# Patient Record
Sex: Female | Born: 1950 | Race: Asian | Hispanic: No | Marital: Married | Smoking: Former smoker
Health system: Southern US, Community
[De-identification: ages and names within clinical notes are randomized; demographics above are authoritative.]

## PROBLEM LIST (undated history)

## (undated) ENCOUNTER — Encounter

## (undated) ENCOUNTER — Telehealth

## (undated) ENCOUNTER — Ambulatory Visit: Payer: MEDICARE

## (undated) ENCOUNTER — Ambulatory Visit: Payer: Medicare (Managed Care)

## (undated) ENCOUNTER — Ambulatory Visit: Payer: MEDICARE | Attending: Nephrology | Primary: Nephrology

## (undated) ENCOUNTER — Ambulatory Visit

## (undated) ENCOUNTER — Other Ambulatory Visit

## (undated) ENCOUNTER — Encounter: Attending: Anesthesiology | Primary: Anesthesiology

## (undated) ENCOUNTER — Ambulatory Visit: Payer: MEDICARE | Attending: Registered" | Primary: Registered"

## (undated) ENCOUNTER — Encounter: Attending: Family | Primary: Family

## (undated) ENCOUNTER — Inpatient Hospital Stay

## (undated) ENCOUNTER — Ambulatory Visit: Payer: MEDICARE | Attending: Family | Primary: Family

## (undated) ENCOUNTER — Encounter: Attending: Internal Medicine | Primary: Internal Medicine

## (undated) ENCOUNTER — Ambulatory Visit: Payer: BLUE CROSS/BLUE SHIELD

## (undated) ENCOUNTER — Encounter: Attending: Surgery | Primary: Surgery

## (undated) ENCOUNTER — Ambulatory Visit: Attending: Nephrology | Primary: Nephrology

## (undated) ENCOUNTER — Ambulatory Visit: Attending: Surgery | Primary: Surgery

## (undated) ENCOUNTER — Encounter: Attending: Nephrology | Primary: Nephrology

## (undated) ENCOUNTER — Ambulatory Visit: Payer: MEDICARE | Attending: Surgery | Primary: Surgery

## (undated) ENCOUNTER — Encounter: Payer: MEDICARE | Attending: Internal Medicine | Primary: Internal Medicine

## (undated) DIAGNOSIS — E039 Hypothyroidism, unspecified: Secondary | ICD-10-CM

## (undated) DIAGNOSIS — I509 Heart failure, unspecified: Secondary | ICD-10-CM

## (undated) DIAGNOSIS — I2510 Atherosclerotic heart disease of native coronary artery without angina pectoris: Secondary | ICD-10-CM

## (undated) DIAGNOSIS — G629 Polyneuropathy, unspecified: Secondary | ICD-10-CM

## (undated) DIAGNOSIS — Z992 Dependence on renal dialysis: Secondary | ICD-10-CM

## (undated) DIAGNOSIS — Z9289 Personal history of other medical treatment: Secondary | ICD-10-CM

## (undated) DIAGNOSIS — I10 Essential (primary) hypertension: Secondary | ICD-10-CM

## (undated) DIAGNOSIS — B029 Zoster without complications: Secondary | ICD-10-CM

## (undated) DIAGNOSIS — Z9889 Other specified postprocedural states: Secondary | ICD-10-CM

## (undated) DIAGNOSIS — R251 Tremor, unspecified: Secondary | ICD-10-CM

## (undated) DIAGNOSIS — D649 Anemia, unspecified: Secondary | ICD-10-CM

## (undated) DIAGNOSIS — N189 Chronic kidney disease, unspecified: Secondary | ICD-10-CM

## (undated) DIAGNOSIS — E7800 Pure hypercholesterolemia, unspecified: Secondary | ICD-10-CM

## (undated) DIAGNOSIS — T82898A Other specified complication of vascular prosthetic devices, implants and grafts, initial encounter: Secondary | ICD-10-CM

## (undated) DIAGNOSIS — T8859XA Other complications of anesthesia, initial encounter: Secondary | ICD-10-CM

## (undated) DIAGNOSIS — R0600 Dyspnea, unspecified: Secondary | ICD-10-CM

## (undated) DIAGNOSIS — R112 Nausea with vomiting, unspecified: Secondary | ICD-10-CM

## (undated) DIAGNOSIS — E119 Type 2 diabetes mellitus without complications: Secondary | ICD-10-CM

## (undated) DIAGNOSIS — T4145XA Adverse effect of unspecified anesthetic, initial encounter: Secondary | ICD-10-CM

## (undated) DIAGNOSIS — R609 Edema, unspecified: Secondary | ICD-10-CM

## (undated) HISTORY — PX: ABDOMINAL HYSTERECTOMY: SHX81

## (undated) HISTORY — PX: TUBAL LIGATION: SHX77

## (undated) HISTORY — PX: CARDIAC CATHETERIZATION: SHX172

## (undated) HISTORY — PX: EYE SURGERY: SHX253

## (undated) HISTORY — PX: CORONARY ANGIOPLASTY: SHX604

## (undated) SURGERY — LEFT HEART CATH AND CORONARY ANGIOGRAPHY
Anesthesia: Moderate Sedation

---
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
## 2014-06-04 ENCOUNTER — Ambulatory Visit: Payer: Self-pay | Admitting: Internal Medicine

## 2014-08-06 DIAGNOSIS — E119 Type 2 diabetes mellitus without complications: Secondary | ICD-10-CM | POA: Diagnosis not present

## 2014-08-06 DIAGNOSIS — G63 Polyneuropathy in diseases classified elsewhere: Secondary | ICD-10-CM | POA: Diagnosis not present

## 2014-09-02 DIAGNOSIS — I10 Essential (primary) hypertension: Secondary | ICD-10-CM | POA: Diagnosis not present

## 2014-09-02 DIAGNOSIS — E782 Mixed hyperlipidemia: Secondary | ICD-10-CM | POA: Diagnosis not present

## 2014-09-02 DIAGNOSIS — E119 Type 2 diabetes mellitus without complications: Secondary | ICD-10-CM | POA: Diagnosis not present

## 2014-09-02 DIAGNOSIS — D6489 Other specified anemias: Secondary | ICD-10-CM | POA: Diagnosis not present

## 2014-09-09 DIAGNOSIS — I10 Essential (primary) hypertension: Secondary | ICD-10-CM | POA: Diagnosis not present

## 2014-09-09 DIAGNOSIS — E119 Type 2 diabetes mellitus without complications: Secondary | ICD-10-CM | POA: Diagnosis not present

## 2014-09-09 DIAGNOSIS — E782 Mixed hyperlipidemia: Secondary | ICD-10-CM | POA: Diagnosis not present

## 2014-10-02 DIAGNOSIS — D649 Anemia, unspecified: Secondary | ICD-10-CM | POA: Diagnosis not present

## 2014-10-29 ENCOUNTER — Ambulatory Visit
Admit: 2014-10-29 | Disposition: A | Discharge status: Home / Self Care | Payer: Self-pay | Attending: Gastroenterology | Admitting: Gastroenterology

## 2014-10-29 DIAGNOSIS — Z87891 Personal history of nicotine dependence: Secondary | ICD-10-CM | POA: Diagnosis not present

## 2014-10-29 DIAGNOSIS — D122 Benign neoplasm of ascending colon: Secondary | ICD-10-CM | POA: Diagnosis not present

## 2014-10-29 DIAGNOSIS — Z79899 Other long term (current) drug therapy: Secondary | ICD-10-CM | POA: Diagnosis not present

## 2014-10-29 DIAGNOSIS — Z90710 Acquired absence of both cervix and uterus: Secondary | ICD-10-CM | POA: Diagnosis not present

## 2014-10-29 DIAGNOSIS — D649 Anemia, unspecified: Secondary | ICD-10-CM | POA: Diagnosis not present

## 2014-10-29 DIAGNOSIS — E118 Type 2 diabetes mellitus with unspecified complications: Secondary | ICD-10-CM | POA: Diagnosis not present

## 2014-10-29 DIAGNOSIS — D125 Benign neoplasm of sigmoid colon: Secondary | ICD-10-CM | POA: Diagnosis not present

## 2014-10-29 DIAGNOSIS — D124 Benign neoplasm of descending colon: Secondary | ICD-10-CM | POA: Diagnosis not present

## 2014-10-29 DIAGNOSIS — K635 Polyp of colon: Secondary | ICD-10-CM | POA: Diagnosis not present

## 2014-10-29 DIAGNOSIS — Z880 Allergy status to penicillin: Secondary | ICD-10-CM | POA: Diagnosis not present

## 2014-11-16 LAB — SURGICAL PATHOLOGY

## 2014-11-24 DIAGNOSIS — E119 Type 2 diabetes mellitus without complications: Secondary | ICD-10-CM | POA: Diagnosis not present

## 2014-11-24 DIAGNOSIS — E782 Mixed hyperlipidemia: Secondary | ICD-10-CM | POA: Diagnosis not present

## 2014-11-24 DIAGNOSIS — I10 Essential (primary) hypertension: Secondary | ICD-10-CM | POA: Diagnosis not present

## 2014-12-01 DIAGNOSIS — E119 Type 2 diabetes mellitus without complications: Secondary | ICD-10-CM | POA: Diagnosis not present

## 2014-12-01 DIAGNOSIS — E782 Mixed hyperlipidemia: Secondary | ICD-10-CM | POA: Diagnosis not present

## 2014-12-01 DIAGNOSIS — I10 Essential (primary) hypertension: Secondary | ICD-10-CM | POA: Diagnosis not present

## 2015-03-31 DIAGNOSIS — I10 Essential (primary) hypertension: Secondary | ICD-10-CM | POA: Diagnosis not present

## 2015-03-31 DIAGNOSIS — E782 Mixed hyperlipidemia: Secondary | ICD-10-CM | POA: Diagnosis not present

## 2015-03-31 DIAGNOSIS — E119 Type 2 diabetes mellitus without complications: Secondary | ICD-10-CM | POA: Diagnosis not present

## 2015-04-05 DIAGNOSIS — I10 Essential (primary) hypertension: Secondary | ICD-10-CM | POA: Diagnosis not present

## 2015-04-05 DIAGNOSIS — Z0000 Encounter for general adult medical examination without abnormal findings: Secondary | ICD-10-CM | POA: Diagnosis not present

## 2015-04-05 DIAGNOSIS — N183 Chronic kidney disease, stage 3 (moderate): Secondary | ICD-10-CM | POA: Diagnosis not present

## 2015-04-05 DIAGNOSIS — E119 Type 2 diabetes mellitus without complications: Secondary | ICD-10-CM | POA: Diagnosis not present

## 2015-05-03 DIAGNOSIS — N184 Chronic kidney disease, stage 4 (severe): Secondary | ICD-10-CM | POA: Diagnosis not present

## 2015-05-03 DIAGNOSIS — I10 Essential (primary) hypertension: Secondary | ICD-10-CM | POA: Diagnosis not present

## 2015-05-03 DIAGNOSIS — E785 Hyperlipidemia, unspecified: Secondary | ICD-10-CM | POA: Diagnosis not present

## 2015-05-03 DIAGNOSIS — E1122 Type 2 diabetes mellitus with diabetic chronic kidney disease: Secondary | ICD-10-CM | POA: Diagnosis not present

## 2015-05-03 DIAGNOSIS — D631 Anemia in chronic kidney disease: Secondary | ICD-10-CM | POA: Diagnosis not present

## 2015-05-05 ENCOUNTER — Other Ambulatory Visit: Payer: Self-pay | Admitting: Nephrology

## 2015-05-05 DIAGNOSIS — N184 Chronic kidney disease, stage 4 (severe): Secondary | ICD-10-CM

## 2015-05-11 ENCOUNTER — Ambulatory Visit
Admission: RE | Admit: 2015-05-11 | Discharge: 2015-05-11 | Disposition: A | Discharge status: Home / Self Care | Payer: Commercial Managed Care - HMO | Source: Ambulatory Visit | Attending: Nephrology | Admitting: Nephrology

## 2015-05-11 DIAGNOSIS — N184 Chronic kidney disease, stage 4 (severe): Secondary | ICD-10-CM | POA: Diagnosis not present

## 2015-05-11 DIAGNOSIS — N189 Chronic kidney disease, unspecified: Secondary | ICD-10-CM | POA: Diagnosis not present

## 2015-05-17 DIAGNOSIS — D631 Anemia in chronic kidney disease: Secondary | ICD-10-CM | POA: Diagnosis not present

## 2015-05-17 DIAGNOSIS — R809 Proteinuria, unspecified: Secondary | ICD-10-CM | POA: Diagnosis not present

## 2015-05-17 DIAGNOSIS — I10 Essential (primary) hypertension: Secondary | ICD-10-CM | POA: Diagnosis not present

## 2015-05-17 DIAGNOSIS — E1122 Type 2 diabetes mellitus with diabetic chronic kidney disease: Secondary | ICD-10-CM | POA: Diagnosis not present

## 2015-05-17 DIAGNOSIS — N183 Chronic kidney disease, stage 3 (moderate): Secondary | ICD-10-CM | POA: Diagnosis not present

## 2015-05-24 ENCOUNTER — Other Ambulatory Visit: Payer: Self-pay | Admitting: Nephrology

## 2015-05-24 DIAGNOSIS — N184 Chronic kidney disease, stage 4 (severe): Secondary | ICD-10-CM

## 2015-05-25 ENCOUNTER — Observation Stay
Admission: AD | Admit: 2015-05-25 | Discharge: 2015-05-26 | Disposition: A | Discharge status: Home / Self Care | Payer: Commercial Managed Care - HMO | Source: Ambulatory Visit | Attending: Nephrology | Admitting: Nephrology

## 2015-05-25 DIAGNOSIS — E785 Hyperlipidemia, unspecified: Secondary | ICD-10-CM | POA: Insufficient documentation

## 2015-05-25 DIAGNOSIS — Z87891 Personal history of nicotine dependence: Secondary | ICD-10-CM | POA: Diagnosis not present

## 2015-05-25 DIAGNOSIS — Z880 Allergy status to penicillin: Secondary | ICD-10-CM | POA: Diagnosis not present

## 2015-05-25 DIAGNOSIS — I129 Hypertensive chronic kidney disease with stage 1 through stage 4 chronic kidney disease, or unspecified chronic kidney disease: Secondary | ICD-10-CM | POA: Insufficient documentation

## 2015-05-25 DIAGNOSIS — E1140 Type 2 diabetes mellitus with diabetic neuropathy, unspecified: Secondary | ICD-10-CM | POA: Insufficient documentation

## 2015-05-25 DIAGNOSIS — N179 Acute kidney failure, unspecified: Secondary | ICD-10-CM | POA: Diagnosis not present

## 2015-05-25 DIAGNOSIS — Z7982 Long term (current) use of aspirin: Secondary | ICD-10-CM | POA: Insufficient documentation

## 2015-05-25 DIAGNOSIS — Z794 Long term (current) use of insulin: Secondary | ICD-10-CM | POA: Insufficient documentation

## 2015-05-25 DIAGNOSIS — E1122 Type 2 diabetes mellitus with diabetic chronic kidney disease: Secondary | ICD-10-CM | POA: Diagnosis not present

## 2015-05-25 DIAGNOSIS — R319 Hematuria, unspecified: Secondary | ICD-10-CM | POA: Insufficient documentation

## 2015-05-25 DIAGNOSIS — Z888 Allergy status to other drugs, medicaments and biological substances status: Secondary | ICD-10-CM | POA: Diagnosis not present

## 2015-05-25 DIAGNOSIS — R809 Proteinuria, unspecified: Secondary | ICD-10-CM

## 2015-05-25 DIAGNOSIS — Z90710 Acquired absence of both cervix and uterus: Secondary | ICD-10-CM | POA: Diagnosis not present

## 2015-05-25 DIAGNOSIS — N184 Chronic kidney disease, stage 4 (severe): Secondary | ICD-10-CM | POA: Insufficient documentation

## 2015-05-25 DIAGNOSIS — E559 Vitamin D deficiency, unspecified: Secondary | ICD-10-CM | POA: Insufficient documentation

## 2015-05-25 DIAGNOSIS — I10 Essential (primary) hypertension: Secondary | ICD-10-CM | POA: Diagnosis not present

## 2015-05-25 DIAGNOSIS — N183 Chronic kidney disease, stage 3 (moderate): Secondary | ICD-10-CM | POA: Diagnosis not present

## 2015-05-25 DIAGNOSIS — Z79899 Other long term (current) drug therapy: Secondary | ICD-10-CM | POA: Diagnosis not present

## 2015-05-25 DIAGNOSIS — N189 Chronic kidney disease, unspecified: Secondary | ICD-10-CM | POA: Diagnosis not present

## 2015-05-25 DIAGNOSIS — D631 Anemia in chronic kidney disease: Secondary | ICD-10-CM | POA: Diagnosis not present

## 2015-05-25 DIAGNOSIS — Z23 Encounter for immunization: Secondary | ICD-10-CM | POA: Diagnosis not present

## 2015-05-25 HISTORY — DX: Type 2 diabetes mellitus without complications: E11.9

## 2015-05-25 HISTORY — DX: Chronic kidney disease, unspecified: N18.9

## 2015-05-25 LAB — COMPREHENSIVE METABOLIC PANEL
ALK PHOS: 77 U/L (ref 38–126)
ALT: 20 U/L (ref 14–54)
AST: 18 U/L (ref 15–41)
Albumin: 3 g/dL — ABNORMAL LOW (ref 3.5–5.0)
Anion gap: 6 (ref 5–15)
BILIRUBIN TOTAL: 0.3 mg/dL (ref 0.3–1.2)
BUN: 33 mg/dL — AB (ref 6–20)
CALCIUM: 9.5 mg/dL (ref 8.9–10.3)
CO2: 26 mmol/L (ref 22–32)
CREATININE: 2.47 mg/dL — AB (ref 0.44–1.00)
Chloride: 105 mmol/L (ref 101–111)
GFR calc Af Amer: 22 mL/min — ABNORMAL LOW (ref >60)
GFR calc non Af Amer: 19 mL/min — ABNORMAL LOW (ref >60)
Glucose, Bld: 281 mg/dL — ABNORMAL HIGH (ref 65–99)
Potassium: 4.6 mmol/L (ref 3.5–5.1)
Sodium: 137 mmol/L (ref 135–145)
Total Protein: 6.2 g/dL — ABNORMAL LOW (ref 6.5–8.1)

## 2015-05-25 LAB — URINALYSIS COMPLETE WITH MICROSCOPIC (ARMC ONLY)
Bilirubin Urine: NEGATIVE
Glucose, UA: >500 mg/dL — AB
Ketones, ur: NEGATIVE mg/dL
LEUKOCYTES UA: NEGATIVE
Nitrite: NEGATIVE
PROTEIN: 100 mg/dL — AB
SPECIFIC GRAVITY, URINE: 1.005 (ref 1.005–1.030)
pH: 6 (ref 5.0–8.0)

## 2015-05-25 LAB — PROTEIN / CREATININE RATIO, URINE
Creatinine, Urine: 22 mg/dL
Protein Creatinine Ratio: 8.59 mg/mg{Cre} — ABNORMAL HIGH (ref 0.00–0.15)
Total Protein, Urine: 189 mg/dL

## 2015-05-25 LAB — GLUCOSE, CAPILLARY
Glucose-Capillary: 233 mg/dL — ABNORMAL HIGH (ref 65–99)
Glucose-Capillary: 125 mg/dL — ABNORMAL HIGH (ref 65–99)
Glucose-Capillary: 252 mg/dL — ABNORMAL HIGH (ref 65–99)

## 2015-05-25 LAB — CBC WITH DIFFERENTIAL/PLATELET
BASOS PCT: 1 %
Basophils Absolute: 0 10*3/uL (ref 0–0.1)
EOS PCT: 2 %
Eosinophils Absolute: 0.1 10*3/uL (ref 0–0.7)
HCT: 30.1 % — ABNORMAL LOW (ref 35.0–47.0)
Hemoglobin: 10.3 g/dL — ABNORMAL LOW (ref 12.0–16.0)
Lymphocytes Relative: 24 %
Lymphs Abs: 1.5 10*3/uL (ref 1.0–3.6)
MCH: 29.6 pg (ref 26.0–34.0)
MCHC: 34.4 g/dL (ref 32.0–36.0)
MCV: 86.1 fL (ref 80.0–100.0)
MONO ABS: 0.3 10*3/uL (ref 0.2–0.9)
MONOS PCT: 5 %
NEUTROS ABS: 4.3 10*3/uL (ref 1.4–6.5)
Neutrophils Relative %: 68 %
Platelets: 203 10*3/uL (ref 150–440)
RBC: 3.49 MIL/uL — ABNORMAL LOW (ref 3.80–5.20)
RDW: 14 % (ref 11.5–14.5)
WBC: 6.3 10*3/uL (ref 3.6–11.0)

## 2015-05-25 LAB — PROTIME-INR
INR: 0.89
PROTHROMBIN TIME: 12.3 s (ref 11.4–15.0)

## 2015-05-25 LAB — TYPE AND SCREEN
ABO/RH(D): A POS
ANTIBODY SCREEN: NEGATIVE

## 2015-05-25 LAB — ABO/RH: ABO/RH(D): A POS

## 2015-05-25 MED ORDER — FENTANYL CITRATE (PF) 100 MCG/2ML IJ SOLN
INTRAMUSCULAR | Status: AC | PRN
  Administered 2015-05-25: 50 ug via INTRAVENOUS

## 2015-05-25 MED ORDER — HYDROCODONE-ACETAMINOPHEN 5-325 MG PO TABS: 1.0000 | ORAL_TABLET | ORAL | Status: DC | PRN

## 2015-05-25 MED ORDER — INSULIN ASPART 100 UNIT/ML ~~LOC~~ SOLN
0.0000 [IU] | Freq: Every day | SUBCUTANEOUS | Status: DC
  Administered 2015-05-25: 3 [IU] via SUBCUTANEOUS
  Filled 2015-05-25: qty 3

## 2015-05-25 MED ORDER — INFLUENZA VAC SPLIT QUAD 0.5 ML IM SUSY
0.5000 mL | PREFILLED_SYRINGE | INTRAMUSCULAR | Status: AC
  Administered 2015-05-26: 0.5 mL via INTRAMUSCULAR
  Filled 2015-05-25: qty 0.5

## 2015-05-25 MED ORDER — HYDRALAZINE HCL 20 MG/ML IJ SOLN
INTRAMUSCULAR | Status: AC
Start: 2015-05-25 — End: 2015-05-25
  Administered 2015-05-25: 17:00:00
  Filled 2015-05-25: qty 2

## 2015-05-25 MED ORDER — MIDAZOLAM HCL 5 MG/5ML IJ SOLN
INTRAMUSCULAR | Status: AC
  Administered 2015-05-25: 17:00:00
  Filled 2015-05-25: qty 5

## 2015-05-25 MED ORDER — MIDAZOLAM HCL 5 MG/5ML IJ SOLN
INTRAMUSCULAR | Status: AC | PRN
  Administered 2015-05-25: 1 mg via INTRAVENOUS

## 2015-05-25 MED ORDER — SODIUM CHLORIDE 0.9 % IV SOLN
INTRAVENOUS | Status: DC
Start: 2015-05-25 — End: 2015-05-26
  Administered 2015-05-25: 11:00:00 via INTRAVENOUS

## 2015-05-25 MED ORDER — HYDRALAZINE HCL 20 MG/ML IJ SOLN
10.0000 mg | INTRAMUSCULAR | Status: DC | PRN
  Administered 2015-05-25: 10 mg via INTRAVENOUS

## 2015-05-25 MED ORDER — INSULIN ASPART 100 UNIT/ML ~~LOC~~ SOLN
0.0000 [IU] | Freq: Three times a day (TID) | SUBCUTANEOUS | Status: DC
  Administered 2015-05-25: 3 [IU] via SUBCUTANEOUS
  Administered 2015-05-25: 1 [IU] via SUBCUTANEOUS
  Administered 2015-05-26: 2 [IU] via SUBCUTANEOUS
  Filled 2015-05-25: qty 1
  Filled 2015-05-25 (×2): qty 2

## 2015-05-25 MED ORDER — FENTANYL CITRATE (PF) 100 MCG/2ML IJ SOLN
INTRAMUSCULAR | Status: AC
  Administered 2015-05-25: 17:00:00
  Filled 2015-05-25: qty 4

## 2015-05-25 MED ORDER — HYDRALAZINE HCL 25 MG PO TABS: 25.0000 mg | ORAL_TABLET | Freq: Once | ORAL | Status: DC

## 2015-05-25 MED ORDER — HYDRALAZINE HCL 20 MG/ML IJ SOLN
10.0000 mg | Freq: Once | INTRAMUSCULAR | Status: AC
  Administered 2015-05-25: 10 mg via INTRAVENOUS

## 2015-05-25 NOTE — Progress Notes (Signed)
Nurse report called to nurse emily 2C, pt sleepy arousable gcs 15, left flank w/bandaide no swelling or bleeding, pt prone position denies pain, vss PIV left hand

## 2015-05-25 NOTE — Progress Notes (Signed)
Dr. Juleen China notified of diabetes and recent uncontrolled blood sugars. ACHS blood sugar checks and low sliding scale ordered. Will continue to monitor,  Almedia Balls, RN

## 2015-05-25 NOTE — Progress Notes (Addendum)
  Subjective:  Patient presents for U.S guided Kidney biopsy Going well No acute c/o    Objective:  Vital signs in last 24 hours:  Temp:  [98.4 F (36.9 C)] 98.4 F (36.9 C) (11/01 0936) Pulse Rate:  [77] 77 (11/01 1059) Resp:  [14] 14 (11/01 0936) BP: (148-180)/(74-83) 148/83 mmHg (11/01 1059) SpO2:  [100 %] 100 % (11/01 0936) Weight:  [66.543 kg (146 lb 11.2 oz)] 66.543 kg (146 lb 11.2 oz) (11/01 1105)  Weight change:  Filed Weights   05/25/15 1105  Weight: 66.543 kg (146 lb 11.2 oz)    Intake/Output:    Intake/Output Summary (Last 24 hours) at 05/25/15 1240 Last data filed at 05/25/15 1157  Gross per 24 hour  Intake      0 ml  Output    175 ml  Net   -175 ml     Physical Exam: General: NAD, laying in bed  HEENT Anicteric, moist oral mucus membranes  Neck supple  Pulm/lungs Normal effort, mild basilar rhonchi  CVS/Heart Regular, soft systolic murmur  Abdomen:  Soft, Nen tender  Extremities: + peripheral edema  Neurologic: Alert, oriented  Skin: No acute rashes  Access:        Basic Metabolic Panel:   Recent Labs Lab 05/25/15 1008  NA 137  K 4.6  CL 105  CO2 26  GLUCOSE 281*  BUN 33*  CREATININE 2.47*  CALCIUM 9.5     CBC:  Recent Labs Lab 05/25/15 1008  WBC 6.3  NEUTROABS 4.3  HGB 10.3*  HCT 30.1*  MCV 86.1  PLT 203      Microbiology:  No results found for this or any previous visit (from the past 720 hour(s)).  Coagulation Studies:  Recent Labs  05/25/15 1008  LABPROT 12.3  INR 0.89    Urinalysis:  Recent Labs  05/25/15 1155  COLORURINE STRAW*  LABSPEC 1.005  PHURINE 6.0  GLUCOSEU >500*  HGBUR 1+*  BILIRUBINUR NEGATIVE  KETONESUR NEGATIVE  PROTEINUR 100*  NITRITE NEGATIVE  LEUKOCYTESUR NEGATIVE      Imaging: No results found.   Medications:   . sodium chloride 10 mL/hr at 05/25/15 1100   . insulin aspart  0-5 Units Subcutaneous QHS  . insulin aspart  0-9 Units Subcutaneous TID WC    hydrALAZINE  Assessment/ Plan:  64 y.o. Asian ([place]) female with diabetes mellitus 2, diabetic neuropathy, anemia, hypertension, hyperlipidemia with acute renal failure on chronic kidney disease stage III with nephrotic range proteinuria and hematuria.   Plan: Kidney biopsy today (radiologist) Hydralazine x 1 to improve BP control     LOS:  Bijon Mineer 11/1/201612:40 PM

## 2015-05-25 NOTE — Procedures (Signed)
Interventional Radiology Procedure Note  Procedure:  Ultrasound guided core biopsy of left kidney  Complications:  None  Estimated Blood Loss: < 10 mL  16 G core biopsy x 3 of LP of left kidney.  No immediate complications.  Venetia Night. Kathlene Cote, M.D Pager:  [phone]

## 2015-05-25 NOTE — Sedation Documentation (Signed)
BP 187/88. Hydralazine 10 mg IV given.  Dr. Kathlene Cote notified.

## 2015-05-26 DIAGNOSIS — E1140 Type 2 diabetes mellitus with diabetic neuropathy, unspecified: Secondary | ICD-10-CM | POA: Diagnosis not present

## 2015-05-26 DIAGNOSIS — N184 Chronic kidney disease, stage 4 (severe): Secondary | ICD-10-CM | POA: Diagnosis not present

## 2015-05-26 DIAGNOSIS — E785 Hyperlipidemia, unspecified: Secondary | ICD-10-CM | POA: Diagnosis not present

## 2015-05-26 DIAGNOSIS — D631 Anemia in chronic kidney disease: Secondary | ICD-10-CM | POA: Diagnosis not present

## 2015-05-26 DIAGNOSIS — E1122 Type 2 diabetes mellitus with diabetic chronic kidney disease: Secondary | ICD-10-CM | POA: Diagnosis not present

## 2015-05-26 DIAGNOSIS — N179 Acute kidney failure, unspecified: Secondary | ICD-10-CM | POA: Diagnosis not present

## 2015-05-26 DIAGNOSIS — I129 Hypertensive chronic kidney disease with stage 1 through stage 4 chronic kidney disease, or unspecified chronic kidney disease: Secondary | ICD-10-CM | POA: Diagnosis not present

## 2015-05-26 DIAGNOSIS — Z87891 Personal history of nicotine dependence: Secondary | ICD-10-CM | POA: Diagnosis not present

## 2015-05-26 DIAGNOSIS — Z794 Long term (current) use of insulin: Secondary | ICD-10-CM | POA: Diagnosis not present

## 2015-05-26 DIAGNOSIS — R809 Proteinuria, unspecified: Secondary | ICD-10-CM | POA: Diagnosis not present

## 2015-05-26 LAB — HEMOGLOBIN: HEMOGLOBIN: 9.1 g/dL — AB (ref 12.0–16.0)

## 2015-05-26 LAB — GLUCOSE, CAPILLARY: Glucose-Capillary: 176 mg/dL — ABNORMAL HIGH (ref 65–99)

## 2015-05-26 MED ORDER — FUROSEMIDE 40 MG PO TABS: 40.0000 mg | ORAL_TABLET | Freq: Two times a day (BID) | ORAL | Status: DC

## 2015-05-26 MED ORDER — ASPIRIN EC 81 MG PO TBEC: 81.0000 mg | DELAYED_RELEASE_TABLET | Freq: Every day | ORAL | Status: AC

## 2015-05-26 NOTE — Discharge Instructions (Signed)
No Lifting over 10 lbs for 10 days RESUME aspirin after NOV 15 May take Tylenol for pain

## 2015-05-26 NOTE — Discharge Summary (Signed)
  Patient underwent Kidney Biopsy yesterday Doing well today No c/o No pain No blood in urine  VS: 136/49  71  82  16  Room air 95% Gen: NAD Heent: anicteric moist mucus membranes Cvs: regular, no rub pulm: normal effort, clear Abdo: aoft, non tender Extr: + edema Neuro: A & O  Labs reviewed  A/P 1. Proteinuria 2. ARF 3. CKD st 3 4. DM-2 with CKD 5. Peripheral edema  Plan: F/u outpatient for Biopsy results We had a long discussion about dietary restrictions  Patient's husband reports that she is not compliant with Diet She was on invokana 2 yrs ago. Stopped about a year ago D/c home

## 2015-06-07 LAB — SURGICAL PATHOLOGY

## 2015-06-08 ENCOUNTER — Encounter: Payer: Self-pay | Admitting: Nephrology

## 2015-06-15 DIAGNOSIS — N184 Chronic kidney disease, stage 4 (severe): Secondary | ICD-10-CM | POA: Diagnosis not present

## 2015-06-15 DIAGNOSIS — R809 Proteinuria, unspecified: Secondary | ICD-10-CM | POA: Diagnosis not present

## 2015-06-15 DIAGNOSIS — I10 Essential (primary) hypertension: Secondary | ICD-10-CM | POA: Diagnosis not present

## 2015-06-15 DIAGNOSIS — E1122 Type 2 diabetes mellitus with diabetic chronic kidney disease: Secondary | ICD-10-CM | POA: Diagnosis not present

## 2015-06-16 DIAGNOSIS — E113493 Type 2 diabetes mellitus with severe nonproliferative diabetic retinopathy without macular edema, bilateral: Secondary | ICD-10-CM | POA: Diagnosis not present

## 2015-06-28 DIAGNOSIS — E785 Hyperlipidemia, unspecified: Secondary | ICD-10-CM | POA: Diagnosis not present

## 2015-06-28 DIAGNOSIS — I10 Essential (primary) hypertension: Secondary | ICD-10-CM | POA: Diagnosis not present

## 2015-06-28 DIAGNOSIS — E1122 Type 2 diabetes mellitus with diabetic chronic kidney disease: Secondary | ICD-10-CM | POA: Diagnosis not present

## 2015-06-28 DIAGNOSIS — R809 Proteinuria, unspecified: Secondary | ICD-10-CM | POA: Diagnosis not present

## 2015-06-28 DIAGNOSIS — N184 Chronic kidney disease, stage 4 (severe): Secondary | ICD-10-CM | POA: Diagnosis not present

## 2015-06-28 DIAGNOSIS — D631 Anemia in chronic kidney disease: Secondary | ICD-10-CM | POA: Diagnosis not present

## 2015-07-02 DIAGNOSIS — N183 Chronic kidney disease, stage 3 (moderate): Secondary | ICD-10-CM | POA: Diagnosis not present

## 2015-07-02 DIAGNOSIS — I10 Essential (primary) hypertension: Secondary | ICD-10-CM | POA: Diagnosis not present

## 2015-07-02 DIAGNOSIS — Z0000 Encounter for general adult medical examination without abnormal findings: Secondary | ICD-10-CM | POA: Diagnosis not present

## 2015-07-02 DIAGNOSIS — Z794 Long term (current) use of insulin: Secondary | ICD-10-CM | POA: Diagnosis not present

## 2015-07-02 DIAGNOSIS — E119 Type 2 diabetes mellitus without complications: Secondary | ICD-10-CM | POA: Diagnosis not present

## 2015-07-09 DIAGNOSIS — Z23 Encounter for immunization: Secondary | ICD-10-CM | POA: Diagnosis not present

## 2015-07-09 DIAGNOSIS — E1165 Type 2 diabetes mellitus with hyperglycemia: Secondary | ICD-10-CM | POA: Diagnosis not present

## 2015-07-09 DIAGNOSIS — L298 Other pruritus: Secondary | ICD-10-CM | POA: Diagnosis not present

## 2015-07-09 DIAGNOSIS — N185 Chronic kidney disease, stage 5: Secondary | ICD-10-CM | POA: Insufficient documentation

## 2015-07-09 DIAGNOSIS — N184 Chronic kidney disease, stage 4 (severe): Secondary | ICD-10-CM | POA: Diagnosis not present

## 2015-07-09 DIAGNOSIS — E782 Mixed hyperlipidemia: Secondary | ICD-10-CM | POA: Diagnosis not present

## 2015-07-09 DIAGNOSIS — E118 Type 2 diabetes mellitus with unspecified complications: Secondary | ICD-10-CM | POA: Diagnosis not present

## 2015-07-09 DIAGNOSIS — Z794 Long term (current) use of insulin: Secondary | ICD-10-CM | POA: Diagnosis not present

## 2015-07-12 DIAGNOSIS — N184 Chronic kidney disease, stage 4 (severe): Secondary | ICD-10-CM | POA: Diagnosis not present

## 2015-07-12 DIAGNOSIS — R809 Proteinuria, unspecified: Secondary | ICD-10-CM | POA: Diagnosis not present

## 2015-07-12 DIAGNOSIS — I129 Hypertensive chronic kidney disease with stage 1 through stage 4 chronic kidney disease, or unspecified chronic kidney disease: Secondary | ICD-10-CM | POA: Diagnosis not present

## 2015-07-12 DIAGNOSIS — E1122 Type 2 diabetes mellitus with diabetic chronic kidney disease: Secondary | ICD-10-CM | POA: Diagnosis not present

## 2015-08-09 DIAGNOSIS — E113492 Type 2 diabetes mellitus with severe nonproliferative diabetic retinopathy without macular edema, left eye: Secondary | ICD-10-CM | POA: Diagnosis not present

## 2015-08-09 DIAGNOSIS — E113591 Type 2 diabetes mellitus with proliferative diabetic retinopathy without macular edema, right eye: Secondary | ICD-10-CM | POA: Diagnosis not present

## 2015-08-30 DIAGNOSIS — N184 Chronic kidney disease, stage 4 (severe): Secondary | ICD-10-CM | POA: Diagnosis not present

## 2015-08-30 DIAGNOSIS — D631 Anemia in chronic kidney disease: Secondary | ICD-10-CM | POA: Diagnosis not present

## 2015-08-30 DIAGNOSIS — E1122 Type 2 diabetes mellitus with diabetic chronic kidney disease: Secondary | ICD-10-CM | POA: Diagnosis not present

## 2015-08-30 DIAGNOSIS — R809 Proteinuria, unspecified: Secondary | ICD-10-CM | POA: Diagnosis not present

## 2015-08-30 DIAGNOSIS — I129 Hypertensive chronic kidney disease with stage 1 through stage 4 chronic kidney disease, or unspecified chronic kidney disease: Secondary | ICD-10-CM | POA: Diagnosis not present

## 2015-09-01 DIAGNOSIS — E113591 Type 2 diabetes mellitus with proliferative diabetic retinopathy without macular edema, right eye: Secondary | ICD-10-CM | POA: Diagnosis not present

## 2015-09-13 DIAGNOSIS — E663 Overweight: Secondary | ICD-10-CM | POA: Diagnosis not present

## 2015-09-13 DIAGNOSIS — E782 Mixed hyperlipidemia: Secondary | ICD-10-CM | POA: Diagnosis not present

## 2015-09-13 DIAGNOSIS — E113412 Type 2 diabetes mellitus with severe nonproliferative diabetic retinopathy with macular edema, left eye: Secondary | ICD-10-CM | POA: Diagnosis not present

## 2015-09-13 DIAGNOSIS — E1121 Type 2 diabetes mellitus with diabetic nephropathy: Secondary | ICD-10-CM | POA: Diagnosis not present

## 2015-09-13 DIAGNOSIS — E1142 Type 2 diabetes mellitus with diabetic polyneuropathy: Secondary | ICD-10-CM | POA: Insufficient documentation

## 2015-09-13 DIAGNOSIS — Z794 Long term (current) use of insulin: Secondary | ICD-10-CM

## 2015-09-13 DIAGNOSIS — E1165 Type 2 diabetes mellitus with hyperglycemia: Secondary | ICD-10-CM | POA: Diagnosis not present

## 2015-09-16 ENCOUNTER — Inpatient Hospital Stay: Payer: Commercial Managed Care - HMO | Admitting: Oncology

## 2015-09-23 DIAGNOSIS — E782 Mixed hyperlipidemia: Secondary | ICD-10-CM | POA: Diagnosis not present

## 2015-09-23 DIAGNOSIS — Z794 Long term (current) use of insulin: Secondary | ICD-10-CM | POA: Diagnosis not present

## 2015-09-23 DIAGNOSIS — E118 Type 2 diabetes mellitus with unspecified complications: Secondary | ICD-10-CM | POA: Diagnosis not present

## 2015-09-23 DIAGNOSIS — Z23 Encounter for immunization: Secondary | ICD-10-CM | POA: Diagnosis not present

## 2015-09-23 DIAGNOSIS — E1165 Type 2 diabetes mellitus with hyperglycemia: Secondary | ICD-10-CM | POA: Diagnosis not present

## 2015-09-23 DIAGNOSIS — L298 Other pruritus: Secondary | ICD-10-CM | POA: Diagnosis not present

## 2015-09-23 DIAGNOSIS — N185 Chronic kidney disease, stage 5: Secondary | ICD-10-CM | POA: Diagnosis not present

## 2015-09-30 DIAGNOSIS — Z794 Long term (current) use of insulin: Secondary | ICD-10-CM | POA: Diagnosis not present

## 2015-09-30 DIAGNOSIS — I10 Essential (primary) hypertension: Secondary | ICD-10-CM | POA: Diagnosis not present

## 2015-09-30 DIAGNOSIS — N185 Chronic kidney disease, stage 5: Secondary | ICD-10-CM | POA: Diagnosis not present

## 2015-09-30 DIAGNOSIS — E1122 Type 2 diabetes mellitus with diabetic chronic kidney disease: Secondary | ICD-10-CM | POA: Diagnosis not present

## 2015-09-30 DIAGNOSIS — G63 Polyneuropathy in diseases classified elsewhere: Secondary | ICD-10-CM | POA: Diagnosis not present

## 2015-10-04 ENCOUNTER — Inpatient Hospital Stay: Payer: Commercial Managed Care - HMO | Attending: Oncology | Admitting: Oncology

## 2015-10-04 ENCOUNTER — Inpatient Hospital Stay: Payer: Commercial Managed Care - HMO

## 2015-10-04 VITALS — BP 159/76 | HR 83 | Temp 98.1°F | Resp 16 | Wt 148.6 lb

## 2015-10-04 DIAGNOSIS — D649 Anemia, unspecified: Secondary | ICD-10-CM

## 2015-10-04 DIAGNOSIS — Z79899 Other long term (current) drug therapy: Secondary | ICD-10-CM

## 2015-10-04 DIAGNOSIS — I129 Hypertensive chronic kidney disease with stage 1 through stage 4 chronic kidney disease, or unspecified chronic kidney disease: Secondary | ICD-10-CM

## 2015-10-04 DIAGNOSIS — D631 Anemia in chronic kidney disease: Secondary | ICD-10-CM | POA: Diagnosis not present

## 2015-10-04 DIAGNOSIS — Z87891 Personal history of nicotine dependence: Secondary | ICD-10-CM | POA: Diagnosis not present

## 2015-10-04 DIAGNOSIS — E1122 Type 2 diabetes mellitus with diabetic chronic kidney disease: Secondary | ICD-10-CM

## 2015-10-04 DIAGNOSIS — Z794 Long term (current) use of insulin: Secondary | ICD-10-CM

## 2015-10-04 DIAGNOSIS — N189 Chronic kidney disease, unspecified: Secondary | ICD-10-CM | POA: Diagnosis not present

## 2015-10-04 DIAGNOSIS — Z7982 Long term (current) use of aspirin: Secondary | ICD-10-CM

## 2015-10-04 LAB — IRON AND TIBC
IRON: 51 ug/dL (ref 28–170)
Saturation Ratios: 17 % (ref 10.4–31.8)
TIBC: 304 ug/dL (ref 250–450)
UIBC: 253 ug/dL

## 2015-10-04 LAB — CBC WITH DIFFERENTIAL/PLATELET
BASOS PCT: 1 %
Basophils Absolute: 0 10*3/uL (ref 0–0.1)
EOS ABS: 0.1 10*3/uL (ref 0–0.7)
EOS PCT: 2 %
HCT: 27.7 % — ABNORMAL LOW (ref 35.0–47.0)
HEMOGLOBIN: 9.4 g/dL — AB (ref 12.0–16.0)
Lymphocytes Relative: 23 %
Lymphs Abs: 1.4 10*3/uL (ref 1.0–3.6)
MCH: 29.3 pg (ref 26.0–34.0)
MCHC: 34 g/dL (ref 32.0–36.0)
MCV: 86 fL (ref 80.0–100.0)
MONOS PCT: 6 %
Monocytes Absolute: 0.4 10*3/uL (ref 0.2–0.9)
NEUTROS PCT: 68 %
Neutro Abs: 4.1 10*3/uL (ref 1.4–6.5)
PLATELETS: 177 10*3/uL (ref 150–440)
RBC: 3.22 MIL/uL — ABNORMAL LOW (ref 3.80–5.20)
RDW: 13.9 % (ref 11.5–14.5)
WBC: 6.1 10*3/uL (ref 3.6–11.0)

## 2015-10-04 LAB — FERRITIN: FERRITIN: 61 ng/mL (ref 11–307)

## 2015-10-04 LAB — FOLATE: FOLATE: 38 ng/mL (ref >5.9)

## 2015-10-04 LAB — VITAMIN B12: Vitamin B-12: 1266 pg/mL — ABNORMAL HIGH (ref 180–914)

## 2015-10-04 LAB — DAT, POLYSPECIFIC AHG (ARMC ONLY): POLYSPECIFIC AHG TEST: NEGATIVE

## 2015-10-04 LAB — LACTATE DEHYDROGENASE: LDH: 204 U/L — ABNORMAL HIGH (ref 98–192)

## 2015-10-04 NOTE — Progress Notes (Signed)
Patient referred by Dr. Juleen China for anemia.

## 2015-10-05 LAB — PROTEIN ELECTROPHORESIS, SERUM
A/G RATIO SPE: 1 (ref 0.7–1.7)
ALPHA-2-GLOBULIN: 0.6 g/dL (ref 0.4–1.0)
Albumin ELP: 2.9 g/dL (ref 2.9–4.4)
Alpha-1-Globulin: 0.2 g/dL (ref 0.0–0.4)
BETA GLOBULIN: 1.1 g/dL (ref 0.7–1.3)
Gamma Globulin: 0.9 g/dL (ref 0.4–1.8)
Globulin, Total: 2.8 g/dL (ref 2.2–3.9)
Total Protein ELP: 5.7 g/dL — ABNORMAL LOW (ref 6.0–8.5)

## 2015-10-05 LAB — HAPTOGLOBIN: HAPTOGLOBIN: 73 mg/dL (ref 34–200)

## 2015-10-06 DIAGNOSIS — E113511 Type 2 diabetes mellitus with proliferative diabetic retinopathy with macular edema, right eye: Secondary | ICD-10-CM | POA: Diagnosis not present

## 2015-10-07 DIAGNOSIS — I10 Essential (primary) hypertension: Secondary | ICD-10-CM | POA: Diagnosis not present

## 2015-10-10 NOTE — Progress Notes (Signed)
[facility]  Telephone:([phone]) [phone] [phone]) [phone]  ID: Bethany Lee OB: 07/23/1951  MR#: [id number]  [id number]  Patient Care Team: Tracie Harrier, MD as PCP - General (Internal Medicine)  CHIEF COMPLAINT:  Chief Complaint  Patient presents with  . New Evaluation    anemia    INTERVAL HISTORY: Patient is a 65 year old female with a long-standing history of chronic renal insufficiency who has not yet started dialysis. Routine blood work revealed a progressive anemia. She currently feels well and is asymptomatic. She denies any weakness or fatigue.  She has no neurologic complaints. She denies any recent fevers or illnesses. She has a good appetite and denies weight loss. She has no chest pain or shortness of breath. She denies any nausea, vomiting, constipation, or diarrhea. She has no melena or hematochezia. She has no urinary complaints. Patient feels at her baseline and offers no specific complaints today.  REVIEW OF SYSTEMS:   Review of Systems  Constitutional: Negative for fever and malaise/fatigue.  Respiratory: Negative.  Negative for cough.   Cardiovascular: Negative.  Negative for chest pain.  Gastrointestinal: Negative.  Negative for blood in stool and melena.  Genitourinary: Negative.   Musculoskeletal: Negative.   Neurological: Negative.  Negative for weakness.    As per HPI. Otherwise, a complete review of systems is negatve.  PAST MEDICAL HISTORY: Past Medical History  Diagnosis Date  . Diabetes mellitus without complication ([place])   . Chronic kidney disease     PAST SURGICAL HISTORY: No past surgical history on file.  FAMILY HISTORY: Reviewed and unchanged. No reported history of malignancy or chronic disease.     ADVANCED DIRECTIVES:    HEALTH MAINTENANCE: Social History  Substance Use Topics  . Smoking status: Former Research scientist (life sciences)  . Smokeless tobacco: Not on file  . Alcohol Use: No     Colonoscopy:  PAP:  Bone  density:  Lipid panel:  Allergies  Allergen Reactions  . Penicillins     Current Outpatient Prescriptions  Medication Sig Dispense Refill  . amLODipine (NORVASC) 5 MG tablet Take 5 mg by mouth daily.    Marland Kitchen aspirin EC 81 MG tablet Take 1 tablet (81 mg total) by mouth daily. Start taking after NOV 15 30 tablet 0  . furosemide (LASIX) 40 MG tablet Take 1 tablet (40 mg total) by mouth 2 (two) times daily with breakfast and lunch. Take as needed for Leg swelling 30 tablet 2  . gabapentin (NEURONTIN) 300 MG capsule Take 300 mg by mouth 3 (three) times daily.    . insulin aspart (NOVOLOG FLEXPEN) 100 UNIT/ML FlexPen     . lovastatin (MEVACOR) 20 MG tablet Take 20 mg by mouth at bedtime.    . Multiple Vitamins-Minerals (MULTIVITAMIN WITH MINERALS) tablet Take 1 tablet by mouth daily.    Marland Kitchen omega-3 acid ethyl esters (LOVAZA) 1 G capsule Take 1,200 mg by mouth 2 (two) times daily.    . Vitamin D, Ergocalciferol, (DRISDOL) 50000 UNITS CAPS capsule Take 50,000 Units by mouth every 7 (seven) days.     No current facility-administered medications for this visit.    OBJECTIVE: Filed Vitals:   10/04/15 0938  BP: 159/76  Pulse: 83  Temp: 98.1 F (36.7 C)  Resp: 16     Body mass index is 29.02 kg/(m^2).    ECOG FS:0 - Asymptomatic  General: Well-developed, well-nourished, no acute distress. Eyes: Pink conjunctiva, anicteric sclera. HEENT: Normocephalic, moist mucous membranes, clear oropharnyx. Lungs: Clear to auscultation bilaterally. Heart:  Regular rate and rhythm. No rubs, murmurs, or gallops. Abdomen: Soft, nontender, nondistended. No organomegaly noted, normoactive bowel sounds. Musculoskeletal: No edema, cyanosis, or clubbing. Neuro: Alert, answering all questions appropriately. Cranial nerves grossly intact. Skin: No rashes or petechiae noted. Psych: Normal affect. Lymphatics: No cervical, calvicular, axillary or inguinal LAD.   LAB RESULTS:  Lab Results  Component Value Date    NA 137 05/25/2015   K 4.6 05/25/2015   CL 105 05/25/2015   CO2 26 05/25/2015   GLUCOSE 281* 05/25/2015   BUN 33* 05/25/2015   CREATININE 2.47* 05/25/2015   CALCIUM 9.5 05/25/2015   PROT 6.2* 05/25/2015   ALBUMIN 3.0* 05/25/2015   AST 18 05/25/2015   ALT 20 05/25/2015   ALKPHOS 77 05/25/2015   BILITOT 0.3 05/25/2015   GFRNONAA 19* 05/25/2015   GFRAA 22* 05/25/2015    Lab Results  Component Value Date   WBC 6.1 10/04/2015   NEUTROABS 4.1 10/04/2015   HGB 9.4* 10/04/2015   HCT 27.7* 10/04/2015   MCV 86.0 10/04/2015   PLT 177 10/04/2015   Lab Results  Component Value Date   IRON 51 10/04/2015   TIBC 304 10/04/2015   IRONPCTSAT 17 10/04/2015    Lab Results  Component Value Date   FERRITIN 61 10/04/2015     STUDIES: No results found.  ASSESSMENT: Anemia secondary to chronic renal insufficiency.  PLAN:    1. Anemia: Patient's hemoglobin is below 10.0, therefore she will benefit from 40,000 units Procrit. The remainder of her blood work including iron stores as either negative or within normal limits. Return to clinic in 1 week to initiate Procrit. Patient will then return to clinic every 4 weeks for laboratory work and consideration of additional Procrit if her hemoglobin remains below 10.0. She will then return to clinic in 4 months with repeat laboratory work and further evaluation. 2. Chronic renal insufficiency: Patient's creatinine appears to be at her baseline. She has been instructed that if she ever requires dialysis, she likely no longer will require follow-up in the [facility] since she can receive her Procrit at dialysis. 3. Hypertension: Blood pressure mildly elevated today. Continue current medications as prescribed.  Patient expressed understanding and was in agreement with this plan. She also understands that She can call clinic at any time with any questions, concerns, or complaints.    Lloyd Huger, MD   10/10/2015 8:14 AM

## 2015-10-11 ENCOUNTER — Inpatient Hospital Stay: Payer: Commercial Managed Care - HMO

## 2015-10-11 VITALS — BP 126/48 | HR 96 | Temp 97.8°F | Resp 18

## 2015-10-11 DIAGNOSIS — E1121 Type 2 diabetes mellitus with diabetic nephropathy: Secondary | ICD-10-CM | POA: Diagnosis not present

## 2015-10-11 DIAGNOSIS — E113412 Type 2 diabetes mellitus with severe nonproliferative diabetic retinopathy with macular edema, left eye: Secondary | ICD-10-CM | POA: Diagnosis not present

## 2015-10-11 DIAGNOSIS — Z7982 Long term (current) use of aspirin: Secondary | ICD-10-CM | POA: Diagnosis not present

## 2015-10-11 DIAGNOSIS — N189 Chronic kidney disease, unspecified: Secondary | ICD-10-CM | POA: Diagnosis not present

## 2015-10-11 DIAGNOSIS — Z87891 Personal history of nicotine dependence: Secondary | ICD-10-CM | POA: Diagnosis not present

## 2015-10-11 DIAGNOSIS — N184 Chronic kidney disease, stage 4 (severe): Secondary | ICD-10-CM | POA: Diagnosis not present

## 2015-10-11 DIAGNOSIS — E1142 Type 2 diabetes mellitus with diabetic polyneuropathy: Secondary | ICD-10-CM | POA: Diagnosis not present

## 2015-10-11 DIAGNOSIS — Z794 Long term (current) use of insulin: Secondary | ICD-10-CM | POA: Diagnosis not present

## 2015-10-11 DIAGNOSIS — D631 Anemia in chronic kidney disease: Secondary | ICD-10-CM | POA: Diagnosis not present

## 2015-10-11 DIAGNOSIS — R809 Proteinuria, unspecified: Secondary | ICD-10-CM | POA: Diagnosis not present

## 2015-10-11 DIAGNOSIS — I120 Hypertensive chronic kidney disease with stage 5 chronic kidney disease or end stage renal disease: Secondary | ICD-10-CM | POA: Diagnosis not present

## 2015-10-11 DIAGNOSIS — N185 Chronic kidney disease, stage 5: Secondary | ICD-10-CM | POA: Diagnosis not present

## 2015-10-11 DIAGNOSIS — Z79899 Other long term (current) drug therapy: Secondary | ICD-10-CM | POA: Diagnosis not present

## 2015-10-11 DIAGNOSIS — I129 Hypertensive chronic kidney disease with stage 1 through stage 4 chronic kidney disease, or unspecified chronic kidney disease: Secondary | ICD-10-CM | POA: Diagnosis not present

## 2015-10-11 DIAGNOSIS — E1122 Type 2 diabetes mellitus with diabetic chronic kidney disease: Secondary | ICD-10-CM | POA: Diagnosis not present

## 2015-10-11 DIAGNOSIS — I10 Essential (primary) hypertension: Secondary | ICD-10-CM | POA: Diagnosis not present

## 2015-10-11 MED ORDER — EPOETIN ALFA 40000 UNIT/ML IJ SOLN
40000.0000 [IU] | Freq: Once | INTRAMUSCULAR | Status: AC
  Administered 2015-10-11: 40000 [IU] via SUBCUTANEOUS
  Filled 2015-10-11: qty 1

## 2015-11-01 ENCOUNTER — Other Ambulatory Visit: Payer: Self-pay

## 2015-11-01 ENCOUNTER — Ambulatory Visit: Payer: Self-pay

## 2015-11-16 ENCOUNTER — Other Ambulatory Visit: Payer: Self-pay | Admitting: Vascular Surgery

## 2015-11-16 DIAGNOSIS — I10 Essential (primary) hypertension: Secondary | ICD-10-CM | POA: Diagnosis not present

## 2015-11-16 DIAGNOSIS — E119 Type 2 diabetes mellitus without complications: Secondary | ICD-10-CM | POA: Diagnosis not present

## 2015-11-16 DIAGNOSIS — N185 Chronic kidney disease, stage 5: Secondary | ICD-10-CM | POA: Diagnosis not present

## 2015-11-16 DIAGNOSIS — E785 Hyperlipidemia, unspecified: Secondary | ICD-10-CM | POA: Diagnosis not present

## 2015-11-18 ENCOUNTER — Encounter
Admission: RE | Admit: 2015-11-18 | Discharge: 2015-11-18 | Disposition: A | Discharge status: Home / Self Care | Payer: Commercial Managed Care - HMO | Source: Ambulatory Visit | Attending: Vascular Surgery | Admitting: Vascular Surgery

## 2015-11-18 DIAGNOSIS — Z01810 Encounter for preprocedural cardiovascular examination: Secondary | ICD-10-CM | POA: Diagnosis not present

## 2015-11-18 DIAGNOSIS — Z01812 Encounter for preprocedural laboratory examination: Secondary | ICD-10-CM | POA: Insufficient documentation

## 2015-11-18 HISTORY — DX: Essential (primary) hypertension: I10

## 2015-11-18 HISTORY — DX: Pure hypercholesterolemia, unspecified: E78.00

## 2015-11-18 HISTORY — DX: Anemia, unspecified: D64.9

## 2015-11-18 LAB — CBC WITH DIFFERENTIAL/PLATELET
Basophils Absolute: 0 10*3/uL (ref 0–0.1)
Basophils Relative: 1 %
EOS ABS: 0.3 10*3/uL (ref 0–0.7)
EOS PCT: 5 %
HCT: 25.6 % — ABNORMAL LOW (ref 35.0–47.0)
Hemoglobin: 8.4 g/dL — ABNORMAL LOW (ref 12.0–16.0)
LYMPHS ABS: 1.1 10*3/uL (ref 1.0–3.6)
Lymphocytes Relative: 15 %
MCH: 28 pg (ref 26.0–34.0)
MCHC: 32.8 g/dL (ref 32.0–36.0)
MCV: 85.3 fL (ref 80.0–100.0)
MONO ABS: 0.4 10*3/uL (ref 0.2–0.9)
MONOS PCT: 6 %
Neutro Abs: 5.1 10*3/uL (ref 1.4–6.5)
Neutrophils Relative %: 73 %
PLATELETS: 148 10*3/uL — AB (ref 150–440)
RBC: 3 MIL/uL — ABNORMAL LOW (ref 3.80–5.20)
RDW: 15 % — AB (ref 11.5–14.5)
WBC: 7 10*3/uL (ref 3.6–11.0)

## 2015-11-18 LAB — TYPE AND SCREEN
ABO/RH(D): A POS
Antibody Screen: NEGATIVE

## 2015-11-18 LAB — BASIC METABOLIC PANEL
Anion gap: 13 (ref 5–15)
BUN: 71 mg/dL — AB (ref 6–20)
CHLORIDE: 109 mmol/L (ref 101–111)
CO2: 21 mmol/L — ABNORMAL LOW (ref 22–32)
CREATININE: 7.76 mg/dL — AB (ref 0.44–1.00)
Calcium: 8.6 mg/dL — ABNORMAL LOW (ref 8.9–10.3)
GFR calc Af Amer: 6 mL/min — ABNORMAL LOW (ref >60)
GFR calc non Af Amer: 5 mL/min — ABNORMAL LOW (ref >60)
GLUCOSE: 83 mg/dL (ref 65–99)
Potassium: 5 mmol/L (ref 3.5–5.1)
SODIUM: 143 mmol/L (ref 135–145)

## 2015-11-18 LAB — SURGICAL PCR SCREEN
MRSA, PCR: NEGATIVE
Staphylococcus aureus: NEGATIVE

## 2015-11-18 LAB — PROTIME-INR
INR: 0.88
PROTHROMBIN TIME: 12.2 s (ref 11.4–15.0)

## 2015-11-18 LAB — APTT: aPTT: 34 seconds (ref 24–36)

## 2015-11-18 NOTE — Patient Instructions (Addendum)
Your procedure is scheduled on: Thursday 11/25/15 Report to Day Surgery. 2ND FLOOR MEDIAL MALL ENTRANCE To find out your arrival time please call [phone] between 1PM - 3PM on Wednesday 11/24/15.  Remember: Instructions that are not followed completely may result in serious medical risk, up to and including death, or upon the discretion of your surgeon and anesthesiologist your surgery may need to be rescheduled.    __X__ 1. Do not eat food or drink liquids after midnight. No gum chewing or hard candies.     __X__ 2. No Alcohol for 24 hours before or after surgery.   ____ 3. Bring all medications with you on the day of surgery if instructed.    __X__ 4. Notify your doctor if there is any change in your medical condition     (cold, fever, infections).     Do not wear jewelry, make-up, hairpins, clips or nail polish.  Do not wear lotions, powders, or perfumes.   Do not shave 48 hours prior to surgery. Men may shave face and neck.  Do not bring valuables to the hospital.    [facility] is not responsible for any belongings or valuables.               Contacts, dentures or bridgework may not be worn into surgery.  Leave your suitcase in the car. After surgery it may be brought to your room.  For patients admitted to the hospital, discharge time is determined by your                treatment team.   Patients discharged the day of surgery will not be allowed to drive home.   Please read over the following fact sheets that you were given:   MRSA Information and Surgical Site Infection Prevention   __X__ Take these medicines the morning of surgery with A SIP OF WATER:    1. AMLODIPINE  2. GABAPENTIN  3.   4.  5.  6.  ____ Fleet Enema (as directed)   __X__ Use CHG Soap as directed  ____ Use inhalers on the day of surgery  ____ Stop metformin 2 days prior to surgery    __X__ Take 1/2 of usual insulin dose the night before surgery and none on the morning of surgery.   (12 UNITS  AT BEDTIME)  ____ Stop Coumadin/Plavix/aspirin on   ____ Stop Anti-inflammatories on    ____ Stop supplements until after surgery.  STOP FISH OIL TODAY  ____ Bring C-Pap to the hospital.     Peritoneal Dialysis Dialysis is a procedure that replaces some of the work healthy kidneys do. It is done when you lose about 85-90% of your kidney function, and sometimes earlier if your symptoms may be improved by dialysis. During dialysis, wastes, salt, and extra water are removed from the blood and the level of certain chemicals in the blood (such as potassium) is maintained. Peritoneal dialysis is a type of dialysis in which the thin lining of the abdomen (peritoneum) and a fluid called dialysate are used to do these tasks.  Before beginning peritoneal dialysis, you will have surgery to place a thin, plastic tube (catheter) in your abdomen. The catheter will be small, soft, and easy to conceal. It will be used to transfer a fluid called dialysate to and from your abdomen during each session. The surgery is usually done at least 2 weeks before you begin dialysis. HOW DOES PERITONEAL DIALYSIS WORK? At the start of a session, your  abdomen is filled with dialysate. Dialysate contains a sugar that pulls wastes, salt, and extra water from the blood. These substances pass through the peritoneum and into the dialysate over the course of several hours. At the end of the session, the dialysate is drained from the body. The abdomen is then refilled with dialysate and the procedure is repeated. The process of draining and filling the abdomen with dialysate is called an exchange. The time the dialysate is in your body between exchanges is called a dwell. The dwell depends on the number of exchanges needed, the type of dialysate used, and the characteristics of the peritoneum. It usually varies from 1.5-3 hours. Peritoneal dialysis is done during the day or at night while you are sleeping. Peritoneal dialysis that is  done during the day is called continuous ambulatory peritoneal dialysis (CAPD). In CAPD, exchanges are performed up to 5 times a day. Each exchange takes about 30-40 minutes. You may go about your day normally between exchanges. Peritoneal dialysis that is done at night while you are sleeping is called continuous cycling peritoneal dialysis (CCPD).In CCPD, a machine called a cycler performs exchanges while you are sleeping. Sometimes a combination of CAPD and CCPD is needed. RISKS AND COMPLICATIONS Generally, peritoneal dialysis is a safe procedure. However, problems can occur and include:  Infection in the peritoneum. This is the most common problem of peritoneal dialysis.  Infection around the site of catheter insertion.  Weakened abdominal muscles. This may lead to a hernia, which can in turn have problems if left untreated. PREPARING FOR AN EXCHANGE 1. Close doors and windows and turn off any fans before performing an exchange. Make sure you are in a space without drafts or air currents. Doing these things reduces your risk of infection. 2. Wear a mask to cover your nose and mouth. Do this before you wash your hands and handle equipment. Keep the mask on during each exchange. 3. Wash your hands thoroughly before each exchange. Use a gel or foam. 4. Make sure your catheter and its cap are sterile. If you are using a cycler, make sure the device that attaches the dialysate bag and tubing to the catheter (adapter) is also sterile. 5. Make sure the area of your body around the catheter is sterile. 6. Check your blood pressure if directed by your health care provider. Your blood pressure reading may help determine what type of dialysate to use. 7. Examine the dialysate: 1. Make sure the bag is the right size and contains the correct mixture. Size information is on the label.  2. Gently squeeze the bag to make sure there are no leaks.  3. Look at the color of the dialysate. The dialysate should  be clear. You should be able to see any writing on the side of the bag clearly through the solution. Do not use a cloudy solution. 4. Check the expiration date. The expiration date is on the label. If it is past the expiration date, throw the bag away. 8. Warm the dialysate using a dry heating pad. Leave the dialysate in the bag with the cover on while doing this. Do not use a microwave or hot water to warm the dialysate. 9. If you are using a cycler, make sure it is set up and programmed correctly. HOW TO PERFORM AN EXCHANGE The following steps illustrate how exchanges are commonly performed. The actual way in which an exchange should be performed varies depending on the equipment used and other factors. Always perform  exchanges exactly as your health care provider has trained you to do. Remember to always wash your hands before touching any tubing. This is extremely important in preventing infection. CAPD 1. Hang your bag of dialysate above the level of your abdomen on the IV pole. 2. Place a drain bag below your abdomen. 3. Pull out the ring of y-shaped tubing that connects both bags. 4. Uncap the tube that is connected to your catheter (transfer set) and immediately attach it to the y-shaped tubing of the dialysate and drain bags. 5. Twist open the clamp on the transfer set. This will cause the fluid in your abdomen to drain through the tube that goes to the drain bag (drain line). It usually takes about 20 minutes for all of the fluid to drain. 6. Once the fluid has finished draining, twist lock the clamp of the transfer set. Then clamp the drain line. 7. Break the seal (frangible) on the tubing that is connected to the dialysate bag (fill line) and then unclamp the drain line. Air bubbles will flow into the drain line. Make sure your transfer set is still locked so that no air gets into the abdomen. 8. Count to 5 and then clamp the drain line. 9. Twist the transfer set clamp to open it and  allow the dialysate to flow into your abdomen. This should take about 10 minutes. 10. Once the fill is complete, twist the transfer set clamp to lock it and then clamp the tubing of the fill line. 11. Detach the tubing from the catheter and immediately cap the catheter transfer set. Secure the transfer set to your abdomen wall using tape as instructed. 12. Inspect the drainage. The fluid may look like urine, but it should be clear. 13. Weigh the drain bag and record the volume drained. 14. Check your blood pressure, temperature, and pulse if it is the first exchange of the day. 15. Allow the solution to stay in the abdomen for as long as directed by your health care provider. While the solution is in your abdomen, you may go about your day. If your body cannot go all night without an exchange, a cycler may be used to perform exchanges while you sleep. The cycler is similar to the one used during CCPD but is smaller. Using a cycler allows you to sleep without having to get up and do an exchange. CCPD  When you are ready for bed, put the dialysate bags onto the cycler. Put on exactly the number of bags that your health care provider said to use.   Cyclers have a small cartridge (cassette) with tubing that attaches to each dialysate bag and the tube that goes to the patient (patient line). Depending on the number of dialysate bags you are to use, all the tubes on the cassette may need to be connected to separate dialysate bags. If you use fewer bags than there are tubes on the cassette, the extra lines will need to be clamped.  Insert the cassette with the attached tubing into the front of the cycler.  Make sure that the drain line extends to the toilet. The tip of the drain line should not touch the water in the toilet bowl.  Pull the ring on the tubing from the dialysate bag on the heating pad of the cycler and attach it to the first tube of the cassette. Then break the frangible on the tubing of  this dialysate bag.  Repeat this process with all the bags  you need to use.  Start the cycler. This will prepare (prime) the cycler and tubing by filling it with dialysate and getting rid of all the air in the tubes.  Once all lines are primed, the cycler will instruct you to connect yourself.  Remove the pull ring from the patient line with one hand.  Uncap your transfer set and attach it to the patient line.  Twist open the clamp on the transfer set.  Press "GO" on the cycler and it will begin the draining and filling process. The cycler may do 3-5 exchanges overnight, depending on what your health care provider recommended.  In the morning, record all volumes and times shown on the cycler.  Press "GO" on the cycler, which will then instruct you to close all clamps.  Close your transfer set twist clamp and then clamp all the tubes that go to the cycler.  When indicated by the cycler, disconnect the patient line from the transfer set and immediately re-cap your transfer set.  Secure the transfer set to your abdomen wall using tape as instructed.  Check your blood pressure, temperature, and pulse. The solution that is in your abdomen in the morning will stay there during the day. If the body cannot go all day without an exchange, you may need to perform an exchange during the day. Follow the steps under CAPD if an extra exchange is needed. HOME CARE INSTRUCTIONS  Follow diet instructions as directed by your health care provider.  Avoid becoming constipated. Constipation prevents dialysate from draining effectively after a dwell. To prevent constipation, make sure you are eating fiber-rich foods and avoiding foods that cause constipation. Increasing your physical activity and going to the restroom when you feel you need to instead of holding it in can also prevent constipation. If your health care provider recommends drugs to prevent constipation such as laxatives, use them only as  directed.  Always keep the dialysate bags and other supplies in a cool, clean, and dry place.  Keep a strict schedule. Dialysis must be done every day. Do not skip a day or an exchange. Make sure to make time for each exchange.  Weigh yourself every day. Sudden weight gain may be a sign of a problem.  Take medicines only as directed by your health care provider. SEEK MEDICAL CARE IF:   You have a fever or chills.  You feel sick to your stomach (nauseous) or throw up (vomit).  You have diarrhea.  You have any problems with an exchange.   Your blood pressure increases.  You suddenly gain weight or feel short of breath.  The catheter seems loose or feels like it is coming out.   The fluid that has drained from your abdomen is pinkish or reddish. However, women having their menstrual period do not need to seek medical care if the fluid is only a little pink or red.  There are white strands in the dialysate that are large enough to get stuck in your tubing or catheter. SEEK IMMEDIATE MEDICAL CARE IF:   The area around the catheter swells or becomes red, tender, or painful.  There is pus coming from the catheter site.  The fluid that has drained from your abdomen is cloudy.  You feel abdominal pain or discomfort.   This information is not intended to replace advice given to you by your health care provider. Make sure you discuss any questions you have with your health care provider.   Document Released: 05/07/2009  Document Revised: 07/31/2014 Document Reviewed: 12/05/2012 Elsevier Interactive Patient Education [employer].

## 2015-11-18 NOTE — Pre-Procedure Instructions (Addendum)
NOTIFIED DR Amie Critchley OF PATIENT HEMOGLOBIN 8.4. NO NEW ORDERS. FAXED LAB RESULTS TO [facility].

## 2015-11-25 ENCOUNTER — Encounter
Admission: RE | Disposition: A | Discharge status: Home / Self Care | Payer: Self-pay | Source: Ambulatory Visit | Attending: Vascular Surgery

## 2015-11-25 ENCOUNTER — Ambulatory Visit: Payer: Commercial Managed Care - HMO | Admitting: Anesthesiology

## 2015-11-25 ENCOUNTER — Encounter: Payer: Self-pay | Admitting: *Deleted

## 2015-11-25 ENCOUNTER — Ambulatory Visit
Admission: RE | Admit: 2015-11-25 | Discharge: 2015-11-25 | Disposition: A | Discharge status: Home / Self Care | Payer: Commercial Managed Care - HMO | Source: Ambulatory Visit | Attending: Vascular Surgery | Admitting: Vascular Surgery

## 2015-11-25 DIAGNOSIS — D638 Anemia in other chronic diseases classified elsewhere: Secondary | ICD-10-CM | POA: Insufficient documentation

## 2015-11-25 DIAGNOSIS — D649 Anemia, unspecified: Secondary | ICD-10-CM | POA: Diagnosis not present

## 2015-11-25 DIAGNOSIS — Z4902 Encounter for fitting and adjustment of peritoneal dialysis catheter: Secondary | ICD-10-CM | POA: Insufficient documentation

## 2015-11-25 DIAGNOSIS — N186 End stage renal disease: Secondary | ICD-10-CM | POA: Diagnosis not present

## 2015-11-25 DIAGNOSIS — I10 Essential (primary) hypertension: Secondary | ICD-10-CM | POA: Diagnosis not present

## 2015-11-25 DIAGNOSIS — E1122 Type 2 diabetes mellitus with diabetic chronic kidney disease: Secondary | ICD-10-CM | POA: Diagnosis not present

## 2015-11-25 DIAGNOSIS — E785 Hyperlipidemia, unspecified: Secondary | ICD-10-CM | POA: Diagnosis not present

## 2015-11-25 DIAGNOSIS — Z90710 Acquired absence of both cervix and uterus: Secondary | ICD-10-CM | POA: Diagnosis not present

## 2015-11-25 DIAGNOSIS — Z79899 Other long term (current) drug therapy: Secondary | ICD-10-CM | POA: Insufficient documentation

## 2015-11-25 DIAGNOSIS — I120 Hypertensive chronic kidney disease with stage 5 chronic kidney disease or end stage renal disease: Secondary | ICD-10-CM | POA: Insufficient documentation

## 2015-11-25 DIAGNOSIS — N184 Chronic kidney disease, stage 4 (severe): Secondary | ICD-10-CM

## 2015-11-25 DIAGNOSIS — N185 Chronic kidney disease, stage 5: Secondary | ICD-10-CM | POA: Insufficient documentation

## 2015-11-25 DIAGNOSIS — Z794 Long term (current) use of insulin: Secondary | ICD-10-CM | POA: Insufficient documentation

## 2015-11-25 DIAGNOSIS — Z7982 Long term (current) use of aspirin: Secondary | ICD-10-CM | POA: Diagnosis not present

## 2015-11-25 DIAGNOSIS — E7800 Pure hypercholesterolemia, unspecified: Secondary | ICD-10-CM | POA: Insufficient documentation

## 2015-11-25 DIAGNOSIS — Z833 Family history of diabetes mellitus: Secondary | ICD-10-CM | POA: Insufficient documentation

## 2015-11-25 DIAGNOSIS — E119 Type 2 diabetes mellitus without complications: Secondary | ICD-10-CM | POA: Diagnosis not present

## 2015-11-25 HISTORY — PX: CAPD INSERTION: SHX5233

## 2015-11-25 LAB — POCT I-STAT 4, (NA,K, GLUC, HGB,HCT)
GLUCOSE: 70 mg/dL (ref 65–99)
Glucose, Bld: 69 mg/dL (ref 65–99)
HEMATOCRIT: 24 % — AB (ref 36.0–46.0)
HEMATOCRIT: 24 % — AB (ref 36.0–46.0)
HEMOGLOBIN: 8.2 g/dL — AB (ref 12.0–15.0)
HEMOGLOBIN: 8.2 g/dL — AB (ref 12.0–15.0)
POTASSIUM: 5 mmol/L (ref 3.5–5.1)
Potassium: 5 mmol/L (ref 3.5–5.1)
SODIUM: 141 mmol/L (ref 135–145)
Sodium: 142 mmol/L (ref 135–145)

## 2015-11-25 LAB — GLUCOSE, CAPILLARY
GLUCOSE-CAPILLARY: 101 mg/dL — AB (ref 65–99)
Glucose-Capillary: 73 mg/dL (ref 65–99)

## 2015-11-25 SURGERY — LAPAROSCOPIC INSERTION CONTINUOUS AMBULATORY PERITONEAL DIALYSIS  (CAPD) CATHETER
Anesthesia: General

## 2015-11-25 MED ORDER — LIDOCAINE HCL (CARDIAC) 20 MG/ML IV SOLN
INTRAVENOUS | Status: DC | PRN
  Administered 2015-11-25: 100 mg via INTRAVENOUS

## 2015-11-25 MED ORDER — PROPOFOL 10 MG/ML IV BOLUS
INTRAVENOUS | Status: DC | PRN
  Administered 2015-11-25: 150 mg via INTRAVENOUS

## 2015-11-25 MED ORDER — FAMOTIDINE 20 MG PO TABS
20.0000 mg | ORAL_TABLET | Freq: Once | ORAL | Status: AC
  Administered 2015-11-25: 20 mg via ORAL

## 2015-11-25 MED ORDER — HYDROCODONE-ACETAMINOPHEN 5-325 MG PO TABS: 1.0000 | ORAL_TABLET | Freq: Four times a day (QID) | ORAL | Status: DC | PRN

## 2015-11-25 MED ORDER — ONDANSETRON HCL 4 MG/2ML IJ SOLN: 4.0000 mg | Freq: Once | INTRAMUSCULAR | Status: DC | PRN

## 2015-11-25 MED ORDER — CLINDAMYCIN PHOSPHATE 300 MG/50ML IV SOLN: 300.0000 mg | Freq: Once | INTRAVENOUS | Status: DC

## 2015-11-25 MED ORDER — SUGAMMADEX SODIUM 200 MG/2ML IV SOLN
INTRAVENOUS | Status: DC | PRN
  Administered 2015-11-25: 140 mg via INTRAVENOUS

## 2015-11-25 MED ORDER — MIDAZOLAM HCL 2 MG/2ML IJ SOLN
INTRAMUSCULAR | Status: DC | PRN
Start: 2015-11-25 — End: 2015-11-25
  Administered 2015-11-25: 1 mg via INTRAVENOUS

## 2015-11-25 MED ORDER — FENTANYL CITRATE (PF) 100 MCG/2ML IJ SOLN: 25.0000 ug | INTRAMUSCULAR | Status: DC | PRN

## 2015-11-25 MED ORDER — SODIUM CHLORIDE 0.9 % IV SOLN
INTRAVENOUS | Status: DC
  Administered 2015-11-25: 14:00:00 via INTRAVENOUS

## 2015-11-25 MED ORDER — DEXTROSE-NACL 5-0.45 % IV SOLN
INTRAVENOUS | Status: DC
  Administered 2015-11-25: 15:00:00 via INTRAVENOUS

## 2015-11-25 MED ORDER — FENTANYL CITRATE (PF) 100 MCG/2ML IJ SOLN
INTRAMUSCULAR | Status: DC | PRN
  Administered 2015-11-25: 150 ug via INTRAVENOUS

## 2015-11-25 MED ORDER — ROCURONIUM BROMIDE 100 MG/10ML IV SOLN
INTRAVENOUS | Status: DC | PRN
Start: 2015-11-25 — End: 2015-11-25
  Administered 2015-11-25: 20 mg via INTRAVENOUS

## 2015-11-25 MED ORDER — FAMOTIDINE 20 MG PO TABS
ORAL_TABLET | ORAL | Status: AC
  Administered 2015-11-25: 20 mg via ORAL
  Filled 2015-11-25: qty 1

## 2015-11-25 MED ORDER — CLINDAMYCIN PHOSPHATE 300 MG/50ML IV SOLN
INTRAVENOUS | Status: AC
  Administered 2015-11-25: 300 mg via INTRAVENOUS
  Filled 2015-11-25: qty 50

## 2015-11-25 MED ORDER — ONDANSETRON HCL 4 MG/2ML IJ SOLN
INTRAMUSCULAR | Status: DC | PRN
  Administered 2015-11-25: 4 mg via INTRAVENOUS

## 2015-11-25 MED ORDER — EPHEDRINE SULFATE 50 MG/ML IJ SOLN
INTRAMUSCULAR | Status: DC | PRN
  Administered 2015-11-25: 10 mg via INTRAVENOUS

## 2015-11-25 SURGICAL SUPPLY — 30 items
ADAPTER BETA CAP QUINTON DIALY (ADAPTER) ×3 IMPLANT
ADAPTER CATH DIALYSIS 18.75 (CATHETERS) ×2 IMPLANT
ADAPTER CATH DIALYSIS 18.75CM (CATHETERS) ×1
CANISTER SUCT 1200ML W/VALVE (MISCELLANEOUS) ×3 IMPLANT
CATH DLYS SWAN NECK 62.5CM (CATHETERS) ×3 IMPLANT
CHLORAPREP W/TINT 26ML (MISCELLANEOUS) ×3 IMPLANT
ELECT CAUTERY BLADE 6.4 (BLADE) ×3 IMPLANT
ELECT REM PT RETURN 9FT ADLT (ELECTROSURGICAL) ×3
ELECTRODE REM PT RTRN 9FT ADLT (ELECTROSURGICAL) ×1 IMPLANT
GLOVE BIO SURGEON STRL SZ7 (GLOVE) ×6 IMPLANT
GLOVE INDICATOR 7.5 STRL GRN (GLOVE) ×3 IMPLANT
GOWN STRL REUS W/ TWL LRG LVL3 (GOWN DISPOSABLE) ×2 IMPLANT
GOWN STRL REUS W/ TWL XL LVL3 (GOWN DISPOSABLE) ×1 IMPLANT
GOWN STRL REUS W/TWL LRG LVL3 (GOWN DISPOSABLE) ×4
GOWN STRL REUS W/TWL XL LVL3 (GOWN DISPOSABLE) ×2
IV NS 500ML (IV SOLUTION) ×2
IV NS 500ML BAXH (IV SOLUTION) ×1 IMPLANT
KIT RM TURNOVER STRD PROC AR (KITS) ×3 IMPLANT
LABEL OR SOLS (LABEL) ×3 IMPLANT
LIQUID BAND (GAUZE/BANDAGES/DRESSINGS) ×3 IMPLANT
MINICAP W/POVIDONE IODINE SOL (MISCELLANEOUS) ×3 IMPLANT
PACK LAP CHOLECYSTECTOMY (MISCELLANEOUS) ×3 IMPLANT
PENCIL ELECTRO HAND CTR (MISCELLANEOUS) ×3 IMPLANT
SET CYSTO W/LG BORE CLAMP LF (SET/KITS/TRAYS/PACK) ×3 IMPLANT
SET TRANSFER 6 W/TWIST CLAMP 5 (SET/KITS/TRAYS/PACK) ×3 IMPLANT
SPONGE EXCIL AMD DRAIN 4X4 6P (MISCELLANEOUS) ×3 IMPLANT
SUT MNCRL AB 4-0 PS2 18 (SUTURE) ×3 IMPLANT
SUT VICRYL+ 3-0 36IN CT-1 (SUTURE) ×3 IMPLANT
TROCAR XCEL NON-BLD 11X100MML (ENDOMECHANICALS) ×3 IMPLANT
TUBING INSUFFLATOR HI FLOW (MISCELLANEOUS) ×3 IMPLANT

## 2015-11-25 NOTE — Discharge Instructions (Signed)
AMBULATORY SURGERY  DISCHARGE INSTRUCTIONS   1) The drugs that you were given will stay in your system until tomorrow so for the next 24 hours you should not:  A) Drive an automobile B) Make any legal decisions C) Drink any alcoholic beverage   2) You may resume regular meals tomorrow.  Today it is better to start with liquids and gradually work up to solid foods.  You may eat anything you prefer, but it is better to start with liquids, then soup and crackers, and gradually work up to solid foods.   3) Please notify your doctor immediately if you have any unusual bleeding, trouble breathing, redness and pain at the surgery site, drainage, fever, or pain not relieved by medication.    4) Additional Instructions: You may shower in 48 hours, no lifting for two weeks, report any drainage or bleeding at incisions.  Dialysis nurse will do first dressing change.        Please contact your physician with any problems or Same Day Surgery at [phone], Monday through Friday 6 am to 4 pm, or [place] at [facility] number at [phone].

## 2015-11-25 NOTE — Anesthesia Procedure Notes (Signed)
Procedure Name: Intubation Date/Time: 11/25/2015 4:26 PM Performed by: Rosaria Ferries, Tomy Khim Pre-anesthesia Checklist: Patient identified Patient Re-evaluated:Patient Re-evaluated prior to inductionOxygen Delivery Method: Circle system utilized Preoxygenation: Pre-oxygenation with 100% oxygen Intubation Type: IV induction Laryngoscope Size: Mac and 3 Grade View: Grade I Tube type: Oral Number of attempts: 1 Placement Confirmation: breath sounds checked- equal and bilateral,  ETT inserted through vocal cords under direct vision and positive ETCO2 Secured at: 21 cm Tube secured with: Tape Dental Injury: Teeth and Oropharynx as per pre-operative assessment

## 2015-11-25 NOTE — Transfer of Care (Signed)
Immediate Anesthesia Transfer of Care Note  Patient: Bethany Lee  Procedure(s) Performed: Procedure(s): LAPAROSCOPIC INSERTION CONTINUOUS AMBULATORY PERITONEAL DIALYSIS  (CAPD) CATHETER (N/A)  Patient Location: PACU  Anesthesia Type:General  Level of Consciousness: sedated  Airway & Oxygen Therapy: Patient Spontanous Breathing and Patient connected to face mask oxygen  Post-op Assessment: Report given to RN and Post -op Vital signs reviewed and stable  Post vital signs: Reviewed and stable  Last Vitals:  Filed Vitals:   11/25/15 1317 11/25/15 1718  BP: 171/63 127/86  Pulse: 89 98  Temp: 36.2 C 36.2 C  Resp: 16 12    Last Pain:  Filed Vitals:   11/25/15 1719  PainSc: 2          Complications: No apparent anesthesia complications

## 2015-11-25 NOTE — Anesthesia Preprocedure Evaluation (Addendum)
Anesthesia Evaluation  Patient identified by MRN, date of birth, ID band Patient awake    Reviewed: Allergy & Precautions, NPO status , Patient's Chart, lab work & pertinent test results, reviewed documented beta blocker date and time   Airway Mallampati: II  TM Distance: >3 FB     Dental  (+) Chipped   Pulmonary former smoker,           Cardiovascular hypertension, Pt. on medications      Neuro/Psych    GI/Hepatic   Endo/Other  diabetes  Renal/GU Renal disease     Musculoskeletal   Abdominal   Peds  Hematology  (+) anemia ,   Anesthesia Other Findings Almost ESRD. Anemic hb 8.4. Platelets 148.  Reproductive/Obstetrics                            Anesthesia Physical Anesthesia Plan  ASA: III  Anesthesia Plan: General   Post-op Pain Management:    Induction: Intravenous  Airway Management Planned: Oral ETT  Additional Equipment:   Intra-op Plan:   Post-operative Plan:   Informed Consent: I have reviewed the patients History and Physical, chart, labs and discussed the procedure including the risks, benefits and alternatives for the proposed anesthesia with the patient or authorized representative who has indicated his/her understanding and acceptance.     Plan Discussed with: CRNA  Anesthesia Plan Comments:         Anesthesia Quick Evaluation

## 2015-11-25 NOTE — Anesthesia Postprocedure Evaluation (Signed)
Anesthesia Post Note  Patient: Bethany Lee  Procedure(Lee) Performed: Procedure(Lee) (LRB): LAPAROSCOPIC INSERTION CONTINUOUS AMBULATORY PERITONEAL DIALYSIS  (CAPD) CATHETER (N/A)  Patient location during evaluation: PACU Anesthesia Type: General Level of consciousness: awake and alert Pain management: pain level controlled Vital Signs Assessment: post-procedure vital signs reviewed and stable Respiratory status: spontaneous breathing, nonlabored ventilation, respiratory function stable and patient connected to nasal cannula oxygen Cardiovascular status: blood pressure returned to baseline and stable Postop Assessment: no signs of nausea or vomiting Anesthetic complications: no    Last Vitals:  Filed Vitals:   11/25/15 1759 11/25/15 1836  BP: 134/67 122/67  Pulse: 93 90  Temp: 36.8 C 36.9 C  Resp: 14 16    Last Pain:  Filed Vitals:   11/25/15 1837  PainSc: 0-No pain                 Bethany Lee

## 2015-11-25 NOTE — H&P (Signed)
  [facility] History & Physical Update  The patient was interviewed and re-examined.  The patient's previous History and Physical has been reviewed and is unchanged.  There is no change in the plan of care. We plan to proceed with the scheduled procedure.  DEW,JASON, MD  11/25/2015, 3:19 PM

## 2015-11-25 NOTE — OR Nursing (Signed)
Called Dr Lucky Cowboy and clarified discharge instructions.

## 2015-11-25 NOTE — Op Note (Signed)
  OPERATIVE NOTE   PROCEDURE: 1. Laparoscopic peritoneal dialysis catheter placement.  PRE-OPERATIVE DIAGNOSIS: 1. 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 Anemia of chronic disease  POST-OPERATIVE DIAGNOSIS: Same  SURGEON: Leotis Pain, MD  ASSISTANT(S): None  ANESTHESIA: general  ESTIMATED BLOOD LOSS: Minimal   FINDING(S): 1. None  SPECIMEN(S): None  INDICATIONS:  Patient presents with severe CKD nearing dialysis dependence. The patient has decided to do peritoneal dialysis for his long-term dialysis. Risks and benefits of placement were discussed and he is agreeable to proceed.  Differences between peritoneal dialysis and hemodialysis were discussed.    DESCRIPTION: After obtaining full informed written consent, the patient was brought back to the operating room and placed supine upon the operating table. The patient received IV antibiotics prior to induction. After obtaining adequate anesthesia, the abdomen was prepped and draped in the standard fashion. A small transverse incision was created just to the left of the umbilicus and we dissected down to the fascia and placed a pursestring Vicryl suture. I then entered the peritoneum with an 3mm Optiview trocar placed in the right upper quadrant and insufflated the abdomen with carbon dioxide. I then entered the peritoneum just beside the umbilicus with a trocar and the peritoneal dialysis catheter under direct visualization. The coiled portion of the catheter was parked into the pelvis under direct laparoscopic guidance. The deep cuff was secured to the fascial pursestring suture. A small counterincision was made in the left abdomen and the catheter was brought out this site. The appropriate distal connectors were placed, and I then placed 500 cc of saline through the catheter into the pelvis. The abdomen was desufflated. Immediately, 400 cc of effluent returned through the catheter when the bag was placed to gravity. I took one more look with the camera  to ensure that the catheter was in the pelvis and it was. The 69mm trocar was then removed. I then closed the incisions with 3-0 Vicryl and 4-0 Monocryl and placed Dermabond as dressing. Dry dressing was placed around the catheter exit site. The patient was then awakened from anesthesia and taken to the recovery room in stable condition having tolerated the procedure well.  COMPLICATIONS: None  CONDITION: None  Bethany Lee  12/02/2014, 8:29 AM

## 2015-11-26 ENCOUNTER — Encounter: Payer: Self-pay | Admitting: Vascular Surgery

## 2015-11-26 DIAGNOSIS — E1121 Type 2 diabetes mellitus with diabetic nephropathy: Secondary | ICD-10-CM | POA: Diagnosis not present

## 2015-11-26 DIAGNOSIS — M7989 Other specified soft tissue disorders: Secondary | ICD-10-CM | POA: Diagnosis not present

## 2015-11-26 DIAGNOSIS — E1122 Type 2 diabetes mellitus with diabetic chronic kidney disease: Secondary | ICD-10-CM | POA: Diagnosis not present

## 2015-11-26 DIAGNOSIS — Z794 Long term (current) use of insulin: Secondary | ICD-10-CM | POA: Diagnosis not present

## 2015-11-26 DIAGNOSIS — E113412 Type 2 diabetes mellitus with severe nonproliferative diabetic retinopathy with macular edema, left eye: Secondary | ICD-10-CM | POA: Diagnosis not present

## 2015-11-26 DIAGNOSIS — N184 Chronic kidney disease, stage 4 (severe): Secondary | ICD-10-CM | POA: Diagnosis not present

## 2015-11-26 DIAGNOSIS — M79672 Pain in left foot: Secondary | ICD-10-CM | POA: Diagnosis not present

## 2015-11-29 ENCOUNTER — Inpatient Hospital Stay: Payer: Commercial Managed Care - HMO

## 2015-12-01 DIAGNOSIS — E113511 Type 2 diabetes mellitus with proliferative diabetic retinopathy with macular edema, right eye: Secondary | ICD-10-CM | POA: Diagnosis not present

## 2015-12-02 ENCOUNTER — Inpatient Hospital Stay: Payer: Commercial Managed Care - HMO | Attending: Oncology

## 2015-12-02 ENCOUNTER — Inpatient Hospital Stay: Payer: Commercial Managed Care - HMO

## 2015-12-02 VITALS — BP 136/63 | HR 94 | Resp 16

## 2015-12-02 DIAGNOSIS — N189 Chronic kidney disease, unspecified: Principal | ICD-10-CM

## 2015-12-02 DIAGNOSIS — Z794 Long term (current) use of insulin: Secondary | ICD-10-CM | POA: Insufficient documentation

## 2015-12-02 DIAGNOSIS — D631 Anemia in chronic kidney disease: Secondary | ICD-10-CM

## 2015-12-02 DIAGNOSIS — D649 Anemia, unspecified: Secondary | ICD-10-CM

## 2015-12-02 DIAGNOSIS — I129 Hypertensive chronic kidney disease with stage 1 through stage 4 chronic kidney disease, or unspecified chronic kidney disease: Secondary | ICD-10-CM | POA: Insufficient documentation

## 2015-12-02 LAB — CBC
HEMATOCRIT: 21.8 % — AB (ref 35.0–47.0)
HEMOGLOBIN: 7.8 g/dL — AB (ref 12.0–16.0)
MCH: 30.1 pg (ref 26.0–34.0)
MCHC: 35.6 g/dL (ref 32.0–36.0)
MCV: 84.5 fL (ref 80.0–100.0)
Platelets: 198 10*3/uL (ref 150–440)
RBC: 2.58 MIL/uL — AB (ref 3.80–5.20)
RDW: 15.2 % — ABNORMAL HIGH (ref 11.5–14.5)
WBC: 7.1 10*3/uL (ref 3.6–11.0)

## 2015-12-02 LAB — IRON AND TIBC
Iron: 31 ug/dL (ref 28–170)
SATURATION RATIOS: 12 % (ref 10.4–31.8)
TIBC: 254 ug/dL (ref 250–450)
UIBC: 223 ug/dL

## 2015-12-02 LAB — FERRITIN: FERRITIN: 78 ng/mL (ref 11–307)

## 2015-12-02 LAB — VITAMIN B12: Vitamin B-12: 1369 pg/mL — ABNORMAL HIGH (ref 180–914)

## 2015-12-02 MED ORDER — EPOETIN ALFA 40000 UNIT/ML IJ SOLN
40000.0000 [IU] | Freq: Once | INTRAMUSCULAR | Status: AC
  Administered 2015-12-02: 40000 [IU] via SUBCUTANEOUS
  Filled 2015-12-02: qty 1

## 2015-12-03 DIAGNOSIS — E113511 Type 2 diabetes mellitus with proliferative diabetic retinopathy with macular edema, right eye: Secondary | ICD-10-CM | POA: Diagnosis not present

## 2015-12-03 DIAGNOSIS — E113412 Type 2 diabetes mellitus with severe nonproliferative diabetic retinopathy with macular edema, left eye: Secondary | ICD-10-CM | POA: Diagnosis not present

## 2015-12-08 ENCOUNTER — Ambulatory Visit
Admission: RE | Admit: 2015-12-08 | Discharge: 2015-12-08 | Disposition: A | Discharge status: Home / Self Care | Payer: Commercial Managed Care - HMO | Source: Ambulatory Visit | Attending: Nephrology | Admitting: Nephrology

## 2015-12-08 ENCOUNTER — Other Ambulatory Visit: Payer: Self-pay | Admitting: Nephrology

## 2015-12-08 DIAGNOSIS — D631 Anemia in chronic kidney disease: Secondary | ICD-10-CM | POA: Diagnosis not present

## 2015-12-08 DIAGNOSIS — I129 Hypertensive chronic kidney disease with stage 1 through stage 4 chronic kidney disease, or unspecified chronic kidney disease: Secondary | ICD-10-CM | POA: Diagnosis not present

## 2015-12-08 DIAGNOSIS — Z111 Encounter for screening for respiratory tuberculosis: Secondary | ICD-10-CM

## 2015-12-08 DIAGNOSIS — I120 Hypertensive chronic kidney disease with stage 5 chronic kidney disease or end stage renal disease: Secondary | ICD-10-CM | POA: Diagnosis not present

## 2015-12-08 DIAGNOSIS — J069 Acute upper respiratory infection, unspecified: Secondary | ICD-10-CM | POA: Diagnosis not present

## 2015-12-08 DIAGNOSIS — N2581 Secondary hyperparathyroidism of renal origin: Secondary | ICD-10-CM | POA: Diagnosis not present

## 2015-12-08 DIAGNOSIS — N185 Chronic kidney disease, stage 5: Secondary | ICD-10-CM | POA: Diagnosis not present

## 2015-12-19 ENCOUNTER — Inpatient Hospital Stay
Admission: EM | Admit: 2015-12-19 | Discharge: 2015-12-21 | DRG: 091 | Disposition: A | Discharge status: Home / Self Care | Payer: Commercial Managed Care - HMO | Attending: Internal Medicine | Admitting: Internal Medicine

## 2015-12-19 ENCOUNTER — Emergency Department: Payer: Commercial Managed Care - HMO

## 2015-12-19 ENCOUNTER — Encounter: Payer: Self-pay | Admitting: Emergency Medicine

## 2015-12-19 DIAGNOSIS — R4701 Aphasia: Secondary | ICD-10-CM | POA: Diagnosis not present

## 2015-12-19 DIAGNOSIS — E1122 Type 2 diabetes mellitus with diabetic chronic kidney disease: Secondary | ICD-10-CM | POA: Diagnosis not present

## 2015-12-19 DIAGNOSIS — N186 End stage renal disease: Secondary | ICD-10-CM | POA: Diagnosis not present

## 2015-12-19 DIAGNOSIS — E1140 Type 2 diabetes mellitus with diabetic neuropathy, unspecified: Secondary | ICD-10-CM | POA: Diagnosis present

## 2015-12-19 DIAGNOSIS — E875 Hyperkalemia: Secondary | ICD-10-CM | POA: Diagnosis not present

## 2015-12-19 DIAGNOSIS — I120 Hypertensive chronic kidney disease with stage 5 chronic kidney disease or end stage renal disease: Secondary | ICD-10-CM | POA: Diagnosis not present

## 2015-12-19 DIAGNOSIS — N269 Renal sclerosis, unspecified: Secondary | ICD-10-CM | POA: Diagnosis present

## 2015-12-19 DIAGNOSIS — G253 Myoclonus: Principal | ICD-10-CM | POA: Diagnosis present

## 2015-12-19 DIAGNOSIS — N2581 Secondary hyperparathyroidism of renal origin: Secondary | ICD-10-CM | POA: Diagnosis present

## 2015-12-19 DIAGNOSIS — T426X5A Adverse effect of other antiepileptic and sedative-hypnotic drugs, initial encounter: Secondary | ICD-10-CM | POA: Diagnosis present

## 2015-12-19 DIAGNOSIS — I10 Essential (primary) hypertension: Secondary | ICD-10-CM | POA: Diagnosis not present

## 2015-12-19 DIAGNOSIS — E7800 Pure hypercholesterolemia, unspecified: Secondary | ICD-10-CM | POA: Diagnosis not present

## 2015-12-19 DIAGNOSIS — E785 Hyperlipidemia, unspecified: Secondary | ICD-10-CM | POA: Diagnosis present

## 2015-12-19 DIAGNOSIS — Z880 Allergy status to penicillin: Secondary | ICD-10-CM

## 2015-12-19 DIAGNOSIS — Z7982 Long term (current) use of aspirin: Secondary | ICD-10-CM | POA: Diagnosis not present

## 2015-12-19 DIAGNOSIS — Z794 Long term (current) use of insulin: Secondary | ICD-10-CM

## 2015-12-19 DIAGNOSIS — Z992 Dependence on renal dialysis: Secondary | ICD-10-CM | POA: Diagnosis not present

## 2015-12-19 DIAGNOSIS — Z79899 Other long term (current) drug therapy: Secondary | ICD-10-CM | POA: Diagnosis not present

## 2015-12-19 DIAGNOSIS — M7989 Other specified soft tissue disorders: Secondary | ICD-10-CM | POA: Diagnosis not present

## 2015-12-19 DIAGNOSIS — E876 Hypokalemia: Secondary | ICD-10-CM | POA: Diagnosis present

## 2015-12-19 DIAGNOSIS — R4182 Altered mental status, unspecified: Secondary | ICD-10-CM

## 2015-12-19 DIAGNOSIS — E119 Type 2 diabetes mellitus without complications: Secondary | ICD-10-CM

## 2015-12-19 DIAGNOSIS — Z87891 Personal history of nicotine dependence: Secondary | ICD-10-CM

## 2015-12-19 DIAGNOSIS — D638 Anemia in other chronic diseases classified elsewhere: Secondary | ICD-10-CM

## 2015-12-19 DIAGNOSIS — D631 Anemia in chronic kidney disease: Secondary | ICD-10-CM | POA: Diagnosis present

## 2015-12-19 LAB — COMPREHENSIVE METABOLIC PANEL
ALBUMIN: 3.2 g/dL — AB (ref 3.5–5.0)
ALK PHOS: 61 U/L (ref 38–126)
ALT: 13 U/L — AB (ref 14–54)
AST: 15 U/L (ref 15–41)
Anion gap: 12 (ref 5–15)
BILIRUBIN TOTAL: 0.5 mg/dL (ref 0.3–1.2)
BUN: 94 mg/dL — AB (ref 6–20)
CALCIUM: 7.6 mg/dL — AB (ref 8.9–10.3)
CO2: 20 mmol/L — ABNORMAL LOW (ref 22–32)
CREATININE: 8.66 mg/dL — AB (ref 0.44–1.00)
Chloride: 106 mmol/L (ref 101–111)
GFR calc Af Amer: 5 mL/min — ABNORMAL LOW (ref >60)
GFR calc non Af Amer: 4 mL/min — ABNORMAL LOW (ref >60)
GLUCOSE: 123 mg/dL — AB (ref 65–99)
Potassium: 6.1 mmol/L — ABNORMAL HIGH (ref 3.5–5.1)
SODIUM: 138 mmol/L (ref 135–145)
TOTAL PROTEIN: 6.5 g/dL (ref 6.5–8.1)

## 2015-12-19 LAB — DIFFERENTIAL
Basophils Absolute: 0.1 10*3/uL (ref 0–0.1)
Basophils Relative: 1 %
Eosinophils Absolute: 0.4 10*3/uL (ref 0–0.7)
Eosinophils Relative: 5 %
LYMPHS ABS: 1 10*3/uL (ref 1.0–3.6)
Monocytes Absolute: 0.4 10*3/uL (ref 0.2–0.9)
Monocytes Relative: 5 %
NEUTROS ABS: 6.2 10*3/uL (ref 1.4–6.5)

## 2015-12-19 LAB — PROTIME-INR
INR: 0.96
PROTHROMBIN TIME: 13 s (ref 11.4–15.0)

## 2015-12-19 LAB — URINALYSIS COMPLETE WITH MICROSCOPIC (ARMC ONLY)
BACTERIA UA: NONE SEEN
Bilirubin Urine: NEGATIVE
GLUCOSE, UA: 150 mg/dL — AB
Hgb urine dipstick: NEGATIVE
Ketones, ur: NEGATIVE mg/dL
Leukocytes, UA: NEGATIVE
Nitrite: NEGATIVE
Protein, ur: >500 mg/dL — AB
SPECIFIC GRAVITY, URINE: 1.009 (ref 1.005–1.030)
pH: 8 (ref 5.0–8.0)

## 2015-12-19 LAB — GLUCOSE, CAPILLARY
GLUCOSE-CAPILLARY: 125 mg/dL — AB (ref 65–99)
Glucose-Capillary: 171 mg/dL — ABNORMAL HIGH (ref 65–99)
Glucose-Capillary: 31 mg/dL — CL (ref 65–99)
Glucose-Capillary: 110 mg/dL — ABNORMAL HIGH (ref 65–99)
Glucose-Capillary: 148 mg/dL — ABNORMAL HIGH (ref 65–99)

## 2015-12-19 LAB — CBC
HCT: 26.1 % — ABNORMAL LOW (ref 35.0–47.0)
HEMOGLOBIN: 8.6 g/dL — AB (ref 12.0–16.0)
MCH: 28.1 pg (ref 26.0–34.0)
MCHC: 32.9 g/dL (ref 32.0–36.0)
MCV: 85.4 fL (ref 80.0–100.0)
PLATELETS: 210 10*3/uL (ref 150–440)
RBC: 3.06 MIL/uL — AB (ref 3.80–5.20)
RDW: 15.5 % — ABNORMAL HIGH (ref 11.5–14.5)
WBC: 8.1 10*3/uL (ref 3.6–11.0)

## 2015-12-19 LAB — BRAIN NATRIURETIC PEPTIDE: B Natriuretic Peptide: 376 pg/mL — ABNORMAL HIGH (ref 0.0–100.0)

## 2015-12-19 LAB — TROPONIN I: Troponin I: 0.03 ng/mL (ref <0.031)

## 2015-12-19 LAB — APTT: aPTT: 34 seconds (ref 24–36)

## 2015-12-19 MED ORDER — ASPIRIN EC 81 MG PO TBEC
81.0000 mg | DELAYED_RELEASE_TABLET | Freq: Every day | ORAL | Status: DC
  Administered 2015-12-19 – 2015-12-21 (×3): 81 mg via ORAL
  Filled 2015-12-19 (×3): qty 1

## 2015-12-19 MED ORDER — EPOETIN ALFA 20000 UNIT/ML IJ SOLN
20000.0000 [IU] | INTRAMUSCULAR | Status: DC
  Administered 2015-12-19: 20000 [IU] via SUBCUTANEOUS
  Filled 2015-12-19 (×2): qty 1

## 2015-12-19 MED ORDER — OMEGA-3-ACID ETHYL ESTERS 1 G PO CAPS
1200.0000 mg | ORAL_CAPSULE | Freq: Two times a day (BID) | ORAL | Status: DC
  Administered 2015-12-19 – 2015-12-21 (×5): 1000 mg via ORAL
  Filled 2015-12-19 (×5): qty 1

## 2015-12-19 MED ORDER — INSULIN ASPART 100 UNIT/ML ~~LOC~~ SOLN
0.0000 [IU] | Freq: Three times a day (TID) | SUBCUTANEOUS | Status: DC
  Administered 2015-12-20 – 2015-12-21 (×2): 2 [IU] via SUBCUTANEOUS
  Filled 2015-12-19 (×2): qty 2

## 2015-12-19 MED ORDER — INSULIN GLARGINE 100 UNIT/ML ~~LOC~~ SOLN
24.0000 [IU] | Freq: Every day | SUBCUTANEOUS | Status: DC
  Administered 2015-12-19 – 2015-12-20 (×2): 24 [IU] via SUBCUTANEOUS
  Filled 2015-12-19 (×3): qty 0.24

## 2015-12-19 MED ORDER — MULTI-VITAMIN/MINERALS PO TABS: 1.0000 | ORAL_TABLET | Freq: Every day | ORAL | Status: DC

## 2015-12-19 MED ORDER — INSULIN ASPART 100 UNIT/ML ~~LOC~~ SOLN: 3.0000 [IU] | Freq: Once | SUBCUTANEOUS | Status: DC

## 2015-12-19 MED ORDER — PRAVASTATIN SODIUM 20 MG PO TABS
20.0000 mg | ORAL_TABLET | Freq: Every day | ORAL | Status: DC
  Administered 2015-12-19 – 2015-12-20 (×2): 20 mg via ORAL
  Filled 2015-12-19 (×2): qty 1

## 2015-12-19 MED ORDER — DEXTROSE 50 % IV SOLN
25.0000 mL | Freq: Once | INTRAVENOUS | Status: AC
  Administered 2015-12-19: 25 mL via INTRAVENOUS
  Filled 2015-12-19: qty 50

## 2015-12-19 MED ORDER — LANTHANUM CARBONATE 500 MG PO CHEW
500.0000 mg | CHEWABLE_TABLET | Freq: Three times a day (TID) | ORAL | Status: DC
  Administered 2015-12-19 – 2015-12-21 (×6): 500 mg via ORAL
  Filled 2015-12-19 (×8): qty 1

## 2015-12-19 MED ORDER — AMLODIPINE BESYLATE 5 MG PO TABS
5.0000 mg | ORAL_TABLET | Freq: Every day | ORAL | Status: DC
  Administered 2015-12-19 – 2015-12-21 (×3): 5 mg via ORAL
  Filled 2015-12-19 (×3): qty 1

## 2015-12-19 MED ORDER — ACETAMINOPHEN 325 MG PO TABS: 650.0000 mg | ORAL_TABLET | Freq: Four times a day (QID) | ORAL | Status: DC | PRN

## 2015-12-19 MED ORDER — ONDANSETRON HCL 4 MG/2ML IJ SOLN: 4.0000 mg | Freq: Four times a day (QID) | INTRAMUSCULAR | Status: DC | PRN

## 2015-12-19 MED ORDER — LORAZEPAM 2 MG/ML IJ SOLN
1.0000 mg | Freq: Once | INTRAMUSCULAR | Status: AC
  Administered 2015-12-19: 1 mg via INTRAVENOUS
  Filled 2015-12-19: qty 1

## 2015-12-19 MED ORDER — HYDRALAZINE HCL 20 MG/ML IJ SOLN: 10.0000 mg | Freq: Four times a day (QID) | INTRAMUSCULAR | Status: DC | PRN

## 2015-12-19 MED ORDER — ADULT MULTIVITAMIN W/MINERALS CH
1.0000 | ORAL_TABLET | Freq: Every day | ORAL | Status: DC
  Administered 2015-12-19 – 2015-12-21 (×3): 1 via ORAL
  Filled 2015-12-19 (×3): qty 1

## 2015-12-19 MED ORDER — DELFLEX-LC/1.5% DEXTROSE 346 MOSM/L IP SOLN
INTRAPERITONEAL | Status: DC
  Administered 2015-12-19: 6 L via INTRAPERITONEAL
  Filled 2015-12-19 (×3): qty 3000

## 2015-12-19 MED ORDER — INSULIN ASPART 100 UNIT/ML ~~LOC~~ SOLN
SUBCUTANEOUS | Status: AC
  Administered 2015-12-19: 10 [IU] via INTRAVENOUS
  Filled 2015-12-19: qty 10

## 2015-12-19 MED ORDER — GENTAMICIN SULFATE 0.1 % EX CREA
1.0000 "application " | TOPICAL_CREAM | Freq: Every day | CUTANEOUS | Status: DC
  Administered 2015-12-20: 1 via TOPICAL
  Filled 2015-12-19: qty 15

## 2015-12-19 MED ORDER — HEPARIN SODIUM (PORCINE) 5000 UNIT/ML IJ SOLN
5000.0000 [IU] | Freq: Three times a day (TID) | INTRAMUSCULAR | Status: DC
  Administered 2015-12-19 – 2015-12-21 (×7): 5000 [IU] via SUBCUTANEOUS
  Filled 2015-12-19 (×7): qty 1

## 2015-12-19 MED ORDER — ONDANSETRON HCL 4 MG PO TABS: 4.0000 mg | ORAL_TABLET | Freq: Four times a day (QID) | ORAL | Status: DC | PRN

## 2015-12-19 MED ORDER — DEXTROSE 50 % IV SOLN
INTRAVENOUS | Status: AC
  Filled 2015-12-19: qty 50

## 2015-12-19 MED ORDER — TUBERCULIN PPD 5 UNIT/0.1ML ID SOLN
5.0000 [IU] | Freq: Once | INTRADERMAL | Status: AC
  Administered 2015-12-19: 5 [IU] via INTRADERMAL
  Filled 2015-12-19: qty 0.1

## 2015-12-19 MED ORDER — FUROSEMIDE 40 MG PO TABS
40.0000 mg | ORAL_TABLET | Freq: Two times a day (BID) | ORAL | Status: DC
  Administered 2015-12-20 – 2015-12-21 (×4): 40 mg via ORAL
  Filled 2015-12-19 (×4): qty 1

## 2015-12-19 MED ORDER — SODIUM BICARBONATE 8.4 % IV SOLN
50.0000 meq | Freq: Once | INTRAVENOUS | Status: AC
  Administered 2015-12-19: 50 meq via INTRAVENOUS
  Filled 2015-12-19: qty 50

## 2015-12-19 MED ORDER — ACETAMINOPHEN 650 MG RE SUPP: 650.0000 mg | Freq: Four times a day (QID) | RECTAL | Status: DC | PRN

## 2015-12-19 MED ORDER — INSULIN ASPART 100 UNIT/ML IV SOLN
10.0000 [IU] | Freq: Once | INTRAVENOUS | Status: AC
  Administered 2015-12-19: 10 [IU] via INTRAVENOUS
  Filled 2015-12-19: qty 0.1

## 2015-12-19 MED ORDER — DEXTROSE 50 % IV SOLN
1.0000 | Freq: Once | INTRAVENOUS | Status: AC
  Administered 2015-12-19: 50 mL via INTRAVENOUS

## 2015-12-19 MED ORDER — INSULIN GLARGINE 300 UNIT/ML ~~LOC~~ SOPN: 24.0000 [IU] | PEN_INJECTOR | Freq: Every day | SUBCUTANEOUS | Status: DC

## 2015-12-19 NOTE — ED Provider Notes (Signed)
[facility] Emergency Department Provider Note   ____________________________________________  Time seen: Approximately 8:07 AM  I have reviewed the triage vital signs and the nursing notes.   HISTORY  Chief Complaint Shaking and Aphasia    HPI Bethany Lee is a 65 y.o. female who reports that about 4:30 this morning she began shaking and having difficulty speaking. Patient's husband did not notice this until much later during the day. Patient reports she's never had this before. Patient is having some difficulty speaking and prolonged sentences and having episodes of jerking this. They're moderate in nature and do not seem to be brought on by anything seemed to decrease but not resolve upon distracting her. There are no other associated symptoms no headache nausea vomiting shortness of breath or anything else. Patient has a dialysis catheter in her abdomen which is been there for 3 weeks to getting ready to start dialysis.   Past Medical History  Diagnosis Date  . Diabetes mellitus without complication ([place])   . Chronic kidney disease   . Hypertension   . Hypercholesterolemia   . Anemia     Patient Active Problem List   Diagnosis Date Noted  . 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 Anemia in chronic renal disease 10/04/2015  . Proteinuria 05/25/2015    Past Surgical History  Procedure Laterality Date  . Abdominal hysterectomy    . Tubal ligation    . Eye surgery    . Capd insertion N/A 11/25/2015    Procedure: LAPAROSCOPIC INSERTION CONTINUOUS AMBULATORY PERITONEAL DIALYSIS  (CAPD) CATHETER;  Surgeon: Algernon Huxley, MD;  Location: [facility] ORS;  Service: Vascular;  Laterality: N/A;    No current outpatient prescriptions on file.  Allergies Penicillins  History reviewed. No pertinent family history.  Social History Social History  Substance Use Topics  . Smoking status: Former Research scientist (life sciences)  . Smokeless tobacco: None  . Alcohol Use: No    Review  of Systems Constitutional: No fever/chills Eyes: No visual changes. ENT: No sore throat. Cardiovascular: Denies chest pain. Respiratory: Denies shortness of breath. Gastrointestinal: No abdominal pain.  No nausea, no vomiting.  No diarrhea.  No constipation. Genitourinary: Negative for dysuria. Musculoskeletal: Negative for back pain. Skin: Negative for rash. Neurological: Negative for headaches, focal weakness or numbness.  10-point ROS otherwise negative.  ____________________________________________   PHYSICAL EXAM:  VITAL SIGNS: ED Triage Vitals  Enc Vitals Group     BP 12/19/15 0715 188/98 mmHg     Pulse Rate 12/19/15 0715 96     Resp 12/19/15 0715 20     Temp 12/19/15 0715 98.3 F (36.8 C)     Temp Source 12/19/15 0715 Oral     SpO2 12/19/15 0715 95 %     Weight 12/19/15 0715 148 lb (67.132 kg)     Height 12/19/15 0715 5' (1.524 m)     Head Cir --      Peak Flow --      Pain Score --      Pain Loc --      Pain Edu? --      Excl. in [place]? --     Constitutional: Alert and oriented. Well appearing and in no acute distress. Eyes: Conjunctivae are normal. PERRL. EOMI. Head: Atraumatic. Nose: No congestion/rhinnorhea. Mouth/Throat: Mucous membranes are moist.  Oropharynx non-erythematous. Neck: No stridor.   Cardiovascular: Normal rate, regular rhythm. Grossly normal heart sounds.  Good peripheral circulation. Respiratory: Normal respiratory effort.  No retractions. Lungs CTAB. Gastrointestinal:  Soft and nontender. No distention. No abdominal bruits. No CVA tenderness. Musculoskeletal: No lower extremity tenderness 1+ edema No joint effusions. Neurologic:  Patient is having spontaneous jerkiness of arms legs head etc. Patient's speech is halting but clear. Renal nerves II through XII are intact. Finger to nose is occasionally interrupted by the jerks but she is able to touch both my finger in her nose. Rapid alternating movements the same. Motor strength is 5 over 5  throughout sensation appears to be intact. Skin:  Skin is warm, dry and intact. No rash noted.   ____________________________________________   LABS (all labs ordered are listed, but only abnormal results are displayed)  Labs Reviewed  CBC - Abnormal; Notable for the following:    RBC 3.06 (*)    Hemoglobin 8.6 (*)    HCT 26.1 (*)    RDW 15.5 (*)    All other components within normal limits  COMPREHENSIVE METABOLIC PANEL - Abnormal; Notable for the following:    Potassium 6.1 (*)    CO2 20 (*)    Glucose, Bld 123 (*)    BUN 94 (*)    Creatinine, Ser 8.66 (*)    Calcium 7.6 (*)    Albumin 3.2 (*)    ALT 13 (*)    GFR calc non Af Amer 4 (*)    GFR calc Af Amer 5 (*)    All other components within normal limits  BRAIN NATRIURETIC PEPTIDE - Abnormal; Notable for the following:    B Natriuretic Peptide 376.0 (*)    All other components within normal limits  GLUCOSE, CAPILLARY - Abnormal; Notable for the following:    Glucose-Capillary 125 (*)    All other components within normal limits  GLUCOSE, CAPILLARY - Abnormal; Notable for the following:    Glucose-Capillary 31 (*)    All other components within normal limits  GLUCOSE, CAPILLARY - Abnormal; Notable for the following:    Glucose-Capillary 171 (*)    All other components within normal limits  PROTIME-INR  APTT  DIFFERENTIAL  TROPONIN I  TROPONIN I  URINALYSIS COMPLETEWITH MICROSCOPIC ([facility] ONLY)  CBG MONITORING, ED   ____________________________________________  EKG  EKG read and interpreted by me shows normal sinus rhythm rate of 96 normal axis no acute ST-T wave changes ____________________________________________  RADIOLOGY  cT the head by my memory was read as read by radiology as no acute disease ____________________________________________   PROCEDURES  Patient received 1 mg of Ativan IV. She went to sleep and slept for about 2 hours when she woke up she no longer had the jerking  episodes.  ____________________________________________   INITIAL IMPRESSION / ASSESSMENT AND PLAN / ED COURSE  Pertinent labs & imaging results that were available during my care of the patient were reviewed by me and considered in my medical decision making (see chart for details).  Because of the patient's previous history of jerking episodes her markedly elevated BUN and creatinine and potassium ____________________________________________   FINAL CLINICAL IMPRESSION(S) / ED DIAGNOSES  Final diagnoses:  Serum potassium elevated  Altered mental status, unspecified altered mental status type      NEW MEDICATIONS STARTED DURING THIS VISIT:  Current Discharge Medication List       Note:  This document was prepared using Dragon voice recognition software and may include unintentional dictation errors.    Nena Polio, MD 12/19/15 [id number]

## 2015-12-19 NOTE — Progress Notes (Signed)
[facility]  ROUNDING NOTE   Subjective:  Patient well known to [place] from the office. She is regularly followed by Dr. Juleen China. She had peritoneal dialysis catheter placed on 11/25/15. She also has history of known diabetic nephropathy. She presents now with myoclonic jerks, tremors, and lethargy. Most recent EGFR in the office was 5.    Objective:  Vital signs in last 24 hours:  Temp:  [98.1 F (36.7 C)-98.3 F (36.8 C)] 98.1 F (36.7 C) (05/28 1416) Pulse Rate:  [73-96] 78 (05/28 1610) Resp:  [13-30] 16 (05/28 1407) BP: (117-204)/(73-185) 169/86 mmHg (05/28 1610) SpO2:  [95 %-100 %] 96 % (05/28 1610) Weight:  [67.132 kg (148 lb)-72.258 kg (159 lb 4.8 oz)] 72.258 kg (159 lb 4.8 oz) (05/28 1416)  Weight change:  Filed Weights   12/19/15 0715 12/19/15 1416  Weight: 67.132 kg (148 lb) 72.258 kg (159 lb 4.8 oz)    Intake/Output:     Intake/Output this shift:  Total I/O In: -  Out: 500 [Urine:500]  Physical Exam: General: NAD, resting in bed  Head: Normocephalic, atraumatic. Moist oral mucosal membranes  Eyes: Anicteric  Neck: Supple, trachea midline  Lungs:  Clear to auscultation normal effort  Heart: Regular rate and rhythm, no obvious rub  Abdomen:  Soft, nontender, BS present  Extremities: no peripheral edema.  Neurologic: Nonfocal, moving all four extremities, tremors noted  Skin: No lesions  Access: PD catheter in place, no drainage    Basic Metabolic Panel:  Recent Labs Lab 12/19/15 0716  NA 138  K 6.1*  CL 106  CO2 20*  GLUCOSE 123*  BUN 94*  CREATININE 8.66*  CALCIUM 7.6*    Liver Function Tests:  Recent Labs Lab 12/19/15 0716  AST 15  ALT 13*  ALKPHOS 61  BILITOT 0.5  PROT 6.5  ALBUMIN 3.2*   No results for input(s): LIPASE, AMYLASE in the last 168 hours. No results for input(s): AMMONIA in the last 168 hours.  CBC:  Recent Labs Lab 12/19/15 0716  WBC 8.1  NEUTROABS 6.2  HGB 8.6*  HCT 26.1*  MCV 85.4  PLT 210     Cardiac Enzymes:  Recent Labs Lab 12/19/15 0716 12/19/15 1200  TROPONINI 0.03 <0.03    BNP: Invalid input(s): POCBNP  CBG:  Recent Labs Lab 12/19/15 0710 12/19/15 1242 12/19/15 1253  GLUCAP 125* 62* 171*    Microbiology: Results for orders placed or performed during the hospital encounter of 11/18/15  Surgical pcr screen     Status: None   Collection Time: 11/18/15 11:07 AM  Result Value Ref Range Status   MRSA, PCR NEGATIVE NEGATIVE Final   Staphylococcus aureus NEGATIVE NEGATIVE Final    Comment:        The Xpert SA Assay (FDA approved for NASAL specimens in patients over 52 years of age), is one component of a comprehensive surveillance program.  Test performance has been validated by [facility] for patients greater than or equal to 71 year old. It is not intended to diagnose infection nor to guide or monitor treatment.     Coagulation Studies:  Recent Labs  12/19/15 0716  LABPROT 13.0  INR 0.96    Urinalysis: No results for input(s): COLORURINE, LABSPEC, PHURINE, GLUCOSEU, HGBUR, BILIRUBINUR, KETONESUR, PROTEINUR, UROBILINOGEN, NITRITE, LEUKOCYTESUR in the last 72 hours.  Invalid input(s): APPERANCEUR    Imaging: Ct Head Wo Contrast  12/19/2015  CLINICAL DATA:  Altered mental status.  Progressive shaking. EXAM: CT HEAD WITHOUT CONTRAST TECHNIQUE: Contiguous  axial images were obtained from the base of the skull through the vertex without intravenous contrast. COMPARISON:  None. FINDINGS: Diffusely enlarged ventricles and subarachnoid spaces. Mid Patchy white matter low density in both cerebral hemispheres. No intracranial hemorrhage, mass lesion or CT evidence of acute infarction. Unremarkable bones right maxillary sinus retention cysts. Mild left maxillary sinus mucosal thickening. IMPRESSION: 1. No acute abnormality. 2. Minimal diffuse cerebral and cerebellar atrophy. 3. Minimal chronic small vessel white matter ischemic changes in both  cerebral hemispheres. 4. Mild chronic left maxillary sinusitis. Electronically Signed   By: Claudie Revering M.D.   On: 12/19/2015 08:52     Medications:     . amLODipine  5 mg Oral Daily  . aspirin EC  81 mg Oral Daily  . [START ON 12/20/2015] furosemide  40 mg Oral BID WC  . heparin  5,000 Units Subcutaneous Q8H  . insulin aspart  0-9 Units Subcutaneous TID WC  . insulin glargine  24 Units Subcutaneous QHS  . lanthanum  500 mg Oral TID WC  . multivitamin with minerals  1 tablet Oral Daily  . omega-3 acid ethyl esters  1,000 mg Oral BID  . pravastatin  20 mg Oral q1800   acetaminophen **OR** acetaminophen, hydrALAZINE, ondansetron **OR** ondansetron (ZOFRAN) IV  Assessment/ Plan:  65 y.o. female with diabetes mellitus type 2, diabetic neuropathy, anemia, hypertension, hyperlipidemia, diabetic nephropathy, ESRD due to DM,  PD catheter placed 11/25/15, anemia of CKD, SHPTH  1. 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 The patient had recent peritoneal dialysis catheter placed. She now has myoclonic jerks which is likely secondary to decreased renal clearance of gabapentin. She is clearly in need of initiation of peritoneal dialysis. We will plan for a low volume peritoneal dialysis given recent peritoneal dialysis catheter placement. She will need placement in an outpatient dialysis unit in [place] under the care of Dr. Juleen China.  Plan was discussed in depth with the patient and her husband.  2. Myoclonic jerks. Secondary to decreased renal clearance of gabapentin. Agree with discontinuation of gabapentin. Hopefully tremors and myoclonic jerks should stop once we have started the patient on peritoneal dialysis.  3. Anemia of chronic kidney disease. We will start the patient on Epogen 20,000 units weekly while in the hospital.  4. Secondary hyperparathyroidism. We will maintain the patient on lanthanum for now. She will need continued monitoring of her parathyroid hormone as an  outpatient.  5. Hypertension. Blood pressure currently 169/86. Once hyperkalemia has been treated we will likely start the patient on an ARB as well.  6. Hyperkalemia. Likely secondary to end-stage renal disease. Peritoneal dialysis should help to treat this as well.    LOS: 0 Shakti Fleer 5/28/20174:36 PM

## 2015-12-19 NOTE — ED Notes (Signed)
Notified by NT pt's CBG 31. Pt given D50 by float RN, CBG at 1253 171. Attending MD notified.

## 2015-12-19 NOTE — H&P (Addendum)
[facility] [place] NAME: Bethany Lee    MR#:  [id number]  DATE OF BIRTH:  [birth year]  DATE OF ADMISSION:  12/19/2015  PRIMARY CARE PHYSICIAN: Tracie Harrier, MD   REQUESTING/REFERRING PHYSICIAN: Dr Cinda Quest  CHIEF COMPLAINT:  Not feeling well generalized weakness and bilateral upper extremity jerky moments   HISTORY OF PRESENT ILLNESS:  Bethany Lee  is a 65 y.o. female with a known history of End-stage renal disease who recently had a peritoneal catheter placed in to initiate hemodialysis, diabetic glomerulosclerosis causing end-stage renal disease, hypertension, anemia of chronic disease comes to the emergency room accompanied by her husband.  Patient currently is sedated from receiving IV Ativan earlier she is unable to give any history of review of system  Husband reports patient woke up around 4:30 AM not feeling well started having weakness and then bilateral upper extremity jerky movements. No nausea vomiting diarrhea fever. Denies any slurred speech.  Patient was found to have potassium of 6.1. Her creatinine is 8.6.  CT head negative for stroke. She is being admitted for end-stage renal disease, hyperkalemia, myoclonic jerks  PAST MEDICAL HISTORY:   Past Medical History  Diagnosis Date  . Diabetes mellitus without complication ([place])   . Chronic kidney disease   . Hypertension   . Hypercholesterolemia   . Anemia     PAST SURGICAL HISTOIRY:   Past Surgical History  Procedure Laterality Date  . Abdominal hysterectomy    . Tubal ligation    . Eye surgery    . Capd insertion N/A 11/25/2015    Procedure: LAPAROSCOPIC INSERTION CONTINUOUS AMBULATORY PERITONEAL DIALYSIS  (CAPD) CATHETER;  Surgeon: Algernon Huxley, MD;  Location: [facility] ORS;  Service: Vascular;  Laterality: N/A;    SOCIAL HISTORY:   Social History  Substance Use Topics  . Smoking status: Former Research scientist (life sciences)  . Smokeless tobacco: Not on file  . Alcohol Use: No     FAMILY HISTORY:  No family history on file.  DRUG ALLERGIES:   Allergies  Allergen Reactions  . Penicillins     REVIEW OF SYSTEMS:  Review of Systems  Unable to perform ROS: mental acuity     MEDICATIONS AT HOME:   Prior to Admission medications   Medication Sig Start Date End Date Taking? Authorizing Provider  amLODipine (NORVASC) 5 MG tablet Take 5 mg by mouth daily.   Yes Historical Provider, MD  aspirin EC 81 MG tablet Take 1 tablet (81 mg total) by mouth daily. Start taking after NOV 15 05/26/15  Yes Harmeet Candiss Norse, MD  calcium acetate (PHOSLO) 667 MG capsule Take 1,334 mg by mouth 3 (three) times daily with meals.   Yes Historical Provider, MD  furosemide (LASIX) 40 MG tablet Take 1 tablet (40 mg total) by mouth 2 (two) times daily with breakfast and lunch. Take as needed for Leg swelling 05/26/15  Yes Harmeet Candiss Norse, MD  gabapentin (NEURONTIN) 300 MG capsule Take 300 mg by mouth 3 (three) times daily.   Yes Historical Provider, MD  insulin aspart (NOVOLOG FLEXPEN) 100 UNIT/ML FlexPen 4-8 Units 3 (three) times daily with meals.  09/13/15  Yes Historical Provider, MD  Insulin Glargine (TOUJEO SOLOSTAR) 300 UNIT/ML SOPN Inject 24 Units into the skin at bedtime.   Yes Historical Provider, MD  Lanthanum Carbonate (FOSRENOL PO) Take 1 tablet by mouth 3 (three) times daily with meals.   Yes Historical Provider, MD  lovastatin (MEVACOR) 20 MG tablet Take 20 mg by mouth  at bedtime.   Yes Historical Provider, MD  Multiple Vitamins-Minerals (MULTIVITAMIN WITH MINERALS) tablet Take 1 tablet by mouth daily.   Yes Historical Provider, MD  omega-3 acid ethyl esters (LOVAZA) 1 G capsule Take 1,200 mg by mouth 2 (two) times daily.   Yes Historical Provider, MD  sodium bicarbonate 650 MG tablet Take 1,300 mg by mouth 2 (two) times daily.   Yes Historical Provider, MD  VITAMIN D, ERGOCALCIFEROL, PO Take 1 tablet by mouth daily.   Yes Historical Provider, MD      VITAL SIGNS:  Blood pressure  148/80, pulse 74, temperature 98.3 F (36.8 C), temperature source Oral, resp. rate 17, height 5' (1.524 m), weight 67.132 kg (148 lb), SpO2 99 %.  PHYSICAL EXAMINATION:  GENERAL:  65 y.o.-year-old patient lying in the bed with no acute distress.  EYES: Pupils equal, round, reactive to light and accommodation. No scleral icterus. Extraocular muscles intact.  HEENT: Head atraumatic, normocephalic. Oropharynx and nasopharynx clear.  NECK:  Supple, no jugular venous distention. No thyroid enlargement, no tenderness.  LUNGS: Normal breath sounds bilaterally, no wheezing, rales,rhonchi or crepitation. No use of accessory muscles of respiration.  CARDIOVASCULAR: S1, S2 normal. No murmurs, rubs, or gallops.  ABDOMEN: Soft, nontender, nondistended. Bowel sounds present. No organomegaly or mass.  EXTREMITIES: No pedal edema, cyanosis, or clubbing.  NEUROLOGIC: unable to assess pt sedated with IV ativan PSYCHIATRIC: sedated at present SKIN: No obvious rash, lesion, or ulcer.   LABORATORY PANEL:   CBC  Recent Labs Lab 12/19/15 0716  WBC 8.1  HGB 8.6*  HCT 26.1*  PLT 210   ------------------------------------------------------------------------------------------------------------------  Chemistries   Recent Labs Lab 12/19/15 0716  NA 138  K 6.1*  CL 106  CO2 20*  GLUCOSE 123*  BUN 94*  CREATININE 8.66*  CALCIUM 7.6*  AST 15  ALT 13*  ALKPHOS 61  BILITOT 0.5   ------------------------------------------------------------------------------------------------------------------  Cardiac Enzymes  Recent Labs Lab 12/19/15 0716  TROPONINI 0.03   ------------------------------------------------------------------------------------------------------------------  RADIOLOGY:  Ct Head Wo Contrast  12/19/2015  CLINICAL DATA:  Altered mental status.  Progressive shaking. EXAM: CT HEAD WITHOUT CONTRAST TECHNIQUE: Contiguous axial images were obtained from the base of the skull through  the vertex without intravenous contrast. COMPARISON:  None. FINDINGS: Diffusely enlarged ventricles and subarachnoid spaces. Mid Patchy white matter low density in both cerebral hemispheres. No intracranial hemorrhage, mass lesion or CT evidence of acute infarction. Unremarkable bones right maxillary sinus retention cysts. Mild left maxillary sinus mucosal thickening. IMPRESSION: 1. No acute abnormality. 2. Minimal diffuse cerebral and cerebellar atrophy. 3. Minimal chronic small vessel white matter ischemic changes in both cerebral hemispheres. 4. Mild chronic left maxillary sinusitis. Electronically Signed   By: Claudie Revering M.D.   On: 12/19/2015 08:52    EKG:   s tachycardia, PVC. No peaked T waves IMPRESSION AND PLAN:    Bethany Lee  is a 65 y.o. female with a known history of End-stage renal disease who recently had a peritoneal catheter placed in to initiate hemodialysis, diabetic glomerulosclerosis causing end-stage renal disease, hypertension, anemia of chronic disease comes to the emergency room accompanied by her husband.  Patient currently is sedated from receiving IV Ativan earlier she is unable to give any history of review of system  Husband reports patient woke up around 4:30 AM not feeling well started having weakness and then bilateral upper extremity jerky movements  1. Acute hyperkalemia secondary to end-stage renal disease due to diabetic glomerulosclerosis (kidney biopsy results November 2016 ) -  IV dextrose with insulin and IV bicarbonate given in the emergency room  -Discussed with nephrology Dr. Holley Raring to see if patient could be initiated on dialysis. Await further recommendations  -Follow up metabolic panel   2. Myoclonic jerks bilateral upper extremity  -Suspected side effects from gabapentin with word renal clearance  -Discontinue gabapentin  -Patient received IV Ativan on arrival to the emergency room she is currently sedated   3. End-stage renal disease due to  diabetic glomerulosclerosis  -Patient has had peritoneal dialysis catheter placed on 11/25/2015   4. Hypertension, uncontrolled  Continue home meds, IV hydralazine as needed   5. Type 2 diabetes  -Sliding-scale insulin for now  -We'll resume insulin once patient is more awake and able to take oral diet   6. DVT prophylaxis  Subcutaneous heparin  All the records are reviewed and case discussed with ED provider. Management plans discussed with the  family and they are in agreement.  CODE STATUS: full  TOTAL critical TIME TAKING CARE OF THIS PATIENT: 80minutes.    Jeson Camacho M.D on 12/19/2015 at 11:58 AM  Between 7am to 6pm - Pager - [phone]  After 6pm go to [email or web address] - password [email or web address] [email or web address]  Office  [phone]  CC: Primary care physician; Tracie Harrier, MD

## 2015-12-19 NOTE — ED Notes (Signed)
MD Malinda at bedside. 

## 2015-12-19 NOTE — Progress Notes (Signed)
Pre-Dialysis assessment. 

## 2015-12-19 NOTE — ED Notes (Signed)
Patient presents to the ED with shaking, difficulty speaking, anxiety.  Patient is alert and able to questions at this time.  Patient reports waking up at 4am, shaking some but states shaking is getting worse.  Patient has history of diabetes and kidney failure.  Husband states patient has an abdominal dialysis catheter that has not been used so far.  Patient has 5% kidney function per husband.

## 2015-12-19 NOTE — ED Notes (Signed)
Husband extremely upset, berating staff. Husband states: "Is her nurse the numbest one in the staff, why did you drive her sugar so low and so high again. This is bullshit medicine". Husband requesting to speak to MD, MD notified.

## 2015-12-20 LAB — GLUCOSE, CAPILLARY
Glucose-Capillary: 66 mg/dL (ref 65–99)
Glucose-Capillary: 84 mg/dL (ref 65–99)
Glucose-Capillary: 88 mg/dL (ref 65–99)
Glucose-Capillary: 118 mg/dL — ABNORMAL HIGH (ref 65–99)
Glucose-Capillary: 161 mg/dL — ABNORMAL HIGH (ref 65–99)

## 2015-12-20 LAB — BASIC METABOLIC PANEL
Anion gap: 9 (ref 5–15)
BUN: 86 mg/dL — AB (ref 6–20)
CALCIUM: 7.6 mg/dL — AB (ref 8.9–10.3)
CO2: 24 mmol/L (ref 22–32)
CREATININE: 8.73 mg/dL — AB (ref 0.44–1.00)
Chloride: 108 mmol/L (ref 101–111)
GFR calc non Af Amer: 4 mL/min — ABNORMAL LOW (ref >60)
GFR, EST AFRICAN AMERICAN: 5 mL/min — AB (ref >60)
Glucose, Bld: 49 mg/dL — ABNORMAL LOW (ref 65–99)
Potassium: 4.6 mmol/L (ref 3.5–5.1)
SODIUM: 141 mmol/L (ref 135–145)

## 2015-12-20 NOTE — Progress Notes (Signed)
Connected to peritoneal cycler.

## 2015-12-20 NOTE — Progress Notes (Signed)
Pre-peritoneal dialysis treatment

## 2015-12-20 NOTE — Progress Notes (Signed)
PT Cancellation Note  Patient Details Name: Bethany Lee MRN: [id number] DOB: 1951-04-16   Cancelled Treatment:    Reason Eval/Treat Not Completed: Patient at procedure or test/unavailable (Getting HD when PT arrived).  Try again later as time allows.   Ramond Dial 12/20/2015, 1:52 PM   Mee Hives, PT MS Acute Rehab Dept. Number: [place] [facility] [place]

## 2015-12-20 NOTE — Care Management (Signed)
Patient presents from home.  Lives with her husband and independent in her adls.  No DME in the home.  She had a peritoneal dialysis catheter placed 5/4.  She presented with muscled tremors and jerking.  Creatinine and BUN elevated. Spoke with Dr Holley Raring.  Patient has received her first PD treatment 5/28.    CM notified dialysis coordinator of new PD.  Patient denies any problems at home that would prevent her and husband from  performing in home PD.  She also denies issues accessing medical care; is current with her pcp, no issues with transportation or obtaining meds.

## 2015-12-20 NOTE — Progress Notes (Signed)
[facility] [place] NAME: Bethany Lee    MR#:  [id number]  DATE OF BIRTH:  1950-08-02  SUBJECTIVE:  CHIEF COMPLAINT:   Chief Complaint  Patient presents with  . Shaking  . Aphasia  Patient is a 65 year old female with past medical history significant with a history of incisional disease who has the peritoneal catheter placed to be initiated on peritoneal dialysis about 2 months ago, diabetes, essential hypertension, anemia of chronic disease who presents to the hospital with complaints of shakiness twitching in the upper extremities and body. She was felt to have uremia with a likely a gabapentin intoxication, she was initiated on peritoneal dialysis, and feels better today. She is to have 3 peritoneal dialysis sessions before she is to be released to outpatient clinic  Review of Systems  Constitutional: Negative for fever, chills and weight loss.  HENT: Negative for congestion.   Eyes: Negative for blurred vision and double vision.  Respiratory: Negative for cough, sputum production, shortness of breath and wheezing.   Cardiovascular: Negative for chest pain, palpitations, orthopnea, leg swelling and PND.  Gastrointestinal: Negative for nausea, vomiting, abdominal pain, diarrhea, constipation and blood in stool.  Genitourinary: Negative for dysuria, urgency, frequency and hematuria.  Musculoskeletal: Negative for falls.  Neurological: Positive for tremors. Negative for dizziness, focal weakness and headaches.  Endo/Heme/Allergies: Does not bruise/bleed easily.  Psychiatric/Behavioral: Negative for depression. The patient does not have insomnia.     VITAL SIGNS: Blood pressure 130/72, pulse 83, temperature 97.8 F (36.6 C), temperature source Oral, resp. rate 19, height 5' (1.524 m), weight 69.31 kg (152 lb 12.8 oz), SpO2 97 %.  PHYSICAL EXAMINATION:   GENERAL:  65 y.o.-year-old patient lying in the bed with no acute distress. Mild  hand/finger tremor, I do not appreciate no myoclonus or any muscle twitching EYES: Pupils equal, round, reactive to light and accommodation. No scleral icterus. Extraocular muscles intact.  HEENT: Head atraumatic, normocephalic. Oropharynx and nasopharynx clear.  NECK:  Supple, no jugular venous distention. No thyroid enlargement, no tenderness.  LUNGS: Normal breath sounds bilaterally, no wheezing, rales,rhonchi or crepitation. No use of accessory muscles of respiration.  CARDIOVASCULAR: S1, S2 normal. No murmurs, rubs, or gallops.  ABDOMEN: Soft, nontender, nondistended. Bowel sounds present. No organomegaly or mass. Peritoneal dialysis is placed in her left mid to lower abdominal area EXTREMITIES: 1+ lower extremity and pedal edema, cyanosis, or clubbing.  NEUROLOGIC: Cranial nerves II through XII are intact. Muscle strength 5/5 in all extremities. Sensation intact. Gait not checked.  PSYCHIATRIC: The patient is alert and oriented x 3.  SKIN: No obvious rash, lesion, or ulcer.   ORDERS/RESULTS REVIEWED:   CBC  Recent Labs Lab 12/19/15 0716  WBC 8.1  HGB 8.6*  HCT 26.1*  PLT 210  MCV 85.4  MCH 28.1  MCHC 32.9  RDW 15.5*  LYMPHSABS 1.0  MONOABS 0.4  EOSABS 0.4  BASOSABS 0.1   ------------------------------------------------------------------------------------------------------------------  Chemistries   Recent Labs Lab 12/19/15 0716 12/20/15 0501  NA 138 141  K 6.1* 4.6  CL 106 108  CO2 20* 24  GLUCOSE 123* 49*  BUN 94* 86*  CREATININE 8.66* 8.73*  CALCIUM 7.6* 7.6*  AST 15  --   ALT 13*  --   ALKPHOS 61  --   BILITOT 0.5  --    ------------------------------------------------------------------------------------------------------------------ estimated creatinine clearance is 5.5 mL/min (by C-G formula based on Cr of 8.73). ------------------------------------------------------------------------------------------------------------------ No results for input(s):  TSH, T4TOTAL,  T3FREE, THYROIDAB in the last 72 hours.  Invalid input(s): FREET3  Cardiac Enzymes  Recent Labs Lab 12/19/15 0716 12/19/15 1200  TROPONINI 0.03 <0.03   ------------------------------------------------------------------------------------------------------------------ Invalid input(s): POCBNP ---------------------------------------------------------------------------------------------------------------  RADIOLOGY: Ct Head Wo Contrast  12/19/2015  CLINICAL DATA:  Altered mental status.  Progressive shaking. EXAM: CT HEAD WITHOUT CONTRAST TECHNIQUE: Contiguous axial images were obtained from the base of the skull through the vertex without intravenous contrast. COMPARISON:  None. FINDINGS: Diffusely enlarged ventricles and subarachnoid spaces. Mid Patchy white matter low density in both cerebral hemispheres. No intracranial hemorrhage, mass lesion or CT evidence of acute infarction. Unremarkable bones right maxillary sinus retention cysts. Mild left maxillary sinus mucosal thickening. IMPRESSION: 1. No acute abnormality. 2. Minimal diffuse cerebral and cerebellar atrophy. 3. Minimal chronic small vessel white matter ischemic changes in both cerebral hemispheres. 4. Mild chronic left maxillary sinusitis. Electronically Signed   By: Claudie Revering M.D.   On: 12/19/2015 08:52    EKG:  Orders placed or performed during the hospital encounter of 12/19/15  . EKG 12-Lead  . EKG 12-Lead  . ED EKG  . ED EKG  . EKG 12-Lead  . EKG 12-Lead    ASSESSMENT AND PLAN:  Active Problems:   Myoclonic jerking #1 end-stage renal disease, just initiated was peritoneal dialysis, to be continued for at least 3 sessions before patient is discharged to community, per nephrologist #2 myoclonus, likely due to gabapentin intoxication, now off gabapentin, myoclonus has subsided significantly , continue peritoneal dialysis, may be able to resume gabapentin at a low-dose upon discharge home #3 lower  extremity swelling, due to fluid overload, patient is receiving dialysis now, swelling has subsided, per patient's observation #4 hypokalemia, resolved was peritoneal dialysis #5. Diabetes mellitus type 2 was then very chronic kidney disease, blood glucose levels are ranging between 66-160, continue insulin Lantus, sliding scale, adjust it as needed #6 anemia chronic disease, status post Epogen injection  Management plans discussed with the patient, family and they are in agreement.   DRUG ALLERGIES:  Allergies  Allergen Reactions  . Penicillins     CODE STATUS:     Code Status Orders        Start     Ordered   12/19/15 1416  Full code   Continuous     12/19/15 1415    Code Status History    Date Active Date Inactive Code Status Order ID Comments User Context   This patient has a current code status but no historical code status.      TOTAL TIME TAKING CARE OF THIS PATIENT: 40 minutes.    Theodoro Grist M.D on 12/20/2015 at 3:15 PM  Between 7am to 6pm - Pager - [phone]  After 6pm go to [email or web address] - password [email or web address] [facility]  Office  [phone]  CC: Primary care physician; Tracie Harrier, MD

## 2015-12-20 NOTE — Progress Notes (Signed)
[facility]  ROUNDING NOTE   Subjective:  Patient tolerated first peritoneal dialysis treatment quite well. No significant ultrafiltration performed. Discussed care of the patient with care management.   Objective:  Vital signs in last 24 hours:  Temp:  [97.4 F (36.3 C)-98.3 F (36.8 C)] 97.8 F (36.6 C) (05/29 1315) Pulse Rate:  [78-86] 83 (05/29 1315) Resp:  [17-19] 19 (05/29 1315) BP: (126-173)/(69-86) 130/72 mmHg (05/29 1315) SpO2:  [89 %-97 %] 97 % (05/29 1315) Weight:  [69.31 kg (152 lb 12.8 oz)] 69.31 kg (152 lb 12.8 oz) (05/29 0600)  Weight change:  Filed Weights   12/19/15 0715 12/19/15 1416 12/20/15 0600  Weight: 67.132 kg (148 lb) 72.258 kg (159 lb 4.8 oz) 69.31 kg (152 lb 12.8 oz)    Intake/Output: I/O last 3 completed shifts: In: 60 [Other:60] Out: 1200 [Urine:1200]   Intake/Output this shift:  Total I/O In: 480 [P.O.:480] Out: 593 [Urine:500; Other:93]  Physical Exam: General: NAD, resting in bed  Head: Normocephalic, atraumatic. Moist oral mucosal membranes  Eyes: Anicteric  Neck: Supple, trachea midline  Lungs:  Clear to auscultation normal effort  Heart: Regular rate and rhythm, no obvious rub  Abdomen:  Soft, nontender, BS present  Extremities: no peripheral edema.  Neurologic: Nonfocal, moving all four extremities  Skin: No lesions  Access: PD catheter in place, no drainage    Basic Metabolic Panel:  Recent Labs Lab 12/19/15 0716 12/20/15 0501  NA 138 141  K 6.1* 4.6  CL 106 108  CO2 20* 24  GLUCOSE 123* 49*  BUN 94* 86*  CREATININE 8.66* 8.73*  CALCIUM 7.6* 7.6*    Liver Function Tests:  Recent Labs Lab 12/19/15 0716  AST 15  ALT 13*  ALKPHOS 61  BILITOT 0.5  PROT 6.5  ALBUMIN 3.2*   No results for input(s): LIPASE, AMYLASE in the last 168 hours. No results for input(s): AMMONIA in the last 168 hours.  CBC:  Recent Labs Lab 12/19/15 0716  WBC 8.1  NEUTROABS 6.2  HGB 8.6*  HCT 26.1*  MCV 85.4   PLT 210    Cardiac Enzymes:  Recent Labs Lab 12/19/15 0716 12/19/15 1200  TROPONINI 0.03 <0.03    BNP: Invalid input(s): POCBNP  CBG:  Recent Labs Lab 12/19/15 1642 12/19/15 2054 12/20/15 0745 12/20/15 0817 12/20/15 1130  GLUCAP 110* 148* 66 27 88    Microbiology: Results for orders placed or performed during the hospital encounter of 11/18/15  Surgical pcr screen     Status: None   Collection Time: 11/18/15 11:07 AM  Result Value Ref Range Status   MRSA, PCR NEGATIVE NEGATIVE Final   Staphylococcus aureus NEGATIVE NEGATIVE Final    Comment:        The Xpert SA Assay (FDA approved for NASAL specimens in patients over 21 years of age), is one component of a comprehensive surveillance program.  Test performance has been validated by [facility] for patients greater than or equal to 21 year old. It is not intended to diagnose infection nor to guide or monitor treatment.     Coagulation Studies:  Recent Labs  12/19/15 0716  LABPROT 13.0  INR 0.96    Urinalysis:  Recent Labs  12/19/15 1956  COLORURINE STRAW*  LABSPEC 1.009  PHURINE 8.0  GLUCOSEU 150*  HGBUR NEGATIVE  BILIRUBINUR NEGATIVE  KETONESUR NEGATIVE  PROTEINUR >500*  NITRITE NEGATIVE  LEUKOCYTESUR NEGATIVE      Imaging: Ct Head Wo Contrast  12/19/2015  CLINICAL DATA:  Altered mental status.  Progressive shaking. EXAM: CT HEAD WITHOUT CONTRAST TECHNIQUE: Contiguous axial images were obtained from the base of the skull through the vertex without intravenous contrast. COMPARISON:  None. FINDINGS: Diffusely enlarged ventricles and subarachnoid spaces. Mid Patchy white matter low density in both cerebral hemispheres. No intracranial hemorrhage, mass lesion or CT evidence of acute infarction. Unremarkable bones right maxillary sinus retention cysts. Mild left maxillary sinus mucosal thickening. IMPRESSION: 1. No acute abnormality. 2. Minimal diffuse cerebral and cerebellar atrophy. 3.  Minimal chronic small vessel white matter ischemic changes in both cerebral hemispheres. 4. Mild chronic left maxillary sinusitis. Electronically Signed   By: Claudie Revering M.D.   On: 12/19/2015 08:52     Medications:     . amLODipine  5 mg Oral Daily  . aspirin EC  81 mg Oral Daily  . dialysis solution 1.5% low-MG/low-CA   Intraperitoneal Q24H  . epoetin (EPOGEN/PROCRIT) injection  20,000 Units Subcutaneous Q14 Days  . furosemide  40 mg Oral BID WC  . gentamicin cream  1 application Topical Daily  . heparin  5,000 Units Subcutaneous Q8H  . insulin aspart  0-9 Units Subcutaneous TID WC  . insulin glargine  24 Units Subcutaneous QHS  . lanthanum  500 mg Oral TID WC  . multivitamin with minerals  1 tablet Oral Daily  . omega-3 acid ethyl esters  1,000 mg Oral BID  . pravastatin  20 mg Oral q1800  . tuberculin  5 Units Intradermal Once   acetaminophen **OR** acetaminophen, hydrALAZINE, ondansetron **OR** ondansetron (ZOFRAN) IV  Assessment/ Plan:  65 y.o. female with diabetes mellitus type 2, diabetic neuropathy, anemia, hypertension, hyperlipidemia, diabetic nephropathy, ESRD due to DM,  PD catheter placed 11/25/15, anemia of CKD, SHPTH  1. 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 The patient had recent peritoneal dialysis catheter placed. She Presented with myoclonic jerks which is likely secondary to decreased renal clearance of gabapentin. She was clearly in need of initiation of peritoneal dialysis. - Patient has tolerated initiation of peritoneal dialysis quite well. We will continue low volume peritoneal dialysis. She will need placement in our PD clinic in [place] under the care of Dr. Juleen China.  2. Myoclonic jerks. Secondary to decreased renal clearance of gabapentin. Agree with discontinuation of gabapentin. Hopefully tremors and myoclonic jerks should stop once we have started the patient on peritoneal dialysis.  3. Anemia of chronic kidney disease. Patient  started on Epogen. She will require this as an outpatient as well.  4. Secondary hyperparathyroidism. Continue the patient on lanthanum for now.  5. Hypertension. Blood pressure down to 1:30/72.  Continue amlodipine. Can consider ARB as an outpatient.  6. Hyperkalemia. Likely secondary to end-stage renal disease. Potassium down to 4.6. Continue to monitor serum potassium.   LOS: 1 Sanel Stemmer 5/29/20173:17 PM

## 2015-12-20 NOTE — Progress Notes (Signed)
POST HD 

## 2015-12-20 NOTE — Progress Notes (Signed)
Patient on PD, machine was alarming and screen stated that there was insufficient amount draining.  Called Eduard Clos (he was not on call but name was on board) he told me to place patient on left side and raise bed above machine in order to get fluid to drain.  This worked for about 30 minutes and then alarmed again.  Called nursing supervisor after I was unable to reach anybody in dialysis.  Called Tonya who was on call and she walked me through how to fix alarm.  Expressed my concern as I am unfamiliar with PD.  Patient was not in any distress

## 2015-12-21 DIAGNOSIS — E119 Type 2 diabetes mellitus without complications: Secondary | ICD-10-CM

## 2015-12-21 DIAGNOSIS — Z992 Dependence on renal dialysis: Secondary | ICD-10-CM

## 2015-12-21 DIAGNOSIS — E875 Hyperkalemia: Secondary | ICD-10-CM

## 2015-12-21 DIAGNOSIS — N186 End stage renal disease: Secondary | ICD-10-CM

## 2015-12-21 DIAGNOSIS — M7989 Other specified soft tissue disorders: Secondary | ICD-10-CM

## 2015-12-21 DIAGNOSIS — D638 Anemia in other chronic diseases classified elsewhere: Secondary | ICD-10-CM

## 2015-12-21 LAB — GLUCOSE, CAPILLARY
GLUCOSE-CAPILLARY: 51 mg/dL — AB (ref 65–99)
GLUCOSE-CAPILLARY: 162 mg/dL — AB (ref 65–99)
Glucose-Capillary: 91 mg/dL (ref 65–99)

## 2015-12-21 LAB — HEPATITIS B SURFACE ANTIBODY,QUALITATIVE: HEP B S AB: REACTIVE

## 2015-12-21 LAB — HEPATITIS B CORE ANTIBODY, IGM: Hep B C IgM: NEGATIVE

## 2015-12-21 LAB — HEPATITIS B SURFACE ANTIGEN: HEP B S AG: NEGATIVE

## 2015-12-21 MED ORDER — INSULIN GLARGINE 100 UNIT/ML ~~LOC~~ SOLN
20.0000 [IU] | Freq: Every day | SUBCUTANEOUS | Status: DC
  Filled 2015-12-21: qty 0.2

## 2015-12-21 MED ORDER — ONDANSETRON HCL 4 MG PO TABS: 4.0000 mg | ORAL_TABLET | Freq: Four times a day (QID) | ORAL | Status: DC | PRN

## 2015-12-21 MED ORDER — GENTAMICIN SULFATE 0.1 % EX CREA: 1.0000 "application " | TOPICAL_CREAM | Freq: Every day | CUTANEOUS | Status: DC

## 2015-12-21 MED ORDER — DELFLEX-LC/1.5% DEXTROSE 346 MOSM/L IP SOLN: 1000.0000 mL | INTRAPERITONEAL | Status: DC

## 2015-12-21 MED ORDER — INSULIN GLARGINE 300 UNIT/ML ~~LOC~~ SOPN: 20.0000 [IU] | PEN_INJECTOR | Freq: Every day | SUBCUTANEOUS | Status: DC

## 2015-12-21 NOTE — Progress Notes (Signed)
Patient discharged to home as ordered. Saline lock dc'd site clean dry, PD catheter remain dressing dry and intact. Discharge instructions given as ordered. No acute distress noted

## 2015-12-21 NOTE — Progress Notes (Signed)
TX started 

## 2015-12-21 NOTE — Progress Notes (Signed)
PD complete 

## 2015-12-21 NOTE — Care Management Important Message (Signed)
Important Message  Patient Details  Name: Bethany Lee MRN: [id number] Date of Birth: [birth year]   Medicare Important Message Given:  Yes    Juliann Pulse A [name] 12/21/2015, 12:52 PM

## 2015-12-21 NOTE — Progress Notes (Signed)
[facility]  ROUNDING NOTE   Subjective:   Undergoing emergent peritoneal dialysis. Husband at bedside. No more jerking motions.  Tolerating treatment well.   Objective:  Vital signs in last 24 hours:  Temp:  [98 F (36.7 C)-98.5 F (36.9 C)] 98 F (36.7 C) (05/30 1305) Pulse Rate:  [80-85] 85 (05/30 1305) Resp:  [16-18] 18 (05/30 1305) BP: (130-142)/(68-90) 134/70 mmHg (05/30 1305) SpO2:  [95 %-97 %] 97 % (05/30 1305) Weight:  [70.988 kg (156 lb 8 oz)] 70.988 kg (156 lb 8 oz) (05/30 0533)  Weight change: 3.856 kg (8 lb 8 oz) Filed Weights   12/19/15 1416 12/20/15 0600 12/21/15 0533  Weight: 72.258 kg (159 lb 4.8 oz) 69.31 kg (152 lb 12.8 oz) 70.988 kg (156 lb 8 oz)    Intake/Output: I/O last 3 completed shifts: In: 68 [P.O.:820; Other:60] Out: 2921 [Urine:2600; Other:321]   Intake/Output this shift:  Total I/O In: 240 [P.O.:240] Out: -   Physical Exam: General: NAD, resting in bed  Head: Normocephalic, atraumatic. Moist oral mucosal membranes  Eyes: Anicteric  Neck: Supple, trachea midline  Lungs:  Clear to auscultation normal effort  Heart: Regular rate and rhythm  Abdomen:  Soft, nontender, BS present  Extremities: no peripheral edema.  Neurologic: Nonfocal, moving all four extremities  Skin: No lesions  Access: PD catheter in place, no drainage    Basic Metabolic Panel:  Recent Labs Lab 12/19/15 0716 12/20/15 0501  NA 138 141  K 6.1* 4.6  CL 106 108  CO2 20* 24  GLUCOSE 123* 49*  BUN 94* 86*  CREATININE 8.66* 8.73*  CALCIUM 7.6* 7.6*    Liver Function Tests:  Recent Labs Lab 12/19/15 0716  AST 15  ALT 13*  ALKPHOS 61  BILITOT 0.5  PROT 6.5  ALBUMIN 3.2*   No results for input(s): LIPASE, AMYLASE in the last 168 hours. No results for input(s): AMMONIA in the last 168 hours.  CBC:  Recent Labs Lab 12/19/15 0716  WBC 8.1  NEUTROABS 6.2  HGB 8.6*  HCT 26.1*  MCV 85.4  PLT 210    Cardiac Enzymes:  Recent  Labs Lab 12/19/15 0716 12/19/15 1200  TROPONINI 0.03 <0.03    BNP: Invalid input(s): POCBNP  CBG:  Recent Labs Lab 12/20/15 1620 12/20/15 2029 12/21/15 0729 12/21/15 0808 12/21/15 1156  GLUCAP 118* 161* 77* 26 162*    Microbiology: Results for orders placed or performed during the hospital encounter of 11/18/15  Surgical pcr screen     Status: None   Collection Time: 11/18/15 11:07 AM  Result Value Ref Range Status   MRSA, PCR NEGATIVE NEGATIVE Final   Staphylococcus aureus NEGATIVE NEGATIVE Final    Comment:        The Xpert SA Assay (FDA approved for NASAL specimens in patients over 38 years of age), is one component of a comprehensive surveillance program.  Test performance has been validated by [facility] for patients greater than or equal to 41 year old. It is not intended to diagnose infection nor to guide or monitor treatment.     Coagulation Studies:  Recent Labs  12/19/15 0716  LABPROT 13.0  INR 0.96    Urinalysis:  Recent Labs  12/19/15 1956  COLORURINE STRAW*  LABSPEC 1.009  PHURINE 8.0  GLUCOSEU 150*  HGBUR NEGATIVE  BILIRUBINUR NEGATIVE  KETONESUR NEGATIVE  PROTEINUR >500*  NITRITE NEGATIVE  LEUKOCYTESUR NEGATIVE      Imaging: No results found.   Medications:     .  amLODipine  5 mg Oral Daily  . aspirin EC  81 mg Oral Daily  . dialysis solution 1.5% low-MG/low-CA   Intraperitoneal Q24H  . epoetin (EPOGEN/PROCRIT) injection  20,000 Units Subcutaneous Q14 Days  . furosemide  40 mg Oral BID WC  . gentamicin cream  1 application Topical Daily  . heparin  5,000 Units Subcutaneous Q8H  . insulin aspart  0-9 Units Subcutaneous TID WC  . insulin glargine  20 Units Subcutaneous QHS  . lanthanum  500 mg Oral TID WC  . multivitamin with minerals  1 tablet Oral Daily  . omega-3 acid ethyl esters  1,000 mg Oral BID  . pravastatin  20 mg Oral q1800  . tuberculin  5 Units Intradermal Once   acetaminophen **OR** acetaminophen,  hydrALAZINE, ondansetron **OR** ondansetron (ZOFRAN) IV  Assessment/ Plan:  65 y.o. female with diabetes mellitus type 2, diabetic neuropathy, anemia, hypertension, hyperlipidemia, diabetic nephropathy, ESRD due to DM,  PD catheter placed 11/25/15, anemia of CKD, SHPTH  1. 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 The patient had recent peritoneal dialysis catheter placed. She Presented with myoclonic jerks which is likely secondary to decreased renal clearance of gabapentin. She was clearly in need of initiation of peritoneal dialysis. - Patient has tolerated initiation of peritoneal dialysis quite well. We will continue low volume peritoneal dialysis. She will need placement in our PD clinic in [place] under the care of Dr. Juleen China.  2. Myoclonic jerks. Secondary to decreased renal clearance of gabapentin. Agree with discontinuation of gabapentin. Hopefully tremors and myoclonic jerks should stop once we have started the patient on peritoneal dialysis.  3. Anemia of chronic kidney disease.  - epo as outpatient.   4. Secondary hyperparathyroidism.  - Continue the patient on lanthanum. Unclear who started this.   5. Hypertension.  - amlodipine.    LOS: 2 [name] 5/30/20172:24 PM

## 2015-12-21 NOTE — Progress Notes (Signed)
Patient to be discharged to home after peritoneal dialysis. PD remains in progress at this time

## 2015-12-21 NOTE — Progress Notes (Signed)
Inpatient Diabetes Program Recommendations  AACE/ADA: New Consensus Statement on Inpatient Glycemic Control (2015)  Target Ranges:  Prepandial:   less than 140 mg/dL      Peak postprandial:   less than 180 mg/dL (1-2 hours)      Critically ill patients:  140 - 180 mg/dL  Results for Bethany Lee, Bethany Lee (MRN [id number]) as of 12/21/2015 11:04  Ref. Range 12/20/2015 07:45 12/20/2015 08:17 12/20/2015 11:30 12/20/2015 16:20 12/20/2015 20:29 12/21/2015 07:29 12/21/2015 08:08  Glucose-Capillary Latest Ref Range: 65-99 mg/dL 66 84 88 118 (H) 161 (H) 51 (L) 91  Results for Tellefsen, Bethany Lee (MRN [id number]) as of 12/21/2015 11:04  Ref. Range 12/20/2015 05:01  Glucose Latest Ref Range: 65-99 mg/dL 49 (L)   Review of Glycemic Control  Outpatient Diabetes medications: Toujeo 24 units QHS, Novolog 4-8 units TID with meals Current orders for Inpatient glycemic control: Lantus 24 units QHS, Novolog 0-9 units TID with meals  Inpatient Diabetes Program Recommendations: Insulin - Basal: Fasting glucose 49 mg/dl on 12/20/15 and 51 mg/dl today. Please consider decreasing Lantus to 20 units QHS.  Thanks, Barnie Alderman, RN, MSN, CDE Diabetes Coordinator Inpatient Diabetes Program [phone] (Team Pager from [place] to [place]) [phone] ([facility] office) [phone] [facility] office) [phone] [facility] office)

## 2015-12-21 NOTE — Discharge Summary (Signed)
[place] [facility] [place] NAME: Bethany Lee    MR#:  [id number]  DATE OF BIRTH:  March 01, 1951  DATE OF ADMISSION:  12/19/2015 ADMITTING PHYSICIAN: Fritzi Mandes, MD  DATE OF DISCHARGE: 12/21/2015  5:57 PM  PRIMARY CARE PHYSICIAN: Tracie Harrier, MD     ADMISSION DIAGNOSIS:  Serum potassium elevated [E87.5] Altered mental status, unspecified altered mental status type [R41.82]  DISCHARGE DIAGNOSIS:  Principal Problem:   Myoclonic jerking Active Problems:   Hyperkalemia   ESRD on dialysis [facility])   Peritoneal dialysis status ([place])   Leg swelling   Anemia of chronic disease   Diabetes mellitus ([place])   SECONDARY DIAGNOSIS:   Past Medical History  Diagnosis Date  . Diabetes mellitus without complication ([place])   . Chronic kidney disease   . Hypertension   . Hypercholesterolemia   . Anemia     .pro HOSPITAL COURSE:  Patient is a 65 year old female with past medical history significant with a history of end stage renal disease who has had a peritoneal catheter placed to be initiated on peritoneal dialysis about 1 month ago, diabetes, essential hypertension, anemia of chronic disease who presented to the hospital with complaints of shakiness,  twitching in the upper extremities and body. She was felt to have uremia with  gabapentin toxicity, she was initiated on peritoneal dialysis, and felt better . The patient was seen by nephrologist, underwent 3 peritoneal dialysis sessions and set up for teaching in the  outpatient clinic. She was felt to be stable to be discharged today. Discussion by problem: #1 end-stage renal disease, initiated with peritoneal dialysis, received 3 sessions , was set up with the outpatient dialysis center to receive teaching, appreciate nephrologist input, the patient is to follow up with [facility] as outpatient.  #2 myoclonus, gabapentin toxicity, now off gabapentin, myoclonus has resolved, follow up with  nephrologist to discuss possibility to restart gabapentin as outpatient #3 lower extremity swelling, due to fluid overload,  swelling resolved with PD #4 hypokalemia, resolved was peritoneal dialysis #5. Diabetes mellitus type 2 , blood glucose levels are ranging between 66-160, decrease insulin Lantus dose to 20 units daily, continue short acting inulin before meals #6 anemia chronic disease, status post Epogen injection   DISCHARGE CONDITIONS:   stable  CONSULTS OBTAINED:  Treatment Team:  Anthonette Legato, MD  DRUG ALLERGIES:   Allergies  Allergen Reactions  . Penicillins     DISCHARGE MEDICATIONS:   Discharge Medication List as of 12/21/2015  4:57 PM    START taking these medications   Details  gentamicin cream (GARAMYCIN) 0.1 % Apply 1 application topically daily., Starting 12/21/2015, Until Discontinued, Normal    ondansetron (ZOFRAN) 4 MG tablet Take 1 tablet (4 mg total) by mouth every 6 (six) hours as needed for nausea., Starting 12/21/2015, Until Discontinued, Normal    Peritoneal Dialysis Solutions (DIALYSIS SOLUTION 1.5% LOW-MG/LOW-CA) dianeal solution Inject 1,000 mLs into the peritoneum daily., Starting 12/21/2015, Until Discontinued, Normal      CONTINUE these medications which have CHANGED   Details  Insulin Glargine (TOUJEO SOLOSTAR) 300 UNIT/ML SOPN Inject 20 Units into the skin at bedtime., Starting 12/21/2015, Until Discontinued, Normal      CONTINUE these medications which have NOT CHANGED   Details  amLODipine (NORVASC) 5 MG tablet Take 5 mg by mouth daily., Until Discontinued, Historical Med    aspirin EC 81 MG tablet Take 1 tablet (81 mg total) by mouth daily. Start taking after NOV 15,  Starting 05/26/2015, Until Discontinued, Normal    calcium acetate (PHOSLO) 667 MG capsule Take 1,334 mg by mouth 3 (three) times daily with meals., Until Discontinued, Historical Med    furosemide (LASIX) 40 MG tablet Take 1 tablet (40 mg total) by mouth 2 (two) times  daily with breakfast and lunch. Take as needed for Leg swelling, Starting 05/26/2015, Until Discontinued, Normal    insulin aspart (NOVOLOG FLEXPEN) 100 UNIT/ML FlexPen 4-8 Units 3 (three) times daily with meals. , Starting 09/13/2015, Until Discontinued, Historical Med    Lanthanum Carbonate (FOSRENOL PO) Take 1 tablet by mouth 3 (three) times daily with meals., Until Discontinued, Historical Med    lovastatin (MEVACOR) 20 MG tablet Take 20 mg by mouth at bedtime., Until Discontinued, Historical Med    Multiple Vitamins-Minerals (MULTIVITAMIN WITH MINERALS) tablet Take 1 tablet by mouth daily., Until Discontinued, Historical Med    omega-3 acid ethyl esters (LOVAZA) 1 G capsule Take 1,200 mg by mouth 2 (two) times daily., Until Discontinued, Historical Med    sodium bicarbonate 650 MG tablet Take 1,300 mg by mouth 2 (two) times daily., Until Discontinued, Historical Med    VITAMIN D, ERGOCALCIFEROL, PO Take 1 tablet by mouth daily., Until Discontinued, Historical Med      STOP taking these medications     gabapentin (NEURONTIN) 300 MG capsule          DISCHARGE INSTRUCTIONS:    follow up with PCP, nephrology  If you experience worsening of your admission symptoms, develop shortness of breath, life threatening emergency, suicidal or homicidal thoughts you must seek medical attention immediately by calling 911 or calling your MD immediately  if symptoms less severe.  You Must read complete instructions/literature along with all the possible adverse reactions/side effects for all the Medicines you take and that have been prescribed to you. Take any new Medicines after you have completely understood and accept all the possible adverse reactions/side effects.   Please note  You were cared for by a hospitalist during your hospital stay. If you have any questions about your discharge medications or the care you received while you were in the hospital after you are discharged, you can call  the unit and asked to speak with the hospitalist on call if the hospitalist that took care of you is not available. Once you are discharged, your primary care physician will handle any further medical issues. Please note that NO REFILLS for any discharge medications will be authorized once you are discharged, as it is imperative that you return to your primary care physician (or establish a relationship with a primary care physician if you do not have one) for your aftercare needs so that they can reassess your need for medications and monitor your lab values.    Today   CHIEF COMPLAINT:   Chief Complaint  Patient presents with  . Shaking  . Aphasia    HISTORY OF PRESENT ILLNESS:  Bethany Lee  is a 65 y.o. female with a known history of end stage renal disease who has had a peritoneal catheter placed to be initiated on peritoneal dialysis about 1 month ago, diabetes, essential hypertension, anemia of chronic disease who presented to the hospital with complaints of shakiness,  twitching in the upper extremities and body. She was felt to have uremia with  gabapentin toxicity, she was initiated on peritoneal dialysis, and felt better . The patient was seen by nephrologist, underwent 3 peritoneal dialysis sessions and set up for teaching in the  outpatient clinic. She was felt to be stable to be discharged today. Discussion by problem: #1 end-stage renal disease, initiated with peritoneal dialysis, received 3 sessions , was set up with the outpatient dialysis center to receive teaching, appreciate nephrologist input, the patient is to follow up with DR [facility] as outpatient.  #2 myoclonus, gabapentin toxicity, now off gabapentin, myoclonus has resolved, follow up with nephrologist to discuss possibility to restart gabapentin as outpatient #3 lower extremity swelling, due to fluid overload,  swelling resolved with PD #4 hypokalemia, resolved was peritoneal dialysis #5. Diabetes mellitus type 2 , blood  glucose levels are ranging between 66-160, decrease insulin Lantus dose to 20 units daily, continue short acting inulin before meals #6 anemia chronic disease, status post Epogen injection    VITAL SIGNS:  Blood pressure 134/70, pulse 85, temperature 98 F (36.7 C), temperature source Oral, resp. rate 18, height 5' (1.524 m), weight 70.988 kg (156 lb 8 oz), SpO2 97 %.  I/O:   Intake/Output Summary (Last 24 hours) at 12/21/15 2030 Last data filed at 12/21/15 1722  Gross per 24 hour  Intake    240 ml  Output    228 ml  Net     12 ml    PHYSICAL EXAMINATION:  GENERAL:  65 y.o.-year-old patient lying in the bed with no acute distress.  EYES: Pupils equal, round, reactive to light and accommodation. No scleral icterus. Extraocular muscles intact.  HEENT: Head atraumatic, normocephalic. Oropharynx and nasopharynx clear.  NECK:  Supple, no jugular venous distention. No thyroid enlargement, no tenderness.  LUNGS: Normal breath sounds bilaterally, no wheezing, rales,rhonchi or crepitation. No use of accessory muscles of respiration.  CARDIOVASCULAR: S1, S2 normal. No murmurs, rubs, or gallops.  ABDOMEN: Soft, non-tender, non-distended. Bowel sounds present. No organomegaly or mass.  EXTREMITIES: No pedal edema, cyanosis, or clubbing.  NEUROLOGIC: Cranial nerves II through XII are intact. Muscle strength 5/5 in all extremities. Sensation intact. Gait not checked.  PSYCHIATRIC: The patient is alert and oriented x 3.  SKIN: No obvious rash, lesion, or ulcer.   DATA REVIEW:   CBC  Recent Labs Lab 12/19/15 0716  WBC 8.1  HGB 8.6*  HCT 26.1*  PLT 210    Chemistries   Recent Labs Lab 12/19/15 0716 12/20/15 0501  NA 138 141  K 6.1* 4.6  CL 106 108  CO2 20* 24  GLUCOSE 123* 49*  BUN 94* 86*  CREATININE 8.66* 8.73*  CALCIUM 7.6* 7.6*  AST 15  --   ALT 13*  --   ALKPHOS 61  --   BILITOT 0.5  --     Cardiac Enzymes  Recent Labs Lab 12/19/15 1200  TROPONINI <0.03     Microbiology Results  Results for orders placed or performed during the hospital encounter of 11/18/15  Surgical pcr screen     Status: None   Collection Time: 11/18/15 11:07 AM  Result Value Ref Range Status   MRSA, PCR NEGATIVE NEGATIVE Final   Staphylococcus aureus NEGATIVE NEGATIVE Final    Comment:        The Xpert SA Assay (FDA approved for NASAL specimens in patients over 81 years of age), is one component of a comprehensive surveillance program.  Test performance has been validated by [facility] for patients greater than or equal to 7 year old. It is not intended to diagnose infection nor to guide or monitor treatment.     RADIOLOGY:  No results found.  EKG:   Orders placed  or performed during the hospital encounter of 12/19/15  . EKG 12-Lead  . EKG 12-Lead  . ED EKG  . ED EKG  . EKG 12-Lead  . EKG 12-Lead      Management plans discussed with the patient, family and they are in agreement.  CODE STATUS:     Code Status Orders        Start     Ordered   12/19/15 1416  Full code   Continuous     12/19/15 1415    Code Status History    Date Active Date Inactive Code Status Order ID Comments User Context   This patient has a current code status but no historical code status.      TOTAL TIME TAKING CARE OF THIS PATIENT: 40 minutes.    Theodoro Grist M.D on 12/21/2015 at 8:30 PM  Between 7am to 6pm - Pager - [phone]  After 6pm go to [email or web address] - password [email or web address] [facility]  Office  [phone]  CC: Primary care physician; Tracie Harrier, MD

## 2015-12-21 NOTE — Care Management (Signed)
Patient for discharge home.  She and her husband verbalize understanding of appointment at [facility] tomorrow at [facility]

## 2015-12-21 NOTE — Evaluation (Signed)
Physical Therapy Evaluation Patient Details Name: Bethany Lee MRN: [id number] DOB: [birth year] Today's Date: 12/21/2015   History of Present Illness  65 yo F presented to ED on 5/28 for shaking, difficulty speaking and anxiety found to have myoclonic jerking of UEs, suspected cause from side effects of medication. PMH includes ESRD with recent peritoneal catheter placement for HD, diabetic glomerulosclerosis, anemia.  Clinical Impression  Pt is independent with all mobility. She ambulated 275 ft without AD. Pt navigated steps with one rail independently. She is at her baseline LOF and has no notable deficits. Pt has no needs for skilled PT services at this time. Current orders will be completed and new order will be required if a need arises in the future.     Follow Up Recommendations No PT follow up    Equipment Recommendations  None recommended by PT    Recommendations for Other Services       Precautions / Restrictions Precautions Precautions: None Restrictions Weight Bearing Restrictions: No      Mobility  Bed Mobility Overal bed mobility: Independent                Transfers Overall transfer level: Independent Equipment used: None             General transfer comment: steady with no LOB  Ambulation/Gait Ambulation/Gait assistance: Independent Ambulation Distance (Feet): 275 Feet Assistive device: None Gait Pattern/deviations: Narrow base of support Gait velocity: WFL Gait velocity interpretation: at or above normal speed for age/gender General Gait Details: Pt with good speed and stability. No difficulties.  Stairs Stairs: Yes Stairs assistance: Independent Stair Management: One rail Right Number of Stairs: 6 General stair comments: steady, no difficulties  Wheelchair Mobility    Modified Rankin (Stroke Patients Only)       Balance Overall balance assessment: No apparent balance deficits (not formally assessed)                                            Pertinent Vitals/Pain Pain Assessment: No/denies pain    Home Living Family/patient expects to be discharged to:: Private residence Living Arrangements: Spouse/significant other Available Help at Discharge: Family Type of Home: House Home Access: Stairs to enter Entrance Stairs-Rails: Can reach both Entrance Stairs-Number of Steps: 5 to 15 Home Layout: Laundry or work area in basement;One level Home Equipment: None      Prior Function Level of Independence: Independent         Comments: Pt I with ADLs and ambulation with no AD     Hand Dominance        Extremity/Trunk Assessment   Upper Extremity Assessment: Overall WFL for tasks assessed           Lower Extremity Assessment: Overall WFL for tasks assessed         Communication   Communication: No difficulties  Cognition Arousal/Alertness: Awake/alert Behavior During Therapy: WFL for tasks assessed/performed Overall Cognitive Status: Within Functional Limits for tasks assessed                      General Comments General comments (skin integrity, edema, etc.): peritoneal catherter for HD    Exercises        Assessment/Plan    PT Assessment Patent does not need any further PT services  PT Diagnosis     PT Problem List  PT Treatment Interventions     PT Goals (Current goals can be found in the Care Plan section) Acute Rehab PT Goals Patient Stated Goal: to go home today PT Goal Formulation: With patient/family Potential to Achieve Goals: Good    Frequency     Barriers to discharge        Co-evaluation               End of Session Equipment Utilized During Treatment: Gait belt Activity Tolerance: Patient tolerated treatment well Patient left: in chair;with call bell/phone within reach;with family/visitor present Nurse Communication: Mobility status         Time: [id number] PT Time Calculation (min) (ACUTE ONLY): 15  min   Charges:   PT Evaluation $PT Eval Low Complexity: 1 Procedure     PT G Codes:        Neoma Laming, PT, DPT  12/21/2015, 12:29 PM [phone]

## 2015-12-22 DIAGNOSIS — N186 End stage renal disease: Secondary | ICD-10-CM | POA: Diagnosis not present

## 2015-12-22 DIAGNOSIS — Z992 Dependence on renal dialysis: Secondary | ICD-10-CM | POA: Diagnosis not present

## 2015-12-23 DIAGNOSIS — E119 Type 2 diabetes mellitus without complications: Secondary | ICD-10-CM | POA: Diagnosis not present

## 2015-12-23 DIAGNOSIS — Z794 Long term (current) use of insulin: Secondary | ICD-10-CM | POA: Diagnosis not present

## 2015-12-23 DIAGNOSIS — Z992 Dependence on renal dialysis: Secondary | ICD-10-CM | POA: Diagnosis not present

## 2015-12-23 DIAGNOSIS — N186 End stage renal disease: Secondary | ICD-10-CM | POA: Diagnosis not present

## 2015-12-23 DIAGNOSIS — E785 Hyperlipidemia, unspecified: Secondary | ICD-10-CM | POA: Diagnosis not present

## 2015-12-23 DIAGNOSIS — Z1159 Encounter for screening for other viral diseases: Secondary | ICD-10-CM | POA: Diagnosis not present

## 2015-12-23 DIAGNOSIS — N2581 Secondary hyperparathyroidism of renal origin: Secondary | ICD-10-CM | POA: Diagnosis not present

## 2015-12-27 ENCOUNTER — Inpatient Hospital Stay: Payer: Commercial Managed Care - HMO | Attending: Oncology

## 2015-12-27 ENCOUNTER — Inpatient Hospital Stay: Payer: Commercial Managed Care - HMO

## 2015-12-27 ENCOUNTER — Other Ambulatory Visit: Payer: Self-pay

## 2015-12-27 ENCOUNTER — Ambulatory Visit: Payer: Self-pay

## 2015-12-27 VITALS — BP 182/97 | HR 84 | Temp 97.0°F | Resp 18

## 2015-12-27 DIAGNOSIS — Z794 Long term (current) use of insulin: Secondary | ICD-10-CM | POA: Diagnosis not present

## 2015-12-27 DIAGNOSIS — N186 End stage renal disease: Secondary | ICD-10-CM | POA: Diagnosis not present

## 2015-12-27 DIAGNOSIS — D631 Anemia in chronic kidney disease: Secondary | ICD-10-CM | POA: Insufficient documentation

## 2015-12-27 DIAGNOSIS — Z992 Dependence on renal dialysis: Secondary | ICD-10-CM | POA: Diagnosis not present

## 2015-12-27 DIAGNOSIS — I129 Hypertensive chronic kidney disease with stage 1 through stage 4 chronic kidney disease, or unspecified chronic kidney disease: Secondary | ICD-10-CM | POA: Diagnosis not present

## 2015-12-27 DIAGNOSIS — N189 Chronic kidney disease, unspecified: Secondary | ICD-10-CM | POA: Diagnosis not present

## 2015-12-27 DIAGNOSIS — E1122 Type 2 diabetes mellitus with diabetic chronic kidney disease: Secondary | ICD-10-CM | POA: Diagnosis not present

## 2015-12-27 LAB — HEMOGLOBIN: HEMOGLOBIN: 9.3 g/dL — AB (ref 12.0–16.0)

## 2015-12-27 MED ORDER — EPOETIN ALFA 40000 UNIT/ML IJ SOLN: 40000.0000 [IU] | Freq: Once | INTRAMUSCULAR | Status: DC

## 2015-12-28 ENCOUNTER — Ambulatory Visit
Admission: RE | Admit: 2015-12-28 | Discharge: 2015-12-28 | Disposition: A | Discharge status: Home / Self Care | Payer: Commercial Managed Care - HMO | Source: Ambulatory Visit | Attending: Vascular Surgery | Admitting: Vascular Surgery

## 2015-12-28 ENCOUNTER — Telehealth: Payer: Self-pay | Admitting: *Deleted

## 2015-12-28 ENCOUNTER — Other Ambulatory Visit: Payer: Self-pay | Admitting: Vascular Surgery

## 2015-12-28 DIAGNOSIS — Y841 Kidney dialysis as the cause of abnormal reaction of the patient, or of later complication, without mention of misadventure at the time of the procedure: Secondary | ICD-10-CM | POA: Diagnosis not present

## 2015-12-28 DIAGNOSIS — Z992 Dependence on renal dialysis: Secondary | ICD-10-CM | POA: Insufficient documentation

## 2015-12-28 DIAGNOSIS — E785 Hyperlipidemia, unspecified: Secondary | ICD-10-CM | POA: Diagnosis not present

## 2015-12-28 DIAGNOSIS — Z452 Encounter for adjustment and management of vascular access device: Secondary | ICD-10-CM | POA: Diagnosis not present

## 2015-12-28 DIAGNOSIS — Z794 Long term (current) use of insulin: Secondary | ICD-10-CM | POA: Diagnosis not present

## 2015-12-28 DIAGNOSIS — Z95828 Presence of other vascular implants and grafts: Secondary | ICD-10-CM | POA: Insufficient documentation

## 2015-12-28 DIAGNOSIS — I10 Essential (primary) hypertension: Secondary | ICD-10-CM | POA: Diagnosis not present

## 2015-12-28 DIAGNOSIS — M795 Residual foreign body in soft tissue: Secondary | ICD-10-CM

## 2015-12-28 DIAGNOSIS — N186 End stage renal disease: Secondary | ICD-10-CM | POA: Diagnosis not present

## 2015-12-28 DIAGNOSIS — E1121 Type 2 diabetes mellitus with diabetic nephropathy: Secondary | ICD-10-CM | POA: Diagnosis not present

## 2015-12-28 DIAGNOSIS — E1122 Type 2 diabetes mellitus with diabetic chronic kidney disease: Secondary | ICD-10-CM | POA: Diagnosis not present

## 2015-12-28 DIAGNOSIS — E119 Type 2 diabetes mellitus without complications: Secondary | ICD-10-CM | POA: Diagnosis not present

## 2015-12-28 DIAGNOSIS — G63 Polyneuropathy in diseases classified elsewhere: Secondary | ICD-10-CM | POA: Diagnosis not present

## 2015-12-28 DIAGNOSIS — N185 Chronic kidney disease, stage 5: Secondary | ICD-10-CM | POA: Diagnosis not present

## 2015-12-28 NOTE — Telephone Encounter (Signed)
States she is not on dialysis yet, they are in training and her catheter is not working she needs to have it repositioned. Informed that she does not need epo inj until next

## 2015-12-28 NOTE — Telephone Encounter (Signed)
Asking when she is to come back for inj she was unable to get yesterday due to HTN. Does she have to wait until 7/13 or will we recheck her and give it before then "she needs it"

## 2015-12-28 NOTE — Telephone Encounter (Signed)
Keep current appt as scheduled.  Also need to determine if she even needs to come here anymore now that she is on dialysis.

## 2015-12-30 ENCOUNTER — Ambulatory Visit: Payer: Self-pay

## 2015-12-30 ENCOUNTER — Other Ambulatory Visit: Payer: Self-pay

## 2015-12-31 ENCOUNTER — Other Ambulatory Visit: Payer: Self-pay | Admitting: Vascular Surgery

## 2016-01-03 ENCOUNTER — Ambulatory Visit
Admission: RE | Admit: 2016-01-03 | Discharge: 2016-01-03 | Disposition: A | Discharge status: Home / Self Care | Payer: Commercial Managed Care - HMO | Source: Ambulatory Visit | Attending: Vascular Surgery | Admitting: Vascular Surgery

## 2016-01-03 DIAGNOSIS — I120 Hypertensive chronic kidney disease with stage 5 chronic kidney disease or end stage renal disease: Secondary | ICD-10-CM | POA: Insufficient documentation

## 2016-01-03 DIAGNOSIS — I10 Essential (primary) hypertension: Secondary | ICD-10-CM | POA: Diagnosis not present

## 2016-01-03 DIAGNOSIS — Z992 Dependence on renal dialysis: Secondary | ICD-10-CM | POA: Insufficient documentation

## 2016-01-03 DIAGNOSIS — N186 End stage renal disease: Secondary | ICD-10-CM | POA: Insufficient documentation

## 2016-01-03 DIAGNOSIS — Z01812 Encounter for preprocedural laboratory examination: Secondary | ICD-10-CM | POA: Diagnosis not present

## 2016-01-03 DIAGNOSIS — E1122 Type 2 diabetes mellitus with diabetic chronic kidney disease: Secondary | ICD-10-CM | POA: Insufficient documentation

## 2016-01-03 DIAGNOSIS — E875 Hyperkalemia: Secondary | ICD-10-CM | POA: Diagnosis not present

## 2016-01-03 DIAGNOSIS — E782 Mixed hyperlipidemia: Secondary | ICD-10-CM | POA: Diagnosis not present

## 2016-01-03 DIAGNOSIS — D638 Anemia in other chronic diseases classified elsewhere: Secondary | ICD-10-CM | POA: Diagnosis not present

## 2016-01-03 DIAGNOSIS — Z794 Long term (current) use of insulin: Secondary | ICD-10-CM | POA: Insufficient documentation

## 2016-01-03 DIAGNOSIS — E7800 Pure hypercholesterolemia, unspecified: Secondary | ICD-10-CM | POA: Diagnosis not present

## 2016-01-03 DIAGNOSIS — E1121 Type 2 diabetes mellitus with diabetic nephropathy: Secondary | ICD-10-CM | POA: Diagnosis not present

## 2016-01-03 DIAGNOSIS — E039 Hypothyroidism, unspecified: Secondary | ICD-10-CM | POA: Diagnosis not present

## 2016-01-03 DIAGNOSIS — N185 Chronic kidney disease, stage 5: Secondary | ICD-10-CM | POA: Diagnosis not present

## 2016-01-03 LAB — POTASSIUM: Potassium: 5.2 mmol/L — ABNORMAL HIGH (ref 3.5–5.1)

## 2016-01-03 LAB — TYPE AND SCREEN
ABO/RH(D): A POS
ANTIBODY SCREEN: NEGATIVE

## 2016-01-03 LAB — SURGICAL PCR SCREEN
MRSA, PCR: NEGATIVE
STAPHYLOCOCCUS AUREUS: NEGATIVE

## 2016-01-03 NOTE — Patient Instructions (Signed)
Your procedure is scheduled on: Thursday 01/06/16 Report to Day Surgery. 2ND FLOOR MEDICAL MALL ENTRANCE To find out your arrival time please call [phone] between 1PM - 3PM on Wednesday 01/05/16.  Remember: Instructions that are not followed completely may result in serious medical risk, up to and including death, or upon the discretion of your surgeon and anesthesiologist your surgery may need to be rescheduled.    __X__ 1. Do not eat food or drink liquids after midnight. No gum chewing or hard candies.     __X__ 2. No Alcohol for 24 hours before or after surgery.   ____ 3. Bring all medications with you on the day of surgery if instructed.    __X__ 4. Notify your doctor if there is any change in your medical condition     (cold, fever, infections).     Do not wear jewelry, make-up, hairpins, clips or nail polish.  Do not wear lotions, powders, or perfumes.   Do not shave 48 hours prior to surgery. Men may shave face and neck.  Do not bring valuables to the hospital.    [facility] is not responsible for any belongings or valuables.               Contacts, dentures or bridgework may not be worn into surgery.  Leave your suitcase in the car. After surgery it may be brought to your room.  For patients admitted to the hospital, discharge time is determined by your                treatment team.   Patients discharged the day of surgery will not be allowed to drive home.   Please read over the following fact sheets that you were given:   MRSA Information and Surgical Site Infection Prevention   __X TAKE THESE MEDS WITH A SIP OF WATER THE MORNING OF SURGERY   1. AMLODIPINE  2. GABAPENTIN  3. LEVOTHYROXINE  4.  5.  6.  ____ Fleet Enema (as directed)   __X__ Use CHG Soap as directed USE SAGE WIPES  ____ Use inhalers on the day of surgery  ____ Stop metformin 2 days prior to surgery    __X__ Take 1/2 of usual insulin dose the night before surgery and none on the morning of  surgery.   ____ Stop Coumadin/Plavix/aspirin on   ____ Stop Anti-inflammatories on    ____ Stop supplements until after surgery.    ____ Bring C-Pap to the hospital.

## 2016-01-04 ENCOUNTER — Ambulatory Visit: Payer: Self-pay | Admitting: Oncology

## 2016-01-04 ENCOUNTER — Ambulatory Visit: Payer: Self-pay

## 2016-01-04 ENCOUNTER — Other Ambulatory Visit: Payer: Self-pay

## 2016-01-05 ENCOUNTER — Other Ambulatory Visit: Payer: Self-pay | Admitting: Nephrology

## 2016-01-05 ENCOUNTER — Ambulatory Visit
Admission: RE | Admit: 2016-01-05 | Discharge: 2016-01-05 | Disposition: A | Discharge status: Home / Self Care | Payer: Commercial Managed Care - HMO | Source: Ambulatory Visit | Attending: Nephrology | Admitting: Nephrology

## 2016-01-05 DIAGNOSIS — Z4902 Encounter for fitting and adjustment of peritoneal dialysis catheter: Secondary | ICD-10-CM

## 2016-01-05 DIAGNOSIS — Z452 Encounter for adjustment and management of vascular access device: Secondary | ICD-10-CM | POA: Diagnosis not present

## 2016-01-06 ENCOUNTER — Ambulatory Visit: Payer: Commercial Managed Care - HMO | Admitting: Anesthesiology

## 2016-01-06 ENCOUNTER — Encounter
Admission: RE | Disposition: A | Discharge status: Home / Self Care | Payer: Self-pay | Source: Ambulatory Visit | Attending: Vascular Surgery

## 2016-01-06 ENCOUNTER — Ambulatory Visit
Admission: RE | Admit: 2016-01-06 | Discharge: 2016-01-06 | Disposition: A | Discharge status: Home / Self Care | Payer: Commercial Managed Care - HMO | Source: Ambulatory Visit | Attending: Vascular Surgery | Admitting: Vascular Surgery

## 2016-01-06 DIAGNOSIS — I120 Hypertensive chronic kidney disease with stage 5 chronic kidney disease or end stage renal disease: Secondary | ICD-10-CM | POA: Diagnosis not present

## 2016-01-06 DIAGNOSIS — Z87891 Personal history of nicotine dependence: Secondary | ICD-10-CM | POA: Insufficient documentation

## 2016-01-06 DIAGNOSIS — Y838 Other surgical procedures as the cause of abnormal reaction of the patient, or of later complication, without mention of misadventure at the time of the procedure: Secondary | ICD-10-CM | POA: Diagnosis not present

## 2016-01-06 DIAGNOSIS — E119 Type 2 diabetes mellitus without complications: Secondary | ICD-10-CM | POA: Diagnosis not present

## 2016-01-06 DIAGNOSIS — Z992 Dependence on renal dialysis: Secondary | ICD-10-CM | POA: Insufficient documentation

## 2016-01-06 DIAGNOSIS — T8249XA Other complication of vascular dialysis catheter, initial encounter: Secondary | ICD-10-CM | POA: Diagnosis not present

## 2016-01-06 DIAGNOSIS — Z452 Encounter for adjustment and management of vascular access device: Secondary | ICD-10-CM | POA: Diagnosis not present

## 2016-01-06 DIAGNOSIS — N186 End stage renal disease: Secondary | ICD-10-CM | POA: Diagnosis not present

## 2016-01-06 DIAGNOSIS — E785 Hyperlipidemia, unspecified: Secondary | ICD-10-CM | POA: Diagnosis not present

## 2016-01-06 DIAGNOSIS — D649 Anemia, unspecified: Secondary | ICD-10-CM | POA: Diagnosis not present

## 2016-01-06 DIAGNOSIS — I10 Essential (primary) hypertension: Secondary | ICD-10-CM | POA: Diagnosis not present

## 2016-01-06 DIAGNOSIS — N185 Chronic kidney disease, stage 5: Secondary | ICD-10-CM | POA: Diagnosis not present

## 2016-01-06 DIAGNOSIS — E1122 Type 2 diabetes mellitus with diabetic chronic kidney disease: Secondary | ICD-10-CM | POA: Insufficient documentation

## 2016-01-06 DIAGNOSIS — Z794 Long term (current) use of insulin: Secondary | ICD-10-CM | POA: Insufficient documentation

## 2016-01-06 DIAGNOSIS — Y841 Kidney dialysis as the cause of abnormal reaction of the patient, or of later complication, without mention of misadventure at the time of the procedure: Secondary | ICD-10-CM | POA: Diagnosis not present

## 2016-01-06 DIAGNOSIS — T82858A Stenosis of vascular prosthetic devices, implants and grafts, initial encounter: Secondary | ICD-10-CM | POA: Diagnosis not present

## 2016-01-06 HISTORY — PX: CAPD INSERTION: SHX5233

## 2016-01-06 LAB — GLUCOSE, CAPILLARY
GLUCOSE-CAPILLARY: 123 mg/dL — AB (ref 65–99)
Glucose-Capillary: 119 mg/dL — ABNORMAL HIGH (ref 65–99)

## 2016-01-06 LAB — I-STAT 4 (NA, K, GLUC, HGB, HCT): Potassium: 4.8 mmol/L

## 2016-01-06 SURGERY — LAPAROSCOPIC INSERTION CONTINUOUS AMBULATORY PERITONEAL DIALYSIS  (CAPD) CATHETER
Anesthesia: General | Wound class: Clean Contaminated

## 2016-01-06 MED ORDER — ONDANSETRON HCL 4 MG/2ML IJ SOLN
4.0000 mg | Freq: Once | INTRAMUSCULAR | Status: AC
  Administered 2016-01-06: 4 mg via INTRAVENOUS

## 2016-01-06 MED ORDER — ONDANSETRON HCL 4 MG/2ML IJ SOLN
INTRAMUSCULAR | Status: AC
  Administered 2016-01-06: 4 mg via INTRAVENOUS
  Filled 2016-01-06: qty 2

## 2016-01-06 MED ORDER — ONDANSETRON HCL 4 MG/2ML IJ SOLN: 4.0000 mg | Freq: Once | INTRAMUSCULAR | Status: DC

## 2016-01-06 MED ORDER — SUGAMMADEX SODIUM 200 MG/2ML IV SOLN
INTRAVENOUS | Status: DC | PRN
  Administered 2016-01-06: 200 mg via INTRAVENOUS

## 2016-01-06 MED ORDER — SODIUM CHLORIDE 0.9 % IV SOLN
INTRAVENOUS | Status: DC
  Administered 2016-01-06: 14:00:00 via INTRAVENOUS

## 2016-01-06 MED ORDER — EPHEDRINE SULFATE 50 MG/ML IJ SOLN
INTRAMUSCULAR | Status: DC | PRN
  Administered 2016-01-06: 5 mg via INTRAVENOUS

## 2016-01-06 MED ORDER — FAMOTIDINE 20 MG PO TABS
ORAL_TABLET | ORAL | Status: AC
  Administered 2016-01-06: 20 mg via ORAL
  Filled 2016-01-06: qty 1

## 2016-01-06 MED ORDER — HYDROCODONE-ACETAMINOPHEN 5-325 MG PO TABS: 1.0000 | ORAL_TABLET | Freq: Four times a day (QID) | ORAL | Status: DC | PRN

## 2016-01-06 MED ORDER — CLINDAMYCIN PHOSPHATE 300 MG/50ML IV SOLN
INTRAVENOUS | Status: AC
  Administered 2016-01-06: 300 mg via INTRAVENOUS
  Filled 2016-01-06: qty 50

## 2016-01-06 MED ORDER — HYDROCODONE-ACETAMINOPHEN 5-325 MG PO TABS
ORAL_TABLET | ORAL | Status: AC
  Filled 2016-01-06: qty 1

## 2016-01-06 MED ORDER — ROCURONIUM BROMIDE 100 MG/10ML IV SOLN
INTRAVENOUS | Status: DC | PRN
  Administered 2016-01-06: 30 mg via INTRAVENOUS

## 2016-01-06 MED ORDER — FENTANYL CITRATE (PF) 100 MCG/2ML IJ SOLN
25.0000 ug | INTRAMUSCULAR | Status: DC | PRN
  Administered 2016-01-06 (×4): 25 ug via INTRAVENOUS

## 2016-01-06 MED ORDER — CLINDAMYCIN PHOSPHATE 300 MG/50ML IV SOLN
300.0000 mg | Freq: Once | INTRAVENOUS | Status: AC
  Administered 2016-01-06: 300 mg via INTRAVENOUS

## 2016-01-06 MED ORDER — FENTANYL CITRATE (PF) 100 MCG/2ML IJ SOLN
INTRAMUSCULAR | Status: AC
  Administered 2016-01-06: 25 ug via INTRAVENOUS
  Filled 2016-01-06: qty 2

## 2016-01-06 MED ORDER — DEXAMETHASONE SODIUM PHOSPHATE 4 MG/ML IJ SOLN
INTRAMUSCULAR | Status: DC | PRN
  Administered 2016-01-06: 5 mg via INTRAVENOUS

## 2016-01-06 MED ORDER — LIDOCAINE HCL (CARDIAC) 20 MG/ML IV SOLN
INTRAVENOUS | Status: DC | PRN
  Administered 2016-01-06: 100 mg via INTRAVENOUS

## 2016-01-06 MED ORDER — FENTANYL CITRATE (PF) 100 MCG/2ML IJ SOLN
INTRAMUSCULAR | Status: DC | PRN
Start: 2016-01-06 — End: 2016-01-06
  Administered 2016-01-06: 50 ug via INTRAVENOUS
  Administered 2016-01-06: 150 ug via INTRAVENOUS

## 2016-01-06 MED ORDER — PROPOFOL 10 MG/ML IV BOLUS
INTRAVENOUS | Status: DC | PRN
  Administered 2016-01-06: 150 mg via INTRAVENOUS

## 2016-01-06 MED ORDER — CHLORHEXIDINE GLUCONATE 4 % EX LIQD: 60.0000 mL | Freq: Once | CUTANEOUS | Status: DC

## 2016-01-06 MED ORDER — ONDANSETRON HCL 4 MG/2ML IJ SOLN
4.0000 mg | Freq: Once | INTRAMUSCULAR | Status: AC | PRN
  Administered 2016-01-06: 4 mg via INTRAVENOUS

## 2016-01-06 MED ORDER — ONDANSETRON HCL 4 MG/2ML IJ SOLN
INTRAMUSCULAR | Status: DC | PRN
  Administered 2016-01-06: 4 mg via INTRAVENOUS

## 2016-01-06 MED ORDER — FAMOTIDINE 20 MG PO TABS
20.0000 mg | ORAL_TABLET | Freq: Once | ORAL | Status: AC
  Administered 2016-01-06: 20 mg via ORAL

## 2016-01-06 MED ORDER — HYDROCODONE-ACETAMINOPHEN 5-325 MG PO TABS
1.0000 | ORAL_TABLET | Freq: Four times a day (QID) | ORAL | Status: DC | PRN
  Administered 2016-01-06: 1 via ORAL

## 2016-01-06 SURGICAL SUPPLY — 31 items
ADAPTER BETA CAP QUINTON DIALY (ADAPTER) ×3 IMPLANT
ADAPTER CATH DIALYSIS 18.75 (CATHETERS) IMPLANT
ADAPTER CATH DIALYSIS 18.75CM (CATHETERS)
CANISTER SUCT 1200ML W/VALVE (MISCELLANEOUS) ×3 IMPLANT
CATH DLYS SWAN NECK 62.5CM (CATHETERS) IMPLANT
CHLORAPREP W/TINT 26ML (MISCELLANEOUS) ×3 IMPLANT
ELECT CAUTERY BLADE 6.4 (BLADE) ×3 IMPLANT
ELECT REM PT RETURN 9FT ADLT (ELECTROSURGICAL) ×3
ELECTRODE REM PT RTRN 9FT ADLT (ELECTROSURGICAL) ×1 IMPLANT
GLOVE BIO SURGEON STRL SZ7 (GLOVE) ×6 IMPLANT
GLOVE INDICATOR 7.5 STRL GRN (GLOVE) ×3 IMPLANT
GOWN STRL REUS W/ TWL LRG LVL3 (GOWN DISPOSABLE) ×2 IMPLANT
GOWN STRL REUS W/ TWL XL LVL3 (GOWN DISPOSABLE) ×1 IMPLANT
GOWN STRL REUS W/TWL LRG LVL3 (GOWN DISPOSABLE) ×4
GOWN STRL REUS W/TWL XL LVL3 (GOWN DISPOSABLE) ×2
IV NS 500ML (IV SOLUTION) ×2
IV NS 500ML BAXH (IV SOLUTION) ×1 IMPLANT
KIT RM TURNOVER STRD PROC AR (KITS) ×3 IMPLANT
LABEL OR SOLS (LABEL) ×3 IMPLANT
LIQUID BAND (GAUZE/BANDAGES/DRESSINGS) ×3 IMPLANT
MINICAP W/POVIDONE IODINE SOL (MISCELLANEOUS) ×3 IMPLANT
PACK LAP CHOLECYSTECTOMY (MISCELLANEOUS) ×3 IMPLANT
PENCIL ELECTRO HAND CTR (MISCELLANEOUS) ×3 IMPLANT
SET CYSTO W/LG BORE CLAMP LF (SET/KITS/TRAYS/PACK) ×3 IMPLANT
SET TRANSFER 6 W/TWIST CLAMP 5 (SET/KITS/TRAYS/PACK) ×3 IMPLANT
SPONGE EXCIL AMD DRAIN 4X4 6P (MISCELLANEOUS) ×3 IMPLANT
SUT MNCRL AB 4-0 PS2 18 (SUTURE) ×3 IMPLANT
SUT VIC AB 2-0 UR6 27 (SUTURE) ×3 IMPLANT
SUT VICRYL+ 3-0 36IN CT-1 (SUTURE) ×3 IMPLANT
TROCAR XCEL NON-BLD 11X100MML (ENDOMECHANICALS) ×3 IMPLANT
TUBING INSUFFLATOR HI FLOW (MISCELLANEOUS) ×3 IMPLANT

## 2016-01-06 NOTE — Discharge Instructions (Signed)
AMBULATORY SURGERY  °DISCHARGE INSTRUCTIONS ° ° °1) The drugs that you were given will stay in your system until tomorrow so for the next 24 hours you should not: ° °A) Drive an automobile °B) Make any legal decisions °C) Drink any alcoholic beverage ° ° °2) You may resume regular meals tomorrow.  Today it is better to start with liquids and gradually work up to solid foods. ° °You may eat anything you prefer, but it is better to start with liquids, then soup and crackers, and gradually work up to solid foods. ° ° °3) Please notify your doctor immediately if you have any unusual bleeding, trouble breathing, redness and pain at the surgery site, drainage, fever, or pain not relieved by medication. °4)  ° °5) Your post-operative visit with Dr.                     °           °     is: Date:                        Time:   ° °Please call to schedule your post-operative visit. ° °6) Additional Instructions: °7)  °

## 2016-01-06 NOTE — Op Note (Signed)
  OPERATIVE NOTE   PROCEDURE: 1. Laparoscopic peritoneal dialysis catheter revision.  PRE-OPERATIVE DIAGNOSIS: 1. ESRD 2. Non-functional PD catheter  POST-OPERATIVE DIAGNOSIS: Same  SURGEON: Leotis Pain, MD  ASSISTANT(S): None  ANESTHESIA: general  ESTIMATED BLOOD LOSS: Minimal   FINDING(S): 1. catheter pulled into right mid abdomen by bowel loops, minimal adhesions  SPECIMEN(S): None  INDICATIONS:  Patient presents with ESRD and a non-functional PD catheter. The patient has decided to do peritoneal dialysis for his long-term dialysis, but her current catheter is not working. Risks and benefits of revision/placement were discussed and she is agreeable to proceed.  Differences between peritoneal dialysis and hemodialysis were discussed.    DESCRIPTION: After obtaining full informed written consent, the patient was brought back to the operating room and placed supine upon the operating table. The patient received IV antibiotics prior to induction. After obtaining adequate anesthesia, the abdomen was prepped and draped in the standard fashion.. I then entered the peritoneum with an 30mm Optiview trocar placed in the right upper quadrant and insufflated the abdomen with carbon dioxide. I then placed a 5 mm trocar in the left upper quadrant under direct visualization and evaluated the catheter. It appeared that the catheter pulled into right mid abdomen by bowel loops, and there were minimal adhesions.  I then used a grasper through the 5 mm trocar and pulled the catheter out of the bowel loops and gently placed this into the pelvis. There was no tension, and the catheter appeared to lay nicely in the pelvis. The air was removed from the abdomen. The appropriate distal connectors were placed, and I then placed 500 cc of saline through the catheter into the pelvis. The abdomen was desufflated. Immediately, 400 cc of effluent returned through the catheter when the bag was placed to gravity.  I took one more look with the camera to ensure that the catheter was in the pelvis and it was. The 70mm trocar was then removed. The 5 mm trocar was also removed. I then closed the incisions with 3-0 Vicryl and 4-0 Monocryl and placed Dermabond as dressing. Dry dressing was placed around the catheter exit site. The patient was then awakened from anesthesia and taken to the recovery room in stable condition having tolerated the procedure well.  COMPLICATIONS: None  CONDITION: None  DEW,JASON

## 2016-01-06 NOTE — Anesthesia Postprocedure Evaluation (Signed)
Anesthesia Post Note  Patient: Bethany Lee  Procedure(s) Performed: Procedure(s) (LRB): LAPAROSCOPIC INSERTION CONTINUOUS AMBULATORY PERITONEAL DIALYSIS  (CAPD) CATHETER REVISION  (N/A)  Patient location during evaluation: PACU Anesthesia Type: General Level of consciousness: awake and alert Pain management: pain level controlled Vital Signs Assessment: post-procedure vital signs reviewed and stable Respiratory status: spontaneous breathing, nonlabored ventilation, respiratory function stable and patient connected to nasal cannula oxygen Cardiovascular status: blood pressure returned to baseline and stable Postop Assessment: no signs of nausea or vomiting Anesthetic complications: no    Last Vitals:  Filed Vitals:   01/06/16 1745 01/06/16 1755  BP:  179/75  Pulse: 82 83  Temp:  36.8 C  Resp: 19 12    Last Pain:  Filed Vitals:   01/06/16 1759  PainSc: 4                  Precious Haws [place]pperfurth

## 2016-01-06 NOTE — Anesthesia Procedure Notes (Signed)
Procedure Name: Intubation Date/Time: 01/06/2016 4:22 PM Performed by: Rosaria Ferries, Zebulon Gantt Pre-anesthesia Checklist: Patient identified, Emergency Drugs available, Suction available and Patient being monitored Patient Re-evaluated:Patient Re-evaluated prior to inductionOxygen Delivery Method: Circle system utilized Preoxygenation: Pre-oxygenation with 100% oxygen Intubation Type: IV induction Laryngoscope Size: Mac and 3 Grade View: Grade I Tube type: Oral Number of attempts: 1 Placement Confirmation: ETT inserted through vocal cords under direct vision,  positive ETCO2 and breath sounds checked- equal and bilateral Secured at: 21 cm Tube secured with: Tape Dental Injury: Teeth and Oropharynx as per pre-operative assessment

## 2016-01-06 NOTE — H&P (Addendum)
  [facility] History & Physical Update  The patient was interviewed and re-examined.  The patient's previous History and Physical has been reviewed and is unchanged.  There is no change in the plan of care. We plan to proceed with the scheduled procedure. On exam, Heart was RRR and lungs were CTAB and equal  DEW,JASON, MD  01/06/2016, 3:26 PM

## 2016-01-06 NOTE — Anesthesia Preprocedure Evaluation (Addendum)
Anesthesia Evaluation  Patient identified by MRN, date of birth, ID band Patient awake    Reviewed: Allergy & Precautions, NPO status , Patient's Chart, lab work & pertinent test results, reviewed documented beta blocker date and time   Airway Mallampati: II  TM Distance: >3 FB     Dental  (+) Chipped   Pulmonary former smoker,    Pulmonary exam normal breath sounds clear to auscultation       Cardiovascular hypertension, Pt. on medications Normal cardiovascular exam     Neuro/Psych negative neurological ROS  negative psych ROS   GI/Hepatic negative GI ROS, Neg liver ROS,   Endo/Other  diabetes, Well Controlled, Type 2, Oral Hypoglycemic Agents, Insulin Dependent  Renal/GU ESRF and DialysisRenal disease     Musculoskeletal negative musculoskeletal ROS (+)   Abdominal Normal abdominal exam  (+)   Peds  Hematology  (+) anemia ,   Anesthesia Other Findings Almost ESRD. Anemic hb 8.4. Platelets 148.  Reproductive/Obstetrics                            Anesthesia Physical  Anesthesia Plan  ASA: IV  Anesthesia Plan: General ETT   Post-op Pain Management:    Induction: Intravenous  Airway Management Planned: Oral ETT  Additional Equipment:   Intra-op Plan:   Post-operative Plan: Extubation in OR  Informed Consent: I have reviewed the patients History and Physical, chart, labs and discussed the procedure including the risks, benefits and alternatives for the proposed anesthesia with the patient or authorized representative who has indicated his/her understanding and acceptance.   Dental advisory given  Plan Discussed with: CRNA and Surgeon  Anesthesia Plan Comments:       Anesthesia Quick Evaluation

## 2016-01-06 NOTE — Transfer of Care (Signed)
Immediate Anesthesia Transfer of Care Note  Patient: Bethany Lee  Procedure(s) Performed: Procedure(s): LAPAROSCOPIC INSERTION CONTINUOUS AMBULATORY PERITONEAL DIALYSIS  (CAPD) CATHETER REVISION  (N/A)  Patient Location: PACU  Anesthesia Type:General  Level of Consciousness: sedated  Airway & Oxygen Therapy: Patient Spontanous Breathing and Patient connected to nasal cannula oxygen  Post-op Assessment: Report given to RN and Post -op Vital signs reviewed and stable  Post vital signs: Reviewed and stable  Last Vitals:  Filed Vitals:   01/06/16 1347 01/06/16 1710  BP: 178/67 169/75  Pulse: 94 95  Temp: 36.2 C 36.9 C  Resp: 16 10    Last Pain: There were no vitals filed for this visit.       Complications: No apparent anesthesia complications

## 2016-01-07 ENCOUNTER — Encounter: Payer: Self-pay | Admitting: Vascular Surgery

## 2016-01-07 LAB — POCT I-STAT 4, (NA,K, GLUC, HGB,HCT)
Glucose, Bld: 126 mg/dL — ABNORMAL HIGH (ref 65–99)
HCT: 29 % — ABNORMAL LOW (ref 36.0–46.0)
HEMOGLOBIN: 9.9 g/dL — AB (ref 12.0–15.0)
POTASSIUM: 4.8 mmol/L (ref 3.5–5.1)
SODIUM: 138 mmol/L (ref 135–145)

## 2016-01-12 DIAGNOSIS — N186 End stage renal disease: Secondary | ICD-10-CM | POA: Diagnosis not present

## 2016-01-12 DIAGNOSIS — Z992 Dependence on renal dialysis: Secondary | ICD-10-CM | POA: Diagnosis not present

## 2016-01-13 DIAGNOSIS — Z992 Dependence on renal dialysis: Secondary | ICD-10-CM | POA: Diagnosis not present

## 2016-01-13 DIAGNOSIS — N186 End stage renal disease: Secondary | ICD-10-CM | POA: Diagnosis not present

## 2016-01-14 DIAGNOSIS — N186 End stage renal disease: Secondary | ICD-10-CM | POA: Diagnosis not present

## 2016-01-14 DIAGNOSIS — Z992 Dependence on renal dialysis: Secondary | ICD-10-CM | POA: Diagnosis not present

## 2016-01-17 DIAGNOSIS — N186 End stage renal disease: Secondary | ICD-10-CM | POA: Diagnosis not present

## 2016-01-17 DIAGNOSIS — Z992 Dependence on renal dialysis: Secondary | ICD-10-CM | POA: Diagnosis not present

## 2016-01-18 DIAGNOSIS — N186 End stage renal disease: Secondary | ICD-10-CM | POA: Diagnosis not present

## 2016-01-18 DIAGNOSIS — Z794 Long term (current) use of insulin: Secondary | ICD-10-CM | POA: Diagnosis not present

## 2016-01-18 DIAGNOSIS — E1122 Type 2 diabetes mellitus with diabetic chronic kidney disease: Secondary | ICD-10-CM | POA: Diagnosis not present

## 2016-01-18 DIAGNOSIS — E782 Mixed hyperlipidemia: Secondary | ICD-10-CM | POA: Diagnosis not present

## 2016-01-18 DIAGNOSIS — E039 Hypothyroidism, unspecified: Secondary | ICD-10-CM | POA: Diagnosis not present

## 2016-01-18 DIAGNOSIS — E113412 Type 2 diabetes mellitus with severe nonproliferative diabetic retinopathy with macular edema, left eye: Secondary | ICD-10-CM | POA: Diagnosis not present

## 2016-01-18 DIAGNOSIS — E875 Hyperkalemia: Secondary | ICD-10-CM | POA: Diagnosis not present

## 2016-01-18 DIAGNOSIS — Z992 Dependence on renal dialysis: Secondary | ICD-10-CM | POA: Diagnosis not present

## 2016-01-19 DIAGNOSIS — Z992 Dependence on renal dialysis: Secondary | ICD-10-CM | POA: Diagnosis not present

## 2016-01-19 DIAGNOSIS — Z4889 Encounter for other specified surgical aftercare: Secondary | ICD-10-CM | POA: Diagnosis not present

## 2016-01-19 DIAGNOSIS — N185 Chronic kidney disease, stage 5: Secondary | ICD-10-CM | POA: Diagnosis not present

## 2016-01-19 DIAGNOSIS — E785 Hyperlipidemia, unspecified: Secondary | ICD-10-CM | POA: Diagnosis not present

## 2016-01-19 DIAGNOSIS — N186 End stage renal disease: Secondary | ICD-10-CM | POA: Diagnosis not present

## 2016-01-19 DIAGNOSIS — E119 Type 2 diabetes mellitus without complications: Secondary | ICD-10-CM | POA: Diagnosis not present

## 2016-01-19 DIAGNOSIS — I10 Essential (primary) hypertension: Secondary | ICD-10-CM | POA: Diagnosis not present

## 2016-01-19 DIAGNOSIS — Y841 Kidney dialysis as the cause of abnormal reaction of the patient, or of later complication, without mention of misadventure at the time of the procedure: Secondary | ICD-10-CM | POA: Diagnosis not present

## 2016-01-19 DIAGNOSIS — T82858A Stenosis of vascular prosthetic devices, implants and grafts, initial encounter: Secondary | ICD-10-CM | POA: Diagnosis not present

## 2016-01-20 DIAGNOSIS — Z992 Dependence on renal dialysis: Secondary | ICD-10-CM | POA: Diagnosis not present

## 2016-01-20 DIAGNOSIS — N186 End stage renal disease: Secondary | ICD-10-CM | POA: Diagnosis not present

## 2016-01-21 DIAGNOSIS — Z992 Dependence on renal dialysis: Secondary | ICD-10-CM | POA: Diagnosis not present

## 2016-01-21 DIAGNOSIS — N186 End stage renal disease: Secondary | ICD-10-CM | POA: Diagnosis not present

## 2016-01-24 DIAGNOSIS — Z992 Dependence on renal dialysis: Secondary | ICD-10-CM | POA: Diagnosis not present

## 2016-01-24 DIAGNOSIS — N186 End stage renal disease: Secondary | ICD-10-CM | POA: Diagnosis not present

## 2016-01-26 DIAGNOSIS — N186 End stage renal disease: Secondary | ICD-10-CM | POA: Diagnosis not present

## 2016-01-26 DIAGNOSIS — Z992 Dependence on renal dialysis: Secondary | ICD-10-CM | POA: Diagnosis not present

## 2016-01-27 DIAGNOSIS — N186 End stage renal disease: Secondary | ICD-10-CM | POA: Diagnosis not present

## 2016-01-27 DIAGNOSIS — Z992 Dependence on renal dialysis: Secondary | ICD-10-CM | POA: Diagnosis not present

## 2016-01-28 DIAGNOSIS — E785 Hyperlipidemia, unspecified: Secondary | ICD-10-CM | POA: Diagnosis not present

## 2016-01-28 DIAGNOSIS — E119 Type 2 diabetes mellitus without complications: Secondary | ICD-10-CM | POA: Diagnosis not present

## 2016-01-28 DIAGNOSIS — Z1159 Encounter for screening for other viral diseases: Secondary | ICD-10-CM | POA: Diagnosis not present

## 2016-01-28 DIAGNOSIS — Z794 Long term (current) use of insulin: Secondary | ICD-10-CM | POA: Diagnosis not present

## 2016-01-28 DIAGNOSIS — N186 End stage renal disease: Secondary | ICD-10-CM | POA: Diagnosis not present

## 2016-01-28 DIAGNOSIS — Z992 Dependence on renal dialysis: Secondary | ICD-10-CM | POA: Diagnosis not present

## 2016-01-31 DIAGNOSIS — Z992 Dependence on renal dialysis: Secondary | ICD-10-CM | POA: Diagnosis not present

## 2016-01-31 DIAGNOSIS — N186 End stage renal disease: Secondary | ICD-10-CM | POA: Diagnosis not present

## 2016-02-01 ENCOUNTER — Ambulatory Visit: Payer: Self-pay

## 2016-02-01 ENCOUNTER — Ambulatory Visit: Payer: Self-pay | Admitting: Oncology

## 2016-02-01 ENCOUNTER — Other Ambulatory Visit: Payer: Self-pay

## 2016-02-01 DIAGNOSIS — Z992 Dependence on renal dialysis: Secondary | ICD-10-CM | POA: Diagnosis not present

## 2016-02-01 DIAGNOSIS — N186 End stage renal disease: Secondary | ICD-10-CM | POA: Diagnosis not present

## 2016-02-02 DIAGNOSIS — Z992 Dependence on renal dialysis: Secondary | ICD-10-CM | POA: Diagnosis not present

## 2016-02-02 DIAGNOSIS — N186 End stage renal disease: Secondary | ICD-10-CM | POA: Diagnosis not present

## 2016-02-03 ENCOUNTER — Other Ambulatory Visit: Payer: Self-pay

## 2016-02-03 ENCOUNTER — Ambulatory Visit: Payer: Self-pay

## 2016-02-03 ENCOUNTER — Ambulatory Visit: Payer: Self-pay | Admitting: Oncology

## 2016-02-05 DIAGNOSIS — Z992 Dependence on renal dialysis: Secondary | ICD-10-CM | POA: Diagnosis not present

## 2016-02-05 DIAGNOSIS — N186 End stage renal disease: Secondary | ICD-10-CM | POA: Diagnosis not present

## 2016-02-06 DIAGNOSIS — N186 End stage renal disease: Secondary | ICD-10-CM | POA: Diagnosis not present

## 2016-02-06 DIAGNOSIS — Z992 Dependence on renal dialysis: Secondary | ICD-10-CM | POA: Diagnosis not present

## 2016-02-07 DIAGNOSIS — Z992 Dependence on renal dialysis: Secondary | ICD-10-CM | POA: Diagnosis not present

## 2016-02-07 DIAGNOSIS — E113511 Type 2 diabetes mellitus with proliferative diabetic retinopathy with macular edema, right eye: Secondary | ICD-10-CM | POA: Diagnosis not present

## 2016-02-07 DIAGNOSIS — N186 End stage renal disease: Secondary | ICD-10-CM | POA: Diagnosis not present

## 2016-02-08 DIAGNOSIS — Z992 Dependence on renal dialysis: Secondary | ICD-10-CM | POA: Diagnosis not present

## 2016-02-08 DIAGNOSIS — N186 End stage renal disease: Secondary | ICD-10-CM | POA: Diagnosis not present

## 2016-02-09 DIAGNOSIS — N186 End stage renal disease: Secondary | ICD-10-CM | POA: Diagnosis not present

## 2016-02-09 DIAGNOSIS — Z992 Dependence on renal dialysis: Secondary | ICD-10-CM | POA: Diagnosis not present

## 2016-02-10 DIAGNOSIS — Z992 Dependence on renal dialysis: Secondary | ICD-10-CM | POA: Diagnosis not present

## 2016-02-10 DIAGNOSIS — N186 End stage renal disease: Secondary | ICD-10-CM | POA: Diagnosis not present

## 2016-02-11 DIAGNOSIS — N186 End stage renal disease: Secondary | ICD-10-CM | POA: Diagnosis not present

## 2016-02-11 DIAGNOSIS — Z992 Dependence on renal dialysis: Secondary | ICD-10-CM | POA: Diagnosis not present

## 2016-02-12 DIAGNOSIS — N186 End stage renal disease: Secondary | ICD-10-CM | POA: Diagnosis not present

## 2016-02-12 DIAGNOSIS — Z992 Dependence on renal dialysis: Secondary | ICD-10-CM | POA: Diagnosis not present

## 2016-02-13 DIAGNOSIS — Z992 Dependence on renal dialysis: Secondary | ICD-10-CM | POA: Diagnosis not present

## 2016-02-13 DIAGNOSIS — N186 End stage renal disease: Secondary | ICD-10-CM | POA: Diagnosis not present

## 2016-02-14 DIAGNOSIS — Z992 Dependence on renal dialysis: Secondary | ICD-10-CM | POA: Diagnosis not present

## 2016-02-14 DIAGNOSIS — N186 End stage renal disease: Secondary | ICD-10-CM | POA: Diagnosis not present

## 2016-02-15 DIAGNOSIS — N186 End stage renal disease: Secondary | ICD-10-CM | POA: Diagnosis not present

## 2016-02-15 DIAGNOSIS — Z992 Dependence on renal dialysis: Secondary | ICD-10-CM | POA: Diagnosis not present

## 2016-02-16 DIAGNOSIS — Z992 Dependence on renal dialysis: Secondary | ICD-10-CM | POA: Diagnosis not present

## 2016-02-16 DIAGNOSIS — N186 End stage renal disease: Secondary | ICD-10-CM | POA: Diagnosis not present

## 2016-02-17 DIAGNOSIS — N186 End stage renal disease: Secondary | ICD-10-CM | POA: Diagnosis not present

## 2016-02-17 DIAGNOSIS — Z992 Dependence on renal dialysis: Secondary | ICD-10-CM | POA: Diagnosis not present

## 2016-02-18 DIAGNOSIS — N186 End stage renal disease: Secondary | ICD-10-CM | POA: Diagnosis not present

## 2016-02-18 DIAGNOSIS — Z992 Dependence on renal dialysis: Secondary | ICD-10-CM | POA: Diagnosis not present

## 2016-02-19 DIAGNOSIS — Z992 Dependence on renal dialysis: Secondary | ICD-10-CM | POA: Diagnosis not present

## 2016-02-19 DIAGNOSIS — N186 End stage renal disease: Secondary | ICD-10-CM | POA: Diagnosis not present

## 2016-02-20 DIAGNOSIS — Z992 Dependence on renal dialysis: Secondary | ICD-10-CM | POA: Diagnosis not present

## 2016-02-20 DIAGNOSIS — N186 End stage renal disease: Secondary | ICD-10-CM | POA: Diagnosis not present

## 2016-02-21 DIAGNOSIS — N186 End stage renal disease: Secondary | ICD-10-CM | POA: Diagnosis not present

## 2016-02-21 DIAGNOSIS — Z992 Dependence on renal dialysis: Secondary | ICD-10-CM | POA: Diagnosis not present

## 2016-02-22 DIAGNOSIS — N186 End stage renal disease: Secondary | ICD-10-CM | POA: Diagnosis not present

## 2016-02-22 DIAGNOSIS — Z992 Dependence on renal dialysis: Secondary | ICD-10-CM | POA: Diagnosis not present

## 2016-02-23 DIAGNOSIS — Z992 Dependence on renal dialysis: Secondary | ICD-10-CM | POA: Diagnosis not present

## 2016-02-23 DIAGNOSIS — N186 End stage renal disease: Secondary | ICD-10-CM | POA: Diagnosis not present

## 2016-02-24 DIAGNOSIS — Z992 Dependence on renal dialysis: Secondary | ICD-10-CM | POA: Diagnosis not present

## 2016-02-24 DIAGNOSIS — N186 End stage renal disease: Secondary | ICD-10-CM | POA: Diagnosis not present

## 2016-02-25 DIAGNOSIS — Z992 Dependence on renal dialysis: Secondary | ICD-10-CM | POA: Diagnosis not present

## 2016-02-25 DIAGNOSIS — N186 End stage renal disease: Secondary | ICD-10-CM | POA: Diagnosis not present

## 2016-02-26 DIAGNOSIS — N186 End stage renal disease: Secondary | ICD-10-CM | POA: Diagnosis not present

## 2016-02-26 DIAGNOSIS — Z992 Dependence on renal dialysis: Secondary | ICD-10-CM | POA: Diagnosis not present

## 2016-02-27 DIAGNOSIS — Z992 Dependence on renal dialysis: Secondary | ICD-10-CM | POA: Diagnosis not present

## 2016-02-27 DIAGNOSIS — N186 End stage renal disease: Secondary | ICD-10-CM | POA: Diagnosis not present

## 2016-02-28 DIAGNOSIS — Z992 Dependence on renal dialysis: Secondary | ICD-10-CM | POA: Diagnosis not present

## 2016-02-28 DIAGNOSIS — N186 End stage renal disease: Secondary | ICD-10-CM | POA: Diagnosis not present

## 2016-02-29 DIAGNOSIS — Z992 Dependence on renal dialysis: Secondary | ICD-10-CM | POA: Diagnosis not present

## 2016-02-29 DIAGNOSIS — N186 End stage renal disease: Secondary | ICD-10-CM | POA: Diagnosis not present

## 2016-03-01 DIAGNOSIS — Z992 Dependence on renal dialysis: Secondary | ICD-10-CM | POA: Diagnosis not present

## 2016-03-01 DIAGNOSIS — N186 End stage renal disease: Secondary | ICD-10-CM | POA: Diagnosis not present

## 2016-03-02 DIAGNOSIS — N186 End stage renal disease: Secondary | ICD-10-CM | POA: Diagnosis not present

## 2016-03-02 DIAGNOSIS — Z992 Dependence on renal dialysis: Secondary | ICD-10-CM | POA: Diagnosis not present

## 2016-03-03 DIAGNOSIS — N186 End stage renal disease: Secondary | ICD-10-CM | POA: Diagnosis not present

## 2016-03-03 DIAGNOSIS — Z992 Dependence on renal dialysis: Secondary | ICD-10-CM | POA: Diagnosis not present

## 2016-03-04 DIAGNOSIS — N186 End stage renal disease: Secondary | ICD-10-CM | POA: Diagnosis not present

## 2016-03-04 DIAGNOSIS — Z992 Dependence on renal dialysis: Secondary | ICD-10-CM | POA: Diagnosis not present

## 2016-03-05 DIAGNOSIS — N186 End stage renal disease: Secondary | ICD-10-CM | POA: Diagnosis not present

## 2016-03-05 DIAGNOSIS — Z992 Dependence on renal dialysis: Secondary | ICD-10-CM | POA: Diagnosis not present

## 2016-03-06 DIAGNOSIS — Z992 Dependence on renal dialysis: Secondary | ICD-10-CM | POA: Diagnosis not present

## 2016-03-06 DIAGNOSIS — N186 End stage renal disease: Secondary | ICD-10-CM | POA: Diagnosis not present

## 2016-03-07 DIAGNOSIS — N186 End stage renal disease: Secondary | ICD-10-CM | POA: Diagnosis not present

## 2016-03-07 DIAGNOSIS — Z992 Dependence on renal dialysis: Secondary | ICD-10-CM | POA: Diagnosis not present

## 2016-03-08 DIAGNOSIS — N186 End stage renal disease: Secondary | ICD-10-CM | POA: Diagnosis not present

## 2016-03-08 DIAGNOSIS — Z992 Dependence on renal dialysis: Secondary | ICD-10-CM | POA: Diagnosis not present

## 2016-03-09 DIAGNOSIS — Z992 Dependence on renal dialysis: Secondary | ICD-10-CM | POA: Diagnosis not present

## 2016-03-09 DIAGNOSIS — N186 End stage renal disease: Secondary | ICD-10-CM | POA: Diagnosis not present

## 2016-03-10 DIAGNOSIS — N186 End stage renal disease: Secondary | ICD-10-CM | POA: Diagnosis not present

## 2016-03-10 DIAGNOSIS — Z992 Dependence on renal dialysis: Secondary | ICD-10-CM | POA: Diagnosis not present

## 2016-03-11 DIAGNOSIS — Z992 Dependence on renal dialysis: Secondary | ICD-10-CM | POA: Diagnosis not present

## 2016-03-11 DIAGNOSIS — N186 End stage renal disease: Secondary | ICD-10-CM | POA: Diagnosis not present

## 2016-03-12 DIAGNOSIS — Z992 Dependence on renal dialysis: Secondary | ICD-10-CM | POA: Diagnosis not present

## 2016-03-12 DIAGNOSIS — N186 End stage renal disease: Secondary | ICD-10-CM | POA: Diagnosis not present

## 2016-03-13 DIAGNOSIS — N186 End stage renal disease: Secondary | ICD-10-CM | POA: Diagnosis not present

## 2016-03-13 DIAGNOSIS — Z992 Dependence on renal dialysis: Secondary | ICD-10-CM | POA: Diagnosis not present

## 2016-03-14 DIAGNOSIS — N186 End stage renal disease: Secondary | ICD-10-CM | POA: Diagnosis not present

## 2016-03-14 DIAGNOSIS — Z992 Dependence on renal dialysis: Secondary | ICD-10-CM | POA: Diagnosis not present

## 2016-03-14 DIAGNOSIS — E113511 Type 2 diabetes mellitus with proliferative diabetic retinopathy with macular edema, right eye: Secondary | ICD-10-CM | POA: Diagnosis not present

## 2016-03-15 DIAGNOSIS — Z992 Dependence on renal dialysis: Secondary | ICD-10-CM | POA: Diagnosis not present

## 2016-03-15 DIAGNOSIS — N186 End stage renal disease: Secondary | ICD-10-CM | POA: Diagnosis not present

## 2016-03-16 DIAGNOSIS — N186 End stage renal disease: Secondary | ICD-10-CM | POA: Diagnosis not present

## 2016-03-16 DIAGNOSIS — Z992 Dependence on renal dialysis: Secondary | ICD-10-CM | POA: Diagnosis not present

## 2016-03-17 DIAGNOSIS — H2513 Age-related nuclear cataract, bilateral: Secondary | ICD-10-CM | POA: Diagnosis not present

## 2016-03-17 DIAGNOSIS — N186 End stage renal disease: Secondary | ICD-10-CM | POA: Diagnosis not present

## 2016-03-17 DIAGNOSIS — Z992 Dependence on renal dialysis: Secondary | ICD-10-CM | POA: Diagnosis not present

## 2016-03-18 DIAGNOSIS — N186 End stage renal disease: Secondary | ICD-10-CM | POA: Diagnosis not present

## 2016-03-18 DIAGNOSIS — Z992 Dependence on renal dialysis: Secondary | ICD-10-CM | POA: Diagnosis not present

## 2016-03-19 DIAGNOSIS — N186 End stage renal disease: Secondary | ICD-10-CM | POA: Diagnosis not present

## 2016-03-19 DIAGNOSIS — Z992 Dependence on renal dialysis: Secondary | ICD-10-CM | POA: Diagnosis not present

## 2016-03-20 DIAGNOSIS — N186 End stage renal disease: Secondary | ICD-10-CM | POA: Diagnosis not present

## 2016-03-20 DIAGNOSIS — Z992 Dependence on renal dialysis: Secondary | ICD-10-CM | POA: Diagnosis not present

## 2016-03-21 DIAGNOSIS — Z992 Dependence on renal dialysis: Secondary | ICD-10-CM | POA: Diagnosis not present

## 2016-03-21 DIAGNOSIS — N186 End stage renal disease: Secondary | ICD-10-CM | POA: Diagnosis not present

## 2016-03-22 DIAGNOSIS — Z992 Dependence on renal dialysis: Secondary | ICD-10-CM | POA: Diagnosis not present

## 2016-03-22 DIAGNOSIS — N186 End stage renal disease: Secondary | ICD-10-CM | POA: Diagnosis not present

## 2016-03-23 DIAGNOSIS — N186 End stage renal disease: Secondary | ICD-10-CM | POA: Diagnosis not present

## 2016-03-23 DIAGNOSIS — Z992 Dependence on renal dialysis: Secondary | ICD-10-CM | POA: Diagnosis not present

## 2016-03-24 DIAGNOSIS — Z992 Dependence on renal dialysis: Secondary | ICD-10-CM | POA: Diagnosis not present

## 2016-03-24 DIAGNOSIS — Z23 Encounter for immunization: Secondary | ICD-10-CM | POA: Diagnosis not present

## 2016-03-24 DIAGNOSIS — N186 End stage renal disease: Secondary | ICD-10-CM | POA: Diagnosis not present

## 2016-03-26 DIAGNOSIS — Z23 Encounter for immunization: Secondary | ICD-10-CM | POA: Diagnosis not present

## 2016-03-26 DIAGNOSIS — Z992 Dependence on renal dialysis: Secondary | ICD-10-CM | POA: Diagnosis not present

## 2016-03-26 DIAGNOSIS — N186 End stage renal disease: Secondary | ICD-10-CM | POA: Diagnosis not present

## 2016-03-27 DIAGNOSIS — Z23 Encounter for immunization: Secondary | ICD-10-CM | POA: Diagnosis not present

## 2016-03-27 DIAGNOSIS — Z992 Dependence on renal dialysis: Secondary | ICD-10-CM | POA: Diagnosis not present

## 2016-03-27 DIAGNOSIS — N186 End stage renal disease: Secondary | ICD-10-CM | POA: Diagnosis not present

## 2016-03-28 DIAGNOSIS — Z992 Dependence on renal dialysis: Secondary | ICD-10-CM | POA: Diagnosis not present

## 2016-03-28 DIAGNOSIS — Z23 Encounter for immunization: Secondary | ICD-10-CM | POA: Diagnosis not present

## 2016-03-28 DIAGNOSIS — N186 End stage renal disease: Secondary | ICD-10-CM | POA: Diagnosis not present

## 2016-03-29 DIAGNOSIS — Z23 Encounter for immunization: Secondary | ICD-10-CM | POA: Diagnosis not present

## 2016-03-29 DIAGNOSIS — Z992 Dependence on renal dialysis: Secondary | ICD-10-CM | POA: Diagnosis not present

## 2016-03-29 DIAGNOSIS — N186 End stage renal disease: Secondary | ICD-10-CM | POA: Diagnosis not present

## 2016-03-30 DIAGNOSIS — N186 End stage renal disease: Secondary | ICD-10-CM | POA: Diagnosis not present

## 2016-03-30 DIAGNOSIS — Z23 Encounter for immunization: Secondary | ICD-10-CM | POA: Diagnosis not present

## 2016-03-30 DIAGNOSIS — Z992 Dependence on renal dialysis: Secondary | ICD-10-CM | POA: Diagnosis not present

## 2016-03-31 DIAGNOSIS — N186 End stage renal disease: Secondary | ICD-10-CM | POA: Diagnosis not present

## 2016-03-31 DIAGNOSIS — Z23 Encounter for immunization: Secondary | ICD-10-CM | POA: Diagnosis not present

## 2016-03-31 DIAGNOSIS — Z992 Dependence on renal dialysis: Secondary | ICD-10-CM | POA: Diagnosis not present

## 2016-04-02 DIAGNOSIS — N186 End stage renal disease: Secondary | ICD-10-CM | POA: Diagnosis not present

## 2016-04-02 DIAGNOSIS — Z992 Dependence on renal dialysis: Secondary | ICD-10-CM | POA: Diagnosis not present

## 2016-04-02 DIAGNOSIS — Z23 Encounter for immunization: Secondary | ICD-10-CM | POA: Diagnosis not present

## 2016-04-03 DIAGNOSIS — Z992 Dependence on renal dialysis: Secondary | ICD-10-CM | POA: Diagnosis not present

## 2016-04-03 DIAGNOSIS — N186 End stage renal disease: Secondary | ICD-10-CM | POA: Diagnosis not present

## 2016-04-03 DIAGNOSIS — Z23 Encounter for immunization: Secondary | ICD-10-CM | POA: Diagnosis not present

## 2016-04-04 DIAGNOSIS — Z992 Dependence on renal dialysis: Secondary | ICD-10-CM | POA: Diagnosis not present

## 2016-04-04 DIAGNOSIS — Z23 Encounter for immunization: Secondary | ICD-10-CM | POA: Diagnosis not present

## 2016-04-04 DIAGNOSIS — N186 End stage renal disease: Secondary | ICD-10-CM | POA: Diagnosis not present

## 2016-04-05 DIAGNOSIS — N186 End stage renal disease: Secondary | ICD-10-CM | POA: Diagnosis not present

## 2016-04-05 DIAGNOSIS — Z992 Dependence on renal dialysis: Secondary | ICD-10-CM | POA: Diagnosis not present

## 2016-04-05 DIAGNOSIS — Z23 Encounter for immunization: Secondary | ICD-10-CM | POA: Diagnosis not present

## 2016-04-06 DIAGNOSIS — Z23 Encounter for immunization: Secondary | ICD-10-CM | POA: Diagnosis not present

## 2016-04-06 DIAGNOSIS — N186 End stage renal disease: Secondary | ICD-10-CM | POA: Diagnosis not present

## 2016-04-06 DIAGNOSIS — Z992 Dependence on renal dialysis: Secondary | ICD-10-CM | POA: Diagnosis not present

## 2016-04-07 DIAGNOSIS — N186 End stage renal disease: Secondary | ICD-10-CM | POA: Diagnosis not present

## 2016-04-07 DIAGNOSIS — Z992 Dependence on renal dialysis: Secondary | ICD-10-CM | POA: Diagnosis not present

## 2016-04-07 DIAGNOSIS — Z23 Encounter for immunization: Secondary | ICD-10-CM | POA: Diagnosis not present

## 2016-04-09 DIAGNOSIS — Z23 Encounter for immunization: Secondary | ICD-10-CM | POA: Diagnosis not present

## 2016-04-09 DIAGNOSIS — Z992 Dependence on renal dialysis: Secondary | ICD-10-CM | POA: Diagnosis not present

## 2016-04-09 DIAGNOSIS — N186 End stage renal disease: Secondary | ICD-10-CM | POA: Diagnosis not present

## 2016-04-10 DIAGNOSIS — N186 End stage renal disease: Secondary | ICD-10-CM | POA: Diagnosis not present

## 2016-04-10 DIAGNOSIS — Z23 Encounter for immunization: Secondary | ICD-10-CM | POA: Diagnosis not present

## 2016-04-10 DIAGNOSIS — Z992 Dependence on renal dialysis: Secondary | ICD-10-CM | POA: Diagnosis not present

## 2016-04-11 DIAGNOSIS — N186 End stage renal disease: Secondary | ICD-10-CM | POA: Diagnosis not present

## 2016-04-11 DIAGNOSIS — Z23 Encounter for immunization: Secondary | ICD-10-CM | POA: Diagnosis not present

## 2016-04-11 DIAGNOSIS — Z992 Dependence on renal dialysis: Secondary | ICD-10-CM | POA: Diagnosis not present

## 2016-04-12 DIAGNOSIS — N186 End stage renal disease: Secondary | ICD-10-CM | POA: Diagnosis not present

## 2016-04-12 DIAGNOSIS — E113511 Type 2 diabetes mellitus with proliferative diabetic retinopathy with macular edema, right eye: Secondary | ICD-10-CM | POA: Diagnosis not present

## 2016-04-12 DIAGNOSIS — Z992 Dependence on renal dialysis: Secondary | ICD-10-CM | POA: Diagnosis not present

## 2016-04-12 DIAGNOSIS — Z23 Encounter for immunization: Secondary | ICD-10-CM | POA: Diagnosis not present

## 2016-04-13 DIAGNOSIS — Z23 Encounter for immunization: Secondary | ICD-10-CM | POA: Diagnosis not present

## 2016-04-13 DIAGNOSIS — Z992 Dependence on renal dialysis: Secondary | ICD-10-CM | POA: Diagnosis not present

## 2016-04-13 DIAGNOSIS — N186 End stage renal disease: Secondary | ICD-10-CM | POA: Diagnosis not present

## 2016-04-14 DIAGNOSIS — Z23 Encounter for immunization: Secondary | ICD-10-CM | POA: Diagnosis not present

## 2016-04-14 DIAGNOSIS — Z992 Dependence on renal dialysis: Secondary | ICD-10-CM | POA: Diagnosis not present

## 2016-04-14 DIAGNOSIS — N186 End stage renal disease: Secondary | ICD-10-CM | POA: Diagnosis not present

## 2016-04-16 DIAGNOSIS — Z23 Encounter for immunization: Secondary | ICD-10-CM | POA: Diagnosis not present

## 2016-04-16 DIAGNOSIS — N186 End stage renal disease: Secondary | ICD-10-CM | POA: Diagnosis not present

## 2016-04-16 DIAGNOSIS — Z992 Dependence on renal dialysis: Secondary | ICD-10-CM | POA: Diagnosis not present

## 2016-04-17 DIAGNOSIS — Z23 Encounter for immunization: Secondary | ICD-10-CM | POA: Diagnosis not present

## 2016-04-17 DIAGNOSIS — N186 End stage renal disease: Secondary | ICD-10-CM | POA: Diagnosis not present

## 2016-04-17 DIAGNOSIS — Z992 Dependence on renal dialysis: Secondary | ICD-10-CM | POA: Diagnosis not present

## 2016-04-18 DIAGNOSIS — N186 End stage renal disease: Secondary | ICD-10-CM | POA: Diagnosis not present

## 2016-04-18 DIAGNOSIS — Z23 Encounter for immunization: Secondary | ICD-10-CM | POA: Diagnosis not present

## 2016-04-18 DIAGNOSIS — Z992 Dependence on renal dialysis: Secondary | ICD-10-CM | POA: Diagnosis not present

## 2016-04-19 DIAGNOSIS — N186 End stage renal disease: Secondary | ICD-10-CM | POA: Diagnosis not present

## 2016-04-19 DIAGNOSIS — Z23 Encounter for immunization: Secondary | ICD-10-CM | POA: Diagnosis not present

## 2016-04-19 DIAGNOSIS — Z992 Dependence on renal dialysis: Secondary | ICD-10-CM | POA: Diagnosis not present

## 2016-04-20 DIAGNOSIS — Z23 Encounter for immunization: Secondary | ICD-10-CM | POA: Diagnosis not present

## 2016-04-20 DIAGNOSIS — Z992 Dependence on renal dialysis: Secondary | ICD-10-CM | POA: Diagnosis not present

## 2016-04-20 DIAGNOSIS — N186 End stage renal disease: Secondary | ICD-10-CM | POA: Diagnosis not present

## 2016-04-21 DIAGNOSIS — N186 End stage renal disease: Secondary | ICD-10-CM | POA: Diagnosis not present

## 2016-04-21 DIAGNOSIS — Z992 Dependence on renal dialysis: Secondary | ICD-10-CM | POA: Diagnosis not present

## 2016-04-21 DIAGNOSIS — Z23 Encounter for immunization: Secondary | ICD-10-CM | POA: Diagnosis not present

## 2016-04-22 DIAGNOSIS — Z992 Dependence on renal dialysis: Secondary | ICD-10-CM | POA: Diagnosis not present

## 2016-04-22 DIAGNOSIS — N186 End stage renal disease: Secondary | ICD-10-CM | POA: Diagnosis not present

## 2016-04-23 DIAGNOSIS — N186 End stage renal disease: Secondary | ICD-10-CM | POA: Diagnosis not present

## 2016-04-23 DIAGNOSIS — Z992 Dependence on renal dialysis: Secondary | ICD-10-CM | POA: Diagnosis not present

## 2016-04-24 DIAGNOSIS — Z794 Long term (current) use of insulin: Secondary | ICD-10-CM | POA: Diagnosis not present

## 2016-04-24 DIAGNOSIS — Z992 Dependence on renal dialysis: Secondary | ICD-10-CM | POA: Diagnosis not present

## 2016-04-24 DIAGNOSIS — E119 Type 2 diabetes mellitus without complications: Secondary | ICD-10-CM | POA: Diagnosis not present

## 2016-04-24 DIAGNOSIS — E785 Hyperlipidemia, unspecified: Secondary | ICD-10-CM | POA: Diagnosis not present

## 2016-04-24 DIAGNOSIS — N186 End stage renal disease: Secondary | ICD-10-CM | POA: Diagnosis not present

## 2016-04-25 DIAGNOSIS — N186 End stage renal disease: Secondary | ICD-10-CM | POA: Diagnosis not present

## 2016-04-25 DIAGNOSIS — Z992 Dependence on renal dialysis: Secondary | ICD-10-CM | POA: Diagnosis not present

## 2016-04-26 DIAGNOSIS — Z992 Dependence on renal dialysis: Secondary | ICD-10-CM | POA: Diagnosis not present

## 2016-04-26 DIAGNOSIS — N186 End stage renal disease: Secondary | ICD-10-CM | POA: Diagnosis not present

## 2016-04-26 DIAGNOSIS — Z794 Long term (current) use of insulin: Secondary | ICD-10-CM | POA: Diagnosis not present

## 2016-04-26 DIAGNOSIS — E1122 Type 2 diabetes mellitus with diabetic chronic kidney disease: Secondary | ICD-10-CM | POA: Diagnosis not present

## 2016-04-27 DIAGNOSIS — N186 End stage renal disease: Secondary | ICD-10-CM | POA: Diagnosis not present

## 2016-04-27 DIAGNOSIS — Z992 Dependence on renal dialysis: Secondary | ICD-10-CM | POA: Diagnosis not present

## 2016-04-28 ENCOUNTER — Emergency Department
Admission: EM | Admit: 2016-04-28 | Discharge: 2016-04-28 | Disposition: A | Discharge status: Home / Self Care | Payer: Commercial Managed Care - HMO | Attending: Emergency Medicine | Admitting: Emergency Medicine

## 2016-04-28 ENCOUNTER — Encounter: Payer: Self-pay | Admitting: Emergency Medicine

## 2016-04-28 DIAGNOSIS — N186 End stage renal disease: Secondary | ICD-10-CM | POA: Diagnosis not present

## 2016-04-28 DIAGNOSIS — M5432 Sciatica, left side: Secondary | ICD-10-CM | POA: Insufficient documentation

## 2016-04-28 DIAGNOSIS — E1122 Type 2 diabetes mellitus with diabetic chronic kidney disease: Secondary | ICD-10-CM | POA: Insufficient documentation

## 2016-04-28 DIAGNOSIS — Z7982 Long term (current) use of aspirin: Secondary | ICD-10-CM | POA: Diagnosis not present

## 2016-04-28 DIAGNOSIS — Z992 Dependence on renal dialysis: Secondary | ICD-10-CM | POA: Insufficient documentation

## 2016-04-28 DIAGNOSIS — M545 Low back pain: Secondary | ICD-10-CM | POA: Diagnosis present

## 2016-04-28 DIAGNOSIS — E113511 Type 2 diabetes mellitus with proliferative diabetic retinopathy with macular edema, right eye: Secondary | ICD-10-CM | POA: Diagnosis not present

## 2016-04-28 DIAGNOSIS — M5442 Lumbago with sciatica, left side: Secondary | ICD-10-CM | POA: Diagnosis not present

## 2016-04-28 DIAGNOSIS — Z87891 Personal history of nicotine dependence: Secondary | ICD-10-CM | POA: Insufficient documentation

## 2016-04-28 DIAGNOSIS — Z794 Long term (current) use of insulin: Secondary | ICD-10-CM | POA: Diagnosis not present

## 2016-04-28 DIAGNOSIS — I120 Hypertensive chronic kidney disease with stage 5 chronic kidney disease or end stage renal disease: Secondary | ICD-10-CM | POA: Diagnosis not present

## 2016-04-28 MED ORDER — PREDNISONE 50 MG PO TABS: 50.0000 mg | ORAL_TABLET | Freq: Every day | ORAL | 0 refills | Status: DC

## 2016-04-28 NOTE — ED Provider Notes (Signed)
[facility] Emergency Department Provider Note  ____________________________________________  Time seen: Approximately 2:23 PM  I have reviewed the triage vital signs and the nursing notes.   HISTORY  Chief Complaint Back Pain    HPI Bethany Lee is a 65 y.o. female who presents to the emergency department complaining of left lower back pain that radiates into the left hip. Patient denies any trauma or injury. Patient is a dialysis patient and states that she sits for long hours at a time. She has never had back problems in the past. She has tried Tylenol for this complaint with no relief. Patient states that it is a burning sensation. She denies any numbness or tingling in her feet. No dysuria or polyuria. Patient has been seen by her nephrologist since onset of symptoms.   Past Medical History:  Diagnosis Date  . Anemia   . Chronic kidney disease   . Diabetes mellitus without complication ([place])   . Hypercholesterolemia   . Hypertension     Patient Active Problem List   Diagnosis Date Noted  . ESRD on dialysis ([place]) 12/21/2015  . Peritoneal dialysis status ([place]) 12/21/2015  . Leg swelling 12/21/2015  . Anemia of chronic disease 12/21/2015  . Diabetes mellitus ([place]) 12/21/2015  . Hyperkalemia 12/21/2015  . 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 Anemia in chronic renal disease 10/04/2015  . Proteinuria 05/25/2015    Past Surgical History:  Procedure Laterality Date  . ABDOMINAL HYSTERECTOMY    . CAPD INSERTION N/A 11/25/2015   Procedure: LAPAROSCOPIC INSERTION CONTINUOUS AMBULATORY PERITONEAL DIALYSIS  (CAPD) CATHETER;  Surgeon: Algernon Huxley, MD;  Location: [facility] ORS;  Service: Vascular;  Laterality: N/A;  . CAPD INSERTION N/A 01/06/2016   Procedure: LAPAROSCOPIC INSERTION CONTINUOUS AMBULATORY PERITONEAL DIALYSIS  (CAPD) CATHETER REVISION ;  Surgeon: Algernon Huxley, MD;  Location: [facility] ORS;  Service: Vascular;  Laterality: N/A;  . EYE SURGERY    . TUBAL  LIGATION      Prior to Admission medications   Medication Sig Start Date End Date Taking? Authorizing Provider  amLODipine (NORVASC) 5 MG tablet Take 5 mg by mouth daily.    Historical Provider, MD  aspirin EC 81 MG tablet Take 1 tablet (81 mg total) by mouth daily. Start taking after NOV 15 05/26/15   Harmeet Singh, MD  furosemide (LASIX) 40 MG tablet Take 1 tablet (40 mg total) by mouth 2 (two) times daily with breakfast and lunch. Take as needed for Leg swelling 05/26/15   Murlean Iba, MD  gabapentin (NEURONTIN) 300 MG capsule Take 300 mg by mouth 3 (three) times daily. Reported on 01/06/2016    Historical Provider, MD  gentamicin cream (GARAMYCIN) 0.1 % Apply 1 application topically daily. 12/21/15   Theodoro Grist, MD  HYDROcodone-acetaminophen (NORCO) 5-325 MG tablet Take 1 tablet by mouth every 6 (six) hours as needed for moderate pain. 01/06/16   Algernon Huxley, MD  insulin aspart (NOVOLOG FLEXPEN) 100 UNIT/ML FlexPen 4-8 Units 3 (three) times daily with meals.  09/13/15   Historical Provider, MD  Insulin Glargine (TOUJEO SOLOSTAR) 300 UNIT/ML SOPN Inject 20 Units into the skin at bedtime. Patient taking differently: Inject 24 Units into the skin at bedtime.  12/21/15   Theodoro Grist, MD  Lanthanum Carbonate (FOSRENOL PO) Take 1 tablet by mouth 3 (three) times daily with meals. Reported on 01/06/2016    Historical Provider, MD  levothyroxine (SYNTHROID, LEVOTHROID) 25 MCG tablet Take 25 mcg by mouth daily before breakfast.  Historical Provider, MD  lovastatin (MEVACOR) 20 MG tablet Take 20 mg by mouth at bedtime.    Historical Provider, MD  metolazone (ZAROXOLYN) 5 MG tablet Take 5 mg by mouth daily as needed.    Historical Provider, MD  Multiple Vitamins-Minerals (MULTIVITAMIN WITH MINERALS) tablet Take 1 tablet by mouth daily.    Historical Provider, MD  omega-3 acid ethyl esters (LOVAZA) 1 G capsule Take 1,200 mg by mouth 2 (two) times daily.    Historical Provider, MD  ondansetron (ZOFRAN) 4  MG tablet Take 1 tablet (4 mg total) by mouth every 6 (six) hours as needed for nausea. 12/21/15   Theodoro Grist, MD  Peritoneal Dialysis Solutions (DIALYSIS SOLUTION 1.5% LOW-MG/LOW-CA) dianeal solution Inject 1,000 mLs into the peritoneum daily. 12/21/15   Theodoro Grist, MD  predniSONE (DELTASONE) 50 MG tablet Take 1 tablet (50 mg total) by mouth daily with breakfast. 04/28/16   Charline Bills Cuthriell, PA-C  sodium bicarbonate 650 MG tablet Take 1,300 mg by mouth 2 (two) times daily.    Historical Provider, MD  VITAMIN D, ERGOCALCIFEROL, PO Take 1 tablet by mouth daily.    Historical Provider, MD    Allergies Penicillins  No family history on file.  Social History Social History  Substance Use Topics  . Smoking status: Former Research scientist (life sciences)  . Smokeless tobacco: Never Used  . Alcohol use No     Review of Systems  Constitutional: No fever/chills Cardiovascular: no chest pain. Respiratory: no cough. No SOB. Gastrointestinal: No abdominal pain.  No nausea, no vomiting.  No diarrhea.  No constipation. Genitourinary: Negative for dysuria. No hematuria Musculoskeletal: Positive for left lower back pain radiating into hip. Skin: Negative for rash, abrasions, lacerations, ecchymosis. Neurological: Negative for headaches, focal weakness or numbness. 10-point ROS otherwise negative.  ____________________________________________   PHYSICAL EXAM:  VITAL SIGNS: ED Triage Vitals  Enc Vitals Group     BP 04/28/16 1239 (!) 193/87     Pulse Rate 04/28/16 1239 88     Resp 04/28/16 1239 18     Temp 04/28/16 1239 98.7 F (37.1 C)     Temp Source 04/28/16 1239 Oral     SpO2 04/28/16 1239 97 %     Weight 04/28/16 1235 148 lb (67.1 kg)     Height 04/28/16 1235 5' (1.524 m)     Head Circumference --      Peak Flow --      Pain Score 04/28/16 1236 7     Pain Loc --      Pain Edu? --      Excl. in [place]? --      Constitutional: Alert and oriented. Well appearing and in no acute distress. Eyes:  Conjunctivae are normal. PERRL. EOMI. Head: Atraumatic. Cardiovascular: Normal rate, regular rhythm. Normal S1 and S2.  Good peripheral circulation. Respiratory: Normal respiratory effort without tachypnea or retractions. Lungs CTAB. Good air entry to the bases with no decreased or absent breath sounds. Gastrointestinal: Bowel sounds 4 quadrants. Soft and nontender to palpation. No guarding or rigidity. No palpable masses. No distention. No CVA tenderness. Musculoskeletal: Full range of motion to all extremities. No gross deformities appreciated.No deformities noted to spine upon inspection. Full range of motion to spine. Patient is only tender to palpation in the left paraspinal muscle group. Patient is tender to palpation over the left sciatic notch. Equal leg strength. Dorsalis pedis pulse intact bilateral lower extremities. Sensation intact and equal lower extremities. Neurologic:  Normal speech and language. No gross  focal neurologic deficits are appreciated.  Skin:  Skin is warm, dry and intact. No rash noted. Psychiatric: Mood and affect are normal. Speech and behavior are normal. Patient exhibits appropriate insight and judgement.   ____________________________________________   LABS (all labs ordered are listed, but only abnormal results are displayed)  Labs Reviewed - No data to display ____________________________________________  EKG   ____________________________________________  RADIOLOGY   No results found.  ____________________________________________    PROCEDURES  Procedure(s) performed:    Procedures    Medications - No data to display   ____________________________________________   INITIAL IMPRESSION / ASSESSMENT AND PLAN / ED COURSE  Pertinent labs & imaging results that were available during my care of the patient were reviewed by me and considered in my medical decision making (see chart for details).  Review of the [place] CSRS was performed in  accordance of the [place] prior to dispensing any controlled drugs.  Clinical Course    Patient's diagnosis is consistent with Sciatica. Patient spends long hours sitting. Pain is distributed along the sciatic nerve. No indication for UTI or other complications. Patient does have a nephrologist. Due to patient's history of renal failure, patient will be placed on steroids for anti-inflammatory control. Patient will follow up with primary care or urology as needed. Patient is given ED precautions to return to the ED for any worsening or new symptoms.     ____________________________________________  FINAL CLINICAL IMPRESSION(S) / ED DIAGNOSES  Final diagnoses:  Sciatica of left side      NEW MEDICATIONS STARTED DURING THIS VISIT:  Discharge Medication List as of 04/28/2016  2:28 PM    START taking these medications   Details  predniSONE (DELTASONE) 50 MG tablet Take 1 tablet (50 mg total) by mouth daily with breakfast., Starting Fri 04/28/2016, Print            This chart was dictated using voice recognition software/Dragon. Despite best efforts to proofread, errors can occur which can change the meaning. Any change was purely unintentional.    Darletta Moll, PA-C 04/28/16 [place], MD 04/28/16 [phone]

## 2016-04-28 NOTE — ED Triage Notes (Signed)
States she is having lower back pain which radiates into left leg  Ambulates well   Denies any injury

## 2016-04-30 DIAGNOSIS — N186 End stage renal disease: Secondary | ICD-10-CM | POA: Diagnosis not present

## 2016-04-30 DIAGNOSIS — Z992 Dependence on renal dialysis: Secondary | ICD-10-CM | POA: Diagnosis not present

## 2016-05-01 DIAGNOSIS — N186 End stage renal disease: Secondary | ICD-10-CM | POA: Diagnosis not present

## 2016-05-01 DIAGNOSIS — Z992 Dependence on renal dialysis: Secondary | ICD-10-CM | POA: Diagnosis not present

## 2016-05-02 DIAGNOSIS — N186 End stage renal disease: Secondary | ICD-10-CM | POA: Diagnosis not present

## 2016-05-02 DIAGNOSIS — Z992 Dependence on renal dialysis: Secondary | ICD-10-CM | POA: Diagnosis not present

## 2016-05-03 DIAGNOSIS — N186 End stage renal disease: Secondary | ICD-10-CM | POA: Diagnosis not present

## 2016-05-03 DIAGNOSIS — Z992 Dependence on renal dialysis: Secondary | ICD-10-CM | POA: Diagnosis not present

## 2016-05-04 ENCOUNTER — Emergency Department: Payer: Commercial Managed Care - HMO

## 2016-05-04 ENCOUNTER — Emergency Department
Admission: EM | Admit: 2016-05-04 | Discharge: 2016-05-04 | Disposition: A | Discharge status: Home / Self Care | Payer: Commercial Managed Care - HMO | Attending: Emergency Medicine | Admitting: Emergency Medicine

## 2016-05-04 ENCOUNTER — Encounter: Payer: Self-pay | Admitting: Emergency Medicine

## 2016-05-04 DIAGNOSIS — Z992 Dependence on renal dialysis: Secondary | ICD-10-CM | POA: Diagnosis not present

## 2016-05-04 DIAGNOSIS — B029 Zoster without complications: Secondary | ICD-10-CM | POA: Diagnosis not present

## 2016-05-04 DIAGNOSIS — E1122 Type 2 diabetes mellitus with diabetic chronic kidney disease: Secondary | ICD-10-CM | POA: Diagnosis not present

## 2016-05-04 DIAGNOSIS — I129 Hypertensive chronic kidney disease with stage 1 through stage 4 chronic kidney disease, or unspecified chronic kidney disease: Secondary | ICD-10-CM | POA: Diagnosis not present

## 2016-05-04 DIAGNOSIS — R188 Other ascites: Secondary | ICD-10-CM | POA: Diagnosis not present

## 2016-05-04 DIAGNOSIS — Z794 Long term (current) use of insulin: Secondary | ICD-10-CM | POA: Insufficient documentation

## 2016-05-04 DIAGNOSIS — N189 Chronic kidney disease, unspecified: Secondary | ICD-10-CM | POA: Diagnosis not present

## 2016-05-04 DIAGNOSIS — M545 Low back pain: Secondary | ICD-10-CM | POA: Diagnosis present

## 2016-05-04 DIAGNOSIS — R102 Pelvic and perineal pain: Secondary | ICD-10-CM | POA: Diagnosis not present

## 2016-05-04 DIAGNOSIS — Z87891 Personal history of nicotine dependence: Secondary | ICD-10-CM | POA: Insufficient documentation

## 2016-05-04 DIAGNOSIS — Z7982 Long term (current) use of aspirin: Secondary | ICD-10-CM | POA: Insufficient documentation

## 2016-05-04 DIAGNOSIS — M5442 Lumbago with sciatica, left side: Secondary | ICD-10-CM | POA: Diagnosis not present

## 2016-05-04 DIAGNOSIS — M5432 Sciatica, left side: Secondary | ICD-10-CM

## 2016-05-04 DIAGNOSIS — N186 End stage renal disease: Secondary | ICD-10-CM | POA: Diagnosis not present

## 2016-05-04 LAB — COMPREHENSIVE METABOLIC PANEL
ALT: 16 U/L (ref 14–54)
AST: 13 U/L — ABNORMAL LOW (ref 15–41)
Albumin: 2.8 g/dL — ABNORMAL LOW (ref 3.5–5.0)
Alkaline Phosphatase: 48 U/L (ref 38–126)
Anion gap: 11 (ref 5–15)
BUN: 108 mg/dL — ABNORMAL HIGH (ref 6–20)
CO2: 22 mmol/L (ref 22–32)
Calcium: 6.6 mg/dL — ABNORMAL LOW (ref 8.9–10.3)
Chloride: 106 mmol/L (ref 101–111)
Creatinine, Ser: 11.88 mg/dL — ABNORMAL HIGH (ref 0.44–1.00)
GFR calc Af Amer: 3 mL/min — ABNORMAL LOW (ref >60)
GFR calc non Af Amer: 3 mL/min — ABNORMAL LOW (ref >60)
Glucose, Bld: 76 mg/dL (ref 65–99)
Potassium: 4.8 mmol/L (ref 3.5–5.1)
Sodium: 139 mmol/L (ref 135–145)
Total Bilirubin: 0.5 mg/dL (ref 0.3–1.2)
Total Protein: 5.4 g/dL — ABNORMAL LOW (ref 6.5–8.1)

## 2016-05-04 LAB — CBC WITH DIFFERENTIAL/PLATELET
BASOS PCT: 0 %
Basophils Absolute: 0 10*3/uL (ref 0–0.1)
EOS PCT: 1 %
Eosinophils Absolute: 0.1 10*3/uL (ref 0–0.7)
HEMATOCRIT: 35 % (ref 35.0–47.0)
Hemoglobin: 11.7 g/dL — ABNORMAL LOW (ref 12.0–16.0)
LYMPHS PCT: 8 %
Lymphs Abs: 0.9 10*3/uL — ABNORMAL LOW (ref 1.0–3.6)
MCH: 30.1 pg (ref 26.0–34.0)
MCHC: 33.4 g/dL (ref 32.0–36.0)
MCV: 90.1 fL (ref 80.0–100.0)
MONO ABS: 0.6 10*3/uL (ref 0.2–0.9)
MONOS PCT: 6 %
NEUTROS ABS: 9.1 10*3/uL — AB (ref 1.4–6.5)
Neutrophils Relative %: 85 %
Platelets: 143 10*3/uL — ABNORMAL LOW (ref 150–440)
RBC: 3.89 MIL/uL (ref 3.80–5.20)
RDW: 18.3 % — AB (ref 11.5–14.5)
WBC: 10.7 10*3/uL (ref 3.6–11.0)

## 2016-05-04 LAB — URINALYSIS COMPLETE WITH MICROSCOPIC (ARMC ONLY)
Bacteria, UA: NONE SEEN
Bilirubin Urine: NEGATIVE
Glucose, UA: 150 mg/dL — AB
Ketones, ur: NEGATIVE mg/dL
Leukocytes, UA: NEGATIVE
Nitrite: NEGATIVE
Protein, ur: >500 mg/dL — AB
Specific Gravity, Urine: 1.011 (ref 1.005–1.030)
pH: 5 (ref 5.0–8.0)

## 2016-05-04 LAB — GLUCOSE, CAPILLARY
GLUCOSE-CAPILLARY: 64 mg/dL — AB (ref 65–99)
Glucose-Capillary: 57 mg/dL — ABNORMAL LOW (ref 65–99)
Glucose-Capillary: 107 mg/dL — ABNORMAL HIGH (ref 65–99)

## 2016-05-04 MED ORDER — IOPAMIDOL (ISOVUE-300) INJECTION 61%
15.0000 mL | INTRAVENOUS | Status: AC
  Administered 2016-05-04 (×2): 15 mL via ORAL
  Filled 2016-05-04 (×2): qty 15

## 2016-05-04 MED ORDER — ONDANSETRON HCL 4 MG/2ML IJ SOLN
4.0000 mg | Freq: Once | INTRAMUSCULAR | Status: AC
  Administered 2016-05-04: 4 mg via INTRAVENOUS
  Filled 2016-05-04: qty 2

## 2016-05-04 MED ORDER — DOCUSATE SODIUM 100 MG PO CAPS: 100.0000 mg | ORAL_CAPSULE | Freq: Every day | ORAL | 2 refills | Status: DC | PRN

## 2016-05-04 MED ORDER — HYDROMORPHONE HCL 1 MG/ML IJ SOLN
0.5000 mg | Freq: Once | INTRAMUSCULAR | Status: AC
  Administered 2016-05-04: 0.5 mg via INTRAVENOUS
  Filled 2016-05-04: qty 1

## 2016-05-04 MED ORDER — OXYCODONE-ACETAMINOPHEN 5-325 MG PO TABS
1.0000 | ORAL_TABLET | Freq: Once | ORAL | Status: AC
  Administered 2016-05-04: 1 via ORAL
  Filled 2016-05-04: qty 1

## 2016-05-04 MED ORDER — OXYCODONE-ACETAMINOPHEN 5-325 MG PO TABS: 1.0000 | ORAL_TABLET | Freq: Four times a day (QID) | ORAL | 0 refills | Status: DC | PRN

## 2016-05-04 NOTE — ED Provider Notes (Signed)
[facility] Emergency Department Provider Note  ____________________________________________   I have reviewed the triage vital signs and the nursing notes.   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 Patient has had pain going around from her back towards her groin. Mostly, she placed immediate pain in the inguinal region on the left which is sensitive even to light touch. She states that her dialysis fluid has been slow coming out but not otherwise abnormal. She denies abdominal pain or fever. He does make urine, she has no urinary issues or complaints. He is on peritoneal dialysis. It has been there for almost a week. On further questioning, patient states that she has started to have a small rash in her inguinal region.      Past Medical History:  Diagnosis Date  . Anemia   . Chronic kidney disease   . Diabetes mellitus without complication ([place])   . Hypercholesterolemia   . Hypertension     Patient Active Problem List   Diagnosis Date Noted  . ESRD on dialysis ([place]) 12/21/2015  . Peritoneal dialysis status ([place]) 12/21/2015  . Leg swelling 12/21/2015  . Anemia of chronic disease 12/21/2015  . Diabetes mellitus ([place]) 12/21/2015  . Hyperkalemia 12/21/2015  . 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 Anemia in chronic renal disease 10/04/2015  . Proteinuria 05/25/2015    Past Surgical History:  Procedure Laterality Date  . ABDOMINAL HYSTERECTOMY    . CAPD INSERTION N/A 11/25/2015   Procedure: LAPAROSCOPIC INSERTION CONTINUOUS AMBULATORY PERITONEAL DIALYSIS  (CAPD) CATHETER;  Surgeon: Algernon Huxley, MD;  Location: [facility] ORS;  Service: Vascular;  Laterality: N/A;  . CAPD INSERTION N/A 01/06/2016   Procedure: LAPAROSCOPIC INSERTION CONTINUOUS AMBULATORY PERITONEAL DIALYSIS  (CAPD) CATHETER REVISION ;  Surgeon: Algernon Huxley, MD;  Location: [facility] ORS;  Service: Vascular;  Laterality:  N/A;  . EYE SURGERY    . TUBAL LIGATION      Prior to Admission medications   Medication Sig Start Date End Date Taking? Authorizing Provider  amLODipine (NORVASC) 5 MG tablet Take 5 mg by mouth daily.    Historical Provider, MD  aspirin EC 81 MG tablet Take 1 tablet (81 mg total) by mouth daily. Start taking after NOV 15 05/26/15   Harmeet Singh, MD  furosemide (LASIX) 40 MG tablet Take 1 tablet (40 mg total) by mouth 2 (two) times daily with breakfast and lunch. Take as needed for Leg swelling 05/26/15   Murlean Iba, MD  gabapentin (NEURONTIN) 300 MG capsule Take 300 mg by mouth 3 (three) times daily. Reported on 01/06/2016    Historical Provider, MD  gentamicin cream (GARAMYCIN) 0.1 % Apply 1 application topically daily. 12/21/15   Theodoro Grist, MD  HYDROcodone-acetaminophen (NORCO) 5-325 MG tablet Take 1 tablet by mouth every 6 (six) hours as needed for moderate pain. 01/06/16   Algernon Huxley, MD  insulin aspart (NOVOLOG FLEXPEN) 100 UNIT/ML FlexPen 4-8 Units 3 (three) times daily with meals.  09/13/15   Historical Provider, MD  Insulin Glargine (TOUJEO SOLOSTAR) 300 UNIT/ML SOPN Inject 20 Units into the skin at bedtime. Patient taking differently: Inject 24 Units into the skin at bedtime.  12/21/15   Theodoro Grist, MD  Lanthanum Carbonate (FOSRENOL PO) Take 1 tablet by mouth 3 (three) times daily with meals. Reported on 01/06/2016    Historical Provider, MD  levothyroxine (SYNTHROID, LEVOTHROID) 25 MCG tablet Take 25 mcg  by mouth daily before breakfast.    Historical Provider, MD  lovastatin (MEVACOR) 20 MG tablet Take 20 mg by mouth at bedtime.    Historical Provider, MD  metolazone (ZAROXOLYN) 5 MG tablet Take 5 mg by mouth daily as needed.    Historical Provider, MD  Multiple Vitamins-Minerals (MULTIVITAMIN WITH MINERALS) tablet Take 1 tablet by mouth daily.    Historical Provider, MD  omega-3 acid ethyl esters (LOVAZA) 1 G capsule Take 1,200 mg by mouth 2 (two) times daily.    Historical  Provider, MD  ondansetron (ZOFRAN) 4 MG tablet Take 1 tablet (4 mg total) by mouth every 6 (six) hours as needed for nausea. 12/21/15   Theodoro Grist, MD  Peritoneal Dialysis Solutions (DIALYSIS SOLUTION 1.5% LOW-MG/LOW-CA) dianeal solution Inject 1,000 mLs into the peritoneum daily. 12/21/15   Theodoro Grist, MD  predniSONE (DELTASONE) 50 MG tablet Take 1 tablet (50 mg total) by mouth daily with breakfast. 04/28/16   Charline Bills Cuthriell, PA-C  sodium bicarbonate 650 MG tablet Take 1,300 mg by mouth 2 (two) times daily.    Historical Provider, MD  VITAMIN D, ERGOCALCIFEROL, PO Take 1 tablet by mouth daily.    Historical Provider, MD    Allergies Penicillins  No family history on file.  Social History Social History  Substance Use Topics  . Smoking status: Former Research scientist (life sciences)  . Smokeless tobacco: Never Used  . Alcohol use No    Review of Systems Constitutional: No fever/chills Eyes: No visual changes. ENT: No sore throat. No stiff neck no neck pain Cardiovascular: Denies chest pain. Respiratory: Denies shortness of breath. Gastrointestinal:   no vomiting.  No diarrhea.  No constipation. Genitourinary: Negative for dysuria. Musculoskeletal: Negative lower extremity swelling Skin: Positive for rash. Neurological: Negative for severe headaches, focal weakness or numbness. 10-point ROS otherwise negative.  ____________________________________________   PHYSICAL EXAM:  VITAL SIGNS: ED Triage Vitals  Enc Vitals Group     BP 05/04/16 1042 (!) 176/66     Pulse Rate 05/04/16 1042 81     Resp 05/04/16 1042 18     Temp 05/04/16 1042 97.6 F (36.4 C)     Temp Source 05/04/16 1042 Oral     SpO2 05/04/16 1042 98 %     Weight 05/04/16 1033 148 lb (67.1 kg)     Height 05/04/16 1033 5' (1.524 m)     Head Circumference --      Peak Flow --      Pain Score 05/04/16 1034 7     Pain Loc --      Pain Edu? --      Excl. in [place]? --     Constitutional: Alert and oriented. Well appearing and  in no acute distress. Eyes: Conjunctivae are normal. PERRL. EOMI. Head: Atraumatic. Nose: No congestion/rhinnorhea. Mouth/Throat: Mucous membranes are moist.  Oropharynx non-erythematous. Neck: No stridor.   Nontender with no meningismus Cardiovascular: Normal rate, regular rhythm. Grossly normal heart sounds.  Good peripheral circulation. Respiratory: Normal respiratory effort.  No retractions. Lungs CTAB. Abdominal: Soft and nontender. No distention. No guarding no rebound Back:  There is no focal tenderness or step off.  there is no midline tenderness there are no lesions noted. there is no CVA tenderness Musculoskeletal: No lower extremity tenderness, no upper extremity tenderness. No joint effusions, no DVT signs strong distal pulses no edema Neurologic:  Normal speech and language. No gross focal neurologic deficits are appreciated.  Skin:  Skin is warm, dry and intact. There is,  in the left inguinal region, female nurse chaperone present, obvious herpetic lesions consistent with zoster Psychiatric: Mood and affect are normal. Speech and behavior are normal.  ____________________________________________   LABS (all labs ordered are listed, but only abnormal results are displayed)  Labs Reviewed  COMPREHENSIVE METABOLIC PANEL - Abnormal; Notable for the following:       Result Value   BUN 108 (*)    Creatinine, Ser 11.88 (*)    Calcium 6.6 (*)    Total Protein 5.4 (*)    Albumin 2.8 (*)    AST 13 (*)    GFR calc non Af Amer 3 (*)    GFR calc Af Amer 3 (*)    All other components within normal limits  CBC WITH DIFFERENTIAL/PLATELET - Abnormal; Notable for the following:    Hemoglobin 11.7 (*)    RDW 18.3 (*)    Platelets 143 (*)    Neutro Abs 9.1 (*)    Lymphs Abs 0.9 (*)    All other components within normal limits  GLUCOSE, CAPILLARY - Abnormal; Notable for the following:    Glucose-Capillary 57 (*)    All other components within normal limits  GLUCOSE, CAPILLARY -  Abnormal; Notable for the following:    Glucose-Capillary 107 (*)    All other components within normal limits  URINALYSIS COMPLETEWITH MICROSCOPIC ([facility] ONLY) - Abnormal; Notable for the following:    Color, Urine STRAW (*)    APPearance CLEAR (*)    Glucose, UA 150 (*)    Hgb urine dipstick 1+ (*)    Protein, ur >500 (*)    Squamous Epithelial / LPF 0-5 (*)    All other components within normal limits  URINE CULTURE  CBG MONITORING, ED   ____________________________________________  EKG  I personally interpreted any EKGs ordered by me or triage  ____________________________________________  RADIOLOGY  I reviewed any imaging ordered by me or triage that were performed during my shift and, if possible, patient and/or family made aware of any abnormal findings. ____________________________________________   PROCEDURES  Procedure(s) performed: None  Procedures  Critical Care performed: None  ____________________________________________   INITIAL IMPRESSION / ASSESSMENT AND PLAN / ED COURSE  Pertinent labs & imaging results that were available during my care of the patient were reviewed by me and considered in my medical decision making (see chart for details).  Patient has what appears to be herpes zoster which is certainly consistent with her symptoms of pain even to light touch, radiating around in a dermatomal distribution from her back, the pain preceded the rash. The rash is been there for possibly a day or 2. Patient is now more than 72 hours into her symptoms, has already had a course of steroids, and is on peritoneal dialysis which would make dosing acyclovir hard. Given that she is more than 72 hours in, I think at this juncture pain control is the best option. There is no evidence of systemic illness.  Clinical Course   ____________________________________________   FINAL CLINICAL IMPRESSION(S) / ED DIAGNOSES  Final diagnoses:  Sciatica of left side   Pelvic pain in female      This chart was dictated using voice recognition software.  Despite best efforts to proofread,  errors can occur which can change meaning.      Schuyler Amor, MD 05/04/16 1524

## 2016-05-04 NOTE — ED Provider Notes (Signed)
[facility] Emergency Department Provider Note   ____________________________________________   None    (approximate)  I have reviewed the triage vital signs and the nursing notes.   HISTORY  Chief Complaint Back Pain    HPI Bethany Lee is a 65 y.o. female who presents for evaluation of continued low back pelvic and flank pain. Patient is to have a peritoneal dialysis patient who states her pain is progressively getting worse. She reports being seen here 6 days ago for the same with no relief and current medications. Patient states that the pains actually gotten worse. Husband reports that they've been having problems with her dialysis at the fluids been going into nothings been coming out. States pain is progressively getting worse as a 10 over 10 at this time. Patient unable to sleep secondary to pain. Medical history is significant For chronic kidney disease diabetes with insulin use hypertension   Past Medical History:  Diagnosis Date  . Anemia   . Chronic kidney disease   . Diabetes mellitus without complication ([place])   . Hypercholesterolemia   . Hypertension     Patient Active Problem List   Diagnosis Date Noted  . ESRD on dialysis ([place]) 12/21/2015  . Peritoneal dialysis status ([place]) 12/21/2015  . Leg swelling 12/21/2015  . Anemia of chronic disease 12/21/2015  . Diabetes mellitus ([place]) 12/21/2015  . Hyperkalemia 12/21/2015  . 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 Anemia in chronic renal disease 10/04/2015  . Proteinuria 05/25/2015    Past Surgical History:  Procedure Laterality Date  . ABDOMINAL HYSTERECTOMY    . CAPD INSERTION N/A 11/25/2015   Procedure: LAPAROSCOPIC INSERTION CONTINUOUS AMBULATORY PERITONEAL DIALYSIS  (CAPD) CATHETER;  Surgeon: Algernon Huxley, MD;  Location: [facility] ORS;  Service: Vascular;  Laterality: N/A;  . CAPD INSERTION N/A 01/06/2016   Procedure: LAPAROSCOPIC INSERTION CONTINUOUS AMBULATORY PERITONEAL DIALYSIS   (CAPD) CATHETER REVISION ;  Surgeon: Algernon Huxley, MD;  Location: [facility] ORS;  Service: Vascular;  Laterality: N/A;  . EYE SURGERY    . TUBAL LIGATION      Prior to Admission medications   Medication Sig Start Date End Date Taking? Authorizing Provider  amLODipine (NORVASC) 5 MG tablet Take 5 mg by mouth daily.    Historical Provider, MD  aspirin EC 81 MG tablet Take 1 tablet (81 mg total) by mouth daily. Start taking after NOV 15 05/26/15   Harmeet Singh, MD  furosemide (LASIX) 40 MG tablet Take 1 tablet (40 mg total) by mouth 2 (two) times daily with breakfast and lunch. Take as needed for Leg swelling 05/26/15   Murlean Iba, MD  gabapentin (NEURONTIN) 300 MG capsule Take 300 mg by mouth 3 (three) times daily. Reported on 01/06/2016    Historical Provider, MD  gentamicin cream (GARAMYCIN) 0.1 % Apply 1 application topically daily. 12/21/15   Theodoro Grist, MD  HYDROcodone-acetaminophen (NORCO) 5-325 MG tablet Take 1 tablet by mouth every 6 (six) hours as needed for moderate pain. 01/06/16   Algernon Huxley, MD  insulin aspart (NOVOLOG FLEXPEN) 100 UNIT/ML FlexPen 4-8 Units 3 (three) times daily with meals.  09/13/15   Historical Provider, MD  Insulin Glargine (TOUJEO SOLOSTAR) 300 UNIT/ML SOPN Inject 20 Units into the skin at bedtime. Patient taking differently: Inject 24 Units into the skin at bedtime.  12/21/15   Theodoro Grist, MD  Lanthanum Carbonate (FOSRENOL PO) Take 1 tablet by mouth 3 (three) times daily with meals. Reported on 01/06/2016  Historical Provider, MD  levothyroxine (SYNTHROID, LEVOTHROID) 25 MCG tablet Take 25 mcg by mouth daily before breakfast.    Historical Provider, MD  lovastatin (MEVACOR) 20 MG tablet Take 20 mg by mouth at bedtime.    Historical Provider, MD  metolazone (ZAROXOLYN) 5 MG tablet Take 5 mg by mouth daily as needed.    Historical Provider, MD  Multiple Vitamins-Minerals (MULTIVITAMIN WITH MINERALS) tablet Take 1 tablet by mouth daily.    Historical Provider, MD    omega-3 acid ethyl esters (LOVAZA) 1 G capsule Take 1,200 mg by mouth 2 (two) times daily.    Historical Provider, MD  ondansetron (ZOFRAN) 4 MG tablet Take 1 tablet (4 mg total) by mouth every 6 (six) hours as needed for nausea. 12/21/15   Theodoro Grist, MD  Peritoneal Dialysis Solutions (DIALYSIS SOLUTION 1.5% LOW-MG/LOW-CA) dianeal solution Inject 1,000 mLs into the peritoneum daily. 12/21/15   Theodoro Grist, MD  predniSONE (DELTASONE) 50 MG tablet Take 1 tablet (50 mg total) by mouth daily with breakfast. 04/28/16   Charline Bills Cuthriell, PA-C  sodium bicarbonate 650 MG tablet Take 1,300 mg by mouth 2 (two) times daily.    Historical Provider, MD  VITAMIN D, ERGOCALCIFEROL, PO Take 1 tablet by mouth daily.    Historical Provider, MD    Allergies Penicillins  No family history on file.  Social History Social History  Substance Use Topics  . Smoking status: Former Research scientist (life sciences)  . Smokeless tobacco: Never Used  . Alcohol use No    Review of Systems Constitutional: No fever/chills Eyes: No visual changes. ENT: No sore throat. Cardiovascular: Denies chest pain. Respiratory: Denies shortness of breath. Gastrointestinal: Positive abdominal pain.  No nausea, no vomiting.  No diarrhea.  No constipation. Genitourinary: Negative for dysuria. Musculoskeletal: Positive for low back pain. Skin: Negative for rash. Neurological: Negative for headaches, focal weakness or numbness.  10-point ROS otherwise negative.  ____________________________________________   PHYSICAL EXAM:  VITAL SIGNS: ED Triage Vitals  Enc Vitals Group     BP 05/04/16 1042 (!) 176/66     Pulse Rate 05/04/16 1042 81     Resp 05/04/16 1042 18     Temp 05/04/16 1042 97.6 F (36.4 C)     Temp Source 05/04/16 1042 Oral     SpO2 05/04/16 1042 98 %     Weight 05/04/16 1033 148 lb (67.1 kg)     Height 05/04/16 1033 5' (1.524 m)     Head Circumference --      Peak Flow --      Pain Score 05/04/16 1034 7     Pain Loc --       Pain Edu? --      Excl. in [place]? --     Constitutional: Alert and oriented. Well appearing and in no acute distress. Eyes: Conjunctivae are normal. PERRL. EOMI. Head: Atraumatic. Nose: No congestion/rhinnorhea. Mouth/Throat: Mucous membranes are moist.  Oropharynx non-erythematous. Neck: No stridor. Supple.  Cardiovascular: Normal rate, regular rhythm. Grossly normal heart sounds.  Good peripheral circulation. Respiratory: Normal respiratory effort.  No retractions. Lungs CTAB. Gastrointestinal: Full with tenderness.Marland Kitchen Positive distention. No abdominal bruits. No CVA tenderness. Musculoskeletal: Positive left CVAT with left hip and pelvic tenderness. Straight leg raise negative bilaterally. Distally neurovascularly intact. Neurologic:  Normal speech and language. No gross focal neurologic deficits are appreciated. Gait not tested. Skin:  Skin is warm, dry and intact. No rash noted. Psychiatric: Mood and affect are normal. Speech and behavior are normal.  ____________________________________________  LABS (all labs ordered are listed, but only abnormal results are displayed)  Labs Reviewed  COMPREHENSIVE METABOLIC PANEL - Abnormal; Notable for the following:       Result Value   BUN 108 (*)    Creatinine, Ser 11.88 (*)    Calcium 6.6 (*)    Total Protein 5.4 (*)    Albumin 2.8 (*)    AST 13 (*)    GFR calc non Af Amer 3 (*)    GFR calc Af Amer 3 (*)    All other components within normal limits  CBC WITH DIFFERENTIAL/PLATELET - Abnormal; Notable for the following:    Hemoglobin 11.7 (*)    RDW 18.3 (*)    Platelets 143 (*)    Neutro Abs 9.1 (*)    Lymphs Abs 0.9 (*)    All other components within normal limits  GLUCOSE, CAPILLARY - Abnormal; Notable for the following:    Glucose-Capillary 57 (*)    All other components within normal limits  GLUCOSE, CAPILLARY - Abnormal; Notable for the following:    Glucose-Capillary 107 (*)    All other components within normal  limits  CBG MONITORING, ED   ____________________________________________  EKG   ____________________________________________  RADIOLOGY  Report still pending at the time. ____________________________________________   PROCEDURES  Procedure(s) performed: None  Procedures  Critical Care performed: No  ____________________________________________   INITIAL IMPRESSION / ASSESSMENT AND PLAN / ED COURSE  Pertinent labs & imaging results that were available during my care of the patient were reviewed by me and considered in my medical decision making (see chart for details).  Transfer patient to remain ED for follow-up and monitoring. Report given to Dr. Audelia Acton for continuity of care.  Clinical Course     ____________________________________________   FINAL CLINICAL IMPRESSION(S) / ED DIAGNOSES  Final diagnoses:  Sciatica of left side  Pelvic pain in female      NEW MEDICATIONS STARTED DURING THIS VISIT:  New Prescriptions   No medications on file     Note:  This document was prepared using Dragon voice recognition software and may include unintentional dictation errors.   Arlyss Repress, PA-C 05/04/16 [place], MD 05/04/16 2031

## 2016-05-04 NOTE — ED Notes (Signed)
CT notified that pt has completed oral contrast.

## 2016-05-04 NOTE — ED Notes (Signed)
Pt in via triage with complaints of lower back pain radiating down into left side x 5-6 days.  Reports being seen here last week and provided medications which she does not recall the names of, has finished taking medication but with no relief.  Pt drowsy, denies taking anything for pain today, states she was unable to sleep much last night due to the pain.  Pt denies following up with anyone, pt husband states, "who are we supposed to follow up with, a primary care doctor for back pain, what are they going to do?"  When asked about who they were instructed to follow up with, pt husband replies, "you have a computer, don't you."  Husband seems very dissatisfied and disgruntled with our services.

## 2016-05-04 NOTE — ED Notes (Signed)
Pt provided 2 cups apple juice

## 2016-05-04 NOTE — ED Triage Notes (Signed)
Reports lower back pain rad down left leg.  Reports being dx with siatica a week ago and took prednisone for 3 days and it felt better so she stopped taking the prednisone, now pain has returned.

## 2016-05-05 DIAGNOSIS — Z992 Dependence on renal dialysis: Secondary | ICD-10-CM | POA: Diagnosis not present

## 2016-05-05 DIAGNOSIS — N186 End stage renal disease: Secondary | ICD-10-CM | POA: Diagnosis not present

## 2016-05-05 LAB — URINE CULTURE: Culture: <10000 — AB

## 2016-05-07 DIAGNOSIS — Z992 Dependence on renal dialysis: Secondary | ICD-10-CM | POA: Diagnosis not present

## 2016-05-07 DIAGNOSIS — N186 End stage renal disease: Secondary | ICD-10-CM | POA: Diagnosis not present

## 2016-05-08 DIAGNOSIS — E039 Hypothyroidism, unspecified: Secondary | ICD-10-CM | POA: Diagnosis not present

## 2016-05-08 DIAGNOSIS — N186 End stage renal disease: Secondary | ICD-10-CM | POA: Diagnosis not present

## 2016-05-08 DIAGNOSIS — M545 Low back pain: Secondary | ICD-10-CM | POA: Diagnosis not present

## 2016-05-08 DIAGNOSIS — Z992 Dependence on renal dialysis: Secondary | ICD-10-CM | POA: Diagnosis not present

## 2016-05-08 DIAGNOSIS — I10 Essential (primary) hypertension: Secondary | ICD-10-CM | POA: Diagnosis not present

## 2016-05-08 DIAGNOSIS — Z0000 Encounter for general adult medical examination without abnormal findings: Secondary | ICD-10-CM | POA: Diagnosis not present

## 2016-05-08 DIAGNOSIS — G8929 Other chronic pain: Secondary | ICD-10-CM | POA: Diagnosis not present

## 2016-05-08 DIAGNOSIS — E1122 Type 2 diabetes mellitus with diabetic chronic kidney disease: Secondary | ICD-10-CM | POA: Diagnosis not present

## 2016-05-08 DIAGNOSIS — B029 Zoster without complications: Secondary | ICD-10-CM | POA: Diagnosis not present

## 2016-05-09 DIAGNOSIS — Z992 Dependence on renal dialysis: Secondary | ICD-10-CM | POA: Diagnosis not present

## 2016-05-09 DIAGNOSIS — N186 End stage renal disease: Secondary | ICD-10-CM | POA: Diagnosis not present

## 2016-05-10 DIAGNOSIS — Z992 Dependence on renal dialysis: Secondary | ICD-10-CM | POA: Diagnosis not present

## 2016-05-10 DIAGNOSIS — N186 End stage renal disease: Secondary | ICD-10-CM | POA: Diagnosis not present

## 2016-05-11 DIAGNOSIS — Z992 Dependence on renal dialysis: Secondary | ICD-10-CM | POA: Diagnosis not present

## 2016-05-11 DIAGNOSIS — N186 End stage renal disease: Secondary | ICD-10-CM | POA: Diagnosis not present

## 2016-05-12 DIAGNOSIS — Z992 Dependence on renal dialysis: Secondary | ICD-10-CM | POA: Diagnosis not present

## 2016-05-12 DIAGNOSIS — N186 End stage renal disease: Secondary | ICD-10-CM | POA: Diagnosis not present

## 2016-05-14 DIAGNOSIS — N186 End stage renal disease: Secondary | ICD-10-CM | POA: Diagnosis not present

## 2016-05-14 DIAGNOSIS — Z992 Dependence on renal dialysis: Secondary | ICD-10-CM | POA: Diagnosis not present

## 2016-05-15 DIAGNOSIS — N186 End stage renal disease: Secondary | ICD-10-CM | POA: Diagnosis not present

## 2016-05-15 DIAGNOSIS — Z992 Dependence on renal dialysis: Secondary | ICD-10-CM | POA: Diagnosis not present

## 2016-05-16 DIAGNOSIS — Z992 Dependence on renal dialysis: Secondary | ICD-10-CM | POA: Diagnosis not present

## 2016-05-16 DIAGNOSIS — H25041 Posterior subcapsular polar age-related cataract, right eye: Secondary | ICD-10-CM | POA: Diagnosis not present

## 2016-05-16 DIAGNOSIS — N186 End stage renal disease: Secondary | ICD-10-CM | POA: Diagnosis not present

## 2016-05-17 DIAGNOSIS — N186 End stage renal disease: Secondary | ICD-10-CM | POA: Diagnosis not present

## 2016-05-17 DIAGNOSIS — Z992 Dependence on renal dialysis: Secondary | ICD-10-CM | POA: Diagnosis not present

## 2016-05-17 DIAGNOSIS — E113513 Type 2 diabetes mellitus with proliferative diabetic retinopathy with macular edema, bilateral: Secondary | ICD-10-CM | POA: Diagnosis not present

## 2016-05-18 ENCOUNTER — Encounter: Payer: Self-pay | Admitting: *Deleted

## 2016-05-18 DIAGNOSIS — Z992 Dependence on renal dialysis: Secondary | ICD-10-CM | POA: Diagnosis not present

## 2016-05-18 DIAGNOSIS — N186 End stage renal disease: Secondary | ICD-10-CM | POA: Diagnosis not present

## 2016-05-19 DIAGNOSIS — N186 End stage renal disease: Secondary | ICD-10-CM | POA: Diagnosis not present

## 2016-05-19 DIAGNOSIS — Z992 Dependence on renal dialysis: Secondary | ICD-10-CM | POA: Diagnosis not present

## 2016-05-21 DIAGNOSIS — Z992 Dependence on renal dialysis: Secondary | ICD-10-CM | POA: Diagnosis not present

## 2016-05-21 DIAGNOSIS — N186 End stage renal disease: Secondary | ICD-10-CM | POA: Diagnosis not present

## 2016-05-22 DIAGNOSIS — Z992 Dependence on renal dialysis: Secondary | ICD-10-CM | POA: Diagnosis not present

## 2016-05-22 DIAGNOSIS — N186 End stage renal disease: Secondary | ICD-10-CM | POA: Diagnosis not present

## 2016-05-23 DIAGNOSIS — Z992 Dependence on renal dialysis: Secondary | ICD-10-CM | POA: Diagnosis not present

## 2016-05-23 DIAGNOSIS — N186 End stage renal disease: Secondary | ICD-10-CM | POA: Diagnosis not present

## 2016-05-24 DIAGNOSIS — Z992 Dependence on renal dialysis: Secondary | ICD-10-CM | POA: Diagnosis not present

## 2016-05-24 DIAGNOSIS — N186 End stage renal disease: Secondary | ICD-10-CM | POA: Diagnosis not present

## 2016-05-25 ENCOUNTER — Ambulatory Visit
Admission: RE | Admit: 2016-05-25 | Discharge: 2016-05-25 | Disposition: A | Discharge status: Home / Self Care | Payer: Commercial Managed Care - HMO | Source: Ambulatory Visit | Attending: Ophthalmology | Admitting: Ophthalmology

## 2016-05-25 ENCOUNTER — Encounter: Payer: Self-pay | Admitting: *Deleted

## 2016-05-25 ENCOUNTER — Encounter
Admission: RE | Disposition: A | Discharge status: Home / Self Care | Payer: Self-pay | Source: Ambulatory Visit | Attending: Ophthalmology

## 2016-05-25 ENCOUNTER — Ambulatory Visit: Payer: Commercial Managed Care - HMO | Admitting: Certified Registered"

## 2016-05-25 DIAGNOSIS — J449 Chronic obstructive pulmonary disease, unspecified: Secondary | ICD-10-CM | POA: Insufficient documentation

## 2016-05-25 DIAGNOSIS — E039 Hypothyroidism, unspecified: Secondary | ICD-10-CM | POA: Insufficient documentation

## 2016-05-25 DIAGNOSIS — G4730 Sleep apnea, unspecified: Secondary | ICD-10-CM | POA: Insufficient documentation

## 2016-05-25 DIAGNOSIS — Z992 Dependence on renal dialysis: Secondary | ICD-10-CM | POA: Diagnosis not present

## 2016-05-25 DIAGNOSIS — D649 Anemia, unspecified: Secondary | ICD-10-CM | POA: Diagnosis not present

## 2016-05-25 DIAGNOSIS — E1136 Type 2 diabetes mellitus with diabetic cataract: Secondary | ICD-10-CM | POA: Insufficient documentation

## 2016-05-25 DIAGNOSIS — E669 Obesity, unspecified: Secondary | ICD-10-CM | POA: Diagnosis not present

## 2016-05-25 DIAGNOSIS — Z79899 Other long term (current) drug therapy: Secondary | ICD-10-CM | POA: Diagnosis not present

## 2016-05-25 DIAGNOSIS — Z794 Long term (current) use of insulin: Secondary | ICD-10-CM | POA: Diagnosis not present

## 2016-05-25 DIAGNOSIS — Z87891 Personal history of nicotine dependence: Secondary | ICD-10-CM | POA: Diagnosis not present

## 2016-05-25 DIAGNOSIS — N186 End stage renal disease: Secondary | ICD-10-CM | POA: Insufficient documentation

## 2016-05-25 DIAGNOSIS — Z6827 Body mass index (BMI) 27.0-27.9, adult: Secondary | ICD-10-CM | POA: Diagnosis not present

## 2016-05-25 DIAGNOSIS — I120 Hypertensive chronic kidney disease with stage 5 chronic kidney disease or end stage renal disease: Secondary | ICD-10-CM | POA: Diagnosis not present

## 2016-05-25 DIAGNOSIS — E7800 Pure hypercholesterolemia, unspecified: Secondary | ICD-10-CM | POA: Diagnosis not present

## 2016-05-25 DIAGNOSIS — H2511 Age-related nuclear cataract, right eye: Secondary | ICD-10-CM | POA: Diagnosis not present

## 2016-05-25 DIAGNOSIS — G629 Polyneuropathy, unspecified: Secondary | ICD-10-CM | POA: Insufficient documentation

## 2016-05-25 DIAGNOSIS — H25041 Posterior subcapsular polar age-related cataract, right eye: Secondary | ICD-10-CM | POA: Diagnosis not present

## 2016-05-25 HISTORY — DX: Edema, unspecified: R60.9

## 2016-05-25 HISTORY — DX: Adverse effect of unspecified anesthetic, initial encounter: T41.45XA

## 2016-05-25 HISTORY — DX: Nausea with vomiting, unspecified: R11.2

## 2016-05-25 HISTORY — DX: Hypothyroidism, unspecified: E03.9

## 2016-05-25 HISTORY — DX: Dyspnea, unspecified: R06.00

## 2016-05-25 HISTORY — DX: Other complications of anesthesia, initial encounter: T88.59XA

## 2016-05-25 HISTORY — DX: Polyneuropathy, unspecified: G62.9

## 2016-05-25 HISTORY — DX: Tremor, unspecified: R25.1

## 2016-05-25 HISTORY — DX: Other specified postprocedural states: Z98.890

## 2016-05-25 HISTORY — DX: Zoster without complications: B02.9

## 2016-05-25 HISTORY — PX: CATARACT EXTRACTION W/PHACO: SHX586

## 2016-05-25 LAB — GLUCOSE, CAPILLARY: Glucose-Capillary: 95 mg/dL (ref 65–99)

## 2016-05-25 LAB — POCT I-STAT 4, (NA,K, GLUC, HGB,HCT)
GLUCOSE: 104 mg/dL — AB (ref 65–99)
HEMATOCRIT: 31 % — AB (ref 36.0–46.0)
Hemoglobin: 10.5 g/dL — ABNORMAL LOW (ref 12.0–15.0)
Potassium: 5.2 mmol/L — ABNORMAL HIGH (ref 3.5–5.1)
Sodium: 140 mmol/L (ref 135–145)

## 2016-05-25 SURGERY — PHACOEMULSIFICATION, CATARACT, WITH IOL INSERTION
Anesthesia: Monitor Anesthesia Care | Site: Eye | Laterality: Right | Wound class: Clean

## 2016-05-25 MED ORDER — LIDOCAINE HCL (PF) 4 % IJ SOLN
INTRAOCULAR | Status: DC | PRN
  Administered 2016-05-25: 4 mL via OPHTHALMIC

## 2016-05-25 MED ORDER — SODIUM HYALURONATE 23 MG/ML IO SOLN
INTRAOCULAR | Status: AC
  Filled 2016-05-25: qty 0.6

## 2016-05-25 MED ORDER — ARMC OPHTHALMIC DILATING DROPS
OPHTHALMIC | Status: AC
  Filled 2016-05-25: qty 0.4

## 2016-05-25 MED ORDER — ARMC OPHTHALMIC DILATING DROPS
1.0000 "application " | OPHTHALMIC | Status: DC
  Administered 2016-05-25: 1 via OPHTHALMIC

## 2016-05-25 MED ORDER — SODIUM HYALURONATE 23 MG/ML IO SOLN
INTRAOCULAR | Status: DC | PRN
  Administered 2016-05-25: 0.6 mL via INTRAOCULAR

## 2016-05-25 MED ORDER — MOXIFLOXACIN HCL 0.5 % OP SOLN
OPHTHALMIC | Status: AC
  Filled 2016-05-25: qty 3

## 2016-05-25 MED ORDER — SODIUM HYALURONATE 10 MG/ML IO SOLN
INTRAOCULAR | Status: DC | PRN
  Administered 2016-05-25: 0.85 mL via INTRAOCULAR

## 2016-05-25 MED ORDER — POVIDONE-IODINE 5 % OP SOLN
OPHTHALMIC | Status: AC
  Filled 2016-05-25: qty 30

## 2016-05-25 MED ORDER — MIDAZOLAM HCL 2 MG/2ML IJ SOLN
INTRAMUSCULAR | Status: DC | PRN
  Administered 2016-05-25 (×2): 1 mg via INTRAVENOUS

## 2016-05-25 MED ORDER — MOXIFLOXACIN HCL 0.5 % OP SOLN: 1.0000 [drp] | OPHTHALMIC | Status: DC | PRN

## 2016-05-25 MED ORDER — EPINEPHRINE PF 1 MG/ML IJ SOLN
INTRAMUSCULAR | Status: AC
  Filled 2016-05-25: qty 2

## 2016-05-25 MED ORDER — SODIUM CHLORIDE 0.9 % IV SOLN
INTRAVENOUS | Status: DC
  Administered 2016-05-25: 06:00:00 via INTRAVENOUS

## 2016-05-25 MED ORDER — LABETALOL HCL 5 MG/ML IV SOLN
INTRAVENOUS | Status: DC | PRN
  Administered 2016-05-25: 10 mg via INTRAVENOUS

## 2016-05-25 MED ORDER — MOXIFLOXACIN HCL 0.5 % OP SOLN
OPHTHALMIC | Status: DC | PRN
  Administered 2016-05-25: 3 [drp] via OPHTHALMIC

## 2016-05-25 MED ORDER — EPINEPHRINE PF 1 MG/ML IJ SOLN
INTRAOCULAR | Status: DC | PRN
  Administered 2016-05-25: 200 mL via OPHTHALMIC

## 2016-05-25 MED ORDER — LIDOCAINE HCL (PF) 4 % IJ SOLN
INTRAMUSCULAR | Status: AC
  Filled 2016-05-25: qty 5

## 2016-05-25 MED ORDER — SODIUM HYALURONATE 10 MG/ML IO SOLN
INTRAOCULAR | Status: AC
  Filled 2016-05-25: qty 0.85

## 2016-05-25 SURGICAL SUPPLY — 22 items
CANNULA ANT/CHMB 27GA (MISCELLANEOUS) ×4 IMPLANT
CUP MEDICINE 2OZ PLAST GRAD ST (MISCELLANEOUS) ×2 IMPLANT
DISSECTOR HYDRO NUCLEUS 50X22 (MISCELLANEOUS) ×2 IMPLANT
GLOVE BIO SURGEON STRL SZ8 (GLOVE) ×2 IMPLANT
GLOVE BIOGEL M 6.5 STRL (GLOVE) ×2 IMPLANT
GLOVE SURG LX 7.5 STRW (GLOVE) ×1
GLOVE SURG LX STRL 7.5 STRW (GLOVE) ×1 IMPLANT
GOWN STRL REUS W/ TWL LRG LVL3 (GOWN DISPOSABLE) ×2 IMPLANT
GOWN STRL REUS W/TWL LRG LVL3 (GOWN DISPOSABLE) ×2
LENS IOL TECNIS ITEC 25.5 (Intraocular Lens) ×2 IMPLANT
PACK CATARACT (MISCELLANEOUS) ×2 IMPLANT
PACK CATARACT BRASINGTON LX (MISCELLANEOUS) ×2 IMPLANT
PACK EYE AFTER SURG (MISCELLANEOUS) ×2 IMPLANT
SOL BSS BAG (MISCELLANEOUS) ×2
SOL PREP PVP 2OZ (MISCELLANEOUS) ×2
SOLUTION BSS BAG (MISCELLANEOUS) ×1 IMPLANT
SOLUTION PREP PVP 2OZ (MISCELLANEOUS) ×1 IMPLANT
SYR 3ML LL SCALE MARK (SYRINGE) ×4 IMPLANT
SYR 5ML LL (SYRINGE) ×2 IMPLANT
SYR TB 1ML 27GX1/2 LL (SYRINGE) ×2 IMPLANT
WATER STERILE IRR 250ML POUR (IV SOLUTION) ×2 IMPLANT
WIPE NON LINTING 3.25X3.25 (MISCELLANEOUS) ×2 IMPLANT

## 2016-05-25 NOTE — Anesthesia Postprocedure Evaluation (Signed)
Anesthesia Post Note  Patient: Bethany Lee  Procedure(s) Performed: Procedure(s) (LRB): CATARACT EXTRACTION PHACO AND INTRAOCULAR LENS PLACEMENT (IOC) (Right)  Patient location during evaluation: Short Stay Anesthesia Type: MAC Level of consciousness: awake Pain management: pain level controlled Vital Signs Assessment: post-procedure vital signs reviewed and stable Respiratory status: spontaneous breathing Cardiovascular status: blood pressure returned to baseline Postop Assessment: no headache Anesthetic complications: no    Last Vitals:  Vitals:   05/25/16 0803 05/25/16 0806  BP: (!) 178/69 (!) 178/69  Pulse: 89 89  Resp: 16 14  Temp: 37.3 C 37.3 C    Last Pain:  Vitals:   05/25/16 0618  TempSrc: Oral                 Brantley Fling

## 2016-05-25 NOTE — H&P (Signed)
The History and Physical notes are on paper, have been signed, and are to be scanned. The patient remains stable and unchanged from the H&P.   Previous H&P reviewed, patient examined, and there are no changes.  Bethany Lee 05/25/2016 7:08 AM

## 2016-05-25 NOTE — Discharge Instructions (Signed)
Eye Surgery Discharge Instructions  Expect mild scratchy sensation or mild soreness. DO NOT RUB YOUR EYE!  The day of surgery:  Minimal physical activity, but bed rest is not required  No reading, computer work, or close hand work  No bending, lifting, or straining.  May watch TV  For 24 hours:  No driving, legal decisions, or alcoholic beverages  Safety precautions  Eat anything you prefer: It is better to start with liquids, then soup then solid foods.  _____ Eye patch should be worn until postoperative exam tomorrow.  ____ Solar shield eyeglasses should be worn for comfort in the sunlight/patch while sleeping  Resume all regular medications including aspirin or Coumadin if these were discontinued prior to surgery. You may shower, bathe, shave, or wash your hair. Tylenol may be taken for mild discomfort.  Call your doctor if you experience significant pain, nausea, or vomiting, fever > 101 or other signs of infection. [phone] or [phone] Specific instructions:  Follow-up Information    Benay Pillow, MD .   Specialty:  Ophthalmology Why:  November 3 at 10:00am Contact information: [address] [place] [address] [phone]

## 2016-05-25 NOTE — Op Note (Signed)
OPERATIVE NOTE  Bethany Lee [id number] 05/25/2016   PREOPERATIVE DIAGNOSIS:  Nuclear sclerotic cataract right eye.  H25.11   POSTOPERATIVE DIAGNOSIS:    Nuclear sclerotic cataract right eye.     PROCEDURE:  Phacoemusification with posterior chamber intraocular lens placement of the right eye   LENS:   Implant Name Type Inv. Item Serial No. Manufacturer Lot No. LRB No. Used  LENS IOL DIOP 25.5 - [id number] Intraocular Lens LENS IOL DIOP 25.5 [id number]   Right 1       PCB00 +25.5   ULTRASOUND TIME: 0 minutes 54 seconds.  CDE 7.21   SURGEON:  Benay Pillow, MD, MPH  ANESTHESIOLOGIST: Anesthesiologist: Emmie Niemann, MD CRNA: Rolla Plate, CRNA   ANESTHESIA:  Topical with tetracaine drops augmented with 1% preservative-free intracameral lidocaine.  ESTIMATED BLOOD LOSS: less than 1 mL.   COMPLICATIONS:  None.   DESCRIPTION OF PROCEDURE:  The patient was identified in the holding room and transported to the operating room and placed in the supine position under the operating microscope.  The right eye was identified as the operative eye and it was prepped and draped in the usual sterile ophthalmic fashion.   A 1.0 millimeter clear-corneal paracentesis was made at the 10:30 position. 0.5 ml of preservative-free 1% lidocaine with epinephrine was injected into the anterior chamber.  The anterior chamber was filled with Healon 5 viscoelastic.  A 2.4 millimeter keratome was used to make a near-clear corneal incision at the 8:00 position.  A curvilinear capsulorrhexis was made with a cystotome and capsulorrhexis forceps.  Balanced salt solution was used to hydrodissect and hydrodelineate the nucleus.   Phacoemulsification was then used in stop and chop fashion to remove the lens nucleus and epinucleus.  The remaining cortex was then removed using the irrigation and aspiration handpiece. Healon was then placed into the capsular bag to distend it for lens placement.  A lens was  then injected into the capsular bag.  The remaining viscoelastic was aspirated.   Wounds were hydrated with balanced salt solution.  The anterior chamber was inflated to a physiologic pressure with balanced salt solution.   Intracameral vigamox 0.1 mL undiluted was injected into the eye.  Good routine case.  No wound leaks were noted.  Topical Vigamox drops were applied to the eye.  The patient was taken to the recovery room in stable condition without complications of anesthesia or surgery  Benay Pillow 05/25/2016, 8:02 AM

## 2016-05-25 NOTE — Anesthesia Preprocedure Evaluation (Signed)
Anesthesia Evaluation  Patient identified by MRN, date of birth, ID band Patient awake    Reviewed: Allergy & Precautions, NPO status , Patient's Chart, lab work & pertinent test results  History of Anesthesia Complications (+) PONV and history of anesthetic complications  Airway Mallampati: II  TM Distance: >3 FB Neck ROM: Full    Dental no notable dental hx.    Pulmonary neg sleep apnea, neg COPD, former smoker,    breath sounds clear to auscultation- rhonchi (-) wheezing      Cardiovascular Exercise Tolerance: Good hypertension, Pt. on medications (-) CAD and (-) Past MI  Rhythm:Regular Rate:Normal - Systolic murmurs and - Diastolic murmurs    Neuro/Psych negative neurological ROS  negative psych ROS   GI/Hepatic negative GI ROS, Neg liver ROS,   Endo/Other  diabetes, Insulin DependentHypothyroidism   Renal/GU ESRF and DialysisRenal disease (peritoneal dialysis)     Musculoskeletal   Abdominal (+) - obese,   Peds  Hematology  (+) anemia ,   Anesthesia Other Findings Past Medical History: No date: Anemia No date: Chronic kidney disease     Comment: peritoneal dialysis No date: Complication of anesthesia No date: Diabetes mellitus without complication (HCC) No date: Dyspnea     Comment: doe No date: Edema No date: Hypercholesterolemia No date: Hypertension No date: Hypothyroidism No date: Neuropathy (HCC) No date: PONV (postoperative nausea and vomiting) No date: Shingles     Comment: 3 weeks ago No date: Tremors of nervous system     Comment: intermittent when taking gabapentin   Reproductive/Obstetrics                             Anesthesia Physical Anesthesia Plan  ASA: III  Anesthesia Plan: MAC   Post-op Pain Management:    Induction: Intravenous  Airway Management Planned: Natural Airway  Additional Equipment:   Intra-op Plan:   Post-operative Plan:    Informed Consent: I have reviewed the patients History and Physical, chart, labs and discussed the procedure including the risks, benefits and alternatives for the proposed anesthesia with the patient or authorized representative who has indicated his/her understanding and acceptance.     Plan Discussed with: CRNA and Anesthesiologist  Anesthesia Plan Comments:         Anesthesia Quick Evaluation

## 2016-05-25 NOTE — Transfer of Care (Signed)
Immediate Anesthesia Transfer of Care Note  Patient: Athenia Rys  Procedure(s) Performed: Procedure(s) with comments: CATARACT EXTRACTION PHACO AND INTRAOCULAR LENS PLACEMENT ([place]) (Right) - Lot # [id number] H [place]: 00:5.2 AP%: 11.3 CDE: 7.21  Patient Location: Short Stay  Anesthesia Type:MAC  Level of Consciousness: awake and alert   Airway & Oxygen Therapy: Patient Spontanous Breathing  Post-op Assessment: Report given to RN and Post -op Vital signs reviewed and stable  Post vital signs: Reviewed  Last Vitals:  Vitals:   05/25/16 0803 05/25/16 0806  BP: (!) 178/69 (!) 178/69  Pulse: 89 89  Resp: 16 14  Temp: 37.3 C 37.3 C    Last Pain:  Vitals:   05/25/16 0618  TempSrc: Oral         Complications: No apparent anesthesia complications

## 2016-05-26 DIAGNOSIS — Z992 Dependence on renal dialysis: Secondary | ICD-10-CM | POA: Diagnosis not present

## 2016-05-26 DIAGNOSIS — N186 End stage renal disease: Secondary | ICD-10-CM | POA: Diagnosis not present

## 2016-05-28 DIAGNOSIS — Z992 Dependence on renal dialysis: Secondary | ICD-10-CM | POA: Diagnosis not present

## 2016-05-28 DIAGNOSIS — N186 End stage renal disease: Secondary | ICD-10-CM | POA: Diagnosis not present

## 2016-05-29 DIAGNOSIS — N186 End stage renal disease: Secondary | ICD-10-CM | POA: Diagnosis not present

## 2016-05-29 DIAGNOSIS — Z992 Dependence on renal dialysis: Secondary | ICD-10-CM | POA: Diagnosis not present

## 2016-05-30 DIAGNOSIS — N186 End stage renal disease: Secondary | ICD-10-CM | POA: Diagnosis not present

## 2016-05-30 DIAGNOSIS — Z992 Dependence on renal dialysis: Secondary | ICD-10-CM | POA: Diagnosis not present

## 2016-05-31 DIAGNOSIS — N186 End stage renal disease: Secondary | ICD-10-CM | POA: Diagnosis not present

## 2016-05-31 DIAGNOSIS — Z992 Dependence on renal dialysis: Secondary | ICD-10-CM | POA: Diagnosis not present

## 2016-06-01 DIAGNOSIS — Z992 Dependence on renal dialysis: Secondary | ICD-10-CM | POA: Diagnosis not present

## 2016-06-01 DIAGNOSIS — N186 End stage renal disease: Secondary | ICD-10-CM | POA: Diagnosis not present

## 2016-06-02 DIAGNOSIS — N186 End stage renal disease: Secondary | ICD-10-CM | POA: Diagnosis not present

## 2016-06-02 DIAGNOSIS — Z992 Dependence on renal dialysis: Secondary | ICD-10-CM | POA: Diagnosis not present

## 2016-06-06 DIAGNOSIS — Z01818 Encounter for other preprocedural examination: Secondary | ICD-10-CM | POA: Diagnosis not present

## 2016-06-06 DIAGNOSIS — N186 End stage renal disease: Secondary | ICD-10-CM | POA: Diagnosis not present

## 2016-06-06 DIAGNOSIS — Z794 Long term (current) use of insulin: Secondary | ICD-10-CM | POA: Diagnosis not present

## 2016-06-06 DIAGNOSIS — E039 Hypothyroidism, unspecified: Secondary | ICD-10-CM | POA: Diagnosis not present

## 2016-06-06 DIAGNOSIS — E1121 Type 2 diabetes mellitus with diabetic nephropathy: Secondary | ICD-10-CM | POA: Diagnosis not present

## 2016-06-06 DIAGNOSIS — Z992 Dependence on renal dialysis: Secondary | ICD-10-CM | POA: Diagnosis not present

## 2016-06-06 DIAGNOSIS — E118 Type 2 diabetes mellitus with unspecified complications: Secondary | ICD-10-CM | POA: Diagnosis not present

## 2016-06-07 DIAGNOSIS — N186 End stage renal disease: Secondary | ICD-10-CM | POA: Diagnosis not present

## 2016-06-07 DIAGNOSIS — Z992 Dependence on renal dialysis: Secondary | ICD-10-CM | POA: Diagnosis not present

## 2016-06-08 DIAGNOSIS — Z992 Dependence on renal dialysis: Secondary | ICD-10-CM | POA: Diagnosis not present

## 2016-06-08 DIAGNOSIS — E039 Hypothyroidism, unspecified: Secondary | ICD-10-CM | POA: Insufficient documentation

## 2016-06-08 DIAGNOSIS — N186 End stage renal disease: Secondary | ICD-10-CM | POA: Diagnosis not present

## 2016-06-09 DIAGNOSIS — Z992 Dependence on renal dialysis: Secondary | ICD-10-CM | POA: Diagnosis not present

## 2016-06-09 DIAGNOSIS — N186 End stage renal disease: Secondary | ICD-10-CM | POA: Diagnosis not present

## 2016-06-11 DIAGNOSIS — Z992 Dependence on renal dialysis: Secondary | ICD-10-CM | POA: Diagnosis not present

## 2016-06-11 DIAGNOSIS — N186 End stage renal disease: Secondary | ICD-10-CM | POA: Diagnosis not present

## 2016-06-12 DIAGNOSIS — Z992 Dependence on renal dialysis: Secondary | ICD-10-CM | POA: Diagnosis not present

## 2016-06-12 DIAGNOSIS — N186 End stage renal disease: Secondary | ICD-10-CM | POA: Diagnosis not present

## 2016-06-13 DIAGNOSIS — N186 End stage renal disease: Secondary | ICD-10-CM | POA: Diagnosis not present

## 2016-06-13 DIAGNOSIS — Z992 Dependence on renal dialysis: Secondary | ICD-10-CM | POA: Diagnosis not present

## 2016-06-14 DIAGNOSIS — N186 End stage renal disease: Secondary | ICD-10-CM | POA: Diagnosis not present

## 2016-06-14 DIAGNOSIS — Z992 Dependence on renal dialysis: Secondary | ICD-10-CM | POA: Diagnosis not present

## 2016-06-15 DIAGNOSIS — Z992 Dependence on renal dialysis: Secondary | ICD-10-CM | POA: Diagnosis not present

## 2016-06-15 DIAGNOSIS — N186 End stage renal disease: Secondary | ICD-10-CM | POA: Diagnosis not present

## 2016-06-16 DIAGNOSIS — N186 End stage renal disease: Secondary | ICD-10-CM | POA: Diagnosis not present

## 2016-06-16 DIAGNOSIS — Z992 Dependence on renal dialysis: Secondary | ICD-10-CM | POA: Diagnosis not present

## 2016-06-18 DIAGNOSIS — Z992 Dependence on renal dialysis: Secondary | ICD-10-CM | POA: Diagnosis not present

## 2016-06-18 DIAGNOSIS — N186 End stage renal disease: Secondary | ICD-10-CM | POA: Diagnosis not present

## 2016-06-19 DIAGNOSIS — Z992 Dependence on renal dialysis: Secondary | ICD-10-CM | POA: Diagnosis not present

## 2016-06-19 DIAGNOSIS — N186 End stage renal disease: Secondary | ICD-10-CM | POA: Diagnosis not present

## 2016-06-20 DIAGNOSIS — Z992 Dependence on renal dialysis: Secondary | ICD-10-CM | POA: Diagnosis not present

## 2016-06-20 DIAGNOSIS — N186 End stage renal disease: Secondary | ICD-10-CM | POA: Diagnosis not present

## 2016-06-21 DIAGNOSIS — N186 End stage renal disease: Secondary | ICD-10-CM | POA: Diagnosis not present

## 2016-06-21 DIAGNOSIS — Z992 Dependence on renal dialysis: Secondary | ICD-10-CM | POA: Diagnosis not present

## 2016-06-21 DIAGNOSIS — E113512 Type 2 diabetes mellitus with proliferative diabetic retinopathy with macular edema, left eye: Secondary | ICD-10-CM | POA: Diagnosis not present

## 2016-06-21 DIAGNOSIS — E113511 Type 2 diabetes mellitus with proliferative diabetic retinopathy with macular edema, right eye: Secondary | ICD-10-CM | POA: Diagnosis not present

## 2016-06-22 DIAGNOSIS — N186 End stage renal disease: Secondary | ICD-10-CM | POA: Diagnosis not present

## 2016-06-22 DIAGNOSIS — Z992 Dependence on renal dialysis: Secondary | ICD-10-CM | POA: Diagnosis not present

## 2016-06-23 DIAGNOSIS — N186 End stage renal disease: Secondary | ICD-10-CM | POA: Diagnosis not present

## 2016-06-23 DIAGNOSIS — Z23 Encounter for immunization: Secondary | ICD-10-CM | POA: Diagnosis not present

## 2016-06-23 DIAGNOSIS — Z992 Dependence on renal dialysis: Secondary | ICD-10-CM | POA: Diagnosis not present

## 2016-06-25 DIAGNOSIS — Z992 Dependence on renal dialysis: Secondary | ICD-10-CM | POA: Diagnosis not present

## 2016-06-25 DIAGNOSIS — Z23 Encounter for immunization: Secondary | ICD-10-CM | POA: Diagnosis not present

## 2016-06-25 DIAGNOSIS — N186 End stage renal disease: Secondary | ICD-10-CM | POA: Diagnosis not present

## 2016-06-26 DIAGNOSIS — Z992 Dependence on renal dialysis: Secondary | ICD-10-CM | POA: Diagnosis not present

## 2016-06-26 DIAGNOSIS — Z23 Encounter for immunization: Secondary | ICD-10-CM | POA: Diagnosis not present

## 2016-06-26 DIAGNOSIS — N186 End stage renal disease: Secondary | ICD-10-CM | POA: Diagnosis not present

## 2016-06-27 DIAGNOSIS — Z23 Encounter for immunization: Secondary | ICD-10-CM | POA: Diagnosis not present

## 2016-06-27 DIAGNOSIS — Z992 Dependence on renal dialysis: Secondary | ICD-10-CM | POA: Diagnosis not present

## 2016-06-27 DIAGNOSIS — N186 End stage renal disease: Secondary | ICD-10-CM | POA: Diagnosis not present

## 2016-06-28 DIAGNOSIS — Z23 Encounter for immunization: Secondary | ICD-10-CM | POA: Diagnosis not present

## 2016-06-28 DIAGNOSIS — N186 End stage renal disease: Secondary | ICD-10-CM | POA: Diagnosis not present

## 2016-06-28 DIAGNOSIS — Z992 Dependence on renal dialysis: Secondary | ICD-10-CM | POA: Diagnosis not present

## 2016-06-29 DIAGNOSIS — Z23 Encounter for immunization: Secondary | ICD-10-CM | POA: Diagnosis not present

## 2016-06-29 DIAGNOSIS — Z992 Dependence on renal dialysis: Secondary | ICD-10-CM | POA: Diagnosis not present

## 2016-06-29 DIAGNOSIS — N186 End stage renal disease: Secondary | ICD-10-CM | POA: Diagnosis not present

## 2016-06-30 DIAGNOSIS — Z992 Dependence on renal dialysis: Secondary | ICD-10-CM | POA: Diagnosis not present

## 2016-06-30 DIAGNOSIS — N186 End stage renal disease: Secondary | ICD-10-CM | POA: Diagnosis not present

## 2016-06-30 DIAGNOSIS — Z23 Encounter for immunization: Secondary | ICD-10-CM | POA: Diagnosis not present

## 2016-07-02 DIAGNOSIS — Z23 Encounter for immunization: Secondary | ICD-10-CM | POA: Diagnosis not present

## 2016-07-02 DIAGNOSIS — N186 End stage renal disease: Secondary | ICD-10-CM | POA: Diagnosis not present

## 2016-07-02 DIAGNOSIS — Z992 Dependence on renal dialysis: Secondary | ICD-10-CM | POA: Diagnosis not present

## 2016-07-03 DIAGNOSIS — Z992 Dependence on renal dialysis: Secondary | ICD-10-CM | POA: Diagnosis not present

## 2016-07-03 DIAGNOSIS — Z23 Encounter for immunization: Secondary | ICD-10-CM | POA: Diagnosis not present

## 2016-07-03 DIAGNOSIS — N186 End stage renal disease: Secondary | ICD-10-CM | POA: Diagnosis not present

## 2016-07-04 DIAGNOSIS — N186 End stage renal disease: Secondary | ICD-10-CM | POA: Diagnosis not present

## 2016-07-04 DIAGNOSIS — Z794 Long term (current) use of insulin: Secondary | ICD-10-CM | POA: Diagnosis not present

## 2016-07-04 DIAGNOSIS — E1122 Type 2 diabetes mellitus with diabetic chronic kidney disease: Secondary | ICD-10-CM | POA: Diagnosis not present

## 2016-07-04 DIAGNOSIS — Z23 Encounter for immunization: Secondary | ICD-10-CM | POA: Diagnosis not present

## 2016-07-04 DIAGNOSIS — R05 Cough: Secondary | ICD-10-CM | POA: Diagnosis not present

## 2016-07-04 DIAGNOSIS — Z992 Dependence on renal dialysis: Secondary | ICD-10-CM | POA: Diagnosis not present

## 2016-07-04 DIAGNOSIS — E039 Hypothyroidism, unspecified: Secondary | ICD-10-CM | POA: Diagnosis not present

## 2016-07-04 DIAGNOSIS — J40 Bronchitis, not specified as acute or chronic: Secondary | ICD-10-CM | POA: Diagnosis not present

## 2016-07-05 DIAGNOSIS — Z992 Dependence on renal dialysis: Secondary | ICD-10-CM | POA: Diagnosis not present

## 2016-07-05 DIAGNOSIS — N186 End stage renal disease: Secondary | ICD-10-CM | POA: Diagnosis not present

## 2016-07-05 DIAGNOSIS — Z23 Encounter for immunization: Secondary | ICD-10-CM | POA: Diagnosis not present

## 2016-07-06 DIAGNOSIS — Z23 Encounter for immunization: Secondary | ICD-10-CM | POA: Diagnosis not present

## 2016-07-06 DIAGNOSIS — N186 End stage renal disease: Secondary | ICD-10-CM | POA: Diagnosis not present

## 2016-07-06 DIAGNOSIS — Z992 Dependence on renal dialysis: Secondary | ICD-10-CM | POA: Diagnosis not present

## 2016-07-07 DIAGNOSIS — N186 End stage renal disease: Secondary | ICD-10-CM | POA: Diagnosis not present

## 2016-07-07 DIAGNOSIS — Z992 Dependence on renal dialysis: Secondary | ICD-10-CM | POA: Diagnosis not present

## 2016-07-07 DIAGNOSIS — Z23 Encounter for immunization: Secondary | ICD-10-CM | POA: Diagnosis not present

## 2016-07-09 DIAGNOSIS — N186 End stage renal disease: Secondary | ICD-10-CM | POA: Diagnosis not present

## 2016-07-09 DIAGNOSIS — Z992 Dependence on renal dialysis: Secondary | ICD-10-CM | POA: Diagnosis not present

## 2016-07-09 DIAGNOSIS — Z23 Encounter for immunization: Secondary | ICD-10-CM | POA: Diagnosis not present

## 2016-07-10 DIAGNOSIS — Z23 Encounter for immunization: Secondary | ICD-10-CM | POA: Diagnosis not present

## 2016-07-10 DIAGNOSIS — N186 End stage renal disease: Secondary | ICD-10-CM | POA: Diagnosis not present

## 2016-07-10 DIAGNOSIS — Z992 Dependence on renal dialysis: Secondary | ICD-10-CM | POA: Diagnosis not present

## 2016-07-11 DIAGNOSIS — N186 End stage renal disease: Secondary | ICD-10-CM | POA: Diagnosis not present

## 2016-07-11 DIAGNOSIS — Z23 Encounter for immunization: Secondary | ICD-10-CM | POA: Diagnosis not present

## 2016-07-11 DIAGNOSIS — Z992 Dependence on renal dialysis: Secondary | ICD-10-CM | POA: Diagnosis not present

## 2016-07-12 DIAGNOSIS — Z992 Dependence on renal dialysis: Secondary | ICD-10-CM | POA: Diagnosis not present

## 2016-07-12 DIAGNOSIS — N186 End stage renal disease: Secondary | ICD-10-CM | POA: Diagnosis not present

## 2016-07-12 DIAGNOSIS — Z23 Encounter for immunization: Secondary | ICD-10-CM | POA: Diagnosis not present

## 2016-07-13 DIAGNOSIS — Z992 Dependence on renal dialysis: Secondary | ICD-10-CM | POA: Diagnosis not present

## 2016-07-13 DIAGNOSIS — N186 End stage renal disease: Secondary | ICD-10-CM | POA: Diagnosis not present

## 2016-07-13 DIAGNOSIS — Z23 Encounter for immunization: Secondary | ICD-10-CM | POA: Diagnosis not present

## 2016-07-14 DIAGNOSIS — Z23 Encounter for immunization: Secondary | ICD-10-CM | POA: Diagnosis not present

## 2016-07-14 DIAGNOSIS — N186 End stage renal disease: Secondary | ICD-10-CM | POA: Diagnosis not present

## 2016-07-14 DIAGNOSIS — Z992 Dependence on renal dialysis: Secondary | ICD-10-CM | POA: Diagnosis not present

## 2016-07-16 ENCOUNTER — Emergency Department: Payer: Commercial Managed Care - HMO

## 2016-07-16 ENCOUNTER — Inpatient Hospital Stay
Admission: EM | Admit: 2016-07-16 | Discharge: 2016-07-20 | DRG: 682 | Disposition: A | Discharge status: Home / Self Care | Payer: Commercial Managed Care - HMO | Attending: Internal Medicine | Admitting: Internal Medicine

## 2016-07-16 DIAGNOSIS — D631 Anemia in chronic kidney disease: Secondary | ICD-10-CM | POA: Diagnosis not present

## 2016-07-16 DIAGNOSIS — Z992 Dependence on renal dialysis: Secondary | ICD-10-CM | POA: Diagnosis not present

## 2016-07-16 DIAGNOSIS — I248 Other forms of acute ischemic heart disease: Secondary | ICD-10-CM | POA: Diagnosis present

## 2016-07-16 DIAGNOSIS — J9601 Acute respiratory failure with hypoxia: Secondary | ICD-10-CM | POA: Diagnosis present

## 2016-07-16 DIAGNOSIS — I5041 Acute combined systolic (congestive) and diastolic (congestive) heart failure: Secondary | ICD-10-CM | POA: Diagnosis not present

## 2016-07-16 DIAGNOSIS — R000 Tachycardia, unspecified: Secondary | ICD-10-CM | POA: Diagnosis present

## 2016-07-16 DIAGNOSIS — J9600 Acute respiratory failure, unspecified whether with hypoxia or hypercapnia: Secondary | ICD-10-CM

## 2016-07-16 DIAGNOSIS — J811 Chronic pulmonary edema: Secondary | ICD-10-CM | POA: Diagnosis not present

## 2016-07-16 DIAGNOSIS — N186 End stage renal disease: Secondary | ICD-10-CM

## 2016-07-16 DIAGNOSIS — Z7982 Long term (current) use of aspirin: Secondary | ICD-10-CM

## 2016-07-16 DIAGNOSIS — I10 Essential (primary) hypertension: Secondary | ICD-10-CM | POA: Diagnosis not present

## 2016-07-16 DIAGNOSIS — E1140 Type 2 diabetes mellitus with diabetic neuropathy, unspecified: Secondary | ICD-10-CM | POA: Diagnosis present

## 2016-07-16 DIAGNOSIS — R0602 Shortness of breath: Secondary | ICD-10-CM

## 2016-07-16 DIAGNOSIS — Z880 Allergy status to penicillin: Secondary | ICD-10-CM | POA: Diagnosis not present

## 2016-07-16 DIAGNOSIS — E782 Mixed hyperlipidemia: Secondary | ICD-10-CM | POA: Diagnosis not present

## 2016-07-16 DIAGNOSIS — R748 Abnormal levels of other serum enzymes: Secondary | ICD-10-CM | POA: Diagnosis present

## 2016-07-16 DIAGNOSIS — R7989 Other specified abnormal findings of blood chemistry: Secondary | ICD-10-CM | POA: Diagnosis not present

## 2016-07-16 DIAGNOSIS — N2581 Secondary hyperparathyroidism of renal origin: Secondary | ICD-10-CM | POA: Diagnosis not present

## 2016-07-16 DIAGNOSIS — R072 Precordial pain: Secondary | ICD-10-CM | POA: Diagnosis not present

## 2016-07-16 DIAGNOSIS — Z794 Long term (current) use of insulin: Secondary | ICD-10-CM | POA: Diagnosis not present

## 2016-07-16 DIAGNOSIS — E1122 Type 2 diabetes mellitus with diabetic chronic kidney disease: Secondary | ICD-10-CM | POA: Diagnosis present

## 2016-07-16 DIAGNOSIS — Z87891 Personal history of nicotine dependence: Secondary | ICD-10-CM | POA: Diagnosis not present

## 2016-07-16 DIAGNOSIS — I120 Hypertensive chronic kidney disease with stage 5 chronic kidney disease or end stage renal disease: Secondary | ICD-10-CM | POA: Diagnosis not present

## 2016-07-16 DIAGNOSIS — R0600 Dyspnea, unspecified: Secondary | ICD-10-CM | POA: Diagnosis not present

## 2016-07-16 DIAGNOSIS — Z79891 Long term (current) use of opiate analgesic: Secondary | ICD-10-CM | POA: Diagnosis not present

## 2016-07-16 DIAGNOSIS — J810 Acute pulmonary edema: Secondary | ICD-10-CM | POA: Diagnosis not present

## 2016-07-16 DIAGNOSIS — R0789 Other chest pain: Secondary | ICD-10-CM | POA: Diagnosis not present

## 2016-07-16 DIAGNOSIS — I200 Unstable angina: Secondary | ICD-10-CM

## 2016-07-16 DIAGNOSIS — Z23 Encounter for immunization: Secondary | ICD-10-CM | POA: Diagnosis not present

## 2016-07-16 DIAGNOSIS — E039 Hypothyroidism, unspecified: Secondary | ICD-10-CM | POA: Diagnosis present

## 2016-07-16 DIAGNOSIS — Z79899 Other long term (current) drug therapy: Secondary | ICD-10-CM | POA: Diagnosis not present

## 2016-07-16 DIAGNOSIS — I1311 Hypertensive heart and chronic kidney disease without heart failure, with stage 5 chronic kidney disease, or end stage renal disease: Principal | ICD-10-CM | POA: Diagnosis present

## 2016-07-16 DIAGNOSIS — I214 Non-ST elevation (NSTEMI) myocardial infarction: Secondary | ICD-10-CM | POA: Diagnosis not present

## 2016-07-16 DIAGNOSIS — R05 Cough: Secondary | ICD-10-CM | POA: Diagnosis not present

## 2016-07-16 DIAGNOSIS — I5031 Acute diastolic (congestive) heart failure: Secondary | ICD-10-CM | POA: Diagnosis not present

## 2016-07-16 LAB — CBC
HEMATOCRIT: 30 % — AB (ref 35.0–47.0)
Hemoglobin: 10.2 g/dL — ABNORMAL LOW (ref 12.0–16.0)
MCH: 30.7 pg (ref 26.0–34.0)
MCHC: 33.9 g/dL (ref 32.0–36.0)
MCV: 90.6 fL (ref 80.0–100.0)
Platelets: 205 10*3/uL (ref 150–440)
RBC: 3.31 MIL/uL — ABNORMAL LOW (ref 3.80–5.20)
RDW: 15.1 % — AB (ref 11.5–14.5)
WBC: 11.1 10*3/uL — ABNORMAL HIGH (ref 3.6–11.0)

## 2016-07-16 LAB — GLUCOSE, CAPILLARY
GLUCOSE-CAPILLARY: 134 mg/dL — AB (ref 65–99)
GLUCOSE-CAPILLARY: 261 mg/dL — AB (ref 65–99)
GLUCOSE-CAPILLARY: 337 mg/dL — AB (ref 65–99)
GLUCOSE-CAPILLARY: 298 mg/dL — AB (ref 65–99)

## 2016-07-16 LAB — BLOOD GAS, ARTERIAL
Acid-base deficit: 8.8 mmol/L — ABNORMAL HIGH (ref 0.0–2.0)
Bicarbonate: 17.4 mmol/L — ABNORMAL LOW (ref 20.0–28.0)
Delivery systems: POSITIVE
Expiratory PAP: 6
FIO2: 0.4
INSPIRATORY PAP: 12
O2 Saturation: 97.8 %
Patient temperature: 37
pCO2 arterial: 38 mmHg (ref 32.0–48.0)
pH, Arterial: 7.27 — ABNORMAL LOW (ref 7.350–7.450)
pO2, Arterial: 113 mmHg — ABNORMAL HIGH (ref 83.0–108.0)

## 2016-07-16 LAB — BASIC METABOLIC PANEL
Anion gap: 14 (ref 5–15)
BUN: 92 mg/dL — AB (ref 6–20)
CHLORIDE: 104 mmol/L (ref 101–111)
CO2: 22 mmol/L (ref 22–32)
CREATININE: 13.85 mg/dL — AB (ref 0.44–1.00)
Calcium: 7.3 mg/dL — ABNORMAL LOW (ref 8.9–10.3)
GFR calc Af Amer: 3 mL/min — ABNORMAL LOW (ref >60)
GFR calc non Af Amer: 2 mL/min — ABNORMAL LOW (ref >60)
Glucose, Bld: 120 mg/dL — ABNORMAL HIGH (ref 65–99)
Potassium: 5 mmol/L (ref 3.5–5.1)
Sodium: 140 mmol/L (ref 135–145)

## 2016-07-16 LAB — PROTIME-INR
INR: 0.95
PROTHROMBIN TIME: 12.7 s (ref 11.4–15.2)

## 2016-07-16 LAB — BRAIN NATRIURETIC PEPTIDE: B Natriuretic Peptide: 1586 pg/mL — ABNORMAL HIGH (ref 0.0–100.0)

## 2016-07-16 LAB — HEPARIN LEVEL (UNFRACTIONATED)

## 2016-07-16 LAB — TROPONIN I
TROPONIN I: 0.15 ng/mL — AB (ref <0.03)
TROPONIN I: 0.14 ng/mL — AB (ref <0.03)
Troponin I: 0.16 ng/mL (ref <0.03)

## 2016-07-16 LAB — MRSA PCR SCREENING: MRSA BY PCR: NEGATIVE

## 2016-07-16 LAB — APTT: APTT: 33 s (ref 24–36)

## 2016-07-16 MED ORDER — GABAPENTIN 300 MG PO CAPS
300.0000 mg | ORAL_CAPSULE | Freq: Three times a day (TID) | ORAL | Status: DC
  Administered 2016-07-16 (×2): 300 mg via ORAL
  Filled 2016-07-16 (×4): qty 1

## 2016-07-16 MED ORDER — PRAVASTATIN SODIUM 20 MG PO TABS
40.0000 mg | ORAL_TABLET | Freq: Every day | ORAL | Status: DC
  Administered 2016-07-16 – 2016-07-19 (×4): 40 mg via ORAL
  Filled 2016-07-16 (×2): qty 1
  Filled 2016-07-16 (×2): qty 2

## 2016-07-16 MED ORDER — LEVOTHYROXINE SODIUM 50 MCG PO TABS
50.0000 ug | ORAL_TABLET | Freq: Every day | ORAL | Status: DC
  Administered 2016-07-17 – 2016-07-20 (×4): 50 ug via ORAL
  Filled 2016-07-16 (×4): qty 1

## 2016-07-16 MED ORDER — ASPIRIN 81 MG PO CHEW
81.0000 mg | CHEWABLE_TABLET | Freq: Every day | ORAL | Status: DC
  Administered 2016-07-16 – 2016-07-20 (×5): 81 mg via ORAL
  Filled 2016-07-16 (×5): qty 1

## 2016-07-16 MED ORDER — RENA-VITE PO TABS
1.0000 | ORAL_TABLET | Freq: Every day | ORAL | Status: DC
  Administered 2016-07-16 – 2016-07-19 (×3): 1 via ORAL
  Filled 2016-07-16 (×4): qty 1

## 2016-07-16 MED ORDER — VITAMIN B-12 1000 MCG PO TABS
1000.0000 ug | ORAL_TABLET | Freq: Every day | ORAL | Status: DC
  Administered 2016-07-17 – 2016-07-20 (×4): 1000 ug via ORAL
  Filled 2016-07-16 (×4): qty 1

## 2016-07-16 MED ORDER — IPRATROPIUM-ALBUTEROL 0.5-2.5 (3) MG/3ML IN SOLN
RESPIRATORY_TRACT | Status: AC
  Administered 2016-07-16: 3 mL via RESPIRATORY_TRACT
  Filled 2016-07-16: qty 3

## 2016-07-16 MED ORDER — METHYLPREDNISOLONE SODIUM SUCC 125 MG IJ SOLR
INTRAMUSCULAR | Status: AC
  Administered 2016-07-16: 125 mg via INTRAVENOUS
  Filled 2016-07-16: qty 2

## 2016-07-16 MED ORDER — AMLODIPINE BESYLATE 5 MG PO TABS
5.0000 mg | ORAL_TABLET | Freq: Every day | ORAL | Status: DC
  Administered 2016-07-16 – 2016-07-18 (×3): 5 mg via ORAL
  Filled 2016-07-16 (×3): qty 1

## 2016-07-16 MED ORDER — HEPARIN (PORCINE) IN NACL 100-0.45 UNIT/ML-% IJ SOLN
900.0000 [IU]/h | INTRAMUSCULAR | Status: DC
  Administered 2016-07-16: 900 [IU]/h via INTRAVENOUS

## 2016-07-16 MED ORDER — INSULIN ASPART 100 UNIT/ML ~~LOC~~ SOLN
3.0000 [IU] | Freq: Three times a day (TID) | SUBCUTANEOUS | Status: DC
  Administered 2016-07-17 – 2016-07-20 (×7): 3 [IU] via SUBCUTANEOUS
  Filled 2016-07-16 (×7): qty 3

## 2016-07-16 MED ORDER — MAGNESIUM SULFATE 2 GM/50ML IV SOLN
2.0000 g | Freq: Once | INTRAVENOUS | Status: AC
  Administered 2016-07-16: 2 g via INTRAVENOUS
  Filled 2016-07-16: qty 50

## 2016-07-16 MED ORDER — IOPAMIDOL (ISOVUE-370) INJECTION 76%
75.0000 mL | Freq: Once | INTRAVENOUS | Status: AC | PRN
  Administered 2016-07-16: 75 mL via INTRAVENOUS

## 2016-07-16 MED ORDER — SODIUM CHLORIDE 0.9 % IV SOLN: 250.0000 mL | INTRAVENOUS | Status: DC | PRN

## 2016-07-16 MED ORDER — ONDANSETRON HCL 4 MG/2ML IJ SOLN: 4.0000 mg | Freq: Four times a day (QID) | INTRAMUSCULAR | Status: DC | PRN

## 2016-07-16 MED ORDER — NITROGLYCERIN 0.4 MG SL SUBL
0.4000 mg | SUBLINGUAL_TABLET | SUBLINGUAL | Status: DC | PRN
  Administered 2016-07-16: 0.4 mg via SUBLINGUAL

## 2016-07-16 MED ORDER — SODIUM CHLORIDE 0.9 % IV BOLUS (SEPSIS)
500.0000 mL | Freq: Once | INTRAVENOUS | Status: AC
  Administered 2016-07-16: 500 mL via INTRAVENOUS

## 2016-07-16 MED ORDER — IPRATROPIUM-ALBUTEROL 0.5-2.5 (3) MG/3ML IN SOLN
3.0000 mL | Freq: Once | RESPIRATORY_TRACT | Status: AC
  Administered 2016-07-16: 3 mL via RESPIRATORY_TRACT

## 2016-07-16 MED ORDER — DELFLEX-LC/2.5% DEXTROSE 394 MOSM/L IP SOLN
INTRAPERITONEAL | Status: DC
  Administered 2016-07-16 – 2016-07-19 (×3): via INTRAPERITONEAL
  Filled 2016-07-16 (×4): qty 3000

## 2016-07-16 MED ORDER — NITROGLYCERIN IN D5W 200-5 MCG/ML-% IV SOLN: 0.0000 ug/min | Freq: Once | INTRAVENOUS | Status: DC

## 2016-07-16 MED ORDER — HEPARIN (PORCINE) IN NACL 100-0.45 UNIT/ML-% IJ SOLN
700.0000 [IU]/h | INTRAMUSCULAR | Status: DC
  Administered 2016-07-16: 700 [IU]/h via INTRAVENOUS
  Filled 2016-07-16: qty 250

## 2016-07-16 MED ORDER — FUROSEMIDE 40 MG PO TABS
80.0000 mg | ORAL_TABLET | Freq: Two times a day (BID) | ORAL | Status: DC
  Administered 2016-07-16 – 2016-07-20 (×8): 80 mg via ORAL
  Filled 2016-07-16: qty 1
  Filled 2016-07-16 (×2): qty 2
  Filled 2016-07-16: qty 1
  Filled 2016-07-16 (×2): qty 2
  Filled 2016-07-16: qty 1
  Filled 2016-07-16: qty 2

## 2016-07-16 MED ORDER — CALCIUM ACETATE (PHOS BINDER) 667 MG PO CAPS
2001.0000 mg | ORAL_CAPSULE | Freq: Three times a day (TID) | ORAL | Status: DC
  Administered 2016-07-16 – 2016-07-20 (×10): 2001 mg via ORAL
  Filled 2016-07-16 (×12): qty 3

## 2016-07-16 MED ORDER — INSULIN ASPART 100 UNIT/ML ~~LOC~~ SOLN
0.0000 [IU] | Freq: Three times a day (TID) | SUBCUTANEOUS | Status: DC
  Administered 2016-07-17 – 2016-07-19 (×3): 3 [IU] via SUBCUTANEOUS
  Administered 2016-07-20: 5 [IU] via SUBCUTANEOUS
  Filled 2016-07-16: qty 3
  Filled 2016-07-16: qty 5
  Filled 2016-07-16 (×3): qty 3

## 2016-07-16 MED ORDER — ALBUTEROL SULFATE (2.5 MG/3ML) 0.083% IN NEBU
5.0000 mg | INHALATION_SOLUTION | Freq: Once | RESPIRATORY_TRACT | Status: AC
  Administered 2016-07-16: 5 mg via RESPIRATORY_TRACT
  Filled 2016-07-16: qty 6

## 2016-07-16 MED ORDER — NITROGLYCERIN IN D5W 200-5 MCG/ML-% IV SOLN
0.0000 ug/min | INTRAVENOUS | Status: DC
  Administered 2016-07-16: 5 ug/min via INTRAVENOUS
  Filled 2016-07-16: qty 250

## 2016-07-16 MED ORDER — METHYLPREDNISOLONE SODIUM SUCC 125 MG IJ SOLR
125.0000 mg | Freq: Once | INTRAMUSCULAR | Status: AC
  Administered 2016-07-16: 125 mg via INTRAVENOUS

## 2016-07-16 MED ORDER — NITROGLYCERIN 0.4 MG SL SUBL
SUBLINGUAL_TABLET | SUBLINGUAL | Status: AC
  Administered 2016-07-16: 0.4 mg via SUBLINGUAL
  Filled 2016-07-16: qty 3

## 2016-07-16 MED ORDER — FUROSEMIDE 10 MG/ML IJ SOLN
120.0000 mg | Freq: Once | INTRAVENOUS | Status: AC
  Administered 2016-07-16: 120 mg via INTRAVENOUS
  Filled 2016-07-16: qty 12

## 2016-07-16 MED ORDER — INSULIN GLARGINE 100 UNIT/ML ~~LOC~~ SOLN
10.0000 [IU] | Freq: Every day | SUBCUTANEOUS | Status: DC
  Administered 2016-07-16: 10 [IU] via SUBCUTANEOUS
  Filled 2016-07-16 (×2): qty 0.1

## 2016-07-16 MED ORDER — INSULIN ASPART 100 UNIT/ML ~~LOC~~ SOLN
0.0000 [IU] | Freq: Every day | SUBCUTANEOUS | Status: DC
  Administered 2016-07-16: 3 [IU] via SUBCUTANEOUS
  Filled 2016-07-16: qty 11
  Filled 2016-07-16: qty 3

## 2016-07-16 MED ORDER — GENTAMICIN SULFATE 0.1 % EX CREA
1.0000 "application " | TOPICAL_CREAM | Freq: Every day | CUTANEOUS | Status: DC
  Administered 2016-07-16 – 2016-07-18 (×2): 1 via TOPICAL
  Filled 2016-07-16: qty 15

## 2016-07-16 MED ORDER — METOLAZONE 5 MG PO TABS
5.0000 mg | ORAL_TABLET | Freq: Every day | ORAL | Status: DC
  Administered 2016-07-16 – 2016-07-20 (×5): 5 mg via ORAL
  Filled 2016-07-16 (×5): qty 1

## 2016-07-16 MED ORDER — OXYCODONE-ACETAMINOPHEN 5-325 MG PO TABS: 1.0000 | ORAL_TABLET | Freq: Four times a day (QID) | ORAL | Status: DC | PRN

## 2016-07-16 MED ORDER — DOCUSATE SODIUM 100 MG PO CAPS: 100.0000 mg | ORAL_CAPSULE | Freq: Every day | ORAL | Status: DC | PRN

## 2016-07-16 MED ORDER — INSULIN GLARGINE 300 UNIT/ML ~~LOC~~ SOPN: 10.0000 [IU] | PEN_INJECTOR | Freq: Every day | SUBCUTANEOUS | Status: DC

## 2016-07-16 MED ORDER — HEPARIN BOLUS VIA INFUSION
3500.0000 [IU] | Freq: Once | INTRAVENOUS | Status: AC
  Administered 2016-07-16: 3500 [IU] via INTRAVENOUS
  Filled 2016-07-16: qty 3500

## 2016-07-16 MED ORDER — SODIUM BICARBONATE 650 MG PO TABS
1300.0000 mg | ORAL_TABLET | Freq: Two times a day (BID) | ORAL | Status: DC
  Administered 2016-07-16 – 2016-07-18 (×3): 1300 mg via ORAL
  Filled 2016-07-16 (×4): qty 2

## 2016-07-16 MED ORDER — NITROGLYCERIN 0.4 MG SL SUBL
0.4000 mg | SUBLINGUAL_TABLET | SUBLINGUAL | Status: DC | PRN
  Administered 2016-07-16 (×2): 0.4 mg via SUBLINGUAL

## 2016-07-16 MED ORDER — METOPROLOL TARTRATE 5 MG/5ML IV SOLN: 2.5000 mg | INTRAVENOUS | Status: DC | PRN

## 2016-07-16 MED ORDER — ACETAMINOPHEN 325 MG PO TABS
650.0000 mg | ORAL_TABLET | ORAL | Status: DC | PRN
  Filled 2016-07-16: qty 2

## 2016-07-16 MED ORDER — INSULIN ASPART 100 UNIT/ML ~~LOC~~ SOLN
0.0000 [IU] | SUBCUTANEOUS | Status: DC
  Administered 2016-07-16: 11 [IU] via SUBCUTANEOUS

## 2016-07-16 MED ORDER — HEPARIN BOLUS VIA INFUSION
1800.0000 [IU] | Freq: Once | INTRAVENOUS | Status: AC
  Administered 2016-07-16: 1800 [IU] via INTRAVENOUS
  Filled 2016-07-16: qty 1800

## 2016-07-16 MED ORDER — DIALYVITE 3000 3 MG PO TABS: 1.0000 | ORAL_TABLET | Freq: Every day | ORAL | Status: DC

## 2016-07-16 NOTE — ED Notes (Signed)
Patient became increasingly tachypneic, diaphoretic with labored breathing, Dr. Joni Fears informed and at bedside.

## 2016-07-16 NOTE — ED Notes (Signed)
Patient back from CT.

## 2016-07-16 NOTE — ED Notes (Signed)
Dr. Joni Fears at bedside updating family

## 2016-07-16 NOTE — Consult Note (Signed)
[facility] Consultation Note  Patient ID: Bethany Lee, MRN: [id number], DOB/AGE: 24-Aug-1950 65 y.o. Admit date: 07/16/2016   Date of Consult: 07/16/2016 Primary Physician: Tracie Harrier, MD Primary Cardiologist: none  Chief Complaint:  Chief Complaint  Patient presents with  . Shortness of Breath  . Chest Pain   Reason for Consult: acute pulmonary edema and chest pressure  HPI: 65 y.o. female with known chronic kidney disease with end-stage disease status post peritoneal dialysis over the last 6 months with diabetes and complications essential hypertension and mixed hyperlipidemia previously on appropriate medication management for these issues. The patient has had good treatment of this and has not had any significant side effects or issues in recent months. The patient does have shortness of breath with physical activity relieved by rest over the last 6 months slowly increasing and frequency and her intensity. Last night she did miss some of her medications and did have no dialysis last night which she had episode of chest pressure and shortness of breath. This increased to which she felt uncomfortable and was seen in the emergency room. At that time the patient currently has not had any myocardial infarction with a normal troponin and EKG shows normal sinus rhythm. She did get BiPAP for her hypoxia and shortness of breath which is made all of her symptoms improved. The patient did have a chest x-ray and CAT scan consistent with pulmonary edema and no other changes. There is unknown etiology for this acute heart failure.  Past Medical History:  Diagnosis Date  . Anemia   . Chronic kidney disease    peritoneal dialysis  . Complication of anesthesia   . Diabetes mellitus without complication ([place])   . Dyspnea    doe  . Edema   . Hypercholesterolemia   . Hypertension   . Hypothyroidism   . Neuropathy ([place])   . PONV (postoperative nausea and vomiting)   .  Shingles    3 weeks ago  . Tremors of nervous system    intermittent when taking gabapentin      Surgical History:  Past Surgical History:  Procedure Laterality Date  . ABDOMINAL HYSTERECTOMY    . CAPD INSERTION N/A 11/25/2015   Procedure: LAPAROSCOPIC INSERTION CONTINUOUS AMBULATORY PERITONEAL DIALYSIS  (CAPD) CATHETER;  Surgeon: Algernon Huxley, MD;  Location: [facility] ORS;  Service: Vascular;  Laterality: N/A;  . CAPD INSERTION N/A 01/06/2016   Procedure: LAPAROSCOPIC INSERTION CONTINUOUS AMBULATORY PERITONEAL DIALYSIS  (CAPD) CATHETER REVISION ;  Surgeon: Algernon Huxley, MD;  Location: [facility] ORS;  Service: Vascular;  Laterality: N/A;  . CATARACT EXTRACTION W/PHACO Right 05/25/2016   Procedure: CATARACT EXTRACTION PHACO AND INTRAOCULAR LENS PLACEMENT ([place]);  Surgeon: Eulogio Bear, MD;  Location: [facility] ORS;  Service: Ophthalmology;  Laterality: Right;  Lot # [id number] H [place]: 00:5.2 AP%: 11.3 CDE: 7.21  . EYE SURGERY    . TUBAL LIGATION       Home Meds: Prior to Admission medications   Medication Sig Start Date End Date Taking? Authorizing Provider  amLODipine (NORVASC) 5 MG tablet Take 5 mg by mouth daily.   Yes Historical Provider, MD  aspirin EC 81 MG tablet Take 1 tablet (81 mg total) by mouth daily. Start taking after NOV 15 05/26/15  Yes Harmeet Candiss Norse, MD  calcium acetate (PHOSLO) 667 MG capsule Take 2,001 mg by mouth 3 (three) times daily with meals.   Yes Historical Provider, MD  docusate sodium (COLACE) 100 MG capsule Take 1 capsule (100  mg total) by mouth daily as needed. 05/04/16 05/04/17 Yes Schuyler Amor, MD  folic acid-vitamin b complex-vitamin c-selenium-zinc (DIALYVITE) 3 MG TABS tablet Take 1 tablet by mouth daily.   Yes Historical Provider, MD  furosemide (LASIX) 80 MG tablet Take 80 mg by mouth 2 (two) times daily.   Yes Historical Provider, MD  gabapentin (NEURONTIN) 300 MG capsule Take 300 mg by mouth 3 (three) times daily. Reported on 01/06/2016   Yes Historical Provider, MD   hydrALAZINE (APRESOLINE) 50 MG tablet Take 50 mg by mouth 2 (two) times daily.   Yes Historical Provider, MD  insulin aspart (NOVOLOG FLEXPEN) 100 UNIT/ML FlexPen 2-8 Units 3 (three) times daily with meals.  09/13/15  Yes Historical Provider, MD  Insulin Glargine (TOUJEO SOLOSTAR) 300 UNIT/ML SOPN Inject 20 Units into the skin at bedtime. Patient taking differently: Inject 24 Units into the skin at bedtime.  12/21/15  Yes Theodoro Grist, MD  levothyroxine (SYNTHROID, LEVOTHROID) 50 MCG tablet Take 50 mcg by mouth daily before breakfast.   Yes Historical Provider, MD  lovastatin (MEVACOR) 40 MG tablet Take 40 mg by mouth at bedtime.   Yes Historical Provider, MD  metolazone (ZAROXOLYN) 5 MG tablet Take 5 mg by mouth daily.    Yes Historical Provider, MD  oxyCODONE-acetaminophen (ROXICET) 5-325 MG tablet Take 1 tablet by mouth every 6 (six) hours as needed for severe pain. 05/04/16  Yes Schuyler Amor, MD  Peritoneal Dialysis Solutions (DIALYSIS SOLUTION 1.5% LOW-MG/LOW-CA) dianeal solution Inject 1,000 mLs into the peritoneum daily. 12/21/15  Yes Theodoro Grist, MD  sodium bicarbonate 650 MG tablet Take 1,300 mg by mouth 2 (two) times daily.   Yes Historical Provider, MD  vitamin B-12 (CYANOCOBALAMIN) 1000 MCG tablet Take 1,000 mcg by mouth daily.   Yes Historical Provider, MD  gentamicin cream (GARAMYCIN) 0.1 % Apply 1 application topically daily. Patient not taking: Reported on 07/16/2016 12/21/15   Theodoro Grist, MD    Inpatient Medications:  . amLODipine  5 mg Oral Daily  . aspirin  81 mg Oral Daily  . calcium acetate  2,001 mg Oral TID WC  . dialysis solution 2.5% low-MG/low-CA   Intraperitoneal Q24H  . folic acid-vitamin b complex-vitamin c-selenium-zinc  1 tablet Oral Daily  . furosemide  80 mg Oral BID  . gabapentin  300 mg Oral TID  . gentamicin cream  1 application Topical Daily  . insulin aspart  0-15 Units Subcutaneous Q4H  . Insulin Glargine  10 Units Subcutaneous QHS  . [START ON  07/17/2016] levothyroxine  50 mcg Oral QAC breakfast  . metolazone  5 mg Oral Daily  . pravastatin  40 mg Oral q1800  . sodium bicarbonate  1,300 mg Oral BID  . vitamin B-12  1,000 mcg Oral Daily   . sodium chloride    . furosemide    . heparin 700 Units/hr (07/16/16 1145)  . nitroGLYCERIN      Allergies:  Allergies  Allergen Reactions  . Penicillins     Social History   Social History  . Marital status: Married    Spouse name: N/A  . Number of children: N/A  . Years of education: N/A   Occupational History  . Not on file.   Social History Main Topics  . Smoking status: Former Research scientist (life sciences)  . Smokeless tobacco: Never Used  . Alcohol use No  . Drug use: No  . Sexual activity: Yes   Other Topics Concern  . Not on file   Social  History Narrative  . No narrative on file     No family history on file.   Review of Systems Positive forShortness of breath chest pressure Negative for: General:  chills, fever, night sweats or weight changes.  Cardiovascular: PND orthopnea syncope dizziness  Dermatological skin lesions rashes Respiratory: Cough congestion Urologic: Frequent urination urination at night and hematuria Abdominal: negative for nausea, vomiting, diarrhea, bright red blood per rectum, melena, or hematemesis Neurologic: negative for visual changes, and/or hearing changes  All other systems reviewed and are otherwise negative except as noted above.  Labs:  Recent Labs  07/16/16 0949  TROPONINI 0.16*   Lab Results  Component Value Date   WBC 11.1 (H) 07/16/2016   HGB 10.2 (L) 07/16/2016   HCT 30.0 (L) 07/16/2016   MCV 90.6 07/16/2016   PLT 205 07/16/2016    Recent Labs Lab 07/16/16 0949  NA 140  K 5.0  CL 104  CO2 22  BUN 92*  CREATININE 13.85*  CALCIUM 7.3*  GLUCOSE 120*   No results found for: CHOL, HDL, LDLCALC, TRIG No results found for: DDIMER  Radiology/Studies:  Ct Angio Chest Pe W And/or Wo Contrast  Result Date:  07/16/2016 CLINICAL DATA:  Chest pressure, shortness of breath, cough, onset of symptoms yesterday, nausea, became diaphoretic and tachypneic in ED, end-stage renal disease on dialysis, diabetes mellitus, hypertension EXAM: CT ANGIOGRAPHY CHEST WITH CONTRAST TECHNIQUE: Multidetector CT imaging of the chest was performed using the standard protocol during bolus administration of intravenous contrast. Multiplanar CT image reconstructions and MIPs were obtained to evaluate the vascular anatomy. CONTRAST:  75 cc Isovue 370 IV COMPARISON:  None FINDINGS: Cardiovascular: Atherosclerotic calcifications aorta, proximal great vessels and coronary arteries. Aorta normal caliber without aneurysm or dissection. No pericardial effusion. Pulmonary arteries well opacified and patent. No evidence of pulmonary embolism. Cardiac chambers appear mildly enlarged. Mediastinum/Nodes: 10 mm prevascular node image 22. Few additional normal sized mediastinal nodes. No additional thoracic adenopathy. Esophagus unremarkable. Base of cervical region normal appearance. Lungs/Pleura: BILATERAL pleural effusions and compressive atelectasis of the adjacent lungs. Infiltrates identified centrally in in all lobes question pulmonary edema though infection not excluded. No pneumothorax. Upper Abdomen: Unremarkable Musculoskeletal: Old LEFT rib fractures.  No acute osseous findings. Review of the MIP images confirms the above findings. IMPRESSION: No evidence of pulmonary embolism. Aortic atherosclerosis and coronary arterial calcification. BILATERAL pleural effusions and central infiltrates throughout all lobes, favoring pulmonary edema over infection. Electronically Signed   By: Lavonia Dana M.D.   On: 07/16/2016 11:23   Dg Chest Portable 1 View  Result Date: 07/16/2016 CLINICAL DATA:  Shortness of breath, chest pressure, cough and nausea beginning today. EXAM: PORTABLE CHEST 1 VIEW COMPARISON:  PA and lateral chest 12/08/2015. FINDINGS: There  is cardiomegaly and pulmonary edema. No pneumothorax or pleural effusion. No acute bony abnormality. IMPRESSION: Congestive heart failure. Electronically Signed   By: Inge Rise M.D.   On: 07/16/2016 10:34    EKG: Normal sinus rhythm  Weights: Filed Weights   07/16/16 0952  Weight: 63.5 kg (140 lb)     Physical Exam: Blood pressure (!) 164/69, pulse (!) 105, temperature 98 F (36.7 C), temperature source Oral, resp. rate (!) 27, height 5' (1.524 m), weight 63.5 kg (140 lb), SpO2 100 %. Body mass index is 27.34 kg/m. General: Well developed, well nourished, in no acute distress. Head eyes ears nose throat: Normocephalic, atraumatic, sclera non-icteric, no xanthomas, nares are without discharge. No apparent thyromegaly and/or mass  Lungs: Normal  respiratory effort.Few wheezes, basilar rales and decreased basilar breath sounds, no rhonchi.  Heart: RRR with normal S1 S2. no murmur gallop, no rub, PMI is normal size and placement, carotid upstroke normal without bruit, jugular venous pressure is normal Abdomen: Soft, non-tender,  distended with normoactive bowel sounds. No hepatomegaly. No rebound/guarding. No obvious abdominal masses. Abdominal aorta is normal size without bruit Extremities:Trace edema. no cyanosis, no clubbing, no ulcers  Peripheral : 2+ bilateral upper extremity pulses, 2+ bilateral femoral pulses, 2+ bilateral dorsal pedal pulse Neuro: Alert and oriented. No facial asymmetry. No focal deficit. Moves all extremities spontaneously. Musculoskeletal: Normal muscle tone without kyphosis Psych:  Responds to questions appropriately with a normal affect.    Assessment: 65 year old female with end-stage renal disease on dialysis with hypertension mixed hyperlipidemia diabetes with complication having chest pressure and pain without current evidence of myocardial infarction or EKG changes but pulmonary edema consistent with possible heart failure  Plan: 1. Intravenous  Lasix and/or further dialysis or combination of the 2 for pulmonary edema and congestive heart failure 2. BiPAP machine for forced oxygen for hypoxia and reduction of cardiovascular stress 3. Amlodipine for hypertension control as before 4. Heparin for further risk reduction in myocardial infarction and/or unstable angina 5. Aspirin 81 mg for further treatment of possible vascular disease 6. Echocardiogram for LV systolic dysfunction valvular heart disease contributing to above 7. Further diagnostic testing and treatment options after above  Signed, Corey Skains M.D. [facility] 07/16/2016, 1:36 PM

## 2016-07-16 NOTE — Progress Notes (Signed)
ANTICOAGULATION CONSULT NOTE - Initial Consult  Pharmacy Consult for Heparin Indication: chest pain/ACS  Allergies  Allergen Reactions  . Penicillins     Patient Measurements: Height: 5' (152.4 cm) Weight: 140 lb (63.5 kg) IBW/kg (Calculated) : 45.5 Heparin Dosing Weight: 58.9 kg  Vital Signs: Temp: 98 F (36.7 C) (12/24 0952) Temp Source: Oral (12/24 0952) BP: 191/87 (12/24 1041) Pulse Rate: 124 (12/24 1037)  Labs:  Recent Labs  07/16/16 0949  HGB 10.2*  HCT 30.0*  PLT 205  CREATININE 13.85*  TROPONINI 0.16*    Estimated Creatinine Clearance: 3.3 mL/min (by C-G formula based on SCr of 13.85 mg/dL (H)).   Medical History: Past Medical History:  Diagnosis Date  . Anemia   . Chronic kidney disease    peritoneal dialysis  . Complication of anesthesia   . Diabetes mellitus without complication ([place])   . Dyspnea    doe  . Edema   . Hypercholesterolemia   . Hypertension   . Hypothyroidism   . Neuropathy ([place])   . PONV (postoperative nausea and vomiting)   . Shingles    3 weeks ago  . Tremors of nervous system    intermittent when taking gabapentin     Assessment: 65 yo female here with SOB and chest pressure starting on heparin drip for possible ACS. No oral anticoagulants noted on PTA med list.   INR and aPTT in process Hgb 10.2, Plt 205   Goal of Therapy:  Heparin level 0.3-0.7 units/ml Monitor platelets by anticoagulation protocol: Yes   Plan:  Will bolus heparin 3500 units IV x1 then start heparin drip at 700 units/hr.  Heparin level in 8h CBC in AM  Farhad Burleson L 07/16/2016,11:12 AM

## 2016-07-16 NOTE — H&P (Signed)
PULMONARY / CRITICAL CARE MEDICINE   Name: Bethany Lee MRN: [id number] DOB: 1950/07/29    ADMISSION DATE:  07/16/2016  PT PROFILE:   62 F with ESRD due to DM and hypertension, on peritoneal dialysis since 01/2015 admitted with CP and respiratory distress due to pulmonary edema  HISTORY OF PRESENT ILLNESS:   As above. She was in [place] until evening prior to admission when she developed squeezing CP across her lower chest and diaphoresis. She also notes orthopnea and increasing dyspnea progressing to respiratory distress prompting evaluation to ED where she was noted to be in distress and BiPAP was initiated. She indicates that the chest pain is no longer present. She is comfortable on BiPAP. She has undergone CXR and CT chest both of which are c/w pulmonary edema. She denies fever, purulent sputum, hemoptysis, LE edema and calf tenderness.   She does still make some urine and is on furosemide and metolazone chronically.   MAJOR EVENTS/TEST RESULTS: 12/24 CT chest: no PE. bilat effusions and bilateral central alveolar infiltrates c/w pulmonary edema 12/24 Echocardiogram 12/24 Nephrology consultation for mgmt of dialysis 12/24 Cardiology consultation  INDWELLING DEVICES::   MICRO DATA:   ANTIMICROBIALS:     PAST MEDICAL HISTORY :  She  has a past medical history of Anemia; Chronic kidney disease; Complication of anesthesia; Diabetes mellitus without complication ([place]); Dyspnea; Edema; Hypercholesterolemia; Hypertension; Hypothyroidism; Neuropathy ([place]); PONV (postoperative nausea and vomiting); Shingles; and Tremors of nervous system.  PAST SURGICAL HISTORY: She  has a past surgical history that includes Abdominal hysterectomy; Tubal ligation; Eye surgery; CAPD insertion (N/A, 11/25/2015); CAPD insertion (N/A, 01/06/2016); and Cataract extraction w/PHACO (Right, 05/25/2016).  Allergies  Allergen Reactions  . Penicillins     No current facility-administered medications on  file prior to encounter.    Current Outpatient Prescriptions on File Prior to Encounter  Medication Sig  . amLODipine (NORVASC) 5 MG tablet Take 5 mg by mouth daily.  Marland Kitchen aspirin EC 81 MG tablet Take 1 tablet (81 mg total) by mouth daily. Start taking after NOV 15  . calcium acetate (PHOSLO) 667 MG capsule Take 2,001 mg by mouth 3 (three) times daily with meals.  . docusate sodium (COLACE) 100 MG capsule Take 1 capsule (100 mg total) by mouth daily as needed.  . folic acid-vitamin b complex-vitamin c-selenium-zinc (DIALYVITE) 3 MG TABS tablet Take 1 tablet by mouth daily.  . furosemide (LASIX) 80 MG tablet Take 80 mg by mouth 2 (two) times daily.  Marland Kitchen gabapentin (NEURONTIN) 300 MG capsule Take 300 mg by mouth 3 (three) times daily. Reported on 01/06/2016  . hydrALAZINE (APRESOLINE) 50 MG tablet Take 50 mg by mouth 2 (two) times daily.  . insulin aspart (NOVOLOG FLEXPEN) 100 UNIT/ML FlexPen 2-8 Units 3 (three) times daily with meals.   . Insulin Glargine (TOUJEO SOLOSTAR) 300 UNIT/ML SOPN Inject 20 Units into the skin at bedtime. (Patient taking differently: Inject 24 Units into the skin at bedtime. )  . levothyroxine (SYNTHROID, LEVOTHROID) 50 MCG tablet Take 50 mcg by mouth daily before breakfast.  . lovastatin (MEVACOR) 40 MG tablet Take 40 mg by mouth at bedtime.  . metolazone (ZAROXOLYN) 5 MG tablet Take 5 mg by mouth daily.   Marland Kitchen oxyCODONE-acetaminophen (ROXICET) 5-325 MG tablet Take 1 tablet by mouth every 6 (six) hours as needed for severe pain.  Marland Kitchen Peritoneal Dialysis Solutions (DIALYSIS SOLUTION 1.5% LOW-MG/LOW-CA) dianeal solution Inject 1,000 mLs into the peritoneum daily.  . sodium bicarbonate 650 MG tablet Take  1,300 mg by mouth 2 (two) times daily.  . vitamin B-12 (CYANOCOBALAMIN) 1000 MCG tablet Take 1,000 mcg by mouth daily.  Marland Kitchen gentamicin cream (GARAMYCIN) 0.1 % Apply 1 application topically daily. (Patient not taking: Reported on 07/16/2016)    FAMILY HISTORY:  Her has no family  status information on file.    SOCIAL HISTORY: She  reports that she has quit smoking. She has never used smokeless tobacco. She reports that she does not drink alcohol or use drugs.  REVIEW OF SYSTEMS:   No fever, chills, sinus symptoms, HA, neck pain or stiffness, cough or sputum, N/V/D, dysuria  SUBJECTIVE:    VITAL SIGNS: BP (!) 166/69   Pulse (!) 112   Temp 98 F (36.7 C) (Oral)   Resp (!) 23   Ht 5' (1.524 m)   Wt 140 lb (63.5 kg)   SpO2 100%   BMI 27.34 kg/m   HEMODYNAMICS:    VENTILATOR SETTINGS:    INTAKE / OUTPUT: No intake/output data recorded.  PHYSICAL EXAMINATION: General: WDWN, well supported on BiPAP, cognition intact Neuro: CNs intact, motor/sens intact, DTRs symmetric HEENT: NCAL, sclera white, oropharynx normal Cardiovascular: tachy, regular, no M Lungs: diffuse bilateral crackles, no wheezes Abdomen: Soft, NT, +BS Ext: minimal pretibial edema - symmetric Skin: no lesions noted  LABS:  BMET  Recent Labs Lab 07/16/16 0949  NA 140  K 5.0  CL 104  CO2 22  BUN 92*  CREATININE 13.85*  GLUCOSE 120*    Electrolytes  Recent Labs Lab 07/16/16 0949  CALCIUM 7.3*    CBC  Recent Labs Lab 07/16/16 0949  WBC 11.1*  HGB 10.2*  HCT 30.0*  PLT 205    Coag's  Recent Labs Lab 07/16/16 0952  APTT 33  INR 0.95    Sepsis Markers No results for input(s): LATICACIDVEN, PROCALCITON, O2SATVEN in the last 168 hours.  ABG  Recent Labs Lab 07/16/16 1035  PHART 7.27*  PCO2ART 38  PO2ART 113*    Liver Enzymes No results for input(s): AST, ALT, ALKPHOS, BILITOT, ALBUMIN in the last 168 hours.  Cardiac Enzymes  Recent Labs Lab 07/16/16 0949  TROPONINI 0.16*    Glucose  Recent Labs Lab 07/16/16 1001  GLUCAP 134*    CXR: edema pattern  EKG: sinus tach, PRWP, NSST changes   ASSESSMENT / PLAN:  PULMONARY A: Acute hypoxic respiratory failure Pulmonary edema P:   PRN BiPAP Supplemental O2 IV Lasix ordered  X 1 NTG gtt which should help preload reduction and pulmonary edema  CARDIOVASCULAR A:  Chest pain - description c/w angina Sinus tachycardia Mildly elevated trop I P:  Cardiology consultation requested Echocardiogram ordered Serial cardiac markers NTG gtt ASA ordered Low dose metoprolol PRN to keep HR < 115/min  RENAL A:   ESRD, chronic PD P:   Renal consultation requested Monitor BMET intermittently Monitor I/Os Correct electrolytes as indicated  GASTROINTESTINAL A:   No issues P:   SUP: N/I NPO until resp status improves  HEMATOLOGIC A:   Chronic anemia of renal disease P:  DVT px: full dose heparin Monitor CBC intermittently Transfuse per usual guidelines   INFECTIOUS A:   No issues P:   Monitor temp, WBC count Micro and abx as above  ENDOCRINE A:   DM 2 - chronic Lantus and SSI P:   Reduced dose Lantus while NPO SSI - moderate scale q 4 hrs  NEUROLOGIC A:   No issues P:   RASS goal: 0 PRN analgesics  FAMILY: Pt and family updated @ bedside in ED  CCM X 40 mins  Merton Border, MD PCCM service Mobile [phone] Pager [phone] 07/16/2016    07/16/2016, 12:46 PM

## 2016-07-16 NOTE — ED Provider Notes (Signed)
[facility] Emergency Department Provider Note  ____________________________________________  Time seen: Approximately 11:45 AM  I have reviewed the triage vital signs and the nursing notes.   HISTORY  Chief Complaint Shortness of Breath and Chest Pain Level 5 caveat:  Portions of the history and physical were unable to be obtained due to the patient's acute illness    HPI Bethany Lee is a 65 y.o. female brought to the ED due to shortness of breath and chest pain. All started yesterday but worsened today. She has a nonproductive cough. She has a history of hypertension diabetes and end-stage renal disease on peritoneal dialysis.     Past Medical History:  Diagnosis Date  . Anemia   . Chronic kidney disease    peritoneal dialysis  . Complication of anesthesia   . Diabetes mellitus without complication ([place])   . Dyspnea    doe  . Edema   . Hypercholesterolemia   . Hypertension   . Hypothyroidism   . Neuropathy ([place])   . PONV (postoperative nausea and vomiting)   . Shingles    3 weeks ago  . Tremors of nervous system    intermittent when taking gabapentin     Patient Active Problem List   Diagnosis Date Noted  . ESRD on dialysis ([place]) 12/21/2015  . Peritoneal dialysis status ([place]) 12/21/2015  . Leg swelling 12/21/2015  . Anemia of chronic disease 12/21/2015  . Diabetes mellitus ([place]) 12/21/2015  . Hyperkalemia 12/21/2015  . 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 Anemia in chronic renal disease 10/04/2015  . Proteinuria 05/25/2015     Past Surgical History:  Procedure Laterality Date  . ABDOMINAL HYSTERECTOMY    . CAPD INSERTION N/A 11/25/2015   Procedure: LAPAROSCOPIC INSERTION CONTINUOUS AMBULATORY PERITONEAL DIALYSIS  (CAPD) CATHETER;  Surgeon: Algernon Huxley, MD;  Location: [facility] ORS;  Service: Vascular;  Laterality: N/A;  . CAPD INSERTION N/A 01/06/2016   Procedure: LAPAROSCOPIC INSERTION CONTINUOUS AMBULATORY PERITONEAL DIALYSIS   (CAPD) CATHETER REVISION ;  Surgeon: Algernon Huxley, MD;  Location: [facility] ORS;  Service: Vascular;  Laterality: N/A;  . CATARACT EXTRACTION W/PHACO Right 05/25/2016   Procedure: CATARACT EXTRACTION PHACO AND INTRAOCULAR LENS PLACEMENT ([place]);  Surgeon: Eulogio Bear, MD;  Location: [facility] ORS;  Service: Ophthalmology;  Laterality: Right;  Lot # [id number] H [place]: 00:5.2 AP%: 11.3 CDE: 7.21  . EYE SURGERY    . TUBAL LIGATION       Prior to Admission medications   Medication Sig Start Date End Date Taking? Authorizing Provider  amLODipine (NORVASC) 5 MG tablet Take 5 mg by mouth daily.   Yes Historical Provider, MD  aspirin EC 81 MG tablet Take 1 tablet (81 mg total) by mouth daily. Start taking after NOV 15 05/26/15  Yes Harmeet Candiss Norse, MD  calcium acetate (PHOSLO) 667 MG capsule Take 2,001 mg by mouth 3 (three) times daily with meals.   Yes Historical Provider, MD  docusate sodium (COLACE) 100 MG capsule Take 1 capsule (100 mg total) by mouth daily as needed. 05/04/16 05/04/17 Yes Schuyler Amor, MD  folic acid-vitamin b complex-vitamin c-selenium-zinc (DIALYVITE) 3 MG TABS tablet Take 1 tablet by mouth daily.   Yes Historical Provider, MD  furosemide (LASIX) 80 MG tablet Take 80 mg by mouth 2 (two) times daily.   Yes Historical Provider, MD  gabapentin (NEURONTIN) 300 MG capsule Take 300 mg by mouth 3 (three) times daily. Reported on 01/06/2016   Yes Historical Provider, MD  hydrALAZINE (APRESOLINE)  50 MG tablet Take 50 mg by mouth 2 (two) times daily.   Yes Historical Provider, MD  insulin aspart (NOVOLOG FLEXPEN) 100 UNIT/ML FlexPen 2-8 Units 3 (three) times daily with meals.  09/13/15  Yes Historical Provider, MD  Insulin Glargine (TOUJEO SOLOSTAR) 300 UNIT/ML SOPN Inject 20 Units into the skin at bedtime. Patient taking differently: Inject 24 Units into the skin at bedtime.  12/21/15  Yes Theodoro Grist, MD  levothyroxine (SYNTHROID, LEVOTHROID) 50 MCG tablet Take 50 mcg by mouth daily before  breakfast.   Yes Historical Provider, MD  lovastatin (MEVACOR) 40 MG tablet Take 40 mg by mouth at bedtime.   Yes Historical Provider, MD  metolazone (ZAROXOLYN) 5 MG tablet Take 5 mg by mouth daily.    Yes Historical Provider, MD  oxyCODONE-acetaminophen (ROXICET) 5-325 MG tablet Take 1 tablet by mouth every 6 (six) hours as needed for severe pain. 05/04/16  Yes Schuyler Amor, MD  Peritoneal Dialysis Solutions (DIALYSIS SOLUTION 1.5% LOW-MG/LOW-CA) dianeal solution Inject 1,000 mLs into the peritoneum daily. 12/21/15  Yes Theodoro Grist, MD  sodium bicarbonate 650 MG tablet Take 1,300 mg by mouth 2 (two) times daily.   Yes Historical Provider, MD  vitamin B-12 (CYANOCOBALAMIN) 1000 MCG tablet Take 1,000 mcg by mouth daily.   Yes Historical Provider, MD  gentamicin cream (GARAMYCIN) 0.1 % Apply 1 application topically daily. Patient not taking: Reported on 07/16/2016 12/21/15   Theodoro Grist, MD     Allergies Penicillins   No family history on file.  Social History Social History  Substance Use Topics  . Smoking status: Former Research scientist (life sciences)  . Smokeless tobacco: Never Used  . Alcohol use No    Review of Systems  Constitutional:   No fever or chills.  ENT:   No sore throat. No rhinorrhea. Cardiovascular:   Positive chest pain. Respiratory:  Positive shortness of breath and cough. Gastrointestinal:   Negative for abdominal pain, vomiting and diarrhea.  Genitourinary:   Negative for dysuria or difficulty urinating. Musculoskeletal:   Negative for focal pain or swelling Neurological:   Negative for headaches 10-point ROS otherwise negative.  ____________________________________________   PHYSICAL EXAM:  VITAL SIGNS: ED Triage Vitals  Enc Vitals Group     BP 07/16/16 0952 (!) 201/94     Pulse Rate 07/16/16 0952 (!) 108     Resp 07/16/16 0952 20     Temp 07/16/16 0952 98 F (36.7 C)     Temp Source 07/16/16 0952 Oral     SpO2 07/16/16 0952 90 %     Weight 07/16/16 0952 140 lb  (63.5 kg)     Height 07/16/16 0952 5' (1.524 m)     Head Circumference --      Peak Flow --      Pain Score 07/16/16 1032 10     Pain Loc --      Pain Edu? --      Excl. in [place]? --     Vital signs reviewed, nursing assessments reviewed.   Constitutional:   Alert and oriented. Severe respiratory distress. Eyes:   No scleral icterus. No conjunctival pallor. PERRL. EOMI.  No nystagmus. ENT   Head:   Normocephalic and atraumatic.   Nose:   No congestion/rhinnorhea. No septal hematoma   Mouth/Throat:   MMM, no pharyngeal erythema. No peritonsillar mass.    Neck:   No stridor. No SubQ emphysema. No meningismus. Hematological/Lymphatic/Immunilogical:   No cervical lymphadenopathy. Cardiovascular:   Tachycardia heart rate 120. Symmetric bilateral  radial and DP pulses.  No murmurs.  Respiratory:   Tachypnea, retractions and belly breathing. Good air entry in all lung fields but very wheezy and prolonged expiratory phase. Oxygen saturation 84% on 4 L nasal cannula, increases to 91% on nonrebreather. Gastrointestinal:   Soft and nontender. Non distended. There is no CVA tenderness.  No rebound, rigidity, or guarding. Genitourinary:   deferred Musculoskeletal:   Nontender with normal range of motion in all extremities. No joint effusions.  No lower extremity tenderness.  No edema. Neurologic:   Normal speech and language.  CN 2-10 normal. Motor grossly intact. No gross focal neurologic deficits are appreciated.  Skin:    Skin is warm, dry and intact. No rash noted.  No petechiae, purpura, or bullae.  ____________________________________________    LABS (pertinent positives/negatives) (all labs ordered are listed, but only abnormal results are displayed) Labs Reviewed  CBC - Abnormal; Notable for the following:       Result Value   WBC 11.1 (*)    RBC 3.31 (*)    Hemoglobin 10.2 (*)    HCT 30.0 (*)    RDW 15.1 (*)    All other components within normal limits  TROPONIN I -  Abnormal; Notable for the following:    Troponin I 0.16 (*)    All other components within normal limits  BASIC METABOLIC PANEL - Abnormal; Notable for the following:    Glucose, Bld 120 (*)    BUN 92 (*)    Creatinine, Ser 13.85 (*)    Calcium 7.3 (*)    GFR calc non Af Amer 2 (*)    GFR calc Af Amer 3 (*)    All other components within normal limits  BRAIN NATRIURETIC PEPTIDE - Abnormal; Notable for the following:    B Natriuretic Peptide 1,586.0 (*)    All other components within normal limits  BLOOD GAS, ARTERIAL - Abnormal; Notable for the following:    pH, Arterial 7.27 (*)    pO2, Arterial 113 (*)    Bicarbonate 17.4 (*)    Acid-base deficit 8.8 (*)    All other components within normal limits  GLUCOSE, CAPILLARY - Abnormal; Notable for the following:    Glucose-Capillary 134 (*)    All other components within normal limits  APTT  PROTIME-INR  HEPARIN LEVEL (UNFRACTIONATED)   ____________________________________________   EKG  Interpreted by me Sinus tachycardia rate 108, normal axis intervals QRS ST segments and T waves.  ____________________________________________    RADIOLOGY  Chest x-ray consistent with pulmonary edema. CT angiogram negative for PE, consistent with pulmonary edema.  ____________________________________________   PROCEDURES Procedures CRITICAL CARE Performed by: Joni Fears, Roniya Tetro   Total critical care time: 35 minutes  Critical care time was exclusive of separately billable procedures and treating other patients.  Critical care was necessary to treat or prevent imminent or life-threatening deterioration.  Critical care was time spent personally by me on the following activities: development of treatment plan with patient and/or surrogate as well as nursing, discussions with consultants, evaluation of patient's response to treatment, examination of patient, obtaining history from patient or surrogate, ordering and performing  treatments and interventions, ordering and review of laboratory studies, ordering and review of radiographic studies, pulse oximetry and re-evaluation of patient's condition.  ____________________________________________   INITIAL IMPRESSION / ASSESSMENT AND PLAN / ED COURSE  Pertinent labs & imaging results that were available during my care of the patient were reviewed by me and considered in my medical decision making (  see chart for details).  Patient presents with respiratory distress. Solu-Medrol, DuoNeb and albuterol. Magnesium. Stat chest x-ray.     Clinical Course as of Jul 16 1144  Sun Jul 16, 2016  1046 Breathing very labored, only slightly improved with nebs/steroids. Bipap started. Trop elevated. Presentation concerning for nstemi vs central PE. Will obtain CTA chest.  Has ESRD, irreversible.   [PS]  1047 Pain 9/10 after 3 nitros SL. Start heparin and nitro drips.   [PS]    Clinical Course User Index [PS] Carrie Mew, MD    ----------------------------------------- 11:57 AM on 07/16/2016 -----------------------------------------  CT images reviewed, result obtained, negative for PE, again consistent with acute pulmonary edema. Chest pain at that point at 11:30 AM had resolved, so we will discontinue the nitro drip or. Start heparin for non-STEMI. Case discussed with the intensivist for admission. ____________________________________________   FINAL CLINICAL IMPRESSION(S) / ED DIAGNOSES  Final diagnoses:  Acute pulmonary edema (HCC)  NSTEMI (non-ST elevated myocardial infarction) (HCC)  Precordial pain  Acute respiratory failure with hypoxia [facility])      New Prescriptions   No medications on file     Portions of this note were generated with dragon dictation software. Dictation errors may occur despite best attempts at proofreading.    Carrie Mew, MD 07/16/16 [phone]

## 2016-07-16 NOTE — Progress Notes (Signed)
ANTICOAGULATION CONSULT NOTE - Initial Consult  Pharmacy Consult for Heparin Indication: chest pain/ACS  Allergies  Allergen Reactions  . Penicillins     Patient Measurements: Height: 5' (152.4 cm) Weight: 150 lb 5.7 oz (68.2 kg) IBW/kg (Calculated) : 45.5 Heparin Dosing Weight: 58.9 kg  Vital Signs: Temp: 98.4 F (36.9 C) (12/24 1940) Temp Source: Oral (12/24 1940) BP: 120/56 (12/24 2230) Pulse Rate: 82 (12/24 2230)  Labs:  Recent Labs  07/16/16 0949 07/16/16 0952 07/16/16 1415 07/16/16 1827 07/16/16 2130  HGB 10.2*  --   --   --   --   HCT 30.0*  --   --   --   --   PLT 205  --   --   --   --   APTT  --  33  --   --   --   LABPROT  --  12.7  --   --   --   INR  --  0.95  --   --   --   HEPARINUNFRC  --   --   --   --  <0.10*  CREATININE 13.85*  --   --   --   --   TROPONINI 0.16*  --  0.15* 0.14*  --     Estimated Creatinine Clearance: 3.4 mL/min (by C-G formula based on SCr of 13.85 mg/dL (H)).   Medical History: Past Medical History:  Diagnosis Date  . Anemia   . Chronic kidney disease    peritoneal dialysis  . Complication of anesthesia   . Diabetes mellitus without complication ([place])   . Dyspnea    doe  . Edema   . Hypercholesterolemia   . Hypertension   . Hypothyroidism   . Neuropathy ([place])   . PONV (postoperative nausea and vomiting)   . Shingles    3 weeks ago  . Tremors of nervous system    intermittent when taking gabapentin     Assessment: 65 yo female here with SOB and chest pressure starting on heparin drip for possible ACS. No oral anticoagulants noted on PTA med list.   INR and aPTT in process Hgb 10.2, Plt 205   Goal of Therapy:  Heparin level 0.3-0.7 units/ml Monitor platelets by anticoagulation protocol: Yes   Plan:  Will bolus heparin 3500 units IV x1 then start heparin drip at 700 units/hr.  Heparin level in 8h CBC in AM  12/24 2130 HL subtherapeutic. RN states drip is currently running and day shift didn't say  anything about stopping it. 1800 units IV x 1 bolus and increase rate to 900 units/hr. Will recheck in 8 hours.  Laural Benes, Pharm.D., BCPS Clinical Pharmacist 07/16/2016,10:59 PM

## 2016-07-16 NOTE — Progress Notes (Signed)
CCPD initiated without issue. Exit site care performed. Pt currently on bipap resting comfortably. Report given to Orson Gear RN.

## 2016-07-16 NOTE — ED Triage Notes (Signed)
Pt came to ED via EMS c/o sob, chest pressure starting yesterday. Pt reports cough, denies fever, felt nauseous. Denies history of respiratory problems. Satting 90% r/a. HR 109.

## 2016-07-16 NOTE — Progress Notes (Signed)
Pre CCPD

## 2016-07-16 NOTE — ED Notes (Signed)
Patient transported to CT by this RN and Angie, RT

## 2016-07-16 NOTE — Progress Notes (Signed)
[facility]  ROUNDING NOTE   Subjective:  Patient well known to [place] as we follow her as an outpatient. She presented now with significant shortness of breath and chest pain. She normally performs peritoneal dialysis 6 nights a week. Last night she did not perform peritoneal dialysis. She states that she had a potentially salty soup to eat. In addition she did not take her antihypertensives yesterday. Patient is in acute respiratory failure now and is requiring BiPAP. She is being admitted to the critical care unit. We plan for peritoneal dialysis now.   Objective:  Vital signs in last 24 hours:  Temp:  [98 F (36.7 C)] 98 F (36.7 C) (12/24 0952) Pulse Rate:  [104-124] 105 (12/24 1330) Resp:  [17-39] 27 (12/24 1330) BP: (122-201)/(64-130) 164/69 (12/24 1330) SpO2:  [81 %-100 %] 100 % (12/24 1330) Weight:  [63.5 kg (140 lb)] 63.5 kg (140 lb) (12/24 0952)  Weight change:  Filed Weights   07/16/16 0952  Weight: 63.5 kg (140 lb)    Intake/Output: No intake/output data recorded.   Intake/Output this shift:  No intake/output data recorded.  Physical Exam: General: Critically ill appearing, on bipap  Head: Normocephalic, atraumatic. Moist oral mucosal membranes  Eyes: Anicteric  Neck: Supple, trachea midline  Lungs:  Bilateral rales up to mid lung zones, on bipap  Heart: S1S2 no rubs  Abdomen:  Soft, nontender, bowel sounds present  Extremities: 1+ peripheral edema.  Neurologic: Nonfocal, moving all four extremities  Skin: No lesions  Access: PD catheter in place    Basic Metabolic Panel:  Recent Labs Lab 07/16/16 0949  NA 140  K 5.0  CL 104  CO2 22  GLUCOSE 120*  BUN 92*  CREATININE 13.85*  CALCIUM 7.3*    Liver Function Tests: No results for input(s): AST, ALT, ALKPHOS, BILITOT, PROT, ALBUMIN in the last 168 hours. No results for input(s): LIPASE, AMYLASE in the last 168 hours. No results for input(s): AMMONIA in the last 168  hours.  CBC:  Recent Labs Lab 07/16/16 0949  WBC 11.1*  HGB 10.2*  HCT 30.0*  MCV 90.6  PLT 205    Cardiac Enzymes:  Recent Labs Lab 07/16/16 0949  TROPONINI 0.16*    BNP: Invalid input(s): POCBNP  CBG:  Recent Labs Lab 07/16/16 1001  GLUCAP 134*    Microbiology: Results for orders placed or performed during the hospital encounter of 05/04/16  Urine culture     Status: Abnormal   Collection Time: 05/04/16  2:20 PM  Result Value Ref Range Status   Specimen Description URINE, RANDOM  Final   Special Requests NONE  Final   Culture (A)  Final    <10,000 COLONIES/mL INSIGNIFICANT GROWTH Performed at [facility]    Report Status 05/05/2016 FINAL  Final    Coagulation Studies:  Recent Labs  07/16/16 0952  LABPROT 12.7  INR 0.95    Urinalysis: No results for input(s): COLORURINE, LABSPEC, PHURINE, GLUCOSEU, HGBUR, BILIRUBINUR, KETONESUR, PROTEINUR, UROBILINOGEN, NITRITE, LEUKOCYTESUR in the last 72 hours.  Invalid input(s): APPERANCEUR    Imaging: Ct Angio Chest Pe W And/or Wo Contrast  Result Date: 07/16/2016 CLINICAL DATA:  Chest pressure, shortness of breath, cough, onset of symptoms yesterday, nausea, became diaphoretic and tachypneic in ED, end-stage renal disease on dialysis, diabetes mellitus, hypertension EXAM: CT ANGIOGRAPHY CHEST WITH CONTRAST TECHNIQUE: Multidetector CT imaging of the chest was performed using the standard protocol during bolus administration of intravenous contrast. Multiplanar CT image reconstructions and MIPs were  obtained to evaluate the vascular anatomy. CONTRAST:  75 cc Isovue 370 IV COMPARISON:  None FINDINGS: Cardiovascular: Atherosclerotic calcifications aorta, proximal great vessels and coronary arteries. Aorta normal caliber without aneurysm or dissection. No pericardial effusion. Pulmonary arteries well opacified and patent. No evidence of pulmonary embolism. Cardiac chambers appear mildly enlarged.  Mediastinum/Nodes: 10 mm prevascular node image 22. Few additional normal sized mediastinal nodes. No additional thoracic adenopathy. Esophagus unremarkable. Base of cervical region normal appearance. Lungs/Pleura: BILATERAL pleural effusions and compressive atelectasis of the adjacent lungs. Infiltrates identified centrally in in all lobes question pulmonary edema though infection not excluded. No pneumothorax. Upper Abdomen: Unremarkable Musculoskeletal: Old LEFT rib fractures.  No acute osseous findings. Review of the MIP images confirms the above findings. IMPRESSION: No evidence of pulmonary embolism. Aortic atherosclerosis and coronary arterial calcification. BILATERAL pleural effusions and central infiltrates throughout all lobes, favoring pulmonary edema over infection. Electronically Signed   By: Lavonia Dana M.D.   On: 07/16/2016 11:23   Dg Chest Portable 1 View  Result Date: 07/16/2016 CLINICAL DATA:  Shortness of breath, chest pressure, cough and nausea beginning today. EXAM: PORTABLE CHEST 1 VIEW COMPARISON:  PA and lateral chest 12/08/2015. FINDINGS: There is cardiomegaly and pulmonary edema. No pneumothorax or pleural effusion. No acute bony abnormality. IMPRESSION: Congestive heart failure. Electronically Signed   By: Inge Rise M.D.   On: 07/16/2016 10:34     Medications:   . sodium chloride    . furosemide    . heparin 700 Units/hr (07/16/16 1145)  . nitroGLYCERIN     . amLODipine  5 mg Oral Daily  . aspirin  81 mg Oral Daily  . calcium acetate  2,001 mg Oral TID WC  . dialysis solution 2.5% low-MG/low-CA   Intraperitoneal Q24H  . folic acid-vitamin b complex-vitamin c-selenium-zinc  1 tablet Oral Daily  . furosemide  80 mg Oral BID  . gabapentin  300 mg Oral TID  . gentamicin cream  1 application Topical Daily  . insulin aspart  0-15 Units Subcutaneous Q4H  . Insulin Glargine  10 Units Subcutaneous QHS  . [START ON 07/17/2016] levothyroxine  50 mcg Oral QAC breakfast   . metolazone  5 mg Oral Daily  . pravastatin  40 mg Oral q1800  . sodium bicarbonate  1,300 mg Oral BID  . vitamin B-12  1,000 mcg Oral Daily   sodium chloride, acetaminophen, docusate sodium, metoprolol, nitroGLYCERIN, ondansetron (ZOFRAN) IV, oxyCODONE-acetaminophen  Assessment/ Plan:  65 y.o. female with diabetes mellitus type 2, diabetic neuropathy, anemia, hypertension, hyperlipidemia, diabetic nephropathy, ESRD due to DM,  PD catheter placed 11/25/15, anemia of CKD, SHPTH, admission for respiratory failure 07/16/16  [facility]  1.  End-stage renal disease on peritoneal dialysis 6 nights a week. -Patient did not perform her peritoneal dialysis on the night prior to admission. In addition she did not take her antihypertensives. She now presents in volume overload. We plan to urgently start the patient on peritoneal dialysis now using 2.5% dextrose solution. If this is not effective in volume removal we may need to consider temporary hemodialysis. In addition the patient will likely need to perform peritoneal dialysis 7 nights a week.  2. Acute respiratory failure. Multifactorial with contributions from missed antihypertensives, ingestion of salty soup, and elevated blood pressure upon admission. Patient currently on BiPAP and appears to be tolerating well. Continue BiPAP for now. Peritoneal dialysis to be started as above.  3. Anemia of chronic kidney disease. Hemoglobin currently 10.2. She will continue with Epogen  as an outpatient.  4. Secondary hyperparathyroidism.  Continue PhosLo 3 tabs by mouth 3 times a day with meals. Periodically monitor phosphorus.   LOS: 0 Racquelle Hyser 12/24/20171:41 PM

## 2016-07-17 ENCOUNTER — Inpatient Hospital Stay
Admit: 2016-07-17 | Discharge: 2016-07-17 | Disposition: A | Discharge status: Home / Self Care | Payer: Commercial Managed Care - HMO | Attending: Pulmonary Disease | Admitting: Pulmonary Disease

## 2016-07-17 ENCOUNTER — Inpatient Hospital Stay: Payer: Commercial Managed Care - HMO

## 2016-07-17 DIAGNOSIS — Z794 Long term (current) use of insulin: Secondary | ICD-10-CM

## 2016-07-17 DIAGNOSIS — N186 End stage renal disease: Secondary | ICD-10-CM | POA: Diagnosis not present

## 2016-07-17 DIAGNOSIS — Z23 Encounter for immunization: Secondary | ICD-10-CM | POA: Diagnosis not present

## 2016-07-17 DIAGNOSIS — Z992 Dependence on renal dialysis: Secondary | ICD-10-CM | POA: Diagnosis not present

## 2016-07-17 DIAGNOSIS — I10 Essential (primary) hypertension: Secondary | ICD-10-CM

## 2016-07-17 LAB — BASIC METABOLIC PANEL
Anion gap: 11 (ref 5–15)
BUN: 88 mg/dL — AB (ref 6–20)
CALCIUM: 7.4 mg/dL — AB (ref 8.9–10.3)
CO2: 23 mmol/L (ref 22–32)
Chloride: 103 mmol/L (ref 101–111)
Creatinine, Ser: 13.34 mg/dL — ABNORMAL HIGH (ref 0.44–1.00)
GFR calc Af Amer: 3 mL/min — ABNORMAL LOW (ref >60)
GFR, EST NON AFRICAN AMERICAN: 2 mL/min — AB (ref >60)
GLUCOSE: 100 mg/dL — AB (ref 65–99)
Potassium: 5.1 mmol/L (ref 3.5–5.1)
Sodium: 137 mmol/L (ref 135–145)

## 2016-07-17 LAB — GLUCOSE, CAPILLARY
GLUCOSE-CAPILLARY: 101 mg/dL — AB (ref 65–99)
GLUCOSE-CAPILLARY: 177 mg/dL — AB (ref 65–99)
Glucose-Capillary: 166 mg/dL — ABNORMAL HIGH (ref 65–99)
Glucose-Capillary: 119 mg/dL — ABNORMAL HIGH (ref 65–99)
Glucose-Capillary: 142 mg/dL — ABNORMAL HIGH (ref 65–99)

## 2016-07-17 LAB — CBC
HCT: 21.6 % — ABNORMAL LOW (ref 35.0–47.0)
HEMOGLOBIN: 7.2 g/dL — AB (ref 12.0–16.0)
MCH: 30.2 pg (ref 26.0–34.0)
MCHC: 33.3 g/dL (ref 32.0–36.0)
MCV: 90.7 fL (ref 80.0–100.0)
PLATELETS: 183 10*3/uL (ref 150–440)
RBC: 2.39 MIL/uL — ABNORMAL LOW (ref 3.80–5.20)
RDW: 15.3 % — ABNORMAL HIGH (ref 11.5–14.5)
WBC: 16.5 10*3/uL — ABNORMAL HIGH (ref 3.6–11.0)

## 2016-07-17 LAB — BRAIN NATRIURETIC PEPTIDE: B Natriuretic Peptide: 1403 pg/mL — ABNORMAL HIGH (ref 0.0–100.0)

## 2016-07-17 LAB — HEPARIN LEVEL (UNFRACTIONATED): HEPARIN UNFRACTIONATED: 0.46 [IU]/mL (ref 0.30–0.70)

## 2016-07-17 LAB — TROPONIN I: Troponin I: 0.21 ng/mL (ref <0.03)

## 2016-07-17 MED ORDER — HYDRALAZINE HCL 20 MG/ML IJ SOLN
20.0000 mg | Freq: Once | INTRAMUSCULAR | Status: AC
  Administered 2016-07-17: 20 mg via INTRAVENOUS

## 2016-07-17 MED ORDER — IPRATROPIUM-ALBUTEROL 0.5-2.5 (3) MG/3ML IN SOLN
3.0000 mL | Freq: Four times a day (QID) | RESPIRATORY_TRACT | Status: DC | PRN
  Administered 2016-07-17: 3 mL via RESPIRATORY_TRACT
  Filled 2016-07-17: qty 3

## 2016-07-17 MED ORDER — INSULIN GLARGINE 100 UNIT/ML ~~LOC~~ SOLN
20.0000 [IU] | Freq: Every day | SUBCUTANEOUS | Status: DC
  Administered 2016-07-18: 20 [IU] via SUBCUTANEOUS
  Filled 2016-07-17 (×4): qty 0.2

## 2016-07-17 MED ORDER — HYDRALAZINE HCL 20 MG/ML IJ SOLN
INTRAMUSCULAR | Status: AC
  Administered 2016-07-17: 20 mg via INTRAVENOUS
  Filled 2016-07-17: qty 1

## 2016-07-17 MED ORDER — METOPROLOL TARTRATE 25 MG PO TABS
12.5000 mg | ORAL_TABLET | Freq: Two times a day (BID) | ORAL | Status: DC
  Administered 2016-07-17 – 2016-07-18 (×2): 12.5 mg via ORAL
  Filled 2016-07-17 (×3): qty 1

## 2016-07-17 MED ORDER — INSULIN GLARGINE 100 UNIT/ML ~~LOC~~ SOLN
10.0000 [IU] | Freq: Once | SUBCUTANEOUS | Status: AC
  Filled 2016-07-17: qty 0.1

## 2016-07-17 NOTE — Progress Notes (Signed)
Pre CCPD

## 2016-07-17 NOTE — Progress Notes (Signed)
Patient ID: Bethany Lee, female   DOB: [birth year], 65 y.o.   MRN: [id number]  07/17/16  21:00 Called by nursing regarding shortness of breath.  Patient was in stepdown on BiPAP last night but was downgraded to the floor today. This evening she has had worsening shortness of breath despite maintaining O2 sat in the low to mid 90s on 2-3 L O2 via nasal cannula. She also reports abdominal distention and has indicated to nursing staff that she feels that she is retaining fluid. We attempted to increase her O2 and utilize nebulizer therapy without significant relief of her shortness of breath.  Vital signs and O2 saturations remain stable however patient continues to report worsening shortness of breath.  VS: 150/87  P101 R 18 T 98.9 O2 93-94% on 2-3L  On my exam patient is awake, alert, tachypneic. Fine crackles diffusely. Abdomen distended, nontender No LE edema.   Assessment: Acute respiratory failure 2/2 pulmonary edema I have requested a stat chest x-ray, ABG.  Transfer to stepdown for BiPAP and critical care consultation.   07/17/16 2300 Addendum: CXR shows worsening pulmonary edema.  Patient now on BiPAP in ICU and feeling significantly improved.   Critical care consult appreciated! Will continue to monitor.

## 2016-07-17 NOTE — Progress Notes (Addendum)
CCPD initiated. Exit site care performed. Pre weight 68.8kg on bed scale. Pt on 2L 02. Sats 96-100%. No current complaints. Will disconnect first thing in am. Extension line added so that patient can use restroom. Report given to [employer]. 8 hour treatment.

## 2016-07-17 NOTE — Progress Notes (Signed)
Pt transferred from 2A with acute respiratory failure secondary to pulmonary edema and hypertension requiring Bipap.  Pt states breathing is slowly improving since being placed on BIpap.  She is mildly tachypneic on Bipap fine crackles throughout.  She is currently receiving peritoneal dialysis and stating the 2L CCPD fill volume is uncomfortable and she normally has a fill volume of 1700.  However, after speaking to Nephrologist Dr. Holley Raring he would like the peritoneal dialysis settings to remain the same, agrees with treating hypertension aggressively. Will place order for 20 mg iv hydralazine x1.    Marda Stalker, [place] Pager [phone] (please enter 7 digits) PCCM Consult Pager [phone] (please enter 7 digits)

## 2016-07-17 NOTE — Progress Notes (Signed)
RN spoke with Dr. Nehemiah Massed over the phone and asked about need for heparin and nitro drips.  MD gave order to discontinue both heparin and nitro drips.

## 2016-07-17 NOTE — Progress Notes (Signed)
Pt is still c/o SOB and abdomen distention greater than usual, pt is CCPD with a fill volume of 1700 at home, per family "her shortness of breath has to be coming from the extra fluid that is being given to her" pt fill in is 2000 tonight. Family requesting to see a doctor, MD Hugelmeyer has been paged.

## 2016-07-17 NOTE — Progress Notes (Signed)
Will transfer pt to CCU per MD's order, pt to be placed on bipap.

## 2016-07-17 NOTE — Progress Notes (Signed)
PULMONARY / CRITICAL CARE MEDICINE   Name: Bethany Lee MRN: [id number] DOB: 02-18-1951    ADMISSION DATE:  07/16/2016  PT PROFILE:   66 F with ESRD due to DM and hypertension, on peritoneal dialysis since 01/2015 admitted with CP and respiratory distress due to pulmonary edema    MAJOR EVENTS/TEST RESULTS: 12/24 CT chest: no PE. bilat effusions and bilateral central alveolar infiltrates c/w pulmonary edema 12/24 Nephrology consultation for mgmt of dialysis 12/24 Cardiology consultation 12/25 Echocardiogram:   SUBJECTIVE:  Comfortable on [place] O2. No further CP. No new complaints  VITAL SIGNS: BP 119/76   Pulse 93   Temp 97.9 F (36.6 C) (Oral)   Resp 17   Ht 5' (1.524 m)   Wt 150 lb 5.7 oz (68.2 kg)   SpO2 95%   BMI 29.36 kg/m   HEMODYNAMICS:    VENTILATOR SETTINGS: FiO2 (%):  [40 %] 40 %  INTAKE / OUTPUT: I/O last 3 completed shifts: In: 206.5 [I.V.:206.5] Out: -   PHYSICAL EXAMINATION: General: WDWN, NAD, cognition intact Neuro: CNs intact, motor/sens intact, DTRs symmetric HEENT: NCAT, sclera white, oropharynx normal Cardiovascular: Regular, no M Lungs: No wheezes, minimal basilar crackles Abdomen: Soft, NT, +BS Ext: minimal pretibial edema - symmetric Skin: no lesions noted  LABS:  BMET  Recent Labs Lab 07/16/16 0949 07/17/16 0742  NA 140 137  K 5.0 5.1  CL 104 103  CO2 22 23  BUN 92* 88*  CREATININE 13.85* 13.34*  GLUCOSE 120* 100*    Electrolytes  Recent Labs Lab 07/16/16 0949 07/17/16 0742  CALCIUM 7.3* 7.4*    CBC  Recent Labs Lab 07/16/16 0949  WBC 11.1*  HGB 10.2*  HCT 30.0*  PLT 205    Coag's  Recent Labs Lab 07/16/16 0952  APTT 33  INR 0.95    Sepsis Markers No results for input(s): LATICACIDVEN, PROCALCITON, O2SATVEN in the last 168 hours.  ABG  Recent Labs Lab 07/16/16 1035  PHART 7.27*  PCO2ART 38  PO2ART 113*    Liver Enzymes No results for input(s): AST, ALT, ALKPHOS, BILITOT,  ALBUMIN in the last 168 hours.  Cardiac Enzymes  Recent Labs Lab 07/16/16 1415 07/16/16 1827 07/16/16 2345  TROPONINI 0.15* 0.14* 0.21*    Glucose  Recent Labs Lab 07/16/16 1001 07/16/16 1408 07/16/16 1835 07/16/16 2115 07/17/16 0743  GLUCAP 134* 261* 337* 298* 101*    CXR: CM, improved edema pattern  EKG: sinus tach, ant-lateral NSST changes    ASSESSMENT  Acute hypoxic respiratory failure Pulmonary edema Chest pain - description c/w angina Sinus tachycardia Mildly elevated trop I Hypertension ESRD, chronic PD Chronic anemia of renal disease DM 2 - chronic Lantus and SSI - poorly controlled P:   Transfer to telemetry floor Continue supplemental O2 to maintain SpO2 > 92% Cont ASA Add metoprolol - low dose Continue amlodipine PD per Renal service Lantus 10 units X 1 now Resume home dose of Lantus beginning this evening  Discussed with Dr Bridgett Larsson. Hospitalists will assume primary duties after transfer and PCCM service will sign off as of 12/26 AM   Merton Border, MD PCCM service Mobile [phone] Pager [phone] 07/17/2016    07/17/2016, 9:30 AM

## 2016-07-17 NOTE — Progress Notes (Signed)
[facility] Encounter Note  Patient: Bethany Lee / Admit Date: 07/16/2016 / Date of Encounter: 07/17/2016, 7:28 AM   Subjective: Patient is significantly improved overnight after increased dialysis. Shortness of breath and less hypoxia. No current evidence of distress. Troponin elevation and 0.21 or consistent with demand ischemia. Preliminary echocardiogram suggesting normal LV systolic function with slightly decreased ejection fraction  Review of Systems: Positive for: Shortness of breath weakness Negative for: Vision change, hearing change, syncope, dizziness, nausea, vomiting,diarrhea, bloody stool, stomach pain, cough, congestion, diaphoresis, urinary frequency, urinary pain,skin lesions, skin rashes Others previously listed  Objective: Telemetry: Normal sinus rhythm Physical Exam: Blood pressure 135/75, pulse 93, temperature 98.2 F (36.8 C), temperature source Oral, resp. rate 13, height 5' (1.524 m), weight 68.2 kg (150 lb 5.7 oz), SpO2 96 %. Body mass index is 29.36 kg/m. General: Well developed, well nourished, in no acute distress. Head: Normocephalic, atraumatic, sclera non-icteric, no xanthomas, nares are without discharge. Neck: No apparent masses Lungs: Normal respirations with no wheezes, no rhonchi, no rales , few crackles   Heart: Regular rate and rhythm, normal S1 S2, no murmur, no rub, no gallop, PMI is normal size and placement, carotid upstroke normal without bruit, jugular venous pressure normal Abdomen: Soft, non-tender, non-distended with normoactive bowel sounds. No hepatosplenomegaly. Abdominal aorta is normal size without bruit Extremities trace edema, no clubbing, no cyanosis, no ulcers,  Peripheral: 2+ radial, 2+ femoral, 2+ dorsal pedal pulses Neuro: Alert and oriented. Moves all extremities spontaneously. Psych:  Responds to questions appropriately with a normal affect.   Intake/Output Summary (Last 24 hours) at 07/17/16  0728 Last data filed at 07/17/16 0600  Gross per 24 hour  Intake               88 ml  Output                0 ml  Net               88 ml    Inpatient Medications:  . amLODipine  5 mg Oral Daily  . aspirin  81 mg Oral Daily  . calcium acetate  2,001 mg Oral TID WC  . dialysis solution 2.5% low-MG/low-CA   Intraperitoneal Q24H  . furosemide  80 mg Oral BID  . gabapentin  300 mg Oral TID  . gentamicin cream  1 application Topical Daily  . insulin aspart  0-15 Units Subcutaneous TID WC  . insulin aspart  0-5 Units Subcutaneous QHS  . insulin aspart  3 Units Subcutaneous TID WC  . insulin glargine  10 Units Subcutaneous QHS  . levothyroxine  50 mcg Oral QAC breakfast  . metolazone  5 mg Oral Daily  . multivitamin  1 tablet Oral QHS  . pravastatin  40 mg Oral q1800  . sodium bicarbonate  1,300 mg Oral BID  . vitamin B-12  1,000 mcg Oral Daily   Infusions:  . heparin 900 Units/hr (07/17/16 0600)  . nitroGLYCERIN 20 mcg/min (07/16/16 1557)    Labs:  Recent Labs  07/16/16 0949  NA 140  K 5.0  CL 104  CO2 22  GLUCOSE 120*  BUN 92*  CREATININE 13.85*  CALCIUM 7.3*   No results for input(s): AST, ALT, ALKPHOS, BILITOT, PROT, ALBUMIN in the last 72 hours.  Recent Labs  07/16/16 0949  WBC 11.1*  HGB 10.2*  HCT 30.0*  MCV 90.6  PLT 205    Recent Labs  07/16/16 0949 07/16/16 1415  07/16/16 1827 07/16/16 2345  TROPONINI 0.16* 0.15* 0.14* 0.21*   Invalid input(s): POCBNP No results for input(s): HGBA1C in the last 72 hours.   Weights: Filed Weights   07/16/16 4827 07/16/16 1400 07/16/16 1422  Weight: 63.5 kg (140 lb) 68.2 kg (150 lb 5.7 oz) 68.2 kg (150 lb 5.7 oz)     Radiology/Studies:  Ct Angio Chest Pe W And/or Wo Contrast  Result Date: 07/16/2016 CLINICAL DATA:  Chest pressure, shortness of breath, cough, onset of symptoms yesterday, nausea, became diaphoretic and tachypneic in ED, end-stage renal disease on dialysis, diabetes mellitus, hypertension  EXAM: CT ANGIOGRAPHY CHEST WITH CONTRAST TECHNIQUE: Multidetector CT imaging of the chest was performed using the standard protocol during bolus administration of intravenous contrast. Multiplanar CT image reconstructions and MIPs were obtained to evaluate the vascular anatomy. CONTRAST:  75 cc Isovue 370 IV COMPARISON:  None FINDINGS: Cardiovascular: Atherosclerotic calcifications aorta, proximal great vessels and coronary arteries. Aorta normal caliber without aneurysm or dissection. No pericardial effusion. Pulmonary arteries well opacified and patent. No evidence of pulmonary embolism. Cardiac chambers appear mildly enlarged. Mediastinum/Nodes: 10 mm prevascular node image 22. Few additional normal sized mediastinal nodes. No additional thoracic adenopathy. Esophagus unremarkable. Base of cervical region normal appearance. Lungs/Pleura: BILATERAL pleural effusions and compressive atelectasis of the adjacent lungs. Infiltrates identified centrally in in all lobes question pulmonary edema though infection not excluded. No pneumothorax. Upper Abdomen: Unremarkable Musculoskeletal: Old LEFT rib fractures.  No acute osseous findings. Review of the MIP images confirms the above findings. IMPRESSION: No evidence of pulmonary embolism. Aortic atherosclerosis and coronary arterial calcification. BILATERAL pleural effusions and central infiltrates throughout all lobes, favoring pulmonary edema over infection. Electronically Signed   By: Lavonia Dana M.D.   On: 07/16/2016 11:23   Dg Chest Portable 1 View  Result Date: 07/16/2016 CLINICAL DATA:  Shortness of breath, chest pressure, cough and nausea beginning today. EXAM: PORTABLE CHEST 1 VIEW COMPARISON:  PA and lateral chest 12/08/2015. FINDINGS: There is cardiomegaly and pulmonary edema. No pneumothorax or pleural effusion. No acute bony abnormality. IMPRESSION: Congestive heart failure. Electronically Signed   By: Inge Rise M.D.   On: 07/16/2016 10:34      Assessment and Recommendation  65 y.o. female with acute systolic dysfunction congestive heart failure multifactorial in nature including hypertension and fluid overload due to end-stage renal disease with dialysis without evidence of myocardial infarction 1. Continue dialysis with significant improvements in overall condition 2. No further intervention of minimal elevation of troponin more consistent with demand ischemia rather than acute coronary syndrome 3. Continue amlodipine for hypertension control with consideration of low-dose beta blocker for further risk reduction of future diastolic dysfunction heart failure if able to tolerate 4. Begin ambulation following for any further significant symptoms and adjustments of medication management 5. No further cardiac diagnostics necessary at this time Signed, Serafina Royals M.D. FACC

## 2016-07-17 NOTE — Progress Notes (Signed)
Per Vallery Sa, RN, Dr. Holley Raring instructed her to  Leave PD overnight and come over in the morning to take pt off.

## 2016-07-17 NOTE — Progress Notes (Signed)
Bipap is on standby at this time with patients o2 saturation 98% on 3l [place]. Will continue to monitor.

## 2016-07-17 NOTE — Progress Notes (Signed)
Pt c/o SOB, increased her oxygen to 3L, patient oxygen level at 94%, MD Hugelmeyer made aware, an order for nebulizer treatment was obtained.

## 2016-07-17 NOTE — Progress Notes (Signed)
Report called to Manuela Schwartz, RN on 2A.  Patient has been A&Ox4.  NSR per cardiac monitor.  Denies chest pain.  Dialysis planned for this evening.  Husband at bedside.

## 2016-07-17 NOTE — Progress Notes (Signed)
Patient moved to room 238 by wheelchair with husband at side.  Alert with no distress noted.  This RN hooked patient up to 2A tele monitor and spoke with Tammy from [facility] and verified tele box.

## 2016-07-17 NOTE — Progress Notes (Signed)
Admitted for acute respiratory failure due to pulmonary edema yesterday. Off BIPAP. ESRD on HD.

## 2016-07-17 NOTE — Progress Notes (Signed)
[facility]  ROUNDING NOTE   Subjective:  Patient seen and evaluated at bedside. She tolerated peritoneal dialysis well. 1.1 L of ultrafiltration was performed. She is currently off of BiPAP.   Objective:  Vital signs in last 24 hours:  Temp:  [97.1 F (36.2 C)-98.4 F (36.9 C)] 97.9 F (36.6 C) (12/25 0819) Pulse Rate:  [77-124] 93 (12/25 0900) Resp:  [13-39] 17 (12/25 0900) BP: (113-201)/(56-130) 119/76 (12/25 0900) SpO2:  [81 %-100 %] 95 % (12/25 0900) FiO2 (%):  [40 %] 40 % (12/24 1209) Weight:  [63.5 kg (140 lb)-68.2 kg (150 lb 5.7 oz)] 68.2 kg (150 lb 5.7 oz) (12/24 1422)  Weight change:  Filed Weights   07/16/16 0952 07/16/16 1400 07/16/16 1422  Weight: 63.5 kg (140 lb) 68.2 kg (150 lb 5.7 oz) 68.2 kg (150 lb 5.7 oz)    Intake/Output: I/O last 3 completed shifts: In: 206.5 [I.V.:206.5] Out: -    Intake/Output this shift:  Total I/O In: 9 [I.V.:9] Out: -   Physical Exam: General: No acute distress   Head: Normocephalic, atraumatic. Moist oral mucosal membranes  Eyes: Anicteric  Neck: Supple, trachea midline  Lungs:  Basilar rales, normal effort   Heart: S1S2 no rubs  Abdomen:  Soft, nontender, bowel sounds present  Extremities: Trace peripheral edema.  Neurologic: Nonfocal, moving all four extremities  Skin: No lesions  Access: PD catheter in place    Basic Metabolic Panel:  Recent Labs Lab 07/16/16 0949 07/17/16 0742  NA 140 137  K 5.0 5.1  CL 104 103  CO2 22 23  GLUCOSE 120* 100*  BUN 92* 88*  CREATININE 13.85* 13.34*  CALCIUM 7.3* 7.4*    Liver Function Tests: No results for input(s): AST, ALT, ALKPHOS, BILITOT, PROT, ALBUMIN in the last 168 hours. No results for input(s): LIPASE, AMYLASE in the last 168 hours. No results for input(s): AMMONIA in the last 168 hours.  CBC:  Recent Labs Lab 07/16/16 0949  WBC 11.1*  HGB 10.2*  HCT 30.0*  MCV 90.6  PLT 205    Cardiac Enzymes:  Recent Labs Lab 07/16/16 0949  07/16/16 1415 07/16/16 1827 07/16/16 2345  TROPONINI 0.16* 0.15* 0.14* 0.21*    BNP: Invalid input(s): POCBNP  CBG:  Recent Labs Lab 07/16/16 1001 07/16/16 1408 07/16/16 1835 07/16/16 2115 07/17/16 0743  GLUCAP 134* 261* 337* 298* 101*    Microbiology: Results for orders placed or performed during the hospital encounter of 07/16/16  MRSA PCR Screening     Status: None   Collection Time: 07/16/16  2:09 PM  Result Value Ref Range Status   MRSA by PCR NEGATIVE NEGATIVE Final    Comment:        The GeneXpert MRSA Assay (FDA approved for NASAL specimens only), is one component of a comprehensive MRSA colonization surveillance program. It is not intended to diagnose MRSA infection nor to guide or monitor treatment for MRSA infections.     Coagulation Studies:  Recent Labs  07/16/16 0952  LABPROT 12.7  INR 0.95    Urinalysis: No results for input(s): COLORURINE, LABSPEC, PHURINE, GLUCOSEU, HGBUR, BILIRUBINUR, KETONESUR, PROTEINUR, UROBILINOGEN, NITRITE, LEUKOCYTESUR in the last 72 hours.  Invalid input(s): APPERANCEUR    Imaging: Ct Angio Chest Pe W And/or Wo Contrast  Result Date: 07/16/2016 CLINICAL DATA:  Chest pressure, shortness of breath, cough, onset of symptoms yesterday, nausea, became diaphoretic and tachypneic in ED, end-stage renal disease on dialysis, diabetes mellitus, hypertension EXAM: CT ANGIOGRAPHY CHEST WITH CONTRAST TECHNIQUE:  Multidetector CT imaging of the chest was performed using the standard protocol during bolus administration of intravenous contrast. Multiplanar CT image reconstructions and MIPs were obtained to evaluate the vascular anatomy. CONTRAST:  75 cc Isovue 370 IV COMPARISON:  None FINDINGS: Cardiovascular: Atherosclerotic calcifications aorta, proximal great vessels and coronary arteries. Aorta normal caliber without aneurysm or dissection. No pericardial effusion. Pulmonary arteries well opacified and patent. No evidence of  pulmonary embolism. Cardiac chambers appear mildly enlarged. Mediastinum/Nodes: 10 mm prevascular node image 22. Few additional normal sized mediastinal nodes. No additional thoracic adenopathy. Esophagus unremarkable. Base of cervical region normal appearance. Lungs/Pleura: BILATERAL pleural effusions and compressive atelectasis of the adjacent lungs. Infiltrates identified centrally in in all lobes question pulmonary edema though infection not excluded. No pneumothorax. Upper Abdomen: Unremarkable Musculoskeletal: Old LEFT rib fractures.  No acute osseous findings. Review of the MIP images confirms the above findings. IMPRESSION: No evidence of pulmonary embolism. Aortic atherosclerosis and coronary arterial calcification. BILATERAL pleural effusions and central infiltrates throughout all lobes, favoring pulmonary edema over infection. Electronically Signed   By: Lavonia Dana M.D.   On: 07/16/2016 11:23   Dg Chest Port 1 View  Result Date: 07/17/2016 CLINICAL DATA:  Followup of pulmonary edema. Diabetes. Hypertension. EXAM: PORTABLE CHEST 1 VIEW COMPARISON:  Plain films and CT of 1 day prior FINDINGS: Midline trachea. Moderate cardiomegaly. Atherosclerosis in the transverse aorta. Possible tiny residual left pleural effusion. No right pleural effusion or pneumothorax. Resolved interstitial edema with mild pulmonary venous congestion remaining. No lobar consolidation. Nonacute lateral left rib fractures. IMPRESSION: Cardiomegaly with improved interstitial edema. Mild pulmonary venous congestion remains. Aortic atherosclerosis. Electronically Signed   By: Abigail Miyamoto M.D.   On: 07/17/2016 07:52   Dg Chest Portable 1 View  Result Date: 07/16/2016 CLINICAL DATA:  Shortness of breath, chest pressure, cough and nausea beginning today. EXAM: PORTABLE CHEST 1 VIEW COMPARISON:  PA and lateral chest 12/08/2015. FINDINGS: There is cardiomegaly and pulmonary edema. No pneumothorax or pleural effusion. No acute bony  abnormality. IMPRESSION: Congestive heart failure. Electronically Signed   By: Inge Rise M.D.   On: 07/16/2016 10:34     Medications:    . amLODipine  5 mg Oral Daily  . aspirin  81 mg Oral Daily  . calcium acetate  2,001 mg Oral TID WC  . dialysis solution 2.5% low-MG/low-CA   Intraperitoneal Q24H  . furosemide  80 mg Oral BID  . gabapentin  300 mg Oral TID  . gentamicin cream  1 application Topical Daily  . insulin aspart  0-15 Units Subcutaneous TID WC  . insulin aspart  0-5 Units Subcutaneous QHS  . insulin aspart  3 Units Subcutaneous TID WC  . insulin glargine  10 Units Subcutaneous QHS  . levothyroxine  50 mcg Oral QAC breakfast  . metolazone  5 mg Oral Daily  . multivitamin  1 tablet Oral QHS  . pravastatin  40 mg Oral q1800  . sodium bicarbonate  1,300 mg Oral BID  . vitamin B-12  1,000 mcg Oral Daily   sodium chloride, acetaminophen, docusate sodium, metoprolol, nitroGLYCERIN, ondansetron (ZOFRAN) IV, oxyCODONE-acetaminophen  Assessment/ Plan:  65 y.o. female with diabetes mellitus type 2, diabetic neuropathy, anemia, hypertension, hyperlipidemia, diabetic nephropathy, ESRD due to DM,  PD catheter placed 11/25/15, anemia of CKD, SHPTH, admission for respiratory failure 07/16/16  [facility]/PD  1.  End-stage renal disease on peritoneal dialysis 6 nights a week. -Patient did not perform her peritoneal dialysis on the night prior to admission.  In addition she did not take her antihypertensives. She presented in volume overload. -  Patient did well overnight with peritoneal dialysis using 2.5% extra solution. We will proceed with this again today.  Consider performing peritoneal dialysis 7 nights a week.  2. Acute respiratory failure. Multifactorial with contributions from missed antihypertensives, ingestion of salty soup, and elevated blood pressure upon admission.  - Patient weaned off of BiPAP. Infiltrates on x-ray have improved but we will continue to make efforts to  perform ultrafiltration with peritoneal dialysis this evening.  3. Anemia of chronic kidney disease. Hemoglobin currently 10.2. She will continue with Epogen as an outpatient.  4. Secondary hyperparathyroidism.  Continue PhosLo at this time.  LOS: 1 Akili Corsetti 12/25/20179:16 AM

## 2016-07-18 ENCOUNTER — Inpatient Hospital Stay: Payer: Commercial Managed Care - HMO

## 2016-07-18 DIAGNOSIS — Z23 Encounter for immunization: Secondary | ICD-10-CM | POA: Diagnosis not present

## 2016-07-18 DIAGNOSIS — N186 End stage renal disease: Secondary | ICD-10-CM | POA: Diagnosis not present

## 2016-07-18 DIAGNOSIS — Z992 Dependence on renal dialysis: Secondary | ICD-10-CM | POA: Diagnosis not present

## 2016-07-18 LAB — BASIC METABOLIC PANEL
ANION GAP: 13 (ref 5–15)
BUN: 90 mg/dL — ABNORMAL HIGH (ref 6–20)
CALCIUM: 8.1 mg/dL — AB (ref 8.9–10.3)
CO2: 24 mmol/L (ref 22–32)
Chloride: 98 mmol/L — ABNORMAL LOW (ref 101–111)
Creatinine, Ser: 12.77 mg/dL — ABNORMAL HIGH (ref 0.44–1.00)
GFR, EST AFRICAN AMERICAN: 3 mL/min — AB (ref >60)
GFR, EST NON AFRICAN AMERICAN: 3 mL/min — AB (ref >60)
Glucose, Bld: 92 mg/dL (ref 65–99)
POTASSIUM: 4.7 mmol/L (ref 3.5–5.1)
SODIUM: 135 mmol/L (ref 135–145)

## 2016-07-18 LAB — ECHOCARDIOGRAM COMPLETE
Height: 60 in
Weight: 2405.66 oz

## 2016-07-18 LAB — GLUCOSE, CAPILLARY
GLUCOSE-CAPILLARY: 116 mg/dL — AB (ref 65–99)
GLUCOSE-CAPILLARY: 76 mg/dL (ref 65–99)
GLUCOSE-CAPILLARY: 163 mg/dL — AB (ref 65–99)
Glucose-Capillary: 154 mg/dL — ABNORMAL HIGH (ref 65–99)

## 2016-07-18 LAB — TROPONIN I: TROPONIN I: 0.21 ng/mL — AB (ref <0.03)

## 2016-07-18 MED ORDER — AMLODIPINE BESYLATE 10 MG PO TABS
10.0000 mg | ORAL_TABLET | Freq: Every day | ORAL | Status: DC
  Administered 2016-07-19 – 2016-07-20 (×2): 10 mg via ORAL
  Filled 2016-07-18 (×2): qty 1

## 2016-07-18 MED ORDER — HEPARIN SODIUM (PORCINE) 5000 UNIT/ML IJ SOLN
5000.0000 [IU] | Freq: Three times a day (TID) | INTRAMUSCULAR | Status: DC
  Administered 2016-07-18 – 2016-07-20 (×5): 5000 [IU] via SUBCUTANEOUS
  Filled 2016-07-18 (×5): qty 1

## 2016-07-18 MED ORDER — CARVEDILOL 6.25 MG PO TABS
6.2500 mg | ORAL_TABLET | Freq: Two times a day (BID) | ORAL | Status: DC
  Administered 2016-07-18 (×2): 6.25 mg via ORAL
  Filled 2016-07-18 (×2): qty 1

## 2016-07-18 MED ORDER — FUROSEMIDE 10 MG/ML IJ SOLN
40.0000 mg | Freq: Once | INTRAMUSCULAR | Status: AC
  Administered 2016-07-18: 40 mg via INTRAVENOUS
  Filled 2016-07-18: qty 4

## 2016-07-18 MED ORDER — METOPROLOL TARTRATE 25 MG PO TABS: 25.0000 mg | ORAL_TABLET | Freq: Two times a day (BID) | ORAL | Status: DC

## 2016-07-18 MED ORDER — CARVEDILOL 6.25 MG PO TABS: 12.5000 mg | ORAL_TABLET | Freq: Two times a day (BID) | ORAL | Status: DC

## 2016-07-18 MED ORDER — HYDRALAZINE HCL 20 MG/ML IJ SOLN: 10.0000 mg | Freq: Four times a day (QID) | INTRAMUSCULAR | Status: DC | PRN

## 2016-07-18 MED ORDER — GENTAMICIN SULFATE 0.1 % EX CREA: 1.0000 "application " | TOPICAL_CREAM | Freq: Every day | CUTANEOUS | Status: DC

## 2016-07-18 MED ORDER — METOPROLOL TARTRATE 50 MG PO TABS: 50.0000 mg | ORAL_TABLET | Freq: Two times a day (BID) | ORAL | Status: DC

## 2016-07-18 MED ORDER — HEPARIN 1000 UNIT/ML FOR PERITONEAL DIALYSIS
3000.0000 [IU] | INTRAMUSCULAR | Status: DC | PRN
  Filled 2016-07-18: qty 3

## 2016-07-18 NOTE — Progress Notes (Signed)
[facility]  ROUNDING NOTE   Subjective:   PD last night.  Transferred to ICU for respiratory distress. Placed on BiPAP  Increased dose of metoprolol as per cardiology   Objective:  Vital signs in last 24 hours:  Temp:  [97.5 F (36.4 C)-100 F (37.8 C)] 100 F (37.8 C) (12/26 0800) Pulse Rate:  [92-112] 99 (12/26 0917) Resp:  [18-29] 24 (12/26 0900) BP: (128-186)/(64-91) 166/79 (12/26 0917) SpO2:  [91 %-100 %] 98 % (12/26 0900) FiO2 (%):  [40 %] 40 % (12/25 2300) Weight:  [71.5 kg (157 lb 10.1 oz)] 71.5 kg (157 lb 10.1 oz) (12/26 0431)  Weight change: 7.996 kg (17 lb 10.1 oz) Filed Weights   07/16/16 1400 07/16/16 1422 07/18/16 0431  Weight: 68.2 kg (150 lb 5.7 oz) 68.2 kg (150 lb 5.7 oz) 71.5 kg (157 lb 10.1 oz)    Intake/Output: I/O last 3 completed shifts: In: 976 [P.O.:720; I.V.:177] Out: 1117 [Other:1117]   Intake/Output this shift:  Total I/O In: 90 [P.O.:90] Out: -   Physical Exam: General: No acute distress   Head: Normocephalic, atraumatic. Moist oral mucosal membranes  Eyes: Anicteric  Neck: Supple, trachea midline  Lungs:  Basilar rales, normal effort   Heart: S1S2 no rubs  Abdomen:  Soft, nontender, bowel sounds present  Extremities: No peripheral edema.  Neurologic: Nonfocal, moving all four extremities  Skin: No lesions  Access: PD catheter in place    Basic Metabolic Panel:  Recent Labs Lab 07/16/16 0949 07/17/16 0742  NA 140 137  K 5.0 5.1  CL 104 103  CO2 22 23  GLUCOSE 120* 100*  BUN 92* 88*  CREATININE 13.85* 13.34*  CALCIUM 7.3* 7.4*    Liver Function Tests: No results for input(s): AST, ALT, ALKPHOS, BILITOT, PROT, ALBUMIN in the last 168 hours. No results for input(s): LIPASE, AMYLASE in the last 168 hours. No results for input(s): AMMONIA in the last 168 hours.  CBC:  Recent Labs Lab 07/16/16 0949 07/17/16 0742  WBC 11.1* 16.5*  HGB 10.2* 7.2*  HCT 30.0* 21.6*  MCV 90.6 90.7  PLT 205 183     Cardiac Enzymes:  Recent Labs Lab 07/16/16 0949 07/16/16 1415 07/16/16 1827 07/16/16 2345  TROPONINI 0.16* 0.15* 0.14* 0.21*    BNP: Invalid input(s): POCBNP  CBG:  Recent Labs Lab 07/17/16 1118 07/17/16 1656 07/17/16 2059 07/17/16 2207 07/18/16 0811  GLUCAP 166* 119* 142* 177* 116*    Microbiology: Results for orders placed or performed during the hospital encounter of 07/16/16  MRSA PCR Screening     Status: None   Collection Time: 07/16/16  2:09 PM  Result Value Ref Range Status   MRSA by PCR NEGATIVE NEGATIVE Final    Comment:        The GeneXpert MRSA Assay (FDA approved for NASAL specimens only), is one component of a comprehensive MRSA colonization surveillance program. It is not intended to diagnose MRSA infection nor to guide or monitor treatment for MRSA infections.     Coagulation Studies:  Recent Labs  07/16/16 0952  LABPROT 12.7  INR 0.95    Urinalysis: No results for input(s): COLORURINE, LABSPEC, PHURINE, GLUCOSEU, HGBUR, BILIRUBINUR, KETONESUR, PROTEINUR, UROBILINOGEN, NITRITE, LEUKOCYTESUR in the last 72 hours.  Invalid input(s): APPERANCEUR    Imaging: Dg Chest 1 View  Result Date: 07/17/2016 CLINICAL DATA:  Shortness of breath. EXAM: CHEST 1 VIEW COMPARISON:  Radiographs earlier this day at 0610 hour, CT yesterday. FINDINGS: Worsening bilateral suprahilar and diffuse interstitial  opacities concerning for progressive edema. Trace pleural effusions. Cardiomegaly again seen. No pneumothorax or confluent airspace disease. There are old left rib fractures. IMPRESSION: Worsening pulmonary edema from prior exams. Electronically Signed   By: Jeb Levering M.D.   On: 07/17/2016 22:12   Ct Angio Chest Pe W And/or Wo Contrast  Result Date: 07/16/2016 CLINICAL DATA:  Chest pressure, shortness of breath, cough, onset of symptoms yesterday, nausea, became diaphoretic and tachypneic in ED, end-stage renal disease on dialysis, diabetes  mellitus, hypertension EXAM: CT ANGIOGRAPHY CHEST WITH CONTRAST TECHNIQUE: Multidetector CT imaging of the chest was performed using the standard protocol during bolus administration of intravenous contrast. Multiplanar CT image reconstructions and MIPs were obtained to evaluate the vascular anatomy. CONTRAST:  75 cc Isovue 370 IV COMPARISON:  None FINDINGS: Cardiovascular: Atherosclerotic calcifications aorta, proximal great vessels and coronary arteries. Aorta normal caliber without aneurysm or dissection. No pericardial effusion. Pulmonary arteries well opacified and patent. No evidence of pulmonary embolism. Cardiac chambers appear mildly enlarged. Mediastinum/Nodes: 10 mm prevascular node image 22. Few additional normal sized mediastinal nodes. No additional thoracic adenopathy. Esophagus unremarkable. Base of cervical region normal appearance. Lungs/Pleura: BILATERAL pleural effusions and compressive atelectasis of the adjacent lungs. Infiltrates identified centrally in in all lobes question pulmonary edema though infection not excluded. No pneumothorax. Upper Abdomen: Unremarkable Musculoskeletal: Old LEFT rib fractures.  No acute osseous findings. Review of the MIP images confirms the above findings. IMPRESSION: No evidence of pulmonary embolism. Aortic atherosclerosis and coronary arterial calcification. BILATERAL pleural effusions and central infiltrates throughout all lobes, favoring pulmonary edema over infection. Electronically Signed   By: Lavonia Dana M.D.   On: 07/16/2016 11:23   Dg Chest Port 1 View  Result Date: 07/18/2016 CLINICAL DATA:  Acute respiratory failure EXAM: PORTABLE CHEST 1 VIEW COMPARISON:  06/2023 17 chest CT, 07/17/2016 CXR FINDINGS: Portable AP semi upright view of the chest demonstrates cardiomegaly with aortic atherosclerosis. Interstitial edema with more confluent airspace opacities in the upper lobes are again seen without significant interval change. Slight blunting the  costophrenic angles consistent small pleural effusions. No acute osseous appearing abnormality. The visualized cervical spine demonstrates degenerative disc disease and uncovertebral joint osteoarthritis. IMPRESSION: Cardiomegaly with interstitial edema. More confluent airspace opacities in the upper lobes are again noted which cannot entirely exclude pneumonia. Small bilateral pleural effusions. Electronically Signed   By: Ashley Royalty M.D.   On: 07/18/2016 03:49   Dg Chest Port 1 View  Result Date: 07/17/2016 CLINICAL DATA:  Followup of pulmonary edema. Diabetes. Hypertension. EXAM: PORTABLE CHEST 1 VIEW COMPARISON:  Plain films and CT of 1 day prior FINDINGS: Midline trachea. Moderate cardiomegaly. Atherosclerosis in the transverse aorta. Possible tiny residual left pleural effusion. No right pleural effusion or pneumothorax. Resolved interstitial edema with mild pulmonary venous congestion remaining. No lobar consolidation. Nonacute lateral left rib fractures. IMPRESSION: Cardiomegaly with improved interstitial edema. Mild pulmonary venous congestion remains. Aortic atherosclerosis. Electronically Signed   By: Abigail Miyamoto M.D.   On: 07/17/2016 07:52   Dg Chest Portable 1 View  Result Date: 07/16/2016 CLINICAL DATA:  Shortness of breath, chest pressure, cough and nausea beginning today. EXAM: PORTABLE CHEST 1 VIEW COMPARISON:  PA and lateral chest 12/08/2015. FINDINGS: There is cardiomegaly and pulmonary edema. No pneumothorax or pleural effusion. No acute bony abnormality. IMPRESSION: Congestive heart failure. Electronically Signed   By: Inge Rise M.D.   On: 07/16/2016 10:34     Medications:    . [START ON 07/19/2016] amLODipine  10  mg Oral Daily  . aspirin  81 mg Oral Daily  . calcium acetate  2,001 mg Oral TID WC  . dialysis solution 2.5% low-MG/low-CA   Intraperitoneal Q24H  . furosemide  80 mg Oral BID  . gentamicin cream  1 application Topical Daily  . insulin aspart  0-15  Units Subcutaneous TID WC  . insulin aspart  0-5 Units Subcutaneous QHS  . insulin aspart  3 Units Subcutaneous TID WC  . insulin glargine  20 Units Subcutaneous QHS  . levothyroxine  50 mcg Oral QAC breakfast  . metolazone  5 mg Oral Daily  . metoprolol tartrate  50 mg Oral BID  . multivitamin  1 tablet Oral QHS  . pravastatin  40 mg Oral q1800  . sodium bicarbonate  1,300 mg Oral BID  . vitamin B-12  1,000 mcg Oral Daily   sodium chloride, acetaminophen, docusate sodium, hydrALAZINE, ipratropium-albuterol, metoprolol, nitroGLYCERIN, ondansetron (ZOFRAN) IV, oxyCODONE-acetaminophen  Assessment/ Plan:  65 y.o. female with diabetes mellitus type 2, diabetic neuropathy, anemia, hypertension, hyperlipidemia, diabetic nephropathy, ESRD due to DM,  PD catheter placed 11/25/15, anemia of CKD, SHPTH, admission for respiratory failure 07/16/16  [facility]/PD CCPD 9 hours 4 exchanges 17102mL  1.  End-stage renal disease on peritoneal dialysis 6 nights a week. - Change to 7 nights per week.  - Discontinue sodium bicarbonate - Increase to 1 4.25% and 1 2.5% bags.   2. Acute respiratory failure. Multifactorial with contributions from missed antihypertensives, high salt diet and inadequate dialysis.   3. Hypertension: increased dose of metoprolol - will change to carvedilol - furosemide, metolazone - amlodipine  3. Anemia of chronic kidney disease. Hemoglobin 7.2 - epo as outpatient  4. Secondary hyperparathyroidism.   - calcium acetate   LOS: 2 Tate Jerkins, [place] 12/26/20179:46 AM

## 2016-07-18 NOTE — Progress Notes (Signed)
Chaplain rounded the unit to provide a compassionate presence and spiritual support to the patient. Patient appeared to be sleeping.  Chaplain provided silent prayer. Minerva Fester [phone]

## 2016-07-18 NOTE — Progress Notes (Signed)
Bipap on standby. Pt is in no distress. 2L [place] in place. O2 sat 98%

## 2016-07-18 NOTE — Progress Notes (Signed)
Dialysis Rn assessment

## 2016-07-18 NOTE — Progress Notes (Addendum)
[facility] [facility] [place] NAME: Bethany Lee    MR#:  [id number]  DATE OF BIRTH:  1951-05-14  SUBJECTIVE:   Came in with sob and resp distress. Transferred to ICU for BIPAP REVIEW OF SYSTEMS:   Review of Systems  Constitutional: Negative for chills, fever and weight loss.  HENT: Negative for ear discharge, ear pain and nosebleeds.   Eyes: Negative for blurred vision, pain and discharge.  Respiratory: Positive for shortness of breath. Negative for sputum production, wheezing and stridor.   Cardiovascular: Positive for orthopnea. Negative for chest pain, palpitations and PND.  Gastrointestinal: Negative for abdominal pain, diarrhea, nausea and vomiting.  Genitourinary: Negative for frequency and urgency.  Musculoskeletal: Negative for back pain and joint pain.  Neurological: Positive for weakness. Negative for sensory change, speech change and focal weakness.  Psychiatric/Behavioral: Negative for depression and hallucinations. The patient is not nervous/anxious.    Tolerating Diet:yes Tolerating PT: ambulatory  DRUG ALLERGIES:   Allergies  Allergen Reactions  . Penicillins     VITALS:  Blood pressure 137/60, pulse 80, temperature 100 F (37.8 C), temperature source Oral, resp. rate (!) 23, height 5' (1.524 m), weight 71.5 kg (157 lb 10.1 oz), SpO2 94 %.  PHYSICAL EXAMINATION:   Physical Exam  GENERAL:  65 y.o.-year-old patient lying in the bed with no acute distress.  EYES: Pupils equal, round, reactive to light and accommodation. No scleral icterus. Extraocular muscles intact.  HEENT: Head atraumatic, normocephalic. Oropharynx and nasopharynx clear.  NECK:  Supple, no jugular venous distention. No thyroid enlargement, no tenderness.  LUNGS: disant breath sounds bilaterally, no wheezing, rales, rhonchi. No use of accessory muscles of respiration.  CARDIOVASCULAR: S1, S2 normal. No murmurs, rubs, or gallops.  ABDOMEN: Soft,  nontender, nondistended. Bowel sounds present. No organomegaly or mass. PD cath+ EXTREMITIES: No cyanosis, clubbing or edema b/l.    NEUROLOGIC: Cranial nerves II through XII are intact. No focal Motor or sensory deficits b/l.   PSYCHIATRIC:  patient is alert and oriented x 3.  SKIN: No obvious rash, lesion, or ulcer.   LABORATORY PANEL:  CBC  Recent Labs Lab 07/17/16 0742  WBC 16.5*  HGB 7.2*  HCT 21.6*  PLT 183    Chemistries   Recent Labs Lab 07/18/16 0847  NA 135  K 4.7  CL 98*  CO2 24  GLUCOSE 92  BUN 90*  CREATININE 12.77*  CALCIUM 8.1*   Cardiac Enzymes  Recent Labs Lab 07/18/16 0847  TROPONINI 0.21*   RADIOLOGY:  Dg Chest 1 View  Result Date: 07/17/2016 CLINICAL DATA:  Shortness of breath. EXAM: CHEST 1 VIEW COMPARISON:  Radiographs earlier this day at 0610 hour, CT yesterday. FINDINGS: Worsening bilateral suprahilar and diffuse interstitial opacities concerning for progressive edema. Trace pleural effusions. Cardiomegaly again seen. No pneumothorax or confluent airspace disease. There are old left rib fractures. IMPRESSION: Worsening pulmonary edema from prior exams. Electronically Signed   By: Jeb Levering M.D.   On: 07/17/2016 22:12   Dg Chest Port 1 View  Result Date: 07/18/2016 CLINICAL DATA:  Acute respiratory failure EXAM: PORTABLE CHEST 1 VIEW COMPARISON:  06/2023 17 chest CT, 07/17/2016 CXR FINDINGS: Portable AP semi upright view of the chest demonstrates cardiomegaly with aortic atherosclerosis. Interstitial edema with more confluent airspace opacities in the upper lobes are again seen without significant interval change. Slight blunting the costophrenic angles consistent small pleural effusions. No acute osseous appearing abnormality. The visualized cervical spine demonstrates degenerative disc  disease and uncovertebral joint osteoarthritis. IMPRESSION: Cardiomegaly with interstitial edema. More confluent airspace opacities in the upper lobes are  again noted which cannot entirely exclude pneumonia. Small bilateral pleural effusions. Electronically Signed   By: Ashley Royalty M.D.   On: 07/18/2016 03:49   Dg Chest Port 1 View  Result Date: 07/17/2016 CLINICAL DATA:  Followup of pulmonary edema. Diabetes. Hypertension. EXAM: PORTABLE CHEST 1 VIEW COMPARISON:  Plain films and CT of 1 day prior FINDINGS: Midline trachea. Moderate cardiomegaly. Atherosclerosis in the transverse aorta. Possible tiny residual left pleural effusion. No right pleural effusion or pneumothorax. Resolved interstitial edema with mild pulmonary venous congestion remaining. No lobar consolidation. Nonacute lateral left rib fractures. IMPRESSION: Cardiomegaly with improved interstitial edema. Mild pulmonary venous congestion remains. Aortic atherosclerosis. Electronically Signed   By: Abigail Miyamoto M.D.   On: 07/17/2016 07:52   ASSESSMENT AND PLAN:  8 F with ESRD due to DM and hypertension, on peritoneal dialysis since 01/2015 admitted with CP and respiratory distress due to pulmonary edema  1,Acute hypoxic respiratory failure due to Pulmonary edema -Patient was transferred back to the ICU placed on BiPAP. Currently she is on 3 L nasal S September 96%. -She does have bilateral crackles.continue her Lasix 80 mg twice a day -Patient say she feels a lot better.  2. Chest pain - description c/w angina   3. Sinus tachycardia improved  4. Mildly elevated trop likely demand ischemia in the setting of pulmonary edema no chest pain. No acute EKG changes  5. Hypertension Continue metoprolol, hydralazine, Lasix, amlodipine, Coreg  6. ESRD, chronic PD -Followed by nephrology  7. DM 2 - chronic Lantus and SSI - poorly controlled  Case discussed with Care Management/Social Worker. Management plans discussed with the patient, family and they are in agreement.  CODE STATUS: Full DVT Prophylaxis: *Heparin TOTAL TIME TAKING CARE OF THIS PATIENT: 25 minutes.  >50% time spent on  counselling and coordination of care  POSSIBLE D/C IN one to 2 DAYS, DEPENDING ON CLINICAL CONDITION.  Note: This dictation was prepared with Dragon dictation along with smaller phrase technology. Any transcriptional errors that result from this process are unintentional.  Kassim Guertin M.D on 07/18/2016 at 2:36 PM  Between 7am to 6pm - Pager - [phone]  After 6pm go to [email or web address] - password [email or web address] [facility]  Office  [phone]  CC: Primary care physician; Tracie Harrier, MD

## 2016-07-18 NOTE — Progress Notes (Addendum)
CCPD started without issue. Dressing changed. Discussed with patient and family, low drain alarms, and how to maneuver and change positions before resetting machine. CCPD using one 2.5% bag and one 4.25% bag per MD. Communicated this to primary RN. Will disconnect first thing in AM. Patient is fairly new to PD. Discussed PD process and troubleshooting with family.

## 2016-07-18 NOTE — Progress Notes (Signed)
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 bilat effusions and bilateral central alveolar infiltrates c/w pulmonary edema 12/24 Nephrology consultation for mgmt of dialysis 12/24 Cardiology consultation 12/25 Echocardiogram:  12/25: Pt transferred out of ICU to telemetry unit 12/25: Pt transferred back to ICU due to acute respiratory failure secondary to pulmonary edema, therefore PCCM contacted to assist with management   SUBJECTIVE:  Currently resting comfortably on Bipap major complaint is abdominal tightness due to peritoneal dialysis treatment  VITAL SIGNS: BP (!) 151/72   Pulse (!) 106   Temp 97.5 F (36.4 C) (Axillary)   Resp 19   Ht 5' (1.524 m)   Wt 157 lb 10.1 oz (71.5 kg)   SpO2 94%   BMI 30.78 kg/m   HEMODYNAMICS:    VENTILATOR SETTINGS: FiO2 (%):  [40 %] 40 %  INTAKE / OUTPUT: I/O last 3 completed shifts: In: 935.5 [P.O.:720; I.V.:215.5] Out: 1117 [Other:1117]  PHYSICAL EXAMINATION: General: WDWN, NAD, cognition intact Neuro: alert and oriented, follows commands, PERRLA HEENT: supple, no JVD Cardiovascular: SInus tach, s1s2, no M/R/G Lungs: fine crackles bilateral bases, even, mildly tachypneic on Bipap Abdomen: Soft, NT, +BS Ext: minimal pretibial edema - symmetric Skin: no lesions noted  LABS:  BMET  Recent Labs Lab 07/16/16 0949 07/17/16 0742  NA 140 137  K 5.0 5.1  CL 104 103  CO2 22 23  BUN 92* 88*  CREATININE 13.85* 13.34*  GLUCOSE 120* 100*    Electrolytes  Recent Labs Lab 07/16/16 0949 07/17/16 0742  CALCIUM 7.3* 7.4*    CBC  Recent Labs Lab 07/16/16 0949 07/17/16 0742  WBC 11.1* 16.5*  HGB 10.2* 7.2*  HCT 30.0* 21.6*   PLT 205 183    Coag's  Recent Labs Lab 07/16/16 0952  APTT 33  INR 0.95    Sepsis Markers No results for input(s): LATICACIDVEN, PROCALCITON, O2SATVEN in the last 168 hours.  ABG  Recent Labs Lab 07/16/16 1035  PHART 7.27*  PCO2ART 38  PO2ART 113*    Liver Enzymes No results for input(s): AST, ALT, ALKPHOS, BILITOT, ALBUMIN in the last 168 hours.  Cardiac Enzymes  Recent Labs Lab 07/16/16 1415 07/16/16 1827 07/16/16 2345  TROPONINI 0.15* 0.14* 0.21*    Glucose  Recent Labs Lab 07/16/16 2115 07/17/16 0743 07/17/16 1118 07/17/16 1656 07/17/16 2059 07/17/16 2207  GLUCAP 298* 101* 166* 119* 142* 177*    CXR: CM, improved edema pattern  EKG: sinus tach, ant-lateral NSST changes    ASSESSMENT  Acute hypoxic respiratory failure Pulmonary edema Chest pain - description c/w angina  Sinus tachycardia Mildly elevated trop likely demand ischemia  Hypertension ESRD, chronic PD Chronic anemia of renal disease DM 2 - chronic Lantus and SSI - poorly controlled P:   Prn Bipap Prn Bronchodilators  Continue supplemental O2 to maintain SpO2 > 92% Cont ASA Continue amlodipine, lasix, and metoprolol Prn iv hydralazine for systolic bp >633 or diastolic >354 PD per Renal service Continue SSI, scheduled novolog, and lantus Prn Percocet and Tylenol for pain management   Merton Border, MD PCCM service Mobile [phone] Pager [phone] 07/18/2016

## 2016-07-18 NOTE — Progress Notes (Signed)
Manual PD drain of approximately 1342ml. Pt abdomen soft post drain. Pt has no c/o. Kolluru, MD aware.

## 2016-07-18 NOTE — Progress Notes (Signed)
[facility] Encounter Note  Patient: Bethany Lee / Admit Date: 07/16/2016 / Date of Encounter: 07/18/2016, 9:30 AM   Subjective: Patient is significantly improved overnight after increased dialysis and episode of hypoxia and sob and distress most consistant with chf and malignant htn. Shortness of breath and less hypoxia. No current evidence of distress today. Troponin elevation and 0.21 or consistent with demand ischemia.  Echocardiogram suggesting combined systolic and diastolic  LV dysfunction with slightly decreased ejection fraction  And severe lvh Review of Systems: Positive for: Shortness of breath weakness Negative for: Vision change, hearing change, syncope, dizziness, nausea, vomiting,diarrhea, bloody stool, stomach pain, cough, congestion, diaphoresis, urinary frequency, urinary pain,skin lesions, skin rashes Others previously listed  Objective: Telemetry: Normal sinus rhythm Physical Exam: Blood pressure (!) 166/79, pulse 99, temperature 100 F (37.8 C), temperature source Oral, resp. rate (!) 24, height 5' (1.524 m), weight 71.5 kg (157 lb 10.1 oz), SpO2 98 %. Body mass index is 30.78 kg/m. General: Well developed, well nourished, in no acute distress. Head: Normocephalic, atraumatic, sclera non-icteric, no xanthomas, nares are without discharge. Neck: No apparent masses Lungs: Normal respirations with no wheezes, no rhonchi, no rales , few crackles   Heart: Regular rate and rhythm, normal S1 S2, no murmur, no rub, no gallop, PMI is normal size and placement, carotid upstroke normal without bruit, jugular venous pressure normal Abdomen: Soft, non-tender, non-distended with normoactive bowel sounds. No hepatosplenomegaly. Abdominal aorta is normal size without bruit Extremities trace edema, no clubbing, no cyanosis, no ulcers,  Peripheral: 2+ radial, 2+ femoral, 2+ dorsal pedal pulses Neuro: Alert and oriented. Moves all extremities  spontaneously. Psych:  Responds to questions appropriately with a normal affect.   Intake/Output Summary (Last 24 hours) at 07/18/16 0930 Last data filed at 07/18/16 0900  Gross per 24 hour  Intake              810 ml  Output                0 ml  Net              810 ml    Inpatient Medications:  . [START ON 07/19/2016] amLODipine  10 mg Oral Daily  . aspirin  81 mg Oral Daily  . calcium acetate  2,001 mg Oral TID WC  . dialysis solution 2.5% low-MG/low-CA   Intraperitoneal Q24H  . furosemide  80 mg Oral BID  . gentamicin cream  1 application Topical Daily  . insulin aspart  0-15 Units Subcutaneous TID WC  . insulin aspart  0-5 Units Subcutaneous QHS  . insulin aspart  3 Units Subcutaneous TID WC  . insulin glargine  20 Units Subcutaneous QHS  . levothyroxine  50 mcg Oral QAC breakfast  . metolazone  5 mg Oral Daily  . metoprolol tartrate  50 mg Oral BID  . multivitamin  1 tablet Oral QHS  . pravastatin  40 mg Oral q1800  . sodium bicarbonate  1,300 mg Oral BID  . vitamin B-12  1,000 mcg Oral Daily   Infusions:    Labs:  Recent Labs  07/16/16 0949 07/17/16 0742  NA 140 137  K 5.0 5.1  CL 104 103  CO2 22 23  GLUCOSE 120* 100*  BUN 92* 88*  CREATININE 13.85* 13.34*  CALCIUM 7.3* 7.4*   No results for input(s): AST, ALT, ALKPHOS, BILITOT, PROT, ALBUMIN in the last 72 hours.  Recent Labs  07/16/16 [phone] 07/17/16 [phone]  WBC 11.1* 16.5*  HGB 10.2* 7.2*  HCT 30.0* 21.6*  MCV 90.6 90.7  PLT 205 183    Recent Labs  07/16/16 0949 07/16/16 1415 07/16/16 1827 07/16/16 2345  TROPONINI 0.16* 0.15* 0.14* 0.21*   Invalid input(s): POCBNP No results for input(s): HGBA1C in the last 72 hours.   Weights: Filed Weights   07/16/16 1400 07/16/16 1422 07/18/16 0431  Weight: 68.2 kg (150 lb 5.7 oz) 68.2 kg (150 lb 5.7 oz) 71.5 kg (157 lb 10.1 oz)     Radiology/Studies:  Dg Chest 1 View  Result Date: 07/17/2016 CLINICAL DATA:  Shortness of breath. EXAM: CHEST  1 VIEW COMPARISON:  Radiographs earlier this day at 0610 hour, CT yesterday. FINDINGS: Worsening bilateral suprahilar and diffuse interstitial opacities concerning for progressive edema. Trace pleural effusions. Cardiomegaly again seen. No pneumothorax or confluent airspace disease. There are old left rib fractures. IMPRESSION: Worsening pulmonary edema from prior exams. Electronically Signed   By: Jeb Levering M.D.   On: 07/17/2016 22:12   Ct Angio Chest Pe W And/or Wo Contrast  Result Date: 07/16/2016 CLINICAL DATA:  Chest pressure, shortness of breath, cough, onset of symptoms yesterday, nausea, became diaphoretic and tachypneic in ED, end-stage renal disease on dialysis, diabetes mellitus, hypertension EXAM: CT ANGIOGRAPHY CHEST WITH CONTRAST TECHNIQUE: Multidetector CT imaging of the chest was performed using the standard protocol during bolus administration of intravenous contrast. Multiplanar CT image reconstructions and MIPs were obtained to evaluate the vascular anatomy. CONTRAST:  75 cc Isovue 370 IV COMPARISON:  None FINDINGS: Cardiovascular: Atherosclerotic calcifications aorta, proximal great vessels and coronary arteries. Aorta normal caliber without aneurysm or dissection. No pericardial effusion. Pulmonary arteries well opacified and patent. No evidence of pulmonary embolism. Cardiac chambers appear mildly enlarged. Mediastinum/Nodes: 10 mm prevascular node image 22. Few additional normal sized mediastinal nodes. No additional thoracic adenopathy. Esophagus unremarkable. Base of cervical region normal appearance. Lungs/Pleura: BILATERAL pleural effusions and compressive atelectasis of the adjacent lungs. Infiltrates identified centrally in in all lobes question pulmonary edema though infection not excluded. No pneumothorax. Upper Abdomen: Unremarkable Musculoskeletal: Old LEFT rib fractures.  No acute osseous findings. Review of the MIP images confirms the above findings. IMPRESSION: No  evidence of pulmonary embolism. Aortic atherosclerosis and coronary arterial calcification. BILATERAL pleural effusions and central infiltrates throughout all lobes, favoring pulmonary edema over infection. Electronically Signed   By: Lavonia Dana M.D.   On: 07/16/2016 11:23   Dg Chest Port 1 View  Result Date: 07/18/2016 CLINICAL DATA:  Acute respiratory failure EXAM: PORTABLE CHEST 1 VIEW COMPARISON:  06/2023 17 chest CT, 07/17/2016 CXR FINDINGS: Portable AP semi upright view of the chest demonstrates cardiomegaly with aortic atherosclerosis. Interstitial edema with more confluent airspace opacities in the upper lobes are again seen without significant interval change. Slight blunting the costophrenic angles consistent small pleural effusions. No acute osseous appearing abnormality. The visualized cervical spine demonstrates degenerative disc disease and uncovertebral joint osteoarthritis. IMPRESSION: Cardiomegaly with interstitial edema. More confluent airspace opacities in the upper lobes are again noted which cannot entirely exclude pneumonia. Small bilateral pleural effusions. Electronically Signed   By: Ashley Royalty M.D.   On: 07/18/2016 03:49   Dg Chest Port 1 View  Result Date: 07/17/2016 CLINICAL DATA:  Followup of pulmonary edema. Diabetes. Hypertension. EXAM: PORTABLE CHEST 1 VIEW COMPARISON:  Plain films and CT of 1 day prior FINDINGS: Midline trachea. Moderate cardiomegaly. Atherosclerosis in the transverse aorta. Possible tiny residual left pleural effusion. No right pleural effusion or pneumothorax.  Resolved interstitial edema with mild pulmonary venous congestion remaining. No lobar consolidation. Nonacute lateral left rib fractures. IMPRESSION: Cardiomegaly with improved interstitial edema. Mild pulmonary venous congestion remains. Aortic atherosclerosis. Electronically Signed   By: Abigail Miyamoto M.D.   On: 07/17/2016 07:52   Dg Chest Portable 1 View  Result Date: 07/16/2016 CLINICAL  DATA:  Shortness of breath, chest pressure, cough and nausea beginning today. EXAM: PORTABLE CHEST 1 VIEW COMPARISON:  PA and lateral chest 12/08/2015. FINDINGS: There is cardiomegaly and pulmonary edema. No pneumothorax or pleural effusion. No acute bony abnormality. IMPRESSION: Congestive heart failure. Electronically Signed   By: Inge Rise M.D.   On: 07/16/2016 10:34     Assessment and Recommendation  64 y.o. 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 Continue dialysis with significant improvements in overall condition 2. No further intervention of minimal elevation of troponin more consistent with demand ischemia rather than acute coronary syndrome 3. Continue amlodipine for hypertension control with consideration of low-dose beta blocker for further risk reduction of future diastolic dysfunction heart failure if able to tolerate 4. Begin ambulation following for any further significant symptoms and adjustments of medication management 5. No further cardiac diagnostics necessary at this time 6. Possible cardiac cath if further concerns of ischemia arise Signed, Serafina Royals M.D. FACC

## 2016-07-19 DIAGNOSIS — Z23 Encounter for immunization: Secondary | ICD-10-CM | POA: Diagnosis not present

## 2016-07-19 DIAGNOSIS — N186 End stage renal disease: Secondary | ICD-10-CM | POA: Diagnosis not present

## 2016-07-19 DIAGNOSIS — Z992 Dependence on renal dialysis: Secondary | ICD-10-CM | POA: Diagnosis not present

## 2016-07-19 LAB — GLUCOSE, CAPILLARY
GLUCOSE-CAPILLARY: 72 mg/dL (ref 65–99)
GLUCOSE-CAPILLARY: 171 mg/dL — AB (ref 65–99)
Glucose-Capillary: 164 mg/dL — ABNORMAL HIGH (ref 65–99)
Glucose-Capillary: 105 mg/dL — ABNORMAL HIGH (ref 65–99)

## 2016-07-19 MED ORDER — HEPARIN 1000 UNIT/ML FOR PERITONEAL DIALYSIS
500.0000 [IU] | INTRAMUSCULAR | Status: DC | PRN
  Filled 2016-07-19: qty 0.5

## 2016-07-19 MED ORDER — CARVEDILOL 12.5 MG PO TABS
12.5000 mg | ORAL_TABLET | Freq: Two times a day (BID) | ORAL | Status: DC
  Administered 2016-07-19 – 2016-07-20 (×3): 12.5 mg via ORAL
  Filled 2016-07-19 (×2): qty 1
  Filled 2016-07-19: qty 2

## 2016-07-19 MED ORDER — GENTAMICIN SULFATE 0.1 % EX CREA
1.0000 "application " | TOPICAL_CREAM | Freq: Every day | CUTANEOUS | Status: DC
  Administered 2016-07-19: 1 via TOPICAL
  Filled 2016-07-19: qty 15

## 2016-07-19 NOTE — Progress Notes (Signed)
[place] [facility] [place] NAME: Bethany Lee    MR#:  [id number]  DATE OF BIRTH:  1951-06-15  SUBJECTIVE:   Came in with sob and resp distress. Transferred to ICU for BIPAP Feels better. Wants to go home Did not have to use BIPAP last nite REVIEW OF SYSTEMS:   Review of Systems  Constitutional: Negative for chills, fever and weight loss.  HENT: Negative for ear discharge, ear pain and nosebleeds.   Eyes: Negative for blurred vision, pain and discharge.  Respiratory: Positive for shortness of breath. Negative for sputum production, wheezing and stridor.   Cardiovascular: Positive for orthopnea. Negative for chest pain, palpitations and PND.  Gastrointestinal: Negative for abdominal pain, diarrhea, nausea and vomiting.  Genitourinary: Negative for frequency and urgency.  Musculoskeletal: Negative for back pain and joint pain.  Neurological: Positive for weakness. Negative for sensory change, speech change and focal weakness.  Psychiatric/Behavioral: Negative for depression and hallucinations. The patient is not nervous/anxious.    Tolerating Diet:yes Tolerating PT: ambulatory  DRUG ALLERGIES:   Allergies  Allergen Reactions  . Penicillins     VITALS:  Blood pressure 139/70, pulse 76, temperature 99 F (37.2 C), temperature source Oral, resp. rate 16, height 5' (1.524 m), weight 67.4 kg (148 lb 9.4 oz), SpO2 99 %.  PHYSICAL EXAMINATION:   Physical Exam  GENERAL:  65 y.o.-year-old patient lying in the bed with no acute distress.  EYES: Pupils equal, round, reactive to light and accommodation. No scleral icterus. Extraocular muscles intact.  HEENT: Head atraumatic, normocephalic. Oropharynx and nasopharynx clear.  NECK:  Supple, no jugular venous distention. No thyroid enlargement, no tenderness.  LUNGS: disant breath sounds bilaterally, no wheezing, rales, rhonchi. No use of accessory muscles of respiration.  CARDIOVASCULAR: S1,  S2 normal. No murmurs, rubs, or gallops.  ABDOMEN: Soft, nontender, nondistended. Bowel sounds present. No organomegaly or mass. PD cath+ EXTREMITIES: No cyanosis, clubbing or edema b/l.    NEUROLOGIC: Cranial nerves II through XII are intact. No focal Motor or sensory deficits b/l.   PSYCHIATRIC:  patient is alert and oriented x 3.  SKIN: No obvious rash, lesion, or ulcer.   LABORATORY PANEL:  CBC  Recent Labs Lab 07/17/16 0742  WBC 16.5*  HGB 7.2*  HCT 21.6*  PLT 183    Chemistries   Recent Labs Lab 07/18/16 0847  NA 135  K 4.7  CL 98*  CO2 24  GLUCOSE 92  BUN 90*  CREATININE 12.77*  CALCIUM 8.1*   Cardiac Enzymes  Recent Labs Lab 07/18/16 0847  TROPONINI 0.21*   RADIOLOGY:  Dg Chest 1 View  Result Date: 07/17/2016 CLINICAL DATA:  Shortness of breath. EXAM: CHEST 1 VIEW COMPARISON:  Radiographs earlier this day at 0610 hour, CT yesterday. FINDINGS: Worsening bilateral suprahilar and diffuse interstitial opacities concerning for progressive edema. Trace pleural effusions. Cardiomegaly again seen. No pneumothorax or confluent airspace disease. There are old left rib fractures. IMPRESSION: Worsening pulmonary edema from prior exams. Electronically Signed   By: Jeb Levering M.D.   On: 07/17/2016 22:12   Dg Chest Port 1 View  Result Date: 07/18/2016 CLINICAL DATA:  Acute respiratory failure EXAM: PORTABLE CHEST 1 VIEW COMPARISON:  06/2023 17 chest CT, 07/17/2016 CXR FINDINGS: Portable AP semi upright view of the chest demonstrates cardiomegaly with aortic atherosclerosis. Interstitial edema with more confluent airspace opacities in the upper lobes are again seen without significant interval change. Slight blunting the costophrenic angles consistent small pleural  effusions. No acute osseous appearing abnormality. The visualized cervical spine demonstrates degenerative disc disease and uncovertebral joint osteoarthritis. IMPRESSION: Cardiomegaly with interstitial  edema. More confluent airspace opacities in the upper lobes are again noted which cannot entirely exclude pneumonia. Small bilateral pleural effusions. Electronically Signed   By: Ashley Royalty M.D.   On: 07/18/2016 03:49   ASSESSMENT AND PLAN:  65 F with ESRD due to DM and hypertension, on peritoneal dialysis since 01/2015 admitted with CP and respiratory distress due to pulmonary edema  1,Acute hypoxic respiratory failure due to Pulmonary edema -Patient was transferred back to the ICU placed on BiPAP. Currently she is on 2 L nasal cannula sats 96%. -continue her Lasix 80 mg twice a day -Patient say she feels a lot better. -transfer to Tele  2. Chest pain - description c/w angina  -no plans for cath cardiology   3. Sinus tachycardia improved  4. Mildly elevated trop likely demand ischemia in the setting of pulmonary edema no chest pain. - No acute EKG changes  5. Hypertension Continue metoprolol, hydralazine, Lasix, amlodipine, Coreg  6. ESRD, chronic PD -Followed by nephrology  7. DM 2 - chronic Lantus and SSI - poorly controlled  Ambulate on the floor D/c in am if continues to show improvement  Case discussed with Care Management/Social Worker. Management plans discussed with the patient, family and they are in agreement.  CODE STATUS: Full DVT Prophylaxis: *Heparin  TOTAL TIME TAKING CARE OF THIS PATIENT: 25 minutes.  >50% time spent on counselling and coordination of care  POSSIBLE D/C IN one DAY DEPENDING ON CLINICAL CONDITION.  Note: This dictation was prepared with Dragon dictation along with smaller phrase technology. Any transcriptional errors that result from this process are unintentional.  Tykia Mellone M.D on 07/19/2016 at 8:44 AM  Between 7am to 6pm - Pager - [phone]  After 6pm go to [email or web address] - password [email or web address]  Office  [phone]  CC: Primary care physician; Tracie Harrier, MD

## 2016-07-19 NOTE — Progress Notes (Signed)
Pt PD end. Weight was 67.3 kg. 1485 ml total UF removed. Report from [employer].

## 2016-07-19 NOTE — Progress Notes (Signed)
Pt is very comfortable on [place] O2 and has orders to transfer to telemetry floor. PCCM will sign off. Please call if we can be of further assistance  Merton Border, MD PCCM service Mobile [phone] Pager [phone] 07/19/2016

## 2016-07-19 NOTE — Progress Notes (Signed)
Report given to Janett Billow, RN on 2A. Patient being transferred to room 237. RN aware that patient still needs to ambulate per MD order and that patient is no 1210ml Fluid restriction. Care to be transferred.

## 2016-07-19 NOTE — Progress Notes (Signed)
Patient left the unit via bed and transported to 2A, CCMD called and made aware, patient placed on cardiac monitoring prior to transport to floor. Belongings sent with patient- Family and patient previously reminded of belongings/valuables policy. Patient declined to send cell phone or any Christmas gifts home at this time.

## 2016-07-19 NOTE — Progress Notes (Signed)
Post PD assessment 

## 2016-07-19 NOTE — Progress Notes (Signed)
[facility] Encounter Note  Patient: Bethany Lee / Admit Date: 07/16/2016 / Date of Encounter: 07/19/2016, 8:15 AM   Subjective: Patient is significantly improved overnight after increased dialysis and episode of hypoxia and sob and distress most consistant with chf and malignant htn. Shortness of breath and less hypoxia today with additional medication management. No current evidence of distress today. Troponin elevation and 0.21 or consistent with demand ischemia. And still no symptoms of the myocardial infarction   Echocardiogram suggesting combined systolic and diastolic  LV dysfunction with slightly decreased ejection fraction  And severe lvh Review of Systems: Positive for: Shortness of breath weakness but improved Negative for: Vision change, hearing change, syncope, dizziness, nausea, vomiting,diarrhea, bloody stool, stomach pain, cough, congestion, diaphoresis, urinary frequency, urinary pain,skin lesions, skin rashes Others previously listed  Objective: Telemetry: Normal sinus rhythm Physical Exam: Blood pressure 139/70, pulse 76, temperature 99 F (37.2 C), temperature source Oral, resp. rate 16, height 5' (1.524 m), weight 67.4 kg (148 lb 9.4 oz), SpO2 99 %. Body mass index is 29.02 kg/m. General: Well developed, well nourished, in no acute distress. Head: Normocephalic, atraumatic, sclera non-icteric, no xanthomas, nares are without discharge. Neck: No apparent masses Lungs: Normal respirations with no wheezes, no rhonchi, some rales , few crackles   Heart: Regular rate and rhythm, normal S1 S2, no murmur, no rub, no gallop, PMI is normal size and placement, carotid upstroke normal without bruit, jugular venous pressure normal Abdomen: Soft, non-tender, non-distended with normoactive bowel sounds. No hepatosplenomegaly. Abdominal aorta is normal size without bruit Extremities trace edema, no clubbing, no cyanosis, no ulcers,  Peripheral: 2+ radial,  2+ femoral, 2+ dorsal pedal pulses Neuro: Alert and oriented. Moves all extremities spontaneously. Psych:  Responds to questions appropriately with a normal affect.   Intake/Output Summary (Last 24 hours) at 07/19/16 0815 Last data filed at 07/19/16 0710  Gross per 24 hour  Intake              330 ml  Output             1485 ml  Net            -1155 ml    Inpatient Medications:  . amLODipine  10 mg Oral Daily  . aspirin  81 mg Oral Daily  . calcium acetate  2,001 mg Oral TID WC  . carvedilol  12.5 mg Oral BID WC  . dialysis solution 2.5% low-MG/low-CA   Intraperitoneal Q24H  . furosemide  80 mg Oral BID  . gentamicin cream  1 application Topical Daily  . heparin subcutaneous  5,000 Units Subcutaneous Q8H  . insulin aspart  0-15 Units Subcutaneous TID WC  . insulin aspart  0-5 Units Subcutaneous QHS  . insulin aspart  3 Units Subcutaneous TID WC  . insulin glargine  20 Units Subcutaneous QHS  . levothyroxine  50 mcg Oral QAC breakfast  . metolazone  5 mg Oral Daily  . multivitamin  1 tablet Oral QHS  . pravastatin  40 mg Oral q1800  . vitamin B-12  1,000 mcg Oral Daily   Infusions:    Labs:  Recent Labs  07/17/16 0742 07/18/16 0847  NA 137 135  K 5.1 4.7  CL 103 98*  CO2 23 24  GLUCOSE 100* 92  BUN 88* 90*  CREATININE 13.34* 12.77*  CALCIUM 7.4* 8.1*   No results for input(s): AST, ALT, ALKPHOS, BILITOT, PROT, ALBUMIN in the last 72 hours.  Recent Labs  07/16/16 0949 07/17/16 0742  WBC 11.1* 16.5*  HGB 10.2* 7.2*  HCT 30.0* 21.6*  MCV 90.6 90.7  PLT 205 183    Recent Labs  07/16/16 1415 07/16/16 1827 07/16/16 2345 07/18/16 0847  TROPONINI 0.15* 0.14* 0.21* 0.21*   Invalid input(s): POCBNP No results for input(s): HGBA1C in the last 72 hours.   Weights: Filed Weights   07/18/16 0431 07/18/16 1700 07/19/16 0227  Weight: 71.5 kg (157 lb 10.1 oz) 71.6 kg (157 lb 13.6 oz) 67.4 kg (148 lb 9.4 oz)     Radiology/Studies:  Dg Chest 1  View  Result Date: 07/17/2016 CLINICAL DATA:  Shortness of breath. EXAM: CHEST 1 VIEW COMPARISON:  Radiographs earlier this day at 0610 hour, CT yesterday. FINDINGS: Worsening bilateral suprahilar and diffuse interstitial opacities concerning for progressive edema. Trace pleural effusions. Cardiomegaly again seen. No pneumothorax or confluent airspace disease. There are old left rib fractures. IMPRESSION: Worsening pulmonary edema from prior exams. Electronically Signed   By: Jeb Levering M.D.   On: 07/17/2016 22:12   Ct Angio Chest Pe W And/or Wo Contrast  Result Date: 07/16/2016 CLINICAL DATA:  Chest pressure, shortness of breath, cough, onset of symptoms yesterday, nausea, became diaphoretic and tachypneic in ED, end-stage renal disease on dialysis, diabetes mellitus, hypertension EXAM: CT ANGIOGRAPHY CHEST WITH CONTRAST TECHNIQUE: Multidetector CT imaging of the chest was performed using the standard protocol during bolus administration of intravenous contrast. Multiplanar CT image reconstructions and MIPs were obtained to evaluate the vascular anatomy. CONTRAST:  75 cc Isovue 370 IV COMPARISON:  None FINDINGS: Cardiovascular: Atherosclerotic calcifications aorta, proximal great vessels and coronary arteries. Aorta normal caliber without aneurysm or dissection. No pericardial effusion. Pulmonary arteries well opacified and patent. No evidence of pulmonary embolism. Cardiac chambers appear mildly enlarged. Mediastinum/Nodes: 10 mm prevascular node image 22. Few additional normal sized mediastinal nodes. No additional thoracic adenopathy. Esophagus unremarkable. Base of cervical region normal appearance. Lungs/Pleura: BILATERAL pleural effusions and compressive atelectasis of the adjacent lungs. Infiltrates identified centrally in in all lobes question pulmonary edema though infection not excluded. No pneumothorax. Upper Abdomen: Unremarkable Musculoskeletal: Old LEFT rib fractures.  No acute osseous  findings. Review of the MIP images confirms the above findings. IMPRESSION: No evidence of pulmonary embolism. Aortic atherosclerosis and coronary arterial calcification. BILATERAL pleural effusions and central infiltrates throughout all lobes, favoring pulmonary edema over infection. Electronically Signed   By: Lavonia Dana M.D.   On: 07/16/2016 11:23   Dg Chest Port 1 View  Result Date: 07/18/2016 CLINICAL DATA:  Acute respiratory failure EXAM: PORTABLE CHEST 1 VIEW COMPARISON:  06/2023 17 chest CT, 07/17/2016 CXR FINDINGS: Portable AP semi upright view of the chest demonstrates cardiomegaly with aortic atherosclerosis. Interstitial edema with more confluent airspace opacities in the upper lobes are again seen without significant interval change. Slight blunting the costophrenic angles consistent small pleural effusions. No acute osseous appearing abnormality. The visualized cervical spine demonstrates degenerative disc disease and uncovertebral joint osteoarthritis. IMPRESSION: Cardiomegaly with interstitial edema. More confluent airspace opacities in the upper lobes are again noted which cannot entirely exclude pneumonia. Small bilateral pleural effusions. Electronically Signed   By: Ashley Royalty M.D.   On: 07/18/2016 03:49   Dg Chest Port 1 View  Result Date: 07/17/2016 CLINICAL DATA:  Followup of pulmonary edema. Diabetes. Hypertension. EXAM: PORTABLE CHEST 1 VIEW COMPARISON:  Plain films and CT of 1 day prior FINDINGS: Midline trachea. Moderate cardiomegaly. Atherosclerosis in the transverse aorta. Possible tiny residual left pleural effusion. No  right pleural effusion or pneumothorax. Resolved interstitial edema with mild pulmonary venous congestion remaining. No lobar consolidation. Nonacute lateral left rib fractures. IMPRESSION: Cardiomegaly with improved interstitial edema. Mild pulmonary venous congestion remains. Aortic atherosclerosis. Electronically Signed   By: Abigail Miyamoto M.D.   On:  07/17/2016 07:52   Dg Chest Portable 1 View  Result Date: 07/16/2016 CLINICAL DATA:  Shortness of breath, chest pressure, cough and nausea beginning today. EXAM: PORTABLE CHEST 1 VIEW COMPARISON:  PA and lateral chest 12/08/2015. FINDINGS: There is cardiomegaly and pulmonary edema. No pneumothorax or pleural effusion. No acute bony abnormality. IMPRESSION: Congestive heart failure. Electronically Signed   By: Inge Rise M.D.   On: 07/16/2016 10:34     Assessment and Recommendation  65 y.o. 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 Continue dialysis with significant improvements in overall condition 2. No further intervention of minimal elevation of troponin more consistent with demand ischemia rather than acute coronary syndrome 3. Continue amlodipine for hypertension control with Increased of beta blocker to higher dose for better heart rate and blood pressure control today 4. Begin ambulation following for any further significant symptoms and adjustments of medication management 5. No further cardiac diagnostics necessary at this time 6. Possible cardiac cath if further concerns of ischemia arise 7. Okay for transfer to telemetry and begin that ambulation Signed, Serafina Royals M.D. FACC

## 2016-07-19 NOTE — Progress Notes (Signed)
[facility]  ROUNDING NOTE   Subjective:   Laying in bed. States she is breathing better.   PD last night UF 1485   Objective:  Vital signs in last 24 hours:  Temp:  [98.6 F (37 C)-99.7 F (37.6 C)] 99 F (37.2 C) (12/27 0700) Pulse Rate:  [74-99] 76 (12/27 0400) Resp:  [14-26] 16 (12/27 0400) BP: (129-172)/(60-86) 139/70 (12/27 0400) SpO2:  [93 %-99 %] 99 % (12/27 0400) Weight:  [67.4 kg (148 lb 9.4 oz)-71.6 kg (157 lb 13.6 oz)] 67.4 kg (148 lb 9.4 oz) (12/27 0227)  Weight change: 0.1 kg (3.5 oz) Filed Weights   07/18/16 0431 07/18/16 1700 07/19/16 0227  Weight: 71.5 kg (157 lb 10.1 oz) 71.6 kg (157 lb 13.6 oz) 67.4 kg (148 lb 9.4 oz)    Intake/Output: I/O last 3 completed shifts: In: 330 [P.O.:330] Out: 0    Intake/Output this shift:  Total I/O In: -  Out: 1485 [Other:1485]  Physical Exam: General: No acute distress   Head: Normocephalic, atraumatic. Moist oral mucosal membranes  Eyes: Anicteric  Neck: Supple, trachea midline  Lungs:  clear, normal effort   Heart: S1S2 no rubs  Abdomen:  Soft, nontender, bowel sounds present  Extremities: No peripheral edema.  Neurologic: Nonfocal, moving all four extremities  Skin: No lesions  Access: PD catheter in place    Basic Metabolic Panel:  Recent Labs Lab 07/16/16 0949 07/17/16 0742 07/18/16 0847  NA 140 137 135  K 5.0 5.1 4.7  CL 104 103 98*  CO2 22 23 24   GLUCOSE 120* 100* 92  BUN 92* 88* 90*  CREATININE 13.85* 13.34* 12.77*  CALCIUM 7.3* 7.4* 8.1*    Liver Function Tests: No results for input(s): AST, ALT, ALKPHOS, BILITOT, PROT, ALBUMIN in the last 168 hours. No results for input(s): LIPASE, AMYLASE in the last 168 hours. No results for input(s): AMMONIA in the last 168 hours.  CBC:  Recent Labs Lab 07/16/16 0949 07/17/16 0742  WBC 11.1* 16.5*  HGB 10.2* 7.2*  HCT 30.0* 21.6*  MCV 90.6 90.7  PLT 205 183    Cardiac Enzymes:  Recent Labs Lab 07/16/16 0949  07/16/16 1415 07/16/16 1827 07/16/16 2345 07/18/16 0847  TROPONINI 0.16* 0.15* 0.14* 0.21* 0.21*    BNP: Invalid input(s): POCBNP  CBG:  Recent Labs Lab 07/18/16 0811 07/18/16 1204 07/18/16 1605 07/18/16 2158 07/19/16 0717  GLUCAP 116* 76 154* 163* 40    Microbiology: Results for orders placed or performed during the hospital encounter of 07/16/16  MRSA PCR Screening     Status: None   Collection Time: 07/16/16  2:09 PM  Result Value Ref Range Status   MRSA by PCR NEGATIVE NEGATIVE Final    Comment:        The GeneXpert MRSA Assay (FDA approved for NASAL specimens only), is one component of a comprehensive MRSA colonization surveillance program. It is not intended to diagnose MRSA infection nor to guide or monitor treatment for MRSA infections.     Coagulation Studies:  Recent Labs  07/16/16 0952  LABPROT 12.7  INR 0.95    Urinalysis: No results for input(s): COLORURINE, LABSPEC, PHURINE, GLUCOSEU, HGBUR, BILIRUBINUR, KETONESUR, PROTEINUR, UROBILINOGEN, NITRITE, LEUKOCYTESUR in the last 72 hours.  Invalid input(s): APPERANCEUR    Imaging: Dg Chest 1 View  Result Date: 07/17/2016 CLINICAL DATA:  Shortness of breath. EXAM: CHEST 1 VIEW COMPARISON:  Radiographs earlier this day at 0610 hour, CT yesterday. FINDINGS: Worsening bilateral suprahilar and diffuse interstitial opacities  concerning for progressive edema. Trace pleural effusions. Cardiomegaly again seen. No pneumothorax or confluent airspace disease. There are old left rib fractures. IMPRESSION: Worsening pulmonary edema from prior exams. Electronically Signed   By: Jeb Levering M.D.   On: 07/17/2016 22:12   Dg Chest Port 1 View  Result Date: 07/18/2016 CLINICAL DATA:  Acute respiratory failure EXAM: PORTABLE CHEST 1 VIEW COMPARISON:  06/2023 17 chest CT, 07/17/2016 CXR FINDINGS: Portable AP semi upright view of the chest demonstrates cardiomegaly with aortic atherosclerosis. Interstitial  edema with more confluent airspace opacities in the upper lobes are again seen without significant interval change. Slight blunting the costophrenic angles consistent small pleural effusions. No acute osseous appearing abnormality. The visualized cervical spine demonstrates degenerative disc disease and uncovertebral joint osteoarthritis. IMPRESSION: Cardiomegaly with interstitial edema. More confluent airspace opacities in the upper lobes are again noted which cannot entirely exclude pneumonia. Small bilateral pleural effusions. Electronically Signed   By: Ashley Royalty M.D.   On: 07/18/2016 03:49     Medications:    . amLODipine  10 mg Oral Daily  . aspirin  81 mg Oral Daily  . calcium acetate  2,001 mg Oral TID WC  . carvedilol  12.5 mg Oral BID WC  . dialysis solution 2.5% low-MG/low-CA   Intraperitoneal Q24H  . furosemide  80 mg Oral BID  . gentamicin cream  1 application Topical Daily  . heparin subcutaneous  5,000 Units Subcutaneous Q8H  . insulin aspart  0-15 Units Subcutaneous TID WC  . insulin aspart  0-5 Units Subcutaneous QHS  . insulin aspart  3 Units Subcutaneous TID WC  . insulin glargine  20 Units Subcutaneous QHS  . levothyroxine  50 mcg Oral QAC breakfast  . metolazone  5 mg Oral Daily  . multivitamin  1 tablet Oral QHS  . pravastatin  40 mg Oral q1800  . vitamin B-12  1,000 mcg Oral Daily   sodium chloride, acetaminophen, docusate sodium, heparin, hydrALAZINE, ipratropium-albuterol, metoprolol, nitroGLYCERIN, ondansetron (ZOFRAN) IV  Assessment/ Plan:  65 y.o. female with diabetes mellitus type 2, diabetic neuropathy, anemia, hypertension, hyperlipidemia, diabetic nephropathy, ESRD due to DM,  PD catheter placed 11/25/15, anemia of CKD, SHPTH, admission for respiratory failure 07/16/16  [facility]/PD CCPD 9 hours 4 exchanges 1739mL  1.  End-stage renal disease on peritoneal dialysis 6 nights a week. - Changed to 7 nights per week.  - Discontinued sodium bicarbonate - 4.25%  dextrose last night, will transition to 2.5% dextrose tonight   2. Acute respiratory failure. Multifactorial with contributions from missed antihypertensives, high salt diet and inadequate dialysis.  - discussed low salt diet  3. Hypertension:  - started on carvedilol - Continue amlodipine, furosemide, metolazone  3. Anemia of chronic kidney disease. Hemoglobin 7.2 - epo as outpatient - hold due to chest pain.   4. Secondary hyperparathyroidism.   - calcium acetate   LOS: [place], Jamion Carter 12/27/20178:52 AM

## 2016-07-20 DIAGNOSIS — Z992 Dependence on renal dialysis: Secondary | ICD-10-CM | POA: Diagnosis not present

## 2016-07-20 DIAGNOSIS — N186 End stage renal disease: Secondary | ICD-10-CM | POA: Diagnosis not present

## 2016-07-20 DIAGNOSIS — Z23 Encounter for immunization: Secondary | ICD-10-CM | POA: Diagnosis not present

## 2016-07-20 LAB — BODY FLUID CELL COUNT WITH DIFFERENTIAL
Eos, Fluid: 1 %
LYMPHS FL: 9 %
MONOCYTE-MACROPHAGE-SEROUS FLUID: 72 %
NEUTROPHIL FLUID: 18 %
WBC FLUID: 9 uL

## 2016-07-20 LAB — CBC
HEMATOCRIT: 24.6 % — AB (ref 35.0–47.0)
HEMOGLOBIN: 8.2 g/dL — AB (ref 12.0–16.0)
MCH: 30.3 pg (ref 26.0–34.0)
MCHC: 33.6 g/dL (ref 32.0–36.0)
MCV: 90.4 fL (ref 80.0–100.0)
Platelets: 224 10*3/uL (ref 150–440)
RBC: 2.72 MIL/uL — AB (ref 3.80–5.20)
RDW: 14.9 % — ABNORMAL HIGH (ref 11.5–14.5)
WBC: 9.9 10*3/uL (ref 3.6–11.0)

## 2016-07-20 LAB — GLUCOSE, CAPILLARY: Glucose-Capillary: 222 mg/dL — ABNORMAL HIGH (ref 65–99)

## 2016-07-20 LAB — PATHOLOGIST SMEAR REVIEW

## 2016-07-20 MED ORDER — CIPROFLOXACIN HCL 500 MG PO TABS
500.0000 mg | ORAL_TABLET | Freq: Once | ORAL | Status: AC
  Administered 2016-07-20: 500 mg via ORAL
  Filled 2016-07-20: qty 1

## 2016-07-20 MED ORDER — CARVEDILOL 12.5 MG PO TABS: 12.5000 mg | ORAL_TABLET | Freq: Two times a day (BID) | ORAL | 0 refills | Status: DC

## 2016-07-20 NOTE — Discharge Instructions (Signed)
Cont your PD as before.Marland KitchenMarland Kitchen

## 2016-07-20 NOTE — Progress Notes (Signed)
[facility] Encounter Note  Patient: Bethany Lee / Admit Date: 07/16/2016 / Date of Encounter: 07/20/2016, 8:22 AM   Subjective: Patient is significantly improved   after increased dialysis and episode of hypoxia and sob and distress most consistant with chf and malignant htn with fluid overload. Shortness of breath and less hypoxia today with additional medication management. No current evidence of distress today. Troponin elevation and 0.21 or consistent with demand ischemia. And still no symptoms of the myocardial infarction   Echocardiogram suggesting combined systolic and diastolic  LV dysfunction with slightly decreased ejection fraction  And severe lvh clinically stable today Review of Systems: Positive for: Shortness of breath weakness but improved Negative for: Vision change, hearing change, syncope, dizziness, nausea, vomiting,diarrhea, bloody stool, stomach pain, cough, congestion, diaphoresis, urinary frequency, urinary pain,skin lesions, skin rashes Others previously listed  Objective: Telemetry: Normal sinus rhythm Physical Exam: Blood pressure (!) 156/60, pulse 79, temperature 97.6 F (36.4 C), resp. rate 16, height 5' (1.524 m), weight 68.6 kg (151 lb 3.8 oz), SpO2 92 %. Body mass index is 29.54 kg/m. General: Well developed, well nourished, in no acute distress. Head: Normocephalic, atraumatic, sclera non-icteric, no xanthomas, nares are without discharge. Neck: No apparent masses Lungs: Normal respirations with no wheezes, no rhonchi, some rales , few crackles   Heart: Regular rate and rhythm, normal S1 S2, no murmur, no rub, no gallop, PMI is normal size and placement, carotid upstroke normal without bruit, jugular venous pressure normal Abdomen: Soft, non-tender, non-distended with normoactive bowel sounds. No hepatosplenomegaly. Abdominal aorta is normal size without bruit Extremities trace edema, no clubbing, no cyanosis, no ulcers,   Peripheral: 2+ radial, 2+ femoral, 2+ dorsal pedal pulses Neuro: Alert and oriented. Moves all extremities spontaneously. Psych:  Responds to questions appropriately with a normal affect.   Intake/Output Summary (Last 24 hours) at 07/20/16 3704 Last data filed at 07/20/16 0444  Gross per 24 hour  Intake              340 ml  Output              375 ml  Net              -35 ml    Inpatient Medications:  . amLODipine  10 mg Oral Daily  . aspirin  81 mg Oral Daily  . calcium acetate  2,001 mg Oral TID WC  . carvedilol  12.5 mg Oral BID WC  . dialysis solution 2.5% low-MG/low-CA   Intraperitoneal Q24H  . furosemide  80 mg Oral BID  . gentamicin cream  1 application Topical Daily  . gentamicin cream  1 application Topical Daily  . heparin subcutaneous  5,000 Units Subcutaneous Q8H  . insulin aspart  0-15 Units Subcutaneous TID WC  . insulin aspart  0-5 Units Subcutaneous QHS  . insulin aspart  3 Units Subcutaneous TID WC  . insulin glargine  20 Units Subcutaneous QHS  . levothyroxine  50 mcg Oral QAC breakfast  . metolazone  5 mg Oral Daily  . multivitamin  1 tablet Oral QHS  . pravastatin  40 mg Oral q1800  . vitamin B-12  1,000 mcg Oral Daily   Infusions:    Labs:  Recent Labs  07/18/16 0847  NA 135  K 4.7  CL 98*  CO2 24  GLUCOSE 92  BUN 90*  CREATININE 12.77*  CALCIUM 8.1*   No results for input(s): AST, ALT, ALKPHOS, BILITOT, PROT, ALBUMIN in  the last 72 hours.  Recent Labs  07/20/16 0703  WBC 9.9  HGB 8.2*  HCT 24.6*  MCV 90.4  PLT 224    Recent Labs  07/18/16 0847  TROPONINI 0.21*   Invalid input(s): POCBNP No results for input(s): HGBA1C in the last 72 hours.   Weights: Filed Weights   07/18/16 1700 07/19/16 0227 07/20/16 0441  Weight: 71.6 kg (157 lb 13.6 oz) 67.4 kg (148 lb 9.4 oz) 68.6 kg (151 lb 3.8 oz)     Radiology/Studies:  Dg Chest 1 View  Result Date: 07/17/2016 CLINICAL DATA:  Shortness of breath. EXAM: CHEST 1 VIEW  COMPARISON:  Radiographs earlier this day at 0610 hour, CT yesterday. FINDINGS: Worsening bilateral suprahilar and diffuse interstitial opacities concerning for progressive edema. Trace pleural effusions. Cardiomegaly again seen. No pneumothorax or confluent airspace disease. There are old left rib fractures. IMPRESSION: Worsening pulmonary edema from prior exams. Electronically Signed   By: Jeb Levering M.D.   On: 07/17/2016 22:12   Ct Angio Chest Pe W And/or Wo Contrast  Result Date: 07/16/2016 CLINICAL DATA:  Chest pressure, shortness of breath, cough, onset of symptoms yesterday, nausea, became diaphoretic and tachypneic in ED, end-stage renal disease on dialysis, diabetes mellitus, hypertension EXAM: CT ANGIOGRAPHY CHEST WITH CONTRAST TECHNIQUE: Multidetector CT imaging of the chest was performed using the standard protocol during bolus administration of intravenous contrast. Multiplanar CT image reconstructions and MIPs were obtained to evaluate the vascular anatomy. CONTRAST:  75 cc Isovue 370 IV COMPARISON:  None FINDINGS: Cardiovascular: Atherosclerotic calcifications aorta, proximal great vessels and coronary arteries. Aorta normal caliber without aneurysm or dissection. No pericardial effusion. Pulmonary arteries well opacified and patent. No evidence of pulmonary embolism. Cardiac chambers appear mildly enlarged. Mediastinum/Nodes: 10 mm prevascular node image 22. Few additional normal sized mediastinal nodes. No additional thoracic adenopathy. Esophagus unremarkable. Base of cervical region normal appearance. Lungs/Pleura: BILATERAL pleural effusions and compressive atelectasis of the adjacent lungs. Infiltrates identified centrally in in all lobes question pulmonary edema though infection not excluded. No pneumothorax. Upper Abdomen: Unremarkable Musculoskeletal: Old LEFT rib fractures.  No acute osseous findings. Review of the MIP images confirms the above findings. IMPRESSION: No evidence of  pulmonary embolism. Aortic atherosclerosis and coronary arterial calcification. BILATERAL pleural effusions and central infiltrates throughout all lobes, favoring pulmonary edema over infection. Electronically Signed   By: Lavonia Dana M.D.   On: 07/16/2016 11:23   Dg Chest Port 1 View  Result Date: 07/18/2016 CLINICAL DATA:  Acute respiratory failure EXAM: PORTABLE CHEST 1 VIEW COMPARISON:  06/2023 17 chest CT, 07/17/2016 CXR FINDINGS: Portable AP semi upright view of the chest demonstrates cardiomegaly with aortic atherosclerosis. Interstitial edema with more confluent airspace opacities in the upper lobes are again seen without significant interval change. Slight blunting the costophrenic angles consistent small pleural effusions. No acute osseous appearing abnormality. The visualized cervical spine demonstrates degenerative disc disease and uncovertebral joint osteoarthritis. IMPRESSION: Cardiomegaly with interstitial edema. More confluent airspace opacities in the upper lobes are again noted which cannot entirely exclude pneumonia. Small bilateral pleural effusions. Electronically Signed   By: Ashley Royalty M.D.   On: 07/18/2016 03:49   Dg Chest Port 1 View  Result Date: 07/17/2016 CLINICAL DATA:  Followup of pulmonary edema. Diabetes. Hypertension. EXAM: PORTABLE CHEST 1 VIEW COMPARISON:  Plain films and CT of 1 day prior FINDINGS: Midline trachea. Moderate cardiomegaly. Atherosclerosis in the transverse aorta. Possible tiny residual left pleural effusion. No right pleural effusion or pneumothorax. Resolved interstitial edema  with mild pulmonary venous congestion remaining. No lobar consolidation. Nonacute lateral left rib fractures. IMPRESSION: Cardiomegaly with improved interstitial edema. Mild pulmonary venous congestion remains. Aortic atherosclerosis. Electronically Signed   By: Abigail Miyamoto M.D.   On: 07/17/2016 07:52   Dg Chest Portable 1 View  Result Date: 07/16/2016 CLINICAL DATA:   Shortness of breath, chest pressure, cough and nausea beginning today. EXAM: PORTABLE CHEST 1 VIEW COMPARISON:  PA and lateral chest 12/08/2015. FINDINGS: There is cardiomegaly and pulmonary edema. No pneumothorax or pleural effusion. No acute bony abnormality. IMPRESSION: Congestive heart failure. Electronically Signed   By: Inge Rise M.D.   On: 07/16/2016 10:34     Assessment and Recommendation  65 y.o. 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 Continue dialysis with significant improvements in overall condition 2. No further intervention of minimal elevation of troponin more consistent with demand ischemia rather than acute coronary syndrome 3. Continue amlodipine for hypertension control with  beta blocker to higher dose for better heart rate and blood pressure control today 4. Begin ambulation following for any further significant symptoms and adjustments of medication management 5. No further cardiac diagnostics necessary at this time 6.  Okay for discharge to home from cardiac standpoint with follow-up next week with adjustments of medication management 7. Okay for discharge to home Signed, Serafina Royals M.D. FACC

## 2016-07-20 NOTE — Progress Notes (Signed)
Post PD assessment 

## 2016-07-20 NOTE — Progress Notes (Signed)
[facility]  ROUNDING NOTE   Subjective:   PD catheter contamination last night. PO Cipro given. PD cultures and cell count done.   Family very upset about this.   UF 1975   Objective:  Vital signs in last 24 hours:  Temp:  [97.6 F (36.4 C)-98.7 F (37.1 C)] 97.6 F (36.4 C) (12/28 0441) Pulse Rate:  [74-91] 79 (12/28 0441) Resp:  [14-18] 16 (12/28 0441) BP: (120-161)/(52-74) 156/60 (12/28 0441) SpO2:  [92 %-98 %] 92 % (12/28 0441) Weight:  [68.6 kg (151 lb 3.8 oz)] 68.6 kg (151 lb 3.8 oz) (12/28 0441)  Weight change: -3 kg (-6 lb 9.8 oz) Filed Weights   07/18/16 1700 07/19/16 0227 07/20/16 0441  Weight: 71.6 kg (157 lb 13.6 oz) 67.4 kg (148 lb 9.4 oz) 68.6 kg (151 lb 3.8 oz)    Intake/Output: I/O last 3 completed shifts: In: 340 [P.O.:340] Out: 1860 [Urine:375; ZOXWR:6045]   Intake/Output this shift:  Total I/O In: -  Out: 1970 [Other:1970]  Physical Exam: General: No acute distress   Head: Normocephalic, atraumatic. Moist oral mucosal membranes  Eyes: Anicteric  Neck: Supple, trachea midline  Lungs:  clear, normal effort   Heart: S1S2 no rubs  Abdomen:  Soft, nontender, bowel sounds present  Extremities: No peripheral edema.  Neurologic: Nonfocal, moving all four extremities  Skin: No lesions  Access: PD catheter in place    Basic Metabolic Panel:  Recent Labs Lab 07/16/16 0949 07/17/16 0742 07/18/16 0847  NA 140 137 135  K 5.0 5.1 4.7  CL 104 103 98*  CO2 22 23 24   GLUCOSE 120* 100* 92  BUN 92* 88* 90*  CREATININE 13.85* 13.34* 12.77*  CALCIUM 7.3* 7.4* 8.1*    Liver Function Tests: No results for input(s): AST, ALT, ALKPHOS, BILITOT, PROT, ALBUMIN in the last 168 hours. No results for input(s): LIPASE, AMYLASE in the last 168 hours. No results for input(s): AMMONIA in the last 168 hours.  CBC:  Recent Labs Lab 07/16/16 0949 07/17/16 0742 07/20/16 0703  WBC 11.1* 16.5* 9.9  HGB 10.2* 7.2* 8.2*  HCT 30.0* 21.6*  24.6*  MCV 90.6 90.7 90.4  PLT 205 183 224    Cardiac Enzymes:  Recent Labs Lab 07/16/16 0949 07/16/16 1415 07/16/16 1827 07/16/16 2345 07/18/16 0847  TROPONINI 0.16* 0.15* 0.14* 0.21* 0.21*    BNP: Invalid input(s): POCBNP  CBG:  Recent Labs Lab 07/19/16 0717 07/19/16 1134 07/19/16 1641 07/19/16 2053 07/20/16 0744  GLUCAP 72 171* 164* 105* 26*    Microbiology: Results for orders placed or performed during the hospital encounter of 07/16/16  MRSA PCR Screening     Status: None   Collection Time: 07/16/16  2:09 PM  Result Value Ref Range Status   MRSA by PCR NEGATIVE NEGATIVE Final    Comment:        The GeneXpert MRSA Assay (FDA approved for NASAL specimens only), is one component of a comprehensive MRSA colonization surveillance program. It is not intended to diagnose MRSA infection nor to guide or monitor treatment for MRSA infections.     Coagulation Studies: No results for input(s): LABPROT, INR in the last 72 hours.  Urinalysis: No results for input(s): COLORURINE, LABSPEC, PHURINE, GLUCOSEU, HGBUR, BILIRUBINUR, KETONESUR, PROTEINUR, UROBILINOGEN, NITRITE, LEUKOCYTESUR in the last 72 hours.  Invalid input(s): APPERANCEUR    Imaging: No results found.   Medications:    . amLODipine  10 mg Oral Daily  . aspirin  81 mg Oral Daily  .  calcium acetate  2,001 mg Oral TID WC  . carvedilol  12.5 mg Oral BID WC  . dialysis solution 2.5% low-MG/low-CA   Intraperitoneal Q24H  . furosemide  80 mg Oral BID  . gentamicin cream  1 application Topical Daily  . gentamicin cream  1 application Topical Daily  . heparin subcutaneous  5,000 Units Subcutaneous Q8H  . insulin aspart  0-15 Units Subcutaneous TID WC  . insulin aspart  0-5 Units Subcutaneous QHS  . insulin aspart  3 Units Subcutaneous TID WC  . insulin glargine  20 Units Subcutaneous QHS  . levothyroxine  50 mcg Oral QAC breakfast  . metolazone  5 mg Oral Daily  . multivitamin  1 tablet  Oral QHS  . pravastatin  40 mg Oral q1800  . vitamin B-12  1,000 mcg Oral Daily   sodium chloride, acetaminophen, docusate sodium, heparin, heparin, hydrALAZINE, ipratropium-albuterol, metoprolol, nitroGLYCERIN, ondansetron (ZOFRAN) IV  Assessment/ Plan:  65 y.o. female with diabetes mellitus type 2, diabetic neuropathy, anemia, hypertension, hyperlipidemia, diabetic nephropathy, ESRD due to DM,  PD catheter placed 11/25/15, anemia of CKD, SHPTH, admission for respiratory failure 07/16/16  [facility]/PD CCPD 9 hours 4 exchanges 17108mL  1.  End-stage renal disease on peritoneal dialysis 6 nights a week. - Changed to 7 nights per week.  - Discontinued sodium bicarbonate - 4.25% dextrose last night - Concern for contamination: cell count and culture done. Cipro given. Will given cipro for seven days PO  2. Acute respiratory failure. Multifactorial with contributions from missed antihypertensives, high salt diet and inadequate dialysis.  - discussed low salt diet  3. Hypertension:  - started on carvedilol - Continue amlodipine, furosemide, metolazone  3. Anemia of chronic kidney disease. Hemoglobin 8.2 - epo as outpatient  4. Secondary hyperparathyroidism.   - calcium acetate   LOS: [place], [place] 12/28/201710:00 AM

## 2016-07-20 NOTE — Progress Notes (Signed)
PD disconnected. Pt. And husband stated lines disconnected last night. There were no signs or notes of this. Final UF 1970 ml. Pt. A&O x4. PD sample collected for cell count and sent to lab.

## 2016-07-20 NOTE — Progress Notes (Signed)
A&O. UP with one assist. Peritoneal dialysis has been infusing all night. Husband at side. No s/s distress.

## 2016-07-20 NOTE — Discharge Summary (Signed)
[place] [facility] [place] NAME: Bethany Lee    MR#:  [id number]  DATE OF BIRTH:  February 28, 1951  DATE OF ADMISSION:  07/16/2016 ADMITTING PHYSICIAN: Wilhelmina Mcardle, MD  DATE OF DISCHARGE: 07/20/16  PRIMARY CARE PHYSICIAN: Tracie Harrier, MD    ADMISSION DIAGNOSIS:  Precordial pain [R07.2] Acute pulmonary edema (HCC) [J81.0] NSTEMI (non-ST elevated myocardial infarction) ([place]) [I21.4] Acute respiratory failure with hypoxia ([place]) [J96.01]  DISCHARGE DIAGNOSIS:  Acute hypoxic respiratory failure due to Pulmonary edema/volume overload ESRD on PD HTN  SECONDARY DIAGNOSIS:   Past Medical History:  Diagnosis Date  . Anemia   . Chronic kidney disease    peritoneal dialysis  . Complication of anesthesia   . Diabetes mellitus without complication ([place])   . Dyspnea    doe  . Edema   . Hypercholesterolemia   . Hypertension   . Hypothyroidism   . Neuropathy ([place])   . PONV (postoperative nausea and vomiting)   . Shingles    3 weeks ago  . Tremors of nervous system    intermittent when taking gabapentin    HOSPITAL COURSE:  54 F with ESRD due to DM and hypertension, on peritoneal dialysis since 01/2015 admitted with CP and respiratory distress due to pulmonary edema  1,Acute hypoxic respiratory failure due to Pulmonary edema -Patient was transferred back to the ICU placed on BiPAP. Currently she is on RA sats 96%. -continue her Lasix 80 mg twice a day -Patient say she feels a lot better.  2. Chest pain - description c/w angina -no plans for cath cardiology   3. Sinus tachycardia improved  4. Mildly elevated trop likely demand ischemia in the setting of pulmonary edema no chest pain. - No acute EKG changes  5. Hypertension Continue metoprolol, hydralazine, Lasix, amlodipine, Coreg  6. ESRD, chronic PD -Followed by nephrology  7. DM 2 - chronic Lantus and SSI - poorly controlled  Overall improved D/c to  home D/ w pt and husband CONSULTS OBTAINED:  Treatment Team:  Corey Skains, MD  DRUG ALLERGIES:   Allergies  Allergen Reactions  . Penicillins     DISCHARGE MEDICATIONS:   Current Discharge Medication List    START taking these medications   Details  carvedilol (COREG) 12.5 MG tablet Take 1 tablet (12.5 mg total) by mouth 2 (two) times daily with a meal. Qty: 60 tablet, Refills: 0      CONTINUE these medications which have NOT CHANGED   Details  amLODipine (NORVASC) 5 MG tablet Take 5 mg by mouth daily.    aspirin EC 81 MG tablet Take 1 tablet (81 mg total) by mouth daily. Start taking after NOV 15 Qty: 30 tablet, Refills: 0    calcium acetate (PHOSLO) 667 MG capsule Take 2,001 mg by mouth 3 (three) times daily with meals.    docusate sodium (COLACE) 100 MG capsule Take 1 capsule (100 mg total) by mouth daily as needed. Qty: 30 capsule, Refills: 2    folic acid-vitamin b complex-vitamin c-selenium-zinc (DIALYVITE) 3 MG TABS tablet Take 1 tablet by mouth daily.    furosemide (LASIX) 80 MG tablet Take 80 mg by mouth 2 (two) times daily.    hydrALAZINE (APRESOLINE) 50 MG tablet Take 50 mg by mouth 2 (two) times daily.    insulin aspart (NOVOLOG FLEXPEN) 100 UNIT/ML FlexPen 2-8 Units 3 (three) times daily with meals.     Insulin Glargine (TOUJEO SOLOSTAR) 300 UNIT/ML SOPN Inject 20 Units into  the skin at bedtime. Qty: 30 pen, Refills: 5    levothyroxine (SYNTHROID, LEVOTHROID) 50 MCG tablet Take 50 mcg by mouth daily before breakfast.    lovastatin (MEVACOR) 40 MG tablet Take 40 mg by mouth at bedtime.    metolazone (ZAROXOLYN) 5 MG tablet Take 5 mg by mouth daily.     oxyCODONE-acetaminophen (ROXICET) 5-325 MG tablet Take 1 tablet by mouth every 6 (six) hours as needed for severe pain. Qty: 20 tablet, Refills: 0    Peritoneal Dialysis Solutions (DIALYSIS SOLUTION 1.5% LOW-MG/LOW-CA) dianeal solution Inject 1,000 mLs into the peritoneum daily. Qty: 1000 mL,  Refills: 5    sodium bicarbonate 650 MG tablet Take 1,300 mg by mouth 2 (two) times daily.    vitamin B-12 (CYANOCOBALAMIN) 1000 MCG tablet Take 1,000 mcg by mouth daily.    gabapentin (NEURONTIN) 300 MG capsule Take 300 mg by mouth 3 (three) times daily. Reported on 01/06/2016    gentamicin cream (GARAMYCIN) 0.1 % Apply 1 application topically daily. Qty: 15 g, Refills: 0        If you experience worsening of your admission symptoms, develop shortness of breath, life threatening emergency, suicidal or homicidal thoughts you must seek medical attention immediately by calling 911 or calling your MD immediately  if symptoms less severe.  You Must read complete instructions/literature along with all the possible adverse reactions/side effects for all the Medicines you take and that have been prescribed to you. Take any new Medicines after you have completely understood and accept all the possible adverse reactions/side effects.   Please note  You were cared for by a hospitalist during your hospital stay. If you have any questions about your discharge medications or the care you received while you were in the hospital after you are discharged, you can call the unit and asked to speak with the hospitalist on call if the hospitalist that took care of you is not available. Once you are discharged, your primary care physician will handle any further medical issues. Please note that NO REFILLS for any discharge medications will be authorized once you are discharged, as it is imperative that you return to your primary care physician (or establish a relationship with a primary care physician if you do not have one) for your aftercare needs so that they can reassess your need for medications and monitor your lab values. Today   SUBJECTIVE   Doing well. No new complaints  VITAL SIGNS:  Blood pressure (!) 156/60, pulse 79, temperature 97.6 F (36.4 C), resp. rate 16, height 5' (1.524 m), weight 68.6 kg  (151 lb 3.8 oz), SpO2 92 %.  I/O:   Intake/Output Summary (Last 24 hours) at 07/20/16 0923 Last data filed at 07/20/16 0815  Gross per 24 hour  Intake              340 ml  Output             2345 ml  Net            -2005 ml    PHYSICAL EXAMINATION:  GENERAL:  65 y.o.-year-old patient lying in the bed with no acute distress.  EYES: Pupils equal, round, reactive to light and accommodation. No scleral icterus. Extraocular muscles intact.  HEENT: Head atraumatic, normocephalic. Oropharynx and nasopharynx clear.  NECK:  Supple, no jugular venous distention. No thyroid enlargement, no tenderness.  LUNGS: Normal breath sounds bilaterally, no wheezing, rales,rhonchi or crepitation. No use of accessory muscles of respiration.  CARDIOVASCULAR: S1,  S2 normal. No murmurs, rubs, or gallops.  ABDOMEN: Soft, non-tender, non-distended. Bowel sounds present. No organomegaly or mass. PD cath+ EXTREMITIES: No pedal edema, cyanosis, or clubbing.  NEUROLOGIC: Cranial nerves II through XII are intact. Muscle strength 5/5 in all extremities. Sensation intact. Gait not checked.  PSYCHIATRIC: The patient is alert and oriented x 3.  SKIN: No obvious rash, lesion, or ulcer.   DATA REVIEW:   CBC   Recent Labs Lab 07/20/16 0703  WBC 9.9  HGB 8.2*  HCT 24.6*  PLT 224    Chemistries   Recent Labs Lab 07/18/16 0847  NA 135  K 4.7  CL 98*  CO2 24  GLUCOSE 92  BUN 90*  CREATININE 12.77*  CALCIUM 8.1*    Microbiology Results   Recent Results (from the past 240 hour(s))  MRSA PCR Screening     Status: None   Collection Time: 07/16/16  2:09 PM  Result Value Ref Range Status   MRSA by PCR NEGATIVE NEGATIVE Final    Comment:        The GeneXpert MRSA Assay (FDA approved for NASAL specimens only), is one component of a comprehensive MRSA colonization surveillance program. It is not intended to diagnose MRSA infection nor to guide or monitor treatment for MRSA infections.      RADIOLOGY:  No results found.   Management plans discussed with the patient, family and they are in agreement.  CODE STATUS:     Code Status Orders        Start     Ordered   07/16/16 1209  Full code  Continuous     07/16/16 1215    Code Status History    Date Active Date Inactive Code Status Order ID Comments User Context   12/19/2015  2:16 PM 12/21/2015  8:57 PM Full Code [id number]  Fritzi Mandes, MD Inpatient      TOTAL TIME TAKING CARE OF THIS PATIENT: 40 minutes.    Damaria Stofko M.D on 07/20/2016 at 9:23 AM  Between 7am to 6pm - Pager - [phone] After 6pm go to [email or web address] - password [email or web address]  Office  [phone]  CC: Primary care physician; Tracie Harrier, MD

## 2016-07-20 NOTE — Care Management (Signed)
This patient is chronic peritoneal dialysis patient and it does not appear that [facility] notified.  Sent medical record information to Claudius Sis with [facility].  This CM did not get to speak with patient as she had already discharged from the unit by the time was informed of chronic dialysis

## 2016-07-21 DIAGNOSIS — Z23 Encounter for immunization: Secondary | ICD-10-CM | POA: Diagnosis not present

## 2016-07-21 DIAGNOSIS — N186 End stage renal disease: Secondary | ICD-10-CM | POA: Diagnosis not present

## 2016-07-21 DIAGNOSIS — Z992 Dependence on renal dialysis: Secondary | ICD-10-CM | POA: Diagnosis not present

## 2016-07-23 DIAGNOSIS — Z23 Encounter for immunization: Secondary | ICD-10-CM | POA: Diagnosis not present

## 2016-07-23 DIAGNOSIS — Z992 Dependence on renal dialysis: Secondary | ICD-10-CM | POA: Diagnosis not present

## 2016-07-23 DIAGNOSIS — N186 End stage renal disease: Secondary | ICD-10-CM | POA: Diagnosis not present

## 2016-07-23 LAB — BODY FLUID CULTURE
Culture: NO GROWTH
Gram Stain: NONE SEEN
Special Requests: NORMAL

## 2016-07-24 DIAGNOSIS — N186 End stage renal disease: Secondary | ICD-10-CM | POA: Diagnosis not present

## 2016-07-24 DIAGNOSIS — Z992 Dependence on renal dialysis: Secondary | ICD-10-CM | POA: Diagnosis not present

## 2016-07-25 DIAGNOSIS — Z992 Dependence on renal dialysis: Secondary | ICD-10-CM | POA: Diagnosis not present

## 2016-07-25 DIAGNOSIS — N186 End stage renal disease: Secondary | ICD-10-CM | POA: Diagnosis not present

## 2016-07-26 DIAGNOSIS — N186 End stage renal disease: Secondary | ICD-10-CM | POA: Diagnosis not present

## 2016-07-26 DIAGNOSIS — Z992 Dependence on renal dialysis: Secondary | ICD-10-CM | POA: Diagnosis not present

## 2016-07-27 DIAGNOSIS — Z992 Dependence on renal dialysis: Secondary | ICD-10-CM | POA: Diagnosis not present

## 2016-07-27 DIAGNOSIS — N186 End stage renal disease: Secondary | ICD-10-CM | POA: Diagnosis not present

## 2016-07-28 DIAGNOSIS — Z992 Dependence on renal dialysis: Secondary | ICD-10-CM | POA: Diagnosis not present

## 2016-07-28 DIAGNOSIS — N186 End stage renal disease: Secondary | ICD-10-CM | POA: Diagnosis not present

## 2016-07-30 DIAGNOSIS — Z992 Dependence on renal dialysis: Secondary | ICD-10-CM | POA: Diagnosis not present

## 2016-07-30 DIAGNOSIS — N186 End stage renal disease: Secondary | ICD-10-CM | POA: Diagnosis not present

## 2016-07-31 DIAGNOSIS — Z992 Dependence on renal dialysis: Secondary | ICD-10-CM | POA: Diagnosis not present

## 2016-07-31 DIAGNOSIS — N186 End stage renal disease: Secondary | ICD-10-CM | POA: Diagnosis not present

## 2016-08-01 DIAGNOSIS — E1122 Type 2 diabetes mellitus with diabetic chronic kidney disease: Secondary | ICD-10-CM | POA: Diagnosis not present

## 2016-08-01 DIAGNOSIS — I5022 Chronic systolic (congestive) heart failure: Secondary | ICD-10-CM | POA: Diagnosis not present

## 2016-08-01 DIAGNOSIS — E1121 Type 2 diabetes mellitus with diabetic nephropathy: Secondary | ICD-10-CM | POA: Diagnosis not present

## 2016-08-01 DIAGNOSIS — Z992 Dependence on renal dialysis: Secondary | ICD-10-CM | POA: Diagnosis not present

## 2016-08-01 DIAGNOSIS — I10 Essential (primary) hypertension: Secondary | ICD-10-CM | POA: Diagnosis not present

## 2016-08-01 DIAGNOSIS — Z09 Encounter for follow-up examination after completed treatment for conditions other than malignant neoplasm: Secondary | ICD-10-CM | POA: Diagnosis not present

## 2016-08-01 DIAGNOSIS — N186 End stage renal disease: Secondary | ICD-10-CM | POA: Diagnosis not present

## 2016-08-01 DIAGNOSIS — Z794 Long term (current) use of insulin: Secondary | ICD-10-CM | POA: Diagnosis not present

## 2016-08-01 DIAGNOSIS — N185 Chronic kidney disease, stage 5: Secondary | ICD-10-CM | POA: Diagnosis not present

## 2016-08-02 DIAGNOSIS — N186 End stage renal disease: Secondary | ICD-10-CM | POA: Diagnosis not present

## 2016-08-02 DIAGNOSIS — Z992 Dependence on renal dialysis: Secondary | ICD-10-CM | POA: Diagnosis not present

## 2016-08-02 DIAGNOSIS — E113412 Type 2 diabetes mellitus with severe nonproliferative diabetic retinopathy with macular edema, left eye: Secondary | ICD-10-CM | POA: Diagnosis not present

## 2016-08-02 DIAGNOSIS — E113511 Type 2 diabetes mellitus with proliferative diabetic retinopathy with macular edema, right eye: Secondary | ICD-10-CM | POA: Diagnosis not present

## 2016-08-03 DIAGNOSIS — Z794 Long term (current) use of insulin: Secondary | ICD-10-CM | POA: Diagnosis not present

## 2016-08-03 DIAGNOSIS — Z992 Dependence on renal dialysis: Secondary | ICD-10-CM | POA: Diagnosis not present

## 2016-08-03 DIAGNOSIS — N186 End stage renal disease: Secondary | ICD-10-CM | POA: Diagnosis not present

## 2016-08-03 DIAGNOSIS — E119 Type 2 diabetes mellitus without complications: Secondary | ICD-10-CM | POA: Diagnosis not present

## 2016-08-04 DIAGNOSIS — Z992 Dependence on renal dialysis: Secondary | ICD-10-CM | POA: Diagnosis not present

## 2016-08-04 DIAGNOSIS — N186 End stage renal disease: Secondary | ICD-10-CM | POA: Diagnosis not present

## 2016-08-06 DIAGNOSIS — Z992 Dependence on renal dialysis: Secondary | ICD-10-CM | POA: Diagnosis not present

## 2016-08-06 DIAGNOSIS — N186 End stage renal disease: Secondary | ICD-10-CM | POA: Diagnosis not present

## 2016-08-07 DIAGNOSIS — N186 End stage renal disease: Secondary | ICD-10-CM | POA: Diagnosis not present

## 2016-08-07 DIAGNOSIS — Z992 Dependence on renal dialysis: Secondary | ICD-10-CM | POA: Diagnosis not present

## 2016-08-08 DIAGNOSIS — N186 End stage renal disease: Secondary | ICD-10-CM | POA: Diagnosis not present

## 2016-08-08 DIAGNOSIS — Z992 Dependence on renal dialysis: Secondary | ICD-10-CM | POA: Diagnosis not present

## 2016-08-09 DIAGNOSIS — N186 End stage renal disease: Secondary | ICD-10-CM | POA: Diagnosis not present

## 2016-08-09 DIAGNOSIS — Z992 Dependence on renal dialysis: Secondary | ICD-10-CM | POA: Diagnosis not present

## 2016-08-10 DIAGNOSIS — Z992 Dependence on renal dialysis: Secondary | ICD-10-CM | POA: Diagnosis not present

## 2016-08-10 DIAGNOSIS — N186 End stage renal disease: Secondary | ICD-10-CM | POA: Diagnosis not present

## 2016-08-11 DIAGNOSIS — N186 End stage renal disease: Secondary | ICD-10-CM | POA: Diagnosis not present

## 2016-08-11 DIAGNOSIS — Z992 Dependence on renal dialysis: Secondary | ICD-10-CM | POA: Diagnosis not present

## 2016-08-13 DIAGNOSIS — N186 End stage renal disease: Secondary | ICD-10-CM | POA: Diagnosis not present

## 2016-08-13 DIAGNOSIS — Z992 Dependence on renal dialysis: Secondary | ICD-10-CM | POA: Diagnosis not present

## 2016-08-14 DIAGNOSIS — Z992 Dependence on renal dialysis: Secondary | ICD-10-CM | POA: Diagnosis not present

## 2016-08-14 DIAGNOSIS — N186 End stage renal disease: Secondary | ICD-10-CM | POA: Diagnosis not present

## 2016-08-15 DIAGNOSIS — N186 End stage renal disease: Secondary | ICD-10-CM | POA: Diagnosis not present

## 2016-08-15 DIAGNOSIS — Z992 Dependence on renal dialysis: Secondary | ICD-10-CM | POA: Diagnosis not present

## 2016-08-16 DIAGNOSIS — Z992 Dependence on renal dialysis: Secondary | ICD-10-CM | POA: Diagnosis not present

## 2016-08-16 DIAGNOSIS — N186 End stage renal disease: Secondary | ICD-10-CM | POA: Diagnosis not present

## 2016-08-17 DIAGNOSIS — N186 End stage renal disease: Secondary | ICD-10-CM | POA: Diagnosis not present

## 2016-08-17 DIAGNOSIS — E782 Mixed hyperlipidemia: Secondary | ICD-10-CM | POA: Diagnosis not present

## 2016-08-17 DIAGNOSIS — I10 Essential (primary) hypertension: Secondary | ICD-10-CM | POA: Diagnosis not present

## 2016-08-17 DIAGNOSIS — I5041 Acute combined systolic (congestive) and diastolic (congestive) heart failure: Secondary | ICD-10-CM | POA: Insufficient documentation

## 2016-08-17 DIAGNOSIS — E1142 Type 2 diabetes mellitus with diabetic polyneuropathy: Secondary | ICD-10-CM | POA: Diagnosis not present

## 2016-08-17 DIAGNOSIS — N185 Chronic kidney disease, stage 5: Secondary | ICD-10-CM | POA: Diagnosis not present

## 2016-08-17 DIAGNOSIS — Z992 Dependence on renal dialysis: Secondary | ICD-10-CM | POA: Diagnosis not present

## 2016-08-18 DIAGNOSIS — Z992 Dependence on renal dialysis: Secondary | ICD-10-CM | POA: Diagnosis not present

## 2016-08-18 DIAGNOSIS — N186 End stage renal disease: Secondary | ICD-10-CM | POA: Diagnosis not present

## 2016-08-20 DIAGNOSIS — N186 End stage renal disease: Secondary | ICD-10-CM | POA: Diagnosis not present

## 2016-08-20 DIAGNOSIS — Z992 Dependence on renal dialysis: Secondary | ICD-10-CM | POA: Diagnosis not present

## 2016-08-21 DIAGNOSIS — E1122 Type 2 diabetes mellitus with diabetic chronic kidney disease: Secondary | ICD-10-CM | POA: Diagnosis not present

## 2016-08-21 DIAGNOSIS — Z992 Dependence on renal dialysis: Secondary | ICD-10-CM | POA: Diagnosis not present

## 2016-08-21 DIAGNOSIS — B029 Zoster without complications: Secondary | ICD-10-CM | POA: Diagnosis not present

## 2016-08-21 DIAGNOSIS — I10 Essential (primary) hypertension: Secondary | ICD-10-CM | POA: Diagnosis not present

## 2016-08-21 DIAGNOSIS — N186 End stage renal disease: Secondary | ICD-10-CM | POA: Diagnosis not present

## 2016-08-21 DIAGNOSIS — G8929 Other chronic pain: Secondary | ICD-10-CM | POA: Diagnosis not present

## 2016-08-21 DIAGNOSIS — E782 Mixed hyperlipidemia: Secondary | ICD-10-CM | POA: Diagnosis not present

## 2016-08-21 DIAGNOSIS — M545 Low back pain: Secondary | ICD-10-CM | POA: Diagnosis not present

## 2016-08-21 DIAGNOSIS — E039 Hypothyroidism, unspecified: Secondary | ICD-10-CM | POA: Diagnosis not present

## 2016-08-21 DIAGNOSIS — Z794 Long term (current) use of insulin: Secondary | ICD-10-CM | POA: Diagnosis not present

## 2016-08-22 DIAGNOSIS — N186 End stage renal disease: Secondary | ICD-10-CM | POA: Diagnosis not present

## 2016-08-22 DIAGNOSIS — Z992 Dependence on renal dialysis: Secondary | ICD-10-CM | POA: Diagnosis not present

## 2016-08-23 DIAGNOSIS — Z992 Dependence on renal dialysis: Secondary | ICD-10-CM | POA: Diagnosis not present

## 2016-08-23 DIAGNOSIS — N186 End stage renal disease: Secondary | ICD-10-CM | POA: Diagnosis not present

## 2016-08-24 DIAGNOSIS — J029 Acute pharyngitis, unspecified: Secondary | ICD-10-CM | POA: Diagnosis not present

## 2016-08-24 DIAGNOSIS — N186 End stage renal disease: Secondary | ICD-10-CM | POA: Diagnosis not present

## 2016-08-24 DIAGNOSIS — N185 Chronic kidney disease, stage 5: Secondary | ICD-10-CM | POA: Diagnosis not present

## 2016-08-24 DIAGNOSIS — Z794 Long term (current) use of insulin: Secondary | ICD-10-CM | POA: Diagnosis not present

## 2016-08-24 DIAGNOSIS — E1165 Type 2 diabetes mellitus with hyperglycemia: Secondary | ICD-10-CM | POA: Diagnosis not present

## 2016-08-24 DIAGNOSIS — Z992 Dependence on renal dialysis: Secondary | ICD-10-CM | POA: Diagnosis not present

## 2016-08-25 DIAGNOSIS — N186 End stage renal disease: Secondary | ICD-10-CM | POA: Diagnosis not present

## 2016-08-25 DIAGNOSIS — I5041 Acute combined systolic (congestive) and diastolic (congestive) heart failure: Secondary | ICD-10-CM | POA: Diagnosis not present

## 2016-08-25 DIAGNOSIS — Z992 Dependence on renal dialysis: Secondary | ICD-10-CM | POA: Diagnosis not present

## 2016-08-27 DIAGNOSIS — N186 End stage renal disease: Secondary | ICD-10-CM | POA: Diagnosis not present

## 2016-08-27 DIAGNOSIS — Z992 Dependence on renal dialysis: Secondary | ICD-10-CM | POA: Diagnosis not present

## 2016-08-28 DIAGNOSIS — E113412 Type 2 diabetes mellitus with severe nonproliferative diabetic retinopathy with macular edema, left eye: Secondary | ICD-10-CM | POA: Diagnosis not present

## 2016-08-28 DIAGNOSIS — N186 End stage renal disease: Secondary | ICD-10-CM | POA: Diagnosis not present

## 2016-08-28 DIAGNOSIS — Z794 Long term (current) use of insulin: Secondary | ICD-10-CM | POA: Diagnosis not present

## 2016-08-28 DIAGNOSIS — Z992 Dependence on renal dialysis: Secondary | ICD-10-CM | POA: Diagnosis not present

## 2016-08-28 DIAGNOSIS — E1122 Type 2 diabetes mellitus with diabetic chronic kidney disease: Secondary | ICD-10-CM | POA: Diagnosis not present

## 2016-08-29 DIAGNOSIS — N186 End stage renal disease: Secondary | ICD-10-CM | POA: Diagnosis not present

## 2016-08-29 DIAGNOSIS — Z992 Dependence on renal dialysis: Secondary | ICD-10-CM | POA: Diagnosis not present

## 2016-08-30 DIAGNOSIS — I5022 Chronic systolic (congestive) heart failure: Secondary | ICD-10-CM | POA: Diagnosis not present

## 2016-08-30 DIAGNOSIS — G63 Polyneuropathy in diseases classified elsewhere: Secondary | ICD-10-CM | POA: Diagnosis not present

## 2016-08-30 DIAGNOSIS — N185 Chronic kidney disease, stage 5: Secondary | ICD-10-CM | POA: Diagnosis not present

## 2016-08-30 DIAGNOSIS — E1142 Type 2 diabetes mellitus with diabetic polyneuropathy: Secondary | ICD-10-CM | POA: Diagnosis not present

## 2016-08-30 DIAGNOSIS — Z0001 Encounter for general adult medical examination with abnormal findings: Secondary | ICD-10-CM | POA: Diagnosis not present

## 2016-08-30 DIAGNOSIS — I10 Essential (primary) hypertension: Secondary | ICD-10-CM | POA: Diagnosis not present

## 2016-08-30 DIAGNOSIS — D638 Anemia in other chronic diseases classified elsewhere: Secondary | ICD-10-CM | POA: Diagnosis not present

## 2016-08-30 DIAGNOSIS — I5041 Acute combined systolic (congestive) and diastolic (congestive) heart failure: Secondary | ICD-10-CM | POA: Diagnosis not present

## 2016-08-30 DIAGNOSIS — E1122 Type 2 diabetes mellitus with diabetic chronic kidney disease: Secondary | ICD-10-CM | POA: Diagnosis not present

## 2016-08-30 DIAGNOSIS — Z01810 Encounter for preprocedural cardiovascular examination: Secondary | ICD-10-CM | POA: Diagnosis not present

## 2016-08-30 DIAGNOSIS — E782 Mixed hyperlipidemia: Secondary | ICD-10-CM | POA: Diagnosis not present

## 2016-08-30 DIAGNOSIS — N186 End stage renal disease: Secondary | ICD-10-CM | POA: Diagnosis not present

## 2016-08-30 DIAGNOSIS — Z0000 Encounter for general adult medical examination without abnormal findings: Secondary | ICD-10-CM | POA: Diagnosis not present

## 2016-08-30 DIAGNOSIS — Z992 Dependence on renal dialysis: Secondary | ICD-10-CM | POA: Diagnosis not present

## 2016-08-31 DIAGNOSIS — Z992 Dependence on renal dialysis: Secondary | ICD-10-CM | POA: Diagnosis not present

## 2016-08-31 DIAGNOSIS — N186 End stage renal disease: Secondary | ICD-10-CM | POA: Diagnosis not present

## 2016-09-01 DIAGNOSIS — N186 End stage renal disease: Secondary | ICD-10-CM | POA: Diagnosis not present

## 2016-09-01 DIAGNOSIS — Z992 Dependence on renal dialysis: Secondary | ICD-10-CM | POA: Diagnosis not present

## 2016-09-03 DIAGNOSIS — N186 End stage renal disease: Secondary | ICD-10-CM | POA: Diagnosis not present

## 2016-09-03 DIAGNOSIS — Z992 Dependence on renal dialysis: Secondary | ICD-10-CM | POA: Diagnosis not present

## 2016-09-04 DIAGNOSIS — Z992 Dependence on renal dialysis: Secondary | ICD-10-CM | POA: Diagnosis not present

## 2016-09-04 DIAGNOSIS — N186 End stage renal disease: Secondary | ICD-10-CM | POA: Diagnosis not present

## 2016-09-05 DIAGNOSIS — N186 End stage renal disease: Secondary | ICD-10-CM | POA: Diagnosis not present

## 2016-09-05 DIAGNOSIS — Z992 Dependence on renal dialysis: Secondary | ICD-10-CM | POA: Diagnosis not present

## 2016-09-06 DIAGNOSIS — E113512 Type 2 diabetes mellitus with proliferative diabetic retinopathy with macular edema, left eye: Secondary | ICD-10-CM | POA: Diagnosis not present

## 2016-09-06 DIAGNOSIS — Z992 Dependence on renal dialysis: Secondary | ICD-10-CM | POA: Diagnosis not present

## 2016-09-06 DIAGNOSIS — N186 End stage renal disease: Secondary | ICD-10-CM | POA: Diagnosis not present

## 2016-09-06 DIAGNOSIS — E113511 Type 2 diabetes mellitus with proliferative diabetic retinopathy with macular edema, right eye: Secondary | ICD-10-CM | POA: Diagnosis not present

## 2016-09-07 ENCOUNTER — Encounter: Payer: Self-pay | Admitting: *Deleted

## 2016-09-07 ENCOUNTER — Encounter
Admission: RE | Disposition: A | Discharge status: Home / Self Care | Payer: Self-pay | Source: Ambulatory Visit | Attending: Cardiology

## 2016-09-07 ENCOUNTER — Observation Stay
Admission: RE | Admit: 2016-09-07 | Discharge: 2016-09-08 | Disposition: A | Discharge status: Home / Self Care | Payer: Medicare HMO | Source: Ambulatory Visit | Attending: Cardiology | Admitting: Cardiology

## 2016-09-07 DIAGNOSIS — Z833 Family history of diabetes mellitus: Secondary | ICD-10-CM | POA: Insufficient documentation

## 2016-09-07 DIAGNOSIS — Z820 Family history of epilepsy and other diseases of the nervous system: Secondary | ICD-10-CM | POA: Diagnosis not present

## 2016-09-07 DIAGNOSIS — E785 Hyperlipidemia, unspecified: Secondary | ICD-10-CM | POA: Insufficient documentation

## 2016-09-07 DIAGNOSIS — E11319 Type 2 diabetes mellitus with unspecified diabetic retinopathy without macular edema: Secondary | ICD-10-CM | POA: Insufficient documentation

## 2016-09-07 DIAGNOSIS — I25119 Atherosclerotic heart disease of native coronary artery with unspecified angina pectoris: Secondary | ICD-10-CM | POA: Diagnosis not present

## 2016-09-07 DIAGNOSIS — Z86010 Personal history of colonic polyps: Secondary | ICD-10-CM | POA: Insufficient documentation

## 2016-09-07 DIAGNOSIS — R079 Chest pain, unspecified: Secondary | ICD-10-CM | POA: Diagnosis present

## 2016-09-07 DIAGNOSIS — Z794 Long term (current) use of insulin: Secondary | ICD-10-CM | POA: Diagnosis not present

## 2016-09-07 DIAGNOSIS — N186 End stage renal disease: Secondary | ICD-10-CM | POA: Insufficient documentation

## 2016-09-07 DIAGNOSIS — Z7982 Long term (current) use of aspirin: Secondary | ICD-10-CM | POA: Insufficient documentation

## 2016-09-07 DIAGNOSIS — Z992 Dependence on renal dialysis: Secondary | ICD-10-CM | POA: Insufficient documentation

## 2016-09-07 DIAGNOSIS — Z87891 Personal history of nicotine dependence: Secondary | ICD-10-CM | POA: Insufficient documentation

## 2016-09-07 DIAGNOSIS — I2510 Atherosclerotic heart disease of native coronary artery without angina pectoris: Principal | ICD-10-CM | POA: Insufficient documentation

## 2016-09-07 DIAGNOSIS — Z880 Allergy status to penicillin: Secondary | ICD-10-CM | POA: Diagnosis not present

## 2016-09-07 DIAGNOSIS — E039 Hypothyroidism, unspecified: Secondary | ICD-10-CM | POA: Diagnosis not present

## 2016-09-07 DIAGNOSIS — Z888 Allergy status to other drugs, medicaments and biological substances status: Secondary | ICD-10-CM | POA: Diagnosis not present

## 2016-09-07 DIAGNOSIS — E1122 Type 2 diabetes mellitus with diabetic chronic kidney disease: Secondary | ICD-10-CM | POA: Insufficient documentation

## 2016-09-07 DIAGNOSIS — I5022 Chronic systolic (congestive) heart failure: Secondary | ICD-10-CM | POA: Insufficient documentation

## 2016-09-07 DIAGNOSIS — Z90710 Acquired absence of both cervix and uterus: Secondary | ICD-10-CM | POA: Insufficient documentation

## 2016-09-07 DIAGNOSIS — I132 Hypertensive heart and chronic kidney disease with heart failure and with stage 5 chronic kidney disease, or end stage renal disease: Secondary | ICD-10-CM | POA: Diagnosis not present

## 2016-09-07 DIAGNOSIS — Z79899 Other long term (current) drug therapy: Secondary | ICD-10-CM | POA: Diagnosis not present

## 2016-09-07 HISTORY — PX: CORONARY STENT INTERVENTION: CATH118234

## 2016-09-07 HISTORY — PX: LEFT HEART CATH AND CORONARY ANGIOGRAPHY: CATH118249

## 2016-09-07 LAB — POCT ACTIVATED CLOTTING TIME: Activated Clotting Time: 307 seconds

## 2016-09-07 LAB — POTASSIUM: POTASSIUM: 4.9 mmol/L (ref 3.5–5.1)

## 2016-09-07 LAB — GLUCOSE, CAPILLARY
GLUCOSE-CAPILLARY: 160 mg/dL — AB (ref 65–99)
GLUCOSE-CAPILLARY: 154 mg/dL — AB (ref 65–99)
GLUCOSE-CAPILLARY: 88 mg/dL (ref 65–99)
Glucose-Capillary: 57 mg/dL — ABNORMAL LOW (ref 65–99)

## 2016-09-07 SURGERY — LEFT HEART CATH AND CORONARY ANGIOGRAPHY
Anesthesia: Moderate Sedation

## 2016-09-07 MED ORDER — MIDAZOLAM HCL 2 MG/2ML IJ SOLN
INTRAMUSCULAR | Status: AC
  Filled 2016-09-07: qty 2

## 2016-09-07 MED ORDER — INSULIN ASPART 100 UNIT/ML ~~LOC~~ SOLN
4.0000 [IU] | Freq: Three times a day (TID) | SUBCUTANEOUS | Status: DC
  Administered 2016-09-07: 4 [IU] via SUBCUTANEOUS
  Filled 2016-09-07: qty 4

## 2016-09-07 MED ORDER — SODIUM CHLORIDE 0.9% FLUSH: 3.0000 mL | INTRAVENOUS | Status: DC | PRN

## 2016-09-07 MED ORDER — ASPIRIN 81 MG PO CHEW
CHEWABLE_TABLET | ORAL | Status: DC | PRN
  Administered 2016-09-07: 324 mg via ORAL

## 2016-09-07 MED ORDER — SODIUM BICARBONATE 650 MG PO TABS
1300.0000 mg | ORAL_TABLET | Freq: Two times a day (BID) | ORAL | Status: DC
  Administered 2016-09-07 – 2016-09-08 (×2): 1300 mg via ORAL
  Filled 2016-09-07 (×2): qty 2

## 2016-09-07 MED ORDER — SODIUM CHLORIDE 0.9 % IV SOLN
INTRAVENOUS | Status: DC | PRN
  Administered 2016-09-07: 0.25 mg/kg/h via INTRAVENOUS

## 2016-09-07 MED ORDER — NITROGLYCERIN 1 MG/10 ML FOR IR/CATH LAB
INTRA_ARTERIAL | Status: DC | PRN
  Administered 2016-09-07: 200 ug via INTRACORONARY

## 2016-09-07 MED ORDER — SODIUM CHLORIDE 0.9% FLUSH: 3.0000 mL | Freq: Two times a day (BID) | INTRAVENOUS | Status: DC

## 2016-09-07 MED ORDER — INSULIN GLARGINE 100 UNIT/ML ~~LOC~~ SOLN
24.0000 [IU] | Freq: Every day | SUBCUTANEOUS | Status: DC
  Filled 2016-09-07 (×2): qty 0.24

## 2016-09-07 MED ORDER — HYDRALAZINE HCL 20 MG/ML IJ SOLN: 5.0000 mg | INTRAMUSCULAR | Status: AC | PRN

## 2016-09-07 MED ORDER — SODIUM CHLORIDE 0.9 % IV SOLN
INTRAVENOUS | Status: DC
  Administered 2016-09-07: 08:00:00 via INTRAVENOUS

## 2016-09-07 MED ORDER — SODIUM CHLORIDE 0.9% FLUSH
3.0000 mL | Freq: Two times a day (BID) | INTRAVENOUS | Status: DC
  Administered 2016-09-07 – 2016-09-08 (×2): 3 mL via INTRAVENOUS

## 2016-09-07 MED ORDER — ACETAMINOPHEN 325 MG PO TABS: 650.0000 mg | ORAL_TABLET | ORAL | Status: DC | PRN

## 2016-09-07 MED ORDER — LISINOPRIL 10 MG PO TABS
10.0000 mg | ORAL_TABLET | Freq: Every day | ORAL | Status: DC
  Administered 2016-09-07 – 2016-09-08 (×2): 10 mg via ORAL
  Filled 2016-09-07 (×2): qty 1

## 2016-09-07 MED ORDER — CARVEDILOL 12.5 MG PO TABS
12.5000 mg | ORAL_TABLET | Freq: Two times a day (BID) | ORAL | Status: DC
  Administered 2016-09-07 – 2016-09-08 (×2): 12.5 mg via ORAL
  Filled 2016-09-07 (×2): qty 1

## 2016-09-07 MED ORDER — BIVALIRUDIN BOLUS VIA INFUSION - CUPID
INTRAVENOUS | Status: DC | PRN
  Administered 2016-09-07: 50.325 mg via INTRAVENOUS

## 2016-09-07 MED ORDER — FUROSEMIDE 40 MG PO TABS
80.0000 mg | ORAL_TABLET | Freq: Every day | ORAL | Status: DC
  Administered 2016-09-07 – 2016-09-08 (×2): 80 mg via ORAL
  Filled 2016-09-07 (×2): qty 2
  Filled 2016-09-07: qty 1

## 2016-09-07 MED ORDER — SEVELAMER CARBONATE 2.4 G PO PACK
2.4000 g | PACK | Freq: Three times a day (TID) | ORAL | Status: DC
  Administered 2016-09-07: 2.4 g via ORAL
  Filled 2016-09-07 (×2): qty 1

## 2016-09-07 MED ORDER — DEXTROSE 50 % IV SOLN
INTRAVENOUS | Status: AC
  Administered 2016-09-07: 25 mL
  Filled 2016-09-07: qty 50

## 2016-09-07 MED ORDER — HYDRALAZINE HCL 50 MG PO TABS
50.0000 mg | ORAL_TABLET | Freq: Two times a day (BID) | ORAL | Status: DC
  Administered 2016-09-07 – 2016-09-08 (×3): 50 mg via ORAL
  Filled 2016-09-07 (×3): qty 1

## 2016-09-07 MED ORDER — LEVOTHYROXINE SODIUM 75 MCG PO TABS
75.0000 ug | ORAL_TABLET | Freq: Every day | ORAL | Status: DC
  Administered 2016-09-08: 75 ug via ORAL
  Filled 2016-09-07: qty 1

## 2016-09-07 MED ORDER — FENTANYL CITRATE (PF) 100 MCG/2ML IJ SOLN
INTRAMUSCULAR | Status: AC
  Filled 2016-09-07: qty 2

## 2016-09-07 MED ORDER — BIVALIRUDIN 250 MG IV SOLR
INTRAVENOUS | Status: AC
  Filled 2016-09-07: qty 250

## 2016-09-07 MED ORDER — SODIUM CHLORIDE 0.9 % WEIGHT BASED INFUSION: 1.0000 mL/kg/h | INTRAVENOUS | Status: AC

## 2016-09-07 MED ORDER — SODIUM CHLORIDE 0.9 % IV SOLN: 250.0000 mL | INTRAVENOUS | Status: DC | PRN

## 2016-09-07 MED ORDER — ASPIRIN 81 MG PO CHEW
81.0000 mg | CHEWABLE_TABLET | Freq: Every day | ORAL | Status: DC
  Administered 2016-09-08: 81 mg via ORAL
  Filled 2016-09-07 (×2): qty 1

## 2016-09-07 MED ORDER — SODIUM CHLORIDE 0.9 % WEIGHT BASED INFUSION: 3.0000 mL/kg/h | INTRAVENOUS | Status: DC

## 2016-09-07 MED ORDER — MIDAZOLAM HCL 2 MG/2ML IJ SOLN
INTRAMUSCULAR | Status: DC | PRN
  Administered 2016-09-07: 1 mg via INTRAVENOUS

## 2016-09-07 MED ORDER — ASPIRIN 81 MG PO CHEW: 81.0000 mg | CHEWABLE_TABLET | ORAL | Status: DC

## 2016-09-07 MED ORDER — CLOPIDOGREL BISULFATE 75 MG PO TABS
75.0000 mg | ORAL_TABLET | Freq: Every day | ORAL | Status: DC
  Administered 2016-09-08: 75 mg via ORAL
  Filled 2016-09-07: qty 1

## 2016-09-07 MED ORDER — NITROGLYCERIN 5 MG/ML IV SOLN
INTRAVENOUS | Status: AC
  Filled 2016-09-07: qty 10

## 2016-09-07 MED ORDER — ONDANSETRON HCL 4 MG/2ML IJ SOLN: 4.0000 mg | Freq: Four times a day (QID) | INTRAMUSCULAR | Status: DC | PRN

## 2016-09-07 MED ORDER — IOPAMIDOL (ISOVUE-300) INJECTION 61%
INTRAVENOUS | Status: DC | PRN
  Administered 2016-09-07: 200 mL via INTRA_ARTERIAL

## 2016-09-07 MED ORDER — SODIUM CHLORIDE 0.9 % WEIGHT BASED INFUSION: 1.0000 mL/kg/h | INTRAVENOUS | Status: DC

## 2016-09-07 MED ORDER — LABETALOL HCL 5 MG/ML IV SOLN: 10.0000 mg | INTRAVENOUS | Status: AC | PRN

## 2016-09-07 MED ORDER — CLOPIDOGREL BISULFATE 75 MG PO TABS
ORAL_TABLET | ORAL | Status: DC | PRN
  Administered 2016-09-07: 600 mg via ORAL

## 2016-09-07 MED ORDER — ASPIRIN 81 MG PO CHEW
CHEWABLE_TABLET | ORAL | Status: AC
  Filled 2016-09-07: qty 4

## 2016-09-07 MED ORDER — FENTANYL CITRATE (PF) 100 MCG/2ML IJ SOLN
INTRAMUSCULAR | Status: DC | PRN
  Administered 2016-09-07: 25 ug via INTRAVENOUS

## 2016-09-07 MED ORDER — CLOPIDOGREL BISULFATE 300 MG PO TABS
ORAL_TABLET | ORAL | Status: AC
  Filled 2016-09-07: qty 2

## 2016-09-07 MED ORDER — SODIUM CHLORIDE 0.9% FLUSH
3.0000 mL | Freq: Once | INTRAVENOUS | Status: AC
Start: 2016-09-07 — End: 2016-09-07
  Administered 2016-09-07: 3 mL via INTRAVENOUS

## 2016-09-07 MED ORDER — AMLODIPINE BESYLATE 5 MG PO TABS
5.0000 mg | ORAL_TABLET | Freq: Every day | ORAL | Status: DC
  Administered 2016-09-07 – 2016-09-08 (×2): 5 mg via ORAL
  Filled 2016-09-07 (×2): qty 1

## 2016-09-07 SURGICAL SUPPLY — 14 items
CATH 5FR PIGTAIL DIAGNOSTIC (CATHETERS) ×4 IMPLANT
CATH INFINITI 5FR JL4 (CATHETERS) ×4 IMPLANT
CATH INFINITI JR4 5F (CATHETERS) ×4 IMPLANT
CATH VISTA GUIDE 6FR JR4 (CATHETERS) ×4 IMPLANT
DEVICE CLOSURE MYNXGRIP 6/7F (Vascular Products) ×4 IMPLANT
DEVICE INFLAT 30 PLUS (MISCELLANEOUS) ×4 IMPLANT
KIT MANI 3VAL PERCEP (MISCELLANEOUS) ×4 IMPLANT
NEEDLE PERC 18GX7CM (NEEDLE) ×4 IMPLANT
PACK CARDIAC CATH (CUSTOM PROCEDURE TRAY) ×4 IMPLANT
SHEATH AVANTI 5FR X 11CM (SHEATH) ×4 IMPLANT
SHEATH AVANTI 6FR X 11CM (SHEATH) ×4 IMPLANT
STENT XIENCE ALPINE RX 2.75X15 (Permanent Stent) ×4 IMPLANT
WIRE ASAHI PROWATER 180CM (WIRE) ×4 IMPLANT
WIRE EMERALD 3MM-J .035X150CM (WIRE) ×4 IMPLANT

## 2016-09-07 NOTE — Progress Notes (Signed)
Subjective:   Patient known to our practice from outpatient dialysis. She is followed by Dr. Juleen China. Patient was admitted for coronary angiography and received a stent in RCA. Stenosis improved from 75% to 0% At present, she feels well. No shortness of breath. No nausea or vomiting. She and her husband are asking if she is able to skip dialysis tonight.   Objective:  Vital signs in last 24 hours:  Temp:  [98.2 F (36.8 C)-98.7 F (37.1 C)] 98.7 F (37.1 C) (02/15 1445) Pulse Rate:  [82-92] 86 (02/15 1445) Resp:  [13-21] 18 (02/15 1445) BP: (136-160)/(58-76) 159/58 (02/15 1445) SpO2:  [90 %-99 %] 99 % (02/15 1445) Weight:  [67.1 kg (148 lb)] 67.1 kg (148 lb) (02/15 0727)  Weight change:  Filed Weights   09/07/16 0727  Weight: 67.1 kg (148 lb)    Intake/Output:    Intake/Output Summary (Last 24 hours) at 09/07/16 1605 Last data filed at 09/07/16 1512  Gross per 24 hour  Intake                0 ml  Output              200 ml  Net             -200 ml     Physical Exam: General: No acute distress, laying in the bed   HEENT Anicteric, moist oral mucous membranes   Neck Supple   Pulm/lungs Left basilar crackles, normal breathing effort on room air, otherwise clear   CVS/Heart No rub or gallop   Abdomen:  Soft, nontender   Extremities: No peripheral edema   Neurologic: Alert, oriented   Skin: No acute rashes   Access: PD catheter intact. No erythema or tenderness        Basic Metabolic Panel:  No results for input(s): NA, K, CL, CO2, GLUCOSE, BUN, CREATININE, CALCIUM, MG, PHOS in the last 168 hours.   CBC: No results for input(s): WBC, NEUTROABS, HGB, HCT, MCV, PLT in the last 168 hours.    Microbiology:  No results found for this or any previous visit (from the past 720 hour(s)).  Coagulation Studies: No results for input(s): LABPROT, INR in the last 72 hours.  Urinalysis: No results for input(s): COLORURINE, LABSPEC, PHURINE, GLUCOSEU, HGBUR,  BILIRUBINUR, KETONESUR, PROTEINUR, UROBILINOGEN, NITRITE, LEUKOCYTESUR in the last 72 hours.  Invalid input(s): APPERANCEUR    Imaging: No results found.   Medications:   . sodium chloride 50 mL/hr at 09/07/16 0754  . sodium chloride     . amLODipine  5 mg Oral Daily  . aspirin  81 mg Oral Daily  . carvedilol  12.5 mg Oral BID WC  . [START ON 09/08/2016] clopidogrel  75 mg Oral Q breakfast  . furosemide  80 mg Oral Daily  . hydrALAZINE  50 mg Oral BID  . insulin aspart  4 Units Subcutaneous TID WC  . insulin glargine  24 Units Subcutaneous Q2200  . [START ON 09/08/2016] levothyroxine  75 mcg Oral QAC breakfast  . lisinopril  10 mg Oral Daily  . sevelamer carbonate  2.4 g Oral TID WC  . sodium bicarbonate  1,300 mg Oral BID  . sodium chloride flush  3 mL Intravenous Q12H   sodium chloride, acetaminophen, hydrALAZINE, labetalol, ondansetron (ZOFRAN) IV, sodium chloride flush  Assessment/ Plan:  66 y.o. female with diabetes mellitus type 2, diabetic neuropathy, anemia, hypertension, hyperlipidemia, diabetic nephropathy, ESRD due to DM, PD catheter placed 11/25/15, anemia of CKD,  SHPTH, admission for respiratory failure 07/16/16  [facility]/PD CCPD 9 hours 4 exchanges Fill volume 1770mL  1.  End-stage renal disease on peritoneal dialysis 6 nights a week. Patient wants to skip dialysis tonight. We will check a stat potassium. If that is within normal limits, we will be able to skip her dialysis. She is not fluid overloaded. She just ate a few hours ago. Currently on room air. Potassium is high, patient has agreed to take peritoneal dialysis tonight  2. Hypertension Continue home meds    LOS: 0 [place] 2/15/20184:05 PM

## 2016-09-07 NOTE — Progress Notes (Signed)
Blood sugar checked,@1353  =57, thus given 1/2 amp d50, rechecked now = 160

## 2016-09-07 NOTE — Progress Notes (Signed)
Patient clinically stable post heart cath with stent placement to mid rca,no bleeding nor hematoma at right groin site, husband at bedside. sr per monitor. Report called to [place] on telemetry ([id number]), with plan reviewed. Pt denies complaints at this time.

## 2016-09-08 DIAGNOSIS — E785 Hyperlipidemia, unspecified: Secondary | ICD-10-CM | POA: Diagnosis not present

## 2016-09-08 DIAGNOSIS — I5022 Chronic systolic (congestive) heart failure: Secondary | ICD-10-CM | POA: Diagnosis not present

## 2016-09-08 DIAGNOSIS — N186 End stage renal disease: Secondary | ICD-10-CM | POA: Diagnosis not present

## 2016-09-08 DIAGNOSIS — I25119 Atherosclerotic heart disease of native coronary artery with unspecified angina pectoris: Secondary | ICD-10-CM | POA: Diagnosis not present

## 2016-09-08 DIAGNOSIS — E11319 Type 2 diabetes mellitus with unspecified diabetic retinopathy without macular edema: Secondary | ICD-10-CM | POA: Diagnosis not present

## 2016-09-08 DIAGNOSIS — I132 Hypertensive heart and chronic kidney disease with heart failure and with stage 5 chronic kidney disease, or end stage renal disease: Secondary | ICD-10-CM | POA: Diagnosis not present

## 2016-09-08 DIAGNOSIS — I2510 Atherosclerotic heart disease of native coronary artery without angina pectoris: Secondary | ICD-10-CM | POA: Diagnosis not present

## 2016-09-08 DIAGNOSIS — Z992 Dependence on renal dialysis: Secondary | ICD-10-CM | POA: Diagnosis not present

## 2016-09-08 DIAGNOSIS — E1122 Type 2 diabetes mellitus with diabetic chronic kidney disease: Secondary | ICD-10-CM | POA: Diagnosis not present

## 2016-09-08 DIAGNOSIS — E039 Hypothyroidism, unspecified: Secondary | ICD-10-CM | POA: Diagnosis not present

## 2016-09-08 LAB — CBC
HCT: 27.9 % — ABNORMAL LOW (ref 35.0–47.0)
Hemoglobin: 9.7 g/dL — ABNORMAL LOW (ref 12.0–16.0)
MCH: 32.2 pg (ref 26.0–34.0)
MCHC: 34.6 g/dL (ref 32.0–36.0)
MCV: 93 fL (ref 80.0–100.0)
PLATELETS: 131 10*3/uL — AB (ref 150–440)
RBC: 3.01 MIL/uL — AB (ref 3.80–5.20)
RDW: 16.6 % — AB (ref 11.5–14.5)
WBC: 10.2 10*3/uL (ref 3.6–11.0)

## 2016-09-08 LAB — GLUCOSE, CAPILLARY
GLUCOSE-CAPILLARY: 59 mg/dL — AB (ref 65–99)
GLUCOSE-CAPILLARY: 125 mg/dL — AB (ref 65–99)
Glucose-Capillary: 109 mg/dL — ABNORMAL HIGH (ref 65–99)
Glucose-Capillary: 114 mg/dL — ABNORMAL HIGH (ref 65–99)
Glucose-Capillary: 106 mg/dL — ABNORMAL HIGH (ref 65–99)

## 2016-09-08 LAB — BASIC METABOLIC PANEL
Anion gap: 17 — ABNORMAL HIGH (ref 5–15)
BUN: 97 mg/dL — AB (ref 6–20)
CALCIUM: 6.5 mg/dL — AB (ref 8.9–10.3)
CHLORIDE: 100 mmol/L — AB (ref 101–111)
CO2: 20 mmol/L — AB (ref 22–32)
CREATININE: 14.47 mg/dL — AB (ref 0.44–1.00)
GFR calc non Af Amer: 2 mL/min — ABNORMAL LOW (ref >60)
GFR, EST AFRICAN AMERICAN: 3 mL/min — AB (ref >60)
Glucose, Bld: 96 mg/dL (ref 65–99)
Potassium: 5.3 mmol/L — ABNORMAL HIGH (ref 3.5–5.1)
Sodium: 137 mmol/L (ref 135–145)

## 2016-09-08 MED ORDER — CLOPIDOGREL BISULFATE 75 MG PO TABS: 75.0000 mg | ORAL_TABLET | Freq: Every day | ORAL | 6 refills | Status: DC

## 2016-09-08 NOTE — Progress Notes (Signed)
IVs and tele removed from patient. Discharge instructions along with hard copy prescription given to patient. Patient and husband verbalized understanding. No distress at this time. Husband is at bedside and will be transporting patient home.

## 2016-09-08 NOTE — Care Management (Signed)
Elective cardiac cath with PCI.  Is not discharging on cost prohibitive anti platelet medication

## 2016-09-08 NOTE — Plan of Care (Signed)
Problem: Safety: Goal: Ability to remain free from injury will improve Outcome: Progressing Pt has remained free from falls/injury this shift. Encouraged to call if needing help getting up. Non skid socks in place  Problem: Pain Managment: Goal: General experience of comfort will improve Outcome: Completed/Met Date Met: 09/08/16 Pt with no complaints of pain this shift.  Problem: Cardiovascular: Goal: Vascular access site(s) Level 0-1 will be maintained Outcome: Completed/Met Date Met: 09/08/16 Vascular site continues to stay at a level 0. No signs of complication noted, mynx closure, gauze dressing clean dry & intact. Will continue to monitor.

## 2016-09-08 NOTE — Discharge Summary (Signed)
Physician Discharge Summary  Patient ID: Bethany Lee MRN: [id number] DOB/AGE: [birth year] 66 y.o.  Admit date: 09/07/2016 Discharge date: 09/08/2016  Primary Discharge Diagnosis Chest pain Secondary Discharge Diagnosis Coronary artery disease  Significant Diagnostic Studies: yes  Consults: None  Hospital Course: The patient underwent elective cardiac catheterization on 09/07/2016 stenosis in the mid to distal right coronary artery. The patient underwent successful percutaneous coronary intervention receiving a 2.75 x 15 mm Xience Alpine drug-eluting stent with an excellent angiographic result. The patient had an uncomplicated hospital course, was ambulating on the morning of 09/08/2016 without difficulty and was discharged home.   Discharge Exam: Blood pressure 125/69, pulse 80, temperature 98.4 F (36.9 C), temperature source Oral, resp. rate 16, height 5\' 6"  (1.676 m), weight 67.1 kg (148 lb), SpO2 96 %.   General appearance: alert Head: Normocephalic, without obvious abnormality, atraumatic Eyes: conjunctivae/corneas clear. PERRL, EOM's intact. Fundi benign. Ears: normal TM's and external ear canals both ears Nose: Nares normal. Septum midline. Mucosa normal. No drainage or sinus tenderness. Throat: lips, mucosa, and tongue normal; teeth and gums normal Neck: no adenopathy, no carotid bruit, no JVD, supple, symmetrical, trachea midline and thyroid not enlarged, symmetric, no tenderness/mass/nodules Back: symmetric, no curvature. ROM normal. No CVA tenderness. Resp: clear to auscultation bilaterally Chest wall: no tenderness Cardio: regular rate and rhythm, S1, S2 normal, no murmur, click, rub or gallop GI: soft, non-tender; bowel sounds normal; no masses,  no organomegaly Pelvic: deferred Extremities: extremities normal, atraumatic, no cyanosis or edema Pulses: 2+ and symmetric Skin: Skin color, texture, turgor normal. No rashes or lesions Lymph nodes: Cervical,  supraclavicular, and axillary nodes normal. Labs:   Lab Results  Component Value Date   WBC 10.2 09/08/2016   HGB 9.7 (L) 09/08/2016   HCT 27.9 (L) 09/08/2016   MCV 93.0 09/08/2016   PLT 131 (L) 09/08/2016    Recent Labs Lab 09/08/16 0511  NA 137  K 5.3*  CL 100*  CO2 20*  BUN 97*  CREATININE 14.47*  CALCIUM 6.5*  GLUCOSE 96      Radiology:  EKG: Normal sinus rhythm  FOLLOW UP PLANS AND APPOINTMENTS Discharge Instructions    AMB Referral to Cardiac Rehabilitation - Phase II    Complete by:  As directed    Diagnosis:  Coronary Stents     Allergies as of 09/08/2016      Reactions   Penicillins    Childhood loss of consciousness  Has patient had a PCN reaction causing immediate rash, facial/tongue/throat swelling, SOB or lightheadedness with hypotension: Yes Has patient had a PCN reaction causing severe rash involving mucus membranes or skin necrosis: No Has patient had a PCN reaction that required hospitalization Unknown Has patient had a PCN reaction occurring within the last 10 years: Unknown If all of the above answers are "NO", then may proceed with Cephalosporin use.   Valacyclovir Nausea Only      Medication List    STOP taking these medications   oxyCODONE-acetaminophen 5-325 MG tablet Commonly known as:  ROXICET     TAKE these medications   amLODipine 5 MG tablet Commonly known as:  NORVASC Take 5 mg by mouth daily.   aspirin EC 81 MG tablet Take 1 tablet (81 mg total) by mouth daily. Start taking after NOV 15   calcium carbonate 500 MG chewable tablet Commonly known as:  TUMS - dosed in mg elemental calcium Chew 2 tablets by mouth daily.   carvedilol 12.5 MG tablet Commonly  known as:  COREG Take 1 tablet (12.5 mg total) by mouth 2 (two) times daily with a meal.   clopidogrel 75 MG tablet Commonly known as:  PLAVIX Take 1 tablet (75 mg total) by mouth daily with breakfast.   DELSYM 30 MG/5ML liquid Generic drug:  dextromethorphan Take  60 mg by mouth as needed for cough.   docusate sodium 100 MG capsule Commonly known as:  COLACE Take 1 capsule (100 mg total) by mouth daily as needed. What changed:  reasons to take this   folic acid-vitamin b complex-vitamin c-selenium-zinc 3 MG Tabs tablet Take 1 tablet by mouth daily.   furosemide 80 MG tablet Commonly known as:  LASIX Take 80 mg by mouth 2 (two) times daily.   gentamicin cream 0.1 % Commonly known as:  GARAMYCIN Apply 1 application topically daily. What changed:  additional instructions   hydrALAZINE 50 MG tablet Commonly known as:  APRESOLINE Take 50 mg by mouth 2 (two) times daily.   Insulin Glargine 300 UNIT/ML Sopn Commonly known as:  TOUJEO SOLOSTAR Inject 20 Units into the skin at bedtime. What changed:  how much to take   levothyroxine 50 MCG tablet Commonly known as:  SYNTHROID, LEVOTHROID Take 50 mcg by mouth daily before breakfast.   lisinopril 10 MG tablet Commonly known as:  PRINIVIL,ZESTRIL Take 10 mg by mouth daily.   lovastatin 40 MG tablet Commonly known as:  MEVACOR Take 40 mg by mouth daily with supper.   NOVOLOG FLEXPEN 100 UNIT/ML FlexPen Generic drug:  insulin aspart Inject 4-8 Units into the skin 3 (three) times daily with meals. Sliding scale below 200 take 4 units, 200-250=6 units, >250= 8 units   RENVELA 2.4 g Pack Generic drug:  sevelamer carbonate Take 2.4 g by mouth 3 (three) times daily with meals.   vitamin B-12 1000 MCG tablet Commonly known as:  CYANOCOBALAMIN Take 1,000 mcg by mouth daily.      Follow-up Information    Isaias Cowman, MD Follow up in 1 week(s).   Specialty:  Cardiology Contact information: [facility] [place] [address] [phone]           BRING ALL MEDICATIONS WITH YOU TO FOLLOW UP APPOINTMENTS  Time spent with patient to include physician time: 25 minutes Signed:  Isaias Cowman MD, PhD, [facility] 09/08/2016, 7:50  AM

## 2016-09-08 NOTE — Care Management Obs Status (Signed)
[place] NOTIFICATION   Patient Details  Name: Bethany Lee MRN: [id number] Date of Birth: 1951/07/21   Medicare Observation Status Notification Given:  No  < 24 hrs   Katrina Stack, RN 09/08/2016, 9:00 AM

## 2016-09-09 DIAGNOSIS — Z992 Dependence on renal dialysis: Secondary | ICD-10-CM | POA: Diagnosis not present

## 2016-09-09 DIAGNOSIS — N186 End stage renal disease: Secondary | ICD-10-CM | POA: Diagnosis not present

## 2016-09-10 DIAGNOSIS — Z992 Dependence on renal dialysis: Secondary | ICD-10-CM | POA: Diagnosis not present

## 2016-09-10 DIAGNOSIS — N186 End stage renal disease: Secondary | ICD-10-CM | POA: Diagnosis not present

## 2016-09-11 DIAGNOSIS — Z992 Dependence on renal dialysis: Secondary | ICD-10-CM | POA: Diagnosis not present

## 2016-09-11 DIAGNOSIS — N186 End stage renal disease: Secondary | ICD-10-CM | POA: Diagnosis not present

## 2016-09-12 DIAGNOSIS — N186 End stage renal disease: Secondary | ICD-10-CM | POA: Diagnosis not present

## 2016-09-12 DIAGNOSIS — Z992 Dependence on renal dialysis: Secondary | ICD-10-CM | POA: Diagnosis not present

## 2016-09-13 DIAGNOSIS — N186 End stage renal disease: Secondary | ICD-10-CM | POA: Diagnosis not present

## 2016-09-13 DIAGNOSIS — Z992 Dependence on renal dialysis: Secondary | ICD-10-CM | POA: Diagnosis not present

## 2016-09-14 DIAGNOSIS — I10 Essential (primary) hypertension: Secondary | ICD-10-CM | POA: Diagnosis not present

## 2016-09-14 DIAGNOSIS — I2510 Atherosclerotic heart disease of native coronary artery without angina pectoris: Secondary | ICD-10-CM | POA: Diagnosis not present

## 2016-09-14 DIAGNOSIS — E1165 Type 2 diabetes mellitus with hyperglycemia: Secondary | ICD-10-CM | POA: Diagnosis not present

## 2016-09-14 DIAGNOSIS — Z955 Presence of coronary angioplasty implant and graft: Secondary | ICD-10-CM | POA: Insufficient documentation

## 2016-09-14 DIAGNOSIS — E782 Mixed hyperlipidemia: Secondary | ICD-10-CM | POA: Diagnosis not present

## 2016-09-14 DIAGNOSIS — Z992 Dependence on renal dialysis: Secondary | ICD-10-CM | POA: Diagnosis not present

## 2016-09-14 DIAGNOSIS — N186 End stage renal disease: Secondary | ICD-10-CM | POA: Diagnosis not present

## 2016-09-14 DIAGNOSIS — Z794 Long term (current) use of insulin: Secondary | ICD-10-CM | POA: Diagnosis not present

## 2016-09-15 DIAGNOSIS — N186 End stage renal disease: Secondary | ICD-10-CM | POA: Diagnosis not present

## 2016-09-15 DIAGNOSIS — Z992 Dependence on renal dialysis: Secondary | ICD-10-CM | POA: Diagnosis not present

## 2016-09-16 DIAGNOSIS — N186 End stage renal disease: Secondary | ICD-10-CM | POA: Diagnosis not present

## 2016-09-16 DIAGNOSIS — Z992 Dependence on renal dialysis: Secondary | ICD-10-CM | POA: Diagnosis not present

## 2016-09-17 DIAGNOSIS — Z992 Dependence on renal dialysis: Secondary | ICD-10-CM | POA: Diagnosis not present

## 2016-09-17 DIAGNOSIS — N186 End stage renal disease: Secondary | ICD-10-CM | POA: Diagnosis not present

## 2016-09-18 DIAGNOSIS — Z992 Dependence on renal dialysis: Secondary | ICD-10-CM | POA: Diagnosis not present

## 2016-09-18 DIAGNOSIS — N186 End stage renal disease: Secondary | ICD-10-CM | POA: Diagnosis not present

## 2016-09-19 DIAGNOSIS — N186 End stage renal disease: Secondary | ICD-10-CM | POA: Diagnosis not present

## 2016-09-19 DIAGNOSIS — Z992 Dependence on renal dialysis: Secondary | ICD-10-CM | POA: Diagnosis not present

## 2016-09-20 ENCOUNTER — Ambulatory Visit
Admission: RE | Admit: 2016-09-20 | Discharge: 2016-09-20 | Disposition: A | Discharge status: Home / Self Care | Payer: Medicare HMO | Source: Ambulatory Visit | Attending: Nephrology | Admitting: Nephrology

## 2016-09-20 ENCOUNTER — Other Ambulatory Visit: Payer: Self-pay | Admitting: Nephrology

## 2016-09-20 DIAGNOSIS — Z992 Dependence on renal dialysis: Secondary | ICD-10-CM | POA: Diagnosis not present

## 2016-09-20 DIAGNOSIS — R52 Pain, unspecified: Secondary | ICD-10-CM

## 2016-09-20 DIAGNOSIS — Z452 Encounter for adjustment and management of vascular access device: Secondary | ICD-10-CM | POA: Insufficient documentation

## 2016-09-20 DIAGNOSIS — N186 End stage renal disease: Secondary | ICD-10-CM | POA: Diagnosis not present

## 2016-09-21 ENCOUNTER — Other Ambulatory Visit (INDEPENDENT_AMBULATORY_CARE_PROVIDER_SITE_OTHER): Payer: Self-pay

## 2016-09-21 ENCOUNTER — Inpatient Hospital Stay
Admission: EM | Admit: 2016-09-21 | Discharge: 2016-09-23 | DRG: 640 | Disposition: A | Discharge status: Home / Self Care | Payer: Medicare HMO | Attending: Internal Medicine | Admitting: Internal Medicine

## 2016-09-21 ENCOUNTER — Ambulatory Visit
Admission: RE | Admit: 2016-09-21 | Discharge: 2016-09-21 | Disposition: A | Discharge status: Home / Self Care | Payer: Medicare HMO | Source: Ambulatory Visit | Attending: Vascular Surgery | Admitting: Vascular Surgery

## 2016-09-21 ENCOUNTER — Encounter
Admission: RE | Disposition: A | Discharge status: Home / Self Care | Payer: Self-pay | Source: Ambulatory Visit | Attending: Vascular Surgery

## 2016-09-21 ENCOUNTER — Emergency Department: Payer: Medicare HMO

## 2016-09-21 DIAGNOSIS — E7800 Pure hypercholesterolemia, unspecified: Secondary | ICD-10-CM | POA: Diagnosis present

## 2016-09-21 DIAGNOSIS — I5033 Acute on chronic diastolic (congestive) heart failure: Secondary | ICD-10-CM | POA: Diagnosis not present

## 2016-09-21 DIAGNOSIS — I132 Hypertensive heart and chronic kidney disease with heart failure and with stage 5 chronic kidney disease, or end stage renal disease: Secondary | ICD-10-CM | POA: Diagnosis present

## 2016-09-21 DIAGNOSIS — E1121 Type 2 diabetes mellitus with diabetic nephropathy: Secondary | ICD-10-CM | POA: Diagnosis present

## 2016-09-21 DIAGNOSIS — J810 Acute pulmonary edema: Secondary | ICD-10-CM

## 2016-09-21 DIAGNOSIS — E8770 Fluid overload, unspecified: Principal | ICD-10-CM | POA: Diagnosis present

## 2016-09-21 DIAGNOSIS — Z87891 Personal history of nicotine dependence: Secondary | ICD-10-CM

## 2016-09-21 DIAGNOSIS — R0603 Acute respiratory distress: Secondary | ICD-10-CM

## 2016-09-21 DIAGNOSIS — E875 Hyperkalemia: Secondary | ICD-10-CM | POA: Diagnosis not present

## 2016-09-21 DIAGNOSIS — Z90710 Acquired absence of both cervix and uterus: Secondary | ICD-10-CM

## 2016-09-21 DIAGNOSIS — N186 End stage renal disease: Secondary | ICD-10-CM

## 2016-09-21 DIAGNOSIS — Z794 Long term (current) use of insulin: Secondary | ICD-10-CM

## 2016-09-21 DIAGNOSIS — J9600 Acute respiratory failure, unspecified whether with hypoxia or hypercapnia: Secondary | ICD-10-CM | POA: Diagnosis not present

## 2016-09-21 DIAGNOSIS — Z452 Encounter for adjustment and management of vascular access device: Secondary | ICD-10-CM | POA: Diagnosis not present

## 2016-09-21 DIAGNOSIS — Z880 Allergy status to penicillin: Secondary | ICD-10-CM

## 2016-09-21 DIAGNOSIS — I5031 Acute diastolic (congestive) heart failure: Secondary | ICD-10-CM | POA: Diagnosis not present

## 2016-09-21 DIAGNOSIS — E1122 Type 2 diabetes mellitus with diabetic chronic kidney disease: Secondary | ICD-10-CM | POA: Diagnosis present

## 2016-09-21 DIAGNOSIS — Z955 Presence of coronary angioplasty implant and graft: Secondary | ICD-10-CM | POA: Diagnosis not present

## 2016-09-21 DIAGNOSIS — Z992 Dependence on renal dialysis: Secondary | ICD-10-CM

## 2016-09-21 DIAGNOSIS — Z9851 Tubal ligation status: Secondary | ICD-10-CM | POA: Diagnosis not present

## 2016-09-21 DIAGNOSIS — D638 Anemia in other chronic diseases classified elsewhere: Secondary | ICD-10-CM | POA: Diagnosis present

## 2016-09-21 DIAGNOSIS — E8809 Other disorders of plasma-protein metabolism, not elsewhere classified: Secondary | ICD-10-CM | POA: Diagnosis present

## 2016-09-21 DIAGNOSIS — T85898A Other specified complication of other internal prosthetic devices, implants and grafts, initial encounter: Secondary | ICD-10-CM | POA: Diagnosis present

## 2016-09-21 DIAGNOSIS — D631 Anemia in chronic kidney disease: Secondary | ICD-10-CM | POA: Diagnosis not present

## 2016-09-21 DIAGNOSIS — E039 Hypothyroidism, unspecified: Secondary | ICD-10-CM | POA: Diagnosis present

## 2016-09-21 DIAGNOSIS — Z79899 Other long term (current) drug therapy: Secondary | ICD-10-CM

## 2016-09-21 DIAGNOSIS — E1140 Type 2 diabetes mellitus with diabetic neuropathy, unspecified: Secondary | ICD-10-CM | POA: Diagnosis not present

## 2016-09-21 DIAGNOSIS — J811 Chronic pulmonary edema: Secondary | ICD-10-CM | POA: Diagnosis not present

## 2016-09-21 DIAGNOSIS — I120 Hypertensive chronic kidney disease with stage 5 chronic kidney disease or end stage renal disease: Secondary | ICD-10-CM | POA: Diagnosis not present

## 2016-09-21 DIAGNOSIS — Z881 Allergy status to other antibiotic agents status: Secondary | ICD-10-CM

## 2016-09-21 DIAGNOSIS — E872 Acidosis: Secondary | ICD-10-CM | POA: Diagnosis present

## 2016-09-21 DIAGNOSIS — Z7982 Long term (current) use of aspirin: Secondary | ICD-10-CM | POA: Diagnosis not present

## 2016-09-21 DIAGNOSIS — R0602 Shortness of breath: Secondary | ICD-10-CM | POA: Diagnosis not present

## 2016-09-21 DIAGNOSIS — E785 Hyperlipidemia, unspecified: Secondary | ICD-10-CM | POA: Diagnosis present

## 2016-09-21 DIAGNOSIS — N2581 Secondary hyperparathyroidism of renal origin: Secondary | ICD-10-CM | POA: Diagnosis not present

## 2016-09-21 DIAGNOSIS — Z7902 Long term (current) use of antithrombotics/antiplatelets: Secondary | ICD-10-CM | POA: Diagnosis not present

## 2016-09-21 HISTORY — PX: DIALYSIS/PERMA CATHETER INSERTION: CATH118288

## 2016-09-21 LAB — CBC WITH DIFFERENTIAL/PLATELET
BASOS ABS: 0.1 10*3/uL (ref 0–0.1)
BASOS PCT: 1 %
Eosinophils Absolute: 0.1 10*3/uL (ref 0–0.7)
Eosinophils Relative: 1 %
HEMATOCRIT: 31.2 % — AB (ref 35.0–47.0)
HEMOGLOBIN: 10.3 g/dL — AB (ref 12.0–16.0)
LYMPHS PCT: 8 %
Lymphs Abs: 1 10*3/uL (ref 1.0–3.6)
MCH: 30.4 pg (ref 26.0–34.0)
MCHC: 32.9 g/dL (ref 32.0–36.0)
MCV: 92.5 fL (ref 80.0–100.0)
MONO ABS: 0.6 10*3/uL (ref 0.2–0.9)
Monocytes Relative: 5 %
NEUTROS ABS: 9.8 10*3/uL — AB (ref 1.4–6.5)
NEUTROS PCT: 85 %
Platelets: 237 10*3/uL (ref 150–440)
RBC: 3.38 MIL/uL — ABNORMAL LOW (ref 3.80–5.20)
RDW: 16.6 % — ABNORMAL HIGH (ref 11.5–14.5)
WBC: 11.6 10*3/uL — ABNORMAL HIGH (ref 3.6–11.0)

## 2016-09-21 LAB — COMPREHENSIVE METABOLIC PANEL
ALBUMIN: 2.9 g/dL — AB (ref 3.5–5.0)
ALK PHOS: 54 U/L (ref 38–126)
ALT: 17 U/L (ref 14–54)
AST: 16 U/L (ref 15–41)
Anion gap: 13 (ref 5–15)
BILIRUBIN TOTAL: 0.6 mg/dL (ref 0.3–1.2)
BUN: 89 mg/dL — AB (ref 6–20)
CALCIUM: 7 mg/dL — AB (ref 8.9–10.3)
CO2: 22 mmol/L (ref 22–32)
Chloride: 101 mmol/L (ref 101–111)
Creatinine, Ser: 13.4 mg/dL — ABNORMAL HIGH (ref 0.44–1.00)
GFR calc Af Amer: 3 mL/min — ABNORMAL LOW (ref >60)
GFR, EST NON AFRICAN AMERICAN: 2 mL/min — AB (ref >60)
Glucose, Bld: 158 mg/dL — ABNORMAL HIGH (ref 65–99)
Potassium: 5.9 mmol/L — ABNORMAL HIGH (ref 3.5–5.1)
Sodium: 136 mmol/L (ref 135–145)
TOTAL PROTEIN: 6.2 g/dL — AB (ref 6.5–8.1)

## 2016-09-21 LAB — GLUCOSE, CAPILLARY
Glucose-Capillary: 74 mg/dL (ref 65–99)
Glucose-Capillary: 194 mg/dL — ABNORMAL HIGH (ref 65–99)

## 2016-09-21 LAB — BLOOD GAS, VENOUS
ACID-BASE DEFICIT: 7.8 mmol/L — AB (ref 0.0–2.0)
BICARBONATE: 20.6 mmol/L (ref 20.0–28.0)
Delivery systems: POSITIVE
FIO2: 1
O2 SAT: 91.5 %
PCO2 VEN: 54 mmHg (ref 44.0–60.0)
PH VEN: 7.19 — AB (ref 7.250–7.430)
PO2 VEN: 77 mmHg — AB (ref 32.0–45.0)
Patient temperature: 37

## 2016-09-21 LAB — POTASSIUM (ARMC VASCULAR LAB ONLY): POTASSIUM (ARMC VASCULAR LAB): 5.5 — AB (ref 3.5–5.1)

## 2016-09-21 LAB — BRAIN NATRIURETIC PEPTIDE: B Natriuretic Peptide: 1794 pg/mL — ABNORMAL HIGH (ref 0.0–100.0)

## 2016-09-21 LAB — TROPONIN I: TROPONIN I: 0.03 ng/mL — AB (ref <0.03)

## 2016-09-21 SURGERY — DIALYSIS/PERMA CATHETER INSERTION
Anesthesia: Moderate Sedation

## 2016-09-21 MED ORDER — FUROSEMIDE 40 MG PO TABS: 80.0000 mg | ORAL_TABLET | Freq: Two times a day (BID) | ORAL | Status: DC

## 2016-09-21 MED ORDER — FENTANYL CITRATE (PF) 100 MCG/2ML IJ SOLN
INTRAMUSCULAR | Status: AC
Start: 2016-09-21 — End: 2016-09-21
  Filled 2016-09-21: qty 2

## 2016-09-21 MED ORDER — HEPARIN SODIUM (PORCINE) 10000 UNIT/ML IJ SOLN
INTRAMUSCULAR | Status: AC
  Filled 2016-09-21: qty 1

## 2016-09-21 MED ORDER — ASPIRIN EC 81 MG PO TBEC
81.0000 mg | DELAYED_RELEASE_TABLET | Freq: Every day | ORAL | Status: DC
Start: 2016-09-22 — End: 2016-09-23
  Administered 2016-09-22 – 2016-09-23 (×2): 81 mg via ORAL
  Filled 2016-09-21 (×2): qty 1

## 2016-09-21 MED ORDER — CLOPIDOGREL BISULFATE 75 MG PO TABS
75.0000 mg | ORAL_TABLET | Freq: Every day | ORAL | Status: DC
  Administered 2016-09-22 – 2016-09-23 (×2): 75 mg via ORAL
  Filled 2016-09-21 (×2): qty 1

## 2016-09-21 MED ORDER — DEXTROSE 50 % IV SOLN
INTRAVENOUS | Status: AC
  Filled 2016-09-21: qty 50

## 2016-09-21 MED ORDER — CALCIUM ACETATE (PHOS BINDER) 667 MG PO CAPS
667.0000 mg | ORAL_CAPSULE | Freq: Three times a day (TID) | ORAL | Status: DC
  Administered 2016-09-22: 667 mg via ORAL
  Filled 2016-09-21 (×2): qty 1

## 2016-09-21 MED ORDER — LORAZEPAM 2 MG/ML IJ SOLN
INTRAMUSCULAR | Status: AC
  Administered 2016-09-21: 1 mg via INTRAVENOUS
  Filled 2016-09-21: qty 1

## 2016-09-21 MED ORDER — HEPARIN SODIUM (PORCINE) 5000 UNIT/ML IJ SOLN
INTRAMUSCULAR | Status: AC
  Filled 2016-09-21: qty 1

## 2016-09-21 MED ORDER — SODIUM CHLORIDE 0.9 % IV SOLN
1.0000 g | Freq: Once | INTRAVENOUS | Status: AC
  Administered 2016-09-21: 1 g via INTRAVENOUS
  Filled 2016-09-21: qty 10

## 2016-09-21 MED ORDER — HYDRALAZINE HCL 50 MG PO TABS
ORAL_TABLET | ORAL | Status: AC
  Filled 2016-09-21: qty 1

## 2016-09-21 MED ORDER — FUROSEMIDE 10 MG/ML IJ SOLN
INTRAMUSCULAR | Status: AC
  Filled 2016-09-21: qty 8

## 2016-09-21 MED ORDER — ASPIRIN 300 MG RE SUPP
300.0000 mg | RECTAL | Status: AC
  Administered 2016-09-21: 300 mg via RECTAL

## 2016-09-21 MED ORDER — SEVELAMER CARBONATE 2.4 G PO PACK
2.4000 g | PACK | Freq: Three times a day (TID) | ORAL | Status: DC
  Administered 2016-09-22 – 2016-09-23 (×3): 2.4 g via ORAL
  Filled 2016-09-21 (×5): qty 1

## 2016-09-21 MED ORDER — LISINOPRIL 5 MG PO TABS
5.0000 mg | ORAL_TABLET | Freq: Every day | ORAL | Status: DC
  Administered 2016-09-22 – 2016-09-23 (×2): 5 mg via ORAL
  Filled 2016-09-21 (×2): qty 1

## 2016-09-21 MED ORDER — ACETAMINOPHEN 325 MG PO TABS: 650.0000 mg | ORAL_TABLET | ORAL | Status: DC | PRN

## 2016-09-21 MED ORDER — MIDAZOLAM HCL 2 MG/2ML IJ SOLN
INTRAMUSCULAR | Status: DC | PRN
  Administered 2016-09-21: 2 mg via INTRAVENOUS
  Administered 2016-09-21: 1 mg via INTRAVENOUS

## 2016-09-21 MED ORDER — DEXTROSE 50 % IV SOLN
25.0000 g | Freq: Once | INTRAVENOUS | Status: AC
  Administered 2016-09-21: 25 g via INTRAVENOUS
  Filled 2016-09-21 (×2): qty 50

## 2016-09-21 MED ORDER — ASPIRIN 300 MG RE SUPP
RECTAL | Status: AC
  Filled 2016-09-21: qty 1

## 2016-09-21 MED ORDER — SODIUM CHLORIDE 0.9% FLUSH: 3.0000 mL | Freq: Two times a day (BID) | INTRAVENOUS | Status: DC

## 2016-09-21 MED ORDER — HEPARIN SODIUM (PORCINE) 5000 UNIT/ML IJ SOLN
5000.0000 [IU] | Freq: Three times a day (TID) | INTRAMUSCULAR | Status: DC
  Administered 2016-09-21 – 2016-09-23 (×6): 5000 [IU] via SUBCUTANEOUS
  Filled 2016-09-21 (×5): qty 1

## 2016-09-21 MED ORDER — MIDAZOLAM HCL 5 MG/5ML IJ SOLN
INTRAMUSCULAR | Status: AC
  Filled 2016-09-21: qty 5

## 2016-09-21 MED ORDER — PRAVASTATIN SODIUM 20 MG PO TABS: 20.0000 mg | ORAL_TABLET | Freq: Every day | ORAL | Status: DC

## 2016-09-21 MED ORDER — LORAZEPAM 2 MG/ML IJ SOLN
1.0000 mg | Freq: Once | INTRAMUSCULAR | Status: AC
  Administered 2016-09-21: 1 mg via INTRAVENOUS

## 2016-09-21 MED ORDER — CARVEDILOL 12.5 MG PO TABS
12.5000 mg | ORAL_TABLET | Freq: Two times a day (BID) | ORAL | Status: DC
  Administered 2016-09-22 – 2016-09-23 (×2): 12.5 mg via ORAL
  Filled 2016-09-21: qty 2
  Filled 2016-09-21: qty 1

## 2016-09-21 MED ORDER — CLINDAMYCIN PHOSPHATE 300 MG/50ML IV SOLN: 300.0000 mg | Freq: Once | INTRAVENOUS | Status: DC

## 2016-09-21 MED ORDER — DEXTROSE 50 % IV SOLN
50.0000 mL | Freq: Once | INTRAVENOUS | Status: AC
  Administered 2016-09-21: 50 mL via INTRAVENOUS

## 2016-09-21 MED ORDER — AMLODIPINE BESYLATE 5 MG PO TABS
5.0000 mg | ORAL_TABLET | Freq: Every day | ORAL | Status: DC
  Administered 2016-09-22 – 2016-09-23 (×2): 5 mg via ORAL
  Filled 2016-09-21 (×2): qty 1

## 2016-09-21 MED ORDER — INSULIN ASPART 100 UNIT/ML ~~LOC~~ SOLN
10.0000 [IU] | Freq: Once | SUBCUTANEOUS | Status: AC
  Administered 2016-09-21: 10 [IU] via INTRAVENOUS
  Filled 2016-09-21: qty 10

## 2016-09-21 MED ORDER — VITAMIN B-12 1000 MCG PO TABS
1000.0000 ug | ORAL_TABLET | Freq: Every day | ORAL | Status: DC
  Administered 2016-09-22 – 2016-09-23 (×2): 1000 ug via ORAL
  Filled 2016-09-21 (×2): qty 1

## 2016-09-21 MED ORDER — HYDRALAZINE HCL 50 MG PO TABS
50.0000 mg | ORAL_TABLET | Freq: Two times a day (BID) | ORAL | Status: DC
  Administered 2016-09-22 (×2): 50 mg via ORAL
  Filled 2016-09-21 (×3): qty 1

## 2016-09-21 MED ORDER — SODIUM CHLORIDE 0.9% FLUSH: 3.0000 mL | INTRAVENOUS | Status: DC | PRN

## 2016-09-21 MED ORDER — SODIUM CHLORIDE 0.9 % IV SOLN: 250.0000 mL | INTRAVENOUS | Status: DC | PRN

## 2016-09-21 MED ORDER — CLINDAMYCIN PHOSPHATE 300 MG/50ML IV SOLN
INTRAVENOUS | Status: AC
  Administered 2016-09-21: 12:00:00
  Filled 2016-09-21: qty 50

## 2016-09-21 MED ORDER — HEPARIN (PORCINE) IN NACL 2-0.9 UNIT/ML-% IJ SOLN
INTRAMUSCULAR | Status: AC
  Filled 2016-09-21: qty 500

## 2016-09-21 MED ORDER — LIDOCAINE-EPINEPHRINE (PF) 2 %-1:200000 IJ SOLN
INTRAMUSCULAR | Status: AC
  Filled 2016-09-21: qty 20

## 2016-09-21 MED ORDER — SODIUM BICARBONATE 8.4 % IV SOLN
INTRAVENOUS | Status: AC
  Filled 2016-09-21: qty 50

## 2016-09-21 MED ORDER — ASPIRIN 81 MG PO CHEW
CHEWABLE_TABLET | ORAL | Status: AC
  Filled 2016-09-21: qty 4

## 2016-09-21 MED ORDER — FUROSEMIDE 10 MG/ML IJ SOLN
80.0000 mg | Freq: Once | INTRAMUSCULAR | Status: AC
  Administered 2016-09-21: 80 mg via INTRAVENOUS

## 2016-09-21 MED ORDER — ONDANSETRON HCL 4 MG/2ML IJ SOLN
4.0000 mg | Freq: Four times a day (QID) | INTRAMUSCULAR | Status: DC | PRN
Start: 2016-09-21 — End: 2016-09-23

## 2016-09-21 MED ORDER — SODIUM BICARBONATE 650 MG PO TABS: 1300.0000 mg | ORAL_TABLET | Freq: Two times a day (BID) | ORAL | Status: DC

## 2016-09-21 MED ORDER — FENTANYL CITRATE (PF) 100 MCG/2ML IJ SOLN
INTRAMUSCULAR | Status: DC | PRN
  Administered 2016-09-21 (×2): 50 ug via INTRAVENOUS

## 2016-09-21 MED ORDER — SODIUM CHLORIDE 0.9 % IV SOLN: 1.0000 g | Freq: Once | INTRAVENOUS | Status: DC

## 2016-09-21 MED ORDER — SODIUM BICARBONATE 8.4 % IV SOLN
50.0000 meq | Freq: Once | INTRAVENOUS | Status: AC
  Administered 2016-09-21: 50 meq via INTRAVENOUS

## 2016-09-21 MED ORDER — ASPIRIN 81 MG PO CHEW: 324.0000 mg | CHEWABLE_TABLET | ORAL | Status: AC

## 2016-09-21 SURGICAL SUPPLY — 8 items
BIOPATCH WHT 1IN DISK W/4.0 H (GAUZE/BANDAGES/DRESSINGS) ×3 IMPLANT
CATH PALINDROME RT-P 15FX23CM (CATHETERS) ×3 IMPLANT
DERMABOND ADVANCED (GAUZE/BANDAGES/DRESSINGS) ×2
DERMABOND ADVANCED .7 DNX12 (GAUZE/BANDAGES/DRESSINGS) ×1 IMPLANT
PACK ANGIOGRAPHY (CUSTOM PROCEDURE TRAY) ×3 IMPLANT
SUT MNCRL AB 4-0 PS2 18 (SUTURE) ×3 IMPLANT
SUT PROLENE 0 CT 1 30 (SUTURE) ×3 IMPLANT
TOWEL OR 17X26 4PK STRL BLUE (TOWEL DISPOSABLE) ×3 IMPLANT

## 2016-09-21 NOTE — ED Notes (Signed)
Resumed care from [place] rn.  Pt on bipap.  Pt moving about on stretcher.   Pt waiting on admission.

## 2016-09-21 NOTE — H&P (Signed)
[facility] History & Physical Update  The patient was interviewed and re-examined.  The patient's previous History and Physical has been reviewed and is unchanged except her PD catheter is not functional.  This will need revised but no OR time available today.  Will need a permcath for now, and a revision of PD cath next week.  There is no change in the plan of care. We plan to proceed with the scheduled procedure.  Leotis Pain, MD  09/21/2016, 11:32 AM

## 2016-09-21 NOTE — H&P (Signed)
PULMONARY / CRITICAL CARE MEDICINE   Name: Bethany Lee MRN: [id number] DOB: 01-20-1951    ADMISSION DATE:  09/21/2016 CONSULTATION DATE:  09/21/16  REFERRING MD:  Dr. Jimmye Norman  CHIEF COMPLAINT: Shortness of Breath  HISTORY OF PRESENT ILLNESS:   Bethany Lee is a 66 yo female with PMH significant for Anemia, Chronic kidney Disease, Diabetes Mellitus, Hypercholesterolemia, Hypertension and Hypothyroidism. Patient has a history of peritoneal dialysis but apparently her peritoneal catheter was non-functional since Monday(2/26). Therefore she did not get peritoneal dialysis.  Patient reportedly had a right sided chest catheter placed today.  Patient presents to ED with severe shortness of breath due to fluid build up.  Therefore was placed on BiPAP and admitted to the ICU for emergent dialysis.  PAST MEDICAL HISTORY :  She  has a past medical history of Anemia; Chronic kidney disease; Complication of anesthesia; Diabetes mellitus without complication ([place]); Dyspnea; Edema; Hypercholesterolemia; Hypertension; Hypothyroidism; Neuropathy ([place]); PONV (postoperative nausea and vomiting); Shingles; and Tremors of nervous system.  PAST SURGICAL HISTORY: She  has a past surgical history that includes Abdominal hysterectomy; Tubal ligation; Eye surgery; CAPD insertion (N/A, 11/25/2015); CAPD insertion (N/A, 01/06/2016); Cataract extraction w/PHACO (Right, 05/25/2016); Left Heart Cath and Coronary Angiography (Left, 09/07/2016); and Coronary Stent Intervention (N/A, 09/07/2016).  Allergies  Allergen Reactions  . Penicillins     Childhood loss of consciousness  Has patient had a PCN reaction causing immediate rash, facial/tongue/throat swelling, SOB or lightheadedness with hypotension: Yes Has patient had a PCN reaction causing severe rash involving mucus membranes or skin necrosis: No Has patient had a PCN reaction that required hospitalization Unknown Has patient had a PCN reaction occurring within  the last 10 years: Unknown If all of the above answers are "NO", then may proceed with Cephalosporin use.   . Valacyclovir Nausea Only    Current Facility-Administered Medications on File Prior to Encounter  Medication  . clindamycin (CLEOCIN) IVPB 300 mg   Current Outpatient Prescriptions on File Prior to Encounter  Medication Sig  . amLODipine (NORVASC) 5 MG tablet Take 5 mg by mouth daily.  Marland Kitchen aspirin EC 81 MG tablet Take 1 tablet (81 mg total) by mouth daily. Start taking after NOV 15  . carvedilol (COREG) 12.5 MG tablet Take 1 tablet (12.5 mg total) by mouth 2 (two) times daily with a meal.  . clopidogrel (PLAVIX) 75 MG tablet Take 1 tablet (75 mg total) by mouth daily with breakfast.  . furosemide (LASIX) 80 MG tablet Take 80 mg by mouth 2 (two) times daily.  Marland Kitchen gentamicin cream (GARAMYCIN) 0.1 % Apply 1 application topically daily. (Patient taking differently: Apply 1 application topically daily. Catheter dressing changes)  . hydrALAZINE (APRESOLINE) 50 MG tablet Take 50 mg by mouth 2 (two) times daily.  . insulin aspart (NOVOLOG FLEXPEN) 100 UNIT/ML FlexPen Inject 4-8 Units into the skin 3 (three) times daily with meals. Sliding scale below 200 take 4 units, 200-250=6 units, >250= 8 units  . Insulin Glargine (TOUJEO SOLOSTAR) 300 UNIT/ML SOPN Inject 20 Units into the skin at bedtime. (Patient taking differently: Inject 24 Units into the skin at bedtime. )  . Levothyroxine Sodium 75 MCG CAPS Take 75 mcg by mouth daily before breakfast.   . lisinopril (PRINIVIL,ZESTRIL) 10 MG tablet Take 5 mg by mouth daily.   Marland Kitchen lovastatin (MEVACOR) 40 MG tablet Take 40 mg by mouth daily with supper.   . sevelamer carbonate (RENVELA) 2.4 g PACK Take 2.4 g by mouth 3 (  three) times daily with meals.  . vitamin B-12 (CYANOCOBALAMIN) 1000 MCG tablet Take 1,000 mcg by mouth daily.    FAMILY HISTORY:  Her has no family status information on file.    SOCIAL HISTORY: She  reports that she has quit  smoking. She has never used smokeless tobacco. She reports that she does not drink alcohol or use drugs.  REVIEW OF SYSTEMS:   Unable to obtain as the patient is in moderate distress due to fluid build up   SUBJECTIVE:  Unable to obtain as the patient is short of breath. VITAL SIGNS: BP (!) 170/139   Pulse (!) 105   Temp 98.6 F (37 C) (Oral)   Resp (!) 37   Wt 70 kg (154 lb 5.2 oz)   SpO2 100%   BMI 24.91 kg/m   HEMODYNAMICS:        INTAKE / OUTPUT: No intake/output data recorded.  PHYSICAL EXAMINATION: General: patient found on BiPAP, in moderate distress Neuro:  Alert and oriented, follows command HEENT:  Atraumatic , normocephalic, , moist mucus membrane,no stridor  Cardiovascular: S1S2, Regular, No M/R/G noted Lungs:  Tachypnea, diminished air entry bilaterally,rales noted Abdomen:  Distended, obese, round, firm, peritoneal dialysis catheter found Musculoskeletal:  +2 pitting edema noted Skin:  Warm, dry and intact  LABS:  BMET  Recent Labs Lab 09/21/16 2244  NA 136  K 5.9*  CL 101  CO2 22  BUN 89*  CREATININE 13.40*  GLUCOSE 158*    Electrolytes  Recent Labs Lab 09/21/16 2244  CALCIUM 7.0*    CBC  Recent Labs Lab 09/21/16 2244  WBC 11.6*  HGB 10.3*  HCT 31.2*  PLT 237    Coag's No results for input(s): APTT, INR in the last 168 hours.  Sepsis Markers No results for input(s): LATICACIDVEN, PROCALCITON, O2SATVEN in the last 168 hours.  ABG No results for input(s): PHART, PCO2ART, PO2ART in the last 168 hours.  Liver Enzymes  Recent Labs Lab 09/21/16 2244  AST 16  ALT 17  ALKPHOS 54  BILITOT 0.6  ALBUMIN 2.9*    Cardiac Enzymes  Recent Labs Lab 09/21/16 2244  TROPONINI 0.03*    Glucose  Recent Labs Lab 09/21/16 1259 09/21/16 2253  GLUCAP 74 194*    Imaging Dg Chest Portable 1 View  Result Date: 09/21/2016 CLINICAL DATA:  Dialysis catheter placement.  Dyspnea. EXAM: PORTABLE CHEST 1 VIEW COMPARISON:   None. FINDINGS: New dual-lumen right IJ dialysis catheter is noted with tip projecting over the proximal right atrium. The interstitial pulmonary edema persists. Trace pleural effusions bilaterally. No pneumothorax. Stable cardiomegaly with aortic atherosclerosis. No acute nor suspicious osseous abnormalities. IMPRESSION: Cardiomegaly with stable interstitial pulmonary edema. New right-sided IJ dialysis catheter tip in the proximal right atrium. No pneumothorax. Electronically Signed   By: Ashley Royalty M.D.   On: 09/21/2016 22:51     STUDIES:  07/17/16 ECHO>>Left ventricle: The cavity size was normal. There was severe  concentric hypertrophy. Systolic function was mildly reduced. The  estimated ejection fraction was in the range of 45% to 50%.  CULTURES: None  ANTIBIOTICS: None  SIGNIFICANT EVENTS: 3/1 Patient admitted to the ICU on BiPAP due to fluid overload requiring emergent dialysis.  LINES/TUBES: 3/1 Right  IJHD cath>>   ASSESSMENT / PLAN:  PULMONARY A: Acute respiratory Failure due to pulmonary edema secondary to ESRD Metabolic Acidosis P:   Continue BiPAP, wean as tolerated PRN bronchodilator Received NaHCO3 x1, Calcium gluconate X1 Continue Lasix  CARDIOVASCULAR A:  Congestive Heart Failure Hypertension Hypercholeterolemia P:  Continuous Telemetry Continue amlodipine/lisinopril/coreg/hydralazine Continue Pravastatin  RENAL A:  Acute On Chronic Kidney disease.  ESRD Hyperkalemia Hypoalbuminemia P:   Nephrology consulted, getting emergent dialysis Received d-50 x1 and 10 units of insulin X1  Rest per nephro recommendation Follow BMET Avoid Nephrotoxic drugs  GASTROINTESTINAL A:   No active issues P:   Will keep npo for now as the patient is in respiratory distress   HEMATOLOGIC A:   Anemia of the chronic disease. P:  Heparin for DVT prophylaxis Transfuse per usual guidelines  INFECTIOUS A:   No active issues P:   Monitor fever  curve Follow CBC  ENDOCRINE A:   Diabetes Melitus  Hypothyroidism P:   Blood glucose checks with SSI coverage Continue Synthroid  NEUROLOGIC A:   No active issues P:   Provide supportive care   FAMILY  - Updates: Husband at the bedside . Plan of care was discussed and all questions answered     Bethany Lee,AG-ACNP Pulmonary and [place]   09/21/2016, 11:51 PM  Pt seen and examined, agree with NP findings, assessment and plan as amended above. The patient was admitted with volume overload, with pulm edema after malfunctioning of PD catheter. Was placed on bipap overnight, currently she is awake and alert, vitals are stable, the patient has some scattered mild crackles, but doing well off bipap.  D/w nephro, plan to place patient on HD until PD catheter issues can be fixed.  Continue diuresis.  Bethany Lee, M.D.   Critical Care Attestation.  I have personally obtained a history, examined the patient, evaluated laboratory and imaging results, formulated the assessment and plan and placed orders. The Patient requires high complexity decision making for assessment and support, frequent evaluation and titration of therapies, application of advanced monitoring technologies and extensive interpretation of multiple databases. The patient has critical illness that could lead imminently to failure of 1 or more organ systems and requires the highest level of physician preparedness to intervene.  Critical Care Time devoted to patient care services described in this note is 30 minutes and is exclusive of time spent in procedures.

## 2016-09-21 NOTE — ED Triage Notes (Addendum)
Pt had dialysis catheter/perma cath placed to right chest today and has become shob since then sats 83% in triage.

## 2016-09-21 NOTE — ED Provider Notes (Addendum)
[facility] Emergency Department Provider Note        Time seen: ----------------------------------------- 10:46 PM on 09/21/2016 -----------------------------------------  L5 caveat: Review of systems and history is limited by dyspnea  I have reviewed the triage vital signs and the nursing notes.   HISTORY  Chief Complaint Shortness of Breath    HPI Bethany Lee is a 66 y.o. female who presents to ER for severe shortness of breath. Patient reportedly had a right-sided chest catheter placed today and has had progressive worsening shortness of breath. Evidently she has a history of peritoneal dialysis but the peritoneal dialysis catheter had moved so she she needed hemodialysis. She had a catheter placed today for dialysis but has not had any in several days. She complains of significant shortness of breath.   Past Medical History:  Diagnosis Date  . Anemia   . Chronic kidney disease    peritoneal dialysis  . Complication of anesthesia   . Diabetes mellitus without complication ([place])   . Dyspnea    doe  . Edema   . Hypercholesterolemia   . Hypertension   . Hypothyroidism   . Neuropathy ([place])   . PONV (postoperative nausea and vomiting)   . Shingles    3 weeks ago  . Tremors of nervous system    intermittent when taking gabapentin    Patient Active Problem List   Diagnosis Date Noted  . CAD (coronary artery disease) 09/07/2016  . Acute pulmonary edema ([place]) 07/16/2016  . ESRD on dialysis ([place]) 12/21/2015  . Peritoneal dialysis status ([place]) 12/21/2015  . Leg swelling 12/21/2015  . Anemia of chronic disease 12/21/2015  . Diabetes mellitus ([place]) 12/21/2015  . Hyperkalemia 12/21/2015  . 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 Anemia in chronic renal disease 10/04/2015  . Proteinuria 05/25/2015    Past Surgical History:  Procedure Laterality Date  . ABDOMINAL HYSTERECTOMY    . CAPD INSERTION N/A 11/25/2015   Procedure: LAPAROSCOPIC INSERTION  CONTINUOUS AMBULATORY PERITONEAL DIALYSIS  (CAPD) CATHETER;  Surgeon: Algernon Huxley, MD;  Location: [facility] ORS;  Service: Vascular;  Laterality: N/A;  . CAPD INSERTION N/A 01/06/2016   Procedure: LAPAROSCOPIC INSERTION CONTINUOUS AMBULATORY PERITONEAL DIALYSIS  (CAPD) CATHETER REVISION ;  Surgeon: Algernon Huxley, MD;  Location: [facility] ORS;  Service: Vascular;  Laterality: N/A;  . CATARACT EXTRACTION W/PHACO Right 05/25/2016   Procedure: CATARACT EXTRACTION PHACO AND INTRAOCULAR LENS PLACEMENT ([place]);  Surgeon: Eulogio Bear, MD;  Location: [facility] ORS;  Service: Ophthalmology;  Laterality: Right;  Lot # [id number] H [place]: 00:5.2 AP%: 11.3 CDE: 7.21  . CORONARY STENT INTERVENTION N/A 09/07/2016   Procedure: Coronary Stent Intervention;  Surgeon: Isaias Cowman, MD;  Location: [place] [facility];  Service: Cardiovascular;  Laterality: N/A;  . EYE SURGERY    . LEFT HEART CATH AND CORONARY ANGIOGRAPHY Left 09/07/2016   Procedure: Left Heart Cath and Coronary Angiography;  Surgeon: Isaias Cowman, MD;  Location: [place] [facility];  Service: Cardiovascular;  Laterality: Left;  . TUBAL LIGATION      Allergies Penicillins and Valacyclovir  Social History Social History  Substance Use Topics  . Smoking status: Former Research scientist (life sciences)  . Smokeless tobacco: Never Used  . Alcohol use No    Review of Systems Constitutional: Negative for fever. Cardiovascular: Negative for chest pain. Respiratory:Positive for shortness of breath   ____________________________________________   PHYSICAL EXAM:  VITAL SIGNS: ED Triage Vitals  Enc Vitals Group     BP 09/21/16 2232 (!) 162/104  Pulse Rate 09/21/16 2232 95     Resp 09/21/16 2232 18     Temp 09/21/16 2232 98.6 F (37 C)     Temp Source 09/21/16 2232 Oral     SpO2 09/21/16 2232 (!) 83 %     Weight 09/21/16 2232 154 lb 5.2 oz (70 kg)     Height --      Head Circumference --      Peak Flow --      Pain Score 09/21/16 2233 0     Pain Loc --       Pain Edu? --      Excl. in [place]? --     Constitutional: Alert and oriented. Moderate distress Eyes: Conjunctivae are normal. Normal extraocular movements. ENT   Head: Normocephalic and atraumatic.   Nose: No congestion/rhinnorhea.   Mouth/Throat: Mucous membranes are moist.   Neck: No stridor. Cardiovascular: Normal rate, regular rhythm. No murmurs, rubs, or gallops. Respiratory: Tachypnea with diminished breath sounds and rales bilaterally Gastrointestinal: Soft and nontender. Normal bowel sounds Musculoskeletal: Nontender with normal range of motion in all extremities. Pitting edema is noted Neurologic:  Normal speech and language. No gross focal neurologic deficits are appreciated.  Skin:  Skin is warm, dry and intact. No rash noted. Psychiatric: Mood and affect are normal. Speech and behavior are normal.  ____________________________________________  EKG: Interpreted by me. Sinus rhythm with a rate of 97 bpm, normal PR interval, normal QRS, long QT  ____________________________________________  ED COURSE:  Pertinent labs & imaging results that were available during my care of the patient were reviewed by me and considered in my medical decision making (see chart for details). Patient presents to ER with dyspnea likely volume overload. We will assess with labs, consult nephrology and anticipate hemodialysis.   Procedures ____________________________________________   LABS (pertinent positives/negatives)  Labs Reviewed  CBC WITH DIFFERENTIAL/PLATELET - Abnormal; Notable for the following:       Result Value   WBC 11.6 (*)    RBC 3.38 (*)    Hemoglobin 10.3 (*)    HCT 31.2 (*)    RDW 16.6 (*)    Neutro Abs 9.8 (*)    All other components within normal limits  COMPREHENSIVE METABOLIC PANEL - Abnormal; Notable for the following:    Potassium 5.9 (*)    Glucose, Bld 158 (*)    BUN 89 (*)    Creatinine, Ser 13.40 (*)    Calcium 7.0 (*)    Total Protein 6.2 (*)     Albumin 2.9 (*)    GFR calc non Af Amer 2 (*)    GFR calc Af Amer 3 (*)    All other components within normal limits  BLOOD GAS, VENOUS - Abnormal; Notable for the following:    pH, Ven 7.19 (*)    pO2, Ven 77.0 (*)    Acid-base deficit 7.8 (*)    All other components within normal limits  GLUCOSE, CAPILLARY - Abnormal; Notable for the following:    Glucose-Capillary 194 (*)    All other components within normal limits  TROPONIN I  BRAIN NATRIURETIC PEPTIDE   CRITICAL CARE Performed by: Earleen Newport   Total critical care time: 30 minutes  Critical care time was exclusive of separately billable procedures and treating other patients.  Critical care was necessary to treat or prevent imminent or life-threatening deterioration.  Critical care was time spent personally by me on the following activities: development of treatment plan with patient and/or  surrogate as well as nursing, discussions with consultants, evaluation of patient's response to treatment, examination of patient, obtaining history from patient or surrogate, ordering and performing treatments and interventions, ordering and review of laboratory studies, ordering and review of radiographic studies, pulse oximetry and re-evaluation of patient's condition.  RADIOLOGY Images were viewed by me  Chest x-ray Reveals edema and volume overload IMPRESSION: Cardiomegaly with stable interstitial pulmonary edema. New right-sided IJ dialysis catheter tip in the proximal right atrium. No pneumothorax. ____________________________________________  FINAL ASSESSMENT AND PLAN  Pulmonary edema, end-stage renal disease, hyperkalemia, hypocalcemia, acidemia  Plan: Patient with labs and imaging as dictated above. Patient presents to ER in distress from not having had dialysis. She is volume overloaded and will need emergent hemodialysis. She's been placed on BiPAP for respiratory distress.   Earleen Newport,  MD   Note: This note was generated in part or whole with voice recognition software. Voice recognition is usually quite accurate but there are transcription errors that can and very often do occur. I apologize for any typographical errors that were not detected and corrected.     Earleen Newport, MD 09/21/16 [place], MD 09/21/16 [id number]    Earleen Newport, MD 09/21/16 [phone]

## 2016-09-21 NOTE — ED Notes (Signed)
Patient brought back from triage in wheelchair. Upon moving to bed, patient in tripod position with accessory muscle use noted. Patient O2 saturation 80% on room air. Placed on 15L non-rebreather mask and Dr. Jimmye Norman notified. Respiratory therapist called for bipap

## 2016-09-21 NOTE — Op Note (Signed)
OPERATIVE NOTE    PRE-OPERATIVE DIAGNOSIS: 1. ESRD 2. Non-functional PD catheter requiring revision in the OR at a later date  POST-OPERATIVE DIAGNOSIS: same as above  PROCEDURE: 1. Ultrasound guidance for vascular access to the right internal jugular vein 2. Fluoroscopic guidance for placement of catheter 3. Placement of a 19 cm tip to cuff tunneled hemodialysis catheter via the right internal jugular vein  SURGEON: Leotis Pain, MD  ANESTHESIA:  Local with Moderate conscious sedation for approximately 20 minutes using 3 mg of Versed and 100 mcg of Fentanyl  ESTIMATED BLOOD LOSS: 15 cc  FLUORO TIME: less than one minute  CONTRAST: none  FINDING(S): 1.  Patent right internal jugular vein  SPECIMEN(S):  None  INDICATIONS:   Bethany Lee is a 66 y.o.female who presents with ESRD and a peritoneal dialysis catheter that is not working and will require surgical revision.  The patient needs long term dialysis access for their ESRD, and a Permcath is necessary.  Risks and benefits are discussed and informed consent is obtained.    DESCRIPTION: After obtaining full informed written consent, the patient was brought back to the vascular suited. The patient's right neck and chest were sterilely prepped and draped in a sterile surgical field was created. Moderate conscious sedation was administered during a face to face encounter with the patient throughout the procedure with my supervision of the RN administering medicines and monitoring the patient's vital signs, pulse oximetry, telemetry and mental status throughout from the start of the procedure until the patient was taken to the recovery room.  The right internal jugular vein was visualized with ultrasound and found to be patent. It was then accessed under direct ultrasound guidance and a permanent image was recorded. A wire was placed. After skin nick and dilatation, the peel-away sheath was placed over the wire. I then turned my  attention to an area under the clavicle. Approximately 1-2 fingerbreadths below the clavicle a small counterincision was created and tunneled from the subclavicular incision to the access site. Using fluoroscopic guidance, a 19 centimeter tip to cuff tunneled hemodialysis catheter was selected, and tunneled from the subclavicular incision to the access site. It was then placed through the peel-away sheath and the peel-away sheath was removed. Using fluoroscopic guidance the catheter tips were parked in the right atrium. The appropriate distal connectors were placed. It withdrew blood well and flushed easily with heparinized saline and a concentrated heparin solution was then placed. It was secured to the chest wall with 2 Prolene sutures. The access incision was closed single 4-0 Monocryl. A 4-0 Monocryl pursestring suture was placed around the exit site. Sterile dressings were placed. The patient tolerated the procedure well and was taken to the recovery room in stable condition.  COMPLICATIONS: None  CONDITION: Stable  Leotis Pain  05/01/2016, 12:56 PM   This note was created with Dragon Medical transcription system. Any errors in dictation are purely unintentional.

## 2016-09-22 ENCOUNTER — Encounter: Payer: Self-pay | Admitting: Vascular Surgery

## 2016-09-22 DIAGNOSIS — Z992 Dependence on renal dialysis: Secondary | ICD-10-CM | POA: Diagnosis not present

## 2016-09-22 DIAGNOSIS — R0603 Acute respiratory distress: Secondary | ICD-10-CM

## 2016-09-22 DIAGNOSIS — N186 End stage renal disease: Secondary | ICD-10-CM | POA: Diagnosis not present

## 2016-09-22 LAB — BASIC METABOLIC PANEL
Anion gap: 12 (ref 5–15)
BUN: 93 mg/dL — ABNORMAL HIGH (ref 6–20)
CHLORIDE: 101 mmol/L (ref 101–111)
CO2: 24 mmol/L (ref 22–32)
Calcium: 6.8 mg/dL — ABNORMAL LOW (ref 8.9–10.3)
Creatinine, Ser: 14.17 mg/dL — ABNORMAL HIGH (ref 0.44–1.00)
GFR calc non Af Amer: 2 mL/min — ABNORMAL LOW (ref >60)
GFR, EST AFRICAN AMERICAN: 3 mL/min — AB (ref >60)
Glucose, Bld: 137 mg/dL — ABNORMAL HIGH (ref 65–99)
POTASSIUM: 5.2 mmol/L — AB (ref 3.5–5.1)
SODIUM: 137 mmol/L (ref 135–145)

## 2016-09-22 LAB — CBC
HEMATOCRIT: 27.4 % — AB (ref 35.0–47.0)
Hemoglobin: 9.2 g/dL — ABNORMAL LOW (ref 12.0–16.0)
MCH: 30.9 pg (ref 26.0–34.0)
MCHC: 33.7 g/dL (ref 32.0–36.0)
MCV: 91.8 fL (ref 80.0–100.0)
Platelets: 184 10*3/uL (ref 150–440)
RBC: 2.98 MIL/uL — AB (ref 3.80–5.20)
RDW: 16.1 % — ABNORMAL HIGH (ref 11.5–14.5)
WBC: 15.3 10*3/uL — ABNORMAL HIGH (ref 3.6–11.0)

## 2016-09-22 LAB — GLUCOSE, CAPILLARY
GLUCOSE-CAPILLARY: 171 mg/dL — AB (ref 65–99)
Glucose-Capillary: 131 mg/dL — ABNORMAL HIGH (ref 65–99)
Glucose-Capillary: 159 mg/dL — ABNORMAL HIGH (ref 65–99)
Glucose-Capillary: 196 mg/dL — ABNORMAL HIGH (ref 65–99)
Glucose-Capillary: 83 mg/dL (ref 65–99)
Glucose-Capillary: 88 mg/dL (ref 65–99)

## 2016-09-22 LAB — PHOSPHORUS: Phosphorus: 6.2 mg/dL — ABNORMAL HIGH (ref 2.5–4.6)

## 2016-09-22 LAB — MRSA PCR SCREENING: MRSA BY PCR: NEGATIVE

## 2016-09-22 LAB — MAGNESIUM: Magnesium: 1.8 mg/dL (ref 1.7–2.4)

## 2016-09-22 MED ORDER — IPRATROPIUM-ALBUTEROL 0.5-2.5 (3) MG/3ML IN SOLN: 3.0000 mL | RESPIRATORY_TRACT | Status: DC | PRN

## 2016-09-22 MED ORDER — INSULIN ASPART 100 UNIT/ML ~~LOC~~ SOLN
2.0000 [IU] | SUBCUTANEOUS | Status: DC
  Administered 2016-09-22: 4 [IU] via SUBCUTANEOUS
  Filled 2016-09-22: qty 4

## 2016-09-22 MED ORDER — LEVOTHYROXINE SODIUM 75 MCG PO TABS
75.0000 ug | ORAL_TABLET | Freq: Every day | ORAL | Status: DC
  Administered 2016-09-22 – 2016-09-23 (×2): 75 ug via ORAL
  Filled 2016-09-22 (×2): qty 1

## 2016-09-22 MED ORDER — EPOETIN ALFA 10000 UNIT/ML IJ SOLN
10000.0000 [IU] | Freq: Once | INTRAMUSCULAR | Status: AC
  Administered 2016-09-22: 10000 [IU] via INTRAVENOUS

## 2016-09-22 MED ORDER — FUROSEMIDE 80 MG PO TABS
80.0000 mg | ORAL_TABLET | Freq: Every day | ORAL | Status: DC
  Administered 2016-09-22 – 2016-09-23 (×2): 80 mg via ORAL
  Filled 2016-09-22: qty 2
  Filled 2016-09-22: qty 1

## 2016-09-22 NOTE — Progress Notes (Signed)
Post Hemodialysis:  Treatment ended 10 minutes early due to patient complaints of right leg cramping,after rinseback cramping improved about 32minutes later.2.7net fluid removed.CVC capped/clamped and lumens wrapped with gauze.

## 2016-09-22 NOTE — Care Management Note (Addendum)
Case Management Note  Patient Details  Name: Bethany Lee MRN: [id number] Date of Birth: [birth year]  Subjective/Objective:                  Met with patient and her husband to discuss discharge planning needs as patient was recently discharged from [facility]. She is from home where she was doing peritoneal dialysis and access was lost per patient. She states she plans for hemodiaylsis for 1-2 weeks at discharge with [address] [facility], [facility], [facility]. She has never used home health services per husband and concerned with cost. She states she has a rolling walker and cane available but typically she is independent at home and able to drive. Her PCP is Dr. Ginette Pitman with [facility]. She uses Product[facility]process development scientist on [place] for medications and denies difficulty obtaining medications. Her husband mentioned that he is seeking Medicaid to assist with $1,000/month dialysis costs. O2 is acute.   Action/Plan: Elvera Bicker dialysis liaison with [facility] updated. [facility] referral made after talking with husband/patient. Home health list delivered to patient for review.  Expected Discharge Date:                  Expected Discharge Plan:     In-House Referral:     Discharge planning Services  CM Consult [facility] referral)  Post Acute Care Choice:  Home Health, Durable Medical Equipment Choice offered to:  Patient, Spouse  DME Arranged:    DME Agency:     HH Arranged:    HH Agency:     Status of Service:  In process, will continue to follow  If discussed at Long Length of Stay Meetings, dates discussed:    Additional Comments:  Marshell Garfinkel, RN 09/22/2016, 11:11 AM

## 2016-09-22 NOTE — Progress Notes (Signed)
Post hd tx 

## 2016-09-22 NOTE — Care Management (Signed)
RNCM has asked Jancie with [facility] to review for [facility] appropriateness. Peritoneal dialysis patient currently doing back up hemo treatment. RNCM to follow.

## 2016-09-22 NOTE — H&P (Signed)
Report given to [facility] on 2. Pt and spouse at bedside aware of the transfer, vs wnl as charted.

## 2016-09-22 NOTE — Progress Notes (Signed)
Hemodialysis treatment started without complications.Peritoneal dialysis patient currently doing back up hemo treatment.

## 2016-09-22 NOTE — Progress Notes (Signed)
SNF, Home Health and Non-Emergent EMS Transport Benefits:  Number called: [phone] Rep: Aniceto Boss Reference Number: [id number]  [place] HMO plan active as of 07/24/16 with no deductible.  Out of pocket max is $5900, of which $1732.29 met so far.  In-network SNF: $0 copay for days 1-20 and a $167 copay per day for days 21 -100.  Limited to 100 days each benefit period.  Josem Kaufmann is required: [phone].    In-network Home Health: covered at 100% of the allowable amount for services.  Visit limit determined by medical necessity. Josem Kaufmann may be required depending on the service code. Check auth status at [phone].   Non-emergent EMS transport: $265 copay per date of service.  Josem Kaufmann is not required for service codes [phone] or [phone].

## 2016-09-22 NOTE — Progress Notes (Signed)
PRE DIALYSIS ASSESSMENT 

## 2016-09-22 NOTE — Progress Notes (Signed)
Hemodialysis ended 10 minutes early due to patient complaint of cramping..Dr.Kolluru aware

## 2016-09-22 NOTE — Progress Notes (Signed)
HD COMPLETED  

## 2016-09-22 NOTE — Progress Notes (Signed)
POST DIALYSIS ASSESSMENT 

## 2016-09-22 NOTE — Progress Notes (Signed)
Hemodialysis: Awaiting patient transfer to ICU from emergency room for hemodialysis to start.

## 2016-09-22 NOTE — Progress Notes (Signed)
[facility]  ROUNDING NOTE   Subjective:   Bethany Lee admitted to [facility] on 09/21/2016 for End stage renal disease [facility]) [N18.6] Acute respiratory distress [R06.03] Acute pulmonary edema ([place]) [J81.0]  Patient was seen in the office by me, Dr. Juleen China on 2/28 where she was having difficulty with peritoneal dialysis. She has not had full treatment since Monday. Since then she has been having slow PD drainage. An abdominal x-ray was done showing migration of the PD catheter.  Vascular surgery, Dr. Lucky Cowboy, was then contacted where it was decided due to patient's volume status and inadequate peritoneal dialysis for patient to get back up hemodialysis for a week or two. Next week plan on revision of PD catheter.   Patient did peritoneal dialysis Wednesday with minimal success. She received RIJ permcath by Dr. Lucky Cowboy yesterday.   She was then admitted later in the evening with volume overload and pulmonary edema.   Patient required emergent hemodialysis last night. UF of 2.768 litres 2 K bath  Objective:  Vital signs in last 24 hours:  Temp:  [96.6 F (35.9 C)-98.9 F (37.2 C)] 98.6 F (37 C) (03/02 0800) Pulse Rate:  [80-105] 90 (03/02 0700) Resp:  [14-37] 18 (03/02 0700) BP: (93-180)/(60-156) 170/85 (03/02 0802) SpO2:  [83 %-100 %] 98 % (03/02 0700) FiO2 (%):  [100 %] 100 % (03/02 0002) Weight:  [60.8 kg (134 lb 0.6 oz)-70.9 kg (156 lb 4.9 oz)] 60.8 kg (134 lb 0.6 oz) (03/02 0500)  Weight change:  Filed Weights   09/22/16 0002 09/22/16 0100 09/22/16 0500  Weight: 70.9 kg (156 lb 4.9 oz) 70.9 kg (156 lb 4.9 oz) 60.8 kg (134 lb 0.6 oz)    Intake/Output: I/O last 3 completed shifts: In: -  Out: 2768 [Other:2768]   Intake/Output this shift:  No intake/output data recorded.  Physical Exam: General: NAD, laying in bed  Head: Normocephalic, atraumatic. Moist oral mucosal membranes  Eyes: Anicteric, PERRL  Neck: Supple, trachea midline  Lungs:  Bilateral diminished  at bases, 6 Litres [place]  Heart: Regular rate and rhythm  Abdomen:  Soft, nontender,   Extremities:  trace peripheral edema.  Neurologic: Nonfocal, moving all four extremities  Skin: No lesions  Access: PD catheter, Right IJ permcath 3/1    Basic Metabolic Panel:  Recent Labs Lab 09/21/16 2244 09/22/16 0116  NA 136 137  K 5.9* 5.2*  CL 101 101  CO2 22 24  GLUCOSE 158* 137*  BUN 89* 93*  CREATININE 13.40* 14.17*  CALCIUM 7.0* 6.8*  MG  --  1.8  PHOS  --  6.2*    Liver Function Tests:  Recent Labs Lab 09/21/16 2244  AST 16  ALT 17  ALKPHOS 54  BILITOT 0.6  PROT 6.2*  ALBUMIN 2.9*   No results for input(s): LIPASE, AMYLASE in the last 168 hours. No results for input(s): AMMONIA in the last 168 hours.  CBC:  Recent Labs Lab 09/21/16 2244 09/22/16 0116  WBC 11.6* 15.3*  NEUTROABS 9.8*  --   HGB 10.3* 9.2*  HCT 31.2* 27.4*  MCV 92.5 91.8  PLT 237 184    Cardiac Enzymes:  Recent Labs Lab 09/21/16 2244  TROPONINI 0.03*    BNP: Invalid input(s): POCBNP  CBG:  Recent Labs Lab 09/21/16 1259 09/21/16 2253 09/22/16 0042 09/22/16 0331 09/22/16 0734  GLUCAP 74 194* 171* 88 47    Microbiology: Results for orders placed or performed during the hospital encounter of 09/21/16  MRSA PCR Screening  Status: None   Collection Time: 09/22/16 12:40 AM  Result Value Ref Range Status   MRSA by PCR NEGATIVE NEGATIVE Final    Comment:        The GeneXpert MRSA Assay (FDA approved for NASAL specimens only), is one component of a comprehensive MRSA colonization surveillance program. It is not intended to diagnose MRSA infection nor to guide or monitor treatment for MRSA infections.     Coagulation Studies: No results for input(s): LABPROT, INR in the last 72 hours.  Urinalysis: No results for input(s): COLORURINE, LABSPEC, PHURINE, GLUCOSEU, HGBUR, BILIRUBINUR, KETONESUR, PROTEINUR, UROBILINOGEN, NITRITE, LEUKOCYTESUR in the last 72  hours.  Invalid input(s): APPERANCEUR    Imaging: Dg Abd 1 View  Result Date: 09/20/2016 CLINICAL DATA:  Peritoneal dialysis catheter, poorly functioning. EXAM: ABDOMEN - 1 VIEW COMPARISON:  01/06/2016.  CT 05/04/2016. FINDINGS: Peritoneal dialysis catheter enters from the left an extends to the left upper quadrant, showing a coiled configuration. No evidence of kink. Left upper quadrant a somewhat unusual positioned 4 tests. Bowel gas pattern is normal. No significant bone finding. Previously, catheter has been in the right abdomen/pelvis. IMPRESSION: Dialysis catheter tip is in the left upper quadrant, in a coiled configuration. No evidence of kink. Left upper quadrant not the most common location for catheter position. Previously this has been in the right abdomen/ pelvis in this patient. Electronically Signed   By: Nelson Chimes M.D.   On: 09/20/2016 13:14   Dg Chest Portable 1 View  Result Date: 09/21/2016 CLINICAL DATA:  Dialysis catheter placement.  Dyspnea. EXAM: PORTABLE CHEST 1 VIEW COMPARISON:  None. FINDINGS: New dual-lumen right IJ dialysis catheter is noted with tip projecting over the proximal right atrium. The interstitial pulmonary edema persists. Trace pleural effusions bilaterally. No pneumothorax. Stable cardiomegaly with aortic atherosclerosis. No acute nor suspicious osseous abnormalities. IMPRESSION: Cardiomegaly with stable interstitial pulmonary edema. New right-sided IJ dialysis catheter tip in the proximal right atrium. No pneumothorax. Electronically Signed   By: Ashley Royalty M.D.   On: 09/21/2016 22:51     Medications:    . amLODipine  5 mg Oral Daily  . aspirin      . aspirin EC  81 mg Oral Daily  . calcium acetate  667 mg Oral TID WC  . carvedilol  12.5 mg Oral BID WC  . clopidogrel  75 mg Oral Q breakfast  . furosemide  80 mg Oral Daily  . heparin  5,000 Units Subcutaneous Q8H  . hydrALAZINE      . hydrALAZINE  50 mg Oral BID  . insulin aspart  2-6 Units  Subcutaneous Q4H  . levothyroxine  75 mcg Oral QAC breakfast  . lisinopril  5 mg Oral Daily  . pravastatin  20 mg Oral q1800  . sevelamer carbonate  2.4 g Oral TID WC  . vitamin B-12  1,000 mcg Oral Daily   acetaminophen, ipratropium-albuterol, ondansetron (ZOFRAN) IV  Assessment/ Plan:  Bethany Lee is a 66 year old Asian ([place]) female with diabetes mellitus type 2, diabetic neuropathy, diabetic nephropathy (biopsy proven), anemia, hypertension, hyperlipidemia   Peritoneal dialysis [place]. Dr. Juleen China Prescription: 6 nights a week. CAPD 10 hours 5 exchanges 1740mL fills 2.5% dextrose   1. ESRD with hyperkalemia: kt/v barely at goal from last week 1.73. Patient getting inadequate peritoneal dialysis due to catheter migration/displacement.  Emergent hemodialysis last night. UF of 2.7 litres through permcath. 2 K bath - Plan on dialysis again today.   2. Hypertension: not at  goal. Volume driven. Not well controlled lately.  - home regimen of furosemide, metolazone, amlodipine, hydralazine, and carvedilol  3. Diabetes Mellitus type II: Insulin dependent.: at goal, Hemoglobin A1c 5.9%   4. AOKD: with iron deficiency: hemoglobin 9.2. Missed outpatient epo due to current illness. - epo with hemodialysis treatment TTS  5. Secondary Hyperparathyroidism with hyperphosphatemia: phos 6.2 - Currently ordered sevelamer and calcium acetate   LOS: [place], [place] 3/2/201810:07 AM

## 2016-09-22 NOTE — ED Notes (Signed)
meds given.  Pt waiting on icu admission bed for dialysis tonight.  Sinus on monitor at 100.  Pt on bipap.   Pt unable to take po meds at this time.  Family at bedside.

## 2016-09-22 NOTE — ED Notes (Signed)
Report called to misty rn ccu nurse.

## 2016-09-22 NOTE — Consult Note (Signed)
   [facility] CM Inpatient Consult   09/22/2016  Bethany Lee 04-05-1951 [id number]     Received referral from inpatient RNCM, Levada Dy, for [place] [facility] program services. Spoke with patient's nurse prior to calling into room. Called into patient's room and Bethany Lee asked Probation officer to call back to speak with her husband. Spoke with inpatient RNCM who indicates Bethany Lee is best to speak with about [place] Management services.   Telephone call made to Bethany Lee, patient's husband, to discuss and offer [facility] Care Management program. Bethany Lee states they have good follow up with the team at [facility]. States they meet with the team once to twice a month. Denies needing any additional services thru [place] [facility] services. Telephone call made to [place], inpatient [facility], to make [place] [facility] aware that [place] Management services are declined.     Marthenia Rolling, MSN-Ed, RN,BSN [facility] Liaison [phone]

## 2016-09-22 NOTE — Progress Notes (Signed)
HD STARTED  

## 2016-09-22 NOTE — Progress Notes (Signed)
ADMISSION DATE:  09/21/2016 CONSULTATION DATE:  09/21/16  REFERRING MD:  Dr. Jimmye Norman  CHIEF COMPLAINT: Shortness of Breath  HISTORY OF PRESENT ILLNESS:   Bethany Lee is a 66 yo female with PMH significant for Anemia, Chronic kidney Disease, Diabetes Mellitus, Hypercholesterolemia, Hypertension and Hypothyroidism. Patient has a history of peritoneal dialysis but apparently her peritoneal catheter was non-functional since Monday(2/26). Therefore she did not get peritoneal dialysis.  Patient reportedly had a right sided chest catheter placed today.  Patient presents to ED with severe shortness of breath due to pulmonary edema.  Therefore was placed on BiPAP and admitted to the ICU for emergent dialysis.  PAST MEDICAL HISTORY :  She  has a past medical history of Anemia; Chronic kidney disease; Complication of anesthesia; Diabetes mellitus without complication ([place]); Dyspnea; Edema; Hypercholesterolemia; Hypertension; Hypothyroidism; Neuropathy ([place]); PONV (postoperative nausea and vomiting); Shingles; and Tremors of nervous system.  REVIEW OF SYSTEMS:  Positives in BOLD  Gen: Denies fever, chills, weight change, fatigue, night sweats HEENT: Denies blurred vision, double vision, hearing loss, tinnitus, sinus congestion, rhinorrhea, sore throat, neck stiffness, dysphagia PULM: shortness of breath, cough, sputum production, hemoptysis, wheezing CV: chest pain, edema, orthopnea, paroxysmal nocturnal dyspnea, palpitations GI: abdominal pain, nausea, vomiting, chronic diarrhea, hematochezia, melena, constipation, change in bowel habits GU: Denies dysuria, hematuria, polyuria, oliguria, urethral discharge Endocrine: Denies hot or cold intolerance, polyuria, polyphagia or appetite change Derm: Denies rash, dry skin, scaling or peeling skin change Heme: Denies easy bruising, bleeding, bleeding gums Neuro: Denies headache, numbness, weakness, slurred speech, loss of memory or  consciousness  SUBJECTIVE:  Pt currently on 3L O2 via nasal canula states her breathing has improved.  VITAL SIGNS: BP (!) 170/139   Pulse (!) 105   Temp 98.6 F (37 C) (Oral)   Resp (!) 37   Wt 70 kg (154 lb 5.2 oz)   SpO2 100%   BMI 24.91 kg/m   HEMODYNAMICS:  INTAKE / OUTPUT: No intake/output data recorded.  PHYSICAL EXAMINATION: General: Well developed, well nourished female  Neuro:  Alert and oriented, follows command HEENT:  Atraumatic , normocephalic, moist mucus membrane, supple, no JVD Cardiovascular: S1S2, Regular, No M/R/G  Lungs: Scattered faint crackles, even, non labored  Abdomen: Mildly distended, taut, left quadrant peritoneal dialysis catheter present dressing dry and intact Musculoskeletal: 1+ generalized edema  Skin:  Warm, dry and intact  LABS: Reviewed   Imaging Dg Chest Portable 1 View Result Date: 09/21/2016 CLINICAL DATA:  Dialysis catheter placement.  Dyspnea. EXAM: PORTABLE CHEST 1 VIEW COMPARISON:  None. FINDINGS: New dual-lumen right IJ dialysis catheter is noted with tip projecting over the proximal right atrium. The interstitial pulmonary edema persists. Trace pleural effusions bilaterally. No pneumothorax. Stable cardiomegaly with aortic atherosclerosis. No acute nor suspicious osseous abnormalities. IMPRESSION: Cardiomegaly with stable interstitial pulmonary edema. New right-sided IJ dialysis catheter tip in the proximal right atrium. No pneumothorax. Electronically Signed By: Ashley Royalty M.D.   On: 09/21/2016 22:51   STUDIES:  Echo 12/25>>Left ventricle: The cavity size was normal. There was severeconcentric hypertrophy. Systolic function was mildly reduced. Theestimated ejection fraction was in the range of 45% to 50%.  CULTURES: Blood x2 03/2>>  ANTIBIOTICS: None  SIGNIFICANT EVENTS: 3/1-Patient admitted to the ICU on BiPAP due to acute respiratory failure secondary to pulmonary edema due to inadequate peritoneal dialysis  secondary to malfunctioning of PD Catheter requiring emergent hemodialysis   LINES/TUBES: 3/1 Right  IJHD  Permcath>>  ASSESSMENT / PLAN:  PULMONARY A: Acute  respiratory failure secondary to pulmonary edema  Metabolic Acidosis P:   Supplemental O2 to maintain O2 sats >92% PRN bronchodilator therapy Continue lasix  CARDIOVASCULAR A:  Chronic Diastolic Congestive Heart Failure Hypertension Hypercholeterolemia P:  Continuous Telemetry Continue amlodipine/lisinopril/coreg/hydralazine Continue Pravastatin  RENAL A:  Acute On Chronic Kidney disease  ESRD Hyperkalemia-improving  Hypoalbuminemia P:   Nephrology consulted, hemodialysis per recommendations  Follow BMET Avoid Nephrotoxic drugs  GASTROINTESTINAL A:   No active issues P:   Continue current diet   HEMATOLOGIC A:   Anemia of chronic disease P:  Heparin for VTE prophylaxis Manage s/sx of bleeding Transfuse for hgb <7  INFECTIOUS A:   No active issues P:   Trend WBC and monitor fever curve   ENDOCRINE A:   Diabetes Melitus  Hypothyroidism P:   Blood glucose checks with SSI coverage Continue Synthroid  NEUROLOGIC A:   No active issues P:   Provide supportive care   FAMILY  - Updates: No family at bedside to update 09/22/16 -If pt remains stable later today will transfer pt out of ICU and transfer service to hospitalist.  Marda Stalker, [place] Pager [phone] (please enter 7 digits) PCCM Consult Pager [phone] (please enter 7 digits)

## 2016-09-23 DIAGNOSIS — Z992 Dependence on renal dialysis: Secondary | ICD-10-CM | POA: Diagnosis not present

## 2016-09-23 DIAGNOSIS — N186 End stage renal disease: Secondary | ICD-10-CM | POA: Diagnosis not present

## 2016-09-23 LAB — RENAL FUNCTION PANEL
Albumin: 2.6 g/dL — ABNORMAL LOW (ref 3.5–5.0)
Anion gap: 9 (ref 5–15)
BUN: 30 mg/dL — ABNORMAL HIGH (ref 6–20)
CALCIUM: 7.3 mg/dL — AB (ref 8.9–10.3)
CO2: 31 mmol/L (ref 22–32)
Chloride: 96 mmol/L — ABNORMAL LOW (ref 101–111)
Creatinine, Ser: 5.52 mg/dL — ABNORMAL HIGH (ref 0.44–1.00)
GFR calc non Af Amer: 7 mL/min — ABNORMAL LOW (ref >60)
GFR, EST AFRICAN AMERICAN: 8 mL/min — AB (ref >60)
Glucose, Bld: 151 mg/dL — ABNORMAL HIGH (ref 65–99)
PHOSPHORUS: 3.2 mg/dL (ref 2.5–4.6)
Potassium: 4.2 mmol/L (ref 3.5–5.1)
SODIUM: 136 mmol/L (ref 135–145)

## 2016-09-23 LAB — GLUCOSE, CAPILLARY
GLUCOSE-CAPILLARY: 99 mg/dL (ref 65–99)
Glucose-Capillary: 95 mg/dL (ref 65–99)
Glucose-Capillary: 97 mg/dL (ref 65–99)

## 2016-09-23 LAB — CBC
HCT: 25.8 % — ABNORMAL LOW (ref 35.0–47.0)
HEMOGLOBIN: 8.7 g/dL — AB (ref 12.0–16.0)
MCH: 31 pg (ref 26.0–34.0)
MCHC: 33.8 g/dL (ref 32.0–36.0)
MCV: 91.7 fL (ref 80.0–100.0)
Platelets: 154 10*3/uL (ref 150–440)
RBC: 2.82 MIL/uL — AB (ref 3.80–5.20)
RDW: 16.4 % — ABNORMAL HIGH (ref 11.5–14.5)
WBC: 6.8 10*3/uL (ref 3.6–11.0)

## 2016-09-23 LAB — HEPATITIS B SURFACE ANTIGEN: HEP B S AG: NEGATIVE

## 2016-09-23 LAB — HEPATITIS B SURFACE ANTIBODY, QUANTITATIVE: HEPATITIS B-POST: 651.7 m[IU]/mL

## 2016-09-23 NOTE — Care Management Note (Signed)
Case Management Note  Patient Details  Name: Bethany Lee MRN: [id number] Date of Birth: February 16, 1951  Subjective/Objective:        02 sats in mid 7's while ambulating around the unit, no new home oxygen needed.             Action/Plan:   Expected Discharge Date:  09/23/16               Expected Discharge Plan:     In-House Referral:     Discharge planning Services  CM Consult [facility] referral)  Post Acute Care Choice:  Home Health, Durable Medical Equipment Choice offered to:  Patient, Spouse  DME Arranged:    DME Agency:     HH Arranged:    [place] Agency:     Status of Service:  In process, will continue to follow  If discussed at Long Length of Stay Meetings, dates discussed:    Additional Comments:  Bethany Lee A, RN 09/23/2016, 3:56 PM

## 2016-09-23 NOTE — Progress Notes (Signed)
Post hd assessment 

## 2016-09-23 NOTE — Progress Notes (Signed)
Pre hd info 

## 2016-09-23 NOTE — Progress Notes (Signed)
Discharged patient home with husband in stable condition, reviewed discharge instructions including how to access my chart.

## 2016-09-23 NOTE — Progress Notes (Signed)
[place] at [facility]                                                                                                                                                                                  Patient Demographics   Bethany Lee, is a 66 y.o. female, DOB - 1950-08-30, DEY:[id number]  Admit date - 09/21/2016   Admitting Physician Laverle Hobby, MD  Outpatient Primary MD for the patient is Tracie Harrier, MD   LOS - 2  Subjective: Patient admitted with acute respiratory failure due to her peritonial dialysis catheter not working Philbert Riser to the call from the nephrologist stating that patient wanted to go home. She still on 3 L of oxygen She initially refused her hemodialysis here in the hospital but now agreeable She states that she feels fine   Review of Systems:   CONSTITUTIONAL: No documented fever. No fatigue, weakness. No weight gain, no weight loss.  EYES: No blurry or double vision.  ENT: No tinnitus. No postnasal drip. No redness of the oropharynx.  RESPIRATORY: No cough, no wheeze, no hemoptysis. No dyspnea.  CARDIOVASCULAR: No chest pain. No orthopnea. No palpitations. No syncope.  GASTROINTESTINAL: No nausea, no vomiting or diarrhea. No abdominal pain. No melena or hematochezia.  GENITOURINARY: No dysuria or hematuria.  ENDOCRINE: No polyuria or nocturia. No heat or cold intolerance.  HEMATOLOGY: No anemia. No bruising. No bleeding.  INTEGUMENTARY: No rashes. No lesions.  MUSCULOSKELETAL: No arthritis. No swelling. No gout.  NEUROLOGIC: No numbness, tingling, or ataxia. No seizure-type activity.  PSYCHIATRIC: No anxiety. No insomnia. No ADD.    Vitals:   Vitals:   09/23/16 0748 09/23/16 1010 09/23/16 1020 09/23/16 1050  BP: (!) 166/54 (!) 135/59 (!) 155/73 127/60  Pulse: 86 78 79 84  Resp: 16 14 18 19   Temp: 98.4 F (36.9 C) 98.7 F (37.1 C)    TempSrc: Oral Oral    SpO2: 100% 100% 100% 100%  Weight:  150 lb 5.7 oz (68.2  kg)    Height:        Wt Readings from Last 3 Encounters:  09/23/16 150 lb 5.7 oz (68.2 kg)  09/21/16 148 lb (67.1 kg)  09/07/16 148 lb (67.1 kg)     Intake/Output Summary (Last 24 hours) at 09/23/16 1118 Last data filed at 09/23/16 0944  Gross per 24 hour  Intake              720 ml  Output             2705 ml  Net            -1985 ml    Physical Exam:  GENERAL: Pleasant-appearing in no apparent distress.  HEAD, EYES, EARS, NOSE AND THROAT: Atraumatic, normocephalic. Extraocular muscles are intact. Pupils equal and reactive to light. Sclerae anicteric. No conjunctival injection. No oro-pharyngeal erythema.  NECK: Supple. There is no jugular venous distention. No bruits, no lymphadenopathy, no thyromegaly.  HEART: Regular rate and rhythm,. No murmurs, no rubs, no clicks.  LUNGS: Bilateral crackles at the bases no accesory muscle usage  ABDOMEN: Soft, flat, nontender, nondistended. Has good bowel sounds. No hepatosplenomegaly appreciated.  EXTREMITIES: No evidence of any cyanosis, clubbing, or peripheral edema.  +2 pedal and radial pulses bilaterally.  NEUROLOGIC: The patient is alert, awake, and oriented x3 with no focal motor or sensory deficits appreciated bilaterally.  SKIN: Moist and warm with no rashes appreciated.  Psych: Not anxious, depressed LN: No inguinal LN enlargement    Antibiotics   Anti-infectives    None      Medications   Scheduled Meds: . amLODipine  5 mg Oral Daily  . aspirin EC  81 mg Oral Daily  . calcium acetate  667 mg Oral TID WC  . carvedilol  12.5 mg Oral BID WC  . clopidogrel  75 mg Oral Q breakfast  . furosemide  80 mg Oral Daily  . heparin  5,000 Units Subcutaneous Q8H  . hydrALAZINE  50 mg Oral BID  . levothyroxine  75 mcg Oral QAC breakfast  . lisinopril  5 mg Oral Daily  . pravastatin  20 mg Oral q1800  . sevelamer carbonate  2.4 g Oral TID WC  . vitamin B-12  1,000 mcg Oral Daily   Continuous Infusions: PRN  Meds:.acetaminophen, ipratropium-albuterol, ondansetron (ZOFRAN) IV   Data Review:   Micro Results Recent Results (from the past 240 hour(s))  MRSA PCR Screening     Status: None   Collection Time: 09/22/16 12:40 AM  Result Value Ref Range Status   MRSA by PCR NEGATIVE NEGATIVE Final    Comment:        The GeneXpert MRSA Assay (FDA approved for NASAL specimens only), is one component of a comprehensive MRSA colonization surveillance program. It is not intended to diagnose MRSA infection nor to guide or monitor treatment for MRSA infections.   CULTURE, BLOOD (ROUTINE X 2) w Reflex to ID Panel     Status: None (Preliminary result)   Collection Time: 09/22/16  8:20 AM  Result Value Ref Range Status   Specimen Description BLOOD RIGHT ASSIST CONTROL  Final   Special Requests BOTTLES DRAWN AEROBIC AND ANAEROBIC BCAV  Final   Culture NO GROWTH < 24 HOURS  Final   Report Status PENDING  Incomplete  CULTURE, BLOOD (ROUTINE X 2) w Reflex to ID Panel     Status: None (Preliminary result)   Collection Time: 09/22/16  8:35 AM  Result Value Ref Range Status   Specimen Description BLOOD LEFT ASSIST CONTROL  Final   Special Requests BOTTLES DRAWN AEROBIC AND ANAEROBIC BCAV  Final   Culture NO GROWTH < 24 HOURS  Final   Report Status PENDING  Incomplete    Radiology Reports Dg Abd 1 View  Result Date: 09/20/2016 CLINICAL DATA:  Peritoneal dialysis catheter, poorly functioning. EXAM: ABDOMEN - 1 VIEW COMPARISON:  01/06/2016.  CT 05/04/2016. FINDINGS: Peritoneal dialysis catheter enters from the left an extends to the left upper quadrant, showing a coiled configuration. No evidence of kink. Left upper quadrant a somewhat unusual positioned 4 tests. Bowel gas pattern is normal. No significant bone finding. Previously, catheter has been  in the right abdomen/pelvis. IMPRESSION: Dialysis catheter tip is in the left upper quadrant, in a coiled configuration. No evidence of kink. Left upper quadrant  not the most common location for catheter position. Previously this has been in the right abdomen/ pelvis in this patient. Electronically Signed   By: Nelson Chimes M.D.   On: 09/20/2016 13:14   Dg Chest Portable 1 View  Result Date: 09/21/2016 CLINICAL DATA:  Dialysis catheter placement.  Dyspnea. EXAM: PORTABLE CHEST 1 VIEW COMPARISON:  None. FINDINGS: New dual-lumen right IJ dialysis catheter is noted with tip projecting over the proximal right atrium. The interstitial pulmonary edema persists. Trace pleural effusions bilaterally. No pneumothorax. Stable cardiomegaly with aortic atherosclerosis. No acute nor suspicious osseous abnormalities. IMPRESSION: Cardiomegaly with stable interstitial pulmonary edema. New right-sided IJ dialysis catheter tip in the proximal right atrium. No pneumothorax. Electronically Signed   By: Ashley Royalty M.D.   On: 09/21/2016 22:51     CBC  Recent Labs Lab 09/21/16 2244 09/22/16 0116 09/23/16 1020  WBC 11.6* 15.3* 6.8  HGB 10.3* 9.2* 8.7*  HCT 31.2* 27.4* 25.8*  PLT 237 184 154  MCV 92.5 91.8 91.7  MCH 30.4 30.9 31.0  MCHC 32.9 33.7 33.8  RDW 16.6* 16.1* 16.4*  LYMPHSABS 1.0  --   --   MONOABS 0.6  --   --   EOSABS 0.1  --   --   BASOSABS 0.1  --   --     Chemistries   Recent Labs Lab 09/21/16 2244 09/22/16 0116  NA 136 137  K 5.9* 5.2*  CL 101 101  CO2 22 24  GLUCOSE 158* 137*  BUN 89* 93*  CREATININE 13.40* 14.17*  CALCIUM 7.0* 6.8*  MG  --  1.8  AST 16  --   ALT 17  --   ALKPHOS 54  --   BILITOT 0.6  --    ------------------------------------------------------------------------------------------------------------------ estimated creatinine clearance is 3.3 mL/min (by C-G formula based on SCr of 14.17 mg/dL (H)). ------------------------------------------------------------------------------------------------------------------ No results for input(s): HGBA1C in the last 72  hours. ------------------------------------------------------------------------------------------------------------------ No results for input(s): CHOL, HDL, LDLCALC, TRIG, CHOLHDL, LDLDIRECT in the last 72 hours. ------------------------------------------------------------------------------------------------------------------ No results for input(s): TSH, T4TOTAL, T3FREE, THYROIDAB in the last 72 hours.  Invalid input(s): FREET3 ------------------------------------------------------------------------------------------------------------------ No results for input(s): VITAMINB12, FOLATE, FERRITIN, TIBC, IRON, RETICCTPCT in the last 72 hours.  Coagulation profile No results for input(s): INR, PROTIME in the last 168 hours.  No results for input(s): DDIMER in the last 72 hours.  Cardiac Enzymes  Recent Labs Lab 09/21/16 2244  TROPONINI 0.03*   ------------------------------------------------------------------------------------------------------------------ Invalid input(s): POCBNP    Assessment & Plan   1. Acute diastolic CHF Patient's peritoneal dialysis not functioning properly Patient will have hemodialysis today We will reassess his oxygen urination is normal chem be discharged home later today    2. Hypertension: Blood pressures improved - Continue home regimen of furosemide, metolazone, amlodipine, hydralazine, and carvedilol  3. Diabetes Mellitus type II:   Under good control continue current regimen   4. AOKD: with iron deficiency:  Epogen therapy per nephrology   5. Secondary Hyperparathyroidism with hyperphosphatemia: phos 6.2 -Continue current therapy  6. hypothyrodism unspecified continue therapy with Synthroid  7 misc: heprin for dvt proph     Code Status Orders        Start     Ordered   09/21/16 2335  Full code  Continuous     09/21/16 2335    Code Status History  Date Active Date Inactive Code Status Order ID Comments User Context    09/07/2016 12:41 PM 09/08/2016  2:24 PM Full Code [id number]  Isaias Cowman, MD Inpatient   07/16/2016 12:15 PM 07/20/2016  2:45 PM Full Code [id number]  Wilhelmina Mcardle, MD ED   12/19/2015  2:16 PM 12/21/2015  8:57 PM Full Code [id number]  Fritzi Mandes, MD Inpatient            Consults  nephrlogy   DVT Prophylaxis  heparin  Lab Results  Component Value Date   PLT 154 09/23/2016     Time Spent in minutes   24min  Greater than 50% of time spent in care coordination and counseling patient regarding the condition and plan of care.   Dustin Flock M.D on 09/23/2016 at 11:18 AM  Between 7am to 6pm - Pager - [phone]  After 6pm go to [email or web address] - password [email or web address] [facility]   Office  [phone]

## 2016-09-23 NOTE — Discharge Instructions (Signed)
[place] at [place]:  Renal and diabetic diet  DISCHARGE CONDITION:  stable  ACTIVITY:  As tolerted  OXYGEN:  Home Oxygen:no   Oxygen Delivery: no  DISCHARGE LOCATION:  home   ADDITIONAL DISCHARGE INSTRUCTION:   If you experience worsening of your admission symptoms, develop shortness of breath, life threatening emergency, suicidal or homicidal thoughts you must seek medical attention immediately by calling 911 or calling your MD immediately  if symptoms less severe.  You Must read complete instructions/literature along with all the possible adverse reactions/side effects for all the Medicines you take and that have been prescribed to you. Take any new Medicines after you have completely understood and accpet all the possible adverse reactions/side effects.   Please note  You were cared for by a hospitalist during your hospital stay. If you have any questions about your discharge medications or the care you received while you were in the hospital after you are discharged, you can call the unit and asked to speak with the hospitalist on call if the hospitalist that took care of you is not available. Once you are discharged, your primary care physician will handle any further medical issues. Please note that NO REFILLS for any discharge medications will be authorized once you are discharged, as it is imperative that you return to your primary care physician (or establish a relationship with a primary care physician if you do not have one) for your aftercare needs so that they can reassess your need for medications and monitor your lab values.

## 2016-09-23 NOTE — Progress Notes (Signed)
Pre hd assessment  

## 2016-09-23 NOTE — Progress Notes (Signed)
Dr Ashby Dawes notified of no sliding scale insulin order active, stated would read over chart and put in orders if necessary.

## 2016-09-23 NOTE — Progress Notes (Signed)
  End of hd 

## 2016-09-23 NOTE — Plan of Care (Signed)
Problem: Health Behavior/Discharge Planning: Goal: Ability to manage health-related needs will improve Outcome: Progressing Educated patient on the parameters that her O2 saturation needed to be in

## 2016-09-23 NOTE — Progress Notes (Signed)
Post hd vitals 

## 2016-09-23 NOTE — Progress Notes (Signed)
Hd start 

## 2016-09-23 NOTE — Progress Notes (Signed)
Patient ambulated once around the unit her oxygen saturation post ambulation was 94%

## 2016-09-23 NOTE — Progress Notes (Signed)
[facility]  ROUNDING NOTE   Subjective:   Seen and examined on hemodialysis. Third hemodialysis treatment. Tolerating well. UF goal of 1.2 litres. Total UF since admission of 2.3 litres  Objective:  Vital signs in last 24 hours:  Temp:  [98.4 F (36.9 C)-99.9 F (37.7 C)] 98.7 F (37.1 C) (03/03 1010) Pulse Rate:  [74-93] 77 (03/03 1245) Resp:  [14-33] 19 (03/03 1245) BP: (71-166)/(47-92) 89/50 (03/03 1245) SpO2:  [94 %-100 %] 100 % (03/03 1245) FiO2 (%):  [87 %] 87 % (03/03 1000) Weight:  [67.4 kg (148 lb 9.4 oz)-70.4 kg (155 lb 3.3 oz)] 68.2 kg (150 lb 5.7 oz) (03/03 1010)  Weight change: 0.4 kg (14.1 oz) Filed Weights   09/22/16 1600 09/22/16 1937 09/23/16 1010  Weight: 70.4 kg (155 lb 3.3 oz) 67.4 kg (148 lb 9.4 oz) 68.2 kg (150 lb 5.7 oz)    Intake/Output: I/O last 3 completed shifts: In: 480 [P.O.:480] Out: 3299 [Urine:120; MEQAS:3419]   Intake/Output this shift:  Total I/O In: 480 [P.O.:480] Out: -   Physical Exam: General: NAD, laying in bed  Head: Normocephalic, atraumatic. Moist oral mucosal membranes  Eyes: Anicteric, PERRL  Neck: Supple, trachea midline  Lungs:  Bilateral diminished at bases, 3 Litres [place]  Heart: Regular rate and rhythm  Abdomen:  Soft, nontender,   Extremities:  trace peripheral edema.  Neurologic: Nonfocal, moving all four extremities  Skin: No lesions  Access: PD catheter, Right IJ permcath 3/1    Basic Metabolic Panel:  Recent Labs Lab 09/21/16 2244 09/22/16 0116 09/23/16 1020  NA 136 137 136  K 5.9* 5.2* 4.2  CL 101 101 96*  CO2 22 24 31   GLUCOSE 158* 137* 151*  BUN 89* 93* 30*  CREATININE 13.40* 14.17* 5.52*  CALCIUM 7.0* 6.8* 7.3*  MG  --  1.8  --   PHOS  --  6.2* 3.2    Liver Function Tests:  Recent Labs Lab 09/21/16 2244 09/23/16 1020  AST 16  --   ALT 17  --   ALKPHOS 54  --   BILITOT 0.6  --   PROT 6.2*  --   ALBUMIN 2.9* 2.6*   No results for input(s): LIPASE, AMYLASE in the last  168 hours. No results for input(s): AMMONIA in the last 168 hours.  CBC:  Recent Labs Lab 09/21/16 2244 09/22/16 0116 09/23/16 1020  WBC 11.6* 15.3* 6.8  NEUTROABS 9.8*  --   --   HGB 10.3* 9.2* 8.7*  HCT 31.2* 27.4* 25.8*  MCV 92.5 91.8 91.7  PLT 237 184 154    Cardiac Enzymes:  Recent Labs Lab 09/21/16 2244  TROPONINI 0.03*    BNP: Invalid input(s): POCBNP  CBG:  Recent Labs Lab 09/22/16 1140 09/22/16 2100 09/22/16 2333 09/23/16 0359 09/23/16 0745  GLUCAP 159* 131* 196* 95 97    Microbiology: Results for orders placed or performed during the hospital encounter of 09/21/16  MRSA PCR Screening     Status: None   Collection Time: 09/22/16 12:40 AM  Result Value Ref Range Status   MRSA by PCR NEGATIVE NEGATIVE Final    Comment:        The GeneXpert MRSA Assay (FDA approved for NASAL specimens only), is one component of a comprehensive MRSA colonization surveillance program. It is not intended to diagnose MRSA infection nor to guide or monitor treatment for MRSA infections.   CULTURE, BLOOD (ROUTINE X 2) w Reflex to ID Panel     Status:  None (Preliminary result)   Collection Time: 09/22/16  8:20 AM  Result Value Ref Range Status   Specimen Description BLOOD RIGHT ASSIST CONTROL  Final   Special Requests BOTTLES DRAWN AEROBIC AND ANAEROBIC BCAV  Final   Culture NO GROWTH < 24 HOURS  Final   Report Status PENDING  Incomplete  CULTURE, BLOOD (ROUTINE X 2) w Reflex to ID Panel     Status: None (Preliminary result)   Collection Time: 09/22/16  8:35 AM  Result Value Ref Range Status   Specimen Description BLOOD LEFT ASSIST CONTROL  Final   Special Requests BOTTLES DRAWN AEROBIC AND ANAEROBIC BCAV  Final   Culture NO GROWTH < 24 HOURS  Final   Report Status PENDING  Incomplete    Coagulation Studies: No results for input(s): LABPROT, INR in the last 72 hours.  Urinalysis: No results for input(s): COLORURINE, LABSPEC, PHURINE, GLUCOSEU, HGBUR,  BILIRUBINUR, KETONESUR, PROTEINUR, UROBILINOGEN, NITRITE, LEUKOCYTESUR in the last 72 hours.  Invalid input(s): APPERANCEUR    Imaging: Dg Chest Portable 1 View  Result Date: 09/21/2016 CLINICAL DATA:  Dialysis catheter placement.  Dyspnea. EXAM: PORTABLE CHEST 1 VIEW COMPARISON:  None. FINDINGS: New dual-lumen right IJ dialysis catheter is noted with tip projecting over the proximal right atrium. The interstitial pulmonary edema persists. Trace pleural effusions bilaterally. No pneumothorax. Stable cardiomegaly with aortic atherosclerosis. No acute nor suspicious osseous abnormalities. IMPRESSION: Cardiomegaly with stable interstitial pulmonary edema. New right-sided IJ dialysis catheter tip in the proximal right atrium. No pneumothorax. Electronically Signed   By: Ashley Royalty M.D.   On: 09/21/2016 22:51     Medications:    . amLODipine  5 mg Oral Daily  . aspirin EC  81 mg Oral Daily  . calcium acetate  667 mg Oral TID WC  . carvedilol  12.5 mg Oral BID WC  . clopidogrel  75 mg Oral Q breakfast  . furosemide  80 mg Oral Daily  . heparin  5,000 Units Subcutaneous Q8H  . hydrALAZINE  50 mg Oral BID  . levothyroxine  75 mcg Oral QAC breakfast  . lisinopril  5 mg Oral Daily  . pravastatin  20 mg Oral q1800  . sevelamer carbonate  2.4 g Oral TID WC  . vitamin B-12  1,000 mcg Oral Daily   acetaminophen, ipratropium-albuterol, ondansetron (ZOFRAN) IV  Assessment/ Plan:  Bethany Lee is a 66 year old Asian ([place]) female with diabetes mellitus type 2, diabetic neuropathy, diabetic nephropathy (biopsy proven), anemia, hypertension, hyperlipidemia   Peritoneal dialysis [place]. Dr. Juleen China Prescription: 6 nights a week. CAPD 10 hours 5 exchanges 1730mL fills 2.5% dextrose   1. ESRD with hyperkalemia: PD kt/v barely at goal from 1.73. Patient getting inadequate peritoneal dialysis due to catheter migration/displacement.  Emergent hemodialysis on admission.  Has completed  three hemodialysis treatments. Outpatient plan of back up hemodialysis for two weeks at [place]. TTS through permcath Dr. Lucky Cowboy to do PD catheter revision later next week  2. Hypertension: better controlled. Volume driven.  - home regimen of furosemide, metolazone, amlodipine, hydralazine, and carvedilol  3. Diabetes Mellitus type II: Insulin dependent.: at goal, Hemoglobin A1c 5.9%   4. AOKD: with iron deficiency: hemoglobin 8.7. Missed outpatient epo due to current illness. - epo with hemodialysis treatment TTS  5. Secondary Hyperparathyroidism with hyperphosphatemia: phos 3.2 - Currently ordered sevelamer and calcium acetate   LOS: 2 Britten Seyfried 3/3/20181:09 PM

## 2016-09-24 DIAGNOSIS — N186 End stage renal disease: Secondary | ICD-10-CM | POA: Diagnosis not present

## 2016-09-24 DIAGNOSIS — Z992 Dependence on renal dialysis: Secondary | ICD-10-CM | POA: Diagnosis not present

## 2016-09-24 NOTE — Discharge Summary (Signed)
Bethany Lee at [facility], 66 y.o., DOB 1950-08-28, MRN [id number]. Admission date: 09/21/2016 Discharge Date 09/24/2016 Primary MD Tracie Harrier, MD Admitting Physician No admitting provider for patient encounter.  Admission Diagnosis  End stage renal disease ([place]) [N18.6] Acute respiratory distress [R06.03] Acute pulmonary edema (HCC) [J81.0]  Discharge Diagnosis   Active Problems:   Acute hypoxic respiratory failure (HCC)   Acute on chronic diastolic CHF   End-stage renal disease   Essential hypertension   Diabetes type 2 A anemia of chronic disease Secondary hyperparathyroidism Hypothyroidism        Hospital Course  A shin is a 66 year old female with history of end-stage renal disease who is on pericardial dialysis. However her toenail dialysis is not functional. Patient had a temporary catheter placed. Patient presented to the ED with severe shortness of breath. She had to be placed on BiPAP and was admitted by the intensivist. Patient underwent dialysis and subsequently she was transferred to the floor in our service. Patient again had dialysis. She has been seen by nephrology they have set him up for outpatient dialysis for the next 2 weeks until her peritonial catheter can be fixed. Patient's breathing is much better.          Consults  nephrology  Significant Tests:  See full reports for all details     Dg Abd 1 View  Result Date: 09/20/2016 CLINICAL DATA:  Peritoneal dialysis catheter, poorly functioning. EXAM: ABDOMEN - 1 VIEW COMPARISON:  01/06/2016.  CT 05/04/2016. FINDINGS: Peritoneal dialysis catheter enters from the left an extends to the left upper quadrant, showing a coiled configuration. No evidence of kink. Left upper quadrant a somewhat unusual positioned 4 tests. Bowel gas pattern is normal. No significant bone finding. Previously, catheter has been in the right abdomen/pelvis. IMPRESSION: Dialysis  catheter tip is in the left upper quadrant, in a coiled configuration. No evidence of kink. Left upper quadrant not the most common location for catheter position. Previously this has been in the right abdomen/ pelvis in this patient. Electronically Signed   By: Nelson Chimes M.D.   On: 09/20/2016 13:14   Dg Chest Portable 1 View  Result Date: 09/21/2016 CLINICAL DATA:  Dialysis catheter placement.  Dyspnea. EXAM: PORTABLE CHEST 1 VIEW COMPARISON:  None. FINDINGS: New dual-lumen right IJ dialysis catheter is noted with tip projecting over the proximal right atrium. The interstitial pulmonary edema persists. Trace pleural effusions bilaterally. No pneumothorax. Stable cardiomegaly with aortic atherosclerosis. No acute nor suspicious osseous abnormalities. IMPRESSION: Cardiomegaly with stable interstitial pulmonary edema. New right-sided IJ dialysis catheter tip in the proximal right atrium. No pneumothorax. Electronically Signed   By: Ashley Royalty M.D.   On: 09/21/2016 22:51       Today   Subjective:   Bethany Lee  feels well wants to go home  Objective:   Blood pressure (!) 116/40, pulse 96, temperature 98.7 F (37.1 C), resp. rate 20, height 5' (1.524 m), weight 148 lb 5.9 oz (67.3 kg), SpO2 94 %.  .  Intake/Output Summary (Last 24 hours) at 09/24/16 1238 Last data filed at 09/23/16 1320  Gross per 24 hour  Intake                0 ml  Output             1180 ml  Net            -1180 ml    Exam VITAL SIGNS:  Blood pressure (!) 116/40, pulse 96, temperature 98.7 F (37.1 C), resp. rate 20, height 5' (1.524 m), weight 148 lb 5.9 oz (67.3 kg), SpO2 94 %.  GENERAL:  66 y.o.-year-old patient lying in the bed with no acute distress.  EYES: Pupils equal, round, reactive to light and accommodation. No scleral icterus. Extraocular muscles intact.  HEENT: Head atraumatic, normocephalic. Oropharynx and nasopharynx clear.  NECK:  Supple, no jugular venous distention. No thyroid enlargement, no  tenderness.  LUNGS: Normal breath sounds bilaterally, no wheezing, rales,rhonchi or crepitation. No use of accessory muscles of respiration.  CARDIOVASCULAR: S1, S2 normal. No murmurs, rubs, or gallops.  ABDOMEN: Soft, nontender, nondistended. Bowel sounds present. No organomegaly or mass.  EXTREMITIES: No pedal edema, cyanosis, or clubbing.  NEUROLOGIC: Cranial nerves II through XII are intact. Muscle strength 5/5 in all extremities. Sensation intact. Gait not checked.  PSYCHIATRIC: The patient is alert and oriented x 3.  SKIN: No obvious rash, lesion, or ulcer.   Data Review     CBC w Diff: Lab Results  Component Value Date   WBC 6.8 09/23/2016   HGB 8.7 (L) 09/23/2016   HCT 25.8 (L) 09/23/2016   PLT 154 09/23/2016   LYMPHOPCT 8 09/21/2016   MONOPCT 5 09/21/2016   EOSPCT 1 09/21/2016   BASOPCT 1 09/21/2016   CMP: Lab Results  Component Value Date   NA 136 09/23/2016   K 4.2 09/23/2016   K 4.8 01/06/2016   CL 96 (L) 09/23/2016   CO2 31 09/23/2016   BUN 30 (H) 09/23/2016   CREATININE 5.52 (H) 09/23/2016   PROT 6.2 (L) 09/21/2016   ALBUMIN 2.6 (L) 09/23/2016   BILITOT 0.6 09/21/2016   ALKPHOS 54 09/21/2016   AST 16 09/21/2016   ALT 17 09/21/2016  .  Micro Results Recent Results (from the past 240 hour(s))  MRSA PCR Screening     Status: None   Collection Time: 09/22/16 12:40 AM  Result Value Ref Range Status   MRSA by PCR NEGATIVE NEGATIVE Final    Comment:        The GeneXpert MRSA Assay (FDA approved for NASAL specimens only), is one component of a comprehensive MRSA colonization surveillance program. It is not intended to diagnose MRSA infection nor to guide or monitor treatment for MRSA infections.   CULTURE, BLOOD (ROUTINE X 2) w Reflex to ID Panel     Status: None (Preliminary result)   Collection Time: 09/22/16  8:20 AM  Result Value Ref Range Status   Specimen Description BLOOD RIGHT ASSIST CONTROL  Final   Special Requests BOTTLES DRAWN AEROBIC  AND ANAEROBIC BCAV  Final   Culture NO GROWTH 2 DAYS  Final   Report Status PENDING  Incomplete  CULTURE, BLOOD (ROUTINE X 2) w Reflex to ID Panel     Status: None (Preliminary result)   Collection Time: 09/22/16  8:35 AM  Result Value Ref Range Status   Specimen Description BLOOD LEFT ASSIST CONTROL  Final   Special Requests BOTTLES DRAWN AEROBIC AND ANAEROBIC BCAV  Final   Culture NO GROWTH 2 DAYS  Final   Report Status PENDING  Incomplete     Code Status History    Date Active Date Inactive Code Status Order ID Comments User Context   09/21/2016 11:35 PM 09/23/2016  8:10 PM Full Code [id number]  Holley Raring, NP ED   09/07/2016 12:41 PM 09/08/2016  2:24 PM Full Code [id number]  Isaias Cowman, MD Inpatient   07/16/2016 12:15  PM 07/20/2016  2:45 PM Full Code [id number]  Wilhelmina Mcardle, MD ED   12/19/2015  2:16 PM 12/21/2015  8:57 PM Full Code [id number]  Fritzi Mandes, MD Inpatient          Follow-up Information    HANDE,VISHWANATH, MD Follow up in 5 day(s).   Specialty:  Internal Medicine Contact information: [address] [place] [address] [phone]           Discharge Medications   Allergies as of 09/23/2016      Reactions   Penicillins    Childhood loss of consciousness  Has patient had a PCN reaction causing immediate rash, facial/tongue/throat swelling, SOB or lightheadedness with hypotension: Yes Has patient had a PCN reaction causing severe rash involving mucus membranes or skin necrosis: No Has patient had a PCN reaction that required hospitalization Unknown Has patient had a PCN reaction occurring within the last 10 years: Unknown If all of the above answers are "NO", then may proceed with Cephalosporin use.   Valacyclovir Nausea Only      Medication List    TAKE these medications   amLODipine 5 MG tablet Commonly known as:  NORVASC Take 5 mg by mouth daily.   aspirin EC 81 MG tablet Take 1 tablet (81 mg total) by  mouth daily. Start taking after NOV 15   calcium acetate 667 MG capsule Commonly known as:  PHOSLO Take 667 mg by mouth 3 (three) times daily with meals.   carvedilol 12.5 MG tablet Commonly known as:  COREG Take 1 tablet (12.5 mg total) by mouth 2 (two) times daily with a meal.   clopidogrel 75 MG tablet Commonly known as:  PLAVIX Take 1 tablet (75 mg total) by mouth daily with breakfast.   furosemide 80 MG tablet Commonly known as:  LASIX Take 80 mg by mouth 2 (two) times daily.   gentamicin cream 0.1 % Commonly known as:  GARAMYCIN Apply 1 application topically daily. What changed:  additional instructions   hydrALAZINE 50 MG tablet Commonly known as:  APRESOLINE Take 50 mg by mouth 2 (two) times daily.   Insulin Glargine 300 UNIT/ML Sopn Commonly known as:  TOUJEO SOLOSTAR Inject 20 Units into the skin at bedtime. What changed:  how much to take   Levothyroxine Sodium 75 MCG Caps Take 75 mcg by mouth daily before breakfast.   lisinopril 10 MG tablet Commonly known as:  PRINIVIL,ZESTRIL Take 5 mg by mouth daily.   lovastatin 40 MG tablet Commonly known as:  MEVACOR Take 40 mg by mouth daily with supper.   NOVOLOG FLEXPEN 100 UNIT/ML FlexPen Generic drug:  insulin aspart Inject 4-8 Units into the skin 3 (three) times daily with meals. Sliding scale below 200 take 4 units, 200-250=6 units, >250= 8 units   RENVELA 2.4 g Pack Generic drug:  sevelamer carbonate Take 2.4 g by mouth 3 (three) times daily with meals.   sodium bicarbonate 650 MG tablet Take 1,300 mg by mouth 2 (two) times daily.   vitamin B-12 1000 MCG tablet Commonly known as:  CYANOCOBALAMIN Take 1,000 mcg by mouth daily.          Total Time in preparing paper work, data evaluation and todays exam - 35 minutes  Dustin Flock M.D on 09/24/2016 at 12:38 PM  [facility]   Office  [phone]

## 2016-09-25 ENCOUNTER — Telehealth (INDEPENDENT_AMBULATORY_CARE_PROVIDER_SITE_OTHER): Payer: Self-pay

## 2016-09-25 DIAGNOSIS — I340 Nonrheumatic mitral (valve) insufficiency: Secondary | ICD-10-CM | POA: Diagnosis not present

## 2016-09-25 DIAGNOSIS — B789 Strongyloidiasis, unspecified: Secondary | ICD-10-CM | POA: Diagnosis not present

## 2016-09-25 DIAGNOSIS — Z4902 Encounter for fitting and adjustment of peritoneal dialysis catheter: Secondary | ICD-10-CM | POA: Diagnosis not present

## 2016-09-25 DIAGNOSIS — E119 Type 2 diabetes mellitus without complications: Secondary | ICD-10-CM | POA: Diagnosis not present

## 2016-09-25 DIAGNOSIS — Z1231 Encounter for screening mammogram for malignant neoplasm of breast: Secondary | ICD-10-CM | POA: Diagnosis not present

## 2016-09-25 DIAGNOSIS — Z01810 Encounter for preprocedural cardiovascular examination: Secondary | ICD-10-CM | POA: Diagnosis not present

## 2016-09-25 DIAGNOSIS — Z880 Allergy status to penicillin: Secondary | ICD-10-CM | POA: Diagnosis not present

## 2016-09-25 DIAGNOSIS — I517 Cardiomegaly: Secondary | ICD-10-CM | POA: Diagnosis not present

## 2016-09-25 DIAGNOSIS — Z992 Dependence on renal dialysis: Secondary | ICD-10-CM | POA: Diagnosis not present

## 2016-09-25 DIAGNOSIS — Z01818 Encounter for other preprocedural examination: Secondary | ICD-10-CM | POA: Diagnosis not present

## 2016-09-25 DIAGNOSIS — I058 Other rheumatic mitral valve diseases: Secondary | ICD-10-CM | POA: Diagnosis not present

## 2016-09-25 DIAGNOSIS — N2889 Other specified disorders of kidney and ureter: Secondary | ICD-10-CM | POA: Diagnosis not present

## 2016-09-25 DIAGNOSIS — I5031 Acute diastolic (congestive) heart failure: Secondary | ICD-10-CM | POA: Diagnosis not present

## 2016-09-25 DIAGNOSIS — I700 Atherosclerosis of aorta: Secondary | ICD-10-CM | POA: Diagnosis not present

## 2016-09-25 DIAGNOSIS — N186 End stage renal disease: Secondary | ICD-10-CM | POA: Diagnosis not present

## 2016-09-25 DIAGNOSIS — I272 Pulmonary hypertension, unspecified: Secondary | ICD-10-CM | POA: Diagnosis not present

## 2016-09-25 DIAGNOSIS — E039 Hypothyroidism, unspecified: Secondary | ICD-10-CM | POA: Diagnosis not present

## 2016-09-25 DIAGNOSIS — F17220 Nicotine dependence, chewing tobacco, uncomplicated: Secondary | ICD-10-CM | POA: Diagnosis not present

## 2016-09-25 DIAGNOSIS — Z833 Family history of diabetes mellitus: Secondary | ICD-10-CM | POA: Diagnosis not present

## 2016-09-25 DIAGNOSIS — Z7682 Awaiting organ transplant status: Secondary | ICD-10-CM | POA: Diagnosis not present

## 2016-09-25 NOTE — Telephone Encounter (Signed)
Attempted to contact patient to schedule surgery, I left a message for patient to return my call.

## 2016-09-26 DIAGNOSIS — E119 Type 2 diabetes mellitus without complications: Secondary | ICD-10-CM | POA: Diagnosis not present

## 2016-09-26 DIAGNOSIS — Z992 Dependence on renal dialysis: Secondary | ICD-10-CM | POA: Diagnosis not present

## 2016-09-26 DIAGNOSIS — N186 End stage renal disease: Secondary | ICD-10-CM | POA: Diagnosis not present

## 2016-09-26 DIAGNOSIS — E785 Hyperlipidemia, unspecified: Secondary | ICD-10-CM | POA: Diagnosis not present

## 2016-09-26 DIAGNOSIS — Z794 Long term (current) use of insulin: Secondary | ICD-10-CM | POA: Diagnosis not present

## 2016-09-27 LAB — CULTURE, BLOOD (ROUTINE X 2)
Culture: NO GROWTH
Culture: NO GROWTH

## 2016-09-28 ENCOUNTER — Other Ambulatory Visit (INDEPENDENT_AMBULATORY_CARE_PROVIDER_SITE_OTHER): Payer: Self-pay | Admitting: Vascular Surgery

## 2016-09-28 ENCOUNTER — Encounter (INDEPENDENT_AMBULATORY_CARE_PROVIDER_SITE_OTHER): Payer: Self-pay

## 2016-09-28 DIAGNOSIS — Z992 Dependence on renal dialysis: Secondary | ICD-10-CM | POA: Diagnosis not present

## 2016-09-28 DIAGNOSIS — N186 End stage renal disease: Secondary | ICD-10-CM | POA: Diagnosis not present

## 2016-09-28 NOTE — Telephone Encounter (Signed)
Patient scheduled for surgery and has been contacted.

## 2016-09-30 DIAGNOSIS — N186 End stage renal disease: Secondary | ICD-10-CM | POA: Diagnosis not present

## 2016-09-30 DIAGNOSIS — Z992 Dependence on renal dialysis: Secondary | ICD-10-CM | POA: Diagnosis not present

## 2016-10-02 ENCOUNTER — Inpatient Hospital Stay: Admission: RE | Admit: 2016-10-02 | Payer: Self-pay | Source: Ambulatory Visit

## 2016-10-03 DIAGNOSIS — N186 End stage renal disease: Secondary | ICD-10-CM | POA: Diagnosis not present

## 2016-10-03 DIAGNOSIS — Z992 Dependence on renal dialysis: Secondary | ICD-10-CM | POA: Diagnosis not present

## 2016-10-04 DIAGNOSIS — E113511 Type 2 diabetes mellitus with proliferative diabetic retinopathy with macular edema, right eye: Secondary | ICD-10-CM | POA: Diagnosis not present

## 2016-10-04 DIAGNOSIS — E113412 Type 2 diabetes mellitus with severe nonproliferative diabetic retinopathy with macular edema, left eye: Secondary | ICD-10-CM | POA: Diagnosis not present

## 2016-10-05 ENCOUNTER — Inpatient Hospital Stay: Admission: RE | Admit: 2016-10-05 | Payer: Self-pay | Source: Ambulatory Visit

## 2016-10-05 DIAGNOSIS — Z992 Dependence on renal dialysis: Secondary | ICD-10-CM | POA: Diagnosis not present

## 2016-10-05 DIAGNOSIS — N186 End stage renal disease: Secondary | ICD-10-CM | POA: Diagnosis not present

## 2016-10-07 DIAGNOSIS — N186 End stage renal disease: Secondary | ICD-10-CM | POA: Diagnosis not present

## 2016-10-07 DIAGNOSIS — Z992 Dependence on renal dialysis: Secondary | ICD-10-CM | POA: Diagnosis not present

## 2016-10-09 ENCOUNTER — Encounter
Admission: RE | Admit: 2016-10-09 | Discharge: 2016-10-09 | Disposition: A | Discharge status: Home / Self Care | Payer: Medicare HMO | Source: Ambulatory Visit | Attending: Vascular Surgery | Admitting: Vascular Surgery

## 2016-10-09 DIAGNOSIS — Z79899 Other long term (current) drug therapy: Secondary | ICD-10-CM | POA: Diagnosis not present

## 2016-10-09 DIAGNOSIS — N186 End stage renal disease: Secondary | ICD-10-CM | POA: Diagnosis not present

## 2016-10-09 DIAGNOSIS — D631 Anemia in chronic kidney disease: Secondary | ICD-10-CM | POA: Diagnosis not present

## 2016-10-09 DIAGNOSIS — E1140 Type 2 diabetes mellitus with diabetic neuropathy, unspecified: Secondary | ICD-10-CM | POA: Diagnosis not present

## 2016-10-09 DIAGNOSIS — E039 Hypothyroidism, unspecified: Secondary | ICD-10-CM | POA: Diagnosis not present

## 2016-10-09 DIAGNOSIS — Z794 Long term (current) use of insulin: Secondary | ICD-10-CM | POA: Diagnosis not present

## 2016-10-09 DIAGNOSIS — X58XXXA Exposure to other specified factors, initial encounter: Secondary | ICD-10-CM | POA: Diagnosis not present

## 2016-10-09 DIAGNOSIS — Z955 Presence of coronary angioplasty implant and graft: Secondary | ICD-10-CM | POA: Diagnosis not present

## 2016-10-09 DIAGNOSIS — E1122 Type 2 diabetes mellitus with diabetic chronic kidney disease: Secondary | ICD-10-CM | POA: Diagnosis not present

## 2016-10-09 DIAGNOSIS — Z880 Allergy status to penicillin: Secondary | ICD-10-CM | POA: Diagnosis not present

## 2016-10-09 DIAGNOSIS — T85621A Displacement of intraperitoneal dialysis catheter, initial encounter: Secondary | ICD-10-CM | POA: Diagnosis not present

## 2016-10-09 DIAGNOSIS — Z90710 Acquired absence of both cervix and uterus: Secondary | ICD-10-CM | POA: Diagnosis not present

## 2016-10-09 DIAGNOSIS — T85611A Breakdown (mechanical) of intraperitoneal dialysis catheter, initial encounter: Secondary | ICD-10-CM | POA: Diagnosis present

## 2016-10-09 DIAGNOSIS — E7800 Pure hypercholesterolemia, unspecified: Secondary | ICD-10-CM | POA: Diagnosis not present

## 2016-10-09 DIAGNOSIS — Z9841 Cataract extraction status, right eye: Secondary | ICD-10-CM | POA: Diagnosis not present

## 2016-10-09 DIAGNOSIS — Z7982 Long term (current) use of aspirin: Secondary | ICD-10-CM | POA: Diagnosis not present

## 2016-10-09 DIAGNOSIS — Z87891 Personal history of nicotine dependence: Secondary | ICD-10-CM | POA: Diagnosis not present

## 2016-10-09 DIAGNOSIS — Z8619 Personal history of other infectious and parasitic diseases: Secondary | ICD-10-CM | POA: Diagnosis not present

## 2016-10-09 DIAGNOSIS — I120 Hypertensive chronic kidney disease with stage 5 chronic kidney disease or end stage renal disease: Secondary | ICD-10-CM | POA: Diagnosis not present

## 2016-10-09 DIAGNOSIS — Z992 Dependence on renal dialysis: Secondary | ICD-10-CM | POA: Diagnosis not present

## 2016-10-09 DIAGNOSIS — R251 Tremor, unspecified: Secondary | ICD-10-CM | POA: Diagnosis not present

## 2016-10-09 DIAGNOSIS — Z888 Allergy status to other drugs, medicaments and biological substances status: Secondary | ICD-10-CM | POA: Diagnosis not present

## 2016-10-09 HISTORY — DX: Atherosclerotic heart disease of native coronary artery without angina pectoris: I25.10

## 2016-10-09 HISTORY — DX: Heart failure, unspecified: I50.9

## 2016-10-09 LAB — CBC WITH DIFFERENTIAL/PLATELET
BASOS PCT: 1 %
Basophils Absolute: 0 10*3/uL (ref 0–0.1)
Eosinophils Absolute: 0.2 10*3/uL (ref 0–0.7)
Eosinophils Relative: 3 %
HEMATOCRIT: 30.9 % — AB (ref 35.0–47.0)
HEMOGLOBIN: 10.5 g/dL — AB (ref 12.0–16.0)
LYMPHS PCT: 13 %
Lymphs Abs: 0.8 10*3/uL — ABNORMAL LOW (ref 1.0–3.6)
MCH: 31.4 pg (ref 26.0–34.0)
MCHC: 33.8 g/dL (ref 32.0–36.0)
MCV: 92.8 fL (ref 80.0–100.0)
MONOS PCT: 8 %
Monocytes Absolute: 0.5 10*3/uL (ref 0.2–0.9)
NEUTROS ABS: 4.5 10*3/uL (ref 1.4–6.5)
NEUTROS PCT: 75 %
Platelets: 156 10*3/uL (ref 150–440)
RBC: 3.34 MIL/uL — ABNORMAL LOW (ref 3.80–5.20)
RDW: 17.7 % — ABNORMAL HIGH (ref 11.5–14.5)
WBC: 6.1 10*3/uL (ref 3.6–11.0)

## 2016-10-09 LAB — BASIC METABOLIC PANEL
Anion gap: 7 (ref 5–15)
BUN: 31 mg/dL — ABNORMAL HIGH (ref 6–20)
CALCIUM: 7.7 mg/dL — AB (ref 8.9–10.3)
CHLORIDE: 100 mmol/L — AB (ref 101–111)
CO2: 29 mmol/L (ref 22–32)
Creatinine, Ser: 6.81 mg/dL — ABNORMAL HIGH (ref 0.44–1.00)
GFR calc non Af Amer: 6 mL/min — ABNORMAL LOW (ref >60)
GFR, EST AFRICAN AMERICAN: 6 mL/min — AB (ref >60)
GLUCOSE: 87 mg/dL (ref 65–99)
Potassium: 3.8 mmol/L (ref 3.5–5.1)
Sodium: 136 mmol/L (ref 135–145)

## 2016-10-09 LAB — PROTIME-INR
INR: 0.92
Prothrombin Time: 12.3 seconds (ref 11.4–15.2)

## 2016-10-09 LAB — APTT: aPTT: 33 seconds (ref 24–36)

## 2016-10-09 LAB — SURGICAL PCR SCREEN
MRSA, PCR: NEGATIVE
STAPHYLOCOCCUS AUREUS: NEGATIVE

## 2016-10-09 MED ORDER — CHLORHEXIDINE GLUCONATE CLOTH 2 % EX PADS: 6.0000 | MEDICATED_PAD | Freq: Once | CUTANEOUS | Status: DC

## 2016-10-09 NOTE — Patient Instructions (Addendum)
Your procedure is scheduled on: 10/11/17 Wed Report to Same Day Surgery 2nd floor medical mall [facility] Entrance-take elevator on left to 2nd floor.  Check in with surgery information desk.) To find out your arrival time please call [phone] between 1PM - 3PM on 10/10/16 Tues  Remember: Instructions that are not followed completely may result in serious medical risk, up to and including death, or upon the discretion of your surgeon and anesthesiologist your surgery may need to be rescheduled.    _x___ 1. Do not eat food or drink liquids after midnight. No gum chewing or                              hard candies.     __x__ 2. No Alcohol for 24 hours before or after surgery.   __x__3. No Smoking for 24 prior to surgery.   ____  4. Bring all medications with you on the day of surgery if instructed.    __x__ 5. Notify your doctor if there is any change in your medical condition     (cold, fever, infections).     Do not wear jewelry, make-up, hairpins, clips or nail polish.  Do not wear lotions, powders, or perfumes. You may wear deodorant.  Do not shave 48 hours prior to surgery. Men may shave face and neck.  Do not bring valuables to the hospital.    [facility] is not responsible for any belongings or valuables.               Contacts, dentures or bridgework may not be worn into surgery.  Leave your suitcase in the car. After surgery it may be brought to your room.  For patients admitted to the hospital, discharge time is determined by your                       treatment team.   Patients discharged the day of surgery will not be allowed to drive home.  You will need someone to drive you home and stay with you the night of your procedure.    Please read over the following fact sheets that you were given:   [facility] Preparing for Surgery and or MRSA Information   _x___ Take anti-hypertensive (unless it includes a diuretic), cardiac, seizure, asthma,     anti-reflux and  psychiatric medicines. These include:  1. carvedilol (COREG  2.hydrALAZINE (APRESOLINE  3.levothyroxine (SYNTHROID, LEVOTHROID) 75   4.lisinopril (PRINIVIL,ZESTRIL  5.  6.  ____Fleets enema or Magnesium Citrate as directed.   _x___ Use CHG Soap or sage wipes as directed on instruction sheet   ____ Use inhalers on the day of surgery and bring to hospital day of surgery  ____ Stop Metformin and Janumet 2 days prior to surgery.    __x__ Take 1/2 of usual insulin dose the night before surgery and none on the morning     surgery.   _x___ Follow recommendations from Cardiologist, Pulmonologist or PCP regarding          stopping Aspirin, Coumadin, Pllavix ,Eliquis, Effient, or Pradaxa, and Pletal.  X____Stop Anti-inflammatories such as Advil, Aleve, Ibuprofen, Motrin, Naproxen, Naprosyn, Goodies powders or aspirin products. OK to take Tylenol and                          Celebrex.   _x___ Stop supplements until after surgery.  But may continue Vitamin D, Vitamin B,       and multivitamin.   ____ Bring C-Pap to the hospital.

## 2016-10-10 DIAGNOSIS — Z992 Dependence on renal dialysis: Secondary | ICD-10-CM | POA: Diagnosis not present

## 2016-10-10 DIAGNOSIS — N186 End stage renal disease: Secondary | ICD-10-CM | POA: Diagnosis not present

## 2016-10-10 LAB — TYPE AND SCREEN
ABO/RH(D): A POS
Antibody Screen: NEGATIVE

## 2016-10-10 MED ORDER — CLINDAMYCIN PHOSPHATE 300 MG/50ML IV SOLN
300.0000 mg | Freq: Once | INTRAVENOUS | Status: AC
  Administered 2016-10-11: 300 mg via INTRAVENOUS

## 2016-10-11 ENCOUNTER — Ambulatory Visit
Admission: RE | Admit: 2016-10-11 | Discharge: 2016-10-11 | Disposition: A | Discharge status: Home / Self Care | Payer: Medicare HMO | Source: Ambulatory Visit | Attending: Vascular Surgery | Admitting: Vascular Surgery

## 2016-10-11 ENCOUNTER — Encounter
Admission: RE | Disposition: A | Discharge status: Home / Self Care | Payer: Self-pay | Source: Ambulatory Visit | Attending: Vascular Surgery

## 2016-10-11 ENCOUNTER — Ambulatory Visit: Payer: Medicare HMO | Admitting: Certified Registered"

## 2016-10-11 DIAGNOSIS — D631 Anemia in chronic kidney disease: Secondary | ICD-10-CM | POA: Insufficient documentation

## 2016-10-11 DIAGNOSIS — Z794 Long term (current) use of insulin: Secondary | ICD-10-CM | POA: Insufficient documentation

## 2016-10-11 DIAGNOSIS — E7800 Pure hypercholesterolemia, unspecified: Secondary | ICD-10-CM | POA: Insufficient documentation

## 2016-10-11 DIAGNOSIS — E1140 Type 2 diabetes mellitus with diabetic neuropathy, unspecified: Secondary | ICD-10-CM | POA: Insufficient documentation

## 2016-10-11 DIAGNOSIS — R251 Tremor, unspecified: Secondary | ICD-10-CM | POA: Insufficient documentation

## 2016-10-11 DIAGNOSIS — I509 Heart failure, unspecified: Secondary | ICD-10-CM | POA: Diagnosis not present

## 2016-10-11 DIAGNOSIS — Z7982 Long term (current) use of aspirin: Secondary | ICD-10-CM | POA: Insufficient documentation

## 2016-10-11 DIAGNOSIS — Z880 Allergy status to penicillin: Secondary | ICD-10-CM | POA: Insufficient documentation

## 2016-10-11 DIAGNOSIS — N186 End stage renal disease: Secondary | ICD-10-CM | POA: Diagnosis not present

## 2016-10-11 DIAGNOSIS — Z79899 Other long term (current) drug therapy: Secondary | ICD-10-CM | POA: Insufficient documentation

## 2016-10-11 DIAGNOSIS — T85621A Displacement of intraperitoneal dialysis catheter, initial encounter: Secondary | ICD-10-CM | POA: Insufficient documentation

## 2016-10-11 DIAGNOSIS — E039 Hypothyroidism, unspecified: Secondary | ICD-10-CM | POA: Insufficient documentation

## 2016-10-11 DIAGNOSIS — X58XXXA Exposure to other specified factors, initial encounter: Secondary | ICD-10-CM | POA: Insufficient documentation

## 2016-10-11 DIAGNOSIS — Z8619 Personal history of other infectious and parasitic diseases: Secondary | ICD-10-CM | POA: Insufficient documentation

## 2016-10-11 DIAGNOSIS — Z992 Dependence on renal dialysis: Secondary | ICD-10-CM | POA: Diagnosis not present

## 2016-10-11 DIAGNOSIS — E1122 Type 2 diabetes mellitus with diabetic chronic kidney disease: Secondary | ICD-10-CM | POA: Diagnosis not present

## 2016-10-11 DIAGNOSIS — Z87891 Personal history of nicotine dependence: Secondary | ICD-10-CM | POA: Insufficient documentation

## 2016-10-11 DIAGNOSIS — I2510 Atherosclerotic heart disease of native coronary artery without angina pectoris: Secondary | ICD-10-CM | POA: Diagnosis not present

## 2016-10-11 DIAGNOSIS — Z90710 Acquired absence of both cervix and uterus: Secondary | ICD-10-CM | POA: Insufficient documentation

## 2016-10-11 DIAGNOSIS — I120 Hypertensive chronic kidney disease with stage 5 chronic kidney disease or end stage renal disease: Secondary | ICD-10-CM | POA: Diagnosis not present

## 2016-10-11 DIAGNOSIS — Z888 Allergy status to other drugs, medicaments and biological substances status: Secondary | ICD-10-CM | POA: Insufficient documentation

## 2016-10-11 DIAGNOSIS — Z955 Presence of coronary angioplasty implant and graft: Secondary | ICD-10-CM | POA: Insufficient documentation

## 2016-10-11 DIAGNOSIS — Z9841 Cataract extraction status, right eye: Secondary | ICD-10-CM | POA: Insufficient documentation

## 2016-10-11 HISTORY — PX: CAPD INSERTION: SHX5233

## 2016-10-11 LAB — POCT I-STAT 4, (NA,K, GLUC, HGB,HCT)
Glucose, Bld: 90 mg/dL (ref 65–99)
HCT: 30 % — ABNORMAL LOW (ref 36.0–46.0)
Hemoglobin: 10.2 g/dL — ABNORMAL LOW (ref 12.0–15.0)
Potassium: 4 mmol/L (ref 3.5–5.1)
Sodium: 138 mmol/L (ref 135–145)

## 2016-10-11 LAB — GLUCOSE, CAPILLARY
Glucose-Capillary: 92 mg/dL (ref 65–99)
Glucose-Capillary: 74 mg/dL (ref 65–99)
Glucose-Capillary: 70 mg/dL (ref 65–99)
Glucose-Capillary: 74 mg/dL (ref 65–99)
Glucose-Capillary: 158 mg/dL — ABNORMAL HIGH (ref 65–99)

## 2016-10-11 SURGERY — LAPAROSCOPIC INSERTION CONTINUOUS AMBULATORY PERITONEAL DIALYSIS  (CAPD) CATHETER
Anesthesia: General | Wound class: Clean

## 2016-10-11 MED ORDER — NEOSTIGMINE METHYLSULFATE 10 MG/10ML IV SOLN
INTRAVENOUS | Status: DC | PRN
  Administered 2016-10-11: 4 mg via INTRAVENOUS

## 2016-10-11 MED ORDER — FAMOTIDINE 20 MG PO TABS
20.0000 mg | ORAL_TABLET | Freq: Once | ORAL | Status: AC
  Administered 2016-10-11: 20 mg via ORAL

## 2016-10-11 MED ORDER — PHENYLEPHRINE HCL 10 MG/ML IJ SOLN
INTRAMUSCULAR | Status: DC | PRN
  Administered 2016-10-11 (×3): 100 ug via INTRAVENOUS

## 2016-10-11 MED ORDER — DEXAMETHASONE SODIUM PHOSPHATE 10 MG/ML IJ SOLN
INTRAMUSCULAR | Status: AC
  Filled 2016-10-11: qty 1

## 2016-10-11 MED ORDER — SODIUM CHLORIDE 0.9 % IV SOLN: INTRAVENOUS | Status: DC | PRN

## 2016-10-11 MED ORDER — FENTANYL CITRATE (PF) 100 MCG/2ML IJ SOLN
INTRAMUSCULAR | Status: AC
  Filled 2016-10-11: qty 2

## 2016-10-11 MED ORDER — DEXAMETHASONE SODIUM PHOSPHATE 10 MG/ML IJ SOLN
INTRAMUSCULAR | Status: DC | PRN
  Administered 2016-10-11: 5 mg via INTRAVENOUS

## 2016-10-11 MED ORDER — PROPOFOL 10 MG/ML IV BOLUS
INTRAVENOUS | Status: AC
  Filled 2016-10-11: qty 20

## 2016-10-11 MED ORDER — SUCCINYLCHOLINE CHLORIDE 20 MG/ML IJ SOLN
INTRAMUSCULAR | Status: DC | PRN
  Administered 2016-10-11: 100 mg via INTRAVENOUS

## 2016-10-11 MED ORDER — LIDOCAINE HCL (PF) 1 % IJ SOLN
INTRAMUSCULAR | Status: AC
  Filled 2016-10-11: qty 2

## 2016-10-11 MED ORDER — HYDROCODONE-ACETAMINOPHEN 5-325 MG PO TABS: 1.0000 | ORAL_TABLET | Freq: Four times a day (QID) | ORAL | 0 refills | Status: DC | PRN

## 2016-10-11 MED ORDER — ONDANSETRON HCL 4 MG/2ML IJ SOLN
INTRAMUSCULAR | Status: AC
  Filled 2016-10-11: qty 2

## 2016-10-11 MED ORDER — ONDANSETRON HCL 4 MG/2ML IJ SOLN
INTRAMUSCULAR | Status: DC | PRN
  Administered 2016-10-11: 4 mg via INTRAVENOUS

## 2016-10-11 MED ORDER — FENTANYL CITRATE (PF) 100 MCG/2ML IJ SOLN
INTRAMUSCULAR | Status: AC
  Administered 2016-10-11: 25 ug via INTRAVENOUS
  Filled 2016-10-11: qty 2

## 2016-10-11 MED ORDER — ROCURONIUM BROMIDE 50 MG/5ML IV SOLN
INTRAVENOUS | Status: AC
  Filled 2016-10-11: qty 1

## 2016-10-11 MED ORDER — FAMOTIDINE 20 MG PO TABS
ORAL_TABLET | ORAL | Status: AC
  Administered 2016-10-11: 20 mg via ORAL
  Filled 2016-10-11: qty 1

## 2016-10-11 MED ORDER — FENTANYL CITRATE (PF) 100 MCG/2ML IJ SOLN
25.0000 ug | INTRAMUSCULAR | Status: DC | PRN
  Administered 2016-10-11: 25 ug via INTRAVENOUS

## 2016-10-11 MED ORDER — FENTANYL CITRATE (PF) 100 MCG/2ML IJ SOLN
INTRAMUSCULAR | Status: DC | PRN
  Administered 2016-10-11: 50 ug via INTRAVENOUS

## 2016-10-11 MED ORDER — SODIUM CHLORIDE 0.9 % IV SOLN
INTRAVENOUS | Status: DC
  Administered 2016-10-11: 10:00:00 via INTRAVENOUS

## 2016-10-11 MED ORDER — MIDAZOLAM HCL 2 MG/2ML IJ SOLN
INTRAMUSCULAR | Status: DC | PRN
  Administered 2016-10-11: 2 mg via INTRAVENOUS

## 2016-10-11 MED ORDER — CLINDAMYCIN PHOSPHATE 300 MG/50ML IV SOLN
INTRAVENOUS | Status: AC
  Administered 2016-10-11: 300 mg via INTRAVENOUS
  Filled 2016-10-11: qty 50

## 2016-10-11 MED ORDER — ONDANSETRON HCL 4 MG/2ML IJ SOLN: 4.0000 mg | Freq: Once | INTRAMUSCULAR | Status: DC | PRN

## 2016-10-11 MED ORDER — ROCURONIUM BROMIDE 100 MG/10ML IV SOLN
INTRAVENOUS | Status: DC | PRN
  Administered 2016-10-11: 30 mg via INTRAVENOUS

## 2016-10-11 MED ORDER — PROPOFOL 10 MG/ML IV BOLUS
INTRAVENOUS | Status: DC | PRN
  Administered 2016-10-11: 150 mg via INTRAVENOUS

## 2016-10-11 MED ORDER — MIDAZOLAM HCL 2 MG/2ML IJ SOLN
INTRAMUSCULAR | Status: AC
  Filled 2016-10-11: qty 2

## 2016-10-11 MED ORDER — GLYCOPYRROLATE 0.2 MG/ML IJ SOLN
INTRAMUSCULAR | Status: DC | PRN
  Administered 2016-10-11: .8 mg via INTRAVENOUS

## 2016-10-11 MED ORDER — LIDOCAINE HCL (PF) 2 % IJ SOLN
INTRAMUSCULAR | Status: AC
  Filled 2016-10-11: qty 2

## 2016-10-11 MED ORDER — LIDOCAINE HCL (CARDIAC) 20 MG/ML IV SOLN
INTRAVENOUS | Status: DC | PRN
  Administered 2016-10-11: 50 mg via INTRAVENOUS

## 2016-10-11 SURGICAL SUPPLY — 32 items
ADAPTER BETA CAP QUINTON DIALY (ADAPTER) ×3 IMPLANT
ADAPTER CATH DIALYSIS 18.75 (CATHETERS) IMPLANT
ADAPTER CATH DIALYSIS 18.75CM (CATHETERS)
CANISTER SUCT 1200ML W/VALVE (MISCELLANEOUS) ×3 IMPLANT
CATH DLYS SWAN NECK 62.5CM (CATHETERS) IMPLANT
CHLORAPREP W/TINT 26ML (MISCELLANEOUS) ×3 IMPLANT
DERMABOND ADVANCED (GAUZE/BANDAGES/DRESSINGS) ×2
DERMABOND ADVANCED .7 DNX12 (GAUZE/BANDAGES/DRESSINGS) ×1 IMPLANT
ELECT CAUTERY BLADE 6.4 (BLADE) ×3 IMPLANT
ELECT REM PT RETURN 9FT ADLT (ELECTROSURGICAL) ×3
ELECTRODE REM PT RTRN 9FT ADLT (ELECTROSURGICAL) ×1 IMPLANT
GLOVE BIO SURGEON STRL SZ7 (GLOVE) ×6 IMPLANT
GLOVE INDICATOR 7.5 STRL GRN (GLOVE) ×3 IMPLANT
GOWN STRL REUS W/ TWL LRG LVL3 (GOWN DISPOSABLE) ×2 IMPLANT
GOWN STRL REUS W/ TWL XL LVL3 (GOWN DISPOSABLE) ×1 IMPLANT
GOWN STRL REUS W/TWL LRG LVL3 (GOWN DISPOSABLE) ×4
GOWN STRL REUS W/TWL XL LVL3 (GOWN DISPOSABLE) ×2
IV NS 500ML (IV SOLUTION) ×2
IV NS 500ML BAXH (IV SOLUTION) ×1 IMPLANT
KIT RM TURNOVER STRD PROC AR (KITS) ×3 IMPLANT
LABEL OR SOLS (LABEL) ×3 IMPLANT
MINICAP W/POVIDONE IODINE SOL (MISCELLANEOUS) IMPLANT
PACK LAP CHOLECYSTECTOMY (MISCELLANEOUS) ×3 IMPLANT
PENCIL ELECTRO HAND CTR (MISCELLANEOUS) ×3 IMPLANT
SET CYSTO W/LG BORE CLAMP LF (SET/KITS/TRAYS/PACK) ×3 IMPLANT
SET TRANSFER 6 W/TWIST CLAMP 5 (SET/KITS/TRAYS/PACK) IMPLANT
SPONGE VERSALON 4X4 4PLY (MISCELLANEOUS) ×3 IMPLANT
SUT MNCRL AB 4-0 PS2 18 (SUTURE) ×3 IMPLANT
SUT VIC AB 2-0 UR6 27 (SUTURE) ×3 IMPLANT
SUT VICRYL+ 3-0 36IN CT-1 (SUTURE) ×3 IMPLANT
TROCAR XCEL NON-BLD 11X100MML (ENDOMECHANICALS) ×3 IMPLANT
TUBING INSUFFLATOR HI FLOW (MISCELLANEOUS) ×3 IMPLANT

## 2016-10-11 NOTE — Anesthesia Procedure Notes (Signed)
Procedure Name: Intubation Performed by: Rolla Plate Pre-anesthesia Checklist: Patient identified, Patient being monitored, Timeout performed, Emergency Drugs available and Suction available Patient Re-evaluated:Patient Re-evaluated prior to inductionOxygen Delivery Method: Circle system utilized Preoxygenation: Pre-oxygenation with 100% oxygen Intubation Type: IV induction and Rapid sequence Laryngoscope Size: Miller and 2 Grade View: Grade I Tube type: Oral Tube size: 7.0 mm Number of attempts: 1 Airway Equipment and Method: Stylet Placement Confirmation: ETT inserted through vocal cords under direct vision,  positive ETCO2 and breath sounds checked- equal and bilateral Secured at: 20 cm Tube secured with: Tape Dental Injury: Teeth and Oropharynx as per pre-operative assessment

## 2016-10-11 NOTE — H&P (Signed)
[facility] History & Physical Update  The patient was interviewed and re-examined.  The patient's previous History and Physical has been reviewed and is unchanged.  There is no change in the plan of care. We plan to proceed with the scheduled procedure with PD catheter revision.  Leotis Pain, MD  10/11/2016, 11:16 AM

## 2016-10-11 NOTE — Transfer of Care (Signed)
Immediate Anesthesia Transfer of Care Note  Patient: Bethany Lee  Procedure(s) Performed: Procedure(s): LAPAROSCOPIC INSERTION CONTINUOUS AMBULATORY PERITONEAL DIALYSIS  (CAPD) CATHETER ( REVISION ) (N/A)  Patient Location: PACU  Anesthesia Type:General  Level of Consciousness: sedated  Airway & Oxygen Therapy: Patient Spontanous Breathing and Patient connected to face mask oxygen  Post-op Assessment: Report given to RN and Post -op Vital signs reviewed and stable  Post vital signs: Reviewed and stable  Last Vitals:  Vitals:   10/11/16 0930 10/11/16 1239  BP: 138/71 113/66  Pulse: 74 71  Resp: 16 19  Temp: 36.3 C     Last Pain:  Vitals:   10/11/16 0930  TempSrc: Tympanic         Complications: No apparent anesthesia complications

## 2016-10-11 NOTE — Anesthesia Postprocedure Evaluation (Signed)
Anesthesia Post Note  Patient: Bethany Lee  Procedure(s) Performed: Procedure(s) (LRB): LAPAROSCOPIC INSERTION CONTINUOUS AMBULATORY PERITONEAL DIALYSIS  (CAPD) CATHETER ( REVISION ) (N/A)  Patient location during evaluation: PACU Anesthesia Type: General Level of consciousness: awake Pain management: pain level controlled Vital Signs Assessment: post-procedure vital signs reviewed and stable Respiratory status: spontaneous breathing Cardiovascular status: stable Anesthetic complications: no     Last Vitals:  Vitals:   10/11/16 1254 10/11/16 1310  BP: 126/69 138/71  Pulse: 72 69  Resp: 16 12  Temp:      Last Pain:  Vitals:   10/11/16 1310  TempSrc:   PainSc: 0-No pain                 VAN STAVEREN,Essam Lowdermilk

## 2016-10-11 NOTE — Anesthesia Post-op Follow-up Note (Cosign Needed)
Anesthesia QCDR form completed.        

## 2016-10-11 NOTE — Op Note (Addendum)
  OPERATIVE NOTE   PROCEDURE: 1. Laparoscopic peritoneal dialysis catheter revision.  PRE-OPERATIVE DIAGNOSIS: 1. ESRD 2. Nonfunctional PD catheter  POST-OPERATIVE DIAGNOSIS: Same  SURGEON: Leotis Pain, MD  ASSISTANT(S): None  ANESTHESIA: general  ESTIMATED BLOOD LOSS: Minimal   FINDING(S): 1. catheter pulled into left upper abdomen by the omentum  SPECIMEN(S): None  INDICATIONS:  Patient presents with ESRD and a nonfunctional peritoneal dialysis catheter. The patient has decided to do peritoneal dialysis for their long-term dialysis and with this catheter not working, revision is indicated. Risks and benefits of placement were discussed and she is agreeable to proceed.  Differences between peritoneal dialysis and hemodialysis were discussed.    DESCRIPTION: After obtaining full informed written consent, the patient was brought back to the operating room and placed supine upon the operating table. The patient received IV antibiotics prior to induction. After obtaining adequate anesthesia, the abdomen was prepped and draped in the standard fashion. I then entered the peritoneum with an 18mm Optiview trocar placed in the right upper quadrant and insufflated the abdomen with carbon dioxide. I then entered the peritoneum in the left upper quadrant with a 5 mm trocar with a trocar under direct visualization. The catheter was found to be in the left upper quadrant encased in the omentum which had likely pulled the catheter out of the pelvis. I then used a blunt grasper to get the catheter which freed easily from the omentum. It was then parked down into the pelvis without any tension and excellent location under direct visualization. I then placed 500 cc of saline through the catheter into the pelvis. The abdomen was desufflated. Immediately, 400 cc of effluent returned through the catheter when the bag was placed to gravity. I took one more look with the camera to ensure that the catheter  was in the pelvis and it was. The 84mm trocar was then removed. The 5 mm trocar was removed. I then closed the incisions with 2-0 Vicryl and 4-0 Monocryl and placed Dermabond as dressing. Dry dressing was placed around the catheter exit site. The patient was then awakened from anesthesia and taken to the recovery room in stable condition having tolerated the procedure well.  COMPLICATIONS: None  CONDITION: None  Leotis Pain, MD 10/11/2016 12:27 PM   This note was created with Dragon Medical transcription system. Any errors in dictation are purely unintentional.

## 2016-10-11 NOTE — Discharge Instructions (Signed)

## 2016-10-11 NOTE — Anesthesia Preprocedure Evaluation (Signed)
Anesthesia Evaluation  Patient identified by MRN, date of birth, ID band Patient awake    Reviewed: Allergy & Precautions, NPO status , Patient's Chart, lab work & pertinent test results  History of Anesthesia Complications (+) PONV  Airway Mallampati: III  TM Distance: <3 FB Neck ROM: Full    Dental  (+) Upper Dentures, Lower Dentures   Pulmonary former smoker,     + decreased breath sounds      Cardiovascular hypertension, Pt. on home beta blockers + CAD and +CHF   Rhythm:Regular Rate:Normal     Neuro/Psych negative neurological ROS     GI/Hepatic negative GI ROS, Neg liver ROS,   Endo/Other  diabetes, Type 1, Insulin DependentHypothyroidism   Renal/GU DialysisRenal disease     Musculoskeletal   Abdominal (+) + obese,   Peds  Hematology   Anesthesia Other Findings   Reproductive/Obstetrics                             Anesthesia Physical Anesthesia Plan  ASA: III  Anesthesia Plan: General   Post-op Pain Management:    Induction: Intravenous  Airway Management Planned: Oral ETT  Additional Equipment:   Intra-op Plan:   Post-operative Plan: Extubation in OR  Informed Consent: I have reviewed the patients History and Physical, chart, labs and discussed the procedure including the risks, benefits and alternatives for the proposed anesthesia with the patient or authorized representative who has indicated his/her understanding and acceptance.     Plan Discussed with: CRNA  Anesthesia Plan Comments:         Anesthesia Quick Evaluation

## 2016-10-12 ENCOUNTER — Encounter: Payer: Self-pay | Admitting: Vascular Surgery

## 2016-10-12 DIAGNOSIS — Z992 Dependence on renal dialysis: Secondary | ICD-10-CM | POA: Diagnosis not present

## 2016-10-12 DIAGNOSIS — N186 End stage renal disease: Secondary | ICD-10-CM | POA: Diagnosis not present

## 2016-10-14 DIAGNOSIS — Z992 Dependence on renal dialysis: Secondary | ICD-10-CM | POA: Diagnosis not present

## 2016-10-14 DIAGNOSIS — N186 End stage renal disease: Secondary | ICD-10-CM | POA: Diagnosis not present

## 2016-10-17 DIAGNOSIS — Z992 Dependence on renal dialysis: Secondary | ICD-10-CM | POA: Diagnosis not present

## 2016-10-17 DIAGNOSIS — N186 End stage renal disease: Secondary | ICD-10-CM | POA: Diagnosis not present

## 2016-10-19 DIAGNOSIS — N186 End stage renal disease: Secondary | ICD-10-CM | POA: Diagnosis not present

## 2016-10-19 DIAGNOSIS — Z992 Dependence on renal dialysis: Secondary | ICD-10-CM | POA: Diagnosis not present

## 2016-10-21 DIAGNOSIS — Z992 Dependence on renal dialysis: Secondary | ICD-10-CM | POA: Diagnosis not present

## 2016-10-21 DIAGNOSIS — N186 End stage renal disease: Secondary | ICD-10-CM | POA: Diagnosis not present

## 2016-10-24 DIAGNOSIS — N186 End stage renal disease: Secondary | ICD-10-CM | POA: Diagnosis not present

## 2016-10-24 DIAGNOSIS — Z992 Dependence on renal dialysis: Secondary | ICD-10-CM | POA: Diagnosis not present

## 2016-10-25 DIAGNOSIS — Z992 Dependence on renal dialysis: Secondary | ICD-10-CM | POA: Diagnosis not present

## 2016-10-25 DIAGNOSIS — N186 End stage renal disease: Secondary | ICD-10-CM | POA: Diagnosis not present

## 2016-10-26 DIAGNOSIS — Z992 Dependence on renal dialysis: Secondary | ICD-10-CM | POA: Diagnosis not present

## 2016-10-26 DIAGNOSIS — N186 End stage renal disease: Secondary | ICD-10-CM | POA: Diagnosis not present

## 2016-10-27 ENCOUNTER — Encounter (INDEPENDENT_AMBULATORY_CARE_PROVIDER_SITE_OTHER): Payer: Self-pay

## 2016-10-27 DIAGNOSIS — N186 End stage renal disease: Secondary | ICD-10-CM | POA: Diagnosis not present

## 2016-10-27 DIAGNOSIS — Z992 Dependence on renal dialysis: Secondary | ICD-10-CM | POA: Diagnosis not present

## 2016-10-28 DIAGNOSIS — N186 End stage renal disease: Secondary | ICD-10-CM | POA: Diagnosis not present

## 2016-10-28 DIAGNOSIS — Z992 Dependence on renal dialysis: Secondary | ICD-10-CM | POA: Diagnosis not present

## 2016-10-29 DIAGNOSIS — Z992 Dependence on renal dialysis: Secondary | ICD-10-CM | POA: Diagnosis not present

## 2016-10-29 DIAGNOSIS — N186 End stage renal disease: Secondary | ICD-10-CM | POA: Diagnosis not present

## 2016-10-30 DIAGNOSIS — Z992 Dependence on renal dialysis: Secondary | ICD-10-CM | POA: Diagnosis not present

## 2016-10-30 DIAGNOSIS — N186 End stage renal disease: Secondary | ICD-10-CM | POA: Diagnosis not present

## 2016-10-31 DIAGNOSIS — Z992 Dependence on renal dialysis: Secondary | ICD-10-CM | POA: Diagnosis not present

## 2016-10-31 DIAGNOSIS — Z01818 Encounter for other preprocedural examination: Secondary | ICD-10-CM | POA: Diagnosis not present

## 2016-10-31 DIAGNOSIS — R188 Other ascites: Secondary | ICD-10-CM | POA: Diagnosis not present

## 2016-10-31 DIAGNOSIS — Z794 Long term (current) use of insulin: Secondary | ICD-10-CM | POA: Diagnosis not present

## 2016-10-31 DIAGNOSIS — E1122 Type 2 diabetes mellitus with diabetic chronic kidney disease: Secondary | ICD-10-CM | POA: Diagnosis not present

## 2016-10-31 DIAGNOSIS — N186 End stage renal disease: Secondary | ICD-10-CM | POA: Diagnosis not present

## 2016-10-31 DIAGNOSIS — R93429 Abnormal radiologic findings on diagnostic imaging of unspecified kidney: Secondary | ICD-10-CM | POA: Diagnosis not present

## 2016-11-01 ENCOUNTER — Encounter: Admission: RE | Payer: Self-pay | Source: Ambulatory Visit

## 2016-11-01 ENCOUNTER — Ambulatory Visit: Admission: RE | Admit: 2016-11-01 | Payer: Medicare HMO | Source: Ambulatory Visit | Admitting: Vascular Surgery

## 2016-11-01 DIAGNOSIS — Z992 Dependence on renal dialysis: Secondary | ICD-10-CM | POA: Diagnosis not present

## 2016-11-01 DIAGNOSIS — N186 End stage renal disease: Secondary | ICD-10-CM | POA: Diagnosis not present

## 2016-11-01 SURGERY — DIALYSIS/PERMA CATHETER REMOVAL
Anesthesia: Moderate Sedation

## 2016-11-02 ENCOUNTER — Encounter (INDEPENDENT_AMBULATORY_CARE_PROVIDER_SITE_OTHER): Payer: Self-pay | Admitting: Vascular Surgery

## 2016-11-02 ENCOUNTER — Other Ambulatory Visit (INDEPENDENT_AMBULATORY_CARE_PROVIDER_SITE_OTHER): Payer: Self-pay | Admitting: Vascular Surgery

## 2016-11-02 ENCOUNTER — Ambulatory Visit (INDEPENDENT_AMBULATORY_CARE_PROVIDER_SITE_OTHER): Payer: Medicare HMO | Admitting: Vascular Surgery

## 2016-11-02 ENCOUNTER — Other Ambulatory Visit (INDEPENDENT_AMBULATORY_CARE_PROVIDER_SITE_OTHER): Payer: Medicare HMO

## 2016-11-02 VITALS — BP 184/84 | HR 84 | Resp 16 | Ht 60.5 in | Wt 145.0 lb

## 2016-11-02 DIAGNOSIS — N186 End stage renal disease: Secondary | ICD-10-CM | POA: Diagnosis not present

## 2016-11-02 DIAGNOSIS — Z01810 Encounter for preprocedural cardiovascular examination: Secondary | ICD-10-CM

## 2016-11-02 DIAGNOSIS — E118 Type 2 diabetes mellitus with unspecified complications: Secondary | ICD-10-CM

## 2016-11-02 DIAGNOSIS — E119 Type 2 diabetes mellitus without complications: Secondary | ICD-10-CM | POA: Diagnosis not present

## 2016-11-02 DIAGNOSIS — Z794 Long term (current) use of insulin: Secondary | ICD-10-CM | POA: Diagnosis not present

## 2016-11-02 DIAGNOSIS — Z992 Dependence on renal dialysis: Secondary | ICD-10-CM

## 2016-11-03 ENCOUNTER — Encounter (INDEPENDENT_AMBULATORY_CARE_PROVIDER_SITE_OTHER): Payer: Medicare HMO | Admitting: Vascular Surgery

## 2016-11-03 DIAGNOSIS — N186 End stage renal disease: Secondary | ICD-10-CM | POA: Diagnosis not present

## 2016-11-03 DIAGNOSIS — Z992 Dependence on renal dialysis: Secondary | ICD-10-CM | POA: Diagnosis not present

## 2016-11-04 DIAGNOSIS — Z992 Dependence on renal dialysis: Secondary | ICD-10-CM | POA: Diagnosis not present

## 2016-11-04 DIAGNOSIS — N186 End stage renal disease: Secondary | ICD-10-CM | POA: Diagnosis not present

## 2016-11-05 DIAGNOSIS — N186 End stage renal disease: Secondary | ICD-10-CM | POA: Diagnosis not present

## 2016-11-05 DIAGNOSIS — Z992 Dependence on renal dialysis: Secondary | ICD-10-CM | POA: Diagnosis not present

## 2016-11-06 DIAGNOSIS — N186 End stage renal disease: Secondary | ICD-10-CM | POA: Diagnosis not present

## 2016-11-06 DIAGNOSIS — Z992 Dependence on renal dialysis: Secondary | ICD-10-CM | POA: Diagnosis not present

## 2016-11-07 ENCOUNTER — Telehealth (INDEPENDENT_AMBULATORY_CARE_PROVIDER_SITE_OTHER): Payer: Self-pay

## 2016-11-07 DIAGNOSIS — Z992 Dependence on renal dialysis: Secondary | ICD-10-CM | POA: Diagnosis not present

## 2016-11-07 DIAGNOSIS — N186 End stage renal disease: Secondary | ICD-10-CM | POA: Diagnosis not present

## 2016-11-07 NOTE — Telephone Encounter (Signed)
Attempted to contact patient to schedule surgery, I left a message for her to return my call to be scheduled.

## 2016-11-08 ENCOUNTER — Encounter (INDEPENDENT_AMBULATORY_CARE_PROVIDER_SITE_OTHER): Payer: Medicare HMO | Admitting: Vascular Surgery

## 2016-11-08 DIAGNOSIS — N186 End stage renal disease: Secondary | ICD-10-CM | POA: Diagnosis not present

## 2016-11-08 DIAGNOSIS — Z992 Dependence on renal dialysis: Secondary | ICD-10-CM | POA: Diagnosis not present

## 2016-11-09 DIAGNOSIS — N186 End stage renal disease: Secondary | ICD-10-CM | POA: Diagnosis not present

## 2016-11-09 DIAGNOSIS — Z992 Dependence on renal dialysis: Secondary | ICD-10-CM | POA: Diagnosis not present

## 2016-11-10 DIAGNOSIS — Z992 Dependence on renal dialysis: Secondary | ICD-10-CM | POA: Diagnosis not present

## 2016-11-10 DIAGNOSIS — N186 End stage renal disease: Secondary | ICD-10-CM | POA: Diagnosis not present

## 2016-11-11 DIAGNOSIS — Z992 Dependence on renal dialysis: Secondary | ICD-10-CM | POA: Diagnosis not present

## 2016-11-11 DIAGNOSIS — N186 End stage renal disease: Secondary | ICD-10-CM | POA: Diagnosis not present

## 2016-11-12 DIAGNOSIS — Z992 Dependence on renal dialysis: Secondary | ICD-10-CM | POA: Diagnosis not present

## 2016-11-12 DIAGNOSIS — N186 End stage renal disease: Secondary | ICD-10-CM | POA: Diagnosis not present

## 2016-11-13 DIAGNOSIS — Z992 Dependence on renal dialysis: Secondary | ICD-10-CM | POA: Diagnosis not present

## 2016-11-13 DIAGNOSIS — N186 End stage renal disease: Secondary | ICD-10-CM | POA: Diagnosis not present

## 2016-11-13 NOTE — Progress Notes (Signed)
Subjective:    Patient ID: Bethany Lee, female    DOB: [birth year], 66 y.o.   MRN: [id number] Chief Complaint  Patient presents with  . Routine Post Op    Follow up   Patient presents to discuss placing an access for hemodialysis. She is currently on PD. She has had some complication with the PD catheter requiring revisions. She currently has a right permcath in place. Her PD catheter is functioning however it does give her some issues. The machine will alert at night when she is sleeping and she will have change position for it to start working again. She would like a access placed as a backup so when her PD is not functioning she does not have to have a permcath placed in her chest. The patient underwent bilateral vein mapping which was amenable for an exploration and possible creation of a left brachiocephalic AV fistula. She is right handed.    Review of Systems  Constitutional: Negative.   HENT: Negative.   Eyes: Negative.   Respiratory: Negative.   Cardiovascular: Negative.   Gastrointestinal: Negative.   Endocrine: Negative.   Genitourinary:       ESRD  Musculoskeletal: Negative.   Skin: Negative.   Allergic/Immunologic: Negative.   Neurological: Negative.   Hematological: Negative.   Psychiatric/Behavioral: Negative.       Objective:   Physical Exam  Constitutional: She is oriented to person, place, and time. She appears well-developed and well-nourished. No distress.  HENT:  Head: Normocephalic and atraumatic.  Right Ear: External ear normal.  Left Ear: External ear normal.  Eyes: Conjunctivae are normal. Pupils are equal, round, and reactive to light.  Neck: Normal range of motion.  Cardiovascular: Normal rate, regular rhythm, normal heart sounds and intact distal pulses.   Pulses:      Radial pulses are 2+ on the right side, and 2+ on the left side.  Pulmonary/Chest: Effort normal.  Right Permcath: Intact. No infection noted.   Abdominal: Soft.  PD  Catheter: Intact. No infection noted.   Musculoskeletal: Normal range of motion. She exhibits no edema.  Neurological: She is alert and oriented to person, place, and time.  Skin: Skin is warm and dry. She is not diaphoretic.  Psychiatric: She has a normal mood and affect. Her behavior is normal. Judgment and thought content normal.  Vitals reviewed.  BP (!) 184/84 (BP Location: Right Arm)   Pulse 84   Resp 16   Ht 5' 0.5" (1.537 m)   Wt 145 lb (65.8 kg)   BMI 27.85 kg/m   Past Medical History:  Diagnosis Date  . Anemia   . CHF (congestive heart failure) ([place])   . Chronic kidney disease    peritoneal dialysis  . Complication of anesthesia   . Coronary artery disease   . Diabetes mellitus without complication ([place])   . Dyspnea    doe  . Edema   . Hypercholesterolemia   . Hypertension   . Hypothyroidism   . Neuropathy   . PONV (postoperative nausea and vomiting)   . Shingles    3 weeks ago  . Tremors of nervous system    intermittent when taking gabapentin   Social History   Social History  . Marital status: Married    Spouse name: N/A  . Number of children: N/A  . Years of education: N/A   Occupational History  . Not on file.   Social History Main Topics  . Smoking status: Former Smoker  Quit date: 10/09/2008  . Smokeless tobacco: Never Used  . Alcohol use No  . Drug use: No  . Sexual activity: Yes   Other Topics Concern  . Not on file   Social History Narrative  . No narrative on file   Past Surgical History:  Procedure Laterality Date  . ABDOMINAL HYSTERECTOMY    . CAPD INSERTION N/A 11/25/2015   Procedure: LAPAROSCOPIC INSERTION CONTINUOUS AMBULATORY PERITONEAL DIALYSIS  (CAPD) CATHETER;  Surgeon: Algernon Huxley, MD;  Location: [facility];  Service: Vascular;  Laterality: N/A;  . CAPD INSERTION N/A 01/06/2016   Procedure: LAPAROSCOPIC INSERTION CONTINUOUS AMBULATORY PERITONEAL DIALYSIS  (CAPD) CATHETER REVISION ;  Surgeon: Algernon Huxley, MD;  Location:  [facility];  Service: Vascular;  Laterality: N/A;  . CAPD INSERTION N/A 10/11/2016   Procedure: LAPAROSCOPIC INSERTION CONTINUOUS AMBULATORY PERITONEAL DIALYSIS  (CAPD) CATHETER ( REVISION );  Surgeon: Algernon Huxley, MD;  Location: [facility];  Service: Vascular;  Laterality: N/A;  . CATARACT EXTRACTION W/PHACO Right 05/25/2016   Procedure: CATARACT EXTRACTION PHACO AND INTRAOCULAR LENS PLACEMENT ([place]);  Surgeon: Eulogio Bear, MD;  Location: [facility];  Service: Ophthalmology;  Laterality: Right;  Lot # [id number] H [place]: 00:5.2 AP%: 11.3 CDE: 7.21  . CORONARY STENT INTERVENTION N/A 09/07/2016   Procedure: Coronary Stent Intervention;  Surgeon: Isaias Cowman, MD;  Location: [place] [facility];  Service: Cardiovascular;  Laterality: N/A;  . DIALYSIS/PERMA CATHETER INSERTION N/A 09/21/2016   Procedure: Dialysis/Perma Catheter Insertion;  Surgeon: Algernon Huxley, MD;  Location: [place] [facility];  Service: Cardiovascular;  Laterality: N/A;  . EYE SURGERY    . LEFT HEART CATH AND CORONARY ANGIOGRAPHY Left 09/07/2016   Procedure: Left Heart Cath and Coronary Angiography;  Surgeon: Isaias Cowman, MD;  Location: [place] [facility];  Service: Cardiovascular;  Laterality: Left;  . TUBAL LIGATION     No family history on file.  Allergies  Allergen Reactions  . Penicillins     Childhood loss of consciousness  Has patient had a PCN reaction causing immediate rash, facial/tongue/throat swelling, SOB or lightheadedness with hypotension: Yes Has patient had a PCN reaction causing severe rash involving mucus membranes or skin necrosis: No Has patient had a PCN reaction that required hospitalization Unknown Has patient had a PCN reaction occurring within the last 10 years: Unknown If all of the above answers are "NO", then may proceed with Cephalosporin use.   . Valacyclovir Nausea Only      Assessment & Plan:  Patient presents to discuss placing an access for hemodialysis. She is currently on PD.  She has had some complication with the PD catheter requiring revisions. She currently has a right permcath in place. Her PD catheter is functioning however it does give her some issues. The machine will alert at night when she is sleeping and she will have change position for it to start working again. She would like a access placed as a backup so when her PD is not functioning she does not have to have a permcath placed in her chest. The patient underwent bilateral vein mapping which was amenable for an exploration and possible creation of a left brachiocephalic AV fistula. She is right handed.   1. Type 2 diabetes mellitus with complication, unspecified whether long term insulin use (HCC) - Stable Encouraged good control as its slows the progression of atherosclerotic disease  2. ESRD on dialysis ([place]) - Stable Patient primary access is PD. This may not last in the long  run as the patient continues to have issues requiring revision. Permcath is not a permanent option. Vein mapping with possible creation on left brachiocephalic AV fistula creation. Will plan to explore and hopefully create fistula. If not, patient will have to undergo graft creation when she decides to stop using PD for dialysis as we do not place grafts proplectically.  The patient and her husband understand this.   Current Outpatient Prescriptions on File Prior to Visit  Medication Sig Dispense Refill  . amLODipine (NORVASC) 5 MG tablet Take 5 mg by mouth daily.    Marland Kitchen aspirin EC 81 MG tablet Take 1 tablet (81 mg total) by mouth daily. Start taking after NOV 15 (Patient taking differently: Take 81 mg by mouth daily. ) 30 tablet 0  . calcium carbonate (TUMS - DOSED IN MG ELEMENTAL CALCIUM) 500 MG chewable tablet Chew 2 tablets by mouth daily.    . carvedilol (COREG) 12.5 MG tablet Take 1 tablet (12.5 mg total) by mouth 2 (two) times daily with a meal. 60 tablet 0  . clopidogrel (PLAVIX) 75 MG tablet Take 1 tablet (75 mg total) by  mouth daily with breakfast. 30 tablet 6  . dextromethorphan (DELSYM) 30 MG/5ML liquid Take 60 mg by mouth at bedtime.    . furosemide (LASIX) 80 MG tablet Take 80 mg by mouth 2 (two) times daily.    Marland Kitchen gentamicin cream (GARAMYCIN) 0.1 % Apply 1 application topically daily. (Patient taking differently: Apply 1 application topically daily. Catheter dressing changes) 15 g 0  . hydrALAZINE (APRESOLINE) 50 MG tablet Take 50 mg by mouth 2 (two) times daily.    Marland Kitchen HYDROcodone-acetaminophen (NORCO) 5-325 MG tablet Take 1 tablet by mouth every 6 (six) hours as needed for moderate pain. 30 tablet 0  . insulin aspart (NOVOLOG FLEXPEN) 100 UNIT/ML FlexPen Inject 4-8 Units into the skin 3 (three) times daily with meals. Sliding scale below 200 take 4 units, 200-250=6 units, >250= 8 units    . Insulin Glargine (TOUJEO SOLOSTAR) 300 UNIT/ML SOPN Inject 20 Units into the skin at bedtime. (Patient taking differently: Inject 24 Units into the skin at bedtime. ) 30 pen 5  . levothyroxine (SYNTHROID, LEVOTHROID) 75 MCG tablet Take 75 mcg by mouth daily before breakfast.    . lisinopril (PRINIVIL,ZESTRIL) 10 MG tablet Take 5 mg by mouth daily.     Marland Kitchen lovastatin (MEVACOR) 40 MG tablet Take 40 mg by mouth daily with supper.     . sevelamer carbonate (RENVELA) 2.4 g PACK Take 2.4 g by mouth 3 (three) times daily with meals.    . vitamin B-12 (CYANOCOBALAMIN) 1000 MCG tablet Take 1,000 mcg by mouth daily.     No current facility-administered medications on file prior to visit.    There are no Patient Instructions on file for this visit. No Follow-up on file.  Emmalena Canny A Jaeden [place], PA-C

## 2016-11-14 DIAGNOSIS — Z992 Dependence on renal dialysis: Secondary | ICD-10-CM | POA: Diagnosis not present

## 2016-11-14 DIAGNOSIS — N186 End stage renal disease: Secondary | ICD-10-CM | POA: Diagnosis not present

## 2016-11-15 ENCOUNTER — Telehealth (INDEPENDENT_AMBULATORY_CARE_PROVIDER_SITE_OTHER): Payer: Self-pay

## 2016-11-15 DIAGNOSIS — Z992 Dependence on renal dialysis: Secondary | ICD-10-CM | POA: Diagnosis not present

## 2016-11-15 DIAGNOSIS — N186 End stage renal disease: Secondary | ICD-10-CM | POA: Diagnosis not present

## 2016-11-15 NOTE — Telephone Encounter (Signed)
Patient returned my call to get her scheduled for surgery, but she is confused and has questions regarding what is to be done. I attempted to explain that she would have her peritoneal cath revised and possibly a left  AV fistula insertion. She kept saying that her permcath was to be removed, I offered to bring her in to speak with the doctor regarding her questions and confusion and she kept saying no. She stated she would call her nephrologist and call back.

## 2016-11-16 DIAGNOSIS — N186 End stage renal disease: Secondary | ICD-10-CM | POA: Diagnosis not present

## 2016-11-16 DIAGNOSIS — Z992 Dependence on renal dialysis: Secondary | ICD-10-CM | POA: Diagnosis not present

## 2016-11-17 DIAGNOSIS — Z992 Dependence on renal dialysis: Secondary | ICD-10-CM | POA: Diagnosis not present

## 2016-11-17 DIAGNOSIS — N186 End stage renal disease: Secondary | ICD-10-CM | POA: Diagnosis not present

## 2016-11-18 DIAGNOSIS — N186 End stage renal disease: Secondary | ICD-10-CM | POA: Diagnosis not present

## 2016-11-18 DIAGNOSIS — Z992 Dependence on renal dialysis: Secondary | ICD-10-CM | POA: Diagnosis not present

## 2016-11-19 DIAGNOSIS — N186 End stage renal disease: Secondary | ICD-10-CM | POA: Diagnosis not present

## 2016-11-19 DIAGNOSIS — Z992 Dependence on renal dialysis: Secondary | ICD-10-CM | POA: Diagnosis not present

## 2016-11-20 ENCOUNTER — Telehealth (INDEPENDENT_AMBULATORY_CARE_PROVIDER_SITE_OTHER): Payer: Self-pay

## 2016-11-20 DIAGNOSIS — N186 End stage renal disease: Secondary | ICD-10-CM | POA: Diagnosis not present

## 2016-11-20 DIAGNOSIS — Z992 Dependence on renal dialysis: Secondary | ICD-10-CM | POA: Diagnosis not present

## 2016-11-20 NOTE — Telephone Encounter (Signed)
Patient called again wanting to know what was scheduled for her. I explained again that the surgery was for a PD cath revision and possible AV fistula placement. Patient was adamant about only having her permcath removed and that no one was telling her what was going on, again I offered to bring her in again to speak with Dr. Lucky Cowboy or Maudie Mercury the P.A. regarding her surgery. Earlier I spoke with Daphine from the dialysis center and was told  That Judson Roch had spoken with the patient regarding her surgery and she was ready to have it. I let her know to have the patient call me to be scheduled, the patient stated she was calling Dr. Juleen China to find out what was going on.

## 2016-11-21 DIAGNOSIS — N186 End stage renal disease: Secondary | ICD-10-CM | POA: Diagnosis not present

## 2016-11-21 DIAGNOSIS — Z992 Dependence on renal dialysis: Secondary | ICD-10-CM | POA: Diagnosis not present

## 2016-11-22 ENCOUNTER — Encounter (INDEPENDENT_AMBULATORY_CARE_PROVIDER_SITE_OTHER): Payer: Self-pay

## 2016-11-22 DIAGNOSIS — Z992 Dependence on renal dialysis: Secondary | ICD-10-CM | POA: Diagnosis not present

## 2016-11-22 DIAGNOSIS — N186 End stage renal disease: Secondary | ICD-10-CM | POA: Diagnosis not present

## 2016-11-23 DIAGNOSIS — N186 End stage renal disease: Secondary | ICD-10-CM | POA: Diagnosis not present

## 2016-11-23 DIAGNOSIS — Z992 Dependence on renal dialysis: Secondary | ICD-10-CM | POA: Diagnosis not present

## 2016-11-24 ENCOUNTER — Other Ambulatory Visit (INDEPENDENT_AMBULATORY_CARE_PROVIDER_SITE_OTHER): Payer: Self-pay | Admitting: Vascular Surgery

## 2016-11-24 DIAGNOSIS — Z992 Dependence on renal dialysis: Secondary | ICD-10-CM | POA: Diagnosis not present

## 2016-11-24 DIAGNOSIS — N186 End stage renal disease: Secondary | ICD-10-CM | POA: Diagnosis not present

## 2016-11-25 DIAGNOSIS — Z992 Dependence on renal dialysis: Secondary | ICD-10-CM | POA: Diagnosis not present

## 2016-11-25 DIAGNOSIS — N186 End stage renal disease: Secondary | ICD-10-CM | POA: Diagnosis not present

## 2016-11-26 DIAGNOSIS — Z992 Dependence on renal dialysis: Secondary | ICD-10-CM | POA: Diagnosis not present

## 2016-11-26 DIAGNOSIS — N186 End stage renal disease: Secondary | ICD-10-CM | POA: Diagnosis not present

## 2016-11-27 ENCOUNTER — Encounter
Admission: RE | Admit: 2016-11-27 | Discharge: 2016-11-27 | Disposition: A | Discharge status: Home / Self Care | Payer: Medicare HMO | Source: Ambulatory Visit | Attending: Vascular Surgery | Admitting: Vascular Surgery

## 2016-11-27 DIAGNOSIS — Z794 Long term (current) use of insulin: Secondary | ICD-10-CM | POA: Diagnosis not present

## 2016-11-27 DIAGNOSIS — Z9841 Cataract extraction status, right eye: Secondary | ICD-10-CM | POA: Diagnosis not present

## 2016-11-27 DIAGNOSIS — Z90710 Acquired absence of both cervix and uterus: Secondary | ICD-10-CM | POA: Diagnosis not present

## 2016-11-27 DIAGNOSIS — N186 End stage renal disease: Secondary | ICD-10-CM | POA: Diagnosis not present

## 2016-11-27 DIAGNOSIS — E1122 Type 2 diabetes mellitus with diabetic chronic kidney disease: Secondary | ICD-10-CM | POA: Diagnosis not present

## 2016-11-27 DIAGNOSIS — I509 Heart failure, unspecified: Secondary | ICD-10-CM | POA: Diagnosis not present

## 2016-11-27 DIAGNOSIS — I132 Hypertensive heart and chronic kidney disease with heart failure and with stage 5 chronic kidney disease, or end stage renal disease: Secondary | ICD-10-CM | POA: Diagnosis not present

## 2016-11-27 DIAGNOSIS — X58XXXA Exposure to other specified factors, initial encounter: Secondary | ICD-10-CM | POA: Diagnosis not present

## 2016-11-27 DIAGNOSIS — E039 Hypothyroidism, unspecified: Secondary | ICD-10-CM | POA: Diagnosis not present

## 2016-11-27 DIAGNOSIS — Z8619 Personal history of other infectious and parasitic diseases: Secondary | ICD-10-CM | POA: Diagnosis not present

## 2016-11-27 DIAGNOSIS — I5022 Chronic systolic (congestive) heart failure: Secondary | ICD-10-CM | POA: Diagnosis not present

## 2016-11-27 DIAGNOSIS — G63 Polyneuropathy in diseases classified elsewhere: Secondary | ICD-10-CM | POA: Diagnosis not present

## 2016-11-27 DIAGNOSIS — R251 Tremor, unspecified: Secondary | ICD-10-CM | POA: Diagnosis not present

## 2016-11-27 DIAGNOSIS — I2510 Atherosclerotic heart disease of native coronary artery without angina pectoris: Secondary | ICD-10-CM | POA: Diagnosis not present

## 2016-11-27 DIAGNOSIS — D649 Anemia, unspecified: Secondary | ICD-10-CM | POA: Diagnosis not present

## 2016-11-27 DIAGNOSIS — Z888 Allergy status to other drugs, medicaments and biological substances status: Secondary | ICD-10-CM | POA: Diagnosis not present

## 2016-11-27 DIAGNOSIS — T829XXA Unspecified complication of cardiac and vascular prosthetic device, implant and graft, initial encounter: Secondary | ICD-10-CM | POA: Diagnosis not present

## 2016-11-27 DIAGNOSIS — D638 Anemia in other chronic diseases classified elsewhere: Secondary | ICD-10-CM | POA: Diagnosis not present

## 2016-11-27 DIAGNOSIS — Z79899 Other long term (current) drug therapy: Secondary | ICD-10-CM | POA: Diagnosis not present

## 2016-11-27 DIAGNOSIS — E1140 Type 2 diabetes mellitus with diabetic neuropathy, unspecified: Secondary | ICD-10-CM | POA: Diagnosis not present

## 2016-11-27 DIAGNOSIS — Z880 Allergy status to penicillin: Secondary | ICD-10-CM | POA: Diagnosis not present

## 2016-11-27 DIAGNOSIS — I10 Essential (primary) hypertension: Secondary | ICD-10-CM | POA: Diagnosis not present

## 2016-11-27 DIAGNOSIS — Z87891 Personal history of nicotine dependence: Secondary | ICD-10-CM | POA: Diagnosis not present

## 2016-11-27 DIAGNOSIS — Z0000 Encounter for general adult medical examination without abnormal findings: Secondary | ICD-10-CM | POA: Diagnosis not present

## 2016-11-27 DIAGNOSIS — Z992 Dependence on renal dialysis: Secondary | ICD-10-CM | POA: Diagnosis not present

## 2016-11-27 DIAGNOSIS — E782 Mixed hyperlipidemia: Secondary | ICD-10-CM | POA: Diagnosis not present

## 2016-11-27 DIAGNOSIS — Z01818 Encounter for other preprocedural examination: Secondary | ICD-10-CM | POA: Diagnosis not present

## 2016-11-27 DIAGNOSIS — E7800 Pure hypercholesterolemia, unspecified: Secondary | ICD-10-CM | POA: Diagnosis not present

## 2016-11-27 HISTORY — DX: Dependence on renal dialysis: Z99.2

## 2016-11-27 LAB — BASIC METABOLIC PANEL
Anion gap: 14 (ref 5–15)
BUN: 69 mg/dL — ABNORMAL HIGH (ref 6–20)
CALCIUM: 9.4 mg/dL (ref 8.9–10.3)
CHLORIDE: 100 mmol/L — AB (ref 101–111)
CO2: 25 mmol/L (ref 22–32)
Creatinine, Ser: 13.5 mg/dL — ABNORMAL HIGH (ref 0.44–1.00)
GFR calc non Af Amer: 2 mL/min — ABNORMAL LOW (ref >60)
GFR, EST AFRICAN AMERICAN: 3 mL/min — AB (ref >60)
GLUCOSE: 107 mg/dL — AB (ref 65–99)
POTASSIUM: 4.5 mmol/L (ref 3.5–5.1)
Sodium: 139 mmol/L (ref 135–145)

## 2016-11-27 LAB — TYPE AND SCREEN
ABO/RH(D): A POS
Antibody Screen: NEGATIVE

## 2016-11-27 LAB — CBC WITH DIFFERENTIAL/PLATELET
BASOS PCT: 1 %
Basophils Absolute: 0.1 10*3/uL (ref 0–0.1)
Eosinophils Absolute: 0.3 10*3/uL (ref 0–0.7)
Eosinophils Relative: 3 %
HEMATOCRIT: 36.2 % (ref 35.0–47.0)
HEMOGLOBIN: 12.1 g/dL (ref 12.0–16.0)
LYMPHS ABS: 1.5 10*3/uL (ref 1.0–3.6)
LYMPHS PCT: 19 %
MCH: 30.2 pg (ref 26.0–34.0)
MCHC: 33.4 g/dL (ref 32.0–36.0)
MCV: 90.4 fL (ref 80.0–100.0)
MONO ABS: 0.5 10*3/uL (ref 0.2–0.9)
Monocytes Relative: 6 %
NEUTROS ABS: 5.8 10*3/uL (ref 1.4–6.5)
NEUTROS PCT: 71 %
Platelets: 123 10*3/uL — ABNORMAL LOW (ref 150–440)
RBC: 4.01 MIL/uL (ref 3.80–5.20)
RDW: 15.2 % — AB (ref 11.5–14.5)
WBC: 8.1 10*3/uL (ref 3.6–11.0)

## 2016-11-27 LAB — SURGICAL PCR SCREEN
MRSA, PCR: NEGATIVE
Staphylococcus aureus: NEGATIVE

## 2016-11-27 LAB — PROTIME-INR
INR: 0.97
Prothrombin Time: 12.9 seconds (ref 11.4–15.2)

## 2016-11-27 LAB — APTT: aPTT: 32 seconds (ref 24–36)

## 2016-11-27 NOTE — Patient Instructions (Signed)
Your procedure is scheduled on: Nov 30, 2016 (Thursday )Report to Same Day Surgery 2nd floor medical mall [facility] Entrance-take elevator on left to 2nd floor.  Check in with surgery information desk.) To find out your arrival time please call [phone] between 1PM - 3PM on Nov 29, 2016 (Wednesday)  Remember: Instructions that are not followed completely may result in serious medical risk, up to and including death, or upon the discretion of your surgeon and anesthesiologist your surgery may need to be rescheduled.    _x___ 1. Do not eat food or drink liquids after midnight. No gum chewing or  hard candies                            __x__ 2. No Alcohol for 24 hours before or after surgery.   __x__3. No Smoking for 24 prior to surgery.   ____  4. Bring all medications with you on the day of surgery if instructed.    __x__ 5. Notify your doctor if there is any change in your medical condition     (cold, fever, infections).     Do not wear jewelry, make-up, hairpins, clips or nail polish.  Do not wear lotions, powders, or perfumes.   Do not shave 48 hours prior to surgery. Men may shave face and neck.  Do not bring valuables to the hospital.    [facility] is not responsible for any belongings or valuables.               Contacts, dentures or bridgework may not be worn into surgery.  Leave your suitcase in the car. After surgery it may be brought to your room.  For patients admitted to the hospital, discharge time is determined by your treatment team                        Patients discharged the day of surgery will not be allowed to drive home.  You will need someone to drive you home and stay with you the night of your procedure.    Please read over the following fact sheets that you were given:   [facility] Preparing for Surgery and or MRSA Information   _x___ Take anti-hypertensive (unless it includes a diuretic), cardiac, seizure, asthma,     anti-reflux and psychiatric  medicines with a sip of water. These include:  1.  Amlodipine  2. Carvedilol   3. HydrALAZINE  4. Lisinopril   ____Fleets enema or Magnesium Citrate as directed.   _x___ Use CHG Soap or sage wipes as directed on instruction sheet   ____ Use inhalers on the day of surgery and bring to hospital day of surgery  ____ Stop Metformin and Janumet 2 days prior to surgery.    ____ Take 1/2 of usual insulin dose the night before surgery and none on the morning surgery (TAKE ONE-HALF OF TOUJEO THE NIGHT BEFORE SURGERY AND NO INSULIN THE MORNING OF SURGERY)       _x___ Follow recommendations from Cardiologist, Pulmonologist or PCP regarding          stopping Aspirin, Coumadin, Pllavix ,Eliquis, Effient, or Pradaxa, and Pletal.  X____Stop Anti-inflammatories such as Advil, Aleve, Ibuprofen, Motrin, Naproxen, Naprosyn, Goodies powders or aspirin products. OK to take Tylenol    _x___ Stop supplements until after surgery.  But may continue Vitamin D, Vitamin B, multivitamin.        ____  Bring C-Pap to the hospital.

## 2016-11-27 NOTE — Pre-Procedure Instructions (Signed)
EKG READ BY DR Rosey Bath AND OK

## 2016-11-28 DIAGNOSIS — N186 End stage renal disease: Secondary | ICD-10-CM | POA: Diagnosis not present

## 2016-11-28 DIAGNOSIS — Z992 Dependence on renal dialysis: Secondary | ICD-10-CM | POA: Diagnosis not present

## 2016-11-28 NOTE — Pre-Procedure Instructions (Signed)
Met B sent to Dr. Lucky Cowboy and Anesthesia for review.

## 2016-11-29 DIAGNOSIS — N186 End stage renal disease: Secondary | ICD-10-CM | POA: Diagnosis not present

## 2016-11-29 DIAGNOSIS — Z992 Dependence on renal dialysis: Secondary | ICD-10-CM | POA: Diagnosis not present

## 2016-11-29 DIAGNOSIS — E113511 Type 2 diabetes mellitus with proliferative diabetic retinopathy with macular edema, right eye: Secondary | ICD-10-CM | POA: Diagnosis not present

## 2016-11-29 DIAGNOSIS — E113412 Type 2 diabetes mellitus with severe nonproliferative diabetic retinopathy with macular edema, left eye: Secondary | ICD-10-CM | POA: Diagnosis not present

## 2016-11-30 ENCOUNTER — Encounter
Admission: RE | Disposition: A | Discharge status: Home / Self Care | Payer: Self-pay | Source: Ambulatory Visit | Attending: Vascular Surgery

## 2016-11-30 ENCOUNTER — Ambulatory Visit
Admission: RE | Admit: 2016-11-30 | Discharge: 2016-11-30 | Disposition: A | Discharge status: Home / Self Care | Payer: Medicare HMO | Source: Ambulatory Visit | Attending: Vascular Surgery | Admitting: Vascular Surgery

## 2016-11-30 ENCOUNTER — Encounter: Payer: Self-pay | Admitting: *Deleted

## 2016-11-30 ENCOUNTER — Ambulatory Visit: Payer: Medicare HMO | Admitting: Anesthesiology

## 2016-11-30 DIAGNOSIS — Z992 Dependence on renal dialysis: Secondary | ICD-10-CM | POA: Insufficient documentation

## 2016-11-30 DIAGNOSIS — T829XXA Unspecified complication of cardiac and vascular prosthetic device, implant and graft, initial encounter: Secondary | ICD-10-CM | POA: Diagnosis not present

## 2016-11-30 DIAGNOSIS — E1122 Type 2 diabetes mellitus with diabetic chronic kidney disease: Secondary | ICD-10-CM | POA: Diagnosis not present

## 2016-11-30 DIAGNOSIS — Z8619 Personal history of other infectious and parasitic diseases: Secondary | ICD-10-CM | POA: Insufficient documentation

## 2016-11-30 DIAGNOSIS — I132 Hypertensive heart and chronic kidney disease with heart failure and with stage 5 chronic kidney disease, or end stage renal disease: Secondary | ICD-10-CM | POA: Insufficient documentation

## 2016-11-30 DIAGNOSIS — Z794 Long term (current) use of insulin: Secondary | ICD-10-CM | POA: Insufficient documentation

## 2016-11-30 DIAGNOSIS — X58XXXA Exposure to other specified factors, initial encounter: Secondary | ICD-10-CM | POA: Insufficient documentation

## 2016-11-30 DIAGNOSIS — Z90710 Acquired absence of both cervix and uterus: Secondary | ICD-10-CM | POA: Insufficient documentation

## 2016-11-30 DIAGNOSIS — Z87891 Personal history of nicotine dependence: Secondary | ICD-10-CM | POA: Insufficient documentation

## 2016-11-30 DIAGNOSIS — D649 Anemia, unspecified: Secondary | ICD-10-CM | POA: Diagnosis not present

## 2016-11-30 DIAGNOSIS — E7800 Pure hypercholesterolemia, unspecified: Secondary | ICD-10-CM | POA: Insufficient documentation

## 2016-11-30 DIAGNOSIS — I509 Heart failure, unspecified: Secondary | ICD-10-CM | POA: Insufficient documentation

## 2016-11-30 DIAGNOSIS — N186 End stage renal disease: Secondary | ICD-10-CM | POA: Diagnosis not present

## 2016-11-30 DIAGNOSIS — Z880 Allergy status to penicillin: Secondary | ICD-10-CM | POA: Insufficient documentation

## 2016-11-30 DIAGNOSIS — R251 Tremor, unspecified: Secondary | ICD-10-CM | POA: Insufficient documentation

## 2016-11-30 DIAGNOSIS — I2510 Atherosclerotic heart disease of native coronary artery without angina pectoris: Secondary | ICD-10-CM | POA: Insufficient documentation

## 2016-11-30 DIAGNOSIS — E039 Hypothyroidism, unspecified: Secondary | ICD-10-CM | POA: Insufficient documentation

## 2016-11-30 DIAGNOSIS — Z79899 Other long term (current) drug therapy: Secondary | ICD-10-CM | POA: Insufficient documentation

## 2016-11-30 DIAGNOSIS — Z888 Allergy status to other drugs, medicaments and biological substances status: Secondary | ICD-10-CM | POA: Insufficient documentation

## 2016-11-30 DIAGNOSIS — E1140 Type 2 diabetes mellitus with diabetic neuropathy, unspecified: Secondary | ICD-10-CM | POA: Insufficient documentation

## 2016-11-30 DIAGNOSIS — Z9841 Cataract extraction status, right eye: Secondary | ICD-10-CM | POA: Insufficient documentation

## 2016-11-30 HISTORY — PX: DIALYSIS/PERMA CATHETER REMOVAL: CATH118289

## 2016-11-30 HISTORY — PX: AV FISTULA PLACEMENT: SHX1204

## 2016-11-30 LAB — GLUCOSE, CAPILLARY
Glucose-Capillary: 65 mg/dL (ref 65–99)
Glucose-Capillary: 84 mg/dL (ref 65–99)
Glucose-Capillary: 62 mg/dL — ABNORMAL LOW (ref 65–99)
Glucose-Capillary: 111 mg/dL — ABNORMAL HIGH (ref 65–99)

## 2016-11-30 LAB — POCT I-STAT 4, (NA,K, GLUC, HGB,HCT)
Glucose, Bld: 65 mg/dL (ref 65–99)
HCT: 31 % — ABNORMAL LOW (ref 36.0–46.0)
Hemoglobin: 10.5 g/dL — ABNORMAL LOW (ref 12.0–15.0)
Potassium: 5.1 mmol/L (ref 3.5–5.1)
Sodium: 141 mmol/L (ref 135–145)

## 2016-11-30 SURGERY — ARTERIOVENOUS (AV) FISTULA CREATION
Anesthesia: General | Laterality: Right

## 2016-11-30 MED ORDER — DEXTROSE 50 % IV SOLN
INTRAVENOUS | Status: AC
  Administered 2016-11-30: 6.25 g via INTRAVENOUS
  Filled 2016-11-30: qty 50

## 2016-11-30 MED ORDER — LIDOCAINE HCL (PF) 2 % IJ SOLN
INTRAMUSCULAR | Status: AC
  Filled 2016-11-30: qty 2

## 2016-11-30 MED ORDER — FENTANYL CITRATE (PF) 100 MCG/2ML IJ SOLN
INTRAMUSCULAR | Status: AC
  Filled 2016-11-30: qty 2

## 2016-11-30 MED ORDER — NEOSTIGMINE METHYLSULFATE 10 MG/10ML IV SOLN
INTRAVENOUS | Status: DC | PRN
  Administered 2016-11-30: 2.5 mg via INTRAVENOUS

## 2016-11-30 MED ORDER — CHLORHEXIDINE GLUCONATE CLOTH 2 % EX PADS: 6.0000 | MEDICATED_PAD | Freq: Once | CUTANEOUS | Status: DC

## 2016-11-30 MED ORDER — NEOSTIGMINE METHYLSULFATE 10 MG/10ML IV SOLN
INTRAVENOUS | Status: AC
  Filled 2016-11-30: qty 1

## 2016-11-30 MED ORDER — PROPOFOL 10 MG/ML IV BOLUS
INTRAVENOUS | Status: DC | PRN
  Administered 2016-11-30: 60 mg via INTRAVENOUS

## 2016-11-30 MED ORDER — SUCCINYLCHOLINE CHLORIDE 20 MG/ML IJ SOLN
INTRAMUSCULAR | Status: DC | PRN
  Administered 2016-11-30: 100 mg via INTRAVENOUS

## 2016-11-30 MED ORDER — FAMOTIDINE 20 MG PO TABS
ORAL_TABLET | ORAL | Status: AC
  Filled 2016-11-30: qty 1

## 2016-11-30 MED ORDER — DEXAMETHASONE SODIUM PHOSPHATE 10 MG/ML IJ SOLN
INTRAMUSCULAR | Status: AC
Start: 2016-11-30 — End: 2016-11-30
  Filled 2016-11-30: qty 1

## 2016-11-30 MED ORDER — ROCURONIUM BROMIDE 100 MG/10ML IV SOLN
INTRAVENOUS | Status: DC | PRN
  Administered 2016-11-30: 10 mg via INTRAVENOUS
  Administered 2016-11-30: 5 mg via INTRAVENOUS

## 2016-11-30 MED ORDER — EPHEDRINE SULFATE 50 MG/ML IJ SOLN
INTRAMUSCULAR | Status: AC
  Filled 2016-11-30: qty 1

## 2016-11-30 MED ORDER — FENTANYL CITRATE (PF) 100 MCG/2ML IJ SOLN: 25.0000 ug | INTRAMUSCULAR | Status: DC | PRN

## 2016-11-30 MED ORDER — VANCOMYCIN HCL IN DEXTROSE 1-5 GM/200ML-% IV SOLN
INTRAVENOUS | Status: AC
  Administered 2016-11-30: 1000 mg via INTRAVENOUS
  Filled 2016-11-30: qty 200

## 2016-11-30 MED ORDER — ONDANSETRON HCL 4 MG/2ML IJ SOLN: 4.0000 mg | Freq: Once | INTRAMUSCULAR | Status: DC | PRN

## 2016-11-30 MED ORDER — EVICEL 2 ML EX KIT
PACK | CUTANEOUS | Status: DC | PRN
  Administered 2016-11-30: 2 mL

## 2016-11-30 MED ORDER — VANCOMYCIN HCL IN DEXTROSE 1-5 GM/200ML-% IV SOLN
1000.0000 mg | INTRAVENOUS | Status: AC
  Administered 2016-11-30: 1000 mg via INTRAVENOUS

## 2016-11-30 MED ORDER — PHENYLEPHRINE HCL 10 MG/ML IJ SOLN
INTRAMUSCULAR | Status: DC | PRN
  Administered 2016-11-30 (×2): 100 ug via INTRAVENOUS

## 2016-11-30 MED ORDER — HEPARIN SODIUM (PORCINE) 1000 UNIT/ML IJ SOLN
INTRAMUSCULAR | Status: DC | PRN
  Administered 2016-11-30: 3000 [IU] via INTRAVENOUS

## 2016-11-30 MED ORDER — DEXAMETHASONE SODIUM PHOSPHATE 10 MG/ML IJ SOLN
INTRAMUSCULAR | Status: DC | PRN
  Administered 2016-11-30: 5 mg via INTRAVENOUS

## 2016-11-30 MED ORDER — PROPOFOL 10 MG/ML IV BOLUS
INTRAVENOUS | Status: AC
  Filled 2016-11-30: qty 20

## 2016-11-30 MED ORDER — DEXTROSE 50 % IV SOLN
25.0000 mL | Freq: Once | INTRAVENOUS | Status: AC
  Administered 2016-11-30: 25 mL via INTRAVENOUS

## 2016-11-30 MED ORDER — MIDAZOLAM HCL 2 MG/2ML IJ SOLN
INTRAMUSCULAR | Status: AC
  Filled 2016-11-30: qty 2

## 2016-11-30 MED ORDER — GLYCOPYRROLATE 0.2 MG/ML IJ SOLN
INTRAMUSCULAR | Status: AC
Start: 2016-11-30 — End: 2016-11-30
  Filled 2016-11-30: qty 3

## 2016-11-30 MED ORDER — DEXTROSE 50 % IV SOLN
6.2500 g | Freq: Once | INTRAVENOUS | Status: AC
  Administered 2016-11-30: 6.25 g via INTRAVENOUS

## 2016-11-30 MED ORDER — HEPARIN SODIUM (PORCINE) 5000 UNIT/ML IJ SOLN
INTRAMUSCULAR | Status: AC
  Filled 2016-11-30: qty 1

## 2016-11-30 MED ORDER — EPHEDRINE SULFATE 50 MG/ML IJ SOLN
INTRAMUSCULAR | Status: DC | PRN
  Administered 2016-11-30 (×2): 10 mg via INTRAVENOUS

## 2016-11-30 MED ORDER — GLYCOPYRROLATE 0.2 MG/ML IJ SOLN
INTRAMUSCULAR | Status: DC | PRN
  Administered 2016-11-30: .6 mg via INTRAVENOUS

## 2016-11-30 MED ORDER — SODIUM CHLORIDE 0.9 % IV SOLN
INTRAVENOUS | Status: DC
  Administered 2016-11-30: 14:00:00 via INTRAVENOUS

## 2016-11-30 MED ORDER — LIDOCAINE HCL (CARDIAC) 20 MG/ML IV SOLN
INTRAVENOUS | Status: DC | PRN
  Administered 2016-11-30: 60 mg via INTRAVENOUS

## 2016-11-30 MED ORDER — FAMOTIDINE 20 MG PO TABS
20.0000 mg | ORAL_TABLET | Freq: Once | ORAL | Status: AC
  Administered 2016-11-30: 20 mg via ORAL

## 2016-11-30 MED ORDER — EVICEL 2 ML EX KIT
PACK | CUTANEOUS | Status: AC
  Filled 2016-11-30: qty 1

## 2016-11-30 MED ORDER — HEPARIN SODIUM (PORCINE) 1000 UNIT/ML IJ SOLN
INTRAMUSCULAR | Status: AC
  Filled 2016-11-30: qty 1

## 2016-11-30 MED ORDER — HYDROCODONE-ACETAMINOPHEN 5-325 MG PO TABS: 1.0000 | ORAL_TABLET | Freq: Four times a day (QID) | ORAL | 0 refills | Status: DC | PRN

## 2016-11-30 MED ORDER — ONDANSETRON HCL 4 MG/2ML IJ SOLN
INTRAMUSCULAR | Status: AC
  Filled 2016-11-30: qty 2

## 2016-11-30 MED ORDER — FAMOTIDINE 20 MG PO TABS
ORAL_TABLET | ORAL | Status: AC
  Administered 2016-11-30: 20 mg via ORAL
  Filled 2016-11-30: qty 1

## 2016-11-30 MED ORDER — MIDAZOLAM HCL 5 MG/5ML IJ SOLN
INTRAMUSCULAR | Status: DC | PRN
  Administered 2016-11-30: 2 mg via INTRAVENOUS

## 2016-11-30 MED ORDER — ONDANSETRON HCL 4 MG/2ML IJ SOLN
INTRAMUSCULAR | Status: DC | PRN
  Administered 2016-11-30: 4 mg via INTRAVENOUS

## 2016-11-30 MED ORDER — FENTANYL CITRATE (PF) 100 MCG/2ML IJ SOLN
INTRAMUSCULAR | Status: DC | PRN
Start: 2016-11-30 — End: 2016-11-30
  Administered 2016-11-30: 100 ug via INTRAVENOUS

## 2016-11-30 SURGICAL SUPPLY — 64 items
ADAPTER BETA CAP QUINTON DIALY (ADAPTER) IMPLANT
ADAPTER CATH DIALYSIS 18.75 (CATHETERS) IMPLANT
ADAPTER CATH DIALYSIS 18.75CM (CATHETERS)
BAG DECANTER FOR FLEXI CONT (MISCELLANEOUS) ×5 IMPLANT
BLADE SURG SZ11 CARB STEEL (BLADE) ×5 IMPLANT
BOOT SUTURE AID YELLOW STND (SUTURE) ×5 IMPLANT
BRUSH SCRUB 4% CHG (MISCELLANEOUS) ×5 IMPLANT
CANISTER SUCT 1200ML W/VALVE (MISCELLANEOUS) ×5 IMPLANT
CATH DLYS SWAN NECK 62.5CM (CATHETERS) IMPLANT
CHLORAPREP W/TINT 26ML (MISCELLANEOUS) ×5 IMPLANT
CLIP SPRNG 6MM S-JAW DBL (CLIP) ×5
DERMABOND ADVANCED (GAUZE/BANDAGES/DRESSINGS) ×2
DERMABOND ADVANCED .7 DNX12 (GAUZE/BANDAGES/DRESSINGS) ×3 IMPLANT
ELECT CAUTERY BLADE 6.4 (BLADE) ×5 IMPLANT
ELECT REM PT RETURN 9FT ADLT (ELECTROSURGICAL) ×5
ELECTRODE REM PT RTRN 9FT ADLT (ELECTROSURGICAL) ×3 IMPLANT
GEL ULTRASOUND 20GR AQUASONIC (MISCELLANEOUS) IMPLANT
GLOVE BIO SURGEON STRL SZ7 (GLOVE) ×10 IMPLANT
GLOVE INDICATOR 7.5 STRL GRN (GLOVE) ×5 IMPLANT
GOWN STRL REUS W/ TWL LRG LVL3 (GOWN DISPOSABLE) ×6 IMPLANT
GOWN STRL REUS W/ TWL XL LVL3 (GOWN DISPOSABLE) ×3 IMPLANT
GOWN STRL REUS W/TWL LRG LVL3 (GOWN DISPOSABLE) ×4
GOWN STRL REUS W/TWL XL LVL3 (GOWN DISPOSABLE) ×2
HEMOSTAT SURGICEL 2X3 (HEMOSTASIS) ×5 IMPLANT
IV NS 500ML (IV SOLUTION)
IV NS 500ML BAXH (IV SOLUTION) IMPLANT
KIT RM TURNOVER STRD PROC AR (KITS) ×5 IMPLANT
LABEL OR SOLS (LABEL) IMPLANT
LOOP RED MAXI  1X406MM (MISCELLANEOUS) ×2
LOOP VESSEL MAXI 1X406 RED (MISCELLANEOUS) ×3 IMPLANT
LOOP VESSEL MINI 0.8X406 BLUE (MISCELLANEOUS) ×3 IMPLANT
LOOPS BLUE MINI 0.8X406MM (MISCELLANEOUS) ×2
MINICAP W/POVIDONE IODINE SOL (MISCELLANEOUS) ×5 IMPLANT
NEEDLE FILTER BLUNT 18X 1/2SAF (NEEDLE) ×2
NEEDLE FILTER BLUNT 18X1 1/2 (NEEDLE) ×3 IMPLANT
NEEDLE HYPO 30X.5 LL (NEEDLE) IMPLANT
NS IRRIG 500ML POUR BTL (IV SOLUTION) ×5 IMPLANT
PACK EXTREMITY ARMC (MISCELLANEOUS) ×5 IMPLANT
PACK LAP CHOLECYSTECTOMY (MISCELLANEOUS) ×5 IMPLANT
PAD PREP 24X41 OB/GYN DISP (PERSONAL CARE ITEMS) ×5 IMPLANT
PENCIL ELECTRO HAND CTR (MISCELLANEOUS) ×5 IMPLANT
SET CYSTO W/LG BORE CLAMP LF (SET/KITS/TRAYS/PACK) IMPLANT
SET TRANSFER 6 W/TWIST CLAMP 5 (SET/KITS/TRAYS/PACK) IMPLANT
SOLUTION CELL SAVER (CLIP) ×3 IMPLANT
SPONGE VERSALON 4X4 4PLY (MISCELLANEOUS) ×5 IMPLANT
STOCKINETTE STRL 4IN 9604848 (GAUZE/BANDAGES/DRESSINGS) ×5 IMPLANT
SUT MNCRL AB 4-0 PS2 18 (SUTURE) ×5 IMPLANT
SUT PROLENE 6 0 BV (SUTURE) ×20 IMPLANT
SUT SILK 2 0 (SUTURE) ×2
SUT SILK 2-0 18XBRD TIE 12 (SUTURE) ×3 IMPLANT
SUT SILK 3 0 (SUTURE) ×2
SUT SILK 3-0 18XBRD TIE 12 (SUTURE) ×3 IMPLANT
SUT SILK 4 0 (SUTURE) ×2
SUT SILK 4-0 18XBRD TIE 12 (SUTURE) ×3 IMPLANT
SUT VIC AB 2-0 UR6 27 (SUTURE) ×5 IMPLANT
SUT VIC AB 3-0 SH 27 (SUTURE) ×4
SUT VIC AB 3-0 SH 27X BRD (SUTURE) ×6 IMPLANT
SUT VICRYL+ 3-0 36IN CT-1 (SUTURE) ×5 IMPLANT
SYR 20CC LL (SYRINGE) ×5 IMPLANT
SYR 3ML LL SCALE MARK (SYRINGE) ×5 IMPLANT
SYR TB 1ML 27GX1/2 LL (SYRINGE) IMPLANT
TOWEL OR 17X26 4PK STRL BLUE (TOWEL DISPOSABLE) IMPLANT
TROCAR XCEL NON-BLD 11X100MML (ENDOMECHANICALS) IMPLANT
TUBING INSUFFLATOR HI FLOW (MISCELLANEOUS) ×5 IMPLANT

## 2016-11-30 NOTE — Discharge Instructions (Signed)

## 2016-11-30 NOTE — H&P (Signed)
[facility] History & Physical Update  The patient was interviewed and re-examined.  The patient's previous History and Physical has been reviewed and is unchanged. The patient reports that her PD catheter is now working adequately and she has not used her PermCath in about a month. She desires to have this removed. We are not going to revise her PD catheter as it is working better. We are going to plan to place a left arm AV fistula for hemodialysis as she has had multiple issues with peritoneal dialysis. We can go ahead and remove her PermCath as well as that is what she desires. She understands this might result and a new PermCath needing to be placed for her fistula is functional and she is understanding of this as is her husband. We plan to proceed with the scheduled procedure.  Leotis Pain, MD  11/30/2016, 2:54 PM

## 2016-11-30 NOTE — Anesthesia Preprocedure Evaluation (Signed)
Anesthesia Evaluation  Patient identified by MRN, date of birth, ID band Patient awake    Reviewed: Allergy & Precautions, NPO status , Patient's Chart, lab work & pertinent test results  History of Anesthesia Complications (+) PONV and history of anesthetic complications  Airway Mallampati: III  TM Distance: <3 FB Neck ROM: Full    Dental  (+) Upper Dentures, Lower Dentures   Pulmonary shortness of breath and with exertion, former smoker,     + decreased breath sounds      Cardiovascular hypertension, Pt. on home beta blockers + CAD and +CHF   Rhythm:Regular Rate:Normal     Neuro/Psych negative neurological ROS  negative psych ROS   GI/Hepatic negative GI ROS, Neg liver ROS,   Endo/Other  diabetes, Type 1, Insulin DependentHypothyroidism   Renal/GU DialysisRenal disease     Musculoskeletal   Abdominal (+) + obese,   Peds  Hematology  (+) anemia ,   Anesthesia Other Findings   Reproductive/Obstetrics                             Anesthesia Physical  Anesthesia Plan  ASA: III  Anesthesia Plan: General   Post-op Pain Management:    Induction: Intravenous  Airway Management Planned: Oral ETT  Additional Equipment:   Intra-op Plan:   Post-operative Plan: Extubation in OR  Informed Consent: I have reviewed the patients History and Physical, chart, labs and discussed the procedure including the risks, benefits and alternatives for the proposed anesthesia with the patient or authorized representative who has indicated his/her understanding and acceptance.     Plan Discussed with: CRNA  Anesthesia Plan Comments:         Anesthesia Quick Evaluation

## 2016-11-30 NOTE — Anesthesia Postprocedure Evaluation (Signed)
Anesthesia Post Note  Patient: Bethany Lee  Procedure(s) Performed: Procedure(s) (LRB): ARTERIOVENOUS (AV) FISTULA CREATION ( EXPLORE FOR CREATION BRACHIOCEPHALIC) (Left) DIALYSIS/PERMA CATHETER REMOVAL (Right)  Patient location during evaluation: PACU Anesthesia Type: General Level of consciousness: awake and alert and oriented Pain management: pain level controlled Vital Signs Assessment: post-procedure vital signs reviewed and stable Respiratory status: spontaneous breathing Cardiovascular status: blood pressure returned to baseline Anesthetic complications: no     Last Vitals:  Vitals:   11/30/16 1743 11/30/16 1758  BP: 135/65 (!) 152/99  Pulse: 79 84  Resp: 17 18  Temp: 37.2 C     Last Pain:  Vitals:   11/30/16 1758  TempSrc:   PainSc: 0-No pain                 Alyvia Derk

## 2016-11-30 NOTE — Op Note (Signed)
[facility]   OPERATIVE NOTE   PROCEDURE: Left brachiocephalic arteriovenous fistula placement  PRE-OPERATIVE DIAGNOSIS: 1.  ESRD      2. Problems with PD  POST-OPERATIVE DIAGNOSIS: 1. ESRD     2. Problems with PD  SURGEON: Leotis Pain, MD  ASSISTANT(S): Hezzie Bump, PA-C  ANESTHESIA: general  ESTIMATED BLOOD LOSS: 5 cc  FINDING(S): Adequate cephalic vein for fistula creation  SPECIMEN(S):  none  INDICATIONS:   Bethany Lee is a 66 y.o. female who presents with renal failure in need of pemanent dialysis acces for hemodialysis as she has had recurrent problems with her PD catheter.  The patient is scheduled for left arm AVF placement.  The patient is aware the risks include but are not limited to: bleeding, infection, steal syndrome, nerve damage, ischemic monomelic neuropathy, failure to mature, and need for additional procedures.  The patient is aware of the risks of the procedure and elects to proceed forward.  DESCRIPTION: After full informed written consent was obtained from the patient, the patient was brought back to the operating room and placed supine upon the operating table.  Prior to induction, the patient received IV antibiotics.   After obtaining adequate anesthesia, the patient was then prepped and draped in the standard fashion for a left arm access procedure.  I made a curvilinear incision at the level of the antecubital fossa and dissected through the subcutaneous tissue and fascia to gain exposure of the brachial artery.  This was noted to be patent and adequate in size for fistula creation.  This was dissected out proximally and distally and prepared for control with vessel loops .  I then dissected out the cephalic vein.  This was noted to be patent and adequate in size for fistula creation.  I then gave the patient 3000 units of intravenous heparin.  The vein was marked for orientation and the distal segment of the vein was ligated  with a  2-0 silk, and the vein was transected.  I then instilled the heparinized saline into the vein and clamped it.  At this point, I reset my exposure of the brachial artery and pulled up control on the vessel loops.  I made an arteriotomy with a #11 blade, and then I extended the arteriotomy with a Potts scissor.  I injected heparinized saline proximal and distal to this arteriotomy.  The vein was then sewn to the artery in an end-to-side configuration with a running stitch of 6-0 Prolene.  Prior to completing this anastomosis, I allowed the vein and artery to backbleed.  There was no evidence of clot from any vessels.  I completed the anastomosis in the usual fashion and then released all vessel loops and clamps.  There was a palpable  thrill in the venous outflow, and there was a palpable pulse in the artery distal to the anastomosis.  At this point, I irrigated out the surgical wound.  Surgicel was placed. There was no further active bleeding.  The subcutaneous tissue was reapproximated with a running stitch of 3-0 Vicryl.  The skin was then closed with a 4-0 Monocryl suture.  The skin was then cleaned, dried, and reinforced with Dermabond.  The patient tolerated this procedure well and was taken to the recovery room in stable condition  COMPLICATIONS: None  CONDITION: Stable   Leotis Pain    11/30/2016, 5:38 PM  This note was created with Dragon Medical transcription system. Any errors in dictation are purely unintentional.

## 2016-11-30 NOTE — Anesthesia Procedure Notes (Signed)
Procedure Name: Intubation Date/Time: 11/30/2016 4:17 PM Performed by: Dionne Bucy Pre-anesthesia Checklist: Patient identified, Patient being monitored, Timeout performed, Emergency Drugs available and Suction available Patient Re-evaluated:Patient Re-evaluated prior to inductionOxygen Delivery Method: Circle system utilized Preoxygenation: Pre-oxygenation with 100% oxygen Intubation Type: IV induction Ventilation: Mask ventilation without difficulty Laryngoscope Size: Mac and 3 Grade View: Grade I Tube type: Oral Tube size: 7.0 mm Number of attempts: 1 Airway Equipment and Method: Stylet Placement Confirmation: ETT inserted through vocal cords under direct vision,  positive ETCO2 and breath sounds checked- equal and bilateral Secured at: 21 cm Tube secured with: Tape Dental Injury: Teeth and Oropharynx as per pre-operative assessment

## 2016-11-30 NOTE — Op Note (Signed)
Operative Note     Preoperative diagnosis:   1. ESRD with functional permanent access  Postoperative diagnosis:  1. ESRD with functional permanent access  Procedure:  Removal of right jugular Permcath  Surgeon:  Leotis Pain, MD  Anesthesia:  Local  EBL:  Minimal  Indication for the Procedure:  The patient has a functional permanent dialysis access and no longer needs their permcath.  This can be removed.  Risks and benefits are discussed and informed consent is obtained.  Description of the Procedure:  The patient's right neck, chest and existing catheter were sterilely prepped and draped. The area around the catheter was anesthetized copiously with 1% lidocaine. The catheter was dissected out with curved hemostats until the cuff was freed from the surrounding fibrous sheath. The fiber sheath was transected, and the catheter was then removed in its entirety using gentle traction. Pressure was held and sterile dressings were placed. The patient tolerated the procedure well and was taken to the recovery room in stable condition.     Leotis Pain  11/30/2016, 5:39 PM This note was created with Dragon Medical transcription system. Any errors in dictation are purely unintentional.

## 2016-11-30 NOTE — Anesthesia Post-op Follow-up Note (Cosign Needed)
Anesthesia QCDR form completed.        

## 2016-11-30 NOTE — Transfer of Care (Signed)
Immediate Anesthesia Transfer of Care Note  Patient: Bethany Lee  Procedure(s) Performed: Procedure(s): ARTERIOVENOUS (AV) FISTULA CREATION ( EXPLORE FOR CREATION BRACHIOCEPHALIC) (Left) DIALYSIS/PERMA CATHETER REMOVAL (Right)  Patient Location: PACU  Anesthesia Type:General  Level of Consciousness: sedated  Airway & Oxygen Therapy: Patient Spontanous Breathing and Patient connected to face mask oxygen  Post-op Assessment: Report given to RN and Post -op Vital signs reviewed and stable  Post vital signs: Reviewed and stable  Last Vitals:  Vitals:   11/30/16 1335  BP: (!) 153/67  Pulse: 73  Resp: 18  Temp: 36.7 C    Last Pain:  Vitals:   11/30/16 1335  TempSrc: Oral  PainSc: 2          Complications: No apparent anesthesia complications

## 2016-12-01 ENCOUNTER — Encounter: Payer: Self-pay | Admitting: Vascular Surgery

## 2016-12-01 DIAGNOSIS — Z992 Dependence on renal dialysis: Secondary | ICD-10-CM | POA: Diagnosis not present

## 2016-12-01 DIAGNOSIS — N186 End stage renal disease: Secondary | ICD-10-CM | POA: Diagnosis not present

## 2016-12-02 DIAGNOSIS — Z992 Dependence on renal dialysis: Secondary | ICD-10-CM | POA: Diagnosis not present

## 2016-12-02 DIAGNOSIS — N186 End stage renal disease: Secondary | ICD-10-CM | POA: Diagnosis not present

## 2016-12-03 DIAGNOSIS — Z992 Dependence on renal dialysis: Secondary | ICD-10-CM | POA: Diagnosis not present

## 2016-12-03 DIAGNOSIS — N186 End stage renal disease: Secondary | ICD-10-CM | POA: Diagnosis not present

## 2016-12-04 DIAGNOSIS — N185 Chronic kidney disease, stage 5: Secondary | ICD-10-CM | POA: Diagnosis not present

## 2016-12-04 DIAGNOSIS — Z955 Presence of coronary angioplasty implant and graft: Secondary | ICD-10-CM | POA: Diagnosis not present

## 2016-12-04 DIAGNOSIS — G63 Polyneuropathy in diseases classified elsewhere: Secondary | ICD-10-CM | POA: Diagnosis not present

## 2016-12-04 DIAGNOSIS — D638 Anemia in other chronic diseases classified elsewhere: Secondary | ICD-10-CM | POA: Diagnosis not present

## 2016-12-04 DIAGNOSIS — E113412 Type 2 diabetes mellitus with severe nonproliferative diabetic retinopathy with macular edema, left eye: Secondary | ICD-10-CM | POA: Diagnosis not present

## 2016-12-04 DIAGNOSIS — I2510 Atherosclerotic heart disease of native coronary artery without angina pectoris: Secondary | ICD-10-CM | POA: Diagnosis not present

## 2016-12-04 DIAGNOSIS — N186 End stage renal disease: Secondary | ICD-10-CM | POA: Diagnosis not present

## 2016-12-04 DIAGNOSIS — E1121 Type 2 diabetes mellitus with diabetic nephropathy: Secondary | ICD-10-CM | POA: Diagnosis not present

## 2016-12-04 DIAGNOSIS — E782 Mixed hyperlipidemia: Secondary | ICD-10-CM | POA: Diagnosis not present

## 2016-12-04 DIAGNOSIS — E1122 Type 2 diabetes mellitus with diabetic chronic kidney disease: Secondary | ICD-10-CM | POA: Diagnosis not present

## 2016-12-04 DIAGNOSIS — I10 Essential (primary) hypertension: Secondary | ICD-10-CM | POA: Diagnosis not present

## 2016-12-04 DIAGNOSIS — Z794 Long term (current) use of insulin: Secondary | ICD-10-CM | POA: Diagnosis not present

## 2016-12-04 DIAGNOSIS — I5041 Acute combined systolic (congestive) and diastolic (congestive) heart failure: Secondary | ICD-10-CM | POA: Diagnosis not present

## 2016-12-04 DIAGNOSIS — Z992 Dependence on renal dialysis: Secondary | ICD-10-CM | POA: Diagnosis not present

## 2016-12-04 DIAGNOSIS — E1142 Type 2 diabetes mellitus with diabetic polyneuropathy: Secondary | ICD-10-CM | POA: Diagnosis not present

## 2016-12-05 DIAGNOSIS — Z992 Dependence on renal dialysis: Secondary | ICD-10-CM | POA: Diagnosis not present

## 2016-12-05 DIAGNOSIS — N186 End stage renal disease: Secondary | ICD-10-CM | POA: Diagnosis not present

## 2016-12-06 DIAGNOSIS — Z992 Dependence on renal dialysis: Secondary | ICD-10-CM | POA: Diagnosis not present

## 2016-12-06 DIAGNOSIS — N186 End stage renal disease: Secondary | ICD-10-CM | POA: Diagnosis not present

## 2016-12-07 DIAGNOSIS — N186 End stage renal disease: Secondary | ICD-10-CM | POA: Diagnosis not present

## 2016-12-07 DIAGNOSIS — Z992 Dependence on renal dialysis: Secondary | ICD-10-CM | POA: Diagnosis not present

## 2016-12-08 DIAGNOSIS — Z992 Dependence on renal dialysis: Secondary | ICD-10-CM | POA: Diagnosis not present

## 2016-12-08 DIAGNOSIS — N186 End stage renal disease: Secondary | ICD-10-CM | POA: Diagnosis not present

## 2016-12-09 DIAGNOSIS — N186 End stage renal disease: Secondary | ICD-10-CM | POA: Diagnosis not present

## 2016-12-09 DIAGNOSIS — Z992 Dependence on renal dialysis: Secondary | ICD-10-CM | POA: Diagnosis not present

## 2016-12-10 DIAGNOSIS — N186 End stage renal disease: Secondary | ICD-10-CM | POA: Diagnosis not present

## 2016-12-10 DIAGNOSIS — Z992 Dependence on renal dialysis: Secondary | ICD-10-CM | POA: Diagnosis not present

## 2016-12-11 DIAGNOSIS — N186 End stage renal disease: Secondary | ICD-10-CM | POA: Diagnosis not present

## 2016-12-11 DIAGNOSIS — Z992 Dependence on renal dialysis: Secondary | ICD-10-CM | POA: Diagnosis not present

## 2016-12-12 DIAGNOSIS — Z992 Dependence on renal dialysis: Secondary | ICD-10-CM | POA: Diagnosis not present

## 2016-12-12 DIAGNOSIS — N186 End stage renal disease: Secondary | ICD-10-CM | POA: Diagnosis not present

## 2016-12-13 DIAGNOSIS — N186 End stage renal disease: Secondary | ICD-10-CM | POA: Diagnosis not present

## 2016-12-13 DIAGNOSIS — Z992 Dependence on renal dialysis: Secondary | ICD-10-CM | POA: Diagnosis not present

## 2016-12-14 DIAGNOSIS — Z992 Dependence on renal dialysis: Secondary | ICD-10-CM | POA: Diagnosis not present

## 2016-12-14 DIAGNOSIS — N186 End stage renal disease: Secondary | ICD-10-CM | POA: Diagnosis not present

## 2016-12-15 DIAGNOSIS — N186 End stage renal disease: Secondary | ICD-10-CM | POA: Diagnosis not present

## 2016-12-15 DIAGNOSIS — Z992 Dependence on renal dialysis: Secondary | ICD-10-CM | POA: Diagnosis not present

## 2016-12-16 DIAGNOSIS — N186 End stage renal disease: Secondary | ICD-10-CM | POA: Diagnosis not present

## 2016-12-16 DIAGNOSIS — Z992 Dependence on renal dialysis: Secondary | ICD-10-CM | POA: Diagnosis not present

## 2016-12-17 DIAGNOSIS — N186 End stage renal disease: Secondary | ICD-10-CM | POA: Diagnosis not present

## 2016-12-17 DIAGNOSIS — Z992 Dependence on renal dialysis: Secondary | ICD-10-CM | POA: Diagnosis not present

## 2016-12-18 DIAGNOSIS — Z992 Dependence on renal dialysis: Secondary | ICD-10-CM | POA: Diagnosis not present

## 2016-12-18 DIAGNOSIS — N186 End stage renal disease: Secondary | ICD-10-CM | POA: Diagnosis not present

## 2016-12-19 DIAGNOSIS — N186 End stage renal disease: Secondary | ICD-10-CM | POA: Diagnosis not present

## 2016-12-19 DIAGNOSIS — Z992 Dependence on renal dialysis: Secondary | ICD-10-CM | POA: Diagnosis not present

## 2016-12-20 ENCOUNTER — Ambulatory Visit (INDEPENDENT_AMBULATORY_CARE_PROVIDER_SITE_OTHER): Payer: Medicare HMO | Admitting: Vascular Surgery

## 2016-12-20 ENCOUNTER — Encounter (INDEPENDENT_AMBULATORY_CARE_PROVIDER_SITE_OTHER): Payer: Medicare HMO

## 2016-12-20 DIAGNOSIS — Z992 Dependence on renal dialysis: Secondary | ICD-10-CM | POA: Diagnosis not present

## 2016-12-20 DIAGNOSIS — N186 End stage renal disease: Secondary | ICD-10-CM | POA: Diagnosis not present

## 2016-12-21 DIAGNOSIS — N186 End stage renal disease: Secondary | ICD-10-CM | POA: Diagnosis not present

## 2016-12-21 DIAGNOSIS — Z992 Dependence on renal dialysis: Secondary | ICD-10-CM | POA: Diagnosis not present

## 2016-12-22 DIAGNOSIS — N186 End stage renal disease: Secondary | ICD-10-CM | POA: Diagnosis not present

## 2016-12-22 DIAGNOSIS — T50995D Adverse effect of other drugs, medicaments and biological substances, subsequent encounter: Secondary | ICD-10-CM | POA: Diagnosis not present

## 2016-12-22 DIAGNOSIS — Z880 Allergy status to penicillin: Secondary | ICD-10-CM | POA: Diagnosis not present

## 2016-12-22 DIAGNOSIS — R55 Syncope and collapse: Secondary | ICD-10-CM | POA: Diagnosis not present

## 2016-12-22 DIAGNOSIS — Z992 Dependence on renal dialysis: Secondary | ICD-10-CM | POA: Diagnosis not present

## 2016-12-23 DIAGNOSIS — Z992 Dependence on renal dialysis: Secondary | ICD-10-CM | POA: Diagnosis not present

## 2016-12-23 DIAGNOSIS — N186 End stage renal disease: Secondary | ICD-10-CM | POA: Diagnosis not present

## 2016-12-24 DIAGNOSIS — N186 End stage renal disease: Secondary | ICD-10-CM | POA: Diagnosis not present

## 2016-12-24 DIAGNOSIS — Z992 Dependence on renal dialysis: Secondary | ICD-10-CM | POA: Diagnosis not present

## 2016-12-25 DIAGNOSIS — Z992 Dependence on renal dialysis: Secondary | ICD-10-CM | POA: Diagnosis not present

## 2016-12-25 DIAGNOSIS — N186 End stage renal disease: Secondary | ICD-10-CM | POA: Diagnosis not present

## 2016-12-26 DIAGNOSIS — N186 End stage renal disease: Secondary | ICD-10-CM | POA: Diagnosis not present

## 2016-12-26 DIAGNOSIS — Z992 Dependence on renal dialysis: Secondary | ICD-10-CM | POA: Diagnosis not present

## 2016-12-27 DIAGNOSIS — Z992 Dependence on renal dialysis: Secondary | ICD-10-CM | POA: Diagnosis not present

## 2016-12-27 DIAGNOSIS — N186 End stage renal disease: Secondary | ICD-10-CM | POA: Diagnosis not present

## 2016-12-28 DIAGNOSIS — N186 End stage renal disease: Secondary | ICD-10-CM | POA: Diagnosis not present

## 2016-12-28 DIAGNOSIS — Z992 Dependence on renal dialysis: Secondary | ICD-10-CM | POA: Diagnosis not present

## 2016-12-29 DIAGNOSIS — Z992 Dependence on renal dialysis: Secondary | ICD-10-CM | POA: Diagnosis not present

## 2016-12-29 DIAGNOSIS — N186 End stage renal disease: Secondary | ICD-10-CM | POA: Diagnosis not present

## 2016-12-30 DIAGNOSIS — Z992 Dependence on renal dialysis: Secondary | ICD-10-CM | POA: Diagnosis not present

## 2016-12-30 DIAGNOSIS — N186 End stage renal disease: Secondary | ICD-10-CM | POA: Diagnosis not present

## 2016-12-31 DIAGNOSIS — N186 End stage renal disease: Secondary | ICD-10-CM | POA: Diagnosis not present

## 2016-12-31 DIAGNOSIS — Z992 Dependence on renal dialysis: Secondary | ICD-10-CM | POA: Diagnosis not present

## 2017-01-01 ENCOUNTER — Ambulatory Visit (INDEPENDENT_AMBULATORY_CARE_PROVIDER_SITE_OTHER): Payer: Medicare HMO | Admitting: Vascular Surgery

## 2017-01-01 ENCOUNTER — Encounter (INDEPENDENT_AMBULATORY_CARE_PROVIDER_SITE_OTHER): Payer: Self-pay | Admitting: Vascular Surgery

## 2017-01-01 VITALS — BP 165/66 | HR 78 | Resp 16 | Wt 147.0 lb

## 2017-01-01 DIAGNOSIS — Z992 Dependence on renal dialysis: Secondary | ICD-10-CM | POA: Diagnosis not present

## 2017-01-01 DIAGNOSIS — N186 End stage renal disease: Secondary | ICD-10-CM | POA: Diagnosis not present

## 2017-01-01 DIAGNOSIS — E118 Type 2 diabetes mellitus with unspecified complications: Secondary | ICD-10-CM

## 2017-01-01 NOTE — Progress Notes (Signed)
Subjective:    Patient ID: Bethany Lee, female    DOB: 1950-08-07, 66 y.o.   MRN: [id number] Chief Complaint  Patient presents with  . Wound Check   Patient presents for her first postoperative visit. The patient is status post creation of a left brachiocephalic AV fistula and removal of a right IJ PermCath on 11/30/2016. Patient's postoperative course has been unremarkable. The patient presents today with a chief complaint of pain and numbness to her left fingers. Patient is also upset over the size of the incision located on her left upper extremity. She states that since surgery, her hand especially her fingers feel numb, tender and cold. She denies any ulceration to her fingers. Patient denies any issues with her peritoneal dialysis catheter. Patient denies any fever nausea vomiting.    Review of Systems  Constitutional: Negative.   HENT: Negative.   Eyes: Negative.   Respiratory: Negative.   Cardiovascular:       Left upper extremity hand numbness and pain  Gastrointestinal: Negative.   Endocrine: Negative.   Genitourinary: Negative.   Musculoskeletal: Negative.   Skin: Negative.   Allergic/Immunologic: Negative.   Neurological: Negative.   Hematological: Negative.   Psychiatric/Behavioral: Negative.       Objective:   Physical Exam  Constitutional: She is oriented to person, place, and time. She appears well-developed and well-nourished. No distress.  HENT:  Head: Normocephalic and atraumatic.  Eyes: Conjunctivae are normal. Pupils are equal, round, and reactive to light.  Neck: Normal range of motion.  Cardiovascular: Normal rate, regular rhythm, normal heart sounds and intact distal pulses.   Pulses:      Radial pulses are 2+ on the right side.  Hard to palpate left radial pulse. Good bruit and thrill to left upper extremity access.  Pulmonary/Chest: Effort normal.  Right IJ PermCath site healing well  Musculoskeletal: Normal range of motion. She exhibits  no edema.  Neurological: She is alert and oriented to person, place, and time.  Skin: Skin is warm and dry. She is not diaphoretic.     Left upper extremity incision healing well.  Psychiatric: She has a normal mood and affect. Her behavior is normal. Judgment and thought content normal.  Vitals reviewed.   BP (!) 165/66   Pulse 78   Resp 16   Wt 147 lb (66.7 kg)   BMI 28.71 kg/m   Past Medical History:  Diagnosis Date  . Anemia   . CHF (congestive heart failure) ([place])   . Chronic kidney disease    peritoneal dialysis  . Complication of anesthesia   . Coronary artery disease   . Diabetes mellitus without complication ([place])   . Dialysis patient [facility])    Peritoneal dialysis patient  . Dyspnea    doe  . Edema   . Hypercholesterolemia   . Hypertension   . Hypothyroidism   . Neuropathy   . PONV (postoperative nausea and vomiting)   . Shingles    October-November 2017  . Tremors of nervous system    intermittent when taking gabapentin    Social History   Social History  . Marital status: Married    Spouse name: N/A  . Number of children: N/A  . Years of education: N/A   Occupational History  . Not on file.   Social History Main Topics  . Smoking status: Former Smoker    Quit date: 10/09/2008  . Smokeless tobacco: Never Used  . Alcohol use No  . Drug  use: No  . Sexual activity: Yes   Other Topics Concern  . Not on file   Social History Narrative  . No narrative on file    Past Surgical History:  Procedure Laterality Date  . ABDOMINAL HYSTERECTOMY    . AV FISTULA PLACEMENT Left 11/30/2016   Procedure: ARTERIOVENOUS (AV) FISTULA CREATION ( EXPLORE FOR CREATION BRACHIOCEPHALIC);  Surgeon: Algernon Huxley, MD;  Location: [facility] ORS;  Service: Vascular;  Laterality: Left;  . CAPD INSERTION N/A 11/25/2015   Procedure: LAPAROSCOPIC INSERTION CONTINUOUS AMBULATORY PERITONEAL DIALYSIS  (CAPD) CATHETER;  Surgeon: Algernon Huxley, MD;  Location: [facility] ORS;  Service:  Vascular;  Laterality: N/A;  . CAPD INSERTION N/A 01/06/2016   Procedure: LAPAROSCOPIC INSERTION CONTINUOUS AMBULATORY PERITONEAL DIALYSIS  (CAPD) CATHETER REVISION ;  Surgeon: Algernon Huxley, MD;  Location: [facility] ORS;  Service: Vascular;  Laterality: N/A;  . CAPD INSERTION N/A 10/11/2016   Procedure: LAPAROSCOPIC INSERTION CONTINUOUS AMBULATORY PERITONEAL DIALYSIS  (CAPD) CATHETER ( REVISION );  Surgeon: Algernon Huxley, MD;  Location: [facility] ORS;  Service: Vascular;  Laterality: N/A;  . CARDIAC CATHETERIZATION    . CATARACT EXTRACTION W/PHACO Right 05/25/2016   Procedure: CATARACT EXTRACTION PHACO AND INTRAOCULAR LENS PLACEMENT (IOC);  Surgeon: Eulogio Bear, MD;  Location: [facility] ORS;  Service: Ophthalmology;  Laterality: Right;  Lot # [id number] H [place]: 00:5.2 AP%: 11.3 CDE: 7.21  . CORONARY ANGIOPLASTY    . CORONARY STENT INTERVENTION N/A 09/07/2016   Procedure: Coronary Stent Intervention;  Surgeon: Isaias Cowman, MD;  Location: [place] [facility];  Service: Cardiovascular;  Laterality: N/A;  . DIALYSIS/PERMA CATHETER INSERTION N/A 09/21/2016   Procedure: Dialysis/Perma Catheter Insertion;  Surgeon: Algernon Huxley, MD;  Location: [place] [facility];  Service: Cardiovascular;  Laterality: N/A;  . DIALYSIS/PERMA CATHETER REMOVAL Right 11/30/2016   Procedure: DIALYSIS/PERMA CATHETER REMOVAL;  Surgeon: Algernon Huxley, MD;  Location: [facility];  Service: Vascular;  Laterality: Right;  . EYE SURGERY    . LEFT HEART CATH AND CORONARY ANGIOGRAPHY Left 09/07/2016   Procedure: Left Heart Cath and Coronary Angiography;  Surgeon: Isaias Cowman, MD;  Location: [place] [facility];  Service: Cardiovascular;  Laterality: Left;  . TUBAL LIGATION      No family history on file.  Allergies  Allergen Reactions  . Penicillins     Childhood loss of consciousness  Has patient had a PCN reaction causing immediate rash, facial/tongue/throat swelling, SOB or lightheadedness with hypotension: Yes Has patient had a  PCN reaction causing severe rash involving mucus membranes or skin necrosis: No Has patient had a PCN reaction that required hospitalization Unknown Has patient had a PCN reaction occurring within the last 10 years: Unknown If all of the above answers are "NO", then may proceed with Cephalosporin use.   . Valacyclovir Nausea Only       Assessment & Plan:  Patient presents for her first postoperative visit. The patient is status post creation of a left brachiocephalic AV fistula and removal of a right IJ PermCath on 11/30/2016. Patient's postoperative course has been unremarkable. The patient presents today with a chief complaint of pain and numbness to her left fingers. Patient is also upset over the size of the incision located on her left upper extremity. She states that since surgery, her hand especially her fingers feel numb, tender and cold. She denies any ulceration to her fingers. Patient denies any issues with her peritoneal dialysis catheter. Patient denies any fever nausea vomiting.   1. Type  2 diabetes mellitus with complication, unspecified whether long term insulin use (HCC) - Stable Encouraged good control as its slows the progression of atherosclerotic disease  2. ESRD on dialysis [facility]) - stable Patient with nonfunctioning peritoneal dialysis catheter in place. Patient was newly created left upper extremity brachiocephalic AV fistula on 74/25/9563. Symptoms and physical exam are worrisome for steal. We'll order a stat steal study to assess arterial blood flow to the left hand. Right IJ PermCath site healing well.  - VAS [place] STEAL EXAM; Future  3. Peritoneal dialysis status ([place]) - stable Patient with nonfunctioning peritoneal dialysis catheter in place.   Current Outpatient Prescriptions on File Prior to Visit  Medication Sig Dispense Refill  . amLODipine (NORVASC) 5 MG tablet Take 5 mg by mouth daily.    Marland Kitchen aspirin EC 81 MG tablet Take 1 tablet (81 mg total) by mouth  daily. Start taking after NOV 15 (Patient taking differently: Take 81 mg by mouth daily. ) 30 tablet 0  . calcium carbonate (TUMS - DOSED IN MG ELEMENTAL CALCIUM) 500 MG chewable tablet Chew 2 tablets by mouth daily.    . carvedilol (COREG) 12.5 MG tablet Take 1 tablet (12.5 mg total) by mouth 2 (two) times daily with a meal. 60 tablet 0  . clopidogrel (PLAVIX) 75 MG tablet Take 1 tablet (75 mg total) by mouth daily with breakfast. 30 tablet 6  . dextromethorphan (DELSYM) 30 MG/5ML liquid Take 60 mg by mouth at bedtime.    Marland Kitchen Epoetin Alfa (EPOGEN IJ) Inject as directed every 30 (thirty) days.    . furosemide (LASIX) 80 MG tablet Take 80 mg by mouth 2 (two) times daily.    Marland Kitchen gentamicin cream (GARAMYCIN) 0.1 % Apply 1 application topically daily. (Patient taking differently: Apply 1 application topically daily. Catheter dressing changes) 15 g 0  . hydrALAZINE (APRESOLINE) 50 MG tablet Take 50 mg by mouth 2 (two) times daily.    Marland Kitchen HYDROcodone-acetaminophen (NORCO) 5-325 MG tablet Take 1 tablet by mouth every 6 (six) hours as needed for moderate pain. 30 tablet 0  . HYDROcodone-acetaminophen (NORCO) 5-325 MG tablet Take 1 tablet by mouth every 6 (six) hours as needed for moderate pain. 30 tablet 0  . insulin aspart (NOVOLOG FLEXPEN) 100 UNIT/ML FlexPen Inject 4-8 Units into the skin 3 (three) times daily with meals. Sliding scale below 200 take 4 units, 200-250=6 units, >250= 8 units    . Insulin Glargine (TOUJEO SOLOSTAR) 300 UNIT/ML SOPN Inject 20 Units into the skin at bedtime. (Patient taking differently: Inject 24 Units into the skin at bedtime. ) 30 pen 5  . levothyroxine (SYNTHROID, LEVOTHROID) 75 MCG tablet Take 75 mcg by mouth daily before breakfast.    . lisinopril (PRINIVIL,ZESTRIL) 10 MG tablet Take 10 mg by mouth daily.     Marland Kitchen lovastatin (MEVACOR) 40 MG tablet Take 40 mg by mouth daily with supper.     . metolazone (ZAROXOLYN) 5 MG tablet Take 5 mg by mouth daily.    . Multiple  Vitamins-Minerals (DIALYVITE 800/ULTRA D) TABS Take 1 tablet by mouth daily.    . sevelamer carbonate (RENVELA) 2.4 g PACK Take 2.4 g by mouth 3 (three) times daily with meals.    . vitamin B-12 (CYANOCOBALAMIN) 1000 MCG tablet Take 1,000 mcg by mouth daily.     No current facility-administered medications on file prior to visit.     There are no Patient Instructions on file for this visit. No Follow-up on file.   Kelden Lavallee  A Libra Gatz, PA-C

## 2017-01-02 DIAGNOSIS — Z992 Dependence on renal dialysis: Secondary | ICD-10-CM | POA: Diagnosis not present

## 2017-01-02 DIAGNOSIS — N186 End stage renal disease: Secondary | ICD-10-CM | POA: Diagnosis not present

## 2017-01-03 DIAGNOSIS — N186 End stage renal disease: Secondary | ICD-10-CM | POA: Diagnosis not present

## 2017-01-03 DIAGNOSIS — Z992 Dependence on renal dialysis: Secondary | ICD-10-CM | POA: Diagnosis not present

## 2017-01-04 ENCOUNTER — Other Ambulatory Visit (INDEPENDENT_AMBULATORY_CARE_PROVIDER_SITE_OTHER): Payer: Self-pay | Admitting: Vascular Surgery

## 2017-01-04 DIAGNOSIS — M79602 Pain in left arm: Secondary | ICD-10-CM

## 2017-01-04 DIAGNOSIS — N186 End stage renal disease: Secondary | ICD-10-CM | POA: Diagnosis not present

## 2017-01-04 DIAGNOSIS — Z992 Dependence on renal dialysis: Principal | ICD-10-CM

## 2017-01-05 ENCOUNTER — Ambulatory Visit (INDEPENDENT_AMBULATORY_CARE_PROVIDER_SITE_OTHER): Payer: Medicare HMO

## 2017-01-05 ENCOUNTER — Encounter (INDEPENDENT_AMBULATORY_CARE_PROVIDER_SITE_OTHER): Payer: Self-pay | Admitting: Vascular Surgery

## 2017-01-05 ENCOUNTER — Ambulatory Visit (INDEPENDENT_AMBULATORY_CARE_PROVIDER_SITE_OTHER): Payer: Medicare HMO | Admitting: Vascular Surgery

## 2017-01-05 VITALS — BP 141/58 | HR 72 | Resp 15 | Ht 60.0 in | Wt 140.0 lb

## 2017-01-05 DIAGNOSIS — M79602 Pain in left arm: Secondary | ICD-10-CM

## 2017-01-05 DIAGNOSIS — Z992 Dependence on renal dialysis: Secondary | ICD-10-CM

## 2017-01-05 DIAGNOSIS — N186 End stage renal disease: Secondary | ICD-10-CM

## 2017-01-05 DIAGNOSIS — E118 Type 2 diabetes mellitus with unspecified complications: Secondary | ICD-10-CM

## 2017-01-05 NOTE — Progress Notes (Signed)
Subjective:    Patient ID: Bethany Lee, female    DOB: 01-Jan-1951, 66 y.o.   MRN: [id number] Chief Complaint  Patient presents with  . Re-evaluation    ASAP steal ultrasound   Patient presents to review vascular studies. She was last seen on 01/01/2017 for her first postoperative visit. The patient is status post creation of a left brachiocephalic AV fistula and removal of a right IJ PermCath on 11/30/2016. Her postoperative course has been on eventful with the exception of what sounds like left upper extremity steal syndrome. Patient with continuous numbness to the left hand. She experiences numbness and pain especially when she raises her left upper extremity. Patient under went a left upper extremity AV hemodialysis access duplex to rule out steal. This study was notable for no internal vessel narrowing noted throughout the outflow vein. Significantly increased velocity at the distal upper arm outflow vein due to a change in vessel diameter. Left radial artery velocities indicate fistula steal. Patient denies any tissue loss left hand. Patient's PD catheter is functioning appropriately. At this time, the patient would like to switch care to Dr. Delana Meyer.    Review of Systems  Constitutional: Negative.   HENT: Negative.   Eyes: Negative.   Respiratory: Negative.   Cardiovascular:       Left upper extremity pain  Gastrointestinal: Negative.   Endocrine: Negative.   Genitourinary: Negative.   Musculoskeletal: Negative.   Skin: Negative.   Allergic/Immunologic: Negative.   Neurological: Negative.   Hematological: Negative.   Psychiatric/Behavioral: Negative.       Objective:   Physical Exam  Constitutional: She is oriented to person, place, and time. She appears well-developed and well-nourished. No distress.  HENT:  Head: Normocephalic and atraumatic.  Eyes: Conjunctivae are normal. Pupils are equal, round, and reactive to light.  Neck: Normal range of motion.    Cardiovascular: Normal rate, regular rhythm, normal heart sounds and intact distal pulses.   Pulses:      Radial pulses are 2+ on the right side, and 2+ on the left side.  Left upper extremity access: Good bruit and thrill.  Pulmonary/Chest: Effort normal.  Abdominal:  PD catheter intact no infection noted.  Musculoskeletal: Normal range of motion. She exhibits edema.  Neurological: She is alert and oriented to person, place, and time.  Skin: She is not diaphoretic.  Incision: Healing well. PD cath site: Healing well.  Vitals reviewed.  BP (!) 141/58 (BP Location: Right Arm)   Pulse 72   Resp 15   Ht 5' (1.524 m)   Wt 140 lb (63.5 kg)   BMI 27.34 kg/m   Past Medical History:  Diagnosis Date  . Anemia   . CHF (congestive heart failure) ([place])   . Chronic kidney disease    peritoneal dialysis  . Complication of anesthesia   . Coronary artery disease   . Diabetes mellitus without complication ([place])   . Dialysis patient [facility])    Peritoneal dialysis patient  . Dyspnea    doe  . Edema   . Hypercholesterolemia   . Hypertension   . Hypothyroidism   . Neuropathy   . PONV (postoperative nausea and vomiting)   . Shingles    October-November 2017  . Tremors of nervous system    intermittent when taking gabapentin    Social History   Social History  . Marital status: Married    Spouse name: N/A  . Number of children: N/A  . Years of education:  N/A   Occupational History  . Not on file.   Social History Main Topics  . Smoking status: Former Smoker    Quit date: 10/09/2008  . Smokeless tobacco: Never Used  . Alcohol use No  . Drug use: No  . Sexual activity: Yes   Other Topics Concern  . Not on file   Social History Narrative  . No narrative on file    Past Surgical History:  Procedure Laterality Date  . ABDOMINAL HYSTERECTOMY    . AV FISTULA PLACEMENT Left 11/30/2016   Procedure: ARTERIOVENOUS (AV) FISTULA CREATION ( EXPLORE FOR CREATION BRACHIOCEPHALIC);   Surgeon: Algernon Huxley, MD;  Location: [facility] ORS;  Service: Vascular;  Laterality: Left;  . CAPD INSERTION N/A 11/25/2015   Procedure: LAPAROSCOPIC INSERTION CONTINUOUS AMBULATORY PERITONEAL DIALYSIS  (CAPD) CATHETER;  Surgeon: Algernon Huxley, MD;  Location: [facility] ORS;  Service: Vascular;  Laterality: N/A;  . CAPD INSERTION N/A 01/06/2016   Procedure: LAPAROSCOPIC INSERTION CONTINUOUS AMBULATORY PERITONEAL DIALYSIS  (CAPD) CATHETER REVISION ;  Surgeon: Algernon Huxley, MD;  Location: [facility] ORS;  Service: Vascular;  Laterality: N/A;  . CAPD INSERTION N/A 10/11/2016   Procedure: LAPAROSCOPIC INSERTION CONTINUOUS AMBULATORY PERITONEAL DIALYSIS  (CAPD) CATHETER ( REVISION );  Surgeon: Algernon Huxley, MD;  Location: [facility] ORS;  Service: Vascular;  Laterality: N/A;  . CARDIAC CATHETERIZATION    . CATARACT EXTRACTION W/PHACO Right 05/25/2016   Procedure: CATARACT EXTRACTION PHACO AND INTRAOCULAR LENS PLACEMENT (IOC);  Surgeon: Eulogio Bear, MD;  Location: [facility] ORS;  Service: Ophthalmology;  Laterality: Right;  Lot # [id number] H [place]: 00:5.2 AP%: 11.3 CDE: 7.21  . CORONARY ANGIOPLASTY    . CORONARY STENT INTERVENTION N/A 09/07/2016   Procedure: Coronary Stent Intervention;  Surgeon: Isaias Cowman, MD;  Location: [place] [facility];  Service: Cardiovascular;  Laterality: N/A;  . DIALYSIS/PERMA CATHETER INSERTION N/A 09/21/2016   Procedure: Dialysis/Perma Catheter Insertion;  Surgeon: Algernon Huxley, MD;  Location: [place] [facility];  Service: Cardiovascular;  Laterality: N/A;  . DIALYSIS/PERMA CATHETER REMOVAL Right 11/30/2016   Procedure: DIALYSIS/PERMA CATHETER REMOVAL;  Surgeon: Algernon Huxley, MD;  Location: [facility];  Service: Vascular;  Laterality: Right;  . EYE SURGERY    . LEFT HEART CATH AND CORONARY ANGIOGRAPHY Left 09/07/2016   Procedure: Left Heart Cath and Coronary Angiography;  Surgeon: Isaias Cowman, MD;  Location: [place] [facility];  Service: Cardiovascular;  Laterality: Left;  . TUBAL LIGATION       No family history on file.  Allergies  Allergen Reactions  . Penicillins     Childhood loss of consciousness  Has patient had a PCN reaction causing immediate rash, facial/tongue/throat swelling, SOB or lightheadedness with hypotension: Yes Has patient had a PCN reaction causing severe rash involving mucus membranes or skin necrosis: No Has patient had a PCN reaction that required hospitalization Unknown Has patient had a PCN reaction occurring within the last 10 years: Unknown If all of the above answers are "NO", then may proceed with Cephalosporin use.   . Valacyclovir Nausea Only       Assessment & Plan:  Patient presents to review vascular studies. She was last seen on 01/01/2017 for her first postoperative visit. The patient is status post creation of a left brachiocephalic AV fistula and removal of a right IJ PermCath on 11/30/2016. Her postoperative course has been on eventful with the exception of what sounds like left upper extremity steal syndrome. Patient with continuous numbness to the left hand.  She experiences numbness and pain especially when she raises her left upper extremity. Patient under went a left upper extremity AV hemodialysis access duplex to rule out steal. This study was notable for no internal vessel narrowing noted throughout the outflow vein. Significantly increased velocity at the distal upper arm outflow vein due to a change in vessel diameter. Left radial artery velocities indicate fistula steal. Patient denies any tissue loss left hand. Patient's PD catheter is functioning appropriately. At this time, the patient would like to switch care to Dr. Delana Meyer.   1. Type 2 diabetes mellitus with complication, unspecified whether long term insulin use (HCC) - Stable Encouraged good control as its slows the progression of atherosclerotic disease  2. ESRD on dialysis ([place]) - stable Patient's postoperative course with symptoms possible for left upper extremity  steal status post brachiocephalic AV fistula creation. Patient underwent HDA which was notable for steal. Recommend a left upper extremity fistulogram and attempt to revascularize the fistula. Procedure, risks and benefits explained to the patient. All questions answered Patient would like to proceed At this time, the patient would like to switch care to Dr. Delana Meyer  Current Outpatient Prescriptions on File Prior to Visit  Medication Sig Dispense Refill  . amLODipine (NORVASC) 5 MG tablet Take 5 mg by mouth daily.    Marland Kitchen aspirin EC 81 MG tablet Take 1 tablet (81 mg total) by mouth daily. Start taking after NOV 15 (Patient taking differently: Take 81 mg by mouth daily. ) 30 tablet 0  . calcium carbonate (TUMS - DOSED IN MG ELEMENTAL CALCIUM) 500 MG chewable tablet Chew 2 tablets by mouth daily.    . carvedilol (COREG) 12.5 MG tablet Take 1 tablet (12.5 mg total) by mouth 2 (two) times daily with a meal. 60 tablet 0  . clopidogrel (PLAVIX) 75 MG tablet Take 1 tablet (75 mg total) by mouth daily with breakfast. 30 tablet 6  . dextromethorphan (DELSYM) 30 MG/5ML liquid Take 60 mg by mouth at bedtime.    Marland Kitchen Epoetin Alfa (EPOGEN IJ) Inject as directed every 30 (thirty) days.     . furosemide (LASIX) 80 MG tablet Take 80 mg by mouth 2 (two) times daily.    Marland Kitchen gentamicin cream (GARAMYCIN) 0.1 % Apply 1 application topically daily. (Patient taking differently: Apply 1 application topically daily. Catheter dressing changes) 15 g 0  . hydrALAZINE (APRESOLINE) 50 MG tablet Take 50 mg by mouth 2 (two) times daily.    . insulin aspart (NOVOLOG FLEXPEN) 100 UNIT/ML FlexPen Inject 4-8 Units into the skin 3 (three) times daily with meals. Sliding scale below 200 take 4 units, 200-250=6 units, >250= 8 units    . Insulin Glargine (TOUJEO SOLOSTAR) 300 UNIT/ML SOPN Inject 20 Units into the skin at bedtime. (Patient taking differently: Inject 24 Units into the skin at bedtime. ) 30 pen 5  . levothyroxine (SYNTHROID,  LEVOTHROID) 75 MCG tablet Take 75 mcg by mouth daily before breakfast.    . lisinopril (PRINIVIL,ZESTRIL) 10 MG tablet Take 10 mg by mouth daily.     Marland Kitchen lovastatin (MEVACOR) 40 MG tablet Take 40 mg by mouth daily with supper.     . metolazone (ZAROXOLYN) 5 MG tablet Take 5 mg by mouth daily.    . Multiple Vitamins-Minerals (DIALYVITE 800/ULTRA D) TABS Take 1 tablet by mouth daily.    . sevelamer carbonate (RENVELA) 2.4 g PACK Take 2.4 g by mouth 3 (three) times daily with meals.    . vitamin B-12 (CYANOCOBALAMIN) 1000  MCG tablet Take 1,000 mcg by mouth daily.    Marland Kitchen HYDROcodone-acetaminophen (NORCO) 5-325 MG tablet Take 1 tablet by mouth every 6 (six) hours as needed for moderate pain. (Patient not taking: Reported on 01/05/2017) 30 tablet 0  . HYDROcodone-acetaminophen (NORCO) 5-325 MG tablet Take 1 tablet by mouth every 6 (six) hours as needed for moderate pain. (Patient not taking: Reported on 01/05/2017) 30 tablet 0   No current facility-administered medications on file prior to visit.    There are no Patient Instructions on file for this visit. No Follow-up on file.  Akiera Allbaugh A Mcguire Gasparyan, PA-C

## 2017-01-06 DIAGNOSIS — Z992 Dependence on renal dialysis: Secondary | ICD-10-CM | POA: Diagnosis not present

## 2017-01-06 DIAGNOSIS — N186 End stage renal disease: Secondary | ICD-10-CM | POA: Diagnosis not present

## 2017-01-07 DIAGNOSIS — N186 End stage renal disease: Secondary | ICD-10-CM | POA: Diagnosis not present

## 2017-01-07 DIAGNOSIS — Z992 Dependence on renal dialysis: Secondary | ICD-10-CM | POA: Diagnosis not present

## 2017-01-08 ENCOUNTER — Telehealth (INDEPENDENT_AMBULATORY_CARE_PROVIDER_SITE_OTHER): Payer: Self-pay

## 2017-01-08 DIAGNOSIS — Z992 Dependence on renal dialysis: Secondary | ICD-10-CM | POA: Diagnosis not present

## 2017-01-08 DIAGNOSIS — N186 End stage renal disease: Secondary | ICD-10-CM | POA: Diagnosis not present

## 2017-01-08 NOTE — Telephone Encounter (Signed)
Attempted to contact the patient to schedule her for a fistulagram and had to leave a message for her to call back to be scheduled.

## 2017-01-09 DIAGNOSIS — Z992 Dependence on renal dialysis: Secondary | ICD-10-CM | POA: Diagnosis not present

## 2017-01-09 DIAGNOSIS — N186 End stage renal disease: Secondary | ICD-10-CM | POA: Diagnosis not present

## 2017-01-10 DIAGNOSIS — N186 End stage renal disease: Secondary | ICD-10-CM | POA: Diagnosis not present

## 2017-01-10 DIAGNOSIS — Z992 Dependence on renal dialysis: Secondary | ICD-10-CM | POA: Diagnosis not present

## 2017-01-11 DIAGNOSIS — N186 End stage renal disease: Secondary | ICD-10-CM | POA: Diagnosis not present

## 2017-01-11 DIAGNOSIS — Z992 Dependence on renal dialysis: Secondary | ICD-10-CM | POA: Diagnosis not present

## 2017-01-12 DIAGNOSIS — N186 End stage renal disease: Secondary | ICD-10-CM | POA: Diagnosis not present

## 2017-01-12 DIAGNOSIS — Z992 Dependence on renal dialysis: Secondary | ICD-10-CM | POA: Diagnosis not present

## 2017-01-13 DIAGNOSIS — Z992 Dependence on renal dialysis: Secondary | ICD-10-CM | POA: Diagnosis not present

## 2017-01-13 DIAGNOSIS — N186 End stage renal disease: Secondary | ICD-10-CM | POA: Diagnosis not present

## 2017-01-14 DIAGNOSIS — N186 End stage renal disease: Secondary | ICD-10-CM | POA: Diagnosis not present

## 2017-01-14 DIAGNOSIS — Z992 Dependence on renal dialysis: Secondary | ICD-10-CM | POA: Diagnosis not present

## 2017-01-15 ENCOUNTER — Other Ambulatory Visit (INDEPENDENT_AMBULATORY_CARE_PROVIDER_SITE_OTHER): Payer: Self-pay | Admitting: Vascular Surgery

## 2017-01-15 DIAGNOSIS — Z992 Dependence on renal dialysis: Secondary | ICD-10-CM | POA: Diagnosis not present

## 2017-01-15 DIAGNOSIS — N186 End stage renal disease: Secondary | ICD-10-CM | POA: Diagnosis not present

## 2017-01-15 MED ORDER — CLINDAMYCIN PHOSPHATE 300 MG/50ML IV SOLN
300.0000 mg | Freq: Once | INTRAVENOUS | Status: AC
  Administered 2017-01-16: 300 mg via INTRAVENOUS

## 2017-01-16 ENCOUNTER — Encounter: Payer: Self-pay | Admitting: Vascular Surgery

## 2017-01-16 ENCOUNTER — Encounter (INDEPENDENT_AMBULATORY_CARE_PROVIDER_SITE_OTHER): Payer: Self-pay

## 2017-01-16 ENCOUNTER — Encounter
Admission: RE | Disposition: A | Discharge status: Home / Self Care | Payer: Self-pay | Source: Ambulatory Visit | Attending: Vascular Surgery

## 2017-01-16 ENCOUNTER — Other Ambulatory Visit (INDEPENDENT_AMBULATORY_CARE_PROVIDER_SITE_OTHER): Payer: Self-pay | Admitting: Vascular Surgery

## 2017-01-16 ENCOUNTER — Ambulatory Visit
Admission: RE | Admit: 2017-01-16 | Discharge: 2017-01-16 | Disposition: A | Discharge status: Home / Self Care | Payer: Medicare HMO | Source: Ambulatory Visit | Attending: Vascular Surgery | Admitting: Vascular Surgery

## 2017-01-16 DIAGNOSIS — Y832 Surgical operation with anastomosis, bypass or graft as the cause of abnormal reaction of the patient, or of later complication, without mention of misadventure at the time of the procedure: Secondary | ICD-10-CM | POA: Insufficient documentation

## 2017-01-16 DIAGNOSIS — D631 Anemia in chronic kidney disease: Secondary | ICD-10-CM | POA: Insufficient documentation

## 2017-01-16 DIAGNOSIS — Z87891 Personal history of nicotine dependence: Secondary | ICD-10-CM | POA: Insufficient documentation

## 2017-01-16 DIAGNOSIS — I2510 Atherosclerotic heart disease of native coronary artery without angina pectoris: Secondary | ICD-10-CM | POA: Diagnosis not present

## 2017-01-16 DIAGNOSIS — Z955 Presence of coronary angioplasty implant and graft: Secondary | ICD-10-CM | POA: Insufficient documentation

## 2017-01-16 DIAGNOSIS — I132 Hypertensive heart and chronic kidney disease with heart failure and with stage 5 chronic kidney disease, or end stage renal disease: Secondary | ICD-10-CM | POA: Diagnosis not present

## 2017-01-16 DIAGNOSIS — Z888 Allergy status to other drugs, medicaments and biological substances status: Secondary | ICD-10-CM | POA: Diagnosis not present

## 2017-01-16 DIAGNOSIS — N186 End stage renal disease: Secondary | ICD-10-CM | POA: Insufficient documentation

## 2017-01-16 DIAGNOSIS — Z90710 Acquired absence of both cervix and uterus: Secondary | ICD-10-CM | POA: Diagnosis not present

## 2017-01-16 DIAGNOSIS — Z880 Allergy status to penicillin: Secondary | ICD-10-CM | POA: Insufficient documentation

## 2017-01-16 DIAGNOSIS — T82868A Thrombosis of vascular prosthetic devices, implants and grafts, initial encounter: Secondary | ICD-10-CM | POA: Diagnosis not present

## 2017-01-16 DIAGNOSIS — Z7982 Long term (current) use of aspirin: Secondary | ICD-10-CM | POA: Insufficient documentation

## 2017-01-16 DIAGNOSIS — E7800 Pure hypercholesterolemia, unspecified: Secondary | ICD-10-CM | POA: Insufficient documentation

## 2017-01-16 DIAGNOSIS — Z992 Dependence on renal dialysis: Secondary | ICD-10-CM | POA: Diagnosis not present

## 2017-01-16 DIAGNOSIS — E1122 Type 2 diabetes mellitus with diabetic chronic kidney disease: Secondary | ICD-10-CM | POA: Diagnosis not present

## 2017-01-16 DIAGNOSIS — Z794 Long term (current) use of insulin: Secondary | ICD-10-CM | POA: Diagnosis not present

## 2017-01-16 DIAGNOSIS — Z9889 Other specified postprocedural states: Secondary | ICD-10-CM | POA: Diagnosis not present

## 2017-01-16 DIAGNOSIS — T82858A Stenosis of vascular prosthetic devices, implants and grafts, initial encounter: Secondary | ICD-10-CM | POA: Diagnosis not present

## 2017-01-16 DIAGNOSIS — Z9841 Cataract extraction status, right eye: Secondary | ICD-10-CM | POA: Diagnosis not present

## 2017-01-16 DIAGNOSIS — Z7902 Long term (current) use of antithrombotics/antiplatelets: Secondary | ICD-10-CM | POA: Insufficient documentation

## 2017-01-16 DIAGNOSIS — J9602 Acute respiratory failure with hypercapnia: Secondary | ICD-10-CM

## 2017-01-16 DIAGNOSIS — E039 Hypothyroidism, unspecified: Secondary | ICD-10-CM | POA: Insufficient documentation

## 2017-01-16 DIAGNOSIS — J9601 Acute respiratory failure with hypoxia: Secondary | ICD-10-CM

## 2017-01-16 DIAGNOSIS — E1140 Type 2 diabetes mellitus with diabetic neuropathy, unspecified: Secondary | ICD-10-CM | POA: Insufficient documentation

## 2017-01-16 DIAGNOSIS — I509 Heart failure, unspecified: Secondary | ICD-10-CM | POA: Insufficient documentation

## 2017-01-16 HISTORY — PX: A/V FISTULAGRAM: CATH118298

## 2017-01-16 HISTORY — PX: A/V SHUNT INTERVENTION: CATH118220

## 2017-01-16 LAB — GLUCOSE, CAPILLARY: Glucose-Capillary: 100 mg/dL — ABNORMAL HIGH (ref 65–99)

## 2017-01-16 LAB — POTASSIUM (ARMC VASCULAR LAB ONLY): Potassium (ARMC vascular lab): 4.2 (ref 3.5–5.1)

## 2017-01-16 SURGERY — A/V FISTULAGRAM
Anesthesia: Moderate Sedation | Site: Arm Upper

## 2017-01-16 MED ORDER — MIDAZOLAM HCL 5 MG/5ML IJ SOLN
INTRAMUSCULAR | Status: AC
  Filled 2017-01-16: qty 5

## 2017-01-16 MED ORDER — CLINDAMYCIN PHOSPHATE 300 MG/50ML IV SOLN
INTRAVENOUS | Status: AC
Start: 2017-01-16 — End: 2017-01-16
  Administered 2017-01-16: 300 mg via INTRAVENOUS
  Filled 2017-01-16: qty 50

## 2017-01-16 MED ORDER — HEPARIN (PORCINE) IN NACL 2-0.9 UNIT/ML-% IJ SOLN
INTRAMUSCULAR | Status: AC
  Filled 2017-01-16: qty 1000

## 2017-01-16 MED ORDER — HYDROMORPHONE HCL 1 MG/ML IJ SOLN: 1.0000 mg | Freq: Once | INTRAMUSCULAR | Status: DC | PRN

## 2017-01-16 MED ORDER — LIDOCAINE HCL (PF) 1 % IJ SOLN
INTRAMUSCULAR | Status: AC
  Filled 2017-01-16: qty 30

## 2017-01-16 MED ORDER — IOPAMIDOL (ISOVUE-300) INJECTION 61%
INTRAVENOUS | Status: DC | PRN
  Administered 2017-01-16: 70 mL via INTRA_ARTERIAL

## 2017-01-16 MED ORDER — ONDANSETRON HCL 4 MG/2ML IJ SOLN: 4.0000 mg | Freq: Four times a day (QID) | INTRAMUSCULAR | Status: DC | PRN

## 2017-01-16 MED ORDER — FAMOTIDINE 20 MG PO TABS: 40.0000 mg | ORAL_TABLET | ORAL | Status: DC | PRN

## 2017-01-16 MED ORDER — FENTANYL CITRATE (PF) 100 MCG/2ML IJ SOLN
INTRAMUSCULAR | Status: AC
  Filled 2017-01-16: qty 2

## 2017-01-16 MED ORDER — METHYLPREDNISOLONE SODIUM SUCC 125 MG IJ SOLR: 125.0000 mg | INTRAMUSCULAR | Status: DC | PRN

## 2017-01-16 MED ORDER — SODIUM CHLORIDE 0.9 % IV SOLN
INTRAVENOUS | Status: DC
  Administered 2017-01-16: 09:00:00 via INTRAVENOUS

## 2017-01-16 MED ORDER — HEPARIN SODIUM (PORCINE) 1000 UNIT/ML IJ SOLN
INTRAMUSCULAR | Status: AC
  Filled 2017-01-16: qty 1

## 2017-01-16 MED ORDER — MIDAZOLAM HCL 2 MG/2ML IJ SOLN
INTRAMUSCULAR | Status: DC | PRN
  Administered 2017-01-16: 2 mg via INTRAVENOUS

## 2017-01-16 MED ORDER — FENTANYL CITRATE (PF) 100 MCG/2ML IJ SOLN
INTRAMUSCULAR | Status: DC | PRN
  Administered 2017-01-16: 50 ug via INTRAVENOUS

## 2017-01-16 SURGICAL SUPPLY — 9 items
CATH BEACON 5.038 65CM KMP-01 (CATHETERS) ×3 IMPLANT
DEVICE TORQUE (MISCELLANEOUS) ×3 IMPLANT
DRAPE BRACHIAL (DRAPES) ×3 IMPLANT
GUIDEWIRE ZIPWIRE .035X180CM (WIRE) ×3 IMPLANT
NEEDLE ENTRY 21GA 7CM ECHOTIP (NEEDLE) ×3 IMPLANT
PACK ANGIOGRAPHY (CUSTOM PROCEDURE TRAY) ×3 IMPLANT
SET INTRO CAPELLA COAXIAL (SET/KITS/TRAYS/PACK) ×3 IMPLANT
SHEATH BRITE TIP 6FRX5.5 (SHEATH) ×3 IMPLANT
SUT MNCRL AB 4-0 PS2 18 (SUTURE) ×3 IMPLANT

## 2017-01-16 NOTE — H&P (Signed)
[facility] History & Physical Update  The patient was interviewed and re-examined.  The patient's previous History and Physical has been reviewed and is unchanged.  There is no change in the plan of care. We plan to proceed with the scheduled procedure.  Hortencia Pilar, MD  01/16/2017, 8:15 AM

## 2017-01-16 NOTE — Op Note (Signed)
OPERATIVE NOTE   PROCEDURE: 1. Placement of catheter into aortic arch from access in AV fistula 2. Contrast injection left arm arterial with distal runoff to the hand from the aortic arch 3. Contrast injection AV fistula and central venous anatomy  PRE-OPERATIVE DIAGNOSIS: Complication of dialysis access with left hand pain and steel as well as stricture within the AV fistula                                                       End Stage Renal Disease  POST-OPERATIVE DIAGNOSIS: same as above   SURGEON: Katha Cabal, M.D.  ANESTHESIA: Conscious sedation was administered under my direct supervision by the interventional radiology RN.  IV Versed plus fentanyl were utilized. Continuous ECG, pulse oximetry and blood pressure was monitored throughout the entire procedure.  Conscious sedation was for a total of 38 minutes.  ESTIMATED BLOOD LOSS: minimal  FINDING(S): 1. Tandem stenoses within the AV fistula. 80% stenosis at the origin of the left subclavian. 70% stenosis just proximal to the AV anastomosis. Diffuse small vessel disease to the hand with dominant runoff via the radial artery  SPECIMEN(S):  None  CONTRAST: 70 cc  FLUOROSCOPY TIME: 7.9 minutes  INDICATIONS: Bethany Lee is a 66 y.o. female who  presents with malfunctioning left brachiocephalic AV access.  The patient is scheduled for angiography with possible intervention of the AV access to prevent loss of the permanent access.  The patient is aware the risks include but are not limited to: bleeding, infection, thrombosis of the cannulated access, and possible anaphylactic reaction to the contrast.  The patient acknowledges if the access can not be salvaged a tunneled catheter will be needed and will be placed during this procedure.  The patient is aware of the risks of the procedure and elects to proceed with the angiogram and intervention.  DESCRIPTION: After full informed written consent was obtained, the patient  was brought back to the Special Procedure suite and placed supine position.  Appropriate cardiopulmonary monitors were placed.  The left arm was prepped and draped in the standard fashion.  Appropriate timeout is called.   The left brachiocephalic AV access was cannulated with a micropuncture needle under ultrasound guidence in a retrograde direction.  Ultrasound was used to evaluate the brachiocephalic AV access.  It was echolucent and compressible indicating it is patent .  An ultrasound image was acquired for the permanent record.  A micropuncture needle was used to access the brachiocephalic AV access under direct ultrasound guidance.  The microwire was then advanced under fluoroscopic guidance without difficulty followed by the micro-sheath.  The J-wire was then advanced and a 6 Fr sheath inserted.    Using a combination of a stiff ZIP wire and a KMP catheter the wire and catheter were negotiated from the AV fistula all the way into the aortic arch. Hand injection of contrast was then used to create imaging from the aortic arch all the way to the fingers through the KMP catheter.  Hand injections were also used to completed the image of the access from the arterial anastomosis through the entire access.  The central venous structures were also imaged by hand injections.  A 4-0 Monocryl purse-string suture was sewn around the sheath.  The sheath was removed and light pressure was applied.  A sterile bandage was applied to the puncture site.   Interpretation:  Based on the images, no intervention will be performed today as the patient has not been properly informed for arterial intervention however, the patient will require and plasty and stenting of the origin of the subclavian artery as there is an 80% stenosis at this location as well as angioplasty of the distal brachial artery near the arterial anastomosis for the AV access as this represents a 70% stenosis and then treatment of the tandem  stenoses within the AV fistula itself.  There is a moderate stenosis of the proximal subclavian vein as it courses underneath the clavicle the innominate vein and superior vena cava. Widely patent. With respect to the arterial system the radial artery is dominant there is diffuse disease throughout the course of the ulnar artery and the palmar arch appears to be discontinuous at the level of the fourth and fifth fingers. The radial artery does appear to fill all 5 digits.   COMPLICATIONS: None  CONDITION: Bethany Lee, M.D [facility] Office: [phone]  01/16/2017 10:22 AM

## 2017-01-16 NOTE — Discharge Instructions (Signed)

## 2017-01-16 NOTE — Progress Notes (Signed)
Patient clinically stable post fistulogram,husband at bedside. Denies complaints, Dr Delana Meyer out to speak with patient and husband at length with questions answered,vitals stable.discharge instructions given with questions answered,

## 2017-01-17 DIAGNOSIS — N186 End stage renal disease: Secondary | ICD-10-CM | POA: Diagnosis not present

## 2017-01-17 DIAGNOSIS — Z992 Dependence on renal dialysis: Secondary | ICD-10-CM | POA: Diagnosis not present

## 2017-01-18 DIAGNOSIS — Z992 Dependence on renal dialysis: Secondary | ICD-10-CM | POA: Diagnosis not present

## 2017-01-18 DIAGNOSIS — N186 End stage renal disease: Secondary | ICD-10-CM | POA: Diagnosis not present

## 2017-01-18 MED ORDER — CLINDAMYCIN PHOSPHATE 300 MG/50ML IV SOLN
300.0000 mg | Freq: Once | INTRAVENOUS | Status: AC
  Administered 2017-01-19: 300 mg via INTRAVENOUS

## 2017-01-19 ENCOUNTER — Ambulatory Visit
Admission: RE | Admit: 2017-01-19 | Discharge: 2017-01-19 | Disposition: A | Discharge status: Home / Self Care | Payer: Medicare HMO | Source: Ambulatory Visit | Attending: Vascular Surgery | Admitting: Vascular Surgery

## 2017-01-19 ENCOUNTER — Encounter
Admission: RE | Disposition: A | Discharge status: Home / Self Care | Payer: Self-pay | Source: Ambulatory Visit | Attending: Vascular Surgery

## 2017-01-19 DIAGNOSIS — Z955 Presence of coronary angioplasty implant and graft: Secondary | ICD-10-CM | POA: Insufficient documentation

## 2017-01-19 DIAGNOSIS — N186 End stage renal disease: Secondary | ICD-10-CM | POA: Insufficient documentation

## 2017-01-19 DIAGNOSIS — E039 Hypothyroidism, unspecified: Secondary | ICD-10-CM | POA: Diagnosis not present

## 2017-01-19 DIAGNOSIS — D631 Anemia in chronic kidney disease: Secondary | ICD-10-CM | POA: Insufficient documentation

## 2017-01-19 DIAGNOSIS — Z87891 Personal history of nicotine dependence: Secondary | ICD-10-CM | POA: Insufficient documentation

## 2017-01-19 DIAGNOSIS — I509 Heart failure, unspecified: Secondary | ICD-10-CM | POA: Diagnosis not present

## 2017-01-19 DIAGNOSIS — I771 Stricture of artery: Secondary | ICD-10-CM | POA: Diagnosis not present

## 2017-01-19 DIAGNOSIS — Z90710 Acquired absence of both cervix and uterus: Secondary | ICD-10-CM | POA: Diagnosis not present

## 2017-01-19 DIAGNOSIS — Z8619 Personal history of other infectious and parasitic diseases: Secondary | ICD-10-CM | POA: Insufficient documentation

## 2017-01-19 DIAGNOSIS — Z880 Allergy status to penicillin: Secondary | ICD-10-CM | POA: Insufficient documentation

## 2017-01-19 DIAGNOSIS — E1140 Type 2 diabetes mellitus with diabetic neuropathy, unspecified: Secondary | ICD-10-CM | POA: Diagnosis not present

## 2017-01-19 DIAGNOSIS — T82858A Stenosis of vascular prosthetic devices, implants and grafts, initial encounter: Secondary | ICD-10-CM | POA: Diagnosis not present

## 2017-01-19 DIAGNOSIS — I132 Hypertensive heart and chronic kidney disease with heart failure and with stage 5 chronic kidney disease, or end stage renal disease: Secondary | ICD-10-CM | POA: Diagnosis not present

## 2017-01-19 DIAGNOSIS — Z794 Long term (current) use of insulin: Secondary | ICD-10-CM | POA: Diagnosis not present

## 2017-01-19 DIAGNOSIS — R251 Tremor, unspecified: Secondary | ICD-10-CM | POA: Insufficient documentation

## 2017-01-19 DIAGNOSIS — Y832 Surgical operation with anastomosis, bypass or graft as the cause of abnormal reaction of the patient, or of later complication, without mention of misadventure at the time of the procedure: Secondary | ICD-10-CM | POA: Insufficient documentation

## 2017-01-19 DIAGNOSIS — Z9841 Cataract extraction status, right eye: Secondary | ICD-10-CM | POA: Diagnosis not present

## 2017-01-19 DIAGNOSIS — Z9889 Other specified postprocedural states: Secondary | ICD-10-CM | POA: Insufficient documentation

## 2017-01-19 DIAGNOSIS — Z7982 Long term (current) use of aspirin: Secondary | ICD-10-CM | POA: Insufficient documentation

## 2017-01-19 DIAGNOSIS — G458 Other transient cerebral ischemic attacks and related syndromes: Secondary | ICD-10-CM | POA: Diagnosis not present

## 2017-01-19 DIAGNOSIS — Z7902 Long term (current) use of antithrombotics/antiplatelets: Secondary | ICD-10-CM | POA: Insufficient documentation

## 2017-01-19 DIAGNOSIS — E7800 Pure hypercholesterolemia, unspecified: Secondary | ICD-10-CM | POA: Diagnosis not present

## 2017-01-19 DIAGNOSIS — Z992 Dependence on renal dialysis: Secondary | ICD-10-CM | POA: Insufficient documentation

## 2017-01-19 DIAGNOSIS — Z881 Allergy status to other antibiotic agents status: Secondary | ICD-10-CM | POA: Insufficient documentation

## 2017-01-19 DIAGNOSIS — I708 Atherosclerosis of other arteries: Secondary | ICD-10-CM | POA: Diagnosis not present

## 2017-01-19 DIAGNOSIS — R0609 Other forms of dyspnea: Secondary | ICD-10-CM | POA: Insufficient documentation

## 2017-01-19 DIAGNOSIS — E1122 Type 2 diabetes mellitus with diabetic chronic kidney disease: Secondary | ICD-10-CM | POA: Diagnosis not present

## 2017-01-19 HISTORY — PX: UPPER EXTREMITY ANGIOGRAPHY: CATH118270

## 2017-01-19 LAB — GLUCOSE, CAPILLARY: GLUCOSE-CAPILLARY: 94 mg/dL (ref 65–99)

## 2017-01-19 LAB — POTASSIUM (ARMC VASCULAR LAB ONLY): Potassium (ARMC vascular lab): 4.7 (ref 3.5–5.1)

## 2017-01-19 SURGERY — UPPER EXTREMITY ANGIOGRAPHY
Anesthesia: Moderate Sedation | Laterality: Left

## 2017-01-19 MED ORDER — LABETALOL HCL 5 MG/ML IV SOLN: 10.0000 mg | INTRAVENOUS | Status: DC | PRN

## 2017-01-19 MED ORDER — HEPARIN SODIUM (PORCINE) 1000 UNIT/ML IJ SOLN
INTRAMUSCULAR | Status: DC | PRN
  Administered 2017-01-19: 5000 [IU] via INTRAVENOUS

## 2017-01-19 MED ORDER — SODIUM CHLORIDE 0.9 % IV SOLN
INTRAVENOUS | Status: DC
  Administered 2017-01-19: 10:00:00 via INTRAVENOUS

## 2017-01-19 MED ORDER — MIDAZOLAM HCL 2 MG/2ML IJ SOLN
INTRAMUSCULAR | Status: DC | PRN
  Administered 2017-01-19 (×3): 1 mg via INTRAVENOUS

## 2017-01-19 MED ORDER — METOPROLOL TARTRATE 5 MG/5ML IV SOLN: 2.0000 mg | INTRAVENOUS | Status: DC | PRN

## 2017-01-19 MED ORDER — HYDROMORPHONE HCL 1 MG/ML IJ SOLN: 0.5000 mg | INTRAMUSCULAR | Status: DC | PRN

## 2017-01-19 MED ORDER — METHYLPREDNISOLONE SODIUM SUCC 125 MG IJ SOLR: 125.0000 mg | INTRAMUSCULAR | Status: DC | PRN

## 2017-01-19 MED ORDER — ACETAMINOPHEN 325 MG PO TABS: 325.0000 mg | ORAL_TABLET | ORAL | Status: DC | PRN

## 2017-01-19 MED ORDER — IOPAMIDOL (ISOVUE-300) INJECTION 61%
INTRAVENOUS | Status: DC | PRN
  Administered 2017-01-19: 55 mL via INTRA_ARTERIAL

## 2017-01-19 MED ORDER — HYDROMORPHONE HCL 1 MG/ML IJ SOLN: 1.0000 mg | Freq: Once | INTRAMUSCULAR | Status: DC | PRN

## 2017-01-19 MED ORDER — OXYCODONE HCL 5 MG PO TABS: 5.0000 mg | ORAL_TABLET | ORAL | Status: DC | PRN

## 2017-01-19 MED ORDER — HEPARIN SODIUM (PORCINE) 1000 UNIT/ML IJ SOLN
INTRAMUSCULAR | Status: AC
  Filled 2017-01-19: qty 1

## 2017-01-19 MED ORDER — ONDANSETRON HCL 4 MG/2ML IJ SOLN: 4.0000 mg | Freq: Four times a day (QID) | INTRAMUSCULAR | Status: DC | PRN

## 2017-01-19 MED ORDER — FENTANYL CITRATE (PF) 100 MCG/2ML IJ SOLN
INTRAMUSCULAR | Status: DC | PRN
  Administered 2017-01-19 (×3): 50 ug via INTRAVENOUS

## 2017-01-19 MED ORDER — CLINDAMYCIN PHOSPHATE 300 MG/50ML IV SOLN
INTRAVENOUS | Status: AC
  Administered 2017-01-19: 300 mg via INTRAVENOUS
  Filled 2017-01-19: qty 50

## 2017-01-19 MED ORDER — ALUM & MAG HYDROXIDE-SIMETH 200-200-20 MG/5ML PO SUSP: 15.0000 mL | ORAL | Status: DC | PRN

## 2017-01-19 MED ORDER — FENTANYL CITRATE (PF) 100 MCG/2ML IJ SOLN
INTRAMUSCULAR | Status: AC
  Filled 2017-01-19: qty 2

## 2017-01-19 MED ORDER — LIDOCAINE-EPINEPHRINE (PF) 2 %-1:200000 IJ SOLN
INTRAMUSCULAR | Status: AC
  Filled 2017-01-19: qty 20

## 2017-01-19 MED ORDER — FAMOTIDINE 20 MG PO TABS: 40.0000 mg | ORAL_TABLET | ORAL | Status: DC | PRN

## 2017-01-19 MED ORDER — ACETAMINOPHEN 325 MG RE SUPP
325.0000 mg | RECTAL | Status: DC | PRN
  Filled 2017-01-19: qty 2

## 2017-01-19 MED ORDER — HEPARIN (PORCINE) IN NACL 2-0.9 UNIT/ML-% IJ SOLN
INTRAMUSCULAR | Status: AC
  Filled 2017-01-19: qty 1000

## 2017-01-19 MED ORDER — MIDAZOLAM HCL 5 MG/5ML IJ SOLN
INTRAMUSCULAR | Status: AC
  Filled 2017-01-19: qty 5

## 2017-01-19 MED ORDER — LIDOCAINE HCL (PF) 1 % IJ SOLN
INTRAMUSCULAR | Status: AC
  Filled 2017-01-19: qty 30

## 2017-01-19 MED ORDER — PHENOL 1.4 % MT LIQD
1.0000 | OROMUCOSAL | Status: DC | PRN
  Filled 2017-01-19: qty 177

## 2017-01-19 MED ORDER — HYDRALAZINE HCL 20 MG/ML IJ SOLN: 5.0000 mg | INTRAMUSCULAR | Status: DC | PRN

## 2017-01-19 MED ORDER — SODIUM CHLORIDE 0.9 % IV SOLN: 500.0000 mL | Freq: Once | INTRAVENOUS | Status: DC | PRN

## 2017-01-19 SURGICAL SUPPLY — 25 items
BALLN ARMADA 9X20X135 (BALLOONS) ×3
BALLN LUTONIX DCB 5X40X130 (BALLOONS) ×3
BALLN LUTONIX DCB 7X60X130 (BALLOONS) ×3
BALLOON ARMADA 9X20X135 (BALLOONS) ×1 IMPLANT
BALLOON LUTONIX DCB 5X40X130 (BALLOONS) ×1 IMPLANT
BALLOON LUTONIX DCB 7X60X130 (BALLOONS) ×1 IMPLANT
CATH ANGIO 5F 100CM .035 PIG (CATHETERS) ×6 IMPLANT
CATH BEACON 5 .038 100 VERT TP (CATHETERS) ×3 IMPLANT
COVER PROBE U/S 5X48 (MISCELLANEOUS) ×3 IMPLANT
DEVICE PRESTO INFLATION (MISCELLANEOUS) ×3 IMPLANT
DEVICE STARCLOSE SE CLOSURE (Vascular Products) ×3 IMPLANT
DEVICE TORQUE (MISCELLANEOUS) ×3 IMPLANT
GLIDEWIRE ANGLED SS 035X260CM (WIRE) ×3 IMPLANT
NEEDLE ENTRY 21GA 7CM ECHOTIP (NEEDLE) ×3 IMPLANT
PACK ANGIOGRAPHY (CUSTOM PROCEDURE TRAY) ×3 IMPLANT
SHEATH BRITE TIP 5FRX11 (SHEATH) ×3 IMPLANT
SHEATH BRITE TIP 7FRX11 (SHEATH) ×3 IMPLANT
SHEATH DEST 7F 90CM MULTI- PUR (SHEATH) ×3 IMPLANT
SHEATH RAABE 7FR (SHEATH) ×3 IMPLANT
SHIELD RADPAD DADD DRAPE 4X9 (MISCELLANEOUS) ×3 IMPLANT
STENT LIFESTREAM 7X26X80 (Permanent Stent) ×3 IMPLANT
STENT LIFESTREAM 8X16X80 (Permanent Stent) ×3 IMPLANT
TUBING CONTRAST HIGH PRESS 72 (TUBING) ×3 IMPLANT
WIRE HI TORQ VERSACORE 300 (WIRE) ×3 IMPLANT
WIRE J 3MM .035X145CM (WIRE) ×3 IMPLANT

## 2017-01-19 NOTE — H&P (Signed)
[facility] History & Physical Update  The patient was interviewed and re-examined.  The patient's previous History and Physical has been reviewed and is unchanged.  There is no change in the plan of care. We plan to proceed with the scheduled procedure.  Hortencia Pilar, MD  01/19/2017, 9:38 AM

## 2017-01-19 NOTE — Op Note (Signed)
[facility] Percutaneous Study/Intervention Procedural Note   Date: 06/13/2016  Surgeon(s): Hortencia Pilar, MD  Assistants: none  Pre-operative Diagnosis: 1.  Subclavian Steal Syndrome 2. Left Subclavian stenosis; Left brachial artery stenosis 3.  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  End stage renal disease   Post-operative diagnosis: Same  Procedure(s) Performed: 1. Ultrasound guidance for vascular access right femoral artery  2. Catheter placement into left brachial artery from right femoral approach 3. Arch Aortogram and selective angiogram of the left arm third order catheter placement  4.  Percutaneous transluminal angioplasty of the left brachial artery with a 5 mm diameter x 40 mm length Lutonix angioplasty balloon 5. Stent to the left subclavian with 7 mm x 26 mm balloon expandable stent and an 8 mm x,16 mm Lifestream stents post dilation using an 9 mm balloon 6. Percutaneous transluminal angioplasty of the left brachiocephalic fistula, peripheral section, 27 mm with a Lutonix drug-eluting balloon              7.  StarClose closure device right femoral artery   Anesthesia: Conscious sedation was administered under my direct supervision by the interventional radiology RN.  IV Versed plus fentanyl were utilized. Continuous ECG, pulse oximetry and blood pressure was monitored throughout the entire procedure.  Versed and fentanyl were administered intravenously.  Conscious sedation was administered for a total of 57 minutes.   Contrast: 55  Fluoro time: 11.4   Indications: Patient is a 66 y.o. woman who has symptoms consistent with subclavian steal syndrome secondary to her dialysis access. The patient has a duplex ultrasound showing reversal of flow in the left vertebral artery consistent with subclavian steal. She has a large discrepancy in her blood  pressures between the right arm and the left arm with the left arm obviously being significantly lower. She also notes she has severe 10/10 hand pain. The patient is brought in for angiography for further evaluation and potential treatment. Risks and benefits are discussed and informed consent is obtained  Procedure: The patient was identified and appropriate procedural time out was performed. The patient was then placed supine on the table and prepped and draped in the usual sterile fashion. Ultrasound was used to evaluate the right common femoral artery. It was patent . A digital ultrasound image was acquired. A micropuncture needle was used to access the right common femoral artery under direct ultrasound guidance and a permanent image was performed. A microwire was then advanced easily under fluoroscopic guidance and a micro-sheath inserted. A 0.035 J wire was advanced without resistance and a 5Fr sheath was placed. Pigtail catheter was placed into the ascending aorta and an LAO projection of the arch was performed. This demonstrated a high-grade stenosis at the origin of the left subclavian. The innominate and left carotid were widely patent arch was otherwise normal.   The patient was given 5000 units of IV heparin.We upsized to a 7 Fr sheath Rabi Sheath.  A KMP catheter was used to selectively cannulate the subclavian and the catheter was advanced initially to the mid subclavian where hand injection contrast was used to demonstrate the distal subclavian and axillary arteries and verify intraluminal placement.  Magnified imaging at the origin of the subclavian through the sheath demonstrated a greater than 90% stenosis of the origin of the left subclavian.   Based on her symptoms and these findings, I elected to treat the left subclavian to improve the patient's clinical course and eliminate her steal syndrome. I  crossed the lesion without difficulty with a Glidewire and the KMP catheter and  exchanged the Glidewire for a versa core. Magnified imaging of the lesion were then obtained by hand injection through the sheath. I used a 7 mm diameter x 26 mm length Lifestream balloon expandable stent followed by an 8 mm x 16 m Lifestream balloon expandable stent. I inflated the balloon to 10 atm. I then advanced an 9 x 20 [place] balloon into the stent and inflated this to 8 atm for approximately 30 seconds to flare the ostial margin.    On completion angiogram following this, less than 10 % residual stenosis was identified.  I then exchanged the Rabi sheath for a 90 cm destination sheath and advanced the sheath wire combination into the mid brachial artery where magnified imaging of the distal brachial artery was performed demonstrating the stenosis just prior to the fistula in the brachial artery. The wires was then negotiated across the stenosis and position with the tip in the radial artery. A 5 mm x 40 mm Lutonix drug-eluting balloon was then advanced across the lesion inflated to 12 atm for 2 minutes. Follow-up imaging demonstrated less than 5% residual stenosis.  The Kumpe catheter was then advanced over the wire and the wire catheter negotiated into the fistula itself and then across the 2 stenotic lesions, a 70% stenosis and a 90% stenosis, previously identified. A 7 mm x 60 mm Lutonix drug-eluting balloon was then inflated to 12 atm for 2 minutes. Follow-up imaging demonstrated less than 20% residual stenosis in both of these lesions within the fistula.  Given that we have artery struggled with steal syndrome I do not believe that further increased balloon angioplasty is warranted at this time.   At this point, I elected to terminate the procedure. The diagnostic catheter was removed. StarClose closure device was deployed in usual fashion with excellent hemostatic result. The patient was taken to the recovery room in stable condition having tolerated the procedure  well.     Findings:Aortic arch demonstrates a type I arch. No evidence of ulcerations or other abnormalities of the arch. Great vessels are widely patent in their origins and visualized segments with the exception of the left subclavian which demonstrates a greater than 90% stenosis at its origin. The mid and distal subclavian and axillary brachial arteries and the trifurcation are all widely patent on the left.  Following angioplasty and stent placement there is now less than 10% residual stenosis. There is now forward flow noted in the left vertebral.  Within the brachial artery there is a 80% stenosis just proximal to the anastomosis. This is treated with a 5 mm balloon angioplasty using the Lutonix drug-eluting balloon and yielded a less than 5% residual stenosis.  Within the fistula proper there are 2 lesions previously identified a 70% stenosis and a 90% stenosis. These are treated with a single balloon inflation using a 7 mm x 60 mm Lutonix drug-eluting balloon with excellent result and less than 20% residual stenosis.  Disposition: Patient is status post successful intervention left subclavian. Patient was taken to the recovery room in stable condition having tolerated the procedure well.  Complications: None  Hortencia Pilar 06/13/2016 10:45 AM  This note was created with Dragon Medical transcription system. Any errors in dictation are purely unintentional.

## 2017-01-20 DIAGNOSIS — Z992 Dependence on renal dialysis: Secondary | ICD-10-CM | POA: Diagnosis not present

## 2017-01-20 DIAGNOSIS — N186 End stage renal disease: Secondary | ICD-10-CM | POA: Diagnosis not present

## 2017-01-21 DIAGNOSIS — Z992 Dependence on renal dialysis: Secondary | ICD-10-CM | POA: Diagnosis not present

## 2017-01-21 DIAGNOSIS — N186 End stage renal disease: Secondary | ICD-10-CM | POA: Diagnosis not present

## 2017-01-21 DIAGNOSIS — Z9289 Personal history of other medical treatment: Secondary | ICD-10-CM

## 2017-01-21 HISTORY — DX: Personal history of other medical treatment: Z92.89

## 2017-01-22 ENCOUNTER — Encounter: Payer: Self-pay | Admitting: Vascular Surgery

## 2017-01-22 DIAGNOSIS — I2510 Atherosclerotic heart disease of native coronary artery without angina pectoris: Secondary | ICD-10-CM | POA: Diagnosis not present

## 2017-01-22 DIAGNOSIS — E11649 Type 2 diabetes mellitus with hypoglycemia without coma: Secondary | ICD-10-CM | POA: Diagnosis not present

## 2017-01-22 DIAGNOSIS — E162 Hypoglycemia, unspecified: Secondary | ICD-10-CM | POA: Diagnosis not present

## 2017-01-22 DIAGNOSIS — Z7982 Long term (current) use of aspirin: Secondary | ICD-10-CM | POA: Diagnosis not present

## 2017-01-22 DIAGNOSIS — I509 Heart failure, unspecified: Secondary | ICD-10-CM | POA: Diagnosis not present

## 2017-01-22 DIAGNOSIS — E785 Hyperlipidemia, unspecified: Secondary | ICD-10-CM | POA: Diagnosis not present

## 2017-01-22 DIAGNOSIS — N186 End stage renal disease: Secondary | ICD-10-CM | POA: Diagnosis not present

## 2017-01-22 DIAGNOSIS — G4489 Other headache syndrome: Secondary | ICD-10-CM | POA: Diagnosis not present

## 2017-01-22 DIAGNOSIS — Z992 Dependence on renal dialysis: Secondary | ICD-10-CM | POA: Diagnosis not present

## 2017-01-22 DIAGNOSIS — I110 Hypertensive heart disease with heart failure: Secondary | ICD-10-CM | POA: Diagnosis not present

## 2017-01-22 DIAGNOSIS — S0990XA Unspecified injury of head, initial encounter: Secondary | ICD-10-CM | POA: Diagnosis not present

## 2017-01-22 DIAGNOSIS — R404 Transient alteration of awareness: Secondary | ICD-10-CM | POA: Diagnosis not present

## 2017-01-22 DIAGNOSIS — R55 Syncope and collapse: Secondary | ICD-10-CM | POA: Diagnosis not present

## 2017-01-22 DIAGNOSIS — Z794 Long term (current) use of insulin: Secondary | ICD-10-CM | POA: Diagnosis not present

## 2017-01-23 DIAGNOSIS — N186 End stage renal disease: Secondary | ICD-10-CM | POA: Diagnosis not present

## 2017-01-23 DIAGNOSIS — Z992 Dependence on renal dialysis: Secondary | ICD-10-CM | POA: Diagnosis not present

## 2017-01-24 DIAGNOSIS — Z992 Dependence on renal dialysis: Secondary | ICD-10-CM | POA: Diagnosis not present

## 2017-01-24 DIAGNOSIS — N186 End stage renal disease: Secondary | ICD-10-CM | POA: Diagnosis not present

## 2017-01-25 ENCOUNTER — Telehealth (INDEPENDENT_AMBULATORY_CARE_PROVIDER_SITE_OTHER): Payer: Self-pay

## 2017-01-25 DIAGNOSIS — N186 End stage renal disease: Secondary | ICD-10-CM | POA: Diagnosis not present

## 2017-01-25 DIAGNOSIS — Z992 Dependence on renal dialysis: Secondary | ICD-10-CM | POA: Diagnosis not present

## 2017-01-25 NOTE — Telephone Encounter (Signed)
Patient's husband called stating the patients arm and hand are the same as before her angio which was on 01/19/17, he also states she is having headaches that have gotten progressively worse and that these were coming form the angio she had. He stated  That [place] she went to the ED at [facility] and was told nothing was wrong with her head. Per Dr. Delana Meyer the the patient should be seen in the ED for evaluation of her headaches because an angio would not cause headaches and her arm we will schedule her for a steal syndrome study in our office.

## 2017-01-26 DIAGNOSIS — Z992 Dependence on renal dialysis: Secondary | ICD-10-CM | POA: Diagnosis not present

## 2017-01-26 DIAGNOSIS — N186 End stage renal disease: Secondary | ICD-10-CM | POA: Diagnosis not present

## 2017-01-27 DIAGNOSIS — N186 End stage renal disease: Secondary | ICD-10-CM | POA: Diagnosis not present

## 2017-01-27 DIAGNOSIS — Z992 Dependence on renal dialysis: Secondary | ICD-10-CM | POA: Diagnosis not present

## 2017-01-28 DIAGNOSIS — N186 End stage renal disease: Secondary | ICD-10-CM | POA: Diagnosis not present

## 2017-01-28 DIAGNOSIS — Z992 Dependence on renal dialysis: Secondary | ICD-10-CM | POA: Diagnosis not present

## 2017-01-29 ENCOUNTER — Encounter: Payer: Self-pay | Admitting: Emergency Medicine

## 2017-01-29 ENCOUNTER — Inpatient Hospital Stay
Admission: EM | Admit: 2017-01-29 | Discharge: 2017-01-31 | DRG: 377 | Disposition: A | Discharge status: Home / Self Care | Payer: Medicare HMO | Attending: Internal Medicine | Admitting: Internal Medicine

## 2017-01-29 ENCOUNTER — Other Ambulatory Visit
Admission: RE | Admit: 2017-01-29 | Discharge: 2017-01-29 | Disposition: A | Discharge status: Home / Self Care | Payer: Medicare HMO | Source: Other Acute Inpatient Hospital | Attending: Internal Medicine | Admitting: Internal Medicine

## 2017-01-29 DIAGNOSIS — D500 Iron deficiency anemia secondary to blood loss (chronic): Secondary | ICD-10-CM | POA: Diagnosis present

## 2017-01-29 DIAGNOSIS — Z79899 Other long term (current) drug therapy: Secondary | ICD-10-CM

## 2017-01-29 DIAGNOSIS — E113511 Type 2 diabetes mellitus with proliferative diabetic retinopathy with macular edema, right eye: Secondary | ICD-10-CM | POA: Diagnosis not present

## 2017-01-29 DIAGNOSIS — I509 Heart failure, unspecified: Secondary | ICD-10-CM | POA: Diagnosis present

## 2017-01-29 DIAGNOSIS — R110 Nausea: Secondary | ICD-10-CM | POA: Diagnosis not present

## 2017-01-29 DIAGNOSIS — Z9841 Cataract extraction status, right eye: Secondary | ICD-10-CM

## 2017-01-29 DIAGNOSIS — D649 Anemia, unspecified: Secondary | ICD-10-CM

## 2017-01-29 DIAGNOSIS — Z7982 Long term (current) use of aspirin: Secondary | ICD-10-CM

## 2017-01-29 DIAGNOSIS — N186 End stage renal disease: Secondary | ICD-10-CM | POA: Diagnosis not present

## 2017-01-29 DIAGNOSIS — I2510 Atherosclerotic heart disease of native coronary artery without angina pectoris: Secondary | ICD-10-CM | POA: Diagnosis present

## 2017-01-29 DIAGNOSIS — Z880 Allergy status to penicillin: Secondary | ICD-10-CM | POA: Diagnosis not present

## 2017-01-29 DIAGNOSIS — I10 Essential (primary) hypertension: Secondary | ICD-10-CM | POA: Diagnosis not present

## 2017-01-29 DIAGNOSIS — D638 Anemia in other chronic diseases classified elsewhere: Secondary | ICD-10-CM | POA: Diagnosis not present

## 2017-01-29 DIAGNOSIS — E785 Hyperlipidemia, unspecified: Secondary | ICD-10-CM | POA: Diagnosis not present

## 2017-01-29 DIAGNOSIS — E039 Hypothyroidism, unspecified: Secondary | ICD-10-CM | POA: Diagnosis not present

## 2017-01-29 DIAGNOSIS — R42 Dizziness and giddiness: Secondary | ICD-10-CM | POA: Insufficient documentation

## 2017-01-29 DIAGNOSIS — Z961 Presence of intraocular lens: Secondary | ICD-10-CM | POA: Diagnosis present

## 2017-01-29 DIAGNOSIS — Z87891 Personal history of nicotine dependence: Secondary | ICD-10-CM

## 2017-01-29 DIAGNOSIS — D631 Anemia in chronic kidney disease: Secondary | ICD-10-CM | POA: Diagnosis present

## 2017-01-29 DIAGNOSIS — K922 Gastrointestinal hemorrhage, unspecified: Secondary | ICD-10-CM | POA: Diagnosis not present

## 2017-01-29 DIAGNOSIS — Z7902 Long term (current) use of antithrombotics/antiplatelets: Secondary | ICD-10-CM | POA: Diagnosis not present

## 2017-01-29 DIAGNOSIS — Z9861 Coronary angioplasty status: Secondary | ICD-10-CM

## 2017-01-29 DIAGNOSIS — H9190 Unspecified hearing loss, unspecified ear: Secondary | ICD-10-CM | POA: Diagnosis present

## 2017-01-29 DIAGNOSIS — I132 Hypertensive heart and chronic kidney disease with heart failure and with stage 5 chronic kidney disease, or end stage renal disease: Secondary | ICD-10-CM | POA: Diagnosis present

## 2017-01-29 DIAGNOSIS — Z881 Allergy status to other antibiotic agents status: Secondary | ICD-10-CM | POA: Diagnosis not present

## 2017-01-29 DIAGNOSIS — D509 Iron deficiency anemia, unspecified: Secondary | ICD-10-CM | POA: Diagnosis not present

## 2017-01-29 DIAGNOSIS — Z992 Dependence on renal dialysis: Secondary | ICD-10-CM | POA: Diagnosis not present

## 2017-01-29 DIAGNOSIS — N2581 Secondary hyperparathyroidism of renal origin: Secondary | ICD-10-CM | POA: Diagnosis present

## 2017-01-29 DIAGNOSIS — Z90710 Acquired absence of both cervix and uterus: Secondary | ICD-10-CM | POA: Diagnosis not present

## 2017-01-29 DIAGNOSIS — R531 Weakness: Secondary | ICD-10-CM | POA: Diagnosis not present

## 2017-01-29 DIAGNOSIS — Z794 Long term (current) use of insulin: Secondary | ICD-10-CM

## 2017-01-29 DIAGNOSIS — I771 Stricture of artery: Secondary | ICD-10-CM | POA: Insufficient documentation

## 2017-01-29 DIAGNOSIS — E1122 Type 2 diabetes mellitus with diabetic chronic kidney disease: Secondary | ICD-10-CM | POA: Diagnosis not present

## 2017-01-29 LAB — CBC
HCT: 18.5 % — ABNORMAL LOW (ref 35.0–47.0)
HCT: 18.2 % — ABNORMAL LOW (ref 35.0–47.0)
Hemoglobin: 6.3 g/dL — ABNORMAL LOW (ref 12.0–16.0)
Hemoglobin: 6.2 g/dL — ABNORMAL LOW (ref 12.0–16.0)
MCH: 31.6 pg (ref 26.0–34.0)
MCH: 32.1 pg (ref 26.0–34.0)
MCHC: 33.9 g/dL (ref 32.0–36.0)
MCHC: 34.2 g/dL (ref 32.0–36.0)
MCV: 93.4 fL (ref 80.0–100.0)
MCV: 93.7 fL (ref 80.0–100.0)
PLATELETS: 185 10*3/uL (ref 150–440)
PLATELETS: 168 10*3/uL (ref 150–440)
RBC: 1.98 MIL/uL — ABNORMAL LOW (ref 3.80–5.20)
RBC: 1.94 MIL/uL — AB (ref 3.80–5.20)
RDW: 18.5 % — ABNORMAL HIGH (ref 11.5–14.5)
RDW: 17.4 % — AB (ref 11.5–14.5)
WBC: 6.9 10*3/uL (ref 3.6–11.0)
WBC: 6.6 10*3/uL (ref 3.6–11.0)

## 2017-01-29 LAB — COMPREHENSIVE METABOLIC PANEL
ALBUMIN: 2.8 g/dL — AB (ref 3.5–5.0)
ALK PHOS: 43 U/L (ref 38–126)
ALT: 14 U/L (ref 14–54)
AST: 14 U/L — ABNORMAL LOW (ref 15–41)
Anion gap: 17 — ABNORMAL HIGH (ref 5–15)
BILIRUBIN TOTAL: 0.7 mg/dL (ref 0.3–1.2)
BUN: 93 mg/dL — ABNORMAL HIGH (ref 6–20)
CALCIUM: 8.2 mg/dL — AB (ref 8.9–10.3)
CO2: 23 mmol/L (ref 22–32)
CREATININE: 14.64 mg/dL — AB (ref 0.44–1.00)
Chloride: 97 mmol/L — ABNORMAL LOW (ref 101–111)
GFR calc non Af Amer: 2 mL/min — ABNORMAL LOW (ref >60)
GFR, EST AFRICAN AMERICAN: 3 mL/min — AB (ref >60)
GLUCOSE: 136 mg/dL — AB (ref 65–99)
Potassium: 4.8 mmol/L (ref 3.5–5.1)
SODIUM: 137 mmol/L (ref 135–145)
TOTAL PROTEIN: 5.8 g/dL — AB (ref 6.5–8.1)

## 2017-01-29 LAB — RETICULOCYTES
RBC.: 1.9 MIL/uL — ABNORMAL LOW (ref 3.80–5.20)
Retic Count, Absolute: 47.5 10*3/uL (ref 19.0–183.0)
Retic Ct Pct: 2.5 % (ref 0.4–3.1)

## 2017-01-29 LAB — TROPONIN I: Troponin I: <0.03 ng/mL (ref <0.03)

## 2017-01-29 LAB — PREPARE RBC (CROSSMATCH)

## 2017-01-29 LAB — GLUCOSE, CAPILLARY: GLUCOSE-CAPILLARY: 107 mg/dL — AB (ref 65–99)

## 2017-01-29 MED ORDER — SODIUM CHLORIDE 0.9 % IV SOLN: 10.0000 mL/h | Freq: Once | INTRAVENOUS | Status: AC

## 2017-01-29 MED ORDER — SODIUM BICARBONATE 650 MG PO TABS
1300.0000 mg | ORAL_TABLET | Freq: Two times a day (BID) | ORAL | Status: DC
  Administered 2017-01-29 – 2017-01-31 (×3): 1300 mg via ORAL
  Filled 2017-01-29 (×4): qty 2

## 2017-01-29 MED ORDER — DARBEPOETIN ALFA 40 MCG/0.4ML IJ SOSY
40.0000 ug | PREFILLED_SYRINGE | Freq: Once | INTRAMUSCULAR | Status: AC
  Administered 2017-01-29: 40 ug via SUBCUTANEOUS
  Filled 2017-01-29: qty 0.4

## 2017-01-29 MED ORDER — RENA-VITE PO TABS
1.0000 | ORAL_TABLET | Freq: Every day | ORAL | Status: DC
Start: 2017-01-30 — End: 2017-01-31
  Administered 2017-01-30 – 2017-01-31 (×2): 1 via ORAL
  Filled 2017-01-29 (×2): qty 1

## 2017-01-29 MED ORDER — OXYCODONE HCL 5 MG PO TABS: 5.0000 mg | ORAL_TABLET | ORAL | Status: DC | PRN

## 2017-01-29 MED ORDER — SODIUM CHLORIDE 0.9 % IV SOLN
INTRAVENOUS | Status: DC
  Administered 2017-01-30: 06:00:00 via INTRAVENOUS

## 2017-01-29 MED ORDER — AMLODIPINE BESYLATE 5 MG PO TABS
5.0000 mg | ORAL_TABLET | Freq: Every day | ORAL | Status: DC
  Administered 2017-01-30 – 2017-01-31 (×2): 5 mg via ORAL
  Filled 2017-01-29 (×2): qty 1

## 2017-01-29 MED ORDER — PANTOPRAZOLE SODIUM 40 MG IV SOLR
40.0000 mg | Freq: Two times a day (BID) | INTRAVENOUS | Status: DC
  Administered 2017-01-29 – 2017-01-31 (×4): 40 mg via INTRAVENOUS
  Filled 2017-01-29 (×4): qty 40

## 2017-01-29 MED ORDER — CLOPIDOGREL BISULFATE 75 MG PO TABS
75.0000 mg | ORAL_TABLET | Freq: Every day | ORAL | Status: DC
  Administered 2017-01-30: 75 mg via ORAL
  Filled 2017-01-29: qty 1

## 2017-01-29 MED ORDER — METOLAZONE 2.5 MG PO TABS
2.5000 mg | ORAL_TABLET | Freq: Every day | ORAL | Status: DC
  Administered 2017-01-30 – 2017-01-31 (×2): 2.5 mg via ORAL
  Filled 2017-01-29 (×2): qty 1

## 2017-01-29 MED ORDER — ACETAMINOPHEN 325 MG PO TABS: 650.0000 mg | ORAL_TABLET | Freq: Four times a day (QID) | ORAL | Status: DC | PRN

## 2017-01-29 MED ORDER — MAGNESIUM CITRATE PO SOLN
1.0000 | Freq: Once | ORAL | Status: DC | PRN
  Filled 2017-01-29: qty 296

## 2017-01-29 MED ORDER — ONDANSETRON HCL 4 MG PO TABS
4.0000 mg | ORAL_TABLET | Freq: Four times a day (QID) | ORAL | Status: DC | PRN
  Administered 2017-01-31: 4 mg via ORAL
  Filled 2017-01-29: qty 1

## 2017-01-29 MED ORDER — SODIUM CHLORIDE 0.9 % IV SOLN
80.0000 mg | Freq: Once | INTRAVENOUS | Status: AC
  Administered 2017-01-30: 06:00:00 80 mg via INTRAVENOUS
  Filled 2017-01-29 (×2): qty 80

## 2017-01-29 MED ORDER — GENTAMICIN SULFATE 0.1 % EX CREA
1.0000 "application " | TOPICAL_CREAM | Freq: Every day | CUTANEOUS | Status: DC
  Filled 2017-01-29: qty 15

## 2017-01-29 MED ORDER — ONDANSETRON HCL 4 MG/2ML IJ SOLN: 4.0000 mg | Freq: Four times a day (QID) | INTRAMUSCULAR | Status: DC | PRN

## 2017-01-29 MED ORDER — LEVOTHYROXINE SODIUM 50 MCG PO TABS
75.0000 ug | ORAL_TABLET | Freq: Every day | ORAL | Status: DC
  Administered 2017-01-30 – 2017-01-31 (×2): 75 ug via ORAL
  Filled 2017-01-29 (×2): qty 2

## 2017-01-29 MED ORDER — ACETAMINOPHEN 650 MG RE SUPP: 650.0000 mg | Freq: Four times a day (QID) | RECTAL | Status: DC | PRN

## 2017-01-29 MED ORDER — SENNOSIDES-DOCUSATE SODIUM 8.6-50 MG PO TABS: 1.0000 | ORAL_TABLET | Freq: Every evening | ORAL | Status: DC | PRN

## 2017-01-29 MED ORDER — FUROSEMIDE 40 MG PO TABS
80.0000 mg | ORAL_TABLET | Freq: Two times a day (BID) | ORAL | Status: DC
  Administered 2017-01-30 – 2017-01-31 (×2): 80 mg via ORAL
  Filled 2017-01-29 (×2): qty 2

## 2017-01-29 MED ORDER — SODIUM CHLORIDE 0.9 % IV SOLN: 80.0000 mg | Freq: Once | INTRAVENOUS | Status: DC

## 2017-01-29 MED ORDER — BISACODYL 5 MG PO TBEC: 5.0000 mg | DELAYED_RELEASE_TABLET | Freq: Every day | ORAL | Status: DC | PRN

## 2017-01-29 MED ORDER — IPRATROPIUM BROMIDE 0.02 % IN SOLN: 0.5000 mg | Freq: Four times a day (QID) | RESPIRATORY_TRACT | Status: DC | PRN

## 2017-01-29 MED ORDER — ALBUTEROL SULFATE (2.5 MG/3ML) 0.083% IN NEBU: 2.5000 mg | INHALATION_SOLUTION | Freq: Four times a day (QID) | RESPIRATORY_TRACT | Status: DC | PRN

## 2017-01-29 MED ORDER — CARVEDILOL 25 MG PO TABS
25.0000 mg | ORAL_TABLET | Freq: Two times a day (BID) | ORAL | Status: DC
  Administered 2017-01-30 – 2017-01-31 (×2): 25 mg via ORAL
  Filled 2017-01-29 (×2): qty 1

## 2017-01-29 MED ORDER — CALCIUM ACETATE (PHOS BINDER) 667 MG PO CAPS
2001.0000 mg | ORAL_CAPSULE | Freq: Three times a day (TID) | ORAL | Status: DC
  Administered 2017-01-30 – 2017-01-31 (×2): 2001 mg via ORAL
  Filled 2017-01-29 (×3): qty 3

## 2017-01-29 MED ORDER — INSULIN GLARGINE 100 UNIT/ML ~~LOC~~ SOLN
12.0000 [IU] | Freq: Every day | SUBCUTANEOUS | Status: DC
  Filled 2017-01-29 (×3): qty 0.12

## 2017-01-29 MED ORDER — LISINOPRIL 10 MG PO TABS
5.0000 mg | ORAL_TABLET | Freq: Every day | ORAL | Status: DC
  Administered 2017-01-30: 5 mg via ORAL
  Filled 2017-01-29 (×2): qty 1

## 2017-01-29 MED ORDER — VITAMIN B-12 1000 MCG PO TABS
1000.0000 ug | ORAL_TABLET | Freq: Every day | ORAL | Status: DC
  Administered 2017-01-30 – 2017-01-31 (×2): 1000 ug via ORAL
  Filled 2017-01-29 (×2): qty 1

## 2017-01-29 MED ORDER — ASPIRIN EC 81 MG PO TBEC
81.0000 mg | DELAYED_RELEASE_TABLET | Freq: Every day | ORAL | Status: DC
  Administered 2017-01-30: 81 mg via ORAL
  Filled 2017-01-29: qty 1

## 2017-01-29 MED ORDER — HYDRALAZINE HCL 50 MG PO TABS
50.0000 mg | ORAL_TABLET | Freq: Two times a day (BID) | ORAL | Status: DC
  Administered 2017-01-31: 50 mg via ORAL
  Filled 2017-01-29: qty 1

## 2017-01-29 NOTE — ED Provider Notes (Signed)
[facility] Emergency Department Provider Note  ____________________________________________   First MD Initiated Contact with Patient 01/29/17 1909     (approximate)  I have reviewed the triage vital signs and the nursing notes.   HISTORY  Chief Complaint Anemia   HPI Bethany Lee is a 66 y.o. female who is sent to the emergency department by her primary care physician for symptomatic anemia. She has a complex past medical history including chronic anemia, chronic kidney disease for which she receives his peritoneal dialysis daily, coronary artery disease, and diabetes mellitus. She has also recently had subclavian steal syndrome when she had a fistula placed in her left upper extremity for emergency dialysis access. She reports progressive fatigue for the past week or so. She normally receives erythropoietin twice a month or dialysis Center but she missed her last doses she was on vacation in [place]. While at [facility] she was noted to have a headache which she normally does not have and she went to a local emergency department where she reportedly had a negative head CT and a hemoglobin that was 8. She denies change in her stools. Her symptoms have been gradual progressive. Nothing seems to make him better. They're worse with exertion.   Past Medical History:  Diagnosis Date  . Anemia   . CHF (congestive heart failure) ([place])   . Chronic kidney disease    peritoneal dialysis  . Complication of anesthesia   . Coronary artery disease   . Diabetes mellitus without complication ([place])   . Dialysis patient [facility])    Peritoneal dialysis patient  . Dyspnea    doe  . Edema   . Hypercholesterolemia   . Hypertension   . Hypothyroidism   . Neuropathy   . PONV (postoperative nausea and vomiting)   . Shingles    October-November 2017  . Tremors of nervous system    intermittent when taking gabapentin    Patient Active Problem List   Diagnosis  Date Noted  . Acute respiratory distress   . Acute respiratory failure ([place]) 09/21/2016  . CAD (coronary artery disease) 09/07/2016  . Acute pulmonary edema ([place]) 07/16/2016  . ESRD on dialysis ([place]) 12/21/2015  . Peritoneal dialysis status ([place]) 12/21/2015  . Leg swelling 12/21/2015  . Anemia of chronic disease 12/21/2015  . Diabetes mellitus ([place]) 12/21/2015  . Hyperkalemia 12/21/2015  . 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 Anemia in chronic renal disease 10/04/2015  . Proteinuria 05/25/2015    Past Surgical History:  Procedure Laterality Date  . A/V SHUNT INTERVENTION N/A 01/16/2017   Procedure: A/V Shunt Intervention;  Surgeon: Katha Cabal, MD;  Location: [place] [facility];  Service: Cardiovascular;  Laterality: N/A;  . A/V SHUNTOGRAM Left 01/16/2017   Procedure: A/V Fistulagram;  Surgeon: Katha Cabal, MD;  Location: [place] [facility];  Service: Cardiovascular;  Laterality: Left;  . ABDOMINAL HYSTERECTOMY    . AV FISTULA PLACEMENT Left 11/30/2016   Procedure: ARTERIOVENOUS (AV) FISTULA CREATION ( EXPLORE FOR CREATION BRACHIOCEPHALIC);  Surgeon: Algernon Huxley, MD;  Location: [facility];  Service: Vascular;  Laterality: Left;  . CAPD INSERTION N/A 11/25/2015   Procedure: LAPAROSCOPIC INSERTION CONTINUOUS AMBULATORY PERITONEAL DIALYSIS  (CAPD) CATHETER;  Surgeon: Algernon Huxley, MD;  Location: [facility];  Service: Vascular;  Laterality: N/A;  . CAPD INSERTION N/A 01/06/2016   Procedure: LAPAROSCOPIC INSERTION CONTINUOUS AMBULATORY PERITONEAL DIALYSIS  (CAPD) CATHETER REVISION ;  Surgeon: Algernon Huxley, MD;  Location: [facility];  Service: Vascular;  Laterality: N/A;  . CAPD INSERTION N/A 10/11/2016   Procedure: LAPAROSCOPIC INSERTION CONTINUOUS AMBULATORY PERITONEAL DIALYSIS  (CAPD) CATHETER ( REVISION );  Surgeon: Algernon Huxley, MD;  Location: [facility];  Service: Vascular;  Laterality: N/A;  . CARDIAC CATHETERIZATION    . CATARACT EXTRACTION W/PHACO Right 05/25/2016   Procedure: CATARACT  EXTRACTION PHACO AND INTRAOCULAR LENS PLACEMENT (IOC);  Surgeon: Eulogio Bear, MD;  Location: [facility] OR[facility];  Service: Ophthalmology;  Laterality: Right;  Lot # [id number] H [place]: 00:5.2 AP%: 11.3 CDE: 7.21  . CORONARY ANGIOPLASTY    . CORONARY STENT INTERVENTION N/A 09/07/2016   Procedure: Coronary Stent Intervention;  Surgeon: Isaias Cowman, MD;  Location: [place] [facility];  Service: Cardiovascular;  Laterality: N/A;  . DIALYSIS/PERMA CATHETER INSERTION N/A 09/21/2016   Procedure: Dialysis/Perma Catheter Insertion;  Surgeon: Algernon Huxley, MD;  Location: [place] [facility];  Service: Cardiovascular;  Laterality: N/A;  . DIALYSIS/PERMA CATHETER REMOVAL Right 11/30/2016   Procedure: DIALYSIS/PERMA CATHETER REMOVAL;  Surgeon: Algernon Huxley, MD;  Location: [facility];  Service: Vascular;  Laterality: Right;  . EYE SURGERY    . LEFT HEART CATH AND CORONARY ANGIOGRAPHY Left 09/07/2016   Procedure: Left Heart Cath and Coronary Angiography;  Surgeon: Isaias Cowman, MD;  Location: [place] [facility];  Service: Cardiovascular;  Laterality: Left;  . TUBAL LIGATION    . UPPER EXTREMITY ANGIOGRAPHY Left 01/19/2017   Procedure: Upper Extremity Angiography;  Surgeon: Katha Cabal, MD;  Location: [place] [facility];  Service: Cardiovascular;  Laterality: Left;    Prior to Admission medications   Medication Sig Start Date End Date Taking? Authorizing Provider  amLODipine (NORVASC) 5 MG tablet Take 5 mg by mouth daily.    [provider]  aspirin EC 81 MG tablet Take 1 tablet (81 mg total) by mouth daily. Start taking after NOV 15 Patient taking differently: Take 81 mg by mouth daily.  05/26/15   Murlean Iba, MD  calcium carbonate (TUMS - DOSED IN MG ELEMENTAL CALCIUM) 500 MG chewable tablet Chew 2 tablets by mouth daily.    [provider]  carvedilol (COREG) 12.5 MG tablet Take 1 tablet (12.5 mg total) by mouth 2 (two) times daily with a meal. 07/20/16   Fritzi Mandes, MD    clopidogrel (PLAVIX) 75 MG tablet Take 1 tablet (75 mg total) by mouth daily with breakfast. 09/08/16   Paraschos, Sheppard Coil, MD  Epoetin Alfa (EPOGEN IJ) Inject 1 Syringe as directed every 14 (fourteen) days.     [provider]  furosemide (LASIX) 80 MG tablet Take 80 mg by mouth 2 (two) times daily.    [provider]  gentamicin cream (GARAMYCIN) 0.1 % Apply 1 application topically daily. Patient taking differently: Apply 1 application topically daily. Catheter dressing changes 12/21/15   Theodoro Grist, MD  hydrALAZINE (APRESOLINE) 50 MG tablet Take 50 mg by mouth 2 (two) times daily.    [provider]  HYDROcodone-acetaminophen (NORCO) 5-325 MG tablet Take 1 tablet by mouth every 6 (six) hours as needed for moderate pain. Patient not taking: Reported on 01/05/2017 11/30/16   Algernon Huxley, MD  insulin aspart (NOVOLOG FLEXPEN) 100 UNIT/ML FlexPen Inject 4-8 Units into the skin 3 (three) times daily with meals. Sliding scale below 200 take 4 units, 200-250=6 units, >250= 8 units 09/13/15   [provider]  Insulin Glargine (TOUJEO SOLOSTAR) 300 UNIT/ML SOPN Inject 20 Units into the skin at bedtime. Patient taking differently: Inject  24 Units into the skin at bedtime.  12/21/15   Theodoro Grist, MD  levothyroxine (SYNTHROID, LEVOTHROID) 75 MCG tablet Take 75 mcg by mouth daily before breakfast.    [provider]  lovastatin (MEVACOR) 40 MG tablet Take 40 mg by mouth daily with supper.     [provider]  metolazone (ZAROXOLYN) 5 MG tablet Take 5 mg by mouth daily.    [provider]  Multiple Vitamins-Minerals (DIALYVITE 800/ULTRA D) TABS Take 1 tablet by mouth daily.    [provider]  Phenyleph-Doxylamine-DM-APAP (NYQUIL SEVERE COLD/FLU) 5-6.25-10-325 MG/15ML LIQD Take 30 mLs by mouth at bedtime as needed (for cough).    [provider]  sevelamer carbonate (RENVELA) 800 MG tablet Take 2,400 mg by mouth 3 (three)  times daily with meals.    [provider]  vitamin B-12 (CYANOCOBALAMIN) 1000 MCG tablet Take 1,000 mcg by mouth daily.    [provider]    Allergies Penicillins and Valacyclovir  No family history on file.  Social History Social History  Substance Use Topics  . Smoking status: Former Smoker    Quit date: 10/09/2008  . Smokeless tobacco: Never Used  . Alcohol use No    Review of Systems Constitutional: No fever/chills Eyes: No visual changes. ENT: No sore throat. Cardiovascular: Denies chest pain. Respiratory: Denies shortness of breath. Gastrointestinal: No abdominal pain.  No nausea, no vomiting.  No diarrhea.  No constipation. Genitourinary: Negative for dysuria. Musculoskeletal: Negative for back pain. Skin: Negative for rash. Neurological:Positive for headache   ____________________________________________   PHYSICAL EXAM:  VITAL SIGNS: ED Triage Vitals  Enc Vitals Group     BP 01/29/17 1735 (!) 125/47     Pulse Rate 01/29/17 1735 82     Resp 01/29/17 1735 16     Temp 01/29/17 1735 98.3 F (36.8 C)     Temp Source 01/29/17 1735 Oral     SpO2 01/29/17 1735 100 %     Weight 01/29/17 1733 140 lb (63.5 kg)     Height 01/29/17 1733 5' (1.524 m)     Head Circumference --      Peak Flow --      Pain Score --      Pain Loc --      Pain Edu? --      Excl. in [place]? --     Constitutional: Alert and oriented 4 cell O skin appears tired Eyes: PERRL EOMI. significant conjunctival pallor Head: Atraumatic. Nose: No congestion/rhinnorhea. Mouth/Throat: No trismus Neck: No stridor.   Cardiovascular: Normal rate, regular rhythm. Grossly normal heart sounds.  Good peripheral circulation. Respiratory: Normal respiratory effort.  No retractions. Lungs CTAB and moving good air Gastrointestinal: Soft nontender peritoneal dialysis catheter in place with no signs of infection. Rectal exam performed with female nurse chaperone Jenna: Guaiac positive  control positive brown stool Musculoskeletal: No lower extremity edema   Neurologic:  Normal speech and language. No gross focal neurologic deficits are appreciated. Skin:  Skin is warm, dry and intact. No rash noted. Psychiatric: Mood and affect are normal. Speech and behavior are normal.    ____________________________________________   DIFFERENTIAL includes but not limited to  Anemia of chronic disease, GI bleed, hemolytic anemia ____________________________________________   LABS (all labs ordered are listed, but only abnormal results are displayed)  Labs Reviewed  COMPREHENSIVE METABOLIC PANEL - Abnormal; Notable for the following:       Result Value   Chloride 97 (*)    Glucose,  Bld 136 (*)    BUN 93 (*)    Creatinine, Ser 14.64 (*)    Calcium 8.2 (*)    Total Protein 5.8 (*)    Albumin 2.8 (*)    AST 14 (*)    GFR calc non Af Amer 2 (*)    GFR calc Af Amer 3 (*)    Anion gap 17 (*)    All other components within normal limits  CBC - Abnormal; Notable for the following:    RBC 1.98 (*)    Hemoglobin 6.3 (*)    HCT 18.5 (*)    RDW 18.5 (*)    All other components within normal limits  VITAMIN B12  FOLATE  IRON AND TIBC  FERRITIN  RETICULOCYTES  POC OCCULT BLOOD, ED  TYPE AND SCREEN  PREPARE RBC (CROSSMATCH)    Hemoglobin of 6.3 down from last of 10 in our hospital about a month ago. Normal MCV the elevated RDW concerning for iron deficiency __________________________________________  EKG   ____________________________________________  RADIOLOGY   ____________________________________________   PROCEDURES  Procedure(s) performed: no  Procedures  Critical Care performed: yes  CRITICAL CARE Performed by: Darel Hong   Total critical care time: 35 minutes  Critical care time was exclusive of separately billable procedures and treating other patients.  Critical care was necessary to treat or prevent imminent or life-threatening  deterioration.  Critical care was time spent personally by me on the following activities: development of treatment plan with patient and/or surrogate as well as nursing, discussions with consultants, evaluation of patient's response to treatment, examination of patient, obtaining history from patient or surrogate, ordering and performing treatments and interventions, ordering and review of laboratory studies, ordering and review of radiographic studies, pulse oximetry and re-evaluation of patient's condition.   Observation: no ____________________________________________   INITIAL IMPRESSION / ASSESSMENT AND PLAN / ED COURSE  Pertinent labs & imaging results that were available during my care of the patient were reviewed by me and considered in my medical decision making (see chart for details).  The patient arrives with significant orthostasis and fatigue along with a hemoglobin of 6.3. There were 2 things going on and she certainly does have anemia of chronic disease but she does now have a new GI bleed. As this is an acute drop believe she warrants inpatient admission for emergent blood transfusion.     ----------------------------------------- 7:37 PM on 01/29/2017 -----------------------------------------  The patient is guaiac positive control positive brown stool. Her hemoglobin today is 6.3 down from 8 several days ago in [place] and down from 10 month ago at our hospital. At this point I believe she requires inpatient admission for emergent blood transfusion as well as PPI and possible GI consult. ____________________________________________   FINAL CLINICAL IMPRESSION(S) / ED DIAGNOSES  Final diagnoses:  Symptomatic anemia  Gastrointestinal hemorrhage, unspecified gastrointestinal hemorrhage type      NEW MEDICATIONS STARTED DURING THIS VISIT:  New Prescriptions   No medications on file     Note:  This document was prepared using Dragon voice recognition  software and may include unintentional dictation errors.     Darel Hong, MD 01/29/17 1946

## 2017-01-29 NOTE — ED Notes (Signed)
Apolonio Schneiders RN made 2 unsuccessful attempts at IV insertion

## 2017-01-29 NOTE — ED Notes (Addendum)
Pt reports having a stent placed in left arm.  Pt reports headache beginning last Saturday and ending Wednesday/Thursday.  Pt c/o intermittent dizziness, blurred vision, and weakness.    Pt reports she went to primary care today and received call back to go to ED due to low hemoglobin.

## 2017-01-29 NOTE — ED Notes (Signed)
Patient reports her hemoglobin was 8.4 on Wednesday in hospital in [place]

## 2017-01-29 NOTE — ED Triage Notes (Signed)
Seen by PCP today, had blood work done and patient was called back due to blood HGB being 6.4.  Sent to ED for transfusion.

## 2017-01-29 NOTE — ED Notes (Signed)
Admitting MD requesting wait to transport to floor until she sees patient.

## 2017-01-29 NOTE — ED Notes (Signed)
Apolonio Schneiders RN to bedside to attempt IV insertion

## 2017-01-29 NOTE — ED Notes (Signed)
MD Rifenbark informed of failed attempts at IV insertion

## 2017-01-29 NOTE — ED Notes (Signed)
RN and MD at bedside for hemoccult

## 2017-01-29 NOTE — H&P (Addendum)
History and Physical   [facility] - [place] @ [facility] Admission History and Physical [employer], D.O.    Patient Name: Bethany Lee MR#: [id number] Date of Birth: [birth year] Date of Admission: 01/29/2017  Referring MD/NP/PA: Dr. Mable Paris Primary Care Physician: Tracie Harrier, MD  Chief Complaint:  Chief Complaint  Patient presents with  . Anemia    HPI: Bethany Lee is a 66 y.o. female with a known history of CHF, CAD, DM, ESRD on PD, HTN, HLD, hypothyroid presents to the emergency department for evaluation of anemia.  Patient was referred to the ED for evaluation of anemia discovered on labs. Patient complains of progressively worsening fatigue and shortness of breath over the past week to 2 weeks. While on vacation she described right-sided headache for which she went to an emergency department there and head CT was negative and hemoglobin was 8 at that time.  Patient denies fevers/chills, weakness, dizziness, chest pain,, N/V/C/D, abdominal pain, dysuria/frequency, changes in mental status. She denies melena, hematochezia and hematemesis. 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 Otherwise there has been no change in status. Patient has been taking medication as prescribed and there has been no recent change in medication or diet.  No recent antibiotics.  There has been no recent illness, hospitalizations, travel or sick contacts.    EMS/ED Course: Patient received NS, Aranesp, Protonix IV bolus, transfuse 2 units pRBCs.  Medical admission was requested for further management of symptomatic anemia secondary to bleed  Review of Systems:  CONSTITUTIONAL: Positive generalized fatigue as per history of present illness. No fever/chills, weakness, weight gain/loss, headache. EYES: No blurry or double vision. ENT: No tinnitus, postnasal drip, redness or soreness  of the oropharynx. RESPIRATORY: Positive shortness of breath and dyspnea on exertion No cough, wheeze.  No hemoptysis.  CARDIOVASCULAR: No chest pain, palpitations, syncope, orthopnea. No lower extremity edema.  GASTROINTESTINAL: No nausea, vomiting, abdominal pain, diarrhea, constipation.  No hematemesis, melena or hematochezia. GENITOURINARY: No dysuria, frequency, hematuria. ENDOCRINE: No polyuria or nocturia. No heat or cold intolerance. HEMATOLOGY: No anemia, bruising, bleeding. INTEGUMENTARY: No rashes, ulcers, lesions. MUSCULOSKELETAL: No arthritis, gout, dyspnea. NEUROLOGIC: No numbness, tingling, ataxia, seizure-type activity, weakness. PSYCHIATRIC: No anxiety, depression, insomnia.   Past Medical History:  Diagnosis Date  . Anemia   . CHF (congestive heart failure) ([place])   . Chronic kidney disease    peritoneal dialysis  . Complication of anesthesia   . Coronary artery disease   . Diabetes mellitus without complication ([place])   . Dialysis patient [facility])    Peritoneal dialysis patient  . Dyspnea    doe  . Edema   . Hypercholesterolemia   . Hypertension   . Hypothyroidism   . Neuropathy   . PONV (postoperative nausea and vomiting)   . Shingles    October-November 2017  . Tremors of nervous system    intermittent when taking gabapentin    Past Surgical History:  Procedure Laterality Date  . A/V SHUNT INTERVENTION N/A 01/16/2017   Procedure: A/V Shunt Intervention;  Surgeon: Katha Cabal, MD;  Location: [place] [facility];  Service: Cardiovascular;  Laterality: N/A;  . A/V SHUNTOGRAM Left 01/16/2017   Procedure: A/V Fistulagram;  Surgeon: Katha Cabal, MD;  Location: [place] [facility];  Service: Cardiovascular;  Laterality: Left;  . ABDOMINAL HYSTERECTOMY    . AV FISTULA PLACEMENT Left 11/30/2016  Procedure: ARTERIOVENOUS (AV) FISTULA CREATION ( EXPLORE FOR CREATION BRACHIOCEPHALIC);  Surgeon: Algernon Huxley, MD;  Location: [facility] ORS;  Service: Vascular;   Laterality: Left;  . CAPD INSERTION N/A 11/25/2015   Procedure: LAPAROSCOPIC INSERTION CONTINUOUS AMBULATORY PERITONEAL DIALYSIS  (CAPD) CATHETER;  Surgeon: Algernon Huxley, MD;  Location: [facility] ORS;  Service: Vascular;  Laterality: N/A;  . CAPD INSERTION N/A 01/06/2016   Procedure: LAPAROSCOPIC INSERTION CONTINUOUS AMBULATORY PERITONEAL DIALYSIS  (CAPD) CATHETER REVISION ;  Surgeon: Algernon Huxley, MD;  Location: [facility] OR[facility];  Service: Vascular;  Laterality: N/A;  . CAPD INSERTION N/A 10/11/2016   Procedure: LAPAROSCOPIC INSERTION CONTINUOUS AMBULATORY PERITONEAL DIALYSIS  (CAPD) CATHETER ( REVISION );  Surgeon: Algernon Huxley, MD;  Location: [facility] OR[facility];  Service: Vascular;  Laterality: N/A;  . CARDIAC CATHETERIZATION    . CATARACT EXTRACTION W/PHACO Right 05/25/2016   Procedure: CATARACT EXTRACTION PHACO AND INTRAOCULAR LENS PLACEMENT (IOC);  Surgeon: Eulogio Bear, MD;  Location: [facility] OR[facility];  Service: Ophthalmology;  Laterality: Right;  Lot # [id number] H [place]: 00:5.2 AP%: 11.3 CDE: 7.21  . CORONARY ANGIOPLASTY    . CORONARY STENT INTERVENTION N/A 09/07/2016   Procedure: Coronary Stent Intervention;  Surgeon: Isaias Cowman, MD;  Location: [place] [facility];  Service: Cardiovascular;  Laterality: N/A;  . DIALYSIS/PERMA CATHETER INSERTION N/A 09/21/2016   Procedure: Dialysis/Perma Catheter Insertion;  Surgeon: Algernon Huxley, MD;  Location: [place] [facility];  Service: Cardiovascular;  Laterality: N/A;  . DIALYSIS/PERMA CATHETER REMOVAL Right 11/30/2016   Procedure: DIALYSIS/PERMA CATHETER REMOVAL;  Surgeon: Algernon Huxley, MD;  Location: [facility];  Service: Vascular;  Laterality: Right;  . EYE SURGERY    . LEFT HEART CATH AND CORONARY ANGIOGRAPHY Left 09/07/2016   Procedure: Left Heart Cath and Coronary Angiography;  Surgeon: Isaias Cowman, MD;  Location: [place] [facility];  Service: Cardiovascular;  Laterality: Left;  . TUBAL LIGATION    . UPPER EXTREMITY ANGIOGRAPHY Left 01/19/2017   Procedure: Upper  Extremity Angiography;  Surgeon: Katha Cabal, MD;  Location: [place] [facility];  Service: Cardiovascular;  Laterality: Left;     reports that she quit smoking about 8 years ago. She has never used smokeless tobacco. She reports that she does not drink alcohol or use drugs.  Allergies  Allergen Reactions  . Penicillins     Childhood loss of consciousness  Has patient had a PCN reaction causing immediate rash, facial/tongue/throat swelling, SOB or lightheadedness with hypotension: Yes Has patient had a PCN reaction causing severe rash involving mucus membranes or skin necrosis: No Has patient had a PCN reaction that required hospitalization Unknown Has patient had a PCN reaction occurring within the last 10 years: Unknown If all of the above answers are "NO", then may proceed with Cephalosporin use.   . Valacyclovir Nausea Only    No family history on file.  Prior to Admission medications   Medication Sig Start Date End Date Taking? Authorizing Provider  amLODipine (NORVASC) 5 MG tablet Take 5 mg by mouth daily.   Yes [provider]  aspirin EC 81 MG tablet Take 1 tablet (81 mg total) by mouth daily. Start taking after NOV 15 Patient taking differently: Take 81 mg by mouth daily.  05/26/15  Yes Murlean Iba, MD  calcium acetate (PHOSLO) 667 MG capsule Take 2,001 mg by mouth 3 (three) times daily with meals.   Yes [provider]  carvedilol (COREG) 12.5 MG tablet Take 1 tablet (12.5 mg total) by mouth 2 (  two) times daily with a meal. Patient taking differently: Take 25 mg by mouth 2 (two) times daily with a meal.  07/20/16  Yes Fritzi Mandes, MD  clopidogrel (PLAVIX) 75 MG tablet Take 1 tablet (75 mg total) by mouth daily with breakfast. 09/08/16  Yes Paraschos, Alexander, MD  furosemide (LASIX) 80 MG tablet Take 80 mg by mouth 2 (two) times daily.   Yes [provider]  hydrALAZINE (APRESOLINE) 50 MG tablet Take 50 mg by mouth 2 (two) times daily.    Yes [provider]  insulin aspart (NOVOLOG FLEXPEN) 100 UNIT/ML FlexPen Inject 4-8 Units into the skin 3 (three) times daily with meals. Sliding scale below 200 take 4 units, 200-250=6 units, >250= 8 units 09/13/15  Yes [provider]  Insulin Glargine (TOUJEO SOLOSTAR) 300 UNIT/ML SOPN Inject 20 Units into the skin at bedtime. Patient taking differently: Inject 24 Units into the skin at bedtime.  12/21/15  Yes Theodoro Grist, MD  levothyroxine (SYNTHROID, LEVOTHROID) 75 MCG tablet Take 75 mcg by mouth daily before breakfast.   Yes [provider]  lisinopril (PRINIVIL,ZESTRIL) 10 MG tablet Take 5 mg by mouth daily.   Yes [provider]  metolazone (ZAROXOLYN) 5 MG tablet Take 2.5 mg by mouth daily.    Yes [provider]  Multiple Vitamins-Minerals (DIALYVITE 800/ULTRA D) TABS Take 1 tablet by mouth daily.   Yes [provider]  sodium bicarbonate 650 MG tablet Take 1,300 mg by mouth 2 (two) times daily.   Yes [provider]  vitamin B-12 (CYANOCOBALAMIN) 1000 MCG tablet Take 1,000 mcg by mouth daily.   Yes [provider]  gentamicin cream (GARAMYCIN) 0.1 % Apply 1 application topically daily. Patient taking differently: Apply 1 application topically daily. Catheter dressing changes 12/21/15   Theodoro Grist, MD  HYDROcodone-acetaminophen (NORCO) 5-325 MG tablet Take 1 tablet by mouth every 6 (six) hours as needed for moderate pain. Patient not taking: Reported on 01/05/2017 11/30/16   Algernon Huxley, MD    Physical Exam: Vitals:   01/29/17 1733 01/29/17 1735  BP:  (!) 125/47  Pulse:  82  Resp:  16  Temp:  98.3 F (36.8 C)  TempSrc:  Oral  SpO2:  100%  Weight: 63.5 kg (140 lb)   Height: 5' (1.524 m)     GENERAL: 66 y.o.-year-old female patient, well-developed, well-nourished lying in the bed in no acute distress.  Pleasant and cooperative.   HEENT: Head atraumatic, normocephalic. Pupils equal, round, reactive to  light and accommodation. No scleral icterus. Extraocular muscles intact.  Mucus membranes moist. NECK: Supple, full range of motion.  CHEST: Normal breath sounds bilaterally. No wheezing, rales, rhonchi or crackles. No use of accessory muscles of respiration.   CARDIOVASCULAR: S1, S2 normal. No murmurs, rubs, or gallops. Cap refill <2 seconds. Pulses intact distally.  ABDOMEN: Soft, nondistended, nontender. No rebound, guarding, rigidity. Normoactive bowel sounds present in all four quadrants.   EXTREMITIES: No pedal edema, cyanosis, or clubbing. No calf tenderness or Homan's sign.  NEUROLOGIC: The patient is alert and oriented x 3. Cranial nerves II through XII are grossly intact with no focal sensorimotor deficit. Muscle strength 5/5 in all extremities. Sensation intact. Gait not checked. PSYCHIATRIC:  Normal affect, mood, thought content. SKIN: Warm, dry, and intact without obvious rash, lesion, or ulcer.   *Guaiac positive brown stool per ED physician rectal exam documentation. Labs on Admission:  CBC:  Recent Labs Lab 01/29/17 1738  WBC 6.9  HGB 6.3*  HCT 18.5*  MCV 93.4  PLT 440   Basic Metabolic Panel:  Recent Labs Lab 01/29/17 1738  NA 137  K 4.8  CL 97*  CO2 23  GLUCOSE 136*  BUN 93*  CREATININE 14.64*  CALCIUM 8.2*   GFR: Estimated Creatinine Clearance: 3.1 mL/min (A) (by C-G formula based on SCr of 14.64 mg/dL (H)). Liver Function Tests:  Recent Labs Lab 01/29/17 1738  AST 14*  ALT 14  ALKPHOS 43  BILITOT 0.7  PROT 5.8*  ALBUMIN 2.8*   No results for input(s): LIPASE, AMYLASE in the last 168 hours. No results for input(s): AMMONIA in the last 168 hours. Coagulation Profile: No results for input(s): INR, PROTIME in the last 168 hours. Cardiac Enzymes:  Recent Labs Lab 01/29/17 1431  TROPONINI <0.03   BNP (last 3 results) No results for input(s): PROBNP in the last 8760 hours. HbA1C: No results for input(s): HGBA1C in the last 72  hours. CBG: No results for input(s): GLUCAP in the last 168 hours. Lipid Profile: No results for input(s): CHOL, HDL, LDLCALC, TRIG, CHOLHDL, LDLDIRECT in the last 72 hours. Thyroid Function Tests: No results for input(s): TSH, T4TOTAL, FREET4, T3FREE, THYROIDAB in the last 72 hours. Anemia Panel: No results for input(s): VITAMINB12, FOLATE, FERRITIN, TIBC, IRON, RETICCTPCT in the last 72 hours. Urine analysis:    Component Value Date/Time   COLORURINE STRAW (A) 05/04/2016 1420   APPEARANCEUR CLEAR (A) 05/04/2016 1420   LABSPEC 1.011 05/04/2016 1420   PHURINE 5.0 05/04/2016 1420   GLUCOSEU 150 (A) 05/04/2016 1420   HGBUR 1+ (A) 05/04/2016 1420   BILIRUBINUR NEGATIVE 05/04/2016 1420   KETONESUR NEGATIVE 05/04/2016 1420   PROTEINUR >500 (A) 05/04/2016 1420   NITRITE NEGATIVE 05/04/2016 1420   LEUKOCYTESUR NEGATIVE 05/04/2016 1420   Sepsis Labs: @LABRCNTIP (procalcitonin:4,lacticidven:4) )No results found for this or any previous visit (from the past 240 hour(s)).   Radiological Exams on Admission: No results found.  EKG: Pending  Assessment/Plan  This is a 66 y.o. female with a history of CHF, CAD, DM, ESRD on PD, HTN, HLD, hypothyroid now being admitted with:  #. GI Bleed - Admit inpatient - IV Protonix 40mg  BID - Serial CBCs, transfuse as needed. Check ferritin, B12, folate - Nothing by mouth - IV fluid hydration - Hold anticoagulants - GI consultation has been requested  #. HTN - Continue Lisinopril, Coreg, Lasix, hydralazine, Norvasc  #. ESRD on PD - Continue Phoslo, NaHCO3, Zaroxolyn, Lasix - Nephrology for comanagement  #. History of Hypothyroid - Continue Synthroid  #. History of CAD - Continue Plavix  #. History of diabetes - Accuchecks q4h with RISS coverage - Half dose on Lantus  Admission status: Inpatient IV Fluids: NS Diet/Nutrition: NPO Consults called: GI, Nephrology DVT Px: SCDs and early ambulation. Code Status: Full Code   Disposition Plan: To home in 1-2 days  All the records are reviewed and case discussed with ED provider. Management plans discussed with the patient and/or family who express understanding and agree with plan of care.  Camaryn Lumbert D.O. on 01/29/2017 at 8:29 PM Between 7am to 6pm - Pager - [phone] After 6pm go to [email or web address] - Proofreader [facility] Office [phone] CC: Primary care physician; Tracie Harrier, MD   01/29/2017, 8:29 PM

## 2017-01-29 NOTE — ED Notes (Signed)
This RN made two unsuccessful attempts at IV insertion. RN Apolonio Schneiders requested attempt insertion

## 2017-01-30 DIAGNOSIS — D649 Anemia, unspecified: Secondary | ICD-10-CM

## 2017-01-30 LAB — BASIC METABOLIC PANEL
Anion gap: 16 — ABNORMAL HIGH (ref 5–15)
BUN: 99 mg/dL — AB (ref 6–20)
CHLORIDE: 100 mmol/L — AB (ref 101–111)
CO2: 20 mmol/L — AB (ref 22–32)
CREATININE: 15.48 mg/dL — AB (ref 0.44–1.00)
Calcium: 7.6 mg/dL — ABNORMAL LOW (ref 8.9–10.3)
GFR calc Af Amer: 2 mL/min — ABNORMAL LOW (ref >60)
GFR calc non Af Amer: 2 mL/min — ABNORMAL LOW (ref >60)
Glucose, Bld: 72 mg/dL (ref 65–99)
Potassium: 5.1 mmol/L (ref 3.5–5.1)
Sodium: 136 mmol/L (ref 135–145)

## 2017-01-30 LAB — IRON AND TIBC
Iron: 88 ug/dL (ref 28–170)
SATURATION RATIOS: 36 % — AB (ref 10.4–31.8)
TIBC: 245 ug/dL — AB (ref 250–450)
UIBC: 157 ug/dL

## 2017-01-30 LAB — CBC
HEMATOCRIT: 25.9 % — AB (ref 35.0–47.0)
HEMATOCRIT: 27.2 % — AB (ref 35.0–47.0)
Hemoglobin: 9.1 g/dL — ABNORMAL LOW (ref 12.0–16.0)
Hemoglobin: 9.5 g/dL — ABNORMAL LOW (ref 12.0–16.0)
MCH: 31.1 pg (ref 26.0–34.0)
MCH: 30.6 pg (ref 26.0–34.0)
MCHC: 35.1 g/dL (ref 32.0–36.0)
MCHC: 34.8 g/dL (ref 32.0–36.0)
MCV: 88.8 fL (ref 80.0–100.0)
MCV: 88 fL (ref 80.0–100.0)
PLATELETS: 161 10*3/uL (ref 150–440)
Platelets: 159 10*3/uL (ref 150–440)
RBC: 2.92 MIL/uL — AB (ref 3.80–5.20)
RBC: 3.09 MIL/uL — ABNORMAL LOW (ref 3.80–5.20)
RDW: 17.4 % — ABNORMAL HIGH (ref 11.5–14.5)
RDW: 17.9 % — AB (ref 11.5–14.5)
WBC: 7.7 10*3/uL (ref 3.6–11.0)
WBC: 8.2 10*3/uL (ref 3.6–11.0)

## 2017-01-30 LAB — GLUCOSE, CAPILLARY
GLUCOSE-CAPILLARY: 122 mg/dL — AB (ref 65–99)
GLUCOSE-CAPILLARY: 114 mg/dL — AB (ref 65–99)
Glucose-Capillary: 79 mg/dL (ref 65–99)
Glucose-Capillary: 80 mg/dL (ref 65–99)
Glucose-Capillary: 82 mg/dL (ref 65–99)
Glucose-Capillary: 93 mg/dL (ref 65–99)

## 2017-01-30 LAB — FOLATE: Folate: 52.2 ng/mL (ref >5.9)

## 2017-01-30 LAB — VITAMIN B12: Vitamin B-12: 1743 pg/mL — ABNORMAL HIGH (ref 180–914)

## 2017-01-30 LAB — FERRITIN: Ferritin: 636 ng/mL — ABNORMAL HIGH (ref 11–307)

## 2017-01-30 LAB — HEMOGLOBIN: Hemoglobin: 9.1 g/dL — ABNORMAL LOW (ref 12.0–16.0)

## 2017-01-30 MED ORDER — EPOETIN ALFA 10000 UNIT/ML IJ SOLN: 20000.0000 [IU] | Freq: Once | INTRAMUSCULAR | Status: DC

## 2017-01-30 MED ORDER — GENTAMICIN SULFATE 0.1 % EX OINT
TOPICAL_OINTMENT | Freq: Every day | CUTANEOUS | Status: DC
  Filled 2017-01-30: qty 15

## 2017-01-30 MED ORDER — GENTAMICIN SULFATE 0.1 % EX CREA
1.0000 "application " | TOPICAL_CREAM | Freq: Every day | CUTANEOUS | Status: DC
  Filled 2017-01-30: qty 15

## 2017-01-30 MED ORDER — INSULIN ASPART 100 UNIT/ML ~~LOC~~ SOLN
0.0000 [IU] | SUBCUTANEOUS | Status: DC
  Filled 2017-01-30: qty 1

## 2017-01-30 MED ORDER — HEPARIN 1000 UNIT/ML FOR PERITONEAL DIALYSIS
500.0000 [IU] | INTRAMUSCULAR | Status: DC | PRN
  Filled 2017-01-30: qty 0.5

## 2017-01-30 NOTE — Progress Notes (Signed)
PD start. Pt a&ox3, no c/o, no complications with PD start. Dressing changed and exit site care performed. Pre PD weight 65 kg.

## 2017-01-30 NOTE — Progress Notes (Signed)
[place] at [place] NAME: Bethany Lee    MR#:  [id number]  DATE OF BIRTH:  08-16-1950  SUBJECTIVE:  CHIEF COMPLAINT:   Chief Complaint  Patient presents with  . Anemia  Feels very tired, hard of hearing REVIEW OF SYSTEMS:  Review of Systems  Constitutional: Positive for malaise/fatigue. Negative for chills, fever and weight loss.  HENT: Negative for nosebleeds and sore throat.   Eyes: Negative for blurred vision.  Respiratory: Negative for cough, shortness of breath and wheezing.   Cardiovascular: Negative for chest pain, orthopnea, leg swelling and PND.  Gastrointestinal: Negative for abdominal pain, constipation, diarrhea, heartburn, nausea and vomiting.  Genitourinary: Negative for dysuria and urgency.  Musculoskeletal: Negative for back pain.  Skin: Negative for rash.  Neurological: Positive for weakness. Negative for dizziness, speech change, focal weakness and headaches.  Endo/Heme/Allergies: Does not bruise/bleed easily.  Psychiatric/Behavioral: Negative for depression.   DRUG ALLERGIES:   Allergies  Allergen Reactions  . Penicillins     Childhood loss of consciousness  Has patient had a PCN reaction causing immediate rash, facial/tongue/throat swelling, SOB or lightheadedness with hypotension: Yes Has patient had a PCN reaction causing severe rash involving mucus membranes or skin necrosis: No Has patient had a PCN reaction that required hospitalization Unknown Has patient had a PCN reaction occurring within the last 10 years: Unknown If all of the above answers are "NO", then may proceed with Cephalosporin use.   . Valacyclovir Nausea Only   VITALS:  Blood pressure (!) 125/41, pulse 72, temperature 98.1 F (36.7 C), temperature source Oral, resp. rate 18, height 5' (1.524 m), weight 66.5 kg (146 lb 11.2 oz), SpO2 99 %. PHYSICAL EXAMINATION:  Physical Exam  Constitutional: She is oriented to person, place, and time  and well-developed, well-nourished, and in no distress.  HENT:  Head: Normocephalic and atraumatic.  Eyes: Conjunctivae and EOM are normal. Pupils are equal, round, and reactive to light.  Neck: Normal range of motion. Neck supple. No tracheal deviation present. No thyromegaly present.  Cardiovascular: Normal rate, regular rhythm and normal heart sounds.   Pulmonary/Chest: Effort normal and breath sounds normal. No respiratory distress. She has no wheezes. She exhibits no tenderness.  Abdominal: Soft. Bowel sounds are normal. She exhibits no distension. There is no tenderness.  Musculoskeletal: Normal range of motion.  Neurological: She is alert and oriented to person, place, and time. No cranial nerve deficit.  Skin: Skin is warm and dry. No rash noted. No pallor.  Psychiatric: Mood and affect normal.   LABORATORY PANEL:  Female CBC  Recent Labs Lab 01/30/17 1038  WBC 8.2  HGB 9.5*  HCT 27.2*  PLT 159   ------------------------------------------------------------------------------------------------------------------ Chemistries   Recent Labs Lab 01/29/17 1738 01/30/17 0633  NA 137 136  K 4.8 5.1  CL 97* 100*  CO2 23 20*  GLUCOSE 136* 72  BUN 93* 99*  CREATININE 14.64* 15.48*  CALCIUM 8.2* 7.6*  AST 14*  --   ALT 14  --   ALKPHOS 43  --   BILITOT 0.7  --    RADIOLOGY:  No results found. ASSESSMENT AND PLAN:  This is a 66 y.o. female with a history of CHF, CAD, DM, ESRD on PD, HTN, HLD, hypothyroid admitted with:  #. GI Bleed - IV Protonix 40mg  BID - Transfuse as needed.  - CLD - IV fluid hydration - Hold anticoagulants - GI consultation pending  #. HTN - Continue Lisinopril, Coreg, Lasix,  hydralazine, Norvasc  #. ESRD on PD - Continue Phoslo, NaHCO3, Zaroxolyn, Lasix - Nephrology for dialysis need  #. History of Hypothyroid - Continue Synthroid  #. History of CAD - Continue Plavix  #. History of diabetes - Accuchecks q4h with RISS  coverage - Lantus 12 units subcutaneous daily   All the records are reviewed and case discussed with Care Management/Social Worker. Management plans discussed with the patient, family and they are in agreement.  CODE STATUS: Full Code  TOTAL TIME TAKING CARE OF THIS PATIENT: 35 minutes.   More than 50% of the time was spent in counseling/coordination of care: YES  POSSIBLE D/C IN 1-2 DAYS, DEPENDING ON CLINICAL CONDITION.  And GI evaluation   Max Sane M.D on 01/30/2017 at 2:57 PM  Between 7am to 6pm - Pager - [phone]  After 6pm go to [email or web address] - Proofreader  [facility]  Office  [phone]  CC: Primary care physician; Tracie Harrier, MD  Note: This dictation was prepared with Dragon dictation along with smaller phrase technology. Any transcriptional errors that result from this process are unintentional.

## 2017-01-30 NOTE — Progress Notes (Signed)
PD start 

## 2017-01-30 NOTE — Evaluation (Signed)
Physical Therapy Evaluation Patient Details Name: Bethany Lee MRN: [id number] DOB: 04/10/1951 Today's Date: 01/30/2017   History of Present Illness  66 y/o admitted on 7/9 for a GI bleed and symptomatic anemia. Pt seen for fistula placement on 11/30/16. Following this, she developed subclavian steal syndrome, which required an angioplasty and stent placement in her LUE on 01/19/17. Pt went to ED on 01/25/17 in [place] with c/c of headache. CT scan was negative and her HGB was found to be 8. Due to increased HA and fatigue, pt came to [facility] ED on 7/9 where her HGB was found to be 6.4. An emergent blood transfusion was performed, and a nephrology consult is still pending. PMH includes chronic anemia, CKD, peritoneal dialysis daily, CAD, DM, CHF, HTN, tremor, and neuropathy.    Clinical Impression  Pt is a pleasant 66 year old admitted for GI bleed and symptomatic anemia. Prior to admission, pt drove, ambulated without an AD and was entirely independent in all ADL's. Pt performs bed mobility and transfers with modified independence due to increased time required compared to baseline. 5x sit to stand performed in 8s. Pt ambulates with supervision and RW due to unsteadiness while ambulating without AD (pt also tended to reach for furniture or PT without RW present). Pt ambulated 155ft with RW this session. Pt demonstrates deficits in weakness and gait unsteadiness. Would benefit from skilled PT to address above deficits and promote optimal return to PLOF. Pt is motivated to participate in therapy. PT recommends dc home with home health PT.     Follow Up Recommendations Home health PT    Equipment Recommendations  None recommended by PT    Recommendations for Other Services       Precautions / Restrictions Precautions Precautions: Fall Restrictions Weight Bearing Restrictions: No      Mobility  Bed Mobility Overal bed mobility: Modified Independent             General bed  mobility comments: Mod I for supine to/from sit due to increased time required compared to baseline. No dizziness noted at EOB.   Transfers Overall transfer level: Modified independent Equipment used: Rolling walker (2 wheeled)             General transfer comment: 5x sit to/from stand performed in 8s with RW and BUE use. Pt steady on feet and no dizziness noted.  Ambulation/Gait Ambulation/Gait assistance: Supervision Ambulation Distance (Feet): 150 Feet Assistive device: Rolling walker (2 wheeled) Gait Pattern/deviations: Step-through pattern;Trunk flexed;Wide base of support Gait velocity: 75ft/s   General Gait Details: Ambulated around nurse's station with supervision for safety. Pt slightly unsteady with RW, but able to make corrections/turns with min verbal cueing.   Stairs            Wheelchair Mobility    Modified Rankin (Stroke Patients Only)       Balance Overall balance assessment: No apparent balance deficits (not formally assessed) (no history of falls)                                           Pertinent Vitals/Pain Pain Assessment: No/denies pain    Home Living Family/patient expects to be discharged to:: Private residence Living Arrangements: Spouse/significant other Available Help at Discharge: Family Type of Home: House Home Access: Other (comment) (Pt unable to live on 1st floor)     Home Layout: Multi-level;Other (Comment) (  Lives on 2nd floor - must negotiate 13-14 steps) Home Equipment: Gilford Rile - 2 wheels;Cane - single point      Prior Function Level of Independence: Independent         Comments: Drove and ambulated without AD.     Hand Dominance        Extremity/Trunk Assessment   Upper Extremity Assessment Upper Extremity Assessment: Generalized weakness (grossly 4/5 BUE's (not formally assessed with MMT))    Lower Extremity Assessment Lower Extremity Assessment: Generalized weakness (4-/5 B for knee  flex/ext, 4/5 B for DF/PF)       Communication   Communication: No difficulties  Cognition Arousal/Alertness: Awake/alert Behavior During Therapy: WFL for tasks assessed/performed Overall Cognitive Status: Within Functional Limits for tasks assessed                                        General Comments      Exercises Other Exercises Other Exercises: Ambulated 15ft without AD - pt demonstrated increased lateral trunk sway side-to-side, and pt mildly unsteady on feet. Pt tended to reach for furniture or PT hand when RW not present. Pt safer to ambulate with RW.  Other Exercises: Supine ther-ex x10 B included SLR and heel slide. Performed with supervision.   Assessment/Plan    PT Assessment Patient needs continued PT services  PT Problem List Decreased strength;Decreased balance       PT Treatment Interventions Gait training;Stair training;Therapeutic activities;Therapeutic exercise;Balance training;Patient/family education    PT Goals (Current goals can be found in the Care Plan section)  Acute Rehab PT Goals Patient Stated Goal: to get stronger PT Goal Formulation: With patient Time For Goal Achievement: 02/13/17 Potential to Achieve Goals: Good    Frequency Min 2X/week   Barriers to discharge        Co-evaluation               AM-PAC PT "6 Clicks" Daily Activity  Outcome Measure Difficulty turning over in bed (including adjusting bedclothes, sheets and blankets)?: None Difficulty moving from lying on back to sitting on the side of the bed? : None Difficulty sitting down on and standing up from a chair with arms (e.g., wheelchair, bedside commode, etc,.)?: None Help needed moving to and from a bed to chair (including a wheelchair)?: None Help needed walking in hospital room?: A Little Help needed climbing 3-5 steps with a railing? : A Little 6 Click Score: 22    End of Session Equipment Utilized During Treatment: Gait belt Activity  Tolerance: Patient tolerated treatment well Patient left: in bed;with bed alarm set;with call bell/phone within reach;with family/visitor present Nurse Communication: Mobility status PT Visit Diagnosis: Unsteadiness on feet (R26.81);Muscle weakness (generalized) (M62.81)    Time: 9169-4503 PT Time Calculation (min) (ACUTE ONLY): 22 min   Charges:         PT G Codes:   PT G-Codes **NOT FOR INPATIENT CLASS** Functional Assessment Tool Used: AM-PAC 6 Clicks Basic Mobility;Other (comment) (gait speed and 5x sit to stand)    Donaciano Eva, PT, SPT  [facility] 01/30/2017, 9:42 AM

## 2017-01-30 NOTE — Consult Note (Signed)
[name] MD, MRCP(U.K) [address]  [place]  [place], [place] [address]  Main: [phone]  Fax: [phone]  Consultation  Referring Provider:     Dr Manuella Ghazi  Primary Care Physician:  Tracie Harrier, MD Primary Gastroenterologist:  None        Reason for Consultation:     GI bleed/anemia   Date of Admission:  01/29/2017 Date of Consultation:  01/30/2017         HPI:   Bethany Lee is a 66 y.o. female with history of CAD, ESRD, HTN was seen at the ER as she was incidently found to be anemic on her labs. She was recently seen at the ER for a headache and when labs were done was found to be anemic . Hb in 09/2016 was 10.2 grams with mcv 92. On admission Hb 6.3 with mcv 93 with normal iron studies, b12 and total bilirubin.  She has had some recent history of shortness of breath and fatigue. She is on asprin and plavix.   She denies any hematemesis, melena, blood in stool, nasal bleeds or blood inurine.Feels comfortable. Denies any abdominal pain.   Past Medical History:  Diagnosis Date  . Anemia   . CHF (congestive heart failure) ([place])   . Chronic kidney disease    peritoneal dialysis  . Complication of anesthesia   . Coronary artery disease   . Diabetes mellitus without complication ([place])   . Dialysis patient [facility])    Peritoneal dialysis patient  . Dyspnea    doe  . Edema   . Hypercholesterolemia   . Hypertension   . Hypothyroidism   . Neuropathy   . PONV (postoperative nausea and vomiting)   . Shingles    October-November 2017  . Tremors of nervous system    intermittent when taking gabapentin    Past Surgical History:  Procedure Laterality Date  . A/V SHUNT INTERVENTION N/A 01/16/2017   Procedure: A/V Shunt Intervention;  Surgeon: Katha Cabal, MD;  Location: [place] [facility];  Service: Cardiovascular;  Laterality: N/A;  . A/V SHUNTOGRAM Left 01/16/2017   Procedure: A/V Fistulagram;  Surgeon: Katha Cabal, MD;  Location: [place]  [facility];  Service: Cardiovascular;  Laterality: Left;  . ABDOMINAL HYSTERECTOMY    . AV FISTULA PLACEMENT Left 11/30/2016   Procedure: ARTERIOVENOUS (AV) FISTULA CREATION ( EXPLORE FOR CREATION BRACHIOCEPHALIC);  Surgeon: Algernon Huxley, MD;  Location: [facility];  Service: Vascular;  Laterality: Left;  . CAPD INSERTION N/A 11/25/2015   Procedure: LAPAROSCOPIC INSERTION CONTINUOUS AMBULATORY PERITONEAL DIALYSIS  (CAPD) CATHETER;  Surgeon: Algernon Huxley, MD;  Location: [facility] OR[facility];  Service: Vascular;  Laterality: N/A;  . CAPD INSERTION N/A 01/06/2016   Procedure: LAPAROSCOPIC INSERTION CONTINUOUS AMBULATORY PERITONEAL DIALYSIS  (CAPD) CATHETER REVISION ;  Surgeon: Algernon Huxley, MD;  Location: [facility];  Service: Vascular;  Laterality: N/A;  . CAPD INSERTION N/A 10/11/2016   Procedure: LAPAROSCOPIC INSERTION CONTINUOUS AMBULATORY PERITONEAL DIALYSIS  (CAPD) CATHETER ( REVISION );  Surgeon: Algernon Huxley, MD;  Location: [facility];  Service: Vascular;  Laterality: N/A;  . CARDIAC CATHETERIZATION    . CATARACT EXTRACTION W/PHACO Right 05/25/2016   Procedure: CATARACT EXTRACTION PHACO AND INTRAOCULAR LENS PLACEMENT (IOC);  Surgeon: Eulogio Bear, MD;  Location: [facility];  Service: Ophthalmology;  Laterality: Right;  Lot # [id number] H [place]: 00:5.2 AP%: 11.3 CDE: 7.21  . CORONARY ANGIOPLASTY    . CORONARY STENT INTERVENTION N/A 09/07/2016  Procedure: Coronary Stent Intervention;  Surgeon: Isaias Cowman, MD;  Location: [place] [facility];  Service: Cardiovascular;  Laterality: N/A;  . DIALYSIS/PERMA CATHETER INSERTION N/A 09/21/2016   Procedure: Dialysis/Perma Catheter Insertion;  Surgeon: Algernon Huxley, MD;  Location: [place] [facility];  Service: Cardiovascular;  Laterality: N/A;  . DIALYSIS/PERMA CATHETER REMOVAL Right 11/30/2016   Procedure: DIALYSIS/PERMA CATHETER REMOVAL;  Surgeon: Algernon Huxley, MD;  Location: [facility];  Service: Vascular;  Laterality: Right;  . EYE SURGERY    . LEFT HEART CATH AND CORONARY  ANGIOGRAPHY Left 09/07/2016   Procedure: Left Heart Cath and Coronary Angiography;  Surgeon: Isaias Cowman, MD;  Location: [place] [facility];  Service: Cardiovascular;  Laterality: Left;  . TUBAL LIGATION    . UPPER EXTREMITY ANGIOGRAPHY Left 01/19/2017   Procedure: Upper Extremity Angiography;  Surgeon: Katha Cabal, MD;  Location: [place] [facility];  Service: Cardiovascular;  Laterality: Left;    Prior to Admission medications   Medication Sig Start Date End Date Taking? Authorizing Provider  amLODipine (NORVASC) 5 MG tablet Take 5 mg by mouth daily.   Yes [provider]  aspirin EC 81 MG tablet Take 1 tablet (81 mg total) by mouth daily. Start taking after NOV 15 Patient taking differently: Take 81 mg by mouth daily.  05/26/15  Yes Murlean Iba, MD  calcium acetate (PHOSLO) 667 MG capsule Take 2,001 mg by mouth 3 (three) times daily with meals.   Yes [provider]  carvedilol (COREG) 12.5 MG tablet Take 1 tablet (12.5 mg total) by mouth 2 (two) times daily with a meal. Patient taking differently: Take 25 mg by mouth 2 (two) times daily with a meal.  07/20/16  Yes Fritzi Mandes, MD  clopidogrel (PLAVIX) 75 MG tablet Take 1 tablet (75 mg total) by mouth daily with breakfast. 09/08/16  Yes Paraschos, Alexander, MD  furosemide (LASIX) 80 MG tablet Take 80 mg by mouth 2 (two) times daily.   Yes [provider]  hydrALAZINE (APRESOLINE) 50 MG tablet Take 50 mg by mouth 2 (two) times daily.   Yes [provider]  insulin aspart (NOVOLOG FLEXPEN) 100 UNIT/ML FlexPen Inject 4-8 Units into the skin 3 (three) times daily with meals. Sliding scale below 200 take 4 units, 200-250=6 units, >250= 8 units 09/13/15  Yes [provider]  Insulin Glargine (TOUJEO SOLOSTAR) 300 UNIT/ML SOPN Inject 20 Units into the skin at bedtime. Patient taking differently: Inject 24 Units into the skin at bedtime.  12/21/15  Yes Theodoro Grist, MD  levothyroxine  (SYNTHROID, LEVOTHROID) 75 MCG tablet Take 75 mcg by mouth daily before breakfast.   Yes [provider]  lisinopril (PRINIVIL,ZESTRIL) 10 MG tablet Take 5 mg by mouth daily.   Yes [provider]  metolazone (ZAROXOLYN) 5 MG tablet Take 2.5 mg by mouth daily.    Yes [provider]  Multiple Vitamins-Minerals (DIALYVITE 800/ULTRA D) TABS Take 1 tablet by mouth daily.   Yes [provider]  sodium bicarbonate 650 MG tablet Take 1,300 mg by mouth 2 (two) times daily.   Yes [provider]  vitamin B-12 (CYANOCOBALAMIN) 1000 MCG tablet Take 1,000 mcg by mouth daily.   Yes [provider]  gentamicin cream (GARAMYCIN) 0.1 % Apply 1 application topically daily. Patient taking differently: Apply 1 application topically daily. Catheter dressing changes 12/21/15   Theodoro Grist, MD  HYDROcodone-acetaminophen (NORCO) 5-325 MG tablet Take 1 tablet by mouth every 6 (six) hours as needed  for moderate pain. Patient not taking: Reported on 01/05/2017 11/30/16   Algernon Huxley, MD    No family history on file.   Social History  Substance Use Topics  . Smoking status: Former Smoker    Quit date: 10/09/2008  . Smokeless tobacco: Never Used  . Alcohol use No    Allergies as of 01/29/2017 - Review Complete 01/29/2017  Allergen Reaction Noted  . Penicillins  05/25/2015  . Valacyclovir Nausea Only 05/10/2016    Review of Systems:    All systems reviewed and negative except where noted in HPI.   Physical Exam:  Vital signs in last 24 hours: Temp:  [98.2 F (36.8 C)-98.7 F (37.1 C)] 98.6 F (37 C) (07/10 0531) Pulse Rate:  [77-88] 77 (07/10 0531) Resp:  [16-22] 22 (07/10 0531) BP: (120-139)/(40-50) 139/50 (07/10 0531) SpO2:  [96 %-100 %] 96 % (07/10 0531) Weight:  [140 lb (63.5 kg)-147 lb 3.2 oz (66.8 kg)] 147 lb 3.2 oz (66.8 kg) (07/10 0537) Last BM Date: 01/27/17 General:   Pleasant, cooperative in NAD Head:  Normocephalic and  atraumatic. Eyes:   No icterus.   Conjunctiva pink. PERRLA. Ears:  Normal auditory acuity. Neck:  Supple; no masses or thyroidomegaly Lungs: Respirations even and unlabored. Lungs clear to auscultation bilaterally.   No wheezes, crackles, or rhonchi.  Heart:  Regular rate and rhythm;  Without murmur, clicks, rubs or gallops Abdomen:  Soft, nondistended, nontender. Normal bowel sounds. No appreciable masses or hepatomegaly.  No rebound or guarding. Dialysis catheter in LLQ of abdomen  Rectal:  Not performed. Msk:  Symmetrical without gross deformities.   Extremities:  Without edema, cyanosis or clubbing. Neurologic:  Alert and oriented x3;  grossly normal neurologically. Cervical Nodes:  No significant cervical adenopathy. Psych:  Alert and cooperative. Normal affect.  LAB RESULTS:  Recent Labs  01/29/17 1738 01/29/17 2230 01/30/17 0633  WBC 6.9 6.6 7.7  HGB 6.3* 6.2* 9.1*  HCT 18.5* 18.2* 25.9*  PLT 185 168 161   BMET  Recent Labs  01/29/17 1738 01/30/17 0633  NA 137 136  K 4.8 5.1  CL 97* 100*  CO2 23 20*  GLUCOSE 136* 72  BUN 93* 99*  CREATININE 14.64* 15.48*  CALCIUM 8.2* 7.6*   LFT  Recent Labs  01/29/17 1738  PROT 5.8*  ALBUMIN 2.8*  AST 14*  ALT 14  ALKPHOS 43  BILITOT 0.7   PT/INR No results for input(s): LABPROT, INR in the last 72 hours.  STUDIES: No results found.    Impression / Plan:   Euleta Belson is a 66 y.o. y/o female with chronic normocytic anemia admitted when incidenatlly found to have a drop in Hb from 10 grams in 09/2016 to 6 grams this admission . No overt bleeding *. She is on Asprin, plavix and ESRD on dialysis. Normal iron studies.   Plan  1. PPI 2. Transfuse and monitor for an active bleed 3. Since she is on Plavix she cannot have an endoscopy atleast for 4-5 days off it especially since she has ESRD and would have qualitative platelet dysfunction. The earliest she can undergo endoscopy would be Saturday. Choices are if  she has no obvious overt bleeding , can have EGD+colonoscopy as an out patient but if dropping can be done on Saturday.   4 Suggest check Hb later today and if stable can eat and drink.   I have discussed alternative options, risks & benefits,  which include, but are not limited  to, bleeding, infection, perforation,respiratory complication & drug reaction.  The patient agrees with this plan & written consent will be obtained.     Thank you for involving me in the care of this patient.      LOS: 1 day   [name], MD  01/30/2017, 10:08 AM

## 2017-01-30 NOTE — Progress Notes (Signed)
[facility], [place] 01/30/17  Subjective:   Patient known to our practice from outpatient dialysis. When she was on vacation in [place], [place] she felt pain behind right ear and dizziness. The symptoms resolved after she took Tylenol.   She went to see her primary care.  The hemoglobin level done at [facility] office showed severe anemia.  It was confirmed in the emergency room where hemoglobin was 6.2.  She was given 2 units of blood transfusion.  Today, she feels well.  Sipping on her coffee when seen.  No nausea or vomiting reported.  No frank blood in the stool.  Objective:  Vital signs in last 24 hours:  Temp:  [98.1 F (36.7 C)-98.7 F (37.1 C)] 98.1 F (36.7 C) (07/10 1206) Pulse Rate:  [72-88] 72 (07/10 1206) Resp:  [16-22] 18 (07/10 1206) BP: (120-139)/(40-50) 125/41 (07/10 1206) SpO2:  [96 %-100 %] 99 % (07/10 1206) Weight:  [63.5 kg (140 lb)-66.8 kg (147 lb 3.2 oz)] 66.5 kg (146 lb 11.2 oz) (07/10 1133)  Weight change:  Filed Weights   01/29/17 1733 01/30/17 0537 01/30/17 1133  Weight: 63.5 kg (140 lb) 66.8 kg (147 lb 3.2 oz) 66.5 kg (146 lb 11.2 oz)    Intake/Output:    Intake/Output Summary (Last 24 hours) at 01/30/17 1502 Last data filed at 01/30/17 1412  Gross per 24 hour  Intake             1280 ml  Output                0 ml  Net             1280 ml     Physical Exam: General: No acute distress, sitting in the bed  HEENT Anicteric, moist oral mucous membranes  Neck supple  Pulm/lungs Clear to auscultation  CVS/Heart irregular rhythm, no rub or gallop  Abdomen:  Soft, nontender  Extremities: No edema  Neurologic: Alert, oriented, and able to answer questions  Skin: No acute rashes  Access: PD catheter, left upper extremity developing AV fistula       Basic Metabolic Panel:   Recent Labs Lab 01/29/17 1738 01/30/17 0633  NA 137 136  K 4.8 5.1  CL 97* 100*  CO2 23 20*  GLUCOSE 136* 72  BUN 93* 99*  CREATININE 14.64*  15.48*  CALCIUM 8.2* 7.6*     CBC:  Recent Labs Lab 01/29/17 1738 01/29/17 2230 01/30/17 0633 01/30/17 1038  WBC 6.9 6.6 7.7 8.2  HGB 6.3* 6.2* 9.1* 9.5*  HCT 18.5* 18.2* 25.9* 27.2*  MCV 93.4 93.7 88.8 88.0  PLT 185 168 161 159     Lab Results  Component Value Date   HEPBSAG Negative 09/22/2016   HEPBSAB Reactive 12/19/2015   HEPBIGM Negative 12/19/2015      Microbiology:  No results found for this or any previous visit (from the past 240 hour(s)).  Coagulation Studies: No results for input(s): LABPROT, INR in the last 72 hours.  Urinalysis: No results for input(s): COLORURINE, LABSPEC, PHURINE, GLUCOSEU, HGBUR, BILIRUBINUR, KETONESUR, PROTEINUR, UROBILINOGEN, NITRITE, LEUKOCYTESUR in the last 72 hours.  Invalid input(s): APPERANCEUR    Imaging: No results found.   Medications:    . amLODipine  5 mg Oral Daily  . aspirin EC  81 mg Oral Daily  . calcium acetate  2,001 mg Oral TID WC  . carvedilol  25 mg Oral BID WC  . clopidogrel  75 mg Oral Q breakfast  .  furosemide  80 mg Oral BID  . gentamicin ointment   Topical Daily  . hydrALAZINE  50 mg Oral BID  . insulin aspart  0-9 Units Subcutaneous Q4H  . insulin glargine  12 Units Subcutaneous QHS  . levothyroxine  75 mcg Oral QAC breakfast  . lisinopril  5 mg Oral Daily  . metolazone  2.5 mg Oral Daily  . multivitamin  1 tablet Oral Daily  . pantoprazole (PROTONIX) IV  40 mg Intravenous Q12H  . sodium bicarbonate  1,300 mg Oral BID  . vitamin B-12  1,000 mcg Oral Daily   acetaminophen **OR** acetaminophen, albuterol, bisacodyl, heparin, ipratropium, magnesium citrate, ondansetron **OR** ondansetron (ZOFRAN) IV, oxyCODONE, senna-docusate  Assessment/ Plan:  66 y.o. female with end-stage renal disease, peritoneal dialysis, congestive heart failure, coronary disease, diabetes, hypertension, hyperlipidemia, hypothyroidism. PMD: Dr Ginette Pitman Dialysis: [address]: Dr Kolluru/ CCPD 5 x 1700 cc ; tidal  85%  1.  ESRD We will continue her CCPD.  Orders prepared  2. Severe anemia- Anemia of CKD and blood loss Guaiac positive.  GI evaluation in progress.  Patient received 2 units of blood transfusion Will order EPO for the patient SQ  3. Secondary hyperparathyroidism Continue home dose of binders- Calcium acetate    LOS: 1 Jaquita Bessire 7/10/20183:02 PM  [facility] [place], [place]

## 2017-01-30 NOTE — Care Management (Signed)
PD patient.  Elvera Bicker dialysis Liaison notified of admission.  Patient followed by [address].

## 2017-01-31 ENCOUNTER — Other Ambulatory Visit (INDEPENDENT_AMBULATORY_CARE_PROVIDER_SITE_OTHER): Payer: Self-pay | Admitting: Vascular Surgery

## 2017-01-31 DIAGNOSIS — N185 Chronic kidney disease, stage 5: Secondary | ICD-10-CM

## 2017-01-31 LAB — BPAM RBC
BLOOD PRODUCT EXPIRATION DATE: 201807202359
BLOOD PRODUCT EXPIRATION DATE: 201807202359
ISSUE DATE / TIME: 201807100234
ISSUE DATE / TIME: 201807092332
Unit Type and Rh: 6200
Unit Type and Rh: 6200

## 2017-01-31 LAB — HEPATITIS B SURFACE ANTIGEN: HEP B S AG: NEGATIVE

## 2017-01-31 LAB — BASIC METABOLIC PANEL
ANION GAP: 16 — AB (ref 5–15)
BUN: 93 mg/dL — ABNORMAL HIGH (ref 6–20)
CHLORIDE: 97 mmol/L — AB (ref 101–111)
CO2: 20 mmol/L — AB (ref 22–32)
Calcium: 7.7 mg/dL — ABNORMAL LOW (ref 8.9–10.3)
Creatinine, Ser: 14.85 mg/dL — ABNORMAL HIGH (ref 0.44–1.00)
GFR calc non Af Amer: 2 mL/min — ABNORMAL LOW (ref >60)
GFR, EST AFRICAN AMERICAN: 3 mL/min — AB (ref >60)
Glucose, Bld: 89 mg/dL (ref 65–99)
Potassium: 4.5 mmol/L (ref 3.5–5.1)
Sodium: 133 mmol/L — ABNORMAL LOW (ref 135–145)

## 2017-01-31 LAB — CBC
HEMATOCRIT: 24.9 % — AB (ref 35.0–47.0)
HEMOGLOBIN: 8.8 g/dL — AB (ref 12.0–16.0)
MCH: 31.3 pg (ref 26.0–34.0)
MCHC: 35.3 g/dL (ref 32.0–36.0)
MCV: 88.7 fL (ref 80.0–100.0)
Platelets: 152 10*3/uL (ref 150–440)
RBC: 2.81 MIL/uL — ABNORMAL LOW (ref 3.80–5.20)
RDW: 17.7 % — AB (ref 11.5–14.5)
WBC: 7 10*3/uL (ref 3.6–11.0)

## 2017-01-31 LAB — TYPE AND SCREEN
ABO/RH(D): A POS
ANTIBODY SCREEN: NEGATIVE
UNIT DIVISION: 0
Unit division: 0

## 2017-01-31 LAB — GLUCOSE, CAPILLARY
GLUCOSE-CAPILLARY: 86 mg/dL (ref 65–99)
GLUCOSE-CAPILLARY: 74 mg/dL (ref 65–99)

## 2017-01-31 LAB — HEPATITIS B SURFACE ANTIBODY,QUALITATIVE: Hep B S Ab: REACTIVE

## 2017-01-31 MED ORDER — PANTOPRAZOLE SODIUM 40 MG PO TBEC: 40.0000 mg | DELAYED_RELEASE_TABLET | Freq: Two times a day (BID) | ORAL | 0 refills | Status: AC

## 2017-01-31 NOTE — Progress Notes (Signed)
PD COMPLETED

## 2017-01-31 NOTE — Discharge Instructions (Signed)
Anemia, Nonspecific Anemia is a condition in which the concentration of red blood cells or hemoglobin in the blood is below normal. Hemoglobin is a substance in red blood cells that carries oxygen to the tissues of the body. Anemia results in not enough oxygen reaching these tissues. What are the causes? Common causes of anemia include:  Excessive bleeding. Bleeding may be internal or external. This includes excessive bleeding from periods (in women) or from the intestine.  Poor nutrition.  Chronic kidney, thyroid, and liver disease.  Bone marrow disorders that decrease red blood cell production.  Cancer and treatments for cancer.  HIV, AIDS, and their treatments.  Spleen problems that increase red blood cell destruction.  Blood disorders.  Excess destruction of red blood cells due to infection, medicines, and autoimmune disorders. What are the signs or symptoms?  Minor weakness.  Dizziness.  Headache.  Palpitations.  Shortness of breath, especially with exercise.  Paleness.  Cold sensitivity.  Indigestion.  Nausea.  Difficulty sleeping.  Difficulty concentrating. Symptoms may occur suddenly or they may develop slowly. How is this diagnosed? Additional blood tests are often needed. These help your health care provider determine the best treatment. Your health care provider will check your stool for blood and look for other causes of blood loss. How is this treated? Treatment varies depending on the cause of the anemia. Treatment can include:  Supplements of iron, vitamin B12, or folic acid.  Hormone medicines.  A blood transfusion. This may be needed if blood loss is severe.  Hospitalization. This may be needed if there is significant continual blood loss.  Dietary changes.  Spleen removal. Follow these instructions at home: Keep all follow-up appointments. It often takes many weeks to correct anemia, and having your health care provider check on your  condition and your response to treatment is very important. Get help right away if:  You develop extreme weakness, shortness of breath, or chest pain.  You become dizzy or have trouble concentrating.  You develop heavy vaginal bleeding.  You develop a rash.  You have bloody or black, tarry stools.  You faint.  You vomit up blood.  You vomit repeatedly.  You have abdominal pain.  You have a fever or persistent symptoms for more than 2-3 days.  You have a fever and your symptoms suddenly get worse.  You are dehydrated. This information is not intended to replace advice given to you by your health care provider. Make sure you discuss any questions you have with your health care provider. Document Released: 08/17/2004 Document Revised: 12/22/2015 Document Reviewed: 01/03/2013 Elsevier Interactive Patient Education  2017 [employer]vier Inc.  

## 2017-01-31 NOTE — Progress Notes (Signed)
Discharge instructions reviewed with the patient and her husband.  Patient being sent out via wheelchair to car

## 2017-01-31 NOTE — Care Management Important Message (Signed)
Important Message  Patient Details  Name: Kathaleen Dudziak MRN: [id number] Date of Birth: 1950/09/05   Medicare Important Message Given:  N/A - LOS <3 / Initial given by admissions    Katrina Stack, RN 01/31/2017, 9:45 AM

## 2017-01-31 NOTE — Care Management (Signed)
No discharge needs identified by members of the care team. Notified Bethany Lee with [employer] of discharge for the established peritoneal dialysis patient

## 2017-01-31 NOTE — Progress Notes (Signed)
[facility], [place] 01/31/17  Subjective:   Patient known to our practice from outpatient dialysis. When she was on vacation in [place], [place] she felt pain behind right ear and dizziness. The symptoms resolved after she took Tylenol.   She went to see her primary care.  The hemoglobin level done at [facility] office showed severe anemia.  It was confirmed in the emergency room where hemoglobin was 6.2.  She was given 2 units of blood transfusion.  Today, she feels well.  No nausea or vomiting reported.  No frank blood in the stool. Tolerated feeding well overnight. Hemoglobin slightly low at 8.8  Objective:  Vital signs in last 24 hours:  Temp:  [98.2 F (36.8 C)] 98.2 F (36.8 C) (07/11 0435) Pulse Rate:  [72-79] 75 (07/11 0915) Resp:  [17] 17 (07/11 0435) BP: (121-158)/(36-62) 156/51 (07/11 0915) SpO2:  [97 %-99 %] 97 % (07/11 0435) Weight:  [67.3 kg (148 lb 6.4 oz)] 67.3 kg (148 lb 6.4 oz) (07/11 0500)  Weight change: 3.039 kg (6 lb 11.2 oz) Filed Weights   01/30/17 0537 01/30/17 1133 01/31/17 0500  Weight: 66.8 kg (147 lb 3.2 oz) 66.5 kg (146 lb 11.2 oz) 67.3 kg (148 lb 6.4 oz)    Intake/Output:    Intake/Output Summary (Last 24 hours) at 01/31/17 1739 Last data filed at 01/31/17 0936  Gross per 24 hour  Intake              420 ml  Output             1009 ml  Net             -589 ml     Physical Exam: General: No acute distress, sitting in the bed  HEENT Anicteric, moist oral mucous membranes  Neck supple  Pulm/lungs Clear to auscultation  CVS/Heart irregular rhythm, no rub or gallop  Abdomen:  Soft, nontender  Extremities: No edema  Neurologic: Alert, oriented, and able to answer questions  Skin: No acute rashes  Access: PD catheter, left upper extremity developing AV fistula       Basic Metabolic Panel:   Recent Labs Lab 01/29/17 1738 01/30/17 0633 01/31/17 0408  NA 137 136 133*  K 4.8 5.1 4.5  CL 97* 100* 97*  CO2 23 20*  20*  GLUCOSE 136* 72 89  BUN 93* 99* 93*  CREATININE 14.64* 15.48* 14.85*  CALCIUM 8.2* 7.6* 7.7*     CBC:  Recent Labs Lab 01/29/17 1738 01/29/17 2230 01/30/17 0633 01/30/17 1038 01/30/17 1814 01/31/17 0408  WBC 6.9 6.6 7.7 8.2  --  7.0  HGB 6.3* 6.2* 9.1* 9.5* 9.1* 8.8*  HCT 18.5* 18.2* 25.9* 27.2*  --  24.9*  MCV 93.4 93.7 88.8 88.0  --  88.7  PLT 185 168 161 159  --  152      Lab Results  Component Value Date   HEPBSAG Negative 01/30/2017   HEPBSAB Reactive 01/30/2017   HEPBIGM Negative 12/19/2015      Microbiology:  No results found for this or any previous visit (from the past 240 hour(s)).  Coagulation Studies: No results for input(s): LABPROT, INR in the last 72 hours.  Urinalysis: No results for input(s): COLORURINE, LABSPEC, PHURINE, GLUCOSEU, HGBUR, BILIRUBINUR, KETONESUR, PROTEINUR, UROBILINOGEN, NITRITE, LEUKOCYTESUR in the last 72 hours.  Invalid input(s): APPERANCEUR    Imaging: No results found.   Medications:       Assessment/ Plan:  66 y.o. female with end-stage  renal disease, peritoneal dialysis, congestive heart failure, coronary disease, diabetes, hypertension, hyperlipidemia, hypothyroidism. PMD: Dr Ginette Pitman Dialysis: [address]: Dr Kolluru/ CCPD 5 x 1700 cc ; tidal 85%  1.  ESRD We will continue her CCPD as outpatient as patient is expected to be discharged today  2. Severe anemia- Anemia of CKD and blood loss Guiac positive.  GI evaluation in progress.  Patient received 2 units of blood transfusion Script given for stat hgb check on Friday  3. Secondary hyperparathyroidism Continue home dose of binders- Calcium acetate    LOS: 2 [facility] 7/11/20185:39 PM  [facility] [place], [place]

## 2017-02-01 ENCOUNTER — Ambulatory Visit (INDEPENDENT_AMBULATORY_CARE_PROVIDER_SITE_OTHER): Payer: Medicare HMO

## 2017-02-01 ENCOUNTER — Encounter (INDEPENDENT_AMBULATORY_CARE_PROVIDER_SITE_OTHER): Payer: Self-pay

## 2017-02-01 ENCOUNTER — Encounter (INDEPENDENT_AMBULATORY_CARE_PROVIDER_SITE_OTHER): Payer: Self-pay | Admitting: Vascular Surgery

## 2017-02-01 ENCOUNTER — Ambulatory Visit (INDEPENDENT_AMBULATORY_CARE_PROVIDER_SITE_OTHER): Payer: Medicare HMO | Admitting: Vascular Surgery

## 2017-02-01 ENCOUNTER — Telehealth (INDEPENDENT_AMBULATORY_CARE_PROVIDER_SITE_OTHER): Payer: Self-pay

## 2017-02-01 VITALS — BP 146/66 | HR 81 | Resp 16 | Wt 148.0 lb

## 2017-02-01 DIAGNOSIS — N186 End stage renal disease: Secondary | ICD-10-CM

## 2017-02-01 DIAGNOSIS — Z992 Dependence on renal dialysis: Secondary | ICD-10-CM | POA: Diagnosis not present

## 2017-02-01 DIAGNOSIS — I25119 Atherosclerotic heart disease of native coronary artery with unspecified angina pectoris: Secondary | ICD-10-CM

## 2017-02-01 DIAGNOSIS — T82898S Other specified complication of vascular prosthetic devices, implants and grafts, sequela: Secondary | ICD-10-CM

## 2017-02-01 DIAGNOSIS — E118 Type 2 diabetes mellitus with unspecified complications: Secondary | ICD-10-CM

## 2017-02-01 DIAGNOSIS — I10 Essential (primary) hypertension: Secondary | ICD-10-CM

## 2017-02-01 DIAGNOSIS — N185 Chronic kidney disease, stage 5: Secondary | ICD-10-CM

## 2017-02-01 NOTE — Telephone Encounter (Signed)
I have attempted to contact the patient to let her know about her upcoming surgery and pre-op. Her pre-op is 02/06/17 @12 :30 pm and her surgery is 02/07/17. I am unable to leave a message. I did call twice.

## 2017-02-01 NOTE — Discharge Summary (Signed)
[place] [facility] [place] NAME: Bethany Lee    MR#:  [id number]  DATE OF BIRTH:  1951-04-05  DATE OF ADMISSION:  01/29/2017   ADMITTING PHYSICIAN: Harvie Bridge, DO  DATE OF DISCHARGE: 01/31/2017  1:07 PM  PRIMARY CARE PHYSICIAN: Tracie Harrier, MD   ADMISSION DIAGNOSIS:  Symptomatic anemia [D64.9] Gastrointestinal hemorrhage, unspecified gastrointestinal hemorrhage type [K92.2] DISCHARGE DIAGNOSIS:  Active Problems:   GI bleeding  SECONDARY DIAGNOSIS:   Past Medical History:  Diagnosis Date  . Anemia   . CHF (congestive heart failure) ([place])   . Chronic kidney disease    peritoneal dialysis  . Complication of anesthesia   . Coronary artery disease   . Diabetes mellitus without complication ([place])   . Dialysis patient [facility])    Peritoneal dialysis patient  . Dyspnea    doe  . Edema   . Hypercholesterolemia   . Hypertension   . Hypothyroidism   . Neuropathy   . PONV (postoperative nausea and vomiting)   . Shingles    October-November 2017  . Tremors of nervous system    intermittent when taking gabapentin   HOSPITAL COURSE:  This is a 66 y.o.femalewith a history of CHF, CAD, DM, ESRD on PD, HTN, HLD, hypothyroidadmitted with:  #. GI Bleed - PPI BID, No further bleed while in the hospital - Since she is on Plavix she cannot have an endoscopy atleast for 4-5 days off it especially since she has ESRD and would have qualitative platelet dysfunction. As she has no obvious overt bleeding , GI recommended EGD+colonoscopy as an out patient. - tolerated diet  DISCHARGE CONDITIONS:  stable CONSULTS OBTAINED:  Treatment Team:  [name], MD Murlean Iba, MD DRUG ALLERGIES:   Allergies  Allergen Reactions  . Penicillins     Childhood loss of consciousness  Has patient had a PCN reaction causing immediate rash, facial/tongue/throat swelling, SOB or lightheadedness with hypotension: Yes Has patient had a PCN  reaction causing severe rash involving mucus membranes or skin necrosis: No Has patient had a PCN reaction that required hospitalization Unknown Has patient had a PCN reaction occurring within the last 10 years: Unknown If all of the above answers are "NO", then may proceed with Cephalosporin use.   . Valacyclovir Nausea Only   DISCHARGE MEDICATIONS:   Allergies as of 01/31/2017      Reactions   Penicillins    Childhood loss of consciousness  Has patient had a PCN reaction causing immediate rash, facial/tongue/throat swelling, SOB or lightheadedness with hypotension: Yes Has patient had a PCN reaction causing severe rash involving mucus membranes or skin necrosis: No Has patient had a PCN reaction that required hospitalization Unknown Has patient had a PCN reaction occurring within the last 10 years: Unknown If all of the above answers are "NO", then may proceed with Cephalosporin use.   Valacyclovir Nausea Only      Medication List    STOP taking these medications   lisinopril 10 MG tablet Commonly known as:  PRINIVIL,ZESTRIL     TAKE these medications   amLODipine 5 MG tablet Commonly known as:  NORVASC Take 5 mg by mouth daily.   aspirin EC 81 MG tablet Take 1 tablet (81 mg total) by mouth daily. Start taking after NOV 15 What changed:  additional instructions   calcium acetate 667 MG capsule Commonly known as:  PHOSLO Take 2,001 mg by mouth 3 (three) times daily with meals.   carvedilol 12.5  MG tablet Commonly known as:  COREG Take 1 tablet (12.5 mg total) by mouth 2 (two) times daily with a meal. What changed:  how much to take   clopidogrel 75 MG tablet Commonly known as:  PLAVIX Take 1 tablet (75 mg total) by mouth daily with breakfast.   DIALYVITE 800/ULTRA D Tabs Take 1 tablet by mouth daily.   furosemide 80 MG tablet Commonly known as:  LASIX Take 80 mg by mouth 2 (two) times daily.   gentamicin cream 0.1 % Commonly known as:  GARAMYCIN Apply 1  application topically daily. What changed:  additional instructions   hydrALAZINE 50 MG tablet Commonly known as:  APRESOLINE Take 50 mg by mouth 2 (two) times daily.   HYDROcodone-acetaminophen 5-325 MG tablet Commonly known as:  NORCO Take 1 tablet by mouth every 6 (six) hours as needed for moderate pain.   Insulin Glargine 300 UNIT/ML Sopn Commonly known as:  TOUJEO SOLOSTAR Inject 20 Units into the skin at bedtime. What changed:  how much to take   levothyroxine 75 MCG tablet Commonly known as:  SYNTHROID, LEVOTHROID Take 75 mcg by mouth daily before breakfast.   metolazone 5 MG tablet Commonly known as:  ZAROXOLYN Take 2.5 mg by mouth daily.   NOVOLOG FLEXPEN 100 UNIT/ML FlexPen Generic drug:  insulin aspart Inject 4-8 Units into the skin 3 (three) times daily with meals. Sliding scale below 200 take 4 units, 200-250=6 units, >250= 8 units   pantoprazole 40 MG tablet Commonly known as:  PROTONIX Take 1 tablet (40 mg total) by mouth 2 (two) times daily.   sodium bicarbonate 650 MG tablet Take 1,300 mg by mouth 2 (two) times daily.   vitamin B-12 1000 MCG tablet Commonly known as:  CYANOCOBALAMIN Take 1,000 mcg by mouth daily.        DISCHARGE INSTRUCTIONS:   DIET:  Regular diet DISCHARGE CONDITION:  Good ACTIVITY:  Activity as tolerated OXYGEN:  Home Oxygen: No.  Oxygen Delivery: room air DISCHARGE LOCATION:  home   If you experience worsening of your admission symptoms, develop shortness of breath, life threatening emergency, suicidal or homicidal thoughts you must seek medical attention immediately by calling 911 or calling your MD immediately  if symptoms less severe.  You Must read complete instructions/literature along with all the possible adverse reactions/side effects for all the Medicines you take and that have been prescribed to you. Take any new Medicines after you have completely understood and accpet all the possible adverse reactions/side  effects.   Please note  You were cared for by a hospitalist during your hospital stay. If you have any questions about your discharge medications or the care you received while you were in the hospital after you are discharged, you can call the unit and asked to speak with the hospitalist on call if the hospitalist that took care of you is not available. Once you are discharged, your primary care physician will handle any further medical issues. Please note that NO REFILLS for any discharge medications will be authorized once you are discharged, as it is imperative that you return to your primary care physician (or establish a relationship with a primary care physician if you do not have one) for your aftercare needs so that they can reassess your need for medications and monitor your lab values.    On the day of Discharge:  VITAL SIGNS:  Blood pressure (!) 156/51, pulse 75, temperature 98.2 F (36.8 C), temperature source Oral, resp. rate 17,  height 5' (1.524 m), weight 67.3 kg (148 lb 6.4 oz), SpO2 97 %. PHYSICAL EXAMINATION:  GENERAL:  66 y.o.-year-old patient lying in the bed with no acute distress.  EYES: Pupils equal, round, reactive to light and accommodation. No scleral icterus. Extraocular muscles intact.  HEENT: Head atraumatic, normocephalic. Oropharynx and nasopharynx clear.  NECK:  Supple, no jugular venous distention. No thyroid enlargement, no tenderness.  LUNGS: Normal breath sounds bilaterally, no wheezing, rales,rhonchi or crepitation. No use of accessory muscles of respiration.  CARDIOVASCULAR: S1, S2 normal. No murmurs, rubs, or gallops.  ABDOMEN: Soft, non-tender, non-distended. Bowel sounds present. No organomegaly or mass.  EXTREMITIES: No pedal edema, cyanosis, or clubbing.  NEUROLOGIC: Cranial nerves II through XII are intact. Muscle strength 5/5 in all extremities. Sensation intact. Gait not checked.  PSYCHIATRIC: The patient is alert and oriented x 3.  SKIN: No  obvious rash, lesion, or ulcer.  DATA REVIEW:   CBC  Recent Labs Lab 01/31/17 0408  WBC 7.0  HGB 8.8*  HCT 24.9*  PLT 152    Chemistries   Recent Labs Lab 01/29/17 1738  01/31/17 0408  NA 137  < > 133*  K 4.8  < > 4.5  CL 97*  < > 97*  CO2 23  < > 20*  GLUCOSE 136*  < > 89  BUN 93*  < > 93*  CREATININE 14.64*  < > 14.85*  CALCIUM 8.2*  < > 7.7*  AST 14*  --   --   ALT 14  --   --   ALKPHOS 43  --   --   BILITOT 0.7  --   --   < > = values in this interval not displayed.  Follow-up Information    Tracie Harrier, MD. Go on 02/08/2017.   Specialty:  Internal Medicine Why:  @2 :45pm Contact information: [place] [address] [phone]        [name], MD. Go on 03/05/2017.   Specialty:  Surgery Why:  @3 :30pm WITH DR. VANGA Contact information: [place] [address] [phone]           Management plans discussed with the patient, family and they are in agreement.  CODE STATUS: Prior   TOTAL TIME TAKING CARE OF THIS PATIENT: 45 minutes.    Max Sane M.D on 02/01/2017 at 10:49 PM  Between 7am to 6pm - Pager - [phone]  After 6pm go to [email or web address] - Proofreader  [facility]  Office  [phone]  CC: Primary care physician; Tracie Harrier, MD   Note: This dictation was prepared with Dragon dictation along with smaller phrase technology. Any transcriptional errors that result from this process are unintentional.

## 2017-02-02 ENCOUNTER — Other Ambulatory Visit
Admission: RE | Admit: 2017-02-02 | Discharge: 2017-02-02 | Disposition: A | Discharge status: Home / Self Care | Payer: Medicare HMO | Source: Ambulatory Visit | Attending: Nephrology | Admitting: Nephrology

## 2017-02-02 DIAGNOSIS — T82898A Other specified complication of vascular prosthetic devices, implants and grafts, initial encounter: Secondary | ICD-10-CM | POA: Insufficient documentation

## 2017-02-02 DIAGNOSIS — I10 Essential (primary) hypertension: Secondary | ICD-10-CM | POA: Insufficient documentation

## 2017-02-02 DIAGNOSIS — E118 Type 2 diabetes mellitus with unspecified complications: Secondary | ICD-10-CM | POA: Diagnosis not present

## 2017-02-02 DIAGNOSIS — N186 End stage renal disease: Secondary | ICD-10-CM | POA: Diagnosis not present

## 2017-02-02 DIAGNOSIS — Z992 Dependence on renal dialysis: Secondary | ICD-10-CM | POA: Diagnosis not present

## 2017-02-02 LAB — HEMOGLOBIN: Hemoglobin: 8.9 g/dL — ABNORMAL LOW (ref 12.0–16.0)

## 2017-02-02 NOTE — Progress Notes (Signed)
MRN : [id number]  Bethany Lee is a 66 y.o. (08-14-1950) female who presents with chief complaint of  Chief Complaint  Patient presents with  . Routine Post Op  .  History of Present Illness: The patient returns to the office for followup status post intervention of the left arm arteria and the dialysis access 01/19/2017.  Procedure(s) Performed: 1. Ultrasound guidance for vascular access right femoral artery  2. Catheter placement into left brachial artery from right femoral approach 3. Arch Aortogram and selective angiogram of the left arm third order catheter placement             4.  Percutaneous transluminal angioplasty of the left brachial artery with a 5 mm diameter x 40 mm length Lutonix angioplasty balloon 5. Stent to the left subclavian with 7 mm x 26 mm balloon expandable stent and an 8 mm x,16 mm Lifestream stents post dilation using an 9 mm balloon 6. Percutaneous transluminal angioplasty of the left brachiocephalic fistula, peripheral section, 27 mm with a Lutonix drug-eluting balloon              7.  StarClose closure device right femoral artery   Following the intervention the hand pain was unchanged per the patient.   The patient denies an increase in arm swelling. At the present time the patient denies hand pain.  The patient denies amaurosis fugax or recent TIA symptoms. There are no recent neurological changes noted. The patient denies claudication symptoms or rest pain symptoms. The patient denies history of DVT, PE or superficial thrombophlebitis. The patient denies recent episodes of angina or shortness of breath.   Duplex ultrasound of the AV access shows a patent access with uniform velocities.  No focal hemodynamically significant stenosis. Of note Volume Flow has increased from 773 to 1708 cc/min.   Current Meds  Medication Sig  . amLODipine (NORVASC) 5 MG tablet Take 5 mg by mouth  daily.  Marland Kitchen aspirin EC 81 MG tablet Take 1 tablet (81 mg total) by mouth daily. Start taking after NOV 15 (Patient taking differently: Take 81 mg by mouth daily. )  . calcium acetate (PHOSLO) 667 MG capsule Take 2,001 mg by mouth 3 (three) times daily with meals.  . carvedilol (COREG) 12.5 MG tablet Take 1 tablet (12.5 mg total) by mouth 2 (two) times daily with a meal. (Patient taking differently: Take 25 mg by mouth 2 (two) times daily with a meal. )  . clopidogrel (PLAVIX) 75 MG tablet Take 1 tablet (75 mg total) by mouth daily with breakfast.  . furosemide (LASIX) 80 MG tablet Take 80 mg by mouth 2 (two) times daily.  Marland Kitchen gentamicin cream (GARAMYCIN) 0.1 % Apply 1 application topically daily. (Patient taking differently: Apply 1 application topically daily. Catheter dressing changes)  . hydrALAZINE (APRESOLINE) 50 MG tablet Take 50 mg by mouth 2 (two) times daily.  . insulin aspart (NOVOLOG FLEXPEN) 100 UNIT/ML FlexPen Inject 4-8 Units into the skin 3 (three) times daily with meals. Sliding scale below 200 take 4 units, 200-250=6 units, >250= 8 units  . Insulin Glargine (TOUJEO SOLOSTAR) 300 UNIT/ML SOPN Inject 20 Units into the skin at bedtime. (Patient taking differently: Inject 24 Units into the skin at bedtime. )  . levothyroxine (SYNTHROID, LEVOTHROID) 75 MCG tablet Take 75 mcg by mouth daily before breakfast.  . metolazone (ZAROXOLYN) 5 MG tablet Take 2.5 mg by mouth daily.   . Multiple Vitamins-Minerals (DIALYVITE 800/ULTRA D) TABS Take 1 tablet by  mouth daily.  . pantoprazole (PROTONIX) 40 MG tablet Take 1 tablet (40 mg total) by mouth 2 (two) times daily.  . sodium bicarbonate 650 MG tablet Take 1,300 mg by mouth 2 (two) times daily.  . vitamin B-12 (CYANOCOBALAMIN) 1000 MCG tablet Take 1,000 mcg by mouth daily.    Past Medical History:  Diagnosis Date  . Anemia   . CHF (congestive heart failure) ([place])   . Chronic kidney disease    peritoneal dialysis  . Complication of anesthesia     . Coronary artery disease   . Diabetes mellitus without complication ([place])   . Dialysis patient [facility])    Peritoneal dialysis patient  . Dyspnea    doe  . Edema   . Hypercholesterolemia   . Hypertension   . Hypothyroidism   . Neuropathy   . PONV (postoperative nausea and vomiting)   . Shingles    October-November 2017  . Tremors of nervous system    intermittent when taking gabapentin    Past Surgical History:  Procedure Laterality Date  . A/V SHUNT INTERVENTION N/A 01/16/2017   Procedure: A/V Shunt Intervention;  Surgeon: Katha Cabal, MD;  Location: [place] [facility];  Service: Cardiovascular;  Laterality: N/A;  . A/V SHUNTOGRAM Left 01/16/2017   Procedure: A/V Fistulagram;  Surgeon: Katha Cabal, MD;  Location: [place] [facility];  Service: Cardiovascular;  Laterality: Left;  . ABDOMINAL HYSTERECTOMY    . AV FISTULA PLACEMENT Left 11/30/2016   Procedure: ARTERIOVENOUS (AV) FISTULA CREATION ( EXPLORE FOR CREATION BRACHIOCEPHALIC);  Surgeon: Algernon Huxley, MD;  Location: [facility];  Service: Vascular;  Laterality: Left;  . CAPD INSERTION N/A 11/25/2015   Procedure: LAPAROSCOPIC INSERTION CONTINUOUS AMBULATORY PERITONEAL DIALYSIS  (CAPD) CATHETER;  Surgeon: Algernon Huxley, MD;  Location: [facility];  Service: Vascular;  Laterality: N/A;  . CAPD INSERTION N/A 01/06/2016   Procedure: LAPAROSCOPIC INSERTION CONTINUOUS AMBULATORY PERITONEAL DIALYSIS  (CAPD) CATHETER REVISION ;  Surgeon: Algernon Huxley, MD;  Location: [facility];  Service: Vascular;  Laterality: N/A;  . CAPD INSERTION N/A 10/11/2016   Procedure: LAPAROSCOPIC INSERTION CONTINUOUS AMBULATORY PERITONEAL DIALYSIS  (CAPD) CATHETER ( REVISION );  Surgeon: Algernon Huxley, MD;  Location: [facility];  Service: Vascular;  Laterality: N/A;  . CARDIAC CATHETERIZATION    . CATARACT EXTRACTION W/PHACO Right 05/25/2016   Procedure: CATARACT EXTRACTION PHACO AND INTRAOCULAR LENS PLACEMENT (IOC);  Surgeon: Eulogio Bear, MD;  Location:  [facility]S;  Service: Ophthalmology;  Laterality: Right;  Lot # [id number] H [place]: 00:5.2 AP%: 11.3 CDE: 7.21  . CORONARY ANGIOPLASTY    . CORONARY STENT INTERVENTION N/A 09/07/2016   Procedure: Coronary Stent Intervention;  Surgeon: Isaias Cowman, MD;  Location: [place] [facility];  Service: Cardiovascular;  Laterality: N/A;  . DIALYSIS/PERMA CATHETER INSERTION N/A 09/21/2016   Procedure: Dialysis/Perma Catheter Insertion;  Surgeon: Algernon Huxley, MD;  Location: [place] [facility];  Service: Cardiovascular;  Laterality: N/A;  . DIALYSIS/PERMA CATHETER REMOVAL Right 11/30/2016   Procedure: DIALYSIS/PERMA CATHETER REMOVAL;  Surgeon: Algernon Huxley, MD;  Location: [facility];  Service: Vascular;  Laterality: Right;  . EYE SURGERY    . LEFT HEART CATH AND CORONARY ANGIOGRAPHY Left 09/07/2016   Procedure: Left Heart Cath and Coronary Angiography;  Surgeon: Isaias Cowman, MD;  Location: [place] [facility];  Service: Cardiovascular;  Laterality: Left;  . TUBAL LIGATION    . UPPER EXTREMITY ANGIOGRAPHY Left 01/19/2017   Procedure: Upper Extremity Angiography;  Surgeon: Delana Meyer,  Dolores Lory, MD;  Location: [place] [facility];  Service: Cardiovascular;  Laterality: Left;    Social History Social History  Substance Use Topics  . Smoking status: Former Smoker    Quit date: 10/09/2008  . Smokeless tobacco: Never Used  . Alcohol use No    Family History No family history on file.  Allergies  Allergen Reactions  . Penicillins     Childhood loss of consciousness  Has patient had a PCN reaction causing immediate rash, facial/tongue/throat swelling, SOB or lightheadedness with hypotension: Yes Has patient had a PCN reaction causing severe rash involving mucus membranes or skin necrosis: No Has patient had a PCN reaction that required hospitalization Unknown Has patient had a PCN reaction occurring within the last 10 years: Unknown If all of the above answers are "NO", then may proceed with  Cephalosporin use.   . Valacyclovir Nausea Only     REVIEW OF SYSTEMS (Negative unless checked)  Constitutional: [] Weight loss  [] Fever  [] Chills Cardiac: [] Chest pain   [] Chest pressure   [] Palpitations   [] Shortness of breath when laying flat   [] Shortness of breath with exertion. Vascular:  [] Pain in legs with walking   [] Pain in legs at rest  [] History of DVT   [] Phlebitis   [] Swelling in legs   [] Varicose veins   [] Non-healing ulcers Pulmonary:   [] Uses home oxygen   [] Productive cough   [] Hemoptysis   [] Wheeze  [] COPD   [] Asthma Neurologic:  [] Dizziness   [] Seizures   [] History of stroke   [] History of TIA  [] Aphasia   [] Vissual changes   [] Weakness or numbness in arm   [] Weakness or numbness in leg Musculoskeletal:   [] Joint swelling   [] Joint pain   [] Low back pain Hematologic:  [] Easy bruising  [] Easy bleeding   [] Hypercoagulable state   [] Anemic Gastrointestinal:  [] Diarrhea   [] Vomiting  [] Gastroesophageal reflux/heartburn   [] Difficulty swallowing. Genitourinary:  [x] Chronic kidney disease   [] Difficult urination  [] Frequent urination   [] Blood in urine Skin:  [] Rashes   [] Ulcers  Psychological:  [] History of anxiety   []  History of major depression.  Physical Examination  Vitals:   02/01/17 1159  BP: (!) 146/66  Pulse: 81  Resp: 16  Weight: 67.1 kg (148 lb)   Body mass index is 28.9 kg/m. Gen: WD/WN, NAD Head: [place]/AT, No temporalis wasting.  Ear/Nose/Throat: Hearing grossly intact, nares w/o erythema or drainage Eyes: PER, EOMI, sclera nonicteric.  Neck: Supple, no large masses.   Pulmonary:  Good air movement, no audible wheezing bilaterally, no use of accessory muscles.  Cardiac: RRR, no JVD Vascular: Left hand is cool to touch with delayed capillary refill Vessel Right Left  Radial Palpable Not Palpable  Ulnar Palpable Not Palpable  Brachial Palpable Palpable  Gastrointestinal: Non-distended. No guarding/no peritoneal signs.  Musculoskeletal: M/S 5/5  throughout.  No deformity or atrophy.  Neurologic: CN 2-12 intact. Symmetrical.  Speech is fluent. Motor exam as listed above. Psychiatric: Judgment intact, Mood & affect appropriate for pt's clinical situation. Dermatologic: No rashes or ulcers noted.  No changes consistent with cellulitis. Lymph : No lichenification or skin changes of chronic lymphedema.  CBC Lab Results  Component Value Date   WBC 7.0 01/31/2017   HGB 8.9 (L) 02/02/2017   HCT 24.9 (L) 01/31/2017   MCV 88.7 01/31/2017   PLT 152 01/31/2017    BMET    Component Value Date/Time   NA 133 (L) 01/31/2017 0408   K 4.5 01/31/2017 0408  K 4.8 01/06/2016 1416   CL 97 (L) 01/31/2017 0408   CO2 20 (L) 01/31/2017 0408   GLUCOSE 89 01/31/2017 0408   BUN 93 (H) 01/31/2017 0408   CREATININE 14.85 (H) 01/31/2017 0408   CALCIUM 7.7 (L) 01/31/2017 0408   GFRNONAA 2 (L) 01/31/2017 0408   GFRAA 3 (L) 01/31/2017 0408   Estimated Creatinine Clearance: 3.1 mL/min (A) (by C-G formula based on SCr of 14.85 mg/dL (H)).  COAG Lab Results  Component Value Date   INR 0.97 11/27/2016   INR 0.92 10/09/2016   INR 0.95 07/16/2016    Radiology No results found.   Assessment/Plan 1. Steal syndrome as complication of dialysis access, sequela Recommend:  At this time the patient does not have appropriate extremity access for dialysisAnd she is continuing to have significant pain consistent with steal syndrome. Supporting this is the fact that after her intervention with significant improvement in inflow arterially her volume flow within the fistula has increased from approximately [phone] cc/m  Patient should have a banding of her left brachiocephalic fistula.  The risks, benefits and alternative therapies were reviewed in detail with the patient.  All questions were answered.  The patient agrees to proceed with surgery. Of note the possibility of loss of the fistula secondary to bandaging was also discussed. But this was brought  up as one of the potential options for treatment at this time as well in other words 1 option is to just ligate the fistula and resolve the steal syndrome immediately. She has given the strong consideration but wishes to attempt a banding.   2. ESRD on dialysis [facility]) Continue peritoneal dialysis without difficulty  3. Type 2 diabetes mellitus with complication, unspecified whether long term insulin use (HCC) Continue hypoglycemic medications as already ordered, these medications have been reviewed and there are no changes at this time.  Hgb A1C to be monitored as already arranged by primary service   4. Coronary artery disease involving native coronary artery of native heart with angina pectoris ([place]) Continue cardiac and antihypertensive medications as already ordered and reviewed, no changes at this time.  Continue statin as ordered and reviewed, no changes at this time  Nitrates PRN for chest pain   5. Essential hypertension Continue antihypertensive medications as already ordered, these medications have been reviewed and there are no changes at this time.     Hortencia Pilar, MD  02/02/2017 5:00 PM

## 2017-02-03 DIAGNOSIS — N186 End stage renal disease: Secondary | ICD-10-CM | POA: Diagnosis not present

## 2017-02-03 DIAGNOSIS — Z992 Dependence on renal dialysis: Secondary | ICD-10-CM | POA: Diagnosis not present

## 2017-02-04 DIAGNOSIS — Z992 Dependence on renal dialysis: Secondary | ICD-10-CM | POA: Diagnosis not present

## 2017-02-04 DIAGNOSIS — N186 End stage renal disease: Secondary | ICD-10-CM | POA: Diagnosis not present

## 2017-02-05 ENCOUNTER — Other Ambulatory Visit: Payer: Self-pay

## 2017-02-05 DIAGNOSIS — N186 End stage renal disease: Secondary | ICD-10-CM | POA: Diagnosis not present

## 2017-02-05 DIAGNOSIS — Z992 Dependence on renal dialysis: Secondary | ICD-10-CM | POA: Diagnosis not present

## 2017-02-05 NOTE — Patient Outreach (Signed)
[place] [name]) Care Management  02/05/2017  Josslyn Ciolek 05-Feb-1951 [id number]   RED ON EMMI ALERT Day #  1 Date: 02/02/17 Red Alert Reason: Know who to call about changes in condition? No    Outreach attempt # 1 Successful  Spoke with spouse/DPR [employer].  Explained reason for the call.  He is able to verify HIPAA.  He states they really were not sure of who to call especially on weekends. Discussed with spouse calling primary care physician even on weekends as they do have physicians on call who can guide them as where to seek help.  Also discussed with him in emergent situations such as bleeding as his wife had going to the emergency room would be appropriate.  He verbalized understanding. Explained to him [facility] services and offered services but he declined at this time.  He is agreeable to receive letter and brochure about [facility] for future reference.     Plan: RN CM will send letter, brochure, know before you go information. RN CM will notify care management assistant of status.    Jone Baseman, RN, MSN [place] [phone]

## 2017-02-06 ENCOUNTER — Encounter
Admission: RE | Admit: 2017-02-06 | Discharge: 2017-02-06 | Disposition: A | Discharge status: Home / Self Care | Payer: Medicare HMO | Source: Ambulatory Visit | Attending: Vascular Surgery | Admitting: Vascular Surgery

## 2017-02-06 ENCOUNTER — Other Ambulatory Visit (INDEPENDENT_AMBULATORY_CARE_PROVIDER_SITE_OTHER): Payer: Self-pay

## 2017-02-06 DIAGNOSIS — I10 Essential (primary) hypertension: Secondary | ICD-10-CM | POA: Diagnosis not present

## 2017-02-06 DIAGNOSIS — N186 End stage renal disease: Secondary | ICD-10-CM | POA: Diagnosis present

## 2017-02-06 DIAGNOSIS — K640 First degree hemorrhoids: Secondary | ICD-10-CM | POA: Diagnosis present

## 2017-02-06 DIAGNOSIS — E119 Type 2 diabetes mellitus without complications: Secondary | ICD-10-CM | POA: Diagnosis not present

## 2017-02-06 DIAGNOSIS — G63 Polyneuropathy in diseases classified elsewhere: Secondary | ICD-10-CM | POA: Diagnosis not present

## 2017-02-06 DIAGNOSIS — Z7902 Long term (current) use of antithrombotics/antiplatelets: Secondary | ICD-10-CM | POA: Diagnosis not present

## 2017-02-06 DIAGNOSIS — I5022 Chronic systolic (congestive) heart failure: Secondary | ICD-10-CM | POA: Diagnosis present

## 2017-02-06 DIAGNOSIS — K921 Melena: Secondary | ICD-10-CM | POA: Diagnosis present

## 2017-02-06 DIAGNOSIS — I120 Hypertensive chronic kidney disease with stage 5 chronic kidney disease or end stage renal disease: Secondary | ICD-10-CM | POA: Diagnosis not present

## 2017-02-06 DIAGNOSIS — K2980 Duodenitis without bleeding: Secondary | ICD-10-CM | POA: Diagnosis not present

## 2017-02-06 DIAGNOSIS — Z79899 Other long term (current) drug therapy: Secondary | ICD-10-CM | POA: Diagnosis not present

## 2017-02-06 DIAGNOSIS — Z87891 Personal history of nicotine dependence: Secondary | ICD-10-CM | POA: Diagnosis not present

## 2017-02-06 DIAGNOSIS — Z794 Long term (current) use of insulin: Secondary | ICD-10-CM | POA: Diagnosis not present

## 2017-02-06 DIAGNOSIS — Z880 Allergy status to penicillin: Secondary | ICD-10-CM | POA: Diagnosis not present

## 2017-02-06 DIAGNOSIS — Z9841 Cataract extraction status, right eye: Secondary | ICD-10-CM | POA: Diagnosis not present

## 2017-02-06 DIAGNOSIS — I2510 Atherosclerotic heart disease of native coronary artery without angina pectoris: Secondary | ICD-10-CM | POA: Diagnosis present

## 2017-02-06 DIAGNOSIS — E1122 Type 2 diabetes mellitus with diabetic chronic kidney disease: Secondary | ICD-10-CM | POA: Diagnosis present

## 2017-02-06 DIAGNOSIS — E7800 Pure hypercholesterolemia, unspecified: Secondary | ICD-10-CM | POA: Diagnosis present

## 2017-02-06 DIAGNOSIS — Z955 Presence of coronary angioplasty implant and graft: Secondary | ICD-10-CM | POA: Diagnosis not present

## 2017-02-06 DIAGNOSIS — K649 Unspecified hemorrhoids: Secondary | ICD-10-CM | POA: Diagnosis not present

## 2017-02-06 DIAGNOSIS — I132 Hypertensive heart and chronic kidney disease with heart failure and with stage 5 chronic kidney disease, or end stage renal disease: Secondary | ICD-10-CM | POA: Diagnosis present

## 2017-02-06 DIAGNOSIS — Z961 Presence of intraocular lens: Secondary | ICD-10-CM | POA: Diagnosis present

## 2017-02-06 DIAGNOSIS — R7989 Other specified abnormal findings of blood chemistry: Secondary | ICD-10-CM | POA: Diagnosis not present

## 2017-02-06 DIAGNOSIS — E1140 Type 2 diabetes mellitus with diabetic neuropathy, unspecified: Secondary | ICD-10-CM | POA: Diagnosis present

## 2017-02-06 DIAGNOSIS — N2581 Secondary hyperparathyroidism of renal origin: Secondary | ICD-10-CM | POA: Diagnosis present

## 2017-02-06 DIAGNOSIS — D509 Iron deficiency anemia, unspecified: Secondary | ICD-10-CM | POA: Diagnosis not present

## 2017-02-06 DIAGNOSIS — Z992 Dependence on renal dialysis: Secondary | ICD-10-CM | POA: Diagnosis not present

## 2017-02-06 DIAGNOSIS — D649 Anemia, unspecified: Secondary | ICD-10-CM | POA: Diagnosis not present

## 2017-02-06 DIAGNOSIS — K922 Gastrointestinal hemorrhage, unspecified: Secondary | ICD-10-CM | POA: Diagnosis not present

## 2017-02-06 DIAGNOSIS — D62 Acute posthemorrhagic anemia: Secondary | ICD-10-CM | POA: Diagnosis not present

## 2017-02-06 DIAGNOSIS — G458 Other transient cerebral ischemic attacks and related syndromes: Secondary | ICD-10-CM | POA: Diagnosis not present

## 2017-02-06 DIAGNOSIS — Z7982 Long term (current) use of aspirin: Secondary | ICD-10-CM | POA: Diagnosis not present

## 2017-02-06 DIAGNOSIS — I771 Stricture of artery: Secondary | ICD-10-CM | POA: Diagnosis not present

## 2017-02-06 DIAGNOSIS — K635 Polyp of colon: Secondary | ICD-10-CM | POA: Diagnosis not present

## 2017-02-06 DIAGNOSIS — D631 Anemia in chronic kidney disease: Secondary | ICD-10-CM | POA: Diagnosis present

## 2017-02-06 DIAGNOSIS — R05 Cough: Secondary | ICD-10-CM | POA: Diagnosis not present

## 2017-02-06 DIAGNOSIS — I509 Heart failure, unspecified: Secondary | ICD-10-CM | POA: Diagnosis not present

## 2017-02-06 DIAGNOSIS — R740 Nonspecific elevation of levels of transaminase and lactic acid dehydrogenase [LDH]: Secondary | ICD-10-CM | POA: Diagnosis not present

## 2017-02-06 DIAGNOSIS — K621 Rectal polyp: Secondary | ICD-10-CM | POA: Diagnosis not present

## 2017-02-06 DIAGNOSIS — Z09 Encounter for follow-up examination after completed treatment for conditions other than malignant neoplasm: Secondary | ICD-10-CM | POA: Diagnosis not present

## 2017-02-06 DIAGNOSIS — G629 Polyneuropathy, unspecified: Secondary | ICD-10-CM | POA: Diagnosis not present

## 2017-02-06 DIAGNOSIS — D500 Iron deficiency anemia secondary to blood loss (chronic): Secondary | ICD-10-CM | POA: Diagnosis present

## 2017-02-06 DIAGNOSIS — E538 Deficiency of other specified B group vitamins: Secondary | ICD-10-CM | POA: Diagnosis not present

## 2017-02-06 DIAGNOSIS — E039 Hypothyroidism, unspecified: Secondary | ICD-10-CM | POA: Diagnosis present

## 2017-02-06 LAB — CBC
HCT: 23.9 % — ABNORMAL LOW (ref 35.0–47.0)
HEMOGLOBIN: 8 g/dL — AB (ref 12.0–16.0)
MCH: 30.4 pg (ref 26.0–34.0)
MCHC: 33.2 g/dL (ref 32.0–36.0)
MCV: 91.5 fL (ref 80.0–100.0)
Platelets: 163 10*3/uL (ref 150–440)
RBC: 2.61 MIL/uL — ABNORMAL LOW (ref 3.80–5.20)
RDW: 18.2 % — AB (ref 11.5–14.5)
WBC: 7.6 10*3/uL (ref 3.6–11.0)

## 2017-02-06 LAB — TYPE AND SCREEN
ABO/RH(D): A POS
ANTIBODY SCREEN: NEGATIVE
EXTEND SAMPLE REASON: TRANSFUSED

## 2017-02-06 LAB — COMPREHENSIVE METABOLIC PANEL
ALBUMIN: 2.7 g/dL — AB (ref 3.5–5.0)
ALK PHOS: 49 U/L (ref 38–126)
ALT: 16 U/L (ref 14–54)
ANION GAP: 13 (ref 5–15)
AST: 14 U/L — ABNORMAL LOW (ref 15–41)
BUN: 78 mg/dL — ABNORMAL HIGH (ref 6–20)
CO2: 25 mmol/L (ref 22–32)
Calcium: 6.9 mg/dL — ABNORMAL LOW (ref 8.9–10.3)
Chloride: 102 mmol/L (ref 101–111)
Creatinine, Ser: 14.08 mg/dL — ABNORMAL HIGH (ref 0.44–1.00)
GFR calc Af Amer: 3 mL/min — ABNORMAL LOW (ref >60)
GFR calc non Af Amer: 2 mL/min — ABNORMAL LOW (ref >60)
GLUCOSE: 162 mg/dL — AB (ref 65–99)
POTASSIUM: 5.3 mmol/L — AB (ref 3.5–5.1)
SODIUM: 140 mmol/L (ref 135–145)
Total Bilirubin: 0.8 mg/dL (ref 0.3–1.2)
Total Protein: 5.7 g/dL — ABNORMAL LOW (ref 6.5–8.1)

## 2017-02-06 LAB — APTT: aPTT: 33 seconds (ref 24–36)

## 2017-02-06 LAB — PROTIME-INR
INR: 1.03
Prothrombin Time: 13.5 seconds (ref 11.4–15.2)

## 2017-02-06 LAB — SURGICAL PCR SCREEN
MRSA, PCR: NEGATIVE
Staphylococcus aureus: NEGATIVE

## 2017-02-06 NOTE — Patient Instructions (Signed)
Your procedure is scheduled on: 02/07/17 arrive at 12:30 Report to [facility]. 2ND FLOOR MEDICAL MALL ENTRANCE.   Remember: Instructions that are not followed completely may result in serious medical risk, up to and including death, or upon the discretion of your surgeon and anesthesiologist your surgery may need to be rescheduled.    __X__ 1. Do not eat food or drink liquids after midnight. No gum chewing or hard candies.     __X__ 2. No Alcohol for 24 hours before or after surgery.   ____ 3. Bring all medications with you on the day of surgery if instructed.    __X__ 4. Notify your doctor if there is any change in your medical condition     (cold, fever, infections).             __x___5. No smoking within 24 hours of your surgery.     Do not wear jewelry, make-up, hairpins, clips or nail polish.  Do not wear lotions, powders, or perfumes.   Do not shave 48 hours prior to surgery. Men may shave face and neck.  Do not bring valuables to the hospital.    [facility] is not responsible for any belongings or valuables.               Contacts, dentures or bridgework may not be worn into surgery.  Leave your suitcase in the car. After surgery it may be brought to your room.  For patients admitted to the hospital, discharge time is determined by your                treatment team.   Patients discharged the day of surgery will not be allowed to drive home.   Please read over the following fact sheets that you were given:   MRSA Information   __x__ Take these medicines the morning of surgery with A SIP OF WATER:    1. amlodipine  2. carvedilol  3. hydralazine  4. levothyroxine  5. pantoprazole  6.  ____ Fleet Enema (as directed)   __x__ Use CHG Soap as directed  ____ Use inhalers on the day of surgery  ____ Stop metformin 2 days prior to surgery    __x__ Take 1/2 of usual insulin dose the night before surgery and none on the morning of surgery.   ____ Stop  Coumadin/Plavix/aspirin on   __X__ Stop Anti-inflammatories such as Advil, Aleve, Ibuprofen, Motrin, Naproxen, Naprosyn, Goodies,powder, or aspirin products.  OK to take Tylenol.   ____ Stop supplements until after surgery.    ____ Bring C-Pap to the hospital.

## 2017-02-07 ENCOUNTER — Ambulatory Visit: Payer: Medicare HMO | Admitting: Anesthesiology

## 2017-02-07 ENCOUNTER — Ambulatory Visit
Admission: RE | Admit: 2017-02-07 | Discharge: 2017-02-07 | Disposition: A | Discharge status: Home / Self Care | Payer: Medicare HMO | Source: Ambulatory Visit | Attending: Vascular Surgery | Admitting: Vascular Surgery

## 2017-02-07 ENCOUNTER — Encounter
Admission: RE | Disposition: A | Discharge status: Home / Self Care | Payer: Self-pay | Source: Ambulatory Visit | Attending: Vascular Surgery

## 2017-02-07 ENCOUNTER — Encounter: Payer: Self-pay | Admitting: *Deleted

## 2017-02-07 DIAGNOSIS — E1122 Type 2 diabetes mellitus with diabetic chronic kidney disease: Secondary | ICD-10-CM | POA: Insufficient documentation

## 2017-02-07 DIAGNOSIS — G458 Other transient cerebral ischemic attacks and related syndromes: Secondary | ICD-10-CM | POA: Diagnosis not present

## 2017-02-07 DIAGNOSIS — I509 Heart failure, unspecified: Secondary | ICD-10-CM

## 2017-02-07 DIAGNOSIS — E039 Hypothyroidism, unspecified: Secondary | ICD-10-CM | POA: Insufficient documentation

## 2017-02-07 DIAGNOSIS — I25119 Atherosclerotic heart disease of native coronary artery with unspecified angina pectoris: Secondary | ICD-10-CM | POA: Insufficient documentation

## 2017-02-07 DIAGNOSIS — Z7982 Long term (current) use of aspirin: Secondary | ICD-10-CM

## 2017-02-07 DIAGNOSIS — Z992 Dependence on renal dialysis: Secondary | ICD-10-CM

## 2017-02-07 DIAGNOSIS — Z79899 Other long term (current) drug therapy: Secondary | ICD-10-CM

## 2017-02-07 DIAGNOSIS — E1140 Type 2 diabetes mellitus with diabetic neuropathy, unspecified: Secondary | ICD-10-CM | POA: Insufficient documentation

## 2017-02-07 DIAGNOSIS — Z7902 Long term (current) use of antithrombotics/antiplatelets: Secondary | ICD-10-CM

## 2017-02-07 DIAGNOSIS — I132 Hypertensive heart and chronic kidney disease with heart failure and with stage 5 chronic kidney disease, or end stage renal disease: Secondary | ICD-10-CM

## 2017-02-07 DIAGNOSIS — Y832 Surgical operation with anastomosis, bypass or graft as the cause of abnormal reaction of the patient, or of later complication, without mention of misadventure at the time of the procedure: Secondary | ICD-10-CM | POA: Insufficient documentation

## 2017-02-07 DIAGNOSIS — Z955 Presence of coronary angioplasty implant and graft: Secondary | ICD-10-CM | POA: Insufficient documentation

## 2017-02-07 DIAGNOSIS — Z87891 Personal history of nicotine dependence: Secondary | ICD-10-CM | POA: Insufficient documentation

## 2017-02-07 DIAGNOSIS — T82898A Other specified complication of vascular prosthetic devices, implants and grafts, initial encounter: Secondary | ICD-10-CM

## 2017-02-07 DIAGNOSIS — N186 End stage renal disease: Secondary | ICD-10-CM

## 2017-02-07 DIAGNOSIS — Z794 Long term (current) use of insulin: Secondary | ICD-10-CM

## 2017-02-07 DIAGNOSIS — E119 Type 2 diabetes mellitus without complications: Secondary | ICD-10-CM | POA: Diagnosis not present

## 2017-02-07 HISTORY — DX: Personal history of other medical treatment: Z92.89

## 2017-02-07 HISTORY — PX: LIGATION OF ARTERIOVENOUS  FISTULA: SHX5948

## 2017-02-07 LAB — POCT I-STAT 4, (NA,K, GLUC, HGB,HCT)
Glucose, Bld: 90 mg/dL (ref 65–99)
HCT: 20 % — ABNORMAL LOW (ref 36.0–46.0)
Hemoglobin: 6.8 g/dL — CL (ref 12.0–15.0)
Potassium: 5.5 mmol/L — ABNORMAL HIGH (ref 3.5–5.1)
SODIUM: 138 mmol/L (ref 135–145)

## 2017-02-07 LAB — GLUCOSE, CAPILLARY: Glucose-Capillary: 89 mg/dL (ref 65–99)

## 2017-02-07 SURGERY — LIGATION OF ARTERIOVENOUS  FISTULA
Anesthesia: General | Laterality: Left | Wound class: Clean

## 2017-02-07 MED ORDER — LIDOCAINE HCL (CARDIAC) 20 MG/ML IV SOLN
INTRAVENOUS | Status: DC | PRN
  Administered 2017-02-07: 50 mg via INTRAVENOUS

## 2017-02-07 MED ORDER — BUPIVACAINE HCL (PF) 0.5 % IJ SOLN
INTRAMUSCULAR | Status: DC | PRN
  Administered 2017-02-07: 4 mL

## 2017-02-07 MED ORDER — HEPARIN SODIUM (PORCINE) 5000 UNIT/ML IJ SOLN
INTRAMUSCULAR | Status: AC
  Filled 2017-02-07: qty 1

## 2017-02-07 MED ORDER — CLINDAMYCIN PHOSPHATE 300 MG/50ML IV SOLN
INTRAVENOUS | Status: AC
  Filled 2017-02-07: qty 50

## 2017-02-07 MED ORDER — MIDAZOLAM HCL 2 MG/2ML IJ SOLN
INTRAMUSCULAR | Status: DC | PRN
  Administered 2017-02-07: 1 mg via INTRAVENOUS

## 2017-02-07 MED ORDER — ONDANSETRON HCL 4 MG/2ML IJ SOLN: 4.0000 mg | Freq: Once | INTRAMUSCULAR | Status: DC | PRN

## 2017-02-07 MED ORDER — ONDANSETRON HCL 4 MG/2ML IJ SOLN
INTRAMUSCULAR | Status: AC
Start: 2017-02-07 — End: ?
  Filled 2017-02-07: qty 2

## 2017-02-07 MED ORDER — PROPOFOL 500 MG/50ML IV EMUL
INTRAVENOUS | Status: DC | PRN
  Administered 2017-02-07: 55 ug/kg/min via INTRAVENOUS

## 2017-02-07 MED ORDER — PROPOFOL 10 MG/ML IV BOLUS
INTRAVENOUS | Status: AC
  Filled 2017-02-07: qty 20

## 2017-02-07 MED ORDER — BUPIVACAINE HCL (PF) 0.5 % IJ SOLN
INTRAMUSCULAR | Status: AC
  Filled 2017-02-07: qty 30

## 2017-02-07 MED ORDER — SODIUM CHLORIDE 0.9 % IV SOLN
INTRAVENOUS | Status: DC
  Administered 2017-02-07: 13:00:00 via INTRAVENOUS

## 2017-02-07 MED ORDER — EPHEDRINE SULFATE 50 MG/ML IJ SOLN
INTRAMUSCULAR | Status: DC | PRN
  Administered 2017-02-07: 5 mg via INTRAVENOUS

## 2017-02-07 MED ORDER — FENTANYL CITRATE (PF) 100 MCG/2ML IJ SOLN
INTRAMUSCULAR | Status: DC | PRN
Start: 2017-02-07 — End: 2017-02-07
  Administered 2017-02-07: 50 ug via INTRAVENOUS

## 2017-02-07 MED ORDER — CLINDAMYCIN PHOSPHATE 300 MG/50ML IV SOLN
300.0000 mg | Freq: Once | INTRAVENOUS | Status: AC
  Administered 2017-02-07: 300 mg via INTRAVENOUS

## 2017-02-07 MED ORDER — FENTANYL CITRATE (PF) 100 MCG/2ML IJ SOLN
INTRAMUSCULAR | Status: AC
  Filled 2017-02-07: qty 2

## 2017-02-07 MED ORDER — ONDANSETRON HCL 4 MG/2ML IJ SOLN
INTRAMUSCULAR | Status: DC | PRN
  Administered 2017-02-07: 4 mg via INTRAVENOUS

## 2017-02-07 MED ORDER — SODIUM CHLORIDE 0.9 % IV SOLN
INTRAVENOUS | Status: DC | PRN
  Administered 2017-02-07: 50 ug/min via INTRAVENOUS

## 2017-02-07 MED ORDER — PHENYLEPHRINE HCL 10 MG/ML IJ SOLN
INTRAMUSCULAR | Status: DC | PRN
  Administered 2017-02-07 (×2): 100 ug via INTRAVENOUS

## 2017-02-07 MED ORDER — PROPOFOL 500 MG/50ML IV EMUL
INTRAVENOUS | Status: AC
  Filled 2017-02-07: qty 50

## 2017-02-07 MED ORDER — MIDAZOLAM HCL 2 MG/2ML IJ SOLN
INTRAMUSCULAR | Status: AC
  Filled 2017-02-07: qty 2

## 2017-02-07 MED ORDER — TRAMADOL HCL 50 MG PO TABS: 50.0000 mg | ORAL_TABLET | Freq: Three times a day (TID) | ORAL | 0 refills | Status: DC | PRN

## 2017-02-07 MED ORDER — FENTANYL CITRATE (PF) 100 MCG/2ML IJ SOLN: 25.0000 ug | INTRAMUSCULAR | Status: DC | PRN

## 2017-02-07 MED ORDER — LIDOCAINE HCL (PF) 2 % IJ SOLN
INTRAMUSCULAR | Status: AC
  Filled 2017-02-07: qty 2

## 2017-02-07 SURGICAL SUPPLY — 46 items
BAG DECANTER FOR FLEXI CONT (MISCELLANEOUS) ×3 IMPLANT
BLADE SURG SZ11 CARB STEEL (BLADE) ×3 IMPLANT
BOOT SUTURE AID YELLOW STND (SUTURE) IMPLANT
CANISTER SUCT 1200ML W/VALVE (MISCELLANEOUS) ×3 IMPLANT
CHLORAPREP W/TINT 26ML (MISCELLANEOUS) ×3 IMPLANT
CLIP TI MEDIUM 6 (CLIP) IMPLANT
CLIP TI WIDE RED SMALL 6 (CLIP) ×6 IMPLANT
DERMABOND ADVANCED (GAUZE/BANDAGES/DRESSINGS) ×2
DERMABOND ADVANCED .7 DNX12 (GAUZE/BANDAGES/DRESSINGS) ×1 IMPLANT
DRESSING SURGICEL FIBRLLR 1X2 (HEMOSTASIS) IMPLANT
DRSG SURGICEL FIBRILLAR 1X2 (HEMOSTASIS)
ELECT CAUTERY BLADE 6.4 (BLADE) ×3 IMPLANT
ELECT REM PT RETURN 9FT ADLT (ELECTROSURGICAL) ×3
ELECTRODE REM PT RTRN 9FT ADLT (ELECTROSURGICAL) ×1 IMPLANT
GLOVE SURG SYN 8.0 (GLOVE) ×15 IMPLANT
GOWN STRL REUS W/ TWL LRG LVL3 (GOWN DISPOSABLE) ×2 IMPLANT
GOWN STRL REUS W/ TWL XL LVL3 (GOWN DISPOSABLE) ×1 IMPLANT
GOWN STRL REUS W/TWL LRG LVL3 (GOWN DISPOSABLE) ×4
GOWN STRL REUS W/TWL XL LVL3 (GOWN DISPOSABLE) ×2
IV NS 500ML (IV SOLUTION) ×2
IV NS 500ML BAXH (IV SOLUTION) ×1 IMPLANT
KIT RM TURNOVER STRD PROC AR (KITS) ×3 IMPLANT
LABEL OR SOLS (LABEL) IMPLANT
LOOP RED MAXI  1X406MM (MISCELLANEOUS) ×2
LOOP VESSEL MAXI 1X406 RED (MISCELLANEOUS) ×1 IMPLANT
LOOP VESSEL MINI 0.8X406 BLUE (MISCELLANEOUS) ×1 IMPLANT
LOOPS BLUE MINI 0.8X406MM (MISCELLANEOUS) ×2
NEEDLE FILTER BLUNT 18X 1/2SAF (NEEDLE)
NEEDLE FILTER BLUNT 18X1 1/2 (NEEDLE) IMPLANT
PACK EXTREMITY ARMC (MISCELLANEOUS) ×3 IMPLANT
PAD PREP 24X41 OB/GYN DISP (PERSONAL CARE ITEMS) ×3 IMPLANT
STOCKINETTE STRL 4IN 9604848 (GAUZE/BANDAGES/DRESSINGS) ×3 IMPLANT
SUT ETHIBOND 0 36 GRN (SUTURE) IMPLANT
SUT MNCRL+ 5-0 UNDYED PC-3 (SUTURE) ×1 IMPLANT
SUT MONOCRYL 5-0 (SUTURE) ×2
SUT PROLENE 6 0 BV (SUTURE) ×3 IMPLANT
SUT SILK 2 0 (SUTURE) ×2
SUT SILK 2-0 18XBRD TIE 12 (SUTURE) ×1 IMPLANT
SUT SILK 3 0 (SUTURE) ×2
SUT SILK 3-0 18XBRD TIE 12 (SUTURE) ×1 IMPLANT
SUT SILK 4 0 (SUTURE) ×2
SUT SILK 4-0 18XBRD TIE 12 (SUTURE) ×1 IMPLANT
SUT VIC AB 3-0 SH 27 (SUTURE) ×2
SUT VIC AB 3-0 SH 27X BRD (SUTURE) ×1 IMPLANT
SYR 20CC LL (SYRINGE) ×3 IMPLANT
SYR 3ML LL SCALE MARK (SYRINGE) IMPLANT

## 2017-02-07 NOTE — Progress Notes (Signed)
MD and anes aware of k 5.5

## 2017-02-07 NOTE — Transfer of Care (Signed)
Immediate Anesthesia Transfer of Care Note  Patient: Bethany Lee  Procedure(s) Performed: Procedure(s): LIGATION OF ARTERIOVENOUS  FISTULA ( BANDING BRACHIAL CEPHALIC ) (Left)  Patient Location: PACU  Anesthesia Type:MAC  Level of Consciousness: sedated and responds to stimulation  Airway & Oxygen Therapy: Patient Spontanous Breathing and Patient connected to face mask oxygen  Post-op Assessment: Report given to RN and Post -op Vital signs reviewed and stable  Post vital signs: Reviewed and stable  Last Vitals:  Vitals:   02/07/17 1243 02/07/17 1445  BP: (!) 144/53 (!) 130/44  Pulse: 79 68  Resp: 18 15  Temp: (!) 36.1 C     Last Pain:  Vitals:   02/07/17 1243  TempSrc: Tympanic  PainSc: 6          Complications: No apparent anesthesia complications

## 2017-02-07 NOTE — H&P (Signed)
[facility] History & Physical Update  The patient was interviewed and re-examined.  The patient's previous History and Physical has been reviewed and is unchanged.  There is no change in the plan of care. We plan to proceed with the scheduled procedure.  Hortencia Pilar, MD  02/07/2017, 1:24 PM

## 2017-02-07 NOTE — Anesthesia Procedure Notes (Signed)
Procedure Name: MAC Performed by: Lance Muss Pre-anesthesia Checklist: Patient identified, Emergency Drugs available, Suction available, Patient being monitored and Timeout performed Oxygen Delivery Method: Simple face mask Preoxygenation: Pre-oxygenation with 100% oxygen

## 2017-02-07 NOTE — Anesthesia Post-op Follow-up Note (Cosign Needed)
Anesthesia QCDR form completed.        

## 2017-02-07 NOTE — Pre-Procedure Instructions (Signed)
Met B sent to Dr. Schnier and Anesthesia for review. 

## 2017-02-07 NOTE — Anesthesia Preprocedure Evaluation (Signed)
Anesthesia Evaluation  Patient identified by MRN, date of birth, ID band Patient awake    Reviewed: Allergy & Precautions, H&P , NPO status , Patient's Chart, lab work & pertinent test results, reviewed documented beta blocker date and time   History of Anesthesia Complications (+) PONV and history of anesthetic complications  Airway Mallampati: II  TM Distance: >3 FB Neck ROM: full    Dental  (+) Teeth Intact   Pulmonary neg pulmonary ROS, shortness of breath and with exertion, former smoker,    Pulmonary exam normal        Cardiovascular Exercise Tolerance: Good hypertension, + CAD and +CHF  negative cardio ROS Normal cardiovascular exam Rhythm:regular Rate:Normal     Neuro/Psych negative neurological ROS  negative psych ROS   GI/Hepatic negative GI ROS, Neg liver ROS,   Endo/Other  negative endocrine ROSdiabetesHypothyroidism   Renal/GU ESRF and DialysisRenal diseasenegative Renal ROS Bladder dysfunction   negative genitourinary   Musculoskeletal   Abdominal   Peds  Hematology negative hematology ROS (+) anemia ,   Anesthesia Other Findings Past Medical History: No date: Anemia No date: CHF (congestive heart failure) (HCC) No date: Chronic kidney disease     Comment:  peritoneal dialysis No date: Complication of anesthesia No date: Coronary artery disease No date: Diabetes mellitus without complication ([place]) No date: Dialysis patient [facility])     Comment:  Peritoneal dialysis patient No date: Dyspnea     Comment:  doe No date: Edema 01/2017: History of recent blood transfusion No date: Hypercholesterolemia No date: Hypertension No date: Hypothyroidism No date: Neuropathy No date: PONV (postoperative nausea and vomiting) No date: Shingles     Comment:  October-November 2017 No date: Tremors of nervous system     Comment:  intermittent when taking gabapentin Past Surgical History: 01/16/2017: A/V  SHUNT INTERVENTION; N/A     Comment:  Procedure: A/V Shunt Intervention;  Surgeon: Katha Cabal, MD;  Location: [place] [facility];  Service:              Cardiovascular;  Laterality: N/A; 01/16/2017: A/V SHUNTOGRAM; Left     Comment:  Procedure: A/V Fistulagram;  Surgeon: Katha Cabal, MD;  Location: [place] [facility];  Service:               Cardiovascular;  Laterality: Left; No date: ABDOMINAL HYSTERECTOMY 11/30/2016: AV FISTULA PLACEMENT; Left     Comment:  Procedure: ARTERIOVENOUS (AV) FISTULA CREATION ( EXPLORE              FOR CREATION BRACHIOCEPHALIC);  Surgeon: Algernon Huxley,               MD;  Location: [facility];  Service: Vascular;  Laterality:              Left; 11/25/2015: CAPD INSERTION; N/A     Comment:  Procedure: LAPAROSCOPIC INSERTION CONTINUOUS AMBULATORY               PERITONEAL DIALYSIS  (CAPD) CATHETER;  Surgeon: Algernon Huxley, MD;  Location: [facility];  Service: Vascular;                Laterality: N/A; 01/06/2016: CAPD INSERTION; N/A     Comment:  Procedure: LAPAROSCOPIC  INSERTION CONTINUOUS AMBULATORY               PERITONEAL DIALYSIS  (CAPD) CATHETER REVISION ;  Surgeon:              Algernon Huxley, MD;  Location: [facility];  Service: Vascular;              Laterality: N/A; 10/11/2016: CAPD INSERTION; N/A     Comment:  Procedure: LAPAROSCOPIC INSERTION CONTINUOUS AMBULATORY               PERITONEAL DIALYSIS  (CAPD) CATHETER ( REVISION );                Surgeon: Algernon Huxley, MD;  Location: [facility];  Service:               Vascular;  Laterality: N/A; No date: CARDIAC CATHETERIZATION 05/25/2016: CATARACT EXTRACTION W/PHACO; Right     Comment:  Procedure: CATARACT EXTRACTION PHACO AND INTRAOCULAR               LENS PLACEMENT ([place]);  Surgeon: Eulogio Bear, MD;                Location: [facility];  Service: Ophthalmology;  Laterality:              Right;  Lot # [id number] H [place]: 00:5.2 AP%: 11.3 CDE: 7.21 No date:  CORONARY ANGIOPLASTY 09/07/2016: CORONARY STENT INTERVENTION; N/A     Comment:  Procedure: Coronary Stent Intervention;  Surgeon:               Isaias Cowman, MD;  Location: [place] [facility];              Service: Cardiovascular;  Laterality: N/A; 09/21/2016: DIALYSIS/PERMA CATHETER INSERTION; N/A     Comment:  Procedure: Dialysis/Perma Catheter Insertion;  Surgeon:               Algernon Huxley, MD;  Location: [place] [facility];                Service: Cardiovascular;  Laterality: N/A; 11/30/2016: DIALYSIS/PERMA CATHETER REMOVAL; Right     Comment:  Procedure: DIALYSIS/PERMA CATHETER REMOVAL;  Surgeon:               Algernon Huxley, MD;  Location: [facility];  Service:               Vascular;  Laterality: Right; No date: EYE SURGERY 09/07/2016: LEFT HEART CATH AND CORONARY ANGIOGRAPHY; Left     Comment:  Procedure: Left Heart Cath and Coronary Angiography;                Surgeon: Isaias Cowman, MD;  Location: [place] [facility];  Service: Cardiovascular;  Laterality:               Left; No date: TUBAL LIGATION 01/19/2017: UPPER EXTREMITY ANGIOGRAPHY; Left     Comment:  Procedure: Upper Extremity Angiography;  Surgeon:               Katha Cabal, MD;  Location: [place] [facility];               Service: Cardiovascular;  Laterality: Left;   Reproductive/Obstetrics negative OB ROS  Anesthesia Physical Anesthesia Plan  ASA: IV and emergent  Anesthesia Plan: General LMA   Post-op Pain Management:    Induction:   PONV Risk Score and Plan: 4 or greater and Ondansetron, Dexamethasone, Propofol and Midazolam  Airway Management Planned:   Additional Equipment:   Intra-op Plan:   Post-operative Plan:   Informed Consent: I have reviewed the patients History and Physical, chart, labs and discussed the procedure including the risks, benefits and alternatives for the proposed anesthesia with the patient  or authorized representative who has indicated his/her understanding and acceptance.   Dental Advisory Given  Plan Discussed with: CRNA  Anesthesia Plan Comments:         Anesthesia Quick Evaluation

## 2017-02-07 NOTE — Discharge Instructions (Signed)

## 2017-02-07 NOTE — Op Note (Signed)
    OPERATIVE NOTE   PROCEDURE: Revision of left arm brachial cephalic fistula with banding of the AV access for elimination of steal syndrome  PRE-OPERATIVE DIAGNOSIS: Steal syndrome secondary to AV dialysis access; end stage renal disease on hemodialysis; painful extremity  POST-OPERATIVE DIAGNOSIS: Same  SURGEON: Hortencia Pilar  ANESTHESIA: MAC  ESTIMATED BLOOD LOSS: 15 cc  FINDING(S): After banding there is a 2+ radial pulse at the left wrist  SPECIMEN(S):  none  INDICATIONS:   Bethany Lee is a 65 y.o. female who presents with painful left hand secondary to the dialysis access.  Physical examination as well as noninvasive studies support steal syndrome and therefore banding of the access is appropriate area  DESCRIPTION: After full informed written consent was obtained from the patient, the patient was brought back to the operating room and placed supine upon the operating table.  Prior to induction, the patient received IV antibiotics.   After obtaining adequate anesthesia, the patient was then prepped and draped in the standard fashion for a  Revision of the left arm access.  A linear incision was made over the left brachial cephalic fistula and the access was exposed. The dissection was carried out circumferentially. Medium surgical clips were then used in a stepwise fashion to narrow the lumen. The clips were stacked one upon the other over distance of approximately 10 mm. As each clip was placed the radial artery pulse was palpated and once a 1-2+ pulse was appreciated the AV access was assessed and it was found to have a continuous thrill. This indicates the graft is still functioning wall of the hand should have adequate perfusion.   The wound was then irrigated with sterile saline area did Hemostasis was then obtained with Bovie cautery. Fibrillar was then applied to the area. The wound was then closed in layers using 3-0 Vicryl followed by 4-0 Monocryl subcuticular  and then Dermabond.     The patient tolerated this procedure well.   COMPLICATIONS: None  CONDITION: Bethany Lee [facility]  Office: [phone]   02/07/2017, 2:44 PM

## 2017-02-08 ENCOUNTER — Encounter: Payer: Self-pay | Admitting: Vascular Surgery

## 2017-02-08 DIAGNOSIS — Z992 Dependence on renal dialysis: Secondary | ICD-10-CM | POA: Diagnosis not present

## 2017-02-08 DIAGNOSIS — N186 End stage renal disease: Secondary | ICD-10-CM | POA: Diagnosis not present

## 2017-02-09 ENCOUNTER — Other Ambulatory Visit
Admission: RE | Admit: 2017-02-09 | Discharge: 2017-02-09 | Disposition: A | Discharge status: Home / Self Care | Payer: Medicare HMO | Source: Ambulatory Visit | Attending: Nephrology | Admitting: Nephrology

## 2017-02-09 DIAGNOSIS — N186 End stage renal disease: Secondary | ICD-10-CM | POA: Diagnosis not present

## 2017-02-09 DIAGNOSIS — D631 Anemia in chronic kidney disease: Secondary | ICD-10-CM | POA: Insufficient documentation

## 2017-02-09 DIAGNOSIS — Z992 Dependence on renal dialysis: Secondary | ICD-10-CM | POA: Diagnosis not present

## 2017-02-09 LAB — HEMOGLOBIN: Hemoglobin: 5.8 g/dL — ABNORMAL LOW (ref 12.0–16.0)

## 2017-02-10 ENCOUNTER — Encounter: Payer: Self-pay | Admitting: Emergency Medicine

## 2017-02-10 ENCOUNTER — Inpatient Hospital Stay
Admission: EM | Admit: 2017-02-10 | Discharge: 2017-02-16 | DRG: 377 | Disposition: A | Discharge status: Home / Self Care | Payer: Medicare HMO | Attending: Internal Medicine | Admitting: Internal Medicine

## 2017-02-10 DIAGNOSIS — E7800 Pure hypercholesterolemia, unspecified: Secondary | ICD-10-CM | POA: Diagnosis present

## 2017-02-10 DIAGNOSIS — G629 Polyneuropathy, unspecified: Secondary | ICD-10-CM | POA: Diagnosis not present

## 2017-02-10 DIAGNOSIS — D631 Anemia in chronic kidney disease: Secondary | ICD-10-CM | POA: Diagnosis present

## 2017-02-10 DIAGNOSIS — Z79899 Other long term (current) drug therapy: Secondary | ICD-10-CM

## 2017-02-10 DIAGNOSIS — K640 First degree hemorrhoids: Secondary | ICD-10-CM | POA: Diagnosis present

## 2017-02-10 DIAGNOSIS — I5022 Chronic systolic (congestive) heart failure: Secondary | ICD-10-CM | POA: Diagnosis present

## 2017-02-10 DIAGNOSIS — R740 Nonspecific elevation of levels of transaminase and lactic acid dehydrogenase [LDH]: Secondary | ICD-10-CM | POA: Diagnosis not present

## 2017-02-10 DIAGNOSIS — K922 Gastrointestinal hemorrhage, unspecified: Secondary | ICD-10-CM | POA: Diagnosis not present

## 2017-02-10 DIAGNOSIS — R059 Cough, unspecified: Secondary | ICD-10-CM

## 2017-02-10 DIAGNOSIS — R05 Cough: Secondary | ICD-10-CM

## 2017-02-10 DIAGNOSIS — I2510 Atherosclerotic heart disease of native coronary artery without angina pectoris: Secondary | ICD-10-CM | POA: Diagnosis present

## 2017-02-10 DIAGNOSIS — N186 End stage renal disease: Secondary | ICD-10-CM | POA: Diagnosis present

## 2017-02-10 DIAGNOSIS — Z961 Presence of intraocular lens: Secondary | ICD-10-CM | POA: Diagnosis present

## 2017-02-10 DIAGNOSIS — K2980 Duodenitis without bleeding: Secondary | ICD-10-CM | POA: Diagnosis not present

## 2017-02-10 DIAGNOSIS — K621 Rectal polyp: Secondary | ICD-10-CM | POA: Diagnosis not present

## 2017-02-10 DIAGNOSIS — Z87891 Personal history of nicotine dependence: Secondary | ICD-10-CM

## 2017-02-10 DIAGNOSIS — Z880 Allergy status to penicillin: Secondary | ICD-10-CM | POA: Diagnosis not present

## 2017-02-10 DIAGNOSIS — Z9841 Cataract extraction status, right eye: Secondary | ICD-10-CM

## 2017-02-10 DIAGNOSIS — D500 Iron deficiency anemia secondary to blood loss (chronic): Secondary | ICD-10-CM | POA: Diagnosis present

## 2017-02-10 DIAGNOSIS — D509 Iron deficiency anemia, unspecified: Secondary | ICD-10-CM | POA: Diagnosis not present

## 2017-02-10 DIAGNOSIS — D62 Acute posthemorrhagic anemia: Secondary | ICD-10-CM | POA: Diagnosis present

## 2017-02-10 DIAGNOSIS — Z7902 Long term (current) use of antithrombotics/antiplatelets: Secondary | ICD-10-CM

## 2017-02-10 DIAGNOSIS — R7989 Other specified abnormal findings of blood chemistry: Secondary | ICD-10-CM | POA: Diagnosis not present

## 2017-02-10 DIAGNOSIS — K921 Melena: Principal | ICD-10-CM | POA: Diagnosis present

## 2017-02-10 DIAGNOSIS — I132 Hypertensive heart and chronic kidney disease with heart failure and with stage 5 chronic kidney disease, or end stage renal disease: Secondary | ICD-10-CM | POA: Diagnosis present

## 2017-02-10 DIAGNOSIS — N2581 Secondary hyperparathyroidism of renal origin: Secondary | ICD-10-CM | POA: Diagnosis present

## 2017-02-10 DIAGNOSIS — E1122 Type 2 diabetes mellitus with diabetic chronic kidney disease: Secondary | ICD-10-CM | POA: Diagnosis present

## 2017-02-10 DIAGNOSIS — Z992 Dependence on renal dialysis: Secondary | ICD-10-CM | POA: Diagnosis not present

## 2017-02-10 DIAGNOSIS — Z7982 Long term (current) use of aspirin: Secondary | ICD-10-CM

## 2017-02-10 DIAGNOSIS — E039 Hypothyroidism, unspecified: Secondary | ICD-10-CM | POA: Diagnosis present

## 2017-02-10 DIAGNOSIS — Z86010 Personal history of colonic polyps: Secondary | ICD-10-CM

## 2017-02-10 DIAGNOSIS — Z955 Presence of coronary angioplasty implant and graft: Secondary | ICD-10-CM | POA: Diagnosis not present

## 2017-02-10 DIAGNOSIS — E1140 Type 2 diabetes mellitus with diabetic neuropathy, unspecified: Secondary | ICD-10-CM | POA: Diagnosis present

## 2017-02-10 DIAGNOSIS — Z794 Long term (current) use of insulin: Secondary | ICD-10-CM

## 2017-02-10 DIAGNOSIS — I509 Heart failure, unspecified: Secondary | ICD-10-CM | POA: Diagnosis not present

## 2017-02-10 LAB — COMPREHENSIVE METABOLIC PANEL
ALK PHOS: 32 U/L — AB (ref 38–126)
ALT: 11 U/L — AB (ref 14–54)
AST: 12 U/L — ABNORMAL LOW (ref 15–41)
Albumin: 2.5 g/dL — ABNORMAL LOW (ref 3.5–5.0)
Anion gap: 15 (ref 5–15)
BILIRUBIN TOTAL: 0.7 mg/dL (ref 0.3–1.2)
BUN: 115 mg/dL — ABNORMAL HIGH (ref 6–20)
CALCIUM: 7.3 mg/dL — AB (ref 8.9–10.3)
CO2: 24 mmol/L (ref 22–32)
CREATININE: 14.01 mg/dL — AB (ref 0.44–1.00)
Chloride: 96 mmol/L — ABNORMAL LOW (ref 101–111)
GFR, EST AFRICAN AMERICAN: 3 mL/min — AB (ref >60)
GFR, EST NON AFRICAN AMERICAN: 2 mL/min — AB (ref >60)
Glucose, Bld: 185 mg/dL — ABNORMAL HIGH (ref 65–99)
Potassium: 5.4 mmol/L — ABNORMAL HIGH (ref 3.5–5.1)
Sodium: 135 mmol/L (ref 135–145)
TOTAL PROTEIN: 5 g/dL — AB (ref 6.5–8.1)

## 2017-02-10 LAB — RETICULOCYTES
RBC.: 1.48 MIL/uL — AB (ref 3.80–5.20)
RETIC COUNT ABSOLUTE: 79.9 10*3/uL (ref 19.0–183.0)
Retic Ct Pct: 5.4 % — ABNORMAL HIGH (ref 0.4–3.1)

## 2017-02-10 LAB — IRON AND TIBC
Iron: 133 ug/dL (ref 28–170)
Saturation Ratios: 56 % — ABNORMAL HIGH (ref 10.4–31.8)
TIBC: 238 ug/dL — ABNORMAL LOW (ref 250–450)
UIBC: 105 ug/dL

## 2017-02-10 LAB — MRSA PCR SCREENING: MRSA BY PCR: NEGATIVE

## 2017-02-10 LAB — CBC
HCT: 13.4 % — CL (ref 35.0–47.0)
Hemoglobin: 4.6 g/dL — CL (ref 12.0–16.0)
MCH: 31.8 pg (ref 26.0–34.0)
MCHC: 34.3 g/dL (ref 32.0–36.0)
MCV: 92.8 fL (ref 80.0–100.0)
PLATELETS: 129 10*3/uL — AB (ref 150–440)
RBC: 1.45 MIL/uL — AB (ref 3.80–5.20)
RDW: 18.3 % — ABNORMAL HIGH (ref 11.5–14.5)
WBC: 7.6 10*3/uL (ref 3.6–11.0)

## 2017-02-10 LAB — GLUCOSE, CAPILLARY
GLUCOSE-CAPILLARY: 129 mg/dL — AB (ref 65–99)
GLUCOSE-CAPILLARY: 145 mg/dL — AB (ref 65–99)

## 2017-02-10 LAB — FERRITIN: FERRITIN: 401 ng/mL — AB (ref 11–307)

## 2017-02-10 LAB — PROTIME-INR
INR: 1.07
Prothrombin Time: 13.9 seconds (ref 11.4–15.2)

## 2017-02-10 LAB — VITAMIN B12: Vitamin B-12: >7500 pg/mL — ABNORMAL HIGH (ref 180–914)

## 2017-02-10 LAB — FOLATE: Folate: 31 ng/mL (ref >5.9)

## 2017-02-10 LAB — PREPARE RBC (CROSSMATCH)

## 2017-02-10 MED ORDER — SODIUM BICARBONATE 650 MG PO TABS
1300.0000 mg | ORAL_TABLET | Freq: Two times a day (BID) | ORAL | Status: DC
  Administered 2017-02-10 – 2017-02-11 (×2): 1300 mg via ORAL
  Filled 2017-02-10 (×4): qty 2

## 2017-02-10 MED ORDER — INSULIN ASPART 100 UNIT/ML ~~LOC~~ SOLN
2.0000 [IU] | SUBCUTANEOUS | Status: DC
  Administered 2017-02-10 – 2017-02-11 (×4): 2 [IU] via SUBCUTANEOUS
  Filled 2017-02-10 (×4): qty 1

## 2017-02-10 MED ORDER — PANTOPRAZOLE SODIUM 40 MG IV SOLR
40.0000 mg | Freq: Two times a day (BID) | INTRAVENOUS | Status: DC
  Administered 2017-02-10 – 2017-02-15 (×10): 40 mg via INTRAVENOUS
  Filled 2017-02-10 (×10): qty 40

## 2017-02-10 MED ORDER — ACETAMINOPHEN 650 MG RE SUPP: 650.0000 mg | Freq: Four times a day (QID) | RECTAL | Status: DC | PRN

## 2017-02-10 MED ORDER — AMLODIPINE BESYLATE 5 MG PO TABS
5.0000 mg | ORAL_TABLET | Freq: Every day | ORAL | Status: DC
  Administered 2017-02-11 – 2017-02-14 (×4): 5 mg via ORAL
  Filled 2017-02-10 (×5): qty 1

## 2017-02-10 MED ORDER — CARVEDILOL 25 MG PO TABS
25.0000 mg | ORAL_TABLET | Freq: Two times a day (BID) | ORAL | Status: DC
  Administered 2017-02-11 – 2017-02-16 (×9): 25 mg via ORAL
  Filled 2017-02-10 (×9): qty 1

## 2017-02-10 MED ORDER — LEVOTHYROXINE SODIUM 50 MCG PO TABS
75.0000 ug | ORAL_TABLET | Freq: Every day | ORAL | Status: DC
  Administered 2017-02-11 – 2017-02-16 (×5): 75 ug via ORAL
  Filled 2017-02-10 (×5): qty 2

## 2017-02-10 MED ORDER — VITAMIN B-12 1000 MCG PO TABS
1000.0000 ug | ORAL_TABLET | Freq: Every day | ORAL | Status: DC
  Administered 2017-02-11 – 2017-02-14 (×4): 1000 ug via ORAL
  Filled 2017-02-10 (×4): qty 1

## 2017-02-10 MED ORDER — HEPARIN 1000 UNIT/ML FOR PERITONEAL DIALYSIS
500.0000 [IU] | INTRAMUSCULAR | Status: DC | PRN
  Filled 2017-02-10: qty 0.5

## 2017-02-10 MED ORDER — SODIUM CHLORIDE 0.9 % IV SOLN
INTRAVENOUS | Status: DC
  Administered 2017-02-10: 21:00:00 via INTRAVENOUS

## 2017-02-10 MED ORDER — CALCIUM ACETATE (PHOS BINDER) 667 MG PO CAPS
2001.0000 mg | ORAL_CAPSULE | Freq: Three times a day (TID) | ORAL | Status: DC
  Administered 2017-02-13 – 2017-02-16 (×3): 2001 mg via ORAL
  Filled 2017-02-10 (×7): qty 3

## 2017-02-10 MED ORDER — INSULIN ASPART 100 UNIT/ML ~~LOC~~ SOLN: 0.0000 [IU] | Freq: Three times a day (TID) | SUBCUTANEOUS | Status: DC

## 2017-02-10 MED ORDER — INSULIN GLARGINE 100 UNIT/ML ~~LOC~~ SOLN
20.0000 [IU] | Freq: Every day | SUBCUTANEOUS | Status: DC
  Filled 2017-02-10: qty 0.2

## 2017-02-10 MED ORDER — ADULT MULTIVITAMIN W/MINERALS CH
1.0000 | ORAL_TABLET | Freq: Every day | ORAL | Status: DC
  Administered 2017-02-10 – 2017-02-14 (×5): 1 via ORAL
  Filled 2017-02-10 (×5): qty 1

## 2017-02-10 MED ORDER — FUROSEMIDE 40 MG PO TABS
80.0000 mg | ORAL_TABLET | Freq: Two times a day (BID) | ORAL | Status: DC
  Administered 2017-02-11 – 2017-02-16 (×9): 80 mg via ORAL
  Filled 2017-02-10 (×3): qty 2
  Filled 2017-02-10: qty 4
  Filled 2017-02-10 (×2): qty 2
  Filled 2017-02-10: qty 4
  Filled 2017-02-10 (×2): qty 2

## 2017-02-10 MED ORDER — INSULIN ASPART 100 UNIT/ML ~~LOC~~ SOLN: 0.0000 [IU] | Freq: Every day | SUBCUTANEOUS | Status: DC

## 2017-02-10 MED ORDER — ORAL CARE MOUTH RINSE
15.0000 mL | Freq: Two times a day (BID) | OROMUCOSAL | Status: DC
  Administered 2017-02-10 – 2017-02-14 (×3): 15 mL via OROMUCOSAL

## 2017-02-10 MED ORDER — SODIUM CHLORIDE 0.9 % IV SOLN
10.0000 mL/h | Freq: Once | INTRAVENOUS | Status: AC
  Administered 2017-02-10: 10 mL/h via INTRAVENOUS

## 2017-02-10 MED ORDER — HYDROCODONE-ACETAMINOPHEN 5-325 MG PO TABS
1.0000 | ORAL_TABLET | ORAL | Status: DC | PRN
  Administered 2017-02-13: 2 via ORAL
  Administered 2017-02-13: 1 via ORAL
  Filled 2017-02-10 (×2): qty 2

## 2017-02-10 MED ORDER — GENTAMICIN SULFATE 0.1 % EX CREA
1.0000 "application " | TOPICAL_CREAM | Freq: Every day | CUTANEOUS | Status: DC
  Administered 2017-02-11 – 2017-02-13 (×2): 1 via TOPICAL
  Filled 2017-02-10: qty 15

## 2017-02-10 MED ORDER — ACETAMINOPHEN 325 MG PO TABS
650.0000 mg | ORAL_TABLET | Freq: Four times a day (QID) | ORAL | Status: DC | PRN
  Filled 2017-02-10: qty 2

## 2017-02-10 MED ORDER — HYDRALAZINE HCL 50 MG PO TABS
50.0000 mg | ORAL_TABLET | Freq: Two times a day (BID) | ORAL | Status: DC
  Administered 2017-02-10 – 2017-02-15 (×10): 50 mg via ORAL
  Filled 2017-02-10 (×10): qty 1

## 2017-02-10 MED ORDER — DELFLEX-LC/1.5% DEXTROSE 344 MOSM/L IP SOLN
INTRAPERITONEAL | Status: DC
  Administered 2017-02-10: 8 L via INTRAPERITONEAL
  Administered 2017-02-11: 10 L via INTRAPERITONEAL
  Administered 2017-02-12 – 2017-02-14 (×4): via INTRAPERITONEAL
  Filled 2017-02-10 (×5): qty 3000

## 2017-02-10 MED ORDER — SENNOSIDES-DOCUSATE SODIUM 8.6-50 MG PO TABS: 1.0000 | ORAL_TABLET | Freq: Every evening | ORAL | Status: DC | PRN

## 2017-02-10 NOTE — H&P (Signed)
[facility] NAME: Bethany Lee    MR#:  [id number]  DATE OF BIRTH:  Nov 30, 1950  DATE OF ADMISSION:  02/10/2017  PRIMARY CARE PHYSICIAN: Tracie Harrier, MD   REQUESTING/REFERRING PHYSICIAN: dr Jacqualine Code  CHIEF COMPLAINT:  Dark colored stools  HISTORY OF PRESENT ILLNESS:  Bethany Lee is a 66 y.o. female with a known history of ESRD on PD and recent discharge with GIB (unable to do EGD/colonscopy due to patient being on plavix), Recent revision of left arm brachiocephalic fistula banding of the AV access for elimination is still syndrome who presents with dark colored stools and feeling lightheaded and short of breath. Her hemoglobin has dropped to 4.6. It was 8.8 at her discharge on 7/11. She is taking aspirin and Plavix per patient denies abdominal pain. Patient denies coffee-ground emesis.  PAST MEDICAL HISTORY:   Past Medical History:  Diagnosis Date  . Anemia   . CHF (congestive heart failure) ([place])   . Chronic kidney disease    peritoneal dialysis  . Complication of anesthesia   . Coronary artery disease   . Diabetes mellitus without complication ([place])   . Dialysis patient [facility])    Peritoneal dialysis patient  . Dyspnea    doe  . Edema   . History of recent blood transfusion 01/2017  . Hypercholesterolemia   . Hypertension   . Hypothyroidism   . Neuropathy   . PONV (postoperative nausea and vomiting)   . Shingles    October-November 2017  . Tremors of nervous system    intermittent when taking gabapentin    PAST SURGICAL HISTORY:   Past Surgical History:  Procedure Laterality Date  . A/V SHUNT INTERVENTION N/A 01/16/2017   Procedure: A/V Shunt Intervention;  Surgeon: Katha Cabal, MD;  Location: [facility];  Service: Cardiovascular;  Laterality: N/A;  . A/V SHUNTOGRAM Left 01/16/2017   Procedure: A/V Fistulagram;  Surgeon: Katha Cabal, MD;  Location: [facility];  Service:  Cardiovascular;  Laterality: Left;  . ABDOMINAL HYSTERECTOMY    . AV FISTULA PLACEMENT Left 11/30/2016   Procedure: ARTERIOVENOUS (AV) FISTULA CREATION ( EXPLORE FOR CREATION BRACHIOCEPHALIC);  Surgeon: Algernon Huxley, MD;  Location: [facility];  Service: Vascular;  Laterality: Left;  . CAPD INSERTION N/A 11/25/2015   Procedure: LAPAROSCOPIC INSERTION CONTINUOUS AMBULATORY PERITONEAL DIALYSIS  (CAPD) CATHETER;  Surgeon: Algernon Huxley, MD;  Location: [facility];  Service: Vascular;  Laterality: N/A;  . CAPD INSERTION N/A 01/06/2016   Procedure: LAPAROSCOPIC INSERTION CONTINUOUS AMBULATORY PERITONEAL DIALYSIS  (CAPD) CATHETER REVISION ;  Surgeon: Algernon Huxley, MD;  Location: [facility];  Service: Vascular;  Laterality: N/A;  . CAPD INSERTION N/A 10/11/2016   Procedure: LAPAROSCOPIC INSERTION CONTINUOUS AMBULATORY PERITONEAL DIALYSIS  (CAPD) CATHETER ( REVISION );  Surgeon: Algernon Huxley, MD;  Location: [facility];  Service: Vascular;  Laterality: N/A;  . CARDIAC CATHETERIZATION    . CATARACT EXTRACTION W/PHACO Right 05/25/2016   Procedure: CATARACT EXTRACTION PHACO AND INTRAOCULAR LENS PLACEMENT (IOC);  Surgeon: Eulogio Bear, MD;  Location: [facility];  Service: Ophthalmology;  Laterality: Right;  Lot # [id number] H [place]: 00:5.2 AP%: 11.3 CDE: 7.21  . CORONARY ANGIOPLASTY    . CORONARY STENT INTERVENTION N/A 09/07/2016   Procedure: Coronary Stent Intervention;  Surgeon: Isaias Cowman, MD;  Location: [facility];  Service: Cardiovascular;  Laterality: N/A;  . DIALYSIS/PERMA CATHETER INSERTION N/A 09/21/2016   Procedure: Dialysis/Perma Catheter Insertion;  Surgeon: Algernon Huxley, MD;  Location: [facility];  Service: Cardiovascular;  Laterality: N/A;  . DIALYSIS/PERMA CATHETER REMOVAL Right 11/30/2016   Procedure: DIALYSIS/PERMA CATHETER REMOVAL;  Surgeon: Algernon Huxley, MD;  Location: [facility];  Service: Vascular;  Laterality: Right;  . EYE SURGERY    . LEFT HEART CATH AND CORONARY ANGIOGRAPHY Left  09/07/2016   Procedure: Left Heart Cath and Coronary Angiography;  Surgeon: Isaias Cowman, MD;  Location: [facility];  Service: Cardiovascular;  Laterality: Left;  . LIGATION OF ARTERIOVENOUS  FISTULA Left 02/07/2017   Procedure: LIGATION OF ARTERIOVENOUS  FISTULA ( BANDING BRACHIAL CEPHALIC );  Surgeon: Katha Cabal, MD;  Location: [facility];  Service: Vascular;  Laterality: Left;  . TUBAL LIGATION    . UPPER EXTREMITY ANGIOGRAPHY Left 01/19/2017   Procedure: Upper Extremity Angiography;  Surgeon: Katha Cabal, MD;  Location: [facility];  Service: Cardiovascular;  Laterality: Left;    SOCIAL HISTORY:   Social History  Substance Use Topics  . Smoking status: Former Smoker    Quit date: 10/09/2008  . Smokeless tobacco: Never Used  . Alcohol use No    FAMILY HISTORY:  Diabetes and dementia  DRUG ALLERGIES:   Allergies  Allergen Reactions  . Penicillins     Childhood loss of consciousness  Has patient had a PCN reaction causing immediate rash, facial/tongue/throat swelling, SOB or lightheadedness with hypotension: Yes Has patient had a PCN reaction causing severe rash involving mucus membranes or skin necrosis: No Has patient had a PCN reaction that required hospitalization Unknown Has patient had a PCN reaction occurring within the last 10 years: Unknown If all of the above answers are "NO", then may proceed with Cephalosporin use.   . Valacyclovir Nausea Only    REVIEW OF SYSTEMS:   Review of Systems  Constitutional: Negative.  Negative for chills, fever and malaise/fatigue.  HENT: Negative.  Negative for ear discharge, ear pain, hearing loss, nosebleeds and sore throat.   Eyes: Negative.  Negative for blurred vision and pain.  Respiratory: Positive for shortness of breath. Negative for cough, hemoptysis and wheezing.   Cardiovascular: Negative.  Negative for chest pain, palpitations and leg swelling.  Gastrointestinal: Positive for melena.  Negative for abdominal pain, blood in stool, diarrhea, nausea and vomiting.  Genitourinary: Negative.  Negative for dysuria.  Musculoskeletal: Negative.  Negative for back pain.  Skin: Negative.   Neurological: Negative for dizziness, tremors, speech change, focal weakness, seizures and headaches.  Endo/Heme/Allergies: Negative.  Does not bruise/bleed easily.  Psychiatric/Behavioral: Negative.  Negative for depression, hallucinations and suicidal ideas.    MEDICATIONS AT HOME:   Prior to Admission medications   Medication Sig Start Date End Date Taking? Authorizing Provider  amLODipine (NORVASC) 5 MG tablet Take 5 mg by mouth daily.    [provider]  aspirin EC 81 MG tablet Take 1 tablet (81 mg total) by mouth daily. Start taking after NOV 15 Patient taking differently: Take 81 mg by mouth daily.  05/26/15   Murlean Iba, MD  calcium acetate (PHOSLO) 667 MG capsule Take 2,001 mg by mouth 3 (three) times daily with meals.    [provider]  carvedilol (COREG) 12.5 MG tablet Take 1 tablet (12.5 mg total) by mouth 2 (two) times daily with a meal. Patient taking differently: Take 25 mg by mouth 2 (two) times daily with a meal.  07/20/16   Fritzi Mandes, MD  clopidogrel (PLAVIX) 75 MG tablet Take 1  tablet (75 mg total) by mouth daily with breakfast. 09/08/16   Paraschos, Sheppard Coil, MD  furosemide (LASIX) 80 MG tablet Take 80 mg by mouth 2 (two) times daily.    [provider]  gentamicin cream (GARAMYCIN) 0.1 % Apply 1 application topically daily. Patient taking differently: Apply 1 application topically daily. Catheter dressing changes 12/21/15   Theodoro Grist, MD  hydrALAZINE (APRESOLINE) 50 MG tablet Take 50 mg by mouth 2 (two) times daily.    [provider]  insulin aspart (NOVOLOG FLEXPEN) 100 UNIT/ML FlexPen Inject 4-8 Units into the skin 3 (three) times daily with meals. Sliding scale below 200 take 4 units, 200-250=6 units, >250= 8 units 09/13/15    [provider]  Insulin Glargine (TOUJEO SOLOSTAR) 300 UNIT/ML SOPN Inject 20 Units into the skin at bedtime. Patient taking differently: Inject 24 Units into the skin at bedtime.  12/21/15   Theodoro Grist, MD  levothyroxine (SYNTHROID, LEVOTHROID) 75 MCG tablet Take 75 mcg by mouth daily before breakfast.    [provider]  metolazone (ZAROXOLYN) 5 MG tablet Take 2.5 mg by mouth daily.     [provider]  Multiple Vitamins-Minerals (DIALYVITE 800/ULTRA D) TABS Take 1 tablet by mouth daily.    [provider]  pantoprazole (PROTONIX) 40 MG tablet Take 1 tablet (40 mg total) by mouth 2 (two) times daily. 01/31/17   Max Sane, MD  Pyridoxine HCl (VITAMIN B6 PO) Take by mouth.    [provider]  sodium bicarbonate 650 MG tablet Take 1,300 mg by mouth 2 (two) times daily.    [provider]  traMADol (ULTRAM) 50 MG tablet Take 1 tablet (50 mg total) by mouth every 8 (eight) hours as needed for moderate pain or severe pain. 02/07/17   Schnier, Dolores Lory, MD  vitamin B-12 (CYANOCOBALAMIN) 1000 MCG tablet Take 1,000 mcg by mouth daily.    [provider]      VITAL SIGNS:  Blood pressure (!) 132/44, pulse 84, temperature 99.3 F (37.4 C), temperature source Oral, resp. rate 20, height 5' (1.524 m), weight 67.1 kg (148 lb), SpO2 100 %.  PHYSICAL EXAMINATION:   Physical Exam  Constitutional: She is oriented to person, place, and time and well-developed, well-nourished, and in no distress. No distress.  HENT:  Head: Normocephalic.  Eyes: No scleral icterus.  Neck: Normal range of motion. Neck supple. No JVD present. No tracheal deviation present.  Cardiovascular: Normal rate, regular rhythm and normal heart sounds.  Exam reveals no gallop and no friction rub.   No murmur heard. Pulmonary/Chest: Effort normal and breath sounds normal. No respiratory distress. She has no wheezes. She has no rales. She exhibits no tenderness.   Abdominal: Soft. Bowel sounds are normal. She exhibits no distension and no mass. There is no tenderness. There is no rebound and no guarding.  Musculoskeletal: Normal range of motion. She exhibits no edema.  Neurological: She is alert and oriented to person, place, and time.  Skin: Skin is warm. No rash noted. No erythema.  Left arm with scar from recent revision of AV graft  Psychiatric: Affect and judgment normal.      LABORATORY PANEL:   CBC  Recent Labs Lab 02/10/17 1613  WBC 7.6  HGB 4.6*  HCT 13.4*  PLT 129*   ------------------------------------------------------------------------------------------------------------------  Chemistries   Recent Labs Lab 02/10/17 1613  NA 135  K 5.4*  CL 96*  CO2 24  GLUCOSE 185*  BUN 115*  CREATININE 14.01*  CALCIUM 7.3*  AST 12*  ALT 11*  ALKPHOS 32*  BILITOT 0.7   ------------------------------------------------------------------------------------------------------------------  Cardiac Enzymes No results for input(s): TROPONINI in the last 168 hours. ------------------------------------------------------------------------------------------------------------------  RADIOLOGY:  No results found.  EKG:   pending  IMRESSION AND PLAN:   66 year old female with history of end-stage renal disease on peritoneal dialysis who was recently admitted to the hospital at the beginning of July for GI bleed and did not have endoscopy due to being on Plavix who presents with black dark-colored stools.  1. Upper GI bleed: Stop aspirin and Plavix. Start Protonix 40 IV twice a day GI consult requested. She will need EGD and colonoscopy during this hospital stay.  2. Acute on chronic anemia: Patient is consented for blood transfusion. She will have 2 units PRBCs. Follow hemoglobin afterward Continue PPI Order anemia panel  3. ESRD on peritoneal dialysis: Nephrology consult requested and discussed case with Dr. Holley Raring who  will arrange PD tonight  4. Diabetes: Patient is nothing by mouth and therefore will hold outpatient medications Start sliding scale  5. Essential hypertension: Continue Norvasc and Coreg and hydralazine  6. Hypothyroid: Continue Synthroid     All the records are reviewed and case discussed with ED provider. Management plans discussed with the patient and she is in agreement  CODE STATUS: full  TOTAL TIME TAKING CARE OF THIS PATIENT: 45 minutes.    Hedwig Mcfall M.D on 02/10/2017 at 5:22 PM  Between 7am to 6pm - Pager - [phone]  After 6pm go to [email or web address] - password [email or web address]  Office  [phone]  CC: Primary care physician; Tracie Harrier, MD

## 2017-02-10 NOTE — ED Notes (Signed)
Patient is alert, calm, and cooperative at this time. Plan of care discussed with patient. Husband is in the cafeteria.

## 2017-02-10 NOTE — Consult Note (Signed)
Name: Bethany Lee MRN: [id number] DOB: 1951-02-10    ADMISSION DATE:  02/10/2017  CONSULTATION DATE: 02/10/17  REFERRING MD : Dr. Benjie Karvonen  CHIEF COMPLAINT:  Dark Colored Stool  BRIEF PATIENT DESCRIPTION: 66 year old with recurrent GIB,  SIGNIFICANT EVENTS  7/21 Patient admitted with GIB.  Hgb of 4.6.   STUDIES:  07/17/16 ECHO>>The cavity size was normal. There was severe  concentric hypertrophy. Systolic function was mildly reduced. The  estimated ejection fraction was in the range of 45% to 50%.   HISTORY OF PRESENT ILLNESS:  Bethany Lee is a 66 year old female with known history of CHF,CAD,ESRD on PD, HTN ,HLD and hypothyriodism. Patient was admitted from 7/9- 7/11 for GIB and was unable to get EGD/colonoscopy as the patient was on plavix.  Upon discharge her HgB  Was 8.8mg /dl. She had revision of her left arm brachiocephalic fistula  On 6/26 and had her lab work done.  She was called and told that her Hgb was low and needs to be evaluated therefore the patient came to the ED.   Patient states that she had one episode of dark maroon colored stool on 7/21 am.  Patient was emergently transfused two units of blood .  She was sent to the stepdown unit for closer monitoring.  PAST MEDICAL HISTORY :   has a past medical history of Anemia; CHF (congestive heart failure) ([place]); Chronic kidney disease; Complication of anesthesia; Coronary artery disease; Diabetes mellitus without complication ([place]); Dialysis patient [facility]); Dyspnea; Edema; History of recent blood transfusion (01/2017); Hypercholesterolemia; Hypertension; Hypothyroidism; Neuropathy; PONV (postoperative nausea and vomiting); Shingles; and Tremors of nervous system.  has a past surgical history that includes Abdominal hysterectomy; Tubal ligation; Eye surgery; CAPD insertion (N/A, 11/25/2015); CAPD insertion (N/A, 01/06/2016); Cataract extraction w/PHACO (Right, 05/25/2016); Left Heart Cath and Coronary Angiography (Left, 09/07/2016);  Coronary Stent Intervention (N/A, 09/07/2016); Dialysis/Perma Catheter Insertion (N/A, 09/21/2016); CAPD insertion (N/A, 10/11/2016); Cardiac catheterization; Coronary angioplasty; AV fistula placement (Left, 11/30/2016); Dialysis/Perma Catheter Removal (Right, 11/30/2016); A/V Fistulagram (Left, 01/16/2017); A/V Shunt Intervention (N/A, 01/16/2017); Upper Extremity Angiography (Left, 01/19/2017); and Ligation of arteriovenous  fistula (Left, 02/07/2017). Prior to Admission medications   Medication Sig Start Date End Date Taking? Authorizing Provider  albuterol (PROVENTIL HFA;VENTOLIN HFA) 108 (90 Base) MCG/ACT inhaler Inhale 2 puffs into the lungs every 6 (six) hours as needed for wheezing or shortness of breath.   Yes [provider]  amLODipine (NORVASC) 5 MG tablet Take 5 mg by mouth daily.   Yes [provider]  aspirin EC 81 MG tablet Take 1 tablet (81 mg total) by mouth daily. Start taking after NOV 15 Patient taking differently: Take 81 mg by mouth daily.  05/26/15  Yes Murlean Iba, MD  carvedilol (COREG) 12.5 MG tablet Take 1 tablet (12.5 mg total) by mouth 2 (two) times daily with a meal. Patient taking differently: Take 25 mg by mouth 2 (two) times daily with a meal.  07/20/16  Yes Fritzi Mandes, MD  clopidogrel (PLAVIX) 75 MG tablet Take 1 tablet (75 mg total) by mouth daily with breakfast. 09/08/16  Yes Paraschos, Alexander, MD  furosemide (LASIX) 80 MG tablet Take 80 mg by mouth 2 (two) times daily.   Yes [provider]  gentamicin cream (GARAMYCIN) 0.1 % Apply 1 application topically daily. Patient taking differently: Apply 1 application topically daily. Catheter dressing changes 12/21/15  Yes Theodoro Grist, MD  hydrALAZINE (APRESOLINE) 50 MG tablet Take 50 mg by mouth 2 (two)  times daily.   Yes [provider]  Insulin Glargine (TOUJEO SOLOSTAR) 300 UNIT/ML SOPN Inject 20 Units into the skin at bedtime. Patient taking differently: Inject 24 Units into the skin  at bedtime.  12/21/15  Yes Theodoro Grist, MD  lovastatin (MEVACOR) 40 MG tablet Take 40 mg by mouth at bedtime.   Yes [provider]  metolazone (ZAROXOLYN) 5 MG tablet Take 5 mg by mouth daily.    Yes [provider]  Multiple Vitamins-Minerals (DIALYVITE 800/ULTRA D) TABS Take 1 tablet by mouth daily.   Yes [provider]  pantoprazole (PROTONIX) 40 MG tablet Take 1 tablet (40 mg total) by mouth 2 (two) times daily. 01/31/17  Yes Max Sane, MD  Pyridoxine HCl (VITAMIN B6 PO) Take by mouth.   Yes [provider]  traMADol (ULTRAM) 50 MG tablet Take 1 tablet (50 mg total) by mouth every 8 (eight) hours as needed for moderate pain or severe pain. 02/07/17  Yes Schnier, Dolores Lory, MD  vitamin B-12 (CYANOCOBALAMIN) 1000 MCG tablet Take 1,000 mcg by mouth daily.   Yes [provider]  calcium acetate (PHOSLO) 667 MG capsule Take 2,001 mg by mouth 3 (three) times daily with meals.    [provider]  insulin aspart (NOVOLOG FLEXPEN) 100 UNIT/ML FlexPen Inject 4-8 Units into the skin 3 (three) times daily with meals. Sliding scale below 200 take 4 units, 200-250=6 units, >250= 8 units 09/13/15   [provider]  levothyroxine (SYNTHROID, LEVOTHROID) 75 MCG tablet Take 75 mcg by mouth daily before breakfast.    [provider]   Allergies  Allergen Reactions  . Penicillins     Childhood loss of consciousness  Has patient had a PCN reaction causing immediate rash, facial/tongue/throat swelling, SOB or lightheadedness with hypotension: Yes Has patient had a PCN reaction causing severe rash involving mucus membranes or skin necrosis: No Has patient had a PCN reaction that required hospitalization Unknown Has patient had a PCN reaction occurring within the last 10 years: Unknown If all of the above answers are "NO", then may proceed with Cephalosporin use.   . Valacyclovir Nausea Only    FAMILY HISTORY:  family history is not on  file. SOCIAL HISTORY:  reports that she quit smoking about 8 years ago. She has never used smokeless tobacco. She reports that she does not drink alcohol or use drugs.  REVIEW OF SYSTEMS:   Constitutional: Negative for fever, chills, weight loss, malaise/fatigue and diaphoresis.  HENT: Negative for hearing loss, ear pain, nosebleeds, congestion, sore throat, neck pain, tinnitus and ear discharge.   Eyes: Negative for blurred vision, double vision, photophobia, pain, discharge and redness.  Respiratory: Negative for cough, hemoptysis, sputum production, shortness of breath, wheezing and stridor.   Cardiovascular: Negative for chest pain, palpitations, orthopnea, claudication, leg swelling and PND.  Gastrointestinal: Negative for heartburn, nausea, vomiting, abdominal pain, diarrhea, constipation, blood in stool and melena.  Genitourinary: Negative for dysuria, urgency, frequency, hematuria and flank pain.  Musculoskeletal: Negative for myalgias, back pain, joint pain and falls.  Skin: Negative for itching and rash.  Neurological: Negative for dizziness, tingling, tremors, sensory change, speech change, focal weakness, seizures, loss of consciousness, weakness and headaches.  Endo/Heme/Allergies: Negative for environmental allergies and polydipsia. Does not bruise/bleed easily.  SUBJECTIVE: Patient states that "she was short of breath and had a bowel movement earlier this morning with blood ."  VITAL SIGNS: Temp:  [99.2 F (37.3 C)-99.3 F (37.4 C)] 99.2 F (37.3 C) (  07/21 1753) Pulse Rate:  [82-88] 88 (07/21 1945) Resp:  [18-25] 18 (07/21 1945) BP: (114-143)/(43-82) 114/82 (07/21 1945) SpO2:  [100 %] 100 % (07/21 1945) Weight:  [148 lb (67.1 kg)] 148 lb (67.1 kg) (07/21 1614)  PHYSICAL EXAMINATION: General:  66 year old female, in no acute distress Neuro: Awake, Alert and oriented HEENT:  AT,[place],No JVD Cardiovascular:  S1S2,Regular, no m/r/g Lungs:Clear bilaterally, no  wheezes,crackles,rhonchi  Abdomen: soft,distended,NT Musculoskeletal:  No edema, cyanosis  Skin: left arm fistula,+Bruit, warm,dry and intact   Recent Labs Lab 02/06/17 1452 02/07/17 1315 02/10/17 1613  NA 140 138 135  K 5.3* 5.5* 5.4*  CL 102  --  96*  CO2 25  --  24  BUN 78*  --  115*  CREATININE 14.08*  --  14.01*  GLUCOSE 162* 90 185*    Recent Labs Lab 02/06/17 1452 02/07/17 1315 02/09/17 1302 02/10/17 1613  HGB 8.0* 6.8* 5.8* 4.6*  HCT 23.9* 20.0*  --  13.4*  WBC 7.6  --   --  7.6  PLT 163  --   --  129*   No results found.  ASSESSMENT / PLAN:  Upper GIB Symptomatic Anemia ESRD on PD Hx of Hypothyroidism Hx of Hypertension Hx Of DM  Plan Keep NPO for now GI consulted Transfused 2 units of blood Trend H/H Continue Amlodipine /coreg/Hydralzine Trend H/H Will transfuse if Hgb<7 Continue Protonix  Nephrology consulted for PD Blood glucose checks with SSI coverage Continue Synthroid    Cozy Veale,AG-ACNP Pulmonary and [place]   02/10/2017, 8:12 PM

## 2017-02-10 NOTE — ED Provider Notes (Signed)
[facility] Emergency Department Provider Note   ____________________________________________   First MD Initiated Contact with Patient 02/10/17 1638     (approximate)  I have reviewed the triage vital signs and the nursing notes.   HISTORY  Chief Complaint GI Bleeding   HPI Bethany Lee is a 66 y.o. female here for evaluation of bleeding and low blood count  Patient reports low blood count admission for possible bleeding a few days ago. Saw her doctor yesterday for recheck, there notified today of a lower blood count.  Patient reports she's had one loose black stool  She has continued to have fatigue. Denies pain. No fevers or chills. No nausea or vomiting.  Continues to use Plavix  Had a recent GI bleed, was admitted from the hospital. She sees her primary nephrologist is a on peritoneal dialysis    Past Medical History:  Diagnosis Date  . Anemia   . CHF (congestive heart failure) ([place])   . Chronic kidney disease    peritoneal dialysis  . Complication of anesthesia   . Coronary artery disease   . Diabetes mellitus without complication ([place])   . Dialysis patient [facility])    Peritoneal dialysis patient  . Dyspnea    doe  . Edema   . History of recent blood transfusion 01/2017  . Hypercholesterolemia   . Hypertension   . Hypothyroidism   . Neuropathy   . PONV (postoperative nausea and vomiting)   . Shingles    October-November 2017  . Tremors of nervous system    intermittent when taking gabapentin    Patient Active Problem List   Diagnosis Date Noted  . GIB (gastrointestinal bleeding) 02/10/2017  . Acute blood loss anemia   . 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 Essential hypertension 02/02/2017  . GI bleeding 01/29/2017  . Acute respiratory distress   . Acute respiratory failure ([place]) 09/21/2016  . CAD (coronary artery disease) 09/07/2016  . Acute pulmonary edema ([place]) 07/16/2016  . ESRD on  dialysis ([place]) 12/21/2015  . Peritoneal dialysis status ([place]) 12/21/2015  . Leg swelling 12/21/2015  . Anemia of chronic disease 12/21/2015  . Diabetes mellitus ([place]) 12/21/2015  . Hyperkalemia 12/21/2015  . 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 Anemia in chronic renal disease 10/04/2015  . Proteinuria 05/25/2015    Past Surgical History:  Procedure Laterality Date  . A/V SHUNT INTERVENTION N/A 01/16/2017   Procedure: A/V Shunt Intervention;  Surgeon: Katha Cabal, MD;  Location: [place] [facility];  Service: Cardiovascular;  Laterality: N/A;  . A/V SHUNTOGRAM Left 01/16/2017   Procedure: A/V Fistulagram;  Surgeon: Katha Cabal, MD;  Location: [place] [facility];  Service: Cardiovascular;  Laterality: Left;  . ABDOMINAL HYSTERECTOMY    . AV FISTULA PLACEMENT Left 11/30/2016   Procedure: ARTERIOVENOUS (AV) FISTULA CREATION ( EXPLORE FOR CREATION BRACHIOCEPHALIC);  Surgeon: Algernon Huxley, MD;  Location: [facility] ORS;  Service: Vascular;  Laterality: Left;  . CAPD INSERTION N/A 11/25/2015   Procedure: LAPAROSCOPIC INSERTION CONTINUOUS AMBULATORY PERITONEAL DIALYSIS  (CAPD) CATHETER;  Surgeon: Algernon Huxley, MD;  Location: [facility] ORS;  Service: Vascular;  Laterality: N/A;  . CAPD INSERTION N/A 01/06/2016   Procedure: LAPAROSCOPIC INSERTION CONTINUOUS AMBULATORY PERITONEAL DIALYSIS  (CAPD) CATHETER REVISION ;  Surgeon: Algernon Huxley, MD;  Location: [facility] OR[facility];  Service: Vascular;  Laterality: N/A;  . CAPD INSERTION N/A 10/11/2016   Procedure: LAPAROSCOPIC INSERTION CONTINUOUS AMBULATORY PERITONEAL DIALYSIS  (CAPD) CATHETER ( REVISION );  Surgeon: Algernon Huxley, MD;  Location: [facility] OR[facility];  Service: Vascular;  Laterality: N/A;  . CARDIAC CATHETERIZATION    . CATARACT EXTRACTION W/PHACO Right 05/25/2016   Procedure: CATARACT EXTRACTION PHACO AND INTRAOCULAR LENS PLACEMENT (IOC);  Surgeon: Eulogio Bear, MD;  Location: [facility] OR[facility];  Service: Ophthalmology;  Laterality: Right;  Lot # [id number] H [place]: 00:5.2 AP%:  11.3 CDE: 7.21  . CORONARY ANGIOPLASTY    . CORONARY STENT INTERVENTION N/A 09/07/2016   Procedure: Coronary Stent Intervention;  Surgeon: Isaias Cowman, MD;  Location: [place] [facility];  Service: Cardiovascular;  Laterality: N/A;  . DIALYSIS/PERMA CATHETER INSERTION N/A 09/21/2016   Procedure: Dialysis/Perma Catheter Insertion;  Surgeon: Algernon Huxley, MD;  Location: [place] [facility];  Service: Cardiovascular;  Laterality: N/A;  . DIALYSIS/PERMA CATHETER REMOVAL Right 11/30/2016   Procedure: DIALYSIS/PERMA CATHETER REMOVAL;  Surgeon: Algernon Huxley, MD;  Location: [facility];  Service: Vascular;  Laterality: Right;  . EYE SURGERY    . LEFT HEART CATH AND CORONARY ANGIOGRAPHY Left 09/07/2016   Procedure: Left Heart Cath and Coronary Angiography;  Surgeon: Isaias Cowman, MD;  Location: [place] [facility];  Service: Cardiovascular;  Laterality: Left;  . LIGATION OF ARTERIOVENOUS  FISTULA Left 02/07/2017   Procedure: LIGATION OF ARTERIOVENOUS  FISTULA ( BANDING BRACHIAL CEPHALIC );  Surgeon: Katha Cabal, MD;  Location: [facility];  Service: Vascular;  Laterality: Left;  . TUBAL LIGATION    . UPPER EXTREMITY ANGIOGRAPHY Left 01/19/2017   Procedure: Upper Extremity Angiography;  Surgeon: Katha Cabal, MD;  Location: [place] [facility];  Service: Cardiovascular;  Laterality: Left;    Prior to Admission medications   Medication Sig Start Date End Date Taking? Authorizing Provider  albuterol (PROVENTIL HFA;VENTOLIN HFA) 108 (90 Base) MCG/ACT inhaler Inhale 2 puffs into the lungs every 6 (six) hours as needed for wheezing or shortness of breath.   Yes [provider]  amLODipine (NORVASC) 5 MG tablet Take 5 mg by mouth daily.   Yes [provider]  aspirin EC 81 MG tablet Take 1 tablet (81 mg total) by mouth daily. Start taking after NOV 15 Patient taking differently: Take 81 mg by mouth daily.  05/26/15  Yes Murlean Iba, MD  carvedilol (COREG) 12.5 MG tablet  Take 1 tablet (12.5 mg total) by mouth 2 (two) times daily with a meal. Patient taking differently: Take 25 mg by mouth 2 (two) times daily with a meal.  07/20/16  Yes Fritzi Mandes, MD  clopidogrel (PLAVIX) 75 MG tablet Take 1 tablet (75 mg total) by mouth daily with breakfast. 09/08/16  Yes Paraschos, Alexander, MD  furosemide (LASIX) 80 MG tablet Take 80 mg by mouth 2 (two) times daily.   Yes [provider]  gentamicin cream (GARAMYCIN) 0.1 % Apply 1 application topically daily. Patient taking differently: Apply 1 application topically daily. Catheter dressing changes 12/21/15  Yes Theodoro Grist, MD  hydrALAZINE (APRESOLINE) 50 MG tablet Take 50 mg by mouth 2 (two) times daily.   Yes [provider]  Insulin Glargine (TOUJEO SOLOSTAR) 300 UNIT/ML SOPN Inject 20 Units into the skin at bedtime. Patient taking differently: Inject 24 Units into the skin at bedtime.  12/21/15  Yes Theodoro Grist, MD  lovastatin (MEVACOR) 40 MG tablet Take 40 mg by mouth at bedtime.   Yes [provider]  metolazone (ZAROXOLYN) 5 MG tablet Take 5 mg by mouth daily.    Yes [provider]  Multiple Vitamins-Minerals (DIALYVITE 800/ULTRA D)  TABS Take 1 tablet by mouth daily.   Yes [provider]  pantoprazole (PROTONIX) 40 MG tablet Take 1 tablet (40 mg total) by mouth 2 (two) times daily. 01/31/17  Yes Max Sane, MD  Pyridoxine HCl (VITAMIN B6 PO) Take by mouth.   Yes [provider]  traMADol (ULTRAM) 50 MG tablet Take 1 tablet (50 mg total) by mouth every 8 (eight) hours as needed for moderate pain or severe pain. 02/07/17  Yes Schnier, Dolores Lory, MD  vitamin B-12 (CYANOCOBALAMIN) 1000 MCG tablet Take 1,000 mcg by mouth daily.   Yes [provider]  calcium acetate (PHOSLO) 667 MG capsule Take 2,001 mg by mouth 3 (three) times daily with meals.    [provider]  insulin aspart (NOVOLOG FLEXPEN) 100 UNIT/ML FlexPen Inject 4-8 Units into the skin 3  (three) times daily with meals. Sliding scale below 200 take 4 units, 200-250=6 units, >250= 8 units 09/13/15   [provider]  levothyroxine (SYNTHROID, LEVOTHROID) 75 MCG tablet Take 75 mcg by mouth daily before breakfast.    [provider]    Allergies Penicillins and Valacyclovir  History reviewed. No pertinent family history.  Social History Social History  Substance Use Topics  . Smoking status: Former Smoker    Quit date: 10/09/2008  . Smokeless tobacco: Never Used  . Alcohol use No    Review of Systems Constitutional: No fever/chills Eyes: No visual changes. ENT: No sore throat. Cardiovascular: Denies chest pain. Respiratory: Denies shortness of breath. Gastrointestinal: No abdominal pain.  No nausea, no vomiting.  No diarrhea.  No constipation. Genitourinary: Negative for dysuria. Musculoskeletal: Negative for back pain. Some swelling left hand after surgery over her fistula. This is been present since the time surgery Skin: Negative for rash. Neurological: Negative for headaches, focal weakness or numbness.    ____________________________________________   PHYSICAL EXAM:  VITAL SIGNS: ED Triage Vitals [02/10/17 1614]  Enc Vitals Group     BP (!) 128/46     Pulse Rate 82     Resp 20     Temp 99.3 F (37.4 C)     Temp Source Oral     SpO2 100 %     Weight 148 lb (67.1 kg)     Height 5' (1.524 m)     Head Circumference      Peak Flow      Pain Score 0     Pain Loc      Pain Edu?      Excl. in [place]?     Constitutional: Alert and oriented. Well appearing and in no acute distress. Eyes: Conjunctivae are Pale Head: Atraumatic. Nose: No congestion/rhinnorhea. Mouth/Throat: Mucous membranes are moist. Neck: No stridor.   Cardiovascular: Normal rate, regular rhythm. Grossly normal heart sounds.  Good peripheral circulation. Respiratory: Normal respiratory effort.  No retractions. Lungs CTAB. Gastrointestinal: Soft and nontender. No  distention. Rectal exam demonstrates dark school with slight mixed blood in it. Very heme positive Musculoskeletal: No lower extremity tenderness nor edema. Left upper extremity scar just below the antecubital space. Clean dry and intact. His fingers and the hand well. Normal radial pulse. Neurologic:  Normal speech and language. No gross focal neurologic deficits are appreciated.  Skin:  Skin is warm, dry and intact. No rash noted. Psychiatric: Mood and affect are normal. Speech and behavior are normal.  ____________________________________________   LABS (all labs ordered are listed, but only abnormal results are displayed)  Labs Reviewed  COMPREHENSIVE METABOLIC PANEL -  Abnormal; Notable for the following:       Result Value   Potassium 5.4 (*)    Chloride 96 (*)    Glucose, Bld 185 (*)    BUN 115 (*)    Creatinine, Ser 14.01 (*)    Calcium 7.3 (*)    Total Protein 5.0 (*)    Albumin 2.5 (*)    AST 12 (*)    ALT 11 (*)    Alkaline Phosphatase 32 (*)    GFR calc non Af Amer 2 (*)    GFR calc Af Amer 3 (*)    All other components within normal limits  CBC - Abnormal; Notable for the following:    RBC 1.45 (*)    Hemoglobin 4.6 (*)    HCT 13.4 (*)    RDW 18.3 (*)    Platelets 129 (*)    All other components within normal limits  IRON AND TIBC - Abnormal; Notable for the following:    TIBC 238 (*)    Saturation Ratios 56 (*)    All other components within normal limits  FERRITIN - Abnormal; Notable for the following:    Ferritin 401 (*)    All other components within normal limits  RETICULOCYTES - Abnormal; Notable for the following:    Retic Ct Pct 5.4 (*)    RBC. 1.48 (*)    All other components within normal limits  GLUCOSE, CAPILLARY - Abnormal; Notable for the following:    Glucose-Capillary 129 (*)    All other components within normal limits  MRSA PCR SCREENING  PROTIME-INR  FOLATE  VITAMIN J81  BASIC METABOLIC PANEL  CBC  HEMOGLOBIN A1C  POC OCCULT  BLOOD, ED  TYPE AND SCREEN  PREPARE RBC (CROSSMATCH)   ____________________________________________  EKG   ____________________________________________  RADIOLOGY  No results found.  ____________________________________________   PROCEDURES  Procedure(s) performed: None  Procedures  Critical Care performed: Yes, see critical care note(s)  CRITICAL CARE Performed by: Delman Kitten   Total critical care time: 40 minutes  Critical care time was exclusive of separately billable procedures and treating other patients.  Critical care was necessary to treat or prevent imminent or life-threatening deterioration.  Critical care was time spent personally by me on the following activities: development of treatment plan with patient and/or surrogate as well as nursing, discussions with consultants, evaluation of patient's response to treatment, examination of patient, obtaining history from patient or surrogate, ordering and performing treatments and interventions, ordering and review of laboratory studies, ordering and review of radiographic studies, pulse oximetry and re-evaluation of patient's condition.  ____________________________________________   INITIAL IMPRESSION / ASSESSMENT AND PLAN / ED COURSE  Pertinent labs & imaging results that were available during my care of the patient were reviewed by me and considered in my medical decision making (see chart for details).  Patient presents for concerns of low blood count, fatigue. Concerning for GI bleeding. Hemoglobin critically low. Discussed with gastroenterology, Dr. Modena Nunnery who recommends that since the patient is continuing Plavix to hold that, does not recommend tag red cell study, rather recommends admission, blood transfusion and GI will see patient in the morning  Discussed case with Dr. Holley Raring, he recommends against platelet transfusion at this time. He is aware and will write for the patient's appropriate dialysis  orders.  Patient and her husband agreeable with plan for admission      ____________________________________________   FINAL CLINICAL IMPRESSION(S) / ED DIAGNOSES  Final diagnoses:  Gastrointestinal hemorrhage, unspecified  gastrointestinal hemorrhage type  Acute blood loss anemia      NEW MEDICATIONS STARTED DURING THIS VISIT:  Current Discharge Medication List       Note:  This document was prepared using Dragon voice recognition software and may include unintentional dictation errors.     Delman Kitten, MD 02/10/17 2135

## 2017-02-10 NOTE — ED Notes (Signed)
Dr. Jacqualine Code at bedside to perform rectal exam and hemoccult.

## 2017-02-10 NOTE — ED Notes (Signed)
Dr. Jacqualine Code aware of hematocrit of 13.4

## 2017-02-10 NOTE — ED Notes (Signed)
Patient signed blood consent

## 2017-02-10 NOTE — ED Notes (Signed)
Attempted to call report. RN not available.

## 2017-02-10 NOTE — ED Notes (Signed)
Attempted to call report, RN unavailable.

## 2017-02-10 NOTE — ED Notes (Addendum)
Rate of blood infusion increased to 147m/hr

## 2017-02-10 NOTE — Progress Notes (Signed)
PD STARTED

## 2017-02-10 NOTE — ED Notes (Signed)
Patient placed on 2L O2 for dyspnea.

## 2017-02-10 NOTE — ED Triage Notes (Signed)
Pt presents to ED c/o feeling lightheaded and black tarry stools. Pt recently seen by vascular surgeon for fistula repair, hgb 7/17 8.0, decreased on 7/18 to 6.8. Had hemoglobin rechecked at outpatient lab yesterday (result 5.8). Pt's spouse extremity agitated, verbally aggressive with staff. States that he is frustrated that every time pt has a surgery she needs blood transfusions. Pt takes plavix d/t cardiac stents placed in February 2018.

## 2017-02-10 NOTE — ED Notes (Signed)
Dr. Jacqualine Code aware of critical value results Hemoglobin 4.6.

## 2017-02-11 ENCOUNTER — Encounter: Payer: Self-pay | Admitting: Gastroenterology

## 2017-02-11 ENCOUNTER — Inpatient Hospital Stay: Payer: Medicare HMO

## 2017-02-11 DIAGNOSIS — K922 Gastrointestinal hemorrhage, unspecified: Secondary | ICD-10-CM

## 2017-02-11 DIAGNOSIS — K921 Melena: Principal | ICD-10-CM

## 2017-02-11 DIAGNOSIS — D62 Acute posthemorrhagic anemia: Secondary | ICD-10-CM

## 2017-02-11 LAB — BASIC METABOLIC PANEL
Anion gap: 14 (ref 5–15)
BUN: 119 mg/dL — ABNORMAL HIGH (ref 6–20)
CHLORIDE: 101 mmol/L (ref 101–111)
CO2: 22 mmol/L (ref 22–32)
CREATININE: 13.5 mg/dL — AB (ref 0.44–1.00)
Calcium: 7.2 mg/dL — ABNORMAL LOW (ref 8.9–10.3)
GFR, EST AFRICAN AMERICAN: 3 mL/min — AB (ref >60)
GFR, EST NON AFRICAN AMERICAN: 2 mL/min — AB (ref >60)
Glucose, Bld: 121 mg/dL — ABNORMAL HIGH (ref 65–99)
POTASSIUM: 4.7 mmol/L (ref 3.5–5.1)
SODIUM: 137 mmol/L (ref 135–145)

## 2017-02-11 LAB — CBC
HCT: 28.6 % — ABNORMAL LOW (ref 35.0–47.0)
Hemoglobin: 10 g/dL — ABNORMAL LOW (ref 12.0–16.0)
MCH: 29.7 pg (ref 26.0–34.0)
MCHC: 35.1 g/dL (ref 32.0–36.0)
MCV: 84.6 fL (ref 80.0–100.0)
PLATELETS: 110 10*3/uL — AB (ref 150–440)
RBC: 3.38 MIL/uL — AB (ref 3.80–5.20)
RDW: 17.3 % — ABNORMAL HIGH (ref 11.5–14.5)
WBC: 9.1 10*3/uL (ref 3.6–11.0)

## 2017-02-11 LAB — HEMOGLOBIN AND HEMATOCRIT, BLOOD
HCT: 19.1 % — ABNORMAL LOW (ref 35.0–47.0)
HEMOGLOBIN: 6.6 g/dL — AB (ref 12.0–16.0)

## 2017-02-11 LAB — GLUCOSE, CAPILLARY
GLUCOSE-CAPILLARY: 133 mg/dL — AB (ref 65–99)
GLUCOSE-CAPILLARY: 132 mg/dL — AB (ref 65–99)
GLUCOSE-CAPILLARY: 99 mg/dL (ref 65–99)
Glucose-Capillary: 101 mg/dL — ABNORMAL HIGH (ref 65–99)
Glucose-Capillary: 124 mg/dL — ABNORMAL HIGH (ref 65–99)

## 2017-02-11 MED ORDER — SODIUM CHLORIDE 0.9 % IV SOLN
Freq: Once | INTRAVENOUS | Status: AC
  Administered 2017-02-11: 03:00:00 via INTRAVENOUS

## 2017-02-11 NOTE — Anesthesia Postprocedure Evaluation (Signed)
Anesthesia Post Note  Patient: Bethany Lee  Procedure(s) Performed: Procedure(s) (LRB): LIGATION OF ARTERIOVENOUS  FISTULA ( BANDING BRACHIAL CEPHALIC ) (Left)  Patient location during evaluation: PACU Anesthesia Type: General Level of consciousness: awake and alert Pain management: pain level controlled Vital Signs Assessment: post-procedure vital signs reviewed and stable Respiratory status: spontaneous breathing, nonlabored ventilation, respiratory function stable and patient connected to nasal cannula oxygen Cardiovascular status: blood pressure returned to baseline and stable Postop Assessment: no signs of nausea or vomiting Anesthetic complications: no     Last Vitals:  Vitals:   02/07/17 1531 02/07/17 1533  BP: (!) 119/44 (!) 117/48  Pulse: 71   Resp: 16 16  Temp:  36.6 C    Last Pain:  Vitals:   02/08/17 1048  TempSrc:   PainSc: 0-No pain                 Molli Barrows

## 2017-02-11 NOTE — Progress Notes (Signed)
Bethany Lee NAME: Bethany Lee    MR#:  [id number]  DATE OF BIRTH:  1950-10-19  SUBJECTIVE:    REVIEW OF SYSTEMS:   ROS Tolerating Diet: Tolerating PT:   DRUG ALLERGIES:   Allergies  Allergen Reactions  . Penicillins     Childhood loss of consciousness  Has patient had a PCN reaction causing immediate rash, facial/tongue/throat swelling, SOB or lightheadedness with hypotension: Yes Has patient had a PCN reaction causing severe rash involving mucus membranes or skin necrosis: No Has patient had a PCN reaction that required hospitalization Unknown Has patient had a PCN reaction occurring within the last 10 years: Unknown If all of the above answers are "NO", then may proceed with Cephalosporin use.   . Valacyclovir Nausea Only    VITALS:  Blood pressure (!) 125/47, pulse 77, temperature 98.7 F (37.1 C), temperature source Oral, resp. rate 15, height 5' (1.524 m), weight 71.6 kg (157 lb 13.6 oz), SpO2 100 %.  PHYSICAL EXAMINATION:   Physical Exam  GENERAL:  66 y.o.-year-old patient lying in the bed with no acute distress.  EYES: Pupils equal, round, reactive to light and accommodation. No scleral icterus. Extraocular muscles intact.  HEENT: Head atraumatic, normocephalic. Oropharynx and nasopharynx clear.  NECK:  Supple, no jugular venous distention. No thyroid enlargement, no tenderness.  LUNGS: Normal breath sounds bilaterally, no wheezing, rales, rhonchi. No use of accessory muscles of respiration.  CARDIOVASCULAR: S1, S2 normal. No murmurs, rubs, or gallops.  ABDOMEN: Soft, nontender, nondistended. Bowel sounds present. No organomegaly or mass.  EXTREMITIES: No cyanosis, clubbing or edema b/l.    NEUROLOGIC: Cranial nerves II through XII are intact. No focal Motor or sensory deficits b/l.   PSYCHIATRIC:  patient is alert and oriented x 3.  SKIN: No obvious rash, lesion, or ulcer.   LABORATORY PANEL:   CBC  Recent Labs Lab 02/11/17 0929  WBC 9.1  HGB 10.0*  HCT 28.6*  PLT 110*    Chemistries   Recent Labs Lab 02/10/17 1613 02/11/17 0929  NA 135 137  K 5.4* 4.7  CL 96* 101  CO2 24 22  GLUCOSE 185* 121*  BUN 115* 119*  CREATININE 14.01* 13.50*  CALCIUM 7.3* 7.2*  AST 12*  --   ALT 11*  --   ALKPHOS 32*  --   BILITOT 0.7  --    Cardiac Enzymes No results for input(s): TROPONINI in the last 168 hours. RADIOLOGY:  Dg Chest Port 1 View  Result Date: 02/11/2017 CLINICAL DATA:  Cough for 6 months, history CHF, coronary artery disease post stenting, diabetes mellitus, hypertension, former smoker EXAM: PORTABLE CHEST 1 VIEW COMPARISON:  Portable exam 1206 hours compared 03/23/2017 FINDINGS: Enlargement of cardiac silhouette. Atherosclerotic calcification aorta with stent at a proximal great vessel new since previous study. Mediastinal contours and pulmonary vascularity normal. Improved pulmonary edema since previous exam. Minimal peribronchial thickening. No definite infiltrate, pleural effusion or pneumothorax. Bones demineralized. IMPRESSION: Enlargement of cardiac silhouette with improved pulmonary edema since prior study. No acute abnormalities. Aortic Atherosclerosis (ICD10-I70.0). Electronically Signed   By: Lavonia Dana M.D.   On: 02/11/2017 12:33   ASSESSMENT AND PLAN:    66 year old female with history of end-stage renal disease on peritoneal dialysis who was recently admitted to the hospital at the beginning of July for GI bleed and did not have endoscopy due to being on Plavix who presents with black dark-colored stools.  1. Upper GI bleed: -  Stop aspirin and Plavix. -Continue Protonix 40 IV twice a day -GI consult requested. She will need EGD and colonoscopy during this hospital stay.  2. Acute on chronic anemia/acute blood loss anemia Patient came in with hemoglobin of 5.8--- 4.6----4 unit blood transfusion----10 Continue PPI  3. ESRD on peritoneal  dialysis: Nephrology consult requested and discussed with Dr. Holley Raring - Patient's peritoneal dialysis has been resumed  4. Diabetes: Patient is nothing by mouth and therefore will hold outpatient medications Start sliding scale  5. Essential hypertension: Continue Norvasc and Coreg and hydralazine  6. Hypothyroid: Continue Synthroid  7. Chronic cough -Chest x-ray ordered shows cardiomegaly no pneumonia.   Discussed with husband at bedside Case discussed with Care Management/Social Worker. Management plans discussed with the patient, family and they are in agreement.  CODE STATUS: Full DVT Prophylaxis: SCD TOTAL TIME TAKING CARE OF THIS PATIENT: 25 minutes.  >50% time spent on counselling and coordination of care  POSSIBLE D/C IN 2-3 DAYS, DEPENDING ON CLINICAL CONDITION.  Note: This dictation was prepared with Dragon dictation along with smaller phrase technology. Any transcriptional errors that result from this process are unintentional.  Gianlucas Evenson M.D on 02/11/2017 at 1:25 PM  Between 7am to 6pm - Pager - [phone]  After 6pm go to [email or web address] - password [email or web address]  Office  [phone]  CC: Primary care physician; Tracie Harrier, MD

## 2017-02-11 NOTE — Progress Notes (Signed)
Report  Given to nurse Leigh on [place]. Prepare for transfer via chair. Room 216

## 2017-02-11 NOTE — Progress Notes (Signed)
[place], [place] 02/11/17  Subjective:  Patient well known to [place]. Dialysis center called [place] yesterday afternoon informing us that her hemoglobin was 5.8. Thereafter patient was advised to come to the emergency department. Upon presentation here hemoglobin was even lower at 4.6. She has now received 4 units of blood and hemoglobin is up to 10.0. Patient was also placed on peritoneal dialysis overnight. Patient endorses having melena.  Objective:  Vital signs in last 24 hours:  Temp:  [97.9 F (36.6 C)-99.3 F (37.4 C)] 98.3 F (36.8 C) (07/22 0820) Pulse Rate:  [73-88] 80 (07/22 1000) Resp:  [15-25] 16 (07/22 1000) BP: (114-159)/(43-82) 143/56 (07/22 1000) SpO2:  [99 %-100 %] 100 % (07/22 1000) Weight:  [66.9 kg (147 lb 7.8 oz)-71.6 kg (157 lb 13.6 oz)] 71.6 kg (157 lb 13.6 oz) (07/22 0500)  Weight change:  Filed Weights   02/10/17 1614 02/10/17 2015 02/11/17 0500  Weight: 67.1 kg (148 lb) 66.9 kg (147 lb 7.8 oz) 71.6 kg (157 lb 13.6 oz)    Intake/Output:    Intake/Output Summary (Last 24 hours) at 02/11/17 1038 Last data filed at 02/11/17 0820  Gross per 24 hour  Intake             2045 ml  Output                0 ml  Net             2045 ml     Physical Exam: General: No acute distress, Laying in bed   HEENT Anicteric, moist oral mucous membranes  Neck supple  Pulm/lungs Clear to auscultation, Normal effort   CVS/Heart irregular rhythm, no rub or gallop  Abdomen:  Soft, nontender  Extremities: No edema  Neurologic: Alert, oriented, and able to answer questions  Skin: No acute rashes  Access: PD catheter, left upper extremity developing AV fistula       Basic Metabolic Panel:   Recent Labs Lab 02/06/17 1452 02/07/17 1315 02/10/17 1613 02/11/17 0929  NA 140 138 135 137  K 5.3* 5.5* 5.4* 4.7  CL 102  --  96* 101  CO2 25  --  24 22  GLUCOSE 162* 90 185* 121*  BUN 78*  --  115* 119*  CREATININE 14.08*  --  14.01*  13.50*  CALCIUM 6.9*  --  7.3* 7.2*     CBC:  Recent Labs Lab 02/06/17 1452 02/07/17 1315 02/09/17 1302 02/10/17 1613 02/11/17 0014 02/11/17 0929  WBC 7.6  --   --  7.6  --  9.1  HGB 8.0* 6.8* 5.8* 4.6* 6.6* 10.0*  HCT 23.9* 20.0*  --  13.4* 19.1* 28.6*  MCV 91.5  --   --  92.8  --  84.6  PLT 163  --   --  129*  --  110*      Lab Results  Component Value Date   HEPBSAG Negative 01/30/2017   HEPBSAB Reactive 01/30/2017   HEPBIGM Negative 12/19/2015      Microbiology:  Recent Results (from the past 240 hour(s))  Surgical pcr screen     Status: None   Collection Time: 02/06/17  2:52 PM  Result Value Ref Range Status   MRSA, PCR NEGATIVE NEGATIVE Final   Staphylococcus aureus NEGATIVE NEGATIVE Final    Comment:        The Xpert SA Assay (FDA approved for NASAL specimens in patients over 18 years of age), is one component of a  comprehensive surveillance program.  Test performance has been validated by [facility] for patients greater than or equal to 52 year old. It is not intended to diagnose infection nor to guide or monitor treatment.   MRSA PCR Screening     Status: None   Collection Time: 02/10/17  8:20 PM  Result Value Ref Range Status   MRSA by PCR NEGATIVE NEGATIVE Final    Comment:        The GeneXpert MRSA Assay (FDA approved for NASAL specimens only), is one component of a comprehensive MRSA colonization surveillance program. It is not intended to diagnose MRSA infection nor to guide or monitor treatment for MRSA infections.     Coagulation Studies:  Recent Labs  02/10/17 1613  LABPROT 13.9  INR 1.07    Urinalysis: No results for input(s): COLORURINE, LABSPEC, PHURINE, GLUCOSEU, HGBUR, BILIRUBINUR, KETONESUR, PROTEINUR, UROBILINOGEN, NITRITE, LEUKOCYTESUR in the last 72 hours.  Invalid input(s): APPERANCEUR    Imaging: No results found.   Medications:       Assessment/ Plan:  66 y.o. female with end-stage renal  disease on peritoneal dialysis, congestive heart failure, coronary disease, diabetes, hypertension, hyperlipidemia, hypothyroidism. PMD: Dr Ginette Pitman Dialysis: [address]: Dr Kolluru/ CCPD 5 x 1700 cc ; tidal 85%  1.  ESRD on PD. Patient was resumed on peritoneal dialysis last night. She will be taken off of the machine later this a.m. Continue current PD prescription using 1.5% dextrose solution.  2. Severe anemia- Anemia of CKD and blood loss Presenting hemoglobin was 4.6. Patient status post 4 units of PRBC transfusion. Awaiting further inform from gastroenterology.  3. Secondary hyperparathyroidism Continue to periodically monitor phosphorus.  4. Hypertension. Continue current doses of amlodipine, carvedilol, and hydralazine.    LOS: 1 Destini Cambre 7/22/201810:38 [address] [place], [place]

## 2017-02-11 NOTE — Consult Note (Addendum)
Reason for Consult:Melena, GI blood loss anemia Referring Physician: Dr. Ludwig Lean Bethany Lee is an 66 y.o. female.  HPI: Seen in consultation at the request of Dr. Posey Pronto for GI blood loss anemia. The history is obtained through the patient, her husband at the bedside, and review of her electronic medical record. She has CHF, CAD, ESRD on peritoneal dialysis, hypertension, hyperlipidemia, and hypothyroidism.  She was admitted 01/29/17-01/31/17 for a GI bleed on Plavix. Dr. Vicente Males recommended endoscopy after a 4-5 day Plavix washout.  Her hemoglobin was 8.8 on discharge. She had an outpatient appointment with gastroenterology next month. Both episodes of symptomatic anemia have occurred after procedures surrounding her AV fistula.   She has had intermittent dark stools since then. However, yesterday she became dizzy and lightheaded. Her hemoglobin was 5.8 and nadired at 4.6. She received 4 units of PRCs and her hemoglobin is now 10.   She normally has a bowel movement every day to every other day. No other associated symptoms. No identified exacerbating or relieving features.  No dysphagia, odynophagia, nausea, abdominal pain, hematemesis, heartburn, hematochezia, or BRBPR. GI ROS is otherwise negative. She uses aa daily ASA but denies the use of any NSAIDs.  She had a colonoscopy with Dr. Candace Cruise 10/29/14 showed a 2cm sigmoid tubulovillous adenoma and a descending colon tubular adenoma. Surveillance colonoscopy recommended in 3 years.   A CT abd/pelvis 05/04/16 for low back pain showed ascites, a small hiatal hernia, and a redundant sigmoid.   Past Medical History:  Diagnosis Date  . Anemia   . CHF (congestive heart failure) ([place])   . Chronic kidney disease    peritoneal dialysis  . Complication of anesthesia   . Coronary artery disease   . Diabetes mellitus without complication ([place])   . Dialysis patient [facility])    Peritoneal dialysis patient  . Dyspnea    doe  . Edema   . History of recent blood  transfusion 01/2017  . Hypercholesterolemia   . Hypertension   . Hypothyroidism   . Neuropathy   . PONV (postoperative nausea and vomiting)   . Shingles    October-November 2017  . Tremors of nervous system    intermittent when taking gabapentin    Past Surgical History:  Procedure Laterality Date  . A/V SHUNT INTERVENTION N/A 01/16/2017   Procedure: A/V Shunt Intervention;  Surgeon: Katha Cabal, MD;  Location: [place] [facility];  Service: Cardiovascular;  Laterality: N/A;  . A/V SHUNTOGRAM Left 01/16/2017   Procedure: A/V Fistulagram;  Surgeon: Katha Cabal, MD;  Location: [place] [facility];  Service: Cardiovascular;  Laterality: Left;  . ABDOMINAL HYSTERECTOMY    . AV FISTULA PLACEMENT Left 11/30/2016   Procedure: ARTERIOVENOUS (AV) FISTULA CREATION ( EXPLORE FOR CREATION BRACHIOCEPHALIC);  Surgeon: Algernon Huxley, MD;  Location: [facility];  Service: Vascular;  Laterality: Left;  . CAPD INSERTION N/A 11/25/2015   Procedure: LAPAROSCOPIC INSERTION CONTINUOUS AMBULATORY PERITONEAL DIALYSIS  (CAPD) CATHETER;  Surgeon: Algernon Huxley, MD;  Location: [facility];  Service: Vascular;  Laterality: N/A;  . CAPD INSERTION N/A 01/06/2016   Procedure: LAPAROSCOPIC INSERTION CONTINUOUS AMBULATORY PERITONEAL DIALYSIS  (CAPD) CATHETER REVISION ;  Surgeon: Algernon Huxley, MD;  Location: [facility];  Service: Vascular;  Laterality: N/A;  . CAPD INSERTION N/A 10/11/2016   Procedure: LAPAROSCOPIC INSERTION CONTINUOUS AMBULATORY PERITONEAL DIALYSIS  (CAPD) CATHETER ( REVISION );  Surgeon: Algernon Huxley, MD;  Location: [facility];  Service: Vascular;  Laterality: N/A;  .  CARDIAC CATHETERIZATION    . CATARACT EXTRACTION W/PHACO Right 05/25/2016   Procedure: CATARACT EXTRACTION PHACO AND INTRAOCULAR LENS PLACEMENT (IOC);  Surgeon: Eulogio Bear, MD;  Location: [facility];  Service: Ophthalmology;  Laterality: Right;  Lot # [id number] H [place]: 00:5.2 AP%: 11.3 CDE: 7.21  . CORONARY ANGIOPLASTY    . CORONARY STENT  INTERVENTION N/A 09/07/2016   Procedure: Coronary Stent Intervention;  Surgeon: Isaias Cowman, MD;  Location: [place] [facility];  Service: Cardiovascular;  Laterality: N/A;  . DIALYSIS/PERMA CATHETER INSERTION N/A 09/21/2016   Procedure: Dialysis/Perma Catheter Insertion;  Surgeon: Algernon Huxley, MD;  Location: [place] [facility];  Service: Cardiovascular;  Laterality: N/A;  . DIALYSIS/PERMA CATHETER REMOVAL Right 11/30/2016   Procedure: DIALYSIS/PERMA CATHETER REMOVAL;  Surgeon: Algernon Huxley, MD;  Location: [facility];  Service: Vascular;  Laterality: Right;  . EYE SURGERY    . LEFT HEART CATH AND CORONARY ANGIOGRAPHY Left 09/07/2016   Procedure: Left Heart Cath and Coronary Angiography;  Surgeon: Isaias Cowman, MD;  Location: [place] [facility];  Service: Cardiovascular;  Laterality: Left;  . LIGATION OF ARTERIOVENOUS  FISTULA Left 02/07/2017   Procedure: LIGATION OF ARTERIOVENOUS  FISTULA ( BANDING BRACHIAL CEPHALIC );  Surgeon: Katha Cabal, MD;  Location: [facility];  Service: Vascular;  Laterality: Left;  . TUBAL LIGATION    . UPPER EXTREMITY ANGIOGRAPHY Left 01/19/2017   Procedure: Upper Extremity Angiography;  Surgeon: Katha Cabal, MD;  Location: [place] [facility];  Service: Cardiovascular;  Laterality: Left;    History reviewed. No pertinent family history.  Social History:  reports that she quit smoking about 8 years ago. She has never used smokeless tobacco. She reports that she does not drink alcohol or use drugs.  Allergies:  Allergies  Allergen Reactions  . Penicillins     Childhood loss of consciousness  Has patient had a PCN reaction causing immediate rash, facial/tongue/throat swelling, SOB or lightheadedness with hypotension: Yes Has patient had a PCN reaction causing severe rash involving mucus membranes or skin necrosis: No Has patient had a PCN reaction that required hospitalization Unknown Has patient had a PCN reaction occurring within the last  10 years: Unknown If all of the above answers are "NO", then may proceed with Cephalosporin use.   . Valacyclovir Nausea Only    Medications:  I have reviewed the patient's current medications. Prior to Admission:  Prescriptions Prior to Admission  Medication Sig Dispense Refill Last Dose  . albuterol (PROVENTIL HFA;VENTOLIN HFA) 108 (90 Base) MCG/ACT inhaler Inhale 2 puffs into the lungs every 6 (six) hours as needed for wheezing or shortness of breath.   PRN at PRN  . amLODipine (NORVASC) 5 MG tablet Take 5 mg by mouth daily.   02/10/2017 at Unknown time  . aspirin EC 81 MG tablet Take 1 tablet (81 mg total) by mouth daily. Start taking after NOV 15 (Patient taking differently: Take 81 mg by mouth daily. ) 30 tablet 0 02/10/2017 at Unknown time  . carvedilol (COREG) 12.5 MG tablet Take 1 tablet (12.5 mg total) by mouth 2 (two) times daily with a meal. (Patient taking differently: Take 25 mg by mouth 2 (two) times daily with a meal. ) 60 tablet 0 02/10/2017 at Unknown time  . clopidogrel (PLAVIX) 75 MG tablet Take 1 tablet (75 mg total) by mouth daily with breakfast. 30 tablet 6 02/10/2017 at Unknown time  . furosemide (LASIX) 80 MG tablet Take 80 mg by mouth 2 (two) times  daily.   02/10/2017 at Unknown time  . gentamicin cream (GARAMYCIN) 0.1 % Apply 1 application topically daily. (Patient taking differently: Apply 1 application topically daily. Catheter dressing changes) 15 g 0 prn at prn  . hydrALAZINE (APRESOLINE) 50 MG tablet Take 50 mg by mouth 2 (two) times daily.   02/10/2017 at Unknown time  . Insulin Glargine (TOUJEO SOLOSTAR) 300 UNIT/ML SOPN Inject 20 Units into the skin at bedtime. (Patient taking differently: Inject 24 Units into the skin at bedtime. ) 30 pen 5 02/09/2017 at 2000  . lovastatin (MEVACOR) 40 MG tablet Take 40 mg by mouth at bedtime.   02/09/2017 at 2000  . metolazone (ZAROXOLYN) 5 MG tablet Take 5 mg by mouth daily.    PRN at PRN  . Multiple Vitamins-Minerals (DIALYVITE  800/ULTRA D) TABS Take 1 tablet by mouth daily.   unknown at unknown  . pantoprazole (PROTONIX) 40 MG tablet Take 1 tablet (40 mg total) by mouth 2 (two) times daily. 60 tablet 0 02/10/2017 at 0800  . Pyridoxine HCl (VITAMIN B6 PO) Take by mouth.   unknown at unknown  . traMADol (ULTRAM) 50 MG tablet Take 1 tablet (50 mg total) by mouth every 8 (eight) hours as needed for moderate pain or severe pain. 40 tablet 0 PRN at PRN  . vitamin B-12 (CYANOCOBALAMIN) 1000 MCG tablet Take 1,000 mcg by mouth daily.   unknown at unknown  . calcium acetate (PHOSLO) 667 MG capsule Take 2,001 mg by mouth 3 (three) times daily with meals.   Not Taking at Unknown time  . insulin aspart (NOVOLOG FLEXPEN) 100 UNIT/ML FlexPen Inject 4-8 Units into the skin 3 (three) times daily with meals. Sliding scale below 200 take 4 units, 200-250=6 units, >250= 8 units   Not Taking at Unknown time  . levothyroxine (SYNTHROID, LEVOTHROID) 75 MCG tablet Take 75 mcg by mouth daily before breakfast.   Not Taking at Unknown time   Scheduled: . amLODipine  5 mg Oral Daily  . calcium acetate  2,001 mg Oral TID WC  . carvedilol  25 mg Oral BID WC  . furosemide  80 mg Oral BID  . gentamicin cream  1 application Topical Daily  . hydrALAZINE  50 mg Oral BID  . insulin aspart  2-6 Units Subcutaneous Q4H  . levothyroxine  75 mcg Oral QAC breakfast  . mouth rinse  15 mL Mouth Rinse BID  . multivitamin with minerals  1 tablet Oral Daily  . pantoprazole (PROTONIX) IV  40 mg Intravenous Q12H  . vitamin B-12  1,000 mcg Oral Daily   Continuous: . dialysis solution 1.5% low-MG/low-CA     RKY:[id number] **OR** acetaminophen, heparin, HYDROcodone-acetaminophen, senna-docusate  Results for orders placed or performed during the hospital encounter of 02/10/17 (from the past 48 hour(s))  Comprehensive metabolic panel     Status: Abnormal   Collection Time: 02/10/17  4:13 PM  Result Value Ref Range   Sodium 135 135 - 145 mmol/L   Potassium  5.4 (H) 3.5 - 5.1 mmol/L   Chloride 96 (L) 101 - 111 mmol/L   CO2 24 22 - 32 mmol/L   Glucose, Bld 185 (H) 65 - 99 mg/dL   BUN 115 (H) 6 - 20 mg/dL    Comment: RESULT CONFIRMED BY MANUAL DILUTION. TCH.   Creatinine, Ser 14.01 (H) 0.44 - 1.00 mg/dL   Calcium 7.3 (L) 8.9 - 10.3 mg/dL   Total Protein 5.0 (L) 6.5 - 8.1 g/dL   Albumin 2.5 (L)  3.5 - 5.0 g/dL   AST 12 (L) 15 - 41 U/L   ALT 11 (L) 14 - 54 U/L   Alkaline Phosphatase 32 (L) 38 - 126 U/L   Total Bilirubin 0.7 0.3 - 1.2 mg/dL   GFR calc non Af Amer 2 (L) >60 mL/min   GFR calc Af Amer 3 (L) >60 mL/min    Comment: (NOTE) The eGFR has been calculated using the CKD EPI equation. This calculation has not been validated in all clinical situations. eGFR's persistently <60 mL/min signify possible Chronic Kidney Disease.    Anion gap 15 5 - 15  CBC     Status: Abnormal   Collection Time: 02/10/17  4:13 PM  Result Value Ref Range   WBC 7.6 3.6 - 11.0 K/uL   RBC 1.45 (L) 3.80 - 5.20 MIL/uL   Hemoglobin 4.6 (LL) 12.0 - 16.0 g/dL    Comment: CRITICAL RESULT CALLED TO, READ BACK BY AND VERIFIED WITH: SONJA WEAVER ON 02/10/17 AT 1652 [place]    HCT 13.4 (LL) 35.0 - 47.0 %    Comment: CRITICAL RESULT CALLED TO, READ BACK BY AND VERIFIED WITH: SONJA WEAVER ON 02/10/17 AT [address] [place]    MCV 92.8 80.0 - 100.0 fL   MCH 31.8 26.0 - 34.0 pg   MCHC 34.3 32.0 - 36.0 g/dL   RDW 18.3 (H) 11.5 - 14.5 %   Platelets 129 (L) 150 - 440 K/uL  Type and screen [facility]     Status: None (Preliminary result)   Collection Time: 02/10/17  4:13 PM  Result Value Ref Range   ABO/RH(D) A POS    Antibody Screen NEG    Sample Expiration 02/13/2017    Unit Number [id number]    Blood Component Type RBC, LR IRR    Unit division 00    Status of Unit ISSUED,FINAL    Transfusion Status OK TO TRANSFUSE    Crossmatch Result Compatible    Unit Number [id number]    Blood Component Type RBC, LR IRR    Unit division 00    Status of Unit  ISSUED,FINAL    Transfusion Status OK TO TRANSFUSE    Crossmatch Result Compatible    Unit Number [id number]    Blood Component Type RED CELLS,LR    Unit division 00    Status of Unit ISSUED    Transfusion Status OK TO TRANSFUSE    Crossmatch Result Compatible    Unit Number [id number]    Blood Component Type RED CELLS,LR    Unit division 00    Status of Unit ISSUED    Transfusion Status OK TO TRANSFUSE    Crossmatch Result Compatible    Unit Number [id number]    Blood Component Type RED CELLS,LR    Unit division 00    Status of Unit ALLOCATED    Transfusion Status OK TO TRANSFUSE    Crossmatch Result Compatible    Unit Number [id number]    Blood Component Type RED CELLS,LR    Unit division 00    Status of Unit REL FROM [facility]    Transfusion Status OK TO TRANSFUSE    Crossmatch Result Compatible    Unit Number [id number]    Blood Component Type RED CELLS,LR    Unit division 00    Status of Unit REL FROM [facility]    Transfusion Status OK TO TRANSFUSE    Crossmatch Result Compatible    Unit Number [id number]    Blood Component Type RBC,  LR IRR    Unit division 00    Status of Unit ALLOCATED    Transfusion Status OK TO TRANSFUSE    Crossmatch Result Compatible   Protime-INR     Status: None   Collection Time: 02/10/17  4:13 PM  Result Value Ref Range   Prothrombin Time 13.9 11.4 - 15.2 seconds   INR 1.07   Prepare RBC     Status: None   Collection Time: 02/10/17  5:02 PM  Result Value Ref Range   Order Confirmation ORDER PROCESSED BY BLOOD BANK   Folate     Status: None   Collection Time: 02/10/17  5:18 PM  Result Value Ref Range   Folate 31.0 >5.9 ng/mL  Iron and TIBC     Status: Abnormal   Collection Time: 02/10/17  5:18 PM  Result Value Ref Range   Iron 133 28 - 170 ug/dL   TIBC 238 (L) 250 - 450 ug/dL   Saturation Ratios 56 (H) 10.4 - 31.8 %   UIBC 105 ug/dL  Ferritin     Status: Abnormal   Collection Time: 02/10/17  5:18 PM  Result Value  Ref Range   Ferritin 401 (H) 11 - 307 ng/mL  Reticulocytes     Status: Abnormal   Collection Time: 02/10/17  5:18 PM  Result Value Ref Range   Retic Ct Pct 5.4 (H) 0.4 - 3.1 %   RBC. 1.48 (L) 3.80 - 5.20 MIL/uL   Retic Count, Absolute 79.9 19.0 - 183.0 K/uL  Vitamin B12     Status: Abnormal   Collection Time: 02/10/17  6:35 PM  Result Value Ref Range   Vitamin B-12 >7,500 (H) 180 - 914 pg/mL    Comment: (NOTE) This assay is not validated for testing neonatal or myeloproliferative syndrome specimens for Vitamin B12 levels. Performed at [facility], [place] [address], [place], [place] [address]   Glucose, capillary     Status: Abnormal   Collection Time: 02/10/17  8:16 PM  Result Value Ref Range   Glucose-Capillary 129 (H) 65 - 99 mg/dL  MRSA PCR Screening     Status: None   Collection Time: 02/10/17  8:20 PM  Result Value Ref Range   MRSA by PCR NEGATIVE NEGATIVE    Comment:        The GeneXpert MRSA Assay (FDA approved for NASAL specimens only), is one component of a comprehensive MRSA colonization surveillance program. It is not intended to diagnose MRSA infection nor to guide or monitor treatment for MRSA infections.   Glucose, capillary     Status: Abnormal   Collection Time: 02/10/17 11:27 PM  Result Value Ref Range   Glucose-Capillary 145 (H) 65 - 99 mg/dL  Hemoglobin and hematocrit, blood     Status: Abnormal   Collection Time: 02/11/17 12:14 AM  Result Value Ref Range   Hemoglobin 6.6 (L) 12.0 - 16.0 g/dL   HCT 19.1 (L) 35.0 - 47.0 %  Prepare RBC     Status: None   Collection Time: 02/11/17  2:30 AM  Result Value Ref Range   Order Confirmation ORDER PROCESSED BY BLOOD BANK   Glucose, capillary     Status: Abnormal   Collection Time: 02/11/17  3:57 AM  Result Value Ref Range   Glucose-Capillary 133 (H) 65 - 99 mg/dL  Glucose, capillary     Status: Abnormal   Collection Time: 02/11/17  7:53 AM  Result Value Ref Range   Glucose-Capillary 132 (H) 65 -  99 mg/dL  Basic metabolic panel     Status: Abnormal   Collection Time: 02/11/17  9:29 AM  Result Value Ref Range   Sodium 137 135 - 145 mmol/L   Potassium 4.7 3.5 - 5.1 mmol/L   Chloride 101 101 - 111 mmol/L   CO2 22 22 - 32 mmol/L   Glucose, Bld 121 (H) 65 - 99 mg/dL   BUN 119 (H) 6 - 20 mg/dL    Comment: RESULT CONFIRMED BY MANUAL DILUTION [facility]   Creatinine, Ser 13.50 (H) 0.44 - 1.00 mg/dL   Calcium 7.2 (L) 8.9 - 10.3 mg/dL   GFR calc non Af Amer 2 (L) >60 mL/min   GFR calc Af Amer 3 (L) >60 mL/min    Comment: (NOTE) The eGFR has been calculated using the CKD EPI equation. This calculation has not been validated in all clinical situations. eGFR's persistently <60 mL/min signify possible Chronic Kidney Disease.    Anion gap 14 5 - 15  CBC     Status: Abnormal   Collection Time: 02/11/17  9:29 AM  Result Value Ref Range   WBC 9.1 3.6 - 11.0 K/uL   RBC 3.38 (L) 3.80 - 5.20 MIL/uL   Hemoglobin 10.0 (L) 12.0 - 16.0 g/dL   HCT 28.6 (L) 35.0 - 47.0 %   MCV 84.6 80.0 - 100.0 fL   MCH 29.7 26.0 - 34.0 pg   MCHC 35.1 32.0 - 36.0 g/dL   RDW 17.3 (H) 11.5 - 14.5 %   Platelets 110 (L) 150 - 440 K/uL  Glucose, capillary     Status: Abnormal   Collection Time: 02/11/17 11:38 AM  Result Value Ref Range   Glucose-Capillary 101 (H) 65 - 99 mg/dL  Glucose, capillary     Status: Abnormal   Collection Time: 02/11/17  4:28 PM  Result Value Ref Range   Glucose-Capillary 124 (H) 65 - 99 mg/dL    Dg Chest Port 1 View  Result Date: 02/11/2017 CLINICAL DATA:  Cough for 6 months, history CHF, coronary artery disease post stenting, diabetes mellitus, hypertension, former smoker EXAM: PORTABLE CHEST 1 VIEW COMPARISON:  Portable exam 1206 hours compared 03/23/2017 FINDINGS: Enlargement of cardiac silhouette. Atherosclerotic calcification aorta with stent at a proximal great vessel new since previous study. Mediastinal contours and pulmonary vascularity normal. Improved pulmonary edema since previous  exam. Minimal peribronchial thickening. No definite infiltrate, pleural effusion or pneumothorax. Bones demineralized. IMPRESSION: Enlargement of cardiac silhouette with improved pulmonary edema since prior study. No acute abnormalities. Aortic Atherosclerosis (ICD10-I70.0). Electronically Signed   By: Lavonia Dana M.D.   On: 02/11/2017 12:33    Review of Systems  Constitutional: Positive for malaise/fatigue. Negative for chills and fever.  HENT: Negative for hearing loss and tinnitus.   Eyes: Negative for blurred vision and double vision.  Respiratory: Positive for shortness of breath.   Cardiovascular: Negative for chest pain and palpitations.  Gastrointestinal: Positive for melena. Negative for abdominal pain, blood in stool, constipation, diarrhea, heartburn, nausea and vomiting.  Genitourinary: Negative for dysuria and hematuria.  Musculoskeletal: Negative for myalgias and neck pain.  Skin: Negative for itching and rash.  Neurological: Negative for dizziness and headaches.  Psychiatric/Behavioral: Negative for depression. The patient is not nervous/anxious.    Blood pressure (!) 130/50, pulse 77, temperature 98.2 F (36.8 C), temperature source Oral, resp. rate 14, height 5' (1.524 m), weight 157 lb 13.6 oz (71.6 kg), SpO2 98 %. Physical Exam  Nursing note and vitals reviewed. Constitutional: She is  oriented to person, place, and time. She appears well-developed and well-nourished.  HENT:  Head: Normocephalic and atraumatic.  Eyes: Conjunctivae are normal. No scleral icterus.  Neck: Neck supple. No thyromegaly present.  Cardiovascular: Normal rate and regular rhythm.   Respiratory: Effort normal and breath sounds normal.  GI: Soft. Bowel sounds are normal. She exhibits no distension. There is no tenderness. There is no rebound and no guarding.  Musculoskeletal: Normal range of motion. She exhibits no edema.  Neurological: She is alert and oriented to person, place, and time.  Skin:  Skin is dry. No rash noted.  Psychiatric: She has a normal mood and affect. Thought content normal.    Assessment/Plan: GI blood loss anemia presenting with melena Plavix and ASA BUN higher than baseline despite creatinine at baseline ESRD on PD CAD on Plavix History of colon polyps    - 2 cm sigmoid TVA and descending TA on colonoscopy 10/29/14 with Dr. Candace Cruise    - surveillance recommended in 3 years  Etiology of GI blood loss anemia presenting with melena likely due to an upper source. Will plan EGD +/- colonoscopy after a 5 day Plavix washout, placing the procedure on 02/15/17 (Last dose 02/11/15). The patient and her husband are interested in having a concurrent colonoscopy while she is off of Plavix.  Plan serial hgb/hct with transfusion as necessary, empiric PPI therapy, and clear liquid diet.  We discussed the risks, benefits, and alternatives to endoscopic evaluation at this time. In particular, we discussed the risks that include, but are not limited to, reaction to medication, cardiopulmonary compromise, bleeding requiring blood transfusion, aspiration resulting in pneumonia, perforation, and even death. She acknowledges these risks and asks that we proceed.    Thank you for allowing me to participate in Mrs. Riden's care. Please call with any questions or concerns.   [place] 02/11/2017, 5:12 PM

## 2017-02-11 NOTE — Progress Notes (Signed)
PD COMPLETED

## 2017-02-11 NOTE — Progress Notes (Signed)
A&O. VSS. On room air.  Denies pain. Denies abdominal cramps. Had one small black hard stool on BSC this afternoon. No evidence of bleeding. Total 4 units PRBC's since admitted. Last Hgb 10. On clear liquid diet after seen by GI. GI plans for EGD/ colonoscopy after plavix held 5days. PD dialysis off now and will restart this evening. PD cath site with occlusive dressing. Left arm developing fistula with good bruit.  Fingers and palm left hand with unchanged numbness/tingling. 1+radials. Up in chair with assist.  Tires easily.

## 2017-02-11 NOTE — Progress Notes (Signed)
PD STARTED

## 2017-02-12 ENCOUNTER — Encounter (INDEPENDENT_AMBULATORY_CARE_PROVIDER_SITE_OTHER): Payer: Medicare HMO | Admitting: Vascular Surgery

## 2017-02-12 LAB — GLUCOSE, CAPILLARY
GLUCOSE-CAPILLARY: 135 mg/dL — AB (ref 65–99)
Glucose-Capillary: 140 mg/dL — ABNORMAL HIGH (ref 65–99)
Glucose-Capillary: 183 mg/dL — ABNORMAL HIGH (ref 65–99)
Glucose-Capillary: 135 mg/dL — ABNORMAL HIGH (ref 65–99)

## 2017-02-12 LAB — CBC
HCT: 23.4 % — ABNORMAL LOW (ref 35.0–47.0)
HEMOGLOBIN: 8.1 g/dL — AB (ref 12.0–16.0)
MCH: 29.7 pg (ref 26.0–34.0)
MCHC: 34.6 g/dL (ref 32.0–36.0)
MCV: 85.8 fL (ref 80.0–100.0)
Platelets: 102 10*3/uL — ABNORMAL LOW (ref 150–440)
RBC: 2.72 MIL/uL — AB (ref 3.80–5.20)
RDW: 17.3 % — ABNORMAL HIGH (ref 11.5–14.5)
WBC: 8.7 10*3/uL (ref 3.6–11.0)

## 2017-02-12 MED ORDER — INSULIN ASPART 100 UNIT/ML ~~LOC~~ SOLN
0.0000 [IU] | Freq: Three times a day (TID) | SUBCUTANEOUS | Status: DC
Start: 2017-02-12 — End: 2017-02-16
  Administered 2017-02-12: 2 [IU] via SUBCUTANEOUS
  Administered 2017-02-12: 1 [IU] via SUBCUTANEOUS
  Administered 2017-02-13: 3 [IU] via SUBCUTANEOUS
  Administered 2017-02-14 – 2017-02-16 (×2): 1 [IU] via SUBCUTANEOUS
  Filled 2017-02-12 (×5): qty 1

## 2017-02-12 NOTE — Consult Note (Signed)
Bethany Lee  CARDIOLOGY CONSULT NOTE  Patient ID: Bethany Lee MRN: [id number] DOB/AGE: [birth year] 66 y.o.  Admit date: 02/10/2017 Referring Physician Dr. Posey Pronto Primary Physician Dr. Ginette Pitman Primary Cardiologist Dr. Saralyn Pilar Reason for Consultation gi bleed/stent  HPI: Pt is a 66 yo female with history of chf, cad and esrd on peritoneal dialysis, hypertension, hyperlipidemia and hypothyroidism who was admitted with recurrent gi bleed. She was admitted 7/9-11 for gi bleed while on plavix which was taken due to pci with des in the distal rca in 2/18. Plavix was stopped. She had recurrent melanotic stools with hgb at 5.8. She received 4 units of prcs with improvement. In her hgb. She denies chest pain.  EKg shows nsr with no ischemia.   Review of Systems  HENT: Negative.   Eyes: Negative.   Respiratory: Negative.   Cardiovascular: Negative.   Gastrointestinal: Positive for blood in stool, diarrhea and melena.  Genitourinary: Negative.   Musculoskeletal: Negative.   Skin: Negative.   Neurological: Positive for weakness.  Endo/Heme/Allergies: Negative.   Psychiatric/Behavioral: Negative.     Past Medical History:  Diagnosis Date  . Anemia   . CHF (congestive heart failure) ([place])   . Chronic kidney disease    peritoneal dialysis  . Complication of anesthesia   . Coronary artery disease   . Diabetes mellitus without complication ([place])   . Dialysis patient [facility])    Peritoneal dialysis patient  . Dyspnea    doe  . Edema   . History of recent blood transfusion 01/2017  . Hypercholesterolemia   . Hypertension   . Hypothyroidism   . Neuropathy   . PONV (postoperative nausea and vomiting)   . Shingles    October-November 2017  . Tremors of nervous system    intermittent when taking gabapentin    History reviewed. No pertinent family history.  Social History   Social History  . Marital status: Married    Spouse name:  N/A  . Number of children: N/A  . Years of education: N/A   Occupational History  . Not on file.   Social History Main Topics  . Smoking status: Former Smoker    Quit date: 10/09/2008  . Smokeless tobacco: Never Used  . Alcohol use No  . Drug use: No  . Sexual activity: Yes   Other Topics Concern  . Not on file   Social History Narrative  . No narrative on file    Past Surgical History:  Procedure Laterality Date  . A/V SHUNT INTERVENTION N/A 01/16/2017   Procedure: A/V Shunt Intervention;  Surgeon: Katha Cabal, MD;  Location: [place] [facility];  Service: Cardiovascular;  Laterality: N/A;  . A/V SHUNTOGRAM Left 01/16/2017   Procedure: A/V Fistulagram;  Surgeon: Katha Cabal, MD;  Location: [place] [facility];  Service: Cardiovascular;  Laterality: Left;  . ABDOMINAL HYSTERECTOMY    . AV FISTULA PLACEMENT Left 11/30/2016   Procedure: ARTERIOVENOUS (AV) FISTULA CREATION ( EXPLORE FOR CREATION BRACHIOCEPHALIC);  Surgeon: Algernon Huxley, MD;  Location: [facility] ORS;  Service: Vascular;  Laterality: Left;  . CAPD INSERTION N/A 11/25/2015   Procedure: LAPAROSCOPIC INSERTION CONTINUOUS AMBULATORY PERITONEAL DIALYSIS  (CAPD) CATHETER;  Surgeon: Algernon Huxley, MD;  Location: [facility] ORS;  Service: Vascular;  Laterality: N/A;  . CAPD INSERTION N/A 01/06/2016   Procedure: LAPAROSCOPIC INSERTION CONTINUOUS AMBULATORY PERITONEAL DIALYSIS  (CAPD) CATHETER REVISION ;  Surgeon: Algernon Huxley, MD;  Location: [facility]  ORS;  Service: Vascular;  Laterality: N/A;  . CAPD INSERTION N/A 10/11/2016   Procedure: LAPAROSCOPIC INSERTION CONTINUOUS AMBULATORY PERITONEAL DIALYSIS  (CAPD) CATHETER ( REVISION );  Surgeon: Algernon Huxley, MD;  Location: [facility] ORS;  Service: Vascular;  Laterality: N/A;  . CARDIAC CATHETERIZATION    . CATARACT EXTRACTION W/PHACO Right 05/25/2016   Procedure: CATARACT EXTRACTION PHACO AND INTRAOCULAR LENS PLACEMENT (IOC);  Surgeon: Eulogio Bear, MD;  Location: [facility] ORS;  Service:  Ophthalmology;  Laterality: Right;  Lot # [id number] H [place]: 00:5.2 AP%: 11.3 CDE: 7.21  . CORONARY ANGIOPLASTY    . CORONARY STENT INTERVENTION N/A 09/07/2016   Procedure: Coronary Stent Intervention;  Surgeon: Isaias Cowman, MD;  Location: [place] [facility];  Service: Cardiovascular;  Laterality: N/A;  . DIALYSIS/PERMA CATHETER INSERTION N/A 09/21/2016   Procedure: Dialysis/Perma Catheter Insertion;  Surgeon: Algernon Huxley, MD;  Location: [place] [facility];  Service: Cardiovascular;  Laterality: N/A;  . DIALYSIS/PERMA CATHETER REMOVAL Right 11/30/2016   Procedure: DIALYSIS/PERMA CATHETER REMOVAL;  Surgeon: Algernon Huxley, MD;  Location: [facility] ORS;  Service: Vascular;  Laterality: Right;  . EYE SURGERY    . LEFT HEART CATH AND CORONARY ANGIOGRAPHY Left 09/07/2016   Procedure: Left Heart Cath and Coronary Angiography;  Surgeon: Isaias Cowman, MD;  Location: [place] [facility];  Service: Cardiovascular;  Laterality: Left;  . LIGATION OF ARTERIOVENOUS  FISTULA Left 02/07/2017   Procedure: LIGATION OF ARTERIOVENOUS  FISTULA ( BANDING BRACHIAL CEPHALIC );  Surgeon: Katha Cabal, MD;  Location: [facility] ORS;  Service: Vascular;  Laterality: Left;  . TUBAL LIGATION    . UPPER EXTREMITY ANGIOGRAPHY Left 01/19/2017   Procedure: Upper Extremity Angiography;  Surgeon: Katha Cabal, MD;  Location: [place] [facility];  Service: Cardiovascular;  Laterality: Left;     Prescriptions Prior to Admission  Medication Sig Dispense Refill Last Dose  . albuterol (PROVENTIL HFA;VENTOLIN HFA) 108 (90 Base) MCG/ACT inhaler Inhale 2 puffs into the lungs every 6 (six) hours as needed for wheezing or shortness of breath.   PRN at PRN  . amLODipine (NORVASC) 5 MG tablet Take 5 mg by mouth daily.   02/10/2017 at Unknown time  . aspirin EC 81 MG tablet Take 1 tablet (81 mg total) by mouth daily. Start taking after NOV 15 (Patient taking differently: Take 81 mg by mouth daily. ) 30 tablet 0 02/10/2017 at Unknown  time  . carvedilol (COREG) 12.5 MG tablet Take 1 tablet (12.5 mg total) by mouth 2 (two) times daily with a meal. (Patient taking differently: Take 25 mg by mouth 2 (two) times daily with a meal. ) 60 tablet 0 02/10/2017 at Unknown time  . clopidogrel (PLAVIX) 75 MG tablet Take 1 tablet (75 mg total) by mouth daily with breakfast. 30 tablet 6 02/10/2017 at Unknown time  . furosemide (LASIX) 80 MG tablet Take 80 mg by mouth 2 (two) times daily.   02/10/2017 at Unknown time  . gentamicin cream (GARAMYCIN) 0.1 % Apply 1 application topically daily. (Patient taking differently: Apply 1 application topically daily. Catheter dressing changes) 15 g 0 prn at prn  . hydrALAZINE (APRESOLINE) 50 MG tablet Take 50 mg by mouth 2 (two) times daily.   02/10/2017 at Unknown time  . Insulin Glargine (TOUJEO SOLOSTAR) 300 UNIT/ML SOPN Inject 20 Units into the skin at bedtime. (Patient taking differently: Inject 24 Units into the skin at bedtime. ) 30 pen 5 02/09/2017 at 2000  . lovastatin (MEVACOR) 40 MG tablet  Take 40 mg by mouth at bedtime.   02/09/2017 at 2000  . metolazone (ZAROXOLYN) 5 MG tablet Take 5 mg by mouth daily.    PRN at PRN  . Multiple Vitamins-Minerals (DIALYVITE 800/ULTRA D) TABS Take 1 tablet by mouth daily.   unknown at unknown  . pantoprazole (PROTONIX) 40 MG tablet Take 1 tablet (40 mg total) by mouth 2 (two) times daily. 60 tablet 0 02/10/2017 at 0800  . Pyridoxine HCl (VITAMIN B6 PO) Take by mouth.   unknown at unknown  . traMADol (ULTRAM) 50 MG tablet Take 1 tablet (50 mg total) by mouth every 8 (eight) hours as needed for moderate pain or severe pain. 40 tablet 0 PRN at PRN  . vitamin B-12 (CYANOCOBALAMIN) 1000 MCG tablet Take 1,000 mcg by mouth daily.   unknown at unknown  . calcium acetate (PHOSLO) 667 MG capsule Take 2,001 mg by mouth 3 (three) times daily with meals.   Not Taking at Unknown time  . insulin aspart (NOVOLOG FLEXPEN) 100 UNIT/ML FlexPen Inject 4-8 Units into the skin 3 (three) times  daily with meals. Sliding scale below 200 take 4 units, 200-250=6 units, >250= 8 units   Not Taking at Unknown time  . levothyroxine (SYNTHROID, LEVOTHROID) 75 MCG tablet Take 75 mcg by mouth daily before breakfast.   Not Taking at Unknown time    Physical Exam: Blood pressure (!) 117/35, pulse 73, temperature 98.7 F (37.1 C), temperature source Oral, resp. rate 18, height 5' (1.524 m), weight 72 kg (158 lb 11.2 oz), SpO2 99 %.   Wt Readings from Last 1 Encounters:  02/12/17 72 kg (158 lb 11.2 oz)     General appearance: alert and cooperative Head: Normocephalic, without obvious abnormality, atraumatic Resp: clear to auscultation bilaterally Cardio: regular rate and rhythm GI: abnormal findings:  mild tenderness in the epigastrium Extremities: extremities normal, atraumatic, no cyanosis or edema Neurologic: Grossly normal  Labs:   Lab Results  Component Value Date   WBC 8.7 02/12/2017   HGB 8.1 (L) 02/12/2017   HCT 23.4 (L) 02/12/2017   MCV 85.8 02/12/2017   PLT 102 (L) 02/12/2017    Recent Labs Lab 02/10/17 1613 02/11/17 0929  NA 135 137  K 5.4* 4.7  CL 96* 101  CO2 24 22  BUN 115* 119*  CREATININE 14.01* 13.50*  CALCIUM 7.3* 7.2*  PROT 5.0*  --   BILITOT 0.7  --   ALKPHOS 32*  --   ALT 11*  --   AST 12*  --   GLUCOSE 185* 121*   Lab Results  Component Value Date   TROPONINI <0.03 01/29/2017      Radiology: Cardiomegaly with improved pulmonary edema EKG: nsr with no ischemia  ASSESSMENT AND PLAN:  66 yo female with history of cad s/p pci of distal rca with a des approximately 6 months ago. Admitted with recurrent gi bleed. Will need to remain off of plavix for now. If ok from gi standpoint, consider low dose asa. Continue with carvedilol . No further cardiac workup indicated.  Signed: Teodoro Spray MD, [facility] 02/12/2017, 3:02 PM

## 2017-02-12 NOTE — Care Management (Signed)
PD patient.  Elvera Bicker dialysis liaison notified.

## 2017-02-12 NOTE — Progress Notes (Signed)
PRE PD ASSESSMENT

## 2017-02-12 NOTE — Progress Notes (Signed)
[place], [place] 02/12/17  Subjective:   Peritoneal dialysis last night. Tolerated treatment well. UF of 407.   Husband at bedside.   Objective:  Vital signs in last 24 hours:  Temp:  [98.2 F (36.8 C)-98.4 F (36.9 C)] 98.2 F (36.8 C) (07/23 0554) Pulse Rate:  [73-82] 77 (07/23 0928) Resp:  [14-18] 18 (07/23 0554) BP: (113-154)/(41-50) 151/44 (07/23 0928) SpO2:  [95 %-100 %] 100 % (07/23 0554) Weight:  [72 kg (158 lb 11.2 oz)] 72 kg (158 lb 11.2 oz) (07/23 0554)  Weight change: 4.853 kg (10 lb 11.2 oz) Filed Weights   02/10/17 2015 02/11/17 0500 02/12/17 0554  Weight: 66.9 kg (147 lb 7.8 oz) 71.6 kg (157 lb 13.6 oz) 72 kg (158 lb 11.2 oz)    Intake/Output:    Intake/Output Summary (Last 24 hours) at 02/12/17 1416 Last data filed at 02/12/17 1300  Gross per 24 hour  Intake              660 ml  Output              376 ml  Net              284 ml     Physical Exam: General: No acute distress, Laying in bed   HEENT Anicteric, moist oral mucous membranes  Neck supple  Pulm/lungs Clear to auscultation, Normal effort   CVS/Heart regular  Abdomen:  Soft, nontender  Extremities: No edema  Neurologic: Alert, oriented, and able to answer questions  Skin: No acute rashes  Access: PD catheter, left upper extremity developing AV fistula       Basic Metabolic Panel:   Recent Labs Lab 02/06/17 1452 02/07/17 1315 02/10/17 1613 02/11/17 0929  NA 140 138 135 137  K 5.3* 5.5* 5.4* 4.7  CL 102  --  96* 101  CO2 25  --  24 22  GLUCOSE 162* 90 185* 121*  BUN 78*  --  115* 119*  CREATININE 14.08*  --  14.01* 13.50*  CALCIUM 6.9*  --  7.3* 7.2*     CBC:  Recent Labs Lab 02/06/17 1452 02/07/17 1315 02/09/17 1302 02/10/17 1613 02/11/17 0014 02/11/17 0929 02/12/17 1210  WBC 7.6  --   --  7.6  --  9.1 8.7  HGB 8.0* 6.8* 5.8* 4.6* 6.6* 10.0* 8.1*  HCT 23.9* 20.0*  --  13.4* 19.1* 28.6* 23.4*  MCV 91.5  --   --  92.8  --  84.6  85.8  PLT 163  --   --  129*  --  110* 102*      Lab Results  Component Value Date   HEPBSAG Negative 01/30/2017   HEPBSAB Reactive 01/30/2017   HEPBIGM Negative 12/19/2015      Microbiology:  Recent Results (from the past 240 hour(s))  Surgical pcr screen     Status: None   Collection Time: 02/06/17  2:52 PM  Result Value Ref Range Status   MRSA, PCR NEGATIVE NEGATIVE Final   Staphylococcus aureus NEGATIVE NEGATIVE Final    Comment:        The Xpert SA Assay (FDA approved for NASAL specimens in patients over 86 years of age), is one component of a comprehensive surveillance program.  Test performance has been validated by [facility] for patients greater than or equal to 52 year old. It is not intended to diagnose infection nor to guide or monitor treatment.   MRSA PCR Screening  Status: None   Collection Time: 02/10/17  8:20 PM  Result Value Ref Range Status   MRSA by PCR NEGATIVE NEGATIVE Final    Comment:        The GeneXpert MRSA Assay (FDA approved for NASAL specimens only), is one component of a comprehensive MRSA colonization surveillance program. It is not intended to diagnose MRSA infection nor to guide or monitor treatment for MRSA infections.     Coagulation Studies:  Recent Labs  02/10/17 1613  LABPROT 13.9  INR 1.07    Urinalysis: No results for input(s): COLORURINE, LABSPEC, PHURINE, GLUCOSEU, HGBUR, BILIRUBINUR, KETONESUR, PROTEINUR, UROBILINOGEN, NITRITE, LEUKOCYTESUR in the last 72 hours.  Invalid input(s): APPERANCEUR    Imaging: Dg Chest Port 1 View  Result Date: 02/11/2017 CLINICAL DATA:  Cough for 6 months, history CHF, coronary artery disease post stenting, diabetes mellitus, hypertension, former smoker EXAM: PORTABLE CHEST 1 VIEW COMPARISON:  Portable exam 1206 hours compared 03/23/2017 FINDINGS: Enlargement of cardiac silhouette. Atherosclerotic calcification aorta with stent at a proximal great vessel new since  previous study. Mediastinal contours and pulmonary vascularity normal. Improved pulmonary edema since previous exam. Minimal peribronchial thickening. No definite infiltrate, pleural effusion or pneumothorax. Bones demineralized. IMPRESSION: Enlargement of cardiac silhouette with improved pulmonary edema since prior study. No acute abnormalities. Aortic Atherosclerosis (ICD10-I70.0). Electronically Signed   By: Lavonia Dana M.D.   On: 02/11/2017 12:33     Medications:       Assessment/ Plan:  66 y.o. [place] female with end-stage renal disease on peritoneal dialysis, congestive heart failure, coronary disease, diabetes, hypertension, hyperlipidemia, hypothyroidism.  Dialysis: [address]: [facility] CCPD 5 x 2.25Litres ; tidal 85%  1.  ESRD on PD. inadequate dialysis as outpatient. Tolerated treatment last night.  - Increase fill volumes to 2.19mL.  - Continue 1.5% dextrose solution.  2. Anemia of CKD and blood loss: status post 4 units of PRBC transfusion.  - Appreciate GI input - holding clopidogrel. Monitor platelet count.   3. Secondary hyperparathyroidism with hypocalcemia. Phosphorus at goal - calcium acetate  4. Hypertension. Continue current doses of amlodipine, carvedilol, and hydralazine.    LOS: 2 Lavonia Dana 7/23/20182:16 PM  [address] [place], [place]

## 2017-02-12 NOTE — Progress Notes (Signed)
[facility] [place] NAME: Bethany Lee    MR#:  [id number]  DATE OF BIRTH:  [birth year]  SUBJECTIVE:   A bit frustrated since procedures will not happen till thurs or Friday. Black colored diarrheal stools REVIEW OF SYSTEMS:   Review of Systems  Constitutional: Negative for chills, fever and weight loss.  HENT: Negative for ear discharge, ear pain and nosebleeds.   Eyes: Negative for blurred vision, pain and discharge.  Respiratory: Negative for sputum production, shortness of breath, wheezing and stridor.   Cardiovascular: Negative for chest pain, palpitations, orthopnea and PND.  Gastrointestinal: Positive for diarrhea and melena. Negative for abdominal pain, nausea and vomiting.  Genitourinary: Negative for frequency and urgency.  Musculoskeletal: Negative for back pain and joint pain.  Neurological: Positive for weakness. Negative for sensory change, speech change and focal weakness.  Psychiatric/Behavioral: Negative for depression and hallucinations. The patient is not nervous/anxious.    Tolerating Diet:clears Tolerating PT: pending  DRUG ALLERGIES:   Allergies  Allergen Reactions  . Penicillins     Childhood loss of consciousness  Has patient had a PCN reaction causing immediate rash, facial/tongue/throat swelling, SOB or lightheadedness with hypotension: Yes Has patient had a PCN reaction causing severe rash involving mucus membranes or skin necrosis: No Has patient had a PCN reaction that required hospitalization Unknown Has patient had a PCN reaction occurring within the last 10 years: Unknown If all of the above answers are "NO", then may proceed with Cephalosporin use.   . Valacyclovir Nausea Only    VITALS:  Blood pressure (!) 117/35, pulse 73, temperature 98.7 F (37.1 C), temperature source Oral, resp. rate 18, height 5' (1.524 m), weight 72 kg (158 lb 11.2 oz), SpO2 99 %.  PHYSICAL EXAMINATION:   Physical  Exam  GENERAL:  66 y.o.-year-old patient lying in the bed with no acute distress. Pallor+ EYES: Pupils equal, round, reactive to light and accommodation. No scleral icterus. Extraocular muscles intact.  HEENT: Head atraumatic, normocephalic. Oropharynx and nasopharynx clear.  NECK:  Supple, no jugular venous distention. No thyroid enlargement, no tenderness.  LUNGS: Normal breath sounds bilaterally, no wheezing, rales, rhonchi. No use of accessory muscles of respiration.  CARDIOVASCULAR: S1, S2 normal. No murmurs, rubs, or gallops.  ABDOMEN: Soft, nontender, nondistended. Bowel sounds present. No organomegaly or mass. PD cath+ EXTREMITIES: No cyanosis, clubbing or edema b/l.    NEUROLOGIC: Cranial nerves II through XII are intact. No focal Motor or sensory deficits b/l.   PSYCHIATRIC:  patient is alert and oriented x 3.  SKIN: No obvious rash, lesion, or ulcer.   LABORATORY PANEL:  CBC  Recent Labs Lab 02/12/17 1210  WBC 8.7  HGB 8.1*  HCT 23.4*  PLT 102*    Chemistries   Recent Labs Lab 02/10/17 1613 02/11/17 0929  NA 135 137  K 5.4* 4.7  CL 96* 101  CO2 24 22  GLUCOSE 185* 121*  BUN 115* 119*  CREATININE 14.01* 13.50*  CALCIUM 7.3* 7.2*  AST 12*  --   ALT 11*  --   ALKPHOS 32*  --   BILITOT 0.7  --    Cardiac Enzymes No results for input(s): TROPONINI in the last 168 hours. RADIOLOGY:  Dg Chest Port 1 View  Result Date: 02/11/2017 CLINICAL DATA:  Cough for 6 months, history CHF, coronary artery disease post stenting, diabetes mellitus, hypertension, former smoker EXAM: PORTABLE CHEST 1 VIEW COMPARISON:  Portable exam 1206 hours compared 03/23/2017 FINDINGS:  Enlargement of cardiac silhouette. Atherosclerotic calcification aorta with stent at a proximal great vessel new since previous study. Mediastinal contours and pulmonary vascularity normal. Improved pulmonary edema since previous exam. Minimal peribronchial thickening. No definite infiltrate, pleural effusion or  pneumothorax. Bones demineralized. IMPRESSION: Enlargement of cardiac silhouette with improved pulmonary edema since prior study. No acute abnormalities. Aortic Atherosclerosis (ICD10-I70.0). Electronically Signed   By: Lavonia Dana M.D.   On: 02/11/2017 12:33   ASSESSMENT AND PLAN:    66 year old female with history of end-stage renal disease on peritoneal dialysis who was recently admitted to the hospital at the beginning of July for GI bleed and did not have endoscopy due to being on Plavix who presents with black dark-colored stools.  1. Upper GI bleed: - Stop aspirin and Plavix. -Continue Protonix 40 IV twice a day -GI consult requested. She will need EGD and colonoscopy during this hospital stay.since plavix was stopped 02/10/17---procedure will be scheduled 5 days after that  2. Acute on chronic anemia/acute blood loss anemia Patient came in with hemoglobin of 5.8--- 4.6----4 unit blood transfusion----10---8.1 Continue PPI  3. ESRD on peritoneal dialysis: Nephrology consult requested and discussed with Dr. Holley Raring - Patient's peritoneal dialysis has been resumed  4. Diabetes: Patient is nothing by mouth and therefore will hold outpatient medications Start sliding scale  5. Essential hypertension: Continue Norvasc and Coreg and hydralazine  6. Hypothyroid: Continue Synthroid  7. CAD with recent stent in Feb 2018 in distal RCA -Spoke with Dr Fath----OK to stop plavix given significant GI bleed from now onwards   Discussed with husband at bedside Case discussed with Care Management/Social Worker. Management plans discussed with the patient, family and they are in agreement.  CODE STATUS: Full DVT Prophylaxis: SCD TOTAL TIME TAKING CARE OF THIS PATIENT: 25 minutes.  >50% time spent on counselling and coordination of care  POSSIBLE D/C IN 2-3 DAYS, DEPENDING ON CLINICAL CONDITION.  Note: This dictation was prepared with Dragon dictation along with smaller phrase  technology. Any transcriptional errors that result from this process are unintentional.  Ermal Haberer M.D on 02/12/2017 at 5:18 PM  Between 7am to 6pm - Pager - [phone]  After 6pm go to [email or web address] - password [email or web address]  Office  [phone]  CC: Primary care physician; Tracie Harrier, MD

## 2017-02-12 NOTE — Progress Notes (Signed)
PD COMPLETE

## 2017-02-13 LAB — GLUCOSE, CAPILLARY
GLUCOSE-CAPILLARY: 90 mg/dL (ref 65–99)
GLUCOSE-CAPILLARY: 169 mg/dL — AB (ref 65–99)
Glucose-Capillary: 117 mg/dL — ABNORMAL HIGH (ref 65–99)
Glucose-Capillary: 214 mg/dL — ABNORMAL HIGH (ref 65–99)

## 2017-02-13 LAB — CBC
HCT: 21.5 % — ABNORMAL LOW (ref 35.0–47.0)
Hemoglobin: 7.5 g/dL — ABNORMAL LOW (ref 12.0–16.0)
MCH: 30.2 pg (ref 26.0–34.0)
MCHC: 34.9 g/dL (ref 32.0–36.0)
MCV: 86.5 fL (ref 80.0–100.0)
PLATELETS: 118 10*3/uL — AB (ref 150–440)
RBC: 2.48 MIL/uL — AB (ref 3.80–5.20)
RDW: 16.9 % — ABNORMAL HIGH (ref 11.5–14.5)
WBC: 7.2 10*3/uL (ref 3.6–11.0)

## 2017-02-13 LAB — HEMOGLOBIN A1C: Mean Plasma Glucose: <74 mg/dL

## 2017-02-13 LAB — HEMOGLOBIN AND HEMATOCRIT, BLOOD
HCT: 25.5 % — ABNORMAL LOW (ref 35.0–47.0)
HEMOGLOBIN: 8.7 g/dL — AB (ref 12.0–16.0)

## 2017-02-13 LAB — PREPARE RBC (CROSSMATCH)

## 2017-02-13 LAB — TROPONIN I: Troponin I: <0.03 ng/mL (ref <0.03)

## 2017-02-13 MED ORDER — ONDANSETRON HCL 4 MG/2ML IJ SOLN: 4.0000 mg | Freq: Four times a day (QID) | INTRAMUSCULAR | Status: DC | PRN

## 2017-02-13 MED ORDER — ONDANSETRON HCL 4 MG/2ML IJ SOLN
INTRAMUSCULAR | Status: AC
  Administered 2017-02-13: 13:00:00
  Filled 2017-02-13: qty 2

## 2017-02-13 MED ORDER — SODIUM CHLORIDE 0.9 % IV SOLN
Freq: Once | INTRAVENOUS | Status: AC
  Administered 2017-02-13: 12:00:00 via INTRAVENOUS

## 2017-02-13 NOTE — Progress Notes (Signed)
POST PD ASSESSMENT

## 2017-02-13 NOTE — Progress Notes (Signed)
Bethany Lee NAME: Bethany Lee    MR#:  [id number]  DATE OF BIRTH:  October 31, 1950  SUBJECTIVE:   A bit frustrated since procedures will not happen till thurs  Black colored diarrheal stools REVIEW OF SYSTEMS:   Review of Systems  Constitutional: Negative for chills, fever and weight loss.  HENT: Negative for ear discharge, ear pain and nosebleeds.   Eyes: Negative for blurred vision, pain and discharge.  Respiratory: Negative for sputum production, shortness of breath, wheezing and stridor.   Cardiovascular: Negative for chest pain, palpitations, orthopnea and PND.  Gastrointestinal: Positive for diarrhea and melena. Negative for abdominal pain, nausea and vomiting.  Genitourinary: Negative for frequency and urgency.  Musculoskeletal: Negative for back pain and joint pain.  Neurological: Positive for weakness. Negative for sensory change, speech change and focal weakness.  Psychiatric/Behavioral: Negative for depression and hallucinations. The patient is not nervous/anxious.    Tolerating Diet:clears Tolerating PT: pending  DRUG ALLERGIES:   Allergies  Allergen Reactions  . Penicillins     Childhood loss of consciousness  Has patient had a PCN reaction causing immediate rash, facial/tongue/throat swelling, SOB or lightheadedness with hypotension: Yes Has patient had a PCN reaction causing severe rash involving mucus membranes or skin necrosis: No Has patient had a PCN reaction that required hospitalization Unknown Has patient had a PCN reaction occurring within the last 10 years: Unknown If all of the above answers are "NO", then may proceed with Cephalosporin use.   . Valacyclovir Nausea Only    VITALS:  Blood pressure (!) 108/56, pulse 74, temperature 98 F (36.7 C), temperature source Oral, resp. rate 16, height 5' (1.524 m), weight 68.2 kg (150 lb 5.7 oz), SpO2 100 %.  PHYSICAL EXAMINATION:   Physical  Exam  GENERAL:  66 y.o.-year-old patient lying in the bed with no acute distress. Pallor+ EYES: Pupils equal, round, reactive to light and accommodation. No scleral icterus. Extraocular muscles intact.  HEENT: Head atraumatic, normocephalic. Oropharynx and nasopharynx clear.  NECK:  Supple, no jugular venous distention. No thyroid enlargement, no tenderness.  LUNGS: Normal breath sounds bilaterally, no wheezing, rales, rhonchi. No use of accessory muscles of respiration.  CARDIOVASCULAR: S1, S2 normal. No murmurs, rubs, or gallops.  ABDOMEN: Soft, nontender, nondistended. Bowel sounds present. No organomegaly or mass. PD cath+ EXTREMITIES: No cyanosis, clubbing or edema b/l.    NEUROLOGIC: Cranial nerves II through XII are intact. No focal Motor or sensory deficits b/l.   PSYCHIATRIC:  patient is alert and oriented x 3.  SKIN: No obvious rash, lesion, or ulcer.   LABORATORY PANEL:  CBC  Recent Labs Lab 02/13/17 0446  WBC 7.2  HGB 7.5*  HCT 21.5*  PLT 118*    Chemistries   Recent Labs Lab 02/10/17 1613 02/11/17 0929  NA 135 137  K 5.4* 4.7  CL 96* 101  CO2 24 22  GLUCOSE 185* 121*  BUN 115* 119*  CREATININE 14.01* 13.50*  CALCIUM 7.3* 7.2*  AST 12*  --   ALT 11*  --   ALKPHOS 32*  --   BILITOT 0.7  --    Cardiac Enzymes No results for input(s): TROPONINI in the last 168 hours. RADIOLOGY:  No results found. ASSESSMENT AND PLAN:    66 year old female with history of end-stage renal disease on peritoneal dialysis who was recently admitted to the hospital at the beginning of July for GI bleed and did not have endoscopy due to being  on Plavix who presents with black dark-colored stools.  1. Upper GI bleed: - Stop aspirin and Plavix. -Continue Protonix 40 IV twice a day -GI consult requested. She will need EGD and colonoscopy during this hospital stay.since plavix was stopped 02/10/17---procedure will be scheduled 5 days after that---on thursday  2. Acute on  chronic anemia/acute blood loss anemia Patient came in with hemoglobin of 5.8--- 4.6---- 4 unit blood transfusion----10---8.1--7.5---1 unit BT Continue PPI  3. ESRD on peritoneal dialysis: Nephrology consult requested and discussed with Dr. Holley Raring - Patient's peritoneal dialysis has been resumed  4. Diabetes: Start sliding scale  5. Essential hypertension: Continue Norvasc and Coreg and hydralazine  6. Hypothyroid: Continue Synthroid  7. CAD with recent stent in Feb 2018 in distal RCA -Spoke with Dr Fath----OK to stop plavix given significant GI bleed from now onwards   Discussed with husband at bedside Case discussed with Care Management/Social Worker. Management plans discussed with the patient, family and they are in agreement.  CODE STATUS: Full DVT Prophylaxis: SCD TOTAL TIME TAKING CARE OF THIS PATIENT: 25 minutes.  >50% time spent on counselling and coordination of care  POSSIBLE D/C IN 2-3 DAYS, DEPENDING ON CLINICAL CONDITION.  Note: This dictation was prepared with Dragon dictation along with smaller phrase technology. Any transcriptional errors that result from this process are unintentional.  Caston Coopersmith M.D on 02/13/2017 at 3:19 PM  Between 7am to 6pm - Pager - [phone]  After 6pm go to [email or web address] - password [email or web address]  Office  [phone]  CC: Primary care physician; Bethany Harrier, MD

## 2017-02-13 NOTE — Progress Notes (Signed)
Chaplain rounding unit visited with pt. Pt's husband at bedside. Husband appeared concerned about pt's health and talked about pt's health struggles with [facility]; and also mentioned the proposed treatment plan for pt. CH encourage pt and her husband and offered prayer and spiritual support for pt and husband. [place] to follow up with pt as needed.   02/13/17 1400  Clinical Encounter Type  Visited With Patient and family together  Visit Type Initial;Spiritual support;Other (Comment)  Referral From [facility];Other (Comment)

## 2017-02-13 NOTE — Progress Notes (Addendum)
Pt with c/o pain in left arm and chest pressure. MD at bedside. Pt on telemetry with no changes noted, pt in NSR. Will obtain cardiac markers with post transfusion H&H draw as pt is difficult stick. Pain in arm seems to be r/t the dialysis access site, will medicate for pain and continue to assess.

## 2017-02-13 NOTE — Progress Notes (Signed)
[place], [place] 02/13/17  Subjective:   Peritoneal dialysis last night. Tolerated treatment well. UF of 103  PRBC 1 unit ordered.   Objective:  Vital signs in last 24 hours:  Temp:  [97.8 F (36.6 C)-98.7 F (37.1 C)] 97.9 F (36.6 C) (07/24 0412) Pulse Rate:  [68-74] 74 (07/24 0412) Resp:  [16-18] 18 (07/24 0412) BP: (108-134)/(35-68) 134/68 (07/24 0745) SpO2:  [99 %-100 %] 100 % (07/24 0412) Weight:  [68.2 kg (150 lb 5.7 oz)-72 kg (158 lb 11.7 oz)] 68.2 kg (150 lb 5.7 oz) (07/24 0915)  Weight change: 0.014 kg (0.5 oz) Filed Weights   02/12/17 0554 02/13/17 0505 02/13/17 0915  Weight: 72 kg (158 lb 11.2 oz) 72 kg (158 lb 11.7 oz) 68.2 kg (150 lb 5.7 oz)    Intake/Output:    Intake/Output Summary (Last 24 hours) at 02/13/17 1057 Last data filed at 02/13/17 1041  Gross per 24 hour  Intake              660 ml  Output              103 ml  Net              557 ml     Physical Exam: General: No acute distress, Laying in bed   HEENT Anicteric, moist oral mucous membranes  Neck supple  Pulm/lungs Clear to auscultation, Normal effort   CVS/Heart regular  Abdomen:  Soft, nontender  Extremities: No edema  Neurologic: Alert, oriented, and able to answer questions  Skin: No acute rashes  Access: PD catheter, left upper extremity developing AV fistula       Basic Metabolic Panel:   Recent Labs Lab 02/06/17 1452 02/07/17 1315 02/10/17 1613 02/11/17 0929  NA 140 138 135 137  K 5.3* 5.5* 5.4* 4.7  CL 102  --  96* 101  CO2 25  --  24 22  GLUCOSE 162* 90 185* 121*  BUN 78*  --  115* 119*  CREATININE 14.08*  --  14.01* 13.50*  CALCIUM 6.9*  --  7.3* 7.2*     CBC:  Recent Labs Lab 02/06/17 1452  02/10/17 1613 02/11/17 0014 02/11/17 0929 02/12/17 1210 02/13/17 0446  WBC 7.6  --  7.6  --  9.1 8.7 7.2  HGB 8.0*  < > 4.6* 6.6* 10.0* 8.1* 7.5*  HCT 23.9*  < > 13.4* 19.1* 28.6* 23.4* 21.5*  MCV 91.5  --  92.8  --  84.6 85.8  86.5  PLT 163  --  129*  --  110* 102* 118*  < > = values in this interval not displayed.    Lab Results  Component Value Date   HEPBSAG Negative 01/30/2017   HEPBSAB Reactive 01/30/2017   HEPBIGM Negative 12/19/2015      Microbiology:  Recent Results (from the past 240 hour(s))  Surgical pcr screen     Status: None   Collection Time: 02/06/17  2:52 PM  Result Value Ref Range Status   MRSA, PCR NEGATIVE NEGATIVE Final   Staphylococcus aureus NEGATIVE NEGATIVE Final    Comment:        The Xpert SA Assay (FDA approved for NASAL specimens in patients over 54 years of age), is one component of a comprehensive surveillance program.  Test performance has been validated by [facility] for patients greater than or equal to 35 year old. It is not intended to diagnose infection nor to guide or monitor treatment.  MRSA PCR Screening     Status: None   Collection Time: 02/10/17  8:20 PM  Result Value Ref Range Status   MRSA by PCR NEGATIVE NEGATIVE Final    Comment:        The GeneXpert MRSA Assay (FDA approved for NASAL specimens only), is one component of a comprehensive MRSA colonization surveillance program. It is not intended to diagnose MRSA infection nor to guide or monitor treatment for MRSA infections.     Coagulation Studies:  Recent Labs  02/10/17 1613  LABPROT 13.9  INR 1.07    Urinalysis: No results for input(s): COLORURINE, LABSPEC, PHURINE, GLUCOSEU, HGBUR, BILIRUBINUR, KETONESUR, PROTEINUR, UROBILINOGEN, NITRITE, LEUKOCYTESUR in the last 72 hours.  Invalid input(s): APPERANCEUR    Imaging: Dg Chest Port 1 View  Result Date: 02/11/2017 CLINICAL DATA:  Cough for 6 months, history CHF, coronary artery disease post stenting, diabetes mellitus, hypertension, former smoker EXAM: PORTABLE CHEST 1 VIEW COMPARISON:  Portable exam 1206 hours compared 03/23/2017 FINDINGS: Enlargement of cardiac silhouette. Atherosclerotic calcification aorta with stent  at a proximal great vessel new since previous study. Mediastinal contours and pulmonary vascularity normal. Improved pulmonary edema since previous exam. Minimal peribronchial thickening. No definite infiltrate, pleural effusion or pneumothorax. Bones demineralized. IMPRESSION: Enlargement of cardiac silhouette with improved pulmonary edema since prior study. No acute abnormalities. Aortic Atherosclerosis (ICD10-I70.0). Electronically Signed   By: Lavonia Dana M.D.   On: 02/11/2017 12:33     Medications:       Assessment/ Plan:  66 y.o. [place] female with end-stage renal disease on peritoneal dialysis, congestive heart failure, coronary disease, diabetes, hypertension, hyperlipidemia, hypothyroidism.  Dialysis: [facility] [address]: [facility] CCPD 5 x 2.25Litres ; tidal 85%  1.  ESRD on PD. inadequate dialysis as outpatient. Tolerated treatment last night.  - Increased fill volumes to 2.47mL.  - Continue 1.5% dextrose solution.  2. Anemia of CKD and blood loss: status post 4 units of PRBC transfusion. PRBC transfusion ordered for today - Appreciate GI input: endoscopy for Thursday  - holding clopidogrel. Monitor platelet count.   3. Secondary hyperparathyroidism with hypocalcemia. Phosphorus at goal - calcium acetate  4. Hypertension.  - Continue amlodipine, carvedilol, and hydralazine.    LOS: 3 Lavonia Dana 7/24/201810:57 AM  [address] [place], [place]

## 2017-02-13 NOTE — Progress Notes (Signed)
Informed On call MD, Pyredy about decrease in lab values, Hmb 7.5.

## 2017-02-13 NOTE — Progress Notes (Signed)
Bethany Bellows MD, MRCP(U.K) [address]  [place]  [place], [place] [address]  Main: [phone]    Bethany Lee is being followed for GI bleed  Subjective: Still having black stool but decreasing in qty , no abdominal pain    Objective: Vital signs in last 24 hours: Vitals:   02/12/17 1449 02/12/17 2009 02/13/17 0412 02/13/17 0505  BP: (!) 117/35 (!) 108/37 (!) 133/39   Pulse: 73 68 74   Resp:  16 18   Temp: 98.7 F (37.1 C) 97.8 F (36.6 C) 97.9 F (36.6 C)   TempSrc: Oral Oral Oral   SpO2: 99% 100% 100%   Weight:    158 lb 11.7 oz (72 kg)  Height:       Weight change: 0.5 oz (0.014 kg)  Intake/Output Summary (Last 24 hours) at 02/13/17 0741 Last data filed at 02/13/17 0411  Gross per 24 hour  Intake              300 ml  Output              407 ml  Net             -107 ml     Exam: Heart:: Regular rate and rhythm, S1S2 present or without murmur or extra heart sounds Lungs: normal, clear to auscultation and clear to auscultation and percussion Abdomen: soft, nontender, normal bowel sounds   Lab Results: @LABTEST2 @ Micro Results: Recent Results (from the past 240 hour(s))  Surgical pcr screen     Status: None   Collection Time: 02/06/17  2:52 PM  Result Value Ref Range Status   MRSA, PCR NEGATIVE NEGATIVE Final   Staphylococcus aureus NEGATIVE NEGATIVE Final    Comment:        The Xpert SA Assay (FDA approved for NASAL specimens in patients over 7 years of age), is one component of a comprehensive surveillance program.  Test performance has been validated by [facility] for patients greater than or equal to 41 year old. It is not intended to diagnose infection nor to guide or monitor treatment.   MRSA PCR Screening     Status: None   Collection Time: 02/10/17  8:20 PM  Result Value Ref Range Status   MRSA by PCR NEGATIVE NEGATIVE Final    Comment:        The GeneXpert MRSA Assay (FDA approved for NASAL specimens only), is one  component of a comprehensive MRSA colonization surveillance program. It is not intended to diagnose MRSA infection nor to guide or monitor treatment for MRSA infections.    Studies/Results: Dg Chest Port 1 View  Result Date: 02/11/2017 CLINICAL DATA:  Cough for 6 months, history CHF, coronary artery disease post stenting, diabetes mellitus, hypertension, former smoker EXAM: PORTABLE CHEST 1 VIEW COMPARISON:  Portable exam 1206 hours compared 03/23/2017 FINDINGS: Enlargement of cardiac silhouette. Atherosclerotic calcification aorta with stent at a proximal great vessel new since previous study. Mediastinal contours and pulmonary vascularity normal. Improved pulmonary edema since previous exam. Minimal peribronchial thickening. No definite infiltrate, pleural effusion or pneumothorax. Bones demineralized. IMPRESSION: Enlargement of cardiac silhouette with improved pulmonary edema since prior study. No acute abnormalities. Aortic Atherosclerosis (ICD10-I70.0). Electronically Signed   By: Lavonia Dana M.D.   On: 02/11/2017 12:33   Medications: I have reviewed the patient's current medications. Scheduled Meds: . amLODipine  5 mg Oral Daily  . calcium acetate  2,001 mg Oral TID WC  . carvedilol  25 mg Oral BID WC  . furosemide  80 mg Oral BID  . gentamicin cream  1 application Topical Daily  . hydrALAZINE  50 mg Oral BID  . insulin aspart  0-9 Units Subcutaneous TID WC  . levothyroxine  75 mcg Oral QAC breakfast  . mouth rinse  15 mL Mouth Rinse BID  . multivitamin with minerals  1 tablet Oral Daily  . pantoprazole (PROTONIX) IV  40 mg Intravenous Q12H  . vitamin B-12  1,000 mcg Oral Daily   Continuous Infusions: . dialysis solution 1.5% low-MG/low-CA     PRN Meds:.acetaminophen **OR** acetaminophen, heparin, HYDROcodone-acetaminophen, senna-docusate   Assessment: Active Problems:   GIB (gastrointestinal bleeding)   Acute blood loss anemia   Bethany Lee 66 y.o. female who  was asprin and plavix readmitted with black colored stools.   Plan: 1. EGD+colonoscopy on Thursday off plavix  2. Monitor CBC and transfuse as needed    LOS: 3 days   Bethany Lee 02/13/2017, 7:41 AM

## 2017-02-13 NOTE — Progress Notes (Signed)
Pt alert, no c/o, PD start with no complications.

## 2017-02-13 NOTE — Progress Notes (Signed)
Pt PD complete. No c/o, alert. 130 ml removed. Post PD weight 68.2 kg

## 2017-02-13 NOTE — Care Management Important Message (Signed)
Important Message  Patient Details  Name: Bethany Lee MRN: [id number] Date of Birth: 11-20-1950   Medicare Important Message Given:  Yes    Carles Collet, RN 02/13/2017, 3:06 PM

## 2017-02-13 NOTE — Progress Notes (Signed)
PD COMPLETE

## 2017-02-13 NOTE — Progress Notes (Signed)
PRE PD INFO

## 2017-02-14 LAB — TYPE AND SCREEN
ABO/RH(D): A POS
ANTIBODY SCREEN: NEGATIVE
UNIT DIVISION: 0
UNIT DIVISION: 0
UNIT DIVISION: 0
Unit division: 0
Unit division: 0
Unit division: 0
Unit division: 0
Unit division: 0

## 2017-02-14 LAB — CBC
HCT: 23.3 % — ABNORMAL LOW (ref 35.0–47.0)
Hemoglobin: 7.9 g/dL — ABNORMAL LOW (ref 12.0–16.0)
MCH: 28.6 pg (ref 26.0–34.0)
MCHC: 33.7 g/dL (ref 32.0–36.0)
MCV: 84.7 fL (ref 80.0–100.0)
PLATELETS: 111 10*3/uL — AB (ref 150–440)
RBC: 2.75 MIL/uL — AB (ref 3.80–5.20)
RDW: 17.7 % — AB (ref 11.5–14.5)
WBC: 6.1 10*3/uL (ref 3.6–11.0)

## 2017-02-14 LAB — BPAM RBC
BLOOD PRODUCT EXPIRATION DATE: 201807252359
BLOOD PRODUCT EXPIRATION DATE: 201807292359
BLOOD PRODUCT EXPIRATION DATE: 201807312359
BLOOD PRODUCT EXPIRATION DATE: 201808042359
BLOOD PRODUCT EXPIRATION DATE: 201808072359
BLOOD PRODUCT EXPIRATION DATE: 201808072359
Blood Product Expiration Date: 201808022359
Blood Product Expiration Date: 201808072359
ISSUE DATE / TIME: 201807212033
ISSUE DATE / TIME: 201807220546
ISSUE DATE / TIME: 201807232251
ISSUE DATE / TIME: 201807241239
ISSUE DATE / TIME: 201807211733
ISSUE DATE / TIME: 201807220302
ISSUE DATE / TIME: 201807221402
UNIT TYPE AND RH: 600
UNIT TYPE AND RH: 6200
UNIT TYPE AND RH: 9500
Unit Type and Rh: 600
Unit Type and Rh: 6200
Unit Type and Rh: 6200
Unit Type and Rh: 6200
Unit Type and Rh: 9500

## 2017-02-14 LAB — GLUCOSE, CAPILLARY
GLUCOSE-CAPILLARY: 111 mg/dL — AB (ref 65–99)
GLUCOSE-CAPILLARY: 122 mg/dL — AB (ref 65–99)
GLUCOSE-CAPILLARY: 127 mg/dL — AB (ref 65–99)
Glucose-Capillary: 118 mg/dL — ABNORMAL HIGH (ref 65–99)

## 2017-02-14 LAB — TROPONIN I

## 2017-02-14 LAB — PREPARE RBC (CROSSMATCH)

## 2017-02-14 MED ORDER — SODIUM CHLORIDE 0.9 % IV SOLN
INTRAVENOUS | Status: DC
  Administered 2017-02-15: 10 mL via INTRAVENOUS

## 2017-02-14 MED ORDER — SODIUM CHLORIDE 0.9 % IV SOLN
Freq: Once | INTRAVENOUS | Status: AC
  Administered 2017-02-14: 17:00:00 via INTRAVENOUS

## 2017-02-14 MED ORDER — EPOETIN ALFA 10000 UNIT/ML IJ SOLN
20000.0000 [IU] | Freq: Once | INTRAMUSCULAR | Status: AC
  Administered 2017-02-14: 20000 [IU] via SUBCUTANEOUS

## 2017-02-14 MED ORDER — PEG 3350-KCL-NA BICARB-NACL 420 G PO SOLR
4000.0000 mL | Freq: Once | ORAL | Status: AC
  Administered 2017-02-14: 4000 mL via ORAL
  Filled 2017-02-14: qty 4000

## 2017-02-14 MED ORDER — EPOETIN ALFA 20000 UNIT/ML IJ SOLN
20000.0000 [IU] | Freq: Once | INTRAMUSCULAR | Status: DC
  Filled 2017-02-14: qty 1

## 2017-02-14 NOTE — Progress Notes (Signed)
PD START w/ no complications. Pt a&ox3, stable.

## 2017-02-14 NOTE — Progress Notes (Signed)
[place], [place] 02/14/17  Subjective:   Peritoneal dialysis last night. Tolerated treatment well. UF of 646  Objective:  Vital signs in last 24 hours:  Temp:  [98 F (36.7 C)-98.4 F (36.9 C)] 98.2 F (36.8 C) (07/25 1212) Pulse Rate:  [68-74] 68 (07/25 1212) Resp:  [16-18] 18 (07/25 0453) BP: (108-126)/(41-68) 121/42 (07/25 1212) SpO2:  [97 %-100 %] 97 % (07/25 1212) Weight:  [70.8 kg (156 lb 1.4 oz)] 70.8 kg (156 lb 1.4 oz) (07/25 0500)  Weight change: -3.8 kg (-8 lb 6 oz) Filed Weights   02/13/17 0505 02/13/17 0915 02/14/17 0500  Weight: 72 kg (158 lb 11.7 oz) 68.2 kg (150 lb 5.7 oz) 70.8 kg (156 lb 1.4 oz)    Intake/Output:    Intake/Output Summary (Last 24 hours) at 02/14/17 1257 Last data filed at 02/14/17 0900  Gross per 24 hour  Intake           1197.7 ml  Output              646 ml  Net            551.7 ml     Physical Exam: General: No acute distress, Laying in bed   HEENT Anicteric, moist oral mucous membranes  Neck supple  Pulm/lungs Clear to auscultation, Normal effort   CVS/Heart regular  Abdomen:  Soft, nontender  Extremities: No edema  Neurologic: Alert, oriented, and able to answer questions  Skin: No acute rashes  Access: PD catheter, left upper extremity developing AV fistula       Basic Metabolic Panel:   Recent Labs Lab 02/07/17 1315 02/10/17 1613 02/11/17 0929  NA 138 135 137  K 5.5* 5.4* 4.7  CL  --  96* 101  CO2  --  24 22  GLUCOSE 90 185* 121*  BUN  --  115* 119*  CREATININE  --  14.01* 13.50*  CALCIUM  --  7.3* 7.2*     CBC:  Recent Labs Lab 02/10/17 1613  02/11/17 0929 02/12/17 1210 02/13/17 0446 02/13/17 1657 02/14/17 1018  WBC 7.6  --  9.1 8.7 7.2  --  6.1  HGB 4.6*  < > 10.0* 8.1* 7.5* 8.7* 7.9*  HCT 13.4*  < > 28.6* 23.4* 21.5* 25.5* 23.3*  MCV 92.8  --  84.6 85.8 86.5  --  84.7  PLT 129*  --  110* 102* 118*  --  111*  < > = values in this interval not displayed.     Lab Results  Component Value Date   HEPBSAG Negative 01/30/2017   HEPBSAB Reactive 01/30/2017   HEPBIGM Negative 12/19/2015      Microbiology:  Recent Results (from the past 240 hour(s))  Surgical pcr screen     Status: None   Collection Time: 02/06/17  2:52 PM  Result Value Ref Range Status   MRSA, PCR NEGATIVE NEGATIVE Final   Staphylococcus aureus NEGATIVE NEGATIVE Final    Comment:        The Xpert SA Assay (FDA approved for NASAL specimens in patients over 10 years of age), is one component of a comprehensive surveillance program.  Test performance has been validated by [facility] for patients greater than or equal to 55 year old. It is not intended to diagnose infection nor to guide or monitor treatment.   MRSA PCR Screening     Status: None   Collection Time: 02/10/17  8:20 PM  Result Value Ref Range Status  MRSA by PCR NEGATIVE NEGATIVE Final    Comment:        The GeneXpert MRSA Assay (FDA approved for NASAL specimens only), is one component of a comprehensive MRSA colonization surveillance program. It is not intended to diagnose MRSA infection nor to guide or monitor treatment for MRSA infections.     Coagulation Studies: No results for input(s): LABPROT, INR in the last 72 hours.  Urinalysis: No results for input(s): COLORURINE, LABSPEC, PHURINE, GLUCOSEU, HGBUR, BILIRUBINUR, KETONESUR, PROTEINUR, UROBILINOGEN, NITRITE, LEUKOCYTESUR in the last 72 hours.  Invalid input(s): APPERANCEUR    Imaging: No results found.   Medications:       Assessment/ Plan:  66 y.o. [place] female with end-stage renal disease on peritoneal dialysis, congestive heart failure, coronary disease, diabetes, hypertension, hyperlipidemia, hypothyroidism.  Dialysis: [facility] [address]: [facility] CCPD 5 x 2.25Litres ; tidal 85%  1.  ESRD on PD. inadequate dialysis as outpatient. Tolerated treatment last night.  - Increased fill volumes to 2.4mL.  - Continue 1.5%  dextrose solution.  2. Anemia of CKD and blood loss: status post 5 units of PRBC transfusion this admission - Appreciate GI input: endoscopy for Thursday  - holding clopidogrel. Monitor platelet count.   3. Secondary hyperparathyroidism with hypocalcemia. Phosphorus at goal - calcium acetate  4. Hypertension.  - Continue amlodipine, carvedilol, and hydralazine.    LOS: 4 Lavonia Dana 7/25/201812:57 PM  [address] [place], [place]

## 2017-02-14 NOTE — Progress Notes (Signed)
Post PD assessment 

## 2017-02-14 NOTE — Progress Notes (Signed)
PD START

## 2017-02-14 NOTE — Progress Notes (Signed)
PD COMPLETE

## 2017-02-14 NOTE — Progress Notes (Signed)
[name] MD, MRCP(U.K) [address]  [place]  [place], [place] [address]  Main: [phone]    [name] is being followed for GI bleed   Subjective: Still has black stools but smaller in qty , no abdominal pain    Objective: Vital signs in last 24 hours: Vitals:   02/13/17 2154 02/13/17 2156 02/14/17 0453 02/14/17 0500  BP: (!) 126/44 (!) 126/44 (!) 114/41   Pulse:  71 72   Resp:   18   Temp:   98.2 F (36.8 C)   TempSrc:   Oral   SpO2:  99% 98%   Weight:    156 lb 1.4 oz (70.8 kg)  Height:       Weight change: -8 lb 6 oz (-3.8 kg)  Intake/Output Summary (Last 24 hours) at 02/14/17 1020 Last data filed at 02/14/17 0845  Gross per 24 hour  Intake           1565.7 ml  Output              646 ml  Net            919.7 ml     Exam: Heart:: Regular rate and rhythm, S1S2 present or without murmur or extra heart sounds Lungs: normal, clear to auscultation and clear to auscultation and percussion Abdomen: soft, nontender, normal bowel sounds   Lab Results: CBC Latest Ref Rng & Units 02/13/2017 02/13/2017 02/12/2017  WBC 3.6 - 11.0 K/uL - 7.2 8.7  Hemoglobin 12.0 - 16.0 g/dL 8.7(L) 7.5(L) 8.1(L)  Hematocrit 35.0 - 47.0 % 25.5(L) 21.5(L) 23.4(L)  Platelets 150 - 440 K/uL - 118(L) 102(L)    Micro Results: Recent Results (from the past 240 hour(s))  Surgical pcr screen     Status: None   Collection Time: 02/06/17  2:52 PM  Result Value Ref Range Status   MRSA, PCR NEGATIVE NEGATIVE Final   Staphylococcus aureus NEGATIVE NEGATIVE Final    Comment:        The Xpert SA Assay (FDA approved for NASAL specimens in patients over 70 years of age), is one component of a comprehensive surveillance program.  Test performance has been validated by [facility] for patients greater than or equal to 45 year old. It is not intended to diagnose infection nor to guide or monitor treatment.   MRSA PCR Screening     Status: None   Collection Time: 02/10/17   8:20 PM  Result Value Ref Range Status   MRSA by PCR NEGATIVE NEGATIVE Final    Comment:        The GeneXpert MRSA Assay (FDA approved for NASAL specimens only), is one component of a comprehensive MRSA colonization surveillance program. It is not intended to diagnose MRSA infection nor to guide or monitor treatment for MRSA infections.    Studies/Results: No results found. Medications: I have reviewed the patient's current medications. Scheduled Meds: . amLODipine  5 mg Oral Daily  . calcium acetate  2,001 mg Oral TID WC  . carvedilol  25 mg Oral BID WC  . furosemide  80 mg Oral BID  . gentamicin cream  1 application Topical Daily  . hydrALAZINE  50 mg Oral BID  . insulin aspart  0-9 Units Subcutaneous TID WC  . levothyroxine  75 mcg Oral QAC breakfast  . mouth rinse  15 mL Mouth Rinse BID  . multivitamin with minerals  1 tablet Oral Daily  . pantoprazole (PROTONIX) IV  40 mg Intravenous Q12H  . vitamin B-12  1,000 mcg Oral Daily   Continuous Infusions: . dialysis solution 1.5% low-MG/low-CA     PRN Meds:.acetaminophen **OR** acetaminophen, heparin, HYDROcodone-acetaminophen, ondansetron (ZOFRAN) IV, senna-docusate   Assessment: Active Problems:   GIB (gastrointestinal bleeding)   Acute blood loss anemia    Bethany Lee 66 y.o. female who was asprin and plavix readmitted with black colored stools.   Plan: 1. EGD+colonoscopy tomorrow off plavix  2. Monitor CBC and transfuse as needed   I have discussed alternative options, risks & benefits,  which include, but are not limited to, bleeding, infection, perforation,respiratory complication & drug reaction.  The patient agrees with this plan & written consent will be obtained.     LOS: 4 days   [name] 02/14/2017, 10:20 AM

## 2017-02-14 NOTE — Progress Notes (Signed)
PD complete with no complications. Pt alert and oriented. 646 ml uf removed. 68.5 kg

## 2017-02-14 NOTE — Progress Notes (Signed)
[place] [facility] [place] NAME: Bethany Lee    MR#:  [id number]  DATE OF BIRTH:  1951/03/03  SUBJECTIVE:   Black colored diarrheal stools REVIEW OF SYSTEMS:   Review of Systems  Constitutional: Negative for chills, fever and weight loss.  HENT: Negative for ear discharge, ear pain and nosebleeds.   Eyes: Negative for blurred vision, pain and discharge.  Respiratory: Negative for sputum production, shortness of breath, wheezing and stridor.   Cardiovascular: Negative for chest pain, palpitations, orthopnea and PND.  Gastrointestinal: Positive for diarrhea and melena. Negative for abdominal pain, nausea and vomiting.  Genitourinary: Negative for frequency and urgency.  Musculoskeletal: Negative for back pain and joint pain.  Neurological: Positive for weakness. Negative for sensory change, speech change and focal weakness.  Psychiatric/Behavioral: Negative for depression and hallucinations. The patient is not nervous/anxious.    Tolerating Diet:clears Tolerating PT: pending  DRUG ALLERGIES:   Allergies  Allergen Reactions  . Penicillins     Childhood loss of consciousness  Has patient had a PCN reaction causing immediate rash, facial/tongue/throat swelling, SOB or lightheadedness with hypotension: Yes Has patient had a PCN reaction causing severe rash involving mucus membranes or skin necrosis: No Has patient had a PCN reaction that required hospitalization Unknown Has patient had a PCN reaction occurring within the last 10 years: Unknown If all of the above answers are "NO", then may proceed with Cephalosporin use.   . Valacyclovir Nausea Only    VITALS:  Blood pressure (!) 126/43, pulse 68, temperature 98 F (36.7 C), temperature source Oral, resp. rate 12, height 5' (1.524 m), weight 70.8 kg (156 lb 1.4 oz), SpO2 99 %.  PHYSICAL EXAMINATION:   Physical Exam  GENERAL:  66 y.o.-year-old patient lying in the bed with no acute  distress. Pallor+ EYES: Pupils equal, round, reactive to light and accommodation. No scleral icterus. Extraocular muscles intact.  HEENT: Head atraumatic, normocephalic. Oropharynx and nasopharynx clear.  NECK:  Supple, no jugular venous distention. No thyroid enlargement, no tenderness.  LUNGS: Normal breath sounds bilaterally, no wheezing, rales, rhonchi. No use of accessory muscles of respiration.  CARDIOVASCULAR: S1, S2 normal. No murmurs, rubs, or gallops.  ABDOMEN: Soft, nontender, nondistended. Bowel sounds present. No organomegaly or mass. PD cath+ EXTREMITIES: No cyanosis, clubbing or edema b/l.    NEUROLOGIC: Cranial nerves II through XII are intact. No focal Motor or sensory deficits b/l.   PSYCHIATRIC:  patient is alert and oriented x 3.  SKIN: No obvious rash, lesion, or ulcer.   LABORATORY PANEL:  CBC  Recent Labs Lab 02/14/17 1018  WBC 6.1  HGB 7.9*  HCT 23.3*  PLT 111*    Chemistries   Recent Labs Lab 02/10/17 1613 02/11/17 0929  NA 135 137  K 5.4* 4.7  CL 96* 101  CO2 24 22  GLUCOSE 185* 121*  BUN 115* 119*  CREATININE 14.01* 13.50*  CALCIUM 7.3* 7.2*  AST 12*  --   ALT 11*  --   ALKPHOS 32*  --   BILITOT 0.7  --    Cardiac Enzymes  Recent Labs Lab 02/14/17 0440  TROPONINI <0.03   RADIOLOGY:  No results found. ASSESSMENT AND PLAN:    66 year old female with history of end-stage renal disease on peritoneal dialysis who was recently admitted to the hospital at the beginning of July for GI bleed and did not have endoscopy due to being on Plavix who presents with black dark-colored stools.  1.  Upper GI bleed: - Stop aspirin and Plavix. -Continue Protonix 40 IV twice a day -GI consult requested. She will need EGD and colonoscopy during this hospital stay.since plavix was stopped 02/10/17---procedure will be scheduled 5 days after that---on thursday  2. Acute on chronic anemia/acute blood loss anemia Patient came in with hemoglobin of 5.8---  4.6---- 4 unit blood transfusion----10---8.1--7.5---1 unit BT--8.7--7.9--1 unit BT Continue PPI  3. ESRD on peritoneal dialysis: Nephrology consult requested and discussed with Dr. Holley Raring - Patient's peritoneal dialysis has been resumed  4. Diabetes: Start sliding scale  5. Essential hypertension: Continue Norvasc and Coreg and hydralazine  6. Hypothyroid: Continue Synthroid  7. CAD with recent stent in Feb 2018 in distal RCA -Spoke with Dr Fath----OK to stop plavix given significant GI bleed from now onwards   Discussed with husband at bedside Case discussed with Care Management/Social Worker. Management plans discussed with the patient, family and they are in agreement.  CODE STATUS: Full DVT Prophylaxis: SCD TOTAL TIME TAKING CARE OF THIS PATIENT: 25 minutes.  >50% time spent on counselling and coordination of care  POSSIBLE D/C IN 1-2 DAYS, DEPENDING ON CLINICAL CONDITION.  Note: This dictation was prepared with Dragon dictation along with smaller phrase technology. Any transcriptional errors that result from this process are unintentional.  Jocelin Schuelke M.D on 02/14/2017 at 5:45 PM  Between 7am to 6pm - Pager - [phone]  After 6pm go to [email or web address] - password [facility]  Office  [phone]  CC: Primary care physician; Tracie Harrier, MD

## 2017-02-15 ENCOUNTER — Inpatient Hospital Stay: Payer: Medicare HMO | Admitting: Anesthesiology

## 2017-02-15 ENCOUNTER — Inpatient Hospital Stay
Admission: EM | Disposition: A | Discharge status: Home / Self Care | Payer: Self-pay | Source: Home / Self Care | Attending: Internal Medicine

## 2017-02-15 ENCOUNTER — Encounter: Payer: Self-pay | Admitting: *Deleted

## 2017-02-15 DIAGNOSIS — Z79899 Other long term (current) drug therapy: Secondary | ICD-10-CM

## 2017-02-15 DIAGNOSIS — D509 Iron deficiency anemia, unspecified: Secondary | ICD-10-CM

## 2017-02-15 DIAGNOSIS — I132 Hypertensive heart and chronic kidney disease with heart failure and with stage 5 chronic kidney disease, or end stage renal disease: Secondary | ICD-10-CM

## 2017-02-15 DIAGNOSIS — N186 End stage renal disease: Secondary | ICD-10-CM

## 2017-02-15 DIAGNOSIS — E1122 Type 2 diabetes mellitus with diabetic chronic kidney disease: Secondary | ICD-10-CM

## 2017-02-15 DIAGNOSIS — K2980 Duodenitis without bleeding: Secondary | ICD-10-CM

## 2017-02-15 DIAGNOSIS — E039 Hypothyroidism, unspecified: Secondary | ICD-10-CM

## 2017-02-15 DIAGNOSIS — Z87891 Personal history of nicotine dependence: Secondary | ICD-10-CM

## 2017-02-15 DIAGNOSIS — K640 First degree hemorrhoids: Secondary | ICD-10-CM

## 2017-02-15 DIAGNOSIS — I2510 Atherosclerotic heart disease of native coronary artery without angina pectoris: Secondary | ICD-10-CM

## 2017-02-15 DIAGNOSIS — R740 Nonspecific elevation of levels of transaminase and lactic acid dehydrogenase [LDH]: Secondary | ICD-10-CM

## 2017-02-15 DIAGNOSIS — G629 Polyneuropathy, unspecified: Secondary | ICD-10-CM

## 2017-02-15 DIAGNOSIS — I509 Heart failure, unspecified: Secondary | ICD-10-CM

## 2017-02-15 DIAGNOSIS — K621 Rectal polyp: Secondary | ICD-10-CM

## 2017-02-15 DIAGNOSIS — R7989 Other specified abnormal findings of blood chemistry: Secondary | ICD-10-CM

## 2017-02-15 DIAGNOSIS — Z992 Dependence on renal dialysis: Secondary | ICD-10-CM

## 2017-02-15 HISTORY — PX: COLONOSCOPY WITH PROPOFOL: SHX5780

## 2017-02-15 HISTORY — PX: ESOPHAGOGASTRODUODENOSCOPY (EGD) WITH PROPOFOL: SHX5813

## 2017-02-15 LAB — POCT I-STAT 4, (NA,K, GLUC, HGB,HCT)
Glucose, Bld: 82 mg/dL (ref 65–99)
HCT: 23 % — ABNORMAL LOW (ref 36.0–46.0)
HEMOGLOBIN: 7.8 g/dL — AB (ref 12.0–15.0)
Potassium: 4.8 mmol/L (ref 3.5–5.1)
Sodium: 131 mmol/L — ABNORMAL LOW (ref 135–145)

## 2017-02-15 LAB — LACTATE DEHYDROGENASE: LDH: 212 U/L — AB (ref 98–192)

## 2017-02-15 LAB — TYPE AND SCREEN
ABO/RH(D): A POS
Antibody Screen: NEGATIVE
Unit division: 0

## 2017-02-15 LAB — GLUCOSE, CAPILLARY
GLUCOSE-CAPILLARY: 86 mg/dL (ref 65–99)
GLUCOSE-CAPILLARY: 123 mg/dL — AB (ref 65–99)
GLUCOSE-CAPILLARY: 111 mg/dL — AB (ref 65–99)
Glucose-Capillary: 92 mg/dL (ref 65–99)
Glucose-Capillary: 145 mg/dL — ABNORMAL HIGH (ref 65–99)
Glucose-Capillary: 181 mg/dL — ABNORMAL HIGH (ref 65–99)

## 2017-02-15 LAB — BPAM RBC
Blood Product Expiration Date: 201808042359
ISSUE DATE / TIME: 201807251616
Unit Type and Rh: 600

## 2017-02-15 LAB — RETICULOCYTES
RBC.: 3.3 MIL/uL — AB (ref 3.80–5.20)
RETIC COUNT ABSOLUTE: 174.9 10*3/uL (ref 19.0–183.0)
RETIC CT PCT: 5.3 % — AB (ref 0.4–3.1)

## 2017-02-15 LAB — HEMOGLOBIN: HEMOGLOBIN: 9.3 g/dL — AB (ref 12.0–16.0)

## 2017-02-15 SURGERY — ESOPHAGOGASTRODUODENOSCOPY (EGD) WITH PROPOFOL
Anesthesia: General

## 2017-02-15 MED ORDER — PROPOFOL 500 MG/50ML IV EMUL
INTRAVENOUS | Status: DC | PRN
  Administered 2017-02-15 (×2): 100 ug/kg/min via INTRAVENOUS

## 2017-02-15 MED ORDER — DEXTROSE-NACL 5-0.45 % IV SOLN
INTRAVENOUS | Status: DC
  Administered 2017-02-15: 09:00:00 via INTRAVENOUS

## 2017-02-15 MED ORDER — GLYCOPYRROLATE 0.2 MG/ML IJ SOLN
INTRAMUSCULAR | Status: DC | PRN
  Administered 2017-02-15: 0.1 mg via INTRAVENOUS

## 2017-02-15 MED ORDER — IPRATROPIUM-ALBUTEROL 0.5-2.5 (3) MG/3ML IN SOLN
RESPIRATORY_TRACT | Status: AC
  Filled 2017-02-15: qty 3

## 2017-02-15 MED ORDER — PROPOFOL 500 MG/50ML IV EMUL
INTRAVENOUS | Status: AC
  Filled 2017-02-15: qty 50

## 2017-02-15 MED ORDER — GLYCOPYRROLATE 0.2 MG/ML IJ SOLN
INTRAMUSCULAR | Status: AC
  Filled 2017-02-15: qty 1

## 2017-02-15 MED ORDER — BUTAMBEN-TETRACAINE-BENZOCAINE 2-2-14 % EX AERO
INHALATION_SPRAY | CUTANEOUS | Status: DC | PRN
  Administered 2017-02-15: 2 via TOPICAL

## 2017-02-15 MED ORDER — MIDAZOLAM HCL 2 MG/2ML IJ SOLN
INTRAMUSCULAR | Status: AC
  Filled 2017-02-15: qty 2

## 2017-02-15 MED ORDER — FENTANYL CITRATE (PF) 100 MCG/2ML IJ SOLN
INTRAMUSCULAR | Status: AC
  Filled 2017-02-15: qty 2

## 2017-02-15 MED ORDER — LIDOCAINE HCL (CARDIAC) 20 MG/ML IV SOLN
INTRAVENOUS | Status: DC | PRN
  Administered 2017-02-15: 30 mg via INTRAVENOUS

## 2017-02-15 MED ORDER — FENTANYL CITRATE (PF) 100 MCG/2ML IJ SOLN
INTRAMUSCULAR | Status: DC | PRN
  Administered 2017-02-15: 50 ug via INTRAVENOUS

## 2017-02-15 MED ORDER — MIDAZOLAM HCL 2 MG/2ML IJ SOLN
INTRAMUSCULAR | Status: DC | PRN
  Administered 2017-02-15: 2 mg via INTRAVENOUS

## 2017-02-15 MED ORDER — BUTAMBEN-TETRACAINE-BENZOCAINE 2-2-14 % EX AERO
INHALATION_SPRAY | CUTANEOUS | Status: AC
  Filled 2017-02-15: qty 20

## 2017-02-15 MED ORDER — IPRATROPIUM-ALBUTEROL 0.5-2.5 (3) MG/3ML IN SOLN
3.0000 mL | Freq: Once | RESPIRATORY_TRACT | Status: AC
  Administered 2017-02-15: 10:00:00 via RESPIRATORY_TRACT

## 2017-02-15 MED ORDER — LIDOCAINE HCL (PF) 2 % IJ SOLN
INTRAMUSCULAR | Status: AC
  Filled 2017-02-15: qty 2

## 2017-02-15 NOTE — Op Note (Signed)
[facility] Patient Name: Bethany Lee Procedure Date: 02/15/2017 11:02 AM MRN: [id number] Account #: [id number] Date of Birth: 1951-03-04 Admit Type: Inpatient Age: 66 Room: [facility] ENDO ROOM 4 Gender: Female Note Status: Finalized Procedure:            Upper GI endoscopy Indications:          Acute post hemorrhagic anemia Providers:            [name] MD, MD Referring MD:         Tracie Harrier, MD (Referring MD) Medicines:            Monitored Anesthesia Care Complications:        No immediate complications. Procedure:            Pre-Anesthesia Assessment:                       - Prior to the procedure, a History and Physical was                        performed, and patient medications, allergies and                        sensitivities were reviewed. The patient's tolerance of                        previous anesthesia was reviewed.                       - The risks and benefits of the procedure and the                        sedation options and risks were discussed with the                        patient. All questions were answered and informed                        consent was obtained.                       - ASA Grade Assessment: III - A patient with severe                        systemic disease.                       After obtaining informed consent, the endoscope was                        passed under direct vision. Throughout the procedure,                        the patient's blood pressure, pulse, and oxygen                        saturations were monitored continuously. The Endoscope                        was introduced through the mouth, and advanced to the  third part of duodenum. The upper GI endoscopy was                        accomplished with ease. The patient tolerated the                        procedure poorly due to the patient's respiratory                        instability  (hypoxia). Findings:      The esophagus was normal.      The stomach was normal.      Patchy moderately erythematous mucosa without active bleeding and with       no stigmata of bleeding was found in the duodenal bulb. Impression:           - Normal esophagus.                       - Normal stomach.                       - Erythematous duodenopathy.                       - No specimens collected. Recommendation:       - Perform a colonoscopy today. Procedure Code(s):    --- Professional ---                       [phone], Esophagogastroduodenoscopy, flexible, transoral;                        diagnostic, including collection of specimen(s) by                        brushing or washing, when performed (separate procedure) Diagnosis Code(s):    --- Professional ---                       K31.89, Other diseases of stomach and duodenum                       D62, Acute posthemorrhagic anemia CPT copyright 2016 American Medical Association. All rights reserved. The codes documented in this report are preliminary and upon coder review may  be revised to meet current compliance requirements. [name], MD [name] MD, MD 02/15/2017 11:21:48 AM This report has been signed electronically. Number of Addenda: 0 Note Initiated On: 02/15/2017 11:02 AM      [facility]

## 2017-02-15 NOTE — Anesthesia Preprocedure Evaluation (Signed)
Anesthesia Evaluation  Patient identified by MRN, date of birth, ID band Patient awake    Reviewed: Allergy & Precautions, H&P , NPO status , Patient's Chart, lab work & pertinent test results  History of Anesthesia Complications (+) PONV and history of anesthetic complications  Airway Mallampati: III  TM Distance: <3 FB Neck ROM: limited    Dental  (+) Poor Dentition, Missing, Upper Dentures, Lower Dentures   Pulmonary shortness of breath and with exertion, former smoker,           Cardiovascular hypertension, (-) angina+ CAD, + Cardiac Stents and +CHF       Neuro/Psych negative neurological ROS  negative psych ROS   GI/Hepatic negative GI ROS, Neg liver ROS,   Endo/Other  diabetesHypothyroidism   Renal/GU Renal diseasenegative Renal ROS  negative genitourinary   Musculoskeletal   Abdominal   Peds  Hematology negative hematology ROS (+)   Anesthesia Other Findings Past Medical History: No date: Anemia No date: CHF (congestive heart failure) (HCC) No date: Chronic kidney disease     Comment:  peritoneal dialysis No date: Complication of anesthesia No date: Coronary artery disease No date: Diabetes mellitus without complication ([place]) No date: Dialysis patient [facility])     Comment:  Peritoneal dialysis patient No date: Dyspnea     Comment:  doe No date: Edema 01/2017: History of recent blood transfusion No date: Hypercholesterolemia No date: Hypertension No date: Hypothyroidism No date: Neuropathy No date: PONV (postoperative nausea and vomiting) No date: Shingles     Comment:  October-November 2017 No date: Tremors of nervous system     Comment:  intermittent when taking gabapentin  Past Surgical History: 01/16/2017: A/V SHUNT INTERVENTION; N/A     Comment:  Procedure: A/V Shunt Intervention;  Surgeon: Katha Cabal, MD;  Location: [place] [facility];  Service:  Cardiovascular;  Laterality: N/A; 01/16/2017: A/V SHUNTOGRAM; Left     Comment:  Procedure: A/V Fistulagram;  Surgeon: Katha Cabal, MD;  Location: [place] [facility];  Service:               Cardiovascular;  Laterality: Left; No date: ABDOMINAL HYSTERECTOMY 11/30/2016: AV FISTULA PLACEMENT; Left     Comment:  Procedure: ARTERIOVENOUS (AV) FISTULA CREATION ( EXPLORE              FOR CREATION BRACHIOCEPHALIC);  Surgeon: Algernon Huxley,               MD;  Location: [facility];  Service: Vascular;  Laterality:              Left; 11/25/2015: CAPD INSERTION; N/A     Comment:  Procedure: LAPAROSCOPIC INSERTION CONTINUOUS AMBULATORY               PERITONEAL DIALYSIS  (CAPD) CATHETER;  Surgeon: Algernon Huxley, MD;  Location: [facility];  Service: Vascular;                Laterality: N/A; 01/06/2016: CAPD INSERTION; N/A     Comment:  Procedure: LAPAROSCOPIC INSERTION CONTINUOUS AMBULATORY               PERITONEAL DIALYSIS  (CAPD) CATHETER REVISION ;  Surgeon:  Algernon Huxley, MD;  Location: [facility];  Service: Vascular;              Laterality: N/A; 10/11/2016: CAPD INSERTION; N/A     Comment:  Procedure: LAPAROSCOPIC INSERTION CONTINUOUS AMBULATORY               PERITONEAL DIALYSIS  (CAPD) CATHETER ( REVISION );                Surgeon: Algernon Huxley, MD;  Location: [facility];  Service:               Vascular;  Laterality: N/A; No date: CARDIAC CATHETERIZATION 05/25/2016: CATARACT EXTRACTION W/PHACO; Right     Comment:  Procedure: CATARACT EXTRACTION PHACO AND INTRAOCULAR               LENS PLACEMENT ([place]);  Surgeon: Eulogio Bear, MD;                Location: [facility];  Service: Ophthalmology;  Laterality:              Right;  Lot # [id number] H [place]: 00:5.2 AP%: 11.3 CDE: 7.21 No date: CORONARY ANGIOPLASTY 09/07/2016: CORONARY STENT INTERVENTION; N/A     Comment:  Procedure: Coronary Stent Intervention;  Surgeon:               Isaias Cowman, MD;  Location:  [place] [facility];              Service: Cardiovascular;  Laterality: N/A; 09/21/2016: DIALYSIS/PERMA CATHETER INSERTION; N/A     Comment:  Procedure: Dialysis/Perma Catheter Insertion;  Surgeon:               Algernon Huxley, MD;  Location: [place] [facility];                Service: Cardiovascular;  Laterality: N/A; 11/30/2016: DIALYSIS/PERMA CATHETER REMOVAL; Right     Comment:  Procedure: DIALYSIS/PERMA CATHETER REMOVAL;  Surgeon:               Algernon Huxley, MD;  Location: [facility];  Service:               Vascular;  Laterality: Right; No date: EYE SURGERY 09/07/2016: LEFT HEART CATH AND CORONARY ANGIOGRAPHY; Left     Comment:  Procedure: Left Heart Cath and Coronary Angiography;                Surgeon: Isaias Cowman, MD;  Location: [place] [facility];  Service: Cardiovascular;  Laterality:               Left; 02/07/2017: LIGATION OF ARTERIOVENOUS  FISTULA; Left     Comment:  Procedure: LIGATION OF ARTERIOVENOUS  FISTULA ( BANDING               BRACHIAL CEPHALIC );  Surgeon: Katha Cabal, MD;                Location: [facility];  Service: Vascular;  Laterality:               Left; No date: TUBAL LIGATION 01/19/2017: UPPER EXTREMITY ANGIOGRAPHY; Left     Comment:  Procedure: Upper Extremity Angiography;  Surgeon:               Katha Cabal, MD;  Location: [place] [facility];  Service: Cardiovascular;  Laterality: Left;  BMI    Body Mass Index:  30.54 kg/m      Reproductive/Obstetrics negative OB ROS                             Anesthesia Physical Anesthesia Plan  ASA: III  Anesthesia Plan: General   Post-op Pain Management:    Induction: Intravenous  PONV Risk Score and Plan:   Airway Management Planned: Natural Airway and Nasal Cannula  Additional Equipment:   Intra-op Plan:   Post-operative Plan:   Informed Consent: I have reviewed the patients History and Physical, chart, labs and discussed  the procedure including the risks, benefits and alternatives for the proposed anesthesia with the patient or authorized representative who has indicated his/her understanding and acceptance.   Dental Advisory Given  Plan Discussed with: Anesthesiologist, CRNA and Surgeon  Anesthesia Plan Comments: (Patient has cardiac clearance for this procedure.   Patient informed that they are higher risk for complications from anesthesia during this procedure due to their medical history.  Patient voiced understanding.  Patient consented for risks of anesthesia including but not limited to:  - adverse reactions to medications - risk of intubation if required - damage to teeth, lips or other oral mucosa - sore throat or hoarseness - Damage to heart, brain, lungs or loss of life  Patient voiced understanding.)        Anesthesia Quick Evaluation

## 2017-02-15 NOTE — Progress Notes (Signed)
[place] [facility] [place] NAME: Bethany Lee    MR#:  [id number]  DATE OF BIRTH:  Dec 07, 1950  SUBJECTIVE:   Patient underwent a colonoscopy and endoscopy earlier her hemoglobin again this morning is 7.8   REVIEW OF SYSTEMS:   Review of Systems  Constitutional: Negative for chills, fever and weight loss.  HENT: Negative for ear discharge, ear pain and nosebleeds.   Eyes: Negative for blurred vision, pain and discharge.  Respiratory: Negative for sputum production, shortness of breath, wheezing and stridor.   Cardiovascular: Negative for chest pain, palpitations, orthopnea and PND.  Gastrointestinal: Positive for diarrhea and melena. Negative for abdominal pain, nausea and vomiting.  Genitourinary: Negative for frequency and urgency.  Musculoskeletal: Negative for back pain and joint pain.  Neurological: Positive for weakness. Negative for sensory change, speech change and focal weakness.  Psychiatric/Behavioral: Negative for depression and hallucinations. The patient is not nervous/anxious.    Tolerating Diet:clears Tolerating PT: pending  DRUG ALLERGIES:   Allergies  Allergen Reactions  . Penicillins     Childhood loss of consciousness  Has patient had a PCN reaction causing immediate rash, facial/tongue/throat swelling, SOB or lightheadedness with hypotension: Yes Has patient had a PCN reaction causing severe rash involving mucus membranes or skin necrosis: No Has patient had a PCN reaction that required hospitalization Unknown Has patient had a PCN reaction occurring within the last 10 years: Unknown If all of the above answers are "NO", then may proceed with Cephalosporin use.   . Valacyclovir Nausea Only    VITALS:  Blood pressure (!) 142/45, pulse 80, temperature 98.3 F (36.8 C), temperature source Oral, resp. rate 12, height 5' (1.524 m), weight 156 lb (70.8 kg), SpO2 100 %.  PHYSICAL EXAMINATION:   Physical  Exam  GENERAL:  66 y.o.-year-old patient lying in the bed with no acute distress. Pallor+ EYES: Pupils equal, round, reactive to light and accommodation. No scleral icterus. Extraocular muscles intact.  HEENT: Head atraumatic, normocephalic. Oropharynx and nasopharynx clear.  NECK:  Supple, no jugular venous distention. No thyroid enlargement, no tenderness.  LUNGS: Normal breath sounds bilaterally, no wheezing, rales, rhonchi. No use of accessory muscles of respiration.  CARDIOVASCULAR: S1, S2 normal. No murmurs, rubs, or gallops.  ABDOMEN: Soft, nontender, nondistended. Bowel sounds present. No organomegaly or mass. PD cath+ EXTREMITIES: No cyanosis, clubbing or edema b/l.    NEUROLOGIC: Cranial nerves II through XII are intact. No focal Motor or sensory deficits b/l.   PSYCHIATRIC:  patient is alert and oriented x 3.  SKIN: No obvious rash, lesion, or ulcer.   LABORATORY PANEL:  CBC  Recent Labs Lab 02/14/17 1018  02/15/17 0942  WBC 6.1  --   --   HGB 7.9*  < > 7.8*  HCT 23.3*  --  23.0*  PLT 111*  --   --   < > = values in this interval not displayed.  Chemistries   Recent Labs Lab 02/10/17 1613 02/11/17 0929 02/15/17 0942  NA 135 137 131*  K 5.4* 4.7 4.8  CL 96* 101  --   CO2 24 22  --   GLUCOSE 185* 121* 82  BUN 115* 119*  --   CREATININE 14.01* 13.50*  --   CALCIUM 7.3* 7.2*  --   AST 12*  --   --   ALT 11*  --   --   ALKPHOS 32*  --   --   BILITOT 0.7  --   --  Cardiac Enzymes  Recent Labs Lab 02/14/17 0440  TROPONINI <0.03   RADIOLOGY:  No results found. ASSESSMENT AND PLAN:    66 year old female with history of end-stage renal disease on peritoneal dialysis who was recently admitted to the hospital at the beginning of July for GI bleed and did not have endoscopy due to being on Plavix who presents with black dark-colored stools.  1. Upper GI bleed: -Continue Protonix 40 IV twice a day -Colonoscopy and endoscopy shows no evidence of  bleeding GI recommended outpatient capsule endoscopy  2. Acute on chronic anemia/acute blood loss anemia Patient came in with hemoglobin of 5.8-status post 6 units of blood transfusion and still anemic Continue PPI I will do a haptoglobin globulin retake count and LDH I will have hematology see the patient Discussed with GI regarding capsule endoscopy they said that she has a lot of stool in her colon  3. ESRD on peritoneal dialysis: Neurology follow - Patient's peritoneal dialysis has been resumed  4. Diabetes: Start sliding scale  5. Essential hypertension: Continue Norvasc and Coreg and hydralazine  6. Hypothyroid: Continue Synthroid  7. CAD with recent stent in Feb 2018 in distal RCA -Spoke with Dr Fath----OK to stop plavix given significant GI bleed from now onwards   Discussed with husband at bedside Case discussed with Care Management/Social Worker. Management plans discussed with the patient, family and they are in agreement.  CODE STATUS: Full DVT Prophylaxis: SCD TOTAL TIME TAKING CARE OF THIS PATIENT: 25 minutes.  >50% time spent on counselling and coordination of care  POSSIBLE D/C IN 1-2 DAYS, DEPENDING ON CLINICAL CONDITION.  Note: This dictation was prepared with Dragon dictation along with smaller phrase technology. Any transcriptional errors that result from this process are unintentional.  Dustin Flock M.D on 02/15/2017 at 1:46 PM  Between 7am to 6pm - Pager - [phone]  After 6pm go to [email or web address] - password [facility]  Office  [phone]  CC: Primary care physician; Tracie Harrier, MD

## 2017-02-15 NOTE — Op Note (Signed)
[facility] Patient Name: Bethany Lee Procedure Date: 02/15/2017 10:59 AM MRN: [id number] Account #: [id number] Date of Birth: July 06, 1951 Admit Type: Inpatient Age: 66 Room: [facility] ENDO ROOM 4 Gender: Female Note Status: Finalized Procedure:            Colonoscopy Indications:          Hematochezia Providers:            [name] MD, MD Medicines:            Monitored Anesthesia Care Complications:        No immediate complications. Procedure:            Pre-Anesthesia Assessment:                       - Prior to the procedure, a History and Physical was                        performed, and patient medications, allergies and                        sensitivities were reviewed. The patient's tolerance of                        previous anesthesia was reviewed.                       - The risks and benefits of the procedure and the                        sedation options and risks were discussed with the                        patient. All questions were answered and informed                        consent was obtained.                       - ASA Grade Assessment: III - A patient with severe                        systemic disease.                       After obtaining informed consent, the colonoscope was                        passed under direct vision. Throughout the procedure,                        the patient's blood pressure, pulse, and oxygen                        saturations were monitored continuously. The Olympus                        CF-H180AL colonoscope ( S#: [id number] ) was introduced                        through the anus and advanced to the the cecum,  identified by the appendiceal orifice, IC valve and                        transillumination. The colonoscopy was performed with                        ease. The patient tolerated the procedure well. The                        quality of the bowel preparation  was poor. Findings:      The perianal and digital rectal examinations were normal.      A moderate amount of semi-liquid stool was found in the entire colon,       interfering with visualization.      Non-bleeding internal hemorrhoids were found during retroflexion. The       hemorrhoids were small and Grade I (internal hemorrhoids that do not       prolapse).      A 5 mm polyp was found in the rectum. The polyp was sessile. The polyp       was removed with a cold snare. Resection and retrieval were complete.      Brown stool seen through out the colon . No blood seen , tiny AVM's may       have been missed but she has no active bleeding Impression:           - Preparation of the colon was poor.                       - Stool in the entire examined colon.                       - Non-bleeding internal hemorrhoids.                       - One 5 mm polyp in the rectum, removed with a cold                        snare. Resected and retrieved. Recommendation:       - Return patient to hospital ward for ongoing care.                       - Advance diet as tolerated.                       - Continue present medications.                       - No blood seen on EGD or in colon , presently not                        actively bleeding .                       Would need outpatiet capsule study of the small bowel                        to r/o any lesions.                       F/u in GI clinic as outpatient Procedure Code(s):    ---  Professional ---                       [phone], Colonoscopy, flexible; with removal of tumor(s),                        polyp(s), or other lesion(s) by snare technique Diagnosis Code(s):    --- Professional ---                       K64.0, First degree hemorrhoids                       K62.1, Rectal polyp                       K92.1, Melena (includes Hematochezia) CPT copyright 2016 American Medical Association. All rights reserved. The codes documented in this report  are preliminary and upon coder review may  be revised to meet current compliance requirements. [name], MD [name] MD, MD 02/15/2017 11:45:35 AM This report has been signed electronically. Number of Addenda: 0 Note Initiated On: 02/15/2017 10:59 AM Scope Withdrawal Time: 0 hours 8 minutes 7 seconds  Total Procedure Duration: 0 hours 12 minutes 31 seconds       [facility]

## 2017-02-15 NOTE — Transfer of Care (Signed)
Immediate Anesthesia Transfer of Care Note  Patient: Bethany Lee  Procedure(s) Performed: Procedure(s): ESOPHAGOGASTRODUODENOSCOPY (EGD) WITH PROPOFOL (N/A) COLONOSCOPY WITH PROPOFOL (N/A)  Patient Location: PACU  Anesthesia Type:General  Level of Consciousness: awake and sedated  Airway & Oxygen Therapy: Patient Spontanous Breathing and Patient connected to nasal cannula oxygen  Post-op Assessment: Report given to RN and Post -op Vital signs reviewed and stable  Post vital signs: Reviewed and stable  Last Vitals:  Vitals:   02/15/17 0452 02/15/17 0918  BP: (!) 132/54 (!) 161/51  Pulse: 78 74  Resp: 19 16  Temp: 36.7 C 36.8 C    Last Pain:  Vitals:   02/15/17 0918  TempSrc: Tympanic  PainSc:          Complications: No apparent anesthesia complications

## 2017-02-15 NOTE — H&P (Signed)
[name] MD [address] [place], [place] [address] Phone: [phone] Fax : [phone]  Primary Care Physician:  Tracie Harrier, MD Primary Gastroenterologist:  Dr. [name]   Pre-Procedure History & Physical: HPI:  Bethany Lee is a 66 y.o. female is here for an endoscopy and colonoscopy.   Past Medical History:  Diagnosis Date  . Anemia   . CHF (congestive heart failure) ([place])   . Chronic kidney disease    peritoneal dialysis  . Complication of anesthesia   . Coronary artery disease   . Diabetes mellitus without complication ([place])   . Dialysis patient [facility])    Peritoneal dialysis patient  . Dyspnea    doe  . Edema   . History of recent blood transfusion 01/2017  . Hypercholesterolemia   . Hypertension   . Hypothyroidism   . Neuropathy   . PONV (postoperative nausea and vomiting)   . Shingles    October-November 2017  . Tremors of nervous system    intermittent when taking gabapentin    Past Surgical History:  Procedure Laterality Date  . A/V SHUNT INTERVENTION N/A 01/16/2017   Procedure: A/V Shunt Intervention;  Surgeon: Katha Cabal, MD;  Location: [place] [facility];  Service: Cardiovascular;  Laterality: N/A;  . A/V SHUNTOGRAM Left 01/16/2017   Procedure: A/V Fistulagram;  Surgeon: Katha Cabal, MD;  Location: [place] [facility];  Service: Cardiovascular;  Laterality: Left;  . ABDOMINAL HYSTERECTOMY    . AV FISTULA PLACEMENT Left 11/30/2016   Procedure: ARTERIOVENOUS (AV) FISTULA CREATION ( EXPLORE FOR CREATION BRACHIOCEPHALIC);  Surgeon: Algernon Huxley, MD;  Location: [facility] OR[facility];  Service: Vascular;  Laterality: Left;  . CAPD INSERTION N/A 11/25/2015   Procedure: LAPAROSCOPIC INSERTION CONTINUOUS AMBULATORY PERITONEAL DIALYSIS  (CAPD) CATHETER;  Surgeon: Algernon Huxley, MD;  Location: [facility] OR[facility];  Service: Vascular;  Laterality: N/A;  . CAPD INSERTION N/A 01/06/2016   Procedure: LAPAROSCOPIC INSERTION CONTINUOUS AMBULATORY  PERITONEAL DIALYSIS  (CAPD) CATHETER REVISION ;  Surgeon: Algernon Huxley, MD;  Location: [facility] OR[facility];  Service: Vascular;  Laterality: N/A;  . CAPD INSERTION N/A 10/11/2016   Procedure: LAPAROSCOPIC INSERTION CONTINUOUS AMBULATORY PERITONEAL DIALYSIS  (CAPD) CATHETER ( REVISION );  Surgeon: Algernon Huxley, MD;  Location: [facility] OR[facility];  Service: Vascular;  Laterality: N/A;  . CARDIAC CATHETERIZATION    . CATARACT EXTRACTION W/PHACO Right 05/25/2016   Procedure: CATARACT EXTRACTION PHACO AND INTRAOCULAR LENS PLACEMENT (IOC);  Surgeon: Eulogio Bear, MD;  Location: [facility] OR[facility];  Service: Ophthalmology;  Laterality: Right;  Lot # [id number] H [place]: 00:5.2 AP%: 11.3 CDE: 7.21  . CORONARY ANGIOPLASTY    . CORONARY STENT INTERVENTION N/A 09/07/2016   Procedure: Coronary Stent Intervention;  Surgeon: Isaias Cowman, MD;  Location: [place] [facility];  Service: Cardiovascular;  Laterality: N/A;  . DIALYSIS/PERMA CATHETER INSERTION N/A 09/21/2016   Procedure: Dialysis/Perma Catheter Insertion;  Surgeon: Algernon Huxley, MD;  Location: [place] [facility];  Service: Cardiovascular;  Laterality: N/A;  . DIALYSIS/PERMA CATHETER REMOVAL Right 11/30/2016   Procedure: DIALYSIS/PERMA CATHETER REMOVAL;  Surgeon: Algernon Huxley, MD;  Location: [facility];  Service: Vascular;  Laterality: Right;  . EYE SURGERY    . LEFT HEART CATH AND CORONARY ANGIOGRAPHY Left 09/07/2016   Procedure: Left Heart Cath and Coronary Angiography;  Surgeon: Isaias Cowman, MD;  Location: [place] [facility];  Service: Cardiovascular;  Laterality: Left;  . LIGATION OF ARTERIOVENOUS  FISTULA Left 02/07/2017   Procedure: LIGATION OF  ARTERIOVENOUS  FISTULA ( BANDING BRACHIAL CEPHALIC );  Surgeon: Katha Cabal, MD;  Location: [facility];  Service: Vascular;  Laterality: Left;  . TUBAL LIGATION    . UPPER EXTREMITY ANGIOGRAPHY Left 01/19/2017   Procedure: Upper Extremity Angiography;  Surgeon: Katha Cabal, MD;  Location: [place] [facility];   Service: Cardiovascular;  Laterality: Left;    Prior to Admission medications   Medication Sig Start Date End Date Taking? Authorizing Provider  albuterol (PROVENTIL HFA;VENTOLIN HFA) 108 (90 Base) MCG/ACT inhaler Inhale 2 puffs into the lungs every 6 (six) hours as needed for wheezing or shortness of breath.   Yes [provider]  amLODipine (NORVASC) 5 MG tablet Take 5 mg by mouth daily.   Yes [provider]  aspirin EC 81 MG tablet Take 1 tablet (81 mg total) by mouth daily. Start taking after NOV 15 Patient taking differently: Take 81 mg by mouth daily.  05/26/15  Yes Murlean Iba, MD  carvedilol (COREG) 12.5 MG tablet Take 1 tablet (12.5 mg total) by mouth 2 (two) times daily with a meal. Patient taking differently: Take 25 mg by mouth 2 (two) times daily with a meal.  07/20/16  Yes Fritzi Mandes, MD  clopidogrel (PLAVIX) 75 MG tablet Take 1 tablet (75 mg total) by mouth daily with breakfast. 09/08/16  Yes Paraschos, Alexander, MD  furosemide (LASIX) 80 MG tablet Take 80 mg by mouth 2 (two) times daily.   Yes [provider]  gentamicin cream (GARAMYCIN) 0.1 % Apply 1 application topically daily. Patient taking differently: Apply 1 application topically daily. Catheter dressing changes 12/21/15  Yes Theodoro Grist, MD  hydrALAZINE (APRESOLINE) 50 MG tablet Take 50 mg by mouth 2 (two) times daily.   Yes [provider]  Insulin Glargine (TOUJEO SOLOSTAR) 300 UNIT/ML SOPN Inject 20 Units into the skin at bedtime. Patient taking differently: Inject 24 Units into the skin at bedtime.  12/21/15  Yes Theodoro Grist, MD  lovastatin (MEVACOR) 40 MG tablet Take 40 mg by mouth at bedtime.   Yes [provider]  metolazone (ZAROXOLYN) 5 MG tablet Take 5 mg by mouth daily.    Yes [provider]  Multiple Vitamins-Minerals (DIALYVITE 800/ULTRA D) TABS Take 1 tablet by mouth daily.   Yes [provider]  pantoprazole (PROTONIX) 40 MG tablet  Take 1 tablet (40 mg total) by mouth 2 (two) times daily. 01/31/17  Yes Max Sane, MD  Pyridoxine HCl (VITAMIN B6 PO) Take by mouth.   Yes [provider]  traMADol (ULTRAM) 50 MG tablet Take 1 tablet (50 mg total) by mouth every 8 (eight) hours as needed for moderate pain or severe pain. 02/07/17  Yes Schnier, Dolores Lory, MD  vitamin B-12 (CYANOCOBALAMIN) 1000 MCG tablet Take 1,000 mcg by mouth daily.   Yes [provider]  calcium acetate (PHOSLO) 667 MG capsule Take 2,001 mg by mouth 3 (three) times daily with meals.    [provider]  insulin aspart (NOVOLOG FLEXPEN) 100 UNIT/ML FlexPen Inject 4-8 Units into the skin 3 (three) times daily with meals. Sliding scale below 200 take 4 units, 200-250=6 units, >250= 8 units 09/13/15   [provider]  levothyroxine (SYNTHROID, LEVOTHROID) 75 MCG tablet Take 75 mcg by mouth daily before breakfast.    [provider]    Allergies as of 02/10/2017 - Review Complete 02/10/2017  Allergen Reaction Noted  . Penicillins  05/25/2015  . Valacyclovir Nausea Only 05/10/2016    History  reviewed. No pertinent family history.  Social History   Social History  . Marital status: Married    Spouse name: N/A  . Number of children: N/A  . Years of education: N/A   Occupational History  . Not on file.   Social History Main Topics  . Smoking status: Former Smoker    Quit date: 10/09/2008  . Smokeless tobacco: Never Used  . Alcohol use No  . Drug use: No  . Sexual activity: Yes   Other Topics Concern  . Not on file   Social History Narrative  . No narrative on file    Review of Systems: See HPI, otherwise negative ROS  Physical Exam: BP (!) 161/51   Pulse 74   Temp 98.2 F (36.8 C) (Tympanic)   Resp 16   Ht 5' (1.524 m)   Wt 156 lb (70.8 kg)   SpO2 97%   BMI 30.47 kg/m  General:   Alert,  pleasant and cooperative in NAD Head:  Normocephalic and atraumatic. Neck:  Supple; no masses or  thyromegaly. Lungs:  Clear throughout to auscultation.    Heart:  Regular rate and rhythm. Abdomen:  Soft, nontender and nondistended. Normal bowel sounds, without guarding, and without rebound.   Neurologic:  Alert and  oriented x4;  grossly normal neurologically.  Impression/Plan: Bethany Lee is here for an endoscopy and colonoscopy to be performed for gi bleeding   Risks, benefits, limitations, and alternatives regarding  endoscopy and colonoscopy have been reviewed with the patient.  Questions have been answered.  All parties agreeable.   [name], MD  02/15/2017, 11:04 AM

## 2017-02-15 NOTE — Progress Notes (Signed)
PD COMPLETE

## 2017-02-15 NOTE — Plan of Care (Signed)
Problem: Safety: Goal: Ability to remain free from injury will improve Outcome: Not Progressing Pt refuses bed alarm.

## 2017-02-15 NOTE — Discharge Summary (Signed)
[place] [facility] [place], 66 y.o., DOB 1950-12-21, MRN [id number]. Admission date: 02/10/2017 Discharge Date 02/15/2017 Primary MD Tracie Harrier, MD Admitting Physician Bettey Costa, MD  Admission Diagnosis  Acute blood loss anemia [D62] Gastrointestinal hemorrhage, unspecified gastrointestinal hemorrhage type [K92.2] GIB (gastrointestinal bleeding) [K92.2]  Discharge Diagnosis   Active Problems:  Acute GI bleed felt to be upper however endoscopy is negative Acute on chronic anemia requiring transfusion End-stage renal disease on peritoneal dialysis History of coronary artery disease with stent within the past few months Diabetes type 2 Essential hypertension Hypothyroidism       Hospital Course patient is a 66 year old female with known end-stage renal disease on parat[place] with dialysis who was recently admitted with GI bleed and was discharged home and presented back with dark color stools. Due to patient being on Plavix and her Plavix was discontinued. She underwent endoscopy and colonoscopy. No source of GI bleed was noted on endoscopy. Colonoscopy showed internal hemorrhoids but the prep was not great. GI recommends that she have outpatient capsule endoscopy. Patient is very anxious to go home currently stable hemoglobin. She needs her antiplatelet medications due to stent in February 2018 she unfortunately we cannot discontinue these. I will have her follow-up with her primary care provider in 5 days so she can have her CBC checked.            Consults  cardiology, gi  Significant Tests:  See full reports for all details    Dg Chest Port 1 View  Result Date: 02/11/2017 CLINICAL DATA:  Cough for 6 months, history CHF, coronary artery disease post stenting, diabetes mellitus, hypertension, former smoker EXAM: PORTABLE CHEST 1 VIEW COMPARISON:  Portable exam 1206 hours compared 03/23/2017 FINDINGS: Enlargement of cardiac  silhouette. Atherosclerotic calcification aorta with stent at a proximal great vessel new since previous study. Mediastinal contours and pulmonary vascularity normal. Improved pulmonary edema since previous exam. Minimal peribronchial thickening. No definite infiltrate, pleural effusion or pneumothorax. Bones demineralized. IMPRESSION: Enlargement of cardiac silhouette with improved pulmonary edema since prior study. No acute abnormalities. Aortic Atherosclerosis (ICD10-I70.0). Electronically Signed   By: Lavonia Dana M.D.   On: 02/11/2017 12:33       Today   Subjective:   Bethany Lee   Patient very anxious wants to go home    Blood pressure (!) 142/45, pulse 80, temperature 98.3 F (36.8 C), temperature source Oral, resp. rate 12, height 5' (1.524 m), weight 156 lb (70.8 kg), SpO2 100 %.  .  Intake/Output Summary (Last 24 hours) at 02/15/17 1332 Last data filed at 02/15/17 1142  Gross per 24 hour  Intake              506 ml  Output              646 ml  Net             -140 ml    Exam VITAL SIGNS: Blood pressure (!) 142/45, pulse 80, temperature 98.3 F (36.8 C), temperature source Oral, resp. rate 12, height 5' (1.524 m), weight 156 lb (70.8 kg), SpO2 100 %.  GENERAL:  66 y.o.-year-old patient lying in the bed with no acute distress.  EYES: Pupils equal, round, reactive to light and accommodation. No scleral icterus. Extraocular muscles intact.  HEENT: Head atraumatic, normocephalic. Oropharynx and nasopharynx clear.  NECK:  Supple, no jugular venous distention. No thyroid enlargement, no tenderness.  LUNGS: Normal breath sounds bilaterally, no  wheezing, rales,rhonchi or crepitation. No use of accessory muscles of respiration.  CARDIOVASCULAR: S1, S2 normal. No murmurs, rubs, or gallops.  ABDOMEN: Soft, nontender, nondistended. Bowel sounds present. No organomegaly or mass.  EXTREMITIES: No pedal edema, cyanosis, or clubbing.  NEUROLOGIC: Cranial nerves II through XII are intact.  Muscle strength 5/5 in all extremities. Sensation intact. Gait not checked.  PSYCHIATRIC: The patient is alert and oriented x 3.  SKIN: No obvious rash, lesion, or ulcer.   Data Review     CBC w Diff: Lab Results  Component Value Date   WBC 6.1 02/14/2017   HGB 7.8 (L) 02/15/2017   HCT 23.0 (L) 02/15/2017   PLT 111 (L) 02/14/2017   LYMPHOPCT 19 11/27/2016   MONOPCT 6 11/27/2016   EOSPCT 3 11/27/2016   BASOPCT 1 11/27/2016   CMP: Lab Results  Component Value Date   NA 131 (L) 02/15/2017   K 4.8 02/15/2017   K 4.8 01/06/2016   CL 101 02/11/2017   CO2 22 02/11/2017   BUN 119 (H) 02/11/2017   CREATININE 13.50 (H) 02/11/2017   PROT 5.0 (L) 02/10/2017   ALBUMIN 2.5 (L) 02/10/2017   BILITOT 0.7 02/10/2017   ALKPHOS 32 (L) 02/10/2017   AST 12 (L) 02/10/2017   ALT 11 (L) 02/10/2017  .  Micro Results Recent Results (from the past 240 hour(s))  Surgical pcr screen     Status: None   Collection Time: 02/06/17  2:52 PM  Result Value Ref Range Status   MRSA, PCR NEGATIVE NEGATIVE Final   Staphylococcus aureus NEGATIVE NEGATIVE Final    Comment:        The Xpert SA Assay (FDA approved for NASAL specimens in patients over 68 years of age), is one component of a comprehensive surveillance program.  Test performance has been validated by [facility] for patients greater than or equal to 68 year old. It is not intended to diagnose infection nor to guide or monitor treatment.   MRSA PCR Screening     Status: None   Collection Time: 02/10/17  8:20 PM  Result Value Ref Range Status   MRSA by PCR NEGATIVE NEGATIVE Final    Comment:        The GeneXpert MRSA Assay (FDA approved for NASAL specimens only), is one component of a comprehensive MRSA colonization surveillance program. It is not intended to diagnose MRSA infection nor to guide or monitor treatment for MRSA infections.         Code Status Orders        Start     Ordered   02/10/17 2014  Full code   Continuous     02/10/17 2014    Code Status History    Date Active Date Inactive Code Status Order ID Comments User Context   01/29/2017  9:49 PM 01/31/2017  4:12 PM Full Code [id number]  [place], Ubaldo Glassing, DO Inpatient   01/19/2017 11:40 AM 01/19/2017  6:43 PM Full Code [id number]  Schnier, Dolores Lory, MD Inpatient   09/21/2016 11:35 PM 09/23/2016  8:10 PM Full Code [id number]  Holley Raring, NP ED   09/07/2016 12:41 PM 09/08/2016  2:24 PM Full Code [id number]  Isaias Cowman, MD Inpatient   07/16/2016 12:15 PM 07/20/2016  2:45 PM Full Code [id number]  Wilhelmina Mcardle, MD ED   12/19/2015  2:16 PM 12/21/2015  8:57 PM Full Code [id number]  Fritzi Mandes, MD Inpatient          Follow-up Information  Tracie Harrier, MD Follow up in 5 day(s).   Specialty:  Internal Medicine Why:  check cbc Contact information: [place] [address] [phone]        [name], MD Follow up in 1 week(s).   Specialty:  Surgery Why:  capsule endoscopy Contact information: [place] [address] [phone]           Discharge Medications   Allergies as of 02/15/2017      Reactions   Penicillins    Childhood loss of consciousness  Has patient had a PCN reaction causing immediate rash, facial/tongue/throat swelling, SOB or lightheadedness with hypotension: Yes Has patient had a PCN reaction causing severe rash involving mucus membranes or skin necrosis: No Has patient had a PCN reaction that required hospitalization Unknown Has patient had a PCN reaction occurring within the last 10 years: Unknown If all of the above answers are "NO", then may proceed with Cephalosporin use.   Valacyclovir Nausea Only      Medication List    TAKE these medications   albuterol 108 (90 Base) MCG/ACT inhaler Commonly known as:  PROVENTIL HFA;VENTOLIN HFA Inhale 2 puffs into the lungs every 6 (six) hours as needed for wheezing or  shortness of breath.   amLODipine 5 MG tablet Commonly known as:  NORVASC Take 5 mg by mouth daily.   aspirin EC 81 MG tablet Take 1 tablet (81 mg total) by mouth daily. Start taking after NOV 15 What changed:  additional instructions   calcium acetate 667 MG capsule Commonly known as:  PHOSLO Take 2,001 mg by mouth 3 (three) times daily with meals.   carvedilol 12.5 MG tablet Commonly known as:  COREG Take 1 tablet (12.5 mg total) by mouth 2 (two) times daily with a meal. What changed:  how much to take   clopidogrel 75 MG tablet Commonly known as:  PLAVIX Take 1 tablet (75 mg total) by mouth daily with breakfast.   DIALYVITE 800/ULTRA D Tabs Take 1 tablet by mouth daily.   furosemide 80 MG tablet Commonly known as:  LASIX Take 80 mg by mouth 2 (two) times daily.   gentamicin cream 0.1 % Commonly known as:  GARAMYCIN Apply 1 application topically daily. What changed:  additional instructions   hydrALAZINE 50 MG tablet Commonly known as:  APRESOLINE Take 50 mg by mouth 2 (two) times daily.   Insulin Glargine 300 UNIT/ML Sopn Commonly known as:  TOUJEO SOLOSTAR Inject 20 Units into the skin at bedtime. What changed:  how much to take   levothyroxine 75 MCG tablet Commonly known as:  SYNTHROID, LEVOTHROID Take 75 mcg by mouth daily before breakfast.   lovastatin 40 MG tablet Commonly known as:  MEVACOR Take 40 mg by mouth at bedtime.   metolazone 5 MG tablet Commonly known as:  ZAROXOLYN Take 5 mg by mouth daily.   NOVOLOG FLEXPEN 100 UNIT/ML FlexPen Generic drug:  insulin aspart Inject 4-8 Units into the skin 3 (three) times daily with meals. Sliding scale below 200 take 4 units, 200-250=6 units, >250= 8 units   pantoprazole 40 MG tablet Commonly known as:  PROTONIX Take 1 tablet (40 mg total) by mouth 2 (two) times daily.   traMADol 50 MG tablet Commonly known as:  ULTRAM Take 1 tablet (50 mg total) by mouth every 8 (eight) hours as needed for  moderate pain or severe pain.   vitamin B-12 1000 MCG tablet Commonly  known as:  CYANOCOBALAMIN Take 1,000 mcg by mouth daily.   VITAMIN B6 PO Take by mouth.          Total Time in preparing paper work, data evaluation and todays exam - 35 minutes  Dustin Flock M.D on 02/15/2017 at 1:32 PM  [facility]   Office  [phone]

## 2017-02-15 NOTE — OR Nursing (Signed)
Venipuncture done in right hand for potassium to be done in SDS fast pass.

## 2017-02-15 NOTE — Progress Notes (Signed)
PD start, pt a&ox3, no c/o, refused dressing change.

## 2017-02-15 NOTE — Progress Notes (Signed)
[place], [place] 02/15/17  Subjective:   Peritoneal dialysis last night. Tolerated treatment well. UF of   No more melanotic stools  Objective:  Vital signs in last 24 hours:  Temp:  [97.5 F (36.4 C)-98.2 F (36.8 C)] 98.2 F (36.8 C) (07/26 0918) Pulse Rate:  [68-78] 74 (07/26 0918) Resp:  [12-19] 16 (07/26 0918) BP: (121-161)/(42-75) 161/51 (07/26 0918) SpO2:  [97 %-100 %] 97 % (07/26 0918) Weight:  [70.8 kg (156 lb)-70.9 kg (156 lb 6.4 oz)] 70.8 kg (156 lb) (07/26 0918)  Weight change: 2.743 kg (6 lb 0.7 oz) Filed Weights   02/14/17 0500 02/15/17 0452 02/15/17 0918  Weight: 70.8 kg (156 lb 1.4 oz) 70.9 kg (156 lb 6.4 oz) 70.8 kg (156 lb)    Intake/Output:    Intake/Output Summary (Last 24 hours) at 02/15/17 1021 Last data filed at 02/14/17 1850  Gross per 24 hour  Intake             1056 ml  Output                0 ml  Net             1056 ml     Physical Exam: General: No acute distress, Laying in bed   HEENT Anicteric, moist oral mucous membranes  Neck supple  Pulm/lungs Clear to auscultation, Normal effort   CVS/Heart regular  Abdomen:  Soft, nontender  Extremities: No edema  Neurologic: Alert, oriented, and able to answer questions  Skin: No acute rashes  Access: PD catheter, left upper extremity developing AV fistula       Basic Metabolic Panel:   Recent Labs Lab 02/10/17 1613 02/11/17 0929  NA 135 137  K 5.4* 4.7  CL 96* 101  CO2 24 22  GLUCOSE 185* 121*  BUN 115* 119*  CREATININE 14.01* 13.50*  CALCIUM 7.3* 7.2*     CBC:  Recent Labs Lab 02/10/17 1613  02/11/17 0929 02/12/17 1210 02/13/17 0446 02/13/17 1657 02/14/17 1018 02/15/17 0357  WBC 7.6  --  9.1 8.7 7.2  --  6.1  --   HGB 4.6*  < > 10.0* 8.1* 7.5* 8.7* 7.9* 9.3*  HCT 13.4*  < > 28.6* 23.4* 21.5* 25.5* 23.3*  --   MCV 92.8  --  84.6 85.8 86.5  --  84.7  --   PLT 129*  --  110* 102* 118*  --  111*  --   < > = values in this interval  not displayed.    Lab Results  Component Value Date   HEPBSAG Negative 01/30/2017   HEPBSAB Reactive 01/30/2017   HEPBIGM Negative 12/19/2015      Microbiology:  Recent Results (from the past 240 hour(s))  Surgical pcr screen     Status: None   Collection Time: 02/06/17  2:52 PM  Result Value Ref Range Status   MRSA, PCR NEGATIVE NEGATIVE Final   Staphylococcus aureus NEGATIVE NEGATIVE Final    Comment:        The Xpert SA Assay (FDA approved for NASAL specimens in patients over 20 years of age), is one component of a comprehensive surveillance program.  Test performance has been validated by [facility] for patients greater than or equal to 108 year old. It is not intended to diagnose infection nor to guide or monitor treatment.   MRSA PCR Screening     Status: None   Collection Time: 02/10/17  8:20 PM  Result  Value Ref Range Status   MRSA by PCR NEGATIVE NEGATIVE Final    Comment:        The GeneXpert MRSA Assay (FDA approved for NASAL specimens only), is one component of a comprehensive MRSA colonization surveillance program. It is not intended to diagnose MRSA infection nor to guide or monitor treatment for MRSA infections.     Coagulation Studies: No results for input(s): LABPROT, INR in the last 72 hours.  Urinalysis: No results for input(s): COLORURINE, LABSPEC, PHURINE, GLUCOSEU, HGBUR, BILIRUBINUR, KETONESUR, PROTEINUR, UROBILINOGEN, NITRITE, LEUKOCYTESUR in the last 72 hours.  Invalid input(s): APPERANCEUR    Imaging: No results found.   Medications:       Assessment/ Plan:  66 y.o. [place] female with end-stage renal disease on peritoneal dialysis, congestive heart failure, coronary disease, diabetes, hypertension, hyperlipidemia, hypothyroidism.  Dialysis: [facility] [address]: [facility] CCPD 5 x 2.25Litres ; tidal 85%  1.  ESRD on PD. inadequate dialysis as outpatient. Tolerated treatment last night.  - Increased fill volumes to 2.31mL.   - Continue 1.5% dextrose solution.  2. Anemia of CKD and blood loss: status post 5 units of PRBC transfusion this admission - Appreciate GI input: endoscopy for today - holding clopidogrel. Monitor platelet count.   3. Secondary hyperparathyroidism with hypocalcemia. Phosphorus at goal - calcium acetate  4. Hypertension.  - Continue amlodipine, carvedilol, and hydralazine.    LOS: 5 Lavonia Dana 7/26/201810:21 [place], [place]

## 2017-02-15 NOTE — Anesthesia Post-op Follow-up Note (Cosign Needed)
Anesthesia QCDR form completed.        

## 2017-02-15 NOTE — Consult Note (Signed)
[place]  [phone]) [phone] [phone]) [phone]  ID: [place] Lee OB: 1951-05-14  MR#: [id number]  AOZ#:[id number]  Patient Care Team: Tracie Harrier, MD as PCP - General (Internal Medicine)  CHIEF COMPLAINT: Iron deficiency anemia, possibly from a GI source. End-stage renal disease.  INTERVAL HISTORY: Patient is a 66 year old female who was last evaluated in clinic in March 2017. She was recently admitted to hospital and found to have profound anemia. She is required multiple units of packed red blood cells. Colonoscopy and EGD did not reveal definitive source. She continues with peritoneal dialysis, but has had an AV fistula placed. She feels improved since admission, but not back to her baseline. She has no neurologic complaints. She has increased weakness and fatigue. She denies any recent fevers or illnesses. She has a fair appetite, but denies weight loss. She has no chest pain or shortness of breath. She denies any nausea, vomiting, constipation, or diarrhea. Any melena or hematochezia. She has no urinary complaints. Patient offers no further specific complaints today.  REVIEW OF SYSTEMS:   Review of Systems  Constitutional: Positive for malaise/fatigue. Negative for fever and weight loss.  Respiratory: Negative.  Negative for cough and shortness of breath.   Cardiovascular: Negative.  Negative for chest pain.  Gastrointestinal: Positive for blood in stool. Negative for abdominal pain, constipation, diarrhea, melena, nausea and vomiting.  Genitourinary: Negative.   Musculoskeletal: Negative.   Skin: Negative.  Negative for rash.  Neurological: Positive for weakness.  Psychiatric/Behavioral: The patient is nervous/anxious.     As per HPI. Otherwise, a complete review of systems is negative.  PAST MEDICAL HISTORY: Past Medical History:  Diagnosis Date  . Anemia   . CHF (congestive heart failure) ([place])   . Chronic kidney disease    peritoneal  dialysis  . Complication of anesthesia   . Coronary artery disease   . Diabetes mellitus without complication ([place])   . Dialysis patient [facility])    Peritoneal dialysis patient  . Dyspnea    doe  . Edema   . History of recent blood transfusion 01/2017  . Hypercholesterolemia   . Hypertension   . Hypothyroidism   . Neuropathy   . PONV (postoperative nausea and vomiting)   . Shingles    October-November 2017  . Tremors of nervous system    intermittent when taking gabapentin    PAST SURGICAL HISTORY: Past Surgical History:  Procedure Laterality Date  . A/V SHUNT INTERVENTION N/A 01/16/2017   Procedure: A/V Shunt Intervention;  Surgeon: Katha Cabal, MD;  Location: [place] [facility];  Service: Cardiovascular;  Laterality: N/A;  . A/V SHUNTOGRAM Left 01/16/2017   Procedure: A/V Fistulagram;  Surgeon: Katha Cabal, MD;  Location: [place] [facility];  Service: Cardiovascular;  Laterality: Left;  . ABDOMINAL HYSTERECTOMY    . AV FISTULA PLACEMENT Left 11/30/2016   Procedure: ARTERIOVENOUS (AV) FISTULA CREATION ( EXPLORE FOR CREATION BRACHIOCEPHALIC);  Surgeon: Algernon Huxley, MD;  Location: [facility];  Service: Vascular;  Laterality: Left;  . CAPD INSERTION N/A 11/25/2015   Procedure: LAPAROSCOPIC INSERTION CONTINUOUS AMBULATORY PERITONEAL DIALYSIS  (CAPD) CATHETER;  Surgeon: Algernon Huxley, MD;  Location: [facility];  Service: Vascular;  Laterality: N/A;  . CAPD INSERTION N/A 01/06/2016   Procedure: LAPAROSCOPIC INSERTION CONTINUOUS AMBULATORY PERITONEAL DIALYSIS  (CAPD) CATHETER REVISION ;  Surgeon: Algernon Huxley, MD;  Location: [facility];  Service: Vascular;  Laterality: N/A;  . CAPD INSERTION N/A 10/11/2016   Procedure: LAPAROSCOPIC INSERTION  CONTINUOUS AMBULATORY PERITONEAL DIALYSIS  (CAPD) CATHETER ( REVISION );  Surgeon: Algernon Huxley, MD;  Location: [facility];  Service: Vascular;  Laterality: N/A;  . CARDIAC CATHETERIZATION    . CATARACT EXTRACTION W/PHACO Right 05/25/2016    Procedure: CATARACT EXTRACTION PHACO AND INTRAOCULAR LENS PLACEMENT (IOC);  Surgeon: Eulogio Bear, MD;  Location: [facility];  Service: Ophthalmology;  Laterality: Right;  Lot # [id number] H [place]: 00:5.2 AP%: 11.3 CDE: 7.21  . CORONARY ANGIOPLASTY    . CORONARY STENT INTERVENTION N/A 09/07/2016   Procedure: Coronary Stent Intervention;  Surgeon: Isaias Cowman, MD;  Location: [place] [facility];  Service: Cardiovascular;  Laterality: N/A;  . DIALYSIS/PERMA CATHETER INSERTION N/A 09/21/2016   Procedure: Dialysis/Perma Catheter Insertion;  Surgeon: Algernon Huxley, MD;  Location: [place] [facility];  Service: Cardiovascular;  Laterality: N/A;  . DIALYSIS/PERMA CATHETER REMOVAL Right 11/30/2016   Procedure: DIALYSIS/PERMA CATHETER REMOVAL;  Surgeon: Algernon Huxley, MD;  Location: [facility];  Service: Vascular;  Laterality: Right;  . EYE SURGERY    . LEFT HEART CATH AND CORONARY ANGIOGRAPHY Left 09/07/2016   Procedure: Left Heart Cath and Coronary Angiography;  Surgeon: Isaias Cowman, MD;  Location: [place] [facility];  Service: Cardiovascular;  Laterality: Left;  . LIGATION OF ARTERIOVENOUS  FISTULA Left 02/07/2017   Procedure: LIGATION OF ARTERIOVENOUS  FISTULA ( BANDING BRACHIAL CEPHALIC );  Surgeon: Katha Cabal, MD;  Location: [facility];  Service: Vascular;  Laterality: Left;  . TUBAL LIGATION    . UPPER EXTREMITY ANGIOGRAPHY Left 01/19/2017   Procedure: Upper Extremity Angiography;  Surgeon: Katha Cabal, MD;  Location: [place] [facility];  Service: Cardiovascular;  Laterality: Left;    FAMILY HISTORY: History reviewed. No pertinent family history.  ADVANCED DIRECTIVES (Y/N):  @ADVDIR @  HEALTH MAINTENANCE: Social History  Substance Use Topics  . Smoking status: Former Smoker    Quit date: 10/09/2008  . Smokeless tobacco: Never Used  . Alcohol use No     Colonoscopy:  PAP:  Bone density:  Lipid panel:  Allergies  Allergen Reactions  . Penicillins     Childhood  loss of consciousness  Has patient had a PCN reaction causing immediate rash, facial/tongue/throat swelling, SOB or lightheadedness with hypotension: Yes Has patient had a PCN reaction causing severe rash involving mucus membranes or skin necrosis: No Has patient had a PCN reaction that required hospitalization Unknown Has patient had a PCN reaction occurring within the last 10 years: Unknown If all of the above answers are "NO", then may proceed with Cephalosporin use.   . Valacyclovir Nausea Only    Current Facility-Administered Medications  Medication Dose Route Frequency Provider Last Rate Last Dose  . 0.9 %  sodium chloride infusion   Intravenous Continuous [name], MD 10 mL/hr at 02/15/17 0000 10 mL at 02/15/17 0000  . acetaminophen (TYLENOL) tablet 650 mg  650 mg Oral Q6H PRN Bettey Costa, MD       Or  . acetaminophen (TYLENOL) suppository 650 mg  650 mg Rectal Q6H PRN Mody, Sital, MD      . amLODipine (NORVASC) tablet 5 mg  5 mg Oral Daily Mody, Sital, MD   5 mg at 02/14/17 1103  . butamben-tetracaine-benzocaine (CETACAINE) 08-25-12 % spray           . calcium acetate (PHOSLO) capsule 2,001 mg  2,001 mg Oral TID WC Bettey Costa, MD   2,001 mg at 02/15/17 1907  . carvedilol (COREG) tablet 25 mg  25 mg Oral BID WC Bettey Costa, MD   25 mg at 02/15/17 1858  . dialysis solution 1.5% low-MG/low-CA dianeal solution   Intraperitoneal Q24H Lateef, Munsoor, MD      . furosemide (LASIX) tablet 80 mg  80 mg Oral BID Bettey Costa, MD   80 mg at 02/15/17 1902  . gentamicin cream (GARAMYCIN) 0.1 % 1 application  1 application Topical Daily Lateef, Munsoor, MD   1 application at 23/55/73 0756  . heparin 1000 unit/ml injection 500 Units  500 Units Intraperitoneal PRN Lateef, Munsoor, MD      . hydrALAZINE (APRESOLINE) tablet 50 mg  50 mg Oral BID Bettey Costa, MD   50 mg at 02/15/17 2137  . HYDROcodone-acetaminophen (NORCO/VICODIN) 5-325 MG per tablet 1-2 tablet  1-2 tablet Oral Q4H PRN Bettey Costa, MD    2 tablet at 02/13/17 1253  . insulin aspart (novoLOG) injection 0-9 Units  0-9 Units Subcutaneous TID WC Fritzi Mandes, MD   1 Units at 02/14/17 1226  . levothyroxine (SYNTHROID, LEVOTHROID) tablet 75 mcg  75 mcg Oral QAC breakfast Bettey Costa, MD   75 mcg at 02/14/17 0828  . MEDLINE mouth rinse  15 mL Mouth Rinse BID Varughese, Bincy S, NP   15 mL at 02/14/17 2341  . multivitamin with minerals tablet 1 tablet  1 tablet Oral Daily Bettey Costa, MD   1 tablet at 02/14/17 1103  . ondansetron (ZOFRAN) injection 4 mg  4 mg Intravenous Q6H PRN Fritzi Mandes, MD      . pantoprazole (PROTONIX) injection 40 mg  40 mg Intravenous Q12H Bettey Costa, MD   40 mg at 02/15/17 2137  . senna-docusate (Senokot-S) tablet 1 tablet  1 tablet Oral QHS PRN Bettey Costa, MD      . vitamin B-12 (CYANOCOBALAMIN) tablet 1,000 mcg  1,000 mcg Oral Daily Mody, Sital, MD   1,000 mcg at 02/14/17 1103    OBJECTIVE: Vitals:   02/15/17 1251 02/15/17 2100  BP: (!) 142/45 (!) 144/54  Pulse: 80 76  Resp: 12 18  Temp: 98.3 F (36.8 C) 98.2 F (36.8 C)     Body mass index is 30.47 kg/m.    ECOG FS:1 - Symptomatic but completely ambulatory  General: Well-developed, well-nourished, no acute distress. Eyes: Pink conjunctiva, anicteric sclera. HEENT: Normocephalic, moist mucous membranes, clear oropharnyx. Lungs: Clear to auscultation bilaterally. Heart: Regular rate and rhythm. No rubs, murmurs, or gallops. Abdomen: Soft, nontender, nondistended. No organomegaly noted, normoactive bowel sounds. Musculoskeletal: No edema, cyanosis, or clubbing. Neuro: Alert, answering all questions appropriately. Cranial nerves grossly intact. Skin: No rashes or petechiae noted. AV fistula noted in left arm. Psych: Normal affect. Lymphatics: No cervical, calvicular, axillary or inguinal LAD.   LAB RESULTS:  Lab Results  Component Value Date   NA 131 (L) 02/15/2017   K 4.8 02/15/2017   CL 101 02/11/2017   CO2 22 02/11/2017   GLUCOSE 82  02/15/2017   BUN 119 (H) 02/11/2017   CREATININE 13.50 (H) 02/11/2017   CALCIUM 7.2 (L) 02/11/2017   PROT 5.0 (L) 02/10/2017   ALBUMIN 2.5 (L) 02/10/2017   AST 12 (L) 02/10/2017   ALT 11 (L) 02/10/2017   ALKPHOS 32 (L) 02/10/2017   BILITOT 0.7 02/10/2017   GFRNONAA 2 (L) 02/11/2017   GFRAA 3 (L) 02/11/2017    Lab Results  Component Value Date   WBC 6.1 02/14/2017   NEUTROABS 5.8 11/27/2016   HGB 7.8 (L) 02/15/2017   HCT 23.0 (L) 02/15/2017  MCV 84.7 02/14/2017   PLT 111 (L) 02/14/2017     STUDIES: Dg Chest Port 1 View  Result Date: 02/11/2017 CLINICAL DATA:  Cough for 6 months, history CHF, coronary artery disease post stenting, diabetes mellitus, hypertension, former smoker EXAM: PORTABLE CHEST 1 VIEW COMPARISON:  Portable exam 1206 hours compared 03/23/2017 FINDINGS: Enlargement of cardiac silhouette. Atherosclerotic calcification aorta with stent at a proximal great vessel new since previous study. Mediastinal contours and pulmonary vascularity normal. Improved pulmonary edema since previous exam. Minimal peribronchial thickening. No definite infiltrate, pleural effusion or pneumothorax. Bones demineralized. IMPRESSION: Enlargement of cardiac silhouette with improved pulmonary edema since prior study. No acute abnormalities. Aortic Atherosclerosis (ICD10-I70.0). Electronically Signed   By: Lavonia Dana M.D.   On: 02/11/2017 12:33    ASSESSMENT:  Iron deficiency anemia, possibly from a GI source. End-stage renal disease.  PLAN:    1. Anemia: Likely multifactorial given patient's end stage renal disease and bleed from possibly from a GI source. Colonoscopy and EGD were unrevealing and there is a plan to do a capsule endoscopy as an outpatient. Her most recent hemoglobin has improved to 9.3 with multiple units of packed red blood cells. Her reticulocyte count is appropriately elevated. LDH is mildly elevated, and haptoglobin is pending to assess for underlying hemolysis. Her  ferritin is elevated which is likely an acute phase reactant. Iron stores are essentially within normal limits. Continue Procrit as prescribed. Please insure patient has follow-up in the [place] in 3-4 weeks to ensure her hemoglobin does not begin to trend back down. 2. GI bleed: Colonoscopy and EGD unrevealing. Capsule endoscopy as an outpatient as above. 3. End-stage renal disease: Continue dialysis.  Appreciate consult, call with questions.   Lloyd Huger, MD   02/15/2017 9:49 PM

## 2017-02-15 NOTE — Progress Notes (Signed)
PD complete. Pt alert, c/o of CBG of 86 and requested to speak with Kolluru MD. Juleen China MD aware. PD UF 678ml.

## 2017-02-15 NOTE — Anesthesia Procedure Notes (Signed)
Performed by: COOK-MARTIN, Meleena Munroe Pre-anesthesia Checklist: Patient identified, Emergency Drugs available, Suction available, Patient being monitored and Timeout performed Patient Re-evaluated:Patient Re-evaluated prior to induction Oxygen Delivery Method: Nasal cannula Preoxygenation: Pre-oxygenation with 100% oxygen Induction Type: IV induction Airway Equipment and Method: Bite block Placement Confirmation: positive ETCO2 and CO2 detector       

## 2017-02-15 NOTE — Progress Notes (Signed)
PD START

## 2017-02-16 ENCOUNTER — Encounter: Payer: Self-pay | Admitting: Gastroenterology

## 2017-02-16 LAB — CBC
HEMATOCRIT: 27.7 % — AB (ref 35.0–47.0)
Hemoglobin: 9.5 g/dL — ABNORMAL LOW (ref 12.0–16.0)
MCH: 29.2 pg (ref 26.0–34.0)
MCHC: 34.1 g/dL (ref 32.0–36.0)
MCV: 85.5 fL (ref 80.0–100.0)
Platelets: 131 10*3/uL — ABNORMAL LOW (ref 150–440)
RBC: 3.24 MIL/uL — ABNORMAL LOW (ref 3.80–5.20)
RDW: 16.9 % — AB (ref 11.5–14.5)
WBC: 6.1 10*3/uL (ref 3.6–11.0)

## 2017-02-16 LAB — BASIC METABOLIC PANEL
Anion gap: 15 (ref 5–15)
BUN: 86 mg/dL — ABNORMAL HIGH (ref 6–20)
CALCIUM: 7.3 mg/dL — AB (ref 8.9–10.3)
CO2: 25 mmol/L (ref 22–32)
CREATININE: 14.22 mg/dL — AB (ref 0.44–1.00)
Chloride: 94 mmol/L — ABNORMAL LOW (ref 101–111)
GFR calc Af Amer: 3 mL/min — ABNORMAL LOW (ref >60)
GFR calc non Af Amer: 2 mL/min — ABNORMAL LOW (ref >60)
GLUCOSE: 124 mg/dL — AB (ref 65–99)
Potassium: 4.5 mmol/L (ref 3.5–5.1)
Sodium: 134 mmol/L — ABNORMAL LOW (ref 135–145)

## 2017-02-16 LAB — SURGICAL PATHOLOGY

## 2017-02-16 LAB — GLUCOSE, CAPILLARY: Glucose-Capillary: 122 mg/dL — ABNORMAL HIGH (ref 65–99)

## 2017-02-16 LAB — HAPTOGLOBIN: Haptoglobin: 78 mg/dL (ref 34–200)

## 2017-02-16 NOTE — Progress Notes (Signed)
[place], [place] 02/16/17  Subjective:   Peritoneal dialysis last night. Tolerated treatment well.   Endoscopy yesterday. Nosource of bleeding found.   Objective:  Vital signs in last 24 hours:  Temp:  [98.2 F (36.8 C)-98.3 F (36.8 C)] 98.3 F (36.8 C) (07/27 1025) Pulse Rate:  [73-80] 74 (07/27 0859) Resp:  [12-20] 20 (07/27 0614) BP: (140-154)/(45-54) 140/50 (07/27 0859) SpO2:  [100 %] 100 % (07/27 0614) Weight:  [69.1 kg (152 lb 6.4 oz)] 69.1 kg (152 lb 6.4 oz) (07/27 0500)  Weight change: -0.181 kg (-6.4 oz) Filed Weights   02/15/17 0452 02/15/17 0918 02/16/17 0500  Weight: 70.9 kg (156 lb 6.4 oz) 70.8 kg (156 lb) 69.1 kg (152 lb 6.4 oz)    Intake/Output:    Intake/Output Summary (Last 24 hours) at 02/16/17 1250 Last data filed at 02/16/17 0913  Gross per 24 hour  Intake              223 ml  Output              628 ml  Net             -405 ml     Physical Exam: General: No acute distress, Laying in bed   HEENT Anicteric, moist oral mucous membranes  Neck supple  Pulm/lungs Clear to auscultation, Normal effort   CVS/Heart regular  Abdomen:  Soft, nontender  Extremities: No edema  Neurologic: Alert, oriented, and able to answer questions  Skin: No acute rashes  Access: PD catheter, left upper extremity developing AV fistula       Basic Metabolic Panel:   Recent Labs Lab 02/10/17 1613 02/11/17 0929 02/15/17 0942 02/16/17 0440  NA 135 137 131* 134*  K 5.4* 4.7 4.8 4.5  CL 96* 101  --  94*  CO2 24 22  --  25  GLUCOSE 185* 121* 82 124*  BUN 115* 119*  --  86*  CREATININE 14.01* 13.50*  --  14.22*  CALCIUM 7.3* 7.2*  --  7.3*     CBC:  Recent Labs Lab 02/11/17 0929 02/12/17 1210 02/13/17 0446 02/13/17 1657 02/14/17 1018 02/15/17 0357 02/15/17 0942 02/16/17 0440  WBC 9.1 8.7 7.2  --  6.1  --   --  6.1  HGB 10.0* 8.1* 7.5* 8.7* 7.9* 9.3* 7.8* 9.5*  HCT 28.6* 23.4* 21.5* 25.5* 23.3*  --  23.0* 27.7*   MCV 84.6 85.8 86.5  --  84.7  --   --  85.5  PLT 110* 102* 118*  --  111*  --   --  131*      Lab Results  Component Value Date   HEPBSAG Negative 01/30/2017   HEPBSAB Reactive 01/30/2017   HEPBIGM Negative 12/19/2015      Microbiology:  Recent Results (from the past 240 hour(s))  Surgical pcr screen     Status: None   Collection Time: 02/06/17  2:52 PM  Result Value Ref Range Status   MRSA, PCR NEGATIVE NEGATIVE Final   Staphylococcus aureus NEGATIVE NEGATIVE Final    Comment:        The Xpert SA Assay (FDA approved for NASAL specimens in patients over 66 years of age), is one component of a comprehensive surveillance program.  Test performance has been validated by [facility] for patients greater than or equal to 73 year old. It is not intended to diagnose infection nor to guide or monitor treatment.   MRSA PCR Screening  Status: None   Collection Time: 02/10/17  8:20 PM  Result Value Ref Range Status   MRSA by PCR NEGATIVE NEGATIVE Final    Comment:        The GeneXpert MRSA Assay (FDA approved for NASAL specimens only), is one component of a comprehensive MRSA colonization surveillance program. It is not intended to diagnose MRSA infection nor to guide or monitor treatment for MRSA infections.     Coagulation Studies: No results for input(s): LABPROT, INR in the last 72 hours.  Urinalysis: No results for input(s): COLORURINE, LABSPEC, PHURINE, GLUCOSEU, HGBUR, BILIRUBINUR, KETONESUR, PROTEINUR, UROBILINOGEN, NITRITE, LEUKOCYTESUR in the last 72 hours.  Invalid input(s): APPERANCEUR    Imaging: No results found.   Medications:       Assessment/ Plan:  66 y.o. [place] female with end-stage renal disease on peritoneal dialysis, congestive heart failure, coronary disease, diabetes, hypertension, hyperlipidemia, hypothyroidism.  Dialysis: [facility] [address]: [facility] CCPD 5 x 2.25Litres ; tidal 85%  1.  ESRD on PD. inadequate dialysis as  outpatient. Tolerated treatment last night.  - Increased fill volumes to 2.13mL.  - Continue 1.5% dextrose solution.  2. Anemia of CKD and blood loss: status post 5 units of PRBC transfusion this admission - Appreciate GI input  - holding clopidogrel. Monitor platelet count.   3. Secondary hyperparathyroidism with hypocalcemia. Phosphorus at goal - calcium acetate  4. Hypertension.  - Continue amlodipine, carvedilol, and hydralazine.    LOS: 6 Lavonia Dana 7/27/201812:50 PM  [address] [place], [place]

## 2017-02-16 NOTE — Anesthesia Postprocedure Evaluation (Signed)
Anesthesia Post Note  Patient: Bethany Lee  Procedure(s) Performed: Procedure(s) (LRB): ESOPHAGOGASTRODUODENOSCOPY (EGD) WITH PROPOFOL (N/A) COLONOSCOPY WITH PROPOFOL (N/A)  Patient location during evaluation: PACU Anesthesia Type: General Level of consciousness: awake and alert and oriented Pain management: pain level controlled Vital Signs Assessment: post-procedure vital signs reviewed and stable Respiratory status: spontaneous breathing Cardiovascular status: blood pressure returned to baseline Anesthetic complications: no     Last Vitals:  Vitals:   02/16/17 0614 02/16/17 0859  BP: (!) 154/49 (!) 140/50  Pulse: 73 74  Resp: 20   Temp: 36.8 C     Last Pain:  Vitals:   02/16/17 0614  TempSrc: Oral  PainSc:                  Philippe Gang

## 2017-02-16 NOTE — Care Management Important Message (Signed)
Important Message  Patient Details  Name: Bethany Lee MRN: [id number] Date of Birth: [birth year]   Medicare Important Message Given:  Yes    Beverly Sessions, RN 02/16/2017, 10:36 AM

## 2017-02-16 NOTE — Progress Notes (Signed)
IV and tele were removed. Discharge instructions and follow-up appointments were provided to the pt and husband at bedside. The pt was taken downstairs via wheelchair by volunteer services.

## 2017-02-16 NOTE — Discharge Instructions (Addendum)
[place] [facility] [place]:  Renal diet  DISCHARGE CONDITION:  Stable  ACTIVITY:  Activity as tolerated  OXYGEN:  Home Oxygen: No.   Oxygen Delivery: room air  DISCHARGE LOCATION:  home    Check CBC on Monday at Dr. Ginette Pitman Office  ADDITIONAL DISCHARGE INSTRUCTION: hold plavix until seen by Dr.Parachos   If you experience worsening of your admission symptoms, develop shortness of breath, life threatening emergency, suicidal or homicidal thoughts you must seek medical attention immediately by calling 911 or calling your MD immediately  if symptoms less severe.  You Must read complete instructions/literature along with all the possible adverse reactions/side effects for all the Medicines you take and that have been prescribed to you. Take any new Medicines after you have completely understood and accpet all the possible adverse reactions/side effects.   Please note  You were cared for by a hospitalist during your hospital stay. If you have any questions about your discharge medications or the care you received while you were in the hospital after you are discharged, you can call the unit and asked to speak with the hospitalist on call if the hospitalist that took care of you is not available. Once you are discharged, your primary care physician will handle any further medical issues. Please note that NO REFILLS for any discharge medications will be authorized once you are discharged, as it is imperative that you return to your primary care physician (or establish a relationship with a primary care physician if you do not have one) for your aftercare needs so that they can reassess your need for medications and monitor your lab values.

## 2017-02-16 NOTE — Progress Notes (Signed)
PD COMPLETED

## 2017-02-17 DIAGNOSIS — Z992 Dependence on renal dialysis: Secondary | ICD-10-CM | POA: Diagnosis not present

## 2017-02-17 DIAGNOSIS — N186 End stage renal disease: Secondary | ICD-10-CM | POA: Diagnosis not present

## 2017-02-18 DIAGNOSIS — N186 End stage renal disease: Secondary | ICD-10-CM | POA: Diagnosis not present

## 2017-02-18 DIAGNOSIS — Z992 Dependence on renal dialysis: Secondary | ICD-10-CM | POA: Diagnosis not present

## 2017-02-18 NOTE — Progress Notes (Signed)
Patient ID: Bethany Lee, female   DOB: [birth year], 66 y.o.   MRN: [id number]  No chief complaint on file.   HPI  Radhika Dershem Charnay Nazario is a 66 y.o. female.    She is s/p revision of left arm brachial cephalic fistula with banding of the AV access for elimination of steal syndrome 02/07/2017. She notes that since this operation her hands stopped hurting her left fingers are moving well and she has normal motor function. She still has some numbness of the tips of the third fourth and fifth fingers  She was DC'd from the hospital on 02/15/2017 s/p treatment for a GI bleed with a presenting Hgb of 4.6. Since discharge she has been feeling fairly well.   Past Medical History:  Diagnosis Date  . Anemia   . CHF (congestive heart failure) ([place])   . Chronic kidney disease    peritoneal dialysis  . Complication of anesthesia   . Coronary artery disease   . Diabetes mellitus without complication ([place])   . Dialysis patient [facility])    Peritoneal dialysis patient  . Dyspnea    doe  . Edema   . History of recent blood transfusion 01/2017  . Hypercholesterolemia   . Hypertension   . Hypothyroidism   . Neuropathy   . PONV (postoperative nausea and vomiting)   . Shingles    October-November 2017  . Tremors of nervous system    intermittent when taking gabapentin    Past Surgical History:  Procedure Laterality Date  . A/V SHUNT INTERVENTION N/A 01/16/2017   Procedure: A/V Shunt Intervention;  Surgeon: Katha Cabal, MD;  Location: [place] CV LAB;  Service: Cardiovascular;  Laterality: N/A;  . A/V SHUNTOGRAM Left 01/16/2017   Procedure: A/V Fistulagram;  Surgeon: Katha Cabal, MD;  Location: [place] [facility];  Service: Cardiovascular;  Laterality: Left;  . ABDOMINAL HYSTERECTOMY    . AV FISTULA PLACEMENT Left 11/30/2016   Procedure: ARTERIOVENOUS (AV) FISTULA CREATION ( EXPLORE FOR CREATION BRACHIOCEPHALIC);  Surgeon: Algernon Huxley, MD;  Location: [facility] ORS;  Service:  Vascular;  Laterality: Left;  . CAPD INSERTION N/A 11/25/2015   Procedure: LAPAROSCOPIC INSERTION CONTINUOUS AMBULATORY PERITONEAL DIALYSIS  (CAPD) CATHETER;  Surgeon: Algernon Huxley, MD;  Location: [facility] ORS;  Service: Vascular;  Laterality: N/A;  . CAPD INSERTION N/A 01/06/2016   Procedure: LAPAROSCOPIC INSERTION CONTINUOUS AMBULATORY PERITONEAL DIALYSIS  (CAPD) CATHETER REVISION ;  Surgeon: Algernon Huxley, MD;  Location: [facility] ORS;  Service: Vascular;  Laterality: N/A;  . CAPD INSERTION N/A 10/11/2016   Procedure: LAPAROSCOPIC INSERTION CONTINUOUS AMBULATORY PERITONEAL DIALYSIS  (CAPD) CATHETER ( REVISION );  Surgeon: Algernon Huxley, MD;  Location: [facility] ORS;  Service: Vascular;  Laterality: N/A;  . CARDIAC CATHETERIZATION    . CATARACT EXTRACTION W/PHACO Right 05/25/2016   Procedure: CATARACT EXTRACTION PHACO AND INTRAOCULAR LENS PLACEMENT (IOC);  Surgeon: Eulogio Bear, MD;  Location: [facility] ORS;  Service: Ophthalmology;  Laterality: Right;  Lot # [id number] H [place]: 00:5.2 AP%: 11.3 CDE: 7.21  . COLONOSCOPY WITH PROPOFOL N/A 02/15/2017   Procedure: COLONOSCOPY WITH PROPOFOL;  Surgeon: [name], MD;  Location: [facility] ENDOSCOPY;  Service: Gastroenterology;  Laterality: N/A;  . CORONARY ANGIOPLASTY    . CORONARY STENT INTERVENTION N/A 09/07/2016   Procedure: Coronary Stent Intervention;  Surgeon: Isaias Cowman, MD;  Location: [place] [facility] LAB;  Service: Cardiovascular;  Laterality: N/A;  . DIALYSIS/PERMA CATHETER INSERTION N/A 09/21/2016   Procedure: Dialysis/Perma Catheter Insertion;  Surgeon: Algernon Huxley, MD;  Location: [place] CV LAB;  Service: Cardiovascular;  Laterality: N/A;  . DIALYSIS/PERMA CATHETER REMOVAL Right 11/30/2016   Procedure: DIALYSIS/PERMA CATHETER REMOVAL;  Surgeon: Algernon Huxley, MD;  Location: [facility] ORS;  Service: Vascular;  Laterality: Right;  . ESOPHAGOGASTRODUODENOSCOPY (EGD) WITH PROPOFOL N/A 02/15/2017   Procedure: ESOPHAGOGASTRODUODENOSCOPY (EGD) WITH PROPOFOL;  Surgeon:  [name], MD;  Location: [name] ENDOSCOPY;  Service: Gastroenterology;  Laterality: N/A;  . EYE SURGERY    . LEFT HEART CATH AND CORONARY ANGIOGRAPHY Left 09/07/2016   Procedure: Left Heart Cath and Coronary Angiography;  Surgeon: Isaias Cowman, MD;  Location: [place] CV LAB;  Service: Cardiovascular;  Laterality: Left;  . LIGATION OF ARTERIOVENOUS  FISTULA Left 02/07/2017   Procedure: LIGATION OF ARTERIOVENOUS  FISTULA ( BANDING BRACHIAL CEPHALIC );  Surgeon: Katha Cabal, MD;  Location: [facility] ORS;  Service: Vascular;  Laterality: Left;  . TUBAL LIGATION    . UPPER EXTREMITY ANGIOGRAPHY Left 01/19/2017   Procedure: Upper Extremity Angiography;  Surgeon: Katha Cabal, MD;  Location: [place] [facility] LAB;  Service: Cardiovascular;  Laterality: Left;      Allergies  Allergen Reactions  . Penicillins     Childhood loss of consciousness  Has patient had a PCN reaction causing immediate rash, facial/tongue/throat swelling, SOB or lightheadedness with hypotension: Yes Has patient had a PCN reaction causing severe rash involving mucus membranes or skin necrosis: No Has patient had a PCN reaction that required hospitalization Unknown Has patient had a PCN reaction occurring within the last 10 years: Unknown If all of the above answers are "NO", then may proceed with Cephalosporin use.   . Valacyclovir Nausea Only    Current Outpatient Prescriptions  Medication Sig Dispense Refill  . albuterol (PROVENTIL HFA;VENTOLIN HFA) 108 (90 Base) MCG/ACT inhaler Inhale 2 puffs into the lungs every 6 (six) hours as needed for wheezing or shortness of breath.    Marland Kitchen amLODipine (NORVASC) 5 MG tablet Take 5 mg by mouth daily.    Marland Kitchen aspirin EC 81 MG tablet Take 1 tablet (81 mg total) by mouth daily. Start taking after NOV 15 (Patient taking differently: Take 81 mg by mouth daily. ) 30 tablet 0  . calcium acetate (PHOSLO) 667 MG capsule Take 2,001 mg by mouth 3 (three) times daily with meals.     . carvedilol (COREG) 12.5 MG tablet Take 1 tablet (12.5 mg total) by mouth 2 (two) times daily with a meal. (Patient taking differently: Take 25 mg by mouth 2 (two) times daily with a meal. ) 60 tablet 0  . furosemide (LASIX) 80 MG tablet Take 80 mg by mouth 2 (two) times daily.    Marland Kitchen gentamicin cream (GARAMYCIN) 0.1 % Apply 1 application topically daily. (Patient taking differently: Apply 1 application topically daily. Catheter dressing changes) 15 g 0  . hydrALAZINE (APRESOLINE) 50 MG tablet Take 50 mg by mouth 2 (two) times daily.    . insulin aspart (NOVOLOG FLEXPEN) 100 UNIT/ML FlexPen Inject 4-8 Units into the skin 3 (three) times daily with meals. Sliding scale below 200 take 4 units, 200-250=6 units, >250= 8 units    . Insulin Glargine (TOUJEO SOLOSTAR) 300 UNIT/ML SOPN Inject 20 Units into the skin at bedtime. (Patient taking differently: Inject 24 Units into the skin at bedtime. ) 30 pen 5  . levothyroxine (SYNTHROID, LEVOTHROID) 75 MCG tablet Take 75 mcg by mouth daily before breakfast.    . lovastatin (MEVACOR) 40 MG tablet Take  40 mg by mouth at bedtime.    . metolazone (ZAROXOLYN) 5 MG tablet Take 5 mg by mouth daily.     . Multiple Vitamins-Minerals (DIALYVITE 800/ULTRA D) TABS Take 1 tablet by mouth daily.    . pantoprazole (PROTONIX) 40 MG tablet Take 1 tablet (40 mg total) by mouth 2 (two) times daily. 60 tablet 0  . Pyridoxine HCl (VITAMIN B6 PO) Take by mouth.    . traMADol (ULTRAM) 50 MG tablet Take 1 tablet (50 mg total) by mouth every 8 (eight) hours as needed for moderate pain or severe pain. 40 tablet 0  . vitamin B-12 (CYANOCOBALAMIN) 1000 MCG tablet Take 1,000 mcg by mouth daily.     No current facility-administered medications for this visit.         Physical Exam There were no vitals taken for this visit. Gen:  WD/WN, NAD Skin: incision C/D/I  2+ left radial pulse. 5 out of 5 hand strength left     Assessment/Plan:  1. Steal syndrome as complication of  dialysis access, sequela Now resolved status post banding  2. ESRD on dialysis [facility]) Continue peritoneal dialysis without interruption  3. Peritoneal dialysis status ([place]) Fistula be maintained as a backup access  4. Coronary artery disease involving native coronary artery of native heart with angina pectoris ([place])   5. Essential hypertension       Hortencia Pilar 02/18/2017, 4:55 PM   This note was created with Dragon medical transcription system.  Any errors from dictation are unintentional.

## 2017-02-19 ENCOUNTER — Encounter (INDEPENDENT_AMBULATORY_CARE_PROVIDER_SITE_OTHER): Payer: Self-pay | Admitting: Vascular Surgery

## 2017-02-19 ENCOUNTER — Ambulatory Visit (INDEPENDENT_AMBULATORY_CARE_PROVIDER_SITE_OTHER): Payer: Medicare HMO | Admitting: Vascular Surgery

## 2017-02-19 VITALS — BP 140/59 | HR 81 | Resp 14 | Ht 60.0 in | Wt 145.0 lb

## 2017-02-19 DIAGNOSIS — I10 Essential (primary) hypertension: Secondary | ICD-10-CM

## 2017-02-19 DIAGNOSIS — T82898S Other specified complication of vascular prosthetic devices, implants and grafts, sequela: Secondary | ICD-10-CM

## 2017-02-19 DIAGNOSIS — N186 End stage renal disease: Secondary | ICD-10-CM

## 2017-02-19 DIAGNOSIS — Z992 Dependence on renal dialysis: Secondary | ICD-10-CM | POA: Diagnosis not present

## 2017-02-19 DIAGNOSIS — I25119 Atherosclerotic heart disease of native coronary artery with unspecified angina pectoris: Secondary | ICD-10-CM

## 2017-02-20 DIAGNOSIS — N186 End stage renal disease: Secondary | ICD-10-CM | POA: Diagnosis not present

## 2017-02-20 DIAGNOSIS — Z992 Dependence on renal dialysis: Secondary | ICD-10-CM | POA: Diagnosis not present

## 2017-02-21 ENCOUNTER — Telehealth: Payer: Self-pay

## 2017-02-21 ENCOUNTER — Other Ambulatory Visit: Payer: Self-pay

## 2017-02-21 DIAGNOSIS — E1121 Type 2 diabetes mellitus with diabetic nephropathy: Secondary | ICD-10-CM | POA: Diagnosis not present

## 2017-02-21 DIAGNOSIS — K922 Gastrointestinal hemorrhage, unspecified: Secondary | ICD-10-CM

## 2017-02-21 DIAGNOSIS — E782 Mixed hyperlipidemia: Secondary | ICD-10-CM | POA: Insufficient documentation

## 2017-02-21 DIAGNOSIS — Z794 Long term (current) use of insulin: Secondary | ICD-10-CM | POA: Diagnosis not present

## 2017-02-21 DIAGNOSIS — N186 End stage renal disease: Secondary | ICD-10-CM | POA: Diagnosis not present

## 2017-02-21 DIAGNOSIS — D638 Anemia in other chronic diseases classified elsewhere: Secondary | ICD-10-CM | POA: Diagnosis not present

## 2017-02-21 DIAGNOSIS — I5041 Acute combined systolic (congestive) and diastolic (congestive) heart failure: Secondary | ICD-10-CM | POA: Diagnosis not present

## 2017-02-21 DIAGNOSIS — N185 Chronic kidney disease, stage 5: Secondary | ICD-10-CM | POA: Diagnosis not present

## 2017-02-21 DIAGNOSIS — G629 Polyneuropathy, unspecified: Secondary | ICD-10-CM | POA: Insufficient documentation

## 2017-02-21 DIAGNOSIS — Z992 Dependence on renal dialysis: Secondary | ICD-10-CM | POA: Diagnosis not present

## 2017-02-21 NOTE — Telephone Encounter (Signed)
Advised patient of results per Dr. Vicente Males.   Husband was upset because he thought the capsule study should immediately follow her hospital visit.   Patient was scheduled for OV with Dr. Marius Ditch for 8/13 based on the referral received.   Scheduled capsule study for 8/2. Gave spouse all directions including anti-gas drops, mag citrate, and Gatorade.  Patient to arrive @ 730am @ [place].

## 2017-02-21 NOTE — Telephone Encounter (Signed)
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 The prep was inadequate for screening purposes as it weas poor. She should discuss colon cancer screening with her PCP Dr Ginette Pitman in the future and if warranted can be referred back for a repeat colonoscopy. She is presently too sick to return for one

## 2017-02-22 ENCOUNTER — Encounter: Admission: RE | Payer: Self-pay | Source: Ambulatory Visit

## 2017-02-22 ENCOUNTER — Ambulatory Visit: Admission: RE | Admit: 2017-02-22 | Payer: Medicare HMO | Source: Ambulatory Visit | Admitting: Gastroenterology

## 2017-02-22 DIAGNOSIS — Z992 Dependence on renal dialysis: Secondary | ICD-10-CM | POA: Diagnosis not present

## 2017-02-22 DIAGNOSIS — N186 End stage renal disease: Secondary | ICD-10-CM | POA: Diagnosis not present

## 2017-02-22 SURGERY — IMAGING PROCEDURE, GI TRACT, INTRALUMINAL, VIA CAPSULE

## 2017-02-23 ENCOUNTER — Other Ambulatory Visit: Payer: Self-pay

## 2017-02-23 DIAGNOSIS — Z955 Presence of coronary angioplasty implant and graft: Secondary | ICD-10-CM | POA: Diagnosis not present

## 2017-02-23 DIAGNOSIS — T82898D Other specified complication of vascular prosthetic devices, implants and grafts, subsequent encounter: Secondary | ICD-10-CM | POA: Diagnosis not present

## 2017-02-23 DIAGNOSIS — K922 Gastrointestinal hemorrhage, unspecified: Secondary | ICD-10-CM | POA: Diagnosis not present

## 2017-02-23 DIAGNOSIS — N185 Chronic kidney disease, stage 5: Secondary | ICD-10-CM | POA: Diagnosis not present

## 2017-02-23 DIAGNOSIS — N186 End stage renal disease: Secondary | ICD-10-CM | POA: Diagnosis not present

## 2017-02-23 DIAGNOSIS — I771 Stricture of artery: Secondary | ICD-10-CM | POA: Diagnosis not present

## 2017-02-23 DIAGNOSIS — I5041 Acute combined systolic (congestive) and diastolic (congestive) heart failure: Secondary | ICD-10-CM | POA: Diagnosis not present

## 2017-02-23 DIAGNOSIS — E1142 Type 2 diabetes mellitus with diabetic polyneuropathy: Secondary | ICD-10-CM | POA: Diagnosis not present

## 2017-02-23 DIAGNOSIS — I2510 Atherosclerotic heart disease of native coronary artery without angina pectoris: Secondary | ICD-10-CM | POA: Diagnosis not present

## 2017-02-23 DIAGNOSIS — Z992 Dependence on renal dialysis: Secondary | ICD-10-CM | POA: Diagnosis not present

## 2017-02-23 DIAGNOSIS — I10 Essential (primary) hypertension: Secondary | ICD-10-CM | POA: Diagnosis not present

## 2017-02-24 DIAGNOSIS — Z992 Dependence on renal dialysis: Secondary | ICD-10-CM | POA: Diagnosis not present

## 2017-02-24 DIAGNOSIS — N186 End stage renal disease: Secondary | ICD-10-CM | POA: Diagnosis not present

## 2017-02-25 DIAGNOSIS — Z992 Dependence on renal dialysis: Secondary | ICD-10-CM | POA: Diagnosis not present

## 2017-02-25 DIAGNOSIS — N186 End stage renal disease: Secondary | ICD-10-CM | POA: Diagnosis not present

## 2017-02-26 ENCOUNTER — Ambulatory Visit
Admission: RE | Admit: 2017-02-26 | Discharge: 2017-02-26 | Disposition: A | Discharge status: Home / Self Care | Payer: Medicare HMO | Source: Ambulatory Visit | Attending: Gastroenterology | Admitting: Gastroenterology

## 2017-02-26 ENCOUNTER — Encounter
Admission: RE | Disposition: A | Discharge status: Home / Self Care | Payer: Self-pay | Source: Ambulatory Visit | Attending: Gastroenterology

## 2017-02-26 ENCOUNTER — Other Ambulatory Visit: Payer: Self-pay

## 2017-02-26 DIAGNOSIS — K922 Gastrointestinal hemorrhage, unspecified: Secondary | ICD-10-CM

## 2017-02-26 DIAGNOSIS — Z992 Dependence on renal dialysis: Secondary | ICD-10-CM | POA: Diagnosis not present

## 2017-02-26 DIAGNOSIS — N186 End stage renal disease: Secondary | ICD-10-CM | POA: Diagnosis not present

## 2017-02-26 HISTORY — PX: GIVENS CAPSULE STUDY: SHX5432

## 2017-02-26 SURGERY — IMAGING PROCEDURE, GI TRACT, INTRALUMINAL, VIA CAPSULE

## 2017-02-27 ENCOUNTER — Encounter: Payer: Self-pay | Admitting: Gastroenterology

## 2017-02-27 DIAGNOSIS — Z992 Dependence on renal dialysis: Secondary | ICD-10-CM | POA: Diagnosis not present

## 2017-02-27 DIAGNOSIS — N186 End stage renal disease: Secondary | ICD-10-CM | POA: Diagnosis not present

## 2017-02-28 DIAGNOSIS — N186 End stage renal disease: Secondary | ICD-10-CM | POA: Diagnosis not present

## 2017-02-28 DIAGNOSIS — Z992 Dependence on renal dialysis: Secondary | ICD-10-CM | POA: Diagnosis not present

## 2017-03-01 DIAGNOSIS — Z992 Dependence on renal dialysis: Secondary | ICD-10-CM | POA: Diagnosis not present

## 2017-03-01 DIAGNOSIS — N186 End stage renal disease: Secondary | ICD-10-CM | POA: Diagnosis not present

## 2017-03-02 DIAGNOSIS — Z992 Dependence on renal dialysis: Secondary | ICD-10-CM | POA: Diagnosis not present

## 2017-03-02 DIAGNOSIS — N186 End stage renal disease: Secondary | ICD-10-CM | POA: Diagnosis not present

## 2017-03-03 DIAGNOSIS — N186 End stage renal disease: Secondary | ICD-10-CM | POA: Diagnosis not present

## 2017-03-03 DIAGNOSIS — Z992 Dependence on renal dialysis: Secondary | ICD-10-CM | POA: Diagnosis not present

## 2017-03-04 DIAGNOSIS — Z992 Dependence on renal dialysis: Secondary | ICD-10-CM | POA: Diagnosis not present

## 2017-03-04 DIAGNOSIS — N186 End stage renal disease: Secondary | ICD-10-CM | POA: Diagnosis not present

## 2017-03-05 ENCOUNTER — Ambulatory Visit: Payer: Medicare HMO | Admitting: Gastroenterology

## 2017-03-05 DIAGNOSIS — N186 End stage renal disease: Secondary | ICD-10-CM | POA: Diagnosis not present

## 2017-03-05 DIAGNOSIS — Z992 Dependence on renal dialysis: Secondary | ICD-10-CM | POA: Diagnosis not present

## 2017-03-06 DIAGNOSIS — N186 End stage renal disease: Secondary | ICD-10-CM | POA: Diagnosis not present

## 2017-03-06 DIAGNOSIS — Z992 Dependence on renal dialysis: Secondary | ICD-10-CM | POA: Diagnosis not present

## 2017-03-07 DIAGNOSIS — Z992 Dependence on renal dialysis: Secondary | ICD-10-CM | POA: Diagnosis not present

## 2017-03-07 DIAGNOSIS — N186 End stage renal disease: Secondary | ICD-10-CM | POA: Diagnosis not present

## 2017-03-08 ENCOUNTER — Telehealth: Payer: Self-pay | Admitting: Gastroenterology

## 2017-03-08 DIAGNOSIS — Z992 Dependence on renal dialysis: Secondary | ICD-10-CM | POA: Diagnosis not present

## 2017-03-08 DIAGNOSIS — N186 End stage renal disease: Secondary | ICD-10-CM | POA: Diagnosis not present

## 2017-03-08 NOTE — Telephone Encounter (Signed)
Patient LVM wanting procedure results.

## 2017-03-09 DIAGNOSIS — N186 End stage renal disease: Secondary | ICD-10-CM | POA: Diagnosis not present

## 2017-03-09 DIAGNOSIS — Z992 Dependence on renal dialysis: Secondary | ICD-10-CM | POA: Diagnosis not present

## 2017-03-09 NOTE — Telephone Encounter (Signed)
Patient left a voice message for results. Please call [phone]  [phone]

## 2017-03-10 DIAGNOSIS — N186 End stage renal disease: Secondary | ICD-10-CM | POA: Diagnosis not present

## 2017-03-10 DIAGNOSIS — Z992 Dependence on renal dialysis: Secondary | ICD-10-CM | POA: Diagnosis not present

## 2017-03-11 DIAGNOSIS — Z992 Dependence on renal dialysis: Secondary | ICD-10-CM | POA: Diagnosis not present

## 2017-03-11 DIAGNOSIS — N186 End stage renal disease: Secondary | ICD-10-CM | POA: Diagnosis not present

## 2017-03-12 ENCOUNTER — Telehealth: Payer: Self-pay

## 2017-03-12 DIAGNOSIS — Z992 Dependence on renal dialysis: Secondary | ICD-10-CM | POA: Diagnosis not present

## 2017-03-12 DIAGNOSIS — N186 End stage renal disease: Secondary | ICD-10-CM | POA: Diagnosis not present

## 2017-03-12 NOTE — Telephone Encounter (Signed)
-----   Message from Lucilla Lame, MD sent at 03/09/2017 10:59 AM EDT ----- The capsule study was already read by Dr. Vicente Males. No active bleeding and a polyp. He wrote that he wants her to come in and talk to him about the poylp. ----- Message ----- From: Leontine Locket, CMA Sent: 03/09/2017   8:27 AM To: Lucilla Lame, MD  Dr. Allen Norris,   Can you read this Endoscopy for me please?  Patient is calling for results.  Thanks. Pavle Wiler C.

## 2017-03-12 NOTE — Telephone Encounter (Signed)
Advised results per Dr. Vicente Males and Dr. Allen Norris.  No active source of bleeding being shown on capsule study.

## 2017-03-13 DIAGNOSIS — N186 End stage renal disease: Secondary | ICD-10-CM | POA: Diagnosis not present

## 2017-03-13 DIAGNOSIS — Z992 Dependence on renal dialysis: Secondary | ICD-10-CM | POA: Diagnosis not present

## 2017-03-13 NOTE — Progress Notes (Signed)
[facility]  [phone] [phone]  ID: Bethany Lee OB: 04-Nov-1950  MR#: [id number]  RXV#:[id number]  Patient Care Team: Tracie Harrier, MD as PCP - General (Internal Medicine)  CHIEF COMPLAINT: Anemia secondary to chronic renal insufficiency.  INTERVAL HISTORY: Patient returns to clinic for hospital follow-up after being admitted with severe anemia. She required multiple units of packed red blood cells. Colonoscopy, EGD, capsule endoscopy did not reveal an etiology. Patient reports she did have extensive bleeding after a fistula revision. She currently is improved and nearly back to her baseline. Her hemoglobin continues to trend up.  She has no neurologic complaints. She denies any recent fevers or illnesses. She has a good appetite and denies weight loss. She has no chest pain or shortness of breath. She denies any nausea, vomiting, constipation, or diarrhea. She has no melena or hematochezia. She has no urinary complaints. Patient  offers no specific complaints today.  REVIEW OF SYSTEMS:   Review of Systems  Constitutional: Negative for fever, malaise/fatigue and weight loss.  Respiratory: Negative.  Negative for cough and shortness of breath.   Cardiovascular: Negative.  Negative for chest pain and leg swelling.  Gastrointestinal: Negative.  Negative for abdominal pain, blood in stool and melena.  Genitourinary: Negative.   Musculoskeletal: Negative.   Skin: Negative.  Negative for rash.  Neurological: Negative.  Negative for sensory change and weakness.  Psychiatric/Behavioral: Negative.  The patient is not nervous/anxious.     As per HPI. Otherwise, a complete review of systems is negative.  PAST MEDICAL HISTORY: Past Medical History:  Diagnosis Date  . Anemia   . CHF (congestive heart failure) ([place])   . Chronic kidney disease    peritoneal dialysis  . Complication of anesthesia   . Coronary artery disease   . Diabetes  mellitus without complication ([place])   . Dialysis patient [facility])    Peritoneal dialysis patient  . Dyspnea    doe  . Edema   . History of recent blood transfusion 01/2017  . Hypercholesterolemia   . Hypertension   . Hypothyroidism   . Neuropathy   . PONV (postoperative nausea and vomiting)   . Shingles    October-November 2017  . Tremors of nervous system    intermittent when taking gabapentin    PAST SURGICAL HISTORY: Past Surgical History:  Procedure Laterality Date  . A/V FISTULAGRAM Left 01/16/2017   Procedure: A/V Fistulagram;  Surgeon: Katha Cabal, MD;  Location: [place] CV LAB;  Service: Cardiovascular;  Laterality: Left;  . A/V SHUNT INTERVENTION N/A 01/16/2017   Procedure: A/V Shunt Intervention;  Surgeon: Katha Cabal, MD;  Location: [place] CV LAB;  Service: Cardiovascular;  Laterality: N/A;  . ABDOMINAL HYSTERECTOMY    . AV FISTULA PLACEMENT Left 11/30/2016   Procedure: ARTERIOVENOUS (AV) FISTULA CREATION ( EXPLORE FOR CREATION BRACHIOCEPHALIC);  Surgeon: Algernon Huxley, MD;  Location: [facility] ORS;  Service: Vascular;  Laterality: Left;  . CAPD INSERTION N/A 11/25/2015   Procedure: LAPAROSCOPIC INSERTION CONTINUOUS AMBULATORY PERITONEAL DIALYSIS  (CAPD) CATHETER;  Surgeon: Algernon Huxley, MD;  Location: [facility] ORS;  Service: Vascular;  Laterality: N/A;  . CAPD INSERTION N/A 01/06/2016   Procedure: LAPAROSCOPIC INSERTION CONTINUOUS AMBULATORY PERITONEAL DIALYSIS  (CAPD) CATHETER REVISION ;  Surgeon: Algernon Huxley, MD;  Location: [facility] ORS;  Service: Vascular;  Laterality: N/A;  . CAPD INSERTION N/A 10/11/2016   Procedure: LAPAROSCOPIC INSERTION CONTINUOUS AMBULATORY PERITONEAL DIALYSIS  (CAPD) CATHETER ( REVISION );  Surgeon:  Algernon Huxley, MD;  Location: [facility] ORS;  Service: Vascular;  Laterality: N/A;  . CARDIAC CATHETERIZATION    . CATARACT EXTRACTION W/PHACO Right 05/25/2016   Procedure: CATARACT EXTRACTION PHACO AND INTRAOCULAR LENS PLACEMENT (IOC);  Surgeon:  Eulogio Bear, MD;  Location: [facility] ORS;  Service: Ophthalmology;  Laterality: Right;  Lot # [id number] H [place]: 00:5.2 AP%: 11.3 CDE: 7.21  . COLONOSCOPY WITH PROPOFOL N/A 02/15/2017   Procedure: COLONOSCOPY WITH PROPOFOL;  Surgeon: [name], MD;  Location: [facility] ENDOSCOPY;  Service: Gastroenterology;  Laterality: N/A;  . CORONARY ANGIOPLASTY    . CORONARY STENT INTERVENTION N/A 09/07/2016   Procedure: Coronary Stent Intervention;  Surgeon: Isaias Cowman, MD;  Location: [place] [facility] LAB;  Service: Cardiovascular;  Laterality: N/A;  . DIALYSIS/PERMA CATHETER INSERTION N/A 09/21/2016   Procedure: Dialysis/Perma Catheter Insertion;  Surgeon: Algernon Huxley, MD;  Location: [place] CV LAB;  Service: Cardiovascular;  Laterality: N/A;  . DIALYSIS/PERMA CATHETER REMOVAL Right 11/30/2016   Procedure: DIALYSIS/PERMA CATHETER REMOVAL;  Surgeon: Algernon Huxley, MD;  Location: [facility] ORS;  Service: Vascular;  Laterality: Right;  . ESOPHAGOGASTRODUODENOSCOPY (EGD) WITH PROPOFOL N/A 02/15/2017   Procedure: ESOPHAGOGASTRODUODENOSCOPY (EGD) WITH PROPOFOL;  Surgeon: [name], MD;  Location: [facility] ENDOSCOPY;  Service: Gastroenterology;  Laterality: N/A;  . EYE SURGERY    . GIVENS CAPSULE STUDY N/A 02/26/2017   Procedure: GIVENS CAPSULE STUDY;  Surgeon: [name], MD;  Location: [facility] ENDOSCOPY;  Service: Gastroenterology;  Laterality: N/A;  . LEFT HEART CATH AND CORONARY ANGIOGRAPHY Left 09/07/2016   Procedure: Left Heart Cath and Coronary Angiography;  Surgeon: Isaias Cowman, MD;  Location: [place] CV LAB;  Service: Cardiovascular;  Laterality: Left;  . LIGATION OF ARTERIOVENOUS  FISTULA Left 02/07/2017   Procedure: LIGATION OF ARTERIOVENOUS  FISTULA ( BANDING BRACHIAL CEPHALIC );  Surgeon: Katha Cabal, MD;  Location: [facility] ORS;  Service: Vascular;  Laterality: Left;  . TUBAL LIGATION    . UPPER EXTREMITY ANGIOGRAPHY Left 01/19/2017   Procedure: Upper Extremity Angiography;  Surgeon: Katha Cabal, MD;  Location: [place] CV LAB;  Service: Cardiovascular;  Laterality: Left;    FAMILY HISTORY: Reviewed and unchanged. No reported history of malignancy or chronic disease.     ADVANCED DIRECTIVES:    HEALTH MAINTENANCE: Social History  Substance Use Topics  . Smoking status: Former Smoker    Quit date: 10/09/2008  . Smokeless tobacco: Never Used  . Alcohol use No     Colonoscopy:  PAP:  Bone density:  Lipid panel:  Allergies  Allergen Reactions  . Penicillins     Childhood loss of consciousness  Has patient had a PCN reaction causing immediate rash, facial/tongue/throat swelling, SOB or lightheadedness with hypotension: Yes Has patient had a PCN reaction causing severe rash involving mucus membranes or skin necrosis: No Has patient had a PCN reaction that required hospitalization Unknown Has patient had a PCN reaction occurring within the last 10 years: Unknown If all of the above answers are "NO", then may proceed with Cephalosporin use.   . Valacyclovir Nausea Only    Current Outpatient Prescriptions  Medication Sig Dispense Refill  . albuterol (PROVENTIL HFA;VENTOLIN HFA) 108 (90 Base) MCG/ACT inhaler Inhale 2 puffs into the lungs every 6 (six) hours as needed for wheezing or shortness of breath.    Marland Kitchen amLODipine (NORVASC) 5 MG tablet Take 5 mg by mouth daily.    Marland Kitchen aspirin EC 81 MG tablet Take 1 tablet (81 mg total) by mouth daily.  Start taking after NOV 15 (Patient taking differently: Take 81 mg by mouth daily. ) 30 tablet 0  . calcium acetate (PHOSLO) 667 MG capsule Take 2,001 mg by mouth 3 (three) times daily with meals.    . carvedilol (COREG) 12.5 MG tablet Take 1 tablet (12.5 mg total) by mouth 2 (two) times daily with a meal. (Patient taking differently: Take 25 mg by mouth 2 (two) times daily with a meal. ) 60 tablet 0  . furosemide (LASIX) 80 MG tablet Take 80 mg by mouth 2 (two) times daily.    Marland Kitchen gentamicin cream (GARAMYCIN) 0.1 % Apply 1  application topically daily. (Patient taking differently: Apply 1 application topically daily. Catheter dressing changes) 15 g 0  . hydrALAZINE (APRESOLINE) 50 MG tablet Take 50 mg by mouth 2 (two) times daily.    . insulin aspart (NOVOLOG FLEXPEN) 100 UNIT/ML FlexPen Inject 4-8 Units into the skin 3 (three) times daily with meals. Sliding scale below 200 take 4 units, 200-250=6 units, >250= 8 units    . Insulin Glargine (TOUJEO SOLOSTAR) 300 UNIT/ML SOPN Inject 20 Units into the skin at bedtime. (Patient taking differently: Inject 24 Units into the skin at bedtime. ) 30 pen 5  . levothyroxine (SYNTHROID, LEVOTHROID) 75 MCG tablet Take 75 mcg by mouth daily before breakfast.    . lovastatin (MEVACOR) 40 MG tablet Take 40 mg by mouth at bedtime.    . metolazone (ZAROXOLYN) 5 MG tablet Take 5 mg by mouth daily.     . Multiple Vitamins-Minerals (DIALYVITE 800/ULTRA D) TABS Take 1 tablet by mouth daily.    . Pyridoxine HCl (VITAMIN B6 PO) Take by mouth.    . traMADol (ULTRAM) 50 MG tablet Take 1 tablet (50 mg total) by mouth every 8 (eight) hours as needed for moderate pain or severe pain. 40 tablet 0  . vitamin B-12 (CYANOCOBALAMIN) 1000 MCG tablet Take 1,000 mcg by mouth daily.    . pantoprazole (PROTONIX) 40 MG tablet Take 1 tablet (40 mg total) by mouth 2 (two) times daily. (Patient not taking: Reported on 03/14/2017) 60 tablet 0   No current facility-administered medications for this visit.     OBJECTIVE: Vitals:   03/14/17 1550  BP: 128/61  Pulse: 74  Resp: 20  Temp: (!) 97.4 F (36.3 C)     Body mass index is 29.26 kg/m.    ECOG FS:0 - Asymptomatic  General: Well-developed, well-nourished, no acute distress. Eyes: Pink conjunctiva, anicteric sclera. Lungs: Clear to auscultation bilaterally. Heart: Regular rate and rhythm. No rubs, murmurs, or gallops. Abdomen: Soft, nontender, nondistended. No organomegaly noted, normoactive bowel sounds. Musculoskeletal: No edema, cyanosis, or  clubbing. Neuro: Alert, answering all questions appropriately. Cranial nerves grossly intact. Skin: No rashes or petechiae noted. Psych: Normal affect.   LAB RESULTS:  Lab Results  Component Value Date   NA 134 (L) 02/16/2017   K 4.5 02/16/2017   CL 94 (L) 02/16/2017   CO2 25 02/16/2017   GLUCOSE 124 (H) 02/16/2017   BUN 86 (H) 02/16/2017   CREATININE 14.22 (H) 02/16/2017   CALCIUM 7.3 (L) 02/16/2017   PROT 5.0 (L) 02/10/2017   ALBUMIN 2.5 (L) 02/10/2017   AST 12 (L) 02/10/2017   ALT 11 (L) 02/10/2017   ALKPHOS 32 (L) 02/10/2017   BILITOT 0.7 02/10/2017   GFRNONAA 2 (L) 02/16/2017   GFRAA 3 (L) 02/16/2017    Lab Results  Component Value Date   WBC 7.9 03/14/2017   NEUTROABS 5.9 03/14/2017  HGB 10.5 (L) 03/14/2017   HCT 30.9 (L) 03/14/2017   MCV 90.3 03/14/2017   PLT 149 (L) 03/14/2017   Lab Results  Component Value Date   IRON 79 03/14/2017   TIBC 177 (L) 03/14/2017   IRONPCTSAT 45 (H) 03/14/2017    Lab Results  Component Value Date   FERRITIN 539 (H) 03/14/2017     STUDIES: No results found.  ASSESSMENT: Anemia secondary to chronic renal insufficiency.  PLAN:    1. Anemia: Patient's hemoglobin has trended up and is now greater than 10.0. Unclear etiology of her significant anemia while in the hospital, but possibly secondary to extensive blood cell loss during a fistula repair. Colonoscopy, EGD, and capsule endoscopy did not reveal definitive source. No intervention is needed at this time. She continues to receive Procrit with her dialysis. No follow-up has been scheduled. Please refer patient back if there are any questions or concerns. 2. Chronic renal failure: Continue dialysis as instructed.  3. Hypertension: Patient's blood pressure is within normal limits today. Continue current medications as prescribed.  Patient expressed understanding and was in agreement with this plan. She also understands that She can call clinic at any time with any  questions, concerns, or complaints.    Lloyd Huger, MD   03/16/2017 3:39 PM

## 2017-03-14 ENCOUNTER — Inpatient Hospital Stay (HOSPITAL_BASED_OUTPATIENT_CLINIC_OR_DEPARTMENT_OTHER): Payer: Medicare HMO | Admitting: Oncology

## 2017-03-14 ENCOUNTER — Inpatient Hospital Stay: Payer: Medicare HMO | Attending: Oncology

## 2017-03-14 VITALS — BP 128/61 | HR 74 | Temp 97.4°F | Resp 20 | Wt 149.8 lb

## 2017-03-14 DIAGNOSIS — I130 Hypertensive heart and chronic kidney disease with heart failure and stage 1 through stage 4 chronic kidney disease, or unspecified chronic kidney disease: Secondary | ICD-10-CM | POA: Diagnosis not present

## 2017-03-14 DIAGNOSIS — N189 Chronic kidney disease, unspecified: Secondary | ICD-10-CM | POA: Diagnosis not present

## 2017-03-14 DIAGNOSIS — Z880 Allergy status to penicillin: Secondary | ICD-10-CM

## 2017-03-14 DIAGNOSIS — E1122 Type 2 diabetes mellitus with diabetic chronic kidney disease: Secondary | ICD-10-CM | POA: Insufficient documentation

## 2017-03-14 DIAGNOSIS — Z7982 Long term (current) use of aspirin: Secondary | ICD-10-CM

## 2017-03-14 DIAGNOSIS — Z79899 Other long term (current) drug therapy: Secondary | ICD-10-CM | POA: Diagnosis not present

## 2017-03-14 DIAGNOSIS — E039 Hypothyroidism, unspecified: Secondary | ICD-10-CM | POA: Insufficient documentation

## 2017-03-14 DIAGNOSIS — Z992 Dependence on renal dialysis: Secondary | ICD-10-CM | POA: Diagnosis not present

## 2017-03-14 DIAGNOSIS — Z87891 Personal history of nicotine dependence: Secondary | ICD-10-CM

## 2017-03-14 DIAGNOSIS — D631 Anemia in chronic kidney disease: Secondary | ICD-10-CM | POA: Diagnosis not present

## 2017-03-14 DIAGNOSIS — N186 End stage renal disease: Secondary | ICD-10-CM | POA: Diagnosis not present

## 2017-03-14 DIAGNOSIS — E7800 Pure hypercholesterolemia, unspecified: Secondary | ICD-10-CM | POA: Insufficient documentation

## 2017-03-14 DIAGNOSIS — I2510 Atherosclerotic heart disease of native coronary artery without angina pectoris: Secondary | ICD-10-CM | POA: Insufficient documentation

## 2017-03-14 DIAGNOSIS — Z794 Long term (current) use of insulin: Secondary | ICD-10-CM | POA: Diagnosis not present

## 2017-03-14 DIAGNOSIS — Z7984 Long term (current) use of oral hypoglycemic drugs: Secondary | ICD-10-CM

## 2017-03-14 LAB — IRON AND TIBC
IRON: 79 ug/dL (ref 28–170)
Saturation Ratios: 45 % — ABNORMAL HIGH (ref 10.4–31.8)
TIBC: 177 ug/dL — AB (ref 250–450)
UIBC: 98 ug/dL

## 2017-03-14 LAB — CBC WITH DIFFERENTIAL/PLATELET
BASOS PCT: 1 %
Basophils Absolute: 0.1 10*3/uL (ref 0–0.1)
EOS ABS: 0.3 10*3/uL (ref 0–0.7)
EOS PCT: 3 %
HCT: 30.9 % — ABNORMAL LOW (ref 35.0–47.0)
Hemoglobin: 10.5 g/dL — ABNORMAL LOW (ref 12.0–16.0)
LYMPHS ABS: 1.1 10*3/uL (ref 1.0–3.6)
Lymphocytes Relative: 15 %
MCH: 30.6 pg (ref 26.0–34.0)
MCHC: 33.9 g/dL (ref 32.0–36.0)
MCV: 90.3 fL (ref 80.0–100.0)
MONOS PCT: 6 %
Monocytes Absolute: 0.5 10*3/uL (ref 0.2–0.9)
NEUTROS PCT: 75 %
Neutro Abs: 5.9 10*3/uL (ref 1.4–6.5)
PLATELETS: 149 10*3/uL — AB (ref 150–440)
RBC: 3.42 MIL/uL — ABNORMAL LOW (ref 3.80–5.20)
RDW: 17.4 % — AB (ref 11.5–14.5)
WBC: 7.9 10*3/uL (ref 3.6–11.0)

## 2017-03-14 LAB — FERRITIN: FERRITIN: 539 ng/mL — AB (ref 11–307)

## 2017-03-14 NOTE — Progress Notes (Signed)
Patient reports cough today, denies other concerns.

## 2017-03-15 DIAGNOSIS — E113412 Type 2 diabetes mellitus with severe nonproliferative diabetic retinopathy with macular edema, left eye: Secondary | ICD-10-CM | POA: Diagnosis not present

## 2017-03-15 DIAGNOSIS — N186 End stage renal disease: Secondary | ICD-10-CM | POA: Diagnosis not present

## 2017-03-15 DIAGNOSIS — Z992 Dependence on renal dialysis: Secondary | ICD-10-CM | POA: Diagnosis not present

## 2017-03-16 DIAGNOSIS — Z992 Dependence on renal dialysis: Secondary | ICD-10-CM | POA: Diagnosis not present

## 2017-03-16 DIAGNOSIS — N186 End stage renal disease: Secondary | ICD-10-CM | POA: Diagnosis not present

## 2017-03-17 DIAGNOSIS — N186 End stage renal disease: Secondary | ICD-10-CM | POA: Diagnosis not present

## 2017-03-17 DIAGNOSIS — Z992 Dependence on renal dialysis: Secondary | ICD-10-CM | POA: Diagnosis not present

## 2017-03-18 DIAGNOSIS — Z992 Dependence on renal dialysis: Secondary | ICD-10-CM | POA: Diagnosis not present

## 2017-03-18 DIAGNOSIS — N186 End stage renal disease: Secondary | ICD-10-CM | POA: Diagnosis not present

## 2017-03-19 DIAGNOSIS — N186 End stage renal disease: Secondary | ICD-10-CM | POA: Diagnosis not present

## 2017-03-19 DIAGNOSIS — Z992 Dependence on renal dialysis: Secondary | ICD-10-CM | POA: Diagnosis not present

## 2017-03-20 DIAGNOSIS — Z992 Dependence on renal dialysis: Secondary | ICD-10-CM | POA: Diagnosis not present

## 2017-03-20 DIAGNOSIS — N186 End stage renal disease: Secondary | ICD-10-CM | POA: Diagnosis not present

## 2017-03-21 DIAGNOSIS — Z992 Dependence on renal dialysis: Secondary | ICD-10-CM | POA: Diagnosis not present

## 2017-03-21 DIAGNOSIS — N186 End stage renal disease: Secondary | ICD-10-CM | POA: Diagnosis not present

## 2017-03-22 DIAGNOSIS — N186 End stage renal disease: Secondary | ICD-10-CM | POA: Diagnosis not present

## 2017-03-22 DIAGNOSIS — Z992 Dependence on renal dialysis: Secondary | ICD-10-CM | POA: Diagnosis not present

## 2017-03-23 DIAGNOSIS — N186 End stage renal disease: Secondary | ICD-10-CM | POA: Diagnosis not present

## 2017-03-23 DIAGNOSIS — Z992 Dependence on renal dialysis: Secondary | ICD-10-CM | POA: Diagnosis not present

## 2017-03-24 DIAGNOSIS — N186 End stage renal disease: Secondary | ICD-10-CM | POA: Diagnosis not present

## 2017-03-24 DIAGNOSIS — Z992 Dependence on renal dialysis: Secondary | ICD-10-CM | POA: Diagnosis not present

## 2017-03-25 DIAGNOSIS — N186 End stage renal disease: Secondary | ICD-10-CM | POA: Diagnosis not present

## 2017-03-25 DIAGNOSIS — Z992 Dependence on renal dialysis: Secondary | ICD-10-CM | POA: Diagnosis not present

## 2017-03-26 DIAGNOSIS — N186 End stage renal disease: Secondary | ICD-10-CM | POA: Diagnosis not present

## 2017-03-26 DIAGNOSIS — Z992 Dependence on renal dialysis: Secondary | ICD-10-CM | POA: Diagnosis not present

## 2017-03-27 DIAGNOSIS — Z992 Dependence on renal dialysis: Secondary | ICD-10-CM | POA: Diagnosis not present

## 2017-03-27 DIAGNOSIS — N186 End stage renal disease: Secondary | ICD-10-CM | POA: Diagnosis not present

## 2017-03-28 DIAGNOSIS — Z992 Dependence on renal dialysis: Secondary | ICD-10-CM | POA: Diagnosis not present

## 2017-03-28 DIAGNOSIS — N186 End stage renal disease: Secondary | ICD-10-CM | POA: Diagnosis not present

## 2017-03-29 DIAGNOSIS — Z992 Dependence on renal dialysis: Secondary | ICD-10-CM | POA: Diagnosis not present

## 2017-03-29 DIAGNOSIS — N186 End stage renal disease: Secondary | ICD-10-CM | POA: Diagnosis not present

## 2017-03-30 DIAGNOSIS — E119 Type 2 diabetes mellitus without complications: Secondary | ICD-10-CM | POA: Diagnosis not present

## 2017-03-30 DIAGNOSIS — Z992 Dependence on renal dialysis: Secondary | ICD-10-CM | POA: Diagnosis not present

## 2017-03-30 DIAGNOSIS — N186 End stage renal disease: Secondary | ICD-10-CM | POA: Diagnosis not present

## 2017-03-30 DIAGNOSIS — Z794 Long term (current) use of insulin: Secondary | ICD-10-CM | POA: Diagnosis not present

## 2017-03-30 DIAGNOSIS — E785 Hyperlipidemia, unspecified: Secondary | ICD-10-CM | POA: Diagnosis not present

## 2017-03-31 DIAGNOSIS — Z992 Dependence on renal dialysis: Secondary | ICD-10-CM | POA: Diagnosis not present

## 2017-03-31 DIAGNOSIS — N186 End stage renal disease: Secondary | ICD-10-CM | POA: Diagnosis not present

## 2017-04-01 DIAGNOSIS — N186 End stage renal disease: Secondary | ICD-10-CM | POA: Diagnosis not present

## 2017-04-01 DIAGNOSIS — Z992 Dependence on renal dialysis: Secondary | ICD-10-CM | POA: Diagnosis not present

## 2017-04-02 DIAGNOSIS — R05 Cough: Secondary | ICD-10-CM | POA: Diagnosis not present

## 2017-04-02 DIAGNOSIS — Z992 Dependence on renal dialysis: Secondary | ICD-10-CM | POA: Diagnosis not present

## 2017-04-02 DIAGNOSIS — E1142 Type 2 diabetes mellitus with diabetic polyneuropathy: Secondary | ICD-10-CM | POA: Diagnosis not present

## 2017-04-02 DIAGNOSIS — E1122 Type 2 diabetes mellitus with diabetic chronic kidney disease: Secondary | ICD-10-CM | POA: Diagnosis not present

## 2017-04-02 DIAGNOSIS — E039 Hypothyroidism, unspecified: Secondary | ICD-10-CM | POA: Diagnosis not present

## 2017-04-02 DIAGNOSIS — Z794 Long term (current) use of insulin: Secondary | ICD-10-CM | POA: Diagnosis not present

## 2017-04-02 DIAGNOSIS — D638 Anemia in other chronic diseases classified elsewhere: Secondary | ICD-10-CM | POA: Diagnosis not present

## 2017-04-02 DIAGNOSIS — N186 End stage renal disease: Secondary | ICD-10-CM | POA: Diagnosis not present

## 2017-04-02 DIAGNOSIS — I10 Essential (primary) hypertension: Secondary | ICD-10-CM | POA: Diagnosis not present

## 2017-04-02 DIAGNOSIS — E11649 Type 2 diabetes mellitus with hypoglycemia without coma: Secondary | ICD-10-CM | POA: Diagnosis not present

## 2017-04-02 DIAGNOSIS — E113412 Type 2 diabetes mellitus with severe nonproliferative diabetic retinopathy with macular edema, left eye: Secondary | ICD-10-CM | POA: Diagnosis not present

## 2017-04-03 ENCOUNTER — Encounter: Payer: Self-pay | Admitting: Gastroenterology

## 2017-04-03 ENCOUNTER — Ambulatory Visit (INDEPENDENT_AMBULATORY_CARE_PROVIDER_SITE_OTHER): Payer: Medicare HMO | Admitting: Gastroenterology

## 2017-04-03 DIAGNOSIS — N186 End stage renal disease: Secondary | ICD-10-CM | POA: Diagnosis not present

## 2017-04-03 DIAGNOSIS — D649 Anemia, unspecified: Secondary | ICD-10-CM | POA: Diagnosis not present

## 2017-04-03 DIAGNOSIS — Z992 Dependence on renal dialysis: Secondary | ICD-10-CM | POA: Diagnosis not present

## 2017-04-03 NOTE — Progress Notes (Signed)
[name] MD, MRCP(U.K) [address]  [place]  [place], [place] [address]  Main: [phone]  Fax: [phone]   Primary Care Physician: Tracie Harrier, MD  Primary Gastroenterologist:  Dr. [name]   No chief complaint on file.   HPI: Bethany Lee is a 66 y.o. female     Summary of history : She is here to follow up for anemia . She was initially seen on 02/11/17 by Dr Tarri Glenn when she presented with a GI bleed. She was on plavix and asprin at that time. She had black tarry stools. I performed an EGD+colonoscopy on 02/15/17 and found duodenitis , poor prep of the colon , internal hemorrhoids and a tiny polyp was excised which was an adenoma . The prep was inadequate for screening purposes. A subsequent capsule study showed a tiny polyp which I feel are lymph vessels.   Interval history   7//2018- 04/03/2017   Hb is coming up gradually .   CBC Latest Ref Rng & Units 03/14/2017 02/16/2017 02/15/2017  WBC 3.6 - 11.0 K/uL 7.9 6.1 -  Hemoglobin 12.0 - 16.0 g/dL 10.5(L) 9.5(L) 7.8(L)  Hematocrit 35.0 - 47.0 % 30.9(L) 27.7(L) 23.0(L)  Platelets 150 - 440 K/uL 149(L) 131(L) -   Denies any overt blood loss.    Current Outpatient Prescriptions  Medication Sig Dispense Refill  . benzonatate (TESSALON) 200 MG capsule Take by mouth.    . iron polysaccharides (NIFEREX) 150 MG capsule Take by mouth.    Marland Kitchen albuterol (PROVENTIL HFA;VENTOLIN HFA) 108 (90 Base) MCG/ACT inhaler Inhale 2 puffs into the lungs every 6 (six) hours as needed for wheezing or shortness of breath.    Marland Kitchen amLODipine (NORVASC) 5 MG tablet Take 5 mg by mouth daily.    Marland Kitchen aspirin EC 81 MG tablet Take 1 tablet (81 mg total) by mouth daily. Start taking after NOV 15 (Patient taking differently: Take 81 mg by mouth daily. ) 30 tablet 0  . calcium acetate (PHOSLO) 667 MG capsule Take 2,001 mg by mouth 3 (three) times daily with meals.    . carvedilol (COREG) 12.5 MG tablet Take 1 tablet (12.5 mg total) by  mouth 2 (two) times daily with a meal. (Patient taking differently: Take 25 mg by mouth 2 (two) times daily with a meal. ) 60 tablet 0  . clopidogrel (PLAVIX) 75 MG tablet Take by mouth.    . dextromethorphan (DELSYM) 30 MG/5ML liquid Take by mouth.    . furosemide (LASIX) 80 MG tablet Take 80 mg by mouth 2 (two) times daily.    Marland Kitchen gentamicin cream (GARAMYCIN) 0.1 % Apply 1 application topically daily. (Patient taking differently: Apply 1 application topically daily. Catheter dressing changes) 15 g 0  . hydrALAZINE (APRESOLINE) 50 MG tablet Take 50 mg by mouth 2 (two) times daily.    . insulin aspart (NOVOLOG FLEXPEN) 100 UNIT/ML FlexPen Inject 4-8 Units into the skin 3 (three) times daily with meals. Sliding scale below 200 take 4 units, 200-250=6 units, >250= 8 units    . insulin glargine (LANTUS) 100 UNIT/ML injection Inject into the skin.    . Insulin Glargine (TOUJEO SOLOSTAR) 300 UNIT/ML SOPN Inject 20 Units into the skin at bedtime. (Patient taking differently: Inject 24 Units into the skin at bedtime. ) 30 pen 5  . levothyroxine (SYNTHROID, LEVOTHROID) 75 MCG tablet Take 75 mcg by mouth daily before breakfast.    . lisinopril (PRINIVIL,ZESTRIL) 10 MG tablet Take by mouth.    Marland Kitchen  lovastatin (MEVACOR) 40 MG tablet Take 40 mg by mouth at bedtime.    . metolazone (ZAROXOLYN) 5 MG tablet Take 5 mg by mouth daily.     . Multiple Vitamins-Minerals (DIALYVITE 800/ULTRA D) TABS Take 1 tablet by mouth daily.    . pantoprazole (PROTONIX) 40 MG tablet Take 1 tablet (40 mg total) by mouth 2 (two) times daily. (Patient not taking: Reported on 03/14/2017) 60 tablet 0  . Pyridoxine HCl (VITAMIN B6 PO) Take by mouth.    . sevelamer carbonate (RENVELA) 800 MG tablet Take by mouth.    . traMADol (ULTRAM) 50 MG tablet Take 1 tablet (50 mg total) by mouth every 8 (eight) hours as needed for moderate pain or severe pain. 40 tablet 0  . vitamin B-12 (CYANOCOBALAMIN) 1000 MCG tablet Take 1,000 mcg by mouth daily.      No current facility-administered medications for this visit.     Allergies as of 04/03/2017 - Review Complete 03/14/2017  Allergen Reaction Noted  . Penicillins  05/25/2015  . Valacyclovir Nausea Only 05/10/2016    ROS:  General: Negative for anorexia, weight loss, fever, chills, fatigue, weakness. ENT: Negative for hoarseness, difficulty swallowing , nasal congestion. CV: Negative for chest pain, angina, palpitations, dyspnea on exertion, peripheral edema.  Respiratory: Negative for dyspnea at rest, dyspnea on exertion, cough, sputum, wheezing.  GI: See history of present illness. GU:  Negative for dysuria, hematuria, urinary incontinence, urinary frequency, nocturnal urination.  Endo: Negative for unusual weight change.    Physical Examination:   There were no vitals taken for this visit.  General: Well-nourished, well-developed in no acute distress.  Eyes: No icterus. Conjunctivae pink. Mouth: Oropharyngeal mucosa moist and pink , no lesions erythema or exudate. Lungs: Clear to auscultation bilaterally. Non-labored. Heart: Regular rate and rhythm, no murmurs rubs or gallops.  Abdomen: Bowel sounds are normal, nontender, nondistended, no hepatosplenomegaly or masses, no abdominal bruits or hernia , no rebound or guarding.   Extremities: No lower extremity edema. No clubbing or deformities. Neuro: Alert and oriented x 3.  Grossly intact. Skin: Warm and dry, no jaundice.   Psych: Alert and cooperative, normal mood and affect.   Imaging Studies: No results found.  Assessment and Plan:   Bethany Lee is a 66 y.o. y/o female here to follow up for anemia. EGD+colonoscopy and capsule study were negative. Hb has been stable. Follows with hematology. Small tiny polypoid lesion on capsule study likely lymph vessels and needs no further intervention Her recent colonoscopy was inadequate from prep point of view . I did discuss repeat prep and colonosocpy . She will think about  it and let me know.    Dr [name]  MD,MRCP [facility]) Follow up PRN

## 2017-04-04 DIAGNOSIS — Z992 Dependence on renal dialysis: Secondary | ICD-10-CM | POA: Diagnosis not present

## 2017-04-04 DIAGNOSIS — N186 End stage renal disease: Secondary | ICD-10-CM | POA: Diagnosis not present

## 2017-04-05 DIAGNOSIS — N186 End stage renal disease: Secondary | ICD-10-CM | POA: Diagnosis not present

## 2017-04-05 DIAGNOSIS — Z992 Dependence on renal dialysis: Secondary | ICD-10-CM | POA: Diagnosis not present

## 2017-04-06 DIAGNOSIS — Z992 Dependence on renal dialysis: Secondary | ICD-10-CM | POA: Diagnosis not present

## 2017-04-06 DIAGNOSIS — N186 End stage renal disease: Secondary | ICD-10-CM | POA: Diagnosis not present

## 2017-04-07 DIAGNOSIS — N186 End stage renal disease: Secondary | ICD-10-CM | POA: Diagnosis not present

## 2017-04-07 DIAGNOSIS — Z992 Dependence on renal dialysis: Secondary | ICD-10-CM | POA: Diagnosis not present

## 2017-04-08 DIAGNOSIS — N186 End stage renal disease: Secondary | ICD-10-CM | POA: Diagnosis not present

## 2017-04-08 DIAGNOSIS — Z992 Dependence on renal dialysis: Secondary | ICD-10-CM | POA: Diagnosis not present

## 2017-04-09 DIAGNOSIS — N186 End stage renal disease: Secondary | ICD-10-CM | POA: Diagnosis not present

## 2017-04-09 DIAGNOSIS — Z992 Dependence on renal dialysis: Secondary | ICD-10-CM | POA: Diagnosis not present

## 2017-04-10 DIAGNOSIS — N186 End stage renal disease: Secondary | ICD-10-CM | POA: Diagnosis not present

## 2017-04-10 DIAGNOSIS — Z992 Dependence on renal dialysis: Secondary | ICD-10-CM | POA: Diagnosis not present

## 2017-04-11 DIAGNOSIS — N186 End stage renal disease: Secondary | ICD-10-CM | POA: Diagnosis not present

## 2017-04-11 DIAGNOSIS — Z992 Dependence on renal dialysis: Secondary | ICD-10-CM | POA: Diagnosis not present

## 2017-04-12 DIAGNOSIS — Z992 Dependence on renal dialysis: Secondary | ICD-10-CM | POA: Diagnosis not present

## 2017-04-12 DIAGNOSIS — N186 End stage renal disease: Secondary | ICD-10-CM | POA: Diagnosis not present

## 2017-04-13 DIAGNOSIS — N186 End stage renal disease: Secondary | ICD-10-CM | POA: Diagnosis not present

## 2017-04-13 DIAGNOSIS — Z992 Dependence on renal dialysis: Secondary | ICD-10-CM | POA: Diagnosis not present

## 2017-04-14 DIAGNOSIS — N186 End stage renal disease: Secondary | ICD-10-CM | POA: Diagnosis not present

## 2017-04-14 DIAGNOSIS — Z992 Dependence on renal dialysis: Secondary | ICD-10-CM | POA: Diagnosis not present

## 2017-04-15 DIAGNOSIS — Z992 Dependence on renal dialysis: Secondary | ICD-10-CM | POA: Diagnosis not present

## 2017-04-15 DIAGNOSIS — N186 End stage renal disease: Secondary | ICD-10-CM | POA: Diagnosis not present

## 2017-04-16 DIAGNOSIS — N186 End stage renal disease: Secondary | ICD-10-CM | POA: Diagnosis not present

## 2017-04-16 DIAGNOSIS — Z992 Dependence on renal dialysis: Secondary | ICD-10-CM | POA: Diagnosis not present

## 2017-04-17 DIAGNOSIS — N186 End stage renal disease: Secondary | ICD-10-CM | POA: Diagnosis not present

## 2017-04-17 DIAGNOSIS — Z992 Dependence on renal dialysis: Secondary | ICD-10-CM | POA: Diagnosis not present

## 2017-04-18 DIAGNOSIS — N186 End stage renal disease: Secondary | ICD-10-CM | POA: Diagnosis not present

## 2017-04-18 DIAGNOSIS — Z992 Dependence on renal dialysis: Secondary | ICD-10-CM | POA: Diagnosis not present

## 2017-04-19 DIAGNOSIS — N186 End stage renal disease: Secondary | ICD-10-CM | POA: Diagnosis not present

## 2017-04-19 DIAGNOSIS — Z992 Dependence on renal dialysis: Secondary | ICD-10-CM | POA: Diagnosis not present

## 2017-04-20 DIAGNOSIS — Z992 Dependence on renal dialysis: Secondary | ICD-10-CM | POA: Diagnosis not present

## 2017-04-20 DIAGNOSIS — N186 End stage renal disease: Secondary | ICD-10-CM | POA: Diagnosis not present

## 2017-04-21 DIAGNOSIS — N186 End stage renal disease: Secondary | ICD-10-CM | POA: Diagnosis not present

## 2017-04-21 DIAGNOSIS — Z992 Dependence on renal dialysis: Secondary | ICD-10-CM | POA: Diagnosis not present

## 2017-04-22 DIAGNOSIS — Z992 Dependence on renal dialysis: Secondary | ICD-10-CM | POA: Diagnosis not present

## 2017-04-22 DIAGNOSIS — N186 End stage renal disease: Secondary | ICD-10-CM | POA: Diagnosis not present

## 2017-04-23 DIAGNOSIS — Z992 Dependence on renal dialysis: Secondary | ICD-10-CM | POA: Diagnosis not present

## 2017-04-23 DIAGNOSIS — N186 End stage renal disease: Secondary | ICD-10-CM | POA: Diagnosis not present

## 2017-04-23 DIAGNOSIS — Z23 Encounter for immunization: Secondary | ICD-10-CM | POA: Diagnosis not present

## 2017-04-24 DIAGNOSIS — Z23 Encounter for immunization: Secondary | ICD-10-CM | POA: Diagnosis not present

## 2017-04-24 DIAGNOSIS — Z992 Dependence on renal dialysis: Secondary | ICD-10-CM | POA: Diagnosis not present

## 2017-04-24 DIAGNOSIS — N186 End stage renal disease: Secondary | ICD-10-CM | POA: Diagnosis not present

## 2017-04-25 DIAGNOSIS — Z992 Dependence on renal dialysis: Secondary | ICD-10-CM | POA: Diagnosis not present

## 2017-04-25 DIAGNOSIS — Z23 Encounter for immunization: Secondary | ICD-10-CM | POA: Diagnosis not present

## 2017-04-25 DIAGNOSIS — N186 End stage renal disease: Secondary | ICD-10-CM | POA: Diagnosis not present

## 2017-04-26 DIAGNOSIS — Z23 Encounter for immunization: Secondary | ICD-10-CM | POA: Diagnosis not present

## 2017-04-26 DIAGNOSIS — Z992 Dependence on renal dialysis: Secondary | ICD-10-CM | POA: Diagnosis not present

## 2017-04-26 DIAGNOSIS — N186 End stage renal disease: Secondary | ICD-10-CM | POA: Diagnosis not present

## 2017-04-27 DIAGNOSIS — Z794 Long term (current) use of insulin: Secondary | ICD-10-CM | POA: Diagnosis not present

## 2017-04-27 DIAGNOSIS — E119 Type 2 diabetes mellitus without complications: Secondary | ICD-10-CM | POA: Diagnosis not present

## 2017-04-27 DIAGNOSIS — Z23 Encounter for immunization: Secondary | ICD-10-CM | POA: Diagnosis not present

## 2017-04-27 DIAGNOSIS — Z1159 Encounter for screening for other viral diseases: Secondary | ICD-10-CM | POA: Diagnosis not present

## 2017-04-27 DIAGNOSIS — E785 Hyperlipidemia, unspecified: Secondary | ICD-10-CM | POA: Diagnosis not present

## 2017-04-27 DIAGNOSIS — N186 End stage renal disease: Secondary | ICD-10-CM | POA: Diagnosis not present

## 2017-04-27 DIAGNOSIS — Z992 Dependence on renal dialysis: Secondary | ICD-10-CM | POA: Diagnosis not present

## 2017-04-28 DIAGNOSIS — Z23 Encounter for immunization: Secondary | ICD-10-CM | POA: Diagnosis not present

## 2017-04-28 DIAGNOSIS — N186 End stage renal disease: Secondary | ICD-10-CM | POA: Diagnosis not present

## 2017-04-28 DIAGNOSIS — Z992 Dependence on renal dialysis: Secondary | ICD-10-CM | POA: Diagnosis not present

## 2017-04-29 DIAGNOSIS — Z992 Dependence on renal dialysis: Secondary | ICD-10-CM | POA: Diagnosis not present

## 2017-04-29 DIAGNOSIS — Z23 Encounter for immunization: Secondary | ICD-10-CM | POA: Diagnosis not present

## 2017-04-29 DIAGNOSIS — N186 End stage renal disease: Secondary | ICD-10-CM | POA: Diagnosis not present

## 2017-04-30 DIAGNOSIS — N186 End stage renal disease: Secondary | ICD-10-CM | POA: Diagnosis not present

## 2017-04-30 DIAGNOSIS — Z23 Encounter for immunization: Secondary | ICD-10-CM | POA: Diagnosis not present

## 2017-04-30 DIAGNOSIS — Z992 Dependence on renal dialysis: Secondary | ICD-10-CM | POA: Diagnosis not present

## 2017-05-01 DIAGNOSIS — Z23 Encounter for immunization: Secondary | ICD-10-CM | POA: Diagnosis not present

## 2017-05-01 DIAGNOSIS — Z992 Dependence on renal dialysis: Secondary | ICD-10-CM | POA: Diagnosis not present

## 2017-05-01 DIAGNOSIS — N186 End stage renal disease: Secondary | ICD-10-CM | POA: Diagnosis not present

## 2017-05-02 DIAGNOSIS — N186 End stage renal disease: Secondary | ICD-10-CM | POA: Diagnosis not present

## 2017-05-02 DIAGNOSIS — Z23 Encounter for immunization: Secondary | ICD-10-CM | POA: Diagnosis not present

## 2017-05-02 DIAGNOSIS — Z992 Dependence on renal dialysis: Secondary | ICD-10-CM | POA: Diagnosis not present

## 2017-05-03 DIAGNOSIS — N186 End stage renal disease: Secondary | ICD-10-CM | POA: Diagnosis not present

## 2017-05-03 DIAGNOSIS — Z23 Encounter for immunization: Secondary | ICD-10-CM | POA: Diagnosis not present

## 2017-05-03 DIAGNOSIS — Z992 Dependence on renal dialysis: Secondary | ICD-10-CM | POA: Diagnosis not present

## 2017-05-04 DIAGNOSIS — Z992 Dependence on renal dialysis: Secondary | ICD-10-CM | POA: Diagnosis not present

## 2017-05-04 DIAGNOSIS — Z23 Encounter for immunization: Secondary | ICD-10-CM | POA: Diagnosis not present

## 2017-05-04 DIAGNOSIS — N186 End stage renal disease: Secondary | ICD-10-CM | POA: Diagnosis not present

## 2017-05-05 DIAGNOSIS — N186 End stage renal disease: Secondary | ICD-10-CM | POA: Diagnosis not present

## 2017-05-05 DIAGNOSIS — Z992 Dependence on renal dialysis: Secondary | ICD-10-CM | POA: Diagnosis not present

## 2017-05-05 DIAGNOSIS — Z23 Encounter for immunization: Secondary | ICD-10-CM | POA: Diagnosis not present

## 2017-05-06 DIAGNOSIS — Z992 Dependence on renal dialysis: Secondary | ICD-10-CM | POA: Diagnosis not present

## 2017-05-06 DIAGNOSIS — Z23 Encounter for immunization: Secondary | ICD-10-CM | POA: Diagnosis not present

## 2017-05-06 DIAGNOSIS — N186 End stage renal disease: Secondary | ICD-10-CM | POA: Diagnosis not present

## 2017-05-07 DIAGNOSIS — Z23 Encounter for immunization: Secondary | ICD-10-CM | POA: Diagnosis not present

## 2017-05-07 DIAGNOSIS — Z992 Dependence on renal dialysis: Secondary | ICD-10-CM | POA: Diagnosis not present

## 2017-05-07 DIAGNOSIS — N186 End stage renal disease: Secondary | ICD-10-CM | POA: Diagnosis not present

## 2017-05-08 DIAGNOSIS — Z23 Encounter for immunization: Secondary | ICD-10-CM | POA: Diagnosis not present

## 2017-05-08 DIAGNOSIS — N186 End stage renal disease: Secondary | ICD-10-CM | POA: Diagnosis not present

## 2017-05-08 DIAGNOSIS — Z992 Dependence on renal dialysis: Secondary | ICD-10-CM | POA: Diagnosis not present

## 2017-05-09 DIAGNOSIS — Z992 Dependence on renal dialysis: Secondary | ICD-10-CM | POA: Diagnosis not present

## 2017-05-09 DIAGNOSIS — Z23 Encounter for immunization: Secondary | ICD-10-CM | POA: Diagnosis not present

## 2017-05-09 DIAGNOSIS — N186 End stage renal disease: Secondary | ICD-10-CM | POA: Diagnosis not present

## 2017-05-10 DIAGNOSIS — Z23 Encounter for immunization: Secondary | ICD-10-CM | POA: Diagnosis not present

## 2017-05-10 DIAGNOSIS — N186 End stage renal disease: Secondary | ICD-10-CM | POA: Diagnosis not present

## 2017-05-10 DIAGNOSIS — Z992 Dependence on renal dialysis: Secondary | ICD-10-CM | POA: Diagnosis not present

## 2017-05-11 DIAGNOSIS — Z992 Dependence on renal dialysis: Secondary | ICD-10-CM | POA: Diagnosis not present

## 2017-05-11 DIAGNOSIS — N186 End stage renal disease: Secondary | ICD-10-CM | POA: Diagnosis not present

## 2017-05-11 DIAGNOSIS — Z23 Encounter for immunization: Secondary | ICD-10-CM | POA: Diagnosis not present

## 2017-05-12 DIAGNOSIS — Z992 Dependence on renal dialysis: Secondary | ICD-10-CM | POA: Diagnosis not present

## 2017-05-12 DIAGNOSIS — N186 End stage renal disease: Secondary | ICD-10-CM | POA: Diagnosis not present

## 2017-05-12 DIAGNOSIS — Z23 Encounter for immunization: Secondary | ICD-10-CM | POA: Diagnosis not present

## 2017-05-13 DIAGNOSIS — Z992 Dependence on renal dialysis: Secondary | ICD-10-CM | POA: Diagnosis not present

## 2017-05-13 DIAGNOSIS — N186 End stage renal disease: Secondary | ICD-10-CM | POA: Diagnosis not present

## 2017-05-13 DIAGNOSIS — Z23 Encounter for immunization: Secondary | ICD-10-CM | POA: Diagnosis not present

## 2017-05-14 DIAGNOSIS — Z992 Dependence on renal dialysis: Secondary | ICD-10-CM | POA: Diagnosis not present

## 2017-05-14 DIAGNOSIS — N186 End stage renal disease: Secondary | ICD-10-CM | POA: Diagnosis not present

## 2017-05-14 DIAGNOSIS — Z23 Encounter for immunization: Secondary | ICD-10-CM | POA: Diagnosis not present

## 2017-05-15 DIAGNOSIS — Z992 Dependence on renal dialysis: Secondary | ICD-10-CM | POA: Diagnosis not present

## 2017-05-15 DIAGNOSIS — Z23 Encounter for immunization: Secondary | ICD-10-CM | POA: Diagnosis not present

## 2017-05-15 DIAGNOSIS — N186 End stage renal disease: Secondary | ICD-10-CM | POA: Diagnosis not present

## 2017-05-16 DIAGNOSIS — Z992 Dependence on renal dialysis: Secondary | ICD-10-CM | POA: Diagnosis not present

## 2017-05-16 DIAGNOSIS — Z23 Encounter for immunization: Secondary | ICD-10-CM | POA: Diagnosis not present

## 2017-05-16 DIAGNOSIS — N186 End stage renal disease: Secondary | ICD-10-CM | POA: Diagnosis not present

## 2017-05-17 DIAGNOSIS — Z992 Dependence on renal dialysis: Secondary | ICD-10-CM | POA: Diagnosis not present

## 2017-05-17 DIAGNOSIS — Z23 Encounter for immunization: Secondary | ICD-10-CM | POA: Diagnosis not present

## 2017-05-17 DIAGNOSIS — N186 End stage renal disease: Secondary | ICD-10-CM | POA: Diagnosis not present

## 2017-05-18 DIAGNOSIS — Z23 Encounter for immunization: Secondary | ICD-10-CM | POA: Diagnosis not present

## 2017-05-18 DIAGNOSIS — N186 End stage renal disease: Secondary | ICD-10-CM | POA: Diagnosis not present

## 2017-05-18 DIAGNOSIS — Z992 Dependence on renal dialysis: Secondary | ICD-10-CM | POA: Diagnosis not present

## 2017-05-19 DIAGNOSIS — Z23 Encounter for immunization: Secondary | ICD-10-CM | POA: Diagnosis not present

## 2017-05-19 DIAGNOSIS — Z992 Dependence on renal dialysis: Secondary | ICD-10-CM | POA: Diagnosis not present

## 2017-05-19 DIAGNOSIS — N186 End stage renal disease: Secondary | ICD-10-CM | POA: Diagnosis not present

## 2017-05-20 DIAGNOSIS — N186 End stage renal disease: Secondary | ICD-10-CM | POA: Diagnosis not present

## 2017-05-20 DIAGNOSIS — Z23 Encounter for immunization: Secondary | ICD-10-CM | POA: Diagnosis not present

## 2017-05-20 DIAGNOSIS — Z992 Dependence on renal dialysis: Secondary | ICD-10-CM | POA: Diagnosis not present

## 2017-05-21 DIAGNOSIS — N186 End stage renal disease: Secondary | ICD-10-CM | POA: Diagnosis not present

## 2017-05-21 DIAGNOSIS — Z992 Dependence on renal dialysis: Secondary | ICD-10-CM | POA: Diagnosis not present

## 2017-05-21 DIAGNOSIS — Z23 Encounter for immunization: Secondary | ICD-10-CM | POA: Diagnosis not present

## 2017-05-22 DIAGNOSIS — Z23 Encounter for immunization: Secondary | ICD-10-CM | POA: Diagnosis not present

## 2017-05-22 DIAGNOSIS — Z992 Dependence on renal dialysis: Secondary | ICD-10-CM | POA: Diagnosis not present

## 2017-05-22 DIAGNOSIS — N186 End stage renal disease: Secondary | ICD-10-CM | POA: Diagnosis not present

## 2017-05-23 DIAGNOSIS — N186 End stage renal disease: Secondary | ICD-10-CM | POA: Diagnosis not present

## 2017-05-23 DIAGNOSIS — Z992 Dependence on renal dialysis: Secondary | ICD-10-CM | POA: Diagnosis not present

## 2017-05-23 DIAGNOSIS — Z23 Encounter for immunization: Secondary | ICD-10-CM | POA: Diagnosis not present

## 2017-05-24 DIAGNOSIS — R531 Weakness: Secondary | ICD-10-CM | POA: Diagnosis not present

## 2017-05-24 DIAGNOSIS — Z992 Dependence on renal dialysis: Secondary | ICD-10-CM | POA: Diagnosis not present

## 2017-05-24 DIAGNOSIS — N186 End stage renal disease: Secondary | ICD-10-CM | POA: Diagnosis not present

## 2017-05-24 DIAGNOSIS — Z79899 Other long term (current) drug therapy: Secondary | ICD-10-CM | POA: Diagnosis not present

## 2017-05-24 DIAGNOSIS — I517 Cardiomegaly: Secondary | ICD-10-CM | POA: Diagnosis not present

## 2017-05-24 DIAGNOSIS — R918 Other nonspecific abnormal finding of lung field: Secondary | ICD-10-CM | POA: Diagnosis not present

## 2017-05-24 DIAGNOSIS — Z5181 Encounter for therapeutic drug level monitoring: Secondary | ICD-10-CM | POA: Diagnosis not present

## 2017-05-24 DIAGNOSIS — R0602 Shortness of breath: Secondary | ICD-10-CM | POA: Diagnosis not present

## 2017-05-24 DIAGNOSIS — R9431 Abnormal electrocardiogram [ECG] [EKG]: Secondary | ICD-10-CM | POA: Diagnosis not present

## 2017-05-25 DIAGNOSIS — N186 End stage renal disease: Secondary | ICD-10-CM | POA: Diagnosis not present

## 2017-05-25 DIAGNOSIS — Z992 Dependence on renal dialysis: Secondary | ICD-10-CM | POA: Diagnosis not present

## 2017-05-26 DIAGNOSIS — N186 End stage renal disease: Secondary | ICD-10-CM | POA: Diagnosis not present

## 2017-05-26 DIAGNOSIS — Z992 Dependence on renal dialysis: Secondary | ICD-10-CM | POA: Diagnosis not present

## 2017-05-27 DIAGNOSIS — Z992 Dependence on renal dialysis: Secondary | ICD-10-CM | POA: Diagnosis not present

## 2017-05-27 DIAGNOSIS — N186 End stage renal disease: Secondary | ICD-10-CM | POA: Diagnosis not present

## 2017-05-28 DIAGNOSIS — Z992 Dependence on renal dialysis: Secondary | ICD-10-CM | POA: Diagnosis not present

## 2017-05-28 DIAGNOSIS — N186 End stage renal disease: Secondary | ICD-10-CM | POA: Diagnosis not present

## 2017-05-29 DIAGNOSIS — N185 Chronic kidney disease, stage 5: Secondary | ICD-10-CM | POA: Diagnosis not present

## 2017-05-29 DIAGNOSIS — E1122 Type 2 diabetes mellitus with diabetic chronic kidney disease: Secondary | ICD-10-CM | POA: Diagnosis not present

## 2017-05-29 DIAGNOSIS — N186 End stage renal disease: Secondary | ICD-10-CM | POA: Diagnosis not present

## 2017-05-29 DIAGNOSIS — I5041 Acute combined systolic (congestive) and diastolic (congestive) heart failure: Secondary | ICD-10-CM | POA: Diagnosis not present

## 2017-05-29 DIAGNOSIS — K922 Gastrointestinal hemorrhage, unspecified: Secondary | ICD-10-CM | POA: Diagnosis not present

## 2017-05-29 DIAGNOSIS — I10 Essential (primary) hypertension: Secondary | ICD-10-CM | POA: Diagnosis not present

## 2017-05-29 DIAGNOSIS — I2510 Atherosclerotic heart disease of native coronary artery without angina pectoris: Secondary | ICD-10-CM | POA: Diagnosis not present

## 2017-05-29 DIAGNOSIS — T82898D Other specified complication of vascular prosthetic devices, implants and grafts, subsequent encounter: Secondary | ICD-10-CM | POA: Diagnosis not present

## 2017-05-29 DIAGNOSIS — Z992 Dependence on renal dialysis: Secondary | ICD-10-CM | POA: Diagnosis not present

## 2017-05-29 DIAGNOSIS — I771 Stricture of artery: Secondary | ICD-10-CM | POA: Diagnosis not present

## 2017-05-30 DIAGNOSIS — Z992 Dependence on renal dialysis: Secondary | ICD-10-CM | POA: Diagnosis not present

## 2017-05-30 DIAGNOSIS — N186 End stage renal disease: Secondary | ICD-10-CM | POA: Diagnosis not present

## 2017-05-31 DIAGNOSIS — Z992 Dependence on renal dialysis: Secondary | ICD-10-CM | POA: Diagnosis not present

## 2017-05-31 DIAGNOSIS — N186 End stage renal disease: Secondary | ICD-10-CM | POA: Diagnosis not present

## 2017-06-01 DIAGNOSIS — N186 End stage renal disease: Secondary | ICD-10-CM | POA: Diagnosis not present

## 2017-06-01 DIAGNOSIS — Z992 Dependence on renal dialysis: Secondary | ICD-10-CM | POA: Diagnosis not present

## 2017-06-02 DIAGNOSIS — N186 End stage renal disease: Secondary | ICD-10-CM | POA: Diagnosis not present

## 2017-06-02 DIAGNOSIS — Z992 Dependence on renal dialysis: Secondary | ICD-10-CM | POA: Diagnosis not present

## 2017-06-03 DIAGNOSIS — Z992 Dependence on renal dialysis: Secondary | ICD-10-CM | POA: Diagnosis not present

## 2017-06-03 DIAGNOSIS — N186 End stage renal disease: Secondary | ICD-10-CM | POA: Diagnosis not present

## 2017-06-04 ENCOUNTER — Encounter (INDEPENDENT_AMBULATORY_CARE_PROVIDER_SITE_OTHER): Payer: Self-pay | Admitting: Vascular Surgery

## 2017-06-04 ENCOUNTER — Ambulatory Visit (INDEPENDENT_AMBULATORY_CARE_PROVIDER_SITE_OTHER): Payer: Medicare HMO | Admitting: Vascular Surgery

## 2017-06-04 VITALS — BP 126/57 | HR 88 | Resp 16 | Ht 60.0 in | Wt 150.0 lb

## 2017-06-04 DIAGNOSIS — I25119 Atherosclerotic heart disease of native coronary artery with unspecified angina pectoris: Secondary | ICD-10-CM

## 2017-06-04 DIAGNOSIS — E1121 Type 2 diabetes mellitus with diabetic nephropathy: Secondary | ICD-10-CM

## 2017-06-04 DIAGNOSIS — N186 End stage renal disease: Secondary | ICD-10-CM | POA: Diagnosis not present

## 2017-06-04 DIAGNOSIS — E782 Mixed hyperlipidemia: Secondary | ICD-10-CM

## 2017-06-04 DIAGNOSIS — Z794 Long term (current) use of insulin: Secondary | ICD-10-CM

## 2017-06-04 DIAGNOSIS — I10 Essential (primary) hypertension: Secondary | ICD-10-CM | POA: Diagnosis not present

## 2017-06-04 DIAGNOSIS — Z992 Dependence on renal dialysis: Secondary | ICD-10-CM

## 2017-06-04 NOTE — Progress Notes (Signed)
MRN : [id number]  Bethany Lee is a 66 y.o. (Mar 18, 1951) female who presents with chief complaint of  Chief Complaint  Patient presents with  . Follow-up    3-77mo follow up  .  History of Present Illness: The patient returns to the office for followup of their dialysis access. She is currently using her PD catheter with good result.The function of the access has been stable. The patient denies increased bleeding time or increased recirculation. Patient denies difficulty with cannulation. The patient denies hand pain or other symptoms consistent with steal phenomena.  No significant arm swelling.  The patient denies redness or swelling at the access site. The patient denies fever or chills at home or while on dialysis.  The patient denies amaurosis fugax or recent TIA symptoms. There are no recent neurological changes noted. The patient denies claudication symptoms or rest pain symptoms. The patient denies history of DVT, PE or superficial thrombophlebitis. The patient denies recent episodes of angina or shortness of breath.        Current Meds  Medication Sig  . albuterol (PROVENTIL HFA;VENTOLIN HFA) 108 (90 Base) MCG/ACT inhaler Inhale 2 puffs into the lungs every 6 (six) hours as needed for wheezing or shortness of breath.  Marland Kitchen amLODipine (NORVASC) 5 MG tablet Take 5 mg by mouth daily.  Marland Kitchen aspirin EC 81 MG tablet Take 1 tablet (81 mg total) by mouth daily. Start taking after NOV 15 (Patient taking differently: Take 81 mg by mouth daily. )  . carvedilol (COREG) 12.5 MG tablet Take 1 tablet (12.5 mg total) by mouth 2 (two) times daily with a meal. (Patient taking differently: Take 25 mg by mouth 2 (two) times daily with a meal. )  . furosemide (LASIX) 80 MG tablet Take 80 mg by mouth 2 (two) times daily.  Marland Kitchen gentamicin cream (GARAMYCIN) 0.1 % Apply 1 application topically daily. (Patient taking differently: Apply 1 application topically daily. Catheter dressing changes)  .  hydrALAZINE (APRESOLINE) 50 MG tablet Take 50 mg by mouth 2 (two) times daily.  . insulin aspart (NOVOLOG FLEXPEN) 100 UNIT/ML FlexPen Inject 4-8 Units into the skin 3 (three) times daily with meals. Sliding scale below 200 take 4 units, 200-250=6 units, >250= 8 units  . Insulin Glargine (TOUJEO SOLOSTAR) 300 UNIT/ML SOPN Inject 20 Units into the skin at bedtime. (Patient taking differently: Inject 24 Units into the skin at bedtime. )  . levothyroxine (SYNTHROID, LEVOTHROID) 75 MCG tablet Take 75 mcg by mouth daily before breakfast.  . lovastatin (MEVACOR) 40 MG tablet Take 40 mg by mouth at bedtime.  . metolazone (ZAROXOLYN) 5 MG tablet Take 5 mg by mouth daily.   . Multiple Vitamins-Minerals (DIALYVITE 800/ULTRA D) TABS Take 1 tablet by mouth daily.  . pantoprazole (PROTONIX) 40 MG tablet Take 1 tablet (40 mg total) by mouth 2 (two) times daily.  . Pyridoxine HCl (VITAMIN B6 PO) Take by mouth.  . sevelamer carbonate (RENVELA) 800 MG tablet Take by mouth.  . traMADol (ULTRAM) 50 MG tablet Take 1 tablet (50 mg total) by mouth every 8 (eight) hours as needed for moderate pain or severe pain.  . vitamin B-12 (CYANOCOBALAMIN) 1000 MCG tablet Take 1,000 mcg by mouth daily.    Past Medical History:  Diagnosis Date  . Anemia   . CHF (congestive heart failure) ([place])   . Chronic kidney disease    peritoneal dialysis  . Complication of anesthesia   . Coronary artery disease   .  Diabetes mellitus without complication ([place])   . Dialysis patient [facility])    Peritoneal dialysis patient  . Dyspnea    doe  . Edema   . History of recent blood transfusion 01/2017  . Hypercholesterolemia   . Hypertension   . Hypothyroidism   . Neuropathy   . PONV (postoperative nausea and vomiting)   . Shingles    October-November 2017  . Tremors of nervous system    intermittent when taking gabapentin    Past Surgical History:  Procedure Laterality Date  . ABDOMINAL HYSTERECTOMY    . CARDIAC CATHETERIZATION      . CORONARY ANGIOPLASTY    . EYE SURGERY    . TUBAL LIGATION      Social History Social History   Tobacco Use  . Smoking status: Former Smoker    Last attempt to quit: 10/09/2008    Years since quitting: 8.6  . Smokeless tobacco: Never Used  Substance Use Topics  . Alcohol use: No  . Drug use: No    Family History History reviewed. No pertinent family history.  Allergies  Allergen Reactions  . Penicillins     Childhood loss of consciousness  Has patient had a PCN reaction causing immediate rash, facial/tongue/throat swelling, SOB or lightheadedness with hypotension: Yes Has patient had a PCN reaction causing severe rash involving mucus membranes or skin necrosis: No Has patient had a PCN reaction that required hospitalization Unknown Has patient had a PCN reaction occurring within the last 10 years: Unknown If all of the above answers are "NO", then may proceed with Cephalosporin use.   . Valacyclovir Nausea Only     REVIEW OF SYSTEMS (Negative unless checked)  Constitutional: [] Weight loss  [] Fever  [] Chills Cardiac: [] Chest pain   [] Chest pressure   [] Palpitations   [] Shortness of breath when laying flat   [] Shortness of breath with exertion. Vascular:  [] Pain in legs with walking   [] Pain in legs at rest  [] History of DVT   [] Phlebitis   [] Swelling in legs   [] Varicose veins   [] Non-healing ulcers Pulmonary:   [] Uses home oxygen   [] Productive cough   [] Hemoptysis   [] Wheeze  [] COPD   [] Asthma Neurologic:  [] Dizziness   [] Seizures   [] History of stroke   [] History of TIA  [] Aphasia   [] Vissual changes   [] Weakness or numbness in arm   [] Weakness or numbness in leg Musculoskeletal:   [] Joint swelling   [] Joint pain   [] Low back pain Hematologic:  [] Easy bruising  [] Easy bleeding   [] Hypercoagulable state   [] Anemic Gastrointestinal:  [] Diarrhea   [] Vomiting  [] Gastroesophageal reflux/heartburn   [] Difficulty swallowing. Genitourinary:  [x] Chronic kidney disease    [] Difficult urination  [] Frequent urination   [] Blood in urine Skin:  [] Rashes   [] Ulcers  Psychological:  [] History of anxiety   []  History of major depression.  Physical Examination  Vitals:   06/04/17 1126  BP: (!) 126/57  Pulse: 88  Resp: 16  Weight: 150 lb (68 kg)  Height: 5' (1.524 m)   Body mass index is 29.29 kg/m. Gen: WD/WN, NAD Head: [place]/AT, No temporalis wasting.  Ear/Nose/Throat: Hearing grossly intact, nares w/o erythema or drainage Eyes: PER, EOMI, sclera nonicteric.  Neck: Supple, no large masses.   Pulmonary:  Good air movement, no audible wheezing bilaterally, no use of accessory muscles.  Cardiac: RRR, no JVD Vascular: left arm brachial cephalic fistula with good thrill good bruit Vessel Right Left  Radial Palpable Palpable  Ulnar Palpable  Palpable  Brachial Palpable Palpable  Carotid Palpable Palpable  Gastrointestinal: Non-distended. No guarding/no peritoneal signs. PD catheter CD&I Musculoskeletal: M/S 5/5 throughout.  No deformity or atrophy.  Neurologic: CN 2-12 intact. Symmetrical.  Speech is fluent. Motor exam as listed above. Psychiatric: Judgment intact, Mood & affect appropriate for pt's clinical situation. Dermatologic: No rashes or ulcers noted.  No changes consistent with cellulitis. Lymph : No lichenification or skin changes of chronic lymphedema.  CBC Lab Results  Component Value Date   WBC 7.9 03/14/2017   HGB 10.5 (L) 03/14/2017   HCT 30.9 (L) 03/14/2017   MCV 90.3 03/14/2017   PLT 149 (L) 03/14/2017    BMET    Component Value Date/Time   NA 134 (L) 02/16/2017 0440   K 4.5 02/16/2017 0440   K 4.8 01/06/2016 1416   CL 94 (L) 02/16/2017 0440   CO2 25 02/16/2017 0440   GLUCOSE 124 (H) 02/16/2017 0440   BUN 86 (H) 02/16/2017 0440   CREATININE 14.22 (H) 02/16/2017 0440   CALCIUM 7.3 (L) 02/16/2017 0440   GFRNONAA 2 (L) 02/16/2017 0440   GFRAA 3 (L) 02/16/2017 0440   CrCl cannot be calculated (Patient's most recent lab result  is older than the maximum 21 days allowed.).  COAG Lab Results  Component Value Date   INR 1.07 02/10/2017   INR 1.03 02/06/2017   INR 0.97 11/27/2016    Radiology No results found.  Assessment/Plan 1. ESRD on dialysis [facility]) Recommend:  The patient is doing well and currently has adequate dialysis access. The patient's dialysis center is not reporting any access issues.  The patient should have a duplex ultrasound of the dialysis access in 6 months.  The patient will follow-up with me in the office after each ultrasound    2. Mixed hyperlipidemia Continue statin as ordered and reviewed, no changes at this time   3. Type 2 diabetes mellitus with diabetic nephropathy, with long-term current use of insulin (HCC) Continue hypoglycemic medications as already ordered, these medications have been reviewed and there are no changes at this time.  Hgb A1C to be monitored as already arranged by primary service   4. Coronary artery disease involving native coronary artery of native heart with angina pectoris ([place]) Continue cardiac and antihypertensive medications as already ordered and reviewed, no changes at this time.  Continue statin as ordered and reviewed, no changes at this time  Nitrates PRN for chest pain   5. Essential hypertension Continue antihypertensive medications as already ordered, these medications have been reviewed and there are no changes at this time.   Hortencia Pilar, MD  06/04/2017 8:43 PM

## 2017-06-05 ENCOUNTER — Ambulatory Visit (INDEPENDENT_AMBULATORY_CARE_PROVIDER_SITE_OTHER): Payer: Medicare HMO

## 2017-06-05 ENCOUNTER — Other Ambulatory Visit (INDEPENDENT_AMBULATORY_CARE_PROVIDER_SITE_OTHER): Payer: Self-pay | Admitting: Vascular Surgery

## 2017-06-05 DIAGNOSIS — N186 End stage renal disease: Secondary | ICD-10-CM | POA: Diagnosis not present

## 2017-06-05 DIAGNOSIS — I739 Peripheral vascular disease, unspecified: Secondary | ICD-10-CM

## 2017-06-05 DIAGNOSIS — Z992 Dependence on renal dialysis: Secondary | ICD-10-CM | POA: Diagnosis not present

## 2017-06-06 DIAGNOSIS — N186 End stage renal disease: Secondary | ICD-10-CM | POA: Diagnosis not present

## 2017-06-06 DIAGNOSIS — Z992 Dependence on renal dialysis: Secondary | ICD-10-CM | POA: Diagnosis not present

## 2017-06-07 DIAGNOSIS — Z992 Dependence on renal dialysis: Secondary | ICD-10-CM | POA: Diagnosis not present

## 2017-06-07 DIAGNOSIS — N186 End stage renal disease: Secondary | ICD-10-CM | POA: Diagnosis not present

## 2017-06-08 DIAGNOSIS — N186 End stage renal disease: Secondary | ICD-10-CM | POA: Diagnosis not present

## 2017-06-08 DIAGNOSIS — Z992 Dependence on renal dialysis: Secondary | ICD-10-CM | POA: Diagnosis not present

## 2017-06-09 DIAGNOSIS — Z992 Dependence on renal dialysis: Secondary | ICD-10-CM | POA: Diagnosis not present

## 2017-06-09 DIAGNOSIS — N186 End stage renal disease: Secondary | ICD-10-CM | POA: Diagnosis not present

## 2017-06-10 DIAGNOSIS — N186 End stage renal disease: Secondary | ICD-10-CM | POA: Diagnosis not present

## 2017-06-10 DIAGNOSIS — Z992 Dependence on renal dialysis: Secondary | ICD-10-CM | POA: Diagnosis not present

## 2017-06-10 IMAGING — CT CT ABD-PELV W/O CM
2 of 4 series · 16 of 46 positions shown, 18 images · non-contrast
Comparison: None.

CLINICAL DATA: Low back pain, pelvic pain, history of peritoneal
dialysis

EXAM:
CT ABDOMEN AND PELVIS WITHOUT CONTRAST
TECHNIQUE: Multidetector CT imaging of the abdomen and pelvis was performed
following the standard protocol without IV contrast.

[Series 2: axial st · axial · 0.79mm/px · z∈[-502,-87]mm · 13 of 91 slices shown, 15 images]
[im 4/91  soft-tissue]
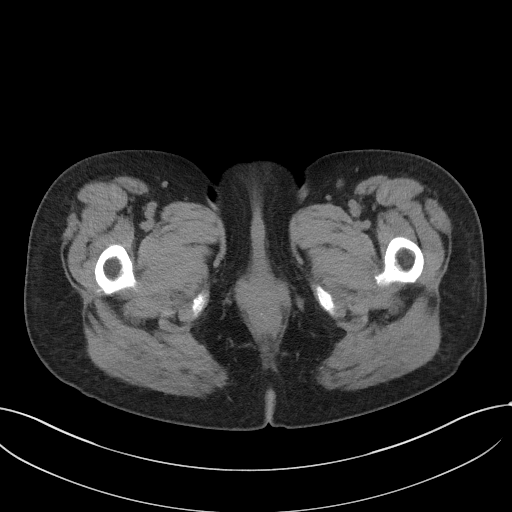
[im 4/91  bone]
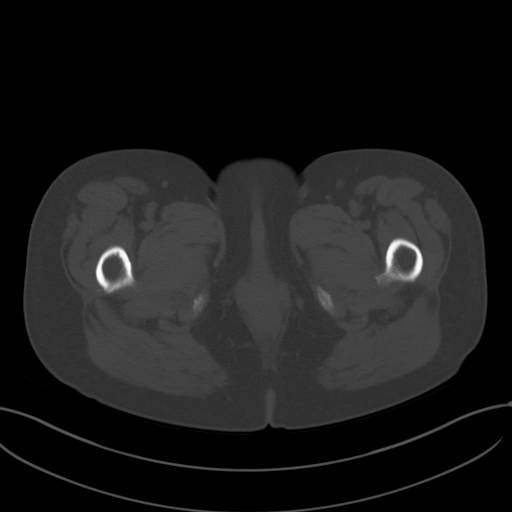
[im 12/91  soft-tissue]
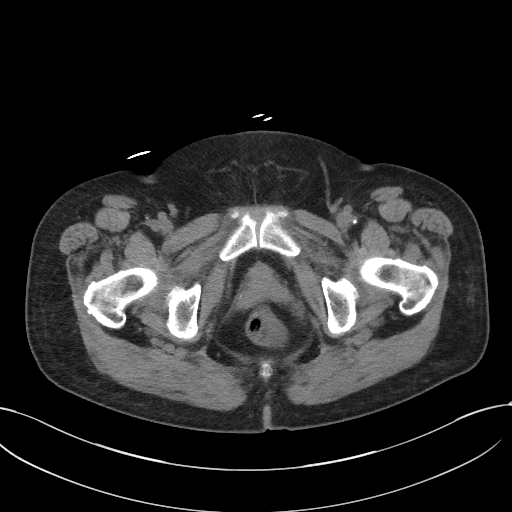
[im 20/91  soft-tissue]
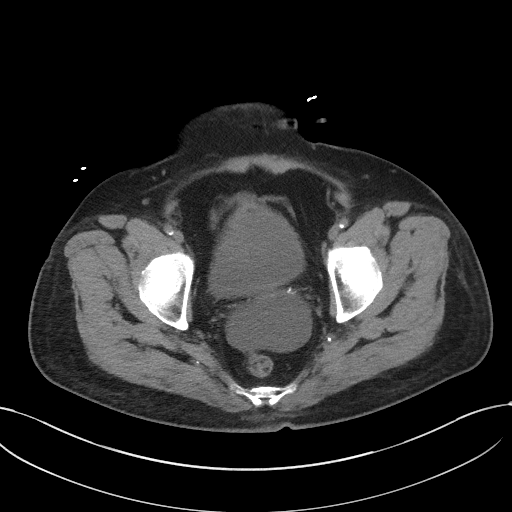
[im 24/91  soft-tissue]
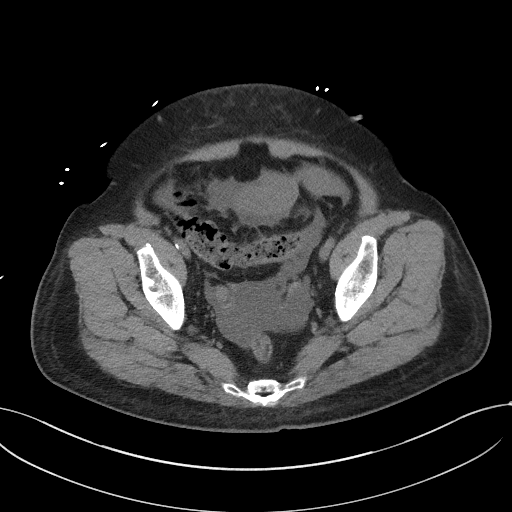
[im 32/91  soft-tissue]
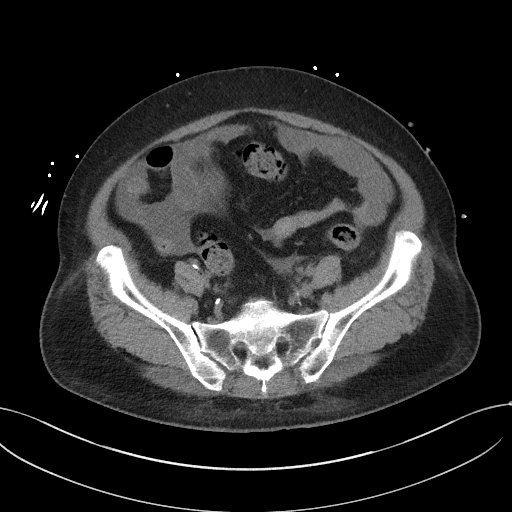
[im 40/91  soft-tissue]
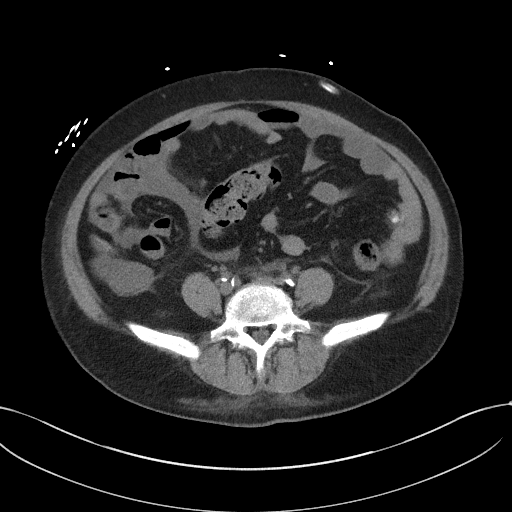
[im 47/91  soft-tissue]
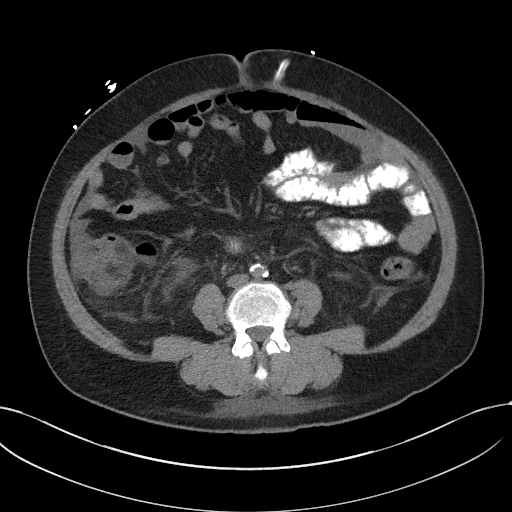
[im 51/91  soft-tissue]
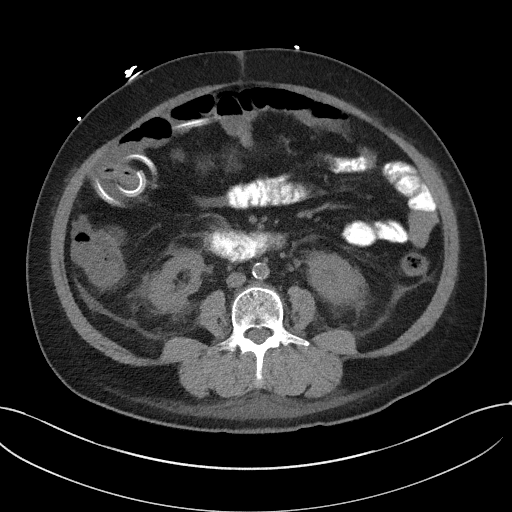
[im 59/91  soft-tissue]
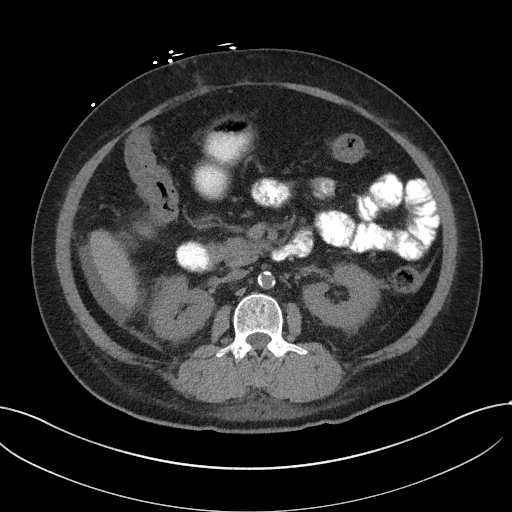
[im 59/91  bone]
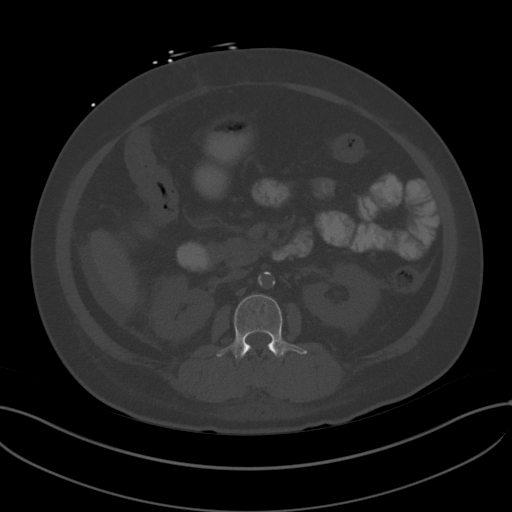
[im 67/91  soft-tissue]
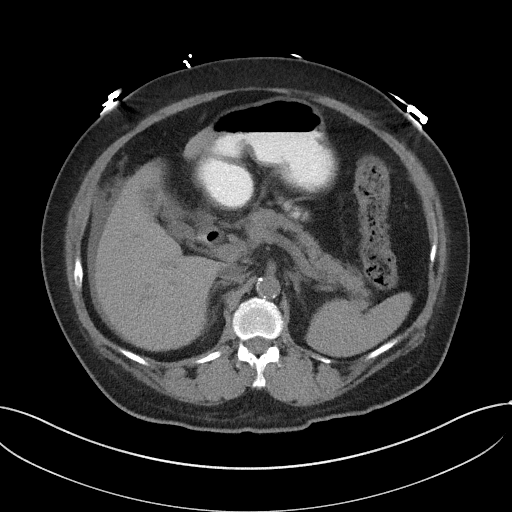
[im 71/91  soft-tissue]
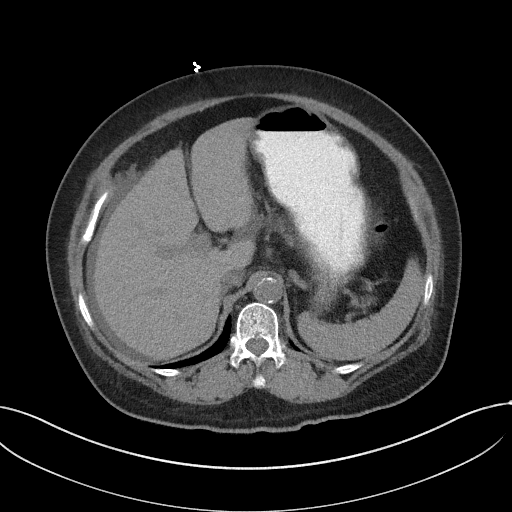
[im 79/91  soft-tissue]
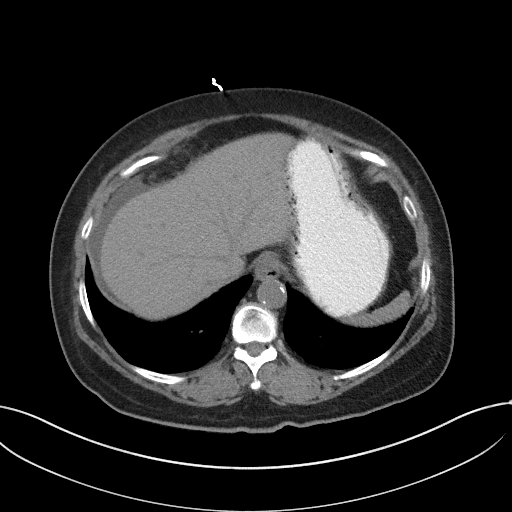
[im 87/91  soft-tissue]
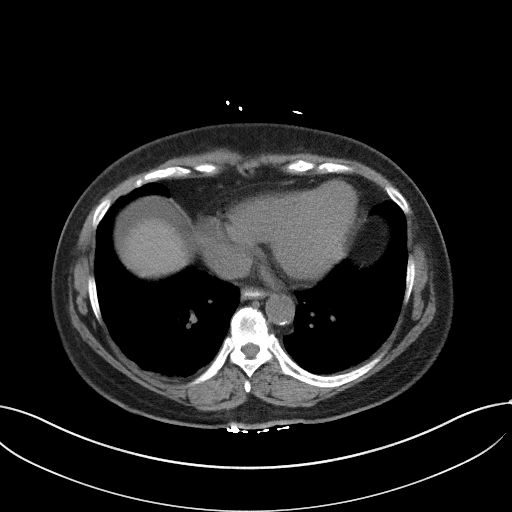

[Series 5: coronal st · coronal · 0.77mm/px · 3 of 98 slices shown]
[im 33/98  soft-tissue]
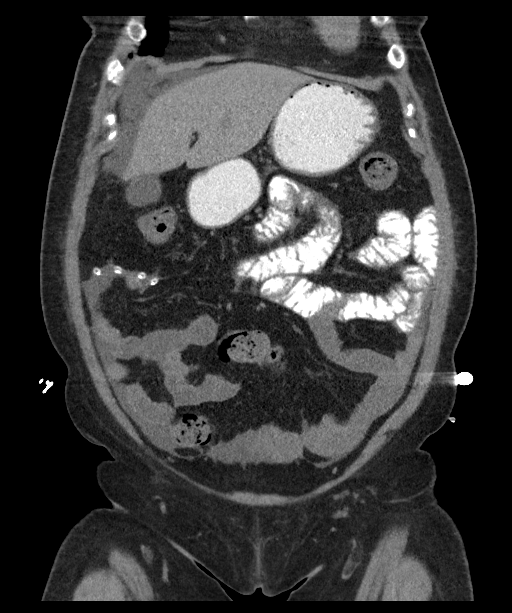
[im 44/98  soft-tissue]
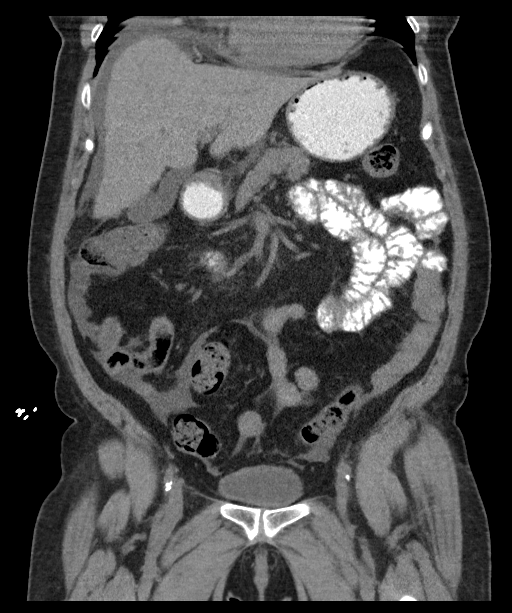
[im 54/98  soft-tissue]
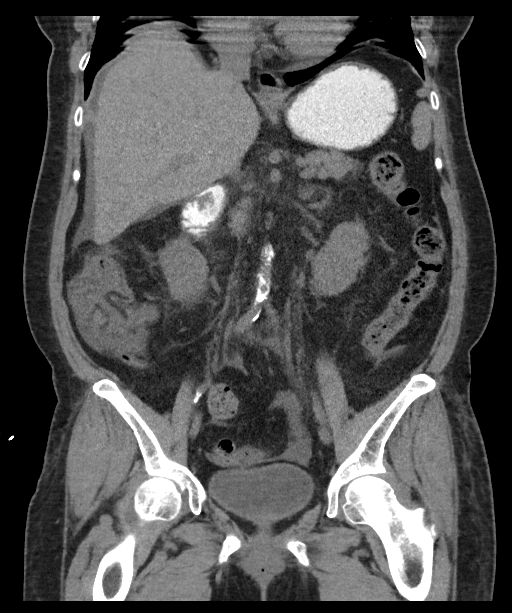

[16 of 46 positions shown; findings below may reference images not displayed]

FINDINGS: Lower chest: The lung bases are unremarkable.  Small hiatal hernia.

Hepatobiliary: Unenhanced liver shows no biliary ductal dilatation.
Small perihepatic ascites.

Pancreas: Unenhanced pancreas is unremarkable.

Spleen: Unenhanced spleen is unremarkable.

Adrenals/Urinary Tract: Unenhanced kidneys shows cortical thinning.
Bilateral nonspecific perinephric fluid. No nephrolithiasis. No
hydronephrosis or hydroureter. No calcified ureteral calculi are
noted.

Stomach/Bowel: Oral contrast material was given to the patient.
Contrast material noted within stomach and proximal small bowels. No
gastric outlet obstruction. No small bowel obstruction. No thickened
or dilated small bowel loops. Some colonic stool noted within
descending colon. Some colonic stool noted within the no sigmoid
colon. No distal colitis or diverticulitis. No pericecal
inflammation. Normal appendix partially visualized in axial image
54.

Vascular/Lymphatic: Atherosclerotic calcifications of abdominal
aorta and iliac arteries. No retroperitoneal or mesenteric
adenopathy.

Reproductive: The patient is status post hysterectomy.

Other: Small ascites is noted in right paracolic gutter. There is
small to moderate pelvic ascites.

The urinary bladder is unremarkable. Bilateral distal ureter is
unremarkable. A peritoneal dialysis catheter is noted in left
periumbilical region with tip in right abdomen.

Musculoskeletal: No destructive bony lesions are noted. Sagittal
images of the spine shows mild degenerative changes lower thoracic
spine.
IMPRESSION: 1. Small hiatal hernia. There is small perihepatic ascites.
Bilateral nonspecific perinephric stranding. No nephrolithiasis.
2. Small ascites noted in right paracolic gutter. Small to moderate
pelvic ascites. No pericecal inflammation. Normal appendix.
Peritoneal dialysis catheter in place.
3. Status post hysterectomy.
4. No small bowel or colonic obstruction. Some colonic stool noted
in distal colon. Redundant sigmoid colon. No distal colitis or
diverticulitis.

## 2017-06-11 ENCOUNTER — Ambulatory Visit
Admission: RE | Admit: 2017-06-11 | Discharge: 2017-06-11 | Attending: Allergy & Immunology | Admitting: Allergy & Immunology

## 2017-06-11 DIAGNOSIS — Z992 Dependence on renal dialysis: Secondary | ICD-10-CM | POA: Diagnosis not present

## 2017-06-11 DIAGNOSIS — R55 Syncope and collapse: Secondary | ICD-10-CM | POA: Diagnosis not present

## 2017-06-11 DIAGNOSIS — N186 End stage renal disease: Secondary | ICD-10-CM | POA: Diagnosis not present

## 2017-06-11 DIAGNOSIS — T50995D Adverse effect of other drugs, medicaments and biological substances, subsequent encounter: Secondary | ICD-10-CM | POA: Diagnosis not present

## 2017-06-11 DIAGNOSIS — Z880 Allergy status to penicillin: Secondary | ICD-10-CM | POA: Diagnosis not present

## 2017-06-12 DIAGNOSIS — Z992 Dependence on renal dialysis: Secondary | ICD-10-CM | POA: Diagnosis not present

## 2017-06-12 DIAGNOSIS — N186 End stage renal disease: Secondary | ICD-10-CM | POA: Diagnosis not present

## 2017-06-13 DIAGNOSIS — N186 End stage renal disease: Secondary | ICD-10-CM | POA: Diagnosis not present

## 2017-06-13 DIAGNOSIS — Z992 Dependence on renal dialysis: Secondary | ICD-10-CM | POA: Diagnosis not present

## 2017-06-14 DIAGNOSIS — N186 End stage renal disease: Secondary | ICD-10-CM | POA: Diagnosis not present

## 2017-06-14 DIAGNOSIS — Z992 Dependence on renal dialysis: Secondary | ICD-10-CM | POA: Diagnosis not present

## 2017-06-15 DIAGNOSIS — N186 End stage renal disease: Secondary | ICD-10-CM | POA: Diagnosis not present

## 2017-06-15 DIAGNOSIS — Z992 Dependence on renal dialysis: Secondary | ICD-10-CM | POA: Diagnosis not present

## 2017-06-16 DIAGNOSIS — Z992 Dependence on renal dialysis: Secondary | ICD-10-CM | POA: Diagnosis not present

## 2017-06-16 DIAGNOSIS — N186 End stage renal disease: Secondary | ICD-10-CM | POA: Diagnosis not present

## 2017-06-17 DIAGNOSIS — N186 End stage renal disease: Secondary | ICD-10-CM | POA: Diagnosis not present

## 2017-06-17 DIAGNOSIS — Z992 Dependence on renal dialysis: Secondary | ICD-10-CM | POA: Diagnosis not present

## 2017-06-18 DIAGNOSIS — N186 End stage renal disease: Secondary | ICD-10-CM | POA: Diagnosis not present

## 2017-06-18 DIAGNOSIS — Z992 Dependence on renal dialysis: Secondary | ICD-10-CM | POA: Diagnosis not present

## 2017-06-19 DIAGNOSIS — N186 End stage renal disease: Secondary | ICD-10-CM | POA: Diagnosis not present

## 2017-06-19 DIAGNOSIS — Z992 Dependence on renal dialysis: Secondary | ICD-10-CM | POA: Diagnosis not present

## 2017-06-20 DIAGNOSIS — Z992 Dependence on renal dialysis: Secondary | ICD-10-CM | POA: Diagnosis not present

## 2017-06-20 DIAGNOSIS — N186 End stage renal disease: Secondary | ICD-10-CM | POA: Diagnosis not present

## 2017-06-21 DIAGNOSIS — Z992 Dependence on renal dialysis: Secondary | ICD-10-CM | POA: Diagnosis not present

## 2017-06-21 DIAGNOSIS — N186 End stage renal disease: Secondary | ICD-10-CM | POA: Diagnosis not present

## 2017-06-22 DIAGNOSIS — Z992 Dependence on renal dialysis: Secondary | ICD-10-CM | POA: Diagnosis not present

## 2017-06-22 DIAGNOSIS — N186 End stage renal disease: Secondary | ICD-10-CM | POA: Diagnosis not present

## 2017-06-23 DIAGNOSIS — N186 End stage renal disease: Secondary | ICD-10-CM | POA: Diagnosis not present

## 2017-06-23 DIAGNOSIS — Z992 Dependence on renal dialysis: Secondary | ICD-10-CM | POA: Diagnosis not present

## 2017-06-24 DIAGNOSIS — N186 End stage renal disease: Secondary | ICD-10-CM | POA: Diagnosis not present

## 2017-06-24 DIAGNOSIS — Z992 Dependence on renal dialysis: Secondary | ICD-10-CM | POA: Diagnosis not present

## 2017-06-25 DIAGNOSIS — N186 End stage renal disease: Secondary | ICD-10-CM | POA: Diagnosis not present

## 2017-06-25 DIAGNOSIS — Z992 Dependence on renal dialysis: Secondary | ICD-10-CM | POA: Diagnosis not present

## 2017-06-26 DIAGNOSIS — N186 End stage renal disease: Secondary | ICD-10-CM | POA: Diagnosis not present

## 2017-06-26 DIAGNOSIS — Z992 Dependence on renal dialysis: Secondary | ICD-10-CM | POA: Diagnosis not present

## 2017-06-27 DIAGNOSIS — Z992 Dependence on renal dialysis: Secondary | ICD-10-CM | POA: Diagnosis not present

## 2017-06-27 DIAGNOSIS — E113412 Type 2 diabetes mellitus with severe nonproliferative diabetic retinopathy with macular edema, left eye: Secondary | ICD-10-CM | POA: Diagnosis not present

## 2017-06-27 DIAGNOSIS — N186 End stage renal disease: Secondary | ICD-10-CM | POA: Diagnosis not present

## 2017-06-28 DIAGNOSIS — Z992 Dependence on renal dialysis: Secondary | ICD-10-CM | POA: Diagnosis not present

## 2017-06-28 DIAGNOSIS — N186 End stage renal disease: Secondary | ICD-10-CM | POA: Diagnosis not present

## 2017-06-29 ENCOUNTER — Ambulatory Visit
Admission: RE | Admit: 2017-06-29 | Discharge: 2017-06-29 | Disposition: A | Discharge status: Home / Self Care | Payer: MEDICARE | Attending: Internal Medicine | Admitting: Internal Medicine

## 2017-06-29 ENCOUNTER — Other Ambulatory Visit: Payer: Self-pay | Admitting: Nurse Practitioner

## 2017-06-29 DIAGNOSIS — Z01818 Encounter for other preprocedural examination: Secondary | ICD-10-CM | POA: Diagnosis not present

## 2017-06-29 DIAGNOSIS — N186 End stage renal disease: Secondary | ICD-10-CM | POA: Diagnosis not present

## 2017-06-29 DIAGNOSIS — Z23 Encounter for immunization: Secondary | ICD-10-CM | POA: Diagnosis not present

## 2017-06-29 DIAGNOSIS — Z7682 Awaiting organ transplant status: Secondary | ICD-10-CM | POA: Diagnosis not present

## 2017-06-29 DIAGNOSIS — Z992 Dependence on renal dialysis: Secondary | ICD-10-CM | POA: Diagnosis not present

## 2017-06-30 DIAGNOSIS — N186 End stage renal disease: Secondary | ICD-10-CM | POA: Diagnosis not present

## 2017-06-30 DIAGNOSIS — Z992 Dependence on renal dialysis: Secondary | ICD-10-CM | POA: Diagnosis not present

## 2017-07-01 DIAGNOSIS — N186 End stage renal disease: Secondary | ICD-10-CM | POA: Diagnosis not present

## 2017-07-01 DIAGNOSIS — Z992 Dependence on renal dialysis: Secondary | ICD-10-CM | POA: Diagnosis not present

## 2017-07-02 DIAGNOSIS — N186 End stage renal disease: Secondary | ICD-10-CM | POA: Diagnosis not present

## 2017-07-02 DIAGNOSIS — Z992 Dependence on renal dialysis: Secondary | ICD-10-CM | POA: Diagnosis not present

## 2017-07-03 DIAGNOSIS — Z992 Dependence on renal dialysis: Secondary | ICD-10-CM | POA: Diagnosis not present

## 2017-07-03 DIAGNOSIS — N186 End stage renal disease: Secondary | ICD-10-CM | POA: Diagnosis not present

## 2017-07-04 DIAGNOSIS — Z992 Dependence on renal dialysis: Secondary | ICD-10-CM | POA: Diagnosis not present

## 2017-07-04 DIAGNOSIS — N186 End stage renal disease: Secondary | ICD-10-CM | POA: Diagnosis not present

## 2017-07-05 DIAGNOSIS — N186 End stage renal disease: Secondary | ICD-10-CM | POA: Diagnosis not present

## 2017-07-05 DIAGNOSIS — Z992 Dependence on renal dialysis: Secondary | ICD-10-CM | POA: Diagnosis not present

## 2017-07-06 DIAGNOSIS — Z992 Dependence on renal dialysis: Secondary | ICD-10-CM | POA: Diagnosis not present

## 2017-07-06 DIAGNOSIS — N186 End stage renal disease: Secondary | ICD-10-CM | POA: Diagnosis not present

## 2017-07-07 DIAGNOSIS — N186 End stage renal disease: Secondary | ICD-10-CM | POA: Diagnosis not present

## 2017-07-07 DIAGNOSIS — Z992 Dependence on renal dialysis: Secondary | ICD-10-CM | POA: Diagnosis not present

## 2017-07-08 DIAGNOSIS — N186 End stage renal disease: Secondary | ICD-10-CM | POA: Diagnosis not present

## 2017-07-08 DIAGNOSIS — Z992 Dependence on renal dialysis: Secondary | ICD-10-CM | POA: Diagnosis not present

## 2017-07-09 DIAGNOSIS — N186 End stage renal disease: Secondary | ICD-10-CM | POA: Diagnosis not present

## 2017-07-09 DIAGNOSIS — Z992 Dependence on renal dialysis: Secondary | ICD-10-CM | POA: Diagnosis not present

## 2017-07-10 DIAGNOSIS — Z794 Long term (current) use of insulin: Secondary | ICD-10-CM | POA: Diagnosis not present

## 2017-07-10 DIAGNOSIS — L309 Dermatitis, unspecified: Secondary | ICD-10-CM | POA: Diagnosis not present

## 2017-07-10 DIAGNOSIS — E1122 Type 2 diabetes mellitus with diabetic chronic kidney disease: Secondary | ICD-10-CM | POA: Diagnosis not present

## 2017-07-10 DIAGNOSIS — Z9114 Patient's other noncompliance with medication regimen: Secondary | ICD-10-CM | POA: Diagnosis not present

## 2017-07-10 DIAGNOSIS — N186 End stage renal disease: Secondary | ICD-10-CM | POA: Diagnosis not present

## 2017-07-10 DIAGNOSIS — Z992 Dependence on renal dialysis: Secondary | ICD-10-CM | POA: Diagnosis not present

## 2017-07-10 DIAGNOSIS — Z01818 Encounter for other preprocedural examination: Secondary | ICD-10-CM

## 2017-07-10 DIAGNOSIS — Z7682 Awaiting organ transplant status: Principal | ICD-10-CM

## 2017-07-11 DIAGNOSIS — Z992 Dependence on renal dialysis: Secondary | ICD-10-CM | POA: Diagnosis not present

## 2017-07-11 DIAGNOSIS — N186 End stage renal disease: Secondary | ICD-10-CM | POA: Diagnosis not present

## 2017-07-12 DIAGNOSIS — N186 End stage renal disease: Secondary | ICD-10-CM | POA: Diagnosis not present

## 2017-07-12 DIAGNOSIS — Z992 Dependence on renal dialysis: Secondary | ICD-10-CM | POA: Diagnosis not present

## 2017-07-13 DIAGNOSIS — Z992 Dependence on renal dialysis: Secondary | ICD-10-CM | POA: Diagnosis not present

## 2017-07-13 DIAGNOSIS — N186 End stage renal disease: Secondary | ICD-10-CM | POA: Diagnosis not present

## 2017-07-14 DIAGNOSIS — Z992 Dependence on renal dialysis: Secondary | ICD-10-CM | POA: Diagnosis not present

## 2017-07-14 DIAGNOSIS — N186 End stage renal disease: Secondary | ICD-10-CM | POA: Diagnosis not present

## 2017-07-15 DIAGNOSIS — N186 End stage renal disease: Secondary | ICD-10-CM | POA: Diagnosis not present

## 2017-07-15 DIAGNOSIS — Z992 Dependence on renal dialysis: Secondary | ICD-10-CM | POA: Diagnosis not present

## 2017-07-16 DIAGNOSIS — N186 End stage renal disease: Secondary | ICD-10-CM | POA: Diagnosis not present

## 2017-07-16 DIAGNOSIS — Z992 Dependence on renal dialysis: Secondary | ICD-10-CM | POA: Diagnosis not present

## 2017-07-17 DIAGNOSIS — Z992 Dependence on renal dialysis: Secondary | ICD-10-CM | POA: Diagnosis not present

## 2017-07-17 DIAGNOSIS — N186 End stage renal disease: Secondary | ICD-10-CM | POA: Diagnosis not present

## 2017-07-18 DIAGNOSIS — Z992 Dependence on renal dialysis: Secondary | ICD-10-CM | POA: Diagnosis not present

## 2017-07-18 DIAGNOSIS — N186 End stage renal disease: Secondary | ICD-10-CM | POA: Diagnosis not present

## 2017-07-19 DIAGNOSIS — Z992 Dependence on renal dialysis: Secondary | ICD-10-CM | POA: Diagnosis not present

## 2017-07-19 DIAGNOSIS — N186 End stage renal disease: Secondary | ICD-10-CM | POA: Diagnosis not present

## 2017-07-20 DIAGNOSIS — N186 End stage renal disease: Secondary | ICD-10-CM | POA: Diagnosis not present

## 2017-07-20 DIAGNOSIS — Z992 Dependence on renal dialysis: Secondary | ICD-10-CM | POA: Diagnosis not present

## 2017-07-21 DIAGNOSIS — Z992 Dependence on renal dialysis: Secondary | ICD-10-CM | POA: Diagnosis not present

## 2017-07-21 DIAGNOSIS — N186 End stage renal disease: Secondary | ICD-10-CM | POA: Diagnosis not present

## 2017-07-22 DIAGNOSIS — Z992 Dependence on renal dialysis: Secondary | ICD-10-CM | POA: Diagnosis not present

## 2017-07-22 DIAGNOSIS — N186 End stage renal disease: Secondary | ICD-10-CM | POA: Diagnosis not present

## 2017-07-23 DIAGNOSIS — Z992 Dependence on renal dialysis: Secondary | ICD-10-CM | POA: Diagnosis not present

## 2017-07-23 DIAGNOSIS — N186 End stage renal disease: Secondary | ICD-10-CM | POA: Diagnosis not present

## 2017-07-24 DIAGNOSIS — N186 End stage renal disease: Secondary | ICD-10-CM | POA: Diagnosis not present

## 2017-07-24 DIAGNOSIS — Z992 Dependence on renal dialysis: Secondary | ICD-10-CM | POA: Diagnosis not present

## 2017-07-25 DIAGNOSIS — Z992 Dependence on renal dialysis: Secondary | ICD-10-CM | POA: Diagnosis not present

## 2017-07-25 DIAGNOSIS — N186 End stage renal disease: Secondary | ICD-10-CM | POA: Diagnosis not present

## 2017-07-26 DIAGNOSIS — N186 End stage renal disease: Secondary | ICD-10-CM | POA: Diagnosis not present

## 2017-07-26 DIAGNOSIS — Z992 Dependence on renal dialysis: Secondary | ICD-10-CM | POA: Diagnosis not present

## 2017-07-27 DIAGNOSIS — Z992 Dependence on renal dialysis: Secondary | ICD-10-CM | POA: Diagnosis not present

## 2017-07-27 DIAGNOSIS — N186 End stage renal disease: Secondary | ICD-10-CM | POA: Diagnosis not present

## 2017-07-28 DIAGNOSIS — Z992 Dependence on renal dialysis: Secondary | ICD-10-CM | POA: Diagnosis not present

## 2017-07-28 DIAGNOSIS — N186 End stage renal disease: Secondary | ICD-10-CM | POA: Diagnosis not present

## 2017-07-29 DIAGNOSIS — Z992 Dependence on renal dialysis: Secondary | ICD-10-CM | POA: Diagnosis not present

## 2017-07-29 DIAGNOSIS — N186 End stage renal disease: Secondary | ICD-10-CM | POA: Diagnosis not present

## 2017-07-30 DIAGNOSIS — D638 Anemia in other chronic diseases classified elsewhere: Secondary | ICD-10-CM | POA: Diagnosis not present

## 2017-07-30 DIAGNOSIS — I10 Essential (primary) hypertension: Secondary | ICD-10-CM | POA: Diagnosis not present

## 2017-07-30 DIAGNOSIS — E1122 Type 2 diabetes mellitus with diabetic chronic kidney disease: Secondary | ICD-10-CM | POA: Diagnosis not present

## 2017-07-30 DIAGNOSIS — E119 Type 2 diabetes mellitus without complications: Secondary | ICD-10-CM | POA: Diagnosis not present

## 2017-07-30 DIAGNOSIS — N186 End stage renal disease: Secondary | ICD-10-CM | POA: Diagnosis not present

## 2017-07-30 DIAGNOSIS — Z992 Dependence on renal dialysis: Secondary | ICD-10-CM | POA: Diagnosis not present

## 2017-07-30 DIAGNOSIS — Z794 Long term (current) use of insulin: Secondary | ICD-10-CM | POA: Diagnosis not present

## 2017-07-30 DIAGNOSIS — E039 Hypothyroidism, unspecified: Secondary | ICD-10-CM | POA: Diagnosis not present

## 2017-07-31 ENCOUNTER — Encounter: Admit: 2017-07-31 | Discharge: 2017-07-31 | Payer: MEDICARE | Attending: Internal Medicine | Primary: Internal Medicine

## 2017-07-31 DIAGNOSIS — Z01818 Encounter for other preprocedural examination: Secondary | ICD-10-CM | POA: Diagnosis not present

## 2017-07-31 DIAGNOSIS — Z992 Dependence on renal dialysis: Secondary | ICD-10-CM | POA: Diagnosis not present

## 2017-07-31 DIAGNOSIS — N186 End stage renal disease: Secondary | ICD-10-CM | POA: Diagnosis not present

## 2017-07-31 DIAGNOSIS — Z7682 Awaiting organ transplant status: Secondary | ICD-10-CM | POA: Diagnosis not present

## 2017-08-01 DIAGNOSIS — I10 Essential (primary) hypertension: Secondary | ICD-10-CM | POA: Diagnosis not present

## 2017-08-01 DIAGNOSIS — E782 Mixed hyperlipidemia: Secondary | ICD-10-CM | POA: Diagnosis not present

## 2017-08-01 DIAGNOSIS — I2510 Atherosclerotic heart disease of native coronary artery without angina pectoris: Secondary | ICD-10-CM | POA: Diagnosis not present

## 2017-08-01 DIAGNOSIS — Z992 Dependence on renal dialysis: Secondary | ICD-10-CM | POA: Diagnosis not present

## 2017-08-01 DIAGNOSIS — D649 Anemia, unspecified: Secondary | ICD-10-CM | POA: Diagnosis not present

## 2017-08-01 DIAGNOSIS — E1121 Type 2 diabetes mellitus with diabetic nephropathy: Secondary | ICD-10-CM | POA: Diagnosis not present

## 2017-08-01 DIAGNOSIS — N185 Chronic kidney disease, stage 5: Secondary | ICD-10-CM | POA: Diagnosis not present

## 2017-08-01 DIAGNOSIS — I5040 Unspecified combined systolic (congestive) and diastolic (congestive) heart failure: Secondary | ICD-10-CM | POA: Diagnosis not present

## 2017-08-01 DIAGNOSIS — Z794 Long term (current) use of insulin: Secondary | ICD-10-CM | POA: Diagnosis not present

## 2017-08-01 DIAGNOSIS — E113412 Type 2 diabetes mellitus with severe nonproliferative diabetic retinopathy with macular edema, left eye: Secondary | ICD-10-CM | POA: Diagnosis not present

## 2017-08-01 DIAGNOSIS — N186 End stage renal disease: Secondary | ICD-10-CM | POA: Diagnosis not present

## 2017-08-02 DIAGNOSIS — Z992 Dependence on renal dialysis: Secondary | ICD-10-CM | POA: Diagnosis not present

## 2017-08-02 DIAGNOSIS — N186 End stage renal disease: Secondary | ICD-10-CM | POA: Diagnosis not present

## 2017-08-03 DIAGNOSIS — N186 End stage renal disease: Secondary | ICD-10-CM | POA: Diagnosis not present

## 2017-08-03 DIAGNOSIS — Z992 Dependence on renal dialysis: Secondary | ICD-10-CM | POA: Diagnosis not present

## 2017-08-04 DIAGNOSIS — N186 End stage renal disease: Secondary | ICD-10-CM | POA: Diagnosis not present

## 2017-08-04 DIAGNOSIS — Z992 Dependence on renal dialysis: Secondary | ICD-10-CM | POA: Diagnosis not present

## 2017-08-05 DIAGNOSIS — Z992 Dependence on renal dialysis: Secondary | ICD-10-CM | POA: Diagnosis not present

## 2017-08-05 DIAGNOSIS — N186 End stage renal disease: Secondary | ICD-10-CM | POA: Diagnosis not present

## 2017-08-06 DIAGNOSIS — Z992 Dependence on renal dialysis: Secondary | ICD-10-CM | POA: Diagnosis not present

## 2017-08-06 DIAGNOSIS — N186 End stage renal disease: Secondary | ICD-10-CM | POA: Diagnosis not present

## 2017-08-07 DIAGNOSIS — Z79899 Other long term (current) drug therapy: Secondary | ICD-10-CM | POA: Diagnosis not present

## 2017-08-07 DIAGNOSIS — Z992 Dependence on renal dialysis: Secondary | ICD-10-CM | POA: Diagnosis not present

## 2017-08-07 DIAGNOSIS — N186 End stage renal disease: Secondary | ICD-10-CM | POA: Diagnosis not present

## 2017-08-07 DIAGNOSIS — E785 Hyperlipidemia, unspecified: Secondary | ICD-10-CM | POA: Diagnosis not present

## 2017-08-07 DIAGNOSIS — Z7682 Awaiting organ transplant status: Principal | ICD-10-CM

## 2017-08-07 DIAGNOSIS — Z01818 Encounter for other preprocedural examination: Secondary | ICD-10-CM

## 2017-08-08 DIAGNOSIS — N186 End stage renal disease: Secondary | ICD-10-CM | POA: Diagnosis not present

## 2017-08-08 DIAGNOSIS — Z992 Dependence on renal dialysis: Secondary | ICD-10-CM | POA: Diagnosis not present

## 2017-08-09 DIAGNOSIS — Z992 Dependence on renal dialysis: Secondary | ICD-10-CM | POA: Diagnosis not present

## 2017-08-09 DIAGNOSIS — N186 End stage renal disease: Secondary | ICD-10-CM | POA: Diagnosis not present

## 2017-08-10 DIAGNOSIS — N186 End stage renal disease: Secondary | ICD-10-CM | POA: Diagnosis not present

## 2017-08-10 DIAGNOSIS — Z992 Dependence on renal dialysis: Secondary | ICD-10-CM | POA: Diagnosis not present

## 2017-08-11 DIAGNOSIS — Z992 Dependence on renal dialysis: Secondary | ICD-10-CM | POA: Diagnosis not present

## 2017-08-11 DIAGNOSIS — N186 End stage renal disease: Secondary | ICD-10-CM | POA: Diagnosis not present

## 2017-08-12 DIAGNOSIS — N186 End stage renal disease: Secondary | ICD-10-CM | POA: Diagnosis not present

## 2017-08-12 DIAGNOSIS — Z992 Dependence on renal dialysis: Secondary | ICD-10-CM | POA: Diagnosis not present

## 2017-08-13 DIAGNOSIS — Z992 Dependence on renal dialysis: Secondary | ICD-10-CM | POA: Diagnosis not present

## 2017-08-13 DIAGNOSIS — M8588 Other specified disorders of bone density and structure, other site: Secondary | ICD-10-CM | POA: Diagnosis not present

## 2017-08-13 DIAGNOSIS — N186 End stage renal disease: Secondary | ICD-10-CM | POA: Diagnosis not present

## 2017-08-13 DIAGNOSIS — Z780 Asymptomatic menopausal state: Secondary | ICD-10-CM | POA: Diagnosis not present

## 2017-08-14 DIAGNOSIS — Z992 Dependence on renal dialysis: Secondary | ICD-10-CM | POA: Diagnosis not present

## 2017-08-14 DIAGNOSIS — N186 End stage renal disease: Secondary | ICD-10-CM | POA: Diagnosis not present

## 2017-08-15 DIAGNOSIS — Z992 Dependence on renal dialysis: Secondary | ICD-10-CM | POA: Diagnosis not present

## 2017-08-15 DIAGNOSIS — N186 End stage renal disease: Secondary | ICD-10-CM | POA: Diagnosis not present

## 2017-08-16 DIAGNOSIS — Z992 Dependence on renal dialysis: Secondary | ICD-10-CM | POA: Diagnosis not present

## 2017-08-16 DIAGNOSIS — N186 End stage renal disease: Secondary | ICD-10-CM | POA: Diagnosis not present

## 2017-08-17 DIAGNOSIS — N186 End stage renal disease: Secondary | ICD-10-CM | POA: Diagnosis not present

## 2017-08-17 DIAGNOSIS — Z992 Dependence on renal dialysis: Secondary | ICD-10-CM | POA: Diagnosis not present

## 2017-08-18 DIAGNOSIS — Z992 Dependence on renal dialysis: Secondary | ICD-10-CM | POA: Diagnosis not present

## 2017-08-18 DIAGNOSIS — N186 End stage renal disease: Secondary | ICD-10-CM | POA: Diagnosis not present

## 2017-08-19 DIAGNOSIS — Z992 Dependence on renal dialysis: Secondary | ICD-10-CM | POA: Diagnosis not present

## 2017-08-19 DIAGNOSIS — N186 End stage renal disease: Secondary | ICD-10-CM | POA: Diagnosis not present

## 2017-08-20 DIAGNOSIS — Z992 Dependence on renal dialysis: Secondary | ICD-10-CM | POA: Diagnosis not present

## 2017-08-20 DIAGNOSIS — N186 End stage renal disease: Secondary | ICD-10-CM | POA: Diagnosis not present

## 2017-08-21 DIAGNOSIS — Z992 Dependence on renal dialysis: Secondary | ICD-10-CM | POA: Diagnosis not present

## 2017-08-21 DIAGNOSIS — N186 End stage renal disease: Secondary | ICD-10-CM | POA: Diagnosis not present

## 2017-08-22 DIAGNOSIS — N186 End stage renal disease: Secondary | ICD-10-CM | POA: Diagnosis not present

## 2017-08-22 DIAGNOSIS — Z992 Dependence on renal dialysis: Secondary | ICD-10-CM | POA: Diagnosis not present

## 2017-08-23 DIAGNOSIS — N186 End stage renal disease: Secondary | ICD-10-CM | POA: Diagnosis not present

## 2017-08-23 DIAGNOSIS — Z992 Dependence on renal dialysis: Secondary | ICD-10-CM | POA: Diagnosis not present

## 2017-08-24 DIAGNOSIS — Z992 Dependence on renal dialysis: Secondary | ICD-10-CM | POA: Diagnosis not present

## 2017-08-24 DIAGNOSIS — N186 End stage renal disease: Secondary | ICD-10-CM | POA: Diagnosis not present

## 2017-08-25 DIAGNOSIS — Z992 Dependence on renal dialysis: Secondary | ICD-10-CM | POA: Diagnosis not present

## 2017-08-25 DIAGNOSIS — N186 End stage renal disease: Secondary | ICD-10-CM | POA: Diagnosis not present

## 2017-08-26 DIAGNOSIS — N186 End stage renal disease: Secondary | ICD-10-CM | POA: Diagnosis not present

## 2017-08-26 DIAGNOSIS — Z992 Dependence on renal dialysis: Secondary | ICD-10-CM | POA: Diagnosis not present

## 2017-08-27 DIAGNOSIS — N186 End stage renal disease: Secondary | ICD-10-CM | POA: Diagnosis not present

## 2017-08-27 DIAGNOSIS — Z992 Dependence on renal dialysis: Secondary | ICD-10-CM | POA: Diagnosis not present

## 2017-08-28 DIAGNOSIS — Z992 Dependence on renal dialysis: Secondary | ICD-10-CM | POA: Diagnosis not present

## 2017-08-28 DIAGNOSIS — N186 End stage renal disease: Secondary | ICD-10-CM | POA: Diagnosis not present

## 2017-08-29 DIAGNOSIS — Z992 Dependence on renal dialysis: Secondary | ICD-10-CM | POA: Diagnosis not present

## 2017-08-29 DIAGNOSIS — N186 End stage renal disease: Secondary | ICD-10-CM | POA: Diagnosis not present

## 2017-08-30 DIAGNOSIS — N186 End stage renal disease: Secondary | ICD-10-CM | POA: Diagnosis not present

## 2017-08-30 DIAGNOSIS — Z992 Dependence on renal dialysis: Secondary | ICD-10-CM | POA: Diagnosis not present

## 2017-08-31 DIAGNOSIS — Z992 Dependence on renal dialysis: Secondary | ICD-10-CM | POA: Diagnosis not present

## 2017-08-31 DIAGNOSIS — N186 End stage renal disease: Secondary | ICD-10-CM | POA: Diagnosis not present

## 2017-09-01 DIAGNOSIS — N186 End stage renal disease: Secondary | ICD-10-CM | POA: Diagnosis not present

## 2017-09-01 DIAGNOSIS — Z992 Dependence on renal dialysis: Secondary | ICD-10-CM | POA: Diagnosis not present

## 2017-09-02 DIAGNOSIS — Z992 Dependence on renal dialysis: Secondary | ICD-10-CM | POA: Diagnosis not present

## 2017-09-02 DIAGNOSIS — N186 End stage renal disease: Secondary | ICD-10-CM | POA: Diagnosis not present

## 2017-09-03 DIAGNOSIS — N186 End stage renal disease: Secondary | ICD-10-CM | POA: Diagnosis not present

## 2017-09-03 DIAGNOSIS — Z992 Dependence on renal dialysis: Secondary | ICD-10-CM | POA: Diagnosis not present

## 2017-09-04 DIAGNOSIS — N186 End stage renal disease: Secondary | ICD-10-CM | POA: Diagnosis not present

## 2017-09-04 DIAGNOSIS — Z992 Dependence on renal dialysis: Secondary | ICD-10-CM | POA: Diagnosis not present

## 2017-09-05 DIAGNOSIS — Z992 Dependence on renal dialysis: Secondary | ICD-10-CM | POA: Diagnosis not present

## 2017-09-05 DIAGNOSIS — N186 End stage renal disease: Secondary | ICD-10-CM | POA: Diagnosis not present

## 2017-09-06 DIAGNOSIS — N186 End stage renal disease: Secondary | ICD-10-CM | POA: Diagnosis not present

## 2017-09-06 DIAGNOSIS — Z992 Dependence on renal dialysis: Secondary | ICD-10-CM | POA: Diagnosis not present

## 2017-09-07 DIAGNOSIS — N186 End stage renal disease: Secondary | ICD-10-CM | POA: Diagnosis not present

## 2017-09-07 DIAGNOSIS — Z992 Dependence on renal dialysis: Secondary | ICD-10-CM | POA: Diagnosis not present

## 2017-09-08 DIAGNOSIS — N186 End stage renal disease: Secondary | ICD-10-CM | POA: Diagnosis not present

## 2017-09-08 DIAGNOSIS — Z992 Dependence on renal dialysis: Secondary | ICD-10-CM | POA: Diagnosis not present

## 2017-09-09 DIAGNOSIS — Z992 Dependence on renal dialysis: Secondary | ICD-10-CM | POA: Diagnosis not present

## 2017-09-09 DIAGNOSIS — N186 End stage renal disease: Secondary | ICD-10-CM | POA: Diagnosis not present

## 2017-09-10 DIAGNOSIS — N186 End stage renal disease: Secondary | ICD-10-CM | POA: Diagnosis not present

## 2017-09-10 DIAGNOSIS — Z992 Dependence on renal dialysis: Secondary | ICD-10-CM | POA: Diagnosis not present

## 2017-09-11 DIAGNOSIS — Z992 Dependence on renal dialysis: Secondary | ICD-10-CM | POA: Diagnosis not present

## 2017-09-11 DIAGNOSIS — N186 End stage renal disease: Secondary | ICD-10-CM | POA: Diagnosis not present

## 2017-09-12 DIAGNOSIS — B351 Tinea unguium: Secondary | ICD-10-CM | POA: Diagnosis not present

## 2017-09-12 DIAGNOSIS — E113412 Type 2 diabetes mellitus with severe nonproliferative diabetic retinopathy with macular edema, left eye: Secondary | ICD-10-CM | POA: Diagnosis not present

## 2017-09-12 DIAGNOSIS — N186 End stage renal disease: Secondary | ICD-10-CM | POA: Diagnosis not present

## 2017-09-12 DIAGNOSIS — L851 Acquired keratosis [keratoderma] palmaris et plantaris: Secondary | ICD-10-CM | POA: Diagnosis not present

## 2017-09-12 DIAGNOSIS — Z794 Long term (current) use of insulin: Secondary | ICD-10-CM | POA: Diagnosis not present

## 2017-09-12 DIAGNOSIS — H2512 Age-related nuclear cataract, left eye: Secondary | ICD-10-CM | POA: Diagnosis not present

## 2017-09-12 DIAGNOSIS — E1140 Type 2 diabetes mellitus with diabetic neuropathy, unspecified: Secondary | ICD-10-CM | POA: Diagnosis not present

## 2017-09-12 DIAGNOSIS — Z992 Dependence on renal dialysis: Secondary | ICD-10-CM | POA: Diagnosis not present

## 2017-09-12 DIAGNOSIS — Z7682 Awaiting organ transplant status: Principal | ICD-10-CM

## 2017-09-12 DIAGNOSIS — Z01818 Encounter for other preprocedural examination: Secondary | ICD-10-CM

## 2017-09-13 DIAGNOSIS — Z992 Dependence on renal dialysis: Secondary | ICD-10-CM | POA: Diagnosis not present

## 2017-09-13 DIAGNOSIS — N186 End stage renal disease: Secondary | ICD-10-CM | POA: Diagnosis not present

## 2017-09-14 DIAGNOSIS — N186 End stage renal disease: Secondary | ICD-10-CM | POA: Diagnosis not present

## 2017-09-14 DIAGNOSIS — Z992 Dependence on renal dialysis: Secondary | ICD-10-CM | POA: Diagnosis not present

## 2017-09-15 DIAGNOSIS — N186 End stage renal disease: Secondary | ICD-10-CM | POA: Diagnosis not present

## 2017-09-15 DIAGNOSIS — Z992 Dependence on renal dialysis: Secondary | ICD-10-CM | POA: Diagnosis not present

## 2017-09-16 DIAGNOSIS — N186 End stage renal disease: Secondary | ICD-10-CM | POA: Diagnosis not present

## 2017-09-16 DIAGNOSIS — Z992 Dependence on renal dialysis: Secondary | ICD-10-CM | POA: Diagnosis not present

## 2017-09-17 DIAGNOSIS — N186 End stage renal disease: Secondary | ICD-10-CM | POA: Diagnosis not present

## 2017-09-17 DIAGNOSIS — Z992 Dependence on renal dialysis: Secondary | ICD-10-CM | POA: Diagnosis not present

## 2017-09-18 DIAGNOSIS — N186 End stage renal disease: Secondary | ICD-10-CM | POA: Diagnosis not present

## 2017-09-18 DIAGNOSIS — Z992 Dependence on renal dialysis: Secondary | ICD-10-CM | POA: Diagnosis not present

## 2017-09-19 DIAGNOSIS — N186 End stage renal disease: Secondary | ICD-10-CM | POA: Diagnosis not present

## 2017-09-19 DIAGNOSIS — Z992 Dependence on renal dialysis: Secondary | ICD-10-CM | POA: Diagnosis not present

## 2017-09-20 DIAGNOSIS — Z992 Dependence on renal dialysis: Secondary | ICD-10-CM | POA: Diagnosis not present

## 2017-09-20 DIAGNOSIS — N186 End stage renal disease: Secondary | ICD-10-CM | POA: Diagnosis not present

## 2017-09-21 DIAGNOSIS — N186 End stage renal disease: Secondary | ICD-10-CM | POA: Diagnosis not present

## 2017-09-21 DIAGNOSIS — Z992 Dependence on renal dialysis: Secondary | ICD-10-CM | POA: Diagnosis not present

## 2017-09-22 DIAGNOSIS — N186 End stage renal disease: Secondary | ICD-10-CM | POA: Diagnosis not present

## 2017-09-22 DIAGNOSIS — Z992 Dependence on renal dialysis: Secondary | ICD-10-CM | POA: Diagnosis not present

## 2017-09-23 DIAGNOSIS — N186 End stage renal disease: Secondary | ICD-10-CM | POA: Diagnosis not present

## 2017-09-23 DIAGNOSIS — Z992 Dependence on renal dialysis: Secondary | ICD-10-CM | POA: Diagnosis not present

## 2017-09-24 DIAGNOSIS — N186 End stage renal disease: Secondary | ICD-10-CM | POA: Diagnosis not present

## 2017-09-24 DIAGNOSIS — Z992 Dependence on renal dialysis: Secondary | ICD-10-CM | POA: Diagnosis not present

## 2017-09-25 DIAGNOSIS — N186 End stage renal disease: Secondary | ICD-10-CM | POA: Diagnosis not present

## 2017-09-25 DIAGNOSIS — Z992 Dependence on renal dialysis: Secondary | ICD-10-CM | POA: Diagnosis not present

## 2017-09-26 DIAGNOSIS — Z992 Dependence on renal dialysis: Secondary | ICD-10-CM | POA: Diagnosis not present

## 2017-09-26 DIAGNOSIS — N186 End stage renal disease: Secondary | ICD-10-CM | POA: Diagnosis not present

## 2017-09-27 DIAGNOSIS — Z992 Dependence on renal dialysis: Secondary | ICD-10-CM | POA: Diagnosis not present

## 2017-09-27 DIAGNOSIS — N186 End stage renal disease: Secondary | ICD-10-CM | POA: Diagnosis not present

## 2017-09-28 DIAGNOSIS — T82898D Other specified complication of vascular prosthetic devices, implants and grafts, subsequent encounter: Secondary | ICD-10-CM | POA: Diagnosis not present

## 2017-09-28 DIAGNOSIS — K922 Gastrointestinal hemorrhage, unspecified: Secondary | ICD-10-CM | POA: Diagnosis not present

## 2017-09-28 DIAGNOSIS — N186 End stage renal disease: Secondary | ICD-10-CM | POA: Diagnosis not present

## 2017-09-28 DIAGNOSIS — I5041 Acute combined systolic (congestive) and diastolic (congestive) heart failure: Secondary | ICD-10-CM | POA: Diagnosis not present

## 2017-09-28 DIAGNOSIS — E1142 Type 2 diabetes mellitus with diabetic polyneuropathy: Secondary | ICD-10-CM | POA: Diagnosis not present

## 2017-09-28 DIAGNOSIS — I2510 Atherosclerotic heart disease of native coronary artery without angina pectoris: Secondary | ICD-10-CM | POA: Diagnosis not present

## 2017-09-28 DIAGNOSIS — Z955 Presence of coronary angioplasty implant and graft: Secondary | ICD-10-CM | POA: Diagnosis not present

## 2017-09-28 DIAGNOSIS — Z992 Dependence on renal dialysis: Secondary | ICD-10-CM | POA: Diagnosis not present

## 2017-09-28 DIAGNOSIS — I771 Stricture of artery: Secondary | ICD-10-CM | POA: Diagnosis not present

## 2017-09-28 DIAGNOSIS — N185 Chronic kidney disease, stage 5: Secondary | ICD-10-CM | POA: Diagnosis not present

## 2017-09-29 DIAGNOSIS — N186 End stage renal disease: Secondary | ICD-10-CM | POA: Diagnosis not present

## 2017-09-29 DIAGNOSIS — Z992 Dependence on renal dialysis: Secondary | ICD-10-CM | POA: Diagnosis not present

## 2017-09-30 DIAGNOSIS — N186 End stage renal disease: Secondary | ICD-10-CM | POA: Diagnosis not present

## 2017-09-30 DIAGNOSIS — Z992 Dependence on renal dialysis: Secondary | ICD-10-CM | POA: Diagnosis not present

## 2017-10-01 DIAGNOSIS — Z992 Dependence on renal dialysis: Secondary | ICD-10-CM | POA: Diagnosis not present

## 2017-10-01 DIAGNOSIS — N186 End stage renal disease: Secondary | ICD-10-CM | POA: Diagnosis not present

## 2017-10-02 DIAGNOSIS — Z992 Dependence on renal dialysis: Secondary | ICD-10-CM | POA: Diagnosis not present

## 2017-10-02 DIAGNOSIS — N186 End stage renal disease: Secondary | ICD-10-CM | POA: Diagnosis not present

## 2017-10-03 DIAGNOSIS — Z992 Dependence on renal dialysis: Secondary | ICD-10-CM | POA: Diagnosis not present

## 2017-10-03 DIAGNOSIS — N186 End stage renal disease: Secondary | ICD-10-CM | POA: Diagnosis not present

## 2017-10-04 DIAGNOSIS — N186 End stage renal disease: Secondary | ICD-10-CM | POA: Diagnosis not present

## 2017-10-04 DIAGNOSIS — Z992 Dependence on renal dialysis: Secondary | ICD-10-CM | POA: Diagnosis not present

## 2017-10-05 DIAGNOSIS — N186 End stage renal disease: Secondary | ICD-10-CM | POA: Diagnosis not present

## 2017-10-05 DIAGNOSIS — Z992 Dependence on renal dialysis: Secondary | ICD-10-CM | POA: Diagnosis not present

## 2017-10-06 DIAGNOSIS — N186 End stage renal disease: Secondary | ICD-10-CM | POA: Diagnosis not present

## 2017-10-06 DIAGNOSIS — Z992 Dependence on renal dialysis: Secondary | ICD-10-CM | POA: Diagnosis not present

## 2017-10-07 DIAGNOSIS — N186 End stage renal disease: Secondary | ICD-10-CM | POA: Diagnosis not present

## 2017-10-07 DIAGNOSIS — Z992 Dependence on renal dialysis: Secondary | ICD-10-CM | POA: Diagnosis not present

## 2017-10-08 DIAGNOSIS — N186 End stage renal disease: Secondary | ICD-10-CM | POA: Diagnosis not present

## 2017-10-08 DIAGNOSIS — Z992 Dependence on renal dialysis: Secondary | ICD-10-CM | POA: Diagnosis not present

## 2017-10-09 DIAGNOSIS — N186 End stage renal disease: Secondary | ICD-10-CM | POA: Diagnosis not present

## 2017-10-09 DIAGNOSIS — Z992 Dependence on renal dialysis: Secondary | ICD-10-CM | POA: Diagnosis not present

## 2017-10-10 DIAGNOSIS — N186 End stage renal disease: Secondary | ICD-10-CM | POA: Diagnosis not present

## 2017-10-10 DIAGNOSIS — Z992 Dependence on renal dialysis: Secondary | ICD-10-CM | POA: Diagnosis not present

## 2017-10-11 DIAGNOSIS — N186 End stage renal disease: Secondary | ICD-10-CM | POA: Diagnosis not present

## 2017-10-11 DIAGNOSIS — Z992 Dependence on renal dialysis: Secondary | ICD-10-CM | POA: Diagnosis not present

## 2017-10-12 DIAGNOSIS — Z992 Dependence on renal dialysis: Secondary | ICD-10-CM | POA: Diagnosis not present

## 2017-10-12 DIAGNOSIS — N186 End stage renal disease: Secondary | ICD-10-CM | POA: Diagnosis not present

## 2017-10-13 DIAGNOSIS — Z992 Dependence on renal dialysis: Secondary | ICD-10-CM | POA: Diagnosis not present

## 2017-10-13 DIAGNOSIS — N186 End stage renal disease: Secondary | ICD-10-CM | POA: Diagnosis not present

## 2017-10-14 DIAGNOSIS — Z992 Dependence on renal dialysis: Secondary | ICD-10-CM | POA: Diagnosis not present

## 2017-10-14 DIAGNOSIS — N186 End stage renal disease: Secondary | ICD-10-CM | POA: Diagnosis not present

## 2017-10-15 DIAGNOSIS — Z992 Dependence on renal dialysis: Secondary | ICD-10-CM | POA: Diagnosis not present

## 2017-10-15 DIAGNOSIS — N186 End stage renal disease: Secondary | ICD-10-CM | POA: Diagnosis not present

## 2017-10-16 DIAGNOSIS — N186 End stage renal disease: Secondary | ICD-10-CM | POA: Diagnosis not present

## 2017-10-16 DIAGNOSIS — Z992 Dependence on renal dialysis: Secondary | ICD-10-CM | POA: Diagnosis not present

## 2017-10-17 DIAGNOSIS — N186 End stage renal disease: Secondary | ICD-10-CM | POA: Diagnosis not present

## 2017-10-17 DIAGNOSIS — Z992 Dependence on renal dialysis: Secondary | ICD-10-CM | POA: Diagnosis not present

## 2017-10-18 DIAGNOSIS — Z992 Dependence on renal dialysis: Secondary | ICD-10-CM | POA: Diagnosis not present

## 2017-10-18 DIAGNOSIS — N186 End stage renal disease: Secondary | ICD-10-CM | POA: Diagnosis not present

## 2017-10-19 DIAGNOSIS — Z992 Dependence on renal dialysis: Secondary | ICD-10-CM | POA: Diagnosis not present

## 2017-10-19 DIAGNOSIS — N186 End stage renal disease: Secondary | ICD-10-CM | POA: Diagnosis not present

## 2017-10-20 DIAGNOSIS — Z992 Dependence on renal dialysis: Secondary | ICD-10-CM | POA: Diagnosis not present

## 2017-10-20 DIAGNOSIS — N186 End stage renal disease: Secondary | ICD-10-CM | POA: Diagnosis not present

## 2017-10-21 DIAGNOSIS — N186 End stage renal disease: Secondary | ICD-10-CM | POA: Diagnosis not present

## 2017-10-21 DIAGNOSIS — Z992 Dependence on renal dialysis: Secondary | ICD-10-CM | POA: Diagnosis not present

## 2017-10-22 DIAGNOSIS — Z992 Dependence on renal dialysis: Secondary | ICD-10-CM | POA: Diagnosis not present

## 2017-10-22 DIAGNOSIS — N186 End stage renal disease: Secondary | ICD-10-CM | POA: Diagnosis not present

## 2017-10-23 DIAGNOSIS — N186 End stage renal disease: Secondary | ICD-10-CM | POA: Diagnosis not present

## 2017-10-23 DIAGNOSIS — Z992 Dependence on renal dialysis: Secondary | ICD-10-CM | POA: Diagnosis not present

## 2017-10-24 DIAGNOSIS — Z992 Dependence on renal dialysis: Secondary | ICD-10-CM | POA: Diagnosis not present

## 2017-10-24 DIAGNOSIS — N186 End stage renal disease: Secondary | ICD-10-CM | POA: Diagnosis not present

## 2017-10-25 DIAGNOSIS — N186 End stage renal disease: Secondary | ICD-10-CM | POA: Diagnosis not present

## 2017-10-25 DIAGNOSIS — Z992 Dependence on renal dialysis: Secondary | ICD-10-CM | POA: Diagnosis not present

## 2017-10-26 ENCOUNTER — Encounter: Admit: 2017-10-26 | Discharge: 2017-10-26 | Payer: MEDICARE | Attending: Internal Medicine | Primary: Internal Medicine

## 2017-10-26 DIAGNOSIS — Z794 Long term (current) use of insulin: Secondary | ICD-10-CM | POA: Diagnosis not present

## 2017-10-26 DIAGNOSIS — N186 End stage renal disease: Secondary | ICD-10-CM | POA: Diagnosis not present

## 2017-10-26 DIAGNOSIS — Z992 Dependence on renal dialysis: Secondary | ICD-10-CM | POA: Diagnosis not present

## 2017-10-26 DIAGNOSIS — E785 Hyperlipidemia, unspecified: Secondary | ICD-10-CM | POA: Diagnosis not present

## 2017-10-26 DIAGNOSIS — E119 Type 2 diabetes mellitus without complications: Secondary | ICD-10-CM | POA: Diagnosis not present

## 2017-10-26 DIAGNOSIS — Z7682 Awaiting organ transplant status: Secondary | ICD-10-CM | POA: Diagnosis not present

## 2017-10-26 DIAGNOSIS — Z01818 Encounter for other preprocedural examination: Secondary | ICD-10-CM | POA: Diagnosis not present

## 2017-10-27 DIAGNOSIS — Z992 Dependence on renal dialysis: Secondary | ICD-10-CM | POA: Diagnosis not present

## 2017-10-27 DIAGNOSIS — N186 End stage renal disease: Secondary | ICD-10-CM | POA: Diagnosis not present

## 2017-10-28 DIAGNOSIS — N186 End stage renal disease: Secondary | ICD-10-CM | POA: Diagnosis not present

## 2017-10-28 DIAGNOSIS — Z992 Dependence on renal dialysis: Secondary | ICD-10-CM | POA: Diagnosis not present

## 2017-10-29 DIAGNOSIS — Z992 Dependence on renal dialysis: Secondary | ICD-10-CM | POA: Diagnosis not present

## 2017-10-29 DIAGNOSIS — N186 End stage renal disease: Secondary | ICD-10-CM | POA: Diagnosis not present

## 2017-10-30 DIAGNOSIS — N186 End stage renal disease: Secondary | ICD-10-CM | POA: Diagnosis not present

## 2017-10-30 DIAGNOSIS — Z992 Dependence on renal dialysis: Secondary | ICD-10-CM | POA: Diagnosis not present

## 2017-10-31 DIAGNOSIS — E113412 Type 2 diabetes mellitus with severe nonproliferative diabetic retinopathy with macular edema, left eye: Secondary | ICD-10-CM | POA: Diagnosis not present

## 2017-10-31 DIAGNOSIS — Z992 Dependence on renal dialysis: Secondary | ICD-10-CM | POA: Diagnosis not present

## 2017-10-31 DIAGNOSIS — N186 End stage renal disease: Secondary | ICD-10-CM | POA: Diagnosis not present

## 2017-11-01 DIAGNOSIS — N186 End stage renal disease: Secondary | ICD-10-CM | POA: Diagnosis not present

## 2017-11-01 DIAGNOSIS — Z992 Dependence on renal dialysis: Secondary | ICD-10-CM | POA: Diagnosis not present

## 2017-11-02 DIAGNOSIS — Z992 Dependence on renal dialysis: Secondary | ICD-10-CM | POA: Diagnosis not present

## 2017-11-02 DIAGNOSIS — N186 End stage renal disease: Secondary | ICD-10-CM | POA: Diagnosis not present

## 2017-11-02 DIAGNOSIS — Z7682 Awaiting organ transplant status: Principal | ICD-10-CM

## 2017-11-02 DIAGNOSIS — Z01818 Encounter for other preprocedural examination: Secondary | ICD-10-CM

## 2017-11-03 DIAGNOSIS — N186 End stage renal disease: Secondary | ICD-10-CM | POA: Diagnosis not present

## 2017-11-03 DIAGNOSIS — Z992 Dependence on renal dialysis: Secondary | ICD-10-CM | POA: Diagnosis not present

## 2017-11-04 DIAGNOSIS — Z992 Dependence on renal dialysis: Secondary | ICD-10-CM | POA: Diagnosis not present

## 2017-11-04 DIAGNOSIS — N186 End stage renal disease: Secondary | ICD-10-CM | POA: Diagnosis not present

## 2017-11-05 DIAGNOSIS — N186 End stage renal disease: Secondary | ICD-10-CM | POA: Diagnosis not present

## 2017-11-05 DIAGNOSIS — Z992 Dependence on renal dialysis: Secondary | ICD-10-CM | POA: Diagnosis not present

## 2017-11-06 DIAGNOSIS — Z992 Dependence on renal dialysis: Secondary | ICD-10-CM | POA: Diagnosis not present

## 2017-11-06 DIAGNOSIS — N186 End stage renal disease: Secondary | ICD-10-CM | POA: Diagnosis not present

## 2017-11-07 DIAGNOSIS — N186 End stage renal disease: Secondary | ICD-10-CM | POA: Diagnosis not present

## 2017-11-07 DIAGNOSIS — Z992 Dependence on renal dialysis: Secondary | ICD-10-CM | POA: Diagnosis not present

## 2017-11-08 DIAGNOSIS — L853 Xerosis cutis: Secondary | ICD-10-CM | POA: Diagnosis not present

## 2017-11-08 DIAGNOSIS — Z992 Dependence on renal dialysis: Secondary | ICD-10-CM | POA: Diagnosis not present

## 2017-11-08 DIAGNOSIS — Z794 Long term (current) use of insulin: Secondary | ICD-10-CM | POA: Diagnosis not present

## 2017-11-08 DIAGNOSIS — N186 End stage renal disease: Secondary | ICD-10-CM | POA: Diagnosis not present

## 2017-11-08 DIAGNOSIS — E1122 Type 2 diabetes mellitus with diabetic chronic kidney disease: Secondary | ICD-10-CM | POA: Diagnosis not present

## 2017-11-08 DIAGNOSIS — E113511 Type 2 diabetes mellitus with proliferative diabetic retinopathy with macular edema, right eye: Secondary | ICD-10-CM | POA: Diagnosis not present

## 2017-11-08 DIAGNOSIS — J069 Acute upper respiratory infection, unspecified: Secondary | ICD-10-CM | POA: Diagnosis not present

## 2017-11-08 DIAGNOSIS — E113412 Type 2 diabetes mellitus with severe nonproliferative diabetic retinopathy with macular edema, left eye: Secondary | ICD-10-CM | POA: Diagnosis not present

## 2017-11-08 DIAGNOSIS — E1142 Type 2 diabetes mellitus with diabetic polyneuropathy: Secondary | ICD-10-CM | POA: Diagnosis not present

## 2017-11-08 DIAGNOSIS — Z9114 Patient's other noncompliance with medication regimen: Secondary | ICD-10-CM | POA: Diagnosis not present

## 2017-11-09 DIAGNOSIS — Z992 Dependence on renal dialysis: Secondary | ICD-10-CM | POA: Diagnosis not present

## 2017-11-09 DIAGNOSIS — N186 End stage renal disease: Secondary | ICD-10-CM | POA: Diagnosis not present

## 2017-11-10 DIAGNOSIS — Z992 Dependence on renal dialysis: Secondary | ICD-10-CM | POA: Diagnosis not present

## 2017-11-10 DIAGNOSIS — N186 End stage renal disease: Secondary | ICD-10-CM | POA: Diagnosis not present

## 2017-11-11 DIAGNOSIS — Z992 Dependence on renal dialysis: Secondary | ICD-10-CM | POA: Diagnosis not present

## 2017-11-11 DIAGNOSIS — N186 End stage renal disease: Secondary | ICD-10-CM | POA: Diagnosis not present

## 2017-11-12 DIAGNOSIS — N186 End stage renal disease: Secondary | ICD-10-CM | POA: Diagnosis not present

## 2017-11-12 DIAGNOSIS — Z992 Dependence on renal dialysis: Secondary | ICD-10-CM | POA: Diagnosis not present

## 2017-11-13 DIAGNOSIS — N186 End stage renal disease: Secondary | ICD-10-CM | POA: Diagnosis not present

## 2017-11-13 DIAGNOSIS — Z992 Dependence on renal dialysis: Secondary | ICD-10-CM | POA: Diagnosis not present

## 2017-11-14 DIAGNOSIS — Z992 Dependence on renal dialysis: Secondary | ICD-10-CM | POA: Diagnosis not present

## 2017-11-14 DIAGNOSIS — N186 End stage renal disease: Secondary | ICD-10-CM | POA: Diagnosis not present

## 2017-11-15 DIAGNOSIS — N186 End stage renal disease: Secondary | ICD-10-CM | POA: Diagnosis not present

## 2017-11-15 DIAGNOSIS — Z992 Dependence on renal dialysis: Secondary | ICD-10-CM | POA: Diagnosis not present

## 2017-11-16 DIAGNOSIS — N186 End stage renal disease: Secondary | ICD-10-CM | POA: Diagnosis not present

## 2017-11-16 DIAGNOSIS — Z992 Dependence on renal dialysis: Secondary | ICD-10-CM | POA: Diagnosis not present

## 2017-11-17 DIAGNOSIS — N186 End stage renal disease: Secondary | ICD-10-CM | POA: Diagnosis not present

## 2017-11-17 DIAGNOSIS — Z992 Dependence on renal dialysis: Secondary | ICD-10-CM | POA: Diagnosis not present

## 2017-11-18 DIAGNOSIS — N186 End stage renal disease: Secondary | ICD-10-CM | POA: Diagnosis not present

## 2017-11-18 DIAGNOSIS — Z992 Dependence on renal dialysis: Secondary | ICD-10-CM | POA: Diagnosis not present

## 2017-11-19 DIAGNOSIS — N186 End stage renal disease: Secondary | ICD-10-CM | POA: Diagnosis not present

## 2017-11-19 DIAGNOSIS — Z992 Dependence on renal dialysis: Secondary | ICD-10-CM | POA: Diagnosis not present

## 2017-11-20 DIAGNOSIS — N186 End stage renal disease: Secondary | ICD-10-CM | POA: Diagnosis not present

## 2017-11-20 DIAGNOSIS — Z992 Dependence on renal dialysis: Secondary | ICD-10-CM | POA: Diagnosis not present

## 2017-11-21 DIAGNOSIS — N186 End stage renal disease: Secondary | ICD-10-CM | POA: Diagnosis not present

## 2017-11-21 DIAGNOSIS — Z992 Dependence on renal dialysis: Secondary | ICD-10-CM | POA: Diagnosis not present

## 2017-11-22 DIAGNOSIS — N186 End stage renal disease: Secondary | ICD-10-CM | POA: Diagnosis not present

## 2017-11-22 DIAGNOSIS — Z992 Dependence on renal dialysis: Secondary | ICD-10-CM | POA: Diagnosis not present

## 2017-11-23 DIAGNOSIS — N186 End stage renal disease: Secondary | ICD-10-CM | POA: Diagnosis not present

## 2017-11-23 DIAGNOSIS — Z992 Dependence on renal dialysis: Secondary | ICD-10-CM | POA: Diagnosis not present

## 2017-11-24 DIAGNOSIS — Z992 Dependence on renal dialysis: Secondary | ICD-10-CM | POA: Diagnosis not present

## 2017-11-24 DIAGNOSIS — N186 End stage renal disease: Secondary | ICD-10-CM | POA: Diagnosis not present

## 2017-11-25 DIAGNOSIS — Z992 Dependence on renal dialysis: Secondary | ICD-10-CM | POA: Diagnosis not present

## 2017-11-25 DIAGNOSIS — N186 End stage renal disease: Secondary | ICD-10-CM | POA: Diagnosis not present

## 2017-11-26 ENCOUNTER — Encounter: Admit: 2017-11-26 | Discharge: 2017-11-26 | Attending: Internal Medicine | Primary: Internal Medicine

## 2017-11-26 DIAGNOSIS — N186 End stage renal disease: Secondary | ICD-10-CM | POA: Diagnosis not present

## 2017-11-26 DIAGNOSIS — Z992 Dependence on renal dialysis: Secondary | ICD-10-CM | POA: Diagnosis not present

## 2017-11-27 DIAGNOSIS — Z992 Dependence on renal dialysis: Secondary | ICD-10-CM | POA: Diagnosis not present

## 2017-11-27 DIAGNOSIS — N186 End stage renal disease: Secondary | ICD-10-CM | POA: Diagnosis not present

## 2017-11-28 ENCOUNTER — Encounter: Payer: Self-pay | Admitting: *Deleted

## 2017-11-28 DIAGNOSIS — Z992 Dependence on renal dialysis: Secondary | ICD-10-CM | POA: Diagnosis not present

## 2017-11-28 DIAGNOSIS — N186 End stage renal disease: Secondary | ICD-10-CM | POA: Diagnosis not present

## 2017-11-28 DIAGNOSIS — H2512 Age-related nuclear cataract, left eye: Secondary | ICD-10-CM | POA: Diagnosis not present

## 2017-11-29 ENCOUNTER — Other Ambulatory Visit (INDEPENDENT_AMBULATORY_CARE_PROVIDER_SITE_OTHER): Payer: Self-pay | Admitting: Vascular Surgery

## 2017-11-29 DIAGNOSIS — N186 End stage renal disease: Secondary | ICD-10-CM | POA: Diagnosis not present

## 2017-11-29 DIAGNOSIS — Z992 Dependence on renal dialysis: Principal | ICD-10-CM

## 2017-11-30 DIAGNOSIS — Z992 Dependence on renal dialysis: Secondary | ICD-10-CM | POA: Diagnosis not present

## 2017-11-30 DIAGNOSIS — N186 End stage renal disease: Secondary | ICD-10-CM | POA: Diagnosis not present

## 2017-12-01 DIAGNOSIS — N186 End stage renal disease: Secondary | ICD-10-CM | POA: Diagnosis not present

## 2017-12-01 DIAGNOSIS — Z992 Dependence on renal dialysis: Secondary | ICD-10-CM | POA: Diagnosis not present

## 2017-12-02 DIAGNOSIS — N186 End stage renal disease: Secondary | ICD-10-CM | POA: Diagnosis not present

## 2017-12-02 DIAGNOSIS — Z992 Dependence on renal dialysis: Secondary | ICD-10-CM | POA: Diagnosis not present

## 2017-12-03 ENCOUNTER — Ambulatory Visit (INDEPENDENT_AMBULATORY_CARE_PROVIDER_SITE_OTHER): Payer: Medicare HMO | Admitting: Vascular Surgery

## 2017-12-03 ENCOUNTER — Ambulatory Visit (INDEPENDENT_AMBULATORY_CARE_PROVIDER_SITE_OTHER): Payer: Medicare HMO

## 2017-12-03 ENCOUNTER — Encounter (INDEPENDENT_AMBULATORY_CARE_PROVIDER_SITE_OTHER): Payer: Self-pay | Admitting: Vascular Surgery

## 2017-12-03 VITALS — BP 139/60 | HR 76 | Resp 16 | Ht 60.0 in | Wt 161.8 lb

## 2017-12-03 DIAGNOSIS — Z992 Dependence on renal dialysis: Secondary | ICD-10-CM | POA: Diagnosis not present

## 2017-12-03 DIAGNOSIS — T829XXA Unspecified complication of cardiac and vascular prosthetic device, implant and graft, initial encounter: Secondary | ICD-10-CM | POA: Insufficient documentation

## 2017-12-03 DIAGNOSIS — N186 End stage renal disease: Secondary | ICD-10-CM

## 2017-12-03 DIAGNOSIS — I771 Stricture of artery: Secondary | ICD-10-CM | POA: Diagnosis not present

## 2017-12-03 DIAGNOSIS — T829XXS Unspecified complication of cardiac and vascular prosthetic device, implant and graft, sequela: Secondary | ICD-10-CM

## 2017-12-03 DIAGNOSIS — Z01818 Encounter for other preprocedural examination: Secondary | ICD-10-CM

## 2017-12-03 DIAGNOSIS — Z7682 Awaiting organ transplant status: Principal | ICD-10-CM

## 2017-12-03 NOTE — Progress Notes (Signed)
MRN : [id number]  Bethany Lee is a 67 y.o. (Jan 06, 1951) female who presents with chief complaint of  Chief Complaint  Patient presents with  . Follow-up    37month HDA  .  History of Present Illness:    The patient returns to the office for followup of their dialysis access. The function of the access has been stable.   She is s/p revision of left arm brachial cephalic fistulawith banding of the AV access for elimination of steal syndrome 02/07/2017. She notes that since this operation her hands stopped hurting her left fingers are moving well and she has normal motor function. She still has some numbness of the forearm very minor per the patient.  The numbness of the third fourth and fifth fingers has resolved.  The patient denies hand pain or other symptoms consistent with steal phenomena.  No significant arm swelling.  She is tolerating PD quite well  The patient denies redness or swelling at the access site. The patient denies fever or chills at home or while on dialysis.  The patient is also here for follow up of her PAD. There have been no interval changes in lower extremity symptoms. No interval shortening of the patient's claudication distance or development of rest pain symptoms. No new ulcers or wounds have occurred since the last visit.  There have been no significant changes to the patient's overall health care.  The patient denies amaurosis fugax or recent TIA symptoms. There are no recent neurological changes noted. The patient denies history of DVT, PE or superficial thrombophlebitis. The patient denies recent episodes of angina or shortness of breath.     Current Meds  Medication Sig  . albuterol (PROVENTIL HFA;VENTOLIN HFA) 108 (90 Base) MCG/ACT inhaler Inhale 2 puffs into the lungs every 6 (six) hours as needed for wheezing or shortness of breath.  Marland Kitchen amLODipine (NORVASC) 5 MG tablet Take 5 mg by mouth daily.  Marland Kitchen aspirin EC 81 MG tablet Take 1 tablet (81  mg total) by mouth daily. Start taking after NOV 15 (Patient taking differently: Take 81 mg by mouth daily. )  . carvedilol (COREG) 12.5 MG tablet Take 1 tablet (12.5 mg total) by mouth 2 (two) times daily with a meal. (Patient taking differently: Take 25 mg by mouth 2 (two) times daily with a meal. )  . furosemide (LASIX) 80 MG tablet Take 80 mg by mouth 2 (two) times daily.  . hydrALAZINE (APRESOLINE) 50 MG tablet Take 50 mg by mouth 2 (two) times daily.  . insulin aspart (NOVOLOG FLEXPEN) 100 UNIT/ML FlexPen Inject 4-8 Units into the skin 3 (three) times daily with meals. Sliding scale below 200 take 4 units, 200-250=6 units, >250= 8 units  . Insulin Glargine (TOUJEO SOLOSTAR) 300 UNIT/ML SOPN Inject 20 Units into the skin at bedtime. (Patient taking differently: Inject 24 Units into the skin at bedtime. )  . levothyroxine (SYNTHROID, LEVOTHROID) 75 MCG tablet Take 75 mcg by mouth daily before breakfast.  . lovastatin (MEVACOR) 40 MG tablet Take 40 mg by mouth at bedtime.  . Multiple Vitamins-Minerals (DIALYVITE 800/ULTRA D) TABS Take 1 tablet by mouth daily.  . pantoprazole (PROTONIX) 40 MG tablet Take 1 tablet (40 mg total) by mouth 2 (two) times daily.  . Vitamin D, Ergocalciferol, (DRISDOL) 50000 units CAPS capsule     Past Medical History:  Diagnosis Date  . Anemia   . CHF (congestive heart failure) ([place])   . Chronic kidney disease  peritoneal dialysis  . Complication of anesthesia   . Coronary artery disease   . Diabetes mellitus without complication ([place])   . Dialysis patient [facility])    Peritoneal dialysis patient  . Dyspnea    doe  . Edema   . History of recent blood transfusion 01/2017  . Hypercholesterolemia   . Hypertension   . Hypothyroidism   . Neuropathy   . PONV (postoperative nausea and vomiting)   . Shingles    October-November 2017  . Steal syndrome as complication of dialysis access ([place])   . Tremors of nervous system    intermittent when taking gabapentin      Past Surgical History:  Procedure Laterality Date  . A/V FISTULAGRAM Left 01/16/2017   Procedure: A/V Fistulagram;  Surgeon: Katha Cabal, MD;  Location: [place] [facility];  Service: Cardiovascular;  Laterality: Left;  . A/V SHUNT INTERVENTION N/A 01/16/2017   Procedure: A/V Shunt Intervention;  Surgeon: Katha Cabal, MD;  Location: [place] [facility];  Service: Cardiovascular;  Laterality: N/A;  . ABDOMINAL HYSTERECTOMY    . AV FISTULA PLACEMENT Left 11/30/2016   Procedure: ARTERIOVENOUS (AV) FISTULA CREATION ( EXPLORE FOR CREATION BRACHIOCEPHALIC);  Surgeon: Algernon Huxley, MD;  Location: [facility] ORS;  Service: Vascular;  Laterality: Left;  . CAPD INSERTION N/A 11/25/2015   Procedure: LAPAROSCOPIC INSERTION CONTINUOUS AMBULATORY PERITONEAL DIALYSIS  (CAPD) CATHETER;  Surgeon: Algernon Huxley, MD;  Location: [facility] ORS;  Service: Vascular;  Laterality: N/A;  . CAPD INSERTION N/A 01/06/2016   Procedure: LAPAROSCOPIC INSERTION CONTINUOUS AMBULATORY PERITONEAL DIALYSIS  (CAPD) CATHETER REVISION ;  Surgeon: Algernon Huxley, MD;  Location: [facility];  Service: Vascular;  Laterality: N/A;  . CAPD INSERTION N/A 10/11/2016   Procedure: LAPAROSCOPIC INSERTION CONTINUOUS AMBULATORY PERITONEAL DIALYSIS  (CAPD) CATHETER ( REVISION );  Surgeon: Algernon Huxley, MD;  Location: [facility]S;  Service: Vascular;  Laterality: N/A;  . CARDIAC CATHETERIZATION    . CATARACT EXTRACTION W/PHACO Right 05/25/2016   Procedure: CATARACT EXTRACTION PHACO AND INTRAOCULAR LENS PLACEMENT (IOC);  Surgeon: Eulogio Bear, MD;  Location: [facility] ORS;  Service: Ophthalmology;  Laterality: Right;  Lot # [id number] H [place]: 00:5.2 AP%: 11.3 CDE: 7.21  . COLONOSCOPY WITH PROPOFOL N/A 02/15/2017   Procedure: COLONOSCOPY WITH PROPOFOL;  Surgeon: [name], MD;  Location: [facility] ENDOSCOPY;  Service: Gastroenterology;  Laterality: N/A;  . CORONARY ANGIOPLASTY     STENT  . CORONARY STENT INTERVENTION N/A 09/07/2016   Procedure: Coronary Stent  Intervention;  Surgeon: Isaias Cowman, MD;  Location: [place] [facility];  Service: Cardiovascular;  Laterality: N/A;  . DIALYSIS/PERMA CATHETER INSERTION N/A 09/21/2016   Procedure: Dialysis/Perma Catheter Insertion;  Surgeon: Algernon Huxley, MD;  Location: [place] [facility];  Service: Cardiovascular;  Laterality: N/A;  . DIALYSIS/PERMA CATHETER REMOVAL Right 11/30/2016   Procedure: DIALYSIS/PERMA CATHETER REMOVAL;  Surgeon: Algernon Huxley, MD;  Location: [facility]S;  Service: Vascular;  Laterality: Right;  . ESOPHAGOGASTRODUODENOSCOPY (EGD) WITH PROPOFOL N/A 02/15/2017   Procedure: ESOPHAGOGASTRODUODENOSCOPY (EGD) WITH PROPOFOL;  Surgeon: [name], MD;  Location: [facility] ENDOSCOPY;  Service: Gastroenterology;  Laterality: N/A;  . EYE SURGERY    . GIVENS CAPSULE STUDY N/A 02/26/2017   Procedure: GIVENS CAPSULE STUDY;  Surgeon: [name], MD;  Location: [facility] ENDOSCOPY;  Service: Gastroenterology;  Laterality: N/A;  . LEFT HEART CATH AND CORONARY ANGIOGRAPHY Left 09/07/2016   Procedure: Left Heart Cath and Coronary Angiography;  Surgeon: Isaias Cowman, MD;  Location: [place] [facility];  Service: Cardiovascular;  Laterality: Left;  . LIGATION OF ARTERIOVENOUS  FISTULA Left 02/07/2017   Procedure: LIGATION OF ARTERIOVENOUS  FISTULA ( BANDING BRACHIAL CEPHALIC );  Surgeon: Katha Cabal, MD;  Location: [facility] ORS;  Service: Vascular;  Laterality: Left;  . TUBAL LIGATION    . UPPER EXTREMITY ANGIOGRAPHY Left 01/19/2017   Procedure: Upper Extremity Angiography;  Surgeon: Katha Cabal, MD;  Location: [place] [facility];  Service: Cardiovascular;  Laterality: Left;    Social History Social History   Tobacco Use  . Smoking status: Former Smoker    Last attempt to quit: 10/09/2008    Years since quitting: 9.1  . Smokeless tobacco: Never Used  Substance Use Topics  . Alcohol use: No  . Drug use: No    Family History No family history on file.  Allergies  Allergen Reactions    . Penicillins     Childhood loss of consciousness  Has patient had a PCN reaction causing immediate rash, facial/tongue/throat swelling, SOB or lightheadedness with hypotension: Yes Has patient had a PCN reaction causing severe rash involving mucus membranes or skin necrosis: No Has patient had a PCN reaction that required hospitalization Unknown Has patient had a PCN reaction occurring within the last 10 years: Unknown If all of the above answers are "NO", then may proceed with Cephalosporin use.   . Valacyclovir Nausea Only     REVIEW OF SYSTEMS (Negative unless checked)  Constitutional: [] Weight loss  [] Fever  [] Chills Cardiac: [] Chest pain   [] Chest pressure   [] Palpitations   [] Shortness of breath when laying flat   [] Shortness of breath with exertion. Vascular:  [x] Pain in legs with walking   [] Pain in legs at rest  [] History of DVT   [] Phlebitis   [] Swelling in legs   [] Varicose veins   [] Non-healing ulcers Pulmonary:   [] Uses home oxygen   [] Productive cough   [] Hemoptysis   [] Wheeze  [] COPD   [] Asthma Neurologic:  [] Dizziness   [] Seizures   [] History of stroke   [] History of TIA  [] Aphasia   [] Vissual changes   [] Weakness or numbness in arm   [] Weakness or numbness in leg Musculoskeletal:   [] Joint swelling   [] Joint pain   [] Low back pain Hematologic:  [] Easy bruising  [] Easy bleeding   [] Hypercoagulable state   [] Anemic Gastrointestinal:  [] Diarrhea   [] Vomiting  [] Gastroesophageal reflux/heartburn   [] Difficulty swallowing. Genitourinary:  [x] Chronic kidney disease   [] Difficult urination  [] Frequent urination   [] Blood in urine Skin:  [] Rashes   [] Ulcers  Psychological:  [] History of anxiety   []  History of major depression.  Physical Examination  Vitals:   12/03/17 1109  BP: 139/60  Pulse: 76  Resp: 16  Weight: 161 lb 12.8 oz (73.4 kg)  Height: 5' (1.524 m)   Body mass index is 31.6 kg/m. Gen: WD/WN, NAD Head: [place]/AT, No temporalis wasting.  Ear/Nose/Throat:  Hearing grossly intact, nares w/o erythema or drainage Eyes: PER, EOMI, sclera nonicteric.  Neck: Supple, no large masses.   Pulmonary:  Good air movement, no audible wheezing bilaterally, no use of accessory muscles.  Cardiac: RRR, no JVD Vascular:  Left AV fistula good thrill good bruit Vessel Right Left  Radial Palpable Palpable  Brachial Palpable Palpable  PT Not Palpable Not Palpable  DP Not Palpable Not Palpable  Gastrointestinal: Non-distended. No guarding/no peritoneal signs.  Musculoskeletal: M/S 5/5 throughout.  No deformity or atrophy.  Neurologic: CN 2-12 intact. Symmetrical.  Speech is fluent. Motor exam as  listed above. Psychiatric: Judgment intact, Mood & affect appropriate for pt's clinical situation. Dermatologic: No rashes or ulcers noted.  No changes consistent with cellulitis. Lymph : No lichenification or skin changes of chronic lymphedema.  CBC Lab Results  Component Value Date   WBC 7.9 03/14/2017   HGB 10.5 (L) 03/14/2017   HCT 30.9 (L) 03/14/2017   MCV 90.3 03/14/2017   PLT 149 (L) 03/14/2017    BMET    Component Value Date/Time   NA 134 (L) 02/16/2017 0440   K 4.5 02/16/2017 0440   K 4.8 01/06/2016 1416   CL 94 (L) 02/16/2017 0440   CO2 25 02/16/2017 0440   GLUCOSE 124 (H) 02/16/2017 0440   BUN 86 (H) 02/16/2017 0440   CREATININE 14.22 (H) 02/16/2017 0440   CALCIUM 7.3 (L) 02/16/2017 0440   GFRNONAA 2 (L) 02/16/2017 0440   GFRAA 3 (L) 02/16/2017 0440   CrCl cannot be calculated (Patient's most recent lab result is older than the maximum 21 days allowed.).  COAG Lab Results  Component Value Date   INR 1.07 02/10/2017   INR 1.03 02/06/2017   INR 0.97 11/27/2016    Radiology No results found.    Assessment/Plan 1. ESRD on dialysis [facility]) Recommend:  The patient is doing well and currently has adequate dialysis access. The patient's dialysis center is not reporting any access issues. Flow pattern is stable when compared to the prior  ultrasound, there is a stricture noted near the arterial anastomosis.  I believe this correlates with the banding.  I discussed with the patient and her husband the volume flow is now approximately 700 whereas prior to the banding it was 1900.  It may be that she will need a fistulogram at the time that she would converting to hemodialysis.  I do not recommend this at this time as she is not using her fistula and she did have severe steal syndrome with neuropathy originally.  Again this thought process was discussed in detail with the patient.  The patient should have a duplex ultrasound of the dialysis access in 12 months.  The patient will follow-up with me in the office after each ultrasound    A total of 30 minutes was spent with this patient and greater than 50% was spent in counseling and coordination of care with the patient.  Discussion included the treatment options for vascular disease including indications for surgery and intervention.  Also discussed is the appropriate timing of treatment.  In addition medical therapy was discussed.  - VAS US DUPLEX DIALYSIS ACCESS (AVF,AVG)  2. Complication from renal dialysis device, sequela See #1  3. Subclavian arterial stenosis (HCC)  Recommend:  The patient has evidence of atherosclerosis of the upper and lower extremities with claudication of the legs.  At this time she denies any arm symptoms.  The patient does not voice lifestyle limiting changes at this point in time.  No invasive studies, angiography or surgery at this time The patient should continue walking and begin a more formal exercise program.  The patient should continue antiplatelet therapy and aggressive treatment of the lipid abnormalities  No changes in the patient's medications at this time     Hortencia Pilar, MD  12/03/2017 1:04 PM

## 2017-12-04 DIAGNOSIS — I5040 Unspecified combined systolic (congestive) and diastolic (congestive) heart failure: Secondary | ICD-10-CM | POA: Diagnosis not present

## 2017-12-04 DIAGNOSIS — E782 Mixed hyperlipidemia: Secondary | ICD-10-CM | POA: Diagnosis not present

## 2017-12-04 DIAGNOSIS — I10 Essential (primary) hypertension: Secondary | ICD-10-CM | POA: Diagnosis not present

## 2017-12-04 DIAGNOSIS — Z794 Long term (current) use of insulin: Secondary | ICD-10-CM | POA: Diagnosis not present

## 2017-12-04 DIAGNOSIS — D649 Anemia, unspecified: Secondary | ICD-10-CM | POA: Diagnosis not present

## 2017-12-04 DIAGNOSIS — N186 End stage renal disease: Secondary | ICD-10-CM | POA: Diagnosis not present

## 2017-12-04 DIAGNOSIS — Z992 Dependence on renal dialysis: Secondary | ICD-10-CM | POA: Diagnosis not present

## 2017-12-04 DIAGNOSIS — E1121 Type 2 diabetes mellitus with diabetic nephropathy: Secondary | ICD-10-CM | POA: Diagnosis not present

## 2017-12-04 DIAGNOSIS — I2510 Atherosclerotic heart disease of native coronary artery without angina pectoris: Secondary | ICD-10-CM | POA: Diagnosis not present

## 2017-12-04 DIAGNOSIS — N185 Chronic kidney disease, stage 5: Secondary | ICD-10-CM | POA: Diagnosis not present

## 2017-12-05 DIAGNOSIS — Z794 Long term (current) use of insulin: Secondary | ICD-10-CM | POA: Diagnosis not present

## 2017-12-05 DIAGNOSIS — E1121 Type 2 diabetes mellitus with diabetic nephropathy: Secondary | ICD-10-CM | POA: Diagnosis not present

## 2017-12-05 DIAGNOSIS — E782 Mixed hyperlipidemia: Secondary | ICD-10-CM | POA: Diagnosis not present

## 2017-12-05 DIAGNOSIS — N185 Chronic kidney disease, stage 5: Secondary | ICD-10-CM | POA: Diagnosis not present

## 2017-12-05 DIAGNOSIS — R8290 Unspecified abnormal findings in urine: Secondary | ICD-10-CM | POA: Diagnosis not present

## 2017-12-05 DIAGNOSIS — I2510 Atherosclerotic heart disease of native coronary artery without angina pectoris: Secondary | ICD-10-CM | POA: Diagnosis not present

## 2017-12-05 DIAGNOSIS — Z992 Dependence on renal dialysis: Secondary | ICD-10-CM | POA: Diagnosis not present

## 2017-12-05 DIAGNOSIS — I5040 Unspecified combined systolic (congestive) and diastolic (congestive) heart failure: Secondary | ICD-10-CM | POA: Diagnosis not present

## 2017-12-05 DIAGNOSIS — N186 End stage renal disease: Secondary | ICD-10-CM | POA: Diagnosis not present

## 2017-12-05 DIAGNOSIS — I10 Essential (primary) hypertension: Secondary | ICD-10-CM | POA: Diagnosis not present

## 2017-12-05 DIAGNOSIS — D649 Anemia, unspecified: Secondary | ICD-10-CM | POA: Diagnosis not present

## 2017-12-06 ENCOUNTER — Ambulatory Visit
Admission: RE | Admit: 2017-12-06 | Discharge: 2017-12-06 | Disposition: A | Discharge status: Home / Self Care | Payer: Medicare HMO | Source: Ambulatory Visit | Attending: Ophthalmology | Admitting: Ophthalmology

## 2017-12-06 ENCOUNTER — Encounter
Admission: RE | Disposition: A | Discharge status: Home / Self Care | Payer: Self-pay | Source: Ambulatory Visit | Attending: Ophthalmology

## 2017-12-06 ENCOUNTER — Ambulatory Visit: Payer: Medicare HMO | Admitting: Registered Nurse

## 2017-12-06 ENCOUNTER — Encounter: Payer: Self-pay | Admitting: *Deleted

## 2017-12-06 ENCOUNTER — Other Ambulatory Visit: Payer: Self-pay

## 2017-12-06 DIAGNOSIS — H2512 Age-related nuclear cataract, left eye: Secondary | ICD-10-CM | POA: Diagnosis not present

## 2017-12-06 DIAGNOSIS — Z87891 Personal history of nicotine dependence: Secondary | ICD-10-CM | POA: Insufficient documentation

## 2017-12-06 DIAGNOSIS — I132 Hypertensive heart and chronic kidney disease with heart failure and with stage 5 chronic kidney disease, or end stage renal disease: Secondary | ICD-10-CM | POA: Diagnosis not present

## 2017-12-06 DIAGNOSIS — I110 Hypertensive heart disease with heart failure: Secondary | ICD-10-CM | POA: Diagnosis not present

## 2017-12-06 DIAGNOSIS — E119 Type 2 diabetes mellitus without complications: Secondary | ICD-10-CM | POA: Diagnosis not present

## 2017-12-06 DIAGNOSIS — D631 Anemia in chronic kidney disease: Secondary | ICD-10-CM | POA: Insufficient documentation

## 2017-12-06 DIAGNOSIS — Z8619 Personal history of other infectious and parasitic diseases: Secondary | ICD-10-CM | POA: Diagnosis not present

## 2017-12-06 DIAGNOSIS — N186 End stage renal disease: Secondary | ICD-10-CM | POA: Insufficient documentation

## 2017-12-06 DIAGNOSIS — I509 Heart failure, unspecified: Secondary | ICD-10-CM | POA: Insufficient documentation

## 2017-12-06 DIAGNOSIS — Z90710 Acquired absence of both cervix and uterus: Secondary | ICD-10-CM | POA: Diagnosis not present

## 2017-12-06 DIAGNOSIS — Z9849 Cataract extraction status, unspecified eye: Secondary | ICD-10-CM | POA: Insufficient documentation

## 2017-12-06 DIAGNOSIS — E1136 Type 2 diabetes mellitus with diabetic cataract: Secondary | ICD-10-CM | POA: Insufficient documentation

## 2017-12-06 DIAGNOSIS — Z880 Allergy status to penicillin: Secondary | ICD-10-CM | POA: Insufficient documentation

## 2017-12-06 DIAGNOSIS — E7800 Pure hypercholesterolemia, unspecified: Secondary | ICD-10-CM | POA: Diagnosis not present

## 2017-12-06 DIAGNOSIS — Z992 Dependence on renal dialysis: Secondary | ICD-10-CM | POA: Insufficient documentation

## 2017-12-06 DIAGNOSIS — I2510 Atherosclerotic heart disease of native coronary artery without angina pectoris: Secondary | ICD-10-CM | POA: Insufficient documentation

## 2017-12-06 DIAGNOSIS — Z955 Presence of coronary angioplasty implant and graft: Secondary | ICD-10-CM | POA: Insufficient documentation

## 2017-12-06 DIAGNOSIS — E1140 Type 2 diabetes mellitus with diabetic neuropathy, unspecified: Secondary | ICD-10-CM | POA: Diagnosis not present

## 2017-12-06 DIAGNOSIS — E1122 Type 2 diabetes mellitus with diabetic chronic kidney disease: Secondary | ICD-10-CM | POA: Insufficient documentation

## 2017-12-06 DIAGNOSIS — E039 Hypothyroidism, unspecified: Secondary | ICD-10-CM | POA: Insufficient documentation

## 2017-12-06 DIAGNOSIS — R251 Tremor, unspecified: Secondary | ICD-10-CM | POA: Diagnosis not present

## 2017-12-06 HISTORY — PX: CATARACT EXTRACTION W/PHACO: SHX586

## 2017-12-06 HISTORY — DX: Other specified complication of vascular prosthetic devices, implants and grafts, initial encounter: T82.898A

## 2017-12-06 LAB — GLUCOSE, CAPILLARY: Glucose-Capillary: 110 mg/dL — ABNORMAL HIGH (ref 65–99)

## 2017-12-06 LAB — POCT I-STAT 4, (NA,K, GLUC, HGB,HCT)
Glucose, Bld: 112 mg/dL — ABNORMAL HIGH (ref 65–99)
HCT: 32 % — ABNORMAL LOW (ref 36.0–46.0)
HEMOGLOBIN: 10.9 g/dL — AB (ref 12.0–15.0)
Potassium: 5.1 mmol/L (ref 3.5–5.1)
Sodium: 142 mmol/L (ref 135–145)

## 2017-12-06 SURGERY — PHACOEMULSIFICATION, CATARACT, WITH IOL INSERTION
Anesthesia: Monitor Anesthesia Care | Site: Eye | Laterality: Left | Wound class: Clean

## 2017-12-06 MED ORDER — SODIUM HYALURONATE 10 MG/ML IO SOLN
INTRAOCULAR | Status: DC | PRN
  Administered 2017-12-06: 0.55 mL via INTRAOCULAR

## 2017-12-06 MED ORDER — LIDOCAINE HCL (PF) 4 % IJ SOLN
INTRAMUSCULAR | Status: AC
  Filled 2017-12-06: qty 5

## 2017-12-06 MED ORDER — MIDAZOLAM HCL 2 MG/2ML IJ SOLN
INTRAMUSCULAR | Status: DC | PRN
  Administered 2017-12-06: 1 mg via INTRAVENOUS

## 2017-12-06 MED ORDER — SODIUM HYALURONATE 23 MG/ML IO SOLN
INTRAOCULAR | Status: DC | PRN
  Administered 2017-12-06: 0.6 mL via INTRAOCULAR

## 2017-12-06 MED ORDER — MIDAZOLAM HCL 2 MG/2ML IJ SOLN
INTRAMUSCULAR | Status: AC
  Filled 2017-12-06: qty 2

## 2017-12-06 MED ORDER — MOXIFLOXACIN HCL 0.5 % OP SOLN: 1.0000 [drp] | OPHTHALMIC | Status: DC | PRN

## 2017-12-06 MED ORDER — POVIDONE-IODINE 5 % OP SOLN
OPHTHALMIC | Status: DC | PRN
  Administered 2017-12-06: 1 via OPHTHALMIC

## 2017-12-06 MED ORDER — ARMC OPHTHALMIC DILATING DROPS
OPHTHALMIC | Status: AC
  Filled 2017-12-06: qty 0.4

## 2017-12-06 MED ORDER — ARMC OPHTHALMIC DILATING DROPS
1.0000 "application " | OPHTHALMIC | Status: AC
  Administered 2017-12-06 (×3): 1 via OPHTHALMIC

## 2017-12-06 MED ORDER — FENTANYL CITRATE (PF) 100 MCG/2ML IJ SOLN
INTRAMUSCULAR | Status: DC | PRN
  Administered 2017-12-06: 50 ug via INTRAVENOUS

## 2017-12-06 MED ORDER — FLUMAZENIL 0.5 MG/5ML IV SOLN
INTRAVENOUS | Status: AC
  Filled 2017-12-06: qty 5

## 2017-12-06 MED ORDER — FENTANYL CITRATE (PF) 100 MCG/2ML IJ SOLN
INTRAMUSCULAR | Status: AC
  Filled 2017-12-06: qty 2

## 2017-12-06 MED ORDER — EPINEPHRINE PF 1 MG/ML IJ SOLN
INTRAMUSCULAR | Status: AC
  Filled 2017-12-06: qty 2

## 2017-12-06 MED ORDER — SODIUM CHLORIDE 0.9 % IV SOLN
INTRAVENOUS | Status: DC
  Administered 2017-12-06: 11:00:00 via INTRAVENOUS

## 2017-12-06 MED ORDER — BSS IO SOLN
INTRAOCULAR | Status: DC | PRN
  Administered 2017-12-06: 11:00:00 via OPHTHALMIC

## 2017-12-06 MED ORDER — MOXIFLOXACIN HCL 0.5 % OP SOLN
OPHTHALMIC | Status: AC
  Filled 2017-12-06: qty 3

## 2017-12-06 MED ORDER — FLUMAZENIL 0.5 MG/5ML IV SOLN
INTRAVENOUS | Status: DC | PRN
  Administered 2017-12-06: 0.3 mg via INTRAVENOUS

## 2017-12-06 MED ORDER — SODIUM HYALURONATE 23 MG/ML IO SOLN
INTRAOCULAR | Status: AC
  Filled 2017-12-06: qty 0.6

## 2017-12-06 MED ORDER — POVIDONE-IODINE 5 % OP SOLN
OPHTHALMIC | Status: AC
  Filled 2017-12-06: qty 30

## 2017-12-06 MED ORDER — LIDOCAINE HCL (PF) 4 % IJ SOLN
INTRAOCULAR | Status: DC | PRN
  Administered 2017-12-06: 4 mL via OPHTHALMIC

## 2017-12-06 MED ORDER — ONDANSETRON HCL 4 MG/2ML IJ SOLN
INTRAMUSCULAR | Status: DC | PRN
  Administered 2017-12-06: 4 mg via INTRAVENOUS

## 2017-12-06 SURGICAL SUPPLY — 16 items
DISSECTOR HYDRO NUCLEUS 50X22 (MISCELLANEOUS) ×3 IMPLANT
GLOVE BIO SURGEON STRL SZ8 (GLOVE) ×3 IMPLANT
GLOVE BIOGEL M 6.5 STRL (GLOVE) ×3 IMPLANT
GLOVE SURG LX 7.5 STRW (GLOVE) ×2
GLOVE SURG LX STRL 7.5 STRW (GLOVE) ×1 IMPLANT
GOWN STRL REUS W/ TWL LRG LVL3 (GOWN DISPOSABLE) ×2 IMPLANT
GOWN STRL REUS W/TWL LRG LVL3 (GOWN DISPOSABLE) ×4
LABEL CATARACT MEDS ST (LABEL) ×3 IMPLANT
LENS IOL TECNIS ITEC 26.0 (Intraocular Lens) ×3 IMPLANT
PACK CATARACT (MISCELLANEOUS) ×3 IMPLANT
PACK CATARACT KING (MISCELLANEOUS) ×3 IMPLANT
PACK EYE AFTER SURG (MISCELLANEOUS) ×3 IMPLANT
SOL BSS BAG (MISCELLANEOUS) ×3
SOLUTION BSS BAG (MISCELLANEOUS) ×1 IMPLANT
WATER STERILE IRR 250ML POUR (IV SOLUTION) ×3 IMPLANT
WIPE NON LINTING 3.25X3.25 (MISCELLANEOUS) ×3 IMPLANT

## 2017-12-06 NOTE — Anesthesia Preprocedure Evaluation (Signed)
Anesthesia Evaluation  Patient identified by MRN, date of birth, ID band Patient awake    Reviewed: Allergy & Precautions, H&P , NPO status , Patient's Chart, lab work & pertinent test results  History of Anesthesia Complications (+) PONV and history of anesthetic complications  Airway Mallampati: III  TM Distance: <3 FB Neck ROM: limited    Dental  (+) Poor Dentition, Missing, Upper Dentures, Lower Dentures   Pulmonary shortness of breath and with exertion, former smoker,           Cardiovascular hypertension, (-) angina+ CAD, + Cardiac Stents and +CHF       Neuro/Psych negative neurological ROS  negative psych ROS   GI/Hepatic negative GI ROS, Neg liver ROS,   Endo/Other  diabetesHypothyroidism   Renal/GU Renal disease  negative genitourinary   Musculoskeletal   Abdominal   Peds  Hematology negative hematology ROS (+)   Anesthesia Other Findings Past Medical History: No date: Anemia No date: CHF (congestive heart failure) (HCC) No date: Chronic kidney disease     Comment:  peritoneal dialysis No date: Complication of anesthesia No date: Coronary artery disease No date: Diabetes mellitus without complication ([place]) No date: Dialysis patient [facility])     Comment:  Peritoneal dialysis patient No date: Dyspnea     Comment:  doe No date: Edema 01/2017: History of recent blood transfusion No date: Hypercholesterolemia No date: Hypertension No date: Hypothyroidism No date: Neuropathy No date: PONV (postoperative nausea and vomiting) No date: Shingles     Comment:  October-November 2017 No date: Tremors of nervous system     Comment:  intermittent when taking gabapentin  Past Surgical History: 01/16/2017: A/V SHUNT INTERVENTION; N/A     Comment:  Procedure: A/V Shunt Intervention;  Surgeon: Katha Cabal, MD;  Location: [place] [facility];  Service:              Cardiovascular;   Laterality: N/A; 01/16/2017: A/V SHUNTOGRAM; Left     Comment:  Procedure: A/V Fistulagram;  Surgeon: Katha Cabal, MD;  Location: [place] [facility];  Service:               Cardiovascular;  Laterality: Left; No date: ABDOMINAL HYSTERECTOMY 11/30/2016: AV FISTULA PLACEMENT; Left     Comment:  Procedure: ARTERIOVENOUS (AV) FISTULA CREATION ( EXPLORE              FOR CREATION BRACHIOCEPHALIC);  Surgeon: Algernon Huxley,               MD;  Location: [facility] ORS;  Service: Vascular;  Laterality:              Left; 11/25/2015: CAPD INSERTION; N/A     Comment:  Procedure: LAPAROSCOPIC INSERTION CONTINUOUS AMBULATORY               PERITONEAL DIALYSIS  (CAPD) CATHETER;  Surgeon: Algernon Huxley, MD;  Location: [facility] ORS;  Service: Vascular;                Laterality: N/A; 01/06/2016: CAPD INSERTION; N/A     Comment:  Procedure: [place]  (CAPD) CATHETER REVISION ;  Surgeon:              Algernon Huxley, MD;  Location: [facility] ORS;  Service: Vascular;              Laterality: N/A; 10/11/2016: CAPD INSERTION; N/A     Comment:  Procedure: LAPAROSCOPIC INSERTION CONTINUOUS AMBULATORY               PERITONEAL DIALYSIS  (CAPD) CATHETER ( REVISION );                Surgeon: Algernon Huxley, MD;  Location: [facility] ORS;  Service:               Vascular;  Laterality: N/A; No date: CARDIAC CATHETERIZATION 05/25/2016: CATARACT EXTRACTION W/PHACO; Right     Comment:  Procedure: CATARACT EXTRACTION PHACO AND INTRAOCULAR               LENS PLACEMENT ([place]);  Surgeon: Eulogio Bear, MD;                Location: [facility] ORS;  Service: Ophthalmology;  Laterality:              Right;  Lot # [id number] H [place]: 00:5.2 AP%: 11.3 CDE: 7.21 No date: CORONARY ANGIOPLASTY 09/07/2016: CORONARY STENT INTERVENTION; N/A     Comment:  Procedure: Coronary Stent Intervention;  Surgeon:               Isaias Cowman, MD;  Location: [place] [facility]  [facility];              Service: Cardiovascular;  Laterality: N/A; 09/21/2016: DIALYSIS/PERMA CATHETER INSERTION; N/A     Comment:  Procedure: Dialysis/Perma Catheter Insertion;  Surgeon:               Algernon Huxley, MD;  Location: [place] [facility];                Service: Cardiovascular;  Laterality: N/A; 11/30/2016: DIALYSIS/PERMA CATHETER REMOVAL; Right     Comment:  Procedure: DIALYSIS/PERMA CATHETER REMOVAL;  Surgeon:               Algernon Huxley, MD;  Location: [facility] ORS;  Service:               Vascular;  Laterality: Right; No date: EYE SURGERY 09/07/2016: LEFT HEART CATH AND CORONARY ANGIOGRAPHY; Left     Comment:  Procedure: Left Heart Cath and Coronary Angiography;                Surgeon: Isaias Cowman, MD;  Location: [place] [facility];  Service: Cardiovascular;  Laterality:               Left; 02/07/2017: LIGATION OF ARTERIOVENOUS  FISTULA; Left     Comment:  Procedure: LIGATION OF ARTERIOVENOUS  FISTULA ( BANDING               BRACHIAL CEPHALIC );  Surgeon: Katha Cabal, MD;                Location: [facility] ORS;  Service: Vascular;  Laterality:               Left; No date: TUBAL LIGATION 01/19/2017: UPPER EXTREMITY ANGIOGRAPHY; Left     Comment:  Procedure: Upper Extremity Angiography;  Surgeon:               Katha Cabal, MD;  Location: [place] [facility];               Service: Cardiovascular;  Laterality: Left;  BMI    Body Mass Index:  30.54 kg/m      Reproductive/Obstetrics negative OB ROS                             Anesthesia Physical  Anesthesia Plan  ASA: III  Anesthesia Plan: MAC   Post-op Pain Management:    Induction:   PONV Risk Score and Plan:   Airway Management Planned: Natural Airway and Nasal Cannula  Additional Equipment:   Intra-op Plan:   Post-operative Plan:   Informed Consent: I have reviewed the patients History and Physical, chart, labs and discussed the procedure including the  risks, benefits and alternatives for the proposed anesthesia with the patient or authorized representative who has indicated his/her understanding and acceptance.   Dental Advisory Given  Plan Discussed with: Anesthesiologist, CRNA and Surgeon  Anesthesia Plan Comments: (Patient has cardiac clearance for this procedure.   Patient informed that they are higher risk for complications from anesthesia during this procedure due to their medical history.  Patient voiced understanding.  Patient consented for risks of anesthesia including but not limited to:  - adverse reactions to medications - risk of intubation if required - damage to teeth, lips or other oral mucosa - sore throat or hoarseness - Damage to heart, brain, lungs or loss of life  Patient voiced understanding.)        Anesthesia Quick Evaluation

## 2017-12-06 NOTE — Discharge Instructions (Signed)
Eye Surgery Discharge Instructions  Expect mild scratchy sensation or mild soreness. DO NOT RUB YOUR EYE!  The day of surgery:  Minimal physical activity, but bed rest is not required  No reading, computer work, or close hand work  No bending, lifting, or straining.  May watch TV  For 24 hours:  No driving, legal decisions, or alcoholic beverages  Safety precautions  Eat anything you prefer: It is better to start with liquids, then soup then solid foods.  _____ Eye patch should be worn until postoperative exam tomorrow.  ____ Solar shield eyeglasses should be worn for comfort in the sunlight/patch while sleeping  Resume all regular medications including aspirin or Coumadin if these were discontinued prior to surgery. You may shower, bathe, shave, or wash your hair. Tylenol may be taken for mild discomfort.  Call your doctor if you experience significant pain, nausea, or vomiting, fever > 101 or other signs of infection. [phone] or [phone] Specific instructions:  Follow-up Information    Eulogio Bear, MD Follow up.   Specialty:  Ophthalmology Why:  May 17 at 10:10am Contact information: [place] [address] [phone]

## 2017-12-06 NOTE — Anesthesia Procedure Notes (Signed)
Performed by: Magdalena Skilton, CRNA Pre-anesthesia Checklist: Patient identified, Emergency Drugs available, Suction available and Patient being monitored Patient Re-evaluated:Patient Re-evaluated prior to induction Oxygen Delivery Method: Nasal cannula Induction Type: IV induction Dental Injury: Teeth and Oropharynx as per pre-operative assessment  Comments: Nasal cannula with etCO2 monitoring       

## 2017-12-06 NOTE — H&P (Signed)
The History and Physical notes are on paper, have been signed, and are to be scanned.   I have examined the patient and there are no changes to the H&P.   Benay Pillow 12/06/2017 11:12 AM

## 2017-12-06 NOTE — Op Note (Signed)
OPERATIVE NOTE  Bethany Lee [id number] 12/06/2017   PREOPERATIVE DIAGNOSIS:  Nuclear sclerotic cataract left eye.  H25.12   POSTOPERATIVE DIAGNOSIS:    Nuclear sclerotic cataract left eye.     PROCEDURE:  Phacoemusification with posterior chamber intraocular lens placement of the left eye   LENS:   Implant Name Type Inv. Item Serial No. Manufacturer Lot No. LRB No. Used  LENS IOL DIOP 26.0 - [id number] Intraocular Lens LENS IOL DIOP 26.0 [id number] AMO  Left 1       PCB00 +26.0   ULTRASOUND TIME: 0 minutes 23.4 seconds.  CDE 1.83   SURGEON:  Benay Pillow, MD, MPH   ANESTHESIA:  Topical with tetracaine drops augmented with 1% preservative-free intracameral lidocaine.  ESTIMATED BLOOD LOSS: <1 mL   COMPLICATIONS:  None.   DESCRIPTION OF PROCEDURE:  The patient was identified in the holding room and transported to the operating room and placed in the supine position under the operating microscope.  The left eye was identified as the operative eye and it was prepped and draped in the usual sterile ophthalmic fashion.   A 1.0 millimeter clear-corneal paracentesis was made at the 5:00 position. 0.5 ml of preservative-free 1% lidocaine with epinephrine was injected into the anterior chamber.  The anterior chamber was filled with Healon 5 viscoelastic.  A 2.4 millimeter keratome was used to make a near-clear corneal incision at the 2:00 position.  A curvilinear capsulorrhexis was made with a cystotome and capsulorrhexis forceps.  Balanced salt solution was used to hydrodissect and hydrodelineate the nucleus.   Phacoemulsification was then used in stop and chop fashion to remove the lens nucleus and epinucleus.  The remaining cortex was then removed using the irrigation and aspiration handpiece. Healon was then placed into the capsular bag to distend it for lens placement.  Some epinucleus had lodged itself under the iris anterior to the capsule subincisionally, which was removed  with additional manipulation and use of the I/A and additional OVD.    A lens was then injected into the capsular bag.  The remaining viscoelastic was aspirated.   Wounds were hydrated with balanced salt solution.  The anterior chamber was inflated to a physiologic pressure with balanced salt solution.  Intracameral vigamox 0.1 mL undiltued was injected into the eye and a drop placed onto the ocular surface.  No wound leaks were noted.  The patient was taken to the recovery room in stable condition without complications of anesthesia or surgery  Benay Pillow 12/06/2017, 11:55 AM

## 2017-12-06 NOTE — Anesthesia Post-op Follow-up Note (Signed)
Anesthesia QCDR form completed.        

## 2017-12-06 NOTE — Anesthesia Postprocedure Evaluation (Signed)
Anesthesia Post Note  Patient: Bethany Lee  Procedure(s) Performed: CATARACT EXTRACTION PHACO AND INTRAOCULAR LENS PLACEMENT ([place]) (Left Eye)  Patient location during evaluation: PACU Anesthesia Type: MAC Level of consciousness: awake and alert Pain management: pain level controlled Vital Signs Assessment: post-procedure vital signs reviewed and stable Respiratory status: spontaneous breathing, nonlabored ventilation, respiratory function stable and patient connected to nasal cannula oxygen Cardiovascular status: stable and blood pressure returned to baseline Postop Assessment: no apparent nausea or vomiting Anesthetic complications: no     Last Vitals:  Vitals:   12/06/17 1153 12/06/17 1155  BP: (!) 131/49 (!) 140/48  Pulse:    Resp:  (!) 76  Temp:  37.1 C  SpO2:  98%    Last Pain:  Vitals:   12/06/17 1155  TempSrc: Oral  PainSc:                  Martha Clan

## 2017-12-06 NOTE — Transfer of Care (Signed)
Immediate Anesthesia Transfer of Care Note  Patient: Bethany Lee  Procedure(s) Performed: Procedure(s) with comments: CATARACT EXTRACTION PHACO AND INTRAOCULAR LENS PLACEMENT ([facility]) (Left) - [place] 00.23.4 AP% 7.9 CDE 1.83 Fluid Pack Lot # [id number]  Patient Location: PHASE II  Anesthesia Type:MAC  Level of Consciousness: Awake, Alert, Oriented  Airway & Oxygen Therapy: Patient Spontanous Breathing and Patient on room air   Post-op Assessment: Report given to RN and Post -op Vital signs reviewed and stable  Post vital signs: Reviewed and stable  Last Vitals:  Vitals:   12/06/17 1001 12/06/17 1155  BP: 127/64 (!) 140/48  Pulse: 75   Resp: 18 (!) 76  Temp: 36.8 C 37.1 C  SpO2: 63% 01%    Complications: No apparent anesthesia complications

## 2017-12-07 DIAGNOSIS — N186 End stage renal disease: Secondary | ICD-10-CM | POA: Diagnosis not present

## 2017-12-07 DIAGNOSIS — Z992 Dependence on renal dialysis: Secondary | ICD-10-CM | POA: Diagnosis not present

## 2017-12-08 DIAGNOSIS — Z992 Dependence on renal dialysis: Secondary | ICD-10-CM | POA: Diagnosis not present

## 2017-12-08 DIAGNOSIS — N186 End stage renal disease: Secondary | ICD-10-CM | POA: Diagnosis not present

## 2017-12-09 DIAGNOSIS — N186 End stage renal disease: Secondary | ICD-10-CM | POA: Diagnosis not present

## 2017-12-09 DIAGNOSIS — Z992 Dependence on renal dialysis: Secondary | ICD-10-CM | POA: Diagnosis not present

## 2017-12-10 DIAGNOSIS — N186 End stage renal disease: Secondary | ICD-10-CM | POA: Diagnosis not present

## 2017-12-10 DIAGNOSIS — Z992 Dependence on renal dialysis: Secondary | ICD-10-CM | POA: Diagnosis not present

## 2017-12-11 DIAGNOSIS — Z992 Dependence on renal dialysis: Secondary | ICD-10-CM | POA: Diagnosis not present

## 2017-12-11 DIAGNOSIS — N186 End stage renal disease: Secondary | ICD-10-CM | POA: Diagnosis not present

## 2017-12-12 DIAGNOSIS — Z992 Dependence on renal dialysis: Secondary | ICD-10-CM | POA: Diagnosis not present

## 2017-12-12 DIAGNOSIS — N186 End stage renal disease: Secondary | ICD-10-CM | POA: Diagnosis not present

## 2017-12-13 ENCOUNTER — Other Ambulatory Visit: Payer: Self-pay | Admitting: Internal Medicine

## 2017-12-13 DIAGNOSIS — E1121 Type 2 diabetes mellitus with diabetic nephropathy: Secondary | ICD-10-CM | POA: Diagnosis not present

## 2017-12-13 DIAGNOSIS — Z794 Long term (current) use of insulin: Secondary | ICD-10-CM | POA: Diagnosis not present

## 2017-12-13 DIAGNOSIS — Z0000 Encounter for general adult medical examination without abnormal findings: Secondary | ICD-10-CM | POA: Diagnosis not present

## 2017-12-13 DIAGNOSIS — G63 Polyneuropathy in diseases classified elsewhere: Secondary | ICD-10-CM | POA: Diagnosis not present

## 2017-12-13 DIAGNOSIS — N186 End stage renal disease: Secondary | ICD-10-CM | POA: Diagnosis not present

## 2017-12-13 DIAGNOSIS — B351 Tinea unguium: Secondary | ICD-10-CM | POA: Diagnosis not present

## 2017-12-13 DIAGNOSIS — Z1231 Encounter for screening mammogram for malignant neoplasm of breast: Secondary | ICD-10-CM

## 2017-12-13 DIAGNOSIS — L851 Acquired keratosis [keratoderma] palmaris et plantaris: Secondary | ICD-10-CM | POA: Diagnosis not present

## 2017-12-13 DIAGNOSIS — Z992 Dependence on renal dialysis: Secondary | ICD-10-CM | POA: Diagnosis not present

## 2017-12-13 DIAGNOSIS — I2510 Atherosclerotic heart disease of native coronary artery without angina pectoris: Secondary | ICD-10-CM | POA: Diagnosis not present

## 2017-12-13 DIAGNOSIS — I10 Essential (primary) hypertension: Secondary | ICD-10-CM | POA: Diagnosis not present

## 2017-12-13 DIAGNOSIS — E1140 Type 2 diabetes mellitus with diabetic neuropathy, unspecified: Secondary | ICD-10-CM | POA: Diagnosis not present

## 2017-12-14 DIAGNOSIS — Z992 Dependence on renal dialysis: Secondary | ICD-10-CM | POA: Diagnosis not present

## 2017-12-14 DIAGNOSIS — N186 End stage renal disease: Secondary | ICD-10-CM | POA: Diagnosis not present

## 2017-12-15 DIAGNOSIS — N186 End stage renal disease: Secondary | ICD-10-CM | POA: Diagnosis not present

## 2017-12-15 DIAGNOSIS — Z992 Dependence on renal dialysis: Secondary | ICD-10-CM | POA: Diagnosis not present

## 2017-12-16 DIAGNOSIS — N186 End stage renal disease: Secondary | ICD-10-CM | POA: Diagnosis not present

## 2017-12-16 DIAGNOSIS — Z992 Dependence on renal dialysis: Secondary | ICD-10-CM | POA: Diagnosis not present

## 2017-12-17 DIAGNOSIS — Z992 Dependence on renal dialysis: Secondary | ICD-10-CM | POA: Diagnosis not present

## 2017-12-17 DIAGNOSIS — N186 End stage renal disease: Secondary | ICD-10-CM | POA: Diagnosis not present

## 2017-12-18 DIAGNOSIS — N186 End stage renal disease: Secondary | ICD-10-CM | POA: Diagnosis not present

## 2017-12-18 DIAGNOSIS — Z992 Dependence on renal dialysis: Secondary | ICD-10-CM | POA: Diagnosis not present

## 2017-12-19 DIAGNOSIS — E113412 Type 2 diabetes mellitus with severe nonproliferative diabetic retinopathy with macular edema, left eye: Secondary | ICD-10-CM | POA: Diagnosis not present

## 2017-12-19 DIAGNOSIS — Z992 Dependence on renal dialysis: Secondary | ICD-10-CM | POA: Diagnosis not present

## 2017-12-19 DIAGNOSIS — N186 End stage renal disease: Secondary | ICD-10-CM | POA: Diagnosis not present

## 2017-12-20 DIAGNOSIS — N186 End stage renal disease: Secondary | ICD-10-CM | POA: Diagnosis not present

## 2017-12-20 DIAGNOSIS — Z992 Dependence on renal dialysis: Secondary | ICD-10-CM | POA: Diagnosis not present

## 2017-12-21 DIAGNOSIS — N186 End stage renal disease: Secondary | ICD-10-CM | POA: Diagnosis not present

## 2017-12-21 DIAGNOSIS — Z992 Dependence on renal dialysis: Secondary | ICD-10-CM | POA: Diagnosis not present

## 2017-12-22 DIAGNOSIS — N186 End stage renal disease: Secondary | ICD-10-CM | POA: Diagnosis not present

## 2017-12-22 DIAGNOSIS — Z992 Dependence on renal dialysis: Secondary | ICD-10-CM | POA: Diagnosis not present

## 2017-12-23 DIAGNOSIS — Z992 Dependence on renal dialysis: Secondary | ICD-10-CM | POA: Diagnosis not present

## 2017-12-23 DIAGNOSIS — N186 End stage renal disease: Secondary | ICD-10-CM | POA: Diagnosis not present

## 2017-12-24 DIAGNOSIS — N186 End stage renal disease: Secondary | ICD-10-CM | POA: Diagnosis not present

## 2017-12-24 DIAGNOSIS — Z992 Dependence on renal dialysis: Secondary | ICD-10-CM | POA: Diagnosis not present

## 2017-12-25 DIAGNOSIS — Z992 Dependence on renal dialysis: Secondary | ICD-10-CM | POA: Diagnosis not present

## 2017-12-25 DIAGNOSIS — N186 End stage renal disease: Secondary | ICD-10-CM | POA: Diagnosis not present

## 2017-12-26 DIAGNOSIS — Z992 Dependence on renal dialysis: Secondary | ICD-10-CM | POA: Diagnosis not present

## 2017-12-26 DIAGNOSIS — N186 End stage renal disease: Secondary | ICD-10-CM | POA: Diagnosis not present

## 2017-12-27 DIAGNOSIS — N186 End stage renal disease: Secondary | ICD-10-CM | POA: Diagnosis not present

## 2017-12-27 DIAGNOSIS — Z992 Dependence on renal dialysis: Secondary | ICD-10-CM | POA: Diagnosis not present

## 2017-12-28 ENCOUNTER — Encounter: Admit: 2017-12-28 | Discharge: 2017-12-28 | Attending: Internal Medicine | Primary: Internal Medicine

## 2017-12-28 DIAGNOSIS — N186 End stage renal disease: Secondary | ICD-10-CM | POA: Diagnosis not present

## 2017-12-28 DIAGNOSIS — Z992 Dependence on renal dialysis: Secondary | ICD-10-CM | POA: Diagnosis not present

## 2017-12-29 DIAGNOSIS — Z992 Dependence on renal dialysis: Secondary | ICD-10-CM | POA: Diagnosis not present

## 2017-12-29 DIAGNOSIS — N186 End stage renal disease: Secondary | ICD-10-CM | POA: Diagnosis not present

## 2017-12-30 DIAGNOSIS — N186 End stage renal disease: Secondary | ICD-10-CM | POA: Diagnosis not present

## 2017-12-30 DIAGNOSIS — Z992 Dependence on renal dialysis: Secondary | ICD-10-CM | POA: Diagnosis not present

## 2017-12-31 DIAGNOSIS — Z992 Dependence on renal dialysis: Secondary | ICD-10-CM | POA: Diagnosis not present

## 2017-12-31 DIAGNOSIS — N186 End stage renal disease: Secondary | ICD-10-CM | POA: Diagnosis not present

## 2018-01-01 DIAGNOSIS — N186 End stage renal disease: Secondary | ICD-10-CM | POA: Diagnosis not present

## 2018-01-01 DIAGNOSIS — Z992 Dependence on renal dialysis: Secondary | ICD-10-CM | POA: Diagnosis not present

## 2018-01-02 DIAGNOSIS — N186 End stage renal disease: Secondary | ICD-10-CM | POA: Diagnosis not present

## 2018-01-02 DIAGNOSIS — Z992 Dependence on renal dialysis: Secondary | ICD-10-CM | POA: Diagnosis not present

## 2018-01-03 DIAGNOSIS — N186 End stage renal disease: Secondary | ICD-10-CM | POA: Diagnosis not present

## 2018-01-03 DIAGNOSIS — Z992 Dependence on renal dialysis: Secondary | ICD-10-CM | POA: Diagnosis not present

## 2018-01-04 DIAGNOSIS — N186 End stage renal disease: Secondary | ICD-10-CM | POA: Diagnosis not present

## 2018-01-04 DIAGNOSIS — Z992 Dependence on renal dialysis: Secondary | ICD-10-CM | POA: Diagnosis not present

## 2018-01-04 DIAGNOSIS — Z7682 Awaiting organ transplant status: Principal | ICD-10-CM

## 2018-01-04 DIAGNOSIS — Z01818 Encounter for other preprocedural examination: Secondary | ICD-10-CM

## 2018-01-05 DIAGNOSIS — Z992 Dependence on renal dialysis: Secondary | ICD-10-CM | POA: Diagnosis not present

## 2018-01-05 DIAGNOSIS — N186 End stage renal disease: Secondary | ICD-10-CM | POA: Diagnosis not present

## 2018-01-06 DIAGNOSIS — N186 End stage renal disease: Secondary | ICD-10-CM | POA: Diagnosis not present

## 2018-01-06 DIAGNOSIS — Z992 Dependence on renal dialysis: Secondary | ICD-10-CM | POA: Diagnosis not present

## 2018-01-07 DIAGNOSIS — N186 End stage renal disease: Secondary | ICD-10-CM | POA: Diagnosis not present

## 2018-01-07 DIAGNOSIS — Z992 Dependence on renal dialysis: Secondary | ICD-10-CM | POA: Diagnosis not present

## 2018-01-08 DIAGNOSIS — N186 End stage renal disease: Secondary | ICD-10-CM | POA: Diagnosis not present

## 2018-01-08 DIAGNOSIS — Z992 Dependence on renal dialysis: Secondary | ICD-10-CM | POA: Diagnosis not present

## 2018-01-09 DIAGNOSIS — Z992 Dependence on renal dialysis: Secondary | ICD-10-CM | POA: Diagnosis not present

## 2018-01-09 DIAGNOSIS — N186 End stage renal disease: Secondary | ICD-10-CM | POA: Diagnosis not present

## 2018-01-10 DIAGNOSIS — N186 End stage renal disease: Secondary | ICD-10-CM | POA: Diagnosis not present

## 2018-01-10 DIAGNOSIS — Z992 Dependence on renal dialysis: Secondary | ICD-10-CM | POA: Diagnosis not present

## 2018-01-11 DIAGNOSIS — Z992 Dependence on renal dialysis: Secondary | ICD-10-CM | POA: Diagnosis not present

## 2018-01-11 DIAGNOSIS — N186 End stage renal disease: Secondary | ICD-10-CM | POA: Diagnosis not present

## 2018-01-12 DIAGNOSIS — Z992 Dependence on renal dialysis: Secondary | ICD-10-CM | POA: Diagnosis not present

## 2018-01-12 DIAGNOSIS — N186 End stage renal disease: Secondary | ICD-10-CM | POA: Diagnosis not present

## 2018-01-13 DIAGNOSIS — N186 End stage renal disease: Secondary | ICD-10-CM | POA: Diagnosis not present

## 2018-01-13 DIAGNOSIS — Z992 Dependence on renal dialysis: Secondary | ICD-10-CM | POA: Diagnosis not present

## 2018-01-14 DIAGNOSIS — N186 End stage renal disease: Secondary | ICD-10-CM | POA: Diagnosis not present

## 2018-01-14 DIAGNOSIS — Z992 Dependence on renal dialysis: Secondary | ICD-10-CM | POA: Diagnosis not present

## 2018-01-15 DIAGNOSIS — N186 End stage renal disease: Secondary | ICD-10-CM | POA: Diagnosis not present

## 2018-01-15 DIAGNOSIS — Z992 Dependence on renal dialysis: Secondary | ICD-10-CM | POA: Diagnosis not present

## 2018-01-16 DIAGNOSIS — Z992 Dependence on renal dialysis: Secondary | ICD-10-CM | POA: Diagnosis not present

## 2018-01-16 DIAGNOSIS — N186 End stage renal disease: Secondary | ICD-10-CM | POA: Diagnosis not present

## 2018-01-17 DIAGNOSIS — N186 End stage renal disease: Secondary | ICD-10-CM | POA: Diagnosis not present

## 2018-01-17 DIAGNOSIS — Z992 Dependence on renal dialysis: Secondary | ICD-10-CM | POA: Diagnosis not present

## 2018-01-18 DIAGNOSIS — N186 End stage renal disease: Secondary | ICD-10-CM | POA: Diagnosis not present

## 2018-01-18 DIAGNOSIS — Z992 Dependence on renal dialysis: Secondary | ICD-10-CM | POA: Diagnosis not present

## 2018-01-19 DIAGNOSIS — N186 End stage renal disease: Secondary | ICD-10-CM | POA: Diagnosis not present

## 2018-01-19 DIAGNOSIS — Z992 Dependence on renal dialysis: Secondary | ICD-10-CM | POA: Diagnosis not present

## 2018-01-20 DIAGNOSIS — Z992 Dependence on renal dialysis: Secondary | ICD-10-CM | POA: Diagnosis not present

## 2018-01-20 DIAGNOSIS — N186 End stage renal disease: Secondary | ICD-10-CM | POA: Diagnosis not present

## 2018-01-21 DIAGNOSIS — N186 End stage renal disease: Secondary | ICD-10-CM | POA: Diagnosis not present

## 2018-01-21 DIAGNOSIS — Z992 Dependence on renal dialysis: Secondary | ICD-10-CM | POA: Diagnosis not present

## 2018-01-22 DIAGNOSIS — Z992 Dependence on renal dialysis: Secondary | ICD-10-CM | POA: Diagnosis not present

## 2018-01-22 DIAGNOSIS — N186 End stage renal disease: Secondary | ICD-10-CM | POA: Diagnosis not present

## 2018-01-23 ENCOUNTER — Ambulatory Visit
Admission: RE | Admit: 2018-01-23 | Discharge: 2018-01-23 | Disposition: A | Discharge status: Home / Self Care | Payer: Medicare HMO | Source: Ambulatory Visit | Attending: Internal Medicine | Admitting: Internal Medicine

## 2018-01-23 DIAGNOSIS — Z992 Dependence on renal dialysis: Secondary | ICD-10-CM | POA: Diagnosis not present

## 2018-01-23 DIAGNOSIS — Z1231 Encounter for screening mammogram for malignant neoplasm of breast: Secondary | ICD-10-CM | POA: Insufficient documentation

## 2018-01-23 DIAGNOSIS — N186 End stage renal disease: Secondary | ICD-10-CM | POA: Diagnosis not present

## 2018-01-24 DIAGNOSIS — N186 End stage renal disease: Secondary | ICD-10-CM | POA: Diagnosis not present

## 2018-01-24 DIAGNOSIS — Z992 Dependence on renal dialysis: Secondary | ICD-10-CM | POA: Diagnosis not present

## 2018-01-25 ENCOUNTER — Encounter: Admit: 2018-01-25 | Discharge: 2018-01-25 | Payer: MEDICARE | Attending: Internal Medicine | Primary: Internal Medicine

## 2018-01-25 DIAGNOSIS — Z7682 Awaiting organ transplant status: Secondary | ICD-10-CM | POA: Diagnosis not present

## 2018-01-25 DIAGNOSIS — Z992 Dependence on renal dialysis: Secondary | ICD-10-CM | POA: Diagnosis not present

## 2018-01-25 DIAGNOSIS — N186 End stage renal disease: Secondary | ICD-10-CM | POA: Diagnosis not present

## 2018-01-25 DIAGNOSIS — Z794 Long term (current) use of insulin: Secondary | ICD-10-CM | POA: Diagnosis not present

## 2018-01-25 DIAGNOSIS — Z01818 Encounter for other preprocedural examination: Secondary | ICD-10-CM | POA: Diagnosis not present

## 2018-01-25 DIAGNOSIS — E785 Hyperlipidemia, unspecified: Secondary | ICD-10-CM | POA: Diagnosis not present

## 2018-01-25 DIAGNOSIS — E119 Type 2 diabetes mellitus without complications: Secondary | ICD-10-CM | POA: Diagnosis not present

## 2018-01-26 DIAGNOSIS — N186 End stage renal disease: Secondary | ICD-10-CM | POA: Diagnosis not present

## 2018-01-26 DIAGNOSIS — Z992 Dependence on renal dialysis: Secondary | ICD-10-CM | POA: Diagnosis not present

## 2018-01-27 DIAGNOSIS — Z992 Dependence on renal dialysis: Secondary | ICD-10-CM | POA: Diagnosis not present

## 2018-01-27 DIAGNOSIS — N186 End stage renal disease: Secondary | ICD-10-CM | POA: Diagnosis not present

## 2018-01-28 DIAGNOSIS — Z992 Dependence on renal dialysis: Secondary | ICD-10-CM | POA: Diagnosis not present

## 2018-01-28 DIAGNOSIS — N186 End stage renal disease: Secondary | ICD-10-CM | POA: Diagnosis not present

## 2018-01-29 DIAGNOSIS — Z992 Dependence on renal dialysis: Secondary | ICD-10-CM | POA: Diagnosis not present

## 2018-01-29 DIAGNOSIS — N186 End stage renal disease: Secondary | ICD-10-CM | POA: Diagnosis not present

## 2018-01-30 DIAGNOSIS — N186 End stage renal disease: Secondary | ICD-10-CM | POA: Diagnosis not present

## 2018-01-30 DIAGNOSIS — Z992 Dependence on renal dialysis: Secondary | ICD-10-CM | POA: Diagnosis not present

## 2018-01-31 DIAGNOSIS — N186 End stage renal disease: Secondary | ICD-10-CM | POA: Diagnosis not present

## 2018-01-31 DIAGNOSIS — Z992 Dependence on renal dialysis: Secondary | ICD-10-CM | POA: Diagnosis not present

## 2018-02-01 DIAGNOSIS — Z992 Dependence on renal dialysis: Secondary | ICD-10-CM | POA: Diagnosis not present

## 2018-02-01 DIAGNOSIS — N186 End stage renal disease: Secondary | ICD-10-CM | POA: Diagnosis not present

## 2018-02-01 DIAGNOSIS — Z7682 Awaiting organ transplant status: Principal | ICD-10-CM

## 2018-02-01 DIAGNOSIS — Z01818 Encounter for other preprocedural examination: Secondary | ICD-10-CM

## 2018-02-02 DIAGNOSIS — Z992 Dependence on renal dialysis: Secondary | ICD-10-CM | POA: Diagnosis not present

## 2018-02-02 DIAGNOSIS — N186 End stage renal disease: Secondary | ICD-10-CM | POA: Diagnosis not present

## 2018-02-03 DIAGNOSIS — Z992 Dependence on renal dialysis: Secondary | ICD-10-CM | POA: Diagnosis not present

## 2018-02-03 DIAGNOSIS — N186 End stage renal disease: Secondary | ICD-10-CM | POA: Diagnosis not present

## 2018-02-04 DIAGNOSIS — N186 End stage renal disease: Secondary | ICD-10-CM | POA: Diagnosis not present

## 2018-02-04 DIAGNOSIS — Z992 Dependence on renal dialysis: Secondary | ICD-10-CM | POA: Diagnosis not present

## 2018-02-05 DIAGNOSIS — Z992 Dependence on renal dialysis: Secondary | ICD-10-CM | POA: Diagnosis not present

## 2018-02-05 DIAGNOSIS — N186 End stage renal disease: Secondary | ICD-10-CM | POA: Diagnosis not present

## 2018-02-06 DIAGNOSIS — Z992 Dependence on renal dialysis: Secondary | ICD-10-CM | POA: Diagnosis not present

## 2018-02-06 DIAGNOSIS — N186 End stage renal disease: Secondary | ICD-10-CM | POA: Diagnosis not present

## 2018-02-07 DIAGNOSIS — Z992 Dependence on renal dialysis: Secondary | ICD-10-CM | POA: Diagnosis not present

## 2018-02-07 DIAGNOSIS — N186 End stage renal disease: Secondary | ICD-10-CM | POA: Diagnosis not present

## 2018-02-08 DIAGNOSIS — N186 End stage renal disease: Secondary | ICD-10-CM | POA: Diagnosis not present

## 2018-02-08 DIAGNOSIS — Z992 Dependence on renal dialysis: Secondary | ICD-10-CM | POA: Diagnosis not present

## 2018-02-09 DIAGNOSIS — Z992 Dependence on renal dialysis: Secondary | ICD-10-CM | POA: Diagnosis not present

## 2018-02-09 DIAGNOSIS — N186 End stage renal disease: Secondary | ICD-10-CM | POA: Diagnosis not present

## 2018-02-10 DIAGNOSIS — N186 End stage renal disease: Secondary | ICD-10-CM | POA: Diagnosis not present

## 2018-02-10 DIAGNOSIS — Z992 Dependence on renal dialysis: Secondary | ICD-10-CM | POA: Diagnosis not present

## 2018-02-11 DIAGNOSIS — E1122 Type 2 diabetes mellitus with diabetic chronic kidney disease: Secondary | ICD-10-CM | POA: Diagnosis not present

## 2018-02-11 DIAGNOSIS — E113412 Type 2 diabetes mellitus with severe nonproliferative diabetic retinopathy with macular edema, left eye: Secondary | ICD-10-CM | POA: Diagnosis not present

## 2018-02-11 DIAGNOSIS — E1159 Type 2 diabetes mellitus with other circulatory complications: Secondary | ICD-10-CM | POA: Diagnosis not present

## 2018-02-11 DIAGNOSIS — E1142 Type 2 diabetes mellitus with diabetic polyneuropathy: Secondary | ICD-10-CM | POA: Diagnosis not present

## 2018-02-11 DIAGNOSIS — E113511 Type 2 diabetes mellitus with proliferative diabetic retinopathy with macular edema, right eye: Secondary | ICD-10-CM | POA: Diagnosis not present

## 2018-02-11 DIAGNOSIS — N186 End stage renal disease: Secondary | ICD-10-CM | POA: Diagnosis not present

## 2018-02-11 DIAGNOSIS — Z992 Dependence on renal dialysis: Secondary | ICD-10-CM | POA: Diagnosis not present

## 2018-02-11 DIAGNOSIS — Z794 Long term (current) use of insulin: Secondary | ICD-10-CM | POA: Diagnosis not present

## 2018-02-12 DIAGNOSIS — N186 End stage renal disease: Secondary | ICD-10-CM | POA: Diagnosis not present

## 2018-02-12 DIAGNOSIS — Z992 Dependence on renal dialysis: Secondary | ICD-10-CM | POA: Diagnosis not present

## 2018-02-13 DIAGNOSIS — N186 End stage renal disease: Secondary | ICD-10-CM | POA: Diagnosis not present

## 2018-02-13 DIAGNOSIS — Z992 Dependence on renal dialysis: Secondary | ICD-10-CM | POA: Diagnosis not present

## 2018-02-14 DIAGNOSIS — Z992 Dependence on renal dialysis: Secondary | ICD-10-CM | POA: Diagnosis not present

## 2018-02-14 DIAGNOSIS — N186 End stage renal disease: Secondary | ICD-10-CM | POA: Diagnosis not present

## 2018-02-15 DIAGNOSIS — Z992 Dependence on renal dialysis: Secondary | ICD-10-CM | POA: Diagnosis not present

## 2018-02-15 DIAGNOSIS — N186 End stage renal disease: Secondary | ICD-10-CM | POA: Diagnosis not present

## 2018-02-16 DIAGNOSIS — Z992 Dependence on renal dialysis: Secondary | ICD-10-CM | POA: Diagnosis not present

## 2018-02-16 DIAGNOSIS — N186 End stage renal disease: Secondary | ICD-10-CM | POA: Diagnosis not present

## 2018-02-17 DIAGNOSIS — N186 End stage renal disease: Secondary | ICD-10-CM | POA: Diagnosis not present

## 2018-02-17 DIAGNOSIS — Z992 Dependence on renal dialysis: Secondary | ICD-10-CM | POA: Diagnosis not present

## 2018-02-18 DIAGNOSIS — N186 End stage renal disease: Secondary | ICD-10-CM | POA: Diagnosis not present

## 2018-02-18 DIAGNOSIS — Z992 Dependence on renal dialysis: Secondary | ICD-10-CM | POA: Diagnosis not present

## 2018-02-19 DIAGNOSIS — N186 End stage renal disease: Secondary | ICD-10-CM | POA: Diagnosis not present

## 2018-02-19 DIAGNOSIS — Z992 Dependence on renal dialysis: Secondary | ICD-10-CM | POA: Diagnosis not present

## 2018-02-20 DIAGNOSIS — Z992 Dependence on renal dialysis: Secondary | ICD-10-CM | POA: Diagnosis not present

## 2018-02-20 DIAGNOSIS — N186 End stage renal disease: Secondary | ICD-10-CM | POA: Diagnosis not present

## 2018-02-21 DIAGNOSIS — Z992 Dependence on renal dialysis: Secondary | ICD-10-CM | POA: Diagnosis not present

## 2018-02-21 DIAGNOSIS — N186 End stage renal disease: Secondary | ICD-10-CM | POA: Diagnosis not present

## 2018-02-22 DIAGNOSIS — Z992 Dependence on renal dialysis: Secondary | ICD-10-CM | POA: Diagnosis not present

## 2018-02-22 DIAGNOSIS — N186 End stage renal disease: Secondary | ICD-10-CM | POA: Diagnosis not present

## 2018-02-23 DIAGNOSIS — Z992 Dependence on renal dialysis: Secondary | ICD-10-CM | POA: Diagnosis not present

## 2018-02-23 DIAGNOSIS — N186 End stage renal disease: Secondary | ICD-10-CM | POA: Diagnosis not present

## 2018-02-24 DIAGNOSIS — N186 End stage renal disease: Secondary | ICD-10-CM | POA: Diagnosis not present

## 2018-02-24 DIAGNOSIS — Z992 Dependence on renal dialysis: Secondary | ICD-10-CM | POA: Diagnosis not present

## 2018-02-25 DIAGNOSIS — Z992 Dependence on renal dialysis: Secondary | ICD-10-CM | POA: Diagnosis not present

## 2018-02-25 DIAGNOSIS — N186 End stage renal disease: Secondary | ICD-10-CM | POA: Diagnosis not present

## 2018-02-26 DIAGNOSIS — N186 End stage renal disease: Secondary | ICD-10-CM | POA: Diagnosis not present

## 2018-02-26 DIAGNOSIS — Z992 Dependence on renal dialysis: Secondary | ICD-10-CM | POA: Diagnosis not present

## 2018-02-27 DIAGNOSIS — N186 End stage renal disease: Secondary | ICD-10-CM | POA: Diagnosis not present

## 2018-02-27 DIAGNOSIS — T85611A Breakdown (mechanical) of intraperitoneal dialysis catheter, initial encounter: Secondary | ICD-10-CM | POA: Diagnosis not present

## 2018-02-27 DIAGNOSIS — Z992 Dependence on renal dialysis: Secondary | ICD-10-CM | POA: Diagnosis not present

## 2018-02-28 ENCOUNTER — Encounter: Admit: 2018-02-28 | Discharge: 2018-02-28 | Payer: MEDICARE | Attending: Internal Medicine | Primary: Internal Medicine

## 2018-02-28 DIAGNOSIS — Z992 Dependence on renal dialysis: Secondary | ICD-10-CM | POA: Diagnosis not present

## 2018-02-28 DIAGNOSIS — E113412 Type 2 diabetes mellitus with severe nonproliferative diabetic retinopathy with macular edema, left eye: Secondary | ICD-10-CM | POA: Diagnosis not present

## 2018-02-28 DIAGNOSIS — N186 End stage renal disease: Secondary | ICD-10-CM | POA: Diagnosis not present

## 2018-03-01 DIAGNOSIS — Z992 Dependence on renal dialysis: Secondary | ICD-10-CM | POA: Diagnosis not present

## 2018-03-01 DIAGNOSIS — N186 End stage renal disease: Secondary | ICD-10-CM | POA: Diagnosis not present

## 2018-03-02 DIAGNOSIS — N186 End stage renal disease: Secondary | ICD-10-CM | POA: Diagnosis not present

## 2018-03-02 DIAGNOSIS — Z992 Dependence on renal dialysis: Secondary | ICD-10-CM | POA: Diagnosis not present

## 2018-03-03 DIAGNOSIS — Z992 Dependence on renal dialysis: Secondary | ICD-10-CM | POA: Diagnosis not present

## 2018-03-03 DIAGNOSIS — N186 End stage renal disease: Secondary | ICD-10-CM | POA: Diagnosis not present

## 2018-03-04 DIAGNOSIS — N186 End stage renal disease: Secondary | ICD-10-CM | POA: Diagnosis not present

## 2018-03-04 DIAGNOSIS — Z992 Dependence on renal dialysis: Secondary | ICD-10-CM | POA: Diagnosis not present

## 2018-03-05 DIAGNOSIS — Z992 Dependence on renal dialysis: Secondary | ICD-10-CM | POA: Diagnosis not present

## 2018-03-05 DIAGNOSIS — N186 End stage renal disease: Secondary | ICD-10-CM | POA: Diagnosis not present

## 2018-03-06 DIAGNOSIS — Z992 Dependence on renal dialysis: Secondary | ICD-10-CM | POA: Diagnosis not present

## 2018-03-06 DIAGNOSIS — N186 End stage renal disease: Secondary | ICD-10-CM | POA: Diagnosis not present

## 2018-03-07 DIAGNOSIS — N186 End stage renal disease: Secondary | ICD-10-CM | POA: Diagnosis not present

## 2018-03-07 DIAGNOSIS — Z992 Dependence on renal dialysis: Secondary | ICD-10-CM | POA: Diagnosis not present

## 2018-03-07 DIAGNOSIS — Z7682 Awaiting organ transplant status: Principal | ICD-10-CM

## 2018-03-07 DIAGNOSIS — Z01818 Encounter for other preprocedural examination: Secondary | ICD-10-CM

## 2018-03-08 DIAGNOSIS — E1121 Type 2 diabetes mellitus with diabetic nephropathy: Secondary | ICD-10-CM | POA: Diagnosis not present

## 2018-03-08 DIAGNOSIS — I2510 Atherosclerotic heart disease of native coronary artery without angina pectoris: Secondary | ICD-10-CM | POA: Diagnosis not present

## 2018-03-08 DIAGNOSIS — Z0000 Encounter for general adult medical examination without abnormal findings: Secondary | ICD-10-CM | POA: Diagnosis not present

## 2018-03-08 DIAGNOSIS — N186 End stage renal disease: Secondary | ICD-10-CM | POA: Diagnosis not present

## 2018-03-08 DIAGNOSIS — I10 Essential (primary) hypertension: Secondary | ICD-10-CM | POA: Diagnosis not present

## 2018-03-08 DIAGNOSIS — G63 Polyneuropathy in diseases classified elsewhere: Secondary | ICD-10-CM | POA: Diagnosis not present

## 2018-03-08 DIAGNOSIS — Z992 Dependence on renal dialysis: Secondary | ICD-10-CM | POA: Diagnosis not present

## 2018-03-08 DIAGNOSIS — Z794 Long term (current) use of insulin: Secondary | ICD-10-CM | POA: Diagnosis not present

## 2018-03-09 DIAGNOSIS — N186 End stage renal disease: Secondary | ICD-10-CM | POA: Diagnosis not present

## 2018-03-09 DIAGNOSIS — Z992 Dependence on renal dialysis: Secondary | ICD-10-CM | POA: Diagnosis not present

## 2018-03-10 DIAGNOSIS — N186 End stage renal disease: Secondary | ICD-10-CM | POA: Diagnosis not present

## 2018-03-10 DIAGNOSIS — Z992 Dependence on renal dialysis: Secondary | ICD-10-CM | POA: Diagnosis not present

## 2018-03-11 DIAGNOSIS — R8290 Unspecified abnormal findings in urine: Secondary | ICD-10-CM | POA: Diagnosis not present

## 2018-03-11 DIAGNOSIS — I10 Essential (primary) hypertension: Secondary | ICD-10-CM | POA: Diagnosis not present

## 2018-03-11 DIAGNOSIS — I2510 Atherosclerotic heart disease of native coronary artery without angina pectoris: Secondary | ICD-10-CM | POA: Diagnosis not present

## 2018-03-11 DIAGNOSIS — E1121 Type 2 diabetes mellitus with diabetic nephropathy: Secondary | ICD-10-CM | POA: Diagnosis not present

## 2018-03-11 DIAGNOSIS — G63 Polyneuropathy in diseases classified elsewhere: Secondary | ICD-10-CM | POA: Diagnosis not present

## 2018-03-11 DIAGNOSIS — Z992 Dependence on renal dialysis: Secondary | ICD-10-CM | POA: Diagnosis not present

## 2018-03-11 DIAGNOSIS — Z794 Long term (current) use of insulin: Secondary | ICD-10-CM | POA: Diagnosis not present

## 2018-03-11 DIAGNOSIS — Z0000 Encounter for general adult medical examination without abnormal findings: Secondary | ICD-10-CM | POA: Diagnosis not present

## 2018-03-11 DIAGNOSIS — N186 End stage renal disease: Secondary | ICD-10-CM | POA: Diagnosis not present

## 2018-03-12 ENCOUNTER — Telehealth (INDEPENDENT_AMBULATORY_CARE_PROVIDER_SITE_OTHER): Payer: Self-pay | Admitting: Vascular Surgery

## 2018-03-12 DIAGNOSIS — Z992 Dependence on renal dialysis: Secondary | ICD-10-CM | POA: Diagnosis not present

## 2018-03-12 DIAGNOSIS — N186 End stage renal disease: Secondary | ICD-10-CM | POA: Diagnosis not present

## 2018-03-12 NOTE — Telephone Encounter (Signed)
Returned the patient's call and left a message for a return call.

## 2018-03-13 DIAGNOSIS — N186 End stage renal disease: Secondary | ICD-10-CM | POA: Diagnosis not present

## 2018-03-13 DIAGNOSIS — E113412 Type 2 diabetes mellitus with severe nonproliferative diabetic retinopathy with macular edema, left eye: Secondary | ICD-10-CM | POA: Diagnosis not present

## 2018-03-13 DIAGNOSIS — Z992 Dependence on renal dialysis: Secondary | ICD-10-CM | POA: Diagnosis not present

## 2018-03-14 DIAGNOSIS — Z992 Dependence on renal dialysis: Secondary | ICD-10-CM | POA: Diagnosis not present

## 2018-03-14 DIAGNOSIS — N186 End stage renal disease: Secondary | ICD-10-CM | POA: Diagnosis not present

## 2018-03-15 DIAGNOSIS — E039 Hypothyroidism, unspecified: Secondary | ICD-10-CM | POA: Diagnosis not present

## 2018-03-15 DIAGNOSIS — E782 Mixed hyperlipidemia: Secondary | ICD-10-CM | POA: Diagnosis not present

## 2018-03-15 DIAGNOSIS — I10 Essential (primary) hypertension: Secondary | ICD-10-CM | POA: Diagnosis not present

## 2018-03-15 DIAGNOSIS — Z955 Presence of coronary angioplasty implant and graft: Secondary | ICD-10-CM | POA: Diagnosis not present

## 2018-03-15 DIAGNOSIS — Z992 Dependence on renal dialysis: Secondary | ICD-10-CM | POA: Diagnosis not present

## 2018-03-15 DIAGNOSIS — N186 End stage renal disease: Secondary | ICD-10-CM | POA: Diagnosis not present

## 2018-03-15 DIAGNOSIS — E1122 Type 2 diabetes mellitus with diabetic chronic kidney disease: Secondary | ICD-10-CM | POA: Diagnosis not present

## 2018-03-15 DIAGNOSIS — D649 Anemia, unspecified: Secondary | ICD-10-CM | POA: Diagnosis not present

## 2018-03-15 DIAGNOSIS — Z794 Long term (current) use of insulin: Secondary | ICD-10-CM | POA: Diagnosis not present

## 2018-03-16 DIAGNOSIS — N186 End stage renal disease: Secondary | ICD-10-CM | POA: Diagnosis not present

## 2018-03-16 DIAGNOSIS — Z992 Dependence on renal dialysis: Secondary | ICD-10-CM | POA: Diagnosis not present

## 2018-03-17 DIAGNOSIS — N186 End stage renal disease: Secondary | ICD-10-CM | POA: Diagnosis not present

## 2018-03-17 DIAGNOSIS — Z992 Dependence on renal dialysis: Secondary | ICD-10-CM | POA: Diagnosis not present

## 2018-03-18 DIAGNOSIS — Z992 Dependence on renal dialysis: Secondary | ICD-10-CM | POA: Diagnosis not present

## 2018-03-18 DIAGNOSIS — N186 End stage renal disease: Secondary | ICD-10-CM | POA: Diagnosis not present

## 2018-03-19 DIAGNOSIS — Z992 Dependence on renal dialysis: Secondary | ICD-10-CM | POA: Diagnosis not present

## 2018-03-19 DIAGNOSIS — N186 End stage renal disease: Secondary | ICD-10-CM | POA: Diagnosis not present

## 2018-03-20 DIAGNOSIS — Z992 Dependence on renal dialysis: Secondary | ICD-10-CM | POA: Diagnosis not present

## 2018-03-20 DIAGNOSIS — N186 End stage renal disease: Secondary | ICD-10-CM | POA: Diagnosis not present

## 2018-03-21 DIAGNOSIS — Z992 Dependence on renal dialysis: Secondary | ICD-10-CM | POA: Diagnosis not present

## 2018-03-21 DIAGNOSIS — N186 End stage renal disease: Secondary | ICD-10-CM | POA: Diagnosis not present

## 2018-03-22 DIAGNOSIS — N186 End stage renal disease: Secondary | ICD-10-CM | POA: Diagnosis not present

## 2018-03-22 DIAGNOSIS — Z992 Dependence on renal dialysis: Secondary | ICD-10-CM | POA: Diagnosis not present

## 2018-03-23 DIAGNOSIS — Z992 Dependence on renal dialysis: Secondary | ICD-10-CM | POA: Diagnosis not present

## 2018-03-23 DIAGNOSIS — N186 End stage renal disease: Secondary | ICD-10-CM | POA: Diagnosis not present

## 2018-03-24 DIAGNOSIS — N186 End stage renal disease: Secondary | ICD-10-CM | POA: Diagnosis not present

## 2018-03-24 DIAGNOSIS — Z992 Dependence on renal dialysis: Secondary | ICD-10-CM | POA: Diagnosis not present

## 2018-03-25 DIAGNOSIS — Z992 Dependence on renal dialysis: Secondary | ICD-10-CM | POA: Diagnosis not present

## 2018-03-25 DIAGNOSIS — N186 End stage renal disease: Secondary | ICD-10-CM | POA: Diagnosis not present

## 2018-03-26 DIAGNOSIS — Z992 Dependence on renal dialysis: Secondary | ICD-10-CM | POA: Diagnosis not present

## 2018-03-26 DIAGNOSIS — N186 End stage renal disease: Secondary | ICD-10-CM | POA: Diagnosis not present

## 2018-03-27 DIAGNOSIS — N186 End stage renal disease: Secondary | ICD-10-CM | POA: Diagnosis not present

## 2018-03-27 DIAGNOSIS — Z992 Dependence on renal dialysis: Secondary | ICD-10-CM | POA: Diagnosis not present

## 2018-03-28 DIAGNOSIS — N186 End stage renal disease: Secondary | ICD-10-CM | POA: Diagnosis not present

## 2018-03-28 DIAGNOSIS — Z992 Dependence on renal dialysis: Secondary | ICD-10-CM | POA: Diagnosis not present

## 2018-03-29 DIAGNOSIS — N186 End stage renal disease: Secondary | ICD-10-CM | POA: Diagnosis not present

## 2018-03-29 DIAGNOSIS — Z992 Dependence on renal dialysis: Secondary | ICD-10-CM | POA: Diagnosis not present

## 2018-03-30 DIAGNOSIS — N186 End stage renal disease: Secondary | ICD-10-CM | POA: Diagnosis not present

## 2018-03-30 DIAGNOSIS — Z992 Dependence on renal dialysis: Secondary | ICD-10-CM | POA: Diagnosis not present

## 2018-03-31 DIAGNOSIS — N186 End stage renal disease: Secondary | ICD-10-CM | POA: Diagnosis not present

## 2018-03-31 DIAGNOSIS — Z992 Dependence on renal dialysis: Secondary | ICD-10-CM | POA: Diagnosis not present

## 2018-04-01 DIAGNOSIS — N186 End stage renal disease: Secondary | ICD-10-CM | POA: Diagnosis not present

## 2018-04-01 DIAGNOSIS — Z992 Dependence on renal dialysis: Secondary | ICD-10-CM | POA: Diagnosis not present

## 2018-04-02 DIAGNOSIS — Z794 Long term (current) use of insulin: Secondary | ICD-10-CM | POA: Diagnosis not present

## 2018-04-02 DIAGNOSIS — I509 Heart failure, unspecified: Secondary | ICD-10-CM | POA: Diagnosis not present

## 2018-04-02 DIAGNOSIS — Z4902 Encounter for fitting and adjustment of peritoneal dialysis catheter: Secondary | ICD-10-CM | POA: Diagnosis not present

## 2018-04-02 DIAGNOSIS — K219 Gastro-esophageal reflux disease without esophagitis: Secondary | ICD-10-CM | POA: Diagnosis not present

## 2018-04-02 DIAGNOSIS — Z992 Dependence on renal dialysis: Secondary | ICD-10-CM | POA: Diagnosis not present

## 2018-04-02 DIAGNOSIS — K660 Peritoneal adhesions (postprocedural) (postinfection): Secondary | ICD-10-CM | POA: Diagnosis not present

## 2018-04-02 DIAGNOSIS — T85611A Breakdown (mechanical) of intraperitoneal dialysis catheter, initial encounter: Secondary | ICD-10-CM | POA: Diagnosis not present

## 2018-04-02 DIAGNOSIS — E1122 Type 2 diabetes mellitus with diabetic chronic kidney disease: Secondary | ICD-10-CM | POA: Diagnosis not present

## 2018-04-02 DIAGNOSIS — I132 Hypertensive heart and chronic kidney disease with heart failure and with stage 5 chronic kidney disease, or end stage renal disease: Secondary | ICD-10-CM | POA: Diagnosis not present

## 2018-04-02 DIAGNOSIS — N185 Chronic kidney disease, stage 5: Secondary | ICD-10-CM | POA: Diagnosis not present

## 2018-04-02 DIAGNOSIS — N186 End stage renal disease: Secondary | ICD-10-CM | POA: Diagnosis not present

## 2018-04-03 DIAGNOSIS — Z992 Dependence on renal dialysis: Secondary | ICD-10-CM | POA: Diagnosis not present

## 2018-04-03 DIAGNOSIS — N186 End stage renal disease: Secondary | ICD-10-CM | POA: Diagnosis not present

## 2018-04-04 ENCOUNTER — Encounter: Admit: 2018-04-04 | Discharge: 2018-04-04 | Payer: MEDICARE | Attending: Internal Medicine | Primary: Internal Medicine

## 2018-04-04 ENCOUNTER — Inpatient Hospital Stay
Admission: EM | Admit: 2018-04-04 | Discharge: 2018-04-06 | DRG: 919 | Disposition: A | Discharge status: Other Acute Inpatient Hospital | Payer: Medicare HMO | Attending: Internal Medicine | Admitting: Internal Medicine

## 2018-04-04 ENCOUNTER — Other Ambulatory Visit: Payer: Self-pay

## 2018-04-04 DIAGNOSIS — Z880 Allergy status to penicillin: Secondary | ICD-10-CM

## 2018-04-04 DIAGNOSIS — E1140 Type 2 diabetes mellitus with diabetic neuropathy, unspecified: Secondary | ICD-10-CM | POA: Diagnosis present

## 2018-04-04 DIAGNOSIS — Z7982 Long term (current) use of aspirin: Secondary | ICD-10-CM | POA: Diagnosis not present

## 2018-04-04 DIAGNOSIS — Z955 Presence of coronary angioplasty implant and graft: Secondary | ICD-10-CM

## 2018-04-04 DIAGNOSIS — E7800 Pure hypercholesterolemia, unspecified: Secondary | ICD-10-CM | POA: Diagnosis present

## 2018-04-04 DIAGNOSIS — T85611A Breakdown (mechanical) of intraperitoneal dialysis catheter, initial encounter: Secondary | ICD-10-CM | POA: Diagnosis present

## 2018-04-04 DIAGNOSIS — Z538 Procedure and treatment not carried out for other reasons: Secondary | ICD-10-CM | POA: Diagnosis not present

## 2018-04-04 DIAGNOSIS — I10 Essential (primary) hypertension: Secondary | ICD-10-CM | POA: Diagnosis not present

## 2018-04-04 DIAGNOSIS — Z888 Allergy status to other drugs, medicaments and biological substances status: Secondary | ICD-10-CM

## 2018-04-04 DIAGNOSIS — T82868A Thrombosis of vascular prosthetic devices, implants and grafts, initial encounter: Secondary | ICD-10-CM | POA: Diagnosis not present

## 2018-04-04 DIAGNOSIS — E039 Hypothyroidism, unspecified: Secondary | ICD-10-CM | POA: Diagnosis present

## 2018-04-04 DIAGNOSIS — D631 Anemia in chronic kidney disease: Secondary | ICD-10-CM | POA: Diagnosis present

## 2018-04-04 DIAGNOSIS — I2510 Atherosclerotic heart disease of native coronary artery without angina pectoris: Secondary | ICD-10-CM | POA: Diagnosis present

## 2018-04-04 DIAGNOSIS — Y812 Prosthetic and other implants, materials and accessory general- and plastic-surgery devices associated with adverse incidents: Secondary | ICD-10-CM | POA: Diagnosis present

## 2018-04-04 DIAGNOSIS — T85898A Other specified complication of other internal prosthetic devices, implants and grafts, initial encounter: Secondary | ICD-10-CM | POA: Diagnosis not present

## 2018-04-04 DIAGNOSIS — E1122 Type 2 diabetes mellitus with diabetic chronic kidney disease: Secondary | ICD-10-CM | POA: Diagnosis present

## 2018-04-04 DIAGNOSIS — Z992 Dependence on renal dialysis: Secondary | ICD-10-CM

## 2018-04-04 DIAGNOSIS — I132 Hypertensive heart and chronic kidney disease with heart failure and with stage 5 chronic kidney disease, or end stage renal disease: Secondary | ICD-10-CM | POA: Diagnosis present

## 2018-04-04 DIAGNOSIS — I509 Heart failure, unspecified: Secondary | ICD-10-CM | POA: Diagnosis present

## 2018-04-04 DIAGNOSIS — E119 Type 2 diabetes mellitus without complications: Secondary | ICD-10-CM | POA: Diagnosis not present

## 2018-04-04 DIAGNOSIS — N186 End stage renal disease: Secondary | ICD-10-CM | POA: Diagnosis present

## 2018-04-04 DIAGNOSIS — Z794 Long term (current) use of insulin: Secondary | ICD-10-CM

## 2018-04-04 DIAGNOSIS — Z79899 Other long term (current) drug therapy: Secondary | ICD-10-CM | POA: Diagnosis not present

## 2018-04-04 DIAGNOSIS — N2581 Secondary hyperparathyroidism of renal origin: Secondary | ICD-10-CM | POA: Diagnosis present

## 2018-04-04 DIAGNOSIS — Z4682 Encounter for fitting and adjustment of non-vascular catheter: Secondary | ICD-10-CM | POA: Diagnosis not present

## 2018-04-04 LAB — CBC WITH DIFFERENTIAL/PLATELET
BASOS ABS: 0 10*3/uL (ref 0–0.1)
BASOS PCT: 0 %
Eosinophils Absolute: 0.1 10*3/uL (ref 0–0.7)
Eosinophils Relative: 1 %
HEMATOCRIT: 28.9 % — AB (ref 35.0–47.0)
HEMOGLOBIN: 9.9 g/dL — AB (ref 12.0–16.0)
Lymphocytes Relative: 11 %
Lymphs Abs: 0.9 10*3/uL — ABNORMAL LOW (ref 1.0–3.6)
MCH: 33.6 pg (ref 26.0–34.0)
MCHC: 34.2 g/dL (ref 32.0–36.0)
MCV: 98.3 fL (ref 80.0–100.0)
MONOS PCT: 5 %
Monocytes Absolute: 0.4 10*3/uL (ref 0.2–0.9)
NEUTROS ABS: 6.7 10*3/uL — AB (ref 1.4–6.5)
NEUTROS PCT: 83 %
Platelets: 183 10*3/uL (ref 150–440)
RBC: 2.94 MIL/uL — AB (ref 3.80–5.20)
RDW: 16.1 % — ABNORMAL HIGH (ref 11.5–14.5)
WBC: 8 10*3/uL (ref 3.6–11.0)

## 2018-04-04 LAB — COMPREHENSIVE METABOLIC PANEL
ALK PHOS: 64 U/L (ref 38–126)
ALT: <5 U/L (ref 0–44)
ANION GAP: 18 — AB (ref 5–15)
AST: 16 U/L (ref 15–41)
Albumin: 3.2 g/dL — ABNORMAL LOW (ref 3.5–5.0)
BUN: 85 mg/dL — ABNORMAL HIGH (ref 8–23)
CALCIUM: 7 mg/dL — AB (ref 8.9–10.3)
CO2: 19 mmol/L — AB (ref 22–32)
Chloride: 95 mmol/L — ABNORMAL LOW (ref 98–111)
Creatinine, Ser: 16.85 mg/dL — ABNORMAL HIGH (ref 0.44–1.00)
GFR, EST AFRICAN AMERICAN: 2 mL/min — AB (ref >60)
GFR, EST NON AFRICAN AMERICAN: 2 mL/min — AB (ref >60)
Glucose, Bld: 163 mg/dL — ABNORMAL HIGH (ref 70–99)
Potassium: 4.5 mmol/L (ref 3.5–5.1)
SODIUM: 132 mmol/L — AB (ref 135–145)
Total Bilirubin: 0.6 mg/dL (ref 0.3–1.2)
Total Protein: 5.9 g/dL — ABNORMAL LOW (ref 6.5–8.1)

## 2018-04-04 LAB — GLUCOSE, CAPILLARY
GLUCOSE-CAPILLARY: 107 mg/dL — AB (ref 70–99)
Glucose-Capillary: 132 mg/dL — ABNORMAL HIGH (ref 70–99)

## 2018-04-04 MED ORDER — HYDRALAZINE HCL 50 MG PO TABS
50.0000 mg | ORAL_TABLET | Freq: Two times a day (BID) | ORAL | Status: DC
  Administered 2018-04-04 – 2018-04-05 (×2): 50 mg via ORAL
  Filled 2018-04-04 (×4): qty 1

## 2018-04-04 MED ORDER — CALCIUM CARBONATE ANTACID 500 MG PO CHEW
3000.0000 mg | CHEWABLE_TABLET | Freq: Three times a day (TID) | ORAL | Status: DC
  Filled 2018-04-04: qty 6

## 2018-04-04 MED ORDER — ONDANSETRON HCL 4 MG/2ML IJ SOLN: 4.0000 mg | Freq: Four times a day (QID) | INTRAMUSCULAR | Status: DC | PRN

## 2018-04-04 MED ORDER — LEVOTHYROXINE SODIUM 112 MCG PO TABS
112.0000 ug | ORAL_TABLET | Freq: Every day | ORAL | Status: DC
  Administered 2018-04-05 – 2018-04-06 (×2): 112 ug via ORAL
  Filled 2018-04-04 (×2): qty 1

## 2018-04-04 MED ORDER — POLYSACCHARIDE IRON COMPLEX 150 MG PO CAPS
150.0000 mg | ORAL_CAPSULE | Freq: Every day | ORAL | Status: DC
  Administered 2018-04-05: 150 mg via ORAL
  Filled 2018-04-04 (×2): qty 1

## 2018-04-04 MED ORDER — CALCIUM ACETATE (PHOS BINDER) 667 MG PO CAPS
2001.0000 mg | ORAL_CAPSULE | Freq: Three times a day (TID) | ORAL | Status: DC
  Administered 2018-04-04 – 2018-04-06 (×4): 2001 mg via ORAL
  Filled 2018-04-04 (×6): qty 3

## 2018-04-04 MED ORDER — ACETAMINOPHEN 325 MG PO TABS
650.0000 mg | ORAL_TABLET | Freq: Four times a day (QID) | ORAL | Status: DC | PRN
  Administered 2018-04-05: 650 mg via ORAL
  Filled 2018-04-04: qty 2

## 2018-04-04 MED ORDER — ALTEPLASE 2 MG IJ SOLR
4.0000 mg | Freq: Once | INTRAMUSCULAR | Status: AC
  Administered 2018-04-04: 4 mg
  Filled 2018-04-04: qty 4

## 2018-04-04 MED ORDER — RENA-VITE PO TABS
1.0000 | ORAL_TABLET | Freq: Every day | ORAL | Status: DC
  Administered 2018-04-05: 1 via ORAL
  Filled 2018-04-04 (×2): qty 1

## 2018-04-04 MED ORDER — SODIUM CHLORIDE 0.9 % IV SOLN: 250.0000 mL | INTRAVENOUS | Status: DC | PRN

## 2018-04-04 MED ORDER — SEVELAMER CARBONATE 800 MG PO TABS
1600.0000 mg | ORAL_TABLET | Freq: Three times a day (TID) | ORAL | Status: DC
  Administered 2018-04-04 – 2018-04-06 (×4): 1600 mg via ORAL
  Filled 2018-04-04 (×6): qty 2

## 2018-04-04 MED ORDER — ACETAMINOPHEN 650 MG RE SUPP: 650.0000 mg | Freq: Four times a day (QID) | RECTAL | Status: DC | PRN

## 2018-04-04 MED ORDER — ASPIRIN EC 81 MG PO TBEC
81.0000 mg | DELAYED_RELEASE_TABLET | Freq: Every day | ORAL | Status: DC
  Administered 2018-04-05: 81 mg via ORAL
  Filled 2018-04-04 (×2): qty 1

## 2018-04-04 MED ORDER — AMLODIPINE BESYLATE 5 MG PO TABS
5.0000 mg | ORAL_TABLET | Freq: Every day | ORAL | Status: DC
  Filled 2018-04-04 (×2): qty 1

## 2018-04-04 MED ORDER — SODIUM CHLORIDE 0.9% FLUSH
3.0000 mL | Freq: Two times a day (BID) | INTRAVENOUS | Status: DC
  Administered 2018-04-04 – 2018-04-06 (×4): 3 mL via INTRAVENOUS

## 2018-04-04 MED ORDER — UREA 20 % EX CREA: 1.0000 "application " | TOPICAL_CREAM | Freq: Every day | CUTANEOUS | Status: DC | PRN

## 2018-04-04 MED ORDER — OXYCODONE HCL 5 MG PO TABS: 5.0000 mg | ORAL_TABLET | ORAL | Status: DC | PRN

## 2018-04-04 MED ORDER — TRAMADOL HCL 50 MG PO TABS: 50.0000 mg | ORAL_TABLET | Freq: Three times a day (TID) | ORAL | Status: DC | PRN

## 2018-04-04 MED ORDER — ONDANSETRON HCL 4 MG PO TABS: 4.0000 mg | ORAL_TABLET | Freq: Four times a day (QID) | ORAL | Status: DC | PRN

## 2018-04-04 MED ORDER — PANTOPRAZOLE SODIUM 40 MG PO TBEC
40.0000 mg | DELAYED_RELEASE_TABLET | Freq: Two times a day (BID) | ORAL | Status: DC
  Administered 2018-04-04 – 2018-04-05 (×3): 40 mg via ORAL
  Filled 2018-04-04 (×4): qty 1

## 2018-04-04 MED ORDER — CARVEDILOL 25 MG PO TABS
25.0000 mg | ORAL_TABLET | Freq: Two times a day (BID) | ORAL | Status: DC
  Administered 2018-04-04 – 2018-04-06 (×3): 25 mg via ORAL
  Filled 2018-04-04 (×4): qty 1

## 2018-04-04 MED ORDER — SODIUM CHLORIDE 0.9% FLUSH: 3.0000 mL | INTRAVENOUS | Status: DC | PRN

## 2018-04-04 MED ORDER — INSULIN GLARGINE 100 UNIT/ML ~~LOC~~ SOLN
24.0000 [IU] | Freq: Every day | SUBCUTANEOUS | Status: DC
  Administered 2018-04-04 – 2018-04-05 (×2): 24 [IU] via SUBCUTANEOUS
  Filled 2018-04-04 (×4): qty 0.24

## 2018-04-04 MED ORDER — INSULIN ASPART 100 UNIT/ML ~~LOC~~ SOLN
0.0000 [IU] | Freq: Three times a day (TID) | SUBCUTANEOUS | Status: DC
  Administered 2018-04-04 – 2018-04-05 (×2): 1 [IU] via SUBCUTANEOUS
  Filled 2018-04-04 (×3): qty 1

## 2018-04-04 MED ORDER — AMMONIUM LACTATE 12 % EX LOTN
TOPICAL_LOTION | Freq: Every day | CUTANEOUS | Status: DC | PRN
  Filled 2018-04-04: qty 400

## 2018-04-04 MED ORDER — PRAVASTATIN SODIUM 20 MG PO TABS
20.0000 mg | ORAL_TABLET | Freq: Every day | ORAL | Status: DC
  Administered 2018-04-04 – 2018-04-05 (×2): 20 mg via ORAL
  Filled 2018-04-04 (×3): qty 1

## 2018-04-04 MED ORDER — HEPARIN SODIUM (PORCINE) 5000 UNIT/ML IJ SOLN
5000.0000 [IU] | Freq: Three times a day (TID) | INTRAMUSCULAR | Status: DC
  Administered 2018-04-04 – 2018-04-05 (×3): 5000 [IU] via SUBCUTANEOUS
  Filled 2018-04-04 (×3): qty 1

## 2018-04-04 NOTE — ED Notes (Signed)
Pt to Dialysis at this time 

## 2018-04-04 NOTE — ED Notes (Signed)
Per Dialysis pt can come now for flush of dialysis cath.

## 2018-04-04 NOTE — ED Notes (Signed)
Dialysis will have patient sent to 222 after their procedure.

## 2018-04-04 NOTE — ED Notes (Signed)
Cory Roughen, RN given report and aware that pt will be coming upstairs from dialysis once procedure complete.

## 2018-04-04 NOTE — H&P (Signed)
[facility] [place] NAME: Bethany Lee    MR#:  [id number]  DATE OF BIRTH:  [birth year]  DATE OF ADMISSION:  04/04/2018  PRIMARY CARE PHYSICIAN: Tracie Harrier, MD   REQUESTING/REFERRING PHYSICIAN: Burlene Arnt MD  CHIEF COMPLAINT:   Chief Complaint  Patient presents with  . Vascular Access Problem  . Abdominal Pain    HISTORY OF PRESENT ILLNESS: Bethany Lee  is a 67 y.o. female with a known history of end-stage renal disease who is on peritoneal dialysis, coronary artery disease, diabetes type 2, hypothyroidism who is presenting to the hospital with abdominal discomfort and swelling.  Patient had a vascular procedure done on Tuesday for dysfunction with her peritoneal dialysis catheter patient did peritoneal dialysis yesterday without any difficulty today she is having difficulty with the catheter.  Patient denies any fevers chills no vomiting or diarrhea     PAST MEDICAL HISTORY:   Past Medical History:  Diagnosis Date  . Anemia   . CHF (congestive heart failure) ([place])   . Chronic kidney disease    peritoneal dialysis  . Complication of anesthesia   . Coronary artery disease   . Diabetes mellitus without complication ([place])   . Dialysis patient [facility])    Peritoneal dialysis patient  . Dyspnea    doe  . Edema   . History of recent blood transfusion 01/2017  . Hypercholesterolemia   . Hypertension   . Hypothyroidism   . Neuropathy   . PONV (postoperative nausea and vomiting)   . Shingles    October-November 2017  . Steal syndrome as complication of dialysis access ([place])   . Tremors of nervous system    intermittent when taking gabapentin    PAST SURGICAL HISTORY:  Past Surgical History:  Procedure Laterality Date  . A/V FISTULAGRAM Left 01/16/2017   Procedure: A/V Fistulagram;  Surgeon: Katha Cabal, MD;  Location: [place] [facility];  Service: Cardiovascular;  Laterality: Left;  . A/V SHUNT INTERVENTION N/A 01/16/2017    Procedure: A/V Shunt Intervention;  Surgeon: Katha Cabal, MD;  Location: [place] [facility];  Service: Cardiovascular;  Laterality: N/A;  . ABDOMINAL HYSTERECTOMY    . AV FISTULA PLACEMENT Left 11/30/2016   Procedure: ARTERIOVENOUS (AV) FISTULA CREATION ( EXPLORE FOR CREATION BRACHIOCEPHALIC);  Surgeon: Algernon Huxley, MD;  Location: [facility];  Service: Vascular;  Laterality: Left;  . CAPD INSERTION N/A 11/25/2015   Procedure: LAPAROSCOPIC INSERTION CONTINUOUS AMBULATORY PERITONEAL DIALYSIS  (CAPD) CATHETER;  Surgeon: Algernon Huxley, MD;  Location: [facility];  Service: Vascular;  Laterality: N/A;  . CAPD INSERTION N/A 01/06/2016   Procedure: LAPAROSCOPIC INSERTION CONTINUOUS AMBULATORY PERITONEAL DIALYSIS  (CAPD) CATHETER REVISION ;  Surgeon: Algernon Huxley, MD;  Location: [facility];  Service: Vascular;  Laterality: N/A;  . CAPD INSERTION N/A 10/11/2016   Procedure: LAPAROSCOPIC INSERTION CONTINUOUS AMBULATORY PERITONEAL DIALYSIS  (CAPD) CATHETER ( REVISION );  Surgeon: Algernon Huxley, MD;  Location: [facility];  Service: Vascular;  Laterality: N/A;  . CARDIAC CATHETERIZATION    . CATARACT EXTRACTION W/PHACO Right 05/25/2016   Procedure: CATARACT EXTRACTION PHACO AND INTRAOCULAR LENS PLACEMENT ([facility]);  Surgeon: Eulogio Bear, MD;  Location: [facility];  Service: Ophthalmology;  Laterality: Right;  Lot # [id number] H [place]: 00:5.2 AP%: 11.3 CDE: 7.21  . CATARACT EXTRACTION W/PHACO Left 12/06/2017   Procedure: CATARACT EXTRACTION PHACO AND INTRAOCULAR LENS PLACEMENT ([facility]);  Surgeon: Eulogio Bear, MD;  Location: [facility];  Service: Ophthalmology;  Laterality: Left;  [place] 00.23.4 AP% 7.9 CDE 1.83 Fluid Pack Lot # [id number]  . COLONOSCOPY WITH PROPOFOL N/A 02/15/2017   Procedure: COLONOSCOPY WITH PROPOFOL;  Surgeon: [name], MD;  Location: [facility] ENDOSCOPY;  Service: Gastroenterology;  Laterality: N/A;  . CORONARY ANGIOPLASTY     STENT  . CORONARY STENT INTERVENTION N/A 09/07/2016   Procedure: Coronary Stent  Intervention;  Surgeon: Isaias Cowman, MD;  Location: [place] [facility];  Service: Cardiovascular;  Laterality: N/A;  . DIALYSIS/PERMA CATHETER INSERTION N/A 09/21/2016   Procedure: Dialysis/Perma Catheter Insertion;  Surgeon: Algernon Huxley, MD;  Location: [place] [facility];  Service: Cardiovascular;  Laterality: N/A;  . DIALYSIS/PERMA CATHETER REMOVAL Right 11/30/2016   Procedure: DIALYSIS/PERMA CATHETER REMOVAL;  Surgeon: Algernon Huxley, MD;  Location: [facility];  Service: Vascular;  Laterality: Right;  . ESOPHAGOGASTRODUODENOSCOPY (EGD) WITH PROPOFOL N/A 02/15/2017   Procedure: ESOPHAGOGASTRODUODENOSCOPY (EGD) WITH PROPOFOL;  Surgeon: [name], MD;  Location: [facility] ENDOSCOPY;  Service: Gastroenterology;  Laterality: N/A;  . EYE SURGERY    . GIVENS CAPSULE STUDY N/A 02/26/2017   Procedure: GIVENS CAPSULE STUDY;  Surgeon: [name], MD;  Location: [facility] ENDOSCOPY;  Service: Gastroenterology;  Laterality: N/A;  . LEFT HEART CATH AND CORONARY ANGIOGRAPHY Left 09/07/2016   Procedure: Left Heart Cath and Coronary Angiography;  Surgeon: Isaias Cowman, MD;  Location: [place] [facility];  Service: Cardiovascular;  Laterality: Left;  . LIGATION OF ARTERIOVENOUS  FISTULA Left 02/07/2017   Procedure: LIGATION OF ARTERIOVENOUS  FISTULA ( BANDING BRACHIAL CEPHALIC );  Surgeon: Katha Cabal, MD;  Location: [facility] OR[facility];  Service: Vascular;  Laterality: Left;  . TUBAL LIGATION    . UPPER EXTREMITY ANGIOGRAPHY Left 01/19/2017   Procedure: Upper Extremity Angiography;  Surgeon: Katha Cabal, MD;  Location: [place] [facility];  Service: Cardiovascular;  Laterality: Left;    SOCIAL HISTORY:  Social History   Tobacco Use  . Smoking status: Former Smoker    Last attempt to quit: 10/09/2008    Years since quitting: 9.4  . Smokeless tobacco: Never Used  Substance Use Topics  . Alcohol use: No    FAMILY HISTORY: History reviewed. No pertinent family history.  DRUG ALLERGIES:  Allergies   Allergen Reactions  . Penicillins Other (See Comments)    Childhood loss of consciousness - Pt is not allergic Has patient had a PCN reaction causing immediate rash, facial/tongue/throat swelling, SOB or lightheadedness with hypotension: Yes Has patient had a PCN reaction causing severe rash involving mucus membranes or skin necrosis: No Has patient had a PCN reaction that required hospitalization Unknown Has patient had a PCN reaction occurring within the last 10 years: Unknown If all of the above answers are "NO", then may proceed with Cephalosporin use  . Valacyclovir Nausea Only    REVIEW OF SYSTEMS:   CONSTITUTIONAL: No fever, fatigue or weakness.  EYES: No blurred or double vision.  EARS, NOSE, AND THROAT: No tinnitus or ear pain.  RESPIRATORY: No cough, shortness of breath, wheezing or hemoptysis.  CARDIOVASCULAR: No chest pain, orthopnea, edema.  GASTROINTESTINAL: No nausea, vomiting, diarrhea or abdominal pain.  Abdominal distention GENITOURINARY: No dysuria, hematuria.  ENDOCRINE: No polyuria, nocturia,  HEMATOLOGY: No anemia, easy bruising or bleeding SKIN: No rash or lesion. MUSCULOSKELETAL: No joint pain or arthritis.   NEUROLOGIC: No tingling, numbness, weakness.  PSYCHIATRY: No anxiety or depression.   MEDICATIONS AT HOME:  Prior to Admission medications   Medication Sig Start Date End Date Taking? Authorizing Provider  amLODipine (NORVASC) 5 MG tablet Take 5 mg by mouth daily.   Yes [provider]  aspirin EC 81 MG tablet Take 1 tablet (81 mg total) by mouth daily. Start taking after NOV 15 Patient taking differently: Take 81 mg by mouth daily.  05/26/15  Yes Murlean Iba, MD  calcium acetate (PHOSLO) 667 MG capsule Take 2,001 mg by mouth 3 (three) times daily with meals.   Yes [provider]  calcium carbonate (TUMS EX) 750 MG chewable tablet Chew 4 tablets by mouth 3 (three) times daily with meals.   Yes [provider]  carvedilol  (COREG) 12.5 MG tablet Take 1 tablet (12.5 mg total) by mouth 2 (two) times daily with a meal. Patient taking differently: Take 25 mg by mouth 2 (two) times daily with a meal.  07/20/16  Yes Fritzi Mandes, MD  hydrALAZINE (APRESOLINE) 50 MG tablet Take 50 mg by mouth 2 (two) times daily.   Yes [provider]  insulin aspart (NOVOLOG FLEXPEN) 100 UNIT/ML FlexPen Inject 4-8 Units into the skin 3 (three) times daily with meals. Sliding scale below 200 take 4 units, 200-250=6 units, >250= 8 units 09/13/15  Yes [provider]  Insulin Glargine (TOUJEO SOLOSTAR) 300 UNIT/ML SOPN Inject 20 Units into the skin at bedtime. Patient taking differently: Inject 24 Units into the skin at bedtime.  12/21/15  Yes Theodoro Grist, MD  iron polysaccharides (NIFEREX) 150 MG capsule Take 150 mg by mouth daily.   Yes [provider]  levothyroxine (SYNTHROID, LEVOTHROID) 112 MCG tablet Take 112 mcg by mouth daily before breakfast.    Yes [provider]  lovastatin (MEVACOR) 40 MG tablet Take 40 mg by mouth at bedtime.   Yes [provider]  Multiple Vitamins-Minerals (DIALYVITE 800/ULTRA D) TABS Take 1 tablet by mouth daily.   Yes [provider]  pantoprazole (PROTONIX) 40 MG tablet Take 1 tablet (40 mg total) by mouth 2 (two) times daily. 01/31/17  Yes Max Sane, MD  sevelamer carbonate (RENVELA) 800 MG tablet Take 1,600 mg by mouth 3 (three) times daily with meals.   Yes [provider]  urea (CARMOL) 20 % cream Apply 1 application topically daily as needed (for dry skin).   Yes [provider]  gentamicin cream (GARAMYCIN) 0.1 % Apply 1 application topically daily. Patient not taking: Reported on 12/03/2017 12/21/15   Theodoro Grist, MD  traMADol (ULTRAM) 50 MG tablet Take 1 tablet (50 mg total) by mouth every 8 (eight) hours as needed for moderate pain or severe pain. Patient not taking: Reported on 12/03/2017 02/07/17   Schnier, Dolores Lory, MD       PHYSICAL EXAMINATION:   VITAL SIGNS: Blood pressure (!) 154/63, pulse 72, temperature 98 F (36.7 C), temperature source Oral, resp. rate 18, height 5' (1.524 m), weight 76 kg, SpO2 96 %.  GENERAL:  67 y.o.-year-old patient lying in the bed with no acute distress.  EYES: Pupils equal, round, reactive to light and accommodation. No scleral icterus. Extraocular muscles intact.  HEENT: Head atraumatic, normocephalic. Oropharynx and nasopharynx clear.  NECK:  Supple, no jugular venous distention. No thyroid enlargement, no tenderness.  LUNGS: Normal breath sounds bilaterally, no wheezing, rales,rhonchi or crepitation. No use of accessory muscles of respiration.  CARDIOVASCULAR: S1, S2 normal. No murmurs, rubs, or gallops.  ABDOMEN: Soft, nontender, mild distention. Bowel sounds present. No organomegaly or mass.  EXTREMITIES: No pedal edema, cyanosis, or clubbing.  NEUROLOGIC: Cranial nerves II through XII are intact.  Muscle strength 5/5 in all extremities. Sensation intact. Gait not checked.  PSYCHIATRIC: The patient is alert and oriented x 3.  SKIN: No obvious rash, lesion, or ulcer.   LABORATORY PANEL:   CBC Recent Labs  Lab 04/04/18 1341  WBC 8.0  HGB 9.9*  HCT 28.9*  PLT 183  MCV 98.3  MCH 33.6  MCHC 34.2  RDW 16.1*  LYMPHSABS 0.9*  MONOABS 0.4  EOSABS 0.1  BASOSABS 0.0   ------------------------------------------------------------------------------------------------------------------  Chemistries  Recent Labs  Lab 04/04/18 1341  NA 132*  K 4.5  CL 95*  CO2 19*  GLUCOSE 163*  BUN 85*  CREATININE 16.85*  CALCIUM 7.0*  AST 16  ALT <5  ALKPHOS 64  BILITOT 0.6   ------------------------------------------------------------------------------------------------------------------ estimated creatinine clearance is 2.9 mL/min (A) (by C-G formula based on SCr of 16.85 mg/dL  (H)). ------------------------------------------------------------------------------------------------------------------ No results for input(s): TSH, T4TOTAL, T3FREE, THYROIDAB in the last 72 hours.  Invalid input(s): FREET3   Coagulation profile No results for input(s): INR, PROTIME in the last 168 hours. ------------------------------------------------------------------------------------------------------------------- No results for input(s): DDIMER in the last 72 hours. -------------------------------------------------------------------------------------------------------------------  Cardiac Enzymes No results for input(s): CKMB, TROPONINI, MYOGLOBIN in the last 168 hours.  Invalid input(s): CK ------------------------------------------------------------------------------------------------------------------ Invalid input(s): POCBNP  ---------------------------------------------------------------------------------------------------------------  Urinalysis    Component Value Date/Time   COLORURINE STRAW (A) 05/04/2016 1420   APPEARANCEUR CLEAR (A) 05/04/2016 1420   LABSPEC 1.011 05/04/2016 1420   PHURINE 5.0 05/04/2016 1420   GLUCOSEU 150 (A) 05/04/2016 1420   HGBUR 1+ (A) 05/04/2016 1420   BILIRUBINUR NEGATIVE 05/04/2016 1420   KETONESUR NEGATIVE 05/04/2016 1420   PROTEINUR >500 (A) 05/04/2016 1420   NITRITE NEGATIVE 05/04/2016 1420   LEUKOCYTESUR NEGATIVE 05/04/2016 1420     RADIOLOGY: No results found.  EKG: Orders placed or performed during the hospital encounter of 11/27/16  . EKG test  . EKG test    IMPRESSION AND PLAN: Patient is a 67 year old end-stage renal disease patient on peritoneal dialysis  1.  Peritoneal dialysis dysfunction Nephrology and vascular surgery have been consulted  2.  Essential hypertension continue Norvasc and hydralazine  3.  Diabetes type 2 Place on sliding scale continue Lantus  4.  Hypothyroidism continue Synthroid  5.   Hyperlipidemia continue Mevacor  6.  Miscellaneous heparin for DVT prophylaxis   All the records are reviewed and case discussed with ED provider. Management plans discussed with the patient, family and they are in agreement.  CODE STATUS: Code Status History    Date Active Date Inactive Code Status Order ID Comments User Context   02/10/2017 2014 02/16/2017 1459 Full Code [id number]  Bettey Costa, MD Inpatient   01/29/2017 2149 01/31/2017 1612 Full Code [id number]  Harvie Bridge, DO Inpatient   01/19/2017 1140 01/19/2017 1843 Full Code [id number]  Katha Cabal, MD Inpatient   09/21/2016 2335 09/23/2016 2010 Full Code [id number]  Holley Raring, NP ED   09/07/2016 1241 09/08/2016 1424 Full Code [id number]  Isaias Cowman, MD Inpatient   07/16/2016 1215 07/20/2016 1445 Full Code [id number]  Wilhelmina Mcardle, MD ED   12/19/2015 1416 12/21/2015 2057 Full Code [id number]  Fritzi Mandes, MD Inpatient       TOTAL TIME TAKING CARE OF THIS PATIENT: 44minutes.    Dustin Flock M.D on 04/04/2018 at 4:20 PM  Between 7am to 6pm - Pager - [phone]  After 6pm go to [email or web address] - password [email or web address]  [facility] Office  [phone]  CC: Primary care physician; Tracie Harrier, MD

## 2018-04-04 NOTE — ED Triage Notes (Signed)
Pt states she receives peritoneal dialysis. States she thinks there is fluid in abd, asking if Dr. Holley Raring is working. State dialysis center couldn't get fluid out, states catheter isn't working. On Tuesday states she had surgery to re-psotion catheter. Tried to do dialysis last night per husband but it didn't work. Pt states she feels like she's pregnant. Husband states 14,000 ml still in stomach.

## 2018-04-04 NOTE — Plan of Care (Addendum)
Pt's PD catheter was able to flush without complication. Unable to drian fluid, pt c/o pain in pelvic region when drain was attempted. Pt stated that she has not had a bowel movement since Monday. Pt stated that she went to her dialysis center where they attempted a heparinized fluid exchange. The PD RN's attempt was unsuccessful and she was told to come to the ED . MD aware, MD checking in with surgeon now, I am awaiting further instruction/orders before sending pt back to ED.

## 2018-04-04 NOTE — Progress Notes (Signed)
[facility]  ROUNDING NOTE   Subjective:  Patient well-known to Bethany Lee. She just had peritoneal dialysis catheter revision on April 02, 2018 at [facility] However she is now having trouble with PD catheter drainage. Fluid is able to readily flushing but does not drain very well at all. We instilled 60 cc of normal saline easily into the peritoneal cavity. However despite the patient sitting up we encountered significant difficulties with drainage. Case was discussed with Dr. Raul Del who revised the catheter.   We now plan to instill Activase into the catheter. Also it has been 3 days since she has had a bowel movement.   Objective:  Vital signs in last 24 hours:  Temp:  [98 F (36.7 C)] 98 F (36.7 C) (09/12 1230) Pulse Rate:  [66-72] 72 (09/12 1504) Resp:  [18] 18 (09/12 1504) BP: (141-154)/(61-63) 154/63 (09/12 1504) SpO2:  [96 %-99 %] 96 % (09/12 1504) Weight:  [76 kg] 76 kg (09/12 1230)  Weight change:  Filed Weights   04/04/18 1230  Weight: 76 kg    Intake/Output: No intake/output data recorded.   Intake/Output this shift:  No intake/output data recorded.  Physical Exam: General: No acute distress  Head: Normocephalic, atraumatic. Moist oral mucosal membranes  Eyes: Anicteric  Neck: Supple, trachea midline  Lungs:  Clear to auscultation, normal effort  Heart: S1S2 no rubs  Abdomen:  Soft, distended, nontender  Extremities: 1+ peripheral edema.  Neurologic: Awake, alert, following commands  Skin: No lesions  Access: Peritoneal dialysis catheter in place    Basic Metabolic Panel: Recent Labs  Lab 04/04/18 1341  NA 132*  K 4.5  CL 95*  CO2 19*  GLUCOSE 163*  BUN 85*  CREATININE 16.85*  CALCIUM 7.0*    Liver Function Tests: Recent Labs  Lab 04/04/18 1341  AST 16  ALT <5  ALKPHOS 64  BILITOT 0.6  PROT 5.9*  ALBUMIN 3.2*   No results for input(s): LIPASE, AMYLASE in the last 168 hours. No results for input(s):  AMMONIA in the last 168 hours.  CBC: Recent Labs  Lab 04/04/18 1341  WBC 8.0  NEUTROABS 6.7*  HGB 9.9*  HCT 28.9*  MCV 98.3  PLT 183    Cardiac Enzymes: No results for input(s): CKTOTAL, CKMB, CKMBINDEX, TROPONINI in the last 168 hours.  BNP: Invalid input(s): POCBNP  CBG: No results for input(s): GLUCAP in the last 168 hours.  Microbiology: Results for orders placed or performed during the hospital encounter of 02/10/17  MRSA PCR Screening     Status: None   Collection Time: 02/10/17  8:20 PM  Result Value Ref Range Status   MRSA by PCR NEGATIVE NEGATIVE Final    Comment:        The GeneXpert MRSA Assay (FDA approved for NASAL specimens only), is one component of a comprehensive MRSA colonization surveillance program. It is not intended to diagnose MRSA infection nor to guide or monitor treatment for MRSA infections.     Coagulation Studies: No results for input(s): LABPROT, INR in the last 72 hours.  Urinalysis: No results for input(s): COLORURINE, LABSPEC, PHURINE, GLUCOSEU, HGBUR, BILIRUBINUR, KETONESUR, PROTEINUR, UROBILINOGEN, NITRITE, LEUKOCYTESUR in the last 72 hours.  Invalid input(s): APPERANCEUR    Imaging: No results found.   Medications:    . insulin aspart  0-9 Units Subcutaneous TID WC     Assessment/ Plan:  67 y.o. female 67 y.o. [place] female with end-stage renal disease on peritoneal dialysis, congestive heart failure, coronary  disease, diabetes, hypertension, hyperlipidemia, hypothyroidism, history of GI bleed  Dialysis: [facility] [address]: [facility] CCPD 5 x 2.25Litres ; tidal 85%  1.  ESRD on PD/PD catheter dysfunction.  Patient with PD catheter dysfunction.  Peritoneal dialysis catheter was just revised on April 02, 2018.  PD fluid instilling easily but significant difficulties noted with drainage despite patient in an upright position.  At this point in time we will instill 4 mg of Activase into the peritoneal dialysis  catheter.  Hopefully this will take care of any potential fibrin issue.  In addition we will plan to administer MiraLAX to the patient as she has not had a bowel movement in several days.  Hopefully these actions will reestablish patency to the peritoneal dialysis catheter.  If not we will likely need to transition the patient back to hemodialysis temporarily and refer the patient back to the surgeon for additional input.  2. Anemia of CKD:  Hgb 9.9 at the moment, hold off on epogen for now.   3. Secondary hyperparathyroidism with hypocalcemia. Continue to periodically check phosphorus.   4.  Hypertension:  Medication reconcilation still to be completed.    LOS: 0 Bethany Lee 9/12/20194:09 PM

## 2018-04-04 NOTE — Plan of Care (Signed)
Spoke with RN, Coralie Common, pt is to be brought down to the dialysis unit to flush PD access per MD orders.

## 2018-04-04 NOTE — Progress Notes (Signed)
Report given to [name], RN, RN aware that pt is on her way to the floor.

## 2018-04-04 NOTE — Plan of Care (Signed)
Pt received 91ml of TPA in her PD catheter per MD orders. TPA is to dwell for a minimum of 8 hours. Access will be reassessed first thing in the morning. Pt will be transferred directly to her room from dialysis unit now.

## 2018-04-05 ENCOUNTER — Observation Stay: Payer: Medicare HMO

## 2018-04-05 DIAGNOSIS — Z4682 Encounter for fitting and adjustment of non-vascular catheter: Secondary | ICD-10-CM | POA: Diagnosis not present

## 2018-04-05 DIAGNOSIS — T82868A Thrombosis of vascular prosthetic devices, implants and grafts, initial encounter: Secondary | ICD-10-CM

## 2018-04-05 DIAGNOSIS — I2510 Atherosclerotic heart disease of native coronary artery without angina pectoris: Secondary | ICD-10-CM | POA: Diagnosis not present

## 2018-04-05 DIAGNOSIS — E039 Hypothyroidism, unspecified: Secondary | ICD-10-CM | POA: Diagnosis not present

## 2018-04-05 DIAGNOSIS — E119 Type 2 diabetes mellitus without complications: Secondary | ICD-10-CM | POA: Diagnosis not present

## 2018-04-05 DIAGNOSIS — Z992 Dependence on renal dialysis: Secondary | ICD-10-CM | POA: Diagnosis not present

## 2018-04-05 DIAGNOSIS — N186 End stage renal disease: Secondary | ICD-10-CM | POA: Diagnosis not present

## 2018-04-05 DIAGNOSIS — I10 Essential (primary) hypertension: Secondary | ICD-10-CM | POA: Diagnosis not present

## 2018-04-05 DIAGNOSIS — N2581 Secondary hyperparathyroidism of renal origin: Secondary | ICD-10-CM | POA: Diagnosis not present

## 2018-04-05 DIAGNOSIS — D631 Anemia in chronic kidney disease: Secondary | ICD-10-CM | POA: Diagnosis not present

## 2018-04-05 LAB — COMPREHENSIVE METABOLIC PANEL
ALBUMIN: 3 g/dL — AB (ref 3.5–5.0)
ALT: <5 U/L (ref 0–44)
ANION GAP: 17 — AB (ref 5–15)
AST: 12 U/L — ABNORMAL LOW (ref 15–41)
Alkaline Phosphatase: 63 U/L (ref 38–126)
BUN: 94 mg/dL — ABNORMAL HIGH (ref 8–23)
CALCIUM: 7 mg/dL — AB (ref 8.9–10.3)
CHLORIDE: 96 mmol/L — AB (ref 98–111)
CO2: 19 mmol/L — AB (ref 22–32)
Creatinine, Ser: 17.74 mg/dL — ABNORMAL HIGH (ref 0.44–1.00)
GFR calc non Af Amer: 2 mL/min — ABNORMAL LOW (ref >60)
GFR, EST AFRICAN AMERICAN: 2 mL/min — AB (ref >60)
GLUCOSE: 89 mg/dL (ref 70–99)
POTASSIUM: 4.7 mmol/L (ref 3.5–5.1)
SODIUM: 132 mmol/L — AB (ref 135–145)
Total Bilirubin: 0.6 mg/dL (ref 0.3–1.2)
Total Protein: 5.3 g/dL — ABNORMAL LOW (ref 6.5–8.1)

## 2018-04-05 LAB — MRSA PCR SCREENING: MRSA by PCR: NEGATIVE

## 2018-04-05 LAB — GLUCOSE, CAPILLARY
GLUCOSE-CAPILLARY: 82 mg/dL (ref 70–99)
GLUCOSE-CAPILLARY: 92 mg/dL (ref 70–99)
GLUCOSE-CAPILLARY: 84 mg/dL (ref 70–99)
Glucose-Capillary: 128 mg/dL — ABNORMAL HIGH (ref 70–99)

## 2018-04-05 LAB — PHOSPHORUS: Phosphorus: 9 mg/dL — ABNORMAL HIGH (ref 2.5–4.6)

## 2018-04-05 MED ORDER — PENTAFLUOROPROP-TETRAFLUOROETH EX AERO
1.0000 "application " | INHALATION_SPRAY | CUTANEOUS | Status: DC | PRN
  Filled 2018-04-05: qty 30

## 2018-04-05 MED ORDER — ALTEPLASE 2 MG IJ SOLR: 2.0000 mg | Freq: Once | INTRAMUSCULAR | Status: DC | PRN

## 2018-04-05 MED ORDER — SODIUM CHLORIDE 0.9 % IV SOLN: 100.0000 mL | INTRAVENOUS | Status: DC | PRN

## 2018-04-05 MED ORDER — CHLORHEXIDINE GLUCONATE CLOTH 2 % EX PADS: 6.0000 | MEDICATED_PAD | Freq: Every day | CUTANEOUS | Status: DC

## 2018-04-05 MED ORDER — LIDOCAINE-PRILOCAINE 2.5-2.5 % EX CREA
1.0000 "application " | TOPICAL_CREAM | CUTANEOUS | Status: DC | PRN
  Filled 2018-04-05: qty 5

## 2018-04-05 MED ORDER — POLYETHYLENE GLYCOL 3350 17 G PO PACK
17.0000 g | PACK | Freq: Two times a day (BID) | ORAL | Status: DC
  Administered 2018-04-05: 17 g via ORAL
  Filled 2018-04-05 (×3): qty 1

## 2018-04-05 MED ORDER — HEPARIN SODIUM (PORCINE) 1000 UNIT/ML DIALYSIS
1000.0000 [IU] | INTRAMUSCULAR | Status: DC | PRN
  Filled 2018-04-05: qty 1

## 2018-04-05 MED ORDER — LIDOCAINE HCL (PF) 1 % IJ SOLN
5.0000 mL | INTRAMUSCULAR | Status: DC | PRN
  Filled 2018-04-05: qty 5

## 2018-04-05 NOTE — Progress Notes (Signed)
[facility]  ROUNDING NOTE   Subjective:  Activase was dwelled within the catheter overnight. Dialysis nurse has begun the drain process but it is still sluggish. Patient has been hooked up to a drain bag and we will see over the next hour how well the patient drains.   Objective:  Vital signs in last 24 hours:  Temp:  [98 F (36.7 C)-98.6 F (37 C)] 98.6 F (37 C) (09/13 0442) Pulse Rate:  [65-72] 66 (09/13 0442) Resp:  [18] 18 (09/13 0442) BP: (139-154)/(50-90) 139/50 (09/13 0442) SpO2:  [96 %-100 %] 96 % (09/13 0442) Weight:  [76 kg] 76 kg (09/12 1230)  Weight change:  Filed Weights   04/04/18 1230  Weight: 76 kg    Intake/Output: I/O last 3 completed shifts: In: 40 [P.O.:40] Out: -    Intake/Output this shift:  No intake/output data recorded.  Physical Exam: General: No acute distress  Head: Normocephalic, atraumatic. Moist oral mucosal membranes  Eyes: Anicteric  Neck: Supple, trachea midline  Lungs:  Clear to auscultation, normal effort  Heart: S1S2 no rubs  Abdomen:  Soft, distended, nontender  Extremities: 1+ peripheral edema.  Neurologic: Awake, alert, following commands  Skin: No lesions  Access: Peritoneal dialysis catheter in place    Basic Metabolic Panel: Recent Labs  Lab 04/04/18 1341 04/05/18 0354  NA 132* 132*  K 4.5 4.7  CL 95* 96*  CO2 19* 19*  GLUCOSE 163* 89  BUN 85* 94*  CREATININE 16.85* 17.74*  CALCIUM 7.0* 7.0*    Liver Function Tests: Recent Labs  Lab 04/04/18 1341 04/05/18 0354  AST 16 12*  ALT <5 <5  ALKPHOS 64 63  BILITOT 0.6 0.6  PROT 5.9* 5.3*  ALBUMIN 3.2* 3.0*   No results for input(s): LIPASE, AMYLASE in the last 168 hours. No results for input(s): AMMONIA in the last 168 hours.  CBC: Recent Labs  Lab 04/04/18 1341  WBC 8.0  NEUTROABS 6.7*  HGB 9.9*  HCT 28.9*  MCV 98.3  PLT 183    Cardiac Enzymes: No results for input(s): CKTOTAL, CKMB, CKMBINDEX, TROPONINI in the last 168  hours.  BNP: Invalid input(s): POCBNP  CBG: Recent Labs  Lab 04/04/18 1821 04/04/18 2143 04/05/18 0748  GLUCAP 132* 107* 82    Microbiology: Results for orders placed or performed during the hospital encounter of 02/10/17  MRSA PCR Screening     Status: None   Collection Time: 02/10/17  8:20 PM  Result Value Ref Range Status   MRSA by PCR NEGATIVE NEGATIVE Final    Comment:        The GeneXpert MRSA Assay (FDA approved for NASAL specimens only), is one component of a comprehensive MRSA colonization surveillance program. It is not intended to diagnose MRSA infection nor to guide or monitor treatment for MRSA infections.     Coagulation Studies: No results for input(s): LABPROT, INR in the last 72 hours.  Urinalysis: No results for input(s): COLORURINE, LABSPEC, PHURINE, GLUCOSEU, HGBUR, BILIRUBINUR, KETONESUR, PROTEINUR, UROBILINOGEN, NITRITE, LEUKOCYTESUR in the last 72 hours.  Invalid input(s): APPERANCEUR    Imaging: No results found.   Medications:   . sodium chloride     . amLODipine  5 mg Oral Daily  . aspirin EC  81 mg Oral Daily  . calcium acetate  2,001 mg Oral TID WC  . calcium carbonate  3,000 mg Oral TID WC  . carvedilol  25 mg Oral BID WC  . heparin  5,000 Units Subcutaneous Q8H  .  hydrALAZINE  50 mg Oral BID  . insulin aspart  0-9 Units Subcutaneous TID WC  . insulin glargine  24 Units Subcutaneous QHS  . iron polysaccharides  150 mg Oral Daily  . levothyroxine  112 mcg Oral QAC breakfast  . multivitamin  1 tablet Oral Daily  . pantoprazole  40 mg Oral BID  . polyethylene glycol  17 g Oral BID  . pravastatin  20 mg Oral q1800  . sevelamer carbonate  1,600 mg Oral TID WC  . sodium chloride flush  3 mL Intravenous Q12H     Assessment/ Plan:  67 y.o. female 66 y.o. [place] female with end-stage renal disease on peritoneal dialysis, congestive heart failure, coronary disease, diabetes, hypertension, hyperlipidemia, hypothyroidism, history  of GI bleed  Dialysis: [facility] [address]: [facility] CCPD 5 x 2.25Litres ; tidal 85%  1.  ESRD on PD/PD catheter dysfunction.  Patient with PD catheter dysfunction.  Peritoneal dialysis catheter was just revised on April 02, 2018.  PD fluid instilling easily but significant difficulties noted with drainage despite patient in an upright position.   -4 mg of Activase was instilled within the catheter overnight.  Initial drain continues to be a bit sluggish however we have set up the patient on a drainage bag.  We will see how well she drains over the next hour.  Thereafter we may consider placing her temporarily on the cycler to see how well the drains work.  If we continue to have significant issues however she may need additional revision of the peritoneal dialysis catheter early next week at [facility].  2. Anemia of CKD: She receives Epogen as an outpatient.  3. Secondary hyperparathyroidism with hypocalcemia. Continue to periodically check phosphorus.   4.  Hypertension: Continue amlodipine, carvedilol, and hydralazine.   LOS: 0 Bethany Lee 9/13/20199:38 AM

## 2018-04-05 NOTE — Progress Notes (Signed)
Attempted to cannulate left AVF with 17 gauge needles.  An almost immediate extravasation of the venous aspect was visualize and needle was removed to prevent damage to the fistula.   Another attempt to drain her peritoneum was made but again could not get adequate drain flow.  Will send patient back to her room and notify Dr Holley Raring.

## 2018-04-05 NOTE — Care Management Obs Status (Signed)
MEDICARE OBSERVATION STATUS NOTIFICATION   Patient Details  Name: Bethany Lee MRN: [id number] Date of Birth: 15-Jan-1951   Medicare Observation Status Notification Given:  No Attempted review Medicare Observation Letter with husband.  Husband became very agitated and cursing at this [facility] when attempted to review this form.   Husband states you've got this all wrong, she will be changed from observation.  He then asked for clarification on observation status. RNCM attempted to once again explain and he again became very agitated and yelling.  At that time husband walked away and returned to patient's room.      Beverly Sessions, RN 04/05/2018, 3:51 PM

## 2018-04-05 NOTE — Progress Notes (Signed)
Manual exchange attempted with  Approx. 40cc pink effluent produced.  I have performed a transfer set change just to make sure this was not our problem.  I have informed Dr. Holley Raring that the catheter is still not flowing properly. It seems that fluid will go in but is not draining.

## 2018-04-05 NOTE — Consult Note (Signed)
[facility] Vascular Consult Note  MRN : [id number]  Bethany Lee is a 67 y.o. (Oct 11, 1950) female who presents with chief complaint of  Chief Complaint  Patient presents with  . Vascular Access Problem  . Abdominal Pain  .  History of Present Illness:   I am asked to evaluate the patient by Dr. Posey Pronto.  The patient is a 67 year old woman who is typically maintained on peritoneal dialysis.  She presented to [facility] yesterday because the catheter was functioning poorly.  Catheter function deteriorated abruptly.  She denies pain with dialysis.  There is no fever and chills with dialysis.  They have performed a TPA infusion but function remains inadequate.  Current Meds  Medication Sig  . amLODipine (NORVASC) 5 MG tablet Take 5 mg by mouth daily.  Marland Kitchen aspirin EC 81 MG tablet Take 1 tablet (81 mg total) by mouth daily. Start taking after NOV 15 (Patient taking differently: Take 81 mg by mouth daily. )  . calcium acetate (PHOSLO) 667 MG capsule Take 2,001 mg by mouth 3 (three) times daily with meals.  . calcium carbonate (TUMS EX) 750 MG chewable tablet Chew 4 tablets by mouth 3 (three) times daily with meals.  . carvedilol (COREG) 12.5 MG tablet Take 1 tablet (12.5 mg total) by mouth 2 (two) times daily with a meal. (Patient taking differently: Take 25 mg by mouth 2 (two) times daily with a meal. )  . hydrALAZINE (APRESOLINE) 50 MG tablet Take 50 mg by mouth 2 (two) times daily.  . insulin aspart (NOVOLOG FLEXPEN) 100 UNIT/ML FlexPen Inject 4-8 Units into the skin 3 (three) times daily with meals. Sliding scale below 200 take 4 units, 200-250=6 units, >250= 8 units  . Insulin Glargine (TOUJEO SOLOSTAR) 300 UNIT/ML SOPN Inject 20 Units into the skin at bedtime. (Patient taking differently: Inject 24 Units into the skin at bedtime. )  . iron polysaccharides (NIFEREX) 150 MG capsule Take 150 mg by mouth daily.  Marland Kitchen levothyroxine (SYNTHROID,  LEVOTHROID) 112 MCG tablet Take 112 mcg by mouth daily before breakfast.   . lovastatin (MEVACOR) 40 MG tablet Take 40 mg by mouth at bedtime.  . Multiple Vitamins-Minerals (DIALYVITE 800/ULTRA D) TABS Take 1 tablet by mouth daily.  . pantoprazole (PROTONIX) 40 MG tablet Take 1 tablet (40 mg total) by mouth 2 (two) times daily.  . sevelamer carbonate (RENVELA) 800 MG tablet Take 1,600 mg by mouth 3 (three) times daily with meals.  . urea (CARMOL) 20 % cream Apply 1 application topically daily as needed (for dry skin).    Past Medical History:  Diagnosis Date  . Anemia   . CHF (congestive heart failure) ([place])   . Chronic kidney disease    peritoneal dialysis  . Complication of anesthesia   . Coronary artery disease   . Diabetes mellitus without complication ([place])   . Dialysis patient [facility])    Peritoneal dialysis patient  . Dyspnea    doe  . Edema   . History of recent blood transfusion 01/2017  . Hypercholesterolemia   . Hypertension   . Hypothyroidism   . Neuropathy   . PONV (postoperative nausea and vomiting)   . Shingles    October-November 2017  . Steal syndrome as complication of dialysis access ([place])   . Tremors of nervous system    intermittent when taking gabapentin    Past Surgical History:  Procedure Laterality Date  . A/V FISTULAGRAM Left 01/16/2017  Procedure: A/V Fistulagram;  Surgeon: Katha Cabal, MD;  Location: [place] CV LAB;  Service: Cardiovascular;  Laterality: Left;  . A/V SHUNT INTERVENTION N/A 01/16/2017   Procedure: A/V Shunt Intervention;  Surgeon: Katha Cabal, MD;  Location: [place] CV LAB;  Service: Cardiovascular;  Laterality: N/A;  . ABDOMINAL HYSTERECTOMY    . AV FISTULA PLACEMENT Left 11/30/2016   Procedure: ARTERIOVENOUS (AV) FISTULA CREATION ( EXPLORE FOR CREATION BRACHIOCEPHALIC);  Surgeon: Algernon Huxley, MD;  Location: [facility] ORS;  Service: Vascular;  Laterality: Left;  . CAPD INSERTION N/A 11/25/2015   Procedure:  LAPAROSCOPIC INSERTION CONTINUOUS AMBULATORY PERITONEAL DIALYSIS  (CAPD) CATHETER;  Surgeon: Algernon Huxley, MD;  Location: [facility] ORS;  Service: Vascular;  Laterality: N/A;  . CAPD INSERTION N/A 01/06/2016   Procedure: LAPAROSCOPIC INSERTION CONTINUOUS AMBULATORY PERITONEAL DIALYSIS  (CAPD) CATHETER REVISION ;  Surgeon: Algernon Huxley, MD;  Location: [facility] ORS;  Service: Vascular;  Laterality: N/A;  . CAPD INSERTION N/A 10/11/2016   Procedure: LAPAROSCOPIC INSERTION CONTINUOUS AMBULATORY PERITONEAL DIALYSIS  (CAPD) CATHETER ( REVISION );  Surgeon: Algernon Huxley, MD;  Location: [facility] ORS;  Service: Vascular;  Laterality: N/A;  . CARDIAC CATHETERIZATION    . CATARACT EXTRACTION W/PHACO Right 05/25/2016   Procedure: CATARACT EXTRACTION PHACO AND INTRAOCULAR LENS PLACEMENT (IOC);  Surgeon: Eulogio Bear, MD;  Location: [facility] ORS;  Service: Ophthalmology;  Laterality: Right;  Lot # [id number] H [place]: 00:5.2 AP%: 11.3 CDE: 7.21  . CATARACT EXTRACTION W/PHACO Left 12/06/2017   Procedure: CATARACT EXTRACTION PHACO AND INTRAOCULAR LENS PLACEMENT (IOC);  Surgeon: Eulogio Bear, MD;  Location: [facility] ORS;  Service: Ophthalmology;  Laterality: Left;  [place] 00.23.4 AP% 7.9 CDE 1.83 Fluid Pack Lot # [id number]  . COLONOSCOPY WITH PROPOFOL N/A 02/15/2017   Procedure: COLONOSCOPY WITH PROPOFOL;  Surgeon: [name], MD;  Location: [facility] ENDOSCOPY;  Service: Gastroenterology;  Laterality: N/A;  . CORONARY ANGIOPLASTY     STENT  . CORONARY STENT INTERVENTION N/A 09/07/2016   Procedure: Coronary Stent Intervention;  Surgeon: Isaias Cowman, MD;  Location: [place] CV LAB;  Service: Cardiovascular;  Laterality: N/A;  . DIALYSIS/PERMA CATHETER INSERTION N/A 09/21/2016   Procedure: Dialysis/Perma Catheter Insertion;  Surgeon: Algernon Huxley, MD;  Location: [place] CV LAB;  Service: Cardiovascular;  Laterality: N/A;  . DIALYSIS/PERMA CATHETER REMOVAL Right 11/30/2016   Procedure: DIALYSIS/PERMA CATHETER REMOVAL;  Surgeon:  Algernon Huxley, MD;  Location: [facility] ORS;  Service: Vascular;  Laterality: Right;  . ESOPHAGOGASTRODUODENOSCOPY (EGD) WITH PROPOFOL N/A 02/15/2017   Procedure: ESOPHAGOGASTRODUODENOSCOPY (EGD) WITH PROPOFOL;  Surgeon: [name], MD;  Location: [facility] ENDOSCOPY;  Service: Gastroenterology;  Laterality: N/A;  . EYE SURGERY    . GIVENS CAPSULE STUDY N/A 02/26/2017   Procedure: GIVENS CAPSULE STUDY;  Surgeon: [name], MD;  Location: [facility] ENDOSCOPY;  Service: Gastroenterology;  Laterality: N/A;  . LEFT HEART CATH AND CORONARY ANGIOGRAPHY Left 09/07/2016   Procedure: Left Heart Cath and Coronary Angiography;  Surgeon: Isaias Cowman, MD;  Location: [place] CV LAB;  Service: Cardiovascular;  Laterality: Left;  . LIGATION OF ARTERIOVENOUS  FISTULA Left 02/07/2017   Procedure: LIGATION OF ARTERIOVENOUS  FISTULA ( BANDING BRACHIAL CEPHALIC );  Surgeon: Katha Cabal, MD;  Location: [facility] ORS;  Service: Vascular;  Laterality: Left;  . TUBAL LIGATION    . UPPER EXTREMITY ANGIOGRAPHY Left 01/19/2017   Procedure: Upper Extremity Angiography;  Surgeon: Katha Cabal, MD;  Location: [place] CV LAB;  Service: Cardiovascular;  Laterality: Left;  Social History Social History   Tobacco Use  . Smoking status: Former Smoker    Last attempt to quit: 10/09/2008    Years since quitting: 9.4  . Smokeless tobacco: Never Used  Substance Use Topics  . Alcohol use: No  . Drug use: No    Family History History reviewed. No pertinent family history. No family history of bleeding/clotting disorders, porphyria or autoimmune disease   Allergies  Allergen Reactions  . Penicillins Other (See Comments)    Childhood loss of consciousness - Pt is not allergic Has patient had a PCN reaction causing immediate rash, facial/tongue/throat swelling, SOB or lightheadedness with hypotension: Yes Has patient had a PCN reaction causing severe rash involving mucus membranes or skin necrosis: No Has patient  had a PCN reaction that required hospitalization Unknown Has patient had a PCN reaction occurring within the last 10 years: Unknown If all of the above answers are "NO", then may proceed with Cephalosporin use  . Valacyclovir Nausea Only     REVIEW OF SYSTEMS (Negative unless checked)  Constitutional: [] Weight loss  [] Fever  [] Chills Cardiac: [] Chest pain   [] Chest pressure   [] Palpitations   [] Shortness of breath when laying flat   [] Shortness of breath at rest   [] Shortness of breath with exertion. Vascular:  [] Pain in legs with walking   [] Pain in legs at rest   [] Pain in legs when laying flat   [] Claudication   [] Pain in feet when walking  [] Pain in feet at rest  [] Pain in feet when laying flat   [] History of DVT   [] Phlebitis   [] Swelling in legs   [] Varicose veins   [] Non-healing ulcers Pulmonary:   [] Uses home oxygen   [] Productive cough   [] Hemoptysis   [] Wheeze  [] COPD   [] Asthma Neurologic:  [] Dizziness  [] Blackouts   [] Seizures   [] History of stroke   [] History of TIA  [] Aphasia   [] Temporary blindness   [] Dysphagia   [] Weakness or numbness in arms   [] Weakness or numbness in legs Musculoskeletal:  [] Arthritis   [] Joint swelling   [] Joint pain   [] Low back pain Hematologic:  [] Easy bruising  [] Easy bleeding   [] Hypercoagulable state   [] Anemic  [] Hepatitis Gastrointestinal:  [] Blood in stool   [] Vomiting blood  [] Gastroesophageal reflux/heartburn   [] Difficulty swallowing. Genitourinary:  [x] Chronic kidney disease   [] Difficult urination  [] Frequent urination  [] Burning with urination   [] Blood in urine Skin:  [] Rashes   [] Ulcers   [] Wounds Psychological:  [] History of anxiety   []  History of major depression.  Physical Examination  Vitals:   04/05/18 0442 04/05/18 1143 04/05/18 1700 04/05/18 1701  BP: (!) 139/50 (!) 157/63 (!) 150/55 (!) 150/55  Pulse: 66 66 65 65  Resp: 18 14    Temp: 98.6 F (37 C) 98.2 F (36.8 C)    TempSrc: Oral Oral    SpO2: 96% 98%    Weight:       Height:       Body mass index is 32.72 kg/m. Gen:  WD/WN, NAD Head: /AT, No temporalis wasting. Prominent temp pulse not noted. Ear/Nose/Throat: Hearing grossly intact, nares w/o erythema or drainage, oropharynx w/o Erythema/Exudate Eyes: PERRLA, EOMI.  Neck: Supple, no nuchal rigidity.  No bruit or JVD.  Pulmonary:  Good air movement, clear to auscultation bilaterally.  Cardiac: RRR, normal S1, S2, no Murmurs, rubs or gallops. Vascular: PD catheter clean dry and intact; left arm brachiocephalic fistula with a good thrill moderate pulsatility Vessel Right  Left  Radial Palpable Palpable   Gastrointestinal: soft, non-tender/non-distended. No guarding/reflex. No masses, surgical incisions, or scars. Musculoskeletal: M/S 5/5 throughout.  Extremities without ischemic changes.  No deformity or atrophy. No edema. Neurologic: CN 2-12 intact. Pain and light touch intact in extremities.  Symmetrical.  Speech is fluent. Motor exam as listed above. Psychiatric: Judgment intact, Mood & affect appropriate for pt's clinical situation. Dermatologic: No rashes or ulcers noted.  No cellulitis or open wounds. Lymph : No Cervical, Axillary, or Inguinal lymphadenopathy.   CBC Lab Results  Component Value Date   WBC 8.0 04/04/2018   HGB 9.9 (L) 04/04/2018   HCT 28.9 (L) 04/04/2018   MCV 98.3 04/04/2018   PLT 183 04/04/2018    BMET    Component Value Date/Time   NA 132 (L) 04/05/2018 0354   K 4.7 04/05/2018 0354   K 4.8 01/06/2016 1416   CL 96 (L) 04/05/2018 0354   CO2 19 (L) 04/05/2018 0354   GLUCOSE 89 04/05/2018 0354   BUN 94 (H) 04/05/2018 0354   CREATININE 17.74 (H) 04/05/2018 0354   CALCIUM 7.0 (L) 04/05/2018 0354   GFRNONAA 2 (L) 04/05/2018 0354   GFRAA 2 (L) 04/05/2018 0354   Estimated Creatinine Clearance: 2.8 mL/min (A) (by C-G formula based on SCr of 17.74 mg/dL (H)).  COAG Lab Results  Component Value Date   INR 1.07 02/10/2017   INR 1.03 02/06/2017   INR 0.97  11/27/2016    Radiology Dg Abd 1 View  Result Date: 04/05/2018 CLINICAL DATA:  Peritoneal dialysis catheter not working properly. EXAM: ABDOMEN - 1 VIEW COMPARISON:  09/20/2016 FINDINGS: Peritoneal dialysis catheter enters the left lower abdomen and is looped in the right iliac fossa region. I do not see a kink. Bowel gas pattern is unremarkable. No significant bone finding. IMPRESSION: No evidence of catheter kink. Peritoneal dialysis catheter looped in the right iliac fossa region. Electronically Signed   By: Nelson Chimes M.D.   On: 04/05/2018 16:35     Assessment/Plan 1.  Complication dialysis device with thrombosis AV access:  Patient's PD dialysis access is not functioning.  Patient does have a left arm AV fistula.  They will attempt to use this while arrangements can be made for revision of the PD catheter. 2.  End-stage renal disease requiring hemodialysis:  Patient will continue dialysis therapy without further interruption if a successful thrombectomy is not achieved then catheter will be placed. Dialysis has already been arranged since the patient missed their previous session 3.  Hypertension:  Patient will continue medical management; nephrology is following no changes in oral medications. 4. Diabetes mellitus:  Glucose will be monitored and oral medications been held this morning once the patient has undergone the patient's procedure po intake will be reinitiated and again Accu-Cheks will be used to assess the blood glucose level and treat as needed. The patient will be restarted on the patient's usual hypoglycemic regime 5.  Coronary artery disease:  EKG will be monitored. Nitrates will be used if needed. The patient's oral cardiac medications will be continued.     Hortencia Pilar, MD  04/05/2018 5:59 PM

## 2018-04-05 NOTE — Progress Notes (Signed)
Infection Prevention  Received call from:  Hubert Azure, IP Specialist who was contacted by unit Unit:  2C Regarding:  MERS Flag displaying in chart IP Recommendation:  Reviewed patient admission history and travel screening information.  Patient does not meet any criteria for the alert.  Have requested nurse to contact IT to remove flag.  Patient does not require isolation based on this alert at this time.

## 2018-04-05 NOTE — ED Provider Notes (Signed)
[facility] Emergency Department Provider Note  ____________________________________________   I have reviewed the triage vital signs and the nursing notes. Where available I have reviewed prior notes and, if possible and indicated, outside hospital notes.    HISTORY  Chief Complaint Vascular Access Problem and Abdominal Pain    HPI Bethany Lee is a 67 y.o. female who has peritoneal dialysis, she has been having it for years.  She states that she has been gradually having more more difficulty extricating the filtrate.  She states 2 days ago for this reason she went to an outside provider who was to do a revamping of her port.  This was performed, that night she did not do dialysis, the night before she came in however she did.  She was not able to get all of the fluid off.  She believes is between 1400 - 2 L of ultrafiltrate still present.  She has a mild shortness of breath just from the distention of her abdomen.  It is uncomfortable to have it in there however she is had no fever no chills no nausea no vomiting diarrhea or other complaints.  She does feels that she has been "pregnant for 20 weeks".    Past Medical History:  Diagnosis Date  . Anemia   . CHF (congestive heart failure) ([place])   . Chronic kidney disease    peritoneal dialysis  . Complication of anesthesia   . Coronary artery disease   . Diabetes mellitus without complication ([place])   . Dialysis patient [facility])    Peritoneal dialysis patient  . Dyspnea    doe  . Edema   . History of recent blood transfusion 01/2017  . Hypercholesterolemia   . Hypertension   . Hypothyroidism   . Neuropathy   . PONV (postoperative nausea and vomiting)   . Shingles    October-November 2017  . Steal syndrome as complication of dialysis access ([place])   . Tremors of nervous system    intermittent when taking gabapentin    Patient Active Problem List   Diagnosis Date Noted  . Peritoneal dialysis  catheter dysfunction ([place]) 04/04/2018  . Complication from renal dialysis device 12/03/2017  . Elevated triglycerides with high cholesterol 02/21/2017  . Peripheral neuropathy 02/21/2017  . GIB (gastrointestinal bleeding) 02/10/2017  . Acute blood loss anemia   . Essential hypertension 02/02/2017  . GI bleeding 01/29/2017  . Subclavian arterial stenosis ([place]) 01/29/2017  . Acute respiratory distress   . Acute respiratory failure ([place]) 09/21/2016  . S/P drug eluting coronary stent placement 09/14/2016  . CAD (coronary artery disease) 09/07/2016  . Acute combined systolic and diastolic congestive heart failure ([place]) 08/17/2016  . Acute pulmonary edema ([place]) 07/16/2016  . Hypothyroidism (acquired) 06/08/2016  . ESRD on dialysis ([place]) 12/21/2015  . Peritoneal dialysis status ([place]) 12/21/2015  . Leg swelling 12/21/2015  . Anemia of chronic disease 12/21/2015  . Diabetes mellitus ([place]) 12/21/2015  . Hyperkalemia 12/21/2015  . 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 Anemia in chronic renal disease 10/04/2015  . DM type 2 with diabetic peripheral neuropathy ([place]) 09/13/2015  . Mixed hyperlipidemia 09/13/2015  . Type 2 diabetes mellitus with diabetic nephropathy, with long-term current use of insulin ([place]) 09/13/2015  . CKD (chronic kidney disease) stage 5, GFR less than 15 ml/min ([facility]) 07/09/2015  . Proteinuria 05/25/2015    Past Surgical History:  Procedure Laterality Date  . A/V FISTULAGRAM Left 01/16/2017   Procedure: A/V Fistulagram;  Surgeon: Katha Cabal,  MD;  Location: [place] [facility];  Service: Cardiovascular;  Laterality: Left;  . A/V SHUNT INTERVENTION N/A 01/16/2017   Procedure: A/V Shunt Intervention;  Surgeon: Katha Cabal, MD;  Location: [place] [facility];  Service: Cardiovascular;  Laterality: N/A;  . ABDOMINAL HYSTERECTOMY    . AV FISTULA PLACEMENT Left 11/30/2016   Procedure: ARTERIOVENOUS (AV) FISTULA CREATION ( EXPLORE FOR CREATION BRACHIOCEPHALIC);   Surgeon: Algernon Huxley, MD;  Location: [facility] ORS;  Service: Vascular;  Laterality: Left;  . CAPD INSERTION N/A 11/25/2015   Procedure: LAPAROSCOPIC INSERTION CONTINUOUS AMBULATORY PERITONEAL DIALYSIS  (CAPD) CATHETER;  Surgeon: Algernon Huxley, MD;  Location: [facility] ORS;  Service: Vascular;  Laterality: N/A;  . CAPD INSERTION N/A 01/06/2016   Procedure: LAPAROSCOPIC INSERTION CONTINUOUS AMBULATORY PERITONEAL DIALYSIS  (CAPD) CATHETER REVISION ;  Surgeon: Algernon Huxley, MD;  Location: [facility] ORS;  Service: Vascular;  Laterality: N/A;  . CAPD INSERTION N/A 10/11/2016   Procedure: LAPAROSCOPIC INSERTION CONTINUOUS AMBULATORY PERITONEAL DIALYSIS  (CAPD) CATHETER ( REVISION );  Surgeon: Algernon Huxley, MD;  Location: [facility] ORS;  Service: Vascular;  Laterality: N/A;  . CARDIAC CATHETERIZATION    . CATARACT EXTRACTION W/PHACO Right 05/25/2016   Procedure: CATARACT EXTRACTION PHACO AND INTRAOCULAR LENS PLACEMENT (IOC);  Surgeon: Eulogio Bear, MD;  Location: [facility] ORS;  Service: Ophthalmology;  Laterality: Right;  Lot # [id number] H [place]: 00:5.2 AP%: 11.3 CDE: 7.21  . CATARACT EXTRACTION W/PHACO Left 12/06/2017   Procedure: CATARACT EXTRACTION PHACO AND INTRAOCULAR LENS PLACEMENT (IOC);  Surgeon: Eulogio Bear, MD;  Location: [facility] ORS;  Service: Ophthalmology;  Laterality: Left;  [place] 00.23.4 AP% 7.9 CDE 1.83 Fluid Pack Lot # [id number]  . COLONOSCOPY WITH PROPOFOL N/A 02/15/2017   Procedure: COLONOSCOPY WITH PROPOFOL;  Surgeon: [name], MD;  Location[facility] ENDOSCOPY;  Service: Gastroenterology;  Laterality: N/A;  . CORONARY ANGIOPLASTY     STENT  . CORONARY STENT INTERVENTION N/A 09/07/2016   Procedure: Coronary Stent Intervention;  Surgeon: Isaias Cowman, MD;  Location: [place] [facility];  Service: Cardiovascular;  Laterality: N/A;  . DIALYSIS/PERMA CATHETER INSERTION N/A 09/21/2016   Procedure: Dialysis/Perma Catheter Insertion;  Surgeon: Algernon Huxley, MD;  Location: [place] [facility];  Service:  Cardiovascular;  Laterality: N/A;  . DIALYSIS/PERMA CATHETER REMOVAL Right 11/30/2016   Procedure: DIALYSIS/PERMA CATHETER REMOVAL;  Surgeon: Algernon Huxley, MD;  Location: [facility] ORS;  Service: Vascular;  Laterality: Right;  . ESOPHAGOGASTRODUODENOSCOPY (EGD) WITH PROPOFOL N/A 02/15/2017   Procedure: ESOPHAGOGASTRODUODENOSCOPY (EGD) WITH PROPOFOL;  Surgeon: [name], MD;  Location[facility] ENDOSCOPY;  Service: Gastroenterology;  Laterality: N/A;  . EYE SURGERY    . GIVENS CAPSULE STUDY N/A 02/26/2017   Procedure: GIVENS CAPSULE STUDY;  Surgeon: [name], MD;  Location: [facility] ENDOSCOPY;  Service: Gastroenterology;  Laterality: N/A;  . LEFT HEART CATH AND CORONARY ANGIOGRAPHY Left 09/07/2016   Procedure: Left Heart Cath and Coronary Angiography;  Surgeon: Isaias Cowman, MD;  Location: [place] [facility];  Service: Cardiovascular;  Laterality: Left;  . LIGATION OF ARTERIOVENOUS  FISTULA Left 02/07/2017   Procedure: LIGATION OF ARTERIOVENOUS  FISTULA ( BANDING BRACHIAL CEPHALIC );  Surgeon: Katha Cabal, MD;  Location: [facility] ORS;  Service: Vascular;  Laterality: Left;  . TUBAL LIGATION    . UPPER EXTREMITY ANGIOGRAPHY Left 01/19/2017   Procedure: Upper Extremity Angiography;  Surgeon: Katha Cabal, MD;  Location: [place] [facility];  Service: Cardiovascular;  Laterality: Left;    Prior to Admission medications  Medication Sig Start Date End Date Taking? Authorizing Provider  amLODipine (NORVASC) 5 MG tablet Take 5 mg by mouth daily.   Yes [provider]  aspirin EC 81 MG tablet Take 1 tablet (81 mg total) by mouth daily. Start taking after NOV 15 Patient taking differently: Take 81 mg by mouth daily.  05/26/15  Yes Murlean Iba, MD  calcium acetate (PHOSLO) 667 MG capsule Take 2,001 mg by mouth 3 (three) times daily with meals.   Yes [provider]  calcium carbonate (TUMS EX) 750 MG chewable tablet Chew 4 tablets by mouth 3 (three) times daily with meals.   Yes  [provider]  carvedilol (COREG) 12.5 MG tablet Take 1 tablet (12.5 mg total) by mouth 2 (two) times daily with a meal. Patient taking differently: Take 25 mg by mouth 2 (two) times daily with a meal.  07/20/16  Yes Fritzi Mandes, MD  hydrALAZINE (APRESOLINE) 50 MG tablet Take 50 mg by mouth 2 (two) times daily.   Yes [provider]  insulin aspart (NOVOLOG FLEXPEN) 100 UNIT/ML FlexPen Inject 4-8 Units into the skin 3 (three) times daily with meals. Sliding scale below 200 take 4 units, 200-250=6 units, >250= 8 units 09/13/15  Yes [provider]  Insulin Glargine (TOUJEO SOLOSTAR) 300 UNIT/ML SOPN Inject 20 Units into the skin at bedtime. Patient taking differently: Inject 24 Units into the skin at bedtime.  12/21/15  Yes Theodoro Grist, MD  iron polysaccharides (NIFEREX) 150 MG capsule Take 150 mg by mouth daily.   Yes [provider]  levothyroxine (SYNTHROID, LEVOTHROID) 112 MCG tablet Take 112 mcg by mouth daily before breakfast.    Yes [provider]  lovastatin (MEVACOR) 40 MG tablet Take 40 mg by mouth at bedtime.   Yes [provider]  Multiple Vitamins-Minerals (DIALYVITE 800/ULTRA D) TABS Take 1 tablet by mouth daily.   Yes [provider]  pantoprazole (PROTONIX) 40 MG tablet Take 1 tablet (40 mg total) by mouth 2 (two) times daily. 01/31/17  Yes Max Sane, MD  sevelamer carbonate (RENVELA) 800 MG tablet Take 1,600 mg by mouth 3 (three) times daily with meals.   Yes [provider]  urea (CARMOL) 20 % cream Apply 1 application topically daily as needed (for dry skin).   Yes [provider]  gentamicin cream (GARAMYCIN) 0.1 % Apply 1 application topically daily. Patient not taking: Reported on 12/03/2017 12/21/15   Theodoro Grist, MD  traMADol (ULTRAM) 50 MG tablet Take 1 tablet (50 mg total) by mouth every 8 (eight) hours as needed for moderate pain or severe pain. Patient not taking: Reported on 12/03/2017  02/07/17   Schnier, Dolores Lory, MD    Allergies Penicillins and Valacyclovir  History reviewed. No pertinent family history.  Social History Social History   Tobacco Use  . Smoking status: Former Smoker    Last attempt to quit: 10/09/2008    Years since quitting: 9.4  . Smokeless tobacco: Never Used  Substance Use Topics  . Alcohol use: No  . Drug use: No    Review of Systems Constitutional: No fever/chills Eyes: No visual changes. ENT: No sore throat. No stiff neck no neck pain Cardiovascular: Denies chest pain. Respiratory: See HPI regarding shortness of breath. Gastrointestinal:   no vomiting.  No diarrhea.  No constipation. Genitourinary: Negative for dysuria. Musculoskeletal: Negative lower extremity swelling Skin: Negative for rash. Neurological: Negative for severe headaches, focal weakness or numbness.   ____________________________________________   PHYSICAL EXAM:  VITAL SIGNS: ED Triage Vitals [04/04/18 1230]  Enc Vitals Group     BP (!) 141/61     Pulse Rate 66     Resp 18     Temp 98 F (36.7 C)     Temp Source Oral     SpO2 99 %     Weight 167 lb 8.8 oz (76 kg)     Height 5' (1.524 m)     Head Circumference      Peak Flow      Pain Score 5     Pain Loc      Pain Edu?      Excl. in [place]?     Constitutional: Alert and oriented. Well appearing and in no acute distress. Eyes: Conjunctivae are normal Head: Atraumatic HEENT: No congestion/rhinnorhea. Mucous membranes are moist.  Oropharynx non-erythematous Neck:   Nontender with no meningismus, no masses, no stridor Cardiovascular: Normal rate, regular rhythm. Grossly normal heart sounds.  Good peripheral circulation. Respiratory: Normal respiratory effort.  No retractions. Lungs CTAB. Abdominal: Soft and nontender.  Port is in place, there is significant abdominal distention consistent with retained diastylate Back:  There is no focal tenderness or step off.  there is no midline tenderness there  are no lesions noted. there is no CVA tenderness  Musculoskeletal: No lower extremity tenderness, no upper extremity tenderness. No joint effusions, no DVT signs strong distal pulses no edema Neurologic:  Normal speech and language. No gross focal neurologic deficits are appreciated.  Skin:  Skin is warm, dry and intact. No rash noted. Psychiatric: Mood and affect are normal. Speech and behavior are normal.  ____________________________________________   LABS (all labs ordered are listed, but only abnormal results are displayed)  Labs Reviewed  COMPREHENSIVE METABOLIC PANEL - Abnormal; Notable for the following components:      Result Value   Sodium 132 (*)    Chloride 95 (*)    CO2 19 (*)    Glucose, Bld 163 (*)    BUN 85 (*)    Creatinine, Ser 16.85 (*)    Calcium 7.0 (*)    Total Protein 5.9 (*)    Albumin 3.2 (*)    GFR calc non Af Amer 2 (*)    GFR calc Af Amer 2 (*)    Anion gap 18 (*)    All other components within normal limits  CBC WITH DIFFERENTIAL/PLATELET - Abnormal; Notable for the following components:   RBC 2.94 (*)    Hemoglobin 9.9 (*)    HCT 28.9 (*)    RDW 16.1 (*)    Neutro Abs 6.7 (*)    Lymphs Abs 0.9 (*)    All other components within normal limits  COMPREHENSIVE METABOLIC PANEL - Abnormal; Notable for the following components:   Sodium 132 (*)    Chloride 96 (*)    CO2 19 (*)    BUN 94 (*)    Creatinine, Ser 17.74 (*)    Calcium 7.0 (*)    Total Protein 5.3 (*)    Albumin 3.0 (*)    AST 12 (*)    GFR calc non Af Amer 2 (*)    GFR calc Af Amer 2 (*)    Anion gap 17 (*)    All other components within normal limits  GLUCOSE, CAPILLARY - Abnormal; Notable for the following components:   Glucose-Capillary 132 (*)    All other components within normal limits  GLUCOSE, CAPILLARY - Abnormal; Notable for the following  components:   Glucose-Capillary 107 (*)    All other components within normal limits  GLUCOSE, CAPILLARY  GLUCOSE, CAPILLARY   URINALYSIS, COMPLETE (UACMP) WITH MICROSCOPIC    Pertinent labs  results that were available during my care of the patient were reviewed by me and considered in my medical decision making (see chart for details). ____________________________________________  EKG  I personally interpreted any EKGs ordered by me or triage  ____________________________________________  RADIOLOGY  Pertinent labs & imaging results that were available during my care of the patient were reviewed by me and considered in my medical decision making (see chart for details). If possible, patient and/or family made aware of any abnormal findings.  No results found. ____________________________________________    PROCEDURES  Procedure(s) performed: None  Procedures  Critical Care performed: None  ____________________________________________   INITIAL IMPRESSION / ASSESSMENT AND PLAN / ED COURSE  Pertinent labs & imaging results that were available during my care of the patient were reviewed by me and considered in my medical decision making (see chart for details).  Patient in no acute distress, has however very uncomfortable sensation from having all that fluid in her abdomen.  No evidence that is infected white count etc. are normal.  I discussed with Dr. Holley Raring, of nephrology.  Very much appreciate the consult.  He advised admission for further evaluation.    ____________________________________________   FINAL CLINICAL IMPRESSION(S) / ED DIAGNOSES  Final diagnoses:  Peritoneal dialysis catheter dysfunction, initial encounter [facility])      This chart was dictated using voice recognition software.  Despite best efforts to proofread,  errors can occur which can change meaning.     Schuyler Amor, MD 04/05/18 1515

## 2018-04-05 NOTE — Progress Notes (Addendum)
[facility] [place] NAME: Bethany Lee    MR#:  [id number]  DATE OF BIRTH:  24-Sep-1950  SUBJECTIVE: Patient admitted for  Peritoneal dialysis catheter dysfunction.,  Patient had AV fistula in left arm and tried to use it last night for dialysis did not work.  Husband is very angry and saying that patient did not have dialysis for 6 days and something not to be done and we are not doing anything.  Call Dr. Holley Raring, call Dr. Lorenso Courier vascular surgeon on call to have temporary dialysis catheter for her dialysis.  Patient has distended abdomen, anasarca but no shortness of breath.  CHIEF COMPLAINT:   Chief Complaint  Patient presents with  . Vascular Access Problem  . Abdominal Pain   c/o of fullness in the abdomen and stretching of her abdomen. Last dialysis was 6 days ago, patient is having issues with peritoneal dialysis catheter and had a revision operation at [facility] on September 10th, continued to have peritoneal dialysis, peritoneal dialysis catheter flushed today, he was noted about 4 mm milligrams of Activase yesterday patient continues to have sluggish drainage.  Catheter problems, admitted for the same.  Patient husband insisted to use AV fistula last night for dialysis and refused for temporary dialysis catheter.  AV fistula did not work and now husband is angry that her dialysis was 6 days ago.  Patient told me that she did not want Dr. Lucky Cowboy or  Dr. Delana Meyer to touch her AV fistula or put in a temporary catheter.  Now has been requesting to talk to somebody at [facility].  I relayed information to Dr. Holley Raring who can de-escalate the situation and possibly have emergency temporary catheter to have her dialysis. REVIEW OF SYSTEMS:    Review of Systems  Constitutional: Negative for chills and fever.  HENT: Negative for hearing loss.   Eyes: Negative for blurred vision, double vision and photophobia.  Respiratory: Negative for cough, hemoptysis  and shortness of breath.   Cardiovascular: Negative for palpitations, orthopnea and leg swelling.  Gastrointestinal: Positive for abdominal pain. Negative for diarrhea and vomiting.  Genitourinary: Negative for dysuria and urgency.  Musculoskeletal: Negative for myalgias and neck pain.  Skin: Negative for rash.  Neurological: Negative for dizziness, focal weakness, seizures, weakness and headaches.  Psychiatric/Behavioral: Negative for memory loss. The patient does not have insomnia.     Nutrition:  Tolerating Diet: Tolerating PT:      DRUG ALLERGIES:   Allergies  Allergen Reactions  . Penicillins Other (See Comments)    Childhood loss of consciousness - Pt is not allergic Has patient had a PCN reaction causing immediate rash, facial/tongue/throat swelling, SOB or lightheadedness with hypotension: Yes Has patient had a PCN reaction causing severe rash involving mucus membranes or skin necrosis: No Has patient had a PCN reaction that required hospitalization Unknown Has patient had a PCN reaction occurring within the last 10 years: Unknown If all of the above answers are "NO", then may proceed with Cephalosporin use  . Valacyclovir Nausea Only    VITALS:  Blood pressure (!) 157/63, pulse 66, temperature 98.2 F (36.8 C), temperature source Oral, resp. rate 14, height 5' (1.524 m), weight 76 kg, SpO2 98 %.  PHYSICAL EXAMINATION:   Physical Exam  GENERAL:  67 y.o.-year-old patient lying in the bed with no acute distress.  EYES: Pupils equal, round, reactive to light and accommodation. No scleral icterus. Extraocular muscles intact.  HEENT: Head atraumatic, normocephalic.  Oropharynx and nasopharynx clear.  NECK:  Supple, no jugular venous distention. No thyroid enlargement, no tenderness.  LUNGS: Normal breath sounds bilaterally, no wheezing, rales,rhonchi or crepitation. No use of accessory muscles of respiration.  CARDIOVASCULAR: S1, S2 normal. No murmurs, rubs, or gallops.   ABDOMEN: Abdominal bloating, dressings are present in multiple areas of abdomen due to recent PD catheter revision procedure at [facility]..  EXTREMITIES: No pedal edema, cyanosis, or clubbing.  NEUROLOGIC: Cranial nerves II through XII are intact. Muscle strength 5/5 in all extremities. Sensation intact. Gait not checked.  PSYCHIATRIC: The patient is alert and oriented x 3.  SKIN: No obvious rash, lesion, or ulcer.    LABORATORY PANEL:   CBC Recent Labs  Lab 04/04/18 1341  WBC 8.0  HGB 9.9*  HCT 28.9*  PLT 183   ------------------------------------------------------------------------------------------------------------------  Chemistries  Recent Labs  Lab 04/05/18 0354  NA 132*  K 4.7  CL 96*  CO2 19*  GLUCOSE 89  BUN 94*  CREATININE 17.74*  CALCIUM 7.0*  AST 12*  ALT <5  ALKPHOS 63  BILITOT 0.6   ------------------------------------------------------------------------------------------------------------------  Cardiac Enzymes No results for input(s): TROPONINI in the last 168 hours. ------------------------------------------------------------------------------------------------------------------  RADIOLOGY:  No results found.   ASSESSMENT AND PLAN:   Active Problems:   Peritoneal dialysis catheter dysfunction ([place])  #1. peritoneal dialysis catheter dysfunction: Still not functional, spoke with Dr. Anthonette Legato, recommends getting temporary dialysis catheter, but husband insisted to use AV fistula as per registered nurse, last night for dialysis, AV fistula did  not work.  2.. History of hypertension: Stable 3.  Hypothyroidism: #4. diabetes mellitus type 2: Patient is on sliding scale insulin with coverage, [place].  Continue sliding scale insulin with coverage at this time.  50% time spent in counseling, coordination of care.    All the records are reviewed and case discussed with Care Management/Social Workerr. Management plans discussed with the  patient, family and they are in agreement.  CODE STATUS: full  TOTAL TIME TAKING CARE OF THIS PATIENT: 40 minutes.   POSSIBLE D/C IN 1-2 DAYS, DEPENDING ON CLINICAL CONDITION.   Epifanio Lesches M.D on 04/05/2018 at 1:13 PM  Between 7am to 6pm - Pager - [phone]  After 6pm go to [email or web address] - password [email or web address] [facility]  Office  [phone]  CC: Primary care physician; Tracie Harrier, MD

## 2018-04-06 ENCOUNTER — Observation Stay: Payer: Medicare HMO

## 2018-04-06 ENCOUNTER — Encounter (HOSPITAL_COMMUNITY): Payer: Self-pay | Admitting: Internal Medicine

## 2018-04-06 ENCOUNTER — Inpatient Hospital Stay (HOSPITAL_COMMUNITY)
Admission: AD | Admit: 2018-04-06 | Discharge: 2018-04-10 | DRG: 919 | Disposition: A | Discharge status: Home / Self Care | Payer: Medicare HMO | Source: Other Acute Inpatient Hospital | Attending: Internal Medicine | Admitting: Internal Medicine

## 2018-04-06 ENCOUNTER — Other Ambulatory Visit: Payer: Self-pay

## 2018-04-06 DIAGNOSIS — I5032 Chronic diastolic (congestive) heart failure: Secondary | ICD-10-CM | POA: Diagnosis present

## 2018-04-06 DIAGNOSIS — R188 Other ascites: Secondary | ICD-10-CM | POA: Diagnosis present

## 2018-04-06 DIAGNOSIS — E872 Acidosis, unspecified: Secondary | ICD-10-CM | POA: Diagnosis present

## 2018-04-06 DIAGNOSIS — E1122 Type 2 diabetes mellitus with diabetic chronic kidney disease: Secondary | ICD-10-CM | POA: Diagnosis present

## 2018-04-06 DIAGNOSIS — I10 Essential (primary) hypertension: Secondary | ICD-10-CM | POA: Diagnosis not present

## 2018-04-06 DIAGNOSIS — E8770 Fluid overload, unspecified: Secondary | ICD-10-CM | POA: Diagnosis not present

## 2018-04-06 DIAGNOSIS — Y812 Prosthetic and other implants, materials and accessory general- and plastic-surgery devices associated with adverse incidents: Secondary | ICD-10-CM | POA: Diagnosis present

## 2018-04-06 DIAGNOSIS — R140 Abdominal distension (gaseous): Secondary | ICD-10-CM | POA: Diagnosis present

## 2018-04-06 DIAGNOSIS — E440 Moderate protein-calorie malnutrition: Secondary | ICD-10-CM | POA: Diagnosis present

## 2018-04-06 DIAGNOSIS — Z7982 Long term (current) use of aspirin: Secondary | ICD-10-CM

## 2018-04-06 DIAGNOSIS — Z419 Encounter for procedure for purposes other than remedying health state, unspecified: Secondary | ICD-10-CM

## 2018-04-06 DIAGNOSIS — Z794 Long term (current) use of insulin: Secondary | ICD-10-CM | POA: Diagnosis not present

## 2018-04-06 DIAGNOSIS — I120 Hypertensive chronic kidney disease with stage 5 chronic kidney disease or end stage renal disease: Secondary | ICD-10-CM | POA: Diagnosis not present

## 2018-04-06 DIAGNOSIS — E039 Hypothyroidism, unspecified: Secondary | ICD-10-CM | POA: Diagnosis present

## 2018-04-06 DIAGNOSIS — Z87891 Personal history of nicotine dependence: Secondary | ICD-10-CM | POA: Diagnosis not present

## 2018-04-06 DIAGNOSIS — E1142 Type 2 diabetes mellitus with diabetic polyneuropathy: Secondary | ICD-10-CM | POA: Diagnosis present

## 2018-04-06 DIAGNOSIS — Z955 Presence of coronary angioplasty implant and graft: Secondary | ICD-10-CM | POA: Diagnosis not present

## 2018-04-06 DIAGNOSIS — K219 Gastro-esophageal reflux disease without esophagitis: Secondary | ICD-10-CM | POA: Diagnosis present

## 2018-04-06 DIAGNOSIS — I132 Hypertensive heart and chronic kidney disease with heart failure and with stage 5 chronic kidney disease, or end stage renal disease: Secondary | ICD-10-CM | POA: Diagnosis present

## 2018-04-06 DIAGNOSIS — E7800 Pure hypercholesterolemia, unspecified: Secondary | ICD-10-CM | POA: Diagnosis present

## 2018-04-06 DIAGNOSIS — I25119 Atherosclerotic heart disease of native coronary artery with unspecified angina pectoris: Secondary | ICD-10-CM | POA: Diagnosis not present

## 2018-04-06 DIAGNOSIS — N185 Chronic kidney disease, stage 5: Secondary | ICD-10-CM | POA: Diagnosis not present

## 2018-04-06 DIAGNOSIS — Z79899 Other long term (current) drug therapy: Secondary | ICD-10-CM

## 2018-04-06 DIAGNOSIS — E1140 Type 2 diabetes mellitus with diabetic neuropathy, unspecified: Secondary | ICD-10-CM | POA: Diagnosis present

## 2018-04-06 DIAGNOSIS — Z888 Allergy status to other drugs, medicaments and biological substances status: Secondary | ICD-10-CM | POA: Diagnosis not present

## 2018-04-06 DIAGNOSIS — R0603 Acute respiratory distress: Secondary | ICD-10-CM | POA: Diagnosis not present

## 2018-04-06 DIAGNOSIS — E871 Hypo-osmolality and hyponatremia: Secondary | ICD-10-CM | POA: Diagnosis present

## 2018-04-06 DIAGNOSIS — T85611D Breakdown (mechanical) of intraperitoneal dialysis catheter, subsequent encounter: Secondary | ICD-10-CM

## 2018-04-06 DIAGNOSIS — T82898A Other specified complication of vascular prosthetic devices, implants and grafts, initial encounter: Secondary | ICD-10-CM | POA: Diagnosis present

## 2018-04-06 DIAGNOSIS — Z538 Procedure and treatment not carried out for other reasons: Secondary | ICD-10-CM | POA: Diagnosis not present

## 2018-04-06 DIAGNOSIS — E119 Type 2 diabetes mellitus without complications: Secondary | ICD-10-CM | POA: Diagnosis not present

## 2018-04-06 DIAGNOSIS — D631 Anemia in chronic kidney disease: Secondary | ICD-10-CM | POA: Diagnosis present

## 2018-04-06 DIAGNOSIS — Z6830 Body mass index (BMI) 30.0-30.9, adult: Secondary | ICD-10-CM | POA: Diagnosis not present

## 2018-04-06 DIAGNOSIS — N189 Chronic kidney disease, unspecified: Secondary | ICD-10-CM

## 2018-04-06 DIAGNOSIS — T85611A Breakdown (mechanical) of intraperitoneal dialysis catheter, initial encounter: Principal | ICD-10-CM | POA: Diagnosis present

## 2018-04-06 DIAGNOSIS — Z992 Dependence on renal dialysis: Secondary | ICD-10-CM | POA: Diagnosis not present

## 2018-04-06 DIAGNOSIS — Z880 Allergy status to penicillin: Secondary | ICD-10-CM | POA: Diagnosis not present

## 2018-04-06 DIAGNOSIS — R0600 Dyspnea, unspecified: Secondary | ICD-10-CM | POA: Diagnosis present

## 2018-04-06 DIAGNOSIS — N2581 Secondary hyperparathyroidism of renal origin: Secondary | ICD-10-CM | POA: Diagnosis present

## 2018-04-06 DIAGNOSIS — I509 Heart failure, unspecified: Secondary | ICD-10-CM | POA: Diagnosis not present

## 2018-04-06 DIAGNOSIS — N186 End stage renal disease: Secondary | ICD-10-CM | POA: Diagnosis present

## 2018-04-06 DIAGNOSIS — I2510 Atherosclerotic heart disease of native coronary artery without angina pectoris: Secondary | ICD-10-CM | POA: Diagnosis present

## 2018-04-06 DIAGNOSIS — R109 Unspecified abdominal pain: Secondary | ICD-10-CM

## 2018-04-06 DIAGNOSIS — Z452 Encounter for adjustment and management of vascular access device: Secondary | ICD-10-CM | POA: Diagnosis not present

## 2018-04-06 DIAGNOSIS — M7989 Other specified soft tissue disorders: Secondary | ICD-10-CM | POA: Diagnosis not present

## 2018-04-06 DIAGNOSIS — E875 Hyperkalemia: Secondary | ICD-10-CM | POA: Diagnosis present

## 2018-04-06 LAB — URINALYSIS, COMPLETE (UACMP) WITH MICROSCOPIC
BACTERIA UA: NONE SEEN
Bilirubin Urine: NEGATIVE
Glucose, UA: 50 mg/dL — AB
Ketones, ur: NEGATIVE mg/dL
Nitrite: NEGATIVE
PROTEIN: 100 mg/dL — AB
RBC / HPF: >50 RBC/hpf — ABNORMAL HIGH (ref 0–5)
SPECIFIC GRAVITY, URINE: 1.01 (ref 1.005–1.030)
pH: 5 (ref 5.0–8.0)

## 2018-04-06 LAB — GLUCOSE, CAPILLARY
Glucose-Capillary: 100 mg/dL — ABNORMAL HIGH (ref 70–99)
Glucose-Capillary: 106 mg/dL — ABNORMAL HIGH (ref 70–99)
Glucose-Capillary: 125 mg/dL — ABNORMAL HIGH (ref 70–99)
Glucose-Capillary: 125 mg/dL — ABNORMAL HIGH (ref 70–99)
Glucose-Capillary: 126 mg/dL — ABNORMAL HIGH (ref 70–99)
Glucose-Capillary: 64 mg/dL — ABNORMAL LOW (ref 70–99)

## 2018-04-06 LAB — CREATININE, SERUM
CREATININE: 20.34 mg/dL — AB (ref 0.44–1.00)
GFR calc Af Amer: 2 mL/min — ABNORMAL LOW (ref >60)
GFR calc non Af Amer: 1 mL/min — ABNORMAL LOW (ref >60)

## 2018-04-06 MED ORDER — HYDRALAZINE HCL 50 MG PO TABS
50.0000 mg | ORAL_TABLET | Freq: Two times a day (BID) | ORAL | Status: DC
  Administered 2018-04-07 – 2018-04-10 (×6): 50 mg via ORAL
  Filled 2018-04-06 (×6): qty 1

## 2018-04-06 MED ORDER — AMLODIPINE BESYLATE 5 MG PO TABS
5.0000 mg | ORAL_TABLET | Freq: Every day | ORAL | Status: DC
  Administered 2018-04-08 – 2018-04-10 (×3): 5 mg via ORAL
  Filled 2018-04-06 (×3): qty 1

## 2018-04-06 MED ORDER — PRAVASTATIN SODIUM 40 MG PO TABS
40.0000 mg | ORAL_TABLET | Freq: Every day | ORAL | Status: DC
  Administered 2018-04-07 – 2018-04-09 (×3): 40 mg via ORAL
  Filled 2018-04-06 (×3): qty 1

## 2018-04-06 MED ORDER — LIDOCAINE HCL (PF) 1 % IJ SOLN
10.0000 mL | INTRAMUSCULAR | Status: AC
  Administered 2018-04-06: 10 mL
  Filled 2018-04-06: qty 10

## 2018-04-06 MED ORDER — SODIUM CHLORIDE 0.9% FLUSH
3.0000 mL | Freq: Two times a day (BID) | INTRAVENOUS | Status: DC
  Administered 2018-04-06 – 2018-04-10 (×7): 3 mL via INTRAVENOUS

## 2018-04-06 MED ORDER — LEVOTHYROXINE SODIUM 112 MCG PO TABS
112.0000 ug | ORAL_TABLET | Freq: Every day | ORAL | Status: DC
  Administered 2018-04-09 – 2018-04-10 (×2): 112 ug via ORAL
  Filled 2018-04-06 (×2): qty 1

## 2018-04-06 MED ORDER — INSULIN ASPART 100 UNIT/ML ~~LOC~~ SOLN: 0.0000 [IU] | Freq: Every day | SUBCUTANEOUS | Status: DC

## 2018-04-06 MED ORDER — INSULIN ASPART 100 UNIT/ML ~~LOC~~ SOLN: 0.0000 [IU] | Freq: Three times a day (TID) | SUBCUTANEOUS | Status: DC

## 2018-04-06 MED ORDER — INSULIN ASPART 100 UNIT/ML ~~LOC~~ SOLN
0.0000 [IU] | SUBCUTANEOUS | Status: DC
  Administered 2018-04-06 – 2018-04-08 (×3): 1 [IU] via SUBCUTANEOUS
  Administered 2018-04-09 (×2): 2 [IU] via SUBCUTANEOUS
  Administered 2018-04-09: 1 [IU] via SUBCUTANEOUS
  Administered 2018-04-09: 2 [IU] via SUBCUTANEOUS
  Administered 2018-04-10: 1 [IU] via SUBCUTANEOUS

## 2018-04-06 MED ORDER — ASPIRIN EC 81 MG PO TBEC
81.0000 mg | DELAYED_RELEASE_TABLET | Freq: Every day | ORAL | Status: DC
  Administered 2018-04-08 – 2018-04-10 (×3): 81 mg via ORAL
  Filled 2018-04-06 (×3): qty 1

## 2018-04-06 MED ORDER — HEPARIN SODIUM (PORCINE) 5000 UNIT/ML IJ SOLN
5000.0000 [IU] | Freq: Three times a day (TID) | INTRAMUSCULAR | Status: DC
  Administered 2018-04-06 – 2018-04-10 (×8): 5000 [IU] via SUBCUTANEOUS
  Filled 2018-04-06 (×8): qty 1

## 2018-04-06 MED ORDER — POLYSACCHARIDE IRON COMPLEX 150 MG PO CAPS
150.0000 mg | ORAL_CAPSULE | Freq: Every day | ORAL | Status: DC
  Administered 2018-04-08 – 2018-04-10 (×3): 150 mg via ORAL
  Filled 2018-04-06 (×3): qty 1

## 2018-04-06 MED ORDER — FUROSEMIDE 10 MG/ML IJ SOLN
100.0000 mg | Freq: Once | INTRAVENOUS | Status: AC
  Administered 2018-04-06: 100 mg via INTRAVENOUS
  Filled 2018-04-06: qty 10

## 2018-04-06 MED ORDER — RENA-VITE PO TABS
1.0000 | ORAL_TABLET | Freq: Every day | ORAL | Status: DC
  Administered 2018-04-07 – 2018-04-09 (×4): 1 via ORAL
  Filled 2018-04-06 (×4): qty 1

## 2018-04-06 MED ORDER — ACETAMINOPHEN 325 MG PO TABS
650.0000 mg | ORAL_TABLET | Freq: Four times a day (QID) | ORAL | Status: DC | PRN
  Administered 2018-04-06 – 2018-04-09 (×6): 650 mg via ORAL
  Filled 2018-04-06 (×6): qty 2

## 2018-04-06 MED ORDER — ACETAMINOPHEN 650 MG RE SUPP: 650.0000 mg | Freq: Four times a day (QID) | RECTAL | Status: DC | PRN

## 2018-04-06 MED ORDER — PANTOPRAZOLE SODIUM 40 MG PO TBEC
40.0000 mg | DELAYED_RELEASE_TABLET | Freq: Two times a day (BID) | ORAL | Status: DC
  Administered 2018-04-07 – 2018-04-10 (×7): 40 mg via ORAL
  Filled 2018-04-06 (×7): qty 1

## 2018-04-06 MED ORDER — SODIUM CHLORIDE 0.9 % IV SOLN
250.0000 mL | INTRAVENOUS | Status: DC | PRN
  Administered 2018-04-06: 250 mL via INTRAVENOUS
  Administered 2018-04-07: 08:00:00 via INTRAVENOUS

## 2018-04-06 MED ORDER — SEVELAMER CARBONATE 800 MG PO TABS
1600.0000 mg | ORAL_TABLET | Freq: Three times a day (TID) | ORAL | Status: DC
  Administered 2018-04-07 – 2018-04-10 (×5): 1600 mg via ORAL
  Filled 2018-04-06 (×5): qty 2

## 2018-04-06 MED ORDER — UREA 10 % EX LOTN: 1.0000 "application " | TOPICAL_LOTION | Freq: Every day | CUTANEOUS | Status: DC | PRN

## 2018-04-06 MED ORDER — CARVEDILOL 25 MG PO TABS
25.0000 mg | ORAL_TABLET | Freq: Two times a day (BID) | ORAL | Status: DC
  Administered 2018-04-07 – 2018-04-10 (×4): 25 mg via ORAL
  Filled 2018-04-06 (×4): qty 1

## 2018-04-06 MED ORDER — SODIUM CHLORIDE 0.9% FLUSH: 3.0000 mL | INTRAVENOUS | Status: DC | PRN

## 2018-04-06 MED ORDER — CALCIUM ACETATE (PHOS BINDER) 667 MG PO CAPS
2001.0000 mg | ORAL_CAPSULE | Freq: Three times a day (TID) | ORAL | Status: DC
  Administered 2018-04-07 – 2018-04-10 (×5): 2001 mg via ORAL
  Filled 2018-04-06 (×5): qty 3

## 2018-04-06 NOTE — H&P (Signed)
TRH H&P   Patient Demographics:    Bethany Lee, is a 67 y.o. female  MRN: [id number]   DOB - [birth year]  Admit Date - 04/06/2018  Outpatient Primary MD for the patient is Tracie Harrier, MD  Referring MD/NP/PA: Epifanio Lesches  Outpatient Specialists:    Patient coming from: [facility]  No chief complaint on file.  Peritoneal dialysis catheter dysfunction   HPI:    Bethany Lee  is a 67 y.o. female, w hypertension, hyperlipidemia, Dm2, CHF (chronic diastolic), ESRD on peritoneal dialysis w hx of steal syndrome as complication of dialysis access apparently presents from [facility].  Her PD catheter has not been working and she was admitted to [facility].  Her last dialysis was Monday.  They attempted TPA in her PD catheter which was not successful.  Attempted access of L AVF not successful.  Vascular surgery consulted at [facility] and not able to obtain access and so sent to [facility] for evaluation.   Pt notes swelling of the left arm, otherwise breathing ok.   I have contacted vascular as well as nephrology to come to evaluate the patient and arrange for access as well as dialysis.  Appreciate their input.     Review of systems:    In addition to the HPI above, No Fever-chills, No Headache, No changes with Vision or hearing, No problems swallowing food or Liquids, No Chest pain, No Cough or Shortness of Breath, No Abdominal pain, No Nausea or Vommitting, Bowel movements are regular, No Blood in stool or Urine, No dysuria, No new skin rashes or bruises, No new joints pains-aches,  No new weakness, tingling, numbness in any extremity, No recent weight gain or loss, No polyuria, polydypsia or polyphagia, No significant Mental Stressors.  A full 10 point Review of Systems was done, except as stated  above, all other Review of Systems were negative.   With Past History of the following :    Past Medical History:  Diagnosis Date  . Anemia   . CHF (congestive heart failure) ([place])   . Chronic kidney disease    peritoneal dialysis  . Complication of anesthesia   . Coronary artery disease   . Diabetes mellitus without complication ([place])   . Dialysis patient [facility])    Peritoneal dialysis patient  . Dyspnea    doe  . Edema   . History of recent blood transfusion 01/2017  . Hypercholesterolemia   . Hypertension   . Hypothyroidism   . Neuropathy   . PONV (postoperative nausea and vomiting)   . Shingles    October-November 2017  . Steal syndrome as complication of dialysis access ([place])   . Tremors of nervous system    intermittent when taking gabapentin      Past Surgical History:  Procedure Laterality Date  . A/V FISTULAGRAM Left 01/16/2017  Procedure: A/V Fistulagram;  Surgeon: Katha Cabal, MD;  Location: [place] CV LAB;  Service: Cardiovascular;  Laterality: Left;  . A/V SHUNT INTERVENTION N/A 01/16/2017   Procedure: A/V Shunt Intervention;  Surgeon: Katha Cabal, MD;  Location: [place] CV LAB;  Service: Cardiovascular;  Laterality: N/A;  . ABDOMINAL HYSTERECTOMY    . AV FISTULA PLACEMENT Left 11/30/2016   Procedure: ARTERIOVENOUS (AV) FISTULA CREATION ( EXPLORE FOR CREATION BRACHIOCEPHALIC);  Surgeon: Algernon Huxley, MD;  Location: [facility] ORS;  Service: Vascular;  Laterality: Left;  . CAPD INSERTION N/A 11/25/2015   Procedure: LAPAROSCOPIC INSERTION CONTINUOUS AMBULATORY PERITONEAL DIALYSIS  (CAPD) CATHETER;  Surgeon: Algernon Huxley, MD;  Location: [facility] ORS;  Service: Vascular;  Laterality: N/A;  . CAPD INSERTION N/A 01/06/2016   Procedure: LAPAROSCOPIC INSERTION CONTINUOUS AMBULATORY PERITONEAL DIALYSIS  (CAPD) CATHETER REVISION ;  Surgeon: Algernon Huxley, MD;  Location: [facility] ORS;  Service: Vascular;  Laterality: N/A;  . CAPD INSERTION N/A 10/11/2016   Procedure:  LAPAROSCOPIC INSERTION CONTINUOUS AMBULATORY PERITONEAL DIALYSIS  (CAPD) CATHETER ( REVISION );  Surgeon: Algernon Huxley, MD;  Location: [facility] ORS;  Service: Vascular;  Laterality: N/A;  . CARDIAC CATHETERIZATION    . CATARACT EXTRACTION W/PHACO Right 05/25/2016   Procedure: CATARACT EXTRACTION PHACO AND INTRAOCULAR LENS PLACEMENT (IOC);  Surgeon: Eulogio Bear, MD;  Location: [facility] ORS;  Service: Ophthalmology;  Laterality: Right;  Lot # [id number] H [place]: 00:5.2 AP%: 11.3 CDE: 7.21  . CATARACT EXTRACTION W/PHACO Left 12/06/2017   Procedure: CATARACT EXTRACTION PHACO AND INTRAOCULAR LENS PLACEMENT (IOC);  Surgeon: Eulogio Bear, MD;  Location: [facility] ORS;  Service: Ophthalmology;  Laterality: Left;  [place] 00.23.4 AP% 7.9 CDE 1.83 Fluid Pack Lot # [id number]  . COLONOSCOPY WITH PROPOFOL N/A 02/15/2017   Procedure: COLONOSCOPY WITH PROPOFOL;  Surgeon: [name], MD;  Location[facility]COPY;  Service: Gastroenterology;  Laterality: N/A;  . CORONARY ANGIOPLASTY     STENT  . CORONARY STENT INTERVENTION N/A 09/07/2016   Procedure: Coronary Stent Intervention;  Surgeon: Isaias Cowman, MD;  Location: [place] CV LAB;  Service: Cardiovascular;  Laterality: N/A;  . DIALYSIS/PERMA CATHETER INSERTION N/A 09/21/2016   Procedure: Dialysis/Perma Catheter Insertion;  Surgeon: Algernon Huxley, MD;  Location: [place] CV LAB;  Service: Cardiovascular;  Laterality: N/A;  . DIALYSIS/PERMA CATHETER REMOVAL Right 11/30/2016   Procedure: DIALYSIS/PERMA CATHETER REMOVAL;  Surgeon: Algernon Huxley, MD;  Location: [facility] ORS;  Service: Vascular;  Laterality: Right;  . ESOPHAGOGASTRODUODENOSCOPY (EGD) WITH PROPOFOL N/A 02/15/2017   Procedure: ESOPHAGOGASTRODUODENOSCOPY (EGD) WITH PROPOFOL;  Surgeon: [name], MD;  Location: [facility] ENDOSCOPY;  Service: Gastroenterology;  Laterality: N/A;  . EYE SURGERY    . GIVENS CAPSULE STUDY N/A 02/26/2017   Procedure: GIVENS CAPSULE STUDY;  Surgeon: [name], MD;  Location: [facility]  ENDOSCOPY;  Service: Gastroenterology;  Laterality: N/A;  . LEFT HEART CATH AND CORONARY ANGIOGRAPHY Left 09/07/2016   Procedure: Left Heart Cath and Coronary Angiography;  Surgeon: Isaias Cowman, MD;  Location: [place] CV LAB;  Service: Cardiovascular;  Laterality: Left;  . LIGATION OF ARTERIOVENOUS  FISTULA Left 02/07/2017   Procedure: LIGATION OF ARTERIOVENOUS  FISTULA ( BANDING BRACHIAL CEPHALIC );  Surgeon: Katha Cabal, MD;  Location: [facility] ORS;  Service: Vascular;  Laterality: Left;  . TUBAL LIGATION    . UPPER EXTREMITY ANGIOGRAPHY Left 01/19/2017   Procedure: Upper Extremity Angiography;  Surgeon: Katha Cabal, MD;  Location: [place] CV LAB;  Service: Cardiovascular;  Laterality: Left;  Social History:     Social History   Tobacco Use  . Smoking status: Former Smoker    Last attempt to quit: 10/09/2008    Years since quitting: 9.4  . Smokeless tobacco: Never Used  Substance Use Topics  . Alcohol use: No     Lives - at home  Mobility - walks by self   Family History :     Family History  Problem Relation Age of Onset  . Diabetes Mother       Home Medications:   Prior to Admission medications   Medication Sig Start Date End Date Taking? Authorizing Provider  amLODipine (NORVASC) 5 MG tablet Take 5 mg by mouth daily.    [provider]  aspirin EC 81 MG tablet Take 1 tablet (81 mg total) by mouth daily. Start taking after NOV 15 Patient taking differently: Take 81 mg by mouth daily.  05/26/15   Murlean Iba, MD  calcium acetate (PHOSLO) 667 MG capsule Take 2,001 mg by mouth 3 (three) times daily with meals.    [provider]  carvedilol (COREG) 12.5 MG tablet Take 1 tablet (12.5 mg total) by mouth 2 (two) times daily with a meal. Patient taking differently: Take 25 mg by mouth 2 (two) times daily with a meal.  07/20/16   Fritzi Mandes, MD  gentamicin cream (GARAMYCIN) 0.1 % Apply 1 application topically  daily. Patient not taking: Reported on 12/03/2017 12/21/15   Theodoro Grist, MD  hydrALAZINE (APRESOLINE) 50 MG tablet Take 50 mg by mouth 2 (two) times daily.    [provider]  insulin aspart (NOVOLOG FLEXPEN) 100 UNIT/ML FlexPen Inject 4-8 Units into the skin 3 (three) times daily with meals. Sliding scale below 200 take 4 units, 200-250=6 units, >250= 8 units 09/13/15   [provider]  Insulin Glargine (TOUJEO SOLOSTAR) 300 UNIT/ML SOPN Inject 20 Units into the skin at bedtime. Patient taking differently: Inject 24 Units into the skin at bedtime.  12/21/15   Theodoro Grist, MD  iron polysaccharides (NIFEREX) 150 MG capsule Take 150 mg by mouth daily.    [provider]  levothyroxine (SYNTHROID, LEVOTHROID) 112 MCG tablet Take 112 mcg by mouth daily before breakfast.     [provider]  lovastatin (MEVACOR) 40 MG tablet Take 40 mg by mouth at bedtime.    [provider]  Multiple Vitamins-Minerals (DIALYVITE 800/ULTRA D) TABS Take 1 tablet by mouth daily.    [provider]  pantoprazole (PROTONIX) 40 MG tablet Take 1 tablet (40 mg total) by mouth 2 (two) times daily. 01/31/17   Max Sane, MD  sevelamer carbonate (RENVELA) 800 MG tablet Take 1,600 mg by mouth 3 (three) times daily with meals.    [provider]  traMADol (ULTRAM) 50 MG tablet Take 1 tablet (50 mg total) by mouth every 8 (eight) hours as needed for moderate pain or severe pain. Patient not taking: Reported on 12/03/2017 02/07/17   Schnier, Dolores Lory, MD  urea (CARMOL) 20 % cream Apply 1 application topically daily as needed (for dry skin).    [provider]     Allergies:     Allergies  Allergen Reactions  . Penicillins Other (See Comments)    Childhood loss of consciousness - Pt is not allergic Has patient had a PCN reaction causing immediate rash, facial/tongue/throat swelling, SOB or lightheadedness with hypotension: Yes Has patient had a PCN  reaction causing severe rash involving mucus membranes or skin necrosis: No Has  patient had a PCN reaction that required hospitalization Unknown Has patient had a PCN reaction occurring within the last 10 years: Unknown If all of the above answers are "NO", then may proceed with Cephalosporin use  . Valacyclovir Nausea Only     Physical Exam:   Vitals  Height 5' (1.524 m).   1. General  lying in bed in NAD,  2. Normal affect and insight, Not Suicidal or Homicidal, Awake Alert, Oriented X 3.  3. No F.N deficits, ALL C.Nerves Intact, Strength 5/5 all 4 extremities, Sensation intact all 4 extremities, Plantars down going.  4. Ears and Eyes appear Normal, Conjunctivae clear, PERRLA. Moist Oral Mucosa.  5. Supple Neck, No JVD, No cervical lymphadenopathy appriciated, No Carotid Bruits.  6. Symmetrical Chest wall movement, Good air movement bilaterally, CTAB.  7. RRR, s1, s2, no m/g/r  8. Positive Bowel Sounds, Abdomen Soft, No tenderness, No organomegaly appriciated,No rebound -guarding or rigidity.  9.  No Cyanosis, Normal Skin Turgor, No Skin Rash or Bruise.  10. Good muscle tone,  joints appear normal , no effusions, Normal ROM.  11. No Palpable Lymph Nodes in Neck or Axillae  L AVF, + thrill   Data Review:    CBC Recent Labs  Lab 04/04/18 1341  WBC 8.0  HGB 9.9*  HCT 28.9*  PLT 183  MCV 98.3  MCH 33.6  MCHC 34.2  RDW 16.1*  LYMPHSABS 0.9*  MONOABS 0.4  EOSABS 0.1  BASOSABS 0.0   ------------------------------------------------------------------------------------------------------------------  Chemistries  Recent Labs  Lab 04/04/18 1341 04/05/18 0354 04/06/18 1949  NA 132* 132*  --   K 4.5 4.7  --   CL 95* 96*  --   CO2 19* 19*  --   GLUCOSE 163* 89  --   BUN 85* 94*  --   CREATININE 16.85* 17.74* 20.34*  CALCIUM 7.0* 7.0*  --   AST 16 12*  --   ALT <5 <5  --   ALKPHOS 64 63  --   BILITOT 0.6 0.6  --     ------------------------------------------------------------------------------------------------------------------ estimated creatinine clearance is 2.4 mL/min (A) (by C-G formula based on SCr of 20.34 mg/dL (H)). ------------------------------------------------------------------------------------------------------------------ No results for input(s): TSH, T4TOTAL, T3FREE, THYROIDAB in the last 72 hours.  Invalid input(s): FREET3  Coagulation profile No results for input(s): INR, PROTIME in the last 168 hours. ------------------------------------------------------------------------------------------------------------------- No results for input(s): DDIMER in the last 72 hours. -------------------------------------------------------------------------------------------------------------------  Cardiac Enzymes No results for input(s): CKMB, TROPONINI, MYOGLOBIN in the last 168 hours.  Invalid input(s): CK ------------------------------------------------------------------------------------------------------------------    Component Value Date/Time   BNP 1,794.0 (H) 09/21/2016 2244     ---------------------------------------------------------------------------------------------------------------  Urinalysis    Component Value Date/Time   COLORURINE YELLOW (A) 04/06/2018 0700   APPEARANCEUR TURBID (A) 04/06/2018 0700   LABSPEC 1.010 04/06/2018 0700   PHURINE 5.0 04/06/2018 0700   GLUCOSEU 50 (A) 04/06/2018 0700   HGBUR MODERATE (A) 04/06/2018 0700   BILIRUBINUR NEGATIVE 04/06/2018 0700   KETONESUR NEGATIVE 04/06/2018 0700   PROTEINUR 100 (A) 04/06/2018 0700   NITRITE NEGATIVE 04/06/2018 0700   LEUKOCYTESUR LARGE (A) 04/06/2018 0700    ----------------------------------------------------------------------------------------------------------------   Imaging Results:    Dg Abd 1 View  Result Date: 04/06/2018 CLINICAL DATA:  Peritoneal dialysis catheter dysfunction.  Abdominal distension. EXAM: ABDOMEN - 1 VIEW COMPARISON:  Supine view of the abdomen dated 04/05/2018. Plain film of the abdomen dated 09/20/2016. FINDINGS: The dialysis catheter is below the lower limits of today's exam. Visualized bowel gas pattern  is nonobstructive. No evidence of free intraperitoneal air seen. No radiopaque calculi. No acute or suspicious osseous finding. IMPRESSION: 1. No acute findings.  Nonobstructive bowel gas pattern. 2. Peritoneal dialysis catheter appreciated on yesterday's exam is located below the lower limits of today's exam. Electronically Signed   By: Franki Cabot M.D.   On: 04/06/2018 11:22   Dg Abd 1 View  Result Date: 04/05/2018 CLINICAL DATA:  Peritoneal dialysis catheter not working properly. EXAM: ABDOMEN - 1 VIEW COMPARISON:  09/20/2016 FINDINGS: Peritoneal dialysis catheter enters the left lower abdomen and is looped in the right iliac fossa region. I do not see a kink. Bowel gas pattern is unremarkable. No significant bone finding. IMPRESSION: No evidence of catheter kink. Peritoneal dialysis catheter looped in the right iliac fossa region. Electronically Signed   By: Nelson Chimes M.D.   On: 04/05/2018 16:35       Assessment & Plan:    Active Problems:   CAD (coronary artery disease)   DM type 2 with diabetic peripheral neuropathy (HCC)   Hypothyroidism (acquired)   Type 2 diabetes mellitus with diabetic nephropathy, with long-term current use of insulin (HCC)   Peritoneal dialysis catheter dysfunction ([place])      ESRD on Peritoneal dialysis PD dialysis catheter dysfunction NPO after MN Vascular to evaluate the patient and arrange for tunnelled catheter placement in chest in AM Nephrology consulted and will arrange for dialysis  ESRD Cont Phoslo Cont Renvela  Anemia Cont Niferex  Hypertension, CAD Cont Carvedilol 12.5mg  po bid Cont Hydralazine 50mg  po tid Cont Norvasc 5mg  po qday Cont Aspirin 81mg  po qday  Hyperlipidemia Cont  Lovastatin=> Pravastatin  Dm2 HOLD Lantus since NPO after MN fsbs q4h, ISS  Hypothyroidism Cont Levothyroxine 112 micrograms po qday  Gerd Cont PPI   DVT Prophylaxis Heparin  - SCDs  AM Labs Ordered, also please review Full Orders  Family Communication: Admission, patients condition and plan of care including tests being ordered have been discussed with the patient  who indicate understanding and agree with the plan and Code Status.  Code Status  FULL CODE  Likely DC to  home  Condition GUARDED    Consults called: nephrology, spoke with Carolin Sicks, and vascular, spoke with Dr Donnetta Hutching, appreciate input  Admission status: observation,  Pt will require hospitalization for monitoring of ESRD while awaiting dialysis catheter and dialysis. Pt will likely need <2 nites stay, however depending upon vascular and dialysis need might require inpatient stay  Time spent in minutes : 70   Jani Gravel M.D on 04/06/2018 at 9:45 PM  Between 7am to 7pm - Pager - [phone]  . After 7pm go to [email or web address] - password [facility]  [facility] - Office  [phone]

## 2018-04-06 NOTE — Progress Notes (Signed)
Pt was d/c to [facility] and handoff given to [facility]. Transferred due to pt's request. Vitals were stable and she was AO*4. Pt's husband given information of pt room and nurse and she is released from Dr. Governor Specking care to Dr. Bonner Puna.

## 2018-04-06 NOTE — Progress Notes (Signed)
[facility]  ROUNDING NOTE   Subjective:  Patient seen at bedside. Patient and her husband rather upset today. We summarized with the patient and her husband what has been done thus far to perform dialysis. Temporary dialysis catheter placement was attempted but unsuccessful. Patient and her family have requested transfer to [facility].   Objective:  Vital signs in last 24 hours:  Temp:  [97.9 F (36.6 C)-98.5 F (36.9 C)] 98.1 F (36.7 C) (09/14 1356) Pulse Rate:  [65-72] 72 (09/14 1356) Resp:  [16-24] 16 (09/14 1356) BP: (133-150)/(55-79) 150/55 (09/14 1356) SpO2:  [94 %-100 %] 94 % (09/14 1356)  Weight change:  Filed Weights   04/04/18 1230  Weight: 76 kg    Intake/Output: I/O last 3 completed shifts: In: 3 [I.V.:3] Out: -    Intake/Output this shift:  Total I/O In: 240 [P.O.:240] Out: 60 [Urine:60]  Physical Exam: General: No acute distress  Head: Normocephalic, atraumatic. Moist oral mucosal membranes  Eyes: Anicteric  Neck: Supple, trachea midline  Lungs:  Clear to auscultation, normal effort  Heart: S1S2 no rubs  Abdomen:  Soft, distended, nontender  Extremities: 1+ peripheral edema.  Neurologic: Awake, alert, following commands  Skin: No lesions  Access: Peritoneal dialysis catheter in place    Basic Metabolic Panel: Recent Labs  Lab 04/04/18 1341 04/05/18 0354 04/05/18 2037  NA 132* 132*  --   K 4.5 4.7  --   CL 95* 96*  --   CO2 19* 19*  --   GLUCOSE 163* 89  --   BUN 85* 94*  --   CREATININE 16.85* 17.74*  --   CALCIUM 7.0* 7.0*  --   PHOS  --   --  9.0*    Liver Function Tests: Recent Labs  Lab 04/04/18 1341 04/05/18 0354  AST 16 12*  ALT <5 <5  ALKPHOS 64 63  BILITOT 0.6 0.6  PROT 5.9* 5.3*  ALBUMIN 3.2* 3.0*   No results for input(s): LIPASE, AMYLASE in the last 168 hours. No results for input(s): AMMONIA in the last 168 hours.  CBC: Recent Labs  Lab 04/04/18 1341  WBC 8.0  NEUTROABS 6.7*   HGB 9.9*  HCT 28.9*  MCV 98.3  PLT 183    Cardiac Enzymes: No results for input(s): CKTOTAL, CKMB, CKMBINDEX, TROPONINI in the last 168 hours.  BNP: Invalid input(s): POCBNP  CBG: Recent Labs  Lab 04/05/18 2139 04/06/18 0033 04/06/18 0738 04/06/18 0803 04/06/18 1203  GLUCAP 84 126* 84* 100* 125*    Microbiology: Results for orders placed or performed during the hospital encounter of 04/04/18  MRSA PCR Screening     Status: None   Collection Time: 04/05/18  8:37 PM  Result Value Ref Range Status   MRSA by PCR NEGATIVE NEGATIVE Final    Comment:        The GeneXpert MRSA Assay (FDA approved for NASAL specimens only), is one component of a comprehensive MRSA colonization surveillance program. It is not intended to diagnose MRSA infection nor to guide or monitor treatment for MRSA infections. Performed at [facility], [address] [place],  [address]     Coagulation Studies: No results for input(s): LABPROT, INR in the last 72 hours.  Urinalysis: Recent Labs    04/06/18 0700  COLORURINE YELLOW*  LABSPEC 1.010  PHURINE 5.0  GLUCOSEU 50*  HGBUR MODERATE*  BILIRUBINUR NEGATIVE  KETONESUR NEGATIVE  PROTEINUR 100*  NITRITE NEGATIVE  LEUKOCYTESUR LARGE*  Imaging: Dg Abd 1 View  Result Date: 04/06/2018 CLINICAL DATA:  Peritoneal dialysis catheter dysfunction. Abdominal distension. EXAM: ABDOMEN - 1 VIEW COMPARISON:  Supine view of the abdomen dated 04/05/2018. Plain film of the abdomen dated 09/20/2016. FINDINGS: The dialysis catheter is below the lower limits of today's exam. Visualized bowel gas pattern is nonobstructive. No evidence of free intraperitoneal air seen. No radiopaque calculi. No acute or suspicious osseous finding. IMPRESSION: 1. No acute findings.  Nonobstructive bowel gas pattern. 2. Peritoneal dialysis catheter appreciated on yesterday's exam is located below the lower limits of today's exam. Electronically Signed    By: Franki Cabot M.D.   On: 04/06/2018 11:22   Dg Abd 1 View  Result Date: 04/05/2018 CLINICAL DATA:  Peritoneal dialysis catheter not working properly. EXAM: ABDOMEN - 1 VIEW COMPARISON:  09/20/2016 FINDINGS: Peritoneal dialysis catheter enters the left lower abdomen and is looped in the right iliac fossa region. I do not see a kink. Bowel gas pattern is unremarkable. No significant bone finding. IMPRESSION: No evidence of catheter kink. Peritoneal dialysis catheter looped in the right iliac fossa region. Electronically Signed   By: Nelson Chimes M.D.   On: 04/05/2018 16:35     Medications:   . sodium chloride    . sodium chloride    . sodium chloride     . amLODipine  5 mg Oral Daily  . aspirin EC  81 mg Oral Daily  . calcium acetate  2,001 mg Oral TID WC  . calcium carbonate  3,000 mg Oral TID WC  . carvedilol  25 mg Oral BID WC  . Chlorhexidine Gluconate Cloth  6 each Topical Q0600  . heparin  5,000 Units Subcutaneous Q8H  . hydrALAZINE  50 mg Oral BID  . insulin aspart  0-9 Units Subcutaneous TID WC  . insulin glargine  24 Units Subcutaneous QHS  . iron polysaccharides  150 mg Oral Daily  . levothyroxine  112 mcg Oral QAC breakfast  . multivitamin  1 tablet Oral Daily  . pantoprazole  40 mg Oral BID  . polyethylene glycol  17 g Oral BID  . pravastatin  20 mg Oral q1800  . sevelamer carbonate  1,600 mg Oral TID WC  . sodium chloride flush  3 mL Intravenous Q12H     Assessment/ Plan:  67 y.o. female [place] female with end-stage renal disease on peritoneal dialysis, congestive heart failure, coronary disease, diabetes, hypertension, hyperlipidemia, hypothyroidism, history of GI bleed  Dialysis: [facility] 5 x 2.25Litres ; tidal 85%  1.  ESRD on PD/PD catheter dysfunction.  Patient with PD catheter dysfunction.  Peritoneal dialysis catheter was just revised on April 02, 2018.  PD fluid instilling easily but significant difficulties noted with drainage  despite patient in an upright position.  4mg  of Activase was instilled within the catheter for greater than 8 hours but despite this PD catheter drainage is still poor.  The catheter tip appears to be in the right iliac fossa.  Thereafter we attempted hemodialysis using her left upper extremity AV fistula which was thought to be quite pulsatile.  Immediately after cannulation there was extravasation of blood.  Therefore we then recommended that she undergo temporary dialysis catheter placement in the femoral vein (pt request).  This was attempted by vascular surgery earlier this a.m. however resistance was encountered with the guidewire.  Vascular surgery then recommended temporary IJ dialysis catheter placement however patient and her husband declined and have requested transfer to [facility].  Therefore arrangements have been made for this.  We would recommend placement of IJ dialysis catheter followed by dialysis later today.  Dr. Raul Del of [facility] can then be consulted as an outpt to further revise catheter as needed.   2. Anemia of CKD: She receives Epogen as an outpatient.  3. Secondary hyperparathyroidism with hypocalcemia.  Recommend periodic monitoring of bone mineral metabolism parameters at [facility].  4.  Hypertension: Blood pressure currently 150/55.  Continue amlodipine, carvedilol, and hydralazine.   LOS: 0 Klara Stjames 9/14/20193:03 PM

## 2018-04-06 NOTE — Progress Notes (Signed)
[facility] [place] NAME: Bethany Lee    MR#:  [id number]  DATE OF BIRTH:  04/25/1951  SUBJECTIVE: Patient admitted for  Peritoneal dialysis catheter dysfunction.,  Patient had AV fistula in left arm and tried to use it last night for dialysis did not work.  Husband is very angry and saying that patient did not have dialysis for 6 days and something not to be done and we are not doing anything.  Call Dr. Holley Raring, call Dr. Lorenso Courier vascular surgeon on call to have temporary dialysis catheter for her dialysis.  Patient has distended abdomen, anasarca but no shortness of breath.  CHIEF COMPLAINT:   Chief Complaint  Patient presents with  . Vascular Access Problem  . Abdominal Pain   c/o of fullness in the abdomen and stretching of her abdomen. Last dialysis was 6 days ago, patient is having issues with peritoneal dialysis catheter and had a revision operation at [facility] on September 10th, continued to have peritoneal dialysis, peritoneal dialysis catheter flushed today, he was noted about 4 mm milligrams of Activase yesterday patient continues to have sluggish drainage.  Catheter problems, admitted for the same.  Patient husband insisted to use AV fistula last night for dialysis and refused for temporary dialysis catheter.  AV fistula did not work and now husband is angry that her dialysis was 6 days ago.  Patient told me that she did not want Dr. Lucky Cowboy or  Dr. Delana Meyer to touch her AV fistula or put in a temporary catheter.  Now has been requesting to talk to somebody at [facility].  I relayed information to Dr. Holley Raring who can de-escalate the situation and possibly have emergency temporary catheter to have her dialysis. REVIEW OF SYSTEMS:    Review of Systems  Constitutional: Negative for chills and fever.  HENT: Negative for hearing loss.   Eyes: Negative for blurred vision, double vision and photophobia.  Respiratory: Negative for cough, hemoptysis  and shortness of breath.   Cardiovascular: Negative for palpitations, orthopnea and leg swelling.  Gastrointestinal: Positive for abdominal pain. Negative for diarrhea and vomiting.  Genitourinary: Negative for dysuria and urgency.  Musculoskeletal: Negative for myalgias and neck pain.  Skin: Negative for rash.  Neurological: Negative for dizziness, focal weakness, seizures, weakness and headaches.  Psychiatric/Behavioral: Negative for memory loss. The patient does not have insomnia.     Nutrition:  Tolerating Diet: Tolerating PT:      DRUG ALLERGIES:   Allergies  Allergen Reactions  . Penicillins Other (See Comments)    Childhood loss of consciousness - Pt is not allergic Has patient had a PCN reaction causing immediate rash, facial/tongue/throat swelling, SOB or lightheadedness with hypotension: Yes Has patient had a PCN reaction causing severe rash involving mucus membranes or skin necrosis: No Has patient had a PCN reaction that required hospitalization Unknown Has patient had a PCN reaction occurring within the last 10 years: Unknown If all of the above answers are "NO", then may proceed with Cephalosporin use  . Valacyclovir Nausea Only    VITALS:  Blood pressure 133/79, pulse 67, temperature 98.1 F (36.7 C), temperature source Oral, resp. rate 18, height 5' (1.524 m), weight 76 kg, SpO2 100 %.  PHYSICAL EXAMINATION:   Physical Exam  GENERAL:  67 y.o.-year-old patient lying in the bed with no acute distress.  EYES: Pupils equal, round, reactive to light and accommodation. No scleral icterus. Extraocular muscles intact.  HEENT: Head atraumatic, normocephalic. Oropharynx  and nasopharynx clear.  NECK:  Supple, no jugular venous distention. No thyroid enlargement, no tenderness.  LUNGS: Normal breath sounds bilaterally, no wheezing, rales,rhonchi or crepitation. No use of accessory muscles of respiration.  CARDIOVASCULAR: S1, S2 normal. No murmurs, rubs, or gallops.   ABDOMEN: Abdominal bloating, dressings are present in multiple areas of abdomen due to recent PD catheter revision procedure at [facility]  EXTREMITIES: No pedal edema, cyanosis, or clubbing.  NEUROLOGIC: Cranial nerves II through XII are intact. Muscle strength 5/5 in all extremities. Sensation intact. Gait not checked.  PSYCHIATRIC: The patient is alert and oriented x 3.  SKIN: No obvious rash, lesion, or ulcer.    LABORATORY PANEL:   CBC Recent Labs  Lab 04/04/18 1341  WBC 8.0  HGB 9.9*  HCT 28.9*  PLT 183   ------------------------------------------------------------------------------------------------------------------  Chemistries  Recent Labs  Lab 04/05/18 0354  NA 132*  K 4.7  CL 96*  CO2 19*  GLUCOSE 89  BUN 94*  CREATININE 17.74*  CALCIUM 7.0*  AST 12*  ALT <5  ALKPHOS 63  BILITOT 0.6   ------------------------------------------------------------------------------------------------------------------  Cardiac Enzymes No results for input(s): TROPONINI in the last 168 hours. ------------------------------------------------------------------------------------------------------------------  RADIOLOGY:  Dg Abd 1 View  Result Date: 04/06/2018 CLINICAL DATA:  Peritoneal dialysis catheter dysfunction. Abdominal distension. EXAM: ABDOMEN - 1 VIEW COMPARISON:  Supine view of the abdomen dated 04/05/2018. Plain film of the abdomen dated 09/20/2016. FINDINGS: The dialysis catheter is below the lower limits of today's exam. Visualized bowel gas pattern is nonobstructive. No evidence of free intraperitoneal air seen. No radiopaque calculi. No acute or suspicious osseous finding. IMPRESSION: 1. No acute findings.  Nonobstructive bowel gas pattern. 2. Peritoneal dialysis catheter appreciated on yesterday's exam is located below the lower limits of today's exam. Electronically Signed   By: Franki Cabot M.D.   On: 04/06/2018 11:22   Dg Abd 1 View  Result Date:  04/05/2018 CLINICAL DATA:  Peritoneal dialysis catheter not working properly. EXAM: ABDOMEN - 1 VIEW COMPARISON:  09/20/2016 FINDINGS: Peritoneal dialysis catheter enters the left lower abdomen and is looped in the right iliac fossa region. I do not see a kink. Bowel gas pattern is unremarkable. No significant bone finding. IMPRESSION: No evidence of catheter kink. Peritoneal dialysis catheter looped in the right iliac fossa region. Electronically Signed   By: Nelson Chimes M.D.   On: 04/05/2018 16:35     ASSESSMENT AND PLAN:   Active Problems:   Peritoneal dialysis catheter dysfunction ([place])  #1. peritoneal dialysis catheter dysfunction: Still not functional, spoke with Dr. Anthonette Legato, recommends getting temporary dialysis catheter, but husband insisted to use AV fistula as per registered nurse, last night for dialysis, AV fistula did  not work.  2.. History of hypertension: Stable 3.  Hypothyroidism: #4. diabetes mellitus type 2: Patient is on sliding scale insulin with coverage, [place].  Continue sliding scale insulin with coverage at this time.  50% time spent in counseling, coordination of care.    All the records are reviewed and case discussed with Care Management/Social Workerr. Management plans discussed with the patient, family and they are in agreement.  CODE STATUS: full  TOTAL TIME TAKING CARE OF THIS PATIENT: 40 minutes.   POSSIBLE D/C IN 1-2 DAYS, DEPENDING ON CLINICAL CONDITION.   Epifanio Lesches M.D on 04/06/2018 at 12:05 PM  Between 7am to 6pm - Pager - [phone]  After 6pm go to [email or web address] - [facility]  Office  [phone]  CC: Primary care physician; Tracie Harrier, MD

## 2018-04-06 NOTE — Procedures (Signed)
Central Venous Catheter Insertion Procedure Note Tiffancy Moger [id number] [birth year]  Procedure: Insertion of Temporary Hemodialysis Catheter Indications: Hemodialysis  Procedure Details Consent: Risks of procedure as well as the alternatives and risks of each were explained to the (patient/caregiver).  Consent for procedure obtained. Time Out: Verified patient identification, verified procedure, site/side was marked, verified correct patient position, special equipment/implants available, medications/allergies/relevent history reviewed, required imaging and test results available.  Performed  Maximum sterile technique was used including antiseptics, cap, gloves, gown, hand hygiene, mask and sheet. Skin prep: Chlorhexidine; local anesthetic administered . Several attempts bilateral femoral veins- able to access veins with marginal flow- unable to thread wire beyond ~10 cm. Procedure aborted.   Evaluation Blood flow  Complications: No apparent complications Patient did tolerate procedure well.   Sharan Mcenaney A 04/06/2018, 1:38 PM

## 2018-04-06 NOTE — Progress Notes (Signed)
Called [facility] now,waiting to get call back from  hospitalist

## 2018-04-06 NOTE — Progress Notes (Signed)
Patient arrived from [place] via [facility] [facility] orders yet,[facility] notified and Primary.Skin assessed with Anisha R.N-No skin issue except for multiple bruised on upper extremities and to surgical puncture site on her left and right inguinal region which are dressed with 2 x 2 dry gauze and tegaderm.

## 2018-04-06 NOTE — Progress Notes (Signed)
Extensive discussion with patient and husband for 1 hour regarding placement of temporary HD catheter. They wanted to be transferred to [facility] for further care and initially refused care here. After discussion they elected to stay here for Temporary HD catheter. They refused IJ access/placement. After numerous attempts- bilateral femoral approach- there was inability to thread the wire. The procedure was aborted. I had further discussion with the husband and patient regarding placement in the IJ- again refusal. He became angry about her overall care. They request transfer to [facility]. Discussed with Dr. Holley Raring and Dr. Vianne Bulls who will arrange transfer.

## 2018-04-06 NOTE — Progress Notes (Addendum)
husband was angry, wanted  to transfer the patient to [facility], Dr. Holley Raring, charge nurse spoke with the patient, her husband, after discussion,now agreeable to have the catheter placement by Dr. Lorenso Courier  For temp  Dialysis cath  placement and see how she does over the weekend.  Husband, patient are agreeable to stay here to get temporary dialysis catheter, hemodialysis today. Spent more than 50 minutes today in talking to Dr. Holley Raring, Dr. Lorenso Courier, talking to patient, talking to nurse, coordinating care.

## 2018-04-06 NOTE — Discharge Summary (Addendum)
Bethany Lee, is a 67 y.o. female  DOB 04-29-1951  MRN [id number].  Admission date:  04/04/2018  Admitting Physician  Dustin Flock, MD  Discharge Date:  04/06/2018   Primary MD  Tracie Harrier, MD  Recommendations for primary care physician for things to follow:   Transfer to [facility] hospital for dialysis access problem   Admission Diagnosis  Peritoneal dialysis catheter dysfunction, initial encounter [facility]) [H41.740C]   Discharge Diagnosis  Peritoneal dialysis catheter dysfunction, initial encounter [facility]) [X44.818H]    Active Problems:   Peritoneal dialysis catheter dysfunction [facility])      Past Medical History:  Diagnosis Date  . Anemia   . CHF (congestive heart failure) ([place])   . Chronic kidney disease    peritoneal dialysis  . Complication of anesthesia   . Coronary artery disease   . Diabetes mellitus without complication ([place])   . Dialysis patient [facility])    Peritoneal dialysis patient  . Dyspnea    doe  . Edema   . History of recent blood transfusion 01/2017  . Hypercholesterolemia   . Hypertension   . Hypothyroidism   . Neuropathy   . PONV (postoperative nausea and vomiting)   . Shingles    October-November 2017  . Steal syndrome as complication of dialysis access ([place])   . Tremors of nervous system    intermittent when taking gabapentin    Past Surgical History:  Procedure Laterality Date  . A/V FISTULAGRAM Left 01/16/2017   Procedure: A/V Fistulagram;  Surgeon: Katha Cabal, MD;  Location: [place] CV LAB;  Service: Cardiovascular;  Laterality: Left;  . A/V SHUNT INTERVENTION N/A 01/16/2017   Procedure: A/V Shunt Intervention;  Surgeon: Katha Cabal, MD;  Location: [place] CV LAB;  Service: Cardiovascular;  Laterality: N/A;  . ABDOMINAL HYSTERECTOMY     . AV FISTULA PLACEMENT Left 11/30/2016   Procedure: ARTERIOVENOUS (AV) FISTULA CREATION ( EXPLORE FOR CREATION BRACHIOCEPHALIC);  Surgeon: Algernon Huxley, MD;  Location: [facility] ORS;  Service: Vascular;  Laterality: Left;  . CAPD INSERTION N/A 11/25/2015   Procedure: LAPAROSCOPIC INSERTION CONTINUOUS AMBULATORY PERITONEAL DIALYSIS  (CAPD) CATHETER;  Surgeon: Algernon Huxley, MD;  Location: [facility] ORS;  Service: Vascular;  Laterality: N/A;  . CAPD INSERTION N/A 01/06/2016   Procedure: LAPAROSCOPIC INSERTION CONTINUOUS AMBULATORY PERITONEAL DIALYSIS  (CAPD) CATHETER REVISION ;  Surgeon: Algernon Huxley, MD;  Location: [facility] ORS;  Service: Vascular;  Laterality: N/A;  . CAPD INSERTION N/A 10/11/2016   Procedure: LAPAROSCOPIC INSERTION CONTINUOUS AMBULATORY PERITONEAL DIALYSIS  (CAPD) CATHETER ( REVISION );  Surgeon: Algernon Huxley, MD;  Location: [facility] ORS;  Service: Vascular;  Laterality: N/A;  . CARDIAC CATHETERIZATION    . CATARACT EXTRACTION W/PHACO Right 05/25/2016   Procedure: CATARACT EXTRACTION PHACO AND INTRAOCULAR LENS PLACEMENT (IOC);  Surgeon: Eulogio Bear, MD;  Location: [facility] ORS;  Service: Ophthalmology;  Laterality: Right;  Lot # [id number] H [place]: 00:5.2 AP%: 11.3 CDE: 7.21  . CATARACT EXTRACTION W/PHACO Left 12/06/2017   Procedure: CATARACT EXTRACTION PHACO AND INTRAOCULAR LENS PLACEMENT (IOC);  Surgeon: Eulogio Bear, MD;  Location: [facility] ORS;  Service: Ophthalmology;  Laterality: Left;  [place] 00.23.4 AP% 7.9 CDE 1.83 Fluid Pack Lot # [id number]  . COLONOSCOPY WITH PROPOFOL N/A 02/15/2017   Procedure: COLONOSCOPY WITH PROPOFOL;  Surgeon: [name], MD;  Location: [facility]COPY;  Service: Gastroenterology;  Laterality: N/A;  . CORONARY ANGIOPLASTY     STENT  . CORONARY STENT INTERVENTION N/A 09/07/2016  Procedure: Coronary Stent Intervention;  Surgeon: Isaias Cowman, MD;  Location: [place] CV LAB;  Service: Cardiovascular;  Laterality: N/A;  . DIALYSIS/PERMA CATHETER INSERTION N/A 09/21/2016    Procedure: Dialysis/Perma Catheter Insertion;  Surgeon: Algernon Huxley, MD;  Location: [place] CV LAB;  Service: Cardiovascular;  Laterality: N/A;  . DIALYSIS/PERMA CATHETER REMOVAL Right 11/30/2016   Procedure: DIALYSIS/PERMA CATHETER REMOVAL;  Surgeon: Algernon Huxley, MD;  Location: [facility] ORS;  Service: Vascular;  Laterality: Right;  . ESOPHAGOGASTRODUODENOSCOPY (EGD) WITH PROPOFOL N/A 02/15/2017   Procedure: ESOPHAGOGASTRODUODENOSCOPY (EGD) WITH PROPOFOL;  Surgeon: [name], MD;  Location: [facility] ENDOSCOPY;  Service: Gastroenterology;  Laterality: N/A;  . EYE SURGERY    . GIVENS CAPSULE STUDY N/A 02/26/2017   Procedure: GIVENS CAPSULE STUDY;  Surgeon: [name], MD;  Location: [facility] ENDOSCOPY;  Service: Gastroenterology;  Laterality: N/A;  . LEFT HEART CATH AND CORONARY ANGIOGRAPHY Left 09/07/2016   Procedure: Left Heart Cath and Coronary Angiography;  Surgeon: Isaias Cowman, MD;  Location: [place] CV LAB;  Service: Cardiovascular;  Laterality: Left;  . LIGATION OF ARTERIOVENOUS  FISTULA Left 02/07/2017   Procedure: LIGATION OF ARTERIOVENOUS  FISTULA ( BANDING BRACHIAL CEPHALIC );  Surgeon: Katha Cabal, MD;  Location: [facility] ORS;  Service: Vascular;  Laterality: Left;  . TUBAL LIGATION    . UPPER EXTREMITY ANGIOGRAPHY Left 01/19/2017   Procedure: Upper Extremity Angiography;  Surgeon: Katha Cabal, MD;  Location: [place] CV LAB;  Service: Cardiovascular;  Laterality: Left;       History of present illness and  Hospital Course:     Kindly see H&P for history of present illness and admission details, please review complete Labs, Consult reports and Test reports for all details in brief  HPI  from the history and physical done on the day of admission .  67 year old female patient with history of diabetes mellitus type 2, essential hypertension, CAD, drug-eluting stent, ESRD on peritoneal dialysis admitted because of peritoneal dialysis catheter malfunction.     Hospital Course  #1 peritoneal dialysis catheter malfunction: Patient had been on peritoneal dialysis but had.  PD catheter malfunction which which was recently repaired at [facility] comes in because of malfunctioning again, admitted to [facility], seen by nephrology Dr. Anthonette Legato, patient had 4 mg of activase and started within the catheter to see if catheter will work, patient redeployed instilling easily but sluggish.  Patient continues to have significant issues despite normal saline flush, Activase/and observing drainage.  Patient had AV fistula in the left arm antecubital fossa, tried to use left arm AV fistula yesterday night, even with the AV fistula had immediate extravasation of the venous aspect and needle was removed to prevent damage to AV fistula.  Today we consulted vascular surgery on-call Dr. Lorenso Courier who tried to put a temporary HD catheter, and numerous attempts patient monitor pulmonary arteries but unable to thread wire, bilateral groins, Dr. Lorenso Courier with vascular surgery recommended IJ placement .  Has been requested transfer to [facility].  Patient has been very angry about overall care on this patient.  Spoke with Dr. Ernestina Columbia on call regarding the transfer to [facility], he accepted the transfer and patient will go there in the bed is available.   2.  Essential hypertension: Controlled 3.  Diabetes mellitus type 2: Patient on Toujeo at home along with NovoLog.  She received Levemir in the hospital. 4.  Hypothyroidism: Continue Synthyroid.,  dose has been adjusted by PCP recently.  5.  History of ESRD, patient is on continuous peritoneal dialysis at home but last dialysis was 6 days ago, has been having issues with PD catheter.  Discharge Condition: Stable   Follow UP      Discharge Instructions  and  Discharge Medications    Allergies as of 04/06/2018      Reactions   Penicillins Other (See Comments)   Childhood loss of  consciousness - Pt is not allergic Has patient had a PCN reaction causing immediate rash, facial/tongue/throat swelling, SOB or lightheadedness with hypotension: Yes Has patient had a PCN reaction causing severe rash involving mucus membranes or skin necrosis: No Has patient had a PCN reaction that required hospitalization Unknown Has patient had a PCN reaction occurring within the last 10 years: Unknown If all of the above answers are "NO", then may proceed with Cephalosporin use   Valacyclovir Nausea Only      Medication List    STOP taking these medications   calcium carbonate 750 MG chewable tablet Commonly known as:  TUMS EX     TAKE these medications   amLODipine 5 MG tablet Commonly known as:  NORVASC Take 5 mg by mouth daily.   aspirin EC 81 MG tablet Take 1 tablet (81 mg total) by mouth daily. Start taking after NOV 15 What changed:  additional instructions   calcium acetate 667 MG capsule Commonly known as:  PHOSLO Take 2,001 mg by mouth 3 (three) times daily with meals.   carvedilol 12.5 MG tablet Commonly known as:  COREG Take 1 tablet (12.5 mg total) by mouth 2 (two) times daily with a meal. What changed:  how much to take   DIALYVITE 800/ULTRA D Tabs Take 1 tablet by mouth daily.   gentamicin cream 0.1 % Commonly known as:  GARAMYCIN Apply 1 application topically daily.   hydrALAZINE 50 MG tablet Commonly known as:  APRESOLINE Take 50 mg by mouth 2 (two) times daily.   Insulin Glargine 300 UNIT/ML Sopn Inject 20 Units into the skin at bedtime. What changed:  how much to take   iron polysaccharides 150 MG capsule Commonly known as:  NIFEREX Take 150 mg by mouth daily.   levothyroxine 112 MCG tablet Commonly known as:  SYNTHROID, LEVOTHROID Take 112 mcg by mouth daily before breakfast.   lovastatin 40 MG tablet Commonly known as:  MEVACOR Take 40 mg by mouth at bedtime.   NOVOLOG FLEXPEN 100 UNIT/ML FlexPen Generic drug:  insulin  aspart Inject 4-8 Units into the skin 3 (three) times daily with meals. Sliding scale below 200 take 4 units, 200-250=6 units, >250= 8 units   pantoprazole 40 MG tablet Commonly known as:  PROTONIX Take 1 tablet (40 mg total) by mouth 2 (two) times daily.   RENVELA 800 MG tablet Generic drug:  sevelamer carbonate Take 1,600 mg by mouth 3 (three) times daily with meals.   traMADol 50 MG tablet Commonly known as:  ULTRAM Take 1 tablet (50 mg total) by mouth every 8 (eight) hours as needed for moderate pain or severe pain.   urea 20 % cream Commonly known as:  CARMOL Apply 1 application topically daily as needed (for dry skin).         Diet and Activity recommendation: See Discharge Instructions above   Consults obtained -nephrology, vascular surgery   Major procedures and Radiology Reports - PLEASE review detailed and final reports for all details, in brief -      Dg Abd 1 View  Result Date: 04/06/2018 CLINICAL DATA:  Peritoneal dialysis catheter dysfunction. Abdominal distension. EXAM: ABDOMEN - 1 VIEW COMPARISON:  Supine view of the abdomen dated 04/05/2018. Plain film of the abdomen dated 09/20/2016. FINDINGS: The dialysis catheter is below the lower limits of today's exam. Visualized bowel gas pattern is nonobstructive. No evidence of free intraperitoneal air seen. No radiopaque calculi. No acute or suspicious osseous finding. IMPRESSION: 1. No acute findings.  Nonobstructive bowel gas pattern. 2. Peritoneal dialysis catheter appreciated on yesterday's exam is located below the lower limits of today's exam. Electronically Signed   By: Franki Cabot M.D.   On: 04/06/2018 11:22   Dg Abd 1 View  Result Date: 04/05/2018 CLINICAL DATA:  Peritoneal dialysis catheter not working properly. EXAM: ABDOMEN - 1 VIEW COMPARISON:  09/20/2016 FINDINGS: Peritoneal dialysis catheter enters the left lower abdomen and is looped in the right iliac fossa region. I do not see a kink. Bowel gas  pattern is unremarkable. No significant bone finding. IMPRESSION: No evidence of catheter kink. Peritoneal dialysis catheter looped in the right iliac fossa region. Electronically Signed   By: Nelson Chimes M.D.   On: 04/05/2018 16:35    Micro Results     Recent Results (from the past 240 hour(s))  MRSA PCR Screening     Status: None   Collection Time: 04/05/18  8:37 PM  Result Value Ref Range Status   MRSA by PCR NEGATIVE NEGATIVE Final    Comment:        The GeneXpert MRSA Assay (FDA approved for NASAL specimens only), is one component of a comprehensive MRSA colonization surveillance program. It is not intended to diagnose MRSA infection nor to guide or monitor treatment for MRSA infections. Performed at [facility], [address] [place], [place] [address]        Today   Subjective:   Farhiya Rosten has abdominal swelling, multiple abdominal dressing present Objective:   Blood pressure (!) 150/55, pulse 72, temperature 98.1 F (36.7 C), temperature source Oral, resp. rate 16, height 5' (1.524 m), weight 76 kg, SpO2 94 %.   Intake/Output Summary (Last 24 hours) at 04/06/2018 1414 Last data filed at 04/06/2018 1030 Gross per 24 hour  Intake 240 ml  Output 60 ml  Net 180 ml    Exam Awake Alert, Oriented x 3, No new F.N deficits, Normal affect, [place].AT,PERRAL Supple Neck,No JVD, No cervical lymphadenopathy appriciated.  Symmetrical in chest wall movement, Good air movement bilaterally, CTAB RRR,No Gallops,Rubs or new Murmurs, No Parasternal Heave +ve B.Sounds, abdomen distended, multiple abdominal dressings present, she feels like abdominal stretcher because of not having dialysis and also PD catheter flushes   no Cyanosis, has 1+ pitting pedal edema bilaterally., No new Rash or bruise  Data Review   CBC w Diff:  Lab Results  Component Value Date   WBC 8.0 04/04/2018   HGB 9.9 (L) 04/04/2018   HCT 28.9 (L) 04/04/2018   PLT 183 04/04/2018    LYMPHOPCT 11 04/04/2018   MONOPCT 5 04/04/2018   EOSPCT 1 04/04/2018   BASOPCT 0 04/04/2018    CMP:  Lab Results  Component Value Date   NA 132 (L) 04/05/2018   K 4.7 04/05/2018   K 4.8 01/06/2016   CL 96 (L) 04/05/2018   CO2 19 (L) 04/05/2018   BUN 94 (H) 04/05/2018   CREATININE 17.74 (H) 04/05/2018   PROT 5.3 (L) 04/05/2018   ALBUMIN 3.0 (L) 04/05/2018   BILITOT 0.6 04/05/2018   ALKPHOS  63 04/05/2018   AST 12 (L) 04/05/2018   ALT <5 04/05/2018  .   Total Time in preparing paper work, data evaluation and todays exam - 35 minutes  Epifanio Lesches M.D on 04/06/2018 at 2:14 PM    Note: This dictation was prepared with Dragon dictation along with smaller phrase technology. Any transcriptional errors that result from this process are unintentional.

## 2018-04-07 ENCOUNTER — Observation Stay (HOSPITAL_COMMUNITY): Payer: Medicare HMO

## 2018-04-07 ENCOUNTER — Encounter (HOSPITAL_COMMUNITY): Payer: Self-pay | Admitting: Anesthesiology

## 2018-04-07 ENCOUNTER — Encounter (HOSPITAL_COMMUNITY): Payer: Medicare HMO

## 2018-04-07 ENCOUNTER — Observation Stay (HOSPITAL_COMMUNITY): Payer: Medicare HMO | Admitting: Anesthesiology

## 2018-04-07 ENCOUNTER — Encounter (HOSPITAL_COMMUNITY)
Admission: AD | Disposition: A | Discharge status: Home / Self Care | Payer: Self-pay | Source: Other Acute Inpatient Hospital | Attending: Internal Medicine

## 2018-04-07 DIAGNOSIS — Z7982 Long term (current) use of aspirin: Secondary | ICD-10-CM | POA: Diagnosis not present

## 2018-04-07 DIAGNOSIS — T85611D Breakdown (mechanical) of intraperitoneal dialysis catheter, subsequent encounter: Secondary | ICD-10-CM | POA: Diagnosis not present

## 2018-04-07 DIAGNOSIS — Z794 Long term (current) use of insulin: Secondary | ICD-10-CM | POA: Diagnosis not present

## 2018-04-07 DIAGNOSIS — E1142 Type 2 diabetes mellitus with diabetic polyneuropathy: Secondary | ICD-10-CM | POA: Diagnosis present

## 2018-04-07 DIAGNOSIS — E872 Acidosis, unspecified: Secondary | ICD-10-CM | POA: Diagnosis present

## 2018-04-07 DIAGNOSIS — R188 Other ascites: Secondary | ICD-10-CM | POA: Diagnosis present

## 2018-04-07 DIAGNOSIS — R0600 Dyspnea, unspecified: Secondary | ICD-10-CM | POA: Diagnosis present

## 2018-04-07 DIAGNOSIS — I132 Hypertensive heart and chronic kidney disease with heart failure and with stage 5 chronic kidney disease, or end stage renal disease: Secondary | ICD-10-CM | POA: Diagnosis present

## 2018-04-07 DIAGNOSIS — K219 Gastro-esophageal reflux disease without esophagitis: Secondary | ICD-10-CM | POA: Diagnosis present

## 2018-04-07 DIAGNOSIS — I2510 Atherosclerotic heart disease of native coronary artery without angina pectoris: Secondary | ICD-10-CM | POA: Diagnosis present

## 2018-04-07 DIAGNOSIS — I5032 Chronic diastolic (congestive) heart failure: Secondary | ICD-10-CM | POA: Diagnosis present

## 2018-04-07 DIAGNOSIS — Z452 Encounter for adjustment and management of vascular access device: Secondary | ICD-10-CM | POA: Diagnosis not present

## 2018-04-07 DIAGNOSIS — R140 Abdominal distension (gaseous): Secondary | ICD-10-CM | POA: Diagnosis not present

## 2018-04-07 DIAGNOSIS — E871 Hypo-osmolality and hyponatremia: Secondary | ICD-10-CM | POA: Diagnosis present

## 2018-04-07 DIAGNOSIS — E875 Hyperkalemia: Secondary | ICD-10-CM | POA: Diagnosis present

## 2018-04-07 DIAGNOSIS — T82898A Other specified complication of vascular prosthetic devices, implants and grafts, initial encounter: Secondary | ICD-10-CM | POA: Diagnosis present

## 2018-04-07 DIAGNOSIS — M7989 Other specified soft tissue disorders: Secondary | ICD-10-CM | POA: Diagnosis not present

## 2018-04-07 DIAGNOSIS — Z87891 Personal history of nicotine dependence: Secondary | ICD-10-CM | POA: Diagnosis not present

## 2018-04-07 DIAGNOSIS — Z880 Allergy status to penicillin: Secondary | ICD-10-CM | POA: Diagnosis not present

## 2018-04-07 DIAGNOSIS — E440 Moderate protein-calorie malnutrition: Secondary | ICD-10-CM | POA: Diagnosis present

## 2018-04-07 DIAGNOSIS — E039 Hypothyroidism, unspecified: Secondary | ICD-10-CM | POA: Diagnosis present

## 2018-04-07 DIAGNOSIS — D631 Anemia in chronic kidney disease: Secondary | ICD-10-CM | POA: Diagnosis present

## 2018-04-07 DIAGNOSIS — N2581 Secondary hyperparathyroidism of renal origin: Secondary | ICD-10-CM | POA: Diagnosis present

## 2018-04-07 DIAGNOSIS — N189 Chronic kidney disease, unspecified: Secondary | ICD-10-CM | POA: Diagnosis not present

## 2018-04-07 DIAGNOSIS — E7800 Pure hypercholesterolemia, unspecified: Secondary | ICD-10-CM | POA: Diagnosis present

## 2018-04-07 DIAGNOSIS — Z79899 Other long term (current) drug therapy: Secondary | ICD-10-CM | POA: Diagnosis not present

## 2018-04-07 DIAGNOSIS — N185 Chronic kidney disease, stage 5: Secondary | ICD-10-CM

## 2018-04-07 DIAGNOSIS — E1122 Type 2 diabetes mellitus with diabetic chronic kidney disease: Secondary | ICD-10-CM | POA: Diagnosis present

## 2018-04-07 DIAGNOSIS — Z6830 Body mass index (BMI) 30.0-30.9, adult: Secondary | ICD-10-CM | POA: Diagnosis not present

## 2018-04-07 DIAGNOSIS — Y812 Prosthetic and other implants, materials and accessory general- and plastic-surgery devices associated with adverse incidents: Secondary | ICD-10-CM | POA: Diagnosis present

## 2018-04-07 DIAGNOSIS — N186 End stage renal disease: Secondary | ICD-10-CM | POA: Diagnosis present

## 2018-04-07 DIAGNOSIS — T85611A Breakdown (mechanical) of intraperitoneal dialysis catheter, initial encounter: Secondary | ICD-10-CM | POA: Diagnosis present

## 2018-04-07 DIAGNOSIS — Z992 Dependence on renal dialysis: Secondary | ICD-10-CM | POA: Diagnosis not present

## 2018-04-07 HISTORY — PX: INSERTION OF DIALYSIS CATHETER: SHX1324

## 2018-04-07 LAB — GLUCOSE, CAPILLARY
Glucose-Capillary: 106 mg/dL — ABNORMAL HIGH (ref 70–99)
Glucose-Capillary: 121 mg/dL — ABNORMAL HIGH (ref 70–99)
Glucose-Capillary: 124 mg/dL — ABNORMAL HIGH (ref 70–99)
Glucose-Capillary: 67 mg/dL — ABNORMAL LOW (ref 70–99)
Glucose-Capillary: 75 mg/dL (ref 70–99)
Glucose-Capillary: 92 mg/dL (ref 70–99)
Glucose-Capillary: 93 mg/dL (ref 70–99)
Glucose-Capillary: 93 mg/dL (ref 70–99)

## 2018-04-07 LAB — IRON AND TIBC
Iron: 32 ug/dL (ref 28–170)
Saturation Ratios: 16 % (ref 10.4–31.8)
TIBC: 196 ug/dL — AB (ref 250–450)
UIBC: 164 ug/dL

## 2018-04-07 LAB — COMPREHENSIVE METABOLIC PANEL
ALK PHOS: 69 U/L (ref 38–126)
ANION GAP: 23 — AB (ref 5–15)
AST: 20 U/L (ref 15–41)
Albumin: 2.8 g/dL — ABNORMAL LOW (ref 3.5–5.0)
BILIRUBIN TOTAL: 0.7 mg/dL (ref 0.3–1.2)
BUN: 114 mg/dL — AB (ref 8–23)
CALCIUM: 6.5 mg/dL — AB (ref 8.9–10.3)
CO2: 13 mmol/L — ABNORMAL LOW (ref 22–32)
CREATININE: 20.85 mg/dL — AB (ref 0.44–1.00)
Chloride: 90 mmol/L — ABNORMAL LOW (ref 98–111)
GFR calc Af Amer: 2 mL/min — ABNORMAL LOW (ref >60)
GFR calc non Af Amer: 1 mL/min — ABNORMAL LOW (ref >60)
GLUCOSE: 85 mg/dL (ref 70–99)
Potassium: 6.2 mmol/L — ABNORMAL HIGH (ref 3.5–5.1)
Sodium: 126 mmol/L — ABNORMAL LOW (ref 135–145)
TOTAL PROTEIN: 5.1 g/dL — AB (ref 6.5–8.1)

## 2018-04-07 LAB — CBC
HCT: 23 % — ABNORMAL LOW (ref 36.0–46.0)
Hemoglobin: 7.2 g/dL — ABNORMAL LOW (ref 12.0–15.0)
MCH: 33.3 pg (ref 26.0–34.0)
MCHC: 31.3 g/dL (ref 30.0–36.0)
MCV: 106.5 fL — ABNORMAL HIGH (ref 78.0–100.0)
PLATELETS: 106 10*3/uL — AB (ref 150–400)
RBC: 2.16 MIL/uL — AB (ref 3.87–5.11)
RDW: 16.4 % — ABNORMAL HIGH (ref 11.5–15.5)
WBC: 9.9 10*3/uL (ref 4.0–10.5)

## 2018-04-07 LAB — PROTIME-INR
INR: 1.15
PROTHROMBIN TIME: 14.6 s (ref 11.4–15.2)

## 2018-04-07 LAB — FERRITIN: Ferritin: 632 ng/mL — ABNORMAL HIGH (ref 11–307)

## 2018-04-07 LAB — HEPATITIS B SURFACE ANTIGEN: Hepatitis B Surface Ag: NEGATIVE

## 2018-04-07 LAB — HIV ANTIBODY (ROUTINE TESTING W REFLEX): HIV Screen 4th Generation wRfx: NONREACTIVE

## 2018-04-07 LAB — PHOSPHORUS: Phosphorus: 10.5 mg/dL — ABNORMAL HIGH (ref 2.5–4.6)

## 2018-04-07 LAB — PARATHYROID HORMONE, INTACT (NO CA): PTH: 82 pg/mL — AB (ref 15–65)

## 2018-04-07 SURGERY — INSERTION OF DIALYSIS CATHETER
Anesthesia: General

## 2018-04-07 MED ORDER — MIDAZOLAM HCL 5 MG/5ML IJ SOLN
INTRAMUSCULAR | Status: DC | PRN
  Administered 2018-04-07 (×3): 0.5 mg via INTRAVENOUS

## 2018-04-07 MED ORDER — DEXTROSE 50 % IV SOLN
INTRAVENOUS | Status: AC
  Filled 2018-04-07: qty 50

## 2018-04-07 MED ORDER — CHLORHEXIDINE GLUCONATE CLOTH 2 % EX PADS
6.0000 | MEDICATED_PAD | Freq: Every day | CUTANEOUS | Status: DC
  Administered 2018-04-09 – 2018-04-10 (×2): 6 via TOPICAL

## 2018-04-07 MED ORDER — LIDOCAINE-EPINEPHRINE 0.5 %-1:200000 IJ SOLN
INTRAMUSCULAR | Status: AC
  Filled 2018-04-07: qty 1

## 2018-04-07 MED ORDER — DARBEPOETIN ALFA 60 MCG/0.3ML IJ SOSY
60.0000 ug | PREFILLED_SYRINGE | Freq: Once | INTRAMUSCULAR | Status: AC
  Administered 2018-04-07: 60 ug via INTRAVENOUS

## 2018-04-07 MED ORDER — FENTANYL CITRATE (PF) 250 MCG/5ML IJ SOLN
INTRAMUSCULAR | Status: DC | PRN
  Administered 2018-04-07: 50 ug via INTRAVENOUS

## 2018-04-07 MED ORDER — FENTANYL CITRATE (PF) 250 MCG/5ML IJ SOLN
INTRAMUSCULAR | Status: AC
  Filled 2018-04-07: qty 5

## 2018-04-07 MED ORDER — MIDAZOLAM HCL 2 MG/2ML IJ SOLN
INTRAMUSCULAR | Status: AC
  Filled 2018-04-07: qty 2

## 2018-04-07 MED ORDER — DARBEPOETIN ALFA 60 MCG/0.3ML IJ SOSY
PREFILLED_SYRINGE | INTRAMUSCULAR | Status: AC
  Filled 2018-04-07: qty 0.3

## 2018-04-07 MED ORDER — FENTANYL CITRATE (PF) 100 MCG/2ML IJ SOLN: 25.0000 ug | INTRAMUSCULAR | Status: DC | PRN

## 2018-04-07 MED ORDER — SODIUM CHLORIDE 0.9 % IV SOLN
INTRAVENOUS | Status: DC | PRN
  Administered 2018-04-07: 500 mL

## 2018-04-07 MED ORDER — HEPARIN SODIUM (PORCINE) 1000 UNIT/ML IJ SOLN
INTRAMUSCULAR | Status: AC
  Filled 2018-04-07: qty 1

## 2018-04-07 MED ORDER — 0.9 % SODIUM CHLORIDE (POUR BTL) OPTIME
TOPICAL | Status: DC | PRN
  Administered 2018-04-07: 1000 mL

## 2018-04-07 MED ORDER — CEFAZOLIN SODIUM-DEXTROSE 2-3 GM-%(50ML) IV SOLR
INTRAVENOUS | Status: DC | PRN
  Administered 2018-04-07: 2 g via INTRAVENOUS

## 2018-04-07 MED ORDER — PROPOFOL 10 MG/ML IV BOLUS
INTRAVENOUS | Status: AC
  Filled 2018-04-07: qty 40

## 2018-04-07 MED ORDER — HYDROMORPHONE HCL 1 MG/ML IJ SOLN
INTRAMUSCULAR | Status: AC
  Filled 2018-04-07: qty 1

## 2018-04-07 MED ORDER — HEPARIN SODIUM (PORCINE) 1000 UNIT/ML IJ SOLN
INTRAMUSCULAR | Status: DC | PRN
  Administered 2018-04-07: 3400 [IU] via INTRAVENOUS

## 2018-04-07 MED ORDER — CHLORHEXIDINE GLUCONATE CLOTH 2 % EX PADS
6.0000 | MEDICATED_PAD | Freq: Every day | CUTANEOUS | Status: DC
  Administered 2018-04-08 – 2018-04-10 (×2): 6 via TOPICAL

## 2018-04-07 MED ORDER — LIDOCAINE-EPINEPHRINE 0.5 %-1:200000 IJ SOLN
INTRAMUSCULAR | Status: DC | PRN
  Administered 2018-04-07: 50 mL

## 2018-04-07 MED ORDER — PATIROMER SORBITEX CALCIUM 8.4 G PO PACK
16.8000 g | PACK | Freq: Every day | ORAL | Status: AC
  Filled 2018-04-07: qty 2

## 2018-04-07 SURGICAL SUPPLY — 42 items
BAG DECANTER FOR FLEXI CONT (MISCELLANEOUS) ×3 IMPLANT
BIOPATCH RED 1 DISK 7.0 (GAUZE/BANDAGES/DRESSINGS) ×2 IMPLANT
BIOPATCH RED 1IN DISK 7.0MM (GAUZE/BANDAGES/DRESSINGS) ×1
CATH PALINDROME RT-P 15FX19CM (CATHETERS) IMPLANT
CATH PALINDROME RT-P 15FX23CM (CATHETERS) ×2 IMPLANT
CATH PALINDROME RT-P 15FX28CM (CATHETERS) IMPLANT
CATH PALINDROME RT-P 15FX55CM (CATHETERS) IMPLANT
COVER PROBE W GEL 5X96 (DRAPES) ×3 IMPLANT
COVER SURGICAL LIGHT HANDLE (MISCELLANEOUS) ×3 IMPLANT
DECANTER SPIKE VIAL GLASS SM (MISCELLANEOUS) ×3 IMPLANT
DERMABOND ADVANCED (GAUZE/BANDAGES/DRESSINGS) ×2
DERMABOND ADVANCED .7 DNX12 (GAUZE/BANDAGES/DRESSINGS) ×1 IMPLANT
DRAPE C-ARM 42X72 X-RAY (DRAPES) ×3 IMPLANT
DRAPE CHEST BREAST 15X10 FENES (DRAPES) ×3 IMPLANT
GAUZE 4X4 16PLY RFD (DISPOSABLE) ×3 IMPLANT
GLOVE BIOGEL M 6.5 STRL (GLOVE) ×2 IMPLANT
GLOVE BIOGEL PI IND STRL 7.0 (GLOVE) IMPLANT
GLOVE BIOGEL PI INDICATOR 7.0 (GLOVE) ×6
GLOVE SS BIOGEL STRL SZ 7.5 (GLOVE) ×1 IMPLANT
GLOVE SUPERSENSE BIOGEL SZ 7.5 (GLOVE) ×2
GOWN STRL REUS W/ TWL LRG LVL3 (GOWN DISPOSABLE) ×2 IMPLANT
GOWN STRL REUS W/TWL LRG LVL3 (GOWN DISPOSABLE) ×4
KIT BASIN OR (CUSTOM PROCEDURE TRAY) ×3 IMPLANT
KIT TURNOVER KIT B (KITS) ×3 IMPLANT
NDL 18GX1X1/2 (RX/OR ONLY) (NEEDLE) ×1 IMPLANT
NDL HYPO 25GX1X1/2 BEV (NEEDLE) ×1 IMPLANT
NEEDLE 18GX1X1/2 (RX/OR ONLY) (NEEDLE) ×3 IMPLANT
NEEDLE 22X1 1/2 (OR ONLY) (NEEDLE) IMPLANT
NEEDLE HYPO 25GX1X1/2 BEV (NEEDLE) ×3 IMPLANT
NS IRRIG 1000ML POUR BTL (IV SOLUTION) ×3 IMPLANT
PACK SURGICAL SETUP 50X90 (CUSTOM PROCEDURE TRAY) ×3 IMPLANT
PAD ARMBOARD 7.5X6 YLW CONV (MISCELLANEOUS) ×6 IMPLANT
SOAP 2 % CHG 4 OZ (WOUND CARE) ×3 IMPLANT
SUT ETHILON 3 0 PS 1 (SUTURE) ×3 IMPLANT
SUT VICRYL 4-0 PS2 18IN ABS (SUTURE) ×3 IMPLANT
SYR 10ML LL (SYRINGE) ×3 IMPLANT
SYR 20CC LL (SYRINGE) ×3 IMPLANT
SYR 5ML LL (SYRINGE) ×6 IMPLANT
SYR CONTROL 10ML LL (SYRINGE) ×3 IMPLANT
TOWEL GREEN STERILE (TOWEL DISPOSABLE) ×6 IMPLANT
TOWEL GREEN STERILE FF (TOWEL DISPOSABLE) ×3 IMPLANT
WATER STERILE IRR 1000ML POUR (IV SOLUTION) ×3 IMPLANT

## 2018-04-07 NOTE — Progress Notes (Signed)
Spoke with "Breck" in pharmacy HT:[id number] of [place]. Still awaiting response from [place], [place]. Per pharmacist, will hold med and reorder if PA/MD still wants med given.

## 2018-04-07 NOTE — CV Procedure (Signed)
    OPERATIVE REPORT  DATE OF SURGERY: 04/07/2018  PATIENT: Bethany Lee, 67 y.o. female MRN: [id number]  DOB: [birth year]  PRE-OPERATIVE DIAGNOSIS: End-stage renal disease  POST-OPERATIVE DIAGNOSIS:  Same  PROCEDURE: Right IJ tunneled hemodialysis catheter with SonoSite visualization  SURGEON:  Curt Jews, M.D.  PHYSICIAN ASSISTANT: Nurse  ANESTHESIA: Local with sedation  EBL: per anesthesia record  No intake/output data recorded.  BLOOD ADMINISTERED: none  DRAINS: none  SPECIMEN: none  COUNTS CORRECT:  YES  PATIENT DISPOSITION:  PACU - hemodynamically stable  PROCEDURE DETAILS: Patient was taken to the operating placed supine position where the area of the right and left neck which with a sinus site ultrasound which revealed widely patent internal jugular veins bilaterally.  The right and left neck and chest prepped and draped you sterile fashion.  Using SonoSite visualization and an 18-gauge needle under local anesthesia the right internal jugular vein was easily accessed and a guidewire was passed centrally.  Fluoroscopy was used to confirm that this was indeed appropriate placement.  A dilator and peel-away sheath was passed over the guidewire and the dilator and guidewire removed.  A 23 cm tunnel catheter was positioned through the peel-away sheath which was removed.  The catheter tips were positioned to the level of the distal right atrium.  The catheter was brought through a subcutaneous tunnel and a separate stab incision and the 2 lm ports were attached.  Both lumens flushed and aspirated easily and were locked with 1000/cc heparin.  The catheter was secured to the skin with a 3-0 nylon stitch and the entry site was closed with a 4-0 subcuticular Vicryl stitch.  Sterile dressing was applied and the patient was transferred to the recovery room in stable condition with chest x-ray pending   Rosetta Posner, M.D., [facility] 04/07/2018 9:08 AM

## 2018-04-07 NOTE — Anesthesia Postprocedure Evaluation (Signed)
Anesthesia Post Note  Patient: Bethany Lee  Procedure(s) Performed: INSERTION OF DIALYSIS CATHETER (N/A )     Patient location during evaluation: PACU Anesthesia Type: General Level of consciousness: awake and alert Pain management: pain level controlled Vital Signs Assessment: post-procedure vital signs reviewed and stable Respiratory status: spontaneous breathing, nonlabored ventilation, respiratory function stable and patient connected to nasal cannula oxygen Cardiovascular status: stable and blood pressure returned to baseline Postop Assessment: no apparent nausea or vomiting Anesthetic complications: no    Last Vitals:  Vitals:   04/07/18 0955 04/07/18 1020  BP: (!) 145/57 138/60  Pulse: 66 68  Resp: 20 20  Temp: (!) 36.1 C (!) 36.2 C  SpO2: 99% 98%    Last Pain:  Vitals:   04/07/18 1020  TempSrc: Oral  PainSc:                  Tiajuana Amass

## 2018-04-07 NOTE — Anesthesia Preprocedure Evaluation (Addendum)
Anesthesia Evaluation  Patient identified by MRN, date of birth, ID band Patient awake    Reviewed: Allergy & Precautions, NPO status , Patient's Chart, lab work & pertinent test results  History of Anesthesia Complications (+) PONV  Airway Mallampati: II  TM Distance: >3 FB Neck ROM: Full    Dental  (+) Dental Advisory Given   Pulmonary former smoker,    breath sounds clear to auscultation       Cardiovascular hypertension, Pt. on medications and Pt. on home beta blockers + CAD, + Cardiac Stents, + Peripheral Vascular Disease and +CHF (EF 45-50%)   Rhythm:Regular Rate:Normal     Neuro/Psych  Neuromuscular disease    GI/Hepatic negative GI ROS, Neg liver ROS,   Endo/Other  diabetes, Insulin DependentHypothyroidism   Renal/GU ESRF and DialysisRenal disease     Musculoskeletal   Abdominal   Peds  Hematology  (+) anemia ,   Anesthesia Other Findings   Reproductive/Obstetrics                             Anesthesia Physical Anesthesia Plan  ASA: IV  Anesthesia Plan: MAC   Post-op Pain Management:    Induction: Intravenous  PONV Risk Score and Plan: Ondansetron, Treatment may vary due to age or medical condition and Propofol infusion  Airway Management Planned: Mask and Natural Airway  Additional Equipment:   Intra-op Plan:   Post-operative Plan: Extubation in OR  Informed Consent: I have reviewed the patients History and Physical, chart, labs and discussed the procedure including the risks, benefits and alternatives for the proposed anesthesia with the patient or authorized representative who has indicated his/her understanding and acceptance.   Dental advisory given  Plan Discussed with: CRNA, Anesthesiologist and Surgeon  Anesthesia Plan Comments:       Anesthesia Quick Evaluation

## 2018-04-07 NOTE — Progress Notes (Addendum)
[facility] PROGRESS NOTE  Bryson Palen CBJ:[id number] DOB: 11-01-1950 DOA: 04/06/2018 PCP: Tracie Harrier, MD  Assessment/Plan:  1. PD dialysis catheter dysfunction. IJ catheter insert per vascular surgery today. Per chart has complicated access history I.e. PD dialysis catheter which poorly functions and left upper arm fistula that has required several revisions due to severe steal syndrome. Chart indicates IJ could be used temporarily if her PD catheter works. She is to follow up with her access surgeon at [place]  2. ESRD PD. See #1. No dialysis for 7 days. Clearly volume overloaded. Abdomen quite distended. Left upper arm small amount oozing.  Dialysis orders written. Sodium 126, potassium 6.7 creatinine 20.8. Continue daily weight. Will likely need serial dialysis Appreciate nephrology assistance.  3. DM. Poor control. cbg's low end of normal.  Home meds include Toujeo with novelog. Holding Toujeo for now.  Using SSI for optimal control. A1c pending  4. Anemia. Likely of chronic disease. Hg 7.2. Down from 9.9. Likely related to recent procedure. Continue home meds  5. CAD. No chest pain. No events on tele. Continue home BB and asa  6. Hypertension. Somewhat elevated related to above. Continue home meds  7. Hypothyroidism continue home meds  #8. Abdominal distention/ metabolic acidosis/dyspnea. Likely related to volume overload in absence of dialysis past several days. Abdomen quite distended and tight, chest xray with vascular congestion, hyponatremia, anion gap23, serum CO2 13. Oxygen saturation level greater 90% on room air. Will need serial dialysis. Monitor oxygen saturation level and provide supplemental oxygen as needed. Monitor electrolytes. Request IR evaluation for paracentesis. Npo past midnight   Code Status: full Family Communication:  Disposition Plan:    Consultants:  Dr early vascular surgery  Dr Noel Journey nephrology  Procedures:  Insertion of  IJ dialysis catheter  Dialysis 9/15  Antibiotics:  none  HPI/Subjective: 67 year old hx DM, hypertension, chf, cad, esrd on PD for 2 years transferred [facility] from [place] per family request after PD catheter malfunction. Evidently problem with AV fistula cannulation and insertion of femoral dialysis catheter. Patient was advised to have IJ placed to continue dialysis. Family requested transfer to [facility]  Objective: Vitals:   04/07/18 0955 04/07/18 1020  BP: (!) 145/57 138/60  Pulse: 66 68  Resp: 20 20  Temp: (!) 97 F (36.1 C) (!) 97.2 F (36.2 C)  SpO2: 99% 98%    Intake/Output Summary (Last 24 hours) at 04/07/2018 1358 Last data filed at 04/07/2018 0915 Gross per 24 hour  Intake 290 ml  Output 426 ml  Net -136 ml   There were no vitals filed for this visit.  Exam:   General:  Somewhat lethargic, sitting in chair ill appearing but in no acute distrss  Cardiovascular: RRR HS distant no MGR trace LE edema, face puffy  Respiratory: mild increased work of breathing with conversation, respirations somewhat shallow, decreased BS no wheeze  Abdomen: quite distended and tight. Very sluggish BS mild diffuse tenderness no guarding or rebounding  Musculoskeletal: joint without swelling/erythems. Mild oozing left upper arm. Right IF site clean and dry.    Data Reviewed: Basic Metabolic Panel: Recent Labs  Lab 04/04/18 1341 04/05/18 0354 04/05/18 2037 04/06/18 1949 04/07/18 0728 04/07/18 1134  NA 132* 132*  --   --  QUESTIONABLE RESULTS, RECOMMEND RECOLLECT TO VERIFY 126*  K 4.5 4.7  --   --  QUESTIONABLE RESULTS, RECOMMEND RECOLLECT TO VERIFY 6.2*  CL 95* 96*  --   --  QUESTIONABLE RESULTS, RECOMMEND RECOLLECT  TO VERIFY 90*  CO2 19* 19*  --   --  QUESTIONABLE RESULTS, RECOMMEND RECOLLECT TO VERIFY 13*  GLUCOSE 163* 89  --   --  QUESTIONABLE RESULTS, RECOMMEND RECOLLECT TO VERIFY 85  BUN 85* 94*  --   --  QUESTIONABLE RESULTS, RECOMMEND RECOLLECT TO VERIFY 114*   CREATININE 16.85* 17.74*  --  20.34* QUESTIONABLE RESULTS, RECOMMEND RECOLLECT TO VERIFY 20.85*  CALCIUM 7.0* 7.0*  --   --  QUESTIONABLE RESULTS, RECOMMEND RECOLLECT TO VERIFY 6.5*  PHOS  --   --  9.0*  --   --  10.5*   Liver Function Tests: Recent Labs  Lab 04/04/18 1341 04/05/18 0354 04/07/18 0728 04/07/18 1134  AST 16 12* QUESTIONABLE RESULTS, RECOMMEND RECOLLECT TO VERIFY 20  ALT <5 <5 QUESTIONABLE RESULTS, RECOMMEND RECOLLECT TO VERIFY <5  ALKPHOS 64 63 QUESTIONABLE RESULTS, RECOMMEND RECOLLECT TO VERIFY 69  BILITOT 0.6 0.6 QUESTIONABLE RESULTS, RECOMMEND RECOLLECT TO VERIFY 0.7  PROT 5.9* 5.3* QUESTIONABLE RESULTS, RECOMMEND RECOLLECT TO VERIFY 5.1*  ALBUMIN 3.2* 3.0* QUESTIONABLE RESULTS, RECOMMEND RECOLLECT TO VERIFY 2.8*   No results for input(s): LIPASE, AMYLASE in the last 168 hours. No results for input(s): AMMONIA in the last 168 hours. CBC: Recent Labs  Lab 04/04/18 1341 04/07/18 0728  WBC 8.0 9.9  NEUTROABS 6.7*  --   HGB 9.9* 7.2*  HCT 28.9* 23.0*  MCV 98.3 106.5*  PLT 183 106*   Cardiac Enzymes: No results for input(s): CKTOTAL, CKMB, CKMBINDEX, TROPONINI in the last 168 hours. BNP (last 3 results) No results for input(s): BNP in the last 8760 hours.  ProBNP (last 3 results) No results for input(s): PROBNP in the last 8760 hours.  CBG: Recent Labs  Lab 04/07/18 0015 04/07/18 0443 04/07/18 0723 04/07/18 0922 04/07/18 1156  GLUCAP 92 75 67* 121* 93    Recent Results (from the past 240 hour(s))  MRSA PCR Screening     Status: None   Collection Time: 04/05/18  8:37 PM  Result Value Ref Range Status   MRSA by PCR NEGATIVE NEGATIVE Final    Comment:        The GeneXpert MRSA Assay (FDA approved for NASAL specimens only), is one component of a comprehensive MRSA colonization surveillance program. It is not intended to diagnose MRSA infection nor to guide or monitor treatment for MRSA infections. Performed at [facility], [address] [place], [place] [address]      Studies: Dg Chest 1 View  Result Date: 04/07/2018 CLINICAL DATA:  Encounter for central line placement EXAM: CHEST  1 VIEW COMPARISON:  02/11/2017 FINDINGS: Dialysis catheter on the right with tip at the lower right atrium. Cardiomegaly. Lower lung volumes with vascular congestion and crowding. Left subclavian stenting. IMPRESSION: 1. Dialysis catheter with tip at the right atrium. 2. Low volume chest with interstitial crowding and vascular congestion. Electronically Signed   By: Monte Fantasia M.D.   On: 04/07/2018 09:29   Dg Abd 1 View  Result Date: 04/06/2018 CLINICAL DATA:  Peritoneal dialysis catheter dysfunction. Abdominal distension. EXAM: ABDOMEN - 1 VIEW COMPARISON:  Supine view of the abdomen dated 04/05/2018. Plain film of the abdomen dated 09/20/2016. FINDINGS: The dialysis catheter is below the lower limits of today's exam. Visualized bowel gas pattern is nonobstructive. No evidence of free intraperitoneal air seen. No radiopaque calculi. No acute or suspicious osseous finding. IMPRESSION: 1. No acute findings.  Nonobstructive bowel gas pattern. 2. Peritoneal dialysis catheter appreciated on yesterday's exam is located below  the lower limits of today's exam. Electronically Signed   By: Franki Cabot M.D.   On: 04/06/2018 11:22   Dg Abd 1 View  Result Date: 04/05/2018 CLINICAL DATA:  Peritoneal dialysis catheter not working properly. EXAM: ABDOMEN - 1 VIEW COMPARISON:  09/20/2016 FINDINGS: Peritoneal dialysis catheter enters the left lower abdomen and is looped in the right iliac fossa region. I do not see a kink. Bowel gas pattern is unremarkable. No significant bone finding. IMPRESSION: No evidence of catheter kink. Peritoneal dialysis catheter looped in the right iliac fossa region. Electronically Signed   By: Nelson Chimes M.D.   On: 04/05/2018 16:35   Dg Fluoro Guide Cv Line-no Report  Result Date: 04/07/2018 Fluoroscopy was utilized by  the requesting physician.  No radiographic interpretation.    Scheduled Meds: . amLODipine  5 mg Oral Daily  . aspirin EC  81 mg Oral Daily  . calcium acetate  2,001 mg Oral TID WC  . carvedilol  25 mg Oral BID WC  . Chlorhexidine Gluconate Cloth  6 each Topical Q0600  . Chlorhexidine Gluconate Cloth  6 each Topical Q0600  . darbepoetin (ARANESP) injection - DIALYSIS  60 mcg Intravenous Once in dialysis  . dextrose      . heparin  5,000 Units Subcutaneous Q8H  . hydrALAZINE  50 mg Oral BID  . insulin aspart  0-9 Units Subcutaneous Q4H  . iron polysaccharides  150 mg Oral Daily  . levothyroxine  112 mcg Oral QAC breakfast  . multivitamin  1 tablet Oral QHS  . pantoprazole  40 mg Oral BID  . patiromer  16.8 g Oral Daily  . pravastatin  40 mg Oral q1800  . sevelamer carbonate  1,600 mg Oral TID WC  . sodium chloride flush  3 mL Intravenous Q12H   Continuous Infusions: . sodium chloride 250 mL (04/06/18 2121)    Principal Problem:   Peritoneal dialysis catheter dysfunction (HCC) Active Problems:   CKD (chronic kidney disease) stage 5, GFR less than 15 ml/min (HCC)   Abdominal distention   Anemia in chronic renal disease   Hyperkalemia   CAD (coronary artery disease)   DM type 2 with diabetic peripheral neuropathy (HCC)   Hypothyroidism (acquired)   Hyponatremia   Dyspnea   Acidosis   Steal syndrome as complication of dialysis access [facility])    Time spent: 65 minutes    [facility]  If 7PM-7AM, please contact night-coverage at [email or web address], password [facility] 04/07/2018, 1:58 PM  LOS: 1 day

## 2018-04-07 NOTE — Consult Note (Signed)
[facility] Renal Consultation Note    Indication for Consultation:  Management of ESRD/hemodialysis; anemia, hypertension/volume and secondary hyperparathyroidism  [id number], Vishwanath, MD  HPI: Bethany Lee is a 67 y.o. female with ESRD on peritoneal dialysis for about 1 year.  Past medical history significant for CAD, DMT2, CHF, HTN, HLD, hypothyroidism, Hx GIB and Hx steal syndrome requiring banding of AVF.   Bethany Lee has been admitted due to dialysis access dysfunction.  Patient originally presented to [facility].  She has a poorly functioning PD catheter and underwent a revision on 04/02/18.  Following surgery PD fluid was easily instilled but attempts at draining fluid were unsuccessful, even with the use of activase.  Hemodialysis was attempted using LU AVF, but cannulation was unsuccessful.  Vascular surgery attempted to place a femoral tunneled dialysis catheter but were unsuccessful due to an "inability to thread the wire."  Patient husband became frustrated and requested transfer to [facility] for new access.  This morning Dr. Donnetta Hutching placed a R IJ TDC.  Pertinent findings include CXR showing interstitial edema, Hgb 7.2, and CMP collected on admission was questioned for accuracy so stat renal function panel is pending.   Patient seen and examined at bedside with husband.  Last PD was 6 days ago prior to revision surgery.  Reports orthopnea, edema, abdominal fullness/discomfort, and weakness.  Denies SOB, chest pain, n/v/d, and change in appetite.    Past Medical History:  Diagnosis Date  . Anemia   . CHF (congestive heart failure) ([place])   . Chronic kidney disease    peritoneal dialysis  . Complication of anesthesia   . Coronary artery disease   . Diabetes mellitus without complication ([place])   . Dialysis patient [facility])    Peritoneal dialysis patient  . Dyspnea    doe  . Edema   . History of recent blood transfusion 01/2017  . Hypercholesterolemia   . Hypertension   .  Hypothyroidism   . Neuropathy   . PONV (postoperative nausea and vomiting)   . Shingles    October-November 2017  . Steal syndrome as complication of dialysis access ([place])   . Tremors of nervous system    intermittent when taking gabapentin   Past Surgical History:  Procedure Laterality Date  . A/V FISTULAGRAM Left 01/16/2017   Procedure: A/V Fistulagram;  Surgeon: Katha Cabal, MD;  Location: [place] [facility];  Service: Cardiovascular;  Laterality: Left;  . A/V SHUNT INTERVENTION N/A 01/16/2017   Procedure: A/V Shunt Intervention;  Surgeon: Katha Cabal, MD;  Location: [place] [facility];  Service: Cardiovascular;  Laterality: N/A;  . ABDOMINAL HYSTERECTOMY    . AV FISTULA PLACEMENT Left 11/30/2016   Procedure: ARTERIOVENOUS (AV) FISTULA CREATION ( EXPLORE FOR CREATION BRACHIOCEPHALIC);  Surgeon: Algernon Huxley, MD;  Location: [facility] ORS;  Service: Vascular;  Laterality: Left;  . CAPD INSERTION N/A 11/25/2015   Procedure: LAPAROSCOPIC INSERTION CONTINUOUS AMBULATORY PERITONEAL DIALYSIS  (CAPD) CATHETER;  Surgeon: Algernon Huxley, MD;  Location: [facility] ORS;  Service: Vascular;  Laterality: N/A;  . CAPD INSERTION N/A 01/06/2016   Procedure: LAPAROSCOPIC INSERTION CONTINUOUS AMBULATORY PERITONEAL DIALYSIS  (CAPD) CATHETER REVISION ;  Surgeon: Algernon Huxley, MD;  Location: [facility] ORS;  Service: Vascular;  Laterality: N/A;  . CAPD INSERTION N/A 10/11/2016   Procedure: LAPAROSCOPIC INSERTION CONTINUOUS AMBULATORY PERITONEAL DIALYSIS  (CAPD) CATHETER ( REVISION );  Surgeon: Algernon Huxley, MD;  Location: [facility] ORS;  Service: Vascular;  Laterality: N/A;  . CARDIAC CATHETERIZATION    .  CATARACT EXTRACTION W/PHACO Right 05/25/2016   Procedure: CATARACT EXTRACTION PHACO AND INTRAOCULAR LENS PLACEMENT (IOC);  Surgeon: Eulogio Bear, MD;  Location: [facility] ORS;  Service: Ophthalmology;  Laterality: Right;  Lot # [id number] H [place]: 00:5.2 AP%: 11.3 CDE: 7.21  . CATARACT EXTRACTION W/PHACO Left 12/06/2017    Procedure: CATARACT EXTRACTION PHACO AND INTRAOCULAR LENS PLACEMENT (IOC);  Surgeon: Eulogio Bear, MD;  Location: [facility] ORS;  Service: Ophthalmology;  Laterality: Left;  [place] 00.23.4 AP% 7.9 CDE 1.83 Fluid Pack Lot # [id number]  . COLONOSCOPY WITH PROPOFOL N/A 02/15/2017   Procedure: COLONOSCOPY WITH PROPOFOL;  Surgeon: [name], MD;  Location: [facility] ENDOSCOPY;  Service: Gastroenterology;  Laterality: N/A;  . CORONARY ANGIOPLASTY     STENT  . CORONARY STENT INTERVENTION N/A 09/07/2016   Procedure: Coronary Stent Intervention;  Surgeon: Isaias Cowman, MD;  Location: [place] [facility];  Service: Cardiovascular;  Laterality: N/A;  . DIALYSIS/PERMA CATHETER INSERTION N/A 09/21/2016   Procedure: Dialysis/Perma Catheter Insertion;  Surgeon: Algernon Huxley, MD;  Location: [place] [facility];  Service: Cardiovascular;  Laterality: N/A;  . DIALYSIS/PERMA CATHETER REMOVAL Right 11/30/2016   Procedure: DIALYSIS/PERMA CATHETER REMOVAL;  Surgeon: Algernon Huxley, MD;  Location: [facility] ORS;  Service: Vascular;  Laterality: Right;  . ESOPHAGOGASTRODUODENOSCOPY (EGD) WITH PROPOFOL N/A 02/15/2017   Procedure: ESOPHAGOGASTRODUODENOSCOPY (EGD) WITH PROPOFOL;  Surgeon: [name], MD;  Location: [facility] ENDOSCOPY;  Service: Gastroenterology;  Laterality: N/A;  . EYE SURGERY    . GIVENS CAPSULE STUDY N/A 02/26/2017   Procedure: GIVENS CAPSULE STUDY;  Surgeon: [name], MD;  Location: [facility] ENDOSCOPY;  Service: Gastroenterology;  Laterality: N/A;  . LEFT HEART CATH AND CORONARY ANGIOGRAPHY Left 09/07/2016   Procedure: Left Heart Cath and Coronary Angiography;  Surgeon: Isaias Cowman, MD;  Location: [place] [facility];  Service: Cardiovascular;  Laterality: Left;  . LIGATION OF ARTERIOVENOUS  FISTULA Left 02/07/2017   Procedure: LIGATION OF ARTERIOVENOUS  FISTULA ( BANDING BRACHIAL CEPHALIC );  Surgeon: Katha Cabal, MD;  Location: [facility] ORS;  Service: Vascular;  Laterality: Left;  . TUBAL LIGATION    .  UPPER EXTREMITY ANGIOGRAPHY Left 01/19/2017   Procedure: Upper Extremity Angiography;  Surgeon: Katha Cabal, MD;  Location: [place] [facility];  Service: Cardiovascular;  Laterality: Left;   Family History  Problem Relation Age of Onset  . Diabetes Mother    Social History:  reports that she quit smoking about 9 years ago. She has never used smokeless tobacco. She reports that she does not drink alcohol or use drugs. Allergies  Allergen Reactions  . Valacyclovir Nausea Only   Prior to Admission medications   Medication Sig Start Date End Date Taking? Authorizing Provider  amLODipine (NORVASC) 5 MG tablet Take 5 mg by mouth daily.   Yes [provider]  aspirin EC 81 MG tablet Take 1 tablet (81 mg total) by mouth daily. Start taking after NOV 15 Patient taking differently: Take 81 mg by mouth daily.  05/26/15  Yes Murlean Iba, MD  calcium acetate (PHOSLO) 667 MG capsule Take 2,001 mg by mouth 3 (three) times daily with meals.   Yes [provider]  carvedilol (COREG) 12.5 MG tablet Take 1 tablet (12.5 mg total) by mouth 2 (two) times daily with a meal. Patient taking differently: Take 25 mg by mouth 2 (two) times daily with a meal.  07/20/16  Yes Fritzi Mandes, MD  hydrALAZINE (APRESOLINE) 50 MG tablet Take 50 mg by mouth 2 (two) times daily.  Yes [provider]  HYDROcodone-acetaminophen (NORCO/VICODIN) 5-325 MG tablet Take 1 tablet by mouth every 4 (four) hours as needed for moderate pain (for 5 days).   Yes [provider]  insulin aspart (NOVOLOG FLEXPEN) 100 UNIT/ML FlexPen Inject 4-8 Units into the skin 3 (three) times daily with meals. Sliding scale  Below 200 take 4 units 200-250=6 units  >250= 8 units 09/13/15  Yes [provider]  Insulin Glargine (TOUJEO SOLOSTAR) 300 UNIT/ML SOPN Inject 20 Units into the skin at bedtime. Patient taking differently: Inject 24 Units into the skin at bedtime.  12/21/15  Yes Theodoro Grist, MD   iron polysaccharides (NIFEREX) 150 MG capsule Take 150 mg by mouth daily.   Yes [provider]  levothyroxine (SYNTHROID, LEVOTHROID) 112 MCG tablet Take 112 mcg by mouth daily before breakfast.    Yes [provider]  lovastatin (MEVACOR) 40 MG tablet Take 40 mg by mouth at bedtime.   Yes [provider]  Multiple Vitamins-Minerals (DIALYVITE 800/ULTRA D) TABS Take 1 tablet by mouth daily.   Yes [provider]  pantoprazole (PROTONIX) 40 MG tablet Take 1 tablet (40 mg total) by mouth 2 (two) times daily. Patient taking differently: Take 40 mg by mouth daily.  01/31/17  Yes Max Sane, MD  sevelamer carbonate (RENVELA) 800 MG tablet Take 1,600 mg by mouth 3 (three) times daily with meals.   Yes [provider]  urea (CARMOL) 20 % cream Apply 1 application topically daily as needed (for dry skin).   Yes [provider]  gentamicin cream (GARAMYCIN) 0.1 % Apply 1 application topically daily. Patient not taking: Reported on 12/03/2017 12/21/15   Theodoro Grist, MD   Current Facility-Administered Medications  Medication Dose Route Frequency Provider Last Rate Last Dose  . 0.9 %  sodium chloride infusion  250 mL Intravenous PRN Rosetta Posner, MD 10 mL/hr at 04/06/18 2121    . acetaminophen (TYLENOL) tablet 650 mg  650 mg Oral Q6H PRN Rosetta Posner, MD   650 mg at 04/06/18 2118   Or  . acetaminophen (TYLENOL) suppository 650 mg  650 mg Rectal Q6H PRN Early, Arvilla Meres, MD      . amLODipine (NORVASC) tablet 5 mg  5 mg Oral Daily Early, Arvilla Meres, MD      . aspirin EC tablet 81 mg  81 mg Oral Daily Early, Arvilla Meres, MD      . calcium acetate (PHOSLO) capsule 2,001 mg  2,001 mg Oral TID WC Early, Arvilla Meres, MD      . carvedilol (COREG) tablet 25 mg  25 mg Oral BID WC Early, Arvilla Meres, MD      . Chlorhexidine Gluconate Cloth 2 % PADS 6 each  6 each Topical Q0600 Morning Halberg, Ria Comment, [place]      . Chlorhexidine Gluconate Cloth 2 % PADS 6 each  6 each Topical Q0600  Mahlani Berninger, [place], [place]      . Darbepoetin Alfa (ARANESP) injection 60 mcg  60 mcg Intravenous Once in dialysis Gabrielly Mccrystal, PA      . dextrose 50 % solution           . heparin injection 5,000 Units  5,000 Units Subcutaneous Q8H Early, Arvilla Meres, MD   5,000 Units at 04/07/18 [phone]  . hydrALAZINE (APRESOLINE) tablet 50 mg  50 mg Oral BID Rosetta Posner, MD   50 mg at 04/07/18 0111  . insulin aspart (novoLOG) injection 0-9 Units  0-9 Units Subcutaneous Q4H  Rosetta Posner, MD   1 Units at 04/06/18 2126  . iron polysaccharides (NIFEREX) capsule 150 mg  150 mg Oral Daily Early, Arvilla Meres, MD      . levothyroxine (SYNTHROID, LEVOTHROID) tablet 112 mcg  112 mcg Oral QAC breakfast Early, Arvilla Meres, MD      . multivitamin (RENA-VIT) tablet 1 tablet  1 tablet Oral QHS Rosetta Posner, MD   1 tablet at 04/07/18 0110  . pantoprazole (PROTONIX) EC tablet 40 mg  40 mg Oral BID Rosetta Posner, MD   40 mg at 04/07/18 0110  . pravastatin (PRAVACHOL) tablet 40 mg  40 mg Oral q1800 Early, Arvilla Meres, MD      . sevelamer carbonate (RENVELA) tablet 1,600 mg  1,600 mg Oral TID WC Early, Arvilla Meres, MD      . sodium chloride flush (NS) 0.9 % injection 3 mL  3 mL Intravenous Q12H Rosetta Posner, MD   3 mL at 04/06/18 2128  . sodium chloride flush (NS) 0.9 % injection 3 mL  3 mL Intravenous PRN Early, Arvilla Meres, MD      . urea 10 % lotion 1 application  1 application Topical Daily PRN Early, Arvilla Meres, MD       Labs: Basic Metabolic Panel: Recent Labs  Lab 04/04/18 1341 04/05/18 0354 04/05/18 2037 04/06/18 1949 04/07/18 0728  NA 132* 132*  --   --  QUESTIONABLE RESULTS, RECOMMEND RECOLLECT TO VERIFY  K 4.5 4.7  --   --  QUESTIONABLE RESULTS, RECOMMEND RECOLLECT TO VERIFY  CL 95* 96*  --   --  QUESTIONABLE RESULTS, RECOMMEND RECOLLECT TO VERIFY  CO2 19* 19*  --   --  QUESTIONABLE RESULTS, RECOMMEND RECOLLECT TO VERIFY  GLUCOSE 163* 89  --   --  QUESTIONABLE RESULTS, RECOMMEND RECOLLECT TO VERIFY  BUN 85* 94*  --   --  QUESTIONABLE  RESULTS, RECOMMEND RECOLLECT TO VERIFY  CREATININE 16.85* 17.74*  --  20.34* QUESTIONABLE RESULTS, RECOMMEND RECOLLECT TO VERIFY  CALCIUM 7.0* 7.0*  --   --  QUESTIONABLE RESULTS, RECOMMEND RECOLLECT TO VERIFY  PHOS  --   --  9.0*  --   --    Liver Function Tests: Recent Labs  Lab 04/04/18 1341 04/05/18 0354 04/07/18 0728  AST 16 12* QUESTIONABLE RESULTS, RECOMMEND RECOLLECT TO VERIFY  ALT <5 <5 QUESTIONABLE RESULTS, RECOMMEND RECOLLECT TO VERIFY  ALKPHOS 64 63 QUESTIONABLE RESULTS, RECOMMEND RECOLLECT TO VERIFY  BILITOT 0.6 0.6 QUESTIONABLE RESULTS, RECOMMEND RECOLLECT TO VERIFY  PROT 5.9* 5.3* QUESTIONABLE RESULTS, RECOMMEND RECOLLECT TO VERIFY  ALBUMIN 3.2* 3.0* QUESTIONABLE RESULTS, RECOMMEND RECOLLECT TO VERIFY   CBC: Recent Labs  Lab 04/04/18 1341 04/07/18 0728  WBC 8.0 9.9  NEUTROABS 6.7*  --   HGB 9.9* 7.2*  HCT 28.9* 23.0*  MCV 98.3 106.5*  PLT 183 106*   CBG: Recent Labs  Lab 04/07/18 0015 04/07/18 0443 04/07/18 0723 04/07/18 0922 04/07/18 1156  GLUCAP 92 75 67* 121* 93   Studies/Results: Dg Chest 1 View  Result Date: 04/07/2018 CLINICAL DATA:  Encounter for central line placement EXAM: CHEST  1 VIEW COMPARISON:  02/11/2017 FINDINGS: Dialysis catheter on the right with tip at the lower right atrium. Cardiomegaly. Lower lung volumes with vascular congestion and crowding. Left subclavian stenting. IMPRESSION: 1. Dialysis catheter with tip at the right atrium. 2. Low volume chest with interstitial crowding and vascular congestion. Electronically Signed   By: Monte Fantasia M.D.   On: 04/07/2018 09:29  Dg Abd 1 View  Result Date: 04/06/2018 CLINICAL DATA:  Peritoneal dialysis catheter dysfunction. Abdominal distension. EXAM: ABDOMEN - 1 VIEW COMPARISON:  Supine view of the abdomen dated 04/05/2018. Plain film of the abdomen dated 09/20/2016. FINDINGS: The dialysis catheter is below the lower limits of today's exam. Visualized bowel gas pattern is nonobstructive.  No evidence of free intraperitoneal air seen. No radiopaque calculi. No acute or suspicious osseous finding. IMPRESSION: 1. No acute findings.  Nonobstructive bowel gas pattern. 2. Peritoneal dialysis catheter appreciated on yesterday's exam is located below the lower limits of today's exam. Electronically Signed   By: Franki Cabot M.D.   On: 04/06/2018 11:22   Dg Abd 1 View  Result Date: 04/05/2018 CLINICAL DATA:  Peritoneal dialysis catheter not working properly. EXAM: ABDOMEN - 1 VIEW COMPARISON:  09/20/2016 FINDINGS: Peritoneal dialysis catheter enters the left lower abdomen and is looped in the right iliac fossa region. I do not see a kink. Bowel gas pattern is unremarkable. No significant bone finding. IMPRESSION: No evidence of catheter kink. Peritoneal dialysis catheter looped in the right iliac fossa region. Electronically Signed   By: Nelson Chimes M.D.   On: 04/05/2018 16:35   Dg Fluoro Guide Cv Line-no Report  Result Date: 04/07/2018 Fluoroscopy was utilized by the requesting physician.  No radiographic interpretation.    ROS: All others negative except those listed in HPI.  Physical Exam: Vitals:   04/07/18 0925 04/07/18 0940 04/07/18 0955 04/07/18 1020  BP: (!) 152/57 (!) 141/56 (!) 145/57 138/60  Pulse: 68 67 66 68  Resp: 16 17 20 20   Temp:   (!) 97 F (36.1 C) (!) 97.2 F (36.2 C)  TempSrc:   Oral Oral  SpO2: 98% 98% 99% 98%  Height:         General: WDWN, NAD, pleasant female Head: NCAT, sclera not icteric, MMM Neck: Supple. No lymphadenopathy Lungs: Breath sounds diminished. +bibasilar crackles. No wheeze or rhonchi. Breathing is unlabored. Heart: RRR. No murmur, rubs or gallops.  Abdomen: soft, +distended, +tenderness, +BS, PD cath dressed in LLQ, various dressings from surgery earlier this week. Lower extremities:1-2+ edema b/l, no ischemic changes, or open wounds  Upper extremities: 1+pitting edema on L w/blister, trace edema on RUE Neuro: AAOx3. Moves all  extremities spontaneously. Psych:  Responds to questions appropriately with a normal affect. Dialysis Access: R IJ TDC, LU AVF +b/t, PD cath in LLQ  Dialysis Orders:  [facility] 5 x 2.25L, tidal 85%  Assessment/Plan: 1.  PD catheter dysfunction - PD catheter with poor drainage post revision on 9/10. Per her primary nephrologist, can be addressed as OP by her surgeon Dr. Raul Del.  2. Hemodialysis access dysfunction - unable to cannulate AVF, previous revisions by primary surgeons, including banding due to severe steal syndrome.  Right IJ TDC placed this AM by Dr. Donnetta Hutching.  Husband requesting 2nd opinion of LU AVF by vascular, will request consult tomorrow.    3.  ESRD -  Temporarily transitioning to HD due to PD cath dysfunction.  Labs elevated due to lack of HD over the last week. Last SCr 20.34, BUN 94, K 4.7, from 9/13, repeat stat labs today pending. Orders written for 2hr HD today and 2.5hrs tomorrow with low flows. 4.  Hypertension/volume  - BP elevated, expect improvement post HD.  CXR shows interstitial edema and volume overloaded on exam, plan for 2-3L net fluid removal today with additional 1-2L tomorrow.  5.  Anemia of CKD - Hgb 7.2.  Gets EPO weekly as OP.  Will give 69mcg aranesp w/HD today.  Follow trends. If drops <7 will transfuse.  6.  Secondary Hyperparathyroidism -  Last correct Ca 7.3, will use 3.5Ca bath with HD today.  Phos elevated. Continue binders.  7.  Nutrition - Alb 3.0. Renal diet with fluid restrictions. Renavite. Prostat.  8. DMT2 9. CAD 10. CHF 11. Hypothyroidism  Jen Mow, PA-C [facility] Pager: [phone] 04/07/2018, 12:11 PM

## 2018-04-07 NOTE — Progress Notes (Signed)
Blount,PA texted re: administration of V[place]. Med was ordered @ 1330 for a potassium of 6.2 (drawn @ 1134). Patient went to hemodialysis prior to med being given. Potassium corrected on hemodialysis according to "Dane". Medication bumped to after dialysis.Awaiting response.

## 2018-04-07 NOTE — Transfer of Care (Signed)
Immediate Anesthesia Transfer of Care Note  Patient: Bethany Lee  Procedure(s) Performed: INSERTION OF DIALYSIS CATHETER (N/A )  Patient Location: PACU  Anesthesia Type:MAC  Level of Consciousness: awake  Airway & Oxygen Therapy: Patient Spontanous Breathing  Post-op Assessment: Report given to RN and Post -op Vital signs reviewed and stable  Post vital signs: Reviewed and stable  Last Vitals:  Vitals Value Taken Time  BP 149/55 04/07/2018  9:10 AM  Temp    Pulse 69 04/07/2018  9:14 AM  Resp 14 04/07/2018  9:14 AM  SpO2 97 % 04/07/2018  9:14 AM  Vitals shown include unvalidated device data.  Last Pain:  Vitals:   04/06/18 2218  PainSc: 0-No pain         Complications: No apparent anesthesia complications

## 2018-04-07 NOTE — Procedures (Signed)
Patient was seen on dialysis and the procedure was supervised.  BFR 250  Via TDC BP is  151/80.   Patient appears to be tolerating treatment well.  Bethany Lee Tanna Furry 04/07/2018

## 2018-04-07 NOTE — H&P (Signed)
Patient name: Bethany Lee MRN: [id number] DOB: [birth year] Sex: female    HPI: Bethany Lee is a 67 y.o. female with a complicated access history.  She has peritoneal dialysis catheter which is poorly functioning.  She was admitted to [facility] and had attempted use of her left upper arm fistula.  This is been revised on multiple occasions by the surgeons at [facility].  This was unsuccessful.  She had attempted femoral catheter placed yesterday and this was unsuccessful.  She was transferred to [facility] for other access.  Current Facility-Administered Medications  Medication Dose Route Frequency Provider Last Rate Last Dose  . [MAR Hold] 0.9 %  sodium chloride infusion  250 mL Intravenous PRN Jani Gravel, MD 10 mL/hr at 04/06/18 2121 250 mL at 04/06/18 2121  . 0.9 % irrigation (POUR BTL)    PRN Rosetta Posner, MD   1,000 mL at 04/07/18 0723  . [MAR Hold] acetaminophen (TYLENOL) tablet 650 mg  650 mg Oral Q6H PRN Jani Gravel, MD   650 mg at 04/06/18 2118   Or  . [MAR Hold] acetaminophen (TYLENOL) suppository 650 mg  650 mg Rectal Q6H PRN Jani Gravel, MD      . Doug Sou Hold] amLODipine (NORVASC) tablet 5 mg  5 mg Oral Daily Jani Gravel, MD      . Doug Sou Hold] aspirin EC tablet 81 mg  81 mg Oral Daily Jani Gravel, MD      . Doug Sou Hold] calcium acetate (PHOSLO) capsule 2,001 mg  2,001 mg Oral TID WC Jani Gravel, MD      . Doug Sou Hold] carvedilol (COREG) tablet 25 mg  25 mg Oral BID WC Jani Gravel, MD      . dextrose 50 % solution           . heparin 6,000 Units in sodium chloride 0.9 % 500 mL irrigation    PRN Early, Arvilla Meres, MD   500 mL at 04/07/18 0723  . [MAR Hold] heparin injection 5,000 Units  5,000 Units Subcutaneous Q8H Jani Gravel, MD   5,000 Units at 04/07/18 [phone]  . [MAR Hold] hydrALAZINE (APRESOLINE) tablet 50 mg  50 mg Oral BID Jani Gravel, MD   50 mg at 04/07/18 0111  . [MAR Hold] insulin aspart (novoLOG) injection 0-9  Units  0-9 Units Subcutaneous Q4H Jani Gravel, MD   1 Units at 04/06/18 2126  . [MAR Hold] iron polysaccharides (NIFEREX) capsule 150 mg  150 mg Oral Daily Jani Gravel, MD      . Doug Sou Hold] levothyroxine (SYNTHROID, LEVOTHROID) tablet 112 mcg  112 mcg Oral QAC breakfast Jani Gravel, MD      . lidocaine-EPINEPHrine 0.5 %-1:200000 (with pres) injection    PRN Rosetta Posner, MD   50 mL at 04/07/18 0829  . [MAR Hold] multivitamin (RENA-VIT) tablet 1 tablet  1 tablet Oral QHS Jani Gravel, MD   1 tablet at 04/07/18 0110  . [MAR Hold] pantoprazole (PROTONIX) EC tablet 40 mg  40 mg Oral BID Jani Gravel, MD   40 mg at 04/07/18 0110  . [MAR Hold] pravastatin (PRAVACHOL) tablet 40 mg  40 mg Oral q1800 Jani Gravel, MD      . Doug Sou Hold] sevelamer carbonate (RENVELA) tablet 1,600 mg  1,600 mg Oral TID WC Jani Gravel, MD      . Doug Sou Hold] sodium chloride flush (NS) 0.9 % injection 3 mL  3 mL Intravenous Q12H Jani Gravel, MD   3 mL at 04/06/18 2128  . [MAR Hold] sodium chloride flush (NS) 0.9 % injection 3 mL  3 mL Intravenous PRN Jani Gravel, MD      . Doug Sou Hold] urea 10 % lotion 1 application  1 application Topical Daily PRN Jani Gravel, MD         PHYSICAL EXAM: Vitals:   04/06/18 2111  Height: 5' (1.524 m)    GENERAL: The patient is a well-nourished female, in no acute distress. The vital signs are documented above. Easily palpable left upper arm fistula but it does appear to run in a deep course.  MEDICAL ISSUES: Had a long discussion with the patient and her husband.  Have recommended tunneled IJ catheter.  Explained that this could be used very temporarily if her PD catheter works or could be used for more prolonged period of time.  She will continue to follow-up with her access surgeons at [facility] regarding her poorly functioning left upper arm AV fistula   Rosetta Posner, MD [name] Tel [phone] Pager [phone]

## 2018-04-08 ENCOUNTER — Encounter (HOSPITAL_COMMUNITY): Payer: Self-pay | Admitting: Vascular Surgery

## 2018-04-08 ENCOUNTER — Inpatient Hospital Stay (HOSPITAL_COMMUNITY): Payer: Medicare HMO

## 2018-04-08 DIAGNOSIS — M7989 Other specified soft tissue disorders: Secondary | ICD-10-CM

## 2018-04-08 DIAGNOSIS — Z452 Encounter for adjustment and management of vascular access device: Secondary | ICD-10-CM

## 2018-04-08 LAB — COMPREHENSIVE METABOLIC PANEL
AST: 14 U/L — AB (ref 15–41)
Albumin: 2.5 g/dL — ABNORMAL LOW (ref 3.5–5.0)
Alkaline Phosphatase: 64 U/L (ref 38–126)
Anion gap: 17 — ABNORMAL HIGH (ref 5–15)
BUN: 69 mg/dL — AB (ref 8–23)
CHLORIDE: 96 mmol/L — AB (ref 98–111)
CO2: 21 mmol/L — ABNORMAL LOW (ref 22–32)
CREATININE: 16.06 mg/dL — AB (ref 0.44–1.00)
Calcium: 7.9 mg/dL — ABNORMAL LOW (ref 8.9–10.3)
GFR calc Af Amer: 2 mL/min — ABNORMAL LOW (ref >60)
GFR calc non Af Amer: 2 mL/min — ABNORMAL LOW (ref >60)
Glucose, Bld: 87 mg/dL (ref 70–99)
POTASSIUM: 4.6 mmol/L (ref 3.5–5.1)
SODIUM: 134 mmol/L — AB (ref 135–145)
Total Bilirubin: 0.7 mg/dL (ref 0.3–1.2)
Total Protein: 5.2 g/dL — ABNORMAL LOW (ref 6.5–8.1)

## 2018-04-08 LAB — CBC
HEMATOCRIT: 24.1 % — AB (ref 36.0–46.0)
Hemoglobin: 7.6 g/dL — ABNORMAL LOW (ref 12.0–15.0)
MCH: 32.6 pg (ref 26.0–34.0)
MCHC: 31.5 g/dL (ref 30.0–36.0)
MCV: 103.4 fL — AB (ref 78.0–100.0)
PLATELETS: 111 10*3/uL — AB (ref 150–400)
RBC: 2.33 MIL/uL — ABNORMAL LOW (ref 3.87–5.11)
RDW: 16.5 % — ABNORMAL HIGH (ref 11.5–15.5)
WBC: 5.9 10*3/uL (ref 4.0–10.5)

## 2018-04-08 LAB — HEMOGLOBIN A1C
HEMOGLOBIN A1C: 6 % — AB (ref 4.8–5.6)
MEAN PLASMA GLUCOSE: 126 mg/dL

## 2018-04-08 LAB — GLUCOSE, CAPILLARY
GLUCOSE-CAPILLARY: 92 mg/dL (ref 70–99)
GLUCOSE-CAPILLARY: 131 mg/dL — AB (ref 70–99)
GLUCOSE-CAPILLARY: 135 mg/dL — AB (ref 70–99)
Glucose-Capillary: 83 mg/dL (ref 70–99)

## 2018-04-08 LAB — POCT I-STAT 4, (NA,K, GLUC, HGB,HCT)
GLUCOSE: 165 mg/dL — AB (ref 70–99)
HCT: 23 % — ABNORMAL LOW (ref 36.0–46.0)
HEMOGLOBIN: 7.8 g/dL — AB (ref 12.0–15.0)
POTASSIUM: 5.4 mmol/L — AB (ref 3.5–5.1)
Sodium: 125 mmol/L — ABNORMAL LOW (ref 135–145)

## 2018-04-08 MED ORDER — HEPARIN SODIUM (PORCINE) 1000 UNIT/ML DIALYSIS: 20.0000 [IU]/kg | INTRAMUSCULAR | Status: DC | PRN

## 2018-04-08 MED ORDER — LIDOCAINE HCL 1 % IJ SOLN
INTRAMUSCULAR | Status: AC
  Filled 2018-04-08: qty 20

## 2018-04-08 NOTE — Progress Notes (Signed)
Patient with ESRD on peritoneal dialysis for about 2 years who was admitted to [facility] for peritoneal catheter malfunction - right IJ tunneled HD was placed on 9/15 by CV surgery and patient successfully received hemodialysis yesterday here at [facility].   Request to IR today for US guided diagnostic and therapeutic paracentesis due to concern for abdominal distention. No recent abdominal imaging studies were obtained prior to today. Limited abdominal US performed in IR today showing no fluid which is amenable to drainage today. Discussed with patient risks of procedure today given no fluid visualized with [place], patient states understanding and all questions were answered. Patient was transferred back to floor.  Please call IR with questions or concerns.

## 2018-04-08 NOTE — Progress Notes (Signed)
*  Preliminary Results* Left upper extremity venous duplex completed. Left upper extremity is negative for deep and superficial vein thrombosis.  04/08/2018 10:09 AM  Maudry Mayhew, MHA, RVT, RDCS, RDMS

## 2018-04-08 NOTE — Progress Notes (Signed)
PROGRESS NOTE    Bethany Lee  JAS:[id number] DOB: July 14, 1951 DOA: 04/06/2018 PCP: Tracie Harrier, MD    Brief Narrative:  67 year old hx DM, hypertension, chf, cad, esrd on PD for 2 years [facility] [place] per family request after PD catheter malfunction. Evidently problem with AV fistula cannulation and insertion of femoral dialysis catheter. Patient was advised to have IJ placed to continue dialysis. 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 PD dialysis catheter dysfunction.  -Temp HD cath placed, pt to continue on HD per Nephrology -discussed with SW. Planning to arrange outpatient HD chair  2. ESRD PD.  -Noted to be volume overloaded -Nephrology following. Had been on PD, now on HD. Chair pending -Will need to f/u with Dr. Raul Del as outpt for access  3. DM.  -Glucose controlled -Continued on SSI coverage -Toujeo on hold  4. Anemia of chronic disease -. Follow CBC  5. CAD.  -denies chest pain. -Continue on beta blocker and ASA  6. Hypertension.  -Stable at present -Cont current regimen  7. Hypothyroidism  -Will continue on current regimen  8.. Abdominal distention/ metabolic acidosis/dyspnea.  -initial concerns for possible volume overload/ascites -Reviewed abd [place], minimal fluid present, not enough to tap -Would continue with HD as tolerated  DVT prophylaxis: Heparin subQ Code Status: Full Family Communication: Pt in room, family at bedside Disposition Plan: Uncertain at  this time  Consultants:   Nephrology  Procedures:     Antimicrobials: Anti-infectives (From admission, onward)   None       Subjective: Without complaints at this time  Objective: Vitals:   04/08/18 1307 04/08/18 1325 04/08/18 1400 04/08/18 1430  BP: 139/60 (!) 134/55 (!) 140/53 (!) 114/52  Pulse: 72 73 72 73  Resp: 16     Temp: 98.9 F (37.2 C)     TempSrc: Oral     SpO2: 95%     Weight: 76.5 kg     Height:        Intake/Output Summary (Last 24 hours) at 04/08/2018 1503 Last data filed at 04/08/2018 0600 Gross per 24 hour  Intake 120 ml  Output 2000 ml  Net -1880 ml   Filed Weights   04/07/18 1440 04/07/18 1656 04/08/18 1307  Weight: 77.2 kg 75.2 kg 76.5 kg    Examination:  General exam: Appears calm and comfortable  Respiratory system: Clear to auscultation. Respiratory effort normal. Cardiovascular system: S1 & S2 heard, RRR Gastrointestinal system:soft and nontender. No organomegaly or masses felt. Normal bowel sounds heard. Central nervous system: Alert and oriented. No focal neurological deficits. Extremities: Symmetric 5 x 5 power. Skin: No rashes, lesions Psychiatry: Judgement and insight appear normal. Mood & affect appropriate.   Data Reviewed: I have personally reviewed following labs and imaging studies  CBC: Recent Labs  Lab 04/04/18 1341 04/07/18 0728 04/07/18 0806 04/08/18 0546  WBC 8.0 9.9  --  5.9  NEUTROABS 6.7*  --   --   --   HGB 9.9* 7.2*  7.8* 7.6*  HCT 28.9* 23.0* 23.0* 24.1*  MCV 98.3 106.5*  --  103.4*  PLT 183 106*  --  710*   Basic Metabolic Panel: Recent Labs  Lab 04/04/18 1341 04/05/18 0354 04/05/18 2037 04/06/18 1949 04/07/18 0728 04/07/18 0806 04/07/18 1134 04/08/18 0546  NA 132* 132*  --   --  QUESTIONABLE RESULTS, RECOMMEND RECOLLECT TO VERIFY 125* 126* 134*  K 4.5 4.7  --   --  QUESTIONABLE RESULTS, RECOMMEND RECOLLECT TO VERIFY 5.4* 6.2* 4.6  CL 95* 96*  --   --  QUESTIONABLE RESULTS, RECOMMEND  RECOLLECT TO VERIFY  --  90* 96*  CO2 19* 19*  --   --  QUESTIONABLE RESULTS, RECOMMEND RECOLLECT TO VERIFY  --  13* 21*  GLUCOSE 163* 89  --   --  QUESTIONABLE RESULTS, RECOMMEND RECOLLECT TO VERIFY 165* 85 87  BUN 85* 94*  --   --  QUESTIONABLE RESULTS, RECOMMEND RECOLLECT TO VERIFY  --  114* 69*  CREATININE 16.85* 17.74*  --  20.34* QUESTIONABLE RESULTS, RECOMMEND RECOLLECT TO VERIFY  --  20.85* 16.06*  CALCIUM 7.0* 7.0*  --   --  QUESTIONABLE RESULTS, RECOMMEND RECOLLECT TO VERIFY  --  6.5* 7.9*  PHOS  --   --  9.0*  --   --   --  10.5*  --    GFR: Estimated Creatinine Clearance: 3.1 mL/min (A) (by C-G formula based on SCr of 16.06 mg/dL (H)). Liver Function Tests: Recent Labs  Lab 04/04/18 1341 04/05/18 0354 04/07/18 0728 04/07/18 1134 04/08/18 0546  AST 16 12* QUESTIONABLE RESULTS, RECOMMEND RECOLLECT TO VERIFY 20 14*  ALT <5 <5 QUESTIONABLE RESULTS, RECOMMEND RECOLLECT TO VERIFY <5 <5  ALKPHOS 64 63 QUESTIONABLE RESULTS, RECOMMEND RECOLLECT TO VERIFY 69 64  BILITOT 0.6 0.6 QUESTIONABLE RESULTS, RECOMMEND RECOLLECT TO VERIFY 0.7 0.7  PROT 5.9* 5.3* QUESTIONABLE RESULTS, RECOMMEND RECOLLECT TO VERIFY 5.1* 5.2*  ALBUMIN 3.2* 3.0* QUESTIONABLE RESULTS, RECOMMEND RECOLLECT TO VERIFY 2.8* 2.5*   No results for input(s): LIPASE, AMYLASE in the last 168 hours. No results for input(s): AMMONIA in the last 168 hours. Coagulation Profile: Recent Labs  Lab 04/07/18 0728  INR 1.15   Cardiac Enzymes: No results for input(s): CKTOTAL, CKMB, CKMBINDEX, TROPONINI in the last 168 hours. BNP (last 3 results) No results for input(s): PROBNP in the last 8760 hours. HbA1C: Recent Labs    04/07/18 1134  HGBA1C 6.0*   CBG: Recent Labs  Lab 04/07/18 1740 04/07/18 2016 04/07/18 2351 04/08/18 0459 04/08/18 0727  GLUCAP 93 106* 124* 92 83   Lipid Profile: No results for input(s): CHOL, HDL, LDLCALC, TRIG, CHOLHDL, LDLDIRECT in the last 72 hours. Thyroid Function Tests: No results  for input(s): TSH, T4TOTAL, FREET4, T3FREE, THYROIDAB in the last 72 hours. Anemia Panel: Recent Labs    04/07/18 1134  FERRITIN 632*  TIBC 196*  IRON 32   Sepsis Labs: No results for input(s): PROCALCITON, LATICACIDVEN in the last 168 hours.  Recent Results (from the past 240 hour(s))  MRSA PCR Screening     Status: None   Collection Time: 04/05/18  8:37 PM  Result Value Ref Range Status   MRSA by PCR NEGATIVE NEGATIVE Final    Comment:        The GeneXpert MRSA Assay (FDA approved for NASAL specimens only), is one component of a comprehensive MRSA colonization surveillance program. It is not intended to diagnose MRSA infection nor to guide or monitor treatment for MRSA infections. Performed at  [facility], [address], [place], [place] [address]      Radiology Studies: Dg Chest 1 View  Result Date: 04/07/2018 CLINICAL DATA:  Encounter for central line placement EXAM: CHEST  1 VIEW COMPARISON:  02/11/2017 FINDINGS: Dialysis catheter on the right with tip at the lower right atrium. Cardiomegaly. Lower lung volumes with vascular congestion and crowding. Left subclavian stenting. IMPRESSION: 1. Dialysis catheter with tip at the right atrium. 2. Low volume chest with interstitial crowding and vascular congestion. Electronically Signed   By: Monte Fantasia M.D.   On: 04/07/2018 09:29   Dg Fluoro Guide Cv Line-no Report  Result Date: 04/07/2018 Fluoroscopy was utilized by the requesting physician.  No radiographic interpretation.   Ir Abdomen US Limited  Result Date: 04/08/2018 CLINICAL DATA:  History of peritoneal dialysis with abdominal distention. Patient currently has a hemodialysis catheter. Evaluate for ascites and diagnostic paracentesis. EXAM: LIMITED ABDOMEN ULTRASOUND FOR ASCITES TECHNIQUE: Limited ultrasound survey for ascites was performed in all four abdominal quadrants. COMPARISON:  Abdominal CT 05/04/2016 FINDINGS: There is no significant ascites  identified in the abdomen. IMPRESSION: No significant ascites.  Paracentesis not performed. Electronically Signed   By: Markus Daft M.D.   On: 04/08/2018 11:17    Scheduled Meds: . amLODipine  5 mg Oral Daily  . aspirin EC  81 mg Oral Daily  . calcium acetate  2,001 mg Oral TID WC  . carvedilol  25 mg Oral BID WC  . Chlorhexidine Gluconate Cloth  6 each Topical Q0600  . Chlorhexidine Gluconate Cloth  6 each Topical Q0600  . heparin  5,000 Units Subcutaneous Q8H  . hydrALAZINE  50 mg Oral BID  . insulin aspart  0-9 Units Subcutaneous Q4H  . iron polysaccharides  150 mg Oral Daily  . levothyroxine  112 mcg Oral QAC breakfast  . multivitamin  1 tablet Oral QHS  . pantoprazole  40 mg Oral BID  . pravastatin  40 mg Oral q1800  . sevelamer carbonate  1,600 mg Oral TID WC  . sodium chloride flush  3 mL Intravenous Q12H   Continuous Infusions: . sodium chloride 250 mL (04/06/18 2121)     LOS: 1 day   Marylu Lund, MD [facility] Pager On Amion  If 7PM-7AM, please contact night-coverage 04/08/2018, 3:03 PM

## 2018-04-08 NOTE — Progress Notes (Signed)
[facility] Progress Note    Subjective:   No new complaints   Objective:   BP (!) 138/49 (BP Location: Right Arm)   Pulse 73   Temp 98.1 F (36.7 C)   Resp 18   Ht 5' (1.524 m)   Wt 75.2 kg   SpO2 99%   BMI 32.38 kg/m   Intake/Output: I/O last 3 completed shifts: In: 63 [P.O.:420; I.V.:110] Out: 2426 [Urine:400; Other:2000; Stool:1; Blood:25]   Intake/Output this shift:  No intake/output data recorded. Weight change:   Physical Exam: Gen: WD WN Asian female in NAD CVS: no rub Resp: cta Abd: distended, +BS, soft, NT Ext: 1+ edema, LUE AVF +T/B  Labs: BMET Recent Labs  Lab 04/04/18 1341 04/05/18 0354 04/05/18 2037 04/06/18 1949 04/07/18 0728 04/07/18 0806 04/07/18 1134 04/08/18 0546  NA 132* 132*  --   --  QUESTIONABLE RESULTS, RECOMMEND RECOLLECT TO VERIFY 125* 126* 134*  K 4.5 4.7  --   --  QUESTIONABLE RESULTS, RECOMMEND RECOLLECT TO VERIFY 5.4* 6.2* 4.6  CL 95* 96*  --   --  QUESTIONABLE RESULTS, RECOMMEND RECOLLECT TO VERIFY  --  90* 96*  CO2 19* 19*  --   --  QUESTIONABLE RESULTS, RECOMMEND RECOLLECT TO VERIFY  --  13* 21*  GLUCOSE 163* 89  --   --  QUESTIONABLE RESULTS, RECOMMEND RECOLLECT TO VERIFY 165* 85 87  BUN 85* 94*  --   --  QUESTIONABLE RESULTS, RECOMMEND RECOLLECT TO VERIFY  --  114* 69*  CREATININE 16.85* 17.74*  --  20.34* QUESTIONABLE RESULTS, RECOMMEND RECOLLECT TO VERIFY  --  20.85* 16.06*  ALBUMIN 3.2* 3.0*  --   --  QUESTIONABLE RESULTS, RECOMMEND RECOLLECT TO VERIFY  --  2.8* 2.5*  CALCIUM 7.0* 7.0*  --   --  QUESTIONABLE RESULTS, RECOMMEND RECOLLECT TO VERIFY  --  6.5* 7.9*  PHOS  --   --  9.0*  --   --   --  10.5*  --    CBC Recent Labs  Lab 04/04/18 1341 04/07/18 0728 04/07/18 0806 04/08/18 0546  WBC 8.0 9.9  --  5.9  NEUTROABS 6.7*  --   --   --   HGB 9.9* 7.2* 7.8* 7.6*  HCT 28.9* 23.0* 23.0* 24.1*  MCV 98.3 106.5*  --  103.4*  PLT 183 106*  --  111*    @IMGRELPRIORS @ Medications:    .  amLODipine  5 mg Oral Daily  . aspirin EC  81 mg Oral Daily  . calcium acetate  2,001 mg Oral TID WC  . carvedilol  25 mg Oral BID WC  . Chlorhexidine Gluconate Cloth  6 each Topical Q0600  . Chlorhexidine Gluconate Cloth  6 each Topical Q0600  . heparin  5,000 Units Subcutaneous Q8H  . hydrALAZINE  50 mg Oral BID  . insulin aspart  0-9 Units Subcutaneous Q4H  . iron polysaccharides  150 mg Oral Daily  . levothyroxine  112 mcg Oral QAC breakfast  . multivitamin  1 tablet Oral QHS  . pantoprazole  40 mg Oral BID  . pravastatin  40 mg Oral q1800  . sevelamer carbonate  1,600 mg Oral TID WC  . sodium chloride flush  3 mL Intravenous Q12H   CCPD patient of Dr. Yvetta Coder with malfunctioning PD catheter (prescription was CCPD 5 exchanges 2.25 liter fills, tidal 85%)  Assessment/ Plan:   1. Vascular access/Dialysis access- s/p RIJ tdc placed by [facility], however PD catheter does not function  and will need to f/u with Dr. Raul Del as an outpatient. 2. ESRD will plan for HD again today and will need to arrange for outpatient HD with [facility] in [place] prior to discharge 3. Anemia: Hgb dropping.  Transfuse prn and will cont with ESA and follow 4. CKD-MBD: phos poorly controlled.  Cont with binders and renal diet 5. Nutrition: protein malnutrition- possalby related to uremia, however she may not be PD candidate if albumin does not improve 6. Hypertension/volume: still overloaded and will continue with UF on HD today.  May need another session tomorrow. 7. Disposition- need to arrange for outpatient HD with [facility] in [place].   Donetta Potts, MD [place] Pager [phone] 04/08/2018, 11:26 AM

## 2018-04-09 DIAGNOSIS — E872 Acidosis: Secondary | ICD-10-CM

## 2018-04-09 LAB — GLUCOSE, CAPILLARY
GLUCOSE-CAPILLARY: 156 mg/dL — AB (ref 70–99)
GLUCOSE-CAPILLARY: 184 mg/dL — AB (ref 70–99)
GLUCOSE-CAPILLARY: 152 mg/dL — AB (ref 70–99)
Glucose-Capillary: 118 mg/dL — ABNORMAL HIGH (ref 70–99)
Glucose-Capillary: 139 mg/dL — ABNORMAL HIGH (ref 70–99)

## 2018-04-09 LAB — RENAL FUNCTION PANEL
ANION GAP: 18 — AB (ref 5–15)
Albumin: 2.7 g/dL — ABNORMAL LOW (ref 3.5–5.0)
BUN: 38 mg/dL — ABNORMAL HIGH (ref 8–23)
CALCIUM: 7 mg/dL — AB (ref 8.9–10.3)
CO2: 23 mmol/L (ref 22–32)
Chloride: 95 mmol/L — ABNORMAL LOW (ref 98–111)
Creatinine, Ser: 10.44 mg/dL — ABNORMAL HIGH (ref 0.44–1.00)
GFR calc non Af Amer: 3 mL/min — ABNORMAL LOW (ref >60)
GFR, EST AFRICAN AMERICAN: 4 mL/min — AB (ref >60)
Glucose, Bld: 106 mg/dL — ABNORMAL HIGH (ref 70–99)
PHOSPHORUS: 5.3 mg/dL — AB (ref 2.5–4.6)
Potassium: 3.9 mmol/L (ref 3.5–5.1)
Sodium: 136 mmol/L (ref 135–145)

## 2018-04-09 LAB — HEPATITIS B SURFACE ANTIGEN: HEP B S AG: NEGATIVE

## 2018-04-09 LAB — CBC
HCT: 25.1 % — ABNORMAL LOW (ref 36.0–46.0)
Hemoglobin: 8 g/dL — ABNORMAL LOW (ref 12.0–15.0)
MCH: 33.2 pg (ref 26.0–34.0)
MCHC: 31.9 g/dL (ref 30.0–36.0)
MCV: 104.1 fL — ABNORMAL HIGH (ref 78.0–100.0)
PLATELETS: 117 10*3/uL — AB (ref 150–400)
RBC: 2.41 MIL/uL — AB (ref 3.87–5.11)
RDW: 16.8 % — ABNORMAL HIGH (ref 11.5–15.5)
WBC: 6.3 10*3/uL (ref 4.0–10.5)

## 2018-04-09 LAB — HEPATITIS B CORE ANTIBODY, TOTAL: HEP B C TOTAL AB: POSITIVE — AB

## 2018-04-09 NOTE — Progress Notes (Signed)
Dr. Juleen China has notified me that Bethany Lee has a spt at [facility] on Thursday at 9:30 am She is stable for discharge from a renal standpoint and will f/u with Dr. Raul Del as an outpatient to address her poorly functioning PD catheter.

## 2018-04-09 NOTE — Clinical Social Work Note (Signed)
CSW faxed [facility] Placement Request form and clinicals to [facility]. CSW visited with patient and her husband to gather information for the paperwork and advised them that they will be informed when everything is in place.  Abid Bolla Givens, MSW, LCSW Licensed Clinical Social Worker [place] [phone]

## 2018-04-09 NOTE — Progress Notes (Signed)
PROGRESS NOTE    Bethany Lee  NAT:[id number] DOB: [birth year] DOA: 04/06/2018 PCP: Tracie Harrier, MD    Brief Narrative:  67 year old hx DM, hypertension, chf, cad, esrd on PD for 2 years [facility] [place] per family request after PD catheter malfunction. Evidently problem with AV fistula cannulation and insertion of femoral dialysis catheter. Patient was advised to have IJ placed to continue dialysis. [facility] requested [facility]  Assessment & Plan:   Principal Problem:   Peritoneal dialysis catheter dysfunction ([place]) Active Problems:   Anemia in chronic renal disease   Hyperkalemia   CAD (coronary artery disease)   CKD (chronic kidney disease) stage 5, GFR less than 15 ml/min (HCC)   DM type 2 with diabetic peripheral neuropathy (HCC)   Hypothyroidism (acquired)   Steal syndrome as complication of dialysis access (HCC)   Abdominal distention   Hyponatremia   Dyspnea   Acidosis  1. PD dialysis catheter dysfunction.  -Temp HD cath placed, pt to continue on HD per Nephrology -SW working on arranging outpatient HD chair  2. ESRD PD.  -Nephrology following. Had been on PD, now on HD. Chair pending -Will need to f/u with Dr. Raul Del as outpt for access  3. DM.  -Glucose controlled -Continued on SSI coverage -Toujeo on hold  4. Anemia of chronic disease -. Follow CBC  5. CAD.  -denies chest pain. -Continue on beta blocker and ASA  6. Hypertension.  -Stable at present -Cont current regimen  7. Hypothyroidism  -Will continue on current regimen  8.. Abdominal distention/ metabolic acidosis/dyspnea.  -initial concerns for possible volume overload/ascites -Reviewed abd [place], minimal fluid present, not enough to tap -Would continue with HD as tolerated  DVT prophylaxis: Heparin subQ Code Status: Full Family Communication: Pt in room, family at bedside Disposition Plan: Uncertain at this time  Consultants:    Nephrology  Procedures:     Antimicrobials: Anti-infectives (From admission, onward)   None      Subjective: No complaints at present  Objective: Vitals:   04/09/18 1030 04/09/18 1100 04/09/18 1102 04/09/18 1255  BP: (!) 107/47 (!) 88/50 (!) 120/50 (!) 147/46  Pulse: 68 70 74 88  Resp:   18 18  Temp:   98.2 F (36.8 C) 98.5 F (36.9 C)  TempSrc:   Oral Oral  SpO2:   97% 98%  Weight:   71.6 kg   Height:        Intake/Output Summary (Last 24 hours) at 04/09/2018 1713 Last data filed at 04/09/2018 1300 Gross per 24 hour  Intake 300 ml  Output 2200 ml  Net -1900 ml   Filed Weights   04/08/18 1558 04/09/18 0722 04/09/18 1102  Weight: 73.4 kg 73.8 kg 71.6 kg    Examination: General exam: Conversant, in no acute distress Respiratory system: normal chest rise, clear, no audible wheezing  Data Reviewed: I have personally reviewed following labs and imaging studies  CBC: Recent Labs  Lab 04/04/18 1341 04/07/18 0728 04/07/18 0806 04/08/18 0546 04/09/18 0745  WBC 8.0 9.9  --  5.9 6.3  NEUTROABS 6.7*  --   --   --   --   HGB 9.9* 7.2* 7.8* 7.6* 8.0*  HCT 28.9* 23.0* 23.0* 24.1* 25.1*  MCV 98.3 106.5*  --  103.4* 104.1*  PLT 183 106*  --  111* 427*   Basic Metabolic Panel: Recent Labs  Lab 04/05/18 0354 04/05/18 2037 04/06/18 1949 04/07/18 0728 04/07/18 0806 04/07/18 1134 04/08/18  4010 04/09/18 0745  NA 132*  --   --  QUESTIONABLE RESULTS, RECOMMEND RECOLLECT TO VERIFY 125* 126* 134* 136  K 4.7  --   --  QUESTIONABLE RESULTS, RECOMMEND RECOLLECT TO VERIFY 5.4* 6.2* 4.6 3.9  CL 96*  --   --  QUESTIONABLE RESULTS, RECOMMEND RECOLLECT TO VERIFY  --  90* 96* 95*  CO2 19*  --   --  QUESTIONABLE RESULTS, RECOMMEND RECOLLECT TO VERIFY  --  13* 21* 23  GLUCOSE 89  --   --  QUESTIONABLE RESULTS, RECOMMEND RECOLLECT TO VERIFY 165* 85 87 106*  BUN 94*  --   --  QUESTIONABLE RESULTS, RECOMMEND RECOLLECT TO VERIFY  --  114* 69* 38*  CREATININE 17.74*  --  20.34*  QUESTIONABLE RESULTS, RECOMMEND RECOLLECT TO VERIFY  --  20.85* 16.06* 10.44*  CALCIUM 7.0*  --   --  QUESTIONABLE RESULTS, RECOMMEND RECOLLECT TO VERIFY  --  6.5* 7.9* 7.0*  PHOS  --  9.0*  --   --   --  10.5*  --  5.3*   GFR: Estimated Creatinine Clearance: 4.6 mL/min (A) (by C-G formula based on SCr of 10.44 mg/dL (H)). Liver Function Tests: Recent Labs  Lab 04/04/18 1341 04/05/18 0354 04/07/18 0728 04/07/18 1134 04/08/18 0546 04/09/18 0745  AST 16 12* QUESTIONABLE RESULTS, RECOMMEND RECOLLECT TO VERIFY 20 14*  --   ALT <5 <5 QUESTIONABLE RESULTS, RECOMMEND RECOLLECT TO VERIFY <5 <5  --   ALKPHOS 64 63 QUESTIONABLE RESULTS, RECOMMEND RECOLLECT TO VERIFY 69 64  --   BILITOT 0.6 0.6 QUESTIONABLE RESULTS, RECOMMEND RECOLLECT TO VERIFY 0.7 0.7  --   PROT 5.9* 5.3* QUESTIONABLE RESULTS, RECOMMEND RECOLLECT TO VERIFY 5.1* 5.2*  --   ALBUMIN 3.2* 3.0* QUESTIONABLE RESULTS, RECOMMEND RECOLLECT TO VERIFY 2.8* 2.5* 2.7*   No results for input(s): LIPASE, AMYLASE in the last 168 hours. No results for input(s): AMMONIA in the last 168 hours. Coagulation Profile: Recent Labs  Lab 04/07/18 0728  INR 1.15   Cardiac Enzymes: No results for input(s): CKTOTAL, CKMB, CKMBINDEX, TROPONINI in the last 168 hours. BNP (last 3 results) No results for input(s): PROBNP in the last 8760 hours. HbA1C: Recent Labs    04/07/18 1134  HGBA1C 6.0*   CBG: Recent Labs  Lab 04/08/18 2111 04/09/18 0010 04/09/18 0435 04/09/18 1254 04/09/18 1650  GLUCAP 135* 139* 118* 156* 184*   Lipid Profile: No results for input(s): CHOL, HDL, LDLCALC, TRIG, CHOLHDL, LDLDIRECT in the last 72 hours. Thyroid Function Tests: No results for input(s): TSH, T4TOTAL, FREET4, T3FREE, THYROIDAB in the last 72 hours. Anemia Panel: Recent Labs    04/07/18 1134  FERRITIN 632*  TIBC 196*  IRON 32   Sepsis Labs: No results for input(s): PROCALCITON, LATICACIDVEN in the last 168 hours.  Recent Results (from the past  240 hour(s))  MRSA PCR Screening     Status: None   Collection Time: 04/05/18  8:37 PM  Result Value Ref Range Status   MRSA by PCR NEGATIVE NEGATIVE Final    Comment:        The GeneXpert MRSA Assay (FDA approved for NASAL specimens only), is one component of a comprehensive MRSA colonization surveillance program. It is not intended to diagnose MRSA infection nor to guide or monitor treatment for MRSA infections. Performed at [facility], [address] [place], [place] [address]      Radiology Studies: Ir Abdomen US Limited  Result Date: 04/08/2018 CLINICAL DATA:  History of  peritoneal dialysis with abdominal distention. Patient currently has a hemodialysis catheter. Evaluate for ascites and diagnostic paracentesis. EXAM: LIMITED ABDOMEN ULTRASOUND FOR ASCITES TECHNIQUE: Limited ultrasound survey for ascites was performed in all four abdominal quadrants. COMPARISON:  Abdominal CT 05/04/2016 FINDINGS: There is no significant ascites identified in the abdomen. IMPRESSION: No significant ascites.  Paracentesis not performed. Electronically Signed   By: Markus Daft M.D.   On: 04/08/2018 11:17    Scheduled Meds: . amLODipine  5 mg Oral Daily  . aspirin EC  81 mg Oral Daily  . calcium acetate  2,001 mg Oral TID WC  . carvedilol  25 mg Oral BID WC  . Chlorhexidine Gluconate Cloth  6 each Topical Q0600  . Chlorhexidine Gluconate Cloth  6 each Topical Q0600  . heparin  5,000 Units Subcutaneous Q8H  . hydrALAZINE  50 mg Oral BID  . insulin aspart  0-9 Units Subcutaneous Q4H  . iron polysaccharides  150 mg Oral Daily  . levothyroxine  112 mcg Oral QAC breakfast  . multivitamin  1 tablet Oral QHS  . pantoprazole  40 mg Oral BID  . pravastatin  40 mg Oral q1800  . sevelamer carbonate  1,600 mg Oral TID WC  . sodium chloride flush  3 mL Intravenous Q12H   Continuous Infusions: . sodium chloride 250 mL (04/06/18 2121)     LOS: 2 days   Marylu Lund, MD [facility] Pager On Amion  If 7PM-7AM, please contact night-coverage 04/09/2018, 5:13 PM

## 2018-04-09 NOTE — Procedures (Signed)
I was present at this dialysis session. I have reviewed the session itself and made appropriate changes.  She feels well and is without new complaints.   Vital signs in last 24 hours:  Temp:  [98.3 F (36.8 C)-99.5 F (37.5 C)] 98.4 F (36.9 C) (09/17 0722) Pulse Rate:  [67-79] 70 (09/17 0740) Resp:  [16-20] 18 (09/17 0722) BP: (93-166)/(45-71) 166/71 (09/17 0740) SpO2:  [90 %-100 %] 93 % (09/17 0435) Weight:  [73.4 kg-76.5 kg] 73.8 kg (09/17 0722) Weight change: -0.7 kg Filed Weights   04/08/18 1307 04/08/18 1558 04/09/18 0722  Weight: 76.5 kg 73.4 kg 73.8 kg    Recent Labs  Lab 04/09/18 0745  NA 136  K 3.9  CL 95*  CO2 23  GLUCOSE 106*  BUN 38*  CREATININE 10.44*  CALCIUM 7.0*  PHOS 5.3*    Recent Labs  Lab 04/04/18 1341 04/07/18 0728 04/07/18 0806 04/08/18 0546 04/09/18 0745  WBC 8.0 9.9  --  5.9 6.3  NEUTROABS 6.7*  --   --   --   --   HGB 9.9* 7.2* 7.8* 7.6* 8.0*  HCT 28.9* 23.0* 23.0* 24.1* 25.1*  MCV 98.3 106.5*  --  103.4* 104.1*  PLT 183 106*  --  111* 117*    Scheduled Meds: . amLODipine  5 mg Oral Daily  . aspirin EC  81 mg Oral Daily  . calcium acetate  2,001 mg Oral TID WC  . carvedilol  25 mg Oral BID WC  . Chlorhexidine Gluconate Cloth  6 each Topical Q0600  . Chlorhexidine Gluconate Cloth  6 each Topical Q0600  . heparin  5,000 Units Subcutaneous Q8H  . hydrALAZINE  50 mg Oral BID  . insulin aspart  0-9 Units Subcutaneous Q4H  . iron polysaccharides  150 mg Oral Daily  . levothyroxine  112 mcg Oral QAC breakfast  . multivitamin  1 tablet Oral QHS  . pantoprazole  40 mg Oral BID  . pravastatin  40 mg Oral q1800  . sevelamer carbonate  1,600 mg Oral TID WC  . sodium chloride flush  3 mL Intravenous Q12H   Continuous Infusions: . sodium chloride 250 mL (04/06/18 2121)   PRN Meds:.sodium chloride, acetaminophen **OR** acetaminophen, sodium chloride flush, urea    CCPD patient of Dr. Yvetta Coder with malfunctioning PD catheter (prescription  was CCPD 5 exchanges 2.25 liter fills, tidal 85%)  Assessment/ Plan:   1. Vascular access/Dialysis access- s/p RIJ tdc placed by [facility], however PD catheter does not function and will need to f/u with Dr. Raul Del as an outpatient. 2. ESRD will plan for HD again today and will need to arrange for outpatient HD with [facility] in [place] prior to discharge 3. Anemia: Hgb dropping.  Transfuse prn and will cont with ESA and follow 4. CKD-MBD: phos poorly controlled.  Cont with binders and renal diet 5. Nutrition: protein malnutrition- possalby related to uremia, however she may not be PD candidate if albumin does not improve 6. Hypertension/volume: still overloaded and will continue with UF on HD today.  May need another session tomorrow. 7. Disposition- need to arrange for outpatient HD with [facility] in [place].  I contacted Dr. Annamary Carolin office and awaiting arrangements for outpatient HD at [facility] (where she was before moving to home therapies). Hopeful discharge later today.    Donetta Potts,  MD 04/09/2018, 8:43 AM

## 2018-04-10 DIAGNOSIS — D631 Anemia in chronic kidney disease: Secondary | ICD-10-CM

## 2018-04-10 DIAGNOSIS — N189 Chronic kidney disease, unspecified: Secondary | ICD-10-CM

## 2018-04-10 DIAGNOSIS — R0603 Acute respiratory distress: Secondary | ICD-10-CM

## 2018-04-10 LAB — GLUCOSE, CAPILLARY
GLUCOSE-CAPILLARY: 98 mg/dL (ref 70–99)
Glucose-Capillary: 124 mg/dL — ABNORMAL HIGH (ref 70–99)
Glucose-Capillary: 93 mg/dL (ref 70–99)

## 2018-04-10 NOTE — Discharge Summary (Signed)
Physician Discharge Summary  Bethany Lee RDE:[id number] DOB: [birth year] DOA: 04/06/2018  PCP: Tracie Harrier, MD  Admit date: 04/06/2018 Discharge date: 04/10/2018  Admitted From: home Disposition: home Recommendations for Outpatient Follow-up:  1. Follow up with PCP in 1-2 weeks 2. Please obtain BMP/CBC in one week 3. Follow up with dr Raul Del 4. Follow up with dr early  Home Health:none Equipment/Devicesnone Discharge Condition:stable CODE STATUS full Diet recommendation: cardiac renal  Brief/Interim Summary:67 year old hx DM, hypertension, chf, cad, esrd on PD for 2 years transferred [facility] [place] per family request after PD catheter malfunction. Evidently problem with AV fistula cannulation and insertion of femoral dialysis catheter. Patient was advised to have IJ placed to continue dialysis. 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 PD dialysis catheter dysfunction.  -Temp HD cath placed, pt to continue on HD per Nephrology - outpatient HD chair arranged at [facility]  2. ESRD PD.  - SEEN BY Nephrology Had been on PD, now on HD.  - f/u with Dr. Raul Del as outpt for access  3. DM.  -Glucose controlled -ContinuE glargine and novolog  4. Anemia of chronic disease -. stable 5. CAD. . -Continue on beta blocker and ASA  6. Hypertension. -restarted coreg.norvasc stopped.  7. Hypothyroidism  -Will continue synthroid  8.. Abdominal distention/ metabolic acidosis/dyspnea.  -initial concerns for  possible volume overload/ascites -Reviewed abd [place], minimal fluid present continue HD  Discharge Instructions  Discharge Instructions    Call MD for:  difficulty breathing, headache or visual disturbances   Complete by:  As directed    Call MD for:  persistant dizziness or light-headedness   Complete by:  As directed    Call MD for:  persistant nausea and vomiting   Complete by:  As directed    Call MD for:  redness, tenderness, or signs of infection (pain, swelling, redness, odor or green/yellow discharge around incision site)   Complete by:  As directed    Call MD for:  severe uncontrolled pain   Complete by:  As directed    Diet - low sodium heart healthy   Complete by:  As directed    Increase activity slowly   Complete by:  As directed      Allergies as of 04/10/2018      Reactions   Valacyclovir Nausea Only      Medication List    STOP taking these medications   amLODipine 5 MG tablet Commonly known as:  NORVASC   gentamicin cream 0.1 % Commonly known as:  GARAMYCIN     TAKE these medications   aspirin EC 81 MG tablet Take 1 tablet (81 mg total) by mouth daily. Start taking after NOV 15 What changed:  additional instructions   calcium acetate 667 MG capsule Commonly known as:  PHOSLO Take 2,001 mg by mouth 3 (three) times daily with meals.   carvedilol 12.5 MG tablet Commonly known as:  COREG Take 1 tablet (12.5 mg total) by mouth 2 (two) times daily  with a meal. What changed:  how much to take   DIALYVITE 800/ULTRA D Tabs Take 1 tablet by mouth daily.   hydrALAZINE 50 MG tablet Commonly known as:  APRESOLINE Take 50 mg by mouth 2 (two) times daily.   HYDROcodone-acetaminophen 5-325 MG tablet Commonly known as:  NORCO/VICODIN Take 1 tablet by mouth every 4 (four) hours as needed for moderate pain (for 5 days).   Insulin Glargine 300 UNIT/ML Sopn Inject 20 Units into the skin at bedtime. What changed:  how much to take   iron polysaccharides 150  MG capsule Commonly known as:  NIFEREX Take 150 mg by mouth daily.   levothyroxine 112 MCG tablet Commonly known as:  SYNTHROID, LEVOTHROID Take 112 mcg by mouth daily before breakfast.   lovastatin 40 MG tablet Commonly known as:  MEVACOR Take 40 mg by mouth at bedtime.   NOVOLOG FLEXPEN 100 UNIT/ML FlexPen Generic drug:  insulin aspart Inject 4-8 Units into the skin 3 (three) times daily with meals. Sliding scale  Below 200 take 4 units 200-250=6 units  >250= 8 units   pantoprazole 40 MG tablet Commonly known as:  PROTONIX Take 1 tablet (40 mg total) by mouth 2 (two) times daily. What changed:  when to take this   RENVELA 800 MG tablet Generic drug:  sevelamer carbonate Take 1,600 mg by mouth 3 (three) times daily with meals.   urea 20 % cream Commonly known as:  CARMOL Apply 1 application topically daily as needed (for dry skin).      Follow-up Information    Tracie Harrier, MD Follow up.   Specialty:  Internal Medicine Contact information: [address] [place] [address] [phone]        Demetrius Revel, MD Follow up.   Specialty:  Surgery Contact information: [address] [place] [address] [phone]        Rosetta Posner, MD Follow up.   Specialties:  Vascular Surgery, Cardiology Contact information: [address] [place] [address] [phone]          Allergies  Allergen Reactions  . Valacyclovir Nausea Only    Consultations:  Nephrology vascular   Procedures/Studies: Dg Chest 1 View  Result Date: 04/07/2018 CLINICAL DATA:  Encounter for central line placement EXAM: CHEST  1 VIEW COMPARISON:  02/11/2017 FINDINGS: Dialysis catheter on the right with tip at the lower right atrium. Cardiomegaly. Lower lung volumes with vascular congestion and crowding. Left subclavian stenting. IMPRESSION: 1. Dialysis catheter with tip at the right atrium. 2. Low volume chest with interstitial  crowding and vascular congestion. Electronically Signed   By: Monte Fantasia M.D.   On: 04/07/2018 09:29   Dg Abd 1 View  Result Date: 04/06/2018 CLINICAL DATA:  Peritoneal dialysis catheter dysfunction. Abdominal distension. EXAM: ABDOMEN - 1 VIEW COMPARISON:  Supine view of the abdomen dated 04/05/2018. Plain film of the abdomen dated 09/20/2016. FINDINGS: The dialysis catheter is below the lower limits of today's exam. Visualized bowel gas pattern is nonobstructive. No evidence of free intraperitoneal air seen. No radiopaque calculi. No acute or suspicious osseous finding. IMPRESSION: 1. No acute findings.  Nonobstructive bowel gas pattern. 2. Peritoneal dialysis catheter appreciated on yesterday's exam is located below the lower limits of today's exam. Electronically Signed   By: Franki Cabot M.D.   On: 04/06/2018 11:22   Dg Abd 1 View  Result Date: 04/05/2018 CLINICAL DATA:  Peritoneal dialysis catheter not working properly. EXAM: ABDOMEN -  1 VIEW COMPARISON:  09/20/2016 FINDINGS: Peritoneal dialysis catheter enters the left lower abdomen and is looped in the right iliac fossa region. I do not see a kink. Bowel gas pattern is unremarkable. No significant bone finding. IMPRESSION: No evidence of catheter kink. Peritoneal dialysis catheter looped in the right iliac fossa region. Electronically Signed   By: Nelson Chimes M.D.   On: 04/05/2018 16:35   Dg Fluoro Guide Cv Line-no Report  Result Date: 04/07/2018 Fluoroscopy was utilized by the requesting physician.  No radiographic interpretation.   Ir Abdomen US Limited  Result Date: 04/08/2018 CLINICAL DATA:  History of peritoneal dialysis with abdominal distention. Patient currently has a hemodialysis catheter. Evaluate for ascites and diagnostic paracentesis. EXAM: LIMITED ABDOMEN ULTRASOUND FOR ASCITES TECHNIQUE: Limited ultrasound survey for ascites was performed in all four abdominal quadrants. COMPARISON:  Abdominal CT 05/04/2016 FINDINGS:  There is no significant ascites identified in the abdomen. IMPRESSION: No significant ascites.  Paracentesis not performed. Electronically Signed   By: Markus Daft M.D.   On: 04/08/2018 11:17    (Echo, Carotid, EGD, Colonoscopy, ERCP)    Subjective:   Discharge Exam: Vitals:   04/10/18 0435 04/10/18 0748  BP: (!) 141/59 (!) 136/102  Pulse: 69 66  Resp: 18 20  Temp: 98.9 F (37.2 C) 98.5 F (36.9 C)  SpO2: 97% 100%   Vitals:   04/09/18 1102 04/09/18 1255 04/10/18 0435 04/10/18 0748  BP: (!) 120/50 (!) 147/46 (!) 141/59 (!) 136/102  Pulse: 74 88 69 66  Resp: 18 18 18 20   Temp: 98.2 F (36.8 C) 98.5 F (36.9 C) 98.9 F (37.2 C) 98.5 F (36.9 C)  TempSrc: Oral Oral  Oral  SpO2: 97% 98% 97% 100%  Weight: 71.6 kg     Height:        General: Pt is alert, awake, not in acute distress Cardiovascular: RRR, S1/S2 +, no rubs, no gallops Respiratory: CTA bilaterally, no wheezing, no rhonchi Abdominal: Soft, NT, ND, bowel sounds + Extremities: no edema, no cyanosis    The results of significant diagnostics from this hospitalization (including imaging, microbiology, ancillary and laboratory) are listed below for reference.     Microbiology: Recent Results (from the past 240 hour(s))  MRSA PCR Screening     Status: None   Collection Time: 04/05/18  8:37 PM  Result Value Ref Range Status   MRSA by PCR NEGATIVE NEGATIVE Final    Comment:        The GeneXpert MRSA Assay (FDA approved for NASAL specimens only), is one component of a comprehensive MRSA colonization surveillance program. It is not intended to diagnose MRSA infection nor to guide or monitor treatment for MRSA infections. Performed at [facility], [address] [place], [place] [address]      Labs: BNP (last 3 results) No results for input(s): BNP in the last 8760 hours. Basic Metabolic Panel: Recent Labs  Lab 04/05/18 0354 04/05/18 2037 04/06/18 1949 04/07/18 0728 04/07/18 0806  04/07/18 1134 04/08/18 0546 04/09/18 0745  NA 132*  --   --  QUESTIONABLE RESULTS, RECOMMEND RECOLLECT TO VERIFY 125* 126* 134* 136  K 4.7  --   --  QUESTIONABLE RESULTS, RECOMMEND RECOLLECT TO VERIFY 5.4* 6.2* 4.6 3.9  CL 96*  --   --  QUESTIONABLE RESULTS, RECOMMEND RECOLLECT TO VERIFY  --  90* 96* 95*  CO2 19*  --   --  QUESTIONABLE RESULTS, RECOMMEND RECOLLECT TO VERIFY  --  13* 21* 23  GLUCOSE  89  --   --  QUESTIONABLE RESULTS, RECOMMEND RECOLLECT TO VERIFY 165* 85 87 106*  BUN 94*  --   --  QUESTIONABLE RESULTS, RECOMMEND RECOLLECT TO VERIFY  --  114* 69* 38*  CREATININE 17.74*  --  20.34* QUESTIONABLE RESULTS, RECOMMEND RECOLLECT TO VERIFY  --  20.85* 16.06* 10.44*  CALCIUM 7.0*  --   --  QUESTIONABLE RESULTS, RECOMMEND RECOLLECT TO VERIFY  --  6.5* 7.9* 7.0*  PHOS  --  9.0*  --   --   --  10.5*  --  5.3*   Liver Function Tests: Recent Labs  Lab 04/04/18 1341 04/05/18 0354 04/07/18 0728 04/07/18 1134 04/08/18 0546 04/09/18 0745  AST 16 12* QUESTIONABLE RESULTS, RECOMMEND RECOLLECT TO VERIFY 20 14*  --   ALT <5 <5 QUESTIONABLE RESULTS, RECOMMEND RECOLLECT TO VERIFY <5 <5  --   ALKPHOS 64 63 QUESTIONABLE RESULTS, RECOMMEND RECOLLECT TO VERIFY 69 64  --   BILITOT 0.6 0.6 QUESTIONABLE RESULTS, RECOMMEND RECOLLECT TO VERIFY 0.7 0.7  --   PROT 5.9* 5.3* QUESTIONABLE RESULTS, RECOMMEND RECOLLECT TO VERIFY 5.1* 5.2*  --   ALBUMIN 3.2* 3.0* QUESTIONABLE RESULTS, RECOMMEND RECOLLECT TO VERIFY 2.8* 2.5* 2.7*   No results for input(s): LIPASE, AMYLASE in the last 168 hours. No results for input(s): AMMONIA in the last 168 hours. CBC: Recent Labs  Lab 04/04/18 1341 04/07/18 0728 04/07/18 0806 04/08/18 0546 04/09/18 0745  WBC 8.0 9.9  --  5.9 6.3  NEUTROABS 6.7*  --   --   --   --   HGB 9.9* 7.2* 7.8* 7.6* 8.0*  HCT 28.9* 23.0* 23.0* 24.1* 25.1*  MCV 98.3 106.5*  --  103.4* 104.1*  PLT 183 106*  --  111* 117*   Cardiac Enzymes: No results for input(s): CKTOTAL, CKMB,  CKMBINDEX, TROPONINI in the last 168 hours. BNP: Invalid input(s): POCBNP CBG: Recent Labs  Lab 04/09/18 1650 04/09/18 2027 04/10/18 0012 04/10/18 0434 04/10/18 0746  GLUCAP 184* 152* 98 124* 93   D-Dimer No results for input(s): DDIMER in the last 72 hours. Hgb A1c Recent Labs    04/07/18 1134  HGBA1C 6.0*   Lipid Profile No results for input(s): CHOL, HDL, LDLCALC, TRIG, CHOLHDL, LDLDIRECT in the last 72 hours. Thyroid function studies No results for input(s): TSH, T4TOTAL, T3FREE, THYROIDAB in the last 72 hours.  Invalid input(s): FREET3 Anemia work up Recent Labs    04/07/18 1134  FERRITIN 632*  TIBC 196*  IRON 32   Urinalysis    Component Value Date/Time   COLORURINE YELLOW (A) 04/06/2018 0700   APPEARANCEUR TURBID (A) 04/06/2018 0700   LABSPEC 1.010 04/06/2018 0700   PHURINE 5.0 04/06/2018 0700   GLUCOSEU 50 (A) 04/06/2018 0700   HGBUR MODERATE (A) 04/06/2018 0700   BILIRUBINUR NEGATIVE 04/06/2018 0700   KETONESUR NEGATIVE 04/06/2018 0700   PROTEINUR 100 (A) 04/06/2018 0700   NITRITE NEGATIVE 04/06/2018 0700   LEUKOCYTESUR LARGE (A) 04/06/2018 0700   Sepsis Labs Invalid input(s): PROCALCITONIN,  WBC,  LACTICIDVEN Microbiology Recent Results (from the past 240 hour(s))  MRSA PCR Screening     Status: None   Collection Time: 04/05/18  8:37 PM  Result Value Ref Range Status   MRSA by PCR NEGATIVE NEGATIVE Final    Comment:        The GeneXpert MRSA Assay (FDA approved for NASAL specimens only), is one component of a comprehensive MRSA colonization surveillance program. It is not intended to diagnose MRSA infection nor  to guide or monitor treatment for MRSA infections. Performed at [facility], [address], [place],  [address]      Time coordinating discharge: 35 minutes  SIGNED:   Georgette Shell, MD  [facility] 04/10/2018, 8:59 AM Pager   If 7PM-7AM, please contact  night-coverage [email or web address] Password [email or web address]

## 2018-04-10 NOTE — Progress Notes (Signed)
S: Feels much better O:BP (!) 136/102 (BP Location: Right Arm)   Pulse 66   Temp 98.5 F (36.9 C) (Oral)   Resp 20   Ht 5' (1.524 m)   Wt 71.6 kg   SpO2 100%   BMI 30.83 kg/m   Intake/Output Summary (Last 24 hours) at 04/10/2018 1154 Last data filed at 04/10/2018 0930 Gross per 24 hour  Intake 460 ml  Output 0 ml  Net 460 ml   Intake/Output: I/O last 3 completed shifts: In: 300 [P.O.:300] Out: 2200 [Other:2200]  Intake/Output this shift:  Total I/O In: 220 [P.O.:220] Out: -  Weight change: -2.7 kg Gen: NAD CVS: no rub Resp: cta Abd: distended, +BS, soft, NT Ext: no edema, LUE AVF +T/B with ecchymosis around antecubital area.  Recent Labs  Lab 04/04/18 1341 04/05/18 0354 04/05/18 2037 04/06/18 1949 04/07/18 0806 04/07/18 1134 04/08/18 0546 04/09/18 0745  NA 132* 132*  --   --  125* 126* 134* 136  K 4.5 4.7  --   --  5.4* 6.2* 4.6 3.9  CL 95* 96*  --   --   --  90* 96* 95*  CO2 19* 19*  --   --   --  13* 21* 23  GLUCOSE 163* 89  --   --  165* 85 87 106*  BUN 85* 94*  --   --   --  114* 69* 38*  CREATININE 16.85* 17.74*  --  20.34*  --  20.85* 16.06* 10.44*  ALBUMIN 3.2* 3.0*  --   --   --  2.8* 2.5* 2.7*  CALCIUM 7.0* 7.0*  --   --   --  6.5* 7.9* 7.0*  PHOS  --   --  9.0*  --   --  10.5*  --  5.3*  AST 16 12*  --   --   --  20 14*  --   ALT <5 <5  --   --   --  <5 <5  --    Liver Function Tests: Recent Labs  Lab 04/07/18 1134 04/08/18 0546 04/09/18 0745  AST 20 14*  --   ALT <5 <5  --   ALKPHOS 69 64  --   BILITOT 0.7 0.7  --   PROT 5.1* 5.2*  --   ALBUMIN 2.8* 2.5* 2.7*   No results for input(s): LIPASE, AMYLASE in the last 168 hours. No results for input(s): AMMONIA in the last 168 hours. CBC: Recent Labs  Lab 04/04/18 1341 04/07/18 0728 04/07/18 0806 04/08/18 0546 04/09/18 0745  WBC 8.0 9.9  --  5.9 6.3  NEUTROABS 6.7*  --   --   --   --   HGB 9.9* 7.2* 7.8* 7.6* 8.0*  HCT 28.9* 23.0* 23.0* 24.1* 25.1*  MCV 98.3 106.5*  --  103.4*  104.1*  PLT 183 106*  --  111* 117*   Cardiac Enzymes: No results for input(s): CKTOTAL, CKMB, CKMBINDEX, TROPONINI in the last 168 hours. CBG: Recent Labs  Lab 04/09/18 1650 04/09/18 2027 04/10/18 0012 04/10/18 0434 04/10/18 0746  GLUCAP 184* 152* 98 124* 93    Iron Studies: No results for input(s): IRON, TIBC, TRANSFERRIN, FERRITIN in the last 72 hours. Studies/Results: No results found. Marland Kitchen amLODipine  5 mg Oral Daily  . aspirin EC  81 mg Oral Daily  . calcium acetate  2,001 mg Oral TID WC  . carvedilol  25 mg Oral BID WC  . Chlorhexidine Gluconate Cloth  6 each  Topical Q0600  . Chlorhexidine Gluconate Cloth  6 each Topical Q0600  . heparin  5,000 Units Subcutaneous Q8H  . hydrALAZINE  50 mg Oral BID  . insulin aspart  0-9 Units Subcutaneous Q4H  . iron polysaccharides  150 mg Oral Daily  . levothyroxine  112 mcg Oral QAC breakfast  . multivitamin  1 tablet Oral QHS  . pantoprazole  40 mg Oral BID  . pravastatin  40 mg Oral q1800  . sevelamer carbonate  1,600 mg Oral TID WC  . sodium chloride flush  3 mL Intravenous Q12H    BMET    Component Value Date/Time   NA 136 04/09/2018 0745   K 3.9 04/09/2018 0745   K 4.8 01/06/2016 1416   CL 95 (L) 04/09/2018 0745   CO2 23 04/09/2018 0745   GLUCOSE 106 (H) 04/09/2018 0745   BUN 38 (H) 04/09/2018 0745   CREATININE 10.44 (H) 04/09/2018 0745   CALCIUM 7.0 (L) 04/09/2018 0745   GFRNONAA 3 (L) 04/09/2018 0745   GFRAA 4 (L) 04/09/2018 0745   CBC    Component Value Date/Time   WBC 6.3 04/09/2018 0745   RBC 2.41 (L) 04/09/2018 0745   HGB 8.0 (L) 04/09/2018 0745   HCT 25.1 (L) 04/09/2018 0745   PLT 117 (L) 04/09/2018 0745   MCV 104.1 (H) 04/09/2018 0745   MCH 33.2 04/09/2018 0745   MCHC 31.9 04/09/2018 0745   RDW 16.8 (H) 04/09/2018 0745   LYMPHSABS 0.9 (L) 04/04/2018 1341   MONOABS 0.4 04/04/2018 1341   EOSABS 0.1 04/04/2018 1341   BASOSABS 0.0 04/04/2018 1341    [facility] patient of Dr. Juleen China with malfunctioning  PD catheter (prescription was CCPD 5 exchanges 2.25 liter fills, tidal 85%)  Assessment/ Plan:  1. Vascular access/Dialysis access- s/p RIJ tdc placed by [facility], however PD catheter does not function and will need to f/u with Dr. Raul Del as an outpatient. 2. ESRD-Dr. Juleen China has notified me that Mrs. Nau has a spot at [facility] on Thursday at 9:30 am.  She is stable for discharge from a renal standpoint and will f/u with Dr. Raul Del as an outpatient to address her poorly functioning PD catheter. 3. Anemia:Hgb dropping. Transfuse prn and will cont with ESA and follow 4. CKD-MBD:phos poorly controlled. Cont with binders and renal diet 5. Nutrition:protein malnutrition- possalby related to uremia, however she may not be PD candidate if albumin does not improve 6. Hypertension/volume:still overloaded and will continue with UF on HD today. May need another session tomorrow. 7. Vascular access- poorly functioning LUE AVF- to follow up with Dr. Donnetta Hutching as an outpatient for intervention. 8. Disposition- stable for discharge as above.     Donetta Potts, MD [employer] [phone]

## 2018-04-10 NOTE — Progress Notes (Signed)
Patient educated about her new tunnel catheter, like do not open the transparent dressing by herself and no direct shower overhead as long as that hd catheter is with her,she could do direct shower below breast level only and sponge bath from head to breast level.Advised patient and her husband to have blood pressure machine.Always check her blood pressure on the morning of her dialysis day,if her bp is less than less 100 SBP,don't take her blood pressure.Advised patient to check her blood pressure after HD ,if less than 100 sbp ,wait her blood pressure to creep up before taking her bp medicine after HD.Advised patient and her husband to take all her one time a day medicine after hemodialysis on her hemodialysis day.Reminded patient and her spouse to keep on their schedule dialysis and to their medical appointment.Emphasized also the 1.2 Liters fluid restriction.All questions were answered satisfactorily at the time of discharged.Discharged home with her spouse on stable condition.

## 2018-04-11 DIAGNOSIS — Z992 Dependence on renal dialysis: Secondary | ICD-10-CM | POA: Diagnosis not present

## 2018-04-11 DIAGNOSIS — N186 End stage renal disease: Secondary | ICD-10-CM | POA: Diagnosis not present

## 2018-04-12 DIAGNOSIS — Z01818 Encounter for other preprocedural examination: Secondary | ICD-10-CM

## 2018-04-12 DIAGNOSIS — N186 End stage renal disease: Secondary | ICD-10-CM

## 2018-04-12 DIAGNOSIS — Z7682 Awaiting organ transplant status: Principal | ICD-10-CM

## 2018-04-13 DIAGNOSIS — Z992 Dependence on renal dialysis: Secondary | ICD-10-CM | POA: Diagnosis not present

## 2018-04-13 DIAGNOSIS — N186 End stage renal disease: Secondary | ICD-10-CM | POA: Diagnosis not present

## 2018-04-15 DIAGNOSIS — Z992 Dependence on renal dialysis: Secondary | ICD-10-CM | POA: Diagnosis not present

## 2018-04-15 DIAGNOSIS — T85611A Breakdown (mechanical) of intraperitoneal dialysis catheter, initial encounter: Secondary | ICD-10-CM | POA: Diagnosis not present

## 2018-04-15 DIAGNOSIS — N186 End stage renal disease: Secondary | ICD-10-CM | POA: Diagnosis not present

## 2018-04-15 DIAGNOSIS — Z4902 Encounter for fitting and adjustment of peritoneal dialysis catheter: Secondary | ICD-10-CM | POA: Diagnosis not present

## 2018-04-16 DIAGNOSIS — Z992 Dependence on renal dialysis: Secondary | ICD-10-CM | POA: Diagnosis not present

## 2018-04-16 DIAGNOSIS — N186 End stage renal disease: Secondary | ICD-10-CM | POA: Diagnosis not present

## 2018-04-16 DIAGNOSIS — E113412 Type 2 diabetes mellitus with severe nonproliferative diabetic retinopathy with macular edema, left eye: Secondary | ICD-10-CM | POA: Diagnosis not present

## 2018-04-17 DIAGNOSIS — N186 End stage renal disease: Secondary | ICD-10-CM | POA: Diagnosis not present

## 2018-04-17 DIAGNOSIS — Z992 Dependence on renal dialysis: Secondary | ICD-10-CM | POA: Diagnosis not present

## 2018-04-18 ENCOUNTER — Encounter (INDEPENDENT_AMBULATORY_CARE_PROVIDER_SITE_OTHER): Payer: Self-pay

## 2018-04-18 DIAGNOSIS — T85611A Breakdown (mechanical) of intraperitoneal dialysis catheter, initial encounter: Secondary | ICD-10-CM | POA: Diagnosis not present

## 2018-04-18 DIAGNOSIS — Z794 Long term (current) use of insulin: Secondary | ICD-10-CM | POA: Diagnosis not present

## 2018-04-18 DIAGNOSIS — Z992 Dependence on renal dialysis: Secondary | ICD-10-CM | POA: Diagnosis not present

## 2018-04-18 DIAGNOSIS — Z4902 Encounter for fitting and adjustment of peritoneal dialysis catheter: Secondary | ICD-10-CM | POA: Diagnosis not present

## 2018-04-18 DIAGNOSIS — E1122 Type 2 diabetes mellitus with diabetic chronic kidney disease: Secondary | ICD-10-CM | POA: Diagnosis not present

## 2018-04-18 DIAGNOSIS — N186 End stage renal disease: Secondary | ICD-10-CM | POA: Diagnosis not present

## 2018-04-18 DIAGNOSIS — N185 Chronic kidney disease, stage 5: Secondary | ICD-10-CM | POA: Diagnosis not present

## 2018-04-18 DIAGNOSIS — T85691A Other mechanical complication of intraperitoneal dialysis catheter, initial encounter: Secondary | ICD-10-CM | POA: Diagnosis not present

## 2018-04-22 DIAGNOSIS — N186 End stage renal disease: Secondary | ICD-10-CM | POA: Diagnosis not present

## 2018-04-22 DIAGNOSIS — Z992 Dependence on renal dialysis: Secondary | ICD-10-CM | POA: Diagnosis not present

## 2018-04-23 DIAGNOSIS — T85898A Other specified complication of other internal prosthetic devices, implants and grafts, initial encounter: Secondary | ICD-10-CM | POA: Diagnosis not present

## 2018-04-23 DIAGNOSIS — Z992 Dependence on renal dialysis: Secondary | ICD-10-CM | POA: Diagnosis not present

## 2018-04-23 DIAGNOSIS — R509 Fever, unspecified: Secondary | ICD-10-CM | POA: Diagnosis not present

## 2018-04-23 DIAGNOSIS — R6883 Chills (without fever): Secondary | ICD-10-CM | POA: Diagnosis not present

## 2018-04-23 DIAGNOSIS — N186 End stage renal disease: Secondary | ICD-10-CM | POA: Diagnosis not present

## 2018-04-23 DIAGNOSIS — R7881 Bacteremia: Secondary | ICD-10-CM | POA: Diagnosis not present

## 2018-04-25 DIAGNOSIS — T85898A Other specified complication of other internal prosthetic devices, implants and grafts, initial encounter: Secondary | ICD-10-CM | POA: Diagnosis not present

## 2018-04-25 DIAGNOSIS — R509 Fever, unspecified: Secondary | ICD-10-CM | POA: Diagnosis not present

## 2018-04-25 DIAGNOSIS — N186 End stage renal disease: Secondary | ICD-10-CM | POA: Diagnosis not present

## 2018-04-25 DIAGNOSIS — R7881 Bacteremia: Secondary | ICD-10-CM | POA: Diagnosis not present

## 2018-04-25 DIAGNOSIS — Z992 Dependence on renal dialysis: Secondary | ICD-10-CM | POA: Diagnosis not present

## 2018-04-25 DIAGNOSIS — R6883 Chills (without fever): Secondary | ICD-10-CM | POA: Diagnosis not present

## 2018-04-27 DIAGNOSIS — Z992 Dependence on renal dialysis: Secondary | ICD-10-CM | POA: Diagnosis not present

## 2018-04-27 DIAGNOSIS — R7881 Bacteremia: Secondary | ICD-10-CM | POA: Diagnosis not present

## 2018-04-27 DIAGNOSIS — T85898A Other specified complication of other internal prosthetic devices, implants and grafts, initial encounter: Secondary | ICD-10-CM | POA: Diagnosis not present

## 2018-04-27 DIAGNOSIS — R509 Fever, unspecified: Secondary | ICD-10-CM | POA: Diagnosis not present

## 2018-04-27 DIAGNOSIS — N186 End stage renal disease: Secondary | ICD-10-CM | POA: Diagnosis not present

## 2018-04-27 DIAGNOSIS — R6883 Chills (without fever): Secondary | ICD-10-CM | POA: Diagnosis not present

## 2018-04-29 ENCOUNTER — Ambulatory Visit: Admit: 2018-04-29 | Discharge: 2018-05-07 | Disposition: A | Discharge status: Home / Self Care | Payer: MEDICARE

## 2018-04-29 ENCOUNTER — Encounter
Admit: 2018-04-29 | Discharge: 2018-05-07 | Disposition: A | Discharge status: Home / Self Care | Payer: MEDICARE | Attending: Anesthesiology

## 2018-04-29 DIAGNOSIS — Z794 Long term (current) use of insulin: Secondary | ICD-10-CM | POA: Diagnosis not present

## 2018-04-29 DIAGNOSIS — E039 Hypothyroidism, unspecified: Secondary | ICD-10-CM | POA: Diagnosis not present

## 2018-04-29 DIAGNOSIS — N200 Calculus of kidney: Secondary | ICD-10-CM | POA: Diagnosis not present

## 2018-04-29 DIAGNOSIS — I120 Hypertensive chronic kidney disease with stage 5 chronic kidney disease or end stage renal disease: Secondary | ICD-10-CM | POA: Diagnosis not present

## 2018-04-29 DIAGNOSIS — Z4902 Encounter for fitting and adjustment of peritoneal dialysis catheter: Secondary | ICD-10-CM | POA: Diagnosis not present

## 2018-04-29 DIAGNOSIS — I2510 Atherosclerotic heart disease of native coronary artery without angina pectoris: Secondary | ICD-10-CM | POA: Diagnosis not present

## 2018-04-29 DIAGNOSIS — K658 Other peritonitis: Secondary | ICD-10-CM | POA: Diagnosis not present

## 2018-04-29 DIAGNOSIS — T85691A Other mechanical complication of intraperitoneal dialysis catheter, initial encounter: Secondary | ICD-10-CM | POA: Diagnosis not present

## 2018-04-29 DIAGNOSIS — T8571XA Infection and inflammatory reaction due to peritoneal dialysis catheter, initial encounter: Secondary | ICD-10-CM | POA: Diagnosis not present

## 2018-04-29 DIAGNOSIS — Z992 Dependence on renal dialysis: Secondary | ICD-10-CM | POA: Diagnosis not present

## 2018-04-29 DIAGNOSIS — K659 Peritonitis, unspecified: Secondary | ICD-10-CM | POA: Diagnosis not present

## 2018-04-29 DIAGNOSIS — B9689 Other specified bacterial agents as the cause of diseases classified elsewhere: Secondary | ICD-10-CM | POA: Diagnosis not present

## 2018-04-29 DIAGNOSIS — A419 Sepsis, unspecified organism: Secondary | ICD-10-CM | POA: Diagnosis not present

## 2018-04-29 DIAGNOSIS — I517 Cardiomegaly: Secondary | ICD-10-CM | POA: Diagnosis not present

## 2018-04-29 DIAGNOSIS — D631 Anemia in chronic kidney disease: Secondary | ICD-10-CM | POA: Diagnosis not present

## 2018-04-29 DIAGNOSIS — E1122 Type 2 diabetes mellitus with diabetic chronic kidney disease: Secondary | ICD-10-CM | POA: Diagnosis not present

## 2018-04-29 DIAGNOSIS — N186 End stage renal disease: Secondary | ICD-10-CM | POA: Diagnosis not present

## 2018-04-29 DIAGNOSIS — R112 Nausea with vomiting, unspecified: Principal | ICD-10-CM

## 2018-04-30 ENCOUNTER — Other Ambulatory Visit (INDEPENDENT_AMBULATORY_CARE_PROVIDER_SITE_OTHER): Payer: Self-pay | Admitting: Nurse Practitioner

## 2018-04-30 MED ORDER — POLYETHYLENE GLYCOL 3350 17 G PO PACK
17.00 | PACK | ORAL | Status: DC
Start: ? — End: 2018-04-30

## 2018-04-30 MED ORDER — HEPARIN SODIUM (PORCINE) 5000 UNIT/ML IJ SOLN
5000.00 | INTRAMUSCULAR | Status: DC
Start: 2018-05-03 — End: 2018-04-30

## 2018-04-30 MED ORDER — AMLODIPINE BESYLATE 5 MG PO TABS
5.00 | ORAL_TABLET | ORAL | Status: DC
Start: 2018-05-04 — End: 2018-04-30

## 2018-04-30 MED ORDER — INSULIN GLARGINE 100 UNIT/ML ~~LOC~~ SOLN
24.00 | SUBCUTANEOUS | Status: DC
Start: 2018-05-03 — End: 2018-04-30

## 2018-04-30 MED ORDER — ACETAMINOPHEN 325 MG PO TABS
650.00 | ORAL_TABLET | ORAL | Status: DC
Start: ? — End: 2018-04-30

## 2018-04-30 MED ORDER — SEVELAMER CARBONATE 800 MG PO TABS
1600.00 | ORAL_TABLET | ORAL | Status: DC
Start: 2018-04-30 — End: 2018-04-30

## 2018-04-30 MED ORDER — GENERIC EXTERNAL MEDICATION
Status: DC
Start: ? — End: 2018-04-30

## 2018-04-30 MED ORDER — CARVEDILOL 25 MG PO TABS
25.00 | ORAL_TABLET | ORAL | Status: DC
Start: 2018-05-03 — End: 2018-04-30

## 2018-04-30 MED ORDER — LEVOTHYROXINE SODIUM 50 MCG PO TABS
50.00 | ORAL_TABLET | ORAL | Status: DC
Start: 2018-05-01 — End: 2018-04-30

## 2018-04-30 MED ORDER — INSULIN LISPRO 100 UNIT/ML ~~LOC~~ SOLN
0.00 | SUBCUTANEOUS | Status: DC
Start: 2018-05-03 — End: 2018-04-30

## 2018-04-30 MED ORDER — DEXTROSE 10 % IV SOLN
12.50 | INTRAVENOUS | Status: DC
Start: ? — End: 2018-04-30

## 2018-04-30 MED ORDER — ASPIRIN 81 MG PO CHEW
81.00 | CHEWABLE_TABLET | ORAL | Status: DC
Start: 2018-05-04 — End: 2018-04-30

## 2018-04-30 MED ORDER — HYDRALAZINE HCL 50 MG PO TABS
50.00 | ORAL_TABLET | ORAL | Status: DC
Start: 2018-05-03 — End: 2018-04-30

## 2018-04-30 MED ORDER — PRAVASTATIN SODIUM 20 MG PO TABS
20.00 | ORAL_TABLET | ORAL | Status: DC
Start: 2018-05-03 — End: 2018-04-30

## 2018-04-30 MED ORDER — CEFEPIME HCL 2 GM/100ML IV SOLN
2.00 | INTRAVENOUS | Status: DC
Start: 2018-05-01 — End: 2018-04-30

## 2018-04-30 MED ORDER — CALCIUM ACETATE (PHOS BINDER) 667 MG PO CAPS
2001.00 | ORAL_CAPSULE | ORAL | Status: DC
Start: 2018-04-30 — End: 2018-04-30

## 2018-05-01 DIAGNOSIS — R112 Nausea with vomiting, unspecified: Principal | ICD-10-CM

## 2018-05-03 MED ORDER — GENERIC EXTERNAL MEDICATION
1.00 | Status: DC
Start: 2018-05-03 — End: 2018-05-03

## 2018-05-03 MED ORDER — SODIUM BICARBONATE 650 MG PO TABS
650.00 | ORAL_TABLET | ORAL | Status: DC
Start: 2018-05-03 — End: 2018-05-03

## 2018-05-03 MED ORDER — INFLUENZA VAC SPLIT QUAD 0.5 ML IM SUSY
0.50 | PREFILLED_SYRINGE | INTRAMUSCULAR | Status: DC
Start: ? — End: 2018-05-03

## 2018-05-03 MED ORDER — PSYLLIUM 51.7 % PO PACK
1.00 | PACK | ORAL | Status: DC
Start: 2018-05-04 — End: 2018-05-03

## 2018-05-03 MED ORDER — TRAMADOL HCL 50 MG PO TABS
50.00 | ORAL_TABLET | ORAL | Status: DC
Start: ? — End: 2018-05-03

## 2018-05-03 MED ORDER — LEVOTHYROXINE SODIUM 112 MCG PO TABS
112.00 | ORAL_TABLET | ORAL | Status: DC
Start: 2018-05-04 — End: 2018-05-03

## 2018-05-03 MED ORDER — HEPARIN SODIUM (PORCINE) 1000 UNIT/ML IJ SOLN
2000.00 | INTRAMUSCULAR | Status: DC
Start: ? — End: 2018-05-03

## 2018-05-03 MED ORDER — GENERIC EXTERNAL MEDICATION
1.70 | Status: DC
Start: ? — End: 2018-05-03

## 2018-05-03 MED ORDER — EPOETIN ALFA-EPBX 10000 UNIT/ML IJ SOLN
10000.00 | INTRAMUSCULAR | Status: DC
Start: ? — End: 2018-05-03

## 2018-05-03 MED ORDER — SEVELAMER CARBONATE 800 MG PO TABS
800.00 | ORAL_TABLET | ORAL | Status: DC
Start: 2018-05-03 — End: 2018-05-03

## 2018-05-03 MED ORDER — LISINOPRIL 10 MG PO TABS
10.00 | ORAL_TABLET | ORAL | Status: DC
Start: 2018-05-03 — End: 2018-05-03

## 2018-05-03 MED ORDER — HEPARIN SODIUM (PORCINE) 1000 UNIT/ML IJ SOLN
1000.00 | INTRAMUSCULAR | Status: DC
Start: ? — End: 2018-05-03

## 2018-05-04 DIAGNOSIS — R112 Nausea with vomiting, unspecified: Principal | ICD-10-CM

## 2018-05-05 DIAGNOSIS — R112 Nausea with vomiting, unspecified: Principal | ICD-10-CM

## 2018-05-06 DIAGNOSIS — R112 Nausea with vomiting, unspecified: Principal | ICD-10-CM

## 2018-05-07 ENCOUNTER — Ambulatory Visit: Admit: 2018-05-07 | Payer: Medicare HMO | Admitting: Vascular Surgery

## 2018-05-07 DIAGNOSIS — R112 Nausea with vomiting, unspecified: Principal | ICD-10-CM

## 2018-05-07 SURGERY — A/V FISTULAGRAM
Anesthesia: Moderate Sedation | Laterality: Left

## 2018-05-07 MED ORDER — TRAMADOL 50 MG TABLET
ORAL_TABLET | Freq: Four times a day (QID) | ORAL | 0 refills | 0 days | Status: CP | PRN
Start: 2018-05-07 — End: 2018-05-12

## 2018-05-07 MED ORDER — CEFTAZIDIME 2 GRAM INTRAVENOUS SOLUTION
0 refills | 0 days
Start: 2018-05-07 — End: 2018-05-21

## 2018-05-07 MED ORDER — SEVELAMER CARBONATE 800 MG TABLET
ORAL_TABLET | Freq: Three times a day (TID) | ORAL | 11 refills | 0.00000 days | Status: CP
Start: 2018-05-07 — End: 2019-05-07

## 2018-05-09 DIAGNOSIS — Z992 Dependence on renal dialysis: Secondary | ICD-10-CM | POA: Diagnosis not present

## 2018-05-09 DIAGNOSIS — R6883 Chills (without fever): Secondary | ICD-10-CM | POA: Diagnosis not present

## 2018-05-09 DIAGNOSIS — N186 End stage renal disease: Secondary | ICD-10-CM | POA: Diagnosis not present

## 2018-05-09 DIAGNOSIS — R7881 Bacteremia: Secondary | ICD-10-CM | POA: Diagnosis not present

## 2018-05-09 DIAGNOSIS — T85898A Other specified complication of other internal prosthetic devices, implants and grafts, initial encounter: Secondary | ICD-10-CM | POA: Diagnosis not present

## 2018-05-09 DIAGNOSIS — R509 Fever, unspecified: Secondary | ICD-10-CM | POA: Diagnosis not present

## 2018-05-10 DIAGNOSIS — R509 Fever, unspecified: Secondary | ICD-10-CM | POA: Diagnosis not present

## 2018-05-10 DIAGNOSIS — T85898A Other specified complication of other internal prosthetic devices, implants and grafts, initial encounter: Secondary | ICD-10-CM | POA: Diagnosis not present

## 2018-05-10 DIAGNOSIS — R7881 Bacteremia: Secondary | ICD-10-CM | POA: Diagnosis not present

## 2018-05-10 DIAGNOSIS — N186 End stage renal disease: Secondary | ICD-10-CM | POA: Diagnosis not present

## 2018-05-10 DIAGNOSIS — R6883 Chills (without fever): Secondary | ICD-10-CM | POA: Diagnosis not present

## 2018-05-10 DIAGNOSIS — Z992 Dependence on renal dialysis: Secondary | ICD-10-CM | POA: Diagnosis not present

## 2018-05-13 DIAGNOSIS — R509 Fever, unspecified: Secondary | ICD-10-CM | POA: Diagnosis not present

## 2018-05-13 DIAGNOSIS — Z992 Dependence on renal dialysis: Secondary | ICD-10-CM | POA: Diagnosis not present

## 2018-05-13 DIAGNOSIS — R6883 Chills (without fever): Secondary | ICD-10-CM | POA: Diagnosis not present

## 2018-05-13 DIAGNOSIS — R7881 Bacteremia: Secondary | ICD-10-CM | POA: Diagnosis not present

## 2018-05-13 DIAGNOSIS — N186 End stage renal disease: Secondary | ICD-10-CM | POA: Diagnosis not present

## 2018-05-13 DIAGNOSIS — T85898A Other specified complication of other internal prosthetic devices, implants and grafts, initial encounter: Secondary | ICD-10-CM | POA: Diagnosis not present

## 2018-05-15 ENCOUNTER — Encounter: Admit: 2018-05-15 | Discharge: 2018-05-15 | Payer: MEDICARE | Attending: Internal Medicine | Primary: Internal Medicine

## 2018-05-15 DIAGNOSIS — R6883 Chills (without fever): Secondary | ICD-10-CM | POA: Diagnosis not present

## 2018-05-15 DIAGNOSIS — R509 Fever, unspecified: Secondary | ICD-10-CM | POA: Diagnosis not present

## 2018-05-15 DIAGNOSIS — Z7682 Awaiting organ transplant status: Secondary | ICD-10-CM | POA: Diagnosis not present

## 2018-05-15 DIAGNOSIS — R7881 Bacteremia: Secondary | ICD-10-CM | POA: Diagnosis not present

## 2018-05-15 DIAGNOSIS — T85898A Other specified complication of other internal prosthetic devices, implants and grafts, initial encounter: Secondary | ICD-10-CM | POA: Diagnosis not present

## 2018-05-15 DIAGNOSIS — Z992 Dependence on renal dialysis: Secondary | ICD-10-CM | POA: Diagnosis not present

## 2018-05-15 DIAGNOSIS — N186 End stage renal disease: Secondary | ICD-10-CM | POA: Diagnosis not present

## 2018-05-15 DIAGNOSIS — Z01818 Encounter for other preprocedural examination: Secondary | ICD-10-CM | POA: Diagnosis not present

## 2018-05-16 ENCOUNTER — Ambulatory Visit: Admit: 2018-05-16 | Discharge: 2018-05-16 | Payer: MEDICARE

## 2018-05-16 ENCOUNTER — Ambulatory Visit: Admit: 2018-05-16 | Discharge: 2018-05-16 | Payer: MEDICARE | Attending: Surgery | Primary: Surgery

## 2018-05-16 DIAGNOSIS — E039 Hypothyroidism, unspecified: Secondary | ICD-10-CM | POA: Diagnosis not present

## 2018-05-16 DIAGNOSIS — E1122 Type 2 diabetes mellitus with diabetic chronic kidney disease: Secondary | ICD-10-CM | POA: Diagnosis not present

## 2018-05-16 DIAGNOSIS — Z4889 Encounter for other specified surgical aftercare: Secondary | ICD-10-CM | POA: Diagnosis not present

## 2018-05-16 DIAGNOSIS — I120 Hypertensive chronic kidney disease with stage 5 chronic kidney disease or end stage renal disease: Secondary | ICD-10-CM | POA: Diagnosis not present

## 2018-05-16 DIAGNOSIS — N186 End stage renal disease: Secondary | ICD-10-CM | POA: Diagnosis not present

## 2018-05-16 DIAGNOSIS — N184 Chronic kidney disease, stage 4 (severe): Secondary | ICD-10-CM

## 2018-05-17 DIAGNOSIS — Z992 Dependence on renal dialysis: Secondary | ICD-10-CM | POA: Diagnosis not present

## 2018-05-17 DIAGNOSIS — R7881 Bacteremia: Secondary | ICD-10-CM | POA: Diagnosis not present

## 2018-05-17 DIAGNOSIS — R6883 Chills (without fever): Secondary | ICD-10-CM | POA: Diagnosis not present

## 2018-05-17 DIAGNOSIS — T85898A Other specified complication of other internal prosthetic devices, implants and grafts, initial encounter: Secondary | ICD-10-CM | POA: Diagnosis not present

## 2018-05-17 DIAGNOSIS — R509 Fever, unspecified: Secondary | ICD-10-CM | POA: Diagnosis not present

## 2018-05-17 DIAGNOSIS — N186 End stage renal disease: Secondary | ICD-10-CM | POA: Diagnosis not present

## 2018-05-20 DIAGNOSIS — N186 End stage renal disease: Secondary | ICD-10-CM | POA: Diagnosis not present

## 2018-05-20 DIAGNOSIS — Z992 Dependence on renal dialysis: Secondary | ICD-10-CM | POA: Diagnosis not present

## 2018-05-20 DIAGNOSIS — R7881 Bacteremia: Secondary | ICD-10-CM | POA: Diagnosis not present

## 2018-05-20 DIAGNOSIS — R6883 Chills (without fever): Secondary | ICD-10-CM | POA: Diagnosis not present

## 2018-05-20 DIAGNOSIS — R509 Fever, unspecified: Secondary | ICD-10-CM | POA: Diagnosis not present

## 2018-05-20 DIAGNOSIS — T85898A Other specified complication of other internal prosthetic devices, implants and grafts, initial encounter: Secondary | ICD-10-CM | POA: Diagnosis not present

## 2018-05-21 DIAGNOSIS — Z01818 Encounter for other preprocedural examination: Secondary | ICD-10-CM

## 2018-05-21 DIAGNOSIS — N186 End stage renal disease: Secondary | ICD-10-CM

## 2018-05-21 DIAGNOSIS — Z7682 Awaiting organ transplant status: Principal | ICD-10-CM

## 2018-05-22 DIAGNOSIS — E113412 Type 2 diabetes mellitus with severe nonproliferative diabetic retinopathy with macular edema, left eye: Secondary | ICD-10-CM | POA: Diagnosis not present

## 2018-05-23 DIAGNOSIS — T85898A Other specified complication of other internal prosthetic devices, implants and grafts, initial encounter: Secondary | ICD-10-CM | POA: Diagnosis not present

## 2018-05-23 DIAGNOSIS — Z992 Dependence on renal dialysis: Secondary | ICD-10-CM | POA: Diagnosis not present

## 2018-05-23 DIAGNOSIS — R509 Fever, unspecified: Secondary | ICD-10-CM | POA: Diagnosis not present

## 2018-05-23 DIAGNOSIS — N186 End stage renal disease: Secondary | ICD-10-CM | POA: Diagnosis not present

## 2018-05-23 DIAGNOSIS — R6883 Chills (without fever): Secondary | ICD-10-CM | POA: Diagnosis not present

## 2018-05-23 DIAGNOSIS — R7881 Bacteremia: Secondary | ICD-10-CM | POA: Diagnosis not present

## 2018-05-24 DIAGNOSIS — N186 End stage renal disease: Secondary | ICD-10-CM | POA: Diagnosis not present

## 2018-05-24 DIAGNOSIS — Z992 Dependence on renal dialysis: Secondary | ICD-10-CM | POA: Diagnosis not present

## 2018-05-24 DIAGNOSIS — Z23 Encounter for immunization: Secondary | ICD-10-CM | POA: Diagnosis not present

## 2018-05-27 DIAGNOSIS — N186 End stage renal disease: Secondary | ICD-10-CM | POA: Diagnosis not present

## 2018-05-27 DIAGNOSIS — Z992 Dependence on renal dialysis: Secondary | ICD-10-CM | POA: Diagnosis not present

## 2018-05-29 ENCOUNTER — Encounter: Admit: 2018-05-29 | Discharge: 2018-05-29 | Payer: MEDICARE | Attending: Internal Medicine | Primary: Internal Medicine

## 2018-05-29 DIAGNOSIS — Z992 Dependence on renal dialysis: Secondary | ICD-10-CM | POA: Diagnosis not present

## 2018-05-29 DIAGNOSIS — Z23 Encounter for immunization: Secondary | ICD-10-CM | POA: Diagnosis not present

## 2018-05-29 DIAGNOSIS — N186 End stage renal disease: Secondary | ICD-10-CM | POA: Diagnosis not present

## 2018-05-30 DIAGNOSIS — I871 Compression of vein: Secondary | ICD-10-CM | POA: Diagnosis not present

## 2018-05-30 DIAGNOSIS — T82858A Stenosis of vascular prosthetic devices, implants and grafts, initial encounter: Secondary | ICD-10-CM | POA: Diagnosis not present

## 2018-05-31 DIAGNOSIS — Z992 Dependence on renal dialysis: Secondary | ICD-10-CM | POA: Diagnosis not present

## 2018-05-31 DIAGNOSIS — Z23 Encounter for immunization: Secondary | ICD-10-CM | POA: Diagnosis not present

## 2018-05-31 DIAGNOSIS — N186 End stage renal disease: Secondary | ICD-10-CM | POA: Diagnosis not present

## 2018-06-03 DIAGNOSIS — N186 End stage renal disease: Secondary | ICD-10-CM | POA: Diagnosis not present

## 2018-06-03 DIAGNOSIS — Z992 Dependence on renal dialysis: Secondary | ICD-10-CM | POA: Diagnosis not present

## 2018-06-03 DIAGNOSIS — Z23 Encounter for immunization: Secondary | ICD-10-CM | POA: Diagnosis not present

## 2018-06-04 DIAGNOSIS — Z7682 Awaiting organ transplant status: Principal | ICD-10-CM

## 2018-06-04 DIAGNOSIS — Z01818 Encounter for other preprocedural examination: Secondary | ICD-10-CM

## 2018-06-04 DIAGNOSIS — N186 End stage renal disease: Secondary | ICD-10-CM

## 2018-06-06 DIAGNOSIS — Z23 Encounter for immunization: Secondary | ICD-10-CM | POA: Diagnosis not present

## 2018-06-06 DIAGNOSIS — N186 End stage renal disease: Secondary | ICD-10-CM | POA: Diagnosis not present

## 2018-06-06 DIAGNOSIS — Z992 Dependence on renal dialysis: Secondary | ICD-10-CM | POA: Diagnosis not present

## 2018-06-07 DIAGNOSIS — Z992 Dependence on renal dialysis: Secondary | ICD-10-CM | POA: Diagnosis not present

## 2018-06-07 DIAGNOSIS — Z23 Encounter for immunization: Secondary | ICD-10-CM | POA: Diagnosis not present

## 2018-06-07 DIAGNOSIS — N186 End stage renal disease: Secondary | ICD-10-CM | POA: Diagnosis not present

## 2018-06-10 DIAGNOSIS — N186 End stage renal disease: Secondary | ICD-10-CM | POA: Diagnosis not present

## 2018-06-10 DIAGNOSIS — Z23 Encounter for immunization: Secondary | ICD-10-CM | POA: Diagnosis not present

## 2018-06-10 DIAGNOSIS — Z992 Dependence on renal dialysis: Secondary | ICD-10-CM | POA: Diagnosis not present

## 2018-06-12 DIAGNOSIS — E113412 Type 2 diabetes mellitus with severe nonproliferative diabetic retinopathy with macular edema, left eye: Secondary | ICD-10-CM | POA: Diagnosis not present

## 2018-06-12 DIAGNOSIS — E1159 Type 2 diabetes mellitus with other circulatory complications: Secondary | ICD-10-CM | POA: Diagnosis not present

## 2018-06-12 DIAGNOSIS — Z992 Dependence on renal dialysis: Secondary | ICD-10-CM | POA: Diagnosis not present

## 2018-06-12 DIAGNOSIS — Z794 Long term (current) use of insulin: Secondary | ICD-10-CM | POA: Diagnosis not present

## 2018-06-12 DIAGNOSIS — E1122 Type 2 diabetes mellitus with diabetic chronic kidney disease: Secondary | ICD-10-CM | POA: Diagnosis not present

## 2018-06-12 DIAGNOSIS — I10 Essential (primary) hypertension: Secondary | ICD-10-CM | POA: Diagnosis not present

## 2018-06-12 DIAGNOSIS — E1142 Type 2 diabetes mellitus with diabetic polyneuropathy: Secondary | ICD-10-CM | POA: Diagnosis not present

## 2018-06-12 DIAGNOSIS — N186 End stage renal disease: Secondary | ICD-10-CM | POA: Diagnosis not present

## 2018-06-12 DIAGNOSIS — E113511 Type 2 diabetes mellitus with proliferative diabetic retinopathy with macular edema, right eye: Secondary | ICD-10-CM | POA: Diagnosis not present

## 2018-06-13 DIAGNOSIS — N186 End stage renal disease: Secondary | ICD-10-CM | POA: Diagnosis not present

## 2018-06-13 DIAGNOSIS — Z992 Dependence on renal dialysis: Secondary | ICD-10-CM | POA: Diagnosis not present

## 2018-06-13 DIAGNOSIS — Z23 Encounter for immunization: Secondary | ICD-10-CM | POA: Diagnosis not present

## 2018-06-14 DIAGNOSIS — Z23 Encounter for immunization: Secondary | ICD-10-CM | POA: Diagnosis not present

## 2018-06-14 DIAGNOSIS — N186 End stage renal disease: Secondary | ICD-10-CM | POA: Diagnosis not present

## 2018-06-14 DIAGNOSIS — Z992 Dependence on renal dialysis: Secondary | ICD-10-CM | POA: Diagnosis not present

## 2018-06-17 DIAGNOSIS — N186 End stage renal disease: Secondary | ICD-10-CM | POA: Diagnosis not present

## 2018-06-17 DIAGNOSIS — Z23 Encounter for immunization: Secondary | ICD-10-CM | POA: Diagnosis not present

## 2018-06-17 DIAGNOSIS — Z992 Dependence on renal dialysis: Secondary | ICD-10-CM | POA: Diagnosis not present

## 2018-06-18 DIAGNOSIS — Z452 Encounter for adjustment and management of vascular access device: Secondary | ICD-10-CM | POA: Diagnosis not present

## 2018-06-19 DIAGNOSIS — Z23 Encounter for immunization: Secondary | ICD-10-CM | POA: Diagnosis not present

## 2018-06-19 DIAGNOSIS — Z992 Dependence on renal dialysis: Secondary | ICD-10-CM | POA: Diagnosis not present

## 2018-06-19 DIAGNOSIS — N186 End stage renal disease: Secondary | ICD-10-CM | POA: Diagnosis not present

## 2018-06-21 DIAGNOSIS — N186 End stage renal disease: Secondary | ICD-10-CM | POA: Diagnosis not present

## 2018-06-21 DIAGNOSIS — Z23 Encounter for immunization: Secondary | ICD-10-CM | POA: Diagnosis not present

## 2018-06-21 DIAGNOSIS — Z992 Dependence on renal dialysis: Secondary | ICD-10-CM | POA: Diagnosis not present

## 2018-06-22 DIAGNOSIS — N186 End stage renal disease: Secondary | ICD-10-CM | POA: Diagnosis not present

## 2018-06-22 DIAGNOSIS — Z992 Dependence on renal dialysis: Secondary | ICD-10-CM | POA: Diagnosis not present

## 2018-06-24 DIAGNOSIS — Z992 Dependence on renal dialysis: Secondary | ICD-10-CM | POA: Diagnosis not present

## 2018-06-24 DIAGNOSIS — N186 End stage renal disease: Secondary | ICD-10-CM | POA: Diagnosis not present

## 2018-06-26 ENCOUNTER — Encounter: Admit: 2018-06-26 | Discharge: 2018-06-26 | Payer: MEDICARE | Attending: Internal Medicine | Primary: Internal Medicine

## 2018-06-26 DIAGNOSIS — Z992 Dependence on renal dialysis: Secondary | ICD-10-CM | POA: Diagnosis not present

## 2018-06-26 DIAGNOSIS — N186 End stage renal disease: Secondary | ICD-10-CM | POA: Diagnosis not present

## 2018-06-28 DIAGNOSIS — Z992 Dependence on renal dialysis: Secondary | ICD-10-CM | POA: Diagnosis not present

## 2018-06-28 DIAGNOSIS — N186 End stage renal disease: Secondary | ICD-10-CM | POA: Diagnosis not present

## 2018-07-01 DIAGNOSIS — Z992 Dependence on renal dialysis: Secondary | ICD-10-CM | POA: Diagnosis not present

## 2018-07-01 DIAGNOSIS — E119 Type 2 diabetes mellitus without complications: Secondary | ICD-10-CM | POA: Diagnosis not present

## 2018-07-01 DIAGNOSIS — N186 End stage renal disease: Secondary | ICD-10-CM | POA: Diagnosis not present

## 2018-07-03 DIAGNOSIS — N186 End stage renal disease: Secondary | ICD-10-CM | POA: Diagnosis not present

## 2018-07-03 DIAGNOSIS — Z992 Dependence on renal dialysis: Secondary | ICD-10-CM | POA: Diagnosis not present

## 2018-07-03 DIAGNOSIS — Z01818 Encounter for other preprocedural examination: Secondary | ICD-10-CM

## 2018-07-03 DIAGNOSIS — Z7682 Awaiting organ transplant status: Principal | ICD-10-CM

## 2018-07-05 DIAGNOSIS — Z992 Dependence on renal dialysis: Secondary | ICD-10-CM | POA: Diagnosis not present

## 2018-07-05 DIAGNOSIS — N186 End stage renal disease: Secondary | ICD-10-CM | POA: Diagnosis not present

## 2018-07-08 DIAGNOSIS — Z992 Dependence on renal dialysis: Secondary | ICD-10-CM | POA: Diagnosis not present

## 2018-07-08 DIAGNOSIS — N186 End stage renal disease: Secondary | ICD-10-CM | POA: Diagnosis not present

## 2018-07-10 DIAGNOSIS — Z992 Dependence on renal dialysis: Secondary | ICD-10-CM | POA: Diagnosis not present

## 2018-07-10 DIAGNOSIS — N186 End stage renal disease: Secondary | ICD-10-CM | POA: Diagnosis not present

## 2018-07-12 DIAGNOSIS — N186 End stage renal disease: Secondary | ICD-10-CM | POA: Diagnosis not present

## 2018-07-12 DIAGNOSIS — Z992 Dependence on renal dialysis: Secondary | ICD-10-CM | POA: Diagnosis not present

## 2018-07-15 DIAGNOSIS — N186 End stage renal disease: Secondary | ICD-10-CM | POA: Diagnosis not present

## 2018-07-15 DIAGNOSIS — Z992 Dependence on renal dialysis: Secondary | ICD-10-CM | POA: Diagnosis not present

## 2018-07-18 DIAGNOSIS — Z992 Dependence on renal dialysis: Secondary | ICD-10-CM | POA: Diagnosis not present

## 2018-07-18 DIAGNOSIS — N186 End stage renal disease: Secondary | ICD-10-CM | POA: Diagnosis not present

## 2018-07-19 DIAGNOSIS — N186 End stage renal disease: Secondary | ICD-10-CM | POA: Diagnosis not present

## 2018-07-19 DIAGNOSIS — Z992 Dependence on renal dialysis: Secondary | ICD-10-CM | POA: Diagnosis not present

## 2018-07-22 DIAGNOSIS — N186 End stage renal disease: Secondary | ICD-10-CM | POA: Diagnosis not present

## 2018-07-22 DIAGNOSIS — Z992 Dependence on renal dialysis: Secondary | ICD-10-CM | POA: Diagnosis not present

## 2018-07-23 DIAGNOSIS — N186 End stage renal disease: Secondary | ICD-10-CM | POA: Diagnosis not present

## 2018-07-23 DIAGNOSIS — Z992 Dependence on renal dialysis: Secondary | ICD-10-CM | POA: Diagnosis not present

## 2018-07-24 DIAGNOSIS — Z992 Dependence on renal dialysis: Secondary | ICD-10-CM | POA: Diagnosis not present

## 2018-07-24 DIAGNOSIS — N186 End stage renal disease: Secondary | ICD-10-CM | POA: Diagnosis not present

## 2018-07-24 DIAGNOSIS — Z23 Encounter for immunization: Secondary | ICD-10-CM | POA: Diagnosis not present

## 2018-07-26 DIAGNOSIS — Z23 Encounter for immunization: Secondary | ICD-10-CM | POA: Diagnosis not present

## 2018-07-26 DIAGNOSIS — N186 End stage renal disease: Secondary | ICD-10-CM | POA: Diagnosis not present

## 2018-07-26 DIAGNOSIS — Z992 Dependence on renal dialysis: Secondary | ICD-10-CM | POA: Diagnosis not present

## 2018-07-29 DIAGNOSIS — N186 End stage renal disease: Secondary | ICD-10-CM | POA: Diagnosis not present

## 2018-07-29 DIAGNOSIS — Z992 Dependence on renal dialysis: Secondary | ICD-10-CM | POA: Diagnosis not present

## 2018-07-29 DIAGNOSIS — Z23 Encounter for immunization: Secondary | ICD-10-CM | POA: Diagnosis not present

## 2018-07-31 DIAGNOSIS — N186 End stage renal disease: Secondary | ICD-10-CM | POA: Diagnosis not present

## 2018-07-31 DIAGNOSIS — Z23 Encounter for immunization: Secondary | ICD-10-CM | POA: Diagnosis not present

## 2018-07-31 DIAGNOSIS — Z992 Dependence on renal dialysis: Secondary | ICD-10-CM | POA: Diagnosis not present

## 2018-08-02 DIAGNOSIS — Z992 Dependence on renal dialysis: Secondary | ICD-10-CM | POA: Diagnosis not present

## 2018-08-02 DIAGNOSIS — Z23 Encounter for immunization: Secondary | ICD-10-CM | POA: Diagnosis not present

## 2018-08-02 DIAGNOSIS — N186 End stage renal disease: Secondary | ICD-10-CM | POA: Diagnosis not present

## 2018-08-05 ENCOUNTER — Encounter: Admit: 2018-08-05 | Discharge: 2018-08-05 | Payer: MEDICARE | Attending: Internal Medicine | Primary: Internal Medicine

## 2018-08-05 DIAGNOSIS — N186 End stage renal disease: Secondary | ICD-10-CM | POA: Diagnosis not present

## 2018-08-05 DIAGNOSIS — Z7682 Awaiting organ transplant status: Secondary | ICD-10-CM | POA: Diagnosis not present

## 2018-08-05 DIAGNOSIS — E039 Hypothyroidism, unspecified: Secondary | ICD-10-CM | POA: Diagnosis not present

## 2018-08-05 DIAGNOSIS — Z01818 Encounter for other preprocedural examination: Secondary | ICD-10-CM | POA: Diagnosis not present

## 2018-08-05 DIAGNOSIS — Z992 Dependence on renal dialysis: Secondary | ICD-10-CM | POA: Diagnosis not present

## 2018-08-05 DIAGNOSIS — Z23 Encounter for immunization: Secondary | ICD-10-CM | POA: Diagnosis not present

## 2018-08-07 DIAGNOSIS — Z23 Encounter for immunization: Secondary | ICD-10-CM | POA: Diagnosis not present

## 2018-08-07 DIAGNOSIS — Z992 Dependence on renal dialysis: Secondary | ICD-10-CM | POA: Diagnosis not present

## 2018-08-07 DIAGNOSIS — N186 End stage renal disease: Secondary | ICD-10-CM | POA: Diagnosis not present

## 2018-08-09 DIAGNOSIS — Z992 Dependence on renal dialysis: Secondary | ICD-10-CM | POA: Diagnosis not present

## 2018-08-09 DIAGNOSIS — Z23 Encounter for immunization: Secondary | ICD-10-CM | POA: Diagnosis not present

## 2018-08-09 DIAGNOSIS — N186 End stage renal disease: Secondary | ICD-10-CM | POA: Diagnosis not present

## 2018-08-09 DIAGNOSIS — Z01818 Encounter for other preprocedural examination: Secondary | ICD-10-CM

## 2018-08-09 DIAGNOSIS — Z7682 Awaiting organ transplant status: Principal | ICD-10-CM

## 2018-08-12 DIAGNOSIS — N186 End stage renal disease: Secondary | ICD-10-CM | POA: Diagnosis not present

## 2018-08-12 DIAGNOSIS — Z992 Dependence on renal dialysis: Secondary | ICD-10-CM | POA: Diagnosis not present

## 2018-08-12 DIAGNOSIS — Z23 Encounter for immunization: Secondary | ICD-10-CM | POA: Diagnosis not present

## 2018-08-14 DIAGNOSIS — Z992 Dependence on renal dialysis: Secondary | ICD-10-CM | POA: Diagnosis not present

## 2018-08-14 DIAGNOSIS — Z23 Encounter for immunization: Secondary | ICD-10-CM | POA: Diagnosis not present

## 2018-08-14 DIAGNOSIS — N186 End stage renal disease: Secondary | ICD-10-CM | POA: Diagnosis not present

## 2018-08-15 DIAGNOSIS — E113413 Type 2 diabetes mellitus with severe nonproliferative diabetic retinopathy with macular edema, bilateral: Secondary | ICD-10-CM | POA: Diagnosis not present

## 2018-08-16 DIAGNOSIS — Z992 Dependence on renal dialysis: Secondary | ICD-10-CM | POA: Diagnosis not present

## 2018-08-16 DIAGNOSIS — N186 End stage renal disease: Secondary | ICD-10-CM | POA: Diagnosis not present

## 2018-08-16 DIAGNOSIS — Z23 Encounter for immunization: Secondary | ICD-10-CM | POA: Diagnosis not present

## 2018-08-19 DIAGNOSIS — Z992 Dependence on renal dialysis: Secondary | ICD-10-CM | POA: Diagnosis not present

## 2018-08-19 DIAGNOSIS — N186 End stage renal disease: Secondary | ICD-10-CM | POA: Diagnosis not present

## 2018-08-19 DIAGNOSIS — Z23 Encounter for immunization: Secondary | ICD-10-CM | POA: Diagnosis not present

## 2018-08-20 DIAGNOSIS — E1122 Type 2 diabetes mellitus with diabetic chronic kidney disease: Secondary | ICD-10-CM | POA: Diagnosis not present

## 2018-08-20 DIAGNOSIS — Z992 Dependence on renal dialysis: Secondary | ICD-10-CM | POA: Diagnosis not present

## 2018-08-20 DIAGNOSIS — E782 Mixed hyperlipidemia: Secondary | ICD-10-CM | POA: Diagnosis not present

## 2018-08-20 DIAGNOSIS — I10 Essential (primary) hypertension: Secondary | ICD-10-CM | POA: Diagnosis not present

## 2018-08-20 DIAGNOSIS — N186 End stage renal disease: Secondary | ICD-10-CM | POA: Diagnosis not present

## 2018-08-20 DIAGNOSIS — D649 Anemia, unspecified: Secondary | ICD-10-CM | POA: Diagnosis not present

## 2018-08-20 DIAGNOSIS — Z794 Long term (current) use of insulin: Secondary | ICD-10-CM | POA: Diagnosis not present

## 2018-08-20 DIAGNOSIS — E039 Hypothyroidism, unspecified: Secondary | ICD-10-CM | POA: Diagnosis not present

## 2018-08-20 DIAGNOSIS — R8290 Unspecified abnormal findings in urine: Secondary | ICD-10-CM | POA: Diagnosis not present

## 2018-08-20 DIAGNOSIS — Z955 Presence of coronary angioplasty implant and graft: Secondary | ICD-10-CM | POA: Diagnosis not present

## 2018-08-21 DIAGNOSIS — N186 End stage renal disease: Secondary | ICD-10-CM | POA: Diagnosis not present

## 2018-08-21 DIAGNOSIS — Z992 Dependence on renal dialysis: Secondary | ICD-10-CM | POA: Diagnosis not present

## 2018-08-21 DIAGNOSIS — Z23 Encounter for immunization: Secondary | ICD-10-CM | POA: Diagnosis not present

## 2018-08-22 DIAGNOSIS — T82858A Stenosis of vascular prosthetic devices, implants and grafts, initial encounter: Secondary | ICD-10-CM | POA: Diagnosis not present

## 2018-08-22 DIAGNOSIS — I872 Venous insufficiency (chronic) (peripheral): Secondary | ICD-10-CM | POA: Diagnosis not present

## 2018-08-23 DIAGNOSIS — N186 End stage renal disease: Secondary | ICD-10-CM | POA: Diagnosis not present

## 2018-08-23 DIAGNOSIS — Z992 Dependence on renal dialysis: Secondary | ICD-10-CM | POA: Diagnosis not present

## 2018-08-23 DIAGNOSIS — Z23 Encounter for immunization: Secondary | ICD-10-CM | POA: Diagnosis not present

## 2018-08-26 ENCOUNTER — Encounter: Admit: 2018-08-26 | Discharge: 2018-08-26 | Payer: MEDICARE | Attending: Internal Medicine | Primary: Internal Medicine

## 2018-08-26 DIAGNOSIS — Z992 Dependence on renal dialysis: Secondary | ICD-10-CM | POA: Diagnosis not present

## 2018-08-26 DIAGNOSIS — N186 End stage renal disease: Secondary | ICD-10-CM | POA: Diagnosis not present

## 2018-08-26 DIAGNOSIS — E119 Type 2 diabetes mellitus without complications: Secondary | ICD-10-CM | POA: Diagnosis not present

## 2018-08-28 DIAGNOSIS — Z992 Dependence on renal dialysis: Secondary | ICD-10-CM | POA: Diagnosis not present

## 2018-08-28 DIAGNOSIS — N186 End stage renal disease: Secondary | ICD-10-CM | POA: Diagnosis not present

## 2018-08-29 ENCOUNTER — Ambulatory Visit: Admit: 2018-08-29 | Discharge: 2018-08-30 | Payer: MEDICARE | Attending: Surgery | Primary: Surgery

## 2018-08-29 DIAGNOSIS — N186 End stage renal disease: Secondary | ICD-10-CM | POA: Diagnosis not present

## 2018-08-30 DIAGNOSIS — Z992 Dependence on renal dialysis: Secondary | ICD-10-CM | POA: Diagnosis not present

## 2018-08-30 DIAGNOSIS — N186 End stage renal disease: Secondary | ICD-10-CM | POA: Diagnosis not present

## 2018-08-30 DIAGNOSIS — Z7682 Awaiting organ transplant status: Principal | ICD-10-CM

## 2018-08-30 DIAGNOSIS — Z01818 Encounter for other preprocedural examination: Secondary | ICD-10-CM

## 2018-09-02 DIAGNOSIS — Z992 Dependence on renal dialysis: Secondary | ICD-10-CM | POA: Diagnosis not present

## 2018-09-02 DIAGNOSIS — N186 End stage renal disease: Secondary | ICD-10-CM | POA: Diagnosis not present

## 2018-09-03 DIAGNOSIS — Z992 Dependence on renal dialysis: Secondary | ICD-10-CM | POA: Diagnosis not present

## 2018-09-03 DIAGNOSIS — N186 End stage renal disease: Secondary | ICD-10-CM | POA: Diagnosis not present

## 2018-09-03 DIAGNOSIS — E1122 Type 2 diabetes mellitus with diabetic chronic kidney disease: Secondary | ICD-10-CM | POA: Diagnosis not present

## 2018-09-03 DIAGNOSIS — N390 Urinary tract infection, site not specified: Secondary | ICD-10-CM | POA: Diagnosis not present

## 2018-09-03 DIAGNOSIS — E039 Hypothyroidism, unspecified: Secondary | ICD-10-CM | POA: Diagnosis not present

## 2018-09-03 DIAGNOSIS — Z0000 Encounter for general adult medical examination without abnormal findings: Secondary | ICD-10-CM | POA: Diagnosis not present

## 2018-09-03 DIAGNOSIS — I5041 Acute combined systolic (congestive) and diastolic (congestive) heart failure: Secondary | ICD-10-CM | POA: Diagnosis not present

## 2018-09-03 DIAGNOSIS — I10 Essential (primary) hypertension: Secondary | ICD-10-CM | POA: Diagnosis not present

## 2018-09-03 DIAGNOSIS — M25531 Pain in right wrist: Secondary | ICD-10-CM | POA: Diagnosis not present

## 2018-09-04 DIAGNOSIS — N186 End stage renal disease: Secondary | ICD-10-CM | POA: Diagnosis not present

## 2018-09-04 DIAGNOSIS — Z992 Dependence on renal dialysis: Secondary | ICD-10-CM | POA: Diagnosis not present

## 2018-09-05 DIAGNOSIS — Z992 Dependence on renal dialysis: Secondary | ICD-10-CM | POA: Diagnosis not present

## 2018-09-05 DIAGNOSIS — N186 End stage renal disease: Secondary | ICD-10-CM | POA: Diagnosis not present

## 2018-09-09 DIAGNOSIS — Z992 Dependence on renal dialysis: Secondary | ICD-10-CM | POA: Diagnosis not present

## 2018-09-09 DIAGNOSIS — N186 End stage renal disease: Secondary | ICD-10-CM | POA: Diagnosis not present

## 2018-09-11 DIAGNOSIS — Z992 Dependence on renal dialysis: Secondary | ICD-10-CM | POA: Diagnosis not present

## 2018-09-11 DIAGNOSIS — N186 End stage renal disease: Secondary | ICD-10-CM | POA: Diagnosis not present

## 2018-09-12 DIAGNOSIS — T82858A Stenosis of vascular prosthetic devices, implants and grafts, initial encounter: Secondary | ICD-10-CM | POA: Diagnosis not present

## 2018-09-12 DIAGNOSIS — I872 Venous insufficiency (chronic) (peripheral): Secondary | ICD-10-CM | POA: Diagnosis not present

## 2018-09-13 DIAGNOSIS — N186 End stage renal disease: Secondary | ICD-10-CM | POA: Diagnosis not present

## 2018-09-13 DIAGNOSIS — Z992 Dependence on renal dialysis: Secondary | ICD-10-CM | POA: Diagnosis not present

## 2018-09-16 DIAGNOSIS — Z992 Dependence on renal dialysis: Secondary | ICD-10-CM | POA: Diagnosis not present

## 2018-09-16 DIAGNOSIS — N186 End stage renal disease: Secondary | ICD-10-CM | POA: Diagnosis not present

## 2018-09-18 DIAGNOSIS — N186 End stage renal disease: Secondary | ICD-10-CM | POA: Diagnosis not present

## 2018-09-18 DIAGNOSIS — Z992 Dependence on renal dialysis: Secondary | ICD-10-CM | POA: Diagnosis not present

## 2018-09-19 DIAGNOSIS — E113413 Type 2 diabetes mellitus with severe nonproliferative diabetic retinopathy with macular edema, bilateral: Secondary | ICD-10-CM | POA: Diagnosis not present

## 2018-09-20 DIAGNOSIS — N186 End stage renal disease: Secondary | ICD-10-CM | POA: Diagnosis not present

## 2018-09-20 DIAGNOSIS — Z992 Dependence on renal dialysis: Secondary | ICD-10-CM | POA: Diagnosis not present

## 2018-09-21 DIAGNOSIS — Z992 Dependence on renal dialysis: Secondary | ICD-10-CM | POA: Diagnosis not present

## 2018-09-21 DIAGNOSIS — N186 End stage renal disease: Secondary | ICD-10-CM | POA: Diagnosis not present

## 2018-09-23 DIAGNOSIS — Z992 Dependence on renal dialysis: Secondary | ICD-10-CM | POA: Diagnosis not present

## 2018-09-23 DIAGNOSIS — N186 End stage renal disease: Secondary | ICD-10-CM | POA: Diagnosis not present

## 2018-09-25 ENCOUNTER — Encounter: Admit: 2018-09-25 | Discharge: 2018-09-25 | Payer: MEDICARE | Attending: Internal Medicine | Primary: Internal Medicine

## 2018-09-25 DIAGNOSIS — Z992 Dependence on renal dialysis: Secondary | ICD-10-CM | POA: Diagnosis not present

## 2018-09-25 DIAGNOSIS — N186 End stage renal disease: Secondary | ICD-10-CM | POA: Diagnosis not present

## 2018-09-26 DIAGNOSIS — E039 Hypothyroidism, unspecified: Principal | ICD-10-CM

## 2018-09-26 DIAGNOSIS — Z880 Allergy status to penicillin: Principal | ICD-10-CM

## 2018-09-26 DIAGNOSIS — Z794 Long term (current) use of insulin: Principal | ICD-10-CM

## 2018-09-26 DIAGNOSIS — E1122 Type 2 diabetes mellitus with diabetic chronic kidney disease: Principal | ICD-10-CM

## 2018-09-26 DIAGNOSIS — I120 Hypertensive chronic kidney disease with stage 5 chronic kidney disease or end stage renal disease: Principal | ICD-10-CM

## 2018-09-26 DIAGNOSIS — Z888 Allergy status to other drugs, medicaments and biological substances status: Principal | ICD-10-CM

## 2018-09-26 DIAGNOSIS — Z87891 Personal history of nicotine dependence: Principal | ICD-10-CM

## 2018-09-26 DIAGNOSIS — E785 Hyperlipidemia, unspecified: Principal | ICD-10-CM

## 2018-09-26 DIAGNOSIS — Z992 Dependence on renal dialysis: Principal | ICD-10-CM

## 2018-09-26 DIAGNOSIS — Z7982 Long term (current) use of aspirin: Principal | ICD-10-CM

## 2018-09-26 DIAGNOSIS — N186 End stage renal disease: Principal | ICD-10-CM

## 2018-09-26 DIAGNOSIS — I2510 Atherosclerotic heart disease of native coronary artery without angina pectoris: Principal | ICD-10-CM

## 2018-09-26 DIAGNOSIS — Z79899 Other long term (current) drug therapy: Principal | ICD-10-CM

## 2018-09-27 ENCOUNTER — Encounter
Admit: 2018-09-27 | Discharge: 2018-09-27 | Payer: MEDICARE | Attending: Student in an Organized Health Care Education/Training Program | Primary: Student in an Organized Health Care Education/Training Program

## 2018-09-27 ENCOUNTER — Ambulatory Visit: Admit: 2018-09-27 | Discharge: 2018-09-27 | Payer: MEDICARE

## 2018-09-27 DIAGNOSIS — Z4902 Encounter for fitting and adjustment of peritoneal dialysis catheter: Secondary | ICD-10-CM | POA: Diagnosis not present

## 2018-09-27 DIAGNOSIS — N186 End stage renal disease: Secondary | ICD-10-CM | POA: Diagnosis not present

## 2018-09-27 DIAGNOSIS — I120 Hypertensive chronic kidney disease with stage 5 chronic kidney disease or end stage renal disease: Secondary | ICD-10-CM | POA: Diagnosis not present

## 2018-09-27 DIAGNOSIS — Z880 Allergy status to penicillin: Secondary | ICD-10-CM | POA: Diagnosis not present

## 2018-09-27 DIAGNOSIS — Z7982 Long term (current) use of aspirin: Secondary | ICD-10-CM | POA: Diagnosis not present

## 2018-09-27 DIAGNOSIS — E1122 Type 2 diabetes mellitus with diabetic chronic kidney disease: Secondary | ICD-10-CM | POA: Diagnosis not present

## 2018-09-27 DIAGNOSIS — Z79899 Other long term (current) drug therapy: Secondary | ICD-10-CM | POA: Diagnosis not present

## 2018-09-27 DIAGNOSIS — Z888 Allergy status to other drugs, medicaments and biological substances status: Secondary | ICD-10-CM | POA: Diagnosis not present

## 2018-09-27 DIAGNOSIS — K660 Peritoneal adhesions (postprocedural) (postinfection): Secondary | ICD-10-CM | POA: Diagnosis not present

## 2018-09-27 DIAGNOSIS — E039 Hypothyroidism, unspecified: Secondary | ICD-10-CM | POA: Diagnosis not present

## 2018-09-27 DIAGNOSIS — Z992 Dependence on renal dialysis: Secondary | ICD-10-CM | POA: Diagnosis not present

## 2018-09-27 DIAGNOSIS — Z87891 Personal history of nicotine dependence: Principal | ICD-10-CM

## 2018-09-27 DIAGNOSIS — E785 Hyperlipidemia, unspecified: Principal | ICD-10-CM

## 2018-09-27 DIAGNOSIS — Z794 Long term (current) use of insulin: Principal | ICD-10-CM

## 2018-09-27 DIAGNOSIS — I10 Essential (primary) hypertension: Principal | ICD-10-CM

## 2018-09-27 DIAGNOSIS — I2510 Atherosclerotic heart disease of native coronary artery without angina pectoris: Principal | ICD-10-CM

## 2018-09-27 DIAGNOSIS — D649 Anemia, unspecified: Principal | ICD-10-CM

## 2018-09-27 DIAGNOSIS — E119 Type 2 diabetes mellitus without complications: Principal | ICD-10-CM

## 2018-09-27 MED ORDER — TRAMADOL 50 MG TABLET
Freq: Four times a day (QID) | ORAL | 0 refills | 0.00000 days | Status: CP | PRN
Start: 2018-09-27 — End: ?

## 2018-09-27 MED ORDER — OXYCODONE 5 MG TABLET
ORAL_TABLET | ORAL | 0 refills | 0.00000 days | Status: CP | PRN
Start: 2018-09-27 — End: 2018-09-27

## 2018-09-28 DIAGNOSIS — N186 End stage renal disease: Secondary | ICD-10-CM | POA: Diagnosis not present

## 2018-09-28 DIAGNOSIS — Z992 Dependence on renal dialysis: Secondary | ICD-10-CM | POA: Diagnosis not present

## 2018-09-30 DIAGNOSIS — N186 End stage renal disease: Secondary | ICD-10-CM | POA: Diagnosis not present

## 2018-09-30 DIAGNOSIS — Z992 Dependence on renal dialysis: Secondary | ICD-10-CM | POA: Diagnosis not present

## 2018-10-01 DIAGNOSIS — Z7682 Awaiting organ transplant status: Principal | ICD-10-CM

## 2018-10-01 DIAGNOSIS — N186 End stage renal disease: Principal | ICD-10-CM

## 2018-10-01 DIAGNOSIS — Z01818 Encounter for other preprocedural examination: Principal | ICD-10-CM

## 2018-10-02 DIAGNOSIS — N186 End stage renal disease: Secondary | ICD-10-CM | POA: Diagnosis not present

## 2018-10-02 DIAGNOSIS — Z992 Dependence on renal dialysis: Secondary | ICD-10-CM | POA: Diagnosis not present

## 2018-10-02 DIAGNOSIS — E119 Type 2 diabetes mellitus without complications: Principal | ICD-10-CM

## 2018-10-02 DIAGNOSIS — E039 Hypothyroidism, unspecified: Principal | ICD-10-CM

## 2018-10-02 DIAGNOSIS — D649 Anemia, unspecified: Principal | ICD-10-CM

## 2018-10-02 DIAGNOSIS — I10 Essential (primary) hypertension: Principal | ICD-10-CM

## 2018-10-04 DIAGNOSIS — Z992 Dependence on renal dialysis: Secondary | ICD-10-CM | POA: Diagnosis not present

## 2018-10-04 DIAGNOSIS — N186 End stage renal disease: Secondary | ICD-10-CM | POA: Diagnosis not present

## 2018-10-07 DIAGNOSIS — Z992 Dependence on renal dialysis: Secondary | ICD-10-CM | POA: Diagnosis not present

## 2018-10-07 DIAGNOSIS — N186 End stage renal disease: Secondary | ICD-10-CM | POA: Diagnosis not present

## 2018-10-09 DIAGNOSIS — N186 End stage renal disease: Secondary | ICD-10-CM | POA: Diagnosis not present

## 2018-10-09 DIAGNOSIS — Z992 Dependence on renal dialysis: Secondary | ICD-10-CM | POA: Diagnosis not present

## 2018-10-10 DIAGNOSIS — Z992 Dependence on renal dialysis: Secondary | ICD-10-CM | POA: Diagnosis not present

## 2018-10-10 DIAGNOSIS — Z794 Long term (current) use of insulin: Secondary | ICD-10-CM | POA: Diagnosis not present

## 2018-10-10 DIAGNOSIS — N186 End stage renal disease: Secondary | ICD-10-CM | POA: Diagnosis not present

## 2018-10-10 DIAGNOSIS — I10 Essential (primary) hypertension: Secondary | ICD-10-CM | POA: Diagnosis not present

## 2018-10-10 DIAGNOSIS — E113511 Type 2 diabetes mellitus with proliferative diabetic retinopathy with macular edema, right eye: Secondary | ICD-10-CM | POA: Diagnosis not present

## 2018-10-10 DIAGNOSIS — E1142 Type 2 diabetes mellitus with diabetic polyneuropathy: Secondary | ICD-10-CM | POA: Diagnosis not present

## 2018-10-10 DIAGNOSIS — E1159 Type 2 diabetes mellitus with other circulatory complications: Secondary | ICD-10-CM | POA: Diagnosis not present

## 2018-10-10 DIAGNOSIS — E1122 Type 2 diabetes mellitus with diabetic chronic kidney disease: Secondary | ICD-10-CM | POA: Diagnosis not present

## 2018-10-10 DIAGNOSIS — E113412 Type 2 diabetes mellitus with severe nonproliferative diabetic retinopathy with macular edema, left eye: Secondary | ICD-10-CM | POA: Diagnosis not present

## 2018-10-11 DIAGNOSIS — Z992 Dependence on renal dialysis: Secondary | ICD-10-CM | POA: Diagnosis not present

## 2018-10-11 DIAGNOSIS — N186 End stage renal disease: Secondary | ICD-10-CM | POA: Diagnosis not present

## 2018-10-14 DIAGNOSIS — Z992 Dependence on renal dialysis: Secondary | ICD-10-CM | POA: Diagnosis not present

## 2018-10-14 DIAGNOSIS — N186 End stage renal disease: Secondary | ICD-10-CM | POA: Diagnosis not present

## 2018-10-15 DIAGNOSIS — N186 End stage renal disease: Secondary | ICD-10-CM | POA: Diagnosis not present

## 2018-10-15 DIAGNOSIS — Z992 Dependence on renal dialysis: Secondary | ICD-10-CM | POA: Diagnosis not present

## 2018-10-16 DIAGNOSIS — N186 End stage renal disease: Secondary | ICD-10-CM | POA: Diagnosis not present

## 2018-10-16 DIAGNOSIS — Z992 Dependence on renal dialysis: Secondary | ICD-10-CM | POA: Diagnosis not present

## 2018-10-17 DIAGNOSIS — Z992 Dependence on renal dialysis: Secondary | ICD-10-CM | POA: Diagnosis not present

## 2018-10-17 DIAGNOSIS — N186 End stage renal disease: Secondary | ICD-10-CM | POA: Diagnosis not present

## 2018-10-18 DIAGNOSIS — Z992 Dependence on renal dialysis: Secondary | ICD-10-CM | POA: Diagnosis not present

## 2018-10-18 DIAGNOSIS — N186 End stage renal disease: Secondary | ICD-10-CM | POA: Diagnosis not present

## 2018-10-22 DIAGNOSIS — N186 End stage renal disease: Secondary | ICD-10-CM | POA: Diagnosis not present

## 2018-10-22 DIAGNOSIS — Z992 Dependence on renal dialysis: Secondary | ICD-10-CM | POA: Diagnosis not present

## 2018-10-24 DIAGNOSIS — N186 End stage renal disease: Secondary | ICD-10-CM | POA: Diagnosis not present

## 2018-10-24 DIAGNOSIS — Z992 Dependence on renal dialysis: Secondary | ICD-10-CM | POA: Diagnosis not present

## 2018-10-25 DIAGNOSIS — Z992 Dependence on renal dialysis: Secondary | ICD-10-CM | POA: Diagnosis not present

## 2018-10-25 DIAGNOSIS — N186 End stage renal disease: Secondary | ICD-10-CM | POA: Diagnosis not present

## 2018-10-26 DIAGNOSIS — Z992 Dependence on renal dialysis: Secondary | ICD-10-CM | POA: Diagnosis not present

## 2018-10-26 DIAGNOSIS — N186 End stage renal disease: Secondary | ICD-10-CM | POA: Diagnosis not present

## 2018-10-27 DIAGNOSIS — Z992 Dependence on renal dialysis: Secondary | ICD-10-CM | POA: Diagnosis not present

## 2018-10-27 DIAGNOSIS — N186 End stage renal disease: Secondary | ICD-10-CM | POA: Diagnosis not present

## 2018-10-28 DIAGNOSIS — Z992 Dependence on renal dialysis: Secondary | ICD-10-CM | POA: Diagnosis not present

## 2018-10-28 DIAGNOSIS — N186 End stage renal disease: Secondary | ICD-10-CM | POA: Diagnosis not present

## 2018-10-29 ENCOUNTER — Encounter: Admit: 2018-10-29 | Discharge: 2018-10-29 | Payer: MEDICARE | Attending: Internal Medicine | Primary: Internal Medicine

## 2018-10-29 DIAGNOSIS — Z7682 Awaiting organ transplant status: Secondary | ICD-10-CM | POA: Diagnosis not present

## 2018-10-29 DIAGNOSIS — E119 Type 2 diabetes mellitus without complications: Secondary | ICD-10-CM | POA: Diagnosis not present

## 2018-10-29 DIAGNOSIS — N186 End stage renal disease: Secondary | ICD-10-CM | POA: Diagnosis not present

## 2018-10-29 DIAGNOSIS — Z01818 Encounter for other preprocedural examination: Secondary | ICD-10-CM | POA: Diagnosis not present

## 2018-10-29 DIAGNOSIS — Z794 Long term (current) use of insulin: Secondary | ICD-10-CM | POA: Diagnosis not present

## 2018-10-29 DIAGNOSIS — E785 Hyperlipidemia, unspecified: Secondary | ICD-10-CM | POA: Diagnosis not present

## 2018-10-29 DIAGNOSIS — Z992 Dependence on renal dialysis: Secondary | ICD-10-CM | POA: Diagnosis not present

## 2018-10-30 DIAGNOSIS — I5041 Acute combined systolic (congestive) and diastolic (congestive) heart failure: Secondary | ICD-10-CM | POA: Diagnosis not present

## 2018-10-30 DIAGNOSIS — Z992 Dependence on renal dialysis: Secondary | ICD-10-CM | POA: Diagnosis not present

## 2018-10-30 DIAGNOSIS — E1159 Type 2 diabetes mellitus with other circulatory complications: Secondary | ICD-10-CM | POA: Diagnosis not present

## 2018-10-30 DIAGNOSIS — R531 Weakness: Secondary | ICD-10-CM | POA: Diagnosis not present

## 2018-10-30 DIAGNOSIS — I10 Essential (primary) hypertension: Secondary | ICD-10-CM | POA: Diagnosis not present

## 2018-10-30 DIAGNOSIS — N186 End stage renal disease: Secondary | ICD-10-CM | POA: Diagnosis not present

## 2018-10-31 ENCOUNTER — Encounter: Payer: Self-pay | Admitting: *Deleted

## 2018-10-31 ENCOUNTER — Other Ambulatory Visit: Payer: Self-pay

## 2018-10-31 ENCOUNTER — Emergency Department
Admission: EM | Admit: 2018-10-31 | Discharge: 2018-11-01 | Disposition: A | Discharge status: Home / Self Care | Payer: Medicare HMO | Attending: Emergency Medicine | Admitting: Emergency Medicine

## 2018-10-31 DIAGNOSIS — D649 Anemia, unspecified: Secondary | ICD-10-CM | POA: Diagnosis not present

## 2018-10-31 DIAGNOSIS — Z794 Long term (current) use of insulin: Secondary | ICD-10-CM | POA: Diagnosis not present

## 2018-10-31 DIAGNOSIS — E039 Hypothyroidism, unspecified: Secondary | ICD-10-CM | POA: Diagnosis not present

## 2018-10-31 DIAGNOSIS — N185 Chronic kidney disease, stage 5: Secondary | ICD-10-CM | POA: Diagnosis not present

## 2018-10-31 DIAGNOSIS — Z7982 Long term (current) use of aspirin: Secondary | ICD-10-CM | POA: Insufficient documentation

## 2018-10-31 DIAGNOSIS — Z87891 Personal history of nicotine dependence: Secondary | ICD-10-CM | POA: Diagnosis not present

## 2018-10-31 DIAGNOSIS — E1122 Type 2 diabetes mellitus with diabetic chronic kidney disease: Secondary | ICD-10-CM | POA: Diagnosis not present

## 2018-10-31 DIAGNOSIS — I132 Hypertensive heart and chronic kidney disease with heart failure and with stage 5 chronic kidney disease, or end stage renal disease: Secondary | ICD-10-CM | POA: Insufficient documentation

## 2018-10-31 DIAGNOSIS — I10 Essential (primary) hypertension: Secondary | ICD-10-CM | POA: Diagnosis not present

## 2018-10-31 DIAGNOSIS — E1140 Type 2 diabetes mellitus with diabetic neuropathy, unspecified: Secondary | ICD-10-CM | POA: Insufficient documentation

## 2018-10-31 DIAGNOSIS — I5042 Chronic combined systolic (congestive) and diastolic (congestive) heart failure: Secondary | ICD-10-CM | POA: Diagnosis not present

## 2018-10-31 DIAGNOSIS — E1159 Type 2 diabetes mellitus with other circulatory complications: Secondary | ICD-10-CM | POA: Diagnosis not present

## 2018-10-31 DIAGNOSIS — R531 Weakness: Secondary | ICD-10-CM | POA: Diagnosis not present

## 2018-10-31 DIAGNOSIS — N186 End stage renal disease: Secondary | ICD-10-CM | POA: Diagnosis not present

## 2018-10-31 DIAGNOSIS — Z20828 Contact with and (suspected) exposure to other viral communicable diseases: Secondary | ICD-10-CM | POA: Diagnosis not present

## 2018-10-31 DIAGNOSIS — Z79899 Other long term (current) drug therapy: Secondary | ICD-10-CM | POA: Insufficient documentation

## 2018-10-31 DIAGNOSIS — Z992 Dependence on renal dialysis: Secondary | ICD-10-CM | POA: Diagnosis not present

## 2018-10-31 LAB — CBC WITH DIFFERENTIAL/PLATELET
Abs Immature Granulocytes: 0.06 10*3/uL (ref 0.00–0.07)
Basophils Absolute: 0 10*3/uL (ref 0.0–0.1)
Basophils Relative: 0 %
Eosinophils Absolute: 0.2 10*3/uL (ref 0.0–0.5)
Eosinophils Relative: 2 %
HCT: 17.4 % — ABNORMAL LOW (ref 36.0–46.0)
Hemoglobin: 5.5 g/dL — ABNORMAL LOW (ref 12.0–15.0)
Immature Granulocytes: 1 %
Lymphocytes Relative: 20 %
Lymphs Abs: 1.6 10*3/uL (ref 0.7–4.0)
MCH: 31.3 pg (ref 26.0–34.0)
MCHC: 31.6 g/dL (ref 30.0–36.0)
MCV: 98.9 fL (ref 80.0–100.0)
Monocytes Absolute: 0.5 10*3/uL (ref 0.1–1.0)
Monocytes Relative: 6 %
Neutro Abs: 5.9 10*3/uL (ref 1.7–7.7)
Neutrophils Relative %: 71 %
Platelets: 125 10*3/uL — ABNORMAL LOW (ref 150–400)
RBC: 1.76 MIL/uL — ABNORMAL LOW (ref 3.87–5.11)
RDW: 13.8 % (ref 11.5–15.5)
WBC: 8.2 10*3/uL (ref 4.0–10.5)
nRBC: 0 % (ref 0.0–0.2)

## 2018-10-31 LAB — BASIC METABOLIC PANEL
Anion gap: 15 (ref 5–15)
BUN: 103 mg/dL — ABNORMAL HIGH (ref 8–23)
CO2: 26 mmol/L (ref 22–32)
Calcium: 8.3 mg/dL — ABNORMAL LOW (ref 8.9–10.3)
Chloride: 99 mmol/L (ref 98–111)
Creatinine, Ser: 15.1 mg/dL — ABNORMAL HIGH (ref 0.44–1.00)
GFR calc Af Amer: 2 mL/min — ABNORMAL LOW (ref >60)
GFR calc non Af Amer: 2 mL/min — ABNORMAL LOW (ref >60)
Glucose, Bld: 179 mg/dL — ABNORMAL HIGH (ref 70–99)
Potassium: 5 mmol/L (ref 3.5–5.1)
Sodium: 140 mmol/L (ref 135–145)

## 2018-10-31 LAB — PREPARE RBC (CROSSMATCH)

## 2018-10-31 LAB — GLUCOSE, CAPILLARY
Glucose-Capillary: 101 mg/dL — ABNORMAL HIGH (ref 70–99)
Glucose-Capillary: 99 mg/dL (ref 70–99)

## 2018-10-31 MED ORDER — SODIUM CHLORIDE 0.9 % IV SOLN: 10.0000 mL/h | Freq: Once | INTRAVENOUS | Status: DC

## 2018-10-31 NOTE — ED Provider Notes (Signed)
[facility] Emergency Department Provider Note ____________________________________________   First MD Initiated Contact with Patient 10/31/18 1656     (approximate)  I have reviewed the triage vital signs and the nursing notes.   HISTORY  Chief Complaint Abnormal Lab    HPI Bethany Lee is a 68 y.o. female with PMH as noted below who presents with worsening anemia, gradual onset, with hemoglobin measured at 5.5 on outpatient labs today.  The patient reports associated generalized fatigue and weakness.  She denies shortness of breath or chest pain.  She denies any acute bleeding.  She states she was told by her doctor to come to the hospital for transfusion.  Past Medical History:  Diagnosis Date   Anemia    CHF (congestive heart failure) (HCC)    Chronic kidney disease    peritoneal dialysis   Complication of anesthesia    Coronary artery disease    Diabetes mellitus without complication ([place])    Dialysis patient [facility])    Peritoneal dialysis patient   Dyspnea    doe   Edema    History of recent blood transfusion 01/2017   Hypercholesterolemia    Hypertension    Hypothyroidism    Neuropathy    PONV (postoperative nausea and vomiting)    Shingles    October-November 2017   Steal syndrome as complication of dialysis access [facility])    Tremors of nervous system    intermittent when taking gabapentin    Patient Active Problem List   Diagnosis Date Noted   Abdominal distention 04/07/2018   Hyponatremia 04/07/2018   Dyspnea 04/07/2018   Acidosis 04/07/2018   Steal syndrome as complication of dialysis access [facility])    PD catheter dysfunction ([place]) 04/06/2018   Peritoneal dialysis catheter dysfunction ([place]) 91/50/5697   Complication from renal dialysis device 12/03/2017   Elevated triglycerides with high cholesterol 02/21/2017   Peripheral neuropathy 02/21/2017   GIB (gastrointestinal bleeding) 02/10/2017    Acute blood loss anemia    Essential hypertension 02/02/2017   GI bleeding 01/29/2017   Subclavian arterial stenosis (HCC) 01/29/2017   Acute respiratory distress    Acute respiratory failure ([place]) 09/21/2016   S/P drug eluting coronary stent placement 09/14/2016   CAD (coronary artery disease) 09/07/2016   Acute combined systolic and diastolic congestive heart failure ([place]) 08/17/2016   Acute pulmonary edema ([place]) 07/16/2016   Hypothyroidism (acquired) 06/08/2016   ESRD on dialysis ([place]) 12/21/2015   Peritoneal dialysis status ([place]) 12/21/2015   Leg swelling 12/21/2015   Anemia of chronic disease 12/21/2015   Diabetes mellitus ([place]) 12/21/2015   Hyperkalemia 12/21/2015   Myoclonic jerking 12/19/2015   Anemia in chronic renal disease 10/04/2015   DM type 2 with diabetic peripheral neuropathy ([place]) 09/13/2015   Mixed hyperlipidemia 09/13/2015   Type 2 diabetes mellitus with diabetic nephropathy, with long-term current use of insulin ([place]) 09/13/2015   CKD (chronic kidney disease) stage 5, GFR less than 15 ml/min ([place]) 07/09/2015   Proteinuria 05/25/2015    Past Surgical History:  Procedure Laterality Date   A/V FISTULAGRAM Left 01/16/2017   Procedure: A/V Fistulagram;  Surgeon: Katha Cabal, MD;  Location: [place] [facility];  Service: Cardiovascular;  Laterality: Left;   A/V SHUNT INTERVENTION N/A 01/16/2017   Procedure: A/V Shunt Intervention;  Surgeon: Katha Cabal, MD;  Location: [place] [facility];  Service: Cardiovascular;  Laterality: N/A;   ABDOMINAL HYSTERECTOMY     AV FISTULA PLACEMENT Left 11/30/2016  Procedure: ARTERIOVENOUS (AV) FISTULA CREATION ( EXPLORE FOR CREATION BRACHIOCEPHALIC);  Surgeon: Algernon Huxley, MD;  Location: [facility] ORS;  Service: Vascular;  Laterality: Left;   CAPD INSERTION N/A 11/25/2015   Procedure: LAPAROSCOPIC INSERTION CONTINUOUS AMBULATORY PERITONEAL DIALYSIS  (CAPD) CATHETER;  Surgeon: Algernon Huxley, MD;   Location: [facility] ORS;  Service: Vascular;  Laterality: N/A;   CAPD INSERTION N/A 01/06/2016   Procedure: LAPAROSCOPIC INSERTION CONTINUOUS AMBULATORY PERITONEAL DIALYSIS  (CAPD) CATHETER REVISION ;  Surgeon: Algernon Huxley, MD;  Location: [facility] ORS;  Service: Vascular;  Laterality: N/A;   CAPD INSERTION N/A 10/11/2016   Procedure: LAPAROSCOPIC INSERTION CONTINUOUS AMBULATORY PERITONEAL DIALYSIS  (CAPD) CATHETER ( REVISION );  Surgeon: Algernon Huxley, MD;  Location: [facility] ORS;  Service: Vascular;  Laterality: N/A;   CARDIAC CATHETERIZATION     CATARACT EXTRACTION W/PHACO Right 05/25/2016   Procedure: CATARACT EXTRACTION PHACO AND INTRAOCULAR LENS PLACEMENT ([place]);  Surgeon: Eulogio Bear, MD;  Location: [facility] ORS;  Service: Ophthalmology;  Laterality: Right;  Lot # [id number] H [place]: 00:5.2 AP%: 11.3 CDE: 7.21   CATARACT EXTRACTION W/PHACO Left 12/06/2017   Procedure: CATARACT EXTRACTION PHACO AND INTRAOCULAR LENS PLACEMENT (IOC);  Surgeon: Eulogio Bear, MD;  Location: [facility] ORS;  Service: Ophthalmology;  Laterality: Left;  [place] 00.23.4 AP% 7.9 CDE 1.83 Fluid Pack Lot # [id number]   COLONOSCOPY WITH PROPOFOL N/A 02/15/2017   Procedure: COLONOSCOPY WITH PROPOFOL;  Surgeon: [name], MD;  Location[facility] ENDOSCOPY;  Service: Gastroenterology;  Laterality: N/A;   CORONARY ANGIOPLASTY     STENT   CORONARY STENT INTERVENTION N/A 09/07/2016   Procedure: Coronary Stent Intervention;  Surgeon: Isaias Cowman, MD;  Location: [place] [facility];  Service: Cardiovascular;  Laterality: N/A;   DIALYSIS/PERMA CATHETER INSERTION N/A 09/21/2016   Procedure: Dialysis/Perma Catheter Insertion;  Surgeon: Algernon Huxley, MD;  Location: [place] [facility];  Service: Cardiovascular;  Laterality: N/A;   DIALYSIS/PERMA CATHETER REMOVAL Right 11/30/2016   Procedure: DIALYSIS/PERMA CATHETER REMOVAL;  Surgeon: Algernon Huxley, MD;  Location: [facility] ORS;  Service: Vascular;  Laterality: Right;   ESOPHAGOGASTRODUODENOSCOPY  (EGD) WITH PROPOFOL N/A 02/15/2017   Procedure: ESOPHAGOGASTRODUODENOSCOPY (EGD) WITH PROPOFOL;  Surgeon: [name], MD;  Location: [facility] END[facility]COPY;  Service: Gastroenterology;  Laterality: N/A;   EYE SURGERY     GIVENS CAPSULE STUDY N/A 02/26/2017   Procedure: GIVENS CAPSULE STUDY;  Surgeon: [name], MD;  Location: [facility] ENDOSCOPY;  Service: Gastroenterology;  Laterality: N/A;   INSERTION OF DIALYSIS CATHETER N/A 04/07/2018   Procedure: INSERTION OF DIALYSIS CATHETER;  Surgeon: Rosetta Posner, MD;  [facility];  Service: Vascular;  Laterality: N/A;   LEFT HEART CATH AND CORONARY ANGIOGRAPHY Left 09/07/2016   Procedure: Left Heart Cath and Coronary Angiography;  Surgeon: Isaias Cowman, MD;  Location: [place] [facility];  Service: Cardiovascular;  Laterality: Left;   LIGATION OF ARTERIOVENOUS  FISTULA Left 02/07/2017   Procedure: LIGATION OF ARTERIOVENOUS  FISTULA ( BANDING BRACHIAL CEPHALIC );  Surgeon: Katha Cabal, MD;  Location: [facility] ORS;  Service: Vascular;  Laterality: Left;   TUBAL LIGATION     UPPER EXTREMITY ANGIOGRAPHY Left 01/19/2017   Procedure: Upper Extremity Angiography;  Surgeon: Katha Cabal, MD;  Location: [place] [facility];  Service: Cardiovascular;  Laterality: Left;    Prior to Admission medications   Medication Sig Start Date End Date Taking? Authorizing Provider  aspirin EC 81 MG tablet Take 1 tablet (81 mg total) by mouth daily. Start taking after  NOV 15 Patient taking differently: Take 81 mg by mouth daily.  05/26/15   Murlean Iba, MD  calcium acetate (PHOSLO) 667 MG capsule Take 2,001 mg by mouth 3 (three) times daily with meals.    [provider]  carvedilol (COREG) 12.5 MG tablet Take 1 tablet (12.5 mg total) by mouth 2 (two) times daily with a meal. Patient taking differently: Take 25 mg by mouth 2 (two) times daily with a meal.  07/20/16   Fritzi Mandes, MD  hydrALAZINE (APRESOLINE) 50 MG tablet Take 50 mg by mouth 2 (two)  times daily.    [provider]  HYDROcodone-acetaminophen (NORCO/VICODIN) 5-325 MG tablet Take 1 tablet by mouth every 4 (four) hours as needed for moderate pain (for 5 days).    [provider]  insulin aspart (NOVOLOG FLEXPEN) 100 UNIT/ML FlexPen Inject 4-8 Units into the skin 3 (three) times daily with meals. Sliding scale  Below 200 take 4 units 200-250=6 units  >250= 8 units 09/13/15   [provider]  Insulin Glargine (TOUJEO SOLOSTAR) 300 UNIT/ML SOPN Inject 20 Units into the skin at bedtime. Patient taking differently: Inject 24 Units into the skin at bedtime.  12/21/15   Theodoro Grist, MD  iron polysaccharides (NIFEREX) 150 MG capsule Take 150 mg by mouth daily.    [provider]  levothyroxine (SYNTHROID, LEVOTHROID) 112 MCG tablet Take 112 mcg by mouth daily before breakfast.     [provider]  lovastatin (MEVACOR) 40 MG tablet Take 40 mg by mouth at bedtime.    [provider]  Multiple Vitamins-Minerals (DIALYVITE 800/ULTRA D) TABS Take 1 tablet by mouth daily.    [provider]  pantoprazole (PROTONIX) 40 MG tablet Take 1 tablet (40 mg total) by mouth 2 (two) times daily. Patient taking differently: Take 40 mg by mouth daily.  01/31/17   Max Sane, MD  sevelamer carbonate (RENVELA) 800 MG tablet Take 1,600 mg by mouth 3 (three) times daily with meals.    [provider]  urea (CARMOL) 20 % cream Apply 1 application topically daily as needed (for dry skin).    [provider]    Allergies Valacyclovir  Family History  Problem Relation Age of Onset   Diabetes Mother     Social History Social History   Tobacco Use   Smoking status: Former Smoker    Last attempt to quit: 10/09/2008    Years since quitting: 10.0   Smokeless tobacco: Never Used  Substance Use Topics   Alcohol use: No   Drug use: No    Review of Systems  Constitutional: No fever.  Positive for fatigue. Eyes: No  visual changes. ENT: No sore throat. Cardiovascular: Denies chest pain. Respiratory: Denies shortness of breath. Gastrointestinal: No vomiting or diarrhea.  No blood in the stool or dark tarry stools. Genitourinary: Negative for dysuria or hematuria.  Musculoskeletal: Negative for back pain. Skin: Negative for rash. Neurological: Negative for headache.   ____________________________________________   PHYSICAL EXAM:  VITAL SIGNS: ED Triage Vitals  Enc Vitals Group     BP 10/31/18 1638 (!) 120/43     Pulse Rate 10/31/18 1638 66     Resp 10/31/18 1638 20     Temp 10/31/18 1638 98.3 F (36.8 C)     Temp Source 10/31/18 1638 Oral     SpO2 10/31/18 1638 99 %     Weight 10/31/18 1640 141 lb 1.5 oz (64 kg)     Height 10/31/18 1640 5' (  1.524 m)     Head Circumference --      Peak Flow --      Pain Score 10/31/18 1640 0     Pain Loc --      Pain Edu? --      Excl. in [place]? --     Constitutional: Alert and oriented.  Relatively well appearing and in no acute distress. Eyes: Conjunctivae are pale.  Head: Atraumatic. Nose: No congestion/rhinnorhea. Mouth/Throat: Mucous membranes are moist.   Neck: Normal range of motion.  Cardiovascular: Normal rate, regular rhythm. Good peripheral circulation. Respiratory: Normal respiratory effort.  No retractions. Gastrointestinal: No distention.  Musculoskeletal: Extremities warm and well perfused.  Neurologic:  Normal speech and language. No gross focal neurologic deficits are appreciated.  Skin:  Skin is warm and dry. No rash noted. Psychiatric: Mood and affect are normal. Speech and behavior are normal.  ____________________________________________   LABS (all labs ordered are listed, but only abnormal results are displayed)  Labs Reviewed  BASIC METABOLIC PANEL - Abnormal; Notable for the following components:      Result Value   Glucose, Bld 179 (*)    BUN 103 (*)    Creatinine, Ser 15.10 (*)    Calcium 8.3 (*)    GFR calc non  Af Amer 2 (*)    GFR calc Af Amer 2 (*)    All other components within normal limits  CBC WITH DIFFERENTIAL/PLATELET - Abnormal; Notable for the following components:   RBC 1.76 (*)    Hemoglobin 5.5 (*)    HCT 17.4 (*)    Platelets 125 (*)    All other components within normal limits  GLUCOSE, CAPILLARY - Abnormal; Notable for the following components:   Glucose-Capillary 101 (*)    All other components within normal limits  CBC WITH DIFFERENTIAL/PLATELET  TYPE AND SCREEN  PREPARE RBC (CROSSMATCH)   ____________________________________________  EKG   ____________________________________________  RADIOLOGY    ____________________________________________   PROCEDURES  Procedure(s) performed: No  Procedures  Critical Care performed: No ____________________________________________   INITIAL IMPRESSION / ASSESSMENT AND PLAN / ED COURSE  Pertinent labs & imaging results that were available during my care of the patient were reviewed by me and considered in my medical decision making (see chart for details).  68 year old female with a history of ESRD and other PMH as noted above presents with worsening anemia with hemoglobin measured to 5.5 on outpatient labs today.  I reviewed the past medical records in [place].  Most recent prior labs from January show hemoglobin of 12 although the patient does have a history of anemia with hemoglobin as low as 6 two years ago.  On exam, the patient is relatively well-appearing.  Her vital signs are normal.  The remainder of the exam is unremarkable.  We will repeat labs to confirm the low hemoglobin.  Patient appears to have a normocytic anemia, likely related to ESRD.  Will plan for transfusion of 2 units of RBCs.  The patient expresses a strong preference to go home if at all possible.  Given the patient's normal vital signs and especially in the current context of the COVID-19 pandemic I would like to avoid any unnecessary admission so  we will plan to transfuse the patient in the ED, recheck the hemoglobin and observe her.  If it responds appropriately anticipate possible discharge home.  ----------------------------------------- 11:01 PM on 10/31/2018 -----------------------------------------  The patient has received a transfusion of 2 units of PRBCs.  Her vital signs  remained stable and she is feeling comfortable.  She wants to go home.  We will monitor her for 1 hour and then check a repeat CBC.  If it has improved appropriately, we will discharge patient home per her wishes.  I am signing the patient out to the oncoming physician Dr. Beather Arbour.  ______  Bethany Lee was evaluated in Emergency Department on 10/31/2018 for the symptoms described in the history of present illness. She was evaluated in the context of the global COVID-19 pandemic, which necessitated consideration that the patient might be at risk for infection with the SARS-CoV-2 virus that causes COVID-19. Institutional protocols and algorithms that pertain to the evaluation of patients at risk for COVID-19 are in a state of rapid change based on information released by regulatory bodies including the CDC and federal and state organizations. These policies and algorithms were followed during the patient's care in the ED.  ____________________________________________   FINAL CLINICAL IMPRESSION(S) / ED DIAGNOSES  Final diagnoses:  Symptomatic anemia      NEW MEDICATIONS STARTED DURING THIS VISIT:  New Prescriptions   No medications on file     Note:  This document was prepared using Dragon voice recognition software and may include unintentional dictation errors.    Arta Silence, MD 10/31/18 2302

## 2018-10-31 NOTE — Discharge Instructions (Addendum)
You received 2 units of blood in the emergency department.  This has improved your hemoglobin/hematocrit from 5.5/17.4 to 8.2/24.4. Call Dr. Ginette Pitman and/or Dr. Juleen China tomorrow to discuss follow-up.   Return to the ER for new or worsening weakness, fatigue, or any shortness of breath, chest pain, or other new or worsening symptoms that concern you.

## 2018-10-31 NOTE — ED Notes (Signed)
Blood Consent signed and on chart.

## 2018-10-31 NOTE — ED Triage Notes (Signed)
Pt sent to ER for abnormal hgb.  Pt feeling weak for 2 days.  States sx worse today.  Pt denies any pain.  Pt alert  Speech clear.

## 2018-10-31 NOTE — ED Notes (Signed)
Pt reports her primary doctor drew her blood this morning and then contacted her to tell her that her hgb was 5.5. They advised her to go to the ER.

## 2018-11-01 ENCOUNTER — Ambulatory Visit
Admission: RE | Admit: 2018-11-01 | Discharge: 2018-11-01 | Disposition: A | Discharge status: Home / Self Care | Payer: Medicare HMO | Source: Ambulatory Visit | Attending: Nephrology | Admitting: Nephrology

## 2018-11-01 DIAGNOSIS — N186 End stage renal disease: Secondary | ICD-10-CM | POA: Diagnosis not present

## 2018-11-01 DIAGNOSIS — Z992 Dependence on renal dialysis: Secondary | ICD-10-CM | POA: Diagnosis not present

## 2018-11-01 LAB — CBC WITH DIFFERENTIAL/PLATELET
Abs Immature Granulocytes: 0.08 10*3/uL — ABNORMAL HIGH (ref 0.00–0.07)
Basophils Absolute: 0 10*3/uL (ref 0.0–0.1)
Basophils Relative: 0 %
Eosinophils Absolute: 0.2 10*3/uL (ref 0.0–0.5)
Eosinophils Relative: 2 %
HCT: 24.4 % — ABNORMAL LOW (ref 36.0–46.0)
Hemoglobin: 8.2 g/dL — ABNORMAL LOW (ref 12.0–15.0)
Immature Granulocytes: 1 %
Lymphocytes Relative: 18 %
Lymphs Abs: 1.7 10*3/uL (ref 0.7–4.0)
MCH: 31.4 pg (ref 26.0–34.0)
MCHC: 33.6 g/dL (ref 30.0–36.0)
MCV: 93.5 fL (ref 80.0–100.0)
Monocytes Absolute: 0.6 10*3/uL (ref 0.1–1.0)
Monocytes Relative: 6 %
Neutro Abs: 7.1 10*3/uL (ref 1.7–7.7)
Neutrophils Relative %: 73 %
Platelets: 118 10*3/uL — ABNORMAL LOW (ref 150–400)
RBC: 2.61 MIL/uL — ABNORMAL LOW (ref 3.87–5.11)
RDW: 14.2 % (ref 11.5–15.5)
WBC: 9.7 10*3/uL (ref 4.0–10.5)
nRBC: 0.2 % (ref 0.0–0.2)

## 2018-11-01 LAB — BPAM RBC
Blood Product Expiration Date: 202004152359
Blood Product Expiration Date: 202004152359
ISSUE DATE / TIME: 202004091944
ISSUE DATE / TIME: 202004092124
Unit Type and Rh: 600
Unit Type and Rh: 600

## 2018-11-01 LAB — TYPE AND SCREEN
ABO/RH(D): A POS
Antibody Screen: NEGATIVE
Unit division: 0
Unit division: 0

## 2018-11-01 NOTE — ED Provider Notes (Signed)
-----------------------------------------   12:42 AM on 11/01/2018 -----------------------------------------  Repeat H/H has improved to 8.2/24.4.  Patient still desires to go home.  Remains hemodynamically stable without tachycardia or hypotension.  She will follow-up closely with her PCP.  Strict return precautions given.  Patient verbalizes understanding agrees with plan of care.   Paulette Blanch, MD 11/01/18 [phone]

## 2018-11-02 DIAGNOSIS — N186 End stage renal disease: Secondary | ICD-10-CM | POA: Diagnosis not present

## 2018-11-02 DIAGNOSIS — Z992 Dependence on renal dialysis: Secondary | ICD-10-CM | POA: Diagnosis not present

## 2018-11-03 DIAGNOSIS — N186 End stage renal disease: Secondary | ICD-10-CM | POA: Diagnosis not present

## 2018-11-03 DIAGNOSIS — Z992 Dependence on renal dialysis: Secondary | ICD-10-CM | POA: Diagnosis not present

## 2018-11-04 DIAGNOSIS — Z992 Dependence on renal dialysis: Secondary | ICD-10-CM | POA: Diagnosis not present

## 2018-11-04 DIAGNOSIS — N186 End stage renal disease: Secondary | ICD-10-CM | POA: Diagnosis not present

## 2018-11-04 DIAGNOSIS — Z01818 Encounter for other preprocedural examination: Secondary | ICD-10-CM

## 2018-11-04 DIAGNOSIS — Z7682 Awaiting organ transplant status: Principal | ICD-10-CM

## 2018-11-05 DIAGNOSIS — Z992 Dependence on renal dialysis: Secondary | ICD-10-CM | POA: Diagnosis not present

## 2018-11-05 DIAGNOSIS — N186 End stage renal disease: Secondary | ICD-10-CM | POA: Diagnosis not present

## 2018-11-06 DIAGNOSIS — N186 End stage renal disease: Secondary | ICD-10-CM | POA: Diagnosis not present

## 2018-11-06 DIAGNOSIS — Z992 Dependence on renal dialysis: Secondary | ICD-10-CM | POA: Diagnosis not present

## 2018-11-07 DIAGNOSIS — Z992 Dependence on renal dialysis: Secondary | ICD-10-CM | POA: Diagnosis not present

## 2018-11-07 DIAGNOSIS — N186 End stage renal disease: Secondary | ICD-10-CM | POA: Diagnosis not present

## 2018-11-08 DIAGNOSIS — N186 End stage renal disease: Secondary | ICD-10-CM | POA: Diagnosis not present

## 2018-11-08 DIAGNOSIS — Z992 Dependence on renal dialysis: Secondary | ICD-10-CM | POA: Diagnosis not present

## 2018-11-09 DIAGNOSIS — N186 End stage renal disease: Secondary | ICD-10-CM | POA: Diagnosis not present

## 2018-11-09 DIAGNOSIS — Z992 Dependence on renal dialysis: Secondary | ICD-10-CM | POA: Diagnosis not present

## 2018-11-10 DIAGNOSIS — N186 End stage renal disease: Secondary | ICD-10-CM | POA: Diagnosis not present

## 2018-11-10 DIAGNOSIS — Z992 Dependence on renal dialysis: Secondary | ICD-10-CM | POA: Diagnosis not present

## 2018-11-11 DIAGNOSIS — Z992 Dependence on renal dialysis: Secondary | ICD-10-CM | POA: Diagnosis not present

## 2018-11-11 DIAGNOSIS — N186 End stage renal disease: Secondary | ICD-10-CM | POA: Diagnosis not present

## 2018-11-12 DIAGNOSIS — Z992 Dependence on renal dialysis: Secondary | ICD-10-CM | POA: Diagnosis not present

## 2018-11-12 DIAGNOSIS — N186 End stage renal disease: Secondary | ICD-10-CM | POA: Diagnosis not present

## 2018-11-13 DIAGNOSIS — N186 End stage renal disease: Secondary | ICD-10-CM | POA: Diagnosis not present

## 2018-11-13 DIAGNOSIS — Z992 Dependence on renal dialysis: Secondary | ICD-10-CM | POA: Diagnosis not present

## 2018-11-14 DIAGNOSIS — Z992 Dependence on renal dialysis: Secondary | ICD-10-CM | POA: Diagnosis not present

## 2018-11-14 DIAGNOSIS — N186 End stage renal disease: Secondary | ICD-10-CM | POA: Diagnosis not present

## 2018-11-15 DIAGNOSIS — Z992 Dependence on renal dialysis: Secondary | ICD-10-CM | POA: Diagnosis not present

## 2018-11-15 DIAGNOSIS — N186 End stage renal disease: Secondary | ICD-10-CM | POA: Diagnosis not present

## 2018-11-16 DIAGNOSIS — N186 End stage renal disease: Secondary | ICD-10-CM | POA: Diagnosis not present

## 2018-11-16 DIAGNOSIS — Z992 Dependence on renal dialysis: Secondary | ICD-10-CM | POA: Diagnosis not present

## 2018-11-17 DIAGNOSIS — N186 End stage renal disease: Secondary | ICD-10-CM | POA: Diagnosis not present

## 2018-11-17 DIAGNOSIS — Z992 Dependence on renal dialysis: Secondary | ICD-10-CM | POA: Diagnosis not present

## 2018-11-18 DIAGNOSIS — N186 End stage renal disease: Secondary | ICD-10-CM | POA: Diagnosis not present

## 2018-11-18 DIAGNOSIS — Z992 Dependence on renal dialysis: Secondary | ICD-10-CM | POA: Diagnosis not present

## 2018-11-19 DIAGNOSIS — Z992 Dependence on renal dialysis: Secondary | ICD-10-CM | POA: Diagnosis not present

## 2018-11-19 DIAGNOSIS — N186 End stage renal disease: Secondary | ICD-10-CM | POA: Diagnosis not present

## 2018-11-20 DIAGNOSIS — N186 End stage renal disease: Secondary | ICD-10-CM | POA: Diagnosis not present

## 2018-11-20 DIAGNOSIS — Z992 Dependence on renal dialysis: Secondary | ICD-10-CM | POA: Diagnosis not present

## 2018-11-21 DIAGNOSIS — N186 End stage renal disease: Secondary | ICD-10-CM | POA: Diagnosis not present

## 2018-11-21 DIAGNOSIS — Z992 Dependence on renal dialysis: Secondary | ICD-10-CM | POA: Diagnosis not present

## 2018-11-22 DIAGNOSIS — N186 End stage renal disease: Secondary | ICD-10-CM | POA: Diagnosis not present

## 2018-11-22 DIAGNOSIS — Z992 Dependence on renal dialysis: Secondary | ICD-10-CM | POA: Diagnosis not present

## 2018-11-23 DIAGNOSIS — Z992 Dependence on renal dialysis: Secondary | ICD-10-CM | POA: Diagnosis not present

## 2018-11-23 DIAGNOSIS — N186 End stage renal disease: Secondary | ICD-10-CM | POA: Diagnosis not present

## 2018-11-24 DIAGNOSIS — N186 End stage renal disease: Secondary | ICD-10-CM | POA: Diagnosis not present

## 2018-11-24 DIAGNOSIS — Z992 Dependence on renal dialysis: Secondary | ICD-10-CM | POA: Diagnosis not present

## 2018-11-25 DIAGNOSIS — Z992 Dependence on renal dialysis: Secondary | ICD-10-CM | POA: Diagnosis not present

## 2018-11-25 DIAGNOSIS — N186 End stage renal disease: Secondary | ICD-10-CM | POA: Diagnosis not present

## 2018-11-26 ENCOUNTER — Encounter: Admit: 2018-11-26 | Discharge: 2018-11-26 | Payer: MEDICARE | Attending: Internal Medicine | Primary: Internal Medicine

## 2018-11-26 DIAGNOSIS — Z992 Dependence on renal dialysis: Secondary | ICD-10-CM | POA: Diagnosis not present

## 2018-11-26 DIAGNOSIS — N186 End stage renal disease: Secondary | ICD-10-CM | POA: Diagnosis not present

## 2018-11-27 DIAGNOSIS — N186 End stage renal disease: Secondary | ICD-10-CM | POA: Diagnosis not present

## 2018-11-27 DIAGNOSIS — Z992 Dependence on renal dialysis: Secondary | ICD-10-CM | POA: Diagnosis not present

## 2018-11-28 DIAGNOSIS — N186 End stage renal disease: Secondary | ICD-10-CM | POA: Diagnosis not present

## 2018-11-28 DIAGNOSIS — Z992 Dependence on renal dialysis: Secondary | ICD-10-CM | POA: Diagnosis not present

## 2018-11-29 DIAGNOSIS — Z992 Dependence on renal dialysis: Secondary | ICD-10-CM | POA: Diagnosis not present

## 2018-11-29 DIAGNOSIS — N186 End stage renal disease: Secondary | ICD-10-CM | POA: Diagnosis not present

## 2018-11-30 DIAGNOSIS — N186 End stage renal disease: Secondary | ICD-10-CM | POA: Diagnosis not present

## 2018-11-30 DIAGNOSIS — Z992 Dependence on renal dialysis: Secondary | ICD-10-CM | POA: Diagnosis not present

## 2018-12-01 DIAGNOSIS — Z992 Dependence on renal dialysis: Secondary | ICD-10-CM | POA: Diagnosis not present

## 2018-12-01 DIAGNOSIS — N186 End stage renal disease: Secondary | ICD-10-CM | POA: Diagnosis not present

## 2018-12-02 DIAGNOSIS — N186 End stage renal disease: Secondary | ICD-10-CM | POA: Diagnosis not present

## 2018-12-02 DIAGNOSIS — Z992 Dependence on renal dialysis: Secondary | ICD-10-CM | POA: Diagnosis not present

## 2018-12-03 DIAGNOSIS — Z992 Dependence on renal dialysis: Secondary | ICD-10-CM | POA: Diagnosis not present

## 2018-12-03 DIAGNOSIS — N186 End stage renal disease: Secondary | ICD-10-CM | POA: Diagnosis not present

## 2018-12-04 DIAGNOSIS — Z992 Dependence on renal dialysis: Secondary | ICD-10-CM | POA: Diagnosis not present

## 2018-12-04 DIAGNOSIS — N186 End stage renal disease: Secondary | ICD-10-CM | POA: Diagnosis not present

## 2018-12-04 DIAGNOSIS — Z01818 Encounter for other preprocedural examination: Secondary | ICD-10-CM

## 2018-12-04 DIAGNOSIS — Z7682 Awaiting organ transplant status: Principal | ICD-10-CM

## 2018-12-05 DIAGNOSIS — N186 End stage renal disease: Secondary | ICD-10-CM | POA: Diagnosis not present

## 2018-12-05 DIAGNOSIS — Z992 Dependence on renal dialysis: Secondary | ICD-10-CM | POA: Diagnosis not present

## 2018-12-06 DIAGNOSIS — Z992 Dependence on renal dialysis: Secondary | ICD-10-CM | POA: Diagnosis not present

## 2018-12-06 DIAGNOSIS — N186 End stage renal disease: Secondary | ICD-10-CM | POA: Diagnosis not present

## 2018-12-07 DIAGNOSIS — N186 End stage renal disease: Secondary | ICD-10-CM | POA: Diagnosis not present

## 2018-12-07 DIAGNOSIS — Z992 Dependence on renal dialysis: Secondary | ICD-10-CM | POA: Diagnosis not present

## 2018-12-08 DIAGNOSIS — Z992 Dependence on renal dialysis: Secondary | ICD-10-CM | POA: Diagnosis not present

## 2018-12-08 DIAGNOSIS — N186 End stage renal disease: Secondary | ICD-10-CM | POA: Diagnosis not present

## 2018-12-09 ENCOUNTER — Encounter (INDEPENDENT_AMBULATORY_CARE_PROVIDER_SITE_OTHER): Payer: Medicare HMO

## 2018-12-09 ENCOUNTER — Other Ambulatory Visit (INDEPENDENT_AMBULATORY_CARE_PROVIDER_SITE_OTHER): Payer: Self-pay | Admitting: Vascular Surgery

## 2018-12-09 ENCOUNTER — Ambulatory Visit (INDEPENDENT_AMBULATORY_CARE_PROVIDER_SITE_OTHER): Payer: Medicare HMO | Admitting: Vascular Surgery

## 2018-12-09 DIAGNOSIS — N186 End stage renal disease: Secondary | ICD-10-CM | POA: Diagnosis not present

## 2018-12-09 DIAGNOSIS — Z992 Dependence on renal dialysis: Secondary | ICD-10-CM | POA: Diagnosis not present

## 2018-12-10 DIAGNOSIS — Z992 Dependence on renal dialysis: Secondary | ICD-10-CM | POA: Diagnosis not present

## 2018-12-10 DIAGNOSIS — N186 End stage renal disease: Secondary | ICD-10-CM | POA: Diagnosis not present

## 2018-12-11 DIAGNOSIS — N186 End stage renal disease: Secondary | ICD-10-CM | POA: Diagnosis not present

## 2018-12-11 DIAGNOSIS — Z992 Dependence on renal dialysis: Secondary | ICD-10-CM | POA: Diagnosis not present

## 2018-12-12 DIAGNOSIS — Z992 Dependence on renal dialysis: Secondary | ICD-10-CM | POA: Diagnosis not present

## 2018-12-12 DIAGNOSIS — N186 End stage renal disease: Secondary | ICD-10-CM | POA: Diagnosis not present

## 2018-12-13 DIAGNOSIS — N186 End stage renal disease: Secondary | ICD-10-CM | POA: Diagnosis not present

## 2018-12-13 DIAGNOSIS — Z992 Dependence on renal dialysis: Secondary | ICD-10-CM | POA: Diagnosis not present

## 2018-12-14 DIAGNOSIS — Z992 Dependence on renal dialysis: Secondary | ICD-10-CM | POA: Diagnosis not present

## 2018-12-14 DIAGNOSIS — N186 End stage renal disease: Secondary | ICD-10-CM | POA: Diagnosis not present

## 2018-12-15 DIAGNOSIS — N186 End stage renal disease: Secondary | ICD-10-CM | POA: Diagnosis not present

## 2018-12-15 DIAGNOSIS — Z992 Dependence on renal dialysis: Secondary | ICD-10-CM | POA: Diagnosis not present

## 2018-12-16 DIAGNOSIS — N186 End stage renal disease: Secondary | ICD-10-CM | POA: Diagnosis not present

## 2018-12-16 DIAGNOSIS — Z992 Dependence on renal dialysis: Secondary | ICD-10-CM | POA: Diagnosis not present

## 2018-12-17 DIAGNOSIS — N186 End stage renal disease: Secondary | ICD-10-CM | POA: Diagnosis not present

## 2018-12-17 DIAGNOSIS — Z992 Dependence on renal dialysis: Secondary | ICD-10-CM | POA: Diagnosis not present

## 2018-12-18 DIAGNOSIS — N186 End stage renal disease: Secondary | ICD-10-CM | POA: Diagnosis not present

## 2018-12-18 DIAGNOSIS — Z992 Dependence on renal dialysis: Secondary | ICD-10-CM | POA: Diagnosis not present

## 2018-12-19 DIAGNOSIS — Z992 Dependence on renal dialysis: Secondary | ICD-10-CM | POA: Diagnosis not present

## 2018-12-19 DIAGNOSIS — N186 End stage renal disease: Secondary | ICD-10-CM | POA: Diagnosis not present

## 2018-12-20 DIAGNOSIS — Z992 Dependence on renal dialysis: Secondary | ICD-10-CM | POA: Diagnosis not present

## 2018-12-20 DIAGNOSIS — N186 End stage renal disease: Secondary | ICD-10-CM | POA: Diagnosis not present

## 2018-12-21 DIAGNOSIS — N186 End stage renal disease: Secondary | ICD-10-CM | POA: Diagnosis not present

## 2018-12-21 DIAGNOSIS — Z992 Dependence on renal dialysis: Secondary | ICD-10-CM | POA: Diagnosis not present

## 2018-12-22 DIAGNOSIS — Z992 Dependence on renal dialysis: Secondary | ICD-10-CM | POA: Diagnosis not present

## 2018-12-22 DIAGNOSIS — N186 End stage renal disease: Secondary | ICD-10-CM | POA: Diagnosis not present

## 2018-12-23 DIAGNOSIS — N186 End stage renal disease: Secondary | ICD-10-CM | POA: Diagnosis not present

## 2018-12-23 DIAGNOSIS — Z992 Dependence on renal dialysis: Secondary | ICD-10-CM | POA: Diagnosis not present

## 2018-12-24 DIAGNOSIS — N186 End stage renal disease: Secondary | ICD-10-CM | POA: Diagnosis not present

## 2018-12-24 DIAGNOSIS — Z992 Dependence on renal dialysis: Secondary | ICD-10-CM | POA: Diagnosis not present

## 2018-12-25 DIAGNOSIS — Z992 Dependence on renal dialysis: Secondary | ICD-10-CM | POA: Diagnosis not present

## 2018-12-25 DIAGNOSIS — N186 End stage renal disease: Secondary | ICD-10-CM | POA: Diagnosis not present

## 2018-12-26 DIAGNOSIS — N186 End stage renal disease: Secondary | ICD-10-CM | POA: Diagnosis not present

## 2018-12-26 DIAGNOSIS — Z992 Dependence on renal dialysis: Secondary | ICD-10-CM | POA: Diagnosis not present

## 2018-12-27 DIAGNOSIS — N186 End stage renal disease: Secondary | ICD-10-CM | POA: Diagnosis not present

## 2018-12-27 DIAGNOSIS — Z992 Dependence on renal dialysis: Secondary | ICD-10-CM | POA: Diagnosis not present

## 2018-12-28 DIAGNOSIS — Z992 Dependence on renal dialysis: Secondary | ICD-10-CM | POA: Diagnosis not present

## 2018-12-28 DIAGNOSIS — N186 End stage renal disease: Secondary | ICD-10-CM | POA: Diagnosis not present

## 2018-12-29 DIAGNOSIS — Z992 Dependence on renal dialysis: Secondary | ICD-10-CM | POA: Diagnosis not present

## 2018-12-29 DIAGNOSIS — N186 End stage renal disease: Secondary | ICD-10-CM | POA: Diagnosis not present

## 2018-12-30 DIAGNOSIS — Z992 Dependence on renal dialysis: Secondary | ICD-10-CM | POA: Diagnosis not present

## 2018-12-30 DIAGNOSIS — N186 End stage renal disease: Secondary | ICD-10-CM | POA: Diagnosis not present

## 2018-12-31 DIAGNOSIS — Z992 Dependence on renal dialysis: Secondary | ICD-10-CM | POA: Diagnosis not present

## 2018-12-31 DIAGNOSIS — N186 End stage renal disease: Secondary | ICD-10-CM | POA: Diagnosis not present

## 2019-01-01 DIAGNOSIS — N186 End stage renal disease: Secondary | ICD-10-CM | POA: Diagnosis not present

## 2019-01-01 DIAGNOSIS — Z992 Dependence on renal dialysis: Secondary | ICD-10-CM | POA: Diagnosis not present

## 2019-01-02 DIAGNOSIS — Z992 Dependence on renal dialysis: Secondary | ICD-10-CM | POA: Diagnosis not present

## 2019-01-02 DIAGNOSIS — N186 End stage renal disease: Secondary | ICD-10-CM | POA: Diagnosis not present

## 2019-01-03 DIAGNOSIS — Z992 Dependence on renal dialysis: Secondary | ICD-10-CM | POA: Diagnosis not present

## 2019-01-03 DIAGNOSIS — N186 End stage renal disease: Secondary | ICD-10-CM | POA: Diagnosis not present

## 2019-01-04 DIAGNOSIS — N186 End stage renal disease: Secondary | ICD-10-CM | POA: Diagnosis not present

## 2019-01-04 DIAGNOSIS — Z992 Dependence on renal dialysis: Secondary | ICD-10-CM | POA: Diagnosis not present

## 2019-01-05 DIAGNOSIS — N186 End stage renal disease: Secondary | ICD-10-CM | POA: Diagnosis not present

## 2019-01-05 DIAGNOSIS — Z992 Dependence on renal dialysis: Secondary | ICD-10-CM | POA: Diagnosis not present

## 2019-01-06 DIAGNOSIS — Z992 Dependence on renal dialysis: Secondary | ICD-10-CM | POA: Diagnosis not present

## 2019-01-06 DIAGNOSIS — N186 End stage renal disease: Secondary | ICD-10-CM | POA: Diagnosis not present

## 2019-01-07 DIAGNOSIS — N186 End stage renal disease: Secondary | ICD-10-CM | POA: Diagnosis not present

## 2019-01-07 DIAGNOSIS — Z992 Dependence on renal dialysis: Secondary | ICD-10-CM | POA: Diagnosis not present

## 2019-01-08 DIAGNOSIS — Z992 Dependence on renal dialysis: Secondary | ICD-10-CM | POA: Diagnosis not present

## 2019-01-08 DIAGNOSIS — N186 End stage renal disease: Secondary | ICD-10-CM | POA: Diagnosis not present

## 2019-01-08 DIAGNOSIS — E113413 Type 2 diabetes mellitus with severe nonproliferative diabetic retinopathy with macular edema, bilateral: Secondary | ICD-10-CM | POA: Diagnosis not present

## 2019-01-09 DIAGNOSIS — N390 Urinary tract infection, site not specified: Secondary | ICD-10-CM | POA: Diagnosis not present

## 2019-01-09 DIAGNOSIS — M25531 Pain in right wrist: Secondary | ICD-10-CM | POA: Diagnosis not present

## 2019-01-09 DIAGNOSIS — E039 Hypothyroidism, unspecified: Secondary | ICD-10-CM | POA: Diagnosis not present

## 2019-01-09 DIAGNOSIS — Z794 Long term (current) use of insulin: Secondary | ICD-10-CM | POA: Diagnosis not present

## 2019-01-09 DIAGNOSIS — N186 End stage renal disease: Secondary | ICD-10-CM | POA: Diagnosis not present

## 2019-01-09 DIAGNOSIS — Z992 Dependence on renal dialysis: Secondary | ICD-10-CM | POA: Diagnosis not present

## 2019-01-09 DIAGNOSIS — E1122 Type 2 diabetes mellitus with diabetic chronic kidney disease: Secondary | ICD-10-CM | POA: Diagnosis not present

## 2019-01-09 DIAGNOSIS — I10 Essential (primary) hypertension: Secondary | ICD-10-CM | POA: Diagnosis not present

## 2019-01-10 DIAGNOSIS — E1122 Type 2 diabetes mellitus with diabetic chronic kidney disease: Secondary | ICD-10-CM | POA: Diagnosis not present

## 2019-01-10 DIAGNOSIS — N186 End stage renal disease: Secondary | ICD-10-CM | POA: Diagnosis not present

## 2019-01-10 DIAGNOSIS — R899 Unspecified abnormal finding in specimens from other organs, systems and tissues: Secondary | ICD-10-CM | POA: Diagnosis not present

## 2019-01-10 DIAGNOSIS — E039 Hypothyroidism, unspecified: Secondary | ICD-10-CM | POA: Diagnosis not present

## 2019-01-10 DIAGNOSIS — R531 Weakness: Secondary | ICD-10-CM | POA: Diagnosis not present

## 2019-01-10 DIAGNOSIS — N390 Urinary tract infection, site not specified: Secondary | ICD-10-CM | POA: Diagnosis not present

## 2019-01-10 DIAGNOSIS — I10 Essential (primary) hypertension: Secondary | ICD-10-CM | POA: Diagnosis not present

## 2019-01-10 DIAGNOSIS — Z992 Dependence on renal dialysis: Secondary | ICD-10-CM | POA: Diagnosis not present

## 2019-01-10 DIAGNOSIS — M25531 Pain in right wrist: Secondary | ICD-10-CM | POA: Diagnosis not present

## 2019-01-11 DIAGNOSIS — N186 End stage renal disease: Secondary | ICD-10-CM | POA: Diagnosis not present

## 2019-01-11 DIAGNOSIS — Z992 Dependence on renal dialysis: Secondary | ICD-10-CM | POA: Diagnosis not present

## 2019-01-12 DIAGNOSIS — N186 End stage renal disease: Secondary | ICD-10-CM | POA: Diagnosis not present

## 2019-01-12 DIAGNOSIS — Z992 Dependence on renal dialysis: Secondary | ICD-10-CM | POA: Diagnosis not present

## 2019-01-13 DIAGNOSIS — N186 End stage renal disease: Secondary | ICD-10-CM | POA: Diagnosis not present

## 2019-01-13 DIAGNOSIS — Z992 Dependence on renal dialysis: Secondary | ICD-10-CM | POA: Diagnosis not present

## 2019-01-14 DIAGNOSIS — E1159 Type 2 diabetes mellitus with other circulatory complications: Secondary | ICD-10-CM | POA: Diagnosis not present

## 2019-01-14 DIAGNOSIS — E113511 Type 2 diabetes mellitus with proliferative diabetic retinopathy with macular edema, right eye: Secondary | ICD-10-CM | POA: Diagnosis not present

## 2019-01-14 DIAGNOSIS — Z794 Long term (current) use of insulin: Secondary | ICD-10-CM | POA: Diagnosis not present

## 2019-01-14 DIAGNOSIS — E113412 Type 2 diabetes mellitus with severe nonproliferative diabetic retinopathy with macular edema, left eye: Secondary | ICD-10-CM | POA: Diagnosis not present

## 2019-01-14 DIAGNOSIS — E1122 Type 2 diabetes mellitus with diabetic chronic kidney disease: Secondary | ICD-10-CM | POA: Diagnosis not present

## 2019-01-14 DIAGNOSIS — I10 Essential (primary) hypertension: Secondary | ICD-10-CM | POA: Diagnosis not present

## 2019-01-14 DIAGNOSIS — Z992 Dependence on renal dialysis: Secondary | ICD-10-CM | POA: Diagnosis not present

## 2019-01-14 DIAGNOSIS — E1142 Type 2 diabetes mellitus with diabetic polyneuropathy: Secondary | ICD-10-CM | POA: Diagnosis not present

## 2019-01-14 DIAGNOSIS — N186 End stage renal disease: Secondary | ICD-10-CM | POA: Diagnosis not present

## 2019-01-15 DIAGNOSIS — Z992 Dependence on renal dialysis: Secondary | ICD-10-CM | POA: Diagnosis not present

## 2019-01-15 DIAGNOSIS — N186 End stage renal disease: Secondary | ICD-10-CM | POA: Diagnosis not present

## 2019-01-16 DIAGNOSIS — E039 Hypothyroidism, unspecified: Secondary | ICD-10-CM | POA: Diagnosis not present

## 2019-01-16 DIAGNOSIS — N186 End stage renal disease: Secondary | ICD-10-CM | POA: Diagnosis not present

## 2019-01-16 DIAGNOSIS — I10 Essential (primary) hypertension: Secondary | ICD-10-CM | POA: Diagnosis not present

## 2019-01-16 DIAGNOSIS — E1122 Type 2 diabetes mellitus with diabetic chronic kidney disease: Secondary | ICD-10-CM | POA: Diagnosis not present

## 2019-01-16 DIAGNOSIS — Z955 Presence of coronary angioplasty implant and graft: Secondary | ICD-10-CM | POA: Diagnosis not present

## 2019-01-16 DIAGNOSIS — D638 Anemia in other chronic diseases classified elsewhere: Secondary | ICD-10-CM | POA: Diagnosis not present

## 2019-01-16 DIAGNOSIS — Z0000 Encounter for general adult medical examination without abnormal findings: Secondary | ICD-10-CM | POA: Diagnosis not present

## 2019-01-16 DIAGNOSIS — Z992 Dependence on renal dialysis: Secondary | ICD-10-CM | POA: Diagnosis not present

## 2019-01-16 DIAGNOSIS — I2510 Atherosclerotic heart disease of native coronary artery without angina pectoris: Secondary | ICD-10-CM | POA: Diagnosis not present

## 2019-01-17 DIAGNOSIS — N186 End stage renal disease: Secondary | ICD-10-CM | POA: Diagnosis not present

## 2019-01-17 DIAGNOSIS — Z992 Dependence on renal dialysis: Secondary | ICD-10-CM | POA: Diagnosis not present

## 2019-01-18 DIAGNOSIS — N186 End stage renal disease: Secondary | ICD-10-CM | POA: Diagnosis not present

## 2019-01-18 DIAGNOSIS — Z992 Dependence on renal dialysis: Secondary | ICD-10-CM | POA: Diagnosis not present

## 2019-01-19 DIAGNOSIS — N186 End stage renal disease: Secondary | ICD-10-CM | POA: Diagnosis not present

## 2019-01-19 DIAGNOSIS — Z992 Dependence on renal dialysis: Secondary | ICD-10-CM | POA: Diagnosis not present

## 2019-01-20 DIAGNOSIS — Z992 Dependence on renal dialysis: Secondary | ICD-10-CM | POA: Diagnosis not present

## 2019-01-20 DIAGNOSIS — N186 End stage renal disease: Secondary | ICD-10-CM | POA: Diagnosis not present

## 2019-01-21 DIAGNOSIS — N186 End stage renal disease: Secondary | ICD-10-CM | POA: Diagnosis not present

## 2019-01-21 DIAGNOSIS — Z992 Dependence on renal dialysis: Secondary | ICD-10-CM | POA: Diagnosis not present

## 2019-01-22 DIAGNOSIS — N186 End stage renal disease: Secondary | ICD-10-CM | POA: Diagnosis not present

## 2019-01-22 DIAGNOSIS — Z992 Dependence on renal dialysis: Secondary | ICD-10-CM | POA: Diagnosis not present

## 2019-01-23 DIAGNOSIS — Z992 Dependence on renal dialysis: Secondary | ICD-10-CM | POA: Diagnosis not present

## 2019-01-23 DIAGNOSIS — N186 End stage renal disease: Secondary | ICD-10-CM | POA: Diagnosis not present

## 2019-01-24 DIAGNOSIS — N186 End stage renal disease: Secondary | ICD-10-CM | POA: Diagnosis not present

## 2019-01-24 DIAGNOSIS — Z992 Dependence on renal dialysis: Secondary | ICD-10-CM | POA: Diagnosis not present

## 2019-01-25 DIAGNOSIS — N186 End stage renal disease: Secondary | ICD-10-CM | POA: Diagnosis not present

## 2019-01-25 DIAGNOSIS — Z992 Dependence on renal dialysis: Secondary | ICD-10-CM | POA: Diagnosis not present

## 2019-01-26 DIAGNOSIS — N186 End stage renal disease: Secondary | ICD-10-CM | POA: Diagnosis not present

## 2019-01-26 DIAGNOSIS — Z992 Dependence on renal dialysis: Secondary | ICD-10-CM | POA: Diagnosis not present

## 2019-01-27 DIAGNOSIS — N186 End stage renal disease: Secondary | ICD-10-CM | POA: Diagnosis not present

## 2019-01-27 DIAGNOSIS — Z992 Dependence on renal dialysis: Secondary | ICD-10-CM | POA: Diagnosis not present

## 2019-01-28 DIAGNOSIS — N186 End stage renal disease: Secondary | ICD-10-CM | POA: Diagnosis not present

## 2019-01-28 DIAGNOSIS — Z992 Dependence on renal dialysis: Secondary | ICD-10-CM | POA: Diagnosis not present

## 2019-01-29 DIAGNOSIS — Z992 Dependence on renal dialysis: Secondary | ICD-10-CM | POA: Diagnosis not present

## 2019-01-29 DIAGNOSIS — N186 End stage renal disease: Secondary | ICD-10-CM | POA: Diagnosis not present

## 2019-01-30 ENCOUNTER — Encounter: Admit: 2019-01-30 | Discharge: 2019-01-30 | Payer: MEDICARE | Attending: Internal Medicine | Primary: Internal Medicine

## 2019-01-30 DIAGNOSIS — N186 End stage renal disease: Secondary | ICD-10-CM | POA: Diagnosis not present

## 2019-01-30 DIAGNOSIS — E119 Type 2 diabetes mellitus without complications: Secondary | ICD-10-CM | POA: Diagnosis not present

## 2019-01-30 DIAGNOSIS — Z794 Long term (current) use of insulin: Secondary | ICD-10-CM | POA: Diagnosis not present

## 2019-01-30 DIAGNOSIS — E785 Hyperlipidemia, unspecified: Secondary | ICD-10-CM | POA: Diagnosis not present

## 2019-01-30 DIAGNOSIS — Z992 Dependence on renal dialysis: Secondary | ICD-10-CM | POA: Diagnosis not present

## 2019-01-31 DIAGNOSIS — Z992 Dependence on renal dialysis: Secondary | ICD-10-CM | POA: Diagnosis not present

## 2019-01-31 DIAGNOSIS — N186 End stage renal disease: Secondary | ICD-10-CM | POA: Diagnosis not present

## 2019-02-01 DIAGNOSIS — Z992 Dependence on renal dialysis: Secondary | ICD-10-CM | POA: Diagnosis not present

## 2019-02-01 DIAGNOSIS — N186 End stage renal disease: Secondary | ICD-10-CM | POA: Diagnosis not present

## 2019-02-02 DIAGNOSIS — N186 End stage renal disease: Secondary | ICD-10-CM | POA: Diagnosis not present

## 2019-02-02 DIAGNOSIS — Z992 Dependence on renal dialysis: Secondary | ICD-10-CM | POA: Diagnosis not present

## 2019-02-03 DIAGNOSIS — N186 End stage renal disease: Secondary | ICD-10-CM | POA: Diagnosis not present

## 2019-02-03 DIAGNOSIS — Z992 Dependence on renal dialysis: Secondary | ICD-10-CM | POA: Diagnosis not present

## 2019-02-04 DIAGNOSIS — N186 End stage renal disease: Secondary | ICD-10-CM | POA: Diagnosis not present

## 2019-02-04 DIAGNOSIS — Z992 Dependence on renal dialysis: Secondary | ICD-10-CM | POA: Diagnosis not present

## 2019-02-05 DIAGNOSIS — N186 End stage renal disease: Secondary | ICD-10-CM | POA: Diagnosis not present

## 2019-02-05 DIAGNOSIS — Z992 Dependence on renal dialysis: Secondary | ICD-10-CM | POA: Diagnosis not present

## 2019-02-06 DIAGNOSIS — N186 End stage renal disease: Secondary | ICD-10-CM | POA: Diagnosis not present

## 2019-02-06 DIAGNOSIS — Z992 Dependence on renal dialysis: Secondary | ICD-10-CM | POA: Diagnosis not present

## 2019-02-07 DIAGNOSIS — Z992 Dependence on renal dialysis: Secondary | ICD-10-CM | POA: Diagnosis not present

## 2019-02-07 DIAGNOSIS — N186 End stage renal disease: Secondary | ICD-10-CM | POA: Diagnosis not present

## 2019-02-08 DIAGNOSIS — Z992 Dependence on renal dialysis: Secondary | ICD-10-CM | POA: Diagnosis not present

## 2019-02-08 DIAGNOSIS — N186 End stage renal disease: Secondary | ICD-10-CM | POA: Diagnosis not present

## 2019-02-09 DIAGNOSIS — N186 End stage renal disease: Secondary | ICD-10-CM | POA: Diagnosis not present

## 2019-02-09 DIAGNOSIS — Z992 Dependence on renal dialysis: Secondary | ICD-10-CM | POA: Diagnosis not present

## 2019-02-10 DIAGNOSIS — N186 End stage renal disease: Secondary | ICD-10-CM | POA: Diagnosis not present

## 2019-02-10 DIAGNOSIS — Z992 Dependence on renal dialysis: Secondary | ICD-10-CM | POA: Diagnosis not present

## 2019-02-10 DIAGNOSIS — Z01818 Encounter for other preprocedural examination: Secondary | ICD-10-CM

## 2019-02-10 DIAGNOSIS — Z7682 Awaiting organ transplant status: Principal | ICD-10-CM

## 2019-02-11 DIAGNOSIS — Z992 Dependence on renal dialysis: Secondary | ICD-10-CM | POA: Diagnosis not present

## 2019-02-11 DIAGNOSIS — N186 End stage renal disease: Secondary | ICD-10-CM | POA: Diagnosis not present

## 2019-02-12 DIAGNOSIS — Z992 Dependence on renal dialysis: Secondary | ICD-10-CM | POA: Diagnosis not present

## 2019-02-12 DIAGNOSIS — N186 End stage renal disease: Secondary | ICD-10-CM | POA: Diagnosis not present

## 2019-02-13 DIAGNOSIS — N186 End stage renal disease: Secondary | ICD-10-CM | POA: Diagnosis not present

## 2019-02-13 DIAGNOSIS — Z992 Dependence on renal dialysis: Secondary | ICD-10-CM | POA: Diagnosis not present

## 2019-02-14 DIAGNOSIS — N186 End stage renal disease: Secondary | ICD-10-CM | POA: Diagnosis not present

## 2019-02-14 DIAGNOSIS — Z992 Dependence on renal dialysis: Secondary | ICD-10-CM | POA: Diagnosis not present

## 2019-02-15 DIAGNOSIS — N186 End stage renal disease: Secondary | ICD-10-CM | POA: Diagnosis not present

## 2019-02-15 DIAGNOSIS — Z992 Dependence on renal dialysis: Secondary | ICD-10-CM | POA: Diagnosis not present

## 2019-02-16 DIAGNOSIS — Z992 Dependence on renal dialysis: Secondary | ICD-10-CM | POA: Diagnosis not present

## 2019-02-16 DIAGNOSIS — N186 End stage renal disease: Secondary | ICD-10-CM | POA: Diagnosis not present

## 2019-02-17 DIAGNOSIS — Z992 Dependence on renal dialysis: Secondary | ICD-10-CM | POA: Diagnosis not present

## 2019-02-17 DIAGNOSIS — N186 End stage renal disease: Secondary | ICD-10-CM | POA: Diagnosis not present

## 2019-02-18 DIAGNOSIS — Z992 Dependence on renal dialysis: Secondary | ICD-10-CM | POA: Diagnosis not present

## 2019-02-18 DIAGNOSIS — N186 End stage renal disease: Secondary | ICD-10-CM | POA: Diagnosis not present

## 2019-02-19 DIAGNOSIS — N186 End stage renal disease: Secondary | ICD-10-CM | POA: Diagnosis not present

## 2019-02-19 DIAGNOSIS — Z992 Dependence on renal dialysis: Secondary | ICD-10-CM | POA: Diagnosis not present

## 2019-02-20 DIAGNOSIS — N186 End stage renal disease: Secondary | ICD-10-CM | POA: Diagnosis not present

## 2019-02-20 DIAGNOSIS — Z992 Dependence on renal dialysis: Secondary | ICD-10-CM | POA: Diagnosis not present

## 2019-02-21 DIAGNOSIS — Z992 Dependence on renal dialysis: Secondary | ICD-10-CM | POA: Diagnosis not present

## 2019-02-21 DIAGNOSIS — N186 End stage renal disease: Secondary | ICD-10-CM | POA: Diagnosis not present

## 2019-02-22 DIAGNOSIS — Z992 Dependence on renal dialysis: Secondary | ICD-10-CM | POA: Diagnosis not present

## 2019-02-22 DIAGNOSIS — N186 End stage renal disease: Secondary | ICD-10-CM | POA: Diagnosis not present

## 2019-02-23 DIAGNOSIS — N186 End stage renal disease: Secondary | ICD-10-CM | POA: Diagnosis not present

## 2019-02-23 DIAGNOSIS — Z992 Dependence on renal dialysis: Secondary | ICD-10-CM | POA: Diagnosis not present

## 2019-02-24 ENCOUNTER — Other Ambulatory Visit: Payer: Self-pay | Admitting: Internal Medicine

## 2019-02-24 DIAGNOSIS — Z992 Dependence on renal dialysis: Secondary | ICD-10-CM | POA: Diagnosis not present

## 2019-02-24 DIAGNOSIS — N186 End stage renal disease: Secondary | ICD-10-CM | POA: Diagnosis not present

## 2019-02-24 DIAGNOSIS — Z1231 Encounter for screening mammogram for malignant neoplasm of breast: Secondary | ICD-10-CM

## 2019-02-25 ENCOUNTER — Other Ambulatory Visit: Payer: Self-pay

## 2019-02-25 ENCOUNTER — Ambulatory Visit
Admission: RE | Admit: 2019-02-25 | Discharge: 2019-02-25 | Disposition: A | Discharge status: Home / Self Care | Payer: Medicare HMO | Source: Ambulatory Visit | Attending: Internal Medicine | Admitting: Internal Medicine

## 2019-02-25 DIAGNOSIS — N186 End stage renal disease: Secondary | ICD-10-CM | POA: Diagnosis not present

## 2019-02-25 DIAGNOSIS — Z992 Dependence on renal dialysis: Secondary | ICD-10-CM | POA: Diagnosis not present

## 2019-02-25 DIAGNOSIS — Z1231 Encounter for screening mammogram for malignant neoplasm of breast: Secondary | ICD-10-CM

## 2019-02-26 DIAGNOSIS — N186 End stage renal disease: Secondary | ICD-10-CM | POA: Diagnosis not present

## 2019-02-26 DIAGNOSIS — Z992 Dependence on renal dialysis: Secondary | ICD-10-CM | POA: Diagnosis not present

## 2019-02-27 DIAGNOSIS — Z992 Dependence on renal dialysis: Secondary | ICD-10-CM | POA: Diagnosis not present

## 2019-02-27 DIAGNOSIS — N186 End stage renal disease: Secondary | ICD-10-CM | POA: Diagnosis not present

## 2019-02-28 DIAGNOSIS — Z992 Dependence on renal dialysis: Secondary | ICD-10-CM | POA: Diagnosis not present

## 2019-02-28 DIAGNOSIS — N186 End stage renal disease: Secondary | ICD-10-CM | POA: Diagnosis not present

## 2019-03-01 DIAGNOSIS — N186 End stage renal disease: Secondary | ICD-10-CM | POA: Diagnosis not present

## 2019-03-01 DIAGNOSIS — Z992 Dependence on renal dialysis: Secondary | ICD-10-CM | POA: Diagnosis not present

## 2019-03-02 DIAGNOSIS — Z992 Dependence on renal dialysis: Secondary | ICD-10-CM | POA: Diagnosis not present

## 2019-03-02 DIAGNOSIS — N186 End stage renal disease: Secondary | ICD-10-CM | POA: Diagnosis not present

## 2019-03-03 DIAGNOSIS — N186 End stage renal disease: Secondary | ICD-10-CM | POA: Diagnosis not present

## 2019-03-03 DIAGNOSIS — Z992 Dependence on renal dialysis: Secondary | ICD-10-CM | POA: Diagnosis not present

## 2019-03-04 ENCOUNTER — Encounter: Admit: 2019-03-04 | Discharge: 2019-03-04 | Payer: MEDICARE | Attending: Internal Medicine | Primary: Internal Medicine

## 2019-03-04 DIAGNOSIS — Z992 Dependence on renal dialysis: Secondary | ICD-10-CM | POA: Diagnosis not present

## 2019-03-04 DIAGNOSIS — N186 End stage renal disease: Secondary | ICD-10-CM | POA: Diagnosis not present

## 2019-03-05 ENCOUNTER — Other Ambulatory Visit: Payer: Self-pay | Admitting: Nephrology

## 2019-03-05 DIAGNOSIS — T85611A Breakdown (mechanical) of intraperitoneal dialysis catheter, initial encounter: Secondary | ICD-10-CM

## 2019-03-05 DIAGNOSIS — Z992 Dependence on renal dialysis: Secondary | ICD-10-CM | POA: Diagnosis not present

## 2019-03-05 DIAGNOSIS — N186 End stage renal disease: Secondary | ICD-10-CM | POA: Diagnosis not present

## 2019-03-05 DIAGNOSIS — E8770 Fluid overload, unspecified: Secondary | ICD-10-CM | POA: Diagnosis not present

## 2019-03-06 ENCOUNTER — Ambulatory Visit: Admit: 2019-03-06 | Discharge: 2019-03-07 | Disposition: A | Discharge status: Home / Self Care | Payer: MEDICARE

## 2019-03-06 ENCOUNTER — Emergency Department: Admit: 2019-03-06 | Discharge: 2019-03-07 | Disposition: A | Discharge status: Home / Self Care | Payer: MEDICARE

## 2019-03-06 ENCOUNTER — Other Ambulatory Visit: Payer: Self-pay

## 2019-03-06 ENCOUNTER — Ambulatory Visit
Admission: RE | Admit: 2019-03-06 | Discharge: 2019-03-06 | Disposition: A | Discharge status: Home / Self Care | Payer: Medicare HMO | Source: Ambulatory Visit | Attending: Nephrology | Admitting: Nephrology

## 2019-03-06 ENCOUNTER — Ambulatory Visit
Admission: RE | Admit: 2019-03-06 | Discharge: 2019-03-06 | Disposition: A | Discharge status: Home / Self Care | Payer: Medicare HMO | Attending: Nephrology | Admitting: Nephrology

## 2019-03-06 DIAGNOSIS — I2510 Atherosclerotic heart disease of native coronary artery without angina pectoris: Secondary | ICD-10-CM | POA: Diagnosis not present

## 2019-03-06 DIAGNOSIS — T85611A Breakdown (mechanical) of intraperitoneal dialysis catheter, initial encounter: Secondary | ICD-10-CM

## 2019-03-06 DIAGNOSIS — N186 End stage renal disease: Secondary | ICD-10-CM | POA: Diagnosis not present

## 2019-03-06 DIAGNOSIS — E039 Hypothyroidism, unspecified: Secondary | ICD-10-CM | POA: Diagnosis not present

## 2019-03-06 DIAGNOSIS — T85691A Other mechanical complication of intraperitoneal dialysis catheter, initial encounter: Secondary | ICD-10-CM | POA: Diagnosis not present

## 2019-03-06 DIAGNOSIS — K5730 Diverticulosis of large intestine without perforation or abscess without bleeding: Secondary | ICD-10-CM | POA: Diagnosis not present

## 2019-03-06 DIAGNOSIS — Z992 Dependence on renal dialysis: Secondary | ICD-10-CM | POA: Diagnosis not present

## 2019-03-06 DIAGNOSIS — T8241XA Breakdown (mechanical) of vascular dialysis catheter, initial encounter: Secondary | ICD-10-CM | POA: Diagnosis not present

## 2019-03-06 DIAGNOSIS — I120 Hypertensive chronic kidney disease with stage 5 chronic kidney disease or end stage renal disease: Secondary | ICD-10-CM | POA: Diagnosis not present

## 2019-03-06 DIAGNOSIS — E1122 Type 2 diabetes mellitus with diabetic chronic kidney disease: Secondary | ICD-10-CM | POA: Diagnosis not present

## 2019-03-06 DIAGNOSIS — R109 Unspecified abdominal pain: Secondary | ICD-10-CM | POA: Diagnosis not present

## 2019-03-06 DIAGNOSIS — Z87891 Personal history of nicotine dependence: Secondary | ICD-10-CM | POA: Diagnosis not present

## 2019-03-06 DIAGNOSIS — Z7982 Long term (current) use of aspirin: Secondary | ICD-10-CM | POA: Diagnosis not present

## 2019-03-07 DIAGNOSIS — K5730 Diverticulosis of large intestine without perforation or abscess without bleeding: Secondary | ICD-10-CM | POA: Diagnosis not present

## 2019-03-07 DIAGNOSIS — T8241XA Breakdown (mechanical) of vascular dialysis catheter, initial encounter: Principal | ICD-10-CM

## 2019-03-08 DIAGNOSIS — N186 End stage renal disease: Secondary | ICD-10-CM | POA: Diagnosis not present

## 2019-03-08 DIAGNOSIS — Z992 Dependence on renal dialysis: Secondary | ICD-10-CM | POA: Diagnosis not present

## 2019-03-11 DIAGNOSIS — Z992 Dependence on renal dialysis: Secondary | ICD-10-CM | POA: Diagnosis not present

## 2019-03-11 DIAGNOSIS — E119 Type 2 diabetes mellitus without complications: Secondary | ICD-10-CM | POA: Diagnosis not present

## 2019-03-11 DIAGNOSIS — N186 End stage renal disease: Secondary | ICD-10-CM | POA: Diagnosis not present

## 2019-03-11 DIAGNOSIS — Z794 Long term (current) use of insulin: Secondary | ICD-10-CM | POA: Diagnosis not present

## 2019-03-12 DIAGNOSIS — T85691A Other mechanical complication of intraperitoneal dialysis catheter, initial encounter: Secondary | ICD-10-CM | POA: Diagnosis not present

## 2019-03-13 DIAGNOSIS — N186 End stage renal disease: Secondary | ICD-10-CM | POA: Diagnosis not present

## 2019-03-13 DIAGNOSIS — Z992 Dependence on renal dialysis: Secondary | ICD-10-CM | POA: Diagnosis not present

## 2019-03-13 DIAGNOSIS — Z7682 Awaiting organ transplant status: Principal | ICD-10-CM

## 2019-03-13 DIAGNOSIS — Z01818 Encounter for other preprocedural examination: Secondary | ICD-10-CM

## 2019-03-15 DIAGNOSIS — E8770 Fluid overload, unspecified: Secondary | ICD-10-CM | POA: Diagnosis not present

## 2019-03-15 DIAGNOSIS — N186 End stage renal disease: Secondary | ICD-10-CM | POA: Diagnosis not present

## 2019-03-15 DIAGNOSIS — Z992 Dependence on renal dialysis: Secondary | ICD-10-CM | POA: Diagnosis not present

## 2019-03-16 DIAGNOSIS — Z992 Dependence on renal dialysis: Secondary | ICD-10-CM | POA: Diagnosis not present

## 2019-03-16 DIAGNOSIS — N186 End stage renal disease: Secondary | ICD-10-CM | POA: Diagnosis not present

## 2019-03-17 DIAGNOSIS — N186 End stage renal disease: Secondary | ICD-10-CM | POA: Diagnosis not present

## 2019-03-17 DIAGNOSIS — Z992 Dependence on renal dialysis: Secondary | ICD-10-CM | POA: Diagnosis not present

## 2019-03-18 DIAGNOSIS — Z992 Dependence on renal dialysis: Secondary | ICD-10-CM | POA: Diagnosis not present

## 2019-03-18 DIAGNOSIS — N186 End stage renal disease: Secondary | ICD-10-CM | POA: Diagnosis not present

## 2019-03-18 DIAGNOSIS — T85898A Other specified complication of other internal prosthetic devices, implants and grafts, initial encounter: Secondary | ICD-10-CM | POA: Diagnosis not present

## 2019-03-18 DIAGNOSIS — E8770 Fluid overload, unspecified: Secondary | ICD-10-CM | POA: Diagnosis not present

## 2019-03-20 ENCOUNTER — Other Ambulatory Visit
Admission: RE | Admit: 2019-03-20 | Discharge: 2019-03-20 | Disposition: A | Discharge status: Home / Self Care | Payer: Medicare HMO | Source: Ambulatory Visit | Attending: Nephrology | Admitting: Nephrology

## 2019-03-20 DIAGNOSIS — Z992 Dependence on renal dialysis: Secondary | ICD-10-CM | POA: Diagnosis not present

## 2019-03-20 DIAGNOSIS — Z8271 Family history of polycystic kidney: Secondary | ICD-10-CM | POA: Diagnosis not present

## 2019-03-20 DIAGNOSIS — N186 End stage renal disease: Secondary | ICD-10-CM | POA: Diagnosis not present

## 2019-03-20 LAB — POTASSIUM: Potassium: 6.2 mmol/L — ABNORMAL HIGH (ref 3.5–5.1)

## 2019-03-22 DIAGNOSIS — Z992 Dependence on renal dialysis: Secondary | ICD-10-CM | POA: Diagnosis not present

## 2019-03-22 DIAGNOSIS — N186 End stage renal disease: Secondary | ICD-10-CM | POA: Diagnosis not present

## 2019-03-25 DIAGNOSIS — Z992 Dependence on renal dialysis: Secondary | ICD-10-CM | POA: Diagnosis not present

## 2019-03-25 DIAGNOSIS — N186 End stage renal disease: Secondary | ICD-10-CM | POA: Diagnosis not present

## 2019-03-25 DIAGNOSIS — Z23 Encounter for immunization: Secondary | ICD-10-CM | POA: Diagnosis not present

## 2019-03-27 DIAGNOSIS — Z992 Dependence on renal dialysis: Secondary | ICD-10-CM | POA: Diagnosis not present

## 2019-03-27 DIAGNOSIS — Z23 Encounter for immunization: Secondary | ICD-10-CM | POA: Diagnosis not present

## 2019-03-27 DIAGNOSIS — N186 End stage renal disease: Secondary | ICD-10-CM | POA: Diagnosis not present

## 2019-03-29 DIAGNOSIS — Z992 Dependence on renal dialysis: Secondary | ICD-10-CM | POA: Diagnosis not present

## 2019-03-29 DIAGNOSIS — N186 End stage renal disease: Secondary | ICD-10-CM | POA: Diagnosis not present

## 2019-03-29 DIAGNOSIS — Z23 Encounter for immunization: Secondary | ICD-10-CM | POA: Diagnosis not present

## 2019-04-01 DIAGNOSIS — Z23 Encounter for immunization: Secondary | ICD-10-CM | POA: Diagnosis not present

## 2019-04-01 DIAGNOSIS — N186 End stage renal disease: Secondary | ICD-10-CM | POA: Diagnosis not present

## 2019-04-01 DIAGNOSIS — Z992 Dependence on renal dialysis: Secondary | ICD-10-CM | POA: Diagnosis not present

## 2019-04-03 DIAGNOSIS — Z23 Encounter for immunization: Secondary | ICD-10-CM | POA: Diagnosis not present

## 2019-04-03 DIAGNOSIS — N186 End stage renal disease: Secondary | ICD-10-CM | POA: Diagnosis not present

## 2019-04-03 DIAGNOSIS — Z992 Dependence on renal dialysis: Secondary | ICD-10-CM | POA: Diagnosis not present

## 2019-04-04 ENCOUNTER — Telehealth: Admit: 2019-04-04 | Discharge: 2019-04-05 | Payer: MEDICARE

## 2019-04-04 DIAGNOSIS — Z01818 Encounter for other preprocedural examination: Secondary | ICD-10-CM | POA: Diagnosis not present

## 2019-04-04 DIAGNOSIS — E119 Type 2 diabetes mellitus without complications: Secondary | ICD-10-CM | POA: Diagnosis not present

## 2019-04-04 DIAGNOSIS — E785 Hyperlipidemia, unspecified: Secondary | ICD-10-CM | POA: Diagnosis not present

## 2019-04-04 DIAGNOSIS — I10 Essential (primary) hypertension: Secondary | ICD-10-CM | POA: Diagnosis not present

## 2019-04-04 DIAGNOSIS — N189 Chronic kidney disease, unspecified: Secondary | ICD-10-CM | POA: Diagnosis not present

## 2019-04-05 ENCOUNTER — Ambulatory Visit: Admit: 2019-04-05 | Discharge: 2019-04-06 | Payer: MEDICARE

## 2019-04-05 DIAGNOSIS — Z992 Dependence on renal dialysis: Secondary | ICD-10-CM | POA: Diagnosis not present

## 2019-04-05 DIAGNOSIS — Z1159 Encounter for screening for other viral diseases: Secondary | ICD-10-CM | POA: Diagnosis not present

## 2019-04-05 DIAGNOSIS — Z20828 Contact with and (suspected) exposure to other viral communicable diseases: Secondary | ICD-10-CM | POA: Diagnosis not present

## 2019-04-05 DIAGNOSIS — Z23 Encounter for immunization: Secondary | ICD-10-CM | POA: Diagnosis not present

## 2019-04-05 DIAGNOSIS — N186 End stage renal disease: Secondary | ICD-10-CM | POA: Diagnosis not present

## 2019-04-05 DIAGNOSIS — Z01812 Encounter for preprocedural laboratory examination: Secondary | ICD-10-CM | POA: Diagnosis not present

## 2019-04-07 DIAGNOSIS — Z992 Dependence on renal dialysis: Secondary | ICD-10-CM | POA: Diagnosis not present

## 2019-04-07 DIAGNOSIS — N186 End stage renal disease: Secondary | ICD-10-CM | POA: Diagnosis not present

## 2019-04-07 DIAGNOSIS — Z23 Encounter for immunization: Secondary | ICD-10-CM | POA: Diagnosis not present

## 2019-04-07 DIAGNOSIS — Z7982 Long term (current) use of aspirin: Secondary | ICD-10-CM

## 2019-04-07 DIAGNOSIS — Z880 Allergy status to penicillin: Secondary | ICD-10-CM

## 2019-04-07 DIAGNOSIS — Z79899 Other long term (current) drug therapy: Secondary | ICD-10-CM

## 2019-04-07 DIAGNOSIS — Z87891 Personal history of nicotine dependence: Secondary | ICD-10-CM

## 2019-04-07 DIAGNOSIS — E039 Hypothyroidism, unspecified: Secondary | ICD-10-CM

## 2019-04-07 DIAGNOSIS — D631 Anemia in chronic kidney disease: Secondary | ICD-10-CM

## 2019-04-07 DIAGNOSIS — I120 Hypertensive chronic kidney disease with stage 5 chronic kidney disease or end stage renal disease: Secondary | ICD-10-CM

## 2019-04-07 DIAGNOSIS — E1122 Type 2 diabetes mellitus with diabetic chronic kidney disease: Secondary | ICD-10-CM

## 2019-04-07 DIAGNOSIS — Z888 Allergy status to other drugs, medicaments and biological substances status: Secondary | ICD-10-CM

## 2019-04-07 DIAGNOSIS — Z794 Long term (current) use of insulin: Secondary | ICD-10-CM

## 2019-04-08 ENCOUNTER — Ambulatory Visit: Admit: 2019-04-08 | Discharge: 2019-04-08 | Payer: MEDICARE

## 2019-04-08 ENCOUNTER — Encounter
Admit: 2019-04-08 | Discharge: 2019-04-08 | Payer: MEDICARE | Attending: Student in an Organized Health Care Education/Training Program | Primary: Student in an Organized Health Care Education/Training Program

## 2019-04-08 DIAGNOSIS — I120 Hypertensive chronic kidney disease with stage 5 chronic kidney disease or end stage renal disease: Secondary | ICD-10-CM | POA: Diagnosis not present

## 2019-04-08 DIAGNOSIS — E039 Hypothyroidism, unspecified: Secondary | ICD-10-CM | POA: Diagnosis not present

## 2019-04-08 DIAGNOSIS — D631 Anemia in chronic kidney disease: Secondary | ICD-10-CM | POA: Diagnosis not present

## 2019-04-08 DIAGNOSIS — Z992 Dependence on renal dialysis: Secondary | ICD-10-CM | POA: Diagnosis not present

## 2019-04-08 DIAGNOSIS — N186 End stage renal disease: Secondary | ICD-10-CM | POA: Diagnosis not present

## 2019-04-08 DIAGNOSIS — Z7982 Long term (current) use of aspirin: Secondary | ICD-10-CM | POA: Diagnosis not present

## 2019-04-08 DIAGNOSIS — E1122 Type 2 diabetes mellitus with diabetic chronic kidney disease: Secondary | ICD-10-CM | POA: Diagnosis not present

## 2019-04-08 DIAGNOSIS — T85611A Breakdown (mechanical) of intraperitoneal dialysis catheter, initial encounter: Secondary | ICD-10-CM | POA: Diagnosis not present

## 2019-04-08 DIAGNOSIS — Z87891 Personal history of nicotine dependence: Secondary | ICD-10-CM | POA: Diagnosis not present

## 2019-04-08 DIAGNOSIS — Z880 Allergy status to penicillin: Secondary | ICD-10-CM | POA: Diagnosis not present

## 2019-04-08 DIAGNOSIS — Z888 Allergy status to other drugs, medicaments and biological substances status: Secondary | ICD-10-CM | POA: Diagnosis not present

## 2019-04-08 DIAGNOSIS — Z794 Long term (current) use of insulin: Secondary | ICD-10-CM

## 2019-04-08 DIAGNOSIS — Z79899 Other long term (current) drug therapy: Secondary | ICD-10-CM

## 2019-04-08 MED ORDER — TRAMADOL 50 MG TABLET
ORAL_TABLET | Freq: Four times a day (QID) | ORAL | 0 refills | 3.00000 days | Status: CP | PRN
Start: 2019-04-08 — End: ?

## 2019-04-08 MED ORDER — OXYCODONE 5 MG TABLET
ORAL_TABLET | ORAL | 0 refills | 2.00000 days | Status: CP | PRN
Start: 2019-04-08 — End: 2019-04-08

## 2019-04-10 ENCOUNTER — Emergency Department: Payer: Medicare HMO

## 2019-04-10 ENCOUNTER — Other Ambulatory Visit: Payer: Self-pay

## 2019-04-10 ENCOUNTER — Emergency Department
Admission: EM | Admit: 2019-04-10 | Discharge: 2019-04-10 | Disposition: A | Discharge status: Home / Self Care | Payer: Medicare HMO | Attending: Student | Admitting: Student

## 2019-04-10 ENCOUNTER — Encounter: Payer: Self-pay | Admitting: Emergency Medicine

## 2019-04-10 DIAGNOSIS — I2510 Atherosclerotic heart disease of native coronary artery without angina pectoris: Secondary | ICD-10-CM | POA: Diagnosis not present

## 2019-04-10 DIAGNOSIS — Z87891 Personal history of nicotine dependence: Secondary | ICD-10-CM | POA: Insufficient documentation

## 2019-04-10 DIAGNOSIS — N186 End stage renal disease: Secondary | ICD-10-CM | POA: Diagnosis not present

## 2019-04-10 DIAGNOSIS — R404 Transient alteration of awareness: Secondary | ICD-10-CM | POA: Diagnosis not present

## 2019-04-10 DIAGNOSIS — R000 Tachycardia, unspecified: Secondary | ICD-10-CM | POA: Diagnosis not present

## 2019-04-10 DIAGNOSIS — Z79899 Other long term (current) drug therapy: Secondary | ICD-10-CM | POA: Insufficient documentation

## 2019-04-10 DIAGNOSIS — J9811 Atelectasis: Secondary | ICD-10-CM | POA: Diagnosis not present

## 2019-04-10 DIAGNOSIS — I10 Essential (primary) hypertension: Secondary | ICD-10-CM | POA: Diagnosis not present

## 2019-04-10 DIAGNOSIS — E039 Hypothyroidism, unspecified: Secondary | ICD-10-CM | POA: Diagnosis not present

## 2019-04-10 DIAGNOSIS — E1122 Type 2 diabetes mellitus with diabetic chronic kidney disease: Secondary | ICD-10-CM | POA: Insufficient documentation

## 2019-04-10 DIAGNOSIS — Z7982 Long term (current) use of aspirin: Secondary | ICD-10-CM | POA: Diagnosis not present

## 2019-04-10 DIAGNOSIS — Z794 Long term (current) use of insulin: Secondary | ICD-10-CM | POA: Insufficient documentation

## 2019-04-10 DIAGNOSIS — I132 Hypertensive heart and chronic kidney disease with heart failure and with stage 5 chronic kidney disease, or end stage renal disease: Secondary | ICD-10-CM | POA: Diagnosis not present

## 2019-04-10 DIAGNOSIS — R410 Disorientation, unspecified: Secondary | ICD-10-CM | POA: Diagnosis not present

## 2019-04-10 DIAGNOSIS — I5042 Chronic combined systolic (congestive) and diastolic (congestive) heart failure: Secondary | ICD-10-CM | POA: Diagnosis not present

## 2019-04-10 DIAGNOSIS — Z992 Dependence on renal dialysis: Secondary | ICD-10-CM | POA: Insufficient documentation

## 2019-04-10 DIAGNOSIS — R4182 Altered mental status, unspecified: Secondary | ICD-10-CM | POA: Diagnosis not present

## 2019-04-10 DIAGNOSIS — Z23 Encounter for immunization: Secondary | ICD-10-CM | POA: Diagnosis not present

## 2019-04-10 LAB — COMPREHENSIVE METABOLIC PANEL
ALT: 15 U/L (ref 0–44)
AST: 21 U/L (ref 15–41)
Albumin: 3.9 g/dL (ref 3.5–5.0)
Alkaline Phosphatase: 73 U/L (ref 38–126)
Anion gap: 12 (ref 5–15)
BUN: 16 mg/dL (ref 8–23)
CO2: 27 mmol/L (ref 22–32)
Calcium: 8.2 mg/dL — ABNORMAL LOW (ref 8.9–10.3)
Chloride: 95 mmol/L — ABNORMAL LOW (ref 98–111)
Creatinine, Ser: 4.78 mg/dL — ABNORMAL HIGH (ref 0.44–1.00)
GFR calc Af Amer: 10 mL/min — ABNORMAL LOW (ref >60)
GFR calc non Af Amer: 9 mL/min — ABNORMAL LOW (ref >60)
Glucose, Bld: 92 mg/dL (ref 70–99)
Potassium: 3.6 mmol/L (ref 3.5–5.1)
Sodium: 134 mmol/L — ABNORMAL LOW (ref 135–145)
Total Bilirubin: 0.9 mg/dL (ref 0.3–1.2)
Total Protein: 7.3 g/dL (ref 6.5–8.1)

## 2019-04-10 LAB — CBC WITH DIFFERENTIAL/PLATELET
Abs Immature Granulocytes: 0.05 10*3/uL (ref 0.00–0.07)
Basophils Absolute: 0 10*3/uL (ref 0.0–0.1)
Basophils Relative: 0 %
Eosinophils Absolute: 0.1 10*3/uL (ref 0.0–0.5)
Eosinophils Relative: 1 %
HCT: 33.6 % — ABNORMAL LOW (ref 36.0–46.0)
Hemoglobin: 11 g/dL — ABNORMAL LOW (ref 12.0–15.0)
Immature Granulocytes: 0 %
Lymphocytes Relative: 5 %
Lymphs Abs: 0.6 10*3/uL — ABNORMAL LOW (ref 0.7–4.0)
MCH: 30.6 pg (ref 26.0–34.0)
MCHC: 32.7 g/dL (ref 30.0–36.0)
MCV: 93.6 fL (ref 80.0–100.0)
Monocytes Absolute: 0.7 10*3/uL (ref 0.1–1.0)
Monocytes Relative: 6 %
Neutro Abs: 10 10*3/uL — ABNORMAL HIGH (ref 1.7–7.7)
Neutrophils Relative %: 88 %
Platelets: 156 10*3/uL (ref 150–400)
RBC: 3.59 MIL/uL — ABNORMAL LOW (ref 3.87–5.11)
RDW: 15.5 % (ref 11.5–15.5)
WBC: 11.5 10*3/uL — ABNORMAL HIGH (ref 4.0–10.5)
nRBC: 0 % (ref 0.0–0.2)

## 2019-04-10 LAB — SALICYLATE LEVEL: Salicylate Lvl: <7 mg/dL (ref 2.8–30.0)

## 2019-04-10 LAB — ETHANOL: Alcohol, Ethyl (B): <10 mg/dL (ref <10)

## 2019-04-10 LAB — ACETAMINOPHEN LEVEL: Acetaminophen (Tylenol), Serum: <10 ug/mL — ABNORMAL LOW (ref 10–30)

## 2019-04-10 MED ORDER — HYDRALAZINE HCL 50 MG PO TABS
50.0000 mg | ORAL_TABLET | Freq: Once | ORAL | Status: AC
  Administered 2019-04-10: 18:00:00 50 mg via ORAL
  Filled 2019-04-10: qty 1

## 2019-04-10 MED ORDER — CARVEDILOL 25 MG PO TABS
25.0000 mg | ORAL_TABLET | Freq: Once | ORAL | Status: AC
  Administered 2019-04-10: 25 mg via ORAL
  Filled 2019-04-10: qty 1

## 2019-04-10 MED ORDER — LABETALOL HCL 5 MG/ML IV SOLN
10.0000 mg | Freq: Once | INTRAVENOUS | Status: AC
  Administered 2019-04-10: 17:00:00 10 mg via INTRAVENOUS
  Filled 2019-04-10: qty 4

## 2019-04-10 MED ORDER — HYDRALAZINE HCL 20 MG/ML IJ SOLN: 10.0000 mg | Freq: Once | INTRAMUSCULAR | Status: DC

## 2019-04-10 NOTE — ED Triage Notes (Signed)
PT to ER via EMS from Dialysis.  Pt completed a full treatment then became lethargic and hypertensive and tachycardic.  For EMS pt has been agitated, but cooperative.  Pt arrives able to answer questions, but with much coaching.

## 2019-04-10 NOTE — Discharge Instructions (Addendum)
Thank you for letting us take care of you in the emergency department today.  ° °Please continue to take any regular, prescribed medications.  ° °Please follow up with: °- Your primary care doctor to review your ER visit and follow up on your symptoms.  ° °Please return to the ER for any new or worsening symptoms.  ° °

## 2019-04-10 NOTE — ED Provider Notes (Signed)
[facility] Emergency Department Provider Note  ____________________________________________   First MD Initiated Contact with Patient 04/10/19 1601     (approximate)  I have reviewed the triage vital signs and the nursing notes.  History  Chief Complaint Altered Mental Status    HPI Bethany Lee is a 68 y.o. female with a history of heart failure, ESRD (dialyzes T/T/S) via LUE AVF, recent PD cath removal and replacement at [facility] on 04/08/19, who presents from dialysis for altered mental status and confusion.  Per report, the patient had completed her entire session without issues or VS abnormalities, when she became acutely hypertensive (SBP 210s), tachycardic, and confused.  On arrival to the emergency department, she is oriented to self, but states her age incorrectly. She refuses to answer the remainder of orientation questions.          Past Medical Hx Past Medical History:  Diagnosis Date  . Anemia   . CHF (congestive heart failure) ([place])   . Chronic kidney disease    peritoneal dialysis  . Complication of anesthesia   . Coronary artery disease   . Diabetes mellitus without complication ([place])   . Dialysis patient [facility])    Peritoneal dialysis patient  . Dyspnea    doe  . Edema   . History of recent blood transfusion 01/2017  . Hypercholesterolemia   . Hypertension   . Hypothyroidism   . Neuropathy   . PONV (postoperative nausea and vomiting)   . Shingles    October-November 2017  . Steal syndrome as complication of dialysis access ([place])   . Tremors of nervous system    intermittent when taking gabapentin    Problem List Patient Active Problem List   Diagnosis Date Noted  . Abdominal distention 04/07/2018  . Hyponatremia 04/07/2018  . Dyspnea 04/07/2018  . Acidosis 04/07/2018  . Steal syndrome as complication of dialysis access ([place])   . PD catheter dysfunction ([place]) 04/06/2018  . Peritoneal dialysis catheter dysfunction  ([place]) 04/04/2018  . Complication from renal dialysis device 12/03/2017  . Elevated triglycerides with high cholesterol 02/21/2017  . Peripheral neuropathy 02/21/2017  . GIB (gastrointestinal bleeding) 02/10/2017  . Acute blood loss anemia   . Essential hypertension 02/02/2017  . GI bleeding 01/29/2017  . Subclavian arterial stenosis ([place]) 01/29/2017  . Acute respiratory distress   . Acute respiratory failure ([place]) 09/21/2016  . S/P drug eluting coronary stent placement 09/14/2016  . CAD (coronary artery disease) 09/07/2016  . Acute combined systolic and diastolic congestive heart failure ([place]) 08/17/2016  . Acute pulmonary edema ([place]) 07/16/2016  . Hypothyroidism (acquired) 06/08/2016  . ESRD on dialysis ([place]) 12/21/2015  . Peritoneal dialysis status ([place]) 12/21/2015  . Leg swelling 12/21/2015  . Anemia of chronic disease 12/21/2015  . Diabetes mellitus ([place]) 12/21/2015  . Hyperkalemia 12/21/2015  . 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 Anemia in chronic renal disease 10/04/2015  . DM type 2 with diabetic peripheral neuropathy ([place]) 09/13/2015  . Mixed hyperlipidemia 09/13/2015  . Type 2 diabetes mellitus with diabetic nephropathy, with long-term current use of insulin ([place]) 09/13/2015  . CKD (chronic kidney disease) stage 5, GFR less than 15 ml/min (HCC) 07/09/2015  . Proteinuria 05/25/2015    Past Surgical Hx Past Surgical History:  Procedure Laterality Date  . A/V FISTULAGRAM Left 01/16/2017   Procedure: A/V Fistulagram;  Surgeon: Katha Cabal, MD;  Location: [place] CV LAB;  Service: Cardiovascular;  Laterality: Left;  . A/V SHUNT INTERVENTION N/A 01/16/2017  Procedure: A/V Shunt Intervention;  Surgeon: Katha Cabal, MD;  Location: [place] [facility];  Service: Cardiovascular;  Laterality: N/A;  . ABDOMINAL HYSTERECTOMY    . AV FISTULA PLACEMENT Left 11/30/2016   Procedure: ARTERIOVENOUS (AV) FISTULA CREATION ( EXPLORE FOR CREATION BRACHIOCEPHALIC);  Surgeon:  Algernon Huxley, MD;  Location: [facility] ORS;  Service: Vascular;  Laterality: Left;  . CAPD INSERTION N/A 11/25/2015   Procedure: LAPAROSCOPIC INSERTION CONTINUOUS AMBULATORY PERITONEAL DIALYSIS  (CAPD) CATHETER;  Surgeon: Algernon Huxley, MD;  Location: [facility] ORS;  Service: Vascular;  Laterality: N/A;  . CAPD INSERTION N/A 01/06/2016   Procedure: LAPAROSCOPIC INSERTION CONTINUOUS AMBULATORY PERITONEAL DIALYSIS  (CAPD) CATHETER REVISION ;  Surgeon: Algernon Huxley, MD;  Location: [facility] ORS;  Service: Vascular;  Laterality: N/A;  . CAPD INSERTION N/A 10/11/2016   Procedure: LAPAROSCOPIC INSERTION CONTINUOUS AMBULATORY PERITONEAL DIALYSIS  (CAPD) CATHETER ( REVISION );  Surgeon: Algernon Huxley, MD;  Location: [facility] ORS;  Service: Vascular;  Laterality: N/A;  . CARDIAC CATHETERIZATION    . CATARACT EXTRACTION W/PHACO Right 05/25/2016   Procedure: CATARACT EXTRACTION PHACO AND INTRAOCULAR LENS PLACEMENT (IOC);  Surgeon: Eulogio Bear, MD;  Location: [facility] ORS;  Service: Ophthalmology;  Laterality: Right;  Lot # [id number] H [place]: 00:5.2 AP%: 11.3 CDE: 7.21  . CATARACT EXTRACTION W/PHACO Left 12/06/2017   Procedure: CATARACT EXTRACTION PHACO AND INTRAOCULAR LENS PLACEMENT (IOC);  Surgeon: Eulogio Bear, MD;  Location: [facility] ORS;  Service: Ophthalmology;  Laterality: Left;  [place] 00.23.4 AP% 7.9 CDE 1.83 Fluid Pack Lot # [id number]  . COLONOSCOPY WITH PROPOFOL N/A 02/15/2017   Procedure: COLONOSCOPY WITH PROPOFOL;  Surgeon: [name], MD;  Location: [facility] ENDOSCOPY;  Service: Gastroenterology;  Laterality: N/A;  . CORONARY ANGIOPLASTY     STENT  . CORONARY STENT INTERVENTION N/A 09/07/2016   Procedure: Coronary Stent Intervention;  Surgeon: Isaias Cowman, MD;  Location: [place] [facility];  Service: Cardiovascular;  Laterality: N/A;  . DIALYSIS/PERMA CATHETER INSERTION N/A 09/21/2016   Procedure: Dialysis/Perma Catheter Insertion;  Surgeon: Algernon Huxley, MD;  Location: [place] [facility];  Service: Cardiovascular;   Laterality: N/A;  . DIALYSIS/PERMA CATHETER REMOVAL Right 11/30/2016   Procedure: DIALYSIS/PERMA CATHETER REMOVAL;  Surgeon: Algernon Huxley, MD;  Location: [facility] ORS;  Service: Vascular;  Laterality: Right;  . ESOPHAGOGASTRODUODENOSCOPY (EGD) WITH PROPOFOL N/A 02/15/2017   Procedure: ESOPHAGOGASTRODUODENOSCOPY (EGD) WITH PROPOFOL;  Surgeon: [name], MD;  Location: [facility] ENDOSCOPY;  Service: Gastroenterology;  Laterality: N/A;  . EYE SURGERY    . GIVENS CAPSULE STUDY N/A 02/26/2017   Procedure: GIVENS CAPSULE STUDY;  Surgeon: [name], MD;  Location: [facility] ENDOSCOPY;  Service: Gastroenterology;  Laterality: N/A;  . INSERTION OF DIALYSIS CATHETER N/A 04/07/2018   Procedure: INSERTION OF DIALYSIS CATHETER;  Surgeon: Rosetta Posner, MD;  Location: [facility] OR;  Service: Vascular;  Laterality: N/A;  . LEFT HEART CATH AND CORONARY ANGIOGRAPHY Left 09/07/2016   Procedure: Left Heart Cath and Coronary Angiography;  Surgeon: Isaias Cowman, MD;  Location: [place] [facility];  Service: Cardiovascular;  Laterality: Left;  . LIGATION OF ARTERIOVENOUS  FISTULA Left 02/07/2017   Procedure: LIGATION OF ARTERIOVENOUS  FISTULA ( BANDING BRACHIAL CEPHALIC );  Surgeon: Katha Cabal, MD;  Location: [facility] ORS;  Service: Vascular;  Laterality: Left;  . TUBAL LIGATION    . UPPER EXTREMITY ANGIOGRAPHY Left 01/19/2017   Procedure: Upper Extremity Angiography;  Surgeon: Katha Cabal, MD;  Location: [place] [facility] LAB;  Service: Cardiovascular;  Laterality:  Left;    Medications Prior to Admission medications   Medication Sig Start Date End Date Taking? Authorizing Provider  aspirin EC 81 MG tablet Take 1 tablet (81 mg total) by mouth daily. Start taking after NOV 15 Patient taking differently: Take 81 mg by mouth daily.  05/26/15   Murlean Iba, MD  calcium acetate (PHOSLO) 667 MG capsule Take 2,001 mg by mouth 3 (three) times daily with meals.    [provider]  carvedilol (COREG) 12.5 MG tablet  Take 1 tablet (12.5 mg total) by mouth 2 (two) times daily with a meal. Patient taking differently: Take 25 mg by mouth 2 (two) times daily with a meal.  07/20/16   Fritzi Mandes, MD  hydrALAZINE (APRESOLINE) 50 MG tablet Take 50 mg by mouth 2 (two) times daily.    [provider]  HYDROcodone-acetaminophen (NORCO/VICODIN) 5-325 MG tablet Take 1 tablet by mouth every 4 (four) hours as needed for moderate pain (for 5 days).    [provider]  insulin aspart (NOVOLOG FLEXPEN) 100 UNIT/ML FlexPen Inject 4-8 Units into the skin 3 (three) times daily with meals. Sliding scale  Below 200 take 4 units 200-250=6 units  >250= 8 units 09/13/15   [provider]  Insulin Glargine (TOUJEO SOLOSTAR) 300 UNIT/ML SOPN Inject 20 Units into the skin at bedtime. Patient taking differently: Inject 24 Units into the skin at bedtime.  12/21/15   Theodoro Grist, MD  iron polysaccharides (NIFEREX) 150 MG capsule Take 150 mg by mouth daily.    [provider]  levothyroxine (SYNTHROID, LEVOTHROID) 112 MCG tablet Take 112 mcg by mouth daily before breakfast.     [provider]  lovastatin (MEVACOR) 40 MG tablet Take 40 mg by mouth at bedtime.    [provider]  Multiple Vitamins-Minerals (DIALYVITE 800/ULTRA D) TABS Take 1 tablet by mouth daily.    [provider]  pantoprazole (PROTONIX) 40 MG tablet Take 1 tablet (40 mg total) by mouth 2 (two) times daily. Patient taking differently: Take 40 mg by mouth daily.  01/31/17   Max Sane, MD  sevelamer carbonate (RENVELA) 800 MG tablet Take 1,600 mg by mouth 3 (three) times daily with meals.    [provider]  urea (CARMOL) 20 % cream Apply 1 application topically daily as needed (for dry skin).    [provider]    Allergies Valacyclovir  Family Hx Family History  Problem Relation Age of Onset  . Diabetes Mother     Social Hx Social History   Tobacco Use  . Smoking status: Former  Smoker    Quit date: 10/09/2008    Years since quitting: 10.5  . Smokeless tobacco: Never Used  Substance Use Topics  . Alcohol use: No  . Drug use: No     Review of Systems Unable to obtain due to altered mental status.   Physical Exam  Vital Signs: ED Triage Vitals  Enc Vitals Group     BP 04/10/19 1606 126/74     Pulse Rate 04/10/19 1606 97     Resp 04/10/19 1606 19     Temp --      Temp src --      SpO2 04/10/19 1606 94 %     Weight 04/10/19 1607 141 lb 1.5 oz (64 kg)     Height 04/10/19 1607 5\' 5"  (1.651 m)     Head Circumference --      Peak Flow --  Pain Score --      Pain Loc --      Pain Edu? --      Excl. in [place]? --     Constitutional: [place].  Oriented to self.  Answers her age incorrectly, then refuses to answer remainder of orientation questions.  Eyes: Conjunctivae clear. Sclera anicteric. Head: Normocephalic. Atraumatic. Nose: No congestion. No rhinorrhea. Mouth/Throat: Mucous membranes are moist.  Neck: No stridor.   Cardiovascular: Normal rate, regular rhythm. LUE AVF with palpable thrill.  Respiratory: Normal respiratory effort.  Lungs CTAB. Gastrointestinal: Soft and non-tender. PD catheter in place, surrounding skin is well appearing. Recent surgical incision sites are C/D/I and without erythema or drainage.  Musculoskeletal: No lower extremity edema. Neurologic:  Normal speech and language. No gross focal neurologic deficits are appreciated. Moves all extremities equally. Skin: Skin is warm, dry and intact. Recent surgical incision sites are C/D/I and without erythema or drainage.  Psychiatric: Somewhat irritable.   EKG  Personally reviewed.   Rate: 96 Rhythm: sinus Axis: normal Intervals: WNL No acute ischemic changes No STEMI    Radiology  CT head: IMPRESSION: Normal exam.  XR chest: IMPRESSION: 1. Slight atelectasis at the right lung base laterally. 2. Aortic atherosclerosis.    Procedures  Procedure(s) performed  (including critical care):  Procedures   Initial Impression / Assessment and Plan / ED Course  68 y.o. female history of ESRD, on HD who presents to the ED from dialysis for confusion, hypertension.   Ddx: hypertensive encephalopathy/emergency, PRES, CVA, hypoglycemia, electrolyte abnormality, amongst others. No evidence of surgical site infection or complications from her PD catheter placement.   Plan: we will obtain labs, imaging, will give dose of IV labetalol and reassess blood pressure and mental status.  Lab work is without actionable derangements. Imaging negative. After IV labetalol modest improvement in blood pressure from 371I to 967E systolic.  On reassessment of the patient, she is alert and oriented x 4.  She then tells me she refused to answer any of her initial orientation questions on arrival because "they were stupid". Husband at bedside states she is acting at her baseline.  She states she did not take any of her morning medications at dialysis, and therefore has not had any of her antihypertensives.  We will give a dose of her oral medications here, PO challenge, and if she remains at baseline we will plan for discharge per her and husband's request.  BP continues to improve, 170s/50s. Remains AOx4 and at her baseline.  As such, will plan for discharge as requested.  Advised close PCP follow-up and given return precautions.  Patient and husband comfortable to plan and discharge.   Final Clinical Impression(s) / ED Diagnosis  Final diagnoses:  Confusion       Note:  This document was prepared using Dragon voice recognition software and may include unintentional dictation errors.   Lilia Pro., MD 04/11/19 [phone]

## 2019-04-12 DIAGNOSIS — Z992 Dependence on renal dialysis: Secondary | ICD-10-CM | POA: Diagnosis not present

## 2019-04-12 DIAGNOSIS — Z23 Encounter for immunization: Secondary | ICD-10-CM | POA: Diagnosis not present

## 2019-04-12 DIAGNOSIS — N186 End stage renal disease: Secondary | ICD-10-CM | POA: Diagnosis not present

## 2019-04-15 DIAGNOSIS — Z23 Encounter for immunization: Secondary | ICD-10-CM | POA: Diagnosis not present

## 2019-04-15 DIAGNOSIS — Z992 Dependence on renal dialysis: Secondary | ICD-10-CM | POA: Diagnosis not present

## 2019-04-15 DIAGNOSIS — N186 End stage renal disease: Secondary | ICD-10-CM | POA: Diagnosis not present

## 2019-04-17 DIAGNOSIS — Z992 Dependence on renal dialysis: Secondary | ICD-10-CM | POA: Diagnosis not present

## 2019-04-17 DIAGNOSIS — N186 End stage renal disease: Secondary | ICD-10-CM | POA: Diagnosis not present

## 2019-04-17 DIAGNOSIS — Z23 Encounter for immunization: Secondary | ICD-10-CM | POA: Diagnosis not present

## 2019-04-19 DIAGNOSIS — N186 End stage renal disease: Secondary | ICD-10-CM | POA: Diagnosis not present

## 2019-04-19 DIAGNOSIS — Z992 Dependence on renal dialysis: Secondary | ICD-10-CM | POA: Diagnosis not present

## 2019-04-19 DIAGNOSIS — Z23 Encounter for immunization: Secondary | ICD-10-CM | POA: Diagnosis not present

## 2019-04-22 DIAGNOSIS — Z23 Encounter for immunization: Secondary | ICD-10-CM | POA: Diagnosis not present

## 2019-04-22 DIAGNOSIS — Z992 Dependence on renal dialysis: Secondary | ICD-10-CM | POA: Diagnosis not present

## 2019-04-22 DIAGNOSIS — N186 End stage renal disease: Secondary | ICD-10-CM | POA: Diagnosis not present

## 2019-04-23 DIAGNOSIS — Z992 Dependence on renal dialysis: Secondary | ICD-10-CM | POA: Diagnosis not present

## 2019-04-23 DIAGNOSIS — I871 Compression of vein: Secondary | ICD-10-CM | POA: Diagnosis not present

## 2019-04-23 DIAGNOSIS — T82858A Stenosis of vascular prosthetic devices, implants and grafts, initial encounter: Secondary | ICD-10-CM | POA: Diagnosis not present

## 2019-04-23 DIAGNOSIS — N186 End stage renal disease: Secondary | ICD-10-CM | POA: Diagnosis not present

## 2019-04-24 DIAGNOSIS — Z992 Dependence on renal dialysis: Secondary | ICD-10-CM | POA: Diagnosis not present

## 2019-04-24 DIAGNOSIS — N186 End stage renal disease: Secondary | ICD-10-CM | POA: Diagnosis not present

## 2019-04-26 DIAGNOSIS — Z992 Dependence on renal dialysis: Secondary | ICD-10-CM | POA: Diagnosis not present

## 2019-04-26 DIAGNOSIS — N186 End stage renal disease: Secondary | ICD-10-CM | POA: Diagnosis not present

## 2019-04-28 DIAGNOSIS — N186 End stage renal disease: Secondary | ICD-10-CM | POA: Diagnosis not present

## 2019-04-28 DIAGNOSIS — Z992 Dependence on renal dialysis: Secondary | ICD-10-CM | POA: Diagnosis not present

## 2019-04-30 DIAGNOSIS — Z992 Dependence on renal dialysis: Secondary | ICD-10-CM | POA: Diagnosis not present

## 2019-04-30 DIAGNOSIS — N186 End stage renal disease: Secondary | ICD-10-CM | POA: Diagnosis not present

## 2019-05-02 DIAGNOSIS — N186 End stage renal disease: Secondary | ICD-10-CM | POA: Diagnosis not present

## 2019-05-02 DIAGNOSIS — Z992 Dependence on renal dialysis: Secondary | ICD-10-CM | POA: Diagnosis not present

## 2019-05-05 DIAGNOSIS — N186 End stage renal disease: Secondary | ICD-10-CM | POA: Diagnosis not present

## 2019-05-05 DIAGNOSIS — Z992 Dependence on renal dialysis: Secondary | ICD-10-CM | POA: Diagnosis not present

## 2019-05-09 DIAGNOSIS — Z992 Dependence on renal dialysis: Secondary | ICD-10-CM | POA: Diagnosis not present

## 2019-05-09 DIAGNOSIS — R05 Cough: Secondary | ICD-10-CM | POA: Diagnosis not present

## 2019-05-09 DIAGNOSIS — N186 End stage renal disease: Secondary | ICD-10-CM | POA: Diagnosis not present

## 2019-05-10 DIAGNOSIS — N186 End stage renal disease: Secondary | ICD-10-CM | POA: Diagnosis not present

## 2019-05-10 DIAGNOSIS — Z992 Dependence on renal dialysis: Secondary | ICD-10-CM | POA: Diagnosis not present

## 2019-05-12 IMAGING — DX DG ABDOMEN 1V
2 series · 2 of 2 positions shown · non-contrast
Comparison: 09/20/2016

CLINICAL DATA: Peritoneal dialysis catheter not working properly.

EXAM:
ABDOMEN - 1 VIEW

[abdomen supine (1 of 2)]
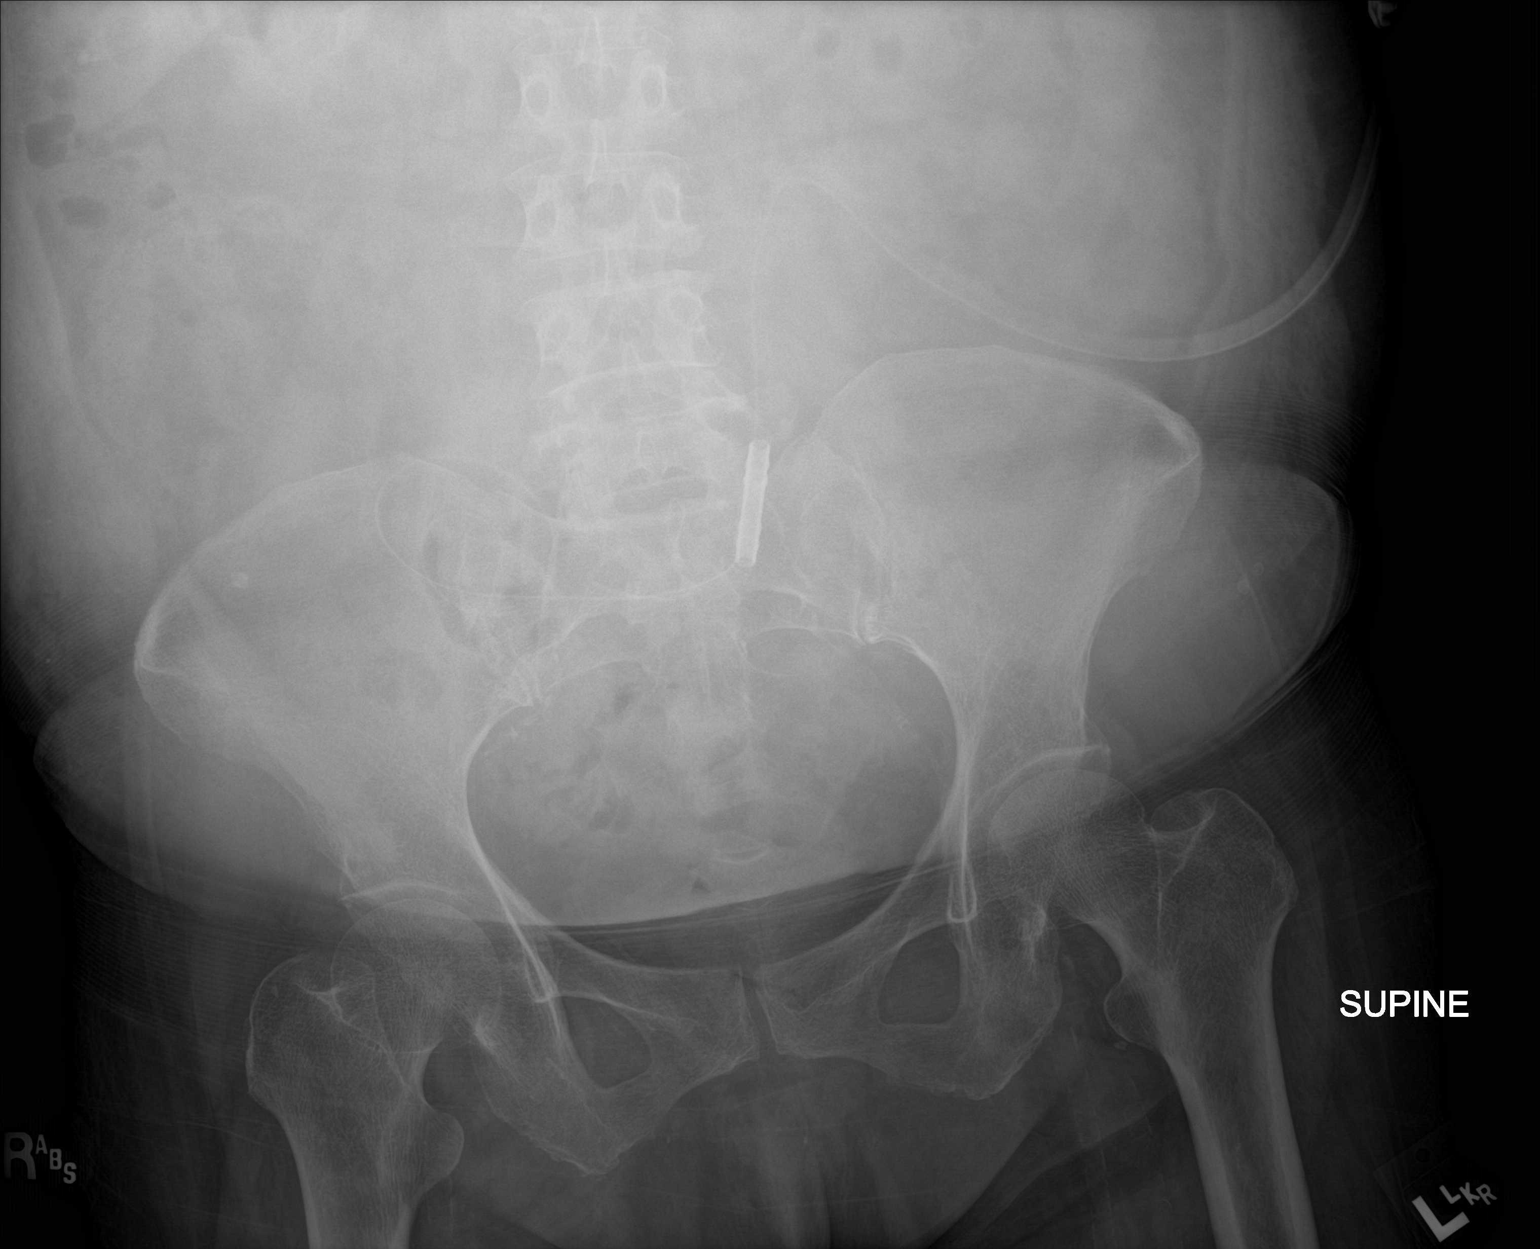

[abdomen supine (2 of 2)]
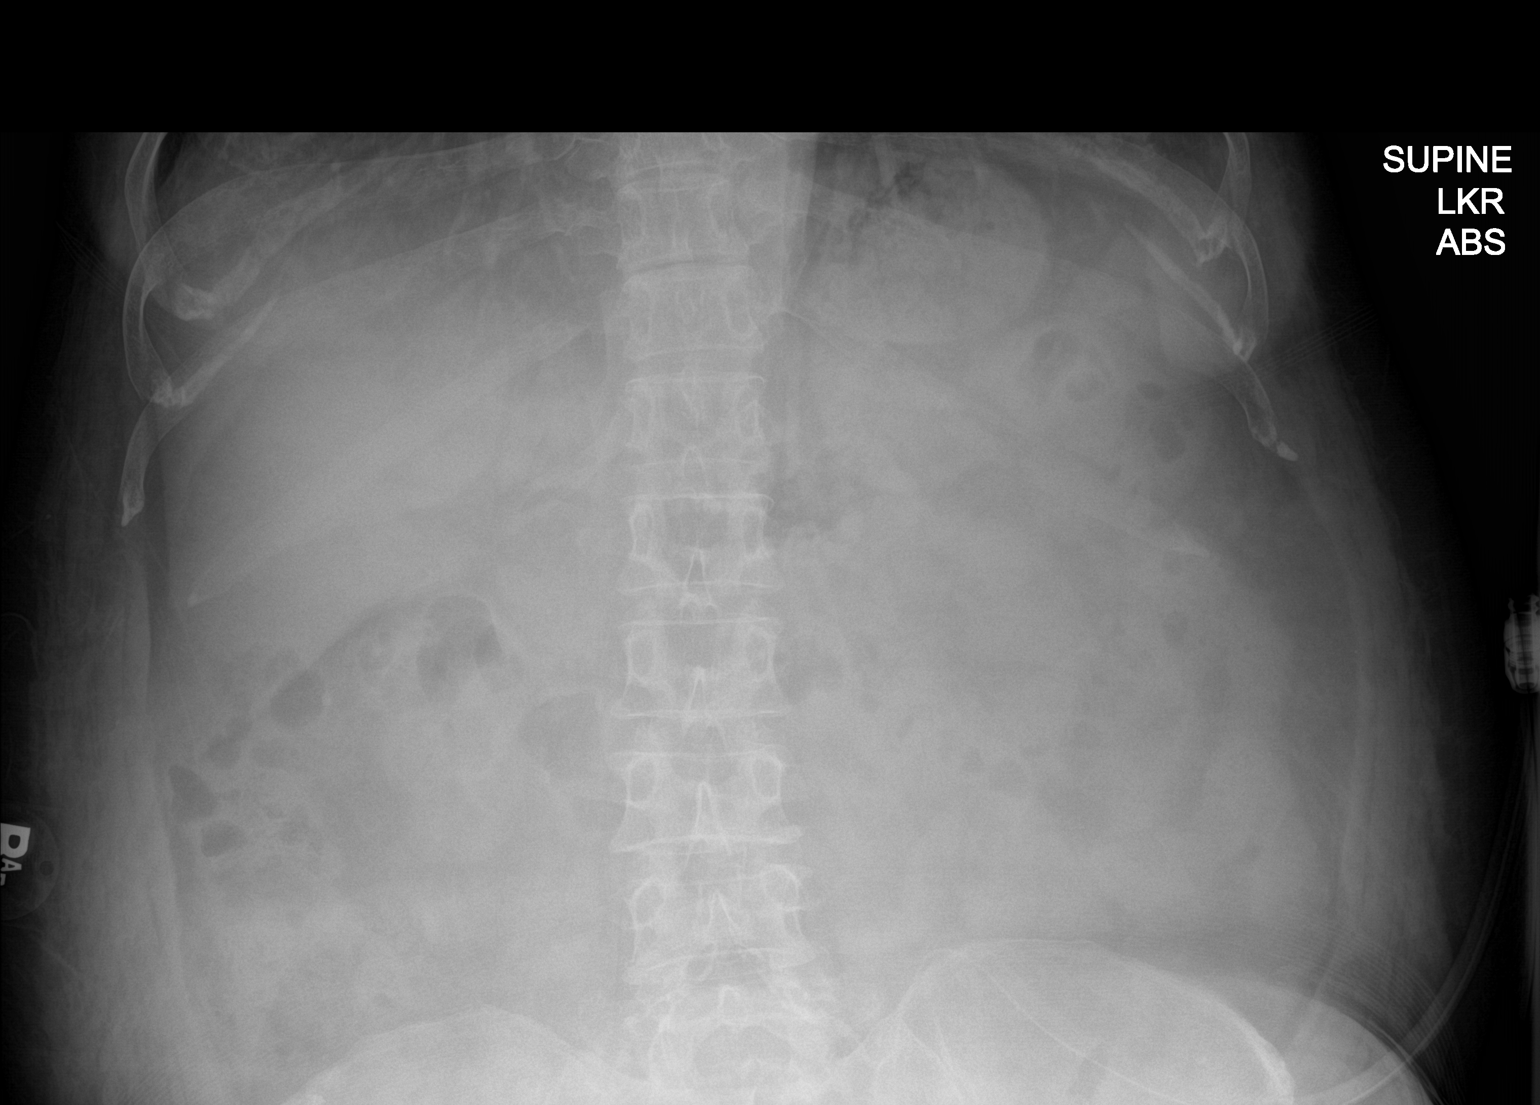

[2 of 2 positions shown; findings below may reference images not displayed]

FINDINGS: Peritoneal dialysis catheter enters the left lower abdomen and is
looped in the right iliac fossa region. I do not see a kink. Bowel
gas pattern is unremarkable. No significant bone finding.
IMPRESSION: No evidence of catheter kink. Peritoneal dialysis catheter looped in
the right iliac fossa region.

## 2019-05-13 ENCOUNTER — Ambulatory Visit
Admission: RE | Admit: 2019-05-13 | Discharge: 2019-05-13 | Disposition: A | Discharge status: Home / Self Care | Payer: Medicare HMO | Source: Ambulatory Visit | Attending: Student | Admitting: Student

## 2019-05-13 ENCOUNTER — Ambulatory Visit
Admission: RE | Admit: 2019-05-13 | Discharge: 2019-05-13 | Disposition: A | Discharge status: Home / Self Care | Payer: Medicare HMO | Attending: Student | Admitting: Student

## 2019-05-13 ENCOUNTER — Other Ambulatory Visit: Payer: Self-pay | Admitting: Student

## 2019-05-13 ENCOUNTER — Other Ambulatory Visit: Payer: Self-pay

## 2019-05-13 DIAGNOSIS — E1122 Type 2 diabetes mellitus with diabetic chronic kidney disease: Secondary | ICD-10-CM | POA: Diagnosis not present

## 2019-05-13 DIAGNOSIS — I10 Essential (primary) hypertension: Secondary | ICD-10-CM | POA: Diagnosis not present

## 2019-05-13 DIAGNOSIS — Z8774 Personal history of (corrected) congenital malformations of heart and circulatory system: Secondary | ICD-10-CM | POA: Diagnosis not present

## 2019-05-13 DIAGNOSIS — Z992 Dependence on renal dialysis: Secondary | ICD-10-CM | POA: Diagnosis not present

## 2019-05-13 DIAGNOSIS — R8290 Unspecified abnormal findings in urine: Secondary | ICD-10-CM | POA: Diagnosis not present

## 2019-05-13 DIAGNOSIS — Z452 Encounter for adjustment and management of vascular access device: Secondary | ICD-10-CM | POA: Diagnosis not present

## 2019-05-13 DIAGNOSIS — Z4682 Encounter for fitting and adjustment of non-vascular catheter: Secondary | ICD-10-CM | POA: Diagnosis not present

## 2019-05-13 DIAGNOSIS — N186 End stage renal disease: Secondary | ICD-10-CM | POA: Diagnosis not present

## 2019-05-13 DIAGNOSIS — Z955 Presence of coronary angioplasty implant and graft: Secondary | ICD-10-CM | POA: Diagnosis not present

## 2019-05-13 DIAGNOSIS — D638 Anemia in other chronic diseases classified elsewhere: Secondary | ICD-10-CM | POA: Diagnosis not present

## 2019-05-13 DIAGNOSIS — Z0000 Encounter for general adult medical examination without abnormal findings: Secondary | ICD-10-CM | POA: Diagnosis not present

## 2019-05-13 DIAGNOSIS — I2510 Atherosclerotic heart disease of native coronary artery without angina pectoris: Secondary | ICD-10-CM | POA: Diagnosis not present

## 2019-05-13 IMAGING — CR DG ABDOMEN 1V
1 series · 1 of 1 positions shown · non-contrast
Comparison: Supine view of the abdomen dated 04/05/2018. Plain film
of the abdomen dated 09/20/2016.

CLINICAL DATA: Peritoneal dialysis catheter dysfunction. Abdominal
distension.

EXAM:
ABDOMEN - 1 VIEW

[dg abd 1 view]
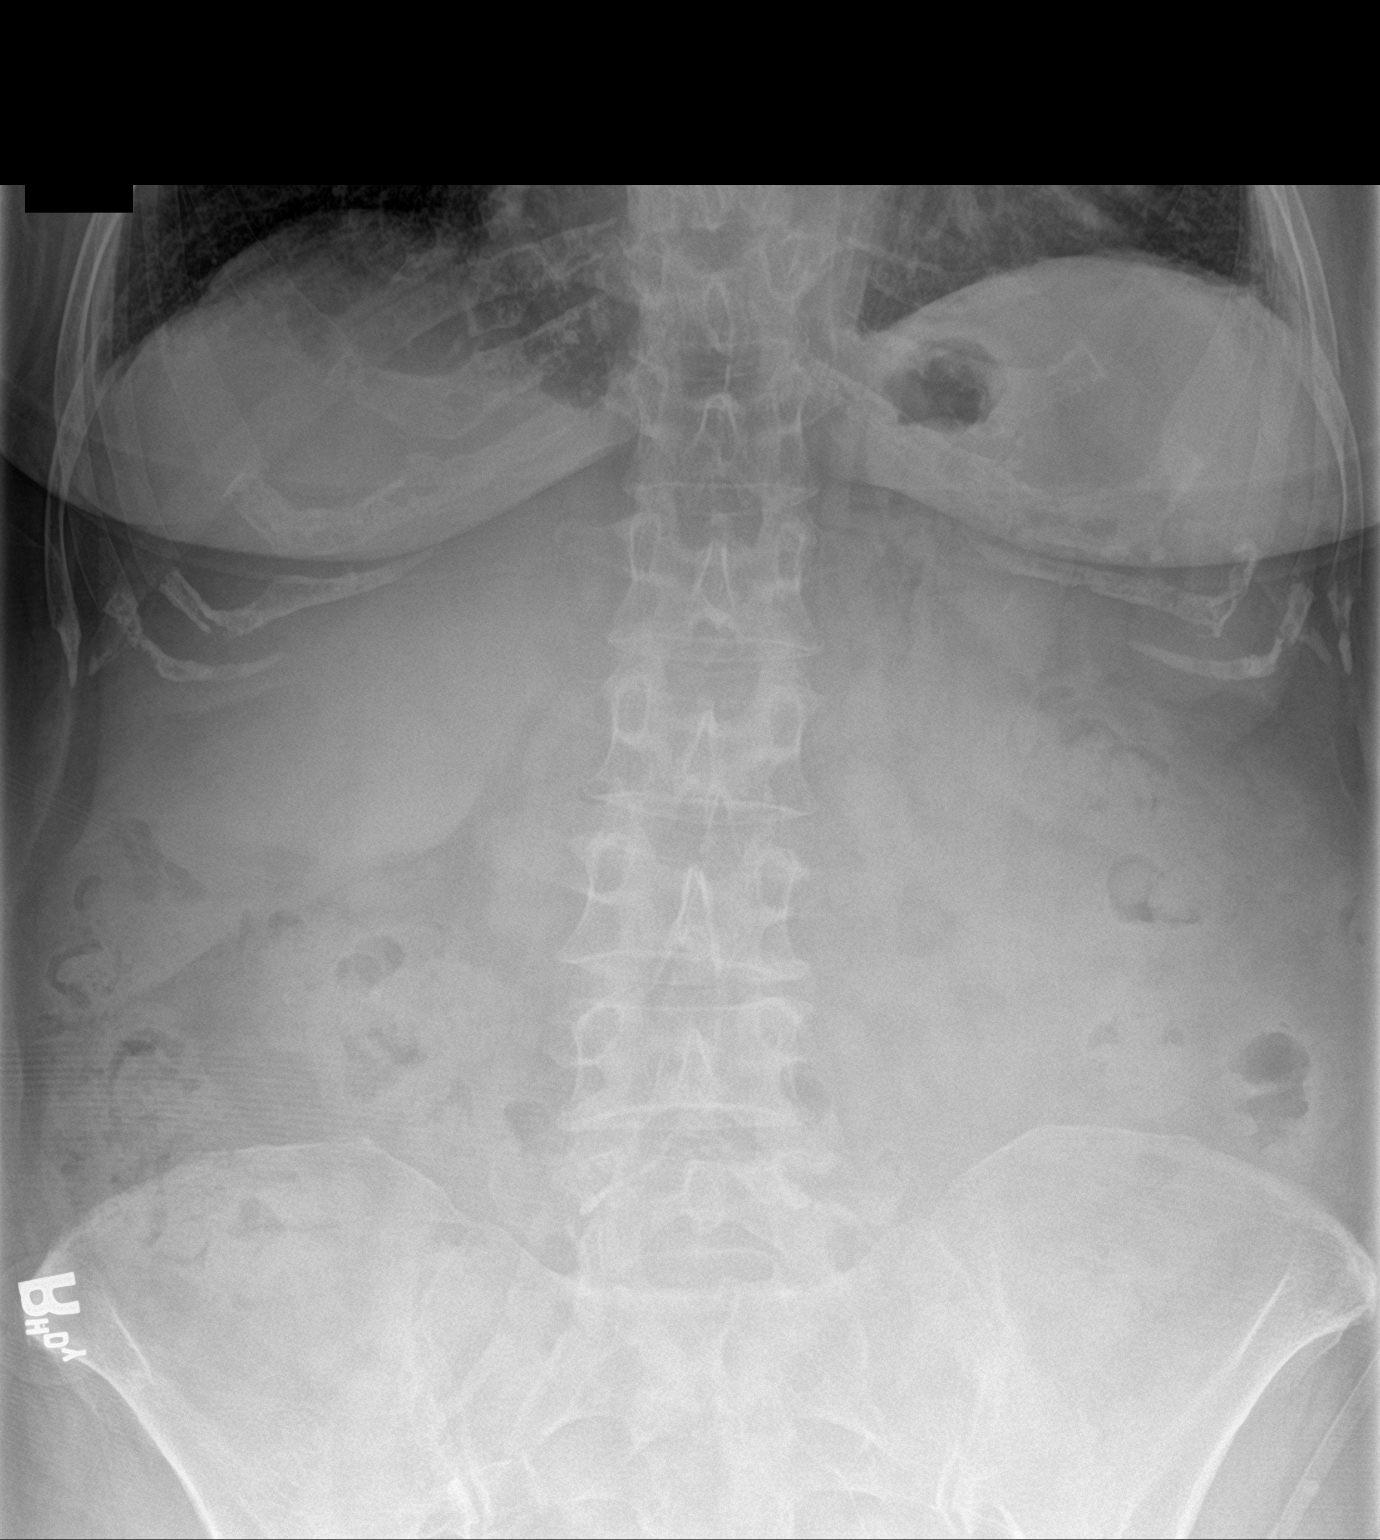

[1 of 1 positions shown; findings below may reference images not displayed]

FINDINGS: The dialysis catheter is below the lower limits of today's exam.

Visualized bowel gas pattern is nonobstructive. No evidence of free
intraperitoneal air seen. No radiopaque calculi. No acute or
suspicious osseous finding.
IMPRESSION: 1. No acute findings.  Nonobstructive bowel gas pattern.
2. Peritoneal dialysis catheter appreciated on yesterday's exam is
located below the lower limits of today's exam.

## 2019-05-14 DIAGNOSIS — Z992 Dependence on renal dialysis: Secondary | ICD-10-CM | POA: Diagnosis not present

## 2019-05-14 DIAGNOSIS — N186 End stage renal disease: Secondary | ICD-10-CM | POA: Diagnosis not present

## 2019-05-16 DIAGNOSIS — Z992 Dependence on renal dialysis: Secondary | ICD-10-CM | POA: Diagnosis not present

## 2019-05-16 DIAGNOSIS — N186 End stage renal disease: Secondary | ICD-10-CM | POA: Diagnosis not present

## 2019-05-19 DIAGNOSIS — Z992 Dependence on renal dialysis: Secondary | ICD-10-CM | POA: Diagnosis not present

## 2019-05-19 DIAGNOSIS — N186 End stage renal disease: Secondary | ICD-10-CM | POA: Diagnosis not present

## 2019-05-20 DIAGNOSIS — I1311 Hypertensive heart and chronic kidney disease without heart failure, with stage 5 chronic kidney disease, or end stage renal disease: Secondary | ICD-10-CM | POA: Diagnosis not present

## 2019-05-20 DIAGNOSIS — E1122 Type 2 diabetes mellitus with diabetic chronic kidney disease: Secondary | ICD-10-CM | POA: Diagnosis not present

## 2019-05-20 DIAGNOSIS — I2510 Atherosclerotic heart disease of native coronary artery without angina pectoris: Secondary | ICD-10-CM | POA: Diagnosis not present

## 2019-05-20 DIAGNOSIS — Z794 Long term (current) use of insulin: Secondary | ICD-10-CM | POA: Diagnosis not present

## 2019-05-20 DIAGNOSIS — N186 End stage renal disease: Secondary | ICD-10-CM | POA: Diagnosis not present

## 2019-05-20 DIAGNOSIS — N390 Urinary tract infection, site not specified: Secondary | ICD-10-CM | POA: Diagnosis not present

## 2019-05-20 DIAGNOSIS — Z992 Dependence on renal dialysis: Secondary | ICD-10-CM | POA: Diagnosis not present

## 2019-05-20 DIAGNOSIS — E1159 Type 2 diabetes mellitus with other circulatory complications: Secondary | ICD-10-CM | POA: Diagnosis not present

## 2019-05-20 DIAGNOSIS — I5022 Chronic systolic (congestive) heart failure: Secondary | ICD-10-CM | POA: Diagnosis not present

## 2019-05-21 DIAGNOSIS — Z992 Dependence on renal dialysis: Secondary | ICD-10-CM | POA: Diagnosis not present

## 2019-05-21 DIAGNOSIS — N186 End stage renal disease: Secondary | ICD-10-CM | POA: Diagnosis not present

## 2019-05-23 DIAGNOSIS — E119 Type 2 diabetes mellitus without complications: Secondary | ICD-10-CM | POA: Diagnosis not present

## 2019-05-23 DIAGNOSIS — N186 End stage renal disease: Secondary | ICD-10-CM | POA: Diagnosis not present

## 2019-05-23 DIAGNOSIS — Z992 Dependence on renal dialysis: Secondary | ICD-10-CM | POA: Diagnosis not present

## 2019-05-23 DIAGNOSIS — Z794 Long term (current) use of insulin: Secondary | ICD-10-CM | POA: Diagnosis not present

## 2019-05-24 DIAGNOSIS — Z992 Dependence on renal dialysis: Secondary | ICD-10-CM | POA: Diagnosis not present

## 2019-05-24 DIAGNOSIS — N186 End stage renal disease: Secondary | ICD-10-CM | POA: Diagnosis not present

## 2019-05-25 DIAGNOSIS — N186 End stage renal disease: Secondary | ICD-10-CM | POA: Diagnosis not present

## 2019-05-25 DIAGNOSIS — Z992 Dependence on renal dialysis: Secondary | ICD-10-CM | POA: Diagnosis not present

## 2019-05-26 DIAGNOSIS — N186 End stage renal disease: Secondary | ICD-10-CM | POA: Diagnosis not present

## 2019-05-26 DIAGNOSIS — Z992 Dependence on renal dialysis: Secondary | ICD-10-CM | POA: Diagnosis not present

## 2019-05-28 DIAGNOSIS — N186 End stage renal disease: Secondary | ICD-10-CM | POA: Diagnosis not present

## 2019-05-28 DIAGNOSIS — Z992 Dependence on renal dialysis: Secondary | ICD-10-CM | POA: Diagnosis not present

## 2019-05-30 DIAGNOSIS — N186 End stage renal disease: Secondary | ICD-10-CM | POA: Diagnosis not present

## 2019-05-30 DIAGNOSIS — Z992 Dependence on renal dialysis: Secondary | ICD-10-CM | POA: Diagnosis not present

## 2019-06-02 DIAGNOSIS — Z992 Dependence on renal dialysis: Secondary | ICD-10-CM | POA: Diagnosis not present

## 2019-06-02 DIAGNOSIS — N186 End stage renal disease: Secondary | ICD-10-CM | POA: Diagnosis not present

## 2019-06-04 DIAGNOSIS — N186 End stage renal disease: Secondary | ICD-10-CM | POA: Diagnosis not present

## 2019-06-04 DIAGNOSIS — Z992 Dependence on renal dialysis: Secondary | ICD-10-CM | POA: Diagnosis not present

## 2019-06-06 DIAGNOSIS — N186 End stage renal disease: Secondary | ICD-10-CM | POA: Diagnosis not present

## 2019-06-06 DIAGNOSIS — Z992 Dependence on renal dialysis: Secondary | ICD-10-CM | POA: Diagnosis not present

## 2019-06-09 DIAGNOSIS — Z992 Dependence on renal dialysis: Secondary | ICD-10-CM | POA: Diagnosis not present

## 2019-06-09 DIAGNOSIS — N186 End stage renal disease: Secondary | ICD-10-CM | POA: Diagnosis not present

## 2019-06-11 DIAGNOSIS — N186 End stage renal disease: Secondary | ICD-10-CM | POA: Diagnosis not present

## 2019-06-11 DIAGNOSIS — Z992 Dependence on renal dialysis: Secondary | ICD-10-CM | POA: Diagnosis not present

## 2019-06-13 DIAGNOSIS — Z992 Dependence on renal dialysis: Secondary | ICD-10-CM | POA: Diagnosis not present

## 2019-06-13 DIAGNOSIS — N186 End stage renal disease: Secondary | ICD-10-CM | POA: Diagnosis not present

## 2019-06-15 DIAGNOSIS — Z992 Dependence on renal dialysis: Secondary | ICD-10-CM | POA: Diagnosis not present

## 2019-06-15 DIAGNOSIS — N186 End stage renal disease: Secondary | ICD-10-CM | POA: Diagnosis not present

## 2019-06-17 DIAGNOSIS — Z992 Dependence on renal dialysis: Secondary | ICD-10-CM | POA: Diagnosis not present

## 2019-06-17 DIAGNOSIS — N186 End stage renal disease: Secondary | ICD-10-CM | POA: Diagnosis not present

## 2019-06-20 DIAGNOSIS — Z992 Dependence on renal dialysis: Secondary | ICD-10-CM | POA: Diagnosis not present

## 2019-06-20 DIAGNOSIS — N186 End stage renal disease: Secondary | ICD-10-CM | POA: Diagnosis not present

## 2019-06-23 DIAGNOSIS — Z992 Dependence on renal dialysis: Secondary | ICD-10-CM | POA: Diagnosis not present

## 2019-06-23 DIAGNOSIS — N186 End stage renal disease: Secondary | ICD-10-CM | POA: Diagnosis not present

## 2019-06-24 DIAGNOSIS — N186 End stage renal disease: Secondary | ICD-10-CM | POA: Diagnosis not present

## 2019-06-24 DIAGNOSIS — Z992 Dependence on renal dialysis: Secondary | ICD-10-CM | POA: Diagnosis not present

## 2019-06-25 DIAGNOSIS — Z992 Dependence on renal dialysis: Secondary | ICD-10-CM | POA: Diagnosis not present

## 2019-06-25 DIAGNOSIS — N186 End stage renal disease: Secondary | ICD-10-CM | POA: Diagnosis not present

## 2019-06-27 DIAGNOSIS — Z992 Dependence on renal dialysis: Secondary | ICD-10-CM | POA: Diagnosis not present

## 2019-06-27 DIAGNOSIS — N186 End stage renal disease: Secondary | ICD-10-CM | POA: Diagnosis not present

## 2019-06-30 DIAGNOSIS — N186 End stage renal disease: Secondary | ICD-10-CM | POA: Diagnosis not present

## 2019-06-30 DIAGNOSIS — Z992 Dependence on renal dialysis: Secondary | ICD-10-CM | POA: Diagnosis not present

## 2019-07-02 DIAGNOSIS — N186 End stage renal disease: Secondary | ICD-10-CM | POA: Diagnosis not present

## 2019-07-02 DIAGNOSIS — Z992 Dependence on renal dialysis: Secondary | ICD-10-CM | POA: Diagnosis not present

## 2019-07-04 DIAGNOSIS — N186 End stage renal disease: Secondary | ICD-10-CM | POA: Diagnosis not present

## 2019-07-04 DIAGNOSIS — Z992 Dependence on renal dialysis: Secondary | ICD-10-CM | POA: Diagnosis not present

## 2019-07-07 DIAGNOSIS — Z992 Dependence on renal dialysis: Secondary | ICD-10-CM | POA: Diagnosis not present

## 2019-07-07 DIAGNOSIS — N186 End stage renal disease: Secondary | ICD-10-CM | POA: Diagnosis not present

## 2019-07-09 DIAGNOSIS — Z992 Dependence on renal dialysis: Secondary | ICD-10-CM | POA: Diagnosis not present

## 2019-07-09 DIAGNOSIS — N186 End stage renal disease: Secondary | ICD-10-CM | POA: Diagnosis not present

## 2019-07-11 DIAGNOSIS — Z794 Long term (current) use of insulin: Secondary | ICD-10-CM | POA: Diagnosis not present

## 2019-07-11 DIAGNOSIS — N186 End stage renal disease: Secondary | ICD-10-CM | POA: Diagnosis not present

## 2019-07-11 DIAGNOSIS — E1122 Type 2 diabetes mellitus with diabetic chronic kidney disease: Secondary | ICD-10-CM | POA: Diagnosis not present

## 2019-07-11 DIAGNOSIS — Z992 Dependence on renal dialysis: Secondary | ICD-10-CM | POA: Diagnosis not present

## 2019-07-11 DIAGNOSIS — E113511 Type 2 diabetes mellitus with proliferative diabetic retinopathy with macular edema, right eye: Secondary | ICD-10-CM | POA: Diagnosis not present

## 2019-07-11 DIAGNOSIS — E113412 Type 2 diabetes mellitus with severe nonproliferative diabetic retinopathy with macular edema, left eye: Secondary | ICD-10-CM | POA: Diagnosis not present

## 2019-07-12 DIAGNOSIS — Z992 Dependence on renal dialysis: Secondary | ICD-10-CM | POA: Diagnosis not present

## 2019-07-12 DIAGNOSIS — N186 End stage renal disease: Secondary | ICD-10-CM | POA: Diagnosis not present

## 2019-07-14 DIAGNOSIS — Z992 Dependence on renal dialysis: Secondary | ICD-10-CM | POA: Diagnosis not present

## 2019-07-14 DIAGNOSIS — N186 End stage renal disease: Secondary | ICD-10-CM | POA: Diagnosis not present

## 2019-07-16 DIAGNOSIS — N186 End stage renal disease: Secondary | ICD-10-CM | POA: Diagnosis not present

## 2019-07-16 DIAGNOSIS — Z992 Dependence on renal dialysis: Secondary | ICD-10-CM | POA: Diagnosis not present

## 2019-07-19 DIAGNOSIS — N186 End stage renal disease: Secondary | ICD-10-CM | POA: Diagnosis not present

## 2019-07-19 DIAGNOSIS — Z992 Dependence on renal dialysis: Secondary | ICD-10-CM | POA: Diagnosis not present

## 2019-07-21 DIAGNOSIS — Z992 Dependence on renal dialysis: Secondary | ICD-10-CM | POA: Diagnosis not present

## 2019-07-21 DIAGNOSIS — N186 End stage renal disease: Secondary | ICD-10-CM | POA: Diagnosis not present

## 2019-07-23 DIAGNOSIS — Z992 Dependence on renal dialysis: Secondary | ICD-10-CM | POA: Diagnosis not present

## 2019-07-23 DIAGNOSIS — N186 End stage renal disease: Secondary | ICD-10-CM | POA: Diagnosis not present

## 2019-07-24 DIAGNOSIS — Z992 Dependence on renal dialysis: Secondary | ICD-10-CM | POA: Diagnosis not present

## 2019-07-24 DIAGNOSIS — N186 End stage renal disease: Secondary | ICD-10-CM | POA: Diagnosis not present

## 2019-07-25 DIAGNOSIS — N186 End stage renal disease: Secondary | ICD-10-CM | POA: Diagnosis not present

## 2019-07-25 DIAGNOSIS — Z992 Dependence on renal dialysis: Secondary | ICD-10-CM | POA: Diagnosis not present

## 2019-07-28 DIAGNOSIS — Z992 Dependence on renal dialysis: Secondary | ICD-10-CM | POA: Diagnosis not present

## 2019-07-28 DIAGNOSIS — N186 End stage renal disease: Secondary | ICD-10-CM | POA: Diagnosis not present

## 2019-07-30 DIAGNOSIS — Z992 Dependence on renal dialysis: Secondary | ICD-10-CM | POA: Diagnosis not present

## 2019-07-30 DIAGNOSIS — N186 End stage renal disease: Secondary | ICD-10-CM | POA: Diagnosis not present

## 2019-08-01 DIAGNOSIS — Z992 Dependence on renal dialysis: Secondary | ICD-10-CM | POA: Diagnosis not present

## 2019-08-01 DIAGNOSIS — N186 End stage renal disease: Secondary | ICD-10-CM | POA: Diagnosis not present

## 2019-08-04 DIAGNOSIS — N186 End stage renal disease: Secondary | ICD-10-CM | POA: Diagnosis not present

## 2019-08-04 DIAGNOSIS — Z992 Dependence on renal dialysis: Secondary | ICD-10-CM | POA: Diagnosis not present

## 2019-08-06 DIAGNOSIS — Z992 Dependence on renal dialysis: Secondary | ICD-10-CM | POA: Diagnosis not present

## 2019-08-06 DIAGNOSIS — N186 End stage renal disease: Secondary | ICD-10-CM | POA: Diagnosis not present

## 2019-08-08 DIAGNOSIS — N186 End stage renal disease: Secondary | ICD-10-CM | POA: Diagnosis not present

## 2019-08-08 DIAGNOSIS — Z992 Dependence on renal dialysis: Secondary | ICD-10-CM | POA: Diagnosis not present

## 2019-08-11 DIAGNOSIS — N186 End stage renal disease: Secondary | ICD-10-CM | POA: Diagnosis not present

## 2019-08-11 DIAGNOSIS — Z992 Dependence on renal dialysis: Secondary | ICD-10-CM | POA: Diagnosis not present

## 2019-08-13 DIAGNOSIS — Z992 Dependence on renal dialysis: Secondary | ICD-10-CM | POA: Diagnosis not present

## 2019-08-13 DIAGNOSIS — N186 End stage renal disease: Secondary | ICD-10-CM | POA: Diagnosis not present

## 2019-08-14 DIAGNOSIS — E113413 Type 2 diabetes mellitus with severe nonproliferative diabetic retinopathy with macular edema, bilateral: Secondary | ICD-10-CM | POA: Diagnosis not present

## 2019-08-15 DIAGNOSIS — N186 End stage renal disease: Secondary | ICD-10-CM | POA: Diagnosis not present

## 2019-08-15 DIAGNOSIS — Z992 Dependence on renal dialysis: Secondary | ICD-10-CM | POA: Diagnosis not present

## 2019-08-18 DIAGNOSIS — N186 End stage renal disease: Secondary | ICD-10-CM | POA: Diagnosis not present

## 2019-08-18 DIAGNOSIS — E119 Type 2 diabetes mellitus without complications: Secondary | ICD-10-CM | POA: Diagnosis not present

## 2019-08-18 DIAGNOSIS — Z992 Dependence on renal dialysis: Secondary | ICD-10-CM | POA: Diagnosis not present

## 2019-08-20 DIAGNOSIS — Z992 Dependence on renal dialysis: Secondary | ICD-10-CM | POA: Diagnosis not present

## 2019-08-20 DIAGNOSIS — N186 End stage renal disease: Secondary | ICD-10-CM | POA: Diagnosis not present

## 2019-08-22 DIAGNOSIS — N186 End stage renal disease: Secondary | ICD-10-CM | POA: Diagnosis not present

## 2019-08-22 DIAGNOSIS — Z992 Dependence on renal dialysis: Secondary | ICD-10-CM | POA: Diagnosis not present

## 2019-08-23 ENCOUNTER — Ambulatory Visit: Admit: 2019-08-23 | Discharge: 2019-08-24 | Payer: MEDICARE

## 2019-08-23 DIAGNOSIS — Z01812 Encounter for preprocedural laboratory examination: Secondary | ICD-10-CM | POA: Diagnosis not present

## 2019-08-23 DIAGNOSIS — Z20822 Contact with and (suspected) exposure to covid-19: Secondary | ICD-10-CM | POA: Diagnosis not present

## 2019-08-24 DIAGNOSIS — Z992 Dependence on renal dialysis: Secondary | ICD-10-CM | POA: Diagnosis not present

## 2019-08-24 DIAGNOSIS — N186 End stage renal disease: Secondary | ICD-10-CM | POA: Diagnosis not present

## 2019-08-25 DIAGNOSIS — N186 End stage renal disease: Secondary | ICD-10-CM | POA: Diagnosis not present

## 2019-08-25 DIAGNOSIS — Z992 Dependence on renal dialysis: Secondary | ICD-10-CM | POA: Diagnosis not present

## 2019-08-26 ENCOUNTER — Encounter: Admit: 2019-08-26 | Discharge: 2019-08-26 | Payer: MEDICARE | Attending: Anesthesiology | Primary: Anesthesiology

## 2019-08-26 ENCOUNTER — Ambulatory Visit: Admit: 2019-08-26 | Discharge: 2019-08-26 | Payer: MEDICARE

## 2019-08-26 DIAGNOSIS — K659 Peritonitis, unspecified: Secondary | ICD-10-CM | POA: Diagnosis not present

## 2019-08-26 DIAGNOSIS — I110 Hypertensive heart disease with heart failure: Secondary | ICD-10-CM | POA: Diagnosis not present

## 2019-08-26 DIAGNOSIS — Z794 Long term (current) use of insulin: Secondary | ICD-10-CM | POA: Diagnosis not present

## 2019-08-26 DIAGNOSIS — Z4902 Encounter for fitting and adjustment of peritoneal dialysis catheter: Secondary | ICD-10-CM | POA: Diagnosis not present

## 2019-08-26 DIAGNOSIS — I2510 Atherosclerotic heart disease of native coronary artery without angina pectoris: Secondary | ICD-10-CM | POA: Diagnosis not present

## 2019-08-26 DIAGNOSIS — I120 Hypertensive chronic kidney disease with stage 5 chronic kidney disease or end stage renal disease: Secondary | ICD-10-CM | POA: Diagnosis not present

## 2019-08-26 DIAGNOSIS — T85611A Breakdown (mechanical) of intraperitoneal dialysis catheter, initial encounter: Secondary | ICD-10-CM | POA: Diagnosis not present

## 2019-08-26 DIAGNOSIS — N186 End stage renal disease: Secondary | ICD-10-CM | POA: Diagnosis not present

## 2019-08-26 DIAGNOSIS — Z955 Presence of coronary angioplasty implant and graft: Secondary | ICD-10-CM | POA: Diagnosis not present

## 2019-08-26 DIAGNOSIS — E1122 Type 2 diabetes mellitus with diabetic chronic kidney disease: Secondary | ICD-10-CM | POA: Diagnosis not present

## 2019-08-26 DIAGNOSIS — Z466 Encounter for fitting and adjustment of urinary device: Secondary | ICD-10-CM | POA: Diagnosis not present

## 2019-08-26 MED ORDER — OXYCODONE 5 MG TABLET
ORAL_TABLET | Freq: Four times a day (QID) | ORAL | 0 refills | 2.00000 days | Status: CP | PRN
Start: 2019-08-26 — End: ?

## 2019-08-27 DIAGNOSIS — N186 End stage renal disease: Secondary | ICD-10-CM | POA: Diagnosis not present

## 2019-08-27 DIAGNOSIS — Z992 Dependence on renal dialysis: Secondary | ICD-10-CM | POA: Diagnosis not present

## 2019-08-29 DIAGNOSIS — Z992 Dependence on renal dialysis: Secondary | ICD-10-CM | POA: Diagnosis not present

## 2019-08-29 DIAGNOSIS — N186 End stage renal disease: Secondary | ICD-10-CM | POA: Diagnosis not present

## 2019-09-01 DIAGNOSIS — N186 End stage renal disease: Secondary | ICD-10-CM | POA: Diagnosis not present

## 2019-09-01 DIAGNOSIS — Z992 Dependence on renal dialysis: Secondary | ICD-10-CM | POA: Diagnosis not present

## 2019-09-03 DIAGNOSIS — N186 End stage renal disease: Secondary | ICD-10-CM | POA: Diagnosis not present

## 2019-09-03 DIAGNOSIS — Z992 Dependence on renal dialysis: Secondary | ICD-10-CM | POA: Diagnosis not present

## 2019-09-05 DIAGNOSIS — N186 End stage renal disease: Secondary | ICD-10-CM | POA: Diagnosis not present

## 2019-09-05 DIAGNOSIS — Z992 Dependence on renal dialysis: Secondary | ICD-10-CM | POA: Diagnosis not present

## 2019-09-08 DIAGNOSIS — N186 End stage renal disease: Secondary | ICD-10-CM | POA: Diagnosis not present

## 2019-09-08 DIAGNOSIS — Z992 Dependence on renal dialysis: Secondary | ICD-10-CM | POA: Diagnosis not present

## 2019-09-10 DIAGNOSIS — Z992 Dependence on renal dialysis: Secondary | ICD-10-CM | POA: Diagnosis not present

## 2019-09-10 DIAGNOSIS — N186 End stage renal disease: Secondary | ICD-10-CM | POA: Diagnosis not present

## 2019-09-12 DIAGNOSIS — N186 End stage renal disease: Secondary | ICD-10-CM | POA: Diagnosis not present

## 2019-09-12 DIAGNOSIS — Z992 Dependence on renal dialysis: Secondary | ICD-10-CM | POA: Diagnosis not present

## 2019-09-15 DIAGNOSIS — Z992 Dependence on renal dialysis: Secondary | ICD-10-CM | POA: Diagnosis not present

## 2019-09-15 DIAGNOSIS — N186 End stage renal disease: Secondary | ICD-10-CM | POA: Diagnosis not present

## 2019-09-17 DIAGNOSIS — N186 End stage renal disease: Secondary | ICD-10-CM | POA: Diagnosis not present

## 2019-09-17 DIAGNOSIS — Z992 Dependence on renal dialysis: Secondary | ICD-10-CM | POA: Diagnosis not present

## 2019-09-18 DIAGNOSIS — R8290 Unspecified abnormal findings in urine: Secondary | ICD-10-CM | POA: Diagnosis not present

## 2019-09-18 DIAGNOSIS — I10 Essential (primary) hypertension: Secondary | ICD-10-CM | POA: Diagnosis not present

## 2019-09-18 DIAGNOSIS — I5022 Chronic systolic (congestive) heart failure: Secondary | ICD-10-CM | POA: Diagnosis not present

## 2019-09-18 DIAGNOSIS — G63 Polyneuropathy in diseases classified elsewhere: Secondary | ICD-10-CM | POA: Diagnosis not present

## 2019-09-18 DIAGNOSIS — E1122 Type 2 diabetes mellitus with diabetic chronic kidney disease: Secondary | ICD-10-CM | POA: Diagnosis not present

## 2019-09-18 DIAGNOSIS — E1159 Type 2 diabetes mellitus with other circulatory complications: Secondary | ICD-10-CM | POA: Diagnosis not present

## 2019-09-18 DIAGNOSIS — T82858A Stenosis of vascular prosthetic devices, implants and grafts, initial encounter: Secondary | ICD-10-CM | POA: Diagnosis not present

## 2019-09-18 DIAGNOSIS — I871 Compression of vein: Secondary | ICD-10-CM | POA: Diagnosis not present

## 2019-09-18 DIAGNOSIS — N390 Urinary tract infection, site not specified: Secondary | ICD-10-CM | POA: Diagnosis not present

## 2019-09-18 DIAGNOSIS — E039 Hypothyroidism, unspecified: Secondary | ICD-10-CM | POA: Diagnosis not present

## 2019-09-18 DIAGNOSIS — Z955 Presence of coronary angioplasty implant and graft: Secondary | ICD-10-CM | POA: Diagnosis not present

## 2019-09-18 DIAGNOSIS — I2510 Atherosclerotic heart disease of native coronary artery without angina pectoris: Secondary | ICD-10-CM | POA: Diagnosis not present

## 2019-09-19 DIAGNOSIS — N186 End stage renal disease: Secondary | ICD-10-CM | POA: Diagnosis not present

## 2019-09-19 DIAGNOSIS — Z992 Dependence on renal dialysis: Secondary | ICD-10-CM | POA: Diagnosis not present

## 2019-09-21 DIAGNOSIS — Z992 Dependence on renal dialysis: Secondary | ICD-10-CM | POA: Diagnosis not present

## 2019-09-21 DIAGNOSIS — N186 End stage renal disease: Secondary | ICD-10-CM | POA: Diagnosis not present

## 2019-09-22 DIAGNOSIS — Z992 Dependence on renal dialysis: Secondary | ICD-10-CM | POA: Diagnosis not present

## 2019-09-22 DIAGNOSIS — N186 End stage renal disease: Secondary | ICD-10-CM | POA: Diagnosis not present

## 2019-09-23 DIAGNOSIS — E1122 Type 2 diabetes mellitus with diabetic chronic kidney disease: Secondary | ICD-10-CM | POA: Diagnosis not present

## 2019-09-23 DIAGNOSIS — H9193 Unspecified hearing loss, bilateral: Secondary | ICD-10-CM | POA: Diagnosis not present

## 2019-09-23 DIAGNOSIS — Z794 Long term (current) use of insulin: Secondary | ICD-10-CM | POA: Diagnosis not present

## 2019-09-23 DIAGNOSIS — E1142 Type 2 diabetes mellitus with diabetic polyneuropathy: Secondary | ICD-10-CM | POA: Diagnosis not present

## 2019-09-23 DIAGNOSIS — I5022 Chronic systolic (congestive) heart failure: Secondary | ICD-10-CM | POA: Diagnosis not present

## 2019-09-23 DIAGNOSIS — I110 Hypertensive heart disease with heart failure: Secondary | ICD-10-CM | POA: Diagnosis not present

## 2019-09-23 DIAGNOSIS — H6123 Impacted cerumen, bilateral: Secondary | ICD-10-CM | POA: Diagnosis not present

## 2019-09-23 DIAGNOSIS — Z992 Dependence on renal dialysis: Secondary | ICD-10-CM | POA: Diagnosis not present

## 2019-09-23 DIAGNOSIS — Z0000 Encounter for general adult medical examination without abnormal findings: Secondary | ICD-10-CM | POA: Diagnosis not present

## 2019-09-23 DIAGNOSIS — N186 End stage renal disease: Secondary | ICD-10-CM | POA: Diagnosis not present

## 2019-09-24 DIAGNOSIS — N186 End stage renal disease: Secondary | ICD-10-CM | POA: Diagnosis not present

## 2019-09-24 DIAGNOSIS — Z992 Dependence on renal dialysis: Secondary | ICD-10-CM | POA: Diagnosis not present

## 2019-09-26 DIAGNOSIS — Z992 Dependence on renal dialysis: Secondary | ICD-10-CM | POA: Diagnosis not present

## 2019-09-26 DIAGNOSIS — N186 End stage renal disease: Secondary | ICD-10-CM | POA: Diagnosis not present

## 2019-09-29 DIAGNOSIS — N186 End stage renal disease: Secondary | ICD-10-CM | POA: Diagnosis not present

## 2019-09-29 DIAGNOSIS — Z992 Dependence on renal dialysis: Secondary | ICD-10-CM | POA: Diagnosis not present

## 2019-09-30 DIAGNOSIS — H903 Sensorineural hearing loss, bilateral: Secondary | ICD-10-CM | POA: Diagnosis not present

## 2019-10-01 DIAGNOSIS — N186 End stage renal disease: Secondary | ICD-10-CM | POA: Diagnosis not present

## 2019-10-01 DIAGNOSIS — Z992 Dependence on renal dialysis: Secondary | ICD-10-CM | POA: Diagnosis not present

## 2019-10-03 DIAGNOSIS — N186 End stage renal disease: Secondary | ICD-10-CM | POA: Diagnosis not present

## 2019-10-03 DIAGNOSIS — Z992 Dependence on renal dialysis: Secondary | ICD-10-CM | POA: Diagnosis not present

## 2019-10-06 DIAGNOSIS — Z992 Dependence on renal dialysis: Secondary | ICD-10-CM | POA: Diagnosis not present

## 2019-10-06 DIAGNOSIS — N186 End stage renal disease: Secondary | ICD-10-CM | POA: Diagnosis not present

## 2019-10-08 DIAGNOSIS — N186 End stage renal disease: Secondary | ICD-10-CM | POA: Diagnosis not present

## 2019-10-08 DIAGNOSIS — Z992 Dependence on renal dialysis: Secondary | ICD-10-CM | POA: Diagnosis not present

## 2019-10-10 DIAGNOSIS — Z992 Dependence on renal dialysis: Secondary | ICD-10-CM | POA: Diagnosis not present

## 2019-10-10 DIAGNOSIS — N186 End stage renal disease: Secondary | ICD-10-CM | POA: Diagnosis not present

## 2019-10-13 DIAGNOSIS — Z992 Dependence on renal dialysis: Secondary | ICD-10-CM | POA: Diagnosis not present

## 2019-10-13 DIAGNOSIS — N186 End stage renal disease: Secondary | ICD-10-CM | POA: Diagnosis not present

## 2019-10-15 DIAGNOSIS — N186 End stage renal disease: Secondary | ICD-10-CM | POA: Diagnosis not present

## 2019-10-15 DIAGNOSIS — Z992 Dependence on renal dialysis: Secondary | ICD-10-CM | POA: Diagnosis not present

## 2019-10-17 DIAGNOSIS — Z992 Dependence on renal dialysis: Secondary | ICD-10-CM | POA: Diagnosis not present

## 2019-10-17 DIAGNOSIS — N186 End stage renal disease: Secondary | ICD-10-CM | POA: Diagnosis not present

## 2019-10-20 DIAGNOSIS — Z992 Dependence on renal dialysis: Secondary | ICD-10-CM | POA: Diagnosis not present

## 2019-10-20 DIAGNOSIS — N186 End stage renal disease: Secondary | ICD-10-CM | POA: Diagnosis not present

## 2019-10-22 DIAGNOSIS — Z992 Dependence on renal dialysis: Secondary | ICD-10-CM | POA: Diagnosis not present

## 2019-10-22 DIAGNOSIS — N186 End stage renal disease: Secondary | ICD-10-CM | POA: Diagnosis not present

## 2019-10-23 DIAGNOSIS — T82898A Other specified complication of vascular prosthetic devices, implants and grafts, initial encounter: Secondary | ICD-10-CM | POA: Diagnosis not present

## 2019-10-23 DIAGNOSIS — Z992 Dependence on renal dialysis: Secondary | ICD-10-CM | POA: Diagnosis not present

## 2019-10-23 DIAGNOSIS — N186 End stage renal disease: Secondary | ICD-10-CM | POA: Diagnosis not present

## 2019-10-24 DIAGNOSIS — Z992 Dependence on renal dialysis: Secondary | ICD-10-CM | POA: Diagnosis not present

## 2019-10-24 DIAGNOSIS — N186 End stage renal disease: Secondary | ICD-10-CM | POA: Diagnosis not present

## 2019-10-27 DIAGNOSIS — N186 End stage renal disease: Secondary | ICD-10-CM | POA: Diagnosis not present

## 2019-10-27 DIAGNOSIS — Z992 Dependence on renal dialysis: Secondary | ICD-10-CM | POA: Diagnosis not present

## 2019-10-29 DIAGNOSIS — N186 End stage renal disease: Secondary | ICD-10-CM | POA: Diagnosis not present

## 2019-10-29 DIAGNOSIS — Z992 Dependence on renal dialysis: Secondary | ICD-10-CM | POA: Diagnosis not present

## 2019-10-31 DIAGNOSIS — N186 End stage renal disease: Secondary | ICD-10-CM | POA: Diagnosis not present

## 2019-10-31 DIAGNOSIS — Z992 Dependence on renal dialysis: Secondary | ICD-10-CM | POA: Diagnosis not present

## 2019-11-03 DIAGNOSIS — N186 End stage renal disease: Secondary | ICD-10-CM | POA: Diagnosis not present

## 2019-11-03 DIAGNOSIS — Z992 Dependence on renal dialysis: Secondary | ICD-10-CM | POA: Diagnosis not present

## 2019-11-05 DIAGNOSIS — N186 End stage renal disease: Secondary | ICD-10-CM | POA: Diagnosis not present

## 2019-11-05 DIAGNOSIS — Z992 Dependence on renal dialysis: Secondary | ICD-10-CM | POA: Diagnosis not present

## 2019-11-06 DIAGNOSIS — N186 End stage renal disease: Secondary | ICD-10-CM | POA: Diagnosis not present

## 2019-11-06 DIAGNOSIS — Z992 Dependence on renal dialysis: Secondary | ICD-10-CM | POA: Diagnosis not present

## 2019-11-07 DIAGNOSIS — N186 End stage renal disease: Secondary | ICD-10-CM | POA: Diagnosis not present

## 2019-11-07 DIAGNOSIS — Z992 Dependence on renal dialysis: Secondary | ICD-10-CM | POA: Diagnosis not present

## 2019-11-10 DIAGNOSIS — Z992 Dependence on renal dialysis: Secondary | ICD-10-CM | POA: Diagnosis not present

## 2019-11-10 DIAGNOSIS — N186 End stage renal disease: Secondary | ICD-10-CM | POA: Diagnosis not present

## 2019-11-11 DIAGNOSIS — E113511 Type 2 diabetes mellitus with proliferative diabetic retinopathy with macular edema, right eye: Secondary | ICD-10-CM | POA: Diagnosis not present

## 2019-11-11 DIAGNOSIS — I10 Essential (primary) hypertension: Secondary | ICD-10-CM | POA: Diagnosis not present

## 2019-11-11 DIAGNOSIS — E1142 Type 2 diabetes mellitus with diabetic polyneuropathy: Secondary | ICD-10-CM | POA: Diagnosis not present

## 2019-11-11 DIAGNOSIS — N186 End stage renal disease: Secondary | ICD-10-CM | POA: Diagnosis not present

## 2019-11-11 DIAGNOSIS — E1122 Type 2 diabetes mellitus with diabetic chronic kidney disease: Secondary | ICD-10-CM | POA: Diagnosis not present

## 2019-11-11 DIAGNOSIS — E113412 Type 2 diabetes mellitus with severe nonproliferative diabetic retinopathy with macular edema, left eye: Secondary | ICD-10-CM | POA: Diagnosis not present

## 2019-11-11 DIAGNOSIS — E1159 Type 2 diabetes mellitus with other circulatory complications: Secondary | ICD-10-CM | POA: Diagnosis not present

## 2019-11-12 DIAGNOSIS — Z992 Dependence on renal dialysis: Secondary | ICD-10-CM | POA: Diagnosis not present

## 2019-11-12 DIAGNOSIS — N186 End stage renal disease: Secondary | ICD-10-CM | POA: Diagnosis not present

## 2019-11-14 DIAGNOSIS — Z992 Dependence on renal dialysis: Secondary | ICD-10-CM | POA: Diagnosis not present

## 2019-11-14 DIAGNOSIS — N186 End stage renal disease: Secondary | ICD-10-CM | POA: Diagnosis not present

## 2019-11-17 DIAGNOSIS — N186 End stage renal disease: Secondary | ICD-10-CM | POA: Diagnosis not present

## 2019-11-17 DIAGNOSIS — E119 Type 2 diabetes mellitus without complications: Secondary | ICD-10-CM | POA: Diagnosis not present

## 2019-11-17 DIAGNOSIS — Z992 Dependence on renal dialysis: Secondary | ICD-10-CM | POA: Diagnosis not present

## 2019-11-19 DIAGNOSIS — N186 End stage renal disease: Secondary | ICD-10-CM | POA: Diagnosis not present

## 2019-11-19 DIAGNOSIS — Z992 Dependence on renal dialysis: Secondary | ICD-10-CM | POA: Diagnosis not present

## 2019-11-21 ENCOUNTER — Other Ambulatory Visit
Admission: RE | Admit: 2019-11-21 | Discharge: 2019-11-21 | Disposition: A | Discharge status: Home / Self Care | Payer: Medicare HMO | Source: Ambulatory Visit | Attending: Student | Admitting: Student

## 2019-11-21 DIAGNOSIS — D631 Anemia in chronic kidney disease: Secondary | ICD-10-CM | POA: Insufficient documentation

## 2019-11-21 DIAGNOSIS — N186 End stage renal disease: Secondary | ICD-10-CM | POA: Diagnosis not present

## 2019-11-21 DIAGNOSIS — Z992 Dependence on renal dialysis: Secondary | ICD-10-CM | POA: Diagnosis not present

## 2019-11-21 LAB — HEMOGLOBIN: Hemoglobin: 7.7 g/dL — ABNORMAL LOW (ref 12.0–15.0)

## 2019-11-22 DIAGNOSIS — N186 End stage renal disease: Secondary | ICD-10-CM | POA: Diagnosis not present

## 2019-11-22 DIAGNOSIS — Z992 Dependence on renal dialysis: Secondary | ICD-10-CM | POA: Diagnosis not present

## 2019-11-24 DIAGNOSIS — N186 End stage renal disease: Secondary | ICD-10-CM | POA: Diagnosis not present

## 2019-11-24 DIAGNOSIS — Z992 Dependence on renal dialysis: Secondary | ICD-10-CM | POA: Diagnosis not present

## 2019-11-26 DIAGNOSIS — N186 End stage renal disease: Secondary | ICD-10-CM | POA: Diagnosis not present

## 2019-11-26 DIAGNOSIS — Z992 Dependence on renal dialysis: Secondary | ICD-10-CM | POA: Diagnosis not present

## 2019-11-28 DIAGNOSIS — Z992 Dependence on renal dialysis: Secondary | ICD-10-CM | POA: Diagnosis not present

## 2019-11-28 DIAGNOSIS — N186 End stage renal disease: Secondary | ICD-10-CM | POA: Diagnosis not present

## 2019-12-01 DIAGNOSIS — N186 End stage renal disease: Secondary | ICD-10-CM | POA: Diagnosis not present

## 2019-12-01 DIAGNOSIS — Z992 Dependence on renal dialysis: Secondary | ICD-10-CM | POA: Diagnosis not present

## 2019-12-03 DIAGNOSIS — N186 End stage renal disease: Secondary | ICD-10-CM | POA: Diagnosis not present

## 2019-12-03 DIAGNOSIS — Z992 Dependence on renal dialysis: Secondary | ICD-10-CM | POA: Diagnosis not present

## 2019-12-04 DIAGNOSIS — T82858A Stenosis of vascular prosthetic devices, implants and grafts, initial encounter: Secondary | ICD-10-CM | POA: Diagnosis not present

## 2019-12-04 DIAGNOSIS — I871 Compression of vein: Secondary | ICD-10-CM | POA: Diagnosis not present

## 2019-12-05 DIAGNOSIS — N186 End stage renal disease: Secondary | ICD-10-CM | POA: Diagnosis not present

## 2019-12-05 DIAGNOSIS — Z992 Dependence on renal dialysis: Secondary | ICD-10-CM | POA: Diagnosis not present

## 2019-12-08 DIAGNOSIS — Z992 Dependence on renal dialysis: Secondary | ICD-10-CM | POA: Diagnosis not present

## 2019-12-08 DIAGNOSIS — N186 End stage renal disease: Secondary | ICD-10-CM | POA: Diagnosis not present

## 2019-12-10 DIAGNOSIS — N186 End stage renal disease: Secondary | ICD-10-CM | POA: Diagnosis not present

## 2019-12-10 DIAGNOSIS — Z992 Dependence on renal dialysis: Secondary | ICD-10-CM | POA: Diagnosis not present

## 2019-12-12 DIAGNOSIS — N186 End stage renal disease: Secondary | ICD-10-CM | POA: Diagnosis not present

## 2019-12-12 DIAGNOSIS — Z992 Dependence on renal dialysis: Secondary | ICD-10-CM | POA: Diagnosis not present

## 2019-12-15 DIAGNOSIS — Z992 Dependence on renal dialysis: Secondary | ICD-10-CM | POA: Diagnosis not present

## 2019-12-15 DIAGNOSIS — N186 End stage renal disease: Secondary | ICD-10-CM | POA: Diagnosis not present

## 2019-12-17 DIAGNOSIS — Z992 Dependence on renal dialysis: Secondary | ICD-10-CM | POA: Diagnosis not present

## 2019-12-17 DIAGNOSIS — Z79899 Other long term (current) drug therapy: Secondary | ICD-10-CM | POA: Diagnosis not present

## 2019-12-17 DIAGNOSIS — N186 End stage renal disease: Secondary | ICD-10-CM | POA: Diagnosis not present

## 2019-12-19 DIAGNOSIS — N186 End stage renal disease: Secondary | ICD-10-CM | POA: Diagnosis not present

## 2019-12-19 DIAGNOSIS — Z992 Dependence on renal dialysis: Secondary | ICD-10-CM | POA: Diagnosis not present

## 2019-12-22 DIAGNOSIS — Z992 Dependence on renal dialysis: Secondary | ICD-10-CM | POA: Diagnosis not present

## 2019-12-22 DIAGNOSIS — N186 End stage renal disease: Secondary | ICD-10-CM | POA: Diagnosis not present

## 2019-12-23 DIAGNOSIS — N186 End stage renal disease: Secondary | ICD-10-CM | POA: Diagnosis not present

## 2019-12-23 DIAGNOSIS — Z992 Dependence on renal dialysis: Secondary | ICD-10-CM | POA: Diagnosis not present

## 2019-12-24 DIAGNOSIS — Z992 Dependence on renal dialysis: Secondary | ICD-10-CM | POA: Diagnosis not present

## 2019-12-24 DIAGNOSIS — N186 End stage renal disease: Secondary | ICD-10-CM | POA: Diagnosis not present

## 2019-12-26 DIAGNOSIS — Z992 Dependence on renal dialysis: Secondary | ICD-10-CM | POA: Diagnosis not present

## 2019-12-26 DIAGNOSIS — N186 End stage renal disease: Secondary | ICD-10-CM | POA: Diagnosis not present

## 2019-12-29 DIAGNOSIS — Z992 Dependence on renal dialysis: Secondary | ICD-10-CM | POA: Diagnosis not present

## 2019-12-29 DIAGNOSIS — N186 End stage renal disease: Secondary | ICD-10-CM | POA: Diagnosis not present

## 2019-12-30 DIAGNOSIS — H903 Sensorineural hearing loss, bilateral: Secondary | ICD-10-CM | POA: Diagnosis not present

## 2019-12-31 ENCOUNTER — Encounter: Admit: 2019-12-31 | Discharge: 2019-12-31 | Payer: MEDICARE | Attending: Internal Medicine | Primary: Internal Medicine

## 2019-12-31 DIAGNOSIS — N186 End stage renal disease: Secondary | ICD-10-CM | POA: Diagnosis not present

## 2019-12-31 DIAGNOSIS — Z992 Dependence on renal dialysis: Secondary | ICD-10-CM | POA: Diagnosis not present

## 2020-01-02 DIAGNOSIS — N186 End stage renal disease: Secondary | ICD-10-CM | POA: Diagnosis not present

## 2020-01-02 DIAGNOSIS — Z992 Dependence on renal dialysis: Secondary | ICD-10-CM | POA: Diagnosis not present

## 2020-01-02 DIAGNOSIS — Z7682 Awaiting organ transplant status: Principal | ICD-10-CM

## 2020-01-02 DIAGNOSIS — Z01818 Encounter for other preprocedural examination: Principal | ICD-10-CM

## 2020-01-05 DIAGNOSIS — Z992 Dependence on renal dialysis: Secondary | ICD-10-CM | POA: Diagnosis not present

## 2020-01-05 DIAGNOSIS — N186 End stage renal disease: Secondary | ICD-10-CM | POA: Diagnosis not present

## 2020-01-07 DIAGNOSIS — Z992 Dependence on renal dialysis: Secondary | ICD-10-CM | POA: Diagnosis not present

## 2020-01-07 DIAGNOSIS — N186 End stage renal disease: Secondary | ICD-10-CM | POA: Diagnosis not present

## 2020-01-08 DIAGNOSIS — E113413 Type 2 diabetes mellitus with severe nonproliferative diabetic retinopathy with macular edema, bilateral: Secondary | ICD-10-CM | POA: Diagnosis not present

## 2020-01-09 DIAGNOSIS — N186 End stage renal disease: Secondary | ICD-10-CM | POA: Diagnosis not present

## 2020-01-09 DIAGNOSIS — Z992 Dependence on renal dialysis: Secondary | ICD-10-CM | POA: Diagnosis not present

## 2020-01-12 DIAGNOSIS — Z992 Dependence on renal dialysis: Secondary | ICD-10-CM | POA: Diagnosis not present

## 2020-01-12 DIAGNOSIS — N186 End stage renal disease: Secondary | ICD-10-CM | POA: Diagnosis not present

## 2020-01-14 DIAGNOSIS — Z992 Dependence on renal dialysis: Secondary | ICD-10-CM | POA: Diagnosis not present

## 2020-01-14 DIAGNOSIS — N186 End stage renal disease: Secondary | ICD-10-CM | POA: Diagnosis not present

## 2020-01-16 DIAGNOSIS — N186 End stage renal disease: Secondary | ICD-10-CM | POA: Diagnosis not present

## 2020-01-16 DIAGNOSIS — Z992 Dependence on renal dialysis: Secondary | ICD-10-CM | POA: Diagnosis not present

## 2020-01-19 DIAGNOSIS — Z992 Dependence on renal dialysis: Secondary | ICD-10-CM | POA: Diagnosis not present

## 2020-01-19 DIAGNOSIS — N186 End stage renal disease: Secondary | ICD-10-CM | POA: Diagnosis not present

## 2020-01-21 DIAGNOSIS — N186 End stage renal disease: Secondary | ICD-10-CM | POA: Diagnosis not present

## 2020-01-21 DIAGNOSIS — Z992 Dependence on renal dialysis: Secondary | ICD-10-CM | POA: Diagnosis not present

## 2020-01-22 DIAGNOSIS — Z992 Dependence on renal dialysis: Secondary | ICD-10-CM | POA: Diagnosis not present

## 2020-01-22 DIAGNOSIS — N186 End stage renal disease: Secondary | ICD-10-CM | POA: Diagnosis not present

## 2020-01-23 DIAGNOSIS — Z992 Dependence on renal dialysis: Secondary | ICD-10-CM | POA: Diagnosis not present

## 2020-01-23 DIAGNOSIS — N186 End stage renal disease: Secondary | ICD-10-CM | POA: Diagnosis not present

## 2020-01-26 ENCOUNTER — Encounter: Admit: 2020-01-26 | Discharge: 2020-01-26 | Payer: MEDICARE | Attending: Internal Medicine | Primary: Internal Medicine

## 2020-01-26 DIAGNOSIS — Z992 Dependence on renal dialysis: Secondary | ICD-10-CM | POA: Diagnosis not present

## 2020-01-26 DIAGNOSIS — N186 End stage renal disease: Secondary | ICD-10-CM | POA: Diagnosis not present

## 2020-01-27 DIAGNOSIS — N186 End stage renal disease: Secondary | ICD-10-CM | POA: Diagnosis not present

## 2020-01-27 DIAGNOSIS — E1122 Type 2 diabetes mellitus with diabetic chronic kidney disease: Secondary | ICD-10-CM | POA: Diagnosis not present

## 2020-01-27 DIAGNOSIS — I10 Essential (primary) hypertension: Secondary | ICD-10-CM | POA: Diagnosis not present

## 2020-01-27 DIAGNOSIS — Z992 Dependence on renal dialysis: Secondary | ICD-10-CM | POA: Diagnosis not present

## 2020-01-27 DIAGNOSIS — I2510 Atherosclerotic heart disease of native coronary artery without angina pectoris: Secondary | ICD-10-CM | POA: Diagnosis not present

## 2020-01-27 DIAGNOSIS — R8290 Unspecified abnormal findings in urine: Secondary | ICD-10-CM | POA: Diagnosis not present

## 2020-01-27 DIAGNOSIS — Z794 Long term (current) use of insulin: Secondary | ICD-10-CM | POA: Diagnosis not present

## 2020-01-27 DIAGNOSIS — H9193 Unspecified hearing loss, bilateral: Secondary | ICD-10-CM | POA: Diagnosis not present

## 2020-01-27 DIAGNOSIS — E1142 Type 2 diabetes mellitus with diabetic polyneuropathy: Secondary | ICD-10-CM | POA: Diagnosis not present

## 2020-01-27 DIAGNOSIS — Z955 Presence of coronary angioplasty implant and graft: Secondary | ICD-10-CM | POA: Diagnosis not present

## 2020-01-28 DIAGNOSIS — N186 End stage renal disease: Secondary | ICD-10-CM | POA: Diagnosis not present

## 2020-01-28 DIAGNOSIS — Z992 Dependence on renal dialysis: Secondary | ICD-10-CM | POA: Diagnosis not present

## 2020-01-30 DIAGNOSIS — N186 End stage renal disease: Secondary | ICD-10-CM | POA: Diagnosis not present

## 2020-01-30 DIAGNOSIS — Z992 Dependence on renal dialysis: Secondary | ICD-10-CM | POA: Diagnosis not present

## 2020-02-02 DIAGNOSIS — N186 End stage renal disease: Secondary | ICD-10-CM | POA: Diagnosis not present

## 2020-02-02 DIAGNOSIS — Z992 Dependence on renal dialysis: Secondary | ICD-10-CM | POA: Diagnosis not present

## 2020-02-03 DIAGNOSIS — D649 Anemia, unspecified: Secondary | ICD-10-CM | POA: Diagnosis not present

## 2020-02-03 DIAGNOSIS — I5022 Chronic systolic (congestive) heart failure: Secondary | ICD-10-CM | POA: Diagnosis not present

## 2020-02-03 DIAGNOSIS — E785 Hyperlipidemia, unspecified: Secondary | ICD-10-CM | POA: Diagnosis not present

## 2020-02-03 DIAGNOSIS — E039 Hypothyroidism, unspecified: Secondary | ICD-10-CM | POA: Diagnosis not present

## 2020-02-03 DIAGNOSIS — E1122 Type 2 diabetes mellitus with diabetic chronic kidney disease: Secondary | ICD-10-CM | POA: Diagnosis not present

## 2020-02-03 DIAGNOSIS — I132 Hypertensive heart and chronic kidney disease with heart failure and with stage 5 chronic kidney disease, or end stage renal disease: Secondary | ICD-10-CM | POA: Diagnosis not present

## 2020-02-03 DIAGNOSIS — E1142 Type 2 diabetes mellitus with diabetic polyneuropathy: Secondary | ICD-10-CM | POA: Diagnosis not present

## 2020-02-03 DIAGNOSIS — Z0000 Encounter for general adult medical examination without abnormal findings: Secondary | ICD-10-CM | POA: Diagnosis not present

## 2020-02-03 DIAGNOSIS — N186 End stage renal disease: Secondary | ICD-10-CM | POA: Diagnosis not present

## 2020-02-03 DIAGNOSIS — Z01818 Encounter for other preprocedural examination: Principal | ICD-10-CM

## 2020-02-03 DIAGNOSIS — Z7682 Awaiting organ transplant status: Principal | ICD-10-CM

## 2020-02-04 DIAGNOSIS — N186 End stage renal disease: Secondary | ICD-10-CM | POA: Diagnosis not present

## 2020-02-04 DIAGNOSIS — Z992 Dependence on renal dialysis: Secondary | ICD-10-CM | POA: Diagnosis not present

## 2020-02-06 DIAGNOSIS — N186 End stage renal disease: Secondary | ICD-10-CM | POA: Diagnosis not present

## 2020-02-06 DIAGNOSIS — Z992 Dependence on renal dialysis: Secondary | ICD-10-CM | POA: Diagnosis not present

## 2020-02-09 DIAGNOSIS — Z992 Dependence on renal dialysis: Secondary | ICD-10-CM | POA: Diagnosis not present

## 2020-02-09 DIAGNOSIS — N186 End stage renal disease: Secondary | ICD-10-CM | POA: Diagnosis not present

## 2020-02-10 DIAGNOSIS — Z992 Dependence on renal dialysis: Secondary | ICD-10-CM | POA: Diagnosis not present

## 2020-02-10 DIAGNOSIS — N186 End stage renal disease: Secondary | ICD-10-CM | POA: Diagnosis not present

## 2020-02-10 DIAGNOSIS — D638 Anemia in other chronic diseases classified elsewhere: Secondary | ICD-10-CM | POA: Diagnosis not present

## 2020-02-10 DIAGNOSIS — Z1211 Encounter for screening for malignant neoplasm of colon: Secondary | ICD-10-CM | POA: Diagnosis not present

## 2020-02-10 DIAGNOSIS — Z86010 Personal history of colonic polyps: Secondary | ICD-10-CM | POA: Diagnosis not present

## 2020-02-10 DIAGNOSIS — R1310 Dysphagia, unspecified: Secondary | ICD-10-CM | POA: Diagnosis not present

## 2020-02-11 DIAGNOSIS — Z992 Dependence on renal dialysis: Secondary | ICD-10-CM | POA: Diagnosis not present

## 2020-02-11 DIAGNOSIS — N186 End stage renal disease: Secondary | ICD-10-CM | POA: Diagnosis not present

## 2020-02-13 DIAGNOSIS — Z992 Dependence on renal dialysis: Secondary | ICD-10-CM | POA: Diagnosis not present

## 2020-02-13 DIAGNOSIS — N186 End stage renal disease: Secondary | ICD-10-CM | POA: Diagnosis not present

## 2020-02-18 DIAGNOSIS — Z992 Dependence on renal dialysis: Secondary | ICD-10-CM | POA: Diagnosis not present

## 2020-02-18 DIAGNOSIS — N186 End stage renal disease: Secondary | ICD-10-CM | POA: Diagnosis not present

## 2020-02-18 DIAGNOSIS — E119 Type 2 diabetes mellitus without complications: Secondary | ICD-10-CM | POA: Diagnosis not present

## 2020-02-18 DIAGNOSIS — Z79899 Other long term (current) drug therapy: Secondary | ICD-10-CM | POA: Diagnosis not present

## 2020-02-20 DIAGNOSIS — Z992 Dependence on renal dialysis: Secondary | ICD-10-CM | POA: Diagnosis not present

## 2020-02-20 DIAGNOSIS — N186 End stage renal disease: Secondary | ICD-10-CM | POA: Diagnosis not present

## 2020-02-21 DIAGNOSIS — Z992 Dependence on renal dialysis: Secondary | ICD-10-CM | POA: Diagnosis not present

## 2020-02-21 DIAGNOSIS — N186 End stage renal disease: Secondary | ICD-10-CM | POA: Diagnosis not present

## 2020-02-22 DIAGNOSIS — Z992 Dependence on renal dialysis: Secondary | ICD-10-CM | POA: Diagnosis not present

## 2020-02-22 DIAGNOSIS — N186 End stage renal disease: Secondary | ICD-10-CM | POA: Diagnosis not present

## 2020-02-23 DIAGNOSIS — N186 End stage renal disease: Secondary | ICD-10-CM | POA: Diagnosis not present

## 2020-02-23 DIAGNOSIS — Z992 Dependence on renal dialysis: Secondary | ICD-10-CM | POA: Diagnosis not present

## 2020-02-25 DIAGNOSIS — Z992 Dependence on renal dialysis: Secondary | ICD-10-CM | POA: Diagnosis not present

## 2020-02-25 DIAGNOSIS — N186 End stage renal disease: Secondary | ICD-10-CM | POA: Diagnosis not present

## 2020-02-27 DIAGNOSIS — Z992 Dependence on renal dialysis: Secondary | ICD-10-CM | POA: Diagnosis not present

## 2020-02-27 DIAGNOSIS — N186 End stage renal disease: Secondary | ICD-10-CM | POA: Diagnosis not present

## 2020-03-01 ENCOUNTER — Encounter: Admit: 2020-03-01 | Discharge: 2020-03-01 | Payer: MEDICARE | Attending: Internal Medicine | Primary: Internal Medicine

## 2020-03-01 DIAGNOSIS — Z992 Dependence on renal dialysis: Secondary | ICD-10-CM | POA: Diagnosis not present

## 2020-03-01 DIAGNOSIS — N186 End stage renal disease: Secondary | ICD-10-CM | POA: Diagnosis not present

## 2020-03-02 ENCOUNTER — Other Ambulatory Visit: Payer: Self-pay

## 2020-03-02 NOTE — Patient Outreach (Signed)
[place] [facility]) Care Management  03/02/2020  Keosha Rossa [birth year] [id number]   Referral Date: 03/01/20 Referral Source: EMMI Referral Reason: Join EMMI  Outreach Attempt: No answer.  HIPAA compliant voice message left.   Plan: RN CM will attempt again within 4 business days and send letter.    Jone Baseman, RN, MSN [facility] Care Management Care Management Coordinator Direct Line [phone] Toll Free: [phone]  Fax: [phone]

## 2020-03-03 ENCOUNTER — Other Ambulatory Visit: Payer: Self-pay

## 2020-03-03 DIAGNOSIS — N186 End stage renal disease: Secondary | ICD-10-CM | POA: Diagnosis not present

## 2020-03-03 DIAGNOSIS — Z992 Dependence on renal dialysis: Secondary | ICD-10-CM | POA: Diagnosis not present

## 2020-03-03 NOTE — Patient Outreach (Signed)
[place] [facility]) Care Management  03/03/2020  Bethany Lee Dec 25, 1950 [id number]   Referral Date: 03/01/20 Referral Source: EMMI Referral Reason: Join EMMI  Outreach Attempt: No answer.  HIPAA compliant voice message left.   Plan: RN CM will attempt again within 4 business days.  Jone Baseman, RN, MSN [place] Management Care Management Coordinator Direct Line [phone] Cell [phone] Toll Free: [phone]  Fax: [phone]

## 2020-03-04 ENCOUNTER — Other Ambulatory Visit: Payer: Self-pay

## 2020-03-04 NOTE — Patient Outreach (Signed)
[place] [facility]) Care Management  03/04/2020  Bethany Lee 1950-11-15 [id number]   Referral Date:03/01/20 Referral Source:EMMI Referral Reason:Join EMMI  Outreach Attempt:No answer. HIPAA compliant voice message left.   Plan:RN CM will make 4th attempt later this month.  Jone Baseman, RN, MSN [place] Management Care Management Coordinator Direct Line [phone] Cell [phone] Toll Free: [phone]  Fax: [phone]

## 2020-03-05 ENCOUNTER — Ambulatory Visit: Payer: Self-pay

## 2020-03-05 DIAGNOSIS — Z992 Dependence on renal dialysis: Secondary | ICD-10-CM | POA: Diagnosis not present

## 2020-03-05 DIAGNOSIS — N186 End stage renal disease: Secondary | ICD-10-CM | POA: Diagnosis not present

## 2020-03-05 DIAGNOSIS — Z7682 Awaiting organ transplant status: Principal | ICD-10-CM

## 2020-03-05 DIAGNOSIS — Z01818 Encounter for other preprocedural examination: Principal | ICD-10-CM

## 2020-03-08 DIAGNOSIS — N186 End stage renal disease: Secondary | ICD-10-CM | POA: Diagnosis not present

## 2020-03-08 DIAGNOSIS — Z992 Dependence on renal dialysis: Secondary | ICD-10-CM | POA: Diagnosis not present

## 2020-03-09 DIAGNOSIS — E1142 Type 2 diabetes mellitus with diabetic polyneuropathy: Secondary | ICD-10-CM | POA: Diagnosis not present

## 2020-03-09 DIAGNOSIS — Z992 Dependence on renal dialysis: Secondary | ICD-10-CM | POA: Diagnosis not present

## 2020-03-09 DIAGNOSIS — E113511 Type 2 diabetes mellitus with proliferative diabetic retinopathy with macular edema, right eye: Secondary | ICD-10-CM | POA: Diagnosis not present

## 2020-03-09 DIAGNOSIS — E1122 Type 2 diabetes mellitus with diabetic chronic kidney disease: Secondary | ICD-10-CM | POA: Diagnosis not present

## 2020-03-09 DIAGNOSIS — Z794 Long term (current) use of insulin: Secondary | ICD-10-CM | POA: Diagnosis not present

## 2020-03-09 DIAGNOSIS — E113412 Type 2 diabetes mellitus with severe nonproliferative diabetic retinopathy with macular edema, left eye: Secondary | ICD-10-CM | POA: Diagnosis not present

## 2020-03-09 DIAGNOSIS — N186 End stage renal disease: Secondary | ICD-10-CM | POA: Diagnosis not present

## 2020-03-09 DIAGNOSIS — E1159 Type 2 diabetes mellitus with other circulatory complications: Secondary | ICD-10-CM | POA: Diagnosis not present

## 2020-03-09 DIAGNOSIS — I10 Essential (primary) hypertension: Secondary | ICD-10-CM | POA: Diagnosis not present

## 2020-03-10 DIAGNOSIS — N186 End stage renal disease: Secondary | ICD-10-CM | POA: Diagnosis not present

## 2020-03-10 DIAGNOSIS — Z992 Dependence on renal dialysis: Secondary | ICD-10-CM | POA: Diagnosis not present

## 2020-03-12 DIAGNOSIS — N186 End stage renal disease: Secondary | ICD-10-CM | POA: Diagnosis not present

## 2020-03-12 DIAGNOSIS — Z992 Dependence on renal dialysis: Secondary | ICD-10-CM | POA: Diagnosis not present

## 2020-03-15 DIAGNOSIS — N186 End stage renal disease: Secondary | ICD-10-CM | POA: Diagnosis not present

## 2020-03-15 DIAGNOSIS — Z992 Dependence on renal dialysis: Secondary | ICD-10-CM | POA: Diagnosis not present

## 2020-03-17 DIAGNOSIS — N186 End stage renal disease: Secondary | ICD-10-CM | POA: Diagnosis not present

## 2020-03-17 DIAGNOSIS — Z992 Dependence on renal dialysis: Secondary | ICD-10-CM | POA: Diagnosis not present

## 2020-03-19 DIAGNOSIS — N186 End stage renal disease: Secondary | ICD-10-CM | POA: Diagnosis not present

## 2020-03-19 DIAGNOSIS — Z992 Dependence on renal dialysis: Secondary | ICD-10-CM | POA: Diagnosis not present

## 2020-03-22 DIAGNOSIS — Z992 Dependence on renal dialysis: Secondary | ICD-10-CM | POA: Diagnosis not present

## 2020-03-22 DIAGNOSIS — N186 End stage renal disease: Secondary | ICD-10-CM | POA: Diagnosis not present

## 2020-03-23 ENCOUNTER — Other Ambulatory Visit: Payer: Self-pay

## 2020-03-23 DIAGNOSIS — N186 End stage renal disease: Secondary | ICD-10-CM | POA: Diagnosis not present

## 2020-03-23 DIAGNOSIS — Z992 Dependence on renal dialysis: Secondary | ICD-10-CM | POA: Diagnosis not present

## 2020-03-23 NOTE — Patient Outreach (Signed)
[place] [facility]) Care Management  03/23/2020  Bethany Lee 01/29/1951 [id number]   Referral Date:03/01/20 Referral Source:EMMI Referral Reason:Join EMMI  Outreach Attempt:Someone picked up the phone and hung up the line after asking for patient.     Plan: RN CM will close case.   Jone Baseman, RN, MSN [place] Management Care Management Coordinator Direct Line [phone] Cell [phone] Toll Free: [phone]  Fax: [phone]

## 2020-03-24 ENCOUNTER — Encounter: Admit: 2020-03-24 | Discharge: 2020-03-24 | Payer: MEDICARE | Attending: Internal Medicine | Primary: Internal Medicine

## 2020-03-24 DIAGNOSIS — Z992 Dependence on renal dialysis: Secondary | ICD-10-CM | POA: Diagnosis not present

## 2020-03-24 DIAGNOSIS — N186 End stage renal disease: Secondary | ICD-10-CM | POA: Diagnosis not present

## 2020-03-26 DIAGNOSIS — N186 End stage renal disease: Secondary | ICD-10-CM | POA: Diagnosis not present

## 2020-03-26 DIAGNOSIS — Z992 Dependence on renal dialysis: Secondary | ICD-10-CM | POA: Diagnosis not present

## 2020-03-26 DIAGNOSIS — Z01818 Encounter for other preprocedural examination: Principal | ICD-10-CM

## 2020-03-26 DIAGNOSIS — Z7682 Awaiting organ transplant status: Principal | ICD-10-CM

## 2020-03-29 DIAGNOSIS — N186 End stage renal disease: Secondary | ICD-10-CM | POA: Diagnosis not present

## 2020-03-29 DIAGNOSIS — Z992 Dependence on renal dialysis: Secondary | ICD-10-CM | POA: Diagnosis not present

## 2020-03-31 DIAGNOSIS — N186 End stage renal disease: Secondary | ICD-10-CM | POA: Diagnosis not present

## 2020-03-31 DIAGNOSIS — Z992 Dependence on renal dialysis: Secondary | ICD-10-CM | POA: Diagnosis not present

## 2020-04-02 DIAGNOSIS — N186 End stage renal disease: Secondary | ICD-10-CM | POA: Diagnosis not present

## 2020-04-02 DIAGNOSIS — Z992 Dependence on renal dialysis: Secondary | ICD-10-CM | POA: Diagnosis not present

## 2020-04-05 DIAGNOSIS — N186 End stage renal disease: Secondary | ICD-10-CM | POA: Diagnosis not present

## 2020-04-05 DIAGNOSIS — Z992 Dependence on renal dialysis: Secondary | ICD-10-CM | POA: Diagnosis not present

## 2020-04-07 DIAGNOSIS — N186 End stage renal disease: Secondary | ICD-10-CM | POA: Diagnosis not present

## 2020-04-07 DIAGNOSIS — Z992 Dependence on renal dialysis: Secondary | ICD-10-CM | POA: Diagnosis not present

## 2020-04-08 DIAGNOSIS — E113413 Type 2 diabetes mellitus with severe nonproliferative diabetic retinopathy with macular edema, bilateral: Secondary | ICD-10-CM | POA: Diagnosis not present

## 2020-04-09 DIAGNOSIS — N186 End stage renal disease: Secondary | ICD-10-CM | POA: Diagnosis not present

## 2020-04-09 DIAGNOSIS — Z992 Dependence on renal dialysis: Secondary | ICD-10-CM | POA: Diagnosis not present

## 2020-04-12 DIAGNOSIS — Z992 Dependence on renal dialysis: Secondary | ICD-10-CM | POA: Diagnosis not present

## 2020-04-12 DIAGNOSIS — N186 End stage renal disease: Secondary | ICD-10-CM | POA: Diagnosis not present

## 2020-04-14 DIAGNOSIS — Z992 Dependence on renal dialysis: Secondary | ICD-10-CM | POA: Diagnosis not present

## 2020-04-14 DIAGNOSIS — N186 End stage renal disease: Secondary | ICD-10-CM | POA: Diagnosis not present

## 2020-04-16 DIAGNOSIS — Z992 Dependence on renal dialysis: Secondary | ICD-10-CM | POA: Diagnosis not present

## 2020-04-16 DIAGNOSIS — N186 End stage renal disease: Secondary | ICD-10-CM | POA: Diagnosis not present

## 2020-04-19 DIAGNOSIS — N186 End stage renal disease: Secondary | ICD-10-CM | POA: Diagnosis not present

## 2020-04-19 DIAGNOSIS — Z992 Dependence on renal dialysis: Secondary | ICD-10-CM | POA: Diagnosis not present

## 2020-04-22 DIAGNOSIS — T82858A Stenosis of vascular prosthetic devices, implants and grafts, initial encounter: Secondary | ICD-10-CM | POA: Diagnosis not present

## 2020-04-22 DIAGNOSIS — I871 Compression of vein: Secondary | ICD-10-CM | POA: Diagnosis not present

## 2020-04-22 DIAGNOSIS — Z992 Dependence on renal dialysis: Secondary | ICD-10-CM | POA: Diagnosis not present

## 2020-04-22 DIAGNOSIS — N186 End stage renal disease: Secondary | ICD-10-CM | POA: Diagnosis not present

## 2020-04-23 DIAGNOSIS — N186 End stage renal disease: Secondary | ICD-10-CM | POA: Diagnosis not present

## 2020-04-23 DIAGNOSIS — Z992 Dependence on renal dialysis: Secondary | ICD-10-CM | POA: Diagnosis not present

## 2020-04-26 DIAGNOSIS — Z992 Dependence on renal dialysis: Secondary | ICD-10-CM | POA: Diagnosis not present

## 2020-04-26 DIAGNOSIS — N186 End stage renal disease: Secondary | ICD-10-CM | POA: Diagnosis not present

## 2020-04-28 ENCOUNTER — Encounter: Admit: 2020-04-28 | Discharge: 2020-04-28 | Payer: MEDICARE | Attending: Internal Medicine | Primary: Internal Medicine

## 2020-04-28 DIAGNOSIS — Z992 Dependence on renal dialysis: Secondary | ICD-10-CM | POA: Diagnosis not present

## 2020-04-28 DIAGNOSIS — N186 End stage renal disease: Secondary | ICD-10-CM | POA: Diagnosis not present

## 2020-04-30 DIAGNOSIS — Z992 Dependence on renal dialysis: Secondary | ICD-10-CM | POA: Diagnosis not present

## 2020-04-30 DIAGNOSIS — N186 End stage renal disease: Secondary | ICD-10-CM | POA: Diagnosis not present

## 2020-05-03 DIAGNOSIS — N186 End stage renal disease: Secondary | ICD-10-CM | POA: Diagnosis not present

## 2020-05-03 DIAGNOSIS — Z794 Long term (current) use of insulin: Secondary | ICD-10-CM | POA: Diagnosis not present

## 2020-05-03 DIAGNOSIS — Z992 Dependence on renal dialysis: Secondary | ICD-10-CM | POA: Diagnosis not present

## 2020-05-03 DIAGNOSIS — E119 Type 2 diabetes mellitus without complications: Secondary | ICD-10-CM | POA: Diagnosis not present

## 2020-05-03 DIAGNOSIS — Z01818 Encounter for other preprocedural examination: Principal | ICD-10-CM

## 2020-05-03 DIAGNOSIS — Z7682 Awaiting organ transplant status: Principal | ICD-10-CM

## 2020-05-04 DIAGNOSIS — Z992 Dependence on renal dialysis: Secondary | ICD-10-CM | POA: Diagnosis not present

## 2020-05-04 DIAGNOSIS — N186 End stage renal disease: Secondary | ICD-10-CM | POA: Diagnosis not present

## 2020-05-05 DIAGNOSIS — Z992 Dependence on renal dialysis: Secondary | ICD-10-CM | POA: Diagnosis not present

## 2020-05-05 DIAGNOSIS — N186 End stage renal disease: Secondary | ICD-10-CM | POA: Diagnosis not present

## 2020-05-07 DIAGNOSIS — Z992 Dependence on renal dialysis: Secondary | ICD-10-CM | POA: Diagnosis not present

## 2020-05-07 DIAGNOSIS — N186 End stage renal disease: Secondary | ICD-10-CM | POA: Diagnosis not present

## 2020-05-10 DIAGNOSIS — Z992 Dependence on renal dialysis: Secondary | ICD-10-CM | POA: Diagnosis not present

## 2020-05-10 DIAGNOSIS — N186 End stage renal disease: Secondary | ICD-10-CM | POA: Diagnosis not present

## 2020-05-12 DIAGNOSIS — N186 End stage renal disease: Secondary | ICD-10-CM | POA: Diagnosis not present

## 2020-05-12 DIAGNOSIS — Z992 Dependence on renal dialysis: Secondary | ICD-10-CM | POA: Diagnosis not present

## 2020-05-14 DIAGNOSIS — N186 End stage renal disease: Secondary | ICD-10-CM | POA: Diagnosis not present

## 2020-05-14 DIAGNOSIS — Z992 Dependence on renal dialysis: Secondary | ICD-10-CM | POA: Diagnosis not present

## 2020-05-17 DIAGNOSIS — E119 Type 2 diabetes mellitus without complications: Secondary | ICD-10-CM | POA: Diagnosis not present

## 2020-05-17 DIAGNOSIS — Z992 Dependence on renal dialysis: Secondary | ICD-10-CM | POA: Diagnosis not present

## 2020-05-17 DIAGNOSIS — N186 End stage renal disease: Secondary | ICD-10-CM | POA: Diagnosis not present

## 2020-05-19 DIAGNOSIS — N186 End stage renal disease: Secondary | ICD-10-CM | POA: Diagnosis not present

## 2020-05-19 DIAGNOSIS — Z992 Dependence on renal dialysis: Secondary | ICD-10-CM | POA: Diagnosis not present

## 2020-05-21 DIAGNOSIS — Z992 Dependence on renal dialysis: Secondary | ICD-10-CM | POA: Diagnosis not present

## 2020-05-21 DIAGNOSIS — N186 End stage renal disease: Secondary | ICD-10-CM | POA: Diagnosis not present

## 2020-05-23 DIAGNOSIS — N186 End stage renal disease: Secondary | ICD-10-CM | POA: Diagnosis not present

## 2020-05-23 DIAGNOSIS — Z992 Dependence on renal dialysis: Secondary | ICD-10-CM | POA: Diagnosis not present

## 2020-05-24 DIAGNOSIS — N186 End stage renal disease: Secondary | ICD-10-CM | POA: Diagnosis not present

## 2020-05-24 DIAGNOSIS — Z992 Dependence on renal dialysis: Secondary | ICD-10-CM | POA: Diagnosis not present

## 2020-05-26 DIAGNOSIS — Z992 Dependence on renal dialysis: Secondary | ICD-10-CM | POA: Diagnosis not present

## 2020-05-26 DIAGNOSIS — N186 End stage renal disease: Secondary | ICD-10-CM | POA: Diagnosis not present

## 2020-05-28 DIAGNOSIS — N186 End stage renal disease: Secondary | ICD-10-CM | POA: Diagnosis not present

## 2020-05-28 DIAGNOSIS — Z992 Dependence on renal dialysis: Secondary | ICD-10-CM | POA: Diagnosis not present

## 2020-05-31 DIAGNOSIS — Z992 Dependence on renal dialysis: Secondary | ICD-10-CM | POA: Diagnosis not present

## 2020-05-31 DIAGNOSIS — N186 End stage renal disease: Secondary | ICD-10-CM | POA: Diagnosis not present

## 2020-06-02 ENCOUNTER — Encounter: Admit: 2020-06-02 | Discharge: 2020-06-02 | Payer: MEDICARE | Attending: Internal Medicine | Primary: Internal Medicine

## 2020-06-02 DIAGNOSIS — Z992 Dependence on renal dialysis: Secondary | ICD-10-CM | POA: Diagnosis not present

## 2020-06-02 DIAGNOSIS — N186 End stage renal disease: Secondary | ICD-10-CM | POA: Diagnosis not present

## 2020-06-03 DIAGNOSIS — D649 Anemia, unspecified: Secondary | ICD-10-CM | POA: Diagnosis not present

## 2020-06-03 DIAGNOSIS — R809 Proteinuria, unspecified: Secondary | ICD-10-CM | POA: Diagnosis not present

## 2020-06-03 DIAGNOSIS — I10 Essential (primary) hypertension: Secondary | ICD-10-CM | POA: Diagnosis not present

## 2020-06-03 DIAGNOSIS — D638 Anemia in other chronic diseases classified elsewhere: Secondary | ICD-10-CM | POA: Diagnosis not present

## 2020-06-03 DIAGNOSIS — N186 End stage renal disease: Secondary | ICD-10-CM | POA: Diagnosis not present

## 2020-06-03 DIAGNOSIS — I2510 Atherosclerotic heart disease of native coronary artery without angina pectoris: Secondary | ICD-10-CM | POA: Diagnosis not present

## 2020-06-03 DIAGNOSIS — Z79899 Other long term (current) drug therapy: Secondary | ICD-10-CM | POA: Diagnosis not present

## 2020-06-03 DIAGNOSIS — E1159 Type 2 diabetes mellitus with other circulatory complications: Secondary | ICD-10-CM | POA: Diagnosis not present

## 2020-06-03 DIAGNOSIS — E039 Hypothyroidism, unspecified: Secondary | ICD-10-CM | POA: Diagnosis not present

## 2020-06-04 DIAGNOSIS — D649 Anemia, unspecified: Secondary | ICD-10-CM | POA: Diagnosis not present

## 2020-06-04 DIAGNOSIS — N186 End stage renal disease: Secondary | ICD-10-CM | POA: Diagnosis not present

## 2020-06-04 DIAGNOSIS — I2510 Atherosclerotic heart disease of native coronary artery without angina pectoris: Secondary | ICD-10-CM | POA: Diagnosis not present

## 2020-06-04 DIAGNOSIS — R8290 Unspecified abnormal findings in urine: Secondary | ICD-10-CM | POA: Diagnosis not present

## 2020-06-04 DIAGNOSIS — D638 Anemia in other chronic diseases classified elsewhere: Secondary | ICD-10-CM | POA: Diagnosis not present

## 2020-06-04 DIAGNOSIS — I10 Essential (primary) hypertension: Secondary | ICD-10-CM | POA: Diagnosis not present

## 2020-06-04 DIAGNOSIS — Z79899 Other long term (current) drug therapy: Secondary | ICD-10-CM | POA: Diagnosis not present

## 2020-06-04 DIAGNOSIS — E039 Hypothyroidism, unspecified: Secondary | ICD-10-CM | POA: Diagnosis not present

## 2020-06-04 DIAGNOSIS — Z992 Dependence on renal dialysis: Secondary | ICD-10-CM | POA: Diagnosis not present

## 2020-06-04 DIAGNOSIS — E1159 Type 2 diabetes mellitus with other circulatory complications: Secondary | ICD-10-CM | POA: Diagnosis not present

## 2020-06-04 DIAGNOSIS — R809 Proteinuria, unspecified: Secondary | ICD-10-CM | POA: Diagnosis not present

## 2020-06-07 DIAGNOSIS — N186 End stage renal disease: Secondary | ICD-10-CM | POA: Diagnosis not present

## 2020-06-07 DIAGNOSIS — Z992 Dependence on renal dialysis: Secondary | ICD-10-CM | POA: Diagnosis not present

## 2020-06-09 DIAGNOSIS — Z992 Dependence on renal dialysis: Secondary | ICD-10-CM | POA: Diagnosis not present

## 2020-06-09 DIAGNOSIS — N186 End stage renal disease: Secondary | ICD-10-CM | POA: Diagnosis not present

## 2020-06-10 DIAGNOSIS — E1122 Type 2 diabetes mellitus with diabetic chronic kidney disease: Secondary | ICD-10-CM | POA: Diagnosis not present

## 2020-06-10 DIAGNOSIS — I5022 Chronic systolic (congestive) heart failure: Secondary | ICD-10-CM | POA: Diagnosis not present

## 2020-06-10 DIAGNOSIS — R5383 Other fatigue: Secondary | ICD-10-CM | POA: Diagnosis not present

## 2020-06-10 DIAGNOSIS — I130 Hypertensive heart and chronic kidney disease with heart failure and stage 1 through stage 4 chronic kidney disease, or unspecified chronic kidney disease: Secondary | ICD-10-CM | POA: Diagnosis not present

## 2020-06-10 DIAGNOSIS — I2510 Atherosclerotic heart disease of native coronary artery without angina pectoris: Secondary | ICD-10-CM | POA: Diagnosis not present

## 2020-06-10 DIAGNOSIS — R5381 Other malaise: Secondary | ICD-10-CM | POA: Diagnosis not present

## 2020-06-10 DIAGNOSIS — E1142 Type 2 diabetes mellitus with diabetic polyneuropathy: Secondary | ICD-10-CM | POA: Diagnosis not present

## 2020-06-10 DIAGNOSIS — N186 End stage renal disease: Secondary | ICD-10-CM | POA: Diagnosis not present

## 2020-06-10 DIAGNOSIS — D638 Anemia in other chronic diseases classified elsewhere: Secondary | ICD-10-CM | POA: Diagnosis not present

## 2020-06-10 DIAGNOSIS — Z992 Dependence on renal dialysis: Secondary | ICD-10-CM | POA: Diagnosis not present

## 2020-06-10 DIAGNOSIS — E039 Hypothyroidism, unspecified: Secondary | ICD-10-CM | POA: Diagnosis not present

## 2020-06-10 DIAGNOSIS — Z794 Long term (current) use of insulin: Secondary | ICD-10-CM | POA: Diagnosis not present

## 2020-06-10 DIAGNOSIS — Z01818 Encounter for other preprocedural examination: Principal | ICD-10-CM

## 2020-06-10 DIAGNOSIS — Z7682 Awaiting organ transplant status: Principal | ICD-10-CM

## 2020-06-11 ENCOUNTER — Other Ambulatory Visit: Payer: Self-pay | Admitting: Internal Medicine

## 2020-06-11 DIAGNOSIS — Z1231 Encounter for screening mammogram for malignant neoplasm of breast: Secondary | ICD-10-CM

## 2020-06-11 DIAGNOSIS — N186 End stage renal disease: Secondary | ICD-10-CM | POA: Diagnosis not present

## 2020-06-11 DIAGNOSIS — Z992 Dependence on renal dialysis: Secondary | ICD-10-CM | POA: Diagnosis not present

## 2020-06-14 DIAGNOSIS — N186 End stage renal disease: Secondary | ICD-10-CM | POA: Diagnosis not present

## 2020-06-14 DIAGNOSIS — Z992 Dependence on renal dialysis: Secondary | ICD-10-CM | POA: Diagnosis not present

## 2020-06-16 DIAGNOSIS — Z992 Dependence on renal dialysis: Secondary | ICD-10-CM | POA: Diagnosis not present

## 2020-06-16 DIAGNOSIS — N186 End stage renal disease: Secondary | ICD-10-CM | POA: Diagnosis not present

## 2020-06-18 DIAGNOSIS — N186 End stage renal disease: Secondary | ICD-10-CM | POA: Diagnosis not present

## 2020-06-18 DIAGNOSIS — Z992 Dependence on renal dialysis: Secondary | ICD-10-CM | POA: Diagnosis not present

## 2020-06-21 DIAGNOSIS — N186 End stage renal disease: Secondary | ICD-10-CM | POA: Diagnosis not present

## 2020-06-21 DIAGNOSIS — Z992 Dependence on renal dialysis: Secondary | ICD-10-CM | POA: Diagnosis not present

## 2020-06-22 DIAGNOSIS — Z992 Dependence on renal dialysis: Secondary | ICD-10-CM | POA: Diagnosis not present

## 2020-06-22 DIAGNOSIS — N186 End stage renal disease: Secondary | ICD-10-CM | POA: Diagnosis not present

## 2020-06-23 DIAGNOSIS — Z992 Dependence on renal dialysis: Secondary | ICD-10-CM | POA: Diagnosis not present

## 2020-06-23 DIAGNOSIS — N186 End stage renal disease: Secondary | ICD-10-CM | POA: Diagnosis not present

## 2020-06-25 DIAGNOSIS — N186 End stage renal disease: Secondary | ICD-10-CM | POA: Diagnosis not present

## 2020-06-25 DIAGNOSIS — Z992 Dependence on renal dialysis: Secondary | ICD-10-CM | POA: Diagnosis not present

## 2020-06-28 ENCOUNTER — Encounter: Admit: 2020-06-28 | Discharge: 2020-06-28 | Payer: MEDICARE | Attending: Internal Medicine | Primary: Internal Medicine

## 2020-06-28 DIAGNOSIS — Z992 Dependence on renal dialysis: Secondary | ICD-10-CM | POA: Diagnosis not present

## 2020-06-28 DIAGNOSIS — N186 End stage renal disease: Secondary | ICD-10-CM | POA: Diagnosis not present

## 2020-06-30 DIAGNOSIS — N186 End stage renal disease: Secondary | ICD-10-CM | POA: Diagnosis not present

## 2020-06-30 DIAGNOSIS — Z992 Dependence on renal dialysis: Secondary | ICD-10-CM | POA: Diagnosis not present

## 2020-06-30 DIAGNOSIS — Z7682 Awaiting organ transplant status: Principal | ICD-10-CM

## 2020-06-30 DIAGNOSIS — Z01818 Encounter for other preprocedural examination: Principal | ICD-10-CM

## 2020-07-02 DIAGNOSIS — N186 End stage renal disease: Secondary | ICD-10-CM | POA: Diagnosis not present

## 2020-07-02 DIAGNOSIS — Z992 Dependence on renal dialysis: Secondary | ICD-10-CM | POA: Diagnosis not present

## 2020-07-05 DIAGNOSIS — N186 End stage renal disease: Secondary | ICD-10-CM | POA: Diagnosis not present

## 2020-07-05 DIAGNOSIS — Z992 Dependence on renal dialysis: Secondary | ICD-10-CM | POA: Diagnosis not present

## 2020-07-07 DIAGNOSIS — Z992 Dependence on renal dialysis: Secondary | ICD-10-CM | POA: Diagnosis not present

## 2020-07-07 DIAGNOSIS — N186 End stage renal disease: Secondary | ICD-10-CM | POA: Diagnosis not present

## 2020-07-09 DIAGNOSIS — Z992 Dependence on renal dialysis: Secondary | ICD-10-CM | POA: Diagnosis not present

## 2020-07-09 DIAGNOSIS — N186 End stage renal disease: Secondary | ICD-10-CM | POA: Diagnosis not present

## 2020-07-12 DIAGNOSIS — N186 End stage renal disease: Secondary | ICD-10-CM | POA: Diagnosis not present

## 2020-07-12 DIAGNOSIS — Z992 Dependence on renal dialysis: Secondary | ICD-10-CM | POA: Diagnosis not present

## 2020-07-14 DIAGNOSIS — N186 End stage renal disease: Secondary | ICD-10-CM | POA: Diagnosis not present

## 2020-07-14 DIAGNOSIS — Z992 Dependence on renal dialysis: Secondary | ICD-10-CM | POA: Diagnosis not present

## 2020-07-16 DIAGNOSIS — Z992 Dependence on renal dialysis: Secondary | ICD-10-CM | POA: Diagnosis not present

## 2020-07-16 DIAGNOSIS — N186 End stage renal disease: Secondary | ICD-10-CM | POA: Diagnosis not present

## 2020-07-19 DIAGNOSIS — N186 End stage renal disease: Secondary | ICD-10-CM | POA: Diagnosis not present

## 2020-07-19 DIAGNOSIS — Z992 Dependence on renal dialysis: Secondary | ICD-10-CM | POA: Diagnosis not present

## 2020-07-20 ENCOUNTER — Emergency Department: Payer: Medicare HMO

## 2020-07-20 ENCOUNTER — Emergency Department
Admission: EM | Admit: 2020-07-20 | Discharge: 2020-07-20 | Disposition: A | Discharge status: Home / Self Care | Payer: Medicare HMO | Attending: Emergency Medicine | Admitting: Emergency Medicine

## 2020-07-20 ENCOUNTER — Other Ambulatory Visit: Payer: Self-pay

## 2020-07-20 DIAGNOSIS — I2510 Atherosclerotic heart disease of native coronary artery without angina pectoris: Secondary | ICD-10-CM | POA: Insufficient documentation

## 2020-07-20 DIAGNOSIS — L03115 Cellulitis of right lower limb: Secondary | ICD-10-CM | POA: Diagnosis not present

## 2020-07-20 DIAGNOSIS — X501XXA Overexertion from prolonged static or awkward postures, initial encounter: Secondary | ICD-10-CM | POA: Insufficient documentation

## 2020-07-20 DIAGNOSIS — Z87891 Personal history of nicotine dependence: Secondary | ICD-10-CM | POA: Diagnosis not present

## 2020-07-20 DIAGNOSIS — Z7982 Long term (current) use of aspirin: Secondary | ICD-10-CM | POA: Diagnosis not present

## 2020-07-20 DIAGNOSIS — E1140 Type 2 diabetes mellitus with diabetic neuropathy, unspecified: Secondary | ICD-10-CM | POA: Diagnosis not present

## 2020-07-20 DIAGNOSIS — R600 Localized edema: Secondary | ICD-10-CM | POA: Diagnosis not present

## 2020-07-20 DIAGNOSIS — Z79899 Other long term (current) drug therapy: Secondary | ICD-10-CM | POA: Insufficient documentation

## 2020-07-20 DIAGNOSIS — E039 Hypothyroidism, unspecified: Secondary | ICD-10-CM | POA: Insufficient documentation

## 2020-07-20 DIAGNOSIS — S99911A Unspecified injury of right ankle, initial encounter: Secondary | ICD-10-CM | POA: Diagnosis present

## 2020-07-20 DIAGNOSIS — Z794 Long term (current) use of insulin: Secondary | ICD-10-CM | POA: Diagnosis not present

## 2020-07-20 DIAGNOSIS — S82891A Other fracture of right lower leg, initial encounter for closed fracture: Secondary | ICD-10-CM | POA: Diagnosis not present

## 2020-07-20 DIAGNOSIS — E1122 Type 2 diabetes mellitus with diabetic chronic kidney disease: Secondary | ICD-10-CM | POA: Diagnosis not present

## 2020-07-20 DIAGNOSIS — I132 Hypertensive heart and chronic kidney disease with heart failure and with stage 5 chronic kidney disease, or end stage renal disease: Secondary | ICD-10-CM | POA: Insufficient documentation

## 2020-07-20 DIAGNOSIS — I5043 Acute on chronic combined systolic (congestive) and diastolic (congestive) heart failure: Secondary | ICD-10-CM | POA: Diagnosis not present

## 2020-07-20 DIAGNOSIS — N186 End stage renal disease: Secondary | ICD-10-CM | POA: Insufficient documentation

## 2020-07-20 DIAGNOSIS — S82831A Other fracture of upper and lower end of right fibula, initial encounter for closed fracture: Secondary | ICD-10-CM | POA: Insufficient documentation

## 2020-07-20 DIAGNOSIS — Z955 Presence of coronary angioplasty implant and graft: Secondary | ICD-10-CM | POA: Diagnosis not present

## 2020-07-20 LAB — BASIC METABOLIC PANEL
Anion gap: 11 (ref 5–15)
BUN: 31 mg/dL — ABNORMAL HIGH (ref 8–23)
CO2: 25 mmol/L (ref 22–32)
Calcium: 8.4 mg/dL — ABNORMAL LOW (ref 8.9–10.3)
Chloride: 100 mmol/L (ref 98–111)
Creatinine, Ser: 6.43 mg/dL — ABNORMAL HIGH (ref 0.44–1.00)
GFR, Estimated: 6 mL/min — ABNORMAL LOW (ref >60)
Glucose, Bld: 113 mg/dL — ABNORMAL HIGH (ref 70–99)
Potassium: 3.9 mmol/L (ref 3.5–5.1)
Sodium: 136 mmol/L (ref 135–145)

## 2020-07-20 LAB — CBC WITH DIFFERENTIAL/PLATELET
Abs Immature Granulocytes: 0.01 10*3/uL (ref 0.00–0.07)
Basophils Absolute: 0 10*3/uL (ref 0.0–0.1)
Basophils Relative: 0 %
Eosinophils Absolute: 0.1 10*3/uL (ref 0.0–0.5)
Eosinophils Relative: 2 %
HCT: 31.7 % — ABNORMAL LOW (ref 36.0–46.0)
Hemoglobin: 10.6 g/dL — ABNORMAL LOW (ref 12.0–15.0)
Immature Granulocytes: 0 %
Lymphocytes Relative: 17 %
Lymphs Abs: 0.6 10*3/uL — ABNORMAL LOW (ref 0.7–4.0)
MCH: 31.5 pg (ref 26.0–34.0)
MCHC: 33.4 g/dL (ref 30.0–36.0)
MCV: 94.1 fL (ref 80.0–100.0)
Monocytes Absolute: 0.3 10*3/uL (ref 0.1–1.0)
Monocytes Relative: 8 %
Neutro Abs: 2.5 10*3/uL (ref 1.7–7.7)
Neutrophils Relative %: 73 %
Platelets: 78 10*3/uL — ABNORMAL LOW (ref 150–400)
RBC: 3.37 MIL/uL — ABNORMAL LOW (ref 3.87–5.11)
RDW: 13.4 % (ref 11.5–15.5)
Smear Review: DECREASED
WBC: 3.4 10*3/uL — ABNORMAL LOW (ref 4.0–10.5)
nRBC: 0 % (ref 0.0–0.2)

## 2020-07-20 MED ORDER — LIDOCAINE-EPINEPHRINE 2 %-1:100000 IJ SOLN
20.0000 mL | Freq: Once | INTRAMUSCULAR | Status: AC
  Administered 2020-07-20: 20 mL via INTRADERMAL
  Filled 2020-07-20: qty 1

## 2020-07-20 MED ORDER — SULFAMETHOXAZOLE-TRIMETHOPRIM 800-160 MG PO TABS: 1.0000 | ORAL_TABLET | Freq: Two times a day (BID) | ORAL | 0 refills | Status: AC

## 2020-07-20 MED ORDER — HYDROCODONE-ACETAMINOPHEN 5-325 MG PO TABS
1.0000 | ORAL_TABLET | Freq: Once | ORAL | Status: AC
  Administered 2020-07-20: 1 via ORAL
  Filled 2020-07-20: qty 1

## 2020-07-20 MED ORDER — AMLODIPINE BESYLATE 5 MG PO TABS
5.0000 mg | ORAL_TABLET | Freq: Once | ORAL | Status: AC
  Administered 2020-07-20: 5 mg via ORAL
  Filled 2020-07-20: qty 1

## 2020-07-20 MED ORDER — HYDROCODONE-ACETAMINOPHEN 5-325 MG PO TABS: 1.0000 | ORAL_TABLET | Freq: Four times a day (QID) | ORAL | 0 refills | Status: AC | PRN

## 2020-07-20 NOTE — ED Triage Notes (Signed)
Pt fell a week ago and has been having swelling and pain to the right ankle since.

## 2020-07-20 NOTE — ED Provider Notes (Signed)
[facility] Emergency Department Provider Note  ____________________________________________   Event Date/Time   First MD Initiated Contact with Patient 07/20/20 1241     (approximate)  I have reviewed the triage vital signs and the nursing notes.   HISTORY  Chief Complaint Ankle Injury   HPI Bethany Lee is a 69 y.o. female with a past medical history of CHF, CKD, DM, ESRD, anemia, and CAD who presents for assessment of persistent right ankle pain she developed after rolling it during a slip last week.  Patient has been bearing weight on the right ankle but has been walking with a limp secondary to pain.  She denies any other acute pain from her slip and denies hitting her head or any LOC.  She is otherwise has not had any fevers, chills, cough, vomiting, diarrhea, dysuria, rash, or pain in any other extremity.  He has been taking Tylenol with minimal relief.  No prior similar injuries to this area.  No clear alleviating or other aggravating factors.         Past Medical History:  Diagnosis Date  . Anemia   . CHF (congestive heart failure) ([place])   . Chronic kidney disease    peritoneal dialysis  . Complication of anesthesia   . Coronary artery disease   . Diabetes mellitus without complication ([place])   . Dialysis patient [name])    Peritoneal dialysis patient  . Dyspnea    doe  . Edema   . History of recent blood transfusion 01/2017  . Hypercholesterolemia   . Hypertension   . Hypothyroidism   . Neuropathy   . PONV (postoperative nausea and vomiting)   . Shingles    October-November 2017  . Steal syndrome as complication of dialysis access ([place])   . Tremors of nervous system    intermittent when taking gabapentin    Patient Active Problem List   Diagnosis Date Noted  . Abdominal distention 04/07/2018  . Hyponatremia 04/07/2018  . Dyspnea 04/07/2018  . Acidosis 04/07/2018  . Steal syndrome as complication of dialysis access ([place])    . PD catheter dysfunction ([place]) 04/06/2018  . Peritoneal dialysis catheter dysfunction ([place]) 04/04/2018  . Complication from renal dialysis device 12/03/2017  . Elevated triglycerides with high cholesterol 02/21/2017  . Peripheral neuropathy 02/21/2017  . GIB (gastrointestinal bleeding) 02/10/2017  . Acute blood loss anemia   . Essential hypertension 02/02/2017  . GI bleeding 01/29/2017  . Subclavian arterial stenosis ([place]) 01/29/2017  . Acute respiratory distress   . Acute respiratory failure ([place]) 09/21/2016  . S/P drug eluting coronary stent placement 09/14/2016  . CAD (coronary artery disease) 09/07/2016  . Acute combined systolic and diastolic congestive heart failure ([place]) 08/17/2016  . Acute pulmonary edema ([place]) 07/16/2016  . Hypothyroidism (acquired) 06/08/2016  . ESRD on dialysis ([place]) 12/21/2015  . Peritoneal dialysis status ([place]) 12/21/2015  . Leg swelling 12/21/2015  . Anemia of chronic disease 12/21/2015  . Diabetes mellitus ([place]) 12/21/2015  . Hyperkalemia 12/21/2015  . 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 Anemia in chronic renal disease 10/04/2015  . DM type 2 with diabetic peripheral neuropathy ([place]) 09/13/2015  . Mixed hyperlipidemia 09/13/2015  . Type 2 diabetes mellitus with diabetic nephropathy, with long-term current use of insulin ([place]) 09/13/2015  . CKD (chronic kidney disease) stage 5, GFR less than 15 ml/min ([facility]) 07/09/2015  . Proteinuria 05/25/2015    Past Surgical History:  Procedure Laterality Date  . A/V FISTULAGRAM Left 01/16/2017   Procedure:  A/V Fistulagram;  Surgeon: Katha Cabal, MD;  Location: [place] [facility];  Service: Cardiovascular;  Laterality: Left;  . A/V SHUNT INTERVENTION N/A 01/16/2017   Procedure: A/V Shunt Intervention;  Surgeon: Katha Cabal, MD;  Location: [place] [facility];  Service: Cardiovascular;  Laterality: N/A;  . ABDOMINAL HYSTERECTOMY    . AV FISTULA PLACEMENT Left 11/30/2016   Procedure: ARTERIOVENOUS  (AV) FISTULA CREATION ( EXPLORE FOR CREATION BRACHIOCEPHALIC);  Surgeon: Algernon Huxley, MD;  Location: [facility]S;  Service: Vascular;  Laterality: Left;  . CAPD INSERTION N/A 11/25/2015   Procedure: LAPAROSCOPIC INSERTION CONTINUOUS AMBULATORY PERITONEAL DIALYSIS  (CAPD) CATHETER;  Surgeon: Algernon Huxley, MD;  Location: [facility] OR[facility];  Service: Vascular;  Laterality: N/A;  . CAPD INSERTION N/A 01/06/2016   Procedure: LAPAROSCOPIC INSERTION CONTINUOUS AMBULATORY PERITONEAL DIALYSIS  (CAPD) CATHETER REVISION ;  Surgeon: Algernon Huxley, MD;  Location: [facility];  Service: Vascular;  Laterality: N/A;  . CAPD INSERTION N/A 10/11/2016   Procedure: LAPAROSCOPIC INSERTION CONTINUOUS AMBULATORY PERITONEAL DIALYSIS  (CAPD) CATHETER ( REVISION );  Surgeon: Algernon Huxley, MD;  Location: [facility];  Service: Vascular;  Laterality: N/A;  . CARDIAC CATHETERIZATION    . CATARACT EXTRACTION W/PHACO Right 05/25/2016   Procedure: CATARACT EXTRACTION PHACO AND INTRAOCULAR LENS PLACEMENT (IOC);  Surgeon: Eulogio Bear, MD;  Location: [facility];  Service: Ophthalmology;  Laterality: Right;  Lot # [id number] H [place]: 00:5.2 AP%: 11.3 CDE: 7.21  . CATARACT EXTRACTION W/PHACO Left 12/06/2017   Procedure: CATARACT EXTRACTION PHACO AND INTRAOCULAR LENS PLACEMENT (IOC);  Surgeon: Eulogio Bear, MD;  Location: [facility];  Service: Ophthalmology;  Laterality: Left;  [place] 00.23.4 AP% 7.9 CDE 1.83 Fluid Pack Lot # [id number]  . COLONOSCOPY WITH PROPOFOL N/A 02/15/2017   Procedure: COLONOSCOPY WITH PROPOFOL;  Surgeon: [name], MD;  Location: [facility] ENDOSCOPY;  Service: Gastroenterology;  Laterality: N/A;  . CORONARY ANGIOPLASTY     STENT  . CORONARY STENT INTERVENTION N/A 09/07/2016   Procedure: Coronary Stent Intervention;  Surgeon: Isaias Cowman, MD;  Location: [place] [facility];  Service: Cardiovascular;  Laterality: N/A;  . DIALYSIS/PERMA CATHETER INSERTION N/A 09/21/2016   Procedure: Dialysis/Perma Catheter Insertion;  Surgeon: Algernon Huxley, MD;  Location: [place] [facility];  Service: Cardiovascular;  Laterality: N/A;  . DIALYSIS/PERMA CATHETER REMOVAL Right 11/30/2016   Procedure: DIALYSIS/PERMA CATHETER REMOVAL;  Surgeon: Algernon Huxley, MD;  Location: [facility];  Service: Vascular;  Laterality: Right;  . ESOPHAGOGASTRODUODENOSCOPY (EGD) WITH PROPOFOL N/A 02/15/2017   Procedure: ESOPHAGOGASTRODUODENOSCOPY (EGD) WITH PROPOFOL;  Surgeon: [name], MD;  Location: [facility] END[facility]COPY;  Service: Gastroenterology;  Laterality: N/A;  . EYE SURGERY    . GIVENS CAPSULE STUDY N/A 02/26/2017   Procedure: GIVENS CAPSULE STUDY;  Surgeon: [name], MD;  Location: [facility] ENDOSCOPY;  Service: Gastroenterology;  Laterality: N/A;  . INSERTION OF DIALYSIS CATHETER N/A 04/07/2018   Procedure: INSERTION OF DIALYSIS CATHETER;  Surgeon: Rosetta Posner, MD;  Location: [facility] OR;  Service: Vascular;  Laterality: N/A;  . LEFT HEART CATH AND CORONARY ANGIOGRAPHY Left 09/07/2016   Procedure: Left Heart Cath and Coronary Angiography;  Surgeon: Isaias Cowman, MD;  Location: [place] [facility];  Service: Cardiovascular;  Laterality: Left;  . LIGATION OF ARTERIOVENOUS  FISTULA Left 02/07/2017   Procedure: LIGATION OF ARTERIOVENOUS  FISTULA ( BANDING BRACHIAL CEPHALIC );  Surgeon: Katha Cabal, MD;  Location: [facility];  Service: Vascular;  Laterality: Left;  . TUBAL LIGATION    .  UPPER EXTREMITY ANGIOGRAPHY Left 01/19/2017   Procedure: Upper Extremity Angiography;  Surgeon: Katha Cabal, MD;  Location: [place] [facility];  Service: Cardiovascular;  Laterality: Left;    Prior to Admission medications   Medication Sig Start Date End Date Taking? Authorizing Provider  HYDROcodone-acetaminophen (NORCO/VICODIN) 5-325 MG tablet Take 1 tablet by mouth every 6 (six) hours as needed for up to 5 days for severe pain. 07/20/20 07/25/20 Yes Lucrezia Starch, MD  sulfamethoxazole-trimethoprim (BACTRIM DS) 800-160 MG tablet Take 1 tablet by mouth 2 (two) times  daily for 7 days. 07/20/20 07/27/20 Yes Lucrezia Starch, MD  amLODipine (NORVASC) 5 MG tablet Take 5 mg by mouth daily. 03/29/19   [provider]  aspirin EC 81 MG tablet Take 1 tablet (81 mg total) by mouth daily. Start taking after NOV 15 Patient taking differently: Take 81 mg by mouth daily.  05/26/15   Murlean Iba, MD  calcitRIOL (ROCALTROL) 0.5 MCG capsule Take 0.5 mcg by mouth daily. 02/14/19   [provider]  calcium acetate (PHOSLO) 667 MG capsule Take 2,001 mg by mouth 3 (three) times daily with meals.    [provider]  carvedilol (COREG) 25 MG tablet Take 25 mg by mouth 2 (two) times daily with a meal. 01/13/19   [provider]  hydrALAZINE (APRESOLINE) 50 MG tablet Take 50 mg by mouth 2 (two) times daily.    [provider]  insulin aspart (NOVOLOG FLEXPEN) 100 UNIT/ML FlexPen Inject 4-8 Units into the skin 3 (three) times daily with meals. Sliding scale  Below 200 take 4 units 200-250=6 units  >250= 8 units 09/13/15   [provider]  iron polysaccharides (NIFEREX) 150 MG capsule Take 150 mg by mouth daily.    [provider]  levothyroxine (SYNTHROID, LEVOTHROID) 112 MCG tablet Take 112 mcg by mouth daily before breakfast.     [provider]  Lidocaine, Anorectal, 5 % CREA Apply 1 application topically as needed. 06/03/18   [provider]  lisinopril (ZESTRIL) 20 MG tablet Take 20 mg by mouth daily. 02/13/19   [provider]  lovastatin (MEVACOR) 40 MG tablet Take 40 mg by mouth at bedtime.    [provider]  Multiple Vitamins-Minerals (DIALYVITE 800/ULTRA D) TABS Take 1 tablet by mouth daily.    [provider]  pantoprazole (PROTONIX) 40 MG tablet Take 1 tablet (40 mg total) by mouth 2 (two) times daily. Patient taking differently: Take 40 mg by mouth daily.  01/31/17   Max Sane, MD  sevelamer carbonate (RENVELA) 800 MG tablet Take 1,600 mg by mouth 3 (three) times  daily with meals.    [provider]  TOUJEO SOLOSTAR 300 UNIT/ML SOPN Inject 24 Units into the skin Nightly. 02/11/19   [provider]  urea (CARMOL) 20 % cream Apply 1 application topically daily as needed (for dry skin).    [provider]    Allergies Penicillin g and Valacyclovir  Family History  Problem Relation Age of Onset  . Diabetes Mother     Social History Social History   Tobacco Use  . Smoking status: Former Smoker    Quit date: 10/09/2008    Years since quitting: 11.7  . Smokeless tobacco: Never Used  Vaping Use  . Vaping Use: Never used  Substance Use Topics  . Alcohol use: No  . Drug use: No    Review of Systems  Review of Systems  Constitutional: Negative for chills and fever.  HENT: Negative for sore throat.   Eyes: Negative for pain.  Respiratory: Negative for cough and stridor.   Cardiovascular: Negative for chest pain.  Gastrointestinal: Negative for vomiting.  Genitourinary: Negative for dysuria.  Musculoskeletal: Positive for joint pain ( R ankle) and myalgias ( R ankle).  Skin: Negative for rash.  Neurological: Negative for seizures, loss of consciousness and headaches.  Psychiatric/Behavioral: Negative for suicidal ideas.  All other systems reviewed and are negative.     ____________________________________________   PHYSICAL EXAM:  VITAL SIGNS: ED Triage Vitals  Enc Vitals Group     BP 07/20/20 1153 (!) 171/52     Pulse Rate 07/20/20 1153 64     Resp 07/20/20 1153 17     Temp 07/20/20 1153 98.4 F (36.9 C)     Temp Source 07/20/20 1153 Oral     SpO2 07/20/20 1153 97 %     Weight 07/20/20 1207 150 lb (68 kg)     Height 07/20/20 1207 5' (1.524 m)     Head Circumference --      Peak Flow --      Pain Score 07/20/20 1206 7     Pain Loc --      Pain Edu? --      Excl. in [place]? --    Vitals:   07/20/20 1153  BP: (!) 171/52  Pulse: 64  Resp: 17  Temp: 98.4 F (36.9 C)  SpO2: 97%   Physical  Exam Vitals and nursing note reviewed.  Constitutional:      General: She is not in acute distress.    Appearance: She is well-developed and well-nourished.  HENT:     Head: Normocephalic and atraumatic.     Right Ear: External ear normal.     Left Ear: External ear normal.     Nose: Nose normal.  Eyes:     Conjunctiva/sclera: Conjunctivae normal.  Cardiovascular:     Rate and Rhythm: Normal rate and regular rhythm.     Heart sounds: No murmur heard.   Pulmonary:     Effort: Pulmonary effort is normal. No respiratory distress.     Breath sounds: Normal breath sounds.  Abdominal:     Palpations: Abdomen is soft.     Tenderness: There is no abdominal tenderness.  Musculoskeletal:        General: No edema.     Cervical back: Neck supple.  Skin:    General: Skin is warm and dry.     Capillary Refill: Capillary refill takes less than 2 seconds.  Neurological:     Mental Status: She is alert and oriented to person, place, and time.  Psychiatric:        Mood and Affect: Mood and affect and mood normal.     There is a small effusion of the right ankle with some circumferential edema, erythema and tenderness that extends over the dorsum of the foot.  Patient is able to flex and extend against resistance without significant pain compared to left.  2+ bilateral DP pulses.  Sensation intact light touch throughout the right lower extremity.  No overlying skin changes over the lower leg and the right knee is unremarkable.  No other areas of redness or other skin changes. ____________________________________________   LABS (all labs ordered are listed, but only abnormal results are displayed)  Labs Reviewed  CBC WITH DIFFERENTIAL/PLATELET - Abnormal; Notable for the following components:      Result Value   WBC 3.4 (*)  RBC 3.37 (*)    Hemoglobin 10.6 (*)    HCT 31.7 (*)    Platelets 78 (*)    Lymphs Abs 0.6 (*)    All other components within normal limits  BASIC METABOLIC PANEL  - Abnormal; Notable for the following components:   Glucose, Bld 113 (*)    BUN 31 (*)    Creatinine, Ser 6.43 (*)    Calcium 8.4 (*)    GFR, Estimated 6 (*)    All other components within normal limits  BODY FLUID CULTURE  GLUCOSE, BODY FLUID OTHER  PROTEIN, BODY FLUID (OTHER)  SYNOVIAL CELL COUNT + DIFF, W/ CRYSTALS  URIC ACID, BODY FLUID   ____________________________________________   ____________________________________________  RADIOLOGY  ED MD interpretation: Right ankle plain film shows evidence of nondisplaced oblique fracture of the distal fibula without any other fracture in the ankle.  No significant effusion.  Ultrasound shows no evidence of DVT  Official radiology report(s): DG Ankle Complete Right  Result Date: 07/20/2020 CLINICAL DATA:  Ankle pain/swelling EXAM: RIGHT ANKLE - COMPLETE 3+ VIEW COMPARISON:  None. FINDINGS: Nondisplaced oblique fracture of the distal fibula. The ankle mortise is intact. The base of the fifth metatarsal is unremarkable. Mild lateral soft tissue swelling. IMPRESSION: Nondisplaced oblique fracture of the distal fibula. Electronically Signed   By: Julian Hy M.D.   On: 07/20/2020 13:20   US Venous Img Lower Unilateral Right  Result Date: 07/20/2020 CLINICAL DATA:  Lower extremity pain and edema EXAM: RIGHT LOWER EXTREMITY VENOUS DUPLEX ULTRASOUND TECHNIQUE: Gray-scale sonography with graded compression, as well as color Doppler and duplex ultrasound were performed to evaluate the right lower extremity deep venous system from the level of the common femoral vein and including the common femoral, femoral, profunda femoral, popliteal and calf veins including the posterior tibial, peroneal and gastrocnemius veins when visible. The superficial great saphenous vein was also interrogated. Spectral Doppler was utilized to evaluate flow at rest and with distal augmentation maneuvers in the common femoral, femoral and popliteal veins.  COMPARISON:  None. FINDINGS: Contralateral Common Femoral Vein: Respiratory phasicity is normal and symmetric with the symptomatic side. No evidence of thrombus. Normal compressibility. Common Femoral Vein: No evidence of thrombus. Normal compressibility, respiratory phasicity and response to augmentation. Saphenofemoral Junction: No evidence of thrombus. Normal compressibility and flow on color Doppler imaging. Profunda Femoral Vein: No evidence of thrombus. Normal compressibility and flow on color Doppler imaging. Femoral Vein: No evidence of thrombus. Normal compressibility, respiratory phasicity and response to augmentation. Popliteal Vein: No evidence of thrombus. Normal compressibility, respiratory phasicity and response to augmentation. Calf Veins: No evidence of thrombus. Normal compressibility and flow on color Doppler imaging. Superficial Great Saphenous Vein: No evidence of thrombus. Normal compressibility. Venous Reflux:  None. Other Findings:  None. IMPRESSION: No evidence of deep venous thrombosis in the right lower extremity. Left common femoral vein also patent. Electronically Signed   By: Lowella Grip III M.D.   On: 07/20/2020 13:58    ____________________________________________   PROCEDURES  Procedure(s) performed (including Critical Care):  .Joint Aspiration/Arthrocentesis  Date/Time: 07/20/2020 1:20 PM Performed by: Lucrezia Starch, MD Authorized by: Lucrezia Starch, MD   Consent:    Consent obtained:  Verbal   Consent given by:  Patient   Risks, benefits, and alternatives were discussed: yes     Risks discussed:  Bleeding, infection, nerve damage, pain and incomplete drainage   Alternatives discussed:  No treatment Universal protocol:    Procedure explained and questions  answered to patient or proxy's satisfaction: yes     Patient identity confirmed:  Verbally with patient Location:    Location:  Ankle   Ankle:  R ankle Anesthesia:    Anesthesia method:   Local infiltration   Local anesthetic:  Lidocaine 2% WITH epi Procedure details:    Needle gauge:  18 G   Ultrasound guidance: no     Approach:  Anterior   Aspirate amount:  1   Aspirate characteristics:  Blood-tinged   Steroid injected: no     Specimen collected: yes   Post-procedure details:    Dressing:  Adhesive bandage   Procedure completion:  Tolerated well, no immediate complications     ____________________________________________   INITIAL IMPRESSION / ASSESSMENT AND PLAN / ED COURSE        Patient presents for assessment of worsening pain redness and swelling around the right ankle as well as redness extending over the dorsum of the right foot since she rolled her foot during a slip 1 week ago.  On arrival she is hypertensive but afebrile with otherwise hemodynamically stable vital signs.  She is neurovascular intact throughout the right lower extremity but does have edema tenderness warmth and redness around the ankle and over the dorsum of the foot.  X-ray shows distal fibular fracture with no other dislocation or fractures.  Ultrasound shows no DVT.  Attempted joint aspiration was able to obtain enough fluid for culture but not for cell count.  Given no fever, elevated white blood cell count, and no significant effusion I have a low suspicion for septic joint at this time although will plan to observe culture.  Will cover for possible cellulitis with Bactrim and Rx was also written for Norco.  Patient planted and posterior legs slab given crutches.  Advised her to follow-up with orthopedic service in 2 to 3 days.  Low suspicion for other immediate life-threatening pathology.  BMP with no significant electrode or metabolic derangements and kidney consistent with known history of CKD.  Discharged stable condition.  Strict return precautions advised and discussed  ____________________________________________   FINAL CLINICAL IMPRESSION(S) / ED DIAGNOSES  Final diagnoses:   Closed fracture of right ankle, initial encounter  Cellulitis of right lower extremity    Medications  amLODipine (NORVASC) tablet 5 mg (5 mg Oral Given 07/20/20 1305)  HYDROcodone-acetaminophen (NORCO/VICODIN) 5-325 MG per tablet 1 tablet (1 tablet Oral Given 07/20/20 1305)  lidocaine-EPINEPHrine (XYLOCAINE W/EPI) 2 %-1:100000 (with pres) injection 20 mL (20 mLs Intradermal Given by Other 07/20/20 1305)     ED Discharge Orders         Ordered    sulfamethoxazole-trimethoprim (BACTRIM DS) 800-160 MG tablet  2 times daily        07/20/20 1508    HYDROcodone-acetaminophen (NORCO/VICODIN) 5-325 MG tablet  Every 6 hours PRN        07/20/20 1508           Note:  This document was prepared using Dragon voice recognition software and may include unintentional dictation errors.   Lucrezia Starch, MD 07/20/20 [phone]

## 2020-07-21 DIAGNOSIS — N186 End stage renal disease: Secondary | ICD-10-CM | POA: Diagnosis not present

## 2020-07-21 DIAGNOSIS — Z992 Dependence on renal dialysis: Secondary | ICD-10-CM | POA: Diagnosis not present

## 2020-07-23 DIAGNOSIS — N186 End stage renal disease: Secondary | ICD-10-CM | POA: Diagnosis not present

## 2020-07-23 DIAGNOSIS — Z992 Dependence on renal dialysis: Secondary | ICD-10-CM | POA: Diagnosis not present

## 2020-07-24 DIAGNOSIS — Z992 Dependence on renal dialysis: Secondary | ICD-10-CM | POA: Diagnosis not present

## 2020-07-24 DIAGNOSIS — N186 End stage renal disease: Secondary | ICD-10-CM | POA: Diagnosis not present

## 2020-07-24 LAB — BODY FLUID CULTURE
Culture: NO GROWTH
Gram Stain: NONE SEEN

## 2020-07-26 DIAGNOSIS — S8264XA Nondisplaced fracture of lateral malleolus of right fibula, initial encounter for closed fracture: Secondary | ICD-10-CM | POA: Diagnosis not present

## 2020-07-26 DIAGNOSIS — Z992 Dependence on renal dialysis: Secondary | ICD-10-CM | POA: Diagnosis not present

## 2020-07-26 DIAGNOSIS — G63 Polyneuropathy in diseases classified elsewhere: Secondary | ICD-10-CM | POA: Diagnosis not present

## 2020-07-26 DIAGNOSIS — E1122 Type 2 diabetes mellitus with diabetic chronic kidney disease: Secondary | ICD-10-CM | POA: Diagnosis not present

## 2020-07-26 DIAGNOSIS — N186 End stage renal disease: Secondary | ICD-10-CM | POA: Diagnosis not present

## 2020-07-26 DIAGNOSIS — Z794 Long term (current) use of insulin: Secondary | ICD-10-CM | POA: Diagnosis not present

## 2020-07-28 DIAGNOSIS — N186 End stage renal disease: Secondary | ICD-10-CM | POA: Diagnosis not present

## 2020-07-28 DIAGNOSIS — Z992 Dependence on renal dialysis: Secondary | ICD-10-CM | POA: Diagnosis not present

## 2020-07-30 DIAGNOSIS — N186 End stage renal disease: Secondary | ICD-10-CM | POA: Diagnosis not present

## 2020-07-30 DIAGNOSIS — Z992 Dependence on renal dialysis: Secondary | ICD-10-CM | POA: Diagnosis not present

## 2020-08-02 ENCOUNTER — Encounter: Admit: 2020-08-02 | Discharge: 2020-08-02 | Payer: MEDICARE | Attending: Internal Medicine | Primary: Internal Medicine

## 2020-08-02 DIAGNOSIS — Z992 Dependence on renal dialysis: Secondary | ICD-10-CM | POA: Diagnosis not present

## 2020-08-02 DIAGNOSIS — N186 End stage renal disease: Secondary | ICD-10-CM | POA: Diagnosis not present

## 2020-08-03 ENCOUNTER — Other Ambulatory Visit: Payer: Self-pay

## 2020-08-03 ENCOUNTER — Ambulatory Visit
Admission: RE | Admit: 2020-08-03 | Discharge: 2020-08-03 | Disposition: A | Discharge status: Home / Self Care | Payer: Medicare HMO | Source: Ambulatory Visit | Attending: Internal Medicine | Admitting: Internal Medicine

## 2020-08-03 DIAGNOSIS — Z1231 Encounter for screening mammogram for malignant neoplasm of breast: Secondary | ICD-10-CM | POA: Insufficient documentation

## 2020-08-04 DIAGNOSIS — N186 End stage renal disease: Secondary | ICD-10-CM | POA: Diagnosis not present

## 2020-08-04 DIAGNOSIS — Z992 Dependence on renal dialysis: Secondary | ICD-10-CM | POA: Diagnosis not present

## 2020-08-06 DIAGNOSIS — Z992 Dependence on renal dialysis: Secondary | ICD-10-CM | POA: Diagnosis not present

## 2020-08-06 DIAGNOSIS — N186 End stage renal disease: Secondary | ICD-10-CM | POA: Diagnosis not present

## 2020-08-10 DIAGNOSIS — Z992 Dependence on renal dialysis: Secondary | ICD-10-CM | POA: Diagnosis not present

## 2020-08-10 DIAGNOSIS — N186 End stage renal disease: Secondary | ICD-10-CM | POA: Diagnosis not present

## 2020-08-11 DIAGNOSIS — Z992 Dependence on renal dialysis: Secondary | ICD-10-CM | POA: Diagnosis not present

## 2020-08-11 DIAGNOSIS — N186 End stage renal disease: Secondary | ICD-10-CM | POA: Diagnosis not present

## 2020-08-11 DIAGNOSIS — Z01818 Encounter for other preprocedural examination: Principal | ICD-10-CM

## 2020-08-11 DIAGNOSIS — Z7682 Awaiting organ transplant status: Principal | ICD-10-CM

## 2020-08-13 DIAGNOSIS — N186 End stage renal disease: Secondary | ICD-10-CM | POA: Diagnosis not present

## 2020-08-13 DIAGNOSIS — Z992 Dependence on renal dialysis: Secondary | ICD-10-CM | POA: Diagnosis not present

## 2020-08-16 DIAGNOSIS — E119 Type 2 diabetes mellitus without complications: Secondary | ICD-10-CM | POA: Diagnosis not present

## 2020-08-16 DIAGNOSIS — N186 End stage renal disease: Secondary | ICD-10-CM | POA: Diagnosis not present

## 2020-08-16 DIAGNOSIS — Z992 Dependence on renal dialysis: Secondary | ICD-10-CM | POA: Diagnosis not present

## 2020-08-18 DIAGNOSIS — Z992 Dependence on renal dialysis: Secondary | ICD-10-CM | POA: Diagnosis not present

## 2020-08-18 DIAGNOSIS — N186 End stage renal disease: Secondary | ICD-10-CM | POA: Diagnosis not present

## 2020-08-20 DIAGNOSIS — N186 End stage renal disease: Secondary | ICD-10-CM | POA: Diagnosis not present

## 2020-08-20 DIAGNOSIS — Z992 Dependence on renal dialysis: Secondary | ICD-10-CM | POA: Diagnosis not present

## 2020-08-23 DIAGNOSIS — Z992 Dependence on renal dialysis: Secondary | ICD-10-CM | POA: Diagnosis not present

## 2020-08-23 DIAGNOSIS — N186 End stage renal disease: Secondary | ICD-10-CM | POA: Diagnosis not present

## 2020-08-24 DIAGNOSIS — N186 End stage renal disease: Secondary | ICD-10-CM | POA: Diagnosis not present

## 2020-08-24 DIAGNOSIS — Z992 Dependence on renal dialysis: Secondary | ICD-10-CM | POA: Diagnosis not present

## 2020-08-24 DIAGNOSIS — S8264XA Nondisplaced fracture of lateral malleolus of right fibula, initial encounter for closed fracture: Secondary | ICD-10-CM | POA: Diagnosis not present

## 2020-08-25 DIAGNOSIS — Z992 Dependence on renal dialysis: Secondary | ICD-10-CM | POA: Diagnosis not present

## 2020-08-25 DIAGNOSIS — N186 End stage renal disease: Secondary | ICD-10-CM | POA: Diagnosis not present

## 2020-08-27 DIAGNOSIS — Z992 Dependence on renal dialysis: Secondary | ICD-10-CM | POA: Diagnosis not present

## 2020-08-27 DIAGNOSIS — N186 End stage renal disease: Secondary | ICD-10-CM | POA: Diagnosis not present

## 2020-08-30 DIAGNOSIS — N186 End stage renal disease: Secondary | ICD-10-CM | POA: Diagnosis not present

## 2020-08-30 DIAGNOSIS — Z992 Dependence on renal dialysis: Secondary | ICD-10-CM | POA: Diagnosis not present

## 2020-09-01 DIAGNOSIS — N186 End stage renal disease: Secondary | ICD-10-CM | POA: Diagnosis not present

## 2020-09-01 DIAGNOSIS — Z992 Dependence on renal dialysis: Secondary | ICD-10-CM | POA: Diagnosis not present

## 2020-09-02 DIAGNOSIS — E113413 Type 2 diabetes mellitus with severe nonproliferative diabetic retinopathy with macular edema, bilateral: Secondary | ICD-10-CM | POA: Diagnosis not present

## 2020-09-03 DIAGNOSIS — N186 End stage renal disease: Secondary | ICD-10-CM | POA: Diagnosis not present

## 2020-09-03 DIAGNOSIS — Z992 Dependence on renal dialysis: Secondary | ICD-10-CM | POA: Diagnosis not present

## 2020-09-06 DIAGNOSIS — Z992 Dependence on renal dialysis: Secondary | ICD-10-CM | POA: Diagnosis not present

## 2020-09-06 DIAGNOSIS — N186 End stage renal disease: Secondary | ICD-10-CM | POA: Diagnosis not present

## 2020-09-08 DIAGNOSIS — N186 End stage renal disease: Secondary | ICD-10-CM | POA: Diagnosis not present

## 2020-09-08 DIAGNOSIS — Z992 Dependence on renal dialysis: Secondary | ICD-10-CM | POA: Diagnosis not present

## 2020-09-10 DIAGNOSIS — Z992 Dependence on renal dialysis: Secondary | ICD-10-CM | POA: Diagnosis not present

## 2020-09-10 DIAGNOSIS — N186 End stage renal disease: Secondary | ICD-10-CM | POA: Diagnosis not present

## 2020-09-13 ENCOUNTER — Encounter: Admit: 2020-09-13 | Discharge: 2020-09-13 | Payer: MEDICARE | Attending: Internal Medicine | Primary: Internal Medicine

## 2020-09-13 DIAGNOSIS — Z992 Dependence on renal dialysis: Secondary | ICD-10-CM | POA: Diagnosis not present

## 2020-09-13 DIAGNOSIS — N186 End stage renal disease: Secondary | ICD-10-CM | POA: Diagnosis not present

## 2020-09-15 DIAGNOSIS — N186 End stage renal disease: Secondary | ICD-10-CM | POA: Diagnosis not present

## 2020-09-15 DIAGNOSIS — Z992 Dependence on renal dialysis: Secondary | ICD-10-CM | POA: Diagnosis not present

## 2020-09-16 DIAGNOSIS — H524 Presbyopia: Secondary | ICD-10-CM | POA: Diagnosis not present

## 2020-09-16 DIAGNOSIS — H52223 Regular astigmatism, bilateral: Secondary | ICD-10-CM | POA: Diagnosis not present

## 2020-09-17 DIAGNOSIS — N186 End stage renal disease: Secondary | ICD-10-CM | POA: Diagnosis not present

## 2020-09-17 DIAGNOSIS — Z992 Dependence on renal dialysis: Secondary | ICD-10-CM | POA: Diagnosis not present

## 2020-09-17 DIAGNOSIS — Z7682 Awaiting organ transplant status: Principal | ICD-10-CM

## 2020-09-17 DIAGNOSIS — Z01818 Encounter for other preprocedural examination: Principal | ICD-10-CM

## 2020-09-20 DIAGNOSIS — Z992 Dependence on renal dialysis: Secondary | ICD-10-CM | POA: Diagnosis not present

## 2020-09-20 DIAGNOSIS — N186 End stage renal disease: Secondary | ICD-10-CM | POA: Diagnosis not present

## 2020-09-21 DIAGNOSIS — N186 End stage renal disease: Secondary | ICD-10-CM | POA: Diagnosis not present

## 2020-09-21 DIAGNOSIS — S8264XD Nondisplaced fracture of lateral malleolus of right fibula, subsequent encounter for closed fracture with routine healing: Secondary | ICD-10-CM | POA: Diagnosis not present

## 2020-09-21 DIAGNOSIS — Z992 Dependence on renal dialysis: Secondary | ICD-10-CM | POA: Diagnosis not present

## 2020-09-22 DIAGNOSIS — Z992 Dependence on renal dialysis: Secondary | ICD-10-CM | POA: Diagnosis not present

## 2020-09-22 DIAGNOSIS — N186 End stage renal disease: Secondary | ICD-10-CM | POA: Diagnosis not present

## 2020-09-24 DIAGNOSIS — N186 End stage renal disease: Secondary | ICD-10-CM | POA: Diagnosis not present

## 2020-09-24 DIAGNOSIS — Z992 Dependence on renal dialysis: Secondary | ICD-10-CM | POA: Diagnosis not present

## 2020-09-27 ENCOUNTER — Encounter: Admit: 2020-09-27 | Discharge: 2020-09-27 | Payer: MEDICARE | Attending: Internal Medicine | Primary: Internal Medicine

## 2020-09-27 DIAGNOSIS — Z992 Dependence on renal dialysis: Secondary | ICD-10-CM | POA: Diagnosis not present

## 2020-09-27 DIAGNOSIS — N186 End stage renal disease: Secondary | ICD-10-CM | POA: Diagnosis not present

## 2020-09-29 DIAGNOSIS — Z992 Dependence on renal dialysis: Secondary | ICD-10-CM | POA: Diagnosis not present

## 2020-09-29 DIAGNOSIS — N186 End stage renal disease: Secondary | ICD-10-CM | POA: Diagnosis not present

## 2020-09-30 DIAGNOSIS — E1122 Type 2 diabetes mellitus with diabetic chronic kidney disease: Secondary | ICD-10-CM | POA: Diagnosis not present

## 2020-09-30 DIAGNOSIS — I2510 Atherosclerotic heart disease of native coronary artery without angina pectoris: Secondary | ICD-10-CM | POA: Diagnosis not present

## 2020-09-30 DIAGNOSIS — R5381 Other malaise: Secondary | ICD-10-CM | POA: Diagnosis not present

## 2020-09-30 DIAGNOSIS — Z794 Long term (current) use of insulin: Secondary | ICD-10-CM | POA: Diagnosis not present

## 2020-09-30 DIAGNOSIS — E039 Hypothyroidism, unspecified: Secondary | ICD-10-CM | POA: Diagnosis not present

## 2020-09-30 DIAGNOSIS — Z1231 Encounter for screening mammogram for malignant neoplasm of breast: Secondary | ICD-10-CM | POA: Diagnosis not present

## 2020-09-30 DIAGNOSIS — D638 Anemia in other chronic diseases classified elsewhere: Secondary | ICD-10-CM | POA: Diagnosis not present

## 2020-09-30 DIAGNOSIS — R1310 Dysphagia, unspecified: Secondary | ICD-10-CM | POA: Diagnosis not present

## 2020-09-30 DIAGNOSIS — R809 Proteinuria, unspecified: Secondary | ICD-10-CM | POA: Diagnosis not present

## 2020-09-30 DIAGNOSIS — N185 Chronic kidney disease, stage 5: Secondary | ICD-10-CM | POA: Diagnosis not present

## 2020-09-30 DIAGNOSIS — R140 Abdominal distension (gaseous): Secondary | ICD-10-CM | POA: Diagnosis not present

## 2020-10-01 DIAGNOSIS — E039 Hypothyroidism, unspecified: Secondary | ICD-10-CM | POA: Diagnosis not present

## 2020-10-01 DIAGNOSIS — N186 End stage renal disease: Secondary | ICD-10-CM | POA: Diagnosis not present

## 2020-10-01 DIAGNOSIS — R140 Abdominal distension (gaseous): Secondary | ICD-10-CM | POA: Diagnosis not present

## 2020-10-01 DIAGNOSIS — E1122 Type 2 diabetes mellitus with diabetic chronic kidney disease: Secondary | ICD-10-CM | POA: Diagnosis not present

## 2020-10-01 DIAGNOSIS — Z1231 Encounter for screening mammogram for malignant neoplasm of breast: Secondary | ICD-10-CM | POA: Diagnosis not present

## 2020-10-01 DIAGNOSIS — I2510 Atherosclerotic heart disease of native coronary artery without angina pectoris: Secondary | ICD-10-CM | POA: Diagnosis not present

## 2020-10-01 DIAGNOSIS — R5381 Other malaise: Secondary | ICD-10-CM | POA: Diagnosis not present

## 2020-10-01 DIAGNOSIS — Z992 Dependence on renal dialysis: Secondary | ICD-10-CM | POA: Diagnosis not present

## 2020-10-01 DIAGNOSIS — R809 Proteinuria, unspecified: Secondary | ICD-10-CM | POA: Diagnosis not present

## 2020-10-01 DIAGNOSIS — D638 Anemia in other chronic diseases classified elsewhere: Secondary | ICD-10-CM | POA: Diagnosis not present

## 2020-10-01 DIAGNOSIS — N185 Chronic kidney disease, stage 5: Secondary | ICD-10-CM | POA: Diagnosis not present

## 2020-10-01 DIAGNOSIS — Z7682 Awaiting organ transplant status: Principal | ICD-10-CM

## 2020-10-01 DIAGNOSIS — Z01818 Encounter for other preprocedural examination: Principal | ICD-10-CM

## 2020-10-03 DIAGNOSIS — N186 End stage renal disease: Principal | ICD-10-CM

## 2020-10-03 DIAGNOSIS — Z01810 Encounter for preprocedural cardiovascular examination: Principal | ICD-10-CM

## 2020-10-03 DIAGNOSIS — Z7289 Other problems related to lifestyle: Principal | ICD-10-CM

## 2020-10-03 DIAGNOSIS — E1122 Type 2 diabetes mellitus with diabetic chronic kidney disease: Principal | ICD-10-CM

## 2020-10-03 DIAGNOSIS — Z01818 Encounter for other preprocedural examination: Principal | ICD-10-CM

## 2020-10-04 DIAGNOSIS — N186 End stage renal disease: Secondary | ICD-10-CM | POA: Diagnosis not present

## 2020-10-04 DIAGNOSIS — Z992 Dependence on renal dialysis: Secondary | ICD-10-CM | POA: Diagnosis not present

## 2020-10-06 DIAGNOSIS — N186 End stage renal disease: Secondary | ICD-10-CM | POA: Diagnosis not present

## 2020-10-06 DIAGNOSIS — Z992 Dependence on renal dialysis: Secondary | ICD-10-CM | POA: Diagnosis not present

## 2020-10-07 ENCOUNTER — Other Ambulatory Visit: Payer: Self-pay

## 2020-10-07 ENCOUNTER — Other Ambulatory Visit: Payer: Self-pay | Admitting: Internal Medicine

## 2020-10-07 ENCOUNTER — Ambulatory Visit
Admission: RE | Admit: 2020-10-07 | Discharge: 2020-10-07 | Disposition: A | Discharge status: Home / Self Care | Payer: Medicare HMO | Source: Ambulatory Visit | Attending: Internal Medicine | Admitting: Internal Medicine

## 2020-10-07 DIAGNOSIS — Z0000 Encounter for general adult medical examination without abnormal findings: Secondary | ICD-10-CM | POA: Diagnosis not present

## 2020-10-07 DIAGNOSIS — M7989 Other specified soft tissue disorders: Secondary | ICD-10-CM | POA: Diagnosis not present

## 2020-10-07 DIAGNOSIS — I2510 Atherosclerotic heart disease of native coronary artery without angina pectoris: Secondary | ICD-10-CM | POA: Diagnosis not present

## 2020-10-07 DIAGNOSIS — Z8781 Personal history of (healed) traumatic fracture: Secondary | ICD-10-CM | POA: Diagnosis not present

## 2020-10-07 DIAGNOSIS — N186 End stage renal disease: Secondary | ICD-10-CM | POA: Diagnosis not present

## 2020-10-07 DIAGNOSIS — E1142 Type 2 diabetes mellitus with diabetic polyneuropathy: Secondary | ICD-10-CM | POA: Diagnosis not present

## 2020-10-07 DIAGNOSIS — R600 Localized edema: Secondary | ICD-10-CM | POA: Diagnosis not present

## 2020-10-07 DIAGNOSIS — D649 Anemia, unspecified: Secondary | ICD-10-CM | POA: Diagnosis not present

## 2020-10-07 DIAGNOSIS — Z992 Dependence on renal dialysis: Secondary | ICD-10-CM | POA: Diagnosis not present

## 2020-10-07 DIAGNOSIS — I132 Hypertensive heart and chronic kidney disease with heart failure and with stage 5 chronic kidney disease, or end stage renal disease: Secondary | ICD-10-CM | POA: Diagnosis not present

## 2020-10-07 DIAGNOSIS — I5022 Chronic systolic (congestive) heart failure: Secondary | ICD-10-CM | POA: Diagnosis not present

## 2020-10-07 DIAGNOSIS — Z794 Long term (current) use of insulin: Secondary | ICD-10-CM | POA: Diagnosis not present

## 2020-10-07 DIAGNOSIS — E1122 Type 2 diabetes mellitus with diabetic chronic kidney disease: Secondary | ICD-10-CM | POA: Diagnosis not present

## 2020-10-08 DIAGNOSIS — Z992 Dependence on renal dialysis: Secondary | ICD-10-CM | POA: Diagnosis not present

## 2020-10-08 DIAGNOSIS — N186 End stage renal disease: Secondary | ICD-10-CM | POA: Diagnosis not present

## 2020-10-11 DIAGNOSIS — Z992 Dependence on renal dialysis: Secondary | ICD-10-CM | POA: Diagnosis not present

## 2020-10-11 DIAGNOSIS — N186 End stage renal disease: Secondary | ICD-10-CM | POA: Diagnosis not present

## 2020-10-13 DIAGNOSIS — N186 End stage renal disease: Secondary | ICD-10-CM | POA: Diagnosis not present

## 2020-10-13 DIAGNOSIS — Z992 Dependence on renal dialysis: Secondary | ICD-10-CM | POA: Diagnosis not present

## 2020-10-15 DIAGNOSIS — N186 End stage renal disease: Secondary | ICD-10-CM | POA: Diagnosis not present

## 2020-10-15 DIAGNOSIS — Z992 Dependence on renal dialysis: Secondary | ICD-10-CM | POA: Diagnosis not present

## 2020-10-18 DIAGNOSIS — Z992 Dependence on renal dialysis: Secondary | ICD-10-CM | POA: Diagnosis not present

## 2020-10-18 DIAGNOSIS — N186 End stage renal disease: Secondary | ICD-10-CM | POA: Diagnosis not present

## 2020-10-20 DIAGNOSIS — Z992 Dependence on renal dialysis: Secondary | ICD-10-CM | POA: Diagnosis not present

## 2020-10-20 DIAGNOSIS — N186 End stage renal disease: Secondary | ICD-10-CM | POA: Diagnosis not present

## 2020-10-21 DIAGNOSIS — Z992 Dependence on renal dialysis: Secondary | ICD-10-CM | POA: Diagnosis not present

## 2020-10-21 DIAGNOSIS — N186 End stage renal disease: Secondary | ICD-10-CM | POA: Diagnosis not present

## 2020-10-22 DIAGNOSIS — N186 End stage renal disease: Secondary | ICD-10-CM | POA: Diagnosis not present

## 2020-10-22 DIAGNOSIS — Z992 Dependence on renal dialysis: Secondary | ICD-10-CM | POA: Diagnosis not present

## 2020-10-25 ENCOUNTER — Encounter: Admit: 2020-10-25 | Discharge: 2020-10-25 | Payer: MEDICARE | Attending: Internal Medicine | Primary: Internal Medicine

## 2020-10-25 DIAGNOSIS — Z992 Dependence on renal dialysis: Secondary | ICD-10-CM | POA: Diagnosis not present

## 2020-10-25 DIAGNOSIS — N186 End stage renal disease: Secondary | ICD-10-CM | POA: Diagnosis not present

## 2020-10-27 DIAGNOSIS — Z992 Dependence on renal dialysis: Secondary | ICD-10-CM | POA: Diagnosis not present

## 2020-10-27 DIAGNOSIS — N186 End stage renal disease: Secondary | ICD-10-CM | POA: Diagnosis not present

## 2020-10-29 DIAGNOSIS — Z992 Dependence on renal dialysis: Secondary | ICD-10-CM | POA: Diagnosis not present

## 2020-10-29 DIAGNOSIS — N186 End stage renal disease: Secondary | ICD-10-CM | POA: Diagnosis not present

## 2020-10-29 DIAGNOSIS — Z7682 Awaiting organ transplant status: Principal | ICD-10-CM

## 2020-10-29 DIAGNOSIS — Z01818 Encounter for other preprocedural examination: Principal | ICD-10-CM

## 2020-10-31 DIAGNOSIS — N186 End stage renal disease: Secondary | ICD-10-CM | POA: Diagnosis not present

## 2020-10-31 DIAGNOSIS — Z992 Dependence on renal dialysis: Secondary | ICD-10-CM | POA: Diagnosis not present

## 2020-11-01 DIAGNOSIS — N186 End stage renal disease: Secondary | ICD-10-CM | POA: Diagnosis not present

## 2020-11-01 DIAGNOSIS — Z992 Dependence on renal dialysis: Secondary | ICD-10-CM | POA: Diagnosis not present

## 2020-11-03 DIAGNOSIS — Z992 Dependence on renal dialysis: Secondary | ICD-10-CM | POA: Diagnosis not present

## 2020-11-03 DIAGNOSIS — N186 End stage renal disease: Secondary | ICD-10-CM | POA: Diagnosis not present

## 2020-11-05 DIAGNOSIS — Z992 Dependence on renal dialysis: Secondary | ICD-10-CM | POA: Diagnosis not present

## 2020-11-05 DIAGNOSIS — N186 End stage renal disease: Secondary | ICD-10-CM | POA: Diagnosis not present

## 2020-11-08 DIAGNOSIS — N186 End stage renal disease: Secondary | ICD-10-CM | POA: Diagnosis not present

## 2020-11-08 DIAGNOSIS — Z992 Dependence on renal dialysis: Secondary | ICD-10-CM | POA: Diagnosis not present

## 2020-11-10 DIAGNOSIS — Z992 Dependence on renal dialysis: Secondary | ICD-10-CM | POA: Diagnosis not present

## 2020-11-10 DIAGNOSIS — N186 End stage renal disease: Secondary | ICD-10-CM | POA: Diagnosis not present

## 2020-11-11 DIAGNOSIS — S8264XD Nondisplaced fracture of lateral malleolus of right fibula, subsequent encounter for closed fracture with routine healing: Secondary | ICD-10-CM | POA: Diagnosis not present

## 2020-11-12 DIAGNOSIS — Z992 Dependence on renal dialysis: Secondary | ICD-10-CM | POA: Diagnosis not present

## 2020-11-12 DIAGNOSIS — N186 End stage renal disease: Secondary | ICD-10-CM | POA: Diagnosis not present

## 2020-11-15 DIAGNOSIS — N186 End stage renal disease: Secondary | ICD-10-CM | POA: Diagnosis not present

## 2020-11-15 DIAGNOSIS — E119 Type 2 diabetes mellitus without complications: Secondary | ICD-10-CM | POA: Diagnosis not present

## 2020-11-15 DIAGNOSIS — Z992 Dependence on renal dialysis: Secondary | ICD-10-CM | POA: Diagnosis not present

## 2020-11-17 DIAGNOSIS — Z992 Dependence on renal dialysis: Secondary | ICD-10-CM | POA: Diagnosis not present

## 2020-11-17 DIAGNOSIS — N186 End stage renal disease: Secondary | ICD-10-CM | POA: Diagnosis not present

## 2020-11-19 DIAGNOSIS — Z992 Dependence on renal dialysis: Secondary | ICD-10-CM | POA: Diagnosis not present

## 2020-11-19 DIAGNOSIS — N186 End stage renal disease: Secondary | ICD-10-CM | POA: Diagnosis not present

## 2020-11-20 DIAGNOSIS — N186 End stage renal disease: Secondary | ICD-10-CM | POA: Diagnosis not present

## 2020-11-20 DIAGNOSIS — Z992 Dependence on renal dialysis: Secondary | ICD-10-CM | POA: Diagnosis not present

## 2020-11-21 DIAGNOSIS — N186 End stage renal disease: Secondary | ICD-10-CM | POA: Diagnosis not present

## 2020-11-21 DIAGNOSIS — Z992 Dependence on renal dialysis: Secondary | ICD-10-CM | POA: Diagnosis not present

## 2020-11-22 ENCOUNTER — Encounter: Admit: 2020-11-22 | Discharge: 2020-11-22 | Payer: MEDICARE | Attending: Internal Medicine | Primary: Internal Medicine

## 2020-11-22 DIAGNOSIS — Z992 Dependence on renal dialysis: Secondary | ICD-10-CM | POA: Diagnosis not present

## 2020-11-22 DIAGNOSIS — N186 End stage renal disease: Secondary | ICD-10-CM | POA: Diagnosis not present

## 2020-11-23 DIAGNOSIS — E1122 Type 2 diabetes mellitus with diabetic chronic kidney disease: Secondary | ICD-10-CM | POA: Diagnosis not present

## 2020-11-23 DIAGNOSIS — Z794 Long term (current) use of insulin: Secondary | ICD-10-CM | POA: Diagnosis not present

## 2020-11-23 DIAGNOSIS — N186 End stage renal disease: Secondary | ICD-10-CM | POA: Diagnosis not present

## 2020-11-23 DIAGNOSIS — E1142 Type 2 diabetes mellitus with diabetic polyneuropathy: Secondary | ICD-10-CM | POA: Diagnosis not present

## 2020-11-23 DIAGNOSIS — Z992 Dependence on renal dialysis: Secondary | ICD-10-CM | POA: Diagnosis not present

## 2020-11-23 DIAGNOSIS — E113412 Type 2 diabetes mellitus with severe nonproliferative diabetic retinopathy with macular edema, left eye: Secondary | ICD-10-CM | POA: Diagnosis not present

## 2020-11-23 DIAGNOSIS — E1159 Type 2 diabetes mellitus with other circulatory complications: Secondary | ICD-10-CM | POA: Diagnosis not present

## 2020-11-24 DIAGNOSIS — N186 End stage renal disease: Secondary | ICD-10-CM | POA: Diagnosis not present

## 2020-11-24 DIAGNOSIS — Z992 Dependence on renal dialysis: Secondary | ICD-10-CM | POA: Diagnosis not present

## 2020-11-26 DIAGNOSIS — Z992 Dependence on renal dialysis: Secondary | ICD-10-CM | POA: Diagnosis not present

## 2020-11-26 DIAGNOSIS — N186 End stage renal disease: Secondary | ICD-10-CM | POA: Diagnosis not present

## 2020-11-26 DIAGNOSIS — Z01818 Encounter for other preprocedural examination: Principal | ICD-10-CM

## 2020-11-26 DIAGNOSIS — Z7682 Awaiting organ transplant status: Principal | ICD-10-CM

## 2020-11-29 DIAGNOSIS — N186 End stage renal disease: Secondary | ICD-10-CM | POA: Diagnosis not present

## 2020-11-29 DIAGNOSIS — Z992 Dependence on renal dialysis: Secondary | ICD-10-CM | POA: Diagnosis not present

## 2020-12-01 DIAGNOSIS — Z992 Dependence on renal dialysis: Secondary | ICD-10-CM | POA: Diagnosis not present

## 2020-12-01 DIAGNOSIS — N186 End stage renal disease: Secondary | ICD-10-CM | POA: Diagnosis not present

## 2020-12-03 DIAGNOSIS — N186 End stage renal disease: Secondary | ICD-10-CM | POA: Diagnosis not present

## 2020-12-03 DIAGNOSIS — Z992 Dependence on renal dialysis: Secondary | ICD-10-CM | POA: Diagnosis not present

## 2020-12-06 ENCOUNTER — Emergency Department: Payer: Medicare HMO

## 2020-12-06 ENCOUNTER — Other Ambulatory Visit: Payer: Self-pay

## 2020-12-06 ENCOUNTER — Observation Stay
Admission: EM | Admit: 2020-12-06 | Discharge: 2020-12-07 | Disposition: A | Discharge status: Home / Self Care | Payer: Medicare HMO | Attending: Internal Medicine | Admitting: Internal Medicine

## 2020-12-06 DIAGNOSIS — E1142 Type 2 diabetes mellitus with diabetic polyneuropathy: Secondary | ICD-10-CM | POA: Diagnosis not present

## 2020-12-06 DIAGNOSIS — Z992 Dependence on renal dialysis: Secondary | ICD-10-CM | POA: Diagnosis not present

## 2020-12-06 DIAGNOSIS — J810 Acute pulmonary edema: Secondary | ICD-10-CM | POA: Diagnosis not present

## 2020-12-06 DIAGNOSIS — R1013 Epigastric pain: Secondary | ICD-10-CM | POA: Insufficient documentation

## 2020-12-06 DIAGNOSIS — R1011 Right upper quadrant pain: Secondary | ICD-10-CM | POA: Diagnosis not present

## 2020-12-06 DIAGNOSIS — Z794 Long term (current) use of insulin: Secondary | ICD-10-CM | POA: Diagnosis not present

## 2020-12-06 DIAGNOSIS — I132 Hypertensive heart and chronic kidney disease with heart failure and with stage 5 chronic kidney disease, or end stage renal disease: Secondary | ICD-10-CM | POA: Insufficient documentation

## 2020-12-06 DIAGNOSIS — N186 End stage renal disease: Secondary | ICD-10-CM | POA: Diagnosis not present

## 2020-12-06 DIAGNOSIS — E119 Type 2 diabetes mellitus without complications: Secondary | ICD-10-CM

## 2020-12-06 DIAGNOSIS — I2510 Atherosclerotic heart disease of native coronary artery without angina pectoris: Secondary | ICD-10-CM | POA: Diagnosis not present

## 2020-12-06 DIAGNOSIS — N2581 Secondary hyperparathyroidism of renal origin: Secondary | ICD-10-CM | POA: Diagnosis not present

## 2020-12-06 DIAGNOSIS — R519 Headache, unspecified: Secondary | ICD-10-CM | POA: Insufficient documentation

## 2020-12-06 DIAGNOSIS — I509 Heart failure, unspecified: Secondary | ICD-10-CM | POA: Diagnosis not present

## 2020-12-06 DIAGNOSIS — R0602 Shortness of breath: Secondary | ICD-10-CM | POA: Diagnosis not present

## 2020-12-06 DIAGNOSIS — E039 Hypothyroidism, unspecified: Secondary | ICD-10-CM | POA: Insufficient documentation

## 2020-12-06 DIAGNOSIS — I10 Essential (primary) hypertension: Secondary | ICD-10-CM | POA: Diagnosis not present

## 2020-12-06 DIAGNOSIS — E875 Hyperkalemia: Principal | ICD-10-CM | POA: Insufficient documentation

## 2020-12-06 DIAGNOSIS — Z87891 Personal history of nicotine dependence: Secondary | ICD-10-CM | POA: Insufficient documentation

## 2020-12-06 DIAGNOSIS — Z79899 Other long term (current) drug therapy: Secondary | ICD-10-CM | POA: Insufficient documentation

## 2020-12-06 DIAGNOSIS — Z9861 Coronary angioplasty status: Secondary | ICD-10-CM | POA: Diagnosis not present

## 2020-12-06 DIAGNOSIS — E1122 Type 2 diabetes mellitus with diabetic chronic kidney disease: Secondary | ICD-10-CM | POA: Insufficient documentation

## 2020-12-06 DIAGNOSIS — Z7982 Long term (current) use of aspirin: Secondary | ICD-10-CM | POA: Diagnosis not present

## 2020-12-06 DIAGNOSIS — I160 Hypertensive urgency: Secondary | ICD-10-CM | POA: Diagnosis present

## 2020-12-06 DIAGNOSIS — N210 Calculus in bladder: Secondary | ICD-10-CM | POA: Diagnosis not present

## 2020-12-06 DIAGNOSIS — I120 Hypertensive chronic kidney disease with stage 5 chronic kidney disease or end stage renal disease: Secondary | ICD-10-CM | POA: Diagnosis not present

## 2020-12-06 DIAGNOSIS — I517 Cardiomegaly: Secondary | ICD-10-CM | POA: Diagnosis not present

## 2020-12-06 DIAGNOSIS — N200 Calculus of kidney: Secondary | ICD-10-CM | POA: Diagnosis not present

## 2020-12-06 DIAGNOSIS — Z20822 Contact with and (suspected) exposure to covid-19: Secondary | ICD-10-CM | POA: Diagnosis not present

## 2020-12-06 DIAGNOSIS — R110 Nausea: Secondary | ICD-10-CM | POA: Diagnosis not present

## 2020-12-06 LAB — COMPREHENSIVE METABOLIC PANEL
ALT: 16 U/L (ref 0–44)
AST: 18 U/L (ref 15–41)
Albumin: 3.4 g/dL — ABNORMAL LOW (ref 3.5–5.0)
Alkaline Phosphatase: 58 U/L (ref 38–126)
Anion gap: 12 (ref 5–15)
BUN: 47 mg/dL — ABNORMAL HIGH (ref 8–23)
CO2: 27 mmol/L (ref 22–32)
Calcium: 10.8 mg/dL — ABNORMAL HIGH (ref 8.9–10.3)
Chloride: 97 mmol/L — ABNORMAL LOW (ref 98–111)
Creatinine, Ser: 9.55 mg/dL — ABNORMAL HIGH (ref 0.44–1.00)
GFR, Estimated: 4 mL/min — ABNORMAL LOW (ref >60)
Glucose, Bld: 132 mg/dL — ABNORMAL HIGH (ref 70–99)
Potassium: 6.5 mmol/L (ref 3.5–5.1)
Sodium: 136 mmol/L (ref 135–145)
Total Bilirubin: 0.7 mg/dL (ref 0.3–1.2)
Total Protein: 6.1 g/dL — ABNORMAL LOW (ref 6.5–8.1)

## 2020-12-06 LAB — HEMOGLOBIN A1C
Hgb A1c MFr Bld: 5.7 % — ABNORMAL HIGH (ref 4.8–5.6)
Mean Plasma Glucose: 116.89 mg/dL

## 2020-12-06 LAB — GLUCOSE, CAPILLARY
Glucose-Capillary: 87 mg/dL (ref 70–99)
Glucose-Capillary: 132 mg/dL — ABNORMAL HIGH (ref 70–99)
Glucose-Capillary: 120 mg/dL — ABNORMAL HIGH (ref 70–99)

## 2020-12-06 LAB — TROPONIN I (HIGH SENSITIVITY)
Troponin I (High Sensitivity): 28 ng/L — ABNORMAL HIGH (ref <18)
Troponin I (High Sensitivity): 27 ng/L — ABNORMAL HIGH (ref <18)
Troponin I (High Sensitivity): 25 ng/L — ABNORMAL HIGH (ref <18)
Troponin I (High Sensitivity): 25 ng/L — ABNORMAL HIGH (ref <18)

## 2020-12-06 LAB — CBC WITH DIFFERENTIAL/PLATELET
Abs Immature Granulocytes: 0.02 10*3/uL (ref 0.00–0.07)
Basophils Absolute: 0 10*3/uL (ref 0.0–0.1)
Basophils Relative: 0 %
Eosinophils Absolute: 0.1 10*3/uL (ref 0.0–0.5)
Eosinophils Relative: 2 %
HCT: 40.5 % (ref 36.0–46.0)
Hemoglobin: 13.9 g/dL (ref 12.0–15.0)
Immature Granulocytes: 0 %
Lymphocytes Relative: 17 %
Lymphs Abs: 1 10*3/uL (ref 0.7–4.0)
MCH: 32.4 pg (ref 26.0–34.0)
MCHC: 34.3 g/dL (ref 30.0–36.0)
MCV: 94.4 fL (ref 80.0–100.0)
Monocytes Absolute: 0.3 10*3/uL (ref 0.1–1.0)
Monocytes Relative: 5 %
Neutro Abs: 4.6 10*3/uL (ref 1.7–7.7)
Neutrophils Relative %: 76 %
Platelets: 97 10*3/uL — ABNORMAL LOW (ref 150–400)
RBC: 4.29 MIL/uL (ref 3.87–5.11)
RDW: 13.4 % (ref 11.5–15.5)
WBC: 6.1 10*3/uL (ref 4.0–10.5)
nRBC: 0 % (ref 0.0–0.2)

## 2020-12-06 LAB — LIPASE, BLOOD: Lipase: 39 U/L (ref 11–51)

## 2020-12-06 LAB — SEDIMENTATION RATE: Sed Rate: 6 mm/hr (ref 0–22)

## 2020-12-06 LAB — POC SARS CORONAVIRUS 2 AG -  ED: SARSCOV2ONAVIRUS 2 AG: NEGATIVE

## 2020-12-06 LAB — RESP PANEL BY RT-PCR (FLU A&B, COVID) ARPGX2
Influenza A by PCR: NEGATIVE
Influenza B by PCR: NEGATIVE
SARS Coronavirus 2 by RT PCR: NEGATIVE

## 2020-12-06 MED ORDER — ASPIRIN EC 81 MG PO TBEC
81.0000 mg | DELAYED_RELEASE_TABLET | Freq: Every day | ORAL | Status: DC
  Administered 2020-12-07: 81 mg via ORAL
  Filled 2020-12-06: qty 1

## 2020-12-06 MED ORDER — CALCIUM GLUCONATE 10 % IV SOLN
1.0000 g | Freq: Once | INTRAVENOUS | Status: AC
  Administered 2020-12-06: 1 g via INTRAVENOUS

## 2020-12-06 MED ORDER — CALCIUM ACETATE (PHOS BINDER) 667 MG PO CAPS
2001.0000 mg | ORAL_CAPSULE | Freq: Three times a day (TID) | ORAL | Status: DC
  Administered 2020-12-07: 2001 mg via ORAL
  Filled 2020-12-06 (×4): qty 3

## 2020-12-06 MED ORDER — ACETAMINOPHEN 650 MG RE SUPP: 650.0000 mg | Freq: Four times a day (QID) | RECTAL | Status: DC | PRN

## 2020-12-06 MED ORDER — IRBESARTAN 75 MG PO TABS
75.0000 mg | ORAL_TABLET | Freq: Every day | ORAL | Status: DC
  Filled 2020-12-06: qty 1

## 2020-12-06 MED ORDER — SODIUM CHLORIDE 0.9 % IV SOLN: 100.0000 mL | INTRAVENOUS | Status: DC | PRN

## 2020-12-06 MED ORDER — LIDOCAINE-PRILOCAINE 2.5-2.5 % EX CREA
1.0000 "application " | TOPICAL_CREAM | CUTANEOUS | Status: DC | PRN
  Filled 2020-12-06: qty 5

## 2020-12-06 MED ORDER — PANTOPRAZOLE SODIUM 40 MG PO TBEC
40.0000 mg | DELAYED_RELEASE_TABLET | Freq: Every day | ORAL | Status: DC
  Administered 2020-12-07: 40 mg via ORAL
  Filled 2020-12-06: qty 1

## 2020-12-06 MED ORDER — INSULIN ASPART 100 UNIT/ML IJ SOLN: 0.0000 [IU] | Freq: Three times a day (TID) | INTRAMUSCULAR | Status: DC

## 2020-12-06 MED ORDER — PRAVASTATIN SODIUM 40 MG PO TABS
80.0000 mg | ORAL_TABLET | Freq: Every day | ORAL | Status: DC
  Administered 2020-12-06: 80 mg via ORAL
  Filled 2020-12-06 (×2): qty 2

## 2020-12-06 MED ORDER — DIALYVITE 800/ULTRA D PO TABS: 1.0000 | ORAL_TABLET | Freq: Every day | ORAL | Status: DC

## 2020-12-06 MED ORDER — HYDRALAZINE HCL 50 MG PO TABS
50.0000 mg | ORAL_TABLET | Freq: Two times a day (BID) | ORAL | Status: DC
  Administered 2020-12-06: 50 mg via ORAL
  Filled 2020-12-06: qty 1

## 2020-12-06 MED ORDER — LIDOCAINE (ANORECTAL) 5 % EX CREA: 1.0000 "application " | TOPICAL_CREAM | CUTANEOUS | Status: DC | PRN

## 2020-12-06 MED ORDER — OXYCODONE HCL 5 MG PO TABS
5.0000 mg | ORAL_TABLET | Freq: Four times a day (QID) | ORAL | Status: DC | PRN
Start: 2020-12-06 — End: 2020-12-07
  Filled 2020-12-06: qty 1

## 2020-12-06 MED ORDER — FUROSEMIDE 40 MG PO TABS
80.0000 mg | ORAL_TABLET | Freq: Every day | ORAL | Status: DC
  Administered 2020-12-07: 80 mg via ORAL
  Filled 2020-12-06: qty 2

## 2020-12-06 MED ORDER — LABETALOL HCL 5 MG/ML IV SOLN
20.0000 mg | Freq: Once | INTRAVENOUS | Status: AC
  Administered 2020-12-06: 20 mg via INTRAVENOUS
  Filled 2020-12-06: qty 4

## 2020-12-06 MED ORDER — HEPARIN SODIUM (PORCINE) 1000 UNIT/ML DIALYSIS
1000.0000 [IU] | INTRAMUSCULAR | Status: DC | PRN
  Filled 2020-12-06: qty 1

## 2020-12-06 MED ORDER — CHLORHEXIDINE GLUCONATE 4 % EX LIQD: Freq: Every day | CUTANEOUS | Status: DC

## 2020-12-06 MED ORDER — LIDOCAINE HCL (PF) 1 % IJ SOLN
5.0000 mL | INTRAMUSCULAR | Status: DC | PRN
  Filled 2020-12-06: qty 5

## 2020-12-06 MED ORDER — SODIUM CHLORIDE 0.9% FLUSH
3.0000 mL | Freq: Two times a day (BID) | INTRAVENOUS | Status: DC
  Administered 2020-12-06 – 2020-12-07 (×3): 3 mL via INTRAVENOUS

## 2020-12-06 MED ORDER — AMLODIPINE BESYLATE 5 MG PO TABS: 5.0000 mg | ORAL_TABLET | Freq: Every day | ORAL | Status: DC

## 2020-12-06 MED ORDER — CARVEDILOL 25 MG PO TABS
25.0000 mg | ORAL_TABLET | Freq: Two times a day (BID) | ORAL | Status: DC
  Administered 2020-12-06: 25 mg via ORAL
  Filled 2020-12-06: qty 1

## 2020-12-06 MED ORDER — HYDRALAZINE HCL 20 MG/ML IJ SOLN
10.0000 mg | Freq: Four times a day (QID) | INTRAMUSCULAR | Status: DC | PRN
  Administered 2020-12-06: 10 mg via INTRAVENOUS
  Filled 2020-12-06: qty 1

## 2020-12-06 MED ORDER — UREA 20 % EX CREA: 1.0000 "application " | TOPICAL_CREAM | Freq: Every day | CUTANEOUS | Status: DC | PRN

## 2020-12-06 MED ORDER — INSULIN ASPART 100 UNIT/ML IV SOLN
10.0000 [IU] | Freq: Once | INTRAVENOUS | Status: AC
  Administered 2020-12-06: 10 [IU] via INTRAVENOUS
  Filled 2020-12-06: qty 0.1

## 2020-12-06 MED ORDER — SODIUM BICARBONATE 8.4 % IV SOLN
50.0000 meq | Freq: Once | INTRAVENOUS | Status: AC
  Administered 2020-12-06: 50 meq via INTRAVENOUS

## 2020-12-06 MED ORDER — CALCITRIOL 0.25 MCG PO CAPS
0.5000 ug | ORAL_CAPSULE | Freq: Every day | ORAL | Status: DC
  Administered 2020-12-07: 0.5 ug via ORAL
  Filled 2020-12-06: qty 2

## 2020-12-06 MED ORDER — DEXTROSE 50 % IV SOLN
1.0000 | Freq: Once | INTRAVENOUS | Status: AC
  Administered 2020-12-06: 50 mL via INTRAVENOUS

## 2020-12-06 MED ORDER — PENTAFLUOROPROP-TETRAFLUOROETH EX AERO
1.0000 "application " | INHALATION_SPRAY | CUTANEOUS | Status: DC | PRN
  Filled 2020-12-06: qty 30

## 2020-12-06 MED ORDER — SEVELAMER CARBONATE 800 MG PO TABS
1600.0000 mg | ORAL_TABLET | Freq: Three times a day (TID) | ORAL | Status: DC
  Administered 2020-12-06 – 2020-12-07 (×2): 1600 mg via ORAL
  Filled 2020-12-06 (×4): qty 2

## 2020-12-06 MED ORDER — ALTEPLASE 2 MG IJ SOLR
2.0000 mg | Freq: Once | INTRAMUSCULAR | Status: DC | PRN
  Filled 2020-12-06: qty 2

## 2020-12-06 MED ORDER — SODIUM CHLORIDE 0.9 % IV SOLN: 250.0000 mL | INTRAVENOUS | Status: DC | PRN

## 2020-12-06 MED ORDER — SODIUM CHLORIDE 0.9% FLUSH: 3.0000 mL | INTRAVENOUS | Status: DC | PRN

## 2020-12-06 MED ORDER — POLYSACCHARIDE IRON COMPLEX 150 MG PO CAPS
150.0000 mg | ORAL_CAPSULE | Freq: Every day | ORAL | Status: DC
  Administered 2020-12-07: 150 mg via ORAL
  Filled 2020-12-06: qty 1

## 2020-12-06 MED ORDER — ACETAMINOPHEN 325 MG PO TABS
650.0000 mg | ORAL_TABLET | Freq: Four times a day (QID) | ORAL | Status: DC | PRN
  Administered 2020-12-07: 650 mg via ORAL
  Filled 2020-12-06: qty 2

## 2020-12-06 MED ORDER — ONDANSETRON HCL 4 MG/2ML IJ SOLN: 4.0000 mg | Freq: Four times a day (QID) | INTRAMUSCULAR | Status: DC | PRN

## 2020-12-06 MED ORDER — LEVOTHYROXINE SODIUM 112 MCG PO TABS
112.0000 ug | ORAL_TABLET | Freq: Every day | ORAL | Status: DC
  Administered 2020-12-07: 112 ug via ORAL
  Filled 2020-12-06: qty 1

## 2020-12-06 MED ORDER — ONDANSETRON HCL 4 MG PO TABS
4.0000 mg | ORAL_TABLET | Freq: Four times a day (QID) | ORAL | Status: DC | PRN
Start: 2020-12-06 — End: 2020-12-07

## 2020-12-06 MED ORDER — HEPARIN SODIUM (PORCINE) 5000 UNIT/ML IJ SOLN
5000.0000 [IU] | Freq: Three times a day (TID) | INTRAMUSCULAR | Status: DC
  Administered 2020-12-06 – 2020-12-07 (×2): 5000 [IU] via SUBCUTANEOUS
  Filled 2020-12-06 (×2): qty 1

## 2020-12-06 NOTE — Progress Notes (Addendum)
[facility]  ROUNDING NOTE   Subjective:   Bethany Lee is a 70 y.o. female with medical conditions including diabetes, CHF, hypertension and ESRD on HD. She presents to the ED with shortness of breath, headache and elevated blood pressure.   She states she has not felt well all weekend, but had nausea this morning. She was unable to take BP medications. She denies pain or discomfort. She says she was able to eat small meals over weekend but that decreased over the weekend also. She was unable to eat this morning. Denies shortness of breath. States she has become weaker over the weekend. States she noticed the headache this morning.   Objective:  Vital signs in last 24 hours:  Temp:  [98.5 F (36.9 C)] 98.5 F (36.9 C) (05/16 0823) Pulse Rate:  [65-70] 66 (05/16 1215) Resp:  [14-22] 20 (05/16 1030) BP: (179-246)/(67-89) 179/67 (05/16 1215) SpO2:  [94 %-97 %] 94 % (05/16 1117) Weight:  [60.3 kg] 60.3 kg (05/16 0821)  Weight change:  Filed Weights   12/06/20 0821  Weight: 60.3 kg    Intake/Output: No intake/output data recorded.   Intake/Output this shift:  No intake/output data recorded.  Physical Exam: General: NAD, laying in bed  Head: Normocephalic, atraumatic. Moist oral mucosal membranes  Eyes: Anicteric  Lungs:  B/l creackles, normal breathing effort  Heart: Regular rate and rhythm  Abdomen:  Soft, nontender   Extremities:  no peripheral edema.  Neurologic: Alert, moving all four extremities  Skin: No lesions  Access: LUE AVF    Basic Metabolic Panel: Recent Labs  Lab 12/06/20 0943  NA 136  K 6.5*  CL 97*  CO2 27  GLUCOSE 132*  BUN 47*  CREATININE 9.55*  CALCIUM 10.8*    Liver Function Tests: Recent Labs  Lab 12/06/20 0943  AST 18  ALT 16  ALKPHOS 58  BILITOT 0.7  PROT 6.1*  ALBUMIN 3.4*   Recent Labs  Lab 12/06/20 0943  LIPASE 39   No results for input(s): AMMONIA in the last 168 hours.  CBC: Recent Labs  Lab  12/06/20 0943  WBC 6.1  NEUTROABS 4.6  HGB 13.9  HCT 40.5  MCV 94.4  PLT 97*    Cardiac Enzymes: No results for input(s): CKTOTAL, CKMB, CKMBINDEX, TROPONINI in the last 168 hours.  BNP: Invalid input(s): POCBNP  CBG: No results for input(s): GLUCAP in the last 168 hours.  Microbiology: Results for orders placed or performed during the hospital encounter of 12/06/20  Resp Panel by RT-PCR (Flu A&B, Covid) Nasopharyngeal Swab     Status: None   Collection Time: 12/06/20 10:36 AM   Specimen: Nasopharyngeal Swab; Nasopharyngeal(NP) swabs in vial transport medium  Result Value Ref Range Status   SARS Coronavirus 2 by RT PCR NEGATIVE NEGATIVE Final    Comment: (NOTE) SARS-CoV-2 target nucleic acids are NOT DETECTED.  The SARS-CoV-2 RNA is generally detectable in upper respiratory specimens during the acute phase of infection. The lowest concentration of SARS-CoV-2 viral copies this assay can detect is 138 copies/mL. A negative result does not preclude SARS-Cov-2 infection and should not be used as the sole basis for treatment or other patient management decisions. A negative result may occur with  improper specimen collection/handling, submission of specimen other than nasopharyngeal swab, presence of viral mutation(s) within the areas targeted by this assay, and inadequate number of viral copies(<138 copies/mL). A negative result must be combined with clinical observations, patient history, and epidemiological information. The  expected result is Negative.  Fact Sheet for Patients:  EntrepreneurPulse.com.au  Fact Sheet for Healthcare Providers:  IncredibleEmployment.be  This test is no t yet approved or cleared by the [place] FDA and  has been authorized for detection and/or diagnosis of SARS-CoV-2 by FDA under an Emergency Use Authorization (EUA). This EUA will remain  in effect (meaning this test can be used) for the duration of  the COVID-19 declaration under Section 564(b)(1) of the Act, 21 U.S.C.section 360bbb-3(b)(1), unless the authorization is terminated  or revoked sooner.       Influenza A by PCR NEGATIVE NEGATIVE Final   Influenza B by PCR NEGATIVE NEGATIVE Final    Comment: (NOTE) The Xpert Xpress SARS-CoV-2/FLU/RSV plus assay is intended as an aid in the diagnosis of influenza from Nasopharyngeal swab specimens and should not be used as a sole basis for treatment. Nasal washings and aspirates are unacceptable for Xpert Xpress SARS-CoV-2/FLU/RSV testing.  Fact Sheet for Patients: EntrepreneurPulse.com.au  Fact Sheet for Healthcare Providers: IncredibleEmployment.be  This test is not yet approved or cleared by the [place] FDA and has been authorized for detection and/or diagnosis of SARS-CoV-2 by FDA under an Emergency Use Authorization (EUA). This EUA will remain in effect (meaning this test can be used) for the duration of the COVID-19 declaration under Section 564(b)(1) of the Act, 21 U.S.C. section 360bbb-3(b)(1), unless the authorization is terminated or revoked.  Performed at [facility], [address] [place], [place] [address]     Coagulation Studies: No results for input(s): LABPROT, INR in the last 72 hours.  Urinalysis: No results for input(s): COLORURINE, LABSPEC, PHURINE, GLUCOSEU, HGBUR, BILIRUBINUR, KETONESUR, PROTEINUR, UROBILINOGEN, NITRITE, LEUKOCYTESUR in the last 72 hours.  Invalid input(s): APPERANCEUR    Imaging: CT ABDOMEN PELVIS WO CONTRAST  Result Date: 12/06/2020 CLINICAL DATA:  Nausea, shortness of breath EXAM: CT ABDOMEN AND PELVIS WITHOUT CONTRAST TECHNIQUE: Multidetector CT imaging of the abdomen and pelvis was performed following the standard protocol without IV contrast. COMPARISON:  05/04/2016 FINDINGS: Lower chest: Cardiomegaly. Densely calcified visualized right coronary artery and aorta. No acute  abnormality. Hepatobiliary: No focal hepatic abnormality. Gallbladder unremarkable. Pancreas: No focal abnormality or ductal dilatation. Spleen: No focal abnormality.  Normal size. Adrenals/Urinary Tract: 3 mm stone layering in the urinary bladder. Bilateral nonobstructing renal stones. No hydronephrosis. Bilateral perinephric stranding is similar to prior study. Adrenal glands unremarkable. Stomach/Bowel: Normal appendix. Stomach, large and small bowel grossly unremarkable. Vascular/Lymphatic: Heavily calcified aorta and iliac vessels. No evidence of aneurysm or adenopathy. Reproductive: Prior hysterectomy.  No adnexal masses. Other: Trace free fluid in the pelvis.  No free air. Musculoskeletal: No acute bony abnormality. IMPRESSION: 3 mm bladder stone.  No ureteral stones or hydronephrosis. Bilateral nephrolithiasis, nonobstructing. Cardiomegaly, coronary artery disease. Aortic atherosclerosis. Trace free fluid in the pelvis. Electronically Signed   By: Rolm Baptise M.D.   On: 12/06/2020 09:42   DG Chest 2 View  Result Date: 12/06/2020 CLINICAL DATA:  Shortness of breath EXAM: CHEST - 2 VIEW COMPARISON:  04/10/2019 FINDINGS: Cardiomegaly, vascular congestion. Diffuse interstitial prominence is similar to prior study could reflect chronic lung disease or interstitial edema. No confluent opacities or effusions. Aortic atherosclerosis. No acute bony abnormality. IMPRESSION: Cardiomegaly, vascular congestion. Interstitial prominence could reflect chronic lung disease or interstitial edema. Electronically Signed   By: Rolm Baptise M.D.   On: 12/06/2020 09:20   CT Head Wo Contrast  Result Date: 12/06/2020 CLINICAL DATA:  Headache EXAM: CT HEAD WITHOUT CONTRAST TECHNIQUE: Contiguous  axial images were obtained from the base of the skull through the vertex without intravenous contrast. COMPARISON:  04/10/2019 FINDINGS: Brain: No acute intracranial abnormality. Specifically, no hemorrhage, hydrocephalus, mass  lesion, acute infarction, or significant intracranial injury. Vascular: No hyperdense vessel or unexpected calcification. Skull: No acute calvarial abnormality. Sinuses/Orbits: No acute findings Other: None IMPRESSION: No acute intracranial abnormality. Electronically Signed   By: Rolm Baptise M.D.   On: 12/06/2020 09:36   US ABDOMEN LIMITED RUQ (LIVER/GB)  Result Date: 12/06/2020 CLINICAL DATA:  70 year old female with right upper quadrant pain EXAM: ULTRASOUND ABDOMEN LIMITED RIGHT UPPER QUADRANT COMPARISON:  CT abdomen pelvis from 12/06/2020 FINDINGS: Gallbladder: No gallstones, pericholecystic fluid, or gallbladder wall thickening visualized. No sonographic Murphy sign noted by sonographer. Common bile duct: Diameter: 3.5 mm Liver: No focal lesion identified. Within normal limits in parenchymal echogenicity and echotexture. Portal vein is patent on color Doppler imaging with normal direction of blood flow towards the liver. Other: No perihepatic ascites. IMPRESSION: No acute sonographic abnormality in the right upper quadrant. Electronically Signed   By: Ruthann Cancer MD   On: 12/06/2020 10:01     Medications:   . sodium chloride    . sodium chloride    . sodium chloride     . amLODipine  5 mg Oral Daily  . [START ON 12/07/2020] aspirin EC  81 mg Oral Daily  . [START ON 12/07/2020] calcitRIOL  0.5 mcg Oral Daily  . calcium acetate  2,001 mg Oral TID WC  . calcium gluconate  1 g Intravenous Once  . carvedilol  25 mg Oral BID WC  . chlorhexidine   Topical Q0600  . insulin aspart  10 Units Intravenous Once   And  . dextrose  1 ampule Intravenous Once  . furosemide  80 mg Oral Daily  . heparin  5,000 Units Subcutaneous Q8H  . hydrALAZINE  50 mg Oral BID  . insulin aspart  0-9 Units Subcutaneous TID WC  . [START ON 12/07/2020] iron polysaccharides  150 mg Oral Daily  . [START ON 12/07/2020] levothyroxine  112 mcg Oral QAC breakfast  . pantoprazole  40 mg Oral Daily  . pravastatin  80 mg  Oral q1800  . sevelamer carbonate  1,600 mg Oral TID WC  . sodium bicarbonate  50 mEq Intravenous Once  . sodium chloride flush  3 mL Intravenous Q12H   sodium chloride, sodium chloride, sodium chloride, acetaminophen **OR** acetaminophen, alteplase, heparin, lidocaine (PF), lidocaine-prilocaine, ondansetron **OR** ondansetron (ZOFRAN) IV, oxyCODONE, pentafluoroprop-tetrafluoroeth, sodium chloride flush  Assessment/ Plan:  Bethany Lee is a 70 y.o.  female with medical conditions including diabetes, CHF, hypertension and ESRD on HD. She presents to the ED with shortness of breath, headache and elevated blood pressure.   Aldrich Raven/ MWF/ LUE AVF  1. Hypertension with chronic kidney disease and Acute pulm edema:  Not controlled. Home regimen consists of Carvedilol, Hydralazine, Irbesartan and Amlodipine Received IV Labetalol in ED Increases made to Hydralazine and Irbesartan Remains elevated, but medication changes in place with morning rounds. Will monitor for improvements  2. End stage renal disease with hyperkalemia  Received dialysis yesterday Elevated BP but stable during dialysis Potassium 4.4 this morning Will continue dialysis treatments at outpatient clinic at scheduled times  3  Anemia of chronic kidney disease  Lab Results  Component Value Date   HGB 13.9 12/06/2020  Niferex outpatient Hgb above goal  4. Secondary Hyperparathyroidism:    Lab Results  Component Value  Date   PTH 82 (H) 04/05/2018   CALCIUM 10.8 (H) 12/06/2020   PHOS 5.3 (H) 04/09/2018  Calcitriol and calcium acetate as outpatient Phosphorus, calcium and PTH above target goal Will continue binders  5 Diabetes mellitus type II with chronic kidney disease insulin dependent. Home regimen includes Novolog  Most recent hemoglobin A1c is 5.0 on 09/30/20.      LOS: 0 Kareem Aul 5/16/20221:04 PM

## 2020-12-06 NOTE — ED Notes (Signed)
Per nephrology NP, plan to  do dialysis on patient soon, hold hyperkalemia protocol for now. Dr. Joni Fears updated on plan of care as well.

## 2020-12-06 NOTE — Progress Notes (Signed)
Patient to the floor from dialysis. Patient is confused and agitated upon arrival. Per report patient has been A/O x 3. Patient's husband at the bedside endorses that this is not the patient's normal behavior. MD was made aware and came to the bedside to assess the patient. Patient blood glucose is 83. Husband is at the bedside feeding patient food that he brought from home, patient was also given juice. Will continue to monitor.

## 2020-12-06 NOTE — ED Provider Notes (Signed)
[facility] Emergency Department Provider Note  ____________________________________________  Time seen: Approximately 11:30 AM  I have reviewed the triage vital signs and the nursing notes.   HISTORY  Chief Complaint Nausea    HPI Bethany Lee is a 70 y.o. female with a history of hypertension diabetes CHF ESRD on hemodialysis who comes to the ED complaining of nausea shortness of breath malaise and generalized headache that is been going on for the last 2 days.  Did not go to dialysis today due to not feeling well.  Try to take her blood pressure at home and her automated cuff was unable to give a result due to error.  Last dialysis was 3 days ago on Friday.  No syncope or palpitations.  Symptoms are constant, moderate intensity, no aggravating or alleviating factors.       Past Medical History:  Diagnosis Date  . Anemia   . CHF (congestive heart failure) ([place])   . Chronic kidney disease    peritoneal dialysis  . Complication of anesthesia   . Coronary artery disease   . Diabetes mellitus without complication ([place])   . Dialysis patient [facility])    Peritoneal dialysis patient  . Dyspnea    doe  . Edema   . History of recent blood transfusion 01/2017  . Hypercholesterolemia   . Hypertension   . Hypothyroidism   . Neuropathy   . PONV (postoperative nausea and vomiting)   . Shingles    October-November 2017  . Steal syndrome as complication of dialysis access ([place])   . Tremors of nervous system    intermittent when taking gabapentin     Patient Active Problem List   Diagnosis Date Noted  . Hypertensive urgency   . Abdominal distention 04/07/2018  . Hyponatremia 04/07/2018  . Dyspnea 04/07/2018  . Acidosis 04/07/2018  . Steal syndrome as complication of dialysis access ([place])   . PD catheter dysfunction ([place]) 04/06/2018  . Peritoneal dialysis catheter dysfunction ([place]) 04/04/2018  . Complication from renal dialysis device 12/03/2017   . Elevated triglycerides with high cholesterol 02/21/2017  . Peripheral neuropathy 02/21/2017  . GIB (gastrointestinal bleeding) 02/10/2017  . Acute blood loss anemia   . Essential hypertension 02/02/2017  . GI bleeding 01/29/2017  . Subclavian arterial stenosis ([place]) 01/29/2017  . Acute respiratory distress   . Acute respiratory failure ([place]) 09/21/2016  . S/P drug eluting coronary stent placement 09/14/2016  . CAD (coronary artery disease) 09/07/2016  . Acute combined systolic and diastolic congestive heart failure ([place]) 08/17/2016  . Acute pulmonary edema ([place]) 07/16/2016  . Hypothyroidism 06/08/2016  . ESRD on dialysis ([place]) 12/21/2015  . Peritoneal dialysis status ([place]) 12/21/2015  . Leg swelling 12/21/2015  . Anemia of chronic disease 12/21/2015  . Diabetes mellitus ([place]) 12/21/2015  . Hyperkalemia 12/21/2015  . 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 Anemia in chronic renal disease 10/04/2015  . DM type 2 with diabetic peripheral neuropathy ([place]) 09/13/2015  . Mixed hyperlipidemia 09/13/2015  . Type 2 diabetes mellitus with diabetic nephropathy, with long-term current use of insulin ([place]) 09/13/2015  . CKD (chronic kidney disease) stage 5, GFR less than 15 ml/min (HCC) 07/09/2015  . Proteinuria 05/25/2015     Past Surgical History:  Procedure Laterality Date  . A/V FISTULAGRAM Left 01/16/2017   Procedure: A/V Fistulagram;  Surgeon: Katha Cabal, MD;  Location: [place] [facility];  Service: Cardiovascular;  Laterality: Left;  . A/V SHUNT INTERVENTION N/A 01/16/2017   Procedure: A/V Shunt  Intervention;  Surgeon: Katha Cabal, MD;  Location: [place] [facility];  Service: Cardiovascular;  Laterality: N/A;  . ABDOMINAL HYSTERECTOMY    . AV FISTULA PLACEMENT Left 11/30/2016   Procedure: ARTERIOVENOUS (AV) FISTULA CREATION ( EXPLORE FOR CREATION BRACHIOCEPHALIC);  Surgeon: Algernon Huxley, MD;  Location: [facility] ORS;  Service: Vascular;  Laterality: Left;  . CAPD INSERTION N/A  11/25/2015   Procedure: LAPAROSCOPIC INSERTION CONTINUOUS AMBULATORY PERITONEAL DIALYSIS  (CAPD) CATHETER;  Surgeon: Algernon Huxley, MD;  Location: [facility] ORS;  Service: Vascular;  Laterality: N/A;  . CAPD INSERTION N/A 01/06/2016   Procedure: LAPAROSCOPIC INSERTION CONTINUOUS AMBULATORY PERITONEAL DIALYSIS  (CAPD) CATHETER REVISION ;  Surgeon: Algernon Huxley, MD;  Location: [facility] ORS;  Service: Vascular;  Laterality: N/A;  . CAPD INSERTION N/A 10/11/2016   Procedure: LAPAROSCOPIC INSERTION CONTINUOUS AMBULATORY PERITONEAL DIALYSIS  (CAPD) CATHETER ( REVISION );  Surgeon: Algernon Huxley, MD;  Location: [facility] ORS;  Service: Vascular;  Laterality: N/A;  . CARDIAC CATHETERIZATION    . CATARACT EXTRACTION W/PHACO Right 05/25/2016   Procedure: CATARACT EXTRACTION PHACO AND INTRAOCULAR LENS PLACEMENT (IOC);  Surgeon: Eulogio Bear, MD;  Location: [facility] ORS;  Service: Ophthalmology;  Laterality: Right;  Lot # [id number] H [place]: 00:5.2 AP%: 11.3 CDE: 7.21  . CATARACT EXTRACTION W/PHACO Left 12/06/2017   Procedure: CATARACT EXTRACTION PHACO AND INTRAOCULAR LENS PLACEMENT (IOC);  Surgeon: Eulogio Bear, MD;  Location: [facility] ORS;  Service: Ophthalmology;  Laterality: Left;  [place] 00.23.4 AP% 7.9 CDE 1.83 Fluid Pack Lot # [id number]  . COLONOSCOPY WITH PROPOFOL N/A 02/15/2017   Procedure: COLONOSCOPY WITH PROPOFOL;  Surgeon: [name], MD;  Location: [facility] ENDOSCOPY;  Service: Gastroenterology;  Laterality: N/A;  . CORONARY ANGIOPLASTY     STENT  . CORONARY STENT INTERVENTION N/A 09/07/2016   Procedure: Coronary Stent Intervention;  Surgeon: Isaias Cowman, MD;  Location: [place] [facility];  Service: Cardiovascular;  Laterality: N/A;  . DIALYSIS/PERMA CATHETER INSERTION N/A 09/21/2016   Procedure: Dialysis/Perma Catheter Insertion;  Surgeon: Algernon Huxley, MD;  Location: [place] [facility];  Service: Cardiovascular;  Laterality: N/A;  . DIALYSIS/PERMA CATHETER REMOVAL Right 11/30/2016   Procedure: DIALYSIS/PERMA CATHETER  REMOVAL;  Surgeon: Algernon Huxley, MD;  Location: [facility] ORS;  Service: Vascular;  Laterality: Right;  . ESOPHAGOGASTRODUODENOSCOPY (EGD) WITH PROPOFOL N/A 02/15/2017   Procedure: ESOPHAGOGASTRODUODENOSCOPY (EGD) WITH PROPOFOL;  Surgeon: [name], MD;  Location: [facility] ENDOSCOPY;  Service: Gastroenterology;  Laterality: N/A;  . EYE SURGERY    . GIVENS CAPSULE STUDY N/A 02/26/2017   Procedure: GIVENS CAPSULE STUDY;  Surgeon: [name], MD;  Location: [facility] ENDOSCOPY;  Service: Gastroenterology;  Laterality: N/A;  . INSERTION OF DIALYSIS CATHETER N/A 04/07/2018   Procedure: INSERTION OF DIALYSIS CATHETER;  Surgeon: Rosetta Posner, MD;  Location: [facility];  Service: Vascular;  Laterality: N/A;  . LEFT HEART CATH AND CORONARY ANGIOGRAPHY Left 09/07/2016   Procedure: Left Heart Cath and Coronary Angiography;  Surgeon: Isaias Cowman, MD;  Location: [place] [facility];  Service: Cardiovascular;  Laterality: Left;  . LIGATION OF ARTERIOVENOUS  FISTULA Left 02/07/2017   Procedure: LIGATION OF ARTERIOVENOUS  FISTULA ( BANDING BRACHIAL CEPHALIC );  Surgeon: Katha Cabal, MD;  Location: [facility] ORS;  Service: Vascular;  Laterality: Left;  . TUBAL LIGATION    . UPPER EXTREMITY ANGIOGRAPHY Left 01/19/2017   Procedure: Upper Extremity Angiography;  Surgeon: Katha Cabal, MD;  Location: [place] [facility];  Service: Cardiovascular;  Laterality: Left;  Prior to Admission medications   Medication Sig Start Date End Date Taking? Authorizing Provider  amLODipine (NORVASC) 5 MG tablet Take 5 mg by mouth daily. 03/29/19  Yes [provider]  aspirin EC 81 MG tablet Take 1 tablet (81 mg total) by mouth daily. Start taking after NOV 15 Patient taking differently: Take 81 mg by mouth daily. 05/26/15  Yes Murlean Iba, MD  calcitRIOL (ROCALTROL) 0.5 MCG capsule Take 0.5 mcg by mouth daily. 02/14/19  Yes [provider]  calcium acetate (PHOSLO) 667 MG capsule Take 2,001 mg by mouth 3 (three)  times daily with meals.   Yes [provider]  carvedilol (COREG) 25 MG tablet Take 25 mg by mouth 2 (two) times daily with a meal. 01/13/19  Yes [provider]  furosemide (LASIX) 80 MG tablet Take 80 mg by mouth daily.   Yes [provider]  hydrALAZINE (APRESOLINE) 50 MG tablet Take 50 mg by mouth 2 (two) times daily.   Yes [provider]  insulin aspart (NOVOLOG) 100 UNIT/ML FlexPen Inject 4-8 Units into the skin 3 (three) times daily with meals. Sliding scale  Below 200 take 4 units 200-250=6 units  >250= 8 units 09/13/15  Yes [provider]  irbesartan (AVAPRO) 75 MG tablet Take 75 mg by mouth daily. 09/27/20  Yes [provider]  iron polysaccharides (NIFEREX) 150 MG capsule Take 150 mg by mouth daily.   Yes [provider]  levothyroxine (SYNTHROID, LEVOTHROID) 112 MCG tablet Take 112 mcg by mouth daily before breakfast.    Yes [provider]  lovastatin (MEVACOR) 40 MG tablet Take 40 mg by mouth at bedtime.   Yes [provider]  Multiple Vitamins-Minerals (DIALYVITE 800/ULTRA D) TABS Take 1 tablet by mouth daily.   Yes [provider]  oxyCODONE (OXY IR/ROXICODONE) 5 MG immediate release tablet Take 5 mg by mouth every 6 (six) hours as needed. 08/26/19  Yes [provider]  pantoprazole (PROTONIX) 40 MG tablet Take 1 tablet (40 mg total) by mouth 2 (two) times daily. Patient taking differently: Take 40 mg by mouth daily. 01/31/17  Yes Max Sane, MD  sevelamer carbonate (RENVELA) 800 MG tablet Take 1,600 mg by mouth 3 (three) times daily with meals.   Yes [provider]  TOUJEO SOLOSTAR 300 UNIT/ML SOPN Inject 24 Units into the skin Nightly. 02/11/19  Yes [provider]  Lidocaine, Anorectal, 5 % CREA Apply 1 application topically as needed. 06/03/18   [provider]  lisinopril (ZESTRIL) 20 MG tablet Take 20 mg by mouth daily. Patient not taking: No sig reported  02/13/19   [provider]  urea (CARMOL) 20 % cream Apply 1 application topically daily as needed (for dry skin).    [provider]     Allergies Penicillin g and Valacyclovir   Family History  Problem Relation Age of Onset  . Diabetes Mother   . Breast cancer Neg Hx     Social History Social History   Tobacco Use  . Smoking status: Former Smoker    Quit date: 10/09/2008    Years since quitting: 12.1  . Smokeless tobacco: Never Used  Vaping Use  . Vaping Use: Never used  Substance Use Topics  . Alcohol use: No  . Drug use: No    Review of Systems  Constitutional:   No fever or chills.  ENT:   No sore throat. No rhinorrhea. Cardiovascular:   No chest pain or syncope. Respiratory:  No dyspnea or cough. Gastrointestinal:   Negative for abdominal pain, vomiting and diarrhea.  Musculoskeletal:   Negative for focal pain or swelling All other systems reviewed and are negative except as documented above in ROS and HPI.  ____________________________________________   PHYSICAL EXAM:  VITAL SIGNS: ED Triage Vitals  Enc Vitals Group     BP 12/06/20 0823 (!) 241/78     Pulse Rate 12/06/20 0823 69     Resp 12/06/20 0823 17     Temp 12/06/20 0823 98.5 F (36.9 C)     Temp Source 12/06/20 0823 Oral     SpO2 12/06/20 0823 97 %     Weight 12/06/20 0821 133 lb (60.3 kg)     Height 12/06/20 0821 5' (1.524 m)     Head Circumference --      Peak Flow --      Pain Score 12/06/20 0820 0     Pain Loc --      Pain Edu? --      Excl. in [place]? --     Vital signs reviewed, nursing assessments reviewed.   Constitutional:   Alert and oriented. Non-toxic appearance. Eyes:   Conjunctivae are normal. EOMI. PERRL. ENT      Head:   Normocephalic and atraumatic.      Nose:   Wearing a mask.      Mouth/Throat:   Wearing a mask.      Neck:   No meningismus. Full ROM. Hematological/Lymphatic/Immunilogical:   No cervical lymphadenopathy. Cardiovascular:   RRR.  Symmetric bilateral radial and DP pulses.  No murmurs. Cap refill less than 2 seconds. Respiratory:   Normal respiratory effort without tachypnea/retractions. Breath sounds are clear and equal bilaterally. No wheezes/rales/rhonchi. Gastrointestinal:   Soft with generalized tenderness worse in the right upper quadrant. Non distended. There is no CVA tenderness.  No rebound, rigidity, or guarding. Genitourinary:   deferred Musculoskeletal:   Normal range of motion in all extremities. No joint effusions.  No lower extremity tenderness.  No edema. Neurologic:   Normal speech and language.  Motor grossly intact. No acute focal neurologic deficits are appreciated.  Skin:    Skin is warm, dry and intact. No rash noted.  No petechiae, purpura, or bullae.  ____________________________________________    LABS (pertinent positives/negatives) (all labs ordered are listed, but only abnormal results are displayed) Labs Reviewed  COMPREHENSIVE METABOLIC PANEL - Abnormal; Notable for the following components:      Result Value   Potassium 6.5 (*)    Chloride 97 (*)    Glucose, Bld 132 (*)    BUN 47 (*)    Creatinine, Ser 9.55 (*)    Calcium 10.8 (*)    Total Protein 6.1 (*)    Albumin 3.4 (*)    GFR, Estimated 4 (*)    All other components within normal limits  CBC WITH DIFFERENTIAL/PLATELET - Abnormal; Notable for the following components:   Platelets 97 (*)    All other components within normal limits  TROPONIN I (HIGH SENSITIVITY) - Abnormal; Notable for the following components:   Troponin I (High Sensitivity) 28 (*)    All other components within normal limits  RESP PANEL BY RT-PCR (FLU A&B, COVID) ARPGX2  LIPASE, BLOOD  SEDIMENTATION RATE  HEMOGLOBIN A1C  POC SARS CORONAVIRUS 2 AG -  ED  TROPONIN I (HIGH SENSITIVITY)  TROPONIN I (HIGH SENSITIVITY)   ____________________________________________   EKG  Interpreted by me Sinus rhythm rate of 69, normal axis and  intervals.  Poor R  wave progression.  Normal ST segments and T waves.  ____________________________________________    RADIOLOGY  CT ABDOMEN PELVIS WO CONTRAST  Result Date: 12/06/2020 CLINICAL DATA:  Nausea, shortness of breath EXAM: CT ABDOMEN AND PELVIS WITHOUT CONTRAST TECHNIQUE: Multidetector CT imaging of the abdomen and pelvis was performed following the standard protocol without IV contrast. COMPARISON:  05/04/2016 FINDINGS: Lower chest: Cardiomegaly. Densely calcified visualized right coronary artery and aorta. No acute abnormality. Hepatobiliary: No focal hepatic abnormality. Gallbladder unremarkable. Pancreas: No focal abnormality or ductal dilatation. Spleen: No focal abnormality.  Normal size. Adrenals/Urinary Tract: 3 mm stone layering in the urinary bladder. Bilateral nonobstructing renal stones. No hydronephrosis. Bilateral perinephric stranding is similar to prior study. Adrenal glands unremarkable. Stomach/Bowel: Normal appendix. Stomach, large and small bowel grossly unremarkable. Vascular/Lymphatic: Heavily calcified aorta and iliac vessels. No evidence of aneurysm or adenopathy. Reproductive: Prior hysterectomy.  No adnexal masses. Other: Trace free fluid in the pelvis.  No free air. Musculoskeletal: No acute bony abnormality. IMPRESSION: 3 mm bladder stone.  No ureteral stones or hydronephrosis. Bilateral nephrolithiasis, nonobstructing. Cardiomegaly, coronary artery disease. Aortic atherosclerosis. Trace free fluid in the pelvis. Electronically Signed   By: Rolm Baptise M.D.   On: 12/06/2020 09:42   DG Chest 2 View  Result Date: 12/06/2020 CLINICAL DATA:  Shortness of breath EXAM: CHEST - 2 VIEW COMPARISON:  04/10/2019 FINDINGS: Cardiomegaly, vascular congestion. Diffuse interstitial prominence is similar to prior study could reflect chronic lung disease or interstitial edema. No confluent opacities or effusions. Aortic atherosclerosis. No acute bony abnormality. IMPRESSION: Cardiomegaly, vascular  congestion. Interstitial prominence could reflect chronic lung disease or interstitial edema. Electronically Signed   By: Rolm Baptise M.D.   On: 12/06/2020 09:20   CT Head Wo Contrast  Result Date: 12/06/2020 CLINICAL DATA:  Headache EXAM: CT HEAD WITHOUT CONTRAST TECHNIQUE: Contiguous axial images were obtained from the base of the skull through the vertex without intravenous contrast. COMPARISON:  04/10/2019 FINDINGS: Brain: No acute intracranial abnormality. Specifically, no hemorrhage, hydrocephalus, mass lesion, acute infarction, or significant intracranial injury. Vascular: No hyperdense vessel or unexpected calcification. Skull: No acute calvarial abnormality. Sinuses/Orbits: No acute findings Other: None IMPRESSION: No acute intracranial abnormality. Electronically Signed   By: Rolm Baptise M.D.   On: 12/06/2020 09:36   US ABDOMEN LIMITED RUQ (LIVER/GB)  Result Date: 12/06/2020 CLINICAL DATA:  70 year old female with right upper quadrant pain EXAM: ULTRASOUND ABDOMEN LIMITED RIGHT UPPER QUADRANT COMPARISON:  CT abdomen pelvis from 12/06/2020 FINDINGS: Gallbladder: No gallstones, pericholecystic fluid, or gallbladder wall thickening visualized. No sonographic Murphy sign noted by sonographer. Common bile duct: Diameter: 3.5 mm Liver: No focal lesion identified. Within normal limits in parenchymal echogenicity and echotexture. Portal vein is patent on color Doppler imaging with normal direction of blood flow towards the liver. Other: No perihepatic ascites. IMPRESSION: No acute sonographic abnormality in the right upper quadrant. Electronically Signed   By: Ruthann Cancer MD   On: 12/06/2020 10:01    ____________________________________________   PROCEDURES .Critical Care Performed by: Carrie Mew, MD Authorized by: Carrie Mew, MD   Critical care provider statement:    Critical care time (minutes):  35   Critical care time was exclusive of:  Separately billable procedures and  treating other patients   Critical care was necessary to treat or prevent imminent or life-threatening deterioration of the following conditions:  Renal failure, metabolic crisis and circulatory failure   Critical care was time spent personally by me on the following  activities:  Development of treatment plan with patient or surrogate, discussions with consultants, evaluation of patient's response to treatment, examination of patient, obtaining history from patient or surrogate, ordering and performing treatments and interventions, ordering and review of laboratory studies, ordering and review of radiographic studies, pulse oximetry, re-evaluation of patient's condition and review of old charts    ____________________________________________  DIFFERENTIAL DIAGNOSIS   Intracranial hemorrhage, bowel obstruction, ileus, pancreatitis, cholecystitis, diverticulitis, viral illness, pneumonia, pulmonary edema  CLINICAL IMPRESSION / ASSESSMENT AND PLAN / ED COURSE  Medications ordered in the ED: Medications  calcium gluconate inj 10% (1 g) URGENT USE ONLY! (has no administration in time range)  insulin aspart (novoLOG) injection 10 Units (has no administration in time range)    And  dextrose 50 % solution 50 mL (has no administration in time range)  sodium bicarbonate injection 50 mEq (has no administration in time range)  chlorhexidine (HIBICLENS) 4 % liquid (has no administration in time range)  pentafluoroprop-tetrafluoroeth (GEBAUERS) aerosol 1 application (has no administration in time range)  lidocaine (PF) (XYLOCAINE) 1 % injection 5 mL (has no administration in time range)  lidocaine-prilocaine (EMLA) cream 1 application (has no administration in time range)  0.9 %  sodium chloride infusion (has no administration in time range)  0.9 %  sodium chloride infusion (has no administration in time range)  heparin injection 1,000 Units (has no administration in time range)  alteplase (CATHFLO  ACTIVASE) injection 2 mg (has no administration in time range)  aspirin EC tablet 81 mg (has no administration in time range)  oxyCODONE (Oxy IR/ROXICODONE) immediate release tablet 5 mg (has no administration in time range)  amLODipine (NORVASC) tablet 5 mg (has no administration in time range)  carvedilol (COREG) tablet 25 mg (has no administration in time range)  furosemide (LASIX) tablet 80 mg (has no administration in time range)  hydrALAZINE (APRESOLINE) tablet 50 mg (has no administration in time range)  pravastatin (PRAVACHOL) tablet 80 mg (has no administration in time range)  calcitRIOL (ROCALTROL) capsule 0.5 mcg (has no administration in time range)  levothyroxine (SYNTHROID) tablet 112 mcg (has no administration in time range)  calcium acetate (PHOSLO) capsule 2,001 mg (has no administration in time range)  pantoprazole (PROTONIX) EC tablet 40 mg (has no administration in time range)  sevelamer carbonate (RENVELA) tablet 1,600 mg (has no administration in time range)  iron polysaccharides (NIFEREX) capsule 150 mg (has no administration in time range)  heparin injection 5,000 Units (has no administration in time range)  sodium chloride flush (NS) 0.9 % injection 3 mL (has no administration in time range)  sodium chloride flush (NS) 0.9 % injection 3 mL (has no administration in time range)  0.9 %  sodium chloride infusion (has no administration in time range)  acetaminophen (TYLENOL) tablet 650 mg (has no administration in time range)    Or  acetaminophen (TYLENOL) suppository 650 mg (has no administration in time range)  ondansetron (ZOFRAN) tablet 4 mg (has no administration in time range)    Or  ondansetron (ZOFRAN) injection 4 mg (has no administration in time range)  insulin aspart (novoLOG) injection 0-9 Units (has no administration in time range)  labetalol (NORMODYNE) injection 20 mg (20 mg Intravenous Given 12/06/20 0945)    Pertinent labs & imaging results that were  available during my care of the patient were reviewed by me and considered in my medical decision making (see chart for details).  Aurie Harroun Hurley Cisco was evaluated in Emergency Department on  12/06/2020 for the symptoms described in the history of present illness. She was evaluated in the context of the global COVID-19 pandemic, which necessitated consideration that the patient might be at risk for infection with the SARS-CoV-2 virus that causes COVID-19. Institutional protocols and algorithms that pertain to the evaluation of patients at risk for COVID-19 are in a state of rapid change based on information released by regulatory bodies including the CDC and federal and state organizations. These policies and algorithms were followed during the patient's care in the ED.   Patient presents with shortness of breath, constitutional symptoms, severely elevated blood pressure.  Labs reveal potassium of 6.5 in the setting of end-stage renal disease.  Troponin only slightly elevated which is due to the ESRD and not ACS.  COVID-negative, hemoglobin stable.  CT head negative for ICH.  CT abdomen pelvis unremarkable, ultrasound right upper quadrant negative for cholecystitis.  Labetalol 20 mg IV given for severe symptomatic hypertension.  Discussed with nephrology who will arrange dialysis.  Discussed with hospitalist for further management.      ____________________________________________   FINAL CLINICAL IMPRESSION(S) / ED DIAGNOSES    Final diagnoses:  Hyperkalemia  ESRD on hemodialysis ([place])  Malignant hypertension  Type 2 diabetes mellitus with diabetic polyneuropathy, with long-term current use of insulin [facility])     ED Discharge Orders    None      Portions of this note were generated with dragon dictation software. Dictation errors may occur despite best attempts at proofreading.   Carrie Mew, MD 12/06/20 1226

## 2020-12-06 NOTE — ED Notes (Signed)
Gave report to dialysis RN. 

## 2020-12-06 NOTE — ED Triage Notes (Signed)
Pt c/o "not feeling well" today with nausea SOB, states she tried to take her b/p at home and it would not read. States she is dialysis MWF, last was Friday.

## 2020-12-06 NOTE — ED Notes (Signed)
Patient transported to CT 

## 2020-12-06 NOTE — H&P (Signed)
History and Physical    Bethany Lee CZY:[id number] DOB: [birth year] DOA: 12/06/2020  PCP: Tracie Harrier, MD   Patient coming from: Home  I have personally briefly reviewed patient's old medical records in [place]  Chief Complaint: Shortness of breath  HPI: Bethany Lee is a 70 y.o. female with medical history significant for diabetes mellitus with complications of end-stage renal disease on hemodialysis (M/W/F), coronary artery disease, hypertension, hypothyroidism and dyslipidemia who presents to the ER for evaluation of shortness of breath and nausea. Patient states that she just does not feel well and was unable to get a blood pressure reading at home. She complains of nausea and pain in the epigastric area but denies having any diarrhea, no vomiting obstipation, no fever, no chills.  She also complains of numbness in her lower extremity. She denies having any chest pain, no headache, no blurred vision, no dizziness, no lightheadedness, no palpitations, no diaphoresis, no headache. Her last dialysis was on Friday and she denies having any dietary indiscretion. Labs show sodium 136 potassium 6.5, chloride 97, bicarb 27, glucose 132, BUN 47, creatinine 1.5, calcium 10.8, alkaline phosphatase 58, albumin 3.4, lipase 39, AST 18, ALT 16, total protein 6.1, troponin 28, white count 6.1, hemoglobin 13.9, hematocrit 40.5, MCV 94.4, RDW 13.4, platelet 97 Respiratory viral panel is negative Chest x-ray reviewed by me shows cardiomegaly, vascular congestion. Interstitial prominence could reflect chronic lung disease or interstitial edema. Right upper quadrant ultrasound does not show any acute findings CT scan of abdomen pelvis without contrast shows 3 mm bladder stone.  No ureteral stones or hydronephrosis. Bilateral nephrolithiasis, nonobstructing. Cardiomegaly, coronary artery disease. Aortic atherosclerosis. Trace free fluid in the pelvis. CT scan of the head without  contrast does not show any acute findings. Twelve-lead EKG reviewed by me shows sinus rhythm with minimal ST depression in the lateral leads.    ED Course: Patient is a 70 year old female who presents to the ER for evaluation of shortness of breath and nausea.  She has a history of end-stage renal disease and her dialysis days are Monday/Wednesday/Friday. She denies having any dietary indiscretion and has not missed any scheduled dialysis treatments.  Labs reveal elevated potassium of 6.5 and chest x-ray shows vascular congestion.  Twelve-lead EKG does not show peaked T waves. She will be referred to observation status for evaluation.     Review of Systems: As per HPI otherwise all other systems reviewed and negative.    Past Medical History:  Diagnosis Date  . Anemia   . CHF (congestive heart failure) ([place])   . Chronic kidney disease    peritoneal dialysis  . Complication of anesthesia   . Coronary artery disease   . Diabetes mellitus without complication ([place])   . Dialysis patient [facility])    Peritoneal dialysis patient  . Dyspnea    doe  . Edema   . History of recent blood transfusion 01/2017  . Hypercholesterolemia   . Hypertension   . Hypothyroidism   . Neuropathy   . PONV (postoperative nausea and vomiting)   . Shingles    October-November 2017  . Steal syndrome as complication of dialysis access ([place])   . Tremors of nervous system    intermittent when taking gabapentin    Past Surgical History:  Procedure Laterality Date  . A/V FISTULAGRAM Left 01/16/2017   Procedure: A/V Fistulagram;  Surgeon: Katha Cabal, MD;  Location: [place] [facility];  Service: Cardiovascular;  Laterality: Left;  .  A/V SHUNT INTERVENTION N/A 01/16/2017   Procedure: A/V Shunt Intervention;  Surgeon: Katha Cabal, MD;  Location: [place] [facility];  Service: Cardiovascular;  Laterality: N/A;  . ABDOMINAL HYSTERECTOMY    . AV FISTULA PLACEMENT Left 11/30/2016   Procedure:  ARTERIOVENOUS (AV) FISTULA CREATION ( EXPLORE FOR CREATION BRACHIOCEPHALIC);  Surgeon: Algernon Huxley, MD;  Location: [facility];  Service: Vascular;  Laterality: Left;  . CAPD INSERTION N/A 11/25/2015   Procedure: LAPAROSCOPIC INSERTION CONTINUOUS AMBULATORY PERITONEAL DIALYSIS  (CAPD) CATHETER;  Surgeon: Algernon Huxley, MD;  Location: [facility];  Service: Vascular;  Laterality: N/A;  . CAPD INSERTION N/A 01/06/2016   Procedure: LAPAROSCOPIC INSERTION CONTINUOUS AMBULATORY PERITONEAL DIALYSIS  (CAPD) CATHETER REVISION ;  Surgeon: Algernon Huxley, MD;  Location: [facility];  Service: Vascular;  Laterality: N/A;  . CAPD INSERTION N/A 10/11/2016   Procedure: LAPAROSCOPIC INSERTION CONTINUOUS AMBULATORY PERITONEAL DIALYSIS  (CAPD) CATHETER ( REVISION );  Surgeon: Algernon Huxley, MD;  Location: [facility];  Service: Vascular;  Laterality: N/A;  . CARDIAC CATHETERIZATION    . CATARACT EXTRACTION W/PHACO Right 05/25/2016   Procedure: CATARACT EXTRACTION PHACO AND INTRAOCULAR LENS PLACEMENT (IOC);  Surgeon: Eulogio Bear, MD;  Location: [facility];  Service: Ophthalmology;  Laterality: Right;  Lot # [id number] H [place]: 00:5.2 AP%: 11.3 CDE: 7.21  . CATARACT EXTRACTION W/PHACO Left 12/06/2017   Procedure: CATARACT EXTRACTION PHACO AND INTRAOCULAR LENS PLACEMENT (IOC);  Surgeon: Eulogio Bear, MD;  Location: [facility];  Service: Ophthalmology;  Laterality: Left;  [place] 00.23.4 AP% 7.9 CDE 1.83 Fluid Pack Lot # [id number]  . COLONOSCOPY WITH PROPOFOL N/A 02/15/2017   Procedure: COLONOSCOPY WITH PROPOFOL;  Surgeon: [name], MD;  Location: [facility] END[facility]COPY;  Service: Gastroenterology;  Laterality: N/A;  . CORONARY ANGIOPLASTY     STENT  . CORONARY STENT INTERVENTION N/A 09/07/2016   Procedure: Coronary Stent Intervention;  Surgeon: Isaias Cowman, MD;  Location: [place] [facility];  Service: Cardiovascular;  Laterality: N/A;  . DIALYSIS/PERMA CATHETER INSERTION N/A 09/21/2016   Procedure: Dialysis/Perma Catheter Insertion;   Surgeon: Algernon Huxley, MD;  Location: [place] [facility];  Service: Cardiovascular;  Laterality: N/A;  . DIALYSIS/PERMA CATHETER REMOVAL Right 11/30/2016   Procedure: DIALYSIS/PERMA CATHETER REMOVAL;  Surgeon: Algernon Huxley, MD;  Location: [facility];  Service: Vascular;  Laterality: Right;  . ESOPHAGOGASTRODUODENOSCOPY (EGD) WITH PROPOFOL N/A 02/15/2017   Procedure: ESOPHAGOGASTRODUODENOSCOPY (EGD) WITH PROPOFOL;  Surgeon: [name], MD;  Location: [facility] ENDOSCOPY;  Service: Gastroenterology;  Laterality: N/A;  . EYE SURGERY    . GIVENS CAPSULE STUDY N/A 02/26/2017   Procedure: GIVENS CAPSULE STUDY;  Surgeon: [name], MD;  Location: [facility] ENDOSCOPY;  Service: Gastroenterology;  Laterality: N/A;  . INSERTION OF DIALYSIS CATHETER N/A 04/07/2018   Procedure: INSERTION OF DIALYSIS CATHETER;  Surgeon: Rosetta Posner, MD;  Location: [facility];  Service: Vascular;  Laterality: N/A;  . LEFT HEART CATH AND CORONARY ANGIOGRAPHY Left 09/07/2016   Procedure: Left Heart Cath and Coronary Angiography;  Surgeon: Isaias Cowman, MD;  Location: [place] [facility];  Service: Cardiovascular;  Laterality: Left;  . LIGATION OF ARTERIOVENOUS  FISTULA Left 02/07/2017   Procedure: LIGATION OF ARTERIOVENOUS  FISTULA ( BANDING BRACHIAL CEPHALIC );  Surgeon: Katha Cabal, MD;  Location: [facility];  Service: Vascular;  Laterality: Left;  . TUBAL LIGATION    . UPPER EXTREMITY ANGIOGRAPHY Left 01/19/2017   Procedure: Upper Extremity Angiography;  Surgeon: Katha Cabal, MD;  Location: [place]  [facility];  Service: Cardiovascular;  Laterality: Left;     reports that she quit smoking about 12 years ago. She has never used smokeless tobacco. She reports that she does not drink alcohol and does not use drugs.  Allergies  Allergen Reactions  . Penicillin G   . Valacyclovir Nausea Only    Family History  Problem Relation Age of Onset  . Diabetes Mother   . Breast cancer Neg Hx       Prior to Admission  medications   Medication Sig Start Date End Date Taking? Authorizing Provider  amLODipine (NORVASC) 5 MG tablet Take 5 mg by mouth daily. 03/29/19  Yes [provider]  aspirin EC 81 MG tablet Take 1 tablet (81 mg total) by mouth daily. Start taking after NOV 15 Patient taking differently: Take 81 mg by mouth daily. 05/26/15  Yes Murlean Iba, MD  calcitRIOL (ROCALTROL) 0.5 MCG capsule Take 0.5 mcg by mouth daily. 02/14/19  Yes [provider]  calcium acetate (PHOSLO) 667 MG capsule Take 2,001 mg by mouth 3 (three) times daily with meals.   Yes [provider]  carvedilol (COREG) 25 MG tablet Take 25 mg by mouth 2 (two) times daily with a meal. 01/13/19  Yes [provider]  furosemide (LASIX) 80 MG tablet Take 80 mg by mouth daily.   Yes [provider]  hydrALAZINE (APRESOLINE) 50 MG tablet Take 50 mg by mouth 2 (two) times daily.   Yes [provider]  insulin aspart (NOVOLOG) 100 UNIT/ML FlexPen Inject 4-8 Units into the skin 3 (three) times daily with meals. Sliding scale  Below 200 take 4 units 200-250=6 units  >250= 8 units 09/13/15  Yes [provider]  irbesartan (AVAPRO) 75 MG tablet Take 75 mg by mouth daily. 09/27/20  Yes [provider]  iron polysaccharides (NIFEREX) 150 MG capsule Take 150 mg by mouth daily.   Yes [provider]  levothyroxine (SYNTHROID, LEVOTHROID) 112 MCG tablet Take 112 mcg by mouth daily before breakfast.    Yes [provider]  lovastatin (MEVACOR) 40 MG tablet Take 40 mg by mouth at bedtime.   Yes [provider]  Multiple Vitamins-Minerals (DIALYVITE 800/ULTRA D) TABS Take 1 tablet by mouth daily.   Yes [provider]  oxyCODONE (OXY IR/ROXICODONE) 5 MG immediate release tablet Take 5 mg by mouth every 6 (six) hours as needed. 08/26/19  Yes [provider]  pantoprazole (PROTONIX) 40 MG tablet Take 1 tablet (40 mg total) by mouth 2 (two) times  daily. Patient taking differently: Take 40 mg by mouth daily. 01/31/17  Yes Max Sane, MD  sevelamer carbonate (RENVELA) 800 MG tablet Take 1,600 mg by mouth 3 (three) times daily with meals.   Yes [provider]  TOUJEO SOLOSTAR 300 UNIT/ML SOPN Inject 24 Units into the skin Nightly. 02/11/19  Yes [provider]  Lidocaine, Anorectal, 5 % CREA Apply 1 application topically as needed. 06/03/18   [provider]  lisinopril (ZESTRIL) 20 MG tablet Take 20 mg by mouth daily. Patient not taking: No sig reported 02/13/19   [provider]  urea (CARMOL) 20 % cream Apply 1 application topically daily as needed (for dry skin).    [provider]    Physical Exam: Vitals:   12/06/20 0933 12/06/20 1000 12/06/20 1030 12/06/20 1117  BP: (!) 246/76 (!) 243/72 (!) 219/69 (!) 189/89  Pulse: 70 65 65 67  Resp: 14 (!) 22 20  Temp:      TempSrc:      SpO2: 96% 94% 95% 94%  Weight:      Height:         Vitals:   12/06/20 0933 12/06/20 1000 12/06/20 1030 12/06/20 1117  BP: (!) 246/76 (!) 243/72 (!) 219/69 (!) 189/89  Pulse: 70 65 65 67  Resp: 14 (!) 22 20   Temp:      TempSrc:      SpO2: 96% 94% 95% 94%  Weight:      Height:          Constitutional: Alert and oriented x 3 . Not in any apparent distress HEENT:      Head: Normocephalic and atraumatic.         Eyes: PERLA, EOMI, Conjunctivae are normal. Sclera is non-icteric.       Mouth/Throat: Mucous membranes are moist.       Neck: Supple with no signs of meningismus. Cardiovascular: Regular rate and rhythm. No murmurs, gallops, or rubs. 2+ symmetrical distal pulses are present . No JVD. No LE edema Respiratory: Respiratory effort normal. crackles at the bases bilaterally.  No wheezes or rhonchi.  Gastrointestinal: Soft, epigastric tenderness, and non distended with positive bowel sounds.  Genitourinary: No CVA tenderness. Musculoskeletal: Nontender with normal range of motion in all  extremities. No cyanosis, or erythema of extremities. Neurologic:  Face is symmetric. Moving all extremities. No gross focal neurologic deficits  Skin: Skin is warm, dry.  No rash or ulcers Psychiatric: Mood and affect are normal    Labs on Admission: I have personally reviewed following labs and imaging studies  CBC: Recent Labs  Lab 12/06/20 0943  WBC 6.1  NEUTROABS 4.6  HGB 13.9  HCT 40.5  MCV 94.4  PLT 97*   Basic Metabolic Panel: Recent Labs  Lab 12/06/20 0943  NA 136  K 6.5*  CL 97*  CO2 27  GLUCOSE 132*  BUN 47*  CREATININE 9.55*  CALCIUM 10.8*   GFR: Estimated Creatinine Clearance: 4.4 mL/min (A) (by C-G formula based on SCr of 9.55 mg/dL (H)). Liver Function Tests: Recent Labs  Lab 12/06/20 0943  AST 18  ALT 16  ALKPHOS 58  BILITOT 0.7  PROT 6.1*  ALBUMIN 3.4*   Recent Labs  Lab 12/06/20 0943  LIPASE 39   No results for input(s): AMMONIA in the last 168 hours. Coagulation Profile: No results for input(s): INR, PROTIME in the last 168 hours. Cardiac Enzymes: No results for input(s): CKTOTAL, CKMB, CKMBINDEX, TROPONINI in the last 168 hours. BNP (last 3 results) No results for input(s): PROBNP in the last 8760 hours. HbA1C: No results for input(s): HGBA1C in the last 72 hours. CBG: No results for input(s): GLUCAP in the last 168 hours. Lipid Profile: No results for input(s): CHOL, HDL, LDLCALC, TRIG, CHOLHDL, LDLDIRECT in the last 72 hours. Thyroid Function Tests: No results for input(s): TSH, T4TOTAL, FREET4, T3FREE, THYROIDAB in the last 72 hours. Anemia Panel: No results for input(s): VITAMINB12, FOLATE, FERRITIN, TIBC, IRON, RETICCTPCT in the last 72 hours. Urine analysis:    Component Value Date/Time   COLORURINE YELLOW (A) 04/06/2018 0700   APPEARANCEUR TURBID (A) 04/06/2018 0700   LABSPEC 1.010 04/06/2018 0700   PHURINE 5.0 04/06/2018 0700   GLUCOSEU 50 (A) 04/06/2018 0700   HGBUR MODERATE (A) 04/06/2018 0700   BILIRUBINUR  NEGATIVE 04/06/2018 0700   KETONESUR NEGATIVE 04/06/2018 0700   PROTEINUR 100 (A) 04/06/2018 0700   NITRITE NEGATIVE 04/06/2018 0700  LEUKOCYTESUR LARGE (A) 04/06/2018 0700    Radiological Exams on Admission: CT ABDOMEN PELVIS WO CONTRAST  Result Date: 12/06/2020 CLINICAL DATA:  Nausea, shortness of breath EXAM: CT ABDOMEN AND PELVIS WITHOUT CONTRAST TECHNIQUE: Multidetector CT imaging of the abdomen and pelvis was performed following the standard protocol without IV contrast. COMPARISON:  05/04/2016 FINDINGS: Lower chest: Cardiomegaly. Densely calcified visualized right coronary artery and aorta. No acute abnormality. Hepatobiliary: No focal hepatic abnormality. Gallbladder unremarkable. Pancreas: No focal abnormality or ductal dilatation. Spleen: No focal abnormality.  Normal size. Adrenals/Urinary Tract: 3 mm stone layering in the urinary bladder. Bilateral nonobstructing renal stones. No hydronephrosis. Bilateral perinephric stranding is similar to prior study. Adrenal glands unremarkable. Stomach/Bowel: Normal appendix. Stomach, large and small bowel grossly unremarkable. Vascular/Lymphatic: Heavily calcified aorta and iliac vessels. No evidence of aneurysm or adenopathy. Reproductive: Prior hysterectomy.  No adnexal masses. Other: Trace free fluid in the pelvis.  No free air. Musculoskeletal: No acute bony abnormality. IMPRESSION: 3 mm bladder stone.  No ureteral stones or hydronephrosis. Bilateral nephrolithiasis, nonobstructing. Cardiomegaly, coronary artery disease. Aortic atherosclerosis. Trace free fluid in the pelvis. Electronically Signed   By: Rolm Baptise M.D.   On: 12/06/2020 09:42   DG Chest 2 View  Result Date: 12/06/2020 CLINICAL DATA:  Shortness of breath EXAM: CHEST - 2 VIEW COMPARISON:  04/10/2019 FINDINGS: Cardiomegaly, vascular congestion. Diffuse interstitial prominence is similar to prior study could reflect chronic lung disease or interstitial edema. No confluent opacities  or effusions. Aortic atherosclerosis. No acute bony abnormality. IMPRESSION: Cardiomegaly, vascular congestion. Interstitial prominence could reflect chronic lung disease or interstitial edema. Electronically Signed   By: Rolm Baptise M.D.   On: 12/06/2020 09:20   CT Head Wo Contrast  Result Date: 12/06/2020 CLINICAL DATA:  Headache EXAM: CT HEAD WITHOUT CONTRAST TECHNIQUE: Contiguous axial images were obtained from the base of the skull through the vertex without intravenous contrast. COMPARISON:  04/10/2019 FINDINGS: Brain: No acute intracranial abnormality. Specifically, no hemorrhage, hydrocephalus, mass lesion, acute infarction, or significant intracranial injury. Vascular: No hyperdense vessel or unexpected calcification. Skull: No acute calvarial abnormality. Sinuses/Orbits: No acute findings Other: None IMPRESSION: No acute intracranial abnormality. Electronically Signed   By: Rolm Baptise M.D.   On: 12/06/2020 09:36   US ABDOMEN LIMITED RUQ (LIVER/GB)  Result Date: 12/06/2020 CLINICAL DATA:  70 year old female with right upper quadrant pain EXAM: ULTRASOUND ABDOMEN LIMITED RIGHT UPPER QUADRANT COMPARISON:  CT abdomen pelvis from 12/06/2020 FINDINGS: Gallbladder: No gallstones, pericholecystic fluid, or gallbladder wall thickening visualized. No sonographic Murphy sign noted by sonographer. Common bile duct: Diameter: 3.5 mm Liver: No focal lesion identified. Within normal limits in parenchymal echogenicity and echotexture. Portal vein is patent on color Doppler imaging with normal direction of blood flow towards the liver. Other: No perihepatic ascites. IMPRESSION: No acute sonographic abnormality in the right upper quadrant. Electronically Signed   By: Ruthann Cancer MD   On: 12/06/2020 10:01     Assessment/Plan Principal Problem:   Hyperkalemia Active Problems:   ESRD on dialysis ([place])   Diabetes mellitus ([place])   CAD (coronary artery disease)   Hypothyroidism   Hypertensive urgency      Hyperkalemia In a patient with a history of end-stage renal disease She has no EKG changes Patient received sodium bicarbonate, calcium chloride, insulin and dextrose Expect improvement in potassium levels following renal replacement therapy    Diabetes mellitus with complications of end-stage renal disease Patient's dialysis days are M/W/F We will consult nephrology for renal replacement therapy  Maintain consistent carbohydrate diet Glycemic control with sliding scale insulin.    History of coronary artery disease Continue aspirin, statins and beta-blockers      Hypertensive urgency Patient is on multiple antihypertensive medications which will be resumed and include amlodipine, carvedilol and hydralazine Avapro is on hold due to hyperkalemia.    History of chronic combined systolic and diastolic dysfunction CHF Continue furosemide and Carvedilol Avapro on hold due to hyperkalemia    Hypothyroidism Continue Synthroid  DVT prophylaxis: Heparin Code Status: full code Family Communication: Greater than 50% of time was spent discussing patient's condition and plan of care with her and her husband at the bedside. All questions and concerns have been addressed.  They verbalized understanding and agree with the plan. Disposition Plan: Back to previous home environment Consults called: Nephrology Status: Observation    Reality Dejonge MD [facility]     12/06/2020, 12:04 PM

## 2020-12-07 DIAGNOSIS — D631 Anemia in chronic kidney disease: Secondary | ICD-10-CM | POA: Diagnosis not present

## 2020-12-07 DIAGNOSIS — J810 Acute pulmonary edema: Secondary | ICD-10-CM | POA: Diagnosis not present

## 2020-12-07 DIAGNOSIS — E875 Hyperkalemia: Secondary | ICD-10-CM | POA: Diagnosis not present

## 2020-12-07 DIAGNOSIS — I10 Essential (primary) hypertension: Secondary | ICD-10-CM | POA: Diagnosis not present

## 2020-12-07 LAB — CBC
HCT: 35.1 % — ABNORMAL LOW (ref 36.0–46.0)
Hemoglobin: 11.9 g/dL — ABNORMAL LOW (ref 12.0–15.0)
MCH: 31.8 pg (ref 26.0–34.0)
MCHC: 33.9 g/dL (ref 30.0–36.0)
MCV: 93.9 fL (ref 80.0–100.0)
Platelets: 87 10*3/uL — ABNORMAL LOW (ref 150–400)
RBC: 3.74 MIL/uL — ABNORMAL LOW (ref 3.87–5.11)
RDW: 13.1 % (ref 11.5–15.5)
WBC: 6.2 10*3/uL (ref 4.0–10.5)
nRBC: 0 % (ref 0.0–0.2)

## 2020-12-07 LAB — BASIC METABOLIC PANEL
Anion gap: 9 (ref 5–15)
BUN: 23 mg/dL (ref 8–23)
CO2: 29 mmol/L (ref 22–32)
Calcium: 8.9 mg/dL (ref 8.9–10.3)
Chloride: 97 mmol/L — ABNORMAL LOW (ref 98–111)
Creatinine, Ser: 5.87 mg/dL — ABNORMAL HIGH (ref 0.44–1.00)
GFR, Estimated: 7 mL/min — ABNORMAL LOW (ref >60)
Glucose, Bld: 89 mg/dL (ref 70–99)
Potassium: 4.4 mmol/L (ref 3.5–5.1)
Sodium: 135 mmol/L (ref 135–145)

## 2020-12-07 LAB — GLUCOSE, CAPILLARY
Glucose-Capillary: 102 mg/dL — ABNORMAL HIGH (ref 70–99)
Glucose-Capillary: 110 mg/dL — ABNORMAL HIGH (ref 70–99)

## 2020-12-07 MED ORDER — CARVEDILOL 25 MG PO TABS
25.0000 mg | ORAL_TABLET | Freq: Two times a day (BID) | ORAL | Status: DC
  Administered 2020-12-07 (×2): 25 mg via ORAL
  Filled 2020-12-07 (×2): qty 1

## 2020-12-07 MED ORDER — HYDRALAZINE HCL 100 MG PO TABS: 100.0000 mg | ORAL_TABLET | Freq: Two times a day (BID) | ORAL | 1 refills | Status: AC

## 2020-12-07 MED ORDER — IRBESARTAN 150 MG PO TABS: 150.0000 mg | ORAL_TABLET | Freq: Every day | ORAL | 1 refills | Status: AC

## 2020-12-07 MED ORDER — IRBESARTAN 150 MG PO TABS
75.0000 mg | ORAL_TABLET | ORAL | Status: AC
  Administered 2020-12-07: 75 mg via ORAL
  Filled 2020-12-07: qty 1

## 2020-12-07 MED ORDER — AMLODIPINE BESYLATE 10 MG PO TABS
10.0000 mg | ORAL_TABLET | Freq: Every day | ORAL | Status: DC
  Administered 2020-12-07: 10 mg via ORAL
  Filled 2020-12-07 (×2): qty 1

## 2020-12-07 MED ORDER — LABETALOL HCL 5 MG/ML IV SOLN
10.0000 mg | Freq: Once | INTRAVENOUS | Status: AC
  Administered 2020-12-07: 10 mg via INTRAVENOUS
  Filled 2020-12-07: qty 4

## 2020-12-07 MED ORDER — AMLODIPINE BESYLATE 10 MG PO TABS: 10.0000 mg | ORAL_TABLET | Freq: Every day | ORAL | 1 refills | Status: AC

## 2020-12-07 MED ORDER — IRBESARTAN 150 MG PO TABS: 150.0000 mg | ORAL_TABLET | Freq: Every day | ORAL | Status: DC

## 2020-12-07 MED ORDER — HYDRALAZINE HCL 50 MG PO TABS
100.0000 mg | ORAL_TABLET | Freq: Two times a day (BID) | ORAL | Status: DC
  Administered 2020-12-07 (×2): 100 mg via ORAL
  Filled 2020-12-07 (×2): qty 2

## 2020-12-07 MED ORDER — IRBESARTAN 150 MG PO TABS
75.0000 mg | ORAL_TABLET | Freq: Every day | ORAL | Status: DC
  Administered 2020-12-07: 75 mg via ORAL
  Filled 2020-12-07: qty 1

## 2020-12-07 NOTE — Discharge Summary (Signed)
Physician Discharge Summary  Bethany Lee XVQ:[id number] DOB: 01/09/1951 DOA: 12/06/2020  PCP: Tracie Harrier, MD  Admit date: 12/06/2020 Discharge date: 12/07/2020  Admitted From: Home Disposition: Home  Recommendations for Outpatient Follow-up:  1. Follow up with PCP in 1-2 weeks 2. Please obtain BMP/CBC in one week 3. Please follow up on the following pending results: None  Home Health: No Equipment/Devices: None Discharge Condition: Stable CODE STATUS: Full Diet recommendation: Heart Healthy   Brief/Interim Summary: Bethany Lee is a 70 y.o. female with medical history significant for diabetes mellitus with complications of end-stage renal disease on hemodialysis (M/W/F), coronary artery disease, hypertension, hypothyroidism and dyslipidemia who presents to the ER for evaluation of shortness of breath and nausea.Her last dialysis was on Friday and she denies having any dietary indiscretion. Found to have hyperkalemia with potassium of 6.5 requiring management with sodium bicarbonate, calcium chloride, insulin and dextrose along with dialysis.  Potassium normalized before discharge. Patient appears at her baseline and was discharged home to continue her scheduled dialysis.  Patient was also found to have markedly elevated blood pressure.  On multiple medications at home.  Her home dose of amlodipine, hydralazine and irbesartan was increased and she will follow-up with her primary care provider and nephrologist closely for better control of hypertension.  Patient will continue with rest of her home medications and follow-up with her providers.  Discharge Diagnoses:  Principal Problem:   Hyperkalemia Active Problems:   ESRD on dialysis ([place])   Diabetes mellitus ([place])   CAD (coronary artery disease)   Hypothyroidism   Hypertensive urgency   Discharge Instructions  Discharge Instructions    Diet - low sodium heart healthy   Complete by: As directed     Discharge instructions   Complete by: As directed    It was pleasure taking care of you. We increased the doses of your blood pressure medications due to persistently elevated blood pressure, you will take amlodipine 10 mg instead of 5, irbesartan 150 mg instead of 75 and hydralazine 100 mg twice daily instead of 50.  You can take 2 pills each to finish your current prescription and then start taking your new prescription. Check your blood pressure regularly and keep a log. Follow-up closely with your nephrologist and primary care provider for further management and better control of your blood pressure.   Increase activity slowly   Complete by: As directed    No wound care   Complete by: As directed      Allergies as of 12/07/2020      Reactions   Penicillin G    Valacyclovir Nausea Only      Medication List    STOP taking these medications   lisinopril 20 MG tablet Commonly known as: ZESTRIL     TAKE these medications   amLODipine 10 MG tablet Commonly known as: NORVASC Take 1 tablet (10 mg total) by mouth daily. Start taking on: Dec 08, 2020 What changed:   medication strength  how much to take   aspirin EC 81 MG tablet Take 1 tablet (81 mg total) by mouth daily. Start taking after NOV 15 What changed: additional instructions   calcitRIOL 0.5 MCG capsule Commonly known as: ROCALTROL Take 0.5 mcg by mouth daily.   calcium acetate 667 MG capsule Commonly known as: PHOSLO Take 2,001 mg by mouth 3 (three) times daily with meals.   carvedilol 25 MG tablet Commonly known as: COREG Take 25 mg by mouth 2 (two) times  daily with a meal.   Dialyvite 800/Ultra D Tabs Take 1 tablet by mouth daily.   furosemide 80 MG tablet Commonly known as: LASIX Take 80 mg by mouth daily.   hydrALAZINE 100 MG tablet Commonly known as: APRESOLINE Take 1 tablet (100 mg total) by mouth 2 (two) times daily. What changed:   medication strength  how much to take   insulin aspart  100 UNIT/ML FlexPen Commonly known as: NOVOLOG Inject 4-8 Units into the skin 3 (three) times daily with meals. Sliding scale  Below 200 take 4 units 200-250=6 units  >250= 8 units   irbesartan 150 MG tablet Commonly known as: AVAPRO Take 1 tablet (150 mg total) by mouth daily. Start taking on: Dec 08, 2020 What changed:   medication strength  how much to take   iron polysaccharides 150 MG capsule Commonly known as: NIFEREX Take 150 mg by mouth daily.   levothyroxine 112 MCG tablet Commonly known as: SYNTHROID Take 112 mcg by mouth daily before breakfast.   Lidocaine (Anorectal) 5 % Crea Apply 1 application topically as needed.   lovastatin 40 MG tablet Commonly known as: MEVACOR Take 40 mg by mouth at bedtime.   oxyCODONE 5 MG immediate release tablet Commonly known as: Oxy IR/ROXICODONE Take 5 mg by mouth every 6 (six) hours as needed.   pantoprazole 40 MG tablet Commonly known as: Protonix Take 1 tablet (40 mg total) by mouth 2 (two) times daily. What changed: when to take this   sevelamer carbonate 800 MG tablet Commonly known as: RENVELA Take 1,600 mg by mouth 3 (three) times daily with meals.   Toujeo SoloStar 300 UNIT/ML Solostar Pen Generic drug: insulin glargine (1 Unit Dial) Inject 24 Units into the skin Nightly.   urea 20 % cream Commonly known as: CARMOL Apply 1 application topically daily as needed (for dry skin).       Follow-up Information    Tracie Harrier, MD. Schedule an appointment as soon as possible for a visit.   Specialty: Internal Medicine Contact information: [place] [address] [phone]              Allergies  Allergen Reactions  . Penicillin G   . Valacyclovir Nausea Only    Consultations:  Nephrology  Procedures/Studies: CT ABDOMEN PELVIS WO CONTRAST  Result Date: 12/06/2020 CLINICAL DATA:  Nausea, shortness of breath EXAM: CT ABDOMEN AND PELVIS WITHOUT  CONTRAST TECHNIQUE: Multidetector CT imaging of the abdomen and pelvis was performed following the standard protocol without IV contrast. COMPARISON:  05/04/2016 FINDINGS: Lower chest: Cardiomegaly. Densely calcified visualized right coronary artery and aorta. No acute abnormality. Hepatobiliary: No focal hepatic abnormality. Gallbladder unremarkable. Pancreas: No focal abnormality or ductal dilatation. Spleen: No focal abnormality.  Normal size. Adrenals/Urinary Tract: 3 mm stone layering in the urinary bladder. Bilateral nonobstructing renal stones. No hydronephrosis. Bilateral perinephric stranding is similar to prior study. Adrenal glands unremarkable. Stomach/Bowel: Normal appendix. Stomach, large and small bowel grossly unremarkable. Vascular/Lymphatic: Heavily calcified aorta and iliac vessels. No evidence of aneurysm or adenopathy. Reproductive: Prior hysterectomy.  No adnexal masses. Other: Trace free fluid in the pelvis.  No free air. Musculoskeletal: No acute bony abnormality. IMPRESSION: 3 mm bladder stone.  No ureteral stones or hydronephrosis. Bilateral nephrolithiasis, nonobstructing. Cardiomegaly, coronary artery disease. Aortic atherosclerosis. Trace free fluid in the pelvis. Electronically Signed   By: Rolm Baptise M.D.   On: 12/06/2020 09:42   DG Chest 2 View  Result Date:  12/06/2020 CLINICAL DATA:  Shortness of breath EXAM: CHEST - 2 VIEW COMPARISON:  04/10/2019 FINDINGS: Cardiomegaly, vascular congestion. Diffuse interstitial prominence is similar to prior study could reflect chronic lung disease or interstitial edema. No confluent opacities or effusions. Aortic atherosclerosis. No acute bony abnormality. IMPRESSION: Cardiomegaly, vascular congestion. Interstitial prominence could reflect chronic lung disease or interstitial edema. Electronically Signed   By: Rolm Baptise M.D.   On: 12/06/2020 09:20   CT Head Wo Contrast  Result Date: 12/06/2020 CLINICAL DATA:  Headache EXAM: CT HEAD  WITHOUT CONTRAST TECHNIQUE: Contiguous axial images were obtained from the base of the skull through the vertex without intravenous contrast. COMPARISON:  04/10/2019 FINDINGS: Brain: No acute intracranial abnormality. Specifically, no hemorrhage, hydrocephalus, mass lesion, acute infarction, or significant intracranial injury. Vascular: No hyperdense vessel or unexpected calcification. Skull: No acute calvarial abnormality. Sinuses/Orbits: No acute findings Other: None IMPRESSION: No acute intracranial abnormality. Electronically Signed   By: Rolm Baptise M.D.   On: 12/06/2020 09:36   US ABDOMEN LIMITED RUQ (LIVER/GB)  Result Date: 12/06/2020 CLINICAL DATA:  70 year old female with right upper quadrant pain EXAM: ULTRASOUND ABDOMEN LIMITED RIGHT UPPER QUADRANT COMPARISON:  CT abdomen pelvis from 12/06/2020 FINDINGS: Gallbladder: No gallstones, pericholecystic fluid, or gallbladder wall thickening visualized. No sonographic Murphy sign noted by sonographer. Common bile duct: Diameter: 3.5 mm Liver: No focal lesion identified. Within normal limits in parenchymal echogenicity and echotexture. Portal vein is patent on color Doppler imaging with normal direction of blood flow towards the liver. Other: No perihepatic ascites. IMPRESSION: No acute sonographic abnormality in the right upper quadrant. Electronically Signed   By: Ruthann Cancer MD   On: 12/06/2020 10:01     Subjective: Patient was seen and examined today.  No new complaints.  Husband at bedside.  Wants to go home.  Discharge Exam: Vitals:   12/07/20 0737 12/07/20 0739  BP: (!) 190/53 (!) 194/54  Pulse: 64 63  Resp: 16 16  Temp: 97.8 F (36.6 C)   SpO2: 98% 98%   Vitals:   12/07/20 0344 12/07/20 0500 12/07/20 0737 12/07/20 0739  BP: (!) 180/70 (!) 192/60 (!) 190/53 (!) 194/54  Pulse:  65 64 63  Resp:  18 16 16   Temp:  97.8 F (36.6 C) 97.8 F (36.6 C)   TempSrc:  Oral    SpO2:  97% 98% 98%  Weight:      Height:        General:  Pt is alert, awake, not in acute distress Cardiovascular: RRR, S1/S2 +, no rubs, no gallops Respiratory: CTA bilaterally, no wheezing, no rhonchi Abdominal: Soft, NT, ND, bowel sounds + Extremities: no edema, no cyanosis   The results of significant diagnostics from this hospitalization (including imaging, microbiology, ancillary and laboratory) are listed below for reference.    Microbiology: Recent Results (from the past 240 hour(s))  Resp Panel by RT-PCR (Flu A&B, Covid) Nasopharyngeal Swab     Status: None   Collection Time: 12/06/20 10:36 AM   Specimen: Nasopharyngeal Swab; Nasopharyngeal(NP) swabs in vial transport medium  Result Value Ref Range Status   SARS Coronavirus 2 by RT PCR NEGATIVE NEGATIVE Final    Comment: (NOTE) SARS-CoV-2 target nucleic acids are NOT DETECTED.  The SARS-CoV-2 RNA is generally detectable in upper respiratory specimens during the acute phase of infection. The lowest concentration of SARS-CoV-2 viral copies this assay can detect is 138 copies/mL. A negative result does not preclude SARS-Cov-2 infection and should not be used as the sole basis  for treatment or other patient management decisions. A negative result may occur with  improper specimen collection/handling, submission of specimen other than nasopharyngeal swab, presence of viral mutation(s) within the areas targeted by this assay, and inadequate number of viral copies(<138 copies/mL). A negative result must be combined with clinical observations, patient history, and epidemiological information. The expected result is Negative.  Fact Sheet for Patients:  EntrepreneurPulse.com.au  Fact Sheet for Healthcare Providers:  IncredibleEmployment.be  This test is no t yet approved or cleared by the [place] FDA and  has been authorized for detection and/or diagnosis of SARS-CoV-2 by FDA under an Emergency Use Authorization (EUA). This EUA will remain   in effect (meaning this test can be used) for the duration of the COVID-19 declaration under Section 564(b)(1) of the Act, 21 U.S.C.section 360bbb-3(b)(1), unless the authorization is terminated  or revoked sooner.       Influenza A by PCR NEGATIVE NEGATIVE Final   Influenza B by PCR NEGATIVE NEGATIVE Final    Comment: (NOTE) The Xpert Xpress SARS-CoV-2/FLU/RSV plus assay is intended as an aid in the diagnosis of influenza from Nasopharyngeal swab specimens and should not be used as a sole basis for treatment. Nasal washings and aspirates are unacceptable for Xpert Xpress SARS-CoV-2/FLU/RSV testing.  Fact Sheet for Patients: EntrepreneurPulse.com.au  Fact Sheet for Healthcare Providers: IncredibleEmployment.be  This test is not yet approved or cleared by the [place] FDA and has been authorized for detection and/or diagnosis of SARS-CoV-2 by FDA under an Emergency Use Authorization (EUA). This EUA will remain in effect (meaning this test can be used) for the duration of the COVID-19 declaration under Section 564(b)(1) of the Act, 21 U.S.C. section 360bbb-3(b)(1), unless the authorization is terminated or revoked.  Performed at [facility], [address] [place], [place] [address]      Labs: BNP (last 3 results) No results for input(s): BNP in the last 8760 hours. Basic Metabolic Panel: Recent Labs  Lab 12/06/20 0943 12/07/20 0412  NA 136 135  K 6.5* 4.4  CL 97* 97*  CO2 27 29  GLUCOSE 132* 89  BUN 47* 23  CREATININE 9.55* 5.87*  CALCIUM 10.8* 8.9   Liver Function Tests: Recent Labs  Lab 12/06/20 0943  AST 18  ALT 16  ALKPHOS 58  BILITOT 0.7  PROT 6.1*  ALBUMIN 3.4*   Recent Labs  Lab 12/06/20 0943  LIPASE 39   No results for input(s): AMMONIA in the last 168 hours. CBC: Recent Labs  Lab 12/06/20 0943 12/07/20 0412  WBC 6.1 6.2  NEUTROABS 4.6  --   HGB 13.9 11.9*  HCT 40.5 35.1*  MCV  94.4 93.9  PLT 97* 87*   Cardiac Enzymes: No results for input(s): CKTOTAL, CKMB, CKMBINDEX, TROPONINI in the last 168 hours. BNP: Invalid input(s): POCBNP CBG: Recent Labs  Lab 12/06/20 1738 12/06/20 1851 12/06/20 2012 12/07/20 0736  GLUCAP 87 132* 120* 102*   D-Dimer No results for input(s): DDIMER in the last 72 hours. Hgb A1c Recent Labs    12/06/20 0943  HGBA1C 5.7*   Lipid Profile No results for input(s): CHOL, HDL, LDLCALC, TRIG, CHOLHDL, LDLDIRECT in the last 72 hours. Thyroid function studies No results for input(s): TSH, T4TOTAL, T3FREE, THYROIDAB in the last 72 hours.  Invalid input(s): FREET3 Anemia work up No results for input(s): VITAMINB12, FOLATE, FERRITIN, TIBC, IRON, RETICCTPCT in the last 72 hours. Urinalysis    Component Value Date/Time   COLORURINE YELLOW (A) 04/06/2018 0700  APPEARANCEUR TURBID (A) 04/06/2018 0700   LABSPEC 1.010 04/06/2018 0700   PHURINE 5.0 04/06/2018 0700   GLUCOSEU 50 (A) 04/06/2018 0700   HGBUR MODERATE (A) 04/06/2018 0700   BILIRUBINUR NEGATIVE 04/06/2018 0700   KETONESUR NEGATIVE 04/06/2018 0700   PROTEINUR 100 (A) 04/06/2018 0700   NITRITE NEGATIVE 04/06/2018 0700   LEUKOCYTESUR LARGE (A) 04/06/2018 0700   Sepsis Labs Invalid input(s): PROCALCITONIN,  WBC,  LACTICIDVEN Microbiology Recent Results (from the past 240 hour(s))  Resp Panel by RT-PCR (Flu A&B, Covid) Nasopharyngeal Swab     Status: None   Collection Time: 12/06/20 10:36 AM   Specimen: Nasopharyngeal Swab; Nasopharyngeal(NP) swabs in vial transport medium  Result Value Ref Range Status   SARS Coronavirus 2 by RT PCR NEGATIVE NEGATIVE Final    Comment: (NOTE) SARS-CoV-2 target nucleic acids are NOT DETECTED.  The SARS-CoV-2 RNA is generally detectable in upper respiratory specimens during the acute phase of infection. The lowest concentration of SARS-CoV-2 viral copies this assay can detect is 138 copies/mL. A negative result does not preclude  SARS-Cov-2 infection and should not be used as the sole basis for treatment or other patient management decisions. A negative result may occur with  improper specimen collection/handling, submission of specimen other than nasopharyngeal swab, presence of viral mutation(s) within the areas targeted by this assay, and inadequate number of viral copies(<138 copies/mL). A negative result must be combined with clinical observations, patient history, and epidemiological information. The expected result is Negative.  Fact Sheet for Patients:  EntrepreneurPulse.com.au  Fact Sheet for Healthcare Providers:  IncredibleEmployment.be  This test is no t yet approved or cleared by the [place] FDA and  has been authorized for detection and/or diagnosis of SARS-CoV-2 by FDA under an Emergency Use Authorization (EUA). This EUA will remain  in effect (meaning this test can be used) for the duration of the COVID-19 declaration under Section 564(b)(1) of the Act, 21 U.S.C.section 360bbb-3(b)(1), unless the authorization is terminated  or revoked sooner.       Influenza A by PCR NEGATIVE NEGATIVE Final   Influenza B by PCR NEGATIVE NEGATIVE Final    Comment: (NOTE) The Xpert Xpress SARS-CoV-2/FLU/RSV plus assay is intended as an aid in the diagnosis of influenza from Nasopharyngeal swab specimens and should not be used as a sole basis for treatment. Nasal washings and aspirates are unacceptable for Xpert Xpress SARS-CoV-2/FLU/RSV testing.  Fact Sheet for Patients: EntrepreneurPulse.com.au  Fact Sheet for Healthcare Providers: IncredibleEmployment.be  This test is not yet approved or cleared by the [place] FDA and has been authorized for detection and/or diagnosis of SARS-CoV-2 by FDA under an Emergency Use Authorization (EUA). This EUA will remain in effect (meaning this test can be used) for the duration of  the COVID-19 declaration under Section 564(b)(1) of the Act, 21 U.S.C. section 360bbb-3(b)(1), unless the authorization is terminated or revoked.  Performed at [facility], [address] [place], [place] [address]     Time coordinating discharge: Over 30 minutes  SIGNED:  Lorella Nimrod, MD  [facility] 12/07/2020, 10:40 AM  If 7PM-7AM, please contact night-coverage [email or web address]  This record has been created using Systems analyst. Errors have been sought and corrected,but may not always be located. Such creation errors do not reflect on the standard of care.

## 2020-12-07 NOTE — Progress Notes (Signed)
Patient and husband expressed concerns related to night shift care and nursing staff. Escalated concerns to charge RN Linard Millers, and informed patient and husband I will notify Director upon her arrival. Explained medications given overnight and this morning. Explained manual BP were taken because of high SBP to ensure accurate readings.   Husband became increasingly agitated, stating night nurse was a "dumb b-hole." Charge RN Stephanie's presence requested, who asked patient and husband to express their concerns. Patient and husband expressed concerns regarding night shift staff and care, dialysis nursing care, transportation concerns, and overall dissatisfaction with stay at [facility]. AD Franki Cabot and director Evette Law notified.

## 2020-12-07 NOTE — Progress Notes (Signed)
Late entry Notified by RN that patient was agitated and confused after dialysis. Patient seen and examined at bedside and was admitted only to person not to place or time.  She was awake, alert and oriented to person place and time of admission.  Able to move all extremities. Patient has not had any oral intake since the morning of her admission and received insulin and dextrose for hyperkalemia. Blood sugar check at bedside was 83.  Advised RN to give some orange juice, feed patient and recheck blood sugar. Repeat blood sugar was 132 at 6:52 PM and per RN, patient was back to her baseline mental status.

## 2020-12-08 DIAGNOSIS — Z992 Dependence on renal dialysis: Secondary | ICD-10-CM | POA: Diagnosis not present

## 2020-12-08 DIAGNOSIS — N186 End stage renal disease: Secondary | ICD-10-CM | POA: Diagnosis not present

## 2020-12-08 LAB — HEPATITIS B SURFACE ANTIBODY, QUANTITATIVE: Hep B S AB Quant (Post): 430.5 m[IU]/mL (ref >9.9)

## 2020-12-10 DIAGNOSIS — Z992 Dependence on renal dialysis: Secondary | ICD-10-CM | POA: Diagnosis not present

## 2020-12-10 DIAGNOSIS — N186 End stage renal disease: Secondary | ICD-10-CM | POA: Diagnosis not present

## 2020-12-13 DIAGNOSIS — N186 End stage renal disease: Secondary | ICD-10-CM | POA: Diagnosis not present

## 2020-12-13 DIAGNOSIS — Z992 Dependence on renal dialysis: Secondary | ICD-10-CM | POA: Diagnosis not present

## 2020-12-14 DIAGNOSIS — I5022 Chronic systolic (congestive) heart failure: Secondary | ICD-10-CM | POA: Diagnosis not present

## 2020-12-14 DIAGNOSIS — N186 End stage renal disease: Secondary | ICD-10-CM | POA: Diagnosis not present

## 2020-12-14 DIAGNOSIS — Z992 Dependence on renal dialysis: Secondary | ICD-10-CM | POA: Diagnosis not present

## 2020-12-14 DIAGNOSIS — Z09 Encounter for follow-up examination after completed treatment for conditions other than malignant neoplasm: Secondary | ICD-10-CM | POA: Diagnosis not present

## 2020-12-14 DIAGNOSIS — E1142 Type 2 diabetes mellitus with diabetic polyneuropathy: Secondary | ICD-10-CM | POA: Diagnosis not present

## 2020-12-14 DIAGNOSIS — I132 Hypertensive heart and chronic kidney disease with heart failure and with stage 5 chronic kidney disease, or end stage renal disease: Secondary | ICD-10-CM | POA: Diagnosis not present

## 2020-12-14 DIAGNOSIS — I2510 Atherosclerotic heart disease of native coronary artery without angina pectoris: Secondary | ICD-10-CM | POA: Diagnosis not present

## 2020-12-14 DIAGNOSIS — Z794 Long term (current) use of insulin: Secondary | ICD-10-CM | POA: Diagnosis not present

## 2020-12-14 DIAGNOSIS — E1122 Type 2 diabetes mellitus with diabetic chronic kidney disease: Secondary | ICD-10-CM | POA: Diagnosis not present

## 2020-12-15 DIAGNOSIS — N186 End stage renal disease: Secondary | ICD-10-CM | POA: Diagnosis not present

## 2020-12-15 DIAGNOSIS — Z992 Dependence on renal dialysis: Secondary | ICD-10-CM | POA: Diagnosis not present

## 2020-12-17 DIAGNOSIS — N186 End stage renal disease: Secondary | ICD-10-CM | POA: Diagnosis not present

## 2020-12-17 DIAGNOSIS — Z992 Dependence on renal dialysis: Secondary | ICD-10-CM | POA: Diagnosis not present

## 2020-12-20 DIAGNOSIS — Z992 Dependence on renal dialysis: Secondary | ICD-10-CM | POA: Diagnosis not present

## 2020-12-20 DIAGNOSIS — N186 End stage renal disease: Secondary | ICD-10-CM | POA: Diagnosis not present

## 2020-12-21 DIAGNOSIS — Z992 Dependence on renal dialysis: Secondary | ICD-10-CM | POA: Diagnosis not present

## 2020-12-21 DIAGNOSIS — N186 End stage renal disease: Secondary | ICD-10-CM | POA: Diagnosis not present

## 2020-12-22 DIAGNOSIS — N186 End stage renal disease: Secondary | ICD-10-CM | POA: Diagnosis not present

## 2020-12-22 DIAGNOSIS — Z992 Dependence on renal dialysis: Secondary | ICD-10-CM | POA: Diagnosis not present

## 2020-12-24 DIAGNOSIS — N186 End stage renal disease: Secondary | ICD-10-CM | POA: Diagnosis not present

## 2020-12-24 DIAGNOSIS — Z992 Dependence on renal dialysis: Secondary | ICD-10-CM | POA: Diagnosis not present

## 2020-12-27 DIAGNOSIS — Z992 Dependence on renal dialysis: Secondary | ICD-10-CM | POA: Diagnosis not present

## 2020-12-27 DIAGNOSIS — N186 End stage renal disease: Secondary | ICD-10-CM | POA: Diagnosis not present

## 2020-12-29 DIAGNOSIS — N186 End stage renal disease: Secondary | ICD-10-CM | POA: Diagnosis not present

## 2020-12-29 DIAGNOSIS — Z992 Dependence on renal dialysis: Secondary | ICD-10-CM | POA: Diagnosis not present

## 2020-12-31 DIAGNOSIS — Z992 Dependence on renal dialysis: Secondary | ICD-10-CM | POA: Diagnosis not present

## 2020-12-31 DIAGNOSIS — N186 End stage renal disease: Secondary | ICD-10-CM | POA: Diagnosis not present

## 2021-01-03 DIAGNOSIS — N186 End stage renal disease: Secondary | ICD-10-CM | POA: Diagnosis not present

## 2021-01-03 DIAGNOSIS — Z992 Dependence on renal dialysis: Secondary | ICD-10-CM | POA: Diagnosis not present

## 2021-01-04 ENCOUNTER — Ambulatory Visit: Admit: 2021-01-04 | Payer: MEDICARE | Attending: Internal Medicine | Primary: Internal Medicine

## 2021-01-04 DIAGNOSIS — N186 End stage renal disease: Secondary | ICD-10-CM | POA: Diagnosis not present

## 2021-01-04 DIAGNOSIS — I871 Compression of vein: Secondary | ICD-10-CM | POA: Diagnosis not present

## 2021-01-05 DIAGNOSIS — N186 End stage renal disease: Secondary | ICD-10-CM | POA: Diagnosis not present

## 2021-01-05 DIAGNOSIS — Z992 Dependence on renal dialysis: Secondary | ICD-10-CM | POA: Diagnosis not present

## 2021-01-07 DIAGNOSIS — N186 End stage renal disease: Secondary | ICD-10-CM | POA: Diagnosis not present

## 2021-01-07 DIAGNOSIS — Z992 Dependence on renal dialysis: Secondary | ICD-10-CM | POA: Diagnosis not present

## 2021-01-10 DIAGNOSIS — Z992 Dependence on renal dialysis: Secondary | ICD-10-CM | POA: Diagnosis not present

## 2021-01-10 DIAGNOSIS — N186 End stage renal disease: Secondary | ICD-10-CM | POA: Diagnosis not present

## 2021-01-12 DIAGNOSIS — Z992 Dependence on renal dialysis: Secondary | ICD-10-CM | POA: Diagnosis not present

## 2021-01-12 DIAGNOSIS — N186 End stage renal disease: Secondary | ICD-10-CM | POA: Diagnosis not present

## 2021-01-14 DIAGNOSIS — Z992 Dependence on renal dialysis: Secondary | ICD-10-CM | POA: Diagnosis not present

## 2021-01-14 DIAGNOSIS — N186 End stage renal disease: Secondary | ICD-10-CM | POA: Diagnosis not present

## 2021-01-17 DIAGNOSIS — N186 End stage renal disease: Secondary | ICD-10-CM | POA: Diagnosis not present

## 2021-01-17 DIAGNOSIS — Z992 Dependence on renal dialysis: Secondary | ICD-10-CM | POA: Diagnosis not present

## 2021-01-19 DIAGNOSIS — Z992 Dependence on renal dialysis: Secondary | ICD-10-CM | POA: Diagnosis not present

## 2021-01-19 DIAGNOSIS — N186 End stage renal disease: Secondary | ICD-10-CM | POA: Diagnosis not present

## 2021-01-20 DIAGNOSIS — Z992 Dependence on renal dialysis: Secondary | ICD-10-CM | POA: Diagnosis not present

## 2021-01-20 DIAGNOSIS — N186 End stage renal disease: Secondary | ICD-10-CM | POA: Diagnosis not present

## 2021-01-21 DIAGNOSIS — Z992 Dependence on renal dialysis: Secondary | ICD-10-CM | POA: Diagnosis not present

## 2021-01-21 DIAGNOSIS — N186 End stage renal disease: Secondary | ICD-10-CM | POA: Diagnosis not present

## 2021-01-24 DIAGNOSIS — Z992 Dependence on renal dialysis: Secondary | ICD-10-CM | POA: Diagnosis not present

## 2021-01-24 DIAGNOSIS — N186 End stage renal disease: Secondary | ICD-10-CM | POA: Diagnosis not present

## 2021-01-26 DIAGNOSIS — Z992 Dependence on renal dialysis: Secondary | ICD-10-CM | POA: Diagnosis not present

## 2021-01-26 DIAGNOSIS — N186 End stage renal disease: Secondary | ICD-10-CM | POA: Diagnosis not present

## 2021-01-28 DIAGNOSIS — Z992 Dependence on renal dialysis: Secondary | ICD-10-CM | POA: Diagnosis not present

## 2021-01-28 DIAGNOSIS — N186 End stage renal disease: Secondary | ICD-10-CM | POA: Diagnosis not present

## 2021-01-29 DIAGNOSIS — N186 End stage renal disease: Secondary | ICD-10-CM | POA: Diagnosis not present

## 2021-01-29 DIAGNOSIS — Z992 Dependence on renal dialysis: Secondary | ICD-10-CM | POA: Diagnosis not present

## 2021-01-31 DIAGNOSIS — N186 End stage renal disease: Secondary | ICD-10-CM | POA: Diagnosis not present

## 2021-01-31 DIAGNOSIS — Z992 Dependence on renal dialysis: Secondary | ICD-10-CM | POA: Diagnosis not present

## 2021-02-02 DIAGNOSIS — Z992 Dependence on renal dialysis: Secondary | ICD-10-CM | POA: Diagnosis not present

## 2021-02-02 DIAGNOSIS — N186 End stage renal disease: Secondary | ICD-10-CM | POA: Diagnosis not present

## 2021-02-04 DIAGNOSIS — N186 End stage renal disease: Secondary | ICD-10-CM | POA: Diagnosis not present

## 2021-02-04 DIAGNOSIS — Z992 Dependence on renal dialysis: Secondary | ICD-10-CM | POA: Diagnosis not present

## 2021-02-07 DIAGNOSIS — Z992 Dependence on renal dialysis: Secondary | ICD-10-CM | POA: Diagnosis not present

## 2021-02-07 DIAGNOSIS — N186 End stage renal disease: Secondary | ICD-10-CM | POA: Diagnosis not present

## 2021-02-09 ENCOUNTER — Encounter: Admit: 2021-02-09 | Discharge: 2021-02-09 | Payer: MEDICARE | Attending: Internal Medicine | Primary: Internal Medicine

## 2021-02-09 DIAGNOSIS — N186 End stage renal disease: Secondary | ICD-10-CM | POA: Diagnosis not present

## 2021-02-09 DIAGNOSIS — Z992 Dependence on renal dialysis: Secondary | ICD-10-CM | POA: Diagnosis not present

## 2021-02-11 DIAGNOSIS — N186 End stage renal disease: Secondary | ICD-10-CM | POA: Diagnosis not present

## 2021-02-11 DIAGNOSIS — Z992 Dependence on renal dialysis: Secondary | ICD-10-CM | POA: Diagnosis not present

## 2021-02-14 DIAGNOSIS — Z992 Dependence on renal dialysis: Secondary | ICD-10-CM | POA: Diagnosis not present

## 2021-02-14 DIAGNOSIS — N186 End stage renal disease: Secondary | ICD-10-CM | POA: Diagnosis not present

## 2021-02-14 DIAGNOSIS — Z01818 Encounter for other preprocedural examination: Principal | ICD-10-CM

## 2021-02-14 DIAGNOSIS — Z7682 Awaiting organ transplant status: Principal | ICD-10-CM

## 2021-02-16 DIAGNOSIS — I2510 Atherosclerotic heart disease of native coronary artery without angina pectoris: Secondary | ICD-10-CM | POA: Diagnosis not present

## 2021-02-16 DIAGNOSIS — I700 Atherosclerosis of aorta: Secondary | ICD-10-CM | POA: Diagnosis not present

## 2021-02-16 DIAGNOSIS — M7989 Other specified soft tissue disorders: Secondary | ICD-10-CM | POA: Diagnosis not present

## 2021-02-16 DIAGNOSIS — E1121 Type 2 diabetes mellitus with diabetic nephropathy: Secondary | ICD-10-CM | POA: Diagnosis not present

## 2021-02-16 DIAGNOSIS — Z79899 Other long term (current) drug therapy: Secondary | ICD-10-CM | POA: Diagnosis not present

## 2021-02-16 DIAGNOSIS — D649 Anemia, unspecified: Secondary | ICD-10-CM | POA: Diagnosis not present

## 2021-02-16 DIAGNOSIS — I10 Essential (primary) hypertension: Secondary | ICD-10-CM | POA: Diagnosis not present

## 2021-02-16 DIAGNOSIS — N186 End stage renal disease: Secondary | ICD-10-CM | POA: Diagnosis not present

## 2021-02-16 DIAGNOSIS — Z8781 Personal history of (healed) traumatic fracture: Secondary | ICD-10-CM | POA: Diagnosis not present

## 2021-02-16 DIAGNOSIS — E119 Type 2 diabetes mellitus without complications: Secondary | ICD-10-CM | POA: Diagnosis not present

## 2021-02-16 DIAGNOSIS — Z955 Presence of coronary angioplasty implant and graft: Secondary | ICD-10-CM | POA: Diagnosis not present

## 2021-02-16 DIAGNOSIS — Z992 Dependence on renal dialysis: Secondary | ICD-10-CM | POA: Diagnosis not present

## 2021-02-17 DIAGNOSIS — E1142 Type 2 diabetes mellitus with diabetic polyneuropathy: Secondary | ICD-10-CM | POA: Diagnosis not present

## 2021-02-17 DIAGNOSIS — Z0000 Encounter for general adult medical examination without abnormal findings: Secondary | ICD-10-CM | POA: Diagnosis not present

## 2021-02-17 DIAGNOSIS — E039 Hypothyroidism, unspecified: Secondary | ICD-10-CM | POA: Diagnosis not present

## 2021-02-17 DIAGNOSIS — I132 Hypertensive heart and chronic kidney disease with heart failure and with stage 5 chronic kidney disease, or end stage renal disease: Secondary | ICD-10-CM | POA: Diagnosis not present

## 2021-02-17 DIAGNOSIS — E1122 Type 2 diabetes mellitus with diabetic chronic kidney disease: Secondary | ICD-10-CM | POA: Diagnosis not present

## 2021-02-17 DIAGNOSIS — Z955 Presence of coronary angioplasty implant and graft: Secondary | ICD-10-CM | POA: Diagnosis not present

## 2021-02-17 DIAGNOSIS — N186 End stage renal disease: Secondary | ICD-10-CM | POA: Diagnosis not present

## 2021-02-17 DIAGNOSIS — Z79899 Other long term (current) drug therapy: Secondary | ICD-10-CM | POA: Diagnosis not present

## 2021-02-17 DIAGNOSIS — I2510 Atherosclerotic heart disease of native coronary artery without angina pectoris: Secondary | ICD-10-CM | POA: Diagnosis not present

## 2021-02-18 DIAGNOSIS — Z992 Dependence on renal dialysis: Secondary | ICD-10-CM | POA: Diagnosis not present

## 2021-02-18 DIAGNOSIS — N186 End stage renal disease: Secondary | ICD-10-CM | POA: Diagnosis not present

## 2021-02-20 DIAGNOSIS — N186 End stage renal disease: Secondary | ICD-10-CM | POA: Diagnosis not present

## 2021-02-20 DIAGNOSIS — Z992 Dependence on renal dialysis: Secondary | ICD-10-CM | POA: Diagnosis not present

## 2021-02-21 DIAGNOSIS — N186 End stage renal disease: Secondary | ICD-10-CM | POA: Diagnosis not present

## 2021-02-21 DIAGNOSIS — Z992 Dependence on renal dialysis: Secondary | ICD-10-CM | POA: Diagnosis not present

## 2021-02-22 ENCOUNTER — Institutional Professional Consult (permissible substitution): Admit: 2021-02-22 | Discharge: 2021-02-23 | Payer: MEDICARE

## 2021-02-23 DIAGNOSIS — Z992 Dependence on renal dialysis: Secondary | ICD-10-CM | POA: Diagnosis not present

## 2021-02-23 DIAGNOSIS — N186 End stage renal disease: Secondary | ICD-10-CM | POA: Diagnosis not present

## 2021-02-25 DIAGNOSIS — N186 End stage renal disease: Secondary | ICD-10-CM | POA: Diagnosis not present

## 2021-02-25 DIAGNOSIS — Z992 Dependence on renal dialysis: Secondary | ICD-10-CM | POA: Diagnosis not present

## 2021-02-28 DIAGNOSIS — N186 End stage renal disease: Secondary | ICD-10-CM | POA: Diagnosis not present

## 2021-02-28 DIAGNOSIS — Z992 Dependence on renal dialysis: Secondary | ICD-10-CM | POA: Diagnosis not present

## 2021-03-02 DIAGNOSIS — N186 End stage renal disease: Secondary | ICD-10-CM | POA: Diagnosis not present

## 2021-03-02 DIAGNOSIS — Z992 Dependence on renal dialysis: Secondary | ICD-10-CM | POA: Diagnosis not present

## 2021-03-04 DIAGNOSIS — Z992 Dependence on renal dialysis: Secondary | ICD-10-CM | POA: Diagnosis not present

## 2021-03-04 DIAGNOSIS — N186 End stage renal disease: Secondary | ICD-10-CM | POA: Diagnosis not present

## 2021-03-06 DIAGNOSIS — Z992 Dependence on renal dialysis: Secondary | ICD-10-CM | POA: Diagnosis not present

## 2021-03-06 DIAGNOSIS — N186 End stage renal disease: Secondary | ICD-10-CM | POA: Diagnosis not present

## 2021-03-07 DIAGNOSIS — Z992 Dependence on renal dialysis: Secondary | ICD-10-CM | POA: Diagnosis not present

## 2021-03-07 DIAGNOSIS — N186 End stage renal disease: Secondary | ICD-10-CM | POA: Diagnosis not present

## 2021-03-09 DIAGNOSIS — N186 End stage renal disease: Secondary | ICD-10-CM | POA: Diagnosis not present

## 2021-03-09 DIAGNOSIS — Z992 Dependence on renal dialysis: Secondary | ICD-10-CM | POA: Diagnosis not present

## 2021-03-10 ENCOUNTER — Ambulatory Visit: Admit: 2021-03-10 | Payer: MEDICARE

## 2021-03-10 ENCOUNTER — Ambulatory Visit: Admit: 2021-03-10 | Discharge: 2021-03-11 | Payer: MEDICARE

## 2021-03-10 ENCOUNTER — Encounter: Admit: 2021-03-10 | Discharge: 2021-03-11 | Payer: MEDICARE | Attending: Surgery | Primary: Surgery

## 2021-03-10 ENCOUNTER — Ambulatory Visit: Admit: 2021-03-10 | Discharge: 2021-03-12 | Payer: MEDICARE

## 2021-03-11 DIAGNOSIS — Z992 Dependence on renal dialysis: Secondary | ICD-10-CM | POA: Diagnosis not present

## 2021-03-11 DIAGNOSIS — N186 End stage renal disease: Secondary | ICD-10-CM | POA: Diagnosis not present

## 2021-03-14 DIAGNOSIS — N186 End stage renal disease: Secondary | ICD-10-CM | POA: Diagnosis not present

## 2021-03-14 DIAGNOSIS — Z992 Dependence on renal dialysis: Secondary | ICD-10-CM | POA: Diagnosis not present

## 2021-03-16 DIAGNOSIS — Z992 Dependence on renal dialysis: Secondary | ICD-10-CM | POA: Diagnosis not present

## 2021-03-16 DIAGNOSIS — N186 End stage renal disease: Secondary | ICD-10-CM | POA: Diagnosis not present

## 2021-03-18 DIAGNOSIS — N186 End stage renal disease: Secondary | ICD-10-CM | POA: Diagnosis not present

## 2021-03-18 DIAGNOSIS — Z992 Dependence on renal dialysis: Secondary | ICD-10-CM | POA: Diagnosis not present

## 2021-03-21 DIAGNOSIS — N186 End stage renal disease: Secondary | ICD-10-CM | POA: Diagnosis not present

## 2021-03-21 DIAGNOSIS — Z992 Dependence on renal dialysis: Secondary | ICD-10-CM | POA: Diagnosis not present

## 2021-03-22 ENCOUNTER — Ambulatory Visit: Admit: 2021-03-22 | Discharge: 2021-03-23 | Payer: MEDICARE

## 2021-03-22 DIAGNOSIS — E1122 Type 2 diabetes mellitus with diabetic chronic kidney disease: Principal | ICD-10-CM

## 2021-03-22 DIAGNOSIS — Z01810 Encounter for preprocedural cardiovascular examination: Principal | ICD-10-CM

## 2021-03-22 DIAGNOSIS — Z7289 Other problems related to lifestyle: Principal | ICD-10-CM

## 2021-03-22 DIAGNOSIS — N186 End stage renal disease: Principal | ICD-10-CM

## 2021-03-22 DIAGNOSIS — Z01818 Encounter for other preprocedural examination: Principal | ICD-10-CM

## 2021-03-23 DIAGNOSIS — N186 End stage renal disease: Secondary | ICD-10-CM | POA: Diagnosis not present

## 2021-03-23 DIAGNOSIS — Z992 Dependence on renal dialysis: Secondary | ICD-10-CM | POA: Diagnosis not present

## 2021-03-24 DIAGNOSIS — E113413 Type 2 diabetes mellitus with severe nonproliferative diabetic retinopathy with macular edema, bilateral: Secondary | ICD-10-CM | POA: Diagnosis not present

## 2021-03-24 DIAGNOSIS — Z992 Dependence on renal dialysis: Secondary | ICD-10-CM | POA: Diagnosis not present

## 2021-03-24 DIAGNOSIS — N186 End stage renal disease: Secondary | ICD-10-CM | POA: Diagnosis not present

## 2021-03-25 DIAGNOSIS — Z992 Dependence on renal dialysis: Secondary | ICD-10-CM | POA: Diagnosis not present

## 2021-03-25 DIAGNOSIS — N186 End stage renal disease: Secondary | ICD-10-CM | POA: Diagnosis not present

## 2021-03-28 DIAGNOSIS — N186 End stage renal disease: Secondary | ICD-10-CM | POA: Diagnosis not present

## 2021-03-28 DIAGNOSIS — Z992 Dependence on renal dialysis: Secondary | ICD-10-CM | POA: Diagnosis not present

## 2021-03-30 DIAGNOSIS — N186 End stage renal disease: Secondary | ICD-10-CM | POA: Diagnosis not present

## 2021-03-30 DIAGNOSIS — Z992 Dependence on renal dialysis: Secondary | ICD-10-CM | POA: Diagnosis not present

## 2021-03-31 ENCOUNTER — Ambulatory Visit: Admit: 2021-03-31 | Discharge: 2021-04-01 | Payer: MEDICARE | Attending: Surgery | Primary: Surgery

## 2021-03-31 ENCOUNTER — Ambulatory Visit: Admit: 2021-03-31 | Discharge: 2021-04-01 | Payer: MEDICARE

## 2021-03-31 DIAGNOSIS — Z01818 Encounter for other preprocedural examination: Principal | ICD-10-CM

## 2021-03-31 DIAGNOSIS — I5022 Chronic systolic (congestive) heart failure: Principal | ICD-10-CM

## 2021-04-01 DIAGNOSIS — Z992 Dependence on renal dialysis: Secondary | ICD-10-CM | POA: Diagnosis not present

## 2021-04-01 DIAGNOSIS — N186 End stage renal disease: Secondary | ICD-10-CM | POA: Diagnosis not present

## 2021-04-04 DIAGNOSIS — Z992 Dependence on renal dialysis: Secondary | ICD-10-CM | POA: Diagnosis not present

## 2021-04-04 DIAGNOSIS — N186 End stage renal disease: Secondary | ICD-10-CM | POA: Diagnosis not present

## 2021-04-05 DIAGNOSIS — N186 End stage renal disease: Secondary | ICD-10-CM | POA: Diagnosis not present

## 2021-04-05 DIAGNOSIS — Z992 Dependence on renal dialysis: Secondary | ICD-10-CM | POA: Diagnosis not present

## 2021-04-06 DIAGNOSIS — Z992 Dependence on renal dialysis: Secondary | ICD-10-CM | POA: Diagnosis not present

## 2021-04-06 DIAGNOSIS — N186 End stage renal disease: Secondary | ICD-10-CM | POA: Diagnosis not present

## 2021-04-08 DIAGNOSIS — N186 End stage renal disease: Secondary | ICD-10-CM | POA: Diagnosis not present

## 2021-04-08 DIAGNOSIS — Z992 Dependence on renal dialysis: Secondary | ICD-10-CM | POA: Diagnosis not present

## 2021-04-11 DIAGNOSIS — Z992 Dependence on renal dialysis: Secondary | ICD-10-CM | POA: Diagnosis not present

## 2021-04-11 DIAGNOSIS — N186 End stage renal disease: Secondary | ICD-10-CM | POA: Diagnosis not present

## 2021-04-13 DIAGNOSIS — N186 End stage renal disease: Secondary | ICD-10-CM | POA: Diagnosis not present

## 2021-04-13 DIAGNOSIS — Z992 Dependence on renal dialysis: Secondary | ICD-10-CM | POA: Diagnosis not present

## 2021-04-15 DIAGNOSIS — Z992 Dependence on renal dialysis: Secondary | ICD-10-CM | POA: Diagnosis not present

## 2021-04-15 DIAGNOSIS — N186 End stage renal disease: Secondary | ICD-10-CM | POA: Diagnosis not present

## 2021-04-18 DIAGNOSIS — N186 End stage renal disease: Secondary | ICD-10-CM | POA: Diagnosis not present

## 2021-04-18 DIAGNOSIS — Z992 Dependence on renal dialysis: Secondary | ICD-10-CM | POA: Diagnosis not present

## 2021-04-20 DIAGNOSIS — Z992 Dependence on renal dialysis: Secondary | ICD-10-CM | POA: Diagnosis not present

## 2021-04-20 DIAGNOSIS — N186 End stage renal disease: Secondary | ICD-10-CM | POA: Diagnosis not present

## 2021-04-22 DIAGNOSIS — Z992 Dependence on renal dialysis: Secondary | ICD-10-CM | POA: Diagnosis not present

## 2021-04-22 DIAGNOSIS — N186 End stage renal disease: Secondary | ICD-10-CM | POA: Diagnosis not present

## 2021-04-23 DIAGNOSIS — Z992 Dependence on renal dialysis: Secondary | ICD-10-CM | POA: Diagnosis not present

## 2021-04-23 DIAGNOSIS — N186 End stage renal disease: Secondary | ICD-10-CM | POA: Diagnosis not present

## 2021-04-25 DIAGNOSIS — N186 End stage renal disease: Secondary | ICD-10-CM | POA: Diagnosis not present

## 2021-04-25 DIAGNOSIS — Z992 Dependence on renal dialysis: Secondary | ICD-10-CM | POA: Diagnosis not present

## 2021-04-27 DIAGNOSIS — N186 End stage renal disease: Secondary | ICD-10-CM | POA: Diagnosis not present

## 2021-04-27 DIAGNOSIS — Z992 Dependence on renal dialysis: Secondary | ICD-10-CM | POA: Diagnosis not present

## 2021-04-29 DIAGNOSIS — N186 End stage renal disease: Secondary | ICD-10-CM | POA: Diagnosis not present

## 2021-04-29 DIAGNOSIS — Z992 Dependence on renal dialysis: Secondary | ICD-10-CM | POA: Diagnosis not present

## 2021-05-02 DIAGNOSIS — N186 End stage renal disease: Secondary | ICD-10-CM | POA: Diagnosis not present

## 2021-05-02 DIAGNOSIS — Z992 Dependence on renal dialysis: Secondary | ICD-10-CM | POA: Diagnosis not present

## 2021-05-04 DIAGNOSIS — Z992 Dependence on renal dialysis: Secondary | ICD-10-CM | POA: Diagnosis not present

## 2021-05-04 DIAGNOSIS — N186 End stage renal disease: Secondary | ICD-10-CM | POA: Diagnosis not present

## 2021-05-05 DIAGNOSIS — Z992 Dependence on renal dialysis: Secondary | ICD-10-CM | POA: Diagnosis not present

## 2021-05-05 DIAGNOSIS — N186 End stage renal disease: Secondary | ICD-10-CM | POA: Diagnosis not present

## 2021-05-06 DIAGNOSIS — Z992 Dependence on renal dialysis: Secondary | ICD-10-CM | POA: Diagnosis not present

## 2021-05-06 DIAGNOSIS — N186 End stage renal disease: Secondary | ICD-10-CM | POA: Diagnosis not present

## 2021-05-09 DIAGNOSIS — Z992 Dependence on renal dialysis: Secondary | ICD-10-CM | POA: Diagnosis not present

## 2021-05-09 DIAGNOSIS — N186 End stage renal disease: Secondary | ICD-10-CM | POA: Diagnosis not present

## 2021-05-11 DIAGNOSIS — Z992 Dependence on renal dialysis: Secondary | ICD-10-CM | POA: Diagnosis not present

## 2021-05-11 DIAGNOSIS — N186 End stage renal disease: Secondary | ICD-10-CM | POA: Diagnosis not present

## 2021-05-13 DIAGNOSIS — N186 End stage renal disease: Secondary | ICD-10-CM | POA: Diagnosis not present

## 2021-05-13 DIAGNOSIS — Z992 Dependence on renal dialysis: Secondary | ICD-10-CM | POA: Diagnosis not present

## 2021-05-16 DIAGNOSIS — N186 End stage renal disease: Secondary | ICD-10-CM | POA: Diagnosis not present

## 2021-05-16 DIAGNOSIS — Z992 Dependence on renal dialysis: Secondary | ICD-10-CM | POA: Diagnosis not present

## 2021-05-16 DIAGNOSIS — E119 Type 2 diabetes mellitus without complications: Secondary | ICD-10-CM | POA: Diagnosis not present

## 2021-05-18 DIAGNOSIS — N186 End stage renal disease: Secondary | ICD-10-CM | POA: Diagnosis not present

## 2021-05-18 DIAGNOSIS — Z992 Dependence on renal dialysis: Secondary | ICD-10-CM | POA: Diagnosis not present

## 2021-05-20 DIAGNOSIS — Z992 Dependence on renal dialysis: Secondary | ICD-10-CM | POA: Diagnosis not present

## 2021-05-20 DIAGNOSIS — N186 End stage renal disease: Secondary | ICD-10-CM | POA: Diagnosis not present

## 2021-05-23 DIAGNOSIS — N186 End stage renal disease: Secondary | ICD-10-CM | POA: Diagnosis not present

## 2021-05-23 DIAGNOSIS — Z992 Dependence on renal dialysis: Secondary | ICD-10-CM | POA: Diagnosis not present

## 2021-05-24 DIAGNOSIS — Z992 Dependence on renal dialysis: Secondary | ICD-10-CM | POA: Diagnosis not present

## 2021-05-24 DIAGNOSIS — N186 End stage renal disease: Secondary | ICD-10-CM | POA: Diagnosis not present

## 2021-05-25 DIAGNOSIS — N186 End stage renal disease: Secondary | ICD-10-CM | POA: Diagnosis not present

## 2021-05-25 DIAGNOSIS — Z992 Dependence on renal dialysis: Secondary | ICD-10-CM | POA: Diagnosis not present

## 2021-05-27 DIAGNOSIS — N186 End stage renal disease: Secondary | ICD-10-CM | POA: Diagnosis not present

## 2021-05-27 DIAGNOSIS — Z992 Dependence on renal dialysis: Secondary | ICD-10-CM | POA: Diagnosis not present

## 2021-05-28 ENCOUNTER — Other Ambulatory Visit: Payer: Self-pay | Admitting: Nurse Practitioner

## 2021-05-30 DIAGNOSIS — Z992 Dependence on renal dialysis: Secondary | ICD-10-CM | POA: Diagnosis not present

## 2021-05-30 DIAGNOSIS — N186 End stage renal disease: Secondary | ICD-10-CM | POA: Diagnosis not present

## 2021-06-01 DIAGNOSIS — N186 End stage renal disease: Secondary | ICD-10-CM | POA: Diagnosis not present

## 2021-06-01 DIAGNOSIS — Z992 Dependence on renal dialysis: Secondary | ICD-10-CM | POA: Diagnosis not present

## 2021-06-03 DIAGNOSIS — Z992 Dependence on renal dialysis: Secondary | ICD-10-CM | POA: Diagnosis not present

## 2021-06-03 DIAGNOSIS — N186 End stage renal disease: Secondary | ICD-10-CM | POA: Diagnosis not present

## 2021-06-04 DIAGNOSIS — N186 End stage renal disease: Secondary | ICD-10-CM | POA: Diagnosis not present

## 2021-06-04 DIAGNOSIS — Z992 Dependence on renal dialysis: Secondary | ICD-10-CM | POA: Diagnosis not present

## 2021-06-06 DIAGNOSIS — Z992 Dependence on renal dialysis: Secondary | ICD-10-CM | POA: Diagnosis not present

## 2021-06-06 DIAGNOSIS — N186 End stage renal disease: Secondary | ICD-10-CM | POA: Diagnosis not present

## 2021-06-08 DIAGNOSIS — Z992 Dependence on renal dialysis: Secondary | ICD-10-CM | POA: Diagnosis not present

## 2021-06-08 DIAGNOSIS — N186 End stage renal disease: Secondary | ICD-10-CM | POA: Diagnosis not present

## 2021-06-10 DIAGNOSIS — N186 End stage renal disease: Secondary | ICD-10-CM | POA: Diagnosis not present

## 2021-06-10 DIAGNOSIS — Z992 Dependence on renal dialysis: Secondary | ICD-10-CM | POA: Diagnosis not present

## 2021-06-13 DIAGNOSIS — N186 End stage renal disease: Secondary | ICD-10-CM | POA: Diagnosis not present

## 2021-06-13 DIAGNOSIS — Z992 Dependence on renal dialysis: Secondary | ICD-10-CM | POA: Diagnosis not present

## 2021-06-15 DIAGNOSIS — N186 End stage renal disease: Secondary | ICD-10-CM | POA: Diagnosis not present

## 2021-06-15 DIAGNOSIS — Z992 Dependence on renal dialysis: Secondary | ICD-10-CM | POA: Diagnosis not present

## 2021-06-17 DIAGNOSIS — N186 End stage renal disease: Secondary | ICD-10-CM | POA: Diagnosis not present

## 2021-06-17 DIAGNOSIS — Z992 Dependence on renal dialysis: Secondary | ICD-10-CM | POA: Diagnosis not present

## 2021-06-20 DIAGNOSIS — Z992 Dependence on renal dialysis: Secondary | ICD-10-CM | POA: Diagnosis not present

## 2021-06-20 DIAGNOSIS — N186 End stage renal disease: Secondary | ICD-10-CM | POA: Diagnosis not present

## 2021-06-21 DIAGNOSIS — R7309 Other abnormal glucose: Secondary | ICD-10-CM | POA: Diagnosis not present

## 2021-06-21 DIAGNOSIS — Z79899 Other long term (current) drug therapy: Secondary | ICD-10-CM | POA: Diagnosis not present

## 2021-06-21 DIAGNOSIS — E039 Hypothyroidism, unspecified: Secondary | ICD-10-CM | POA: Diagnosis not present

## 2021-06-21 DIAGNOSIS — I2510 Atherosclerotic heart disease of native coronary artery without angina pectoris: Secondary | ICD-10-CM | POA: Diagnosis not present

## 2021-06-21 DIAGNOSIS — I10 Essential (primary) hypertension: Secondary | ICD-10-CM | POA: Diagnosis not present

## 2021-06-21 DIAGNOSIS — Z0000 Encounter for general adult medical examination without abnormal findings: Secondary | ICD-10-CM | POA: Diagnosis not present

## 2021-06-21 DIAGNOSIS — Z955 Presence of coronary angioplasty implant and graft: Secondary | ICD-10-CM | POA: Diagnosis not present

## 2021-06-21 DIAGNOSIS — G63 Polyneuropathy in diseases classified elsewhere: Secondary | ICD-10-CM | POA: Diagnosis not present

## 2021-06-21 DIAGNOSIS — R8290 Unspecified abnormal findings in urine: Secondary | ICD-10-CM | POA: Diagnosis not present

## 2021-06-22 DIAGNOSIS — Z992 Dependence on renal dialysis: Secondary | ICD-10-CM | POA: Diagnosis not present

## 2021-06-22 DIAGNOSIS — N186 End stage renal disease: Secondary | ICD-10-CM | POA: Diagnosis not present

## 2021-06-23 DIAGNOSIS — N186 End stage renal disease: Secondary | ICD-10-CM | POA: Diagnosis not present

## 2021-06-23 DIAGNOSIS — Z992 Dependence on renal dialysis: Secondary | ICD-10-CM | POA: Diagnosis not present

## 2021-06-24 DIAGNOSIS — N186 End stage renal disease: Secondary | ICD-10-CM | POA: Diagnosis not present

## 2021-06-24 DIAGNOSIS — Z992 Dependence on renal dialysis: Secondary | ICD-10-CM | POA: Diagnosis not present

## 2021-06-27 DIAGNOSIS — N186 End stage renal disease: Secondary | ICD-10-CM | POA: Diagnosis not present

## 2021-06-27 DIAGNOSIS — Z992 Dependence on renal dialysis: Secondary | ICD-10-CM | POA: Diagnosis not present

## 2021-06-28 DIAGNOSIS — E1122 Type 2 diabetes mellitus with diabetic chronic kidney disease: Secondary | ICD-10-CM | POA: Diagnosis not present

## 2021-06-28 DIAGNOSIS — I132 Hypertensive heart and chronic kidney disease with heart failure and with stage 5 chronic kidney disease, or end stage renal disease: Secondary | ICD-10-CM | POA: Diagnosis not present

## 2021-06-28 DIAGNOSIS — I2510 Atherosclerotic heart disease of native coronary artery without angina pectoris: Secondary | ICD-10-CM | POA: Diagnosis not present

## 2021-06-28 DIAGNOSIS — E782 Mixed hyperlipidemia: Secondary | ICD-10-CM | POA: Diagnosis not present

## 2021-06-28 DIAGNOSIS — I770 Arteriovenous fistula, acquired: Secondary | ICD-10-CM | POA: Diagnosis not present

## 2021-06-28 DIAGNOSIS — Z992 Dependence on renal dialysis: Secondary | ICD-10-CM | POA: Diagnosis not present

## 2021-06-28 DIAGNOSIS — N186 End stage renal disease: Secondary | ICD-10-CM | POA: Diagnosis not present

## 2021-06-28 DIAGNOSIS — Z794 Long term (current) use of insulin: Secondary | ICD-10-CM | POA: Diagnosis not present

## 2021-06-28 DIAGNOSIS — E1142 Type 2 diabetes mellitus with diabetic polyneuropathy: Secondary | ICD-10-CM | POA: Diagnosis not present

## 2021-06-29 DIAGNOSIS — Z992 Dependence on renal dialysis: Secondary | ICD-10-CM | POA: Diagnosis not present

## 2021-06-29 DIAGNOSIS — N186 End stage renal disease: Secondary | ICD-10-CM | POA: Diagnosis not present

## 2021-07-01 DIAGNOSIS — Z992 Dependence on renal dialysis: Secondary | ICD-10-CM | POA: Diagnosis not present

## 2021-07-01 DIAGNOSIS — N186 End stage renal disease: Secondary | ICD-10-CM | POA: Diagnosis not present

## 2021-07-04 DIAGNOSIS — Z992 Dependence on renal dialysis: Secondary | ICD-10-CM | POA: Diagnosis not present

## 2021-07-04 DIAGNOSIS — N186 End stage renal disease: Secondary | ICD-10-CM | POA: Diagnosis not present

## 2021-07-06 DIAGNOSIS — Z992 Dependence on renal dialysis: Secondary | ICD-10-CM | POA: Diagnosis not present

## 2021-07-06 DIAGNOSIS — N186 End stage renal disease: Secondary | ICD-10-CM | POA: Diagnosis not present

## 2021-07-08 DIAGNOSIS — N186 End stage renal disease: Secondary | ICD-10-CM | POA: Diagnosis not present

## 2021-07-08 DIAGNOSIS — Z992 Dependence on renal dialysis: Secondary | ICD-10-CM | POA: Diagnosis not present

## 2021-07-08 DIAGNOSIS — Z01818 Encounter for other preprocedural examination: Principal | ICD-10-CM

## 2021-07-08 DIAGNOSIS — Z01810 Encounter for preprocedural cardiovascular examination: Principal | ICD-10-CM

## 2021-07-08 DIAGNOSIS — Z7682 Awaiting organ transplant status: Principal | ICD-10-CM

## 2021-07-09 ENCOUNTER — Ambulatory Visit: Admit: 2021-07-09 | Discharge: 2021-07-09 | Payer: MEDICARE

## 2021-07-11 DIAGNOSIS — N186 End stage renal disease: Secondary | ICD-10-CM | POA: Diagnosis not present

## 2021-07-11 DIAGNOSIS — Z992 Dependence on renal dialysis: Secondary | ICD-10-CM | POA: Diagnosis not present

## 2021-07-13 DIAGNOSIS — Z992 Dependence on renal dialysis: Secondary | ICD-10-CM | POA: Diagnosis not present

## 2021-07-13 DIAGNOSIS — N186 End stage renal disease: Secondary | ICD-10-CM | POA: Diagnosis not present

## 2021-07-15 DIAGNOSIS — N186 End stage renal disease: Secondary | ICD-10-CM | POA: Diagnosis not present

## 2021-07-15 DIAGNOSIS — Z992 Dependence on renal dialysis: Secondary | ICD-10-CM | POA: Diagnosis not present

## 2021-07-18 DIAGNOSIS — N186 End stage renal disease: Secondary | ICD-10-CM | POA: Diagnosis not present

## 2021-07-18 DIAGNOSIS — Z992 Dependence on renal dialysis: Secondary | ICD-10-CM | POA: Diagnosis not present

## 2021-07-20 DIAGNOSIS — Z992 Dependence on renal dialysis: Secondary | ICD-10-CM | POA: Diagnosis not present

## 2021-07-20 DIAGNOSIS — N186 End stage renal disease: Secondary | ICD-10-CM | POA: Diagnosis not present

## 2021-07-21 ENCOUNTER — Other Ambulatory Visit
Admission: RE | Admit: 2021-07-21 | Discharge: 2021-07-21 | Disposition: A | Discharge status: Home / Self Care | Payer: Medicare HMO | Source: Ambulatory Visit | Attending: Student | Admitting: Student

## 2021-07-21 DIAGNOSIS — R051 Acute cough: Secondary | ICD-10-CM | POA: Insufficient documentation

## 2021-07-21 DIAGNOSIS — I517 Cardiomegaly: Secondary | ICD-10-CM | POA: Diagnosis not present

## 2021-07-21 DIAGNOSIS — R059 Cough, unspecified: Secondary | ICD-10-CM | POA: Diagnosis not present

## 2021-07-21 DIAGNOSIS — Z03818 Encounter for observation for suspected exposure to other biological agents ruled out: Secondary | ICD-10-CM | POA: Diagnosis not present

## 2021-07-21 DIAGNOSIS — J40 Bronchitis, not specified as acute or chronic: Secondary | ICD-10-CM | POA: Diagnosis not present

## 2021-07-21 DIAGNOSIS — J019 Acute sinusitis, unspecified: Secondary | ICD-10-CM | POA: Diagnosis not present

## 2021-07-21 DIAGNOSIS — N186 End stage renal disease: Secondary | ICD-10-CM | POA: Diagnosis not present

## 2021-07-21 DIAGNOSIS — I5022 Chronic systolic (congestive) heart failure: Secondary | ICD-10-CM | POA: Insufficient documentation

## 2021-07-21 DIAGNOSIS — U071 COVID-19: Secondary | ICD-10-CM | POA: Diagnosis not present

## 2021-07-21 LAB — BRAIN NATRIURETIC PEPTIDE: B Natriuretic Peptide: 1139.3 pg/mL — ABNORMAL HIGH (ref 0.0–100.0)

## 2021-07-22 DIAGNOSIS — Z992 Dependence on renal dialysis: Secondary | ICD-10-CM | POA: Diagnosis not present

## 2021-07-22 DIAGNOSIS — N186 End stage renal disease: Secondary | ICD-10-CM | POA: Diagnosis not present

## 2021-07-23 DIAGNOSIS — Z992 Dependence on renal dialysis: Secondary | ICD-10-CM | POA: Diagnosis not present

## 2021-07-23 DIAGNOSIS — N186 End stage renal disease: Secondary | ICD-10-CM | POA: Diagnosis not present

## 2021-07-24 DIAGNOSIS — N186 End stage renal disease: Secondary | ICD-10-CM | POA: Diagnosis not present

## 2021-07-24 DIAGNOSIS — Z992 Dependence on renal dialysis: Secondary | ICD-10-CM | POA: Diagnosis not present

## 2021-07-25 DIAGNOSIS — N186 End stage renal disease: Secondary | ICD-10-CM | POA: Diagnosis not present

## 2021-07-25 DIAGNOSIS — Z992 Dependence on renal dialysis: Secondary | ICD-10-CM | POA: Diagnosis not present

## 2021-07-27 DIAGNOSIS — Z992 Dependence on renal dialysis: Secondary | ICD-10-CM | POA: Diagnosis not present

## 2021-07-27 DIAGNOSIS — N186 End stage renal disease: Secondary | ICD-10-CM | POA: Diagnosis not present

## 2021-07-29 DIAGNOSIS — N186 End stage renal disease: Secondary | ICD-10-CM | POA: Diagnosis not present

## 2021-07-29 DIAGNOSIS — Z992 Dependence on renal dialysis: Secondary | ICD-10-CM | POA: Diagnosis not present

## 2021-08-01 DIAGNOSIS — Z992 Dependence on renal dialysis: Secondary | ICD-10-CM | POA: Diagnosis not present

## 2021-08-01 DIAGNOSIS — N186 End stage renal disease: Secondary | ICD-10-CM | POA: Diagnosis not present

## 2021-08-03 DIAGNOSIS — Z992 Dependence on renal dialysis: Secondary | ICD-10-CM | POA: Diagnosis not present

## 2021-08-03 DIAGNOSIS — N186 End stage renal disease: Secondary | ICD-10-CM | POA: Diagnosis not present

## 2021-08-05 DIAGNOSIS — N186 End stage renal disease: Secondary | ICD-10-CM | POA: Diagnosis not present

## 2021-08-05 DIAGNOSIS — Z992 Dependence on renal dialysis: Secondary | ICD-10-CM | POA: Diagnosis not present

## 2021-08-08 DIAGNOSIS — Z992 Dependence on renal dialysis: Secondary | ICD-10-CM | POA: Diagnosis not present

## 2021-08-08 DIAGNOSIS — N186 End stage renal disease: Secondary | ICD-10-CM | POA: Diagnosis not present

## 2021-08-10 DIAGNOSIS — Z992 Dependence on renal dialysis: Secondary | ICD-10-CM | POA: Diagnosis not present

## 2021-08-10 DIAGNOSIS — N186 End stage renal disease: Secondary | ICD-10-CM | POA: Diagnosis not present

## 2021-08-12 DIAGNOSIS — N186 End stage renal disease: Secondary | ICD-10-CM | POA: Diagnosis not present

## 2021-08-12 DIAGNOSIS — Z992 Dependence on renal dialysis: Secondary | ICD-10-CM | POA: Diagnosis not present

## 2021-08-15 DIAGNOSIS — N186 End stage renal disease: Secondary | ICD-10-CM | POA: Diagnosis not present

## 2021-08-15 DIAGNOSIS — Z992 Dependence on renal dialysis: Secondary | ICD-10-CM | POA: Diagnosis not present

## 2021-08-15 DIAGNOSIS — E119 Type 2 diabetes mellitus without complications: Secondary | ICD-10-CM | POA: Diagnosis not present

## 2021-08-17 DIAGNOSIS — N186 End stage renal disease: Secondary | ICD-10-CM | POA: Diagnosis not present

## 2021-08-17 DIAGNOSIS — Z992 Dependence on renal dialysis: Secondary | ICD-10-CM | POA: Diagnosis not present

## 2021-08-19 DIAGNOSIS — Z992 Dependence on renal dialysis: Secondary | ICD-10-CM | POA: Diagnosis not present

## 2021-08-19 DIAGNOSIS — N186 End stage renal disease: Secondary | ICD-10-CM | POA: Diagnosis not present

## 2021-08-22 DIAGNOSIS — N186 End stage renal disease: Secondary | ICD-10-CM | POA: Diagnosis not present

## 2021-08-22 DIAGNOSIS — Z992 Dependence on renal dialysis: Secondary | ICD-10-CM | POA: Diagnosis not present

## 2021-08-23 DIAGNOSIS — N186 End stage renal disease: Secondary | ICD-10-CM | POA: Diagnosis not present

## 2021-08-23 DIAGNOSIS — Z992 Dependence on renal dialysis: Secondary | ICD-10-CM | POA: Diagnosis not present

## 2021-08-24 DIAGNOSIS — N186 End stage renal disease: Secondary | ICD-10-CM | POA: Diagnosis not present

## 2021-08-24 DIAGNOSIS — Z992 Dependence on renal dialysis: Secondary | ICD-10-CM | POA: Diagnosis not present

## 2021-08-26 DIAGNOSIS — Z992 Dependence on renal dialysis: Secondary | ICD-10-CM | POA: Diagnosis not present

## 2021-08-26 DIAGNOSIS — N186 End stage renal disease: Secondary | ICD-10-CM | POA: Diagnosis not present

## 2021-08-29 DIAGNOSIS — N186 End stage renal disease: Secondary | ICD-10-CM | POA: Diagnosis not present

## 2021-08-29 DIAGNOSIS — Z992 Dependence on renal dialysis: Secondary | ICD-10-CM | POA: Diagnosis not present

## 2021-08-31 DIAGNOSIS — N186 End stage renal disease: Secondary | ICD-10-CM | POA: Diagnosis not present

## 2021-08-31 DIAGNOSIS — Z992 Dependence on renal dialysis: Secondary | ICD-10-CM | POA: Diagnosis not present

## 2021-09-02 DIAGNOSIS — Z992 Dependence on renal dialysis: Secondary | ICD-10-CM | POA: Diagnosis not present

## 2021-09-02 DIAGNOSIS — N186 End stage renal disease: Secondary | ICD-10-CM | POA: Diagnosis not present

## 2021-09-05 DIAGNOSIS — Z992 Dependence on renal dialysis: Secondary | ICD-10-CM | POA: Diagnosis not present

## 2021-09-05 DIAGNOSIS — N186 End stage renal disease: Secondary | ICD-10-CM | POA: Diagnosis not present

## 2021-09-07 DIAGNOSIS — Z992 Dependence on renal dialysis: Secondary | ICD-10-CM | POA: Diagnosis not present

## 2021-09-07 DIAGNOSIS — N186 End stage renal disease: Secondary | ICD-10-CM | POA: Diagnosis not present

## 2021-09-09 DIAGNOSIS — N186 End stage renal disease: Secondary | ICD-10-CM | POA: Diagnosis not present

## 2021-09-09 DIAGNOSIS — Z992 Dependence on renal dialysis: Secondary | ICD-10-CM | POA: Diagnosis not present

## 2021-09-12 DIAGNOSIS — Z992 Dependence on renal dialysis: Secondary | ICD-10-CM | POA: Diagnosis not present

## 2021-09-12 DIAGNOSIS — N186 End stage renal disease: Secondary | ICD-10-CM | POA: Diagnosis not present

## 2021-09-14 DIAGNOSIS — Z992 Dependence on renal dialysis: Secondary | ICD-10-CM | POA: Diagnosis not present

## 2021-09-14 DIAGNOSIS — N186 End stage renal disease: Secondary | ICD-10-CM | POA: Diagnosis not present

## 2021-09-16 DIAGNOSIS — Z992 Dependence on renal dialysis: Secondary | ICD-10-CM | POA: Diagnosis not present

## 2021-09-16 DIAGNOSIS — N186 End stage renal disease: Secondary | ICD-10-CM | POA: Diagnosis not present

## 2021-09-19 DIAGNOSIS — Z992 Dependence on renal dialysis: Secondary | ICD-10-CM | POA: Diagnosis not present

## 2021-09-19 DIAGNOSIS — N186 End stage renal disease: Secondary | ICD-10-CM | POA: Diagnosis not present

## 2021-09-20 DIAGNOSIS — N186 End stage renal disease: Secondary | ICD-10-CM | POA: Diagnosis not present

## 2021-09-20 DIAGNOSIS — Z992 Dependence on renal dialysis: Secondary | ICD-10-CM | POA: Diagnosis not present

## 2021-09-21 DIAGNOSIS — J40 Bronchitis, not specified as acute or chronic: Secondary | ICD-10-CM | POA: Diagnosis not present

## 2021-09-21 DIAGNOSIS — N186 End stage renal disease: Secondary | ICD-10-CM | POA: Diagnosis not present

## 2021-09-21 DIAGNOSIS — Z992 Dependence on renal dialysis: Secondary | ICD-10-CM | POA: Diagnosis not present

## 2021-09-21 DIAGNOSIS — R051 Acute cough: Secondary | ICD-10-CM | POA: Diagnosis not present

## 2021-09-22 DIAGNOSIS — H524 Presbyopia: Secondary | ICD-10-CM | POA: Diagnosis not present

## 2021-09-22 DIAGNOSIS — E113413 Type 2 diabetes mellitus with severe nonproliferative diabetic retinopathy with macular edema, bilateral: Secondary | ICD-10-CM | POA: Diagnosis not present

## 2021-09-23 DIAGNOSIS — Z992 Dependence on renal dialysis: Secondary | ICD-10-CM | POA: Diagnosis not present

## 2021-09-23 DIAGNOSIS — N186 End stage renal disease: Secondary | ICD-10-CM | POA: Diagnosis not present

## 2021-09-26 DIAGNOSIS — Z992 Dependence on renal dialysis: Secondary | ICD-10-CM | POA: Diagnosis not present

## 2021-09-26 DIAGNOSIS — N186 End stage renal disease: Secondary | ICD-10-CM | POA: Diagnosis not present

## 2021-09-28 DIAGNOSIS — Z992 Dependence on renal dialysis: Secondary | ICD-10-CM | POA: Diagnosis not present

## 2021-09-28 DIAGNOSIS — N186 End stage renal disease: Secondary | ICD-10-CM | POA: Diagnosis not present

## 2021-09-30 DIAGNOSIS — N186 End stage renal disease: Secondary | ICD-10-CM | POA: Diagnosis not present

## 2021-09-30 DIAGNOSIS — Z992 Dependence on renal dialysis: Secondary | ICD-10-CM | POA: Diagnosis not present

## 2021-10-03 DIAGNOSIS — N186 End stage renal disease: Secondary | ICD-10-CM | POA: Diagnosis not present

## 2021-10-03 DIAGNOSIS — Z992 Dependence on renal dialysis: Secondary | ICD-10-CM | POA: Diagnosis not present

## 2021-10-05 DIAGNOSIS — N186 End stage renal disease: Secondary | ICD-10-CM | POA: Diagnosis not present

## 2021-10-05 DIAGNOSIS — Z992 Dependence on renal dialysis: Secondary | ICD-10-CM | POA: Diagnosis not present

## 2021-10-07 DIAGNOSIS — N186 End stage renal disease: Secondary | ICD-10-CM | POA: Diagnosis not present

## 2021-10-07 DIAGNOSIS — Z992 Dependence on renal dialysis: Secondary | ICD-10-CM | POA: Diagnosis not present

## 2021-10-10 DIAGNOSIS — N186 End stage renal disease: Secondary | ICD-10-CM | POA: Diagnosis not present

## 2021-10-10 DIAGNOSIS — Z992 Dependence on renal dialysis: Secondary | ICD-10-CM | POA: Diagnosis not present

## 2021-10-12 DIAGNOSIS — Z992 Dependence on renal dialysis: Secondary | ICD-10-CM | POA: Diagnosis not present

## 2021-10-12 DIAGNOSIS — N186 End stage renal disease: Secondary | ICD-10-CM | POA: Diagnosis not present

## 2021-10-14 DIAGNOSIS — N186 End stage renal disease: Secondary | ICD-10-CM | POA: Diagnosis not present

## 2021-10-14 DIAGNOSIS — Z992 Dependence on renal dialysis: Secondary | ICD-10-CM | POA: Diagnosis not present

## 2021-10-17 DIAGNOSIS — N186 End stage renal disease: Secondary | ICD-10-CM | POA: Diagnosis not present

## 2021-10-17 DIAGNOSIS — Z992 Dependence on renal dialysis: Secondary | ICD-10-CM | POA: Diagnosis not present

## 2021-10-19 DIAGNOSIS — Z992 Dependence on renal dialysis: Secondary | ICD-10-CM | POA: Diagnosis not present

## 2021-10-19 DIAGNOSIS — N186 End stage renal disease: Secondary | ICD-10-CM | POA: Diagnosis not present

## 2021-10-20 DIAGNOSIS — Z794 Long term (current) use of insulin: Secondary | ICD-10-CM | POA: Diagnosis not present

## 2021-10-20 DIAGNOSIS — I700 Atherosclerosis of aorta: Secondary | ICD-10-CM | POA: Diagnosis not present

## 2021-10-20 DIAGNOSIS — E1122 Type 2 diabetes mellitus with diabetic chronic kidney disease: Secondary | ICD-10-CM | POA: Diagnosis not present

## 2021-10-20 DIAGNOSIS — Z955 Presence of coronary angioplasty implant and graft: Secondary | ICD-10-CM | POA: Diagnosis not present

## 2021-10-20 DIAGNOSIS — N186 End stage renal disease: Secondary | ICD-10-CM | POA: Diagnosis not present

## 2021-10-20 DIAGNOSIS — I2510 Atherosclerotic heart disease of native coronary artery without angina pectoris: Secondary | ICD-10-CM | POA: Diagnosis not present

## 2021-10-20 DIAGNOSIS — Z1321 Encounter for screening for nutritional disorder: Secondary | ICD-10-CM | POA: Diagnosis not present

## 2021-10-20 DIAGNOSIS — D649 Anemia, unspecified: Secondary | ICD-10-CM | POA: Diagnosis not present

## 2021-10-20 DIAGNOSIS — I10 Essential (primary) hypertension: Secondary | ICD-10-CM | POA: Diagnosis not present

## 2021-10-21 DIAGNOSIS — I2510 Atherosclerotic heart disease of native coronary artery without angina pectoris: Secondary | ICD-10-CM | POA: Diagnosis not present

## 2021-10-21 DIAGNOSIS — Z992 Dependence on renal dialysis: Secondary | ICD-10-CM | POA: Diagnosis not present

## 2021-10-21 DIAGNOSIS — E1122 Type 2 diabetes mellitus with diabetic chronic kidney disease: Secondary | ICD-10-CM | POA: Diagnosis not present

## 2021-10-21 DIAGNOSIS — Z955 Presence of coronary angioplasty implant and graft: Secondary | ICD-10-CM | POA: Diagnosis not present

## 2021-10-21 DIAGNOSIS — I700 Atherosclerosis of aorta: Secondary | ICD-10-CM | POA: Diagnosis not present

## 2021-10-21 DIAGNOSIS — E782 Mixed hyperlipidemia: Secondary | ICD-10-CM | POA: Diagnosis not present

## 2021-10-21 DIAGNOSIS — N186 End stage renal disease: Secondary | ICD-10-CM | POA: Diagnosis not present

## 2021-10-21 DIAGNOSIS — I10 Essential (primary) hypertension: Secondary | ICD-10-CM | POA: Diagnosis not present

## 2021-10-21 DIAGNOSIS — Z794 Long term (current) use of insulin: Secondary | ICD-10-CM | POA: Diagnosis not present

## 2021-10-24 DIAGNOSIS — N186 End stage renal disease: Secondary | ICD-10-CM | POA: Diagnosis not present

## 2021-10-24 DIAGNOSIS — Z992 Dependence on renal dialysis: Secondary | ICD-10-CM | POA: Diagnosis not present

## 2021-10-26 DIAGNOSIS — N186 End stage renal disease: Secondary | ICD-10-CM | POA: Diagnosis not present

## 2021-10-26 DIAGNOSIS — Z992 Dependence on renal dialysis: Secondary | ICD-10-CM | POA: Diagnosis not present

## 2021-10-27 ENCOUNTER — Other Ambulatory Visit: Payer: Self-pay | Admitting: Internal Medicine

## 2021-10-27 DIAGNOSIS — I2510 Atherosclerotic heart disease of native coronary artery without angina pectoris: Secondary | ICD-10-CM | POA: Diagnosis not present

## 2021-10-27 DIAGNOSIS — E039 Hypothyroidism, unspecified: Secondary | ICD-10-CM | POA: Diagnosis not present

## 2021-10-27 DIAGNOSIS — I5022 Chronic systolic (congestive) heart failure: Secondary | ICD-10-CM | POA: Diagnosis not present

## 2021-10-27 DIAGNOSIS — Z1231 Encounter for screening mammogram for malignant neoplasm of breast: Secondary | ICD-10-CM | POA: Diagnosis not present

## 2021-10-27 DIAGNOSIS — H9193 Unspecified hearing loss, bilateral: Secondary | ICD-10-CM | POA: Diagnosis not present

## 2021-10-27 DIAGNOSIS — Z955 Presence of coronary angioplasty implant and graft: Secondary | ICD-10-CM | POA: Diagnosis not present

## 2021-10-27 DIAGNOSIS — E1142 Type 2 diabetes mellitus with diabetic polyneuropathy: Secondary | ICD-10-CM | POA: Diagnosis not present

## 2021-10-27 DIAGNOSIS — I132 Hypertensive heart and chronic kidney disease with heart failure and with stage 5 chronic kidney disease, or end stage renal disease: Secondary | ICD-10-CM | POA: Diagnosis not present

## 2021-10-27 DIAGNOSIS — Z0000 Encounter for general adult medical examination without abnormal findings: Secondary | ICD-10-CM | POA: Diagnosis not present

## 2021-10-27 DIAGNOSIS — Z1389 Encounter for screening for other disorder: Secondary | ICD-10-CM | POA: Diagnosis not present

## 2021-10-27 DIAGNOSIS — E1122 Type 2 diabetes mellitus with diabetic chronic kidney disease: Secondary | ICD-10-CM | POA: Diagnosis not present

## 2021-10-28 DIAGNOSIS — Z992 Dependence on renal dialysis: Secondary | ICD-10-CM | POA: Diagnosis not present

## 2021-10-28 DIAGNOSIS — N186 End stage renal disease: Secondary | ICD-10-CM | POA: Diagnosis not present

## 2021-10-31 DIAGNOSIS — N186 End stage renal disease: Secondary | ICD-10-CM | POA: Diagnosis not present

## 2021-10-31 DIAGNOSIS — Z992 Dependence on renal dialysis: Secondary | ICD-10-CM | POA: Diagnosis not present

## 2021-11-02 DIAGNOSIS — N186 End stage renal disease: Secondary | ICD-10-CM | POA: Diagnosis not present

## 2021-11-02 DIAGNOSIS — Z992 Dependence on renal dialysis: Secondary | ICD-10-CM | POA: Diagnosis not present

## 2021-11-04 DIAGNOSIS — N186 End stage renal disease: Secondary | ICD-10-CM | POA: Diagnosis not present

## 2021-11-04 DIAGNOSIS — Z992 Dependence on renal dialysis: Secondary | ICD-10-CM | POA: Diagnosis not present

## 2021-11-07 DIAGNOSIS — N186 End stage renal disease: Secondary | ICD-10-CM | POA: Diagnosis not present

## 2021-11-07 DIAGNOSIS — Z992 Dependence on renal dialysis: Secondary | ICD-10-CM | POA: Diagnosis not present

## 2021-11-09 DIAGNOSIS — Z992 Dependence on renal dialysis: Secondary | ICD-10-CM | POA: Diagnosis not present

## 2021-11-09 DIAGNOSIS — N186 End stage renal disease: Secondary | ICD-10-CM | POA: Diagnosis not present

## 2021-11-11 ENCOUNTER — Other Ambulatory Visit: Payer: Self-pay | Admitting: Nurse Practitioner

## 2021-11-11 DIAGNOSIS — Z992 Dependence on renal dialysis: Secondary | ICD-10-CM | POA: Diagnosis not present

## 2021-11-11 DIAGNOSIS — N186 End stage renal disease: Secondary | ICD-10-CM | POA: Diagnosis not present

## 2021-11-14 DIAGNOSIS — E1122 Type 2 diabetes mellitus with diabetic chronic kidney disease: Secondary | ICD-10-CM | POA: Diagnosis not present

## 2021-11-14 DIAGNOSIS — Z992 Dependence on renal dialysis: Secondary | ICD-10-CM | POA: Diagnosis not present

## 2021-11-14 DIAGNOSIS — N186 End stage renal disease: Secondary | ICD-10-CM | POA: Diagnosis not present

## 2021-11-16 DIAGNOSIS — Z992 Dependence on renal dialysis: Secondary | ICD-10-CM | POA: Diagnosis not present

## 2021-11-16 DIAGNOSIS — N186 End stage renal disease: Secondary | ICD-10-CM | POA: Diagnosis not present

## 2021-11-17 ENCOUNTER — Ambulatory Visit
Admit: 2021-11-17 | Discharge: 2021-11-17 | Disposition: A | Discharge status: Home / Self Care | Payer: MEDICARE | Attending: Emergency Medicine

## 2021-11-17 ENCOUNTER — Ambulatory Visit: Admit: 2021-11-17 | Discharge: 2021-11-17 | Disposition: A | Discharge status: Home / Self Care | Payer: MEDICARE

## 2021-11-17 ENCOUNTER — Institutional Professional Consult (permissible substitution): Admit: 2021-11-17 | Discharge: 2021-11-18 | Payer: MEDICARE

## 2021-11-17 DIAGNOSIS — N186 End stage renal disease: Secondary | ICD-10-CM | POA: Diagnosis not present

## 2021-11-17 DIAGNOSIS — I739 Peripheral vascular disease, unspecified: Secondary | ICD-10-CM | POA: Diagnosis not present

## 2021-11-17 DIAGNOSIS — Z880 Allergy status to penicillin: Secondary | ICD-10-CM | POA: Diagnosis not present

## 2021-11-17 DIAGNOSIS — Z992 Dependence on renal dialysis: Secondary | ICD-10-CM | POA: Diagnosis not present

## 2021-11-17 DIAGNOSIS — Z7682 Awaiting organ transplant status: Secondary | ICD-10-CM | POA: Diagnosis not present

## 2021-11-17 DIAGNOSIS — E1122 Type 2 diabetes mellitus with diabetic chronic kidney disease: Secondary | ICD-10-CM | POA: Diagnosis not present

## 2021-11-17 DIAGNOSIS — Z7982 Long term (current) use of aspirin: Secondary | ICD-10-CM | POA: Diagnosis not present

## 2021-11-17 DIAGNOSIS — I120 Hypertensive chronic kidney disease with stage 5 chronic kidney disease or end stage renal disease: Secondary | ICD-10-CM | POA: Diagnosis not present

## 2021-11-17 DIAGNOSIS — E039 Hypothyroidism, unspecified: Secondary | ICD-10-CM | POA: Diagnosis not present

## 2021-11-17 DIAGNOSIS — Z79899 Other long term (current) drug therapy: Secondary | ICD-10-CM | POA: Diagnosis not present

## 2021-11-17 DIAGNOSIS — I2510 Atherosclerotic heart disease of native coronary artery without angina pectoris: Secondary | ICD-10-CM | POA: Diagnosis not present

## 2021-11-17 DIAGNOSIS — Z87891 Personal history of nicotine dependence: Secondary | ICD-10-CM | POA: Diagnosis not present

## 2021-11-17 DIAGNOSIS — I10 Essential (primary) hypertension: Secondary | ICD-10-CM | POA: Diagnosis not present

## 2021-11-17 DIAGNOSIS — Z01810 Encounter for preprocedural cardiovascular examination: Secondary | ICD-10-CM | POA: Diagnosis not present

## 2021-11-17 DIAGNOSIS — Z01818 Encounter for other preprocedural examination: Principal | ICD-10-CM

## 2021-11-18 DIAGNOSIS — N186 End stage renal disease: Secondary | ICD-10-CM | POA: Diagnosis not present

## 2021-11-18 DIAGNOSIS — D631 Anemia in chronic kidney disease: Secondary | ICD-10-CM | POA: Diagnosis not present

## 2021-11-18 DIAGNOSIS — Z992 Dependence on renal dialysis: Secondary | ICD-10-CM | POA: Diagnosis not present

## 2021-11-20 DIAGNOSIS — Z992 Dependence on renal dialysis: Secondary | ICD-10-CM | POA: Diagnosis not present

## 2021-11-20 DIAGNOSIS — N186 End stage renal disease: Secondary | ICD-10-CM | POA: Diagnosis not present

## 2021-11-21 DIAGNOSIS — Z992 Dependence on renal dialysis: Secondary | ICD-10-CM | POA: Diagnosis not present

## 2021-11-21 DIAGNOSIS — N186 End stage renal disease: Secondary | ICD-10-CM | POA: Diagnosis not present

## 2021-11-22 DIAGNOSIS — H903 Sensorineural hearing loss, bilateral: Secondary | ICD-10-CM | POA: Diagnosis not present

## 2021-11-23 DIAGNOSIS — Z992 Dependence on renal dialysis: Secondary | ICD-10-CM | POA: Diagnosis not present

## 2021-11-23 DIAGNOSIS — N186 End stage renal disease: Secondary | ICD-10-CM | POA: Diagnosis not present

## 2021-11-25 DIAGNOSIS — Z992 Dependence on renal dialysis: Secondary | ICD-10-CM | POA: Diagnosis not present

## 2021-11-25 DIAGNOSIS — N186 End stage renal disease: Secondary | ICD-10-CM | POA: Diagnosis not present

## 2021-11-28 DIAGNOSIS — N186 End stage renal disease: Secondary | ICD-10-CM | POA: Diagnosis not present

## 2021-11-28 DIAGNOSIS — Z992 Dependence on renal dialysis: Secondary | ICD-10-CM | POA: Diagnosis not present

## 2021-11-30 DIAGNOSIS — Z992 Dependence on renal dialysis: Secondary | ICD-10-CM | POA: Diagnosis not present

## 2021-11-30 DIAGNOSIS — N186 End stage renal disease: Secondary | ICD-10-CM | POA: Diagnosis not present

## 2021-12-01 ENCOUNTER — Ambulatory Visit
Admission: RE | Admit: 2021-12-01 | Discharge: 2021-12-01 | Disposition: A | Discharge status: Home / Self Care | Payer: Medicare HMO | Source: Ambulatory Visit | Attending: Internal Medicine | Admitting: Internal Medicine

## 2021-12-01 DIAGNOSIS — Z1231 Encounter for screening mammogram for malignant neoplasm of breast: Secondary | ICD-10-CM | POA: Insufficient documentation

## 2021-12-02 DIAGNOSIS — N186 End stage renal disease: Secondary | ICD-10-CM | POA: Diagnosis not present

## 2021-12-02 DIAGNOSIS — Z992 Dependence on renal dialysis: Secondary | ICD-10-CM | POA: Diagnosis not present

## 2021-12-05 DIAGNOSIS — N186 End stage renal disease: Secondary | ICD-10-CM | POA: Diagnosis not present

## 2021-12-05 DIAGNOSIS — Z992 Dependence on renal dialysis: Secondary | ICD-10-CM | POA: Diagnosis not present

## 2021-12-07 DIAGNOSIS — N186 End stage renal disease: Secondary | ICD-10-CM | POA: Diagnosis not present

## 2021-12-07 DIAGNOSIS — Z992 Dependence on renal dialysis: Secondary | ICD-10-CM | POA: Diagnosis not present

## 2021-12-09 DIAGNOSIS — Z992 Dependence on renal dialysis: Secondary | ICD-10-CM | POA: Diagnosis not present

## 2021-12-09 DIAGNOSIS — N186 End stage renal disease: Secondary | ICD-10-CM | POA: Diagnosis not present

## 2021-12-12 DIAGNOSIS — N186 End stage renal disease: Secondary | ICD-10-CM | POA: Diagnosis not present

## 2021-12-12 DIAGNOSIS — Z992 Dependence on renal dialysis: Secondary | ICD-10-CM | POA: Diagnosis not present

## 2021-12-14 DIAGNOSIS — Z992 Dependence on renal dialysis: Secondary | ICD-10-CM | POA: Diagnosis not present

## 2021-12-14 DIAGNOSIS — N186 End stage renal disease: Secondary | ICD-10-CM | POA: Diagnosis not present

## 2021-12-16 DIAGNOSIS — N186 End stage renal disease: Secondary | ICD-10-CM | POA: Diagnosis not present

## 2021-12-16 DIAGNOSIS — Z992 Dependence on renal dialysis: Secondary | ICD-10-CM | POA: Diagnosis not present

## 2021-12-19 DIAGNOSIS — Z992 Dependence on renal dialysis: Secondary | ICD-10-CM | POA: Diagnosis not present

## 2021-12-19 DIAGNOSIS — N186 End stage renal disease: Secondary | ICD-10-CM | POA: Diagnosis not present

## 2021-12-21 DIAGNOSIS — N186 End stage renal disease: Secondary | ICD-10-CM | POA: Diagnosis not present

## 2021-12-21 DIAGNOSIS — Z992 Dependence on renal dialysis: Secondary | ICD-10-CM | POA: Diagnosis not present

## 2021-12-23 DIAGNOSIS — N186 End stage renal disease: Secondary | ICD-10-CM | POA: Diagnosis not present

## 2021-12-23 DIAGNOSIS — Z992 Dependence on renal dialysis: Secondary | ICD-10-CM | POA: Diagnosis not present

## 2021-12-26 DIAGNOSIS — Z992 Dependence on renal dialysis: Secondary | ICD-10-CM | POA: Diagnosis not present

## 2021-12-26 DIAGNOSIS — N186 End stage renal disease: Secondary | ICD-10-CM | POA: Diagnosis not present

## 2021-12-28 DIAGNOSIS — N186 End stage renal disease: Secondary | ICD-10-CM | POA: Diagnosis not present

## 2021-12-28 DIAGNOSIS — Z992 Dependence on renal dialysis: Secondary | ICD-10-CM | POA: Diagnosis not present

## 2021-12-30 DIAGNOSIS — N186 End stage renal disease: Secondary | ICD-10-CM | POA: Diagnosis not present

## 2021-12-30 DIAGNOSIS — Z992 Dependence on renal dialysis: Secondary | ICD-10-CM | POA: Diagnosis not present

## 2022-01-02 DIAGNOSIS — Z992 Dependence on renal dialysis: Secondary | ICD-10-CM | POA: Diagnosis not present

## 2022-01-02 DIAGNOSIS — N186 End stage renal disease: Secondary | ICD-10-CM | POA: Diagnosis not present

## 2022-01-04 DIAGNOSIS — N186 End stage renal disease: Secondary | ICD-10-CM | POA: Diagnosis not present

## 2022-01-04 DIAGNOSIS — Z992 Dependence on renal dialysis: Secondary | ICD-10-CM | POA: Diagnosis not present

## 2022-01-06 DIAGNOSIS — N186 End stage renal disease: Secondary | ICD-10-CM | POA: Diagnosis not present

## 2022-01-06 DIAGNOSIS — Z992 Dependence on renal dialysis: Secondary | ICD-10-CM | POA: Diagnosis not present

## 2022-01-09 DIAGNOSIS — Z992 Dependence on renal dialysis: Secondary | ICD-10-CM | POA: Diagnosis not present

## 2022-01-09 DIAGNOSIS — N186 End stage renal disease: Secondary | ICD-10-CM | POA: Diagnosis not present

## 2022-01-11 DIAGNOSIS — Z992 Dependence on renal dialysis: Secondary | ICD-10-CM | POA: Diagnosis not present

## 2022-01-11 DIAGNOSIS — N186 End stage renal disease: Secondary | ICD-10-CM | POA: Diagnosis not present

## 2022-01-13 DIAGNOSIS — Z992 Dependence on renal dialysis: Secondary | ICD-10-CM | POA: Diagnosis not present

## 2022-01-13 DIAGNOSIS — N186 End stage renal disease: Secondary | ICD-10-CM | POA: Diagnosis not present

## 2022-01-16 DIAGNOSIS — Z992 Dependence on renal dialysis: Secondary | ICD-10-CM | POA: Diagnosis not present

## 2022-01-16 DIAGNOSIS — N186 End stage renal disease: Secondary | ICD-10-CM | POA: Diagnosis not present

## 2022-01-18 DIAGNOSIS — N186 End stage renal disease: Secondary | ICD-10-CM | POA: Diagnosis not present

## 2022-01-18 DIAGNOSIS — Z992 Dependence on renal dialysis: Secondary | ICD-10-CM | POA: Diagnosis not present

## 2022-01-20 DIAGNOSIS — Z992 Dependence on renal dialysis: Secondary | ICD-10-CM | POA: Diagnosis not present

## 2022-01-20 DIAGNOSIS — N186 End stage renal disease: Secondary | ICD-10-CM | POA: Diagnosis not present

## 2022-01-23 DIAGNOSIS — Z992 Dependence on renal dialysis: Secondary | ICD-10-CM | POA: Diagnosis not present

## 2022-01-23 DIAGNOSIS — N186 End stage renal disease: Secondary | ICD-10-CM | POA: Diagnosis not present

## 2022-01-23 DIAGNOSIS — N2581 Secondary hyperparathyroidism of renal origin: Secondary | ICD-10-CM | POA: Diagnosis not present

## 2022-01-25 DIAGNOSIS — N186 End stage renal disease: Secondary | ICD-10-CM | POA: Diagnosis not present

## 2022-01-25 DIAGNOSIS — N2581 Secondary hyperparathyroidism of renal origin: Secondary | ICD-10-CM | POA: Diagnosis not present

## 2022-01-25 DIAGNOSIS — Z992 Dependence on renal dialysis: Secondary | ICD-10-CM | POA: Diagnosis not present

## 2022-01-27 DIAGNOSIS — E1122 Type 2 diabetes mellitus with diabetic chronic kidney disease: Secondary | ICD-10-CM | POA: Diagnosis not present

## 2022-01-27 DIAGNOSIS — Z992 Dependence on renal dialysis: Secondary | ICD-10-CM | POA: Diagnosis not present

## 2022-01-27 DIAGNOSIS — N2581 Secondary hyperparathyroidism of renal origin: Secondary | ICD-10-CM | POA: Diagnosis not present

## 2022-01-27 DIAGNOSIS — N186 End stage renal disease: Secondary | ICD-10-CM | POA: Diagnosis not present

## 2022-01-30 DIAGNOSIS — Z992 Dependence on renal dialysis: Secondary | ICD-10-CM | POA: Diagnosis not present

## 2022-01-30 DIAGNOSIS — N2581 Secondary hyperparathyroidism of renal origin: Secondary | ICD-10-CM | POA: Diagnosis not present

## 2022-01-30 DIAGNOSIS — N186 End stage renal disease: Secondary | ICD-10-CM | POA: Diagnosis not present

## 2022-02-01 DIAGNOSIS — N2581 Secondary hyperparathyroidism of renal origin: Secondary | ICD-10-CM | POA: Diagnosis not present

## 2022-02-01 DIAGNOSIS — N186 End stage renal disease: Secondary | ICD-10-CM | POA: Diagnosis not present

## 2022-02-01 DIAGNOSIS — Z992 Dependence on renal dialysis: Secondary | ICD-10-CM | POA: Diagnosis not present

## 2022-02-03 DIAGNOSIS — N2581 Secondary hyperparathyroidism of renal origin: Secondary | ICD-10-CM | POA: Diagnosis not present

## 2022-02-03 DIAGNOSIS — N186 End stage renal disease: Secondary | ICD-10-CM | POA: Diagnosis not present

## 2022-02-03 DIAGNOSIS — Z992 Dependence on renal dialysis: Secondary | ICD-10-CM | POA: Diagnosis not present

## 2022-02-06 DIAGNOSIS — N2581 Secondary hyperparathyroidism of renal origin: Secondary | ICD-10-CM | POA: Diagnosis not present

## 2022-02-06 DIAGNOSIS — N186 End stage renal disease: Secondary | ICD-10-CM | POA: Diagnosis not present

## 2022-02-06 DIAGNOSIS — Z992 Dependence on renal dialysis: Secondary | ICD-10-CM | POA: Diagnosis not present

## 2022-02-08 DIAGNOSIS — Z992 Dependence on renal dialysis: Secondary | ICD-10-CM | POA: Diagnosis not present

## 2022-02-08 DIAGNOSIS — N186 End stage renal disease: Secondary | ICD-10-CM | POA: Diagnosis not present

## 2022-02-08 DIAGNOSIS — N2581 Secondary hyperparathyroidism of renal origin: Secondary | ICD-10-CM | POA: Diagnosis not present

## 2022-02-10 DIAGNOSIS — Z992 Dependence on renal dialysis: Secondary | ICD-10-CM | POA: Diagnosis not present

## 2022-02-10 DIAGNOSIS — N2581 Secondary hyperparathyroidism of renal origin: Secondary | ICD-10-CM | POA: Diagnosis not present

## 2022-02-10 DIAGNOSIS — N186 End stage renal disease: Secondary | ICD-10-CM | POA: Diagnosis not present

## 2022-02-10 DIAGNOSIS — Z01818 Encounter for other preprocedural examination: Principal | ICD-10-CM

## 2022-02-10 DIAGNOSIS — Z7682 Awaiting organ transplant status: Principal | ICD-10-CM

## 2022-02-10 DIAGNOSIS — Z01810 Encounter for preprocedural cardiovascular examination: Principal | ICD-10-CM

## 2022-02-10 DIAGNOSIS — Z114 Encounter for screening for human immunodeficiency virus [HIV]: Principal | ICD-10-CM

## 2022-02-10 DIAGNOSIS — Z1159 Encounter for screening for other viral diseases: Principal | ICD-10-CM

## 2022-02-13 DIAGNOSIS — N186 End stage renal disease: Secondary | ICD-10-CM | POA: Diagnosis not present

## 2022-02-13 DIAGNOSIS — N2581 Secondary hyperparathyroidism of renal origin: Secondary | ICD-10-CM | POA: Diagnosis not present

## 2022-02-13 DIAGNOSIS — Z992 Dependence on renal dialysis: Secondary | ICD-10-CM | POA: Diagnosis not present

## 2022-02-13 DIAGNOSIS — Z01818 Encounter for other preprocedural examination: Principal | ICD-10-CM

## 2022-02-13 DIAGNOSIS — R944 Abnormal results of kidney function studies: Principal | ICD-10-CM

## 2022-02-13 DIAGNOSIS — Z7682 Awaiting organ transplant status: Principal | ICD-10-CM

## 2022-02-15 DIAGNOSIS — N2581 Secondary hyperparathyroidism of renal origin: Secondary | ICD-10-CM | POA: Diagnosis not present

## 2022-02-15 DIAGNOSIS — N186 End stage renal disease: Secondary | ICD-10-CM | POA: Diagnosis not present

## 2022-02-15 DIAGNOSIS — Z992 Dependence on renal dialysis: Secondary | ICD-10-CM | POA: Diagnosis not present

## 2022-02-17 DIAGNOSIS — Z992 Dependence on renal dialysis: Secondary | ICD-10-CM | POA: Diagnosis not present

## 2022-02-17 DIAGNOSIS — N2581 Secondary hyperparathyroidism of renal origin: Secondary | ICD-10-CM | POA: Diagnosis not present

## 2022-02-17 DIAGNOSIS — N186 End stage renal disease: Secondary | ICD-10-CM | POA: Diagnosis not present

## 2022-02-20 DIAGNOSIS — N2581 Secondary hyperparathyroidism of renal origin: Secondary | ICD-10-CM | POA: Diagnosis not present

## 2022-02-20 DIAGNOSIS — Z992 Dependence on renal dialysis: Secondary | ICD-10-CM | POA: Diagnosis not present

## 2022-02-20 DIAGNOSIS — N186 End stage renal disease: Secondary | ICD-10-CM | POA: Diagnosis not present

## 2022-02-21 ENCOUNTER — Other Ambulatory Visit: Payer: Self-pay

## 2022-02-21 NOTE — Patient Outreach (Signed)
[facility]) Care Management  02/21/2022  Bethany Lee September 07, 1950 [id number]   Telephone Screen    Outreach call to patient to introduce [facility] services and assess care needs as part of benefit of PCP office and insurance plan.RN CM left HIPAA compliant voicemail message along with contact info.      Plan: RN CM will make outreach attempt to patient within 4 business days. RN CM will send unsuccessful outreach letter to patient.   Enzo Montgomery, RN,BSN,CCM [place] Management Telephonic Care Management Coordinator Direct Phone: [phone] Toll Free: [phone] Fax: [phone]

## 2022-02-22 DIAGNOSIS — N186 End stage renal disease: Secondary | ICD-10-CM | POA: Diagnosis not present

## 2022-02-22 DIAGNOSIS — N2581 Secondary hyperparathyroidism of renal origin: Secondary | ICD-10-CM | POA: Diagnosis not present

## 2022-02-22 DIAGNOSIS — Z992 Dependence on renal dialysis: Secondary | ICD-10-CM | POA: Diagnosis not present

## 2022-02-23 ENCOUNTER — Other Ambulatory Visit: Payer: Self-pay

## 2022-02-23 NOTE — Patient Outreach (Signed)
[facility]) Care Management  02/23/2022  Bethany Lee 06-08-1951 [id number]   Telephone Screen       Unsuccessful outreach call to patient to introduce [facility] services and assess care needs as part of benefit of PCP office and insurance plan.      Plan: RN CM will make outreach attempt to patient within 4 business days.   Enzo Montgomery, RN,BSN,CCM [place] Management Telephonic Care Management Coordinator Direct Phone: [phone] Toll Free: [phone] Fax: [phone]

## 2022-02-24 DIAGNOSIS — Z992 Dependence on renal dialysis: Secondary | ICD-10-CM | POA: Diagnosis not present

## 2022-02-24 DIAGNOSIS — N2581 Secondary hyperparathyroidism of renal origin: Secondary | ICD-10-CM | POA: Diagnosis not present

## 2022-02-24 DIAGNOSIS — N186 End stage renal disease: Secondary | ICD-10-CM | POA: Diagnosis not present

## 2022-02-27 DIAGNOSIS — N2581 Secondary hyperparathyroidism of renal origin: Secondary | ICD-10-CM | POA: Diagnosis not present

## 2022-02-27 DIAGNOSIS — Z992 Dependence on renal dialysis: Secondary | ICD-10-CM | POA: Diagnosis not present

## 2022-02-27 DIAGNOSIS — N186 End stage renal disease: Secondary | ICD-10-CM | POA: Diagnosis not present

## 2022-02-28 ENCOUNTER — Other Ambulatory Visit: Payer: Self-pay

## 2022-02-28 NOTE — Patient Outreach (Signed)
[facility]) Care Management  02/28/2022  Bethany Lee Jul 20, 1951 [id number]   Telephone Screen       Unsuccessful outreach call to patient to introduce [facility] services and assess care needs as part of benefit of PCP office and insurance plan. Spoke with patient who requested call be completed with spouse due to language barrier.   Main healthcare issue/concern today: Spouse shares that patient is in the process of getting kidney transplant. She is on the list and awaiting a donor.    Health Maintenance/Care Gaps Addressed & Education Provided:   -Last AWV: 10/27/2021   Conditions: Per chart review, patient has PMH that includes but not limited to HTN, ESRD(HD-M,W,F) and anemia.   Medications Reviewed Today     Reviewed by Hayden Pedro, RN (Registered Nurse) on 02/28/22 at [place] List Status: <None>   Medication Order Taking? Sig Documenting Provider Last Dose Status Informant  amLODipine (NORVASC) 10 MG tablet [id number]  Take 1 tablet (10 mg total) by mouth daily. Lorella Nimrod, MD  Active   aspirin EC 81 MG tablet [id number] No Take 1 tablet (81 mg total) by mouth daily. Start taking after NOV 15  Patient taking differently: Take 81 mg by mouth daily.   Murlean Iba, MD 12/05/2020 Unknown time Active Spouse/Significant Other  calcitRIOL (ROCALTROL) 0.5 MCG capsule [id number] No Take 0.5 mcg by mouth daily. [provider] 12/05/2020 Unknown time Active Spouse/Significant Other  calcium acetate (PHOSLO) 667 MG capsule [id number] No Take 2,001 mg by mouth 3 (three) times daily with meals. [provider] 12/05/2020 Unknown time Active Spouse/Significant Other  carvedilol (COREG) 25 MG tablet [id number] No Take 25 mg by mouth 2 (two) times daily with a meal. [provider] 12/05/2020 Unknown time Active Spouse/Significant Other  furosemide (LASIX) 80 MG tablet [id number] No Take 80 mg by mouth daily. [provider]  12/05/2020 Unknown time Active Spouse/Significant Other  hydrALAZINE (APRESOLINE) 100 MG tablet [id number]  Take 1 tablet (100 mg total) by mouth 2 (two) times daily. Lorella Nimrod, MD  Active   insulin aspart (NOVOLOG) 100 UNIT/ML FlexPen [id number] No Inject 4-8 Units into the skin 3 (three) times daily with meals. Sliding scale  Below 200 take 4 units 200-250=6 units  >250= 8 units [provider] 12/05/2020 Unknown time Active Spouse/Significant Other           Med Note Modena Nunnery, SAMANTHA T   Tue Dec 04, 2017  8:55 AM)    irbesartan (AVAPRO) 150 MG tablet [id number]  Take 1 tablet (150 mg total) by mouth daily. Lorella Nimrod, MD  Active   iron polysaccharides (NIFEREX) 150 MG capsule [id number] No Take 150 mg by mouth daily. [provider] 12/06/2020 Unknown time Active Spouse/Significant Other  levothyroxine (SYNTHROID, LEVOTHROID) 112 MCG tablet [id number] No Take 112 mcg by mouth daily before breakfast.  [provider] 12/06/2020 Unknown time Active Spouse/Significant Other           Med Note Modena Nunnery, SAMANTHA T   Tue Dec 04, 2017  8:55 AM)    Lidocaine, Anorectal, 5 % CREA [id number]  Apply 1 application topically as needed. [provider]  Active Spouse/Significant Other  lovastatin (MEVACOR) 40 MG tablet [id number] No Take 40 mg by mouth at bedtime. [provider] 12/05/2020 Unknown time Active Spouse/Significant Other  Multiple Vitamins-Minerals (DIALYVITE 800/ULTRA D) TABS [id number] No Take 1 tablet by mouth daily. [provider] 12/05/2020 Unknown time Active Spouse/Significant Other  oxyCODONE (OXY  IR/ROXICODONE) 5 MG immediate release tablet [id number] No Take 5 mg by mouth every 6 (six) hours as needed. [provider] 12/05/2020 Unknown time Active Spouse/Significant Other  pantoprazole (PROTONIX) 40 MG tablet [id number] No Take 1 tablet (40 mg total) by mouth 2 (two) times daily.  Patient taking differently: Take 40 mg by mouth  daily.   Max Sane, MD 12/05/2020 Unknown time Active Spouse/Significant Other  sevelamer carbonate (RENVELA) 800 MG tablet [id number] No Take 1,600 mg by mouth 3 (three) times daily with meals. [provider] 12/05/2020 Unknown time Active Spouse/Significant Other  TOUJEO SOLOSTAR 300 UNIT/ML SOPN [id number] No Inject 24 Units into the skin Nightly. [provider] 12/05/2020 Unknown time Active Spouse/Significant Other  urea (CARMOL) 20 % cream [id number] No Apply 1 application topically daily as needed (for dry skin). [provider] 04/09/2019 Unknown time Active Spouse/Significant Other  Med List Note Arby Barrette, CPhT 04/10/19 1617): [place]               12/06/2020    8:22 AM 12/06/2020    5:06 PM 12/06/2020    8:00 PM 12/07/2020   11:00 AM 02/28/2022   10:27 AM  Fall Risk  Falls in the past year?     0  Was there an injury with Fall?     0  Fall Risk Category Calculator     0  Fall Risk Category     Low  Patient Fall Risk Level Low fall risk High fall risk High fall risk High fall risk Low fall risk  Patient at Risk for Falls Due to     Medication side effect  Fall risk Follow up     Falls evaluation completed;Education provided    SDOH Screenings   Alcohol Screen: Not on file  Depression (PHQ2-9): Low Risk  (02/28/2022)   Depression (PHQ2-9)    PHQ-2 Score: 0  Financial Resource Strain: Low Risk  (04/06/2018)   Overall Financial Resource Strain (CARDIA)    Difficulty of Paying Living Expenses: Not hard at all  Food Insecurity: No Food Insecurity (02/28/2022)   Hunger Vital Sign    Worried About Running Out of Food in the Last Year: Never true    Ran Out of Food in the Last Year: Never true  Housing: Not on file  Physical Activity: Not on file  Social Connections: Unknown (04/06/2018)   Social Connection and Isolation Panel [NHANES]    Frequency of Communication with Friends and Family: Not on file    Frequency of Social Gatherings  with Friends and Family: Not on file    Attends Religious Services: Not on file    Active Member of Clubs or Organizations: Not on file    Attends Archivist Meetings: Not on file    Marital Status: Married  Stress: Not on file  Tobacco Use: Medium Risk (02/28/2022)   Patient History    Smoking Tobacco Use: Former    Smokeless Tobacco Use: Never    Passive Exposure: Not on file  Transportation Needs: No Transportation Needs (02/28/2022)   PRAPARE - Transportation    Lack of Transportation (Medical): No    Lack of Transportation (Non-Medical): No       02/28/2022   10:23 AM  Depression screen PHQ 2/9  Decreased Interest 0  Down, Depressed, Hopeless 0  PHQ - 2 Score 0      Care Plan : RN Care Manager POC  Updates made by Hayden Pedro,  RN since 02/28/2022 12:00 AM     Problem: Chronic Disease Mgmt of Chronic Condition-ESRD   Priority: High     Long-Range Goal: Development of POC for Mgmt of Chronic Condition-ESRD   Start Date: 02/28/2022  Expected End Date: 03/01/2023  Priority: High  Note:     Current Barriers:  Chronic Disease Management support and education needs related to ESRD   RNCM Clinical Goal(s):  Patient will verbalize understanding of plan for management of ESRD as evidenced by mgmt of chronic conditions continue to work with RN Care Manager to address care management and care coordination needs related to  ESRD as evidenced by adherence to CM Team Scheduled appointments through collaboration with RN Care manager, provider, and care team.   Interventions: 1:1 collaboration with primary care provider regarding development and update of comprehensive plan of care as evidenced by provider attestation and co-signature Inter-disciplinary care team collaboration (see longitudinal plan of care) Evaluation of current treatment plan related to  self management and patient's adherence to plan as established by provider    Chronic Kidney Disease  Interventions:  (Status:  New goal.) Long Term Goal Assessed the Guardian   understanding of chronic kidney disease    Evaluation of current treatment plan related to chronic kidney disease self management and patient's adherence to plan as established by provider      Discussed plans with patient for ongoing care management follow up and provided patient with direct contact information for care management team    Discussed kidney transplant process Last practice recorded BP readings:  BP Readings from Last 3 Encounters:  12/07/20 (!) 183/60  07/20/20 (!) 171/52  04/10/19 (!) 171/56  Most recent eGFR/CrCl: No results found for: "EGFR"  No components found for: "CRCL"  Patient Goals/Self-Care Activities: Take all medications as prescribed Attend all scheduled provider appointments Call provider office for new concerns or questions   Follow Up Plan:  Telephone follow up appointment with care management team member scheduled for:  within the month of Oct The patient has been provided with contact information for the care management team and has been advised to call with any health related questions or concerns.        Consent: [facility] services reviewed and discussed with caregiver. Verbal consent for services given.    Plan: RN CM discussed with caregiver next outreach within the month of Oct. Caregiver agrees to care plan and follow up. RN CM will send barriers letter and route encounter to PCP. RN CM will send welcome letter to patient.  Enzo Montgomery, RN,BSN,CCM [place] Management Telephonic Care Management Coordinator Direct Phone: [phone] Toll Free: [phone] Fax: [phone]

## 2022-03-01 DIAGNOSIS — N186 End stage renal disease: Secondary | ICD-10-CM | POA: Diagnosis not present

## 2022-03-01 DIAGNOSIS — Z992 Dependence on renal dialysis: Secondary | ICD-10-CM | POA: Diagnosis not present

## 2022-03-01 DIAGNOSIS — N2581 Secondary hyperparathyroidism of renal origin: Secondary | ICD-10-CM | POA: Diagnosis not present

## 2022-03-03 DIAGNOSIS — Z992 Dependence on renal dialysis: Secondary | ICD-10-CM | POA: Diagnosis not present

## 2022-03-03 DIAGNOSIS — N186 End stage renal disease: Secondary | ICD-10-CM | POA: Diagnosis not present

## 2022-03-03 DIAGNOSIS — N2581 Secondary hyperparathyroidism of renal origin: Secondary | ICD-10-CM | POA: Diagnosis not present

## 2022-03-06 DIAGNOSIS — Z794 Long term (current) use of insulin: Secondary | ICD-10-CM | POA: Diagnosis not present

## 2022-03-06 DIAGNOSIS — H9193 Unspecified hearing loss, bilateral: Secondary | ICD-10-CM | POA: Diagnosis not present

## 2022-03-06 DIAGNOSIS — Z955 Presence of coronary angioplasty implant and graft: Secondary | ICD-10-CM | POA: Diagnosis not present

## 2022-03-06 DIAGNOSIS — I2510 Atherosclerotic heart disease of native coronary artery without angina pectoris: Secondary | ICD-10-CM | POA: Diagnosis not present

## 2022-03-06 DIAGNOSIS — N2581 Secondary hyperparathyroidism of renal origin: Secondary | ICD-10-CM | POA: Diagnosis not present

## 2022-03-06 DIAGNOSIS — I10 Essential (primary) hypertension: Secondary | ICD-10-CM | POA: Diagnosis not present

## 2022-03-06 DIAGNOSIS — E039 Hypothyroidism, unspecified: Secondary | ICD-10-CM | POA: Diagnosis not present

## 2022-03-06 DIAGNOSIS — N186 End stage renal disease: Secondary | ICD-10-CM | POA: Diagnosis not present

## 2022-03-06 DIAGNOSIS — D696 Thrombocytopenia, unspecified: Secondary | ICD-10-CM | POA: Diagnosis not present

## 2022-03-06 DIAGNOSIS — Z992 Dependence on renal dialysis: Secondary | ICD-10-CM | POA: Diagnosis not present

## 2022-03-06 DIAGNOSIS — E1122 Type 2 diabetes mellitus with diabetic chronic kidney disease: Secondary | ICD-10-CM | POA: Diagnosis not present

## 2022-03-07 DIAGNOSIS — D631 Anemia in chronic kidney disease: Secondary | ICD-10-CM | POA: Diagnosis not present

## 2022-03-07 DIAGNOSIS — Z955 Presence of coronary angioplasty implant and graft: Secondary | ICD-10-CM | POA: Diagnosis not present

## 2022-03-07 DIAGNOSIS — Z0000 Encounter for general adult medical examination without abnormal findings: Secondary | ICD-10-CM | POA: Diagnosis not present

## 2022-03-07 DIAGNOSIS — I2510 Atherosclerotic heart disease of native coronary artery without angina pectoris: Secondary | ICD-10-CM | POA: Diagnosis not present

## 2022-03-07 DIAGNOSIS — E1142 Type 2 diabetes mellitus with diabetic polyneuropathy: Secondary | ICD-10-CM | POA: Diagnosis not present

## 2022-03-07 DIAGNOSIS — E875 Hyperkalemia: Secondary | ICD-10-CM | POA: Diagnosis not present

## 2022-03-07 DIAGNOSIS — E113591 Type 2 diabetes mellitus with proliferative diabetic retinopathy without macular edema, right eye: Secondary | ICD-10-CM | POA: Diagnosis not present

## 2022-03-07 DIAGNOSIS — N898 Other specified noninflammatory disorders of vagina: Secondary | ICD-10-CM | POA: Diagnosis not present

## 2022-03-07 DIAGNOSIS — E1122 Type 2 diabetes mellitus with diabetic chronic kidney disease: Secondary | ICD-10-CM | POA: Diagnosis not present

## 2022-03-07 DIAGNOSIS — E039 Hypothyroidism, unspecified: Secondary | ICD-10-CM | POA: Diagnosis not present

## 2022-03-08 DIAGNOSIS — N186 End stage renal disease: Secondary | ICD-10-CM | POA: Diagnosis not present

## 2022-03-08 DIAGNOSIS — N2581 Secondary hyperparathyroidism of renal origin: Secondary | ICD-10-CM | POA: Diagnosis not present

## 2022-03-08 DIAGNOSIS — Z992 Dependence on renal dialysis: Secondary | ICD-10-CM | POA: Diagnosis not present

## 2022-03-10 DIAGNOSIS — N2581 Secondary hyperparathyroidism of renal origin: Secondary | ICD-10-CM | POA: Diagnosis not present

## 2022-03-10 DIAGNOSIS — N186 End stage renal disease: Secondary | ICD-10-CM | POA: Diagnosis not present

## 2022-03-10 DIAGNOSIS — Z992 Dependence on renal dialysis: Secondary | ICD-10-CM | POA: Diagnosis not present

## 2022-03-13 DIAGNOSIS — N186 End stage renal disease: Secondary | ICD-10-CM | POA: Diagnosis not present

## 2022-03-13 DIAGNOSIS — N2581 Secondary hyperparathyroidism of renal origin: Secondary | ICD-10-CM | POA: Diagnosis not present

## 2022-03-13 DIAGNOSIS — Z992 Dependence on renal dialysis: Secondary | ICD-10-CM | POA: Diagnosis not present

## 2022-03-15 DIAGNOSIS — N186 End stage renal disease: Secondary | ICD-10-CM | POA: Diagnosis not present

## 2022-03-15 DIAGNOSIS — N2581 Secondary hyperparathyroidism of renal origin: Secondary | ICD-10-CM | POA: Diagnosis not present

## 2022-03-15 DIAGNOSIS — Z992 Dependence on renal dialysis: Secondary | ICD-10-CM | POA: Diagnosis not present

## 2022-03-17 DIAGNOSIS — Z992 Dependence on renal dialysis: Secondary | ICD-10-CM | POA: Diagnosis not present

## 2022-03-17 DIAGNOSIS — N186 End stage renal disease: Secondary | ICD-10-CM | POA: Diagnosis not present

## 2022-03-17 DIAGNOSIS — N2581 Secondary hyperparathyroidism of renal origin: Secondary | ICD-10-CM | POA: Diagnosis not present

## 2022-03-20 DIAGNOSIS — Z992 Dependence on renal dialysis: Secondary | ICD-10-CM | POA: Diagnosis not present

## 2022-03-20 DIAGNOSIS — N186 End stage renal disease: Secondary | ICD-10-CM | POA: Diagnosis not present

## 2022-03-20 DIAGNOSIS — N2581 Secondary hyperparathyroidism of renal origin: Secondary | ICD-10-CM | POA: Diagnosis not present

## 2022-03-22 DIAGNOSIS — N186 End stage renal disease: Secondary | ICD-10-CM | POA: Diagnosis not present

## 2022-03-22 DIAGNOSIS — Z992 Dependence on renal dialysis: Secondary | ICD-10-CM | POA: Diagnosis not present

## 2022-03-22 DIAGNOSIS — N2581 Secondary hyperparathyroidism of renal origin: Secondary | ICD-10-CM | POA: Diagnosis not present

## 2022-03-23 ENCOUNTER — Other Ambulatory Visit: Payer: Self-pay

## 2022-03-23 DIAGNOSIS — E875 Hyperkalemia: Secondary | ICD-10-CM | POA: Diagnosis not present

## 2022-03-23 DIAGNOSIS — Z0100 Encounter for examination of eyes and vision without abnormal findings: Secondary | ICD-10-CM | POA: Diagnosis not present

## 2022-03-23 DIAGNOSIS — Z992 Dependence on renal dialysis: Secondary | ICD-10-CM | POA: Diagnosis not present

## 2022-03-23 DIAGNOSIS — N186 End stage renal disease: Secondary | ICD-10-CM | POA: Diagnosis not present

## 2022-03-23 DIAGNOSIS — E113413 Type 2 diabetes mellitus with severe nonproliferative diabetic retinopathy with macular edema, bilateral: Secondary | ICD-10-CM | POA: Diagnosis not present

## 2022-03-23 NOTE — Patient Outreach (Signed)
[facility]) Care Management  03/23/2022  Bethany Lee March 19, 1951 [id number]   Case Transfer  Pt will be followed for care coordination by [place] Management. Assigned RN CM will outreach to patient.    Enzo Montgomery, RN,BSN,CCM [place] Management Telephonic Care Management Coordinator Direct Phone: [phone] Toll Free: [phone] Fax: [phone]

## 2022-03-24 DIAGNOSIS — N186 End stage renal disease: Secondary | ICD-10-CM | POA: Diagnosis not present

## 2022-03-24 DIAGNOSIS — Z992 Dependence on renal dialysis: Secondary | ICD-10-CM | POA: Diagnosis not present

## 2022-03-24 DIAGNOSIS — N2581 Secondary hyperparathyroidism of renal origin: Secondary | ICD-10-CM | POA: Diagnosis not present

## 2022-03-27 DIAGNOSIS — N186 End stage renal disease: Secondary | ICD-10-CM | POA: Diagnosis not present

## 2022-03-27 DIAGNOSIS — Z992 Dependence on renal dialysis: Secondary | ICD-10-CM | POA: Diagnosis not present

## 2022-03-27 DIAGNOSIS — N2581 Secondary hyperparathyroidism of renal origin: Secondary | ICD-10-CM | POA: Diagnosis not present

## 2022-03-29 DIAGNOSIS — N2581 Secondary hyperparathyroidism of renal origin: Secondary | ICD-10-CM | POA: Diagnosis not present

## 2022-03-29 DIAGNOSIS — N186 End stage renal disease: Secondary | ICD-10-CM | POA: Diagnosis not present

## 2022-03-29 DIAGNOSIS — Z992 Dependence on renal dialysis: Secondary | ICD-10-CM | POA: Diagnosis not present

## 2022-03-31 DIAGNOSIS — N2581 Secondary hyperparathyroidism of renal origin: Secondary | ICD-10-CM | POA: Diagnosis not present

## 2022-03-31 DIAGNOSIS — N186 End stage renal disease: Secondary | ICD-10-CM | POA: Diagnosis not present

## 2022-03-31 DIAGNOSIS — Z992 Dependence on renal dialysis: Secondary | ICD-10-CM | POA: Diagnosis not present

## 2022-04-03 DIAGNOSIS — N186 End stage renal disease: Secondary | ICD-10-CM | POA: Diagnosis not present

## 2022-04-03 DIAGNOSIS — Z992 Dependence on renal dialysis: Secondary | ICD-10-CM | POA: Diagnosis not present

## 2022-04-03 DIAGNOSIS — N2581 Secondary hyperparathyroidism of renal origin: Secondary | ICD-10-CM | POA: Diagnosis not present

## 2022-04-05 DIAGNOSIS — N186 End stage renal disease: Secondary | ICD-10-CM | POA: Diagnosis not present

## 2022-04-05 DIAGNOSIS — Z992 Dependence on renal dialysis: Secondary | ICD-10-CM | POA: Diagnosis not present

## 2022-04-05 DIAGNOSIS — N2581 Secondary hyperparathyroidism of renal origin: Secondary | ICD-10-CM | POA: Diagnosis not present

## 2022-04-07 DIAGNOSIS — N186 End stage renal disease: Secondary | ICD-10-CM | POA: Diagnosis not present

## 2022-04-07 DIAGNOSIS — Z992 Dependence on renal dialysis: Secondary | ICD-10-CM | POA: Diagnosis not present

## 2022-04-07 DIAGNOSIS — N2581 Secondary hyperparathyroidism of renal origin: Secondary | ICD-10-CM | POA: Diagnosis not present

## 2022-04-10 DIAGNOSIS — N2581 Secondary hyperparathyroidism of renal origin: Secondary | ICD-10-CM | POA: Diagnosis not present

## 2022-04-10 DIAGNOSIS — N186 End stage renal disease: Secondary | ICD-10-CM | POA: Diagnosis not present

## 2022-04-10 DIAGNOSIS — Z992 Dependence on renal dialysis: Secondary | ICD-10-CM | POA: Diagnosis not present

## 2022-04-12 DIAGNOSIS — N2581 Secondary hyperparathyroidism of renal origin: Secondary | ICD-10-CM | POA: Diagnosis not present

## 2022-04-12 DIAGNOSIS — N186 End stage renal disease: Secondary | ICD-10-CM | POA: Diagnosis not present

## 2022-04-12 DIAGNOSIS — Z992 Dependence on renal dialysis: Secondary | ICD-10-CM | POA: Diagnosis not present

## 2022-04-14 DIAGNOSIS — N186 End stage renal disease: Secondary | ICD-10-CM | POA: Diagnosis not present

## 2022-04-14 DIAGNOSIS — N2581 Secondary hyperparathyroidism of renal origin: Secondary | ICD-10-CM | POA: Diagnosis not present

## 2022-04-14 DIAGNOSIS — Z992 Dependence on renal dialysis: Secondary | ICD-10-CM | POA: Diagnosis not present

## 2022-04-17 DIAGNOSIS — Z992 Dependence on renal dialysis: Secondary | ICD-10-CM | POA: Diagnosis not present

## 2022-04-17 DIAGNOSIS — N2581 Secondary hyperparathyroidism of renal origin: Secondary | ICD-10-CM | POA: Diagnosis not present

## 2022-04-17 DIAGNOSIS — N186 End stage renal disease: Secondary | ICD-10-CM | POA: Diagnosis not present

## 2022-04-19 DIAGNOSIS — N186 End stage renal disease: Secondary | ICD-10-CM | POA: Diagnosis not present

## 2022-04-19 DIAGNOSIS — Z992 Dependence on renal dialysis: Secondary | ICD-10-CM | POA: Diagnosis not present

## 2022-04-19 DIAGNOSIS — N2581 Secondary hyperparathyroidism of renal origin: Secondary | ICD-10-CM | POA: Diagnosis not present

## 2022-04-21 DIAGNOSIS — N186 End stage renal disease: Secondary | ICD-10-CM | POA: Diagnosis not present

## 2022-04-21 DIAGNOSIS — Z992 Dependence on renal dialysis: Secondary | ICD-10-CM | POA: Diagnosis not present

## 2022-04-21 DIAGNOSIS — N2581 Secondary hyperparathyroidism of renal origin: Secondary | ICD-10-CM | POA: Diagnosis not present

## 2022-04-22 DIAGNOSIS — N186 End stage renal disease: Secondary | ICD-10-CM | POA: Diagnosis not present

## 2022-04-22 DIAGNOSIS — Z992 Dependence on renal dialysis: Secondary | ICD-10-CM | POA: Diagnosis not present

## 2022-04-24 DIAGNOSIS — N2581 Secondary hyperparathyroidism of renal origin: Secondary | ICD-10-CM | POA: Diagnosis not present

## 2022-04-24 DIAGNOSIS — Z992 Dependence on renal dialysis: Secondary | ICD-10-CM | POA: Diagnosis not present

## 2022-04-24 DIAGNOSIS — N186 End stage renal disease: Secondary | ICD-10-CM | POA: Diagnosis not present

## 2022-04-26 DIAGNOSIS — N2581 Secondary hyperparathyroidism of renal origin: Secondary | ICD-10-CM | POA: Diagnosis not present

## 2022-04-26 DIAGNOSIS — N186 End stage renal disease: Secondary | ICD-10-CM | POA: Diagnosis not present

## 2022-04-26 DIAGNOSIS — Z992 Dependence on renal dialysis: Secondary | ICD-10-CM | POA: Diagnosis not present

## 2022-04-28 DIAGNOSIS — N186 End stage renal disease: Secondary | ICD-10-CM | POA: Diagnosis not present

## 2022-04-28 DIAGNOSIS — Z992 Dependence on renal dialysis: Secondary | ICD-10-CM | POA: Diagnosis not present

## 2022-04-28 DIAGNOSIS — N2581 Secondary hyperparathyroidism of renal origin: Secondary | ICD-10-CM | POA: Diagnosis not present

## 2022-05-01 DIAGNOSIS — E119 Type 2 diabetes mellitus without complications: Secondary | ICD-10-CM | POA: Diagnosis not present

## 2022-05-01 DIAGNOSIS — N186 End stage renal disease: Secondary | ICD-10-CM | POA: Diagnosis not present

## 2022-05-01 DIAGNOSIS — N2581 Secondary hyperparathyroidism of renal origin: Secondary | ICD-10-CM | POA: Diagnosis not present

## 2022-05-01 DIAGNOSIS — Z794 Long term (current) use of insulin: Secondary | ICD-10-CM | POA: Diagnosis not present

## 2022-05-01 DIAGNOSIS — Z992 Dependence on renal dialysis: Secondary | ICD-10-CM | POA: Diagnosis not present

## 2022-05-03 DIAGNOSIS — N2581 Secondary hyperparathyroidism of renal origin: Secondary | ICD-10-CM | POA: Diagnosis not present

## 2022-05-03 DIAGNOSIS — Z992 Dependence on renal dialysis: Secondary | ICD-10-CM | POA: Diagnosis not present

## 2022-05-03 DIAGNOSIS — N186 End stage renal disease: Secondary | ICD-10-CM | POA: Diagnosis not present

## 2022-05-05 DIAGNOSIS — Z992 Dependence on renal dialysis: Secondary | ICD-10-CM | POA: Diagnosis not present

## 2022-05-05 DIAGNOSIS — N2581 Secondary hyperparathyroidism of renal origin: Secondary | ICD-10-CM | POA: Diagnosis not present

## 2022-05-05 DIAGNOSIS — N186 End stage renal disease: Secondary | ICD-10-CM | POA: Diagnosis not present

## 2022-05-08 DIAGNOSIS — N2581 Secondary hyperparathyroidism of renal origin: Secondary | ICD-10-CM | POA: Diagnosis not present

## 2022-05-08 DIAGNOSIS — Z992 Dependence on renal dialysis: Secondary | ICD-10-CM | POA: Diagnosis not present

## 2022-05-08 DIAGNOSIS — N186 End stage renal disease: Secondary | ICD-10-CM | POA: Diagnosis not present

## 2022-05-10 DIAGNOSIS — Z992 Dependence on renal dialysis: Secondary | ICD-10-CM | POA: Diagnosis not present

## 2022-05-10 DIAGNOSIS — N186 End stage renal disease: Secondary | ICD-10-CM | POA: Diagnosis not present

## 2022-05-10 DIAGNOSIS — N2581 Secondary hyperparathyroidism of renal origin: Secondary | ICD-10-CM | POA: Diagnosis not present

## 2022-05-12 DIAGNOSIS — N2581 Secondary hyperparathyroidism of renal origin: Secondary | ICD-10-CM | POA: Diagnosis not present

## 2022-05-12 DIAGNOSIS — Z992 Dependence on renal dialysis: Secondary | ICD-10-CM | POA: Diagnosis not present

## 2022-05-12 DIAGNOSIS — N186 End stage renal disease: Secondary | ICD-10-CM | POA: Diagnosis not present

## 2022-05-15 DIAGNOSIS — Z992 Dependence on renal dialysis: Secondary | ICD-10-CM | POA: Diagnosis not present

## 2022-05-15 DIAGNOSIS — N2581 Secondary hyperparathyroidism of renal origin: Secondary | ICD-10-CM | POA: Diagnosis not present

## 2022-05-15 DIAGNOSIS — N186 End stage renal disease: Secondary | ICD-10-CM | POA: Diagnosis not present

## 2022-05-17 DIAGNOSIS — N186 End stage renal disease: Secondary | ICD-10-CM | POA: Diagnosis not present

## 2022-05-17 DIAGNOSIS — Z992 Dependence on renal dialysis: Secondary | ICD-10-CM | POA: Diagnosis not present

## 2022-05-17 DIAGNOSIS — N2581 Secondary hyperparathyroidism of renal origin: Secondary | ICD-10-CM | POA: Diagnosis not present

## 2022-05-19 DIAGNOSIS — N2581 Secondary hyperparathyroidism of renal origin: Secondary | ICD-10-CM | POA: Diagnosis not present

## 2022-05-19 DIAGNOSIS — Z992 Dependence on renal dialysis: Secondary | ICD-10-CM | POA: Diagnosis not present

## 2022-05-19 DIAGNOSIS — N186 End stage renal disease: Secondary | ICD-10-CM | POA: Diagnosis not present

## 2022-05-22 DIAGNOSIS — N186 End stage renal disease: Secondary | ICD-10-CM | POA: Diagnosis not present

## 2022-05-22 DIAGNOSIS — Z992 Dependence on renal dialysis: Secondary | ICD-10-CM | POA: Diagnosis not present

## 2022-05-22 DIAGNOSIS — N2581 Secondary hyperparathyroidism of renal origin: Secondary | ICD-10-CM | POA: Diagnosis not present

## 2022-05-23 DIAGNOSIS — Z992 Dependence on renal dialysis: Secondary | ICD-10-CM | POA: Diagnosis not present

## 2022-05-23 DIAGNOSIS — N186 End stage renal disease: Secondary | ICD-10-CM | POA: Diagnosis not present

## 2022-05-24 DIAGNOSIS — Z992 Dependence on renal dialysis: Secondary | ICD-10-CM | POA: Diagnosis not present

## 2022-05-24 DIAGNOSIS — N186 End stage renal disease: Secondary | ICD-10-CM | POA: Diagnosis not present

## 2022-05-26 DIAGNOSIS — Z992 Dependence on renal dialysis: Secondary | ICD-10-CM | POA: Diagnosis not present

## 2022-05-26 DIAGNOSIS — N186 End stage renal disease: Secondary | ICD-10-CM | POA: Diagnosis not present

## 2022-05-29 DIAGNOSIS — Z992 Dependence on renal dialysis: Secondary | ICD-10-CM | POA: Diagnosis not present

## 2022-05-29 DIAGNOSIS — N186 End stage renal disease: Secondary | ICD-10-CM | POA: Diagnosis not present

## 2022-05-31 DIAGNOSIS — Z992 Dependence on renal dialysis: Secondary | ICD-10-CM | POA: Diagnosis not present

## 2022-05-31 DIAGNOSIS — N186 End stage renal disease: Secondary | ICD-10-CM | POA: Diagnosis not present

## 2022-06-03 DIAGNOSIS — Z992 Dependence on renal dialysis: Secondary | ICD-10-CM | POA: Diagnosis not present

## 2022-06-03 DIAGNOSIS — N186 End stage renal disease: Secondary | ICD-10-CM | POA: Diagnosis not present

## 2022-06-05 DIAGNOSIS — Z992 Dependence on renal dialysis: Secondary | ICD-10-CM | POA: Diagnosis not present

## 2022-06-05 DIAGNOSIS — N186 End stage renal disease: Secondary | ICD-10-CM | POA: Diagnosis not present

## 2022-06-06 ENCOUNTER — Encounter: Admit: 2022-06-06 | Discharge: 2022-06-06 | Payer: MEDICARE | Attending: Surgery | Primary: Surgery

## 2022-06-06 ENCOUNTER — Ambulatory Visit: Admit: 2022-06-06 | Discharge: 2022-06-07 | Payer: MEDICARE | Attending: Family | Primary: Family

## 2022-06-06 ENCOUNTER — Ambulatory Visit: Admit: 2022-06-06 | Discharge: 2022-06-06 | Payer: MEDICARE

## 2022-06-06 ENCOUNTER — Ambulatory Visit: Admit: 2022-06-06 | Payer: MEDICARE

## 2022-06-06 ENCOUNTER — Ambulatory Visit: Admit: 2022-06-06 | Discharge: 2022-06-08 | Payer: MEDICARE

## 2022-06-07 DIAGNOSIS — N186 End stage renal disease: Secondary | ICD-10-CM | POA: Diagnosis not present

## 2022-06-07 DIAGNOSIS — Z992 Dependence on renal dialysis: Secondary | ICD-10-CM | POA: Diagnosis not present

## 2022-06-09 DIAGNOSIS — N186 End stage renal disease: Secondary | ICD-10-CM | POA: Diagnosis not present

## 2022-06-09 DIAGNOSIS — Z992 Dependence on renal dialysis: Secondary | ICD-10-CM | POA: Diagnosis not present

## 2022-06-12 DIAGNOSIS — N186 End stage renal disease: Secondary | ICD-10-CM | POA: Diagnosis not present

## 2022-06-12 DIAGNOSIS — Z992 Dependence on renal dialysis: Secondary | ICD-10-CM | POA: Diagnosis not present

## 2022-06-14 DIAGNOSIS — N186 End stage renal disease: Secondary | ICD-10-CM | POA: Diagnosis not present

## 2022-06-14 DIAGNOSIS — Z992 Dependence on renal dialysis: Secondary | ICD-10-CM | POA: Diagnosis not present

## 2022-06-16 DIAGNOSIS — Z992 Dependence on renal dialysis: Secondary | ICD-10-CM | POA: Diagnosis not present

## 2022-06-16 DIAGNOSIS — N186 End stage renal disease: Secondary | ICD-10-CM | POA: Diagnosis not present

## 2022-06-19 DIAGNOSIS — Z992 Dependence on renal dialysis: Secondary | ICD-10-CM | POA: Diagnosis not present

## 2022-06-19 DIAGNOSIS — N186 End stage renal disease: Secondary | ICD-10-CM | POA: Diagnosis not present

## 2022-06-21 ENCOUNTER — Encounter: Admit: 2022-06-21 | Discharge: 2022-06-21 | Payer: MEDICARE | Attending: Surgery | Primary: Surgery

## 2022-06-21 DIAGNOSIS — N186 End stage renal disease: Secondary | ICD-10-CM | POA: Diagnosis not present

## 2022-06-21 DIAGNOSIS — Z992 Dependence on renal dialysis: Secondary | ICD-10-CM | POA: Diagnosis not present

## 2022-06-21 DIAGNOSIS — Z01818 Encounter for other preprocedural examination: Principal | ICD-10-CM

## 2022-06-21 DIAGNOSIS — Z7682 Awaiting organ transplant status: Principal | ICD-10-CM

## 2022-06-22 ENCOUNTER — Ambulatory Visit: Payer: Self-pay | Admitting: *Deleted

## 2022-06-22 ENCOUNTER — Encounter: Payer: Self-pay | Admitting: *Deleted

## 2022-06-22 DIAGNOSIS — Z992 Dependence on renal dialysis: Secondary | ICD-10-CM | POA: Diagnosis not present

## 2022-06-22 DIAGNOSIS — N186 End stage renal disease: Secondary | ICD-10-CM | POA: Diagnosis not present

## 2022-06-22 NOTE — Patient Outreach (Signed)
  Care Coordination   06/22/2022 Name: Shariya Gaster MRN: [id number] DOB: [birth year]   Care Coordination Outreach Attempts:  An unsuccessful telephone outreach was attempted for a scheduled appointment today.  Follow Up Plan:  Additional outreach attempts will be made to offer the patient care coordination information and services.   Encounter Outcome:  No Answer   Care Coordination Interventions:  No, not indicated    Valente David, RN, MSN, [facility] Care Management Care Management Coordinator [phone]

## 2022-06-23 DIAGNOSIS — N186 End stage renal disease: Secondary | ICD-10-CM | POA: Diagnosis not present

## 2022-06-23 DIAGNOSIS — Z992 Dependence on renal dialysis: Secondary | ICD-10-CM | POA: Diagnosis not present

## 2022-06-23 DIAGNOSIS — N2581 Secondary hyperparathyroidism of renal origin: Secondary | ICD-10-CM | POA: Diagnosis not present

## 2022-06-26 ENCOUNTER — Telehealth: Payer: Self-pay | Admitting: *Deleted

## 2022-06-26 DIAGNOSIS — N2581 Secondary hyperparathyroidism of renal origin: Secondary | ICD-10-CM | POA: Diagnosis not present

## 2022-06-26 DIAGNOSIS — Z992 Dependence on renal dialysis: Secondary | ICD-10-CM | POA: Diagnosis not present

## 2022-06-26 DIAGNOSIS — N186 End stage renal disease: Secondary | ICD-10-CM | POA: Diagnosis not present

## 2022-06-26 NOTE — Progress Notes (Signed)
  Care Coordination Note  06/26/2022 Name: Dejia Ebron MRN: [id number] DOB: [birth year]  Armando Gang Brionne Mertz is a 71 y.o. year old female who is a primary care patient of Tracie Harrier, MD and is actively engaged with the care management team. I reached out to [facility] by phone today to assist with re-scheduling a follow up visit with the RN Case Manager  Follow up plan: Unsuccessful telephone outreach attempt made. A HIPAA compliant phone message was left for the patient providing contact information and requesting a return call.   Julian Hy, [place] Direct Dial: [phone]

## 2022-06-27 ENCOUNTER — Ambulatory Visit: Admit: 2022-06-27 | Discharge: 2022-06-28 | Payer: MEDICARE

## 2022-06-27 DIAGNOSIS — N186 End stage renal disease: Principal | ICD-10-CM

## 2022-06-27 DIAGNOSIS — Z7682 Awaiting organ transplant status: Principal | ICD-10-CM

## 2022-06-27 DIAGNOSIS — Z01818 Encounter for other preprocedural examination: Principal | ICD-10-CM

## 2022-06-27 DIAGNOSIS — Z992 Dependence on renal dialysis: Principal | ICD-10-CM

## 2022-06-27 DIAGNOSIS — Z01810 Encounter for preprocedural cardiovascular examination: Principal | ICD-10-CM

## 2022-06-28 DIAGNOSIS — Z992 Dependence on renal dialysis: Secondary | ICD-10-CM | POA: Diagnosis not present

## 2022-06-28 DIAGNOSIS — N2581 Secondary hyperparathyroidism of renal origin: Secondary | ICD-10-CM | POA: Diagnosis not present

## 2022-06-28 DIAGNOSIS — N186 End stage renal disease: Secondary | ICD-10-CM | POA: Diagnosis not present

## 2022-06-30 DIAGNOSIS — N186 End stage renal disease: Secondary | ICD-10-CM | POA: Diagnosis not present

## 2022-06-30 DIAGNOSIS — Z992 Dependence on renal dialysis: Secondary | ICD-10-CM | POA: Diagnosis not present

## 2022-06-30 DIAGNOSIS — N2581 Secondary hyperparathyroidism of renal origin: Secondary | ICD-10-CM | POA: Diagnosis not present

## 2022-07-03 DIAGNOSIS — Z992 Dependence on renal dialysis: Secondary | ICD-10-CM | POA: Diagnosis not present

## 2022-07-03 DIAGNOSIS — N2581 Secondary hyperparathyroidism of renal origin: Secondary | ICD-10-CM | POA: Diagnosis not present

## 2022-07-03 DIAGNOSIS — N186 End stage renal disease: Secondary | ICD-10-CM | POA: Diagnosis not present

## 2022-07-05 DIAGNOSIS — N2581 Secondary hyperparathyroidism of renal origin: Secondary | ICD-10-CM | POA: Diagnosis not present

## 2022-07-05 DIAGNOSIS — Z992 Dependence on renal dialysis: Secondary | ICD-10-CM | POA: Diagnosis not present

## 2022-07-05 DIAGNOSIS — N186 End stage renal disease: Secondary | ICD-10-CM | POA: Diagnosis not present

## 2022-07-07 DIAGNOSIS — Z992 Dependence on renal dialysis: Secondary | ICD-10-CM | POA: Diagnosis not present

## 2022-07-07 DIAGNOSIS — N2581 Secondary hyperparathyroidism of renal origin: Secondary | ICD-10-CM | POA: Diagnosis not present

## 2022-07-07 DIAGNOSIS — N186 End stage renal disease: Secondary | ICD-10-CM | POA: Diagnosis not present

## 2022-07-10 DIAGNOSIS — N2581 Secondary hyperparathyroidism of renal origin: Secondary | ICD-10-CM | POA: Diagnosis not present

## 2022-07-10 DIAGNOSIS — Z992 Dependence on renal dialysis: Secondary | ICD-10-CM | POA: Diagnosis not present

## 2022-07-10 DIAGNOSIS — N186 End stage renal disease: Secondary | ICD-10-CM | POA: Diagnosis not present

## 2022-07-12 DIAGNOSIS — N2581 Secondary hyperparathyroidism of renal origin: Secondary | ICD-10-CM | POA: Diagnosis not present

## 2022-07-12 DIAGNOSIS — N186 End stage renal disease: Secondary | ICD-10-CM | POA: Diagnosis not present

## 2022-07-12 DIAGNOSIS — Z992 Dependence on renal dialysis: Secondary | ICD-10-CM | POA: Diagnosis not present

## 2022-07-14 DIAGNOSIS — Z992 Dependence on renal dialysis: Secondary | ICD-10-CM | POA: Diagnosis not present

## 2022-07-14 DIAGNOSIS — N2581 Secondary hyperparathyroidism of renal origin: Secondary | ICD-10-CM | POA: Diagnosis not present

## 2022-07-14 DIAGNOSIS — N186 End stage renal disease: Secondary | ICD-10-CM | POA: Diagnosis not present

## 2022-07-16 DIAGNOSIS — N186 End stage renal disease: Secondary | ICD-10-CM | POA: Diagnosis not present

## 2022-07-16 DIAGNOSIS — N2581 Secondary hyperparathyroidism of renal origin: Secondary | ICD-10-CM | POA: Diagnosis not present

## 2022-07-16 DIAGNOSIS — Z992 Dependence on renal dialysis: Secondary | ICD-10-CM | POA: Diagnosis not present

## 2022-07-19 DIAGNOSIS — N2581 Secondary hyperparathyroidism of renal origin: Secondary | ICD-10-CM | POA: Diagnosis not present

## 2022-07-19 DIAGNOSIS — N186 End stage renal disease: Secondary | ICD-10-CM | POA: Diagnosis not present

## 2022-07-19 DIAGNOSIS — Z992 Dependence on renal dialysis: Secondary | ICD-10-CM | POA: Diagnosis not present

## 2022-07-20 NOTE — Progress Notes (Signed)
  Care Coordination Note  07/20/2022 Name: Bethany Lee MRN: [id number] DOB: 1951-01-09  Armando Gang [name] is a 71 y.o. year old female who is a primary care patient of Tracie Harrier, MD and is actively engaged with the care management team. I reached out to [facility] by phone today to assist with re-scheduling a follow up visit with the RN Case Manager  Follow up plan: Patient declines further follow up and engagement by the care management team. Appropriate care team members and provider have been notified via electronic communication.   Julian Hy, [place] Direct Dial: [phone]

## 2022-07-21 DIAGNOSIS — N2581 Secondary hyperparathyroidism of renal origin: Secondary | ICD-10-CM | POA: Diagnosis not present

## 2022-07-21 DIAGNOSIS — N186 End stage renal disease: Secondary | ICD-10-CM | POA: Diagnosis not present

## 2022-07-21 DIAGNOSIS — Z992 Dependence on renal dialysis: Secondary | ICD-10-CM | POA: Diagnosis not present

## 2022-07-23 ENCOUNTER — Inpatient Hospital Stay
Admission: EM | Admit: 2022-07-23 | Discharge: 2022-07-24 | DRG: 291 | Disposition: A | Discharge status: Home / Self Care | Payer: Medicare HMO | Attending: Pulmonary Disease | Admitting: Pulmonary Disease

## 2022-07-23 ENCOUNTER — Emergency Department: Payer: Medicare HMO

## 2022-07-23 ENCOUNTER — Other Ambulatory Visit: Payer: Self-pay

## 2022-07-23 DIAGNOSIS — N186 End stage renal disease: Secondary | ICD-10-CM | POA: Diagnosis not present

## 2022-07-23 DIAGNOSIS — Z992 Dependence on renal dialysis: Secondary | ICD-10-CM | POA: Diagnosis not present

## 2022-07-23 DIAGNOSIS — E039 Hypothyroidism, unspecified: Secondary | ICD-10-CM | POA: Diagnosis present

## 2022-07-23 DIAGNOSIS — E7800 Pure hypercholesterolemia, unspecified: Secondary | ICD-10-CM | POA: Diagnosis present

## 2022-07-23 DIAGNOSIS — N2581 Secondary hyperparathyroidism of renal origin: Secondary | ICD-10-CM | POA: Diagnosis not present

## 2022-07-23 DIAGNOSIS — Z955 Presence of coronary angioplasty implant and graft: Secondary | ICD-10-CM

## 2022-07-23 DIAGNOSIS — Z79899 Other long term (current) drug therapy: Secondary | ICD-10-CM

## 2022-07-23 DIAGNOSIS — I25110 Atherosclerotic heart disease of native coronary artery with unstable angina pectoris: Secondary | ICD-10-CM | POA: Diagnosis not present

## 2022-07-23 DIAGNOSIS — Z833 Family history of diabetes mellitus: Secondary | ICD-10-CM | POA: Diagnosis not present

## 2022-07-23 DIAGNOSIS — I132 Hypertensive heart and chronic kidney disease with heart failure and with stage 5 chronic kidney disease, or end stage renal disease: Principal | ICD-10-CM | POA: Diagnosis present

## 2022-07-23 DIAGNOSIS — R0789 Other chest pain: Secondary | ICD-10-CM | POA: Diagnosis not present

## 2022-07-23 DIAGNOSIS — G9341 Metabolic encephalopathy: Secondary | ICD-10-CM | POA: Diagnosis present

## 2022-07-23 DIAGNOSIS — I10 Essential (primary) hypertension: Secondary | ICD-10-CM | POA: Diagnosis not present

## 2022-07-23 DIAGNOSIS — R0902 Hypoxemia: Secondary | ICD-10-CM | POA: Diagnosis not present

## 2022-07-23 DIAGNOSIS — I120 Hypertensive chronic kidney disease with stage 5 chronic kidney disease or end stage renal disease: Secondary | ICD-10-CM | POA: Diagnosis not present

## 2022-07-23 DIAGNOSIS — Z1152 Encounter for screening for COVID-19: Secondary | ICD-10-CM | POA: Diagnosis not present

## 2022-07-23 DIAGNOSIS — Z90710 Acquired absence of both cervix and uterus: Secondary | ICD-10-CM

## 2022-07-23 DIAGNOSIS — Z87891 Personal history of nicotine dependence: Secondary | ICD-10-CM | POA: Diagnosis not present

## 2022-07-23 DIAGNOSIS — E1122 Type 2 diabetes mellitus with diabetic chronic kidney disease: Secondary | ICD-10-CM | POA: Diagnosis present

## 2022-07-23 DIAGNOSIS — I5023 Acute on chronic systolic (congestive) heart failure: Secondary | ICD-10-CM | POA: Diagnosis present

## 2022-07-23 DIAGNOSIS — I2510 Atherosclerotic heart disease of native coronary artery without angina pectoris: Secondary | ICD-10-CM | POA: Diagnosis present

## 2022-07-23 DIAGNOSIS — J9601 Acute respiratory failure with hypoxia: Secondary | ICD-10-CM | POA: Diagnosis present

## 2022-07-23 DIAGNOSIS — I5031 Acute diastolic (congestive) heart failure: Secondary | ICD-10-CM | POA: Diagnosis not present

## 2022-07-23 DIAGNOSIS — Z7982 Long term (current) use of aspirin: Secondary | ICD-10-CM | POA: Diagnosis not present

## 2022-07-23 DIAGNOSIS — J9600 Acute respiratory failure, unspecified whether with hypoxia or hypercapnia: Secondary | ICD-10-CM | POA: Diagnosis not present

## 2022-07-23 DIAGNOSIS — J810 Acute pulmonary edema: Secondary | ICD-10-CM | POA: Diagnosis not present

## 2022-07-23 DIAGNOSIS — D631 Anemia in chronic kidney disease: Secondary | ICD-10-CM | POA: Diagnosis not present

## 2022-07-23 DIAGNOSIS — R0603 Acute respiratory distress: Secondary | ICD-10-CM | POA: Diagnosis not present

## 2022-07-23 DIAGNOSIS — R079 Chest pain, unspecified: Secondary | ICD-10-CM | POA: Diagnosis not present

## 2022-07-23 DIAGNOSIS — R0689 Other abnormalities of breathing: Secondary | ICD-10-CM | POA: Diagnosis not present

## 2022-07-23 LAB — COMPREHENSIVE METABOLIC PANEL
ALT: 18 U/L (ref 0–44)
AST: 22 U/L (ref 15–41)
Albumin: 4.2 g/dL (ref 3.5–5.0)
Alkaline Phosphatase: 66 U/L (ref 38–126)
Anion gap: 13 (ref 5–15)
BUN: 55 mg/dL — ABNORMAL HIGH (ref 8–23)
CO2: 25 mmol/L (ref 22–32)
Calcium: 9 mg/dL (ref 8.9–10.3)
Chloride: 102 mmol/L (ref 98–111)
Creatinine, Ser: 7.44 mg/dL — ABNORMAL HIGH (ref 0.44–1.00)
GFR, Estimated: 5 mL/min — ABNORMAL LOW (ref >60)
Glucose, Bld: 192 mg/dL — ABNORMAL HIGH (ref 70–99)
Potassium: 4.7 mmol/L (ref 3.5–5.1)
Sodium: 140 mmol/L (ref 135–145)
Total Bilirubin: 0.7 mg/dL (ref 0.3–1.2)
Total Protein: 7.9 g/dL (ref 6.5–8.1)

## 2022-07-23 LAB — CREATININE, SERUM
Creatinine, Ser: 8.66 mg/dL — ABNORMAL HIGH (ref 0.44–1.00)
GFR, Estimated: 4 mL/min — ABNORMAL LOW (ref >60)

## 2022-07-23 LAB — CBC WITH DIFFERENTIAL/PLATELET
Abs Immature Granulocytes: 0.03 10*3/uL (ref 0.00–0.07)
Basophils Absolute: 0 10*3/uL (ref 0.0–0.1)
Basophils Relative: 0 %
Eosinophils Absolute: 0.1 10*3/uL (ref 0.0–0.5)
Eosinophils Relative: 1 %
HCT: 36.2 % (ref 36.0–46.0)
Hemoglobin: 12.3 g/dL (ref 12.0–15.0)
Immature Granulocytes: 0 %
Lymphocytes Relative: 14 %
Lymphs Abs: 1.2 10*3/uL (ref 0.7–4.0)
MCH: 32.4 pg (ref 26.0–34.0)
MCHC: 34 g/dL (ref 30.0–36.0)
MCV: 95.3 fL (ref 80.0–100.0)
Monocytes Absolute: 0.4 10*3/uL (ref 0.1–1.0)
Monocytes Relative: 4 %
Neutro Abs: 6.6 10*3/uL (ref 1.7–7.7)
Neutrophils Relative %: 81 %
Platelets: 101 10*3/uL — ABNORMAL LOW (ref 150–400)
RBC: 3.8 MIL/uL — ABNORMAL LOW (ref 3.87–5.11)
RDW: 14.4 % (ref 11.5–15.5)
WBC: 8.3 10*3/uL (ref 4.0–10.5)
nRBC: 0 % (ref 0.0–0.2)

## 2022-07-23 LAB — CBG MONITORING, ED
Glucose-Capillary: 158 mg/dL — ABNORMAL HIGH (ref 70–99)
Glucose-Capillary: 138 mg/dL — ABNORMAL HIGH (ref 70–99)
Glucose-Capillary: 133 mg/dL — ABNORMAL HIGH (ref 70–99)
Glucose-Capillary: 186 mg/dL — ABNORMAL HIGH (ref 70–99)

## 2022-07-23 LAB — CBC
HCT: 25.1 % — ABNORMAL LOW (ref 36.0–46.0)
Hemoglobin: 8.6 g/dL — ABNORMAL LOW (ref 12.0–15.0)
MCH: 32.5 pg (ref 26.0–34.0)
MCHC: 34.3 g/dL (ref 30.0–36.0)
MCV: 94.7 fL (ref 80.0–100.0)
Platelets: 90 10*3/uL — ABNORMAL LOW (ref 150–400)
RBC: 2.65 MIL/uL — ABNORMAL LOW (ref 3.87–5.11)
RDW: 14.4 % (ref 11.5–15.5)
WBC: 6.5 10*3/uL (ref 4.0–10.5)
nRBC: 0 % (ref 0.0–0.2)

## 2022-07-23 LAB — BRAIN NATRIURETIC PEPTIDE: B Natriuretic Peptide: 2217.1 pg/mL — ABNORMAL HIGH (ref 0.0–100.0)

## 2022-07-23 LAB — BLOOD GAS, ARTERIAL
Acid-Base Excess: 0.9 mmol/L (ref 0.0–2.0)
Bicarbonate: 23.8 mmol/L (ref 20.0–28.0)
Delivery systems: POSITIVE
O2 Saturation: 98.9 %
Patient temperature: 37
pCO2 arterial: 32 mmHg (ref 32–48)
pH, Arterial: 7.48 — ABNORMAL HIGH (ref 7.35–7.45)
pO2, Arterial: 181 mmHg — ABNORMAL HIGH (ref 83–108)

## 2022-07-23 LAB — RESP PANEL BY RT-PCR (RSV, FLU A&B, COVID)  RVPGX2
Influenza A by PCR: NEGATIVE
Influenza B by PCR: NEGATIVE
Resp Syncytial Virus by PCR: NEGATIVE
SARS Coronavirus 2 by RT PCR: NEGATIVE

## 2022-07-23 LAB — TROPONIN I (HIGH SENSITIVITY)
Troponin I (High Sensitivity): 24 ng/L — ABNORMAL HIGH (ref <18)
Troponin I (High Sensitivity): 38 ng/L — ABNORMAL HIGH (ref <18)

## 2022-07-23 LAB — HEPATITIS B SURFACE ANTIGEN: Hepatitis B Surface Ag: NONREACTIVE

## 2022-07-23 LAB — MAGNESIUM: Magnesium: 2.3 mg/dL (ref 1.7–2.4)

## 2022-07-23 MED ORDER — ASPIRIN 81 MG PO CHEW
324.0000 mg | CHEWABLE_TABLET | ORAL | Status: AC
  Filled 2022-07-23: qty 4

## 2022-07-23 MED ORDER — HEPARIN SODIUM (PORCINE) 5000 UNIT/ML IJ SOLN
5000.0000 [IU] | Freq: Three times a day (TID) | INTRAMUSCULAR | Status: DC
  Administered 2022-07-23: 5000 [IU] via SUBCUTANEOUS
  Filled 2022-07-23: qty 1

## 2022-07-23 MED ORDER — CLONIDINE HCL 0.1 MG PO TABS
0.1000 mg | ORAL_TABLET | Freq: Three times a day (TID) | ORAL | Status: DC
  Administered 2022-07-23 – 2022-07-24 (×3): 0.1 mg via ORAL
  Filled 2022-07-23 (×3): qty 1

## 2022-07-23 MED ORDER — HEPARIN SODIUM (PORCINE) 1000 UNIT/ML DIALYSIS: 1000.0000 [IU] | INTRAMUSCULAR | Status: DC | PRN

## 2022-07-23 MED ORDER — ASPIRIN 300 MG RE SUPP: 300.0000 mg | RECTAL | Status: AC

## 2022-07-23 MED ORDER — PENTAFLUOROPROP-TETRAFLUOROETH EX AERO: 1.0000 | INHALATION_SPRAY | CUTANEOUS | Status: DC | PRN

## 2022-07-23 MED ORDER — CARVEDILOL 6.25 MG PO TABS
12.5000 mg | ORAL_TABLET | Freq: Two times a day (BID) | ORAL | Status: DC
  Administered 2022-07-23 – 2022-07-24 (×2): 12.5 mg via ORAL
  Filled 2022-07-23 (×2): qty 2

## 2022-07-23 MED ORDER — HEPARIN SODIUM (PORCINE) 5000 UNIT/ML IJ SOLN
5000.0000 [IU] | Freq: Three times a day (TID) | INTRAMUSCULAR | Status: DC
  Administered 2022-07-23: 5000 [IU] via SUBCUTANEOUS
  Filled 2022-07-23 (×2): qty 1

## 2022-07-23 MED ORDER — ANTICOAGULANT SODIUM CITRATE 4% (200MG/5ML) IV SOLN: 5.0000 mL | Status: DC | PRN

## 2022-07-23 MED ORDER — NITROGLYCERIN IN D5W 200-5 MCG/ML-% IV SOLN
0.0000 ug/min | INTRAVENOUS | Status: DC
  Administered 2022-07-23: 50 ug/min via INTRAVENOUS
  Administered 2022-07-23: 175 ug/min via INTRAVENOUS
  Filled 2022-07-23 (×2): qty 250

## 2022-07-23 MED ORDER — INSULIN ASPART 100 UNIT/ML IJ SOLN
0.0000 [IU] | INTRAMUSCULAR | Status: DC
  Administered 2022-07-23: 2 [IU] via SUBCUTANEOUS
  Administered 2022-07-23: 1 [IU] via SUBCUTANEOUS
  Administered 2022-07-23: 2 [IU] via SUBCUTANEOUS
  Filled 2022-07-23 (×2): qty 1

## 2022-07-23 MED ORDER — LIDOCAINE-PRILOCAINE 2.5-2.5 % EX CREA: 1.0000 | TOPICAL_CREAM | CUTANEOUS | Status: DC | PRN

## 2022-07-23 MED ORDER — POLYETHYLENE GLYCOL 3350 17 G PO PACK: 17.0000 g | PACK | Freq: Every day | ORAL | Status: DC | PRN

## 2022-07-23 MED ORDER — LIDOCAINE HCL (PF) 1 % IJ SOLN: 5.0000 mL | INTRAMUSCULAR | Status: DC | PRN

## 2022-07-23 MED ORDER — ALTEPLASE 2 MG IJ SOLR: 2.0000 mg | Freq: Once | INTRAMUSCULAR | Status: DC | PRN

## 2022-07-23 MED ORDER — DOCUSATE SODIUM 100 MG PO CAPS: 100.0000 mg | ORAL_CAPSULE | Freq: Two times a day (BID) | ORAL | Status: DC | PRN

## 2022-07-23 MED ORDER — ACETAMINOPHEN 500 MG PO TABS
1000.0000 mg | ORAL_TABLET | Freq: Once | ORAL | Status: AC
  Administered 2022-07-23: 1000 mg via ORAL
  Filled 2022-07-23: qty 2

## 2022-07-23 MED ORDER — HYDRALAZINE HCL 20 MG/ML IJ SOLN
10.0000 mg | Freq: Four times a day (QID) | INTRAMUSCULAR | Status: DC | PRN
  Administered 2022-07-23 – 2022-07-24 (×2): 10 mg via INTRAVENOUS
  Filled 2022-07-23 (×2): qty 1

## 2022-07-23 MED ORDER — AMLODIPINE BESYLATE 5 MG PO TABS
10.0000 mg | ORAL_TABLET | Freq: Every day | ORAL | Status: DC
  Administered 2022-07-23 – 2022-07-24 (×2): 10 mg via ORAL
  Filled 2022-07-23 (×2): qty 2

## 2022-07-23 MED ORDER — CHLORHEXIDINE GLUCONATE CLOTH 2 % EX PADS
6.0000 | MEDICATED_PAD | Freq: Every day | CUTANEOUS | Status: DC
  Administered 2022-07-24: 6 via TOPICAL
  Filled 2022-07-23 (×2): qty 6

## 2022-07-23 NOTE — ED Notes (Signed)
Nitro gtt weened per MD Lanney Gins, pt c/o headache at this time. Po Tylenol given per MD. Pt on way to HD. On 3L [place]. Husband home to wait for placement after HD agreeable to plan.

## 2022-07-23 NOTE — ED Notes (Signed)
Talked with pharmacy, states ok to give po b/p meds together and start to ween Nitro gtt over 20 min. Goal is to be off of med before HD. MD Aleskerov agreeable with plan.

## 2022-07-23 NOTE — ED Notes (Signed)
1100 Pt's b/p remains elevated, MD Aleskreov to bedside, more meds ordered. Per HD unable to go on Nitro gtt. Plans to ween once sbp 180 in place. HD notified. Per MD Aleskerov when weening nitro gtt ok to to adjust 25 mcg/min every 15-20 min.

## 2022-07-23 NOTE — ED Notes (Signed)
Called Radiology for Xray status x 2

## 2022-07-23 NOTE — Progress Notes (Signed)
Unable to bring patient to [facility] until Nitro drip off. Primary RN currently weaning patient. Will bring for HD treatment when able. Sherlyn Hay, NP present and aware.

## 2022-07-23 NOTE — Progress Notes (Signed)
Machine 8 failed self test, replaced machine with machine 7 and placed a service order.

## 2022-07-23 NOTE — Consult Note (Signed)
[facility] Cardiology  CARDIOLOGY CONSULT NOTE  Patient ID: Bethany Lee MRN: [id number] DOB/AGE: 1951/01/02 71 y.o.  Admit date: 07/23/2022 Referring Physician Lanney Gins Primary Physician [facility] Primary Cardiologist  Reason for Consultation congestive heart failure  HPI: 71 year old female referred for evaluation of congestive heart failure.  Patient has known end-stage renal disease on chronic hemodialysis.  She presents with chief complaint of shortness of breath.  She was noted to be hypoxic with oxygen saturation of 85% on room air.  Upon arrival, blood pressure was elevated and the patient was started on nitroglycerin infusion.  Chest x-ray revealed evidence for pulmonary edema.  ECG revealed sinus rhythm without ischemic ST-T wave changes.  Patient denies chest pain.  She has known coronary disease, status post DES mid RCA 09/05/2016.  High-sensitivity troponin borderline elevated 24, 38.  2D echocardiogram at [facility] 06/06/2022 revealed normal left ventricular function, with LVEF greater than 55%, with mild aortic stenosis.  Review of systems complete and found to be negative unless listed above     Past Medical History:  Diagnosis Date   Anemia    CHF (congestive heart failure) (HCC)    Chronic kidney disease    peritoneal dialysis   Complication of anesthesia    Coronary artery disease    Diabetes mellitus without complication [facility])    Dialysis patient [facility])    Peritoneal dialysis patient   Dyspnea    doe   Edema    History of recent blood transfusion 01/2017   Hypercholesterolemia    Hypertension    Hypothyroidism    Neuropathy    PONV (postoperative nausea and vomiting)    Shingles    October-November 2017   Steal syndrome as complication of dialysis access [facility])    Tremors of nervous system    intermittent when taking gabapentin    Past Surgical History:  Procedure Laterality Date   A/V FISTULAGRAM Left 01/16/2017   Procedure: A/V Fistulagram;  Surgeon: Katha Cabal, MD;  Location: [place] CV LAB;  Service: Cardiovascular;  Laterality: Left;   A/V SHUNT INTERVENTION N/A 01/16/2017   Procedure: A/V Shunt Intervention;  Surgeon: Katha Cabal, MD;  Location: [place] CV LAB;  Service: Cardiovascular;  Laterality: N/A;   ABDOMINAL HYSTERECTOMY     AV FISTULA PLACEMENT Left 11/30/2016   Procedure: ARTERIOVENOUS (AV) FISTULA CREATION ( EXPLORE FOR CREATION BRACHIOCEPHALIC);  Surgeon: Algernon Huxley, MD;  Location: [facility] ORS;  Service: Vascular;  Laterality: Left;   CAPD INSERTION N/A 11/25/2015   Procedure: LAPAROSCOPIC INSERTION CONTINUOUS AMBULATORY PERITONEAL DIALYSIS  (CAPD) CATHETER;  Surgeon: Algernon Huxley, MD;  Location: [facility] ORS;  Service: Vascular;  Laterality: N/A;   CAPD INSERTION N/A 01/06/2016   Procedure: LAPAROSCOPIC INSERTION CONTINUOUS AMBULATORY PERITONEAL DIALYSIS  (CAPD) CATHETER REVISION ;  Surgeon: Algernon Huxley, MD;  Location: [facility] ORS;  Service: Vascular;  Laterality: N/A;   CAPD INSERTION N/A 10/11/2016   Procedure: LAPAROSCOPIC INSERTION CONTINUOUS AMBULATORY PERITONEAL DIALYSIS  (CAPD) CATHETER ( REVISION );  Surgeon: Algernon Huxley, MD;  Location: [facility] ORS;  Service: Vascular;  Laterality: N/A;   CARDIAC CATHETERIZATION     CATARACT EXTRACTION W/PHACO Right 05/25/2016   Procedure: CATARACT EXTRACTION PHACO AND INTRAOCULAR LENS PLACEMENT ([place]);  Surgeon: Eulogio Bear, MD;  Location: [facility] ORS;  Service: Ophthalmology;  Laterality: Right;  Lot # [id number] H [place]: 00:5.2 AP%: 11.3 CDE: 7.21   CATARACT EXTRACTION W/PHACO Left 12/06/2017   Procedure: CATARACT EXTRACTION PHACO AND INTRAOCULAR LENS PLACEMENT (I[facility]);  Surgeon: Eulogio Bear, MD;  Location: [facility] ORS;  Service: Ophthalmology;  Laterality: Left;  [place] 00.23.4 AP% 7.9 CDE 1.83 Fluid Pack Lot # [id number]   COLONOSCOPY WITH PROPOFOL N/A 02/15/2017   Procedure: COLONOSCOPY WITH PROPOFOL;  Surgeon: [name], MD;  Location: [facility] ENDOSCOPY;  Service: Gastroenterology;   Laterality: N/A;   CORONARY ANGIOPLASTY     STENT   CORONARY STENT INTERVENTION N/A 09/07/2016   Procedure: Coronary Stent Intervention;  Surgeon: Isaias Cowman, MD;  Location: [place] CV LAB;  Service: Cardiovascular;  Laterality: N/A;   DIALYSIS/PERMA CATHETER INSERTION N/A 09/21/2016   Procedure: Dialysis/Perma Catheter Insertion;  Surgeon: Algernon Huxley, MD;  Location: [place] CV LAB;  Service: Cardiovascular;  Laterality: N/A;   DIALYSIS/PERMA CATHETER REMOVAL Right 11/30/2016   Procedure: DIALYSIS/PERMA CATHETER REMOVAL;  Surgeon: Algernon Huxley, MD;  Location: [facility] ORS;  Service: Vascular;  Laterality: Right;   ESOPHAGOGASTRODUODENOSCOPY (EGD) WITH PROPOFOL N/A 02/15/2017   Procedure: ESOPHAGOGASTRODUODENOSCOPY (EGD) WITH PROPOFOL;  Surgeon: [name], MD;  Location: [facility] ENDOSCOPY;  Service: Gastroenterology;  Laterality: N/A;   EYE SURGERY     GIVENS CAPSULE STUDY N/A 02/26/2017   Procedure: GIVENS CAPSULE STUDY;  Surgeon: [name], MD;  Location: [facility] ENDOSCOPY;  Service: Gastroenterology;  Laterality: N/A;   INSERTION OF DIALYSIS CATHETER N/A 04/07/2018   Procedure: INSERTION OF DIALYSIS CATHETER;  Surgeon: Rosetta Posner, MD;  Location: [facility] OR;  Service: Vascular;  Laterality: N/A;   LEFT HEART CATH AND CORONARY ANGIOGRAPHY Left 09/07/2016   Procedure: Left Heart Cath and Coronary Angiography;  Surgeon: Isaias Cowman, MD;  Location: [place] CV LAB;  Service: Cardiovascular;  Laterality: Left;   LIGATION OF ARTERIOVENOUS  FISTULA Left 02/07/2017   Procedure: LIGATION OF ARTERIOVENOUS  FISTULA ( BANDING BRACHIAL CEPHALIC );  Surgeon: Katha Cabal, MD;  Location: [facility] ORS;  Service: Vascular;  Laterality: Left;   TUBAL LIGATION     UPPER EXTREMITY ANGIOGRAPHY Left 01/19/2017   Procedure: Upper Extremity Angiography;  Surgeon: Katha Cabal, MD;  Location: [place] CV LAB;  Service: Cardiovascular;  Laterality: Left;    (Not in a hospital  admission)  Social History   Socioeconomic History   Marital status: Married    Spouse name: Not on file   Number of children: Not on file   Years of education: Not on file   Highest education level: Not on file  Occupational History   Not on file  Tobacco Use   Smoking status: Former    Types: Cigarettes    Quit date: 10/09/2008    Years since quitting: 13.7   Smokeless tobacco: Never  Vaping Use   Vaping Use: Never used  Substance and Sexual Activity   Alcohol use: No   Drug use: No   Sexual activity: Yes  Other Topics Concern   Not on file  Social History Narrative   Not on file   Social Determinants of Health   Financial Resource Strain: Low Risk  (04/06/2018)   Overall Financial Resource Strain (CARDIA)    Difficulty of Paying Living Expenses: Not hard at all  Food Insecurity: No Food Insecurity (02/28/2022)   Hunger Vital Sign    Worried About Running Out of Food in the Last Year: Never true    [place] in the Last Year: Never true  Transportation Needs: No Transportation Needs (02/28/2022)   PRAPARE - Hydrologist (Medical): No    Lack of Transportation (  Non-Medical): No  Physical Activity: Not on file  Stress: Not on file  Social Connections: Unknown (04/06/2018)   Social Connection and Isolation Panel [NHANES]    Frequency of Communication with Friends and Family: Not on file    Frequency of Social Gatherings with Friends and Family: Not on file    Attends Religious Services: Not on file    Active Member of Clubs or Organizations: Not on file    Attends Archivist Meetings: Not on file    Marital Status: Married  Intimate Partner Violence: Not At Risk (04/06/2018)   Humiliation, Afraid, Rape, and Kick questionnaire    Fear of Current or Ex-Partner: No    Emotionally Abused: No    Physically Abused: No    Sexually Abused: No    Family History  Problem Relation Age of Onset   Diabetes Mother    Breast cancer Neg  Hx       Review of systems complete and found to be negative unless listed above      PHYSICAL EXAM  General: Well developed, well nourished, in no acute distress HEENT:  Normocephalic and atramatic Neck:  No JVD.  Lungs: Clear bilaterally to auscultation and percussion. Heart: HRRR . Normal S1 and S2 without gallops or murmurs.  Abdomen: Bowel sounds are positive, abdomen soft and non-tender  Msk:  Back normal, normal gait. Normal strength and tone for age. Extremities: No clubbing, cyanosis or edema.   Neuro: Alert and oriented X 3. Psych:  Good affect, responds appropriately  Labs:   Lab Results  Component Value Date   WBC 8.3 07/23/2022   HGB 12.3 07/23/2022   HCT 36.2 07/23/2022   MCV 95.3 07/23/2022   PLT 101 (L) 07/23/2022    Recent Labs  Lab 07/23/22 0446  NA 140  K 4.7  CL 102  CO2 25  BUN 55*  CREATININE 7.44*  CALCIUM 9.0  PROT 7.9  BILITOT 0.7  ALKPHOS 66  ALT 18  AST 22  GLUCOSE 192*   Lab Results  Component Value Date   TROPONINI <0.03 02/14/2017   No results found for: "CHOL" No results found for: "HDL" No results found for: "LDLCALC" No results found for: "TRIG" No results found for: "CHOLHDL" No results found for: "LDLDIRECT"    Radiology: DG Chest Portable 1 View  Result Date: 07/23/2022 CLINICAL DATA:  71 year old female with suspected volume overload. End stage renal disease and respiratory distress. EXAM: PORTABLE CHEST 1 VIEW COMPARISON:  Chest radiographs 12/06/2020 and earlier. FINDINGS: Portable AP semi upright view at 0539 hours. Stable lung volumes and mediastinal contours. Calcified aortic atherosclerosis. Borderline to mild cardiomegaly. Visualized tracheal air column is within normal limits. Chronic but increased interstitial markings diffusely, and trace new fluid in the right minor fissure. No definite layering pleural effusions. No pneumothorax or consolidation. Chronic left subclavian vascular stent. Paucity of bowel gas  in the visible abdomen. No acute osseous abnormality identified. IMPRESSION: 1. Acute on chronic pulmonary interstitial opacity and new fluid in the right minor fissure compatible with pulmonary interstitial edema. 2. Aortic Atherosclerosis (ICD10-I70.0). Electronically Signed   By: Genevie Ann M.D.   On: 07/23/2022 05:59    EKG: Sinus rhythm at 92 bpm  ASSESSMENT AND PLAN:   1.  Pulmonary edema, in patient with known end-stage renal disease on chronic hemodialysis, with recent 2D echocardiogram 06/06/2022 revealed normal left ventricular function, with mild aortic stenosis, awaiting hemodialysis today 2.  Coronary artery disease, status post DES mid  RCA 09/05/2016, without chest pain, with borderline elevated high-sensitivity troponin 24, 38, likely chronic elevation in the setting of end-stage renal disease 3.  Elevated blood pressure, on amlodipine, carvedilol, clonidine, hydralazine, and furosemide  Recommendations  1.  Agree with current therapy 2.  Proceed with hemodialysis 3.  Resume carvedilol 25 mg twice daily 4.  Resume amlodipine 10 mg daily 5.  No further cardiac diagnostics at this time  Signed: Isaias Cowman MD,PhD, [facility] 07/23/2022, 9:22 AM

## 2022-07-23 NOTE — Progress Notes (Signed)
Post hd rn assessment 

## 2022-07-23 NOTE — Consult Note (Signed)
[place] for Electrolyte Monitoring and Replacement   Recent Labs: Potassium (mmol/L)  Date Value  07/23/2022 4.7  01/06/2016 4.8   Magnesium (mg/dL)  Date Value  07/23/2022 2.3   Calcium (mg/dL)  Date Value  07/23/2022 9.0   Albumin (g/dL)  Date Value  07/23/2022 4.2   Phosphorus (mg/dL)  Date Value  04/09/2018 5.3 (H)   Sodium (mmol/L)  Date Value  07/23/2022 140   Assessment: Patient is a 71 y/o F with medical history including ESRD on HD MWF, HTN who is admitted with hypertensive pulmonary edema. Pharmacy consulted to assist with electrolyte monitoring and replacement as indicated.  Goal of Therapy:  Electrolytes within normal limits  Plan:  --No electrolyte replacement indicated at this time --Follow-up electrolytes as ordered by primary team  Bethany Lee 07/23/2022 9:04 AM

## 2022-07-23 NOTE — Progress Notes (Signed)
BFR decreased to 300 d/t multiple ap alarms. Pt alert, vss, no c/o.

## 2022-07-23 NOTE — H&P (Signed)
CRITICAL CARE     Name: Bethany Lee MRN: [id number] DOB: 1951-05-16     LOS: 0   SUBJECTIVE FINDINGS & SIGNIFICANT EVENTS    HPI:  This is a 71 yo F with hx of CKD/ESRD on HD MWF and essential HTN.  She came in due to shortness of breath.  She states she was unable to get her HD done due to holiday and at same time shares with me she has not missed any sessions so there may still be component of metabolic encephalopathy during interview.. She was noted to have hypoxemia on arrival in ED at 85%. She has well functioning left arm fistula.  She was noted to have accelerated HTN and placed on nitroglycerin drip.  She had chest imaging with findings of cardiomegaly, blunting of costo and cardiophrenic angles, cephalization , kerley b lines and interstitial edema without consolidation, pneumothorax/hemothorax or broken ribs. Her BNP is markedly elevated higher then all other previous measurements in past 6 years >2200. In 2017 she had TTE with EF 45% and she had 75% stenosis of distal RCA and had DES done in 2018 with [facility] cardio.   Lines/tubes :   Microbiology/Sepsis markers: Results for orders placed or performed during the hospital encounter of 12/06/20  Resp Panel by RT-PCR (Flu A&B, Covid) Nasopharyngeal Swab     Status: None   Collection Time: 12/06/20 10:36 AM   Specimen: Nasopharyngeal Swab; Nasopharyngeal(NP) swabs in vial transport medium  Result Value Ref Range Status   SARS Coronavirus 2 by RT PCR NEGATIVE NEGATIVE Final    Comment: (NOTE) SARS-CoV-2 target nucleic acids are NOT DETECTED.  The SARS-CoV-2 RNA is generally detectable in upper respiratory specimens during the acute phase of infection. The lowest concentration of SARS-CoV-2 viral copies this assay can detect is 138 copies/mL. A negative  result does not preclude SARS-Cov-2 infection and should not be used as the sole basis for treatment or other patient management decisions. A negative result may occur with  improper specimen collection/handling, submission of specimen other than nasopharyngeal swab, presence of viral mutation(s) within the areas targeted by this assay, and inadequate number of viral copies(<138 copies/mL). A negative result must be combined with clinical observations, patient history, and epidemiological information. The expected result is Negative.  Fact Sheet for Patients:  EntrepreneurPulse.com.au  Fact Sheet for Healthcare Providers:  IncredibleEmployment.be  This test is no t yet approved or cleared by the [place] FDA and  has been authorized for detection and/or diagnosis of SARS-CoV-2 by FDA under an Emergency Use Authorization (EUA). This EUA will remain  in effect (meaning this test can be used) for the duration of the COVID-19 declaration under Section 564(b)(1) of the Act, 21 U.S.C.section 360bbb-3(b)(1), unless the authorization is terminated  or revoked sooner.       Influenza A by PCR NEGATIVE NEGATIVE Final   Influenza B by PCR NEGATIVE NEGATIVE Final    Comment: (NOTE) The Xpert Xpress SARS-CoV-2/FLU/RSV plus assay is intended as an aid in the diagnosis of influenza from Nasopharyngeal swab specimens and should not be used as a sole basis for treatment. Nasal washings and aspirates are unacceptable for Xpert Xpress SARS-CoV-2/FLU/RSV testing.  Fact Sheet for Patients: EntrepreneurPulse.com.au  Fact Sheet for Healthcare Providers: IncredibleEmployment.be  This test is not yet approved or cleared by the [place] FDA and has been authorized for detection and/or diagnosis of SARS-CoV-2 by FDA under an Emergency Use Authorization (EUA). This EUA will remain in effect (meaning  this test can be used)  for the duration of the COVID-19 declaration under Section 564(b)(1) of the Act, 21 U.S.C. section 360bbb-3(b)(1), unless the authorization is terminated or revoked.  Performed at [facility], [address], [place], [place] [address]     Anti-infectives:  Anti-infectives (From admission, onward)    None        PAST MEDICAL HISTORY   Past Medical History:  Diagnosis Date   Anemia    CHF (congestive heart failure) (HCC)    Chronic kidney disease    peritoneal dialysis   Complication of anesthesia    Coronary artery disease    Diabetes mellitus without complication ([place])    Dialysis patient [facility])    Peritoneal dialysis patient   Dyspnea    doe   Edema    History of recent blood transfusion 01/2017   Hypercholesterolemia    Hypertension    Hypothyroidism    Neuropathy    PONV (postoperative nausea and vomiting)    Shingles    October-November 2017   Steal syndrome as complication of dialysis access [facility])    Tremors of nervous system    intermittent when taking gabapentin     SURGICAL HISTORY   Past Surgical History:  Procedure Laterality Date   A/V FISTULAGRAM Left 01/16/2017   Procedure: A/V Fistulagram;  Surgeon: Katha Cabal, MD;  Location: [place] [facility];  Service: Cardiovascular;  Laterality: Left;   A/V SHUNT INTERVENTION N/A 01/16/2017   Procedure: A/V Shunt Intervention;  Surgeon: Katha Cabal, MD;  Location: [place] [facility];  Service: Cardiovascular;  Laterality: N/A;   ABDOMINAL HYSTERECTOMY     AV FISTULA PLACEMENT Left 11/30/2016   Procedure: ARTERIOVENOUS (AV) FISTULA CREATION ( EXPLORE FOR CREATION BRACHIOCEPHALIC);  Surgeon: Algernon Huxley, MD;  Location: [facility] ORS;  Service: Vascular;  Laterality: Left;   CAPD INSERTION N/A 11/25/2015   Procedure: LAPAROSCOPIC INSERTION CONTINUOUS AMBULATORY PERITONEAL DIALYSIS  (CAPD) CATHETER;  Surgeon: Algernon Huxley, MD;  Location: [facility] ORS;  Service: Vascular;  Laterality: N/A;    CAPD INSERTION N/A 01/06/2016   Procedure: LAPAROSCOPIC INSERTION CONTINUOUS AMBULATORY PERITONEAL DIALYSIS  (CAPD) CATHETER REVISION ;  Surgeon: Algernon Huxley, MD;  Location: [facility] ORS;  Service: Vascular;  Laterality: N/A;   CAPD INSERTION N/A 10/11/2016   Procedure: LAPAROSCOPIC INSERTION CONTINUOUS AMBULATORY PERITONEAL DIALYSIS  (CAPD) CATHETER ( REVISION );  Surgeon: Algernon Huxley, MD;  Location: [facility] ORS;  Service: Vascular;  Laterality: N/A;   CARDIAC CATHETERIZATION     CATARACT EXTRACTION W/PHACO Right 05/25/2016   Procedure: CATARACT EXTRACTION PHACO AND INTRAOCULAR LENS PLACEMENT ([place]);  Surgeon: Eulogio Bear, MD;  Location: [facility] ORS;  Service: Ophthalmology;  Laterality: Right;  Lot # [id number] H [place]: 00:5.2 AP%: 11.3 CDE: 7.21   CATARACT EXTRACTION W/PHACO Left 12/06/2017   Procedure: CATARACT EXTRACTION PHACO AND INTRAOCULAR LENS PLACEMENT ([facility]);  Surgeon: Eulogio Bear, MD;  Location: [facility] OR[facility];  Service: Ophthalmology;  Laterality: Left;  [place] 00.23.4 AP% 7.9 CDE 1.83 Fluid Pack Lot # [id number]   COLONOSCOPY WITH PROPOFOL N/A 02/15/2017   Procedure: COLONOSCOPY WITH PROPOFOL;  Surgeon: [name], MD;  Location: [facility] ENDOSCOPY;  Service: Gastroenterology;  Laterality: N/A;   CORONARY ANGIOPLASTY     STENT   CORONARY STENT INTERVENTION N/A 09/07/2016   Procedure: Coronary Stent Intervention;  Surgeon: Isaias Cowman, MD;  Location: [place] [facility];  Service: Cardiovascular;  Laterality: N/A;   DIALYSIS/PERMA CATHETER INSERTION N/A 09/21/2016   Procedure:  Dialysis/Perma Catheter Insertion;  Surgeon: Algernon Huxley, MD;  Location: [place] [facility];  Service: Cardiovascular;  Laterality: N/A;   DIALYSIS/PERMA CATHETER REMOVAL Right 11/30/2016   Procedure: DIALYSIS/PERMA CATHETER REMOVAL;  Surgeon: Algernon Huxley, MD;  Location: [facility] OR[facility];  Service: Vascular;  Laterality: Right;   ESOPHAGOGASTRODUODENOSCOPY (EGD) WITH PROPOFOL N/A 02/15/2017   Procedure: ESOPHAGOGASTRODUODENOSCOPY  (EGD) WITH PROPOFOL;  Surgeon: [name], MD;  Location: [facility] ENDOSCOPY;  Service: Gastroenterology;  Laterality: N/A;   EYE SURGERY     GIVENS CAPSULE STUDY N/A 02/26/2017   Procedure: GIVENS CAPSULE STUDY;  Surgeon: [name], MD;  Location: [facility] ENDOSCOPY;  Service: Gastroenterology;  Laterality: N/A;   INSERTION OF DIALYSIS CATHETER N/A 04/07/2018   Procedure: INSERTION OF DIALYSIS CATHETER;  Surgeon: Rosetta Posner, MD;  Location: [facility] OR;  Service: Vascular;  Laterality: N/A;   LEFT HEART CATH AND CORONARY ANGIOGRAPHY Left 09/07/2016   Procedure: Left Heart Cath and Coronary Angiography;  Surgeon: Isaias Cowman, MD;  Location: [place] [facility];  Service: Cardiovascular;  Laterality: Left;   LIGATION OF ARTERIOVENOUS  FISTULA Left 02/07/2017   Procedure: LIGATION OF ARTERIOVENOUS  FISTULA ( BANDING BRACHIAL CEPHALIC );  Surgeon: Katha Cabal, MD;  Location: [facility] OR[facility];  Service: Vascular;  Laterality: Left;   TUBAL LIGATION     UPPER EXTREMITY ANGIOGRAPHY Left 01/19/2017   Procedure: Upper Extremity Angiography;  Surgeon: Katha Cabal, MD;  Location: [place] [facility];  Service: Cardiovascular;  Laterality: Left;     FAMILY HISTORY   Family History  Problem Relation Age of Onset   Diabetes Mother    Breast cancer Neg Hx      SOCIAL HISTORY   Social History   Tobacco Use   Smoking status: Former    Types: Cigarettes    Quit date: 10/09/2008    Years since quitting: 13.7   Smokeless tobacco: Never  Vaping Use   Vaping Use: Never used  Substance Use Topics   Alcohol use: No   Drug use: No     MEDICATIONS   Current Medication:  Current Facility-Administered Medications:    docusate sodium (COLACE) capsule 100 mg, 100 mg, Oral, BID PRN, Lang Snow, NP   heparin injection 5,000 Units, 5,000 Units, Subcutaneous, Q8H, [place], Bing Neighbors, NP, 5,000 Units at 07/23/22 0650   insulin aspart (novoLOG) injection 0-9 Units, 0-9 Units,  Subcutaneous, Q4H, Ouma, Bing Neighbors, NP, 2 Units at 07/23/22 0745   nitroGLYCERIN 50 mg in dextrose 5 % 250 mL (0.2 mg/mL) infusion, 0-200 mcg/min, Intravenous, Continuous, Ouma, Bing Neighbors, NP, Last Rate: 60 mL/hr at 07/23/22 0650, 200 mcg/min at 07/23/22 0650   polyethylene glycol (MIRALAX / GLYCOLAX) packet 17 g, 17 g, Oral, Daily PRN, Lang Snow, NP  Current Outpatient Medications:    amLODipine (NORVASC) 10 MG tablet, Take 1 tablet (10 mg total) by mouth daily., Disp: 30 tablet, Rfl: 1   aspirin EC 81 MG tablet, Take 1 tablet (81 mg total) by mouth daily. Start taking after NOV 15 (Patient taking differently: Take 81 mg by mouth daily.), Disp: 30 tablet, Rfl: 0   calcitRIOL (ROCALTROL) 0.5 MCG capsule, Take 0.5 mcg by mouth daily., Disp: , Rfl:    calcium acetate (PHOSLO) 667 MG capsule, Take 2,001 mg by mouth 3 (three) times daily with meals., Disp: , Rfl:    carvedilol (COREG) 25 MG tablet, Take 25 mg by mouth 2 (two) times daily with a meal., Disp: , Rfl:  furosemide (LASIX) 80 MG tablet, Take 80 mg by mouth daily., Disp: , Rfl:    hydrALAZINE (APRESOLINE) 100 MG tablet, Take 1 tablet (100 mg total) by mouth 2 (two) times daily., Disp: 60 tablet, Rfl: 1   insulin aspart (NOVOLOG) 100 UNIT/ML FlexPen, Inject 4-8 Units into the skin 3 (three) times daily with meals. Sliding scale  Below 200 take 4 units 200-250=6 units  >250= 8 units, Disp: , Rfl:    irbesartan (AVAPRO) 150 MG tablet, Take 1 tablet (150 mg total) by mouth daily., Disp: 30 tablet, Rfl: 1   iron polysaccharides (NIFEREX) 150 MG capsule, Take 150 mg by mouth daily., Disp: , Rfl:    levothyroxine (SYNTHROID, LEVOTHROID) 112 MCG tablet, Take 112 mcg by mouth daily before breakfast. , Disp: , Rfl:    Lidocaine, Anorectal, 5 % CREA, Apply 1 application topically as needed., Disp: , Rfl:    lovastatin (MEVACOR) 40 MG tablet, Take 40 mg by mouth at bedtime., Disp: , Rfl:    Multiple Vitamins-Minerals  (DIALYVITE 800/ULTRA D) TABS, Take 1 tablet by mouth daily., Disp: , Rfl:    oxyCODONE (OXY IR/ROXICODONE) 5 MG immediate release tablet, Take 5 mg by mouth every 6 (six) hours as needed., Disp: , Rfl:    pantoprazole (PROTONIX) 40 MG tablet, Take 1 tablet (40 mg total) by mouth 2 (two) times daily. (Patient taking differently: Take 40 mg by mouth daily.), Disp: 60 tablet, Rfl: 0   sevelamer carbonate (RENVELA) 800 MG tablet, Take 1,600 mg by mouth 3 (three) times daily with meals., Disp: , Rfl:    TOUJEO SOLOSTAR 300 UNIT/ML SOPN, Inject 24 Units into the skin Nightly., Disp: , Rfl:    urea (CARMOL) 20 % cream, Apply 1 application topically daily as needed (for dry skin)., Disp: , Rfl:     ALLERGIES   Oxycodone, Penicillin g, and Valacyclovir    REVIEW OF SYSTEMS    10 point ROS done and is negative except for dyspnea/sob  PHYSICAL EXAMINATION   Vital Signs: Temp:  [98 F (36.7 C)] 98 F (36.7 C) (12/31 0441) Pulse Rate:  [72-95] 74 (12/31 0720) Resp:  [16-31] 20 (12/31 0720) BP: (188-245)/(68-110) 188/71 (12/31 0720) SpO2:  [97 %-100 %] 100 % (12/31 0720) FiO2 (%):  [45 %] 45 % (12/31 0500)  GENERAL:Age appropriate mildly encephalopathic , NAD HEAD: Normocephalic, atraumatic.  EYES: Pupils equal, round, reactive to light.  No scleral icterus.  MOUTH: Moist mucosal membrane. NECK: Supple. No thyromegaly. No nodules. No JVD.  PULMONARY: rhonchi and crackles bilaterally CARDIOVASCULAR: S1 and S2. Regular rate and rhythm. No murmurs, rubs, or gallops.  GASTROINTESTINAL: Soft, nontender, non-distended. No masses. Positive bowel sounds. No hepatosplenomegaly.  MUSCULOSKELETAL: No swelling, clubbing, or edema.  NEUROLOGIC: Mild distress due to acute illness SKIN:intact,warm,dry   PERTINENT DATA     Infusions:  nitroGLYCERIN 200 mcg/min (07/23/22 0650)   Scheduled Medications:  heparin  5,000 Units Subcutaneous Q8H   insulin aspart  0-9 Units Subcutaneous Q4H   PRN  Medications: docusate sodium, polyethylene glycol Hemodynamic parameters:   Intake/Output: No intake/output data recorded.  Ventilator  Settings: FiO2 (%):  [45 %] 45 %    LAB RESULTS:  Basic Metabolic Panel: Recent Labs  Lab 07/23/22 0446  NA 140  K 4.7  CL 102  CO2 25  GLUCOSE 192*  BUN 55*  CREATININE 7.44*  CALCIUM 9.0  MG 2.3   Liver Function Tests: Recent Labs  Lab 07/23/22 0446  AST 22  ALT  18  ALKPHOS 66  BILITOT 0.7  PROT 7.9  ALBUMIN 4.2   No results for input(s): "LIPASE", "AMYLASE" in the last 168 hours. No results for input(s): "AMMONIA" in the last 168 hours. CBC: Recent Labs  Lab 07/23/22 0446  WBC 8.3  NEUTROABS 6.6  HGB 12.3  HCT 36.2  MCV 95.3  PLT 101*   Cardiac Enzymes: No results for input(s): "CKTOTAL", "CKMB", "CKMBINDEX", "TROPONINI" in the last 168 hours. BNP: Invalid input(s): "POCBNP" CBG: Recent Labs  Lab 07/23/22 0731  GLUCAP 158*       IMAGING RESULTS:  Imaging: DG Chest Portable 1 View  Result Date: 07/23/2022 CLINICAL DATA:  71 year old female with suspected volume overload. End stage renal disease and respiratory distress. EXAM: PORTABLE CHEST 1 VIEW COMPARISON:  Chest radiographs 12/06/2020 and earlier. FINDINGS: Portable AP semi upright view at 0539 hours. Stable lung volumes and mediastinal contours. Calcified aortic atherosclerosis. Borderline to mild cardiomegaly. Visualized tracheal air column is within normal limits. Chronic but increased interstitial markings diffusely, and trace new fluid in the right minor fissure. No definite layering pleural effusions. No pneumothorax or consolidation. Chronic left subclavian vascular stent. Paucity of bowel gas in the visible abdomen. No acute osseous abnormality identified. IMPRESSION: 1. Acute on chronic pulmonary interstitial opacity and new fluid in the right minor fissure compatible with pulmonary interstitial edema. 2. Aortic Atherosclerosis (ICD10-I70.0).  Electronically Signed   By: Genevie Ann M.D.   On: 07/23/2022 05:59   '@PROBHOSP'$ @ DG Chest Portable 1 View  Result Date: 07/23/2022 CLINICAL DATA:  71 year old female with suspected volume overload. End stage renal disease and respiratory distress. EXAM: PORTABLE CHEST 1 VIEW COMPARISON:  Chest radiographs 12/06/2020 and earlier. FINDINGS: Portable AP semi upright view at 0539 hours. Stable lung volumes and mediastinal contours. Calcified aortic atherosclerosis. Borderline to mild cardiomegaly. Visualized tracheal air column is within normal limits. Chronic but increased interstitial markings diffusely, and trace new fluid in the right minor fissure. No definite layering pleural effusions. No pneumothorax or consolidation. Chronic left subclavian vascular stent. Paucity of bowel gas in the visible abdomen. No acute osseous abnormality identified. IMPRESSION: 1. Acute on chronic pulmonary interstitial opacity and new fluid in the right minor fissure compatible with pulmonary interstitial edema. 2. Aortic Atherosclerosis (ICD10-I70.0). Electronically Signed   By: Genevie Ann M.D.   On: 07/23/2022 05:59     ASSESSMENT AND PLAN    -Multidisciplinary rounds held today  Acute Hypoxic Respiratory Failure -currently being tested for viral acute LRTI -  -there are findings clinically and on labs to suggest acute onset of decompensated CHF including severe pulmonary edema, LE edema, severe hypoxemia, severe BNP elevation.  She did have TTE in 2017 with EF 45%, will ask for additional evaluation with [facility] cardio.  Repeat TTE -if influenza or COVID is + start antiviral  -patient is on dialysis and needs treatment will consult nephrology  Acute decompensated systolic CHF with EF <14% -oxygen as needed -Lasix as tolerated -follow up cardiac biomarkers as indicated ICU monitoring  Renal Failure-ESRD on HD -follow chem 7 -follow UO -HD today if able - nephrology consult - appreciate input -Left upper extermity  fistula  Metabolic encephalopathy - re-asess after treatment of accelerated HTN and renal failure with dialysis   Accelerated HTN   S/p Nitroglycerin drip    - restart po clonidine   - perform HD   - no pain/trauma/tenderness clinically during examination  GI/Nutrition GI PROPHYLAXIS as indicated DIET-->TF's as tolerated Constipation protocol as indicated  ENDO - ICU hypoglycemic\Hyperglycemia protocol -check FSBS per protocol   ELECTROLYTES -follow labs as needed -replace as needed -pharmacy consultation   DVT/GI PRX ordered -SCDs  TRANSFUSIONS AS NEEDED MONITOR FSBS ASSESS the need for LABS as needed   Critical care provider statement:   Total critical care time: 33 minutes   Performed by: Lanney Gins MD   Critical care time was exclusive of separately billable procedures and treating other patients.   Critical care was necessary to treat or prevent imminent or life-threatening deterioration.   Critical care was time spent personally by me on the following activities: development of treatment plan with patient and/or surrogate as well as nursing, discussions with consultants, evaluation of patient's response to treatment, examination of patient, obtaining history from patient or surrogate, ordering and performing treatments and interventions, ordering and review of laboratory studies, ordering and review of radiographic studies, pulse oximetry and re-evaluation of patient's condition.     Ottie Glazier, M.D.  Division of [place]

## 2022-07-23 NOTE — Progress Notes (Signed)
Pt uses 16g needles for HD. BFR 300 due to high vp.

## 2022-07-23 NOTE — ED Provider Notes (Signed)
[facility] Provider Note    Event Date/Time   First MD Initiated Contact with Patient 07/23/22 [phone]     (approximate)   History   Chest Pain (From Home: Dialysis MWF, states she has not missed any appt. Today c/o SHOB and chest pain. Dyspena and hypoxic at 85% on RA. EMS Gave Nitro x 2 )   HPI  Bethany Lee Bethany Lee is a 71 y.o. female who presents to the ED for evaluation of Chest Pain (From Home: Dialysis MWF, states she has not missed any appt. Today c/o SHOB and chest pain. Dyspena and hypoxic at 85% on RA. EMS Gave Nitro x 2 )   I reviewed the [facility] pretransplant evaluation for ESRD.  Currently on hemodialysis via LUE fistula, MWF.  DM.  Patient presents from home via EMS for the evaluation of chest pain and shortness of breath.  History limited by her respiratory status and possible language barrier.  Later history obtained by the husband at the bedside when he arrives indicates that patient was fine last night prior to bed.  No recent illnesses.  No missed dialysis sessions.  He woke up to find her sitting upright in bed and rapidly worsening complaining of chest pain shortness of breath.  Physical Exam   Triage Vital Signs: ED Triage Vitals  Enc Vitals Group     BP      Pulse      Resp      Temp      Temp src      SpO2      Weight      Height      Head Circumference      Peak Flow      Pain Score      Pain Loc      Pain Edu?      Excl. in [place]?     Most recent vital signs: Vitals:   07/23/22 0545 07/23/22 0600  BP: (!) 224/76 (!) 220/78  Pulse: 78 77  Resp: (!) 25 19  Temp:    SpO2: 100% 100%    General: Awake.  Sitting upright in bed, tripoding and tachypneic to about 30.  Belly breathing, retractions subcostal of present CV:  Good peripheral perfusion.  Quite hypertensive. Resp:  Tachycardia with coarse sounds throughout Abd:  No distention.  Soft and benign MSK:  No deformity noted.  Peripheral edema is noted Neuro:  No focal  deficits appreciated. Other:     ED Results / Procedures / Treatments   Labs (all labs ordered are listed, but only abnormal results are displayed) Labs Reviewed  COMPREHENSIVE METABOLIC PANEL - Abnormal; Notable for the following components:      Result Value   Glucose, Bld 192 (*)    BUN 55 (*)    Creatinine, Ser 7.44 (*)    GFR, Estimated 5 (*)    All other components within normal limits  CBC WITH DIFFERENTIAL/PLATELET - Abnormal; Notable for the following components:   RBC 3.80 (*)    Platelets 101 (*)    All other components within normal limits  BLOOD GAS, ARTERIAL - Abnormal; Notable for the following components:   pH, Arterial 7.48 (*)    pO2, Arterial 181 (*)    All other components within normal limits  TROPONIN I (HIGH SENSITIVITY) - Abnormal; Notable for the following components:   Troponin I (High Sensitivity) 24 (*)    All other components within normal limits  RESP  PANEL BY RT-PCR (RSV, FLU A&B, COVID)  [id number]  MAGNESIUM  HEMOGLOBIN A1C  BRAIN NATRIURETIC PEPTIDE    EKG Sinus rhythm with a rate of 96 bpm.  Normal axis and intervals.  Nonspecific ST changes without STEMI.  Some tremulous baseline without clear ischemic features.  RADIOLOGY 1 view CXR interpreted by me with pulmonary vascular congestion  Official radiology report(s): DG Chest Portable 1 View  Result Date: 07/23/2022 CLINICAL DATA:  71 year old female with suspected volume overload. End stage renal disease and respiratory distress. EXAM: PORTABLE CHEST 1 VIEW COMPARISON:  Chest radiographs 12/06/2020 and earlier. FINDINGS: Portable AP semi upright view at 0539 hours. Stable lung volumes and mediastinal contours. Calcified aortic atherosclerosis. Borderline to mild cardiomegaly. Visualized tracheal air column is within normal limits. Chronic but increased interstitial markings diffusely, and trace new fluid in the right minor fissure. No definite layering pleural effusions. No pneumothorax or  consolidation. Chronic left subclavian vascular stent. Paucity of bowel gas in the visible abdomen. No acute osseous abnormality identified. IMPRESSION: 1. Acute on chronic pulmonary interstitial opacity and new fluid in the right minor fissure compatible with pulmonary interstitial edema. 2. Aortic Atherosclerosis (ICD10-I70.0). Electronically Signed   By: Genevie Ann M.D.   On: 07/23/2022 05:59    PROCEDURES and INTERVENTIONS:  .1-3 Lead EKG Interpretation  Performed by: Vladimir Crofts, MD Authorized by: Vladimir Crofts, MD     Interpretation: normal     ECG rate:  70   ECG rate assessment: normal     Rhythm: sinus rhythm     Ectopy: none     Conduction: normal   .Critical Care  Performed by: Vladimir Crofts, MD Authorized by: Vladimir Crofts, MD   Critical care provider statement:    Critical care time (minutes):  30   Critical care time was exclusive of:  Separately billable procedures and treating other patients   Critical care was necessary to treat or prevent imminent or life-threatening deterioration of the following conditions:  Respiratory failure, cardiac failure and circulatory failure   Critical care was time spent personally by me on the following activities:  Development of treatment plan with patient or surrogate, discussions with consultants, evaluation of patient's response to treatment, examination of patient, ordering and review of laboratory studies, ordering and review of radiographic studies, ordering and performing treatments and interventions, pulse oximetry, re-evaluation of patient's condition and review of old charts   Medications  nitroGLYCERIN 50 mg in dextrose 5 % 250 mL (0.2 mg/mL) infusion (100 mcg/min Intravenous Rate/Dose Change 07/23/22 0600)  docusate sodium (COLACE) capsule 100 mg (has no administration in time range)  polyethylene glycol (MIRALAX / GLYCOLAX) packet 17 g (has no administration in time range)  insulin aspart (novoLOG) injection 0-9 Units (has no  administration in time range)  heparin injection 5,000 Units (has no administration in time range)     IMPRESSION / MDM / ASSESSMENT AND PLAN / ED COURSE  I reviewed the triage vital signs and the nursing notes.  Differential diagnosis includes, but is not limited to, pneumothorax, acute pulmonary edema, viral syndrome, ACS, pneumonia, pleural effusion  {Patient presents with symptoms of an acute illness or injury that is potentially life-threatening.  71 year old dialysis patient presents with rapidly decompensated respiratory status, likely acute pulmonary edema requiring BiPAP and nitroglycerin drip.  She looks unwell on arrival, tripoding, very hypertensive and with rhonchorous breath sounds throughout.  Clinically flash pulmonary edema.  CXR corroborates this.  EKG is nonischemic.  Troponin is slightly elevated.  ABG  with normal CO2.  CBC and CMP are chronic without concerning acute features.  Rapidly improving clinical picture with BiPAP and nitroglycerin drip.  Consulted with ICU for admission considering the titration of nitroglycerin drip and history of hospitalist service not taking his patients.  Clinical Course as of 07/23/22 5051  Sun Jul 23, 2022  0524 Reassessed.  Looks clinically better.  Respiratory rate still about 30 and she is quite hypertensive with systolics in the 833P.  I asked the nurse to pull nitroglycerin drip.  Her husband is now at the bedside and provides some supplemental history that is included in the HPI [DS]  0544 I consult with ICU NP, Karle Barr, agrees to see for admission [DS]    Clinical Course User Index [DS] Vladimir Crofts, MD     FINAL CLINICAL IMPRESSION(S) / ED DIAGNOSES   Final diagnoses:  Acute pulmonary edema ([place])  ESRD (end stage renal disease) on dialysis [facility])     Rx / DC Orders   ED Discharge Orders     None        Note:  This document was prepared using Dragon voice recognition software and may include unintentional  dictation errors.   Vladimir Crofts, MD 07/23/22 [phone]

## 2022-07-23 NOTE — Progress Notes (Signed)
Pt HD ended 15 minutes early d/t multiple ap and vp alarms and high pressures. Pt does not want new system primed and tx resumed. Alert, no c/o, stable, report to ED RN.  Start: 1535 End: 1835 2468m fluid removed 50.7L BVP 64.1Kg Post hd standing weight No HD meds ordered

## 2022-07-23 NOTE — ED Notes (Signed)
Pt removed from bi pap to speak with MD. Sherald Hess well. 2L [place] placed for comfort. RT aware.

## 2022-07-23 NOTE — Progress Notes (Signed)
Pt removed from BiPAP by RN

## 2022-07-23 NOTE — Progress Notes (Signed)
[facility]  ROUNDING NOTE   Subjective:   Bethany Lee is a 71 year old female with past medical conditions including hypertension, and end-stage renal disease on hemodialysis.  Patient presents to the emergency department with complaints of shortness of breath and has been admitted for Flash pulmonary edema ([place]) [J81.0] Acute hypoxemic respiratory failure ([place]) [J96.01]  Patient is known to our practice and receives outpatient dialysis treatments at [facility] on a MWF schedule, supervised by Dr. Candiss Norse.  Last treatment was received on Friday, full treatment with no complications.  Patient is seen sitting up on stretcher with husband at bedside.  They states she progressively became short of breath yesterday evening.  They do report eating some spiral ham for dinner.  Denies any sick contacts.  Denies nausea or vomiting.  No known fever or chills.  Denies any missed recent treatments.  Labs on ED arrival significant for glucose 192, BUN 55, creatinine 7.44 with GFR 5, BNP greater than 2200, and troponin 24.  Respiratory panel negative for influenza, COVID-19, or RSV.  Chest x-ray shows pulmonary interstitial opacities with edema.  We have been consulted to evaluate dialysis needs during this admission.   Objective:  Vital signs in last 24 hours:  Temp:  [97.8 F (36.6 C)-98 F (36.7 C)] 97.8 F (36.6 C) (12/31 1021) Pulse Rate:  [72-95] 77 (12/31 1140) Resp:  [16-31] 22 (12/31 1140) BP: (188-245)/(64-110) 191/65 (12/31 1158) SpO2:  [85 %-100 %] 97 % (12/31 1140) FiO2 (%):  [45 %] 45 % (12/31 0500)  Weight change:  There were no vitals filed for this visit.  Intake/Output: No intake/output data recorded.   Intake/Output this shift:  No intake/output data recorded.  Physical Exam: General: NAD  Head: Normocephalic, atraumatic. Moist oral mucosal membranes  Eyes: Anicteric  Lungs:  Crackles, mild tachypnea, [place] O2  Heart: Regular rate and rhythm   Abdomen:  Soft, nontender, nondistended  Extremities: Trace peripheral edema.  Neurologic: Nonfocal, moving all four extremities  Skin: No lesions  Access: Left aVF    Basic Metabolic Panel: Recent Labs  Lab 07/23/22 0446  NA 140  K 4.7  CL 102  CO2 25  GLUCOSE 192*  BUN 55*  CREATININE 7.44*  CALCIUM 9.0  MG 2.3    Liver Function Tests: Recent Labs  Lab 07/23/22 0446  AST 22  ALT 18  ALKPHOS 66  BILITOT 0.7  PROT 7.9  ALBUMIN 4.2   No results for input(s): "LIPASE", "AMYLASE" in the last 168 hours. No results for input(s): "AMMONIA" in the last 168 hours.  CBC: Recent Labs  Lab 07/23/22 0446  WBC 8.3  NEUTROABS 6.6  HGB 12.3  HCT 36.2  MCV 95.3  PLT 101*    Cardiac Enzymes: No results for input(s): "CKTOTAL", "CKMB", "CKMBINDEX", "TROPONINI" in the last 168 hours.  BNP: Invalid input(s): "POCBNP"  CBG: Recent Labs  Lab 07/23/22 0731 07/23/22 0926 07/23/22 1155  GLUCAP 158* 138* 133*    Microbiology: Results for orders placed or performed during the hospital encounter of 07/23/22  Resp panel by RT-PCR (RSV, Flu A&B, Covid) Anterior Nasal Swab     Status: None   Collection Time: 07/23/22  7:37 AM   Specimen: Anterior Nasal Swab  Result Value Ref Range Status   SARS Coronavirus 2 by RT PCR NEGATIVE NEGATIVE Final    Comment: (NOTE) SARS-CoV-2 target nucleic acids are NOT DETECTED.  The SARS-CoV-2 RNA is generally detectable in upper respiratory specimens during the acute  phase of infection. The lowest concentration of SARS-CoV-2 viral copies this assay can detect is 138 copies/mL. A negative result does not preclude SARS-Cov-2 infection and should not be used as the sole basis for treatment or other patient management decisions. A negative result may occur with  improper specimen collection/handling, submission of specimen other than nasopharyngeal swab, presence of viral mutation(s) within the areas targeted by this assay, and  inadequate number of viral copies(<138 copies/mL). A negative result must be combined with clinical observations, patient history, and epidemiological information. The expected result is Negative.  Fact Sheet for Patients:  EntrepreneurPulse.com.au  Fact Sheet for Healthcare Providers:  IncredibleEmployment.be  This test is no t yet approved or cleared by the [place] FDA and  has been authorized for detection and/or diagnosis of SARS-CoV-2 by FDA under an Emergency Use Authorization (EUA). This EUA will remain  in effect (meaning this test can be used) for the duration of the COVID-19 declaration under Section 564(b)(1) of the Act, 21 U.S.C.section 360bbb-3(b)(1), unless the authorization is terminated  or revoked sooner.       Influenza A by PCR NEGATIVE NEGATIVE Final   Influenza B by PCR NEGATIVE NEGATIVE Final    Comment: (NOTE) The Xpert Xpress SARS-CoV-2/FLU/RSV plus assay is intended as an aid in the diagnosis of influenza from Nasopharyngeal swab specimens and should not be used as a sole basis for treatment. Nasal washings and aspirates are unacceptable for Xpert Xpress SARS-CoV-2/FLU/RSV testing.  Fact Sheet for Patients: EntrepreneurPulse.com.au  Fact Sheet for Healthcare Providers: IncredibleEmployment.be  This test is not yet approved or cleared by the [place] FDA and has been authorized for detection and/or diagnosis of SARS-CoV-2 by FDA under an Emergency Use Authorization (EUA). This EUA will remain in effect (meaning this test can be used) for the duration of the COVID-19 declaration under Section 564(b)(1) of the Act, 21 U.S.C. section 360bbb-3(b)(1), unless the authorization is terminated or revoked.     Resp Syncytial Virus by PCR NEGATIVE NEGATIVE Final    Comment: (NOTE) Fact Sheet for Patients: EntrepreneurPulse.com.au  Fact Sheet for Healthcare  Providers: IncredibleEmployment.be  This test is not yet approved or cleared by the [place] FDA and has been authorized for detection and/or diagnosis of SARS-CoV-2 by FDA under an Emergency Use Authorization (EUA). This EUA will remain in effect (meaning this test can be used) for the duration of the COVID-19 declaration under Section 564(b)(1) of the Act, 21 U.S.C. section 360bbb-3(b)(1), unless the authorization is terminated or revoked.  Performed at [facility], [address] [place], [place] [address]     Coagulation Studies: No results for input(s): "LABPROT", "INR" in the last 72 hours.  Urinalysis: No results for input(s): "COLORURINE", "LABSPEC", "PHURINE", "GLUCOSEU", "HGBUR", "BILIRUBINUR", "KETONESUR", "PROTEINUR", "UROBILINOGEN", "NITRITE", "LEUKOCYTESUR" in the last 72 hours.  Invalid input(s): "APPERANCEUR"    Imaging: DG Chest Portable 1 View  Result Date: 07/23/2022 CLINICAL DATA:  71 year old female with suspected volume overload. End stage renal disease and respiratory distress. EXAM: PORTABLE CHEST 1 VIEW COMPARISON:  Chest radiographs 12/06/2020 and earlier. FINDINGS: Portable AP semi upright view at 0539 hours. Stable lung volumes and mediastinal contours. Calcified aortic atherosclerosis. Borderline to mild cardiomegaly. Visualized tracheal air column is within normal limits. Chronic but increased interstitial markings diffusely, and trace new fluid in the right minor fissure. No definite layering pleural effusions. No pneumothorax or consolidation. Chronic left subclavian vascular stent. Paucity of bowel gas in the visible abdomen. No acute osseous abnormality identified.  IMPRESSION: 1. Acute on chronic pulmonary interstitial opacity and new fluid in the right minor fissure compatible with pulmonary interstitial edema. 2. Aortic Atherosclerosis (ICD10-I70.0). Electronically Signed   By: Genevie Ann M.D.   On: 07/23/2022 05:59      Medications:    anticoagulant sodium citrate     nitroGLYCERIN 200 mcg/min (07/23/22 1118)    amLODipine  10 mg Oral Daily   aspirin  324 mg Oral NOW   Or   aspirin  300 mg Rectal NOW   carvedilol  12.5 mg Oral BID WC   Chlorhexidine Gluconate Cloth  6 each Topical Q0600   cloNIDine  0.1 mg Oral TID   heparin  5,000 Units Subcutaneous Q8H   insulin aspart  0-9 Units Subcutaneous Q4H   alteplase, anticoagulant sodium citrate, docusate sodium, heparin, hydrALAZINE, lidocaine (PF), lidocaine-prilocaine, pentafluoroprop-tetrafluoroeth, polyethylene glycol  Assessment/ Plan:  Ms. Alaja Goldinger is a 71 y.o.  female with past medical conditions including hypertension, and end-stage renal disease on hemodialysis.  Patient presents to the emergency department with complaints of shortness of breath and has been admitted for Flash pulmonary edema ([place]) [J81.0] Acute hypoxemic respiratory failure ([place]) [J96.01]  [facility]/MWF/left aVF  Acute respiratory failure with end-stage renal disease on hemodialysis.  Last treatment completed on Friday.  Patient reports eating high sodium meal.  Required BiPAP on ED arrival.  Now weaned to nasal cannula.  Will perform urgent dialysis today, UF goal 3 L as tolerated.  Will assess patient tomorrow and determine if additional treatment is needed.  2. Anemia of chronic kidney disease Lab Results  Component Value Date   HGB 12.3 07/23/2022    Hemoglobin above desired target.  Will continue to monitor.  No need for ESA's at this time.  3. Secondary Hyperparathyroidism: with outpatient labs: PTH 306, phosphorus 8.4, calcium 6.9 on 07/03/22.   Lab Results  Component Value Date   PTH 82 (H) 04/05/2018   CALCIUM 9.0 07/23/2022   PHOS 5.3 (H) 04/09/2018    Calcium within desired range.  Will continue to monitor bone minerals during this admission.  Calcitriol and calcium  acetate ordered outpatient.  4.  Hypertension with chronic  kidney disease.  Home regimen includes amlodipine, carvedilol, furosemide, hydralazine, and irbesartan.  Initiated on nitroglycerin drip on ED arrival.  Currently receiving amlodipine, carvedilol, and clonidine.  Weaning nitro drip.   LOS: 0 Jocob Dambach 12/31/202312:20 PM

## 2022-07-23 NOTE — Progress Notes (Signed)
Pre hd rn assessment 

## 2022-07-24 DIAGNOSIS — J9600 Acute respiratory failure, unspecified whether with hypoxia or hypercapnia: Secondary | ICD-10-CM | POA: Diagnosis not present

## 2022-07-24 DIAGNOSIS — N2581 Secondary hyperparathyroidism of renal origin: Secondary | ICD-10-CM | POA: Diagnosis not present

## 2022-07-24 DIAGNOSIS — D631 Anemia in chronic kidney disease: Secondary | ICD-10-CM | POA: Diagnosis not present

## 2022-07-24 LAB — RESPIRATORY PANEL BY PCR

## 2022-07-24 LAB — CBG MONITORING, ED
Glucose-Capillary: 104 mg/dL — ABNORMAL HIGH (ref 70–99)
Glucose-Capillary: 110 mg/dL — ABNORMAL HIGH (ref 70–99)
Glucose-Capillary: 86 mg/dL (ref 70–99)

## 2022-07-24 MED ORDER — IRBESARTAN 150 MG PO TABS
150.0000 mg | ORAL_TABLET | Freq: Every day | ORAL | Status: DC
  Filled 2022-07-24: qty 1

## 2022-07-24 MED ORDER — HYDRALAZINE HCL 50 MG PO TABS
100.0000 mg | ORAL_TABLET | Freq: Two times a day (BID) | ORAL | Status: DC
  Administered 2022-07-24: 100 mg via ORAL
  Filled 2022-07-24: qty 2

## 2022-07-24 NOTE — Progress Notes (Signed)
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  She came in due to shortness of breath.  She states she was unable to get her HD done due to holiday and at same time shares with me she has not missed any sessions so there may still be component of metabolic encephalopathy during interview.. She was noted to have hypoxemia on arrival in ED at 85%. She has well functioning left arm fistula.  She was noted to have accelerated HTN and placed on nitroglycerin drip.  She had chest imaging with findings of cardiomegaly, blunting of costo and cardiophrenic angles, cephalization , kerley b lines and interstitial edema without consolidation, pneumothorax/hemothorax or broken ribs. Her BNP is markedly elevated higher then all other previous measurements in past 6 years >2200. In 2017 she had TTE with EF 45% and she had 75% stenosis of distal RCA and had DES done in 2018 with [facility] cardio.    07/24/22- patient seen and evaluated at bedside.  She is doing well clinically.  She was seen by nephrology had renal replacement therapy with markedly improved oxygenation.  She had cardiology evaluation with reassuring findings amenable to outpatient follow up.  Today bloodwork is improved and patient appears lucid interactive and clinically stable.  We are optimizing medically for home discharge and outpatient follow up.  Patient requests home discharge.   Lines/tubes :   Microbiology/Sepsis markers: Results for orders placed or performed during the hospital encounter of 07/23/22  Resp panel by RT-PCR (RSV, Flu A&B, Covid) Anterior Nasal Swab     Status: None   Collection Time: 07/23/22   7:37 AM   Specimen: Anterior Nasal Swab  Result Value Ref Range Status   SARS Coronavirus 2 by RT PCR NEGATIVE NEGATIVE Final    Comment: (NOTE) SARS-CoV-2 target nucleic acids are NOT DETECTED.  The SARS-CoV-2 RNA is generally detectable in upper respiratory specimens during the acute phase of infection. The lowest concentration of SARS-CoV-2 viral copies this assay can detect is 138 copies/mL. A negative result does not preclude SARS-Cov-2 infection and should not be used as the sole basis for treatment or other patient management decisions. A negative result may occur with  improper specimen collection/handling, submission of specimen other than nasopharyngeal swab, presence of viral mutation(s) within the areas targeted by this assay, and inadequate number of viral copies(<138 copies/mL). A negative result must be combined with clinical observations, patient history, and epidemiological information. The expected result is Negative.  Fact Sheet for Patients:  EntrepreneurPulse.com.au  Fact Sheet for Healthcare Providers:  IncredibleEmployment.be  This test is no t yet approved or cleared by the [place] FDA and  has been authorized for detection and/or diagnosis of SARS-CoV-2 by FDA under an Emergency Use Authorization (EUA). This EUA will remain  in effect (meaning this test can be used) for the duration of the COVID-19 declaration under Section 564(b)(1) of the Act, 21 U.S.C.section 360bbb-3(b)(1), unless the authorization is terminated  or revoked sooner.       Influenza A by PCR NEGATIVE NEGATIVE Final   Influenza B by PCR NEGATIVE NEGATIVE Final    Comment: (NOTE) The Xpert Xpress SARS-CoV-2/FLU/RSV plus assay is intended as an aid in the diagnosis of influenza from Nasopharyngeal swab specimens and should not  be used as a sole basis for treatment. Nasal washings and aspirates are unacceptable for Xpert Xpress  SARS-CoV-2/FLU/RSV testing.  Fact Sheet for Patients: EntrepreneurPulse.com.au  Fact Sheet for Healthcare Providers: IncredibleEmployment.be  This test is not yet approved or cleared by the [place] FDA and has been authorized for detection and/or diagnosis of SARS-CoV-2 by FDA under an Emergency Use Authorization (EUA). This EUA will remain in effect (meaning this test can be used) for the duration of the COVID-19 declaration under Section 564(b)(1) of the Act, 21 U.S.C. section 360bbb-3(b)(1), unless the authorization is terminated or revoked.     Resp Syncytial Virus by PCR NEGATIVE NEGATIVE Final    Comment: (NOTE) Fact Sheet for Patients: EntrepreneurPulse.com.au  Fact Sheet for Healthcare Providers: IncredibleEmployment.be  This test is not yet approved or cleared by the [place] FDA and has been authorized for detection and/or diagnosis of SARS-CoV-2 by FDA under an Emergency Use Authorization (EUA). This EUA will remain in effect (meaning this test can be used) for the duration of the COVID-19 declaration under Section 564(b)(1) of the Act, 21 U.S.C. section 360bbb-3(b)(1), unless the authorization is terminated or revoked.  Performed at [facility], [address] [place], [place] [address]   Respiratory (~20 pathogens) panel by PCR     Status: None   Collection Time: 07/23/22 12:22 PM   Specimen: Nasopharyngeal Swab; Respiratory  Result Value Ref Range Status   Adenovirus NOT DETECTED NOT DETECTED Final   Coronavirus 229E NOT DETECTED NOT DETECTED Final    Comment: (NOTE) The Coronavirus on the Respiratory Panel, DOES NOT test for the novel  Coronavirus (2019 nCoV)    Coronavirus HKU1 NOT DETECTED NOT DETECTED Final   Coronavirus NL63 NOT DETECTED NOT DETECTED Final   Coronavirus OC43 NOT DETECTED NOT DETECTED Final   Metapneumovirus NOT DETECTED NOT DETECTED Final    Rhinovirus / Enterovirus NOT DETECTED NOT DETECTED Final   Influenza A NOT DETECTED NOT DETECTED Final   Influenza B NOT DETECTED NOT DETECTED Final   Parainfluenza Virus 1 NOT DETECTED NOT DETECTED Final   Parainfluenza Virus 2 NOT DETECTED NOT DETECTED Final   Parainfluenza Virus 3 NOT DETECTED NOT DETECTED Final   Parainfluenza Virus 4 NOT DETECTED NOT DETECTED Final   Respiratory Syncytial Virus NOT DETECTED NOT DETECTED Final   Bordetella pertussis NOT DETECTED NOT DETECTED Final   Bordetella Parapertussis NOT DETECTED NOT DETECTED Final   Chlamydophila pneumoniae NOT DETECTED NOT DETECTED Final   Mycoplasma pneumoniae NOT DETECTED NOT DETECTED Final    Comment: Performed at [facility], [place]. [address], [place], [place] [address]    Anti-infectives:  Anti-infectives (From admission, onward)    None        PAST MEDICAL HISTORY   Past Medical History:  Diagnosis Date   Anemia    CHF (congestive heart failure) (HCC)    Chronic kidney disease    peritoneal dialysis   Complication of anesthesia    Coronary artery disease    Diabetes mellitus without complication ([place])    Dialysis patient [facility])    Peritoneal dialysis patient   Dyspnea    doe   Edema    History of recent blood transfusion 01/2017   Hypercholesterolemia    Hypertension    Hypothyroidism    Neuropathy    PONV (postoperative nausea and vomiting)    Shingles    October-November 2017   Steal syndrome as complication of dialysis access [facility])    Tremors of  nervous system    intermittent when taking gabapentin     SURGICAL HISTORY   Past Surgical History:  Procedure Laterality Date   A/V FISTULAGRAM Left 01/16/2017   Procedure: A/V Fistulagram;  Surgeon: Katha Cabal, MD;  Location: [place] CV LAB;  Service: Cardiovascular;  Laterality: Left;   A/V SHUNT INTERVENTION N/A 01/16/2017   Procedure: A/V Shunt Intervention;  Surgeon: Katha Cabal, MD;  Location: [place] CV  LAB;  Service: Cardiovascular;  Laterality: N/A;   ABDOMINAL HYSTERECTOMY     AV FISTULA PLACEMENT Left 11/30/2016   Procedure: ARTERIOVENOUS (AV) FISTULA CREATION ( EXPLORE FOR CREATION BRACHIOCEPHALIC);  Surgeon: Algernon Huxley, MD;  Location: [facility] ORS;  Service: Vascular;  Laterality: Left;   CAPD INSERTION N/A 11/25/2015   Procedure: LAPAROSCOPIC INSERTION CONTINUOUS AMBULATORY PERITONEAL DIALYSIS  (CAPD) CATHETER;  Surgeon: Algernon Huxley, MD;  Location: [facility] ORS;  Service: Vascular;  Laterality: N/A;   CAPD INSERTION N/A 01/06/2016   Procedure: LAPAROSCOPIC INSERTION CONTINUOUS AMBULATORY PERITONEAL DIALYSIS  (CAPD) CATHETER REVISION ;  Surgeon: Algernon Huxley, MD;  Location: [facility] ORS;  Service: Vascular;  Laterality: N/A;   CAPD INSERTION N/A 10/11/2016   Procedure: LAPAROSCOPIC INSERTION CONTINUOUS AMBULATORY PERITONEAL DIALYSIS  (CAPD) CATHETER ( REVISION );  Surgeon: Algernon Huxley, MD;  Location: [facility] ORS;  Service: Vascular;  Laterality: N/A;   CARDIAC CATHETERIZATION     CATARACT EXTRACTION W/PHACO Right 05/25/2016   Procedure: CATARACT EXTRACTION PHACO AND INTRAOCULAR LENS PLACEMENT ([place]);  Surgeon: Eulogio Bear, MD;  Location: [facility] ORS;  Service: Ophthalmology;  Laterality: Right;  Lot # [id number] H [place]: 00:5.2 AP%: 11.3 CDE: 7.21   CATARACT EXTRACTION W/PHACO Left 12/06/2017   Procedure: CATARACT EXTRACTION PHACO AND INTRAOCULAR LENS PLACEMENT ([facility]);  Surgeon: Eulogio Bear, MD;  Location: [facility] ORS;  Service: Ophthalmology;  Laterality: Left;  [place] 00.23.4 AP% 7.9 CDE 1.83 Fluid Pack Lot # [id number]   COLONOSCOPY WITH PROPOFOL N/A 02/15/2017   Procedure: COLONOSCOPY WITH PROPOFOL;  Surgeon: [name], MD;  Location: [facility] ENDOSCOPY;  Service: Gastroenterology;  Laterality: N/A;   CORONARY ANGIOPLASTY     STENT   CORONARY STENT INTERVENTION N/A 09/07/2016   Procedure: Coronary Stent Intervention;  Surgeon: Isaias Cowman, MD;  Location: [place] [facility] LAB;  Service: Cardiovascular;   Laterality: N/A;   DIALYSIS/PERMA CATHETER INSERTION N/A 09/21/2016   Procedure: Dialysis/Perma Catheter Insertion;  Surgeon: Algernon Huxley, MD;  Location: [place] [facility] LAB;  Service: Cardiovascular;  Laterality: N/A;   DIALYSIS/PERMA CATHETER REMOVAL Right 11/30/2016   Procedure: DIALYSIS/PERMA CATHETER REMOVAL;  Surgeon: Algernon Huxley, MD;  Location: [facility] ORS;  Service: Vascular;  Laterality: Right;   ESOPHAGOGASTRODUODENOSCOPY (EGD) WITH PROPOFOL N/A 02/15/2017   Procedure: ESOPHAGOGASTRODUODENOSCOPY (EGD) WITH PROPOFOL;  Surgeon: [name], MD;  Location: [facility] ENDOSCOPY;  Service: Gastroenterology;  Laterality: N/A;   EYE SURGERY     GIVENS CAPSULE STUDY N/A 02/26/2017   Procedure: GIVENS CAPSULE STUDY;  Surgeon: [name], MD;  Location: [facility] ENDOSCOPY;  Service: Gastroenterology;  Laterality: N/A;   INSERTION OF DIALYSIS CATHETER N/A 04/07/2018   Procedure: INSERTION OF DIALYSIS CATHETER;  Surgeon: Rosetta Posner, MD;  Location: [facility] OR;  Service: Vascular;  Laterality: N/A;   LEFT HEART CATH AND CORONARY ANGIOGRAPHY Left 09/07/2016   Procedure: Left Heart Cath and Coronary Angiography;  Surgeon: Isaias Cowman, MD;  Location: [place] [facility];  Service: Cardiovascular;  Laterality: Left;   LIGATION OF ARTERIOVENOUS  FISTULA Left 02/07/2017   Procedure:  LIGATION OF ARTERIOVENOUS  FISTULA ( BANDING BRACHIAL CEPHALIC );  Surgeon: Katha Cabal, MD;  Location: [facility] ORS;  Service: Vascular;  Laterality: Left;   TUBAL LIGATION     UPPER EXTREMITY ANGIOGRAPHY Left 01/19/2017   Procedure: Upper Extremity Angiography;  Surgeon: Katha Cabal, MD;  Location: [place] [facility] LAB;  Service: Cardiovascular;  Laterality: Left;     FAMILY HISTORY   Family History  Problem Relation Age of Onset   Diabetes Mother    Breast cancer Neg Hx      SOCIAL HISTORY   Social History   Tobacco Use   Smoking status: Former    Types: Cigarettes    Quit date: 10/09/2008    Years since  quitting: 13.7   Smokeless tobacco: Never  Vaping Use   Vaping Use: Never used  Substance Use Topics   Alcohol use: No   Drug use: No     MEDICATIONS   Current Medication:  Current Facility-Administered Medications:    alteplase (CATHFLO ACTIVASE) injection 2 mg, 2 mg, Intracatheter, Once PRN, Colon Flattery, NP   amLODipine (NORVASC) tablet 10 mg, 10 mg, Oral, Daily, Paraschos, Alexander, MD, 10 mg at 07/23/22 1008   anticoagulant sodium citrate solution 5 mL, 5 mL, Intracatheter, PRN, Colon Flattery, NP   carvedilol (COREG) tablet 12.5 mg, 12.5 mg, Oral, BID WC, Paraschos, Alexander, MD, 12.5 mg at 07/23/22 2106   Chlorhexidine Gluconate Cloth 2 % PADS 6 each, 6 each, Topical, Q0600, Colon Flattery, NP   cloNIDine (CATAPRES) tablet 0.1 mg, 0.1 mg, Oral, TID, Lanney Gins, Jarius Dieudonne, MD, 0.1 mg at 07/23/22 2151   docusate sodium (COLACE) capsule 100 mg, 100 mg, Oral, BID PRN, Ottie Glazier, MD   heparin injection 1,000 Units, 1,000 Units, Intracatheter, PRN, Colon Flattery, NP   heparin injection 5,000 Units, 5,000 Units, Subcutaneous, Q8H, Marleni Gallardo, MD, 5,000 Units at 07/23/22 2107   hydrALAZINE (APRESOLINE) injection 10 mg, 10 mg, Intravenous, Q6H PRN, Medlin, Erika L, RN, 10 mg at 07/24/22 0243   insulin aspart (novoLOG) injection 0-9 Units, 0-9 Units, Subcutaneous, Q4H, [place], Bing Neighbors, NP, 2 Units at 07/23/22 2107   lidocaine (PF) (XYLOCAINE) 1 % injection 5 mL, 5 mL, Intradermal, PRN, Breeze, Shantelle, NP   lidocaine-prilocaine (EMLA) cream 1 Application, 1 Application, Topical, PRN, Breeze, Shantelle, NP   nitroGLYCERIN 50 mg in dextrose 5 % 250 mL (0.2 mg/mL) infusion, 0-200 mcg/min, Intravenous, Continuous, [place]uma, Bing Neighbors, NP, Stopped at 07/23/22 1447   pentafluoroprop-tetrafluoroeth (GEBAUERS) aerosol 1 Application, 1 Application, Topical, PRN, Breeze, Shantelle, NP   polyethylene glycol (MIRALAX / GLYCOLAX) packet 17 g, 17 g, Oral, Daily PRN,  Ottie Glazier, MD  Current Outpatient Medications:    amLODipine (NORVASC) 10 MG tablet, Take 1 tablet (10 mg total) by mouth daily., Disp: 30 tablet, Rfl: 1   carvedilol (COREG) 12.5 MG tablet, Take 12.5 mg by mouth 2 (two) times daily with a meal., Disp: , Rfl:    hydrALAZINE (APRESOLINE) 100 MG tablet, Take 1 tablet (100 mg total) by mouth 2 (two) times daily., Disp: 60 tablet, Rfl: 1   levothyroxine (SYNTHROID, LEVOTHROID) 112 MCG tablet, Take 112 mcg by mouth daily before breakfast. , Disp: , Rfl:    lovastatin (MEVACOR) 40 MG tablet, Take 40 mg by mouth at bedtime., Disp: , Rfl:    aspirin EC 81 MG tablet, Take 1 tablet (81 mg total) by mouth daily. Start taking after NOV 15 (Patient taking differently: Take 81 mg by mouth daily.), Disp: 30  tablet, Rfl: 0   calcitRIOL (ROCALTROL) 0.5 MCG capsule, Take 0.5 mcg by mouth daily., Disp: , Rfl:    calcium acetate (PHOSLO) 667 MG capsule, Take 2,001 mg by mouth 3 (three) times daily with meals., Disp: , Rfl:    furosemide (LASIX) 80 MG tablet, Take 80 mg by mouth daily., Disp: , Rfl:    insulin aspart (NOVOLOG) 100 UNIT/ML FlexPen, Inject 4-8 Units into the skin 3 (three) times daily with meals. Sliding scale  Below 200 take 4 units 200-250=6 units  >250= 8 units, Disp: , Rfl:    irbesartan (AVAPRO) 150 MG tablet, Take 1 tablet (150 mg total) by mouth daily., Disp: 30 tablet, Rfl: 1   iron polysaccharides (NIFEREX) 150 MG capsule, Take 150 mg by mouth daily., Disp: , Rfl:    Lidocaine, Anorectal, 5 % CREA, Apply 1 application topically as needed., Disp: , Rfl:    Multiple Vitamins-Minerals (DIALYVITE 800/ULTRA D) TABS, Take 1 tablet by mouth daily., Disp: , Rfl:    pantoprazole (PROTONIX) 40 MG tablet, Take 1 tablet (40 mg total) by mouth 2 (two) times daily. (Patient taking differently: Take 40 mg by mouth daily.), Disp: 60 tablet, Rfl: 0   sevelamer carbonate (RENVELA) 800 MG tablet, Take 1,600 mg by mouth 3 (three) times daily with meals.,  Disp: , Rfl:    TOUJEO SOLOSTAR 300 UNIT/ML SOPN, Inject 24 Units into the skin Nightly., Disp: , Rfl:    urea (CARMOL) 20 % cream, Apply 1 application topically daily as needed (for dry skin)., Disp: , Rfl:     ALLERGIES   Oxycodone, Penicillin g, and Valacyclovir    REVIEW OF SYSTEMS    10 point ROS done and is negative except for dyspnea/sob  PHYSICAL EXAMINATION   Vital Signs: Temp:  [97.8 F (36.6 C)-98.4 F (36.9 C)] 97.9 F (36.6 C) (01/01 0600) Pulse Rate:  [62-79] 69 (01/01 0600) Resp:  [15-30] 15 (01/01 0600) BP: (138-202)/(46-108) 149/108 (01/01 0600) SpO2:  [85 %-100 %] 100 % (01/01 0600) Weight:  [64.1 kg-66.5 kg] 64.1 kg (12/31 1846)  GENERAL:Age appropriate mildly encephalopathic , NAD HEAD: Normocephalic, atraumatic.  EYES: Pupils equal, round, reactive to light.  No scleral icterus.  MOUTH: Moist mucosal membrane. NECK: Supple. No thyromegaly. No nodules. No JVD.  PULMONARY: rhonchi and crackles bilaterally CARDIOVASCULAR: S1 and S2. Regular rate and rhythm. No murmurs, rubs, or gallops.  GASTROINTESTINAL: Soft, nontender, non-distended. No masses. Positive bowel sounds. No hepatosplenomegaly.  MUSCULOSKELETAL: No swelling, clubbing, or edema.  NEUROLOGIC: Mild distress due to acute illness SKIN:intact,warm,dry   PERTINENT DATA     Infusions:  anticoagulant sodium citrate     nitroGLYCERIN Stopped (07/23/22 1447)   Scheduled Medications:  amLODipine  10 mg Oral Daily   carvedilol  12.5 mg Oral BID WC   Chlorhexidine Gluconate Cloth  6 each Topical Q0600   cloNIDine  0.1 mg Oral TID   heparin  5,000 Units Subcutaneous Q8H   insulin aspart  0-9 Units Subcutaneous Q4H   PRN Medications: alteplase, anticoagulant sodium citrate, docusate sodium, heparin, hydrALAZINE, lidocaine (PF), lidocaine-prilocaine, pentafluoroprop-tetrafluoroeth, polyethylene glycol Hemodynamic parameters:   Intake/Output: 12/31 0701 - 01/01 0700 In: 120  [P.O.:120] Out: 2400   Ventilator  Settings:      LAB RESULTS:  Basic Metabolic Panel: Recent Labs  Lab 07/23/22 0446 07/23/22 1222  NA 140  --   K 4.7  --   CL 102  --   CO2 25  --   GLUCOSE 192*  --  BUN 55*  --   CREATININE 7.44* 8.66*  CALCIUM 9.0  --   MG 2.3  --     Liver Function Tests: Recent Labs  Lab 07/23/22 0446  AST 22  ALT 18  ALKPHOS 66  BILITOT 0.7  PROT 7.9  ALBUMIN 4.2    No results for input(s): "LIPASE", "AMYLASE" in the last 168 hours. No results for input(s): "AMMONIA" in the last 168 hours. CBC: Recent Labs  Lab 07/23/22 0446 07/23/22 1222  WBC 8.3 6.5  NEUTROABS 6.6  --   HGB 12.3 8.6*  HCT 36.2 25.1*  MCV 95.3 94.7  PLT 101* 90*    Cardiac Enzymes: No results for input(s): "CKTOTAL", "CKMB", "CKMBINDEX", "TROPONINI" in the last 168 hours. BNP: Invalid input(s): "POCBNP" CBG: Recent Labs  Lab 07/23/22 0926 07/23/22 1155 07/23/22 2037 07/24/22 0007 07/24/22 0600  GLUCAP 138* 133* 186* 86 104*        IMAGING RESULTS:  Imaging: DG Chest Portable 1 View  Result Date: 07/23/2022 CLINICAL DATA:  72 year old female with suspected volume overload. End stage renal disease and respiratory distress. EXAM: PORTABLE CHEST 1 VIEW COMPARISON:  Chest radiographs 12/06/2020 and earlier. FINDINGS: Portable AP semi upright view at 0539 hours. Stable lung volumes and mediastinal contours. Calcified aortic atherosclerosis. Borderline to mild cardiomegaly. Visualized tracheal air column is within normal limits. Chronic but increased interstitial markings diffusely, and trace new fluid in the right minor fissure. No definite layering pleural effusions. No pneumothorax or consolidation. Chronic left subclavian vascular stent. Paucity of bowel gas in the visible abdomen. No acute osseous abnormality identified. IMPRESSION: 1. Acute on chronic pulmonary interstitial opacity and new fluid in the right minor fissure compatible with pulmonary  interstitial edema. 2. Aortic Atherosclerosis (ICD10-I70.0). Electronically Signed   By: Genevie Ann M.D.   On: 07/23/2022 05:59   '@PROBHOSP'$ @ No results found.   ASSESSMENT AND PLAN    -Multidisciplinary rounds held today  Acute Hypoxic Respiratory Failure -due to accelerated HTN with development of pulmonary edema  - Repeat TTE on outpatient , most recent Nov 23 with fairly normal function -negative influenza , rsv, covid -patient is on dialysis and s/p HD with excellent recovery  Chronic systolic CHF -TTE 9702 with EF 45% -Lasix as tolerated -follow up cardiac biomarkers as indicated ICU monitoring  Renal Failure-ESRD on HD -follow chem 7 -follow UO S/p-HD- nephrology consult - appreciate input -Left upper extermity fistula  Metabolic encephalopathy- resolved  - re-asess after treatment of accelerated HTN and renal failure with dialysis   Accelerated HTN- resolved    S/p Nitroglycerin drip    - restart po clonidine   - perform HD   - no pain/trauma/tenderness clinically during examination  GI/Nutrition GI PROPHYLAXIS as indicated DIET-->TF's as tolerated Constipation protocol as indicated  ENDO - ICU hypoglycemic\Hyperglycemia protocol -check FSBS per protocol   ELECTROLYTES -follow labs as needed -replace as needed -pharmacy consultation   DVT/GI PRX ordered -SCDs  TRANSFUSIONS AS NEEDED MONITOR FSBS ASSESS the need for LABS as needed    Ottie Glazier, M.D.  Division of [place]

## 2022-07-24 NOTE — Progress Notes (Signed)
[facility]  ROUNDING NOTE   Subjective:   Bethany Lee is a 72 year old female with past medical conditions including hypertension, and end-stage renal disease on hemodialysis.  Patient presents to the emergency department with complaints of shortness of breath and has been admitted for Acute pulmonary edema ([place]) [J81.0] Flash pulmonary edema ([place]) [J81.0] ESRD (end stage renal disease) on dialysis ([place]) [N18.6, Z99.2] Acute hypoxemic respiratory failure ([place]) [J96.01]  Patient is known to our practice and receives outpatient dialysis treatments at [name] on a MWF schedule, supervised by Dr. Candiss Norse.    Patient seen sitting at side of bed No family at bedside Alert and oriented No lower extremity edema, room air  Objective:  Vital signs in last 24 hours:  Temp:  [97.8 F (36.6 C)-98.6 F (37 C)] 98.6 F (37 C) (01/01 0939) Pulse Rate:  [62-79] 72 (01/01 0939) Resp:  [15-30] 18 (01/01 0939) BP: (138-202)/(46-108) 138/92 (01/01 0939) SpO2:  [85 %-100 %] 100 % (01/01 0939) Weight:  [64.1 kg-66.5 kg] 64.1 kg (12/31 1846)  Weight change:  Filed Weights   07/23/22 1457 07/23/22 1846  Weight: 66.5 kg 64.1 kg    Intake/Output: I/O last 3 completed shifts: In: 120 [P.O.:120] Out: 2400 [Other:2400]   Intake/Output this shift:  No intake/output data recorded.  Physical Exam: General: NAD  Head: Normocephalic, atraumatic. Moist oral mucosal membranes  Eyes: Anicteric  Lungs:  Diminished in bases, normal effort, room air  Heart: Regular rate and rhythm  Abdomen:  Soft, nontender, nondistended  Extremities: No peripheral edema.  Neurologic: Alert and oriented, moving all four extremities  Skin: No lesions  Access: Left aVF    Basic Metabolic Panel: Recent Labs  Lab 07/23/22 0446 07/23/22 1222  NA 140  --   K 4.7  --   CL 102  --   CO2 25  --   GLUCOSE 192*  --   BUN 55*  --   CREATININE 7.44* 8.66*  CALCIUM 9.0  --   MG 2.3  --       Liver Function Tests: Recent Labs  Lab 07/23/22 0446  AST 22  ALT 18  ALKPHOS 66  BILITOT 0.7  PROT 7.9  ALBUMIN 4.2    No results for input(s): "LIPASE", "AMYLASE" in the last 168 hours. No results for input(s): "AMMONIA" in the last 168 hours.  CBC: Recent Labs  Lab 07/23/22 0446 07/23/22 1222  WBC 8.3 6.5  NEUTROABS 6.6  --   HGB 12.3 8.6*  HCT 36.2 25.1*  MCV 95.3 94.7  PLT 101* 90*     Cardiac Enzymes: No results for input(s): "CKTOTAL", "CKMB", "CKMBINDEX", "TROPONINI" in the last 168 hours.  BNP: Invalid input(s): "POCBNP"  CBG: Recent Labs  Lab 07/23/22 1155 07/23/22 2037 07/24/22 0007 07/24/22 0600 07/24/22 0901  GLUCAP 133* 186* 86 104* 110*     Microbiology: Results for orders placed or performed during the hospital encounter of 07/23/22  Resp panel by RT-PCR (RSV, Flu A&B, Covid) Anterior Nasal Swab     Status: None   Collection Time: 07/23/22  7:37 AM   Specimen: Anterior Nasal Swab  Result Value Ref Range Status   SARS Coronavirus 2 by RT PCR NEGATIVE NEGATIVE Final    Comment: (NOTE) SARS-CoV-2 target nucleic acids are NOT DETECTED.  The SARS-CoV-2 RNA is generally detectable in upper respiratory specimens during the acute phase of infection. The lowest concentration of SARS-CoV-2 viral copies this assay can detect is 138 copies/mL.  A negative result does not preclude SARS-Cov-2 infection and should not be used as the sole basis for treatment or other patient management decisions. A negative result may occur with  improper specimen collection/handling, submission of specimen other than nasopharyngeal swab, presence of viral mutation(s) within the areas targeted by this assay, and inadequate number of viral copies(<138 copies/mL). A negative result must be combined with clinical observations, patient history, and epidemiological information. The expected result is Negative.  Fact Sheet for Patients:   EntrepreneurPulse.com.au  Fact Sheet for Healthcare Providers:  IncredibleEmployment.be  This test is no t yet approved or cleared by the [place] FDA and  has been authorized for detection and/or diagnosis of SARS-CoV-2 by FDA under an Emergency Use Authorization (EUA). This EUA will remain  in effect (meaning this test can be used) for the duration of the COVID-19 declaration under Section 564(b)(1) of the Act, 21 U.S.C.section 360bbb-3(b)(1), unless the authorization is terminated  or revoked sooner.       Influenza A by PCR NEGATIVE NEGATIVE Final   Influenza B by PCR NEGATIVE NEGATIVE Final    Comment: (NOTE) The Xpert Xpress SARS-CoV-2/FLU/RSV plus assay is intended as an aid in the diagnosis of influenza from Nasopharyngeal swab specimens and should not be used as a sole basis for treatment. Nasal washings and aspirates are unacceptable for Xpert Xpress SARS-CoV-2/FLU/RSV testing.  Fact Sheet for Patients: EntrepreneurPulse.com.au  Fact Sheet for Healthcare Providers: IncredibleEmployment.be  This test is not yet approved or cleared by the [place] FDA and has been authorized for detection and/or diagnosis of SARS-CoV-2 by FDA under an Emergency Use Authorization (EUA). This EUA will remain in effect (meaning this test can be used) for the duration of the COVID-19 declaration under Section 564(b)(1) of the Act, 21 U.S.C. section 360bbb-3(b)(1), unless the authorization is terminated or revoked.     Resp Syncytial Virus by PCR NEGATIVE NEGATIVE Final    Comment: (NOTE) Fact Sheet for Patients: EntrepreneurPulse.com.au  Fact Sheet for Healthcare Providers: IncredibleEmployment.be  This test is not yet approved or cleared by the [place] FDA and has been authorized for detection and/or diagnosis of SARS-CoV-2 by FDA under an Emergency Use  Authorization (EUA). This EUA will remain in effect (meaning this test can be used) for the duration of the COVID-19 declaration under Section 564(b)(1) of the Act, 21 U.S.C. section 360bbb-3(b)(1), unless the authorization is terminated or revoked.  Performed at [facility], [address] [place], [place] [address]   Respiratory (~20 pathogens) panel by PCR     Status: None   Collection Time: 07/23/22 12:22 PM   Specimen: Nasopharyngeal Swab; Respiratory  Result Value Ref Range Status   Adenovirus NOT DETECTED NOT DETECTED Final   Coronavirus 229E NOT DETECTED NOT DETECTED Final    Comment: (NOTE) The Coronavirus on the Respiratory Panel, DOES NOT test for the novel  Coronavirus (2019 nCoV)    Coronavirus HKU1 NOT DETECTED NOT DETECTED Final   Coronavirus NL63 NOT DETECTED NOT DETECTED Final   Coronavirus OC43 NOT DETECTED NOT DETECTED Final   Metapneumovirus NOT DETECTED NOT DETECTED Final   Rhinovirus / Enterovirus NOT DETECTED NOT DETECTED Final   Influenza A NOT DETECTED NOT DETECTED Final   Influenza B NOT DETECTED NOT DETECTED Final   Parainfluenza Virus 1 NOT DETECTED NOT DETECTED Final   Parainfluenza Virus 2 NOT DETECTED NOT DETECTED Final   Parainfluenza Virus 3 NOT DETECTED NOT DETECTED Final   Parainfluenza Virus 4 NOT DETECTED NOT  DETECTED Final   Respiratory Syncytial Virus NOT DETECTED NOT DETECTED Final   Bordetella pertussis NOT DETECTED NOT DETECTED Final   Bordetella Parapertussis NOT DETECTED NOT DETECTED Final   Chlamydophila pneumoniae NOT DETECTED NOT DETECTED Final   Mycoplasma pneumoniae NOT DETECTED NOT DETECTED Final    Comment: Performed at [facility], [place] [address], [place], [place] [address]    Coagulation Studies: No results for input(s): "LABPROT", "INR" in the last 72 hours.  Urinalysis: No results for input(s): "COLORURINE", "LABSPEC", "PHURINE", "GLUCOSEU", "HGBUR", "BILIRUBINUR", "KETONESUR", "PROTEINUR",  "UROBILINOGEN", "NITRITE", "LEUKOCYTESUR" in the last 72 hours.  Invalid input(s): "APPERANCEUR"    Imaging: DG Chest Portable 1 View  Result Date: 07/23/2022 CLINICAL DATA:  72 year old female with suspected volume overload. End stage renal disease and respiratory distress. EXAM: PORTABLE CHEST 1 VIEW COMPARISON:  Chest radiographs 12/06/2020 and earlier. FINDINGS: Portable AP semi upright view at 0539 hours. Stable lung volumes and mediastinal contours. Calcified aortic atherosclerosis. Borderline to mild cardiomegaly. Visualized tracheal air column is within normal limits. Chronic but increased interstitial markings diffusely, and trace new fluid in the right minor fissure. No definite layering pleural effusions. No pneumothorax or consolidation. Chronic left subclavian vascular stent. Paucity of bowel gas in the visible abdomen. No acute osseous abnormality identified. IMPRESSION: 1. Acute on chronic pulmonary interstitial opacity and new fluid in the right minor fissure compatible with pulmonary interstitial edema. 2. Aortic Atherosclerosis (ICD10-I70.0). Electronically Signed   By: Genevie Ann M.D.   On: 07/23/2022 05:59     Medications:    anticoagulant sodium citrate     nitroGLYCERIN Stopped (07/23/22 1447)    amLODipine  10 mg Oral Daily   carvedilol  12.5 mg Oral BID WC   Chlorhexidine Gluconate Cloth  6 each Topical Q0600   cloNIDine  0.1 mg Oral TID   heparin  5,000 Units Subcutaneous Q8H   hydrALAZINE  100 mg Oral BID   insulin aspart  0-9 Units Subcutaneous Q4H   irbesartan  150 mg Oral Daily   alteplase, anticoagulant sodium citrate, docusate sodium, heparin, hydrALAZINE, lidocaine (PF), lidocaine-prilocaine, pentafluoroprop-tetrafluoroeth, polyethylene glycol  Assessment/ Plan:  Ms. Suzetta Timko is a 72 y.o.  female with past medical conditions including hypertension, and end-stage renal disease on hemodialysis.  Patient presents to the emergency department with  complaints of shortness of breath and has been admitted for Acute pulmonary edema ([place]) [J81.0] Flash pulmonary edema ([place]) [J81.0] ESRD (end stage renal disease) on dialysis ([place]) [N18.6, Z99.2] Acute hypoxemic respiratory failure ([place]) [J96.01]  [facility]/MWF/left aVF  Acute respiratory failure with end-stage renal disease on hemodialysis.  Received dialysis yesterday, UF 2.4 L achieved.  Able to wean successfully to room air.  Next dialysis treatment scheduled for Wednesday.  Patient encouraged to closely monitor sodium and fluid intake.  Patient cleared to discharge from renal stance and continue dialysis treatments at outpatient clinic.  2. Anemia of chronic kidney disease Lab Results  Component Value Date   HGB 8.6 (L) 07/23/2022    Hemoglobin below acceptable range.  Will continue to monitor for now..  3. Secondary Hyperparathyroidism: with outpatient labs: PTH 306, phosphorus 8.4, calcium 6.9 on 07/03/22.   Lab Results  Component Value Date   PTH 82 (H) 04/05/2018   CALCIUM 9.0 07/23/2022   PHOS 5.3 (H) 04/09/2018    Will continue to monitor bone minerals during this admission.  Continue calcitriol and calcium  acetate outpatient.  4.  Hypertension with chronic kidney  disease.  Home regimen includes amlodipine, carvedilol, furosemide, hydralazine, and irbesartan.  Initiated on nitroglycerin drip on ED arrival.  Currently receiving amlodipine, carvedilol, clonidine, hydralazine, and irbesartan.  Blood pressure 138/92, acceptable for this patient.   LOS: 1 Auna Mikkelsen 1/1/20249:49 AM

## 2022-07-24 NOTE — Progress Notes (Signed)
[facility]  SUBJECTIVE: Patient sitting on side of the bed, reports doing well, denies chest pain or shortness of breath   Vitals:   07/23/22 2042 07/24/22 0100 07/24/22 0243 07/24/22 0600  BP: (!) 178/75 (!) 149/46 (!) 178/55 (!) 149/108  Pulse: 76 62  69  Resp:  16  15  Temp:    97.9 F (36.6 C)  TempSrc:    Oral  SpO2: 92% 99%  100%  Weight:         Intake/Output Summary (Last 24 hours) at 07/24/2022 0930 Last data filed at 07/23/2022 1835 Gross per 24 hour  Intake 120 ml  Output 2400 ml  Net -2280 ml      PHYSICAL EXAM  General: Well developed, well nourished, in no acute distress HEENT:  Normocephalic and atramatic Neck:  No JVD.  Lungs: Clear bilaterally to auscultation and percussion. Heart: HRRR . Normal S1 and S2 without gallops or murmurs.  Abdomen: Bowel sounds are positive, abdomen soft and non-tender  Msk:  Back normal, normal gait. Normal strength and tone for age. Extremities: No clubbing, cyanosis or edema.   Neuro: Alert and oriented X 3. Psych:  Good affect, responds appropriately   LABS: Basic Metabolic Panel: Recent Labs    07/23/22 0446 07/23/22 1222  NA 140  --   K 4.7  --   CL 102  --   CO2 25  --   GLUCOSE 192*  --   BUN 55*  --   CREATININE 7.44* 8.66*  CALCIUM 9.0  --   MG 2.3  --    Liver Function Tests: Recent Labs    07/23/22 0446  AST 22  ALT 18  ALKPHOS 66  BILITOT 0.7  PROT 7.9  ALBUMIN 4.2   No results for input(s): "LIPASE", "AMYLASE" in the last 72 hours. CBC: Recent Labs    07/23/22 0446 07/23/22 1222  WBC 8.3 6.5  NEUTROABS 6.6  --   HGB 12.3 8.6*  HCT 36.2 25.1*  MCV 95.3 94.7  PLT 101* 90*   Cardiac Enzymes: No results for input(s): "CKTOTAL", "CKMB", "CKMBINDEX", "TROPONINI" in the last 72 hours. BNP: Invalid input(s): "POCBNP" D-Dimer: No results for input(s): "DDIMER" in the last 72 hours. Hemoglobin A1C: No results for input(s): "HGBA1C" in the last 72 hours. Fasting Lipid Panel: No  results for input(s): "CHOL", "HDL", "LDLCALC", "TRIG", "CHOLHDL", "LDLDIRECT" in the last 72 hours. Thyroid Function Tests: No results for input(s): "TSH", "T4TOTAL", "T3FREE", "THYROIDAB" in the last 72 hours.  Invalid input(s): "FREET3" Anemia Panel: No results for input(s): "VITAMINB12", "FOLATE", "FERRITIN", "TIBC", "IRON", "RETICCTPCT" in the last 72 hours.  DG Chest Portable 1 View  Result Date: 07/23/2022 CLINICAL DATA:  72 year old female with suspected volume overload. End stage renal disease and respiratory distress. EXAM: PORTABLE CHEST 1 VIEW COMPARISON:  Chest radiographs 12/06/2020 and earlier. FINDINGS: Portable AP semi upright view at 0539 hours. Stable lung volumes and mediastinal contours. Calcified aortic atherosclerosis. Borderline to mild cardiomegaly. Visualized tracheal air column is within normal limits. Chronic but increased interstitial markings diffusely, and trace new fluid in the right minor fissure. No definite layering pleural effusions. No pneumothorax or consolidation. Chronic left subclavian vascular stent. Paucity of bowel gas in the visible abdomen. No acute osseous abnormality identified. IMPRESSION: 1. Acute on chronic pulmonary interstitial opacity and new fluid in the right minor fissure compatible with pulmonary interstitial edema. 2. Aortic Atherosclerosis (ICD10-I70.0). Electronically Signed   By: Genevie Ann M.D.   On: 07/23/2022 05:59  Echo   TELEMETRY: Sinus rhythm:  ASSESSMENT AND PLAN:  Principal Problem:   Flash pulmonary edema (HCC) Active Problems:   Acute hypoxemic respiratory failure (HCC)    1.  Pulmonary edema, in patient with known end-stage renal disease on chronic hemodialysis, with recent 2D echocardiogram 06/06/2022 revealed normal left ventricular function, with mild aortic stenosis, clinically improved after hemodialysis 07/23/2022 2.  Coronary artery disease, status post DES mid RCA 09/05/2016, without chest pain, with  borderline elevated high-sensitivity troponin 24, 38, likely chronic elevation in the setting of end-stage renal disease 3.  Elevated blood pressure, on amlodipine, carvedilol, clonidine, hydralazine, and furosemide, much improved after dialysis   Recommendations   1.  Agree with current therapy 2.  Continue carvedilol 12.5 mg twice daily, uptitrate as needed 2.  Continue amlodipine 10 mg daily 3.  Continue clonidine 0.1 mg 3 times daily 4.  Continue hydralazine 100 mg twice daily 5.  Continue irbesartan 150 mg daily 6.  Defer further cardiac diagnostics at this time 7.  May follow-up with me in 1 to 2 weeks   Isaias Cowman, MD, PhD, [facility] 07/24/2022 9:30 AM

## 2022-07-24 NOTE — Discharge Summary (Signed)
Physician Discharge Summary  Patient ID: Bethany Lee MRN: [id number] DOB/AGE: Nov 30, 1950 72 y.o.  Admit date: 07/23/2022 Discharge date: 07/24/2022    Discharge Diagnoses:                                       Acute hypoxic respiratory failure secondary to acute on chronic                                       systolic CHF exacerbation                                       Accelerated HTN                                       ESRD on HD~MWF                                       Coronary artery disease                                       Hypercholesterolemia                                       Hypothyroidism                                      Anemia of CKD                                       Type II diabetes mellitus                                                                        DISCHARGE PLAN BY DIAGNOSIS    HTN  CAD Hypercholesterolemia  Acute on chronic diastolic CHF  Plan: Follow-up with outpatient Cardiologist and PCP in 1-2 weeks  Continue amlodipine, carvedilol, furosemide, lovastatin, hydralazine, and irbesartan  Follow low sodium diet  Return to the ER if having chest pain and/or shortness of breath   ESRD on HD~MWF Plan: Continue hemodialysis as scheduled MWF Continue outpatient sevelamer carbonate and calcium acetate   Hypothyroidism Plan: Continue outpatient levothyroxine   Anemia of chronic kidney disease Continue outpatient iron polysaccharides   Type II diabetes mellitus  Continue outpatient toujeo solostar  DISCHARGE SUMMARY   Bethany Lee is a 72 y.o. y/o female with a PMH of f CKD/ESRD on HD MWF and essential HTN. She presented to [facility] ER on 07/23/22 with shortness of breath. She stated she was unable to get her HD done due to holiday although she was confused upon presentation.  Pts last hemodialysis session was 07/21/22.  It was reported the pt did eat some spiral ham for dinner on 07/22/22.  She was noted to have hypoxemia on arrival in the ED O2 sats were 85% on room air. She was also noted to have accelerated HTN and placed on nitroglycerin drip.  She had chest imaging with findings of acute on chronic pulmonary interstitial opacity and new fluid in the right minor fissure secondary to pulmonary interstitial edema.  She required continuous Bipap initially.  Her BNP was markedly elevated higher then all other previous measurements in past 6 years >2200. In 2017 she had TTE with EF 45% and she had 75% stenosis of distal RCA and had DES done in 2018 with [facility] cardio. Nephrology consulted 07/23/22 and pt underwent hemodialysis session with 2.4L of fluid removed.  Nitroglycerin gtt discontinued on 07/23/22.  Cardiology consulted and recommended resuming outpatient cardiac medications, no further cardiac diagnostics recommended.  Cardiology recommended follow-up in the outpatient setting.  On 07/24/2021 respiratory status significantly improved pt at baseline and requesting to go home.   SIGNIFICANT DIAGNOSTIC STUDIES: None   SIGNIFICANT EVENTS None   MICRO DATA  Respiratory (~20 pathogens) panel by PCR 07/23/22>>negative  Respiratory panel by RT-PCR 07/23/22>>negative   ANTIBIOTICS None   CONSULTS Nephrology  Cardiology   Discharge Exam: General: Well-developed/well-nourished female, resting in bed, in NAD. Neuro: A&O x 3, non-focal.  HEENT: [place]/AT. PERRLA Cardiovascular: NSR,RRR, no M/R/G.  Lungs: Respirations even and unlabored.  CTA bilaterally, No W/R/R.  Abdomen: BS x 4, soft, NT/ND.  Musculoskeletal: No gross deformities, no edema.  Skin: Left upper extremity fistula +bruit/thrill.  No rashes or lesions present    Vitals:   07/24/22 0100 07/24/22 0243 07/24/22 0600 07/24/22 0939  BP: (!) 149/46 (!) 178/55 (!) 149/108 (!) 138/92  Pulse: 62  69 72  Resp: _0 Temp:   97.9 F (36.6 C) 98.6 F (37 C)  TempSrc:   Oral Oral  SpO2: 99%  100% 100%  Weight:          Discharge Labs  BMET Recent Labs  Lab 07/23/22 0446 07/23/22 1222  NA 140  --   K 4.7  --   CL 102  --   CO2 25  --   GLUCOSE 192*  --   BUN 55*  --   CREATININE 7.44* 8.66*  CALCIUM 9.0  --   MG 2.3  --     CBC Recent Labs  Lab 07/23/22 0446 07/23/22 1222  HGB 12.3 8.6*  HCT 36.2 25.1*  WBC 8.3 6.5  PLT 101* 90*    Anti-Coagulation No results for input(s): "INR" in the last 168 hours.        Allergies as of 07/24/2022       Reactions   Oxycodone Nausea And Vomiting   Per Pt cannot tolerate oxycodone   Penicillin G    Valacyclovir Nausea Only        Medication List     TAKE these medications    amLODipine 10 MG tablet Commonly known as: NORVASC Take 1 tablet (10 mg total) by mouth daily.   aspirin  EC 81 MG tablet Take 1 tablet (81 mg total) by mouth daily. Start taking after NOV 15 What changed: additional instructions   calcitRIOL 0.5 MCG capsule Commonly known as: ROCALTROL Take 0.5 mcg by mouth daily.   calcium acetate 667 MG capsule Commonly known as: PHOSLO Take 2,001 mg by mouth 3 (three) times daily with meals.   carvedilol 12.5 MG tablet Commonly known as: COREG Take 12.5 mg by mouth 2 (two) times daily with a meal.   Dialyvite 800/Ultra D Tabs Take 1 tablet by mouth daily.   furosemide 80 MG tablet Commonly known as: LASIX Take 80 mg by mouth daily.   hydrALAZINE 100 MG tablet Commonly known as: APRESOLINE Take 1 tablet (100 mg total) by mouth 2 (two) times daily.   insulin aspart 100 UNIT/ML FlexPen Commonly known as: NOVOLOG Inject 4-8 Units into the skin 3 (three) times daily with meals. Sliding scale  Below 200 take 4 units 200-250=6 units  >250= 8 units   irbesartan 150 MG tablet Commonly known as: AVAPRO Take 1 tablet (150 mg total) by mouth daily.   iron polysaccharides 150 MG capsule Commonly known as: NIFEREX Take 150 mg by mouth daily.   levothyroxine 112 MCG tablet Commonly known as:  SYNTHROID Take 112 mcg by mouth daily before breakfast.   Lidocaine (Anorectal) 5 % Crea Apply 1 application topically as needed.   lovastatin 40 MG tablet Commonly known as: MEVACOR Take 40 mg by mouth at bedtime.   pantoprazole 40 MG tablet Commonly known as: Protonix Take 1 tablet (40 mg total) by mouth 2 (two) times daily. What changed: when to take this   sevelamer carbonate 800 MG tablet Commonly known as: RENVELA Take 1,600 mg by mouth 3 (three) times daily with meals.   Toujeo SoloStar 300 UNIT/ML Solostar Pen Generic drug: insulin glargine (1 Unit Dial) Inject 24 Units into the skin Nightly.   urea 20 % cream Commonly known as: CARMOL Apply 1 application topically daily as needed (for dry skin).         Disposition: Home   Discharged Condition: Bethany Lee has met maximum benefit of inpatient care and is medically stable and cleared for discharge.  Patient is pending follow up as above.     Donell Beers, [place] Pager [phone] (please enter 7 digits) PCCM Consult Pager [phone] (please enter 7 digits)

## 2022-07-25 LAB — HEMOGLOBIN A1C
Hgb A1c MFr Bld: 5.9 % — ABNORMAL HIGH (ref 4.8–5.6)
Mean Plasma Glucose: 123 mg/dL

## 2022-07-25 LAB — HEPATITIS B E ANTIBODY: Hep B E Ab: POSITIVE — AB

## 2022-07-25 LAB — HEPATITIS B SURFACE ANTIBODY, QUANTITATIVE: Hep B S AB Quant (Post): 224.4 m[IU]/mL (ref >9.9)

## 2022-07-26 DIAGNOSIS — Z992 Dependence on renal dialysis: Secondary | ICD-10-CM | POA: Diagnosis not present

## 2022-07-26 DIAGNOSIS — N2581 Secondary hyperparathyroidism of renal origin: Secondary | ICD-10-CM | POA: Diagnosis not present

## 2022-07-26 DIAGNOSIS — N186 End stage renal disease: Secondary | ICD-10-CM | POA: Diagnosis not present

## 2022-07-27 DIAGNOSIS — I5022 Chronic systolic (congestive) heart failure: Secondary | ICD-10-CM | POA: Diagnosis not present

## 2022-07-27 DIAGNOSIS — Z09 Encounter for follow-up examination after completed treatment for conditions other than malignant neoplasm: Secondary | ICD-10-CM | POA: Diagnosis not present

## 2022-07-27 DIAGNOSIS — I132 Hypertensive heart and chronic kidney disease with heart failure and with stage 5 chronic kidney disease, or end stage renal disease: Secondary | ICD-10-CM | POA: Diagnosis not present

## 2022-07-27 DIAGNOSIS — N186 End stage renal disease: Secondary | ICD-10-CM | POA: Diagnosis not present

## 2022-07-28 DIAGNOSIS — N186 End stage renal disease: Secondary | ICD-10-CM | POA: Diagnosis not present

## 2022-07-28 DIAGNOSIS — Z992 Dependence on renal dialysis: Secondary | ICD-10-CM | POA: Diagnosis not present

## 2022-07-28 DIAGNOSIS — N2581 Secondary hyperparathyroidism of renal origin: Secondary | ICD-10-CM | POA: Diagnosis not present

## 2022-07-31 DIAGNOSIS — N2581 Secondary hyperparathyroidism of renal origin: Secondary | ICD-10-CM | POA: Diagnosis not present

## 2022-07-31 DIAGNOSIS — Z992 Dependence on renal dialysis: Secondary | ICD-10-CM | POA: Diagnosis not present

## 2022-07-31 DIAGNOSIS — N186 End stage renal disease: Secondary | ICD-10-CM | POA: Diagnosis not present

## 2022-07-31 DIAGNOSIS — Z794 Long term (current) use of insulin: Secondary | ICD-10-CM | POA: Diagnosis not present

## 2022-07-31 DIAGNOSIS — E119 Type 2 diabetes mellitus without complications: Secondary | ICD-10-CM | POA: Diagnosis not present

## 2022-08-02 DIAGNOSIS — N2581 Secondary hyperparathyroidism of renal origin: Secondary | ICD-10-CM | POA: Diagnosis not present

## 2022-08-02 DIAGNOSIS — Z992 Dependence on renal dialysis: Secondary | ICD-10-CM | POA: Diagnosis not present

## 2022-08-02 DIAGNOSIS — N186 End stage renal disease: Secondary | ICD-10-CM | POA: Diagnosis not present

## 2022-08-04 DIAGNOSIS — Z992 Dependence on renal dialysis: Secondary | ICD-10-CM | POA: Diagnosis not present

## 2022-08-04 DIAGNOSIS — N2581 Secondary hyperparathyroidism of renal origin: Secondary | ICD-10-CM | POA: Diagnosis not present

## 2022-08-04 DIAGNOSIS — N186 End stage renal disease: Secondary | ICD-10-CM | POA: Diagnosis not present

## 2022-08-05 ENCOUNTER — Encounter: Admit: 2022-08-05 | Discharge: 2022-08-05 | Payer: MEDICARE | Attending: Surgery | Primary: Surgery

## 2022-08-06 ENCOUNTER — Ambulatory Visit: Admit: 2022-08-06 | Payer: MEDICARE

## 2022-08-06 ENCOUNTER — Encounter: Admit: 2022-08-06 | Payer: MEDICARE | Attending: Student in an Organized Health Care Education/Training Program

## 2022-08-06 ENCOUNTER — Ambulatory Visit
Admit: 2022-08-06 | Discharge: 2022-08-20 | Disposition: A | Discharge status: Home Health | Payer: MEDICARE | Admitting: Surgery

## 2022-08-06 ENCOUNTER — Encounter: Admit: 2022-08-06 | Payer: MEDICARE | Attending: Surgery

## 2022-08-06 ENCOUNTER — Encounter
Admit: 2022-08-06 | Discharge: 2022-08-20 | Disposition: A | Discharge status: Home Health | Payer: MEDICARE | Attending: Surgery | Admitting: Surgery

## 2022-08-06 ENCOUNTER — Ambulatory Visit: Admit: 2022-08-06 | Discharge: 2022-08-20 | Payer: MEDICARE

## 2022-08-06 DIAGNOSIS — R410 Disorientation, unspecified: Secondary | ICD-10-CM | POA: Diagnosis not present

## 2022-08-06 DIAGNOSIS — E1165 Type 2 diabetes mellitus with hyperglycemia: Secondary | ICD-10-CM | POA: Diagnosis not present

## 2022-08-06 DIAGNOSIS — T8619 Other complication of kidney transplant: Secondary | ICD-10-CM | POA: Diagnosis not present

## 2022-08-06 DIAGNOSIS — J811 Chronic pulmonary edema: Secondary | ICD-10-CM | POA: Diagnosis not present

## 2022-08-06 DIAGNOSIS — T380X5A Adverse effect of glucocorticoids and synthetic analogues, initial encounter: Secondary | ICD-10-CM | POA: Diagnosis not present

## 2022-08-06 DIAGNOSIS — R4182 Altered mental status, unspecified: Secondary | ICD-10-CM | POA: Diagnosis not present

## 2022-08-06 DIAGNOSIS — F05 Delirium due to known physiological condition: Secondary | ICD-10-CM | POA: Diagnosis not present

## 2022-08-06 DIAGNOSIS — R0902 Hypoxemia: Secondary | ICD-10-CM | POA: Diagnosis not present

## 2022-08-06 DIAGNOSIS — D631 Anemia in chronic kidney disease: Secondary | ICD-10-CM | POA: Diagnosis not present

## 2022-08-06 DIAGNOSIS — Z8679 Personal history of other diseases of the circulatory system: Secondary | ICD-10-CM | POA: Diagnosis not present

## 2022-08-06 DIAGNOSIS — Z79899 Other long term (current) drug therapy: Secondary | ICD-10-CM | POA: Diagnosis not present

## 2022-08-06 DIAGNOSIS — E039 Hypothyroidism, unspecified: Secondary | ICD-10-CM | POA: Diagnosis not present

## 2022-08-06 DIAGNOSIS — I120 Hypertensive chronic kidney disease with stage 5 chronic kidney disease or end stage renal disease: Secondary | ICD-10-CM | POA: Diagnosis not present

## 2022-08-06 DIAGNOSIS — Z9181 History of falling: Secondary | ICD-10-CM | POA: Diagnosis not present

## 2022-08-06 DIAGNOSIS — Z9289 Personal history of other medical treatment: Secondary | ICD-10-CM | POA: Diagnosis not present

## 2022-08-06 DIAGNOSIS — R7989 Other specified abnormal findings of blood chemistry: Secondary | ICD-10-CM | POA: Diagnosis not present

## 2022-08-06 DIAGNOSIS — E1122 Type 2 diabetes mellitus with diabetic chronic kidney disease: Secondary | ICD-10-CM | POA: Diagnosis not present

## 2022-08-06 DIAGNOSIS — Z794 Long term (current) use of insulin: Secondary | ICD-10-CM | POA: Diagnosis not present

## 2022-08-06 DIAGNOSIS — R609 Edema, unspecified: Secondary | ICD-10-CM | POA: Diagnosis not present

## 2022-08-06 DIAGNOSIS — R110 Nausea: Secondary | ICD-10-CM | POA: Diagnosis not present

## 2022-08-06 DIAGNOSIS — N2581 Secondary hyperparathyroidism of renal origin: Secondary | ICD-10-CM | POA: Diagnosis not present

## 2022-08-06 DIAGNOSIS — I10 Essential (primary) hypertension: Secondary | ICD-10-CM | POA: Diagnosis not present

## 2022-08-06 DIAGNOSIS — R768 Other specified abnormal immunological findings in serum: Secondary | ICD-10-CM | POA: Diagnosis not present

## 2022-08-06 DIAGNOSIS — D62 Acute posthemorrhagic anemia: Secondary | ICD-10-CM | POA: Diagnosis not present

## 2022-08-06 DIAGNOSIS — I2510 Atherosclerotic heart disease of native coronary artery without angina pectoris: Secondary | ICD-10-CM | POA: Diagnosis not present

## 2022-08-06 DIAGNOSIS — Z01818 Encounter for other preprocedural examination: Secondary | ICD-10-CM | POA: Diagnosis not present

## 2022-08-06 DIAGNOSIS — E785 Hyperlipidemia, unspecified: Secondary | ICD-10-CM | POA: Diagnosis not present

## 2022-08-06 DIAGNOSIS — R400 Somnolence: Secondary | ICD-10-CM | POA: Diagnosis not present

## 2022-08-06 DIAGNOSIS — Z4682 Encounter for fitting and adjustment of non-vascular catheter: Secondary | ICD-10-CM | POA: Diagnosis not present

## 2022-08-06 DIAGNOSIS — R451 Restlessness and agitation: Secondary | ICD-10-CM | POA: Diagnosis not present

## 2022-08-06 DIAGNOSIS — G479 Sleep disorder, unspecified: Secondary | ICD-10-CM | POA: Diagnosis not present

## 2022-08-06 DIAGNOSIS — Z9151 Personal history of suicidal behavior: Secondary | ICD-10-CM | POA: Diagnosis not present

## 2022-08-06 DIAGNOSIS — R2689 Other abnormalities of gait and mobility: Secondary | ICD-10-CM | POA: Diagnosis not present

## 2022-08-06 DIAGNOSIS — E871 Hypo-osmolality and hyponatremia: Secondary | ICD-10-CM | POA: Diagnosis not present

## 2022-08-06 DIAGNOSIS — N186 End stage renal disease: Secondary | ICD-10-CM | POA: Diagnosis not present

## 2022-08-06 DIAGNOSIS — E222 Syndrome of inappropriate secretion of antidiuretic hormone: Secondary | ICD-10-CM | POA: Diagnosis not present

## 2022-08-06 DIAGNOSIS — Z942 Lung transplant status: Secondary | ICD-10-CM | POA: Diagnosis not present

## 2022-08-06 DIAGNOSIS — D84821 Immunodeficiency due to drugs: Secondary | ICD-10-CM | POA: Diagnosis not present

## 2022-08-06 DIAGNOSIS — Z940 Kidney transplant status: Secondary | ICD-10-CM | POA: Diagnosis not present

## 2022-08-06 DIAGNOSIS — E669 Obesity, unspecified: Secondary | ICD-10-CM | POA: Diagnosis not present

## 2022-08-06 DIAGNOSIS — Z79621 Long term (current) use of calcineurin inhibitor: Secondary | ICD-10-CM | POA: Diagnosis not present

## 2022-08-06 DIAGNOSIS — R52 Pain, unspecified: Secondary | ICD-10-CM | POA: Diagnosis not present

## 2022-08-06 DIAGNOSIS — R011 Cardiac murmur, unspecified: Secondary | ICD-10-CM | POA: Diagnosis not present

## 2022-08-06 DIAGNOSIS — Z992 Dependence on renal dialysis: Secondary | ICD-10-CM | POA: Diagnosis not present

## 2022-08-06 DIAGNOSIS — J90 Pleural effusion, not elsewhere classified: Secondary | ICD-10-CM | POA: Diagnosis not present

## 2022-08-06 DIAGNOSIS — B3749 Other urogenital candidiasis: Secondary | ICD-10-CM | POA: Diagnosis not present

## 2022-08-06 DIAGNOSIS — D649 Anemia, unspecified: Secondary | ICD-10-CM | POA: Diagnosis not present

## 2022-08-06 DIAGNOSIS — Z5181 Encounter for therapeutic drug level monitoring: Secondary | ICD-10-CM | POA: Diagnosis not present

## 2022-08-06 DIAGNOSIS — R918 Other nonspecific abnormal finding of lung field: Secondary | ICD-10-CM | POA: Diagnosis not present

## 2022-08-06 DIAGNOSIS — Z789 Other specified health status: Secondary | ICD-10-CM | POA: Diagnosis not present

## 2022-08-06 DIAGNOSIS — I082 Rheumatic disorders of both aortic and tricuspid valves: Secondary | ICD-10-CM | POA: Diagnosis not present

## 2022-08-06 DIAGNOSIS — E119 Type 2 diabetes mellitus without complications: Secondary | ICD-10-CM | POA: Diagnosis not present

## 2022-08-07 DIAGNOSIS — D84821 Immunodeficiency due to drugs: Secondary | ICD-10-CM | POA: Diagnosis not present

## 2022-08-07 DIAGNOSIS — I10 Essential (primary) hypertension: Secondary | ICD-10-CM | POA: Diagnosis not present

## 2022-08-07 DIAGNOSIS — Z79621 Long term (current) use of calcineurin inhibitor: Secondary | ICD-10-CM | POA: Diagnosis not present

## 2022-08-07 DIAGNOSIS — Z940 Kidney transplant status: Secondary | ICD-10-CM | POA: Diagnosis not present

## 2022-08-08 DIAGNOSIS — I10 Essential (primary) hypertension: Secondary | ICD-10-CM | POA: Diagnosis not present

## 2022-08-08 DIAGNOSIS — R400 Somnolence: Secondary | ICD-10-CM | POA: Diagnosis not present

## 2022-08-08 DIAGNOSIS — R410 Disorientation, unspecified: Secondary | ICD-10-CM | POA: Diagnosis not present

## 2022-08-08 DIAGNOSIS — Z9151 Personal history of suicidal behavior: Secondary | ICD-10-CM | POA: Diagnosis not present

## 2022-08-08 DIAGNOSIS — D84821 Immunodeficiency due to drugs: Secondary | ICD-10-CM | POA: Diagnosis not present

## 2022-08-08 DIAGNOSIS — R4182 Altered mental status, unspecified: Secondary | ICD-10-CM | POA: Diagnosis not present

## 2022-08-08 DIAGNOSIS — N186 End stage renal disease: Secondary | ICD-10-CM | POA: Diagnosis not present

## 2022-08-08 DIAGNOSIS — Z992 Dependence on renal dialysis: Secondary | ICD-10-CM | POA: Diagnosis not present

## 2022-08-08 DIAGNOSIS — E669 Obesity, unspecified: Secondary | ICD-10-CM | POA: Diagnosis not present

## 2022-08-08 DIAGNOSIS — N2581 Secondary hyperparathyroidism of renal origin: Secondary | ICD-10-CM | POA: Diagnosis not present

## 2022-08-08 DIAGNOSIS — E1165 Type 2 diabetes mellitus with hyperglycemia: Secondary | ICD-10-CM | POA: Diagnosis not present

## 2022-08-08 DIAGNOSIS — Z794 Long term (current) use of insulin: Secondary | ICD-10-CM | POA: Diagnosis not present

## 2022-08-08 DIAGNOSIS — J811 Chronic pulmonary edema: Secondary | ICD-10-CM | POA: Diagnosis not present

## 2022-08-08 DIAGNOSIS — R451 Restlessness and agitation: Secondary | ICD-10-CM | POA: Diagnosis not present

## 2022-08-08 DIAGNOSIS — Z79621 Long term (current) use of calcineurin inhibitor: Secondary | ICD-10-CM | POA: Diagnosis not present

## 2022-08-08 DIAGNOSIS — J90 Pleural effusion, not elsewhere classified: Secondary | ICD-10-CM | POA: Diagnosis not present

## 2022-08-08 DIAGNOSIS — B3749 Other urogenital candidiasis: Secondary | ICD-10-CM | POA: Diagnosis not present

## 2022-08-08 DIAGNOSIS — T380X5A Adverse effect of glucocorticoids and synthetic analogues, initial encounter: Secondary | ICD-10-CM | POA: Diagnosis not present

## 2022-08-08 DIAGNOSIS — R0902 Hypoxemia: Secondary | ICD-10-CM | POA: Diagnosis not present

## 2022-08-08 DIAGNOSIS — R110 Nausea: Secondary | ICD-10-CM | POA: Diagnosis not present

## 2022-08-08 DIAGNOSIS — Z940 Kidney transplant status: Secondary | ICD-10-CM | POA: Diagnosis not present

## 2022-08-08 DIAGNOSIS — G479 Sleep disorder, unspecified: Secondary | ICD-10-CM | POA: Diagnosis not present

## 2022-08-08 MED ORDER — VALGANCICLOVIR 450 MG TABLET
ORAL_TABLET | Freq: Every day | ORAL | 2 refills | 30 days | Status: CP
Start: 2022-08-08 — End: 2022-11-06

## 2022-08-08 MED ORDER — MYCOPHENOLATE MOFETIL 250 MG CAPSULE
ORAL_CAPSULE | Freq: Two times a day (BID) | ORAL | 11 refills | 30 days | Status: CP
Start: 2022-08-08 — End: 2023-08-08

## 2022-08-08 MED ORDER — TACROLIMUS 1 MG CAPSULE, IMMEDIATE-RELEASE
ORAL_CAPSULE | Freq: Two times a day (BID) | ORAL | 11 refills | 30 days | Status: CP
Start: 2022-08-08 — End: 2023-08-08

## 2022-08-09 DIAGNOSIS — T380X5A Adverse effect of glucocorticoids and synthetic analogues, initial encounter: Secondary | ICD-10-CM | POA: Diagnosis not present

## 2022-08-09 DIAGNOSIS — E1165 Type 2 diabetes mellitus with hyperglycemia: Secondary | ICD-10-CM | POA: Diagnosis not present

## 2022-08-09 DIAGNOSIS — R4182 Altered mental status, unspecified: Secondary | ICD-10-CM | POA: Diagnosis not present

## 2022-08-09 DIAGNOSIS — Z794 Long term (current) use of insulin: Secondary | ICD-10-CM | POA: Diagnosis not present

## 2022-08-09 DIAGNOSIS — J811 Chronic pulmonary edema: Secondary | ICD-10-CM | POA: Diagnosis not present

## 2022-08-09 DIAGNOSIS — R7989 Other specified abnormal findings of blood chemistry: Secondary | ICD-10-CM | POA: Diagnosis not present

## 2022-08-09 DIAGNOSIS — E669 Obesity, unspecified: Secondary | ICD-10-CM | POA: Diagnosis not present

## 2022-08-09 DIAGNOSIS — R0902 Hypoxemia: Secondary | ICD-10-CM | POA: Diagnosis not present

## 2022-08-09 DIAGNOSIS — Z79621 Long term (current) use of calcineurin inhibitor: Secondary | ICD-10-CM | POA: Diagnosis not present

## 2022-08-09 DIAGNOSIS — D84821 Immunodeficiency due to drugs: Secondary | ICD-10-CM | POA: Diagnosis not present

## 2022-08-09 DIAGNOSIS — B3749 Other urogenital candidiasis: Secondary | ICD-10-CM | POA: Diagnosis not present

## 2022-08-09 DIAGNOSIS — G479 Sleep disorder, unspecified: Secondary | ICD-10-CM | POA: Diagnosis not present

## 2022-08-09 DIAGNOSIS — Z940 Kidney transplant status: Secondary | ICD-10-CM | POA: Diagnosis not present

## 2022-08-09 DIAGNOSIS — R410 Disorientation, unspecified: Secondary | ICD-10-CM | POA: Diagnosis not present

## 2022-08-10 DIAGNOSIS — R768 Other specified abnormal immunological findings in serum: Secondary | ICD-10-CM | POA: Diagnosis not present

## 2022-08-10 DIAGNOSIS — T380X5A Adverse effect of glucocorticoids and synthetic analogues, initial encounter: Secondary | ICD-10-CM | POA: Diagnosis not present

## 2022-08-10 DIAGNOSIS — R4182 Altered mental status, unspecified: Secondary | ICD-10-CM | POA: Diagnosis not present

## 2022-08-10 DIAGNOSIS — R410 Disorientation, unspecified: Secondary | ICD-10-CM | POA: Diagnosis not present

## 2022-08-10 DIAGNOSIS — R451 Restlessness and agitation: Secondary | ICD-10-CM | POA: Diagnosis not present

## 2022-08-10 DIAGNOSIS — E1165 Type 2 diabetes mellitus with hyperglycemia: Secondary | ICD-10-CM | POA: Diagnosis not present

## 2022-08-10 DIAGNOSIS — Z79621 Long term (current) use of calcineurin inhibitor: Secondary | ICD-10-CM | POA: Diagnosis not present

## 2022-08-10 DIAGNOSIS — Z940 Kidney transplant status: Secondary | ICD-10-CM | POA: Diagnosis not present

## 2022-08-10 DIAGNOSIS — Z794 Long term (current) use of insulin: Secondary | ICD-10-CM | POA: Diagnosis not present

## 2022-08-10 DIAGNOSIS — G479 Sleep disorder, unspecified: Secondary | ICD-10-CM | POA: Diagnosis not present

## 2022-08-10 DIAGNOSIS — D84821 Immunodeficiency due to drugs: Secondary | ICD-10-CM | POA: Diagnosis not present

## 2022-08-10 DIAGNOSIS — B3749 Other urogenital candidiasis: Secondary | ICD-10-CM | POA: Diagnosis not present

## 2022-08-10 DIAGNOSIS — E669 Obesity, unspecified: Secondary | ICD-10-CM | POA: Diagnosis not present

## 2022-08-11 DIAGNOSIS — R410 Disorientation, unspecified: Secondary | ICD-10-CM | POA: Diagnosis not present

## 2022-08-11 DIAGNOSIS — E1165 Type 2 diabetes mellitus with hyperglycemia: Secondary | ICD-10-CM | POA: Diagnosis not present

## 2022-08-11 DIAGNOSIS — I2510 Atherosclerotic heart disease of native coronary artery without angina pectoris: Secondary | ICD-10-CM | POA: Diagnosis not present

## 2022-08-11 DIAGNOSIS — E785 Hyperlipidemia, unspecified: Secondary | ICD-10-CM | POA: Diagnosis not present

## 2022-08-11 DIAGNOSIS — D62 Acute posthemorrhagic anemia: Secondary | ICD-10-CM | POA: Diagnosis not present

## 2022-08-11 DIAGNOSIS — Z794 Long term (current) use of insulin: Secondary | ICD-10-CM | POA: Diagnosis not present

## 2022-08-11 DIAGNOSIS — N186 End stage renal disease: Secondary | ICD-10-CM | POA: Diagnosis not present

## 2022-08-11 DIAGNOSIS — E1122 Type 2 diabetes mellitus with diabetic chronic kidney disease: Secondary | ICD-10-CM | POA: Diagnosis not present

## 2022-08-11 DIAGNOSIS — F05 Delirium due to known physiological condition: Secondary | ICD-10-CM | POA: Diagnosis not present

## 2022-08-11 DIAGNOSIS — Z940 Kidney transplant status: Secondary | ICD-10-CM | POA: Diagnosis not present

## 2022-08-11 DIAGNOSIS — E039 Hypothyroidism, unspecified: Secondary | ICD-10-CM | POA: Diagnosis not present

## 2022-08-11 DIAGNOSIS — T380X5A Adverse effect of glucocorticoids and synthetic analogues, initial encounter: Secondary | ICD-10-CM | POA: Diagnosis not present

## 2022-08-11 DIAGNOSIS — E669 Obesity, unspecified: Secondary | ICD-10-CM | POA: Diagnosis not present

## 2022-08-11 DIAGNOSIS — I120 Hypertensive chronic kidney disease with stage 5 chronic kidney disease or end stage renal disease: Secondary | ICD-10-CM | POA: Diagnosis not present

## 2022-08-12 DIAGNOSIS — Z940 Kidney transplant status: Secondary | ICD-10-CM | POA: Diagnosis not present

## 2022-08-13 DIAGNOSIS — Z940 Kidney transplant status: Secondary | ICD-10-CM | POA: Diagnosis not present

## 2022-08-14 DIAGNOSIS — R768 Other specified abnormal immunological findings in serum: Secondary | ICD-10-CM | POA: Diagnosis not present

## 2022-08-14 DIAGNOSIS — B3749 Other urogenital candidiasis: Secondary | ICD-10-CM | POA: Diagnosis not present

## 2022-08-14 DIAGNOSIS — Z940 Kidney transplant status: Secondary | ICD-10-CM | POA: Diagnosis not present

## 2022-08-14 MED ORDER — LETERMOVIR 480 MG TABLET
ORAL_TABLET | ORAL | 0 refills | 0 days
Start: 2022-08-14 — End: 2022-08-14

## 2022-08-17 DIAGNOSIS — R52 Pain, unspecified: Secondary | ICD-10-CM | POA: Diagnosis not present

## 2022-08-17 DIAGNOSIS — Z9289 Personal history of other medical treatment: Secondary | ICD-10-CM | POA: Diagnosis not present

## 2022-08-17 DIAGNOSIS — N186 End stage renal disease: Secondary | ICD-10-CM | POA: Diagnosis not present

## 2022-08-17 DIAGNOSIS — Z8679 Personal history of other diseases of the circulatory system: Secondary | ICD-10-CM | POA: Diagnosis not present

## 2022-08-17 DIAGNOSIS — Z789 Other specified health status: Secondary | ICD-10-CM | POA: Diagnosis not present

## 2022-08-17 DIAGNOSIS — Z9181 History of falling: Secondary | ICD-10-CM | POA: Diagnosis not present

## 2022-08-17 DIAGNOSIS — R2689 Other abnormalities of gait and mobility: Secondary | ICD-10-CM | POA: Diagnosis not present

## 2022-08-17 DIAGNOSIS — Z940 Kidney transplant status: Secondary | ICD-10-CM | POA: Diagnosis not present

## 2022-08-17 DIAGNOSIS — F05 Delirium due to known physiological condition: Secondary | ICD-10-CM | POA: Diagnosis not present

## 2022-08-18 DIAGNOSIS — N185 Chronic kidney disease, stage 5: Principal | ICD-10-CM

## 2022-08-18 DIAGNOSIS — D631 Anemia in chronic kidney disease: Principal | ICD-10-CM

## 2022-08-18 DIAGNOSIS — Z940 Kidney transplant status: Principal | ICD-10-CM

## 2022-08-18 MED ORDER — FLUCONAZOLE 100 MG TABLET
ORAL_TABLET | Freq: Every day | ORAL | 0 refills | 8 days | Status: CP
Start: 2022-08-18 — End: 2022-08-26
  Filled 2022-08-20: qty 8, 8d supply, fill #0

## 2022-08-18 MED ORDER — INSULIN ASPART (U-100) 100 UNIT/ML (3 ML) SUBCUTANEOUS PEN
12 refills | 0 days | Status: CP
Start: 2022-08-18 — End: ?
  Filled 2022-08-20: qty 15, 83d supply, fill #0

## 2022-08-18 MED ORDER — SENNOSIDES 8.6 MG TABLET
ORAL_TABLET | Freq: Every day | ORAL | 11 refills | 30 days | Status: CP | PRN
Start: 2022-08-18 — End: ?
  Filled 2022-08-20: qty 30, 30d supply, fill #0

## 2022-08-18 MED ORDER — ASPIRIN 81 MG TABLET,DELAYED RELEASE
ORAL_TABLET | Freq: Every day | ORAL | 11 refills | 30 days | Status: CP
Start: 2022-08-18 — End: ?
  Filled 2022-08-20: qty 60, 10d supply, fill #0

## 2022-08-18 MED ORDER — DOCUSATE SODIUM 100 MG CAPSULE
ORAL_CAPSULE | Freq: Two times a day (BID) | ORAL | 11 refills | 30 days | Status: CP | PRN
Start: 2022-08-18 — End: ?
  Filled 2022-08-20: qty 60, 30d supply, fill #0

## 2022-08-18 MED ORDER — PANTOPRAZOLE 40 MG TABLET,DELAYED RELEASE
ORAL_TABLET | Freq: Every day | ORAL | 11 refills | 30 days | Status: CP
Start: 2022-08-18 — End: ?
  Filled 2022-08-20: qty 30, 30d supply, fill #0

## 2022-08-18 MED ORDER — LANCETS
0 refills | 0 days | Status: CP
Start: 2022-08-18 — End: ?
  Filled 2022-08-18: qty 100, 25d supply, fill #0

## 2022-08-18 MED ORDER — SULFAMETHOXAZOLE 400 MG-TRIMETHOPRIM 80 MG TABLET
ORAL_TABLET | ORAL | 5 refills | 28 days | Status: CP
Start: 2022-08-18 — End: ?
  Filled 2022-08-20: qty 12, 28d supply, fill #0

## 2022-08-18 MED ORDER — CALCITRIOL 0.5 MCG CAPSULE
ORAL_CAPSULE | Freq: Every day | ORAL | 11 refills | 30 days | Status: CP
Start: 2022-08-18 — End: 2023-08-18

## 2022-08-18 MED ORDER — PREDNISONE 5 MG TABLET
ORAL_TABLET | Freq: Every day | ORAL | 11 refills | 30 days | Status: CP
Start: 2022-08-18 — End: ?
  Filled 2022-08-20: qty 30, 30d supply, fill #0

## 2022-08-18 MED ORDER — POLYETHYLENE GLYCOL 3350 17 GRAM/DOSE ORAL POWDER
Freq: Every day | ORAL | 11 refills | 30 days | Status: CP | PRN
Start: 2022-08-18 — End: 2022-09-17
  Filled 2022-08-20: qty 238, 14d supply, fill #0

## 2022-08-18 MED ORDER — CALCIUM CARBONATE 200 MG CALCIUM (500 MG) CHEWABLE TABLET
ORAL_TABLET | Freq: Three times a day (TID) | ORAL | 11 refills | 30 days | Status: CP
Start: 2022-08-18 — End: ?
  Filled 2022-08-20: qty 150, 25d supply, fill #0

## 2022-08-18 MED ORDER — ATORVASTATIN 10 MG TABLET
ORAL_TABLET | Freq: Every day | ORAL | 11 refills | 30 days | Status: CP
Start: 2022-08-18 — End: ?
  Filled 2022-08-20: qty 30, 30d supply, fill #0

## 2022-08-18 MED ORDER — MYCOPHENOLATE MOFETIL 250 MG CAPSULE
ORAL_CAPSULE | Freq: Three times a day (TID) | ORAL | 11 refills | 30 days | Status: CP
Start: 2022-08-18 — End: 2023-08-18
  Filled 2022-08-20: qty 180, 30d supply, fill #0

## 2022-08-18 MED ORDER — BLOOD GLUCOSE TEST STRIPS
ORAL_STRIP | 11 refills | 0 days | Status: CP
Start: 2022-08-18 — End: ?
  Filled 2022-08-18: qty 100, 25d supply, fill #0

## 2022-08-18 MED ORDER — BLOOD-GLUCOSE METER KIT WRAPPER
0 refills | 0 days | Status: CP
Start: 2022-08-18 — End: 2023-08-18
  Filled 2022-08-18: qty 1, 1d supply, fill #0

## 2022-08-18 MED ORDER — ACETAMINOPHEN 500 MG TABLET
ORAL_TABLET | Freq: Three times a day (TID) | ORAL | 11 refills | 10 days | Status: CP | PRN
Start: 2022-08-18 — End: ?

## 2022-08-18 MED ORDER — TACROLIMUS 1 MG CAPSULE, IMMEDIATE-RELEASE
ORAL_CAPSULE | Freq: Two times a day (BID) | ORAL | 11 refills | 30 days | Status: CP
Start: 2022-08-18 — End: 2023-08-18
  Filled 2022-08-20: qty 240, 30d supply, fill #0

## 2022-08-18 MED ORDER — HYDRALAZINE 100 MG TABLET
ORAL_TABLET | Freq: Three times a day (TID) | ORAL | 11 refills | 30 days | Status: CP
Start: 2022-08-18 — End: ?

## 2022-08-18 MED ORDER — MG-PLUS-PROTEIN 133 MG TABLET
ORAL_TABLET | Freq: Two times a day (BID) | ORAL | 11 refills | 30 days | Status: CP
Start: 2022-08-18 — End: ?
  Filled 2022-08-20: qty 100, 50d supply, fill #0

## 2022-08-18 MED ORDER — CARVEDILOL 25 MG TABLET
ORAL_TABLET | Freq: Two times a day (BID) | ORAL | 11 refills | 30 days | Status: CP
Start: 2022-08-18 — End: 2023-08-18
  Filled 2022-08-20: qty 60, 30d supply, fill #0

## 2022-08-18 MED ORDER — LEVOTHYROXINE 112 MCG TABLET
ORAL_TABLET | Freq: Every day | ORAL | 11 refills | 30 days | Status: CP
Start: 2022-08-18 — End: ?

## 2022-08-18 MED ORDER — CLONIDINE HCL 0.1 MG TABLET
ORAL_TABLET | Freq: Three times a day (TID) | ORAL | 11 refills | 30 days | Status: CP
Start: 2022-08-18 — End: ?
  Filled 2022-08-20: qty 90, 30d supply, fill #0

## 2022-08-18 MED ORDER — PEN NEEDLE, DIABETIC 32 GAUGE X 5/32" (4 MM)
11 refills | 0 days | Status: CP
Start: 2022-08-18 — End: ?
  Filled 2022-08-20: qty 100, 25d supply, fill #0

## 2022-08-18 MED ORDER — AMLODIPINE 10 MG TABLET
ORAL_TABLET | Freq: Every day | ORAL | 11 refills | 30 days | Status: CP
Start: 2022-08-18 — End: ?

## 2022-08-20 DIAGNOSIS — Z79621 Long term (current) use of calcineurin inhibitor: Secondary | ICD-10-CM | POA: Diagnosis not present

## 2022-08-20 DIAGNOSIS — D84821 Immunodeficiency due to drugs: Secondary | ICD-10-CM | POA: Diagnosis not present

## 2022-08-20 DIAGNOSIS — E871 Hypo-osmolality and hyponatremia: Secondary | ICD-10-CM | POA: Diagnosis not present

## 2022-08-20 DIAGNOSIS — D649 Anemia, unspecified: Secondary | ICD-10-CM | POA: Diagnosis not present

## 2022-08-20 DIAGNOSIS — Z940 Kidney transplant status: Secondary | ICD-10-CM | POA: Diagnosis not present

## 2022-08-20 MED FILL — ASPIRIN 81 MG TABLET,DELAYED RELEASE: ORAL | 30 days supply | Qty: 30 | Fill #0

## 2022-08-21 ENCOUNTER — Ambulatory Visit: Admit: 2022-08-21 | Payer: MEDICARE

## 2022-08-21 ENCOUNTER — Ambulatory Visit: Admit: 2022-08-21 | Discharge: 2022-08-28 | Disposition: A | Discharge status: Home Health | Payer: MEDICARE

## 2022-08-21 ENCOUNTER — Ambulatory Visit: Admit: 2022-08-21 | Discharge: 2022-08-28 | Payer: MEDICARE

## 2022-08-21 DIAGNOSIS — Z992 Dependence on renal dialysis: Secondary | ICD-10-CM | POA: Diagnosis not present

## 2022-08-21 DIAGNOSIS — E871 Hypo-osmolality and hyponatremia: Secondary | ICD-10-CM | POA: Diagnosis not present

## 2022-08-21 DIAGNOSIS — T8619 Other complication of kidney transplant: Secondary | ICD-10-CM | POA: Diagnosis not present

## 2022-08-21 DIAGNOSIS — R7989 Other specified abnormal findings of blood chemistry: Secondary | ICD-10-CM | POA: Diagnosis not present

## 2022-08-21 DIAGNOSIS — I2510 Atherosclerotic heart disease of native coronary artery without angina pectoris: Secondary | ICD-10-CM | POA: Diagnosis not present

## 2022-08-21 DIAGNOSIS — N3001 Acute cystitis with hematuria: Secondary | ICD-10-CM | POA: Diagnosis not present

## 2022-08-21 DIAGNOSIS — E785 Hyperlipidemia, unspecified: Secondary | ICD-10-CM | POA: Diagnosis not present

## 2022-08-21 DIAGNOSIS — R509 Fever, unspecified: Secondary | ICD-10-CM | POA: Diagnosis not present

## 2022-08-21 DIAGNOSIS — T8613 Kidney transplant infection: Secondary | ICD-10-CM | POA: Diagnosis not present

## 2022-08-21 DIAGNOSIS — E1122 Type 2 diabetes mellitus with diabetic chronic kidney disease: Secondary | ICD-10-CM | POA: Diagnosis not present

## 2022-08-21 DIAGNOSIS — E1165 Type 2 diabetes mellitus with hyperglycemia: Secondary | ICD-10-CM | POA: Diagnosis not present

## 2022-08-21 DIAGNOSIS — E11649 Type 2 diabetes mellitus with hypoglycemia without coma: Secondary | ICD-10-CM | POA: Diagnosis not present

## 2022-08-21 DIAGNOSIS — Z20822 Contact with and (suspected) exposure to covid-19: Secondary | ICD-10-CM | POA: Diagnosis not present

## 2022-08-21 DIAGNOSIS — B9689 Other specified bacterial agents as the cause of diseases classified elsewhere: Secondary | ICD-10-CM | POA: Diagnosis not present

## 2022-08-21 DIAGNOSIS — E039 Hypothyroidism, unspecified: Secondary | ICD-10-CM | POA: Diagnosis not present

## 2022-08-21 DIAGNOSIS — Z79621 Long term (current) use of calcineurin inhibitor: Secondary | ICD-10-CM | POA: Diagnosis not present

## 2022-08-21 DIAGNOSIS — J811 Chronic pulmonary edema: Secondary | ICD-10-CM | POA: Diagnosis not present

## 2022-08-21 DIAGNOSIS — N390 Urinary tract infection, site not specified: Secondary | ICD-10-CM | POA: Diagnosis not present

## 2022-08-21 DIAGNOSIS — Z940 Kidney transplant status: Secondary | ICD-10-CM | POA: Diagnosis not present

## 2022-08-21 DIAGNOSIS — R600 Localized edema: Secondary | ICD-10-CM | POA: Diagnosis not present

## 2022-08-21 DIAGNOSIS — R7881 Bacteremia: Secondary | ICD-10-CM | POA: Diagnosis not present

## 2022-08-21 DIAGNOSIS — T380X5A Adverse effect of glucocorticoids and synthetic analogues, initial encounter: Secondary | ICD-10-CM | POA: Diagnosis not present

## 2022-08-21 DIAGNOSIS — F05 Delirium due to known physiological condition: Secondary | ICD-10-CM | POA: Diagnosis not present

## 2022-08-21 DIAGNOSIS — I120 Hypertensive chronic kidney disease with stage 5 chronic kidney disease or end stage renal disease: Secondary | ICD-10-CM | POA: Diagnosis not present

## 2022-08-21 DIAGNOSIS — R1110 Vomiting, unspecified: Secondary | ICD-10-CM | POA: Diagnosis not present

## 2022-08-21 DIAGNOSIS — B9620 Unspecified Escherichia coli [E. coli] as the cause of diseases classified elsewhere: Secondary | ICD-10-CM | POA: Diagnosis not present

## 2022-08-21 DIAGNOSIS — D84821 Immunodeficiency due to drugs: Secondary | ICD-10-CM | POA: Diagnosis not present

## 2022-08-21 DIAGNOSIS — N186 End stage renal disease: Secondary | ICD-10-CM | POA: Diagnosis not present

## 2022-08-21 DIAGNOSIS — I10 Essential (primary) hypertension: Secondary | ICD-10-CM | POA: Diagnosis not present

## 2022-08-21 DIAGNOSIS — Z794 Long term (current) use of insulin: Secondary | ICD-10-CM | POA: Diagnosis not present

## 2022-08-21 MED ORDER — TORSEMIDE 20 MG TABLET
ORAL_TABLET | Freq: Every day | ORAL | 0 refills | 30 days | Status: CP
Start: 2022-08-21 — End: 2022-09-20
  Filled 2022-08-20: qty 120, 30d supply, fill #0

## 2022-08-21 MED ORDER — VALGANCICLOVIR 450 MG TABLET
ORAL_TABLET | ORAL | 2 refills | 28 days | Status: CP
Start: 2022-08-21 — End: 2022-11-19
  Filled 2022-08-20: qty 8, 28d supply, fill #0

## 2022-08-22 DIAGNOSIS — Z794 Long term (current) use of insulin: Secondary | ICD-10-CM | POA: Diagnosis not present

## 2022-08-22 DIAGNOSIS — R7881 Bacteremia: Secondary | ICD-10-CM | POA: Diagnosis not present

## 2022-08-22 DIAGNOSIS — Z79621 Long term (current) use of calcineurin inhibitor: Secondary | ICD-10-CM | POA: Diagnosis not present

## 2022-08-22 DIAGNOSIS — R509 Fever, unspecified: Secondary | ICD-10-CM | POA: Diagnosis not present

## 2022-08-22 DIAGNOSIS — T380X5A Adverse effect of glucocorticoids and synthetic analogues, initial encounter: Secondary | ICD-10-CM | POA: Diagnosis not present

## 2022-08-22 DIAGNOSIS — T8619 Other complication of kidney transplant: Secondary | ICD-10-CM | POA: Diagnosis not present

## 2022-08-22 DIAGNOSIS — Z940 Kidney transplant status: Secondary | ICD-10-CM | POA: Diagnosis not present

## 2022-08-22 DIAGNOSIS — E1165 Type 2 diabetes mellitus with hyperglycemia: Secondary | ICD-10-CM | POA: Diagnosis not present

## 2022-08-22 DIAGNOSIS — D84821 Immunodeficiency due to drugs: Secondary | ICD-10-CM | POA: Diagnosis not present

## 2022-08-22 DIAGNOSIS — R7989 Other specified abnormal findings of blood chemistry: Secondary | ICD-10-CM | POA: Diagnosis not present

## 2022-08-22 DIAGNOSIS — B9689 Other specified bacterial agents as the cause of diseases classified elsewhere: Secondary | ICD-10-CM | POA: Diagnosis not present

## 2022-08-23 DIAGNOSIS — T380X5A Adverse effect of glucocorticoids and synthetic analogues, initial encounter: Secondary | ICD-10-CM | POA: Diagnosis not present

## 2022-08-23 DIAGNOSIS — Z794 Long term (current) use of insulin: Secondary | ICD-10-CM | POA: Diagnosis not present

## 2022-08-23 DIAGNOSIS — N186 End stage renal disease: Secondary | ICD-10-CM | POA: Diagnosis not present

## 2022-08-23 DIAGNOSIS — B9689 Other specified bacterial agents as the cause of diseases classified elsewhere: Secondary | ICD-10-CM | POA: Diagnosis not present

## 2022-08-23 DIAGNOSIS — Z940 Kidney transplant status: Secondary | ICD-10-CM | POA: Diagnosis not present

## 2022-08-23 DIAGNOSIS — R7881 Bacteremia: Secondary | ICD-10-CM | POA: Diagnosis not present

## 2022-08-23 DIAGNOSIS — D84821 Immunodeficiency due to drugs: Secondary | ICD-10-CM | POA: Diagnosis not present

## 2022-08-23 DIAGNOSIS — Z79621 Long term (current) use of calcineurin inhibitor: Secondary | ICD-10-CM | POA: Diagnosis not present

## 2022-08-23 DIAGNOSIS — I10 Essential (primary) hypertension: Secondary | ICD-10-CM | POA: Diagnosis not present

## 2022-08-23 DIAGNOSIS — T8619 Other complication of kidney transplant: Secondary | ICD-10-CM | POA: Diagnosis not present

## 2022-08-23 DIAGNOSIS — E1165 Type 2 diabetes mellitus with hyperglycemia: Secondary | ICD-10-CM | POA: Diagnosis not present

## 2022-08-23 DIAGNOSIS — Z992 Dependence on renal dialysis: Secondary | ICD-10-CM | POA: Diagnosis not present

## 2022-08-24 DIAGNOSIS — E1165 Type 2 diabetes mellitus with hyperglycemia: Secondary | ICD-10-CM | POA: Diagnosis not present

## 2022-08-24 DIAGNOSIS — Z79621 Long term (current) use of calcineurin inhibitor: Secondary | ICD-10-CM | POA: Diagnosis not present

## 2022-08-24 DIAGNOSIS — I10 Essential (primary) hypertension: Secondary | ICD-10-CM | POA: Diagnosis not present

## 2022-08-24 DIAGNOSIS — J811 Chronic pulmonary edema: Secondary | ICD-10-CM | POA: Diagnosis not present

## 2022-08-24 DIAGNOSIS — B9689 Other specified bacterial agents as the cause of diseases classified elsewhere: Secondary | ICD-10-CM | POA: Diagnosis not present

## 2022-08-24 DIAGNOSIS — Z940 Kidney transplant status: Secondary | ICD-10-CM | POA: Diagnosis not present

## 2022-08-24 DIAGNOSIS — T380X5A Adverse effect of glucocorticoids and synthetic analogues, initial encounter: Secondary | ICD-10-CM | POA: Diagnosis not present

## 2022-08-24 DIAGNOSIS — N390 Urinary tract infection, site not specified: Secondary | ICD-10-CM | POA: Diagnosis not present

## 2022-08-24 DIAGNOSIS — D84821 Immunodeficiency due to drugs: Secondary | ICD-10-CM | POA: Diagnosis not present

## 2022-08-24 DIAGNOSIS — T8619 Other complication of kidney transplant: Secondary | ICD-10-CM | POA: Diagnosis not present

## 2022-08-24 DIAGNOSIS — R7881 Bacteremia: Secondary | ICD-10-CM | POA: Diagnosis not present

## 2022-08-24 DIAGNOSIS — Z794 Long term (current) use of insulin: Secondary | ICD-10-CM | POA: Diagnosis not present

## 2022-08-24 DIAGNOSIS — R7989 Other specified abnormal findings of blood chemistry: Secondary | ICD-10-CM | POA: Diagnosis not present

## 2022-08-25 DIAGNOSIS — N186 End stage renal disease: Secondary | ICD-10-CM | POA: Diagnosis not present

## 2022-08-25 DIAGNOSIS — R7881 Bacteremia: Secondary | ICD-10-CM | POA: Diagnosis not present

## 2022-08-25 DIAGNOSIS — I10 Essential (primary) hypertension: Secondary | ICD-10-CM | POA: Diagnosis not present

## 2022-08-25 DIAGNOSIS — D84821 Immunodeficiency due to drugs: Secondary | ICD-10-CM | POA: Diagnosis not present

## 2022-08-25 DIAGNOSIS — E039 Hypothyroidism, unspecified: Secondary | ICD-10-CM | POA: Diagnosis not present

## 2022-08-25 DIAGNOSIS — E785 Hyperlipidemia, unspecified: Secondary | ICD-10-CM | POA: Diagnosis not present

## 2022-08-25 DIAGNOSIS — I120 Hypertensive chronic kidney disease with stage 5 chronic kidney disease or end stage renal disease: Secondary | ICD-10-CM | POA: Diagnosis not present

## 2022-08-25 DIAGNOSIS — E1165 Type 2 diabetes mellitus with hyperglycemia: Secondary | ICD-10-CM | POA: Diagnosis not present

## 2022-08-25 DIAGNOSIS — Z940 Kidney transplant status: Secondary | ICD-10-CM | POA: Diagnosis not present

## 2022-08-25 DIAGNOSIS — Z794 Long term (current) use of insulin: Secondary | ICD-10-CM | POA: Diagnosis not present

## 2022-08-25 DIAGNOSIS — J811 Chronic pulmonary edema: Secondary | ICD-10-CM | POA: Diagnosis not present

## 2022-08-25 DIAGNOSIS — E1122 Type 2 diabetes mellitus with diabetic chronic kidney disease: Secondary | ICD-10-CM | POA: Diagnosis not present

## 2022-08-25 DIAGNOSIS — Z992 Dependence on renal dialysis: Secondary | ICD-10-CM | POA: Diagnosis not present

## 2022-08-25 DIAGNOSIS — T380X5A Adverse effect of glucocorticoids and synthetic analogues, initial encounter: Secondary | ICD-10-CM | POA: Diagnosis not present

## 2022-08-25 DIAGNOSIS — N390 Urinary tract infection, site not specified: Secondary | ICD-10-CM | POA: Diagnosis not present

## 2022-08-25 DIAGNOSIS — F05 Delirium due to known physiological condition: Secondary | ICD-10-CM | POA: Diagnosis not present

## 2022-08-25 DIAGNOSIS — I2510 Atherosclerotic heart disease of native coronary artery without angina pectoris: Secondary | ICD-10-CM | POA: Diagnosis not present

## 2022-08-25 DIAGNOSIS — Z79621 Long term (current) use of calcineurin inhibitor: Secondary | ICD-10-CM | POA: Diagnosis not present

## 2022-08-25 DIAGNOSIS — Z79899 Other long term (current) drug therapy: Principal | ICD-10-CM

## 2022-08-26 DIAGNOSIS — T380X5A Adverse effect of glucocorticoids and synthetic analogues, initial encounter: Secondary | ICD-10-CM | POA: Diagnosis not present

## 2022-08-26 DIAGNOSIS — E1165 Type 2 diabetes mellitus with hyperglycemia: Secondary | ICD-10-CM | POA: Diagnosis not present

## 2022-08-26 DIAGNOSIS — Z940 Kidney transplant status: Secondary | ICD-10-CM | POA: Diagnosis not present

## 2022-08-26 DIAGNOSIS — E11649 Type 2 diabetes mellitus with hypoglycemia without coma: Secondary | ICD-10-CM | POA: Diagnosis not present

## 2022-08-26 DIAGNOSIS — I10 Essential (primary) hypertension: Secondary | ICD-10-CM | POA: Diagnosis not present

## 2022-08-26 DIAGNOSIS — N390 Urinary tract infection, site not specified: Secondary | ICD-10-CM | POA: Diagnosis not present

## 2022-08-26 DIAGNOSIS — R7881 Bacteremia: Secondary | ICD-10-CM | POA: Diagnosis not present

## 2022-08-27 DIAGNOSIS — E039 Hypothyroidism, unspecified: Secondary | ICD-10-CM | POA: Diagnosis not present

## 2022-08-27 DIAGNOSIS — I10 Essential (primary) hypertension: Secondary | ICD-10-CM | POA: Diagnosis not present

## 2022-08-27 DIAGNOSIS — R600 Localized edema: Secondary | ICD-10-CM | POA: Diagnosis not present

## 2022-08-27 DIAGNOSIS — N390 Urinary tract infection, site not specified: Secondary | ICD-10-CM | POA: Diagnosis not present

## 2022-08-27 DIAGNOSIS — R7881 Bacteremia: Secondary | ICD-10-CM | POA: Diagnosis not present

## 2022-08-28 DIAGNOSIS — Z794 Long term (current) use of insulin: Secondary | ICD-10-CM | POA: Diagnosis not present

## 2022-08-28 DIAGNOSIS — Z940 Kidney transplant status: Secondary | ICD-10-CM | POA: Diagnosis not present

## 2022-08-28 DIAGNOSIS — R7881 Bacteremia: Secondary | ICD-10-CM | POA: Diagnosis not present

## 2022-08-28 DIAGNOSIS — N390 Urinary tract infection, site not specified: Secondary | ICD-10-CM | POA: Diagnosis not present

## 2022-08-28 DIAGNOSIS — B9620 Unspecified Escherichia coli [E. coli] as the cause of diseases classified elsewhere: Secondary | ICD-10-CM | POA: Diagnosis not present

## 2022-08-28 DIAGNOSIS — T380X5A Adverse effect of glucocorticoids and synthetic analogues, initial encounter: Secondary | ICD-10-CM | POA: Diagnosis not present

## 2022-08-28 DIAGNOSIS — E1165 Type 2 diabetes mellitus with hyperglycemia: Secondary | ICD-10-CM | POA: Diagnosis not present

## 2022-08-28 MED ORDER — MYCOPHENOLATE MOFETIL 250 MG CAPSULE
ORAL_CAPSULE | Freq: Two times a day (BID) | ORAL | 11 refills | 30 days | Status: CP
Start: 2022-08-28 — End: 2023-08-28

## 2022-08-28 MED ORDER — DEXCOM G7 SENSOR DEVICE
11 refills | 0 days | Status: CP
Start: 2022-08-28 — End: ?

## 2022-08-28 MED ORDER — INSULIN ASPART (U-100) 100 UNIT/ML (3 ML) SUBCUTANEOUS PEN
SUBCUTANEOUS | 12 refills | 36 days | Status: CP
Start: 2022-08-28 — End: ?

## 2022-08-28 MED ORDER — TACROLIMUS 1 MG CAPSULE, IMMEDIATE-RELEASE
ORAL_CAPSULE | Freq: Two times a day (BID) | ORAL | 11 refills | 30 days | Status: CP
Start: 2022-08-28 — End: 2023-08-28

## 2022-08-29 DIAGNOSIS — Z940 Kidney transplant status: Principal | ICD-10-CM

## 2022-08-29 MED ORDER — CHLORTHALIDONE 25 MG TABLET
ORAL_TABLET | Freq: Every day | ORAL | 11 refills | 30 days | Status: CP
Start: 2022-08-29 — End: 2023-08-29
  Filled 2022-08-28: qty 30, 30d supply, fill #0

## 2022-08-29 MED ORDER — LEVOFLOXACIN 500 MG TABLET
ORAL_TABLET | ORAL | 0 refills | 20 days | Status: CP
Start: 2022-08-29 — End: 2022-09-17
  Filled 2022-08-28: qty 10, 20d supply, fill #0

## 2022-08-30 ENCOUNTER — Institutional Professional Consult (permissible substitution): Admit: 2022-08-30 | Discharge: 2022-08-31 | Payer: MEDICARE

## 2022-08-30 ENCOUNTER — Ambulatory Visit: Admit: 2022-08-30 | Discharge: 2022-08-31 | Payer: MEDICARE | Attending: Family | Primary: Family

## 2022-08-30 DIAGNOSIS — D631 Anemia in chronic kidney disease: Secondary | ICD-10-CM | POA: Diagnosis not present

## 2022-08-30 DIAGNOSIS — Z992 Dependence on renal dialysis: Secondary | ICD-10-CM | POA: Diagnosis not present

## 2022-08-30 DIAGNOSIS — N185 Chronic kidney disease, stage 5: Secondary | ICD-10-CM | POA: Diagnosis not present

## 2022-08-30 DIAGNOSIS — E871 Hypo-osmolality and hyponatremia: Secondary | ICD-10-CM | POA: Diagnosis not present

## 2022-08-30 DIAGNOSIS — Z940 Kidney transplant status: Principal | ICD-10-CM

## 2022-08-30 DIAGNOSIS — Z4803 Encounter for change or removal of drains: Principal | ICD-10-CM

## 2022-08-30 DIAGNOSIS — Z09 Encounter for follow-up examination after completed treatment for conditions other than malignant neoplasm: Principal | ICD-10-CM

## 2022-08-30 MED ORDER — NYSTATIN 100,000 UNIT/ML ORAL SUSPENSION
Freq: Four times a day (QID) | ORAL | 0 refills | 14 days | Status: CP
Start: 2022-08-30 — End: 2022-09-13
  Filled 2022-08-30: qty 280, 14d supply, fill #0

## 2022-08-30 MED ORDER — INSULIN GLARGINE (U-100) 100 UNIT/ML (3 ML) SUBCUTANEOUS PEN
Freq: Every evening | SUBCUTANEOUS | 3 refills | 150.00000 days | Status: CP
Start: 2022-08-30 — End: 2023-08-30
  Filled 2022-08-30: qty 15, 140d supply, fill #0

## 2022-08-30 MED ORDER — INSULIN ASPART (U-100) 100 UNIT/ML (3 ML) SUBCUTANEOUS PEN
SUBCUTANEOUS | 11 refills | 31 days | Status: CP
Start: 2022-08-30 — End: 2023-08-30

## 2022-08-30 MED ORDER — VALGANCICLOVIR 450 MG TABLET
ORAL_TABLET | ORAL | 0 refills | 90 days | Status: CP
Start: 2022-08-30 — End: 2022-11-28

## 2022-08-30 NOTE — Unmapped (Signed)
[facility] CLINIC PHARMACY NOTE  Julie [name]  [id number]    Medication changes today:   1. Start Lantus 5 units daily (daughter will give at midday)  2. Decrease Novolog to 3 units TID AC + SSI  3. Aranesp 100 mcg given in clinic  4. Increase Valcyte to 450 mg every other day  5. Start Nystatin 5 mL QID    Education/Adherence tools provided today:  - Provided updated medication list  - Provided additional education on immunosuppression and transplant related medications including reviewing indications of medications, dosing and side effects  - Discussed adherence reminder tools such as cell phone alarms    Follow up items:  1. Goal of understanding indications and dosing of immunosuppression medications  2. BP  3. BG, insulin adjustments  4. Valcyte renal dose adjustment  5. Trush  6. Levofloxacin dose    Next visit with pharmacy in 1-2 weeks  ____________________________________________________________________    Julie [name] is a 72 y.o. 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     Immunologic Risk: cPRA 0,, HLA MM 5/6,, and first transplant    Induction Agent : basiliximab    Donor Factors: KDPI 56 and DCD    Other PMH significant for Diabetes and Hypothyroidism    Post op course complicated by infection    Rejection History: NTD  Infection History: E Coli bacteremia 07/2022  ___________________________________________________________________    First visit with transplant pharmacy    Interval History:   Discharge 08/20/22 from index admission  Readmitted 08/21/22 for E Coli bacteremia    Seen by pharmacy today for: medication management, blood glucose management and education, and pill box fill and adherence education    CC:  Patient has no complaints today     General: No issues  Neuro:  None per patient. Daughters helping with medications and cares.  CV: No issues  Resp: No issues  GI: No issues  GU: No issues  Derm: No issues  Psych: No issues.    Fluid Status:   Edema no, SOB no      Vitals:    08/30/22 1152   BP: 167/60   Pulse: 63   Temp: 36.2 ??C (97.2 ??F)     ___________________________________________________________________    Allergies   Allergen Reactions    Penicillins Other (See Comments)     patient denies PCN allergy and states she had formal allergy testing that ruled out PCN allergy. 09/27/18    Prior allergy comment states hypotension and rash with PCN, patient does not recall this    Oxycodone Nausea And Vomiting     Per Pt cannot tolerate oxycodone    Valacyclovir Nausea Only       Medications reviewed in EPIC medication station and updated today by the clinical pharmacist practitioner.    Current Outpatient Medications   Medication Instructions    acetaminophen (TYLENOL) 500-1,000 mg, Oral, Every 8 hours PRN    amlodipine (NORVASC) 10 mg, Oral, Daily (standard)    aspirin (ECOTRIN) 81 mg, Oral, Daily (standard)    atorvastatin (LIPITOR) 10 mg, Oral, Daily (standard)    blood sugar diagnostic (ACCU-CHEK AVIVA PLUS TEST STRP) Strp USE 1 STRIP TO CHECK GLUCOSE THREE TIMES DAILY    blood sugar diagnostic (GLUCOSE BLOOD) Strp Use to check blood sugar as directed with insulin 3 times a day & for symptoms of high or low blood sugar.    blood sugar diagnostic Strp USE  1 STRIP TO CHECK GLUCOSE THREE TIMES DAILY    blood-glucose meter kit Use as directed. ACCU-CHEK AVIVA    blood-glucose meter kit Use as instructed    blood-glucose sensor (DEXCOM G7 SENSOR) Devi Use 1 sensor every 10 days.    calcitriol (ROCALTROL) 0.5 mcg, Oral, Daily (standard)    calcium carbonate (TUMS) 200 mg calcium (500 mg) chewable tablet 400 mg of elem calcium, Oral, 3 times a day (standard)    carvedilol (COREG) 25 mg, Oral, 2 times a day (standard)    chlorthalidone (HYGROTON) 25 mg, Oral, Daily (standard)    cloNIDine HCL (CATAPRES) 0.1 mg, Oral, 3 times a day (standard)    docusate sodium (COLACE) 100 mg, Oral, 2 times a day PRN    hydrALAZINE (APRESOLINE) 100 mg, Oral, Every 8 hours    insulin aspart (NOVOLOG FLEXPEN U-100 INSULIN) 100 unit/mL (3 mL) injection pen Inject 6 Units under the skin Three (3) times a day before meals AND 0 to 8 Units additional insulin based on blood sugar level Three (3) times a day before meals. If BG 151-200 = 2 additional units; If BG 201-250 = 4 additional units. If BG 251-300 = 6 additional units; If BG 301-350 = 8 units and notify coordinator if BG continues to be elevated..    lancets Misc Use to check blood sugar as directed with insulin 3 times a day & for symptoms of high or low blood sugar.    LANTUS SOLOSTAR U-100 INSULIN 5-10 Units, Subcutaneous, Nightly    levoFLOXacin (LEVAQUIN) 500 mg, Oral, Every other day    levothyroxine (SYNTHROID) 112 mcg, Oral, Daily (standard)    magnesium oxide-Mg AA chelate (MAGNESIUM, AMINO ACID CHELATE,) 133 mg 1 tablet, Oral, 2 times a day, Hold until directed to take by nurse coordinator    mycophenolate (CELLCEPT) 500 mg, Oral, 2 times a day (standard)    nystatin (MYCOSTATIN) 500,000 Units, Oral, 4 times a day    pantoprazole (PROTONIX) 40 mg, Oral, Daily (standard)    pen needle, diabetic 32 gauge x 5/32 (4 mm) Ndle Use with insulin up to 4 times/day as needed.    pen needle, diabetic 32 gauge x 5/32 Ndle Use with insulin 2 times daily    polyethylene glycol (GLYCOLAX) 17 gram/dose powder Take 17 g (1 capful in 4-8 oz of liquid)  by mouth daily as needed.    predniSONE (DELTASONE) 5 mg, Oral, Daily    SENNA 8.6 mg tablet 1 tablet, Oral, Daily PRN    sulfamethoxazole-trimethoprim (BACTRIM) 400-80 mg per tablet 80 mg of trimethoprim, Oral, Every Mon-Wed-Fri    tacrolimus (PROGRAF) 3 mg, Oral, 2 times a day    valGANciclovir (VALCYTE) 450 mg, Oral, Every other day       GRAFT FUNCTION: improving  Lab Results   Component Value Date    Creatinine 2.04 (H) 08/28/2022    Creatinine 2.36 (H) 08/27/2022    Creatinine 2.72 (H) 08/26/2022    Creatinine 2.94 (H) 08/25/2022     Baseline Scr: TBD  Scr nadir: TBD  Estimated Creatinine Clearance: 20.4 mL/min (A) (based on SCr of 2.04 mg/dL (H)).    Proteinuria/UPC: Yes: improving.  Lab Results   Component Value Date    Protein/Creatinine Ratio, Urine 6.635 08/11/2022       DSA: ntd  No results found for: DSAPT, DSACM, DSA1C    Zero hour biopsy: n/a  Biopsies to date: NTD      CURRENT IMMUNOSUPPRESSION:    Tacrolimus (Prograf)  3 mg BID  Tacrolimus Goal: 8 - 10   Mycophenolate mofetil (Cellcept) 500 mg BID (lowered with infection)  Prednisone 5 mg daily    IMMUNOSUPPRESSION DRUG LEVELS:  Lab Results   Component Value Date    Tacrolimus, Trough 7.5 08/17/2022    Tacrolimus, Trough 6.4 08/16/2022    Tacrolimus, Trough 6.2 08/15/2022    Tacrolimus, Timed 4.6 08/28/2022    Tacrolimus, Timed 8.1 08/27/2022    Tacrolimus, Timed 10.4 08/26/2022     No level today.    Patient is tolerating immunosuppression well    WBC/ANC:  wnl  Lab Results   Component Value Date    WBC 3.1 (L) 08/28/2022     Plan: Will maintain current immunosuppression. Continue to monitor.      OI Prophylaxis:   CMV Status: D+/ R+, moderate risk .   CMV prophylaxis: valganciclovir 450 mg two days per week x 3 months per protocol.  No results found for: CMVCP    PCP Prophylaxis: bactrim SS 1 tab MWF x 6 months.    Thrush: completed in hospital. Patient as oral thrush.    Patient is  on renal dosing    Plan:  Increase Valcyte to 450 mg every other day. Start Nystatin 5 mL QID  . Continue to monitor.    E Coli bacteremia  Levofloxacin, EOT 2/26    CV Prophylaxis: asa 81 mg   The 10-year ASCVD risk score (Arnett DK, et al., 2019) is: 44.7%  No results found for: LIPID    Statin therapy: Indicated; currently on atorvastatin 10 mg daily  Plan:  No change . Continue to monitor       BP: Goal < 140/90. Clinic vitals reported above  Home BP ranges: 160 - 180/40 - 50  Current meds include:   Amlodipine 10 mg daily  Carvedilol 25 mg BID  Chlorthalidone 25 mg daily  Clonidine 0.1 mg TID  Hydralazine 100 mg TID  Plan: out of goal, but trending down. DBP low. No change. Continue to monitor    Anemia of CKD:  H/H:   Lab Results   Component Value Date    HGB 7.7 (L) 08/28/2022     Lab Results   Component Value Date    HCT 22.7 (L) 08/28/2022     Iron panel:  Lab Results   Component Value Date    IRON 138 08/08/2022    TIBC 189 (L) 08/08/2022    FERRITIN 1,017.0 (H) 08/08/2022     Lab Results   Component Value Date    Iron Saturation (%) 73 (H) 08/08/2022     Prior ESA use: Epo given 1/25    Plan: out of goal. Aranesp 100 mcg given in clinic. Continue to monitor.     DM:   Lab Results   Component Value Date    A1C 5.6 08/06/2022   . Goal A1c < 7  History of Dm? Yes:    Established with endocrinologist/PCP for BG managment?     Currently on:   Novolog 6 units TID AC + SSI    Home BS log:   Breakfast Lunch  Dinner  HS   Date AC PC AC PC AC PC     190  212  220      160           Diet:Snacks  Exercise:minimal  Hypoglycemia: no  Plan: Start Lantus 5 units - midday when daughter can give. Decrease Novolog to 3 units TID  AC + SSI.      Electrolytes: wnl  Lab Results   Component Value Date    Potassium 4.6 08/28/2022    Potassium, Bld 3.8 08/21/2022    Sodium 137 08/28/2022    Sodium Whole Blood 132 (L) 08/21/2022    Magnesium 2.0 08/28/2022    CO2 22.0 08/28/2022    Phosphorus 2.4 08/28/2022     Meds currently on: None  Plan: No change.  Continue to monitor     GI/BM: pt reports 1 BM  Meds currently on: docusate PRN, Miralax PRN  Plan: No change.  Continue to monitor    Pain: pt reports mild pain  Meds currently on: APAP PRN  Plan: No change.  Continue to monitor    Bone health:   Vitamin D Level: none available. Goal > 30.   No results found for: VITDTOT, VITDTOTAL    Lab Results   Component Value Date    Calcium 9.5 08/28/2022    Calcium 9.4 08/27/2022     Last DEXA results:  none available  Current meds include: Tums 1000 mg TID. Calcitriol 0.5 mcg (unable to get from pharmacy)  Plan: Vitamin D level  needs to be drawn with next lab schedule,continue supplementation. Continue to monitor.     Women's/Men's Health:  Julie [name] is a 72 y.o. Female perimenopausal. Patient reports no men's/women's health issues  Plan: Continue to monitor    Immunizations:  Influenza [Annual]: Received 04/2022    PCV13: Received 2020  PPSV23: Received 2016. 2023  PCV20:     Shingrix Zoster [2 doses, 2 - 6 months apart]: Never received    COVID-19 [3 primary doses, 2 boosters]: Received      Pharmacy preference:  [facility]  Mail order - [facility]?  Medication Refills:    Medication Access:      Adherence: Patient has poor understanding of medications; was not able to independently identify names/doses of immunosuppressants and OI meds.  Patient  does not fill their own pill box on a regular basis at home. Daughter fills.  Patient brought medication card:yes  Pill box:was correct  Plan: Daughters will help with AM and will Facetime for PM meds; provided extensive adherence counseling/intervention    Patient was reviewed with  Phillips Grout, NP  who was agreement with the stated plan:     During this visit, the following was completed:   BG log data assessment  BP log data assessment  Labs ordered and evaluated  complex treatment plan >1 DS   I spent a total of 30 minutes face to face with the patient delivering clinical care and providing education/counseling.    All questions/concerns were addressed to the patient's satisfaction.  __________________________________________  Olivia Mackie, CPP, PharmD  Solid Organ Transplant Clinical Pharmacist Practitioner

## 2022-08-31 DIAGNOSIS — I2510 Atherosclerotic heart disease of native coronary artery without angina pectoris: Secondary | ICD-10-CM | POA: Diagnosis not present

## 2022-08-31 DIAGNOSIS — I700 Atherosclerosis of aorta: Secondary | ICD-10-CM | POA: Diagnosis not present

## 2022-08-31 DIAGNOSIS — N189 Chronic kidney disease, unspecified: Secondary | ICD-10-CM | POA: Diagnosis not present

## 2022-08-31 DIAGNOSIS — Z940 Kidney transplant status: Secondary | ICD-10-CM | POA: Diagnosis not present

## 2022-08-31 DIAGNOSIS — I1310 Hypertensive heart and chronic kidney disease without heart failure, with stage 1 through stage 4 chronic kidney disease, or unspecified chronic kidney disease: Secondary | ICD-10-CM | POA: Diagnosis not present

## 2022-08-31 DIAGNOSIS — E1122 Type 2 diabetes mellitus with diabetic chronic kidney disease: Secondary | ICD-10-CM | POA: Diagnosis not present

## 2022-08-31 DIAGNOSIS — Z4822 Encounter for aftercare following kidney transplant: Secondary | ICD-10-CM | POA: Diagnosis not present

## 2022-08-31 DIAGNOSIS — E039 Hypothyroidism, unspecified: Secondary | ICD-10-CM | POA: Diagnosis not present

## 2022-08-31 DIAGNOSIS — D631 Anemia in chronic kidney disease: Secondary | ICD-10-CM | POA: Diagnosis not present

## 2022-08-31 DIAGNOSIS — I351 Nonrheumatic aortic (valve) insufficiency: Secondary | ICD-10-CM | POA: Diagnosis not present

## 2022-09-01 DIAGNOSIS — D631 Anemia in chronic kidney disease: Secondary | ICD-10-CM | POA: Diagnosis not present

## 2022-09-01 DIAGNOSIS — N189 Chronic kidney disease, unspecified: Secondary | ICD-10-CM | POA: Diagnosis not present

## 2022-09-01 DIAGNOSIS — I1310 Hypertensive heart and chronic kidney disease without heart failure, with stage 1 through stage 4 chronic kidney disease, or unspecified chronic kidney disease: Secondary | ICD-10-CM | POA: Diagnosis not present

## 2022-09-01 DIAGNOSIS — E039 Hypothyroidism, unspecified: Secondary | ICD-10-CM | POA: Diagnosis not present

## 2022-09-01 DIAGNOSIS — I351 Nonrheumatic aortic (valve) insufficiency: Secondary | ICD-10-CM | POA: Diagnosis not present

## 2022-09-01 DIAGNOSIS — I700 Atherosclerosis of aorta: Secondary | ICD-10-CM | POA: Diagnosis not present

## 2022-09-01 DIAGNOSIS — E1122 Type 2 diabetes mellitus with diabetic chronic kidney disease: Secondary | ICD-10-CM | POA: Diagnosis not present

## 2022-09-01 DIAGNOSIS — Z940 Kidney transplant status: Secondary | ICD-10-CM | POA: Diagnosis not present

## 2022-09-01 DIAGNOSIS — Z4822 Encounter for aftercare following kidney transplant: Secondary | ICD-10-CM | POA: Diagnosis not present

## 2022-09-01 DIAGNOSIS — I2510 Atherosclerotic heart disease of native coronary artery without angina pectoris: Secondary | ICD-10-CM | POA: Diagnosis not present

## 2022-09-01 LAB — CBC W/ DIFFERENTIAL
BANDED NEUTROPHILS ABSOLUTE COUNT: 0.1 10*3/uL (ref 0.0–0.1)
BASOPHILS ABSOLUTE COUNT: 0 10*3/uL (ref 0.0–0.2)
BASOPHILS RELATIVE PERCENT: 1 %
EOSINOPHILS ABSOLUTE COUNT: 0 10*3/uL (ref 0.0–0.4)
EOSINOPHILS RELATIVE PERCENT: 0 %
HEMATOCRIT: 28.5 % — ABNORMAL LOW (ref 34.0–46.6)
HEMOGLOBIN: 9.3 g/dL — ABNORMAL LOW (ref 11.1–15.9)
IMMATURE GRANULOCYTES: 2 %
LYMPHOCYTES ABSOLUTE COUNT: 1 10*3/uL (ref 0.7–3.1)
LYMPHOCYTES RELATIVE PERCENT: 18 %
MEAN CORPUSCULAR HEMOGLOBIN CONC: 32.6 g/dL (ref 31.5–35.7)
MEAN CORPUSCULAR HEMOGLOBIN: 30.2 pg (ref 26.6–33.0)
MEAN CORPUSCULAR VOLUME: 93 fL (ref 79–97)
MONOCYTES ABSOLUTE COUNT: 0.5 10*3/uL (ref 0.1–0.9)
MONOCYTES RELATIVE PERCENT: 9 %
NEUTROPHILS ABSOLUTE COUNT: 3.8 10*3/uL (ref 1.4–7.0)
NEUTROPHILS RELATIVE PERCENT: 70 %
PLATELET COUNT: 191 10*3/uL (ref 150–450)
RED BLOOD CELL COUNT: 3.08 x10E6/uL — ABNORMAL LOW (ref 3.77–5.28)
RED CELL DISTRIBUTION WIDTH: 16.5 % — ABNORMAL HIGH (ref 11.7–15.4)
WHITE BLOOD CELL COUNT: 5.4 10*3/uL (ref 3.4–10.8)

## 2022-09-01 LAB — RENAL FUNCTION PANEL
ALBUMIN: 3.9 g/dL (ref 3.8–4.8)
BLOOD UREA NITROGEN: 25 mg/dL (ref 8–27)
BUN / CREAT RATIO: 11 — ABNORMAL LOW (ref 12–28)
CALCIUM: 9.6 mg/dL (ref 8.7–10.3)
CHLORIDE: 103 mmol/L (ref 96–106)
CO2: 22 mmol/L (ref 20–29)
CREATININE: 2.2 mg/dL — ABNORMAL HIGH (ref 0.57–1.00)
EGFR: 23 mL/min/{1.73_m2} — ABNORMAL LOW
GLUCOSE: 113 mg/dL — ABNORMAL HIGH (ref 70–99)
PHOSPHORUS, SERUM: 3 mg/dL (ref 3.0–4.3)
POTASSIUM: 4.5 mmol/L (ref 3.5–5.2)
SODIUM: 140 mmol/L (ref 134–144)

## 2022-09-01 LAB — MAGNESIUM: MAGNESIUM: 1.5 mg/dL — ABNORMAL LOW (ref 1.6–2.3)

## 2022-09-01 MED ORDER — MYCOPHENOLATE MOFETIL 250 MG CAPSULE
ORAL_CAPSULE | Freq: Two times a day (BID) | ORAL | 3 refills | 90 days | Status: CP
Start: 2022-09-01 — End: 2023-09-01
  Filled 2022-09-17: qty 120, 30d supply, fill #0

## 2022-09-01 MED ORDER — POLYETHYLENE GLYCOL 3350 17 GRAM/DOSE ORAL POWDER
Freq: Every day | ORAL | 11 refills | 30 days | Status: CP | PRN
Start: 2022-09-01 — End: ?

## 2022-09-01 MED ORDER — HYDRALAZINE 100 MG TABLET
ORAL_TABLET | Freq: Three times a day (TID) | ORAL | 3 refills | 90 days | Status: CP
Start: 2022-09-01 — End: 2023-09-01
  Filled 2022-09-17: qty 270, 90d supply, fill #0

## 2022-09-01 MED ORDER — PANTOPRAZOLE 40 MG TABLET,DELAYED RELEASE
ORAL_TABLET | Freq: Every day | ORAL | 3 refills | 90 days | Status: CP
Start: 2022-09-01 — End: 2023-09-01
  Filled 2022-09-17: qty 90, 90d supply, fill #0

## 2022-09-01 MED ORDER — INSULIN GLARGINE (U-100) 100 UNIT/ML (3 ML) SUBCUTANEOUS PEN
Freq: Every evening | SUBCUTANEOUS | 3 refills | 150 days | Status: CP
Start: 2022-09-01 — End: ?

## 2022-09-01 MED ORDER — CARVEDILOL 25 MG TABLET
ORAL_TABLET | Freq: Two times a day (BID) | ORAL | 11 refills | 30 days | Status: CP
Start: 2022-09-01 — End: 2023-09-01
  Filled 2022-09-17: qty 180, 90d supply, fill #0

## 2022-09-01 MED ORDER — LEVOTHYROXINE 112 MCG TABLET
ORAL_TABLET | Freq: Every day | ORAL | 3 refills | 90 days | Status: CP
Start: 2022-09-01 — End: 2023-09-01
  Filled 2022-09-17: qty 90, 90d supply, fill #0

## 2022-09-01 MED ORDER — CALCIUM CARBONATE 200 MG CALCIUM (500 MG) CHEWABLE TABLET
ORAL_TABLET | Freq: Three times a day (TID) | ORAL | 3 refills | 90 days | Status: CP
Start: 2022-09-01 — End: 2023-09-01
  Filled 2022-09-17: qty 100, 25d supply, fill #0

## 2022-09-01 MED ORDER — CLONIDINE HCL 0.1 MG TABLET
ORAL_TABLET | Freq: Three times a day (TID) | ORAL | 11 refills | 30 days | Status: CP
Start: 2022-09-01 — End: ?

## 2022-09-01 MED ORDER — ASPIRIN 81 MG TABLET,DELAYED RELEASE
ORAL_TABLET | Freq: Every day | ORAL | 3 refills | 90 days | Status: CP
Start: 2022-09-01 — End: ?

## 2022-09-01 MED ORDER — PREDNISONE 5 MG TABLET
ORAL_TABLET | Freq: Every day | ORAL | 3 refills | 90 days | Status: CP
Start: 2022-09-01 — End: ?
  Filled 2022-09-17: qty 30, 30d supply, fill #0

## 2022-09-01 MED ORDER — ATORVASTATIN 10 MG TABLET
ORAL_TABLET | Freq: Every day | ORAL | 3 refills | 90 days | Status: CP
Start: 2022-09-01 — End: ?
  Filled 2022-09-17: qty 90, 90d supply, fill #0

## 2022-09-01 MED ORDER — INSULIN ASPART (U-100) 100 UNIT/ML (3 ML) SUBCUTANEOUS PEN
SUBCUTANEOUS | 7 refills | 46 days | Status: CP
Start: 2022-09-01 — End: 2023-09-01

## 2022-09-01 MED ORDER — DEXCOM G7 SENSOR DEVICE
11 refills | 0 days | Status: CP
Start: 2022-09-01 — End: ?

## 2022-09-01 MED ORDER — VALGANCICLOVIR 450 MG TABLET
ORAL_TABLET | ORAL | 0 refills | 90 days | Status: CP
Start: 2022-09-01 — End: 2022-11-30

## 2022-09-01 MED ORDER — SULFAMETHOXAZOLE 400 MG-TRIMETHOPRIM 80 MG TABLET
ORAL_TABLET | ORAL | 1 refills | 84 days | Status: CP
Start: 2022-09-01 — End: 2023-02-28
  Filled 2022-09-17: qty 36, 84d supply, fill #0

## 2022-09-01 MED ORDER — ACETAMINOPHEN 500 MG TABLET
ORAL_TABLET | Freq: Three times a day (TID) | ORAL | 11 refills | 10 days | Status: CP | PRN
Start: 2022-09-01 — End: ?

## 2022-09-01 MED ORDER — TACROLIMUS 1 MG CAPSULE, IMMEDIATE-RELEASE
ORAL_CAPSULE | Freq: Two times a day (BID) | ORAL | 3 refills | 90 days | Status: CP
Start: 2022-09-01 — End: 2023-09-01

## 2022-09-01 MED ORDER — DOCUSATE SODIUM 100 MG CAPSULE
ORAL_CAPSULE | Freq: Two times a day (BID) | ORAL | 11 refills | 30 days | Status: CP | PRN
Start: 2022-09-01 — End: ?
  Filled 2022-09-17: qty 180, 90d supply, fill #0

## 2022-09-01 MED ORDER — CALCITRIOL 0.5 MCG CAPSULE
ORAL_CAPSULE | Freq: Every day | ORAL | 3 refills | 90 days | Status: CP
Start: 2022-09-01 — End: 2023-09-01

## 2022-09-01 MED ORDER — CHLORTHALIDONE 25 MG TABLET
ORAL_TABLET | Freq: Every day | ORAL | 11 refills | 30 days | Status: CP
Start: 2022-09-01 — End: 2023-09-01

## 2022-09-01 MED ORDER — MG-PLUS-PROTEIN 133 MG TABLET
ORAL_TABLET | Freq: Two times a day (BID) | ORAL | 11 refills | 30 days | Status: CP
Start: 2022-09-01 — End: ?
  Filled 2022-09-17: qty 180, 90d supply, fill #0

## 2022-09-01 MED ORDER — AMLODIPINE 10 MG TABLET
ORAL_TABLET | Freq: Every day | ORAL | 3 refills | 90 days | Status: CP
Start: 2022-09-01 — End: 2023-09-01
  Filled 2022-09-17: qty 90, 90d supply, fill #0

## 2022-09-01 MED ORDER — SENNOSIDES 8.6 MG TABLET
ORAL_TABLET | Freq: Every day | ORAL | 11 refills | 30 days | Status: CP | PRN
Start: 2022-09-01 — End: ?
  Filled 2022-09-17: qty 90, 90d supply, fill #0

## 2022-09-01 NOTE — Unmapped (Signed)
[facility] Specialty Medication Onboarding    Specialty Medication: Valganciclovir 450mg  tablet  Prior Authorization: Not Required   Financial Assistance: No - copay card or gant not available   Final Copay/Day Supply: $135 / 90 days    Insurance Restrictions: None     Notes to Pharmacist: Mycophenolate and Tac RFTS 02/20    APAP - $2.99  Amlodipine RFTS 02/19  ASA - $2.99  Atorvastatin RFTS 02/20  Calcitriol - $0  Tums - $5.30  Carvedilol RFTS 02/18  Chlorthalidone RFTS 02/28  Clonidine RFTS 02/18  Dexcom G7 Sensor - $68.80  Docusate - $3.44  Hydralazine RFTS 03/12  Lantus RFTS 06/03  Levothyroxine RFTS 04/06  Mag Ox - $15.98  Pantoprazole RFTS 02/20  Glycolax - $22.50  Prednisone RFTS 02/20  Senna - $2.99  Bactrim RFTS 02/19    The triage team has completed the benefits investigation and has determined that the patient is able to fill this medication at [facility]. Please contact the patient to complete the onboarding or follow up with the prescribing physician as needed.

## 2022-09-02 LAB — RENAL FUNCTION PANEL
ALBUMIN: 3.8 g/dL (ref 3.8–4.8)
BLOOD UREA NITROGEN: 33 mg/dL — ABNORMAL HIGH (ref 8–27)
BUN / CREAT RATIO: 19 (ref 12–28)
CALCIUM: 9.6 mg/dL (ref 8.7–10.3)
CHLORIDE: 102 mmol/L (ref 96–106)
CO2: 21 mmol/L (ref 20–29)
CREATININE: 1.78 mg/dL — ABNORMAL HIGH (ref 0.57–1.00)
EGFR: 30 mL/min/{1.73_m2} — ABNORMAL LOW
GLUCOSE: 155 mg/dL — ABNORMAL HIGH (ref 70–99)
PHOSPHORUS, SERUM: 3.2 mg/dL (ref 3.0–4.3)
POTASSIUM: 4.2 mmol/L (ref 3.5–5.2)
SODIUM: 139 mmol/L (ref 134–144)

## 2022-09-02 LAB — MAGNESIUM: MAGNESIUM: 1.3 mg/dL — ABNORMAL LOW (ref 1.6–2.3)

## 2022-09-02 LAB — TACROLIMUS LEVEL: TACROLIMUS BLOOD: 10 ng/mL (ref 2.0–20.0)

## 2022-09-03 LAB — TACROLIMUS LEVEL: TACROLIMUS BLOOD: 11 ng/mL (ref 2.0–20.0)

## 2022-09-04 DIAGNOSIS — Z940 Kidney transplant status: Secondary | ICD-10-CM | POA: Diagnosis not present

## 2022-09-04 LAB — RENAL FUNCTION PANEL
ALBUMIN: 4 g/dL (ref 3.8–4.8)
BLOOD UREA NITROGEN: 39 mg/dL — ABNORMAL HIGH (ref 8–27)
BUN / CREAT RATIO: 19 (ref 12–28)
CALCIUM: 9.5 mg/dL (ref 8.7–10.3)
CHLORIDE: 103 mmol/L (ref 96–106)
CO2: 20 mmol/L (ref 20–29)
CREATININE: 2.04 mg/dL — ABNORMAL HIGH (ref 0.57–1.00)
EGFR: 25 mL/min/{1.73_m2} — ABNORMAL LOW
GLUCOSE: 149 mg/dL — ABNORMAL HIGH (ref 70–99)
PHOSPHORUS, SERUM: 3.2 mg/dL (ref 3.0–4.3)
POTASSIUM: 4.4 mmol/L (ref 3.5–5.2)
SODIUM: 140 mmol/L (ref 134–144)

## 2022-09-04 LAB — MAGNESIUM: MAGNESIUM: 1.6 mg/dL (ref 1.6–2.3)

## 2022-09-04 NOTE — Unmapped (Addendum)
2/14: patient is at clinic now, would like to fill meds onsite at hospital tomorrow that patient will need this week. Patient's daughter has phone number for central outpatient pharmacy and will call them later today with what patient needs. Clinic aware. [facility] call set up for next week, patient's daughter aware to call us with any needs sooner -ef      The following medications are onboarded in this note:  Tacrolimus $31.28 not part b per [facility] fill  Mycophenolate  $12.94 not part b per [facility] fill  Valganciclovir $135 / 90 days ($45/30ds) per triage test claim, dose increase since last fill may need soon      Per triage test claims on 2/9:  Mycophenolate and Tac RFTS 02/20   APAP - $2.99  Amlodipine RFTS 02/19  ASA - $2.99  Atorvastatin RFTS 02/20  Calcitriol - $0  Tums - $5.30  Carvedilol RFTS 02/18  Chlorthalidone RFTS 02/28  Clonidine RFTS 02/18  Dexcom G7 Sensor - $68.80  Docusate - $3.44  Hydralazine RFTS 03/12  Lantus RFTS 06/03  Levothyroxine RFTS 04/06  Mag Ox - $15.98  Pantoprazole RFTS 02/20  Glycolax - $22.50  Prednisone RFTS 02/20  Senna - $2.99  Bactrim RFTS 02/19    [facility]   Patient Onboarding/Medication Counseling    Julie [name] is a 72 y.o. female with kidney transplant who I am counseling today on continuation of therapy.  I am speaking to the patient's family member, daughter .    Was a Nurse, learning disability used for this call? No    Verified patient's date of birth / HIPAA.    Specialty medication(s) to be sent: n/a      Non-specialty medications/supplies to be sent: n/a      Medications not needed at this time: n/a         The patient declined counseling on medication administration, missed dose instructions, goals of therapy, side effects and monitoring parameters, warnings and precautions, drug/food interactions, and storage, handling precautions, and disposal because they have taken the medication previously. The information in the declined sections below are for informational purposes only and was not discussed with patient.   Prograf (tacrolimus)    Medication & Administration     Dosage: take 3 capsules (3mg  total) by mouth twice daily - notice dose change since discharge, rts 2/20     Administration:   May take with or without food  Take 12 hours apart    Adherence/Missed dose instructions:  Take a missed dose as soon as you think about it.  If it is close to the time for your next dose, skip the missed dose and go back to your normal time.  Do not take 2 doses at the same time or extra doses.    Goals of Therapy     To prevent organ rejection    Side Effects & Monitoring Parameters     Common side effects  Dizziness  Fatigue  Headache  Stuffy nose or sore throat  Nausea, vomiting, stomach pain, diarrhea, constipation  Heartburn  Back or joint pain  Increased risk of infection    The following side effects should be reported to the provider:  Allergic reaction  Kidney issues (change in quantity or urine passed, blood in urine, or weight gain)  High blood pressure (dizziness, change in eyesight, headache)  Electrolyte issues (change in mood, confusion, muscle pain, or weakness)  Abnormal breathing  Shakiness  Unexplained bleeding or bruising (gums bleeding, blood  in urine, nosebleeds, any abnormal bleeding)  Signs of infection (fever, cough, wounds that will not heal)  Skin changes (sores, paleness, new or changed bumps or moles)    Monitoring Parameters  Renal function  Liver function  Glucose levels  Blood pressure  Tacrolimus trough levels  Cardiac monitoring (for QT prolongation)      Contraindications, Warnings, & Precautions     Black Box Warning: Infections - immunosuppressant agents increase the risk of infection that may lead to hospitalization or death  Black Box Warning: Malignancy - immunosuppressant agents may be associated with the development of malignancies that may lead to hospitalization or death  Limit or avoid sun and ultraviolet light exposure, use appropriate sun protection  Myocardial hypertrophy -avoid use in patients with congenital long QT syndrome  Diabetes mellitus - the risk for new-onset diabetes and insulin-dependent post-transplant diabetes mellitus is increased with tacrolimus use after transplantation  GI perforation  Hyperkalemia  Hypertension  Nephrotoxicity  Neurotoxicity  This is a narrow therapeutic index drug. Do not switch manufacturers without first talking to the provider.    Drug/Food Interactions     Medication list reviewed in Epic. The patient was instructed to inform the care team before taking any new medications or supplements.  No interactions noted that clinic is not already monitoring .   Avoid alcohol  Avoid grapefruit or grapefruit juice  Avoid live vaccines    Storage, Handling Precautions, & Disposal     Store at room temperature  Keep away from children and pets  The patient declined counseling on medication administration, missed dose instructions, goals of therapy, side effects and monitoring parameters, warnings and precautions, drug/food interactions, and storage, handling precautions, and disposal because they have taken the medication previously. The information in the declined sections below are for informational purposes only and was not discussed with patient.   Cellcept (mycopheonlate mofetil)    Medication & Administration     Dosage: Take 2 capsules (500mg  total) by mouth twice daily - note dose change since discharge, rts 2/20    Administration:   Take by mouth with or without food.   Taking with food can minimize GI side effects.   Swallow capsules whole, do not crush or chew.  Oral suspension should be shaken well prior to administration.  Do not mix with other medications and discard any unused portion 60 days after constitution.      Adherence/Missed dose instructions:  Take a missed dose as soon as you remember it . If it is close to the time of your next dose, skip the missed dose and resume your normal schedule.Never take 2 doses to try and catch up from a missed dose.    Goals of Therapy     Prevent organ rejection    Side Effects & Monitoring Parameters     Feeling tired or weak  Shakiness  Trouble sleeping  Diarrhea, abdominal pain, nausea, vomiting, constipation or decreased appetite  Decreases in blood counts   Back or joint pain  Hypertension or hypotension  High blood sugar  Headache  Skin rash    The following side effects should be reported to the provider:  Reduced immune function - report signs of infection such as fever; chills; body aches; very bad sore throat; ear or sinus pain; cough; more sputum or change in color of sputum; pain with passing urine; wound that will not heal, etc.  Also at a slightly higher risk of some malignancies (mainly skin and blood cancers)  due to this reduced immune function.  Allergic reaction (rash, hives, swelling, shortness of breath)  High blood sugar (confusion, feeling sleepy, more thirst, more hungry, passing urine more often, flushing, fast breathing, or breath that smells like fruit)  Electrolyte issues (mood changes, confusion, muscle pain or weakness, a heartbeat that does not feel normal, seizures, not hungry, or very bad upset stomach or throwing up)  High or low blood pressure (bad headache or dizziness, passing out, or change in eyesight)  Kidney issues (unable to pass urine, change in how much urine is passed, blood in the urine, or a big weight gain)  Skin (oozing, heat, swelling, redness, or pain), UTI and other infections   Chest pain or pressure  Abnormal heartbeat  Unexplained bleeding or bruising  Abnormal burning, numbness, or tingling  Muscle cramps,  Yellowing of skin or eyes    Monitoring parameters  Pregnancy   CBC   Renal and hepatic function    Contraindications, Warnings, & Precautions     *This is a REMS drug and an FDA-approved patient medication guide will be printed with each dispensation  Black Box Warning: Infections   Black Box Warning: Lymphoproliferative disorders - risk of development of lymphoma and skin malignancy is increased  Black Box Warning: Use during pregnancy is associated with increased risks of first trimester pregnancy loss and congenital malformations.   Black Box Warning: Females of reproductive potential should use contraception during treatment and for 6 weeks after therapy is discontinued  Is patient using an effective method of contraception? Yes  If yes, method of contraception:  menopause and not sexually active  CNS depression  New or reactivated viral infections  Neutropenia  Female patients and/or their female partners should use effective contraception during treatment of the female patient and for at least 3 months after last dose.  Breastfeeding is not recommended during therapy and for 6 weeks after last dose    Drug/Food Interactions     Medication list reviewed in Epic. The patient was instructed to inform the care team before taking any new medications or supplements.  No interactions noted that clinic is not already monitoring .   Separate doses of antacids and this medication  Check with your doctor before getting any vaccinations    Storage, Handling Precautions, & Disposal     Store at room temperature in a dry place  This medication is considered hazardous. Wash hands after handling and store out of reach or others, including children and pets.  The patient declined counseling on medication administration, missed dose instructions, goals of therapy, side effects and monitoring parameters, warnings and precautions, drug/food interactions, and storage, handling precautions, and disposal because they have taken the medication previously. The information in the declined sections below are for informational purposes only and was not discussed with patient.   Valcyte (valganciclovir)    Medication & Administration     Dosage:   Take 1 tablet (450mg  total) by mouth every other day - note dose change since discharge    Administration:   Take with food  Swallow the pills whole, do not break, crush, or chew    Adherence/Missed dose instructions:  Take a missed dose as soon as you think about it with food  If it is close to your next dose, skip the missed dose and go back to your normal time.  Do not take 2 doses at the same time or extra doses.  Report any missed doses to coordinator  Goals of Therapy     To prevent or treat CMV infection in setting of solid organ transplant    Side Effects & Monitoring Parameters   Common side effects  Headache  Diarrhea or constipation  Appetite or sleep disturbances  Back, muscle, joint, or belly pain  Weight loss  Dizziness  Muscle spasm  Upset stomach or vomiting    The following side effects should be reported to the provider:  Allergic reaction  (rash, hives, swelling, blistered or peeling skin, shortness of breath)  Infection (fever, chills, sore throat, ear/sinus pain, cough, sputum change, urinary pain, mouth sores, non-healing wounds)  Bleeding (cough ground vomit, blood in urine, black/red/tarry stools, unexplained bruising or bleeding)  Electrolyte problems (mood changes, confusion, weakness, abnormal heartbeat, seizures)  Kidney problems (urine changes, weight gain)  Yellowing skin or eyes  Swelling in arms, legs, stomach  Severe dizziness or passing out  Eye issues (eyesight changes, pain, or irritation)  Night sweats    Monitoring parameters  Have eye exam as directed by doctor  CMV counts  CBC  Renal function  Pregnancy test prior to initiation    Contraindications, Warnings, & Precautions   BBW: severe leukopenia, neutropenia, anemia, thrombocytopenia, pancytopenia, and bone marrow failure, including aplastic anemia have been reported  BBW: may cause temporary or permanent inhibition of spermatogenesis and suppression of fertilty; has the potential to cause birth defects and cancers in humans  Female patients should have pregnancy test prior to initiation and use birth control for at least 30 days after discontinuation  Female patients should use a barrier contraceptive while on therapy and for 90 days after discontinuation  Acute renal failure  Not indicated for use in liver transplant recipients  Breastfeeding is not recommended    Drug/Food Interactions   Medication list reviewed in Epic. The patient was instructed to inform the care team before taking any new medications or supplements.  No interactions noted that clinic is not already monitoring .   Check with your doctor before getting any vaccinations (live or inactivated)    Storage, Handling Precautions, & Disposal   Store at room temperature  Keep away from children and pets      Current Medications (including OTC/herbals), Comorbidities and Allergies     Current Outpatient Medications   Medication Sig Dispense Refill    acetaminophen (TYLENOL) 500 MG tablet Take 1-2 tablets (500-1,000 mg total) by mouth every eight (8) hours as needed for pain or fever. 60 tablet 11    amlodipine (NORVASC) 10 MG tablet Take 1 tablet (10 mg total) by mouth daily. 90 tablet 3    aspirin (ECOTRIN) 81 MG tablet Take 1 tablet (81 mg total) by mouth daily. 90 tablet 3    atorvastatin (LIPITOR) 10 MG tablet Take 1 tablet (10 mg total) by mouth daily. 90 tablet 3    blood sugar diagnostic (ACCU-CHEK AVIVA PLUS TEST STRP) Strp USE 1 STRIP TO CHECK GLUCOSE THREE TIMES DAILY      blood sugar diagnostic (GLUCOSE BLOOD) Strp Use to check blood sugar as directed with insulin 3 times a day & for symptoms of high or low blood sugar. 100 strip 11    blood sugar diagnostic Strp USE 1 STRIP TO CHECK GLUCOSE THREE TIMES DAILY      blood-glucose meter kit Use as directed. ACCU-CHEK AVIVA      blood-glucose meter kit Use as instructed 1 each 0    blood-glucose sensor (DEXCOM G7 SENSOR) Devi Use 1 sensor every  10 days. 3 each 11    calcitriol (ROCALTROL) 0.5 MCG capsule Take 1 capsule (0.5 mcg total) by mouth daily. 90 capsule 3    calcium carbonate (TUMS) 200 mg calcium (500 mg) chewable tablet Chew 2 tablets (400 mg of elem calcium total) Three (3) times a day. 540 tablet 3    carvedilol (COREG) 25 MG tablet Take 1 tablet (25 mg total) by mouth two (2) times a day. 60 tablet 11    chlorthalidone (HYGROTON) 25 MG tablet Take 1 tablet (25 mg total) by mouth daily. 30 tablet 11    cloNIDine HCL (CATAPRES) 0.1 MG tablet Take 1 tablet (0.1 mg total) by mouth Three (3) times a day. 90 tablet 11    docusate sodium (COLACE) 100 MG capsule Take 1 capsule (100 mg total) by mouth two (2) times a day as needed for constipation. 60 capsule 11    hydrALAZINE (APRESOLINE) 100 MG tablet Take 1 tablet (100 mg total) by mouth every eight (8) hours. 270 tablet 3    insulin aspart (NOVOLOG FLEXPEN U-100 INSULIN) 100 unit/mL (3 mL) injection pen Inject 0.03 mL (3 Units total) under the skin Three (3) times a day before meals AND 0-0.08 mL (0-8 Units total) Three (3) times a day before meals. Max 33 units per day. 15 mL 7    insulin glargine (BASAGLAR, LANTUS) 100 unit/mL (3 mL) injection pen Inject 0.05-0.1 mL (5-10 Units total) under the skin nightly. 15 mL 3    lancets Misc Use to check blood sugar as directed with insulin 3 times a day & for symptoms of high or low blood sugar. 100 each 0    levoFLOXacin (LEVAQUIN) 500 MG tablet Take 1 tablet (500 mg total) by mouth every other day for 10 doses. 10 tablet 0    levothyroxine (SYNTHROID) 112 MCG tablet Take 1 tablet (112 mcg total) by mouth daily. 90 tablet 3    magnesium oxide-Mg AA chelate (MAGNESIUM, AMINO ACID CHELATE,) 133 mg Take 1 tablet by mouth two (2) times a day. Hold until directed to take by nurse coordinator 60 tablet 11    mycophenolate (CELLCEPT) 250 mg capsule Take 2 capsules (500 mg total) by mouth two (2) times a day. 360 capsule 3    nystatin (MYCOSTATIN) 100,000 unit/mL suspension Take 5 mL (500,000 Units total) by mouth four (4) times a day for 14 days. 280 mL 0    pantoprazole (PROTONIX) 40 MG tablet Take 1 tablet (40 mg total) by mouth daily. 90 tablet 3    pen needle, diabetic 32 gauge x 5/32 (4 mm) Ndle Use with insulin up to 4 times/day as needed. 100 each 11    pen needle, diabetic 32 gauge x 5/32 Ndle Use with insulin 2 times daily      polyethylene glycol (GLYCOLAX) 17 gram/dose powder Take 17 g by mouth daily as needed. 510 g 11    predniSONE (DELTASONE) 5 MG tablet Take 1 tablet (5 mg total) by mouth in the morning. 90 tablet 3    SENNA 8.6 mg tablet Take 1 tablet by mouth daily as needed for constipation. 30 tablet 11    sulfamethoxazole-trimethoprim (BACTRIM) 400-80 mg per tablet Take 1 tablet (80 mg of trimethoprim total) by mouth Every Monday, Wednesday, and Friday. 36 tablet 1    tacrolimus (PROGRAF) 1 MG capsule Take 3 capsules (3 mg total) by mouth two (2) times a day. 540 capsule 3    valGANciclovir (VALCYTE) 450 mg tablet Take  1 tablet (450 mg total) by mouth every other day. 45 tablet 0     No current facility-administered medications for this visit.       Allergies   Allergen Reactions    Penicillins Other (See Comments)     patient denies PCN allergy and states she had formal allergy testing that ruled out PCN allergy. 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 coli UTI       Reviewed and up to date in Epic.    Appropriateness of Therapy     Acute infections noted within Epic:  No active infections  Patient reported infection: None    Is medication and dose appropriate based on diagnosis and infection status? Yes    Prescription has been clinically reviewed: Yes      Baseline Quality of Life Assessment      How many days over the past month did your transplant  keep you from your normal activities? For example, brushing your teeth or getting up in the morning. 0    Financial Information     Medication Assistance provided: none available for valganciclovir, rts on tac and mycophenolate but likely will need referrals entered in future    Anticipated copay of $  Tacrolimus $31.28 not part b per [facility] fill  Mycophenolate  $12.94 not part b per [facility] fill  Valganciclovir $135 / 90 days per triage test claim, dose increase since last fill   reviewed with patient. Verified delivery address.    Delivery Information     Scheduled delivery date: n/a- call set up for 1 week out based on supplies on hand    Expected start date: patient is currently taking all medications already    Medication will be delivered via  n/a  to the  n/a  address in Epic [place].  This shipment will not require a signature.      Explained the services we provide at [facility] and that each month we would call to set up refills.  Stressed importance of returning phone calls so that we could ensure they receive their medications in time each month.  Informed patient that we should be setting up refills 7-10 days prior to when they will run out of medication.  A pharmacist will reach out to perform a clinical assessment periodically.  Informed patient that a welcome packet, containing information about our pharmacy and other support services, a Notice of Privacy Practices, and a drug information handout will be sent.      The patient or caregiver noted above participated in the development of this care plan and knows that they can request review of or adjustments to the care plan at any time.      Patient or caregiver verbalized understanding of the above information as well as how to contact the pharmacy at [phone]  option 4 with any questions/concerns.  The pharmacy is open Monday through Friday 8:30am-4:30pm.  A pharmacist is available 24/7 via pager to answer any clinical questions they may have.    Patient Specific Needs     Does the patient have any physical, cognitive, or cultural barriers? No    Does the patient have adequate living arrangements? (i.e. the ability to store and take their medication appropriately) Yes    Did you identify any home environmental safety or security hazards? No    Patient prefers to have medications discussed with  Family Member     Is the patient or caregiver able to read and understand education materials at a high school level or above? Yes    Patient's primary language is   [place], daughter Lenox Ponds      Is the patient high risk? Yes, patient is taking a REMS drug. Medication is dispensed in compliance with REMS program    SOCIAL DETERMINANTS OF HEALTH     At the [facility], we have learned that life circumstances - like trouble affording food, housing, utilities, or transportation can affect the health of many of our patients.   That is why we wanted to ask: are you currently experiencing any life circumstances that are negatively impacting your health and/or quality of life? Patient declined to answer    Social Determinants of Health     Financial Resource Strain: Low Risk  (04/06/2018)    Received from [facility]    Overall Financial Resource Strain (CARDIA)     Difficulty of Paying Living Expenses: Not hard at all   Internet Connectivity: Not on file   Food Insecurity: No Food Insecurity (02/28/2022)    Received from [facility]    Hunger Vital Sign     Worried About Running Out of Food in the Last Year: Never true     Ran Out of Food in the Last Year: Never true   Tobacco Use: Medium Risk (08/30/2022)    Patient History     Smoking Tobacco Use: Former     Smokeless Tobacco Use: Former     Passive Exposure: Not on file Housing/Utilities: Not on file   Alcohol Use: Not on file   Transportation Needs: No Transportation Needs (02/28/2022)    Received from [facility] - [facility]     Lack of Transportation (Medical): No     Lack of Transportation (Non-Medical): No   Substance Use: Not on file   Health Literacy: Not on file   Physical Activity: Not on file   Interpersonal Safety: Not on file   Stress: Not on file   Intimate Partner Violence: Not At Risk (04/06/2018)    Received from [facility]    Humiliation, Afraid, Rape, and Kick questionnaire     Fear of Current or Ex-Partner: No     Emotionally Abused: No     Physically Abused: No     Sexually Abused: No   Depression: Not at risk (03/07/2022)    Received from [facility]    PHQ-2     Total Score =: 0   Social Connections: Unknown (04/06/2018)    Received from [facility]    Social Connection and Isolation Panel [NHANES]     Frequency of Communication with Friends and Family: Not on file     Frequency of Social Gatherings with Friends and Family: Not on file     Attends Religious Services: Not on file  Active Member of Clubs or Organizations: Not on file     Attends Banker Meetings: Not on file     Marital Status: Married       Would you be willing to receive help with any of the needs that you have identified today? Not applicable       Thad Ranger, PharmD  [facility] Specialty Pharmacist

## 2022-09-04 NOTE — Unmapped (Signed)
Reviewed recent labs with Dr. Elvera [name], pt advised to start magnesium 1 tablet bid and to decrease tacrolimus to 3 mg in the morning and 2 mg in the evening.  Spoke to pts daughter Julie [name] to make medication changes.  Julie [name] reports BP 150-160/50-60s and BG am 150 and meal s 200s.  She started lantus 5 units daily.  Reviewed appts for this week.

## 2022-09-04 NOTE — Unmapped (Signed)
Received notification from inpatient team that patient will fill at [facility] after discharge.  Patient has been enrolled in specialty calls at [facility].  Onboarding/welcome call set up pending discharge.

## 2022-09-05 ENCOUNTER — Institutional Professional Consult (permissible substitution): Admit: 2022-09-05 | Discharge: 2022-09-06 | Payer: MEDICARE

## 2022-09-05 DIAGNOSIS — D631 Anemia in chronic kidney disease: Secondary | ICD-10-CM | POA: Diagnosis not present

## 2022-09-05 DIAGNOSIS — I2510 Atherosclerotic heart disease of native coronary artery without angina pectoris: Secondary | ICD-10-CM | POA: Diagnosis not present

## 2022-09-05 DIAGNOSIS — Z4822 Encounter for aftercare following kidney transplant: Secondary | ICD-10-CM | POA: Diagnosis not present

## 2022-09-05 DIAGNOSIS — E039 Hypothyroidism, unspecified: Secondary | ICD-10-CM | POA: Diagnosis not present

## 2022-09-05 DIAGNOSIS — N189 Chronic kidney disease, unspecified: Secondary | ICD-10-CM | POA: Diagnosis not present

## 2022-09-05 DIAGNOSIS — I351 Nonrheumatic aortic (valve) insufficiency: Secondary | ICD-10-CM | POA: Diagnosis not present

## 2022-09-05 DIAGNOSIS — I1310 Hypertensive heart and chronic kidney disease without heart failure, with stage 1 through stage 4 chronic kidney disease, or unspecified chronic kidney disease: Secondary | ICD-10-CM | POA: Diagnosis not present

## 2022-09-05 DIAGNOSIS — I700 Atherosclerosis of aorta: Secondary | ICD-10-CM | POA: Diagnosis not present

## 2022-09-05 DIAGNOSIS — E1122 Type 2 diabetes mellitus with diabetic chronic kidney disease: Secondary | ICD-10-CM | POA: Diagnosis not present

## 2022-09-05 DIAGNOSIS — Z79899 Other long term (current) drug therapy: Principal | ICD-10-CM

## 2022-09-05 DIAGNOSIS — Z940 Kidney transplant status: Principal | ICD-10-CM

## 2022-09-05 DIAGNOSIS — R75 Inconclusive laboratory evidence of human immunodeficiency virus [HIV]: Principal | ICD-10-CM

## 2022-09-05 LAB — CBC W/ DIFFERENTIAL
BANDED NEUTROPHILS ABSOLUTE COUNT: 0 10*3/uL (ref 0.0–0.1)
BASOPHILS ABSOLUTE COUNT: 0 10*3/uL (ref 0.0–0.2)
BASOPHILS RELATIVE PERCENT: 0 %
EOSINOPHILS ABSOLUTE COUNT: 0 10*3/uL (ref 0.0–0.4)
EOSINOPHILS RELATIVE PERCENT: 0 %
HEMATOCRIT: 30.3 % — ABNORMAL LOW (ref 34.0–46.6)
HEMOGLOBIN: 10.1 g/dL — ABNORMAL LOW (ref 11.1–15.9)
IMMATURE GRANULOCYTES: 1 %
LYMPHOCYTES ABSOLUTE COUNT: 0.8 10*3/uL (ref 0.7–3.1)
LYMPHOCYTES RELATIVE PERCENT: 18 %
MEAN CORPUSCULAR HEMOGLOBIN CONC: 33.3 g/dL (ref 31.5–35.7)
MEAN CORPUSCULAR HEMOGLOBIN: 31.1 pg (ref 26.6–33.0)
MEAN CORPUSCULAR VOLUME: 93 fL (ref 79–97)
MONOCYTES ABSOLUTE COUNT: 0.4 10*3/uL (ref 0.1–0.9)
MONOCYTES RELATIVE PERCENT: 9 %
NEUTROPHILS ABSOLUTE COUNT: 3.4 10*3/uL (ref 1.4–7.0)
NEUTROPHILS RELATIVE PERCENT: 72 %
PLATELET COUNT: 189 10*3/uL (ref 150–450)
RED BLOOD CELL COUNT: 3.25 x10E6/uL — ABNORMAL LOW (ref 3.77–5.28)
RED CELL DISTRIBUTION WIDTH: 16.3 % — ABNORMAL HIGH (ref 11.7–15.4)
WHITE BLOOD CELL COUNT: 4.8 10*3/uL (ref 3.4–10.8)

## 2022-09-05 LAB — TACROLIMUS LEVEL: TACROLIMUS BLOOD: 12.7 ng/mL (ref 2.0–20.0)
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
## 2022-09-05 NOTE — Unmapped (Signed)
Reviewed recent BG levels with Knox Saliva PharmD, pt advised to increase lantus to 7 units daily.  She took her dose today, will start tomorrow.  Reviewed with pts daughter Lorene Dy.  Pt will get labs at [facility] tomorrow am prior to appt.  Pt reports fatigue and worsening memory since transplant.  Will discuss further tomorrow.

## 2022-09-06 ENCOUNTER — Ambulatory Visit: Admit: 2022-09-06 | Discharge: 2022-09-06 | Payer: MEDICARE

## 2022-09-06 ENCOUNTER — Institutional Professional Consult (permissible substitution): Admit: 2022-09-06 | Discharge: 2022-09-06 | Payer: MEDICARE

## 2022-09-06 ENCOUNTER — Encounter: Admit: 2022-09-06 | Discharge: 2022-09-06 | Payer: MEDICARE

## 2022-09-06 DIAGNOSIS — D84821 Immunodeficiency due to drugs: Secondary | ICD-10-CM | POA: Diagnosis not present

## 2022-09-06 DIAGNOSIS — N186 End stage renal disease: Secondary | ICD-10-CM | POA: Diagnosis not present

## 2022-09-06 DIAGNOSIS — D849 Immunodeficiency, unspecified: Secondary | ICD-10-CM | POA: Diagnosis not present

## 2022-09-06 DIAGNOSIS — E039 Hypothyroidism, unspecified: Secondary | ICD-10-CM | POA: Diagnosis not present

## 2022-09-06 DIAGNOSIS — I151 Hypertension secondary to other renal disorders: Secondary | ICD-10-CM | POA: Diagnosis not present

## 2022-09-06 DIAGNOSIS — Z794 Long term (current) use of insulin: Secondary | ICD-10-CM | POA: Diagnosis not present

## 2022-09-06 DIAGNOSIS — Z79899 Other long term (current) drug therapy: Secondary | ICD-10-CM | POA: Diagnosis not present

## 2022-09-06 DIAGNOSIS — Z4822 Encounter for aftercare following kidney transplant: Secondary | ICD-10-CM | POA: Diagnosis not present

## 2022-09-06 DIAGNOSIS — R75 Inconclusive laboratory evidence of human immunodeficiency virus [HIV]: Secondary | ICD-10-CM | POA: Diagnosis not present

## 2022-09-06 DIAGNOSIS — Z940 Kidney transplant status: Secondary | ICD-10-CM | POA: Diagnosis not present

## 2022-09-06 DIAGNOSIS — E1122 Type 2 diabetes mellitus with diabetic chronic kidney disease: Secondary | ICD-10-CM | POA: Diagnosis not present

## 2022-09-06 DIAGNOSIS — I120 Hypertensive chronic kidney disease with stage 5 chronic kidney disease or end stage renal disease: Secondary | ICD-10-CM | POA: Diagnosis not present

## 2022-09-06 LAB — CBC W/ AUTO DIFF
BASOPHILS ABSOLUTE COUNT: 0 10*9/L (ref 0.0–0.1)
BASOPHILS RELATIVE PERCENT: 0.6 %
EOSINOPHILS ABSOLUTE COUNT: 0 10*9/L (ref 0.0–0.5)
EOSINOPHILS RELATIVE PERCENT: 0.5 %
HEMATOCRIT: 29.6 % — ABNORMAL LOW (ref 34.0–44.0)
HEMOGLOBIN: 10 g/dL — ABNORMAL LOW (ref 11.3–14.9)
LYMPHOCYTES ABSOLUTE COUNT: 0.8 10*9/L — ABNORMAL LOW (ref 1.1–3.6)
LYMPHOCYTES RELATIVE PERCENT: 19.4 %
MEAN CORPUSCULAR HEMOGLOBIN CONC: 33.8 g/dL (ref 32.0–36.0)
MEAN CORPUSCULAR HEMOGLOBIN: 31.9 pg (ref 25.9–32.4)
MEAN CORPUSCULAR VOLUME: 94.4 fL (ref 77.6–95.7)
MEAN PLATELET VOLUME: 7.2 fL (ref 6.8–10.7)
MONOCYTES ABSOLUTE COUNT: 0.4 10*9/L (ref 0.3–0.8)
MONOCYTES RELATIVE PERCENT: 9.2 %
NEUTROPHILS ABSOLUTE COUNT: 2.9 10*9/L (ref 1.8–7.8)
NEUTROPHILS RELATIVE PERCENT: 70.3 %
PLATELET COUNT: 150 10*9/L (ref 150–450)
RED BLOOD CELL COUNT: 3.14 10*12/L — ABNORMAL LOW (ref 3.95–5.13)
RED CELL DISTRIBUTION WIDTH: 19.7 % — ABNORMAL HIGH (ref 12.2–15.2)
WBC ADJUSTED: 4.1 10*9/L (ref 3.6–11.2)

## 2022-09-06 LAB — URINALYSIS WITH MICROSCOPY
BILIRUBIN UA: NEGATIVE
BLOOD UA: NEGATIVE
GLUCOSE UA: NEGATIVE
HYALINE CASTS: 1 /LPF (ref 0–1)
KETONES UA: NEGATIVE
NITRITE UA: NEGATIVE
PH UA: 5.5 (ref 5.0–9.0)
PROTEIN UA: NEGATIVE
RBC UA: 20 /HPF — ABNORMAL HIGH (ref <4)
SPECIFIC GRAVITY UA: 1.015 (ref 1.005–1.030)
SQUAMOUS EPITHELIAL: 5 /HPF (ref 0–5)
UROBILINOGEN UA: 0.2
WBC UA: 6 /HPF — ABNORMAL HIGH (ref 0–5)

## 2022-09-06 LAB — BK VIRUS QUANTITATIVE PCR, BLOOD: BK BLOOD RESULT: NOT DETECTED

## 2022-09-06 LAB — RENAL FUNCTION PANEL
ALBUMIN: 3.4 g/dL (ref 3.4–5.0)
ANION GAP: 8 mmol/L (ref 5–14)
BLOOD UREA NITROGEN: 42 mg/dL — ABNORMAL HIGH (ref 9–23)
BUN / CREAT RATIO: 18
CALCIUM: 10 mg/dL (ref 8.7–10.4)
CHLORIDE: 104 mmol/L (ref 98–107)
CO2: 26.1 mmol/L (ref 20.0–31.0)
CREATININE: 2.39 mg/dL — ABNORMAL HIGH
EGFR CKD-EPI (2021) FEMALE: 21 mL/min/{1.73_m2} — ABNORMAL LOW (ref >=60)
GLUCOSE RANDOM: 149 mg/dL (ref 70–179)
PHOSPHORUS: 3.5 mg/dL (ref 2.4–5.1)
POTASSIUM: 4.5 mmol/L (ref 3.4–4.8)
SODIUM: 138 mmol/L (ref 135–145)

## 2022-09-06 LAB — CMV DNA, QUANTITATIVE, PCR: CMV VIRAL LD: NOT DETECTED

## 2022-09-06 LAB — HIV RNA, QUANTITATIVE, PCR: HIV RNA QNT RSLT: NOT DETECTED

## 2022-09-06 LAB — PROTEIN / CREATININE RATIO, URINE
CREATININE, URINE: 63.9 mg/dL
PROTEIN URINE: 20.5 mg/dL
PROTEIN/CREAT RATIO, URINE: 0.321

## 2022-09-06 LAB — MAGNESIUM: MAGNESIUM: 1.7 mg/dL (ref 1.6–2.6)

## 2022-09-06 MED ORDER — VALGANCICLOVIR 450 MG TABLET
ORAL_TABLET | ORAL | 0 refills | 90 days | Status: CP
Start: 2022-09-06 — End: 2022-12-05
  Filled 2022-09-07: qty 45, 90d supply, fill #0

## 2022-09-06 MED ORDER — DEXCOM G7 SENSOR DEVICE
11 refills | 0 days | Status: CP
Start: 2022-09-06 — End: ?
  Filled 2022-09-07: qty 3, 30d supply, fill #0

## 2022-09-06 MED ORDER — CLONIDINE 0.3 MG/24 HR WEEKLY TRANSDERMAL PATCH
MEDICATED_PATCH | TRANSDERMAL | 0 refills | 28 days | Status: CP
Start: 2022-09-06 — End: 2022-10-06

## 2022-09-06 MED ORDER — TACROLIMUS 1 MG CAPSULE, IMMEDIATE-RELEASE
ORAL_CAPSULE | Freq: Two times a day (BID) | ORAL | 11 refills | 30 days | Status: CP
Start: 2022-09-06 — End: 2023-09-06
  Filled 2022-09-17: qty 120, 30d supply, fill #0

## 2022-09-06 MED ORDER — CALCITRIOL 0.5 MCG CAPSULE
ORAL_CAPSULE | Freq: Every day | ORAL | 3 refills | 90 days | Status: CP
Start: 2022-09-06 — End: 2022-09-06

## 2022-09-06 NOTE — Unmapped (Signed)
Fever: No  Chills: No    Pain: no    Painful urination:No    Nausea: No  Vomiting: No  Diarrhea: No    Chest Pain: No    Tremors: No  Headaches: No  Numbness/Tingling: No    Edema: No  SOB: No  Intake: 40-60 oz  Output: 500 ml daily    BP readings:  160-180/50-60s  Dizziness/Lightheadedness:  No    Other issues/concerns: decreased appetite and memory since transplant
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
## 2022-09-06 NOTE — Unmapped (Unsigned)
[facility] CLINIC PHARMACY NOTE  Eveleigh Espeland  [id number]    Medication changes today:   1. Start Lantus 5 units daily (daughter will give at midday)  2. Decrease Novolog to 3 units TID AC + SSI  3. Aranesp 100 mcg given in clinic  4. Increase Valcyte to 450 mg every other day  5. Start Nystatin 5 mL QID    Education/Adherence tools provided today:  - Provided updated medication list  - Provided additional education on immunosuppression and transplant related medications including reviewing indications of medications, dosing and side effects  - Discussed adherence reminder tools such as cell phone alarms    Follow up items:  1. Goal of understanding indications and dosing of immunosuppression medications  2. BP  3. BG, insulin adjustments  4. Valcyte renal dose adjustment  5. Trush  6. Levofloxacin dose    Next visit with pharmacy in 1-2 weeks  ____________________________________________________________________    Lynnda Child is a 72 y.o. 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     Immunologic Risk: cPRA 0,, HLA MM 5/6,, and first transplant    Induction Agent : basiliximab    Donor Factors: KDPI 56 and DCD    Other PMH significant for Diabetes and Hypothyroidism    Post op course complicated by infection    Rejection History: NTD  Infection History: E Coli bacteremia 07/2022  ___________________________________________________________________    Last seen by pharmacy 7 days ago    Interval History:   Lantus increased to 7 units 2/14    Seen by pharmacy today for: medication management, blood glucose management and education, and pill box fill and adherence education    CC:  Patient has no complaints today     General: No issues  Neuro:  None per patient. Daughters helping with medications and cares.  CV: No issues  Resp: No issues  GI: No issues  GU: No issues  Derm: No issues  Psych: No issues.    Fluid Status:   Edema no, SOB no      There were no vitals filed for this visit.    ___________________________________________________________________    Allergies   Allergen Reactions    Penicillins Other (See Comments)     patient denies PCN allergy and states she had formal allergy testing that ruled out PCN allergy. 09/27/18    Prior allergy comment states hypotension and rash with PCN, patient does not recall this    Oxycodone Nausea And Vomiting     Per Pt cannot tolerate oxycodone    Valacyclovir Nausea Only       Medications reviewed in EPIC medication station and updated today by the clinical pharmacist practitioner.    Current Outpatient Medications   Medication Instructions    acetaminophen (TYLENOL) 500-1,000 mg, Oral, Every 8 hours PRN    amlodipine (NORVASC) 10 mg, Oral, Daily (standard)    aspirin (ECOTRIN) 81 mg, Oral, Daily (standard)    atorvastatin (LIPITOR) 10 mg, Oral, Daily (standard)    blood sugar diagnostic (ACCU-CHEK AVIVA PLUS TEST STRP) Strp USE 1 STRIP TO CHECK GLUCOSE THREE TIMES DAILY    blood sugar diagnostic (GLUCOSE BLOOD) Strp Use to check blood sugar as directed with insulin 3 times a day & for symptoms of high or low blood sugar.    blood sugar diagnostic Strp USE 1 STRIP TO CHECK GLUCOSE THREE TIMES DAILY    blood-glucose meter kit Use as directed. ACCU-CHEK  AVIVA    blood-glucose meter kit Use as instructed    blood-glucose sensor (DEXCOM G7 SENSOR) Devi Use 1 sensor every 10 days.    calcitriol (ROCALTROL) 0.5 mcg, Oral, Daily (standard)    calcium carbonate (TUMS) 200 mg calcium (500 mg) chewable tablet 400 mg of elem calcium, Oral, 3 times a day (standard)    carvedilol (COREG) 25 mg, Oral, 2 times a day (standard)    chlorthalidone (HYGROTON) 25 mg, Oral, Daily (standard)    cloNIDine HCL (CATAPRES) 0.1 mg, Oral, 3 times a day (standard)    docusate sodium (COLACE) 100 mg, Oral, 2 times a day PRN    hydrALAZINE (APRESOLINE) 100 mg, Oral, Every 8 hours    insulin aspart (NOVOLOG FLEXPEN U-100 INSULIN) 100 unit/mL (3 mL) injection pen Inject 0.03 mL (3 Units total) under the skin Three (3) times a day before meals AND 0-0.08 mL (0-8 Units total) Three (3) times a day before meals. Max 33 units per day.    insulin glargine (BASAGLAR, LANTUS) 5-10 Units, Subcutaneous, Nightly    lancets Misc Use to check blood sugar as directed with insulin 3 times a day & for symptoms of high or low blood sugar.    levoFLOXacin (LEVAQUIN) 500 mg, Oral, Every other day    levothyroxine (SYNTHROID) 112 mcg, Oral, Daily (standard)    magnesium oxide-Mg AA chelate (MAGNESIUM, AMINO ACID CHELATE,) 133 mg 1 tablet, Oral, 2 times a day, Hold until directed to take by nurse coordinator    mycophenolate (CELLCEPT) 500 mg, Oral, 2 times a day (standard)    nystatin (MYCOSTATIN) 500,000 Units, Oral, 4 times a day    pantoprazole (PROTONIX) 40 mg, Oral, Daily (standard)    pen needle, diabetic 32 gauge x 5/32 (4 mm) Ndle Use with insulin up to 4 times/day as needed.    pen needle, diabetic 32 gauge x 5/32 Ndle Use with insulin 2 times daily    polyethylene glycol (GLYCOLAX) 17 g, Oral, Daily PRN    predniSONE (DELTASONE) 5 mg, Oral, Daily    SENNA 8.6 mg tablet 1 tablet, Oral, Daily PRN    sulfamethoxazole-trimethoprim (BACTRIM) 400-80 mg per tablet 80 mg of trimethoprim, Oral, Every Mon-Wed-Fri    tacrolimus (PROGRAF) 3 mg, Oral, 2 times a day    valGANciclovir (VALCYTE) 450 mg, Oral, Every other day       GRAFT FUNCTION: improving  Lab Results   Component Value Date    Creatinine 2.39 (H) 09/06/2022    Creatinine 2.04 (H) 09/04/2022    Creatinine 1.78 (H) 09/01/2022    Creatinine 2.20 (H) 08/31/2022    Creatinine 2.04 (H) 08/28/2022    Creatinine 2.36 (H) 08/27/2022    Creatinine 2.72 (H) 08/26/2022     Baseline Scr: TBD  Scr nadir: TBD  Estimated Creatinine Clearance: 17.4 mL/min (A) (based on SCr of 2.39 mg/dL (H)).    Proteinuria/UPC: Yes: improving.  Lab Results   Component Value Date    Protein/Creatinine Ratio, Urine 0.321 09/06/2022    Protein/Creatinine Ratio, Urine 6.635 08/11/2022       DSA: ntd  No results found for: DSAPT, DSACM, DSA1C    Zero hour biopsy: n/a  Biopsies to date: NTD      CURRENT IMMUNOSUPPRESSION:    Tacrolimus (Prograf) 3 mg BID  Tacrolimus Goal: 8 - 10   Mycophenolate mofetil (Cellcept) 500 mg BID (lowered with infection)  Prednisone 5 mg daily    IMMUNOSUPPRESSION DRUG LEVELS:  Lab Results   Component  Value Date    Tacrolimus, Trough 7.5 08/17/2022    Tacrolimus, Trough 6.4 08/16/2022    Tacrolimus, Trough 6.2 08/15/2022    Tacrolimus Lvl 12.7 09/04/2022    Tacrolimus Lvl 11.0 09/01/2022    Tacrolimus Lvl 10.0 08/31/2022     No level today.    Patient is tolerating immunosuppression well    WBC/ANC:  wnl  Lab Results   Component Value Date    WBC 4.1 09/06/2022    WBC 4.8 09/04/2022     Plan: Will maintain current immunosuppression. Continue to monitor.      OI Prophylaxis:   CMV Status: D+/ R+, moderate risk .   CMV prophylaxis: valganciclovir 450 mg two days per week x 3 months per protocol.  No results found for: CMVCP    PCP Prophylaxis: bactrim SS 1 tab MWF x 6 months.    Thrush: completed in hospital. Patient as oral thrush.    Patient is  on renal dosing    Plan:  Increase Valcyte to 450 mg every other day. Start Nystatin 5 mL QID  . Continue to monitor.    E Coli bacteremia  Levofloxacin, EOT 2/26    CV Prophylaxis: asa 81 mg   The 10-year ASCVD risk score (Arnett DK, et al., 2019) is: 44.7%  No results found for: LIPID    Statin therapy: Indicated; currently on atorvastatin 10 mg daily  Plan:  No change . Continue to monitor       BP: Goal < 140/90. Clinic vitals reported above  Home BP ranges: 160 - 180/40 - 50  Current meds include:   Amlodipine 10 mg daily  Carvedilol 25 mg BID  Chlorthalidone 25 mg daily  Clonidine 0.1 mg TID  Hydralazine 100 mg TID  Plan: out of goal, but trending down. DBP low. No change. Continue to monitor    Anemia of CKD:  H/H:   Lab Results   Component Value Date    HGB 10.0 (L) 09/06/2022     Lab HGB 10.0 (L) 09/06/2022     Lab Results   Component Value Date    HCT 29.6 (L) 09/06/2022     Iron panel:  Lab Results   Component Value Date    IRON 138 08/08/2022    TIBC 189 (L) 08/08/2022    FERRITIN 1,017.0 (H) 08/08/2022     Lab Results   Component Value Date    Iron Saturation (%) 73 (H) 08/08/2022     Prior ESA use: Epo given 1/25    Plan: out of goal. Continue to monitor.     DM:   Lab Results   Component Value Date    A1C 5.6 08/06/2022   . Goal A1c < 7  History of Dm? Yes:    Established with endocrinologist/PCP for BG managment?     Currently on:   Novolog 3 units TID AC + SSI  Lantus 7 units at bedtime (increased today)    Home BS log:  FBG 160s  Pre-Prandial: 180 - 200    Diet:Snacks  Exercise:minimal  Hypoglycemia: no  Plan: No change.        Electrolytes: wnl  Lab Results   Component Value Date    Potassium 4.5 09/06/2022    Potassium 4.4 09/04/2022    Potassium, Bld 3.8 08/21/2022    Sodium 138 09/06/2022    Sodium 140 09/04/2022    Sodium Whole Blood 132 (L) 08/21/2022    Magnesium 1.7 09/06/2022  Magnesium 1.6 09/04/2022    CO2 26.1 09/06/2022    CO2 20 09/04/2022    Phosphorus 3.5 09/06/2022     Meds currently on: None  Plan: No change.  Continue to monitor     GI/BM: pt reports 1 BM  Meds currently on: docusate PRN, Miralax PRN  Plan: No change.  Continue to monitor    Pain: pt reports no pain  Meds currently on: APAP PRN  Plan: No change.  Continue to monitor    Bone health:   Vitamin D Level: none available. Goal > 30.   No results found for: VITDTOT, VITDTOTAL    Lab Results   Component Value Date    Calcium 10.0 09/06/2022    Calcium 9.5 09/04/2022    Calcium 9.6 09/01/2022     Last DEXA results:  none available  Current meds include: Tums 1000 mg TID.   Plan: Vitamin D level  needs to be drawn with next lab schedule,continue supplementation. Continue to monitor.     Women's/Men's Health:  Wandalene Suffern is a 72 y.o. Female perimenopausal. Patient reports no men's/women's health Continue to monitor.     Women's/Men's Health:  Makinley Mccalman is a 72 y.o. Female perimenopausal. Patient reports no men's/women's health issues  Plan: Continue to monitor    Immunizations:  Influenza [Annual]: Received 04/2022    PCV13: Received 2020  PPSV23: Received 2016. 2023  PCV20:     Shingrix Zoster [2 doses, 2 - 6 months apart]: Never received    COVID-19 [3 primary doses, 2 boosters]: Received      Pharmacy preference:  [facility]  Mail order - [facility]?  Medication Refills:    Medication Access:      Adherence: Patient has poor understanding of medications; was not able to independently identify names/doses of immunosuppressants and OI meds.  Patient  does not fill their own pill box on a regular basis at home. Daughter fills.  Patient brought medication card:yes  Pill box:was correct  Plan: Daughters will help with AM and will Facetime for PM meds; provided extensive adherence counseling/intervention    Patient was reviewed with  Phillips Grout, NP  who was agreement with the stated plan:     During this visit, the following was completed:   BG log data assessment  BP log data assessment  Labs ordered and evaluated  complex treatment plan >1 DS   I spent a total of 30 minutes face to face with the patient delivering clinical care and providing education/counseling.    All questions/concerns were addressed to the patient's satisfaction.  __________________________________________  Olivia Mackie, CPP, PharmD  Solid Organ Transplant Clinical Pharmacist Practitioner

## 2022-09-07 ENCOUNTER — Ambulatory Visit: Admit: 2022-09-07 | Discharge: 2022-09-08 | Payer: MEDICARE

## 2022-09-07 DIAGNOSIS — Z940 Kidney transplant status: Secondary | ICD-10-CM | POA: Diagnosis not present

## 2022-09-07 DIAGNOSIS — T8389XA Other specified complication of genitourinary prosthetic devices, implants and grafts, initial encounter: Secondary | ICD-10-CM | POA: Diagnosis not present

## 2022-09-07 DIAGNOSIS — N186 End stage renal disease: Secondary | ICD-10-CM | POA: Diagnosis not present

## 2022-09-07 DIAGNOSIS — T8619 Other complication of kidney transplant: Secondary | ICD-10-CM | POA: Diagnosis not present

## 2022-09-07 LAB — TACROLIMUS LEVEL: TACROLIMUS BLOOD: 11.9 ng/mL

## 2022-09-07 LAB — TRANSPLANT HEPATITIS C RNA, QUANTITATIVE, PCR: HCV RNA: NOT DETECTED

## 2022-09-07 MED ADMIN — sodium chloride irrigation (NS) 0.9 % irrigation solution 500 mL: 500 mL | @ 16:00:00 | Stop: 2022-09-07

## 2022-09-07 MED ADMIN — lidocaine 2% gel (XYLOCAINE) jelly urojet 20 mL: 20 mL | URETHRAL | @ 16:00:00 | Stop: 2022-09-07

## 2022-09-07 NOTE — Unmapped (Signed)
Pt is present for a Cystoscopy Left Ureteral stent removal procedure   POCT UA was not obtained   Patient was prepped using a sterile technique.  Patient tolerated the procedure well.  AVS was given and post care was explained to patient.    The following instruments was used for this procedure.    AMBU single use   Lot # [id number]  Exp:05/19/2025      Check-Flo  [id number]  Exp10/26/2026      Single Use Grasper    Lot#[id number]

## 2022-09-07 NOTE — Unmapped (Signed)
Procedures  71yF s/p renal transplant    Cystoscopy, ureteral stent removal  Timeout was performed immediately prior to the procedure.    The patient was prepped and draped in the usual sterile fashion.  Flexible cystoscopy was performed.  The indwelling transplant ureteral stent was visualized, grasped, and removed intact.  The patient tolerated the procedure well and was not given one dose of antibiotic prophylaxis afterwards.    Plan: return prn

## 2022-09-07 NOTE — Unmapped (Signed)
[facility]:  Taking Care of Yourself After Cystoscopy w/ stent removal Procedures    *Drink plenty of water for a day or two following your procedure.  Try to have about 8 ounces (one cup) at a time, and do this 6 times or more per day.  (If you have fluid restrictions, please ask the nurse or doctor for advice).    *AVOID alcoholic, carbonated and caffeinated drinks for a day or two, as they may cause uncomfortable symptoms.    *For the first 8 hours after the procedure, your urine may be pink or red in color.  Small clots or a few drops of blood can be a normal side effect of the instruments.  Large amounts of bleeding or difficulty urinating are not normal.  Call your doctor if this happens.    *You may experience some mild discomfort of a burning sensation with urination after having this procedure.  If it does not improve, or if other symptoms appear (fever, chills, or difficulty emptying), call your doctor.    *You may return to normal daily activities such as work, school, driving, exercising and housework.    *If your doctor gave you a prescription, take it as ordered.    *If you need a return appointment, the secretary will make it for you when you check out. To contact the Urology Clinic during business hours, call [phone].    [facility] Operator can be reached at [phone] if you need to get in contact with your doctor.  After the hours, the operator can page the doctor on call for urgent concerns:    Urology Patients should ask for the Urology resident ???on call???.    You can get more immediate assistance at the Emergency Room or Urgent Care if necessary.

## 2022-09-08 DIAGNOSIS — Z940 Kidney transplant status: Secondary | ICD-10-CM | POA: Diagnosis not present

## 2022-09-08 LAB — RENAL FUNCTION PANEL
ALBUMIN: 3.8 g/dL (ref 3.8–4.8)
BLOOD UREA NITROGEN: 48 mg/dL — ABNORMAL HIGH (ref 8–27)
BUN / CREAT RATIO: 21 (ref 12–28)
CALCIUM: 10 mg/dL (ref 8.7–10.3)
CHLORIDE: 100 mmol/L (ref 96–106)
CO2: 20 mmol/L (ref 20–29)
CREATININE: 2.33 mg/dL — ABNORMAL HIGH (ref 0.57–1.00)
EGFR: 22 mL/min/{1.73_m2} — ABNORMAL LOW
GLUCOSE: 223 mg/dL — ABNORMAL HIGH (ref 70–99)
PHOSPHORUS, SERUM: 3.8 mg/dL (ref 3.0–4.3)
POTASSIUM: 4.7 mmol/L (ref 3.5–5.2)
SODIUM: 137 mmol/L (ref 134–144)

## 2022-09-08 LAB — CBC W/ DIFFERENTIAL
BANDED NEUTROPHILS ABSOLUTE COUNT: 0 10*3/uL (ref 0.0–0.1)
BASOPHILS ABSOLUTE COUNT: 0 10*3/uL (ref 0.0–0.2)
BASOPHILS RELATIVE PERCENT: 0 %
EOSINOPHILS ABSOLUTE COUNT: 0 10*3/uL (ref 0.0–0.4)
EOSINOPHILS RELATIVE PERCENT: 1 %
HEMATOCRIT: 29.8 % — ABNORMAL LOW (ref 34.0–46.6)
HEMOGLOBIN: 10 g/dL — ABNORMAL LOW (ref 11.1–15.9)
IMMATURE GRANULOCYTES: 1 %
LYMPHOCYTES ABSOLUTE COUNT: 0.9 10*3/uL (ref 0.7–3.1)
LYMPHOCYTES RELATIVE PERCENT: 22 %
MEAN CORPUSCULAR HEMOGLOBIN CONC: 33.6 g/dL (ref 31.5–35.7)
MEAN CORPUSCULAR HEMOGLOBIN: 31.7 pg (ref 26.6–33.0)
MEAN CORPUSCULAR VOLUME: 95 fL (ref 79–97)
MONOCYTES ABSOLUTE COUNT: 0.2 10*3/uL (ref 0.1–0.9)
MONOCYTES RELATIVE PERCENT: 6 %
NEUTROPHILS ABSOLUTE COUNT: 3 10*3/uL (ref 1.4–7.0)
NEUTROPHILS RELATIVE PERCENT: 70 %
PLATELET COUNT: 130 10*3/uL — ABNORMAL LOW (ref 150–450)
RED BLOOD CELL COUNT: 3.15 x10E6/uL — ABNORMAL LOW (ref 3.77–5.28)
RED CELL DISTRIBUTION WIDTH: 17.2 % — ABNORMAL HIGH (ref 11.7–15.4)
WHITE BLOOD CELL COUNT: 4.2 10*3/uL (ref 3.4–10.8)

## 2022-09-08 LAB — MAGNESIUM: MAGNESIUM: 1.6 mg/dL (ref 1.6–2.3)

## 2022-09-08 NOTE — Unmapped (Signed)
Called, sent text and MyChart message to confirm 3.7 @ 1:00pm follow-up with Dr. Elvera Maria

## 2022-09-10 LAB — TACROLIMUS LEVEL: TACROLIMUS BLOOD: 11.3 ng/mL (ref 2.0–20.0)

## 2022-09-11 DIAGNOSIS — Z940 Kidney transplant status: Secondary | ICD-10-CM | POA: Diagnosis not present

## 2022-09-11 LAB — CBC W/ DIFFERENTIAL
BANDED NEUTROPHILS ABSOLUTE COUNT: 0 10*3/uL (ref 0.0–0.1)
BASOPHILS ABSOLUTE COUNT: 0 10*3/uL (ref 0.0–0.2)
BASOPHILS RELATIVE PERCENT: 1 %
EOSINOPHILS ABSOLUTE COUNT: 0 10*3/uL (ref 0.0–0.4)
EOSINOPHILS RELATIVE PERCENT: 0 %
HEMATOCRIT: 33.5 % — ABNORMAL LOW (ref 34.0–46.6)
HEMOGLOBIN: 11 g/dL — ABNORMAL LOW (ref 11.1–15.9)
IMMATURE GRANULOCYTES: 1 %
LYMPHOCYTES ABSOLUTE COUNT: 1.1 10*3/uL (ref 0.7–3.1)
LYMPHOCYTES RELATIVE PERCENT: 22 %
MEAN CORPUSCULAR HEMOGLOBIN CONC: 32.8 g/dL (ref 31.5–35.7)
MEAN CORPUSCULAR HEMOGLOBIN: 31.2 pg (ref 26.6–33.0)
MEAN CORPUSCULAR VOLUME: 95 fL (ref 79–97)
MONOCYTES ABSOLUTE COUNT: 0.3 10*3/uL (ref 0.1–0.9)
MONOCYTES RELATIVE PERCENT: 5 %
NEUTROPHILS ABSOLUTE COUNT: 3.7 10*3/uL (ref 1.4–7.0)
NEUTROPHILS RELATIVE PERCENT: 71 %
PLATELET COUNT: 134 10*3/uL — ABNORMAL LOW (ref 150–450)
RED BLOOD CELL COUNT: 3.53 x10E6/uL — ABNORMAL LOW (ref 3.77–5.28)
RED CELL DISTRIBUTION WIDTH: 16.8 % — ABNORMAL HIGH (ref 11.7–15.4)
WHITE BLOOD CELL COUNT: 5.2 10*3/uL (ref 3.4–10.8)

## 2022-09-11 LAB — RENAL FUNCTION PANEL
ALBUMIN: 4.1 g/dL (ref 3.8–4.8)
BLOOD UREA NITROGEN: 47 mg/dL — ABNORMAL HIGH (ref 8–27)
BUN / CREAT RATIO: 18 (ref 12–28)
CALCIUM: 9.8 mg/dL (ref 8.7–10.3)
CHLORIDE: 103 mmol/L (ref 96–106)
CO2: 21 mmol/L (ref 20–29)
CREATININE: 2.55 mg/dL — ABNORMAL HIGH (ref 0.57–1.00)
EGFR: 19 mL/min/{1.73_m2} — ABNORMAL LOW
GLUCOSE: 140 mg/dL — ABNORMAL HIGH (ref 70–99)
PHOSPHORUS, SERUM: 3.1 mg/dL (ref 3.0–4.3)
POTASSIUM: 4.8 mmol/L (ref 3.5–5.2)
SODIUM: 140 mmol/L (ref 134–144)

## 2022-09-11 LAB — MAGNESIUM: MAGNESIUM: 1.9 mg/dL (ref 1.6–2.3)

## 2022-09-12 DIAGNOSIS — Z940 Kidney transplant status: Principal | ICD-10-CM

## 2022-09-12 LAB — HLA DS POST TRANSPLANT
ANTI-DONOR DRW #1 MFI: 29 MFI
ANTI-DONOR DRW #2 MFI: 29 MFI
ANTI-DONOR HLA-A #1 MFI: 7 MFI
ANTI-DONOR HLA-A #2 MFI: 5 MFI
ANTI-DONOR HLA-B #1 MFI: 15 MFI
ANTI-DONOR HLA-B #2 MFI: 0 MFI
ANTI-DONOR HLA-C #1 MFI: 0 MFI
ANTI-DONOR HLA-DP #2 MFI: 0 MFI
ANTI-DONOR HLA-DQB #1 MFI: 0 MFI
ANTI-DONOR HLA-DQB #2 MFI: 0 MFI
ANTI-DONOR HLA-DR #1 MFI: 9 MFI
ANTI-DONOR HLA-DR #2 MFI: 9 MFI

## 2022-09-12 LAB — FSAB CLASS 1 ANTIBODY SPECIFICITY: HLA CLASS 1 ANTIBODY RESULT: NEGATIVE

## 2022-09-12 LAB — FSAB CLASS 2 ANTIBODY SPECIFICITY: HLA CL2 AB RESULT: NEGATIVE

## 2022-09-12 NOTE — Unmapped (Addendum)
[facility] Clinical Assessment & Refill Coordination Note    Julie [name], [place]: 1950-10-11  Phone: [phone] (home)     All above HIPAA information was verified with patient's family member, daughter.     Was a Nurse, learning disability used for this call? No    Specialty Medication(s):   Transplant: mycophenolate mofetil 250mg , tacrolimus 1mg , valgancyclovir 450mg , and Prednisone 5mg      [facility] Clinical Intervention    Type of intervention: Narrow therapeutic index drug    Medication involved: tacrolimus    Problem identified: patient last filled at outside pharmacy, ssc only access to accord mfg, clinic ok with switch    Intervention performed: patient's daughter aware of and ok with switch, knows to contact clinic on date of switch as bloodwork may be needed    Follow-up needed: clinic aware, ssc will keep pt on consistent mfg going forward    Approximate time spent: 5-10 minutes    Clinical evidence used to support intervention: FDA Orange Book    Result of the intervention: Prevention of an adverse drug event    Thad Ranger, PharmD   [facility] Specialty Pharmacist    [facility] Clinical Intervention    Type of intervention: Narrow therapeutic index drug    Medication involved: levothyroxine    Problem identified: patient last filled at outside pharmacy, unsure of mfg, clinic ok with potential switch    Intervention performed: patient's daughter aware if mfg ever changes to let clinic know on date of switch as bloodwork may be needed    Follow-up needed: ssc not filling yet however will keep pt on consistent mfg going forward    Approximate time spent: 5-10 minutes    Clinical evidence used to support intervention: FDA Orange Book    Result of the intervention: Prevention of an adverse drug event    Thad Ranger, PharmD   [facility] Specialty Pharmacist        Current Outpatient Medications   Medication Sig Dispense Refill    acetaminophen (TYLENOL) 500 MG tablet Take 1-2 tablets (500-1,000 mg total) by mouth every eight (8) hours as needed for pain or fever. 60 tablet 11    amlodipine (NORVASC) 10 MG tablet Take 1 tablet (10 mg total) by mouth daily. 90 tablet 3    aspirin (ECOTRIN) 81 MG tablet Take 1 tablet (81 mg total) by mouth daily. 90 tablet 3    atorvastatin (LIPITOR) 10 MG tablet Take 1 tablet (10 mg total) by mouth daily. 90 tablet 3    blood sugar diagnostic (ACCU-CHEK AVIVA PLUS TEST STRP) Strp USE 1 STRIP TO CHECK GLUCOSE THREE TIMES DAILY      blood sugar diagnostic (GLUCOSE BLOOD) Strp Use to check blood sugar as directed with insulin 3 times a day & for symptoms of high or low blood sugar. 100 strip 11    blood sugar diagnostic Strp USE 1 STRIP TO CHECK GLUCOSE THREE TIMES DAILY      blood-glucose meter kit Use as directed. ACCU-CHEK AVIVA      blood-glucose meter kit Use as instructed 1 each 0    blood-glucose sensor (DEXCOM G7 SENSOR) Devi Use 1 sensor every 10 days. 3 each 11    calcium carbonate (TUMS) 200 mg calcium (500 mg) chewable tablet Chew 2 tablets (400 mg of elem calcium total) Three (3) times a day. 540 tablet  3    carvedilol (COREG) 25 MG tablet Take 1 tablet (25 mg total) by mouth two (2) times a day. 60 tablet 11    chlorthalidone (HYGROTON) 25 MG tablet Take 1 tablet (25 mg total) by mouth daily. 30 tablet 11    cloNIDine (CATAPRES-TTS) 0.3 mg/24 hr Place 1 patch on the skin once a week. 4 patch 0    [START ON 10/04/2022] cloNIDine (CATAPRES-TTS) 0.3 mg/24 hr Place 1 patch on the skin once a week. 4 patch 11    docusate sodium (COLACE) 100 MG capsule Take 1 capsule (100 mg total) by mouth two (2) times a day as needed for constipation. 60 capsule 11    hydrALAZINE (APRESOLINE) 100 MG tablet Take 1 tablet (100 mg total) by mouth every eight (8) hours. 270 tablet 3    insulin aspart (NOVOLOG FLEXPEN U-100 INSULIN) 100 unit/mL (3 mL) injection pen Inject 0.03 mL (3 Units total) under the skin Three (3) times a day before meals AND 0-0.08 mL (0-8 Units total) Three (3) times a day before meals. Max 33 units per day. 15 mL 7    insulin glargine (BASAGLAR, LANTUS) 100 unit/mL (3 mL) injection pen Inject 0.05-0.1 mL (5-10 Units total) under the skin nightly. 15 mL 3    lancets Misc Use to check blood sugar as directed with insulin 3 times a day & for symptoms of high or low blood sugar. 100 each 0    levoFLOXacin (LEVAQUIN) 500 MG tablet Take 1 tablet (500 mg total) by mouth every other day for 10 doses. 10 tablet 0    levothyroxine (SYNTHROID) 112 MCG tablet Take 1 tablet (112 mcg total) by mouth daily. 90 tablet 3    magnesium oxide-Mg AA chelate (MAGNESIUM, AMINO ACID CHELATE,) 133 mg Take 1 tablet by mouth two (2) times a day. Hold until directed to take by nurse coordinator 60 tablet 11    mycophenolate (CELLCEPT) 250 mg capsule Take 2 capsules (500 mg total) by mouth two (2) times a day. 360 capsule 3    nystatin (MYCOSTATIN) 100,000 unit/mL suspension Take 5 mL (500,000 Units total) by mouth four (4) times a day for 14 days. 280 mL 0    pantoprazole (PROTONIX) 40 MG tablet Take 1 tablet (40 mg total) by mouth daily. 90 tablet 3    pen needle, diabetic 32 gauge x 5/32 (4 mm) Ndle Use with insulin up to 4 times/day as needed. 100 each 11    pen needle, diabetic 32 gauge x 5/32 Ndle Use with insulin 2 times daily      polyethylene glycol (GLYCOLAX) 17 gram/dose powder Take 17 g by mouth daily as needed. 510 g 11    predniSONE (DELTASONE) 5 MG tablet Take 1 tablet (5 mg total) by mouth in the morning. 90 tablet 3    SENNA 8.6 mg tablet Take 1 tablet by mouth daily as needed for constipation. 30 tablet 11    sulfamethoxazole-trimethoprim (BACTRIM) 400-80 mg per tablet Take 1 tablet (80 mg of trimethoprim total) by mouth Every Monday, Wednesday, and Friday. 36 tablet 1    tacrolimus (PROGRAF) 1 MG capsule Take 2 capsules (2 mg total) by mouth two (2) times a day. 120 capsule 11    valGANciclovir (VALCYTE) 450 mg tablet Take 1 tablet (450 mg total) by mouth every other day. 45 tablet 0     No current facility-administered medications for this visit.        Changes to medications: Jamilet reports  no changes at this time.    Allergies   Allergen Reactions    Penicillins Other (See Comments)     patient denies PCN allergy and states she had formal allergy testing that ruled out PCN allergy. 09/27/18    Prior allergy comment states hypotension and rash with PCN, patient does not recall this    Oxycodone Nausea And Vomiting     Per Pt cannot tolerate oxycodone    Valacyclovir Nausea Only       Changes to allergies: No    SPECIALTY MEDICATION ADHERENCE     Tacrolimus 1mg   : 7 days of medicine on hand   Mycophenolate 250mg   : 6 days of medicine on hand   Valganciclovir 450mg  : 85 days of medicine on hand   Prednisone 5mg   : 7 days of medicine on hand       Medication Adherence    Patient reported X missed doses in the last month: 0  Specialty Medication: valganciclovir 450mg   Patient is on additional specialty medications: Yes  Additional Specialty Medications: Tacrolimus 1mg   Patient Reported Additional Medication X Missed Doses in the Last Month: 0  Patient is on more than two specialty medications: Yes  Specialty Medication: mycophenolate 250mg   Patient Reported Additional Medication X Missed Doses in the Last Month: 0  Specialty Medication: prednisone 5mg   Patient Reported Additional Medication X Missed Doses in the Last Month: 0          Specialty medication(s) dose(s) confirmed: Regimen is correct and unchanged.     Are there any concerns with adherence? No    Adherence counseling provided? Not needed    CLINICAL MANAGEMENT AND INTERVENTION      Clinical Benefit Assessment:    Do you feel the medicine is effective or helping your condition? Yes    Clinical Benefit counseling provided? Not needed    Adverse Effects Assessment:    Are you experiencing any side effects? No    Are you experiencing difficulty administering your medicine? No    Quality of Life Assessment:    Quality of Life    Rheumatology  Oncology  Dermatology  Cystic Fibrosis          How many days over the past month did your transplant  keep you from your normal activities? For example, brushing your teeth or getting up in the morning. 0    Have you discussed this with your provider? Not needed    Acute Infection Status:    Acute infections noted within Epic:  No active infections  Patient reported infection: None    Therapy Appropriateness:    Is therapy appropriate and patient progressing towards therapeutic goals? Yes, therapy is appropriate and should be continued    DISEASE/MEDICATION-SPECIFIC INFORMATION      N/A    Solid Organ Transplant: Not Applicable    PATIENT SPECIFIC NEEDS     Does the patient have any physical, cognitive, or cultural barriers? No    Is the patient high risk? Yes, patient is taking a REMS drug. Medication is dispensed in compliance with REMS program    Did the patient require a clinical intervention? No    Does the patient require physician intervention or other additional services (i.e., nutrition, smoking cessation, social work)? No    SOCIAL DETERMINANTS OF HEALTH     At the [facility], we have learned that life circumstances - like trouble affording food, housing, utilities, or transportation can affect the health of many  of our patients.   That is why we wanted to ask: are you currently experiencing any life circumstances that are negatively impacting your health and/or quality of life? No    Social Determinants of Health     Financial Resource Strain: Low Risk  (04/06/2018)    Received from [facility]    Overall Financial Resource Strain (CARDIA)     Difficulty of Paying Living Expenses: Not hard at all   Internet Connectivity: Not on file   Food Insecurity: No Food Insecurity (02/28/2022)    Received from [facility]    Hunger Vital Sign Worried About Running Out of Food in the Last Year: Never true     Ran Out of Food in the Last Year: Never true   Tobacco Use: Medium Risk (09/07/2022)    Patient History     Smoking Tobacco Use: Former     Smokeless Tobacco Use: Former     Passive Exposure: Not on file   Housing/Utilities: Not on file   Alcohol Use: Not on file   Transportation Needs: No Transportation Needs (02/28/2022)    Received from [facility]     Lack of Transportation (Medical): No     Lack of Transportation (Non-Medical): No   Substance Use: Not on file   Health Literacy: Not on file   Physical Activity: Not on file   Interpersonal Safety: Not on file   Stress: Not on file   Intimate Partner Violence: Not At Risk (04/06/2018)    Received from [facility]    Humiliation, Afraid, Rape, and Kick questionnaire     Fear of Current or Ex-Partner: No     Emotionally Abused: No     Physically Abused: No     Sexually Abused: No   Depression: Not at risk (03/07/2022)    Received from [facility]    PHQ-2     Total Score =: 0   Social Connections: Unknown (04/06/2018)    Received from [facility]    Social Connection and Isolation Panel [NHANES]     Frequency of Communication with Friends and Family: Not on file     Frequency of Social Gatherings with Friends and Family: Not on file     Attends Religious Services: Not on file     Active Member of Clubs or Organizations: Not on file     Attends Banker Meetings: Not on file     Marital Status: Married       Would you be willing to receive help with any of the needs that you have identified today? Not applicable       SHIPPING     Specialty Medication(s) to be Shipped:   Transplant: mycophenolate mofetil 250mg , tacrolimus 1mg , and Prednisone 5mg     Other medication(s) to be shipped:  coreg, bactrim, norvasc, lipitor  ALL other medications declined today due to either supply on hand, getting at [facility], or getting over the counter. Aware to contact us if 1 week or less of ANY medication to avoid delays     Changes to insurance: No    Patient was informed of new phone menu: Yes    Delivery Scheduled: Yes, Expected medication delivery date: 09/15/2022.     Medication will be delivered via Next Day Courier to the confirmed prescription address in [facility].    The patient will receive a drug information handout for each medication shipped and additional FDA Medication Guides  as required.  Verified that patient has previously received a Conservation officer, historic buildings and a Surveyor, mining.    The patient or caregiver noted above participated in the development of this care plan and knows that they can request review of or adjustments to the care plan at any time.      All of the patient's questions and concerns have been addressed.    Thad Ranger, PharmD   [facility] Specialty Pharmacist

## 2022-09-12 NOTE — Unmapped (Addendum)
This medication has already been onboarded and first clinical complted (see epic notes from 2/12 and 2/20).  No dose change since onboarding/clinical completed.  It is set up to fill already, rx in existing work order-ef      [facility] Onboarding    Specialty Medication: Mycophenolate 250mg  capsule  Prior Authorization: Not Required   Financial Assistance: No - copay  <$25  Final Copay/Day Supply: $28.86  / 90 days    Insurance Restrictions: None     Notes to Pharmacist: N/A    The triage team has completed the benefits investigation and has determined that the patient is able to fill this medication at [facility]. Please contact the patient to complete the onboarding or follow up with the prescribing physician as needed.

## 2022-09-14 DIAGNOSIS — I351 Nonrheumatic aortic (valve) insufficiency: Secondary | ICD-10-CM | POA: Diagnosis not present

## 2022-09-14 DIAGNOSIS — I700 Atherosclerosis of aorta: Secondary | ICD-10-CM | POA: Diagnosis not present

## 2022-09-14 DIAGNOSIS — I1310 Hypertensive heart and chronic kidney disease without heart failure, with stage 1 through stage 4 chronic kidney disease, or unspecified chronic kidney disease: Secondary | ICD-10-CM | POA: Diagnosis not present

## 2022-09-14 DIAGNOSIS — E1122 Type 2 diabetes mellitus with diabetic chronic kidney disease: Secondary | ICD-10-CM | POA: Diagnosis not present

## 2022-09-14 DIAGNOSIS — Z4822 Encounter for aftercare following kidney transplant: Secondary | ICD-10-CM | POA: Diagnosis not present

## 2022-09-14 DIAGNOSIS — E039 Hypothyroidism, unspecified: Secondary | ICD-10-CM | POA: Diagnosis not present

## 2022-09-14 DIAGNOSIS — D631 Anemia in chronic kidney disease: Secondary | ICD-10-CM | POA: Diagnosis not present

## 2022-09-14 DIAGNOSIS — Z940 Kidney transplant status: Secondary | ICD-10-CM | POA: Diagnosis not present

## 2022-09-14 DIAGNOSIS — N189 Chronic kidney disease, unspecified: Secondary | ICD-10-CM | POA: Diagnosis not present

## 2022-09-14 DIAGNOSIS — I2510 Atherosclerotic heart disease of native coronary artery without angina pectoris: Secondary | ICD-10-CM | POA: Diagnosis not present

## 2022-09-14 LAB — RENAL FUNCTION PANEL
ALBUMIN: 3.9 g/dL (ref 3.8–4.8)
BLOOD UREA NITROGEN: 68 mg/dL — ABNORMAL HIGH (ref 8–27)
BUN / CREAT RATIO: 29 — ABNORMAL HIGH (ref 12–28)
CALCIUM: 8.9 mg/dL (ref 8.7–10.3)
CHLORIDE: 103 mmol/L (ref 96–106)
CO2: 19 mmol/L — ABNORMAL LOW (ref 20–29)
CREATININE: 2.34 mg/dL — ABNORMAL HIGH (ref 0.57–1.00)
EGFR: 21 mL/min/{1.73_m2} — ABNORMAL LOW
GLUCOSE: 254 mg/dL — ABNORMAL HIGH (ref 70–99)
PHOSPHORUS, SERUM: 3 mg/dL (ref 3.0–4.3)
POTASSIUM: 5.1 mmol/L (ref 3.5–5.2)
SODIUM: 137 mmol/L (ref 134–144)

## 2022-09-14 LAB — CBC W/ DIFFERENTIAL
BANDED NEUTROPHILS ABSOLUTE COUNT: 0 10*3/uL (ref 0.0–0.1)
BASOPHILS ABSOLUTE COUNT: 0 10*3/uL (ref 0.0–0.2)
BASOPHILS RELATIVE PERCENT: 0 %
EOSINOPHILS ABSOLUTE COUNT: 0 10*3/uL (ref 0.0–0.4)
EOSINOPHILS RELATIVE PERCENT: 0 %
HEMATOCRIT: 30.9 % — ABNORMAL LOW (ref 34.0–46.6)
HEMOGLOBIN: 10.3 g/dL — ABNORMAL LOW (ref 11.1–15.9)
IMMATURE GRANULOCYTES: 1 %
LYMPHOCYTES ABSOLUTE COUNT: 0.9 10*3/uL (ref 0.7–3.1)
LYMPHOCYTES RELATIVE PERCENT: 20 %
MEAN CORPUSCULAR HEMOGLOBIN CONC: 33.3 g/dL (ref 31.5–35.7)
MEAN CORPUSCULAR HEMOGLOBIN: 31.6 pg (ref 26.6–33.0)
MEAN CORPUSCULAR VOLUME: 95 fL (ref 79–97)
MONOCYTES ABSOLUTE COUNT: 0.2 10*3/uL (ref 0.1–0.9)
MONOCYTES RELATIVE PERCENT: 5 %
NEUTROPHILS ABSOLUTE COUNT: 3.4 10*3/uL (ref 1.4–7.0)
NEUTROPHILS RELATIVE PERCENT: 74 %
PLATELET COUNT: 103 10*3/uL — ABNORMAL LOW (ref 150–450)
RED BLOOD CELL COUNT: 3.26 x10E6/uL — ABNORMAL LOW (ref 3.77–5.28)
RED CELL DISTRIBUTION WIDTH: 16.5 % — ABNORMAL HIGH (ref 11.7–15.4)
WHITE BLOOD CELL COUNT: 4.7 10*3/uL (ref 3.4–10.8)

## 2022-09-14 LAB — TACROLIMUS LEVEL: TACROLIMUS BLOOD: 9.6 ng/mL (ref 2.0–20.0)

## 2022-09-14 LAB — MAGNESIUM: MAGNESIUM: 2 mg/dL (ref 1.6–2.3)

## 2022-09-14 LAB — HEPATITIS B DNA, QUANTITATIVE, PCR: HBV DNA QUANT: NOT DETECTED

## 2022-09-14 NOTE — Unmapped (Addendum)
Julie [name] 's entire shipment will be delayed as a result of insurance processor down.     I have reached out to the patient  at [phone] and left a voicemail message.  We will reschedule the medication for the delivery date that the patient agreed upon.  We have confirmed the delivery date as 02/26 or 02/27, via same day courier.

## 2022-09-15 NOTE — Unmapped (Signed)
Julie [name]'s husband called to add BD Nano pen needles to order

## 2022-09-18 DIAGNOSIS — Z940 Kidney transplant status: Secondary | ICD-10-CM | POA: Diagnosis not present

## 2022-09-18 DIAGNOSIS — I1310 Hypertensive heart and chronic kidney disease without heart failure, with stage 1 through stage 4 chronic kidney disease, or unspecified chronic kidney disease: Secondary | ICD-10-CM | POA: Diagnosis not present

## 2022-09-18 DIAGNOSIS — I351 Nonrheumatic aortic (valve) insufficiency: Secondary | ICD-10-CM | POA: Diagnosis not present

## 2022-09-18 DIAGNOSIS — E1122 Type 2 diabetes mellitus with diabetic chronic kidney disease: Secondary | ICD-10-CM | POA: Diagnosis not present

## 2022-09-18 DIAGNOSIS — E039 Hypothyroidism, unspecified: Secondary | ICD-10-CM | POA: Diagnosis not present

## 2022-09-18 DIAGNOSIS — I2510 Atherosclerotic heart disease of native coronary artery without angina pectoris: Secondary | ICD-10-CM | POA: Diagnosis not present

## 2022-09-18 DIAGNOSIS — N189 Chronic kidney disease, unspecified: Secondary | ICD-10-CM | POA: Diagnosis not present

## 2022-09-18 DIAGNOSIS — I700 Atherosclerosis of aorta: Secondary | ICD-10-CM | POA: Diagnosis not present

## 2022-09-18 DIAGNOSIS — Z4822 Encounter for aftercare following kidney transplant: Secondary | ICD-10-CM | POA: Diagnosis not present

## 2022-09-18 DIAGNOSIS — D631 Anemia in chronic kidney disease: Secondary | ICD-10-CM | POA: Diagnosis not present

## 2022-09-18 LAB — RENAL FUNCTION PANEL
ALBUMIN: 4.2 g/dL (ref 3.8–4.8)
BLOOD UREA NITROGEN: 55 mg/dL — ABNORMAL HIGH (ref 8–27)
BUN / CREAT RATIO: 27 (ref 12–28)
CALCIUM: 8.8 mg/dL (ref 8.7–10.3)
CHLORIDE: 106 mmol/L (ref 96–106)
CO2: 16 mmol/L — ABNORMAL LOW (ref 20–29)
CREATININE: 2.01 mg/dL — ABNORMAL HIGH (ref 0.57–1.00)
EGFR: 26 mL/min/{1.73_m2} — ABNORMAL LOW
GLUCOSE: 155 mg/dL — ABNORMAL HIGH (ref 70–99)
PHOSPHORUS, SERUM: 2.5 mg/dL — ABNORMAL LOW (ref 3.0–4.3)
POTASSIUM: 4.6 mmol/L (ref 3.5–5.2)
SODIUM: 140 mmol/L (ref 134–144)

## 2022-09-18 LAB — MAGNESIUM: MAGNESIUM: 2.3 mg/dL (ref 1.6–2.3)

## 2022-09-19 ENCOUNTER — Institutional Professional Consult (permissible substitution): Admit: 2022-09-19 | Discharge: 2022-09-20 | Payer: MEDICARE

## 2022-09-19 DIAGNOSIS — N189 Chronic kidney disease, unspecified: Secondary | ICD-10-CM | POA: Diagnosis not present

## 2022-09-19 DIAGNOSIS — Z4822 Encounter for aftercare following kidney transplant: Secondary | ICD-10-CM | POA: Diagnosis not present

## 2022-09-19 DIAGNOSIS — D631 Anemia in chronic kidney disease: Secondary | ICD-10-CM | POA: Diagnosis not present

## 2022-09-19 DIAGNOSIS — E1122 Type 2 diabetes mellitus with diabetic chronic kidney disease: Secondary | ICD-10-CM | POA: Diagnosis not present

## 2022-09-19 DIAGNOSIS — I351 Nonrheumatic aortic (valve) insufficiency: Secondary | ICD-10-CM | POA: Diagnosis not present

## 2022-09-19 DIAGNOSIS — I700 Atherosclerosis of aorta: Secondary | ICD-10-CM | POA: Diagnosis not present

## 2022-09-19 DIAGNOSIS — E039 Hypothyroidism, unspecified: Secondary | ICD-10-CM | POA: Diagnosis not present

## 2022-09-19 DIAGNOSIS — I2510 Atherosclerotic heart disease of native coronary artery without angina pectoris: Secondary | ICD-10-CM | POA: Diagnosis not present

## 2022-09-19 DIAGNOSIS — I1310 Hypertensive heart and chronic kidney disease without heart failure, with stage 1 through stage 4 chronic kidney disease, or unspecified chronic kidney disease: Secondary | ICD-10-CM | POA: Diagnosis not present

## 2022-09-19 DIAGNOSIS — Z940 Kidney transplant status: Principal | ICD-10-CM

## 2022-09-19 LAB — CBC W/ DIFFERENTIAL
BANDED NEUTROPHILS ABSOLUTE COUNT: 0 10*3/uL (ref 0.0–0.1)
BASOPHILS ABSOLUTE COUNT: 0 10*3/uL (ref 0.0–0.2)
BASOPHILS RELATIVE PERCENT: 0 %
EOSINOPHILS ABSOLUTE COUNT: 0 10*3/uL (ref 0.0–0.4)
EOSINOPHILS RELATIVE PERCENT: 0 %
HEMATOCRIT: 33.8 % — ABNORMAL LOW (ref 34.0–46.6)
HEMOGLOBIN: 11.2 g/dL (ref 11.1–15.9)
IMMATURE GRANULOCYTES: 0 %
LYMPHOCYTES ABSOLUTE COUNT: 1 10*3/uL (ref 0.7–3.1)
LYMPHOCYTES RELATIVE PERCENT: 21 %
MEAN CORPUSCULAR HEMOGLOBIN CONC: 33.1 g/dL (ref 31.5–35.7)
MEAN CORPUSCULAR HEMOGLOBIN: 32 pg (ref 26.6–33.0)
MEAN CORPUSCULAR VOLUME: 97 fL (ref 79–97)
MONOCYTES ABSOLUTE COUNT: 0.3 10*3/uL (ref 0.1–0.9)
MONOCYTES RELATIVE PERCENT: 5 %
NEUTROPHILS ABSOLUTE COUNT: 3.6 10*3/uL (ref 1.4–7.0)
NEUTROPHILS RELATIVE PERCENT: 74 %
PLATELET COUNT: 96 10*3/uL — CL (ref 150–450)
RED BLOOD CELL COUNT: 3.5 x10E6/uL — ABNORMAL LOW (ref 3.77–5.28)
RED CELL DISTRIBUTION WIDTH: 16.7 % — ABNORMAL HIGH (ref 11.7–15.4)
WHITE BLOOD CELL COUNT: 4.9 10*3/uL (ref 3.4–10.8)

## 2022-09-19 LAB — TACROLIMUS LEVEL: TACROLIMUS BLOOD: 8.7 ng/mL (ref 2.0–20.0)
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
## 2022-09-21 DIAGNOSIS — Z940 Kidney transplant status: Secondary | ICD-10-CM | POA: Diagnosis not present

## 2022-09-21 LAB — RENAL FUNCTION PANEL
ALBUMIN: 4.1 g/dL (ref 3.8–4.8)
BLOOD UREA NITROGEN: 63 mg/dL — ABNORMAL HIGH (ref 8–27)
BUN / CREAT RATIO: 32 — ABNORMAL HIGH (ref 12–28)
CALCIUM: 8.1 mg/dL — ABNORMAL LOW (ref 8.7–10.3)
CHLORIDE: 108 mmol/L — ABNORMAL HIGH (ref 96–106)
CO2: 18 mmol/L — ABNORMAL LOW (ref 20–29)
CREATININE: 2 mg/dL — ABNORMAL HIGH (ref 0.57–1.00)
EGFR: 26 mL/min/{1.73_m2} — ABNORMAL LOW
GLUCOSE: 214 mg/dL — ABNORMAL HIGH (ref 70–99)
PHOSPHORUS, SERUM: 2.3 mg/dL — ABNORMAL LOW (ref 3.0–4.3)
POTASSIUM: 5.1 mmol/L (ref 3.5–5.2)
SODIUM: 140 mmol/L (ref 134–144)

## 2022-09-21 LAB — MAGNESIUM: MAGNESIUM: 2.2 mg/dL (ref 1.6–2.3)

## 2022-09-21 LAB — TACROLIMUS LEVEL: TACROLIMUS BLOOD: 11.2 ng/mL (ref 2.0–20.0)

## 2022-09-21 MED ORDER — TACROLIMUS 1 MG CAPSULE, IMMEDIATE-RELEASE
ORAL_CAPSULE | ORAL | 3 refills | 90 days | Status: CP
Start: 2022-09-21 — End: 2023-09-21

## 2022-09-21 NOTE — Unmapped (Signed)
error 

## 2022-09-21 NOTE — Unmapped (Signed)
Reviewed recent labs and tacrolimus level 11.2 with Knox Saliva PharmD, pt advised to decrease tacrolimus dose to 2 mg in the morning and 1 mg in the evening.  Detailed VM left for pts daughter Morrie Sheldon and asked to return call to confirm receipt of dose change.

## 2022-09-22 DIAGNOSIS — Z79899 Other long term (current) drug therapy: Principal | ICD-10-CM

## 2022-09-22 DIAGNOSIS — I151 Hypertension secondary to other renal disorders: Principal | ICD-10-CM

## 2022-09-22 DIAGNOSIS — Z940 Kidney transplant status: Principal | ICD-10-CM

## 2022-09-22 LAB — CBC W/ DIFFERENTIAL
BANDED NEUTROPHILS ABSOLUTE COUNT: 0 10*3/uL (ref 0.0–0.1)
BASOPHILS ABSOLUTE COUNT: 0 10*3/uL (ref 0.0–0.2)
BASOPHILS RELATIVE PERCENT: 0 %
EOSINOPHILS ABSOLUTE COUNT: 0 10*3/uL (ref 0.0–0.4)
EOSINOPHILS RELATIVE PERCENT: 1 %
HEMATOCRIT: 32.8 % — ABNORMAL LOW (ref 34.0–46.6)
HEMOGLOBIN: 10.8 g/dL — ABNORMAL LOW (ref 11.1–15.9)
IMMATURE GRANULOCYTES: 0 %
LYMPHOCYTES ABSOLUTE COUNT: 1.2 10*3/uL (ref 0.7–3.1)
LYMPHOCYTES RELATIVE PERCENT: 20 %
MEAN CORPUSCULAR HEMOGLOBIN CONC: 32.9 g/dL (ref 31.5–35.7)
MEAN CORPUSCULAR HEMOGLOBIN: 31.9 pg (ref 26.6–33.0)
MEAN CORPUSCULAR VOLUME: 97 fL (ref 79–97)
MONOCYTES ABSOLUTE COUNT: 0.3 10*3/uL (ref 0.1–0.9)
MONOCYTES RELATIVE PERCENT: 4 %
NEUTROPHILS ABSOLUTE COUNT: 4.5 10*3/uL (ref 1.4–7.0)
NEUTROPHILS RELATIVE PERCENT: 75 %
PLATELET COUNT: 89 10*3/uL — CL (ref 150–450)
RED BLOOD CELL COUNT: 3.39 x10E6/uL — ABNORMAL LOW (ref 3.77–5.28)
RED CELL DISTRIBUTION WIDTH: 16.3 % — ABNORMAL HIGH (ref 11.7–15.4)
WHITE BLOOD CELL COUNT: 6.1 10*3/uL (ref 3.4–10.8)

## 2022-09-22 MED ORDER — PANTOPRAZOLE 40 MG TABLET,DELAYED RELEASE
ORAL_TABLET | Freq: Every day | ORAL | 3 refills | 90 days | Status: CP
Start: 2022-09-22 — End: 2023-09-22

## 2022-09-22 MED ORDER — CHLORTHALIDONE 25 MG TABLET
ORAL_TABLET | Freq: Every day | ORAL | 3 refills | 90 days | Status: CP
Start: 2022-09-22 — End: 2023-09-22

## 2022-09-22 MED ORDER — SULFAMETHOXAZOLE 400 MG-TRIMETHOPRIM 80 MG TABLET
ORAL_TABLET | ORAL | 1 refills | 84 days | Status: CP
Start: 2022-09-22 — End: 2023-03-21

## 2022-09-22 MED ORDER — INSULIN ASPART (U-100) 100 UNIT/ML (3 ML) SUBCUTANEOUS PEN
SUBCUTANEOUS | 3 refills | 90 days | Status: CP
Start: 2022-09-22 — End: 2023-09-22

## 2022-09-22 MED ORDER — ATORVASTATIN 10 MG TABLET
ORAL_TABLET | Freq: Every day | ORAL | 3 refills | 90 days | Status: CP
Start: 2022-09-22 — End: 2023-09-22

## 2022-09-22 MED ORDER — CARVEDILOL 25 MG TABLET
ORAL_TABLET | Freq: Two times a day (BID) | ORAL | 3 refills | 90 days | Status: CP
Start: 2022-09-22 — End: 2023-09-22

## 2022-09-22 MED ORDER — LEVOTHYROXINE 112 MCG TABLET
ORAL_TABLET | Freq: Every day | ORAL | 3 refills | 90 days | Status: CP
Start: 2022-09-22 — End: 2023-09-22

## 2022-09-22 MED ORDER — INSULIN GLARGINE (U-100) 100 UNIT/ML (3 ML) SUBCUTANEOUS PEN
Freq: Every evening | SUBCUTANEOUS | 3 refills | 90 days | Status: CP
Start: 2022-09-22 — End: 2023-09-22

## 2022-09-22 MED ORDER — ASPIRIN 81 MG TABLET,DELAYED RELEASE
ORAL_TABLET | Freq: Every day | ORAL | 3 refills | 90 days | Status: CP
Start: 2022-09-22 — End: 2023-09-22

## 2022-09-22 MED ORDER — AMLODIPINE 10 MG TABLET
ORAL_TABLET | Freq: Every day | ORAL | 3 refills | 90 days | Status: CP
Start: 2022-09-22 — End: 2023-09-22

## 2022-09-22 MED ORDER — PEN NEEDLE, DIABETIC 32 GAUGE X 5/32" (4 MM)
11 refills | 0 days | Status: CP
Start: 2022-09-22 — End: ?

## 2022-09-22 MED ORDER — DEXCOM G7 SENSOR DEVICE
11 refills | 30 days | Status: CP
Start: 2022-09-22 — End: ?

## 2022-09-22 MED ORDER — HYDRALAZINE 100 MG TABLET
ORAL_TABLET | Freq: Three times a day (TID) | ORAL | 3 refills | 90 days | Status: CP
Start: 2022-09-22 — End: 2023-09-22

## 2022-09-22 NOTE — Unmapped (Signed)
Called to check in with patient.  BPs running 160-170/40-58. She does not like the clonidine patch, prefers the pill. BG fasting running 170-200s, she takes 7 units of lantus at noon but often forgets to take meal time insulin and then BG 200-400s. No low BG reported.  Reviewed with Dr. Elvera Maria and Knox Saliva PharmD, pt advised to increase lantus to 10 units daily.    Pts daughter decided that they will keep the patch until she runs out in 3 weeks.

## 2022-09-23 LAB — TACROLIMUS LEVEL: TACROLIMUS BLOOD: 10.6 ng/mL (ref 2.0–20.0)

## 2022-09-23 NOTE — Unmapped (Addendum)
[facility] Pharmacist has reviewed a new prescription for tacrolimus that indicates a dose decrease.  Patient was counseled on this dosage change by coordinator CC- see epic note from 2/29 and 3/1.  Next refill call date adjusted if necessary.      Clinical Assessment Needed For: Dose Change  Medication: Tacrolimus 1mg  capsule  Last Fill Date/Day Supply: 09/17/2022 / 30 days  Copay $17.61 for 90ds    Was previous dose already scheduled to fill: No    Notes to Pharmacist: N/A

## 2022-09-25 DIAGNOSIS — I1310 Hypertensive heart and chronic kidney disease without heart failure, with stage 1 through stage 4 chronic kidney disease, or unspecified chronic kidney disease: Secondary | ICD-10-CM | POA: Diagnosis not present

## 2022-09-25 DIAGNOSIS — D631 Anemia in chronic kidney disease: Secondary | ICD-10-CM | POA: Diagnosis not present

## 2022-09-25 DIAGNOSIS — Z940 Kidney transplant status: Secondary | ICD-10-CM | POA: Diagnosis not present

## 2022-09-25 DIAGNOSIS — E039 Hypothyroidism, unspecified: Secondary | ICD-10-CM | POA: Diagnosis not present

## 2022-09-25 DIAGNOSIS — I351 Nonrheumatic aortic (valve) insufficiency: Secondary | ICD-10-CM | POA: Diagnosis not present

## 2022-09-25 DIAGNOSIS — N189 Chronic kidney disease, unspecified: Secondary | ICD-10-CM | POA: Diagnosis not present

## 2022-09-25 DIAGNOSIS — Z4822 Encounter for aftercare following kidney transplant: Secondary | ICD-10-CM | POA: Diagnosis not present

## 2022-09-25 DIAGNOSIS — I700 Atherosclerosis of aorta: Secondary | ICD-10-CM | POA: Diagnosis not present

## 2022-09-25 DIAGNOSIS — E1122 Type 2 diabetes mellitus with diabetic chronic kidney disease: Secondary | ICD-10-CM | POA: Diagnosis not present

## 2022-09-25 DIAGNOSIS — I2510 Atherosclerotic heart disease of native coronary artery without angina pectoris: Secondary | ICD-10-CM | POA: Diagnosis not present

## 2022-09-26 DIAGNOSIS — Z940 Kidney transplant status: Principal | ICD-10-CM

## 2022-09-26 LAB — RENAL FUNCTION PANEL
ALBUMIN: 4.2 g/dL (ref 3.8–4.8)
BLOOD UREA NITROGEN: 67 mg/dL — ABNORMAL HIGH (ref 8–27)
BUN / CREAT RATIO: 43 — ABNORMAL HIGH (ref 12–28)
CALCIUM: 9 mg/dL (ref 8.7–10.3)
CHLORIDE: 112 mmol/L — ABNORMAL HIGH (ref 96–106)
CO2: 15 mmol/L — ABNORMAL LOW (ref 20–29)
CREATININE: 1.56 mg/dL — ABNORMAL HIGH (ref 0.57–1.00)
EGFR: 35 mL/min/{1.73_m2} — ABNORMAL LOW
GLUCOSE: 168 mg/dL — ABNORMAL HIGH (ref 70–99)
PHOSPHORUS, SERUM: 2.4 mg/dL — ABNORMAL LOW (ref 3.0–4.3)
POTASSIUM: 4.8 mmol/L (ref 3.5–5.2)
SODIUM: 142 mmol/L (ref 134–144)

## 2022-09-26 LAB — CBC W/ DIFFERENTIAL
BANDED NEUTROPHILS ABSOLUTE COUNT: 0 10*3/uL (ref 0.0–0.1)
BASOPHILS ABSOLUTE COUNT: 0 10*3/uL (ref 0.0–0.2)
BASOPHILS RELATIVE PERCENT: 0 %
EOSINOPHILS ABSOLUTE COUNT: 0 10*3/uL (ref 0.0–0.4)
EOSINOPHILS RELATIVE PERCENT: 0 %
HEMATOCRIT: 29.6 % — ABNORMAL LOW (ref 34.0–46.6)
HEMOGLOBIN: 9.8 g/dL — ABNORMAL LOW (ref 11.1–15.9)
IMMATURE GRANULOCYTES: 0 %
LYMPHOCYTES ABSOLUTE COUNT: 1 10*3/uL (ref 0.7–3.1)
LYMPHOCYTES RELATIVE PERCENT: 14 %
MEAN CORPUSCULAR HEMOGLOBIN CONC: 33.1 g/dL (ref 31.5–35.7)
MEAN CORPUSCULAR HEMOGLOBIN: 31.8 pg (ref 26.6–33.0)
MEAN CORPUSCULAR VOLUME: 96 fL (ref 79–97)
MONOCYTES ABSOLUTE COUNT: 0.3 10*3/uL (ref 0.1–0.9)
MONOCYTES RELATIVE PERCENT: 4 %
NEUTROPHILS ABSOLUTE COUNT: 5.7 10*3/uL (ref 1.4–7.0)
NEUTROPHILS RELATIVE PERCENT: 82 %
PLATELET COUNT: 99 10*3/uL — CL (ref 150–450)
RED BLOOD CELL COUNT: 3.08 x10E6/uL — ABNORMAL LOW (ref 3.77–5.28)
RED CELL DISTRIBUTION WIDTH: 15.9 % — ABNORMAL HIGH (ref 11.7–15.4)
WHITE BLOOD CELL COUNT: 7.1 10*3/uL (ref 3.4–10.8)

## 2022-09-26 LAB — MAGNESIUM: MAGNESIUM: 1.9 mg/dL (ref 1.6–2.3)

## 2022-09-27 DIAGNOSIS — E1122 Type 2 diabetes mellitus with diabetic chronic kidney disease: Secondary | ICD-10-CM | POA: Diagnosis not present

## 2022-09-27 DIAGNOSIS — I1310 Hypertensive heart and chronic kidney disease without heart failure, with stage 1 through stage 4 chronic kidney disease, or unspecified chronic kidney disease: Secondary | ICD-10-CM | POA: Diagnosis not present

## 2022-09-27 DIAGNOSIS — E039 Hypothyroidism, unspecified: Secondary | ICD-10-CM | POA: Diagnosis not present

## 2022-09-27 DIAGNOSIS — N189 Chronic kidney disease, unspecified: Secondary | ICD-10-CM | POA: Diagnosis not present

## 2022-09-27 DIAGNOSIS — I700 Atherosclerosis of aorta: Secondary | ICD-10-CM | POA: Diagnosis not present

## 2022-09-27 DIAGNOSIS — D631 Anemia in chronic kidney disease: Secondary | ICD-10-CM | POA: Diagnosis not present

## 2022-09-27 DIAGNOSIS — Z4822 Encounter for aftercare following kidney transplant: Secondary | ICD-10-CM | POA: Diagnosis not present

## 2022-09-27 DIAGNOSIS — I351 Nonrheumatic aortic (valve) insufficiency: Secondary | ICD-10-CM | POA: Diagnosis not present

## 2022-09-27 DIAGNOSIS — I2510 Atherosclerotic heart disease of native coronary artery without angina pectoris: Secondary | ICD-10-CM | POA: Diagnosis not present

## 2022-09-27 LAB — DECEASED DONOR CL I&II, LOW RES
DONOR LOW RES DRW #1: 51
DONOR LOW RES DRW #2: 51
DONOR LOW RES HLA A #1: 3
DONOR LOW RES HLA A #2: 11
DONOR LOW RES HLA B #1: 18
DONOR LOW RES HLA B #2: 37
DONOR LOW RES HLA BW #1: 6
DONOR LOW RES HLA BW #2: 4
DONOR LOW RES HLA C #1: 6
DONOR LOW RES HLA C #2: 7
DONOR LOW RES HLA DQ #1: 6
DONOR LOW RES HLA DQ #2: 6
DONOR LOW RES HLA DR #1: 15
DONOR LOW RES HLA DR #2: 15

## 2022-09-27 LAB — TACROLIMUS LEVEL: TACROLIMUS BLOOD: 7.1 ng/mL (ref 2.0–20.0)

## 2022-09-28 ENCOUNTER — Ambulatory Visit: Admit: 2022-09-28 | Discharge: 2022-09-28 | Payer: MEDICARE

## 2022-09-28 DIAGNOSIS — M899 Disorder of bone, unspecified: Secondary | ICD-10-CM | POA: Diagnosis not present

## 2022-09-28 DIAGNOSIS — E1122 Type 2 diabetes mellitus with diabetic chronic kidney disease: Secondary | ICD-10-CM | POA: Diagnosis not present

## 2022-09-28 DIAGNOSIS — I151 Hypertension secondary to other renal disorders: Secondary | ICD-10-CM | POA: Diagnosis not present

## 2022-09-28 DIAGNOSIS — N189 Chronic kidney disease, unspecified: Secondary | ICD-10-CM | POA: Diagnosis not present

## 2022-09-28 DIAGNOSIS — Z794 Long term (current) use of insulin: Secondary | ICD-10-CM | POA: Diagnosis not present

## 2022-09-28 DIAGNOSIS — N186 End stage renal disease: Secondary | ICD-10-CM | POA: Diagnosis not present

## 2022-09-28 DIAGNOSIS — Z79899 Other long term (current) drug therapy: Secondary | ICD-10-CM | POA: Diagnosis not present

## 2022-09-28 DIAGNOSIS — Z940 Kidney transplant status: Secondary | ICD-10-CM | POA: Diagnosis not present

## 2022-09-28 LAB — RENAL FUNCTION PANEL
ALBUMIN: 3.5 g/dL (ref 3.4–5.0)
ANION GAP: 7 mmol/L (ref 5–14)
BLOOD UREA NITROGEN: 63 mg/dL — ABNORMAL HIGH (ref 9–23)
BUN / CREAT RATIO: 38
CALCIUM: 8.8 mg/dL (ref 8.7–10.4)
CHLORIDE: 112 mmol/L — ABNORMAL HIGH (ref 98–107)
CO2: 23 mmol/L (ref 20.0–31.0)
CREATININE: 1.68 mg/dL — ABNORMAL HIGH
EGFR CKD-EPI (2021) FEMALE: 32 mL/min/{1.73_m2} — ABNORMAL LOW (ref >=60)
GLUCOSE RANDOM: 130 mg/dL — ABNORMAL HIGH (ref 70–99)
PHOSPHORUS: 3 mg/dL (ref 2.4–5.1)
POTASSIUM: 4.7 mmol/L (ref 3.4–4.8)
SODIUM: 142 mmol/L (ref 135–145)

## 2022-09-28 LAB — URINALYSIS WITH MICROSCOPY
BILIRUBIN UA: NEGATIVE
BLOOD UA: NEGATIVE
GLUCOSE UA: NEGATIVE
KETONES UA: NEGATIVE
LEUKOCYTE ESTERASE UA: NEGATIVE
NITRITE UA: NEGATIVE
PH UA: 5.5 (ref 5.0–9.0)
PROTEIN UA: NEGATIVE
RBC UA: 2 /HPF (ref <4)
SPECIFIC GRAVITY UA: 1.02 (ref 1.005–1.030)
SQUAMOUS EPITHELIAL: 20 /HPF — ABNORMAL HIGH (ref 0–5)
TRANSITIONAL EPITHELIAL: 1 /HPF (ref 0–2)
UROBILINOGEN UA: 0.2
WBC UA: 2 /HPF (ref 0–5)

## 2022-09-28 LAB — CBC W/ AUTO DIFF
BASOPHILS ABSOLUTE COUNT: 0 10*9/L (ref 0.0–0.1)
BASOPHILS RELATIVE PERCENT: 0.4 %
EOSINOPHILS ABSOLUTE COUNT: 0 10*9/L (ref 0.0–0.5)
EOSINOPHILS RELATIVE PERCENT: 0.6 %
HEMATOCRIT: 25.6 % — ABNORMAL LOW (ref 34.0–44.0)
HEMOGLOBIN: 8.9 g/dL — ABNORMAL LOW (ref 11.3–14.9)
LYMPHOCYTES ABSOLUTE COUNT: 0.9 10*9/L — ABNORMAL LOW (ref 1.1–3.6)
LYMPHOCYTES RELATIVE PERCENT: 19 %
MEAN CORPUSCULAR HEMOGLOBIN CONC: 34.6 g/dL (ref 32.0–36.0)
MEAN CORPUSCULAR HEMOGLOBIN: 33.1 pg — ABNORMAL HIGH (ref 25.9–32.4)
MEAN CORPUSCULAR VOLUME: 95.8 fL — ABNORMAL HIGH (ref 77.6–95.7)
MEAN PLATELET VOLUME: 7.8 fL (ref 6.8–10.7)
MONOCYTES ABSOLUTE COUNT: 0.2 10*9/L — ABNORMAL LOW (ref 0.3–0.8)
MONOCYTES RELATIVE PERCENT: 4.3 %
NEUTROPHILS ABSOLUTE COUNT: 3.6 10*9/L (ref 1.8–7.8)
NEUTROPHILS RELATIVE PERCENT: 75.7 %
NUCLEATED RED BLOOD CELLS: 0 /100{WBCs} (ref <=4)
PLATELET COUNT: 74 10*9/L — ABNORMAL LOW (ref 150–450)
RED BLOOD CELL COUNT: 2.68 10*12/L — ABNORMAL LOW (ref 3.95–5.13)
RED CELL DISTRIBUTION WIDTH: 17.1 % — ABNORMAL HIGH (ref 12.2–15.2)
WBC ADJUSTED: 4.7 10*9/L (ref 3.6–11.2)

## 2022-09-28 LAB — TACROLIMUS LEVEL, TROUGH: TACROLIMUS, TROUGH: 5.4 ng/mL (ref 5.0–15.0)

## 2022-09-28 LAB — PROTEIN / CREATININE RATIO, URINE
CREATININE, URINE: 87.3 mg/dL
PROTEIN URINE: 10.6 mg/dL
PROTEIN/CREAT RATIO, URINE: 0.121

## 2022-09-28 LAB — BK VIRUS QUANTITATIVE PCR, BLOOD: BK BLOOD RESULT: NOT DETECTED

## 2022-09-28 LAB — MAGNESIUM: MAGNESIUM: 1.8 mg/dL (ref 1.6–2.6)

## 2022-09-28 MED ORDER — SPIRONOLACTONE 25 MG TABLET
ORAL_TABLET | Freq: Every day | ORAL | 3 refills | 90 days | Status: CP
Start: 2022-09-28 — End: 2023-09-28

## 2022-09-28 NOTE — Unmapped (Addendum)
Adding spironolactone 25 mg  Stop calcium (Tums)  Increase Lantus to 12 units    Eat more phosphorous  Continue to be careful with high potassium foods

## 2022-09-28 NOTE — Unmapped (Signed)
Met w/ patient in [facility] today. Reviewed meds/symptoms. Any new medications?                 Fever/cold/flu symptoms denies  BP: 160/46 today/ Home BP reported 160-170/50s  BG: am 180-220, daytime 270-280 - will refer to endocrinology   Headache/Dizziness/Lightheaded: denies  Hand tremors: denies  Numbness/tingling: denies  Fevers/chills/sweats: denies  CP/SOB/palpatations: denies  Nausea/vomiting/heartburn: denies  Diarrhea/constipation: reports loose stool some days - can change colace/senna to PRN  UTI symptoms (burn/pain/itch/frequency/urgency/odor/color/foam): denies  No visible or palpable edema     Appetite good; reports adequate hydration. 30-50 oz fluid per day.     Pt reports being well rested and getting adequate exercise.     Continues to follow Covid/health safety precautions by taking care to mask, perform frequent hand hygeine and minimal public activity. Offered support and guidance for this process given their immune-suppressed state. We discussed reduced covid vaccine coverage for transplant patients and importance of continuing to mask and practice safe distancing. Commented that booster vaccines will likely be advised as an ongoing process.     Pain: denies       Last tacrolimus taken 2100; held for this morning's labs. 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 Elvera Maria and in person follow up in 6-8 weeks

## 2022-09-28 NOTE — Unmapped (Signed)
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 Without significant proteinuria.   DSAs: not present as of 09/06/22  Post-surgical issues:  - aspirin for 1 year post-op if tolerated (history pre-txp thrombocytopenia)  - ureteral stent removed 2/15    # Immunosuppression  Tacrolimus (Prograf) 2 mg AM, 1 mg PM, last adjustment 3/1, goal trough 7-9 ng/mL  Mycophenolate (Cellcept/Myfortic) 500 mg BID  Prednisone 5 mg daily    # Acute issues today  none    # BP management   Goal 130/80, as tolerated. Currently signficiantly higher at home. Her tac is slightly over goal, likely contributing.   - amlodipine 10 mg  - carvedilol 25 mg BID  - clonidine 0.1 mg TID  - hydralazine 100 mg TID  - chorthalidone 25 mg daily  - add spironolactone 25 mg daily    # Infectious disease  CMV D+/R+, EBV D+/R+  CMV ppx:  Valcyte x3 mo  PJP ppx: Bactrim x6 mo  Possible thrush: nystatin  - swish and swallow    # Anemia screening  Improving    # Cardiovascular: primary prevention  The 10-year ASCVD risk score (Arnett DK, et al., 2019) is: 41.6%  - atorvastatain    # CKD-BMD  Ca, phos normal. Check Vit D as protocol    # Electrolytes  Reviewed, acceptable    # DM2  - Lantus 10 units takes a lunch - increase to 12 units  - Novolog 3 units TID AC + SSI - increase to 4 units    # Comorbidities      # Immunizations  Immunization History   Administered Date(s) Administered    COVID-19 VAC,BIVALENT(92YR UP),PFIZER 09/04/2019, 09/25/2019    COVID-19 VAC,MRNA,TRIS(12Y UP)(PFIZER)(GRAY CAP) 09/04/2019, 09/25/2019, 11/27/2020 COVID-19 VACC,MRNA,(PFIZER)(PF) 09/04/2019, 09/25/2019, 04/29/2020    Covid-19 Vacc, Unspecified 09/04/2019, 09/04/2019, 09/25/2019, 09/25/2019    HEPATITIS B VACCINE ADULT,IM(ENERGIX B, RECOMBIVAX) 04/27/2017    Hepatitis B Vaccine, Unspecified Formulation 04/27/2017    INFLUENZA INJ MDCK PF, QUAD,(FLUCELVAX)(34MO AND UP EGG FREE) 06/29/2017    INFLUENZA QUAD HIGH DOSE 17YRS+(FLUZONE) 04/05/2016, 04/27/2017, 05/31/2018, 04/10/2019    Influenza Virus Vaccine, unspecified formulation 04/24/2015, 05/26/2015, 04/05/2016, 04/05/2016, 04/27/2017, 04/27/2017, 05/31/2018, 05/31/2018, 04/10/2019, 04/10/2019, 05/09/2019, 04/29/2020, 05/09/2021, 05/09/2021, 04/24/2022, 04/26/2022    PNEUMOCOCCAL POLYSACCHARIDE 23-VALENT 07/09/2015    PPD Test 12/19/2015, 10/24/2016, 11/26/2017, 03/08/2019, 01/26/2020, 12/22/2020, 01/04/2022    Pneumococcal Conjugate 13-Valent 07/31/2018, 08/02/2018    Pneumococcal vaccine, Unspecified Formulation 06/28/2016, 08/26/2021    Pneumococcal, Unspecified Formulation 08/26/2021    TdaP 09/25/2016       # Cancer screening  PAP smear: does not do  Mammogram: negative 11/2021  Colonoscopy: may be due  Skin: recommend yearly dermatology evaluation    # Follow up:  Labs 2x/week  Visits 4 weeks      Kidney Transplant History:   Date of Transplant: 08/06/2022 (Kidney)  Type of Transplant: DCD  KDPI: 56%  Cold ischemic time: 877 minutes (14 hr 37 min)  Warm ischemic time: 32 minutes  cPRA: 0%  HLA match:   Blood type: Donor A1, Recipient A POS  ID: CMV D+/R+, EBV D+/R+  Native Kidney Disease: Diabetes              Native kidney biopsy: Diabetic nephropathy (done elsewhere)              Pre-transplant dialysis course: PD started July 2017, transitioned to HD October 2019 due to catheter dysfunction/inadequate dialysis  Prior Transplants: None  Induction: Basiliximab  Early steroid withdrawal: No    Biopsies:   None    History of Presenting Illness:     Since the last visit:  - much better energy, enjoying doing some organizing in her home  - home BP typically 160-170, aymptomatic  - BG: AM 1874-227, daytime 270-280 range    Concerns about nonadherence: Yes, patient sundowns, her daughters can't be in the home in the evening so they watch her take her meds via FaceTime call    Social: Lives in [place] with her husband (APS case of concerns for safety). Two caregivers are daughters, one lives nearby. Patient speaks [place] and [place].    Review of Systems:   A 12-system review was negative except as documented in the HPI.    Physical Exam:     BP 160/46 (BP Site: R Arm, BP Position: Sitting, BP Cuff Size: Medium)  - Pulse 66  - Temp 36.4 ??C (97.6 ??F) (Temporal)  - Ht 152.4 cm (5')  - Wt 59.7 kg (131 lb 9.6 oz)  - BMI 25.70 kg/m??   Constitutional:  Well-appearing in NAD  Eyes:  anicteric sclerae  ENT:  MMM  CV:  RRR, no m/r/g, no JVD, extremities WWP with no edema  Resp:  Good air movement, CTAB  GI:  Abdomen soft, NTND, +bs  MSK:  Grossly normal, exam is limited  Skin:  Normal turgor, no rash  Neuro:  Grossly normal, exam is limited  Psych:  Normal affect      Allergies:   Allergies   Allergen Reactions    Penicillins Other (See Comments)     patient denies PCN allergy and states she had formal allergy testing that ruled out PCN allergy. 09/27/18    Prior allergy comment states hypotension and rash with PCN, patient does not recall this    Oxycodone Nausea And Vomiting     Per Pt cannot tolerate oxycodone    Valacyclovir Nausea Only        Current Medications:   Current Outpatient Medications   Medication Sig Dispense Refill    acetaminophen (TYLENOL) 500 MG tablet Take 1-2 tablets (500-1,000 mg total) by mouth every eight (8) hours as needed for pain or fever. 60 tablet 11    amlodipine (NORVASC) 10 MG tablet Take 1 tablet (10 mg total) by mouth daily. 90 tablet 3    aspirin (ECOTRIN) 81 MG tablet Take 1 tablet (81 mg total) by mouth daily. 90 tablet 3    atorvastatin (LIPITOR) 10 MG tablet Take 1 tablet (10 mg total) by mouth daily. 90 tablet 3    blood sugar diagnostic (ACCU-CHEK AVIVA PLUS TEST STRP) Strp USE 1 STRIP TO CHECK GLUCOSE THREE TIMES DAILY      blood sugar diagnostic (GLUCOSE BLOOD) Strp Use to check blood sugar as directed with insulin 3 times a day & for symptoms of high or low blood sugar. 100 strip 11    blood sugar diagnostic Strp USE 1 STRIP TO CHECK GLUCOSE THREE TIMES DAILY      blood-glucose meter kit Use as directed. ACCU-CHEK AVIVA  blood-glucose meter kit Use as instructed 1 each 0    blood-glucose sensor (DEXCOM G7 SENSOR) Devi Use 1 sensor every 10 days. 3 each 11    calcium carbonate (TUMS) 200 mg calcium (500 mg) chewable tablet Chew 2 tablets (400 mg of elem calcium total) Three (3) times a day. 540 tablet 3    carvedilol (COREG) 25 MG tablet Take 1 tablet (25 mg total) by mouth two (2) times a day. 180 tablet 3    chlorthalidone (HYGROTON) 25 MG tablet Take 1 tablet (25 mg total) by mouth daily. 90 tablet 3    cloNIDine (CATAPRES-TTS) 0.3 mg/24 hr Place 1 patch on the skin once a week. 4 patch 0    [START ON 10/04/2022] cloNIDine (CATAPRES-TTS) 0.3 mg/24 hr Place 1 patch on the skin once a week. 4 patch 11    docusate sodium (COLACE) 100 MG capsule Take 1 capsule (100 mg total) by mouth two (2) times a day as needed for constipation. 60 capsule 11    hydrALAZINE (APRESOLINE) 100 MG tablet Take 1 tablet (100 mg total) by mouth every eight (8) hours. 270 tablet 3    insulin aspart (NOVOLOG FLEXPEN U-100 INSULIN) 100 unit/mL (3 mL) injection pen Inject 0.03 mL (3 Units total) under the skin Three (3) times a day before meals AND 0-0.08 mL (0-8 Units total) Three (3) times a day before meals. Max 33 units per day. 29.7 mL 3    insulin glargine (BASAGLAR, LANTUS) 100 unit/mL (3 mL) injection pen Inject 0.05-0.1 mL (5-10 Units total) under the skin nightly. 9 mL 3    lancets Misc Use to check blood sugar as directed with insulin 3 times a day & for symptoms of high or low blood sugar. 100 each 0    levothyroxine (SYNTHROID) 112 MCG tablet Take 1 tablet (112 mcg total) by mouth daily. 90 tablet 3    magnesium oxide-Mg AA chelate (MAGNESIUM, AMINO ACID CHELATE,) 133 mg Take 1 tablet by mouth two (2) times a day. Hold until directed to take by nurse coordinator 60 tablet 11    mycophenolate (CELLCEPT) 250 mg capsule Take 2 capsules (500 mg total) by mouth two (2) times a day. 360 capsule 3    pantoprazole (PROTONIX) 40 MG tablet Take 1 tablet (40 mg total) by mouth daily. 90 tablet 3    pen needle, diabetic 32 gauge x 5/32 (4 mm) Ndle Use with insulin up to 4 times/day as needed. 100 each 11    pen needle, diabetic 32 gauge x 5/32 Ndle Use with insulin 2 times daily      polyethylene glycol (GLYCOLAX) 17 gram/dose powder Take 17 g by mouth daily as needed. 510 g 11    predniSONE (DELTASONE) 5 MG tablet Take 1 tablet (5 mg total) by mouth in the morning. 90 tablet 3    SENNA 8.6 mg tablet Take 1 tablet by mouth daily as needed for constipation. 30 tablet 11    sulfamethoxazole-trimethoprim (BACTRIM) 400-80 mg per tablet Take 1 tablet (80 mg of trimethoprim total) by mouth Every Monday, Wednesday, and Friday. 36 tablet 1    tacrolimus (PROGRAF) 1 MG capsule Take 2 capsules (2 mg total) by mouth daily AND 1 capsule (1 mg total) nightly. 270 capsule 3    valGANciclovir (VALCYTE) 450 mg tablet Take 1 tablet (450 mg total) by mouth every other day. 45 tablet 0     No current facility-administered medications for this visit.  Past Medical History:   Past Medical History:   Diagnosis Date    Anemia     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)     since July 2017    Hypertension     Hypothyroidism (acquired)     Kidney transplant status, cadaveric 08/08/2022    Type 2 diabetes mellitus (CMS-HCC)         Laboratory studies:   Reviewed recent results.        Electronically signed by:   Leafy Half, MD  [facility]

## 2022-09-29 NOTE — Unmapped (Signed)
Reviewed recent labs and tacrolimus level 5.4 with [place] Saliva, pt advised to increase tacrolimus to 2 mg bid.  Detailed VM left for pts daughter Lorene Dy and asked to call [facility] with questions or concerns.

## 2022-10-02 DIAGNOSIS — Z940 Kidney transplant status: Principal | ICD-10-CM

## 2022-10-03 ENCOUNTER — Institutional Professional Consult (permissible substitution): Admit: 2022-10-03 | Discharge: 2022-10-04 | Payer: MEDICARE

## 2022-10-03 DIAGNOSIS — Z940 Kidney transplant status: Secondary | ICD-10-CM | POA: Diagnosis not present

## 2022-10-03 NOTE — Unmapped (Signed)
Clinical Social Worker Progress Note    Name:Amyjo Italee Veles  Date of Birth:22-Apr-1951  ZOX:[id number]    RE: NO SHOW      Noted that patient had appointment scheduled today at 10:30am.  As of the writing of this note, patient is a no show/no call.    At this time, this CSW will be unable to see patient due to inability to reach pt.  Called all numbers listed in EPIC; no answer.  Left VM on pt's cell #.    Patient will need to be rescheduled at this time.        Lowella Petties, LCSW, CCTSW  Transplant Case Manager  [facility]

## 2022-10-04 DIAGNOSIS — J811 Chronic pulmonary edema: Secondary | ICD-10-CM | POA: Diagnosis not present

## 2022-10-04 DIAGNOSIS — T80818A Extravasation of other vesicant agent, initial encounter: Secondary | ICD-10-CM | POA: Diagnosis not present

## 2022-10-04 DIAGNOSIS — N183 Chronic kidney disease, stage 3 unspecified: Secondary | ICD-10-CM | POA: Diagnosis not present

## 2022-10-04 DIAGNOSIS — N200 Calculus of kidney: Secondary | ICD-10-CM | POA: Diagnosis not present

## 2022-10-04 DIAGNOSIS — I10 Essential (primary) hypertension: Secondary | ICD-10-CM | POA: Diagnosis not present

## 2022-10-04 DIAGNOSIS — N189 Chronic kidney disease, unspecified: Secondary | ICD-10-CM | POA: Diagnosis not present

## 2022-10-04 DIAGNOSIS — K769 Liver disease, unspecified: Secondary | ICD-10-CM | POA: Diagnosis not present

## 2022-10-04 DIAGNOSIS — B3781 Candidal esophagitis: Secondary | ICD-10-CM | POA: Diagnosis not present

## 2022-10-04 DIAGNOSIS — K3189 Other diseases of stomach and duodenum: Secondary | ICD-10-CM | POA: Diagnosis not present

## 2022-10-04 DIAGNOSIS — R5081 Fever presenting with conditions classified elsewhere: Secondary | ICD-10-CM | POA: Diagnosis not present

## 2022-10-04 DIAGNOSIS — J810 Acute pulmonary edema: Secondary | ICD-10-CM | POA: Diagnosis not present

## 2022-10-04 DIAGNOSIS — D631 Anemia in chronic kidney disease: Secondary | ICD-10-CM | POA: Diagnosis not present

## 2022-10-04 DIAGNOSIS — N179 Acute kidney failure, unspecified: Secondary | ICD-10-CM | POA: Diagnosis not present

## 2022-10-04 DIAGNOSIS — R140 Abdominal distension (gaseous): Secondary | ICD-10-CM | POA: Diagnosis not present

## 2022-10-04 DIAGNOSIS — R0600 Dyspnea, unspecified: Secondary | ICD-10-CM | POA: Diagnosis not present

## 2022-10-04 DIAGNOSIS — B9620 Unspecified Escherichia coli [E. coli] as the cause of diseases classified elsewhere: Secondary | ICD-10-CM | POA: Diagnosis not present

## 2022-10-04 DIAGNOSIS — K56609 Unspecified intestinal obstruction, unspecified as to partial versus complete obstruction: Secondary | ICD-10-CM | POA: Diagnosis not present

## 2022-10-04 DIAGNOSIS — T189XXA Foreign body of alimentary tract, part unspecified, initial encounter: Secondary | ICD-10-CM | POA: Diagnosis not present

## 2022-10-04 DIAGNOSIS — R197 Diarrhea, unspecified: Secondary | ICD-10-CM | POA: Diagnosis not present

## 2022-10-04 DIAGNOSIS — R109 Unspecified abdominal pain: Secondary | ICD-10-CM | POA: Diagnosis not present

## 2022-10-04 DIAGNOSIS — R7881 Bacteremia: Secondary | ICD-10-CM | POA: Diagnosis not present

## 2022-10-04 DIAGNOSIS — N10 Acute tubulo-interstitial nephritis: Secondary | ICD-10-CM | POA: Diagnosis not present

## 2022-10-04 DIAGNOSIS — N390 Urinary tract infection, site not specified: Secondary | ICD-10-CM | POA: Diagnosis not present

## 2022-10-04 DIAGNOSIS — B9629 Other Escherichia coli [E. coli] as the cause of diseases classified elsewhere: Secondary | ICD-10-CM | POA: Diagnosis not present

## 2022-10-04 DIAGNOSIS — Z008 Encounter for other general examination: Secondary | ICD-10-CM | POA: Diagnosis not present

## 2022-10-04 DIAGNOSIS — R0602 Shortness of breath: Secondary | ICD-10-CM | POA: Diagnosis not present

## 2022-10-04 DIAGNOSIS — A4151 Sepsis due to Escherichia coli [E. coli]: Secondary | ICD-10-CM | POA: Diagnosis not present

## 2022-10-04 DIAGNOSIS — N059 Unspecified nephritic syndrome with unspecified morphologic changes: Secondary | ICD-10-CM | POA: Diagnosis not present

## 2022-10-04 DIAGNOSIS — T8613 Kidney transplant infection: Secondary | ICD-10-CM | POA: Diagnosis not present

## 2022-10-04 DIAGNOSIS — E875 Hyperkalemia: Secondary | ICD-10-CM | POA: Diagnosis not present

## 2022-10-04 DIAGNOSIS — Z4682 Encounter for fitting and adjustment of non-vascular catheter: Secondary | ICD-10-CM | POA: Diagnosis not present

## 2022-10-04 DIAGNOSIS — R188 Other ascites: Secondary | ICD-10-CM | POA: Diagnosis not present

## 2022-10-04 DIAGNOSIS — D84821 Immunodeficiency due to drugs: Secondary | ICD-10-CM | POA: Diagnosis not present

## 2022-10-04 DIAGNOSIS — F05 Delirium due to known physiological condition: Secondary | ICD-10-CM | POA: Diagnosis not present

## 2022-10-04 DIAGNOSIS — K562 Volvulus: Secondary | ICD-10-CM | POA: Diagnosis not present

## 2022-10-04 DIAGNOSIS — R918 Other nonspecific abnormal finding of lung field: Secondary | ICD-10-CM | POA: Diagnosis not present

## 2022-10-04 DIAGNOSIS — Z20822 Contact with and (suspected) exposure to covid-19: Secondary | ICD-10-CM | POA: Diagnosis not present

## 2022-10-04 DIAGNOSIS — Z452 Encounter for adjustment and management of vascular access device: Secondary | ICD-10-CM | POA: Diagnosis not present

## 2022-10-04 DIAGNOSIS — Z940 Kidney transplant status: Secondary | ICD-10-CM | POA: Diagnosis not present

## 2022-10-04 DIAGNOSIS — R509 Fever, unspecified: Secondary | ICD-10-CM | POA: Diagnosis not present

## 2022-10-04 DIAGNOSIS — R059 Cough, unspecified: Secondary | ICD-10-CM | POA: Diagnosis not present

## 2022-10-04 DIAGNOSIS — R112 Nausea with vomiting, unspecified: Secondary | ICD-10-CM | POA: Diagnosis not present

## 2022-10-04 DIAGNOSIS — Z79621 Long term (current) use of calcineurin inhibitor: Secondary | ICD-10-CM | POA: Diagnosis not present

## 2022-10-04 DIAGNOSIS — D696 Thrombocytopenia, unspecified: Secondary | ICD-10-CM | POA: Diagnosis not present

## 2022-10-04 DIAGNOSIS — B370 Candidal stomatitis: Secondary | ICD-10-CM | POA: Diagnosis not present

## 2022-10-04 DIAGNOSIS — B9689 Other specified bacterial agents as the cause of diseases classified elsewhere: Secondary | ICD-10-CM | POA: Diagnosis not present

## 2022-10-04 DIAGNOSIS — A419 Sepsis, unspecified organism: Secondary | ICD-10-CM | POA: Diagnosis not present

## 2022-10-04 DIAGNOSIS — D849 Immunodeficiency, unspecified: Secondary | ICD-10-CM | POA: Diagnosis not present

## 2022-10-04 LAB — RENAL FUNCTION PANEL
ALBUMIN: 4 g/dL (ref 3.8–4.8)
BLOOD UREA NITROGEN: 48 mg/dL — ABNORMAL HIGH (ref 8–27)
BUN / CREAT RATIO: 33 — ABNORMAL HIGH (ref 12–28)
CALCIUM: 8.5 mg/dL — ABNORMAL LOW (ref 8.7–10.3)
CHLORIDE: 110 mmol/L — ABNORMAL HIGH (ref 96–106)
CO2: 16 mmol/L — ABNORMAL LOW (ref 20–29)
CREATININE: 1.44 mg/dL — ABNORMAL HIGH (ref 0.57–1.00)
EGFR: 38 mL/min/{1.73_m2} — ABNORMAL LOW
GLUCOSE: 231 mg/dL — ABNORMAL HIGH (ref 70–99)
PHOSPHORUS, SERUM: 2.8 mg/dL — ABNORMAL LOW (ref 3.0–4.3)
POTASSIUM: 4.8 mmol/L (ref 3.5–5.2)
SODIUM: 138 mmol/L (ref 134–144)

## 2022-10-04 LAB — VITAMIN D 25 HYDROXY: VITAMIN D, TOTAL (25OH): 34.9 ng/mL (ref 20.0–80.0)

## 2022-10-04 LAB — MAGNESIUM: MAGNESIUM: 1.6 mg/dL (ref 1.6–2.3)

## 2022-10-04 MED ORDER — CLONIDINE 0.3 MG/24 HR WEEKLY TRANSDERMAL PATCH
MEDICATED_PATCH | TRANSDERMAL | 11 refills | 28.00000 days | Status: CP
Start: 2022-10-04 — End: 2023-10-04

## 2022-10-05 ENCOUNTER — Ambulatory Visit: Admit: 2022-10-05 | Payer: MEDICARE

## 2022-10-05 ENCOUNTER — Encounter: Admit: 2022-10-05 | Payer: MEDICARE | Attending: Internal Medicine

## 2022-10-05 ENCOUNTER — Ambulatory Visit
Admit: 2022-10-05 | Discharge: 2022-10-25 | Disposition: A | Discharge status: Home Health | Payer: MEDICARE | Admitting: Nephrology

## 2022-10-05 ENCOUNTER — Encounter: Admit: 2022-10-05 | Payer: MEDICARE

## 2022-10-05 DIAGNOSIS — Z940 Kidney transplant status: Principal | ICD-10-CM

## 2022-10-05 LAB — URINALYSIS WITH MICROSCOPY
BILIRUBIN UA: NEGATIVE
BLOOD UA: NEGATIVE
GLUCOSE UA: NEGATIVE
KETONES UA: NEGATIVE
NITRITE UA: NEGATIVE
PH UA: 5 (ref 5.0–9.0)
PROTEIN UA: NEGATIVE
RBC UA: 1 /HPF (ref <=4)
SPECIFIC GRAVITY UA: 1.009 (ref 1.003–1.030)
SQUAMOUS EPITHELIAL: <1 /HPF (ref 0–5)
UROBILINOGEN UA: <2
WBC UA: 26 /HPF — ABNORMAL HIGH (ref 0–5)

## 2022-10-05 LAB — CBC W/ DIFFERENTIAL
BANDED NEUTROPHILS ABSOLUTE COUNT: 0.1 10*3/uL (ref 0.0–0.1)
BASOPHILS ABSOLUTE COUNT: 0 10*3/uL (ref 0.0–0.2)
BASOPHILS RELATIVE PERCENT: 0 %
EOSINOPHILS ABSOLUTE COUNT: 0 10*3/uL (ref 0.0–0.4)
EOSINOPHILS RELATIVE PERCENT: 0 %
HEMATOCRIT: 27.3 % — ABNORMAL LOW (ref 34.0–46.6)
HEMOGLOBIN: 8.7 g/dL — ABNORMAL LOW (ref 11.1–15.9)
IMMATURE GRANULOCYTES: 2 %
LYMPHOCYTES ABSOLUTE COUNT: 0.8 10*3/uL (ref 0.7–3.1)
LYMPHOCYTES RELATIVE PERCENT: 14 %
MEAN CORPUSCULAR HEMOGLOBIN CONC: 31.9 g/dL (ref 31.5–35.7)
MEAN CORPUSCULAR HEMOGLOBIN: 31.2 pg (ref 26.6–33.0)
MEAN CORPUSCULAR VOLUME: 98 fL — ABNORMAL HIGH (ref 79–97)
MONOCYTES ABSOLUTE COUNT: 0.3 10*3/uL (ref 0.1–0.9)
MONOCYTES RELATIVE PERCENT: 5 %
NEUTROPHILS ABSOLUTE COUNT: 4.6 10*3/uL (ref 1.4–7.0)
NEUTROPHILS RELATIVE PERCENT: 79 %
PLATELET COUNT: 97 10*3/uL — CL (ref 150–450)
RED BLOOD CELL COUNT: 2.79 x10E6/uL — ABNORMAL LOW (ref 3.77–5.28)
RED CELL DISTRIBUTION WIDTH: 15 % (ref 11.7–15.4)
WHITE BLOOD CELL COUNT: 5.9 10*3/uL (ref 3.4–10.8)

## 2022-10-05 LAB — COMPREHENSIVE METABOLIC PANEL
ALBUMIN: 3.4 g/dL (ref 3.4–5.0)
ALBUMIN: 3.1 g/dL — ABNORMAL LOW (ref 3.4–5.0)
ALKALINE PHOSPHATASE: 44 U/L — ABNORMAL LOW (ref 46–116)
ALKALINE PHOSPHATASE: 43 U/L — ABNORMAL LOW (ref 46–116)
ALT (SGPT): 20 U/L (ref 10–49)
ALT (SGPT): 19 U/L (ref 10–49)
ANION GAP: 7 mmol/L (ref 5–14)
ANION GAP: 9 mmol/L (ref 5–14)
AST (SGOT): 19 U/L (ref <=34)
AST (SGOT): 21 U/L (ref <=34)
BILIRUBIN TOTAL: 0.3 mg/dL (ref 0.3–1.2)
BILIRUBIN TOTAL: 0.4 mg/dL (ref 0.3–1.2)
BLOOD UREA NITROGEN: 55 mg/dL — ABNORMAL HIGH (ref 9–23)
BLOOD UREA NITROGEN: 48 mg/dL — ABNORMAL HIGH (ref 9–23)
BUN / CREAT RATIO: 27
BUN / CREAT RATIO: 27
CALCIUM: 8.6 mg/dL — ABNORMAL LOW (ref 8.7–10.4)
CALCIUM: 8.2 mg/dL — ABNORMAL LOW (ref 8.7–10.4)
CHLORIDE: 112 mmol/L — ABNORMAL HIGH (ref 98–107)
CHLORIDE: 112 mmol/L — ABNORMAL HIGH (ref 98–107)
CO2: 19 mmol/L — ABNORMAL LOW (ref 20.0–31.0)
CO2: 18 mmol/L — ABNORMAL LOW (ref 20.0–31.0)
CREATININE: 2.05 mg/dL — ABNORMAL HIGH
CREATININE: 1.79 mg/dL — ABNORMAL HIGH
EGFR CKD-EPI (2021) FEMALE: 25 mL/min/{1.73_m2} — ABNORMAL LOW (ref >=60)
EGFR CKD-EPI (2021) FEMALE: 30 mL/min/{1.73_m2} — ABNORMAL LOW (ref >=60)
GLUCOSE RANDOM: 96 mg/dL (ref 70–179)
GLUCOSE RANDOM: 119 mg/dL (ref 70–179)
POTASSIUM: 5.2 mmol/L — ABNORMAL HIGH (ref 3.4–4.8)
POTASSIUM: 5.4 mmol/L — ABNORMAL HIGH (ref 3.4–4.8)
PROTEIN TOTAL: 5.6 g/dL — ABNORMAL LOW (ref 5.7–8.2)
PROTEIN TOTAL: 5.2 g/dL — ABNORMAL LOW (ref 5.7–8.2)
SODIUM: 138 mmol/L (ref 135–145)
SODIUM: 139 mmol/L (ref 135–145)

## 2022-10-05 LAB — TACROLIMUS LEVEL, TROUGH
TACROLIMUS, TROUGH: 3.7 ng/mL — ABNORMAL LOW (ref 5.0–15.0)
TACROLIMUS, TROUGH: 3.6 ng/mL — ABNORMAL LOW (ref 5.0–15.0)

## 2022-10-05 LAB — BLOOD GAS, VENOUS
BASE EXCESS VENOUS: -9 — ABNORMAL LOW (ref -2.0–2.0)
HCO3 VENOUS: 17 mmol/L — ABNORMAL LOW (ref 22–27)
O2 SATURATION VENOUS: 29.2 % — ABNORMAL LOW (ref 40.0–85.0)
PCO2 VENOUS: 38 mmHg — ABNORMAL LOW (ref 40–60)
PH VENOUS: 7.26 — ABNORMAL LOW (ref 7.32–7.43)
PO2 VENOUS: 20 mmHg — ABNORMAL LOW (ref 30–55)

## 2022-10-05 LAB — CBC W/ AUTO DIFF
BASOPHILS ABSOLUTE COUNT: 0 10*9/L (ref 0.0–0.1)
BASOPHILS ABSOLUTE COUNT: 0 10*9/L (ref 0.0–0.1)
BASOPHILS RELATIVE PERCENT: 0.1 %
BASOPHILS RELATIVE PERCENT: 0.2 %
EOSINOPHILS ABSOLUTE COUNT: 0 10*9/L (ref 0.0–0.5)
EOSINOPHILS ABSOLUTE COUNT: 0 10*9/L (ref 0.0–0.5)
EOSINOPHILS RELATIVE PERCENT: 0.1 %
EOSINOPHILS RELATIVE PERCENT: 0.1 %
HEMATOCRIT: 23.8 % — ABNORMAL LOW (ref 34.0–44.0)
HEMATOCRIT: 23.2 % — ABNORMAL LOW (ref 34.0–44.0)
HEMOGLOBIN: 8.2 g/dL — ABNORMAL LOW (ref 11.3–14.9)
HEMOGLOBIN: 7.8 g/dL — ABNORMAL LOW (ref 11.3–14.9)
LYMPHOCYTES ABSOLUTE COUNT: 0.4 10*9/L — ABNORMAL LOW (ref 1.1–3.6)
LYMPHOCYTES ABSOLUTE COUNT: 0.3 10*9/L — ABNORMAL LOW (ref 1.1–3.6)
LYMPHOCYTES RELATIVE PERCENT: 3.4 %
LYMPHOCYTES RELATIVE PERCENT: 3.2 %
MEAN CORPUSCULAR HEMOGLOBIN CONC: 34.3 g/dL (ref 32.0–36.0)
MEAN CORPUSCULAR HEMOGLOBIN CONC: 33.4 g/dL (ref 32.0–36.0)
MEAN CORPUSCULAR HEMOGLOBIN: 32.7 pg — ABNORMAL HIGH (ref 25.9–32.4)
MEAN CORPUSCULAR HEMOGLOBIN: 32.2 pg (ref 25.9–32.4)
MEAN CORPUSCULAR VOLUME: 95.3 fL (ref 77.6–95.7)
MEAN CORPUSCULAR VOLUME: 96.5 fL — ABNORMAL HIGH (ref 77.6–95.7)
MEAN PLATELET VOLUME: 7.8 fL (ref 6.8–10.7)
MEAN PLATELET VOLUME: 8.4 fL (ref 6.8–10.7)
MONOCYTES ABSOLUTE COUNT: 0.5 10*9/L (ref 0.3–0.8)
MONOCYTES ABSOLUTE COUNT: 0.1 10*9/L — ABNORMAL LOW (ref 0.3–0.8)
MONOCYTES RELATIVE PERCENT: 4.3 %
MONOCYTES RELATIVE PERCENT: 1.1 %
NEUTROPHILS ABSOLUTE COUNT: 9.7 10*9/L — ABNORMAL HIGH (ref 1.8–7.8)
NEUTROPHILS ABSOLUTE COUNT: 10.1 10*9/L — ABNORMAL HIGH (ref 1.8–7.8)
NEUTROPHILS RELATIVE PERCENT: 92.1 %
NEUTROPHILS RELATIVE PERCENT: 95.4 %
PLATELET COUNT: 72 10*9/L — ABNORMAL LOW (ref 150–450)
PLATELET COUNT: 64 10*9/L — ABNORMAL LOW (ref 150–450)
RED BLOOD CELL COUNT: 2.5 10*12/L — ABNORMAL LOW (ref 3.95–5.13)
RED BLOOD CELL COUNT: 2.41 10*12/L — ABNORMAL LOW (ref 3.95–5.13)
RED CELL DISTRIBUTION WIDTH: 16.6 % — ABNORMAL HIGH (ref 12.2–15.2)
RED CELL DISTRIBUTION WIDTH: 16.6 % — ABNORMAL HIGH (ref 12.2–15.2)
WBC ADJUSTED: 10.5 10*9/L (ref 3.6–11.2)
WBC ADJUSTED: 10.6 10*9/L (ref 3.6–11.2)

## 2022-10-05 LAB — SLIDE REVIEW

## 2022-10-05 LAB — PHOSPHORUS: PHOSPHORUS: 2.2 mg/dL — ABNORMAL LOW (ref 2.4–5.1)

## 2022-10-05 LAB — SODIUM, URINE, RANDOM: SODIUM URINE: 35 mmol/L

## 2022-10-05 LAB — HIGH SENSITIVITY TROPONIN I - SINGLE: HIGH SENSITIVITY TROPONIN I: 26 ng/L (ref <=34)

## 2022-10-05 LAB — MAGNESIUM
MAGNESIUM: 1.2 mg/dL — ABNORMAL LOW (ref 1.6–2.6)
MAGNESIUM: 1.1 mg/dL — ABNORMAL LOW (ref 1.6–2.6)
MAGNESIUM: 1.7 mg/dL (ref 1.6–2.6)

## 2022-10-05 LAB — TACROLIMUS LEVEL: TACROLIMUS BLOOD: 7.4 ng/mL (ref 2.0–20.0)

## 2022-10-05 LAB — LACTATE SEPSIS, VENOUS: LACTATE BLOOD VENOUS: 1.4 mmol/L (ref 0.5–1.8)

## 2022-10-05 LAB — CREATININE, URINE: CREATININE, URINE: 40.9 mg/dL

## 2022-10-05 LAB — LACTATE, VENOUS, WHOLE BLOOD: LACTATE BLOOD VENOUS: 2.6 mmol/L — ABNORMAL HIGH (ref 0.5–1.8)

## 2022-10-05 MED ADMIN — cefepime (MAXIPIME) 2 g in sodium chloride 0.9 % (NS) 100 mL IVPB-MBP: 2 g | INTRAVENOUS | @ 08:00:00 | Stop: 2022-10-05

## 2022-10-05 MED ADMIN — hydrALAZINE (APRESOLINE) tablet 100 mg: 100 mg | ORAL | @ 14:00:00

## 2022-10-05 MED ADMIN — heparin (porcine) 5,000 unit/mL injection 5,000 Units: 5000 [IU] | SUBCUTANEOUS | @ 19:00:00

## 2022-10-05 MED ADMIN — metroNIDAZOLE (FLAGYL) tablet 500 mg: 500 mg | ORAL | @ 14:00:00 | Stop: 2022-10-05

## 2022-10-05 MED ADMIN — cefepime (MAXIPIME) 1 g in sodium chloride 0.9 % (NS) 100 mL IVPB-MBP: 1 g | INTRAVENOUS | @ 22:00:00

## 2022-10-05 MED ADMIN — hydrALAZINE (APRESOLINE) tablet 100 mg: 100 mg | ORAL | @ 19:00:00

## 2022-10-05 MED ADMIN — vancomycin (VANCOCIN) 1250 mg in sodium chloride (NS) 0.9% 250 mL IVPB: 1250 mg | INTRAVENOUS | @ 09:00:00 | Stop: 2022-10-05

## 2022-10-05 MED ADMIN — furosemide (LASIX) 10 mg/mL injection: INTRAVENOUS | @ 15:00:00 | Stop: 2022-10-05

## 2022-10-05 MED ADMIN — lactated ringers bolus 1,000 mL: 1000 mL | INTRAVENOUS | @ 09:00:00 | Stop: 2022-10-05

## 2022-10-05 MED ADMIN — tacrolimus (PROGRAF) capsule 2 mg: 2 mg | ORAL | @ 14:00:00

## 2022-10-05 MED ADMIN — sodium chloride 0.9% (NS) bolus 800 mL: 800 mL | INTRAVENOUS | @ 11:00:00 | Stop: 2022-10-05

## 2022-10-05 MED ADMIN — amlodipine (NORVASC) tablet 10 mg: 10 mg | ORAL | @ 14:00:00

## 2022-10-05 MED ADMIN — acetaminophen (TYLENOL) tablet 650 mg: 650 mg | ORAL | @ 19:00:00

## 2022-10-05 MED ADMIN — atorvastatin (LIPITOR) tablet 10 mg: 10 mg | ORAL | @ 14:00:00

## 2022-10-05 MED ADMIN — magnesium sulfate 2gm/50mL IVPB: 2 g | INTRAVENOUS | @ 14:00:00 | Stop: 2022-10-05

## 2022-10-05 MED ADMIN — pantoprazole (Protonix) EC tablet 40 mg: 40 mg | ORAL | @ 14:00:00

## 2022-10-05 MED ADMIN — acetaminophen (OFIRMEV) 10 mg/mL injection 1,000 mg: 1000 mg | INTRAVENOUS | @ 10:00:00 | Stop: 2022-10-05

## 2022-10-05 MED ADMIN — valGANciclovir (VALCYTE) tablet 450 mg: 450 mg | ORAL | @ 14:00:00

## 2022-10-05 MED ADMIN — metroNIDAZOLE (FLAGYL) tablet 500 mg: 500 mg | ORAL | @ 19:00:00 | Stop: 2022-10-05

## 2022-10-05 MED ADMIN — predniSONE (DELTASONE) tablet 5 mg: 5 mg | ORAL | @ 14:00:00

## 2022-10-05 MED ADMIN — furosemide (LASIX) injection 80 mg: 80 mg | INTRAVENOUS | @ 15:00:00 | Stop: 2022-10-05

## 2022-10-05 NOTE — Unmapped (Signed)
Internal Medicine (MEDW) History & Physical    Assessment & Plan:   Julie [name] is a 72 y.o. female whose presentation is complicated by DDKT, HTN, hypothyroidism, T2DM that presented to [facility] with fevers, nausea and vomiting, diarrhea.     Active Problems:    * No active hospital problems. *  Resolved Problems:    * No resolved hospital problems. *        Active Problems    Fever immunocompromised- Nausea/vomiting- diarrhea   Patient presenting with 48-hour history of fevers, febrile in ED 100.4, 24-hour history of nausea/vomiting and diarrhea.  No leukocytosis on labs, patient does have left shift with neutrophils of 9.7.  Unclear etiology most likely gastroenteritis given acute nausea vomiting diarrhea, could also be secondary to gram-negative bacteremia.  Lower suspicion of respiratory, urinary source given patient is asymptomatic.  Patient is immunocompromise and is on tacrolimus, CellCept, prednisone.  Will start patient on broad-spectrum antibiotics and do full infectious workup.  -Consider ICID consult  -F/U BCx, BCx, CMV, EBV  -c diff, GIPP  -F/u chest x ray  -Continue vancomycin, cefepime  -Continue tacrolimus, s/p 2 tacrolimus trough  -Increase prednisone to 10 Mg in setting   -Hold CellCept  -Continue bactrim for PJP prophylaxis  -Continue valganciclovir for CMV prophylaxis    DDKT in 2024  DCD, KDPI 56%, Basiliximab induction, CMV +/+, EBV +/+, standard anatomy. Post kidney transplant course has been notable for: E coli UTI.  Renal function still pending, patient most likely has prerenal AKI given poor p.o. intake.  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  No bleeding noted.  60 decreased platelet may have been reactive secondary to acute infection.  -Daily CBC    T2DM  Last A1C 5.6.  Home regimen Lantus 12 units nightly, lispro 3 units with meals, with sliding scale.  Will decrease insulin regimen given poor p.o. intake.  -Decrease Lantus 6 units nightly  -SSI  -Hold mealtime insulin    HTN  Will continue hypertension medication since patient is slightly hypertensive, will hold any nephrotoxic agents  - amlodipine 10 mg  - carvedilol 25 mg BID  - clonidine 0.1 mg TID  - hydralazine 100 mg TID  -Hold chorthalidone 25 mg daily  -Hold spironolactone 25 mg daily    Chronic Problems    Hypothyroidism: Continue Synthroid    The patient's presentation is complicated by the following clinically significant conditions requiring additional evaluation and treatment: - Thrombocytopenia POA requiring further investigation or monitor  - Chronic kidney disease POA requiring further investigation, treatment, or monitoring   - Hypokalemia POA requiring further investigation, treatment, or monitoring  - Acidosis POA requiring further investigation, treatment, or monitoring         Issues Impacting Complexity of Management:  -Intensive monitoring of drug toxicity from Vancomycin with scheduled BMP and/or Vancomycin levels and Cefepime with scheduled BMP        Checklist:  Diet: Regular Diet  DVT PPx: Heparin 5000units q8h  Code Status: Full Code  Dispo: Patient appropriate for Inpatient based on expectation of ongoing need for hospitalization greater than two midnights based on severity of presentation/services including IV abx    Team Contact Information:   Primary Team: Internal Medicine (MEDW)  Primary Resident: Gracelyn Nurse, MD  Resident's Pager: [phone] [facility] Senior Resident)  Chief Concern:   No Principal Problem: There is no principal problem currently on the Problem List. Please update the Problem List and refresh.      Subjective:   Julie [name] is a 72 y.o. female with pertinent PMHx of DDKT, HTN, hypothyroidism, T2DM presenting with 2 day history of Fevers, nausea, vomiting and diarrhea.        History obtained by patient, daugheter and son in law at bedside.       HPI:  Per patient and daughter at bedside patient was in usual state of health till about 4848 hours ago.  She started developing fevers 48-hour history of subjective fevers, and 24-hour history of nausea vomiting, diarrhea.  Per daughter she did not see any blood with emesis or diarrhea.  Patient endorses seeing red streaks and diarrhea.  Patient states she has had a hard time keeping food down, and did miss 9 PM dose of medications on 10/04/22.  Patient denies headache, neck pain, chest pain, abdominal pain, SOB, cough, abdominal pain.    In ED patient afebrile 100.4, otherwise hemodynamically stable.  Labs remarkable for for Hgb 8.2, platelets 72.  Pending CMP.  Patient received vancomycin and cefepime, was given 1.8 L of LR/NS.      Pertinent Surgical Hx  DDKT 2024    Pertinent Family Hx  None    Pertinent Social Hx   Denies any alcohol, tobacco, illicit drug use    Allergies  Penicillins, Oxycodone, and Valacyclovir    I reviewed the Medication List. The current list is Accurate  Prior to Admission medications    Medication Dose, Route, Frequency   acetaminophen (TYLENOL) 500 MG tablet 500-1,000 mg, Oral, Every 8 hours PRN   amlodipine (NORVASC) 10 MG tablet 10 mg, Oral, Daily (standard)   aspirin (ECOTRIN) 81 MG tablet 81 mg, Oral, Daily (standard)   atorvastatin (LIPITOR) 10 MG tablet 10 mg, Oral, Daily (standard)   blood sugar diagnostic (ACCU-CHEK AVIVA PLUS TEST STRP) Strp USE 1 STRIP TO CHECK GLUCOSE THREE TIMES DAILY   blood sugar diagnostic (GLUCOSE BLOOD) Strp Use to check blood sugar as directed with insulin 3 times a day & for symptoms of high or low blood sugar.   blood sugar diagnostic Strp USE 1 STRIP TO CHECK GLUCOSE THREE TIMES DAILY   blood-glucose meter kit Use as directed. ACCU-CHEK AVIVA   blood-glucose meter kit Use as instructed   blood-glucose sensor (DEXCOM G7 SENSOR) Devi Use 1 sensor every 10 days.   calcium carbonate (TUMS) 200 mg calcium (500 mg) chewable tablet 400 mg of elem calcium, Oral, 3 times a day (standard)   carvedilol (COREG) 25 MG tablet 25 mg, Oral, 2 times a day (standard)   chlorthalidone (HYGROTON) 25 MG tablet 25 mg, Oral, Daily (standard)   cloNIDine (CATAPRES-TTS) 0.3 mg/24 hr 1 patch, Transdermal, Weekly   cloNIDine (CATAPRES-TTS) 0.3 mg/24 hr 1 patch, Transdermal, Weekly   docusate sodium (COLACE) 100 MG capsule 100 mg, Oral, 2 times a day PRN   hydrALAZINE (APRESOLINE) 100 MG tablet 100 mg, Oral, Every 8 hours   insulin aspart (NOVOLOG FLEXPEN U-100 INSULIN) 100 unit/mL (3 mL) injection pen Inject 0.03 mL (3 Units total) under the skin Three (3) times a day before meals AND 0-0.08 mL (0-8 Units total) Three (3) times a day before meals. Max 33 units per day.   insulin glargine (BASAGLAR, LANTUS) 100 unit/mL (3 mL) injection pen 5-10 Units, Subcutaneous, Nightly   lancets Misc Use to check  blood sugar as directed with insulin 3 times a day & for symptoms of high or low blood sugar.   levothyroxine (SYNTHROID) 112 MCG tablet 112 mcg, Oral, Daily (standard)   magnesium oxide-Mg AA chelate (MAGNESIUM, AMINO ACID CHELATE,) 133 mg 1 tablet, Oral, 2 times a day, Hold until directed to take by nurse coordinator   mycophenolate (CELLCEPT) 250 mg capsule 500 mg, Oral, 2 times a day (standard)   pantoprazole (PROTONIX) 40 MG tablet 40 mg, Oral, Daily (standard)   pen needle, diabetic 32 gauge x 5/32 (4 mm) Ndle Use with insulin up to 4 times/day as needed.   pen needle, diabetic 32 gauge x 5/32 Ndle Use with insulin 2 times daily   polyethylene glycol (GLYCOLAX) 17 gram/dose powder 17 g, Oral, Daily PRN   predniSONE (DELTASONE) 5 MG tablet 5 mg, Oral, Daily   SENNA 8.6 mg tablet 1 tablet, Oral, Daily PRN   spironolactone (ALDACTONE) 25 MG tablet 25 mg, Oral, Daily (standard)   sulfamethoxazole-trimethoprim (BACTRIM) 400-80 mg per tablet 1 tablet, Oral, Every Mon-Wed-Fri   tacrolimus (PROGRAF) 1 MG capsule Take 2 capsules (2 mg total) by mouth daily AND 1 capsule (1 mg total) nightly.   valGANciclovir (VALCYTE) 450 mg tablet 450 mg, Oral, Every other day       [employer] Decision Maker:  Ms. Scaffidi currently has decisional capacity for healthcare decision-making and is able to designate a surrogate healthcare decision maker. Ms. Soos designated healthcare decision maker(s) is/are Elenoa Spraggins (the patient's spouse) as denoted by stated patient preference.    Objective:   Physical Exam:  Temp:  [37.5 ??C (99.5 ??F)-38 ??C (100.4 ??F)] 38 ??C (100.4 ??F)  Heart Rate:  [76-88] 83  Resp:  [18-20] 18  BP: (131-166)/(47-64) 131/64  SpO2:  [99 %-100 %] 100 %    Gen: Elderly acutely ill female laying in bed, sleeping  Eyes: Sclera anicteric, EOMI grossly normal   HENT: Atraumatic, normocephalic  Neck: Trachea midline  Heart: RRR  Lungs: CTAB, no crackles or wheezes  Abdomen: Soft, NTND  Extremities: No edema  Neuro: Grossly symmetric, non-focal    Skin:  No rashes, lesions on clothed exam  Psych: Alert, oriented

## 2022-10-05 NOTE — Unmapped (Signed)
Late Entry: 1020: Pt called out with c/o SOB and not breathing good Pt noted to have labored breathing with c/o chest pressure. Wheezing noted and neb treatment given, Md team called to bed side. Pt repositioned sitting up in bed and after neb, put on 2 L [place], Md at bedside and labs ordered and sent. EKG completed, CXR and Rapid called with c/o pulmonary edema. Team discussed plan of care and pt to stay on this unit as acute awaiting admission bed. After about 1 hour, pt had more controlled breathing unlabored and resting. Pt has not had any BM this shift and remained afebrile. Consults to bedside for assessment.

## 2022-10-05 NOTE — Unmapped (Signed)
Patient's spouse called to report that the patient had a temperature of 101.5 this evening. She denied any nausea, vomiting, cough, congestion, SOB, UTI symptoms or any other infectious symptoms.  Her spouse stated her mental status was normal but she's agitated at times.     Patient does have history of UTI bacteremia with prolonged admission recently. Spoke with nephrology fellow on-call and she agreed, patient needed to go to [facility] ED for evaluation. Contacted patient's spouse back and explained the concerns and the need to have her evaluated in the ED. He verbalized understanding and said he would take her in tonight.    Contacted ED charge RN to notify ED of patient's arrival.

## 2022-10-05 NOTE — Unmapped (Signed)
[facility]  Emergency Department Provider Note     ED Clinical Impression     Final diagnoses:   Sepsis, due to unspecified organism, unspecified whether acute organ dysfunction present (CMS-HCC) (Primary)   Kidney transplant recipient   Immunocompromised (CMS-HCC)        Impression, Medical Decision Making, ED Course     Impression: 72 y.o. female who has a past medical history of Anemia, 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), Hypertension, Hypothyroidism (acquired), Kidney transplant status, cadaveric (08/08/2022), and Type 2 diabetes mellitus (CMS-HCC). who presents with fevers, nausea, vomiting and diarrhea as described below.  Patient had kidney transplant on 08/06/2022.  Shortly after that she had a urinary tract infection that required hospitalization.  She is currently immune suppressed on Prograft, mycophenolate and prednisone.  Patient has had 48 hours of fevers and 24 hours of nausea, vomiting and diarrhea.  On physical exam patient is well appearing, nontoxic, hemodynamically stable and febrile to 38 ??C.  Lungs are clear to auscultation bilaterally without wheezing or crackles.  On pulmonary exam she has a normal heart rate and regular rhythm 2+ pulses in all extremities.  Abdomen is soft, nontender, nondistended.    DDx/MDM: Differential diagnosis includes but is not limited to infection.  Patient has had urinary tract infections in the past and states this feels similar.  However due to patient's immunocompromised state will start broad-spectrum antibiotics, septic fluids.  Will page for admission.    Diagnostic workup as below. Will treat patient with cefepime, vancomycin,  IV hydration.    Chest x-ray negative for focal consolidation, pneumothorax or pleural effusion..  Bronchovascular cuffing which may reflect early pulmonary versus bronchiolitis.    CBC without elevated white count, thrombocytopenia with a platelet count of 62.  Hemoglobin of 7.8 which is below patient's baseline of 8-9.  CMP with mildly elevated potassium at 5.4, creatinine of 1.79, this appears grossly to be at patient's baseline.  VBG with metabolic acidosis, pH of 7.26, bicarb of 17, respiratory compensation.  Lactate elevated at 2.6.  Workup consistent and concerning for infection.  UA with large leukocyte esterase, white blood count of 26 and rare bacteria, questionable source of infection.    Orders Placed This Encounter   Procedures    Critical Care    Pump IV    Pole - Single    Blood Culture    Blood Culture    Urine Culture    Respiratory Pathogen Panel    Blood Culture #1    Blood Culture #2    GI Pathogen Panel    US Renal Transplant W Doppler    XR Chest Portable    XR Chest Portable    XR Abdomen 1 View    CT Abdomen Pelvis Wo Contrast    XR Chest Portable    CBC w/ Differential    Comprehensive Metabolic Panel    Lactate Sepsis, Venous    Urinalysis with Microscopy    Tacrolimus Level, Trough    Magnesium Level    Tacrolimus Level, Trough    Sodium, Random Urine    Creatinine, Urine    Basic metabolic panel    CBC    Magnesium Level    CMV DNA, quantitative, PCR    EBV QUANTITATIVE PCR, BLOOD    Vancomycin, Random    Comprehensive Metabolic Panel    Magnesium Level    Phosphorus Level    CBC w/ Differential    Blood Gas, Venous    Lactate, Venous, Whole  Blood    Tacrolimus Level, Trough    hsTroponin I (single, no delta)    CBC    Comprehensive Metabolic Panel    Blood Gas Critical Care Panel, Venous    Blood Gas Critical Care Panel, Arterial    Magnesium Level    Comprehensive Metabolic Panel    Tacrolimus Level, Trough    Nutrition Therapy Regular/House    Cardiac Monitor    Oxygen sat continuous monitoring    Notify Provider    Notify Provider    Misc nursing order (Sepsis Timer)    In and Out (I & O) cath    Vital signs    RN to notify pharmacy immediately that an antibiotic has been ordered from the Sepsis Order Set (if not available in the [employer] or if not yet verified)    Notify Provider    Patient may shower    Weigh patient    Incentive Spirometry    Flush line per protocol    Vital signs    Notify Provider    Place sequential compression device    Strict intake and output    Full Code    Inpatient consult to Infectious Diseases    Inpatient consult to Nephrology    Inpatient consult to Social Work    Consult to Interventional Radiology    ECG 12 Lead    ECG 12 Lead    ECG 12 Lead    Echocardiogram Follow Up/Limited Echo    Insert peripheral IV    Insert 2nd peripheral IV    Insert peripheral IV    Saline lock IV    Place Patient in Bed    ED Admit Decision    ED Admit Decision    Transfer patient    Change Patient Level of Care            Independent Interpretation of Studies: I have independently interpreted the following studies:  Chest x-ray without any focal consolidation, pneumothorax or pleural effusion.  EKG with rate of 76, normal sinus rhythm, normal axis, subtle ST depression in V6.    Discussion of Management With Other Providers or Support Staff: I discussed the management of this patient with the:  Dr. Gae Dry, admitting provider.    Considerations Regarding Disposition/Escalation of Care and Critical Care:  Patient with recent kidney transplant, on immunosuppression coming in with fever, nausea, vomiting and diarrhea.  Concern for serious infection in immunocompromised patient will require inpatient management.  ____________________________________________    The case was discussed with the attending physician, who is in agreement with the above assessment and plan.      History     Chief Complaint  Chief Complaint   Patient presents with    Fever       HPI   Julie [name] is a 72 y.o. female with past medical history as below who presents with 48 hours of fever, 24 hours of nausea, vomiting, diarrhea.  Patient is hard of hearing and was unable to use translator, patient's daughter helped with history.  Patient states that she started having fevers 2 days ago.  Then yesterday she started feeling nauseous and had multiple episodes of emesis and diarrhea.  States that she has had a hard time keeping down food.  Missed her 3 PM and 9 PM medications yesterday.  Denies headache, neck pain, chest pain, shortness of breath, cough, abdominal pain, recent falls or trauma.    Outside Historian(s): I have obtained additional history/collateral  from patient's daughter at bedside.    External Records Reviewed: I have reviewed kidney transplant note from 09/28/2022.    Past Medical History:   Diagnosis Date    Anemia     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)     since July 2017    Hypertension     Hypothyroidism (acquired)     Kidney transplant status, cadaveric 08/08/2022    Type 2 diabetes mellitus (CMS-HCC)        Past Surgical History:   Procedure Laterality Date    CATARACT EXTRACTION      HYSTERECTOMY      OOPHORECTOMY      PERITONEAL CATHETER INSERTION      PR LAP INSERTION TUNNELED INTRAPERITONEAL CATHETER N/A 09/27/2018    Procedure: LAPAROSCOPY, SURGICAL; WITH INSERTION OF INTRAPERITONEAL CANNULA OR CATHETER, PERMANENT;  Surgeon: Leona Carry, MD;  Location: MAIN OR [place];  Service: Transplant    PR LAP INSERTION TUNNELED INTRAPERITONEAL CATHETER N/A 04/08/2019    Procedure: LAPAROSCOPY, SURGICAL; WITH INSERTION OF INTRAPERITONEAL CANNULA OR CATHETER, PERMANENT;  Surgeon: Leona Carry, MD;  Location: MAIN OR [facility];  Service: Transplant    PR LAP REVISE INTRAPERITONEAL CATHETER N/A 08/26/2019    Procedure: LAPAROSCOPY, SURGICAL; W/REVIS PREV PLACED INTRAPERITONEAL CANNULA/CATH, REMOV INTRALUMIN OBSTRUCT MATERIAL;  Surgeon: Leona Carry, MD;  Location: MAIN OR [facility];  Service: Transplant    PR REMOVAL TUNNELED INTRAPERITONEAL CATHETER N/A 05/06/2018    Procedure: REMOVAL OF PERMANENT INTRAPERITONEAL CANNULA OR CATHETER;  Surgeon: Leona Carry, MD;  Location: MAIN OR [facility];  Service: Transplant    PR REMOVE PERITONEAL FOREIGN BODY N/A 04/08/2019    Procedure: Removal Of Peritoneal Of Foreign Body From Peritoneal Cavity;  Surgeon: Leona Carry, MD;  Location: MAIN OR [facility];  Service: Transplant    PR TRANSPLANT,PREP RENAL GRAFT/ARTERIAL Right 08/06/2022    Procedure: [facility] RECONSTRUCTION CADAVER/LIVING DONOR RENAL ALLOGRAFT PRIOR TO TRANSPLANT; ARTERIAL ANASTOMOSIS EAC;  Surgeon: Toledo, Lilyan Punt, MD;  Location: MAIN OR [facility];  Service: Transplant    PR TRANSPLANTATION OF KIDNEY Right 08/06/2022    Procedure: RENAL ALLOTRANSPLANTATION, IMPLANTATION OF GRAFT; WITHOUT RECIPIENT NEPHRECTOMY;  Surgeon: Toledo, Lilyan Punt, MD;  Location: MAIN OR [facility];  Service: Transplant    TUBAL LIGATION           Current Facility-Administered Medications:     acetaminophen (TYLENOL) tablet 650 mg, 650 mg, Oral, Q6H PRN, Gracelyn Nurse, MD, 650 mg at 10/06/22 2330    aluminum-magnesium hydroxide-simethicone (MAALOX MAX) 80-80-8 mg/mL oral suspension, 30 mL, Oral, Q4H PRN, Gracelyn Nurse, MD    [Provider Hold] amlodipine (NORVASC) tablet 10 mg, 10 mg, Oral, Daily, Gracelyn Nurse, MD, 10 mg at 10/05/22 0930    atorvastatin (LIPITOR) tablet 10 mg, 10 mg, Oral, Daily, Gracelyn Nurse, MD, 10 mg at 10/06/22 0845    cloNIDine HCL (CATAPRES) tablet 0.1 mg, 0.1 mg, Oral, TID, Valeta Harms, MD, 0.1 mg at 10/06/22 2205    dextrose (D10W) 10% bolus 125 mL, 12.5 g, Intravenous, Q10 Min PRN, Gracelyn Nurse, MD    glucagon injection 1 mg, 1 mg, Intramuscular, Once PRN, Gracelyn Nurse, MD    glucose chewable tablet 16 g, 16 g, Oral, Q10 Min PRN, Gracelyn Nurse, MD    guaiFENesin (ROBITUSSIN) oral syrup, 200 mg, Oral, Q4H PRN, Gracelyn Nurse, MD    heparin (porcine) 5,000 unit/mL injection 5,000 Units, 5,000 Units, Subcutaneous, Q8H SCH, Gracelyn Nurse, MD, 5,000 Units at 10/06/22  2203    hydrALAZINE (APRESOLINE) tablet 100 mg, 100 mg, Oral, Q8H SCH, Gracelyn Nurse, MD, 100 mg at 10/06/22 2331    insulin glargine (LANTUS) injection 6 Units, 6 Units, Subcutaneous, Nightly, Gracelyn Nurse, MD, 6 Units at 10/06/22 2204    insulin lispro (HumaLOG) injection 0-20 Units, 0-20 Units, Subcutaneous, ACHS, Gracelyn Nurse, MD, 1 Units at 10/06/22 2204    levothyroxine (SYNTHROID) tablet 112 mcg, 112 mcg, Oral, daily, Gracelyn Nurse, MD, 112 mcg at 10/06/22 0530    magnesium oxide-Mg AA chelate (Magnesium Plus Protein) 2 tablet, 2 tablet, Oral, BID, Daine Floras, MD, 2 tablet at 10/06/22 2215    melatonin tablet 3 mg, 3 mg, Oral, Nightly PRN, Gracelyn Nurse, MD    meropenem (MERREM) 1 g in sodium chloride 0.9 % (NS) 100 mL IVPB-MBP, 1 g, Intravenous, Q12H, Valeta Harms, MD, Stopped at 10/06/22 2245    ondansetron (ZOFRAN-ODT) disintegrating tablet 4 mg, 4 mg, Oral, Q8H PRN **OR** ondansetron (ZOFRAN) injection 4 mg, 4 mg, Intravenous, Q8H PRN, Gracelyn Nurse, MD    pantoprazole (Protonix) EC tablet 40 mg, 40 mg, Oral, Daily, Gracelyn Nurse, MD, 40 mg at 10/06/22 0845    predniSONE (DELTASONE) tablet 5 mg, 5 mg, Oral, Daily, Gracelyn Nurse, MD, 5 mg at 10/06/22 0845    sodium bicarbonate tablet 650 mg, 650 mg, Oral, BID, Daine Floras, MD, 650 mg at 10/06/22 2203    sulfamethoxazole-trimethoprim (BACTRIM) 400-80 mg tablet 80 mg of trimethoprim, 1 tablet, Oral, Once per day on Monday Wednesday Friday, Gracelyn Nurse, MD, 80 mg of trimethoprim at 10/06/22 0845    tacrolimus (PROGRAF) capsule 2 mg, 2 mg, Oral, Nightly, Valeta Harms, MD, 2 mg at 10/06/22 2202    tacrolimus (PROGRAF) capsule 3 mg, 3 mg, Oral, Daily, Valeta Harms, MD    valGANciclovir (VALCYTE) tablet 450 mg, 450 mg, Oral, Every other day, Gracelyn Nurse, MD, 450 mg at 10/05/22 0930    Allergies  Oxycodone, Penicillins, and Valacyclovir    Family History  Family History   Problem Relation Age of Onset    Diabetes Mother     Alzheimer's disease Father     Diabetes Sister     Diabetes Sister     Diabetes Sister Anesthesia problems Neg Hx     Bleeding Disorder Neg Hx        Social History  Social History     Tobacco Use    Smoking status: Former     Current packs/day: 0.00     Average packs/day: 0.8 packs/day for 30.0 years (22.5 ttl pk-yrs)     Types: Cigarettes     Start date: 06/06/1976     Quit date: 06/06/2006     Years since quitting: 16.3    Smokeless tobacco: Former   Substance Use Topics    Alcohol use: Yes     Comment: only special occasions    Drug use: No        Physical Exam     VITAL SIGNS:      Vitals:    10/06/22 1200 10/06/22 1617 10/06/22 1919 10/06/22 2313   BP:  156/32 182/33 195/35   Pulse: 65 70 73 82   Resp: 19 16 21 27    Temp:  37 ??C (98.6 ??F) 37.3 ??C (99.2 ??F) 37.7 ??C (99.8 ??F)   TempSrc:  Oral Oral Oral   SpO2: 96% 96% 95% 95%   Weight:  Height:           Constitutional: Alert and oriented. No acute distress.  Eyes: Conjunctivae are normal.  HEENT: Normocephalic and atraumatic. Conjunctivae clear. No congestion. Moist mucous membranes.   Cardiovascular: Rate as above, regular rhythm. Normal and symmetric distal pulses. Brisk capillary refill. Normal skin turgor.  Respiratory: Normal respiratory effort. Breath sounds are normal. There are no wheezing or crackles heard.  Gastrointestinal: Soft, non-distended, non-tender.  Genitourinary: Deferred.  Musculoskeletal: Non-tender with normal range of motion in all extremities.  Neurologic: Normal speech and language. No gross focal neurologic deficits are appreciated. Patient is moving all extremities equally, face is symmetric at rest and with speech.  Skin: Skin is warm, dry and intact. No rash noted.  Psychiatric: Mood and affect are normal. Speech and behavior are normal.     Radiology     XR Chest Portable   Final Result      Slightly improved interstitial pulmonary edema.      CT Abdomen Pelvis Wo Contrast   Final Result   Inflammatory changes of the left lower quadrant transplant kidney concerning for renal infection, possibly pyelonephritis but evaluation is limited on this noncontrast exam.      The transplant perinephric collection noted on recent ultrasound is not well delineated in the absence of intravenous contrast.   ====================   ADDENDUM (10/06/2022 9:38 AM):    On review, the following additional findings were noted:         1. Agree there is enlargement of the transplant kidney within the left lower quadrant with moderate perinephric stranding. Additionally, along the posterior mid and inferior aspect of the renal transplant is a fluid collection measuring approximately 5.3 x 2.0 cm. Subcapsular versus perinephric fluid collection is limitedly assessed without contrast. Diagnostic considerations in the early post transplant state would include hematoma, seroma or possibly urinoma. There is no gas collection. However infection remains in the differential.      XR Abdomen 1 View   Final Result   Nonspecific, nonobstructive bowel gas pattern.         XR Chest Portable   Final Result   Worsening interstitial pulmonary edema.      US Renal Transplant W Doppler   Final Result   Small new perinephric collection with low-level internal echoes, possibly small hematoma, complicated seroma, or less likely abscess.      Stable resistive indices in the renal transplant arteries, which are mildly elevated. Previously identified subtle tardus parvus pattern is not well visualized on the current examination. Similar mildly elevated main renal artery velocity with 3 fold steup up velocity compared to the iliac artery, not specific in the absence of tardus parvus pattern in the segmental arteries. Clinical correlation is recommended.      ====================   MODIFIED REPORT:   (10/05/2022 10:26 AM)   This report has been modified from its preliminary version; you may check the prior versions of radiology report, results history link for prior report versions (if they were previously visible in Epic). -----------------------------------------------         Please see below for data measurements:      Transplant location: [facility]       Renal Transplant: Sagittal 12.4 cm; AP 5.5 cm; Transverse 5.2 cm      Segmental artery superior resistive index: 0.84 (previously 0.79)   Segmental artery mid resistive index: 0.87 (0.84)   Segmental artery inferior resistive index: 0.77 (0.81)      Previous resistive indices range  of segmental arteries: 0.79-0.84      Main renal artery peak systolic velocity at anastomosis: 2.4 m/s, previously 2.2 m/s   Main renal artery hilum resistive index: 0.91 (0.88)   Main renal artery mid resistive index: 0.91 (0.93)   Main renal artery anastomosis resistive index: 0.92 (0.91)      Previous resistive indices range of main renal artery: 0.88-0.93      Main renal vein: patent      Iliac artery: Patent   Iliac vein: Patent      Bladder volume prevoid: 143.03  mL   Bladder volume postvoid: mL               XR Chest Portable   Final Result      No acute airspace disease.   Peribronchovascular cuffing which may reflect early pulmonary edema versus bronchiolitis      ====================   MODIFIED REPORT:    (10/05/2022 8:10 AM)   This report has been modified from its preliminary version; you may check the prior versions of radiology report, results history link for prior report versions.       -----------------------------------------------      Echocardiogram Follow Up/Limited Echo    (Results Pending)       Pertinent labs & imaging results that were available during my care of the patient were independently interpreted by me and considered in my medical decision making (see chart for details).    Portions of this record have been created using Scientist, clinical (histocompatibility and immunogenetics). Dictation errors have been sought, but may not have been identified and corrected.         Lucretia Roers, MD  Resident  10/07/22 [phone]

## 2022-10-05 NOTE — Unmapped (Signed)
Pt presented to the ED from home with c/o fever and vomiting. Pt is a post kidney transplant. Pt had a fever today of 102F and was told by her Mds to be evaluated in the ED

## 2022-10-05 NOTE — Unmapped (Signed)
Transplant Nephrology Consult     Requesting provider: Era Skeen  Service requesting consult: MDW  Reason for consult: AKI, transplant      Assessment/Recommendations: Julie [name] is a/an 72 y.o. 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    #Status post kidney transplant, Allograft Function (unstable), AKI:   -Serum creatinine level is 1.79 (improved from 2.05), unclear baseline but Cr nadir at 1.44 on 3/12.  -AKI likely due to volume depletion from N/V/D  -Renal US 3/14 showed small new perinephric collection with low-level internal echoes, possibly small hematoma, complicated seroma. Could be abscess but no graft tenderness on exam which is reassuring.  -DSAs not present 09/06/22   -Monitor Cr, renally dose meds, strict I/Os  -Maintain MAP > 65 for optimal renal perfusion  -Avoid nephrotoxins including NSAIDs, IV contrast unless absolutely necessary  -No indication for dialysis at this time    #Immunosuppression [High risk medical decision making for drug therapy requiring intensive monitoring for toxicity]  -Home regimen: tacrolimus (Prograf) 2 mg BID, MMF 500 mg BID, prednisone 5 mg daily  -Please decrease MMF to 250 mg BID in setting of diarrhea  -Please obtain trough tacrolimus levels prior to the morning dose of the medication. The tacrolimus goal level is 7-9 ng/mL.    #BP Management:  -Home regimen: amlodipine 10 mg daily, carvedilol 25 mg BID, clonidine 0.1 mg TID, hydralazine 100 mg TID, chorthalidone 25 mg daily, spironolactone 25 mg daily    #Infectious Prophylaxis and Monitoring:   CMV D+/R+, EBV D+/R+  CMV ppx:  Valcyte x3 mo  PJP ppx: Bactrim x6 mo    #Fever, N/V/D  -Agree with broad infectious work-up and empiric abx treatment  -Per primary team    Discussed recommendations with primary team.     Forest Becker, MD  Division of Nephrology and Hypertension  [facility]  10/05/2022      _____________________________________________________________________________________    Kidney Transplant History:   Date of Transplant: 08/06/2022 (Kidney)  Type of Transplant: DCD  KDPI: 56%  Cold ischemic time: 877 minutes (14 hr 37 min)  Warm ischemic time: 32 minutes  cPRA: 0%  HLA match:   Blood type: Donor A1, Recipient A POS  ID: CMV D+/R+, EBV D+/R+  Native Kidney Disease: Diabetes              Native kidney biopsy: Diabetic nephropathy (done elsewhere)              Pre-transplant dialysis course: PD started July 2017, transitioned to HD October 2019 due to catheter dysfunction/inadequate dialysis  Prior Transplants: None  Induction: Basiliximab  Early steroid withdrawal: No    History of Present Illness: Julie [name] is a/an 72 y.o. 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 Patient is slightly a difficult historian. No family members at bedside.     States she has felt chilly. Not sure if she's had fevers. Some diarrhea, not sure how much though. Per primary team note, sounds like she has had symptoms of sujective fevers, emesis and diarrhea for 48 hours. Poor PO intake.     No SOB or chest pain. No abdominal pain or graft pain.       Medications:   Current Facility-Administered Medications   Medication Dose Route Frequency  Provider Last Rate Last Admin    acetaminophen (TYLENOL) tablet 650 mg  650 mg Oral Q6H PRN Gracelyn Nurse, MD        aluminum-magnesium hydroxide-simethicone (MAALOX MAX) 80-80-8 mg/mL oral suspension  30 mL Oral Q4H PRN Gracelyn Nurse, MD        amlodipine (NORVASC) tablet 10 mg  10 mg Oral Daily Gracelyn Nurse, MD   10 mg at 10/05/22 0930    atorvastatin (LIPITOR) tablet 10 mg  10 mg Oral Daily Gracelyn Nurse, MD   10 mg at 10/05/22 0930    cefepime (MAXIPIME) 1 g in sodium chloride 0.9 % (NS) 100 mL IVPB-MBP 1 g Intravenous Q12H SCH Daine Floras, MD        dextrose (D10W) 10% bolus 125 mL  12.5 g Intravenous Q10 Min PRN Gracelyn Nurse, MD        glucagon injection 1 mg  1 mg Intramuscular Once PRN Gracelyn Nurse, MD        glucose chewable tablet 16 g  16 g Oral Q10 Min PRN Gracelyn Nurse, MD        guaiFENesin (ROBITUSSIN) oral syrup  200 mg Oral Q4H PRN Gracelyn Nurse, MD        heparin (porcine) 5,000 unit/mL injection 5,000 Units  5,000 Units Subcutaneous [facility] Coletti, Lilli Light, MD        hydrALAZINE (APRESOLINE) tablet 100 mg  100 mg Oral [facility] Gracelyn Nurse, MD   100 mg at 10/05/22 0930    insulin glargine (LANTUS) injection 6 Units  6 Units Subcutaneous Nightly Gracelyn Nurse, MD        insulin lispro (HumaLOG) injection 0-20 Units  0-20 Units Subcutaneous ACHS Gracelyn Nurse, MD        levothyroxine (SYNTHROID) tablet 112 mcg  112 mcg Oral daily Gracelyn Nurse, MD        magnesium sulfate 2gm/65mL IVPB  2 g Intravenous Once Yi, Arleta [place], MD        melatonin tablet 3 mg  3 mg Oral Nightly PRN Gracelyn Nurse, MD        metroNIDAZOLE (FLAGYL) tablet 500 mg  500 mg Oral TID Daine Floras, MD   500 mg at 10/05/22 0930    ondansetron (ZOFRAN-ODT) disintegrating tablet 4 mg  4 mg Oral Q8H PRN Daine Floras, MD        Or    ondansetron (ZOFRAN) injection 4 mg  4 mg Intravenous Q8H PRN Daine Floras, MD        pantoprazole (Protonix) EC tablet 40 mg  40 mg Oral Daily Gracelyn Nurse, MD   40 mg at 10/05/22 0930    predniSONE (DELTASONE) tablet 5 mg  5 mg Oral Daily Gracelyn Nurse, MD   5 mg at 10/05/22 0930    [START ON 10/06/2022] sulfamethoxazole-trimethoprim (BACTRIM) 400-80 mg tablet 80 mg of trimethoprim  1 tablet Oral Once per day on Monday Wednesday Friday Gracelyn Nurse, MD        tacrolimus (PROGRAF) capsule 2 mg  2 mg Oral BID Gracelyn Nurse, MD   2 mg at 10/05/22 0930    valGANciclovir (VALCYTE) tablet 450 mg  450 mg Oral Every other day Gracelyn Nurse, MD   450 mg at 10/05/22 0930     Current Outpatient Medications   Medication Sig Dispense Refill    acetaminophen (TYLENOL) 500 MG tablet Take  1-2 tablets (500-1,000 mg total) by mouth every eight (8) hours as needed for pain or fever. 60 tablet 11    amlodipine (NORVASC) 10 MG tablet Take 1 tablet (10 mg total) by mouth daily. 90 tablet 3    atorvastatin (LIPITOR) 10 MG tablet Take 1 tablet (10 mg total) by mouth daily. 90 tablet 3    blood sugar diagnostic (ACCU-CHEK AVIVA PLUS TEST STRP) Strp USE 1 STRIP TO CHECK GLUCOSE THREE TIMES DAILY      blood sugar diagnostic (GLUCOSE BLOOD) Strp Use to check blood sugar as directed with insulin 3 times a day & for symptoms of high or low blood sugar. 100 strip 11    blood sugar diagnostic Strp USE 1 STRIP TO CHECK GLUCOSE THREE TIMES DAILY      blood-glucose meter kit Use as directed. ACCU-CHEK AVIVA      blood-glucose meter kit Use as instructed 1 each 0    blood-glucose sensor (DEXCOM G7 SENSOR) Devi Use 1 sensor every 10 days. 3 each 11    calcium carbonate (TUMS) 200 mg calcium (500 mg) chewable tablet Chew 2 tablets (400 mg of elem calcium total) Three (3) times a day. 540 tablet 3    carvedilol (COREG) 25 MG tablet Take 1 tablet (25 mg total) by mouth two (2) times a day. 180 tablet 3    chlorthalidone (HYGROTON) 25 MG tablet Take 1 tablet (25 mg total) by mouth daily. 90 tablet 3    cloNIDine (CATAPRES-TTS) 0.3 mg/24 hr Place 1 patch on the skin once a week. 4 patch 0    cloNIDine (CATAPRES-TTS) 0.3 mg/24 hr Place 1 patch on the skin once a week. 4 patch 11    docusate sodium (COLACE) 100 MG capsule Take 1 capsule (100 mg total) by mouth two (2) times a day as needed for constipation. 60 capsule 11    hydrALAZINE (APRESOLINE) 100 MG tablet Take 1 tablet (100 mg total) by mouth every eight (8) hours. 270 tablet 3    insulin aspart (NOVOLOG FLEXPEN U-100 INSULIN) 100 unit/mL (3 mL) injection pen Inject 0.03 mL (3 Units total) under the skin Three (3) times a day before meals AND 0-0.08 mL (0-8 Units total) Three (3) times a day before meals. Max 33 units per day. 29.7 mL 3    insulin glargine (BASAGLAR, LANTUS) 100 unit/mL (3 mL) injection pen Inject 0.05-0.1 mL (5-10 Units total) under the skin nightly. 9 mL 3    lancets Misc Use to check blood sugar as directed with insulin 3 times a day & for symptoms of high or low blood sugar. 100 each 0    levothyroxine (SYNTHROID) 112 MCG tablet Take 1 tablet (112 mcg total) by mouth daily. 90 tablet 3    magnesium oxide-Mg AA chelate (MAGNESIUM, AMINO ACID CHELATE,) 133 mg Take 1 tablet by mouth two (2) times a day. Hold until directed to take by nurse coordinator 60 tablet 11    mycophenolate (CELLCEPT) 250 mg capsule Take 2 capsules (500 mg total) by mouth two (2) times a day. 360 capsule 3    pantoprazole (PROTONIX) 40 MG tablet Take 1 tablet (40 mg total) by mouth daily. 90 tablet 3    pen needle, diabetic 32 gauge x 5/32 (4 mm) Ndle Use with insulin up to 4 times/day as needed. 100 each 11    pen needle, diabetic 32 gauge x 5/32 Ndle Use with insulin 2 times daily      polyethylene glycol (GLYCOLAX)  17 gram/dose powder Take 17 g by mouth daily as needed. 510 g 11    predniSONE (DELTASONE) 5 MG tablet Take 1 tablet (5 mg total) by mouth in the morning. 90 tablet 3    SENNA 8.6 mg tablet Take 1 tablet by mouth daily as needed for constipation. 30 tablet 11    spironolactone (ALDACTONE) 25 MG tablet Take 1 tablet (25 mg total) by mouth daily. 90 tablet 3    sulfamethoxazole-trimethoprim (BACTRIM) 400-80 mg per tablet Take 1 tablet (80 mg of trimethoprim total) by mouth Every Monday, Wednesday, and Friday. 36 tablet 1    tacrolimus (PROGRAF) 1 MG capsule Take 2 capsules (2 mg total) by mouth daily AND 1 capsule (1 mg total) nightly. (Patient taking differently: Take 2 capsules (2 mg total) by mouth daily AND 2 capsule (1 mg total) nightly.) 270 capsule 3    valGANciclovir (VALCYTE) 450 mg tablet Take 1 tablet (450 mg total) by mouth every other day. 45 tablet 0    aspirin (ECOTRIN) 81 MG tablet Take 1 tablet (81 mg total) by mouth daily. 90 tablet 3        ALLERGIES  Penicillins, Oxycodone, and Valacyclovir    MEDICAL HISTORY  Past Medical History:   Diagnosis Date    Anemia     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)     since July 2017    Hypertension     Hypothyroidism (acquired)     Kidney transplant status, cadaveric 08/08/2022    Type 2 diabetes mellitus (CMS-HCC)         SOCIAL HISTORY  Social History     Socioeconomic History    Marital status: Married     Spouse name: Estanislada Larocca    Number of children: 2   Occupational History    Occupation: retired   Tobacco Use    Smoking status: Former     Current packs/day: 0.00     Average packs/day: 0.8 packs/day for 30.0 years (22.5 ttl pk-yrs)     Types: Cigarettes     Start date: 06/06/1976     Quit date: 06/06/2006     Years since quitting: 16.3    Smokeless tobacco: Former   Substance and Sexual Activity    Alcohol use: Yes     Comment: only special occasions    Drug use: No   Social History Narrative    Lives in [place] with her husband. Moved from [place] to the [place] when she was around 19.     Social Determinants of Health     Financial Resource Strain: Low Risk  (04/06/2018)    Received from [facility]    Overall Financial Resource Strain (CARDIA)     Difficulty of Paying Living Expenses: Not hard at all   Food Insecurity: No Food Insecurity (02/28/2022)    Received from [facility]    Hunger Vital Sign     Worried About Running Out of Food in the Last Year: Never true     Ran Out of Food in the Last Year: Never true   Transportation Needs: No Transportation Needs (02/28/2022)    Received from [facility] - Transportation     Lack of Transportation (Medical): No     Lack of Transportation (Non-Medical): No   Social Connections: Unknown (04/06/2018)    Received from [facility]    Social Connection and Isolation Panel [NHANES]     Marital Status: Married  FAMILY HISTORY  Family History   Problem Relation Age of Onset    Diabetes Mother     Alzheimer's disease Father     Diabetes Sister     Diabetes Sister     Diabetes Sister     Anesthesia problems Neg Hx     Bleeding Disorder Neg Hx         No family history of kidney disease    Review of Systems:  A 12 system review of systems was negative except as noted in HPI.  Otherwise as per HPI, all other systems reviewed and negative    Physical Exam:  Vitals:    10/05/22 1130   BP: 126/91   Pulse: 88   Resp: (!) 31   Temp:    SpO2: 96%     No intake/output data recorded.  No intake or output data in the 24 hours ending 10/05/22 1152    General: fatigued, no acute distress  HEENT: anicteric sclera, oropharynx clear without lesions  CV: RRR  Lungs: Slightly increased work of breathing, on room air  Abdomen: soft, non-tender, non-distended, no tenderness at graft site  Skin: no visible lesions or rashes  Psych: alert, engaged, appropriate mood and affect  Musculoskeletal: no obvious deformities  Neuro: normal speech, no gross focal deficits     Test Results  Reviewed  Lab Results   Component Value Date    NA 138 10/05/2022    K 5.2 (H) 10/05/2022    CL 112 (H) 10/05/2022    CO2 19.0 (L) 10/05/2022    BUN 55 (H) 10/05/2022    CREATININE 2.05 (H) 10/05/2022    GLU 96 10/05/2022    CALCIUM 8.6 (L) 10/05/2022    ALBUMIN 3.4 10/05/2022    PHOS 3.0 09/28/2022           I have reviewed all relevant outside healthcare records related to the patient's kidney injury.

## 2022-10-05 NOTE — Unmapped (Signed)
IMMUNOCOMPROMISED HOST INFECTIOUS DISEASE CONSULT NOTE    Julie [name] is being seen in consultation at the request of [name], MD for evaluation of UTI in transplant patient.    Assessment/Recommendations:    Julie [name] is a 72 y.o. 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     Summary of pertinent prior infections:  #History of Shingles 2017  #04/2018 Dialysis fluid [place] R: Ampicillin, Unasyn, Cefazolin,S: Ceftriaxone, Gentamicin, Levo, PipTazo, Tobra  #Donor urine cx with C. Dubliensis 08/05/22, <10K, negative recipient samples, negative donor blood cx, treated with 2 weeks PO fluconazole  #E. Coli bacteremia and UTI 08/21/22  - Ucx/Bcx 1/29 E. Coli, R amp, amp/sulb, TMP/SMX. Susc CFZ, cipro/levo, gent/tobra, nitrofurantoin, tetra  - Treated with vanc/CFP -> 3 weeks levofloxacin through 2/26 (ureteral stent removed 2/15)     Active infection:  # GNR bacteremia 10/04/22 - concern for urinary source   # Perinephric fluid collection 10/05/22  - 3/12 developed fever and malaise at home, per family possible poor PO intake, diarrhea as well  - 3/14 UA 26 WBC, Ucx not collected  - 3/14 Renal US 8.1 x 0.8 x 2.3 cm perinephric collection new from prior, considering small hematoma, complicated seroma, or less likely abscess  - 3/14 Bcx 2/2 GNR, pending speciation  Rx: Vanc/CFP/Flagyl 3/14 -> CFP     #Positive hepB core antibody, c/w prior infection, 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 Awaiting speciation of GNR to see if same E. Coli as prior UTI/bacteremia. Was treated with over 3 weeks levofloxacin for prior infection, and continued treatment through ureteral stent removal, which is reassuring    Diagnosis  Follow-up Bcx speciation  Please obtain Urine cx - if able add on to prior urinalysis to reduce time before antibiotic administration  Follow-up CMV PCR, EBV PCR, C. Diff, GIPP (if diarrhea seen)  Please obtain CT A/P, with contrast if able, to evaluate for intra-abdominal source or complicated urinary source (such as stone, fluid collection) of infection  Would preferably pursue sampling of perinephric fluid collection given new from prior study and pt with 2 episodes of UTI with bacteremia, though can await CT imaging    Management  Continue cefepime, renally dosed equivalent of 2g q8h  STOP vancomycin, metronidazole  If patient clinically decompensates, can broaden to meropenem    Antimicrobial prophylaxis  required for host deficiency: transplant immunosuppression  Viral: Valganciclovir  PJP: TMP/SMX MWF    Intensive toxicity monitoring for prescription antimicrobials   CBC w/diff at least once per week  BMP at least once per week  clinical assessments for rashes or other skin changes    The [facility] ID service will continue to follow.           Please page the ID Transplant/Liquid Oncology Fellow consult at [phone] with questions.  Patient discussed with Dr. [name].    [name], MD  [facility] Division of Infectious Diseases    History of Present Illness:      External record(s): Primary team note: rapid called this AM for shortness of breath .    Independent historian(s): no independent historian required.       35F with hx recent DDKT 08/06/22 c/b DGF, recent E. Coli UTI/Bacteremia 08/21/22, who presents with 2 days of fever, malaise, and diarrhea. History is limited as patient was somnolent during interview and no family was present. Per chart review and pt, 2 days ago pt started feeling chilly, noted fever up to 101.3 at home per husband, also noted poor PO intake and diarrhea. Pt denies further symptoms, including abdominal pain, dysuria, pain over kidney, though admits she feels like she has another UTI. Admitted to hospital 3/14 for further evaluation, on AM 3/14 after receiving fluids and IV magnesium developed acute shortness of breath and rapid response was called, CXR with interstitial edema pattern. Bcx from admission 2/2 with GNR. [place] of transplant kidney with thin perinephric fluid collection. Prior hospital stay 1/29 - 2/5 for E. Coli bacteremia 2/2 UTI, treated with CFP -> levofloxacin, repeat Bcx negative. Continued therapy through 09/18/22, ureteral stents were removed 09/07/22 with urology.    Allergies:  Allergies   Allergen Reactions    Penicillins Other (See Comments)     patient denies PCN allergy and states she had formal allergy testing that ruled out PCN allergy. 09/27/18    Prior allergy comment states hypotension and rash with PCN, patient does not recall this    Oxycodone Nausea And Vomiting     Per Pt cannot tolerate oxycodone    Valacyclovir Nausea Only and Other (See Comments)       Medications:   Antimicrobials:  Anti-infectives (From admission, onward)      Start     Dose/Rate Route Frequency Ordered Stop    10/06/22 0900  sulfamethoxazole-trimethoprim (BACTRIM) 400-80 mg tablet 80 mg of trimethoprim         1 tablet Oral Once per day on Monday Wednesday Friday 10/05/22 0605 01/15/23 0859    10/05/22 1600  cefepime (MAXIPIME) 1 g in sodium chloride 0.9 % (NS) 100 mL IVPB-MBP         1 g  200 mL/hr over 30 Minutes Intravenous Every 12 hours scheduled 10/05/22 0900      10/05/22 0900  valGANciclovir (VALCYTE) tablet 450 mg         450 mg Oral Every other day 10/05/22 0605      10/05/22 0900  metroNIDAZOLE (FLAGYL) tablet 500 mg         500 mg Oral 3 times a day (standard) 10/05/22 0859 10/12/22 0859            Current/Prior immunomodulators per problem list. No change since admission.    Other medications reviewed.     Past Medical History:   Diagnosis Date    Anemia     ESRD  on peritoneal dialysis (CMS-HCC)     since July 2017    Hypertension     Hypothyroidism (acquired)     Kidney transplant status, cadaveric 08/08/2022    Type 2 diabetes mellitus (CMS-HCC)      No additional immunocompromising condition except as above.    Past Surgical History:   Procedure Laterality Date    CATARACT EXTRACTION      HYSTERECTOMY      OOPHORECTOMY      PERITONEAL CATHETER INSERTION      PR LAP INSERTION TUNNELED INTRAPERITONEAL CATHETER N/A 09/27/2018    Procedure: LAPAROSCOPY, SURGICAL; WITH INSERTION OF INTRAPERITONEAL CANNULA OR CATHETER, PERMANENT;  Surgeon: Leona Carry, MD;  Location: MAIN OR [place];  Service: Transplant    PR LAP INSERTION TUNNELED INTRAPERITONEAL CATHETER N/A 04/08/2019    Procedure: LAPAROSCOPY, SURGICAL; WITH INSERTION OF INTRAPERITONEAL CANNULA OR CATHETER, PERMANENT;  Surgeon: Leona Carry, MD;  Location: MAIN OR [facility];  Service: Transplant    PR LAP REVISE INTRAPERITONEAL CATHETER N/A 08/26/2019    Procedure: LAPAROSCOPY, SURGICAL; W/REVIS PREV PLACED INTRAPERITONEAL CANNULA/CATH, REMOV INTRALUMIN OBSTRUCT MATERIAL;  Surgeon: Leona Carry, MD;  Location: MAIN OR [facility];  Service: Transplant    PR REMOVAL TUNNELED INTRAPERITONEAL CATHETER N/A 05/06/2018    Procedure: REMOVAL OF PERMANENT INTRAPERITONEAL CANNULA OR CATHETER;  Surgeon: Leona Carry, MD;  Location: MAIN OR [facility];  Service: Transplant    PR REMOVE PERITONEAL FOREIGN BODY N/A 04/08/2019    Procedure: Removal Of Peritoneal Of Foreign Body From Peritoneal Cavity;  Surgeon: Leona Carry, MD;  Location: MAIN OR [facility];  Service: Transplant    PR TRANSPLANT,PREP RENAL GRAFT/ARTERIAL Right 08/06/2022    Procedure: [facility] RECONSTRUCTION CADAVER/LIVING DONOR RENAL ALLOGRAFT PRIOR TO TRANSPLANT; ARTERIAL ANASTOMOSIS EAC;  Surgeon: Toledo, Lilyan Punt, MD;  Location: MAIN OR [facility];  Service: Transplant    PR TRANSPLANTATION OF KIDNEY Right 08/06/2022    Procedure: RENAL ALLOTRANSPLANTATION, IMPLANTATION OF GRAFT; WITHOUT RECIPIENT NEPHRECTOMY;  Surgeon: Toledo, Lilyan Punt, MD;  Location: MAIN OR [facility];  Service: Transplant    TUBAL LIGATION         Social History:  Tobacco use:   reports that she quit smoking about 16 years ago. Her smoking use included cigarettes. She started smoking about 46 years ago. She has a 22.5 pack-year smoking history. She has quit using smokeless tobacco.   Alcohol use:    reports current alcohol use.   Drug use:    reports no history of drug use.   Living situation:  Lives with spouse/partner   Residence:   [place]   Birth place  [place]   [place] travel:   No [place] travel outside of [place] recently   International travel:   Has traveled to [place]   Military service:  Has not served in the [employer]   Employment:  Unemployed   Pets and animal exposure:  No animal exposure   Insect exposure:  No tick exposure   Hobbies:  Denies unusual environmental exposures   TB exposures:  Prior negative TB testing   Sexual history:  Not sexually active   Other significant exposures:  No exposure to young children     Family History:  Family History   Problem Relation Age of Onset    Diabetes Mother     Alzheimer's disease Father     Diabetes Sister     Diabetes Sister     Diabetes Sister     Anesthesia problems Neg Hx  Bleeding Disorder Neg Hx             Vital Signs last 24 hours:  Temp:  [36.6 ??C (97.8 ??F)-38 ??C (100.4 ??F)] 36.6 ??C (97.8 ??F)  Heart Rate:  [68-90] 88  SpO2 Pulse:  [72-90] 86  Resp:  [15-36] 31  BP: (119-166)/(39-91) 126/91  MAP (mmHg):  [66-103] 103  SpO2:  [92 %-100 %] 96 %    Physical Exam:  Patient Lines/Drains/Airways Status       Active Active Lines, Drains, & Airways       Name Placement date Placement time Site Days    Peripheral IV 10/05/22 Anterior;Proximal;Right Forearm 10/05/22  0335  Forearm  less than 1    Arteriovenous Fistula - Vein Graft  Access 08/24/16 Left;Upper Arm 08/24/16  --  Arm  2233                  Const [x]  vital signs above    []  NAD, non-toxic appearance []  Chronically ill-appearing, non-distressed  Lying in bed, somnolent but easily arouses to voice      Eyes [x]  Lids normal bilaterally, conjunctiva anicteric and noninjected OU     [] PERRL  [] EOMI        ENMT [x]  Normal appearance of external nose and ears, no nasal discharge        [x]  MMM, no lesions on lips or gums []  No thrush, leukoplakia, oral lesions  []  Dentition good []  Edentulous []  Dental caries present  []  Hearing normal  []  TMs with good light reflexes bilaterally         Neck [x]  Neck of normal appearance and trachea midline        []  No thyromegaly, nodules, or tenderness   []  Full neck ROM  Supple      Lymph [x]  No LAD in neck     []  No LAD in supraclavicular area     []  No LAD in axillae   []  No LAD in epitrochlear chains     []  No LAD in inguinal areas        CV [x]  RRR            []  No peripheral edema     []  Pedal pulses intact   []  No abnormal heart sounds appreciated   []  Extremities WWP         Resp []  Normal WOB at rest    []  No breathlessness with speaking, no coughing  []  CTA anteriorly    []  CTA posteriorly    Slightly tachypneic when asleep, no issues when awake  Scattered crackles posteriorly      GI [x]  Normal inspection, NTND   []  NABS     []  No umbilical hernia on exam       []  No hepatosplenomegaly     []  Inspection of perineal and perianal areas normal  No pain over transplant kidney, scars well healed      GU []  Normal external genitalia     [] No urinary catheter present in urethra   []  No CVA tenderness    []  No tenderness over renal allograft        MSK []  No clubbing or cyanosis of hands       []  No vertebral point tenderness  []  No focal tenderness or abnormalities on palpation of joints in RUE, LUE, RLE, or LLE        Skin [x]  No rashes, lesions, or ulcers of visualized skin     []   Skin warm and dry to palpation   LUE prior graft in place with thrill      Neuro [x]  Face expression symmetric  []  Sensation to light touch grossly intact throughout    [x]  Moves extremities equally    []  No tremor noted        []  CNs II-XII grossly intact     []  DTRs normal and symmetric throughout []  Gait unremarkable        Psych []  Appropriate affect       []  Fluent speech         []  Attentive, good eye contact  []  Oriented to person, place, time          []  Judgment and insight are appropriate   Awakens to voice, speech slightly slow/confused        Data for Medical Decision Making     (3/14) EKG QTcF 438    I discussed mgm't w/qualified health care professional(s) involved in case: primary team regarding workup .    I reviewed CBC results (WBC 10.6, 95% PMNs, plt 64 and decreasing), chemistry results (Cr 2.05, above baseline), micro result(s) (RPP negative. Ucx not collected. Bcx 2/2 GNR, pending speciation), and radiology report(s) ([place] transplant kidney 8 x 1 x 2 cm fluid collection).    I independently visualized/interpreted plain film images (CXR, interstitial edema seen).       Recent Labs   Lab Units 10/05/22  1110 10/05/22  0645 10/05/22  0339   WBC 10*9/L 10.6  --  10.5   HEMOGLOBIN g/dL 7.8*  --  8.2*   PLATELET COUNT (1) 10*9/L 64*  --  72*   NEUTRO ABS 10*9/L 10.1*  --  9.7*   LYMPHO ABS 10*9/L 0.3*  --  0.4*   EOSINO ABS 10*9/L 0.0  --  0.0   SODIUM mmol/L  --   --  138   POTASSIUM mmol/L  --   --  5.2*   BUN mg/dL --   --  55*   CREATININE mg/dL  --   --  7.42*   GLUCOSE mg/dL  --   --  96   CALCIUM mg/dL  --   --  8.6*   MAGNESIUM mg/dL  --  1.1* 1.2*   BILIRUBIN TOTAL mg/dL  --   --  0.3   AST U/L  --   --  19   ALT U/L  --   --  20       Lab Results   Component Value Date    Tacrolimus, Trough 3.6 (L) 10/05/2022       Microbiology:  Microbiology Results (last day)       Procedure Component Value Date/Time Date/Time    C. Difficile Assay [[id number]]     Lab Status: No result Specimen: Stool      GI Pathogen Panel [[id number]]     Lab Status: No result Specimen: Stool      Respiratory Pathogen Panel [[id number]]  (Normal) Collected: 10/05/22 0420    Lab Status: Final result Specimen: Nasopharyngeal Swab Updated: 10/05/22 0510     Adenovirus Not Detected     Coronavirus HKU1 Not Detected     Coronavirus NL63 Not Detected     Coronavirus 229E Not Detected     Coronavirus OC43 PCR Not Detected     Metapneumovirus Not Detected     Rhinovirus/Enterovirus Not Detected     Influenza A Not Detected     Influenza B Not Detected  Parainfluenza 1 Not Detected     Parainfluenza 2 Not Detected     Parainfluenza 3 Not Detected     Parainfluenza 4 Not Detected     RSV Not Detected     Bordetella pertussis Not Detected     Comment: If B. pertussis/parapertussis infection is suspected, the Bordetella pertussis/parapertussis Qualitative PCR test should be ordered.        Bordetella parapertussis Not Detected     Chlamydophila (Chlamydia) pneumoniae Not Detected     Mycoplasma pneumoniae Not Detected     SARS-CoV-2 PCR Not Detected    Narrative:      This result was obtained using the FDA-cleared Bio[employer] Respiratory 2.1 Panel. Performance characteristics have been established and verified by the Clinical Molecular Microbiology Laboratory, [facility]. This assay does not distinguish between rhinovirus and enterovirus. Lower respiratory specimens will not be tested for Bordetella pertussis/parapertussis. For nasopharyngeal swabs, cross-reactivity may occur between B. pertussis and non-pertussis Bordetella species. All positive B. pertussis results will be automatically confirmed using our in-house PCR assay.    Blood Culture [[id number]] Collected: 10/05/22 0340    Lab Status: In process Specimen: Blood from 1 Peripheral Draw Updated: 10/05/22 0349    Blood Culture [[id number]] Collected: 10/05/22 0339    Lab Status: In process Specimen: Blood from 1 Peripheral Draw Updated: 10/05/22 0349    Urine Culture [[id number]]     Lab Status: No result Specimen: Urine from Clean Catch             Imaging:  ECG 12 Lead    Result Date: 10/05/2022  NORMAL SINUS RHYTHM ST & T WAVE ABNORMALITY, CONSIDER LATERAL ISCHEMIA ABNORMAL ECG WHEN COMPARED WITH ECG OF 05-Oct-2022 05:40, NO SIGNIFICANT CHANGE WAS FOUND    ECG 12 Lead    Result Date: 10/05/2022  NORMAL SINUS RHYTHM SEPTAL INFARCT  , AGE UNDETERMINED ST & T WAVE ABNORMALITY, CONSIDER LATERAL ISCHEMIA ABNORMAL ECG WHEN COMPARED WITH ECG OF 21-Aug-2022 12:02, SEPTAL INFARCT  IS NOW PRESENT    US Renal Transplant W Doppler    Result Date: 10/05/2022  EXAM: US RENAL TRANSPLANT [place] ACCESSION: [id number] UN     CLINICAL INDICATION: 72 years old with AKI on recent transplant     COMPARISON: 08/21/2022 renal transplant ultrasound.     TECHNIQUE:  Ultrasound views of the renal transplant were obtained using gray scale and color and spectral Doppler imaging. Views of the urinary bladder were obtained using gray scale and limited color Doppler imaging.     FINDINGS:     TRANSPLANTED KIDNEY: The renal transplant was located in the left lower quadrant. Normal size and echogenicity.  No solid masses or calculi. 8.1 x 0.8 x 2.3 cm perinephric collection with trace internal echoes, not definitively seen on prior. No hydronephrosis.     VESSELS: - Perfusion: Using power Doppler, normal perfusion was seen throughout the renal parenchyma. - Resistive indices in the renal transplant are stable compared with prior examination. - Main renal artery/iliac artery: Patent - Main renal vein/iliac vein: Patent     BLADDER: Unremarkable.         Small new perinephric collection with low-level internal echoes, possibly small hematoma, complicated seroma, or less likely abscess.     Stable resistive indices in the renal transplant arteries, which are mildly elevated. Previously identified subtle tardus parvus pattern is not well visualized on the current examination. Similar mildly elevated main renal artery velocity with 3 fold steup up velocity compared to the iliac artery,  not specific in the absence of tardus parvus pattern in the segmental arteries. Clinical correlation is recommended.     ==================== MODIFIED REPORT: (10/05/2022 10:26 AM) This report has been modified from its preliminary version; you may check the prior versions of radiology report, results history link for prior report versions (if they were previously visible in Epic).     -----------------------------------------------         Please see below for data measurements:     Transplant location: LLQ     Renal Transplant: Sagittal 12.4 cm; AP 5.5 cm; Transverse 5.2 cm     Segmental artery superior resistive index: 0.84 (previously 0.79) Segmental artery mid resistive index: 0.87 (0.84) Segmental artery inferior resistive index: 0.77 (0.81)     Previous resistive indices range of segmental arteries: 0.79-0.84     Main renal artery peak systolic velocity at anastomosis: 2.4 m/s, previously 2.2 m/s Main renal artery hilum resistive index: 0.91 (0.88) Main renal artery mid resistive index: 0.91 (0.93) Main renal artery anastomosis resistive index: 0.92 (0.91)     Previous resistive indices range of main renal artery: 0.88-0.93     Main renal vein: patent     Iliac artery: Patent Iliac vein: Patent     Bladder volume prevoid: 143.03  mL Bladder volume postvoid: mL                 XR Chest Portable    Result Date: 10/05/2022  EXAM: XR CHEST PORTABLE ACCESSION: [id number] UN     CLINICAL INDICATION: COUGH      TECHNIQUE: Single View AP Chest Radiograph.     COMPARISON: Chest radiograph 08/24/2022     FINDINGS:     Lungs are low in volume with bibasilar atelectasis. Peribronchovascular cuffing. No pleural effusion or pneumothorax.     Normal heart size and mediastinal contours. Aortic arch calcifications. Tortuous thoracic aorta. Left-sided vascular stents.             No acute airspace disease. Peribronchovascular cuffing which may reflect early pulmonary edema versus bronchiolitis     ==================== MODIFIED REPORT: (10/05/2022 8:10 AM) This report has been modified from its preliminary version; you may check the prior versions of radiology report, results history link for prior report versions.     -----------------------------------------------     Serologies:  Lab Results   Component Value Date    CMV IGG Positive (A) 08/06/2022    EBV VCA IgG Antibody Positive (A) 08/06/2022    HIV Antigen/Antibody Combo Nonreactive 08/06/2022    Hep A IgG Reactive (A) 06/06/2022    Hep B Surface Ag Nonreactive 08/06/2022    Hep B S Ab Reactive (A) 08/06/2022    Hep B Surf Ab Quant 265.82 (H) 08/06/2022    Hep B Core Total Ab Reactive (A) 08/06/2022    Hepatitis C Ab Nonreactive 08/06/2022    RPR Nonreactive 08/06/2022    HSV 1 IgG Positive (A) 08/06/2022    HSV 2 IgG Negative 08/06/2022    Varicella IgG Positive 08/06/2022    Rubella IgG Scr Positive 06/06/2022    Toxoplasma Gondii IgG Negative 06/06/2022    Quantiferon TB Gold Plus Interpretation Negative 06/06/2022    Quantiferon Mitogen Minus Nil 9.92 06/06/2022    Quantiferon Antigen 1 minus Nil 0.17 06/06/2022       Immunizations:  Immunization History   Administered Date(s) Administered    COVID-19 VAC,BIVALENT(40YR UP),PFIZER 09/04/2019, 09/25/2019    COVID-19 VAC,MRNA,TRIS(12Y UP)(PFIZER)(GRAY CAP) 09/04/2019, 09/25/2019, 11/27/2020    COVID-19  VACC,MRNA,(PFIZER)(PF) 09/04/2019, 09/25/2019, 04/29/2020    Covid-19 Vacc, Unspecified 09/04/2019, 09/04/2019, 09/25/2019, 09/25/2019    HEPATITIS B VACCINE ADULT,IM(ENERGIX B, RECOMBIVAX) 04/27/2017    Hepatitis B Vaccine, Unspecified Formulation 04/27/2017    INFLUENZA INJ MDCK PF, QUAD,(FLUCELVAX)(31MO AND UP EGG FREE) 06/29/2017    INFLUENZA QUAD HIGH DOSE 57YRS+(FLUZONE) 04/05/2016, 04/27/2017, 05/31/2018, 04/10/2019    Influenza Virus Vaccine, unspecified formulation 04/24/2015, 05/26/2015, 04/05/2016, 04/05/2016, 04/27/2017, 04/27/2017, 05/31/2018, 05/31/2018, 04/10/2019, 04/10/2019, 05/09/2019, 04/29/2020, 05/09/2021, 05/09/2021, 04/24/2022, 04/26/2022    PNEUMOCOCCAL POLYSACCHARIDE 23-VALENT 07/09/2015    PPD Test 12/19/2015, 10/24/2016, 11/26/2017, 03/08/2019, 01/26/2020, 12/22/2020, 01/04/2022    Pneumococcal Conjugate 13-Valent 07/31/2018, 08/02/2018    Pneumococcal vaccine, Unspecified Formulation 06/28/2016, 08/26/2021    Pneumococcal, Unspecified Formulation 08/26/2021    TdaP 09/25/2016

## 2022-10-05 NOTE — Unmapped (Signed)
Daughter states - pt c/o feeling cold since yesterday, fever of 102  @ 2130hrs, N/V/D started this afternoon.      Hx Kidney transplant 01/24

## 2022-10-05 NOTE — Unmapped (Signed)
Internal Medicine (MEDW) Treatment Plan    Assessment & Plan:   Julie [name] is a 72 y.o. female whose presentation is complicated by DDKT, HTN, hypothyroidism, T2DM that presented to [facility] with fevers, nausea and vomiting, diarrhea.     Active Problems  GNR bacteremia iso immunocompromised status - Fever, N/V/D  Patient presenting with 48-hour history of fevers, febrile in ED 100.4, 24-hour history of nausea/vomiting and diarrhea.  No leukocytosis on labs, though WBC is elevated from baseline (10.6 3/14 usually ~5-7) patient does have left shift with neutrophils of 9.7.  Unclear etiology most likely gastroenteritis given acute nausea vomiting diarrhea, could also be secondary to gram-negative bacteremia.  Lower suspicion of respiratory, urinary source given patient is asymptomatic.  Patient is immunocompromised and is on tacrolimus, CellCept, prednisone. Will start patient on broad-spectrum antibiotics and do full infectious workup. Chest XR in the ED showed bibasilar atelectasis and signs of early pulmonary edema versus bronchiolitis. Was given 1L LR in ED, patient later felt acute onset shortness of breath. Crackles heard on auscultation. Repeat chest XR 3/14 in setting of SOB showed worsening interstitial pulmonary edema. Bcx (+) GNR.   -ID consulted, appreciate assistance   -F/U UCx, CMV, EBV pending  -C diff, GIPP pending  - CTAP wo contrast to evaluate for possible source  -Dc vancomycin  -Continue cefepime  -Continue tacrolimus, s/p 2 tacrolimus trough, PTD  -Continue prednisone 5mg  daily  - Continue CellCept 250mg  BID (500mg  BID home dose)   -Continue bactrim for PJP prophylaxis  -Continue valganciclovir for CMV prophylaxis  -ICID following; appreciate recs    DDKT in 2024  DCD, KDPI 56%, Basiliximab induction, CMV +/+, EBV +/+, standard anatomy. Post kidney transplant course has been notable for: E coli UTI.  Renal function still pending, patient most likely has prerenal AKI given poor p.o. intake. Renal US 3/14 showed small new perinephric collection which may represent hematoma, complicated seroma, or abscess. Stable resistive indices in the renal transplant arteries, which are mildly elevated. UA 3/14 positive for leukocyte esterase and WBC.   -Immunosuppression/prophylaxis stated above  -Transplant nephrology consulted, appreciate assistance    Acute on Chronic Thrombocytopenia- anemia on CKD  Unclear baseline appears to be between 90-100K, on presentation 72 K. Hgb 7.8 on recheck from 8.2 at admission. No bleeding noted.  60 decreased platelet may have been reactive secondary to acute infection.  -Daily CBC    T2DM  Last A1C 5.6.  Home regimen Lantus 12 units nightly, lispro 3 units with meals, with sliding scale.  Will decrease insulin regimen given poor p.o. intake. Glucose 108-120.  -Decrease Lantus 6 units nightly  -SSI  -Hold mealtime insulin    HTN  Will continue hypertension medication since patient is slightly hypertensive, will hold any nephrotoxic agents  - Continue amlodipine 10 mg daily  - Continue hydralazine 100 mg TID  - Hold carvedilol 25 mg BID  -Hold clonidine 0.1 mg TID  -Hold chorthalidone 25 mg daily  -Hold spironolactone 25 mg daily    Chronic Problems  Hypothyroidism: Continue Synthroid      The patient's presentation is complicated by the following clinically significant conditions requiring additional evaluation and treatment: - Thrombocytopenia POA requiring further investigation or monitor  - Chronic kidney disease POA requiring further investigation, treatment, or monitoring   - Hypokalemia POA requiring further investigation, treatment, or monitoring  - Acidosis POA requiring further investigation, treatment, or monitoring     Issues Impacting Complexity of Management:  -Intensive monitoring of  drug toxicity from Vancomycin with scheduled BMP and/or Vancomycin levels and Cefepime with scheduled BMP    Checklist:  Diet: Regular Diet  DVT PPx: Heparin 5000units q8h  Code Status: Full Code  Dispo: Patient appropriate for Inpatient based on expectation of ongoing need for hospitalization greater than two midnights based on severity of presentation/services including IV abx    Team Contact Information:   Primary Team: Internal Medicine (MEDW)  Primary Resident: Quentin Cornwall, MD  Resident's Pager: [phone] (Gen MedW Intern - Alvester Morin)    Interval History:   No acute events overnight. This morning patient was somnolent but responds to questions. She endorsed episodes of vomiting and diarrhea overnight, though she does not know how many episodes. Later, patient complained of significant shortness of breath and epigastric pain. She also complained of feeling very cold. She denied further episodes of vomiting or diarrhea. A rapid response was called and patient improved with IV lasix (80mg ) and supplemental O2.     When speaking with her daughter, Julie [name], she reports that the patient has had two prior episodes similar to this which she believes is related to the patient receiving too much fluids.    All other systems were reviewed and are negative except as noted in the HPI    Objective:   Temp:  [36.6 ??C (97.8 ??F)-38 ??C (100.4 ??F)] 37.7 ??C (99.9 ??F)  Heart Rate:  [68-90] 86  SpO2 Pulse:  [72-90] 86  Resp:  [15-36] 23  BP: (119-166)/(39-91) 149/45  SpO2:  [92 %-100 %] 98 %    Initial physical exam:  Gen: NAD, somnolent, responsive to questions  HENT: atraumatic, normocephalic  Heart: RRR, harsh systolic murmur  Lungs: mild bilateral crackles at lung bases   Abdomen: soft, NTND  Extremities: No edema    Follow up physical exam:  Gen: in distress, responsive to questions  HENT: atraumatic, normocephalic  Heart: tachycardic, systolic murmur  Lungs: moderate bilateral crackles at lung bases, significant work of breathing  Abdomen: soft, NTND  Extremities: No edema        Mercer Pod, MS3    I attest that I have reviewed the medical student note and that the components of the history of the present illness, the physical exam, and the assessment and plan documented were performed by me or were performed in my presence by the student where I verified the documentation and performed (or re-performed) the exam and medical decision making. Quentin Cornwall, MD

## 2022-10-06 LAB — BLOOD GAS CRITICAL CARE PANEL, VENOUS
BASE EXCESS VENOUS: -7.7 — ABNORMAL LOW (ref -2.0–2.0)
CALCIUM IONIZED VENOUS (MG/DL): 4.71 mg/dL (ref 4.40–5.40)
GLUCOSE WHOLE BLOOD: 109 mg/dL (ref 70–179)
HCO3 VENOUS: 18 mmol/L — ABNORMAL LOW (ref 22–27)
HEMOGLOBIN BLOOD GAS: 8.7 g/dL — ABNORMAL LOW
LACTATE BLOOD VENOUS: 0.9 mmol/L (ref 0.5–1.8)
O2 SATURATION VENOUS: 63.7 % (ref 40.0–85.0)
PCO2 VENOUS: 29 mmHg — ABNORMAL LOW (ref 40–60)
PH VENOUS: 7.37 (ref 7.32–7.43)
PO2 VENOUS: 35 mmHg (ref 30–55)
POTASSIUM WHOLE BLOOD: 4.9 mmol/L — ABNORMAL HIGH (ref 3.4–4.6)
SODIUM WHOLE BLOOD: 136 mmol/L (ref 135–145)

## 2022-10-06 LAB — BLOOD GAS CRITICAL CARE PANEL, ARTERIAL
BASE EXCESS ARTERIAL: -8.9 — ABNORMAL LOW (ref -2.0–2.0)
CALCIUM IONIZED ARTERIAL (MG/DL): 4.87 mg/dL (ref 4.40–5.40)
GLUCOSE WHOLE BLOOD: 118 mg/dL (ref 70–179)
HCO3 ARTERIAL: 15 mmol/L — ABNORMAL LOW (ref 22–27)
HEMOGLOBIN BLOOD GAS: 8 g/dL — ABNORMAL LOW
LACTATE BLOOD ARTERIAL: 0.7 mmol/L (ref <1.3)
O2 SATURATION ARTERIAL: 95 % (ref 94.0–100.0)
PCO2 ARTERIAL: 23.3 mmHg — ABNORMAL LOW (ref 35.0–45.0)
PH ARTERIAL: 7.42 (ref 7.35–7.45)
PO2 ARTERIAL: 70.6 mmHg — ABNORMAL LOW (ref 80.0–110.0)
POTASSIUM WHOLE BLOOD: 4.9 mmol/L — ABNORMAL HIGH (ref 3.4–4.6)
SODIUM WHOLE BLOOD: 135 mmol/L (ref 135–145)

## 2022-10-06 LAB — CBC
HEMATOCRIT: 23.1 % — ABNORMAL LOW (ref 34.0–44.0)
HEMATOCRIT: 22 % — ABNORMAL LOW (ref 34.0–44.0)
HEMOGLOBIN: 7.9 g/dL — ABNORMAL LOW (ref 11.3–14.9)
HEMOGLOBIN: 7.6 g/dL — ABNORMAL LOW (ref 11.3–14.9)
MEAN CORPUSCULAR HEMOGLOBIN CONC: 34.4 g/dL (ref 32.0–36.0)
MEAN CORPUSCULAR HEMOGLOBIN CONC: 34.5 g/dL (ref 32.0–36.0)
MEAN CORPUSCULAR HEMOGLOBIN: 32.5 pg — ABNORMAL HIGH (ref 25.9–32.4)
MEAN CORPUSCULAR HEMOGLOBIN: 33.1 pg — ABNORMAL HIGH (ref 25.9–32.4)
MEAN CORPUSCULAR VOLUME: 94.5 fL (ref 77.6–95.7)
MEAN CORPUSCULAR VOLUME: 95.8 fL — ABNORMAL HIGH (ref 77.6–95.7)
MEAN PLATELET VOLUME: 8.3 fL (ref 6.8–10.7)
MEAN PLATELET VOLUME: 8.3 fL (ref 6.8–10.7)
PLATELET COUNT: 50 10*9/L — ABNORMAL LOW (ref 150–450)
PLATELET COUNT: 51 10*9/L — ABNORMAL LOW (ref 150–450)
RED BLOOD CELL COUNT: 2.44 10*12/L — ABNORMAL LOW (ref 3.95–5.13)
RED BLOOD CELL COUNT: 2.3 10*12/L — ABNORMAL LOW (ref 3.95–5.13)
RED CELL DISTRIBUTION WIDTH: 16.6 % — ABNORMAL HIGH (ref 12.2–15.2)
RED CELL DISTRIBUTION WIDTH: 16.4 % — ABNORMAL HIGH (ref 12.2–15.2)
WBC ADJUSTED: 9.2 10*9/L (ref 3.6–11.2)
WBC ADJUSTED: 8.9 10*9/L (ref 3.6–11.2)

## 2022-10-06 LAB — MAGNESIUM
MAGNESIUM: 1.4 mg/dL — ABNORMAL LOW (ref 1.6–2.6)
MAGNESIUM: 1.5 mg/dL — ABNORMAL LOW (ref 1.6–2.6)

## 2022-10-06 LAB — COMPREHENSIVE METABOLIC PANEL
ALBUMIN: 3.3 g/dL — ABNORMAL LOW (ref 3.4–5.0)
ALBUMIN: 3.3 g/dL — ABNORMAL LOW (ref 3.4–5.0)
ALKALINE PHOSPHATASE: 49 U/L (ref 46–116)
ALKALINE PHOSPHATASE: 50 U/L (ref 46–116)
ALT (SGPT): 16 U/L (ref 10–49)
ALT (SGPT): 17 U/L (ref 10–49)
ANION GAP: 10 mmol/L (ref 5–14)
ANION GAP: 10 mmol/L (ref 5–14)
AST (SGOT): 23 U/L (ref <=34)
AST (SGOT): 20 U/L (ref <=34)
BILIRUBIN TOTAL: 0.6 mg/dL (ref 0.3–1.2)
BILIRUBIN TOTAL: 0.5 mg/dL (ref 0.3–1.2)
BLOOD UREA NITROGEN: 46 mg/dL — ABNORMAL HIGH (ref 9–23)
BLOOD UREA NITROGEN: 45 mg/dL — ABNORMAL HIGH (ref 9–23)
BUN / CREAT RATIO: 25
BUN / CREAT RATIO: 24
CALCIUM: 8.8 mg/dL (ref 8.7–10.4)
CALCIUM: 8.3 mg/dL — ABNORMAL LOW (ref 8.7–10.4)
CHLORIDE: 111 mmol/L — ABNORMAL HIGH (ref 98–107)
CHLORIDE: 112 mmol/L — ABNORMAL HIGH (ref 98–107)
CO2: 14 mmol/L — ABNORMAL LOW (ref 20.0–31.0)
CO2: 15 mmol/L — ABNORMAL LOW (ref 20.0–31.0)
CREATININE: 1.84 mg/dL — ABNORMAL HIGH
CREATININE: 1.87 mg/dL — ABNORMAL HIGH
EGFR CKD-EPI (2021) FEMALE: 29 mL/min/{1.73_m2} — ABNORMAL LOW (ref >=60)
EGFR CKD-EPI (2021) FEMALE: 28 mL/min/{1.73_m2} — ABNORMAL LOW (ref >=60)
GLUCOSE RANDOM: 114 mg/dL (ref 70–179)
GLUCOSE RANDOM: 106 mg/dL (ref 70–179)
POTASSIUM: 4.9 mmol/L — ABNORMAL HIGH (ref 3.4–4.8)
POTASSIUM: 4.9 mmol/L — ABNORMAL HIGH (ref 3.4–4.8)
PROTEIN TOTAL: 5.9 g/dL (ref 5.7–8.2)
PROTEIN TOTAL: 5.8 g/dL (ref 5.7–8.2)
SODIUM: 135 mmol/L (ref 135–145)
SODIUM: 137 mmol/L (ref 135–145)

## 2022-10-06 LAB — BASIC METABOLIC PANEL
ANION GAP: 10 mmol/L (ref 5–14)
BLOOD UREA NITROGEN: 45 mg/dL — ABNORMAL HIGH (ref 9–23)
BUN / CREAT RATIO: 24
CALCIUM: 8.3 mg/dL — ABNORMAL LOW (ref 8.7–10.4)
CHLORIDE: 112 mmol/L — ABNORMAL HIGH (ref 98–107)
CO2: 15 mmol/L — ABNORMAL LOW (ref 20.0–31.0)
CREATININE: 1.87 mg/dL — ABNORMAL HIGH
EGFR CKD-EPI (2021) FEMALE: 28 mL/min/{1.73_m2} — ABNORMAL LOW (ref >=60)
GLUCOSE RANDOM: 106 mg/dL (ref 70–179)
POTASSIUM: 4.9 mmol/L — ABNORMAL HIGH (ref 3.4–4.8)
SODIUM: 137 mmol/L (ref 135–145)

## 2022-10-06 LAB — VANCOMYCIN, RANDOM: VANCOMYCIN RANDOM: 11.3 ug/mL

## 2022-10-06 LAB — CMV DNA, QUANTITATIVE, PCR: CMV VIRAL LD: NOT DETECTED

## 2022-10-06 LAB — EBV QUANTITATIVE PCR, BLOOD: EBV VIRAL LOAD RESULT: NOT DETECTED

## 2022-10-06 LAB — TACROLIMUS LEVEL, TROUGH: TACROLIMUS, TROUGH: 4.7 ng/mL — ABNORMAL LOW (ref 5.0–15.0)

## 2022-10-06 MED ADMIN — predniSONE (DELTASONE) tablet 5 mg: 5 mg | ORAL | @ 13:00:00

## 2022-10-06 MED ADMIN — heparin (porcine) 5,000 unit/mL injection 5,000 Units: 5000 [IU] | SUBCUTANEOUS | @ 21:00:00

## 2022-10-06 MED ADMIN — tacrolimus (PROGRAF) capsule 2 mg: 2 mg | ORAL | @ 13:00:00 | Stop: 2022-10-06

## 2022-10-06 MED ADMIN — meropenem (MERREM) 1 g in sodium chloride 0.9 % (NS) 100 mL IVPB-MBP: 1 g | INTRAVENOUS | @ 07:00:00 | Stop: 2022-10-06

## 2022-10-06 MED ADMIN — pantoprazole (Protonix) EC tablet 40 mg: 40 mg | ORAL | @ 13:00:00

## 2022-10-06 MED ADMIN — sulfamethoxazole-trimethoprim (BACTRIM) 400-80 mg tablet 80 mg of trimethoprim: 1 | ORAL | @ 13:00:00 | Stop: 2023-01-15

## 2022-10-06 MED ADMIN — insulin glargine (LANTUS) injection 6 Units: 6 [IU] | SUBCUTANEOUS | @ 01:00:00

## 2022-10-06 MED ADMIN — heparin (porcine) 5,000 unit/mL injection 5,000 Units: 5000 [IU] | SUBCUTANEOUS | @ 10:00:00

## 2022-10-06 MED ADMIN — furosemide (LASIX) injection 80 mg: 80 mg | INTRAVENOUS | @ 07:00:00 | Stop: 2022-10-06

## 2022-10-06 MED ADMIN — sodium bicarbonate tablet 650 mg: 650 mg | ORAL | @ 17:00:00

## 2022-10-06 MED ADMIN — magnesium oxide (MAG-OX) tablet 400 mg: 400 mg | ORAL | @ 13:00:00 | Stop: 2022-10-06

## 2022-10-06 MED ADMIN — heparin (porcine) 5,000 unit/mL injection 5,000 Units: 5000 [IU] | SUBCUTANEOUS | @ 03:00:00

## 2022-10-06 MED ADMIN — acetaminophen (TYLENOL) tablet 650 mg: 650 mg | ORAL | @ 13:00:00

## 2022-10-06 MED ADMIN — mycophenolate (CELLCEPT) capsule 250 mg: 250 mg | ORAL | @ 01:00:00

## 2022-10-06 MED ADMIN — levothyroxine (SYNTHROID) tablet 112 mcg: 112 ug | ORAL | @ 10:00:00

## 2022-10-06 MED ADMIN — atorvastatin (LIPITOR) tablet 10 mg: 10 mg | ORAL | @ 13:00:00

## 2022-10-06 MED ADMIN — tacrolimus (PROGRAF) capsule 2 mg: 2 mg | ORAL | @ 01:00:00

## 2022-10-06 NOTE — Unmapped (Signed)
Tacrolimus Therapeutic Monitoring Pharmacy Note    Julie [name] is a 72 y.o. female continuing tacrolimus.     Indication: Kidney transplant     Date of Transplant:  08/06/22       Prior Dosing Information: Current regimen 2 mg BID      Source(s) of information used to determine prior to admission dosing: Home Medication List or Clinic Note    Goals:  Therapeutic Drug Levels  Tacrolimus trough goal:  7-9 ng/mL    Additional Clinical Monitoring/Outcomes  Monitor renal function (SCr and urine output) and liver function (LFTs)  Monitor for signs/symptoms of adverse events (e.g., hyperglycemia, hyperkalemia, hypomagnesemia, hypertension, headache, tremor)    Results:   Tacrolimus level:  4.7 ng/mL, drawn early    Pharmacokinetic Considerations and Significant Drug Interactions:  Concurrent hepatotoxic medications: None identified  Concurrent CYP3A4 substrates/inhibitors: None identified  Concurrent nephrotoxic medications: None identified    Assessment/Plan:  Recommendedation(s)  Per transplant nephrology, Increase to 3 mg in the morning and 2 mg in the evening    Follow-up  Next level should be ordered on 10/08/22 at 0600 .   A pharmacist will continue to monitor and recommend levels as appropriate    Please page service pharmacist with questions/clarifications.    Elmer Bales, PharmD

## 2022-10-06 NOTE — Unmapped (Signed)
Vancomycin Therapeutic Monitoring Pharmacy Note    Julie [name] is a 72 y.o. female continuing vancomycin. Date of therapy initiation: 3/14.     Indication: Bacteremia/Sepsis    Prior Dosing Information:  Vancomycin 1250mg  IV x1 in the ED       Goals:  Therapeutic Drug Levels  Vancomycin trough goal: 10-15 mg/L    Additional Clinical Monitoring/Outcomes  Renal function, volume status (intake and output)    Results: Vancomycin level 11.3 mg/L, drawn appropriately. Only received one dose of vancomycin dosing by level due to recent kidney transplant and presenting with worsening renal function.     Wt Readings from Last 1 Encounters:   10/04/22 61.2 kg (135 lb)     Creatinine   Date Value Ref Range Status   10/06/2022 1.87 (H) 0.55 - 1.02 mg/dL Final   16/04/9603 5.40 (H) 0.55 - 1.02 mg/dL Final   98/05/9146 8.29 (H) 0.55 - 1.02 mg/dL Final        Pharmacokinetic Considerations and Significant Drug Interactions:  Per linear dose adjustment  Concurrent nephrotoxic meds: not applicable    Assessment/Plan:  Recommendation(s)  Stopping Vancomycin per discussion with primary team based on culture results.   Estimated trough on recommended regimen: Not applicable - dosing by level    Follow-up  Level due: No further levels indicated at this time    Please page service pharmacist with questions/clarifications.    Pamella Pert, PharmD Candidate     Charlestine Massed, PharmD  PGY1 Pharmacy Resident - Ambulatory Care Track

## 2022-10-06 NOTE — Unmapped (Signed)
Internal Medicine (MEDW) Progress Note    Assessment & Plan:   Julie [name] is a 72 y.o. female whose presentation is complicated by DDKT, HTN, hypothyroidism, T2DM that presented to [facility] with fevers, nausea and vomiting, diarrhea found to have Gram Negative Bacteremia.     Active Problems  GNR bacteremia iso immunocompromised status - Fever, N/V/D  Patient presenting with 48-hour history of fevers, febrile in ED 100.4, 24-hour history of nausea/vomiting and diarrhea.  No leukocytosis on labs, though WBC is elevated from baseline (10.6 3/14 usually ~5-7) patient does have left shift with neutrophils of 9.7.  Unclear etiology most likely gastroenteritis given acute nausea vomiting diarrhea, could also be secondary to gram-negative bacteremia.  Lower suspicion of respiratory, urinary source given patient is asymptomatic.  Patient is immunocompromised and is on tacrolimus, CellCept, prednisone. Will start patient on broad-spectrum antibiotics and do full infectious workup. Chest XR in the ED showed bibasilar atelectasis and signs of early pulmonary edema versus bronchiolitis. Was given 1L LR in ED, patient later felt acute onset shortness of breath. Crackles heard on auscultation. Repeat chest XR 3/14 in setting of SOB showed worsening interstitial pulmonary edema. Bcx (+) GNR. VIR consulted for drainage of perinephric fluid collection for culture and possible source control, likely 3/18.   -ID consulted, appreciate assistance   -ICID following; appreciate recs  -F/U UCx, CMV, EBV pending  -C diff, GIPP need to be collected  -Continue bactrim for PJP prophylaxis  -Continue valganciclovir for CMV prophylaxis  -Continue tacrolimus, s/p 2 tacrolimus trough, PTD  -Continue prednisone 5mg  daily  -s/p vancomycin (1250mg  once 3/14)  -d/c cefepime  -d/c CellCept 250mg  BID (500mg  BID home dose)     DDKT in 2024  DCD, KDPI 56%, Basiliximab induction, CMV +/+, EBV +/+, standard anatomy. Post kidney transplant course has been notable for: E coli UTI.  Renal function still pending, patient most likely has prerenal AKI given poor p.o. intake. Renal US 3/14 showed small new perinephric collection which may represent hematoma, complicated seroma, or abscess. Stable resistive indices in the renal transplant arteries, which are mildly elevated. UA 3/14 positive for leukocyte esterase and WBC. CTAP 3/14 showed perinephric collection consistent with previous renal US and c/f renal infection, possibly pyelonephritis was not definitive given lack of contrast. Nephrology and transplant medicine will discuss feasibility of draining fluid collection for culture.   -Immunosuppression/prophylaxis stated above  -Transplant medicine and nephrology consulted, appreciate assistance    Acute on Chronic Thrombocytopenia- anemia on CKD  Unclear baseline appears to be between 90-100K, on presentation 72 K. Hgb 7.6 3/5 from 8.2 at admission. No bleeding noted. Platelets now 51 from 51 3/14.   -Daily CBC    T2DM  Last A1C 5.6.  Home regimen Lantus 12 units nightly, lispro 3 units with meals, with sliding scale.  Will decrease insulin regimen given poor p.o. intake. Glucose 108-120.  -Decrease Lantus 6 units nightly  -SSI  -Hold mealtime insulin    HTN  Will continue hypertension medication since patient is slightly hypertensive, will hold any nephrotoxic agents  -Start clonidine 0.1 mg TID 8h  -Hold amlodipine 10 mg daily  -Hold hydralazine 100 mg TID  -Hold carvedilol 25 mg BID  -Hold chorthalidone 25 mg daily  -Hold spironolactone 25 mg daily    Chronic Problems  Hypothyroidism: Continue Synthroid      The patient's presentation is complicated by the following clinically significant conditions requiring additional evaluation and treatment: - Thrombocytopenia POA requiring further investigation or  monitor  - Chronic kidney disease POA requiring further investigation, treatment, or monitoring   - Hypokalemia POA requiring further investigation, treatment, or monitoring  - Acidosis POA requiring further investigation, treatment, or monitoring     Issues Impacting Complexity of Management:  -Intensive monitoring of drug toxicity from Vancomycin with scheduled BMP and/or Vancomycin levels and Cefepime with scheduled BMP    Checklist:  Diet: Regular Diet  DVT PPx: Heparin 5000units q8h  Code Status: Full Code  Dispo: Patient appropriate for Inpatient based on expectation of ongoing need for hospitalization greater than two midnights based on severity of presentation/services including IV abx    Team Contact Information:   Primary Team: Internal Medicine (MEDW)  Primary Resident: Herbie Saxon, MD  Resident's Pager: [phone] [facility])    Interval History:   Overnight patient experienced AMS and was febrile to 101.1. A rapid response was called with repeat CXR and was given 80mg  lasix. Patient had several episodes of incontinence and diarrhea.     This morning the patient was awake and responsive to questions. She reports improved shortness of breath.     Daughter, Lorene Dy visiting today. Would like to be called with any changes to patient status or location. She notes that her mom has had episodes of confusion and AMS in the past while experiencing fever and has some baseline dementia. Sister, Morrie Sheldon, is flying in today.    All other systems were reviewed and are negative except as noted in the HPI    Objective:   Temp:  [36.5 ??C (97.7 ??F)-38.8 ??C (101.9 ??F)] 37.2 ??C (99 ??F)  Heart Rate:  [69-95] 69  SpO2 Pulse:  [69-92] 69  Resp:  [14-27] 17  BP: (91-161)/(27-87) 91/40  SpO2:  [92 %-100 %] 92 %    Initial physical exam:  Gen: NAD, somnolent, responsive to questions  HENT: atraumatic, normocephalic  Neuro: A&O x 4, 5/5 strength bilaterally, sensation same bilaterally  Heart: RRR, harsh systolic murmur  Lungs: mild bilateral crackles at lung bases   Abdomen: soft, NTND  Extremities: No edema    Mercer Pod, MS3    I attest that I have reviewed the student note and that the components of the history of the present illness, the physical exam, and the assessment and plan documented were performed by me or were performed in my presence by the student where I verified the documentation and performed (or re-performed) the exam and medical decision making.    Herbie Saxon, MD  PGY-2 Internal Medicine & Pediatrics

## 2022-10-06 NOTE — Unmapped (Signed)
Transplant Nephrology Consult     Requesting provider: Era Skeen  Service requesting consult: MDW  Reason for consult: AKI, transplant      Assessment/Recommendations: Julie [name] is a/an 72 y.o. 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    #Status post kidney transplant, Allograft Function (unstable), AKI:   - Serum creatinine nadired at 1.44 outpatient, was 2.05 on admission.  - Has trended 2.05 -> 1.79 -> 1.84 -> 1.87 most recently.  - Initial AKI likely due to volume depletion from N/V/D, may have some developing ATN as a result of sepsis/hypotension. Would not be terribly surprised if creatinine rises tomorrow.    #Immunosuppression [High risk medical decision making for drug therapy requiring intensive monitoring for toxicity]  - Home regimen: tacrolimus (Prograf) 2 mg BID, MMF 500 mg BID, prednisone 5 mg daily.  - Would hold Cellcept completely given bacteremia and hypotension.  - Please obtain trough tacrolimus levels prior to the morning dose of the medication. The tacrolimus goal level is 7-9 ng/mL.   - Tacrolimus level of 4.7 today is an 8 hour trough, and below goal (especially given that Cellcept is on hold). Agree with increase from 2 mg BID to 3 mg in the am and 2 mg in the pm.    # Perinephric fluid collection  - Reviewed CT from 10/05/22 with Dr. Edwin Dada from transplant surgery. He felt the fluid was not likely infected. He was not opposed to sampling the fluid collection, but given how far posterior it was, he felt it was unlikely that VIR would be able to access it safely.   - Would reach out to VIR to get their opinion about sampling, but would not move forward unless they are sure they can do it relatively easily.    #  E.coli bacteremia  - Agree that urine is the most likely source, urine culture also growing E.coli.  - On meropenem, sensitivities pending. # BP Management:  - Home regimen: amlodipine 10 mg daily, carvedilol 25 mg BID, clonidine 0.1 mg TID, hydralazine 100 mg TID, chorthalidone 25 mg daily, spironolactone 25 mg daily.   - Agree with holding antihypertensives in setting of hypotension.  - She has always had a very wide pulse pressure/low diastolics. Has limited systolic blood pressure control in the past. Fortunately MAP adequate at present.    # Infectious Prophylaxis and Monitoring:   CMV D+/R+, EBV D+/R+  CMV ppx:  Valcyte x3 mo  PJP ppx: Bactrim x6 mo    Discussed recommendations with primary team.     Drinda Butts, MD  Division of Nephrology and Hypertension  [facility]  10/06/2022      _____________________________________________________________________________________    Kidney Transplant History:   Date of Transplant: 08/06/2022 (Kidney)  Type of Transplant: DCD  KDPI: 56%  Cold ischemic time: 877 minutes (14 hr 37 min)  Warm ischemic time: 32 minutes  cPRA: 0%  HLA match:   Blood type: Donor A1, Recipient A POS  ID: CMV D+/R+, EBV D+/R+  Native Kidney Disease: Diabetes              Native kidney biopsy: Diabetic nephropathy (done elsewhere)              Pre-transplant dialysis course: PD started July 2017, transitioned to HD October 2019 due to catheter dysfunction/inadequate dialysis  Prior Transplants: None  Induction: Basiliximab  Early steroid withdrawal: No    Subjective/Overnight events:    Had a rapid response overnight for acute increase in SOB. Was also febrile and noted to have rising lactate. Symptomatically improved with Lasix 80 mg IV. This am patient denies ongoing pain or SOB, though answers are limited.      Medications:   Current Facility-Administered Medications   Medication Dose Route Frequency Provider Last Rate Last Admin    acetaminophen (TYLENOL) tablet 650 mg  650 mg Oral Q6H PRN Gracelyn Nurse, MD   650 mg at 10/06/22 0845    acetaminophen (TYLENOL) tablet 650 mg  650 mg Oral Q6H PRN Gracelyn Nurse, MD        aluminum-magnesium hydroxide-simethicone (MAALOX MAX) 80-80-8 mg/mL oral suspension  30 mL Oral Q4H PRN Gracelyn Nurse, MD        [Provider Hold] amlodipine (NORVASC) tablet 10 mg  10 mg Oral Daily Gracelyn Nurse, MD   10 mg at 10/05/22 0930    atorvastatin (LIPITOR) tablet 10 mg  10 mg Oral Daily Gracelyn Nurse, MD   10 mg at 10/06/22 0845    dextrose (D10W) 10% bolus 125 mL  12.5 g Intravenous Q10 Min PRN Gracelyn Nurse, MD        glucagon injection 1 mg  1 mg Intramuscular Once PRN Gracelyn Nurse, MD        glucose chewable tablet 16 g  16 g Oral Q10 Min PRN Gracelyn Nurse, MD        guaiFENesin (ROBITUSSIN) oral syrup  200 mg Oral Q4H PRN Gracelyn Nurse, MD        heparin (porcine) 5,000 unit/mL injection 5,000 Units  5,000 Units Subcutaneous [facility] Gracelyn Nurse, MD   5,000 Units at 10/06/22 0530    [Provider Hold] hydrALAZINE (APRESOLINE) tablet 100 mg  100 mg Oral Q8H SCH Gracelyn Nurse, MD   100 mg at 10/05/22 1450    insulin glargine (LANTUS) injection 6 Units  6 Units Subcutaneous Nightly Gracelyn Nurse, MD   6 Units at 10/05/22 2045    insulin lispro (HumaLOG) injection 0-20 Units  0-20 Units Subcutaneous ACHS Gracelyn Nurse, MD        levothyroxine (SYNTHROID) tablet 112 mcg  112 mcg Oral daily Gracelyn Nurse, MD   112 mcg at 10/06/22 0530    magnesium oxide-Mg AA chelate (Magnesium Plus Protein) 2 tablet  2 tablet Oral BID Daine Floras, MD        melatonin tablet 3 mg  3 mg Oral Nightly PRN Gracelyn Nurse, MD        meropenem (MERREM) 1 g in sodium chloride 0.9 % (NS) 100 mL IVPB-MBP  1 g Intravenous Q24H Gracelyn Nurse, MD   Stopped at 10/06/22 0321    ondansetron (ZOFRAN-ODT) disintegrating tablet 4 mg  4 mg Oral Q8H PRN Gracelyn Nurse, MD        Or    ondansetron [facility]) injection 4 mg  4 mg Intravenous Q8H PRN Gracelyn Nurse, MD        pantoprazole (Protonix) EC tablet 40 mg  40 mg Oral Daily Gracelyn Nurse, MD   40 mg at 10/06/22 0845    predniSONE (DELTASONE) tablet 5 mg  5 mg Oral Daily Gracelyn Nurse, MD   5 mg at 10/06/22 0845    sodium bicarbonate tablet 650 mg  650 mg Oral BID Daine Floras, MD  sulfamethoxazole-trimethoprim (BACTRIM) 400-80 mg tablet 80 mg of trimethoprim  1 tablet Oral Once per day on Monday Wednesday Friday Gracelyn Nurse, MD   80 mg of trimethoprim at 10/06/22 0845    tacrolimus (PROGRAF) capsule 2 mg  2 mg Oral BID Gracelyn Nurse, MD   2 mg at 10/06/22 0845    valGANciclovir (VALCYTE) tablet 450 mg  450 mg Oral Every other day Gracelyn Nurse, MD   450 mg at 10/05/22 0930        ALLERGIES  Oxycodone, Penicillins, and Valacyclovir    Review of Systems:  As per HPI, all other systems reviewed and negative    Physical Exam:  Vitals:    10/06/22 1121   BP: 91/40   Pulse: 69   Resp: 17   Temp: 37.2 ??C (99 ??F)   SpO2: 92%     No intake/output data recorded.    Intake/Output Summary (Last 24 hours) at 10/06/2022 1138  Last data filed at 10/06/2022 0615  Gross per 24 hour   Intake 240 ml   Output 0 ml   Net 240 ml       General: no acute distress  HEENT: anicteric sclera  CV: no peripheral edema  Lungs: minimally increased work of breathing, [place] oxygen in place  Abdomen: non-distended  Skin: no visible lesions or rashes      Test Results  Reviewed  Lab Results   Component Value Date    NA 137 10/06/2022    NA 137 10/06/2022    NA 136 10/06/2022    K 4.9 (H) 10/06/2022    K 4.9 (H) 10/06/2022    K 4.9 (H) 10/06/2022    CL 112 (H) 10/06/2022    CL 112 (H) 10/06/2022    CO2 15.0 (L) 10/06/2022    CO2 15.0 (L) 10/06/2022    BUN 45 (H) 10/06/2022    BUN 45 (H) 10/06/2022    CREATININE 1.87 (H) 10/06/2022    CREATININE 1.87 (H) 10/06/2022    GLU 106 10/06/2022    GLU 106 10/06/2022    CALCIUM 8.3 (L) 10/06/2022    CALCIUM 8.3 (L) 10/06/2022    ALBUMIN 3.3 (L) 10/06/2022    PHOS 2.2 (L) 10/05/2022

## 2022-10-06 NOTE — Unmapped (Signed)
IMMUNOCOMPROMISED HOST INFECTIOUS DISEASE CONSULT NOTE    Julie [name] is being seen in consultation at the request of [name], MD for evaluation of UTI in transplant patient.    Assessment/Recommendations:    Julie [name] is a 72 y.o. 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     Summary of pertinent prior infections:  #History of Shingles 2017  #04/2018 Dialysis fluid [place] R: Ampicillin, Unasyn, Cefazolin,S: Ceftriaxone, Gentamicin, Levo, PipTazo, Tobra  #Donor urine cx with C. Dubliensis 08/05/22, <10K, negative recipient samples, negative donor blood cx, treated with 2 weeks PO fluconazole  #E. Coli bacteremia and UTI 08/21/22  - Ucx/Bcx 1/29 E. Coli, R amp, amp/sulb, TMP/SMX. Susc CFZ, cipro/levo, gent/tobra, nitrofurantoin, tetra  - Treated with vanc/CFP -> 3 weeks levofloxacin through 2/26 (ureteral stent removed 2/15)     Active infection:  # E. coli bacteremia 10/04/22, likely urinary source   # Perinephric fluid collection 10/05/22  - 3/12 developed fever and malaise at home, per family possible poor PO intake, diarrhea as well  - 3/14 UA 26 WBC, Ucx not collected  - 3/14 Renal US 8.1 x 0.8 x 2.3 cm perinephric collection new from prior, considering small hematoma, complicated seroma, or less likely abscess  - 3/14 Bcx 2/2 E. Coli, pending susc  - 3/14 CT A/P without contrast: perinephric stranding, 5.3 x 2.0 cm ill-defined perinephric collection limited evaluation without contrast. No stones seen. 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 Coli bacteremia presumed 2/2 UTI in last 6 weeks would be concerned about persistent nidus of infection, such as abscess. [place] and CT with new perinephric collection that doesn't appear classic for abscess, but would be concerned given infection hx and the collection appearing this far after transplant. Appreciate transplant surgery input, particularly regarding safety of sampling this fluid collection    Diagnosis  Follow-up Bcx susceptibilities  Repeat Bcx 3/16 to ensure clearance (particularly with possible niduses of infection such as abscess, LUE graft, etc.)  Follow-up Urine cx - obtained ~6 hrs after antibiotic administration  Follow-up CMV PCR, EBV PCR, C. Diff, GIPP (if diarrhea seen)  Would preferably pursue sampling of perinephric fluid collection given new from prior study and pt with 2 episodes of UTI with bacteremia - would reach out to transplant surgery for safety of sampling, +/- IR  Will discuss with transplant  team further evaluation for retention / urologic abnormalities that could be contributing to her recurrent UTIs     Management  Continue meropenem, renally dosed equivalent of 1g q8h, while awaiting susceptibilities    Antimicrobial prophylaxis required for host deficiency: transplant immunosuppression  Viral: Valganciclovir  PJP: TMP/SMX MWF    Intensive toxicity monitoring for prescription antimicrobials   CBC w/diff at least once per week  CMP at least once per week  clinical assessments for rashes or other skin changes    The [facility] ID service will continue to follow.           Please page the ID Transplant/Liquid Oncology Fellow consult at [phone] with questions.  Patient discussed with Dr. [name].    [name], MD  [facility] Division of Infectious Diseases    History of Present Illness:      External record(s): Primary team note: rapid called overnight for fevers/chills and AMS, broadened to mero .    Independent historian(s): no independent historian required.       Interval hx:  Overnight noted to be somnolent and intermittently agitated, also with fever and rigors. Rapid response called to help expedite workup and transfer to stepdown. Broadened to meropenem.  Tmax 38.4  On 2-4 L [place]  BP have been mostly stable, though some values with low DBP  On interview pt says she feels about the same, still tired and with poor appetite. Notes she is urinating a lot but no dysuria or abd pain.      Medications:   Antimicrobials:             Vital Signs last 24 hours:  Temp:  [36.5 ??C (97.7 ??F)-38.8 ??C (101.9 ??F)] 37.3 ??C (99.1 ??F)  Heart Rate:  [70-95] 74  SpO2 Pulse:  [72-92] 72  Resp:  [14-36] 27  BP: (97-161)/(27-91) 161/27  MAP (mmHg):  [64-103] 76  SpO2:  [92 %-100 %] 100 %    Physical Exam:  Patient Lines/Drains/Airways Status       Active Active Lines, Drains, & Airways       Name Placement date Placement time Site Days    Peripheral IV 10/05/22 Anterior;Proximal;Right Forearm 10/05/22  0335  Forearm  1    Arteriovenous Fistula - Vein Graft  Access 08/24/16 Left;Upper Arm 08/24/16  --  Arm  2234                    Const [x]  vital signs above    []  NAD, non-toxic appearance []  Chronically ill-appearing, non-distressed  Lying in bed, somnolent but easily arouses to voice      Eyes [x]  Lids normal bilaterally, conjunctiva anicteric and noninjected OU     [] PERRL  [] EOMI        ENMT [x]  Normal appearance of external nose and ears, no nasal discharge        [x]  MMM, no lesions on lips or gums []  No thrush, leukoplakia, oral lesions  []  Dentition good []  Edentulous []  Dental caries present  []  Hearing normal  []  TMs with good light reflexes bilaterally         Neck [x]  Neck of normal appearance and trachea midline        []  No thyromegaly, nodules, or tenderness   []  Full neck ROM  Supple      Lymph [x]  No LAD in neck     []  No LAD in supraclavicular area     []  No LAD in axillae   []   No LAD in epitrochlear chains     []  No LAD in inguinal areas        CV [x]  RRR            []  No peripheral edema     []  Pedal pulses intact   []  No abnormal heart sounds appreciated   []  Extremities WWP   II/VI murmur LUSB      Resp []  Normal WOB at rest    []  No breathlessness with speaking, no coughing  []  CTA anteriorly    []  CTA posteriorly    Slightly tachypneic  Scattered crackles anteriorly      GI [x]  Normal inspection, NTND   []  NABS     []  No umbilical hernia on exam       []  No hepatosplenomegaly     []  Inspection of perineal and perianal areas normal  No pain over transplant kidney, scars well healed  No pain over bladder      GU []  Normal external genitalia     [] No urinary catheter present in urethra   []  No CVA tenderness    []  No tenderness over renal allograft        MSK []  No clubbing or cyanosis of hands       []  No vertebral point tenderness  []  No focal tenderness or abnormalities on palpation of joints in RUE, LUE, RLE, or LLE        Skin [x]  No rashes, lesions, or ulcers of visualized skin     []  Skin warm and dry to palpation   LUE prior graft in place with thrill      Neuro [x]  Face expression symmetric  []  Sensation to light touch grossly intact throughout    [x]  Moves extremities equally    []  No tremor noted        []  CNs II-XII grossly intact     []  DTRs normal and symmetric throughout []  Gait unremarkable        Psych []  Appropriate affect       []  Fluent speech         []  Attentive, good eye contact  []  Oriented to person, place, time          []  Judgment and insight are appropriate   Awakens to voice, speech slightly slow/confused        Data for Medical Decision Making     (3/14) EKG QTcF 438    I discussed mgm't w/qualified health care professional(s) involved in case: primary team regarding workup and micro and/or path w/lab personnel susceptibilities for E. coli .    I reviewed CBC results (WBC 8.9, improving. Plt 51, low but stable), chemistry results (Cr 1.87, slightly improved), micro result(s) (Bcx 3/14 with GNR, lactose fermenter), and radiology report(s) (CXR with slightly increased edema pattern. CT A/P with perinephric stranding and collection).    I independently visualized/interpreted CT images (A/P without contrast, limited evaluation of transplant kidney but ill-defined perinephric collection appreciated).       Recent Labs   Lab Units 10/06/22  0419 10/06/22  0242 10/05/22  1110   WBC 10*9/L 8.9   < > 10.6   HEMOGLOBIN g/dL 7.6*   < > 7.8*   PLATELET COUNT (1) 10*9/L 51*   < > 64*   NEUTRO ABS 10*9/L  --   --  10.1*   LYMPHO ABS 10*9/L  --   --  0.3*   EOSINO ABS 10*9/L  --   --  0.0   BUN mg/dL 45* - 45*   < > 48*   CREATININE mg/dL 1.61* - 0.96*   < > 0.45*   AST U/L 20   < > 21   ALT U/L 17   < > 19   BILIRUBIN TOTAL mg/dL 0.5   < > 0.4   ALK PHOS U/L 50   < > 43*   POTASSIUM WHOLE BLOOD mmol/L 4.9*   < >  --    POTASSIUM mmol/L 4.9* - 4.9*   < > 5.4*   MAGNESIUM mg/dL 1.5*   < > 1.7   PHOSPHORUS mg/dL  --   --  2.2*   CALCIUM mg/dL 8.3* - 8.3*   < > 8.2*    < > = values in this interval not displayed.       Lab Results   Component Value Date    Tacrolimus, Trough 3.6 (L) 10/05/2022       Microbiology:  Microbiology Results (last day)       Procedure Component Value Date/Time Date/Time    Blood Culture [[id number]]  (Abnormal) Collected: 10/05/22 0339    Lab Status: Preliminary result Specimen: Blood from 1 Peripheral Draw Updated: 10/06/22 0927     Blood Culture, Routine Gram Negative Rod, Lactose Fermenter     Gram Stain Result Gram negative rods (bacilli) Blood Culture [[id number]]  (Abnormal) Collected: 10/05/22 0340    Lab Status: Preliminary result Specimen: Blood from 1 Peripheral Draw Updated: 10/05/22 2105     Blood Culture, Routine Positive Culture, Results to Follow     Gram Stain Result Gram negative rods (bacilli)    Urine Culture [[id number]] Collected: 10/05/22 1002    Lab Status: In process Specimen: Urine from Clean Catch Updated: 10/05/22 1612            Imaging:  XR Chest Portable    Result Date: 10/06/2022  EXAM: XR CHEST PORTABLE ACCESSION: [id number] UN     CLINICAL INDICATION: SHORTNESS OF BREATH      TECHNIQUE: Single View AP Chest Radiograph.     COMPARISON: Prior day chest radiograph     FINDINGS:     Slightly improved interstitial pulmonary edema. No pleural effusion or pneumothorax.     Stable enlarged cardiac silhouette. Tortuous thoracic aorta with extensive calcifications. Unchanged left subclavian vascular stents.             Slightly improved interstitial pulmonary edema.    ECG 12 Lead    Result Date: 10/05/2022  NORMAL SINUS RHYTHM ST & T WAVE ABNORMALITY, CONSIDER LATERAL ISCHEMIA ABNORMAL ECG WHEN COMPARED WITH ECG OF 05-Oct-2022 10:07, T WAVE INVERSION NO LONGER EVIDENT IN ANTERIOR LEADS Confirmed by Pollyann Kennedy [phone]) on 10/05/2022 11:18:09 PM    ECG 12 Lead    Result Date: 10/05/2022  NORMAL SINUS RHYTHM ST & T WAVE ABNORMALITY, CONSIDER LATERAL ISCHEMIA ABNORMAL ECG WHEN COMPARED WITH ECG OF 21-Aug-2022 12:02, NO SIGNIFICANT CHANGE WAS FOUND Confirmed by Pollyann Kennedy ([id number]) on 10/05/2022 10:12:36 PM    ECG 12 Lead    Result Date: 10/05/2022  POOR DATA QUALITY, INTERPRETATION MAY BE ADVERSELY AFFECTED NORMAL SINUS RHYTHM ST & T WAVE ABNORMALITY, CONSIDER LATERAL ISCHEMIA ABNORMAL ECG WHEN COMPARED WITH ECG OF 05-Oct-2022 05:40, NO SIGNIFICANT CHANGE WAS FOUND Confirmed by Pollyann Kennedy ([id number]) on 10/05/2022 10:11:52 PM    XR Abdomen 1 View    Result Date: 10/05/2022  EXAM: XR ABDOMEN 1 VIEW ACCESSION: [id number] UN     CLINICAL INDICATION: 73 years old with  ABDOMINAL PAIN  -  OTHER SPECIFIED SITE      COMPARISON: Same day chest radiograph     TECHNIQUE: Supine view of the abdomen, 1 images(s)     FINDINGS: Mild to moderate gaseous distention of the stomach. No other dilated loops of bowel in the visualized abdomen. Vascular calcifications. Degenerative changes of the lower lumbar spine. Please see earlier same day chest radiograph for findings above the diaphragm.         Nonspecific, nonobstructive bowel gas pattern.         XR Chest Portable    Result Date: 10/05/2022  EXAM: XR CHEST PORTABLE ACCESSION: [id number] UN     CLINICAL INDICATION: DYSPNEA      TECHNIQUE: Single View AP Chest Radiograph.     COMPARISON: Chest x-ray 10/05/2022     FINDINGS:     Interstitial pulmonary edema. No pleural effusion or pneumothorax.     Enlarged cardiac silhouette. Tortuous thoracic aorta with extensive calcifications.     Left subclavian vascular stents.         Worsening interstitial pulmonary edema.    US Renal Transplant W Doppler    Result Date: 10/05/2022  EXAM: US RENAL Glenna Durand ACCESSION: [id number] UN     CLINICAL INDICATION: 72 years old with AKI on recent transplant     COMPARISON: 08/21/2022 renal transplant ultrasound.     TECHNIQUE:  Ultrasound views of the renal transplant were obtained using gray scale and color and spectral Doppler imaging. Views of the urinary bladder were obtained using gray scale and limited color Doppler imaging.     FINDINGS:     TRANSPLANTED KIDNEY: The renal transplant was located in the left lower quadrant. Normal size and echogenicity.  No solid masses or calculi. 8.1 x 0.8 x 2.3 cm perinephric collection with trace internal echoes, not definitively seen on prior. No hydronephrosis.     VESSELS: - Perfusion: Using power Doppler, normal perfusion was seen throughout the renal parenchyma. - Resistive indices in the renal transplant are stable compared with prior examination. - Main renal artery/iliac artery: Patent - Main renal vein/iliac vein: Patent     BLADDER: Unremarkable.         Small new perinephric collection with low-level internal echoes, possibly small hematoma, complicated seroma, or less likely abscess.     Stable resistive indices in the renal transplant arteries, which are mildly elevated. Previously identified subtle tardus parvus pattern is not well visualized on the current examination. Similar mildly elevated main renal artery velocity with 3 fold steup up velocity compared to the iliac artery, not specific in the absence of tardus parvus pattern in the segmental arteries. Clinical correlation is recommended.     ==================== MODIFIED REPORT: (10/05/2022 10:26 AM) This report has been modified from its preliminary version; you may check the prior versions of radiology report, results history link for prior report versions (if they were previously visible in Epic).     -----------------------------------------------         Please see below for data measurements:     Transplant location: LLQ     Renal Transplant: Sagittal 12.4 cm; AP 5.5 cm; Transverse 5.2 cm     Segmental artery superior resistive index: 0.84 (previously 0.79) Segmental artery mid resistive index: 0.87 (0.84) Segmental artery inferior resistive index: 0.77 (0.81)     Previous resistive indices range of segmental arteries: 0.79-0.84     Main renal artery peak systolic velocity at anastomosis: 2.4 m/s, previously 2.2 m/s Main renal  artery hilum resistive index: 0.91 (0.88) Main renal artery mid resistive index: 0.91 (0.93) Main renal artery anastomosis resistive index: 0.92 (0.91)     Previous resistive indices range of main renal artery: 0.88-0.93     Main renal vein: patent     Iliac artery: Patent Iliac vein: Patent     Bladder volume prevoid: 143.03  mL Bladder volume postvoid: mL                 XR Chest Portable    Result Date: 10/05/2022  EXAM: XR CHEST PORTABLE ACCESSION: [id number] UN     CLINICAL INDICATION: COUGH      TECHNIQUE: Single View AP Chest Radiograph.     COMPARISON: Chest radiograph 08/24/2022     FINDINGS:     Lungs are low in volume with bibasilar atelectasis. Peribronchovascular cuffing. No pleural effusion or pneumothorax.     Normal heart size and mediastinal contours. Aortic arch calcifications. Tortuous thoracic aorta. Left-sided vascular stents.             No acute airspace disease. Peribronchovascular cuffing which may reflect early pulmonary edema versus bronchiolitis     ==================== MODIFIED REPORT: (10/05/2022 8:10 AM) This report has been modified from its preliminary version; you may check the prior versions of radiology report, results history link for prior report versions.     -----------------------------------------------

## 2022-10-06 NOTE — Unmapped (Signed)
Pt new admission from the ED to 8 BT Room 8321 via stretcher accompanied per daughter . Adm dx of Fever . Lungs with diminished sounds , 02 @ 2 LPM via [place]  , continuous pulse oximetry monitor initiated as ordered . Pt A & O x 3-4 and able to express self , low volume .  HOH , bilateral hearing aids on . Initial assessment completed , VSS , afebrile . Pt denied pain , 0/10 , no N/V , no SOB/dyspnea . Skin assessment completed with 2nd RN [facility] ) as per protocol . Skin dry but intact . Assistance with ADLS provided . Pt OOB to [facility] with 1-person assistance . Gait slightly unsteady  . Falls  and safety precautions reinforced with bed low and locked , SR up x 3 , call bell within reach , bed alarm on . Daughter @ the bedside and very supportive early part of shift . Pt resting quietly in bed , no acute distress noted . Will continue with current POC .       Pt noted to be more restless with frequent urge to void as the PM progress . Several attempts to get OOB unassisted . Pt easy to re-direct and re-oriented . Tele sitter initiated .     Pt noted to have increased tremors and changes in LOC . Pt appeared to be more confused , slow to respond with frequent urination . Med team paged . MD in room to assess  pt.  V/S taken , febrile with temp of 38.4 C  , pt with diarrhea .  Assistance with ADLs and perineal care provided . Orders for PCXR , and labs received .    Rapid Response called d/t change in LOC , tremors and fever . Arrival of RRT @ the bedside to assess and monitor pt . Labs drawn , PCXR done , Lasix 80 mg IV adm as ordered  , IV antibiotic initiated . Bed assigned  , pt to be [facility] Room 4732 . Report called to the receiving RN , all questions answered appropriately .     Pt transferred to C[facility]U via stretcher accompanied per RRT         Problem: Adult Inpatient Plan of Care  Goal: Plan of Care Review  Outcome: Progressing  Goal: Patient-Specific Goal (Individualized)  Outcome: Progressing  Flowsheets (Taken 10/06/2022 0327)  Patient/Family-Specific Goals (Include Timeframe): Pt will remain free from falls and injuries during this shift 7p - 7a .  Individualized Care Needs: New admission from ED , VS , labs , pain mgmt , frequent urination , diarrhea , pure wick ,  enteric precaution , falls/safety precautions .  Anxieties, Fears or Concerns: Pt concerned about frequent urination .  Goal: Absence of Hospital-Acquired Illness or Injury  Outcome: Progressing  Intervention: Identify and Manage Fall Risk  Recent Flowsheet Documentation  Taken 10/06/2022 0200 by Harlow Ohms, RN  Safety Interventions:   fall reduction program maintained   environmental modification   low bed   nonskid shoes/slippers when out of bed   isolation precautions  Taken 10/06/2022 0000 by Harlow Ohms, RN  Safety Interventions:   fall reduction program maintained   environmental modification   low bed   nonskid shoes/slippers when out of bed  Taken 10/05/2022 2200 by Harlow Ohms, RN  Safety Interventions:   fall reduction program maintained   environmental modification   low bed   nonskid shoes/slippers when out of  bed   isolation precautions  Taken 10/05/2022 2000 by Harlow Ohms, RN  Safety Interventions:   fall reduction program maintained   environmental modification   low bed   nonskid shoes/slippers when out of bed   room near unit station  Intervention: Prevent Skin Injury  Recent Flowsheet Documentation  Taken 10/05/2022 2000 by Harlow Ohms, RN  Positioning for Skin: Supine/Back  Device Skin Pressure Protection: absorbent pad utilized/changed  Intervention: Prevent and Manage VTE (Venous Thromboembolism) Risk  Recent Flowsheet Documentation  Taken 10/06/2022 0200 by Harlow Ohms, RN  Anti-Embolism Intervention: (VTE : Heparin SQ) Other (Comment)  Taken 10/06/2022 0000 by Harlow Ohms, RN  Anti-Embolism Intervention: (VTE : Heparin SQ) Other (Comment)  Taken 10/05/2022 2200 by Annamaria Helling A, RN  Anti-Embolism Intervention: (VTE : Heparin SQ) Other (Comment)  Taken 10/05/2022 2000 by Harlow Ohms, RN  Anti-Embolism Intervention: (VTE : Heparin SQ) Other (Comment)  Goal: Optimal Comfort and Wellbeing  Outcome: Progressing  Goal: Readiness for Transition of Care  Outcome: Progressing  Goal: Rounds/Family Conference  Outcome: Progressing

## 2022-10-07 LAB — BASIC METABOLIC PANEL
ANION GAP: 11 mmol/L (ref 5–14)
BLOOD UREA NITROGEN: 53 mg/dL — ABNORMAL HIGH (ref 9–23)
BUN / CREAT RATIO: 27
CALCIUM: 9.1 mg/dL (ref 8.7–10.4)
CHLORIDE: 108 mmol/L — ABNORMAL HIGH (ref 98–107)
CO2: 17 mmol/L — ABNORMAL LOW (ref 20.0–31.0)
CREATININE: 1.93 mg/dL — ABNORMAL HIGH
EGFR CKD-EPI (2021) FEMALE: 27 mL/min/{1.73_m2} — ABNORMAL LOW (ref >=60)
GLUCOSE RANDOM: 113 mg/dL (ref 70–179)
POTASSIUM: 4.9 mmol/L — ABNORMAL HIGH (ref 3.4–4.8)
SODIUM: 136 mmol/L (ref 135–145)

## 2022-10-07 LAB — BLOOD GAS CRITICAL CARE PANEL, VENOUS
BASE EXCESS VENOUS: -6.7 — ABNORMAL LOW (ref -2.0–2.0)
CALCIUM IONIZED VENOUS (MG/DL): 4.89 mg/dL (ref 4.40–5.40)
GLUCOSE WHOLE BLOOD: 205 mg/dL — ABNORMAL HIGH (ref 70–179)
HCO3 VENOUS: 19 mmol/L — ABNORMAL LOW (ref 22–27)
HEMOGLOBIN BLOOD GAS: 7.8 g/dL — ABNORMAL LOW
LACTATE BLOOD VENOUS: 0.9 mmol/L (ref 0.5–1.8)
O2 SATURATION VENOUS: 84.3 % (ref 40.0–85.0)
PCO2 VENOUS: 27 mmHg — ABNORMAL LOW (ref 40–60)
PH VENOUS: 7.41 (ref 7.32–7.43)
PO2 VENOUS: 53 mmHg (ref 30–55)
POTASSIUM WHOLE BLOOD: 4.6 mmol/L (ref 3.4–4.6)
SODIUM WHOLE BLOOD: 131 mmol/L — ABNORMAL LOW (ref 135–145)

## 2022-10-07 LAB — CBC
HEMATOCRIT: 29.4 % — ABNORMAL LOW (ref 34.0–44.0)
HEMOGLOBIN: 10 g/dL — ABNORMAL LOW (ref 11.3–14.9)
MEAN CORPUSCULAR HEMOGLOBIN CONC: 34 g/dL (ref 32.0–36.0)
MEAN CORPUSCULAR HEMOGLOBIN: 32.4 pg (ref 25.9–32.4)
MEAN CORPUSCULAR VOLUME: 95.3 fL (ref 77.6–95.7)
MEAN PLATELET VOLUME: 9.2 fL (ref 6.8–10.7)
PLATELET COUNT: 65 10*9/L — ABNORMAL LOW (ref 150–450)
RED BLOOD CELL COUNT: 3.08 10*12/L — ABNORMAL LOW (ref 3.95–5.13)
RED CELL DISTRIBUTION WIDTH: 16.5 % — ABNORMAL HIGH (ref 12.2–15.2)
WBC ADJUSTED: 6.7 10*9/L (ref 3.6–11.2)

## 2022-10-07 LAB — IRON PANEL
IRON SATURATION: 20 % (ref 20–55)
IRON: 34 ug/dL — ABNORMAL LOW
TOTAL IRON BINDING CAPACITY: 173 ug/dL — ABNORMAL LOW (ref 250–425)

## 2022-10-07 LAB — MAGNESIUM: MAGNESIUM: 1.7 mg/dL (ref 1.6–2.6)

## 2022-10-07 LAB — LIPASE: LIPASE: 20 U/L (ref 12–53)

## 2022-10-07 MED ADMIN — meropenem (MERREM) 1 g in sodium chloride 0.9 % (NS) 100 mL IVPB-MBP: 1 g | INTRAVENOUS | @ 17:00:00 | Stop: 2022-10-11

## 2022-10-07 MED ADMIN — sodium bicarbonate tablet 650 mg: 650 mg | ORAL | @ 13:00:00

## 2022-10-07 MED ADMIN — tacrolimus (PROGRAF) capsule 2 mg: 2 mg | ORAL | @ 02:00:00

## 2022-10-07 MED ADMIN — tacrolimus (PROGRAF) capsule 2 mg: 2 mg | ORAL

## 2022-10-07 MED ADMIN — acetaminophen (TYLENOL) tablet 650 mg: 650 mg | ORAL | @ 04:00:00

## 2022-10-07 MED ADMIN — acetaminophen (TYLENOL) tablet 650 mg: 650 mg | ORAL | @ 10:00:00 | Stop: 2022-10-07

## 2022-10-07 MED ADMIN — insulin lispro (HumaLOG) injection 0-20 Units: 0-20 [IU] | SUBCUTANEOUS | @ 02:00:00

## 2022-10-07 MED ADMIN — hydrALAZINE (APRESOLINE) tablet 100 mg: 100 mg | ORAL | @ 10:00:00 | Stop: 2022-10-07

## 2022-10-07 MED ADMIN — hydrALAZINE (APRESOLINE) tablet 100 mg: 100 mg | ORAL | @ 04:00:00

## 2022-10-07 MED ADMIN — meropenem (MERREM) 1 g in sodium chloride 0.9 % (NS) 100 mL IVPB-MBP: 1 g | INTRAVENOUS | @ 02:00:00 | Stop: 2022-10-11

## 2022-10-07 MED ADMIN — aluminum-magnesium hydroxide-simethicone (MAALOX MAX) 80-80-8 mg/mL oral suspension: 30 mL | ORAL | @ 08:00:00 | Stop: 2022-10-07

## 2022-10-07 MED ADMIN — magnesium oxide-Mg AA chelate (Magnesium Plus Protein) 2 tablet: 2 | ORAL | @ 02:00:00

## 2022-10-07 MED ADMIN — heparin (porcine) 5,000 unit/mL injection 5,000 Units: 5000 [IU] | SUBCUTANEOUS | @ 02:00:00

## 2022-10-07 MED ADMIN — HYDROmorphone (PF) injection Syrg 0.25 mg: .25 mg | INTRAVENOUS | Stop: 2022-10-21

## 2022-10-07 MED ADMIN — tacrolimus (PROGRAF) capsule 3 mg: 3 mg | ORAL | @ 13:00:00

## 2022-10-07 MED ADMIN — cloNIDine HCL (CATAPRES) tablet 0.1 mg: .1 mg | ORAL | @ 02:00:00

## 2022-10-07 MED ADMIN — heparin (porcine) 5,000 unit/mL injection 5,000 Units: 5000 [IU] | SUBCUTANEOUS | @ 17:00:00

## 2022-10-07 MED ADMIN — predniSONE (DELTASONE) tablet 5 mg: 5 mg | ORAL | @ 13:00:00

## 2022-10-07 MED ADMIN — sodium chloride (NS) 0.9 % infusion: 100 mL/h | INTRAVENOUS | @ 19:00:00 | Stop: 2022-10-08

## 2022-10-07 MED ADMIN — HYDROmorphone (PF) injection Syrg 0.25 mg: .25 mg | INTRAVENOUS | @ 19:00:00 | Stop: 2022-10-07

## 2022-10-07 MED ADMIN — insulin lispro (HumaLOG) injection 0-20 Units: 0-20 [IU] | SUBCUTANEOUS | @ 22:00:00

## 2022-10-07 MED ADMIN — labetalol (NORMODYNE) injection: 10 mg | INTRAVENOUS | @ 23:00:00

## 2022-10-07 MED ADMIN — insulin glargine (LANTUS) injection 6 Units: 6 [IU] | SUBCUTANEOUS | @ 02:00:00

## 2022-10-07 MED ADMIN — sodium bicarbonate tablet 650 mg: 650 mg | ORAL | @ 02:00:00

## 2022-10-07 MED ADMIN — levothyroxine (SYNTHROID) tablet 112 mcg: 112 ug | ORAL | @ 10:00:00

## 2022-10-07 MED ADMIN — cloNIDine HCL (CATAPRES) tablet 0.1 mg: .1 mg | ORAL | @ 13:00:00 | Stop: 2022-10-07

## 2022-10-07 MED ADMIN — HYDROmorphone (PF) injection Syrg 0.5 mg: .5 mg | INTRAVENOUS | @ 10:00:00 | Stop: 2022-10-07

## 2022-10-07 MED ADMIN — valGANciclovir (VALCYTE) tablet 450 mg: 450 mg | ORAL | @ 13:00:00

## 2022-10-07 MED ADMIN — magnesium oxide-Mg AA chelate (Magnesium Plus Protein) 2 tablet: 2 | ORAL | @ 13:00:00

## 2022-10-07 MED ADMIN — simethicone (MYLICON) chewable tablet 80 mg: 80 mg | ORAL | @ 16:00:00

## 2022-10-07 MED ADMIN — ondansetron (ZOFRAN) injection 4 mg: 4 mg | INTRAVENOUS | @ 19:00:00

## 2022-10-07 MED ADMIN — heparin (porcine) 5,000 unit/mL injection 5,000 Units: 5000 [IU] | SUBCUTANEOUS | @ 10:00:00

## 2022-10-07 MED ADMIN — insulin lispro (HumaLOG) injection 0-20 Units: 0-20 [IU] | SUBCUTANEOUS | @ 16:00:00

## 2022-10-07 MED ADMIN — pantoprazole (Protonix) EC tablet 40 mg: 40 mg | ORAL | @ 13:00:00

## 2022-10-07 MED ADMIN — atorvastatin (LIPITOR) tablet 10 mg: 10 mg | ORAL | @ 13:00:00

## 2022-10-07 NOTE — Unmapped (Signed)
Social Work  Psychosocial Assessment    Patient Name: Julie [name]   Medical Record Number: [id number]   Date of Birth: 1950/08/06  Sex: Female     Referral  Referred by: Care Manager  Reason for Referral: Complex Discharge Planning  No Psychosocial Interventions Necessary: No Psychosocial Interventions Necessary    Extended Emergency Contact Information  Primary Emergency Contact: Super,Christie  Mobile Phone: [phone]  Relation: Daughter  Secondary Emergency Contact: Gwenlyn [place]  Mobile Phone: [phone]  Relation: Spouse    Legal Next of Kin / Guardian / POA / Advance Directives    HCDM (patient stated preference) (Active): Hazan,[name] - Spouse - [phone]    Advance Directive (Medical Treatment)  Does patient have an advance directive covering medical treatment?: Patient does not have advance directive covering medical treatment.  Reason patient does not have an advance directive covering medical treatment:: Patient needs follow-up to complete one.    Health Care Decision Maker [HCDM] (Medical & Mental Health Treatment)  Healthcare Decision Maker: HCDM documented in the HCDM/Contact Info section.  Information offered on HCDM, Medical & Mental Health advance directives:: Patient given information.    Advance Directive (Mental Health Treatment)  Does patient have an advance directive covering mental health treatment?: Patient does not have advance directive covering mental health treatment.  Reason patient does not have an advance directive covering mental health treatment:: Patient needs follow-up to complete one.    Discharge Planning  Discharge Planning Information:   Type of Residence   Mailing Address:  [address] [place]  [place] [address]    Medical Information   Past Medical History:   Diagnosis Date    Anemia     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)     since July 2017    Hypertension     Hypothyroidism (acquired)     Kidney transplant status, cadaveric 08/08/2022    Type 2 diabetes mellitus (CMS-HCC)        Past Surgical History:   Procedure Laterality Date    CATARACT EXTRACTION      HYSTERECTOMY      OOPHORECTOMY      PERITONEAL CATHETER INSERTION      PR LAP INSERTION TUNNELED INTRAPERITONEAL CATHETER N/A 09/27/2018    Procedure: LAPAROSCOPY, SURGICAL; WITH INSERTION OF INTRAPERITONEAL CANNULA OR CATHETER, PERMANENT;  Surgeon: Leona Carry, MD;  Location: MAIN OR [place];  Service: Transplant    PR LAP INSERTION TUNNELED INTRAPERITONEAL CATHETER N/A 04/08/2019    Procedure: LAPAROSCOPY, SURGICAL; WITH INSERTION OF INTRAPERITONEAL CANNULA OR CATHETER, PERMANENT;  Surgeon: Leona Carry, MD;  Location: [facility] OR [facility];  Service: Transplant    PR LAP REVISE INTRAPERITONEAL CATHETER N/A 08/26/2019    Procedure: LAPAROSCOPY, SURGICAL; W/REVIS PREV PLACED INTRAPERITONEAL CANNULA/CATH, REMOV INTRALUMIN OBSTRUCT MATERIAL;  Surgeon: Leona Carry, MD;  Location: MAIN OR [facility];  Service: Transplant    PR REMOVAL TUNNELED INTRAPERITONEAL CATHETER N/A 05/06/2018    Procedure: REMOVAL OF PERMANENT INTRAPERITONEAL CANNULA OR CATHETER;  Surgeon: Leona Carry, MD;  Location: MAIN OR [facility];  Service: Transplant    PR REMOVE PERITONEAL FOREIGN BODY N/A 04/08/2019    Procedure: Removal Of Peritoneal Of Foreign Body From Peritoneal Cavity;  Surgeon: Leona Carry, MD;  Location: MAIN OR [facility];  Service: Transplant    PR TRANSPLANT,PREP RENAL GRAFT/ARTERIAL Right 08/06/2022    Procedure: [facility] RECONSTRUCTION CADAVER/LIVING DONOR RENAL ALLOGRAFT PRIOR TO TRANSPLANT; ARTERIAL ANASTOMOSIS EAC;  Surgeon: Norma Fredrickson, Lilyan Punt, MD;  Location: MAIN OR [place];  Service: Transplant    PR TRANSPLANTATION OF KIDNEY Right 08/06/2022    Procedure: RENAL ALLOTRANSPLANTATION, IMPLANTATION OF GRAFT; WITHOUT RECIPIENT NEPHRECTOMY;  Surgeon: Toledo, Lilyan Punt, MD;  Location: MAIN OR [facility];  Service: Transplant    TUBAL LIGATION         Family History   Problem Relation Age of Onset    Diabetes Mother     Alzheimer's disease Father     Diabetes Sister     Diabetes Sister     Diabetes Sister     Anesthesia problems Neg Hx     Bleeding Disorder Neg Hx        Financial Information   Primary Insurance: Payor: [employer] MEDICARE ADV / Plan: HU[employer] GOLD PLUS HMO / Product Type: *No Product type* /    Secondary Insurance: None   Prescription Coverage: Medicare D     Preferred Pharmacy: [id number] - [place], [place] - [address]  [place] [facility]  [facility]  [facility]    Barriers to taking medication: No    Transition Home   Transportation at time of discharge: Family/Friend's Private Vehicle    Anticipated changes related to Illness:  T BD   Services in place prior to admission: N/A   Services anticipated for DC: Home Based Services: resumption of HH   Hemodialysis Prior to Admission: No    Readmission  Risk of Unplanned Readmission Score: UNPLANNED READMISSION SCORE: 33.04%  Readmitted Within the Last 30 Days?   Readmission Factors include: other: w/in 1 year of txp    Social Determinants of Health  Social Determinants of Health were addressed in provider documentation.  Please refer to patient history.    Social History  Support Systems/Concerns: Spouse, Children, Family Members                          Hotel manager Service: No Field seismologist Affecting Healthcare: none    Medical and Psychiatric History  Psychosocial Stressors: Denies      Psychological Issues/Information: Cognitive impairment suspected              Chemical Dependency: None              Outpatient Providers: Specialist   Name / Contact #: : [facility]  Legal: No legal issues      Ability to [employer]: No issues accessing community services    Stopped by pt room but pt was sleeping and no family present.  Will try again at next opportunity.    Lowella Petties, LCSW, CCTSW

## 2022-10-07 NOTE — Unmapped (Signed)
VENOUS ACCESS ULTRASOUND PROCEDURE NOTE    Indications:   Poor venous access.    The Venous Access Team has assessed this patient for the placement of a PIV. Ultrasound guidance was necessary to obtain access.     Procedure Details:  Identity of the patient was confirmed via name, medical record number and date of birth. The availability of the correct equipment was verified.    The vein was identified for ultrasound catheter insertion.  Field was prepared with necessary supplies and equipment.  Probe cover and sterile gel utilized.  Insertion site was prepped with chlorhexidine solution and allowed to dry.  The catheter extension was primed with normal saline. A(n) 22 gauge 1.75" catheter was placed in the R Forearm with 1attempt(s). See LDA for additional details.    Catheter aspirated, 3 mL blood return present. The catheter was then flushed with 10 mL of normal saline. Insertion site cleansed, and dressing applied per manufacturer guidelines. The catheter was inserted with difficulty due to poor vasculature by Bryon Parker Joy M Steffon Gladu, RN.      RN was notified.     Thank you,     Ermine Spofford Joy M Zinnia Tindall, RN Venous Access Team   [phone]     Workup / Procedure Time:  30 minutes    See images below:

## 2022-10-08 LAB — BASIC METABOLIC PANEL
ANION GAP: 10 mmol/L (ref 5–14)
ANION GAP: 8 mmol/L (ref 5–14)
BLOOD UREA NITROGEN: 56 mg/dL — ABNORMAL HIGH (ref 9–23)
BLOOD UREA NITROGEN: 56 mg/dL — ABNORMAL HIGH (ref 9–23)
BUN / CREAT RATIO: 29
BUN / CREAT RATIO: 30
CALCIUM: 8.8 mg/dL (ref 8.7–10.4)
CALCIUM: 8.9 mg/dL (ref 8.7–10.4)
CHLORIDE: 108 mmol/L — ABNORMAL HIGH (ref 98–107)
CHLORIDE: 105 mmol/L (ref 98–107)
CO2: 19 mmol/L — ABNORMAL LOW (ref 20.0–31.0)
CO2: 20 mmol/L (ref 20.0–31.0)
CREATININE: 1.92 mg/dL — ABNORMAL HIGH
CREATININE: 1.87 mg/dL — ABNORMAL HIGH
EGFR CKD-EPI (2021) FEMALE: 27 mL/min/{1.73_m2} — ABNORMAL LOW (ref >=60)
EGFR CKD-EPI (2021) FEMALE: 28 mL/min/{1.73_m2} — ABNORMAL LOW (ref >=60)
GLUCOSE RANDOM: 116 mg/dL (ref 70–179)
GLUCOSE RANDOM: 189 mg/dL — ABNORMAL HIGH (ref 70–179)
POTASSIUM: 4.4 mmol/L (ref 3.4–4.8)
POTASSIUM: 5 mmol/L — ABNORMAL HIGH (ref 3.4–4.8)
SODIUM: 137 mmol/L (ref 135–145)
SODIUM: 133 mmol/L — ABNORMAL LOW (ref 135–145)

## 2022-10-08 LAB — CBC
HEMATOCRIT: 24.3 % — ABNORMAL LOW (ref 34.0–44.0)
HEMOGLOBIN: 8.3 g/dL — ABNORMAL LOW (ref 11.3–14.9)
MEAN CORPUSCULAR HEMOGLOBIN CONC: 34 g/dL (ref 32.0–36.0)
MEAN CORPUSCULAR HEMOGLOBIN: 32.1 pg (ref 25.9–32.4)
MEAN CORPUSCULAR VOLUME: 94.5 fL (ref 77.6–95.7)
MEAN PLATELET VOLUME: 9 fL (ref 6.8–10.7)
PLATELET COUNT: 65 10*9/L — ABNORMAL LOW (ref 150–450)
RED BLOOD CELL COUNT: 2.58 10*12/L — ABNORMAL LOW (ref 3.95–5.13)
RED CELL DISTRIBUTION WIDTH: 16.1 % — ABNORMAL HIGH (ref 12.2–15.2)
WBC ADJUSTED: 3.4 10*9/L — ABNORMAL LOW (ref 3.6–11.2)

## 2022-10-08 LAB — TACROLIMUS LEVEL, TROUGH: TACROLIMUS, TROUGH: 9.5 ng/mL (ref 5.0–15.0)

## 2022-10-08 LAB — LACTATE, VENOUS, WHOLE BLOOD
LACTATE BLOOD VENOUS: 1 mmol/L (ref 0.5–1.8)
LACTATE BLOOD VENOUS: 0.8 mmol/L (ref 0.5–1.8)

## 2022-10-08 LAB — MAGNESIUM: MAGNESIUM: 1.8 mg/dL (ref 1.6–2.6)

## 2022-10-08 MED ADMIN — heparin (porcine) 5,000 unit/mL injection 5,000 Units: 5000 [IU] | SUBCUTANEOUS | @ 01:00:00

## 2022-10-08 MED ADMIN — meropenem (MERREM) 1 g in sodium chloride 0.9 % (NS) 100 mL IVPB-MBP: 1 g | INTRAVENOUS | @ 01:00:00 | Stop: 2022-10-11

## 2022-10-08 MED ADMIN — acetaminophen (OFIRMEV) 10 mg/mL injection 650 mg 65 mL: 650 mg | INTRAVENOUS | @ 08:00:00 | Stop: 2022-10-08

## 2022-10-08 MED ADMIN — cloNIDine HCL (CATAPRES) tablet 0.1 mg: .1 mg | ORAL | @ 13:00:00 | Stop: 2022-10-08

## 2022-10-08 MED ADMIN — HYDROmorphone (PF) injection Syrg 0.25 mg: .25 mg | INTRAVENOUS | @ 18:00:00 | Stop: 2022-10-21

## 2022-10-08 MED ADMIN — tacrolimus (PROGRAF) capsule 3 mg: 3 mg | ORAL | @ 13:00:00 | Stop: 2022-10-08

## 2022-10-08 MED ADMIN — sodium bicarbonate tablet 650 mg: 650 mg | ORAL | @ 13:00:00 | Stop: 2022-10-08

## 2022-10-08 MED ADMIN — amlodipine (NORVASC) tablet 5 mg: 5 mg | ORAL | @ 09:00:00 | Stop: 2022-10-08

## 2022-10-08 MED ADMIN — insulin glargine (LANTUS) injection 6 Units: 6 [IU] | SUBCUTANEOUS | @ 01:00:00

## 2022-10-08 MED ADMIN — mycophenolate (CELLCEPT) 250 mg in dextrose 5 % 100 mL IVPB: 250 mg | INTRAVENOUS | @ 16:00:00

## 2022-10-08 MED ADMIN — predniSONE (DELTASONE) tablet 5 mg: 5 mg | ORAL | @ 13:00:00 | Stop: 2022-10-08

## 2022-10-08 MED ADMIN — acetaminophen (OFIRMEV) 10 mg/mL injection 650 mg 65 mL: 650 mg | INTRAVENOUS | @ 05:00:00 | Stop: 2022-10-08

## 2022-10-08 MED ADMIN — sodium chloride 0.9 % with KCl 20 mEq/L infusion: 100 mL/h | INTRAVENOUS | @ 18:00:00 | Stop: 2022-10-08

## 2022-10-08 MED ADMIN — sodium bicarbonate tablet 650 mg: 650 mg | ORAL | @ 01:00:00

## 2022-10-08 MED ADMIN — sodium chloride (NS) 0.9 % infusion: 100 mL/h | INTRAVENOUS | @ 21:00:00 | Stop: 2022-10-09

## 2022-10-08 MED ADMIN — atorvastatin (LIPITOR) tablet 10 mg: 10 mg | ORAL | @ 13:00:00 | Stop: 2022-10-08

## 2022-10-08 MED ADMIN — carvedilol (COREG) tablet 12.5 mg: 12.5 mg | ORAL | @ 13:00:00 | Stop: 2022-10-08

## 2022-10-08 MED ADMIN — pantoprazole (Protonix) EC tablet 40 mg: 40 mg | ORAL | @ 13:00:00 | Stop: 2022-10-08

## 2022-10-08 MED ADMIN — labetalol (NORMODYNE) injection: 10 mg | INTRAVENOUS | @ 05:00:00 | Stop: 2022-10-08

## 2022-10-08 MED ADMIN — amlodipine (NORVASC) tablet 5 mg: 5 mg | ORAL | @ 22:00:00 | Stop: 2022-10-08

## 2022-10-08 MED ADMIN — magnesium oxide-Mg AA chelate (Magnesium Plus Protein) 2 tablet: 2 | ORAL | @ 01:00:00

## 2022-10-08 MED ADMIN — meropenem (MERREM) 1 g in sodium chloride 0.9 % (NS) 100 mL IVPB-MBP: 1 g | INTRAVENOUS | @ 13:00:00 | Stop: 2022-10-11

## 2022-10-08 MED ADMIN — heparin (porcine) 5,000 unit/mL injection 5,000 Units: 5000 [IU] | SUBCUTANEOUS | @ 09:00:00

## 2022-10-08 MED ADMIN — carvedilol (COREG) tablet 12.5 mg: 12.5 mg | ORAL | @ 16:00:00 | Stop: 2022-10-08

## 2022-10-08 MED ADMIN — heparin (porcine) 5,000 unit/mL injection 5,000 Units: 5000 [IU] | SUBCUTANEOUS | @ 18:00:00

## 2022-10-08 MED ADMIN — insulin lispro (HumaLOG) injection 0-20 Units: 0-20 [IU] | SUBCUTANEOUS | @ 22:00:00

## 2022-10-08 MED ADMIN — levothyroxine (SYNTHROID) tablet 112 mcg: 112 ug | ORAL | @ 09:00:00 | Stop: 2022-10-08

## 2022-10-08 MED ADMIN — ondansetron (ZOFRAN) injection 4 mg: 4 mg | INTRAVENOUS | @ 18:00:00

## 2022-10-08 MED ADMIN — insulin lispro (HumaLOG) injection 0-20 Units: 0-20 [IU] | SUBCUTANEOUS | @ 01:00:00

## 2022-10-08 MED ADMIN — magnesium oxide-Mg AA chelate (Magnesium Plus Protein) 2 tablet: 2 | ORAL | @ 13:00:00 | Stop: 2022-10-08

## 2022-10-08 NOTE — Unmapped (Signed)
IMMUNOCOMPROMISED HOST INFECTIOUS DISEASE CONSULT NOTE    Julie [name] is being seen in consultation at the request of [name], MD for evaluation of UTI in transplant patient.    Assessment/Recommendations:    Julie [name] is a 72 y.o. 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     Summary of pertinent prior infections:  #History of Shingles 2017  #04/2018 Dialysis fluid [place] R: Ampicillin, Unasyn, Cefazolin,S: Ceftriaxone, Gentamicin, Levo, PipTazo, Tobra  #Donor urine cx with C. Dubliensis 08/05/22, <10K, negative recipient samples, negative donor blood cx, treated with 2 weeks PO fluconazole  #E. Coli bacteremia and UTI 08/21/22  - Ucx/Bcx 1/29 E. Coli, R amp, amp/sulb, TMP/SMX. Susc CFZ, cipro/levo, gent/tobra, nitrofurantoin, tetra  - Treated with vanc/CFP -> 3 weeks levofloxacin through 2/26 (ureteral stent removed 2/15)     Active infection:  # E. coli bacteremia 10/04/22, likely urinary source   # Perinephric fluid collection 10/05/22  - 3/12 developed fever and malaise at home, per family possible poor PO intake, diarrhea as well  - 3/14 UA 26 WBC, Ucx 10-50K E. coli  - 3/14 Renal US 8.1 x 0.8 x 2.3 cm perinephric collection new from prior, considering small hematoma, complicated seroma, or less likely abscess  - 3/14 Bcx 2/2 E. Coli, pending susc  - 3/14 CT A/P without contrast: perinephric stranding, 5.3 x 2.0 cm ill-defined perinephric collection limited evaluation without contrast. No stones seen. 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 Coli bacteremia presumed 2/2 UTI in last 6 weeks would be concerned about persistent nidus of infection, such as abscess. [place] and CT with new perinephric collection that doesn't appear classic for abscess, but would be concerned given infection hx and the collection appearing this far after transplant. Appreciate transplant surgery input, particularly regarding safety of sampling this fluid collection    Diagnosis  Follow-up repeat Bcx 3/16 to ensure clearance (particularly with possible niduses of infection such as abscess, LUE graft, etc.)  Follow-up Ucx added susceptibilities from 3/14 for possible suppressive options in the future  Would preferably pursue sampling of perinephric fluid collection given new from prior study and pt with 2 episodes of UTI with bacteremia - appreciate  discussion with radiology and IR  Will discuss with transplant team further evaluation for retention / urologic abnormalities that could be contributing to her recurrent UTIs     Management  Continue meropenem, renally dosed equivalent of 1g q8h, for now    Antimicrobial prophylaxis required for host deficiency: transplant immunosuppression  Viral: Valganciclovir  PJP: TMP/SMX MWF    Intensive toxicity monitoring for prescription antimicrobials   CBC w/diff at least once per week  CMP at least once per week  clinical assessments for rashes or other skin changes    The [facility] ID service will continue to follow.           Please page the ID Transplant/Liquid Oncology Fellow consult at [phone] with questions.  Patient discussed with Dr. [name].    [name], MD  [facility] Division of Infectious Diseases    History of Present Illness:      External record(s): Primary team note: NGT in place and monitoring SBO Consultant note(s): Surgery, supportive care for SBO .    Independent historian(s): no independent historian required.       Interval hx:  Increasing abdominal pain/distention and nausea on 3/15, c/f ileus and started with NGT. CT obtained with new SBO, surgery consulted and no surgical intervention at this time. Otherwise remains afebrile, vitals wnl. Pt reports abd pain, no further diarrhea.      Vital Signs last 24 hours:  Temp:  [36.5 ??C (97.7 ??F)-37.4 ??C (99.4 ??F)] 36.5 ??C (97.7 ??F)  Heart Rate:  [72-77] 76  SpO2 Pulse:  [72-77] 76  Resp:  [15-29] 17  BP: (122-209)/(24-55) 151/27  MAP (mmHg):  [58-105] 73  SpO2:  [94 %-100 %] 95 %    Physical Exam:  Patient Lines/Drains/Airways Status       Active Active Lines, Drains, & Airways       Name Placement date Placement time Site Days    NG/OG Tube Decompression 16 Fr. Right nostril 10/07/22  1300  Right nostril  1    Peripheral IV 10/07/22 Anterior;Right Forearm 10/07/22  0420  Forearm  1    Arteriovenous Fistula - Vein Graft  Access 08/24/16 Left;Upper Arm 08/24/16  --  Arm  2236                    Const [x]  vital signs above    []  NAD, non-toxic appearance []  Chronically ill-appearing, non-distressed  Sitting in chair, awake/alert      Eyes [x]  Lids normal bilaterally, conjunctiva anicteric and noninjected OU     [] PERRL  [] EOMI        ENMT [x]  Normal appearance of external nose and ears, no nasal discharge        [x]  MMM, no lesions on lips or gums []  No thrush, leukoplakia, oral lesions  []  Dentition good []  Edentulous []  Dental caries present  []  Hearing normal  []  TMs with good light reflexes bilaterally   NGT in place      Neck [x]  Neck of normal appearance and trachea midline        []  No thyromegaly, nodules, or tenderness   []  Full neck ROM  Supple      Lymph [x]  No LAD in neck     []  No LAD in supraclavicular area     []  No LAD in axillae   []  No LAD in epitrochlear chains     []  No LAD in inguinal areas        CV [  x] RRR            []  No peripheral edema     []  Pedal pulses intact   []  No abnormal heart sounds appreciated   []  Extremities WWP   II/VI murmur LUSB      Resp []  Normal WOB at rest    []  No breathlessness with speaking, no coughing  []  CTA anteriorly    []  CTA posteriorly    Slightly tachypneic  Scattered crackles anteriorly      GI [x]  Normal inspection, NTND   []  NABS     []  No umbilical hernia on exam       []  No hepatosplenomegaly     []  Inspection of perineal and perianal areas normal  Moderately distended and generally tender      GU []  Normal external genitalia     [] No urinary catheter present in urethra   []  No CVA tenderness    []  No tenderness over renal allograft        MSK []  No clubbing or cyanosis of hands       []  No vertebral point tenderness  []  No focal tenderness or abnormalities on palpation of joints in RUE, LUE, RLE, or LLE        Skin [x]  No rashes, lesions, or ulcers of visualized skin     []  Skin warm and dry to palpation   LUE prior graft in place with thrill      Neuro [x]  Face expression symmetric  []  Sensation to light touch grossly intact throughout    [x]  Moves extremities equally    []  No tremor noted        []  CNs II-XII grossly intact     []  DTRs normal and symmetric throughout []  Gait unremarkable        Psych []  Appropriate affect       []  Fluent speech         []  Attentive, good eye contact  []  Oriented to person, place, time          []  Judgment and insight are appropriate   Alert, speech normal        Data for Medical Decision Making     (3/14) EKG QTcF 438  I discussed mgm't w/qualified health care professional(s) involved in case: primary team regarding abx .    I reviewed CBC results (WBC3.4, slightly decreased), chemistry results (Cr 1.92, stable for pt), and micro result(s) (Bcx 3/15 2/2 E. Coli, I CFP, susc erta/mero).    I independently visualized/interpreted not done.       Recent Labs   Lab Units 10/08/22  0833 10/07/22  0622 10/06/22  0419 10/06/22  0242 10/05/22  1110   WBC 10*9/L 3.4*   < > 8.9   < > 10.6   HEMOGLOBIN g/dL 8.3*   < > 7.6*   < > 7.8*   PLATELET COUNT (1) 10*9/L 65*   < > 51*   < > 64*   NEUTRO ABS 10*9/L  --   --   --   --  10.1*   LYMPHO ABS 10*9/L  --   --   --   --  0.3*   EOSINO ABS 10*9/L  --   --   --   --  0.0   BUN mg/dL 56*   < > 45* - 45*   < > 48*   CREATININE mg/dL 6.21*   < > 3.08* - 6.57*   < >  1.79*   AST U/L  --   --  20   < > 21   ALT U/L  --   --  17   < > 19   BILIRUBIN TOTAL mg/dL  --   --  0.5   < > 0.4   ALK PHOS U/L  --   --  50   < > 43*   POTASSIUM WHOLE BLOOD   --    < > 4.9*   < >  --    POTASSIUM mmol/L 4.4   < > 4.9* - 4.9*   < > 5.4*   MAGNESIUM mg/dL 1.8   < > 1.5*   < > 1.7   PHOSPHORUS mg/dL  --   --   --   --  2.2*   CALCIUM mg/dL 8.8   < > 8.3* - 8.3*   < > 8.2*    < > = values in this interval not displayed.       Lab Results   Component Value Date    Tacrolimus, Trough 9.5 10/08/2022       Microbiology:  Microbiology Results (last day)       Procedure Component Value Date/Time Date/Time    C. Difficile Assay [[id number]]     Lab Status: No result Specimen: Stool      Blood Culture #1 [[id number]]  (Normal) Collected: 10/07/22 0620    Lab Status: Preliminary result Specimen: Blood from 1 Peripheral Draw Updated: 10/08/22 0645     Blood Culture, Routine No Growth at 24 hours    Blood Culture #2 [[id number]]  (Normal) Collected: 10/07/22 0624    Lab Status: Preliminary result Specimen: Blood from 1 Peripheral Draw Updated: 10/08/22 0645     Blood Culture, Routine No Growth at 24 hours            Imaging:  CT Abdomen Pelvis Wo Contrast    Result Date: 10/08/2022  EXAM: CT ABDOMEN PELVIS WO CONTRAST ACCESSION: [id number] UN CLINICAL INDICATION: 72 years old with Eval for obstruction      COMPARISON: CT abdomen pelvis 10/05/2022     TECHNIQUE: A spiral CT scan was obtained without IV contrast from the lung bases to the pubic symphysis.  Images were reconstructed in the axial plane. Coronal and sagittal reformatted images were also provided for further evaluation.     Evaluation of the solid organs and vasculature is limited in the absence of intravenous contrast.         FINDINGS:     LOWER CHEST: Trace right pleural effusion. Bibasilar atelectasis. Aortic valve and coronary artery calcifications.     LIVER: Normal liver contour. 1.1 cm posterior right hepatic lesion, most likely a benign cyst or biliary hamartoma (2:32). No suspicious focal liver lesion on non-contrast examination.     BILIARY: The gallbladder is normal in appearance. No intrahepatic biliary ductal dilatation.     SPLEEN: Unchanged posterior splenic cyst.     PANCREAS: Normal pancreatic contour without signs of inflammation or gross ductal dilatation.     ADRENAL GLANDS: Normal appearance of the adrenal glands.     KIDNEYS/URETERS: Atrophy of the bilateral native kidneys. Left lower quadrant transplant kidney. Similar appearance of mild enlargement of the transplant kidney with perinephric stranding/inflammation. Bilateral nonobstructing nephrolithiasis of the native kidneys. No ureteral dilatation or collecting system distention.     BLADDER: Unremarkable.     REPRODUCTIVE ORGANS: Uterus and ovaries are surgically absent.     GI TRACT: Enteric tube in  place which is coiled in the stomach with tip pointing towards the gastric fundus. There are multiple dilated fluid-filled loops of small bowel throughout the abdomen measuring up to 3.7 cm. There are multiple points of bowel caliber change centered in an area in the the right lower quadrant with localized mesenteric stranding/vascular congestion and free fluid (2:91). Evidence of bowel ischemia cannot be evaluated in the absence of intravenous contrast. Colon diverticulosis without evidence of diverticulitis. Normal appendix.     PERITONEUM/RETROPERITONEUM AND MESENTERY: No free air. Small volume abdominopelvic ascites, increased from prior exam. Mesenteric edema/stranding localized in the right lower quadrant. No organized fluid collection.     VASCULATURE: Normal caliber aorta. Otherwise, limited evaluation without contrast.     LYMPH NODES: No adenopathy.     BONES and SOFT TISSUES: No aggressive osseous lesions. Body wall edema.         Concern for developing high-grade small bowel obstruction due to adhesions.     Presence of bowel ischemia cannot be evaluated in the absence of intravenous contrast.     Consider surgical consultation.         XR Abdomen 1 View    Result Date: 10/07/2022  EXAM: XR ABDOMEN 1 VIEW ACCESSION: [id number] UN     CLINICAL INDICATION: 72 years old with INTESTINAL OBSTRUCTION      COMPARISON: 10/07/2022     TECHNIQUE: Supine view of the abdomen, 2 images(s)     FINDINGS: Slight increase in small bowel dilation, now 4.4 cm. Bowel gas is noted in a colonic loop in the midabdomen, presumably redundant sigmoid colon. Multiple surgical clips are noted in the left lower quadrant.         Increasing small bowel dilatation with gas filled loop of sigmoid colon. Findings suggest ileus related to left lower quadrant transplant. If abdominal pain persists or worsens, consider CT abdomen pelvis.         XR Abdomen 1 View    Result Date: 10/07/2022  EXAM: XR ABDOMEN 1 VIEW ACCESSION: [id number] UN     CLINICAL INDICATION: 72 years old with NGT (CATHETER NON VASC FIT & ADJ)      COMPARISON: Abdominal radiograph 10/07/2022     TECHNIQUE: Supine view of the abdomen, 1 images(s)     FINDINGS: Nonweighted enteric tube with redundant tubing coiled within the stomach, the tip and sideport overlie the left upper quadrant. Similar gaseous distention of the small bowel in the upper abdomen measuring up to 3.8 cm. Gas is also seen within nondistended colon. See same-day chest radiograph for evaluation of structures above the diaphragm.         --Nonweighted enteric tube with redundant tubing coiled within the stomach, the tip and sideport overlie the left upper quadrant, likely at the gastric fundus.     --Similar gas-filled mildly dilated loops of small bowel within the upper abdomen. Gas is also seen within nondistended large bowel. Findings can be seen in the setting of ileus.         XR Chest Portable    Result Date: 10/07/2022  EXAM: XR CHEST PORTABLE ACCESSION: [id number] UN     CLINICAL INDICATION: Dyspnea.     TECHNIQUE: Single View AP Chest Radiograph.     COMPARISON: Previous day 2:43.     FINDINGS:     No new consolidation, pleural fluid, or pneumothorax. Stable cardiomediastinal contour with calcified aorta. Low lung volumes. Vascular stent on the left. Equivocal mild interstitial edema accentuated by low volumes.  Possible mild interstitial edema accentuated by low lung volumes.    XR Abdomen 1 View    Result Date: 10/07/2022  EXAM: XR ABDOMEN 1 VIEW ACCESSION: [id number] UN     CLINICAL INDICATION: 72 years old with ABDOMINAL PAIN  -  UNSPECIFIED SITE      COMPARISON: 10/05/2022     TECHNIQUE: Supine view of the abdomen, 2 images(s)     FINDINGS: Increasing dilatation of small bowel loops now measuring up to 4.2 cm. Gas distended stomach. Gas is noted within the colon. Small colonic stool burden. Surgical clips overlie the left lower quadrant.         Increasing small bowel dilatation up to 4.2 cm with relative decompression of the colon. This could be due to ileus versus bowel obstruction. If abdominal pain persists or worsens, consider CT abdomen pelvis.         CT Abdomen Pelvis Wo Contrast    Result Date: 10/06/2022  EXAM: CT ABDOMEN PELVIS WO CONTRAST ACCESSION: [id number] UN CLINICAL INDICATION: 72 years old with GNR bacteremia, assessing for source of infection      COMPARISON: Renal transplant Doppler 10/05/2022, outside CT abdomen pelvis 12/06/2020     TECHNIQUE: A spiral CT scan was obtained without IV contrast from the lung bases to the pubic symphysis.  Images were reconstructed in the axial plane. Coronal and sagittal reformatted images were also provided for further evaluation.     Evaluation of the solid organs and vasculature is limited in the absence of intravenous contrast.         FINDINGS: LOWER CHEST: Bibasilar subsegmental atelectasis. Relative hyperattenuation of the left ventricular myocardium suggestive of anemia.     LIVER: Normal liver contour. No focal liver lesion on non-contrast examination.     BILIARY: The gallbladder is normal in appearance. No intrahepatic biliary ductal dilatation.     SPLEEN: Normal in size and contour. Small splenic cyst.     PANCREAS: Normal pancreatic contour without signs of inflammation or gross ductal dilatation.     ADRENAL GLANDS: Normal appearance of the adrenal glands.     KIDNEYS/URETERS: Left lower quadrant transplant kidney. The transplant kidney appears mildly enlarged with perinephric stranding/inflammation. The perinephric collection noted on recent ultrasound is not well delineated on this noncontrast exam. Bilateral nonobstructing nephrolithiasis in the native kidneys.  No ureteral dilatation or collecting system distention.     BLADDER: Unremarkable.     REPRODUCTIVE ORGANS: Surgically absent uterus and ovaries.     GI TRACT: No findings of bowel obstruction or acute inflammation.  Normal appendix. Minimal colonic diverticulosis.     PERITONEUM/RETROPERITONEUM AND MESENTERY: No free air. Small volume fluid layering in the pelvis. No fluid collection.     VASCULATURE: Normal caliber aorta. Severe aortoiliac atherosclerotic disease.     LYMPH NODES: No adenopathy.     BONES and SOFT TISSUES: No aggressive osseous lesions. No focal soft tissue lesions. Postsurgical scarring of the anterior abdominal wall. Multilevel degenerative changes of the spine. Post injection sequela of the abdominal wall.             Inflammatory changes of the left lower quadrant transplant kidney concerning for renal infection, possibly pyelonephritis but evaluation is limited on this noncontrast exam.     The transplant perinephric collection noted on recent ultrasound is not well delineated in the absence of intravenous contrast. ==================== ADDENDUM (10/06/2022 9:38 AM): On review, the following additional findings were noted:         1. Agree there  is enlargement of the transplant kidney within the left lower quadrant with moderate perinephric stranding. Additionally, along the posterior mid and inferior aspect of the renal transplant is a fluid collection measuring approximately 5.3 x 2.0 cm. Subcapsular versus perinephric fluid collection is limitedly assessed without contrast. Diagnostic considerations in the early post transplant state would include hematoma, seroma or possibly urinoma. There is no gas collection. However infection remains in the differential.    XR Chest Portable    Result Date: 10/06/2022  EXAM: XR CHEST PORTABLE ACCESSION: [id number] UN     CLINICAL INDICATION: SHORTNESS OF BREATH      TECHNIQUE: Single View AP Chest Radiograph.     COMPARISON: Prior day chest radiograph     FINDINGS:     Slightly improved interstitial pulmonary edema. No pleural effusion or pneumothorax.     Stable enlarged cardiac silhouette. Tortuous thoracic aorta with extensive calcifications. Unchanged left subclavian vascular stents.             Slightly improved interstitial pulmonary edema.

## 2022-10-08 NOTE — Unmapped (Signed)
Transplant Nephrology Consult Followup      Assessment/Recommendations: Julie [name] is a/an 72 y.o. 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 Developed ileus on hospital day 2 (3/16).     #Status post kidney transplant, Allograft Function (unstable), AKI:   She has mild AKI not far above baseline. Outpatient baseline creatinine 1.5-1.8, peak 2.1 on admission now stable at 1.9. She has difficulty maintaining adequate PO hydration chronically since transplant.    #Immunosuppression [High risk medical decision making for drug therapy requiring intensive monitoring for toxicity]  Adjusting IS plan today based on ileus with NGT suctioning.   - plan is to clamp the NGT for an hour twice daily to give PO meds BID  - tacrolimus: 3 mg AM, 2 mg PM, keep it PO and give during NGT clamp.  - last dose change 3/15 PM  - goal level 6-8   - resume mycophenolate at 250 mg BID but give it IV rather than PO due to NGT (home dose is 500 mg BID)    # Perinephric fluid collection  Potential for VIR to place drain for fluid sampling, pending further discussion with SRF    #  E.coli bacteremia of urinary source  Currently management is meropenem 3/14-present.  Hemodynamically improved and repeat BCx from 3/16 to follow up.      # Ileus  Diarrhea on presentation, no anti-motility agents were given, rapidly transitioned to ileus. No hypokalemia to explain intestinal dysmotility. Appreciate primary team's management. Currently starting NGT to low intermittent suction for decompression. Lactate reassuring.    # BP Management:  - Home regimen: amlodipine 10 mg daily, carvedilol 25 mg BID, clonidine 0.1 mg TID, hydralazine 100 mg TID, chorthalidone 25 mg daily, spironolactone 25 mg daily.  She has always had a very wide pulse pressure/low diastolics. Initially her home anti-HTN meds were held due to hypotension in setting of sepsis; now time to add some back.   - carvedilol 25 mg BID, keep this one PO and give POduring the BID clamping  - clonidine 0.1 mg, reduce to BID and give PO during the NGT clamping  - if persistently high can add back amlodipine as a PO (5 mg BID to help promote absorption)  - for a PRN (SBP persistently >165) would give labetalol 20 mg IV; ok to repeat if sustained HTN 30 min later  - we continue to hold the chlorthalidone, spironolactone and hydralazine (amlodipine currently held but would be quick to add back)    # Infectious Prophylaxis and Monitoring:   CMV D+/R+, EBV D+/R+  CMV ppx:  Valcyte x3 mo   PJP ppx: Bactrim x6 mo    Discussed recommendations with primary team.     Leafy Half, MD  Division of Nephrology and Hypertension  [facility]  10/07/2022      _____________________________________________________________________________________    Kidney Transplant History:   Date of Transplant: 08/06/2022 (Kidney)  Type of Transplant: DCD  KDPI: 56%  Cold ischemic time: 877 minutes (14 hr 37 min)  Warm ischemic time: 32 minutes  cPRA: 0%  HLA match:   Blood type: Donor A1, Recipient A POS  ID: CMV D+/R+, EBV D+/R+  Native Kidney Disease: Diabetes              Native kidney biopsy: Diabetic nephropathy (done  elsewhere)              Pre-transplant dialysis course: PD started July 2017, transitioned to HD October 2019 due to catheter dysfunction/inadequate dialysis  Prior Transplants: None  Induction: Basiliximab  Early steroid withdrawal: No    Subjective/Overnight events:  Abdominal distension with NGT placed due to ileus (KUBs and lactate reassuring against SBO this morning)      Medications:   Current Facility-Administered Medications   Medication Dose Route Frequency Provider Last Rate Last Admin    acetaminophen (OFIRMEV) 10 mg/mL injection 650 mg 65 mL  650 mg Intravenous Q6H PRN Valeta Harms, MD        [Provider Hold] amlodipine (NORVASC) tablet 10 mg  10 mg Oral Daily Gracelyn Nurse, MD   10 mg at 10/05/22 0930    atorvastatin (LIPITOR) tablet 10 mg  10 mg Oral Daily Gracelyn Nurse, MD   10 mg at 10/07/22 0855    carvedilol (COREG) tablet 12.5 mg  12.5 mg Oral BID Valeta Harms, MD        cloNIDine HCL (CATAPRES) tablet 0.1 mg  0.1 mg Oral BID Valeta Harms, MD        dextrose (D10W) 10% bolus 125 mL  12.5 g Intravenous Q10 Min PRN Gracelyn Nurse, MD        glucagon injection 1 mg  1 mg Intramuscular Once PRN Gracelyn Nurse, MD        glucose chewable tablet 16 g  16 g Oral Q10 Min PRN Gracelyn Nurse, MD        guaiFENesin (ROBITUSSIN) oral syrup  200 mg Oral Q4H PRN Gracelyn Nurse, MD        heparin (porcine) 5,000 unit/mL injection 5,000 Units  5,000 Units Subcutaneous [facility] Gracelyn Nurse, MD   5,000 Units at 10/07/22 2100    HYDROmorphone (PF) injection Syrg 0.25 mg  0.25 mg Intravenous Q4H PRN Valeta Harms, MD   0.25 mg at 10/07/22 1959    insulin glargine (LANTUS) injection 6 Units  6 Units Subcutaneous Nightly Gracelyn Nurse, MD   6 Units at 10/07/22 2040    insulin lispro (HumaLOG) injection 0-20 Units  0-20 Units Subcutaneous ACHS Gracelyn Nurse, MD   1 Units at 10/07/22 2057    labetalol (NORMODYNE) injection  10 mg Intravenous Q6H PRN Valeta Harms, MD   10 mg at 10/07/22 1914    levothyroxine (SYNTHROID) tablet 112 mcg  112 mcg Oral daily Gracelyn Nurse, MD   112 mcg at 10/07/22 1610    magnesium oxide-Mg AA chelate (Magnesium Plus Protein) 2 tablet  2 tablet Oral BID Daine Floras, MD   2 tablet at 10/07/22 2039    melatonin tablet 3 mg  3 mg Oral Nightly PRN Gracelyn Nurse, MD        meropenem (MERREM) 1 g in sodium chloride 0.9 % (NS) 100 mL IVPB-MBP  1 g Intravenous Q12H Valeta Harms, MD   Stopped at 10/07/22 2109    ondansetron (ZOFRAN-ODT) disintegrating tablet 4 mg  4 mg Oral Q8H PRN Gracelyn Nurse, MD        Or ondansetron [facility]) injection 4 mg  4 mg Intravenous Q8H PRN Gracelyn Nurse, MD   4 mg at 10/07/22 1514    pantoprazole (Protonix) EC tablet 40 mg  40 mg Oral Daily Gracelyn Nurse, MD   40 mg at 10/07/22 [phone]  predniSONE (DELTASONE) tablet 5 mg  5 mg Oral Daily Gracelyn Nurse, MD   5 mg at 10/07/22 0855    simethicone (MYLICON) chewable tablet 80 mg  80 mg Oral Q6H PRN Valeta Harms, MD   80 mg at 10/07/22 1200    sodium bicarbonate tablet 650 mg  650 mg Oral BID Daine Floras, MD   650 mg at 10/07/22 2039    sodium chloride (NS) 0.9 % infusion  100 mL/hr Intravenous Continuous Valeta Harms, MD 100 mL/hr at 10/07/22 1522 100 mL/hr at 10/07/22 1522    sulfamethoxazole-trimethoprim (BACTRIM) 400-80 mg tablet 80 mg of trimethoprim  1 tablet Oral Once per day on Monday Wednesday Friday Gracelyn Nurse, MD   80 mg of trimethoprim at 10/06/22 0845    tacrolimus (PROGRAF) capsule 2 mg  2 mg Oral Nightly Valeta Harms, MD   2 mg at 10/07/22 1959    tacrolimus (PROGRAF) capsule 3 mg  3 mg Oral Daily Valeta Harms, MD   3 mg at 10/07/22 1610    valGANciclovir (VALCYTE) tablet 450 mg  450 mg Oral Every other day Gracelyn Nurse, MD   450 mg at 10/07/22 9604        ALLERGIES  Oxycodone, Penicillins, and Valacyclovir    Review of Systems:  As per HPI, all other systems reviewed and negative    Physical Exam:  Vitals:    10/07/22 2030   BP: 122/47   Pulse: 74   Resp: 21   Temp:    SpO2: 95%     I/O this shift:  In: -   Out: 200 [Urine:200]    Intake/Output Summary (Last 24 hours) at 10/07/2022 2149  Last data filed at 10/07/2022 1930  Gross per 24 hour   Intake --   Output 1700 ml   Net -1700 ml       General: no acute distress. NGT in place.  HEENT: anicteric sclera  CV: no peripheral edema  Lungs: minimally increased work of breathing, [place] oxygen in place  Abdomen: distended, uncomfortable but not tender; bowel sounds absent.  Skin: no visible lesions or rashes      Test Results  Reviewed  Lab Results   Component Value Date    NA 131 (L) 10/07/2022    K 4.6 10/07/2022    CL 108 (H) 10/07/2022    CO2 17.0 (L) 10/07/2022    BUN 53 (H) 10/07/2022    CREATININE 1.93 (H) 10/07/2022    GLU 113 10/07/2022    CALCIUM 9.1 10/07/2022    ALBUMIN 3.3 (L) 10/06/2022    PHOS 2.2 (L) 10/05/2022

## 2022-10-08 NOTE — Unmapped (Signed)
New Surgery Consult Note      Requesting Attending Physician:  [name], MD  Service Requesting Consult:  [facility] (MD[facility])  Service Providing Consult: General surgery, [facility] consults  Consulting Attending: Dr. Bess Harvest    Assessment:  Patient is a 72 y.o. female with complicated previous medical history status post DCD DDKT in 07/2022, hypertension, type 2 diabetes, hypothyroidism, who was admitted for GNR bacteremia.  CT with dilated loops of small bowel with multiple points of caliber change in the RLQ with localized mesenteric stranding, NG tube placed.  General surgery consulted for recommendations.    Patient's abdomen is soft, mildly distended, mildly uncomfortable to palpation but overall nontender.  She has normal lactate.     Recommendations:   -Nonoperative management with n.p.o., NG tube to LIWS, IV fluids  -Serial abdominal exams  -No indications for acute surgical intervention.  Will continue to follow. Please reach out if clinical condition worsens.     This patient and plan was discussed with Dr. Bess Harvest    Please page [facility] pager [phone]) with any questions or concerns.    Vicenta Aly, M.D.  General Surgery, PGY2    History of Present Illness:   Julie [name] is seen in consultation for small bowel obstruction management at the request of [name], MD on the [facility] (MD[facility]) service.     Patient is a 72 y.o. female with complicated previous medical history status post DCD DDKT in 07/2022, pretension, type 2 diabetes, hypothyroidism, who was admitted for GNR bacteremia.  Patient developed small bowel obstruction, NG tube placed.  General surgery recommended for recommendations.    Denies nausea and vomiting.  She states last bowel movement was small and liquid yesterday.  Denies passing gas.  Endorses mild abdominal pain.    Past Medical History:   Diagnosis Date    Anemia     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)     since July 2017    Hypertension Hypothyroidism (acquired)     Kidney transplant status, cadaveric 08/08/2022    Type 2 diabetes mellitus (CMS-HCC)        Past Surgical History:   Procedure Laterality Date    CATARACT EXTRACTION      HYSTERECTOMY      OOPHORECTOMY      PERITONEAL CATHETER INSERTION      PR LAP INSERTION TUNNELED INTRAPERITONEAL CATHETER N/A 09/27/2018    Procedure: LAPAROSCOPY, SURGICAL; WITH INSERTION OF INTRAPERITONEAL CANNULA OR CATHETER, PERMANENT;  Surgeon: Leona Carry, MD;  Location: MAIN OR [place];  Service: Transplant    PR LAP INSERTION TUNNELED INTRAPERITONEAL CATHETER N/A 04/08/2019    Procedure: LAPAROSCOPY, SURGICAL; WITH INSERTION OF INTRAPERITONEAL CANNULA OR CATHETER, PERMANENT;  Surgeon: Leona Carry, MD;  Location: MAIN OR [facility];  Service: Transplant    PR LAP REVISE INTRAPERITONEAL CATHETER N/A 08/26/2019    Procedure: LAPAROSCOPY, SURGICAL; W/REVIS PREV PLACED INTRAPERITONEAL CANNULA/CATH, REMOV INTRALUMIN OBSTRUCT MATERIAL;  Surgeon: Leona Carry, MD;  Location: MAIN OR [facility];  Service: Transplant    PR REMOVAL TUNNELED INTRAPERITONEAL CATHETER N/A 05/06/2018    Procedure: REMOVAL OF PERMANENT INTRAPERITONEAL CANNULA OR CATHETER;  Surgeon: Leona Carry, MD;  Location: MAIN OR [facility];  Service: Transplant    PR REMOVE PERITONEAL FOREIGN BODY N/A 04/08/2019    Procedure: Removal Of Peritoneal Of Foreign Body From Peritoneal Cavity;  Surgeon: Leona Carry, MD;  Location: MAIN OR [facility];  Service: Transplant    PR TRANSPLANT,PREP RENAL  GRAFT/ARTERIAL Right 08/06/2022    Procedure: [facility] RECONSTRUCTION CADAVER/LIVING DONOR RENAL ALLOGRAFT PRIOR TO TRANSPLANT; ARTERIAL ANASTOMOSIS EAC;  Surgeon: Toledo, Lilyan Punt, MD;  Location: MAIN OR [facility];  Service: Transplant    PR TRANSPLANTATION OF KIDNEY Right 08/06/2022    Procedure: RENAL ALLOTRANSPLANTATION, IMPLANTATION OF GRAFT; WITHOUT RECIPIENT NEPHRECTOMY;  Surgeon: Toledo, Lilyan Punt, MD;  Location: MAIN OR [facility];  Service: Transplant    TUBAL LIGATION         Medication:  Current Facility-Administered Medications   Medication Dose Route Frequency Provider Last Rate Last Admin    acetaminophen (OFIRMEV) 10 mg/mL injection 650 mg 65 mL  650 mg Intravenous Q6H Gracelyn Nurse, MD   Stopped at 10/08/22 0059    amlodipine (NORVASC) tablet 5 mg  5 mg Oral BID Gracelyn Nurse, MD        atorvastatin (LIPITOR) tablet 10 mg  10 mg Oral Daily Gracelyn Nurse, MD   10 mg at 10/07/22 0855    carvedilol (COREG) tablet 12.5 mg  12.5 mg Oral BID Valeta Harms, MD        cloNIDine HCL (CATAPRES) tablet 0.1 mg  0.1 mg Oral BID Valeta Harms, MD        dextrose (D10W) 10% bolus 125 mL  12.5 g Intravenous Q10 Min PRN Gracelyn Nurse, MD        glucagon injection 1 mg  1 mg Intramuscular Once PRN Gracelyn Nurse, MD        glucose chewable tablet 16 g  16 g Oral Q10 Min PRN Gracelyn Nurse, MD        guaiFENesin (ROBITUSSIN) oral syrup  200 mg Oral Q4H PRN Gracelyn Nurse, MD        heparin (porcine) 5,000 unit/mL injection 5,000 Units  5,000 Units Subcutaneous [facility] Gracelyn Nurse, MD   5,000 Units at 10/07/22 2100    HYDROmorphone (PF) injection Syrg 0.25 mg  0.25 mg Intravenous Q4H PRN Valeta Harms, MD   0.25 mg at 10/07/22 1959    insulin glargine (LANTUS) injection 6 Units  6 Units Subcutaneous Nightly Gracelyn Nurse, MD   6 Units at 10/07/22 2040    insulin lispro (HumaLOG) injection 0-20 Units  0-20 Units Subcutaneous ACHS Gracelyn Nurse, MD   1 Units at 10/07/22 2057    labetalol (NORMODYNE) injection  10 mg Intravenous Q6H PRN Valeta Harms, MD   10 mg at 10/08/22 0044    levothyroxine (SYNTHROID) tablet 112 mcg  112 mcg Oral daily Gracelyn Nurse, MD   112 mcg at 10/07/22 1610    magnesium oxide-Mg AA chelate (Magnesium Plus Protein) 2 tablet  2 tablet Oral BID Daine Floras, MD   2 tablet at 10/07/22 2039    melatonin tablet 3 mg  3 mg Oral Nightly PRN Gracelyn Nurse, MD        meropenem (MERREM) 1 g in sodium chloride 0.9 % (NS) 100 mL IVPB-MBP  1 g Intravenous Q12H Valeta Harms, MD   Stopped at 10/07/22 2109    mycophenolate (CELLCEPT) 250 mg in dextrose 5 % 100 mL IVPB  250 mg Intravenous Q12H Gracelyn Nurse, MD        ondansetron (ZOFRAN-ODT) disintegrating tablet 4 mg  4 mg Oral Q8H PRN Gracelyn Nurse, MD        Or    ondansetron [facility]) injection 4 mg  4 mg Intravenous Q8H PRN Gae Dry,  Rolley Sims, MD   4 mg at 10/07/22 1514    pantoprazole (Protonix) EC tablet 40 mg  40 mg Oral Daily Gracelyn Nurse, MD   40 mg at 10/07/22 0856    predniSONE (DELTASONE) tablet 5 mg  5 mg Oral Daily Gracelyn Nurse, MD   5 mg at 10/07/22 0855    simethicone (MYLICON) chewable tablet 80 mg  80 mg Oral Q6H PRN Valeta Harms, MD   80 mg at 10/07/22 1200    sodium bicarbonate tablet 650 mg  650 mg Oral BID Daine Floras, MD   650 mg at 10/07/22 2039    sulfamethoxazole-trimethoprim (BACTRIM) 400-80 mg tablet 80 mg of trimethoprim  1 tablet Oral Once per day on Monday Wednesday Friday Gracelyn Nurse, MD   80 mg of trimethoprim at 10/06/22 0845    tacrolimus (PROGRAF) capsule 2 mg  2 mg Oral Nightly Valeta Harms, MD   2 mg at 10/07/22 1959    tacrolimus (PROGRAF) capsule 3 mg  3 mg Oral Daily Valeta Harms, MD   3 mg at 10/07/22 1610    valGANciclovir (VALCYTE) tablet 450 mg  450 mg Oral Every other day Gracelyn Nurse, MD   450 mg at 10/07/22 9604       Allergies   Allergen Reactions    Oxycodone Nausea And Vomiting     Per Pt cannot tolerate oxycodone    Penicillins Other (See Comments)     patient denies PCN allergy and states she had formal allergy testing that ruled out PCN allergy. 09/27/18    Prior allergy comment states hypotension and rash with PCN, patient does not recall this    Valacyclovir Nausea Only and Other (See Comments)       Social History:  Social History     Tobacco Use    Smoking status: Former Current packs/day: 0.00     Average packs/day: 0.8 packs/day for 30.0 years (22.5 ttl pk-yrs)     Types: Cigarettes     Start date: 06/06/1976     Quit date: 06/06/2006     Years since quitting: 16.3    Smokeless tobacco: Former   Substance Use Topics    Alcohol use: Yes     Comment: only special occasions    Drug use: No       Family History   Problem Relation Age of Onset    Diabetes Mother     Alzheimer's disease Father     Diabetes Sister     Diabetes Sister     Diabetes Sister     Anesthesia problems Neg Hx     Bleeding Disorder Neg Hx        Review of Systems  10 systems were reviewed and are negative except as noted specifically in the HPI.    Objective:   BP 193/32  - Pulse 73  - Temp 37.4 ??C (99.4 ??F) (Oral)  - Resp 20  - Ht 147.3 cm (4' 10)  - Wt 58.9 kg (129 lb 13.6 oz)  - SpO2 95%  - BMI 27.14 kg/m??     Intake/Output last 3 shifts:  I/O last 3 completed shifts:  In: -   Out: 2427 [Urine:1627; Emesis/NG output:800]    Physical Exam:  Vitals:    10/08/22 0000   BP: 193/32   Pulse: 73   Resp: 20   Temp:    SpO2: 95%      General: Chronically ill-appearing, no  acute distress  Neuro: answers questions appropriately  Head: normocephalic, atraumatic, tongue midline  Neck: Supple without masses  Cardiac: regular rate and rhythm  Lungs: Normal work of breathing  Abdomen: Soft, mildly distended, mild discomfort with palpation.  Left lower quadrant incision well-healed.      Most Recent Labs:  Lab Results   Component Value Date    WBC 6.7 10/07/2022    HGB 10.0 (L) 10/07/2022    HCT 29.4 (L) 10/07/2022    PLT 65 (L) 10/07/2022       Lab Results   Component Value Date    NA 131 (L) 10/07/2022    K 4.6 10/07/2022    CL 108 (H) 10/07/2022    CO2 17.0 (L) 10/07/2022    BUN 53 (H) 10/07/2022    CREATININE 1.93 (H) 10/07/2022    CALCIUM 9.1 10/07/2022    MG 1.7 10/07/2022    PHOS 2.2 (L) 10/05/2022       IMAGING:  Relevant imaging reviewed

## 2022-10-08 NOTE — Unmapped (Signed)
Patient is A&Ox4, has been on room air throughout the day but desats to 87% when sleeping - 1L of O2 applied. NG tube placed and confirmed placement with KUB. NG tube to low intermittent suction with output of . NSR on tele. No bowel movement this shift, last BM 10/06/22. VBG, lactic and iron panel were drawn today. Patient has dentures and hearing aides at bedside. Zofran x1 given Dilaudid x1 given this shift. Clear liquid diet started today. Poor PO intake. Patient speaks fluent english no interpreter was needed. No complaints of pain or discomfort at this time.     Problem: Adult Inpatient Plan of Care  Goal: Plan of Care Review  Outcome: Progressing  Goal: Patient-Specific Goal (Individualized)  Outcome: Progressing  Goal: Absence of Hospital-Acquired Illness or Injury  Outcome: Progressing  Intervention: Identify and Manage Fall Risk  Recent Flowsheet Documentation  Taken 10/07/2022 1800 by Karlene Lineman, RN  Safety Interventions:   fall reduction program maintained   lighting adjusted for tasks/safety   low bed  Taken 10/07/2022 1600 by Karlene Lineman, RN  Safety Interventions:   environmental modification   family at bedside   infection management   isolation precautions   low bed   mattress on floor   lighting adjusted for tasks/safety   nonskid shoes/slippers when out of bed  Taken 10/07/2022 1200 by Karlene Lineman, RN  Safety Interventions:   fall reduction program maintained   family at bedside   isolation precautions   lighting adjusted for tasks/safety   low bed   nonskid shoes/slippers when out of bed  Taken 10/07/2022 1000 by Karlene Lineman, RN  Safety Interventions:   bed alarm   fall reduction program maintained   lighting adjusted for tasks/safety   low bed  Taken 10/07/2022 0800 by Karlene Lineman, RN  Safety Interventions:   bed alarm   fall reduction program maintained   lighting adjusted for tasks/safety   low bed  Intervention: Prevent Skin Injury  Recent Flowsheet Documentation  Taken 10/07/2022 1800 by Karlene Lineman, RN  Positioning for Skin: Supine/Back  Device Skin Pressure Protection:   absorbent pad utilized/changed   adhesive use limited   tubing/devices free from skin contact  Skin Protection:   adhesive use limited   incontinence pads utilized   tubing/devices free from skin contact  Taken 10/07/2022 1600 by Karlene Lineman, RN  Positioning for Skin: Left  Taken 10/07/2022 1400 by Karlene Lineman, RN  Positioning for Skin: Right  Device Skin Pressure Protection: absorbent pad utilized/changed  Taken 10/07/2022 1200 by Karlene Lineman, RN  Device Skin Pressure Protection: absorbent pad utilized/changed  Taken 10/07/2022 1000 by Karlene Lineman, RN  Device Skin Pressure Protection:   absorbent pad utilized/changed   adhesive use limited   tubing/devices free from skin contact  Skin Protection:   adhesive use limited   incontinence pads utilized   tubing/devices free from skin contact  Taken 10/07/2022 0800 by Karlene Lineman, RN  Device Skin Pressure Protection:   absorbent pad utilized/changed   adhesive use limited   tubing/devices free from skin contact  Skin Protection:   adhesive use limited   incontinence pads utilized   tubing/devices free from skin contact  Intervention: Prevent and Manage VTE (Venous Thromboembolism) Risk  Recent Flowsheet Documentation  Taken 10/07/2022 1800 by Karlene Lineman, RN  Anti-Embolism Device Type: SCD, Knee  Anti-Embolism Intervention: Refused  Anti-Embolism Device Location: BLE  Intervention: Prevent Infection  Recent Flowsheet Documentation  Taken 10/07/2022 1800 by Karlene Lineman, RN  Infection Prevention:   hand hygiene promoted   personal protective equipment utilized  Taken 10/07/2022 1600 by Karlene Lineman, RN  Infection Prevention:   equipment surfaces disinfected   hand hygiene promoted   personal protective equipment utilized   rest/sleep promoted  Taken 10/07/2022 1400 by Karlene Lineman, RN  Infection Prevention:   equipment surfaces disinfected   hand hygiene promoted   personal protective equipment utilized   rest/sleep promoted  Taken 10/07/2022 1200 by Karlene Lineman, RN  Infection Prevention:   equipment surfaces disinfected   hand hygiene promoted   personal protective equipment utilized  Taken 10/07/2022 1000 by Karlene Lineman, RN  Infection Prevention:   hand hygiene promoted   equipment surfaces disinfected  Taken 10/07/2022 0800 by Karlene Lineman, RN  Infection Prevention: hand hygiene promoted  Goal: Optimal Comfort and Wellbeing  Outcome: Progressing  Goal: Readiness for Transition of Care  Outcome: Progressing  Goal: Rounds/Family Conference  Outcome: Progressing     Problem: Infection  Goal: Absence of Infection Signs and Symptoms  Outcome: Progressing  Intervention: Prevent or Manage Infection  Recent Flowsheet Documentation  Taken 10/07/2022 1800 by Karlene Lineman, RN  Isolation Precautions: enteric precautions maintained  Taken 10/07/2022 1600 by Karlene Lineman, RN  Isolation Precautions: enteric precautions maintained     Problem: Fall Injury Risk  Goal: Absence of Fall and Fall-Related Injury  Outcome: Progressing  Intervention: Promote Scientist, clinical (histocompatibility and immunogenetics) Documentation  Taken 10/07/2022 1800 by Karlene Lineman, RN  Safety Interventions:   fall reduction program maintained   lighting adjusted for tasks/safety   low bed  Taken 10/07/2022 1600 by Karlene Lineman, RN  Safety Interventions:   environmental modification   family at bedside   infection management   isolation precautions   low bed   mattress on floor   lighting adjusted for tasks/safety   nonskid shoes/slippers when out of bed  Taken 10/07/2022 1200 by Karlene Lineman, RN  Safety Interventions:   fall reduction program maintained   family at bedside   isolation precautions   lighting adjusted for tasks/safety   low bed   nonskid shoes/slippers when out of bed  Taken 10/07/2022 1000 by Karlene Lineman, RN  Safety Interventions:   bed alarm   fall reduction program maintained lighting adjusted for tasks/safety   low bed  Taken 10/07/2022 0800 by Karlene Lineman, RN  Safety Interventions:   bed alarm   fall reduction program maintained   lighting adjusted for tasks/safety   low bed     Problem: Self-Care Deficit  Goal: Improved Ability to Complete Activities of Daily Living  Outcome: Progressing     Problem: Comorbidity Management  Goal: Maintenance of Asthma Control  Outcome: Progressing  Goal: Maintenance of Behavioral Health Symptom Control  Outcome: Progressing  Goal: Maintenance of COPD Symptom Control  Outcome: Progressing  Goal: Blood Glucose Levels Within Targeted Range  Outcome: Progressing  Goal: Maintenance of Heart Failure Symptom Control  Outcome: Progressing  Goal: Blood Pressure in Desired Range  Outcome: Progressing  Goal: Maintenance of Osteoarthritis Symptom Control  Outcome: Progressing  Intervention: Maintain Osteoarthritis Symptom Control  Recent Flowsheet Documentation  Taken 10/07/2022 1800 by Karlene Lineman, RN  Activity Management: bedrest  Taken 10/07/2022 0800 by Karlene Lineman, RN  Activity Management: bedrest  Goal: Bariatric Home Regimen Maintained  Outcome: Progressing  Goal: Maintenance of Seizure Control  Outcome: Progressing     Problem: Skin Injury Risk Increased  Goal: Skin Health and Integrity  Outcome: Progressing  Intervention: Optimize  Skin Protection  Recent Flowsheet Documentation  Taken 10/07/2022 1800 by Karlene Lineman, RN  Activity Management: bedrest  Pressure Reduction Techniques:   frequent weight shift encouraged   heels elevated off bed  Pressure Reduction Devices: pressure-redistributing mattress utilized  Skin Protection:   adhesive use limited   incontinence pads utilized   tubing/devices free from skin contact  Taken 10/07/2022 1600 by Karlene Lineman, RN  Pressure Reduction Techniques: frequent weight shift encouraged  Pressure Reduction Devices: pressure-redistributing mattress utilized  Taken 10/07/2022 1400 by Karlene Lineman, RN  Pressure Reduction Techniques: frequent weight shift encouraged  Pressure Reduction Devices: pressure-redistributing mattress utilized  Taken 10/07/2022 1200 by Karlene Lineman, RN  Pressure Reduction Techniques: frequent weight shift encouraged  Pressure Reduction Devices: pressure-redistributing mattress utilized  Taken 10/07/2022 1000 by Karlene Lineman, RN  Pressure Reduction Techniques:   frequent weight shift encouraged   heels elevated off bed  Pressure Reduction Devices: pressure-redistributing mattress utilized  Skin Protection:   adhesive use limited   incontinence pads utilized   tubing/devices free from skin contact  Taken 10/07/2022 0800 by Karlene Lineman, RN  Activity Management: bedrest  Pressure Reduction Techniques:   frequent weight shift encouraged   heels elevated off bed  Pressure Reduction Devices: pressure-redistributing mattress utilized  Skin Protection:   adhesive use limited   incontinence pads utilized   tubing/devices free from skin contact

## 2022-10-08 NOTE — Unmapped (Addendum)
Tacrolimus Therapeutic Monitoring Pharmacy Note    Julie [name] is a 72 y.o. female continuing tacrolimus.     Indication: Kidney transplant     Date of Transplant:  08/06/22       Prior Dosing Information: Current regimen 3 mg in AM and 2 mg in PM. Prior outpatient regimen was 2 mg BID.       Source(s) of information used to determine prior to admission dosing: Home Medication List or Clinic Note    Goals:  Therapeutic Drug Levels  Tacrolimus trough goal:  7-9 ng/mL    Additional Clinical Monitoring/Outcomes  Monitor renal function (SCr and urine output) and liver function (LFTs)  Monitor for signs/symptoms of adverse events (e.g., hyperglycemia, hyperkalemia, hypomagnesemia, hypertension, headache, tremor)    Results:   Tacrolimus level:  9.5 ng/mL, drawn appropriately    Pharmacokinetic Considerations and Significant Drug Interactions:  Concurrent hepatotoxic medications: None identified  Concurrent CYP3A4 substrates/inhibitors: None identified  Concurrent nephrotoxic medications: None identified    Assessment/Plan:  Recommendedation(s)  Originally was planning to continue 3 mg in the morning and 2 mg in the evening, however, with the current SBO and NGT to suction, recommend changing to sublingual dosing.   Change to equivalent SL dosing: 1.5 mg SL in AM and 1 mg SL in PM.      Follow-up  Daily levels ordered x 5 days   A pharmacist will continue to monitor and recommend levels as appropriate    Please page service pharmacist with questions/clarifications.    Phoebe Perch, PharmD

## 2022-10-08 NOTE — Unmapped (Addendum)
Transplant Nephrology Consult Followup      Assessment/Recommendations: Julie [name] is a/an 72 y.o. 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 Developed ileus on hospital day 2 (3/16).     #Status post kidney transplant, Allograft Function (unstable), AKI:   She has mild AKI not far above baseline. Outpatient baseline creatinine 1.5-1.8, peak 2.1 on admission now stable at 1.9. She has difficulty maintaining adequate PO hydration chronically since transplant.  - maintenance IVF until clearly tolerating PO hydration of 2L water daily  - stop PO sodium bicarb due to pill burden    #Immunosuppression [High risk medical decision making for drug therapy requiring intensive monitoring for toxicity]  Adjusting IS plan today based on ileus with NGT suctioning.   - plan is to clamp the NGT for an hour twice daily to give PO meds BID  - tacrolimus: 3 mg AM, 2 mg PM, keep it PO and give during NGT clamp.  - last dose change 3/15 PM  - goal level 6-8    - level from 3/17 is elevated but in setting of recent diarrhea; no dose change  - mycophenolate 250 mg BID via IV (home dose is 500 mg BID PO)  - prednisone - convert to methylprednisolone 5 mg daily IV    # Perinephric fluid collection  Potential for VIR to place drain for fluid sampling, pending further discussion with SRF. At this time she is clearly defervesced and the fluid collection is less prominent on the CT 3/17. Will discuss with ICID. Not favoring invasive procedure unless concern for an area not penetrated by antibiotics, given she is clinically improving.     #  E.coli bacteremia of urinary source  Currently management is meropenem 3/14-present.  Hemodynamically improved and repeat BCx from 3/16 to follow up.    - outpatient referral to urogynecology    # Ileus vs SBO  Diarrhea on presentation, no anti-motility agents were given, rapidly transitioned to ileus. Overnight 3/16-3/17CT due to c/f SBO. No hypokalemia to explain intestinal dysmotility. Appreciate primary team and surgery's management. Currently starting NGT to low intermittent suction for decompression. Lactate reassuring. As of 3/17 afternoon had large bowel movement, hopefully ileus will resolve; diet advancement as per surgery/primary team.     # BP Management:  - Home regimen: amlodipine 10 mg daily, carvedilol 25 mg BID, clonidine 0.1 mg TID, hydralazine 100 mg TID, chorthalidone 25 mg daily, spironolactone 25 mg daily.  She has always had a very wide pulse pressure/low diastolics. Initially her home anti-HTN meds were held due to hypotension in setting of sepsis; now time to add some back.   - carvedilol 25 mg BID, PO during the BID clamping  - clonidine: I favor using clonidine patch 0.3 mg/24hr to ensure absorption (as opposed to PO)   - amlodipine PO (5 mg BID to help promote absorption - home dose is 10 mg daily)  - for a PRN (SBP persistently >170) would give labetalol 20 mg IV; ok to repeat if sustained HTN 30 min later  - we continue to hold the chlorthalidone, spironolactone and hydralazine (quick to add back PO hydralazine but please don't use frequent IV hydralazine due to AKI risk)    # Infectious Prophylaxis and Monitoring:   CMV D+/R+, EBV D+/R+  CMV ppx:  Valcyte x3 mo  PJP ppx: Bactrim x6 mo    Discussed recommendations with primary team.     Leafy Half, MD  Division of Nephrology and Hypertension  [facility]  10/08/2022      _____________________________________________________________________________________    Kidney Transplant History:   Date of Transplant: 08/06/2022 (Kidney)  Type of Transplant: DCD  KDPI: 56%  Cold ischemic time: 877 minutes (14 hr 37 min)  Warm ischemic time: 32 minutes  cPRA: 0%  HLA match:   Blood type: Donor A1, Recipient A POS  ID: CMV D+/R+, EBV D+/R+  Native Kidney Disease: Diabetes              Native kidney biopsy: Diabetic nephropathy (done elsewhere)              Pre-transplant dialysis course: PD started July 2017, transitioned to HD October 2019 due to catheter dysfunction/inadequate dialysis  Prior Transplants: None  Induction: Basiliximab  Early steroid withdrawal: No    Subjective/Overnight events:  Abdominal distension with NGT placed due to ileus (KUBs and lactate reassuring against SBO this morning)      Medications:   Current Facility-Administered Medications   Medication Dose Route Frequency Provider Last Rate Last Admin    [Provider Hold] acetaminophen (OFIRMEV) 10 mg/mL injection 650 mg 65 mL  650 mg Intravenous Q6H Gracelyn Nurse, MD   Stopped at 10/08/22 0443    amlodipine (NORVASC) tablet 5 mg  5 mg Oral BID Gracelyn Nurse, MD   5 mg at 10/08/22 0526    atorvastatin (LIPITOR) tablet 10 mg  10 mg Oral Daily Gracelyn Nurse, MD   10 mg at 10/08/22 1610    carvedilol (COREG) tablet 25 mg  25 mg Oral BID Quentin Cornwall, MD        cloNIDine HCL (CATAPRES) tablet 0.1 mg  0.1 mg Oral BID Valeta Harms, MD   0.1 mg at 10/08/22 0834    dextrose (D10W) 10% bolus 125 mL  12.5 g Intravenous Q10 Min PRN Gracelyn Nurse, MD        glucagon injection 1 mg  1 mg Intramuscular Once PRN Gracelyn Nurse, MD        glucose chewable tablet 16 g  16 g Oral Q10 Min PRN Gracelyn Nurse, MD        guaiFENesin (ROBITUSSIN) oral syrup  200 mg Oral Q4H PRN Gracelyn Nurse, MD        heparin (porcine) 5,000 unit/mL injection 5,000 Units  5,000 Units Subcutaneous Q8H [facility] Gracelyn Nurse, MD   5,000 Units at 10/08/22 1357    HYDROmorphone (PF) injection Syrg 0.25 mg  0.25 mg Intravenous Q4H PRN Valeta Harms, MD   0.25 mg at 10/08/22 1420    insulin glargine (LANTUS) injection 6 Units  6 Units Subcutaneous Nightly Gracelyn Nurse, MD   6 Units at 10/07/22 2040    insulin lispro (HumaLOG) injection 0-20 Units  0-20 Units Subcutaneous ACHS Gracelyn Nurse, MD   1 Units at 10/07/22 2057    labetalol (NORMODYNE) injection  10 mg Intravenous Q6H PRN Valeta Harms, MD   10 mg at 10/08/22 0044    levothyroxine (SYNTHROID) tablet 112 mcg  112 mcg Oral daily Gracelyn Nurse, MD   112 mcg at 10/08/22 9604    magnesium oxide-Mg AA chelate (Magnesium Plus Protein) 2 tablet  2 tablet Oral BID Daine Floras, MD   2 tablet at 10/08/22 [phone]  melatonin tablet 3 mg  3 mg Oral Nightly PRN Gracelyn Nurse, MD        meropenem (MERREM) 1 g in sodium chloride 0.9 % (NS) 100 mL IVPB-MBP  1 g Intravenous Q12H Valeta Harms, MD   Stopped at 10/08/22 0930    mycophenolate (CELLCEPT) 250 mg in dextrose 5 % 100 mL IVPB  250 mg Intravenous Q12H Quentin Cornwall, MD   Stopped at 10/08/22 1349    ondansetron (ZOFRAN-ODT) disintegrating tablet 4 mg  4 mg Oral Q8H PRN Gracelyn Nurse, MD        Or    ondansetron [facility]) injection 4 mg  4 mg Intravenous Q8H PRN Gracelyn Nurse, MD   4 mg at 10/08/22 1423    pantoprazole (Protonix) EC tablet 40 mg  40 mg Oral Daily Gracelyn Nurse, MD   40 mg at 10/08/22 0834    predniSONE (DELTASONE) tablet 5 mg  5 mg Oral Daily Gracelyn Nurse, MD   5 mg at 10/08/22 0834    simethicone (MYLICON) chewable tablet 80 mg  80 mg Oral Q6H PRN Valeta Harms, MD   80 mg at 10/07/22 1200    sodium bicarbonate tablet 650 mg  650 mg Oral BID Daine Floras, MD   650 mg at 10/08/22 1610    sodium chloride 0.9 % with KCl 20 mEq/L infusion  100 mL/hr Intravenous Continuous Quentin Cornwall, MD 100 mL/hr at 10/08/22 1349 100 mL/hr at 10/08/22 1349    sulfamethoxazole-trimethoprim (BACTRIM) 400-80 mg tablet 80 mg of trimethoprim  1 tablet Oral Once per day on Monday Wednesday Friday Gracelyn Nurse, MD   80 mg of trimethoprim at 10/06/22 0845    tacrolimus (PROGRAF) capsule 2 mg  2 mg Oral Nightly Valeta Harms, MD   2 mg at 10/07/22 1959    tacrolimus (PROGRAF) capsule 3 mg  3 mg Oral Daily Valeta Harms, MD   3 mg at 10/08/22 9604    valGANciclovir (VALCYTE) tablet 450 mg  450 mg Oral Every other day Gracelyn Nurse, MD   450 mg at 10/07/22 5409        ALLERGIES  Oxycodone, Penicillins, and Valacyclovir    Review of Systems:  As per HPI, all other systems reviewed and negative    Physical Exam:  Vitals:    10/08/22 1407   BP: 151/27   Pulse: 76   Resp: 17   Temp:    SpO2: 95%     I/O this shift:  In: 200 [P.O.:200]  Out: 1300 [Urine:200; Emesis/NG output:1100]    Intake/Output Summary (Last 24 hours) at 10/08/2022 1433  Last data filed at 10/08/2022 1200  Gross per 24 hour   Intake 870 ml   Output 3000 ml   Net -2130 ml       General: no acute distress. NGT in place.  HEENT: anicteric sclera  CV: no peripheral edema  Lungs: minimally increased work of breathing, [place] oxygen in place  Abdomen: distended, uncomfortable but not tender; bowel sounds absent.  Skin: no visible lesions or rashes      Test Results  Reviewed  Lab Results   Component Value Date    NA 137 10/08/2022    K 4.4 10/08/2022    CL 108 (H) 10/08/2022    CO2 19.0 (L) 10/08/2022    BUN 56 (H) 10/08/2022    CREATININE 1.92 (H) 10/08/2022    GLU 116 10/08/2022  CALCIUM 8.8 10/08/2022    ALBUMIN 3.3 (L) 10/06/2022    PHOS 2.2 (L) 10/05/2022

## 2022-10-08 NOTE — Unmapped (Signed)
ICU TRANSPORT NOTE    Destination: CT    Departing Unit: CTSU  Pickup Time: 2005    Return Unit: CTSU  Return Time: 2030    [place] patient ID band verified  Allergies Reviewed  Code Status at time of transport: Full    Report received from primary nurse via [employer]. Handoff performed . Patient transported via stretcher with backboard  under stepdown level of care. See vital signs during transport via [employer]. O2 via [place] @1  liter. Patient is following commands, drowsy, and oriented. Patient tolerated procedure/scan well. Enteric, Aspiration, and Fall  precautions maintained throughout transport.    Update and care given to primary nurse. See Doc Flowsheets/MAR for additional transportation documentation. Proper body mechanics and safe patient handling equipment were utilized throughout transport.

## 2022-10-08 NOTE — Unmapped (Signed)
CT completed. General surgery consult placed. Variable BPs; difficult to control with current parameters and frequency of PRNs.     Problem: Adult Inpatient Plan of Care  Goal: Plan of Care Review  Outcome: Progressing  Goal: Patient-Specific Goal (Individualized)  Outcome: Progressing  Goal: Absence of Hospital-Acquired Illness or Injury  Outcome: Progressing  Intervention: Identify and Manage Fall Risk  Recent Flowsheet Documentation  Taken 10/07/2022 1930 by Jacky Kindle, RN  Safety Interventions:   bleeding precautions   fall reduction program maintained   isolation precautions   lighting adjusted for tasks/safety   no IV/BP/blood draw left arm   nonskid shoes/slippers when out of bed  Intervention: Prevent Skin Injury  Recent Flowsheet Documentation  Taken 10/08/2022 0000 by Jacky Kindle, RN  Positioning for Skin: Supine/Back  Device Skin Pressure Protection:   absorbent pad utilized/changed   adhesive use limited  Skin Protection:   adhesive use limited   incontinence pads utilized  Taken 10/07/2022 1930 by Jacky Kindle, RN  Positioning for Skin: Supine/Back  Device Skin Pressure Protection:   absorbent pad utilized/changed   adhesive use limited  Skin Protection:   adhesive use limited   incontinence pads utilized  Intervention: Prevent and Manage VTE (Venous Thromboembolism) Risk  Recent Flowsheet Documentation  Taken 10/08/2022 0000 by Jacky Kindle, RN  Anti-Embolism Intervention: Refused  Taken 10/07/2022 1930 by Jacky Kindle, RN  VTE Prevention/Management:   ambulation promoted   anticoagulant therapy   bleeding precautions maintained   bleeding risk factors identified   dorsiflexion/plantar flexion performed   fluids promoted  Anti-Embolism Intervention: Refused  Intervention: Prevent Infection  Recent Flowsheet Documentation  Taken 10/07/2022 1930 by Jacky Kindle, RN  Infection Prevention:   cohorting utilized   environmental surveillance performed   equipment surfaces disinfected   hand hygiene promoted   personal protective equipment utilized   rest/sleep promoted   single patient room provided   visitors restricted/screened  Goal: Optimal Comfort and Wellbeing  Outcome: Progressing  Goal: Readiness for Transition of Care  Outcome: Progressing  Goal: Rounds/Family Conference  Outcome: Progressing     Problem: Infection  Goal: Absence of Infection Signs and Symptoms  Outcome: Progressing  Intervention: Prevent or Manage Infection  Recent Flowsheet Documentation  Taken 10/07/2022 1930 by Jacky Kindle, RN  Infection Management: aseptic technique maintained  Isolation Precautions:   enteric precautions maintained   protective precautions maintained     Problem: Fall Injury Risk  Goal: Absence of Fall and Fall-Related Injury  Outcome: Progressing  Intervention: Promote Injury-Free Environment  Recent Flowsheet Documentation  Taken 10/07/2022 1930 by Jacky Kindle, RN  Safety Interventions:   bleeding precautions   fall reduction program maintained   isolation precautions   lighting adjusted for tasks/safety   no IV/BP/blood draw left arm   nonskid shoes/slippers when out of bed     Problem: Self-Care Deficit  Goal: Improved Ability to Complete Activities of Daily Living  Outcome: Progressing     Problem: Comorbidity Management  Goal: Maintenance of Asthma Control  Outcome: Progressing  Goal: Maintenance of Behavioral Health Symptom Control  Outcome: Progressing  Goal: Maintenance of COPD Symptom Control  Outcome: Progressing  Goal: Blood Glucose Levels Within Targeted Range  Outcome: Progressing  Goal: Maintenance of Heart Failure Symptom Control  Outcome: Progressing  Goal: Blood Pressure in Desired Range  Outcome: Progressing  Goal: Maintenance of Osteoarthritis Symptom Control  Outcome: Progressing  Intervention: Maintain Osteoarthritis Symptom Control  Recent Flowsheet  Documentation  Taken 10/07/2022 1930 by Jacky Kindle, RN  Activity Management: bedrest  Goal: Bariatric Home Regimen Maintained  Outcome: Progressing  Goal: Maintenance of Seizure Control  Outcome: Progressing     Problem: Skin Injury Risk Increased  Goal: Skin Health and Integrity  Outcome: Progressing  Intervention: Optimize Skin Protection  Recent Flowsheet Documentation  Taken 10/08/2022 0000 by Jacky Kindle, RN  Pressure Reduction Techniques:   frequent weight shift encouraged   heels elevated off bed   positioned off wounds  Head of Bed (HOB) Positioning: HOB at 30 degrees  Pressure Reduction Devices: pressure-redistributing mattress utilized  Skin Protection:   adhesive use limited   incontinence pads utilized  Taken 10/07/2022 1930 by Jacky Kindle, RN  Activity Management: bedrest  Pressure Reduction Techniques:   frequent weight shift encouraged   heels elevated off bed   positioned off wounds  Pressure Reduction Devices: pressure-redistributing mattress utilized  Skin Protection:   adhesive use limited   incontinence pads utilized

## 2022-10-08 NOTE — Unmapped (Signed)
Internal Medicine (MEDW) Progress Note    Assessment & Plan:   Julie [name] is a 72 y.o. female whose presentation is complicated by DDKT, HTN, hypothyroidism, T2DM that presented to [facility] with fevers, nausea and vomiting, diarrhea found to have Gram Negative Bacteremia.     Active Problems  GNR bacteremia iso immunocompromised status - Perinephric fluid collection - Fever, N/V/D  Patient presenting with 48-hour history of fevers, febrile in ED 100.4, 24-hour history of nausea/vomiting and diarrhea.  No leukocytosis on labs, though WBC is elevated from baseline (10.6 3/14 usually ~5-7) patient does have left shift with neutrophils of 9.7.  Unclear etiology most likely gastroenteritis given acute nausea vomiting diarrhea, could also be secondary to gram-negative bacteremia.  Lower suspicion of respiratory, urinary source given patient is asymptomatic. Bcx (+) GNR. UA 3/14 positive for leukocyte esterase and WBC. CTAP 3/14 showed perinephric collection consistent with previous renal US and c/f renal infection, possibly pyelonephritis was not definitive given lack of contrast.  VIR consulted for drainage of perinephric fluid collection for culture and possible source control, likely 3/18.   -ID consulted, appreciate assistance   -ICID following; appreciate recs  -F/U UCx, CMV, EBV pending  -C diff, GIPP need to be collected  -Continue bactrim for PJP prophylaxis  -Continue valganciclovir for CMV prophylaxis  -Continue tacrolimus, PTD  -Continue prednisone 5mg  daily  -Continue meropenem  -Repeat 3/16 Bcx sent  -s/p vancomycin (1250mg  once 3/14)  -d/c cefepime  -d/c CellCept 250mg  BID (500mg  BID home dose)     Abdominal pain, distention  Evening 3/15 new onset abd pain, distention. Exam (-) peritonitis, (+) BS. KUB shows increasing small bowel dilatation up to 4.2 cm with relative decompression of the colon. CTAP 3/14 showed no evidence of SBO or inflammation of GI tract. Suspect ileus after having days of diarrhea. Unlikely for her to have developed an SBO in such a short timeframe. Lactate wnl. Low threshold however to obtain repeat CTAP if pain persists or worsens.   - S/p dilaudid 0.5mg  x1, 0.25mg  x1  - IV dilaudid 0.25mg  q4 PRN  - NGT for decompression  - Repeat KUB  - PRN simethicone    DDKT in 2024 - SOB, volume overload  DCD, KDPI 56%, Basiliximab induction, CMV +/+, EBV +/+, standard anatomy. Post kidney transplant course has been notable for: E coli UTI.  Renal US 3/14 showed small new perinephric collection which may represent hematoma, complicated seroma, or abscess. Stable resistive indices in the renal transplant arteries, which are mildly elevated. Course c/b rapid response x2 for SOB, found to pulmonary edema and atelectasis. Has wide pulse pressure and refractory HTN controlled w/ several medications. Sensitive to volume shifts likely due to graft, possibly aortic stenosis, and current bacteremia.   -S/p IV lasix 80mg  x2  -Would gently diurese if desats  -NS 221mL/hr for 1 hour followed by for 9 hours iso GI losses  -Immunosuppression/prophylaxis stated above  -Transplant medicine and nephrology consulted, appreciate assistance    Acute on Chronic Thrombocytopenia- anemia on CKD  Unclear baseline appears to be between 90-100K, on presentation 72 K. Hgb 7.6 3/5 from 8.2 at admission. No bleeding noted. Platelets now 51 from 51 3/14. CTAP 3/14 showed hyperattenuation of the left ventricular myocardium suggestive of anemia.  -Daily CBC  -Iron studies sent    T2DM  Last A1C 5.6.  Home regimen Lantus 12 units nightly, lispro 3 units with meals, with sliding scale.  Will decrease insulin regimen given poor p.o.  intake. Glucose 108-120.  -Decrease Lantus 6 units nightly  -SSI  -Hold mealtime insulin    HTN  Will continue hypertension medication since patient is slightly hypertensive, will hold any nephrotoxic agents  -Decrease to clonidine 0.1 mg BID  -Start carvedilol 12.5 mg BID  -Hold amlodipine 10 mg daily  -Hold hydralazine 100 mg TID  -Hold chorthalidone 25 mg daily  -Hold spironolactone 25 mg daily    Chronic Problems  Hypothyroidism: Continue Synthroid      The patient's presentation is complicated by the following clinically significant conditions requiring additional evaluation and treatment: - Thrombocytopenia POA requiring further investigation or monitor  - Chronic kidney disease POA requiring further investigation, treatment, or monitoring   - Hypokalemia POA requiring further investigation, treatment, or monitoring  - Acidosis POA requiring further investigation, treatment, or monitoring     Issues Impacting Complexity of Management:  -High risk of complications from pain and/or analgesia likely to result in delirium  -The patient is at high risk for the development of complications of volume overload due to the need to provide IV hydration for suspected hypovolemia in the setting of: kidney transplant    Checklist:  Diet: Clear Liquid Diet  DVT PPx: Heparin 5000units q8h  Code Status: Full Code  Dispo: Patient appropriate for Inpatient based on expectation of ongoing need for hospitalization greater than two midnights based on severity of presentation/services including IV abx, BP mgmt    Team Contact Information:   Primary Team: Internal Medicine (MEDW)  Primary Resident: Quentin Cornwall, MD  Resident's Pager: [phone] (Gen MedW Intern - Alvester Morin)    Interval History:   BP 199/96, restarted home hydralazine. Dilaudid x1. New abdominal pain and distention that started last evening 3/15. Does not remember last time she had a BM. No N/V or flatulence. Does not have an appetite.     Objective:   Temp:  [36.8 ??C (98.3 ??F)-37.7 ??C (99.8 ??F)] 36.9 ??C (98.5 ??F)  Heart Rate:  [72-82] 75  SpO2 Pulse:  [70-82] 75  Resp:  [16-27] 25  BP: (131-195)/(25-45) 180/32  SpO2:  [94 %-100 %] 95 %    Gen: mild distress, uncomfortable  HENT: atraumatic, normocephalic, EOM grossly intact  Heart: RRR, systolic murmur  Lungs: bilateral crackles at lung bases, tachypneic on RA  Abdomen: soft, distended, not TTP, (+) BS  Extremities: No edema, wwp  Neuro: alert, oriented, no focal deficits appreciated

## 2022-10-08 NOTE — Unmapped (Signed)
Internal Medicine (MEDW) Progress Note    Assessment & Plan:   Julie [name] is a 72 y.o. female whose presentation is complicated by DDKT, HTN, hypothyroidism, T2DM that presented to [facility] with fevers, nausea and vomiting, diarrhea found to have Gram Negative Bacteremia.     Active Problems  E. Coli bacteremia, bacteruria iso immunocompromised status - Perinephric fluid collection - Fever, N/V/D  Patient presenting with 48-hour history of fevers, febrile in ED 100.4, 24-hour history of nausea/vomiting and diarrhea.  No leukocytosis on labs, though WBC is elevated from baseline (10.6 3/14 usually ~5-7) patient does have left shift with neutrophils of 9.7.  Unclear etiology most likely gastroenteritis given acute nausea vomiting diarrhea, could also be secondary to gram-negative bacteremia.  Lower suspicion of respiratory, urinary source given patient is asymptomatic. CMV, EMV (-). Bcx, Ucx (+) E.coli. UA 3/14 positive for leukocyte esterase and WBC. CTAP 3/14 showed perinephric collection consistent with previous renal US and c/f renal infection, possibly pyelonephritis was not definitive given lack of contrast. VIR consulted for drainage of perinephric fluid collection for culture and possible source control, likely 3/18. Will engage in multidisciplinary discussion about utility of perinephric fluid collection given clinical improvement on Abx.    -ID consulted, appreciate assistance   -C diff, GIPP need to be collected  -Continue meropenem  -3/16 Bcx NGTD  -Repeat ultrasound TXP kidney    SBO, resolving  Evening 3/15 new onset abd pain, distention. Exam (-) peritonitis, (+) BS. KUB shows increasing small bowel dilatation up to 4.2 cm with relative decompression of the colon. CTAP 3/14 showed no evidence of SBO or inflammation of GI tract. Repeat CXR 3/16 suggests ileus related to left lower quadrant transplant. CTAP 3/16 shows concern for developing high grade small bowel obstruction d/t adhesions. Unable to assess for ischemia given lack of IV contrast. General surgery consulted, placed NGT with LIWS. Patient is NPO. No acute surgical intervention needed at this time. Repeat lactate 3/17 wnl. Afternoon 3/17 had a large BM, will touch base w/ surgery about need for NGT, NPO status.   - S/p dilaudid 0.5mg  x1, 0.25mg  x1  - IV dilaudid 0.25mg  q4 PRN  - NGT for decompression  - PRN simethicone  - d/c IV acetaminophen    DDKT in 2024 - SOB, volume overload  DCD, KDPI 56%, Basiliximab induction, CMV +/+, EBV +/+, standard anatomy. Post kidney transplant course has been notable for: E coli UTI.  Renal US 3/14 showed small new perinephric collection which may represent hematoma, complicated seroma, or abscess. Stable resistive indices in the renal transplant arteries, which are mildly elevated. Course c/b rapid response x2 for SOB, found to pulmonary edema and atelectasis. Has wide pulse pressure and refractory HTN controlled w/ several medications. Sensitive to volume shifts likely due to graft, possibly aortic stenosis, and current bacteremia.   -S/p IV lasix 80mg  x2  -Would gently diurese if desats  - NS with KCL 21mEq/L 116mL/hr for 10 hours  -s/p NS 227mL/hr for 1 hour followed by for 9 hours iso GI losses  -Continue bactrim for PJP prophylaxis  -Continue valganciclovir for CMV prophylaxis  -Continue tacrolimus, PTD  -Continue IV cellcept 250mg  BID  -Swtich to IV methylprednisolone 5mg  daily  - Bladder US pending  - Dc sodium bicarb  - Immunosuppression/prophylaxis stated above  -Transplant medicine and nephrology consulted, appreciate assistance    Acute on Chronic Thrombocytopenia- Anemia on CKD  Unclear baseline appears to be between 90-100K, on presentation 72 K. Hgb  7.6 3/5 from 8.2 at admission. No bleeding noted. Platelets now 51 from 51 3/14. CTAP 3/14 showed hyperattenuation of the left ventricular myocardium suggestive of anemia. Iron panel 3/16 showed Fe 34, TIBC 173, and Fe sat of 20. Will defer iron repletion at this time.   -Daily CBC    HTN  Systolics ranging from 150-200s. At home, HTN 160-170 with home medications. Reassuring in a sense that pt is not hypotensive from bacteremia. Working w/ txp nephrology in titrating medications.   -Continue clonidine 0.1 mg BID  -Increase to carvedilol 25 mg BID  -Start  amlodipine 5 mg BID  -Hold hydralazine 100 mg TID  -Hold chorthalidone 25 mg daily  -Hold spironolactone 25 mg daily    T2DM  Last A1C 5.6.  Home regimen Lantus 12 units nightly, lispro 3 units with meals, with sliding scale.  Will decrease insulin regimen given poor p.o. intake. Glucose 108-120.  -Decrease Lantus 6 units nightly  -SSI  -Hold mealtime insulin      Chronic Problems  Hypothyroidism: Continue Synthroid      The patient's presentation is complicated by the following clinically significant conditions requiring additional evaluation and treatment: - Thrombocytopenia POA requiring further investigation or monitor  - Chronic kidney disease POA requiring further investigation, treatment, or monitoring   - Hypokalemia POA requiring further investigation, treatment, or monitoring  - Acidosis POA requiring further investigation, treatment, or monitoring     Issues Impacting Complexity of Management:  -High risk of complications from pain and/or analgesia likely to result in delirium  -The patient is at high risk for the development of complications of volume overload due to the need to provide IV hydration for suspected hypovolemia in the setting of: kidney transplant    Checklist:  Diet: Clear Liquid Diet  DVT PPx: Heparin 5000units q8h  Code Status: Full Code  Dispo: Patient appropriate for Inpatient based on expectation of ongoing need for hospitalization greater than two midnights based on severity of presentation/services including IV abx, BP mgmt    Team Contact Information:   Primary Team: Internal Medicine (MEDW)  Primary Resident: Quentin Cornwall, MD  Resident's Pager: [phone] (Gen MedW Intern - Alvester Morin)    Interval History:   Continued abdominal pain and distention that started 3/15. Is not sure if her symptoms have resolved since yesterday. Has mild abdominal pain with distention. Was sitting up in chair at time of our visit.    Objective:   Temp:  [36.5 ??C (97.7 ??F)-37.4 ??C (99.4 ??F)] 36.5 ??C (97.7 ??F)  Heart Rate:  [72-77] 73  SpO2 Pulse:  [72-77] 73  Resp:  [15-29] 19  BP: (122-209)/(24-55) 183/38  SpO2:  [94 %-100 %] 96 %    Gen: sitting up in chair, some discomfort  HENT: atraumatic, normocephalic, EOM grossly intact  Heart: RRR, systolic murmur  Lungs: bilateral crackles at lung bases  Abdomen: soft, distended, not TTP, (+) BS  Extremities: No edema, wwp  Neuro: alert, oriented, no focal deficits appreciated      Mercer Pod, MS3    I attest that I have reviewed the medical student note and that the components of the history of the present illness, the physical exam, and the assessment and plan documented were performed by me or were performed in my presence by the student where I verified the documentation and performed (or re-performed) the exam and medical decision making. Quentin Cornwall, MD

## 2022-10-08 NOTE — Unmapped (Signed)
VASCULAR INTERVENTIONAL RADIOLOGY  Drainage/Aspiration Consultation     Requesting Attending Physician: [name], MD  Service Requesting Consult: [facility] (MDW)    Date of Service: 10/08/2022  Consulting Interventional Radiologist: Dr. Georgeann Oppenheim      HPI:     Reason for Consultation: Aspiration  of transplant kidney perinephric collection    Julie [name] is a 72 y.o. 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 She is currently admitted with UTI  and bacteremia. US showed a perinephric collection measuring 8.1 x 0.8 x 2.3 cm along the posterolateral and inferior aspect of the LLQ transplant kidney. This collection is new since renal transplant ultrasound on 08/21/2022. Bcx 3/14 positive for E. Coli. ID recommends sampling fluid collection due to bacteremia and immunocompromised status to rule out abscess. Patient seen in consultation at the request of primary care team for consideration for aspiration of LLQ transplant kidney perinephric fluid collection.      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)     since July 2017    Hypertension     Hypothyroidism (acquired)     Kidney transplant status, cadaveric 08/08/2022    Type 2 diabetes mellitus (CMS-HCC)        Surgical History:  Past Surgical History:   Procedure Laterality Date    CATARACT EXTRACTION      HYSTERECTOMY      OOPHORECTOMY      PERITONEAL CATHETER INSERTION      PR LAP INSERTION TUNNELED INTRAPERITONEAL CATHETER N/A 09/27/2018    Procedure: LAPAROSCOPY, SURGICAL; WITH INSERTION OF INTRAPERITONEAL CANNULA OR CATHETER, PERMANENT;  Surgeon: Leona Carry, MD;  Location: MAIN OR [place];  Service: Transplant    PR LAP INSERTION TUNNELED INTRAPERITONEAL CATHETER N/A 04/08/2019    Procedure: LAPAROSCOPY, SURGICAL; WITH INSERTION OF INTRAPERITONEAL CANNULA OR CATHETER, PERMANENT;  Surgeon: Leona Carry, MD; Location: MAIN OR [facility];  Service: Transplant    PR LAP REVISE INTRAPERITONEAL CATHETER N/A 08/26/2019    Procedure: LAPAROSCOPY, SURGICAL; W/REVIS PREV PLACED INTRAPERITONEAL CANNULA/CATH, REMOV INTRALUMIN OBSTRUCT MATERIAL;  Surgeon: Leona Carry, MD;  Location: MAIN OR [facility];  Service: Transplant    PR REMOVAL TUNNELED INTRAPERITONEAL CATHETER N/A 05/06/2018    Procedure: REMOVAL OF PERMANENT INTRAPERITONEAL CANNULA OR CATHETER;  Surgeon: Leona Carry, MD;  Location: MAIN OR [facility];  Service: Transplant    PR REMOVE PERITONEAL FOREIGN BODY N/A 04/08/2019    Procedure: Removal Of Peritoneal Of Foreign Body From Peritoneal Cavity;  Surgeon: Leona Carry, MD;  Location: MAIN OR [facility];  Service: Transplant    PR TRANSPLANT,PREP RENAL GRAFT/ARTERIAL Right 08/06/2022    Procedure: [facility] RECONSTRUCTION CADAVER/LIVING DONOR RENAL ALLOGRAFT PRIOR TO TRANSPLANT; ARTERIAL ANASTOMOSIS EAC;  Surgeon: Toledo, Lilyan Punt, MD;  Location: MAIN OR [facility];  Service: Transplant    PR TRANSPLANTATION OF KIDNEY Right 08/06/2022    Procedure: RENAL ALLOTRANSPLANTATION, IMPLANTATION OF GRAFT; WITHOUT RECIPIENT NEPHRECTOMY;  Surgeon: Toledo, Lilyan Punt, MD;  Location: MAIN OR [facility];  Service: Transplant    TUBAL LIGATION         Family History:  Family History   Problem Relation Age of Onset    Diabetes Mother     Alzheimer's disease Father     Diabetes Sister     Diabetes Sister     Diabetes Sister  Anesthesia problems Neg Hx     Bleeding Disorder Neg Hx        Medications:   Current Facility-Administered Medications   Medication Dose Route Frequency Provider Last Rate Last Admin    acetaminophen (OFIRMEV) 10 mg/mL injection 650 mg 65 mL  650 mg Intravenous Q6H Gracelyn Nurse, MD   Stopped at 10/08/22 0443    amlodipine (NORVASC) tablet 5 mg  5 mg Oral BID Gracelyn Nurse, MD   5 mg at 10/08/22 0526    atorvastatin (LIPITOR) tablet 10 mg  10 mg Oral Daily Gracelyn Nurse, MD 10 mg at 10/07/22 0855    carvedilol (COREG) tablet 12.5 mg  12.5 mg Oral BID Valeta Harms, MD        cloNIDine HCL (CATAPRES) tablet 0.1 mg  0.1 mg Oral BID Valeta Harms, MD        dextrose (D10W) 10% bolus 125 mL  12.5 g Intravenous Q10 Min PRN Gracelyn Nurse, MD        glucagon injection 1 mg  1 mg Intramuscular Once PRN Gracelyn Nurse, MD        glucose chewable tablet 16 g  16 g Oral Q10 Min PRN Gracelyn Nurse, MD        guaiFENesin (ROBITUSSIN) oral syrup  200 mg Oral Q4H PRN Gracelyn Nurse, MD        heparin (porcine) 5,000 unit/mL injection 5,000 Units  5,000 Units Subcutaneous [facility] Gracelyn Nurse, MD   5,000 Units at 10/08/22 0527    HYDROmorphone (PF) injection Syrg 0.25 mg  0.25 mg Intravenous Q4H PRN Valeta Harms, MD   0.25 mg at 10/07/22 1959    insulin glargine (LANTUS) injection 6 Units  6 Units Subcutaneous Nightly Gracelyn Nurse, MD   6 Units at 10/07/22 2040    insulin lispro (HumaLOG) injection 0-20 Units  0-20 Units Subcutaneous ACHS Gracelyn Nurse, MD   1 Units at 10/07/22 2057    labetalol (NORMODYNE) injection  10 mg Intravenous Q6H PRN Valeta Harms, MD   10 mg at 10/08/22 0044    levothyroxine (SYNTHROID) tablet 112 mcg  112 mcg Oral daily Gracelyn Nurse, MD   112 mcg at 10/08/22 1610    magnesium oxide-Mg AA chelate (Magnesium Plus Protein) 2 tablet  2 tablet Oral BID Daine Floras, MD   2 tablet at 10/07/22 2039    melatonin tablet 3 mg  3 mg Oral Nightly PRN Gracelyn Nurse, MD        meropenem (MERREM) 1 g in sodium chloride 0.9 % (NS) 100 mL IVPB-MBP  1 g Intravenous Q12H Valeta Harms, MD   Stopped at 10/07/22 2109    mycophenolate (CELLCEPT) 250 mg in dextrose 5 % 100 mL IVPB  250 mg Intravenous Q12H Gracelyn Nurse, MD        ondansetron (ZOFRAN-ODT) disintegrating tablet 4 mg  4 mg Oral Q8H PRN Gracelyn Nurse, MD        Or    ondansetron [facility]) injection 4 mg  4 mg Intravenous Q8H PRN Gracelyn Nurse, MD   4 mg at 10/07/22 1514    pantoprazole (Protonix) EC tablet 40 mg  40 mg Oral Daily Gracelyn Nurse, MD   40 mg at 10/07/22 0856    predniSONE (DELTASONE) tablet 5 mg  5 mg Oral Daily Gracelyn Nurse, MD   5 mg at 10/07/22 [phone]  simethicone (MYLICON) chewable tablet 80 mg  80 mg Oral Q6H PRN Valeta Harms, MD   80 mg at 10/07/22 1200    sodium bicarbonate tablet 650 mg  650 mg Oral BID Daine Floras, MD   650 mg at 10/07/22 2039    sulfamethoxazole-trimethoprim (BACTRIM) 400-80 mg tablet 80 mg of trimethoprim  1 tablet Oral Once per day on Monday Wednesday Friday Gracelyn Nurse, MD   80 mg of trimethoprim at 10/06/22 0845    tacrolimus (PROGRAF) capsule 2 mg  2 mg Oral Nightly Valeta Harms, MD   2 mg at 10/07/22 1959    tacrolimus (PROGRAF) capsule 3 mg  3 mg Oral Daily Valeta Harms, MD   3 mg at 10/07/22 1610    valGANciclovir (VALCYTE) tablet 450 mg  450 mg Oral Every other day Gracelyn Nurse, MD   450 mg at 10/07/22 9604       Allergies:  Oxycodone, Penicillins, and Valacyclovir    Social History:  Social History     Tobacco Use    Smoking status: Former     Current packs/day: 0.00     Average packs/day: 0.8 packs/day for 30.0 years (22.5 ttl pk-yrs)     Types: Cigarettes     Start date: 06/06/1976     Quit date: 06/06/2006     Years since quitting: 16.3    Smokeless tobacco: Former   Substance Use Topics    Alcohol use: Yes     Comment: only special occasions    Drug use: No       Objective:    Pertinent Laboratory Values:  WBC   Date Value Ref Range Status   10/07/2022 6.7 3.6 - 11.2 10*9/L Final   10/03/2022 5.9 3.4 - 10.8 x10E3/uL Final     HGB   Date Value Ref Range Status   10/07/2022 10.0 (L) 11.3 - 14.9 g/dL Final   54/03/8118 8.7 (L) 11.1 - 15.9 g/dL Final     Hemoglobin   Date Value Ref Range Status   08/21/2022 9.2 (L) 12.0 - 16.0 g/dL Final     HCT   Date Value Ref Range Status   10/07/2022 29.4 (L) 34.0 - 44.0 % Final   10/03/2022 27.3 (L) 34.0 - 46.6 % Final     Platelet   Date Value Ref Range Status   10/07/2022 65 (L) 150 - 450 10*9/L Final   10/03/2022 97 (LL) 150 - 450 x10E3/uL Final     Comment:     Actual platelet count may be somewhat higher than reported due to  aggregation of platelets in this sample.       INR   Date Value Ref Range Status   08/06/2022 0.92  Final     Creatinine   Date Value Ref Range Status   10/07/2022 1.93 (H) 0.55 - 1.02 mg/dL Final   14/78/2956 2.13 (H) 0.57 - 1.00 mg/dL Final       Imaging Reviewed:  CT A/P 3/7 and 3/14  Renal Transplant Ultrasound 1/29    Febrile:  No    Physical Exam:    Vitals:    10/08/22 0428   BP: 165/29   Pulse: 73   Resp: 23   Temp:    SpO2: 98%     General: No apparent distress.  Lungs: Breathing comfortably on 2L [place].  Heart:  Regular rate and rhythm.  Neuro: No obvious focal deficits.    ASA Grade: ASA 2 -  Patient with mild systemic disease with no functional limitations  Airway assessment: Class 3 - Can visualize soft palate    Assessment and Recommendations:     Ms. Ohmer is a 72 y.o. female with ESRD s/p DDKT on 08/06/22, currently admitted with UTI and found to have a transplant kidney perinephric fluid collection.    - Primary team currently discussing with Transplant team if they still want aspiration. Please contact [facility] once a decision is made.  - Informed consent obtained and scanned into chart in the event that aspiration is needed.     Informed Consent:  This procedure and sedation has been fully reviewed with the patient/patient???s authorized representative. The risks, benefits and alternatives including but not limited to bleeding, infection, damage to adjacent structures, have been explained, and the patient/patient???s authorized representative has consented to the procedure. Consent obtained by Everrett Coombe, witnessed and scanned into patient's medical record.   --The patient will accept blood products in an emergent situation.  --The patient does not have a Do Not Resuscitate order in effect.    The patient was discussed with Dr. Georgeann Oppenheim .     Thank you for involving [place] in the care of this patient. Please page the [facility] consult pager [phone]) with further questions, concerns, or if new issues arise.    Everrett Coombe, MD  Radiology PGY-5

## 2022-10-08 NOTE — Unmapped (Signed)
Problem: Adult Inpatient Plan of Care  Goal: Plan of Care Review  Outcome: Progressing  Goal: Patient-Specific Goal (Individualized)  Outcome: Progressing  Goal: Absence of Hospital-Acquired Illness or Injury  Outcome: Progressing  Goal: Optimal Comfort and Wellbeing  Outcome: Progressing  Goal: Readiness for Transition of Care  Outcome: Progressing  Goal: Rounds/Family Conference  Outcome: Progressing     Problem: Infection  Goal: Absence of Infection Signs and Symptoms  Outcome: Progressing     Problem: Fall Injury Risk  Goal: Absence of Fall and Fall-Related Injury  Outcome: Progressing     Problem: Self-Care Deficit  Goal: Improved Ability to Complete Activities of Daily Living  Outcome: Progressing     Problem: Comorbidity Management  Goal: Maintenance of Asthma Control  Outcome: Progressing  Goal: Maintenance of Behavioral Health Symptom Control  Outcome: Progressing  Goal: Maintenance of COPD Symptom Control  Outcome: Progressing  Goal: Blood Glucose Levels Within Targeted Range  Outcome: Progressing  Goal: Maintenance of Heart Failure Symptom Control  Outcome: Progressing  Goal: Blood Pressure in Desired Range  Outcome: Progressing  Goal: Maintenance of Osteoarthritis Symptom Control  Outcome: Progressing  Intervention: Maintain Osteoarthritis Symptom Control  Recent Flowsheet Documentation  Taken 10/08/2022 1332 by Hughie Closs, RN  Activity Management: up to bedside commode  Taken 10/08/2022 1244 by Hughie Closs, RN  Activity Management: back to bed  Taken 10/08/2022 0900 by Hughie Closs, RN  Activity Management: up in chair  Goal: Bariatric Home Regimen Maintained  Outcome: Progressing  Goal: Maintenance of Seizure Control  Outcome: Progressing     Problem: Skin Injury Risk Increased  Goal: Skin Health and Integrity  Outcome: Progressing  Intervention: Optimize Skin Protection  Recent Flowsheet Documentation  Taken 10/08/2022 1332 by Hughie Closs, RN  Activity Management: up to bedside commode  Taken 10/08/2022 1244 by Hughie Closs, RN  Activity Management: back to bed  Taken 10/08/2022 0900 by Hughie Closs, RN  Activity Management: up in chair

## 2022-10-08 NOTE — Unmapped (Signed)
General Surgery Progress Note      Assessment:   72 y.o. female with complicated previous medical history status post DCD DDKT in 07/2022, hypertension, type 2 diabetes, hypothyroidism, who was admitted for GNR bacteremia. General surgery consulted 3/17 as patient was found to have dilated loops of small bowel with multiple transition points in the RLE on CT, concerning for SBO. Patient examined again this morning with reassuring abdominal exam. 1300cc of total NGT output in last 24 hours.     Plan:   -Continue NPO with NGT LIWS  -Continue serial abdominal exams  -No acute indications for surgical intervention at this time  -We will continue to follow    Interval/Subjective:  General surgery consulted overnight. This AM, denies nausea/emesis. Endorsing abdominal pain. Denies flatus or bowel movements.       Objective:     Vital signs in last 24 hours:  Temp:  [36.5 ??C (97.7 ??F)-37.4 ??C (99.4 ??F)] 36.5 ??C (97.7 ??F)  Heart Rate:  [72-77] 73  SpO2 Pulse:  [72-77] 73  Resp:  [15-29] 19  BP: (122-209)/(24-55) 183/38  MAP (mmHg):  [58-105] 91  SpO2:  [94 %-100 %] 96 %    Intake/Output last 24 hours:  I/O last 3 completed shifts:  In: 670 [P.O.:240; IV Piggyback:430]  Out: 2800 [Urine:1500; Emesis/NG output:1300]    Physical Exam:  General: Well-appearing and in no acute distress; NGT in place with bilious drainage  Pulm: Normal respiratory effort on 1L [place]  Cardiac: regular rate and rhythm   Abdomen: Soft, non-tender, mildly distended. Well-healed prior surgical scars.       Data Review:    I have reviewed the labs and studies from the last 24 hours.      Imaging: Radiology studies were personally reviewed

## 2022-10-09 ENCOUNTER — Ambulatory Visit: Admit: 2022-10-09 | Payer: MEDICARE

## 2022-10-09 DIAGNOSIS — Z940 Kidney transplant status: Principal | ICD-10-CM

## 2022-10-09 LAB — RENAL FUNCTION PANEL
ALBUMIN: 2.9 g/dL — ABNORMAL LOW (ref 3.4–5.0)
ANION GAP: 10 mmol/L (ref 5–14)
BLOOD UREA NITROGEN: 47 mg/dL — ABNORMAL HIGH (ref 9–23)
BUN / CREAT RATIO: 29
CALCIUM: 9 mg/dL (ref 8.7–10.4)
CHLORIDE: 106 mmol/L (ref 98–107)
CO2: 20 mmol/L (ref 20.0–31.0)
CREATININE: 1.64 mg/dL — ABNORMAL HIGH
EGFR CKD-EPI (2021) FEMALE: 33 mL/min/{1.73_m2} — ABNORMAL LOW (ref >=60)
GLUCOSE RANDOM: 120 mg/dL (ref 70–179)
PHOSPHORUS: 2.9 mg/dL (ref 2.4–5.1)
POTASSIUM: 4.8 mmol/L (ref 3.4–4.8)
SODIUM: 136 mmol/L (ref 135–145)

## 2022-10-09 LAB — LACTATE, VENOUS, WHOLE BLOOD
LACTATE BLOOD VENOUS: 0.6 mmol/L (ref 0.5–1.8)
LACTATE BLOOD VENOUS: 0.5 mmol/L (ref 0.5–1.8)

## 2022-10-09 LAB — TACROLIMUS LEVEL, TROUGH: TACROLIMUS, TROUGH: 10.1 ng/mL (ref 5.0–15.0)

## 2022-10-09 LAB — CBC
HEMATOCRIT: 23.4 % — ABNORMAL LOW (ref 34.0–44.0)
HEMOGLOBIN: 8 g/dL — ABNORMAL LOW (ref 11.3–14.9)
MEAN CORPUSCULAR HEMOGLOBIN CONC: 34.2 g/dL (ref 32.0–36.0)
MEAN CORPUSCULAR HEMOGLOBIN: 32 pg (ref 25.9–32.4)
MEAN CORPUSCULAR VOLUME: 93.6 fL (ref 77.6–95.7)
MEAN PLATELET VOLUME: 8.9 fL (ref 6.8–10.7)
PLATELET COUNT: 71 10*9/L — ABNORMAL LOW (ref 150–450)
RED BLOOD CELL COUNT: 2.5 10*12/L — ABNORMAL LOW (ref 3.95–5.13)
RED CELL DISTRIBUTION WIDTH: 15.7 % — ABNORMAL HIGH (ref 12.2–15.2)
WBC ADJUSTED: 3.8 10*9/L (ref 3.6–11.2)

## 2022-10-09 LAB — MAGNESIUM: MAGNESIUM: 1.9 mg/dL (ref 1.6–2.6)

## 2022-10-09 MED ADMIN — tacrolimus (PROGRAF) capsule 1 mg: 1 mg | SUBLINGUAL | @ 02:00:00

## 2022-10-09 MED ADMIN — phenol (CHLORASEPTIC) 1.4 % spray 2 spray: 2 | @ 20:00:00

## 2022-10-09 MED ADMIN — levothyroxine (SYNTHROID) tablet 112 mcg: 112 ug | ORAL | @ 11:00:00

## 2022-10-09 MED ADMIN — insulin lispro (HumaLOG) injection 0-20 Units: 0-20 [IU] | SUBCUTANEOUS | @ 17:00:00

## 2022-10-09 MED ADMIN — heparin (porcine) 5,000 unit/mL injection 5,000 Units: 5000 [IU] | SUBCUTANEOUS | @ 11:00:00

## 2022-10-09 MED ADMIN — magnesium oxide-Mg AA chelate (Magnesium Plus Protein) 2 tablet: 2 | ORAL | @ 13:00:00

## 2022-10-09 MED ADMIN — carvedilol (COREG) tablet 25 mg: 25 mg | ORAL | @ 02:00:00

## 2022-10-09 MED ADMIN — cloNIDine HCL (CATAPRES) tablet 0.1 mg: .1 mg | ORAL | @ 15:00:00

## 2022-10-09 MED ADMIN — acetaminophen (OFIRMEV) 10 mg/mL injection 1,000 mg: 1000 mg | INTRAVENOUS | @ 20:00:00 | Stop: 2022-10-10

## 2022-10-09 MED ADMIN — valGANciclovir (VALCYTE) tablet 450 mg: 450 mg | ORAL | @ 13:00:00

## 2022-10-09 MED ADMIN — ondansetron (ZOFRAN-ODT) disintegrating tablet 4 mg: 4 mg | ORAL | @ 08:00:00

## 2022-10-09 MED ADMIN — atorvastatin (LIPITOR) tablet 10 mg: 10 mg | ORAL | @ 13:00:00

## 2022-10-09 MED ADMIN — sulfamethoxazole-trimethoprim (BACTRIM) 400-80 mg tablet 80 mg of trimethoprim: 1 | ORAL | @ 15:00:00 | Stop: 2023-01-15

## 2022-10-09 MED ADMIN — labetalol (NORMODYNE) injection: 20 mg | INTRAVENOUS | @ 02:00:00

## 2022-10-09 MED ADMIN — carvedilol (COREG) tablet 25 mg: 25 mg | ORAL | @ 15:00:00

## 2022-10-09 MED ADMIN — magnesium oxide-Mg AA chelate (Magnesium Plus Protein) 2 tablet: 2 | ORAL | @ 02:00:00

## 2022-10-09 MED ADMIN — mycophenolate (CELLCEPT) 250 mg in dextrose 5 % 100 mL IVPB: 250 mg | INTRAVENOUS | @ 02:00:00

## 2022-10-09 MED ADMIN — amlodipine (NORVASC) tablet 5 mg: 5 mg | ORAL | @ 15:00:00

## 2022-10-09 MED ADMIN — sodium chloride (NS) 0.9 % infusion: 75 mL/h | INTRAVENOUS | @ 17:00:00 | Stop: 2022-10-09

## 2022-10-09 MED ADMIN — heparin (porcine) 5,000 unit/mL injection 5,000 Units: 5000 [IU] | SUBCUTANEOUS | @ 18:00:00

## 2022-10-09 MED ADMIN — pantoprazole (Protonix) EC tablet 40 mg: 40 mg | ORAL | @ 13:00:00

## 2022-10-09 MED ADMIN — phenol (CHLORASEPTIC) 1.4 % spray 2 spray: 2 | @ 18:00:00

## 2022-10-09 MED ADMIN — tacrolimus (PROGRAF) 1.5mg combo product: 1.5 mg | SUBLINGUAL | @ 13:00:00

## 2022-10-09 MED ADMIN — meropenem (MERREM) 1 g in sodium chloride 0.9 % (NS) 100 mL IVPB-MBP: 1 g | INTRAVENOUS | @ 13:00:00 | Stop: 2022-10-11

## 2022-10-09 MED ADMIN — methylPREDNISolone sodium succinate (PF) (SOLU-Medrol) injection 5.2 mg: 5 mg | INTRAVENOUS | @ 11:00:00

## 2022-10-09 MED ADMIN — HYDROmorphone (PF) injection Syrg 0.25 mg: .25 mg | INTRAVENOUS | @ 08:00:00 | Stop: 2022-10-21

## 2022-10-09 MED ADMIN — acetaminophen (OFIRMEV) 10 mg/mL injection 1,000 mg: 1000 mg | INTRAVENOUS | @ 11:00:00 | Stop: 2022-10-10

## 2022-10-09 MED ADMIN — cloNIDine HCL (CATAPRES) tablet 0.1 mg: .1 mg | ORAL | @ 02:00:00

## 2022-10-09 MED ADMIN — meropenem (MERREM) 1 g in sodium chloride 0.9 % (NS) 100 mL IVPB-MBP: 1 g | INTRAVENOUS | @ 02:00:00 | Stop: 2022-10-11

## 2022-10-09 MED ADMIN — mycophenolate (CELLCEPT) 250 mg in dextrose 5 % 100 mL IVPB: 250 mg | INTRAVENOUS | @ 15:00:00 | Stop: 2022-10-09

## 2022-10-09 NOTE — Unmapped (Signed)
Tacrolimus Therapeutic Monitoring Pharmacy Note    Julie [name] is a 72 y.o. female continuing tacrolimus.     Indication: Kidney transplant     Date of Transplant: 08/06/2022     Prior Dosing Information: Current regimen 1.5 mg sublingual in AM and 1 mg sublingual in PM. Prior outpatient regimen was oral capsule (Prograf) 2 mg bid.       Source(s) of information used to determine prior to admission dosing: Home Medication List or Clinic Note    Goals:  Therapeutic Drug Levels  Tacrolimus trough goal: 7-9 ng/mL     Additional Clinical Monitoring/Outcomes  Monitor renal function (SCr and urine output) and liver function (LFTs)  Monitor for signs/symptoms of adverse events (e.g., hyperglycemia, hyperkalemia, hypomagnesemia, hypertension, headache, tremor)    Results:   Tacrolimus level:  10.1 ng/mL, drawn early 3/18 at 0601 and last dose given late on 3/17 at 2143, representing an ~8 hour post-dose level.     Pharmacokinetic Considerations and Significant Drug Interactions:  Concurrent hepatotoxic medications: None identified  Concurrent CYP3A4 substrates/inhibitors: None identified  Concurrent nephrotoxic medications: None identified    Assessment/Plan:  Recommendedation(s)  After discussion with nephrology, favor continuing current regimen given level is not a true trough and likely to be within goal of 7-9 ng/mL.   Continue current regimen of 1.5 mg SL in AM and 1 mg SL in PM.    Tacrolimus administration is ordered for 0800/2000. Will adjust trough level to be drawn at 0730 immediately prior to AM dose.     Follow-up  Daily levels ordered x 4 days.    A pharmacist will continue to monitor and recommend levels as appropriate    Please page service pharmacist with questions/clarifications.    Pamella Pert, PharmD Candidate     Charlestine Massed, PharmD  PGY1 Pharmacy Resident - Ambulatory Care Track

## 2022-10-09 NOTE — Unmapped (Cosign Needed)
Pt A&Ox 4. VSS on 1L [place]. NSR. PIV remains in place. NG tube returned to intermittent suctioning due to complaints of stomach discomfort. Pt refused Heparin, education provided. Moderate urine output, no BM this shift. PRN labetalol provided x2 for hypertension. Telesitter remains in place. Patient in bed at this time.   Problem: Adult Inpatient Plan of Care  Goal: Absence of Hospital-Acquired Illness or Injury  Intervention: Identify and Manage Fall Risk  Recent Flowsheet Documentation  Taken 10/08/2022 2000 by Jaquelyn Bitter, CNA  Safety Interventions:   bed alarm   low bed   lighting adjusted for tasks/safety  Intervention: Prevent Skin Injury  Recent Flowsheet Documentation  Taken 10/09/2022 0000 by Jaquelyn Bitter, CNA  Positioning for Skin: Supine/Back  Device Skin Pressure Protection: absorbent pad utilized/changed  Skin Protection: adhesive use limited  Taken 10/08/2022 2330 by Jaquelyn Bitter, CNA  Positioning for Skin: Supine/Back  Device Skin Pressure Protection: absorbent pad utilized/changed  Skin Protection: adhesive use limited  Taken 10/08/2022 2200 by Jaquelyn Bitter, CNA  Positioning for Skin: Supine/Back  Device Skin Pressure Protection: absorbent pad utilized/changed  Skin Protection: adhesive use limited  Taken 10/08/2022 2000 by Jaquelyn Bitter, CNA  Positioning for Skin: Supine/Back  Device Skin Pressure Protection:   absorbent pad utilized/changed   adhesive use limited   skin-to-skin areas padded   tubing/devices free from skin contact  Skin Protection: adhesive use limited  Intervention: Prevent Infection  Recent Flowsheet Documentation  Taken 10/08/2022 2000 by Jaquelyn Bitter, CNA  Infection Prevention: rest/sleep promoted     Problem: Infection  Goal: Absence of Infection Signs and Symptoms  Intervention: Prevent or Manage Infection  Recent Flowsheet Documentation  Taken 10/08/2022 2000 by Jaquelyn Bitter, CNA  Infection Management: aseptic technique maintained  Isolation Precautions: enteric precautions maintained     Problem: Fall Injury Risk  Goal: Absence of Fall and Fall-Related Injury  Intervention: Promote Injury-Free Environment  Recent Flowsheet Documentation  Taken 10/08/2022 2000 by Jaquelyn Bitter, CNA  Safety Interventions:   bed alarm   low bed   lighting adjusted for tasks/safety     Problem: Skin Injury Risk Increased  Goal: Skin Health and Integrity  Intervention: Optimize Skin Protection  Recent Flowsheet Documentation  Taken 10/09/2022 0000 by Jaquelyn Bitter, CNA  Pressure Reduction Techniques: frequent weight shift encouraged  Pressure Reduction Devices: pressure-redistributing mattress utilized  Skin Protection: adhesive use limited  Taken 10/08/2022 2330 by Jaquelyn Bitter, CNA  Pressure Reduction Techniques: frequent weight shift encouraged  Pressure Reduction Devices: pressure-redistributing mattress utilized  Skin Protection: adhesive use limited  Taken 10/08/2022 2200 by Jaquelyn Bitter, CNA  Pressure Reduction Techniques: frequent weight shift encouraged  Pressure Reduction Devices: pressure-redistributing mattress utilized  Skin Protection: adhesive use limited  Taken 10/08/2022 2000 by Jaquelyn Bitter, CNA  Pressure Reduction Techniques: frequent weight shift encouraged  Pressure Reduction Devices: pressure-redistributing mattress utilized  Skin Protection: adhesive use limited

## 2022-10-09 NOTE — Unmapped (Signed)
Internal Medicine (MEDW) Progress Note    Assessment & Plan:   Julie [name] is a 72 y.o. female whose presentation is complicated by DDKT, HTN, hypothyroidism, T2DM that presented to [facility] with fevers, nausea and vomiting, diarrhea found to have E. Coli bacteremia. Course c/b SBO, fluid overload.     Active Problems  E. Coli bacteremia, bacteruria iso immunocompromised status - Perinephric fluid collection - Fever, N/V/D  Patient presenting with 48-hour history of fevers, febrile in ED 100.4, 24-hour history of nausea/vomiting and diarrhea.  No leukocytosis on labs, though WBC is elevated from baseline (10.6 3/14 usually ~5-7) patient does have left shift with neutrophils of 9.7.  Unclear etiology most likely gastroenteritis given acute nausea vomiting diarrhea, could also be secondary to gram-negative bacteremia.  Lower suspicion of respiratory, urinary source given patient is asymptomatic. CMV, EMV (-). Bcx, Ucx (+) E.coli. UA 3/14 positive for leukocyte esterase and WBC. CTAP 3/14 showed perinephric collection consistent with previous renal US and c/f renal infection, possibly pyelonephritis was not definitive given lack of contrast. VIR consulted for drainage of perinephric fluid collection for culture and possible source control, likely 3/18. Repeat ultrasound TXP kidney shows 8.1cm hematoma, possibly communicating 2.2cm complex collection on upper pole of kidney. Will pursue aspiration by VIR for culture.   -ID consulted, appreciate assistance   -C diff (-), GIPP needs to be collected  -Continue meropenem  -3/16 Bcx NGTD    SBO, resolving  Evening 3/15 new onset abd pain, distention. Exam (-) peritonitis, (+) BS. KUB shows increasing small bowel dilatation up to 4.2 cm with relative decompression of the colon. CTAP 3/14 showed no evidence of SBO or inflammation of GI tract. Repeat CXR 3/16 suggests ileus related to left lower quadrant transplant. CTAP 3/16 shows concern for developing high grade small bowel obstruction d/t adhesions. Unable to assess for ischemia given lack of IV contrast. General surgery consulted, placed NGT with LIWS. Patient is NPO. No acute surgical intervention needed at this time. Repeat lactate 3/17 wnl. Afternoon 3/17 had a large BM, per surgery will continue NGT, NPO status given high NGT output, no flatus.   - IV dilaudid 0.25mg  q4 PRN  - NGT for decompression  - PRN simethicone  - d/c IV acetaminophen    DDKT in 2024 - SOB, volume overload  DCD, KDPI 56%, Basiliximab induction, CMV +/+, EBV +/+, standard anatomy. Post kidney transplant course has been notable for: E coli UTI.  Renal US 3/14 showed small new perinephric collection which may represent hematoma, complicated seroma, or abscess. Stable resistive indices in the renal transplant arteries, which are mildly elevated. Course c/b rapid response x2 for SOB, found to pulmonary edema and atelectasis. Has wide pulse pressure and refractory HTN controlled w/ several medications. Sensitive to volume shifts likely due to graft, possibly aortic stenosis, and current bacteremia.   -S/p IV lasix 80mg  x2  -Would gently diurese if desats  - Continue 75cc/hr NS  -Continue bactrim for PJP prophylaxis  -Continue valganciclovir for CMV prophylaxis  -Continue sublingual tacrolimus, PTD  -Continue IV cellcept 250mg  BID  -Continue IV methylprednisolone 5mg  daily  - Bladder US pending  - Dc sodium bicarb  - Immunosuppression/prophylaxis stated above  -Transplant medicine and nephrology consulted, appreciate assistance    HTN  Systolics ranging from 150-200s. At home, HTN 160-170 with home medications. Reassuring in a sense that pt is not hypotensive from bacteremia. Working w/ txp nephrology in titrating medications.   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 Hgb 7.6 3/5 from 8.2 at admission. No bleeding noted. Platelets now 51 from 51 3/14. CTAP 3/14 showed hyperattenuation of the left ventricular myocardium suggestive of anemia. Iron panel 3/16 showed Fe 34, TIBC 173, and Fe sat of 20. Will defer iron repletion at this time.   -Daily CBC    T2DM  Last A1C 5.6.  Home regimen Lantus 12 units nightly, lispro 3 units with meals, with sliding scale.  Will decrease insulin regimen given poor p.o. intake. Glucose 108-120.  -Lantus 6 units nightly  -SSI  -Hold mealtime insulin      Chronic Problems  Hypothyroidism: Continue Synthroid      The patient's presentation is complicated by the following clinically significant conditions requiring additional evaluation and treatment: - Thrombocytopenia POA requiring further investigation or monitor  - Chronic kidney disease POA requiring further investigation, treatment, or monitoring   - Hypokalemia POA requiring further investigation, treatment, or monitoring  - Acidosis POA requiring further investigation, treatment, or monitoring     Issues Impacting Complexity of Management:  -High risk of complications from pain and/or analgesia likely to result in delirium  -The patient is at high risk for the development of complications of volume overload due to the need to provide IV hydration for suspected hypovolemia in the setting of: kidney transplant    Checklist:  Diet: NPO  DVT PPx: Heparin 5000units q8h  Code Status: Full Code  Dispo: Stepdown status    Team Contact Information:   Primary Team: Internal Medicine (MEDW)  Primary Resident: Quentin Cornwall, MD  Resident's Pager: [phone] (Gen MedW Intern - Alvester Morin)    Interval History:   Has abdominal discomfort, still feels pretty bloated. Breathing is okay. Had large BM yesterday. Has throat dryness, finds NGT uncomfortable.     Objective:   Temp:  [36.5 ??C (97.7 ??F)-37.3 ??C (99.1 ??F)] 36.6 ??C (97.9 ??F)  Heart Rate:  [66-79] 68  SpO2 Pulse:  [66-82] 66  Resp:  [16-23] 20  BP: (143-188)/(23-47) 143/24  SpO2:  [95 %-100 %] 100 %    Gen: sitting up in chair, NAD  HENT: atraumatic, normocephalic, EOM grossly intact  Heart: RRR, systolic murmur  Lungs: bilateral crackles at lung bases, breathing comfortably on 1L  Abdomen: soft, distended, not TTP, (+) BS  Extremities: No edema, wwp  Neuro: alert, oriented, no focal deficits appreciated

## 2022-10-09 NOTE — Unmapped (Signed)
General Surgery Progress Note      Assessment:   72 y.o. female with complicated previous medical history status post DCD DDKT in 07/2022, hypertension, type 2 diabetes, hypothyroidism, who was admitted for GNR bacteremia. General surgery consulted 3/17 as patient was found to have dilated loops of small bowel with multiple transition points in the RLE on CT, concerning for SBO. Patient examined again this morning with reassuring abdominal exam. Although patient reports BM, there is concern that patient's obstruction is not resolved given high volume NGT output and no passage of flatus. Recommend continue NGT decompression.    Plan:   -Continue NPO with NGT LIWS  -No acute indications for surgical intervention at this time  -We will continue to follow    Interval/Subjective:  NAEO. Patient states she had large volume diarrhea, but denies passing flatus. Continues to have copious NGT bilious output, 1.7L.       Objective:     Vital signs in last 24 hours:  Temp:  [36.5 ??C (97.7 ??F)-37.3 ??C (99.1 ??F)] 36.8 ??C (98.3 ??F)  Heart Rate:  [66-79] 66  SpO2 Pulse:  [66-82] 66  Resp:  [16-23] 20  BP: (151-188)/(23-47) 155/27  MAP (mmHg):  [73-92] 74  SpO2:  [95 %-100 %] 100 %    Intake/Output last 24 hours:  I/O last 3 completed shifts:  In: 3738.3 [P.O.:440; I.V.:2768.3; IV Piggyback:530]  Out: 3650 [Urine:1450; Emesis/NG output:2200]    Physical Exam:  General: Well-appearing and in no acute distress; NGT in place with bilious drainage  Pulm: Normal respiratory effort    Cardiac: regular rate and rhythm   Abdomen: Soft, minimally tender in RLQ/RUQ, mildly distended.        Data Review:    I have reviewed the labs and studies from the last 24 hours.      Imaging: Radiology studies were personally reviewed

## 2022-10-09 NOTE — Unmapped (Signed)
IMMUNOCOMPROMISED HOST INFECTIOUS DISEASE CONSULT NOTE    Julie [name] is being seen in consultation at the request of Johnsie Cancel, MD for evaluation of UTI in transplant patient.    Assessment/Recommendations:    Julie [name] is a 72 y.o. 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     Summary of pertinent prior infections:  #History of Shingles 2017  #04/2018 Dialysis fluid [place] R: Ampicillin, Unasyn, Cefazolin,S: Ceftriaxone, Gentamicin, Levo, PipTazo, Tobra  #Donor urine cx with C. Dubliensis 08/05/22, <10K, negative recipient samples, negative donor blood cx, treated with 2 weeks PO fluconazole  #E. Coli bacteremia and UTI 08/21/22  - Ucx/Bcx 1/29 E. Coli, R amp, amp/sulb, TMP/SMX. Susc CFZ, cipro/levo, gent/tobra, nitrofurantoin, tetra  - Treated with vanc/CFP -> 3 weeks levofloxacin through 2/26 (ureteral stent removed 2/15)     Active infection:  # MDR E. coli bacteremia 10/04/22, likely urinary source   # Perinephric fluid collection 10/05/22  - 3/12 developed fever and malaise at home, per family possible poor PO intake, diarrhea as well  - 3/14 UA 26 WBC, Ucx 10-50K E. Coli - adding on fosfomycin susc  - 3/14 Renal US 8.1 x 0.8 x 2.3 cm perinephric collection new from prior, considering small hematoma, complicated seroma, or less likely abscess  - 3/14 Bcx 2/2 E. Coli, susc-dose-dependent CFP, R cipro/levo, susc erta/mero, tobra, gent (R TMP/SMX, tetra, susc nitrofurantoin, mino)  - 3/14 CT A/P without contrast: perinephric stranding, 5.3 x 2.0 cm ill-defined perinephric collection limited evaluation without contrast. No stones seen. 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 Coli bacteremia presumed 2/2 UTI in last 6 weeks would be concerned about persistent nidus of infection, such as abscess. [place] and CT with new perinephric collection that doesn't appear classic for abscess, but would be concerned for involvement in the infection with 2 recent severe infections and that the collection was not present a few weeks back. Appreciate VIR assistance in pursuing safe drainage/sampling of this collection    Diagnosis  Follow-up repeat  Bcx 3/16 to ensure clearance (particularly with possible niduses of infection such as abscess, LUE graft, etc.)  Follow-up Ucx added susceptibilities from 3/14 for possible suppressive options in the future (added fosfomycin)  Appreciate VIR assistance with sampling of perinephric fluid collection given new from prior study and pt with 2 episodes of UTI with bacteremia  Please send fluid sample for bacterial culture  Consider inpatient vs. Outpatient referral to urogynecology given recurrent UTIs - can assist in evaluation for retention / urologic abnormalities that could be contributory    Management  Continue meropenem, renally dosed equivalent of 1g q8h, for now    Antimicrobial prophylaxis required for host deficiency: transplant immunosuppression  Viral: Valganciclovir  PJP: TMP/SMX MWF    Intensive toxicity monitoring for prescription antimicrobials   CBC w/diff at least once per week  CMP at least once per week  clinical assessments for rashes or other skin changes    The [facility] ID service will continue to follow.           Please page the ID Transplant/Liquid Oncology Fellow consult at [phone] with questions.  Patient discussed with Dr. [name].    [name], MD  [facility] Division of Infectious Diseases    History of Present Illness:      External record(s): Primary team note: NGT remains in place, but hoping to try pt on sips of clears .    Independent historian(s): no independent historian required.       Interval hx:  No events overnight. Continues with NGT. No fevers. Pt reports discomfort of abdomen that persists, however is able to tolerate intermittent sips. One large BM yesterday.      Vital Signs last 24 hours:  Temp:  [36.5 ??C (97.7 ??F)-37.3 ??C (99.1 ??F)] 36.8 ??C (98.3 ??F)  Heart Rate:  [66-79] 66  SpO2 Pulse:  [66-82] 66  Resp:  [16-23] 20  BP: (151-188)/(23-47) 155/27  MAP (mmHg):  [73-92] 74  SpO2:  [95 %-100 %] 100 %    Physical Exam:  Patient Lines/Drains/Airways Status       Active Active Lines, Drains, & Airways       Name Placement date Placement time Site Days    NG/OG Tube Decompression 16 Fr. Right nostril 10/07/22  1300  Right nostril  1    Peripheral IV 10/07/22 Anterior;Right Forearm 10/07/22  0420  Forearm  2    Arteriovenous Fistula - Vein Graft  Access 08/24/16 Left;Upper Arm 08/24/16  --  Arm  2237                    Const [x]  vital signs above    []  NAD, non-toxic appearance []  Chronically ill-appearing, non-distressed  Sitting in chair, awake/alert but slightly uncomfortable      Eyes [x]  Lids normal bilaterally, conjunctiva anicteric and noninjected OU     [] PERRL  [] EOMI        ENMT [x]  Normal appearance of external nose and ears, no nasal discharge        [x]  MMM, no lesions on lips or gums []  No thrush, leukoplakia, oral lesions  []  Dentition good []  Edentulous []  Dental caries present  []  Hearing normal  []  TMs with good light reflexes bilaterally   NGT in place, not on suction      Neck [x]  Neck of normal appearance and trachea midline        []  No thyromegaly, nodules, or tenderness   []  Full neck ROM  Supple  Lymph [x]  No LAD in neck     []  No LAD in supraclavicular area     []  No LAD in axillae   []  No LAD in epitrochlear chains     []  No LAD in inguinal areas        CV [x]  RRR            []  No peripheral edema     []  Pedal pulses intact   []  No abnormal heart sounds appreciated   []  Extremities WWP   II/VI murmur LUSB      Resp []  Normal WOB at rest    []  No breathlessness with speaking, no coughing  []  CTA anteriorly    []  CTA posteriorly    Breathing comfortably      GI [x]  Normal inspection, NTND   []  NABS     []  No umbilical hernia on exam       []  No hepatosplenomegaly     []  Inspection of perineal and perianal areas normal  Moderately distended and generally tender      GU []  Normal external genitalia     [] No urinary catheter present in urethra   []  No CVA tenderness    []  No tenderness over renal allograft        MSK []  No clubbing or cyanosis of hands       []  No vertebral point tenderness  []  No focal tenderness or abnormalities on palpation of joints in RUE, LUE, RLE, or LLE        Skin [x]  No rashes, lesions, or ulcers of visualized skin     []  Skin warm and dry to palpation   LUE prior graft in place with thrill      Neuro [x]  Face expression symmetric  []  Sensation to light touch grossly intact throughout    [x]  Moves extremities equally    []  No tremor noted        []  CNs II-XII grossly intact     []  DTRs normal and symmetric throughout []  Gait unremarkable        Psych []  Appropriate affect       []  Fluent speech         []  Attentive, good eye contact  []  Oriented to person, place, time          []  Judgment and insight are appropriate   Alert, speech normal        Data for Medical Decision Making     (3/14) EKG QTcF 438    I discussed mgm't w/qualified health care professional(s) involved in case: primary team regarding recs and micro and/or path w/lab personnel susceptibility results of E. Coli, adding fosfomycin .    I reviewed CBC results (WBC 3.8, stable), chemistry results (Cr 1.64, improving), micro result(s) (Ucx 3/15 with E. Coli, susc performed on prior), and radiology report(s) (Renal US persistent collection).    I independently visualized/interpreted not done.       Recent Labs   Lab Units 10/09/22  0601 10/07/22  0622 10/06/22  0419 10/06/22  0242 10/05/22  1110   WBC 10*9/L 3.8   < > 8.9   < > 10.6   HEMOGLOBIN g/dL 8.0*   < > 7.6*   < > 7.8*   PLATELET COUNT (1) 10*9/L 71*   < > 51*   < > 64*   NEUTRO ABS 10*9/L  --   --   --   --  10.1*  LYMPHO ABS 10*9/L  --   --   --   --  0.3*   EOSINO ABS 10*9/L  --   --   --   --  0.0   BUN mg/dL 47*   < > 45* - 45*   < > 48*   CREATININE mg/dL 1.61*   < > 0.96* - 0.45*   < > 1.79*   AST U/L  --   --  20   < > 21   ALT U/L  --   --  17   < > 19   BILIRUBIN TOTAL mg/dL  --   --  0.5   < > 0.4   ALK PHOS U/L  --   --  50   < > 43*   POTASSIUM WHOLE BLOOD   --    < > 4.9*   < >  --    POTASSIUM mmol/L 4.8   < > 4.9* - 4.9*   < > 5.4*   MAGNESIUM mg/dL 1.9   < > 1.5*   < > 1.7   PHOSPHORUS mg/dL 2.9  --   --   --  2.2*   CALCIUM mg/dL 9.0   < > 8.3* - 8.3*   < > 8.2*    < > = values in this interval not displayed.       Lab Results   Component Value Date    Tacrolimus, Trough 9.5 10/08/2022       Microbiology:  Microbiology Results (last day)       Procedure Component Value Date/Time Date/Time    MADD [[id number]] Collected: 10/05/22 1002    Lab Status: Final result Specimen: Urine from Clean Catch Updated: 10/09/22 0823     MADD Reqeust Additional request in process    Urine Culture [[id number]]  (Abnormal) Collected: 10/05/22 1002    Lab Status: Edited Result - FINAL Specimen: Urine from Clean Catch Updated: 10/09/22 0823     Urine Culture, Comprehensive 10,000 to 50,000 CFU/mL Escherichia coli     Comment: Susceptibility performed on Previous Isolate - Blood culture 10/05/22       Narrative:      Specimen Source: Clean Catch    Blood Culture #1 [[id number]]  (Normal) Collected: 10/07/22 0620    Lab Status: Preliminary result Specimen: Blood from 1 Peripheral Draw Updated: 10/09/22 0645     Blood Culture, Routine No Growth at 48 hours    Blood Culture #2 [[id number]]  (Normal) Collected: 10/07/22 0624    Lab Status: Preliminary result Specimen: Blood from 1 Peripheral Draw Updated: 10/09/22 0645     Blood Culture, Routine No Growth at 48 hours    C. Difficile Assay [id number]  (Normal) Collected: 10/08/22 1600    Lab Status: Final result Specimen: Stool  Updated: 10/09/22 0023     C. Diff Result Negative     Comment: Clostridium difficile NOT detected       Narrative:      The methodology of this test detects C. difficile toxin A and/or toxin B, by EIA.                Imaging:  US Renal Transplant Limited    Result Date: 10/08/2022  EXAM: US RENAL TRANSPLANT LIMITED ACCESSION: [id number] UN     CLINICAL INDICATION: evaluation of perinephric fluid collection     COMPARISON: Renal transplant ultrasound 10/05/2022, CT abdomen pelvis 10/07/2022     TECHNIQUE:  Static and cine images of the renal transplant  and urinary bladder were obtained.     FINDINGS: TRANSPLANTED KIDNEY: The renal transplant was located in the left lower quadrant. Normal size and echogenicity.  No solid masses or calculi. No perinephric collections identified. No hydronephrosis.      Renal transplant:   12.4  x  4.6   x 6.1 cm     Redemonstrated mildly complex perinephric fluid collection measuring 8.1 x 0.9 x 1.6 cm. New upper pole mildly complex perinephric collection with possible communication with the dominant collection. The upper portion of the collection measures 2.4 x 2.2 x 0.8 cm     BLADDER: Unremarkable.      Bladder volume: 190   mL     OTHER: Trace free fluid in the lower abdomen. Loop of dilated fluid-filled small bowel adjacent to the transplant kidney measuring up to 3.3 cm in diameter.         Complex perinephric fluid collection measuring up to 8.1 cm suggestive of persistent hematoma. A new but similar complex collection adjacent to the upper pole of the transplant kidney measures up to 2.2 cm and possibly communicates with the dominant portion of the collection.     Mildly dilated loop of fluid-filled small bowel adjacent to the transplant kidney, similar to recent CT.                     Echocardiogram Follow Up/Limited Echo    Result Date: 10/08/2022  Patient Info Name:     Julie [name] Age:     71 years DOB:     [birth year] Gender:     Female MRN:     [id number] Accession #:     [id number] UN Account #:     [id number] Ht:     147 cm Wt:     59 kg BSA:     1.57 m2 BP:     186 /     29 mmHg HR:     76 bpm Heart Rhythm:     Sinus Rhythm Exam Date:     10/08/2022 10:58 AM Admit Date:     10/05/2022     Exam Type:     ECHOCARDIOGRAM FOLLOW UP/LIMITED ECHO     Technical Quality:     Fair     Staff Sonographer:     Sedonia Small Rodgers-Jones     Study Info Indications      - worsening pulm edema \\E &E\ effusions Procedure(s)   Limited two-dimensional, color flow and Doppler transthoracic echocardiogram is performed.         Summary   1. The left ventricle is normal in size with mildly to moderately increased wall thickness.   2. The left ventricular systolic function is normal, LVEF is visually estimated at > 55%.   3. There is grade I diastolic dysfunction (impaired relaxation).   4. The aortic valve is trileaflet with mildly thickened leaflets with normal excursion.   5. The left atrium is mildly dilated in size.   6. The right ventricle is normal in size, with normal systolic function.         Left Ventricle   The left ventricle is normal in size with mildly to moderately increased wall thickness. The left ventricular systolic function is normal, LVEF is visually estimated at > 55%. There is grade I diastolic dysfunction (impaired relaxation).     Right Ventricle   The right ventricle is normal in size, with normal systolic function.  Left Atrium   The left atrium is mildly dilated in size.     Right Atrium   The right atrium is normal in size.         Aortic Valve   The aortic valve is trileaflet with mildly thickened leaflets with normal excursion. There is trivial aortic regurgitation. There is no evidence of a significant transvalvular gradient.     Mitral Valve   The mitral valve leaflets are normal with normal leaflet mobility. There is no significant mitral valve regurgitation.     Tricuspid Valve   The tricuspid valve leaflets are normal, with normal leaflet mobility. There is mild tricuspid regurgitation. There is no pulmonary hypertension.     Pulmonic Valve   The pulmonic valve is normal. There is mild pulmonic regurgitation. There is no evidence of a significant transvalvular gradient.         Aorta   The aorta is normal in size in the visualized segments.     Inferior Vena Cava   IVC size and inspiratory change suggest normal right atrial pressure. (0-5 mmHg).     Pericardium/Pleural   There is no pericardial effusion.         Ventricles ---------------------------------------------------------------------- Name                                 Value        Normal ----------------------------------------------------------------------     LV Dimensions 2D/MM ----------------------------------------------------------------------  IVS Diastolic Thickness (2D)                                1.3 cm       0.6-0.9 LVID Diastole (2D)                  2.8 cm       3.8-5.2  LVPW Diastolic Thickness (2D) 1.3 cm       0.6-0.9 LVID Systole (2D)                   1.9 cm       2.2-3.5     Atria ---------------------------------------------------------------------- Name                                 Value        Normal ----------------------------------------------------------------------     LA Dimensions ---------------------------------------------------------------------- LA Dimension (2D)                   4.5 cm       2.7-3.8     Aortic Valve ---------------------------------------------------------------------- Name                                 Value        Normal ----------------------------------------------------------------------     AV Doppler ---------------------------------------------------------------------- AV Peak Velocity                   1.9 m/s               AV Peak Gradient                   15 mmHg     Mitral Valve ---------------------------------------------------------------------- Name  Value        Normal ----------------------------------------------------------------------     MV Diastolic Function ---------------------------------------------------------------------- MV E Peak Velocity                 51 cm/s               MV A Peak Velocity                 90 cm/s               MV E/A                                 0.6                   MV Annular TDI ---------------------------------------------------------------------- MV Septal e' Velocity             4.0 cm/s         >=8.0 MV E/e' (Septal)                      12.6               MV Lateral e' Velocity            5.4 cm/s        >=10.0 MV E/e' (Lateral)                      9.3               MV e' Average                     4.7 cm/s               MV E/e' (Average)                     11.0     Tricuspid Valve ---------------------------------------------------------------------- Name                                 Value        Normal ----------------------------------------------------------------------     Estimated PAP/RSVP ---------------------------------------------------------------------- RA Pressure                         3 mmHg           <=5     Aorta ---------------------------------------------------------------------- Name                                 Value        Normal ----------------------------------------------------------------------     Ascending Aorta ---------------------------------------------------------------------- Ao Root Diameter (2D)               2.9 cm               Ao Root Diam Index (2D)          1.8 cm/m2         Report Signatures Finalized by Carin Hock  MD on 10/08/2022 03:59 PM    CT Abdomen Pelvis Wo Contrast    Result Date: 10/08/2022  EXAM: CT ABDOMEN PELVIS WO CONTRAST ACCESSION: [id number] UN CLINICAL INDICATION: 72 years old with Eval for obstruction  COMPARISON: CT abdomen pelvis 10/05/2022     TECHNIQUE: A spiral CT scan was obtained without IV contrast from the lung bases to the pubic symphysis.  Images were reconstructed in the axial plane. Coronal and sagittal reformatted images were also provided for further evaluation.     Evaluation of the solid organs and vasculature is limited in the absence of intravenous contrast.         FINDINGS:     LOWER CHEST: Trace right pleural effusion. Bibasilar atelectasis. Aortic valve and coronary artery calcifications.     LIVER: Normal liver contour. 1.1 cm posterior right hepatic lesion, most likely a benign cyst or biliary hamartoma (2:32). No suspicious focal liver lesion on non-contrast examination.     BILIARY: The gallbladder is normal in appearance. No intrahepatic biliary ductal dilatation.     SPLEEN: Unchanged posterior splenic cyst.     PANCREAS: Normal pancreatic contour without signs of inflammation or gross ductal dilatation.     ADRENAL GLANDS: Normal appearance of the adrenal glands.     KIDNEYS/URETERS: Atrophy of the bilateral native kidneys. Left lower quadrant transplant kidney. Similar appearance of mild enlargement of the transplant kidney with perinephric stranding/inflammation. Bilateral nonobstructing nephrolithiasis of the native kidneys. No ureteral dilatation or collecting system distention.     BLADDER: Unremarkable.     REPRODUCTIVE ORGANS: Uterus and ovaries are surgically absent.     GI TRACT: Enteric tube in place which is coiled in the stomach with tip pointing towards the gastric fundus. There are multiple dilated fluid-filled loops of small bowel throughout the abdomen measuring up to 3.7 cm. There are multiple points of bowel caliber change centered in an area in the the right lower quadrant with localized mesenteric stranding/vascular congestion and free fluid (2:91). Evidence of bowel ischemia cannot be evaluated in the absence of intravenous contrast. Colon diverticulosis without evidence of diverticulitis. Normal appendix.     PERITONEUM/RETROPERITONEUM AND MESENTERY: No free air. Small volume abdominopelvic ascites, increased from prior exam. Mesenteric edema/stranding localized in the right lower quadrant. No organized fluid collection.     VASCULATURE: Normal caliber aorta. Otherwise, limited evaluation without contrast.     LYMPH NODES: No adenopathy.     BONES and SOFT TISSUES: No aggressive osseous lesions. Body wall edema.         Concern for developing high-grade small bowel obstruction due to adhesions.     Presence of bowel ischemia cannot be evaluated in the absence of intravenous contrast.     Consider surgical consultation.         XR Abdomen 1 View    Result Date: 10/07/2022  EXAM: XR ABDOMEN 1 VIEW ACCESSION: [id number] UN     CLINICAL INDICATION: 72 years old with INTESTINAL OBSTRUCTION      COMPARISON: 10/07/2022     TECHNIQUE: Supine view of the abdomen, 2 images(s)     FINDINGS: Slight increase in small bowel dilation, now 4.4 cm. Bowel gas is noted in a colonic loop in the midabdomen, presumably redundant sigmoid colon. Multiple surgical clips are noted in the left lower quadrant.         Increasing small bowel dilatation with gas filled loop of sigmoid colon. Findings suggest ileus related to left lower quadrant transplant. If abdominal pain persists or worsens, consider CT abdomen pelvis.         XR Abdomen 1 View    Result Date: 10/07/2022  EXAM: XR ABDOMEN 1 VIEW ACCESSION: [id number] UN     CLINICAL INDICATION: 71 years  old with NGT (CATHETER NON VASC FIT & ADJ)      COMPARISON: Abdominal radiograph 10/07/2022     TECHNIQUE: Supine view of the abdomen, 1 images(s)     FINDINGS: Nonweighted enteric tube with redundant tubing coiled within the stomach, the tip and sideport overlie the left upper quadrant. Similar gaseous distention of the small bowel in the upper abdomen measuring up to 3.8 cm. Gas is also seen within nondistended colon. See same-day chest radiograph for evaluation of structures above the diaphragm.         --Nonweighted enteric tube with redundant tubing coiled within the stomach, the tip and sideport overlie the left upper quadrant, likely at the gastric fundus.     --Similar gas-filled mildly dilated loops of small bowel within the upper abdomen. Gas is also seen within nondistended large bowel. Findings can be seen in the setting of ileus.         XR Chest Portable    Result Date: 10/07/2022  EXAM: XR CHEST PORTABLE ACCESSION: [id number] UN     CLINICAL INDICATION: Dyspnea.     TECHNIQUE: Single View AP Chest Radiograph.     COMPARISON: Previous day 2:43.     FINDINGS:     No new consolidation, pleural fluid, or pneumothorax. Stable cardiomediastinal contour with calcified aorta. Low lung volumes. Vascular stent on the left. Equivocal mild interstitial edema accentuated by low volumes.                 Possible mild interstitial edema accentuated by low lung volumes.    XR Abdomen 1 View    Result Date: 10/07/2022  EXAM: XR ABDOMEN 1 VIEW ACCESSION: [id number] UN     CLINICAL INDICATION: 72 years old with ABDOMINAL PAIN  -  UNSPECIFIED SITE      COMPARISON: 10/05/2022     TECHNIQUE: Supine view of the abdomen, 2 images(s)     FINDINGS: Increasing dilatation of small bowel loops now measuring up to 4.2 cm. Gas distended stomach. Gas is noted within the colon. Small colonic stool burden. Surgical clips overlie the left lower quadrant.         Increasing small bowel dilatation up to 4.2 cm with relative decompression of the colon. This could be due to ileus versus bowel obstruction. If abdominal pain persists or worsens, consider CT abdomen pelvis.

## 2022-10-10 DIAGNOSIS — Z940 Kidney transplant status: Principal | ICD-10-CM

## 2022-10-10 LAB — RENAL FUNCTION PANEL
ALBUMIN: 2.6 g/dL — ABNORMAL LOW (ref 3.4–5.0)
ANION GAP: 10 mmol/L (ref 5–14)
BLOOD UREA NITROGEN: 41 mg/dL — ABNORMAL HIGH (ref 9–23)
BUN / CREAT RATIO: 32
CALCIUM: 7.7 mg/dL — ABNORMAL LOW (ref 8.7–10.4)
CHLORIDE: 92 mmol/L — ABNORMAL LOW (ref 98–107)
CO2: 18 mmol/L — ABNORMAL LOW (ref 20.0–31.0)
CREATININE: 1.28 mg/dL — ABNORMAL HIGH
EGFR CKD-EPI (2021) FEMALE: 44 mL/min/{1.73_m2} — ABNORMAL LOW (ref >=60)
GLUCOSE RANDOM: 668 mg/dL (ref 70–179)
PHOSPHORUS: 2.5 mg/dL (ref 2.4–5.1)
POTASSIUM: 4.4 mmol/L (ref 3.4–4.8)
SODIUM: 120 mmol/L — ABNORMAL LOW (ref 135–145)

## 2022-10-10 LAB — CBC
HEMATOCRIT: 21.1 % — ABNORMAL LOW (ref 34.0–44.0)
HEMOGLOBIN: 6.9 g/dL — ABNORMAL LOW (ref 11.3–14.9)
MEAN CORPUSCULAR HEMOGLOBIN CONC: 32.6 g/dL (ref 32.0–36.0)
MEAN CORPUSCULAR HEMOGLOBIN: 31.9 pg (ref 25.9–32.4)
MEAN CORPUSCULAR VOLUME: 97.9 fL — ABNORMAL HIGH (ref 77.6–95.7)
MEAN PLATELET VOLUME: 8.7 fL (ref 6.8–10.7)
PLATELET COUNT: 71 10*9/L — ABNORMAL LOW (ref 150–450)
RED BLOOD CELL COUNT: 2.16 10*12/L — ABNORMAL LOW (ref 3.95–5.13)
RED CELL DISTRIBUTION WIDTH: 15.9 % — ABNORMAL HIGH (ref 12.2–15.2)
WBC ADJUSTED: 2.8 10*9/L — ABNORMAL LOW (ref 3.6–11.2)

## 2022-10-10 LAB — LACTATE, VENOUS, WHOLE BLOOD: LACTATE BLOOD VENOUS: 0.6 mmol/L (ref 0.5–1.8)

## 2022-10-10 LAB — TSH: THYROID STIMULATING HORMONE: 1.125 u[IU]/mL (ref 0.550–4.780)

## 2022-10-10 LAB — TACROLIMUS LEVEL, TROUGH: TACROLIMUS, TROUGH: 11.5 ng/mL (ref 5.0–15.0)

## 2022-10-10 LAB — MAGNESIUM: MAGNESIUM: 1.7 mg/dL (ref 1.6–2.6)

## 2022-10-10 MED ADMIN — sodium chloride (NS) 0.9 % infusion: 75 mL/h | INTRAVENOUS | @ 17:00:00 | Stop: 2022-10-11

## 2022-10-10 MED ADMIN — heparin (porcine) 5,000 unit/mL injection 5,000 Units: 5000 [IU] | SUBCUTANEOUS | @ 01:00:00

## 2022-10-10 MED ADMIN — hydrALAZINE (APRESOLINE) tablet 25 mg: 25 mg | ORAL | @ 17:00:00

## 2022-10-10 MED ADMIN — cloNIDine HCL (CATAPRES) tablet 0.1 mg: .1 mg | ORAL | @ 01:00:00

## 2022-10-10 MED ADMIN — fentaNYL (PF) (SUBLIMAZE) injection: INTRAVENOUS | @ 15:00:00 | Stop: 2022-10-10

## 2022-10-10 MED ADMIN — melatonin tablet 3 mg: 3 mg | ORAL | @ 02:00:00

## 2022-10-10 MED ADMIN — methylPREDNISolone sodium succinate (PF) (SOLU-Medrol) injection 5.2 mg: 5 mg | INTRAVENOUS | @ 10:00:00

## 2022-10-10 MED ADMIN — pantoprazole (Protonix) EC tablet 40 mg: 40 mg | ORAL | @ 12:00:00

## 2022-10-10 MED ADMIN — HYDROmorphone (PF) injection Syrg 0.25 mg: .25 mg | INTRAVENOUS | @ 19:00:00 | Stop: 2022-10-21

## 2022-10-10 MED ADMIN — magnesium oxide-Mg AA chelate (Magnesium Plus Protein) 2 tablet: 2 | ORAL | @ 12:00:00

## 2022-10-10 MED ADMIN — micafungin (MYCAMINE) 150 mg in sodium chloride (NS) 0.9 % 100 mL IVPB: 150 mg | INTRAVENOUS | @ 21:00:00 | Stop: 2022-10-17

## 2022-10-10 MED ADMIN — tacrolimus (PROGRAF) 1.5mg combo product: 1.5 mg | SUBLINGUAL | @ 12:00:00

## 2022-10-10 MED ADMIN — atorvastatin (LIPITOR) tablet 10 mg: 10 mg | ORAL | @ 12:00:00

## 2022-10-10 MED ADMIN — labetalol (NORMODYNE) injection: 20 mg | INTRAVENOUS | @ 08:00:00

## 2022-10-10 MED ADMIN — insulin lispro (HumaLOG) injection 0-20 Units: 0-20 [IU] | SUBCUTANEOUS | @ 21:00:00

## 2022-10-10 MED ADMIN — heparin (porcine) 5,000 unit/mL injection 5,000 Units: 5000 [IU] | SUBCUTANEOUS | @ 17:00:00

## 2022-10-10 MED ADMIN — ondansetron (ZOFRAN) injection 4 mg: 4 mg | INTRAVENOUS | @ 19:00:00

## 2022-10-10 MED ADMIN — amlodipine (NORVASC) tablet 5 mg: 5 mg | ORAL | @ 12:00:00

## 2022-10-10 MED ADMIN — labetalol (NORMODYNE) injection: 20 mg | INTRAVENOUS | @ 01:00:00

## 2022-10-10 MED ADMIN — levothyroxine (SYNTHROID) tablet 112 mcg: 112 ug | ORAL | @ 10:00:00

## 2022-10-10 MED ADMIN — tacrolimus (PROGRAF) capsule 1 mg: 1 mg | SUBLINGUAL | @ 01:00:00

## 2022-10-10 MED ADMIN — phenol (CHLORASEPTIC) 1.4 % spray 2 spray: 2 | @ 02:00:00

## 2022-10-10 MED ADMIN — magnesium oxide-Mg AA chelate (Magnesium Plus Protein) 2 tablet: 2 | ORAL | @ 01:00:00

## 2022-10-10 MED ADMIN — cloNIDine HCL (CATAPRES) tablet 0.1 mg: .1 mg | ORAL | @ 12:00:00 | Stop: 2022-10-10

## 2022-10-10 MED ADMIN — mycophenolate (CELLCEPT) 500 mg in dextrose 5 % 100 mL IVPB: 500 mg | INTRAVENOUS | @ 01:00:00

## 2022-10-10 MED ADMIN — amlodipine (NORVASC) tablet 5 mg: 5 mg | ORAL | @ 01:00:00

## 2022-10-10 MED ADMIN — acetaminophen (OFIRMEV) 10 mg/mL injection 1,000 mg: 1000 mg | INTRAVENOUS | @ 17:00:00 | Stop: 2022-10-11

## 2022-10-10 MED ADMIN — diatrizoate meglumine-sodium (GASTROGRAFIN) 66-10 % solution (37% organically bound iodine) 120 mL: 120 mL | ORAL | @ 22:00:00 | Stop: 2022-10-10

## 2022-10-10 MED ADMIN — mycophenolate (CELLCEPT) 500 mg in dextrose 5 % 100 mL IVPB: 500 mg | INTRAVENOUS | @ 12:00:00

## 2022-10-10 MED ADMIN — ondansetron (ZOFRAN-ODT) disintegrating tablet 4 mg: 4 mg | ORAL | @ 23:00:00

## 2022-10-10 MED ADMIN — heparin (porcine) 5,000 unit/mL injection 5,000 Units: 5000 [IU] | SUBCUTANEOUS | @ 10:00:00

## 2022-10-10 MED ADMIN — HYDROmorphone (PF) injection Syrg 0.25 mg: .25 mg | INTRAVENOUS | @ 10:00:00 | Stop: 2022-10-21

## 2022-10-10 MED ADMIN — meropenem (MERREM) 1 g in sodium chloride 0.9 % (NS) 100 mL IVPB-MBP: 1 g | INTRAVENOUS | @ 12:00:00 | Stop: 2022-10-15

## 2022-10-10 MED ADMIN — lidocaine (XYLOCAINE) 5 % ointment: TOPICAL | @ 21:00:00 | Stop: 2022-10-10

## 2022-10-10 MED ADMIN — carvedilol (COREG) tablet 25 mg: 25 mg | ORAL | @ 12:00:00

## 2022-10-10 MED ADMIN — carvedilol (COREG) tablet 25 mg: 25 mg | ORAL | @ 01:00:00

## 2022-10-10 MED ADMIN — midazolam (VERSED) injection: INTRAVENOUS | @ 15:00:00 | Stop: 2022-10-10

## 2022-10-10 MED ADMIN — meropenem (MERREM) 1 g in sodium chloride 0.9 % (NS) 100 mL IVPB-MBP: 1 g | INTRAVENOUS | @ 01:00:00 | Stop: 2022-10-11

## 2022-10-10 NOTE — Unmapped (Signed)
Tacrolimus Therapeutic Monitoring Pharmacy Note    Julie [name] is a 72 y.o. female continuing tacrolimus.     Indication: Kidney transplant     Date of Transplant: 08/06/2022     Prior Dosing Information: Current regimen 1.5 mg sublingual in AM and 1 mg sublingual in PM   Home regimen: PO Prograf 2 mg BID    Source(s) of information used to determine prior to admission dosing: Home Medication List or Clinic Note     Goals:  Therapeutic Drug Levels  Tacrolimus trough goal: 7-9 ng/mL - goal confirmed with transplant nephrology attending 3/19    Additional Clinical Monitoring/Outcomes  Monitor renal function (SCr and urine output) and liver function (LFTs)  Monitor for signs/symptoms of adverse events (e.g., hyperglycemia, hyperkalemia, hypomagnesemia, hypertension, headache, tremor)    Results:   Tacrolimus level: 11.5 ng/mL, drawn ~1 hour after AM dose administered - more representative of a peak level rather than trough    Pharmacokinetic Considerations and Significant Drug Interactions:  Concurrent hepatotoxic medications: None identified  Concurrent CYP3A4 substrates/inhibitors: None identified  Concurrent nephrotoxic medications:  None identified    Assessment/Plan:  Recommendedation(s)  After discussion with nephrology, favor not rechecking a PM level and follow repeat level tomorrow AM.  Continue current regimen of 1.5 mg SL in AM and 1 mg SL in PM.  Tacrolimus administration is ordered for 0800/2000. Tacrolimus level ordered for 0730 tomorrow.     Follow-up  Daily levels ordered x3 days.  A pharmacist will continue to monitor and recommend levels as appropriate    Please page service pharmacist with questions/clarifications.    Julie [name] , PharmD Candidate    Julie [name], PharmD, BCPS  Acute Care Clinical Pharmacist

## 2022-10-10 NOTE — Unmapped (Signed)
Duplicate. Completed on 3/15

## 2022-10-10 NOTE — Unmapped (Addendum)
Julie [name] is a 72 y.o. female with complicated previous medical history status post DCD DDKT in 07/2022, hypertension, type 2 diabetes, hypothyroidism, who was admitted for GNR bacteremia and was treated with meropenem until 4/1. She is s/p exploratory laparotomy and reduction and small bowel volvulus on 10/13/22. She was transferred to nephrology service for titration of tacrolimus in setting of supra therapeutic level. Discharged with home health on 4/3.     ESBL/FQ-R E. coli bacteremia (resolved)  Presented with sepsis, blood and urine cultures grew MDR E Coli. She is s/p DDKT in Jan 2024. Imaging showed perinephric collection new from prior (she had prior JP drain). Underwent IR guided fluid collection. Finished course of meropenem 3/15-4/1 via non-tunneled RIJ line. Followed by [facility] while inpatient.   - Referred to urogyn for recurrent UTI    SBO s/p ex-lap  She is s/p exploratory laparotomy and reduction and small bowel volvulus on 10/13/22. NGT removed 3/24, having BM and normal PO.    S/p DDKT 08/06/22   Follows with Dr. Elvera Maria. 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 DCD, KDPI 56%, Basiliximab induction, CMV +/+, EBV +/+, standard anatomy. Post kidney transplant course has been notable for: E coli UTI.  Renal US 3/14 showed small new perinephric collection which may represent hematoma, complicated seroma, or abscess. Stable resistive indices in the renal transplant arteries, which are mildly elevated. Tac lvl previously subtherapeutic, now in appropriate range. Meds: Tacrolimus 1mg  AM + 0.5mg  PM, Cellcept 500mg  BID, Pred 5 daily, Prophylaxis: bactrim and valcyte daily. Cont daily aspirin and atorvastatin.      HTN  Hypertensive while inpatient with systolics 140s-150s on discharge. Was on clonidine 0.1 mg qPM during her hospital stay, discontinued on 3/25. Discontinued spiro in setting of hyperkalemai. Discharged on coreg 25mg  bid, amlo 5mg  bid, hydral 25mg  TID, chlorthalidone 25mg  daily.     Hyperkalemia  Peaked to 6. Improved to 5.3 day of discharge with lokelma and restart chlorthalidone.   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 Did receive unit PRBC on 3.19. Stable at discharge.      T2DM  Last A1C 5.6. Home regimen was Lantus 12 units nightly, lispro 3 units with meals, with sliding scale.  Lower needs at discharge, sent home on lantus 6u nightly without sliding scale (was only requiring 1-2u daily and glucoses have downtrended with treating the infection).      C/f thrush, candida esophagitis  Has had a sore throat and pain with swallowing since NGT insertion. Has white coating on tongue. Is at risk for candida given immunosuppression. IV micafungin 3/19 and transitioned to fluconazole on 3/26-4/3.     APS report filed and pending  Per social work and transplant team. Daughters initiating guardianship application. APS report is pending. Case will be continued to be followed with transplant case manager Lowella Petties. DC to home w/ spouse; resume HH; [facility] [employer] remain involved and will complete their assessment post dc.      Chronic Problems  Hypothyroidism: Continue Synthroid. Repeat TSH wnl.   HLD- atorva 10 daily  Pain control- prn methocarbamol  GERD- PPI

## 2022-10-10 NOTE — Unmapped (Signed)
Pt Julie [name]/Ox4, O2 stable on 1L [place], NSR. DBP in 20s, MD notified. NGT to low intermittent suction, output per MAR, sips of clear liquids, abdomen rounded and hard. Pt complained of pain, PRN tylenol given per MAR. Small loose BM, low urine output. Pt ambulated with contact guard assist. Bed bath, linen change given. Family at bedside.     Problem: Adult Inpatient Plan of Care  Goal: Plan of Care Review  Outcome: Ongoing - Unchanged  Goal: Patient-Specific Goal (Individualized)  Outcome: Ongoing - Unchanged  Goal: Absence of Hospital-Acquired Illness or Injury  Outcome: Ongoing - Unchanged  Intervention: Identify and Manage Fall Risk  Recent Flowsheet Documentation  Taken 10/09/2022 0800 by Joseph Art, RN  Safety Interventions:   aspiration precautions   bleeding precautions   enteral feeding safety   fall reduction program maintained   infection management   isolation precautions   lighting adjusted for tasks/safety   low bed   nonskid shoes/slippers when out of bed  Intervention: Prevent Skin Injury  Recent Flowsheet Documentation  Taken 10/09/2022 1800 by Joseph Art, RN  Positioning for Skin: Supine/Back  Taken 10/09/2022 1600 by Joseph Art, RN  Positioning for Skin: Sitting in Chair  Taken 10/09/2022 1400 by Joseph Art, RN  Positioning for Skin: Sitting in Chair  Taken 10/09/2022 1200 by Joseph Art, RN  Positioning for Skin: Sitting in Chair  Device Skin Pressure Protection: absorbent pad utilized/changed  Skin Protection:   adhesive use limited   incontinence pads utilized  Taken 10/09/2022 1000 by Joseph Art, RN  Positioning for Skin: Sitting in Chair  Taken 10/09/2022 0800 by Joseph Art, RN  Positioning for Skin: Sitting in Chair  Intervention: Prevent and Manage VTE (Venous Thromboembolism) Risk  Recent Flowsheet Documentation  Taken 10/09/2022 1800 by Joseph Art, RN  Anti-Embolism Device Type: SCD, Knee  Anti-Embolism Intervention: Off  Anti-Embolism Device Location: BLE  Taken 10/09/2022 1600 by Joseph Art, RN  Anti-Embolism Device Type: SCD, Knee  Anti-Embolism Intervention: Off  Anti-Embolism Device Location: BLE  Taken 10/09/2022 1400 by Joseph Art, RN  Anti-Embolism Device Type: SCD, Knee  Anti-Embolism Intervention: Off  Anti-Embolism Device Location: BLE  Taken 10/09/2022 1200 by Joseph Art, RN  VTE Prevention/Management:   ambulation promoted   anticoagulant therapy   bleeding precautions maintained   intravenous hydration  Anti-Embolism Device Type: SCD, Knee  Anti-Embolism Intervention: Off  Anti-Embolism Device Location: BLE  Taken 10/09/2022 1000 by Joseph Art, RN  Anti-Embolism Device Type: SCD, Knee  Anti-Embolism Intervention: Off  Anti-Embolism Device Location: BLE  Taken 10/09/2022 0800 by Joseph Art, RN  Anti-Embolism Device Type: SCD, Knee  Anti-Embolism Intervention: Off  Anti-Embolism Device Location: BLE  Intervention: Prevent Infection  Recent Flowsheet Documentation  Taken 10/09/2022 0800 by Joseph Art, RN  Infection Prevention:   equipment surfaces disinfected   hand hygiene promoted   personal protective equipment utilized   rest/sleep promoted   single patient room provided  Goal: Optimal Comfort and Wellbeing  Outcome: Ongoing - Unchanged  Goal: Readiness for Transition of Care  Outcome: Ongoing - Unchanged  Goal: Rounds/Family Conference  Outcome: Ongoing - Unchanged     Problem: Infection  Goal: Absence of Infection Signs and Symptoms  Outcome: Ongoing - Unchanged  Intervention: Prevent or Manage Infection  Recent Flowsheet Documentation  Taken 10/09/2022 0800 by Joseph Art, RN  Infection Management: aseptic technique maintained  Isolation Precautions: contact precautions maintained     Problem: Fall Injury Risk  Goal: Absence of Fall and Fall-Related Injury  Outcome: Ongoing - Unchanged  Intervention: Promote Injury-Free Environment  Recent Flowsheet Documentation  Taken 10/09/2022 0800 by Joseph Art, RN  Safety Interventions:   aspiration precautions   bleeding precautions   enteral feeding safety   fall reduction program maintained   infection management   isolation precautions   lighting adjusted for tasks/safety   low bed   nonskid shoes/slippers when out of bed     Problem: Self-Care Deficit  Goal: Improved Ability to Complete Activities of Daily Living  Outcome: Ongoing - Unchanged     Problem: Comorbidity Management  Goal: Maintenance of Asthma Control  Outcome: Ongoing - Unchanged  Goal: Maintenance of Behavioral Health Symptom Control  Outcome: Ongoing - Unchanged  Goal: Maintenance of COPD Symptom Control  Outcome: Ongoing - Unchanged  Goal: Blood Glucose Levels Within Targeted Range  Outcome: Ongoing - Unchanged  Goal: Maintenance of Heart Failure Symptom Control  Outcome: Ongoing - Unchanged  Goal: Blood Pressure in Desired Range  Outcome: Ongoing - Unchanged  Goal: Maintenance of Osteoarthritis Symptom Control  Outcome: Ongoing - Unchanged  Intervention: Maintain Osteoarthritis Symptom Control  Recent Flowsheet Documentation  Taken 10/09/2022 1800 by Joseph Art, RN  Activity Management: back to bed  Taken 10/09/2022 1600 by Joseph Art, RN  Activity Management: up in chair  Taken 10/09/2022 1400 by Joseph Art, RN  Activity Management: up to bedside commode  Taken 10/09/2022 1200 by Joseph Art, RN  Activity Management: up in chair  Taken 10/09/2022 1000 by Joseph Art, RN  Activity Management: up in chair  Taken 10/09/2022 0800 by Joseph Art, RN  Activity Management: up in chair  Goal: Bariatric Home Regimen Maintained  Outcome: Ongoing - Unchanged  Goal: Maintenance of Seizure Control  Outcome: Ongoing - Unchanged     Problem: Skin Injury Risk Increased  Goal: Skin Health and Integrity  Outcome: Ongoing - Unchanged  Intervention: Optimize Skin Protection  Recent Flowsheet Documentation  Taken 10/09/2022 1800 by Joseph Art, RN  Activity Management: back to bed  Taken 10/09/2022 1600 by Joseph Art, RN  Activity Management: up in chair  Taken 10/09/2022 1400 by Joseph Art, RN  Activity Management: up to bedside commode  Taken 10/09/2022 1200 by Joseph Art, RN  Activity Management: up in chair  Pressure Reduction Techniques: frequent weight shift encouraged  Pressure Reduction Devices: pressure-redistributing mattress utilized  Skin Protection:   adhesive use limited   incontinence pads utilized  Taken 10/09/2022 1000 by Joseph Art, RN  Activity Management: up in chair  Taken 10/09/2022 0800 by Joseph Art, RN  Activity Management: up in chair

## 2022-10-10 NOTE — Unmapped (Signed)
VENOUS ACCESS TEAM PROCEDURE    Nurse request was placed for a PIV by Venous Access Team (VAT).  Patient was assessed at bedside for placement of a PIV. Np at bedside which reported to vatrn that she would change  the potassium to PO.Existing piv working without complication per np.    Workup / Procedure Time:  15 minutes    RN was notified.       Thank you,     Laurann Montana, RN Venous Access Team

## 2022-10-10 NOTE — Unmapped (Signed)
Internal Medicine (MEDW) Progress Note    Assessment & Plan:   Julie [name] is a 72 y.o. female whose presentation is complicated by DDKT, HTN, hypothyroidism, T2DM that presented to [facility] with fevers, nausea and vomiting, diarrhea found to have E. Coli bacteremia. Course c/b SBO, fluid overload.     Active Problems  E. Coli bacteremia, bacteruria iso immunocompromised status - Perinephric fluid collection - Fever, N/V/D  Patient presenting with 48-hour history of fevers, febrile in ED 100.4, 24-hour history of nausea/vomiting and diarrhea.  No leukocytosis on labs, though WBC is elevated from baseline (10.6 3/14 usually ~5-7) patient does have left shift with neutrophils of 9.7.  Unclear etiology most likely gastroenteritis given acute nausea vomiting diarrhea, could also be secondary to gram-negative bacteremia.  Lower suspicion of respiratory, urinary source given patient is asymptomatic. CMV, EMV (-). Bcx, Ucx (+) E.coli. UA 3/14 positive for leukocyte esterase and WBC. CTAP 3/14 showed perinephric collection consistent with previous renal US and c/f renal infection, possibly pyelonephritis was not definitive given lack of contrast. VIR consulted for drainage of perinephric fluid collection for culture and possible source control, likely 3/18. Repeat ultrasound TXP kidney shows 8.1cm hematoma, possibly communicating 2.2cm complex collection on upper pole of kidney. Will pursue aspiration by VIR for culture.   -ID consulted, appreciate assistance   -C diff (-), GIPP needs to be collected  -Continue meropenem  -3/16 Bcx NGTD    SBO   Evening 3/15 new onset abd pain, distention. Exam (-) peritonitis, (+) BS. KUB shows increasing small bowel dilatation up to 4.2 cm with relative decompression of the colon. CTAP 3/14 showed no evidence of SBO or inflammation of GI tract. Repeat CXR 3/16 suggests ileus related to left lower quadrant transplant. CTAP 3/16 shows concern for developing high grade small bowel obstruction d/t adhesions. Unable to assess for ischemia given lack of IV contrast. General surgery consulted, placed NGT with LIWS. Patient is NPO. No acute surgical intervention needed at this time. Serial lactates have been wnl. Continues to have abdominal pain, distention, high NGT output, will do gastrografin challenge today 3/19 per surgery.   - IV dilaudid 0.25mg  q4 PRN  - IV tylenol 1g q6 PRN  - NGT to LIWS  - PRN simethicone      DDKT in 2024 - SOB, volume overload  DCD, KDPI 56%, Basiliximab induction, CMV +/+, EBV +/+, standard anatomy. Post kidney transplant course has been notable for: E coli UTI.  Renal US 3/14 showed small new perinephric collection which may represent hematoma, complicated seroma, or abscess. Stable resistive indices in the renal transplant arteries, which are mildly elevated. Course c/b rapid response x2 for SOB, found to pulmonary edema and atelectasis. Has wide pulse pressure and refractory HTN controlled w/ several medications. Sensitive to volume shifts likely due to graft, possibly aortic stenosis, and current bacteremia.   -S/p IV lasix 80mg  x2  -Would gently diurese if desats  - Continue 75cc/hr NS  -Continue bactrim for PJP prophylaxis  -Continue valganciclovir for CMV prophylaxis  -Continue sublingual tacrolimus, PTD  -Continue IV cellcept 250mg  BID  -Continue IV methylprednisolone 5mg  daily  - Bladder US pending  - Immunosuppression/prophylaxis stated above  -Transplant medicine and nephrology consulted, appreciate assistance    HTN  Systolics ranging from 150-200s. At home, HTN 160-170 with home medications. Reassuring in a sense that pt is not hypotensive from bacteremia. Working w/ txp nephrology in titrating medications.   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 Hgb 7.6 3/5 from 8.2 at admission. No bleeding noted. Platelets now 51 from 51 3/14. CTAP 3/14 showed hyperattenuation of the left ventricular myocardium suggestive of anemia. Iron panel 3/16 showed Fe 34, TIBC 173, and Fe sat of 20. Will defer iron repletion at this time. Hb 6.9 3/19, however likely spurious. Nevertheless, T&S sent, will prepare 1u pRBC.   -Daily CBC    T2DM  Last A1C 5.6.  Home regimen Lantus 12 units nightly, lispro 3 units with meals, with sliding scale.  Will decrease insulin regimen given poor p.o. intake. Glucose 668 on 3/19, likely spurious iso lab drawn while dextrose containing fluids were running.  -Lantus 6 units nightly  -SSI  -Hold mealtime insulin    C/f thrush, candida esophagitis  Has had a sore throat and pain with swallowing since NGT insertion. Has white coating on tongue. Is at risk for candida given immunosuppression. ID plans to empirically cover, awaiting recs.   - PRN chloraseptic spray  - PRN ice chips    Chronic Problems  Hypothyroidism: Continue Synthroid. Repeat TSH wnl.       The patient's presentation is complicated by the following clinically significant conditions requiring additional evaluation and treatment: - Thrombocytopenia POA requiring further investigation or monitor  - Chronic kidney disease POA requiring further investigation, treatment, or monitoring   - Hypokalemia POA requiring further investigation, treatment, or monitoring  - Acidosis POA requiring further investigation, treatment, or monitoring     Issues Impacting Complexity of Management:  -High risk of complications from pain and/or analgesia likely to result in delirium  -The patient is at high risk for the development of complications of volume overload due to the need to provide IV hydration for suspected hypovolemia in the setting of: kidney transplant    Checklist:  Diet: NPO  DVT PPx: Heparin 5000units q8h  Code Status: Full Code  Dispo: Stepdown status    Team Contact Information:   Primary Team: Internal Medicine (MEDW)  Primary Resident: Quentin Cornwall, MD  Resident's Pager: [phone] (Gen MedW Intern - Alvester Morin)    Interval History:   Still has abdominal pain, distention. Had small volume BM yesterday. Has a sore throat. Has pain with swallowing. Feels slightly out of breath.     Objective:   Temp:  [36.4 ??C (97.5 ??F)-36.9 ??C (98.4 ??F)] 36.6 ??C (97.9 ??F)  Heart Rate:  [65-68] 65  SpO2 Pulse:  [65] 65  Resp:  [16-21] 16  BP: (143-206)/(24-40) 163/40  SpO2:  [93 %-100 %] 100 %    Gen: sitting up in chair, mild distress  HENT: atraumatic, normocephalic, EOM grossly intact  Heart: RRR, systolic murmur  Lungs: bilateral crackles at lung bases, improved from prior, mildly tachypneic  Abdomen: soft, distended, (+) TTP, (+) BS  Extremities: No edema, wwp  Neuro: alert, oriented, no focal deficits appreciated

## 2022-10-10 NOTE — Unmapped (Cosign Needed)
IMMUNOCOMPROMISED HOST INFECTIOUS DISEASE CONSULT NOTE    Julie [name] is being seen in consultation at the request of Johnsie Cancel, MD for evaluation of UTI in transplant patient.    Assessment/Recommendations:    Julie [name] is a 72 y.o. 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 Had perinephric drain in place postoperatively, removed 08/30/22  - Immunosuppression: Tacrolimus (goal 7-9), MM 500F, Pred 5  - Prophylaxis: valganciclovir x 6 months (mod risk), TMP+SMX x 6 months     Pertinent comorbidities:  ESRD was on PD and HD  DM II (A1c 5.6 on 08/06/22)  CAD  HLD  SBO 10/07/22, managing with supportive care     Pertinent exposures:  Originally from [place]   Treated with ivermectin at the ID clinic in 2018.     Summary of pertinent prior infections:  #History of Shingles 2017  #04/2018 Dialysis fluid Serratia R: Ampicillin, Unasyn, Cefazolin,S: Ceftriaxone, Gentamicin, Levo, PipTazo, Tobra  #Donor urine cx with C. Dubliensis 08/05/22, <10K, negative recipient samples, negative donor blood cx, treated with 2 weeks PO fluconazole  #E. Coli bacteremia and UTI 08/21/22  - Ucx/Bcx 1/29 E. Coli, R amp, amp/sulb, TMP/SMX. Susc CFZ, cipro/levo, gent/tobra, nitrofurantoin, tetra  - Treated with vanc/CFP -> 3 weeks levofloxacin through 2/26 (ureteral stent removed 2/15)     Active infection:  # MDR E. coli bacteremia 10/04/22, likely urinary source   # Perinephric fluid collection 10/05/22, had prior perinephric drain removed 08/30/22  - 3/12 developed fever and malaise at home, per family possible poor PO intake, diarrhea as well  - 3/14 UA 26 WBC, Ucx 10-50K E. Coli - adding on fosfomycin susc  - 3/14 Renal US 8.1 x 0.8 x 2.3 cm perinephric collection new from prior, considering small hematoma, complicated seroma, or less likely abscess  - 3/14 Bcx 2/2 E. Coli, susc-dose-dependent CFP, R cipro/levo, susc erta/mero, tobra, gent (R TMP/SMX, tetra, susc nitrofurantoin, mino)  - 3/14 CT A/P without contrast: perinephric stranding, 5.3 x 2.0 cm ill-defined perinephric collection limited evaluation without contrast. No stones seen. No definitive intra-abdominal abnormality  - 3/16 CT A/P (for SBO), no mention of kidney or collection  - 3/16 Bcx x 2 NGTD  - 3/17 Cdiff negative  - 3/17 repeat renal US: Complex perinephric collection up to 8.1 cm, new/similar collection up to 2.2 cm possibly communicates with prior collection  - 3/19 IR guided fluid collection drainage/cx + fluid Cr - pending  Rx: Vanc/CFP/Flagyl 3/14 -> CFP -> Mero 3/15 ->    # Thrush with presumed candidal esophagitis  - 3/19 with thrush, reports of odynophagia that is difficult to discern for pain from NGT     #Positive hepB core antibody, c/w prior infection, 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 Coli bacteremia presumed 2/2 UTI in last 6 weeks would be concerned about persistent nidus of infection, such as abscess. [place] and  CT with new perinephric collection that doesn't appear classic for abscess, but would be concerned for involvement in the infection with 2 recent severe infections. Pt had a post-operative drain in that space which was removed 08/30/22 while on antibiotics. Appreciate VIR assistance in pursuing safe drainage/sampling of this collection.    Diagnosis  Please obtain yeast screen of thrush  Follow-up repeat Bcx 3/16 to ensure clearance (particularly with possible niduses of infection such as abscess, LUE graft, etc.)  Follow-up Ucx added susceptibilities from 3/14 for possible suppressive options in the future (added fosfomycin)  Appreciate VIR assistance with sampling of perinephric fluid collection given new from prior study and pt with 2 episodes of UTI with bacteremia  Please send fluid sample for bacterial culture  Added on fluid creatinine  Inpatient vs. Outpatient referral to urogynecology given recurrent UTIs - can assist in evaluation for retention / urologic abnormalities that could be contributory    Management  For E. Coli UTI/bacteremia  Continue meropenem, renally dosed equivalent of 1g q8h, for now    For thrush and presumed candida esophagitis  Start IV micafungin 150 mg daily (dosed for esophagitis) - azoles require adjustment of immunosuppression regimen so will hold off for now    Antimicrobial prophylaxis required for host deficiency: transplant immunosuppression  Viral: Valganciclovir  PJP: TMP/SMX MWF    Intensive toxicity monitoring for prescription antimicrobials   CBC w/diff at least once per week  CMP at least once per week  clinical assessments for rashes or other skin changes    The [facility] ID service will continue to follow.           Please page the ID Transplant/Liquid Oncology Fellow consult at [phone] with questions.  Patient discussed with Dr. Raylene Miyamoto.    Julie Levins, MD  [facility] Division of Infectious Diseases    History of Present Illness:      External record(s): Primary team note: NGT started on suction, pt started on fluids .    Independent historian(s): no independent historian required.       Interval hx:  No events overnight, remains afebrile. NGT restarted on suction. Daughters at bedside. Pt with more abdominal pain and distention. Also noted mouth and throat pain, particularly with swallowing. Had BM yesterday morning, none since. No other side effects of medications at this time.      Vital Signs last 24 hours:  Temp:  [36.4 ??C (97.5 ??F)-36.9 ??C (98.4 ??F)] 36.6 ??C (97.9 ??F)  Heart Rate:  [65-68] 65  SpO2 Pulse:  [65] 65  Resp:  [16-21] 16  BP: (143-206)/(24-40) 163/40  MAP (mmHg):  [70-87] 84  SpO2:  [93 %-100 %] 100 %    Physical Exam:  Patient Lines/Drains/Airways Status       Active Active Lines, Drains, & Airways       Name Placement date Placement time Site Days    NG/OG Tube Decompression 16 Fr. Right nostril 10/07/22  1300  Right nostril  2    Peripheral IV 10/07/22 Anterior;Right Forearm 10/07/22  0420  Forearm  3    Arteriovenous Fistula - Vein Graft  Access 08/24/16 Left;Upper Arm 08/24/16  --  Arm  2238                    Const [x]  vital signs above    []  NAD, non-toxic appearance []  Chronically ill-appearing, non-distressed  Sitting in bed, awake/alert but slightly uncomfortable      Eyes [x]  Lids normal bilaterally,  conjunctiva anicteric and noninjected OU     [] PERRL  [] EOMI        ENMT [x]  Normal appearance of external nose and ears, no nasal discharge        []  MMM, no lesions on lips or gums []  No thrush, leukoplakia, oral lesions  []  Dentition good []  Edentulous []  Dental caries present  []  Hearing normal  []  TMs with good light reflexes bilaterally   White patches on roof of mouth c/w thrush  NGT in place, on low intermittent suction      Neck [x]  Neck of normal appearance and trachea midline        []  No thyromegaly, nodules, or tenderness   []  Full neck ROM  Supple      Lymph [x]  No LAD in neck     []  No LAD in supraclavicular area     []  No LAD in axillae   []  No LAD in epitrochlear chains     []  No LAD in inguinal areas        CV [x]  RRR            []  No peripheral edema     []  Pedal pulses intact   []  No abnormal heart sounds appreciated   []  Extremities WWP   II/VI murmur LUSB      Resp []  Normal WOB at rest    []  No breathlessness with speaking, no coughing  []  CTA anteriorly    []  CTA posteriorly    Breathing comfortably      GI [x]  Normal inspection, NTND   []  NABS     []  No umbilical hernia on exam       []  No hepatosplenomegaly     []  Inspection of perineal and perianal areas normal  Moderately distended and generally tender      GU []  Normal external genitalia     [] No urinary catheter present in urethra   []  No CVA tenderness    []  No tenderness over renal allograft        MSK []  No clubbing or cyanosis of hands       []  No vertebral point tenderness  []  No focal tenderness or abnormalities on palpation of joints in RUE, LUE, RLE, or LLE        Skin [x]  No rashes, lesions, or ulcers of visualized skin     []  Skin warm and dry to palpation   LUE prior graft in place with thrill      Neuro [x]  Face expression symmetric  []  Sensation to light touch grossly intact throughout    [x]  Moves extremities equally    []  No tremor noted        []  CNs II-XII grossly intact     []  DTRs normal and symmetric throughout []  Gait unremarkable        Psych []  Appropriate affect       []  Fluent speech         []  Attentive, good eye contact  []  Oriented to person, place, time          []  Judgment and insight are appropriate   Alert, speech normal        Data for Medical Decision Making     (3/14) EKG QTcF 438  I discussed mgm't w/qualified health care professional(s) involved in case: primary team regarding recs .    I reviewed CBC results (WBC 2.8, slightly low for pt), chemistry results (Labs all spuriously  low, suspect drawn near IV line), and micro result(s) (Repeat Bcx remain NGTD).    I independently visualized/interpreted not done.       Recent Labs   Lab Units 10/09/22  0601 10/07/22  0622 10/06/22  0419 10/06/22  0242 10/05/22  1110   WBC 10*9/L 3.8   < > 8.9   < > 10.6   HEMOGLOBIN g/dL 8.0*   < > 7.6*   < > 7.8*   PLATELET COUNT (1) 10*9/L 71*   < > 51*   < > 64*   NEUTRO ABS 10*9/L  --   --   --   --  10.1*   LYMPHO ABS 10*9/L  --   --   --   --  0.3*   EOSINO ABS 10*9/L  --   --   --   --  0.0   BUN mg/dL 47*   < > 45* - 45*   < > 48*   CREATININE mg/dL 1.61*   < > 0.96* - 0.45*   < > 1.79*   AST U/L  --   --  20   < > 21   ALT U/L  --   --  17   < > 19   BILIRUBIN TOTAL mg/dL  --   --  0.5   < > 0.4   ALK PHOS U/L  --   --  50   < > 43*   POTASSIUM WHOLE BLOOD   --    < > 4.9*   < >  --    POTASSIUM mmol/L 4.8   < > 4.9* - 4.9*   < > 5.4*   MAGNESIUM mg/dL 1.9   < > 1.5*   < > 1.7   PHOSPHORUS mg/dL 2.9  --   --   --  2.2*   CALCIUM mg/dL 9.0   < > 8.3* - 8.3*   < > 8.2*    < > = values in this interval not displayed.       Lab Results   Component Value Date    Tacrolimus, Trough 10.1 10/09/2022       Microbiology:  Microbiology Results (last day)       Procedure Component Value Date/Time Date/Time    Aerobic Susceptibility-Mayo [[id number]] Collected: 10/05/22 1002    Lab Status: In process Specimen: Urine from Clean Catch Updated: 10/10/22 0725    Blood Culture #1 [[id number]]  (Normal) Collected: 10/07/22 0620    Lab Status: Preliminary result Specimen: Blood from 1 Peripheral Draw Updated: 10/10/22 0645     Blood Culture, Routine No Growth at 72 hours    Blood Culture #2 [[id number]]  (Normal) Collected: 10/07/22 0624    Lab Status: Preliminary result Specimen: Blood from 1 Peripheral Draw Updated: 10/10/22 0645     Blood Culture, Routine No Growth at 72 hours    Urine Culture [[id number]]  (Abnormal) Collected: 10/05/22 1002    Lab Status: Preliminary result Specimen: Urine from Clean Catch Updated: 10/09/22 1428     Urine Culture, Comprehensive 10,000 to 50,000 CFU/mL Escherichia coli     Comment: Susceptibility performed on Previous Isolate - Blood culture 10/05/22  Processed at Physicians Request       Narrative:      Specimen Source: Clean Catch    Micro Add-on [[id number]] Collected: 10/05/22 1002    Lab Status: In process Specimen: Urine from Clean Catch Updated: 10/09/22 1424  Imaging:  US Renal Transplant Limited    Result Date: 10/08/2022  EXAM: US RENAL TRANSPLANT LIMITED ACCESSION: [id number] UN     CLINICAL INDICATION: evaluation of perinephric fluid collection     COMPARISON: Renal transplant ultrasound 10/05/2022, CT abdomen pelvis 10/07/2022     TECHNIQUE:  Static and cine images of the renal transplant and urinary bladder were obtained.     FINDINGS: TRANSPLANTED KIDNEY: The renal transplant was located in the left lower quadrant. Normal size and echogenicity.  No solid masses or calculi. No perinephric collections identified. No hydronephrosis.      Renal transplant:   12.4  x  4.6   x 6.1 cm     Redemonstrated mildly complex perinephric fluid collection measuring 8.1 x 0.9 x 1.6 cm. New upper pole mildly complex perinephric collection with possible communication with the dominant collection. The upper portion of the collection measures 2.4 x 2.2 x 0.8 cm     BLADDER: Unremarkable.      Bladder volume: 190   mL     OTHER: Trace free fluid in the lower abdomen. Loop of dilated fluid-filled small bowel adjacent to the transplant kidney measuring up to 3.3 cm in diameter.         Complex perinephric fluid collection measuring up to 8.1 cm suggestive of persistent hematoma. A new but similar complex collection adjacent to the upper pole of the transplant kidney measures up to 2.2 cm and possibly communicates with the dominant portion of the collection.     Mildly dilated loop of fluid-filled small bowel adjacent to the transplant kidney, similar to recent CT.                     Echocardiogram Follow Up/Limited Echo    Result Date: 10/08/2022  Patient Info Name:     Caity Victorian Age:     71 years DOB:     January 02, 1951 Gender:     Female MRN:     [id number] Accession #:     [id number] UN Account #:     [id number] Ht:     147 cm Wt:     59 kg BSA:     1.57 m2 BP:     186 /     29 mmHg HR:     76 bpm Heart Rhythm:     Sinus Rhythm Exam Date:     10/08/2022 10:58 AM Admit Date:     10/05/2022     Exam Type:     ECHOCARDIOGRAM FOLLOW UP/LIMITED ECHO     Technical Quality:     Fair     Staff Sonographer:     Sedonia Small Rodgers-Jones     Study Info Indications      - worsening pulm edema \\E &E\ effusions Procedure(s)   Limited two-dimensional, color flow and Doppler transthoracic echocardiogram is performed.         Summary   1. The left ventricle is normal in size with mildly to moderately increased wall thickness.   2. The left ventricular systolic function is normal, LVEF is visually estimated at > 55%.   3. There is grade I diastolic dysfunction (impaired relaxation).   4. The aortic valve is trileaflet with mildly thickened leaflets with normal excursion.   5. The left atrium is mildly dilated in size.   6. The right ventricle is normal in size, with normal systolic function.         Left Ventricle   The left ventricle  is normal in size with mildly to moderately increased wall thickness. The left ventricular systolic function is normal, LVEF is visually estimated at > 55%. There is grade I diastolic dysfunction (impaired relaxation).     Right Ventricle   The right ventricle is normal in size, with normal systolic function.         Left Atrium   The left atrium is mildly dilated in size.     Right Atrium   The right atrium is normal in size.         Aortic Valve   The aortic valve is trileaflet with mildly thickened leaflets with normal excursion. There is trivial aortic regurgitation. There is no evidence of a significant transvalvular gradient.     Mitral Valve   The mitral valve leaflets are normal with normal leaflet mobility. There is no significant mitral valve regurgitation.     Tricuspid Valve   The tricuspid valve leaflets are normal, with normal leaflet mobility. There is mild tricuspid regurgitation. There is no pulmonary hypertension.     Pulmonic Valve   The pulmonic valve is normal. There is mild pulmonic regurgitation. There is no evidence of a significant transvalvular gradient.         Aorta   The aorta is normal in size in the visualized segments.     Inferior Vena Cava   IVC size and inspiratory change suggest normal right atrial pressure. (0-5 mmHg).     Pericardium/Pleural   There is no pericardial effusion.         Ventricles ---------------------------------------------------------------------- Name                                 Value        Normal ----------------------------------------------------------------------     LV Dimensions 2D/MM ----------------------------------------------------------------------  IVS Diastolic Thickness (2D)                                1.3 cm       0.6-0.9 LVID Diastole (2D)                  2.8 cm       3.8-5.2  LVPW Diastolic Thickness (2D)                                1.3 cm       0.6-0.9 LVID Systole (2D)                   1.9 cm       2.2-3.5     Atria ---------------------------------------------------------------------- Name                                 Value        Normal ----------------------------------------------------------------------     LA Dimensions ---------------------------------------------------------------------- LA Dimension (2D)                   4.5 cm       2.7-3.8     Aortic Valve ---------------------------------------------------------------------- Name                                 Value  Normal ----------------------------------------------------------------------     AV Doppler ---------------------------------------------------------------------- AV Peak Velocity                   1.9 m/s               AV Peak Gradient                   15 mmHg     Mitral Valve ---------------------------------------------------------------------- Name                                 Value        Normal ----------------------------------------------------------------------     MV Diastolic Function ---------------------------------------------------------------------- MV E Peak Velocity                 51 cm/s               MV A Peak Velocity                 90 cm/s               MV E/A                                 0.6                   MV Annular TDI ---------------------------------------------------------------------- MV Septal e' Velocity             4.0 cm/s >=8.0 MV E/e' (Septal)                      12.6               MV Lateral e' Velocity            5.4 cm/s        >=10.0 MV E/e' (Lateral)                      9.3               MV e' Average                     4.7 cm/s               MV E/e' (Average)                     11.0     Tricuspid Valve ---------------------------------------------------------------------- Name                                 Value        Normal ----------------------------------------------------------------------     Estimated PAP/RSVP ---------------------------------------------------------------------- RA Pressure                         3 mmHg           <=5     Aorta ---------------------------------------------------------------------- Name                                 Value        Normal ----------------------------------------------------------------------     Ascending Aorta ---------------------------------------------------------------------- Ao Root Diameter (2D)  2.9 cm               Ao Root Diam Index (2D)          1.8 cm/m2         Report Signatures Finalized by Carin Hock  MD on 10/08/2022 03:59 PM    CT Abdomen Pelvis Wo Contrast    Result Date: 10/08/2022  EXAM: CT ABDOMEN PELVIS WO CONTRAST ACCESSION: [id number] UN CLINICAL INDICATION: 72 years old with Eval for obstruction      COMPARISON: CT abdomen pelvis 10/05/2022     TECHNIQUE: A spiral CT scan was obtained without IV contrast from the lung bases to the pubic symphysis.  Images were reconstructed in the axial plane. Coronal and sagittal reformatted images were also provided for further evaluation.     Evaluation of the solid organs and vasculature is limited in the absence of intravenous contrast.         FINDINGS:     LOWER CHEST: Trace right pleural effusion. Bibasilar atelectasis. Aortic valve and coronary artery calcifications.     LIVER: Normal liver contour. 1.1 cm posterior right hepatic lesion, most likely a benign cyst or biliary hamartoma (2:32). No suspicious focal liver lesion on non-contrast examination.     BILIARY: The gallbladder is normal in appearance. No intrahepatic biliary ductal dilatation.     SPLEEN: Unchanged posterior splenic cyst.     PANCREAS: Normal pancreatic contour without signs of inflammation or gross ductal dilatation.     ADRENAL GLANDS: Normal appearance of the adrenal glands.     KIDNEYS/URETERS: Atrophy of the bilateral native kidneys. Left lower quadrant transplant kidney. Similar appearance of mild enlargement of the transplant kidney with perinephric stranding/inflammation. Bilateral nonobstructing nephrolithiasis of the native kidneys. No ureteral dilatation or collecting system distention.     BLADDER: Unremarkable.     REPRODUCTIVE ORGANS: Uterus and ovaries are surgically absent.     GI TRACT: Enteric tube in place which is coiled in the stomach with tip pointing towards the gastric fundus. There are multiple dilated fluid-filled loops of small bowel throughout the abdomen measuring up to 3.7 cm. There are multiple points of bowel caliber change centered in an area in the the right lower quadrant with localized mesenteric stranding/vascular congestion and free fluid (2:91). Evidence of bowel ischemia cannot be evaluated in the absence of intravenous contrast. Colon diverticulosis without evidence of diverticulitis. Normal appendix.     PERITONEUM/RETROPERITONEUM AND MESENTERY: No free air. Small volume abdominopelvic ascites, increased from prior exam. Mesenteric edema/stranding localized in the right lower quadrant. No organized fluid collection.     VASCULATURE: Normal caliber aorta. Otherwise, limited evaluation without contrast.     LYMPH NODES: No adenopathy.     BONES and SOFT TISSUES: No aggressive osseous lesions. Body wall edema.         Concern for developing high-grade small bowel obstruction due to adhesions.     Presence of bowel ischemia cannot be evaluated in the absence of intravenous contrast.     Consider surgical consultation.         XR Abdomen 1 View    Result Date: 10/07/2022  EXAM: XR ABDOMEN 1 VIEW ACCESSION: [id number] UN     CLINICAL INDICATION: 72 years old with INTESTINAL OBSTRUCTION      COMPARISON: 10/07/2022     TECHNIQUE: Supine view of the abdomen, 2 images(s)     FINDINGS: Slight increase in small bowel dilation, now 4.4 cm. Bowel gas is noted in a colonic  loop in the midabdomen, presumably redundant sigmoid colon. Multiple surgical clips are noted in the left lower quadrant.         Increasing small bowel dilatation with gas filled loop of sigmoid colon. Findings suggest ileus related to left lower quadrant transplant. If abdominal pain persists or worsens, consider CT abdomen pelvis.         XR Abdomen 1 View    Result Date: 10/07/2022  EXAM: XR ABDOMEN 1 VIEW ACCESSION: [id number] UN     CLINICAL INDICATION: 72 years old with NGT (CATHETER NON VASC FIT & ADJ)      COMPARISON: Abdominal radiograph 10/07/2022     TECHNIQUE: Supine view of the abdomen, 1 images(s)     FINDINGS: Nonweighted enteric tube with redundant tubing coiled within the stomach, the tip and sideport overlie the left upper quadrant. Similar gaseous distention of the small bowel in the upper abdomen measuring up to 3.8 cm. Gas is also seen within nondistended colon. See same-day chest radiograph for evaluation of structures above the diaphragm.         --Nonweighted enteric tube with redundant tubing coiled within the stomach, the tip and sideport overlie the left upper quadrant, likely at the gastric fundus.     --Similar gas-filled mildly dilated loops of small bowel within the upper abdomen. Gas is also seen within nondistended large bowel. Findings can be seen in the setting of ileus.         XR Chest Portable    Result Date: 10/07/2022  EXAM: XR CHEST PORTABLE ACCESSION: [id number] UN     CLINICAL INDICATION: Dyspnea.     TECHNIQUE: Single View AP Chest Radiograph.     COMPARISON: Previous day 2:43. FINDINGS:     No new consolidation, pleural fluid, or pneumothorax. Stable cardiomediastinal contour with calcified aorta. Low lung volumes. Vascular stent on the left. Equivocal mild interstitial edema accentuated by low volumes.                 Possible mild interstitial edema accentuated by low lung volumes.

## 2022-10-10 NOTE — Unmapped (Signed)
General Surgery Progress Note      Assessment:   72 y.o. female with complicated previous medical history status post DCD DDKT in 07/2022, hypertension, type 2 diabetes, hypothyroidism, who was admitted for GNR bacteremia. General surgery consulted 3/17 as patient was found to have dilated loops of small bowel with multiple transition points in the RLE on CT, concerning for SBO. Patient examined again this morning with increased epigastric tenderness. Although patient reports BM, there is concern that patient's obstruction is not resolved given high volume NGT output and no passage of flatus. We recommend gastrograffin challenge to assess for resolution of obstruction.      Plan:   -Gastrograffin challenge today (may administer via NGT)   -NPO, IVF   -We will continue to follow     Interval/Subjective:  NAEO. Reports increased abd tenderness. 1.3L bilious NGT output. 1 BM but denies flatus.        Objective:     Vital signs in last 24 hours:  Temp:  [36.4 ??C (97.5 ??F)-36.9 ??C (98.4 ??F)] 36.9 ??C (98.4 ??F)  Heart Rate:  [65-68] 65  SpO2 Pulse:  [65-66] 65  Resp:  [17-21] 18  BP: (143-206)/(24-39) 179/27  MAP (mmHg):  [70-87] 85  SpO2:  [93 %-100 %] 100 %    Intake/Output last 24 hours:  I/O last 3 completed shifts:  In: 3468.3 [P.O.:200; I.V.:2768.3; IV Piggyback:500]  Out: 3650 [Urine:1250; Emesis/NG output:2400]    Physical Exam:  General: Well-appearing and in no acute distress; NGT in place with bilious drainage  Pulm: Normal respiratory effort    Cardiac: regular rate and rhythm   Abdomen: Soft, tender epigastrium and LUQ, mildly distended.        Data Review:    I have reviewed the labs and studies from the last 24 hours.      Imaging: Radiology studies were personally reviewed

## 2022-10-10 NOTE — Unmapped (Signed)
A&O x4, sometimes forgetful. NSR per tele, VSS. RA-2L [place]. Adequate UOP. PRN phenol provided for throat soreness, PRN melatonin provided to promote rest, PRN labetelol provided for elevated BP. Patient in chair at this time.     Problem: Adult Inpatient Plan of Care  Goal: Absence of Hospital-Acquired Illness or Injury  Intervention: Identify and Manage Fall Risk  Recent Flowsheet Documentation  Taken 10/09/2022 2000 by Graciella Belton, RN  Safety Interventions:   low bed   lighting adjusted for tasks/safety  Intervention: Prevent Skin Injury  Recent Flowsheet Documentation  Taken 10/10/2022 0200 by Graciella Belton, RN  Positioning for Skin: Supine/Back  Taken 10/10/2022 0000 by Graciella Belton, RN  Positioning for Skin: Supine/Back  Taken 10/09/2022 2200 by Graciella Belton, RN  Positioning for Skin: Supine/Back  Taken 10/09/2022 2020 by Graciella Belton, RN  Positioning for Skin: Supine/Back  Device Skin Pressure Protection: absorbent pad utilized/changed  Skin Protection: incontinence pads utilized  Taken 10/09/2022 2000 by Graciella Belton, RN  Positioning for Skin: Supine/Back  Intervention: Prevent and Manage VTE (Venous Thromboembolism) Risk  Recent Flowsheet Documentation  Taken 10/10/2022 0200 by Graciella Belton, RN  Anti-Embolism Device Type: SCD, Knee  Anti-Embolism Intervention: Refused  Anti-Embolism Device Location: BLE  Taken 10/10/2022 0000 by Graciella Belton, RN  Anti-Embolism Device Type: SCD, Knee  Anti-Embolism Intervention: Refused  Anti-Embolism Device Location: BLE  Taken 10/09/2022 2020 by Graciella Belton, RN  Anti-Embolism Device Type: SCD, Knee  Anti-Embolism Intervention: Refused  Anti-Embolism Device Location: BLE  Intervention: Prevent Infection  Recent Flowsheet Documentation  Taken 10/09/2022 2000 by Graciella Belton, RN  Infection Prevention: cohorting utilized     Problem: Infection  Goal: Absence of Infection Signs and Symptoms  Intervention: Prevent or Manage Infection  Recent Flowsheet Documentation  Taken 10/09/2022 2000 by Graciella Belton, RN  Infection Management: aseptic technique maintained  Isolation Precautions: enteric precautions maintained     Problem: Fall Injury Risk  Goal: Absence of Fall and Fall-Related Injury  Intervention: Promote Injury-Free Environment  Recent Flowsheet Documentation  Taken 10/09/2022 2000 by Graciella Belton, RN  Safety Interventions:   low bed   lighting adjusted for tasks/safety     Problem: Skin Injury Risk Increased  Goal: Skin Health and Integrity  Intervention: Optimize Skin Protection  Recent Flowsheet Documentation  Taken 10/09/2022 2020 by Graciella Belton, RN  Pressure Reduction Techniques: frequent weight shift encouraged  Pressure Reduction Devices: pressure-redistributing mattress utilized  Skin Protection: incontinence pads utilized

## 2022-10-11 LAB — RENAL FUNCTION PANEL
ALBUMIN: 3.3 g/dL — ABNORMAL LOW (ref 3.4–5.0)
ANION GAP: 6 mmol/L (ref 5–14)
BLOOD UREA NITROGEN: 51 mg/dL — ABNORMAL HIGH (ref 9–23)
BUN / CREAT RATIO: 34
CALCIUM: 9.4 mg/dL (ref 8.7–10.4)
CHLORIDE: 106 mmol/L (ref 98–107)
CO2: 26 mmol/L (ref 20.0–31.0)
CREATININE: 1.52 mg/dL — ABNORMAL HIGH
EGFR CKD-EPI (2021) FEMALE: 36 mL/min/{1.73_m2} — ABNORMAL LOW (ref >=60)
GLUCOSE RANDOM: 160 mg/dL (ref 70–179)
PHOSPHORUS: 3 mg/dL (ref 2.4–5.1)
POTASSIUM: 4.9 mmol/L — ABNORMAL HIGH (ref 3.4–4.8)
SODIUM: 138 mmol/L (ref 135–145)

## 2022-10-11 LAB — CBC
HEMATOCRIT: 29.4 % — ABNORMAL LOW (ref 34.0–44.0)
HEMOGLOBIN: 10.2 g/dL — ABNORMAL LOW (ref 11.3–14.9)
MEAN CORPUSCULAR HEMOGLOBIN CONC: 34.6 g/dL (ref 32.0–36.0)
MEAN CORPUSCULAR HEMOGLOBIN: 32.2 pg (ref 25.9–32.4)
MEAN CORPUSCULAR VOLUME: 93 fL (ref 77.6–95.7)
MEAN PLATELET VOLUME: 8.2 fL (ref 6.8–10.7)
PLATELET COUNT: 111 10*9/L — ABNORMAL LOW (ref 150–450)
RED BLOOD CELL COUNT: 3.17 10*12/L — ABNORMAL LOW (ref 3.95–5.13)
RED CELL DISTRIBUTION WIDTH: 15.4 % — ABNORMAL HIGH (ref 12.2–15.2)
WBC ADJUSTED: 3.7 10*9/L (ref 3.6–11.2)

## 2022-10-11 LAB — LACTATE, VENOUS, WHOLE BLOOD: LACTATE BLOOD VENOUS: 1.2 mmol/L (ref 0.5–1.8)

## 2022-10-11 LAB — MAGNESIUM: MAGNESIUM: 2.2 mg/dL (ref 1.6–2.6)

## 2022-10-11 MED ADMIN — hydrALAZINE (APRESOLINE) tablet 25 mg: 25 mg | ORAL | @ 13:00:00

## 2022-10-11 MED ADMIN — labetalol (NORMODYNE) injection: 20 mg | INTRAVENOUS | @ 16:00:00

## 2022-10-11 MED ADMIN — methylPREDNISolone sodium succinate (PF) (SOLU-Medrol) injection 5.2 mg: 5 mg | INTRAVENOUS | @ 19:00:00

## 2022-10-11 MED ADMIN — labetalol (NORMODYNE) injection: 20 mg | INTRAVENOUS | @ 21:00:00 | Stop: 2022-10-11

## 2022-10-11 MED ADMIN — tacrolimus (PROGRAF) 1.5mg combo product: 1.5 mg | SUBLINGUAL | @ 18:00:00

## 2022-10-11 MED ADMIN — pantoprazole (Protonix) EC tablet 40 mg: 40 mg | ORAL | @ 13:00:00

## 2022-10-11 MED ADMIN — HYDROmorphone (PF) injection Syrg 0.25 mg: .25 mg | INTRAVENOUS | @ 03:00:00 | Stop: 2022-10-21

## 2022-10-11 MED ADMIN — HYDROmorphone (PF) injection Syrg 0.25 mg: .25 mg | INTRAVENOUS | @ 09:00:00 | Stop: 2022-10-21

## 2022-10-11 MED ADMIN — labetalol (NORMODYNE) injection: 20 mg | INTRAVENOUS | @ 09:00:00 | Stop: 2022-10-11

## 2022-10-11 MED ADMIN — labetalol (NORMODYNE) injection: 20 mg | INTRAVENOUS | @ 18:00:00 | Stop: 2022-10-11

## 2022-10-11 MED ADMIN — mycophenolate (CELLCEPT) 500 mg in dextrose 5 % 100 mL IVPB: 500 mg | INTRAVENOUS | @ 04:00:00

## 2022-10-11 MED ADMIN — atorvastatin (LIPITOR) tablet 10 mg: 10 mg | ORAL | @ 13:00:00

## 2022-10-11 MED ADMIN — meropenem (MERREM) 1 g in sodium chloride 0.9 % (NS) 100 mL IVPB-MBP: 1 g | INTRAVENOUS | @ 02:00:00 | Stop: 2022-10-15

## 2022-10-11 MED ADMIN — amlodipine (NORVASC) tablet 5 mg: 5 mg | ORAL | @ 13:00:00

## 2022-10-11 MED ADMIN — meropenem (MERREM) 1 g in sodium chloride 0.9 % (NS) 100 mL IVPB-MBP: 1 g | INTRAVENOUS | @ 13:00:00 | Stop: 2022-10-15

## 2022-10-11 MED ADMIN — heparin (porcine) 5,000 unit/mL injection 5,000 Units: 5000 [IU] | SUBCUTANEOUS | @ 02:00:00

## 2022-10-11 MED ADMIN — labetalol (NORMODYNE) injection: 20 mg | INTRAVENOUS | @ 12:00:00 | Stop: 2022-10-11

## 2022-10-11 MED ADMIN — mycophenolate (CELLCEPT) 500 mg in dextrose 5 % 100 mL IVPB: 500 mg | INTRAVENOUS | @ 14:00:00

## 2022-10-11 MED ADMIN — insulin lispro (HumaLOG) injection 0-20 Units: 0-20 [IU] | SUBCUTANEOUS | @ 17:00:00

## 2022-10-11 MED ADMIN — magnesium oxide-Mg AA chelate (Magnesium Plus Protein) 2 tablet: 2 | ORAL | @ 13:00:00

## 2022-10-11 MED ADMIN — valGANciclovir (VALCYTE) tablet 450 mg: 450 mg | ORAL | @ 13:00:00

## 2022-10-11 MED ADMIN — heparin (porcine) 5,000 unit/mL injection 5,000 Units: 5000 [IU] | SUBCUTANEOUS | @ 18:00:00

## 2022-10-11 MED ADMIN — carvedilol (COREG) tablet 25 mg: 25 mg | ORAL | @ 13:00:00

## 2022-10-11 MED ADMIN — micafungin (MYCAMINE) 150 mg in sodium chloride (NS) 0.9 % 100 mL IVPB: 150 mg | INTRAVENOUS | @ 19:00:00 | Stop: 2022-10-17

## 2022-10-11 MED ADMIN — ondansetron (ZOFRAN) injection 4 mg: 4 mg | INTRAVENOUS | @ 09:00:00

## 2022-10-11 MED ADMIN — insulin glargine (LANTUS) injection 6 Units: 6 [IU] | SUBCUTANEOUS | @ 03:00:00

## 2022-10-11 MED ADMIN — sulfamethoxazole-trimethoprim (BACTRIM) 400-80 mg tablet 80 mg of trimethoprim: 1 | ORAL | @ 14:00:00 | Stop: 2023-01-15

## 2022-10-11 MED ADMIN — sodium chloride (NS) 0.9 % infusion: 75 mL/h | INTRAVENOUS | @ 18:00:00 | Stop: 2022-10-12

## 2022-10-11 MED ADMIN — cloNIDine HCL (CATAPRES) tablet 0.1 mg: .1 mg | ORAL | @ 22:00:00

## 2022-10-11 MED ADMIN — heparin (porcine) 5,000 unit/mL injection 5,000 Units: 5000 [IU] | SUBCUTANEOUS | @ 12:00:00

## 2022-10-11 MED ADMIN — levothyroxine (SYNTHROID) tablet 112 mcg: 112 ug | ORAL | @ 13:00:00

## 2022-10-11 MED ADMIN — prochlorperazine (COMPAZINE) injection 10 mg: 10 mg | INTRAMUSCULAR | @ 04:00:00

## 2022-10-11 NOTE — Unmapped (Signed)
PHYSICAL THERAPY  Evaluation (10/11/22 0912)          Patient Name:  Julie [name]       Medical Record Number: [id number]   Date of Birth: Apr 09, 1951  Sex: Female        Treatment Diagnosis: decreased activity tolerance, decreased endurance     Activity Tolerance: Tolerated treatment well     ASSESSMENT  Problem List: Gait deviation, Decreased mobility, Fall risk, Decreased endurance      Assessment : Julie [name] is a 72 y.o. female whose presentation is complicated by DDKT, HTN, hypothyroidism, T2DM that presented to [facility] with fevers, nausea and vomiting, diarrhea found to have E. Coli bacteremia. Course c/b SBO, fluid overload.     [name] presents near functional baseline this date. Performs transfers and ambulation with grossly supervision - SBA. Pt and husband at bedside deny further questions/concerns regarding mobility or safety post acute stay. No further skilled acute PT indicated at this time - DC PT 3/20. Recommending 3x with no DME needs.      Today's Interventions: PT EVAL - mobility as noted below - pt education re: PT role, POC, safety with mobility, use of call bell, OOB with staff only, use of AD.     Personal Factors/Comorbidities Present: 1-2   Examination of Body System: Activity/participation  Clinical Presentation: Stable    Clinical Decision Making: Low        PLAN  Planned Frequency of Treatment:  D/C Services for: D/C Services       Planned Interventions:       Post-Discharge Physical Therapy Recommendations:  PT Post Acute Discharge Recommendations: 3x weekly      PT DME Recommendations: None            Goals:   Patient and Family Goals: go home        Prognosis:  Good  Positive Indicators: CLOF, PLOF, caregiver support  Barriers to Discharge: None     SUBJECTIVE  Patient reports: Agreeable to therapy -  I was good at home        Prior Functional Status: Pt reports she was independent without AD PTA. Daughter to stay with pt and husband at time of discharge. Pt reports when she went home she was doing well she wasn't using AD around the home. Pt reports fall was due to wearing the gown that was too long for her so she stepped on it and tripped and scrapped her L knee.  Equipment available at home: Rolling walker        Past Medical History:   Diagnosis Date    Anemia     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)     since July 2017    Hypertension     Hypothyroidism (acquired)     Kidney transplant status, cadaveric 08/08/2022    Type 2 diabetes mellitus (CMS-HCC)             Social History     Tobacco Use    Smoking status: Former     Current packs/day: 0.00     Average packs/day: 0.8 packs/day for 30.0 years (22.5 ttl pk-yrs)     Types: Cigarettes     Start date: 06/06/1976     Quit date: 06/06/2006     Years since quitting: 16.3    Smokeless tobacco: Former   Substance Use Topics    Alcohol use: Yes     Comment: only special occasions  Past Surgical History:   Procedure Laterality Date    CATARACT EXTRACTION      HYSTERECTOMY      OOPHORECTOMY      PERITONEAL CATHETER INSERTION      PR LAP INSERTION TUNNELED INTRAPERITONEAL CATHETER N/A 09/27/2018    Procedure: LAPAROSCOPY, SURGICAL; WITH INSERTION OF INTRAPERITONEAL CANNULA OR CATHETER, PERMANENT;  Surgeon: Leona Carry, MD;  Location: MAIN OR [place];  Service: Transplant    PR LAP INSERTION TUNNELED INTRAPERITONEAL CATHETER N/A 04/08/2019    Procedure: LAPAROSCOPY, SURGICAL; WITH INSERTION OF INTRAPERITONEAL CANNULA OR CATHETER, PERMANENT;  Surgeon: Leona Carry, MD;  Location: MAIN OR [facility];  Service: Transplant    PR LAP REVISE INTRAPERITONEAL CATHETER N/A 08/26/2019    Procedure: LAPAROSCOPY, SURGICAL; W/REVIS PREV PLACED INTRAPERITONEAL CANNULA/CATH, REMOV INTRALUMIN OBSTRUCT MATERIAL;  Surgeon: Leona Carry, MD;  Location: MAIN OR [facility];  Service: Transplant    PR REMOVAL TUNNELED INTRAPERITONEAL CATHETER N/A 05/06/2018    Procedure: REMOVAL OF PERMANENT INTRAPERITONEAL CANNULA OR CATHETER; Surgeon: Leona Carry, MD;  Location: MAIN OR [facility];  Service: Transplant    PR REMOVE PERITONEAL FOREIGN BODY N/A 04/08/2019    Procedure: Removal Of Peritoneal Of Foreign Body From Peritoneal Cavity;  Surgeon: Leona Carry, MD;  Location: MAIN OR [facility];  Service: Transplant    PR TRANSPLANT,PREP RENAL GRAFT/ARTERIAL Right 08/06/2022    Procedure: [facility] RECONSTRUCTION CADAVER/LIVING DONOR RENAL ALLOGRAFT PRIOR TO TRANSPLANT; ARTERIAL ANASTOMOSIS EAC;  Surgeon: Toledo, Lilyan Punt, MD;  Location: MAIN OR [facility];  Service: Transplant    PR TRANSPLANTATION OF KIDNEY Right 08/06/2022    Procedure: RENAL ALLOTRANSPLANTATION, IMPLANTATION OF GRAFT; WITHOUT RECIPIENT NEPHRECTOMY;  Surgeon: Toledo, Lilyan Punt, MD;  Location: MAIN OR [facility];  Service: Transplant    TUBAL LIGATION               Family History   Problem Relation Age of Onset    Diabetes Mother     Alzheimer's disease Father     Diabetes Sister     Diabetes Sister     Diabetes Sister     Anesthesia problems Neg Hx     Bleeding Disorder Neg Hx         Allergies: Oxycodone and Valacyclovir      AM-PAC-6 click    Difficulty turning over In bed?: A Little - Minimal/Contact Guard Assist/Supervision  Difficulty sitting down/standing up from chair with arms? : A Little - Minimal/Contact Guard Assist/Supervision  Difficulty moving from supine to sitting on edge of bed?: A Little - Minimal/Contact Guard Assist/Supervision  Help moving to and from bed from wheelchair?: A Little - Minimal/Contact Guard Assist/Supervision  Help currently needed walking in a hospital room?: A Little - Minimal/Contact Guard Assist/Supervision  Help currently needed climbing 3-5 steps with railing?: A Little - Minimal/Contact Guard Assist/Supervision    Basic Mobility Score:  Basic Mobility Score 6 click: 18    6 click  Score (in points): % of Functional Impairment, Limitation, Restriction  6: 100% impaired, limited, restricted  7-8: At least 80%, but less than 100% impaired, limited restricted  9-13: At least 60%, but less than 80% impaired, limited restricted  14-19: At least 40%, but less than 60% impaired, limited restricted  20-22: At least 20%, but less than 40% impaired, limited restricted  23: At least 1%, but less than 20% impaired, limited restricted  24: 0% impaired, limited restricted       Objective Findings  Precautions / Restrictions  Precautions: Falls precautions, Protective precautions,  Isolation precautions (pt placed on enteric after session ended)  Weight Bearing Status: Non-applicable  Required Braces or Orthoses: Non-applicable     Communication Preference: Verbal (pt pollitely declines need and use of interpreter)          Pain Comments: denies pain  Medical Tests / Procedures: EMR reviewed  Equipment / Environment: Vascular access (PIV, TLC, Port-a-cath, PICC), Supplemental oxygen, Telemetry, NGT (1L [place])     Vitals/Orthostatics : VSS per monitor, NAD with mobility, asymptomatic with positional changes     Living Situation  Living Environment: House  Lives With: Spouse, Daughter  Home Living: One level home, Stairs to enter with rails, Tub/shower unit, Standard height toilet  Rail placement (outside): Rail on right side  Number of Stairs to Enter (outside): 3  Caregiver Identified?: Yes  Caregiver Availability: 24 hours  Caregiver Ability: Limited lifting      Cognition: WFL  Orientation: Oriented x4  Visual/Perception: Within Functional Limits  Hearing: Hearing aids present     Skin Inspection: Intact where visualized     Upper Extremities  UE ROM: Right WFL, Left WFL  UE Strength: Right WFL, Left WFL    Lower Extremities  LE ROM: Left WFL, Right WFL  LE Strength: Right WFL, Left WFL    Trunk and Cervical ROM  Cervical ROM: WFL  Trunk ROM: WFL     Coordination: WFL  Proprioception: WFL  Sensation: WFL  Posture: WFL    Static Sitting-Level of Assistance: Administrator, sports of Assistance: Insurance account manager of Assistance: Stand by assistance  Dynamic Standing - Level of Assistance: Stand by assistance  Standing Balance comments: with rollator      Bed Mobility: Supine to Sit  Supine to Sit assistance level: Modified independent, requires aide device or extra time  Bed Mobility: HOB slightly elevated, use of bed rails.     Transfers: Sit to Stand  Sit to Stand assistance level: Standby assist, set-up cues, supervision of patient - no hands on  Transfer comments: with rollator. No gross LOB noted.      Gait Level of Assistance: Standby assist, set-up cues, supervision of patient - no hands on  Gait Assistive Device: Four wheel walker  Gait Distance Ambulated (ft): 250 ft  Skilled Treatment Performed: ambulated ~244ft with SBA and rollator. Steady gat speed, no gross LOB or gait deficits noted.     Stairs: NT directly - anticipate no difficulty with stair navigation given ambulation and transfer performance - pt endorses that family will help with stairs as they have in the past.            Endurance: Good    Patient at end of session: All needs in reach, Alarm activated, In chair, Friends/Family present, Nurse notified, Lines intact    Physical Therapy Session Duration  PT Individual [mins]: 24     Medical Staff Made Aware: RN Dotti cleared and in room at end of session    I attest that I have reviewed the above information.  Signed: Coy Saunas, PT  Filed 10/11/2022

## 2022-10-11 NOTE — Unmapped (Signed)
Neuro: Pt alert and oriented x 4. Pt refusing to go for a CT d/t so many xrays being taken. Pt stated you should have what you need with those 3 xrays. Will continue to encourage patient to go. Family at bedside encouraging her to go.    Cardiac: Pt NSR on monitor. Elevated BP throughout the day, multiple labetalol IV administered. Communicated multiple times throughout the day with MD, Myrtice Lauth about the blood pressure. New orders obtained and carried out - see flowsheet and MAR for details.    Resp: Pt on RA requesting 02 at times, 2L while sleeping    Gi/Gu: Pt voiding. No BM this shift    Pain: Pt denies pain throughout my shift.      Problem: Adult Inpatient Plan of Care  Goal: Plan of Care Review  Outcome: Progressing  Goal: Patient-Specific Goal (Individualized)  Outcome: Progressing  Goal: Absence of Hospital-Acquired Illness or Injury  Outcome: Progressing  Intervention: Identify and Manage Fall Risk  Recent Flowsheet Documentation  Taken 10/11/2022 0900 by Leodis Liverpool, RN  Safety Interventions: family at bedside  Taken 10/11/2022 0800 by Leodis Liverpool, RN  Safety Interventions: (virtual sitter)   aspiration precautions   bleeding precautions   chair alarm   bed alarm   low bed   other (comment)  Intervention: Prevent Skin Injury  Recent Flowsheet Documentation  Taken 10/11/2022 0800 by Leodis Liverpool, RN  Positioning for Skin: Supine/Back  Device Skin Pressure Protection:   absorbent pad utilized/changed   adhesive use limited   tubing/devices free from skin contact  Skin Protection:   adhesive use limited   cleansing with dimethicone incontinence wipes   incontinence pads utilized   mittens applied to hands   tubing/devices free from skin contact  Intervention: Prevent Infection  Recent Flowsheet Documentation  Taken 10/11/2022 0800 by Leodis Liverpool, RN  Infection Prevention:   environmental surveillance performed   equipment surfaces disinfected   hand hygiene promoted   personal protective equipment utilized   rest/sleep promoted   single patient room provided  Goal: Optimal Comfort and Wellbeing  Outcome: Progressing  Goal: Readiness for Transition of Care  Outcome: Progressing  Goal: Rounds/Family Conference  Outcome: Progressing     Problem: Infection  Goal: Absence of Infection Signs and Symptoms  Outcome: Progressing  Intervention: Prevent or Manage Infection  Recent Flowsheet Documentation  Taken 10/11/2022 0800 by Leodis Liverpool, RN  Infection Management: aseptic technique maintained  Isolation Precautions: enteric precautions maintained     Problem: Fall Injury Risk  Goal: Absence of Fall and Fall-Related Injury  Outcome: Progressing  Intervention: Promote Scientist, clinical (histocompatibility and immunogenetics) Documentation  Taken 10/11/2022 0900 by Leodis Liverpool, RN  Safety Interventions: family at bedside  Taken 10/11/2022 0800 by Leodis Liverpool, RN  Safety Interventions: Public affairs consultant)   aspiration precautions   bleeding precautions   chair alarm   bed alarm   low bed   other (comment)     Problem: Self-Care Deficit  Goal: Improved Ability to Complete Activities of Daily Living  Outcome: Progressing     Problem: Comorbidity Management  Goal: Maintenance of Asthma Control  Outcome: Progressing  Goal: Maintenance of Behavioral Health Symptom Control  Outcome: Progressing  Goal: Maintenance of COPD Symptom Control  Outcome: Progressing  Goal: Blood Glucose Levels Within Targeted Range  Outcome: Progressing  Goal: Maintenance of Heart Failure Symptom Control  Outcome: Progressing  Goal: Blood Pressure in Desired Range  Outcome: Progressing  Goal: Maintenance of Osteoarthritis Symptom Control  Outcome: Progressing  Intervention: Maintain Osteoarthritis Symptom Control  Recent Flowsheet Documentation  Taken 10/11/2022 0800 by Leodis Liverpool, RN  Activity Management: up ad lib  Goal: Bariatric Home Regimen Maintained  Outcome: Progressing  Goal: Maintenance of Seizure Control  Outcome: Progressing     Problem: Skin Injury Risk Increased  Goal: Skin Health and Integrity  Outcome: Progressing  Intervention: Optimize Skin Protection  Recent Flowsheet Documentation  Taken 10/11/2022 0800 by Leodis Liverpool, RN  Activity Management: up ad lib  Pressure Reduction Techniques:   frequent weight shift encouraged   heels elevated off bed  Pressure Reduction Devices:   alternating pressure pump (APP)   chair cushion utilized   pressure-redistributing mattress utilized  Skin Protection:   adhesive use limited   cleansing with dimethicone incontinence wipes   incontinence pads utilized   mittens applied to hands   tubing/devices free from skin contact

## 2022-10-11 NOTE — Unmapped (Signed)
OCCUPATIONAL THERAPY  Evaluation (10/11/22 1439)    Patient Name:  Julie [name]       Medical Record Number: [id number]   Date of Birth: [birth year]  Sex: Female            OT Treatment Diagnosis:  Pt presented with decreased activity tolerance, decreased safety awareness, irritability, elevated blood pressure, and strength deficits which impacted her ability to perform ADLs and functional t/fs safely and independently.    Assessment  Problem List: Decreased endurance, Decreased safety awareness, Decreased strength, Decreased cognition, Decreased activity tolerance, Impaired balance, Impaired ADLs, Fall risk, Decreased mobility  Personal Factors/Comorbidities (Occupational Profile and History Review): Expanded (Moderate)    Assessment: Julie [name] is a 72 y.o. female whose presentation is complicated by DDKT, HTN, hypothyroidism, T2DM that presented to [facility] with fevers, nausea and vomiting, diarrhea found to have E. Coli bacteremia. Course c/b SBO, fluid overload.    Per RN, pt needed to t/f to stretcher soon to go to a scan. Pt engaged in seated ADLs this date, but declined to practice t/fs. Pt presented with decreased activity tolerance, decreased safety awareness, irritability, elevated blood pressure, and strength deficits which impacted her ability to perform ADLs and functional t/fs safely and independently. Pt declined to t/f out of the chair at this time, stating that she did not want to go to the scan. Pt's daughter requested time alone to talk to her mother, so the OT evaluation was ended early. RN was made aware. Pt is appropriate for a recommendation of 5xL based on this limited evaluation, with anticipated progression to 3x when she engages in out of chair activities. After review of the patient's occupational profile and history, assessment of occupational performance, clinical decision making, and development of POC, the patient presents as a moderate complexity case.    Activity Tolerance During Today's Session  Tolerated treatment well, Limited by fatigue    Plan  Planned Frequency of Treatment:  1-2x per day for: 3-4x week  Planned Treatment Duration: 10/25/22    Planned Interventions:  Education (Patient/Family/Caregiver), Clinical research associate, Home Exercise Program, Manual Therapy, Therapeutic Activity, Therapeutic Exercise, Self-Care/Home Training      Post-Discharge Occupational Therapy Recommendations:   5x weekly, Low intensity (Anticipate 3x once able to assess pt's functional mobility/functional t/fs)     OT DME Recommendations: Defer to post acute, Tub seat with back, Bedside commode     GOALS:   Patient and Family Goals: None stated d/t irritability    Short Term:  SHORT GOAL #1: Pt will participate in an OOB assessment.   Time Frame : 3 weeks  SHORT GOAL #2: Pt will perform LB dressing mod I using LH AE as needed.   Time Frame : 3 weeks  SHORT GOAL #3: Pt will safely perform full body bathing mod I using LRAD as needed.   Time Frame : 3 weeks  SHORT GOAL #4: Pt will perform a grooming task mod I.   Time Frame : 3 weeks    Prognosis:  Fair  Positive Indicators:  PLOF, supportive family  Barriers to Discharge: Inability to safely perform ADLS, Endurance deficits, Decreased safety awareness, Functional strength deficits, Impaired Balance    Subjective  Prior Functional Status Per pt's daughter, the pt is typically independent for ADLs and functional mobility without AD. Pt typically cooks and cleans. Pt's spouse does the grocery shopping. Pt's daughter said she can be available to help intermittently.    Patient / Caregiver  reports: Why do I need to do that? pt asked re: UE ROM testing.    Past Medical History:   Diagnosis Date    Anemia     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)     since July 2017    Hypertension     Hypothyroidism (acquired)     Kidney transplant status, cadaveric 08/08/2022    Type 2 diabetes mellitus (CMS-HCC)     Social History     Tobacco Use Smoking status: Former     Current packs/day: 0.00     Average packs/day: 0.8 packs/day for 30.0 years (22.5 ttl pk-yrs)     Types: Cigarettes     Start date: 06/06/1976     Quit date: 06/06/2006     Years since quitting: 16.3    Smokeless tobacco: Former   Substance Use Topics    Alcohol use: Yes     Comment: only special occasions      Past Surgical History:   Procedure Laterality Date    CATARACT EXTRACTION      HYSTERECTOMY      OOPHORECTOMY      PERITONEAL CATHETER INSERTION      PR LAP INSERTION TUNNELED INTRAPERITONEAL CATHETER N/A 09/27/2018    Procedure: LAPAROSCOPY, SURGICAL; WITH INSERTION OF INTRAPERITONEAL CANNULA OR CATHETER, PERMANENT;  Surgeon: Leona Carry, MD;  Location: MAIN OR [place];  Service: Transplant    PR LAP INSERTION TUNNELED INTRAPERITONEAL CATHETER N/A 04/08/2019    Procedure: LAPAROSCOPY, SURGICAL; WITH INSERTION OF INTRAPERITONEAL CANNULA OR CATHETER, PERMANENT;  Surgeon: Leona Carry, MD;  Location: MAIN OR [facility];  Service: Transplant    PR LAP REVISE INTRAPERITONEAL CATHETER N/A 08/26/2019    Procedure: LAPAROSCOPY, SURGICAL; W/REVIS PREV PLACED INTRAPERITONEAL CANNULA/CATH, REMOV INTRALUMIN OBSTRUCT MATERIAL;  Surgeon: Leona Carry, MD;  Location: MAIN OR [facility];  Service: Transplant    PR REMOVAL TUNNELED INTRAPERITONEAL CATHETER N/A 05/06/2018    Procedure: REMOVAL OF PERMANENT INTRAPERITONEAL CANNULA OR CATHETER;  Surgeon: Leona Carry, MD;  Location: MAIN OR [facility];  Service: Transplant    PR REMOVE PERITONEAL FOREIGN BODY N/A 04/08/2019    Procedure: Removal Of Peritoneal Of Foreign Body From Peritoneal Cavity;  Surgeon: Leona Carry, MD;  Location: MAIN OR [facility];  Service: Transplant    PR TRANSPLANT,PREP RENAL GRAFT/ARTERIAL Right 08/06/2022    Procedure: [facility] RECONSTRUCTION CADAVER/LIVING DONOR RENAL ALLOGRAFT PRIOR TO TRANSPLANT; ARTERIAL ANASTOMOSIS EAC;  Surgeon: Toledo, Lilyan Punt, MD;  Location: MAIN OR [facility]; Service: Transplant    PR TRANSPLANTATION OF KIDNEY Right 08/06/2022    Procedure: RENAL ALLOTRANSPLANTATION, IMPLANTATION OF GRAFT; WITHOUT RECIPIENT NEPHRECTOMY;  Surgeon: Toledo, Lilyan Punt, MD;  Location: MAIN OR [facility];  Service: Transplant    TUBAL LIGATION      Family History   Problem Relation Age of Onset    Diabetes Mother     Alzheimer's disease Father     Diabetes Sister     Diabetes Sister     Diabetes Sister     Anesthesia problems Neg Hx     Bleeding Disorder Neg Hx         Oxycodone and Valacyclovir     Objective Findings  Precautions / Restrictions  Falls precautions, Isolation precautions (Enteric)     Weight Bearing  Non-applicable    Required Braces or Orthoses  Non-applicable    Communication Preference  Verbal, Visual    Pain  Pt denied pain, all activities were adjusted per pt's tolerance.    Equipment / Environment  Vascular access (PIV, TLC, Port-a-cath, PICC), Supplemental oxygen, Telemetry, NGT    Living Situation  Living Environment: House  Lives With: Spouse  Home Living: One level home, Stairs to enter with rails, Tub/shower unit, Standard height toilet, Shower chair with back  Rail placement (outside): Rail on right side  Number of Stairs to Enter (outside): 3  Caregiver Identified?: Yes  Caregiver Availability: Intermittent  Caregiver Ability: Limited lifting  Equipment available at home: Agricultural consultant, Paediatric nurse with back     Cognition   Orientation Level:  Oriented x 4   Arousal/Alertness:  Appropriate responses to stimuli   Attention Span:  Difficulty dividing attention   Memory:  Decreased short term memory (Per pt's daughter she struggles with most short term memory and some long term memory)   Following Commands:  Follows one step commands with increased time   Safety Judgment:  Decreased awareness of need for safety   Awareness of Errors and Problem Solving:  Assistance required to identify errors made, Assistance required to generate solutions, Assistance required to implement solutions    Vision / Hearing   Vision: Wears glasses for reading only  Hearing: Hearing aids present     Hand Function:  Right Hand Function: Right hand function impaired  Right Hand Impairment: grip strength fair  Left Hand Function: Left hand function impaired  Left Hand Impairment: grip strength fair    Skin Inspection:  Skin Inspection: Intact where visualized    ROM / Strength:  UE ROM/Strength: Right Impaired/Limited, Left Impaired/Limited  RUE Impairment: Reduced strength  LUE Impairment: Reduced strength  UE ROM/ Strength Comment: Pt was irritable about having to attempt ROM/strength testing.    Sensation:  RUE Sensation: RUE intact  LUE Sensation: LUE intact  RLE Sensation: RLE intact  LLE Sensation: LLE intact    Balance:  Static Sitting-Level of Assistance: Supervision  Dynamic Sitting-Level of Assistance: Supervision    Static Standing-Level of Assistance:  (NT)  Dynamic Standing - Level of Assistance:  (NT)    Functional Mobility  Transfers:  (Pt declined to stand at this time)  Bed Mobility - Needs Assistance:  (NT)  Ambulation: NT    ADLs  ADLs: Needs assistance with ADLs  ADLs - Needs Assistance: Feeding, Grooming, Bathing, Toileting, UB dressing, LB dressing  Feeding - Needs Assistance: Set Up Assist, Supervision  Grooming - Needs Assistance: Set Up Assist, Contact Guard assist (To wipe face seated in chair, with tactile assisst to ensure she does not pull out her NGT)  Bathing - Needs Assistance: Mod assist  Toileting - Needs Assistance: Mod assist  UB Dressing - Needs Assistance: Min assist (To adjust hospital gown seated in chair)  LB Dressing - Needs Assistance: Min assist (To don slip on shoes seated in chair)  IADLs: NT    Vitals / Orthostatics  Vitals/Orthostatics: BP was 192/36 (95) seated in the chair. RN was made aware.    Patient at end of session: All needs in reach, In chair, Friends/Family present, Lines intact, Nurse notified     Occupational Therapy Session Duration  OT Individual [mins]: 12       I attest that I have reviewed the above information.  Signed: Eben Burow, OT  Filed 10/11/2022

## 2022-10-11 NOTE — Unmapped (Signed)
IMMUNOCOMPROMISED HOST INFECTIOUS DISEASE CONSULT NOTE    Julie [name] is being seen in consultation at the request of Johnsie Cancel, MD for evaluation of UTI in transplant patient.    Assessment/Recommendations:    Julie [name] is a 72 y.o. 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 Had perinephric drain in place postoperatively, removed 08/30/22  - Immunosuppression: Tacrolimus (goal 7-9), MM 500F, Pred 5  - Prophylaxis: valganciclovir x 6 months (mod risk), TMP+SMX x 6 months     Pertinent comorbidities:  ESRD was on PD and HD  DM II (A1c 5.6 on 08/06/22)  CAD  HLD  SBO 10/07/22, managing with supportive care     Pertinent exposures:  Originally from [place]   Treated with ivermectin at the ID clinic in 2018.     Summary of pertinent prior infections:  #History of Shingles 2017  #04/2018 Dialysis fluid [place] R: Ampicillin, Unasyn, Cefazolin,S: Ceftriaxone, Gentamicin, Levo, PipTazo, Tobra  #Donor urine cx with C. Dubliensis 08/05/22, <10K, negative recipient samples, negative donor blood cx, treated with 2 weeks PO fluconazole  #E. Coli bacteremia and UTI 08/21/22  - Ucx/Bcx 1/29 E. Coli, R amp, amp/sulb, TMP/SMX. Susc CFZ, cipro/levo, gent/tobra, nitrofurantoin, tetra  - Treated with vanc/CFP -> 3 weeks levofloxacin through 2/26 (ureteral stent removed 2/15)     Active infection:  # ESBL/FQ-R E. coli bacteremia and UTI 10/04/22, improving  # Perinephric fluid collection 10/05/22, re-accumulated after JP drain removal 08/30/22  - 3/12 developed fever and malaise at home, per family possible poor PO intake, diarrhea as well  - 3/14 Bcx 2/2 E. Coli, susc-dose-dependent CFP, R cipro/levo, susc erta/mero, tobra, gent (R TMP/SMX, tetra, susc nitrofurantoin, mino)   - 3/16 Bcx x 2 NGTD  - 3/14 UA 26 WBC, Ucx 10-50K E. Coli (no AST) - fosfomycin susc pending  - 3/14 Imaging:   Renal US 8.1 x 0.8 x 2.3 cm perinephric collection new from prior, considering small hematoma, complicated seroma, or less likely abscess  CT A/P without contrast: perinephric stranding, 5.3 x 2.0 cm ill-defined perinephric collection limited evaluation without contrast. No stones seen. 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 Outpatient referral to urogynecology given recurrent UTIs - can assist in evaluation for retention / urologic abnormalities that could be contributory    Management  For E. Coli  UTI/bacteremia  Continue meropenem, renally dosed equivalent of 1g q8h    For thrush and presumed candida esophagitis  Continue IV micafungin 150 mg IV daily (dosed for esophagitis)    Antimicrobial prophylaxis required for host deficiency: transplant immunosuppression  Viral: Valganciclovir  PJP: TMP/SMX MWF    Intensive toxicity monitoring for prescription antimicrobials   CBC w/diff at least once per week  CMP at least once per week  clinical assessments for rashes or other skin changes    The [facility] ID service will continue to follow.           Please page the ID Transplant/Liquid Oncology Fellow consult at [phone] with questions.  Patient discussed with Dr. Raylene Miyamoto.    Venita Sheffield, MD  [facility]    History of Present Illness:      External record(s): Primary team note: NGT with high output due to SBO Procedure/op note(s) US guided aspiration of fluid along inferior aspect of LLQ transplanted kidney-50 ml serosanguinous fluid. NGT to [facility] for SBO. .    Independent historian(s): Family member reports patient had two episodes of bright bloody drainage from NG tube this morning and believe she had an episode of hematuria yesterday .       Interval hx:  No events overnight, remains afebrile. US guided aspiration of fluid along inferior aspect of LLQ transplanted kidney-50 ml serosanguinous fluid. NGT to [facility] for SBO. Received 1 unit pRBCs this AM. Daughters and husband at bedside. Pt reports her abdominal pain has resolved, but she had emesis and abdominal pain yesterday, which she attributes to taking medications without food. Had a bowel movement yesterday.      Vital Signs last 24 hours:  Temp:  [36.3 ??C (97.3 ??F)-36.8 ??C (98.3 ??F)] 36.6 ??C (97.9 ??F)  Heart Rate:  [63-83] 83  SpO2 Pulse:  [63-75] 69  Resp:  [14-29] 29  BP: (142-219)/(24-51) 194/38  MAP (mmHg):  [71-109] 95  A BP-1: (210)/(45) 210/45  SpO2:  [91 %-100 %] 95 %    Physical Exam:  Patient Lines/Drains/Airways Status       Active Active Lines, Drains, & Airways       Name Placement date Placement time Site Days    NG/OG Tube Decompression 16 Fr. Right nostril 10/07/22  1300  Right nostril  3    Peripheral IV 10/10/22 Anterior;Proximal;Right Forearm 10/10/22  1943  Forearm  less than 1    Arteriovenous Fistula - Vein Graft  Access 08/24/16 Left;Upper Arm 08/24/16  --  Arm  2239                    Const [x]  vital signs above    []  NAD, non-toxic appearance []  Chronically ill-appearing, non-distressed  Sitting in chair, awake/alert in no distress      Eyes [x]  Lids normal bilaterally, conjunctiva anicteric and noninjected OU     [] PERRL  [] EOMI        ENMT [x]  Normal appearance of external nose and ears, no nasal discharge        []  MMM, no lesions on lips or gums []  No thrush, leukoplakia, oral lesions  []  Dentition good []  Edentulous []  Dental caries present  []  Hearing normal  []  TMs with good light reflexes bilaterally   White patches on roof of mouth c/w thrush  NGT in place, on low intermittent suction with nearly black output in canister, light pink fluid in tubing      Neck [  x] Neck of normal appearance and trachea midline        []  No thyromegaly, nodules, or tenderness   []  Full neck ROM        Lymph []  No LAD in neck     []  No LAD in supraclavicular area     []  No LAD in axillae   []  No LAD in epitrochlear chains     []  No LAD in inguinal areas        CV [x]  RRR            []  No peripheral edema     []  Pedal pulses intact   []  No abnormal heart sounds appreciated   []  Extremities WWP   III/VI murmur LUSB      Resp []  Normal WOB at rest    []  No breathlessness with speaking, no coughing  []  CTA anteriorly    []  CTA posteriorly    Breathing comfortably      GI [x]  Normal inspection, NTND   []  NABS     []  No umbilical hernia on exam       []  No hepatosplenomegaly     []  Inspection of perineal and perianal areas normal  Moderately distended but nontender to palpation      GU []  Normal external genitalia     [] No urinary catheter present in urethra   []  No CVA tenderness    []  No tenderness over renal allograft        MSK []  No clubbing or cyanosis of hands       []  No vertebral point tenderness  []  No focal tenderness or abnormalities on palpation of joints in RUE, LUE, RLE, or LLE        Skin [x]  No rashes, lesions, or ulcers of visualized skin     []  Skin warm and dry to palpation   LUE prior graft in place with thrill      Neuro [x]  Face expression symmetric  []  Sensation to light touch grossly intact throughout    [x]  Moves extremities equally    []  No tremor noted        []  CNs II-XII grossly intact     []  DTRs normal and symmetric throughout []  Gait unremarkable        Psych []  Appropriate affect       []  Fluent speech         []  Attentive, good eye contact  []  Oriented to person, place, time          []  Judgment and insight are appropriate   Alert, speech normal        Data for Medical Decision Making     (3/14) EKG QTcF 438    I discussed micro and/or path w/lab personnel regarding presence of K pneumoniae in OPF from bronchial cx .    I reviewed micro result(s) (yeast screen pending. Culture of perinephric abscess pending-GS w/ PMNs, no organisms) and radiology report(s) (KUB-dilated loops of small bowel).    I independently visualized/interpreted not done.       Recent Labs   Lab Units 10/10/22  0936 10/07/22  0622 10/06/22  0419 10/06/22  0242 10/05/22  1110   WBC 10*9/L 2.8*   < > 8.9   < > 10.6   HEMOGLOBIN g/dL 6.9*   < > 7.6*   < > 7.8*   PLATELET COUNT (1) 10*9/L 71*   < > 51*   < > 64*  NEUTRO ABS 10*9/L  --   --   --   --  10.1*   LYMPHO ABS 10*9/L  --   --   --   --  0.3*   EOSINO ABS 10*9/L  --   --   --   --  0.0   BUN mg/dL 41*   < > 45* - 45*   < > 48*   CREATININE mg/dL 0.98*   < > 1.19* - 1.47*   < > 1.79*   AST U/L  --   --  20   < > 21   ALT U/L  --   --  17   < > 19   BILIRUBIN TOTAL mg/dL  --   --  0.5   < > 0.4   ALK PHOS U/L  --   --  50   < > 43*   POTASSIUM WHOLE BLOOD   --    < > 4.9*   < >  --    POTASSIUM mmol/L 4.4   < > 4.9* - 4.9*   < > 5.4*   MAGNESIUM mg/dL 1.7   < > 1.5*   < > 1.7   PHOSPHORUS mg/dL 2.5   < >  --   --  2.2*   CALCIUM mg/dL 7.7*   < > 8.3* - 8.3*   < > 8.2*    < > = values in this interval not displayed.       Lab Results   Component Value Date    Tacrolimus, Trough 11.5 10/10/2022       Microbiology:  Microbiology Results (last day)       Procedure Component Value Date/Time Date/Time    Blood Culture #2 [[id number]]  (Normal) Collected: 10/07/22 0624    Lab Status: Preliminary result Specimen: Blood from 1 Peripheral Draw Updated: 10/11/22 0645     Blood Culture, Routine No Growth at 4 days    Blood Culture #1 [[id number]]  (Normal) Collected: 10/07/22 0620    Lab Status: Preliminary result Specimen: Blood from 1 Peripheral Draw Updated: 10/11/22 0645     Blood Culture, Routine No Growth at 4 days    Creatinine, body fluid [[id number]] Collected: 10/10/22 1118    Lab Status: Final result Specimen: Fluid, Peritoneal from Other-enter as order comment Updated: 10/10/22 1934     Creat, Fluid 1.5 mg/dL      Comment: Fluid creatinine to blood creatinine ratios >1.0 suggest the presence of urine.        Creat, Fluid Type Fluid, Peritoneal    Narrative:      The reference range for this body fluid has not been established. The test result must be integrated into the clinical context for interpretation.  This test was developed and its performance characteristics determined by the [facility] of the [employer] [employer]. This test has not been cleared or approved by the FDA. The laboratory is regulated under CAP and CLIA as qualified to perform high-complexity testing. This test is to be used for clinical purposes and should not be regarded as investigational or for research.    Micro Add-on [[id number]] Collected: 10/05/22 1002    Lab Status: Preliminary result Specimen: Urine from Clean Catch Updated: 10/10/22 1814    Yeast Screen [[id number]] Collected: 10/10/22 1510    Lab Status: In process Specimen: Throat Updated: 10/10/22 1534    Aerobic/Anaerobic Culture [[id number]] Collected: 10/10/22 1118    Lab Status: Preliminary result Specimen: Aspirate from  Kidney, Left Updated: 10/10/22 1356     Gram Stain Result 1+ Polymorphonuclear leukocytes      No organisms seen    Narrative:      Specimen Source: Kidney, Left            Imaging:  IR Aspiration Hematoma Abscess    Result Date: 10/11/2022  EXAM: US-GUIDED PERCUTANEOUS ASPIRATION OF A FLUID COLLECTION DATE: 10/10/2022 11:38 AM ACCESSION: [id number] UN DICTATED: 10/10/2022 4:15 PM INTERPRETATION LOCATION: [facility]     CLINICAL INDICATION: 72 years old Female: perinephric fluid collection, transplanted kidney     COMPARISON: CT abdomen/pelvis  CONSENT: Informed consent was obtained from the patient including a discussion of the alternatives, benefits, and risks including but not limited to infection, bleeding, need for additional procedure, and/or ICU admission.  PROCEDURE: The procedure was performed with the patient supine . Limited ultrasound images were obtained, demonstrating ill-defined fluid along the inferior margin of the transplant kidney. These images were saved and sent to PACS. A skin entry site was identified and was prepped and draped using all elements of maximal sterile barrier technique. Local anesthesia was achieved with lidocaine .  A 15 cm 18-G needle was inserted into the collection using ultrasound guidance. After entry into the fluid collection, the needle was aspirated. Aspiration of the needle returned 50 mL of serosanguineous fluid, and a specimen was sent for culture.  SEDATION: I personally spent 22 minutes, continuously monitoring the patient face-to-face during the administration of moderate sedation.  Radiology nurse was present for the duration of the procedure to assist in patient monitoring.  Pre and Post Sedation activities have been reviewed.          Successful ultrasound-guided aspiration of fluid along the inferior aspect of the left lower quadrant transplant kidney.     Attestation     Signer name: Benjamine Mola, MD     I attest that I was present for the procedure ????        ???? I reviewed the stored images and agree with the report as written.    XR Abdomen 1 View    Result Date: 10/11/2022  EXAM: XR ABDOMEN 1 VIEW ACCESSION: [id number] UN     CLINICAL INDICATION: 72 years old with ABDOMINAL PAIN  -  UNSPECIFIED SITE      COMPARISON: KUB 10/11/2022 and prior. TECHNIQUE: Portable supine views of the abdomen, 2 images(s)     FINDINGS: Nonweighted enteric tube courses below the diaphragm with tip and sideport projecting over the expected region of the stomach.     Multiple loops of small bowel are opacified with oral contrast similar to prior. Degree of small bowel dilation is unchanged from prior. No contrast is seen within the colon. Pelvic surgical clips in this patient with history of left renal transplant. Pelvic phleboliths.     Similar bibasilar atelectasis.         --Redemonstration of multiple similarly dilated loops of small bowel with intraluminal contrast which is largely unchanged from prior. No contrast is demonstrated within colon at the time of examination. --Appropriate positioning of nonweighted enteric tube.         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     TECHNIQUE: Supine view of the abdomen, 2 images(s)     FINDINGS: Unchanged  position of esophagogastric tube. The tip and sideport are coiled projecting over the gastric bubble. Unchanged small bowel dilation. Calcifications projecting over the bilateral renal fossa. Pelvic surgical clips.         --Unchanged position of esophagogastric tube with tip and sideport coiled over the gastric bubble.     -- Stable degree of dilated small bowel.         XR Abdomen 1 View    Result Date: 10/11/2022  EXAM: XR ABDOMEN 1 VIEW ACCESSION: [id number] UN     CLINICAL INDICATION: 72 years old with FOREIGN BODY DIGESTIVE SYSTEM      COMPARISON: KUB yesterday.     TECHNIQUE: Supine view of the abdomen, 2 images(s)     FINDINGS: Interval advancement of esophagogastric tube. The tip and sideport project over the left upper quadrant and is slightly coiled.     Multiple loops of small bowel are opacified with oral contrast. The degree of dilation is similar to yesterday. There is no contrast opacifying the colon at the time of this exam. Pelvic surgical clips. Pelvic phleboliths. Bibasilar atelectasis.         --Multiple loops of small bowel are dilated similar in degree to yesterday, oral contrast does not opacify the colon at the time of this exam.     --Interval advancement of esophagogastric tube, projecting over the stomach.         XR Abdomen 1 View    Result Date: 10/10/2022  EXAM: XR ABDOMEN 1 VIEW ACCESSION: [id number] UN     CLINICAL INDICATION: 72 years old with NGT (CATHETER VASCULAR FIT & ADJ)      COMPARISON: KUB earlier today.     TECHNIQUE: Supine view of the abdomen, 1 images(s)     FINDINGS: Interval retraction of esophagogastric tube, the tip and sideport coiled over the gastric fundus. Similar degree of small bowel dilation. Calcifications overlying the bilateral renal fossa could represent vascular calcifications or renal calculi. Pelvic surgical loops.         Interval retraction of esophagogastric tube, curled over the gastric fundus. Stable mildly dilated loops of small bowel         XR Abdomen Portable    Result Date: 10/10/2022  EXAM: XR ABDOMEN PORTABLE ACCESSION: [id number] UN     CLINICAL INDICATION: 72 years old with Gastrograffin challenge ; OTHER      COMPARISON: CT abdomen/pelvis 10/07/2022     TECHNIQUE: Supine view of the abdomen, 2 images(s)     FINDINGS: Esophagogastric tube is coiled within the stomach and the tip and sideport project over the left upper quadrant/gastric fundus, recommend retraction by 10 cm. Dilated loops of small bowel measuring up to 4.9 cm,, similar compared with prior CT examination/nonobstructive. Calcifications projecting over the bilateral renal fossa may represent vascular calcifications or calculi. Pelvic surgical clips. Vascular calcifications.         Esophagogastric tube coiled in the stomach with tip pointing retrograde, consider retraction by approximately 10 cm for more optimal positioning.     Persistent dilated small bowel, consistent with suspected small bowel obstruction.         US Renal Transplant Limited    Result Date: 10/08/2022  EXAM: US RENAL TRANSPLANT LIMITED ACCESSION: [id number] UN     CLINICAL INDICATION: evaluation of perinephric fluid collection     COMPARISON: Renal transplant ultrasound 10/05/2022, CT abdomen pelvis 10/07/2022     TECHNIQUE:  Static and cine images of the renal transplant and urinary bladder were obtained.  FINDINGS: TRANSPLANTED KIDNEY: The renal transplant was located in the left lower quadrant. Normal size and echogenicity.  No solid masses or calculi. No perinephric collections identified. No hydronephrosis.      Renal transplant:   12.4  x  4.6   x 6.1 cm     Redemonstrated mildly complex perinephric fluid collection measuring 8.1 x 0.9 x 1.6 cm. New upper pole mildly complex perinephric collection with possible communication with the dominant collection. The upper portion of the collection measures 2.4 x 2.2 x 0.8 cm     BLADDER: Unremarkable.      Bladder volume: 190   mL     OTHER: Trace free fluid in the lower abdomen. Loop of dilated fluid-filled small bowel adjacent to the transplant kidney measuring up to 3.3 cm in diameter.         Complex perinephric fluid collection measuring up to 8.1 cm suggestive of persistent hematoma. A new but similar complex collection adjacent to the upper pole of the transplant kidney measures up to 2.2 cm and possibly communicates with the dominant portion of the collection.     Mildly dilated loop of fluid-filled small bowel adjacent to the transplant kidney, similar to recent CT.

## 2022-10-11 NOTE — Unmapped (Incomplete)
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 found to have E coli bacteremia from a urinary source. she developed ileus on hospital day 2 (3/16).     kidney transplant 08/06/22   baseline creatinine 1.4-1.6 mg/dl. mild non oliguric AKI on admission resolved.  maintenance iv fluids while on NGT suction and unable to tolerate po intake    immunosuppression [High risk medical decision making for drug therapy requiring intensive monitoring for toxicity]  sublingual tacrolimus 1.5mg /1mg  am/pm. 12hr tacrolimus goal 7-9 ng/ml.   - monitor tacrolimus level with am labs. 3/20 tacrolimus level is 24hr level d/t missed dose 3/19 pm.  mycophenolate 500mg  bid  methylprednisolone 4mg  daily    perinephric transplant fluid collection  ir fluid aspiration 3/19 with no microorganisms noted, fluid cultures no growth to date    E.coli bacteremia from complicated urinary tract infection +/-transplant pyelonephritis  meropenem 3/14-present x anticipated 2 week antibiotic course.  plan outpatient referral to urogynecology for evaluation and mgmt recurrent urine tract infection    small bowel obstruction  NGT to low intermittent suction. serial imaging and abdominal exam. abdomen/pelvis ct with iv contrast pending.  general surgery consult appreciated.    hypertension  isolated systolic hypertension with wide pulse pressure and low diastolic readings make mgmt challenging without causing lightheadedness and increasing risk for falls, organ hypoperfusion.    clonidine taper to 0.1mg  q.pm x3d then discontinuation encouraged as tolerated d/t fatigue and sedation associated with this medication    hydralazine 25mg  bid; this can be increased towards home dosing 100mg  bid as needed    infectious prophylaxis and monitoring:   CMV D+/R+, EBV D+/R+  valganciclovir 450mg  daily x73m 1-4/24   trimethoprim/sulfamethoxazole 80/400mg  3x/week x57m 1-7/24  micafungin 150mg  daily vs oral +/-esophageal candidiasis    Prince Rome, MD  Division of Nephrology and Hypertension  [facility]  10/11/2022  _______________________________________________________________________________    Kidney Transplant History:   Date of Transplant: 08/06/2022 (Kidney)  Type of Transplant: DCD  KDPI: 56%  Cold ischemic time: 877 minutes (14 hr 37 min)  Warm ischemic time: 32 minutes  cPRA: 0%  HLA match:   Blood type: Donor A1, Recipient A POS  ID: CMV D+/R+, EBV D+/R+  Native Kidney Disease: Diabetes              Native kidney biopsy: Diabetic nephropathy (done elsewhere)              Pre-transplant dialysis course: PD started July 2017, transitioned to HD October 2019 due to catheter dysfunction/inadequate dialysis  Prior Transplants: None  Induction: Basiliximab  Early steroid withdrawal: No    subjective: s/p IR aspiration perinephric fluid collection 3/19. gastrografin challenge with failure of contrast to advance to large bowel. +high volume NGT suction. abdomen distension +/-improved. throat discomfort with NGT improved. no headache. no chest pain orthopnea or shortness of breath.  bowel mvmt x1. no urine symptoms.    medications:   Current Facility-Administered Medications   Medication Dose Route Frequency    amlodipine (NORVASC) tablet 5 mg  5 mg Oral Q12H SCH    atorvastatin (LIPITOR) tablet 10 mg  10 mg Oral Q24H SCH    calcium carbonate (TUMS) chewable tablet 200 mg of elem calcium  200 mg of elem calcium Oral TID PRN  carvedilol (COREG) tablet 25 mg  25 mg Oral Q12H SCH    cloNIDine HCL (CATAPRES) tablet 0.1 mg  0.1 mg Oral Nightly    dextrose (D10W) 10% bolus 125 mL  12.5 g Intravenous Q10 Min PRN    glucagon injection 1 mg  1 mg Intramuscular Once PRN    glucose chewable tablet 16 g  16 g Oral Q10 Min PRN    guaiFENesin (ROBITUSSIN) oral syrup  200 mg Oral Q4H PRN    heparin (porcine) 5,000 unit/mL injection 5,000 Units  5,000 Units Subcutaneous Q8H SCH    hydrALAZINE (APRESOLINE) tablet 25 mg  25 mg Oral BID    HYDROmorphone (PF) injection Syrg 0.25 mg  0.25 mg Intravenous Q4H PRN    insulin glargine (LANTUS) injection 6 Units  6 Units Subcutaneous Nightly    insulin lispro (HumaLOG) injection 0-20 Units  0-20 Units Subcutaneous ACHS    labetalol (NORMODYNE) injection  20 mg Intravenous Q4H PRN    levothyroxine (SYNTHROID) tablet 112 mcg  112 mcg Oral daily    magnesium oxide-Mg AA chelate (Magnesium Plus Protein) 2 tablet  2 tablet Oral BID    melatonin tablet 3 mg  3 mg Oral Nightly PRN    meropenem (MERREM) 1 g in sodium chloride 0.9 % (NS) 100 mL IVPB-MBP  1 g Intravenous Q12H    methylPREDNISolone sodium succinate (PF) (SOLU-Medrol) injection 5.2 mg  5.2 mg Intravenous Q24H    micafungin (MYCAMINE) 150 mg in sodium chloride (NS) 0.9 % 100 mL IVPB  150 mg Intravenous Q24H    mycophenolate (CELLCEPT) 500 mg in dextrose 5 % 100 mL IVPB  500 mg Intravenous Q12H    ondansetron (ZOFRAN-ODT) disintegrating tablet 4 mg  4 mg Oral Q8H PRN    Or    ondansetron (ZOFRAN) injection 4 mg  4 mg Intravenous Q8H PRN    pantoprazole (Protonix) EC tablet 40 mg  40 mg Oral Daily    phenol (CHLORASEPTIC) 1.4 % spray 2 spray  2 spray Mucous Membrane Q2H PRN    prochlorperazine (COMPAZINE) injection 10 mg  10 mg Intramuscular Q8H PRN    simethicone (MYLICON) chewable tablet 80 mg  80 mg Oral Q6H PRN    sodium chloride (NS) 0.9 % infusion  75 mL/hr Intravenous Continuous    sulfamethoxazole-trimethoprim (BACTRIM) 400-80 mg tablet 80 mg of trimethoprim  1 tablet Oral Once per day on Monday Wednesday Friday    tacrolimus (PROGRAF) 1.5mg  combo product  1.5 mg Sublingual Daily    tacrolimus (PROGRAF) capsule 1 mg  1 mg Sublingual Nightly    valGANciclovir (VALCYTE) tablet 450 mg  450 mg Oral Every other day      ALLERGIES  Oxycodone and Valacyclovir      Physical Exam:  Vitals:    10/11/22 1119   BP: 194/38   Pulse: 83   Resp: 29   Temp: 36.6 ??C (97.9 ??F)   SpO2: 95%     I/O this shift:  In: 715 [Blood:475; NG/GT:240]  Out: 950 [Urine:350; Emesis/NG output:600]    Intake/Output Summary (Last 24 hours) at 10/11/2022 1156  Last data filed at 10/11/2022 1119  Gross per 24 hour   Intake 715 ml   Output 2050 ml   Net -1335 ml     general: resting comfortably in recliner chair no acute distress. NGT in place.  HEENT: anicteric sclera  cv: RRR nl s1s2 +II/VI systolic murmur no s3/s4. no peripheral edema  lungs: no increased work of breathing,  [place] oxygen in place  abdomen: distended, uncomfortable but not tender; bowel sounds absent.  skin: no visible lesions or rashes    Test Results  Lab Results   Component Value Date    WBC 3.7 (L) 10/11/2022    HGB 10.2 (L) 10/11/2022    HCT 29.4 (L) 10/11/2022    PLT 111 (L) 10/11/2022       Lab Results   Component Value Date    NA 138 10/11/2022    K 4.9 10/11/2022    CL 106 10/11/2022    CO2 26.0 10/11/2022    BUN 51 (H) 10/11/2022    CREATININE 1.52 (H) 10/11/2022    GLU 160 (H) 10/11/2022    CALCIUM 9.4 (L) 10/11/2022    MG 2.2 10/11/2022    PHOS 2.9 10/11/2022     lactate 1.2    tacrolimus 24hr lvl 5.4 ng/ml ALBUMIN 2.6 (L) 10/10/2022    PHOS 2.5 10/10/2022

## 2022-10-11 NOTE — Unmapped (Signed)
VENOUS ACCESS ULTRASOUND PROCEDURE NOTE    Indications:   Poor venous access.    The Venous Access Team has assessed this patient for the placement of a PIV. Ultrasound guidance was necessary to obtain access.     Procedure Details:  Identity of the patient was confirmed via name, medical record number and date of birth. The availability of the correct equipment was verified.    The vein was identified for ultrasound catheter insertion.  Field was prepared with necessary supplies and equipment.  Probe cover and sterile gel utilized.  Insertion site was prepped with chlorhexidine solution and allowed to dry.  The catheter extension was primed with normal saline. A(n) 22 gauge 1.75" catheter was placed in the R Forearm with 1attempt(s). See LDA for additional details.    Catheter aspirated, 3 mL blood return present. The catheter was then flushed with 10 mL of normal saline. Insertion site cleansed, and dressing applied per manufacturer guidelines. The catheter was inserted with difficulty due to poor vasculature by Shonnie Poudrier Joy M [place] Festa, RN.      RN was notified.     Thank you,     Aleenah Homen Joy M [place], RN Venous Access Team   [phone]     Workup / Procedure Time:  30 minutes    See images below:

## 2022-10-11 NOTE — Unmapped (Signed)
Internal Medicine (MEDW) Progress Note    Assessment & Plan:   Julie [name] is a 72 y.o. female whose presentation is complicated by DDKT, HTN, hypothyroidism, T2DM that presented to [facility] with fevers, nausea and vomiting, diarrhea found to have E. Coli bacteremia. Course c/b SBO, fluid overload.     Active Problems  E. Coli bacteremia, bacteruria iso immunocompromised status - Perinephric fluid collection - Fever, N/V/D- resolving  Patient presenting with 48-hour history of fevers, febrile in ED 100.4, 24-hour history of nausea/vomiting and diarrhea.  No leukocytosis on labs, though WBC is elevated from baseline (10.6 3/14 usually ~5-7) patient does have left shift with neutrophils of 9.7.  Unclear etiology most likely gastroenteritis given acute nausea vomiting diarrhea, could also be secondary to gram-negative bacteremia.  Lower suspicion of respiratory, urinary source given patient is asymptomatic. CMV, EMV (-). Bcx, Ucx (+) E.coli. UA 3/14 positive for leukocyte esterase and WBC. CTAP 3/14 showed perinephric collection consistent with previous renal US and c/f renal infection, possibly pyelonephritis was not definitive given lack of contrast. Repeat ultrasound TXP kidney shows 8.1cm hematoma, possibly communicating 2.2cm complex collection on upper pole of kidney. S/p VIR aspiration 3/19. Creatinine level of fluid collection is 1.5, potentially suggestive of a urinoma.    -ID consulted, appreciate assistance   -C diff (-), GIPP needs to be collected  -Continue meropenem  -3/16 Bcx NGTD    SBO   Evening 3/15 new onset abd pain, distention. Exam (-) peritonitis, (+) BS. KUB shows increasing small bowel dilatation up to 4.2 cm with relative decompression of the colon. CTAP 3/14 showed no evidence of SBO or inflammation of GI tract. Repeat CXR 3/16 suggests ileus related to left lower quadrant transplant. CTAP 3/16 shows concern for developing high grade small bowel obstruction d/t adhesions. Unable to assess for ischemia given lack of IV contrast. General surgery consulted, placed NGT with LIWS. Patient is NPO. No acute surgical intervention needed at this time. Serial lactates have been wnl. Continues to have abdominal distention, high NGT output, appears to have failed gastrografin challenge. Continuing LIWS, awaiting surgery recs.   - IV dilaudid 0.25mg  q4 PRN  - IV tylenol 1g q6 PRN  - NGT to LIWS  - PRN simethicone    DDKT in 2024 - SOB, volume overload  DCD, KDPI 56%, Basiliximab induction, CMV +/+, EBV +/+, standard anatomy. Post kidney transplant course has been notable for: E coli UTI.  Renal US 3/14 showed small new perinephric collection which may represent hematoma, complicated seroma, or abscess. Stable resistive indices in the renal transplant arteries, which are mildly elevated. Course c/b rapid response x2 for SOB, found to pulmonary edema and atelectasis. Has wide pulse pressure and refractory HTN controlled w/ several medications. Sensitive to volume shifts likely due to graft, possibly aortic stenosis, and current bacteremia.   -S/p IV lasix 80mg  x2  -Would gently diurese if desats  - Continue 75cc/hr NS  -Continue bactrim for PJP prophylaxis  -Continue valganciclovir for CMV prophylaxis  -Continue sublingual tacrolimus, PTD  -Continue IV cellcept 250mg  BID  -Continue IV methylprednisolone 5mg  daily  - Immunosuppression/prophylaxis stated above  -Transplant medicine and nephrology consulted, appreciate assistance    HTN  Systolics ranging from 150-200s. At home, HTN 160-170 with home medications. Reassuring in a sense that pt is not hypotensive from bacteremia. Working w/ txp nephrology in titrating medications.   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 Hgb 7.6 3/5 from 8.2 at admission. No bleeding noted. Platelets now 51 from 51 3/14. CTAP 3/14 showed hyperattenuation of the left ventricular myocardium suggestive of anemia. Iron panel 3/16 showed Fe 34, TIBC 173, and Fe sat of 20. Will defer iron repletion at this time. Hb 6.9 3/19, however likely spurious. Awaiting 3/20 labs.   -S/p 1u pRBC  -Daily CBC    T2DM  Last A1C 5.6.  Home regimen Lantus 12 units nightly, lispro 3 units with meals, with sliding scale.  Will decrease insulin regimen given poor p.o. intake. Glucose 668 on 3/19, likely spurious iso lab drawn while dextrose containing fluids were running.  -Lantus 6 units nightly  -SSI  -Hold mealtime insulin    C/f thrush, candida esophagitis  Has had a sore throat and pain with swallowing since NGT insertion. Has white coating on tongue. Is at risk for candida given immunosuppression. ID would like to empirically cover.  - PRN chloraseptic spray  - PRN ice chips  - Continue micafungin 150mg  daily    Chronic Problems  Hypothyroidism: Continue Synthroid. Repeat TSH wnl.       The patient's presentation is complicated by the following clinically significant conditions requiring additional evaluation and treatment: - Thrombocytopenia POA requiring further investigation or monitor  - Chronic kidney disease POA requiring further investigation, treatment, or monitoring   - Hypokalemia POA requiring further investigation, treatment, or monitoring  - Acidosis POA requiring further investigation, treatment, or monitoring     Issues Impacting Complexity of Management:  -High risk of complications from pain and/or analgesia likely to result in delirium  -The patient is at high risk for the development of complications of volume overload due to the need to provide IV hydration for suspected hypovolemia in the setting of: kidney transplant    Checklist:  Diet: NPO  DVT PPx: Heparin 5000units q8h  Code Status: Full Code  Dispo: Stepdown status    Team Contact Information:   Primary Team: Internal Medicine (MEDW)  Primary Resident: Quentin Cornwall, MD  Resident's Pager: [phone] (Gen MedW Intern - Alvester Morin)    Interval History:   S/p VIR aspiration. Gastrografin challenge, subsequent KUB did not show contrast in the colon. However had emesis before 3am KUB. NGT unclamped ~@0500 , 1L output since. Refused evening meds. PRN labetalol x2. Has decreased throat pain.     Objective:   Temp:  [36.3 ??C (97.3 ??F)-36.8 ??C (98.3 ??F)] 36.6 ??C (97.9 ??F)  Heart Rate:  [63-83] 83  SpO2 Pulse:  [63-75] 69  Resp:  [14-29] 29  BP: (142-219)/(24-51) 194/38  A BP-1: (210)/(45) 210/45  SpO2:  [91 %-100 %] 95 %    Gen: sitting up in bed, NAD  HENT: atraumatic, normocephalic, EOM grossly intact  Heart: RRR, systolic murmur  Lungs: bilateral crackles at lung bases, improved from prior  Abdomen: soft, distended, (+) TTP, (+) BS  Extremities: No edema, wwp  Neuro: alert, oriented, no focal deficits appreciated

## 2022-10-11 NOTE — Unmapped (Signed)
VENOUS ACCESS TEAM RECOMMENDATION     Indication:  Poor Venous Access    This patient has been assessed by the Venous Access Team.  [facility] and ultrasound assessments have been performed by two VAT nurses.  Unfortunately, on today's assessment, this patient does not have the vasculature to support continued PIV placement.      Pertinent findings from today's assessment include:    Right arm assessment:   inadequately sized veins    Left arm assessment:   patient save arm due to fistula/grafts      At this time, if clinically indicated, we suggest   temporary line or PIV in the EJ inserted by the provider      Workup / Procedure Time:  15 minutes    RN was notified. Dr. Maris Berger via Epic page has been notified.     Thank you,       Melodye Ped, RN Venous Access Team [phone]

## 2022-10-11 NOTE — Unmapped (Signed)
Pt alert oriented x4, complained of abdominal pain pain meds given per order, other vss. Pt had a bowel movements this morning but mixed with urine so unable to send GI Path. Family at bedside updated on plans of care. Pt to get 1 unit PRBC but need type and screen, pt/ family refused blood draws without lidocaine application, the lido ordered but family refused the cream until this evening, preferred a spray as it works fast and volunteered to bring a spray from home. The family were convinced to let the nurse apply the cream and maybe by the time phlebotomy gets here, it would have kicked in, the team made aware. Pt up to the commode and ambulated in the hallway with fair tolerance. Gastrografin study scheduled for the early morning. Family updated.  Problem: Adult Inpatient Plan of Care  Goal: Plan of Care Review  Outcome: Progressing  Goal: Patient-Specific Goal (Individualized)  Outcome: Progressing  Goal: Absence of Hospital-Acquired Illness or Injury  Outcome: Progressing  Intervention: Identify and Manage Fall Risk  Recent Flowsheet Documentation  Taken 10/10/2022 0800 by Samantha Crimes, RN  Safety Interventions:   low bed   fall reduction program maintained   nonskid shoes/slippers when out of bed  Intervention: Prevent Skin Injury  Recent Flowsheet Documentation  Taken 10/10/2022 1600 by Samantha Crimes, RN  Positioning for Skin:   Supine/Back   Sitting in Chair  Taken 10/10/2022 1200 by Samantha Crimes, RN  Positioning for Skin: Supine/Back  Taken 10/10/2022 0800 by Samantha Crimes, RN  Positioning for Skin: Supine/Back  Intervention: Prevent and Manage VTE (Venous Thromboembolism) Risk  Recent Flowsheet Documentation  Taken 10/10/2022 1600 by Samantha Crimes, RN  VTE Prevention/Management: ambulation promoted  Anti-Embolism Device Type: SCD, Knee  Anti-Embolism Intervention: Refused  Anti-Embolism Device Location: BLE  Taken 10/10/2022 1400 by Samantha Crimes, RN  Anti-Embolism Device Type: SCD, Knee  Anti-Embolism Intervention: Refused  Anti-Embolism Device Location: BLE  Taken 10/10/2022 1200 by Samantha Crimes, RN  VTE Prevention/Management: ambulation promoted  Anti-Embolism Device Type: SCD, Knee  Anti-Embolism Intervention: Refused  Anti-Embolism Device Location: BLE  Taken 10/10/2022 1000 by Samantha Crimes, RN  Anti-Embolism Device Type: SCD, Knee  Anti-Embolism Intervention: Refused  Anti-Embolism Device Location: BLE  Taken 10/10/2022 0800 by Samantha Crimes, RN  VTE Prevention/Management: ambulation promoted  Anti-Embolism Device Type: SCD, Knee  Anti-Embolism Intervention: Refused  Anti-Embolism Device Location: BLE  Intervention: Prevent Infection  Recent Flowsheet Documentation  Taken 10/10/2022 0800 by Samantha Crimes, RN  Infection Prevention: hand hygiene promoted  Goal: Optimal Comfort and Wellbeing  Outcome: Progressing  Goal: Readiness for Transition of Care  Outcome: Progressing  Goal: Rounds/Family Conference  Outcome: Progressing     Problem: Adult Inpatient Plan of Care  Goal: Patient-Specific Goal (Individualized)  Outcome: Progressing     Problem: Adult Inpatient Plan of Care  Goal: Patient-Specific Goal (Individualized)  Outcome: Progressing     Problem: Adult Inpatient Plan of Care  Goal: Absence of Hospital-Acquired Illness or Injury  Outcome: Progressing  Intervention: Identify and Manage Fall Risk  Recent Flowsheet Documentation  Taken 10/10/2022 0800 by Samantha Crimes, RN  Safety Interventions:   low bed   fall reduction program maintained   nonskid shoes/slippers when out of bed  Intervention: Prevent Skin Injury  Recent Flowsheet Documentation  Taken 10/10/2022 1600 by Samantha Crimes, RN  Positioning for Skin:   Supine/Back   Sitting in Chair  Taken 10/10/2022 1200 by Theola Sequin,  Jerl Santos, RN  Positioning for Skin: Supine/Back  Taken 10/10/2022 0800 by Samantha Crimes, RN  Positioning for Skin: Supine/Back  Intervention: Prevent and Manage VTE (Venous Thromboembolism) Risk  Recent Flowsheet Documentation  Taken 10/10/2022 1600 by Samantha Crimes, RN  VTE Prevention/Management: ambulation promoted  Anti-Embolism Device Type: SCD, Knee  Anti-Embolism Intervention: Refused  Anti-Embolism Device Location: BLE  Taken 10/10/2022 1400 by Samantha Crimes, RN  Anti-Embolism Device Type: SCD, Knee  Anti-Embolism Intervention: Refused  Anti-Embolism Device Location: BLE  Taken 10/10/2022 1200 by Samantha Crimes, RN  VTE Prevention/Management: ambulation promoted  Anti-Embolism Device Type: SCD, Knee  Anti-Embolism Intervention: Refused  Anti-Embolism Device Location: BLE  Taken 10/10/2022 1000 by Samantha Crimes, RN  Anti-Embolism Device Type: SCD, Knee  Anti-Embolism Intervention: Refused  Anti-Embolism Device Location: BLE  Taken 10/10/2022 0800 by Samantha Crimes, RN  VTE Prevention/Management: ambulation promoted  Anti-Embolism Device Type: SCD, Knee  Anti-Embolism Intervention: Refused  Anti-Embolism Device Location: BLE  Intervention: Prevent Infection  Recent Flowsheet Documentation  Taken 10/10/2022 0800 by Samantha Crimes, RN  Infection Prevention: hand hygiene promoted     Problem: Infection  Goal: Absence of Infection Signs and Symptoms  Outcome: Progressing  Intervention: Prevent or Manage Infection  Recent Flowsheet Documentation  Taken 10/10/2022 0800 by Samantha Crimes, RN  Infection Management: aseptic technique maintained  Isolation Precautions: enteric precautions maintained     Problem: Fall Injury Risk  Goal: Absence of Fall and Fall-Related Injury  Outcome: Progressing  Intervention: Identify and Manage Contributors  Recent Flowsheet Documentation  Taken 10/10/2022 0800 by Samantha Crimes, RN  Self-Care Promotion:   independence encouraged   BADL personal objects within reach   BADL personal routines maintained  Intervention: Promote Injury-Free Environment  Recent Flowsheet Documentation  Taken 10/10/2022 0800 by Samantha Crimes, RN  Safety Interventions: low bed   fall reduction program maintained   nonskid shoes/slippers when out of bed     Problem: Fall Injury Risk  Goal: Absence of Fall and Fall-Related Injury  Outcome: Progressing  Intervention: Identify and Manage Contributors  Recent Flowsheet Documentation  Taken 10/10/2022 0800 by Samantha Crimes, RN  Self-Care Promotion:   independence encouraged   BADL personal objects within reach   BADL personal routines maintained  Intervention: Promote Injury-Free Environment  Recent Flowsheet Documentation  Taken 10/10/2022 0800 by Samantha Crimes, RN  Safety Interventions:   low bed   fall reduction program maintained   nonskid shoes/slippers when out of bed     Problem: Self-Care Deficit  Goal: Improved Ability to Complete Activities of Daily Living  Outcome: Progressing  Intervention: Promote Activity and Functional Independence  Recent Flowsheet Documentation  Taken 10/10/2022 0800 by Samantha Crimes, RN  Self-Care Promotion:   independence encouraged   BADL personal objects within reach   BADL personal routines maintained     Problem: Comorbidity Management  Goal: Maintenance of Asthma Control  Outcome: Progressing  Goal: Maintenance of Behavioral Health Symptom Control  Outcome: Progressing  Goal: Maintenance of COPD Symptom Control  Outcome: Progressing  Goal: Blood Glucose Levels Within Targeted Range  Outcome: Progressing  Goal: Maintenance of Heart Failure Symptom Control  Outcome: Progressing  Goal: Blood Pressure in Desired Range  Outcome: Progressing  Goal: Maintenance of Osteoarthritis Symptom Control  Outcome: Progressing  Goal: Bariatric Home Regimen Maintained  Outcome: Progressing  Goal: Maintenance of Seizure Control  Outcome: Progressing     Problem: Skin Injury Risk Increased  Goal: Skin Health and Integrity  Intervention: Optimize Skin Protection  Recent Flowsheet Documentation  Taken 10/10/2022 1600 by Samantha Crimes, RN  Pressure Reduction Techniques: frequent weight shift encouraged  Pressure Reduction Devices: pressure-redistributing mattress utilized  Taken 10/10/2022 1200 by Samantha Crimes, RN  Pressure Reduction Techniques: frequent weight shift encouraged  Pressure Reduction Devices: pressure-redistributing mattress utilized  Taken 10/10/2022 0800 by Samantha Crimes, RN  Pressure Reduction Techniques: frequent weight shift encouraged  Pressure Reduction Devices: pressure-redistributing mattress utilized

## 2022-10-11 NOTE — Unmapped (Signed)
General Surgery Progress Note      Assessment:   72 y.o. female with complicated previous medical history status post DCD DDKT in 07/2022, hypertension, type 2 diabetes, hypothyroidism, who was admitted for GNR bacteremia. General surgery consulted 3/17 as patient was found to have dilated loops of small bowel with multiple transition points in the RLE on CT, concerning for SBO. Patient examined again this morning with increased epigastric tenderness. Although patient reports BM, there is concern that patient's obstruction is not resolved given high volume NGT output and no passage of flatus. There was no evidence of contrast in the colon after gastrograffin challenge. We will tentatively plan for surgical exploration on 3/21.      Plan:   - Recommend CT A/P w IV contrast   -We will tentatively plan for surgical exploration tomorrow   -NGT LIWS, NPO     Interval/Subjective:  NAEO. Failed gastrograffin challenge. 1L bilious NGT.       Objective:     Vital signs in last 24 hours:  Temp:  [36.3 ??C (97.3 ??F)-36.8 ??C (98.3 ??F)] 36.6 ??C (97.9 ??F)  Heart Rate:  [63-83] 83  SpO2 Pulse:  [63-75] 69  Resp:  [14-29] 29  BP: (142-219)/(24-51) 194/38  MAP (mmHg):  [71-109] 95  A BP-1: (210)/(45) 210/45  SpO2:  [91 %-100 %] 95 %    Intake/Output last 24 hours:  I/O last 3 completed shifts:  In: 0   Out: 2550 [Urine:950; Emesis/NG output:1600]    Physical Exam:  General: Well-appearing and in no acute distress; NGT in place with bilious drainage  Pulm: Normal respiratory effort    Cardiac: regular rate and rhythm   Abdomen: Soft, tender epigastrium and LUQ, mildly distended.        Data Review:    I have reviewed the labs and studies from the last 24 hours.      Imaging: Radiology studies were personally reviewed

## 2022-10-12 DIAGNOSIS — Z940 Kidney transplant status: Principal | ICD-10-CM

## 2022-10-12 LAB — COMPREHENSIVE METABOLIC PANEL
ALBUMIN: 3 g/dL — ABNORMAL LOW (ref 3.4–5.0)
ALKALINE PHOSPHATASE: 45 U/L — ABNORMAL LOW (ref 46–116)
ALT (SGPT): <7 U/L — ABNORMAL LOW (ref 10–49)
ANION GAP: 5 mmol/L (ref 5–14)
AST (SGOT): 19 U/L (ref <=34)
BILIRUBIN TOTAL: 0.4 mg/dL (ref 0.3–1.2)
BLOOD UREA NITROGEN: 33 mg/dL — ABNORMAL HIGH (ref 9–23)
BUN / CREAT RATIO: 32
CALCIUM: 8.4 mg/dL — ABNORMAL LOW (ref 8.7–10.4)
CHLORIDE: 112 mmol/L — ABNORMAL HIGH (ref 98–107)
CO2: 24 mmol/L (ref 20.0–31.0)
CREATININE: 1.04 mg/dL — ABNORMAL HIGH
EGFR CKD-EPI (2021) FEMALE: 57 mL/min/{1.73_m2} — ABNORMAL LOW (ref >=60)
GLUCOSE RANDOM: 121 mg/dL — ABNORMAL HIGH (ref 70–99)
POTASSIUM: 4.5 mmol/L (ref 3.4–4.8)
PROTEIN TOTAL: 5.5 g/dL — ABNORMAL LOW (ref 5.7–8.2)
SODIUM: 141 mmol/L (ref 135–145)

## 2022-10-12 LAB — CBC
HEMATOCRIT: 28.3 % — ABNORMAL LOW (ref 34.0–44.0)
HEMOGLOBIN: 9.7 g/dL — ABNORMAL LOW (ref 11.3–14.9)
MEAN CORPUSCULAR HEMOGLOBIN CONC: 34.4 g/dL (ref 32.0–36.0)
MEAN CORPUSCULAR HEMOGLOBIN: 31.8 pg (ref 25.9–32.4)
MEAN CORPUSCULAR VOLUME: 92.5 fL (ref 77.6–95.7)
MEAN PLATELET VOLUME: 7.8 fL (ref 6.8–10.7)
PLATELET COUNT: 106 10*9/L — ABNORMAL LOW (ref 150–450)
RED BLOOD CELL COUNT: 3.06 10*12/L — ABNORMAL LOW (ref 3.95–5.13)
RED CELL DISTRIBUTION WIDTH: 15.1 % (ref 12.2–15.2)
WBC ADJUSTED: 3 10*9/L — ABNORMAL LOW (ref 3.6–11.2)

## 2022-10-12 LAB — RENAL FUNCTION PANEL
ALBUMIN: 3 g/dL — ABNORMAL LOW (ref 3.4–5.0)
ANION GAP: 5 mmol/L (ref 5–14)
BLOOD UREA NITROGEN: 33 mg/dL — ABNORMAL HIGH (ref 9–23)
BUN / CREAT RATIO: 32
CALCIUM: 8.4 mg/dL — ABNORMAL LOW (ref 8.7–10.4)
CHLORIDE: 112 mmol/L — ABNORMAL HIGH (ref 98–107)
CO2: 24 mmol/L (ref 20.0–31.0)
CREATININE: 1.04 mg/dL — ABNORMAL HIGH
EGFR CKD-EPI (2021) FEMALE: 57 mL/min/{1.73_m2} — ABNORMAL LOW (ref >=60)
GLUCOSE RANDOM: 121 mg/dL — ABNORMAL HIGH (ref 70–99)
PHOSPHORUS: 2.2 mg/dL — ABNORMAL LOW (ref 2.4–5.1)
POTASSIUM: 4.5 mmol/L (ref 3.4–4.8)
SODIUM: 141 mmol/L (ref 135–145)

## 2022-10-12 LAB — APTT
APTT: 38.1 s (ref 24.8–38.4)
HEPARIN CORRELATION: 0.2

## 2022-10-12 LAB — TACROLIMUS LEVEL, TROUGH
TACROLIMUS, TROUGH: 5.4 ng/mL (ref 5.0–15.0)
TACROLIMUS, TROUGH: 5.2 ng/mL (ref 5.0–15.0)

## 2022-10-12 LAB — LACTATE, VENOUS, WHOLE BLOOD: LACTATE BLOOD VENOUS: 0.6 mmol/L (ref 0.5–1.8)

## 2022-10-12 LAB — PHOSPHORUS: PHOSPHORUS: 2.9 mg/dL (ref 2.4–5.1)

## 2022-10-12 LAB — MAGNESIUM: MAGNESIUM: 1.9 mg/dL (ref 1.6–2.6)

## 2022-10-12 MED ADMIN — methylPREDNISolone sodium succinate (PF) (SOLU-Medrol) injection 5.2 mg: 5 mg | INTRAVENOUS | @ 09:00:00 | Stop: 2022-10-12

## 2022-10-12 MED ADMIN — tacrolimus (PROGRAF) 1.5mg combo product: 1.5 mg | SUBLINGUAL | @ 14:00:00 | Stop: 2022-10-12

## 2022-10-12 MED ADMIN — heparin (porcine) 5,000 unit/mL injection 5,000 Units: 5000 [IU] | SUBCUTANEOUS | @ 20:00:00

## 2022-10-12 MED ADMIN — tacrolimus (PROGRAF) capsule 1 mg: 1 mg | SUBLINGUAL | @ 01:00:00

## 2022-10-12 MED ADMIN — meropenem (MERREM) 1 g in sodium chloride 0.9 % (NS) 100 mL IVPB-MBP: 1 g | INTRAVENOUS | @ 01:00:00 | Stop: 2022-10-15

## 2022-10-12 MED ADMIN — HYDROmorphone (PF) injection Syrg 0.25 mg: .25 mg | INTRAVENOUS | @ 01:00:00 | Stop: 2022-10-21

## 2022-10-12 MED ADMIN — heparin (porcine) 5,000 unit/mL injection 5,000 Units: 5000 [IU] | SUBCUTANEOUS | @ 01:00:00

## 2022-10-12 MED ADMIN — labetalol (NORMODYNE) injection: 20 mg | INTRAVENOUS | @ 04:00:00

## 2022-10-12 MED ADMIN — amlodipine (NORVASC) tablet 5 mg: 5 mg | ORAL | @ 01:00:00

## 2022-10-12 MED ADMIN — amlodipine (NORVASC) tablet 5 mg: 5 mg | ORAL | @ 14:00:00

## 2022-10-12 MED ADMIN — valGANciclovir (VALCYTE) tablet 450 mg: 450 mg | ORAL | @ 20:00:00

## 2022-10-12 MED ADMIN — heparin (porcine) 5,000 unit/mL injection 5,000 Units: 5000 [IU] | SUBCUTANEOUS | @ 09:00:00

## 2022-10-12 MED ADMIN — levothyroxine (SYNTHROID) tablet 112 mcg: 112 ug | ORAL | @ 09:00:00

## 2022-10-12 MED ADMIN — pantoprazole (Protonix) injection 40 mg: 40 mg | INTRAVENOUS | @ 01:00:00

## 2022-10-12 MED ADMIN — magnesium oxide-Mg AA chelate (Magnesium Plus Protein) 2 tablet: 2 | ORAL | @ 14:00:00

## 2022-10-12 MED ADMIN — meropenem (MERREM) 1 g in sodium chloride 0.9 % (NS) 100 mL IVPB-MBP: 1 g | INTRAVENOUS | @ 15:00:00 | Stop: 2022-10-15

## 2022-10-12 MED ADMIN — mycophenolate (CELLCEPT) 500 mg in dextrose 5 % 100 mL IVPB: 500 mg | INTRAVENOUS | @ 16:00:00

## 2022-10-12 MED ADMIN — hydrALAZINE (APRESOLINE) tablet 25 mg: 25 mg | ORAL | @ 16:00:00 | Stop: 2022-10-12

## 2022-10-12 MED ADMIN — hydrALAZINE (APRESOLINE) tablet 25 mg: 25 mg | ORAL | @ 14:00:00 | Stop: 2022-10-12

## 2022-10-12 MED ADMIN — magnesium oxide-Mg AA chelate (Magnesium Plus Protein) 2 tablet: 2 | ORAL | @ 01:00:00

## 2022-10-12 MED ADMIN — mycophenolate (CELLCEPT) 500 mg in dextrose 5 % 100 mL IVPB: 500 mg | INTRAVENOUS | @ 01:00:00

## 2022-10-12 MED ADMIN — micafungin (MYCAMINE) 150 mg in sodium chloride (NS) 0.9 % 100 mL IVPB: 150 mg | INTRAVENOUS | @ 16:00:00 | Stop: 2022-10-17

## 2022-10-12 MED ADMIN — hydrALAZINE (APRESOLINE) tablet 25 mg: 25 mg | ORAL | @ 01:00:00

## 2022-10-12 MED ADMIN — sodium chloride (NS) 0.9 % infusion: 75 mL/h | INTRAVENOUS | @ 10:00:00 | Stop: 2022-10-12

## 2022-10-12 MED ADMIN — carvedilol (COREG) tablet 25 mg: 25 mg | ORAL | @ 01:00:00

## 2022-10-12 MED ADMIN — atorvastatin (LIPITOR) tablet 10 mg: 10 mg | ORAL | @ 14:00:00

## 2022-10-12 MED ADMIN — insulin glargine (LANTUS) injection 6 Units: 6 [IU] | SUBCUTANEOUS | @ 01:00:00

## 2022-10-12 MED ADMIN — labetalol (NORMODYNE) injection: 20 mg | INTRAVENOUS | @ 11:00:00

## 2022-10-12 MED ADMIN — pantoprazole (Protonix) injection 40 mg: 40 mg | INTRAVENOUS | @ 14:00:00

## 2022-10-12 MED ADMIN — OLANZapine (ZyPREXA) 2.5 mg, sterile water 2.1 mL injection: 2.5 mg | INTRAMUSCULAR | @ 05:00:00 | Stop: 2022-10-12

## 2022-10-12 MED ADMIN — carvedilol (COREG) tablet 25 mg: 25 mg | ORAL | @ 14:00:00

## 2022-10-12 NOTE — Unmapped (Signed)
General Surgery Progress Note      Assessment:   72 y.o. female with complicated previous medical history status post DCD DDKT in 07/2022, hypertension, type 2 diabetes, hypothyroidism, who was admitted for GNR bacteremia. General surgery consulted 3/17 as patient was found to have dilated loops of small bowel with multiple transition points in the RLE on CT, concerning for SBO. Continues to have high output from NGT in conjunction with failed GG challenge.     Plan:   - Recommend CT A/P    -NGT LIWS, NPO     Interval/Subjective:  NAEO. 1.9L bilious emesis       Objective:     Vital signs in last 24 hours:  Temp:  [36 ??C (96.8 ??F)-37.2 ??C (98.9 ??F)] 36.4 ??C (97.5 ??F)  Heart Rate:  [69-80] 69  SpO2 Pulse:  [69-77] 69  Resp:  [14-21] 15  BP: (151-248)/(30-95) 152/50  MAP (mmHg):  [85-133] 96  SpO2:  [93 %-99 %] 96 %    Intake/Output last 24 hours:  I/O last 3 completed shifts:  In: 2137.5 [I.V.:212.5; Blood:475; NG/GT:300; IV Piggyback:1150]  Out: 3150 [Urine:450; Emesis/NG output:2700]    Physical Exam:  General: Well-appearing and in no acute distress; NGT in place with bilious drainage  Pulm: Normal respiratory effort    Cardiac: regular rate and rhythm   Abdomen: Soft, tender epigastrium and LUQ, mildly distended.        Data Review:    I have reviewed the labs and studies from the last 24 hours.      Imaging: Radiology studies were personally reviewed

## 2022-10-12 NOTE — Unmapped (Signed)
VENOUS ACCESS TEAM PROCEDURE    Nurse request was placed for a PIV by Venous Access Team (VAT).  Patient was assessed at bedside for placement of a PIV. PPE were donned per protocol.  Access was obtained. Blood return noted.  Dressing intact and device well secured.  Flushed with normal saline.  See LDA for details.  Pt advised to inform RN of any s/s of discomfort at the PIV site.    Workup / Procedure Time:  30 minutes       Primary RN was notified.       Thank you,     Tilda Burrow, RN Venous Access Team

## 2022-10-12 NOTE — Unmapped (Signed)
ICU TRANSPORT NOTE    Destination: CT    Departing Unit: CTSU  Pickup Time: 1620    Return Unit: CTSU  Return Time: 1640    Octa patient ID band verified  Allergies Reviewed  Code Status at time of transport: Full    Report received from primary nurse via [facility]. Handoff performed . Patient transported via stretcher under stepdown level of care. See vital signs during transport via [employer]. O2 via RA. Patient is alert and following commands. Patient tolerated procedure/scan well. Enteric precautions maintained throughout transport.    Update and care given to primary nurse. See Doc Flowsheets/MAR for additional transportation documentation. Proper body mechanics and safe patient handling equipment were utilized throughout transport.

## 2022-10-12 NOTE — Unmapped (Signed)
Tacrolimus Therapeutic Monitoring Pharmacy Note    Julie [name] is a 72 y.o. female continuing tacrolimus.     Indication: Kidney transplant     Date of Transplant: 08/06/2022     Prior Dosing Information: Current regimen 1.5 mg SL AM and 1 mg SL PM. Home regimen: PO Prograf 2 mg BID.       Source(s) of information used to determine prior to admission dosing: Home Medication List or Clinic Note    Goals:  Therapeutic Drug Levels  Tacrolimus trough goal: 7-9 ng/mL - goal confirmed with transplant nephrology attending 3/19     Additional Clinical Monitoring/Outcomes  Monitor renal function (SCr and urine output) and liver function (LFTs)  Monitor for signs/symptoms of adverse events (e.g., hyperglycemia, hyperkalemia, hypomagnesemia, hypertension, headache, tremor)    Results:   Tacrolimus level:  5.2 ng/mL, drawn appropriately. Of note, 3/19 evening dose missed due to nausea/patient refused.    Pharmacokinetic Considerations and Significant Drug Interactions:  Concurrent hepatotoxic medications: None identified  Concurrent CYP3A4 substrates/inhibitors: None identified  Concurrent nephrotoxic medications: None identified    Assessment/Plan:  Recommendedation(s)  After discussion with nephrology, level is subtherapeutic and will increase PM dose to 1.5 mg SL.   Change current regimen to 1.5 mg SL in AM and 1.5 mg SL in PM.   Tacrolimus administration is ordered for 0800/2000. Tacrolimus level ordered for 0730 tomorrow.     Follow-up  Day levels ordered x5 days through 3/26.   A pharmacist will continue to monitor and recommend levels as appropriate    Please page service pharmacist with questions/clarifications.    Pamella Pert, PharmD Candidate     Charlestine Massed, PharmD  PGY1 Pharmacy Resident - Ambulatory Care Track

## 2022-10-12 NOTE — Unmapped (Signed)
IMMUNOCOMPROMISED HOST INFECTIOUS DISEASE CONSULT NOTE    Julie [name] is being seen in consultation at the request of Johnsie Cancel, MD for evaluation of UTI in transplant patient.    Assessment/Recommendations:    Julie [name] is a 72 y.o. 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 Had perinephric drain in place postoperatively, removed 08/30/22  - Immunosuppression: Tacrolimus (goal 7-9), MM 500F, Pred 5  - Prophylaxis: valganciclovir x 6 months (mod risk), TMP+SMX x 6 months     Pertinent comorbidities:  ESRD was on PD and HD  DM II (A1c 5.6 on 08/06/22)  CAD  HLD  SBO 10/07/22, managing with supportive care     Pertinent exposures:  Originally from [place]   Treated with ivermectin at the ID clinic in 2018.     Summary of pertinent prior infections:  #History of Shingles 2017  #04/2018 Dialysis fluid S[place]ia R: Ampicillin, Unasyn, Cefazolin,S: Ceftriaxone, Gentamicin, Levo, PipTazo, Tobra  #Donor urine cx with C. Dubliensis 08/05/22, <10K, negative recipient samples, negative donor blood cx, treated with 2 weeks PO fluconazole  #E. Coli bacteremia and UTI 08/21/22  - Ucx/Bcx 1/29 E. Coli, R amp, amp/sulb, TMP/SMX. Susc CFZ, cipro/levo, gent/tobra, nitrofurantoin, tetra  - Treated with vanc/CFP -> 3 weeks levofloxacin through 2/26 (ureteral stent removed 2/15)     Active infection:  # ESBL/FQ-R E. coli bacteremia and UTI 10/04/22, improving  # Perinephric fluid collection 10/05/22, re-accumulated after JP drain removal 08/30/22  3/12 developed fever and malaise at home, per family poor PO intake and diarrhea  - 3/14 Bcx 2/2 E. Coli, susc-dose-dependent CFP, R cipro/levo, susc erta/mero, tobra, gent (R TMP/SMX, tetra, susc nitrofurantoin, mino)   - 3/16 Bcx x 2 NGTD  - 3/14 UA 26 WBC, Ucx 10-50K E. Coli (no AST) - fosfomycin susc pending  - 3/14 Imaging:   Renal US 8.1 x 0.8 x 2.3 cm perinephric collection new from prior, considering small hematoma, complicated seroma, or less likely abscess  CT A/P without contrast: perinephric stranding, 5.3 x 2.0 cm ill-defined perinephric collection limited evaluation without contrast. No stones seen. 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 Coli UTI/bacteremia  Continue meropenem, renally dosed equivalent of 1g q8h, duration at least 2 weeks (START 10/05/22)  but extended duration possible depending on pending microbiology and clinical course    For thrush and presumed candida esophagitis  Continue IV micafungin 150 mg IV daily (dosed for esophagitis)    Regarding line acces, consider temporary CVC (like RIJ triple lumen) for now given uncertainty regarding possible need for abdominal surgery.    Antimicrobial prophylaxis required for host deficiency: transplant immunosuppression  Viral: Valganciclovir  PJP: TMP/SMX MWF    Intensive toxicity monitoring for prescription antimicrobials   CBC w/diff at least once per week  CMP at least once per week  clinical assessments for rashes or other skin changes    The [facility] ID service will continue to follow.           Please page the ID Transplant/Liquid Oncology Fellow consult at [phone] with questions.  Patient discussed with Dr. Raylene Miyamoto.    Jacquenette Shone, MD  [facility]    History of Present Illness:      External record(s): Consultant note(s): Surgery eval concern for true SBO and awaiting CT abdomen to determine next step plan for ex-lap tentatively planned in near future (days) .    Independent historian(s): Family member reports patient has agree to CT abdomen today .       Interval hx:  OT eval = plan for 5x weekly, low intensity  RRT this AM for change in blood pressure. BP in flowsheet to 231/64 and then decreased. Does not endorse headache, chest pain, or other new symptoms.    Team inquiring about central access, PIVs not very durable    Afebrile, Tm  37.8; WBC 3.0 (in range of 2.8 - 3.7)     No new complaints.    Remains on mero 1 g q8 Hr and mica 150 mg daily        Vital Signs last 24 hours:  Temp:  [36 ??C (96.8 ??F)-37.2 ??C (98.9 ??F)] 37 ??C (98.6 ??F)  Heart Rate:  [68-85] 74  SpO2 Pulse:  [66-77] 74  Resp:  [14-22] 18  BP: (151-248)/(30-95) 168/52  MAP (mmHg):  [85-133] 96  SpO2:  [92 %-99 %] 94 %    Physical Exam:  Patient Lines/Drains/Airways Status       Active Active Lines, Drains, & Airways       Name Placement date Placement time Site Days    NG/OG Tube Decompression 16 Fr. Right nostril 10/07/22  1300  Right nostril  5    Peripheral IV 10/10/22 Anterior;Proximal;Right Forearm 10/10/22  1943  Forearm  1    Peripheral IV 10/12/22 Anterior;Distal;Right;Upper Arm 10/12/22  1154  Arm  less than 1    Arteriovenous Fistula - Vein Graft  Access 08/24/16 Left;Upper Arm 08/24/16  --  Arm  2240                    Const [x]  vital signs above    []  NAD, non-toxic appearance []  Chronically ill-appearing, non-distressed  Sitting in chair, awake/alert in no distress      Eyes [x]  Lids normal bilaterally, conjunctiva anicteric and noninjected OU     [] PERRL  [] EOMI        ENMT [x]  Normal appearance of external nose and ears, no nasal discharge        []  MMM, no lesions on lips or gums []  No thrush, leukoplakia, oral lesions  []  Dentition good []  Edentulous []  Dental caries present  []  Hearing normal  []  TMs with good light reflexes bilaterally  Decrease in wath patches on roof of mouth  NGT in place, on low intermittent suction with yellow-green output in canister      Neck [x]  Neck of normal appearance and trachea midline        []  No thyromegaly, nodules, or tenderness   []  Full neck ROM        Lymph []  No LAD in neck     []  No LAD in supraclavicular area     []  No LAD in axillae   []  No LAD in epitrochlear chains     []  No LAD in inguinal areas        CV [x]  RRR            []  No peripheral edema     []  Pedal pulses intact   []  No abnormal heart sounds appreciated   []  Extremities WWP   No change in murmur      Resp []  Normal WOB at rest    []  No breathlessness with speaking, no coughing  []  CTA anteriorly    []  CTA posteriorly    Breathing comfortably      GI [x]  Normal inspection, NTND   []  NABS     []  No umbilical hernia on exam       []  No hepatosplenomegaly     []  Inspection of perineal and perianal areas normal  Increased distension from 3/20; but nontender to palpation      GU []  Normal external genitalia     [] No urinary catheter present in urethra   []  No CVA tenderness    []  No tenderness over renal allograft        MSK []  No clubbing or cyanosis of hands       []  No vertebral point tenderness  []  No focal tenderness or abnormalities on palpation of joints in RUE, LUE, RLE, or LLE        Skin [x]  No rashes, lesions, or ulcers of visualized skin     []  Skin warm and dry to palpation   LUE prior graft in place with thrill      Neuro [x]  Face expression symmetric  []  Sensation to light touch grossly intact throughout    [x]  Moves extremities equally    []  No tremor noted        []  CNs II-XII grossly intact     []  DTRs normal and symmetric throughout []  Gait unremarkable        Psych []  Appropriate affect       []  Fluent speech         []  Attentive, good eye contact  []  Oriented to person, place, time          []  Judgment and insight are appropriate   Alert, speech normal        Data for Medical Decision Making     (3/14) EKG QTcF 438    I discussed mgm't w/qualified health care professional(s) involved in case: discussed with case management and primary team regarding need for IV antibiotics to treat the infection, duration will partially depend on hospital/clinical course .    I reviewed micro result(s) (yeast screen pending) Culture of perinephric fluid collection remains no growth).  AXR without evidence of contrast passage (and as reviewed by surgery consult note)  I independently visualized/interpreted not done.       Recent Labs   Lab Units 10/12/22  0716   WBC 10*9/L 3.0*   HEMOGLOBIN g/dL 9.7*  PLATELET COUNT (1) 10*9/L 106*   BUN mg/dL 33* - 33*   CREATININE mg/dL 1.61* - 0.96*   AST U/L 19   ALT U/L <7*   BILIRUBIN TOTAL mg/dL 0.4   ALK PHOS U/L 45*   POTASSIUM mmol/L 4.5 - 4.5   MAGNESIUM mg/dL 1.9   PHOSPHORUS mg/dL 2.2*   CALCIUM mg/dL 8.4* - 8.4*       Lab Results   Component Value Date    Tacrolimus, Trough 5.2 10/12/2022       Microbiology:  Microbiology Results (last day)       Procedure Component Value Date/Time Date/Time    Blood Culture #2 [[id number]]  (Normal) Collected: 10/07/22 0624    Lab Status: Preliminary result Specimen: Blood from 1 Peripheral Draw Updated: 10/11/22 0645     Blood Culture, Routine No Growth at 4 days    Blood Culture #1 [[id number]]  (Normal) Collected: 10/07/22 0620    Lab Status: Preliminary result Specimen: Blood from 1 Peripheral Draw Updated: 10/11/22 0645     Blood Culture, Routine No Growth at 4 days    Creatinine, body fluid [[id number]] Collected: 10/10/22 1118    Lab Status: Final result Specimen: Fluid, Peritoneal from Other-enter as order comment Updated: 10/10/22 1934     Creat, Fluid 1.5 mg/dL      Comment: Fluid creatinine to blood creatinine ratios >1.0 suggest the presence of urine.        Creat, Fluid Type Fluid, Peritoneal    Narrative:      The reference range for this body fluid has not been established. The test result must be integrated into the clinical context for interpretation.  This test was developed and its performance characteristics determined by the [facility] of the [employer] [employer]. This test has not been cleared or approved by the FDA. The laboratory is regulated under CAP and CLIA as qualified to perform high-complexity testing. This test is to be used for clinical purposes and should not be regarded as investigational or for research.    Micro Add-on [[id number]] Collected: 10/05/22 1002    Lab Status: Preliminary result Specimen: Urine from Clean Catch Updated: 10/10/22 1814    Yeast Screen [[id number]] Collected: 10/10/22 1510    Lab Status: In process Specimen: Throat Updated: 10/10/22 1534    Aerobic/Anaerobic Culture [[id number]] Collected: 10/10/22 1118    Lab Status: Preliminary result Specimen: Aspirate from Kidney, Left Updated: 10/10/22 1356     Gram Stain Result 1+ Polymorphonuclear leukocytes      No organisms seen    Narrative:      Specimen Source: Kidney, Left            Imaging:  XR Abdomen 1 View    Result Date: 10/12/2022  EXAM: XR ABDOMEN 1 VIEW ACCESSION: [id number] UN     CLINICAL INDICATION: 72 years old with ABDOMINAL PAIN  -  UNSPECIFIED SITE      COMPARISON: KUB 10/11/2022 and prior.     TECHNIQUE: Portable supine views of the abdomen, 2 images(s)     FINDINGS: Unchanged position of nonweighted enteric tube. Redemonstration of pelvic surgical clips secondary to left renal transplant.     Unchanged appearance of multiple loops of small bowel some of which are air-filled others are opacified by oral contrast. There is similar degree of small bowel dilation with contrast retention.     Lung bases are clear.     No aggressive osseous lesions. Pelvic phleboliths.  position of esophagogastric  tube looped in the stomach with the tip directed towards the fundus.     Persistent gaseous distention and retention of contrast in multiple loops of mildly dilated small bowel measuring up to 4 cm         ECG 12 Lead    Result Date: 10/12/2022  NORMAL SINUS RHYTHM ST & T WAVE ABNORMALITY, CONSIDER LATERAL ISCHEMIA ABNORMAL ECG WHEN COMPARED WITH ECG OF 05-Oct-2022 13:08, NONSPECIFIC T WAVE ABNORMALITY NOW EVIDENT IN INFERIOR LEADS    XR Abdomen 1 View    Result Date: 10/11/2022  EXAM: XR ABDOMEN 1 VIEW ACCESSION: [id number] UN     CLINICAL INDICATION: 72 years old with INTESTINAL OBSTRUCTION      COMPARISON: KUB 10/11/2022 and prior.     TECHNIQUE: Portable supine views of the abdomen., 2 images(s)     FINDINGS: Unchanged position of nonweighted enteric tube.     Unchanged appearance of multiple loops of small bowel which are opacified by oral contrast. Unchanged degree of small bowel dilation. Contrast is demonstrated within the colon. Redemonstration of pelvic surgical clips in this patient with history of left renal transplant. Pelvic phleboliths.     Unchanged bibasilar atelectasis.         --Little interval change of bowel gas and contrast pattern. --Unchanged position of enteric tube.         IR Aspiration Hematoma Abscess    Result Date: 10/11/2022  EXAM: US-GUIDED PERCUTANEOUS ASPIRATION OF A FLUID COLLECTION  DATE: 10/10/2022 11:38 AM ACCESSION: [id number] UN DICTATED: 10/10/2022 4:15 PM INTERPRETATION LOCATION: [facility]     CLINICAL INDICATION: 72 years old Female: perinephric fluid collection, transplanted kidney     COMPARISON: CT abdomen/pelvis  CONSENT: Informed consent was obtained from the patient including a discussion of the alternatives, benefits, and risks including but not limited to infection, bleeding, need for additional procedure, and/or ICU admission.  PROCEDURE: The procedure was performed with the patient supine . Limited ultrasound images were obtained, demonstrating ill-defined fluid along the inferior margin of the transplant kidney. These images were saved and sent to PACS. A skin entry site was identified and was prepped and draped using all elements of maximal sterile barrier technique. Local anesthesia was achieved with lidocaine .  A 15 cm 18-G needle was inserted into the collection using ultrasound guidance. After entry into the fluid collection, the needle was aspirated. Aspiration of the needle returned 50 mL of serosanguineous fluid, and a specimen was sent for culture.  SEDATION: I personally spent 22 minutes, continuously monitoring the patient face-to-face during the administration of moderate sedation.  Radiology nurse was present for the duration of the procedure to assist in patient monitoring.  Pre and Post Sedation activities have been reviewed.          Successful ultrasound-guided aspiration of fluid along the inferior aspect of the left lower quadrant transplant kidney.     Attestation     Signer name: Benjamine Mola, MD     I attest that I was present for the procedure ????        ???? I reviewed the stored images and agree with the report as written.    XR Abdomen 1 View    Result Date: 10/11/2022  EXAM: XR ABDOMEN 1 VIEW ACCESSION: [id number] UN     CLINICAL INDICATION: 72 years old with ABDOMINAL PAIN  -  UNSPECIFIED SITE      COMPARISON: KUB 10/11/2022 and prior.     TECHNIQUE: Portable supine views  of the abdomen, 2 images(s)     FINDINGS: Nonweighted enteric tube courses below the diaphragm with tip and sideport projecting over the expected region of the stomach.     Multiple loops of small bowel are opacified with oral contrast similar to prior. Degree of small bowel dilation is unchanged from prior. No contrast is seen within the colon. Pelvic surgical clips in this patient with history of left renal transplant. Pelvic phleboliths.     Similar bibasilar atelectasis.         --Redemonstration of multiple similarly dilated loops of small bowel with intraluminal contrast which is largely unchanged from prior. No contrast is demonstrated within colon at the time of examination. --Appropriate positioning of nonweighted enteric tube.         XR Abdomen Portable    Result Date: 10/11/2022  EXAM: XR ABDOMEN PORTABLE ACCESSION: [id number] UN     CLINICAL INDICATION: 72 years old with gastrograffin challenge scout ; OTHER      COMPARISON: KUB earlier same day.     TECHNIQUE: Supine view of the abdomen, 2 images(s)     FINDINGS: Unchanged position of esophagogastric tube. The tip and sideport are coiled projecting over the gastric bubble. Unchanged small bowel dilation. Calcifications projecting over the bilateral renal fossa. Pelvic surgical clips.         --Unchanged position of esophagogastric tube with tip and sideport coiled over the gastric bubble.     -- Stable degree of dilated small bowel.         XR Abdomen 1 View    Result Date: 10/11/2022  EXAM: XR ABDOMEN 1 VIEW ACCESSION: [id number] UN     CLINICAL INDICATION: 72 years old with FOREIGN BODY DIGESTIVE SYSTEM      COMPARISON: KUB yesterday.     TECHNIQUE: Supine view of the abdomen, 2 images(s)     FINDINGS: Interval advancement of esophagogastric tube. The tip and sideport project over the left upper quadrant and is slightly coiled.     Multiple loops of small bowel are opacified with oral contrast. The degree of dilation is similar to yesterday. There is no contrast opacifying the colon at the time of this exam. Pelvic surgical clips. Pelvic phleboliths. Bibasilar atelectasis.         --Multiple loops of small bowel are dilated similar in degree to yesterday, oral contrast does not opacify the colon at the time of this exam.     --Interval advancement of esophagogastric tube, projecting over the stomach. XR Abdomen 1 View    Result Date: 10/10/2022  EXAM: XR ABDOMEN 1 VIEW ACCESSION: [id number] UN     CLINICAL INDICATION: 71 years old with NGT (CATHETER VASCULAR FIT & ADJ)      COMPARISON: KUB earlier today.     TECHNIQUE: Supine view of the abdomen, 1 images(s)     FINDINGS: Interval retraction of esophagogastric tube, the tip and sideport coiled over the gastric fundus. Similar degree of small bowel dilation. Calcifications overlying the bilateral renal fossa could represent vascular calcifications or renal calculi. Pelvic surgical loops.         Interval retraction of esophagogastric tube, curled over the gastric fundus. Stable mildly dilated loops of small bowel         XR Abdomen Portable    Result Date: 10/10/2022  EXAM: XR ABDOMEN PORTABLE ACCESSION: [id number] UN     CLINICAL INDICATION: 72 years old with Gastrograffin challenge ; OTHER      COMPARISON: CT abdomen/pelvis 10/07/2022     TECHNIQUE:  Supine view of the abdomen, 2 images(s)     FINDINGS: Esophagogastric tube is coiled within the stomach and the tip and sideport project over the left upper quadrant/gastric fundus, recommend retraction by 10 cm. Dilated loops of small bowel measuring up to 4.9 cm,, similar compared with prior CT examination/nonobstructive. Calcifications projecting over the bilateral renal fossa may represent vascular calcifications or calculi. Pelvic surgical clips. Vascular calcifications.         Esophagogastric tube coiled in the stomach with tip pointing retrograde, consider retraction by approximately 10 cm for more optimal positioning.     Persistent dilated small bowel, consistent with suspected small bowel obstruction.

## 2022-10-13 LAB — RENAL FUNCTION PANEL
ALBUMIN: 3.3 g/dL — ABNORMAL LOW (ref 3.4–5.0)
ANION GAP: 6 mmol/L (ref 5–14)
BLOOD UREA NITROGEN: 36 mg/dL — ABNORMAL HIGH (ref 9–23)
BUN / CREAT RATIO: 28
CALCIUM: 9.3 mg/dL (ref 8.7–10.4)
CHLORIDE: 107 mmol/L (ref 98–107)
CO2: 26 mmol/L (ref 20.0–31.0)
CREATININE: 1.27 mg/dL — ABNORMAL HIGH
EGFR CKD-EPI (2021) FEMALE: 45 mL/min/{1.73_m2} — ABNORMAL LOW (ref >=60)
GLUCOSE RANDOM: 105 mg/dL (ref 70–179)
PHOSPHORUS: 2.1 mg/dL — ABNORMAL LOW (ref 2.4–5.1)
POTASSIUM: 4.5 mmol/L (ref 3.4–4.8)
SODIUM: 139 mmol/L (ref 135–145)

## 2022-10-13 LAB — CBC
HEMATOCRIT: 29.9 % — ABNORMAL LOW (ref 34.0–44.0)
HEMOGLOBIN: 10.3 g/dL — ABNORMAL LOW (ref 11.3–14.9)
MEAN CORPUSCULAR HEMOGLOBIN CONC: 34.3 g/dL (ref 32.0–36.0)
MEAN CORPUSCULAR HEMOGLOBIN: 32.3 pg (ref 25.9–32.4)
MEAN CORPUSCULAR VOLUME: 94 fL (ref 77.6–95.7)
MEAN PLATELET VOLUME: 8.3 fL (ref 6.8–10.7)
PLATELET COUNT: 140 10*9/L — ABNORMAL LOW (ref 150–450)
RED BLOOD CELL COUNT: 3.18 10*12/L — ABNORMAL LOW (ref 3.95–5.13)
RED CELL DISTRIBUTION WIDTH: 15.6 % — ABNORMAL HIGH (ref 12.2–15.2)
WBC ADJUSTED: 4.2 10*9/L (ref 3.6–11.2)

## 2022-10-13 LAB — MAGNESIUM: MAGNESIUM: 2.1 mg/dL (ref 1.6–2.6)

## 2022-10-13 LAB — LACTATE, VENOUS, WHOLE BLOOD: LACTATE BLOOD VENOUS: 0.9 mmol/L (ref 0.5–1.8)

## 2022-10-13 LAB — TACROLIMUS LEVEL, TROUGH: TACROLIMUS, TROUGH: 11 ng/mL (ref 5.0–15.0)

## 2022-10-13 MED ADMIN — ROCuronium (ZEMURON) injection: INTRAVENOUS | @ 18:00:00 | Stop: 2022-10-13

## 2022-10-13 MED ADMIN — fentaNYL (PF) (SUBLIMAZE) injection: INTRAVENOUS | @ 19:00:00 | Stop: 2022-10-13

## 2022-10-13 MED ADMIN — cloNIDine HCL (CATAPRES) tablet 0.1 mg: .1 mg | ORAL | @ 01:00:00

## 2022-10-13 MED ADMIN — valGANciclovir (VALCYTE) tablet 450 mg: 450 mg | ORAL | @ 12:00:00 | Stop: 2022-10-13

## 2022-10-13 MED ADMIN — methylPREDNISolone sodium succinate (PF) (SOLU-Medrol) injection 4 mg: 4 mg | INTRAVENOUS | @ 10:00:00

## 2022-10-13 MED ADMIN — atorvastatin (LIPITOR) tablet 10 mg: 10 mg | ORAL | @ 12:00:00

## 2022-10-13 MED ADMIN — Propofol (DIPRIVAN) injection: INTRAVENOUS | @ 18:00:00 | Stop: 2022-10-13

## 2022-10-13 MED ADMIN — haloperidol LACTATE (HALDOL) injection: INTRAVENOUS | @ 19:00:00 | Stop: 2022-10-13

## 2022-10-13 MED ADMIN — menthol (COUGH DROPS) lozenge 1 lozenge: 1 | ORAL | @ 03:00:00

## 2022-10-13 MED ADMIN — tacrolimus (PROGRAF) 1.5mg combo product: 1.5 mg | SUBLINGUAL | @ 12:00:00 | Stop: 2022-10-13

## 2022-10-13 MED ADMIN — hydrALAZINE (APRESOLINE) tablet 50 mg: 50 mg | ORAL | @ 12:00:00

## 2022-10-13 MED ADMIN — meropenem (MERREM) 1 g in sodium chloride 0.9 % (NS) 100 mL IVPB-MBP: 1 g | INTRAVENOUS | @ 01:00:00 | Stop: 2022-10-15

## 2022-10-13 MED ADMIN — carvedilol (COREG) tablet 25 mg: 25 mg | ORAL | @ 12:00:00

## 2022-10-13 MED ADMIN — sulfamethoxazole-trimethoprim (BACTRIM) 400-80 mg tablet 80 mg of trimethoprim: 1 | ORAL | @ 13:00:00 | Stop: 2023-01-15

## 2022-10-13 MED ADMIN — sodium chloride (NS) 0.9 % infusion: 75 mL/h | INTRAVENOUS | @ 12:00:00 | Stop: 2022-10-13

## 2022-10-13 MED ADMIN — fentaNYL (PF) (SUBLIMAZE) injection: INTRAVENOUS | @ 18:00:00 | Stop: 2022-10-13

## 2022-10-13 MED ADMIN — lidocaine (XYLOCAINE) 20 mg/mL (2 %) injection: INTRAVENOUS | @ 18:00:00 | Stop: 2022-10-13

## 2022-10-13 MED ADMIN — tacrolimus (PROGRAF) 1.5mg combo product: 1.5 mg | SUBLINGUAL | @ 01:00:00

## 2022-10-13 MED ADMIN — amlodipine (NORVASC) tablet 5 mg: 5 mg | ORAL | @ 01:00:00

## 2022-10-13 MED ADMIN — pantoprazole (Protonix) injection 40 mg: 40 mg | INTRAVENOUS | @ 01:00:00

## 2022-10-13 MED ADMIN — hydrALAZINE (APRESOLINE) tablet 50 mg: 50 mg | ORAL | @ 01:00:00

## 2022-10-13 MED ADMIN — micafungin (MYCAMINE) 150 mg in sodium chloride (NS) 0.9 % 100 mL IVPB: 150 mg | INTRAVENOUS | @ 14:00:00 | Stop: 2022-10-23

## 2022-10-13 MED ADMIN — sugammadex (BRIDION) injection: INTRAVENOUS | @ 19:00:00 | Stop: 2022-10-13

## 2022-10-13 MED ADMIN — heparin (porcine) 5,000 unit/mL injection 5,000 Units: 5000 [IU] | SUBCUTANEOUS | @ 10:00:00

## 2022-10-13 MED ADMIN — dexAMETHasone (DECADRON) 4 mg/mL injection: INTRAVENOUS | @ 19:00:00 | Stop: 2022-10-13

## 2022-10-13 MED ADMIN — ondansetron (ZOFRAN) injection: INTRAVENOUS | @ 19:00:00 | Stop: 2022-10-13

## 2022-10-13 MED ADMIN — menthol (COUGH DROPS) lozenge 1 lozenge: 1 | ORAL | @ 13:00:00

## 2022-10-13 MED ADMIN — heparin (porcine) 5,000 unit/mL injection 5,000 Units: 5000 [IU] | SUBCUTANEOUS | @ 02:00:00

## 2022-10-13 MED ADMIN — mycophenolate (CELLCEPT) 500 mg in dextrose 5 % 100 mL IVPB: 500 mg | INTRAVENOUS | @ 01:00:00

## 2022-10-13 MED ADMIN — sodium chloride irrigation (NS) 0.9 % irrigation solution: @ 18:00:00 | Stop: 2022-10-13

## 2022-10-13 MED ADMIN — succinylcholine (ANECTINE) injection: INTRAVENOUS | @ 18:00:00 | Stop: 2022-10-13

## 2022-10-13 MED ADMIN — mycophenolate (CELLCEPT) 500 mg in dextrose 5 % 100 mL IVPB: 500 mg | INTRAVENOUS | @ 13:00:00

## 2022-10-13 MED ADMIN — HYDROmorphone (PF) injection Syrg 0.25 mg: .25 mg | INTRAVENOUS | @ 23:00:00 | Stop: 2022-10-21

## 2022-10-13 MED ADMIN — amlodipine (NORVASC) tablet 5 mg: 5 mg | ORAL | @ 12:00:00

## 2022-10-13 MED ADMIN — bupivacaine LIPOSOMAL (PF) (EXPAREL) 266 mg, sodium chloride (NS) 0.9 % 1 mL infiltration injection: 266 mg | @ 19:00:00 | Stop: 2022-10-13

## 2022-10-13 MED ADMIN — carvedilol (COREG) tablet 25 mg: 25 mg | ORAL | @ 01:00:00

## 2022-10-13 MED ADMIN — menthol (COUGH DROPS) lozenge 1 lozenge: 1 | ORAL | @ 10:00:00

## 2022-10-13 MED ADMIN — pantoprazole (Protonix) injection 40 mg: 40 mg | INTRAVENOUS | @ 12:00:00

## 2022-10-13 MED ADMIN — lactated Ringers infusion: INTRAVENOUS | @ 17:00:00 | Stop: 2022-10-13

## 2022-10-13 MED ADMIN — levothyroxine (SYNTHROID) tablet 112 mcg: 112 ug | ORAL | @ 10:00:00

## 2022-10-13 MED ADMIN — EXPAREL ADMINISTERED WITHIN 96 HRS - NO BUPIVACAINE FOR 96 HOURS AFTER EXPAREL: 1 | @ 19:00:00 | Stop: 2022-10-17

## 2022-10-13 MED ADMIN — promethazine (PHENERGAN) injection 6.25 mg: 6.25 mg | INTRAVENOUS | @ 20:00:00 | Stop: 2022-10-13

## 2022-10-13 MED ADMIN — acetaminophen (OFIRMEV) 10 mg/mL injection 650 mg 65 mL: 650 mg | INTRAVENOUS | Stop: 2022-10-14

## 2022-10-13 MED ADMIN — meropenem (MERREM) 1 g in sodium chloride 0.9 % (NS) 100 mL IVPB-MBP: 1 g | INTRAVENOUS | @ 12:00:00 | Stop: 2022-10-19

## 2022-10-13 NOTE — Unmapped (Signed)
Disoriented to situation, forgetful. Restraints discontinued, following commands & not pulling lines. Family at bedside. RRT called in morning for concern of SBP >200. Medications altered & taking manual BPs, patient asymptomatic. NPO. No BM. Voiding. Up to chair. To CT scan in evening w/ ICU transport. NGT to [facility].       Problem: Adult Inpatient Plan of Care  Goal: Plan of Care Review  Outcome: Progressing  Goal: Patient-Specific Goal (Individualized)  Outcome: Progressing  Goal: Absence of Hospital-Acquired Illness or Injury  Outcome: Progressing  Intervention: Identify and Manage Fall Risk  Recent Flowsheet Documentation  Taken 10/12/2022 0800 by Hyman Bower, RN  Safety Interventions:   aspiration precautions   bleeding precautions   environmental modification   fall reduction program maintained   family at bedside   lighting adjusted for tasks/safety   low bed   nonskid shoes/slippers when out of bed  Intervention: Prevent Skin Injury  Recent Flowsheet Documentation  Taken 10/12/2022 1600 by Hyman Bower, RN  Positioning for Skin: Sitting in Chair  Device Skin Pressure Protection: absorbent pad utilized/changed  Skin Protection: adhesive use limited  Taken 10/12/2022 1200 by Hyman Bower, RN  Positioning for Skin: Sitting in Chair  Device Skin Pressure Protection: absorbent pad utilized/changed  Skin Protection: adhesive use limited  Taken 10/12/2022 0800 by Hyman Bower, RN  Positioning for Skin: Right  Device Skin Pressure Protection: absorbent pad utilized/changed  Skin Protection: adhesive use limited  Intervention: Prevent Infection  Recent Flowsheet Documentation  Taken 10/12/2022 0800 by Hyman Bower, RN  Infection Prevention:   cohorting utilized   hand hygiene promoted  Goal: Optimal Comfort and Wellbeing  Outcome: Progressing  Goal: Readiness for Transition of Care  Outcome: Progressing  Goal: Rounds/Family Conference  Outcome: Progressing     Problem: Infection  Goal: Absence of Infection Signs and Symptoms  Outcome: Progressing  Intervention: Prevent or Manage Infection  Recent Flowsheet Documentation  Taken 10/12/2022 0800 by Hyman Bower, RN  Infection Management: aseptic technique maintained  Isolation Precautions: enteric precautions maintained     Problem: Fall Injury Risk  Goal: Absence of Fall and Fall-Related Injury  Outcome: Progressing  Intervention: Promote Injury-Free Environment  Recent Flowsheet Documentation  Taken 10/12/2022 0800 by Hyman Bower, RN  Safety Interventions:   aspiration precautions   bleeding precautions   environmental modification   fall reduction program maintained   family at bedside   lighting adjusted for tasks/safety   low bed   nonskid shoes/slippers when out of bed     Problem: Self-Care Deficit  Goal: Improved Ability to Complete Activities of Daily Living  Outcome: Progressing     Problem: Comorbidity Management  Goal: Maintenance of Asthma Control  Outcome: Progressing  Goal: Maintenance of Behavioral Health Symptom Control  Outcome: Progressing  Goal: Maintenance of COPD Symptom Control  Outcome: Progressing  Goal: Blood Glucose Levels Within Targeted Range  Outcome: Progressing  Goal: Maintenance of Heart Failure Symptom Control  Outcome: Progressing  Goal: Blood Pressure in Desired Range  Outcome: Progressing  Goal: Maintenance of Osteoarthritis Symptom Control  Outcome: Progressing  Goal: Bariatric Home Regimen Maintained  Outcome: Progressing  Goal: Maintenance of Seizure Control  Outcome: Progressing     Problem: Skin Injury Risk Increased  Goal: Skin Health and Integrity  Outcome: Progressing  Intervention: Optimize Skin Protection  Recent Flowsheet Documentation  Taken 10/12/2022 1600 by Hyman Bower, RN  Pressure Reduction Techniques: frequent weight shift encouraged  Pressure Reduction Devices:  feet on footrest/footstool  Skin Protection: adhesive use limited  Taken 10/12/2022 1200 by Hyman Bower, RN  Pressure Reduction Techniques: frequent weight shift encouraged  Pressure Reduction Devices: feet on footrest/footstool  Skin Protection: adhesive use limited  Taken 10/12/2022 0800 by Hyman Bower, RN  Pressure Reduction Techniques: frequent weight shift encouraged  Head of Bed (HOB) Positioning: HOB at 60-90 degrees  Pressure Reduction Devices: pressure-redistributing mattress utilized  Skin Protection: adhesive use limited     Problem: Non-Violent Restraints  Goal: Patient will remain free of restraint events  Outcome: Progressing  Goal: Patient will remain free of physical injury  Outcome: Progressing

## 2022-10-13 NOTE — Unmapped (Signed)
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 found to have E coli bacteremia from a urinary source. hospital course complicated with developing ileus vs small bowel obstruction.    kidney transplant 08/06/22   creatinine improved from previous nadir/baseline 1.4-1.6 mg/dl. mild non oliguric AKI on admission resolved.  maintenance iv fluids while on NGT suction and unable to tolerate po intake    immunosuppression [High risk medical decision making for drug therapy requiring intensive monitoring for toxicity]  sublingual tacrolimus incr to 1.5mg  bid. 12hr tacrolimus goal 7-9 ng/ml.   - monitor tacrolimus level with am labs.  mycophenolate 500mg  bid  methylprednisolone 4mg  daily    E.coli bacteremia from complicated urinary tract infection +/-transplant pyelonephritis  meropenem 3/14-present x anticipated 2 week antibiotic course. immunocompromised infectious disease consult appreciated.  repeat blood cultures 3/16 no growth to date. perinephric fluid aspiration 3/19 no growth to date  plan outpatient referral to urogynecology for evaluation and mgmt recurrent urine tract infection    small bowel obstruction  NGT to low intermittent suction. serial imaging and abdominal exam. abdomen/pelvis ct with high grade small bowel obstruction and incr small bowel dilation.    general surgery consult following.     hypertension  isolated systolic hypertension with wide pulse pressure and low diastolic readings make mgmt challenging without causing lightheadedness and increasing risk for falls, organ hypoperfusion.    elevated blood pressure readings this am most likely d/t patient agitation and ?incorrect readings from automated cuff noted.    clonidine taper to 0.1mg  q.pm x3d then discontinuation encouraged as tolerated d/t fatigue and sedation associated with this medication.    hydralazine incr to 50mg  bid; this can be increased towards home dosing 100mg  bid as needed    infectious prophylaxis and monitoring:   CMV D+/R+, EBV D+/R+  valganciclovir 450mg  daily x23m 1-4/24   trimethoprim/sulfamethoxazole 80/400mg  3x/week x47m 1-7/24  micafungin 150mg  daily vs oral +/-esophageal candidiasis    Prince Rome, MD  Division of Nephrology and Hypertension  [facility]  10/12/2022  _______________________________________________________________________________    Kidney Transplant History:   Date of Transplant: 08/06/2022 (Kidney)  KDPI: 56%  Cold ischemic time: 877 minutes (14 hr 37 min). Warm ischemic time: 32 minutes  cPRA: 0%  HLA match: Blood type: Donor A1, Recipient A POS  ID: CMV D+/R+, EBV D+/R+  Native Kidney Disease: Diabetes              Native kidney biopsy: Diabetic nephropathy (done elsewhere)              Pre-transplant dialysis course: PD started July 2017, transitioned to HD October 2019 due to catheter dysfunction/inadequate dialysis  Induction: Basiliximab with steroid maintenance    subjective: transient confusion/agitation overnight. rapid response this am d/t elevated blood pressure readings in 240s with significant differential between automated radial 200s and calf measurement 160s. manual blood pressure in upper arm with systolic blood pressure in 160s.    abdomen pain and distension improved. no bowel mvmt x past 24hrs. no headache. no chest pain orthopnea or shortness of breath. no urine symptoms.    medications:   Current Facility-Administered Medications   Medication Dose Route Frequency    amlodipine (NORVASC) tablet 5 mg  5 mg Oral Q12H SCH    atorvastatin (LIPITOR)  tablet 10 mg  10 mg Oral Q24H SCH    calcium carbonate (TUMS) chewable tablet 200 mg of elem calcium  200 mg of elem calcium Oral TID PRN    carvedilol (COREG) tablet 25 mg  25 mg Oral Q12H SCH    cloNIDine HCL (CATAPRES) tablet 0.1 mg  0.1 mg Oral Nightly    dextrose (D10W) 10% bolus 125 mL  12.5 g Intravenous Q10 Min PRN    glucagon injection 1 mg  1 mg Intramuscular Once PRN    glucose chewable tablet 16 g  16 g Oral Q10 Min PRN    guaiFENesin (ROBITUSSIN) oral syrup  200 mg Oral Q4H PRN    heparin (porcine) 5,000 unit/mL injection 5,000 Units  5,000 Units Subcutaneous Q8H SCH    hydrALAZINE (APRESOLINE) tablet 25 mg  25 mg Oral BID    HYDROmorphone (PF) injection Syrg 0.25 mg  0.25 mg Intravenous Q4H PRN    insulin glargine (LANTUS) injection 6 Units  6 Units Subcutaneous Nightly    insulin lispro (HumaLOG) injection 0-20 Units  0-20 Units Subcutaneous ACHS    labetalol (NORMODYNE) injection  20 mg Intravenous Q4H PRN    levothyroxine (SYNTHROID) tablet 112 mcg  112 mcg Oral daily    magnesium oxide-Mg AA chelate (Magnesium Plus Protein) 2 tablet  2 tablet Oral BID    melatonin tablet 3 mg  3 mg Oral Nightly PRN    meropenem (MERREM) 1 g in sodium chloride 0.9 % (NS) 100 mL IVPB-MBP  1 g Intravenous Q12H    methylPREDNISolone sodium succinate (PF) (SOLU-Medrol) injection 5.2 mg  5.2 mg Intravenous Q24H    micafungin (MYCAMINE) 150 mg in sodium chloride (NS) 0.9 % 100 mL IVPB  150 mg Intravenous Q24H    mycophenolate (CELLCEPT) 500 mg in dextrose 5 % 100 mL IVPB  500 mg Intravenous Q12H    ondansetron (ZOFRAN-ODT) disintegrating tablet 4 mg  4 mg Oral Q8H PRN    Or    ondansetron (ZOFRAN) injection 4 mg  4 mg Intravenous Q8H PRN    pantoprazole (Protonix) EC tablet 40 mg  40 mg Oral Daily    phenol (CHLORASEPTIC) 1.4 % spray 2 spray  2 spray Mucous Membrane Q2H PRN    prochlorperazine (COMPAZINE) injection 10 mg  10 mg Intramuscular Q8H PRN    simethicone (MYLICON) chewable tablet 80 mg  80 mg Oral Q6H PRN    sodium chloride (NS) 0.9 % infusion  75 mL/hr Intravenous Continuous    sulfamethoxazole-trimethoprim (BACTRIM) 400-80 mg tablet 80 mg of trimethoprim  1 tablet Oral Once per day on Monday Wednesday Friday tacrolimus (PROGRAF) 1.5mg  combo product  1.5 mg Sublingual Daily    tacrolimus (PROGRAF) capsule 1 mg  1 mg Sublingual Nightly    valGANciclovir (VALCYTE) tablet 450 mg  450 mg Oral Every other day      ALLERGIES  Oxycodone and Valacyclovir      Physical Exam:  Vitals:    10/12/22 2109   BP: 148/52   Pulse: 77   Resp: 13   Temp: 98.3   SpO2: 96%     I/O this shift:  In: -   Out: 150 [Urine:150]    Intake/Output Summary (Last 24 hours) at 10/12/2022 2244  Last data filed at 10/12/2022 2000  Gross per 24 hour   Intake --   Output 1250 ml   Net -1250 ml     general: resting comfortably in bed no acute distress. NGT in place.  HEENT: anicteric sclera. +thrush  cv: RRR nl s1s2 +II/VI systolic murmur no s3/s4. no peripheral edema  lungs: no increased work of breathing, [place] oxygen in place  abdomen: soft non tender +mild distended. +bowel sounds diminished  skin: no visible lesions or rashes  neuro: alert oriented x3. non focal exam.    Test Results  Lab Results   Component Value Date    WBC 3.0 (L) 10/12/2022    HGB 9.7 (L) 10/12/2022    HCT 28.3 (L) 10/12/2022    PLT 106 (L) 10/12/2022       Lab Results   Component Value Date    NA 141 10/12/2022    K 4.5 10/12/2022    CL 112 (H) 10/12/2022    CO2 24.0 10/12/2022    BUN 33 (H) 10/12/2022    CREATININE 1.04 (H) 10/12/2022    GLU 121 (H) 10/12/2022    CALCIUM 8.4 (L) 10/12/2022    MG 1.9 10/12/2022    PHOS 2.2 (L) 10/12/2022       Lab Results   Component Value Date    BILITOT 0.4 10/12/2022    PROT 5.5 (L) 10/12/2022    ALBUMIN 3.0 (L) 10/12/2022    ALT <7 (L) 10/12/2022    AST 19 10/12/2022    ALKPHOS 45 (L) 10/12/2022     lactate 0.6    tacrolimus 10.5hr lvl 5.2 ng/ml    abdomen/pelvis ct 3/21: +high grade small bowel obstruction with increased small bowel dilation. ?closed loop obstruction.

## 2022-10-13 NOTE — Unmapped (Signed)
General Surgery Progress Note      Assessment:   72 y.o. female with complicated previous medical history status post DCD DDKT in 07/2022, hypertension, type 2 diabetes, hypothyroidism, who was admitted for GNR bacteremia. General surgery consulted 3/17 as patient was found to have dilated loops of small bowel with multiple transition points in the RLE on CT, concerning for SBO. Continues to have high output from NGT in conjunction with failed GG challenge. Repeat CT AP 3/21 with persistent findings of high-grade SBO with worsening bowel distention. Plan for OR today. Awaiting next-of-kin to arrive in-person this morning to obtain formal consent as patient is delirious and cannot provide consent at this time.     Plan:   -Continue NGT LIWS, NPO   -Case requested  -Consent to be obtained from next-of-kin upon arrival    Interval/Subjective:  NAEO. Endorsing abdominal pain. Remains delirious and refusing operative interventions. 1.2L bilious output from NGT.       Objective:     Vital signs in last 24 hours:  Temp:  [36.4 ??C (97.5 ??F)-37.1 ??C (98.7 ??F)] 37.1 ??C (98.7 ??F)  Heart Rate:  [67-84] 71  SpO2 Pulse:  [69-74] 71  Resp:  [13-22] 22  BP: (148-248)/(50-64) 154/62  MAP (mmHg):  [88-131] 95  SpO2:  [93 %-96 %] 95 %    Intake/Output last 24 hours:  I/O last 3 completed shifts:  In: 2137.5 [I.V.:212.5; Blood:475; NG/GT:300; IV Piggyback:1150]  Out: 2850 [Urine:350; Emesis/NG output:2500]    Physical Exam:  General: no acute distress; NGT in place with bilious drainage  Neuro: A/O x 1  Pulm: Normal respiratory effort; equal chest rise   Cardiac: regular rate and rhythm   Abdomen: Soft, tender to palpation over the RLQ        Data Review:    I have reviewed the labs and studies from the last 24 hours.      Imaging: Radiology studies were personally reviewed

## 2022-10-13 NOTE — Unmapped (Cosign Needed)
Internal Medicine (MEDW) Progress Note    Assessment & Plan:   Julie [name] is a 72 y.o. female whose presentation is complicated by DDKT, HTN, hypothyroidism, T2DM that presented to [facility] with fevers, nausea and vomiting, diarrhea found to have E. Coli bacteremia. Course c/b SBO, fluid overload.     Active Problems  E. Coli bacteremia, bacteruria iso immunocompromised status - Perinephric fluid collection - Fever, N/V/D- resolving  Patient presenting with 48-hour history of fevers, febrile in ED 100.4, 24-hour history of nausea/vomiting and diarrhea.  No leukocytosis on labs, though WBC is elevated from baseline (10.6 3/14 usually ~5-7) patient does have left shift with neutrophils of 9.7.  Unclear etiology most likely gastroenteritis given acute nausea vomiting diarrhea, could also be secondary to gram-negative bacteremia.  Lower suspicion of respiratory, urinary source given patient is asymptomatic. CMV, EMV (-). Bcx, Ucx (+) E.coli. UA 3/14 positive for leukocyte esterase and WBC. CTAP 3/14 showed perinephric collection consistent with previous renal US and c/f renal infection, possibly pyelonephritis was not definitive given lack of contrast. Repeat ultrasound TXP kidney shows 8.1cm hematoma, possibly communicating 2.2cm complex collection on upper pole of kidney. S/p VIR aspiration 3/19. Creatinine level of fluid collection is 1.5, potentially suggestive of a urinoma.    -ID consulted, appreciate assistance   -C diff (-), GIPP needs to be collected  -Continue meropenem  -3/16 Bcx NGTD    SBO   Evening 3/15 new onset abd pain, distention. Exam (-) peritonitis, (+) BS. KUB shows increasing small bowel dilatation up to 4.2 cm with relative decompression of the colon. CTAP 3/14 showed no evidence of SBO or inflammation of GI tract. Repeat CXR 3/16 suggests ileus related to left lower quadrant transplant. CTAP 3/16 shows concern for developing high grade small bowel obstruction d/t adhesions. Unable to assess for ischemia given lack of IV contrast. General surgery consulted, placed NGT with LIWS. Patient is NPO. No acute surgical intervention needed at this time. Serial lactates have been wnl. CTAP 3/21 confirms high grade SBO, closed loop of bowel. Trauma surgery aware.   - IV dilaudid 0.25mg  q4 PRN  - IV tylenol 1g q6 PRN  - NGT to LIWS; NPO  - PRN simethicone    DDKT in 2024 - SOB, volume overload  DCD, KDPI 56%, Basiliximab induction, CMV +/+, EBV +/+, standard anatomy. Post kidney transplant course has been notable for: E coli UTI.  Renal US 3/14 showed small new perinephric collection which may represent hematoma, complicated seroma, or abscess. Stable resistive indices in the renal transplant arteries, which are mildly elevated. Course c/b rapid response x2 for SOB, found to pulmonary edema and atelectasis. Has wide pulse pressure and refractory HTN controlled w/ several medications. Sensitive to volume shifts likely due to graft, possibly aortic stenosis, and current bacteremia.   -S/p IV lasix 80mg  x2  -Would gently diurese if desats  - Continue 75cc/hr NS  -Continue bactrim for PJP prophylaxis  -Continue valganciclovir for CMV prophylaxis  -Continue sublingual tacrolimus, PTD  -Continue IV cellcept 250mg  BID  -Continue IV methylprednisolone 4mg  daily  - Immunosuppression/prophylaxis stated above  -Transplant medicine and nephrology consulted, appreciate assistance    HTN  Systolics ranging from 150-200s. At home, HTN 160-170 with home medications. Reassuring in a sense that pt is not hypotensive from bacteremia. Working w/ txp nephrology in titrating medications.   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 Hgb 7.6 3/5 from 8.2 at admission. No bleeding noted. Platelets now 51 from 51 3/14. CTAP 3/14 showed hyperattenuation of the left ventricular myocardium suggestive of anemia. Iron panel 3/16 showed Fe 34, TIBC 173, and Fe sat of 20. Will defer iron repletion at this time. Hb 6.9 3/19, however likely spurious. Awaiting 3/20 labs.   -S/p 1u pRBC  -Daily CBC    T2DM  Last A1C 5.6.  Home regimen Lantus 12 units nightly, lispro 3 units with meals, with sliding scale.  Will decrease insulin regimen given poor p.o. intake. Glucose 668 on 3/19, likely spurious iso lab drawn while dextrose containing fluids were running.  - Holding Lantus 6 units nightly  -SSI  -Hold mealtime insulin    C/f thrush, candida esophagitis  Has had a sore throat and pain with swallowing since NGT insertion. Has white coating on tongue. Is at risk for candida given immunosuppression. ID would like to empirically cover.  - PRN chloraseptic spray  - PRN ice chips  - Continue micafungin 150mg  daily    Chronic Problems  Hypothyroidism: Continue Synthroid. Repeat TSH wnl.       The patient's presentation is complicated by the following clinically significant conditions requiring additional evaluation and treatment: - Thrombocytopenia POA requiring further investigation or monitor  - Chronic kidney disease POA requiring further investigation, treatment, or monitoring   - Hypokalemia POA requiring further investigation, treatment, or monitoring  - Acidosis POA requiring further investigation, treatment, or monitoring     Issues Impacting Complexity of Management:  -High risk of complications from pain and/or analgesia likely to result in delirium  -The patient is at high risk for the development of complications of volume overload due to the need to provide IV hydration for suspected hypovolemia in the setting of: kidney transplant    Checklist:  Diet: NPO  DVT PPx: Heparin 5000units q8h  Code Status: Full Code  Dispo: Stepdown status    Team Contact Information:   Primary Team: Internal Medicine (MEDW)  Primary Resident: Quentin Cornwall, MD  Resident's Pager: [phone] (Gen MedW Intern - Alvester Morin)    Interval History:   Refused CTAP multiple times, gave zyprexa 2.5mg  x1. Put in 2 point restraints. Rapid response called for systolic in 240s. Large differential found between radial and calf pressures, with latter showing systolic ~160. Pt remained asymptomatic, only c/o back pain at time of rapid. No SOB, no abdominal pain. Pt refused, could not comply with getting arterial line.     Objective:   Temp:  [36 ??C (96.8 ??F)-37.2 ??C (98.9 ??F)] 36.4 ??C (97.5 ??F)  Heart Rate:  [69-84] 84  SpO2 Pulse:  [69-77] 69  Resp:  [15-21] 21  BP: (151-248)/(30-64) 152/50  SpO2:  [93 %-99 %] 93 %    Gen: sitting up in bed, NAD  HENT: atraumatic, normocephalic, EOM grossly intact  Heart: RRR, systolic murmur  Lungs: bilateral crackles at lung bases, improved from prior  Abdomen: soft, distended, (+) TTP, (+) BS  Extremities: No edema, wwp  Neuro: alert, oriented, no focal deficits appreciated

## 2022-10-13 NOTE — Unmapped (Signed)
Tacrolimus Therapeutic Monitoring Pharmacy Note    Julie [name] is a 72 y.o. female continuing tacrolimus.     Indication: Kidney transplant     Date of Transplant: 08/06/2022      Prior Dosing Information: Current regimen 1.5 mg SL AM and 1.5 mg SL PM. Home regimen: PO Prograf 2mg  BID.       Source(s) of information used to determine prior to admission dosing: Home Medication List or Clinic Note    Goals:  Therapeutic Drug Levels  Tacrolimus trough goal: 7-9 ng/mL - goal confirmed with transplant nephrology attending 3/19     Additional Clinical Monitoring/Outcomes  Monitor renal function (SCr and urine output) and liver function (LFTs)  Monitor for signs/symptoms of adverse events (e.g., hyperglycemia, hyperkalemia, hypomagnesemia, hypertension, headache, tremor)    Results:   Tacrolimus level:  11.0 ng/mL, drawn appropriately. Of note, 3/19 evening dose missed due to nausea/patient refused.     Pharmacokinetic Considerations and Significant Drug Interactions:  Concurrent hepatotoxic medications: None identified  Concurrent CYP3A4 substrates/inhibitors: None identified  Concurrent nephrotoxic medications: None identified    Assessment/Plan:  Recommendedation(s)  After discussion with nephrology, level is supratherapeutic and will decrease PM dose to 1 mg SL.   Change current regimen to 1.5 mg SL in AM and 1 mg SL in PM.   Tacrolimus administration is ordered for 0800/2000. Tacrolimus level ordered for 0730 tomorrow.     Follow-up  Day levels ordered x4 days through 3/26.  A pharmacist will continue to monitor and recommend levels as appropriate    Please page service pharmacist with questions/clarifications.    Pamella Pert PharmD Candidate    Charlestine Massed, PharmD  PGY1 Pharmacy Resident - Ambulatory Care Track

## 2022-10-13 NOTE — Unmapped (Cosign Needed)
Internal Medicine (MEDW) Progress Note    Assessment & Plan:   Julie [name] is a 72 y.o. female whose presentation is complicated by DDKT, HTN, hypothyroidism, T2DM that presented to [facility] with fevers, nausea and vomiting, diarrhea found to have E. Coli bacteremia. Course c/b SBO, fluid overload.     Active Problems  E. Coli bacteremia, bacteruria iso immunocompromised status - Perinephric fluid collection - Fever, N/V/D- resolving  Patient presenting with 48-hour history of fevers, febrile in ED 100.4, 24-hour history of nausea/vomiting and diarrhea.  No leukocytosis on labs, though WBC is elevated from baseline (10.6 3/14 usually ~5-7) patient does have left shift with neutrophils of 9.7.  Unclear etiology most likely gastroenteritis given acute nausea vomiting diarrhea, could also be secondary to gram-negative bacteremia.  Lower suspicion of respiratory, urinary source given patient is asymptomatic. CMV, EMV (-). Bcx, Ucx (+) E.coli. UA 3/14 positive for leukocyte esterase and WBC. CTAP 3/14 showed perinephric collection consistent with previous renal US and c/f renal infection, possibly pyelonephritis was not definitive given lack of contrast. Repeat ultrasound TXP kidney shows 8.1cm hematoma, possibly communicating 2.2cm complex collection on upper pole of kidney. S/p VIR aspiration 3/19. Creatinine level of fluid collection is 1.5, potentially suggestive of a urinoma.    -ID consulted, appreciate assistance   -C diff (-), GIPP needs to be collected  -Continue meropenem  -3/16 Bcx NGTD    SBO   Evening 3/15 new onset abd pain, distention. Exam (-) peritonitis, (+) BS. KUB shows increasing small bowel dilatation up to 4.2 cm with relative decompression of the colon. CTAP 3/14 showed no evidence of SBO or inflammation of GI tract. Repeat CXR 3/16 suggests ileus related to left lower quadrant transplant. CTAP 3/16 shows concern for developing high grade small bowel obstruction d/t adhesions. Unable to assess for ischemia given lack of IV contrast. General surgery consulted, placed NGT with LIWS. Patient is NPO. No acute surgical intervention needed at this time. Serial lactates have been wnl. CTAP 3/21 confirms high grade SBO, closed loop of bowel. OR today 3/22 for exploratory laparotomy.   - IV dilaudid 0.25mg  q4 PRN  - NGT to LIWS; NPO  - PRN simethicone    DDKT in 2024 - SOB, volume overload  DCD, KDPI 56%, Basiliximab induction, CMV +/+, EBV +/+, standard anatomy. Post kidney transplant course has been notable for: E coli UTI.  Renal US 3/14 showed small new perinephric collection which may represent hematoma, complicated seroma, or abscess. Stable resistive indices in the renal transplant arteries, which are mildly elevated. Course c/b rapid response x2 for SOB, found to pulmonary edema and atelectasis. Has wide pulse pressure and refractory HTN controlled w/ several medications. Sensitive to volume shifts likely due to graft, possibly aortic stenosis, and current bacteremia.   -S/p IV lasix 80mg  x2  -Would gently diurese if desats  - Continue 75cc/hr NS  -Continue bactrim for PJP prophylaxis  -Continue valganciclovir for CMV prophylaxis  -Continue sublingual tacrolimus, PTD  -Continue IV cellcept 250mg  BID  -Continue IV methylprednisolone 4mg  daily  - Immunosuppression/prophylaxis stated above  -Transplant medicine and nephrology consulted, appreciate assistance    HTN  Systolics ranging from 150-200s. At home, HTN 160-170 with home medications. Reassuring in a sense that pt is not hypotensive from bacteremia. Working w/ txp nephrology in titrating medications.   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 Hgb 7.6 3/5 from 8.2 at admission. No bleeding noted. Platelets 51 from 51 3/14. CTAP 3/14 showed hyperattenuation of the left ventricular myocardium suggestive of anemia. Iron panel 3/16 showed Fe 34, TIBC 173, and Fe sat of 20. Will defer iron repletion at this time. Hb, PLT stable 3/22.   -S/p 1u pRBC  -Daily CBC    T2DM  Last A1C 5.6.  Home regimen Lantus 12 units nightly, lispro 3 units with meals, with sliding scale.  Will decrease insulin regimen given poor p.o. intake. Glucose 668 on 3/19, likely spurious iso lab drawn while dextrose containing fluids were running.  - Holding Lantus 6 units nightly  -SSI  -Hold mealtime insulin    C/f thrush, candida esophagitis  Has had a sore throat and pain with swallowing since NGT insertion. Has white coating on tongue. Is at risk for candida given immunosuppression. ID would like to empirically cover.  - PRN chloraseptic spray  - PRN ice chips  - Continue micafungin 150mg  daily    Chronic Problems  Hypothyroidism: Continue Synthroid. Repeat TSH wnl.       The patient's presentation is complicated by the following clinically significant conditions requiring additional evaluation and treatment: - Thrombocytopenia POA requiring further investigation or monitor  - Chronic kidney disease POA requiring further investigation, treatment, or monitoring   - Hypokalemia POA requiring further investigation, treatment, or monitoring  - Acidosis POA requiring further investigation, treatment, or monitoring     Issues Impacting Complexity of Management:  -High risk of complications from pain and/or analgesia likely to result in delirium  -The patient is at high risk for the development of complications of volume overload due to the need to provide IV hydration for suspected hypovolemia in the setting of: kidney transplant    Checklist:  Diet: NPO  DVT PPx: Heparin 5000units q8h  Code Status: Full Code  Dispo: Stepdown status    Team Contact Information:   Primary Team: Internal Medicine (MEDW)  Primary Resident: Quentin Cornwall, MD  Resident's Pager: [phone] (Gen MedW Intern - Alvester Morin)    Interval History:   Had discussion with patient and family about need for surgery for SBO. CT confirmed high grade SBO. On OR schedule for noon 3/22.     Objective:   Temp:  [36.4 ??C (97.5 ??F)-37.1 ??C (98.7 ??F)] 36.5 ??C (97.7 ??F)  Heart Rate:  [67-84] 75  SpO2 Pulse:  [69-75] 75  Resp:  [13-22] 17  BP: (148-168)/(50-62) 158/52  SpO2:  [93 %-97 %] 97 %    Gen: sitting up in bed, NAD  HENT: atraumatic, normocephalic, EOM grossly intact, NGT in place  Heart: RRR, systolic murmur  Lungs: breathing comfortably on RA  Abdomen: soft, distended, not TTP  Neuro: alert, oriented, no focal deficits appreciated

## 2022-10-13 NOTE — Unmapped (Cosign Needed)
IMMUNOCOMPROMISED HOST INFECTIOUS DISEASE CONSULT NOTE    Julie [name] is being seen in consultation at the request of Johnsie Cancel, MD for evaluation of UTI in transplant patient.    Assessment/Recommendations:    Julie [name] is a 72 y.o. 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 Had perinephric drain in place postoperatively, removed 08/30/22  - Immunosuppression: Tacrolimus (goal 7-9), MM 500F, Pred 5  - Prophylaxis: valganciclovir x 6 months (mod risk), TMP+SMX x 6 months     Pertinent comorbidities:  ESRD was on PD and HD  DM II (A1c 5.6 on 08/06/22)  CAD  HLD  SBO 10/07/22, managing with supportive care     Pertinent exposures:  Originally from [place]   Treated with ivermectin at the ID clinic in 2018.     Summary of pertinent prior infections:  #History of Shingles 2017  #04/2018 Dialysis fluid Serratia R: Ampicillin, Unasyn, Cefazolin,S: Ceftriaxone, Gentamicin, Levo, PipTazo, Tobra  #Donor urine cx with C. Dubliensis 08/05/22, <10K, negative recipient samples, negative donor blood cx, treated with 2 weeks PO fluconazole  #E. Coli bacteremia and UTI 08/21/22  - Ucx/Bcx 1/29 E. Coli, R amp, amp/sulb, TMP/SMX. Susc CFZ, cipro/levo, gent/tobra, nitrofurantoin, tetra  - Treated with vanc/CFP -> 3 weeks levofloxacin through 2/26 (ureteral stent removed 2/15)     Active infection:  # ESBL/FQ-R E. coli bacteremia and UTI 10/04/22, improving  # Perinephric fluid collection 10/05/22, re-accumulated after JP drain removal 08/30/22  3/12 developed fever and malaise at home, per family poor PO intake and diarrhea  - 3/14 Bcx 2/2 E. Coli, susc-dose-dependent CFP, R cipro/levo, susc erta/mero, tobra, gent (R TMP/SMX, tetra, susc nitrofurantoin, mino)              - 3/16 Bcx x 2 NGTD  - 3/14 UA 26 WBC, Ucx 10-50K E. Coli (no AST) - fosfomycin susc pending  - 3/14 Imaging:   Renal US 8.1 x 0.8 x 2.3 cm perinephric collection new from prior, considering small hematoma, complicated seroma, or less likely abscess  CT A/P without contrast: perinephric stranding, 5.3 x 2.0 cm ill-defined perinephric collection limited evaluation without contrast. No stones seen. 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  Follow-up yeast screen of thrush  Follow-up 3/19 culture of perinephric fluid collection obtained by VIR    Management  For E.  Coli UTI/bacteremia  Continue meropenem, renally dosed equivalent of 1g q8h, duration at least 2 weeks (START 10/05/22) but extended duration possible depending on pending microbiology and clinical course    For thrush and presumed candida esophagitis  Continue IV micafungin 150 mg IV daily (dosed for esophagitis)    Antimicrobial prophylaxis required for host deficiency: transplant immunosuppression  Viral: Valganciclovir  PJP: TMP/SMX MWF    Intensive toxicity monitoring for prescription antimicrobials   CBC w/diff at least once per week  CMP at least once per week  clinical assessments for rashes or other skin changes    The [facility] ID service will continue to follow.           Please page the ID Transplant/Liquid Oncology Fellow consult at [phone] with questions.  Patient discussed with Dr. Raylene Miyamoto.    Venita Sheffield, MD  [facility]    History of Present Illness:      External record(s): Primary team note: Increased antihypertensive regimen (SBP >200 yesterday) Consultant note(s): Surgery plan to consent next of kin for surgery on arrival-pt delirious .    Independent historian(s): no independent historian required.       Interval hx:    Patient reports throat pain w/ swallowing attributed to NGT insertion. Denies abdominal pain, dysuria.    Afebrile, BP up to 248/52 yesterday, better BP control this morning. Plan for OR today. WBC increased to 4.2. Cr increased to 1.27. Perinephric aspirate NGTD.    CT A/P yesterday with high grade SBO, increased small bowel dilation compared to porior. No pneumatosis but developing ischemia and closed loop obstruction can't be excluded (noncontrasted exam). Plan for OR today.    Remains on mero 1 g q8 Hr and mica 150 mg daily        Vital Signs last 24 hours:  Temp:  [36.4 ??C (97.5 ??F)-37.1 ??C (98.7 ??F)] 36.5 ??C (97.7 ??F)  Heart Rate:  [67-84] 75  SpO2 Pulse:  [69-75] 75  Resp:  [13-22] 17  BP: (148-231)/(50-64) 158/52  SpO2:  [93 %-97 %] 97 %    Physical Exam:  Patient Lines/Drains/Airways Status       Active Active Lines, Drains, & Airways       Name Placement date Placement time Site Days    NG/OG Tube Decompression 16 Fr. Right nostril 10/07/22  1300  Right nostril  5    Peripheral IV 10/10/22 Anterior;Proximal;Right Forearm 10/10/22  1943  Forearm  2    Peripheral IV 10/12/22 Anterior;Distal;Right;Upper Arm 10/12/22  1154  Arm  less than 1    Arteriovenous Fistula - Vein Graft  Access 08/24/16 Left;Upper Arm 08/24/16  --  Arm  2241                    Const [x]  vital signs above    []  NAD, non-toxic appearance []  Chronically ill-appearing, non-distressed  Sitting in chair, awake/alert in no distress      Eyes [x]  Lids normal bilaterally, conjunctiva anicteric and noninjected OU     [] PERRL  [] EOMI        ENMT [x]  Normal appearance of external nose and ears, no nasal discharge        []  MMM, no lesions on lips or gums []  No thrush, leukoplakia, oral lesions  []  Dentition good []  Edentulous []  Dental caries present  []  Hearing normal  []  TMs with good light reflexes bilaterally   No thrush today  NGT in place, on  low intermittent suction with yellow-green output in canister      Neck [x]  Neck of normal appearance and trachea midline        [x]  No thyromegaly, nodules, or tenderness   []  Full neck ROM        Lymph [x]  No LAD in neck     [x]  No LAD in supraclavicular area     []  No LAD in axillae   []  No LAD in epitrochlear chains     []  No LAD in inguinal areas        CV [x]  RRR            []  No peripheral edema     []  Pedal pulses intact   []  No abnormal heart sounds appreciated   []  Extremities WWP   Systoli murmur present      Resp []  Normal WOB at rest    []  No breathlessness with speaking, no coughing  []  CTA anteriorly    []  CTA posteriorly    Breathing comfortably      GI []  Normal inspection, NTND   []  NABS     []  No umbilical hernia on exam       []  No hepatosplenomegaly     []  Inspection of perineal and perianal areas normal  Distended, nontender, no bowel sounds auscultated      GU []  Normal external genitalia     [] No urinary catheter present in urethra   []  No CVA tenderness    []  No tenderness over renal allograft        MSK []  No clubbing or cyanosis of hands       []  No vertebral point tenderness  []  No focal tenderness or abnormalities on palpation of joints in RUE, LUE, RLE, or LLE        Skin [x]  No rashes, lesions, or ulcers of visualized skin     []  Skin warm and dry to palpation   LUE prior graft in place with thrill      Neuro [x]  Face expression symmetric  []  Sensation to light touch grossly intact throughout    [x]  Moves extremities equally    []  No tremor noted        []  CNs II-XII grossly intact     []  DTRs normal and symmetric throughout []  Gait unremarkable        Psych []  Appropriate affect       []  Fluent speech         []  Attentive, good eye contact  []  Oriented to person, place, time          []  Judgment and insight are appropriate   Alert, speech normal        Data for Medical Decision Making     (3/14) EKG QTcF 438    I discussed mgm't w/qualified health care professional(s) involved in case: with surgery and micro regarding samples for micro/path .    I reviewed CBC results (WBC 4.2 WNL), chemistry results (Cr 1.27 elevated (up from 1.04)), and micro result(s) (3/19 perinephric fluid collection aspirate NGTD).    I independently visualized/interpreted not done.       Recent Labs   Lab Units 10/13/22  0428 10/12/22  0716   WBC 10*9/L 4.2 3.0*   HEMOGLOBIN g/dL 16.1* 9.7*   PLATELET COUNT (1) 10*9/L 140* 106*   BUN mg/dL 36* 33* - 33*   CREATININE mg/dL 0.96* 0.45* - 4.09*   AST U/L  --  19   ALT U/L  --  <7*   BILIRUBIN TOTAL mg/dL  --  0.4   ALK PHOS U/L  --  45*   POTASSIUM mmol/L 4.5 4.5 - 4.5   MAGNESIUM mg/dL 2.1 1.9   PHOSPHORUS mg/dL 2.1* 2.2*   CALCIUM mg/dL 9.3 8.4* - 8.4*       Lab Results   Component Value Date    Tacrolimus, Trough 5.2 10/12/2022       Microbiology:  Microbiology Results (last day)       Procedure Component Value Date/Time Date/Time    Micro Add-on [[id number]] Collected: 10/05/22 1002    Lab Status: Final result Specimen: Urine from Clean Catch Updated: 10/12/22 1504    Aerobic/Anaerobic Culture [[id number]] Collected: 10/10/22 1118    Lab Status: Preliminary result Specimen: Aspirate from Kidney, Left Updated: 10/12/22 1037     Aerobic/Anaerobic Culture NO GROWTH TO DATE     Gram Stain Result 1+ Polymorphonuclear leukocytes      No organisms seen    Narrative:      Specimen Source: Kidney, Left            Imaging:  CT Abdomen Pelvis Wo Contrast    Result Date: 10/12/2022  EXAM: CT ABDOMEN PELVIS WO CONTRAST ACCESSION: [id number] UN CLINICAL INDICATION: 72 years old with SBO      COMPARISON: CT abdomen pelvis dated 10/07/2022.     TECHNIQUE: A spiral CT scan was obtained without IV contrast from the lung bases to the pubic symphysis.  Images were reconstructed in the axial plane. Coronal and sagittal reformatted images were also provided for further evaluation.     Evaluation of the solid organs and vasculature is limited in the absence of intravenous contrast.         FINDINGS:     LOWER CHEST: Trace bilateral pleural effusions. Bibasilar deep atelectasis. No lobar consolidation or pleural effusion. Cardiomegaly. There are tracheobronchial calcifications.     LIVER: Normal liver contour. The previously described hypoattenuation within the hepatic lobe is more vague within the hepatic segment 5/6 is too small to fully characterize on noncontrast CT (2:33). No intrahepatic biliary dilatation.     BILIARY: Probable biliary sludge. Gallbladder is not significantly distended. Mild pericholecystic stranding, unchanged from prior, nonspecific.     SPLEEN: Normal contour. Stable 1.6 cm hypodense lesion within the spleen.     PANCREAS: Normal pancreatic contour without signs of inflammation or gross ductal dilatation.     ADRENAL GLANDS: Normal appearance of the adrenal glands.     KIDNEYS/URETERS: Atrophic bilateral native kidneys. No contour deforming masses. Bilateral nonobstructive nephrolithiasis and/or vascular calcifications. No hydronephrosis.     Left lower quadrant transplanted kidney without contour deforming mass. No hydronephrosis. There are surgical clips versus calcifications within the transplanted kidney.     BLADDER: Incompletely distended urinary bladder. Calcifications along the posterior bladder wall, unchanged.     REPRODUCTIVE ORGANS: Prior hysterectomy. No adnexal masses. Scattered pelvic phleboliths.     GI TRACT: Enteric tube with tip terminating within the fundus of the stomach. There are multiple dilated fluid-filled loops of small bowel, appear to increased compared to prior exam measuring up to 3.8 cm. There are multiple possible transition points within the middle to lower right abdomen (2:83 and 2:89 and 2:83) which may be due to adhesive bands. No pneumatosis. There is mild swirling of the mesentery (2:82). No evidence of bowel wall thickening. Scattered colonic diverticulosis.     PERITONEUM/RETROPERITONEUM AND MESENTERY: There is no  free air. There is trace abdominal ascites and mild mesenteric edema within the right lower quadrant.     VASCULATURE: Normal caliber aorta. Otherwise, limited evaluation without contrast. Severe atherosclerotic calcifications of the abdominal aorta and its branches.     LYMPH NODES: No adenopathy.     BONES and SOFT TISSUES: Multilevel degenerative changes of the spine. Endplate changes at L3 with Schmorl's node, unchanged. No focal soft tissue lesions. Anterior abdominal wall scarring. Unchanged nodularities within the subcutaneous anterior abdominal wall can be related to injection granulomas.         --Persistent findings of high-grade small bowel obstruction, with increased small bowel dilation when compared to prior. No pneumatosis. Developing ischemia cannot be excluded on this unenhanced examination. Cannot entirely exclude closed-loop obstruction due to multiple apparent transition point.         ++++++++++++++++++++     The findings of this study were discussed via telephone with DRDr Myrtice Lauth of the medicine service by Dr. Benn Moulder on 10/12/2022 5:15 PM.     -----------------------------------------------    XR Abdomen 1 View    Result Date: 10/12/2022  EXAM: XR ABDOMEN 1 VIEW ACCESSION: [id number] UN     CLINICAL INDICATION: 72 years old with ABDOMINAL PAIN  -  UNSPECIFIED SITE      COMPARISON: KUB 10/11/2022 and prior.     TECHNIQUE: Portable supine views of the abdomen, 2 images(s)     FINDINGS: Unchanged position of nonweighted enteric tube. Redemonstration of pelvic surgical clips secondary to left renal transplant.     Unchanged appearance of multiple loops of small bowel some of which are air-filled others are opacified by oral contrast. There is similar degree of small bowel dilation with contrast retention.     Lung bases are clear.     No aggressive osseous lesions. Pelvic phleboliths.         position of esophagogastric  tube looped in the stomach with the tip directed towards the fundus.     Persistent gaseous distention and retention of contrast in multiple loops of mildly dilated small bowel measuring up to 4 cm         ECG 12 Lead    Result Date: 10/12/2022  NORMAL SINUS RHYTHM ST & T WAVE ABNORMALITY, CONSIDER LATERAL ISCHEMIA ABNORMAL ECG WHEN COMPARED WITH ECG OF 05-Oct-2022 13:08, NONSPECIFIC T WAVE ABNORMALITY NOW EVIDENT IN INFERIOR LEADS    XR Abdomen 1 View    Result Date: 10/11/2022  EXAM: XR ABDOMEN 1 VIEW ACCESSION: [id number] UN     CLINICAL INDICATION: 72 years old with INTESTINAL OBSTRUCTION      COMPARISON: KUB 10/11/2022 and prior.     TECHNIQUE: Portable supine views of the abdomen., 2 images(s)     FINDINGS: Unchanged position of nonweighted enteric tube.     Unchanged appearance of multiple loops of small bowel which are opacified by oral contrast. Unchanged degree of small bowel dilation. Contrast is demonstrated within the colon. Redemonstration of pelvic surgical clips in this patient with history of left renal transplant. Pelvic phleboliths.     Unchanged bibasilar atelectasis.         --Little interval change of bowel gas and contrast pattern. --Unchanged position of enteric tube.         IR Aspiration Hematoma Abscess    Result Date: 10/11/2022  EXAM: US-GUIDED PERCUTANEOUS ASPIRATION OF A FLUID COLLECTION  DATE: 10/10/2022 11:38 AM ACCESSION: [id number] UN DICTATED: 10/10/2022 4:15 PM INTERPRETATION LOCATION: [facility]     CLINICAL INDICATION:  72 years old Female: perinephric fluid collection, transplanted kidney     COMPARISON: CT abdomen/pelvis  CONSENT: Informed consent was obtained from the patient including a discussion of the alternatives, benefits, and risks including but not limited to infection, bleeding, need for additional procedure, and/or ICU admission.  PROCEDURE: The procedure was performed with the patient supine . Limited ultrasound images were obtained, demonstrating ill-defined fluid along the inferior margin of the transplant kidney. These images were saved and sent to PACS. A skin entry site was identified and was prepped and draped using all elements of maximal sterile barrier technique. Local anesthesia was achieved with lidocaine .  A 15 cm 18-G needle was inserted into the collection using ultrasound guidance. After entry into the fluid collection, the needle was aspirated. Aspiration of the needle returned 50 mL of serosanguineous fluid, and a specimen was sent for culture.  SEDATION: I personally spent 22 minutes, continuously monitoring the patient face-to-face during the administration of moderate sedation.  Radiology nurse was present for the duration of the procedure to assist in patient monitoring.  Pre and Post Sedation activities have been reviewed.          Successful ultrasound-guided aspiration of fluid along the inferior aspect of the left lower quadrant transplant kidney.     Attestation     Signer name: Benjamine Mola, MD     I attest that I was present for the procedure ????        ???? I reviewed the stored images and agree with the report as written.    XR Abdomen 1 View    Result Date: 10/11/2022  EXAM: XR ABDOMEN 1 VIEW ACCESSION: [id number] UN     CLINICAL INDICATION: 72 years old with ABDOMINAL PAIN  -  UNSPECIFIED SITE      COMPARISON: KUB 10/11/2022 and prior.     TECHNIQUE: Portable supine views of the abdomen, 2 images(s)     FINDINGS: Nonweighted enteric tube courses below the diaphragm with tip and sideport projecting over the expected region of the stomach.     Multiple loops of small bowel are opacified with oral contrast similar to prior. Degree of small bowel dilation is unchanged from prior. No contrast is seen within the colon. Pelvic surgical clips in this patient with history of left renal transplant. Pelvic phleboliths.     Similar bibasilar atelectasis.         --Redemonstration of multiple similarly dilated loops of small bowel with intraluminal contrast which is largely unchanged from prior. No contrast is demonstrated within colon at the time of examination. --Appropriate positioning of nonweighted enteric tube.         XR Abdomen Portable    Result Date: 10/11/2022  EXAM: XR ABDOMEN PORTABLE ACCESSION: [id number] UN     CLINICAL INDICATION: 72 years old with gastrograffin challenge scout ; OTHER      COMPARISON: KUB earlier same day.     TECHNIQUE: Supine view of the abdomen, 2 images(s)     FINDINGS: Unchanged position of esophagogastric tube. The tip and sideport are coiled projecting over the gastric bubble. Unchanged small bowel dilation. Calcifications projecting over the bilateral renal fossa. Pelvic surgical clips.         --Unchanged position of esophagogastric tube with tip and sideport coiled over the gastric bubble.     -- Stable degree of dilated small bowel.         XR Abdomen 1 View    Result Date: 10/11/2022  EXAM: XR ABDOMEN 1  VIEW ACCESSION: [id number] UN     CLINICAL INDICATION: 72 years old with FOREIGN BODY DIGESTIVE SYSTEM      COMPARISON: KUB yesterday.     TECHNIQUE: Supine view of the abdomen, 2 images(s)     FINDINGS: Interval advancement of esophagogastric tube. The tip and sideport project over the left upper quadrant and is slightly coiled.     Multiple loops of small bowel are opacified with oral contrast. The degree of dilation is similar to yesterday. There is no contrast opacifying the colon at the time of this exam. Pelvic surgical clips. Pelvic phleboliths. Bibasilar atelectasis.         --Multiple loops of small bowel are dilated similar in degree to yesterday, oral contrast does not opacify the colon at the time of this exam.     --Interval advancement of esophagogastric tube, projecting over the stomach.         XR Abdomen 1 View    Result Date: 10/10/2022  EXAM: XR ABDOMEN 1 VIEW ACCESSION: [id number] UN     CLINICAL INDICATION: 72 years old with NGT (CATHETER VASCULAR FIT & ADJ)      COMPARISON: KUB earlier today.     TECHNIQUE: Supine view of the abdomen, 1 images(s)     FINDINGS: Interval retraction of esophagogastric tube, the tip and sideport coiled over the gastric fundus. Similar degree of small bowel dilation. Calcifications overlying the bilateral renal fossa could represent vascular calcifications or renal calculi. Pelvic surgical loops.         Interval retraction of esophagogastric tube, curled over the gastric fundus. Stable mildly dilated loops of small bowel         XR Abdomen Portable    Result Date: 10/10/2022  EXAM: XR ABDOMEN PORTABLE ACCESSION: [id number] UN     CLINICAL INDICATION: 72 years old with Gastrograffin challenge ; OTHER      COMPARISON: CT abdomen/pelvis 10/07/2022     TECHNIQUE: Supine view of the abdomen, 2 images(s)     FINDINGS: Esophagogastric tube is coiled within the stomach and the tip and sideport project over the left upper quadrant/gastric fundus, recommend retraction by 10 cm. Dilated loops of small bowel measuring up to 4.9 cm,, similar compared with prior CT examination/nonobstructive. Calcifications projecting over the bilateral renal fossa may represent vascular calcifications or calculi. Pelvic surgical clips. Vascular calcifications.         Esophagogastric tube coiled in the stomach with tip pointing retrograde, consider retraction by approximately 10 cm for more optimal positioning.     Persistent dilated small bowel, consistent with suspected small bowel obstruction.

## 2022-10-13 NOTE — Unmapped (Signed)
Operative Note      Patient Name: Julie [name]  Date of Birth: 01/15/1951  MRN: [id number]  CSN: [id number]  Date of Service: 10/13/2022      Preoperative Diagnosis: Small bowel obstruction    Postoperative Diagnosis: Same     Procedure Performed:   Reduction of volvulus by laparotomy (CPT 44050)    Teaching Surgeon: Sallyanne Havers, MD    Resident Surgeon: Theo Dills, MD; Glenard Haring, MD    Performing Service:   Trauma  Surgeons and Role:     * Helyn Numbers, Marney Doctor, MD - Primary     * Fredrich Romans, MD - Resident - Assisting     * Delsa Bern, MD - Resident - Assisting    Anesthesiology: Anesthesiologist: Katharine Look, MD  Anesthesia Provider: Theophilus Bones, MD    Anesthesia: General     Specimens: None    Implants: None     Estimated Blood Loss: 25 mL    Drains: None    Complications: None     Indications for Surgery: Julie [name] is a 72 y.o. 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 The risks, benefits, and alternatives to surgery were discussed with the patient and her HCPOA, and informed consent was obtained.    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 Volvulus reduced and lysis of adhesions performed. All bowel appeared viable with peristalsis and improved perfusion after reduction of volvulus. TAPP block performed with Exparel.     Procedure Details:   The patient was identified in the preoperative holding area and brought to the operating room by anesthesia and the nursing staff.  An initial timeout was performed with members of the surgical team and anesthesiology team.  The patient was then placed on the operating room table in the supine position.  Anesthesia was administered successfully. Appropriate lines were placed, including a left IJ CVC by the anesthesia team. The patient was then clipped, prepped and draped in the usual sterile fashion. A second timeout was performed with all members of the surgical and Anesthesia teams to confirm procedure, laterality (if any), and preoperative antibiotics administration.    A midline laparotomy was made and the abdomen was entered atraumatically. A wound protector was placed and the dilated small bowel was eviscerated. We identified a large dilated segment of small intestine that had rotated around an intermesenteric adhesion, which was lysed sharply and allowed [place] to reduce the volvulus. We then ran the bowel from the ligament of treitz to the ileocecal valve. There was adhesive disease in the RLQ involving the terminal ileum which was favorable to adhesiolysis, so we decided to perform a sharp lysis of adhesions to fully free the entirety of the small intestine. We ran the intestine again from the ligament of treitz to the ileocecal valve and ensured that there were no enterotomies or serosal injuries. The obstructed bowel was becoming more pink and had peristalsis. The abdomen was swept to confirm no URFOs present. The abdomen was irrigated with 2L of warm saline. 30 ml of Exparel was placed under the peritoneum circumferential to the incision. The fascia was closed with #1 PDS. The subcutaneous tissue was irrigated and the skin was closed with staples. Dressings were placed.     The patient was extubated and taken  to the PACU in stable condition.     Dr. Helyn Numbers was present for key portions of the procedure.     Theo Dills, MD  General Surgery  PGY-7  Pager #: [phone]  10/13/2022

## 2022-10-13 NOTE — Unmapped (Signed)
Pt VSS this shift. Call bell within reach. Resting between cares. Telesitter at bedside overnight. NPO status maintained for OR today.     Alert but disoriented to situation. Poor judgement/safety awareness. Forgetful at times. RA with sats > 92%, lung sounds diminished. NSR per tele. BP taken manually overnight, WDL. Voiding with diminished urine output. Last BM: 3/20. NGT in place to [facility], see flowsheets for output. No complaints of pain overnight.     Problem: Adult Inpatient Plan of Care  Goal: Plan of Care Review  Outcome: Progressing  Goal: Patient-Specific Goal (Individualized)  Outcome: Progressing  Goal: Absence of Hospital-Acquired Illness or Injury  Outcome: Progressing  Intervention: Prevent Skin Injury  Recent Flowsheet Documentation  Taken 10/13/2022 0400 by Cheral Almas I, RN  Positioning for Skin: Supine/Back  Taken 10/13/2022 0200 by Cheral Almas I, RN  Positioning for Skin: Supine/Back  Goal: Optimal Comfort and Wellbeing  Outcome: Progressing  Goal: Readiness for Transition of Care  Outcome: Progressing  Goal: Rounds/Family Conference  Outcome: Progressing     Problem: Infection  Goal: Absence of Infection Signs and Symptoms  Outcome: Progressing     Problem: Fall Injury Risk  Goal: Absence of Fall and Fall-Related Injury  Outcome: Progressing     Problem: Self-Care Deficit  Goal: Improved Ability to Complete Activities of Daily Living  Outcome: Progressing     Problem: Comorbidity Management  Goal: Maintenance of Asthma Control  Outcome: Progressing  Goal: Maintenance of Behavioral Health Symptom Control  Outcome: Progressing  Goal: Maintenance of COPD Symptom Control  Outcome: Progressing  Goal: Blood Glucose Levels Within Targeted Range  Outcome: Progressing  Goal: Maintenance of Heart Failure Symptom Control  Outcome: Progressing  Goal: Blood Pressure in Desired Range  Outcome: Progressing  Goal: Maintenance of Osteoarthritis Symptom Control  Outcome: Progressing  Goal: Bariatric Home Regimen Maintained  Outcome: Progressing  Goal: Maintenance of Seizure Control  Outcome: Progressing     Problem: Skin Injury Risk Increased  Goal: Skin Health and Integrity  Outcome: Progressing  Intervention: Optimize Skin Protection  Recent Flowsheet Documentation  Taken 10/13/2022 0400 by Cheral Almas I, RN  Pressure Reduction Techniques: frequent weight shift encouraged  Pressure Reduction Devices: pressure-redistributing mattress utilized  Taken 10/13/2022 0200 by Cheral Almas I, RN  Pressure Reduction Techniques: frequent weight shift encouraged  Pressure Reduction Devices: pressure-redistributing mattress utilized     Problem: Non-Violent Restraints  Goal: Patient will remain free of restraint events  Outcome: Progressing  Goal: Patient will remain free of physical injury  Outcome: Progressing

## 2022-10-13 NOTE — Unmapped (Signed)
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 hospital course complicated with small bowel obstruction.    kidney transplant 08/06/22   mild non oliguric AKI resolved. baseline creatinine 1.2-1.4 mg/dl.   maintenance iv fluids while on NGT suction and unable to tolerate po intake    immunosuppression [High risk medical decision making for drug therapy requiring intensive monitoring for toxicity]  sublingual tacrolimus decr 1.5mg /1mg  am/pm. 12hr tacrolimus goal 7-9 ng/ml.   - monitor tacrolimus level with am labs.  mycophenolate 500mg  bid  methylprednisolone 4mg  daily    E.coli bacteremia from complicated urinary tract infection +/-transplant pyelonephritis  meropenem 3/14-27 x2 week course. immunocompromised infectious disease consult appreciated.  repeat blood cultures 3/16 no growth to date. perinephric fluid aspiration 3/19 no growth to date  plan outpatient referral to urogynecology for evaluation and mgmt recurrent urine tract infection    small bowel obstruction  general surgery consult plans exploratory surgery posted for 12-. abdominal imaging reviewed with transplant surgery who are in agreement with plan.    hypertension  isolated systolic hypertension with wide pulse pressure and low diastolic readings make mgmt challenging without causing lightheadedness and increasing risk for falls, organ hypoperfusion.    clonidine taper to 0.1mg  q.pm x3d then discontinuation encouraged as tolerated d/t fatigue and sedation associated with this medication.    hydralazine 50mg  bid; this dose can be increased towards home dosing 100mg  bid as needed.    infectious prophylaxis and monitoring:   CMV D+/R+, EBV D+/R+  valganciclovir 450mg  daily x67m 1-4/24   trimethoprim/sulfamethoxazole 80/400mg  3x/week x50m 1-7/24  micafungin 150mg  daily vs oral +/-esophageal candidiasis    Prince Rome, MD  Division of Nephrology and Hypertension  [facility]  10/13/2022  _______________________________________________________________________________    Kidney Transplant History:   Date of Transplant: 08/06/2022 (Kidney)  KDPI: 56%  Cold ischemic time: 877 minutes (14 hr 37 min). Warm ischemic time: 32 minutes  cPRA: 0%  HLA match: Blood type: Donor A1, Recipient A POS  ID: CMV D+/R+, EBV D+/R+  Native Kidney Disease: Diabetes              Native kidney biopsy: Diabetic nephropathy (done elsewhere)              Pre-transplant dialysis course: PD started July 2017, transitioned to HD October 2019 due to catheter dysfunction/inadequate dialysis  Induction: Basiliximab with steroid maintenance    subjective: no acute overnight events.     medications:   Current Facility-Administered Medications   Medication Dose Route Frequency    amlodipine (NORVASC) tablet 5 mg  5 mg Oral Q12H SCH    atorvastatin (LIPITOR) tablet 10 mg  10 mg Oral Q24H SCH    calcium carbonate (TUMS) chewable tablet 200 mg of elem calcium  200 mg of elem calcium Oral TID PRN    carvedilol (COREG) tablet 25 mg  25 mg Oral Q12H SCH    cloNIDine HCL (CATAPRES) tablet 0.1 mg  0.1 mg Oral Nightly    dextrose (D10W) 10% bolus 125 mL  12.5 g Intravenous Q10 Min PRN    glucagon injection 1 mg  1 mg Intramuscular Once PRN    glucose chewable tablet 16 g  16 g Oral Q10 Min PRN  guaiFENesin (ROBITUSSIN) oral syrup  200 mg Oral Q4H PRN    heparin (porcine) 5,000 unit/mL injection 5,000 Units  5,000 Units Subcutaneous Q8H SCH    hydrALAZINE (APRESOLINE) tablet 25 mg  25 mg Oral BID    HYDROmorphone (PF) injection Syrg 0.25 mg  0.25 mg Intravenous Q4H PRN    insulin glargine (LANTUS) injection 6 Units  6 Units Subcutaneous Nightly    insulin lispro (HumaLOG) injection 0-20 Units  0-20 Units Subcutaneous ACHS labetalol (NORMODYNE) injection  20 mg Intravenous Q4H PRN    levothyroxine (SYNTHROID) tablet 112 mcg  112 mcg Oral daily    magnesium oxide-Mg AA chelate (Magnesium Plus Protein) 2 tablet  2 tablet Oral BID    melatonin tablet 3 mg  3 mg Oral Nightly PRN    meropenem (MERREM) 1 g in sodium chloride 0.9 % (NS) 100 mL IVPB-MBP  1 g Intravenous Q12H    methylPREDNISolone sodium succinate (PF) (SOLU-Medrol) injection 5.2 mg  5.2 mg Intravenous Q24H    micafungin (MYCAMINE) 150 mg in sodium chloride (NS) 0.9 % 100 mL IVPB  150 mg Intravenous Q24H    mycophenolate (CELLCEPT) 500 mg in dextrose 5 % 100 mL IVPB  500 mg Intravenous Q12H    ondansetron (ZOFRAN-ODT) disintegrating tablet 4 mg  4 mg Oral Q8H PRN    Or    ondansetron (ZOFRAN) injection 4 mg  4 mg Intravenous Q8H PRN    pantoprazole (Protonix) EC tablet 40 mg  40 mg Oral Daily    phenol (CHLORASEPTIC) 1.4 % spray 2 spray  2 spray Mucous Membrane Q2H PRN    prochlorperazine (COMPAZINE) injection 10 mg  10 mg Intramuscular Q8H PRN    simethicone (MYLICON) chewable tablet 80 mg  80 mg Oral Q6H PRN    sodium chloride (NS) 0.9 % infusion  75 mL/hr Intravenous Continuous    sulfamethoxazole-trimethoprim (BACTRIM) 400-80 mg tablet 80 mg of trimethoprim  1 tablet Oral Once per day on Monday Wednesday Friday    tacrolimus (PROGRAF) 1.5mg  combo product  1.5 mg Sublingual Daily    tacrolimus (PROGRAF) capsule 1 mg  1 mg Sublingual Nightly    valGANciclovir (VALCYTE) tablet 450 mg  450 mg Oral Every other day      ALLERGIES  Oxycodone and Valacyclovir    physical exam:  Vitals:    10/13/22 0744   BP: 158/52   Pulse: 75   Resp: 17   Temp: 97.7   SpO2: 97%     Intake/Output Summary (Last 24 hours) at 10/13/2022 1035  Last data filed at 10/13/2022 0400  Gross per 24 hour   Intake 0 ml   Output 1450 ml   Net -1450 ml     general: seated in chair no acute distress. NGT in place.  HEENT: anicteric sclera.  cv: RRR nl s1s2 +II/VI systolic murmur no s3/s4. no peripheral edema  lungs: no increased work of breathing  abdomen: soft non tender mild distended. +bowel sounds diminished  skin: no visible lesions or rashes  neuro: alert oriented x3. non focal exam.    Test Results  Lab Results   Component Value Date    WBC 4.2 10/13/2022    HGB 10.3 (L) 10/13/2022    HCT 29.9 (L) 10/13/2022    PLT 140 (L) 10/13/2022     Lab Results   Component Value Date    NA 139 10/13/2022    K 4.5 10/13/2022    CL 107 10/13/2022    CO2 26.0  10/13/2022    BUN 36 (H) 10/13/2022    CREATININE 1.27 (H) 10/13/2022    GLU 105 10/13/2022    CALCIUM 9.3 10/13/2022    MG 2.1 10/13/2022    PHOS 2.1 (L) 10/13/2022      BILITOT 0.4 10/12/2022    PROT 5.5 (L) 10/12/2022    ALBUMIN 3.3 (L) 10/13/2022    ALT <7 (L) 10/12/2022    AST 19 10/12/2022    ALKPHOS 45 (L) 10/12/2022     venous lactate 0.9    tacrolimus lvl 11 ng/ml

## 2022-10-14 LAB — CBC
HEMATOCRIT: 26.5 % — ABNORMAL LOW (ref 34.0–44.0)
HEMOGLOBIN: 9 g/dL — ABNORMAL LOW (ref 11.3–14.9)
MEAN CORPUSCULAR HEMOGLOBIN CONC: 34.1 g/dL (ref 32.0–36.0)
MEAN CORPUSCULAR HEMOGLOBIN: 32.2 pg (ref 25.9–32.4)
MEAN CORPUSCULAR VOLUME: 94.4 fL (ref 77.6–95.7)
MEAN PLATELET VOLUME: 8.4 fL (ref 6.8–10.7)
PLATELET COUNT: 132 10*9/L — ABNORMAL LOW (ref 150–450)
RED BLOOD CELL COUNT: 2.81 10*12/L — ABNORMAL LOW (ref 3.95–5.13)
RED CELL DISTRIBUTION WIDTH: 15.2 % (ref 12.2–15.2)
WBC ADJUSTED: 3.3 10*9/L — ABNORMAL LOW (ref 3.6–11.2)

## 2022-10-14 LAB — RENAL FUNCTION PANEL
ALBUMIN: 2.7 g/dL — ABNORMAL LOW (ref 3.4–5.0)
ANION GAP: 11 mmol/L (ref 5–14)
BLOOD UREA NITROGEN: 45 mg/dL — ABNORMAL HIGH (ref 9–23)
BUN / CREAT RATIO: 30
CALCIUM: 8.1 mg/dL — ABNORMAL LOW (ref 8.7–10.4)
CHLORIDE: 108 mmol/L — ABNORMAL HIGH (ref 98–107)
CO2: 21 mmol/L (ref 20.0–31.0)
CREATININE: 1.48 mg/dL — ABNORMAL HIGH
EGFR CKD-EPI (2021) FEMALE: 37 mL/min/{1.73_m2} — ABNORMAL LOW (ref >=60)
GLUCOSE RANDOM: 130 mg/dL — ABNORMAL HIGH (ref 70–99)
PHOSPHORUS: 4.7 mg/dL (ref 2.4–5.1)
POTASSIUM: 5.3 mmol/L — ABNORMAL HIGH (ref 3.4–4.8)
SODIUM: 140 mmol/L (ref 135–145)

## 2022-10-14 LAB — MAGNESIUM: MAGNESIUM: 2 mg/dL (ref 1.6–2.6)

## 2022-10-14 LAB — TACROLIMUS LEVEL, TROUGH: TACROLIMUS, TROUGH: 10.4 ng/mL (ref 5.0–15.0)

## 2022-10-14 LAB — LACTATE, VENOUS, WHOLE BLOOD: LACTATE BLOOD VENOUS: 0.6 mmol/L (ref 0.5–1.8)

## 2022-10-14 MED ADMIN — methylPREDNISolone sodium succinate (PF) (SOLU-Medrol) injection 4 mg: 4 mg | INTRAVENOUS | @ 09:00:00

## 2022-10-14 MED ADMIN — methocarbamol (ROBAXIN) tablet 500 mg: 500 mg | ENTERAL | @ 16:00:00

## 2022-10-14 MED ADMIN — micafungin (MYCAMINE) 150 mg in sodium chloride (NS) 0.9 % 100 mL IVPB: 150 mg | INTRAVENOUS | @ 13:00:00 | Stop: 2022-10-23

## 2022-10-14 MED ADMIN — acetaminophen (OFIRMEV) 10 mg/mL injection 650 mg 65 mL: 650 mg | INTRAVENOUS | @ 21:00:00 | Stop: 2022-10-15

## 2022-10-14 MED ADMIN — menthol (COUGH DROPS) lozenge 1 lozenge: 1 | ORAL | @ 20:00:00

## 2022-10-14 MED ADMIN — heparin (porcine) 5,000 unit/mL injection 5,000 Units: 5000 [IU] | SUBCUTANEOUS | @ 02:00:00

## 2022-10-14 MED ADMIN — methocarbamol (ROBAXIN) tablet 500 mg: 500 mg | ENTERAL | @ 01:00:00

## 2022-10-14 MED ADMIN — insulin lispro (HumaLOG) injection 0-20 Units: 0-20 [IU] | SUBCUTANEOUS | @ 21:00:00

## 2022-10-14 MED ADMIN — methocarbamol (ROBAXIN) tablet 500 mg: 500 mg | ENTERAL | @ 09:00:00

## 2022-10-14 MED ADMIN — carvedilol (COREG) tablet 25 mg: 25 mg | ORAL | @ 01:00:00

## 2022-10-14 MED ADMIN — mycophenolate (CELLCEPT) 500 mg in dextrose 5 % 100 mL IVPB: 500 mg | INTRAVENOUS | @ 02:00:00

## 2022-10-14 MED ADMIN — methocarbamol (ROBAXIN) tablet 500 mg: 500 mg | ENTERAL | @ 21:00:00

## 2022-10-14 MED ADMIN — acetaminophen (OFIRMEV) 10 mg/mL injection 650 mg 65 mL: 650 mg | INTRAVENOUS | @ 09:00:00 | Stop: 2022-10-15

## 2022-10-14 MED ADMIN — pantoprazole (Protonix) injection 40 mg: 40 mg | INTRAVENOUS | @ 13:00:00

## 2022-10-14 MED ADMIN — hydrALAZINE (APRESOLINE) tablet 50 mg: 50 mg | ENTERAL | @ 13:00:00

## 2022-10-14 MED ADMIN — pantoprazole (Protonix) injection 40 mg: 40 mg | INTRAVENOUS | @ 01:00:00

## 2022-10-14 MED ADMIN — heparin (porcine) 5,000 unit/mL injection 5,000 Units: 5000 [IU] | SUBCUTANEOUS | @ 17:00:00

## 2022-10-14 MED ADMIN — tacrolimus (PROGRAF) capsule 1 mg: 1 mg | SUBLINGUAL | @ 01:00:00

## 2022-10-14 MED ADMIN — amlodipine (NORVASC) tablet 5 mg: 5 mg | GASTROENTERAL | @ 13:00:00

## 2022-10-14 MED ADMIN — hydrALAZINE (APRESOLINE) tablet 50 mg: 50 mg | ORAL | @ 01:00:00

## 2022-10-14 MED ADMIN — tacrolimus (PROGRAF) 1.5mg combo product: 1.5 mg | SUBLINGUAL | @ 13:00:00

## 2022-10-14 MED ADMIN — cloNIDine HCL (CATAPRES) tablet 0.1 mg: .1 mg | ORAL | @ 01:00:00

## 2022-10-14 MED ADMIN — amlodipine (NORVASC) tablet 5 mg: 5 mg | ORAL | @ 01:00:00

## 2022-10-14 MED ADMIN — meropenem (MERREM) 1 g in sodium chloride 0.9 % (NS) 100 mL IVPB-MBP: 1 g | INTRAVENOUS | @ 13:00:00 | Stop: 2022-10-19

## 2022-10-14 MED ADMIN — melatonin tablet 6 mg: 6 mg | GASTROENTERAL | @ 21:00:00

## 2022-10-14 MED ADMIN — carvedilol (COREG) tablet 25 mg: 25 mg | GASTROENTERAL | @ 13:00:00

## 2022-10-14 MED ADMIN — meropenem (MERREM) 1 g in sodium chloride 0.9 % (NS) 100 mL IVPB-MBP: 1 g | INTRAVENOUS | @ 02:00:00 | Stop: 2022-10-19

## 2022-10-14 MED ADMIN — gabapentin (NEURONTIN) capsule 100 mg: 100 mg | ENTERAL | @ 13:00:00

## 2022-10-14 MED ADMIN — melatonin tablet 6 mg: 6 mg | GASTROENTERAL | @ 01:00:00

## 2022-10-14 MED ADMIN — atorvastatin (LIPITOR) tablet 10 mg: 10 mg | GASTROENTERAL | @ 13:00:00

## 2022-10-14 MED ADMIN — insulin lispro (HumaLOG) injection 0-20 Units: 0-20 [IU] | SUBCUTANEOUS | @ 17:00:00

## 2022-10-14 MED ADMIN — heparin (porcine) 5,000 unit/mL injection 5,000 Units: 5000 [IU] | SUBCUTANEOUS | @ 09:00:00

## 2022-10-14 MED ADMIN — mycophenolate (CELLCEPT) 500 mg in dextrose 5 % 100 mL IVPB: 500 mg | INTRAVENOUS | @ 14:00:00

## 2022-10-14 MED ADMIN — levothyroxine (SYNTHROID) tablet 112 mcg: 112 ug | ORAL | @ 09:00:00 | Stop: 2022-10-14

## 2022-10-14 MED ADMIN — acetaminophen (OFIRMEV) 10 mg/mL injection 650 mg 65 mL: 650 mg | INTRAVENOUS | @ 04:00:00 | Stop: 2022-10-15

## 2022-10-14 MED ADMIN — acetaminophen (OFIRMEV) 10 mg/mL injection 650 mg 65 mL: 650 mg | INTRAVENOUS | @ 16:00:00 | Stop: 2022-10-15

## 2022-10-14 NOTE — Unmapped (Signed)
General Surgery Progress Note      Assessment:   72 y.o. female with complicated previous medical history status post DCD DDKT in 07/2022, hypertension, type 2 diabetes, hypothyroidism, who was admitted for GNR bacteremia. General surgery consulted 3/17 as patient was found to have dilated loops of small bowel with multiple transition points in the RLE on CT, concerning for SBO. Continues to have high output from NGT in conjunction with failed GG challenge. Repeat CT AP 3/21 with persistent findings of high-grade SBO with worsening bowel distention. She is s/p exploratory laparotomy and reduction and small bowel volvulus on 10/15/22.     Plan:   -Clamp trial for 6 hours, record output when placed back to suction. If less than 250cc, remove NGT and give CLD  -NPO, IVF  -Pain control     Interval/Subjective:  NAEO. 2BM overnight, NGT with 50ml output since operation. Pain controlled       Objective:     Vital signs in last 24 hours:  Temp:  [35.8 ??C (96.4 ??F)-36.8 ??C (98.2 ??F)] 36.6 ??C (97.8 ??F)  Heart Rate:  [60-70] 62  SpO2 Pulse:  [60-70] 62  Resp:  [14-22] 14  BP: (128-179)/(27-74) 144/30  MAP (mmHg):  [64-93] 69  SpO2:  [91 %-99 %] 97 %  BMI (Calculated):  [25.39] 25.39    Intake/Output last 24 hours:  I/O last 3 completed shifts:  In: 850 [I.V.:800; NG/GT:50]  Out: 2725 [Urine:650; Emesis/NG output:2050; Blood:25]    Physical Exam:  General: no acute distress; NGT in place    Neuro: A/O x 1  Pulm: Normal respiratory effort; equal chest rise   Cardiac: regular rate and rhythm   Abdomen: Soft, appropriately tender, distention improved. Island dressing overlying incision       Data Review:    I have reviewed the labs and studies from the last 24 hours.      Imaging: Radiology studies were personally reviewed

## 2022-10-15 LAB — CBC
HEMATOCRIT: 24.5 % — ABNORMAL LOW (ref 34.0–44.0)
HEMOGLOBIN: 8.3 g/dL — ABNORMAL LOW (ref 11.3–14.9)
MEAN CORPUSCULAR HEMOGLOBIN CONC: 33.8 g/dL (ref 32.0–36.0)
MEAN CORPUSCULAR HEMOGLOBIN: 32.1 pg (ref 25.9–32.4)
MEAN CORPUSCULAR VOLUME: 94.9 fL (ref 77.6–95.7)
MEAN PLATELET VOLUME: 7.9 fL (ref 6.8–10.7)
PLATELET COUNT: 138 10*9/L — ABNORMAL LOW (ref 150–450)
RED BLOOD CELL COUNT: 2.58 10*12/L — ABNORMAL LOW (ref 3.95–5.13)
RED CELL DISTRIBUTION WIDTH: 15.2 % (ref 12.2–15.2)
WBC ADJUSTED: 3.2 10*9/L — ABNORMAL LOW (ref 3.6–11.2)

## 2022-10-15 LAB — RENAL FUNCTION PANEL
ALBUMIN: 2.6 g/dL — ABNORMAL LOW (ref 3.4–5.0)
ANION GAP: 9 mmol/L (ref 5–14)
BLOOD UREA NITROGEN: 47 mg/dL — ABNORMAL HIGH (ref 9–23)
BUN / CREAT RATIO: 33
CALCIUM: 7.5 mg/dL — ABNORMAL LOW (ref 8.7–10.4)
CHLORIDE: 111 mmol/L — ABNORMAL HIGH (ref 98–107)
CO2: 22 mmol/L (ref 20.0–31.0)
CREATININE: 1.41 mg/dL — ABNORMAL HIGH
EGFR CKD-EPI (2021) FEMALE: 39 mL/min/{1.73_m2} — ABNORMAL LOW (ref >=60)
GLUCOSE RANDOM: 131 mg/dL (ref 70–179)
PHOSPHORUS: 3.5 mg/dL (ref 2.4–5.1)
POTASSIUM: 4.9 mmol/L — ABNORMAL HIGH (ref 3.4–4.8)
SODIUM: 142 mmol/L (ref 135–145)

## 2022-10-15 LAB — TACROLIMUS LEVEL, TROUGH: TACROLIMUS, TROUGH: 5.9 ng/mL (ref 5.0–15.0)

## 2022-10-15 LAB — LACTATE, VENOUS, WHOLE BLOOD: LACTATE BLOOD VENOUS: 0.6 mmol/L (ref 0.5–1.8)

## 2022-10-15 LAB — MAGNESIUM: MAGNESIUM: 2 mg/dL (ref 1.6–2.6)

## 2022-10-15 MED ADMIN — atorvastatin (LIPITOR) tablet 10 mg: 10 mg | GASTROENTERAL | @ 13:00:00

## 2022-10-15 MED ADMIN — meropenem (MERREM) 1 g in sodium chloride 0.9 % (NS) 100 mL IVPB-MBP: 1 g | INTRAVENOUS | @ 13:00:00 | Stop: 2022-10-19

## 2022-10-15 MED ADMIN — methocarbamol (ROBAXIN) tablet 500 mg: 500 mg | ENTERAL

## 2022-10-15 MED ADMIN — heparin (porcine) 5,000 unit/mL injection 5,000 Units: 5000 [IU] | SUBCUTANEOUS | @ 20:00:00

## 2022-10-15 MED ADMIN — carvedilol (COREG) tablet 25 mg: 25 mg | GASTROENTERAL | @ 13:00:00

## 2022-10-15 MED ADMIN — methocarbamol (ROBAXIN) tablet 500 mg: 500 mg | ENTERAL | @ 20:00:00

## 2022-10-15 MED ADMIN — oxyCODONE (ROXICODONE) immediate release tablet 5 mg: 5 mg | ENTERAL | @ 04:00:00 | Stop: 2022-10-27

## 2022-10-15 MED ADMIN — acetaminophen (OFIRMEV) 10 mg/mL injection 650 mg 65 mL: 650 mg | INTRAVENOUS | @ 17:00:00 | Stop: 2022-10-15

## 2022-10-15 MED ADMIN — acetaminophen (OFIRMEV) 10 mg/mL injection 650 mg 65 mL: 650 mg | INTRAVENOUS | @ 04:00:00 | Stop: 2022-10-15

## 2022-10-15 MED ADMIN — heparin (porcine) 5,000 unit/mL injection 5,000 Units: 5000 [IU] | SUBCUTANEOUS | @ 10:00:00

## 2022-10-15 MED ADMIN — heparin (porcine) 5,000 unit/mL injection 5,000 Units: 5000 [IU] | SUBCUTANEOUS | @ 02:00:00

## 2022-10-15 MED ADMIN — mycophenolate (CELLCEPT) 500 mg in dextrose 5 % 100 mL IVPB: 500 mg | INTRAVENOUS | @ 15:00:00

## 2022-10-15 MED ADMIN — insulin lispro (HumaLOG) injection 0-20 Units: 0-20 [IU] | SUBCUTANEOUS | @ 17:00:00

## 2022-10-15 MED ADMIN — levothyroxine (SYNTHROID) tablet 112 mcg: 112 ug | ENTERAL | @ 10:00:00

## 2022-10-15 MED ADMIN — methylPREDNISolone sodium succinate (PF) (SOLU-Medrol) injection 4 mg: 4 mg | INTRAVENOUS | @ 10:00:00

## 2022-10-15 MED ADMIN — pantoprazole (Protonix) injection 40 mg: 40 mg | INTRAVENOUS | @ 13:00:00

## 2022-10-15 MED ADMIN — menthol (COUGH DROPS) lozenge 1 lozenge: 1 | ORAL | @ 10:00:00

## 2022-10-15 MED ADMIN — pantoprazole (Protonix) injection 40 mg: 40 mg | INTRAVENOUS

## 2022-10-15 MED ADMIN — gabapentin (NEURONTIN) capsule 100 mg: 100 mg | ENTERAL | @ 13:00:00 | Stop: 2022-10-15

## 2022-10-15 MED ADMIN — menthol (COUGH DROPS) lozenge 1 lozenge: 1 | ORAL

## 2022-10-15 MED ADMIN — hydrALAZINE (APRESOLINE) tablet 50 mg: 50 mg | ENTERAL | @ 13:00:00

## 2022-10-15 MED ADMIN — valGANciclovir (VALCYTE) tablet 450 mg: 450 mg | ORAL | @ 13:00:00

## 2022-10-15 MED ADMIN — mycophenolate (CELLCEPT) 500 mg in dextrose 5 % 100 mL IVPB: 500 mg | INTRAVENOUS | @ 01:00:00

## 2022-10-15 MED ADMIN — amlodipine (NORVASC) tablet 5 mg: 5 mg | GASTROENTERAL | @ 13:00:00

## 2022-10-15 MED ADMIN — micafungin (MYCAMINE) 150 mg in sodium chloride (NS) 0.9 % 100 mL IVPB: 150 mg | INTRAVENOUS | @ 14:00:00 | Stop: 2022-10-23

## 2022-10-15 MED ADMIN — acetaminophen (OFIRMEV) 10 mg/mL injection 650 mg 65 mL: 650 mg | INTRAVENOUS | @ 22:00:00 | Stop: 2022-10-15

## 2022-10-15 MED ADMIN — insulin lispro (HumaLOG) injection 0-20 Units: 0-20 [IU] | SUBCUTANEOUS

## 2022-10-15 MED ADMIN — gabapentin (NEURONTIN) capsule 100 mg: 100 mg | ENTERAL

## 2022-10-15 MED ADMIN — methocarbamol (ROBAXIN) tablet 500 mg: 500 mg | ENTERAL | @ 17:00:00

## 2022-10-15 MED ADMIN — melatonin tablet 6 mg: 6 mg | GASTROENTERAL | @ 22:00:00

## 2022-10-15 MED ADMIN — tacrolimus (PROGRAF) 1.5mg combo product: 1.5 mg | SUBLINGUAL | @ 13:00:00

## 2022-10-15 MED ADMIN — tacrolimus (PROGRAF) capsule 1 mg: 1 mg | SUBLINGUAL

## 2022-10-15 MED ADMIN — methocarbamol (ROBAXIN) tablet 500 mg: 500 mg | ENTERAL | @ 10:00:00

## 2022-10-15 MED ADMIN — meropenem (MERREM) 1 g in sodium chloride 0.9 % (NS) 100 mL IVPB-MBP: 1 g | INTRAVENOUS | Stop: 2022-10-19

## 2022-10-15 MED ADMIN — acetaminophen (OFIRMEV) 10 mg/mL injection 650 mg 65 mL: 650 mg | INTRAVENOUS | @ 10:00:00 | Stop: 2022-10-15

## 2022-10-15 MED ADMIN — hydrALAZINE (APRESOLINE) tablet 50 mg: 50 mg | ENTERAL

## 2022-10-15 MED ADMIN — acetaminophen (OFIRMEV) 10 mg/mL injection 650 mg 65 mL: 650 mg | INTRAVENOUS | @ 13:00:00 | Stop: 2022-10-15

## 2022-10-15 NOTE — Unmapped (Signed)
IMMUNOCOMPROMISED HOST INFECTIOUS DISEASE CONSULT NOTE    Julie [name] is being seen in consultation at the request of Suella Broad, MD for evaluation of UTI in transplant patient.    Assessment/Recommendations:    Julie [name] is a 72 y.o. 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 Had perinephric drain in place postoperatively, removed 08/30/22  - Immunosuppression: Tacrolimus (goal 7-9), MM 500F, Pred 5  - Prophylaxis: valganciclovir x 6 months (mod risk), TMP+SMX x 6 months     Pertinent comorbidities:  ESRD was on PD and HD  DM II (A1c 5.6 on 08/06/22)  CAD  HLD  SBO 10/07/22 s/p ex lap 10/13/22 w/ lysis of adhesions     Pertinent exposures:  Originally from [place]   Treated with ivermectin at the ID clinic in 2018.     Summary of pertinent prior infections:  #History of Shingles 2017  #04/2018 Dialysis fluid Serratia R: Ampicillin, Unasyn, Cefazolin,S: Ceftriaxone, Gentamicin, Levo, PipTazo, Tobra  #Donor urine cx with C. Dubliensis 08/05/22, <10K, negative recipient samples, negative donor blood cx, treated with 2 weeks PO fluconazole  #E. Coli bacteremia and UTI 08/21/22  - Ucx/Bcx 1/29 E. Coli, R amp, amp/sulb, TMP/SMX. Susc CFZ, cipro/levo, gent/tobra, nitrofurantoin, tetra  - Treated with vanc/CFP -> 3 weeks levofloxacin through 2/26 (ureteral stent removed 2/15)     Active infection:  # ESBL/FQ-R E. coli bacteremia and UTI 10/04/22, improving  # Perinephric fluid collection 10/05/22, re-accumulated after JP drain removal 08/30/22  3/12 developed fever and malaise at home, per family poor PO intake and diarrhea  - 3/14 Bcx 2/2 E. Coli, susc-dose-dependent CFP, R cipro/levo, susc erta/mero, tobra, gent (R TMP/SMX, tetra, susc nitrofurantoin, mino) - 3/16 Bcx x 2 NGTD  - 3/14 UA 26 WBC, Ucx 10-50K E. Coli (no AST) - fosfomycin susc pending  - 3/14 Imaging:   Renal US 8.1 x 0.8 x 2.3 cm perinephric collection new from prior, considering small hematoma, complicated seroma, or less likely abscess  CT A/P without contrast: perinephric stranding, 5.3 x 2.0 cm ill-defined perinephric collection limited evaluation without contrast. No stones seen. 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 Coli UTI/bacteremia  Continue meropenem, renally dosed equivalent of 1g q8h, duration at least 2 weeks (START 10/05/22) but extended duration possible depending on pending microbiology and clinical course  For thrush and presumed candida esophagitis  Continue IV micafungin 150 mg IV daily (dosed for esophagitis)    Antimicrobial prophylaxis required for host deficiency: transplant immunosuppression  Viral: Valganciclovir  PJP: TMP/SMX MWF    Intensive toxicity monitoring for prescription antimicrobials   CBC w/diff at least once per week  CMP at least once per week  clinical assessments for rashes or other skin changes    The [facility] ID service will continue to follow.           Please page the ID Transplant/Liquid Oncology Fellow consult at [phone] with questions.  Patient discussed with Dr. Raylene Miyamoto.    Venita Sheffield, MD   Division of Infectious Diseases    History of Present Illness:      External record(s): Procedure/op note(s) lysis of adhesions w/ improvement in bowel appearance-no appearent ischemia of bowel .    Independent historian(s): no independent historian required.       Interval hx:    Patient reports feeling thirsty and hungry. Still not passing gas postop. Denies fever, dysuria, abdominal pain.    Remains on mero 1 g q8 Hr and mica 150 mg daily        Vital Signs last 24 hours:  Temp:  [36.6 ??C (97.8 ??F)-36.9 ??C (98.5 ??F)] 36.9 ??C (98.5 ??F)  Heart Rate:  [60-78] 78  SpO2 Pulse:  [60-76] 76  Resp:  [14-27] 22  BP: (130-185)/(24-49) 150/24  MAP (mmHg):  [64-78] 68  SpO2:  [95 %-97 %] 95 %    Physical Exam:  Patient Lines/Drains/Airways Status       Active Active Lines, Drains, & Airways       Name Placement date Placement time Site Days    CVC Triple Lumen 10/13/22 Non-tunneled Internal jugular 10/13/22  1345  Internal jugular  1    NG/OG Tube Decompression 16 Fr. Right nostril 10/07/22  1300  Right nostril  7    External Urinary Device 10/13/22 10/13/22  1700  -- 1    Arteriovenous Fistula - Vein Graft  Access 08/24/16 Left;Upper Arm 08/24/16  --  Arm  2242                    Const [x]  vital signs above    []  NAD, non-toxic appearance []  Chronically ill-appearing, non-distressed  Laying in bed, awake/alert in no distress      Eyes [x]  Lids normal bilaterally, conjunctiva anicteric and noninjected OU     [] PERRL  [] EOMI        ENMT [x]  Normal appearance of external nose and ears, no nasal discharge        []  MMM, no lesions on lips or gums []  No thrush, leukoplakia, oral lesions  []  Dentition good []  Edentulous []  Dental caries present  []  Hearing normal  []  TMs with good light reflexes bilaterally   NGT in place      Neck [x]  Neck of normal appearance and trachea midline        [x]  No thyromegaly, nodules, or tenderness   []  Full neck ROM        Lymph [x]  No LAD in neck     [x]  No LAD in supraclavicular area     []  No LAD in axillae   []  No LAD in epitrochlear chains     []  No LAD in inguinal areas        CV [x]  RRR            []   No peripheral edema     []  Pedal pulses intact   []  No abnormal heart sounds appreciated   []  Extremities WWP   Systoli murmur present      Resp []  Normal WOB at rest    []  No breathlessness with speaking, no coughing  []  CTA anteriorly    []  CTA posteriorly    Breathing comfortably      GI []  Normal inspection, NTND   []  NABS     []  No umbilical hernia on exam       []  No hepatosplenomegaly     []  Inspection of perineal and perianal areas normal  Distended, nontender, no bowel sounds auscultated, midline incision w/ dressing c/d/i      GU []  Normal external genitalia     [] No urinary catheter present in urethra   []  No CVA tenderness    []  No tenderness over renal allograft        MSK []  No clubbing or cyanosis of hands       []  No vertebral point tenderness  []  No focal tenderness or abnormalities on palpation of joints in RUE, LUE, RLE, or LLE        Skin [x]  No rashes, lesions, or ulcers of visualized skin     []  Skin warm and dry to palpation   LUE prior graft in place with thrill      Neuro [x]  Face expression symmetric  []  Sensation to light touch grossly intact throughout    [x]  Moves extremities equally    []  No tremor noted        []  CNs II-XII grossly intact     []  DTRs normal and symmetric throughout []  Gait unremarkable        Psych []  Appropriate affect       []  Fluent speech         []  Attentive, good eye contact  []  Oriented to person, place, time          []  Judgment and insight are appropriate   Alert, speech normal        Data for Medical Decision Making     (3/14) EKG QTcF 438    I discussed mgm't w/qualified health care professional(s) involved in case: with surgery and micro regarding samples for micro/path .    I reviewed CBC results (WBC 4.2 WNL), chemistry results (Cr 1.27 elevated (up from 1.04)), and micro result(s) (3/19 perinephric fluid collection aspirate NGTD).    I independently visualized/interpreted not done.       Recent Labs   Lab Units 10/14/22  0547 10/13/22  0428 10/12/22  0716   WBC 10*9/L 3.3*   < > 3.0*   HEMOGLOBIN g/dL 9.0*   < > 9.7*   PLATELET COUNT (1) 10*9/L 132*   < > 106*   BUN mg/dL 45*   < > 33* - 33*   CREATININE mg/dL 1.61*   < > 0.96* - 0.45*   AST U/L  --   --  19   ALT U/L  --   --  <7*   BILIRUBIN TOTAL mg/dL  --   --  0.4   ALK PHOS U/L  --   --  45*   POTASSIUM mmol/L 5.3*   < > 4.5 - 4.5   MAGNESIUM mg/dL 2.0   < > 1.9   PHOSPHORUS mg/dL 4.7   < > 2.2*   CALCIUM mg/dL 8.1*   < > 8.4* - 8.4*    < > =  values in this interval not displayed.       Lab Results   Component Value Date    Tacrolimus, Trough 10.4 10/14/2022       Microbiology:  Microbiology Results (last day)       Procedure Component Value Date/Time Date/Time    Micro Add-on [[id number]] Collected: 10/05/22 1002    Lab Status: Final result Specimen: Urine from Clean Catch Updated: 10/12/22 1504    Aerobic/Anaerobic Culture [[id number]] Collected: 10/10/22 1118    Lab Status: Preliminary result Specimen: Aspirate from Kidney, Left Updated: 10/12/22 1037     Aerobic/Anaerobic Culture NO GROWTH TO DATE     Gram Stain Result 1+ Polymorphonuclear leukocytes      No organisms seen    Narrative:      Specimen Source: Kidney, Left            Imaging:  CT Abdomen Pelvis Wo Contrast    Result Date: 10/12/2022  EXAM: CT ABDOMEN PELVIS WO CONTRAST ACCESSION: [id number] UN CLINICAL INDICATION: 72 years old with SBO      COMPARISON: CT abdomen pelvis dated 10/07/2022.     TECHNIQUE: A spiral CT scan was obtained without IV contrast from the lung bases to the pubic symphysis.  Images were reconstructed in the axial plane. Coronal and sagittal reformatted images were also provided for further evaluation.     Evaluation of the solid organs and vasculature is limited in the absence of intravenous contrast.         FINDINGS:     LOWER CHEST: Trace bilateral pleural effusions. Bibasilar deep atelectasis. No lobar consolidation or pleural effusion. Cardiomegaly. There are tracheobronchial calcifications.     LIVER: Normal liver contour. The previously described hypoattenuation within the hepatic lobe is more vague within the hepatic segment 5/6 is too small to fully characterize on noncontrast CT (2:33). No intrahepatic biliary dilatation.     BILIARY: Probable biliary sludge. Gallbladder is not significantly distended. Mild pericholecystic stranding, unchanged from prior, nonspecific.     SPLEEN: Normal contour. Stable 1.6 cm hypodense lesion within the spleen.     PANCREAS: Normal pancreatic contour without signs of inflammation or gross ductal dilatation.     ADRENAL GLANDS: Normal appearance of the adrenal glands.     KIDNEYS/URETERS: Atrophic bilateral native kidneys. No contour deforming masses. Bilateral nonobstructive nephrolithiasis and/or vascular calcifications. No hydronephrosis.     Left lower quadrant transplanted kidney without contour deforming mass. No hydronephrosis. There are surgical clips versus calcifications within the transplanted kidney.     BLADDER: Incompletely distended urinary bladder. Calcifications along the posterior bladder wall, unchanged.     REPRODUCTIVE ORGANS: Prior hysterectomy. No adnexal masses. Scattered pelvic phleboliths.     GI TRACT: Enteric tube with tip terminating within the fundus of the stomach. There are multiple dilated fluid-filled loops of small bowel, appear to increased compared to prior exam measuring up to 3.8 cm. There are multiple possible transition points within the middle to lower right abdomen (2:83 and 2:89 and 2:83) which may be due to adhesive bands. No pneumatosis. There is mild swirling of the mesentery (2:82). No evidence of bowel wall thickening. Scattered colonic diverticulosis.     PERITONEUM/RETROPERITONEUM AND MESENTERY: There is no free air. There is trace abdominal ascites and mild mesenteric edema within the right lower quadrant.     VASCULATURE: Normal caliber aorta. Otherwise, limited evaluation without contrast. Severe atherosclerotic calcifications of the abdominal aorta and its branches.     LYMPH NODES: No adenopathy.  BONES and SOFT TISSUES: Multilevel degenerative changes of the spine. Endplate changes at L3 with Schmorl's node, unchanged. No focal soft tissue lesions. Anterior abdominal wall scarring. Unchanged nodularities within the subcutaneous anterior abdominal wall can be related to injection granulomas.         --Persistent findings of high-grade small bowel obstruction, with increased small bowel dilation when compared to prior. No pneumatosis. Developing ischemia cannot be excluded on this unenhanced examination. Cannot entirely exclude closed-loop obstruction due to multiple apparent transition point.         ++++++++++++++++++++     The findings of this study were discussed via telephone with DRDr Myrtice Lauth of the medicine service by Dr. Benn Moulder on 10/12/2022 5:15 PM.     -----------------------------------------------    XR Abdomen 1 View    Result Date: 10/12/2022  EXAM: XR ABDOMEN 1 VIEW ACCESSION: [id number] UN     CLINICAL INDICATION: 72 years old with ABDOMINAL PAIN  -  UNSPECIFIED SITE      COMPARISON: KUB 10/11/2022 and prior.     TECHNIQUE: Portable supine views of the abdomen, 2 images(s)     FINDINGS: Unchanged position of nonweighted enteric tube. Redemonstration of pelvic surgical clips secondary to left renal transplant.     Unchanged appearance of multiple loops of small bowel some of which are air-filled others are opacified by oral contrast. There is similar degree of small bowel dilation with contrast retention.     Lung bases are clear.     No aggressive osseous lesions. Pelvic phleboliths.         position of esophagogastric  tube looped in the stomach with the tip directed towards the fundus. Persistent gaseous distention and retention of contrast in multiple loops of mildly dilated small bowel measuring up to 4 cm         ECG 12 Lead    Result Date: 10/12/2022  NORMAL SINUS RHYTHM ST & T WAVE ABNORMALITY, CONSIDER LATERAL ISCHEMIA ABNORMAL ECG WHEN COMPARED WITH ECG OF 05-Oct-2022 13:08, NONSPECIFIC T WAVE ABNORMALITY NOW EVIDENT IN INFERIOR LEADS

## 2022-10-15 NOTE — Unmapped (Signed)
Tacrolimus Therapeutic Monitoring Pharmacy Note    Julie [name] is a 72 y.o. female continuing tacrolimus.     Indication: Kidney transplant     Date of Transplant:  08/06/22       Prior Dosing Information: Current regimen tacrolimus 1.5 mg SL qAM and 1 mg qPM     Home regimen: Prograf 2 mg BID    Source(s) of information used to determine prior to admission dosing: Home Medication List or Clinic Note    Goals:  Therapeutic Drug Levels  Tacrolimus trough goal:  7-9 ng/mL - goal confirmed with transplant nephrology attending 3/19    Additional Clinical Monitoring/Outcomes  Monitor renal function (SCr and urine output) and liver function (LFTs)  Monitor for signs/symptoms of adverse events (e.g., hyperglycemia, hyperkalemia, hypomagnesemia, hypertension, headache, tremor)    Results:   Tacrolimus level:  5.9 ng/mL, drawn appropriately ~9 hour level    Pharmacokinetic Considerations and Significant Drug Interactions:  Concurrent hepatotoxic medications: None identified  Concurrent CYP3A4 substrates/inhibitors: None identified  Concurrent nephrotoxic medications: None identified    Assessment/Plan:  Recommendedation(s)  Increase to tacrolimus SL 1.5 mg BID per discussion with transplant nephrology    Follow-up  Daily levels currently ordered .   A pharmacist will continue to monitor and recommend levels as appropriate    Please page service pharmacist with questions/clarifications.    Rance Muir, PharmD

## 2022-10-15 NOTE — Unmapped (Signed)
Shift events: Medicated for pain with oxycodone 1x. 2 Bms watery loose. Antihypertensives held per parameter MAP < 70. Samara Deist MD notified via epic chat to revise parameters for administration of medications.     Pain: Lozenges for sore throat and oxycodone prn  Neuro:  Intact, follow commands, A&O x4 no periods of confusion/agitation   Resp: No distress RA  Cardiac: NSR adequate perfusion, afebrile. Palpable pulses.  GI: Clear liquid diet. LBM 10/15/2022. NGT R nare. Island dressing in place midabdominal incision.   GU: Voids bladder scanned x1 194 ml post scan void 200 ml  LDAs: Triple lumen CVC Left internal jugular   Skin: Healed lap sites and abdominal scars dressing in place  Gtts: none    *Breakfast ordered for pt at 06:30am     Problem: Comorbidity Management  Goal: Blood Pressure in Desired Range  Outcome: Progressing     Problem: Skin Injury Risk Increased  Goal: Skin Health and Integrity  Outcome: Progressing  Intervention: Optimize Skin Protection  Recent Flowsheet Documentation  Taken 10/15/2022 0000 by Noel Journey, RN  Head of Bed [facility]) Positioning: HOB at 30-45 degrees  Taken 10/14/2022 2200 by Dian Queen A, RN  Activity Management: up to bedside commode  Head of Bed [facility]) Positioning: HOB at 30 degrees  Taken 10/14/2022 2021 by Noel Journey, RN  Activity Management: up to bedside commode  Pressure Reduction Techniques:   weight shift assistance provided   heels elevated off bed  Head of Bed (HOB) Positioning: HOB at 30 degrees  Pressure Reduction Devices:   positioning supports utilized   foam padding utilized  Skin Protection:   adhesive use limited   cleansing with dimethicone incontinence wipes   incontinence pads utilized   silicone foam dressing in place   transparent dressing maintained   tubing/devices free from skin contact  Taken 10/14/2022 2000 by Dian Queen A, RN  Head of Bed [facility]) Positioning: HOB at 30-45 degrees

## 2022-10-15 NOTE — Unmapped (Signed)
[facility] Liaison Consult    [facility] Liaison Nurse was consulted to come and assess the patient's central line dressing; due to new bloody drainage around the insertion site. Went to the patient's bedside, and a small amount of blood is noted within the CHG dressing. It does not appear to be actively bleeding. Per primary nurse all the lumens flush and have blood return. Notified the primary nurse that if it continues to bleed, and bleeds outside of the CHG pad, to perform a dressing change and use statsteal and hold pressure for a few minutes.     Thank you for this consult,  Herbert Seta, RN, [facility] Liaison    Consult Time 15 minutes (min)

## 2022-10-15 NOTE — Unmapped (Signed)
General Surgery Progress Note      Assessment:   72 y.o. female with complicated previous medical history status post DCD DDKT in 07/2022, hypertension, type 2 diabetes, hypothyroidism, who was admitted for GNR bacteremia. General surgery consulted 3/17 as patient was found to have dilated loops of small bowel with multiple transition points in the RLE on CT, concerning for SBO. Continues to have high output from NGT in conjunction with failed GG challenge. Repeat CT AP 3/21 with persistent findings of high-grade SBO with worsening bowel distention. She is s/p exploratory laparotomy and reduction and small bowel volvulus on 10/13/22, transferred to [facility] on 10/14/22.     Plan:   *SBO   - s/p 3/22 ex lap w/ reduction of small bowel volvulus  - 3/24 s/p NGT, having bowel movements. Started on soft GI diet   - Monitor for nausea/vomiting  - MMPC: IV tylenol, Robaxin; PRN: IVHM 0.25mg , Oxy 5    Interval/Subjective:  NAEON, afebrile, hypertensive overnight to 185/30, otherwise VSS. 3x BM yesterday, patient's NGT with minimal output. NGT removed at bedside today. Pain remains controlled       Objective:     Vital signs in last 24 hours:  Temp:  [36.6 ??C (97.8 ??F)-37.1 ??C (98.7 ??F)] 36.7 ??C (98 ??F)  Heart Rate:  [63-78] 71  SpO2 Pulse:  [63-76] 71  Resp:  [15-25] 25  BP: (150-176)/(24-36) 176/31  MAP (mmHg):  [68-86] 86  SpO2:  [94 %-96 %] 96 %    Intake/Output last 24 hours:  I/O last 3 completed shifts:  In: 900 [NG/GT:320; IV Piggyback:580]  Out: 650 [Urine:600; Emesis/NG output:50]    Physical Exam:  General: no acute distress;     Neuro: A/O x 1  Pulm: Normal respiratory effort; equal chest rise   Cardiac: regular rate and rhythm   Abdomen: Soft, appropriately tender, distention improved. Incision C/D/I       Data Review:    I have reviewed the labs and studies from the last 24 hours.      Imaging: Radiology studies were personally reviewed    Earnest Rosier, MD  PGY-1, General Surgery

## 2022-10-16 DIAGNOSIS — Z940 Kidney transplant status: Principal | ICD-10-CM

## 2022-10-16 LAB — RENAL FUNCTION PANEL
ALBUMIN: 2.6 g/dL — ABNORMAL LOW (ref 3.4–5.0)
ANION GAP: 6 mmol/L (ref 5–14)
BLOOD UREA NITROGEN: 39 mg/dL — ABNORMAL HIGH (ref 9–23)
BUN / CREAT RATIO: 39
CALCIUM: 7.1 mg/dL — ABNORMAL LOW (ref 8.7–10.4)
CHLORIDE: 112 mmol/L — ABNORMAL HIGH (ref 98–107)
CO2: 23 mmol/L (ref 20.0–31.0)
CREATININE: 0.99 mg/dL
EGFR CKD-EPI (2021) FEMALE: 60 mL/min/{1.73_m2} (ref >=60)
GLUCOSE RANDOM: 138 mg/dL (ref 70–179)
PHOSPHORUS: 2.3 mg/dL — ABNORMAL LOW (ref 2.4–5.1)
POTASSIUM: 4.4 mmol/L (ref 3.4–4.8)
SODIUM: 141 mmol/L (ref 135–145)

## 2022-10-16 LAB — CBC
HEMATOCRIT: 24.1 % — ABNORMAL LOW (ref 34.0–44.0)
HEMOGLOBIN: 8.3 g/dL — ABNORMAL LOW (ref 11.3–14.9)
MEAN CORPUSCULAR HEMOGLOBIN CONC: 34.5 g/dL (ref 32.0–36.0)
MEAN CORPUSCULAR HEMOGLOBIN: 32.6 pg — ABNORMAL HIGH (ref 25.9–32.4)
MEAN CORPUSCULAR VOLUME: 94.3 fL (ref 77.6–95.7)
MEAN PLATELET VOLUME: 8 fL (ref 6.8–10.7)
PLATELET COUNT: 138 10*9/L — ABNORMAL LOW (ref 150–450)
RED BLOOD CELL COUNT: 2.55 10*12/L — ABNORMAL LOW (ref 3.95–5.13)
RED CELL DISTRIBUTION WIDTH: 15.8 % — ABNORMAL HIGH (ref 12.2–15.2)
WBC ADJUSTED: 3.1 10*9/L — ABNORMAL LOW (ref 3.6–11.2)

## 2022-10-16 LAB — LACTATE, VENOUS, WHOLE BLOOD: LACTATE BLOOD VENOUS: 0.7 mmol/L (ref 0.5–1.8)

## 2022-10-16 LAB — MAGNESIUM: MAGNESIUM: 1.8 mg/dL (ref 1.6–2.6)

## 2022-10-16 MED ADMIN — HYDROmorphone (PF) injection Syrg 0.25 mg: .25 mg | INTRAVENOUS | @ 07:00:00 | Stop: 2022-10-21

## 2022-10-16 MED ADMIN — melatonin tablet 6 mg: 6 mg | GASTROENTERAL | @ 21:00:00

## 2022-10-16 MED ADMIN — methocarbamol (ROBAXIN) tablet 500 mg: 500 mg | ENTERAL | @ 21:00:00

## 2022-10-16 MED ADMIN — mycophenolate (CELLCEPT) 500 mg in dextrose 5 % 100 mL IVPB: 500 mg | INTRAVENOUS | @ 15:00:00

## 2022-10-16 MED ADMIN — atorvastatin (LIPITOR) tablet 10 mg: 10 mg | GASTROENTERAL | @ 13:00:00

## 2022-10-16 MED ADMIN — meropenem (MERREM) 1 g in sodium chloride 0.9 % (NS) 100 mL IVPB-MBP: 1 g | INTRAVENOUS | @ 13:00:00 | Stop: 2022-10-16

## 2022-10-16 MED ADMIN — tacrolimus (PROGRAF) 1.5mg combo product: 1.5 mg | SUBLINGUAL | @ 13:00:00

## 2022-10-16 MED ADMIN — methocarbamol (ROBAXIN) tablet 500 mg: 500 mg | ENTERAL | @ 16:00:00

## 2022-10-16 MED ADMIN — amlodipine (NORVASC) tablet 5 mg: 5 mg | GASTROENTERAL | @ 13:00:00

## 2022-10-16 MED ADMIN — labetalol (NORMODYNE) injection: 20 mg | INTRAVENOUS | @ 17:00:00

## 2022-10-16 MED ADMIN — mycophenolate (CELLCEPT) 500 mg in dextrose 5 % 100 mL IVPB: 500 mg | INTRAVENOUS | @ 03:00:00

## 2022-10-16 MED ADMIN — insulin lispro (HumaLOG) injection 0-20 Units: 0-20 [IU] | SUBCUTANEOUS | @ 19:00:00

## 2022-10-16 MED ADMIN — insulin lispro (HumaLOG) injection 0-20 Units: 0-20 [IU] | SUBCUTANEOUS | @ 22:00:00

## 2022-10-16 MED ADMIN — tacrolimus (PROGRAF) 1.5mg combo product: 1.5 mg | SUBLINGUAL | @ 02:00:00

## 2022-10-16 MED ADMIN — heparin (porcine) 5,000 unit/mL injection 5,000 Units: 5000 [IU] | SUBCUTANEOUS | @ 10:00:00

## 2022-10-16 MED ADMIN — micafungin (MYCAMINE) 150 mg in sodium chloride (NS) 0.9 % 100 mL IVPB: 150 mg | INTRAVENOUS | @ 15:00:00 | Stop: 2022-10-23

## 2022-10-16 MED ADMIN — acetaminophen (OFIRMEV) 10 mg/mL injection 650 mg 65 mL: 650 mg | INTRAVENOUS | @ 03:00:00 | Stop: 2022-10-15

## 2022-10-16 MED ADMIN — heparin (porcine) 5,000 unit/mL injection 5,000 Units: 5000 [IU] | SUBCUTANEOUS | @ 02:00:00

## 2022-10-16 MED ADMIN — methocarbamol (ROBAXIN) tablet 500 mg: 500 mg | ENTERAL | @ 02:00:00

## 2022-10-16 MED ADMIN — pantoprazole (Protonix) injection 40 mg: 40 mg | INTRAVENOUS | @ 13:00:00

## 2022-10-16 MED ADMIN — sulfamethoxazole-trimethoprim (BACTRIM) 400-80 mg tablet 80 mg of trimethoprim: 1 | ENTERAL | @ 15:00:00 | Stop: 2023-01-15

## 2022-10-16 MED ADMIN — carvedilol (COREG) tablet 25 mg: 25 mg | GASTROENTERAL | @ 13:00:00

## 2022-10-16 MED ADMIN — methocarbamol (ROBAXIN) tablet 500 mg: 500 mg | ENTERAL | @ 10:00:00

## 2022-10-16 MED ADMIN — hydrALAZINE (APRESOLINE) tablet 50 mg: 50 mg | ENTERAL | @ 13:00:00 | Stop: 2022-10-16

## 2022-10-16 MED ADMIN — oxyCODONE (ROXICODONE) immediate release tablet 5 mg: 5 mg | ENTERAL | @ 13:00:00 | Stop: 2022-10-27

## 2022-10-16 MED ADMIN — methylPREDNISolone sodium succinate (PF) (SOLU-Medrol) injection 4 mg: 4 mg | INTRAVENOUS | @ 10:00:00

## 2022-10-16 MED ADMIN — heparin (porcine) 5,000 unit/mL injection 5,000 Units: 5000 [IU] | SUBCUTANEOUS | @ 19:00:00

## 2022-10-16 MED ADMIN — meropenem (MERREM) 1 g in sodium chloride 0.9 % (NS) 100 mL IVPB-MBP: 1 g | INTRAVENOUS | @ 02:00:00 | Stop: 2022-10-19

## 2022-10-16 MED ADMIN — simethicone (MYLICON) chewable tablet 80 mg: 80 mg | ORAL | @ 16:00:00

## 2022-10-16 MED ADMIN — pantoprazole (Protonix) injection 40 mg: 40 mg | INTRAVENOUS | @ 02:00:00

## 2022-10-16 MED ADMIN — levothyroxine (SYNTHROID) tablet 112 mcg: 112 ug | ENTERAL | @ 10:00:00

## 2022-10-16 NOTE — Unmapped (Signed)
IMMUNOCOMPROMISED HOST INFECTIOUS DISEASE PROGRESS NOTE    Assessment/Plan:     Julie [name] is a 72 y.o. 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 Had perinephric drain in place postoperatively, removed 08/30/22  - Immunosuppression: Tacrolimus (goal 7-9), MM 500F, Pred 5  - Prophylaxis: valganciclovir x 6 months (mod risk), TMP+SMX x 6 months     Pertinent comorbidities:  ESRD was on PD and HD  DM II (A1c 5.6 on 08/06/22)  CAD  HLD  SBO 10/07/22 s/p ex lap 10/13/22 w/ lysis of adhesions     Pertinent exposures:  Originally from [place]   Treated with ivermectin at the ID clinic in 2018.     Summary of pertinent prior infections:  #History of Shingles 2017  #04/2018 Dialysis fluid Serratia R: Ampicillin, Unasyn, Cefazolin,S: Ceftriaxone, Gentamicin, Levo, PipTazo, Tobra  #Donor urine cx with C. Dubliensis 08/05/22, <10K, negative recipient samples, negative donor blood cx, treated with 2 weeks PO fluconazole  #E. Coli bacteremia and UTI 08/21/22  - Ucx/Bcx 1/29 E. Coli, R amp, amp/sulb, TMP/SMX. Susc CFZ, cipro/levo, gent/tobra, nitrofurantoin, tetra  - Treated with vanc/CFP -> 3 weeks levofloxacin through 2/26 (ureteral stent removed 2/15)     Active infection:  # ESBL/FQ-R E. coli bacteremia and UTI 10/04/22, improving  # Perinephric fluid collection 10/05/22, re-accumulated after JP drain removal 08/30/22  3/12 developed fever and malaise at home, per family poor PO intake and diarrhea  - 3/14 Bcx 2/2 E. Coli; (S: Erta, Gent, Mero, Tobra, Cefepime SDD; I: ceftaz; R: Cipro/Levo, Cefazolin, Ceftriaxone, Amp)              - 3/16 BCx (NG)  - 3/14 UA 26 WBC, Ucx 10-50K E. Coli (no AST) - fosfomycin susceptible  - 3/14 Imaging:   Renal US: 8.1 x 0.8 x 2.3 cm perinephric collection new from prior, considering small hematoma, complicated seroma, or less likely abscess  CT A/P: perinephric stranding, 5.3 x 2.0 cm ill-defined perinephric collection limited evaluation without contrast. No stones seen. 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 Coli UTI/bacteremia  Continue meropenem, renally dosed equivalent of 1g q8h  Duration: 2 weeks 3/19-4/1   Plan for Fosfomycin ppx  3g once weekly after treatment completed if affordable and tolerable (can cause diarrhea)     For thrush and presumed candida esophagitis  Can stop  IV micafungin 150 mg IV daily (dosed for esophagitis)  Start po fluconazole renally dosed equivalent of 400 mg q24h, would plan on 14 days of treatment total       Antimicrobial prophylaxis required for host deficiency: transplant immunosuppression  Valganciclovir renally dosed equiv of 900 mg daily x 6 months (moderate risk)  TMP+SMX renally dosed equiv of 400/80 po MWF x 6 months  Needs LFT/hepB monitoring as below      Intensive toxicity monitoring for prescription antimicrobials   CBC w/diff at least once per week  CMP at least once per week  clinical assessments for rashes or other skin changes  Given hepB core+, needs LFTs q86m for first year, HBSAg and HBV DNA q33m for first year and with any rise in LFTs    Solid Organ Transplant Infectious Diseases Follow-up Instructions  Appointment: PENDING  Location: [facility], [address] [place], [place]  Labs: weekly CBC with differential, CMP  Please fax labs to patient???s transplant coordinator: Celene Squibb at  [phone] (Kidney/Pancreas) and to [employer] (ICHID pharmD at [phone])   Antibiotics:   Meropenem IV 1g q12h (renal dose equivalent of 1g q8h)  Antibiotic End Date: 10/23/22      The [facility] ID APP service will continue to follow.           Please page Darolyn Rua, NP at [phone] from 8-4:30pm, after 4:30 pm & on weekends, please page the ID Transplant/Liquid Oncology Fellow consult at [phone] with questions.    Patient discussed with Dr. Michiel Cowboy.      Darolyn Rua, MSN, APRN, AGNP-C  Immunocompromised Infectious Disease Nurse Practitioner    I personally spent 30 minutes face-to-face and non-face-to-face in the care of this patient, which includes all pre, intra, and post visit time on the date of service.  All documented time was specific to the E/M visit and does not include any procedures that may have been performed.      Subjective:     External record(s): Consultant note(s): Transplant Nephrology note reviewed and noted plan to change back to oral tacrolimus (from sublingual) in the next few days .    Independent historian(s): no independent historian required.       Interval History:   Afebrile and NAEON. Patient states she is ready to go home. Per family her oral intake is slowly improving. Denies trouble breathing, n/v, dysuria. Endorses occasional diarrhea.     Medications:  Current Medications as of 10/16/2022  Scheduled  PRN   amlodipine, 5 mg, Q12H SCH  atorvastatin, 10 mg, Q24H SCH  carvedilol, 25 mg, Q12H SCH  heparin (porcine) for subcutaneous use, 5,000 Units, Q8H SCH  hydrALAZINE, 50 mg, BID  [Provider Hold] insulin glargine, 6 Units, Nightly  insulin lispro, 0-20 Units, ACHS  levothyroxine, 112 mcg, daily  [Provider Hold] magnesium oxide-Mg AA chelate, 2 tablet, BID  melatonin, 6 mg, QPM  meropenem, 1 g, Q12H  methocarbamol, 500 mg, QID  methylPREDNISolone sodium succinate, 4 mg, Q24H  micafungin, 150 mg, Q24H  mycophenolate (CELLCEPT) 500 mg in dextrose 5 % 100 mL IVPB, 500 mg, Q12H  [Provider Hold] pantoprazole, 40 mg, Daily  pantoprazole (Protonix) intravenous solution, 40 mg, BID  sulfamethoxazole-trimethoprim, 1 tablet, Once per day on Monday Wednesday Friday  tacrolimus, 1.5 mg, Daily  tacrolimus, 1.5 mg, Nightly  valGANciclovir, 450 mg, Every other day      calcium carbonate, 200 mg of elem calcium, TID PRN  dextrose in water, 12.5 g, Q10  Min PRN  glucagon, 1 mg, Once PRN  glucose, 16 g, Q10 Min PRN  guaiFENesin, 200 mg, Q4H PRN  HYDROmorphone, 0.25 mg, Q4H PRN  labetalol, 20 mg, Q4H PRN  cough/sore throat lozenge, 1 lozenge, Q2H PRN  OLANZapine, 5 mg, Nightly PRN   Or  OLANZapine zydis, 5 mg, Nightly PRN  ondansetron, 4 mg, Q8H PRN   Or  ondansetron, 4 mg, Q8H PRN  oxyCODONE, 5 mg, Q4H PRN  phenol, 2 spray, Q2H PRN  prochlorperazine, 10 mg, Q8H PRN  simethicone, 80 mg, Q6H PRN         Objective:     Vital Signs last 24 hours:  Temp:  [35.7 ??C (96.3 ??F)-36.9 ??C (98.5 ??F)] 36.3 ??C (97.4 ??F)  Heart Rate:  [68-75] 68  SpO2 Pulse:  [68-74] 68  Resp:  [14-23] 23  BP: (120-204)/(22-46) (P) 158/54  MAP (mmHg):  [62-97] 97  SpO2:  [94 %-100 %] 100 %    Physical Exam:   Patient Lines/Drains/Airways Status       Active Active Lines, Drains, & Airways       Name Placement date Placement time Site Days    CVC Triple Lumen 10/13/22 Non-tunneled Internal jugular 10/13/22  1345  Internal jugular  3    Arteriovenous Fistula - Vein Graft  Access 08/24/16 Left;Upper Arm 08/24/16  --  Arm  2244                  Const [x]  vital signs above    []  NAD, non-toxic appearance []  Chronically ill-appearing, non-distressed  Sitting in chair; family at bedside      Eyes [x]  Lids normal bilaterally, conjunctiva anicteric and noninjected OU     [] PERRL  [] EOMI        ENMT [x]  Normal appearance of external nose and ears, no nasal discharge        [x]  MMM, no lesions on lips or gums []  No thrush, leukoplakia, oral lesions  []  Dentition good []  Edentulous []  Dental caries present  []  Hearing normal  []  TMs with good light reflexes bilaterally         Neck [x]  Neck of normal appearance and trachea midline        []  No thyromegaly, nodules, or tenderness   []  Full neck ROM        Lymph []  No LAD in neck     []  No LAD in supraclavicular area     []  No LAD in axillae   []  No LAD in epitrochlear chains     []  No LAD in inguinal areas        CV []  RRR            []  No peripheral edema     []  Pedal pulses intact   []  No abnormal heart sounds appreciated   [x]  Extremities WWP   Murmur present      Resp [x]  Normal WOB at rest    []  No breathlessness with speaking, no coughing  []  CTA anteriorly    []  CTA posteriorly          GI [x]  Normal inspection, NTND   []  NABS     []  No umbilical hernia on exam       []  No hepatosplenomegaly     []  Inspection of perineal and perianal areas normal        GU []  Normal external genitalia     [] No urinary catheter present in urethra   []   No CVA tenderness    []  No tenderness over renal allograft        MSK []  No clubbing or cyanosis of hands       []  No vertebral point tenderness  []  No focal tenderness or abnormalities on palpation of joints in RUE, LUE, RLE, or LLE        Skin [x]  No rashes, lesions, or ulcers of visualized skin     []  Skin warm and dry to palpation   RIJ with dressing c/d/i      Neuro []  Face expression symmetric  []  Sensation to light touch grossly intact throughout    []  Moves extremities equally    []  No tremor noted        []  CNs II-XII grossly intact     []  DTRs normal and symmetric throughout []  Gait unremarkable        Psych []  Appropriate affect       []  Fluent speech         []  Attentive, good eye contact  []  Oriented to person, place, time          []  Judgment and insight are appropriate           Data for Medical Decision Making     10/12/22 EKG QTcF    I discussed not done.    I reviewed CBC results (WBC continuing to decrease; platelets similar to prior), chemistry results (creatinine improved), and Mag WNL; Phos low  .    I independently visualized/interpreted not done.       Recent Labs   Lab Units 10/16/22  0440 10/13/22  0428 10/12/22  0716   WBC 10*9/L 3.1*   < > 3.0*   HEMOGLOBIN g/dL 8.3*   < > 9.7*   PLATELET COUNT (1) 10*9/L 138*   < > 106*   BUN mg/dL 39*   < > 33* - 33*   CREATININE mg/dL 1.61   < > 0.96* - 0.45*   AST U/L  --   --  19   ALT U/L  --   --  <7*   BILIRUBIN TOTAL mg/dL  --   --  0.4   ALK PHOS U/L  --   --  45*   POTASSIUM mmol/L 4.4   < > 4.5 - 4.5   MAGNESIUM mg/dL 1.8   < > 1.9   PHOSPHORUS mg/dL 2.3*   < > 2.2*   CALCIUM mg/dL 7.1*   < > 8.4* - 8.4*    < > = values in this interval not displayed.     Lab Results   Component Value Date    TACROLIMUS 5.9 10/15/2022         Microbiology:  Microbiology Results (last day)       Procedure Component Value Date/Time Date/Time    Yeast Screen [[id number]]  (Abnormal) Collected: 10/10/22 1510    Lab Status: Final result Specimen: Throat Updated: 10/16/22 1153     Yeast Screen 1+ Candida albicans      3+ Oropharyngeal Flora Isolated    Narrative:      Specimen Source: Throat           Imaging:  No new imaging

## 2022-10-16 NOTE — Unmapped (Cosign Needed)
General Surgery Progress Note      Assessment:   72 y.o. female with complicated previous medical history status post DCD DDKT in 07/2022, hypertension, type 2 diabetes, hypothyroidism, who was admitted for GNR bacteremia. General surgery consulted 3/17 as patient was found to have dilated loops of small bowel with multiple transition points in the RLE on CT, concerning for SBO. Continues to have high output from NGT in conjunction with failed GG challenge. Repeat CT AP 3/21 with persistent findings of high-grade SBO with worsening bowel distention. She is s/p exploratory laparotomy and reduction and small bowel volvulus on 10/13/22, transferred to [facility] on 10/14/22.     Plan:   *SBO   - s/p 3/22 ex lap w/ reduction of small bowel volvulus  - 3/24 s/p NGT pulled, having bowel movements. Continue soft GI diet   - Monitor for nausea/vomiting  - MMPC: IV tylenol, Robaxin; PRN: IVHM 0.25mg , Oxy 5  - fu w/ ID in regards to abx plan upon discharge   -currently on IV micafungin and IV meropenem    Interval/Subjective:  NAEON, afebrile, hypertensive overnight but with low diastolic's, held on BP meds last night, otherwise VSS. 5x BM yesterday. Pain controlled. Patient and family wondering when the patient will be able to leave.     Objective:     Vital signs in last 24 hours:  Temp:  [35.7 ??C (96.3 ??F)-36.9 ??C (98.5 ??F)] 36.3 ??C (97.4 ??F)  Heart Rate:  [68-75] 68  SpO2 Pulse:  [68-74] 68  Resp:  [14-23] 23  BP: (120-204)/(22-46) (P) 158/54  MAP (mmHg):  [62-97] 97  SpO2:  [94 %-100 %] 100 %    Intake/Output last 24 hours:  I/O last 3 completed shifts:  In: 1795 [P.O.:480; NG/GT:220; IV Piggyback:1095]  Out: 1075 [Urine:975; Stool:100]    Physical Exam:  General: no acute distress;     Neuro: A/O x 1  Pulm: Normal respiratory effort; equal chest rise   Cardiac: regular rate and rhythm   Abdomen: Soft, appropriately tender, distention improved. Incision C/D/I       Data Review:    I have reviewed the labs and studies from the last 24 hours.      Imaging: Radiology studies were personally reviewed

## 2022-10-16 NOTE — Unmapped (Cosign Needed)
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 hospital course complicated with small bowel obstruction.    Kidney transplant 08/06/22   Mild non oliguric AKI resolved to baseline creatinine 1.2-1.4 mg/dl.     Immunosuppression [High risk medical decision making for drug therapy requiring intensive monitoring for toxicity]  - Tacrolimus was decreased from 1.5/1 mg SL to 1.5 mg SL daily on 3/24 for elevated troughs of 11 and 10.4 on 3/22 and 3/23. 3/24 level was a touch low at 5.9. Now increased back to 1.5 mg BID. Goal trough is 7-9.   - with above changes would continue the SL tacrolimus for now. Will likely transition to oral tacrolimus in next 24h to 48h.   - IV mycophenolate 500mg  bid  - IV methylprednisolone 4mg  daily    E.coli bacteremia from complicated urinary tract infection +/-transplant pyelonephritis  Repeat blood cultures 3/16 with no growth to date. Perinephric fluid aspiration 3/19 no growth to date  - Appreciate ICID input   - Meropenem 3/15 - TBD (min 2 week course)   - Plan outpatient referral to urogynecology for evaluation and mgmt recurrent urine tract infection    Small bowel obstruction  - S/p ex-lap and reduction on 10/13/22  - soft GI diet started on 3/24     Hypertension  I solated systolic hypertension with wide pulse pressure and low diastolic readings make mgmt challenging without causing lightheadedness and increasing risk for falls, organ hypoperfusion. All BP meds were held last night due to diastolic hypotension.   - Clonidine taper to 0.1mg  q.pm x3d then discontinuation encouraged as tolerated d/t fatigue and sedation associated with this medication.  - Amlodipine 5 mg BID, coreg 25 mg BID, hydralazine 100 mg BID Infectious prophylaxis and monitoring:   CMV D+/R+, EBV D+/R+  valganciclovir 450mg  daily x9m 1-4/24   trimethoprim/sulfamethoxazole 80/400mg  3x/week x3m 1-7/24  micafungin 150mg  daily vs oral +/-esophageal candidiasis    Florian Buff, MD  Division of Nephrology and Hypertension  [facility]  10/16/2022  _______________________________________________________________________________    Kidney Transplant History:   Date of Transplant: 08/06/2022 (Kidney)  KDPI: 56%  Cold ischemic time: 877 minutes (14 hr 37 min). Warm ischemic time: 32 minutes  cPRA: 0%  HLA match: Blood type: Donor A1, Recipient A POS  ID: CMV D+/R+, EBV D+/R+  Native Kidney Disease: Diabetes              Native kidney biopsy: Diabetic nephropathy (done elsewhere)              Pre-transplant dialysis course: PD started July 2017, transitioned to HD October 2019 due to catheter dysfunction/inadequate dialysis  Induction: Basiliximab with steroid maintenance    subjective: Pt doing well this morning. She made 775 mL urine. BP's 170's/40's and up to 204/46 this morning.     medications:   Current Facility-Administered Medications   Medication Dose Route Frequency    amlodipine (NORVASC) tablet 5 mg  5 mg Oral Q12H SCH    atorvastatin (LIPITOR) tablet 10 mg  10 mg Oral Q24H SCH    calcium carbonate (TUMS) chewable tablet 200 mg of elem calcium  200 mg of elem calcium Oral TID PRN    carvedilol (COREG) tablet 25 mg  25  mg Oral Q12H SCH    cloNIDine HCL (CATAPRES) tablet 0.1 mg  0.1 mg Oral Nightly    dextrose (D10W) 10% bolus 125 mL  12.5 g Intravenous Q10 Min PRN    glucagon injection 1 mg  1 mg Intramuscular Once PRN    glucose chewable tablet 16 g  16 g Oral Q10 Min PRN    guaiFENesin (ROBITUSSIN) oral syrup  200 mg Oral Q4H PRN    heparin (porcine) 5,000 unit/mL injection 5,000 Units  5,000 Units Subcutaneous Q8H SCH    hydrALAZINE (APRESOLINE) tablet 25 mg  25 mg Oral BID    HYDROmorphone (PF) injection Syrg 0.25 mg  0.25 mg Intravenous Q4H PRN    insulin glargine (LANTUS) injection 6 Units  6 Units Subcutaneous Nightly    insulin lispro (HumaLOG) injection 0-20 Units  0-20 Units Subcutaneous ACHS    labetalol (NORMODYNE) injection  20 mg Intravenous Q4H PRN    levothyroxine (SYNTHROID) tablet 112 mcg  112 mcg Oral daily    magnesium oxide-Mg AA chelate (Magnesium Plus Protein) 2 tablet  2 tablet Oral BID    melatonin tablet 3 mg  3 mg Oral Nightly PRN    meropenem (MERREM) 1 g in sodium chloride 0.9 % (NS) 100 mL IVPB-MBP  1 g Intravenous Q12H    methylPREDNISolone sodium succinate (PF) (SOLU-Medrol) injection 5.2 mg  5.2 mg Intravenous Q24H    micafungin (MYCAMINE) 150 mg in sodium chloride (NS) 0.9 % 100 mL IVPB  150 mg Intravenous Q24H    mycophenolate (CELLCEPT) 500 mg in dextrose 5 % 100 mL IVPB  500 mg Intravenous Q12H    ondansetron (ZOFRAN-ODT) disintegrating tablet 4 mg  4 mg Oral Q8H PRN    Or    ondansetron (ZOFRAN) injection 4 mg  4 mg Intravenous Q8H PRN    pantoprazole (Protonix) EC tablet 40 mg  40 mg Oral Daily    phenol (CHLORASEPTIC) 1.4 % spray 2 spray  2 spray Mucous Membrane Q2H PRN    prochlorperazine (COMPAZINE) injection 10 mg  10 mg Intramuscular Q8H PRN    simethicone (MYLICON) chewable tablet 80 mg  80 mg Oral Q6H PRN    sodium chloride (NS) 0.9 % infusion  75 mL/hr Intravenous Continuous    sulfamethoxazole-trimethoprim (BACTRIM) 400-80 mg tablet 80 mg of trimethoprim  1 tablet Oral Once per day on Monday Wednesday Friday    tacrolimus (PROGRAF) 1.5mg  combo product  1.5 mg Sublingual Daily    tacrolimus (PROGRAF) capsule 1 mg  1 mg Sublingual Nightly    valGANciclovir (VALCYTE) tablet 450 mg  450 mg Oral Every other day      ALLERGIES  Oxycodone, Gabapentin, and Valacyclovir    physical exam:  Vitals:    10/13/22 0744   BP: 158/52   Pulse: 75   Resp: 17   Temp: 97.7   SpO2: 97%     Intake/Output Summary (Last 24 hours) at 10/13/2022 1035  Last data filed at 10/13/2022 0400  Gross per 24 hour   Intake 0 ml   Output 1450 ml Net -1450 ml     General: seated in chair no acute distress.  HEENT: anicteric sclera, MMM  CV RRR, + systolic murmur   Lungs: no increased work of breathing  Abdomen: soft non tender mild distended  Skin: no visible lesions or rashes  Neuro: alert oriented x3. non focal exam.    Test Results  Lab Results   Component Value Date    WBC 3.1 (  L) 10/16/2022    HGB 8.3 (L) 10/16/2022    HCT 24.1 (L) 10/16/2022    PLT 138 (L) 10/16/2022     Lab Results   Component Value Date    NA 141 10/16/2022    K 4.4 10/16/2022    CL 112 (H) 10/16/2022    CO2 23.0 10/16/2022    BUN 39 (H) 10/16/2022    CREATININE 0.99 10/16/2022    GLU 138 10/16/2022    CALCIUM 7.1 (L) 10/16/2022    MG 1.8 10/16/2022    PHOS 2.3 (L) 10/16/2022      BILITOT 0.4 10/12/2022    PROT 5.5 (L) 10/12/2022    ALBUMIN 3.3 (L) 10/13/2022    ALT <7 (L) 10/12/2022    AST 19 10/12/2022    ALKPHOS 45 (L) 10/12/2022

## 2022-10-16 NOTE — Unmapped (Signed)
Intermediate status maintained. Alert and oriented. NSR. SBP elevated, DBP low. MAPs low as a result, held PM meds according to order parameters, provider aware. Room air. Surgical GI diet, ate 50% of dinner. Ambulating to bsc, appropriate urine output. No BM this shift. Given dilaudid x1 PRN for mid/lower abdominal pain. SubQ heparin given. Afebrile. Will continue to monitor this shift, see flowsheets for further details.     Problem: Adult Inpatient Plan of Care  Goal: Plan of Care Review  Outcome: Ongoing - Unchanged  Goal: Patient-Specific Goal (Individualized)  Outcome: Ongoing - Unchanged  Goal: Absence of Hospital-Acquired Illness or Injury  Outcome: Ongoing - Unchanged  Intervention: Identify and Manage Fall Risk  Recent Flowsheet Documentation  Taken 10/15/2022 2000 by Damaris Hippo, RN  Safety Interventions:   bed alarm   environmental modification   fall reduction program maintained   lighting adjusted for tasks/safety   low bed  Intervention: Prevent Skin Injury  Recent Flowsheet Documentation  Taken 10/16/2022 0200 by Damaris Hippo, RN  Positioning for Skin:   Supine/Back   Right  Taken 10/16/2022 0000 by Damaris Hippo, RN  Positioning for Skin:   Sitting in Chair   Left  Taken 10/15/2022 2200 by Damaris Hippo, RN  Positioning for Skin:   Sitting in Chair   Right  Taken 10/15/2022 2000 by Damaris Hippo, RN  Positioning for Skin:   Sitting in Chair   Left  Device Skin Pressure Protection: absorbent pad utilized/changed  Skin Protection: adhesive use limited  Intervention: Prevent and Manage VTE (Venous Thromboembolism) Risk  Recent Flowsheet Documentation  Taken 10/16/2022 0200 by Damaris Hippo, RN  Anti-Embolism Device Type: SCD, Knee  Anti-Embolism Intervention: On  Anti-Embolism Device Location: BLE  Taken 10/16/2022 0000 by Damaris Hippo, RN  Anti-Embolism Device Type: SCD, Knee  Anti-Embolism Intervention: On  Anti-Embolism Device Location: BLE  Taken 10/15/2022 2200 by Damaris Hippo, RN  Anti-Embolism Device Type: SCD, Knee  Anti-Embolism Intervention: On  Anti-Embolism Device Location: BLE  Taken 10/15/2022 2000 by Damaris Hippo, RN  Anti-Embolism Device Type: SCD, Knee  Anti-Embolism Intervention: On  Anti-Embolism Device Location: BLE  Intervention: Prevent Infection  Recent Flowsheet Documentation  Taken 10/15/2022 2000 by Damaris Hippo, RN  Infection Prevention:   environmental surveillance performed   cohorting utilized   equipment surfaces disinfected   hand hygiene promoted   personal protective equipment utilized  Goal: Optimal Comfort and Wellbeing  Outcome: Ongoing - Unchanged  Goal: Readiness for Transition of Care  Outcome: Ongoing - Unchanged  Goal: Rounds/Family Conference  Outcome: Ongoing - Unchanged     Problem: Infection  Goal: Absence of Infection Signs and Symptoms  Outcome: Ongoing - Unchanged  Intervention: Prevent or Manage Infection  Recent Flowsheet Documentation  Taken 10/15/2022 2000 by Damaris Hippo, RN  Infection Management: aseptic technique maintained     Problem: Fall Injury Risk  Goal: Absence of Fall and Fall-Related Injury  Outcome: Ongoing - Unchanged  Intervention: Promote Injury-Free Environment  Recent Flowsheet Documentation  Taken 10/15/2022 2000 by Damaris Hippo, RN  Safety Interventions:   bed alarm   environmental modification   fall reduction program maintained   lighting adjusted for tasks/safety   low bed     Problem: Self-Care Deficit  Goal: Improved Ability to Complete Activities of Daily Living  Outcome: Ongoing - Unchanged     Problem: Comorbidity Management  Goal: Maintenance of Asthma Control  Outcome: Ongoing - Unchanged  Goal: Maintenance of Behavioral Health Symptom Control  Outcome: Ongoing - Unchanged  Goal: Maintenance of COPD Symptom Control  Outcome: Ongoing - Unchanged  Goal: Blood Glucose Levels Within Targeted Range  Outcome: Ongoing - Unchanged  Goal: Maintenance of Heart Failure Symptom Control  Outcome: Ongoing - Unchanged  Goal: Blood Pressure in Desired Range  Outcome: Ongoing - Unchanged  Goal: Maintenance of Osteoarthritis Symptom Control  Outcome: Ongoing - Unchanged  Intervention: Maintain Osteoarthritis Symptom Control  Recent Flowsheet Documentation  Taken 10/16/2022 0200 by Damaris Hippo, RN  Activity Management: up in chair  Taken 10/16/2022 0000 by Damaris Hippo, RN  Activity Management: up in chair  Taken 10/15/2022 2200 by Damaris Hippo, RN  Activity Management: up in chair  Taken 10/15/2022 2000 by Damaris Hippo, RN  Activity Management: up in chair  Goal: Bariatric Home Regimen Maintained  Outcome: Ongoing - Unchanged  Goal: Maintenance of Seizure Control  Outcome: Ongoing - Unchanged     Problem: Skin Injury Risk Increased  Goal: Skin Health and Integrity  Outcome: Ongoing - Unchanged  Intervention: Optimize Skin Protection  Recent Flowsheet Documentation  Taken 10/16/2022 0200 by Damaris Hippo, RN  Activity Management: up in chair  Head of Bed [facility]) Positioning: HOB at 30-45 degrees  Taken 10/16/2022 0000 by Damaris Hippo, RN  Activity Management: up in chair  Head of Bed [facility]) Positioning: HOB at 30-45 degrees  Taken 10/15/2022 2309 by Damaris Hippo, RN  Head of Bed [facility]) Positioning: HOB at 30-45 degrees  Taken 10/15/2022 2200 by Damaris Hippo, RN  Activity Management: up in chair  Head of Bed [facility]) Positioning: HOB at 30-45 degrees  Taken 10/15/2022 2000 by Damaris Hippo, RN  Activity Management: up in chair  Pressure Reduction Techniques: frequent weight shift encouraged  Head of Bed (HOB) Positioning: HOB at 30-45 degrees  Pressure Reduction Devices:   positioning supports utilized   pressure-redistributing mattress utilized  Skin Protection: adhesive use limited  Taken 10/15/2022 1953 by Damaris Hippo, RN  Head of Bed (HOB) Positioning: HOB at 30-45 degrees     Problem: Wound  Goal: Optimal Coping  Outcome: Ongoing - Unchanged  Goal: Optimal Functional Ability  Outcome: Ongoing - Unchanged  Intervention: Optimize Functional Ability  Recent Flowsheet Documentation  Taken 10/16/2022 0200 by Damaris Hippo, RN  Activity Management: up in chair  Taken 10/16/2022 0000 by Damaris Hippo, RN  Activity Management: up in chair  Taken 10/15/2022 2200 by Damaris Hippo, RN  Activity Management: up in chair  Taken 10/15/2022 2000 by Damaris Hippo, RN  Activity Management: up in chair  Goal: Absence of Infection Signs and Symptoms  Outcome: Ongoing - Unchanged  Intervention: Prevent or Manage Infection  Recent Flowsheet Documentation  Taken 10/15/2022 2000 by Damaris Hippo, RN  Infection Management: aseptic technique maintained  Goal: Improved Oral Intake  Outcome: Ongoing - Unchanged  Goal: Optimal Pain Control and Function  Outcome: Ongoing - Unchanged  Goal: Skin Health and Integrity  Outcome: Ongoing - Unchanged  Intervention: Optimize Skin Protection  Recent Flowsheet Documentation  Taken 10/16/2022 0200 by Damaris Hippo, RN  Activity Management: up in chair  Head of Bed [facility]) Positioning: HOB at 30-45 degrees  Taken 10/16/2022 0000 by Damaris Hippo, RN  Activity Management: up in chair  Head of Bed [facility]) Positioning: HOB at 30-45 degrees  Taken 10/15/2022 2309 by Damaris Hippo, RN  Head of Bed [facility]) Positioning: HOB at 30-45 degrees  Taken 10/15/2022 2200 by Damaris Hippo, RN  Activity Management: up in chair  Head of Bed [facility]) Positioning: HOB at 30-45 degrees  Taken 10/15/2022  2000 by Damaris Hippo, RN  Activity Management: up in chair  Pressure Reduction Techniques: frequent weight shift encouraged  Head of Bed (HOB) Positioning: HOB at 30-45 degrees  Pressure Reduction Devices:   positioning supports utilized   pressure-redistributing mattress utilized  Skin Protection: adhesive use limited  Taken 10/15/2022 1953 by Damaris Hippo, RN  Head of Bed [facility]) Positioning: HOB at 30-45 degrees  Goal: Optimal Wound Healing  Outcome: Ongoing - Unchanged

## 2022-10-17 DIAGNOSIS — Z940 Kidney transplant status: Principal | ICD-10-CM

## 2022-10-17 LAB — CBC
HEMATOCRIT: 26.3 % — ABNORMAL LOW (ref 34.0–44.0)
HEMOGLOBIN: 9 g/dL — ABNORMAL LOW (ref 11.3–14.9)
MEAN CORPUSCULAR HEMOGLOBIN CONC: 34.2 g/dL (ref 32.0–36.0)
MEAN CORPUSCULAR HEMOGLOBIN: 32.4 pg (ref 25.9–32.4)
MEAN CORPUSCULAR VOLUME: 94.5 fL (ref 77.6–95.7)
MEAN PLATELET VOLUME: 7.8 fL (ref 6.8–10.7)
PLATELET COUNT: 150 10*9/L (ref 150–450)
RED BLOOD CELL COUNT: 2.79 10*12/L — ABNORMAL LOW (ref 3.95–5.13)
RED CELL DISTRIBUTION WIDTH: 15.5 % — ABNORMAL HIGH (ref 12.2–15.2)
WBC ADJUSTED: 2.8 10*9/L — ABNORMAL LOW (ref 3.6–11.2)

## 2022-10-17 LAB — MAGNESIUM: MAGNESIUM: 1.5 mg/dL — ABNORMAL LOW (ref 1.6–2.6)

## 2022-10-17 LAB — RENAL FUNCTION PANEL
ALBUMIN: 2.7 g/dL — ABNORMAL LOW (ref 3.4–5.0)
ANION GAP: 8 mmol/L (ref 5–14)
BLOOD UREA NITROGEN: 29 mg/dL — ABNORMAL HIGH (ref 9–23)
BUN / CREAT RATIO: 39
CALCIUM: 7.5 mg/dL — ABNORMAL LOW (ref 8.7–10.4)
CHLORIDE: 110 mmol/L — ABNORMAL HIGH (ref 98–107)
CO2: 21 mmol/L (ref 20.0–31.0)
CREATININE: 0.75 mg/dL
EGFR CKD-EPI (2021) FEMALE: 84 mL/min/{1.73_m2} (ref >=60)
GLUCOSE RANDOM: 159 mg/dL (ref 70–179)
PHOSPHORUS: 1.3 mg/dL — ABNORMAL LOW (ref 2.4–5.1)
POTASSIUM: 4.7 mmol/L (ref 3.4–4.8)
SODIUM: 139 mmol/L (ref 135–145)

## 2022-10-17 LAB — TACROLIMUS LEVEL, TROUGH: TACROLIMUS, TROUGH: 9 ng/mL (ref 5.0–15.0)

## 2022-10-17 MED ADMIN — labetalol (NORMODYNE) injection: 20 mg | INTRAVENOUS | @ 08:00:00

## 2022-10-17 MED ADMIN — amlodipine (NORVASC) tablet 5 mg: 5 mg | GASTROENTERAL | @ 01:00:00

## 2022-10-17 MED ADMIN — carvedilol (COREG) tablet 25 mg: 25 mg | GASTROENTERAL

## 2022-10-17 MED ADMIN — valGANciclovir (VALCYTE) tablet 450 mg: 450 mg | ORAL | @ 12:00:00

## 2022-10-17 MED ADMIN — fluconazole (DIFLUCAN) tablet 400 mg: 400 mg | ORAL | @ 13:00:00 | Stop: 2022-10-31

## 2022-10-17 MED ADMIN — methylPREDNISolone sodium succinate (PF) (SOLU-Medrol) injection 4 mg: 4 mg | INTRAVENOUS | @ 10:00:00 | Stop: 2022-10-17

## 2022-10-17 MED ADMIN — methocarbamol (ROBAXIN) tablet 500 mg: 500 mg | ENTERAL | @ 17:00:00

## 2022-10-17 MED ADMIN — hydrALAZINE (APRESOLINE) tablet 50 mg: 50 mg | ENTERAL | @ 12:00:00

## 2022-10-17 MED ADMIN — pantoprazole (Protonix) injection 40 mg: 40 mg | INTRAVENOUS

## 2022-10-17 MED ADMIN — tacrolimus (PROGRAF) 1.5mg combo product: 1.5 mg | SUBLINGUAL | @ 12:00:00 | Stop: 2022-10-17

## 2022-10-17 MED ADMIN — insulin lispro (HumaLOG) injection 0-20 Units: 0-20 [IU] | SUBCUTANEOUS | @ 17:00:00

## 2022-10-17 MED ADMIN — tacrolimus (PROGRAF) 1.5mg combo product: 1.5 mg | SUBLINGUAL | @ 01:00:00

## 2022-10-17 MED ADMIN — methocarbamol (ROBAXIN) tablet 500 mg: 500 mg | ENTERAL | @ 10:00:00

## 2022-10-17 MED ADMIN — heparin (porcine) 5,000 unit/mL injection 5,000 Units: 5000 [IU] | SUBCUTANEOUS | @ 10:00:00

## 2022-10-17 MED ADMIN — carvedilol (COREG) tablet 25 mg: 25 mg | GASTROENTERAL | @ 12:00:00

## 2022-10-17 MED ADMIN — mycophenolate (CELLCEPT) 500 mg in dextrose 5 % 100 mL IVPB: 500 mg | INTRAVENOUS | @ 02:00:00

## 2022-10-17 MED ADMIN — methocarbamol (ROBAXIN) tablet 500 mg: 500 mg | ENTERAL | @ 21:00:00

## 2022-10-17 MED ADMIN — heparin (porcine) 5,000 unit/mL injection 5,000 Units: 5000 [IU] | SUBCUTANEOUS | @ 18:00:00

## 2022-10-17 MED ADMIN — methocarbamol (ROBAXIN) tablet 500 mg: 500 mg | ENTERAL | @ 01:00:00

## 2022-10-17 MED ADMIN — magnesium oxide-Mg AA chelate (Magnesium Plus Protein) 2 tablet: 2 | ORAL | @ 14:00:00

## 2022-10-17 MED ADMIN — pantoprazole (Protonix) EC tablet 40 mg: 40 mg | ORAL | @ 13:00:00

## 2022-10-17 MED ADMIN — mycophenolate (CELLCEPT) 500 mg in dextrose 5 % 100 mL IVPB: 500 mg | INTRAVENOUS | @ 13:00:00 | Stop: 2022-10-17

## 2022-10-17 MED ADMIN — levothyroxine (SYNTHROID) tablet 112 mcg: 112 ug | ENTERAL | @ 10:00:00

## 2022-10-17 MED ADMIN — hydrALAZINE (APRESOLINE) tablet 50 mg: 50 mg | ENTERAL | @ 01:00:00

## 2022-10-17 MED ADMIN — melatonin tablet 6 mg: 6 mg | GASTROENTERAL | @ 21:00:00

## 2022-10-17 MED ADMIN — insulin lispro (HumaLOG) injection 0-20 Units: 0-20 [IU] | SUBCUTANEOUS | @ 02:00:00

## 2022-10-17 MED ADMIN — heparin (porcine) 5,000 unit/mL injection 5,000 Units: 5000 [IU] | SUBCUTANEOUS | @ 02:00:00

## 2022-10-17 MED ADMIN — meropenem (MERREM) 1 g in sodium chloride 0.9 % (NS) 100 mL IVPB-MBP: 1 g | INTRAVENOUS | @ 12:00:00 | Stop: 2022-10-23

## 2022-10-17 MED ADMIN — amlodipine (NORVASC) tablet 5 mg: 5 mg | GASTROENTERAL | @ 12:00:00

## 2022-10-17 MED ADMIN — atorvastatin (LIPITOR) tablet 10 mg: 10 mg | GASTROENTERAL | @ 12:00:00

## 2022-10-17 MED ADMIN — insulin lispro (HumaLOG) injection 0-20 Units: 0-20 [IU] | SUBCUTANEOUS | @ 12:00:00

## 2022-10-17 MED ADMIN — meropenem (MERREM) 1 g in sodium chloride 0.9 % (NS) 100 mL IVPB-MBP: 1 g | INTRAVENOUS | @ 01:00:00 | Stop: 2022-10-23

## 2022-10-17 NOTE — Unmapped (Signed)
General Surgery Progress Note      Assessment:   72 y.o. female with complicated previous medical history status post DCD DDKT in 07/2022, hypertension, type 2 diabetes, hypothyroidism, who was admitted for GNR bacteremia. General surgery consulted 3/17 as patient was found to have dilated loops of small bowel with multiple transition points in the RLE on CT, concerning for SBO. Continues to have high output from NGT in conjunction with failed GG challenge. Repeat CT AP 3/21 with persistent findings of high-grade SBO with worsening bowel distention. She is s/p exploratory laparotomy and reduction and small bowel volvulus on 10/13/22, transferred to [facility] on 10/14/22.     Plan:   *SBO   - s/p 3/22 ex lap w/ reduction of small bowel volvulus  - 3/24 s/p NGT pulled, having bowel movements. Regular diet   - Monitor for nausea/vomiting  - MMPC: IV tylenol, Robaxin; PRN: IVHM 0.25mg , Oxy 5  - fu w/ ID in regards to abx plan upon discharge   -currently on IV micafungin and IV meropenem   -stop IV micafungin, start oral fluconazole  - Continue meropenem, renally dosed equivalent of 1g q8h ( 2 weeks 3/19-4/1 )  -VIR consult for tunneled central line  -working with CM to send patient home with home infusion     Interval/Subjective:  NAEON, afebrile. BP initially elevated to 180-220s yesterday morning. 2x BM yesterday. Pain controlled. Patent still asking when she can leave. Discussed setting up a home abx plan. Patient is progressing great.      Objective:     Vital signs in last 24 hours:  Temp:  [36.3 ??C (97.4 ??F)-36.9 ??C (98.4 ??F)] 36.7 ??C (98.1 ??F)  Heart Rate:  [68-77] 77  SpO2 Pulse:  [68-72] 68  Resp:  [18-23] 18  BP: (122-204)/(38-78) 170/56  MAP (mmHg):  [67-97] 92  SpO2:  [98 %-100 %] 100 %    Intake/Output last 24 hours:  I/O last 3 completed shifts:  In: 1005 [P.O.:620; I.V.:60; IV Piggyback:325]  Out: 2875 [Urine:2775; Stool:100]    Physical Exam:  General: no acute distress;     Neuro: A/O x 1  Pulm: Normal respiratory effort; equal chest rise   Cardiac: regular rate and rhythm   Abdomen: Soft, appropriately tender, distention improved. Incision C/D/I       Data Review:    I have reviewed the labs and studies from the last 24 hours.      Imaging: Radiology studies were personally reviewed

## 2022-10-17 NOTE — Unmapped (Signed)
Critical Response Nurse Consult Note    Critical Response Nurse consult was ordered for Medical Equipment Troubleshooting    On [facility]    The patient???s primary language is [place].    Interpreter services were N/A    The consult was Not triggered by DI      The patient???s DI score at time of consult was 31-40    Interventions included:  Bedside Huddle and education on use of PhaSeal device.    The time frame for follow-up reassessment No follow-up is needed at this time    Primary nurse was educated to submit page at the above time frame established, but if patient exhibits any acute deviations from baseline and or have signs and symptoms of patient deterioration, the recommendation is to activate the rapid response team.    Thank you for your consult,    Cleotis Nipper, RN RN

## 2022-10-17 NOTE — Unmapped (Signed)
VASCULAR INTERVENTIONAL RADIOLOGY INPATIENT CONSULTATION     Requesting Attending Physician: Suella Broad, MD  Service Requesting Consult: [facility])    Date of Service: 10/17/2022  Consulting Interventional Radiologist: Dr. Trude Mcburney     Summary Assessment and Recommendations:     Ms. Warehime is a 72 y.o. female with complicated PMH including renal transplant in 07/2022, hypertension, type 2 diabetes, and hypothyroidism who was admitted for GNR bacteremia. She was found to have a small bowel obstruction and underwent exploratory laparotomy and reduction and small bowel volvulus on 10/13/22. She is slowly improving clinically.    VIR is consulted for management recommendations for central venous access given current IV antibiotic needs. The patient currently has a nontunneled TLC in the left neck.     Recommendations:  - The expected discharge timeline and antibiotic plan are not yet finalized. In addition, if the required IV antibiotic course is scheduled to end April 1st and the patient may very well be admitted through that time. Given these circumstances, we would not recommend putting the patient through an extra procedure to place a tunneled central venous catheter for the next few days.   - If the clinical status becomes more clear and the patient will require central venous access following discharge, please reach out to IR for tunneled catheter placement. A finalized antibiotic plan will be needed to determine the correct catheter size and type.     Please page the IR consult pager [phone]) with any questions or concerns.       Eustace Moore. Lavena Stanford, Diagnostic & Interventional Radiology  [phone]

## 2022-10-17 NOTE — Unmapped (Signed)
IMMUNOCOMPROMISED HOST INFECTIOUS DISEASE PROGRESS NOTE    Assessment/Plan:     Julie [name] is a 72 y.o. 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 Had perinephric drain in place postoperatively, removed 08/30/22  - Immunosuppression: Tacrolimus (goal 7-9), MM 500F, Pred 5  - Prophylaxis: valganciclovir x 6 months (mod risk), TMP+SMX x 6 months     Pertinent comorbidities:  ESRD was on PD and HD  DM II (A1c 5.6 on 08/06/22)  CAD  HLD  SBO 10/07/22 s/p ex lap 10/13/22 w/ lysis of adhesions     Pertinent exposures:  Originally [place]   Treated with ivermectin at the ID clinic in 2018.     Summary of pertinent prior infections:  #History of Shingles 2017  #04/2018 Dialysis fluid Serratia R: Ampicillin, Unasyn, Cefazolin,S: Ceftriaxone, Gentamicin, Levo, PipTazo, Tobra  #Donor urine cx with C. Dubliensis 08/05/22, <10K, negative recipient samples, negative donor blood cx, treated with 2 weeks PO fluconazole  #E. Coli bacteremia and UTI 08/21/22  - Ucx/Bcx 1/29 E. Coli, R amp, amp/sulb, TMP/SMX. Susc CFZ, cipro/levo, gent/tobra, nitrofurantoin, tetra  - Treated with vanc/CFP -> 3 weeks levofloxacin through 2/26 (ureteral stent removed 2/15)     Active infection:  # ESBL/FQ-R E. coli bacteremia and UTI 10/04/22, improving  # Perinephric fluid collection 10/05/22, re-accumulated after JP drain removal 08/30/22  3/12 developed fever and malaise at home, per family poor PO intake and diarrhea  - 3/14 Bcx 2/2 E. Coli; (S: Erta, Gent, Mero, Tobra, Cefepime SDD; I: ceftaz; R: Cipro/Levo, Cefazolin, Ceftriaxone, Amp)              - 3/16 BCx (NG)  - 3/14 UA 26 WBC, Ucx 10-50K E. Coli (no AST) - fosfomycin susceptible  - 3/14 Imaging:   Renal US: 8.1 x 0.8 x 2.3 cm perinephric collection new from prior, considering small hematoma, complicated seroma, or less likely abscess  CT A/P: perinephric stranding, 5.3 x 2.0 cm ill-defined perinephric collection limited evaluation without contrast. No stones seen. 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 Coli UTI/bacteremia  Continue meropenem, renally dosed equivalent of 1g q8h  Duration: 2 weeks 3/19-4/1   Plan for Fosfomycin ppx 3g once weekly after treatment completed if affordable and tolerable (can cause diarrhea); pending co-pay cost at this time     For thrush  and presumed candida esophagitis  Continue fluconazole PO renally dosed equivalent of 400 mg q24h  Duration: would plan on 14 days of treatment total       Antimicrobial prophylaxis required for host deficiency: transplant immunosuppression  Valganciclovir renally dosed equiv of 900 mg daily x 6 months (moderate risk)  TMP+SMX renally dosed equiv of 400/80 po MWF x 6 months  Needs LFT/hepB monitoring as below      Intensive toxicity monitoring for prescription antimicrobials   CBC w/diff at least once per week  CMP at least once per week  clinical assessments for rashes or other skin changes  Given hepB core+, needs LFTs q24m for first year, HBSAg and HBV DNA q30m for first year and with any rise in LFTs    Solid Organ Transplant Infectious Diseases Follow-up Instructions  Appointment: PENDING  Location: [facility]th [facility], [address] [place], [place]  Labs: weekly CBC with differential, CMP  Please fax labs to patient???s transplant coordinator: Celene Squibb at  [phone] (Kidney/Pancreas) and to [employer] (ICHID pharmD at [phone])   Antibiotics:   Meropenem IV 1g q12h (renal dose equivalent of 1g q8h)  Antibiotic End Date: 10/23/22  Fluconazole 400mg  PO BID                                                      Antibiotic End Date: 10/23/22      The [facility] ID APP service will continue to follow.           Please page Darolyn Rua, NP at [phone] from 8-4:30pm, after 4:30 pm & on weekends, please page the ID Transplant/Liquid Oncology Fellow consult at [phone] with questions.    Patient discussed with Dr. Michiel Cowboy.      Darolyn Rua, MSN, APRN, AGNP-C  Immunocompromised Infectious Disease Nurse Practitioner    I personally spent 27 minutes face-to-face and non-face-to-face in the care of this patient, which includes all pre, intra, and post visit time on the date of service.  All documented time was specific to the E/M visit and does not include any procedures that may have been performed.      Subjective:     External record(s): Consultant note(s): VIR note reviewed and noted they do not recommend a tunneled line at this time as IV treatment ends 4/1 and the patient may be admitted till that date.  .    Independent historian(s): no independent historian required.       Interval History:   Afebrile and NAEON. Patient denies shortness of breath, n/v/d. She endorses some abdominal pain that she thinks may be gas. She continues to endorse wanting to go home.     Medications:  Current Medications as of 10/17/2022  Scheduled  PRN   alteplase, 1 mg, Once  amlodipine, 5 mg, Q12H SCH  atorvastatin, 10 mg, Q24H SCH  carvedilol, 25 mg, Q12H SCH  fluconazole, 400 mg, Daily  heparin (porcine) for subcutaneous use, 5,000 Units, Q8H SCH  hydrALAZINE, 50 mg, BID  insulin glargine, 6 Units, Nightly  insulin lispro, 0-20 Units, ACHS  levothyroxine, 112 mcg, daily  magnesium oxide-Mg AA chelate, 2 tablet, BID  melatonin, 6 mg, QPM  meropenem, 1 g, Q12H  methocarbamol, 500 mg, QID  mycophenolate, 500 mg, BID  pantoprazole, 40 mg, Daily  [START ON 10/18/2022] predniSONE,  5 mg, Daily  sulfamethoxazole-trimethoprim, 1 tablet, Once per day on Monday Wednesday Friday  tacrolimus, 3 mg, BID  valGANciclovir, 450 mg, Every other day      alteplase, 2 mg, Once PRN  calcium carbonate, 200 mg of elem calcium, TID PRN  dextrose in water, 12.5 g, Q10 Min PRN  glucagon, 1 mg, Once PRN  glucose, 16 g, Q10 Min PRN  guaiFENesin, 200 mg, Q4H PRN  HYDROmorphone, 0.25 mg, Q4H PRN  labetalol, 20 mg, Q4H PRN  cough/sore throat lozenge, 1 lozenge, Q2H PRN  OLANZapine, 5 mg, Nightly PRN   Or  OLANZapine zydis, 5 mg, Nightly PRN  ondansetron, 4 mg, Q8H PRN   Or  ondansetron, 4 mg, Q8H PRN  oxyCODONE, 5 mg, Q4H PRN  phenol, 2 spray, Q2H PRN  prochlorperazine, 10 mg, Q8H PRN  simethicone, 80 mg, Q6H PRN         Objective:     Vital Signs last 24 hours:  Temp:  [36.6 ??C (97.9 ??F)-36.9 ??C (98.4 ??F)] 36.6 ??C (97.9 ??F)  Heart Rate:  [71-77] 73  Resp:  [16-20] 16  BP: (122-177)/(46-56) 172/53  MAP (mmHg):  [67-92] 89  SpO2:  [99 %-100 %] 100 %    Physical Exam:   Patient Lines/Drains/Airways Status       Active Active Lines, Drains, & Airways       Name Placement date Placement time Site Days    CVC Triple Lumen 10/13/22 Non-tunneled Internal jugular 10/13/22  1345  Internal jugular  4    Arteriovenous Fistula - Vein Graft  Access 08/24/16 Left;Upper Arm 08/24/16  --  Arm  2245                  Const [x]  vital signs above    []  NAD, non-toxic appearance []  Chronically ill-appearing, non-distressed  Sitting in chair      Eyes [x]  Lids normal bilaterally, conjunctiva anicteric and noninjected OU     [] PERRL  [] EOMI        ENMT [x]  Normal appearance of external nose and ears, no nasal discharge        [x]  MMM, no lesions on lips or gums []  No thrush, leukoplakia, oral lesions  []  Dentition good []  Edentulous []  Dental caries present  []  Hearing normal  []  TMs with good light reflexes bilaterally         Neck [x]  Neck of normal appearance and trachea midline        []  No thyromegaly, nodules, or tenderness   []  Full neck ROM        Lymph []  No LAD in neck     []  No LAD in supraclavicular area     []  No LAD in axillae   []  No LAD in epitrochlear chains     []  No LAD in inguinal areas        CV []  RRR            []  No peripheral edema     []  Pedal pulses intact   []  No abnormal heart sounds appreciated   [x]  Extremities WWP   Murmur present      Resp [x]  Normal WOB at rest    []  No breathlessness with speaking, no coughing  []  CTA anteriorly    []  CTA posteriorly          GI [x]  Normal inspection, NTND   []  NABS     []  No umbilical hernia on exam       []   No hepatosplenomegaly     []  Inspection of perineal and perianal areas normal        GU []  Normal external genitalia     [] No urinary catheter present in urethra   []  No CVA tenderness    []  No tenderness over renal allograft        MSK []  No clubbing or cyanosis of hands       []  No vertebral point tenderness  []  No focal tenderness or abnormalities on palpation of joints in RUE, LUE, RLE, or LLE        Skin [x]  No rashes, lesions, or ulcers of visualized skin     []  Skin warm and dry to palpation   RIJ with dressing c/d/I  Midline abdominal surgical incision with staples; no erythema, edema, or drainage noted; no pain with palpation      Neuro [x]  Face expression symmetric  []  Sensation to light touch grossly intact throughout    [x]  Moves extremities equally    []  No tremor noted        []  CNs II-XII grossly intact     []  DTRs normal and symmetric throughout []  Gait unremarkable        Psych [x]  Appropriate affect       []  Fluent speech         [x]  Attentive, good eye contact  []  Oriented to person, place, time          []  Judgment and insight are appropriate           Data for Medical Decision Making     10/12/22 EKG QTcF    I discussed drug options and/or interactions w/pharmD discussed with team pharmacist cost of fosfomycin oupatient .    I reviewed CBC results (WBC continuing to decrease; Platelets WNL), chemistry results (creatinine and potassium stable), and Mag and phos decreased from prior .    I independently visualized/interpreted not done.       Recent Labs   Lab Units 10/17/22  0546 10/13/22  0428 10/12/22  0716   WBC 10*9/L 2.8*   < > 3.0*   HEMOGLOBIN g/dL 9.0*   < > 9.7*   PLATELET COUNT (1) 10*9/L 150   < > 106*   BUN mg/dL 29*   < > 33* - 33*   CREATININE mg/dL 1.61   < > 0.96* - 0.45*   AST U/L  --   --  19   ALT U/L  --   --  <7*   BILIRUBIN TOTAL mg/dL  --   --  0.4   ALK PHOS U/L  --   --  45*   POTASSIUM mmol/L 4.7   < > 4.5 - 4.5   MAGNESIUM mg/dL 1.5*   < > 1.9   PHOSPHORUS mg/dL 1.3*   < > 2.2*   CALCIUM mg/dL 7.5*   < > 8.4* - 8.4*    < > = values in this interval not displayed.     Lab Results   Component Value Date    TACROLIMUS 9.0 10/17/2022         Microbiology:  Microbiology Results (last day)       ** No results found for the last 24 hours. **            Imaging:  No new imaging

## 2022-10-17 NOTE — Unmapped (Signed)
VS stable, no fever as noted. A&O x3 but very forgetful. Continued on telesitter. Denies pain. Continued on IV antibiotic. On regular diet, with poor po intake. Ambulates independently in the room. Pt expressed wanting to go home today repeatedly, explained that she is not medically ready at this time. Reports having BM. VOiding adequately. Will continue to follow POC.   Problem: Adult Inpatient Plan of Care  Goal: Plan of Care Review  Outcome: Progressing  Goal: Patient-Specific Goal (Individualized)  Outcome: Progressing  Goal: Absence of Hospital-Acquired Illness or Injury  Outcome: Progressing  Intervention: Prevent and Manage VTE (Venous Thromboembolism) Risk  Recent Flowsheet Documentation  Taken 10/17/2022 0957 by Darryl Lent, RN  VTE Prevention/Management:   anticoagulant therapy   ambulation promoted   bleeding precautions maintained  Anti-Embolism Device Type: SCD, Knee  Anti-Embolism Device Location: BLE  Goal: Optimal Comfort and Wellbeing  Outcome: Progressing  Goal: Readiness for Transition of Care  Outcome: Progressing  Goal: Rounds/Family Conference  Outcome: Progressing     Problem: Fall Injury Risk  Goal: Absence of Fall and Fall-Related Injury  Outcome: Progressing     Problem: Self-Care Deficit  Goal: Improved Ability to Complete Activities of Daily Living  Outcome: Progressing     Problem: Comorbidity Management  Goal: Maintenance of Asthma Control  Outcome: Progressing  Goal: Maintenance of Behavioral Health Symptom Control  Outcome: Progressing  Goal: Maintenance of COPD Symptom Control  Outcome: Progressing  Goal: Blood Glucose Levels Within Targeted Range  Outcome: Progressing  Goal: Maintenance of Heart Failure Symptom Control  Outcome: Progressing  Goal: Blood Pressure in Desired Range  Outcome: Progressing  Goal: Maintenance of Osteoarthritis Symptom Control  Outcome: Progressing  Goal: Bariatric Home Regimen Maintained  Outcome: Progressing  Goal: Maintenance of Seizure Control  Outcome: Progressing     Problem: Skin Injury Risk Increased  Goal: Skin Health and Integrity  Outcome: Progressing     Problem: Wound  Goal: Optimal Coping  Outcome: Progressing  Goal: Optimal Functional Ability  Outcome: Progressing  Goal: Absence of Infection Signs and Symptoms  Outcome: Progressing  Goal: Improved Oral Intake  Outcome: Progressing  Goal: Optimal Pain Control and Function  Outcome: Progressing  Goal: Skin Health and Integrity  Outcome: Progressing  Goal: Optimal Wound Healing  Outcome: Progressing

## 2022-10-17 NOTE — Unmapped (Signed)
Problem: Adult Inpatient Plan of Care  Goal: Plan of Care Review  Outcome: Progressing  Flowsheets (Taken 10/16/2022 1750)  Plan of Care Reviewed With: Julie [name]  Note: Pt admitted following SBO obstruction with volvus, experienced x2 diarrhea this shift, [facility] paged and notified, ambulated around unit with home walker, transferred to acute care.    Goal: Julie [name]-Specific Goal (Individualized)  Outcome: Progressing  Goal: Absence of Hospital-Acquired Illness or Injury  Outcome: Progressing  Intervention: Identify and Manage Fall Risk  Recent Flowsheet Documentation  Taken 10/16/2022 0800 by Laurence Compton, RN  Safety Interventions:   aspiration precautions   bed alarm   environmental modification   fall reduction program maintained   low bed   lighting adjusted for tasks/safety  Intervention: Prevent Skin Injury  Recent Flowsheet Documentation  Taken 10/16/2022 1600 by Laurence Compton, RN  Positioning for Skin: Sitting in Chair  Taken 10/16/2022 1400 by Laurence Compton, RN  Positioning for Skin: Sitting in Chair  Taken 10/16/2022 1200 by Laurence Compton, RN  Positioning for Skin: Sitting in Chair  Taken 10/16/2022 1000 by Laurence Compton, RN  Positioning for Skin: Sitting in Chair  Taken 10/16/2022 0800 by Laurence Compton, RN  Positioning for Skin: Sitting in Chair  Device Skin Pressure Protection:   absorbent pad utilized/changed   adhesive use limited   positioning supports utilized  Skin Protection:   adhesive use limited   cleansing with dimethicone incontinence wipes   incontinence pads utilized   silicone foam dressing in place   tubing/devices free from skin contact  Intervention: Prevent and Manage VTE (Venous Thromboembolism) Risk  Recent Flowsheet Documentation  Taken 10/16/2022 1600 by Laurence Compton, RN  Anti-Embolism Intervention: Off  Taken 10/16/2022 1400 by Laurence Compton, RN  Anti-Embolism Intervention: Off  Taken 10/16/2022 1200 by Laurence Compton, RN  Anti-Embolism Intervention: Off  Taken 10/16/2022 1000 by Laurence Compton, RN  Anti-Embolism Intervention: Off  Taken 10/16/2022 0800 by Laurence Compton, RN  VTE Prevention/Management:   ambulation promoted   dorsiflexion/plantar flexion performed   fluids promoted  Anti-Embolism Intervention: Off  Intervention: Prevent Infection  Recent Flowsheet Documentation  Taken 10/16/2022 0800 by Laurence Compton, RN  Infection Prevention: hand hygiene promoted  Goal: Optimal Comfort and Wellbeing  Outcome: Progressing  Goal: Readiness for Transition of Care  Outcome: Progressing  Goal: Rounds/Family Conference  Outcome: Progressing     Problem: Infection  Goal: Absence of Infection Signs and Symptoms  Outcome: Progressing  Intervention: Prevent or Manage Infection  Recent Flowsheet Documentation  Taken 10/16/2022 0800 by Laurence Compton, RN  Infection Management: aseptic technique maintained  Isolation Precautions: protective precautions maintained     Problem: Fall Injury Risk  Goal: Absence of Fall and Fall-Related Injury  Outcome: Progressing  Intervention: Promote Scientist, clinical (histocompatibility and immunogenetics) Documentation  Taken 10/16/2022 0800 by Laurence Compton, RN  Safety Interventions:   aspiration precautions   bed alarm   environmental modification   fall reduction program maintained   low bed   lighting adjusted for tasks/safety     Problem: Self-Care Deficit  Goal: Improved Ability to Complete Activities of Daily Living  Outcome: Progressing     Problem: Comorbidity Management  Goal: Maintenance of Asthma Control  Outcome: Progressing  Goal: Maintenance of Behavioral Health Symptom Control  Outcome: Progressing  Goal: Maintenance of COPD Symptom Control  Outcome: Progressing  Goal: Blood Glucose Levels Within Targeted Range  Outcome: Progressing  Goal: Maintenance  of Heart Failure Symptom Control  Outcome: Progressing  Goal: Blood Pressure in Desired Range  Outcome: Progressing  Goal: Maintenance of Osteoarthritis Symptom Control  Outcome: Progressing  Intervention: Maintain Osteoarthritis Symptom Control  Recent Flowsheet Documentation  Taken 10/16/2022 0800 by Laurence Compton, RN  Activity Management: ambulated in room  Goal: Bariatric Home Regimen Maintained  Outcome: Progressing  Goal: Maintenance of Seizure Control  Outcome: Progressing     Problem: Skin Injury Risk Increased  Goal: Skin Health and Integrity  Outcome: Progressing  Intervention: Optimize Skin Protection  Recent Flowsheet Documentation  Taken 10/16/2022 0800 by Laurence Compton, RN  Activity Management: ambulated in room  Pressure Reduction Techniques:   heels elevated off bed   positioned off wounds   weight shift assistance provided  Head of Bed (HOB) Positioning: HOB at 30-45 degrees  Pressure Reduction Devices:   pressure-redistributing mattress utilized   positioning supports utilized   foam padding utilized  Skin Protection:   adhesive use limited   cleansing with dimethicone incontinence wipes   incontinence pads utilized   silicone foam dressing in place   tubing/devices free from skin contact     Problem: Wound  Goal: Optimal Coping  Outcome: Progressing  Goal: Optimal Functional Ability  Outcome: Progressing  Intervention: Optimize Functional Ability  Recent Flowsheet Documentation  Taken 10/16/2022 0800 by Laurence Compton, RN  Activity Management: ambulated in room  Goal: Absence of Infection Signs and Symptoms  Outcome: Progressing  Intervention: Prevent or Manage Infection  Recent Flowsheet Documentation  Taken 10/16/2022 0800 by Laurence Compton, RN  Infection Management: aseptic technique maintained  Isolation Precautions: protective precautions maintained  Goal: Improved Oral Intake  Outcome: Progressing  Goal: Optimal Pain Control and Function  Outcome: Progressing  Goal: Skin Health and Integrity  Outcome: Progressing  Intervention: Optimize Skin Protection  Recent Flowsheet Documentation  Taken 10/16/2022 0800 by Laurence Compton, RN  Activity Management: ambulated in room  Pressure Reduction Techniques: heels elevated off bed   positioned off wounds   weight shift assistance provided  Head of Bed (HOB) Positioning: HOB at 30-45 degrees  Pressure Reduction Devices:   pressure-redistributing mattress utilized   positioning supports utilized   foam padding utilized  Skin Protection:   adhesive use limited   cleansing with dimethicone incontinence wipes   incontinence pads utilized   silicone foam dressing in place   tubing/devices free from skin contact  Goal: Optimal Wound Healing  Outcome: Progressing

## 2022-10-17 NOTE — Unmapped (Signed)
Tacrolimus Therapeutic Monitoring Pharmacy Note    Julie [name] is a 72 y.o. female continuing tacrolimus.     Indication: Kidney transplant     Date of Transplant:  08/06/22       Prior Dosing Information: Current regimen tacrolimus 1.5 mg SL qAM and 1.5 mg qPM     Home regimen: Prograf 2 mg BID    Source(s) of information used to determine prior to admission dosing: Home Medication List or Clinic Note    Goals:  Therapeutic Drug Levels  Tacrolimus trough goal:  7-9 ng/mL - goal confirmed with transplant nephrology attending 3/19    Additional Clinical Monitoring/Outcomes  Monitor renal function (SCr and urine output) and liver function (LFTs)  Monitor for signs/symptoms of adverse events (e.g., hyperglycemia, hyperkalemia, hypomagnesemia, hypertension, headache, tremor)    Results:   Tacrolimus level:  9 ng/mL, drawn appropriately ~9 hour level    Pharmacokinetic Considerations and Significant Drug Interactions:  Concurrent hepatotoxic medications: None identified  Concurrent CYP3A4 substrates/inhibitors: None identified  Concurrent nephrotoxic medications: None identified    Assessment/Plan:  Recommendedation(s)  Start 3 mg PO BID, confirmed with transplant nephrology service    Follow-up  Daily levels currently ordered .   A pharmacist will continue to monitor and recommend levels as appropriate    Please page service pharmacist with questions/clarifications.    Charlotte Crumb, PharmD

## 2022-10-17 NOTE — Unmapped (Signed)
Problem: Adult Inpatient Plan of Care  Goal: Plan of Care Review  Outcome: Progressing  Goal: Patient-Specific Goal (Individualized)  Outcome: Progressing  Goal: Absence of Hospital-Acquired Illness or Injury  Outcome: Progressing  Goal: Optimal Comfort and Wellbeing  Outcome: Progressing  Goal: Readiness for Transition of Care  Outcome: Progressing  Goal: Rounds/Family Conference  Outcome: Progressing     Problem: Infection  Goal: Absence of Infection Signs and Symptoms  Outcome: Progressing     Problem: Fall Injury Risk  Goal: Absence of Fall and Fall-Related Injury  Outcome: Progressing     Problem: Self-Care Deficit  Goal: Improved Ability to Complete Activities of Daily Living  Outcome: Progressing     Problem: Comorbidity Management  Goal: Maintenance of Asthma Control  Outcome: Progressing  Goal: Maintenance of Behavioral Health Symptom Control  Outcome: Progressing  Goal: Maintenance of COPD Symptom Control  Outcome: Progressing  Goal: Blood Glucose Levels Within Targeted Range  Outcome: Progressing  Goal: Maintenance of Heart Failure Symptom Control  Outcome: Progressing  Goal: Blood Pressure in Desired Range  Outcome: Progressing  Goal: Maintenance of Osteoarthritis Symptom Control  Outcome: Progressing  Goal: Bariatric Home Regimen Maintained  Outcome: Progressing  Goal: Maintenance of Seizure Control  Outcome: Progressing     Problem: Skin Injury Risk Increased  Goal: Skin Health and Integrity  Outcome: Progressing     Problem: Wound  Goal: Optimal Coping  Outcome: Progressing  Goal: Optimal Functional Ability  Outcome: Progressing  Goal: Absence of Infection Signs and Symptoms  Outcome: Progressing  Goal: Improved Oral Intake  Outcome: Progressing  Goal: Optimal Pain Control and Function  Outcome: Progressing  Goal: Skin Health and Integrity  Outcome: Progressing  Goal: Optimal Wound Healing  Outcome: Progressing

## 2022-10-17 NOTE — Unmapped (Signed)
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 hospital course complicated with small bowel obstruction.    Kidney transplant 08/06/22   Mild non oliguric AKI resolved to baseline creatinine 1.2-1.4 mg/dl.     Immunosuppression [High risk medical decision making for drug therapy requiring intensive monitoring for toxicity]  - Tacrolimus was decreased from 1.5/1 mg SL to 1.5 mg SL daily on 3/24 for elevated troughs of 11 and 10.4 on 3/22 and 3/23. 3/24 level was a touch low at 5.9. Now increased back to 1.5 mg BID. Goal trough is 7-9.   - Will transition to PO tacrolimus 3 mg BID tonight and adjust based on tomorrow's trough   - IV mycophenolate 500mg  bid - transition to PO mycophenolate 500 mg BID  - IV methylprednisolone 4mg  daily - transition to PO pred 5 mg daily     E.coli bacteremia from complicated urinary tract infection +/-transplant pyelonephritis  Repeat blood cultures 3/16 with no growth to date. Perinephric fluid aspiration 3/19 no growth to date  - Appreciate ICID input   - Meropenem 3/15 - TBD (min 2 week course)   - Recommend powerline over PICC with recent transplant   - Plan outpatient referral to urogynecology for evaluation and mgmt recurrent urine tract infection    Small bowel obstruction  - S/p ex-lap and reduction on 10/13/22  - soft GI diet started on 3/24     Hypertension  I solated systolic hypertension with wide pulse pressure and low diastolic readings make mgmt challenging without causing lightheadedness and increasing risk for falls, organ hypoperfusion. We have weaned off clonidine this admission.   - Amlodipine 5 mg BID, coreg 25 mg BID, hydralazine 50 mg BID       Infectious prophylaxis and monitoring:   CMV D+/R+, EBV D+/R+  valganciclovir 450mg  daily x87m 1-4/24   trimethoprim/sulfamethoxazole 80/400mg  3x/week x81m 1-7/24  micafungin 150mg  daily vs oral +/-esophageal candidiasis    Florian Buff, MD  Division of Nephrology and Hypertension  [facility]  10/17/2022  _______________________________________________________________________________    Kidney Transplant History:   Date of Transplant: 08/06/2022 (Kidney)  KDPI: 56%  Cold ischemic time: 877 minutes (14 hr 37 min). Warm ischemic time: 32 minutes  cPRA: 0%  HLA match: Blood type: Donor A1, Recipient A POS  ID: CMV D+/R+, EBV D+/R+  Native Kidney Disease: Diabetes              Native kidney biopsy: Diabetic nephropathy (done elsewhere)              Pre-transplant dialysis course: PD started July 2017, transitioned to HD October 2019 due to catheter dysfunction/inadequate dialysis  Induction: Basiliximab with steroid maintenance    subjective: Pt doing well this morning, expressing desire to discharge. She reports tolerating PO without n/v.     medications:   Current Facility-Administered Medications   Medication Dose Route Frequency    amlodipine (NORVASC) tablet 5 mg  5 mg Oral Q12H SCH    atorvastatin (LIPITOR) tablet 10 mg  10 mg Oral Q24H SCH    calcium carbonate (TUMS) chewable tablet 200 mg of elem calcium  200 mg of elem calcium Oral TID PRN    carvedilol (COREG) tablet 25 mg  25 mg  Oral Q12H SCH    cloNIDine HCL (CATAPRES) tablet 0.1 mg  0.1 mg Oral Nightly    dextrose (D10W) 10% bolus 125 mL  12.5 g Intravenous Q10 Min PRN    glucagon injection 1 mg  1 mg Intramuscular Once PRN    glucose chewable tablet 16 g  16 g Oral Q10 Min PRN    guaiFENesin (ROBITUSSIN) oral syrup  200 mg Oral Q4H PRN    heparin (porcine) 5,000 unit/mL injection 5,000 Units  5,000 Units Subcutaneous Q8H SCH    hydrALAZINE (APRESOLINE) tablet 25 mg  25 mg Oral BID    HYDROmorphone (PF) injection Syrg 0.25 mg  0.25 mg Intravenous Q4H PRN    insulin glargine (LANTUS) injection 6 Units  6 Units Subcutaneous Nightly    insulin lispro (HumaLOG) injection 0-20 Units  0-20 Units Subcutaneous ACHS    labetalol (NORMODYNE) injection  20 mg Intravenous Q4H PRN    levothyroxine (SYNTHROID) tablet 112 mcg  112 mcg Oral daily    magnesium oxide-Mg AA chelate (Magnesium Plus Protein) 2 tablet  2 tablet Oral BID    melatonin tablet 3 mg  3 mg Oral Nightly PRN    meropenem (MERREM) 1 g in sodium chloride 0.9 % (NS) 100 mL IVPB-MBP  1 g Intravenous Q12H    methylPREDNISolone sodium succinate (PF) (SOLU-Medrol) injection 5.2 mg  5.2 mg Intravenous Q24H    micafungin (MYCAMINE) 150 mg in sodium chloride (NS) 0.9 % 100 mL IVPB  150 mg Intravenous Q24H    mycophenolate (CELLCEPT) 500 mg in dextrose 5 % 100 mL IVPB  500 mg Intravenous Q12H    ondansetron (ZOFRAN-ODT) disintegrating tablet 4 mg  4 mg Oral Q8H PRN    Or    ondansetron (ZOFRAN) injection 4 mg  4 mg Intravenous Q8H PRN    pantoprazole (Protonix) EC tablet 40 mg  40 mg Oral Daily    phenol (CHLORASEPTIC) 1.4 % spray 2 spray  2 spray Mucous Membrane Q2H PRN    prochlorperazine (COMPAZINE) injection 10 mg  10 mg Intramuscular Q8H PRN    simethicone (MYLICON) chewable tablet 80 mg  80 mg Oral Q6H PRN    sodium chloride (NS) 0.9 % infusion  75 mL/hr Intravenous Continuous    sulfamethoxazole-trimethoprim (BACTRIM) 400-80 mg tablet 80 mg of trimethoprim  1 tablet Oral Once per day on Monday Wednesday Friday    tacrolimus (PROGRAF) 1.5mg  combo product  1.5 mg Sublingual Daily    tacrolimus (PROGRAF) capsule 1 mg  1 mg Sublingual Nightly    valGANciclovir (VALCYTE) tablet 450 mg  450 mg Oral Every other day      ALLERGIES  Oxycodone, Gabapentin, and Valacyclovir    physical exam:  Vitals:    10/13/22 0744   BP: 158/52   Pulse: 75   Resp: 17   Temp: 97.7   SpO2: 97%     Intake/Output Summary (Last 24 hours) at 10/13/2022 1035  Last data filed at 10/13/2022 0400  Gross per 24 hour   Intake 0 ml   Output 1450 ml   Net -1450 ml     General: seated in chair no acute distress.  HEENT: anicteric sclera, MMM  CV RRR, + systolic murmur   Lungs: no increased work of breathing  Abdomen: soft non tender mild distended  Skin: no visible lesions or rashes  Neuro: alert oriented x3. non focal exam.    Test Results  Lab Results   Component Value Date    WBC  2.8 (L) 10/17/2022    HGB 9.0 (L) 10/17/2022    HCT 26.3 (L) 10/17/2022    PLT 150 10/17/2022     Lab Results   Component Value Date    NA 139 10/17/2022    K 4.7 10/17/2022    CL 110 (H) 10/17/2022    CO2 21.0 10/17/2022    BUN 29 (H) 10/17/2022    CREATININE 0.75 10/17/2022    GLU 159 10/17/2022    CALCIUM 7.5 (L) 10/17/2022    MG 1.5 (L) 10/17/2022    PHOS 1.3 (L) 10/17/2022      BILITOT 0.4 10/12/2022    PROT 5.5 (L) 10/12/2022    ALBUMIN 3.3 (L) 10/13/2022    ALT <7 (L) 10/12/2022    AST 19 10/12/2022    ALKPHOS 45 (L) 10/12/2022

## 2022-10-18 LAB — CBC
HEMATOCRIT: 25.1 % — ABNORMAL LOW (ref 34.0–44.0)
HEMOGLOBIN: 8.5 g/dL — ABNORMAL LOW (ref 11.3–14.9)
MEAN CORPUSCULAR HEMOGLOBIN CONC: 33.8 g/dL (ref 32.0–36.0)
MEAN CORPUSCULAR HEMOGLOBIN: 32.2 pg (ref 25.9–32.4)
MEAN CORPUSCULAR VOLUME: 95.1 fL (ref 77.6–95.7)
MEAN PLATELET VOLUME: 8.1 fL (ref 6.8–10.7)
PLATELET COUNT: 123 10*9/L — ABNORMAL LOW (ref 150–450)
RED BLOOD CELL COUNT: 2.64 10*12/L — ABNORMAL LOW (ref 3.95–5.13)
RED CELL DISTRIBUTION WIDTH: 15.4 % — ABNORMAL HIGH (ref 12.2–15.2)
WBC ADJUSTED: 2.5 10*9/L — ABNORMAL LOW (ref 3.6–11.2)

## 2022-10-18 LAB — RENAL FUNCTION PANEL
ALBUMIN: 2.7 g/dL — ABNORMAL LOW (ref 3.4–5.0)
ANION GAP: 6 mmol/L (ref 5–14)
BLOOD UREA NITROGEN: 23 mg/dL (ref 9–23)
BUN / CREAT RATIO: 30
CALCIUM: 7.8 mg/dL — ABNORMAL LOW (ref 8.7–10.4)
CHLORIDE: 112 mmol/L — ABNORMAL HIGH (ref 98–107)
CO2: 23 mmol/L (ref 20.0–31.0)
CREATININE: 0.77 mg/dL
EGFR CKD-EPI (2021) FEMALE: 82 mL/min/{1.73_m2} (ref >=60)
GLUCOSE RANDOM: 103 mg/dL (ref 70–179)
PHOSPHORUS: 1.5 mg/dL — ABNORMAL LOW (ref 2.4–5.1)
POTASSIUM: 5 mmol/L — ABNORMAL HIGH (ref 3.4–4.8)
SODIUM: 141 mmol/L (ref 135–145)

## 2022-10-18 LAB — MAGNESIUM: MAGNESIUM: 1.5 mg/dL — ABNORMAL LOW (ref 1.6–2.6)

## 2022-10-18 LAB — TACROLIMUS LEVEL, TROUGH: TACROLIMUS, TROUGH: 17.2 ng/mL — ABNORMAL HIGH (ref 5.0–15.0)

## 2022-10-18 MED ORDER — FOSFOMYCIN TROMETHAMINE 3 GRAM ORAL PACKET
ORAL | 0 refills | 28 days
Start: 2022-10-18 — End: 2022-10-18

## 2022-10-18 MED ADMIN — hydrALAZINE (APRESOLINE) tablet 50 mg: 50 mg | ORAL | @ 14:00:00

## 2022-10-18 MED ADMIN — fluconazole (DIFLUCAN) tablet 400 mg: 400 mg | ORAL | @ 14:00:00 | Stop: 2022-10-31

## 2022-10-18 MED ADMIN — carvedilol (COREG) tablet 25 mg: 25 mg | GASTROENTERAL | @ 01:00:00

## 2022-10-18 MED ADMIN — OLANZapine zydis (ZyPREXA) disintegrating tablet 5 mg: 5 mg | SUBLINGUAL | @ 02:00:00 | Stop: 2022-10-17

## 2022-10-18 MED ADMIN — mycophenolate (CELLCEPT) tablet 500 mg: 500 mg | ORAL | @ 01:00:00

## 2022-10-18 MED ADMIN — insulin glargine (LANTUS) injection 6 Units: 6 [IU] | SUBCUTANEOUS | @ 02:00:00

## 2022-10-18 MED ADMIN — levothyroxine (SYNTHROID) tablet 112 mcg: 112 ug | ENTERAL | @ 10:00:00 | Stop: 2022-10-18

## 2022-10-18 MED ADMIN — sulfamethoxazole-trimethoprim (BACTRIM) 400-80 mg tablet 80 mg of trimethoprim: 1 | ORAL | @ 14:00:00 | Stop: 2023-01-15

## 2022-10-18 MED ADMIN — oxyCODONE (ROXICODONE) immediate release tablet 5 mg: 5 mg | ORAL | @ 14:00:00 | Stop: 2022-11-01

## 2022-10-18 MED ADMIN — tacrolimus (PROGRAF) capsule 3 mg: 3 mg | ORAL | @ 14:00:00 | Stop: 2022-10-18

## 2022-10-18 MED ADMIN — hydrALAZINE (APRESOLINE) tablet 50 mg: 50 mg | ENTERAL | @ 01:00:00

## 2022-10-18 MED ADMIN — carvedilol (COREG) tablet 25 mg: 25 mg | ORAL | @ 14:00:00

## 2022-10-18 MED ADMIN — methocarbamol (ROBAXIN) tablet 500 mg: 500 mg | ENTERAL | @ 01:00:00

## 2022-10-18 MED ADMIN — tacrolimus (PROGRAF) capsule 3 mg: 3 mg | ORAL | @ 01:00:00

## 2022-10-18 MED ADMIN — amlodipine (NORVASC) tablet 5 mg: 5 mg | ORAL | @ 14:00:00

## 2022-10-18 MED ADMIN — meropenem (MERREM) 1 g in sodium chloride 0.9 % (NS) 100 mL IVPB-MBP: 1 g | INTRAVENOUS | @ 14:00:00 | Stop: 2022-10-23

## 2022-10-18 MED ADMIN — methocarbamol (ROBAXIN) tablet 500 mg: 500 mg | ORAL | @ 21:00:00

## 2022-10-18 MED ADMIN — magnesium oxide-Mg AA chelate (Magnesium Plus Protein) 2 tablet: 2 | ORAL | @ 01:00:00

## 2022-10-18 MED ADMIN — methocarbamol (ROBAXIN) tablet 500 mg: 500 mg | ENTERAL | @ 10:00:00 | Stop: 2022-10-18

## 2022-10-18 MED ADMIN — atorvastatin (LIPITOR) tablet 10 mg: 10 mg | ORAL | @ 14:00:00

## 2022-10-18 MED ADMIN — insulin lispro (HumaLOG) injection 0-20 Units: 0-20 [IU] | SUBCUTANEOUS | @ 21:00:00

## 2022-10-18 MED ADMIN — heparin (porcine) 5,000 unit/mL injection 5,000 Units: 5000 [IU] | SUBCUTANEOUS | @ 10:00:00

## 2022-10-18 MED ADMIN — meropenem (MERREM) 1 g in sodium chloride 0.9 % (NS) 100 mL IVPB-MBP: 1 g | INTRAVENOUS | @ 02:00:00 | Stop: 2022-10-23

## 2022-10-18 MED ADMIN — methocarbamol (ROBAXIN) tablet 500 mg: 500 mg | ORAL | @ 17:00:00

## 2022-10-18 MED ADMIN — heparin (porcine) 5,000 unit/mL injection 5,000 Units: 5000 [IU] | SUBCUTANEOUS | @ 18:00:00

## 2022-10-18 MED ADMIN — pantoprazole (Protonix) EC tablet 40 mg: 40 mg | ORAL | @ 14:00:00

## 2022-10-18 MED ADMIN — magnesium oxide-Mg AA chelate (Magnesium Plus Protein) 2 tablet: 2 | ORAL | @ 14:00:00

## 2022-10-18 MED ADMIN — predniSONE (DELTASONE) tablet 5 mg: 5 mg | ORAL | @ 14:00:00

## 2022-10-18 MED ADMIN — amlodipine (NORVASC) tablet 5 mg: 5 mg | GASTROENTERAL | @ 01:00:00

## 2022-10-18 MED ADMIN — HYDROmorphone (PF) injection Syrg 0.25 mg: .25 mg | INTRAVENOUS | @ 02:00:00 | Stop: 2022-10-21

## 2022-10-18 MED ADMIN — mycophenolate (CELLCEPT) tablet 500 mg: 500 mg | ORAL | @ 14:00:00

## 2022-10-18 NOTE — Unmapped (Signed)
IMMUNOCOMPROMISED HOST INFECTIOUS DISEASE PROGRESS NOTE    Assessment/Plan:     Julie [name] is a 72 y.o. 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 Had perinephric drain in place postoperatively, removed 08/30/22  - Immunosuppression: Tacrolimus (goal 7-9), MM 500F, Pred 5  - Prophylaxis: valganciclovir x 6 months (mod risk), TMP+SMX x 6 months     Pertinent comorbidities:  ESRD was on PD and HD  DM II (A1c 5.6 on 08/06/22)  CAD  HLD  SBO 10/07/22 s/p ex lap 10/13/22 w/ lysis of adhesions     Pertinent exposures:  Originally from [place]   Treated with ivermectin at the ID clinic in 2018.     Summary of pertinent prior infections:  #History of Shingles 2017  #04/2018 Dialysis fluid Serratia R: Ampicillin, Unasyn, Cefazolin,S: Ceftriaxone, Gentamicin, Levo, PipTazo, Tobra  #Donor urine cx with C. Dubliensis 08/05/22, <10K, negative recipient samples, negative donor blood cx, treated with 2 weeks PO fluconazole  #E. Coli bacteremia and UTI 08/21/22  - Ucx/Bcx 1/29 E. Coli, R amp, amp/sulb, TMP/SMX. Susc CFZ, cipro/levo, gent/tobra, nitrofurantoin, tetra  - Treated with vanc/CFP -> 3 weeks levofloxacin through 2/26 (ureteral stent removed 2/15)     Active infection:  # ESBL/FQ-R E. coli bacteremia and UTI 10/04/22, improving  # Perinephric fluid collection 10/05/22, re-accumulated after JP drain removal 08/30/22  3/12 developed fever and malaise at home, per family poor PO intake and diarrhea  - 3/14 Bcx 2/2 E. Coli; (S: Erta, Gent, Mero, Tobra, Cefepime SDD; I: ceftaz; R: Cipro/Levo, Cefazolin, Ceftriaxone, Amp)              - 3/16 BCx (NG)  - 3/14 UA 26 WBC, Ucx 10-50K E. Coli (no AST) - fosfomycin susceptible  - 3/14 Imaging:   Renal US: 8.1 x 0.8 x 2.3 cm perinephric collection new from prior, considering small hematoma, complicated seroma, or less likely abscess  CT A/P: perinephric stranding, 5.3 x 2.0 cm ill-defined perinephric collection limited evaluation without contrast. No stones seen. 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 Diff but she had recent negative testing     Management  For E. Coli UTI/bacteremia  Continue meropenem, renally dosed equivalent of 1g q8h  Duration: 2 weeks 3/19-4/1   Fosfomycin not an option due to cost  Patient to stay admitted while getting antibiotics.  For thrush and presumed candida esophagitis  Continue fluconazole PO renally dosed equivalent of 400 mg q24h  Duration: would plan on 14 days of treatment total       Antimicrobial prophylaxis required for host deficiency: transplant immunosuppression  Valganciclovir renally dosed equiv of 900 mg daily x 6 months (moderate risk)  TMP+SMX renally dosed equiv of 400/80 po MWF x 6 months  Needs LFT/hepB monitoring as below      Intensive toxicity monitoring for prescription antimicrobials   CBC w/diff at least once per week  CMP at least once per week  clinical assessments for rashes or other skin changes  Given hepB core+, needs LFTs q10m for first year, HBSAg and HBV DNA q44m for first year and with any rise in LFTs    Solid Organ Transplant Infectious Diseases Follow-up Instructions  Appointment: 11/20/22 at 12:00pm  Location: [facility], [address] [place], [place]    The [facility] ID APP service will sign off and arrange outpatient ID follow up.           Please page Darolyn Rua, NP at [phone] from 8-4:30pm, after 4:30 pm & on weekends, please page the ID Transplant/Liquid Oncology Fellow consult at [phone] with questions.    Patient discussed with Dr. Michiel Cowboy.      Darolyn Rua, MSN, APRN, AGNP-C  Immunocompromised Infectious Disease Nurse Practitioner    I personally spent 13 minutes face-to-face and non-face-to-face in the care of this patient, which includes all pre, intra, and post visit time on the date of service.  All documented time was specific to the E/M visit and does not include any procedures that may have been performed.      Subjective:     External record(s): Consultant note(s): Transplant nephrology note reviewed and noted recommendation against PICC line due to kidney transplant .    Independent historian(s): no independent historian required.       Interval History:   Afebrile and NAEON. Patient denied n/v/d, burning with urination. Continues to endorse wanting to go home.     Medications:  Current Medications as of 10/18/2022  Scheduled  PRN   alteplase, 1 mg, Once  amlodipine, 5 mg, Q12H SCH  atorvastatin, 10 mg, Q24H SCH  carvedilol, 25 mg, Q12H SCH  fluconazole, 400 mg, Daily  haloperidol LACTATE, 1 mg, Once  heparin (porcine) for subcutaneous use, 5,000 Units, Q8H SCH  hydrALAZINE, 50 mg, BID  insulin glargine, 6 Units, Nightly  insulin lispro, 0-20 Units, ACHS  [START ON 10/19/2022] levothyroxine, 112 mcg, daily  magnesium oxide-Mg AA chelate, 2 tablet, BID  melatonin, 6 mg, QPM  meropenem, 1 g, Q12H  methocarbamol, 500 mg, QID  mycophenolate, 500 mg, BID  pantoprazole, 40 mg, Daily  predniSONE, 5 mg, Daily  sulfamethoxazole-trimethoprim, 1 tablet, Once per day on Monday Wednesday Friday  tacrolimus, 3 mg, BID  valGANciclovir, 450 mg, Every other day      calcium carbonate, 200 mg of elem calcium, TID PRN  dextrose in water, 12.5 g, Q10 Min PRN  glucagon, 1 mg, Once PRN  glucose, 16 g, Q10 Min PRN  guaiFENesin, 200 mg, Q4H PRN  labetalol, 20 mg, Q4H PRN  cough/sore throat lozenge, 1 lozenge, Q2H PRN  ondansetron, 4 mg, Q8H PRN   Or  ondansetron, 4 mg, Q8H PRN  oxyCODONE, 2.5 mg, Q4H PRN   Or  oxyCODONE, 5 mg, Q4H PRN  phenol, 2 spray, Q2H PRN  prochlorperazine, 10 mg, Q8H PRN  simethicone,  80 mg, Q6H PRN         Objective:     Vital Signs last 24 hours:  Temp:  [36.6 ??C (97.9 ??F)-37.2 ??C (99 ??F)] 36.6 ??C (97.9 ??F)  Heart Rate:  [68-75] 68  Resp:  [15-18] 18  BP: (147-185)/(46-68) 156/46  MAP (mmHg):  [78-100] 78  SpO2:  [99 %-100 %] 100 %    Physical Exam:   Patient Lines/Drains/Airways Status       Active Active Lines, Drains, & Airways       Name Placement date Placement time Site Days    CVC Triple Lumen 10/13/22 Non-tunneled Internal jugular 10/13/22  1345  Internal jugular  5    Arteriovenous Fistula - Vein Graft  Access 08/24/16 Left;Upper Arm 08/24/16  --  Arm  2246                  Const [x]  vital signs above    []  NAD, non-toxic appearance []  Chronically ill-appearing, non-distressed  Sitting in chair; family at bedside      Eyes [x]  Lids normal bilaterally, conjunctiva anicteric and noninjected OU     [] PERRL  [] EOMI        ENMT [x]  Normal appearance of external nose and ears, no nasal discharge        [x]  MMM, no lesions on lips or gums []  No thrush, leukoplakia, oral lesions  []  Dentition good []  Edentulous []  Dental caries present  []  Hearing normal  []  TMs with good light reflexes bilaterally         Neck [x]  Neck of normal appearance and trachea midline        []  No thyromegaly, nodules, or tenderness   []  Full neck ROM        Lymph []  No LAD in neck     []  No LAD in supraclavicular area     []  No LAD in axillae   []  No LAD in epitrochlear chains     []  No LAD in inguinal areas        CV []  RRR            []  No peripheral edema     []  Pedal pulses intact   []  No abnormal heart sounds appreciated   [x]  Extremities WWP   Murmur present      Resp [x]  Normal WOB at rest    []  No breathlessness with speaking, no coughing  []  CTA anteriorly    []  CTA posteriorly          GI [x]  Normal inspection, NTND   []  NABS     []  No umbilical hernia on exam       []  No hepatosplenomegaly     []  Inspection of perineal and perianal areas normal        GU []  Normal external genitalia     [] No urinary catheter present in urethra   []  No CVA tenderness    []  No tenderness over renal allograft        MSK []  No clubbing or cyanosis of hands       []  No vertebral point tenderness  []  No focal tenderness or abnormalities on palpation of joints in RUE, LUE, RLE, or LLE        Skin [x]  No rashes, lesions, or ulcers of visualized skin     []  Skin warm and dry to palpation   RIJ with dressing c/d/I  Midline abdominal surgical incision with staples; no  erythema, edema, or drainage noted; no pain with palpation      Neuro [x]  Face expression symmetric  []  Sensation to light touch grossly intact throughout    [x]  Moves extremities equally    []  No tremor noted        []  CNs II-XII grossly intact     []  DTRs normal and symmetric throughout []  Gait unremarkable        Psych [x]  Appropriate affect       []  Fluent speech         [x]  Attentive, good eye contact  []  Oriented to person, place, time          []  Judgment and insight are appropriate           Data for Medical Decision Making     10/12/22 EKG QTcF    I discussed not done.    I reviewed CBC results (WBC and platelets decreased from prior), chemistry results (hyperkalemia today with stable creatinine), and Mag/phos both remain decreased .    I independently visualized/interpreted not done.       Recent Labs   Lab Units 10/18/22  0538 10/13/22  0428 10/12/22  0716   WBC 10*9/L 2.5*   < > 3.0*   HEMOGLOBIN g/dL 8.5*   < > 9.7*   PLATELET COUNT (1) 10*9/L 123*   < > 106*   BUN mg/dL 23   < > 33* - 33*   CREATININE mg/dL 1.61   < > 0.96* - 0.45*   AST U/L  --   --  19   ALT U/L  --   --  <7*   BILIRUBIN TOTAL mg/dL  --   --  0.4   ALK PHOS U/L  --   --  45*   POTASSIUM mmol/L 5.0*   < > 4.5 - 4.5   MAGNESIUM mg/dL 1.5*   < > 1.9   PHOSPHORUS mg/dL 1.5*   < > 2.2*   CALCIUM mg/dL 7.8*   < > 8.4* - 8.4*    < > = values in this interval not displayed.     Lab Results   Component Value Date    TACROLIMUS 17.2 (H) 10/18/2022         Microbiology:  Microbiology Results (last day)       ** No results found for the last 24 hours. **             Imaging:  No new imaging

## 2022-10-18 NOTE — Unmapped (Signed)
A & O x 4 with some forgetfulness. Pleasant & cooperative with care. On a regular diet, no N/V reported this shift. Ambulating with SBA. Her VS are WNL. Her midline incision is intact. Tele sitter is on going/in place, pt & daughter/family member is very upset with the 1 on 1 sitter, per daughter she needs to be contacted prior to switching to 1 on 1 sitter. Pt is voiding independently. Pain is manage with PO Oxy, given x 1 this shift. Call bell within reach. Will continue to monitor. Daughter/family at bedside.   Problem: Adult Inpatient Plan of Care  Goal: Plan of Care Review  Outcome: Progressing  Goal: Patient-Specific Goal (Individualized)  Outcome: Progressing  Goal: Absence of Hospital-Acquired Illness or Injury  Outcome: Progressing  Intervention: Identify and Manage Fall Risk  Recent Flowsheet Documentation  Taken 10/18/2022 0830 by Elio Forget, RN  Safety Interventions:   fall reduction program maintained   family at bedside   lighting adjusted for tasks/safety   low bed  Intervention: Prevent Skin Injury  Recent Flowsheet Documentation  Taken 10/18/2022 0830 by Lyndon Code D, RN  Positioning for Skin: Supine/Back  Skin Protection: incontinence pads utilized  Goal: Optimal Comfort and Wellbeing  Outcome: Progressing  Goal: Readiness for Transition of Care  Outcome: Progressing  Goal: Rounds/Family Conference  Outcome: Progressing     Problem: Infection  Goal: Absence of Infection Signs and Symptoms  Outcome: Progressing     Problem: Fall Injury Risk  Goal: Absence of Fall and Fall-Related Injury  Outcome: Progressing  Intervention: Promote Injury-Free Environment  Recent Flowsheet Documentation  Taken 10/18/2022 0830 by Lyndon Code D, RN  Safety Interventions:   fall reduction program maintained   family at bedside   lighting adjusted for tasks/safety   low bed     Problem: Self-Care Deficit  Goal: Improved Ability to Complete Activities of Daily Living  Outcome: Progressing     Problem: Comorbidity Management  Goal: Maintenance of Asthma Control  Outcome: Progressing  Goal: Maintenance of Behavioral Health Symptom Control  Outcome: Progressing  Goal: Maintenance of COPD Symptom Control  Outcome: Progressing  Goal: Blood Glucose Levels Within Targeted Range  Outcome: Progressing  Goal: Maintenance of Heart Failure Symptom Control  Outcome: Progressing  Goal: Blood Pressure in Desired Range  Outcome: Progressing  Goal: Maintenance of Osteoarthritis Symptom Control  Outcome: Progressing  Goal: Bariatric Home Regimen Maintained  Outcome: Progressing  Goal: Maintenance of Seizure Control  Outcome: Progressing     Problem: Skin Injury Risk Increased  Goal: Skin Health and Integrity  Outcome: Progressing  Intervention: Optimize Skin Protection  Recent Flowsheet Documentation  Taken 10/18/2022 0830 by Lyndon Code D, RN  Pressure Reduction Techniques: frequent weight shift encouraged  Pressure Reduction Devices: pressure-redistributing mattress utilized  Skin Protection: incontinence pads utilized     Problem: Wound  Goal: Optimal Coping  Outcome: Progressing  Goal: Optimal Functional Ability  Outcome: Progressing  Goal: Absence of Infection Signs and Symptoms  Outcome: Progressing  Goal: Improved Oral Intake  Outcome: Progressing  Goal: Optimal Pain Control and Function  Outcome: Progressing  Goal: Skin Health and Integrity  Outcome: Progressing  Intervention: Optimize Skin Protection  Recent Flowsheet Documentation  Taken 10/18/2022 0830 by Lyndon Code D, RN  Pressure Reduction Techniques: frequent weight shift encouraged  Pressure Reduction Devices: pressure-redistributing mattress utilized  Skin Protection: incontinence pads utilized  Goal: Optimal Wound Healing  Outcome: Progressing

## 2022-10-18 NOTE — Unmapped (Signed)
CVAD Liaison - Partial Occlusion Note    CVAD Liaison Nurse was consulted for partial occlusion of the white and blue lumen. At the bedside, pulsatile flushes were performed. Blood return was confirmed. Educated primary RN of push pause flushes.      Thank you for this consult,  Herbert Seta, RN, CVAD Liaison     Consult Time 15 minutes (min)

## 2022-10-18 NOTE — Unmapped (Signed)
General Surgery Progress Note      Assessment:   72 y.o. female with complicated previous medical history status post DCD DDKT in 07/2022, hypertension, type 2 diabetes, hypothyroidism, who was admitted for GNR bacteremia. General surgery consulted 3/17 as patient was found to have dilated loops of small bowel with multiple transition points in the RLE on CT, concerning for SBO. Continues to have high output from NGT in conjunction with failed GG challenge. Repeat CT AP 3/21 with persistent findings of high-grade SBO with worsening bowel distention. She is s/p exploratory laparotomy and reduction and small bowel volvulus on 10/13/22, transferred to [facility] on 10/14/22.     Plan:   *SBO   - s/p 3/22 ex lap w/ reduction of small bowel volvulus  - 3/24 s/p NGT pulled, having bowel movements. Regular diet   - Monitor for nausea/vomiting  - MMPC: IV tylenol, Robaxin; PRN: IVHM 0.25mg , Oxy 5  - fu w/ ID in regards to abx plan upon discharge   -currently on IV micafungin and IV meropenem   -stop IV micafungin, start oral fluconazole  - Continue meropenem, renally dosed equivalent of 1g q8h ( 2 weeks 3/19-4/1 )  -patient will stay in the hospital until 4/1 for her IV abx treatment     Interval/Subjective:  NAEON, afebrile. Pain controlled. Patent still asking when she can leave. Discussed setting up a home abx plan. Patient is progressing great.  Discussed with patient and her family about staying in the hospital until 4/1 for the remainder of her IV abx treatment.     Objective:     Vital signs in last 24 hours:  Temp:  [36.6 ??C (97.9 ??F)-37.2 ??C (99 ??F)] 36.6 ??C (97.9 ??F)  Heart Rate:  [68-75] 68  Resp:  [15-18] 18  BP: (147-185)/(46-68) 156/46  MAP (mmHg):  [78-100] 78  SpO2:  [99 %-100 %] 100 %    Intake/Output last 24 hours:  I/O last 3 completed shifts:  In: 1235 [P.O.:1035; IV Piggyback:200]  Out: 1425 [Urine:1425]    Physical Exam:  General: no acute distress;  Central line in place  Neuro: A/O x 1  Pulm: Normal respiratory effort; equal chest rise   Cardiac: regular rate and rhythm   Abdomen: Soft, appropriately tender, distention improved. Incision C/D/I       Data Review:    I have reviewed the labs and studies from the last 24 hours.      Imaging: Radiology studies were personally reviewed

## 2022-10-18 NOTE — Unmapped (Signed)
VS stable. BG maintained. No complaints of n/v. All medications administered as ordered. Pt agitated beginning of shift. Pt pulling at cvad line; dressing changed. Tele sitter on. Pt placed on 1:1 with sitter for safety. Provider notified. Zyprexa given for agitation; see MAR. Labs drawn. No falls, bed is low and locked, call bell within reach, sitter at bedside. Plan of care updated. Handoff report given to RN.     Problem: Adult Inpatient Plan of Care  Goal: Plan of Care Review  Outcome: Progressing  Goal: Patient-Specific Goal (Individualized)  Outcome: Progressing  Goal: Absence of Hospital-Acquired Illness or Injury  Outcome: Progressing  Intervention: Identify and Manage Fall Risk  Recent Flowsheet Documentation  Taken 10/17/2022 2000 by Candelaria Celeste, RN  Safety Interventions:   bed alarm   fall reduction program maintained   low bed   lighting adjusted for tasks/safety   nonskid shoes/slippers when out of bed   sitter at bedside  Goal: Optimal Comfort and Wellbeing  Outcome: Progressing  Goal: Readiness for Transition of Care  Outcome: Progressing  Goal: Rounds/Family Conference  Outcome: Progressing     Problem: Infection  Goal: Absence of Infection Signs and Symptoms  Outcome: Progressing     Problem: Fall Injury Risk  Goal: Absence of Fall and Fall-Related Injury  Outcome: Progressing  Intervention: Promote Injury-Free Environment  Recent Flowsheet Documentation  Taken 10/17/2022 2000 by Candelaria Celeste, RN  Safety Interventions:   bed alarm   fall reduction program maintained   low bed   lighting adjusted for tasks/safety   nonskid shoes/slippers when out of bed   sitter at bedside     Problem: Self-Care Deficit  Goal: Improved Ability to Complete Activities of Daily Living  Outcome: Progressing     Problem: Comorbidity Management  Goal: Maintenance of Asthma Control  Outcome: Progressing  Goal: Maintenance of Behavioral Health Symptom Control  Outcome: Progressing  Goal: Maintenance of COPD Symptom Control  Outcome: Progressing  Goal: Blood Glucose Levels Within Targeted Range  Outcome: Progressing  Goal: Maintenance of Heart Failure Symptom Control  Outcome: Progressing  Goal: Blood Pressure in Desired Range  Outcome: Progressing  Goal: Maintenance of Osteoarthritis Symptom Control  Outcome: Progressing  Goal: Bariatric Home Regimen Maintained  Outcome: Progressing  Goal: Maintenance of Seizure Control  Outcome: Progressing     Problem: Skin Injury Risk Increased  Goal: Skin Health and Integrity  Outcome: Progressing     Problem: Wound  Goal: Optimal Coping  Outcome: Progressing  Goal: Optimal Functional Ability  Outcome: Progressing  Goal: Absence of Infection Signs and Symptoms  Outcome: Progressing  Goal: Improved Oral Intake  Outcome: Progressing  Goal: Optimal Pain Control and Function  Outcome: Progressing  Goal: Skin Health and Integrity  Outcome: Progressing  Goal: Optimal Wound Healing  Outcome: Progressing

## 2022-10-18 NOTE — Unmapped (Signed)
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 hospital course complicated with small bowel obstruction.    S/p DDKT 08/06/22, KDPI 56%  Mild non oliguric AKI resolved to baseline creatinine.    Immunosuppression [High risk medical decision making for drug therapy requiring intensive monitoring for toxicity]  - Transitioned SL tac 1.5 mg BID -> PO tac 3 mg BID on 3/26 afternoon. Will adjust as needed based on today's trough.    Goal trough is 7-9.   - Cont PO mycophenolate 500 mg BID  - Cont PO pred 5 mg daily     E.coli bacteremia from complicated urinary tract infection +/-transplant pyelonephritis  Repeat blood cultures 3/16 with no growth to date. Perinephric fluid aspiration 3/19 no growth to date  - Appreciate ICID input   - Meropenem 3/15 - TBD (min 2 week course)   - Recommend against PICC placement with recent transplant   - Plan outpatient referral to urogynecology for evaluation and mgmt recurrent urine tract infection    Small bowel obstruction  - S/p ex-lap and reduction on 10/13/22  - soft GI diet started on 3/24     Hypertension  Isolated systolic hypertension with wide pulse pressure and low diastolic readings make mgmt challenging without causing lightheadedness and increasing risk for falls, organ hypoperfusion. We have weaned off clonidine this admission.   - Cont Amlodipine 5 mg BID, coreg 25 mg BID, hydralazine 50 mg BID       Infectious prophylaxis and monitoring:   CMV D+/R+, EBV D+/R+  valganciclovir 450mg  daily x61m 1-4/24   trimethoprim/sulfamethoxazole 80/400mg  3x/week x54m 1-7/24  micafungin 150mg  daily vs oral +/-esophageal candidiasis    Florian Buff, MD  Division of Nephrology and Hypertension  [facility]  10/18/2022  _______________________________________________________________________________    Kidney Transplant History:   Date of Transplant: 08/06/2022 (Kidney)  KDPI: 56%  Cold ischemic time: 877 minutes (14 hr 37 min). Warm ischemic time: 32 minutes  cPRA: 0%  HLA match: Blood type: Donor A1, Recipient A POS  ID: CMV D+/R+, EBV D+/R+  Native Kidney Disease: Diabetes              Native kidney biopsy: Diabetic nephropathy (done elsewhere)              Pre-transplant dialysis course: PD started July 2017, transitioned to HD October 2019 due to catheter dysfunction/inadequate dialysis  Induction: Basiliximab with steroid maintenance    subjective: Pt doing well this morning. No acute events. Tolerating PO intake and meds    medications:   Current Facility-Administered Medications   Medication Dose Route Frequency    amlodipine (NORVASC) tablet 5 mg  5 mg Oral Q12H SCH    atorvastatin (LIPITOR) tablet 10 mg  10 mg Oral Q24H SCH    calcium carbonate (TUMS) chewable tablet 200 mg of elem calcium  200 mg of elem calcium Oral TID PRN    carvedilol (COREG) tablet 25 mg  25 mg Oral Q12H SCH    cloNIDine HCL (CATAPRES) tablet 0.1 mg  0.1 mg Oral Nightly    dextrose (D10W) 10% bolus 125 mL  12.5 g Intravenous Q10 Min PRN    glucagon injection 1 mg  1 mg Intramuscular Once PRN  glucose chewable tablet 16 g  16 g Oral Q10 Min PRN    guaiFENesin (ROBITUSSIN) oral syrup  200 mg Oral Q4H PRN    heparin (porcine) 5,000 unit/mL injection 5,000 Units  5,000 Units Subcutaneous Q8H SCH    hydrALAZINE (APRESOLINE) tablet 25 mg  25 mg Oral BID    HYDROmorphone (PF) injection Syrg 0.25 mg  0.25 mg Intravenous Q4H PRN    insulin glargine (LANTUS) injection 6 Units  6 Units Subcutaneous Nightly    insulin lispro (HumaLOG) injection 0-20 Units  0-20 Units Subcutaneous ACHS    labetalol (NORMODYNE) injection  20 mg Intravenous Q4H PRN    levothyroxine (SYNTHROID) tablet 112 mcg  112 mcg Oral daily    magnesium oxide-Mg AA chelate (Magnesium Plus Protein) 2 tablet  2 tablet Oral BID    melatonin tablet 3 mg  3 mg Oral Nightly PRN    meropenem (MERREM) 1 g in sodium chloride 0.9 % (NS) 100 mL IVPB-MBP  1 g Intravenous Q12H    methylPREDNISolone sodium succinate (PF) (SOLU-Medrol) injection 5.2 mg  5.2 mg Intravenous Q24H    micafungin (MYCAMINE) 150 mg in sodium chloride (NS) 0.9 % 100 mL IVPB  150 mg Intravenous Q24H    mycophenolate (CELLCEPT) 500 mg in dextrose 5 % 100 mL IVPB  500 mg Intravenous Q12H    ondansetron (ZOFRAN-ODT) disintegrating tablet 4 mg  4 mg Oral Q8H PRN    Or    ondansetron (ZOFRAN) injection 4 mg  4 mg Intravenous Q8H PRN    pantoprazole (Protonix) EC tablet 40 mg  40 mg Oral Daily    phenol (CHLORASEPTIC) 1.4 % spray 2 spray  2 spray Mucous Membrane Q2H PRN    prochlorperazine (COMPAZINE) injection 10 mg  10 mg Intramuscular Q8H PRN    simethicone (MYLICON) chewable tablet 80 mg  80 mg Oral Q6H PRN    sodium chloride (NS) 0.9 % infusion  75 mL/hr Intravenous Continuous    sulfamethoxazole-trimethoprim (BACTRIM) 400-80 mg tablet 80 mg of trimethoprim  1 tablet Oral Once per day on Monday Wednesday Friday    tacrolimus (PROGRAF) 1.5mg  combo product  1.5 mg Sublingual Daily    tacrolimus (PROGRAF) capsule 1 mg  1 mg Sublingual Nightly    valGANciclovir (VALCYTE) tablet 450 mg  450 mg Oral Every other day      ALLERGIES  Oxycodone, Gabapentin, and Valacyclovir    physical exam:  Vitals:    10/13/22 0744   BP: 158/52   Pulse: 75   Resp: 17   Temp: 97.7   SpO2: 97%     Intake/Output Summary (Last 24 hours) at 10/13/2022 1035  Last data filed at 10/13/2022 0400  Gross per 24 hour   Intake 0 ml   Output 1450 ml   Net -1450 ml     General: seated in chair no acute distress.  HEENT: anicteric sclera, MMM  CV: ext wwp   Lungs: nl work of breathing  Abdomen: soft, non-distended  Skin: no visible lesions or rashes  Neuro: alert and appropriately interactive     Test Results  Lab Results   Component Value Date    WBC 2.5 (L) 10/18/2022    HGB 8.5 (L) 10/18/2022    HCT 25.1 (L) 10/18/2022    PLT 123 (L) 10/18/2022     Lab Results   Component Value Date    NA 141 10/18/2022    K 5.0 (H) 10/18/2022    CL 112 (H) 10/18/2022  CO2 23.0 10/18/2022    BUN 23 10/18/2022    CREATININE 0.77 10/18/2022    GLU 103 10/18/2022    CALCIUM 7.8 (L) 10/18/2022    MG 1.5 (L) 10/18/2022    PHOS 1.5 (L) 10/18/2022      BILITOT 0.4 10/12/2022    PROT 5.5 (L) 10/12/2022    ALBUMIN 3.3 (L) 10/13/2022    ALT <7 (L) 10/12/2022    AST 19 10/12/2022    ALKPHOS 45 (L) 10/12/2022

## 2022-10-18 NOTE — Unmapped (Signed)
Tacrolimus Therapeutic Monitoring Pharmacy Note    Julie [name] is a 72 y.o. female continuing tacrolimus.     Indication: Kidney transplant     Date of Transplant:  08/06/22       Prior Dosing Information: Current regimen tacrolimus 3 mg PO BID     Home regimen: Prograf 2 mg BID    Source(s) of information used to determine prior to admission dosing: Home Medication List or Clinic Note    Goals:  Therapeutic Drug Levels  Tacrolimus trough goal:  7-9 ng/mL - goal confirmed with transplant nephrology attending 3/19    Additional Clinical Monitoring/Outcomes  Monitor renal function (SCr and urine output) and liver function (LFTs)  Monitor for signs/symptoms of adverse events (e.g., hyperglycemia, hyperkalemia, hypomagnesemia, hypertension, headache, tremor)    Results:   Tacrolimus level:  17.2 ng/mL, drawn appropriately ~9 hour level    Pharmacokinetic Considerations and Significant Drug Interactions:  Concurrent hepatotoxic medications: None identified  Concurrent CYP3A4 substrates/inhibitors: None identified  Concurrent nephrotoxic medications: None identified    Assessment/Plan:  Recommendedation(s)  HOLD tonight's dose and restart 2 mg PO BID tomorrow AM, as coordinated with transplant nephrology    Follow-up  Daily levels currently ordered .   A pharmacist will continue to monitor and recommend levels as appropriate    Please page service pharmacist with questions/clarifications.    Charlotte Crumb, PharmD

## 2022-10-19 DIAGNOSIS — Z940 Kidney transplant status: Principal | ICD-10-CM

## 2022-10-19 DIAGNOSIS — F09 Unspecified mental disorder due to known physiological condition: Principal | ICD-10-CM

## 2022-10-19 LAB — RENAL FUNCTION PANEL
ALBUMIN: 2.8 g/dL — ABNORMAL LOW (ref 3.4–5.0)
ANION GAP: 7 mmol/L (ref 5–14)
BLOOD UREA NITROGEN: 23 mg/dL (ref 9–23)
BUN / CREAT RATIO: 22
CALCIUM: 8.2 mg/dL — ABNORMAL LOW (ref 8.7–10.4)
CHLORIDE: 113 mmol/L — ABNORMAL HIGH (ref 98–107)
CO2: 22 mmol/L (ref 20.0–31.0)
CREATININE: 1.03 mg/dL — ABNORMAL HIGH
EGFR CKD-EPI (2021) FEMALE: 58 mL/min/{1.73_m2} — ABNORMAL LOW (ref >=60)
GLUCOSE RANDOM: 141 mg/dL (ref 70–179)
PHOSPHORUS: 1.8 mg/dL — ABNORMAL LOW (ref 2.4–5.1)
POTASSIUM: 5.4 mmol/L — ABNORMAL HIGH (ref 3.4–4.8)
SODIUM: 142 mmol/L (ref 135–145)

## 2022-10-19 LAB — CBC
HEMATOCRIT: 25.4 % — ABNORMAL LOW (ref 34.0–44.0)
HEMOGLOBIN: 8.6 g/dL — ABNORMAL LOW (ref 11.3–14.9)
MEAN CORPUSCULAR HEMOGLOBIN CONC: 33.7 g/dL (ref 32.0–36.0)
MEAN CORPUSCULAR HEMOGLOBIN: 32.3 pg (ref 25.9–32.4)
MEAN CORPUSCULAR VOLUME: 95.8 fL — ABNORMAL HIGH (ref 77.6–95.7)
MEAN PLATELET VOLUME: 7.8 fL (ref 6.8–10.7)
PLATELET COUNT: 141 10*9/L — ABNORMAL LOW (ref 150–450)
RED BLOOD CELL COUNT: 2.65 10*12/L — ABNORMAL LOW (ref 3.95–5.13)
RED CELL DISTRIBUTION WIDTH: 15.9 % — ABNORMAL HIGH (ref 12.2–15.2)
WBC ADJUSTED: 2.6 10*9/L — ABNORMAL LOW (ref 3.6–11.2)

## 2022-10-19 LAB — MAGNESIUM: MAGNESIUM: 1.6 mg/dL (ref 1.6–2.6)

## 2022-10-19 LAB — TACROLIMUS LEVEL, TROUGH: TACROLIMUS, TROUGH: 10.8 ng/mL (ref 5.0–15.0)

## 2022-10-19 MED ADMIN — melatonin tablet 6 mg: 6 mg | ORAL | @ 01:00:00

## 2022-10-19 MED ADMIN — methocarbamol (ROBAXIN) tablet 500 mg: 500 mg | ORAL | @ 01:00:00

## 2022-10-19 MED ADMIN — heparin (porcine) 5,000 unit/mL injection 5,000 Units: 5000 [IU] | SUBCUTANEOUS | @ 01:00:00

## 2022-10-19 MED ADMIN — amlodipine (NORVASC) tablet 5 mg: 5 mg | ORAL | @ 01:00:00

## 2022-10-19 MED ADMIN — carvedilol (COREG) tablet 25 mg: 25 mg | ORAL | @ 01:00:00

## 2022-10-19 MED ADMIN — meropenem (MERREM) 1 g in sodium chloride 0.9 % (NS) 100 mL IVPB-MBP: 1 g | INTRAVENOUS | @ 01:00:00 | Stop: 2022-10-23

## 2022-10-19 MED ADMIN — magnesium oxide-Mg AA chelate (Magnesium Plus Protein) 2 tablet: 2 | ORAL | @ 01:00:00

## 2022-10-19 MED ADMIN — mycophenolate (CELLCEPT) tablet 500 mg: 500 mg | ORAL | @ 01:00:00

## 2022-10-19 MED ADMIN — hydrALAZINE (APRESOLINE) tablet 50 mg: 50 mg | ORAL | @ 01:00:00

## 2022-10-19 MED ADMIN — insulin glargine (LANTUS) injection 6 Units: 6 [IU] | SUBCUTANEOUS | @ 01:00:00

## 2022-10-19 NOTE — Unmapped (Signed)
Tacrolimus Therapeutic Monitoring Pharmacy Note    Julie [name] is a 72 y.o. female continuing tacrolimus.     Indication: Kidney transplant     Date of Transplant:  08/06/22       Prior Dosing Information: Current regimen tacrolimus 2 mg PO BID     Home regimen: Prograf 2 mg BID    Source(s) of information used to determine prior to admission dosing: Home Medication List or Clinic Note    Goals:  Therapeutic Drug Levels  Tacrolimus trough goal:  7-9 ng/mL - goal confirmed with transplant nephrology attending 3/19    Additional Clinical Monitoring/Outcomes  Monitor renal function (SCr and urine output) and liver function (LFTs)  Monitor for signs/symptoms of adverse events (e.g., hyperglycemia, hyperkalemia, hypomagnesemia, hypertension, headache, tremor)    Results:   Tacrolimus level:  10.8 ng/mL, drawn appropriately     Pharmacokinetic Considerations and Significant Drug Interactions:  Concurrent hepatotoxic medications: None identified  Concurrent CYP3A4 substrates/inhibitors: None identified  Concurrent nephrotoxic medications: None identified    Assessment/Plan:  Recommendedation(s)  Continue current regimen of 2 mg BID decreased from yesterday.     Follow-up  Daily levels currently ordered .   A pharmacist will continue to monitor and recommend levels as appropriate    Please page service pharmacist with questions/clarifications.    Malachi Carl, PharmD

## 2022-10-19 NOTE — Unmapped (Cosign Needed)
General Surgery Progress Note      Assessment:   72 y.o. female with complicated previous medical history status post DCD DDKT in 07/2022, hypertension, type 2 diabetes, hypothyroidism, who was admitted for GNR bacteremia. General surgery consulted 3/17 as patient was found to have dilated loops of small bowel with multiple transition points in the RLE on CT, concerning for SBO. Continues to have high output from NGT in conjunction with failed GG challenge. Repeat CT AP 3/21 with persistent findings of high-grade SBO with worsening bowel distention. She is s/p exploratory laparotomy and reduction and small bowel volvulus on 10/13/22, transferred to [facility] on 10/14/22.     Plan:   *SBO   - s/p 3/22 ex lap w/ reduction of small bowel volvulus  - 3/24 s/p NGT pulled, having bowel movements. Regular diet   - Monitor for nausea/vomiting  - MMPC: IV tylenol, Robaxin; PRN: s/p IVHM, Oxy 2.5  - SSI  - ID following for Abx plan, originally admitted for GNR bactermia in setting of immunocompromised state (07/2022 DDKT on tac/mycophenolate)    - IV Meropenem, renally dosed 1g q8h 3/19 - 4/1  - No plans for tunneled catheter line at this point given short duration of antibiotics. Plan to discharge 4/1 after final antibiotic dose.   - Transplant nephrology following   - Mycophenolate 500mg  BID, tac 2mg  BID    Interval/Subjective:  NAEON, afebrile, VSS. Continues to be doing well with pain controlled. Patient remains irritated that she has to stay in the hospital but is amenable to staying until 4/1 for IV Abx.     Objective:     Vital signs in last 24 hours:  Temp:  [36.6 ??C (97.9 ??F)-37 ??C (98.6 ??F)] 36.9 ??C (98.4 ??F)  Heart Rate:  [69-80] 72  Resp:  [15-18] 15  BP: (143-157)/(40-79) 146/40  MAP (mmHg):  [70-80] 70  SpO2:  [99 %-100 %] 100 %    Intake/Output last 24 hours:  I/O last 3 completed shifts:  In: 1140 [P.O.:840; IV Piggyback:300]  Out: 0     Physical Exam:  General: No acute distress;  Central line in place  Neuro: A/O x 3  Pulm: Normal respiratory effort; equal chest rise   Cardiac: regular rate and rhythm   Abdomen: Soft, appropriately tender, distention improved. Incision with staples in place, dressings C/D/I       Data Review:    I have reviewed the labs and studies from the last 24 hours.      Imaging: Radiology studies were personally reviewed    Earnest Rosier, MD  PGY-1, General Surgery

## 2022-10-19 NOTE — Unmapped (Signed)
VS stable. BG maintained. No complaints of n/v. All medications administered as ordered. LIJ intact; labs drawn. Tele sitter on. No falls, bed is low and locked, call bell within reach, bed alarm on. Plan of care updated. Handoff report given to RN.     Problem: Adult Inpatient Plan of Care  Goal: Plan of Care Review  Outcome: Progressing  Goal: Patient-Specific Goal (Individualized)  Outcome: Progressing  Goal: Absence of Hospital-Acquired Illness or Injury  Outcome: Progressing  Intervention: Identify and Manage Fall Risk  Recent Flowsheet Documentation  Taken 10/18/2022 2045 by Candelaria Celeste, RN  Safety Interventions:   low bed   lighting adjusted for tasks/safety   fall reduction program maintained   nonskid shoes/slippers when out of bed   bed alarm  Goal: Optimal Comfort and Wellbeing  Outcome: Progressing  Goal: Readiness for Transition of Care  Outcome: Progressing  Goal: Rounds/Family Conference  Outcome: Progressing     Problem: Infection  Goal: Absence of Infection Signs and Symptoms  Outcome: Progressing     Problem: Fall Injury Risk  Goal: Absence of Fall and Fall-Related Injury  Outcome: Progressing  Intervention: Promote Injury-Free Environment  Recent Flowsheet Documentation  Taken 10/18/2022 2045 by Candelaria Celeste, RN  Safety Interventions:   low bed   lighting adjusted for tasks/safety   fall reduction program maintained   nonskid shoes/slippers when out of bed   bed alarm     Problem: Self-Care Deficit  Goal: Improved Ability to Complete Activities of Daily Living  Outcome: Progressing     Problem: Comorbidity Management  Goal: Maintenance of Asthma Control  Outcome: Progressing  Goal: Maintenance of Behavioral Health Symptom Control  Outcome: Progressing  Goal: Maintenance of COPD Symptom Control  Outcome: Progressing  Goal: Blood Glucose Levels Within Targeted Range  Outcome: Progressing  Goal: Maintenance of Heart Failure Symptom Control  Outcome: Progressing  Goal: Blood Pressure in Desired Range  Outcome: Progressing  Goal: Maintenance of Osteoarthritis Symptom Control  Outcome: Progressing  Goal: Bariatric Home Regimen Maintained  Outcome: Progressing  Goal: Maintenance of Seizure Control  Outcome: Progressing     Problem: Skin Injury Risk Increased  Goal: Skin Health and Integrity  Outcome: Progressing     Problem: Wound  Goal: Optimal Coping  Outcome: Progressing  Goal: Optimal Functional Ability  Outcome: Progressing  Goal: Absence of Infection Signs and Symptoms  Outcome: Progressing  Goal: Improved Oral Intake  Outcome: Progressing  Goal: Optimal Pain Control and Function  Outcome: Progressing  Goal: Skin Health and Integrity  Outcome: Progressing  Goal: Optimal Wound Healing  Outcome: Progressing
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
## 2022-10-19 NOTE — Unmapped (Signed)
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 hospital course complicated with small bowel obstruction.    S/p DDKT 08/06/22, KDPI 56%  Slight bump in Cr this morning to 1.05 from .77 due to supratherapeutic tacrolimus. Anticipate improvement with tacrolimus adjustments as below.     Immunosuppression [High risk medical decision making for drug therapy requiring intensive monitoring for toxicity]  - Transitioned SL tac 1.5 mg BID -> PO tac 3 mg BID on 3/26 afternoon. 3/27 10 hour level was elevated at 17 ng/ml. Held tacrolimus 3/27 evening and will resume today at 2 mg PO BID.   Goal trough is 7-9.   - Cont PO mycophenolate 500 mg BID  - Cont PO pred 5 mg daily     E.coli bacteremia from complicated urinary tract infection +/-transplant pyelonephritis  - Appreciate ICID input   - Meropenem 3/15 - TBD (min 2 week course)   - Recommend against PICC placement with recent transplant   - Plan outpatient referral to urogynecology for evaluation and mgmt recurrent urine tract infection    Small bowel obstruction  - S/p ex-lap and reduction on 10/13/22. Now doing well and tolerating PO.     Hypertension  Isolated systolic hypertension with wide pulse pressure and low diastolic readings make mgmt challenging without causing lightheadedness and increasing risk for falls, organ hypoperfusion. We have weaned off clonidine this admission.   - Cont Amlodipine 5 mg BID, coreg 25 mg BID, hydralazine 50 mg BID       Infectious prophylaxis and monitoring:   CMV D+/R+, EBV D+/R+  valganciclovir 450mg  daily x11m 1-4/24   trimethoprim/sulfamethoxazole 80/400mg  3x/week x86m 1-7/24  Fluconazole 400 mg daily +/-esophageal candidiasis    Florian Buff, MD  Division of Nephrology and Hypertension  [facility]  10/19/2022  _______________________________________________________________________________    Kidney Transplant History:   Date of Transplant: 08/06/2022 (Kidney)  KDPI: 56%  Cold ischemic time: 877 minutes (14 hr 37 min). Warm ischemic time: 32 minutes  cPRA: 0%  HLA match: Blood type: Donor A1, Recipient A POS  ID: CMV D+/R+, EBV D+/R+  Native Kidney Disease: Diabetes              Native kidney biopsy: Diabetic nephropathy (done elsewhere)              Pre-transplant dialysis course: PD started July 2017, transitioned to HD October 2019 due to catheter dysfunction/inadequate dialysis  Induction: Basiliximab with steroid maintenance    subjective: Pt doing well this morning. Husband at bedside.     medications:   Current Facility-Administered Medications   Medication Dose Route Frequency    amlodipine (NORVASC) tablet 5 mg  5 mg Oral Q12H SCH    atorvastatin (LIPITOR) tablet 10 mg  10 mg Oral Q24H SCH    calcium carbonate (TUMS) chewable tablet 200 mg of elem calcium  200 mg of elem calcium Oral TID PRN    carvedilol (COREG) tablet 25 mg  25 mg Oral Q12H SCH    cloNIDine HCL (CATAPRES) tablet 0.1 mg  0.1 mg Oral Nightly    dextrose (D10W) 10% bolus 125 mL  12.5 g Intravenous Q10 Min PRN    glucagon injection 1 mg  1 mg Intramuscular Once PRN  glucose chewable tablet 16 g  16 g Oral Q10 Min PRN    guaiFENesin (ROBITUSSIN) oral syrup  200 mg Oral Q4H PRN    heparin (porcine) 5,000 unit/mL injection 5,000 Units  5,000 Units Subcutaneous Q8H SCH    hydrALAZINE (APRESOLINE) tablet 25 mg  25 mg Oral BID    HYDROmorphone (PF) injection Syrg 0.25 mg  0.25 mg Intravenous Q4H PRN    insulin glargine (LANTUS) injection 6 Units  6 Units Subcutaneous Nightly    insulin lispro (HumaLOG) injection 0-20 Units  0-20 Units Subcutaneous ACHS    labetalol (NORMODYNE) injection  20 mg Intravenous Q4H PRN    levothyroxine (SYNTHROID) tablet 112 mcg  112 mcg Oral daily    magnesium oxide-Mg AA chelate (Magnesium Plus Protein) 2 tablet  2 tablet Oral BID    melatonin tablet 3 mg  3 mg Oral Nightly PRN    meropenem (MERREM) 1 g in sodium chloride 0.9 % (NS) 100 mL IVPB-MBP  1 g Intravenous Q12H    methylPREDNISolone sodium succinate (PF) (SOLU-Medrol) injection 5.2 mg  5.2 mg Intravenous Q24H    micafungin (MYCAMINE) 150 mg in sodium chloride (NS) 0.9 % 100 mL IVPB  150 mg Intravenous Q24H    mycophenolate (CELLCEPT) 500 mg in dextrose 5 % 100 mL IVPB  500 mg Intravenous Q12H    ondansetron (ZOFRAN-ODT) disintegrating tablet 4 mg  4 mg Oral Q8H PRN    Or    ondansetron (ZOFRAN) injection 4 mg  4 mg Intravenous Q8H PRN    pantoprazole (Protonix) EC tablet 40 mg  40 mg Oral Daily    phenol (CHLORASEPTIC) 1.4 % spray 2 spray  2 spray Mucous Membrane Q2H PRN    prochlorperazine (COMPAZINE) injection 10 mg  10 mg Intramuscular Q8H PRN    simethicone (MYLICON) chewable tablet 80 mg  80 mg Oral Q6H PRN    sodium chloride (NS) 0.9 % infusion  75 mL/hr Intravenous Continuous    sulfamethoxazole-trimethoprim (BACTRIM) 400-80 mg tablet 80 mg of trimethoprim  1 tablet Oral Once per day on Monday Wednesday Friday    tacrolimus (PROGRAF) 1.5mg  combo product  1.5 mg Sublingual Daily    tacrolimus (PROGRAF) capsule 1 mg  1 mg Sublingual Nightly    valGANciclovir (VALCYTE) tablet 450 mg  450 mg Oral Every other day      ALLERGIES  Oxycodone, Gabapentin, and Valacyclovir    physical exam:  Vitals:    10/13/22 0744   BP: 158/52   Pulse: 75   Resp: 17   Temp: 97.7   SpO2: 97%     Intake/Output Summary (Last 24 hours) at 10/13/2022 1035  Last data filed at 10/13/2022 0400  Gross per 24 hour   Intake 0 ml   Output 1450 ml   Net -1450 ml     General: well appearing, no acute distress  HEENT: anicteric sclera, MMM  CV: RRR, +systolic murmur. No edema. Extremities warm.   Lungs: nl work of breathing, clear anteriorly   Abdomen: soft, non-distended  Skin: no visible lesions or rashes  Neuro: alert and appropriately interactive     Test Results  Lab Results   Component Value Date    WBC 2.6 (L) 10/19/2022    HGB 8.6 (L) 10/19/2022    HCT 25.4 (L) 10/19/2022    PLT 141 (L) 10/19/2022     Lab Results   Component Value Date    NA 142 10/19/2022    K 5.4 (H) 10/19/2022  CL 113 (H) 10/19/2022    CO2 22.0 10/19/2022    BUN 23 10/19/2022    CREATININE 1.03 (H) 10/19/2022    GLU 141 10/19/2022    CALCIUM 8.2 (L) 10/19/2022    MG 1.6 10/19/2022    PHOS 1.8 (L) 10/19/2022      BILITOT 0.4 10/12/2022    PROT 5.5 (L) 10/12/2022    ALBUMIN 3.3 (L) 10/13/2022    ALT <7 (L) 10/12/2022    AST 19 10/12/2022    ALKPHOS 45 (L) 10/12/2022

## 2022-10-20 LAB — TACROLIMUS LEVEL, TROUGH: TACROLIMUS, TROUGH: 14.2 ng/mL (ref 5.0–15.0)

## 2022-10-20 LAB — CBC
HEMATOCRIT: 24 % — ABNORMAL LOW (ref 34.0–44.0)
HEMOGLOBIN: 8.4 g/dL — ABNORMAL LOW (ref 11.3–14.9)
MEAN CORPUSCULAR HEMOGLOBIN CONC: 35.2 g/dL (ref 32.0–36.0)
MEAN CORPUSCULAR HEMOGLOBIN: 33.8 pg — ABNORMAL HIGH (ref 25.9–32.4)
MEAN CORPUSCULAR VOLUME: 96.2 fL — ABNORMAL HIGH (ref 77.6–95.7)
MEAN PLATELET VOLUME: 8.1 fL (ref 6.8–10.7)
PLATELET COUNT: 135 10*9/L — ABNORMAL LOW (ref 150–450)
RED BLOOD CELL COUNT: 2.49 10*12/L — ABNORMAL LOW (ref 3.95–5.13)
RED CELL DISTRIBUTION WIDTH: 16 % — ABNORMAL HIGH (ref 12.2–15.2)
WBC ADJUSTED: 2.2 10*9/L — ABNORMAL LOW (ref 3.6–11.2)

## 2022-10-20 MED ADMIN — heparin (porcine) 5,000 unit/mL injection 5,000 Units: 5000 [IU] | SUBCUTANEOUS | @ 19:00:00

## 2022-10-20 MED ADMIN — meropenem (MERREM) 1 g in sodium chloride 0.9 % (NS) 100 mL IVPB-MBP: 1 g | INTRAVENOUS | @ 12:00:00 | Stop: 2022-10-23

## 2022-10-20 MED ADMIN — melatonin tablet 6 mg: 6 mg | ORAL | @ 22:00:00

## 2022-10-20 MED ADMIN — carvedilol (COREG) tablet 25 mg: 25 mg | ORAL | @ 12:00:00

## 2022-10-20 MED ADMIN — menthol (COUGH DROPS) lozenge 1 lozenge: 1 | ORAL | @ 05:00:00

## 2022-10-20 MED ADMIN — methocarbamol (ROBAXIN) tablet 500 mg: 500 mg | ORAL | @ 22:00:00

## 2022-10-20 MED ADMIN — tacrolimus (PROGRAF) capsule 2 mg: 2 mg | ORAL | @ 12:00:00 | Stop: 2022-10-20

## 2022-10-20 MED ADMIN — mycophenolate (CELLCEPT) tablet 500 mg: 500 mg | ORAL | @ 12:00:00

## 2022-10-20 MED ADMIN — methocarbamol (ROBAXIN) tablet 500 mg: 500 mg | ORAL | @ 10:00:00

## 2022-10-20 MED ADMIN — amlodipine (NORVASC) tablet 5 mg: 5 mg | ORAL | @ 12:00:00

## 2022-10-20 MED ADMIN — levothyroxine (SYNTHROID) tablet 112 mcg: 112 ug | ORAL | @ 10:00:00

## 2022-10-20 MED ADMIN — insulin lispro (HumaLOG) injection 0-20 Units: 0-20 [IU] | SUBCUTANEOUS | @ 12:00:00

## 2022-10-20 MED ADMIN — pantoprazole (Protonix) EC tablet 40 mg: 40 mg | ORAL | @ 12:00:00

## 2022-10-20 MED ADMIN — methocarbamol (ROBAXIN) tablet 500 mg: 500 mg | ORAL | @ 17:00:00

## 2022-10-20 MED ADMIN — insulin lispro (HumaLOG) injection 0-20 Units: 0-20 [IU] | SUBCUTANEOUS | @ 21:00:00

## 2022-10-20 MED ADMIN — magnesium oxide-Mg AA chelate (Magnesium Plus Protein) 2 tablet: 2 | ORAL | @ 12:00:00

## 2022-10-20 MED ADMIN — sulfamethoxazole-trimethoprim (BACTRIM) 400-80 mg tablet 80 mg of trimethoprim: 1 | ORAL | @ 12:00:00 | Stop: 2023-01-15

## 2022-10-20 MED ADMIN — oxyCODONE (ROXICODONE) immediate release tablet 2.5 mg: 2.5 mg | ORAL | @ 15:00:00 | Stop: 2022-10-20

## 2022-10-20 MED ADMIN — heparin (porcine) 5,000 unit/mL injection 5,000 Units: 5000 [IU] | SUBCUTANEOUS | @ 10:00:00

## 2022-10-20 MED ADMIN — fluconazole (DIFLUCAN) tablet 400 mg: 400 mg | ORAL | @ 12:00:00 | Stop: 2022-10-31

## 2022-10-20 MED ADMIN — hydrALAZINE (APRESOLINE) tablet 50 mg: 50 mg | ORAL | @ 12:00:00

## 2022-10-20 MED ADMIN — predniSONE (DELTASONE) tablet 5 mg: 5 mg | ORAL | @ 12:00:00

## 2022-10-20 MED ADMIN — atorvastatin (LIPITOR) tablet 10 mg: 10 mg | ORAL | @ 12:00:00

## 2022-10-20 NOTE — Unmapped (Signed)
A & O x 4. Pleasant & able to make her needs known. Voiding independently, adequate urine output, had a BM today. Pain is manage with PO Oxy, given x 1 this shift. On a regular diet, no N/V reported this shift. Continue on IV ABX. Her VS are WNL. Midline incision is intact. Tele sitter in place. Call bell within reach. Will continue to monitor.   Problem: Adult Inpatient Plan of Care  Goal: Plan of Care Review  Outcome: Progressing  Goal: Patient-Specific Goal (Individualized)  Outcome: Progressing  Goal: Absence of Hospital-Acquired Illness or Injury  Outcome: Progressing  Intervention: Identify and Manage Fall Risk  Recent Flowsheet Documentation  Taken 10/20/2022 0900 by Elio Forget, RN  Safety Interventions:   family at bedside   low bed   lighting adjusted for tasks/safety   fall reduction program maintained  Intervention: Prevent Skin Injury  Recent Flowsheet Documentation  Taken 10/20/2022 0900 by Elio Forget, RN  Positioning for Skin: Sitting in Chair  Goal: Optimal Comfort and Wellbeing  Outcome: Progressing  Goal: Readiness for Transition of Care  Outcome: Progressing  Goal: Rounds/Family Conference  Outcome: Progressing     Problem: Infection  Goal: Absence of Infection Signs and Symptoms  Outcome: Progressing     Problem: Fall Injury Risk  Goal: Absence of Fall and Fall-Related Injury  Outcome: Progressing  Intervention: Promote Injury-Free Environment  Recent Flowsheet Documentation  Taken 10/20/2022 0900 by Lyndon Code D, RN  Safety Interventions:   family at bedside   low bed   lighting adjusted for tasks/safety   fall reduction program maintained     Problem: Self-Care Deficit  Goal: Improved Ability to Complete Activities of Daily Living  Outcome: Progressing     Problem: Comorbidity Management  Goal: Maintenance of Asthma Control  Outcome: Progressing  Goal: Maintenance of Behavioral Health Symptom Control  Outcome: Progressing  Goal: Maintenance of COPD Symptom Control  Outcome: Progressing  Goal: Blood Glucose Levels Within Targeted Range  Outcome: Progressing  Goal: Maintenance of Heart Failure Symptom Control  Outcome: Progressing  Goal: Blood Pressure in Desired Range  Outcome: Progressing  Goal: Maintenance of Osteoarthritis Symptom Control  Outcome: Progressing  Goal: Bariatric Home Regimen Maintained  Outcome: Progressing  Goal: Maintenance of Seizure Control  Outcome: Progressing     Problem: Skin Injury Risk Increased  Goal: Skin Health and Integrity  Outcome: Progressing     Problem: Wound  Goal: Optimal Coping  Outcome: Progressing  Goal: Optimal Functional Ability  Outcome: Progressing  Goal: Absence of Infection Signs and Symptoms  Outcome: Progressing  Goal: Improved Oral Intake  Outcome: Progressing  Goal: Optimal Pain Control and Function  Outcome: Progressing  Goal: Skin Health and Integrity  Outcome: Progressing  Goal: Optimal Wound Healing  Outcome: Progressing     Problem: Malnutrition  Goal: Improved Nutritional Intake  Outcome: Progressing

## 2022-10-20 NOTE — Unmapped (Signed)
Pt has been alert and oriented, has been stable, denies pain during this shift. Pt is able to speak English to communicate needs. Surgical incision CDI, no signs of infection noted. will continue to monitor.    Problem: Adult Inpatient Plan of Care  Goal: Plan of Care Review  Outcome: Progressing  Goal: Patient-Specific Goal (Individualized)  Outcome: Progressing  Goal: Absence of Hospital-Acquired Illness or Injury  Outcome: Progressing  Intervention: Identify and Manage Fall Risk  Recent Flowsheet Documentation  Taken 10/19/2022 2100 by Farrel Demark, RN  Safety Interventions:   fall reduction program maintained   lighting adjusted for tasks/safety   low bed   sitter at bedside  Goal: Optimal Comfort and Wellbeing  Outcome: Progressing  Goal: Readiness for Transition of Care  Outcome: Progressing  Goal: Rounds/Family Conference  Outcome: Progressing     Problem: Infection  Goal: Absence of Infection Signs and Symptoms  Outcome: Progressing     Problem: Fall Injury Risk  Goal: Absence of Fall and Fall-Related Injury  Outcome: Progressing  Intervention: Promote Injury-Free Environment  Recent Flowsheet Documentation  Taken 10/19/2022 2100 by Farrel Demark, RN  Safety Interventions:   fall reduction program maintained   lighting adjusted for tasks/safety   low bed   sitter at bedside     Problem: Self-Care Deficit  Goal: Improved Ability to Complete Activities of Daily Living  Outcome: Progressing     Problem: Wound  Goal: Optimal Coping  Outcome: Progressing  Goal: Optimal Functional Ability  Outcome: Progressing  Goal: Absence of Infection Signs and Symptoms  Outcome: Progressing  Goal: Improved Oral Intake  Outcome: Progressing  Goal: Optimal Pain Control and Function  Outcome: Progressing  Goal: Skin Health and Integrity  Outcome: Progressing  Goal: Optimal Wound Healing  Outcome: Progressing

## 2022-10-20 NOTE — Unmapped (Signed)
Tacrolimus Therapeutic Monitoring Pharmacy Note    Julie [name] is a 72 y.o. female continuing tacrolimus.     Indication: Kidney transplant     Date of Transplant:  08/06/22       Prior Dosing Information: Current regimen tacrolimus 2 mg PO BID     Home regimen: Prograf 2 mg BID    Source(s) of information used to determine prior to admission dosing: Home Medication List or Clinic Note    Goals:  Therapeutic Drug Levels  Tacrolimus trough goal:  7-9 ng/mL - goal confirmed with transplant nephrology attending 3/19    Additional Clinical Monitoring/Outcomes  Monitor renal function (SCr and urine output) and liver function (LFTs)  Monitor for signs/symptoms of adverse events (e.g., hyperglycemia, hyperkalemia, hypomagnesemia, hypertension, headache, tremor)    Results:   Tacrolimus level:  14.2 ng/mL, drawn 8.75 hours post dose      Pharmacokinetic Considerations and Significant Drug Interactions:  Concurrent hepatotoxic medications: None identified  Concurrent CYP3A4 substrates/inhibitors: None identified  Concurrent nephrotoxic medications: None identified    Assessment/Plan:  Recommendedation(s)  Decrease to 1 mg PO BID per discuss with transplant nephrology    Follow-up  Daily levels currently ordered .   A pharmacist will continue to monitor and recommend levels as appropriate    Please page service pharmacist with questions/clarifications.    Charlotte Crumb, PharmD

## 2022-10-20 NOTE — Unmapped (Shared)
Nephrology ([facility]) History & Physical    Assessment & Plan:   Julie [name] is a 72 y.o. 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 She was admitted to [facility] on 3/14 for E. coli bacteremia from complicated urinary tract infection +/- transplant pyelonephritis. Her hospital course was complicated by SBO, s/p ex lap w/ reduction of small bowel volvulus. Patient presents to [facility] as a transfer for ongoing management.     Principal Problem:    E. coli UTI  Active Problems:    Type 2 diabetes mellitus with diabetic nephropathy, with long-term current use of insulin (CMS-HCC)    End-stage renal disease (CMS-HCC)    Hypothyroidism, acquired    Kidney transplant status, cadaveric  Resolved Problems:    * No resolved hospital problems. *    {The problem list should be reviewed and updated in Epic admission navigator to reflect active medical issues:75688:::1}    Active Problems    Small Bowel Obstruction  - S/p ex-lap and reduction on 10/13/22. Doing well and tolerating PO.     S/p DDKT 08/06/22, KDPI 56%  Slight bump in Cr yesterday to 1.03 from .77 due to supratherapeutic tacrolimus. Anticipate improvement with tacrolimus adjustments as below.     Immunosuppression   - Transitioned SL tac 1.5 mg BID -> PO tac 3 mg BID on 3/26 afternoon. 3/27 10 hour level was elevated at 17 ng/ml. Held tacrolimus 3/27 evening and resumed 3/28 at 2 mg PO BID.   Goal trough is 7-9.   - Cont PO mycophenolate 500 mg BID  - Cont PO pred 5 mg daily     E.coli bacteremia from complicated urinary tract infection +/-transplant pyelonephritis  - Appreciate ICID input   - Meropenem 3/15 - TBD (min 2 week course)   - Recommend against PICC placement with recent transplant   - Plan outpatient referral to urogynecology for evaluation and mgmt recurrent urine tract infection    Hypertension  Isolated systolic hypertension with wide pulse pressure and low diastolic readings make mgmt challenging without causing lightheadedness and increasing risk for falls, organ hypoperfusion. We have weaned off clonidine this admission.   - Cont Amlodipine 5 mg BID, coreg 25 mg BID, hydralazine 50 mg BID     Infectious prophylaxis and monitoring:   CMV D+/R+, EBV D+/R+  valganciclovir 450mg  daily x100m 1-4/24   trimethoprim/sulfamethoxazole 80/400mg  3x/week x2m 1-7/24  Fluconazole 400 mg daily +/-esophageal candidiasis    Chronic Problems    Hypothyroidism: Continue home Synthroid 112 mcg daily     T2DM  Last A1c 5.6. Home regimen Lantus 12 units nightly, lispro 3 units with meals, with sliding scale.   -Continue Lantus 6 units nightly  -SSI  -Holding mealtime insulin     {The patient's presentation is complicated by the following clinically significant conditions requiring additional evaluation and treatment :88492}   {After reviewing list move clinically significant conditions above in the assessment and plan then delete generated conditions before signing note:75688:::1}      Issues Impacting Complexity of Management:  {Issues Impacting Complexity of Management (Optional):96701}      {Medical Decision Making (Optional):97088}      Checklist:  Diet: {JLH DIET:49409::Regular Diet}  DVT PPx: {JLHDVTPPXNEW:65522}  Code Status: Full Code  Dispo: Patient appropriate for {Patient Status:93989}    Team Contact Information:   Primary Team: {JLH IM Services ZOX:09604}  Primary Resident: ***   Resident's Pager: {IMSERVICEPAGERS:73620}    Chief Concern:  E. coli UTI  {The principal admitting problem should be reviewed and updated in Epic admission navigator:75688:::1}    Subjective:   Julie [name] is a 72 y.o. female with pertinent PMHx of *** presenting with ***   {Name, Age, sex, pertinent aspects of PMHx and presenting complaints (what the patient says is the reason why he/she/they presented):75688:::1}      History obtained by ***.   {Specify reason why the patient was unable to provide history:75688:::1}    HPI:  ***      Pertinent Surgical Hx  ***    Pertinent Family Hx  ***    Pertinent Social Hx   ***    Allergies  Oxycodone, Gabapentin, and Valacyclovir    {Med Rec Confidence ZOXWRU:04540}  Prior to Admission medications    Medication Dose, Route, Frequency   acetaminophen (TYLENOL) 500 MG tablet 500-1,000 mg, Oral, Every 8 hours PRN   amlodipine (NORVASC) 10 MG tablet 10 mg, Oral, Daily (standard)   atorvastatin (LIPITOR) 10 MG tablet 10 mg, Oral, Daily (standard)   blood sugar diagnostic (ACCU-CHEK AVIVA PLUS TEST STRP) Strp USE 1 STRIP TO CHECK GLUCOSE THREE TIMES DAILY   blood sugar diagnostic (GLUCOSE BLOOD) Strp Use to check blood sugar as directed with insulin 3 times a day & for symptoms of high or low blood sugar.   blood sugar diagnostic Strp USE 1 STRIP TO CHECK GLUCOSE THREE TIMES DAILY   blood-glucose meter kit Use as directed. ACCU-CHEK AVIVA   blood-glucose meter kit Use as instructed   blood-glucose sensor (DEXCOM G7 SENSOR) Devi Use 1 sensor every 10 days.   calcium carbonate (TUMS) 200 mg calcium (500 mg) chewable tablet 400 mg of elem calcium, Oral, 3 times a day (standard)   carvedilol (COREG) 25 MG tablet 25 mg, Oral, 2 times a day (standard)   chlorthalidone (HYGROTON) 25 MG tablet 25 mg, Oral, Daily (standard)   cloNIDine (CATAPRES-TTS) 0.3 mg/24 hr 1 patch, Transdermal, Weekly   cloNIDine (CATAPRES-TTS) 0.3 mg/24 hr 1 patch, Transdermal, Weekly   docusate sodium (COLACE) 100 MG capsule 100 mg, Oral, 2 times a day PRN   hydrALAZINE (APRESOLINE) 100 MG tablet 100 mg, Oral, Every 8 hours   insulin aspart (NOVOLOG FLEXPEN U-100 INSULIN) 100 unit/mL (3 mL) injection pen Inject 0.03 mL (3 Units total) under the skin Three (3) times a day before meals AND 0-0.08 mL (0-8 Units total) Three (3) times a day before meals. Max 33 units per day.   insulin glargine (BASAGLAR, LANTUS) 100 unit/mL (3 mL) injection pen 5-10 Units, Subcutaneous, Nightly   lancets Misc Use to check blood sugar as directed with insulin 3 times a day & for symptoms of high or low blood sugar.   levothyroxine (SYNTHROID) 112 MCG tablet 112 mcg, Oral, Daily (standard)   magnesium oxide-Mg AA chelate (MAGNESIUM, AMINO ACID CHELATE,) 133 mg 1 tablet, Oral, 2 times a day, Hold until directed to take by nurse coordinator   mycophenolate (CELLCEPT) 250 mg capsule 500 mg, Oral, 2 times a day (standard)   pantoprazole (PROTONIX) 40 MG tablet 40 mg, Oral, Daily (standard)   pen needle, diabetic 32 gauge x 5/32 (4 mm) Ndle Use with insulin up to 4 times/day as needed.   pen needle, diabetic 32 gauge x 5/32 Ndle Use with insulin 2 times daily   polyethylene glycol (GLYCOLAX) 17 gram/dose powder 17 g, Oral, Daily PRN   predniSONE (DELTASONE) 5 MG tablet 5 mg, Oral, Daily   SENNA  8.6 mg tablet 1 tablet, Oral, Daily PRN   spironolactone (ALDACTONE) 25 MG tablet 25 mg, Oral, Daily (standard)   sulfamethoxazole-trimethoprim (BACTRIM) 400-80 mg per tablet 1 tablet, Oral, Every Mon-Wed-Fri   tacrolimus (PROGRAF) 1 MG capsule Take 2 capsules (2 mg total) by mouth daily AND 1 capsule (1 mg total) nightly.  Patient taking differently: Take 2 capsules (2 mg total) by mouth daily AND 2 capsule (1 mg total) nightly.   valGANciclovir (VALCYTE) 450 mg tablet 450 mg, Oral, Every other day   aspirin (ECOTRIN) 81 MG tablet 81 mg, Oral, Daily (standard)       Public relations account executive Maker:  Julie [name] currently {Blank single:19197::has,lacks} decisional capacity for healthcare decision-making and is {Blank single:19197::able,unable} to designate a surrogate healthcare decision maker. Julie [name] designated Educational psychologist) is/are ***NAME (the patient's {Blank single:19197::legal guardian,Healthcare Power of Attorney,Durable Power of Attorney,spouse,adult child,parent,adult sibling,other adult,care team physician(s)}) as denoted by {HCDM Authority:51785}.    Objective:   Physical Exam:  Temp:  [36.6 ??C (97.9 ??F)-37 ??C (98.6 ??F)] 36.7 ??C (98.1 ??F)  Heart Rate:  [68-82] 69  Resp:  [17-18] 18  BP: (142-177)/(47-57) 161/48  SpO2:  [100 %] 100 %    Gen: NAD, converses   Eyes: Sclera anicteric, EOMI grossly normal   HENT: Atraumatic, normocephalic  Neck: Trachea midline  Heart: RRR  Lungs: CTAB, no crackles or wheezes  Abdomen: Soft, NTND  Extremities: No edema  Neuro: Grossly symmetric, non-focal    Skin:  No rashes, lesions on clothed exam  Psych: Alert, oriented

## 2022-10-20 NOTE — Unmapped (Signed)
A & O x 4 with some forgetfulness. Pt is cooperative with care. Pain manage with PO Oxy, given x 1 this shift. Midline incision is intact. She is ambulating in the room independently, tele sitter in place. On a regular diet, no N/V reported this shift. Call bell within reach. Visited by her family today. Going to the bathroom independently. Will continue the plan of care.   Problem: Adult Inpatient Plan of Care  Goal: Plan of Care Review  Outcome: Progressing  Goal: Patient-Specific Goal (Individualized)  Outcome: Progressing  Goal: Absence of Hospital-Acquired Illness or Injury  Outcome: Progressing  Intervention: Identify and Manage Fall Risk  Recent Flowsheet Documentation  Taken 10/19/2022 0900 by Elio Forget, RN  Safety Interventions: (Tele sitter)   low bed   lighting adjusted for tasks/safety   fall reduction program maintained   other (comment)  Intervention: Prevent Skin Injury  Recent Flowsheet Documentation  Taken 10/19/2022 0900 by Elio Forget, RN  Positioning for Skin: Supine/Back  Skin Protection: incontinence pads utilized  Goal: Optimal Comfort and Wellbeing  Outcome: Progressing  Goal: Readiness for Transition of Care  Outcome: Progressing  Goal: Rounds/Family Conference  Outcome: Progressing     Problem: Infection  Goal: Absence of Infection Signs and Symptoms  Outcome: Progressing     Problem: Fall Injury Risk  Goal: Absence of Fall and Fall-Related Injury  Outcome: Progressing  Intervention: Promote Injury-Free Environment  Recent Flowsheet Documentation  Taken 10/19/2022 0900 by Elio Forget, RN  Safety Interventions: (Tele sitter)   low bed   lighting adjusted for tasks/safety   fall reduction program maintained   other (comment)     Problem: Self-Care Deficit  Goal: Improved Ability to Complete Activities of Daily Living  Outcome: Progressing     Problem: Comorbidity Management  Goal: Maintenance of Asthma Control  Outcome: Progressing  Goal: Maintenance of Behavioral Health Symptom Control  Outcome: Progressing  Goal: Maintenance of COPD Symptom Control  Outcome: Progressing  Goal: Blood Glucose Levels Within Targeted Range  Outcome: Progressing  Goal: Maintenance of Heart Failure Symptom Control  Outcome: Progressing  Goal: Blood Pressure in Desired Range  Outcome: Progressing  Goal: Maintenance of Osteoarthritis Symptom Control  Outcome: Progressing  Goal: Bariatric Home Regimen Maintained  Outcome: Progressing  Goal: Maintenance of Seizure Control  Outcome: Progressing     Problem: Wound  Goal: Optimal Coping  Outcome: Progressing  Goal: Optimal Functional Ability  Outcome: Progressing  Goal: Absence of Infection Signs and Symptoms  Outcome: Progressing  Goal: Improved Oral Intake  Outcome: Progressing  Goal: Optimal Pain Control and Function  Outcome: Progressing  Goal: Skin Health and Integrity  Outcome: Progressing  Intervention: Optimize Skin Protection  Recent Flowsheet Documentation  Taken 10/19/2022 0900 by Lyndon Code D, RN  Pressure Reduction Techniques: frequent weight shift encouraged  Pressure Reduction Devices: pressure-redistributing mattress utilized  Skin Protection: incontinence pads utilized  Goal: Optimal Wound Healing  Outcome: Progressing     Problem: Wound  Goal: Optimal Wound Healing  Outcome: Progressing

## 2022-10-20 NOTE — Unmapped (Addendum)
Assessment:   72 y.o. female with complicated previous medical history status post DCD DDKT in 07/2022, hypertension, type 2 diabetes, hypothyroidism, who was admitted for GNR bacteremia. General surgery consulted 3/17 as patient was found to have dilated loops of small bowel with multiple transition points in the RLE on CT, concerning for SBO. Continues to have high output from NGT in conjunction with failed GG challenge. Repeat CT AP 3/21 with persistent findings of high-grade SBO with worsening bowel distention. She is s/p exploratory laparotomy and reduction and small bowel volvulus on 10/13/22, transferred to [facility] on 10/14/22.     Notified by transplant case manager and DSS of concerns for home living situation.  Per DSS request, [facility] psych consult placed.  Discussed details of patient's care with nephrology team - Dr. Elvera Maria.  Patient is doing well from a surgical perspective, now POD 6 from exploratory laparotomy and reduction of small bowel volvulus on 10/13/2022.  Patient has recovered well from her surgery, is currently tolerating a regular diet without any nausea or vomiting.  Patient has no drains in place.  We will arrange for appropriate follow-up once discharged for wound inspection and staples removal.  Please page [id number] on day of discharge for follow-up appointment or for staple removal if patient has been inpatient for longer than 2 weeks from surgery (3/22).      No heavy lifting greater than 10 pounds for the first 2 weeks, and no heavy lifting greater than 15 pounds for the first 6 weeks until cleared to do so at your follow-up appointment with [place].

## 2022-10-20 NOTE — Unmapped (Signed)
General Surgery Progress Note      Assessment:   72 y.o. female with complicated previous medical history status post DCD DDKT in 07/2022, hypertension, type 2 diabetes, hypothyroidism, who was admitted for GNR bacteremia. General surgery consulted 3/17 as patient was found to have dilated loops of small bowel with multiple transition points in the RLE on CT, concerning for SBO. Continues to have high output from NGT in conjunction with failed GG challenge. Repeat CT AP 3/21 with persistent findings of high-grade SBO with worsening bowel distention. She is s/p exploratory laparotomy and reduction and small bowel volvulus on 10/13/22, transferred to [facility] on 10/14/22. Planning to transfer to transplant nephrology service today.     Plan:   *SBO   - s/p 3/22 ex lap w/ reduction of small bowel volvulus  - 3/24 s/p NGT pulled, having bowel movements. Regular diet   - Monitor for nausea/vomiting  - MMPC: IV tylenol, Robaxin; PRN: s/p IVHM, Oxy 2.5  - SSI  - ID following for Abx plan, originally admitted for GNR bactermia in setting of immunocompromised state (07/2022 DDKT on tac/mycophenolate)    - IV Meropenem, renally dosed 1g q8h 3/19 - 4/1  - No plans for tunneled catheter line at this point given short duration of antibiotics. Plan to discharge 4/1 after final antibiotic dose.   - Transplant nephrology following and planning on transferring patient to their service today   - Mycophenolate 500mg  BID, tac 2mg  BID  -Psychiatry consulted for capacity for patient to determine placement   - Further evaluation including an occupational therapy living skills assessment, MOCA testing in patient's primary language, and additional collateral will be necessary to determine patient's ability to care for herself at home     Interval/Subjective:  NAEON, afebrile, VSS. Continues to be doing well with pain controlled. Patient remains irritated that she has to stay in the hospital but is amenable to staying until 4/1 for IV Abx. Social work and transplant nephrology working to find this patient a safe disposition. Psychiatry evaluated yesterday    Objective:     Vital signs in last 24 hours:  Temp:  [36.6 ??C (97.9 ??F)-37 ??C (98.6 ??F)] 37 ??C (98.6 ??F)  Heart Rate:  [68-82] 68  Resp:  [15-18] 18  BP: (142-177)/(47-57) 157/47  MAP (mmHg):  [77-85] 77  SpO2:  [100 %] 100 %    Intake/Output last 24 hours:  I/O last 3 completed shifts:  In: 1130 [P.O.:930; IV Piggyback:200]  Out: 0     Physical Exam:  General: No acute distress;  Central line in place  Neuro: A/O x 3  Pulm: Normal respiratory effort; equal chest rise   Cardiac: regular rate and rhythm   Abdomen: Soft, appropriately tender, distention improved. Incision with staples in place, dressings C/D/I       Data Review:    I have reviewed the labs and studies from the last 24 hours.      Imaging: Radiology studies were personally reviewed

## 2022-10-21 LAB — CBC
HEMATOCRIT: 24.3 % — ABNORMAL LOW (ref 34.0–44.0)
HEMOGLOBIN: 8.3 g/dL — ABNORMAL LOW (ref 11.3–14.9)
MEAN CORPUSCULAR HEMOGLOBIN CONC: 34.1 g/dL (ref 32.0–36.0)
MEAN CORPUSCULAR HEMOGLOBIN: 32.9 pg — ABNORMAL HIGH (ref 25.9–32.4)
MEAN CORPUSCULAR VOLUME: 96.6 fL — ABNORMAL HIGH (ref 77.6–95.7)
MEAN PLATELET VOLUME: 8.2 fL (ref 6.8–10.7)
PLATELET COUNT: 126 10*9/L — ABNORMAL LOW (ref 150–450)
RED BLOOD CELL COUNT: 2.52 10*12/L — ABNORMAL LOW (ref 3.95–5.13)
RED CELL DISTRIBUTION WIDTH: 16.1 % — ABNORMAL HIGH (ref 12.2–15.2)
WBC ADJUSTED: 2.2 10*9/L — ABNORMAL LOW (ref 3.6–11.2)

## 2022-10-21 LAB — TACROLIMUS LEVEL, TROUGH: TACROLIMUS, TROUGH: 13.5 ng/mL (ref 5.0–15.0)

## 2022-10-21 LAB — PHOSPHORUS: PHOSPHORUS: 2 mg/dL — ABNORMAL LOW (ref 2.4–5.1)

## 2022-10-21 LAB — BASIC METABOLIC PANEL
ANION GAP: 7 mmol/L (ref 5–14)
BLOOD UREA NITROGEN: 19 mg/dL (ref 9–23)
BUN / CREAT RATIO: 19
CALCIUM: 8.5 mg/dL — ABNORMAL LOW (ref 8.7–10.4)
CHLORIDE: 110 mmol/L — ABNORMAL HIGH (ref 98–107)
CO2: 21 mmol/L (ref 20.0–31.0)
CREATININE: 1 mg/dL
EGFR CKD-EPI (2021) FEMALE: 60 mL/min/{1.73_m2} (ref >=60)
GLUCOSE RANDOM: 171 mg/dL — ABNORMAL HIGH (ref 70–99)
POTASSIUM: 6 mmol/L — ABNORMAL HIGH (ref 3.4–4.8)
SODIUM: 138 mmol/L (ref 135–145)

## 2022-10-21 LAB — MAGNESIUM: MAGNESIUM: 1.5 mg/dL — ABNORMAL LOW (ref 1.6–2.6)

## 2022-10-21 MED ADMIN — amlodipine (NORVASC) tablet 5 mg: 5 mg | ORAL | @ 01:00:00

## 2022-10-21 MED ADMIN — insulin lispro (HumaLOG) injection 0-20 Units: 0-20 [IU] | SUBCUTANEOUS | @ 22:00:00

## 2022-10-21 MED ADMIN — heparin (porcine) 5,000 unit/mL injection 5,000 Units: 5000 [IU] | SUBCUTANEOUS | @ 10:00:00

## 2022-10-21 MED ADMIN — methocarbamol (ROBAXIN) tablet 500 mg: 500 mg | ORAL | @ 01:00:00

## 2022-10-21 MED ADMIN — meropenem (MERREM) 1 g in sodium chloride 0.9 % (NS) 100 mL IVPB-MBP: 1 g | INTRAVENOUS | @ 01:00:00 | Stop: 2022-10-23

## 2022-10-21 MED ADMIN — methocarbamol (ROBAXIN) tablet 500 mg: 500 mg | ORAL | @ 21:00:00

## 2022-10-21 MED ADMIN — amlodipine (NORVASC) tablet 5 mg: 5 mg | ORAL | @ 13:00:00

## 2022-10-21 MED ADMIN — magnesium sulfate 2gm/50mL IVPB: 2 g | INTRAVENOUS | @ 21:00:00 | Stop: 2022-10-21

## 2022-10-21 MED ADMIN — insulin lispro (HumaLOG) injection 0-20 Units: 0-20 [IU] | SUBCUTANEOUS | @ 17:00:00

## 2022-10-21 MED ADMIN — sodium zirconium cyclosilicate (LOKELMA) packet 10 g: 10 g | ORAL | @ 19:00:00 | Stop: 2022-10-21

## 2022-10-21 MED ADMIN — pantoprazole (Protonix) EC tablet 40 mg: 40 mg | ORAL | @ 13:00:00

## 2022-10-21 MED ADMIN — carvedilol (COREG) tablet 25 mg: 25 mg | ORAL | @ 13:00:00

## 2022-10-21 MED ADMIN — heparin (porcine) 5,000 unit/mL injection 5,000 Units: 5000 [IU] | SUBCUTANEOUS | @ 17:00:00

## 2022-10-21 MED ADMIN — methocarbamol (ROBAXIN) tablet 500 mg: 500 mg | ORAL | @ 16:00:00

## 2022-10-21 MED ADMIN — carvedilol (COREG) tablet 25 mg: 25 mg | ORAL | @ 01:00:00

## 2022-10-21 MED ADMIN — magnesium oxide-Mg AA chelate (Magnesium Plus Protein) 2 tablet: 2 | ORAL | @ 01:00:00

## 2022-10-21 MED ADMIN — atorvastatin (LIPITOR) tablet 10 mg: 10 mg | ORAL | @ 13:00:00

## 2022-10-21 MED ADMIN — hydrALAZINE (APRESOLINE) tablet 50 mg: 50 mg | ORAL | @ 13:00:00

## 2022-10-21 MED ADMIN — methocarbamol (ROBAXIN) tablet 500 mg: 500 mg | ORAL | @ 10:00:00

## 2022-10-21 MED ADMIN — magnesium sulfate 2gm/50mL IVPB: 2 g | INTRAVENOUS | @ 19:00:00 | Stop: 2022-10-21

## 2022-10-21 MED ADMIN — magnesium oxide-Mg AA chelate (Magnesium Plus Protein) 2 tablet: 2 | ORAL | @ 13:00:00

## 2022-10-21 MED ADMIN — tacrolimus (PROGRAF) capsule 1 mg: 1 mg | ORAL | @ 13:00:00 | Stop: 2022-10-21

## 2022-10-21 MED ADMIN — fluconazole (DIFLUCAN) tablet 400 mg: 400 mg | ORAL | @ 13:00:00 | Stop: 2022-10-31

## 2022-10-21 MED ADMIN — tacrolimus (PROGRAF) capsule 1 mg: 1 mg | ORAL | @ 01:00:00

## 2022-10-21 MED ADMIN — heparin (porcine) 5,000 unit/mL injection 5,000 Units: 5000 [IU] | SUBCUTANEOUS | @ 01:00:00

## 2022-10-21 MED ADMIN — insulin glargine (LANTUS) injection 6 Units: 6 [IU] | SUBCUTANEOUS | @ 01:00:00

## 2022-10-21 MED ADMIN — predniSONE (DELTASONE) tablet 5 mg: 5 mg | ORAL | @ 13:00:00

## 2022-10-21 MED ADMIN — valGANciclovir (VALCYTE) tablet 450 mg: 450 mg | ORAL | @ 13:00:00 | Stop: 2022-11-05

## 2022-10-21 MED ADMIN — mycophenolate (CELLCEPT) tablet 500 mg: 500 mg | ORAL | @ 01:00:00

## 2022-10-21 MED ADMIN — levothyroxine (SYNTHROID) tablet 112 mcg: 112 ug | ORAL | @ 10:00:00

## 2022-10-21 MED ADMIN — meropenem (MERREM) 1 g in sodium chloride 0.9 % (NS) 100 mL IVPB-MBP: 1 g | INTRAVENOUS | @ 13:00:00 | Stop: 2022-10-23

## 2022-10-21 MED ADMIN — hydrALAZINE (APRESOLINE) tablet 50 mg: 50 mg | ORAL | @ 01:00:00

## 2022-10-21 MED ADMIN — mycophenolate (CELLCEPT) tablet 500 mg: 500 mg | ORAL | @ 13:00:00

## 2022-10-21 NOTE — Unmapped (Signed)
Pt has been stable, VSS, denies pain during this shift. No falls reported, ambulates independently with steady gait. PNA was discontinued during the day but telesitter monitoring remain in place. Pt tolerating meal, no nausea reported, blood sugar check with a SSI. Will continue to monitor.    Problem: Adult Inpatient Plan of Care  Goal: Plan of Care Review  Outcome: Progressing  Goal: Patient-Specific Goal (Individualized)  Outcome: Progressing  Goal: Absence of Hospital-Acquired Illness or Injury  Outcome: Progressing  Intervention: Identify and Manage Fall Risk  Recent Flowsheet Documentation  Taken 10/20/2022 2100 by Farrel Demark, RN  Safety Interventions:   fall reduction program maintained   lighting adjusted for tasks/safety   low bed   nonskid shoes/slippers when out of bed  Goal: Optimal Comfort and Wellbeing  Outcome: Progressing  Goal: Readiness for Transition of Care  Outcome: Progressing  Goal: Rounds/Family Conference  Outcome: Progressing     Problem: Fall Injury Risk  Goal: Absence of Fall and Fall-Related Injury  Outcome: Progressing  Intervention: Promote Scientist, clinical (histocompatibility and immunogenetics) Documentation  Taken 10/20/2022 2100 by Farrel Demark, RN  Safety Interventions:   fall reduction program maintained   lighting adjusted for tasks/safety   low bed   nonskid shoes/slippers when out of bed     Problem: Infection  Goal: Absence of Infection Signs and Symptoms  Outcome: Progressing     Problem: Wound  Goal: Optimal Coping  Outcome: Progressing  Goal: Optimal Functional Ability  Outcome: Progressing  Goal: Absence of Infection Signs and Symptoms  Outcome: Progressing  Goal: Improved Oral Intake  Outcome: Progressing  Goal: Optimal Pain Control and Function  Outcome: Progressing  Goal: Skin Health and Integrity  Outcome: Progressing  Goal: Optimal Wound Healing  Outcome: Progressing     Problem: Malnutrition  Goal: Improved Nutritional Intake  Outcome: Progressing

## 2022-10-21 NOTE — Unmapped (Signed)
Problem: Adult Inpatient Plan of Care  Goal: Plan of Care Review  Outcome: Ongoing - Unchanged  Flowsheets (Taken 10/21/2022 1019)  Progress: no change  Plan of Care Reviewed With: patient  Goal: Patient-Specific Goal (Individualized)  Outcome: Ongoing - Unchanged  Goal: Absence of Hospital-Acquired Illness or Injury  Outcome: Ongoing - Unchanged  Goal: Optimal Comfort and Wellbeing  Outcome: Ongoing - Unchanged  Goal: Readiness for Transition of Care  Outcome: Ongoing - Unchanged  Goal: Rounds/Family Conference  Outcome: Ongoing - Unchanged     Problem: Infection  Goal: Absence of Infection Signs and Symptoms  Outcome: Ongoing - Unchanged     Problem: Fall Injury Risk  Goal: Absence of Fall and Fall-Related Injury  Outcome: Ongoing - Unchanged     Problem: Self-Care Deficit  Goal: Improved Ability to Complete Activities of Daily Living  Outcome: Ongoing - Unchanged     Problem: Comorbidity Management  Goal: Maintenance of Asthma Control  Outcome: Ongoing - Unchanged  Goal: Maintenance of Behavioral Health Symptom Control  Outcome: Ongoing - Unchanged  Goal: Maintenance of COPD Symptom Control  Outcome: Ongoing - Unchanged  Goal: Blood Glucose Levels Within Targeted Range  Outcome: Ongoing - Unchanged  Goal: Maintenance of Heart Failure Symptom Control  Outcome: Ongoing - Unchanged  Goal: Blood Pressure in Desired Range  Outcome: Ongoing - Unchanged  Goal: Maintenance of Osteoarthritis Symptom Control  Outcome: Ongoing - Unchanged  Goal: Bariatric Home Regimen Maintained  Outcome: Ongoing - Unchanged  Goal: Maintenance of Seizure Control  Outcome: Ongoing - Unchanged     Problem: Skin Injury Risk Increased  Goal: Skin Health and Integrity  Outcome: Ongoing - Unchanged     Problem: Wound  Goal: Optimal Coping  Outcome: Ongoing - Unchanged  Goal: Optimal Functional Ability  Outcome: Ongoing - Unchanged  Goal: Absence of Infection Signs and Symptoms  Outcome: Ongoing - Unchanged  Goal: Improved Oral Intake  Outcome: Ongoing - Unchanged  Goal: Optimal Pain Control and Function  Outcome: Ongoing - Unchanged  Goal: Skin Health and Integrity  Outcome: Ongoing - Unchanged  Goal: Optimal Wound Healing  Outcome: Ongoing - Unchanged     Problem: Malnutrition  Goal: Improved Nutritional Intake  Outcome: Ongoing - Unchanged

## 2022-10-21 NOTE — Unmapped (Signed)
Nephrology ([facility]) Progress Note    Assessment & Plan:   Julie [name] is a 72 y.o. female with complicated previous medical history status post DCD DDKT in 07/2022, hypertension, type 2 diabetes, hypothyroidism, who was admitted for GNR bacteremia. She is s/p exploratory laparotomy and reduction and small bowel volvulus on 10/13/22. On IV abx until 4/1. Transferred to [facility] for tacrolimus dosing and dispo.    Principal Problem:    E. coli UTI  Active Problems:    Type 2 diabetes mellitus with diabetic nephropathy, with long-term current use of insulin (CMS-HCC)    End-stage renal disease (CMS-HCC)    Hypothyroidism, acquired    Kidney transplant status, cadaveric  Resolved Problems:    * No resolved hospital problems. *    ESBL/FQ-R E. coli bacteremia and UTI 10/04/22   Presented with sepsis, blood and urine cultures grew MDR E Coli. Imaging showed perinephric collection new from prior (she had prior JP drain). Underwent IR guided fluid collection. Has RIJ non-tunneled line. Narrowed to mero. Doing well, remains afebrile. Continuing with IV abx course.  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 Had slight bump 1.03 from 0.77 due to supratherapeutic tacrolimus, 1.00 this morning.   - Adjust tac per pharmacy, has been intermittently held, currently on 1mg  BID with plans to restart tomorrow  - Cellcept 500mg  BID  - Pred 5 daily  - Prophylaxis: bactrim and valcyte daily  - Daily BMP      Hypertension  Isolated systolic hypertension with wide pulse pressure and low diastolic readings make mgmt challenging without causing lightheadedness and increasing risk for falls, organ hypoperfusion. We have weaned off clonidine this admission.   - Continue Amlodipine, coreg, hydral     SBO (resolved)  She is s/p exploratory laparotomy and reduction and small bowel volvulus on 10/13/22. NGT removed 3/24, having BM and normal PO.     T2DM  -Lantus 6u nightly  -SSI      Daily Checklist:  Diet: Regular Diet  DVT PPx: Heparin 5000units q8h  Electrolytes:  Repletion as needed  Code Status: Full Code  Dispo:  Continue floor status care    Team Contact Information:   Primary Team: Nephrology ([facility])  Primary Resident: Colonel Bald, MD, MD  Resident's Pager: [phone] (Geriatrics Intern - [facility])    Interval History:   No acute events overnight. Patient feeling well this morning. Denies abdominal pain or other symptoms.    All other systems were reviewed and are negative except as noted in the HPI    Objective:   Temp:  [36.6 ??C (97.9 ??F)-37 ??C (98.6 ??F)] 36.6 ??C (97.9 ??F)  Heart Rate:  [65-69] 66  Resp:  [17-18] 17  BP: (140-164)/(42-49) 164/47  SpO2:  [99 %-100 %] 100 %    Gen: Sitting in chair comfortably in NAD, answers questions appropriately  Eyes: sclera anicteric, EOMI  HENT: atraumatic, MMM  Heart: RRR, S1, S2, no murmur  Lungs: CTAB, no crackles or wheezes, no use of accessory muscles  Abdomen: Normoactive bowel sounds, soft, NTND, no rebound/guarding, midline abdominal scar with staples present is clean and dry without erythema, edema, or tenderness  Extremities: no clubbing, cyanosis, or edema in the BLEs  Psych: Alert, oriented, appropriate mood and  affect    Labs/Studies: Labs and Studies from the last 24hrs per EMR and Reviewed

## 2022-10-21 NOTE — Unmapped (Incomplete Revision)
Patient ready to transfer to [facility]. Report given to RN @1630 . Patient will transport via wheelchair. Belongings packed and sent with patient.   Problem: Adult Inpatient Plan of Care  Goal: Plan of Care Review  Outcome: Ongoing - Unchanged  Flowsheets (Taken 10/21/2022 1019)  Progress: no change  Plan of Care Reviewed With: patient  Goal: Patient-Specific Goal (Individualized)  Outcome: Ongoing - Unchanged  Goal: Absence of Hospital-Acquired Illness or Injury  Outcome: Ongoing - Unchanged  Goal: Optimal Comfort and Wellbeing  Outcome: Ongoing - Unchanged  Goal: Readiness for Transition of Care  Outcome: Ongoing - Unchanged  Goal: Rounds/Family Conference  Outcome: Ongoing - Unchanged     Problem: Infection  Goal: Absence of Infection Signs and Symptoms  Outcome: Ongoing - Unchanged     Problem: Fall Injury Risk  Goal: Absence of Fall and Fall-Related Injury  Outcome: Ongoing - Unchanged     Problem: Self-Care Deficit  Goal: Improved Ability to Complete Activities of Daily Living  Outcome: Ongoing - Unchanged     Problem: Comorbidity Management  Goal: Maintenance of Asthma Control  Outcome: Ongoing - Unchanged  Goal: Maintenance of Behavioral Health Symptom Control  Outcome: Ongoing - Unchanged  Goal: Maintenance of COPD Symptom Control  Outcome: Ongoing - Unchanged  Goal: Blood Glucose Levels Within Targeted Range  Outcome: Ongoing - Unchanged  Goal: Maintenance of Heart Failure Symptom Control  Outcome: Ongoing - Unchanged  Goal: Blood Pressure in Desired Range  Outcome: Ongoing - Unchanged  Goal: Maintenance of Osteoarthritis Symptom Control  Outcome: Ongoing - Unchanged  Goal: Bariatric Home Regimen Maintained  Outcome: Ongoing - Unchanged  Goal: Maintenance of Seizure Control  Outcome: Ongoing - Unchanged     Problem: Skin Injury Risk Increased  Goal: Skin Health and Integrity  Outcome: Ongoing - Unchanged     Problem: Wound  Goal: Optimal Coping  Outcome: Ongoing - Unchanged  Goal: Optimal Functional Ability  Outcome: Ongoing - Unchanged  Goal: Absence of Infection Signs and Symptoms  Outcome: Ongoing - Unchanged  Goal: Improved Oral Intake  Outcome: Ongoing - Unchanged  Goal: Optimal Pain Control and Function  Outcome: Ongoing - Unchanged  Goal: Skin Health and Integrity  Outcome: Ongoing - Unchanged  Goal: Optimal Wound Healing  Outcome: Ongoing - Unchanged     Problem: Malnutrition  Goal: Improved Nutritional Intake  Outcome: Ongoing - Unchanged

## 2022-10-21 NOTE — Unmapped (Signed)
Tacrolimus Therapeutic Monitoring Pharmacy Note    Julie [name] is a 72 y.o. female continuing tacrolimus.     Indication: Kidney transplant     Date of Transplant:  08/06/22       Prior Dosing Information: Current regimen tacrolimus 1 mg PO BID     Home regimen: Prograf 2 mg BID    Source(s) of information used to determine prior to admission dosing: Home Medication List or Clinic Note    Goals:  Therapeutic Drug Levels  Tacrolimus trough goal:  7-9 ng/mL - goal confirmed with transplant nephrology attending 3/19    Additional Clinical Monitoring/Outcomes  Monitor renal function (SCr and urine output) and liver function (LFTs)  Monitor for signs/symptoms of adverse events (e.g., hyperglycemia, hyperkalemia, hypomagnesemia, hypertension, headache, tremor)    Results:   Tacrolimus level:  13.5 ng/mL, drawn ~2.5 hours early      Pharmacokinetic Considerations and Significant Drug Interactions:  Concurrent hepatotoxic medications: None identified  Concurrent CYP3A4 substrates/inhibitors: None identified  Concurrent nephrotoxic medications: None identified    Assessment/Plan:  Recommendedation(s)  Hold dose of tac tonight and restart tomorrow at 1 mg BID    Follow-up  Daily levels currently ordered .   A pharmacist will continue to monitor and recommend levels as appropriate    Please page service pharmacist with questions/clarifications.    [place] M Quamir Willemsen, PharmD
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
## 2022-10-22 LAB — CBC
HEMATOCRIT: 24.3 % — ABNORMAL LOW (ref 34.0–44.0)
HEMOGLOBIN: 8.1 g/dL — ABNORMAL LOW (ref 11.3–14.9)
MEAN CORPUSCULAR HEMOGLOBIN CONC: 33.5 g/dL (ref 32.0–36.0)
MEAN CORPUSCULAR HEMOGLOBIN: 32.5 pg — ABNORMAL HIGH (ref 25.9–32.4)
MEAN CORPUSCULAR VOLUME: 97.2 fL — ABNORMAL HIGH (ref 77.6–95.7)
MEAN PLATELET VOLUME: 8.1 fL (ref 6.8–10.7)
PLATELET COUNT: 124 10*9/L — ABNORMAL LOW (ref 150–450)
RED BLOOD CELL COUNT: 2.5 10*12/L — ABNORMAL LOW (ref 3.95–5.13)
RED CELL DISTRIBUTION WIDTH: 16.4 % — ABNORMAL HIGH (ref 12.2–15.2)
WBC ADJUSTED: 2 10*9/L — ABNORMAL LOW (ref 3.6–11.2)

## 2022-10-22 LAB — RENAL FUNCTION PANEL
ALBUMIN: 2.9 g/dL — ABNORMAL LOW (ref 3.4–5.0)
ANION GAP: 6 mmol/L (ref 5–14)
BLOOD UREA NITROGEN: 18 mg/dL (ref 9–23)
BUN / CREAT RATIO: 19
CALCIUM: 8.7 mg/dL (ref 8.7–10.4)
CHLORIDE: 112 mmol/L — ABNORMAL HIGH (ref 98–107)
CO2: 22 mmol/L (ref 20.0–31.0)
CREATININE: 0.93 mg/dL
EGFR CKD-EPI (2021) FEMALE: 65 mL/min/{1.73_m2} (ref >=60)
GLUCOSE RANDOM: 101 mg/dL (ref 70–179)
PHOSPHORUS: 2.1 mg/dL — ABNORMAL LOW (ref 2.4–5.1)
POTASSIUM: 5.1 mmol/L — ABNORMAL HIGH (ref 3.4–4.8)
SODIUM: 140 mmol/L (ref 135–145)

## 2022-10-22 LAB — MAGNESIUM: MAGNESIUM: 2.5 mg/dL (ref 1.6–2.6)

## 2022-10-22 MED ADMIN — insulin glargine (LANTUS) injection 6 Units: 6 [IU] | SUBCUTANEOUS | @ 03:00:00

## 2022-10-22 MED ADMIN — predniSONE (DELTASONE) tablet 5 mg: 5 mg | ORAL | @ 12:00:00

## 2022-10-22 MED ADMIN — insulin lispro (HumaLOG) injection 0-20 Units: 0-20 [IU] | SUBCUTANEOUS | @ 21:00:00

## 2022-10-22 MED ADMIN — atorvastatin (LIPITOR) tablet 10 mg: 10 mg | ORAL | @ 12:00:00

## 2022-10-22 MED ADMIN — carvedilol (COREG) tablet 25 mg: 25 mg | ORAL | @ 01:00:00

## 2022-10-22 MED ADMIN — polyethylene glycol (MIRALAX) packet 17 g: 17 g | ORAL | @ 23:00:00

## 2022-10-22 MED ADMIN — methocarbamol (ROBAXIN) tablet 500 mg: 500 mg | ORAL | @ 10:00:00

## 2022-10-22 MED ADMIN — mycophenolate (CELLCEPT) tablet 500 mg: 500 mg | ORAL | @ 01:00:00

## 2022-10-22 MED ADMIN — hydrALAZINE (APRESOLINE) tablet 50 mg: 50 mg | ORAL | @ 01:00:00

## 2022-10-22 MED ADMIN — methocarbamol (ROBAXIN) tablet 500 mg: 500 mg | ORAL | @ 01:00:00

## 2022-10-22 MED ADMIN — pantoprazole (Protonix) EC tablet 40 mg: 40 mg | ORAL | @ 12:00:00

## 2022-10-22 MED ADMIN — heparin (porcine) 5,000 unit/mL injection 5,000 Units: 5000 [IU] | SUBCUTANEOUS | @ 10:00:00

## 2022-10-22 MED ADMIN — sodium zirconium cyclosilicate (LOKELMA) packet 10 g: 10 g | ORAL | @ 03:00:00 | Stop: 2022-10-21

## 2022-10-22 MED ADMIN — mycophenolate (CELLCEPT) tablet 500 mg: 500 mg | ORAL | @ 12:00:00

## 2022-10-22 MED ADMIN — carvedilol (COREG) tablet 25 mg: 25 mg | ORAL | @ 12:00:00

## 2022-10-22 MED ADMIN — magnesium oxide-Mg AA chelate (Magnesium Plus Protein) 2 tablet: 2 | ORAL | @ 01:00:00

## 2022-10-22 MED ADMIN — meropenem (MERREM) 1 g in sodium chloride 0.9 % (NS) 100 mL IVPB-MBP: 1 g | INTRAVENOUS | @ 13:00:00 | Stop: 2022-10-23

## 2022-10-22 MED ADMIN — tacrolimus (PROGRAF) capsule 1 mg: 1 mg | ORAL | @ 12:00:00

## 2022-10-22 MED ADMIN — hydrALAZINE (APRESOLINE) tablet 50 mg: 50 mg | ORAL | @ 12:00:00

## 2022-10-22 MED ADMIN — meropenem (MERREM) 1 g in sodium chloride 0.9 % (NS) 100 mL IVPB-MBP: 1 g | INTRAVENOUS | @ 01:00:00 | Stop: 2022-10-23

## 2022-10-22 MED ADMIN — melatonin tablet 6 mg: 6 mg | ORAL | @ 01:00:00

## 2022-10-22 MED ADMIN — levothyroxine (SYNTHROID) tablet 112 mcg: 112 ug | ORAL | @ 10:00:00

## 2022-10-22 MED ADMIN — amlodipine (NORVASC) tablet 5 mg: 5 mg | ORAL | @ 01:00:00

## 2022-10-22 MED ADMIN — magnesium oxide-Mg AA chelate (Magnesium Plus Protein) 2 tablet: 2 | ORAL | @ 12:00:00

## 2022-10-22 MED ADMIN — methocarbamol (ROBAXIN) tablet 500 mg: 500 mg | ORAL | @ 21:00:00

## 2022-10-22 MED ADMIN — heparin (porcine) 5,000 unit/mL injection 5,000 Units: 5000 [IU] | SUBCUTANEOUS | @ 02:00:00

## 2022-10-22 MED ADMIN — heparin (porcine) 5,000 unit/mL injection 5,000 Units: 5000 [IU] | SUBCUTANEOUS | @ 17:00:00

## 2022-10-22 MED ADMIN — fluconazole (DIFLUCAN) tablet 400 mg: 400 mg | ORAL | @ 12:00:00 | Stop: 2022-10-31

## 2022-10-22 MED ADMIN — amlodipine (NORVASC) tablet 5 mg: 5 mg | ORAL | @ 12:00:00

## 2022-10-22 MED ADMIN — methocarbamol (ROBAXIN) tablet 500 mg: 500 mg | ORAL | @ 17:00:00
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
## 2022-10-22 NOTE — Unmapped (Signed)
Patient is alert and oriented and able to make her needs known. Blood sugar monitoring ACHS and coverage as order. IV antibiotics infused this shift. No CO pain and discomfort this shift. Call bell and tray table within reach, will continue to monitor her closely.      Problem: Adult Inpatient Plan of Care  Goal: Plan of Care Review  Outcome: Progressing  Flowsheets (Taken 10/22/2022 1630)  Progress: improving  Plan of Care Reviewed With: patient  Goal: Patient-Specific Goal (Individualized)  Outcome: Progressing  Goal: Absence of Hospital-Acquired Illness or Injury  Outcome: Progressing  Intervention: Identify and Manage Fall Risk  Recent Flowsheet Documentation  Taken 10/22/2022 0800 by Milinda Pointer, RN  Safety Interventions:   fall reduction program maintained   low bed   lighting adjusted for tasks/safety   nonskid shoes/slippers when out of bed  Intervention: Prevent Skin Injury  Recent Flowsheet Documentation  Taken 10/22/2022 0800 by Milinda Pointer, RN  Positioning for Skin: Sitting in Chair  Intervention: Prevent and Manage VTE (Venous Thromboembolism) Risk  Recent Flowsheet Documentation  Taken 10/22/2022 0800 by Milinda Pointer, RN  VTE Prevention/Management: anticoagulant therapy  Goal: Optimal Comfort and Wellbeing  Outcome: Progressing  Goal: Readiness for Transition of Care  Outcome: Progressing  Goal: Rounds/Family Conference  Outcome: Progressing     Problem: Infection  Goal: Absence of Infection Signs and Symptoms  Outcome: Progressing     Problem: Fall Injury Risk  Goal: Absence of Fall and Fall-Related Injury  Outcome: Progressing  Intervention: Promote Injury-Free Environment  Recent Flowsheet Documentation  Taken 10/22/2022 0800 by Milinda Pointer, RN  Safety Interventions:   fall reduction program maintained   low bed   lighting adjusted for tasks/safety   nonskid shoes/slippers when out of bed     Problem: Self-Care Deficit  Goal: Improved Ability to Complete Activities of Daily Living  Outcome: Progressing     Problem: Comorbidity Management  Goal: Maintenance of Asthma Control  Outcome: Progressing  Goal: Maintenance of Behavioral Health Symptom Control  Outcome: Progressing  Goal: Maintenance of COPD Symptom Control  Outcome: Progressing  Goal: Blood Glucose Levels Within Targeted Range  Outcome: Progressing  Intervention: Monitor and Manage Glycemia  Recent Flowsheet Documentation  Taken 10/22/2022 0800 by Milinda Pointer, RN  Glycemic Management: blood glucose monitored  Goal: Maintenance of Heart Failure Symptom Control  Outcome: Progressing  Goal: Blood Pressure in Desired Range  Outcome: Progressing  Goal: Maintenance of Osteoarthritis Symptom Control  Outcome: Progressing  Intervention: Maintain Osteoarthritis Symptom Control  Recent Flowsheet Documentation  Taken 10/22/2022 0800 by Milinda Pointer, RN  Activity Management: up in chair  Goal: Bariatric Home Regimen Maintained  Outcome: Progressing  Goal: Maintenance of Seizure Control  Outcome: Progressing     Problem: Skin Injury Risk Increased  Goal: Skin Health and Integrity  Outcome: Progressing  Intervention: Optimize Skin Protection  Recent Flowsheet Documentation  Taken 10/22/2022 0800 by Milinda Pointer, RN  Activity Management: up in chair  Pressure Reduction Techniques: frequent weight shift encouraged  Pressure Reduction Devices: pressure-redistributing mattress utilized     Problem: Wound  Goal: Optimal Coping  Outcome: Progressing  Goal: Optimal Functional Ability  Outcome: Progressing  Intervention: Optimize Functional Ability  Recent Flowsheet Documentation  Taken 10/22/2022 0800 by Milinda Pointer, RN  Activity Management: up in chair  Goal: Absence of Infection Signs and Symptoms  Outcome: Progressing  Goal: Improved Oral Intake  Outcome: Progressing  Goal: Optimal Pain Control and Function  Outcome: Progressing  Goal: Skin Health and Integrity  Outcome: Progressing  Intervention: Optimize Skin Protection  Recent Flowsheet Documentation  Taken 10/22/2022 0800 by Milinda Pointer, RN  Activity Management: up in chair  Pressure Reduction Techniques: frequent weight shift encouraged  Pressure Reduction Devices: pressure-redistributing mattress utilized  Goal: Optimal Wound Healing  Outcome: Progressing     Problem: Malnutrition  Goal: Improved Nutritional Intake  Outcome: Progressing

## 2022-10-22 NOTE — Unmapped (Signed)
Julie [name] alert and oriented. No prns given. Julie [name] on RA. Received iv abx. Tolerated meds well. Julie [name] sat in chair all night. No c/o pain. Blood glucose monitored. Received 6 units lantus. Save left arm. Mid abd incision with staples; open to air. Free from falls. Bed low and locked. Call bell in reach.    Problem: Adult Inpatient Plan of Care  Goal: Plan of Care Review  Outcome: Progressing  Goal: Patient-Specific Goal (Individualized)  Outcome: Progressing  Goal: Absence of Hospital-Acquired Illness or Injury  Outcome: Progressing  Intervention: Identify and Manage Fall Risk  Recent Flowsheet Documentation  Taken 10/21/2022 2045 by Barnett Applebaum' A, RN  Safety Interventions:   fall reduction program maintained   lighting adjusted for tasks/safety   low bed   nonskid shoes/slippers when out of bed  Intervention: Prevent and Manage VTE (Venous Thromboembolism) Risk  Recent Flowsheet Documentation  Taken 10/21/2022 2045 by Barnett Applebaum' A, RN  VTE Prevention/Management: anticoagulant therapy  Intervention: Prevent Infection  Recent Flowsheet Documentation  Taken 10/21/2022 2045 by Atwater-Rickard, Derrill Kay' A, RN  Infection Prevention:   hand hygiene promoted   rest/sleep promoted   single patient room provided  Goal: Optimal Comfort and Wellbeing  Outcome: Progressing  Goal: Readiness for Transition of Care  Outcome: Progressing  Goal: Rounds/Family Conference  Outcome: Progressing     Problem: Infection  Goal: Absence of Infection Signs and Symptoms  Outcome: Progressing  Intervention: Prevent or Manage Infection  Recent Flowsheet Documentation  Taken 10/21/2022 2045 by Barnett Applebaum' A, RN  Infection Management: aseptic technique maintained     Problem: Fall Injury Risk  Goal: Absence of Fall and Fall-Related Injury  Outcome: Progressing  Intervention: Promote Injury-Free Environment  Recent Flowsheet Documentation  Taken 10/21/2022 2045 by Barnett Applebaum' A, RN  Safety Interventions:   fall reduction program maintained   lighting adjusted for tasks/safety   low bed   nonskid shoes/slippers when out of bed     Problem: Self-Care Deficit  Goal: Improved Ability to Complete Activities of Daily Living  Outcome: Progressing     Problem: Comorbidity Management  Goal: Maintenance of Asthma Control  Outcome: Progressing  Goal: Maintenance of Behavioral Health Symptom Control  Outcome: Progressing  Goal: Maintenance of COPD Symptom Control  Outcome: Progressing  Goal: Blood Glucose Levels Within Targeted Range  Outcome: Progressing  Intervention: Monitor and Manage Glycemia  Recent Flowsheet Documentation  Taken 10/21/2022 2045 by Atwater-Rickard, Derrill Kay' A, RN  Glycemic Management: blood glucose monitored  Goal: Maintenance of Heart Failure Symptom Control  Outcome: Progressing  Goal: Blood Pressure in Desired Range  Outcome: Progressing  Goal: Maintenance of Osteoarthritis Symptom Control  Outcome: Progressing  Intervention: Maintain Osteoarthritis Symptom Control  Recent Flowsheet Documentation  Taken 10/21/2022 2045 by Atwater-Rickard, Derrill Kay' A, RN  Activity Management: up in chair  Goal: Bariatric Home Regimen Maintained  Outcome: Progressing  Goal: Maintenance of Seizure Control  Outcome: Progressing     Problem: Skin Injury Risk Increased  Goal: Skin Health and Integrity  Outcome: Progressing  Intervention: Optimize Skin Protection  Recent Flowsheet Documentation  Taken 10/21/2022 2045 by Atwater-Rickard, Derrill Kay' A, RN  Activity Management: up in chair  Head of Bed [facility]) Positioning: HOB elevated     Problem: Wound  Goal: Optimal Coping  Outcome: Progressing  Goal: Optimal Functional Ability  Outcome: Progressing  Intervention: Optimize Functional Ability  Recent Flowsheet Documentation  Taken 10/21/2022 2045 by Barnett Applebaum' A, RN  Activity Management: up in chair  Goal: Absence of Infection Signs  and Symptoms  Outcome: Progressing  Intervention: Prevent or Manage Infection  Recent Flowsheet Documentation  Taken 10/21/2022 2045 by Atwater-Rickard, Derrill Kay' A, RN  Infection Management: aseptic technique maintained  Goal: Improved Oral Intake  Outcome: Progressing  Goal: Optimal Pain Control and Function  Outcome: Progressing  Goal: Skin Health and Integrity  Outcome: Progressing  Intervention: Optimize Skin Protection  Recent Flowsheet Documentation  Taken 10/21/2022 2045 by Atwater-Rickard, Derrill Kay' A, RN  Activity Management: up in chair  Head of Bed (HOB) Positioning: HOB elevated  Goal: Optimal Wound Healing  Outcome: Progressing     Problem: Malnutrition  Goal: Improved Nutritional Intake  Outcome: Progressing

## 2022-10-23 DIAGNOSIS — Z940 Kidney transplant status: Principal | ICD-10-CM

## 2022-10-23 LAB — CBC
HEMATOCRIT: 25.6 % — ABNORMAL LOW (ref 34.0–44.0)
HEMOGLOBIN: 8.6 g/dL — ABNORMAL LOW (ref 11.3–14.9)
MEAN CORPUSCULAR HEMOGLOBIN CONC: 33.6 g/dL (ref 32.0–36.0)
MEAN CORPUSCULAR HEMOGLOBIN: 32.5 pg — ABNORMAL HIGH (ref 25.9–32.4)
MEAN CORPUSCULAR VOLUME: 96.6 fL — ABNORMAL HIGH (ref 77.6–95.7)
MEAN PLATELET VOLUME: 7.9 fL (ref 6.8–10.7)
PLATELET COUNT: 120 10*9/L — ABNORMAL LOW (ref 150–450)
RED BLOOD CELL COUNT: 2.65 10*12/L — ABNORMAL LOW (ref 3.95–5.13)
RED CELL DISTRIBUTION WIDTH: 16.4 % — ABNORMAL HIGH (ref 12.2–15.2)
WBC ADJUSTED: 2.3 10*9/L — ABNORMAL LOW (ref 3.6–11.2)

## 2022-10-23 LAB — RENAL FUNCTION PANEL
ALBUMIN: 3 g/dL — ABNORMAL LOW (ref 3.4–5.0)
ANION GAP: 6 mmol/L (ref 5–14)
BLOOD UREA NITROGEN: 16 mg/dL (ref 9–23)
BUN / CREAT RATIO: 21
CALCIUM: 8.8 mg/dL (ref 8.7–10.4)
CHLORIDE: 113 mmol/L — ABNORMAL HIGH (ref 98–107)
CO2: 24 mmol/L (ref 20.0–31.0)
CREATININE: 0.78 mg/dL
EGFR CKD-EPI (2021) FEMALE: 80 mL/min/{1.73_m2} (ref >=60)
GLUCOSE RANDOM: 87 mg/dL (ref 70–179)
PHOSPHORUS: 2.5 mg/dL (ref 2.4–5.1)
POTASSIUM: 5.7 mmol/L — ABNORMAL HIGH (ref 3.4–4.8)
SODIUM: 143 mmol/L (ref 135–145)

## 2022-10-23 LAB — TACROLIMUS LEVEL, TROUGH: TACROLIMUS, TROUGH: 8 ng/mL (ref 5.0–15.0)

## 2022-10-23 LAB — MAGNESIUM: MAGNESIUM: 2.1 mg/dL (ref 1.6–2.6)

## 2022-10-23 MED ADMIN — magnesium oxide-Mg AA chelate (Magnesium Plus Protein) 2 tablet: 2 | ORAL | @ 01:00:00

## 2022-10-23 MED ADMIN — heparin (porcine) 5,000 unit/mL injection 5,000 Units: 5000 [IU] | SUBCUTANEOUS | @ 03:00:00

## 2022-10-23 MED ADMIN — valGANciclovir (VALCYTE) tablet 450 mg: 450 mg | ORAL | @ 12:00:00 | Stop: 2022-11-05

## 2022-10-23 MED ADMIN — hydrALAZINE (APRESOLINE) tablet 50 mg: 50 mg | ORAL | @ 12:00:00

## 2022-10-23 MED ADMIN — sulfamethoxazole-trimethoprim (BACTRIM) 400-80 mg tablet 80 mg of trimethoprim: 1 | ORAL | @ 14:00:00 | Stop: 2023-02-04

## 2022-10-23 MED ADMIN — meropenem (MERREM) 1 g in sodium chloride 0.9 % (NS) 100 mL IVPB-MBP: 1 g | INTRAVENOUS | @ 01:00:00 | Stop: 2022-10-23

## 2022-10-23 MED ADMIN — levothyroxine (SYNTHROID) tablet 112 mcg: 112 ug | ORAL | @ 11:00:00

## 2022-10-23 MED ADMIN — pantoprazole (Protonix) EC tablet 40 mg: 40 mg | ORAL | @ 12:00:00

## 2022-10-23 MED ADMIN — methocarbamol (ROBAXIN) tablet 500 mg: 500 mg | ORAL | @ 11:00:00

## 2022-10-23 MED ADMIN — mycophenolate (CELLCEPT) tablet 500 mg: 500 mg | ORAL | @ 12:00:00

## 2022-10-23 MED ADMIN — insulin lispro (HumaLOG) injection 0-20 Units: 0-20 [IU] | SUBCUTANEOUS | @ 22:00:00

## 2022-10-23 MED ADMIN — heparin (porcine) 5,000 unit/mL injection 5,000 Units: 5000 [IU] | SUBCUTANEOUS | @ 11:00:00

## 2022-10-23 MED ADMIN — tacrolimus (PROGRAF) capsule 1 mg: 1 mg | ORAL | @ 01:00:00

## 2022-10-23 MED ADMIN — carvedilol (COREG) tablet 25 mg: 25 mg | ORAL | @ 01:00:00

## 2022-10-23 MED ADMIN — amlodipine (NORVASC) tablet 5 mg: 5 mg | ORAL | @ 12:00:00

## 2022-10-23 MED ADMIN — tacrolimus (PROGRAF) capsule 1 mg: 1 mg | ORAL | @ 12:00:00

## 2022-10-23 MED ADMIN — carvedilol (COREG) tablet 25 mg: 25 mg | ORAL | @ 12:00:00

## 2022-10-23 MED ADMIN — fluconazole (DIFLUCAN) tablet 400 mg: 400 mg | ORAL | @ 12:00:00 | Stop: 2022-10-31

## 2022-10-23 MED ADMIN — predniSONE (DELTASONE) tablet 5 mg: 5 mg | ORAL | @ 12:00:00

## 2022-10-23 MED ADMIN — melatonin tablet 6 mg: 6 mg | ORAL | @ 22:00:00

## 2022-10-23 MED ADMIN — amlodipine (NORVASC) tablet 5 mg: 5 mg | ORAL | @ 01:00:00

## 2022-10-23 MED ADMIN — methocarbamol (ROBAXIN) tablet 500 mg: 500 mg | ORAL | @ 22:00:00

## 2022-10-23 MED ADMIN — methocarbamol (ROBAXIN) tablet 500 mg: 500 mg | ORAL | @ 01:00:00

## 2022-10-23 MED ADMIN — melatonin tablet 6 mg: 6 mg | ORAL | @ 01:00:00

## 2022-10-23 MED ADMIN — atorvastatin (LIPITOR) tablet 10 mg: 10 mg | ORAL | @ 12:00:00

## 2022-10-23 MED ADMIN — methocarbamol (ROBAXIN) tablet 500 mg: 500 mg | ORAL | @ 16:00:00

## 2022-10-23 MED ADMIN — hydroCHLOROthiazide (HYDRODIURIL) tablet 12.5 mg: 12.5 mg | ORAL | @ 16:00:00

## 2022-10-23 MED ADMIN — heparin (porcine) 5,000 unit/mL injection 5,000 Units: 5000 [IU] | SUBCUTANEOUS | @ 19:00:00

## 2022-10-23 MED ADMIN — insulin glargine (LANTUS) injection 6 Units: 6 [IU] | SUBCUTANEOUS | @ 01:00:00

## 2022-10-23 MED ADMIN — meropenem (MERREM) 1 g in sodium chloride 0.9 % (NS) 100 mL IVPB-MBP: 1 g | INTRAVENOUS | @ 13:00:00 | Stop: 2022-10-23

## 2022-10-23 MED ADMIN — magnesium oxide-Mg AA chelate (Magnesium Plus Protein) 2 tablet: 2 | ORAL | @ 12:00:00

## 2022-10-23 MED ADMIN — mycophenolate (CELLCEPT) tablet 500 mg: 500 mg | ORAL | @ 01:00:00

## 2022-10-23 MED ADMIN — hydrALAZINE (APRESOLINE) tablet 50 mg: 50 mg | ORAL | @ 01:00:00

## 2022-10-23 NOTE — Unmapped (Signed)
Copy of Release of Info from [employer] received and scanned into HIM per request.      Eliezer Bottom, LCSW
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
## 2022-10-23 NOTE — Unmapped (Signed)
Pt alert and oriented. No prns given. Blood glucose monitored. Pt on RA. Save left arm. No c/o pain. Received iv abx. 6 units of lantus given. Tolerated meds well. Strict I&O. Free from falls. Bed low and locked. Call bell in reach.     Problem: Adult Inpatient Plan of Care  Goal: Plan of Care Review  Outcome: Progressing  Goal: Patient-Specific Goal (Individualized)  Outcome: Progressing  Goal: Absence of Hospital-Acquired Illness or Injury  Outcome: Progressing  Intervention: Identify and Manage Fall Risk  Recent Flowsheet Documentation  Taken 10/22/2022 2050 by Barnett Applebaum' A, RN  Safety Interventions:   fall reduction program maintained   lighting adjusted for tasks/safety   low bed   nonskid shoes/slippers when out of bed  Intervention: Prevent Skin Injury  Recent Flowsheet Documentation  Taken 10/22/2022 2050 by Barnett Applebaum' A, RN  Positioning for Skin: Sitting in Chair  Intervention: Prevent and Manage VTE (Venous Thromboembolism) Risk  Recent Flowsheet Documentation  Taken 10/22/2022 2050 by Barnett Applebaum' A, RN  VTE Prevention/Management: anticoagulant therapy  Intervention: Prevent Infection  Recent Flowsheet Documentation  Taken 10/22/2022 2050 by Barnett Applebaum' A, RN  Infection Prevention:   hand hygiene promoted   rest/sleep promoted   single patient room provided  Goal: Optimal Comfort and Wellbeing  Outcome: Progressing  Goal: Readiness for Transition of Care  Outcome: Progressing  Goal: Rounds/Family Conference  Outcome: Progressing     Problem: Infection  Goal: Absence of Infection Signs and Symptoms  Outcome: Progressing  Intervention: Prevent or Manage Infection  Recent Flowsheet Documentation  Taken 10/22/2022 2050 by Barnett Applebaum' A, RN  Infection Management: aseptic technique maintained     Problem: Fall Injury Risk  Goal: Absence of Fall and Fall-Related Injury  Outcome: Progressing  Intervention: Promote Injury-Free Environment  Recent Flowsheet Documentation  Taken 10/22/2022 2050 by Barnett Applebaum' A, RN  Safety Interventions:   fall reduction program maintained   lighting adjusted for tasks/safety   low bed   nonskid shoes/slippers when out of bed     Problem: Self-Care Deficit  Goal: Improved Ability to Complete Activities of Daily Living  Outcome: Progressing     Problem: Comorbidity Management  Goal: Maintenance of Asthma Control  Outcome: Progressing  Goal: Maintenance of Behavioral Health Symptom Control  Outcome: Progressing  Goal: Maintenance of COPD Symptom Control  Outcome: Progressing  Goal: Blood Glucose Levels Within Targeted Range  Outcome: Progressing  Goal: Maintenance of Heart Failure Symptom Control  Outcome: Progressing  Goal: Blood Pressure in Desired Range  Outcome: Progressing  Goal: Maintenance of Osteoarthritis Symptom Control  Outcome: Progressing  Intervention: Maintain Osteoarthritis Symptom Control  Recent Flowsheet Documentation  Taken 10/22/2022 2050 by Atwater-Rickard, Derrill Kay' A, RN  Activity Management: up in chair  Goal: Bariatric Home Regimen Maintained  Outcome: Progressing  Goal: Maintenance of Seizure Control  Outcome: Progressing     Problem: Skin Injury Risk Increased  Goal: Skin Health and Integrity  Outcome: Progressing  Intervention: Optimize Skin Protection  Recent Flowsheet Documentation  Taken 10/22/2022 2050 by Atwater-Rickard, Derrill Kay' A, RN  Activity Management: up in chair  Head of Bed (HOB) Positioning: HOB elevated     Problem: Wound  Goal: Optimal Coping  Outcome: Progressing  Goal: Optimal Functional Ability  Outcome: Progressing  Intervention: Optimize Functional Ability  Recent Flowsheet Documentation  Taken 10/22/2022 2050 by Atwater-Rickard, Derrill Kay' A, RN  Activity Management: up in chair  Goal: Absence of Infection Signs and Symptoms  Outcome: Progressing  Intervention: Prevent or Manage  Infection  Recent Flowsheet Documentation  Taken 10/22/2022 2050 by Barnett Applebaum' A, RN  Infection Management: aseptic technique maintained  Goal: Improved Oral Intake  Outcome: Progressing  Goal: Optimal Pain Control and Function  Outcome: Progressing  Goal: Skin Health and Integrity  Outcome: Progressing  Intervention: Optimize Skin Protection  Recent Flowsheet Documentation  Taken 10/22/2022 2050 by Barnett Applebaum' A, RN  Activity Management: up in chair  Head of Bed (HOB) Positioning: HOB elevated  Goal: Optimal Wound Healing  Outcome: Progressing     Problem: Malnutrition  Goal: Improved Nutritional Intake  Outcome: Progressing

## 2022-10-23 NOTE — Unmapped (Signed)
Pt free of falls. BP elevated in am, cardiac meds provided. Other VSS. IV antibiotics provided. BG monitored and wnl. Up to chair and bathroom independently. Pt declined getting Central line removed if she was going to have to get a new PIV. Staples in place in abd, site clean dry, intact with no signs of bleeding, and is healing well. Pt denies pain.   Problem: Adult Inpatient Plan of Care  Goal: Plan of Care Review  Outcome: Progressing  Goal: Patient-Specific Goal (Individualized)  Outcome: Progressing  Goal: Absence of Hospital-Acquired Illness or Injury  Outcome: Progressing  Intervention: Identify and Manage Fall Risk  Recent Flowsheet Documentation  Taken 10/23/2022 0800 by Janett Labella, RN  Safety Interventions:   fall reduction program maintained   low bed  Intervention: Prevent Skin Injury  Recent Flowsheet Documentation  Taken 10/23/2022 0815 by Janett Labella, RN  Positioning for Skin: Sitting in Chair  Device Skin Pressure Protection: positioning supports utilized  Skin Protection: adhesive use limited  Intervention: Prevent and Manage VTE (Venous Thromboembolism) Risk  Recent Flowsheet Documentation  Taken 10/23/2022 0815 by Janett Labella, RN  VTE Prevention/Management: ambulation promoted  Intervention: Prevent Infection  Recent Flowsheet Documentation  Taken 10/23/2022 0800 by Janett Labella, RN  Infection Prevention: single patient room provided  Goal: Optimal Comfort and Wellbeing  Outcome: Progressing  Goal: Readiness for Transition of Care  Outcome: Progressing  Goal: Rounds/Family Conference  Outcome: Progressing     Problem: Infection  Goal: Absence of Infection Signs and Symptoms  Outcome: Progressing  Intervention: Prevent or Manage Infection  Recent Flowsheet Documentation  Taken 10/23/2022 0800 by Michaelle Birks D, RN  Infection Management: aseptic technique maintained     Problem: Fall Injury Risk  Goal: Absence of Fall and Fall-Related Injury  Outcome: Progressing  Intervention: Identify and Manage Contributors  Recent Flowsheet Documentation  Taken 10/23/2022 0815 by Janett Labella, RN  Self-Care Promotion:   independence encouraged   BADL personal objects within reach  Intervention: Promote Injury-Free Environment  Recent Flowsheet Documentation  Taken 10/23/2022 0800 by Janett Labella, RN  Safety Interventions:   fall reduction program maintained   low bed     Problem: Self-Care Deficit  Goal: Improved Ability to Complete Activities of Daily Living  Outcome: Progressing  Intervention: Promote Activity and Functional Independence  Recent Flowsheet Documentation  Taken 10/23/2022 0815 by Janett Labella, RN  Self-Care Promotion:   independence encouraged   BADL personal objects within reach     Problem: Comorbidity Management  Goal: Maintenance of Asthma Control  Outcome: Progressing  Goal: Maintenance of Behavioral Health Symptom Control  Outcome: Progressing  Goal: Maintenance of COPD Symptom Control  Outcome: Progressing  Goal: Blood Glucose Levels Within Targeted Range  Outcome: Progressing  Intervention: Monitor and Manage Glycemia  Recent Flowsheet Documentation  Taken 10/23/2022 0800 by Michaelle Birks D, RN  Glycemic Management: blood glucose monitored  Goal: Maintenance of Heart Failure Symptom Control  Outcome: Progressing  Goal: Blood Pressure in Desired Range  Outcome: Progressing  Goal: Maintenance of Osteoarthritis Symptom Control  Outcome: Progressing  Intervention: Maintain Osteoarthritis Symptom Control  Recent Flowsheet Documentation  Taken 10/23/2022 1000 by Janett Labella, RN  Activity Management: up in chair  Taken 10/23/2022 0900 by Janett Labella, RN  Activity Management: up in chair  Taken 10/23/2022 0822 by Janett Labella, RN  Activity Management: ambulated to bathroom  Goal: Bariatric Home Regimen Maintained  Outcome: Progressing  Goal: Maintenance of  Seizure Control  Outcome: Progressing

## 2022-10-24 DIAGNOSIS — Z940 Kidney transplant status: Principal | ICD-10-CM

## 2022-10-24 LAB — RENAL FUNCTION PANEL
ALBUMIN: 3.1 g/dL — ABNORMAL LOW (ref 3.4–5.0)
ANION GAP: 6 mmol/L (ref 5–14)
BLOOD UREA NITROGEN: 15 mg/dL (ref 9–23)
BUN / CREAT RATIO: 18
CALCIUM: 8.8 mg/dL (ref 8.7–10.4)
CHLORIDE: 109 mmol/L — ABNORMAL HIGH (ref 98–107)
CO2: 23 mmol/L (ref 20.0–31.0)
CREATININE: 0.83 mg/dL
EGFR CKD-EPI (2021) FEMALE: 75 mL/min/{1.73_m2} (ref >=60)
GLUCOSE RANDOM: 105 mg/dL (ref 70–179)
PHOSPHORUS: 2.2 mg/dL — ABNORMAL LOW (ref 2.4–5.1)
POTASSIUM: 6 mmol/L — ABNORMAL HIGH (ref 3.4–4.8)
SODIUM: 138 mmol/L (ref 135–145)

## 2022-10-24 LAB — CBC
HEMATOCRIT: 25.4 % — ABNORMAL LOW (ref 34.0–44.0)
HEMOGLOBIN: 8.6 g/dL — ABNORMAL LOW (ref 11.3–14.9)
MEAN CORPUSCULAR HEMOGLOBIN CONC: 34 g/dL (ref 32.0–36.0)
MEAN CORPUSCULAR HEMOGLOBIN: 33 pg — ABNORMAL HIGH (ref 25.9–32.4)
MEAN CORPUSCULAR VOLUME: 97.2 fL — ABNORMAL HIGH (ref 77.6–95.7)
MEAN PLATELET VOLUME: 7.7 fL (ref 6.8–10.7)
PLATELET COUNT: 125 10*9/L — ABNORMAL LOW (ref 150–450)
RED BLOOD CELL COUNT: 2.62 10*12/L — ABNORMAL LOW (ref 3.95–5.13)
RED CELL DISTRIBUTION WIDTH: 16.5 % — ABNORMAL HIGH (ref 12.2–15.2)
WBC ADJUSTED: 2.8 10*9/L — ABNORMAL LOW (ref 3.6–11.2)

## 2022-10-24 LAB — TACROLIMUS LEVEL, TROUGH: TACROLIMUS, TROUGH: 11 ng/mL (ref 5.0–15.0)

## 2022-10-24 LAB — MAGNESIUM: MAGNESIUM: 1.8 mg/dL (ref 1.6–2.6)

## 2022-10-24 MED ADMIN — tacrolimus (PROGRAF) capsule 1 mg: 1 mg | ORAL | @ 01:00:00

## 2022-10-24 MED ADMIN — methocarbamol (ROBAXIN) tablet 500 mg: 500 mg | ORAL | @ 10:00:00

## 2022-10-24 MED ADMIN — hydrALAZINE (APRESOLINE) tablet 50 mg: 50 mg | ORAL | @ 12:00:00

## 2022-10-24 MED ADMIN — predniSONE (DELTASONE) tablet 5 mg: 5 mg | ORAL | @ 12:00:00

## 2022-10-24 MED ADMIN — amlodipine (NORVASC) tablet 5 mg: 5 mg | ORAL | @ 12:00:00

## 2022-10-24 MED ADMIN — tacrolimus (PROGRAF) capsule 1 mg: 1 mg | ORAL | @ 12:00:00 | Stop: 2022-10-24

## 2022-10-24 MED ADMIN — calcium carbonate (TUMS) chewable tablet 200 mg of elem calcium: 200 mg | ORAL | @ 12:00:00

## 2022-10-24 MED ADMIN — methocarbamol (ROBAXIN) tablet 500 mg: 500 mg | ORAL | @ 01:00:00

## 2022-10-24 MED ADMIN — insulin glargine (LANTUS) injection 6 Units: 6 [IU] | SUBCUTANEOUS | @ 01:00:00

## 2022-10-24 MED ADMIN — pantoprazole (Protonix) EC tablet 40 mg: 40 mg | ORAL | @ 12:00:00

## 2022-10-24 MED ADMIN — mycophenolate (CELLCEPT) tablet 500 mg: 500 mg | ORAL | @ 12:00:00

## 2022-10-24 MED ADMIN — fluconazole (DIFLUCAN) tablet 400 mg: 400 mg | ORAL | @ 12:00:00 | Stop: 2022-10-31

## 2022-10-24 MED ADMIN — melatonin tablet 6 mg: 6 mg | ORAL | @ 22:00:00

## 2022-10-24 MED ADMIN — carvedilol (COREG) tablet 25 mg: 25 mg | ORAL | @ 01:00:00

## 2022-10-24 MED ADMIN — mycophenolate (CELLCEPT) tablet 500 mg: 500 mg | ORAL | @ 01:00:00

## 2022-10-24 MED ADMIN — methocarbamol (ROBAXIN) tablet 500 mg: 500 mg | ORAL | @ 22:00:00

## 2022-10-24 MED ADMIN — magnesium oxide-Mg AA chelate (Magnesium Plus Protein) 2 tablet: 2 | ORAL | @ 12:00:00

## 2022-10-24 MED ADMIN — amlodipine (NORVASC) tablet 5 mg: 5 mg | ORAL | @ 01:00:00

## 2022-10-24 MED ADMIN — heparin (porcine) 5,000 unit/mL injection 5,000 Units: 5000 [IU] | SUBCUTANEOUS | @ 01:00:00

## 2022-10-24 MED ADMIN — methocarbamol (ROBAXIN) tablet 500 mg: 500 mg | ORAL | @ 16:00:00

## 2022-10-24 MED ADMIN — hydrALAZINE (APRESOLINE) tablet 50 mg: 50 mg | ORAL | @ 01:00:00

## 2022-10-24 MED ADMIN — magnesium oxide-Mg AA chelate (Magnesium Plus Protein) 2 tablet: 2 | ORAL | @ 01:00:00

## 2022-10-24 MED ADMIN — chlorthalidone (HYGROTON) tablet 25 mg: 25 mg | ORAL | @ 13:00:00

## 2022-10-24 MED ADMIN — atorvastatin (LIPITOR) tablet 10 mg: 10 mg | ORAL | @ 12:00:00

## 2022-10-24 MED ADMIN — heparin (porcine) 5,000 unit/mL injection 5,000 Units: 5000 [IU] | SUBCUTANEOUS | @ 18:00:00

## 2022-10-24 MED ADMIN — sodium zirconium cyclosilicate (LOKELMA) packet 10 g: 10 g | ORAL | @ 13:00:00 | Stop: 2022-10-24

## 2022-10-24 MED ADMIN — insulin lispro (HumaLOG) injection 0-20 Units: 0-20 [IU] | SUBCUTANEOUS | @ 23:00:00

## 2022-10-24 MED ADMIN — heparin (porcine) 5,000 unit/mL injection 5,000 Units: 5000 [IU] | SUBCUTANEOUS | @ 10:00:00

## 2022-10-24 MED ADMIN — carvedilol (COREG) tablet 25 mg: 25 mg | ORAL | @ 12:00:00

## 2022-10-24 MED ADMIN — levothyroxine (SYNTHROID) tablet 112 mcg: 112 ug | ORAL | @ 10:00:00

## 2022-10-24 NOTE — Unmapped (Signed)
Pt free of falls. VSS. Cardiac and renal meds provided. Pt declined to central line removal because she is a heard stick and does not want to get PIV. MD advised. Pt counseled about the infection risk of central lines and advantages to remove it. BG monitored and wnl so far this shift. Up to bathroom and chair independently. Lokemla provided to manage high Potassium. Education about  high and ow potassium foods provided. Sx site clean dry and intact with no drainage or bleeding. Marland Kitchen Appetite improved.   Problem: Adult Inpatient Plan of Care  Goal: Plan of Care Review  Outcome: Progressing  Goal: Patient-Specific Goal (Individualized)  Outcome: Progressing  Goal: Absence of Hospital-Acquired Illness or Injury  Outcome: Progressing  Intervention: Identify and Manage Fall Risk  Recent Flowsheet Documentation  Taken 10/24/2022 0800 by Janett Labella, RN  Safety Interventions:   fall reduction program maintained   low bed   nonskid shoes/slippers when out of bed   lighting adjusted for tasks/safety  Intervention: Prevent Skin Injury  Recent Flowsheet Documentation  Taken 10/24/2022 0800 by Janett Labella, RN  Positioning for Skin: Sitting in Chair  Device Skin Pressure Protection: positioning supports utilized  Skin Protection: adhesive use limited  Intervention: Prevent and Manage VTE (Venous Thromboembolism) Risk  Recent Flowsheet Documentation  Taken 10/24/2022 0800 by Janett Labella, RN  VTE Prevention/Management: ambulation promoted  Intervention: Prevent Infection  Recent Flowsheet Documentation  Taken 10/24/2022 0800 by Janett Labella, RN  Infection Prevention:   rest/sleep promoted   single patient room provided  Goal: Optimal Comfort and Wellbeing  Outcome: Progressing  Goal: Readiness for Transition of Care  Outcome: Progressing  Goal: Rounds/Family Conference  Outcome: Progressing     Problem: Fall Injury Risk  Goal: Absence of Fall and Fall-Related Injury  Outcome: Progressing  Intervention: Identify and Manage Contributors  Recent Flowsheet Documentation  Taken 10/24/2022 0800 by Janett Labella, RN  Self-Care Promotion: independence encouraged  Intervention: Promote Injury-Free Environment  Recent Flowsheet Documentation  Taken 10/24/2022 0800 by Janett Labella, RN  Safety Interventions:   fall reduction program maintained   low bed   nonskid shoes/slippers when out of bed   lighting adjusted for tasks/safety     Problem: Comorbidity Management  Goal: Maintenance of Asthma Control  Outcome: Progressing  Goal: Maintenance of Behavioral Health Symptom Control  Outcome: Progressing  Goal: Maintenance of COPD Symptom Control  Outcome: Progressing  Goal: Blood Glucose Levels Within Targeted Range  Outcome: Progressing  Intervention: Monitor and Manage Glycemia  Recent Flowsheet Documentation  Taken 10/24/2022 0800 by Michaelle Birks D, RN  Glycemic Management: blood glucose monitored  Goal: Maintenance of Heart Failure Symptom Control  Outcome: Progressing  Goal: Blood Pressure in Desired Range  Outcome: Progressing  Goal: Maintenance of Osteoarthritis Symptom Control  Outcome: Progressing  Goal: Bariatric Home Regimen Maintained  Outcome: Progressing  Goal: Maintenance of Seizure Control  Outcome: Progressing     Problem: Skin Injury Risk Increased  Goal: Skin Health and Integrity  Outcome: Progressing  Intervention: Optimize Skin Protection  Recent Flowsheet Documentation  Taken 10/24/2022 0800 by Janett Labella, RN  Pressure Reduction Techniques: frequent weight shift encouraged  Pressure Reduction Devices: positioning supports utilized  Skin Protection: adhesive use limited

## 2022-10-24 NOTE — Unmapped (Signed)
Pt alert and oriented. No prns given. Save left arm. Blood glucose monitored. 6 units lantus given. Strict I&O. Tolerated meds well. Labs collected. No c/o pain. Pt on RA. Free from falls. Bed low and locked. Call bell in reach.    Problem: Adult Inpatient Plan of Care  Goal: Plan of Care Review  Outcome: Progressing  Goal: Patient-Specific Goal (Individualized)  Outcome: Progressing  Goal: Absence of Hospital-Acquired Illness or Injury  Outcome: Progressing  Intervention: Identify and Manage Fall Risk  Recent Flowsheet Documentation  Taken 10/23/2022 2045 by Barnett Applebaum' A, RN  Safety Interventions:   fall reduction program maintained   lighting adjusted for tasks/safety   low bed   nonskid shoes/slippers when out of bed  Intervention: Prevent Infection  Recent Flowsheet Documentation  Taken 10/23/2022 2045 by Barnett Applebaum' A, RN  Infection Prevention:   hand hygiene promoted   rest/sleep promoted   single patient room provided  Goal: Optimal Comfort and Wellbeing  Outcome: Progressing  Goal: Readiness for Transition of Care  Outcome: Progressing  Goal: Rounds/Family Conference  Outcome: Progressing     Problem: Infection  Goal: Absence of Infection Signs and Symptoms  Outcome: Progressing  Intervention: Prevent or Manage Infection  Recent Flowsheet Documentation  Taken 10/23/2022 2045 by Barnett Applebaum' A, RN  Infection Management: aseptic technique maintained     Problem: Fall Injury Risk  Goal: Absence of Fall and Fall-Related Injury  Outcome: Progressing  Intervention: Promote Injury-Free Environment  Recent Flowsheet Documentation  Taken 10/23/2022 2045 by Barnett Applebaum' A, RN  Safety Interventions:   fall reduction program maintained   lighting adjusted for tasks/safety   low bed   nonskid shoes/slippers when out of bed     Problem: Self-Care Deficit  Goal: Improved Ability to Complete Activities of Daily Living  Outcome: Progressing     Problem: Comorbidity Management  Goal: Maintenance of Asthma Control  Outcome: Progressing  Goal: Maintenance of Behavioral Health Symptom Control  Outcome: Progressing  Goal: Maintenance of COPD Symptom Control  Outcome: Progressing  Goal: Blood Glucose Levels Within Targeted Range  Outcome: Progressing  Goal: Maintenance of Heart Failure Symptom Control  Outcome: Progressing  Goal: Blood Pressure in Desired Range  Outcome: Progressing  Goal: Maintenance of Osteoarthritis Symptom Control  Outcome: Progressing  Intervention: Maintain Osteoarthritis Symptom Control  Recent Flowsheet Documentation  Taken 10/23/2022 2045 by Atwater-Rickard, Derrill Kay' A, RN  Activity Management: back to bed  Goal: Bariatric Home Regimen Maintained  Outcome: Progressing  Goal: Maintenance of Seizure Control  Outcome: Progressing     Problem: Skin Injury Risk Increased  Goal: Skin Health and Integrity  Outcome: Progressing  Intervention: Optimize Skin Protection  Recent Flowsheet Documentation  Taken 10/23/2022 2045 by Atwater-Rickard, Derrill Kay' A, RN  Activity Management: back to bed  Head of Bed (HOB) Positioning: HOB elevated     Problem: Wound  Goal: Optimal Coping  Outcome: Progressing  Goal: Optimal Functional Ability  Outcome: Progressing  Intervention: Optimize Functional Ability  Recent Flowsheet Documentation  Taken 10/23/2022 2045 by Atwater-Rickard, Derrill Kay' A, RN  Activity Management: back to bed  Goal: Absence of Infection Signs and Symptoms  Outcome: Progressing  Intervention: Prevent or Manage Infection  Recent Flowsheet Documentation  Taken 10/23/2022 2045 by Barnett Applebaum' A, RN  Infection Management: aseptic technique maintained  Goal: Improved Oral Intake  Outcome: Progressing  Goal: Optimal Pain Control and Function  Outcome: Progressing  Goal: Skin Health and Integrity  Outcome: Progressing  Intervention: Optimize Skin Protection  Recent Flowsheet  Documentation  Taken 10/23/2022 2045 by Barnett Applebaum' A, RN  Activity Management: back to bed  Head of Bed [facility]) Positioning: HOB elevated  Goal: Optimal Wound Healing  Outcome: Progressing

## 2022-10-24 NOTE — Unmapped (Signed)
Tacrolimus Therapeutic Monitoring Pharmacy Note    Julie [name] is a 72 y.o. female continuing tacrolimus.     Indication: Kidney transplant     Date of Transplant:  08/06/22       Prior Dosing Information: Current regimen tacrolimus 1 mg PO BID     Home regimen: Prograf 2 mg BID    Source(s) of information used to determine prior to admission dosing: Home Medication List or Clinic Note    Goals:  Therapeutic Drug Levels  Tacrolimus trough goal:  6-8 ng/mL - goal reduced with txp attending on 4/2    Additional Clinical Monitoring/Outcomes  Monitor renal function (SCr and urine output) and liver function (LFTs)  Monitor for signs/symptoms of adverse events (e.g., hyperglycemia, hyperkalemia, hypomagnesemia, hypertension, headache, tremor)    Results:   Tacrolimus level:  11.0 ng/mL, drawn ~3 hours early      Pharmacokinetic Considerations and Significant Drug Interactions:  Concurrent hepatotoxic medications: None identified  Concurrent CYP3A4 substrates/inhibitors: None identified  Concurrent nephrotoxic medications: None identified    Assessment/Plan:  Recommendedation(s)  Decrease to tac 1 mg in the morning and 0.5 mg at night    Follow-up  Daily levels currently ordered .   A pharmacist will continue to monitor and recommend levels as appropriate    Please page service pharmacist with questions/clarifications.    [place] M Maddelynn Moosman, PharmD
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
## 2022-10-25 LAB — CBC
HEMATOCRIT: 25 % — ABNORMAL LOW (ref 34.0–44.0)
HEMOGLOBIN: 8.6 g/dL — ABNORMAL LOW (ref 11.3–14.9)
MEAN CORPUSCULAR HEMOGLOBIN CONC: 34.2 g/dL (ref 32.0–36.0)
MEAN CORPUSCULAR HEMOGLOBIN: 33 pg — ABNORMAL HIGH (ref 25.9–32.4)
MEAN CORPUSCULAR VOLUME: 96.7 fL — ABNORMAL HIGH (ref 77.6–95.7)
MEAN PLATELET VOLUME: 7.7 fL (ref 6.8–10.7)
PLATELET COUNT: 106 10*9/L — ABNORMAL LOW (ref 150–450)
RED BLOOD CELL COUNT: 2.59 10*12/L — ABNORMAL LOW (ref 3.95–5.13)
RED CELL DISTRIBUTION WIDTH: 16.3 % — ABNORMAL HIGH (ref 12.2–15.2)
WBC ADJUSTED: 2.5 10*9/L — ABNORMAL LOW (ref 3.6–11.2)

## 2022-10-25 LAB — RENAL FUNCTION PANEL
ALBUMIN: 3 g/dL — ABNORMAL LOW (ref 3.4–5.0)
ANION GAP: 6 mmol/L (ref 5–14)
BLOOD UREA NITROGEN: 18 mg/dL (ref 9–23)
BUN / CREAT RATIO: 19
CALCIUM: 9.2 mg/dL (ref 8.7–10.4)
CHLORIDE: 111 mmol/L — ABNORMAL HIGH (ref 98–107)
CO2: 24 mmol/L (ref 20.0–31.0)
CREATININE: 0.94 mg/dL
EGFR CKD-EPI (2021) FEMALE: 64 mL/min/{1.73_m2} (ref >=60)
GLUCOSE RANDOM: 94 mg/dL (ref 70–179)
PHOSPHORUS: 3.4 mg/dL (ref 2.4–5.1)
POTASSIUM: 5.3 mmol/L — ABNORMAL HIGH (ref 3.4–4.8)
SODIUM: 141 mmol/L (ref 135–145)

## 2022-10-25 LAB — MAGNESIUM: MAGNESIUM: 1.8 mg/dL (ref 1.6–2.6)

## 2022-10-25 LAB — TACROLIMUS LEVEL, TROUGH: TACROLIMUS, TROUGH: 9 ng/mL (ref 5.0–15.0)

## 2022-10-25 MED ORDER — VALGANCICLOVIR 450 MG TABLET
ORAL_TABLET | Freq: Every day | ORAL | 0 refills | 90 days | Status: CP
Start: 2022-10-25 — End: 2023-01-23

## 2022-10-25 MED ORDER — CARVEDILOL 25 MG TABLET
ORAL_TABLET | Freq: Two times a day (BID) | ORAL | 0 refills | 30 days | Status: CN
Start: 2022-10-25 — End: 2022-11-24

## 2022-10-25 MED ORDER — MYCOPHENOLATE MOFETIL 500 MG TABLET
ORAL_TABLET | Freq: Two times a day (BID) | ORAL | 11 refills | 30 days | Status: CN
Start: 2022-10-25 — End: 2023-10-25

## 2022-10-25 MED ORDER — HYDRALAZINE 50 MG TABLET
ORAL_TABLET | Freq: Two times a day (BID) | ORAL | 0 refills | 30 days | Status: CP
Start: 2022-10-25 — End: 2022-11-24

## 2022-10-25 MED ORDER — MAGNESIUM OXIDE-MAGNESIUM AMINO ACID CHELATE 133 MG TABLET
ORAL_TABLET | Freq: Two times a day (BID) | ORAL | 0 refills | 23 days | Status: CP
Start: 2022-10-25 — End: ?

## 2022-10-25 MED ORDER — MYCOPHENOLATE MOFETIL 250 MG CAPSULE
ORAL_CAPSULE | Freq: Two times a day (BID) | ORAL | 3 refills | 90 days | Status: CP
Start: 2022-10-25 — End: 2023-10-25
  Filled 2022-10-25: qty 360, 90d supply, fill #0

## 2022-10-25 MED ORDER — INSULIN GLARGINE (U-100) 100 UNIT/ML (3 ML) SUBCUTANEOUS PEN
Freq: Every evening | SUBCUTANEOUS | 0 refills | 30 days | Status: CP
Start: 2022-10-25 — End: 2022-11-24

## 2022-10-25 MED ORDER — MELATONIN 3 MG TABLET
ORAL_TABLET | Freq: Every evening | ORAL | 0 refills | 15 days | Status: CP
Start: 2022-10-25 — End: ?

## 2022-10-25 MED ORDER — CALCIUM CARBONATE 200 MG CALCIUM (500 MG) CHEWABLE TABLET
ORAL_TABLET | Freq: Three times a day (TID) | ORAL | 0 refills | 30 days | Status: CP | PRN
Start: 2022-10-25 — End: 2022-11-24

## 2022-10-25 MED ORDER — INSULIN GLARGINE (U-100) 100 UNIT/ML SUBCUTANEOUS SOLUTION
Freq: Every evening | SUBCUTANEOUS | 0 refills | 30 days | Status: CN
Start: 2022-10-25 — End: 2022-11-24

## 2022-10-25 MED ORDER — AMLODIPINE 5 MG TABLET
ORAL_TABLET | Freq: Two times a day (BID) | ORAL | 3 refills | 45 days | Status: CP
Start: 2022-10-25 — End: 2023-10-25

## 2022-10-25 MED ORDER — METHOCARBAMOL 500 MG TABLET
ORAL_TABLET | Freq: Four times a day (QID) | ORAL | 0 refills | 8 days | Status: CP | PRN
Start: 2022-10-25 — End: ?

## 2022-10-25 MED ORDER — TACROLIMUS 0.5 MG CAPSULE, IMMEDIATE-RELEASE
ORAL_CAPSULE | ORAL | 0 refills | 10.00000 days | Status: CP
Start: 2022-10-25 — End: 2022-10-25
  Filled 2022-10-25: qty 90, 30d supply, fill #0

## 2022-10-25 MED ADMIN — atorvastatin (LIPITOR) tablet 10 mg: 10 mg | ORAL | @ 12:00:00 | Stop: 2022-10-25

## 2022-10-25 MED ADMIN — amlodipine (NORVASC) tablet 5 mg: 5 mg | ORAL | @ 12:00:00 | Stop: 2022-10-25

## 2022-10-25 MED ADMIN — methocarbamol (ROBAXIN) tablet 500 mg: 500 mg | ORAL

## 2022-10-25 MED ADMIN — hydrALAZINE (APRESOLINE) tablet 50 mg: 50 mg | ORAL | @ 12:00:00 | Stop: 2022-10-25

## 2022-10-25 MED ADMIN — mycophenolate (CELLCEPT) tablet 500 mg: 500 mg | ORAL | @ 12:00:00 | Stop: 2022-10-25

## 2022-10-25 MED ADMIN — insulin lispro (HumaLOG) injection 0-20 Units: 0-20 [IU] | SUBCUTANEOUS | @ 17:00:00 | Stop: 2022-10-25

## 2022-10-25 MED ADMIN — methocarbamol (ROBAXIN) tablet 500 mg: 500 mg | ORAL | @ 09:00:00 | Stop: 2022-10-25

## 2022-10-25 MED ADMIN — heparin (porcine) 5,000 unit/mL injection 5,000 Units: 5000 [IU] | SUBCUTANEOUS | @ 03:00:00

## 2022-10-25 MED ADMIN — carvedilol (COREG) tablet 25 mg: 25 mg | ORAL | @ 12:00:00 | Stop: 2022-10-25

## 2022-10-25 MED ADMIN — hydrALAZINE (APRESOLINE) tablet 50 mg: 50 mg | ORAL

## 2022-10-25 MED ADMIN — tacrolimus (PROGRAF) capsule 1 mg: 1 mg | ORAL | @ 12:00:00 | Stop: 2022-10-25

## 2022-10-25 MED ADMIN — fluconazole (DIFLUCAN) tablet 400 mg: 400 mg | ORAL | @ 12:00:00 | Stop: 2022-10-25

## 2022-10-25 MED ADMIN — chlorthalidone (HYGROTON) tablet 25 mg: 25 mg | ORAL | @ 12:00:00 | Stop: 2022-10-25

## 2022-10-25 MED ADMIN — methocarbamol (ROBAXIN) tablet 500 mg: 500 mg | ORAL | @ 17:00:00 | Stop: 2022-10-25

## 2022-10-25 MED ADMIN — tacrolimus (PROGRAF) capsule 0.5 mg: .5 mg | ORAL

## 2022-10-25 MED ADMIN — pantoprazole (Protonix) EC tablet 40 mg: 40 mg | ORAL | @ 12:00:00 | Stop: 2022-10-25

## 2022-10-25 MED ADMIN — magnesium oxide-Mg AA chelate (Magnesium Plus Protein) 2 tablet: 2 | ORAL

## 2022-10-25 MED ADMIN — mycophenolate (CELLCEPT) tablet 500 mg: 500 mg | ORAL

## 2022-10-25 MED ADMIN — valGANciclovir (VALCYTE) tablet 450 mg: 450 mg | ORAL | @ 12:00:00 | Stop: 2022-10-25

## 2022-10-25 MED ADMIN — sodium zirconium cyclosilicate (LOKELMA) packet 10 g: 10 g | ORAL | Stop: 2022-10-24

## 2022-10-25 MED ADMIN — insulin glargine (LANTUS) injection 6 Units: 6 [IU] | SUBCUTANEOUS | @ 03:00:00

## 2022-10-25 MED ADMIN — heparin (porcine) 5,000 unit/mL injection 5,000 Units: 5000 [IU] | SUBCUTANEOUS | @ 09:00:00 | Stop: 2022-10-25

## 2022-10-25 MED ADMIN — magnesium oxide-Mg AA chelate (Magnesium Plus Protein) 2 tablet: 2 | ORAL | @ 12:00:00 | Stop: 2022-10-25

## 2022-10-25 MED ADMIN — sulfamethoxazole-trimethoprim (BACTRIM) 400-80 mg tablet 80 mg of trimethoprim: 1 | ORAL | @ 14:00:00 | Stop: 2022-10-25

## 2022-10-25 MED ADMIN — levothyroxine (SYNTHROID) tablet 112 mcg: 112 ug | ORAL | @ 09:00:00 | Stop: 2022-10-25

## 2022-10-25 MED ADMIN — carvedilol (COREG) tablet 25 mg: 25 mg | ORAL

## 2022-10-25 MED ADMIN — amlodipine (NORVASC) tablet 5 mg: 5 mg | ORAL

## 2022-10-25 MED ADMIN — predniSONE (DELTASONE) tablet 5 mg: 5 mg | ORAL | @ 12:00:00 | Stop: 2022-10-25
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
## 2022-10-25 NOTE — Unmapped (Signed)
Pt remained free from falls and injury this shift. VSS, on RA. No c/o pain. Ambulates independently. Blood glucose monitored. Pt declined removal of triple lumen this shift. Bed in low locked position, call bell in reach.   Problem: Adult Inpatient Plan of Care  Goal: Plan of Care Review  Outcome: Progressing  Goal: Patient-Specific Goal (Individualized)  Outcome: Progressing  Goal: Absence of Hospital-Acquired Illness or Injury  Outcome: Progressing  Intervention: Identify and Manage Fall Risk  Recent Flowsheet Documentation  Taken 10/24/2022 1931 by Candie Chroman, RN  Safety Interventions:   low bed   lighting adjusted for tasks/safety   fall reduction program maintained  Intervention: Prevent Skin Injury  Recent Flowsheet Documentation  Taken 10/24/2022 1931 by Candie Chroman, RN  Positioning for Skin: Supine/Back  Device Skin Pressure Protection: absorbent pad utilized/changed  Skin Protection: adhesive use limited  Intervention: Prevent and Manage VTE (Venous Thromboembolism) Risk  Recent Flowsheet Documentation  Taken 10/24/2022 2027 by Candie Chroman, RN  VTE Prevention/Management: ambulation promoted  Intervention: Prevent Infection  Recent Flowsheet Documentation  Taken 10/24/2022 1931 by Candie Chroman, RN  Infection Prevention: hand hygiene promoted  Goal: Optimal Comfort and Wellbeing  Outcome: Progressing  Goal: Readiness for Transition of Care  Outcome: Progressing  Goal: Rounds/Family Conference  Outcome: Progressing     Problem: Infection  Goal: Absence of Infection Signs and Symptoms  Outcome: Progressing  Intervention: Prevent or Manage Infection  Recent Flowsheet Documentation  Taken 10/24/2022 1931 by Candie Chroman, RN  Infection Management: aseptic technique maintained     Problem: Fall Injury Risk  Goal: Absence of Fall and Fall-Related Injury  Outcome: Progressing  Intervention: Promote Injury-Free Environment  Recent Flowsheet Documentation  Taken 10/24/2022 1931 by Candie Chroman, RN  Safety Interventions:   low bed   lighting adjusted for tasks/safety   fall reduction program maintained     Problem: Self-Care Deficit  Goal: Improved Ability to Complete Activities of Daily Living  Outcome: Progressing     Problem: Comorbidity Management  Goal: Maintenance of Asthma Control  Outcome: Progressing  Goal: Maintenance of Behavioral Health Symptom Control  Outcome: Progressing  Goal: Maintenance of COPD Symptom Control  Outcome: Progressing  Goal: Blood Glucose Levels Within Targeted Range  Outcome: Progressing  Goal: Maintenance of Heart Failure Symptom Control  Outcome: Progressing  Goal: Blood Pressure in Desired Range  Outcome: Progressing  Goal: Maintenance of Osteoarthritis Symptom Control  Outcome: Progressing  Goal: Bariatric Home Regimen Maintained  Outcome: Progressing  Goal: Maintenance of Seizure Control  Outcome: Progressing     Problem: Skin Injury Risk Increased  Goal: Skin Health and Integrity  Outcome: Progressing  Intervention: Optimize Skin Protection  Recent Flowsheet Documentation  Taken 10/24/2022 1931 by Candie Chroman, RN  Pressure Reduction Techniques: frequent weight shift encouraged  Pressure Reduction Devices: positioning supports utilized  Skin Protection: adhesive use limited     Problem: Wound  Goal: Optimal Coping  Outcome: Progressing  Goal: Optimal Functional Ability  Outcome: Progressing  Goal: Absence of Infection Signs and Symptoms  Outcome: Progressing  Intervention: Prevent or Manage Infection  Recent Flowsheet Documentation  Taken 10/24/2022 1931 by Candie Chroman, RN  Infection Management: aseptic technique maintained  Goal: Improved Oral Intake  Outcome: Progressing  Goal: Optimal Pain Control and Function  Outcome: Progressing  Goal: Skin Health and Integrity  Outcome: Progressing  Intervention: Optimize Skin Protection  Recent Flowsheet Documentation  Taken 10/24/2022 1931 by Candie Chroman, RN  Pressure Reduction Techniques: frequent weight shift encouraged  Pressure Reduction Devices: positioning supports utilized  Skin Protection: adhesive use limited  Goal: Optimal Wound Healing  Outcome: Progressing     Problem: Malnutrition  Goal: Improved Nutritional Intake  Outcome: Progressing

## 2022-10-25 NOTE — Unmapped (Addendum)
The below services have been ordered for you by your medical team to help your health and safety at home.    If Home Health services have been arranged, the agency listed below will be contacting you to set up a time for them to come see you in your home within 2 days of your discharge.  If you have not heard from them prior to 10/27/22 or you have any questions about home health, please contact them at the phone number listed below.    [facility] - Admitted Since 10/05/2022       Service Provider Selected Services Address Phone Fax Patient Preferred    [facility] ,  Home Rehabilitation ,  [facility] [address] [place] [address] [phone] --          Start of Care: w/in 48 hours of DC    Please contact your Post-Transplant Coordinator if you have any problems/concerns:  Celene Squibb [phone]; [phone]

## 2022-10-25 NOTE — Unmapped (Signed)
Physician Discharge Summary [facility]  3 [facility]  [address]  [place] [address]  Dept: [phone]  Loc: [phone]     Identifying Information:   Akelia Laughton  [birth year]  [id number]    Primary Care Physician: Barbette Reichmann, MD     Code Status: Full Code    Admit Date: 10/05/2022    Discharge Date: 10/25/2022     Discharge To: Home with Home Health and/or PT/OT    Discharge Service: [facility] - Nephrology Floor Team (MED B - Tower)     Discharge Attending Physician: Sharlett Iles, MD    Discharge Diagnoses:   Principal Problem (Resolved):    E coli bacteremia (POA: Yes)  Active Problems:    Type 2 diabetes mellitus with diabetic nephropathy, with long-term current use of insulin (CMS-HCC) (POA: Not Applicable)    End-stage renal disease (CMS-HCC) (POA: Yes)    Hypothyroidism, acquired (POA: Yes)    Status post exploratory laparotomy (POA: Not Applicable)    Volvulus (CMS-HCC) (POA: Unknown)  Resolved Problems:    Small bowel obstruction (CMS-HCC) (POA: Unknown)    Hospital Course:   Malcolm Agro is a 72 y.o. female with complicated previous medical history status post DCD DDKT in 07/2022, hypertension, type 2 diabetes, hypothyroidism, who was admitted for GNR bacteremia and was treated with meropenem until 4/1. She is s/p exploratory laparotomy and reduction and small bowel volvulus on 10/13/22. She was transferred to nephrology service for titration of tacrolimus in setting of supra therapeutic level. Discharged with home health on 4/3.     ESBL/FQ-R E. coli bacteremia (resolved)  Presented with sepsis, blood and urine cultures grew MDR E Coli. She is s/p DDKT in Jan 2024. Imaging showed perinephric collection new from prior (she had prior JP drain). Underwent IR guided fluid collection. Finished course of meropenem 3/15-4/1 via non-tunneled RIJ line. Followed by ICID while inpatient.   - Referred to urogyn for recurrent UTI    SBO s/p ex-lap  She is s/p exploratory laparotomy and reduction and small bowel volvulus on 10/13/22. NGT removed 3/24, having BM and normal PO.    S/p DDKT 08/06/22   Follows with Dr. Elvera Maria. 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 DCD, KDPI 56%, Basiliximab induction, CMV +/+, EBV +/+, standard anatomy. Post kidney transplant course has been notable for: E coli UTI.  Renal US 3/14 showed small new perinephric collection which may represent hematoma, complicated seroma, or abscess. Stable resistive indices in the renal transplant arteries, which are mildly elevated. Tac lvl previously subtherapeutic, now in appropriate range. Meds: Tacrolimus 1mg  AM + 0.5mg  PM, Cellcept 500mg  BID, Pred 5 daily, Prophylaxis: bactrim and valcyte daily. Cont daily aspirin and atorvastatin.      HTN  Hypertensive while inpatient with systolics 140s-150s on discharge. Was on clonidine 0.1 mg qPM during her hospital stay, discontinued on 3/25. Discontinued spiro in setting of hyperkalemai. Discharged on coreg 25mg  bid, amlo 5mg  bid, hydral 25mg  TID, chlorthalidone 25mg  daily.     Hyperkalemia  Peaked to 6. Improved to 5.3 day of discharge with lokelma and restart chlorthalidone.   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 Did receive unit PRBC on 3.19. Stable at discharge.  T2DM  Last A1C 5.6. Home regimen was Lantus 12 units nightly, lispro 3 units with meals, with sliding scale.  Lower needs at discharge, sent home on lantus 6u nightly without sliding scale (was only requiring 1-2u daily and glucoses have downtrended with treating the infection).      C/f thrush, candida esophagitis  Has had a sore throat and pain with swallowing since NGT insertion. Has white coating on tongue. Is at risk for candida given immunosuppression. IV micafungin 3/19 and transitioned to fluconazole on 3/26-4/3.     APS report filed and pending  Per social work and transplant team. Daughters initiating guardianship application. APS report is pending. Case will be continued to be followed with transplant case manager Lowella Petties. DC to home w/ spouse; resume HH; [place] [facility] remain involved and will complete their assessment post dc.      Chronic Problems  Hypothyroidism: Continue Synthroid. Repeat TSH wnl.   HLD- atorva 10 daily  Pain control- prn methocarbamol  GERD- PPI      The patient's hospital stay has been complicated by the following clinically significant conditions requiring additional evaluation and treatment or having a significant effect of this patient's care: - Thrombocytopenia POA requiring further investigation or monitor  - Anemia POA requiring further investigation or monitoring  - Chronic kidney disease POA requiring further investigation, treatment, or monitoring  - Hyperkalemia POA requiring further investigation, treatment, or monitoring  - Acidosis POA requiring further investigation, treatment, or monitoring  - Dehydration POA requiring further investigation, treatment, or monitoring  - Complex social situation/SDOH requiring consultation and support of Care Management      Nutrition Assessment:   Non-severe (Moderate) Protein-Calorie Malnutrition in the context of acute illness or injury (10/20/22 1119)  Energy Intake: < or equal to 50% of estimated energy requirement for > or equal to 5 days  Interpretation of Wt. Loss: > or equal to 5% x 1 month  Malnutrition Score: 2    Outpatient Provider Follow Up Issues:   Hyperkalemia- please monitor and restart lokelma if needed    Touchbase with Outpatient Provider:  Warm Handoff: Completed on 10/25/22 by Devota Pace, MD  (Resident) via [facility] Message    Procedures:  Ex-lap  Central line placement  NGT placement  ______________________________________________________________________  Discharge Medications:      Your Medication List        STOP taking these medications      acetaminophen 500 MG tablet  Commonly known as: TYLENOL     cloNIDine 0.3 mg/24 hr  Commonly known as: CATAPRES-TTS     insulin aspart 100 unit/mL (3 mL) injection pen  Commonly known as: NovoLOG Flexpen U-100 Insulin     nystatin 100,000 unit/mL suspension  Commonly known as: MYCOSTATIN     spironolactone 25 MG tablet  Commonly known as: ALDACTONE            START taking these medications      fluconazole 200 MG tablet  Commonly known as: DIFLUCAN  Take 2 tablets (400 mg total) by mouth daily for 5 days.  Start taking on: October 26, 2022     melatonin 3 mg Tab  Take 2 tablets (6 mg total) by mouth every evening.     methocarbamol 500 MG tablet  Commonly known as: ROBAXIN  Take 1 tablet (500 mg total) by mouth four (4) times a day as needed (pain/cramping).            CHANGE how you take  these medications      amlodipine 5 MG tablet  Commonly known as: NORVASC  Take 1 tablet (5 mg total) by mouth every twelve (12) hours.  What changed:   medication strength  how much to take  when to take this     calcium carbonate 200 mg calcium (500 mg) chewable tablet  Commonly known as: TUMS  Chew 1 tablet (200 mg of elem calcium total) Three (3) times a day as needed.  What changed:   how much to take  when to take this  reasons to take this     hydrALAZINE 50 MG tablet  Commonly known as: APRESOLINE  Take 1 tablet (50 mg total) by mouth two (2) times a day.  What changed:   medication strength  how much to take  when to take this     insulin glargine 100 unit/mL (3 mL) injection pen  Commonly known as: BASAGLAR, LANTUS  Inject 0.06 mL (6 Units total) under the skin nightly.  What changed: how much to take     magnesium oxide-Mg AA chelate 133 mg  Commonly known as: Magnesium Plus Protein  Take 2 tablets by mouth two (2) times a day.  What changed:   how much to take  when to take this  additional instructions     tacrolimus 0.5 MG capsule  Commonly known as: PROGRAF  Take 2 capsules (1 mg total) by mouth daily AND 1 capsule (0.5 mg total) nightly.  What changed:   medication strength  See the new instructions.     valGANciclovir 450 mg tablet  Commonly known as: VALCYTE  Take 1 tablet (450 mg total) by mouth daily.  What changed: when to take this            CONTINUE taking these medications      ACCU-CHEK AVIVA PLUS TEST STRP Strp  Generic drug: blood sugar diagnostic  USE 1 STRIP TO CHECK GLUCOSE THREE TIMES DAILY     blood sugar diagnostic Strp  USE 1 STRIP TO CHECK GLUCOSE THREE TIMES DAILY     ACCU-CHEK GUIDE TEST STRIPS Strp  Generic drug: blood sugar diagnostic  Use to check blood sugar as directed with insulin 3 times a day & for symptoms of high or low blood sugar.     ACCU-CHEK SOFTCLIX LANCETS lancets  Generic drug: lancets  Use to check blood sugar as directed with insulin 3 times a day & for symptoms of high or low blood sugar.     aspirin 81 MG tablet  Commonly known as: ECOTRIN  Take 1 tablet (81 mg total) by mouth daily.     atorvastatin 10 MG tablet  Commonly known as: LIPITOR  Take 1 tablet (10 mg total) by mouth daily.     BD ULTRA-FINE NANO PEN NEEDLE 32 gauge x 5/32 (4 mm) Ndle  Generic drug: pen needle, diabetic  Use with insulin 2 times daily     BD ULTRA-FINE NANO PEN NEEDLE 32 gauge x 5/32 (4 mm) Ndle  Generic drug: pen needle, diabetic  Use with insulin up to 4 times/day as needed.     blood-glucose meter kit  Use as directed. ACCU-CHEK AVIVA     ACCU-CHEK GUIDE GLUCOSE METER Misc  Generic drug: blood-glucose meter  Use as instructed     carvedilol 25 MG tablet  Commonly known as: COREG  Take 1 tablet (25 mg total) by mouth two (2) times a day.     chlorthalidone  25 MG tablet  Commonly known as: HYGROTON  Take 1 tablet (25 mg total) by mouth daily.     DEXCOM G7 SENSOR Devi  Generic drug: blood-glucose sensor  Use 1 sensor every 10 days. docusate sodium 100 MG capsule  Commonly known as: COLACE  Take 1 capsule (100 mg total) by mouth two (2) times a day as needed for constipation.     levothyroxine 112 MCG tablet  Commonly known as: SYNTHROID  Take 1 tablet (112 mcg total) by mouth daily.     mycophenolate 250 mg capsule  Commonly known as: CELLCEPT  Take 2 capsules (500 mg total) by mouth two (2) times a day.     pantoprazole 40 MG tablet  Commonly known as: Protonix  Take 1 tablet (40 mg total) by mouth daily.     polyethylene glycol 17 gram/dose powder  Commonly known as: GLYCOLAX  Take 17 g by mouth daily as needed.     predniSONE 5 MG tablet  Commonly known as: DELTASONE  Take 1 tablet (5 mg total) by mouth in the morning.     SENNA LAXATIVE 8.6 mg tablet  Generic drug: senna  Take 1 tablet by mouth daily as needed for constipation.     sulfamethoxazole-trimethoprim 400-80 mg per tablet  Commonly known as: BACTRIM  Take 1 tablet (80 mg of trimethoprim total) by mouth Every Monday, Wednesday, and Friday.              Allergies:  Oxycodone, Gabapentin, and Valacyclovir  ______________________________________________________________________  Pending Test Results:  Pending Labs       Order Current Status    Freeze for Micro In process    AFB culture Preliminary result            Most Recent Labs:  BMP - Results in Past 2 Days  Result Component Current Result   Sodium 141 (10/25/2022)   Potassium 5.3 (H) (10/25/2022)   Chloride 111 (H) (10/25/2022)   CO2 24.0 (10/25/2022)   BUN 18 (10/25/2022)   Creatinine 0.94 (10/25/2022)   EST.GFR (MDRD) Not in Time Range   Glucose 94 (10/25/2022)       Relevant Studies/Radiology:    ______________________________________________________________________  Discharge Instructions:   Activity Instructions       Activity as tolerated                  Resources and Referrals    The below services have been ordered for you by your medical team to help your health and safety at home.    If Home Health services have been arranged, the agency listed below will be contacting you to set up a time for them to come see you in your home within 2 days of your discharge.  If you have not heard from them prior to 10/27/22 or you have any questions about home health, please contact them at the phone number listed below.    [facility] - Admitted Since 10/05/2022       Service Provider Selected Services Address Phone Fax Patient Preferred    [facility] ,  Home Rehabilitation ,  [facility] [address] [place] [address] [phone] --          Start of Care: w/in 48 hours of DC    Please contact your Post-Transplant Coordinator if you have any problems/concerns:  Celene Squibb [phone]; [phone]  Follow Up instructions and Outpatient Referrals     Ambulatory referral to Home Health      Is this a [facility] or [facility] Patient?: No    Physician to follow patient's care: Referring Provider    Disciplines requested:  Nursing  Physical Therapy  Occupational Therapy  Home Health Aide       Nursing requested: Other: (please enter in comments)    Physical Therapy requested: Evaluate and treat    Occupational Therapy Requested: Evaluate and treat    Requested SOC Date:  Comment - w/in 48 hrs of DC    Ambulatory referral to Urogynecology      Ambulatory referral to Nutrition Services      Do you want ongoing co-management?: No    Care coordination required?: No    Ambulatory referral to Home Health      Is this a [facility] or [facility] Patient?: No    Physician to follow patient's care: Referring Provider    Disciplines requested:  Nursing  Physical Therapy  Occupational Therapy  Home Health Aide       Nursing requested: Other: (please enter in comments)    Physical Therapy requested:  Home safety evaluation  Evaluate and treat       Occupational Therapy Requested:  Home safety evaluation  Evaluate and treat Requested SOC Date:  Comment - w/in 48 hours of DC    Call MD for:  difficulty breathing, headache or visual disturbances      Call MD for:  persistent nausea or vomiting      Call MD for:  severe uncontrolled pain      Call MD for:  temperature >38.5 Celsius      Discharge instructions          Appointments which have been scheduled for you      Nov 20, 2022 12:00 PM  (Arrive by 11:30 AM)  FOLLOW UP FOR A GENERAL INFECTIOUS DISEASE VISIT with Shiela Mayer, MD  [facility]IOUS DISEAS[facility] [place]) [address]  [place] [address]  [phone]        Dec 07, 2022  8:30 AM  (Arrive by 8:15 AM)  RETURN NEPHROLOGY POST with Leafy Half, MD  [facility] [place]) [address]  [place] [address]  [place] [address]  [phone]             ______________________________________________________________________  Discharge Day Services:  BP 140/40  - Pulse 64  - Temp 37 ??C (98.6 ??F) (Oral)  - Resp 20  - Ht 147.3 cm (4' 9.99)  - Wt 56.7 kg (124 lb 14.3 oz)  - SpO2 96%  - BMI 26.11 kg/m??     Pt seen on the day of discharge and determined appropriate for discharge.    Condition at Discharge: good    Length of Discharge: I spent greater than 30 mins in the discharge of this patient.

## 2022-10-26 ENCOUNTER — Telehealth: Payer: Self-pay

## 2022-10-26 MED ORDER — VALGANCICLOVIR 450 MG TABLET
ORAL_TABLET | Freq: Every day | ORAL | 0 refills | 90 days | Status: CN
Start: 2022-10-26 — End: ?

## 2022-10-26 MED ORDER — FLUCONAZOLE 200 MG TABLET
ORAL_TABLET | Freq: Every day | ORAL | 0 refills | 5 days | Status: CP
Start: 2022-10-26 — End: 2022-10-31

## 2022-10-26 MED ORDER — LEVOTHYROXINE 112 MCG TABLET
ORAL_TABLET | Freq: Every day | ORAL | 0 refills | 30 days | Status: CN
Start: 2022-10-26 — End: 2022-11-25

## 2022-10-26 NOTE — Telephone Encounter (Signed)
Palliative care outreach to patient/daughter, Bethany Lee, to discuss new PC referral. New PC criteria reviewed and discussed. Daughter declined PC services at this time.   PC referral will be closed.

## 2022-10-27 MED ORDER — SULFAMETHOXAZOLE 400 MG-TRIMETHOPRIM 80 MG TABLET
ORAL_TABLET | ORAL | 0 refills | 210 days | Status: CN
Start: 2022-10-27 — End: ?

## 2022-10-30 DIAGNOSIS — Z940 Kidney transplant status: Secondary | ICD-10-CM | POA: Diagnosis not present

## 2022-10-30 LAB — RENAL FUNCTION PANEL
ALBUMIN: 4 g/dL (ref 3.8–4.8)
BLOOD UREA NITROGEN: 34 mg/dL — ABNORMAL HIGH (ref 8–27)
BUN / CREAT RATIO: 25 (ref 12–28)
CALCIUM: 9.4 mg/dL (ref 8.7–10.3)
CHLORIDE: 107 mmol/L — ABNORMAL HIGH (ref 96–106)
CO2: 17 mmol/L — ABNORMAL LOW (ref 20–29)
CREATININE: 1.38 mg/dL — ABNORMAL HIGH (ref 0.57–1.00)
EGFR: 40 mL/min/{1.73_m2} — ABNORMAL LOW
GLUCOSE: 158 mg/dL — ABNORMAL HIGH (ref 70–99)
PHOSPHORUS, SERUM: 3.7 mg/dL (ref 3.0–4.3)
POTASSIUM: 5.6 mmol/L — ABNORMAL HIGH (ref 3.5–5.2)
SODIUM: 140 mmol/L (ref 134–144)

## 2022-10-30 LAB — MAGNESIUM: MAGNESIUM: 2 mg/dL (ref 1.6–2.3)

## 2022-10-31 DIAGNOSIS — Z48815 Encounter for surgical aftercare following surgery on the digestive system: Secondary | ICD-10-CM | POA: Diagnosis not present

## 2022-10-31 DIAGNOSIS — D631 Anemia in chronic kidney disease: Secondary | ICD-10-CM | POA: Diagnosis not present

## 2022-10-31 DIAGNOSIS — N390 Urinary tract infection, site not specified: Secondary | ICD-10-CM | POA: Diagnosis not present

## 2022-10-31 DIAGNOSIS — I120 Hypertensive chronic kidney disease with stage 5 chronic kidney disease or end stage renal disease: Secondary | ICD-10-CM | POA: Diagnosis not present

## 2022-10-31 DIAGNOSIS — N186 End stage renal disease: Secondary | ICD-10-CM | POA: Diagnosis not present

## 2022-10-31 DIAGNOSIS — D849 Immunodeficiency, unspecified: Secondary | ICD-10-CM | POA: Diagnosis not present

## 2022-10-31 DIAGNOSIS — B9620 Unspecified Escherichia coli [E. coli] as the cause of diseases classified elsewhere: Secondary | ICD-10-CM | POA: Diagnosis not present

## 2022-10-31 DIAGNOSIS — A419 Sepsis, unspecified organism: Secondary | ICD-10-CM | POA: Diagnosis not present

## 2022-10-31 DIAGNOSIS — E1122 Type 2 diabetes mellitus with diabetic chronic kidney disease: Secondary | ICD-10-CM | POA: Diagnosis not present

## 2022-10-31 LAB — CBC W/ DIFFERENTIAL
BANDED NEUTROPHILS ABSOLUTE COUNT: 0.1 10*3/uL (ref 0.0–0.1)
BASOPHILS ABSOLUTE COUNT: 0 10*3/uL (ref 0.0–0.2)
BASOPHILS RELATIVE PERCENT: 1 %
EOSINOPHILS ABSOLUTE COUNT: 0.1 10*3/uL (ref 0.0–0.4)
EOSINOPHILS RELATIVE PERCENT: 2 %
HEMATOCRIT: 28.3 % — ABNORMAL LOW (ref 34.0–46.6)
HEMOGLOBIN: 9.3 g/dL — ABNORMAL LOW (ref 11.1–15.9)
IMMATURE GRANULOCYTES: 1 %
LYMPHOCYTES ABSOLUTE COUNT: 0.9 10*3/uL (ref 0.7–3.1)
LYMPHOCYTES RELATIVE PERCENT: 21 %
MEAN CORPUSCULAR HEMOGLOBIN CONC: 32.9 g/dL (ref 31.5–35.7)
MEAN CORPUSCULAR HEMOGLOBIN: 32.9 pg (ref 26.6–33.0)
MEAN CORPUSCULAR VOLUME: 100 fL — ABNORMAL HIGH (ref 79–97)
MONOCYTES ABSOLUTE COUNT: 0.3 10*3/uL (ref 0.1–0.9)
MONOCYTES RELATIVE PERCENT: 8 %
NEUTROPHILS ABSOLUTE COUNT: 2.9 10*3/uL (ref 1.4–7.0)
NEUTROPHILS RELATIVE PERCENT: 67 %
PLATELET COUNT: 134 10*3/uL — ABNORMAL LOW (ref 150–450)
RED BLOOD CELL COUNT: 2.83 x10E6/uL — ABNORMAL LOW (ref 3.77–5.28)
RED CELL DISTRIBUTION WIDTH: 14.4 % (ref 11.7–15.4)
WHITE BLOOD CELL COUNT: 4.4 10*3/uL (ref 3.4–10.8)

## 2022-10-31 LAB — TACROLIMUS LEVEL: TACROLIMUS BLOOD: 7.3 ng/mL (ref 2.0–20.0)

## 2022-11-01 DIAGNOSIS — E875 Hyperkalemia: Secondary | ICD-10-CM | POA: Diagnosis not present

## 2022-11-01 DIAGNOSIS — H35371 Puckering of macula, right eye: Secondary | ICD-10-CM | POA: Diagnosis not present

## 2022-11-01 DIAGNOSIS — E1142 Type 2 diabetes mellitus with diabetic polyneuropathy: Secondary | ICD-10-CM | POA: Diagnosis not present

## 2022-11-01 DIAGNOSIS — I5022 Chronic systolic (congestive) heart failure: Secondary | ICD-10-CM | POA: Diagnosis not present

## 2022-11-01 DIAGNOSIS — Z961 Presence of intraocular lens: Secondary | ICD-10-CM | POA: Diagnosis not present

## 2022-11-01 DIAGNOSIS — Z09 Encounter for follow-up examination after completed treatment for conditions other than malignant neoplasm: Secondary | ICD-10-CM | POA: Diagnosis not present

## 2022-11-01 DIAGNOSIS — I110 Hypertensive heart disease with heart failure: Secondary | ICD-10-CM | POA: Diagnosis not present

## 2022-11-01 DIAGNOSIS — I2510 Atherosclerotic heart disease of native coronary artery without angina pectoris: Secondary | ICD-10-CM | POA: Diagnosis not present

## 2022-11-01 DIAGNOSIS — Z940 Kidney transplant status: Secondary | ICD-10-CM | POA: Diagnosis not present

## 2022-11-01 DIAGNOSIS — E785 Hyperlipidemia, unspecified: Secondary | ICD-10-CM | POA: Diagnosis not present

## 2022-11-01 DIAGNOSIS — D649 Anemia, unspecified: Secondary | ICD-10-CM | POA: Diagnosis not present

## 2022-11-01 DIAGNOSIS — Z9889 Other specified postprocedural states: Secondary | ICD-10-CM | POA: Diagnosis not present

## 2022-11-01 DIAGNOSIS — E113493 Type 2 diabetes mellitus with severe nonproliferative diabetic retinopathy without macular edema, bilateral: Secondary | ICD-10-CM | POA: Diagnosis not present

## 2022-11-01 MED ORDER — PREDNISONE 5 MG TABLET
ORAL_TABLET | Freq: Every day | ORAL | 3 refills | 90 days | Status: CP
Start: 2022-11-01 — End: ?

## 2022-11-02 DIAGNOSIS — D631 Anemia in chronic kidney disease: Secondary | ICD-10-CM | POA: Diagnosis not present

## 2022-11-02 DIAGNOSIS — D849 Immunodeficiency, unspecified: Secondary | ICD-10-CM | POA: Diagnosis not present

## 2022-11-02 DIAGNOSIS — B9620 Unspecified Escherichia coli [E. coli] as the cause of diseases classified elsewhere: Secondary | ICD-10-CM | POA: Diagnosis not present

## 2022-11-02 DIAGNOSIS — I120 Hypertensive chronic kidney disease with stage 5 chronic kidney disease or end stage renal disease: Secondary | ICD-10-CM | POA: Diagnosis not present

## 2022-11-02 DIAGNOSIS — E1122 Type 2 diabetes mellitus with diabetic chronic kidney disease: Secondary | ICD-10-CM | POA: Diagnosis not present

## 2022-11-02 DIAGNOSIS — N390 Urinary tract infection, site not specified: Secondary | ICD-10-CM | POA: Diagnosis not present

## 2022-11-02 DIAGNOSIS — Z48815 Encounter for surgical aftercare following surgery on the digestive system: Secondary | ICD-10-CM | POA: Diagnosis not present

## 2022-11-02 DIAGNOSIS — A419 Sepsis, unspecified organism: Secondary | ICD-10-CM | POA: Diagnosis not present

## 2022-11-02 DIAGNOSIS — N186 End stage renal disease: Secondary | ICD-10-CM | POA: Diagnosis not present

## 2022-11-02 NOTE — Unmapped (Signed)
Patients daughter called reporting pt having elevated BG levels since being home with taking lantus 6 units and no SSI.  Reviewed with Knox Saliva PharmD, pt advised to resume her regime of 12 units of lantus plus SSI.  Pts daughter also advised to have pt get labs tomorrow for a tac level as she completed her fluconazole on 4/8.  Detailed VM left of the above and pts daughter Lorene Dy asked to call this TNC to confirm and with any questions.

## 2022-11-03 DIAGNOSIS — N390 Urinary tract infection, site not specified: Secondary | ICD-10-CM | POA: Diagnosis not present

## 2022-11-03 DIAGNOSIS — D849 Immunodeficiency, unspecified: Secondary | ICD-10-CM | POA: Diagnosis not present

## 2022-11-03 DIAGNOSIS — A419 Sepsis, unspecified organism: Secondary | ICD-10-CM | POA: Diagnosis not present

## 2022-11-03 DIAGNOSIS — Z940 Kidney transplant status: Secondary | ICD-10-CM | POA: Diagnosis not present

## 2022-11-03 DIAGNOSIS — D631 Anemia in chronic kidney disease: Secondary | ICD-10-CM | POA: Diagnosis not present

## 2022-11-03 DIAGNOSIS — N186 End stage renal disease: Secondary | ICD-10-CM | POA: Diagnosis not present

## 2022-11-03 DIAGNOSIS — Z48815 Encounter for surgical aftercare following surgery on the digestive system: Secondary | ICD-10-CM | POA: Diagnosis not present

## 2022-11-03 DIAGNOSIS — I120 Hypertensive chronic kidney disease with stage 5 chronic kidney disease or end stage renal disease: Secondary | ICD-10-CM | POA: Diagnosis not present

## 2022-11-03 DIAGNOSIS — E1122 Type 2 diabetes mellitus with diabetic chronic kidney disease: Secondary | ICD-10-CM | POA: Diagnosis not present

## 2022-11-03 DIAGNOSIS — B9620 Unspecified Escherichia coli [E. coli] as the cause of diseases classified elsewhere: Secondary | ICD-10-CM | POA: Diagnosis not present

## 2022-11-03 LAB — RENAL FUNCTION PANEL
ALBUMIN: 4 g/dL (ref 3.8–4.8)
BLOOD UREA NITROGEN: 35 mg/dL — ABNORMAL HIGH (ref 8–27)
BUN / CREAT RATIO: 26 (ref 12–28)
CALCIUM: 9.2 mg/dL (ref 8.7–10.3)
CHLORIDE: 110 mmol/L — ABNORMAL HIGH (ref 96–106)
CO2: 17 mmol/L — ABNORMAL LOW (ref 20–29)
CREATININE: 1.34 mg/dL — ABNORMAL HIGH (ref 0.57–1.00)
EGFR: 42 mL/min/{1.73_m2} — ABNORMAL LOW
GLUCOSE: 210 mg/dL — ABNORMAL HIGH (ref 70–99)
PHOSPHORUS, SERUM: 2.4 mg/dL — ABNORMAL LOW (ref 3.0–4.3)
POTASSIUM: 5.8 mmol/L — ABNORMAL HIGH (ref 3.5–5.2)
SODIUM: 137 mmol/L (ref 134–144)

## 2022-11-03 LAB — MAGNESIUM: MAGNESIUM: 1.9 mg/dL (ref 1.6–2.3)

## 2022-11-03 MED ORDER — PREDNISONE 5 MG TABLET
ORAL_TABLET | Freq: Every day | ORAL | 3 refills | 90 days | Status: CP
Start: 2022-11-03 — End: ?

## 2022-11-03 NOTE — Unmapped (Signed)
Received a VM from pts spouse that she went to her PCP and he wanted to start her on a vitamin B12 supplement, Vitamin D2 and lokelma for potassium 5.8  Reviewed with Knox Saliva, advised pts spouse that it is ok to start vitamin B12 supplement.  Pts Vitamin D level was normal last month in March but a daily supplement of Vitamin d2 id also ok.  Pts spouse was not sure of the dosage of lokelma but said she was given a bottle of liquid to take just for today.  Pts spouse advised to call this TNC back to confirm  dosage and ok to take one dose today but to let pts PCP know that we will manage her potassium levels and labs with her kidney function.  Pts spouse verbalized understanding.

## 2022-11-04 ENCOUNTER — Ambulatory Visit: Admit: 2022-11-04 | Payer: MEDICARE

## 2022-11-04 ENCOUNTER — Ambulatory Visit
Admit: 2022-11-04 | Discharge: 2022-11-14 | Disposition: A | Discharge status: Home Health | Payer: MEDICARE | Admitting: Internal Medicine

## 2022-11-04 DIAGNOSIS — R197 Diarrhea, unspecified: Secondary | ICD-10-CM | POA: Diagnosis not present

## 2022-11-04 DIAGNOSIS — B9689 Other specified bacterial agents as the cause of diseases classified elsewhere: Secondary | ICD-10-CM | POA: Diagnosis not present

## 2022-11-04 DIAGNOSIS — E1122 Type 2 diabetes mellitus with diabetic chronic kidney disease: Secondary | ICD-10-CM | POA: Diagnosis not present

## 2022-11-04 DIAGNOSIS — N390 Urinary tract infection, site not specified: Secondary | ICD-10-CM | POA: Diagnosis not present

## 2022-11-04 DIAGNOSIS — R410 Disorientation, unspecified: Secondary | ICD-10-CM | POA: Diagnosis not present

## 2022-11-04 DIAGNOSIS — D849 Immunodeficiency, unspecified: Secondary | ICD-10-CM | POA: Diagnosis not present

## 2022-11-04 DIAGNOSIS — Z79621 Long term (current) use of calcineurin inhibitor: Secondary | ICD-10-CM | POA: Diagnosis not present

## 2022-11-04 DIAGNOSIS — D84821 Immunodeficiency due to drugs: Secondary | ICD-10-CM | POA: Diagnosis not present

## 2022-11-04 DIAGNOSIS — R0989 Other specified symptoms and signs involving the circulatory and respiratory systems: Secondary | ICD-10-CM | POA: Diagnosis not present

## 2022-11-04 DIAGNOSIS — R1110 Vomiting, unspecified: Secondary | ICD-10-CM | POA: Diagnosis not present

## 2022-11-04 DIAGNOSIS — J984 Other disorders of lung: Secondary | ICD-10-CM | POA: Diagnosis not present

## 2022-11-04 DIAGNOSIS — N3001 Acute cystitis with hematuria: Secondary | ICD-10-CM | POA: Diagnosis not present

## 2022-11-04 DIAGNOSIS — I120 Hypertensive chronic kidney disease with stage 5 chronic kidney disease or end stage renal disease: Secondary | ICD-10-CM | POA: Diagnosis not present

## 2022-11-04 DIAGNOSIS — R5383 Other fatigue: Secondary | ICD-10-CM | POA: Diagnosis not present

## 2022-11-04 DIAGNOSIS — B9629 Other Escherichia coli [E. coli] as the cause of diseases classified elsewhere: Secondary | ICD-10-CM | POA: Diagnosis not present

## 2022-11-04 DIAGNOSIS — R4182 Altered mental status, unspecified: Secondary | ICD-10-CM | POA: Diagnosis not present

## 2022-11-04 DIAGNOSIS — A4151 Sepsis due to Escherichia coli [E. coli]: Secondary | ICD-10-CM | POA: Diagnosis not present

## 2022-11-04 DIAGNOSIS — Z940 Kidney transplant status: Secondary | ICD-10-CM | POA: Diagnosis not present

## 2022-11-04 DIAGNOSIS — D696 Thrombocytopenia, unspecified: Secondary | ICD-10-CM | POA: Diagnosis not present

## 2022-11-04 DIAGNOSIS — B9620 Unspecified Escherichia coli [E. coli] as the cause of diseases classified elsewhere: Secondary | ICD-10-CM | POA: Diagnosis not present

## 2022-11-04 DIAGNOSIS — R188 Other ascites: Secondary | ICD-10-CM | POA: Diagnosis not present

## 2022-11-04 DIAGNOSIS — Z1612 Extended spectrum beta lactamase (ESBL) resistance: Secondary | ICD-10-CM | POA: Diagnosis not present

## 2022-11-04 DIAGNOSIS — D631 Anemia in chronic kidney disease: Secondary | ICD-10-CM | POA: Diagnosis not present

## 2022-11-04 DIAGNOSIS — A041 Enterotoxigenic Escherichia coli infection: Secondary | ICD-10-CM | POA: Diagnosis not present

## 2022-11-04 DIAGNOSIS — N186 End stage renal disease: Secondary | ICD-10-CM | POA: Diagnosis not present

## 2022-11-04 DIAGNOSIS — B379 Candidiasis, unspecified: Secondary | ICD-10-CM | POA: Diagnosis not present

## 2022-11-04 DIAGNOSIS — N189 Chronic kidney disease, unspecified: Secondary | ICD-10-CM | POA: Diagnosis not present

## 2022-11-04 DIAGNOSIS — Z20822 Contact with and (suspected) exposure to covid-19: Secondary | ICD-10-CM | POA: Diagnosis not present

## 2022-11-04 DIAGNOSIS — A419 Sepsis, unspecified organism: Secondary | ICD-10-CM | POA: Diagnosis not present

## 2022-11-04 DIAGNOSIS — Z48815 Encounter for surgical aftercare following surgery on the digestive system: Secondary | ICD-10-CM | POA: Diagnosis not present

## 2022-11-04 DIAGNOSIS — K56609 Unspecified intestinal obstruction, unspecified as to partial versus complete obstruction: Secondary | ICD-10-CM | POA: Diagnosis not present

## 2022-11-04 DIAGNOSIS — N179 Acute kidney failure, unspecified: Secondary | ICD-10-CM | POA: Diagnosis not present

## 2022-11-04 DIAGNOSIS — I10 Essential (primary) hypertension: Secondary | ICD-10-CM | POA: Diagnosis not present

## 2022-11-04 DIAGNOSIS — R918 Other nonspecific abnormal finding of lung field: Secondary | ICD-10-CM | POA: Diagnosis not present

## 2022-11-04 DIAGNOSIS — R7881 Bacteremia: Secondary | ICD-10-CM | POA: Diagnosis not present

## 2022-11-04 DIAGNOSIS — G928 Other toxic encephalopathy: Secondary | ICD-10-CM | POA: Diagnosis not present

## 2022-11-04 DIAGNOSIS — Z452 Encounter for adjustment and management of vascular access device: Secondary | ICD-10-CM | POA: Diagnosis not present

## 2022-11-04 DIAGNOSIS — D649 Anemia, unspecified: Secondary | ICD-10-CM | POA: Diagnosis not present

## 2022-11-04 LAB — CBC W/ DIFFERENTIAL
BANDED NEUTROPHILS ABSOLUTE COUNT: 0.1 10*3/uL (ref 0.0–0.1)
BASOPHILS ABSOLUTE COUNT: 0 10*3/uL (ref 0.0–0.2)
BASOPHILS RELATIVE PERCENT: 0 %
EOSINOPHILS ABSOLUTE COUNT: 0.1 10*3/uL (ref 0.0–0.4)
EOSINOPHILS RELATIVE PERCENT: 1 %
HEMATOCRIT: 29.8 % — ABNORMAL LOW (ref 34.0–46.6)
HEMOGLOBIN: 9.9 g/dL — ABNORMAL LOW (ref 11.1–15.9)
IMMATURE GRANULOCYTES: 1 %
LYMPHOCYTES ABSOLUTE COUNT: 1.1 10*3/uL (ref 0.7–3.1)
LYMPHOCYTES RELATIVE PERCENT: 21 %
MEAN CORPUSCULAR HEMOGLOBIN CONC: 33.2 g/dL (ref 31.5–35.7)
MEAN CORPUSCULAR HEMOGLOBIN: 32.9 pg (ref 26.6–33.0)
MEAN CORPUSCULAR VOLUME: 99 fL — ABNORMAL HIGH (ref 79–97)
MONOCYTES ABSOLUTE COUNT: 0.2 10*3/uL (ref 0.1–0.9)
MONOCYTES RELATIVE PERCENT: 4 %
NEUTROPHILS ABSOLUTE COUNT: 4 10*3/uL (ref 1.4–7.0)
NEUTROPHILS RELATIVE PERCENT: 73 %
PLATELET COUNT: 144 10*3/uL — ABNORMAL LOW (ref 150–450)
RED BLOOD CELL COUNT: 3.01 x10E6/uL — ABNORMAL LOW (ref 3.77–5.28)
RED CELL DISTRIBUTION WIDTH: 14.1 % (ref 11.7–15.4)
WHITE BLOOD CELL COUNT: 5.5 10*3/uL (ref 3.4–10.8)

## 2022-11-04 LAB — CBC W/ AUTO DIFF
BASOPHILS ABSOLUTE COUNT: 0 10*9/L (ref 0.0–0.1)
BASOPHILS RELATIVE PERCENT: 0.2 %
EOSINOPHILS ABSOLUTE COUNT: 0 10*9/L (ref 0.0–0.5)
EOSINOPHILS RELATIVE PERCENT: 0.2 %
HEMATOCRIT: 28.1 % — ABNORMAL LOW (ref 34.0–44.0)
HEMOGLOBIN: 9.4 g/dL — ABNORMAL LOW (ref 11.3–14.9)
LYMPHOCYTES ABSOLUTE COUNT: 0.4 10*9/L — ABNORMAL LOW (ref 1.1–3.6)
LYMPHOCYTES RELATIVE PERCENT: 3.8 %
MEAN CORPUSCULAR HEMOGLOBIN CONC: 33.6 g/dL (ref 32.0–36.0)
MEAN CORPUSCULAR HEMOGLOBIN: 32.2 pg (ref 25.9–32.4)
MEAN CORPUSCULAR VOLUME: 96 fL — ABNORMAL HIGH (ref 77.6–95.7)
MEAN PLATELET VOLUME: 8.1 fL (ref 6.8–10.7)
MONOCYTES ABSOLUTE COUNT: 0.2 10*9/L — ABNORMAL LOW (ref 0.3–0.8)
MONOCYTES RELATIVE PERCENT: 2.3 %
NEUTROPHILS ABSOLUTE COUNT: 9.4 10*9/L — ABNORMAL HIGH (ref 1.8–7.8)
NEUTROPHILS RELATIVE PERCENT: 93.5 %
PLATELET COUNT: 86 10*9/L — ABNORMAL LOW (ref 150–450)
RED BLOOD CELL COUNT: 2.93 10*12/L — ABNORMAL LOW (ref 3.95–5.13)
RED CELL DISTRIBUTION WIDTH: 15.5 % — ABNORMAL HIGH (ref 12.2–15.2)
WBC ADJUSTED: 10.1 10*9/L (ref 3.6–11.2)

## 2022-11-04 LAB — COMPREHENSIVE METABOLIC PANEL
ALBUMIN: 3.3 g/dL — ABNORMAL LOW (ref 3.4–5.0)
ALKALINE PHOSPHATASE: 90 U/L (ref 46–116)
ALT (SGPT): 32 U/L (ref 10–49)
ANION GAP: 10 mmol/L (ref 5–14)
AST (SGOT): 31 U/L (ref <=34)
BILIRUBIN TOTAL: 0.3 mg/dL (ref 0.3–1.2)
BLOOD UREA NITROGEN: 45 mg/dL — ABNORMAL HIGH (ref 9–23)
BUN / CREAT RATIO: 40
CALCIUM: 8.8 mg/dL (ref 8.7–10.4)
CHLORIDE: 111 mmol/L — ABNORMAL HIGH (ref 98–107)
CO2: 21 mmol/L (ref 20.0–31.0)
CREATININE: 1.13 mg/dL — ABNORMAL HIGH
EGFR CKD-EPI (2021) FEMALE: 51 mL/min/{1.73_m2} — ABNORMAL LOW (ref >=60)
GLUCOSE RANDOM: 106 mg/dL (ref 70–179)
POTASSIUM: 4.4 mmol/L (ref 3.4–4.8)
PROTEIN TOTAL: 5.9 g/dL (ref 5.7–8.2)
SODIUM: 142 mmol/L (ref 135–145)

## 2022-11-04 LAB — HIGH SENSITIVITY TROPONIN I - SINGLE: HIGH SENSITIVITY TROPONIN I: 16 ng/L (ref <=34)

## 2022-11-04 LAB — MAGNESIUM: MAGNESIUM: 1.3 mg/dL — ABNORMAL LOW (ref 1.6–2.6)

## 2022-11-04 LAB — LACTATE SEPSIS, VENOUS: LACTATE BLOOD VENOUS: 1.6 mmol/L (ref 0.5–1.8)

## 2022-11-04 NOTE — Unmapped (Signed)
Returned page received to on call TNC. Pt spouse reporting pt has had diarrhea 3 times today, is vomiting, and has a fever of 102.4. He states this is how the last infection started that resulted in her being hospitalized. He said pt is lethargic and feeling poorly. I advised him to have patient evaluated in the emergency room. He agreed and  had no further questions.

## 2022-11-05 LAB — CBC
HEMATOCRIT: 28.8 % — ABNORMAL LOW (ref 34.0–44.0)
HEMOGLOBIN: 9.8 g/dL — ABNORMAL LOW (ref 11.3–14.9)
MEAN CORPUSCULAR HEMOGLOBIN CONC: 33.9 g/dL (ref 32.0–36.0)
MEAN CORPUSCULAR HEMOGLOBIN: 33.1 pg — ABNORMAL HIGH (ref 25.9–32.4)
MEAN CORPUSCULAR VOLUME: 97.6 fL — ABNORMAL HIGH (ref 77.6–95.7)
MEAN PLATELET VOLUME: 7.8 fL (ref 6.8–10.7)
PLATELET COUNT: 73 10*9/L — ABNORMAL LOW (ref 150–450)
RED BLOOD CELL COUNT: 2.95 10*12/L — ABNORMAL LOW (ref 3.95–5.13)
RED CELL DISTRIBUTION WIDTH: 15.6 % — ABNORMAL HIGH (ref 12.2–15.2)
WBC ADJUSTED: 12.6 10*9/L — ABNORMAL HIGH (ref 3.6–11.2)

## 2022-11-05 LAB — TACROLIMUS LEVEL: TACROLIMUS BLOOD: 7.5 ng/mL (ref 2.0–20.0)

## 2022-11-05 LAB — URINALYSIS WITH MICROSCOPY
BILIRUBIN UA: NEGATIVE
GLUCOSE UA: NEGATIVE
KETONES UA: NEGATIVE
NITRITE UA: POSITIVE — AB
PH UA: 6 (ref 5.0–9.0)
PROTEIN UA: 30 — AB
RBC UA: 7 /HPF — ABNORMAL HIGH (ref <=4)
SPECIFIC GRAVITY UA: 1.011 (ref 1.003–1.030)
SQUAMOUS EPITHELIAL: 7 /HPF — ABNORMAL HIGH (ref 0–5)
TRANSITIONAL EPITHELIAL: <1 /HPF (ref 0–2)
UROBILINOGEN UA: <2
WBC UA: 86 /HPF — ABNORMAL HIGH (ref 0–5)

## 2022-11-05 LAB — HEPATIC FUNCTION PANEL
ALBUMIN: 3.4 g/dL (ref 3.4–5.0)
ALKALINE PHOSPHATASE: 89 U/L (ref 46–116)
ALT (SGPT): 31 U/L (ref 10–49)
AST (SGOT): 34 U/L (ref <=34)
BILIRUBIN DIRECT: 0.1 mg/dL (ref 0.00–0.30)
BILIRUBIN TOTAL: 0.3 mg/dL (ref 0.3–1.2)
PROTEIN TOTAL: 6 g/dL (ref 5.7–8.2)

## 2022-11-05 LAB — BASIC METABOLIC PANEL
ANION GAP: 10 mmol/L (ref 5–14)
BLOOD UREA NITROGEN: 41 mg/dL — ABNORMAL HIGH (ref 9–23)
BUN / CREAT RATIO: 26
CALCIUM: 9 mg/dL (ref 8.7–10.4)
CHLORIDE: 109 mmol/L — ABNORMAL HIGH (ref 98–107)
CO2: 19 mmol/L — ABNORMAL LOW (ref 20.0–31.0)
CREATININE: 1.56 mg/dL — ABNORMAL HIGH
EGFR CKD-EPI (2021) FEMALE: 35 mL/min/{1.73_m2} — ABNORMAL LOW (ref >=60)
GLUCOSE RANDOM: 104 mg/dL (ref 70–179)
POTASSIUM: 4 mmol/L (ref 3.4–4.8)
SODIUM: 138 mmol/L (ref 135–145)

## 2022-11-05 LAB — CRYPTOCOCCAL ANTIGEN, SERUM: CRYPTOCOCCAL ANTIGEN: NEGATIVE

## 2022-11-05 LAB — TACROLIMUS LEVEL, TIMED: TACROLIMUS BLOOD: 1.9 ng/mL

## 2022-11-05 LAB — PHOSPHORUS: PHOSPHORUS: 4.1 mg/dL (ref 2.4–5.1)

## 2022-11-05 LAB — VANCOMYCIN, RANDOM: VANCOMYCIN RANDOM: 14.2 ug/mL

## 2022-11-05 MED ADMIN — piperacillin-tazobactam (ZOSYN) 3.375 g in sodium chloride 0.9 % (NS) 100 mL IVPB-MBP: 3.375 g | INTRAVENOUS | @ 02:00:00 | Stop: 2022-11-04

## 2022-11-05 MED ADMIN — iohexol (OMNIPAQUE) 350 mg iodine/mL solution 100 mL: 100 mL | INTRAVENOUS | @ 07:00:00 | Stop: 2022-11-05

## 2022-11-05 MED ADMIN — lactated ringers bolus 1,000 mL: 1000 mL | INTRAVENOUS | Stop: 2022-11-04

## 2022-11-05 MED ADMIN — magnesium sulfate in water 2 gram/50 mL (4 %) IVPB 2 g: 2 g | INTRAVENOUS | @ 05:00:00 | Stop: 2022-11-05

## 2022-11-05 MED ADMIN — lactated ringers bolus 1,000 mL: 1000 mL | INTRAVENOUS | @ 08:00:00 | Stop: 2022-11-05

## 2022-11-05 MED ADMIN — acetaminophen (OFIRMEV) 10 mg/mL injection 1,000 mg: 1000 mg | INTRAVENOUS | @ 04:00:00 | Stop: 2022-11-05

## 2022-11-05 MED ADMIN — predniSONE (DELTASONE) tablet 5 mg: 5 mg | ORAL | @ 14:00:00

## 2022-11-05 MED ADMIN — levothyroxine (SYNTHROID) tablet 112 mcg: 112 ug | ORAL | @ 11:00:00

## 2022-11-05 MED ADMIN — piperacillin-tazobactam (ZOSYN) 3.375 g in sodium chloride 0.9 % (NS) 100 mL IVPB-MBP: 3.375 g | INTRAVENOUS | @ 09:00:00 | Stop: 2022-11-05

## 2022-11-05 MED ADMIN — acetaminophen (OFIRMEV) 10 mg/mL injection 1,000 mg: 1000 mg | INTRAVENOUS | @ 08:00:00 | Stop: 2022-11-05

## 2022-11-05 MED ADMIN — valGANciclovir (VALCYTE) tablet 450 mg: 450 mg | ORAL | @ 13:00:00 | Stop: 2022-11-05

## 2022-11-05 MED ADMIN — meropenem (MERREM) 2 g in sodium chloride (NS) 0.9 % 100 mL IVPB: 2 g | INTRAVENOUS | @ 12:00:00 | Stop: 2022-11-12

## 2022-11-05 MED ADMIN — vancomycin (VANCOCIN) IVPB 1000 mg (premix): 1000 mg | INTRAVENOUS | @ 03:00:00 | Stop: 2022-11-04

## 2022-11-05 MED ADMIN — enoxaparin (LOVENOX) syringe 30 mg: 30 mg | SUBCUTANEOUS | @ 13:00:00

## 2022-11-05 MED ADMIN — aspirin chewable tablet 81 mg: 81 mg | ORAL | @ 13:00:00

## 2022-11-05 MED ADMIN — atorvastatin (LIPITOR) tablet 10 mg: 10 mg | ORAL | @ 13:00:00

## 2022-11-05 MED ADMIN — pantoprazole (Protonix) EC tablet 40 mg: 40 mg | ORAL | @ 13:00:00

## 2022-11-05 MED ADMIN — ondansetron (ZOFRAN) injection 4 mg: 4 mg | INTRAVENOUS | Stop: 2022-11-04

## 2022-11-05 MED ADMIN — tacrolimus (PROGRAF) capsule 1 mg: 1 mg | ORAL | @ 13:00:00

## 2022-11-05 NOTE — Unmapped (Signed)
Nephrology ([facility]) Progress Note    Assessment & Plan:   Julie [name] is a 72 y.o. 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 She returns for nausea, vomiting, and lethargy found to have UTI and GNR bacteremia.     Principal Problem:    UTI (urinary tract infection)  Active Problems:    Type 2 diabetes mellitus with diabetic nephropathy, with long-term current use of insulin (CMS-HCC)    End-stage renal disease (CMS-HCC)    Kidney transplant recipient    Anemia due to stage 5 chronic kidney disease, not on chronic dialysis (CMS-HCC)    Delirium  Resolved Problems:    * No resolved hospital problems. *    UTI with SIRS criteria - GNR bacteremia - Hx MDR UTIs - Transplanted kidney  Pt found to have UTI on urinalysis and GNR bacteremia on blood cx. She was recently admitted 3/14 - 4/3 for GNR bacteremia and finished course of meropenam (3/15 - 4/3) prior to discharge. No leukocytosis initially, but 12.6 on 4/14 from 10.1. Started on vanc/mere. No current surgical recommendations. Urine cx positive for E coli, susceptibilities still pending. Patient has been aferbile since 2200 on 4/13.   - Vancomycin (4/14-) and meropenem 2g q12 (4/14-4/24)  - Follow up urine Cx susceptibilities  - PRN tylenol 1g q8  - Referred to urogyn on recent discharge given recurrent UTI, appt has not been scheduled yet    AMS (improving) - Acute Toxic Metabolic Encephalopathy  Patient remains sleepy in the mornings and difficult to wake. Improved later on morning rounds and was sitting up in bed awake. However, patient still appears to have difficulty focusing, articulating thoughts, and difficult to communicate with. Overnight, she was combative and restless per nursing and husband, repeatedly trying to get out of bed. This is likely delirium 2/2 UTI and GNR bacteremia. CTH on admission normal. Has had some improvement with antibiotic treatment which is reassuring.   - Treat infection as above  - Normal diet      Nausea/vomiting/diarrhea (improving) - SBO s/p ex-lap on recent admission  Patient has had improved diarrhea without nausea or vomiting since admission. Will continue to monitor as treating for bacteremia.   - GIPP and LFTs pending     AKI - S/p DDKT 08/06/22   Follows with Dr. Elvera Maria. 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 Renal US 4/14 found decreased size of perinephric collection around transplanted kidney as compared to previous renal US. Unable to measure BMP this morning, but Cr increased 4/14 to 1.56 from 1.13 on 4/13 (baseline ~1).  - Daily BMP, P  - Home immunosuppression  - Tacrolimus 1mg  AM + 0.5mg  PM, daily tac levels   - Continue Prednisone 5 daily in setting of infection   - Hold Cellcept 500mg  BID in setting of infection  - Prophylaxis: bactrim and valcyte daily  - Cont daily aspirin     HTN  Hypertensive on admission but will be cautious with antihypertensives given SIRS picture and potential to decompensate quickly from G- sepsis. Has remained hypertensive from 143/69-174/38 in the last 24 hours, but permissive in the setting of infection, hospitalization, and holding home medications.    - Hold home coreg 25mg  bid, amlo 5mg  bid, chlorthalidone 25mg , and daily hydral 50mg   while infected      T2DM, insulin-dependent   Last A1C 5.6. Home regimen was Lantus 6 units nightly.  - Hold Lantus 6u nightly     APS report filed and pending  Per social work and transplant team. Daughters initiating guardianship application. APS report is pending. Case will be continued to be followed with transplant case manager Lowella Petties. [place] [facility] remain involved. Pt resides with husband and daughters manage meds.   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 Was transfused last admission. Currently stable at 9.4.   - Daily CBC; transfuse if Hb<7     Recent C/f thrush, candida esophagitis  During last admission. Immunosuppressed. IV micafungin 3/19 and transitioned to fluconazole on 3/26-4/3. No signs currently.    HLD- cont home statin    The patient's presentation is complicated by the following clinically significant conditions requiring additional evaluation and treatment: - Hypercoagulable state requiring additional attention to DVT prophylaxis and treatment  - Thrombocytopenia POA requiring further investigation or monitor  - Chronic kidney disease POA requiring further investigation, treatment, or monitoring   - Volume depletion POA requiring further investigation, treatment, or monitoring  - Complex social situation/SDOH requiring consultation and support of Care Management     Issues Impacting Complexity of Management:  -The patient is at high risk of complications from immunosuppression, antibiotic side effects, transplant complications    Checklist:  Diet: NPO  DVT PPx: Heparin 5000units q8h  Code Status: Full Code  Dispo: Admit to floor    Team Contact Information:   Primary Team: Nephrology ([facility])  Primary Resident: Clotilde Dieter, MD   Resident's Pager: [phone] (Nephrology Intern - [facility])  Primary Medical Student: Milagros Loll, MS3    Chief Concern:   UTI (urinary tract infection)    Subjective:   Drowsy this morning but more alert during rounds. Complained of abdominal tenderness on palpation. Per husband, she was combative, restless, and not easily redirectable overnight.     Objective:   Physical Exam:  BP 143/69  - Pulse 82  - Temp 36.4 ??C (97.5 ??F) (Oral)  - Resp 20 - Ht 152.4 cm (5')  - SpO2 100%  - BMI 24.39 kg/m??     Gen: tired appearing, opens eyes to voice and oriented to self, place, event but drowsy and falls asleep quickly and lacks good attention   HEENT: Atraumatic, normocephalic  CV: RRR  Pulm: CTA bilaterally, symetric chest rise  Abd: Soft, NTND, surgical staples in abdomen are c/d/I without sign of infection, no tenderness over renal graft site  Ext: No edema, warm and well perfused    Milagros Loll, MS3    I attest that I have reviewed the medical student note and that the components of the history of the present illness, the physical exam, and the assessment and plan documented were performed by me or were performed in my presence by the student where I verified the documentation and performed (or re-performed) the exam and medical decision making.     Clotilde Dieter, MD  [facility] Internal Medicine, PGY-1

## 2022-11-05 NOTE — Unmapped (Signed)
Nephrology ([facility]) History & Physical    Assessment & Plan:   Julie [name] is a 72 y.o. 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 She returns for nausea, vomiting, and lethargy found to have UTI.     Principal Problem:    UTI (urinary tract infection)  Active Problems:    Type 2 diabetes mellitus with diabetic nephropathy, with long-term current use of insulin (CMS-HCC)    End-stage renal disease (CMS-HCC)    Kidney transplant recipient    Anemia due to stage 5 chronic kidney disease, not on chronic dialysis (CMS-HCC)  Resolved Problems:    * No resolved hospital problems. *    UTI with SIRS criteria - Hx MDR UTIs - Transplanted kidney  Pt presenting with 1 day hx nausea, vomiting, lethargy and found to have UTI on urinalysis. She is unable to describe abdominal or flank pain due to lethargy. She was recently admitted 3/14 - 4/3 for GNR bacteremia and finished course of meropenam (3/15 - 4/3) prior to discharge. Imaging at that time showed perinephric collection new from prior (she had prior JP drain). Underwent IR guided fluid collection. She has recent kidney transplant as of Jan 2024. In the ED, hypertensive and tachy to 112, febrile to 101.9 and on room air. Lactate reassuring at 1.6. No leukocytosis, though she may not mount typical immune response given immunosuppression. Received vanc/zosyn and 2L LR. On my exam, she is lethargic and with occasional rigors but non-diaphoretic, largely comfortable appearing. She is oriented but drowsy and falls asleep frequently during interview. Originally covered with vanc/zosyn but it appears her most recent E Coli infection is not proven sensitive to this, so switched to meropenem in AM. She has no indwelling lines or catheters. Will also send GIPP given reported n/diarrhea and workup specific to transplant as below.   - Consult transplant nephro in AM  - Consider ICID consult   - Follow up CTAP to eval for pyelo  - Vanc/zosyn - > Vanc/mero  - Blood Cx pending (only able to obtain 1 due to difficult stick)  - Urine Cx pending  - Scheduled tylenol 1g q8  - Referred to urogyn on recent discharge given recurrent UTI, appt has not been scheduled yet    Lethargy  She is oriented but drowsy and falls asleep frequently during interview. Husband states this is what she always acts like when sick, denies head trauma and says she had normal mental status this AM. Suspect this is 2/2 sepsis as above and there are no clear metabolic derangements on her labs to be corrected. CTH negative. Neuro exam limited by pt drowsiness but she does move all limbs spontaneously and is oriented to self, place, situation when aroused.   - Treat infection as above'  - VBG  - NPO until mental status improves      Nausea/vomiting/?diarrhea - SBO s/p ex-lap on recent admission  She is s/p exploratory laparotomy and reduction and small bowel volvulus on 10/13/22. Per husband, has been having BM and eating well until sxs started today. Sudden onset n/v could represent SBO, as she is certainly at risk of adhesions given recent surgery. It may also reflect UTI as above.  - Follow up CTAP w/ contrast   - GIPP and LFTs pending     AKI -  S/p DDKT 08/06/22   Follows with Dr. Elvera Maria. 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 DCD, KDPI 56%, Basiliximab induction, CMV +/+, EBV +/+, standard anatomy. Post kidney transplant course has been notable for: E coli UTI. Last admission, renal US 3/14 showed small new perinephric collection which may represent hematoma, complicated seroma, or abscess. Stable resistive indices in the renal transplant arteries, which are mildly elevated. Will repeat renal US and transplant workup as below.  - Daily BMP, P  - Follow up renal transplant [place]   - Follow up BK virus, CMV virus   - Home immunosuppression  - Tacrolimus 1mg  AM + 0.5mg  PM, daily tac levels   - Hold Cellcept 500mg  BID in setting of infection  - Hold Pred 5 daily in setting of infection   - Prophylaxis: bactrim and valcyte daily  - Cont daily aspirin     HTN  Hypertensive on admission but will be cautious with antihypertensives given SIRS picture and potential to decompensate quickly from G- sepsis.  - Hold home coreg 25mg  bid, amlo 5mg  bid, chlorthalidone 25mg  daily while infected  - Cont home hydral 50mg  BID as this can be quickly titrated if blood pressures drop      T2DM, insulin-dependent   Last A1C 5.6. Home regimen was Lantus 6 units nightly.  - Lantus 6u nightly  - SSI     APS report filed and pending  Per social work and transplant team. Daughters initiating guardianship application. APS report is pending. Case will be continued to be followed with transplant case manager Lowella Petties. [place] [facility] remain involved. Pt resides with husband and daughters manage meds.   - Appreciate CM involvement   - Husband Julie [name] is listed as HCDM in chart until investigation finalized, and pt is too lethargic on admission to determine HCDM    Chronic issues  Acute on Chronic Thrombocytopenia- Anemia on CKD  Suspect in the setting of immunosuppression for kidney transplant. Was transfused last admission. Currently stable at 9.4.   - Daily CBC; transfuse if Hb<7     Recent C/f thrush, candida esophagitis  During last admission. Immunosuppressed. IV micafungin 3/19 and transitioned to fluconazole on 3/26-4/3. No signs currently.    HLD- cont home statin    The patient's presentation is complicated by the following clinically significant conditions requiring additional evaluation and treatment: - Hypercoagulable state requiring additional attention to DVT prophylaxis and treatment  - Thrombocytopenia POA requiring further investigation or monitor  - Chronic kidney disease POA requiring further investigation, treatment, or monitoring   - Volume depletion POA requiring further investigation, treatment, or monitoring  - Complex social situation/SDOH requiring consultation and support of Care Management     Issues Impacting Complexity of Management:  -The patient is at high risk of complications from immunosuppression, antibiotic side effects, transplant complications    Checklist:  Diet: NPO  DVT PPx: Lovenox 30mg  q24h  Code Status: Full Code  Dispo: Admit to floor    Team Contact Information:   Primary Team: Nephrology ([facility])  Primary Resident: Devota Pace, MD  Resident's Pager: [phone] (Nephrology Intern - [facility])    Chief Concern:   UTI (urinary tract infection)    Subjective:   History obtained by patient and husband.    HPI:  History obtained by patient's husband at bedside. Pt was discharged a few weeks ago after hospitalization for bacteremia and SBO requiring surgery. Was doing well at home. Working with PT  and regaining strength. However today after lunch she started having NBNB emesis. He took her temp which was 102 at max. Husband thinks had some diarrhea as well which was nonbloody or dark. She did not tell him about any abdominal pain. He does not recall which side the renal graft is on. Denies sick contacts, travel. He reports pt has been compliant with all medicines since discharge including immunosuppressants. Pt had renal transplant in Jan 2024. He left her list of meds at home but reports only changes since discharge were B12 and vit D supplements and prn lokelma. Pt's daughter handles the pill box. Doesn't live with them but lives nearby in [place].     Attempted to interview pt, and she is oriented to self, place, event. However she is drowsy and falls asleep frequently during interview.     Of note, an APS report was filed last admission by pt's adult daughters regarding pt's home situation. Defer to social work.    Unable to report ETOH, alcohol, drug use. They live with husband. HCDM is husband. Full code.    In the ED:  Vitals: 153/58, febrile to 101.9, HR 90-112, RA  Labs: Notable for Hb 9.4 (baseline), plts 86, WBC 10, Cr 1.1/BUN 45, Mg 1.3, LA 1.6  Cultures: RPP neg, UA with large LE, positive nitrites, trace blood, WBC 8. Blood Cx pending.  EKG: NSR  Imaging: CXR: my read unremarkable; official read: Bibasilar opacities which may represent edema or infection.    ED Interventions: 2L LR, vanc/zosyn, 2g IV mag, IV zofran    Medical reconciliation is accurate as above in Plan.     Social Hx, Surgical Hx, Family Hx- Not applicable, unless stated above    Allergies  Oxycodone, Gabapentin, and Valacyclovir    Designated Healthcare Decision Maker:  Ms. Porte currently has decisional capacity for healthcare decision-making and is able to designate a surrogate healthcare decision maker. Ms. Pontecorvo designated healthcare decision maker(s) is/are Julie [name], the patient's spouse as denoted by patient stated preference.    Objective:   Physical Exam:  Temp:  [37.2 ??C (99 ??F)-38.8 ??C (101.9 ??F)] 37.2 ??C (99 ??F)  Heart Rate:  [90-112] 94  SpO2 Pulse:  [86] 86  Resp:  [16-30] 25  BP: (103-162)/(38-100) 103/74  SpO2:  [94 %-99 %] 95 %    Gen: lethargic, opens eyes to voice and oriented to self, place, event but drowsy and falls asleep quickly  HEENT: Atraumatic, normocephalic  CV: RRR  Pulm: CTA bilaterally, symetric chest rise  Abd: Soft, NTND, surgical staples in abdomen are c/d/I without sign of infection, no tenderness over renal graft site  Ext: No edema, warm and well perfused

## 2022-11-05 NOTE — Unmapped (Signed)
[facility]  Emergency Department Provider Note     HPI     Julie [name] is a very pleasant 72 y.o. female  has a past medical history of Anemia, 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), Hypertension, Hypothyroidism (acquired), Kidney transplant status, cadaveric (08/08/2022), and Type 2 diabetes mellitus (CMS-HCC) presents to the ED today with complaint of vomiting and altered mental status.  Patient cannot provide history given mental status at this time however patient's husband at bedside reports that she had 3 episodes of emesis this afternoon and reported feeling unwell.  When he took her temperature the oral thermometer is noted to be 101 initially, Tmax of 102 on repeat temperature check at home.  Husband proceeded to call the transplant center who subsequently referred them to the ED for further evaluation.    Upon chart review, patient had a recent hospitalization on 10/05/2022 for E. coli bacteremia.  Blood and urine cultures grew MDR E. coli and she was treated with meropenem until 10/23/2022.  She is also s/p exploratory laparotomy and reduction of small bowel volvulus on 10/13/2022      MDM     BP 146/38  - Pulse 90  - Temp 37.6 ??C (99.7 ??F) (Oral)  - Resp 24  - SpO2 97%      Hemodynamically stable. On exam, patient is overall well-appearing in no acute distress.  Systolic murmur.  Lungs are CTA bilaterally.  Abdomen soft nontender to palpation with midline incision that appears to be healing well with staples in place, no visible wound dehiscence.  No lower extremity edema. Given given patient's history, presentation, and physical exam, this patient presents with altered mental status, concerning for UTI versus bacteremia given her history. Labs and exam were inconsistent with toxic metabolic etiologies such as electrolyte disturbances (Na/Ca), hypoglycemia, and uremia; acidosis states. History and exam make toxidromes of intoxication or withdrawal, hypoxemia or hypercarbia, liver disease or failure causing hepatic encephalopathy, adrenal insufficiency, seizure, trauma, intracranial bleeds or ischemic stroke less likely. Given patient's recent exploratory laparotomy, intra-abdominal abscess is also in the differential and we will proceed with CT abdomen/pelvis with IV contrast.  Diagnostic workup as below. Will treat patient with IV fluids, empiric antibiotics with vancomycin, Zosyn as well as Zofran as needed. Disposition pending further work-up. See progress notes below.     Orders Placed This Encounter   Procedures    Blood Culture    Blood Culture    Urine Culture    Respiratory Pathogen Panel    XR Chest Portable    CT Abdomen Pelvis W IV Contrast    CT Head Wo Contrast    CBC w/ Differential    Comprehensive Metabolic Panel    Lactate Sepsis, Venous    Urinalysis with Microscopy    Magnesium Level    hsTroponin I (single, no delta)    Basic metabolic panel    CBC    Nutrition Therapy Regular/House    Misc nursing order (Sepsis Timer)    Cardiac Monitor    Oxygen sat continuous monitoring    Notify Provider    Notify Provider    In and Out (I & O) cath    Notify Provider    Vital signs    RN to notify pharmacy immediately that an antibiotic has been ordered from the Sepsis Order Set (if not available in the [employer] or if not yet verified)    Vital signs    Notify Provider    Patient may shower  Weigh patient    Incentive Spirometry    Flush line per protocol    Full Code    ECG 12 Lead    Insert peripheral IV    Insert 2nd peripheral IV    Insert peripheral IV    Saline lock IV    Place Patient in Bed    ED Admit Decision       ED Course as of 11/05/22 0403   Sat Nov 04, 2022   2136 EKG showing NSR with a rate of 98 bpm, normal axis and normal intervals, normal R wave progression.  No ST elevation.   2206 CXR with poor inspiratory effort, slight opacification in left lower lobe may represent atelectasis versus developing pneumonia.    2220 Hemoglobin at baseline.  Derm cytopenia slightly worse from prior.  Lactate within normal limits.  Glucose within normal limits.   2302 Fever 101.9, will proceed with IV Tylenol as patient declines p.o. medication   2352 Magnesium low, will replete with 2 g   2352 Creatinine at baseline.   Sun Nov 05, 2022   0215 Urinalysis with large leukocyte esterase, nitrite +86 WBCs and bacteriuria likely the cause of patient's altered mental status.  She has been covered with vancomycin, Zosyn.  MAO paged for admission       The case was discussed with the attending physician who is in agreement with the above assessment and plan.    - Any discussion of this patient's case/presentation between myself and consultants, admitting teams, or other team members has been documented above.  - Imaging and other studies, if performed, that were available during my care of the patient were independently reviewed and interpreted by me and considered in my medical decision making as documented above.  - External records reviewed: Reviewed discharge summary from 10/05/2022     ED Clinical Impression     Final diagnoses:   Acute cystitis with hematuria (Primary)   Delirium        Physical Exam     Vitals:    11/04/22 1827 11/04/22 1829 11/04/22 2257 11/05/22 0056   BP: 153/58  156/100 146/38   Pulse:   112 90   Resp:  16 30 24    Temp:  37.4 ??C (99.3 ??F) (!) 38.8 ??C (101.9 ??F) 37.6 ??C (99.7 ??F)   TempSrc:  Oral Oral Oral   SpO2:   94% 97%        Constitutional: In no acute distress.  Eyes: Conjunctivae are normal. EOMI.   HEENT: Normocephalic and atraumatic. Mucous membranes are dry.   Neck: Full active range of motion  Cardiovascular: See heart rate listed above.  Systolic murmur.  Normal skin perfusion.   Respiratory: See respiratory rate listed above.  Lungs are clear to auscultation bilaterally.  Speaking easily in full sentences  Gastrointestinal: Soft, non-distended, non-tender.  No guarding.  Musculoskeletal: No long bone deformities.   Neurologic: Alert, disoriented.  Normal speech and language. No gross focal neurologic deficits are appreciated.   Skin: Skin is warm, dry.   Psychiatric: Mood and affect are normal.     Past History     PAST MEDICAL HISTORY/PAST SURGICAL HISTORY:   Past Medical History:   Diagnosis Date    Anemia     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)     since July 2017    Hypertension     Hypothyroidism (acquired)     Kidney transplant status, cadaveric 08/08/2022    Type 2 diabetes mellitus (  CMS-HCC)        Past Surgical History:   Procedure Laterality Date    CATARACT EXTRACTION      HYSTERECTOMY      OOPHORECTOMY      PERITONEAL CATHETER INSERTION      PR LAP INSERTION TUNNELED INTRAPERITONEAL CATHETER N/A 09/27/2018    Procedure: LAPAROSCOPY, SURGICAL; WITH INSERTION OF INTRAPERITONEAL CANNULA OR CATHETER, PERMANENT;  Surgeon: Leona Carry, MD;  Location: MAIN OR [place];  Service: Transplant    PR LAP INSERTION TUNNELED INTRAPERITONEAL CATHETER N/A 04/08/2019    Procedure: LAPAROSCOPY, SURGICAL; WITH INSERTION OF INTRAPERITONEAL CANNULA OR CATHETER, PERMANENT;  Surgeon: Leona Carry, MD;  Location: MAIN OR [facility];  Service: Transplant    PR LAP REVISE INTRAPERITONEAL CATHETER N/A 08/26/2019    Procedure: LAPAROSCOPY, SURGICAL; W/REVIS PREV PLACED INTRAPERITONEAL CANNULA/CATH, REMOV INTRALUMIN OBSTRUCT MATERIAL;  Surgeon: Leona Carry, MD;  Location: MAIN OR [facility];  Service: Transplant    PR REDUCE VOLVULUS,INTUSS,INTERN HERNIA N/A 10/13/2022    Procedure: REDUCTION OF VOLVULUS, INTUSSUSCEPTION, INTERNAL HERNIA, BY LAPAROTOMY;  Surgeon: Suella Broad, MD;  Location: MAIN OR [place];  Service: Trauma    PR REMOVAL TUNNELED INTRAPERITONEAL CATHETER N/A 05/06/2018    Procedure: REMOVAL OF PERMANENT INTRAPERITONEAL CANNULA OR CATHETER;  Surgeon: Leona Carry, MD;  Location: MAIN OR [facility];  Service: Transplant    PR REMOVE PERITONEAL FOREIGN BODY N/A 04/08/2019    Procedure: Removal Of Peritoneal Of Foreign Body From Peritoneal Cavity; Surgeon: Leona Carry, MD;  Location: MAIN OR [facility];  Service: Transplant    PR TRANSPLANT,PREP RENAL GRAFT/ARTERIAL Right 08/06/2022    Procedure: [facility] RECONSTRUCTION CADAVER/LIVING DONOR RENAL ALLOGRAFT PRIOR TO TRANSPLANT; ARTERIAL ANASTOMOSIS EAC;  Surgeon: Toledo, Lilyan Punt, MD;  Location: MAIN OR [facility];  Service: Transplant    PR TRANSPLANTATION OF KIDNEY Right 08/06/2022    Procedure: RENAL ALLOTRANSPLANTATION, IMPLANTATION OF GRAFT; WITHOUT RECIPIENT NEPHRECTOMY;  Surgeon: Toledo, Lilyan Punt, MD;  Location: MAIN OR [facility];  Service: Transplant    TUBAL LIGATION         MEDICATIONS:     Current Facility-Administered Medications:     acetaminophen (OFIRMEV) 10 mg/mL injection 1,000 mg, 1,000 mg, Intravenous, Q8H, Wyline Beady, MD, Last Rate: 400 mL/hr at 11/05/22 0359, 1,000 mg at 11/05/22 0359    aluminum-magnesium hydroxide-simethicone (MAALOX MAX) 80-80-8 mg/mL oral suspension, 30 mL, Oral, Q4H PRN, Annell Greening, Harlen Labs, MD    enoxaparin (LOVENOX) syringe 30 mg, 30 mg, Subcutaneous, Q24H, Tosi, Harlen Labs, MD    guaiFENesin (ROBITUSSIN) oral syrup, 200 mg, Oral, Q4H PRN, Wyline Beady, MD    lactated ringers bolus 1,000 mL, 1,000 mL, Intravenous, Once, Disaya Walt J, DO, 1,000 mL at 11/05/22 0359    melatonin tablet 3 mg, 3 mg, Oral, Nightly PRN, Wyline Beady, MD    ondansetron (ZOFRAN-ODT) disintegrating tablet 4 mg, 4 mg, Oral, Q8H PRN **OR** ondansetron (ZOFRAN) injection 4 mg, 4 mg, Intravenous, Q8H PRN, Tosi, Harlen Labs, MD    piperacillin-tazobactam (ZOSYN) 3.375 g in sodium chloride 0.9 % (NS) 100 mL IVPB-MBP, 3.375 g, Intravenous, Q6H, Ashden Sonnenberg J, DO    Current Outpatient Medications:     amlodipine (NORVASC) 5 MG tablet, Take 1 tablet (5 mg total) by mouth every twelve (12) hours., Disp: 90 tablet, Rfl: 3    aspirin (ECOTRIN) 81 MG tablet, Take 1 tablet (81 mg total) by mouth daily., Disp: 90 tablet, Rfl: 3    atorvastatin (LIPITOR) 10 MG tablet, Take 1 tablet (10  mg total) by mouth daily., Disp: 90 tablet, Rfl: 3    blood sugar diagnostic (ACCU-CHEK AVIVA PLUS TEST STRP) Strp, USE 1 STRIP TO CHECK GLUCOSE THREE TIMES DAILY, Disp: , Rfl:     blood sugar diagnostic (GLUCOSE BLOOD) Strp, Use to check blood sugar as directed with insulin 3 times a day & for symptoms of high or low blood sugar., Disp: 100 strip, Rfl: 11    blood sugar diagnostic Strp, USE 1 STRIP TO CHECK GLUCOSE THREE TIMES DAILY, Disp: , Rfl:     blood-glucose meter kit, Use as directed. ACCU-CHEK AVIVA, Disp: , Rfl:     blood-glucose meter kit, Use as instructed, Disp: 1 each, Rfl: 0    blood-glucose sensor (DEXCOM G7 SENSOR) Devi, Use 1 sensor every 10 days., Disp: 3 each, Rfl: 11    calcium carbonate (TUMS) 200 mg calcium (500 mg) chewable tablet, Chew 1 tablet (200 mg of elem calcium total) Three (3) times a day as needed., Disp: 90 tablet, Rfl: 0    carvedilol (COREG) 25 MG tablet, Take 1 tablet (25 mg total) by mouth two (2) times a day., Disp: 180 tablet, Rfl: 3    chlorthalidone (HYGROTON) 25 MG tablet, Take 1 tablet (25 mg total) by mouth daily., Disp: 90 tablet, Rfl: 3    docusate sodium (COLACE) 100 MG capsule, Take 1 capsule (100 mg total) by mouth two (2) times a day as needed for constipation., Disp: 60 capsule, Rfl: 11    hydrALAZINE (APRESOLINE) 50 MG tablet, Take 1 tablet (50 mg total) by mouth two (2) times a day., Disp: 60 tablet, Rfl: 0    insulin glargine (BASAGLAR, LANTUS) 100 unit/mL (3 mL) injection pen, Inject 0.06 mL (6 Units total) under the skin nightly., Disp: 1.8 mL, Rfl: 0    lancets Misc, Use to check blood sugar as directed with insulin 3 times a day & for symptoms of high or low blood sugar., Disp: 100 each, Rfl: 0    levothyroxine (SYNTHROID) 112 MCG tablet, Take 1 tablet (112 mcg total) by mouth daily., Disp: 90 tablet, Rfl: 3    magnesium oxide-Mg AA chelate (MAGNESIUM PLUS PROTEIN) 133 mg, Take 2 tablets by mouth two (2) times a day., Disp: 90 tablet, Rfl: 0    melatonin 3 mg Tab, Take 2 tablets (6 mg total) by mouth every evening., Disp: 30 tablet, Rfl: 0    methocarbamol (ROBAXIN) 500 MG tablet, Take 1 tablet (500 mg total) by mouth four (4) times a day as needed (pain/cramping)., Disp: 30 tablet, Rfl: 0    mycophenolate (CELLCEPT) 250 mg capsule, Take 2 capsules (500 mg total) by mouth two (2) times a day., Disp: 360 capsule, Rfl: 3    pantoprazole (PROTONIX) 40 MG tablet, Take 1 tablet (40 mg total) by mouth daily., Disp: 90 tablet, Rfl: 3    pen needle, diabetic 32 gauge x 5/32 (4 mm) Ndle, Use with insulin up to 4 times/day as needed., Disp: 100 each, Rfl: 11    pen needle, diabetic 32 gauge x 5/32 Ndle, Use with insulin 2 times daily, Disp: , Rfl:     polyethylene glycol (GLYCOLAX) 17 gram/dose powder, Take 17 g by mouth daily as needed., Disp: 510 g, Rfl: 11    predniSONE (DELTASONE) 5 MG tablet, Take 1 tablet (5 mg total) by mouth in the morning., Disp: 90 tablet, Rfl: 3    predniSONE (DELTASONE) 5 MG tablet, Take 1 tablet (5 mg total) by mouth in the morning.,  Disp: 90 tablet, Rfl: 3    SENNA 8.6 mg tablet, Take 1 tablet by mouth daily as needed for constipation., Disp: 30 tablet, Rfl: 11    sulfamethoxazole-trimethoprim (BACTRIM) 400-80 mg per tablet, Take 1 tablet (80 mg of trimethoprim total) by mouth Every Monday, Wednesday, and Friday., Disp: 36 tablet, Rfl: 1    tacrolimus (PROGRAF) 0.5 MG capsule, Take 2 capsules (1 mg total) by mouth daily AND 1 capsule (0.5 mg total) nightly., Disp: 180 capsule, Rfl: 0    valGANciclovir (VALCYTE) 450 mg tablet, Take 1 tablet (450 mg total) by mouth daily., Disp: 90 tablet, Rfl: 0    ALLERGIES:   Oxycodone, Gabapentin, and Valacyclovir    SOCIAL HISTORY:   Social History     Tobacco Use    Smoking status: Former     Current packs/day: 0.00     Average packs/day: 0.8 packs/day for 30.0 years (22.5 ttl pk-yrs)     Types: Cigarettes     Start date: 06/06/1976     Quit date: 06/06/2006     Years since quitting: 16.4    Smokeless tobacco: Former   Substance Use Topics    Alcohol use: Yes     Comment: only special occasions       FAMILY HISTORY:  Family History   Problem Relation Age of Onset    Diabetes Mother     Alzheimer's disease Father     Diabetes Sister     Diabetes Sister     Diabetes Sister     Anesthesia problems Neg Hx     Bleeding Disorder Neg Hx          Radiology     CT Head Wo Contrast   Preliminary Result   No acute intracranial abnormality.         XR Chest Portable   Final Result      Bibasilar opacities which may represent edema or infection.         CT Abdomen Pelvis W IV Contrast    (Results Pending)        Laboratory Data     Lab Results   Component Value Date    WBC 10.1 11/04/2022    HGB 9.4 (L) 11/04/2022    HCT 28.1 (L) 11/04/2022    PLT 86 (L) 11/04/2022       Lab Results   Component Value Date    NA 142 11/04/2022    K 4.4 11/04/2022    CL 111 (H) 11/04/2022    CO2 21.0 11/04/2022    BUN 45 (H) 11/04/2022    CREATININE 1.13 (H) 11/04/2022    GLU 106 11/04/2022    CALCIUM 8.8 11/04/2022    MG 1.3 (L) 11/04/2022    PHOS 3.4 10/25/2022       Lab Results   Component Value Date    BILITOT 0.3 11/04/2022    BILIDIR 0.10 06/06/2022    PROT 5.9 11/04/2022    ALBUMIN 3.3 (L) 11/04/2022    ALT 32 11/04/2022    AST 31 11/04/2022    ALKPHOS 90 11/04/2022    GGT 21 08/06/2022       Lab Results   Component Value Date    INR 0.92 08/06/2022    APTT 38.1 10/12/2022       Kristopher Glee, DO  Emergency Medicine, PGY-1    Portions of this record have been created using Dragon dictation software. Dictation errors have been sought, but may not have been identified  and corrected.         Corinna Lines, DO  Resident  11/05/22 [phone]

## 2022-11-05 NOTE — Unmapped (Addendum)
Julie [name] is a 72 y.o. 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 She returns for nausea, vomiting, and lethargy found to have UTI and E.coli bacteremia.     EBSL E. Coli Bacteremia 2/2 Urinary Source - Hx MDR UTIs - Transplanted kidney  Patient presented to the ED with 1 day of nausea, vomiting, lethargy and found to have UTI on urinalysis. She is unable to describe abdominal or flank pain due to lethargy. She was recently admitted from 3/14 - 4/3 for GNR bacteremia and finished course of meropenam (3/15 - 4/3) prior to discharge. Imaging at that time showed perinephric collection new from prior (she had prior JP drain) and she underwent IR guided fluid collection. She has recent kidney transplant as of Jan 2024. There was initially no leukocytosis, though she may not mount typical immune response given immunosuppression. She received vanc/zosyn and 2L LR. Blood cultures showed EBSL E. Coli bacteremia and her urine culture found E coli. Repeat blood cultures negative. Her antibiotics were consolidated to meropenem given previous susceptibilities. Transplant surgery was consulted and advised no current surgical recommendations. Patient has been aferbile since 2200 on 4/13. ICID was consulted. Had tunneled line placed from VIR on 4/22 and discharge on meropenem for full 14 day course through 4/27. Discussion regarding potential fosfomycin prescription for prophylaxis however this was cost prohibitive and therefore was not pursued. Further discussion regarding methenamine + vitamin C can be pursued outpatient.     Delirium (resolved) - Acute Toxic Metabolic Encephalopathy   Patient presented with lethargy and altered mental status. She remained sleepy in the mornings and difficult to wake, but improved later in the morning and has been sitting up in bed awake. Patient's mental status now back at baseline, A&O x4 asking when she can leave the hospital. She was responding appropriately and alert and oriented. Patient has had hypoactive and hyperactive delirium. Initially concerned for encephalitis/meningitis given prior fevers and mental status but given improvement in mental status and resolution of fevers this is now a low concern. VZV pcr negative and crypto antigen negative.      Nausea/vomiting/diarrhea (improving) - SBO s/p ex-lap on recent admission  Patient is s/p exploratory laparotomy and reduction and small bowel volvulus on 10/13/22. Per husband, has been having BM and eating well until nausea and vomiting started today. Abdominal CT was reassuring for no small bowel obstruction, though she is at increased risk. It may also reflect UTI as above. Patient continued to have less diarrhea without nausea or vomiting since admission. Will continue to monitor as treating for bacteremia. Staples removed from Ex-lap on 4/18.      AKI - S/p DDKT 08/06/22   Patient follows with Dr. 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 Renal ultrasound on 4/14 found decreased size of perinephric collection around transplanted kidney as compared to previous renal US. Creatinine is increased 4/14 to 1.56 from 1.13 on 4/13 (baseline ~1). She was continued on home immunosuppression with tacrolimus and daily tac levels, prednisone, but home cellcept was held initially in the setting of ongoing infection and restarted. Prophylaxis was continued with bactrim and valcyte. VIR reconsulted for possible perinephric fluid drainage for potential source control but felt location made this extremely risky with difficulty to effectively drain. Cr  stable and at baseline.      HTN  Patient was hypertensive on admission and home antihypertensives were held given bacteremia and potential to decompensate quickly from gram negative sepsis. She has remained hypertensive, but this was permissive in the setting of infection, hospitalization. Restarted home blood pressure regimen.      T2DM, insulin-dependent   Patients last A1C was 5.6 and she is on a home regimen of Lantus 6 units nightly (she takes at lunch). With good blood glucose control, home regimen was held and blood glucose remained within normal limits. Discharged on lantus 4 units with meal time insulin held.     APS report filed and pending  Per social work and transplant team, the patient's daughters are initiating guardianship application with an APS report pending. Case will be continued to be followed with transplant case manager Lowella Petties. [place] [facility] remain involved. Patient resides with husband and daughters manage meds. Clarified with patient 4/18 with no family present at bedside that her husband Patina Spanier is her stated preference HCDM.      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 She was transfused last admission and is currently stable at above 9.4.      Recent C/f thrush, candida esophagitis  During last admission patient had episode of thrush and esophageal candidiasis that was treated with IV micafungin 3/19 and transitioned to fluconazole on 3/26-4/3. No signs on this admission.     HLD- Patient's home statin was continued.

## 2022-11-05 NOTE — Unmapped (Signed)
Vancomycin Therapeutic Monitoring Pharmacy Note    Julie [name] is a 72 y.o. female continuing vancomycin. Date of therapy initiation: 11/04/22    Indication: Urinary Tract Infection (UTI)    Prior Dosing Information:  1g vanc x1      Goals:  Therapeutic Drug Levels  Vancomycin trough goal: 10-15 mg/L    Additional Clinical Monitoring/Outcomes  Renal function, volume status (intake and output)    Results: Not applicable    Wt Readings from Last 1 Encounters:   10/25/22 56.7 kg (124 lb 14.3 oz)     Creatinine   Date Value Ref Range Status   11/04/2022 1.13 (H) 0.55 - 1.02 mg/dL Final   19/14/7829 5.62 (H) 0.57 - 1.00 mg/dL Final   13/02/6577 4.69 (H) 0.57 - 1.00 mg/dL Final        Pharmacokinetic Considerations and Significant Drug Interactions:  Adult (estimated initial): Vd = 40.3 L, ke = 0.0462 hr-1  Concurrent nephrotoxic meds:  zosyn    Assessment/Plan:  Recommendation(s)  Start vancomycin 750mg  q24hr  Estimated trough on recommended regimen:  10 mg/L    Follow-up  Level due: prior to fourth or fifth dose  A pharmacist will continue to monitor and order levels as appropriate    Please page service pharmacist with questions/clarifications.    Cephus Slater, PharmD

## 2022-11-05 NOTE — Unmapped (Signed)
New Surgery Consult Note      Requesting Attending Physician:  Neysa Hotter, MD  Service Requesting Consult:  Nephrology (MDB)  Service Providing Consult: Transplant Surgery, SRF  Consulting Attending: Kapoor    Assessment:  Patient is a 72 y.o. female with h/o DCD DDKT in 07/2022, hypertension, insulin dependent type 2 diabetes , hypothyroidism, and recent admission 3/14-4/3 for GNR bacteremia also underwent exploratory laparotomy and reduction and small bowel volvulus on 10/13/22 and during that admission had CTAP 3/14 showing perinephric collection consistent with previous renal US and c/f renal infection, possibly pyelonephritis and repeat ultrasound TXP kidney showed 8.1cm hematoma, possibly communicating 2.2cm complex collection on upper pole of kidney so the pt underwent VIR aspiration 3/19. After VIR aspiration repeat CT 3/21 showed resolution of fluid collection. She is now presenting with N/V.    Workup showed  WBC 12.6. UA with large leukocyte esterase, WBC, and positive nitrites  CT: Increased inflammatory stranding surrounding the left lower quadrant transplant kidney with a 8.3 cm crescentic fluid collection within the overlying soft tissue     Recommendations:   - Agree with workup and antibiotics per ID  - Small sized thin perinephric collection is likely not source of infection and given workup consistent with UTI/enteritis which is likely source  - Recommend transplant renal US to rule out hydronephrosis  - No acute indication for surgical intervention   - We will sign off at this time    This patient and plan was discussed with Dr. Edwin Dada    History of Present Illness:   Julie [name] is seen in consultation for history of DDKT with surrounding fluid collection at the request of Neysa Hotter, MD on the Nephrology (MDB) service.     Patient is a 72 y.o. female with h/o DCD DDKT in 07/2022, hypertension, insulin dependent type 2 diabetes , hypothyroidism, and recent admission 3/14-4/3 for GNR bacteremia also underwent exploratory laparotomy and reduction and small bowel volvulus on 10/13/22 and during that admission had CTAP 3/14 showing perinephric collection consistent with previous renal US and c/f renal infection, possibly pyelonephritis and repeat ultrasound TXP kidney showed 8.1cm hematoma, possibly communicating 2.2cm complex collection on upper pole of kidney so the pt underwent VIR aspiration 3/19. After VIR aspiration repeat CT 3/21 showed resolution of fluid collection. Per pt and husband her symptoms began yesterday with N/V, diarrhea, and lethargy. They report she is still passing gas. Husband says that her temperature at home was between 20F and 101F. He says this episode is very similar to her previous admission.    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)     since July 2017    Hypertension     Hypothyroidism (acquired)     Kidney transplant status, cadaveric 08/08/2022    Type 2 diabetes mellitus (CMS-HCC)        Past Surgical History:   Procedure Laterality Date    CATARACT EXTRACTION      HYSTERECTOMY      OOPHORECTOMY      PERITONEAL CATHETER INSERTION      PR LAP INSERTION TUNNELED INTRAPERITONEAL CATHETER N/A 09/27/2018    Procedure: LAPAROSCOPY, SURGICAL; WITH INSERTION OF INTRAPERITONEAL CANNULA OR CATHETER, PERMANENT;  Surgeon: Leona Carry, MD;  Location: MAIN OR [facility];  Service: Transplant  PR LAP INSERTION TUNNELED INTRAPERITONEAL CATHETER N/A 04/08/2019    Procedure: LAPAROSCOPY, SURGICAL; WITH INSERTION OF INTRAPERITONEAL CANNULA OR CATHETER, PERMANENT;  Surgeon: Leona Carry, MD;  Location: MAIN OR [facility];  Service: Transplant    PR LAP REVISE INTRAPERITONEAL CATHETER N/A 08/26/2019    Procedure: LAPAROSCOPY, SURGICAL; W/REVIS PREV PLACED INTRAPERITONEAL CANNULA/CATH, REMOV INTRALUMIN OBSTRUCT MATERIAL;  Surgeon: Leona Carry, MD;  Location: MAIN OR [facility];  Service: Transplant    PR REDUCE VOLVULUS,INTUSS,INTERN HERNIA N/A 10/13/2022    Procedure: REDUCTION OF VOLVULUS, INTUSSUSCEPTION, INTERNAL HERNIA, BY LAPAROTOMY;  Surgeon: Suella Broad, MD;  Location: MAIN OR [place];  Service: Trauma    PR REMOVAL TUNNELED INTRAPERITONEAL CATHETER N/A 05/06/2018    Procedure: REMOVAL OF PERMANENT INTRAPERITONEAL CANNULA OR CATHETER;  Surgeon: Leona Carry, MD;  Location: MAIN OR [facility];  Service: Transplant    PR REMOVE PERITONEAL FOREIGN BODY N/A 04/08/2019    Procedure: Removal Of Peritoneal Of Foreign Body From Peritoneal Cavity;  Surgeon: Leona Carry, MD;  Location: MAIN OR [facility];  Service: Transplant    PR TRANSPLANT,PREP RENAL GRAFT/ARTERIAL Right 08/06/2022    Procedure: [facility] : [facility] RECONSTRUCTION CADAVER/LIVING DONOR RENAL ALLOGRAFT PRIOR TO TRANSPLANT; ARTERIAL ANASTOMOSIS EAC;  Surgeon: Toledo, Lilyan Punt, MD;  Location: MAIN OR [facility];  Service: Transplant    PR TRANSPLANTATION OF KIDNEY Right 08/06/2022    Procedure: RENAL ALLOTRANSPLANTATION, IMPLANTATION OF GRAFT; WITHOUT RECIPIENT NEPHRECTOMY;  Surgeon: Toledo, Lilyan Punt, MD;  Location: MAIN OR [facility];  Service: Transplant    TUBAL LIGATION         Medication:  Current Facility-Administered Medications   Medication Dose Route Frequency Provider Last Rate Last Admin    acetaminophen (OFIRMEV) 10 mg/mL injection 1,000 mg  1,000 mg Intravenous Q8H PRN Boccaccio, Dominic E, MD        aluminum-magnesium hydroxide-simethicone (MAALOX MAX) 80-80-8 mg/mL oral suspension  30 mL Oral Q4H PRN Wyline Beady, MD        aspirin chewable tablet 81 mg  81 mg Oral Daily Wyline Beady, MD   81 mg at 11/05/22 0859    atorvastatin (LIPITOR) tablet 10 mg  10 mg Oral Daily Wyline Beady, MD   10 mg at 11/05/22 0859    enoxaparin (LOVENOX) syringe 30 mg  30 mg Subcutaneous Q24H Devota Pace C, MD   30 mg at 11/05/22 0859 guaiFENesin (ROBITUSSIN) oral syrup  200 mg Oral Q4H PRN Wyline Beady, MD        [Provider Hold] insulin glargine (LANTUS) injection 6 Units  6 Units Subcutaneous Nightly Tosi, Harlen Labs, MD        [Provider Hold] lactated ringers bolus 1,000 mL  1,000 mL Intravenous Once Wyline Beady, MD        levothyroxine (SYNTHROID) tablet 112 mcg  112 mcg Oral daily Wyline Beady, MD   112 mcg at 11/05/22 1610    melatonin tablet 3 mg  3 mg Oral Nightly PRN Wyline Beady, MD        meropenem (MERREM) 2 g in sodium chloride (NS) 0.9 % 100 mL IVPB  2 g Intravenous Q12H Wyline Beady, MD 300 mL/hr at 11/05/22 0817 2 g at 11/05/22 0817    ondansetron (ZOFRAN-ODT) disintegrating tablet 4 mg  4 mg Oral Q8H PRN Wyline Beady, MD        Or    ondansetron (ZOFRAN) injection 4 mg  4 mg Intravenous Q8H PRN Wyline Beady, MD  pantoprazole (Protonix) EC tablet 40 mg  40 mg Oral Daily Wyline Beady, MD   40 mg at 11/05/22 0859    polyethylene glycol (MIRALAX) packet 17 g  17 g Oral Daily PRN Wyline Beady, MD        predniSONE (DELTASONE) tablet 5 mg  5 mg Oral Daily Boccaccio, Dominic E, MD        tacrolimus (PROGRAF) capsule 1 mg  1 mg Oral Daily Wyline Beady, MD   1 mg at 11/05/22 0900    And    tacrolimus (PROGRAF) capsule 0.5 mg  0.5 mg Oral Nightly Wyline Beady, MD        valGANciclovir (VALCYTE) tablet 450 mg  450 mg Oral Daily Wyline Beady, MD   450 mg at 11/05/22 0859    vancomycin (VANCOCIN) 750 mg in sodium chloride (NS) 0.9% 250 mL IVPB  750 mg Intravenous Q24H Neysa Hotter, MD           Allergies   Allergen Reactions    Oxycodone Nausea And Vomiting     Per Pt cannot tolerate oxycodone    Gabapentin Other (See Comments)     Shakes    Valacyclovir Nausea Only and Other (See Comments)       Social History:  Social History     Tobacco Use    Smoking status: Former     Current packs/day: 0.00     Average packs/day: 0.8 packs/day for 30.0 years (22.5 ttl pk-yrs)     Types: Cigarettes     Start date: 06/06/1976     Quit date: 06/06/2006     Years since quitting: 16.4    Smokeless tobacco: Former   Substance Use Topics    Alcohol use: Yes     Comment: only special occasions    Drug use: No       Family History   Problem Relation Age of Onset    Diabetes Mother     Alzheimer's disease Father     Diabetes Sister     Diabetes Sister     Diabetes Sister     Anesthesia problems Neg Hx     Bleeding Disorder Neg Hx        Review of Systems  10 systems were reviewed and are negative except as noted specifically in the HPI.    Objective:   BP 103/74  - Pulse 94  - Temp 37.2 ??C (99 ??F) (Axillary)  - Resp 25  - Ht 152.4 cm (5')  - SpO2 95%  - BMI 24.39 kg/m??     Intake/Output last 3 shifts:  No intake/output data recorded.    Physical Exam:  Vitals:    11/05/22 0412   BP: 103/74   Pulse: 94   Resp: 25   Temp:    SpO2:       General: ill appearing, lying in bed with active rigors  Neuro: Somnolent  Head: normocephalic, atraumatic  Cardiac: RRR   Lungs: Easy work of breathing on RA  Abdomen: Soft, distended (husband reports this is baseline), mild diffuse ttp  MSK: negative joint tenderness, no deformities  Skin: intact, good turgor    Most Recent Labs:  Lab Results   Component Value Date    WBC 12.6 (H) 11/05/2022    HGB 9.8 (L) 11/05/2022    HCT 28.8 (L) 11/05/2022    PLT 73 (L) 11/05/2022       Lab Results   Component Value  Date    NA 142 11/04/2022    K 4.4 11/04/2022    CL 111 (H) 11/04/2022    CO2 21.0 11/04/2022    BUN 45 (H) 11/04/2022    CREATININE 1.13 (H) 11/04/2022    CALCIUM 8.8 11/04/2022    MG 1.3 (L) 11/04/2022    PHOS 3.4 10/25/2022       IMAGING:  CT Abdomen Pelvis W IV Contrast    Result Date: 11/05/2022  EXAM: CT ABDOMEN PELVIS W CONTRAST ACCESSION: [id number] UN CLINICAL INDICATION: 72 years old with Altered mental status, s/p exploratory laparotomy  COMPARISON: CT abdomen pelvis 10/12/2022, renal transplant ultrasound 10/08/2022 TECHNIQUE: A helical CT scan of the abdomen and pelvis was obtained following IV contrast from the lung bases through the pubic symphysis. Images were reconstructed in the axial plane. Coronal and sagittal reformatted images were also provided for further evaluation. FINDINGS: LOWER CHEST: Bibasilar atelectasis/scarring. Cardiomegaly. Dense coronary artery calcifications. LIVER: Normal liver contour. Similar hypoattenuating lesion in hepatic segment 5/6 (3:37). BILIARY: The gallbladder is normal in appearance. No biliary ductal dilatation.  SPLEEN: Normal in size and contour. Similar appearance of a 1.6 cm hypoattenuating lesion in the posterior spleen, likely reflecting a cyst. There is an additional subcentimeter 4 mm hypoattenuating lesion (3:35), too small to characterize. PANCREAS: Normal pancreatic contour.  No focal lesions.  No ductal dilation. ADRENAL GLANDS: Normal appearance of the adrenal glands. KIDNEYS/URETERS: Symmetric renal enhancement.  No hydronephrosis.  No solid renal mass. Atrophic bilateral native kidneys. Similar appearance of nonobstructive punctate calculi and vascular calcifications. Left lower quadrant renal transplant. Subcentimeter hypoattenuating lesions within the renal transplant, too small to characterize. No hydronephrosis. Increased inflammatory stranding surrounding the transplant kidney, particularly around the hilum. 8.3 x 0.9 x 5.5 cm crescentic fluid collection within the soft tissues lateral to the transplant kidney (3:90, 4:33). There is increased inflammatory changes and thickening involving the adjacent oblique and transversus abdominis musculature. BLADDER: Decompressed. REPRODUCTIVE ORGANS: Hysterectomy. GI TRACT: Multiple mildly prominent gas and fluid-filled loops of small bowel are noted throughout the abdomen, measuring up to 3.1 cm. A short segment loop of small bowel in the mid abdomen with circumferential wall thickening and luminal narrowing (3:78) is identified. Gas and fluid are seen extending through this point. No evidence of transition point. Normal appendix. Colonic diverticulosis. PERITONEUM, RETROPERITONEUM AND MESENTERY: No free air.  No ascites.  No fluid collection. LYMPH NODES: No adenopathy. VESSELS: Hepatic and portal veins are patent.  Normal caliber aorta. Significant burden of atherosclerotic calcifications of the abdominal aorta and its branch vessels. BONES and SOFT TISSUES: Multilevel degenerative changes of the spine. Postsurgical changes of the ventral abdominal wall. Small fat-containing ventral hernia. Sequela of subcutaneous injections within the anterior abdominal wall.     --Increased inflammatory stranding surrounding the left lower quadrant transplant kidney with a 8.3 cm crescentic fluid collection within the overlying soft tissue. There is associated thickening and inflammatory stranding of the adjacent abdominal musculature. The findings may reflect abscess or superinfection of a previously described hematoma at this location on prior renal transplant ultrasound, however trace/no significant fluid collection was noted at this location on noncontrast CT abdomen/pelvis 10/12/2022. --Mildly prominent gas and fluid-filled loops of small bowel are noted throughout the abdomen. A focal area of bowel wall thickening and luminal narrowing is noted within the mid abdomen without evidence of complete obstruction. Findings are nonspecific and could reflect enteritis or potentially stricturing/adhesive disease at the area of bowel wall thickening in the setting of prior high-grade  bowel obstruction and laparotomy. --Additional chronic and incidental findings, as detailed within the body of the report. ++++++++++++++++++++ MODIFIED REPORT: (11/05/2022 8:28 AM) This report has been modified from its preliminary version; you may check the prior versions of radiology report, results history link for prior report versions (if they were previously visible in Epic). The critical edited findings were communicated via phone call with   Dr. Francesco Sor  by Dr. Sabino Snipes on 11/05/2022 8:28 AM. -----------------------------------------------     CT Head Wo Contrast    Result Date: 11/05/2022  EXAM: Computed tomography, head or brain without contrast material. DATE: 11/05/2022 3:13 AM ACCESSION: [id number] UN DICTATED: 11/05/2022 3:21 AM INTERPRETATION LOCATION: [facility] CLINICAL INDICATION: 72 years old Female with Altered mental status  COMPARISON: None TECHNIQUE: Axial CT images of the head  from skull base to vertex without contrast. FINDINGS: There are scattered hypodense foci within the periventricular and deep white matter.  These are nonspecific but commonly associated with small vessel ischemic changes. There is no midline shift. No mass lesion. There is no evidence of acute infarct. No acute intracranial hemorrhage. No fractures are evident. The sinuses are pneumatized. Maxillary sinus mucosal retention cyst.     No acute intracranial abnormality.     ECG 12 Lead    Result Date: 11/05/2022  NORMAL SINUS RHYTHM POSSIBLE LEFT ATRIAL ENLARGEMENT ST & T WAVE ABNORMALITY, CONSIDER LATERAL ISCHEMIA ABNORMAL ECG WHEN COMPARED WITH ECG OF 21-Oct-2022 13:46, VENT. RATE HAS INCREASED BY  33 BPM    XR Chest Portable    Result Date: 11/04/2022  EXAM: XR CHEST PORTABLE ACCESSION: [id number] UN CLINICAL INDICATION: ALTERED MENTAL STATUS  TECHNIQUE: Single View AP Chest Radiograph. COMPARISON: 10/07/2022 FINDINGS: Low lung volumes with bibasilar atelectasis. Bibasilar opacities. No pleural effusion or pneumothorax. Normal heart size and mediastinal contours. Vascular stent projects over the left brachiocephalic and subclavian vessels.     Bibasilar opacities which may represent edema or infection.

## 2022-11-05 NOTE — Unmapped (Signed)
Brought in by husband for fever of 102 as well as N/V/D. Pt lethargic.

## 2022-11-05 NOTE — Unmapped (Signed)
ED ULTRASOUND PIV COMPLICATION NOTE    Indications: Poor venous access.    No superficial veins were assessed for landmark attempt or landmark attempts were unsuccessful prior to ultrasound assessment. Ultrasound guidance was utilized in attempt to gain access.    Procedure Details:    Identity of the patient via name, medical record number and date of birth was confirmed.    Patients renal assessment was performed:  Not required    Current medication regimen was not reviewed.    Please refer to midline pharmacy guidelines for target veins deeper than .5 cm or subfascial.     The availability of the correct equipment was verified. The vein was identified for ultrasound catheter insertion.    Field was prepared with necessary supplies and equipment. Probe cover and sterile gel were utilized. Insertion site was prepped with Chlorhexidinesolution and allowed to dry. The catheter extension was primed with normal saline.    Vein diameter: 28  Vein depth: 1cm     Two attempts were made without success.  This RN placed x2 PIV [place], pt with poor access and only R arm to be used. Procedure was aborted.     Thank you,    Sharol Harness, RN   RN ED Ultrasound Resource Nurse

## 2022-11-05 NOTE — Unmapped (Signed)
IMMUNOCOMPROMISED HOST INFECTIOUS DISEASE CONSULT NOTE    Julie [name] is being seen in consultation at the request of Neysa Hotter, MD for evaluation of UTI, known perinephric fluid collection, and AMS.    Assessment/Recommendations:    Julie [name] is a 72 y.o. 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 Had perinephric drain in place postoperatively, removed 08/30/22  - Immunosuppression: Tacrolimus (goal 7-9), MM 500F, Pred 5  - Prophylaxis: valganciclovir x 6 months (mod risk), TMP+SMX x 6 months     Pertinent comorbidities:  ESRD was on PD and HD  DM II (A1c 5.6 on 08/06/22)  CAD  HLD  SBO 10/07/22 s/p ex lap 10/13/22 w/ lysis of adhesions     Pertinent exposures:  Originally from [place]   Treated with ivermectin at the ID clinic in 2018     Summary of pertinent prior infections:  #History of Shingles 2017  #04/2018 Dialysis fluid [place] R: Ampicillin, Unasyn, Cefazolin,S: Ceftriaxone, Gentamicin, Levo, PipTazo, Tobra  #Donor urine cx with C. Dubliensis 08/05/22, <10K, negative recipient samples, negative donor blood cx, treated with 2 weeks PO fluconazole  #E. Coli bacteremia and UTI 08/21/22  - Ucx/Bcx 1/29 E. Coli, R amp, amp/sulb, TMP/SMX. 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 Coli (no AST) - fosfomycin susceptible  - 3/14 Bcx - E. Coli; (S: Erta, Gent, Mero, Tobra, Cefepime SDD; I: ceftaz; R: Cipro/Levo, Cefazolin, Ceftriaxone, Amp)  - 3/17: Renal U/S 8.1 x 0.8 x 2.3 cm perinephric collection new from prior, considering small hematoma, complicated seroma, or less likely abscess  - 3/20: CT A/P: perinephric stranding, 5.3 x 2.0 cm ill-defined perinephric collection limited evaluation without contrast. No stones seen. 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 (3rd episode since transplant in January - see above)  - Presented to ED on 4/13 with AMS and fever of 102 (very similar presentation to prior hospitalizations for bacteremia 2/2 UTI)   - Blood Cx (+) GNRs (speciation pending - likely ESBL E coli)  - UA infectious appearing, Urine Cx - E Coli  Rx: 4/13 Vancomycin/Meropenem --->     #Diarrhea  -4/13: C diff (-) GIPP pending     # Perinephric fluid collection 10/05/22, re-accumulated after JP drain removal 08/30/22- low suspicion for infection at this time   - 3/19 IR guided fluid collection drainage: bacterial and AFB cultures negative, Cr 1.5 (not suggestive of urinoma)  - 4/13: . Transplanted renal U/S with perinephric fluid - decreased in size from prior   - 4/13 CT Ab/P with IV contrast - increased inflammatory stranding surrounding the LLQ transplanted kidney with crescentric fluid collection. Mildly prominent gas and fluid filled loops of small bowel.   - 4/14 Evaluation by transplant surgery - no surgical indication and they signed off.    Rx: none     Antimicrobial allergy/intolerance:  Penicillin (see allergy list for nuances)  Valacyclovir - nausea       RECOMMENDATIONS    Strong suspicion for repeat ESBL bacteremia 2/2 UTI. Please ensure that Urogyn sees patient during her hospitalization to help assist with interventions to decrease risk for UTI. Patient has been in and out of the hospital since 07/2022 and unable to see them in the outpatient clinic.     Diagnosis  FU Blood Cx 4/13 - GNR (speciation pending -  likely ESBL E Coli)  FU Urine Cx 4/13 - E Coli   C Diff (-). GIPP pending  CMV PCR, BK virus pending  Head CT - no acute abnormalities     Management  Continue IV Meropenem, per pharmacy dosing  Continue IV Vancomycin, per pharmacy dosing (Goal trough: 10-15)   Please have Urogyn see patient while hospitalized to help provide interventions to reduce the risk of recurrence   Would re-explore coverage for fosfomycin (and possibly [place] coverage) in light of recurrent hospitalizations    Antimicrobial prophylaxis required for host deficiency: transplant immunosuppression  Continue Valgancyclovir (CMV ppx)   Continue Bactrim (PJP ppx)     Intensive toxicity monitoring for prescription antimicrobials   CBC w/diff at least once per week  CMP at least once per week  clinical assessments for rashes or other skin changes    The [facility] ID service will continue to follow.           Please page the ID Transplant/Liquid Oncology Fellow consult at [phone] with questions.  Patient discussed with Dr. Drue Second.    Inocencio Homes, DO  [facility]    History of Present Illness:      External record(s): Primary team note: patient's mentation slowly improving as compared to admission Consultant note(s): no plans for surgical intervention of perinephric fluid collection  .    Independent historian(s): no independent historian required.       Julie [name] is a 72 y/o F w/ PMHx of recent DDKT 08/06/22 c/b DGF, recent E. Coli UTI/Bacteremia 08/21/22 and then recurrent ESBL E Coli Bacteremia/UTI 09/2022 treated with 2 weeks of Meropenem. During that hospitalization, also noted to have perinephric fluid collection that measured 8.1 x 0.8 x 2.3 cm. IR placed a drain on 3/19 where cultures did not grow any organisms. The drain was removed prior to discharge. Her hospitalization was also complicate by a SBO for which she underwent ex-lap where she underwent reduction of small bowel volvulus on 10/13/22.     Patient now representing to the ED on 4/13 after husband contacted her transplant coordinator and notified them that patient had a temperature of 102.4 with associated vomiting and 3 bouts of diarrhea. On presentation to the ED, patient was unable to provide any history in s/o AMS so husband provided history. He mentioned that she had 3 non-bloody emesis episodes and diarrhea so he checked her temperature and found it to be 102 using an oral thermometer. He was unsure if she had endorsed any UTI symptoms like burning with urination or increased freuqency of urination. Patient never seen urogyn in the outpatient setting.     Upon presentation to the ED, patient febrile to 101.9, tachycardic, normotensive, and adequate SpO2s on RA. WBC elevated from her baseline at 10.1. Cr - 1.34 (BL: 1), LFTs WNL., RPP pan negative, UA - infectious appearing with large leuk esterase/ (+) nitrite/ 86 WBCs. Urine Cx - E Coli. Blood Cx - GNRs, CXR with bibasilar opacities.  CMV PCR, BK Virus, Serum Crypto (-), C Diff (-), and GIPP pending. Head CT with no acute abnormalities. Transplanted renal U/S with perinephric fluid - decreased in size. CT Ab/P with IV contrast - increased inflammatory stranding surrounding the LLQ transplanted kidney with crescentric fluid collection. Mildly prominent gas and fluid filled loops of small bowel. Patient evaluated by Transplant surgery on 4/14 where it is thought that the perinephric fluid collection is not contributing to patient's symptomatology and therefore no surgical indication indicated and they signed off.      On my encounter, patient is laying in bed, calm. There was no family at bedside. Nursing staff mentioned that the husband is usually in the room but must have stepped out. I attempted to talk to the patient in English but she wouldn't respond. I then utilized a [place] interpreter for which patient would intermittently shake her head yes and no to questioning and would follow commands (squeezing my fingers, wiggling her toes. Patient shook her head yes when I asked her if she was in pain but then would not point to where she was having pain. On exam, patient did not wince or show other signs of pain during palpation of the chest and abdomen.    Allergies:  Allergies   Allergen Reactions    Oxycodone Nausea And Vomiting     Per Pt cannot tolerate oxycodone    Gabapentin Other (See Comments)     Shakes    Valacyclovir Nausea Only and Other (See Comments)       Medications:   Antimicrobials:  Anti-infectives (From admission, onward)      Start     Dose/Rate Route Frequency Ordered Stop    11/06/22 0900  valGANciclovir (VALCYTE) tablet 450 mg         450 mg Oral Every other day 11/05/22 0951      11/05/22 0944  Vancomycin - Pharmacy dosing by levels          Other Pharmacy Dosing 11/05/22 0944      11/05/22 0800  meropenem (MERREM) 2 g in sodium chloride (NS) 0.9 % 100 mL IVPB         2 g  300 mL/hr over 30 Minutes Intravenous Every 12 hours 11/05/22 0710 11/12/22 0759            Current/Prior immunomodulators per problem list. No change since admission.    Other medications reviewed.     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)     since July 2017    Hypertension     Hypothyroidism (acquired)     Kidney transplant status, cadaveric 08/08/2022    Type 2 diabetes mellitus (CMS-HCC)      No additional immunocompromising condition except as above.    Past Surgical History:   Procedure Laterality Date    CATARACT EXTRACTION      HYSTERECTOMY      OOPHORECTOMY      PERITONEAL CATHETER INSERTION      PR LAP INSERTION TUNNELED INTRAPERITONEAL CATHETER N/A 09/27/2018    Procedure: LAPAROSCOPY, SURGICAL; WITH INSERTION OF INTRAPERITONEAL CANNULA OR CATHETER, PERMANENT;  Surgeon: Leona Carry, MD;  Location: MAIN OR [facility];  Service: Transplant    PR LAP INSERTION TUNNELED INTRAPERITONEAL CATHETER N/A 04/08/2019 Procedure: LAPAROSCOPY, SURGICAL; WITH INSERTION OF INTRAPERITONEAL CANNULA OR CATHETER,  PERMANENT;  Surgeon: Leona Carry, MD;  Location: MAIN OR [facility];  Service: Transplant    PR LAP REVISE INTRAPERITONEAL CATHETER N/A 08/26/2019    Procedure: LAPAROSCOPY, SURGICAL; W/REVIS PREV PLACED INTRAPERITONEAL CANNULA/CATH, REMOV INTRALUMIN OBSTRUCT MATERIAL;  Surgeon: Leona Carry, MD;  Location: MAIN OR [facility];  Service: Transplant    PR REDUCE VOLVULUS,INTUSS,INTERN HERNIA N/A 10/13/2022    Procedure: REDUCTION OF VOLVULUS, INTUSSUSCEPTION, INTERNAL HERNIA, BY LAPAROTOMY;  Surgeon: Suella Broad, MD;  Location: MAIN OR [place];  Service: Trauma    PR REMOVAL TUNNELED INTRAPERITONEAL CATHETER N/A 05/06/2018    Procedure: REMOVAL OF PERMANENT INTRAPERITONEAL CANNULA OR CATHETER;  Surgeon: Leona Carry, MD;  Location: MAIN OR [facility];  Service: Transplant    PR REMOVE PERITONEAL FOREIGN BODY N/A 04/08/2019    Procedure: Removal Of Peritoneal Of Foreign Body From Peritoneal Cavity;  Surgeon: Leona Carry, MD;  Location: MAIN OR [facility];  Service: Transplant    PR TRANSPLANT,PREP RENAL GRAFT/ARTERIAL Right 08/06/2022    Procedure: [facility] RECONSTRUCTION CADAVER/LIVING DONOR RENAL ALLOGRAFT PRIOR TO TRANSPLANT; ARTERIAL ANASTOMOSIS EAC;  Surgeon: Toledo, Lilyan Punt, MD;  Location: MAIN OR [facility];  Service: Transplant    PR TRANSPLANTATION OF KIDNEY Right 08/06/2022    Procedure: RENAL ALLOTRANSPLANTATION, IMPLANTATION OF GRAFT; WITHOUT RECIPIENT NEPHRECTOMY;  Surgeon: Toledo, Lilyan Punt, MD;  Location: MAIN OR [facility];  Service: Transplant    TUBAL LIGATION       Denies prior surgeries with retained hardware.    Social History:  Tobacco use:   reports that she quit smoking about 16 years ago. Her smoking use included cigarettes. She started smoking about 46 years ago. She has a 22.5 pack-year smoking history. She has quit using smokeless tobacco.   Alcohol use:    reports current alcohol use.   Drug use:    reports no history of drug use.   Living situation:  Lives with spouse/partner   Residence:   [place]   Birth place  From [place]   [place] travel:   No [place] travel outside of [place]   International travel:   Has traveled to [place]   Military service:  Has not served in the [employer]   Employment:  Unemployed   Pets and animal exposure:  No animal exposure   Insect exposure:  No tick exposure   Hobbies:  Denies unusual environmental exposures   TB exposures:  No known TB exposure   Sexual history:  Not sexually active   Other significant exposures:  No exposure to young children     Family History:  Family History   Problem Relation Age of Onset    Diabetes Mother     Alzheimer's disease Father     Diabetes Sister     Diabetes Sister     Diabetes Sister     Anesthesia problems Neg Hx     Bleeding Disorder Neg Hx           Vital Signs last 24 hours:  Temp:  [37.2 ??C (99 ??F)-38.8 ??C (101.9 ??F)] 37.5 ??C (99.5 ??F)  Heart Rate:  [88-112] 88  SpO2 Pulse:  [86] 86  Resp:  [16-30] 20  BP: (103-174)/(38-100) 174/38  MAP (mmHg):  [80-83] 80  SpO2:  [94 %-100 %] 100 %    Physical Exam:  Patient Lines/Drains/Airways Status       Active Active Lines, Drains, & Airways       Name Placement date Placement time Site Days  Peripheral IV 11/04/22 Anterior;Right;Upper Arm 11/04/22  2159  Arm  less than 1    Arteriovenous Fistula - Vein Graft  Access 08/24/16 Left;Upper Arm 08/24/16  --  Arm  2264                  Const [x]  vital signs above    []  NAD, non-toxic appearance []  Chronically ill-appearing, non-distressed  Lying in bed, calm, in NAD. No family at bedside.       Eyes [x]  Lids normal bilaterally, conjunctiva anicteric and noninjected OU     [] PERRL  [] EOMI        ENMT [x]  Normal appearance of external nose and ears, no nasal discharge        [x]  MMM, no lesions on lips or gums [x]  No thrush, leukoplakia, oral lesions  []  Dentition good []  Edentulous []  Dental caries present  []  Hearing normal  []  TMs with good light reflexes bilaterally         Neck [x]  Neck of normal appearance and trachea midline        []  No thyromegaly, nodules, or tenderness   []  Full neck ROM  No neck stiffness noted.       Lymph []  No LAD in neck     []  No LAD in supraclavicular area     []  No LAD in axillae   []  No LAD in epitrochlear chains     []  No LAD in inguinal areas        CV [x]  RRR            [x]  No peripheral edema     []  Pedal pulses intact   [x]  No abnormal heart sounds appreciated   []  Extremities WWP         Resp [x]  Normal WOB at rest on RA   []  No breathlessness with speaking, no coughing  [x]  CTA anteriorly    [x]  CTA posteriorly          GI []  Normal inspection, NTND   [x]  NABS     []  No umbilical hernia on exam       []  No hepatosplenomegaly     []  Inspection of perineal and perianal areas normal  (+) well healing midline surgical scar from prior ex-lap with staples in place - no surrounding erythema or evidence of purulence. Patient with no TTP. NABS x4 quadrants.       GU [x]  Normal external genitalia     [] No urinary catheter present in urethra   []  No CVA tenderness    []  No tenderness over renal allograft  No foley catheter in place. Noted that patient's sheets were wet with urine.       MSK [x]  No clubbing or cyanosis of hands       []  No vertebral point tenderness  []  No focal tenderness or abnormalities on palpation of joints in RUE, LUE, RLE, or LLE        Skin [x]  No rashes, lesions, or ulcers of visualized skin     []  Skin warm and dry to palpation   (+) fistula in LUE - thrill appreciated      Neuro [x]  Face expression symmetric  []  Sensation to light touch grossly intact throughout    [x]  Moves extremities equally    []  No tremor noted        []  CNs II-XII grossly intact     []  DTRs normal and symmetric throughout []  Gait unremarkable  Intermittently followed commands. Moved all extremities.       Psych []  Appropriate affect       []  Fluent speech         []  Attentive, good eye contact  []  Oriented to person, place, time          []  Judgment and insight are appropriate   Calm.         Data for Medical Decision Making     (11/05/2022) EKG QTcF 372    I discussed mgm't w/qualified health care professional(s) involved in case: Recs discussed with primary team .    I reviewed CBC results (WBC uptrending), chemistry results (Cr uptrending), and micro result(s) (4/13 Blood Cx - (+) GNR (speciation pending), 4/13 Urine Cx - pending).    I independently visualized/interpreted not done.       Recent Labs   Lab Units 11/05/22  0836 11/04/22  2153   WBC 10*9/L 12.6* 10.1   HEMOGLOBIN g/dL 9.8* 9.4*   PLATELET COUNT (1) 10*9/L 73* 86*   NEUTRO ABS 10*9/L  --  9.4*   LYMPHO ABS 10*9/L  --  0.4*   EOSINO ABS 10*9/L  --  0.0   SODIUM mmol/L 138 142   POTASSIUM mmol/L 4.0 4.4   BUN mg/dL 41* 45*   CREATININE mg/dL 1.61* 0.96*   GLUCOSE mg/dL 045 409   CALCIUM mg/dL 9.0 8.8   MAGNESIUM mg/dL  --  1.3*   PHOSPHORUS mg/dL 4.1  --    BILIRUBIN TOTAL mg/dL  --  0.3 - 0.3   AST U/L  --  34 - 31   ALT U/L  --  31 - 32       Lab Results   Component Value Date    Tacrolimus, Trough 9.0 10/25/2022    Tacrolimus Lvl 7.5 11/03/2022    Tacrolimus, Timed 1.9 11/05/2022       Microbiology:  Microbiology Results (last day)       Procedure Component Value Date/Time Date/Time    C. Difficile Assay [[id number]] Collected: 11/05/22 0817    Lab Status: In process Specimen: Stool  Updated: 11/05/22 1112    Blood Culture [[id number]]  (Abnormal) Collected: 11/04/22 2151    Lab Status: Preliminary result Specimen: Blood from 1 Peripheral Draw Updated: 11/05/22 1023     Blood Culture, Routine Positive Culture, Results to Follow     Gram Stain Result Gram negative rods (bacilli)    GI Pathogen Panel [[id number]] Collected: 11/05/22 0817    Lab Status: In process Specimen: Stool  Updated: 11/05/22 1016    Blood Culture [[id number]] Collected: 11/05/22 0838    Lab Status: In process Specimen: Blood from 1 Peripheral Draw Updated: 11/05/22 0859    Urine Culture [[id number]] Collected: 11/05/22 0159    Lab Status: In process Specimen: Urine from Catheterized-In and Out Catheter Updated: 11/05/22 0159    Respiratory Pathogen Panel [[id number]]  (Normal) Collected: 11/04/22 2154    Lab Status: Final result Specimen: Nasopharyngeal Swab Updated: 11/04/22 2257     Adenovirus Not Detected     Coronavirus HKU1 Not Detected     Coronavirus NL63 Not Detected     Coronavirus 229E Not Detected     Coronavirus OC43 PCR Not Detected     Metapneumovirus Not Detected     Rhinovirus/Enterovirus Not Detected     Influenza A Not Detected     Influenza B Not Detected     Parainfluenza 1 Not Detected     Parainfluenza  2 Not Detected     Parainfluenza 3 Not Detected     Parainfluenza 4 Not Detected     RSV Not Detected     Bordetella pertussis Not Detected     Comment: If B. pertussis/parapertussis infection is suspected, the Bordetella pertussis/parapertussis Qualitative PCR test should be ordered.        Bordetella parapertussis Not Detected     Chlamydophila (Chlamydia) pneumoniae Not Detected     Mycoplasma pneumoniae Not Detected     SARS-CoV-2 PCR Not Detected    Narrative:      This result was obtained using the FDA-cleared [employer] Respiratory 2.1 Panel. Performance characteristics have been established and verified by the Clinical Molecular Microbiology Laboratory, [facility]. This assay does not distinguish between rhinovirus and enterovirus. Lower respiratory specimens will not be tested for Bordetella pertussis/parapertussis. For nasopharyngeal swabs, cross-reactivity may occur between B. pertussis and non-pertussis Bordetella species. All positive B. pertussis results will be automatically confirmed using our in-house PCR assay.            Imaging:  US Renal Transplant W Doppler    Result Date: 11/05/2022  EXAM: US RENAL TRANSPLANT [place] ACCESSION: [id number] UN     CLINICAL INDICATION: 72 years old with UTI     COMPARISON: 11/05/2022 CT abdomen pelvis and 10/04/2012 ultrasound     TECHNIQUE:  Ultrasound views of the renal transplant were obtained using gray scale and color and spectral Doppler imaging. Views of the urinary bladder were obtained using gray scale and limited color Doppler imaging.     FINDINGS:     TRANSPLANTED KIDNEY: The renal transplant was located in the left lower quadrant. Normal size and echogenicity.  No solid masses or calculi. No perinephric collections identified. No hydronephrosis. Small volume perinephric fluid is identified. Previously identified perinephric collections are significantly decreased in size.     VESSELS: - Perfusion: Using power Doppler, normal perfusion was seen throughout the renal parenchyma. - Resistive indices in the renal transplant and main renal artery are mildly elevated and appear mildly decreased compared with prior examination. - Main renal artery/iliac artery: Patent - Main renal vein/iliac vein: Patent     BLADDER: Mildly distended limiting assessment.     OTHER: Dilated fluid-filled small bowel loops are identified adjacent to the left lower quadrant transplant kidney which were better assessed on the same day CT.         Left lower quadrant transplant kidney with patent vasculature. Resistive indices in the renal transplant and main renal artery are mildly elevated and appear mildly decreased compared with prior examination. Small volume perinephric fluid is identified. Previously identified perinephric collections are significantly decreased in size. Elevated main renal artery peak systolic velocity which is nonspecific. Attention on the follow-up. Dilated fluid-filled small bowel loops are identified adjacent to the left lower quadrant transplant kidney which were better assessed on the same day CT.                 Please see below for data measurements:     Transplant location: LLQ     Renal Transplant: Sagittal   13.2  cm; AP  7.6   cm; Transverse  6.5   cm     Segmental artery superior resistive index:   0.81 Segmental artery mid resistive index:   0.81 Segmental artery inferior resistive index:   0.84     Previous resistive indices range of segmental arteries: 0.77-0.87     Main renal artery peak systolic  velocity at anastomosis:   2.8  m/s Main renal artery hilum resistive index:  0.80  Main renal artery mid resistive index:   0.86 Main renal artery anastomosis resistive index:   0.90     Previous resistive indices range of main renal artery: 0.91-0.92     Main renal vein: patent     Iliac artery: Patent Iliac vein: Patent     Bladder volume prevoid:   55   mL Bladder volume postvoid: mL                 ECG 12 Lead    Result Date: 11/05/2022  NORMAL SINUS RHYTHM NONSPECIFIC ST-T WAVE ABNORMALITIES WHEN COMPARED WITH ECG OF 21-Oct-2022 13:46, VENT. RATE HAS INCREASED BY  33 BPM Confirmed by Schuyler Amor ([id number]) on 11/05/2022 11:35:00 AM    CT Abdomen Pelvis W IV Contrast    Result Date: 11/05/2022  EXAM: CT ABDOMEN PELVIS W CONTRAST ACCESSION: [id number] UN CLINICAL INDICATION: 72 years old with Altered mental status, s/p exploratory laparotomy      COMPARISON: CT abdomen pelvis 10/12/2022, renal transplant ultrasound 10/08/2022     TECHNIQUE: A helical CT scan of the abdomen and pelvis was obtained following IV contrast from the lung bases through the pubic symphysis. Images were reconstructed in the axial plane. Coronal and sagittal reformatted images were also provided for further evaluation.         FINDINGS:     LOWER CHEST: Bibasilar atelectasis/scarring. Cardiomegaly. Dense coronary artery calcifications.     LIVER: Normal liver contour. Similar hypoattenuating lesion in hepatic segment 5/6 (3:37).     BILIARY: The gallbladder is normal in appearance. No biliary ductal dilatation.      SPLEEN: Normal in size and contour. Similar appearance of a 1.6 cm hypoattenuating lesion in the posterior spleen, likely reflecting a cyst. There is an additional subcentimeter 4 mm hypoattenuating lesion (3:35), too small to characterize.     PANCREAS: Normal pancreatic contour.  No focal lesions.  No ductal dilation.     ADRENAL GLANDS: Normal appearance of the adrenal glands.     KIDNEYS/URETERS: Symmetric renal enhancement.  No hydronephrosis.  No solid renal mass. Atrophic bilateral native kidneys. Similar appearance of nonobstructive punctate calculi and vascular calcifications.     Left lower quadrant renal transplant. Subcentimeter hypoattenuating lesions within the renal transplant, too small to characterize. No hydronephrosis. Increased inflammatory stranding surrounding the transplant kidney, particularly around the hilum. 8.3 x 0.9 x 5.5 cm crescentic fluid collection within the soft tissues lateral to the transplant kidney (3:90, 4:33). There is increased inflammatory changes and thickening involving the adjacent oblique and transversus abdominis musculature.     BLADDER: Decompressed.     REPRODUCTIVE ORGANS: Hysterectomy.     GI TRACT: Multiple mildly prominent gas and fluid-filled loops of small bowel are noted throughout the abdomen, measuring up to 3.1 cm. A short segment loop of small bowel in the mid abdomen with circumferential wall thickening and luminal narrowing (3:78) is identified. Gas and fluid are seen extending through this point. No evidence of transition point. Normal appendix. Colonic diverticulosis.     PERITONEUM, RETROPERITONEUM AND MESENTERY: No free air.  No ascites.  No fluid collection.     LYMPH NODES: No adenopathy.     VESSELS: Hepatic and portal veins are patent.  Normal caliber aorta. Significant burden of atherosclerotic calcifications of the abdominal aorta and its branch vessels.     BONES and SOFT TISSUES: Multilevel degenerative changes of the spine.  Postsurgical changes of the ventral abdominal wall. Small fat-containing ventral hernia. Sequela of subcutaneous injections within the anterior abdominal wall.         --Increased inflammatory stranding surrounding the left lower quadrant transplant kidney with a 8.3 cm crescentic fluid collection within the overlying soft tissue. There is associated thickening and inflammatory stranding of the adjacent abdominal musculature. The findings may reflect abscess or superinfection of a previously described hematoma at this location on prior renal transplant ultrasound, however trace/no significant fluid collection was noted at this location on noncontrast CT abdomen/pelvis 10/12/2022.     --Mildly prominent gas and fluid-filled loops of small bowel are noted throughout the abdomen. A focal area of bowel wall thickening and luminal narrowing is noted within the mid abdomen without evidence of complete obstruction. Findings are nonspecific and could reflect enteritis or potentially stricturing/adhesive disease at the area of bowel wall thickening in the setting of prior high-grade bowel obstruction and laparotomy.     --Additional chronic and incidental findings, as detailed within the body of the report.     ++++++++++++++++++++ MODIFIED REPORT: (11/05/2022 8:28 AM) This report has been modified from its preliminary version; you may check the prior versions of radiology report, results history link for prior report versions (if they were previously visible in Epic). The critical edited findings were communicated via phone call with   Dr. Francesco Sor  by Dr. Sabino Snipes on 11/05/2022 8:28 AM.     -----------------------------------------------                 CT Head Wo Contrast    Result Date: 11/05/2022  EXAM: Computed tomography, head or brain without contrast material. DATE: 11/05/2022 3:13 AM ACCESSION: [id number] UN DICTATED: 11/05/2022 3:21 AM INTERPRETATION LOCATION: [facility]     CLINICAL INDICATION: 73 years old Female with Altered mental status      COMPARISON: None     TECHNIQUE: Axial CT images of the head  from skull base to vertex without contrast.     FINDINGS: There are scattered hypodense foci within the periventricular and deep white matter.  These are nonspecific but commonly associated with small vessel ischemic changes. There is no midline shift. No mass lesion. There is no evidence of acute infarct. No acute intracranial hemorrhage. No fractures are evident. The sinuses are pneumatized. Maxillary sinus mucosal retention cyst.         No acute intracranial abnormality.         XR Chest Portable    Result Date: 11/04/2022  EXAM: XR CHEST PORTABLE ACCESSION: [id number] UN     CLINICAL INDICATION: ALTERED MENTAL STATUS      TECHNIQUE: Single View AP Chest Radiograph.     COMPARISON: 10/07/2022     FINDINGS:     Low lung volumes with bibasilar atelectasis. Bibasilar opacities. No pleural effusion or pneumothorax.     Normal heart size and mediastinal contours. Vascular stent projects over the left brachiocephalic and subclavian vessels.             Bibasilar opacities which may represent edema or infection.          Serologies:  Lab Results   Component Value Date    CMV IGG Positive (A) 08/06/2022    EBV VCA IgG Antibody Positive (A) 08/06/2022    HIV Antigen/Antibody Combo Nonreactive 08/06/2022    Hep A IgG Reactive (A) 06/06/2022    Hep B Surface Ag Nonreactive 08/06/2022    Hep B S Ab Reactive (A) 08/06/2022  Hep B Surf Ab Quant 265.82 (H) 08/06/2022    Hep B Core Total Ab Reactive (A) 08/06/2022    Hepatitis C Ab Nonreactive 08/06/2022    RPR Nonreactive 08/06/2022    HSV 1 IgG Positive (A) 08/06/2022    HSV 2 IgG Negative 08/06/2022    Varicella IgG Positive 08/06/2022    Rubella IgG Scr Positive 06/06/2022    Toxoplasma Gondii IgG Negative 06/06/2022    Quantiferon TB Gold Plus Interpretation Negative 06/06/2022    Quantiferon Mitogen Minus Nil 9.92 06/06/2022    Quantiferon Antigen 1 minus Nil 0.17 06/06/2022       Immunizations:  Immunization History   Administered Date(s) Administered    COVID-19 VAC,BIVALENT(68YR UP),PFIZER 09/04/2019, 09/25/2019    COVID-19 VAC,MRNA,TRIS(12Y UP)(PFIZER)(GRAY CAP) 09/04/2019, 09/25/2019, 11/27/2020    COVID-19 VACC,MRNA,(PFIZER)(PF) 09/04/2019, 09/25/2019, 04/29/2020    Covid-19 Vacc, Unspecified 09/04/2019, 09/04/2019, 09/25/2019, 09/25/2019    HEPATITIS B VACCINE ADULT,IM(ENERGIX B, RECOMBIVAX) 04/27/2017    Hepatitis B Vaccine, Unspecified Formulation 04/27/2017    INFLUENZA INJ MDCK PF, QUAD,(FLUCELVAX)(58MO AND UP EGG FREE) 06/29/2017    INFLUENZA QUAD HIGH DOSE 67YRS+(FLUZONE) 04/05/2016, 04/27/2017, 05/31/2018, 04/10/2019    Influenza Virus Vaccine, unspecified formulation 04/24/2015, 05/26/2015, 04/05/2016, 04/05/2016, 04/27/2017, 04/27/2017, 05/31/2018, 05/31/2018, 04/10/2019, 04/10/2019, 05/09/2019, 04/29/2020, 05/09/2021, 05/09/2021, 04/24/2022, 04/26/2022    PNEUMOCOCCAL POLYSACCHARIDE 23-VALENT 07/09/2015    PPD Test 12/19/2015, 10/24/2016, 11/26/2017, 03/08/2019, 01/26/2020, 12/22/2020, 01/04/2022    Pneumococcal Conjugate 13-Valent 07/31/2018, 08/02/2018    Pneumococcal vaccine, Unspecified Formulation 06/28/2016, 08/26/2021    Pneumococcal, Unspecified Formulation 08/26/2021    TdaP 09/25/2016

## 2022-11-05 NOTE — Unmapped (Signed)
Vancomycin Therapeutic Monitoring Pharmacy Note    Julie [name] is a 72 y.o. female continuing vancomycin. Date of therapy initiation: 11/04/22    Indication: Urinary Tract Infection (UTI)    Prior Dosing Information:  1g vanc x1  -> stopped scheduled vanc given rise in SCr, eCrCL ~25 ml/min    Goals:  Therapeutic Drug Levels  Vancomycin trough goal: 10-15 mg/L    Additional Clinical Monitoring/Outcomes  Renal function, volume status (intake and output)    Results: Vancomycin level 14.2 mg/L, drawn appropriately - random level @~noon    Wt Readings from [name] 1 Encounters:   10/25/22 56.7 kg (124 lb 14.3 oz)     Creatinine   Date Value Ref Range Status   11/05/2022 1.56 (H) 0.55 - 1.02 mg/dL Final   16/04/9603 5.40 (H) 0.55 - 1.02 mg/dL Final   98/05/9146 8.29 (H) 0.57 - 1.00 mg/dL Final        Pharmacokinetic Considerations and Significant Drug Interactions:  Adult (estimated initial): Vd = 40.3 L, ke = 0.0462 hr-1  Concurrent nephrotoxic meds:  none    Assessment/Plan:  Recommendation(s)  Given rise in SCr/eCrCl will continue with vancomycin dosing by levels to start  Estimated trough on recommended regimen: Not applicable - dosing by level    Follow-up  Level due:  random AM vanc level tomorrow   A pharmacist will continue to monitor and order levels as appropriate    Please page service pharmacist with questions/clarifications.    Vertis Kelch, PharmD

## 2022-11-06 DIAGNOSIS — R4189 Other symptoms and signs involving cognitive functions and awareness: Principal | ICD-10-CM

## 2022-11-06 DIAGNOSIS — R29818 Other symptoms and signs involving the nervous system: Principal | ICD-10-CM

## 2022-11-06 DIAGNOSIS — Z940 Kidney transplant status: Principal | ICD-10-CM

## 2022-11-06 DIAGNOSIS — N390 Urinary tract infection, site not specified: Principal | ICD-10-CM

## 2022-11-06 LAB — SLIDE REVIEW

## 2022-11-06 LAB — CBC W/ AUTO DIFF
BASOPHILS ABSOLUTE COUNT: 0 10*9/L (ref 0.0–0.1)
BASOPHILS RELATIVE PERCENT: 0.3 %
EOSINOPHILS ABSOLUTE COUNT: 0 10*9/L (ref 0.0–0.5)
EOSINOPHILS RELATIVE PERCENT: 0.4 %
HEMATOCRIT: 22.2 % — ABNORMAL LOW (ref 34.0–44.0)
HEMOGLOBIN: 7.5 g/dL — ABNORMAL LOW (ref 11.3–14.9)
LYMPHOCYTES ABSOLUTE COUNT: 0.4 10*9/L — ABNORMAL LOW (ref 1.1–3.6)
LYMPHOCYTES RELATIVE PERCENT: 6.4 %
MEAN CORPUSCULAR HEMOGLOBIN CONC: 33.7 g/dL (ref 32.0–36.0)
MEAN CORPUSCULAR HEMOGLOBIN: 32.3 pg (ref 25.9–32.4)
MEAN CORPUSCULAR VOLUME: 95.8 fL — ABNORMAL HIGH (ref 77.6–95.7)
MEAN PLATELET VOLUME: 8.5 fL (ref 6.8–10.7)
MONOCYTES ABSOLUTE COUNT: 0.2 10*9/L — ABNORMAL LOW (ref 0.3–0.8)
MONOCYTES RELATIVE PERCENT: 3.6 %
NEUTROPHILS ABSOLUTE COUNT: 6.2 10*9/L (ref 1.8–7.8)
NEUTROPHILS RELATIVE PERCENT: 89.3 %
PLATELET COUNT: 47 10*9/L — ABNORMAL LOW (ref 150–450)
RED BLOOD CELL COUNT: 2.32 10*12/L — ABNORMAL LOW (ref 3.95–5.13)
RED CELL DISTRIBUTION WIDTH: 15.5 % — ABNORMAL HIGH (ref 12.2–15.2)
WBC ADJUSTED: 6.9 10*9/L (ref 3.6–11.2)

## 2022-11-06 LAB — BASIC METABOLIC PANEL
ANION GAP: 10 mmol/L (ref 5–14)
BLOOD UREA NITROGEN: 33 mg/dL — ABNORMAL HIGH (ref 9–23)
BUN / CREAT RATIO: 26
CALCIUM: 7 mg/dL — ABNORMAL LOW (ref 8.7–10.4)
CHLORIDE: 113 mmol/L — ABNORMAL HIGH (ref 98–107)
CO2: 20 mmol/L (ref 20.0–31.0)
CREATININE: 1.25 mg/dL — ABNORMAL HIGH
EGFR CKD-EPI (2021) FEMALE: 46 mL/min/{1.73_m2} — ABNORMAL LOW (ref >=60)
GLUCOSE RANDOM: 123 mg/dL (ref 70–179)
POTASSIUM: 2.8 mmol/L — ABNORMAL LOW (ref 3.4–4.8)
SODIUM: 143 mmol/L (ref 135–145)

## 2022-11-06 LAB — BK VIRUS QUANTITATIVE PCR, BLOOD: BK BLOOD RESULT: NOT DETECTED

## 2022-11-06 LAB — VANCOMYCIN, RANDOM: VANCOMYCIN RANDOM: 6.3 ug/mL

## 2022-11-06 LAB — PHOSPHORUS: PHOSPHORUS: 2.7 mg/dL (ref 2.4–5.1)

## 2022-11-06 LAB — CMV DNA, QUANTITATIVE, PCR: CMV VIRAL LD: NOT DETECTED

## 2022-11-06 LAB — TACROLIMUS LEVEL, TROUGH: TACROLIMUS, TROUGH: 1.1 ng/mL — ABNORMAL LOW (ref 5.0–15.0)

## 2022-11-06 MED ADMIN — sulfamethoxazole-trimethoprim (BACTRIM) 400-80 mg tablet 80 mg of trimethoprim: 1 | ORAL | @ 15:00:00 | Stop: 2023-11-02

## 2022-11-06 MED ADMIN — potassium chloride 10 mEq in 100 mL IVPB: 10 meq | INTRAVENOUS | @ 23:00:00 | Stop: 2022-11-06

## 2022-11-06 MED ADMIN — meropenem (MERREM) 500 mg in sodium chloride 0.9 % (NS) 100 mL IVPB-MBP: 500 mg | INTRAVENOUS | @ 15:00:00 | Stop: 2022-11-06

## 2022-11-06 MED ADMIN — sodium chloride 0.9% (NS) bolus 250 mL: 250 mL | INTRAVENOUS | @ 19:00:00 | Stop: 2023-02-13

## 2022-11-06 MED ADMIN — acyclovir (ZOVIRAX) 455 mg in sodium chloride (NS) 0.9 % 100 mL IVPB: 10 mg/kg | INTRAVENOUS | @ 18:00:00 | Stop: 2022-11-06

## 2022-11-06 MED ADMIN — atorvastatin (LIPITOR) tablet 10 mg: 10 mg | ORAL | @ 15:00:00

## 2022-11-06 MED ADMIN — sodium chloride 0.9% (NS) bolus 250 mL: 250 mL | INTRAVENOUS | @ 18:00:00 | Stop: 2023-02-13

## 2022-11-06 MED ADMIN — acetaminophen (TYLENOL) tablet 650 mg: 650 mg | ORAL | @ 17:00:00 | Stop: 2022-11-06

## 2022-11-06 MED ADMIN — estradiol (ESTRACE) 0.01 % (0.1 mg/gram) vaginal cream 2 g: 2 g | VAGINAL | @ 01:00:00 | Stop: 2022-11-19

## 2022-11-06 MED ADMIN — tacrolimus (PROGRAF) capsule 1 mg: 1 mg | ORAL | @ 15:00:00 | Stop: 2022-11-06

## 2022-11-06 MED ADMIN — valGANciclovir (VALCYTE) tablet 450 mg: 450 mg | ORAL | @ 15:00:00

## 2022-11-06 MED ADMIN — tacrolimus (PROGRAF) capsule 0.5 mg: .5 mg | ORAL | @ 01:00:00

## 2022-11-06 MED ADMIN — vancomycin (VANCOCIN) 750 mg in sodium chloride (NS) 0.9% 250 mL IVPB: 750 mg | INTRAVENOUS | @ 19:00:00 | Stop: 2022-11-18

## 2022-11-06 MED ADMIN — meropenem (MERREM) 2 g in sodium chloride (NS) 0.9 % 100 mL IVPB: 2 g | INTRAVENOUS | @ 02:00:00 | Stop: 2022-11-12

## 2022-11-06 MED ADMIN — predniSONE (DELTASONE) tablet 5 mg: 5 mg | ORAL | @ 15:00:00

## 2022-11-06 MED ADMIN — potassium chloride 10 mEq in 100 mL IVPB: 10 meq | INTRAVENOUS | @ 22:00:00 | Stop: 2022-11-06

## 2022-11-06 MED ADMIN — levothyroxine (SYNTHROID) tablet 112 mcg: 112 ug | ORAL | @ 10:00:00

## 2022-11-06 MED ADMIN — insulin lispro (HumaLOG) injection 0-20 Units: 0-20 [IU] | SUBCUTANEOUS | @ 22:00:00

## 2022-11-06 MED ADMIN — aspirin chewable tablet 81 mg: 81 mg | ORAL | @ 15:00:00

## 2022-11-06 MED ADMIN — pantoprazole (Protonix) EC tablet 40 mg: 40 mg | ORAL | @ 15:00:00

## 2022-11-06 MED ADMIN — insulin lispro (HumaLOG) injection 0-20 Units: 0-20 [IU] | SUBCUTANEOUS | @ 14:00:00

## 2022-11-06 NOTE — Unmapped (Addendum)
Called patient's husband Mr. Redding to discuss the medical indication for lumbar puncture, introducing myself as a member of the procedural team.  Mr. Sampson had multiple concerns including feeling that she has not had enough time on IV antibiotics to improve in her current mental status.  He furthermore had concerns about not knowing exactly who is taking care of her, mentioning that he has seen many different people either in the room or talked to multiple people over the phone.  He feels that she is has presented with a similar mental status/confusion that gradually resolved without the need for a lumbar puncture and he was specifically concerned that she would experience nerve damage or spinal cord injury from the procedure.  Finally, he expressed the concern that there were multiple people training and not the "people who know what they are doing" taking care of his wife.     I assured him that our commitment as a procedural team is to only perform medically necessary procedures as well as the very low risk of spinal cord injury with the ultrasound-guided technique that we use on the procedural service.    I also reassured him that I would communicate these concerns to his primary medical team.    Vernie Ammons  PGY-2 Internal Medicine    Some portions of this note were dictated using Dragon Software and may contain voice recognition errors. Please excuse any typos/errors.
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
## 2022-11-06 NOTE — Unmapped (Signed)
IMMUNOCOMPROMISED HOST INFECTIOUS DISEASE PROGRESS NOTE    Assessment/Plan:     Julie [name] is a 72 y.o. 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 Had perinephric drain in place postoperatively, removed 08/30/22  - Immunosuppression: Tacrolimus (goal 6-8), MMF 500 BID (decreased to 250mg  BID during hospitalization), Pred 5  - Prophylaxis: valganciclovir x 6 months (mod risk), TMP+SMX x 6 months  Estimated Creatinine Clearance: 31.6 mL/min (A) (based on SCr of 1.25 mg/dL (H)).      Pertinent comorbidities:  ESRD was on PD and HD  DM II (A1c 5.6 on 08/06/22)  CAD  HLD  SBO 10/07/22 s/p ex lap 10/13/22 w/ lysis of adhesions     Pertinent exposures:  Originally [place]   Treated with ivermectin at the ID clinic in 2018     Summary of pertinent prior infections:  #History of Shingles 2017  #04/2018 Dialysis fluid Serratia R: Ampicillin, Unasyn, Cefazolin,S: Ceftriaxone, Gentamicin, Levo, PipTazo, Tobra  #Thrush w/presumed candidal esophagitis 10/10/22 s/p 14 day treatment fluconazole  #Donor urine cx with C. Dubliensis 08/05/22, <10K, negative recipient samples, negative donor blood cx, treated with 2 weeks PO fluconazole  # Recurrent E.Coli Bacteremia/UTI  - 1/29 BCx/UCx E. Coli - s/p Levo x 3 weeks (thru stent removal, 3/15) (S: Cefazolin, Cipro/Levo, Pip-tazo, Cephalexin; R: TMP-SMX, Amp, Amp-sulb)  - 3/14 BCx/UCx ESBL/FQ-R E. 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 Coli  - 4/14 BCx 1/1: (NGTD); serum Crypto Neg  Rx: 4/13 Vanc/Pip-tazo--> 4/14 Vancomycin/Meropenem --->      #Diarrhea  -4/13: C diff (-) GIPP neg     # Perinephric fluid collection 10/05/22, re-accumulated after JP drain removal 08/30/22- low suspicion for infection at this time   - 3/19 IR guided fluid collection drainage: bacterial and AFB cultures negative, Cr 1.5 (not suggestive of urinoma)  - 4/13: Transplanted renal U/S with perinephric fluid - decreased in size from prior   - 4/13 CT Ab/P with IV contrast - increased inflammatory stranding surrounding the LLQ transplanted kidney with crescentric fluid collection. Mildly prominent gas and fluid filled loops of small bowel.   - 4/14 Evaluation by transplant surgery - no surgical indication and they signed off.    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 Coli susceptibilities   F/u 4/14  serum CMV PCR, BCx (NGTD)  Recommend repeat BCx; serum Crypto Ag  Recommend LP with Bacterial culture, fungal culture, crypto Ag, VZV, HSV, opening pressures, cell count    Management  Increase IV Meropenem to renally dosed equivalent of 2g q8h   Continue IV Vancomycin, per pharmacy dosing (Goal trough: 10-15)   START IV Acyclovir 10mg /kg daily  Please have Urogyn see patient while hospitalized to help provide interventions to reduce the risk of recurrence   Would re-explore coverage for fosfomycin (and possibly [place] coverage) in light of recurrent hospitalizations. Could also consider methenamine + vitamin C for UTI ppx if fosfomycin cost-prohibitive  Consider alternative to Cellcept (Myfortic?) given diarrhea. In the meantime, recommend imodium given basic infectious wu of diarrhea is negative (I worry diarrhea is contributing to her recurrent UTI)    Antimicrobial prophylaxis required for transplant immunosuppression   Valganciclovir renally dosed equiv of 900 mg daily x 6 months (moderate risk)  TMP+SMX renally dosed equiv of 400/80 po MWF x 6 months  Needs LFT/hepB monitoring as below    Intensive toxicity monitoring for prescription antimicrobials   CBC w/diff at least once per week  CMP at least once per week  clinical assessments for rashes or other skin changes  Vancomycin trough (goal 10-15)  Given hepB core+, needs LFTs q27m for first year, HBSAg and HBV DNA q58m for first year and with any rise in LFTs    The [facility] ID APP service will continue to follow.           Please page Darolyn Rua, NP at [phone] from 8-4:30pm, after 4:30 pm & on weekends, please page the ID Transplant/Liquid Oncology Fellow consult at [phone] with questions.    Patient discussed with Dr. Michiel Cowboy.      Darolyn Rua, MSN, APRN, AGNP-C  Immunocompromised Infectious Disease Nurse Practitioner    I personally spent 40 minutes face-to-face and non-face-to-face in the care of this patient, which includes all pre, intra, and post visit time on the date of service.  All documented time was specific to the E/M visit and does not include any procedures that may have been performed.      Subjective:     External record(s): Primary team note: reviewed and noted plan to hold lantus and home hypertensive medications at this time .    Independent historian(s): was required due to patient status (AMS)..       Interval History:   Afebrile and NAEON. Patient with AMS today and unable to answer questions.     Medications:  Current Medications as of 11/06/2022  Scheduled  PRN acyclovir, 10 mg/kg (Ideal), Q24H  amlodipine, 5 mg, Q12H SCH  aspirin, 81 mg, Daily  atorvastatin, 10 mg, Daily  carvedilol, 12.5 mg, BID  estradiol, 2 g, Nightly  [Provider Hold] heparin (porcine) for subcutaneous use, 5,000 Units, Q8H [facility]  [Provider Hold] insulin glargine, 6 Units, Nightly  insulin lispro, 0-20 Units, ACHS  levothyroxine, 112 mcg, daily  meropenem, 1 g, Q12H  mycophenolate, 250 mg, BID  pantoprazole, 40 mg, Daily  potassium chloride in water, 10 mEq, Once   Followed by  potassium chloride in water, 10 mEq, Once   Followed by  potassium chloride in water, 10 mEq, Once   Followed by  potassium chloride in water, 10 mEq, Once  predniSONE, 5 mg, Daily  sodium chloride 0.9%, 250 mL, Q24H  sodium chloride 0.9%, 250 mL, Q24H  sulfamethoxazole-trimethoprim, 1 tablet, Q MWF  tacrolimus, 1 mg, Daily  And  tacrolimus, 0.5 mg, Nightly  valGANciclovir, 450 mg, Every other day  vancomycin, 750 mg, Q24H      acetaminophen, 650 mg, Q6H PRN  aluminum-magnesium hydroxide-simethicone, 30 mL, Q4H PRN  dextrose in water, 12.5 g, Q10 Min PRN  glucagon, 1 mg, Once PRN  glucose, 16 g, Q10 Min PRN  guaiFENesin, 200 mg, Q4H PRN  melatonin, 3 mg, Nightly PRN  ondansetron, 4 mg, Q8H PRN   Or  ondansetron, 4 mg, Q8H PRN  polyethylene glycol, 17 g, Daily PRN         Objective:     Vital Signs last 24 hours:  Temp:  [36.4 ??C (97.5 ??F)-38.1 ??C (100.6 ??F)] 37.6 ??C (99.7 ??F)  Heart Rate:  [76-88] 76  Resp:  [18-20] 18  BP: (138-196)/(32-70) 138/32  MAP (mmHg):  [62-88] 62  SpO2:  [93 %-100 %] 96 %    Physical Exam:   Patient Lines/Drains/Airways Status       Active Active Lines, Drains, & Airways       Name Placement date Placement time Site Days    Peripheral IV 11/04/22 Anterior;Right;Upper Arm 11/04/22  2159  Arm  1    Arteriovenous Fistula - Vein Graft  Access 08/24/16 Left;Upper Arm 08/24/16  --  Arm  2265                  Const [x]  vital signs above    []  NAD, non-toxic appearance []  Chronically ill-appearing, non-distressed  Sitting up in bed; husband at bedside      Eyes [x]  Lids normal bilaterally, conjunctiva anicteric and noninjected OU     [] PERRL  [] EOMI        ENMT [x]  Normal appearance of external nose and ears, no nasal discharge        [x]  MMM, no lesions on lips or gums []  No thrush, leukoplakia, oral lesions  []  Dentition good []  Edentulous []  Dental caries present  []  Hearing normal  []  TMs with good light reflexes bilaterally         Neck [x]  Neck of normal appearance and trachea midline        []  No thyromegaly, nodules, or tenderness   []  Full neck ROM        Lymph []  No LAD in neck     []  No LAD in supraclavicular area     []  No LAD in axillae   []  No LAD in epitrochlear chains     []  No LAD in inguinal areas        CV [x]  RRR            []  No peripheral edema     []  Pedal pulses intact   []  No abnormal heart sounds appreciated   [x]  Extremities WWP         Resp [x]  Normal WOB at rest    []  No breathlessness with speaking, no coughing  [x]  CTA anteriorly    []  CTA posteriorly          GI [x]  Normal inspection, NTND   []  NABS     []  No umbilical hernia on exam       []  No hepatosplenomegaly     []  Inspection of perineal and perianal areas normal        GU []  Normal external genitalia     [] No urinary catheter present in urethra   []  No CVA tenderness    []  No tenderness over renal allograft  MSK []  No clubbing or cyanosis of hands       []  No vertebral point tenderness  []  No focal tenderness or abnormalities on palpation of joints in RUE, LUE, RLE, or LLE        Skin [x]  No rashes, lesions, or ulcers of visualized skin     []  Skin warm and dry to palpation         Neuro [x]  Face expression symmetric  []  Sensation to light touch grossly intact throughout    []  Moves extremities equally    []  No tremor noted        []  CNs II-XII grossly intact     []  DTRs normal and symmetric throughout []  Gait unremarkable        Psych []  Appropriate affect       []  Fluent speech         []  Attentive, good eye contact  []  Oriented to person, place, time          []  Judgment and insight are appropriate   Altered mental status; unable to answer questions        Data for Medical Decision Making     11/04/22 EKG QTcF    I discussed mgm't w/qualified health care professional(s) involved in case: discussed with primary team recommendations for further diagnostic testing regarding AMS .    I reviewed CBC results (WBC improved), chemistry results (hypokalemia today with improved creatinine), and micro result(s) (blood culture no growth at 24 hr).    I independently visualized/interpreted not done.       Recent Labs   Lab Units 11/06/22  1119 11/06/22  1118 11/05/22  0836 11/04/22  2153 11/04/22  2153   WBC 10*9/L  --  6.9 12.6*  --  10.1   HEMOGLOBIN g/dL  --  7.5* 9.8*  --  9.4*   PLATELET COUNT (1) 10*9/L  --   --  73*  --  86*   NEUTRO ABS 10*9/L  --   --   --   --  9.4*   LYMPHO ABS 10*9/L  --   --   --   --  0.4*   EOSINO ABS 10*9/L  --   --   --   --  0.0   BUN mg/dL 33*  --  41*  --  45*   CREATININE mg/dL 1.61*  --  0.96*  --  0.45*   AST U/L  --   --   --   --  34 - 31   ALT U/L  --   --   --   --  31 - 32   BILIRUBIN TOTAL mg/dL  --   --   --   --  0.3 - 0.3   ALK PHOS U/L  --   --   --   --  89 - 90   POTASSIUM mmol/L 2.8*  --  4.0  --  4.4   MAGNESIUM mg/dL  --   --   --   --  1.3*   PHOSPHORUS mg/dL 2.7  --  4.1   < >  --    CALCIUM mg/dL 7.0*  --  9.0  --  8.8    < > = values in this interval not displayed.     Lab Results   Component Value Date    TACROLIMUS 1.1 (L) 11/06/2022    VANCORANDOM 6.3 11/06/2022         Microbiology:  Microbiology Results (last day)       Procedure Component Value Date/Time Date/Time    Varicella (VZV) PCR, Other [[id number]]     Lab Status: No result Specimen: Cerebrospinal Fluid from Lumbar Puncture     HSV PCR [[id number]]     Lab Status: No result Specimen: Cerebrospinal Fluid from Lumbar Puncture     Narrative:      The following orders were created for panel order HSV PCR.  Procedure                               Abnormality         Status                     ---------                               -----------         ------                     HSV 1 AND 2 BY PCR, NOT.Marland KitchenMarland Kitchen[[id number]]                                                   Please view results for these tests on the individual orders.    HSV 1 AND 2 BY PCR, NOT BLOOD [[id number]]     Lab Status: No result Specimen: Cerebrospinal Fluid from Lumbar Puncture     Fungal ([place]) Pathogen Culture [[id number]]     Lab Status: No result Specimen: Cerebrospinal Fluid from Lumbar Puncture     Protein, Body Fluid [[id number]]     Lab Status: No result Specimen: Cerebrospinal Fluid     Body fluid cell count [[phone]]     Lab Status: No result Specimen: Cerebrospinal Fluid     CSF Culture [[id number]]     Lab Status: No result Specimen: Cerebrospinal Fluid from Lumbar Puncture     Cryptococcal Antigen, CSF [[id number]]     Lab Status: No result Specimen: Cerebrospinal Fluid from Lumbar Puncture     Blood Culture, Adult [[id number]]     Lab Status: No result Specimen: Blood from 1 Peripheral Draw     Blood Culture, Adult [[id number]]     Lab Status: No result Specimen: Blood from 1 Peripheral Draw     GI Pathogen Panel [[id number]]  (Normal) Collected: 11/05/22 0817    Lab Status: Final result Specimen: Stool  Updated: 11/06/22 1206     Giardia Negative     Cryptosporidium Negative     E. coli: O157 Negative     E. coli: Enterotoxigenic (ETEC) Negative     E. coli: Shiga-toxin (STEC) Negative     Salmonella Negative     Shigella Negative     Campylobacter Negative     Comment: Detects C. jejuni, C. coli and C. lari.        Norovirus Negative     Rotavirus Negative    Narrative:      Test performed using Luminex xTAG Gastrointestinal Pathogen Panel, an FDA-cleared qualitative molecular multiplex test. Positive results for Campylobacter, E. coli O157, shiga toxin producing E. coli (STEC), Salmonella, Shigella and Cryptosporidium must be reported by both the Physician  and Laboratory to the [facility], Division of Epidemiology.    Blood Culture [[id number]]  (Normal) Collected: 11/05/22 0838    Lab Status: Preliminary result Specimen: Blood from 1 Peripheral Draw Updated: 11/06/22 0900     Blood Culture, Routine No Growth at 24 hours    Blood Culture [[id number]]  (Abnormal) Collected: 11/04/22 2151    Lab Status: Preliminary result Specimen: Blood from 1 Peripheral Draw Updated: 11/06/22 0837     Blood Culture, Routine Gram Negative Rod, Lactose Fermenter     Gram Stain Result Gram negative rods (bacilli)    Urine Culture [[id number]]  (Abnormal) Collected: 11/05/22 0159    Lab Status: Preliminary result Specimen: Urine from Catheterized-In and Out Catheter Updated: 11/06/22 0737     Urine Culture, Comprehensive 10,000 to 50,000 CFU/mL Escherichia coli     Comment: Susceptibility Testing By Consultation Only  Processed at Physicians Request       Narrative:      Specimen Source: Catheterized-In and Out Catheter    MADD [[id number]] Collected: 11/05/22 0159    Lab Status: Preliminary result Specimen: Urine from Catheterized-In and Out Catheter Updated: 11/06/22 0737     MADD Reqeust Additional request in process           Imaging:  No new imaging

## 2022-11-06 NOTE — Unmapped (Addendum)
Medicine Procedure Service - New Procedure Request Triage    Procedure Requested: LP  Indication: c/f meningitis  Urgency: Within next 24 hours    Questions for all procedures    1. Is the patient able to give consent for this procedure? - No  2. If no, has family to give consent been identified? - Yes, patient's husband or daughter  42. Safe to give peri-procedural anxiolysis? (i.e, lorazepam 1 mg IV; if so, team or M can order) - No  4. Has team placed med procedure consult order? (If not, please remind team to do so) - Yes  5. Is patient currently on anticoagulation? No; received ASA 81 today 4/15 and lovenox 30mg  on 4/14 AM then was held. No AC.    INR   Date Value Ref Range Status   08/06/2022 0.92  Final     PT   Date Value Ref Range Status   08/06/2022 10.3 9.9 - 12.6 sec Final     APTT   Date Value Ref Range Status   10/12/2022 38.1 24.8 - 38.4 sec Final     Platelet   Date Value Ref Range Status   11/05/2022 73 (L) 150 - 450 10*9/L Final   11/03/2022 144 (L) 150 - 450 x10E3/uL Final       Lumbar Puncture    Does the patient need an opening pressure? - Yes  Is patient able to cooperate with positioning? (if not, may not be candidate for bedside LP) - Yes  Has team ordered studies? - Yes  Any special studies needed? (Fungal culture, AFB, and cytology all require large-volume samples) - Yes    Bleeding risk assessment (this is moderate to high risk procedure for bleeding, see attached guideline)  Yes to any below requires discussion with attending and may require delay in procedure:   If coags not sent this admission, is there any reason to think this patient may have a coagulopathy (if so, send coags)? No  INR >1.5? NA  Platelets <30K - No  Prophylactic LMWH in past 12 hours? - No  Therapeutic LMWH in past 24 hours? - No  On a DOAC? Apixaban or rivaroxabin in past 48 hours, or dabigatran in past 72 hours? - No  On high dose ASA or other anti-platelet in past 5-7 days? - No  Has had CNS imaging or needs CNS imaging? (Will need if > 1 years old, immunocompromised, has had a seizure within one week of presentation, has an abnormal level of consciousness, abnormal neurologic exam, or papilledema, if no imaging in past 30 days, or if finding are new since last imaging.) - Yes  Any evidence of elevated ICP on imaging? (will at least need discussion with neurosurgery, neurology and/or neuroradiology before proceeding if yes) - No      Medicine Procedure Service  Guidelines for Peri-procedural Management of Bleeding Risk (revised 08/16/2022)   Low Bleeding Risk  Paracentesis  Thoracentesis  I&D  CVC  Arthrocentesis   Moderate-High Bleeding Risk  Lumbar puncture*   INR <2-3, N/a for cirrhosis <2   Platelet >20 >30        ASA, low dose Not required to hold Do not hold   ASA, high dose Not required to hold Stop 5 days before   Clopidogrel Not required to hold Stop 5 days before   Ticagrelor Not required to hold Stop 5 days before   Prasugrel Not required to hold Stop 7 days before  UFH Not required to hold Stop 2-4 hours before   LMWH (prophylactic) Not required to hold Stop 12 hours before   LMWH (treatment) Not required to hold Stop 24 hours before        Warfarin INR < 3 Stop until INR < 1.8, consider 4 factor prothrombin complex concentrate with heme consult   Apixaban (Eliquis), BID Not required to hold Hold 4 doses (GFR > 50) or 6 doses (GFR <30-50). If urgent, see table below and consider heme consult.   Dabigatran (Pradaxa), BID Not require to hold Hold 5-6 doses (GFR > 50) or 7-8 doses (GFR < 30-50).  If urgent, see table below and consider heme consult.   Rivaroxaban (Xarelto), maintenance once daily Not required to hold Hold for 2 doses (GFR > 30) or 3 doses (GFR < 30).  If urgent, see table below and consider heme consult.     2019 SIR guidelines now consider LP to be a low-risk procedure, was a moderate risk procedure in the 2012 guidelines. [facility] 2015 considers LP high risk, as does MD Dareen Piano 2017 guidelines; both available online  While thoracentesis is considered a low-risk procedure, discuss with IP if patient is on antiplatelets (other than ASA) or DOAC  Adapted from the 2019 Society of Interventional Radiology Consensus Guidelines: https://www.jvir.org/article/S1051-0443(19)30407-5/pdf  While thoracentesis and paracentesis may not require holding anticoagulation, if safe and convenient to hold, doing so may reduce risk for hemorrhage if a vessel is inadvertently punctured

## 2022-11-06 NOTE — Unmapped (Addendum)
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 Readmitted 11/05/22 for GNR UTI+bacteremia, with worse AMS with the current admission.    # Immunosuppression  cPRA 0%, no DSAs, overall at somewhat moderate rejection risk - time early after transplant and multiple UTIs are her highest rejection risks  - resume Cellcept at 250 mg BID (home dose 500 mg BID)  - tacrolimus goal 6-8  Future consideration of switching from CNI to belatacept (for less HTN risk and reduce neurocognitive side effect burden)    # BP management  BP meds held on admission due to infection, now BPs are increasing  - resume amlodipine 10 mg daily (or 5 mg BID)  - resume carvedilol 12.5 mg BID (home dose 25 mg BID)  - currently held: hydralazine 50 mg BID  - previously on but not since last admission: spironolactone 25 mg daily, clonidine patch 0.3 mg/24hrs    # Kidney graft status  Likely outpatient baseline is 1.1-1.4, she is within that. Her nadir post-transplant was 0.75 during the prior admission but that was in the setting of intensive inpatient management and hydration.   - she will need mIVF during times when she can't maintain PO intake    # Frequent UTI  - she will need Urogynecology evaluation, discussed the rationale with patient's husband and daughter. They had previously declined to schedule due to location in [place] but are amenable after understanding the rationale.  An alternative is evaluation by general gynecology to evaluate for pelvic floor prolapse, if urogynecology is out of reach.  - add cranberry supplement and vaginal estrogen  - ICID to determine potential role for prophylactic antibiotic    # Neurocognitive deficits  - advised husband and daughter of potential beneift for swtiching from CNI to belatacept  - re-entered the referral to Neurology for neurocognitive evaluation with Dr. Hope Budds. Completed the Functional Activities Questionairre with daughter, with score of 17. Prior MOCA score 19/26 (see psychiatry note from 10/19/22)

## 2022-11-06 NOTE — Unmapped (Signed)
Tacrolimus Therapeutic Monitoring Pharmacy Note    Julie [name] is a 72 y.o. female continuing tacrolimus.     Indication: Kidney transplant     Date of Transplant:  08/06/22       Prior Dosing Information: Current regimen: tacrolimus 1 mg PO QAM + 0.5 mg PO QPM       Goals:  Therapeutic Drug Levels  Tacrolimus trough goal: 6-8 ng/mL (goal reduced during last hospitalization)    Additional Clinical Monitoring/Outcomes  Monitor renal function (SCr and urine output) and liver function (LFTs)  Monitor for signs/symptoms of adverse events (e.g., hyperglycemia, hyperkalemia, hypomagnesemia, hypertension, headache, tremor)    Results:   Tacrolimus level:  1.1 ng/mL, drawn 2 hours late    Pharmacokinetic Considerations and Significant Drug Interactions:  Concurrent hepatotoxic medications:  Bactrim  Concurrent CYP3A4 substrates/inhibitors: None identified  Concurrent nephrotoxic medications:  Bactrim, IV Acyclovir    Assessment/Plan:  Recommendedation(s)  Tacrolimus level is subtherapeutic today (note level was drawn 2 hours late). Noted patients missed dose of tacrolimus on 4/13 PM.  Increase regimen to 1 mg PO BID    Follow-up  Daily levels at 0600 have been ordered .   A pharmacist will continue to monitor and recommend levels as appropriate    Please page service pharmacist with questions/clarifications.    Christene Lye, PharmD

## 2022-11-06 NOTE — Unmapped (Signed)
Critical Response Nurse Consult Note    Critical Response Nurse consult was ordered for Secondary Assessment    On Unit  3 [place]    The patients primary language is [place].    Interpreter services were not utilized     The consult was Not triggered by DI      The patients DI score at time of consult was 51-60    Interventions included:  Bedside Huddle    The time frame for follow-up reassessment No follow-up is needed at this time    Primary nurse was educated to submit page at the above time frame established, but if patient exhibits any acute deviations from baseline and or have signs and symptoms of patient deterioration, the recommendation is to activate the rapid response team.    Thank you for your consult,    Nadara Mustard, RN BSN

## 2022-11-06 NOTE — Unmapped (Signed)
Vancomycin Therapeutic Monitoring Pharmacy Note    Julie [name] is a 72 y.o. female continuing vancomycin. Date of therapy initiation: 11/04/22.    Indication: Urinary Tract Infection (UTI) and CNS Infection    Prior Dosing Information:  Patient has been pulse dosed in response to levels given concern for poor renal function and poor vancomycin clearance. Last dose was 1000 mg on 4/13 @ 2231      Goals:  Therapeutic Drug Levels  Vancomycin trough goal: 15-20 mg/L for CNS infection    Additional Clinical Monitoring/Outcomes  Renal function, volume status (intake and output)    Results: Vancomycin level 6.3 mg/L, drawn ~36 hours after previous dose administration    Wt Readings from Last 1 Encounters:   10/25/22 56.7 kg (124 lb 14.3 oz)     Creatinine   Date Value Ref Range Status   11/06/2022 1.25 (H) 0.55 - 1.02 mg/dL Final   09/81/1914 7.82 (H) 0.55 - 1.02 mg/dL Final   95/62/1308 6.57 (H) 0.55 - 1.02 mg/dL Final        Pharmacokinetic Considerations and Significant Drug Interactions:  Adult (estimated initial): Vd = 40.3 L, ke = 0.031 hr-1  Concurrent nephrotoxic meds:  Acyclovir    Assessment/Plan:  Recommendation(s)  Start vancomycin 750 mg IV Q24 hours  Estimated trough on recommended regimen:  17 mg/L    Follow-up  Level due:  prior to the third dose on a once daily regimen or sooner if there is a  significant change in renal function  A pharmacist will continue to monitor and order levels as appropriate    Please page service pharmacist with questions/clarifications.    Christene Lye, PharmD

## 2022-11-06 NOTE — Unmapped (Signed)
Pt alert but drowsy, follows commands, responds to spouse, wears hearing aids. Able to take meds whole. BG monitored. Pt remains incontinent, enteric precautions maintained. IV abx infused a ordered. Spouse at bedside. Free from falls and injury, bed alarm on and audible. Bed low and locked, call bell within reach.    Problem: Adult Inpatient Plan of Care  Goal: Plan of Care Review  Outcome: Progressing  Goal: Patient-Specific Goal (Individualized)  Outcome: Progressing  Goal: Absence of Hospital-Acquired Illness or Injury  Outcome: Progressing  Intervention: Identify and Manage Fall Risk  Recent Flowsheet Documentation  Taken 11/05/2022 2000 by Chauncy Passy, RN  Safety Interventions:   bed alarm   fall reduction program maintained   family at bedside   low bed  Intervention: Prevent Skin Injury  Recent Flowsheet Documentation  Taken 11/05/2022 2000 by Chauncy Passy, RN  Positioning for Skin: Supine/Back  Device Skin Pressure Protection: absorbent pad utilized/changed  Skin Protection: adhesive use limited  Intervention: Prevent Infection  Recent Flowsheet Documentation  Taken 11/05/2022 2000 by Chauncy Passy, RN  Infection Prevention: single patient room provided  Goal: Optimal Comfort and Wellbeing  Outcome: Progressing  Goal: Readiness for Transition of Care  Outcome: Progressing  Goal: Rounds/Family Conference  Outcome: Progressing     Problem: Fall Injury Risk  Goal: Absence of Fall and Fall-Related Injury  Outcome: Progressing  Intervention: Promote Injury-Free Environment  Recent Flowsheet Documentation  Taken 11/05/2022 2000 by Chauncy Passy, RN  Safety Interventions:   bed alarm   fall reduction program maintained   family at bedside   low bed     Problem: Self-Care Deficit  Goal: Improved Ability to Complete Activities of Daily Living  Outcome: Progressing     Problem: Infection  Goal: Absence of Infection Signs and Symptoms  Outcome: Progressing  Intervention: Prevent or Manage Infection  Recent Flowsheet Documentation  Taken 11/05/2022 2000 by Chauncy Passy, RN  Infection Management: aseptic technique maintained  Isolation Precautions: enteric precautions maintained     Problem: Wound  Goal: Optimal Coping  Outcome: Progressing  Goal: Optimal Functional Ability  Outcome: Progressing  Intervention: Optimize Functional Ability  Recent Flowsheet Documentation  Taken 11/05/2022 2000 by Chauncy Passy, RN  Activity Management: back to bed  Goal: Absence of Infection Signs and Symptoms  Outcome: Progressing  Intervention: Prevent or Manage Infection  Recent Flowsheet Documentation  Taken 11/05/2022 2000 by Chauncy Passy, RN  Infection Management: aseptic technique maintained  Isolation Precautions: enteric precautions maintained  Goal: Improved Oral Intake  Outcome: Progressing  Goal: Optimal Pain Control and Function  Outcome: Progressing  Goal: Skin Health and Integrity  Outcome: Progressing  Intervention: Optimize Skin Protection  Recent Flowsheet Documentation  Taken 11/05/2022 2000 by Chauncy Passy, RN  Activity Management: back to bed  Pressure Reduction Techniques: frequent weight shift encouraged  Head of Bed (HOB) Positioning: HOB elevated  Pressure Reduction Devices: positioning supports utilized  Skin Protection: adhesive use limited  Goal: Optimal Wound Healing  Outcome: Progressing

## 2022-11-06 NOTE — Unmapped (Signed)
VENOUS ACCESS TEAM PROCEDURE    Nurse request was placed for a "PIV by Venous Access Team (VAT)".  Patient was assessed at bedside for placement of a PIV. PPE were donned per protocol.  Access was obtained. Blood return noted.  Dressing intact and device well secured.  Flushed with normal saline.  See LDA for details.  Pt advised to inform RN of any s/s of discomfort at the PIV site.    Workup / Procedure Time:  30 minutes        RN was notified. Mdb team notified of current piv location and vat suggestion of cvad.Vat will be unable to maintain pivs due to limited access and previous piv location.      Thank you,     Laurann Montana, RN Venous Access Team

## 2022-11-06 NOTE — Unmapped (Signed)
IV Abx given. Afebrile today. Pt is drowsy most of the day, only stayed awake better when daughter is here. Pt seems to be confused and not answer questions most of the time.  But the husband feel she is not confused because she respond to husband better. IV Abx. Pt request to sat on the chair for hours after daughter left but she continue to sleep in the chair. The SpO2 is low d/t cold hand and reached 100% with 2L after measured with earlobe. Has diarrhea, c-diff negative and GIPP pending.

## 2022-11-07 DIAGNOSIS — I120 Hypertensive chronic kidney disease with stage 5 chronic kidney disease or end stage renal disease: Secondary | ICD-10-CM | POA: Diagnosis not present

## 2022-11-07 DIAGNOSIS — A419 Sepsis, unspecified organism: Secondary | ICD-10-CM | POA: Diagnosis not present

## 2022-11-07 DIAGNOSIS — E1122 Type 2 diabetes mellitus with diabetic chronic kidney disease: Secondary | ICD-10-CM | POA: Diagnosis not present

## 2022-11-07 DIAGNOSIS — B9620 Unspecified Escherichia coli [E. coli] as the cause of diseases classified elsewhere: Secondary | ICD-10-CM | POA: Diagnosis not present

## 2022-11-07 DIAGNOSIS — N186 End stage renal disease: Secondary | ICD-10-CM | POA: Diagnosis not present

## 2022-11-07 DIAGNOSIS — Z48815 Encounter for surgical aftercare following surgery on the digestive system: Secondary | ICD-10-CM | POA: Diagnosis not present

## 2022-11-07 DIAGNOSIS — N390 Urinary tract infection, site not specified: Secondary | ICD-10-CM | POA: Diagnosis not present

## 2022-11-07 LAB — CBC
HEMATOCRIT: 21.1 % — ABNORMAL LOW (ref 34.0–44.0)
HEMOGLOBIN: 7.2 g/dL — ABNORMAL LOW (ref 11.3–14.9)
MEAN CORPUSCULAR HEMOGLOBIN CONC: 34.1 g/dL (ref 32.0–36.0)
MEAN CORPUSCULAR HEMOGLOBIN: 33 pg — ABNORMAL HIGH (ref 25.9–32.4)
MEAN CORPUSCULAR VOLUME: 96.7 fL — ABNORMAL HIGH (ref 77.6–95.7)
MEAN PLATELET VOLUME: 9.4 fL (ref 6.8–10.7)
PLATELET COUNT: 50 10*9/L — ABNORMAL LOW (ref 150–450)
RED BLOOD CELL COUNT: 2.18 10*12/L — ABNORMAL LOW (ref 3.95–5.13)
RED CELL DISTRIBUTION WIDTH: 15.3 % — ABNORMAL HIGH (ref 12.2–15.2)
WBC ADJUSTED: 4.8 10*9/L (ref 3.6–11.2)

## 2022-11-07 LAB — TACROLIMUS LEVEL, TROUGH: TACROLIMUS, TROUGH: 2.6 ng/mL — ABNORMAL LOW (ref 5.0–15.0)

## 2022-11-07 LAB — BASIC METABOLIC PANEL
ANION GAP: 10 mmol/L (ref 5–14)
BLOOD UREA NITROGEN: 30 mg/dL — ABNORMAL HIGH (ref 9–23)
BUN / CREAT RATIO: 20
CALCIUM: 7.9 mg/dL — ABNORMAL LOW (ref 8.7–10.4)
CHLORIDE: 112 mmol/L — ABNORMAL HIGH (ref 98–107)
CO2: 18 mmol/L — ABNORMAL LOW (ref 20.0–31.0)
CREATININE: 1.52 mg/dL — ABNORMAL HIGH
EGFR CKD-EPI (2021) FEMALE: 36 mL/min/{1.73_m2} — ABNORMAL LOW (ref >=60)
GLUCOSE RANDOM: 142 mg/dL (ref 70–179)
POTASSIUM: 4 mmol/L (ref 3.4–4.8)
SODIUM: 140 mmol/L (ref 135–145)

## 2022-11-07 LAB — VARICELLA ZOSTER PCR, BLOOD: VZV PCR, BLOOD: NEGATIVE

## 2022-11-07 LAB — PHOSPHORUS: PHOSPHORUS: 3.4 mg/dL (ref 2.4–5.1)

## 2022-11-07 LAB — CRYPTOCOCCAL ANTIGEN, SERUM: CRYPTOCOCCAL ANTIGEN: NEGATIVE

## 2022-11-07 MED ADMIN — tacrolimus (PROGRAF) capsule 1 mg: 1 mg | ORAL | @ 12:00:00

## 2022-11-07 MED ADMIN — insulin lispro (HumaLOG) injection 0-20 Units: 0-20 [IU] | SUBCUTANEOUS | @ 03:00:00

## 2022-11-07 MED ADMIN — mycophenolate (CELLCEPT) capsule 250 mg: 250 mg | ORAL | @ 12:00:00

## 2022-11-07 MED ADMIN — insulin lispro (HumaLOG) injection 0-20 Units: 0-20 [IU] | SUBCUTANEOUS | @ 23:00:00

## 2022-11-07 MED ADMIN — vancomycin (VANCOCIN) 750 mg in sodium chloride (NS) 0.9% 250 mL IVPB: 750 mg | INTRAVENOUS | @ 21:00:00 | Stop: 2022-11-18

## 2022-11-07 MED ADMIN — sodium chloride 0.9% (NS) bolus 500 mL: 500 mL | INTRAVENOUS | @ 09:00:00 | Stop: 2022-11-07

## 2022-11-07 MED ADMIN — amlodipine (NORVASC) tablet 5 mg: 5 mg | ORAL | @ 03:00:00

## 2022-11-07 MED ADMIN — potassium chloride 10 mEq in 100 mL IVPB: 10 meq | INTRAVENOUS | @ 02:00:00 | Stop: 2022-11-06

## 2022-11-07 MED ADMIN — amlodipine (NORVASC) tablet 5 mg: 5 mg | ORAL | @ 12:00:00

## 2022-11-07 MED ADMIN — lactated ringers bolus 500 mL: 500 mL | INTRAVENOUS | @ 02:00:00

## 2022-11-07 MED ADMIN — acyclovir (ZOVIRAX) 455 mg in sodium chloride (NS) 0.9 % 100 mL IVPB: 10 mg/kg | INTRAVENOUS | @ 05:00:00 | Stop: 2022-11-07

## 2022-11-07 MED ADMIN — carvedilol (COREG) tablet 12.5 mg: 12.5 mg | ORAL | @ 12:00:00

## 2022-11-07 MED ADMIN — estradiol (ESTRACE) 0.01 % (0.1 mg/gram) vaginal cream 2 g: 2 g | VAGINAL | @ 04:00:00 | Stop: 2022-11-19

## 2022-11-07 MED ADMIN — acetaminophen (OFIRMEV) 10 mg/mL injection 1,000 mg: 1000 mg | INTRAVENOUS | @ 04:00:00 | Stop: 2022-11-07

## 2022-11-07 MED ADMIN — mycophenolate (CELLCEPT) capsule 250 mg: 250 mg | ORAL | @ 03:00:00

## 2022-11-07 MED ADMIN — levothyroxine (SYNTHROID) tablet 112 mcg: 112 ug | ORAL | @ 11:00:00

## 2022-11-07 MED ADMIN — insulin lispro (HumaLOG) injection 0-20 Units: 0-20 [IU] | SUBCUTANEOUS | @ 19:00:00

## 2022-11-07 MED ADMIN — ondansetron (ZOFRAN) injection 4 mg: 4 mg | INTRAVENOUS | @ 02:00:00

## 2022-11-07 MED ADMIN — meropenem (MERREM) 1 g in sodium chloride 0.9 % (NS) 100 mL IVPB-MBP: 1 g | INTRAVENOUS | @ 04:00:00 | Stop: 2022-11-11

## 2022-11-07 MED ADMIN — acetaminophen (OFIRMEV) 10 mg/mL injection 1,000 mg: 1000 mg | INTRAVENOUS | @ 11:00:00 | Stop: 2022-11-07

## 2022-11-07 MED ADMIN — predniSONE (DELTASONE) tablet 5 mg: 5 mg | ORAL | @ 12:00:00

## 2022-11-07 MED ADMIN — potassium chloride 10 mEq in 100 mL IVPB: 10 meq | INTRAVENOUS | @ 01:00:00 | Stop: 2022-11-06

## 2022-11-07 MED ADMIN — carvedilol (COREG) tablet 12.5 mg: 12.5 mg | ORAL | @ 03:00:00

## 2022-11-07 MED ADMIN — acetaminophen (OFIRMEV) 10 mg/mL injection 1,000 mg: 1000 mg | INTRAVENOUS | @ 19:00:00 | Stop: 2022-11-07

## 2022-11-07 MED ADMIN — aspirin chewable tablet 81 mg: 81 mg | ORAL | @ 12:00:00

## 2022-11-07 MED ADMIN — meropenem (MERREM) 1 g in sodium chloride 0.9 % (NS) 100 mL IVPB-MBP: 1 g | INTRAVENOUS | @ 12:00:00 | Stop: 2022-11-11

## 2022-11-07 MED ADMIN — tacrolimus (PROGRAF) capsule 1 mg: 1 mg | ORAL | @ 03:00:00

## 2022-11-07 MED ADMIN — atorvastatin (LIPITOR) tablet 10 mg: 10 mg | ORAL | @ 12:00:00

## 2022-11-07 MED ADMIN — pantoprazole (Protonix) EC tablet 40 mg: 40 mg | ORAL | @ 12:00:00

## 2022-11-07 NOTE — Unmapped (Signed)
Nephrology ([facility]) Progress Note    Assessment & Plan:   Julie [name] is a 72 y.o. 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 She returns for nausea, vomiting, and lethargy found to have UTI and GNR bacteremia.     Principal Problem:    UTI (urinary tract infection)  Active Problems:    Type 2 diabetes mellitus with diabetic nephropathy, with long-term current use of insulin (CMS-HCC)    End-stage renal disease (CMS-HCC)    Kidney transplant recipient    Anemia due to stage 5 chronic kidney disease, not on chronic dialysis (CMS-HCC)    Delirium  Resolved Problems:    * No resolved hospital problems. *    UTI with SIRS criteria - GNR bacteremia - Hx MDR UTIs - Transplanted kidney  Pt found to have UTI on urinalysis and GNR bacteremia on blood cx. She was recently admitted 3/14 - 4/3 for GNR bacteremia and finished course of meropenam (3/15 - 4/3) prior to discharge. Started on vanc/mere. Urine cx positive for E coli, susceptibilities still pending. Patient has fevered several times in the past 24 hours with Tmax 102.9 even after tylenol administration, though leukocytosis has improved to 6.9. Ongoing concern for lack of source control with recurrent fevers.  - Vancomycin (4/14-) and meropenem 1g q12 (4/14-4/24)  - Discontinued acyclovir   - Follow up urine Cx susceptibilities  - PRN tylenol 1g q8  - Referred to urogyn on recent discharge given recurrent UTI, appt has not been scheduled yet  - started on estrogen topical     AMS (improving) - Acute Toxic Metabolic Encephalopathy  Patient's mental status is greatly improved this morning. She was responding appropriately and alert and oriented. Patient has had waxing and waning AMS. Concern for encephalitis or meningitis per ICID consultation yesterday with recommendation for LP, HSV 1/2, VZV, and fungal for blood/CSF. Patients husband resistant to LP, stating that this is her baseline when sick and hesitant to pursue procedures. Patient not having any meningismus, but ICID expressed concern that these cases can present atypically. With improvement, will hold off on LP for now, though still in the realm of possibility in the future.  - HSV 1/2 PCR, VZV PCR, cryptococcal antigen, fungal pathogen culture   - Treat infection as above  - Normal diet      AKI - S/p DDKT 08/06/22   Follows with Dr. Elvera [name]. 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 Renal US 4/14 found decreased size of perinephric collection around transplanted kidney as compared to previous renal US.   - VIR consulted for possible perinephric fluid drainage for potential source control.   - Daily BMP, P  - Home immunosuppression  - Tacrolimus 1mg  AM + 0.5mg  PM, daily tac levels   - Continue Prednisone 5 daily in setting of infection   - Cellcept 250mg  BID in setting of infection  - Prophylaxis: bactrim and valcyte daily  - Cont daily aspirin       Acute on Chronic Thrombocytopenia - Anemia on CKD  Suspect in the setting of immunosuppression for kidney transplant. Was transfused last admission. Hgb currently stable at 9.4. Plts continue to decline, at 50 on 4/16 down from 144 on admission. Concern for DIC or other comsumptive coagulopathies that could be depleting  plts.   - Daily CBC; transfuse if Hb<7   - DIC panel     HTN  Hypertensive on admission but will be cautious with antihypertensives given SIRS picture and potential to decompensate quickly from G- sepsis. Has remained hypertensive from 143/69-174/38 in the last 24 hours, but permissive in the setting of infection, hospitalization, and holding home medications.    - Continue coreg 12.5mg  bid  - Continue amlodipine 5mg  bid  - Hold chlorthalidone 25mg  and daily hydral 50mg  while infected      T2DM, insulin-dependent   Last A1C 5.6. Home regimen was Lantus 6 units nightly.  - Hold Lantus 6u nightly     APS report filed and pending  Per social work and transplant team. Daughters initiating guardianship application. APS report is pending. Case will be continued to be followed with transplant case manager Julie [name]. [facility] remain involved. Pt resides with husband and daughters manage meds.   - Appreciate CM involvement   - Husband Julie [name] is listed as HCDM in chart until investigation finalized, and pt is too lethargic on admission to determine HCDM    Chronic issues      Recent C/f thrush, candida esophagitis  During last admission. Immunosuppressed. IV micafungin 3/19 and transitioned to fluconazole on 3/26-4/3. No signs currently.    HLD- cont home statin    The patient's presentation is complicated by the following clinically significant conditions requiring additional evaluation and treatment: - Hypercoagulable state requiring additional attention to DVT prophylaxis and treatment  - Thrombocytopenia POA requiring further investigation or monitor  - Chronic kidney disease POA requiring further investigation, treatment, or monitoring   - Volume depletion POA requiring further investigation, treatment, or monitoring  - Complex social situation/SDOH requiring consultation and support of Care Management     Issues Impacting Complexity of Management:  -The patient is at high risk of complications from immunosuppression, antibiotic side effects, transplant complications    Checklist:  Diet: Regular  DVT PPx: Heparin 5000units q8h  Code Status: Full Code  Dispo: Admit to floor    Team Contact Information:   Primary Team: Nephrology ([facility])  Primary Resident: Julie Scott, MD  Resident's Pager: [phone] (Nephrology Intern - Tower)  Primary Medical Student: Julie [name], MS3    Chief Concern:   UTI (urinary tract infection)    Subjective:   Fevered overnight and this AM, Tmax 101.3     Subjectively feeling well this morning but did feel feverish around 6am.     Objective:   Physical Exam:  BP 160/51  - Pulse 77  - Temp (!) 38.5 ??C (101.3 ??F) (Axillary)  - Resp 21  - Ht 152.4 cm (5')  - Wt 56.7 kg (124 lb 14.3 oz)  - SpO2 97%  - BMI 24.39 kg/m??     Gen: Tired appearing but A&O x4  HEENT: Atraumatic, normocephalic  CV: RRR  Pulm: CTA bilaterally, symetric chest rise  Abd: Soft, NTND, surgical staples in abdomen are c/d/I without sign of infection, no tenderness over renal graft site  Ext: No edema, warm and well perfused    Julie [name], MS3    I attest that I have reviewed the medical student note and that the components of the history of the present illness, the physical exam, and the assessment and plan documented were performed by me or were performed in my presence by the student where I verified the documentation and performed (or re-performed) the exam and medical decision making.  Clotilde Dieter, MD  [facility] Internal Medicine, PGY-1

## 2022-11-07 NOTE — Unmapped (Signed)
IMMUNOCOMPROMISED HOST INFECTIOUS DISEASE PROGRESS NOTE    Assessment/Plan:     Julie [name] is a 72 y.o. 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 Had perinephric drain in place postoperatively, removed 08/30/22  - Immunosuppression: Tacrolimus (goal 6-8), MMF 500 BID (decreased to 250mg  BID during hospitalization), Pred 5  - Prophylaxis: valganciclovir x 6 months (mod risk), TMP+SMX x 6 months  Estimated Creatinine Clearance: 26 mL/min (A) (based on SCr of 1.52 mg/dL (H)).      Pertinent comorbidities:  ESRD was on PD and HD  DM II (A1c 5.6 on 08/06/22)  CAD  HLD  SBO 10/07/22 s/p ex lap 10/13/22 w/ lysis of adhesions     Pertinent exposures:  Originally from [place]   Treated with ivermectin at the ID clinic in 2018     Summary of pertinent prior infections:  #History of Shingles 2017  #04/2018 Dialysis fluid [place]ia R: Ampicillin, Unasyn, Cefazolin,S: Ceftriaxone, Gentamicin, Levo, PipTazo, Tobra  #Thrush w/presumed candidal esophagitis 10/10/22 s/p 14 day treatment fluconazole  #Donor urine cx with C. Dubliensis 08/05/22, <10K, negative recipient samples, negative donor blood cx, treated with 2 weeks PO fluconazole  # Recurrent E.Coli Bacteremia/UTI  - 1/29 BCx/UCx E. Coli - s/p Levo x 3 weeks (thru stent removal, 3/15) (S: Cefazolin, Cipro/Levo, Pip-tazo, Cephalexin; R: TMP-SMX, Amp, Amp-sulb)  - 3/14 BCx/UCx ESBL/FQ-R E. 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 Coli (S: Cefepime SDD, Erta/Mero, [name], Tobra; I: Ceftaz, R: Amp, Atreonam, Cefazolin, Ceftriaxone, Cipro/Levo); UCx 10-50K E. Coli (S: Cefepime SDD, Erta/Mero, gent, nitrofurantoin, tobra; I: Ceftaz; R: Amp, Aztreonam, Cefazolin, Ceftriaxone, Cephalexin, Cipro/Levo, tetra, TMP-SMX)   - 4/14 BCx 1/1: (NGTD); serum Crypto Neg  Rx: 4/13 Vanc/Pip-tazo--> 4/14 Vancomycin/Meropenem ---> 4/15 Vanc/Mero/IV acyclovir     #Diarrhea  -4/13: C diff (-) GIPP neg     # Perinephric fluid collection 10/05/22, re-accumulated after JP drain removal 08/30/22- low suspicion for infection at this time   - 3/19 IR guided fluid collection drainage: bacterial and AFB cultures negative, Cr 1.5 (not suggestive of urinoma)  - 4/13: Transplanted renal U/S with perinephric fluid - decreased in size from prior   - 4/13 CT Ab/P with IV contrast - increased inflammatory stranding surrounding the LLQ transplanted kidney with crescentric fluid collection. Mildly prominent gas and fluid filled loops of small bowel.   - 4/14 Evaluation by transplant surgery - no surgical indication and they signed off.    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 Would get bacterial, fungal, AFB cultures.     Management  Continue IV Meropenem 1g q12h (this is renally dosed)  Continue IV Vancomycin, per pharmacy dosing (Goal trough: 10-15)   STOP IV Acyclovir 10mg /kg daily  If possible would have Urogyn see patient while hospitalized to help provide interventions to reduce the risk of recurrence   Would re-explore coverage for fosfomycin (and possibly [place] coverage) in light of recurrent hospitalizations. Could also consider methenamine + vitamin C for UTI ppx if fosfomycin cost-prohibitive  Consider alternative to Cellcept (Myfortic?) given diarrhea. In the meantime, recommend imodium given basic infectious wu of diarrhea is negative (I worry diarrhea is contributing to her recurrent UTI)    Antimicrobial prophylaxis required for transplant immunosuppression   Valganciclovir renally dosed equiv of 900 mg daily x 6 months (moderate risk)  TMP+SMX renally dosed equiv of 400/80 po MWF x 6 months  Needs LFT/hepB monitoring as below    Intensive toxicity monitoring for prescription antimicrobials   CBC w/diff at least once per week  CMP at least once per week  clinical assessments for rashes or other skin changes  Vancomycin trough (goal 10-15)  Given hepB core+, needs LFTs q73m for first year, HBSAg and HBV DNA q24m for first year and with any rise in LFTs    The [facility] ID APP service will continue to follow.           Please page Darolyn Rua, NP at [phone] from 8-4:30pm, after 4:30 pm & on weekends, please page the ID Transplant/Liquid Oncology Fellow consult at [phone] with questions.    Patient discussed with Dr. Michiel Cowboy.      Darolyn Rua, MSN, APRN, AGNP-C  Immunocompromised Infectious Disease Nurse Practitioner    I personally spent 25 minutes face-to-face and non-face-to-face in the care of this patient, which includes all pre, intra, and post visit time on the date of service.  All documented time was specific to the E/M visit and does not include any procedures that may have been performed.      Subjective:     External record(s): Primary team note: reviewed and noted concern for DIC due to platelets continuing to decline .    Independent historian(s): no independent historian required.       Interval History:   T-max 39.4 overnight. Patient awake and alert today. She is able to hold a conversation without falling asleep. Patient denies n/v/d, dysuria, or trouble breathing    Medications:  Current Medications as of 11/07/2022  Scheduled  PRN   acetaminophen, 1,000 mg, Q8H SCH  amlodipine, 5 mg, Q12H SCH  aspirin, 81 mg, Daily  atorvastatin, 10 mg, Daily  carvedilol, 12.5 mg, BID  estradiol, 2 g, Nightly  [Provider Hold] heparin (porcine) for subcutaneous use, 5,000 Units, Q8H [facility]  [Provider Hold] insulin glargine, 6 Units, Nightly  insulin lispro, 0-20 Units, ACHS  levothyroxine, 112 mcg, daily  meropenem, 1 g, Q12H  mycophenolate, 250 mg, BID  pantoprazole, 40 mg, Daily  predniSONE, 5 mg, Daily  sulfamethoxazole-trimethoprim, 1 tablet, Q MWF  tacrolimus, 1 mg, Daily   And  tacrolimus, 1 mg, Nightly  valGANciclovir, 450 mg, Every other day  vancomycin, 750 mg, Q24H      acetaminophen, 650 mg, Q6H PRN  aluminum-magnesium hydroxide-simethicone, 30 mL, Q4H PRN  dextrose in water, 12.5 g,  Q10 Min PRN  glucagon, 1 mg, Once PRN  glucose, 16 g, Q10 Min PRN  guaiFENesin, 200 mg, Q4H PRN  melatonin, 3 mg, Nightly PRN  ondansetron, 4 mg, Q8H PRN   Or  ondansetron, 4 mg, Q8H PRN  polyethylene glycol, 17 g, Daily PRN         Objective:     Vital Signs last 24 hours:  Temp:  [36.4 ??C (97.5 ??F)-39.4 ??C (102.9 ??F)] 36.6 ??C (97.9 ??F)  Heart Rate:  [71-104] 71  Resp:  [16-24] 20  BP: (97-182)/(32-93) 139/45  MAP (mmHg):  [62-105] 73  FiO2 (%):  [1 %] 1 %  SpO2:  [84 %-100 %] 98 %    Physical Exam:   Patient Lines/Drains/Airways Status       Active Active Lines, Drains, & Airways       Name Placement date Placement time Site Days    Peripheral IV 11/04/22 Anterior;Right;Upper Arm 11/04/22  2159  Arm  2    Peripheral IV 11/06/22 Anterior;Right Forearm 11/06/22  1603  Forearm  less than 1    Arteriovenous Fistula - Vein Graft  Access 08/24/16 Left;Upper Arm 08/24/16  --  Arm  2266                  Const [x]  vital signs above    []  NAD, non-toxic appearance []  Chronically ill-appearing, non-distressed  Sitting up in bed      Eyes [x]  Lids normal bilaterally, conjunctiva anicteric and noninjected OU     [] PERRL  [] EOMI        ENMT [x]  Normal appearance of external nose and ears, no nasal discharge        [x]  MMM, no lesions on lips or gums []  No thrush, leukoplakia, oral lesions  []  Dentition good []  Edentulous []  Dental caries present  []  Hearing normal  []  TMs with good light reflexes bilaterally         Neck [x]  Neck of normal appearance and trachea midline        []  No thyromegaly, nodules, or tenderness   []  Full neck ROM        Lymph []  No LAD in neck     []  No LAD in supraclavicular area     []  No LAD in axillae   []  No LAD in epitrochlear chains     []  No LAD in inguinal areas        CV []  RRR            []  No peripheral edema     []  Pedal pulses intact   []  No abnormal heart sounds appreciated   [x]  Extremities WWP   Murmur present      Resp [x]  Normal WOB at rest    []  No breathlessness with speaking, no coughing  [x]  CTA anteriorly    []  CTA posteriorly          GI [x]  Normal inspection, NTND   []  NABS     []  No umbilical hernia on exam       []  No hepatosplenomegaly     []  Inspection of perineal and perianal areas normal        GU []  Normal external genitalia     [x] No urinary catheter present in urethra   []  No CVA tenderness    []  No tenderness over renal allograft        MSK []  No clubbing or cyanosis of hands       []   No vertebral point tenderness  []  No focal tenderness or abnormalities on palpation of joints in RUE, LUE, RLE, or LLE        Skin [x]  No rashes, lesions, or ulcers of visualized skin     []  Skin warm and dry to palpation         Neuro [x]  Face expression symmetric  []  Sensation to light touch grossly intact throughout    []  Moves extremities equally    []  No tremor noted        []  CNs II-XII grossly intact     []  DTRs normal and symmetric throughout []  Gait unremarkable        Psych [x]  Appropriate affect       []  Fluent speech [x]  Attentive, good eye contact  []  Oriented to person, place, time          []  Judgment and insight are appropriate           Data for Medical Decision Making     11/04/22 EKG QTcF    I discussed mgm't w/qualified health care professional(s) involved in case: discussed with primary team concern for fluid collection being infected and recommending drainage .    I reviewed CBC results (Hgb lower than prior; platelets improved), chemistry results (creatinine increased from prior), and Phos WNL .    I independently visualized/interpreted not done.       Recent Labs   Lab Units 11/07/22  0809 11/06/22  1119 11/06/22  1118 11/05/22  0836 11/04/22  2153   WBC 10*9/L 4.8  --  6.9   < > 10.1   HEMOGLOBIN g/dL 7.2*  --  7.5*   < > 9.4*   PLATELET COUNT (1) 10*9/L 50*  --  47*   < > 86*   NEUTRO ABS 10*9/L  --   --  6.2  --  9.4*   LYMPHO ABS 10*9/L  --   --  0.4*  --  0.4*   EOSINO ABS 10*9/L  --   --  0.0  --  0.0   BUN mg/dL 30*   < >  --    < > 45*   CREATININE mg/dL 1.61*   < >  --    < > 1.13*   AST U/L  --   --   --   --  34 - 31   ALT U/L  --   --   --   --  31 - 32   BILIRUBIN TOTAL mg/dL  --   --   --   --  0.3 - 0.3   ALK PHOS U/L  --   --   --   --  89 - 90   POTASSIUM mmol/L 4.0   < >  --    < > 4.4   MAGNESIUM mg/dL  --   --   --   --  1.3*   PHOSPHORUS mg/dL 3.4   < >  --    < >  --    CALCIUM mg/dL 7.9*   < >  --    < > 8.8    < > = values in this interval not displayed.     Lab Results   Component Value Date    TACROLIMUS 2.6 (L) 11/07/2022    VANCORANDOM 6.3 11/06/2022         Microbiology:  Microbiology Results (last day)       Procedure Component Value Date/Time Date/Time  Blood Culture [[id number]]  (Normal) Collected: 11/05/22 0838    Lab Status: Preliminary result Specimen: Blood from 1 Peripheral Draw Updated: 11/07/22 0900     Blood Culture, Routine No Growth at 48 hours    Urine Culture [[id number]]  (Abnormal)  (Susceptibility) Collected: 11/05/22 0159    Lab Status: Edited Result - FINAL Specimen: Urine from Catheterized-In and Out Catheter Updated: 11/07/22 0815     Urine Culture, Comprehensive 10,000 to 50,000 CFU/mL Escherichia coli     Comment: Susceptibility Testing By Consultation Only  Processed at Physicians Request       Narrative:      Specimen Source: Catheterized-In and Out Catheter    Susceptibility       Escherichia coli (1)       Antibiotic Interpretation Microscan Method Status    Ampicillin Resistant  MIC SUSCEPTIBILITY RESULT Final    Aztreonam Resistant  MIC SUSCEPTIBILITY RESULT Final    Cefazolin Resistant  MIC SUSCEPTIBILITY RESULT Final    Cephalexin Resistant  MIC SUSCEPTIBILITY RESULT Final     For uncomplicated UTI's only.       Cefepime Susceptible-Dose Dependent  MIC SUSCEPTIBILITY RESULT Final     Based on 2g every 8h with extended infusion in adult patients.       Ceftazidime Intermediate  MIC SUSCEPTIBILITY RESULT Final    Ceftriaxone Resistant  MIC SUSCEPTIBILITY RESULT Final    Ciprofloxacin Resistant  MIC SUSCEPTIBILITY RESULT Final    Ertapenem Susceptible  MIC SUSCEPTIBILITY RESULT Final    Gentamicin Susceptible  MIC SUSCEPTIBILITY RESULT Final    Levofloxacin Resistant  MIC SUSCEPTIBILITY RESULT Final    Meropenem Susceptible  MIC SUSCEPTIBILITY RESULT Final    Nitrofurantoin Susceptible  MIC SUSCEPTIBILITY RESULT Final    Tetracycline Resistant  MIC SUSCEPTIBILITY RESULT Final     Organisms that test susceptible to tetracycline are considered susceptible to doxycycline.However, some organisms that test intermediate or resistant to tetracycline may be susceptible to doxycycline.       Tobramycin Susceptible  MIC SUSCEPTIBILITY RESULT Final    Trimethoprim + Sulfamethoxazole Resistant  MIC SUSCEPTIBILITY RESULT Final                       MADD [[id number]] Collected: 11/05/22 0159    Lab Status: Final result Specimen: Urine from Catheterized-In and Out Catheter Updated: 11/07/22 0815     MADD Reqeust Additional Request Completed     Comment: This is a corrected result. Previous result was Additional request in process on 11/06/2022 at 0737 EDT       Blood Culture [[id number]]  (Abnormal)  (Susceptibility) Collected: 11/04/22 2151    Lab Status: Final result Specimen: Blood from 1 Peripheral Draw Updated: 11/07/22 0725     Blood Culture, Routine Escherichia coli     Gram Stain Result Gram negative rods (bacilli)    Susceptibility       Escherichia coli (1)       Antibiotic Interpretation Microscan Method Status    Ampicillin Resistant  MIC SUSCEPTIBILITY RESULT Final    Aztreonam Resistant  MIC SUSCEPTIBILITY RESULT Final    Cefazolin Resistant  MIC SUSCEPTIBILITY RESULT Final    Cefepime Susceptible-Dose Dependent  MIC SUSCEPTIBILITY RESULT Final     Based on 2g every 8h with extended infusion in adult patients.       Ceftazidime Intermediate  MIC SUSCEPTIBILITY RESULT Final    Ceftriaxone Resistant  MIC SUSCEPTIBILITY RESULT Final    Ciprofloxacin Resistant  MIC SUSCEPTIBILITY  RESULT Final    Ertapenem Susceptible  MIC SUSCEPTIBILITY RESULT Final    Gentamicin Susceptible  MIC SUSCEPTIBILITY RESULT Final    Levofloxacin Resistant  MIC SUSCEPTIBILITY RESULT Final    Meropenem Susceptible  MIC SUSCEPTIBILITY RESULT Final    Tobramycin Susceptible  MIC SUSCEPTIBILITY RESULT Final                       Blood Culture, Adult [[id number]] Collected: 11/06/22 1617    Lab Status: In process Specimen: Blood from 1 Peripheral Draw Updated: 11/06/22 1632    Blood Culture, Adult [[id number]] Collected: 11/06/22 1623    Lab Status: In process Specimen: Blood from 1 Peripheral Draw Updated: 11/06/22 1631    HSV PCR [[id number]]     Lab Status: No result Specimen: Blood from 1 Peripheral Draw     Narrative:      The following orders were created for panel order HSV PCR.  Procedure                               Abnormality         Status                     ---------                               -----------         ------                     HSV 1 AND 2 BY PCR, BLOOD[[id number]] Please view results for these tests on the individual orders.    HSV 1 AND 2 BY PCR, BLOOD [[id number]]     Lab Status: No result Specimen: Blood                  Imaging:  No new imaging

## 2022-11-07 NOTE — Unmapped (Signed)
VASCULAR INTERVENTIONAL RADIOLOGY (NEW) INPATIENT CONSULTATION     Requesting Attending Physician: Neysa Hotter, MD  Service Requesting Consult: Nephrology (MDB)    Date of Service: 11/07/2022  Consulting Interventional Radiologist: Ciro Backer MD     HPI:     Reason for consult: Intervention in the setting of a peri-transplant collection.     History of Present Illness:   Julie [name] is a 72 y.o. female seen in consultation at the request of Neysa Hotter, MD for evaluation for Intervention in the setting of a peri-transplant collection.     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 She returns for nausea, vomiting, and lethargy found to have UTI and GNR bacteremia. The patient has improved, but is still having fevers and as such the team is requesting to see if aspiration is feasible of a peri-transplant collection.     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)     since July 2017    Hypertension     Hypothyroidism (acquired)     Kidney transplant status, cadaveric 08/08/2022    Type 2 diabetes mellitus (CMS-HCC)        Surgical History:  Past Surgical History:   Procedure Laterality Date    CATARACT EXTRACTION      HYSTERECTOMY      OOPHORECTOMY      PERITONEAL CATHETER INSERTION      PR LAP INSERTION TUNNELED INTRAPERITONEAL CATHETER N/A 09/27/2018    Procedure: LAPAROSCOPY, SURGICAL; WITH INSERTION OF INTRAPERITONEAL CANNULA OR CATHETER, PERMANENT;  Surgeon: Leona Carry, MD;  Location: MAIN OR [place];  Service: Transplant    PR LAP INSERTION TUNNELED INTRAPERITONEAL CATHETER N/A 04/08/2019 Procedure: LAPAROSCOPY, SURGICAL; WITH INSERTION OF INTRAPERITONEAL CANNULA OR CATHETER, PERMANENT;  Surgeon: Leona Carry, MD;  Location: MAIN OR [place];  Service: Transplant    PR LAP REVISE INTRAPERITONEAL CATHETER N/A 08/26/2019    Procedure: LAPAROSCOPY, SURGICAL; W/REVIS PREV PLACED INTRAPERITONEAL CANNULA/CATH, REMOV INTRALUMIN OBSTRUCT MATERIAL;  Surgeon: Leona Carry, MD;  Location: MAIN OR [facility];  Service: Transplant    PR REDUCE VOLVULUS,INTUSS,INTERN HERNIA N/A 10/13/2022    Procedure: REDUCTION OF VOLVULUS, INTUSSUSCEPTION, INTERNAL HERNIA, BY LAPAROTOMY;  Surgeon: Suella Broad, MD;  Location: MAIN OR [place];  Service: Trauma    PR REMOVAL TUNNELED INTRAPERITONEAL CATHETER N/A 05/06/2018    Procedure: REMOVAL OF PERMANENT INTRAPERITONEAL CANNULA OR CATHETER;  Surgeon: Leona Carry, MD;  Location: MAIN OR [facility];  Service: Transplant    PR REMOVE PERITONEAL FOREIGN BODY N/A 04/08/2019    Procedure: Removal Of Peritoneal Of Foreign Body From Peritoneal Cavity;  Surgeon: Leona Carry, MD;  Location: MAIN OR [facility];  Service: Transplant    PR TRANSPLANT,PREP RENAL GRAFT/ARTERIAL Right 08/06/2022    Procedure: [facility] RECONSTRUCTION CADAVER/LIVING DONOR RENAL ALLOGRAFT PRIOR TO TRANSPLANT; ARTERIAL ANASTOMOSIS EAC;  Surgeon: Toledo, Lilyan Punt, MD;  Location: MAIN OR [facility];  Service: Transplant    PR TRANSPLANTATION OF KIDNEY Right 08/06/2022    Procedure: RENAL ALLOTRANSPLANTATION, IMPLANTATION OF GRAFT; WITHOUT RECIPIENT NEPHRECTOMY;  Surgeon: Toledo, Lilyan Punt, MD;  Location: MAIN OR [facility];  Service: Transplant    TUBAL LIGATION         Family History:  The patient's family history includes Alzheimer's disease in her father; Diabetes in her mother, sister, sister, and sister..    Medications:   Current Facility-Administered Medications   Medication Dose Route Frequency Provider Last Rate Last Admin    acetaminophen (TYLENOL) tablet 650 mg  650 mg Oral Q6H PRN Wyline Beady, MD        aluminum-magnesium hydroxide-simethicone (MAALOX MAX) 80-80-8 mg/mL oral suspension  30 mL Oral Q4H PRN Wyline Beady, MD        amlodipine (NORVASC) tablet 5 mg  5 mg Oral Q12H SCH Blohm, Cecille Amsterdam, MD   5 mg at 11/07/22 2956    aspirin chewable tablet 81 mg  81 mg Oral Daily Wyline Beady, MD   81 mg at 11/07/22 2130    atorvastatin (LIPITOR) tablet 10 mg  10 mg Oral Daily Wyline Beady, MD   10 mg at 11/07/22 8657    carvedilol (COREG) tablet 12.5 mg  12.5 mg Oral BID Blohm, Cecille Amsterdam, MD   12.5 mg at 11/07/22 0828    dextrose (D10W) 10% bolus 125 mL  12.5 g Intravenous Q10 Min PRN Wyline Beady, MD        estradiol (ESTRACE) 0.01 % (0.1 mg/gram) vaginal cream 2 g  2 g Vaginal Nightly Blohm, Cecille Amsterdam, MD   2 g at 11/06/22 2331    glucagon injection 1 mg  1 mg Intramuscular Once PRN Wyline Beady, MD        glucose chewable tablet 16 g  16 g Oral Q10 Min PRN Wyline Beady, MD        guaiFENesin (ROBITUSSIN) oral syrup  200 mg Oral Q4H PRN Wyline Beady, MD        [Provider Hold] heparin (porcine) 5,000 unit/mL injection 5,000 Units  5,000 Units Subcutaneous Q8H SCH Boccaccio, Dominic E, MD        [Provider Hold] insulin glargine (LANTUS) injection 6 Units  6 Units Subcutaneous Nightly Tosi, Amanda C, MD        insulin lispro (HumaLOG) injection 0-20 Units  0-20 Units Subcutaneous ACHS Wyline Beady, MD   1 Units at 11/07/22 1437    levothyroxine (SYNTHROID) tablet 112 mcg  112 mcg Oral daily Wyline Beady, MD   112 mcg at 11/07/22 8469    melatonin tablet 3 mg  3 mg Oral Nightly PRN Wyline Beady, MD        meropenem (MERREM) 1 g in sodium chloride 0.9 % (NS) 100 mL IVPB-MBP  1 g Intravenous Q12H Blohm, Cecille Amsterdam, MD   Stopped at 11/07/22 1143    mycophenolate (CELLCEPT) capsule 250 mg  250 mg Oral BID Blohm, Cecille Amsterdam, MD   250 mg at 11/07/22 0828    ondansetron (ZOFRAN-ODT) disintegrating tablet 4 mg  4 mg Oral Q8H PRN Wyline Beady, MD        Or ondansetron [facility]) injection 4 mg  4 mg Intravenous Q8H PRN Wyline Beady, MD   4 mg at 11/06/22 2217    pantoprazole (Protonix) EC tablet 40 mg  40 mg Oral Daily Wyline Beady, MD   40 mg at 11/07/22 0828    polyethylene glycol (MIRALAX) packet 17 g  17 g Oral Daily PRN Wyline Beady, MD        predniSONE (DELTASONE) tablet 5 mg  5  mg Oral Daily Boccaccio, Dominic E, MD   5 mg at 11/07/22 1610    sulfamethoxazole-trimethoprim (BACTRIM) 400-80 mg tablet 80 mg of trimethoprim  1 tablet Oral Q MWF Blohm, Cecille Amsterdam, MD   80 mg of trimethoprim at 11/06/22 1112    tacrolimus (PROGRAF) capsule 1 mg  1 mg Oral Daily Blohm, Cecille Amsterdam, MD   1 mg at 11/07/22 9604    And    tacrolimus (PROGRAF) capsule 1 mg  1 mg Oral Nightly Blohm, Cecille Amsterdam, MD   1 mg at 11/06/22 2247    valGANciclovir (VALCYTE) tablet 450 mg  450 mg Oral Every other day Boccaccio, Dominic E, MD   450 mg at 11/06/22 1111    vancomycin (VANCOCIN) 750 mg in sodium chloride (NS) 0.9% 250 mL IVPB  750 mg Intravenous Q24H Neysa Hotter, MD   Stopped at 11/06/22 1610       Allergies:  Oxycodone, Gabapentin, and Valacyclovir    Social History:  Social History     Tobacco Use    Smoking status: Former     Current packs/day: 0.00     Average packs/day: 0.8 packs/day for 30.0 years (22.5 ttl pk-yrs)     Types: Cigarettes     Start date: 06/06/1976     Quit date: 06/06/2006     Years since quitting: 16.4    Smokeless tobacco: Former   Substance Use Topics    Alcohol use: Yes     Comment: only special occasions    Drug use: No       Objective:      Vital Signs:  Temp:  [36.4 ??C (97.5 ??F)-39.4 ??C (102.9 ??F)] 36.6 ??C (97.9 ??F)  Heart Rate:  [71-104] 71  Resp:  [16-24] 20  BP: (97-182)/(42-93) 139/45  MAP (mmHg):  [69-105] 73  FiO2 (%):  [1 %] 1 %  SpO2:  [84 %-100 %] 98 %      Diagnostic Studies:  I reviewed all pertinent diagnostic studies, including:      Labs:    Recent Labs     11/05/22  0836 11/06/22  1118 11/07/22  0809   WBC 12.6* 6.9 4.8   HGB 9.8* 7.5* 7.2*   HCT 28.8* 22.2* 21.1*   PLT 73* 47* 50*     Recent Labs     11/05/22  0836 11/06/22  1119 11/07/22  0809   NA 138 143 140   K 4.0 2.8* 4.0   CL 109* 113* 112*   BUN 41* 33* 30*   CREATININE 1.56* 1.25* 1.52*   GLU 104 123 142     Recent Labs     11/04/22  2153   PROT 6.0 - 5.9   ALBUMIN 3.4 - 3.3*   AST 34 - 31   ALT 31 - 32   ALKPHOS 89 - 90   BILITOT 0.3 - 0.3   BILIDIR 0.10     No results for input(s): INR, APTT, FIBRINOGEN in the last 72 hours.    Susceptibility data from last 90 days.  Collected Specimen Info Organism Ampicillin Ampicillin + Sulbactam Aztreonam Cefazolin Cefepime Ceftazidime Ceftriaxone Cephalexin Ciprofloxacin Ertapenem Gentamicin Levofloxacin Meropenem   11/05/22 Urine from Catheterized-In and Out Catheter Escherichia coli R  R R SDD I R R R S S R S   11/04/22 Blood from 1 Peripheral Draw Escherichia coli R  R R SDD I R  R S S R S   10/10/22 Throat Candida albicans  Oropharyngeal Flora Isolated                10/05/22 Urine from Clean Catch Escherichia coli                10/05/22 Blood from 1 Peripheral Draw Escherichia coli                10/05/22 Blood from 1 Peripheral Draw Escherichia coli R  R R SDD I R  R S S R S   08/21/22 Urine from Clean Catch Escherichia coli R R  S    S S  S S    08/21/22 Blood from 1 Peripheral Draw Escherichia coli                08/21/22 Blood from 1 Peripheral Draw Escherichia coli R R  S     S  S S      Collected Specimen Info Organism Nitrofurantoin Piperacillin + Tazobactam Tetracycline Tobramycin Trimethoprim + Sulfamethoxazole   11/05/22 Urine from Catheterized-In and Out Catheter Escherichia coli S  R S R   11/04/22 Blood from 1 Peripheral Draw Escherichia coli    S    10/10/22 Throat Candida albicans          Oropharyngeal Flora Isolated        10/05/22 Urine from Clean Catch Escherichia coli        10/05/22 Blood from 1 Peripheral Draw Escherichia coli        10/05/22 Blood from 1 Peripheral Draw Escherichia coli    S 08/21/22 Urine from Clean Catch Escherichia coli S S S S R   08/21/22 Blood from 1 Peripheral Draw Escherichia coli        08/21/22 Blood from 1 Peripheral Draw Escherichia coli  S  S        Imaging:  11/05/2022 CT A/P and rental transplant [place] images personally reviewed.            Assessment and Recommendations:     Julie [name] is a 72 y.o. female seen for intervention in the setting of a peri-transplant collection and recurrent fevers. In review of both the 11/05/22 CT A/P and renal transplant [place], the collection is not amenable to intervention. The fluid collection is not large enough to aspirate. Aspiration would be low yield and likely not therapeutic. Given the morphology of the collection there is also added risk of injuring the transplant during aspiration.     Recommendations:  - No intervention at this time.  - Can consider follow up imaging to evaluate for further growth / resolution if clinically indicated at that time.      The patient was discussed with Dr. Ciro Backer MD    Thank you for involving [place] in the care of this patient. Please page the VIR consult pager with further questions, concerns, or if new issues arise.    Julie [name] du Pisan[place] MD  [place], [facility] VIR

## 2022-11-07 NOTE — Unmapped (Signed)
Med M Procedure Service consulted yesterday for lumbar puncture. Patient's designated HCDM seemed unaware that this procedure was being recommended and declined it.     Once there is clarity about a plan moving forward, if HCDM desires the procedure and will provide consent, and if the patient can cooperate with the procedure so we can safely perform it, please re-consult [place] at [phone].    Physician Signature: Jerel Shepherd, MD (Electronically Signed)  NPI #: [id number]  Date of Signature: 11/07/2022

## 2022-11-07 NOTE — Unmapped (Signed)
Pt is drowsy this shift but able to answer orientation questions to place, time and person. Alertness wax and wanes. I and O's monitored. Family remains at bedside. Pt remains on continuous pulse ox this shift. Rapid consult nurse consulted due to patient having altered mental status and drowsiness. Rapid nurse came to assess pt at bedside. MD asked and came to bedside to assess pt. Pt receiving IV antibiotics and potassium this shift. Family is refusing lumbar puncture. Blood glucose monitored and covered this shift.    Problem: Adult Inpatient Plan of Care  Goal: Plan of Care Review  Outcome: Ongoing - Unchanged  Goal: Patient-Specific Goal (Individualized)  Outcome: Ongoing - Unchanged  Goal: Absence of Hospital-Acquired Illness or Injury  Outcome: Ongoing - Unchanged  Intervention: Identify and Manage Fall Risk  Recent Flowsheet Documentation  Taken 11/06/2022 0743 by Vianne Bulls, RN  Safety Interventions:   bed alarm   fall reduction program maintained   lighting adjusted for tasks/safety   low bed   aspiration precautions   nonskid shoes/slippers when out of bed   isolation precautions  Intervention: Prevent Skin Injury  Recent Flowsheet Documentation  Taken 11/06/2022 0743 by Vianne Bulls, RN  Positioning for Skin: Supine/Back  Device Skin Pressure Protection: absorbent pad utilized/changed  Skin Protection: adhesive use limited  Intervention: Prevent and Manage VTE (Venous Thromboembolism) Risk  Recent Flowsheet Documentation  Taken 11/06/2022 0954 by Vianne Bulls, RN  VTE Prevention/Management:   anticoagulant therapy   ambulation promoted  Goal: Optimal Comfort and Wellbeing  Outcome: Ongoing - Unchanged  Goal: Readiness for Transition of Care  Outcome: Ongoing - Unchanged  Goal: Rounds/Family Conference  Outcome: Ongoing - Unchanged

## 2022-11-07 NOTE — Unmapped (Addendum)
A&O, but can wax and wane, on RA with telemetry and continuous pulse ox. Denies pain. IV abx infused. LR bolus given of 500 ML. IV potassium infused. Pt became febrile, IV tylenol given with good effect. Zofran given for nausea. 500 ML bolus given post acyclovir infusion, see nurse note. Pt was able to get up to bedside commode with assistance for a BM and voiding. Bed alarm on and audible, free from falls. Bed low and locked, call bell and side table within reach.    Pt febrile this AM, MD notified verbally at shift change, on coming Nurse aware, due IV Tylenol given.    Problem: Adult Inpatient Plan of Care  Goal: Plan of Care Review  Outcome: Progressing  Goal: Patient-Specific Goal (Individualized)  Outcome: Progressing  Goal: Absence of Hospital-Acquired Illness or Injury  Outcome: Progressing  Intervention: Identify and Manage Fall Risk  Recent Flowsheet Documentation  Taken 11/06/2022 2000 by Chauncy Passy, RN  Safety Interventions:   bed alarm   fall reduction program maintained   family at bedside   low bed  Intervention: Prevent Skin Injury  Recent Flowsheet Documentation  Taken 11/06/2022 2000 by Chauncy Passy, RN  Positioning for Skin: Supine/Back  Device Skin Pressure Protection: absorbent pad utilized/changed  Skin Protection: adhesive use limited  Intervention: Prevent Infection  Recent Flowsheet Documentation  Taken 11/06/2022 2000 by Chauncy Passy, RN  Infection Prevention: hand hygiene promoted  Goal: Optimal Comfort and Wellbeing  Outcome: Progressing  Goal: Readiness for Transition of Care  Outcome: Progressing  Goal: Rounds/Family Conference  Outcome: Progressing     Problem: Fall Injury Risk  Goal: Absence of Fall and Fall-Related Injury  Outcome: Progressing  Intervention: Promote Injury-Free Environment  Recent Flowsheet Documentation  Taken 11/06/2022 2000 by Chauncy Passy, RN  Safety Interventions:   bed alarm   fall reduction program maintained   family at bedside low bed     Problem: Self-Care Deficit  Goal: Improved Ability to Complete Activities of Daily Living  Outcome: Progressing     Problem: Infection  Goal: Absence of Infection Signs and Symptoms  Outcome: Progressing  Intervention: Prevent or Manage Infection  Recent Flowsheet Documentation  Taken 11/06/2022 2000 by Chauncy Passy, RN  Infection Management: aseptic technique maintained     Problem: Wound  Goal: Optimal Coping  Outcome: Progressing  Goal: Optimal Functional Ability  Outcome: Progressing  Intervention: Optimize Functional Ability  Recent Flowsheet Documentation  Taken 11/06/2022 2000 by Chauncy Passy, RN  Activity Management: back to bed  Goal: Absence of Infection Signs and Symptoms  Outcome: Progressing  Intervention: Prevent or Manage Infection  Recent Flowsheet Documentation  Taken 11/06/2022 2000 by Chauncy Passy, RN  Infection Management: aseptic technique maintained  Goal: Improved Oral Intake  Outcome: Progressing  Goal: Optimal Pain Control and Function  Outcome: Progressing  Goal: Skin Health and Integrity  Outcome: Progressing  Intervention: Optimize Skin Protection  Recent Flowsheet Documentation  Taken 11/06/2022 2000 by Chauncy Passy, RN  Activity Management: back to bed  Pressure Reduction Techniques: frequent weight shift encouraged  Head of Bed (HOB) Positioning: HOB at 20-30 degrees  Pressure Reduction Devices: positioning supports utilized  Skin Protection: adhesive use limited  Goal: Optimal Wound Healing  Outcome: Progressing

## 2022-11-07 NOTE — Unmapped (Signed)
Tacrolimus Therapeutic Monitoring Pharmacy Note    Julie [name] is a 72 y.o. female continuing tacrolimus.     Indication: Kidney transplant     Date of Transplant:  08/06/22       Prior Dosing Information: Current regimen: tacrolimus 1 mg PO QAM + 1 mg PO QPM (dose increased starting 4/15 PM)    Goals:  Therapeutic Drug Levels  Tacrolimus trough goal: 6-8 ng/mL (goal reduced during last hospitalization)    Additional Clinical Monitoring/Outcomes  Monitor renal function (SCr and urine output) and liver function (LFTs)  Monitor for signs/symptoms of adverse events (e.g., hyperglycemia, hyperkalemia, hypomagnesemia, hypertension, headache, tremor)    Results:   Tacrolimus level:  2.6 ng/mL, drawn ~3 hours early    Pharmacokinetic Considerations and Significant Drug Interactions:  Concurrent hepatotoxic medications:  Bactrim  Concurrent CYP3A4 substrates/inhibitors: None identified  Concurrent nephrotoxic medications:  Bactrim, IV Acyclovir (discontinued 4/16 AM)    Assessment/Plan:  Recommendedation(s)  Continue current regimen of tacrolimus 1 mg PO BID. Dose was increased starting 4/15 PM and patients levels do seem to be responding. Would monitor on this increased regimen for today and not make any changes until tomorrow at the earliest.     Follow-up  Daily levels at 0600 have been ordered .   A pharmacist will continue to monitor and recommend levels as appropriate    Please page service pharmacist with questions/clarifications.    Christene Lye, PharmD

## 2022-11-08 LAB — RENAL FUNCTION PANEL
ALBUMIN: 2.4 g/dL — ABNORMAL LOW (ref 3.4–5.0)
ANION GAP: 11 mmol/L (ref 5–14)
BLOOD UREA NITROGEN: 24 mg/dL — ABNORMAL HIGH (ref 9–23)
BUN / CREAT RATIO: 21
CALCIUM: 7.9 mg/dL — ABNORMAL LOW (ref 8.7–10.4)
CHLORIDE: 109 mmol/L — ABNORMAL HIGH (ref 98–107)
CO2: 19 mmol/L — ABNORMAL LOW (ref 20.0–31.0)
CREATININE: 1.15 mg/dL — ABNORMAL HIGH
EGFR CKD-EPI (2021) FEMALE: 50 mL/min/{1.73_m2} — ABNORMAL LOW (ref >=60)
GLUCOSE RANDOM: 190 mg/dL — ABNORMAL HIGH (ref 70–179)
PHOSPHORUS: 2.6 mg/dL (ref 2.4–5.1)
POTASSIUM: 4 mmol/L (ref 3.4–4.8)
SODIUM: 139 mmol/L (ref 135–145)

## 2022-11-08 LAB — PHOSPHORUS: PHOSPHORUS: 2.7 mg/dL (ref 2.4–5.1)

## 2022-11-08 LAB — ADDON DIFFERENTIAL ONLY
BASOPHILS ABSOLUTE COUNT: 0 10*9/L (ref 0.0–0.1)
BASOPHILS RELATIVE PERCENT: 0.4 %
EOSINOPHILS ABSOLUTE COUNT: 0 10*9/L (ref 0.0–0.5)
EOSINOPHILS RELATIVE PERCENT: 1.1 %
LYMPHOCYTES ABSOLUTE COUNT: 0.2 10*9/L — ABNORMAL LOW (ref 1.1–3.6)
LYMPHOCYTES RELATIVE PERCENT: 7.1 %
MONOCYTES ABSOLUTE COUNT: 0.2 10*9/L — ABNORMAL LOW (ref 0.3–0.8)
MONOCYTES RELATIVE PERCENT: 7.7 %
NEUTROPHILS ABSOLUTE COUNT: 2.5 10*9/L (ref 1.8–7.8)
NEUTROPHILS RELATIVE PERCENT: 83.7 %

## 2022-11-08 LAB — CBC
HEMATOCRIT: 21.5 % — ABNORMAL LOW (ref 34.0–44.0)
HEMOGLOBIN: 7.4 g/dL — ABNORMAL LOW (ref 11.3–14.9)
MEAN CORPUSCULAR HEMOGLOBIN CONC: 34.4 g/dL (ref 32.0–36.0)
MEAN CORPUSCULAR HEMOGLOBIN: 32.8 pg — ABNORMAL HIGH (ref 25.9–32.4)
MEAN CORPUSCULAR VOLUME: 95.4 fL (ref 77.6–95.7)
MEAN PLATELET VOLUME: 8.6 fL (ref 6.8–10.7)
PLATELET COUNT: 56 10*9/L — ABNORMAL LOW (ref 150–450)
RED BLOOD CELL COUNT: 2.26 10*12/L — ABNORMAL LOW (ref 3.95–5.13)
RED CELL DISTRIBUTION WIDTH: 15 % (ref 12.2–15.2)
WBC ADJUSTED: 3.1 10*9/L — ABNORMAL LOW (ref 3.6–11.2)

## 2022-11-08 LAB — VANCOMYCIN, RANDOM: VANCOMYCIN RANDOM: 13.8 ug/mL

## 2022-11-08 LAB — PROTIME-INR
INR: 0.89
PROTIME: 10 s (ref 9.9–12.6)

## 2022-11-08 LAB — APTT
APTT: 26 s (ref 24.8–38.4)
HEPARIN CORRELATION: <0.2

## 2022-11-08 LAB — FIBRINOGEN: FIBRINOGEN LEVEL: 570 mg/dL — ABNORMAL HIGH (ref 175–500)

## 2022-11-08 LAB — TACROLIMUS LEVEL, TROUGH: TACROLIMUS, TROUGH: 3.3 ng/mL — ABNORMAL LOW (ref 5.0–15.0)

## 2022-11-08 LAB — D-DIMER, QUANTITATIVE: D-DIMER QUANTITATIVE (CW,ML,HL,HS,CH,JS,JC,RX,RH): 2193 ng{FEU}/mL — ABNORMAL HIGH (ref <=500)

## 2022-11-08 MED ADMIN — insulin lispro (HumaLOG) injection 0-20 Units: 0-20 [IU] | SUBCUTANEOUS | @ 18:00:00

## 2022-11-08 MED ADMIN — meropenem (MERREM) 1 g in sodium chloride 0.9 % (NS) 100 mL IVPB-MBP: 1 g | INTRAVENOUS | @ 02:00:00 | Stop: 2022-11-11

## 2022-11-08 MED ADMIN — acetaminophen (TYLENOL) tablet 650 mg: 650 mg | ORAL | @ 03:00:00

## 2022-11-08 MED ADMIN — carvedilol (COREG) tablet 12.5 mg: 12.5 mg | ORAL | @ 13:00:00 | Stop: 2022-11-08

## 2022-11-08 MED ADMIN — meropenem (MERREM) 1 g in sodium chloride 0.9 % (NS) 100 mL IVPB-MBP: 1 g | INTRAVENOUS | @ 13:00:00 | Stop: 2022-11-11

## 2022-11-08 MED ADMIN — estradiol (ESTRACE) 0.01 % (0.1 mg/gram) vaginal cream 2 g: 2 g | VAGINAL | @ 02:00:00 | Stop: 2022-11-19

## 2022-11-08 MED ADMIN — vancomycin (VANCOCIN) 750 mg in sodium chloride (NS) 0.9% 250 mL IVPB: 750 mg | INTRAVENOUS | @ 20:00:00 | Stop: 2022-11-08

## 2022-11-08 MED ADMIN — atorvastatin (LIPITOR) tablet 10 mg: 10 mg | ORAL | @ 13:00:00

## 2022-11-08 MED ADMIN — valGANciclovir (VALCYTE) tablet 450 mg: 450 mg | ORAL | @ 13:00:00

## 2022-11-08 MED ADMIN — tacrolimus (PROGRAF) capsule 1 mg: 1 mg | ORAL | @ 14:00:00

## 2022-11-08 MED ADMIN — tacrolimus (PROGRAF) capsule 1 mg: 1 mg | ORAL | @ 01:00:00

## 2022-11-08 MED ADMIN — mycophenolate (CELLCEPT) capsule 250 mg: 250 mg | ORAL | @ 13:00:00

## 2022-11-08 MED ADMIN — predniSONE (DELTASONE) tablet 5 mg: 5 mg | ORAL | @ 13:00:00

## 2022-11-08 MED ADMIN — mycophenolate (CELLCEPT) capsule 250 mg: 250 mg | ORAL | @ 01:00:00

## 2022-11-08 MED ADMIN — aspirin chewable tablet 81 mg: 81 mg | ORAL | @ 13:00:00

## 2022-11-08 MED ADMIN — insulin lispro (HumaLOG) injection 0-20 Units: 0-20 [IU] | SUBCUTANEOUS | @ 22:00:00

## 2022-11-08 MED ADMIN — levothyroxine (SYNTHROID) tablet 112 mcg: 112 ug | ORAL | @ 09:00:00

## 2022-11-08 MED ADMIN — amlodipine (NORVASC) tablet 5 mg: 5 mg | ORAL | @ 13:00:00

## 2022-11-08 MED ADMIN — pantoprazole (Protonix) EC tablet 40 mg: 40 mg | ORAL | @ 13:00:00

## 2022-11-08 MED ADMIN — lactated ringers bolus 250 mL: 250 mL | INTRAVENOUS | @ 05:00:00 | Stop: 2022-11-08

## 2022-11-08 MED ADMIN — sulfamethoxazole-trimethoprim (BACTRIM) 400-80 mg tablet 80 mg of trimethoprim: 1 | ORAL | @ 13:00:00 | Stop: 2023-11-02

## 2022-11-08 NOTE — Unmapped (Signed)
Alert and oriented, Verbalizing needs, VSS on RA, denies pain, telemetry and continuous pulse ox on. BG monitored, no coverage given. IV abx infused. Pt became febrile, MD notified, PRN Tylenol given and 250 ML bolus of LR infused. Pt tolerated well. Went for chest xray. Pt educated on use of call bell, up to bedside commode to void. Bed alarm on and audible, free from falls, spouse at bedside. Bedside table and call bell within reach.    Problem: Adult Inpatient Plan of Care  Goal: Plan of Care Review  Outcome: Progressing  Goal: Patient-Specific Goal (Individualized)  Outcome: Progressing  Goal: Absence of Hospital-Acquired Illness or Injury  Outcome: Progressing  Intervention: Identify and Manage Fall Risk  Recent Flowsheet Documentation  Taken 11/07/2022 1945 by Chauncy Passy, RN  Safety Interventions:   bed alarm   fall reduction program maintained   low bed  Intervention: Prevent Skin Injury  Recent Flowsheet Documentation  Taken 11/07/2022 1945 by Chauncy Passy, RN  Positioning for Skin: Supine/Back  Device Skin Pressure Protection: absorbent pad utilized/changed  Skin Protection: adhesive use limited  Intervention: Prevent Infection  Recent Flowsheet Documentation  Taken 11/07/2022 1945 by Chauncy Passy, RN  Infection Prevention: hand hygiene promoted  Goal: Optimal Comfort and Wellbeing  Outcome: Progressing  Goal: Readiness for Transition of Care  Outcome: Progressing  Goal: Rounds/Family Conference  Outcome: Progressing     Problem: Fall Injury Risk  Goal: Absence of Fall and Fall-Related Injury  Outcome: Progressing  Intervention: Promote Injury-Free Environment  Recent Flowsheet Documentation  Taken 11/07/2022 1945 by Chauncy Passy, RN  Safety Interventions:   bed alarm   fall reduction program maintained   low bed     Problem: Self-Care Deficit  Goal: Improved Ability to Complete Activities of Daily Living  Outcome: Progressing     Problem: Infection  Goal: Absence of Infection Signs and Symptoms  Outcome: Progressing  Intervention: Prevent or Manage Infection  Recent Flowsheet Documentation  Taken 11/07/2022 1945 by Chauncy Passy, RN  Infection Management: aseptic technique maintained     Problem: Wound  Goal: Optimal Coping  Outcome: Progressing  Goal: Optimal Functional Ability  Outcome: Progressing  Intervention: Optimize Functional Ability  Recent Flowsheet Documentation  Taken 11/07/2022 1945 by Chauncy Passy, RN  Activity Management: up to bedside commode  Goal: Absence of Infection Signs and Symptoms  Outcome: Progressing  Intervention: Prevent or Manage Infection  Recent Flowsheet Documentation  Taken 11/07/2022 1945 by Chauncy Passy, RN  Infection Management: aseptic technique maintained  Goal: Improved Oral Intake  Outcome: Progressing  Goal: Optimal Pain Control and Function  Outcome: Progressing  Goal: Skin Health and Integrity  Outcome: Progressing  Intervention: Optimize Skin Protection  Recent Flowsheet Documentation  Taken 11/07/2022 1945 by Chauncy Passy, RN  Activity Management: up to bedside commode  Pressure Reduction Techniques: frequent weight shift encouraged  Head of Bed (HOB) Positioning: HOB at 20-30 degrees  Pressure Reduction Devices: positioning supports utilized  Skin Protection: adhesive use limited  Goal: Optimal Wound Healing  Outcome: Progressing

## 2022-11-08 NOTE — Unmapped (Signed)
OCCUPATIONAL THERAPY  Evaluation (11/08/22 0814)    Patient Name:  Julie [name]       Medical Record Number: [id number]   Date of Birth: December 31, 1950  Sex: Female            OT Treatment Diagnosis:  Pt presented with decreased activity tolerance, decreased safety awareness, balance deficits, strength deficits, impaired judgement, and poor insight into her deficits which impacted her ability to perform ADLs and functional mobility safely and independently.    Assessment  Problem List: Decreased mobility, Impaired judgement, Decreased safety awareness, Decreased strength, Impaired balance, Decreased activity tolerance, Decreased endurance, Impaired ADLs, Fall risk, Gait deviation  Personal Factors/Comorbidities (Occupational Profile and History Review): Expanded (Moderate)    Assessment: Unnamed [name] is a 72 y.o. 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 She returns for nausea, vomiting, and lethargy found to have UTI.    Pt was initially resistant to participating with OT d/t feeling very cold and shivering. Pt's room temperature was increased, and she was given 2 warm blankets. With maximal encouragement from the pt's husband and the OT, pt was agreeable to attempting OOB activity. Pt presented with decreased activity tolerance, decreased safety awareness, balance deficits, strength deficits, impaired judgement, and poor insight into her deficits which impacted her ability to perform ADLs and functional mobility safely and independently. Pt would benefit from continued skilled OT services to maximize her independence and minimize the caregiver burden for DC. Pt is currently appropriate for a recommendation of 5xL with potential to progress to 3x. After review of the patient's occupational profile and history, assessment of occupational performance, clinical decision making, and development of POC, the patient presents as a moderate complexity case.    Activity Tolerance During Today's Session  Limited by fatigue, Other (Limited by feeling cold/shivering; RN was made aware)    Plan  Planned Frequency of Treatment:  1-2x per day for: 3-4x week  Planned Treatment Duration: 11/29/22    Planned Interventions:  Education (Patient/Family/Caregiver), Clinical research associate, Home Exercise Program, Manual Therapy, Therapeutic Activity, Therapeutic Exercise, Self-Care/Home Training, Modalities      Post-Discharge Occupational Therapy Recommendations:   5x weekly, Low intensity (Progress to 3x)     OT DME Recommendations: Defer to post acute    GOALS:   Patient and Family Goals: Pt reported that she wanted to be independent for everything.    Short Term:  SHORT GOAL #1: Pt will safely perform a toileting routine including t/f mod I using LRAD as needed.   Time Frame : 3 weeks  SHORT GOAL #2: Pt will safely perform full body dressing mod I using LH AE as needed.   Time Frame : 3 weeks  SHORT GOAL #3: Pt will safely perform a 5+ minute standing ADL routine mod I using LRAD as needed.   Time Frame : 3 weeks  SHORT GOAL #4: Pt will perform full body bathing mod I using LRAD as needed.   Time Frame : 3 weeks    Prognosis:  Good  Positive Indicators:  PLOF, supportive husband  Barriers to Discharge: Decreased safety awareness, Endurance deficits, Functional strength deficits, Impaired Balance, Inability to safely perform ADLS, Poor insight into deficits    Subjective  Prior Functional Status Per pt and pt's husband, pt is typically independent  for functional ambulation without AD, but she has a rollator that she has been using recently as needed. At baseline pt is independent for ADLs. Per pt's husband, the pt usually uses the tub shower with the shower chair for bathing. Pt typically drives, and helps her husband with grocery shopping, cooking, and cleaning.    Medical Tests / Procedures: Reviewed in chart  Services patient receives prior to admission: PT (Per pt's husband, pt recieved home health PT, and the PT stated she was good to DC for HHPT last Friday)    Patient / Caregiver reports: I am so cold. RN was made aware pt was shivering. Per RN, she had recently taken the pt's temperature, and it was normal.    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)     since July 2017    Hypertension     Hypothyroidism (acquired)     Kidney transplant status, cadaveric 08/08/2022    Type 2 diabetes mellitus (CMS-HCC)     Social History     Tobacco Use    Smoking status: Former     Current packs/day: 0.00     Average packs/day: 0.8 packs/day for 30.0 years (22.5 ttl pk-yrs)     Types: Cigarettes     Start date: 06/06/1976     Quit date: 06/06/2006     Years since quitting: 16.4    Smokeless tobacco: Former   Substance Use Topics    Alcohol use: Yes     Comment: only special occasions      Past Surgical History:   Procedure Laterality Date    CATARACT EXTRACTION      HYSTERECTOMY      OOPHORECTOMY      PERITONEAL CATHETER INSERTION      PR LAP INSERTION TUNNELED INTRAPERITONEAL CATHETER N/A 09/27/2018    Procedure: LAPAROSCOPY, SURGICAL; WITH INSERTION OF INTRAPERITONEAL CANNULA OR CATHETER, PERMANENT;  Surgeon: Leona Carry, MD;  Location: MAIN OR [place];  Service: Transplant    PR LAP INSERTION TUNNELED INTRAPERITONEAL CATHETER N/A 04/08/2019    Procedure: LAPAROSCOPY, SURGICAL; WITH INSERTION OF INTRAPERITONEAL CANNULA OR CATHETER, PERMANENT;  Surgeon: Leona Carry, MD;  Location: MAIN OR [place];  Service: Transplant    PR LAP REVISE INTRAPERITONEAL CATHETER N/A 08/26/2019    Procedure: LAPAROSCOPY, SURGICAL; W/REVIS PREV PLACED INTRAPERITONEAL CANNULA/CATH, REMOV INTRALUMIN OBSTRUCT MATERIAL;  Surgeon: Leona Carry, MD;  Location: MAIN OR [facility];  Service: Transplant    PR REDUCE VOLVULUS,INTUSS,INTERN HERNIA N/A 10/13/2022    Procedure: REDUCTION OF VOLVULUS, INTUSSUSCEPTION, INTERNAL HERNIA, BY LAPAROTOMY;  Surgeon: Suella Broad, MD;  Location: MAIN OR [place];  Service: Trauma    PR REMOVAL TUNNELED INTRAPERITONEAL CATHETER N/A 05/06/2018    Procedure: REMOVAL OF PERMANENT INTRAPERITONEAL CANNULA OR CATHETER;  Surgeon: Leona Carry, MD;  Location: MAIN OR [facility];  Service: Transplant    PR REMOVE PERITONEAL FOREIGN BODY N/A 04/08/2019    Procedure: Removal Of Peritoneal Of Foreign Body From Peritoneal Cavity;  Surgeon: Leona Carry, MD;  Location: MAIN OR [facility];  Service: Transplant    PR TRANSPLANT,PREP RENAL GRAFT/ARTERIAL Right 08/06/2022    Procedure: [facility] RECONSTRUCTION CADAVER/LIVING DONOR RENAL ALLOGRAFT PRIOR TO TRANSPLANT; ARTERIAL ANASTOMOSIS EAC;  Surgeon: Toledo, Lilyan Punt, MD;  Location: MAIN OR [facility];  Service: Transplant    PR TRANSPLANTATION OF KIDNEY  Right 08/06/2022    Procedure: RENAL ALLOTRANSPLANTATION, IMPLANTATION OF GRAFT; WITHOUT RECIPIENT NEPHRECTOMY;  Surgeon: Toledo, Lilyan Punt, MD;  Location: MAIN OR [facility];  Service: Transplant    TUBAL LIGATION      Family History   Problem Relation Age of Onset    Diabetes Mother     Alzheimer's disease Father     Diabetes Sister     Diabetes Sister     Diabetes Sister     Anesthesia problems Neg Hx     Bleeding Disorder Neg Hx         Oxycodone, Gabapentin, and Valacyclovir     Objective Findings  Precautions / Restrictions  Falls precautions       Weight Bearing  Non-applicable    Required Braces or Orthoses  Non-applicable    Communication Preference  Verbal, Visual (Per RN, pt spoke English well and did not need an interpreter.)    Pain  Pt denied pain. All activities were adjusted per pt's tolerance.    Equipment / Environment  Vascular access (PIV, TLC, Port-a-cath, PICC), Telemetry    Living Situation  Living Environment: House  Lives With: Spouse  Home Living: One level home, Stairs to enter with rails, Tub/shower unit, Standard height toilet, Shower chair with back, Walk-in shower  Rail placement (outside): Rail on right side  Number of Stairs to Enter (outside): 3  Caregiver Identified?: Yes  Caregiver Ability: Limited lifting  Equipment available at home: Paediatric nurse with back, Rollator     Cognition   Orientation Level:  Oriented x 4   Arousal/Alertness:  Appropriate responses to stimuli   Attention Span:  Difficulty dividing attention   Memory:  Decreased short term memory (Per recent eval, pt's daugther reported that pt struggles most with short term memory, and some long term memory)   Following Commands:  Follows one step commands with increased time, Follows one step commands with repetition   Safety Judgment:  Decreased awareness of need for safety   Awareness of Errors and Problem Solving:  Assistance required to identify errors made, Assistance required to generate solutions, Assistance required to implement solutions    Vision / Hearing   Vision: Wears glasses for reading only   Hearing: Hearing impairment, Hearing aids present   Hearing: Pt donned hearing aides for the second half of this session, and her hearing improved.     Hand Function:  Right Hand Function: Right hand function impaired  Right Hand Impairment: grip strength fair  Left Hand Function: Left hand function impaired  Left Hand Impairment: grip strength fair    Skin Inspection:  Skin Inspection: Intact where visualized    ROM / Strength:  UE ROM/Strength: Right Impaired/Limited, Left Impaired/Limited  RUE Impairment: Reduced strength  LUE Impairment: Reduced strength  LE ROM/Strength: Left Impaired/Limited, Right Impaired/Limited  RLE Impairment: Reduced strength  LLE Impairment: Reduced strength    Sensation:  RUE Sensation: RUE intact  LUE Sensation: LUE intact  RLE Sensation: RLE intact  LLE Sensation: LLE intact    Balance:  Static Sitting-Level of Assistance: Supervision  Dynamic Sitting-Level of Assistance: Contact guard    Static Standing-Level of Assistance: Contact guard  Dynamic Standing - Level of Assistance: Minimum assistance    Functional Mobility  Transfers: Contact Guard assist, Min assist (Sit <> stand from EOB with CGA and HHA. Sit <> stand t/f from the [facility] with min A and HHA.)  Bed Mobility - Needs Assistance: Mod assist, Min assist (Semisupine > sitting EOB t/f  with min A for her trunk. Sitting EOB > semisupine t/f with mod A to manage her LEs BTB.)  Ambulation: Pt ambulated 23ft x 2 with min A and HHA. Pt declined using the rollator at this time. Pt performed 5 lateral side steps towards the [facility] with min A and HHA.     ADLs  ADLs: Needs assistance with ADLs  ADLs - Needs Assistance: Feeding, Grooming, Bathing, Toileting, UB dressing, LB dressing  Feeding - Needs Assistance: Set Up Assist, Supervision  Grooming - Needs Assistance: Set Up Assist, Supervision (To wash face semisupine in bed)  Bathing - Needs Assistance: Mod assist  Toileting - Needs Assistance: Mod assist (Pt required mod A to manage her balance and assist with perineal hygiene in standing following a bladder movement.)  UB Dressing - Needs Assistance: Min assist (To adjust hospital gown seated at the EOB)  LB Dressing - Needs Assistance: Max assist (To adjust socks)  IADLs: NT    Vitals / Orthostatics  Vitals/Orthostatics: Asymptomatic    Patient at end of session: All needs in reach, Alarm activated, In bed, Friends/Family present, Lines intact, Nurse notified     Occupational Therapy Session Duration  OT Individual [mins]: 24       I attest that I have reviewed the above information.  Signed: Eben Burow, OT  Filed 11/08/2022

## 2022-11-08 NOTE — Unmapped (Signed)
A&Ox3, disoriented to situation, requires time and assistance to express needs. VSS (hypertensive, MD aware), on RA. Telemetry and continuous pulse ox monitoring in place. Denies pain. Save L arm precautions in place. BG monitored and insulin coverage provided per orders. Bilateral hearing aids in place. IV abx infused. Requires x1 contact guard assistance to bedside commode. Bed alarm activated and audible, pt and family requires reinforcement to call staff for assistance with ambulation/getting out of bed. Worked with PT/OT. Remained free from falls and injury this shift. Bed in low and locked position with call bell and tray table within reach.    Problem: Adult Inpatient Plan of Care  Goal: Plan of Care Review  Outcome: Progressing  Goal: Patient-Specific Goal (Individualized)  Outcome: Progressing  Goal: Absence of Hospital-Acquired Illness or Injury  Outcome: Progressing  Intervention: Identify and Manage Fall Risk  Recent Flowsheet Documentation  Taken 11/08/2022 0724 by Dell Ponto, RN  Safety Interventions:   commode/urinal/bedpan at bedside   fall reduction program maintained   family at bedside   lighting adjusted for tasks/safety   low bed   bed alarm   nonskid shoes/slippers when out of bed   no IV/BP/blood draw left arm  Intervention: Prevent Skin Injury  Recent Flowsheet Documentation  Taken 11/08/2022 0724 by Dell Ponto, RN  Device Skin Pressure Protection:   absorbent pad utilized/changed   adhesive use limited  Skin Protection:   adhesive use limited   incontinence pads utilized  Intervention: Prevent and Manage VTE (Venous Thromboembolism) Risk  Recent Flowsheet Documentation  Taken 11/08/2022 0910 by Dell Ponto, RN  VTE Prevention/Management: ambulation promoted  Intervention: Prevent Infection  Recent Flowsheet Documentation  Taken 11/08/2022 0724 by Dell Ponto, RN  Infection Prevention:   cohorting utilized   hand hygiene promoted   personal protective equipment utilized   rest/sleep promoted   single patient room provided  Goal: Optimal Comfort and Wellbeing  Outcome: Progressing  Goal: Readiness for Transition of Care  Outcome: Progressing  Goal: Rounds/Family Conference  Outcome: Progressing     Problem: Fall Injury Risk  Goal: Absence of Fall and Fall-Related Injury  Outcome: Progressing  Intervention: Promote Injury-Free Environment  Recent Flowsheet Documentation  Taken 11/08/2022 0724 by Dell Ponto, RN  Safety Interventions:   commode/urinal/bedpan at bedside   fall reduction program maintained   family at bedside   lighting adjusted for tasks/safety   low bed   bed alarm   nonskid shoes/slippers when out of bed   no IV/BP/blood draw left arm     Problem: Self-Care Deficit  Goal: Improved Ability to Complete Activities of Daily Living  Outcome: Progressing     Problem: Infection  Goal: Absence of Infection Signs and Symptoms  Outcome: Progressing  Intervention: Prevent or Manage Infection  Recent Flowsheet Documentation  Taken 11/08/2022 0724 by Dell Ponto, RN  Infection Management: aseptic technique maintained     Problem: Wound  Goal: Optimal Coping  Outcome: Progressing  Goal: Optimal Functional Ability  Outcome: Progressing  Intervention: Optimize Functional Ability  Recent Flowsheet Documentation  Taken 11/08/2022 0724 by Dell Ponto, RN  Activity Management: up to bedside commode  Goal: Absence of Infection Signs and Symptoms  Outcome: Progressing  Intervention: Prevent or Manage Infection  Recent Flowsheet Documentation  Taken 11/08/2022 0724 by Dell Ponto, RN  Infection Management: aseptic technique maintained  Goal: Improved Oral Intake  Outcome: Progressing  Goal: Optimal Pain Control and Function  Outcome: Progressing  Goal:  Skin Health and Integrity  Outcome: Progressing  Intervention: Optimize Skin Protection  Recent Flowsheet Documentation  Taken 11/08/2022 0724 by Dell Ponto, RN  Activity Management: up to bedside commode  Pressure Reduction Techniques:   frequent weight shift encouraged   heels elevated off bed  Head of Bed (HOB) Positioning: HOB at 30-45 degrees  Pressure Reduction Devices:   positioning supports utilized   pressure-redistributing mattress utilized  Skin Protection:   adhesive use limited   incontinence pads utilized  Goal: Optimal Wound Healing  Outcome: Progressing     Problem: Comorbidity Management  Goal: Blood Glucose Levels Within Targeted Range  Outcome: Progressing  Intervention: Monitor and Manage Glycemia  Recent Flowsheet Documentation  Taken 11/08/2022 0724 by Dell Ponto, RN  Glycemic Management:   blood glucose monitored   supplemental insulin given  Goal: Blood Pressure in Desired Range  Outcome: Progressing

## 2022-11-08 NOTE — Unmapped (Signed)
IMMUNOCOMPROMISED HOST INFECTIOUS DISEASE PROGRESS NOTE    Assessment/Plan:     Julie [name] is a 72 y.o. 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 Had perinephric drain in place postoperatively, removed 08/30/22  - Immunosuppression: Tacrolimus (goal 6-8), MMF 500 BID (decreased to 250mg  BID during hospitalization), Pred 5  - Prophylaxis: valganciclovir x 6 months (mod risk), TMP+SMX x 6 months  Estimated Creatinine Clearance: 34.4 mL/min (A) (based on SCr of 1.15 mg/dL (H)).      Pertinent comorbidities:  ESRD was on PD and HD  DM II (A1c 5.6 on 08/06/22)  CAD  HLD  SBO 10/07/22 s/p ex lap 10/13/22 w/ lysis of adhesions     Pertinent exposures:  Originally from [place]   Treated with ivermectin at the ID clinic in 2018     Summary of pertinent prior infections:  #History of Shingles 2017  #04/2018 Dialysis fluid Serratia R: Ampicillin, Unasyn, Cefazolin,S: Ceftriaxone, Gentamicin, Levo, PipTazo, Tobra  #Thrush w/presumed candidal esophagitis 10/10/22 s/p 14 day treatment fluconazole  #Donor urine cx with C. Dubliensis 08/05/22, <10K, negative recipient samples, negative donor blood cx, treated with 2 weeks PO fluconazole  # Recurrent E.Coli Bacteremia/UTI  - 1/29 BCx/UCx E. Coli - s/p Levo x 3 weeks (thru stent removal, 3/15) (S: Cefazolin, Cipro/Levo, Pip-tazo, Cephalexin; R: TMP-SMX, Amp, Amp-sulb)  - 3/14 BCx/UCx ESBL/FQ-R E. 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 Coli (S: Cefepime SDD, Erta/Mero, [name], Tobra; I: Ceftaz, R: Amp, Atreonam, Cefazolin, Ceftriaxone, Cipro/Levo); UCx 10-50K E. Coli (S: Cefepime SDD, Erta/Mero, gent, nitrofurantoin, tobra; I: Ceftaz; R: Amp, Aztreonam, Cefazolin, Ceftriaxone, Cephalexin, Cipro/Levo, tetra, TMP-SMX)   - 4/14 BCx 1/1: (NGTD); serum Crypto Neg  - 4/15 BCx (NGTD), serum crypto neg  - 4/16 serum VZV neg, BCx pending  Rx: 4/13 Vanc/Pip-tazo--> 4/14 Vancomycin/Meropenem ---> 4/15 Vanc/Mero/IV acyclovir--> 4/17 Vanc/Mero     #Diarrhea  -4/13: C diff (-) GIPP neg     # Perinephric fluid collection 10/05/22, re-accumulated after JP drain removal 08/30/22- low suspicion for infection at this time   - 3/19 IR guided fluid collection drainage: bacterial and AFB cultures negative, Cr 1.5 (not suggestive of urinoma)  - 4/13: Transplanted renal U/S with perinephric fluid - decreased in size from prior   - 4/13 CT Ab/P with IV contrast - increased inflammatory stranding surrounding the LLQ transplanted kidney with crescentric fluid collection. Mildly prominent gas and fluid filled loops of small bowel.   - 4/14 Evaluation by transplant surgery - no surgical indication and they signed off.    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  - 06/06/22 hepB surface Ab positive, surface Ag neg  - 08/11/22 HBV DNA  not detected  - 09/06/22 HBV DNA not detected    Antimicrobial allergy/intolerance:   Penicillin (see allergy list for nuances)  Valacyclovir - nausea         RECOMMENDATIONS    Diagnosis  F/u 4/14 BCx (NGTD)  F/u 4/15 BCx (NGTD)  F/u 4/16 Bcx  Consider RPP, CT chest if fevers continue    Management  Continue IV Meropenem renally dosed equiv of 1g q8h  Continue IV Vancomycin, per pharmacy dosing (Goal trough: 10-15)   If possible would have Urogyn see patient while hospitalized to help provide interventions to reduce the risk of recurrence   Would re-explore coverage for fosfomycin (and possibly Tyro coverage) in light of recurrent hospitalizations. Could also consider methenamine + vitamin C for UTI ppx if fosfomycin cost-prohibitive  Consider alternative to Cellcept (Myfortic?) given diarrhea. In the meantime, recommend imodium given basic infectious wu of diarrhea is negative (I worry diarrhea is contributing to her recurrent UTI)    Antimicrobial prophylaxis required for transplant immunosuppression   Valganciclovir renally dosed equiv of 900 mg daily x 6 months (moderate risk)  TMP+SMX renally dosed equiv of 400/80 po MWF x 6 months  Needs LFT/hepB monitoring as below    Intensive toxicity monitoring for prescription antimicrobials   CBC w/diff at least once per week  CMP at least once per week  clinical assessments for rashes or other skin changes  Vancomycin trough (goal 10-15)  Given hepB core+, needs LFTs q27m for first year, HBSAg and HBV DNA q48m for first year and with any rise in LFTs    The [facility] ID APP service will continue to follow.           Please page Darolyn Rua, NP at [phone] from 8-4:30pm, after 4:30 pm & on weekends, please page the ID Transplant/Liquid Oncology Fellow consult at [phone] with questions.    Patient discussed with Dr. Michiel Cowboy.      Darolyn Rua, MSN, APRN, AGNP-C  Immunocompromised Infectious Disease Nurse Practitioner    I personally spent 20 minutes face-to-face and non-face-to-face in the care of this patient, which includes all pre, intra, and post visit time on the date of service.  All documented time was specific to the E/M visit and does not include any procedures that may have been performed.      Subjective:     External record(s): Primary team note: reviewed and noted plan to increase coreg dose due to ongoing hypertension.  .    Independent historian(s): no independent historian required.       Interval History:   T-max 38.7 overnight. Patient denies pain, dysuria, n/v and trouble breathing.     Medications:  Current Medications as of 11/08/2022  Scheduled  PRN   amlodipine, 5 mg, Q12H SCH  aspirin, 81 mg, Daily  atorvastatin, 10 mg, Daily  carvedilol, 25 mg, BID  estradiol, 2 g, Nightly  heparin (porcine) for subcutaneous use, 5,000 Units, Q8H [facility]  [Provider Hold] insulin glargine, 6 Units, Nightly  insulin lispro, 0-20 Units, ACHS  levothyroxine, 112 mcg, daily  meropenem, 1 g, Q12H  mycophenolate, 250 mg, BID  pantoprazole, 40 mg, Daily  predniSONE, 5 mg, Daily  sulfamethoxazole-trimethoprim, 1 tablet, Q MWF  tacrolimus, 1 mg, Daily   And  tacrolimus, 1 mg, Nightly  valGANciclovir, 450 mg, Every other day  vancomycin, 750 mg, Q24H      acetaminophen, 650 mg, Q6H PRN  aluminum-magnesium hydroxide-simethicone, 30 mL, Q4H PRN  dextrose in water, 12.5 g, Q10  Min PRN  glucagon, 1 mg, Once PRN  glucose, 16 g, Q10 Min PRN  guaiFENesin, 200 mg, Q4H PRN  melatonin, 3 mg, Nightly PRN  ondansetron, 4 mg, Q8H PRN   Or  ondansetron, 4 mg, Q8H PRN  polyethylene glycol, 17 g, Daily PRN         Objective:     Vital Signs last 24 hours:  Temp:  [36.6 ??C (97.9 ??F)-38.7 ??C (101.7 ??F)] 36.9 ??C (98.4 ??F)  Heart Rate:  [71-87] 71  Resp:  [20-22] 20  BP: (143-199)/(35-55) 172/49  MAP (mmHg):  [67-94] 83  SpO2:  [89 %-98 %] 95 %    Physical Exam:   Patient Lines/Drains/Airways Status       Active Active Lines, Drains, & Airways       Name Placement date Placement time Site Days    Peripheral IV 11/04/22 Anterior;Right;Upper Arm 11/04/22  2159  Arm  3    Peripheral IV 11/06/22 Anterior;Right Forearm 11/06/22  1603  Forearm  1    Arteriovenous Fistula - Vein Graft  Access 08/24/16 Left;Upper Arm 08/24/16  --  Arm  2267                  Const [x]  vital signs above    []  NAD, non-toxic appearance []  Chronically ill-appearing, non-distressed  Sitting up in bed      Eyes [x]  Lids normal bilaterally, conjunctiva anicteric and noninjected OU     [] PERRL  [] EOMI        ENMT [x]  Normal appearance of external nose and ears, no nasal discharge        [x]  MMM, no lesions on lips or gums []  No thrush, leukoplakia, oral lesions  []  Dentition good []  Edentulous []  Dental caries present  []  Hearing normal  []  TMs with good light reflexes bilaterally         Neck [x]  Neck of normal appearance and trachea midline        []  No thyromegaly, nodules, or tenderness   []  Full neck ROM        Lymph []  No LAD in neck     []  No LAD in supraclavicular area     []  No LAD in axillae   []  No LAD in epitrochlear chains     []  No LAD in inguinal areas        CV []  RRR            []  No peripheral edema     []  Pedal pulses intact   []  No abnormal heart sounds appreciated   [x]  Extremities WWP   Murmur present      Resp [x]  Normal WOB at rest    []  No breathlessness with speaking, no coughing  [x]  CTA anteriorly    []  CTA posteriorly    Crackles posteriorly      GI [x]  Normal inspection, NTND   []  NABS     []  No umbilical hernia on exam       []  No hepatosplenomegaly     []  Inspection of perineal and perianal areas normal        GU []  Normal external genitalia     [x] No urinary catheter present in urethra   []  No CVA tenderness    []  No tenderness over renal allograft        MSK []  No clubbing or cyanosis of hands       [x]  No vertebral point tenderness  []  No  focal tenderness or abnormalities on palpation of joints in RUE, LUE, RLE, or LLE        Skin [x]  No rashes, lesions, or ulcers of visualized skin     []  Skin warm and dry to palpation   Scab under left knee      Neuro [x]  Face expression symmetric  []  Sensation to light touch grossly intact throughout    [x]  Moves extremities equally    []  No tremor noted        []  CNs II-XII grossly intact     []  DTRs normal and symmetric throughout []  Gait unremarkable        Psych [x]  Appropriate affect       []  Fluent speech         [x]  Attentive, good eye contact  []  Oriented to person, place, time          []  Judgment and insight are appropriate           Data for Medical Decision Making     11/04/22 EKG QTcF    I discussed not done.    I reviewed CBC results (WBC low today), chemistry results (creatinine improved today), and Phos WNL .    I independently visualized/interpreted not done.       Recent Labs   Lab Units 11/08/22  1027 11/05/22  0836 11/04/22  2153   WBC 10*9/L 3.1*   < > 10.1   HEMOGLOBIN g/dL 7.4*   < > 9.4*   PLATELET COUNT (1) 10*9/L 56*   < > 86*   NEUTRO ABS 10*9/L 2.5   < > 9.4*   LYMPHO ABS 10*9/L 0.2*   < > 0.4*   EOSINO ABS 10*9/L 0.0   < > 0.0   BUN mg/dL 24*   < > 45*   CREATININE mg/dL 1.61*   < > 0.96*   AST U/L  --   --  34 - 31   ALT U/L  --   --  31 - 32   BILIRUBIN TOTAL mg/dL  --   --  0.3 - 0.3   ALK PHOS U/L  --   --  89 - 90   POTASSIUM mmol/L 4.0   < > 4.4   MAGNESIUM mg/dL  --   --  1.3*   PHOSPHORUS mg/dL 2.6 - 2.7   < >  --    CALCIUM mg/dL 7.9*   < > 8.8    < > = values in this interval not displayed.     Lab Results   Component Value Date    TACROLIMUS 3.3 (L) 11/08/2022    VANCORANDOM 6.3 11/06/2022         Microbiology:  Microbiology Results (last day)       Procedure Component Value Date/Time Date/Time    Blood Culture [[id number]]  (Normal) Collected: 11/05/22 0838    Lab Status: Preliminary result Specimen: Blood from 1 Peripheral Draw Updated: 11/08/22 0900     Blood Culture, Routine No Growth at 72 hours    Blood Culture [[id number]] Collected: 11/07/22 2359    Lab Status: In process Specimen: Blood from 1 Peripheral Draw Updated: 11/08/22 0012    Blood Culture [[id number]] Collected: 11/07/22 2359    Lab Status: In process Specimen: Blood from 1 Peripheral Draw Updated: 11/08/22 0006    Blood Culture, Adult [[id number]]  (Normal) Collected: 11/06/22 1617    Lab Status: Preliminary result Specimen: Blood from 1 Peripheral Draw Updated: 11/07/22  1645     Blood Culture, Routine No Growth at 24 hours    Blood Culture, Adult [[id number]] (Normal) Collected: 11/06/22 1623    Lab Status: Preliminary result Specimen: Blood from 1 Peripheral Draw Updated: 11/07/22 1645     Blood Culture, Routine No Growth at 24 hours                Imaging:  11/07/22 CXR  Impression      Low lung volumes with increased bibasilar airspace disease, may represent worsening atelectasis versus aspiration      Prominent loops of small bowel within the visualized abdomen.

## 2022-11-08 NOTE — Unmapped (Signed)
Fever in the morning and on scheduled tylenol. Pt is drowsy after breakfast, she can hardly keep her eyes open and not respond to people well when she is like that.  Fully awake mid afternoon and stay awake till end of shift. When pt is awake she respond and move quite well, also start to observe and ask questions. IV Abx given. Walk with standby assist. Has dentures and pt daughter request to remove and clean daily in the evening.   Problem: Fall Injury Risk  Goal: Absence of Fall and Fall-Related Injury  Outcome: Progressing

## 2022-11-08 NOTE — Unmapped (Signed)
PHYSICAL THERAPY  Evaluation (11/08/22 0920)          Patient Name:  Julie [name]       Medical Record Number: [id number]   Date of Birth: 03-02-1951  Sex: Female        Treatment Diagnosis: decreased activity tolerance, decreased endurance, impaired balance     Activity Tolerance: Tolerated treatment well, Limited by fatigue     ASSESSMENT  Problem List: Gait deviation, Decreased mobility, Fall risk, Decreased endurance      Assessment : The patient is a 72 y.o. female with PMHx of DCD DDKT in 07/2022, hypertension, type 2 diabetes which is insulin dependent, hypothyroidism, recent admission 3/14-4/3 for GNR bacteremia also underwent exploratory laparotomy and reduction and small bowel volvulus on 10/13/22. She returns for nausea, vomiting, and lethargy found to have UTI and GNR bacteremia. The patient has improved, but is still having fevers and as such the team is requesting to see if aspiration is feasible of a peri-transplant collection.  Pt completes bed mobility at SBA level, transfers with rollator at [facility], and gait with rollator at [facility] with one LOB while patient was turning with gait.  Pt will benefit from skilled PT while in house to maximize safety and independence with mobiltiy.  Pt would benefit from 3x PT at discharge with no DME needs.      Today's Interventions: PT EVAL, AMPAC, education on PT role in hospital and POC, safety with mobility, safe mobility with staff.         Examination of Body System: Musculoskeletal, Activity/participation  Clinical Presentation: Evolving    Clinical Decision Making: Low        PLAN  Planned Frequency of Treatment:  1-2x per day for: 3-4x week Planned Treatment Duration: 11/22/22     Planned Interventions:       Post-Discharge Physical Therapy Recommendations:  PT Post Acute Discharge Recommendations: 3x weekly      PT DME Recommendations: None            Goals:   Patient and Family Goals: patient wants to go home     SHORT GOAL #1: Patient will complete all bed mobility with minA from a flat bed               Time Frame : 2 weeks  SHORT GOAL #2: Patient will perform OOB functional transfers with LRAD and SBA              Time Frame : 2 weeks  SHORT GOAL #3: Patient will ambulate x179ft with LRAD and SBA              Time Frame : 2 weeks  SHORT GOAL #4: Pt will demonstrate safe negotiation x3 stairs with single HR and CGA               Time Frame : 2 weeks                         Prognosis:  Good  Positive Indicators: PLOF, caregiver support  Barriers to Discharge: Decreased safety awareness, Endurance deficits, Functional strength deficits, Impaired Balance, Inability to safely perform ADLS, Poor insight into deficits     SUBJECTIVE  Patient reports: I want to go  home  Medical Updates Since Last Visit: Pt seen for PT evaluation.  Received and left sitting up in bed.  Rn aware.  Services patient receives prior to admission: PT (just finished with home  PT prior to hospital admission)  Prior Functional Status: Pt and husband report that she was mod I with a rollator to independent wiht no AD at home prior to admission.  Pt lives in a one level home with 2 STE with rail.  Pt spouse reports he can be available to assist at home as neede.d  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)     since July 2017    Hypertension     Hypothyroidism (acquired)     Kidney transplant status, cadaveric 08/08/2022    Type 2 diabetes mellitus (CMS-HCC)             Social History     Tobacco Use    Smoking status: Former     Current packs/day: 0.00     Average packs/day: 0.8 packs/day for 30.0 years (22.5 ttl pk-yrs)     Types: Cigarettes     Start date: 06/06/1976     Quit date: 06/06/2006     Years since quitting: 16.4    Smokeless tobacco: Former   Substance Use Topics    Alcohol use: Yes     Comment: only special occasions       Past Surgical History:   Procedure Laterality Date    CATARACT EXTRACTION      HYSTERECTOMY      OOPHORECTOMY      PERITONEAL CATHETER INSERTION      PR LAP INSERTION TUNNELED INTRAPERITONEAL CATHETER N/A 09/27/2018    Procedure: LAPAROSCOPY, SURGICAL; WITH INSERTION OF INTRAPERITONEAL CANNULA OR CATHETER, PERMANENT;  Surgeon: Leona Carry, MD;  Location: MAIN OR [place];  Service: Transplant    PR LAP INSERTION TUNNELED INTRAPERITONEAL CATHETER N/A 04/08/2019    Procedure: LAPAROSCOPY, SURGICAL; WITH INSERTION OF INTRAPERITONEAL CANNULA OR CATHETER, PERMANENT;  Surgeon: Leona Carry, MD;  Location: MAIN OR [place];  Service: Transplant    PR LAP REVISE INTRAPERITONEAL CATHETER N/A 08/26/2019    Procedure: LAPAROSCOPY, SURGICAL; W/REVIS PREV PLACED INTRAPERITONEAL CANNULA/CATH, REMOV INTRALUMIN OBSTRUCT MATERIAL;  Surgeon: Leona Carry, MD;  Location: MAIN OR [facility];  Service: Transplant    PR REDUCE VOLVULUS,INTUSS,INTERN HERNIA N/A 10/13/2022    Procedure: REDUCTION OF VOLVULUS, INTUSSUSCEPTION, INTERNAL HERNIA, BY LAPAROTOMY;  Surgeon: Suella Broad, MD;  Location: MAIN OR [place];  Service: Trauma    PR REMOVAL TUNNELED INTRAPERITONEAL CATHETER N/A 05/06/2018    Procedure: REMOVAL OF PERMANENT INTRAPERITONEAL CANNULA OR CATHETER;  Surgeon: Leona Carry, MD;  Location: MAIN OR [facility];  Service: Transplant    PR REMOVE PERITONEAL FOREIGN BODY N/A 04/08/2019    Procedure: Removal Of Peritoneal Of Foreign Body From Peritoneal Cavity;  Surgeon: Leona Carry, MD;  Location: MAIN OR [facility];  Service: Transplant    PR TRANSPLANT,PREP RENAL GRAFT/ARTERIAL Right 08/06/2022    Procedure: [facility] RECONSTRUCTION CADAVER/LIVING DONOR RENAL ALLOGRAFT PRIOR TO TRANSPLANT; ARTERIAL ANASTOMOSIS EAC;  Surgeon: Toledo, Lilyan Punt, MD;  Location: MAIN OR [facility];  Service: Transplant    PR TRANSPLANTATION OF KIDNEY Right 08/06/2022 Procedure: RENAL ALLOTRANSPLANTATION, IMPLANTATION OF GRAFT; WITHOUT RECIPIENT NEPHRECTOMY;  Surgeon: Toledo, Lilyan Punt, MD;  Location: MAIN OR [facility];  Service: Transplant    TUBAL LIGATION  Family History   Problem Relation Age of Onset    Diabetes Mother     Alzheimer's disease Father     Diabetes Sister     Diabetes Sister     Diabetes Sister     Anesthesia problems Neg Hx     Bleeding Disorder Neg Hx         Allergies: Oxycodone, Gabapentin, and Valacyclovir                  Objective Findings  Precautions / Restrictions  Precautions: Falls precautions  Weight Bearing Status: Non-applicable  Required Braces or Orthoses: Non-applicable     Communication Preference: Verbal          Pain Comments: denies pain  Medical Tests / Procedures: Medical record reviewed  Equipment / Environment: Vascular access (PIV, TLC, Port-a-cath, PICC), Telemetry     Vitals/Orthostatics : BP elevated 188/57, below patient parameters, stable response to session     Living Situation  Living Environment: House  Lives With: Spouse  Home Living: One level home, Stairs to enter with rails, Tub/shower unit, Standard height toilet, Shower chair with back, Walk-in shower  Rail placement (outside): Rail on right side  Number of Stairs to Enter (outside): 3  Caregiver Identified?: Yes  Caregiver Availability: 24 hours  Caregiver Ability: Limited lifting      Cognition: WFL  Visual/Perception: Within Functional Limits  Hearing: No deficit identified  Hearing: Pt donned hearing aides for the second half of this session, and her hearing improved.           Upper Extremities  UE ROM: Right WFL, Left WFL  UE Strength: Right WFL, Left WFL    Lower Extremities  LE ROM: Left WFL, Right WFL  LE Strength: Right WFL, Left WFL          Coordination: WFL  Proprioception: WFL  Posture: WFL    Static Sitting-Level of Assistance: Independent  Dynamic Sitting-Level of Assistance: Clinical biochemist Standing-Level of Assistance: Stand by assistance  Dynamic Standing - Level of Assistance: Contact guard  Standing Balance comments: with rollator, pt had small LOB during ambulation when turning and looking up to speak to PT at the same time      Bed Mobility: Rolling Right  Rolling right assistance level: Independent  Supine to Sit assistance level: Standby assist, set-up cues, supervision of patient - no hands on  Bed Mobility: HOB elevated, use of rail     Transfers: Sit to Stand  Sit to Stand assistance level: Contact guard assist, steadying assist  Transfer comments: with rollator      Gait Level of Assistance: Contact guard assist, steadying assist  Gait Assistive Device: Four wheel walker  Gait Distance Ambulated (ft): 90 ft  Skilled Treatment Performed: ambulated 90 feet with CGA and rollator, pt with LOB during turn in hallway when she was also looking up to speak to PT     Stairs: NT directly - anticipate no difficulty with stair navigation given ambulation and transfer performance            Endurance: Good    Patient at end of session: All needs in reach, Alarm activated, In bed, Friends/Family present    Physical Therapy Session Duration  PT Individual [mins]: 20     Medical Staff Made Aware: RN notified    I attest that I have reviewed the above information.  Signed: Earna Coder Shajuan Musso, PT  Filed 11/08/2022

## 2022-11-08 NOTE — Unmapped (Signed)
Nephrology ([facility]) Progress Note    Assessment & Plan:   Julie [name] is a 72 y.o. 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 She returns for nausea, vomiting, and lethargy found to have UTI and GNR bacteremia.     Principal Problem:    UTI (urinary tract infection)  Active Problems:    Type 2 diabetes mellitus with diabetic nephropathy, with long-term current use of insulin (CMS-HCC)    End-stage renal disease (CMS-HCC)    Kidney transplant recipient    Anemia due to stage 5 chronic kidney disease, not on chronic dialysis (CMS-HCC)    Delirium  Resolved Problems:    * No resolved hospital problems. *    E.Coli Bacteremia likely 2/2 urinary source - Hx MDR UTIs - Transplanted kidney  Pt found to have UTI on urinalysis and GNR bacteremia on blood cx. She was recently admitted 3/14 - 4/3 for GNR bacteremia and finished course of meropenam (3/15 - 4/3) prior to discharge. Started on vanc/mero. Urine cx positive for E coli, susceptibilities still pending. No fevers over last 28 hours which is reassuring at this time. Per transplant team would not be a candidate for home infusion. Based on improvement will discuss antibiotic regimen and adjustments with ICID today with hope to come up with definitive antibiotic plan for ultimate disposition purposes.   - Vancomycin (4/14-) and meropenem 1g q12 (4/14-4/24)  - ICID following, appreciate recommendations  - Consider tunneled line if patient needs to get IV antibiotics at home vs here in hospital PIV is okay  - PRN tylenol 1g q8  - Referred to urogyn on recent discharge given recurrent UTI, appt has not been scheduled yet  - continue estrogen topical for UTI ppx    AMS (resolved) - Acute Toxic Metabolic Encephalopathy  Patient's mental status now back at baseline, A&O x4 asking when she can leave the hospital.  She was responding appropriately and alert and oriented. Patient has had waxing and waning AMS. Initially concerned for encephalitis/meningitis given prior fevers and mental status but given improvement in mental status and resolution of fevers this is now a low concern. VZV pcr negative and crypto antigen negative. Ongoing discussion with ICID as above regarding antibiotic regimen.   - HSV 1/2 PCR, fungal pathogen culture, pending  - Treat infection as above  - Normal diet      AKI - S/p DDKT 08/06/22   Follows with Dr. Elvera Maria. 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 Renal US 4/14 found decreased size of perinephric collection around transplanted kidney as compared to previous renal US. VIR reconsulted for possible perinephric fluid drainage for potential source control but felt location made this extremely risky with difficulty to effectively drain.   - Daily Renal function panel   - Home immunosuppression  - Tacrolimus 1mg  BID, daily tac levels   - Continue Prednisone 5 daily   - Cellcept 250mg  BID in setting of infection  - Prophylaxis: bactrim and valcyte daily  - Cont daily aspirin       Acute on Chronic Thrombocytopenia (stable) - Anemia on CKD  Suspect in the setting of immunosuppression for kidney transplant. Was transfused last admission. Hgb currently stable at 9.4. Plts continue to decline, at 50 on 4/16 down from 144 on  admission. DIC profile largely unremarkable this AM.   - Daily CBC; transfuse if Hb<7     HTN  Hypertensive on admission but will be cautious with antihypertensives given SIRS picture and potential to decompensate quickly from G- sepsis. Ongoing hypertension with SBP up to 187 overnight. Ongoing titration of home BP medications.   - Continue coreg to 25mg  bid (home dose)  - Continue amlodipine 5mg  bid  - Hold chlorthalidone 25mg  and daily hydral 50mg  while infected T2DM, insulin-dependent   Last A1C 5.6. Home regimen was Lantus 6 units nightly.  - Hold Lantus 6u nightly     APS report filed and pending  Per social work and transplant team. Daughters initiating guardianship application. APS report is pending. Case will be continued to be followed with transplant case manager Lowella Petties. [place] [facility] remain involved. Pt resides with husband and daughters manage meds.   - Appreciate CM involvement   - Current HCDM: Husband [name] Macmillan     Chronic issues    Recent C/f thrush, candida esophagitis  During last admission. Immunosuppressed. IV micafungin 3/19 and transitioned to fluconazole on 3/26-4/3. No signs currently.    HLD- cont home statin    The patient's presentation is complicated by the following clinically significant conditions requiring additional evaluation and treatment: - Hypercoagulable state requiring additional attention to DVT prophylaxis and treatment  - Thrombocytopenia POA requiring further investigation or monitor  - Chronic kidney disease POA requiring further investigation, treatment, or monitoring   - Volume depletion POA requiring further investigation, treatment, or monitoring  - Complex social situation/SDOH requiring consultation and support of Care Management       Issues Impacting Complexity of Management:  -The patient is at high risk of complications from immunosuppression, antibiotic side effects, transplant complications    Checklist:  Diet: Regular  DVT PPx: Heparin 5000units q8h  Code Status: Full Code  Dispo: Pending ABX plan, home infusion is currently not an option due to ongoing APS investigation. Patient will likely need to remain inpatient to finish 14 total days of antibiotics until at least 4/27    Team Contact Information:   Primary Team: Nephrology ([facility])  Primary Resident: Marcellus Scott, MD  Resident's Pager: [phone] (Nephrology Intern - [facility])    Subjective:   No acute overnight events. Febrile yesterday    Objective:   Physical Exam:  BP 199/53  - Pulse 87  - Temp 37 ??C (98.6 ??F) (Oral)  - Resp 20  - Ht 152.4 cm (5')  - Wt 56.7 kg (124 lb 14.3 oz)  - SpO2 95%  - BMI 24.39 kg/m??     Gen: Tired appearing but A&O x4  HEENT: Atraumatic, normocephalic  CV: RRR, systolic ejection murmur at LUSB  Pulm: CTA bilaterally, symetric chest rise  Abd: Soft, NTND, surgical staples in abdomen are c/d/I without sign of infection, no tenderness over renal graft site  Ext: No edema, warm and well perfused    Clotilde Dieter, MD  [facility] Internal Medicine, PGY-1

## 2022-11-09 DIAGNOSIS — Z940 Kidney transplant status: Principal | ICD-10-CM

## 2022-11-09 LAB — RENAL FUNCTION PANEL
ALBUMIN: 2.4 g/dL — ABNORMAL LOW (ref 3.4–5.0)
ANION GAP: 6 mmol/L (ref 5–14)
BLOOD UREA NITROGEN: 18 mg/dL (ref 9–23)
BUN / CREAT RATIO: 19
CALCIUM: 7.9 mg/dL — ABNORMAL LOW (ref 8.7–10.4)
CHLORIDE: 111 mmol/L — ABNORMAL HIGH (ref 98–107)
CO2: 21 mmol/L (ref 20.0–31.0)
CREATININE: 0.94 mg/dL
EGFR CKD-EPI (2021) FEMALE: 64 mL/min/{1.73_m2} (ref >=60)
GLUCOSE RANDOM: 219 mg/dL — ABNORMAL HIGH (ref 70–179)
PHOSPHORUS: 2 mg/dL — ABNORMAL LOW (ref 2.4–5.1)
POTASSIUM: 4.1 mmol/L (ref 3.4–4.8)
SODIUM: 138 mmol/L (ref 135–145)

## 2022-11-09 LAB — CBC
HEMATOCRIT: 22.9 % — ABNORMAL LOW (ref 34.0–44.0)
HEMOGLOBIN: 7.8 g/dL — ABNORMAL LOW (ref 11.3–14.9)
MEAN CORPUSCULAR HEMOGLOBIN CONC: 34.1 g/dL (ref 32.0–36.0)
MEAN CORPUSCULAR HEMOGLOBIN: 32.4 pg (ref 25.9–32.4)
MEAN CORPUSCULAR VOLUME: 95.2 fL (ref 77.6–95.7)
MEAN PLATELET VOLUME: 8.9 fL (ref 6.8–10.7)
PLATELET COUNT: 66 10*9/L — ABNORMAL LOW (ref 150–450)
RED BLOOD CELL COUNT: 2.4 10*12/L — ABNORMAL LOW (ref 3.95–5.13)
RED CELL DISTRIBUTION WIDTH: 15.1 % (ref 12.2–15.2)
WBC ADJUSTED: 2.8 10*9/L — ABNORMAL LOW (ref 3.6–11.2)

## 2022-11-09 LAB — TACROLIMUS LEVEL, TROUGH: TACROLIMUS, TROUGH: 6.1 ng/mL (ref 5.0–15.0)

## 2022-11-09 MED ADMIN — mycophenolate (CELLCEPT) capsule 250 mg: 250 mg | ORAL | @ 01:00:00

## 2022-11-09 MED ADMIN — carvedilol (COREG) tablet 25 mg: 25 mg | ORAL | @ 01:00:00

## 2022-11-09 MED ADMIN — estradiol (ESTRACE) 0.01 % (0.1 mg/gram) vaginal cream 2 g: 2 g | VAGINAL | @ 01:00:00 | Stop: 2022-11-19

## 2022-11-09 MED ADMIN — heparin (porcine) 5,000 unit/mL injection 5,000 Units: 5000 [IU] | SUBCUTANEOUS | @ 09:00:00

## 2022-11-09 MED ADMIN — amlodipine (NORVASC) tablet 5 mg: 5 mg | ORAL | @ 01:00:00

## 2022-11-09 MED ADMIN — carvedilol (COREG) tablet 25 mg: 25 mg | ORAL | @ 12:00:00

## 2022-11-09 MED ADMIN — levothyroxine (SYNTHROID) tablet 112 mcg: 112 ug | ORAL | @ 09:00:00

## 2022-11-09 MED ADMIN — tacrolimus (PROGRAF) capsule 1 mg: 1 mg | ORAL | @ 15:00:00

## 2022-11-09 MED ADMIN — predniSONE (DELTASONE) tablet 5 mg: 5 mg | ORAL | @ 12:00:00

## 2022-11-09 MED ADMIN — insulin lispro (HumaLOG) injection 0-20 Units: 0-20 [IU] | SUBCUTANEOUS | @ 16:00:00

## 2022-11-09 MED ADMIN — aspirin chewable tablet 81 mg: 81 mg | ORAL | @ 12:00:00

## 2022-11-09 MED ADMIN — meropenem (MERREM) 1 g in sodium chloride 0.9 % (NS) 100 mL IVPB-MBP: 1 g | INTRAVENOUS | @ 01:00:00 | Stop: 2022-11-11

## 2022-11-09 MED ADMIN — heparin (porcine) 5,000 unit/mL injection 5,000 Units: 5000 [IU] | SUBCUTANEOUS | @ 18:00:00

## 2022-11-09 MED ADMIN — mycophenolate (CELLCEPT) capsule 250 mg: 250 mg | ORAL | @ 12:00:00 | Stop: 2022-11-09

## 2022-11-09 MED ADMIN — amlodipine (NORVASC) tablet 5 mg: 5 mg | ORAL | @ 12:00:00

## 2022-11-09 MED ADMIN — meropenem (MERREM) 1 g in sodium chloride 0.9 % (NS) 100 mL IVPB-MBP: 1 g | INTRAVENOUS | @ 14:00:00 | Stop: 2022-11-18

## 2022-11-09 MED ADMIN — insulin lispro (HumaLOG) injection 0-20 Units: 0-20 [IU] | SUBCUTANEOUS | @ 22:00:00

## 2022-11-09 MED ADMIN — insulin lispro (HumaLOG) injection 0-20 Units: 0-20 [IU] | SUBCUTANEOUS | @ 04:00:00

## 2022-11-09 MED ADMIN — spironolactone (ALDACTONE) tablet 25 mg: 25 mg | ORAL | @ 16:00:00

## 2022-11-09 MED ADMIN — pantoprazole (Protonix) EC tablet 40 mg: 40 mg | ORAL | @ 12:00:00

## 2022-11-09 MED ADMIN — tacrolimus (PROGRAF) capsule 1 mg: 1 mg | ORAL | @ 01:00:00

## 2022-11-09 MED ADMIN — atorvastatin (LIPITOR) tablet 10 mg: 10 mg | ORAL | @ 12:00:00

## 2022-11-09 NOTE — Unmapped (Signed)
Nephrology ([facility]) Progress Note    Assessment & Plan:   Julie [name] is a 72 y.o. 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 She returns for nausea, vomiting, and lethargy found to have UTI and GNR bacteremia.     Principal Problem:    UTI (urinary tract infection)  Active Problems:    Type 2 diabetes mellitus with diabetic nephropathy, with long-term current use of insulin (CMS-HCC)    End-stage renal disease (CMS-HCC)    Kidney transplant recipient    Anemia due to stage 5 chronic kidney disease, not on chronic dialysis (CMS-HCC)    Delirium  Resolved Problems:    * No resolved hospital problems. *    E.Coli Bacteremia likely 2/2 urinary source - Hx MDR UTIs - Transplanted kidney  Pt found to have UTI on urinalysis and GNR bacteremia on blood cx. She was recently admitted 3/14 - 4/3 for GNR bacteremia and finished course of meropenam (3/15 - 4/3) prior to discharge. Started on vanc/mero. Urine cx positive for E coli, susceptibilities still pending. No fevers over last 28 hours which is reassuring at this time. Per transplant team would not be a candidate for home infusion. Based on improvement will discuss antibiotic regimen and adjustments with ICID today with hope to come up with definitive antibiotic plan for ultimate disposition purposes.   - Vancomycin (4/14-) and meropenem 1g q12 (4/14-4/24)  - ICID following, appreciate recommendations  - Consider tunneled line if patient needs to get IV antibiotics at home vs here in hospital PIV is okay  - PRN tylenol 1g q8  - Referred to urogyn on recent discharge given recurrent UTI, appt has not been scheduled yet  - continue estrogen topical for UTI ppx    AMS (resolved) - Acute Toxic Metabolic Encephalopathy  Patient's mental status now back at baseline, A&O x4 asking when she can leave the hospital.  She was responding appropriately and alert and oriented. Patient has had waxing and waning AMS. Initially concerned for encephalitis/meningitis given prior fevers and mental status but given improvement in mental status and resolution of fevers this is now a low concern. VZV pcr negative and crypto antigen negative. Ongoing discussion with ICID as above regarding antibiotic regimen.   - HSV 1/2 PCR, fungal pathogen culture, pending  - Treat infection as above  - Normal diet      AKI - S/p DDKT 08/06/22   Follows with Dr. Elvera [name]. 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 Renal US 4/14 found decreased size of perinephric collection around transplanted kidney as compared to previous renal US. VIR reconsulted for possible perinephric fluid drainage for potential source control but felt location made this extremely risky with difficulty to effectively drain. Cr back to baseline this AM at 0.94.   - Daily Renal function panel   - Home immunosuppression  - Tacrolimus 1mg  BID, daily tac levels   - Continue Prednisone 5 daily   - Cellcept 250mg  BID in setting of infection  - Prophylaxis: bactrim and valcyte daily  - Cont daily aspirin       Acute on Chronic Thrombocytopenia (stable) - Anemia on CKD  Suspect in the setting of immunosuppression for kidney transplant. Was transfused last admission. Hgb currently stable at 9.4. Plts continue to decline,  at 50 on 4/16 down from 144 on admission. DIC profile largely unremarkable this AM.   - Daily CBC; transfuse if Hb<7     HTN  Hypertensive on admission but will be cautious with antihypertensives given SIRS picture and potential to decompensate quickly from G- sepsis. Ongoing hypertension with SBP up to 187 overnight. Ongoing titration of home BP medications.   - Restarted spironolactone 25 mg daily per recommendation from transplant nephrology  - Continue coreg to 25mg  bid (home dose)  - Continue amlodipine 5mg  bid  - Hold chlorthalidone 25mg  and daily hydral 50mg  while infected      T2DM, insulin-dependent   Last A1C 5.6. Home regimen was Lantus 6 units nightly.  - Hold Lantus 6u nightly     S/p Ex-Lap on 3/22 - Staples in Place  Exploratory laparotomy on 3/22 for volvulus during prior admission. Staples still in place and have not yet been removed. Staples intact with no notable discharge around them.   - Paged surgery for clarification on timing of removal  - Plan to remove staples and placing steri strips after clarification of timing    APS report filed and pending  Per social work and transplant team. Daughters initiating guardianship application. APS report is pending. Case will be continued to be followed with transplant case manager Lowella Petties. [place] [facility] remain involved. Pt resides with husband and daughters manage meds. Clarified with patient today 4/18 with no family present at bedside that her husband Julie [name] is her stated preference HCDM.   - Appreciate CM involvement   - Current HCDM: Husband Julie [name] - clarified 4/18    Chronic issues    Recent C/f thrush, candida esophagitis  During last admission. Immunosuppressed. IV micafungin 3/19 and transitioned to fluconazole on 3/26-4/3. No signs currently.    HLD- cont home statin    The patient's presentation is complicated by the following clinically significant conditions requiring additional evaluation and treatment: - Hypercoagulable state requiring additional attention to DVT prophylaxis and treatment  - Thrombocytopenia POA requiring further investigation or monitor  - Chronic kidney disease POA requiring further investigation, treatment, or monitoring   - Volume depletion POA requiring further investigation, treatment, or monitoring  - Complex social situation/SDOH requiring consultation and support of Care Management       Issues Impacting Complexity of Management:  -The patient is at high risk of complications from immunosuppression, antibiotic side effects, transplant complications    Checklist:  Diet: Regular  DVT PPx: Heparin 5000units q8h  Code Status: Full Code  Dispo: Pending ABX plan, home infusion is currently not an option due to ongoing APS investigation. Patient will likely need to remain inpatient to finish 14 total days of antibiotics until at least 4/27    Team Contact Information:   Primary Team: Nephrology ([facility])  Primary Resident: Marcellus Scott, MD  Resident's Pager: [phone] (Nephrology Intern - [facility])    Subjective:   No acute overnight events. No fevers overnight. This morning feeling well asking when she can discharge from the hospital. Has no shortness of breath, chest pain, or abdominal pain. Asking when her prior staples can come out from Ex-Lap on 3/22.     Objective:   Physical Exam:  BP 163/52  - Pulse 67  - Temp 36.8 ??C (98.2 ??F) (Oral)  - Resp 18  - Ht 152.4 cm (5')  - Wt 56.7 kg (124 lb 14.3 oz)  - SpO2 95%  - BMI 24.39 kg/m??  Gen: Tired appearing but A&O x4  HEENT: Atraumatic, normocephalic  CV: RRR, systolic ejection murmur at LUSB  Pulm: CTA bilaterally, symetric chest rise  Abd: Soft, NTND, surgical staples in abdomen are c/d/I without sign of infection, no tenderness over renal graft site  Ext: No edema, warm and well perfused    Clotilde Dieter, MD  [facility] Internal Medicine, PGY-1

## 2022-11-09 NOTE — Unmapped (Signed)
Patient A&Ox3, VSS. Patient denied pain, shortness of breath, nausea, vomiting. Pt up with assistance to [facility] with good UOP, no bowel movement. Pt with decrease appetite. Blood glucose controlled per order. Pt was free from falls, call bell and bedside table within reach. Husband at bedside.    Problem: Adult Inpatient Plan of Care  Goal: Plan of Care Review  Outcome: Ongoing - Unchanged  Goal: Patient-Specific Goal (Individualized)  Outcome: Ongoing - Unchanged  Goal: Absence of Hospital-Acquired Illness or Injury  Outcome: Ongoing - Unchanged  Intervention: Identify and Manage Fall Risk  Recent Flowsheet Documentation  Taken 11/08/2022 2000 by Ananias Pilgrim, RN  Safety Interventions:   low bed   fall reduction program maintained  Intervention: Prevent and Manage VTE (Venous Thromboembolism) Risk  Recent Flowsheet Documentation  Taken 11/08/2022 2047 by Ananias Pilgrim, RN  VTE Prevention/Management: anticoagulant therapy  Goal: Optimal Comfort and Wellbeing  Outcome: Ongoing - Unchanged  Goal: Readiness for Transition of Care  Outcome: Ongoing - Unchanged  Goal: Rounds/Family Conference  Outcome: Ongoing - Unchanged     Problem: Fall Injury Risk  Goal: Absence of Fall and Fall-Related Injury  Outcome: Ongoing - Unchanged  Intervention: Promote Injury-Free Environment  Recent Flowsheet Documentation  Taken 11/08/2022 2000 by Ananias Pilgrim, RN  Safety Interventions:   low bed   fall reduction program maintained     Problem: Self-Care Deficit  Goal: Improved Ability to Complete Activities of Daily Living  Outcome: Ongoing - Unchanged     Problem: Infection  Goal: Absence of Infection Signs and Symptoms  Outcome: Ongoing - Unchanged     Problem: Wound  Goal: Optimal Coping  Outcome: Ongoing - Unchanged  Goal: Optimal Functional Ability  Outcome: Ongoing - Unchanged  Goal: Absence of Infection Signs and Symptoms  Outcome: Ongoing - Unchanged  Goal: Improved Oral Intake  Outcome: Ongoing - Unchanged  Goal: Optimal Pain Control and Function  Outcome: Ongoing - Unchanged  Goal: Skin Health and Integrity  Outcome: Ongoing - Unchanged  Goal: Optimal Wound Healing  Outcome: Ongoing - Unchanged     Problem: Comorbidity Management  Goal: Blood Glucose Levels Within Targeted Range  Outcome: Ongoing - Unchanged  Goal: Blood Pressure in Desired Range  Outcome: Ongoing - Unchanged

## 2022-11-09 NOTE — Unmapped (Signed)
Patient's husband called to cancel appt. Patiet is hospital admitted since 04/13. Did not want to reschedule.

## 2022-11-09 NOTE — Unmapped (Signed)
IMMUNOCOMPROMISED HOST INFECTIOUS DISEASE PROGRESS NOTE    Assessment/Plan:     Julie [name] is a 72 y.o. 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 Had perinephric drain in place postoperatively, removed 08/30/22  - Immunosuppression: Tacrolimus (goal 6-8), MMF 500 BID (decreased to 250mg  BID during hospitalization), Pred 5  - Prophylaxis: valganciclovir x 6 months (mod risk), TMP+SMX x 6 months  Estimated Creatinine Clearance: 42.1 mL/min (based on SCr of 0.94 mg/dL).      Pertinent comorbidities:  ESRD was on PD and HD  DM II (A1c 5.6 on 08/06/22)  CAD  HLD  SBO 10/07/22 s/p ex lap 10/13/22 w/ lysis of adhesions     Pertinent exposures:  Originally from [place]   Treated with ivermectin at the ID clinic in 2018     Summary of pertinent prior infections:  #History of Shingles 2017  #04/2018 Dialysis fluid Serratia R: Ampicillin, Unasyn, Cefazolin,S: Ceftriaxone, Gentamicin, Levo, PipTazo, Tobra  #Thrush w/presumed candidal esophagitis 10/10/22 s/p 14 day treatment fluconazole  #Donor urine cx with C. Dubliensis 08/05/22, <10K, negative recipient samples, negative donor blood cx, treated with 2 weeks PO fluconazole  # Recurrent E.Coli Bacteremia/UTI  - 1/29 BCx/UCx E. Coli - s/p Levo x 3 weeks (thru stent removal, 3/15) (S: Cefazolin, Cipro/Levo, Pip-tazo, Cephalexin; R: TMP-SMX, Amp, Amp-sulb)  - 3/14 BCx/UCx ESBL/FQ-R E. 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 Coli (S: Cefepime SDD, Erta/Mero, [name], Tobra; I: Ceftaz, R: Amp, Atreonam, Cefazolin, Ceftriaxone, Cipro/Levo); UCx 10-50K E. Coli (S: Cefepime SDD, Erta/Mero, gent, nitrofurantoin, tobra; I: Ceftaz; R: Amp, Aztreonam, Cefazolin, Ceftriaxone, Cephalexin, Cipro/Levo, tetra, TMP-SMX)   - 4/14 BCx 1/1: (NGTD); serum Crypto Neg  - 4/15 BCx (NGTD), serum crypto neg  - 4/16 serum VZV neg, BCx pending  Rx: 4/13 Vanc/Pip-tazo--> 4/14 Vancomycin/Meropenem ---> 4/15 Vanc/Mero/IV acyclovir--> 4/17 Vanc/Mero     # Perinephric fluid collection 10/05/22, re-accumulated after JP drain removal 08/30/22- low suspicion for infection at this time   - 3/19 IR guided fluid collection drainage: bacterial and AFB cultures negative, Cr 1.5 (not suggestive of urinoma)  - 4/13: Transplanted renal U/S with perinephric fluid - decreased in size from prior   - 4/13 CT Ab/P with IV contrast - increased inflammatory stranding surrounding the LLQ transplanted kidney with crescentric fluid collection. Mildly prominent gas and fluid filled loops of small bowel.   - 4/14 Evaluation by transplant surgery - no surgical indication and they signed off.    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  - 06/06/22 hepB surface Ab positive, surface Ag neg  - 08/11/22 HBV DNA not detected  - 09/06/22 HBV DNA not detected    Antimicrobial allergy/intolerance:  Penicillin (see allergy list for nuances)  Valacyclovir - nausea         RECOMMENDATIONS    Diagnosis  F/u 4/14 BCx (NGTD)  F/u 4/15 BCx (NGTD)  F/u 4/16 Bcx (NGTD)    Management  Continue IV Meropenem renally dosed equiv of 1g q8h  Duration: 14 days (4/14-4/27)  STOP IV Vancomycin, per pharmacy dosing (Goal trough: 10-15)     Antimicrobial prophylaxis required for transplant immunosuppression   Valganciclovir renally dosed equiv of 900 mg daily x 6 months (moderate risk)  TMP+SMX renally dosed equiv of 400/80 po MWF x 6 months  Needs LFT/hepB monitoring as below    Intensive toxicity monitoring for prescription antimicrobials   CBC w/diff at least once per week  CMP at least once per week  clinical assessments for rashes or other skin changes  Given hepB core+, needs LFTs q54m for first year, HBSAg and HBV DNA q46m for first year and with any rise in LFTs    Solid Organ Transplant Infectious Diseases Follow-up Instructions  Appointment: 11/20/22 at 12:00pm  Location: [facility], [address], [place], [place]  Labs: weekly CBC with differential, CMP  Please fax labs to patient???s transplant coordinator: Celene Squibb at  [phone] (Kidney/Pancreas) and to [employer] (ICHID pharmD at [phone])   Antibiotics:   Meropenem IV (renal equivalent of 1g  q8h)      Antibiotic End Date: 11/18/22    The [facility] ID APP service will sign off and arrange outpatient ID follow up.           Please page Darolyn Rua, NP at [phone] from 8-4:30pm, after 4:30 pm & on weekends, please page the ID Transplant/Liquid Oncology Fellow consult at [phone] with questions.    Patient discussed with Dr. Michiel Cowboy.      Darolyn Rua, MSN, APRN, AGNP-C  Immunocompromised Infectious Disease Nurse Practitioner    I personally spent 15 minutes face-to-face and non-face-to-face in the care of this patient, which includes all pre, intra, and post visit time on the date of service.  All documented time was specific to the E/M visit and does not include any procedures that may have been performed.      Subjective:     External record(s): Primary team note: reviewed and noted no change in immunosuppression today .    Independent historian(s): no independent historian required.       Interval History:   Afebrile and NAEON. Patient requesting staples be removed from midline. Patient denies n/v/d, trouble breathing.     Medications:  Current Medications as of 11/09/2022  Scheduled  PRN   amlodipine, 5 mg, Q12H SCH  aspirin, 81 mg, Daily  atorvastatin, 10 mg, Daily  carvedilol, 25 mg, BID  estradiol, 2 g, Nightly  heparin (porcine) for subcutaneous use, 5,000 Units, Q8H [facility]  [Provider Hold] insulin glargine, 6 Units, Nightly  insulin lispro, 0-20 Units, ACHS  levothyroxine, 112 mcg, daily  meropenem, 1 g, Q12H  mycophenolate, 250 mg, BID  pantoprazole, 40 mg, Daily  predniSONE, 5 mg, Daily  spironolactone, 25 mg, Daily  sulfamethoxazole-trimethoprim, 1 tablet, Q MWF  tacrolimus, 1 mg, Daily   And  tacrolimus, 1 mg, Nightly  valGANciclovir, 450 mg, Every other day      acetaminophen, 650 mg, Q6H PRN  aluminum-magnesium hydroxide-simethicone, 30 mL, Q4H PRN  dextrose in water, 12.5 g, Q10 Min PRN  glucagon, 1 mg, Once PRN  glucose, 16 g, Q10 Min PRN  guaiFENesin, 200 mg, Q4H  PRN  melatonin, 3 mg, Nightly PRN  ondansetron, 4 mg, Q8H PRN   Or  ondansetron, 4 mg, Q8H PRN  polyethylene glycol, 17 g, Daily PRN         Objective:     Vital Signs last 24 hours:  Temp:  [36.6 ??C (97.9 ??F)-36.9 ??C (98.4 ??F)] 36.8 ??C (98.2 ??F)  Heart Rate:  [65-68] 67  Resp:  [18-19] 18  BP: (160-187)/(47-53) 163/52  MAP (mmHg):  [83-90] 83  SpO2:  [93 %-99 %] 95 %    Physical Exam:   Patient Lines/Drains/Airways Status       Active Active Lines, Drains, & Airways       Name Placement date Placement time Site Days    Peripheral IV 11/04/22 Anterior;Right;Upper Arm 11/04/22  2159  Arm  4    Peripheral IV 11/06/22 Anterior;Right Forearm 11/06/22  1603  Forearm  2    Arteriovenous Fistula - Vein Graft  Access 08/24/16 Left;Upper Arm 08/24/16  --  Arm  2268                  Const [x]  vital signs above    []  NAD, non-toxic appearance []  Chronically ill-appearing, non-distressed  Sitting up in bed      Eyes [x]  Lids normal bilaterally, conjunctiva anicteric and noninjected OU     [] PERRL  [] EOMI        ENMT [x]  Normal appearance of external nose and ears, no nasal discharge        [x]  MMM, no lesions on lips or gums []  No thrush, leukoplakia, oral lesions  []  Dentition good []  Edentulous []  Dental caries present  []  Hearing normal  []  TMs with good light reflexes bilaterally         Neck [x]  Neck of normal appearance and trachea midline        []  No thyromegaly, nodules, or tenderness   []  Full neck ROM        Lymph []  No LAD in neck     []  No LAD in supraclavicular area     []  No LAD in axillae   []  No LAD in epitrochlear chains     []  No LAD in inguinal areas        CV []  RRR            []  No peripheral edema     []  Pedal pulses intact   []  No abnormal heart sounds appreciated   [x]  Extremities WWP   Murmur present      Resp [x]  Normal WOB at rest    []  No breathlessness with speaking, no coughing  []  CTA anteriorly    []  CTA posteriorly          GI [x]  Normal inspection, NTND   []  NABS     []  No umbilical hernia on exam       []  No hepatosplenomegaly     []  Inspection of perineal and perianal areas normal        GU []  Normal external genitalia     [x] No urinary catheter present in urethra   []  No CVA tenderness    []  No tenderness over renal allograft        MSK []  No clubbing or cyanosis of hands       [x]  No vertebral point tenderness  []  No focal tenderness or abnormalities on palpation of joints in RUE, LUE, RLE, or LLE  Skin [x]  No rashes, lesions, or ulcers of visualized skin     []  Skin warm and dry to palpation   Scab under left knee      Neuro [x]  Face expression symmetric  []  Sensation to light touch grossly intact throughout    [x]  Moves extremities equally    []  No tremor noted        []  CNs II-XII grossly intact     []  DTRs normal and symmetric throughout []  Gait unremarkable        Psych [x]  Appropriate affect       []  Fluent speech         [x]  Attentive, good eye contact  []  Oriented to person, place, time          []  Judgment and insight are appropriate           Data for Medical Decision Making     11/04/22 EKG QTcF    I discussed mgm't w/qualified health care professional(s) involved in case: discussed with primary team treatment recommendations with duration .    I reviewed CBC results (WBC continues to decrease; platelets continuing to improve), chemistry results (creatinine improved and WNL), and Phos low today .    I independently visualized/interpreted not done.       Recent Labs     Units 11/04/22  2153 11/05/22  0836 11/08/22  1027 11/09/22  1043   WBC 10*9/L 10.1   < > 3.1* 2.8*   HGB g/dL 9.4*   < > 7.4* 7.8*   PLT 10*9/L 86*   < > 56* 66*   NEUTROABS 10*9/L 9.4*   < > 2.5  --    LYMPHSABS 10*9/L 0.4*   < > 0.2*  --    EOSABS 10*9/L 0.0   < > 0.0  --    BUN mg/dL 45*   < > 24* 18   CREATININE mg/dL 8.41*   < > 3.24* 4.01   AST U/L 34 - 31  --   --   --    ALT U/L 31 - 32  --   --   --    BILITOT mg/dL 0.3 - 0.3  --   --   --    ALKPHOS U/L 89 - 90  --   --   --    K mmol/L 4.4   < > 4.0 4.1   MG mg/dL 1.3*  --   --   --    PHOS mg/dL  --    < > 2.6 - 2.7 2.0*   CALCIUM mg/dL 8.8   < > 7.9* 7.9*    < > = values in this interval not displayed.     Lab Results   Component Value Date    TACROLIMUS 6.1 11/09/2022         Microbiology:  Microbiology Results (last day)       Procedure Component Value Date/Time Date/Time    Blood Culture [[id number]]  (Normal) Collected: 11/05/22 0838    Lab Status: Preliminary result Specimen: Blood from 1 Peripheral Draw Updated: 11/09/22 0900     Blood Culture, Routine No Growth at 4 days    Blood Culture [[id number]]  (Normal) Collected: 11/07/22 2359    Lab Status: Preliminary result Specimen: Blood from 1 Peripheral Draw Updated: 11/09/22 0015     Blood Culture, Routine No Growth at 24 hours    Blood Culture [[id number]]  (Normal) Collected: 11/07/22 2359    Lab  Status: Preliminary result Specimen: Blood from 1 Peripheral Draw Updated: 11/09/22 0015     Blood Culture, Routine No Growth at 24 hours    Blood Culture, Adult [[id number]]  (Normal) Collected: 11/06/22 1617    Lab Status: Preliminary result Specimen: Blood from 1 Peripheral Draw Updated: 11/08/22 1645     Blood Culture, Routine No Growth at 48 hours    Blood Culture, Adult [[id number]]  (Normal) Collected: 11/06/22 1623    Lab Status: Preliminary result Specimen: Blood from 1 Peripheral Draw Updated: 11/08/22 1645     Blood Culture, Routine No Growth at 48 hours            Imaging:  No new imaging

## 2022-11-09 NOTE — Unmapped (Signed)
Vancomycin Therapeutic Monitoring Pharmacy Note    Julie [name] is a 72 y.o. female continuing vancomycin. Date of therapy initiation: 11/04/22.    Indication: Urinary Tract Infection (UTI) and CNS Infection    Prior Dosing Information: Current regimen vancomycin 750 mg IV q24 hours      Goals:  Therapeutic Drug Levels  Vancomycin trough goal: 15-20 mg/L for CNS infection    Additional Clinical Monitoring/Outcomes  Renal function, volume status (intake and output)    Results: Vancomycin level 13.8 mg/L, drawn early, true trough ~11 mg/L    Wt Readings from Last 1 Encounters:   11/06/22 56.7 kg (124 lb 14.3 oz)     Creatinine   Date Value Ref Range Status   11/08/2022 1.15 (H) 0.55 - 1.02 mg/dL Final   16/04/9603 5.40 (H) 0.55 - 1.02 mg/dL Final   98/05/9146 8.29 (H) 0.55 - 1.02 mg/dL Final        Pharmacokinetic Considerations and Significant Drug Interactions:  Adult (estimated initial): Vd = 40.3 L, ke = 0.031 hr-1  Concurrent nephrotoxic meds:  Acyclovir    Assessment/Plan:  Recommendation(s)  Change current regimen to vancomycin 1000 mg IV q24 hours  Estimated trough on recommended regimen:  16 mg/L    Follow-up  Level due:  prior to the third dose on a once daily regimen or sooner if there is a  significant change in renal function  A pharmacist will continue to monitor and order levels as appropriate    Please page service pharmacist with questions/clarifications.    Deanna Artis, PharmD

## 2022-11-10 LAB — RENAL FUNCTION PANEL
ALBUMIN: 2.8 g/dL — ABNORMAL LOW (ref 3.4–5.0)
ANION GAP: 10 mmol/L (ref 5–14)
BLOOD UREA NITROGEN: 18 mg/dL (ref 9–23)
BUN / CREAT RATIO: 19
CALCIUM: 8.8 mg/dL (ref 8.7–10.4)
CHLORIDE: 109 mmol/L — ABNORMAL HIGH (ref 98–107)
CO2: 22 mmol/L (ref 20.0–31.0)
CREATININE: 0.94 mg/dL
EGFR CKD-EPI (2021) FEMALE: 64 mL/min/{1.73_m2} (ref >=60)
GLUCOSE RANDOM: 146 mg/dL (ref 70–179)
PHOSPHORUS: 1.9 mg/dL — ABNORMAL LOW (ref 2.4–5.1)
POTASSIUM: 4.1 mmol/L (ref 3.4–4.8)
SODIUM: 141 mmol/L (ref 135–145)

## 2022-11-10 LAB — CBC
HEMATOCRIT: 22.1 % — ABNORMAL LOW (ref 34.0–44.0)
HEMOGLOBIN: 7.5 g/dL — ABNORMAL LOW (ref 11.3–14.9)
MEAN CORPUSCULAR HEMOGLOBIN CONC: 34 g/dL (ref 32.0–36.0)
MEAN CORPUSCULAR HEMOGLOBIN: 31.9 pg (ref 25.9–32.4)
MEAN CORPUSCULAR VOLUME: 93.8 fL (ref 77.6–95.7)
MEAN PLATELET VOLUME: 8.6 fL (ref 6.8–10.7)
PLATELET COUNT: 91 10*9/L — ABNORMAL LOW (ref 150–450)
RED BLOOD CELL COUNT: 2.35 10*12/L — ABNORMAL LOW (ref 3.95–5.13)
RED CELL DISTRIBUTION WIDTH: 15 % (ref 12.2–15.2)
WBC ADJUSTED: 3.3 10*9/L — ABNORMAL LOW (ref 3.6–11.2)

## 2022-11-10 LAB — TACROLIMUS LEVEL, TROUGH: TACROLIMUS, TROUGH: 7.5 ng/mL (ref 5.0–15.0)

## 2022-11-10 MED ADMIN — potassium & sodium phosphates 250mg (PHOS-NAK/NEUTRA PHOS) packet 2 packet: 2 | ORAL | @ 16:00:00 | Stop: 2022-11-10

## 2022-11-10 MED ADMIN — insulin glargine (LANTUS) injection 4 Units: 4 [IU] | SUBCUTANEOUS | @ 01:00:00

## 2022-11-10 MED ADMIN — estradiol (ESTRACE) 0.01 % (0.1 mg/gram) vaginal cream 2 g: 2 g | VAGINAL | @ 01:00:00 | Stop: 2022-11-19

## 2022-11-10 MED ADMIN — carvedilol (COREG) tablet 25 mg: 25 mg | ORAL | @ 01:00:00

## 2022-11-10 MED ADMIN — heparin (porcine) 5,000 unit/mL injection 5,000 Units: 5000 [IU] | SUBCUTANEOUS | @ 18:00:00

## 2022-11-10 MED ADMIN — tacrolimus (PROGRAF) capsule 1 mg: 1 mg | ORAL | @ 01:00:00

## 2022-11-10 MED ADMIN — tacrolimus (PROGRAF) capsule 1 mg: 1 mg | ORAL | @ 15:00:00

## 2022-11-10 MED ADMIN — atorvastatin (LIPITOR) tablet 10 mg: 10 mg | ORAL | @ 13:00:00

## 2022-11-10 MED ADMIN — sulfamethoxazole-trimethoprim (BACTRIM) 400-80 mg tablet 80 mg of trimethoprim: 1 | ORAL | @ 13:00:00 | Stop: 2023-11-02

## 2022-11-10 MED ADMIN — mycophenolate (CELLCEPT) capsule 250 mg: 250 mg | ORAL | @ 01:00:00

## 2022-11-10 MED ADMIN — amlodipine (NORVASC) tablet 5 mg: 5 mg | ORAL | @ 13:00:00

## 2022-11-10 MED ADMIN — aspirin chewable tablet 81 mg: 81 mg | ORAL | @ 13:00:00

## 2022-11-10 MED ADMIN — heparin (porcine) 5,000 unit/mL injection 5,000 Units: 5000 [IU] | SUBCUTANEOUS | @ 02:00:00

## 2022-11-10 MED ADMIN — predniSONE (DELTASONE) tablet 5 mg: 5 mg | ORAL | @ 13:00:00

## 2022-11-10 MED ADMIN — insulin lispro (HumaLOG) injection 0-20 Units: 0-20 [IU] | SUBCUTANEOUS | @ 23:00:00

## 2022-11-10 MED ADMIN — pantoprazole (Protonix) EC tablet 40 mg: 40 mg | ORAL | @ 13:00:00

## 2022-11-10 MED ADMIN — amlodipine (NORVASC) tablet 5 mg: 5 mg | ORAL | @ 01:00:00

## 2022-11-10 MED ADMIN — levothyroxine (SYNTHROID) tablet 112 mcg: 112 ug | ORAL | @ 09:00:00

## 2022-11-10 MED ADMIN — mycophenolate (CELLCEPT) capsule 500 mg: 500 mg | ORAL | @ 13:00:00

## 2022-11-10 MED ADMIN — carvedilol (COREG) tablet 25 mg: 25 mg | ORAL | @ 13:00:00

## 2022-11-10 MED ADMIN — heparin (porcine) 5,000 unit/mL injection 5,000 Units: 5000 [IU] | SUBCUTANEOUS | @ 09:00:00

## 2022-11-10 MED ADMIN — spironolactone (ALDACTONE) tablet 25 mg: 25 mg | ORAL | @ 13:00:00

## 2022-11-10 MED ADMIN — valGANciclovir (VALCYTE) tablet 450 mg: 450 mg | ORAL | @ 13:00:00

## 2022-11-10 MED ADMIN — insulin lispro (HumaLOG) injection 0-20 Units: 0-20 [IU] | SUBCUTANEOUS | @ 03:00:00

## 2022-11-10 MED ADMIN — meropenem (MERREM) 1 g in sodium chloride 0.9 % (NS) 100 mL IVPB-MBP: 1 g | INTRAVENOUS | @ 01:00:00 | Stop: 2022-11-18

## 2022-11-10 MED ADMIN — meropenem (MERREM) 1 g in sodium chloride 0.9 % (NS) 100 mL IVPB-MBP: 1 g | INTRAVENOUS | @ 15:00:00 | Stop: 2022-11-18

## 2022-11-10 MED ADMIN — insulin lispro (HumaLOG) injection 0-20 Units: 0-20 [IU] | SUBCUTANEOUS | @ 17:00:00

## 2022-11-10 NOTE — Unmapped (Signed)
Pt free of falls. Bp elevated. Cardiac meds provided. BP elevated mid day as well. Pt asymptomatic. Md notified. IV abx provided. Tele removed. Bedside teaching provided by infusion nurse and reinforced. Up to bathroom with one assist. Pt denies pain. Appetite improved. And incision site clean dry and intact with no bleeding.   Problem: Adult Inpatient Plan of Care  Goal: Plan of Care Review  Outcome: Progressing  Goal: Patient-Specific Goal (Individualized)  Outcome: Progressing  Goal: Absence of Hospital-Acquired Illness or Injury  Outcome: Progressing  Intervention: Identify and Manage Fall Risk  Recent Flowsheet Documentation  Taken 11/10/2022 0800 by Janett Labella, RN  Safety Interventions:   low bed   fall reduction program maintained   nonskid shoes/slippers when out of bed  Taken 11/10/2022 0731 by Janett Labella, RN  Safety Interventions:   low bed   fall reduction program maintained   nonskid shoes/slippers when out of bed  Intervention: Prevent and Manage VTE (Venous Thromboembolism) Risk  Recent Flowsheet Documentation  Taken 11/10/2022 0900 by Janett Labella, RN  VTE Prevention/Management: anticoagulant therapy  Intervention: Prevent Infection  Recent Flowsheet Documentation  Taken 11/10/2022 0800 by Janett Labella, RN  Infection Prevention: single patient room provided  Goal: Optimal Comfort and Wellbeing  Outcome: Progressing  Goal: Readiness for Transition of Care  Outcome: Progressing  Goal: Rounds/Family Conference  Outcome: Progressing     Problem: Fall Injury Risk  Goal: Absence of Fall and Fall-Related Injury  Outcome: Progressing  Intervention: Identify and Manage Contributors  Recent Flowsheet Documentation  Taken 11/10/2022 0900 by Janett Labella, RN  Self-Care Promotion: BADL personal objects within reach  Intervention: Promote Injury-Free Environment  Recent Flowsheet Documentation  Taken 11/10/2022 0800 by Janett Labella, RN  Safety Interventions:   low bed   fall reduction program maintained   nonskid shoes/slippers when out of bed  Taken 11/10/2022 0731 by Janett Labella, RN  Safety Interventions:   low bed   fall reduction program maintained   nonskid shoes/slippers when out of bed     Problem: Self-Care Deficit  Goal: Improved Ability to Complete Activities of Daily Living  Outcome: Progressing  Intervention: Promote Activity and Functional Independence  Recent Flowsheet Documentation  Taken 11/10/2022 0900 by Janett Labella, RN  Self-Care Promotion: BADL personal objects within reach     Problem: Infection  Goal: Absence of Infection Signs and Symptoms  Outcome: Progressing  Intervention: Prevent or Manage Infection  Recent Flowsheet Documentation  Taken 11/10/2022 0800 by Janett Labella, RN  Infection Management: aseptic technique maintained     Problem: Wound  Goal: Optimal Coping  Outcome: Progressing  Goal: Optimal Functional Ability  Outcome: Progressing  Goal: Absence of Infection Signs and Symptoms  Outcome: Progressing  Intervention: Prevent or Manage Infection  Recent Flowsheet Documentation  Taken 11/10/2022 0800 by Janett Labella, RN  Infection Management: aseptic technique maintained  Goal: Improved Oral Intake  Outcome: Progressing  Goal: Optimal Pain Control and Function  Outcome: Progressing  Goal: Skin Health and Integrity  Outcome: Progressing  Goal: Optimal Wound Healing  Outcome: Progressing     Problem: Comorbidity Management  Goal: Blood Glucose Levels Within Targeted Range  Outcome: Progressing  Goal: Blood Pressure in Desired Range  Outcome: Progressing

## 2022-11-10 NOTE — Unmapped (Signed)
Tacrolimus Therapeutic Monitoring Pharmacy Note    Breezy Daignault is a 72 y.o. female continuing tacrolimus.     Indication: Kidney transplant     Date of Transplant:  08/06/22       Prior Dosing Information: Current regimen: tacrolimus 1 mg PO QAM + 1 mg PO QPM (dose increased starting 4/15 PM)    Goals:  Therapeutic Drug Levels  Tacrolimus trough goal: 6-8 ng/mL (goal reduced during last hospitalization)    Additional Clinical Monitoring/Outcomes  Monitor renal function (SCr and urine output) and liver function (LFTs)  Monitor for signs/symptoms of adverse events (e.g., hyperglycemia, hyperkalemia, hypomagnesemia, hypertension, headache, tremor)    Results:   Tacrolimus level:  7.5 ng/mL, drawn appropriately    Pharmacokinetic Considerations and Significant Drug Interactions:  Concurrent hepatotoxic medications:  Bactrim  Concurrent CYP3A4 substrates/inhibitors: None identified  Concurrent nephrotoxic medications:  Bactrim, IV Acyclovir (discontinued 4/16 AM)    Assessment/Plan:  Recommendedation(s)  Continue current regimen of tacrolimus 1 mg PO BID.      Follow-up  Daily levels at 0600 have been ordered . If levels are within goal range again on 4/20, would decrease frequency of monitoring to 3 times per week.  A pharmacist will continue to monitor and recommend levels as appropriate    Please page service pharmacist with questions/clarifications.    Christene Lye, PharmD

## 2022-11-10 NOTE — Unmapped (Signed)
Patient A&Ox3, VSS. Patient denied shortness of breath, nausea and vomiting. Blood glucose controlled per order. IV abx given. Husband at bedside. Patient was free from falls, call bell and bedside table within reach.   Problem: Adult Inpatient Plan of Care  Goal: Plan of Care Review  Outcome: Ongoing - Unchanged  Goal: Patient-Specific Goal (Individualized)  Outcome: Ongoing - Unchanged  Goal: Absence of Hospital-Acquired Illness or Injury  Outcome: Ongoing - Unchanged  Intervention: Prevent and Manage VTE (Venous Thromboembolism) Risk  Recent Flowsheet Documentation  Taken 11/09/2022 2042 by Ananias Pilgrim, RN  VTE Prevention/Management: anticoagulant therapy  Goal: Optimal Comfort and Wellbeing  Outcome: Ongoing - Unchanged  Goal: Readiness for Transition of Care  Outcome: Ongoing - Unchanged  Goal: Rounds/Family Conference  Outcome: Ongoing - Unchanged     Problem: Fall Injury Risk  Goal: Absence of Fall and Fall-Related Injury  Outcome: Ongoing - Unchanged     Problem: Self-Care Deficit  Goal: Improved Ability to Complete Activities of Daily Living  Outcome: Ongoing - Unchanged     Problem: Infection  Goal: Absence of Infection Signs and Symptoms  Outcome: Ongoing - Unchanged     Problem: Wound  Goal: Optimal Coping  Outcome: Ongoing - Unchanged  Goal: Optimal Functional Ability  Outcome: Ongoing - Unchanged  Goal: Absence of Infection Signs and Symptoms  Outcome: Ongoing - Unchanged  Goal: Improved Oral Intake  Outcome: Ongoing - Unchanged  Goal: Optimal Pain Control and Function  Outcome: Ongoing - Unchanged  Goal: Skin Health and Integrity  Outcome: Ongoing - Unchanged  Goal: Optimal Wound Healing  Outcome: Ongoing - Unchanged     Problem: Comorbidity Management  Goal: Blood Glucose Levels Within Targeted Range  Outcome: Ongoing - Unchanged  Goal: Blood Pressure in Desired Range  Outcome: Ongoing - Unchanged

## 2022-11-10 NOTE — Unmapped (Signed)
VASCULAR INTERVENTIONAL RADIOLOGY INPATIENT CVC CONSULTATION     Requesting Attending Physician: Neysa Hotter, MD  Service Requesting Consult: Nephrology (MDB)    Date of Service: 11/10/2022  Consulting Interventional Radiologist: Dr. Claretta Fraise     HPI:     Reason for consult: tunneled line for 14 days of antibiotics     History of Present Illness:   Julie [name] is a 72 y.o. 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      She is re-admitted for N/V and lethargy found to have UTI and GNR bacteremia. She has a history of tunneled HD catheter.     Review of Systems:  Pertinent items are noted in HPI.    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)     since July 2017    Hypertension     Hypothyroidism (acquired)     Kidney transplant status, cadaveric 08/08/2022    Type 2 diabetes mellitus (CMS-HCC)        Surgical History:  Past Surgical History:   Procedure Laterality Date    CATARACT EXTRACTION      HYSTERECTOMY      OOPHORECTOMY      PERITONEAL CATHETER INSERTION      PR LAP INSERTION TUNNELED INTRAPERITONEAL CATHETER N/A 09/27/2018    Procedure: LAPAROSCOPY, SURGICAL; WITH INSERTION OF INTRAPERITONEAL CANNULA OR CATHETER, PERMANENT;  Surgeon: Leona Carry, MD;  Location: MAIN OR [place];  Service: Transplant    PR LAP INSERTION TUNNELED INTRAPERITONEAL CATHETER N/A 04/08/2019    Procedure: LAPAROSCOPY, SURGICAL; WITH INSERTION OF INTRAPERITONEAL CANNULA OR CATHETER, PERMANENT;  Surgeon: Leona Carry, MD;  Location: MAIN OR [place];  Service: Transplant    PR LAP REVISE INTRAPERITONEAL CATHETER N/A 08/26/2019    Procedure: LAPAROSCOPY, SURGICAL; W/REVIS PREV PLACED INTRAPERITONEAL CANNULA/CATH, REMOV INTRALUMIN OBSTRUCT MATERIAL;  Surgeon: Leona Carry, MD;  Location: MAIN OR [facility];  Service: Transplant    PR REDUCE VOLVULUS,INTUSS,INTERN HERNIA N/A 10/13/2022    Procedure: REDUCTION OF VOLVULUS, INTUSSUSCEPTION, INTERNAL HERNIA, BY LAPAROTOMY;  Surgeon: Suella Broad, MD;  Location: MAIN OR [place];  Service: Trauma    PR REMOVAL TUNNELED INTRAPERITONEAL CATHETER N/A 05/06/2018    Procedure: REMOVAL OF PERMANENT INTRAPERITONEAL CANNULA OR CATHETER;  Surgeon: Leona Carry, MD;  Location: MAIN OR [facility];  Service: Transplant    PR REMOVE PERITONEAL FOREIGN BODY N/A 04/08/2019    Procedure: Removal Of Peritoneal Of Foreign Body From Peritoneal Cavity;  Surgeon: Leona Carry, MD;  Location: MAIN OR [facility];  Service: Transplant    PR TRANSPLANT,PREP RENAL GRAFT/ARTERIAL Right 08/06/2022    Procedure: [facility] RECONSTRUCTION CADAVER/LIVING DONOR RENAL ALLOGRAFT PRIOR TO TRANSPLANT; ARTERIAL ANASTOMOSIS EAC;  Surgeon: Toledo, Lilyan Punt, MD;  Location: MAIN OR [facility];  Service: Transplant    PR TRANSPLANTATION OF KIDNEY Right 08/06/2022    Procedure: RENAL ALLOTRANSPLANTATION, IMPLANTATION OF GRAFT; WITHOUT RECIPIENT NEPHRECTOMY;  Surgeon: Toledo, Lilyan Punt, MD;  Location: MAIN OR [facility];  Service: Transplant    TUBAL LIGATION         Family History:  Family History   Problem Relation Age of Onset    Diabetes Mother  Alzheimer's disease Father     Diabetes Sister     Diabetes Sister     Diabetes Sister     Anesthesia problems Neg Hx     Bleeding Disorder Neg Hx        Medications:   Current Facility-Administered Medications   Medication Dose Route Frequency Provider Last Rate Last Admin    acetaminophen (TYLENOL) tablet 650 mg  650 mg Oral Q6H PRN Wyline Beady, MD   650 mg at 11/07/22 2308    aluminum-magnesium hydroxide-simethicone (MAALOX MAX) 80-80-8 mg/mL oral suspension  30 mL Oral Q4H PRN Wyline Beady, MD        amlodipine (NORVASC) tablet 5 mg  5 mg Oral Q12H [facility] Boccaccio, Dominic E, MD   5 mg at 11/10/22 0843    aspirin chewable tablet 81 mg  81 mg Oral Daily Wyline Beady, MD   81 mg at 11/10/22 0841    atorvastatin (LIPITOR) tablet 10 mg  10 mg Oral Daily Wyline Beady, MD   10 mg at 11/10/22 0842    carvedilol (COREG) tablet 25 mg  25 mg Oral BID Corky Mull, MD   25 mg at 11/10/22 0844    dextrose (D10W) 10% bolus 125 mL  12.5 g Intravenous Q10 Min PRN Wyline Beady, MD        estradiol (ESTRACE) 0.01 % (0.1 mg/gram) vaginal cream 2 g  2 g Vaginal Nightly Blohm, Cecille Amsterdam, MD   2 g at 11/09/22 2038    glucagon injection 1 mg  1 mg Intramuscular Once PRN Wyline Beady, MD        glucose chewable tablet 16 g  16 g Oral Q10 Min PRN Wyline Beady, MD        guaiFENesin (ROBITUSSIN) oral syrup  200 mg Oral Q4H PRN Wyline Beady, MD        heparin (porcine) 5,000 unit/mL injection 5,000 Units  5,000 Units Subcutaneous Q8H [facility] Blohm, Cecille Amsterdam, MD   5,000 Units at 11/10/22 0504    insulin glargine (LANTUS) injection 4 Units  4 Units Subcutaneous Nightly Boccaccio, Dominic E, MD   4 Units at 11/09/22 2036    insulin lispro (HumaLOG) injection 0-20 Units  0-20 Units Subcutaneous ACHS Wyline Beady, MD   1 Units at 11/09/22 2250    levothyroxine (SYNTHROID) tablet 112 mcg  112 mcg Oral daily Wyline Beady, MD   112 mcg at 11/10/22 0502    melatonin tablet 3 mg  3 mg Oral Nightly PRN Wyline Beady, MD        meropenem (MERREM) 1 g in sodium chloride 0.9 % (NS) 100 mL IVPB-MBP  1 g Intravenous Q12H Blohm, Cecille Amsterdam, MD   Stopped at 11/09/22 2134    mycophenolate (CELLCEPT) capsule 500 mg  500 mg Oral Daily Boccaccio, Dominic E, MD   500 mg at 11/10/22 0845    And    mycophenolate (CELLCEPT) capsule 250 mg  250 mg Oral Nightly Boccaccio, Dominic E, MD   250 mg at 11/09/22 2034    ondansetron (ZOFRAN-ODT) disintegrating tablet 4 mg  4 mg Oral Q8H PRN Wyline Beady, MD        Or    ondansetron [facility]) injection 4 mg  4 mg Intravenous Q8H PRN Wyline Beady, MD   4 mg at 11/06/22 2217    pantoprazole (Protonix) EC tablet 40 mg  40 mg Oral Daily Wyline Beady,  MD   40 mg at 11/10/22 0842    polyethylene glycol (MIRALAX) packet 17 g  17 g Oral Daily PRN Wyline Beady, MD        predniSONE (DELTASONE) tablet 5 mg  5 mg Oral Daily Boccaccio, Dominic E, MD   5 mg at 11/10/22 0844    spironolactone (ALDACTONE) tablet 25 mg  25 mg Oral Daily Blohm, Cecille Amsterdam, MD   25 mg at 11/10/22 0844    sulfamethoxazole-trimethoprim (BACTRIM) 400-80 mg tablet 80 mg of trimethoprim  1 tablet Oral Q MWF Blohm, Cecille Amsterdam, MD   80 mg of trimethoprim at 11/10/22 0842    tacrolimus (PROGRAF) capsule 1 mg  1 mg Oral Daily Blohm, Cecille Amsterdam, MD   1 mg at 11/09/22 1100    And    tacrolimus (PROGRAF) capsule 1 mg  1 mg Oral Nightly Blohm, Cecille Amsterdam, MD   1 mg at 11/09/22 2033    valGANciclovir (VALCYTE) tablet 450 mg  450 mg Oral Daily Boccaccio, Dominic E, MD   450 mg at 11/10/22 0845       Allergies:  Oxycodone, Gabapentin, and Valacyclovir    Social History:  Social History     Tobacco Use    Smoking status: Former     Current packs/day: 0.00     Average packs/day: 0.8 packs/day for 30.0 years (22.5 ttl pk-yrs)     Types: Cigarettes     Start date: 06/06/1976     Quit date: 06/06/2006     Years since quitting: 16.4    Smokeless tobacco: Former   Substance Use Topics    Alcohol use: Yes     Comment: only special occasions    Drug use: No       Objective:      Vital Signs:  Temp:  [36.7 ??C (98.1 ??F)-36.9 ??C (98.4 ??F)] 36.7 ??C (98.1 ??F)  Heart Rate:  [63-68] 66  Resp:  [18-19] 19  BP: (150-185)/(39-52) 181/48  MAP (mmHg):  [73-87] 87  SpO2:  [93 %-100 %] 96 %    Physical Exam:      Vitals:    11/10/22 0843   BP: 181/48   Pulse: 66   Resp:    Temp:    SpO2:      ASA Grade: ASA 3 - Patient with moderate systemic disease with functional limitations  General: No apparent distress.  Lungs: Breathing even and non labored  Neuro: No obvious focal deficits.  Airway assessment: Class 3 - Can visualize soft palate    Diagnostic Studies:  None Relavent    Labs:    Recent Labs     11/08/22  1027 11/09/22  1043   WBC 3.1* 2.8*   HGB 7.4* 7.8*   HCT 21.5* 22.9*   PLT 56* 66*     Recent Labs     11/08/22  1027 11/09/22  1043   NA 139 138   K 4.0 4.1   CL 109* 111*   BUN 24* 18   CREATININE 1.15* 0.94   GLU 190* 219*     Recent Labs     11/08/22  1027 11/09/22  1043   ALBUMIN 2.4* 2.4*     Recent Labs     11/08/22  1027   INR 0.89   APTT 26.0   FIBRINOGEN 570*       Blood Cultures Pending:  No.    Assessment and Recommendations:     Ms. Potthast is a  73 y.o. 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      She is re-admitted for N/V and lethargy found to have UTI and GNR bacteremia. She has a history of tunneled HD catheter.     There is discussion of plans involving 14 more days of meropenem Q8H.  She may be able to discharge home/to daughters house or she may remain in the hospital.  Likely, she should have a line either way.      Recommendations:  - Proceed with placement of Power Line - single lumen  - Anticipated procedure date: Monday at the earliest pending discharge plan.  - Anticoagulant medication hold required: No.  - Please make NPO night prior to procedure  - Antibiotic required: No.      Informed Consent: Patient did not want an interpretor, husband at bedside  This procedure and sedation has been fully reviewed with the patient/patient???s authorized representative. The risks, benefits and alternatives including but not limited to infection, bleeding, damage to adjacent structures have been explained, and the patient/patient???s authorized representative has consented to the procedure.  --The patient will accept blood products in an emergent situation.  --The patient does not have a Do Not Resuscitate order in effect.    Consent obtained by Alease Frame, NP, witnessed, and scanned into the medical record.       Thank you for involving [place] in the care of this patient. Please page the VIR consult pager [phone]) with further questions, concerns, or if new issues arise.

## 2022-11-10 NOTE — Unmapped (Signed)
Pt free of falls. VSS. Cardiac  Problem: Wound  Goal: Optimal Coping  Outcome: Progressing  Goal: Optimal Functional Ability  Outcome: Progressing  Intervention: Optimize Functional Ability  Recent Flowsheet Documentation  Taken 11/09/2022 1200 by Janett Labella, RN  Activity Management: up to bedside commode  Goal: Absence of Infection Signs and Symptoms  Outcome: Progressing  Intervention: Prevent or Manage Infection  Recent Flowsheet Documentation  Taken 11/09/2022 0713 by Janett Labella, RN  Infection Management: aseptic technique maintained  Goal: Improved Oral Intake  Outcome: Progressing  Goal: Optimal Pain Control and Function  Outcome: Progressing  Goal: Skin Health and Integrity  Outcome: Progressing  Intervention: Optimize Skin Protection  Recent Flowsheet Documentation  Taken 11/09/2022 1200 by Janett Labella, RN  Activity Management: up to bedside commode  Taken 11/09/2022 0713 by Janett Labella, RN  Pressure Reduction Techniques: frequent weight shift encouraged  Pressure Reduction Devices: positioning supports utilized  Skin Protection: adhesive use limited  Goal: Optimal Wound Healing  Outcome: Progressing     Problem: Comorbidity Management  Goal: Blood Glucose Levels Within Targeted Range  Outcome: Progressing  Intervention: Monitor and Manage Glycemia  Recent Flowsheet Documentation  Taken 11/09/2022 0713 by Janett Labella, RN  Glycemic Management: blood glucose monitored  Goal: Blood Pressure in Desired Range  Outcome: Progressing     Problem: Infection  Goal: Absence of Infection Signs and Symptoms  Outcome: Progressing  Intervention: Prevent or Manage Infection  Recent Flowsheet Documentation  Taken 11/09/2022 0713 by Janett Labella, RN  Infection Management: aseptic technique maintained    and renal meds provided. BG monitored and insulin provided. IV abx provided. Up to bedside commode with assistance. Staples removed by MD at bedside. Pt denies pain. Family updated. Appetite improved.

## 2022-11-11 LAB — RENAL FUNCTION PANEL
ALBUMIN: 2.9 g/dL — ABNORMAL LOW (ref 3.4–5.0)
ANION GAP: 8 mmol/L (ref 5–14)
BLOOD UREA NITROGEN: 17 mg/dL (ref 9–23)
BUN / CREAT RATIO: 18
CALCIUM: 8.9 mg/dL (ref 8.7–10.4)
CHLORIDE: 108 mmol/L — ABNORMAL HIGH (ref 98–107)
CO2: 24 mmol/L (ref 20.0–31.0)
CREATININE: 0.92 mg/dL
EGFR CKD-EPI (2021) FEMALE: 66 mL/min/{1.73_m2} (ref >=60)
GLUCOSE RANDOM: 140 mg/dL (ref 70–179)
PHOSPHORUS: 2.7 mg/dL (ref 2.4–5.1)
POTASSIUM: 4.3 mmol/L (ref 3.4–4.8)
SODIUM: 140 mmol/L (ref 135–145)

## 2022-11-11 LAB — CBC
HEMATOCRIT: 22.8 % — ABNORMAL LOW (ref 34.0–44.0)
HEMOGLOBIN: 7.9 g/dL — ABNORMAL LOW (ref 11.3–14.9)
MEAN CORPUSCULAR HEMOGLOBIN CONC: 34.6 g/dL (ref 32.0–36.0)
MEAN CORPUSCULAR HEMOGLOBIN: 32.6 pg — ABNORMAL HIGH (ref 25.9–32.4)
MEAN CORPUSCULAR VOLUME: 94.1 fL (ref 77.6–95.7)
MEAN PLATELET VOLUME: 8.5 fL (ref 6.8–10.7)
PLATELET COUNT: 137 10*9/L — ABNORMAL LOW (ref 150–450)
RED BLOOD CELL COUNT: 2.43 10*12/L — ABNORMAL LOW (ref 3.95–5.13)
RED CELL DISTRIBUTION WIDTH: 14.7 % (ref 12.2–15.2)
WBC ADJUSTED: 3.2 10*9/L — ABNORMAL LOW (ref 3.6–11.2)

## 2022-11-11 LAB — TACROLIMUS LEVEL, TROUGH: TACROLIMUS, TROUGH: 8.3 ng/mL (ref 5.0–15.0)

## 2022-11-11 MED ADMIN — mycophenolate (CELLCEPT) capsule 250 mg: 250 mg | ORAL | @ 01:00:00

## 2022-11-11 MED ADMIN — carvedilol (COREG) tablet 25 mg: 25 mg | ORAL | @ 01:00:00

## 2022-11-11 MED ADMIN — amlodipine (NORVASC) tablet 5 mg: 5 mg | ORAL | @ 13:00:00

## 2022-11-11 MED ADMIN — atorvastatin (LIPITOR) tablet 10 mg: 10 mg | ORAL | @ 13:00:00

## 2022-11-11 MED ADMIN — mycophenolate (CELLCEPT) capsule 500 mg: 500 mg | ORAL | @ 13:00:00 | Stop: 2022-11-11

## 2022-11-11 MED ADMIN — heparin (porcine) 5,000 unit/mL injection 5,000 Units: 5000 [IU] | SUBCUTANEOUS | @ 10:00:00

## 2022-11-11 MED ADMIN — chlorthalidone (HYGROTON) tablet 25 mg: 25 mg | ORAL | @ 17:00:00

## 2022-11-11 MED ADMIN — estradiol (ESTRACE) 0.01 % (0.1 mg/gram) vaginal cream 2 g: 2 g | VAGINAL | @ 01:00:00 | Stop: 2022-11-19

## 2022-11-11 MED ADMIN — insulin lispro (HumaLOG) injection 0-20 Units: 0-20 [IU] | SUBCUTANEOUS | @ 17:00:00

## 2022-11-11 MED ADMIN — predniSONE (DELTASONE) tablet 5 mg: 5 mg | ORAL | @ 13:00:00

## 2022-11-11 MED ADMIN — insulin lispro (HumaLOG) injection 0-20 Units: 0-20 [IU] | SUBCUTANEOUS | @ 05:00:00

## 2022-11-11 MED ADMIN — pantoprazole (Protonix) EC tablet 40 mg: 40 mg | ORAL | @ 13:00:00

## 2022-11-11 MED ADMIN — heparin (porcine) 5,000 unit/mL injection 5,000 Units: 5000 [IU] | SUBCUTANEOUS | @ 02:00:00

## 2022-11-11 MED ADMIN — valGANciclovir (VALCYTE) tablet 450 mg: 450 mg | ORAL | @ 13:00:00

## 2022-11-11 MED ADMIN — carvedilol (COREG) tablet 25 mg: 25 mg | ORAL | @ 13:00:00

## 2022-11-11 MED ADMIN — tacrolimus (PROGRAF) capsule 1 mg: 1 mg | ORAL | @ 13:00:00

## 2022-11-11 MED ADMIN — potassium & sodium phosphates 250mg (PHOS-NAK/NEUTRA PHOS) packet 2 packet: 2 | ORAL | @ 01:00:00 | Stop: 2022-11-10

## 2022-11-11 MED ADMIN — tacrolimus (PROGRAF) capsule 1 mg: 1 mg | ORAL | @ 01:00:00

## 2022-11-11 MED ADMIN — aspirin chewable tablet 81 mg: 81 mg | ORAL | @ 13:00:00

## 2022-11-11 MED ADMIN — insulin glargine (LANTUS) injection 4 Units: 4 [IU] | SUBCUTANEOUS | @ 01:00:00

## 2022-11-11 MED ADMIN — amlodipine (NORVASC) tablet 5 mg: 5 mg | ORAL | @ 01:00:00

## 2022-11-11 MED ADMIN — meropenem (MERREM) 1 g in sodium chloride 0.9 % (NS) 100 mL IVPB-MBP: 1 g | INTRAVENOUS | @ 13:00:00 | Stop: 2022-11-18

## 2022-11-11 MED ADMIN — spironolactone (ALDACTONE) tablet 25 mg: 25 mg | ORAL | @ 13:00:00

## 2022-11-11 MED ADMIN — levothyroxine (SYNTHROID) tablet 112 mcg: 112 ug | ORAL | @ 10:00:00

## 2022-11-11 MED ADMIN — heparin (porcine) 5,000 unit/mL injection 5,000 Units: 5000 [IU] | SUBCUTANEOUS | @ 18:00:00

## 2022-11-11 MED ADMIN — insulin lispro (HumaLOG) injection 0-20 Units: 0-20 [IU] | SUBCUTANEOUS | @ 21:00:00

## 2022-11-11 MED ADMIN — meropenem (MERREM) 1 g in sodium chloride 0.9 % (NS) 100 mL IVPB-MBP: 1 g | INTRAVENOUS | @ 01:00:00 | Stop: 2022-11-18

## 2022-11-11 NOTE — Unmapped (Signed)
Tacrolimus Therapeutic Monitoring Pharmacy Note    Julie [name] is a 72 y.o. female continuing tacrolimus.     Indication: Kidney transplant     Date of Transplant:  08/06/22       Prior Dosing Information: Current regimen: tacrolimus 1 mg PO QAM + 1 mg PO QPM (dose increased starting 4/15 PM)    Goals:  Therapeutic Drug Levels  Tacrolimus trough goal: 6-8 ng/mL (goal reduced during last hospitalization)    Additional Clinical Monitoring/Outcomes  Monitor renal function (SCr and urine output) and liver function (LFTs)  Monitor for signs/symptoms of adverse events (e.g., hyperglycemia, hyperkalemia, hypomagnesemia, hypertension, headache, tremor)    Results:   Tacrolimus level:  7.5 ng/mL, drawn appropriately    Pharmacokinetic Considerations and Significant Drug Interactions:  Concurrent hepatotoxic medications:  Bactrim  Concurrent CYP3A4 substrates/inhibitors: None identified  Concurrent nephrotoxic medications:  Bactrim, IV Acyclovir (discontinued 4/16 AM)    Assessment/Plan:  4/20 tacro level = 8.3 ng/mL at 0706 (slight above goal)  4/20 - scr "stable"     Recommendedation(s)  Continue current regimen of tacrolimus 1 mg PO BID.    Will use 4/21 level to assess / adjust dose as needed    Follow-up  Daily levels at 0600 have been ordered .   A pharmacist will continue to monitor and recommend levels as appropriate    Please page service pharmacist with questions/clarifications.    Drue Flirt, [facility]

## 2022-11-11 NOTE — Unmapped (Signed)
11/11/22 @11 :20 am   Assessed RUA PIV.  Fluses well without pain , swelling, or redness.  Bruising noted around IV Site, appears maybe from insertion. Pt denies any c/o discomfort when flushed.  Instructed pt to inform Care Nurse of any discomort while IV infusing.  Suggested to Care Nurse that PIV could use a dressing change. Pt tolerated procedure well.

## 2022-11-11 NOTE — Unmapped (Signed)
Nephrology (MEDB) Progress Note    Assessment & Plan:   Julie [name] is a 72 y.o. 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 She returns for nausea, vomiting, and lethargy found to have UTI and GNR bacteremia.     Principal Problem:    UTI (urinary tract infection)  Active Problems:    Type 2 diabetes mellitus with diabetic nephropathy, with long-term current use of insulin (CMS-HCC)    End-stage renal disease (CMS-HCC)    Hypothyroidism, acquired    Kidney transplant recipient    Anemia due to stage 5 chronic kidney disease, not on chronic dialysis (CMS-HCC)    Delirium    Immunosuppressed status (CMS-HCC)  Resolved Problems:    * No resolved hospital problems. *    ESBL E. Coli Bacteremia 2/2 urinary source - Hx MDR UTIs - Transplanted kidney  Pt found to have UTI on urinalysis and GNR bacteremia on blood cx. She was recently admitted 3/14 - 4/3 for GNR bacteremia and finished course of meropenam (3/15 - 4/3) prior to discharge. Initial blood culture and urine cx positive for ESBL E coli, repeat cultures negative. No further fevers. Ongoing discussions with case management, patient can be home health candidate at this time.  - Meropenem 1g q12 (4/14-4/27)  - ICID following, appreciate recommendations  - VIR consult for tunneled line placement, anticipate placement on 4/22  - PRN tylenol 1g q8  - Referred to urogyn on recent discharge given recurrent UTI, appt has not been scheduled yet  - continue estrogen topical for UTI ppx    Delirium (resolved) - Acute Toxic Metabolic Encephalopathy  Patient's mental status now back at baseline, A&O x4 asking when she can leave the hospital.  She was responding appropriately and alert and oriented. Patient has had hypoactive and hyperactive delirium. Initially concerned for encephalitis/meningitis given prior fevers and mental status but given improvement in mental status and resolution of fevers this is now a low concern. VZV pcr negative and crypto antigen negative.   - Treat infection as above     AKI - S/p DDKT 08/06/22   Follows with Dr. Elvera Maria. 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 Renal US 4/14 found decreased size of perinephric collection around transplanted kidney as compared to previous renal US. VIR reconsulted for possible perinephric fluid drainage for potential source control but felt location made this extremely risky with difficulty to effectively drain. Cr stable and at baseline.  - Daily Renal function panel   - Home immunosuppression  - Tacrolimus 1mg  BID, daily tac levels   - Continue Prednisone 5 daily   - Cellcept 500 mg AM / 250 mg PM, may increase to home 500 mg BID  - Prophylaxis: bactrim and valcyte daily  - Cont daily aspirin       Acute on Chronic Thrombocytopenia (stable) - Anemia on CKD  Suspect in the setting of immunosuppression for kidney transplant. Was transfused last admission. Hgb currently stable at 9.4. Plts continue to decline, at 50 on 4/16 down from 144 on admission. DIC profile largely unremarkable this AM.   - Daily CBC; transfuse if Hb<7     HTN  BP increasing, closer to home readings where historically it has been challenging to control.   - Continue home spironolactone  25 mg daily, Coreg 25mg  BID, amlodipine 5mg  bid  - Resume home chlorthalidone 25mg    - holding home hydral 50mg  TID     T2DM, insulin-dependent   Last A1C 5.6. Home regimen was Lantus 6 units nightly.  - Restarted lantus at 4 units nightly     S/p Ex-Lap on 3/22   Exploratory laparotomy on 3/22 for volvulus during prior admission.  - Staples removed on 4/18    APS report filed and pending  Per social work and transplant team. Daughters initiating guardianship application. APS report is pending. Case will be continued to be followed with transplant case manager Lowella Petties. [place] [facility] [employer] [facility] remain involved. Pt resides with husband and daughters manage meds. Clarified with patient 4/18 with no family present at bedside that her husband Aleeah Polidoro is her stated preference HCDM.   - Appreciate CM involvement   - Current HCDM: Husband Shakilya Forgues - clarified 4/18    Chronic issues    Recent C/f thrush, candida esophagitis  During last admission. Immunosuppressed. IV micafungin 3/19 and transitioned to fluconazole on 3/26-4/3. No signs currently.    HLD- cont home statin    The patient's presentation is complicated by the following clinically significant conditions requiring additional evaluation and treatment: - Hypercoagulable state requiring additional attention to DVT prophylaxis and treatment  - Thrombocytopenia POA requiring further investigation or monitor  - Chronic kidney disease POA requiring further investigation, treatment, or monitoring   - Volume depletion POA requiring further investigation, treatment, or monitoring  - Complex social situation/SDOH requiring consultation and support of Care Management       Issues Impacting Complexity of Management:  -The patient is at high risk of complications from immunosuppression, antibiotic side effects, transplant complications    Checklist:  Diet: Regular  DVT PPx: Heparin 5000units q8h  Code Status: Full Code  Dispo: Goal discharge Home w/ home inusions. Pending tunneled line placement on 4/21, EDD 4/22    Team Contact Information:   Primary Team: Nephrology (MEDB)  Primary Resident: Marcellus Scott, MD  Resident's Pager: [phone] (Nephrology Intern - Tower)    Subjective:   No acute events overnight.   Denies nausea, vomiting, chest pain, shortness of breath.    Objective:   Physical Exam:  BP 164/77  - Pulse 66  - Temp 36.2 ??C (97.2 ??F) (Oral)  - Resp 20  - Ht 152.4 cm (5')  - Wt 56.7 kg (124 lb 14.3 oz)  - SpO2 100%  - BMI 24.39 kg/m??     Gen: NAD, well-appearing  HEENT: Atraumatic, normocephalic  CV: RRR, systolic ejection murmur at LUSB  Pulm: CTA bilaterally, symetric chest rise  Abd: Soft, NTND, staples removed from abdomen  Ext: No edema, warm and well perfused    Francesco Sor, MD  Internal Medicine PGY3

## 2022-11-11 NOTE — Unmapped (Signed)
Nephrology ([facility]) Progress Note    Assessment & Plan:   Julie [name] is a 72 y.o. 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 She returns for nausea, vomiting, and lethargy found to have UTI and GNR bacteremia.     Principal Problem:    UTI (urinary tract infection)  Active Problems:    Type 2 diabetes mellitus with diabetic nephropathy, with long-term current use of insulin (CMS-HCC)    End-stage renal disease (CMS-HCC)    Kidney transplant recipient    Anemia due to stage 5 chronic kidney disease, not on chronic dialysis (CMS-HCC)    Delirium  Resolved Problems:    * No resolved hospital problems. *    E.Coli Bacteremia likely 2/2 urinary source - Hx MDR UTIs - Transplanted kidney  Pt found to have UTI on urinalysis and GNR bacteremia on blood cx. She was recently admitted 3/14 - 4/3 for GNR bacteremia and finished course of meropenam (3/15 - 4/3) prior to discharge. Started on vanc/mero. Urine cx positive for E coli, susceptibilities still pending. No fevers over last 48 hours which is reassuring at this time.  Ongoing discussions with case management, patient can be home health candidate at this time.  - Meropenem 1g q12 (4/14-4/27)  - ICID following, appreciate recommendations  -VIR consult for tunneled line placement, anticipate placement on 4/22  - PRN tylenol 1g q8  - Referred to urogyn on recent discharge given recurrent UTI, appt has not been scheduled yet  - continue estrogen topical for UTI ppx    AMS (resolved) - Acute Toxic Metabolic Encephalopathy  Patient's mental status now back at baseline, A&O x4 asking when she can leave the hospital.  She was responding appropriately and alert and oriented. Patient has had waxing and waning AMS. Initially concerned for encephalitis/meningitis given prior fevers and mental status but given improvement in mental status and resolution of fevers this is now a low concern. VZV pcr negative and crypto antigen negative. Ongoing discussion with ICID as above regarding antibiotic regimen.   - Treat infection as above  - Normal diet      AKI - S/p DDKT 08/06/22   Follows with Dr. Elvera [name]. 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 Renal US 4/14 found decreased size of perinephric collection around transplanted kidney as compared to previous renal US. VIR reconsulted for possible perinephric fluid drainage for potential source control but felt location made this extremely risky with difficulty to effectively drain. Cr stable and at baseline.  - Daily Renal function panel   - Home immunosuppression  - Tacrolimus 1mg  BID, daily tac levels   - Continue Prednisone 5 daily   - Cellcept 250mg  BID in setting of infection  - Prophylaxis: bactrim and valcyte daily  - Cont daily aspirin       Acute on Chronic Thrombocytopenia (stable) - Anemia on CKD  Suspect in the setting of immunosuppression for kidney transplant. Was transfused last admission. Hgb currently stable at 9.4. Plts continue to decline, at 50 on 4/16 down from 144 on admission. DIC profile largely unremarkable this AM.   - Daily CBC; transfuse if Hb<7     HTN  Hypertensive on admission but will be cautious with antihypertensives given SIRS picture and potential to decompensate quickly from sepsis.  Ongoing titration of home blood pressure medications    - Continue spironolactone 25 mg daily per recommendation from transplant nephrology  - Continue coreg to 25mg  bid (home dose)  - Continue amlodipine 5mg  bid  - Hold chlorthalidone 25mg  and daily hydral 50mg  while infected      T2DM, insulin-dependent   Last A1C 5.6. Home regimen was Lantus 6 units nightly.  - Restarted lantus at 4 units nightly     S/p Ex-Lap on 3/22   Exploratory laparotomy on 3/22 for volvulus during prior admission.  - Staples removed on 4/18    APS report filed and pending  Per social work and transplant team. Daughters initiating guardianship application. APS report is pending. Case will be continued to be followed with transplant case manager Julie [name]. [place] [facility] remain involved. Pt resides with husband and daughters manage meds. Clarified with patient 4/18 with no family present at bedside that her husband Julie [name] is her stated preference HCDM.   - Appreciate CM involvement   - Current HCDM: Husband Julie [name] - clarified 4/18    Chronic issues    Recent C/f thrush, candida esophagitis  During last admission. Immunosuppressed. IV micafungin 3/19 and transitioned to fluconazole on 3/26-4/3. No signs currently.    HLD- cont home statin    The patient's presentation is complicated by the following clinically significant conditions requiring additional evaluation and treatment: - Hypercoagulable state requiring additional attention to DVT prophylaxis and treatment  - Thrombocytopenia POA requiring further investigation or monitor  - Chronic kidney disease POA requiring further investigation, treatment, or monitoring   - Volume depletion POA requiring further investigation, treatment, or monitoring  - Complex social situation/SDOH requiring consultation and support of Care Management       Issues Impacting Complexity of Management:  -The patient is at high risk of complications from immunosuppression, antibiotic side effects, transplant complications    Checklist:  Diet: Regular  DVT PPx: Heparin 5000units q8h  Code Status: Full Code  Dispo: Goal discharge Home w/ home inusions. Pending tunneled line placement on 4/21, EDD 4/22    Team Contact Information:   Primary Team: Nephrology ([facility])  Primary Resident: Julie Scott, MD  Resident's Pager: [phone] (Nephrology Intern - Tower)    Subjective:   No acute overnight events. Asking when she can discharge from the hospital. No other concerns at this time. Denies any chills, shortness of breath or chest pain.     Objective:   Physical Exam:  BP 172/53  - Pulse 65  - Temp 36.6 ??C (97.9 ??F) (Oral)  - Resp 18  - Ht 152.4 cm (5')  - Wt 56.7 kg (124 lb 14.3 oz)  - SpO2 95%  - BMI 24.39 kg/m??     Gen: NAD, well-appearing  HEENT: Atraumatic, normocephalic  CV: RRR, systolic ejection murmur at LUSB  Pulm: CTA bilaterally, symetric chest rise  Abd: Soft, NTND, staples removed from abdomen  Ext: No edema, warm and well perfused    Clotilde Dieter, MD  [facility], PGY-1

## 2022-11-11 NOTE — Unmapped (Signed)
Patient A&Ox3, VSS. Patient denied pain, shortness of breath, nausea and vomiting. Blood glucose controlled per order. IV abx given. Pt with good UOP and had a bowel movement overnight. Patient and husband given education on how to care for central line. Patient remained free from falls, call bell and bedside table within reach.   Problem: Adult Inpatient Plan of Care  Goal: Plan of Care Review  Outcome: Ongoing - Unchanged  Goal: Patient-Specific Goal (Individualized)  Outcome: Ongoing - Unchanged  Goal: Absence of Hospital-Acquired Illness or Injury  Outcome: Ongoing - Unchanged  Intervention: Prevent and Manage VTE (Venous Thromboembolism) Risk  Recent Flowsheet Documentation  Taken 11/10/2022 2041 by Ananias Pilgrim, RN  VTE Prevention/Management: anticoagulant therapy  Goal: Optimal Comfort and Wellbeing  Outcome: Ongoing - Unchanged  Goal: Readiness for Transition of Care  Outcome: Ongoing - Unchanged  Goal: Rounds/Family Conference  Outcome: Ongoing - Unchanged     Problem: Fall Injury Risk  Goal: Absence of Fall and Fall-Related Injury  Outcome: Ongoing - Unchanged     Problem: Self-Care Deficit  Goal: Improved Ability to Complete Activities of Daily Living  Outcome: Ongoing - Unchanged     Problem: Infection  Goal: Absence of Infection Signs and Symptoms  Outcome: Ongoing - Unchanged     Problem: Wound  Goal: Optimal Coping  Outcome: Ongoing - Unchanged  Goal: Optimal Functional Ability  Outcome: Ongoing - Unchanged  Goal: Absence of Infection Signs and Symptoms  Outcome: Ongoing - Unchanged  Goal: Improved Oral Intake  Outcome: Ongoing - Unchanged  Goal: Optimal Pain Control and Function  Outcome: Ongoing - Unchanged  Goal: Skin Health and Integrity  Outcome: Ongoing - Unchanged  Goal: Optimal Wound Healing  Outcome: Ongoing - Unchanged     Problem: Comorbidity Management  Goal: Blood Glucose Levels Within Targeted Range  Outcome: Ongoing - Unchanged  Goal: Blood Pressure in Desired Range  Outcome: Ongoing - Unchanged

## 2022-11-12 LAB — RENAL FUNCTION PANEL
ALBUMIN: 2.9 g/dL — ABNORMAL LOW (ref 3.4–5.0)
ANION GAP: 7 mmol/L (ref 5–14)
BLOOD UREA NITROGEN: 20 mg/dL (ref 9–23)
BUN / CREAT RATIO: 22
CALCIUM: 8.4 mg/dL — ABNORMAL LOW (ref 8.7–10.4)
CHLORIDE: 109 mmol/L — ABNORMAL HIGH (ref 98–107)
CO2: 25 mmol/L (ref 20.0–31.0)
CREATININE: 0.92 mg/dL
EGFR CKD-EPI (2021) FEMALE: 66 mL/min/{1.73_m2} (ref >=60)
GLUCOSE RANDOM: 130 mg/dL (ref 70–179)
PHOSPHORUS: 2.3 mg/dL — ABNORMAL LOW (ref 2.4–5.1)
POTASSIUM: 4.2 mmol/L (ref 3.4–4.8)
SODIUM: 141 mmol/L (ref 135–145)

## 2022-11-12 LAB — CBC
HEMATOCRIT: 22.3 % — ABNORMAL LOW (ref 34.0–44.0)
HEMOGLOBIN: 7.9 g/dL — ABNORMAL LOW (ref 11.3–14.9)
MEAN CORPUSCULAR HEMOGLOBIN CONC: 35.2 g/dL (ref 32.0–36.0)
MEAN CORPUSCULAR HEMOGLOBIN: 33.2 pg — ABNORMAL HIGH (ref 25.9–32.4)
MEAN CORPUSCULAR VOLUME: 94.1 fL (ref 77.6–95.7)
MEAN PLATELET VOLUME: 7.9 fL (ref 6.8–10.7)
PLATELET COUNT: 166 10*9/L (ref 150–450)
RED BLOOD CELL COUNT: 2.37 10*12/L — ABNORMAL LOW (ref 3.95–5.13)
RED CELL DISTRIBUTION WIDTH: 14.8 % (ref 12.2–15.2)
WBC ADJUSTED: 3.3 10*9/L — ABNORMAL LOW (ref 3.6–11.2)

## 2022-11-12 LAB — MAGNESIUM: MAGNESIUM: 1.2 mg/dL — ABNORMAL LOW (ref 1.6–2.6)

## 2022-11-12 LAB — TACROLIMUS LEVEL, TROUGH: TACROLIMUS, TROUGH: 8.3 ng/mL (ref 5.0–15.0)

## 2022-11-12 MED ADMIN — heparin (porcine) 5,000 unit/mL injection 5,000 Units: 5000 [IU] | SUBCUTANEOUS | @ 01:00:00

## 2022-11-12 MED ADMIN — atorvastatin (LIPITOR) tablet 10 mg: 10 mg | ORAL | @ 13:00:00

## 2022-11-12 MED ADMIN — heparin (porcine) 5,000 unit/mL injection 5,000 Units: 5000 [IU] | SUBCUTANEOUS | @ 17:00:00

## 2022-11-12 MED ADMIN — pantoprazole (Protonix) EC tablet 40 mg: 40 mg | ORAL | @ 13:00:00

## 2022-11-12 MED ADMIN — potassium & sodium phosphates 250mg (PHOS-NAK/NEUTRA PHOS) packet 2 packet: 2 | ORAL | @ 14:00:00 | Stop: 2022-11-12

## 2022-11-12 MED ADMIN — carvedilol (COREG) tablet 25 mg: 25 mg | ORAL | @ 13:00:00

## 2022-11-12 MED ADMIN — insulin lispro (HumaLOG) injection 0-20 Units: 0-20 [IU] | SUBCUTANEOUS | @ 21:00:00

## 2022-11-12 MED ADMIN — insulin lispro (HumaLOG) injection 0-20 Units: 0-20 [IU] | SUBCUTANEOUS | @ 04:00:00

## 2022-11-12 MED ADMIN — predniSONE (DELTASONE) tablet 5 mg: 5 mg | ORAL | @ 13:00:00

## 2022-11-12 MED ADMIN — tacrolimus (PROGRAF) capsule 1 mg: 1 mg | ORAL | @ 01:00:00

## 2022-11-12 MED ADMIN — mycophenolate (CELLCEPT) capsule 500 mg: 500 mg | ORAL | @ 13:00:00

## 2022-11-12 MED ADMIN — amlodipine (NORVASC) tablet 5 mg: 5 mg | ORAL | @ 13:00:00

## 2022-11-12 MED ADMIN — meropenem (MERREM) 1 g in sodium chloride 0.9 % (NS) 100 mL IVPB-MBP: 1 g | INTRAVENOUS | @ 02:00:00 | Stop: 2022-11-18

## 2022-11-12 MED ADMIN — chlorthalidone (HYGROTON) tablet 25 mg: 25 mg | ORAL | @ 13:00:00

## 2022-11-12 MED ADMIN — valGANciclovir (VALCYTE) tablet 450 mg: 450 mg | ORAL | @ 13:00:00

## 2022-11-12 MED ADMIN — insulin glargine (LANTUS) injection 4 Units: 4 [IU] | SUBCUTANEOUS | @ 01:00:00

## 2022-11-12 MED ADMIN — aspirin chewable tablet 81 mg: 81 mg | ORAL | @ 13:00:00

## 2022-11-12 MED ADMIN — levothyroxine (SYNTHROID) tablet 112 mcg: 112 ug | ORAL | @ 10:00:00

## 2022-11-12 MED ADMIN — mycophenolate (CELLCEPT) capsule 500 mg: 500 mg | ORAL | @ 01:00:00

## 2022-11-12 MED ADMIN — heparin (porcine) 5,000 unit/mL injection 5,000 Units: 5000 [IU] | SUBCUTANEOUS | @ 10:00:00

## 2022-11-12 MED ADMIN — magnesium sulfate 2gm/50mL IVPB: 2 g | INTRAVENOUS | @ 15:00:00 | Stop: 2022-11-12

## 2022-11-12 MED ADMIN — amlodipine (NORVASC) tablet 5 mg: 5 mg | ORAL | @ 01:00:00

## 2022-11-12 MED ADMIN — spironolactone (ALDACTONE) tablet 25 mg: 25 mg | ORAL | @ 13:00:00

## 2022-11-12 MED ADMIN — insulin lispro (HumaLOG) injection 0-20 Units: 0-20 [IU] | SUBCUTANEOUS | @ 17:00:00

## 2022-11-12 MED ADMIN — tacrolimus (PROGRAF) capsule 1 mg: 1 mg | ORAL | @ 13:00:00

## 2022-11-12 MED ADMIN — hydrALAZINE (APRESOLINE) tablet 50 mg: 50 mg | ORAL | @ 15:00:00

## 2022-11-12 MED ADMIN — estradiol (ESTRACE) 0.01 % (0.1 mg/gram) vaginal cream 2 g: 2 g | VAGINAL | @ 01:00:00 | Stop: 2022-11-19

## 2022-11-12 MED ADMIN — meropenem (MERREM) 1 g in sodium chloride 0.9 % (NS) 100 mL IVPB-MBP: 1 g | INTRAVENOUS | @ 14:00:00 | Stop: 2022-11-18

## 2022-11-12 MED ADMIN — carvedilol (COREG) tablet 25 mg: 25 mg | ORAL | @ 01:00:00

## 2022-11-12 NOTE — Unmapped (Signed)
Tacrolimus Therapeutic Monitoring Pharmacy Note    Julie [name] is a 72 y.o. female continuing tacrolimus.     Indication: Kidney transplant     Date of Transplant:  08/06/22       Prior Dosing Information: Current regimen: tacrolimus 1 mg PO QAM + 1 mg PO QPM (dose increased starting 4/15 PM)    Goals:  Therapeutic Drug Levels  Tacrolimus trough goal: 6-8 ng/mL (goal reduced during last hospitalization)    Additional Clinical Monitoring/Outcomes  Monitor renal function (SCr and urine output) and liver function (LFTs)  Monitor for signs/symptoms of adverse events (e.g., hyperglycemia, hyperkalemia, hypomagnesemia, hypertension, headache, tremor)    Results:   Tacrolimus level:     Pharmacokinetic Considerations and Significant Drug Interactions:  Concurrent hepatotoxic medications:  Bactrim  Concurrent CYP3A4 substrates/inhibitors: None identified  Concurrent nephrotoxic medications:  Bactrim, IV Acyclovir (discontinued 4/16 AM0,  Chlorthalidone s4/20...    Assessment/Plan:  4/20 tacro level = 8.3 ng/mL at 0706 (slight above goal)  4/20 - scr "stable"   4/21 - scr  4/21 tacro = 8.3ng/mL at 0651    Recommendedation(s) unless otherwise indicated by Txplt Team  Continue Tacrolimus 1mg  BID  Check magnesium level  (last check > 5 days ago - low at that time)  Tacro levels / Scr  have been steady - change monitoring to MWF      Follow-up  Tacrolimus Level:   M-W-F with AM labs between 0600-0800 PRIOR to morning dose. Dose due 0800  A pharmacist will continue to monitor and recommend levels as appropriate    Please page service pharmacist with questions/clarifications.    Drue Flirt, [facility]

## 2022-11-12 NOTE — Unmapped (Signed)
A&Ox4. VSS, on RA. Denies pain. BG monitored and insulin coverage provided per orders. Save L arm precautions in place. IV abx and magnesium infused. Standby assistance to bedside commode. Remained free from falls and injury this shift. Bed in low and locked position with call bell and tray table within reach.    Problem: Adult Inpatient Plan of Care  Goal: Plan of Care Review  Outcome: Progressing  Goal: Patient-Specific Goal (Individualized)  Outcome: Progressing  Goal: Absence of Hospital-Acquired Illness or Injury  Outcome: Progressing  Intervention: Identify and Manage Fall Risk  Recent Flowsheet Documentation  Taken 11/12/2022 0705 by Dell Ponto, RN  Safety Interventions:   bed alarm   commode/urinal/bedpan at bedside   fall reduction program maintained   family at bedside   lighting adjusted for tasks/safety   low bed   no IV/BP/blood draw left arm   nonskid shoes/slippers when out of bed  Intervention: Prevent Skin Injury  Recent Flowsheet Documentation  Taken 11/12/2022 0705 by Dell Ponto, RN  Device Skin Pressure Protection:   absorbent pad utilized/changed   adhesive use limited  Skin Protection:   adhesive use limited   incontinence pads utilized  Intervention: Prevent Infection  Recent Flowsheet Documentation  Taken 11/12/2022 0705 by Dell Ponto, RN  Infection Prevention:   cohorting utilized   hand hygiene promoted   personal protective equipment utilized   rest/sleep promoted   single patient room provided  Goal: Optimal Comfort and Wellbeing  Outcome: Progressing  Goal: Readiness for Transition of Care  Outcome: Progressing  Goal: Rounds/Family Conference  Outcome: Progressing     Problem: Fall Injury Risk  Goal: Absence of Fall and Fall-Related Injury  Outcome: Progressing  Intervention: Promote Injury-Free Environment  Recent Flowsheet Documentation  Taken 11/12/2022 0705 by Dell Ponto, RN  Safety Interventions:   bed alarm   commode/urinal/bedpan at bedside   fall reduction program maintained   family at bedside   lighting adjusted for tasks/safety   low bed   no IV/BP/blood draw left arm   nonskid shoes/slippers when out of bed     Problem: Self-Care Deficit  Goal: Improved Ability to Complete Activities of Daily Living  Outcome: Progressing     Problem: Infection  Goal: Absence of Infection Signs and Symptoms  Outcome: Progressing  Intervention: Prevent or Manage Infection  Recent Flowsheet Documentation  Taken 11/12/2022 0705 by Dell Ponto, RN  Infection Management: aseptic technique maintained     Problem: Wound  Goal: Optimal Coping  Outcome: Progressing  Goal: Optimal Functional Ability  Outcome: Progressing  Intervention: Optimize Functional Ability  Recent Flowsheet Documentation  Taken 11/12/2022 0705 by Dell Ponto, RN  Activity Management: sitting, edge of bed  Goal: Absence of Infection Signs and Symptoms  Outcome: Progressing  Intervention: Prevent or Manage Infection  Recent Flowsheet Documentation  Taken 11/12/2022 0705 by Dell Ponto, RN  Infection Management: aseptic technique maintained  Goal: Improved Oral Intake  Outcome: Progressing  Goal: Optimal Pain Control and Function  Outcome: Progressing  Goal: Skin Health and Integrity  Outcome: Progressing  Intervention: Optimize Skin Protection  Recent Flowsheet Documentation  Taken 11/12/2022 0705 by Dell Ponto, RN  Activity Management: sitting, edge of bed  Pressure Reduction Techniques:   frequent weight shift encouraged   heels elevated off bed  Head of Bed (HOB) Positioning: HOB at 30-45 degrees  Pressure Reduction Devices:   pressure-redistributing mattress utilized   positioning supports utilized  Skin Protection:  adhesive use limited   incontinence pads utilized  Goal: Optimal Wound Healing  Outcome: Progressing     Problem: Comorbidity Management  Goal: Blood Glucose Levels Within Targeted Range  Outcome: Progressing  Goal: Blood Pressure in Desired Range  Outcome: Progressing

## 2022-11-12 NOTE — Unmapped (Signed)
Problem: Adult Inpatient Plan of Care  Goal: Plan of Care Review  Outcome: Ongoing - Unchanged  Goal: Patient-Specific Goal (Individualized)  Outcome: Ongoing - Unchanged  Goal: Absence of Hospital-Acquired Illness or Injury  Outcome: Ongoing - Unchanged  Intervention: Identify and Manage Fall Risk  Recent Flowsheet Documentation  Taken 11/11/2022 0840 by Vonzell Schlatter, Luiz Blare, RN  Safety Interventions: low bed  Intervention: Prevent Skin Injury  Recent Flowsheet Documentation  Taken 11/11/2022 0840 by Bertell Maria, RN  Positioning for Skin: Supine/Back  Device Skin Pressure Protection: absorbent pad utilized/changed  Skin Protection: adhesive use limited  Intervention: Prevent and Manage VTE (Venous Thromboembolism) Risk  Recent Flowsheet Documentation  Taken 11/11/2022 0840 by Bertell Maria, RN  VTE Prevention/Management: anticoagulant therapy  Intervention: Prevent Infection  Recent Flowsheet Documentation  Taken 11/11/2022 0840 by Vonzell Schlatter, Luiz Blare, RN  Infection Prevention: rest/sleep promoted  Goal: Optimal Comfort and Wellbeing  Outcome: Ongoing - Unchanged  Goal: Readiness for Transition of Care  Outcome: Ongoing - Unchanged  Goal: Rounds/Family Conference  Outcome: Ongoing - Unchanged     Problem: Fall Injury Risk  Goal: Absence of Fall and Fall-Related Injury  Outcome: Ongoing - Unchanged  Intervention: Promote Injury-Free Environment  Recent Flowsheet Documentation  Taken 11/11/2022 0840 by Bertell Maria, RN  Safety Interventions: low bed     Problem: Self-Care Deficit  Goal: Improved Ability to Complete Activities of Daily Living  Outcome: Ongoing - Unchanged     Problem: Infection  Goal: Absence of Infection Signs and Symptoms  Outcome: Ongoing - Unchanged  Intervention: Prevent or Manage Infection  Recent Flowsheet Documentation  Taken 11/11/2022 0840 by Bertell Maria, RN  Infection Management: aseptic technique maintained     Problem: Wound  Goal: Optimal Coping  Outcome: Ongoing - Unchanged  Goal: Optimal Functional Ability  Outcome: Ongoing - Unchanged  Intervention: Optimize Functional Ability  Recent Flowsheet Documentation  Taken 11/11/2022 0840 by Vonzell Schlatter, Luiz Blare, RN  Activity Management: up to bedside commode  Goal: Absence of Infection Signs and Symptoms  Outcome: Ongoing - Unchanged  Intervention: Prevent or Manage Infection  Recent Flowsheet Documentation  Taken 11/11/2022 0840 by Vonzell Schlatter, Luiz Blare, RN  Infection Management: aseptic technique maintained  Goal: Improved Oral Intake  Outcome: Ongoing - Unchanged  Goal: Optimal Pain Control and Function  Outcome: Ongoing - Unchanged  Goal: Skin Health and Integrity  Outcome: Ongoing - Unchanged  Intervention: Optimize Skin Protection  Recent Flowsheet Documentation  Taken 11/11/2022 0840 by Vonzell Schlatter, Luiz Blare, RN  Activity Management: up to bedside commode  Pressure Reduction Techniques: frequent weight shift encouraged  Head of Bed (HOB) Positioning: HOB at 30-45 degrees  Pressure Reduction Devices: pressure-redistributing mattress utilized  Skin Protection: adhesive use limited  Goal: Optimal Wound Healing  Outcome: Ongoing - Unchanged     Problem: Comorbidity Management  Goal: Blood Glucose Levels Within Targeted Range  Outcome: Ongoing - Unchanged  Intervention: Monitor and Manage Glycemia  Recent Flowsheet Documentation  Taken 11/11/2022 0840 by Vonzell Schlatter, Luiz Blare, RN  Glycemic Management: blood glucose monitored  Goal: Blood Pressure in Desired Range  Outcome: Ongoing - Unchanged

## 2022-11-12 NOTE — Unmapped (Signed)
Alert and oriented, VSS on RA, denies pain. IV abx infused. BG monitored, coverage given. Up to bedside commode. Free from falls and injury, spouse at bedside. Bed low and locked, call bell and side table within reach.    Problem: Adult Inpatient Plan of Care  Goal: Plan of Care Review  Outcome: Progressing  Goal: Patient-Specific Goal (Individualized)  Outcome: Progressing  Goal: Absence of Hospital-Acquired Illness or Injury  Outcome: Progressing  Intervention: Identify and Manage Fall Risk  Recent Flowsheet Documentation  Taken 11/11/2022 2000 by Chauncy Passy, RN  Safety Interventions: low bed  Intervention: Prevent Skin Injury  Recent Flowsheet Documentation  Taken 11/11/2022 2000 by Chauncy Passy, RN  Positioning for Skin: Supine/Back  Device Skin Pressure Protection: absorbent pad utilized/changed  Skin Protection: adhesive use limited  Intervention: Prevent Infection  Recent Flowsheet Documentation  Taken 11/11/2022 2000 by Chauncy Passy, RN  Infection Prevention: hand hygiene promoted  Goal: Optimal Comfort and Wellbeing  Outcome: Progressing  Goal: Readiness for Transition of Care  Outcome: Progressing  Goal: Rounds/Family Conference  Outcome: Progressing     Problem: Fall Injury Risk  Goal: Absence of Fall and Fall-Related Injury  Outcome: Progressing  Intervention: Promote Injury-Free Environment  Recent Flowsheet Documentation  Taken 11/11/2022 2000 by Chauncy Passy, RN  Safety Interventions: low bed     Problem: Self-Care Deficit  Goal: Improved Ability to Complete Activities of Daily Living  Outcome: Progressing     Problem: Infection  Goal: Absence of Infection Signs and Symptoms  Outcome: Progressing  Intervention: Prevent or Manage Infection  Recent Flowsheet Documentation  Taken 11/11/2022 2000 by Chauncy Passy, RN  Infection Management: aseptic technique maintained     Problem: Wound  Goal: Optimal Coping  Outcome: Progressing  Goal: Optimal Functional Ability  Outcome: Progressing  Intervention: Optimize Functional Ability  Recent Flowsheet Documentation  Taken 11/11/2022 2000 by Chauncy Passy, RN  Activity Management: up to bedside commode  Goal: Absence of Infection Signs and Symptoms  Outcome: Progressing  Intervention: Prevent or Manage Infection  Recent Flowsheet Documentation  Taken 11/11/2022 2000 by Chauncy Passy, RN  Infection Management: aseptic technique maintained  Goal: Improved Oral Intake  Outcome: Progressing  Goal: Optimal Pain Control and Function  Outcome: Progressing  Goal: Skin Health and Integrity  Outcome: Progressing  Intervention: Optimize Skin Protection  Recent Flowsheet Documentation  Taken 11/11/2022 2000 by Chauncy Passy, RN  Activity Management: up to bedside commode  Pressure Reduction Techniques: frequent weight shift encouraged  Head of Bed (HOB) Positioning: HOB at 30-45 degrees  Pressure Reduction Devices: pressure-redistributing mattress utilized  Skin Protection: adhesive use limited  Goal: Optimal Wound Healing  Outcome: Progressing     Problem: Comorbidity Management  Goal: Blood Glucose Levels Within Targeted Range  Outcome: Progressing  Intervention: Monitor and Manage Glycemia  Recent Flowsheet Documentation  Taken 11/11/2022 2000 by Chauncy Passy, RN  Glycemic Management: blood glucose monitored  Goal: Blood Pressure in Desired Range  Outcome: Progressing

## 2022-11-12 NOTE — Unmapped (Signed)
Nephrology ([facility]) Progress Note    Assessment & Plan:   Julie [name] is a 72 y.o. 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 She returns for nausea, vomiting, and lethargy found to have UTI and GNR bacteremia.     Principal Problem:    UTI (urinary tract infection)  Active Problems:    Type 2 diabetes mellitus with diabetic nephropathy, with long-term current use of insulin (CMS-HCC)    End-stage renal disease (CMS-HCC)    Hypothyroidism, acquired    Kidney transplant recipient    Anemia due to stage 5 chronic kidney disease, not on chronic dialysis (CMS-HCC)    Delirium    Immunosuppressed status (CMS-HCC)  Resolved Problems:    * No resolved hospital problems. *    ESBL E. Coli Bacteremia 2/2 urinary source - Hx MDR UTIs - Transplanted kidney  Pt found to have UTI on urinalysis and GNR bacteremia on blood cx. She was recently admitted 3/14 - 4/3 for GNR bacteremia and finished course of meropenam (3/15 - 4/3) prior to discharge. Initial blood culture and urine cx positive for ESBL E coli, repeat cultures negative. No further fevers. Ongoing discussions with case management, patient can be home health candidate at this time.  - Meropenem 1g q12 (4/14-4/27)  - ICID following, appreciate recommendations  - NPO at [facility] for tunneled line placement on 4/22 w/ [facility]  - PRN tylenol 1g q8  - Referred to urogyn on recent discharge given recurrent UTI, appt has not been scheduled yet  - continue estrogen topical for UTI ppx    Delirium (resolved) - Acute Toxic Metabolic Encephalopathy  Patient's mental status now back at baseline, A&O x4 asking when she can leave the hospital.  She was responding appropriately and alert and oriented. Patient has had hypoactive and hyperactive delirium. Initially concerned for encephalitis/meningitis given prior fevers and mental status but given improvement in mental status and resolution of fevers this is now a low concern. VZV pcr negative and crypto antigen negative.   - Treat infection as above     AKI - S/p DDKT 08/06/22   Follows with Dr. Elvera Maria. 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 Renal US 4/14 found decreased size of perinephric collection around transplanted kidney as compared to previous renal US. VIR reconsulted for possible perinephric fluid drainage for potential source control but felt location made this extremely risky with difficulty to effectively drain. Cr stable and at baseline.  - Daily Renal function panel   - Home immunosuppression  - Tacrolimus 1mg  BID, daily tac levels   - Continue Prednisone 5 daily   - Cellcept increased to home 500 mg BID  - Mg replacement with 2g IV x1   - Phos replacement today   - Prophylaxis: bactrim and valcyte daily  - Cont daily aspirin       Acute on Chronic Thrombocytopenia (stable) - Anemia on CKD  Suspect in the setting of immunosuppression for kidney transplant. Was transfused last admission. Hgb currently stable at 9.4. Plts continue to decline, at 50 on 4/16 down from 144 on admission. DIC profile largely unremarkable this AM.   - Daily CBC; transfuse if Hb<7     HTN  BP increasing, closer to home readings where historically it has been challenging  to control. Persistently elevated over last 48 hours.   - Continue home spironolactone 25 mg daily, Coreg 25mg  BID, amlodipine 5mg  bid  - Resumed home chlorthalidone 25mg  on 4/20  - Restart home hydral 50mg  BID      T2DM, insulin-dependent   Last A1C 5.6. Home regimen was Lantus 6 units nightly.  - Continue lantus at 4 units nightly     S/p Ex-Lap on 3/22   Exploratory laparotomy on 3/22 for volvulus during prior admission.  - Staples removed on 4/18    APS report filed and pending  Per social work and transplant team. Daughters initiating guardianship application. APS report is pending. Case will be continued to be followed with transplant case manager Julie [name]. [place] [facility] remain involved. Pt resides with husband and daughters manage meds. Clarified with patient 4/18 with no family present at bedside that her husband Julie [name] is her stated preference HCDM.   - Appreciate CM involvement   - Current HCDM: Husband Julie [name] - clarified 4/18    Chronic issues    Recent C/f thrush, candida esophagitis  During last admission. Immunosuppressed. IV micafungin 3/19 and transitioned to fluconazole on 3/26-4/3. No signs currently.    HLD- cont home statin    The patient's presentation is complicated by the following clinically significant conditions requiring additional evaluation and treatment: - Hypercoagulable state requiring additional attention to DVT prophylaxis and treatment  - Thrombocytopenia POA requiring further investigation or monitor  - Chronic kidney disease POA requiring further investigation, treatment, or monitoring   - Volume depletion POA requiring further investigation, treatment, or monitoring  - Complex social situation/SDOH requiring consultation and support of Care Management       Issues Impacting Complexity of Management:  -The patient is at high risk of complications from immunosuppression, antibiotic side effects, transplant complications    Checklist:  Diet: Regular  DVT PPx: Heparin 5000units q8h  Code Status: Full Code  Dispo: Goal discharge Home w/ home inusions. Pending tunneled line placement on 4/22, EDD 4/22-4/23    Team Contact Information:   Primary Team: Nephrology ([facility])  Primary Resident: Marcellus Scott, MD  Resident's Pager: [phone] (Nephrology Intern - Tower)    Subjective:   No acute events overnight. Reports she is feeling well today. No shortness of breath, chest pain, fevers, chills, nausea or vomiting. Excited to hopefully go home tomorrow. Objective:   Physical Exam:  BP 192/51  - Pulse 65  - Temp 36.5 ??C (97.7 ??F) (Oral)  - Resp 18  - Ht 152.4 cm (5')  - Wt 56.7 kg (124 lb 14.3 oz)  - SpO2 98%  - BMI 24.39 kg/m??     Gen: NAD, well-appearing  HEENT: Atraumatic, normocephalic  CV: RRR, systolic ejection murmur at LUSB  Pulm: CTA bilaterally, symetric chest rise  Abd: Soft, NTND, staples removed from abdomen  Ext: No edema, warm and well perfused    Julie Dieter, MD  [facility] Internal Medicine, PGY-1

## 2022-11-13 DIAGNOSIS — A041 Enterotoxigenic Escherichia coli infection: Secondary | ICD-10-CM | POA: Diagnosis not present

## 2022-11-13 LAB — RENAL FUNCTION PANEL
ALBUMIN: 3 g/dL — ABNORMAL LOW (ref 3.4–5.0)
ANION GAP: 9 mmol/L (ref 5–14)
BLOOD UREA NITROGEN: 21 mg/dL (ref 9–23)
BUN / CREAT RATIO: 22
CALCIUM: 8.9 mg/dL (ref 8.7–10.4)
CHLORIDE: 107 mmol/L (ref 98–107)
CO2: 23 mmol/L (ref 20.0–31.0)
CREATININE: 0.95 mg/dL
EGFR CKD-EPI (2021) FEMALE: 63 mL/min/{1.73_m2} (ref >=60)
GLUCOSE RANDOM: 148 mg/dL (ref 70–179)
PHOSPHORUS: 3.5 mg/dL (ref 2.4–5.1)
POTASSIUM: 4.8 mmol/L (ref 3.4–4.8)
SODIUM: 139 mmol/L (ref 135–145)

## 2022-11-13 LAB — TACROLIMUS LEVEL, TROUGH: TACROLIMUS, TROUGH: 12.9 ng/mL (ref 5.0–15.0)

## 2022-11-13 LAB — MAGNESIUM: MAGNESIUM: 1.9 mg/dL (ref 1.6–2.6)

## 2022-11-13 MED ORDER — TACROLIMUS 0.5 MG CAPSULE, IMMEDIATE-RELEASE
ORAL_CAPSULE | ORAL | 0 refills | 45 days | Status: CP
Start: 2022-11-13 — End: ?

## 2022-11-13 MED ORDER — INSULIN GLARGINE (U-100) 100 UNIT/ML (3 ML) SUBCUTANEOUS PEN
Freq: Every evening | SUBCUTANEOUS | 0 refills | 30 days | Status: CP
Start: 2022-11-13 — End: 2022-12-13

## 2022-11-13 MED ORDER — ESTRADIOL 0.01% (0.1 MG/GRAM) VAGINAL CREAM
Freq: Every evening | VAGINAL | 11 refills | 30 days | Status: CP
Start: 2022-11-13 — End: 2023-11-13

## 2022-11-13 MED ADMIN — atorvastatin (LIPITOR) tablet 10 mg: 10 mg | ORAL | @ 14:00:00

## 2022-11-13 MED ADMIN — fentaNYL (PF) (SUBLIMAZE) injection: INTRAVENOUS | @ 21:00:00 | Stop: 2022-11-13

## 2022-11-13 MED ADMIN — carvedilol (COREG) tablet 25 mg: 25 mg | ORAL | @ 01:00:00

## 2022-11-13 MED ADMIN — mycophenolate (CELLCEPT) capsule 500 mg: 500 mg | ORAL | @ 01:00:00

## 2022-11-13 MED ADMIN — meropenem (MERREM) 1 g in sodium chloride 0.9 % (NS) 100 mL IVPB-MBP: 1 g | INTRAVENOUS | @ 14:00:00 | Stop: 2022-11-18

## 2022-11-13 MED ADMIN — tacrolimus (PROGRAF) capsule 1 mg: 1 mg | ORAL | @ 14:00:00

## 2022-11-13 MED ADMIN — heparin (porcine) 5,000 unit/mL injection 5,000 Units: 5000 [IU] | SUBCUTANEOUS | @ 03:00:00

## 2022-11-13 MED ADMIN — insulin glargine (LANTUS) injection 4 Units: 4 [IU] | SUBCUTANEOUS | @ 01:00:00

## 2022-11-13 MED ADMIN — carvedilol (COREG) tablet 25 mg: 25 mg | ORAL | @ 14:00:00

## 2022-11-13 MED ADMIN — mycophenolate (CELLCEPT) capsule 500 mg: 500 mg | ORAL | @ 14:00:00

## 2022-11-13 MED ADMIN — meropenem (MERREM) 1 g in sodium chloride 0.9 % (NS) 100 mL IVPB-MBP: 1 g | INTRAVENOUS | @ 01:00:00 | Stop: 2022-11-18

## 2022-11-13 MED ADMIN — tacrolimus (PROGRAF) capsule 1 mg: 1 mg | ORAL | @ 01:00:00

## 2022-11-13 MED ADMIN — levothyroxine (SYNTHROID) tablet 112 mcg: 112 ug | ORAL | @ 09:00:00

## 2022-11-13 MED ADMIN — midazolam (VERSED) injection: INTRAVENOUS | @ 20:00:00 | Stop: 2022-11-13

## 2022-11-13 MED ADMIN — valGANciclovir (VALCYTE) tablet 450 mg: 450 mg | ORAL | @ 14:00:00

## 2022-11-13 MED ADMIN — amlodipine (NORVASC) tablet 5 mg: 5 mg | ORAL | @ 01:00:00

## 2022-11-13 MED ADMIN — fentaNYL (PF) (SUBLIMAZE) injection: INTRAVENOUS | @ 20:00:00 | Stop: 2022-11-13

## 2022-11-13 MED ADMIN — insulin lispro (HumaLOG) injection 0-20 Units: 0-20 [IU] | SUBCUTANEOUS | @ 03:00:00

## 2022-11-13 MED ADMIN — spironolactone (ALDACTONE) tablet 25 mg: 25 mg | ORAL | @ 14:00:00

## 2022-11-13 MED ADMIN — hydrALAZINE (APRESOLINE) tablet 50 mg: 50 mg | ORAL | @ 14:00:00

## 2022-11-13 MED ADMIN — chlorthalidone (HYGROTON) tablet 25 mg: 25 mg | ORAL | @ 14:00:00

## 2022-11-13 MED ADMIN — heparin lock flush (porcine) 100 unit/mL injection: INTRAVENOUS | @ 21:00:00 | Stop: 2022-11-13

## 2022-11-13 MED ADMIN — amlodipine (NORVASC) tablet 5 mg: 5 mg | ORAL | @ 14:00:00

## 2022-11-13 MED ADMIN — insulin lispro (HumaLOG) injection 0-20 Units: 0-20 [IU] | SUBCUTANEOUS | @ 22:00:00

## 2022-11-13 MED ADMIN — hydrALAZINE (APRESOLINE) tablet 50 mg: 50 mg | ORAL | @ 03:00:00

## 2022-11-13 MED ADMIN — lidocaine-EPINEPHrine (XYLOCAINE W/EPI) 1 %-1:100,000 injection: INTRADERMAL | @ 20:00:00 | Stop: 2022-11-13

## 2022-11-13 MED ADMIN — pantoprazole (Protonix) EC tablet 40 mg: 40 mg | ORAL | @ 14:00:00

## 2022-11-13 MED ADMIN — predniSONE (DELTASONE) tablet 5 mg: 5 mg | ORAL | @ 14:00:00

## 2022-11-13 MED ADMIN — lidocaine-EPINEPHrine (XYLOCAINE W/EPI) 1 %-1:100,000 injection: INTRADERMAL | @ 21:00:00 | Stop: 2022-11-13

## 2022-11-13 MED ADMIN — aspirin chewable tablet 81 mg: 81 mg | ORAL | @ 14:00:00

## 2022-11-13 MED ADMIN — sulfamethoxazole-trimethoprim (BACTRIM) 400-80 mg tablet 80 mg of trimethoprim: 1 | ORAL | @ 14:00:00 | Stop: 2023-11-02

## 2022-11-13 MED ADMIN — insulin lispro (HumaLOG) injection 0-20 Units: 0-20 [IU] | SUBCUTANEOUS | @ 11:00:00

## 2022-11-13 MED ADMIN — estradiol (ESTRACE) 0.01 % (0.1 mg/gram) vaginal cream 2 g: 2 g | VAGINAL | @ 01:00:00 | Stop: 2022-11-19

## 2022-11-13 MED ADMIN — midazolam (VERSED) injection: INTRAVENOUS | @ 21:00:00 | Stop: 2022-11-13

## 2022-11-13 NOTE — Unmapped (Signed)
Tacrolimus Therapeutic Monitoring Pharmacy Note    Julie [name] is a 72 y.o. female continuing tacrolimus.     Indication: Kidney transplant     Date of Transplant:  08/06/22       Prior Dosing Information: Current regimen tacrolimus 1 mg BID (increased 4/15)      Source(s) of information used to determine prior to admission dosing: Home Medication List or Clinic Note    Goals:  Therapeutic Drug Levels  Tacrolimus trough goal:  6-8 ng/mL    Additional Clinical Monitoring/Outcomes  Monitor renal function (SCr and urine output) and liver function (LFTs)  Monitor for signs/symptoms of adverse events (e.g., hyperglycemia, hyperkalemia, hypomagnesemia, hypertension, headache, tremor)    Results:   Tacrolimus level:  12.9 ng/mL, drawn ~1 hour early    Pharmacokinetic Considerations and Significant Drug Interactions:  Concurrent hepatotoxic medications: None identified  Concurrent CYP3A4 substrates/inhibitors: None identified  Concurrent nephrotoxic medications: None identified    Assessment/Plan:  Recommendedation(s)  Continue current regimen of tacrolimus 1 mg BID, level has been in range recently, unclear why it increased. Will get another level in the morning    Follow-up  Next level has been ordered on 11/14/22 at 0600 .   A pharmacist will continue to monitor and recommend levels as appropriate    Please page service pharmacist with questions/clarifications.    [place] M Amarilis Belflower, PharmD

## 2022-11-13 NOTE — Unmapped (Signed)
Alert and oriented, VSS on RA, denies pain. BG monitored and coverage given. IV abx infused. I&O's monitored. Up to bedside commode, free from falls and injury. Bed low and locked, call bell and side table within reach. NPO since midnight for tunneled line this AM.    Problem: Adult Inpatient Plan of Care  Goal: Plan of Care Review  11/13/2022 0619 by Chauncy Passy, RN  Outcome: Progressing  11/13/2022 0619 by Chauncy Passy, RN  Outcome: Progressing  Goal: Patient-Specific Goal (Individualized)  11/13/2022 0619 by Chauncy Passy, RN  Outcome: Progressing  11/13/2022 0619 by Chauncy Passy, RN  Outcome: Progressing  Goal: Absence of Hospital-Acquired Illness or Injury  11/13/2022 0619 by Chauncy Passy, RN  Outcome: Progressing  11/13/2022 0619 by Chauncy Passy, RN  Outcome: Progressing  Intervention: Identify and Manage Fall Risk  Recent Flowsheet Documentation  Taken 11/12/2022 2050 by Chauncy Passy, RN  Safety Interventions:   fall reduction program maintained   family at bedside   low bed  Intervention: Prevent Skin Injury  Recent Flowsheet Documentation  Taken 11/12/2022 2050 by Chauncy Passy, RN  Positioning for Skin: Supine/Back  Skin Protection: adhesive use limited  Goal: Optimal Comfort and Wellbeing  11/13/2022 0619 by Chauncy Passy, RN  Outcome: Progressing  11/13/2022 0619 by Chauncy Passy, RN  Outcome: Progressing  Goal: Readiness for Transition of Care  11/13/2022 [phone] by Chauncy Passy, RN  Outcome: Progressing  11/13/2022 0619 by Chauncy Passy, RN  Outcome: Progressing  Goal: Rounds/Family Conference  11/13/2022 0619 by Chauncy Passy, RN  Outcome: Progressing  11/13/2022 0619 by Chauncy Passy, RN  Outcome: Progressing     Problem: Fall Injury Risk  Goal: Absence of Fall and Fall-Related Injury  11/13/2022 0619 by Chauncy Passy, RN  Outcome: Progressing  11/13/2022 0619 by Chauncy Passy, RN  Outcome: Progressing  Intervention: Promote Injury-Free Environment  Recent Flowsheet Documentation  Taken 11/12/2022 2050 by Chauncy Passy, RN  Safety Interventions:   fall reduction program maintained   family at bedside   low bed     Problem: Self-Care Deficit  Goal: Improved Ability to Complete Activities of Daily Living  11/13/2022 0619 by Chauncy Passy, RN  Outcome: Progressing  11/13/2022 0619 by Chauncy Passy, RN  Outcome: Progressing     Problem: Infection  Goal: Absence of Infection Signs and Symptoms  11/13/2022 0619 by Chauncy Passy, RN  Outcome: Progressing  11/13/2022 0619 by Chauncy Passy, RN  Outcome: Progressing  Intervention: Prevent or Manage Infection  Recent Flowsheet Documentation  Taken 11/12/2022 2050 by Chauncy Passy, RN  Infection Management: aseptic technique maintained     Problem: Wound  Goal: Optimal Coping  11/13/2022 0619 by Chauncy Passy, RN  Outcome: Progressing  11/13/2022 0619 by Chauncy Passy, RN  Outcome: Progressing  Goal: Optimal Functional Ability  11/13/2022 0619 by Chauncy Passy, RN  Outcome: Progressing  11/13/2022 0619 by Chauncy Passy, RN  Outcome: Progressing  Intervention: Optimize Functional Ability  Recent Flowsheet Documentation  Taken 11/12/2022 2050 by Chauncy Passy, RN  Activity Management: up to bedside commode  Goal: Absence of Infection Signs and Symptoms  11/13/2022 0619 by Chauncy Passy, RN  Outcome: Progressing  11/13/2022 0619 by Chauncy Passy, RN  Outcome: Progressing  Intervention: Prevent or Manage Infection  Recent Flowsheet Documentation  Taken 11/12/2022 2050 by Chauncy Passy, RN  Infection Management:  aseptic technique maintained  Goal: Improved Oral Intake  11/13/2022 0619 by Chauncy Passy, RN  Outcome: Progressing  11/13/2022 0619 by Chauncy Passy, RN  Outcome: Progressing  Goal: Optimal Pain Control and Function  11/13/2022 0619 by Chauncy Passy, RN  Outcome: Progressing  11/13/2022 0619 by Chauncy Passy, RN  Outcome: Progressing  Goal: Skin Health and Integrity  11/13/2022 [phone] by Chauncy Passy, RN  Outcome: Progressing  11/13/2022 0619 by Chauncy Passy, RN  Outcome: Progressing  Intervention: Optimize Skin Protection  Recent Flowsheet Documentation  Taken 11/12/2022 2050 by Chauncy Passy, RN  Activity Management: up to bedside commode  Pressure Reduction Techniques: frequent weight shift encouraged  Head of Bed (HOB) Positioning: HOB at 30-45 degrees  Pressure Reduction Devices: pressure-redistributing mattress utilized  Skin Protection: adhesive use limited  Goal: Optimal Wound Healing  11/13/2022 0619 by Chauncy Passy, RN  Outcome: Progressing  11/13/2022 0619 by Chauncy Passy, RN  Outcome: Progressing     Problem: Comorbidity Management  Goal: Blood Glucose Levels Within Targeted Range  11/13/2022 0619 by Chauncy Passy, RN  Outcome: Progressing  11/13/2022 0619 by Chauncy Passy, RN  Outcome: Progressing  Intervention: Monitor and Manage Glycemia  Recent Flowsheet Documentation  Taken 11/12/2022 2050 by Chauncy Passy, RN  Glycemic Management: blood glucose monitored  Goal: Blood Pressure in Desired Range  11/13/2022 0619 by Chauncy Passy, RN  Outcome: Progressing  11/13/2022 0619 by Chauncy Passy, RN  Outcome: Progressing

## 2022-11-13 NOTE — Unmapped (Cosign Needed)
Airway mallampatti: class 1    Julie Fila MD VIR PGY 6

## 2022-11-13 NOTE — Unmapped (Cosign Needed)
[facility] - Operative Note     VIR Post-Procedure Note    Procedure Name: Powerline placement    Pre-Op Diagnosis: bacteremia    Post-Op Diagnosis: Same as pre-operative diagnosis    VIR Providers  Attending: Laurell Josephs  Resident: Logan Bores    Time out: Prior to the procedure, a time out was performed with all team members present. During the time out, the patient, procedure and procedure site when applicable were verbally verified by the team members and Drs. Laurell Josephs and [employer]    Description of procedure: Successful placement of a tunneled Powerline in the right external jugular vein tunneled. The right IJV was severely stenotic proximally on ultrasound.    Sedation:Moderate sedation    Estimated Blood Loss: approximately <5 mL  Complications: None    See detailed procedure note with images in PACS.    The patient tolerated the procedure well without incident or complication and left the room in stable condition.    Everrett Coombe, MD  11/13/2022 4:52 PM

## 2022-11-13 NOTE — Unmapped (Deleted)
Physician Discharge Summary [facility]  3 [facility]  [address]  [place] [address]  Dept: [phone]  Loc: [phone]     Identifying Information:   Kelcie Banaszak  [birth year]  [id number]    Primary Care Physician: Barbette Reichmann, MD     Code Status: Full Code    Admit Date: 11/04/2022    Discharge Date: 11/13/2022     Discharge To: Home with Home Health and/or PT/OT    Discharge Service: [facility] - Nephrology Floor Team (MED B - Tower)     Discharge Attending Physician: Lyla Glassing, MD    Discharge Diagnoses:   Principal Problem:    E coli bacteremia (POA: Yes)  Active Problems:    Type 2 diabetes mellitus with diabetic nephropathy, with long-term current use of insulin (CMS-HCC) (POA: Not Applicable)    End-stage renal disease (CMS-HCC) (POA: Yes)    Hypothyroidism, acquired (POA: Yes)    Kidney transplant recipient (POA: Not Applicable)    Anemia due to stage 5 chronic kidney disease, not on chronic dialysis (CMS-HCC) (POA: Yes)    UTI (urinary tract infection) (POA: Yes)    Delirium (POA: Yes)    Immunosuppressed status (CMS-HCC) (POA: Yes)  Resolved Problems:    * No resolved hospital problems. *      Hospital Course:   Cheree Cong is a 72 y.o. 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 She returns for nausea, vomiting, and lethargy found to have UTI and E.coli bacteremia.     EBSL E. Coli Bacteremia 2/2 Urinary Source - Hx MDR UTIs - Transplanted kidney  Patient presented to the ED with 1 day of nausea, vomiting, lethargy and found to have UTI on urinalysis. She is unable to describe abdominal or flank pain due to lethargy. She was recently admitted from 3/14 - 4/3 for GNR bacteremia and finished course of meropenam (3/15 - 4/3) prior to discharge. Imaging at that time showed perinephric collection new from prior (she had prior JP drain) and she underwent IR guided fluid collection. She has recent kidney transplant as of Jan 2024. There was initially no leukocytosis, though she may not mount typical immune response given immunosuppression. She received vanc/zosyn and 2L LR. Blood cultures showed EBSL E. Coli bacteremia and her urine culture found E coli. Repeat blood cultures negative. Her antibiotics were consolidated to meropenem given previous susceptibilities. Transplant surgery was consulted and advised no current surgical recommendations. Patient has been aferbile since 2200 on 4/13. ICID was consulted. Had tunneled line placed from VIR on 4/22 and discharge on meropenem for full 14 day course through 4/27. Discussion regarding potential fosfomycin prescription for prophylaxis however this was cost prohibitive and therefore was not pursued. Further discussion regarding methenamine + vitamin C can be pursued outpatient.     Delirium (resolved) - Acute Toxic Metabolic Encephalopathy   Patient presented with lethargy and altered mental status. She remained sleepy in the mornings and difficult to wake, but improved later in the morning and has been sitting up in bed awake. Patient's mental status now back at baseline, A&O x4 asking when she can leave the hospital. She was responding appropriately and alert and oriented. Patient has had hypoactive and hyperactive delirium. Initially concerned for encephalitis/meningitis given prior fevers and mental status but given  improvement in mental status and resolution of fevers this is now a low concern. VZV pcr negative and crypto antigen negative.      Nausea/vomiting/diarrhea (improving) - SBO s/p ex-lap on recent admission  Patient is s/p exploratory laparotomy and reduction and small bowel volvulus on 10/13/22. Per husband, has been having BM and eating well until nausea and vomiting started today. Abdominal CT was reassuring for no small bowel obstruction, though she is at increased risk. It may also reflect UTI as above. Patient continued to have less diarrhea without nausea or vomiting since admission. Will continue to monitor as treating for bacteremia. Staples removed from Ex-lap on 4/18.      AKI - S/p DDKT 08/06/22   Patient follows with Dr. 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 Renal ultrasound on 4/14 found decreased size of perinephric collection around transplanted kidney as compared to previous renal US. Creatinine is increased 4/14 to 1.56 from 1.13 on 4/13 (baseline ~1). She was continued on home immunosuppression with tacrolimus and daily tac levels, prednisone, but home cellcept was held initially in the setting of ongoing infection and restarted. Prophylaxis was continued with bactrim and valcyte. VIR reconsulted for possible perinephric fluid drainage for potential source control but felt location made this extremely risky with difficulty to effectively drain. Cr stable and at baseline.      HTN  Patient was hypertensive on admission and home antihypertensives were held given bacteremia and potential to decompensate quickly from gram negative sepsis. She has remained hypertensive, but this was permissive in the setting of infection, hospitalization. Restarted home blood pressure regimen.      T2DM, insulin-dependent   Patients last A1C was 5.6 and she is on a home regimen of Lantus 6 units nightly (she takes at lunch). With good blood glucose control, home regimen was held and blood glucose remained within normal limits. Discharged on lantus 4 units with meal time insulin held.     APS report filed and pending  Per social work and transplant team, the patient's daughters are initiating guardianship application with an APS report pending. Case will be continued to be followed with transplant case manager Lowella Petties. [facility] remain involved. Patient resides with husband and daughters manage meds. Clarified with patient 4/18 with no family present at bedside that her husband Anyce Winston is her stated preference HCDM.      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 She was transfused last admission and is currently stable at above 9.4.      Recent C/f thrush, candida esophagitis  During last admission patient had episode of thrush and esophageal candidiasis that was treated with IV micafungin 3/19 and transitioned to fluconazole on 3/26-4/3. No signs on this admission.     HLD- Patient's home statin was continued.   The patient's hospital stay has been complicated by the following clinically significant conditions requiring additional evaluation and treatment or having a significant effect of this patient's care: - Malnutrition POA requiring further investigation, treatment, or monitoring  - Thrombocytopenia POA requiring further investigation or monitor  - Anemia POA requiring further investigation or monitoring  - Hypokalemia POA requiring further investigation, treatment, or monitoring     Outpatient Provider Follow Up Issues:   [ ]  Repeat Tacrolimus level on 4/24-4/25  [ ]  Urogyn referral for UTI prophylaxis  [ ]   UTI prophylaxis with methenamine + vitamin C (fosfomycin cost prohibitive)    Touchbase with Outpatient Provider:  Warm Handoff: Completed on 11/13/22 by Clotilde Dieter, MD  (Intern) via [facility] Message    Procedures:  Tunneled line placement  ______________________________________________________________________  Discharge Medications:      Your Medication List        STOP taking these medications      fluconazole 200 MG tablet  Commonly known as: DIFLUCAN            START taking these medications      estradiol 0.01 % (0.1 mg/gram) vaginal cream  Commonly known as: ESTRACE  Insert 2 g into the vagina nightly.     meropenem 1 gram/50 mL Pgbk  Commonly known as: MERREM  Infuse 50 mL (1 g total) into a venous catheter every twelve (12) hours for 8 days.            CHANGE how you take these medications      insulin glargine 100 unit/mL (3 mL) injection pen  Commonly known as: BASAGLAR, LANTUS  Inject 0.04 mL (4 Units total) under the skin nightly.  What changed: how much to take     tacrolimus 0.5 MG capsule  Commonly known as: PROGRAF  Take 2 capsules (1 mg total) by mouth daily AND 2 capsules (1 mg total) nightly.  What changed: See the new instructions.            CONTINUE taking these medications      ACCU-CHEK AVIVA PLUS TEST STRP Strp  Generic drug: blood sugar diagnostic  USE 1 STRIP TO CHECK GLUCOSE THREE TIMES DAILY     blood sugar diagnostic Strp  USE 1 STRIP TO CHECK GLUCOSE THREE TIMES DAILY     ACCU-CHEK GUIDE TEST STRIPS Strp  Generic drug: blood sugar diagnostic  Use to check blood sugar as directed with insulin 3 times a day & for symptoms of high or low blood sugar.     ACCU-CHEK SOFTCLIX LANCETS lancets  Generic drug: lancets  Use to check blood sugar as directed with insulin 3 times a day & for symptoms of high or low blood sugar.     amlodipine 5 MG tablet  Commonly known as: NORVASC  Take 1 tablet (5 mg total) by mouth every twelve (12) hours.     aspirin 81 MG tablet  Commonly known as: ECOTRIN  Take 1 tablet (81 mg total) by mouth daily.     atorvastatin 10 MG tablet  Commonly known as: LIPITOR  Take 1 tablet (10 mg total) by mouth daily.     BD ULTRA-FINE NANO PEN NEEDLE 32 gauge x 5/32 (4 mm) Ndle  Generic drug: pen needle, diabetic  Use with insulin 2 times daily     BD ULTRA-FINE NANO PEN NEEDLE 32 gauge x 5/32 (4 mm) Ndle  Generic drug: pen needle, diabetic  Use with insulin up to 4 times/day as needed.     blood-glucose meter kit  Use as directed. ACCU-CHEK AVIVA     ACCU-CHEK GUIDE GLUCOSE METER Misc  Generic drug: blood-glucose meter  Use as instructed     calcium carbonate 200 mg calcium (500 mg) chewable tablet  Commonly known as: TUMS  Chew 1 tablet (200 mg of elem calcium total) Three (3) times a day as needed.     carvedilol 25 MG tablet  Commonly known as: COREG  Take 1 tablet (25 mg total) by mouth two (2) times a  day.     chlorthalidone 25 MG tablet  Commonly known as: HYGROTON  Take 1 tablet (25 mg total) by mouth daily.     DEXCOM G7 SENSOR Devi  Generic drug: blood-glucose sensor  Use 1 sensor every 10 days.     docusate sodium 100 MG capsule  Commonly known as: COLACE  Take 1 capsule (100 mg total) by mouth two (2) times a day as needed for constipation.     hydrALAZINE 50 MG tablet  Commonly known as: APRESOLINE  Take 1 tablet (50 mg total) by mouth two (2) times a day.     levothyroxine 112 MCG tablet  Commonly known as: SYNTHROID  Take 1 tablet (112 mcg total) by mouth daily.     magnesium oxide-Mg AA chelate 133 mg  Commonly known as: Magnesium Plus Protein  Take 1 tablet by mouth two (2) times a day.     melatonin 3 mg Tab  Take 2 tablets (6 mg total) by mouth every evening.     methocarbamol 500 MG tablet  Commonly known as: ROBAXIN  Take 1 tablet (500 mg total) by mouth four (4) times a day as needed (pain/cramping).     mycophenolate 250 mg capsule  Commonly known as: CELLCEPT  Take 2 capsules (500 mg total) by mouth two (2) times a day.     NOVOLOG FLEXPEN SUBQ  Inject 0-8 Units under the skin Three (3) times a day before meals. *Max daily dose: 33 units*     pantoprazole 40 MG tablet  Commonly known as: Protonix  Take 1 tablet (40 mg total) by mouth daily.     polyethylene glycol 17 gram/dose powder  Commonly known as: GLYCOLAX  Take 17 g by mouth daily as needed.     predniSONE 5 MG tablet  Commonly known as: DELTASONE  Take 1 tablet (5 mg total) by mouth in the morning.     SENNA LAXATIVE 8.6 mg tablet  Generic drug: senna  Take 1 tablet by mouth daily as needed for constipation.     spironolactone 25 MG tablet  Commonly known as: ALDACTONE  Take 1 tablet (25 mg total) by mouth daily.     sulfamethoxazole-trimethoprim 400-80 mg per tablet  Commonly known as: BACTRIM  Take 1 tablet (80 mg of trimethoprim total) by mouth Every Monday, Wednesday, and Friday.     valGANciclovir 450 mg tablet  Commonly known as: VALCYTE  Take 1 tablet (450 mg total) by mouth daily.     VITAMIN D2-1,250 mcg (50,000 unit) 1,250 mcg (50,000 unit) capsule  Generic drug: ergocalciferol-1,250 mcg (50,000 unit)  Take 1 capsule (1,250 mcg total) by mouth once a week.              Allergies:  Oxycodone, Gabapentin, and Valacyclovir  ______________________________________________________________________  Pending Test Results:      Most Recent Labs:  All lab results last 24 hours -   Recent Results (from the past 24 hour(s))   POCT Glucose    Collection Time: 11/12/22  5:24 PM   Result Value Ref Range    Glucose, POC 240 (H) 70 - 179 mg/dL   POCT Glucose    Collection Time: 11/12/22 11:08 PM   Result Value Ref Range    Glucose, POC 237 (H) 70 - 179 mg/dL   POCT Glucose    Collection Time: 11/13/22  6:30 AM   Result Value Ref Range    Glucose, POC 166 70 - 179 mg/dL  Renal Function Panel    Collection Time: 11/13/22  7:29 AM   Result Value Ref Range    Sodium 139 135 - 145 mmol/L    Potassium 4.8 3.4 - 4.8 mmol/L    Chloride 107 98 - 107 mmol/L    CO2 23.0 20.0 - 31.0 mmol/L    Anion Gap 9 5 - 14 mmol/L    BUN 21 9 - 23 mg/dL    Creatinine 1.61 0.96 - 1.02 mg/dL    BUN/Creatinine Ratio 22     eGFR CKD-EPI (2021) Female 63 >=60 mL/min/1.68m2    Glucose 148 70 - 179 mg/dL    Calcium 8.9 8.7 - 04.5 mg/dL    Phosphorus 3.5 2.4 - 5.1 mg/dL    Albumin 3.0 (L) 3.4 - 5.0 g/dL   Magnesium Level    Collection Time: 11/13/22  7:29 AM   Result Value Ref Range    Magnesium 1.9 1.6 - 2.6 mg/dL   Tacrolimus Level, Trough    Collection Time: 11/13/22  7:29 AM   Result Value Ref Range    Tacrolimus, Trough 12.9 5.0 - 15.0 ng/mL   POCT Glucose    Collection Time: 11/13/22 11:48 AM   Result Value Ref Range Glucose, POC 137 70 - 179 mg/dL       Relevant Studies/Radiology:  XR Chest 2 views    Result Date: 11/08/2022  EXAM: XR CHEST 2 VIEWS ACCESSION: [id number] UN CLINICAL INDICATION: FEVER  TECHNIQUE: PA and Lateral Chest Radiographs. COMPARISON: Chest radiograph 11/04/2022 FINDINGS: Low lung volumes with increased bibasilar airspace disease. No pleural effusion or pneumothorax. Cardiac silhouette is unchanged in size. Thoracic aorta with calcifications. Vascular stents. Prominent loops of small bowel within the visualized central abdomen.     Low lung volumes with increased bibasilar airspace disease, may represent worsening atelectasis versus aspiration Prominent loops of small bowel within the visualized abdomen.    US Renal Transplant W Doppler    Result Date: 11/05/2022  EXAM: US RENAL Glenna Durand ACCESSION: [id number] UN CLINICAL INDICATION: 72 years old with UTI COMPARISON: 11/05/2022 CT abdomen pelvis and 10/04/2012 ultrasound TECHNIQUE:  Ultrasound views of the renal transplant were obtained using gray scale and color and spectral Doppler imaging. Views of the urinary bladder were obtained using gray scale and limited color Doppler imaging. FINDINGS: TRANSPLANTED KIDNEY: The renal transplant was located in the left lower quadrant. Normal size and echogenicity.  No solid masses or calculi. No perinephric collections identified. No hydronephrosis. Small volume perinephric fluid is identified. Previously identified perinephric collections are significantly decreased in size. VESSELS: - Perfusion: Using power Doppler, normal perfusion was seen throughout the renal parenchyma. - Resistive indices in the renal transplant and main renal artery are mildly elevated and appear mildly decreased compared with prior examination. - Main renal artery/iliac artery: Patent - Main renal vein/iliac vein: Patent BLADDER: Mildly distended limiting assessment. OTHER: Dilated fluid-filled small bowel loops are identified adjacent to the left lower quadrant transplant kidney which were better assessed on the same day CT.     Left lower quadrant transplant kidney with patent vasculature. Resistive indices in the renal transplant and main renal artery are mildly elevated and appear mildly decreased compared with prior examination. Small volume perinephric fluid is identified. Previously identified perinephric collections are significantly decreased in size. Elevated main renal artery peak systolic velocity which is nonspecific. Attention on the follow-up. Dilated fluid-filled small bowel loops are identified adjacent to the left lower quadrant transplant kidney which were better assessed on the  same day CT. Please see below for data measurements: Transplant location: LLQ Renal Transplant: Sagittal   13.2  cm; AP  7.6   cm; Transverse  6.5   cm Segmental artery superior resistive index:   0.81 Segmental artery mid resistive index:   0.81 Segmental artery inferior resistive index:   0.84 Previous resistive indices range of segmental arteries: 0.77-0.87 Main renal artery peak systolic velocity at anastomosis:   2.8  m/s Main renal artery hilum resistive index:  0.80  Main renal artery mid resistive index:   0.86 Main renal artery anastomosis resistive index:   0.90 Previous resistive indices range of main renal artery: 0.91-0.92 Main renal vein: patent Iliac artery: Patent Iliac vein: Patent Bladder volume prevoid:   55   mL Bladder volume postvoid: mL     ECG 12 Lead    Result Date: 11/05/2022  NORMAL SINUS RHYTHM NONSPECIFIC ST-T WAVE ABNORMALITIES WHEN COMPARED WITH ECG OF 21-Oct-2022 13:46, VENT. RATE HAS INCREASED BY  33 BPM Confirmed by Schuyler Amor ([id number]) on 11/05/2022 11:35:00 AM    CT Abdomen Pelvis W IV Contrast    Result Date: 11/05/2022  EXAM: CT ABDOMEN PELVIS W CONTRAST ACCESSION: [id number] UN CLINICAL INDICATION: 71 years old with Altered mental status, s/p exploratory laparotomy  COMPARISON: CT abdomen pelvis 10/12/2022, renal transplant ultrasound 10/08/2022 TECHNIQUE: A helical CT scan of the abdomen and pelvis was obtained following IV contrast from the lung bases through the pubic symphysis. Images were reconstructed in the axial plane. Coronal and sagittal reformatted images were also provided for further evaluation. FINDINGS: LOWER CHEST: Bibasilar atelectasis/scarring. Cardiomegaly. Dense coronary artery calcifications. LIVER: Normal liver contour. Similar hypoattenuating lesion in hepatic segment 5/6 (3:37). BILIARY: The gallbladder is normal in appearance. No biliary ductal dilatation.  SPLEEN: Normal in size and contour. Similar appearance of a 1.6 cm hypoattenuating lesion in the posterior spleen, likely reflecting a cyst. There is an additional subcentimeter 4 mm hypoattenuating lesion (3:35), too small to characterize. PANCREAS: Normal pancreatic contour.  No focal lesions.  No ductal dilation. ADRENAL GLANDS: Normal appearance of the adrenal glands. KIDNEYS/URETERS: Symmetric renal enhancement.  No hydronephrosis.  No solid renal mass. Atrophic bilateral native kidneys. Similar appearance of nonobstructive punctate calculi and vascular calcifications. Left lower quadrant renal transplant. Subcentimeter hypoattenuating lesions within the renal transplant, too small to characterize. No hydronephrosis. Increased inflammatory stranding surrounding the transplant kidney, particularly around the hilum. 8.3 x 0.9 x 5.5 cm crescentic fluid collection within the soft tissues lateral to the transplant kidney (3:90, 4:33). There is increased inflammatory changes and thickening involving the adjacent oblique and transversus abdominis musculature. BLADDER: Decompressed. REPRODUCTIVE ORGANS: Hysterectomy. GI TRACT: Multiple mildly prominent gas and fluid-filled loops of small bowel are noted throughout the abdomen, measuring up to 3.1 cm. A short segment loop of small bowel in the mid abdomen with circumferential wall thickening and luminal narrowing (3:78) is identified. Gas and fluid are seen extending through this point. No evidence of transition point. Normal appendix. Colonic diverticulosis. PERITONEUM, RETROPERITONEUM AND MESENTERY: No free air.  No ascites.  No fluid collection. LYMPH NODES: No adenopathy. VESSELS: Hepatic and portal veins are patent.  Normal caliber aorta. Significant burden of atherosclerotic calcifications of the abdominal aorta and its branch vessels. BONES and SOFT TISSUES: Multilevel degenerative changes of the spine. Postsurgical changes of the ventral abdominal wall. Small fat-containing ventral hernia. Sequela of subcutaneous injections within the anterior abdominal wall.     --Increased inflammatory stranding surrounding the left lower quadrant transplant kidney with  a 8.3 cm crescentic fluid collection within the overlying soft tissue. There is associated thickening and inflammatory stranding of the adjacent abdominal musculature. The findings may reflect abscess or superinfection of a previously described hematoma at this location on prior renal transplant ultrasound, however trace/no significant fluid collection was noted at this location on noncontrast CT abdomen/pelvis 10/12/2022. --Mildly prominent gas and fluid-filled loops of small bowel are noted throughout the abdomen. A focal area of bowel wall thickening and luminal narrowing is noted within the mid abdomen without evidence of complete obstruction. Findings are nonspecific and could reflect enteritis or potentially stricturing/adhesive disease at the area of bowel wall thickening in the setting of prior high-grade bowel obstruction and laparotomy. --Additional chronic and incidental findings, as detailed within the body of the report. ++++++++++++++++++++ MODIFIED REPORT: (11/05/2022 8:28 AM) This report has been modified from its preliminary version; you may check the prior versions of radiology report, results history link for prior report versions (if they were previously visible in Epic). The critical edited findings were communicated via phone call with   Dr. Francesco Sor  by Dr. Sabino Snipes on 11/05/2022 8:28 AM. -----------------------------------------------     CT Head Wo Contrast    Result Date: 11/05/2022  EXAM: Computed tomography, head or brain without contrast material. DATE: 11/05/2022 3:13 AM ACCESSION: [id number] UN DICTATED: 11/05/2022 3:21 AM INTERPRETATION LOCATION: [facility] CLINICAL INDICATION: 72 years old Female with Altered mental status  COMPARISON: None TECHNIQUE: Axial CT images of the head  from skull base to vertex without contrast. FINDINGS: There are scattered hypodense foci within the periventricular and deep white matter.  These are nonspecific but commonly associated with small vessel ischemic changes. There is no midline shift. No mass lesion. There is no evidence of acute infarct. No acute intracranial hemorrhage. No fractures are evident. The sinuses are pneumatized. Maxillary sinus mucosal retention cyst.     No acute intracranial abnormality.     XR Chest Portable    Result Date: 11/04/2022  EXAM: XR CHEST PORTABLE ACCESSION: [id number] UN CLINICAL INDICATION: ALTERED MENTAL STATUS  TECHNIQUE: Single View AP Chest Radiograph. COMPARISON: 10/07/2022 FINDINGS: Low lung volumes with bibasilar atelectasis. Bibasilar opacities. No pleural effusion or pneumothorax. Normal heart size and mediastinal contours. Vascular stent projects over the left brachiocephalic and subclavian vessels.     Bibasilar opacities which may represent edema or infection.    ______________________________________________________________________  Discharge Instructions:   Activity Instructions       Activity as tolerated                       Follow Up instructions and Outpatient Referrals     Call MD for:  difficulty breathing, headache or visual disturbances      Call MD for:  persistent nausea or vomiting      Call MD for:  severe uncontrolled pain      Call MD for:  temperature >38.5 Celsius      Discharge instructions          Appointments which have been scheduled for you      Nov 20, 2022 12:00 PM  (Arrive by 11:30 AM)  FOLLOW UP FOR A GENERAL INFECTIOUS DISEASE VISIT with Shiela Mayer, MD  [facility] TRANSPLANT INFECTIOUS DISEASES [place] [facility]) [address]  [place] [address]  [phone]        Dec 07, 2022 8:30 AM  (Arrive by 8:15 AM)  RETURN NEPHROLOGY POST with Lanora Manis  Elna Breslow, MD  [facility] KIDNEY TRANSPLANT [place] [facility] REGION) [address]  [facility] 1 through 4  [place] [address]  [phone]        Dec 07, 2022 9:00 AM  (Arrive by 8:45 AM)  NEW NUTRITION with Piedad Climes, RD/LDN  [facility] NUTRITION KIDNEY SPECIALTY TRANSPLANT [place] [facility] REGION) [address]  [facility] 1 through 4  [place] [address]  [phone]             ______________________________________________________________________  Discharge Day Services:  BP 179/56  - Pulse 64  - Temp 36.5 ??C (97.7 ??F) (Oral)  - Resp 13  - Ht 152.4 cm (5')  - Wt 56.7 kg (124 lb 14.3 oz)  - SpO2 97%  - BMI 24.39 kg/m??     Pt seen on the day of discharge and determined appropriate for discharge.    Condition at Discharge: stable    Length of Discharge: I spent greater than 30 mins in the discharge of this patient.    Clotilde Dieter, MD  [facility] Internal Medicine, PGY-1

## 2022-11-13 NOTE — Unmapped (Signed)
Nephrology ([facility]) Progress Note    Assessment & Plan:   Julie [name] is a 72 y.o. 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 She returns for nausea, vomiting, and lethargy found to have UTI and GNR bacteremia.     Principal Problem:    E coli bacteremia  Active Problems:    Type 2 diabetes mellitus with diabetic nephropathy, with long-term current use of insulin (CMS-HCC)    End-stage renal disease (CMS-HCC)    Hypothyroidism, acquired    Kidney transplant recipient    Anemia due to stage 5 chronic kidney disease, not on chronic dialysis (CMS-HCC)    UTI (urinary tract infection)    Delirium    Immunosuppressed status (CMS-HCC)  Resolved Problems:    * No resolved hospital problems. *    ESBL E. Coli Bacteremia 2/2 urinary source - Hx MDR UTIs - Transplanted kidney  Pt found to have UTI on urinalysis and GNR bacteremia on blood cx. She was recently admitted 3/14 - 4/3 for GNR bacteremia and finished course of meropenam (3/15 - 4/3) prior to discharge. Initial blood culture and urine cx positive for ESBL E coli, repeat cultures negative. No further fevers. HH would like to be present to teach patient's husband about home antibiotics.  - Anticipated discharge 4/24  - Meropenem 1g q12 (4/14-4/27)  - ICID following, appreciate recommendations  - Tunneled line placed today   - PRN tylenol 1g q8  - Referred to urogyn on recent discharge given recurrent UTI, appt has not been scheduled yet  - continue estrogen topical for UTI ppx    Delirium (resolved) - Acute Toxic Metabolic Encephalopathy  Patient's mental status now back at baseline, A&O x4 asking when she can leave the hospital.  She was responding appropriately and alert and oriented. Patient has had hypoactive and hyperactive delirium. Initially concerned for encephalitis/meningitis given prior fevers and mental status but given improvement in mental status and resolution of fevers this is now a low concern. VZV pcr negative and crypto antigen negative.   - Treat infection as above     AKI - S/p DDKT 08/06/22   Follows with Dr. Elvera Maria. 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 Renal US 4/14 found decreased size of perinephric collection around transplanted kidney as compared to previous renal US. VIR reconsulted for possible perinephric fluid drainage for potential source control but felt location made this extremely risky with difficulty to effectively drain. Cr stable and at baseline.  - Daily Renal function panel   - Home immunosuppression  - Tacrolimus 1mg  BID, daily tac levels   - Continue Prednisone 5 daily   - Cellcept increased to home 500 mg BID  - Mg replacement with 2g IV x1   - Phos replacement today   - Prophylaxis: bactrim and valcyte daily  - Cont daily aspirin       Acute on Chronic Thrombocytopenia (stable) - Anemia on CKD  Suspect in the setting of immunosuppression for kidney transplant. Was transfused last admission. Hgb currently stable at 9.4. Plts continue to decline, at 50 on 4/16 down from 144 on admission. DIC profile largely unremarkable this AM.   - Daily CBC; transfuse if Hb<7     HTN  BP increasing, closer to home readings where historically  it has been challenging to control. Persistently elevated over last 48 hours.   - Continue home spironolactone 25 mg daily, Coreg 25mg  BID, amlodipine 5mg  bid  - Continue home chlorthalidone 25mg  on 4/20  - Continue home hydral 50mg  BID      T2DM, insulin-dependent   Last A1C 5.6. Home regimen was Lantus 6 units nightly.  - Continue lantus at 4 units nightly     S/p Ex-Lap on 3/22   Exploratory laparotomy on 3/22 for volvulus during prior admission.  - Staples removed on 4/18    APS report filed and pending  Per social work and transplant team. Daughters initiating guardianship application. APS report is pending. Case will be continued to be followed with transplant case manager Lowella Petties. [place] [facility] [employer] remain involved. Pt resides with husband and daughters manage meds. Clarified with patient 4/18 with no family present at bedside that her husband Kaydense Scialdone is her stated preference HCDM.   - Appreciate CM involvement   - Current HCDM: Husband Sidney Benney - clarified 4/18    Chronic issues    Recent C/f thrush, candida esophagitis  During last admission. Immunosuppressed. IV micafungin 3/19 and transitioned to fluconazole on 3/26-4/3. No signs currently.    HLD- cont home statin    The patient's presentation is complicated by the following clinically significant conditions requiring additional evaluation and treatment: - Hypercoagulable state requiring additional attention to DVT prophylaxis and treatment  - Thrombocytopenia POA requiring further investigation or monitor  - Chronic kidney disease POA requiring further investigation, treatment, or monitoring   - Volume depletion POA requiring further investigation, treatment, or monitoring  - Complex social situation/SDOH requiring consultation and support of Care Management       Issues Impacting Complexity of Management:  -The patient is at high risk of complications from immunosuppression, antibiotic side effects, transplant complications    Checklist:  Diet: Regular  DVT PPx: Heparin 5000units q8h  Code Status: Full Code  Dispo: EDD 4/23 AM, HH arranged    Team Contact Information:   Primary Team: Nephrology (MEDB)  Primary Resident: Marcellus Scott, MD  Resident's Pager: [phone] (Nephrology Intern - Tower)    Subjective:   No acute events overnight. Tunneled line placed today.     Objective:   Physical Exam:  BP 151/51  - Pulse 68  - Temp 36.5 ??C (97.7 ??F) (Oral)  - Resp 19  - Ht 152.4 cm (5')  - Wt 56.7 kg (124 lb 14.3 oz)  - SpO2 98%  - BMI 24.39 kg/m?? Gen: NAD, well-appearing  HEENT: Atraumatic, normocephalic  CV: RRR, systolic ejection murmur at LUSB  Pulm: CTA bilaterally, symetric chest rise  Abd: Soft, NTND, well healing scar  Ext: No edema, warm and well perfused    Clotilde Dieter, MD  [facility] Internal Medicine, PGY-1

## 2022-11-14 DIAGNOSIS — D631 Anemia in chronic kidney disease: Secondary | ICD-10-CM | POA: Diagnosis not present

## 2022-11-14 DIAGNOSIS — I120 Hypertensive chronic kidney disease with stage 5 chronic kidney disease or end stage renal disease: Secondary | ICD-10-CM | POA: Diagnosis not present

## 2022-11-14 DIAGNOSIS — N186 End stage renal disease: Secondary | ICD-10-CM | POA: Diagnosis not present

## 2022-11-14 DIAGNOSIS — B9620 Unspecified Escherichia coli [E. coli] as the cause of diseases classified elsewhere: Secondary | ICD-10-CM | POA: Diagnosis not present

## 2022-11-14 DIAGNOSIS — Z48815 Encounter for surgical aftercare following surgery on the digestive system: Secondary | ICD-10-CM | POA: Diagnosis not present

## 2022-11-14 DIAGNOSIS — E1122 Type 2 diabetes mellitus with diabetic chronic kidney disease: Secondary | ICD-10-CM | POA: Diagnosis not present

## 2022-11-14 DIAGNOSIS — D849 Immunodeficiency, unspecified: Secondary | ICD-10-CM | POA: Diagnosis not present

## 2022-11-14 DIAGNOSIS — N390 Urinary tract infection, site not specified: Secondary | ICD-10-CM | POA: Diagnosis not present

## 2022-11-14 DIAGNOSIS — A419 Sepsis, unspecified organism: Secondary | ICD-10-CM | POA: Diagnosis not present

## 2022-11-14 LAB — RENAL FUNCTION PANEL
ALBUMIN: 2.8 g/dL — ABNORMAL LOW (ref 3.4–5.0)
ANION GAP: 8 mmol/L (ref 5–14)
BLOOD UREA NITROGEN: 26 mg/dL — ABNORMAL HIGH (ref 9–23)
BUN / CREAT RATIO: 22
CALCIUM: 8.3 mg/dL — ABNORMAL LOW (ref 8.7–10.4)
CHLORIDE: 110 mmol/L — ABNORMAL HIGH (ref 98–107)
CO2: 22 mmol/L (ref 20.0–31.0)
CREATININE: 1.16 mg/dL — ABNORMAL HIGH
EGFR CKD-EPI (2021) FEMALE: 50 mL/min/{1.73_m2} — ABNORMAL LOW (ref >=60)
GLUCOSE RANDOM: 160 mg/dL (ref 70–179)
PHOSPHORUS: 3.1 mg/dL (ref 2.4–5.1)
POTASSIUM: 4.7 mmol/L (ref 3.4–4.8)
SODIUM: 140 mmol/L (ref 135–145)

## 2022-11-14 LAB — TACROLIMUS LEVEL, TROUGH: TACROLIMUS, TROUGH: 6.1 ng/mL (ref 5.0–15.0)

## 2022-11-14 LAB — MAGNESIUM: MAGNESIUM: 1.7 mg/dL (ref 1.6–2.6)

## 2022-11-14 MED ADMIN — valGANciclovir (VALCYTE) tablet 450 mg: 450 mg | ORAL | @ 13:00:00 | Stop: 2022-11-14

## 2022-11-14 MED ADMIN — amlodipine (NORVASC) tablet 5 mg: 5 mg | ORAL | @ 13:00:00 | Stop: 2022-11-14

## 2022-11-14 MED ADMIN — hydrALAZINE (APRESOLINE) tablet 50 mg: 50 mg | ORAL | @ 13:00:00 | Stop: 2022-11-14

## 2022-11-14 MED ADMIN — heparin (porcine) 5,000 unit/mL injection 5,000 Units: 5000 [IU] | SUBCUTANEOUS | @ 11:00:00 | Stop: 2022-11-14

## 2022-11-14 MED ADMIN — estradiol (ESTRACE) 0.01 % (0.1 mg/gram) vaginal cream 2 g: 2 g | VAGINAL | @ 01:00:00 | Stop: 2022-11-19

## 2022-11-14 MED ADMIN — hydrALAZINE (APRESOLINE) tablet 50 mg: 50 mg | ORAL | @ 04:00:00

## 2022-11-14 MED ADMIN — mycophenolate (CELLCEPT) capsule 500 mg: 500 mg | ORAL | @ 13:00:00 | Stop: 2022-11-14

## 2022-11-14 MED ADMIN — tacrolimus (PROGRAF) capsule 1 mg: 1 mg | ORAL | @ 01:00:00

## 2022-11-14 MED ADMIN — meropenem (MERREM) 1 g in sodium chloride 0.9 % (NS) 100 mL IVPB-MBP: 1 g | INTRAVENOUS | @ 01:00:00 | Stop: 2022-11-18

## 2022-11-14 MED ADMIN — chlorthalidone (HYGROTON) tablet 25 mg: 25 mg | ORAL | @ 13:00:00 | Stop: 2022-11-14

## 2022-11-14 MED ADMIN — insulin lispro (HumaLOG) injection 0-20 Units: 0-20 [IU] | SUBCUTANEOUS | @ 11:00:00 | Stop: 2022-11-14

## 2022-11-14 MED ADMIN — predniSONE (DELTASONE) tablet 5 mg: 5 mg | ORAL | @ 13:00:00 | Stop: 2022-11-14

## 2022-11-14 MED ADMIN — carvedilol (COREG) tablet 25 mg: 25 mg | ORAL | @ 13:00:00 | Stop: 2022-11-14

## 2022-11-14 MED ADMIN — insulin glargine (LANTUS) injection 4 Units: 4 [IU] | SUBCUTANEOUS | @ 01:00:00

## 2022-11-14 MED ADMIN — atorvastatin (LIPITOR) tablet 10 mg: 10 mg | ORAL | @ 13:00:00 | Stop: 2022-11-14

## 2022-11-14 MED ADMIN — mycophenolate (CELLCEPT) capsule 500 mg: 500 mg | ORAL | @ 01:00:00

## 2022-11-14 MED ADMIN — pantoprazole (Protonix) EC tablet 40 mg: 40 mg | ORAL | @ 13:00:00 | Stop: 2022-11-14

## 2022-11-14 MED ADMIN — aspirin chewable tablet 81 mg: 81 mg | ORAL | @ 13:00:00 | Stop: 2022-11-14

## 2022-11-14 MED ADMIN — insulin lispro (HumaLOG) injection 0-20 Units: 0-20 [IU] | SUBCUTANEOUS | @ 04:00:00 | Stop: 2022-11-14

## 2022-11-14 MED ADMIN — levothyroxine (SYNTHROID) tablet 112 mcg: 112 ug | ORAL | @ 10:00:00 | Stop: 2022-11-14

## 2022-11-14 MED ADMIN — heparin (porcine) 5,000 unit/mL injection 5,000 Units: 5000 [IU] | SUBCUTANEOUS | @ 04:00:00

## 2022-11-14 MED ADMIN — spironolactone (ALDACTONE) tablet 25 mg: 25 mg | ORAL | @ 13:00:00 | Stop: 2022-11-14

## 2022-11-14 MED ADMIN — tacrolimus (PROGRAF) capsule 1 mg: 1 mg | ORAL | @ 13:00:00 | Stop: 2022-11-14

## 2022-11-14 MED ADMIN — meropenem (MERREM) 1 g in sodium chloride 0.9 % (NS) 100 mL IVPB-MBP: 1 g | INTRAVENOUS | @ 13:00:00 | Stop: 2022-11-14

## 2022-11-14 NOTE — Unmapped (Signed)
Alert and oriented, VSS on RA, denies pain. IV abx infused. BG monitored and coverage given. Tunneled line place yesterday, dressing clean, dry and intact. Up to bedside commode, free from falls and injury. Bed low and locked, call bell and side table within reach.    Problem: Adult Inpatient Plan of Care  Goal: Plan of Care Review  Outcome: Progressing  Goal: Patient-Specific Goal (Individualized)  Outcome: Progressing  Goal: Absence of Hospital-Acquired Illness or Injury  Outcome: Progressing  Intervention: Identify and Manage Fall Risk  Recent Flowsheet Documentation  Taken 11/13/2022 2050 by Chauncy Passy, RN  Safety Interventions:   fall reduction program maintained   family at bedside   lighting adjusted for tasks/safety   low bed  Intervention: Prevent Skin Injury  Recent Flowsheet Documentation  Taken 11/13/2022 2050 by Chauncy Passy, RN  Positioning for Skin: Supine/Back  Device Skin Pressure Protection: absorbent pad utilized/changed  Skin Protection: adhesive use limited  Intervention: Prevent and Manage VTE (Venous Thromboembolism) Risk  Recent Flowsheet Documentation  Taken 11/13/2022 2050 by Chauncy Passy, RN  VTE Prevention/Management: anticoagulant therapy  Goal: Optimal Comfort and Wellbeing  Outcome: Progressing  Goal: Readiness for Transition of Care  Outcome: Progressing  Goal: Rounds/Family Conference  Outcome: Progressing     Problem: Fall Injury Risk  Goal: Absence of Fall and Fall-Related Injury  Outcome: Progressing  Intervention: Promote Injury-Free Environment  Recent Flowsheet Documentation  Taken 11/13/2022 2050 by Chauncy Passy, RN  Safety Interventions:   fall reduction program maintained   family at bedside   lighting adjusted for tasks/safety   low bed     Problem: Self-Care Deficit  Goal: Improved Ability to Complete Activities of Daily Living  Outcome: Progressing     Problem: Infection  Goal: Absence of Infection Signs and Symptoms  Outcome: Progressing  Intervention: Prevent or Manage Infection  Recent Flowsheet Documentation  Taken 11/13/2022 2050 by Chauncy Passy, RN  Infection Management: aseptic technique maintained     Problem: Wound  Goal: Optimal Coping  Outcome: Progressing  Goal: Optimal Functional Ability  Outcome: Progressing  Intervention: Optimize Functional Ability  Recent Flowsheet Documentation  Taken 11/13/2022 2050 by Chauncy Passy, RN  Activity Management: up to bedside commode  Goal: Absence of Infection Signs and Symptoms  Outcome: Progressing  Intervention: Prevent or Manage Infection  Recent Flowsheet Documentation  Taken 11/13/2022 2050 by Chauncy Passy, RN  Infection Management: aseptic technique maintained  Goal: Improved Oral Intake  Outcome: Progressing  Goal: Optimal Pain Control and Function  Outcome: Progressing  Goal: Skin Health and Integrity  Outcome: Progressing  Intervention: Optimize Skin Protection  Recent Flowsheet Documentation  Taken 11/13/2022 2050 by Chauncy Passy, RN  Activity Management: up to bedside commode  Pressure Reduction Techniques: frequent weight shift encouraged  Head of Bed (HOB) Positioning: HOB at 30-45 degrees  Pressure Reduction Devices: pressure-redistributing mattress utilized  Skin Protection: adhesive use limited  Goal: Optimal Wound Healing  Outcome: Progressing     Problem: Comorbidity Management  Goal: Blood Glucose Levels Within Targeted Range  Outcome: Progressing  Intervention: Monitor and Manage Glycemia  Recent Flowsheet Documentation  Taken 11/13/2022 2050 by Chauncy Passy, RN  Glycemic Management: blood glucose monitored  Goal: Blood Pressure in Desired Range  Outcome: Progressing

## 2022-11-14 NOTE — Unmapped (Signed)
Physician Discharge Summary [facility]  3 [facility]  [address]  [place] [address]  Dept: [phone]  Loc: [phone]     Identifying Information:   Keeanna Villafranca  [birth year]  [id number]    Primary Care Physician: Barbette Reichmann, MD     Code Status: Full Code    Admit Date: 11/04/2022    Discharge Date: 11/14/2022     Discharge To: Home with Home Health and/or PT/OT    Discharge Service: [facility] - Nephrology Floor Team (MED B - Tower)     Discharge Attending Physician: Lyla Glassing, MD    Discharge Diagnoses:   Principal Problem:    E coli bacteremia (POA: Yes)  Active Problems:    Type 2 diabetes mellitus with diabetic nephropathy, with long-term current use of insulin (CMS-HCC) (POA: Not Applicable)    End-stage renal disease (CMS-HCC) (POA: Yes)    Hypothyroidism, acquired (POA: Yes)    Kidney transplant recipient (POA: Not Applicable)    Anemia due to stage 5 chronic kidney disease, not on chronic dialysis (CMS-HCC) (POA: Yes)    UTI (urinary tract infection) (POA: Yes)    Delirium (POA: Yes)    Immunosuppressed status (CMS-HCC) (POA: Yes)  Resolved Problems:    * No resolved hospital problems. *      Hospital Course:   Lannah Koike is a 72 y.o. 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 She returns for nausea, vomiting, and lethargy found to have UTI and E.coli bacteremia.     EBSL E. Coli Bacteremia 2/2 Urinary Source - Hx MDR UTIs - Transplanted kidney  Patient presented to the ED with 1 day of nausea, vomiting, lethargy and found to have UTI on urinalysis. She is unable to describe abdominal or flank pain due to lethargy. She was recently admitted from 3/14 - 4/3 for GNR bacteremia and finished course of meropenam (3/15 - 4/3) prior to discharge. Imaging at that time showed perinephric collection new from prior (she had prior JP drain) and she underwent IR guided fluid collection. She has recent kidney transplant as of Jan 2024. There was initially no leukocytosis, though she may not mount typical immune response given immunosuppression. She received vanc/zosyn and 2L LR. Blood cultures showed EBSL E. Coli bacteremia and her urine culture found E coli. Repeat blood cultures negative. Her antibiotics were consolidated to meropenem given previous susceptibilities. Transplant surgery was consulted and advised no current surgical recommendations. Patient has been aferbile since 2200 on 4/13. ICID was consulted. Had tunneled line placed from VIR on 4/22 and discharge on meropenem for full 14 day course through 4/27. Discussion regarding potential fosfomycin prescription for prophylaxis however this was cost prohibitive and therefore was not pursued. Further discussion regarding methenamine + vitamin C can be pursued outpatient.     Delirium (resolved) - Acute Toxic Metabolic Encephalopathy   Patient presented with lethargy and altered mental status. She remained sleepy in the mornings and difficult to wake, but improved later in the morning and has been sitting up in bed awake. Patient's mental status now back at baseline, A&O x4 asking when she can leave the hospital. She was responding appropriately and alert and oriented. Patient has had hypoactive and hyperactive delirium. Initially concerned for encephalitis/meningitis given prior fevers and mental status but given  improvement in mental status and resolution of fevers this is now a low concern. VZV pcr negative and crypto antigen negative.      Nausea/vomiting/diarrhea (improving) - SBO s/p ex-lap on recent admission  Patient is s/p exploratory laparotomy and reduction and small bowel volvulus on 10/13/22. Per husband, has been having BM and eating well until nausea and vomiting started today. Abdominal CT was reassuring for no small bowel obstruction, though she is at increased risk. It may also reflect UTI as above. Patient continued to have less diarrhea without nausea or vomiting since admission. Will continue to monitor as treating for bacteremia. Staples removed from Ex-lap on 4/18.      AKI - S/p DDKT 08/06/22   Patient follows with Dr. 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 Renal ultrasound on 4/14 found decreased size of perinephric collection around transplanted kidney as compared to previous renal US. Creatinine is increased 4/14 to 1.56 from 1.13 on 4/13 (baseline ~1). She was continued on home immunosuppression with tacrolimus and daily tac levels, prednisone, but home cellcept was held initially in the setting of ongoing infection and restarted. Prophylaxis was continued with bactrim and valcyte. VIR reconsulted for possible perinephric fluid drainage for potential source control but felt location made this extremely risky with difficulty to effectively drain. Cr stable and at baseline.      HTN  Patient was hypertensive on admission and home antihypertensives were held given bacteremia and potential to decompensate quickly from gram negative sepsis. She has remained hypertensive, but this was permissive in the setting of infection, hospitalization. Restarted home blood pressure regimen.      T2DM, insulin-dependent   Patients last A1C was 5.6 and she is on a home regimen of Lantus 6 units nightly (she takes at lunch). With good blood glucose control, home regimen was held and blood glucose remained within normal limits. Discharged on lantus 4 units with meal time insulin held.     APS report filed and pending  Per social work and transplant team, the patient's daughters are initiating guardianship application with an APS report pending. Case will be continued to be followed with transplant case manager Lowella Petties. [facility] [employer] [facility] remain involved. Patient resides with husband and daughters manage meds. Clarified with patient 4/18 with no family present at bedside that her husband Dinesha Twiggs is her stated preference HCDM.      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 She was transfused last admission and is currently stable at above 9.4.      Recent C/f thrush, candida esophagitis  During last admission patient had episode of thrush and esophageal candidiasis that was treated with IV micafungin 3/19 and transitioned to fluconazole on 3/26-4/3. No signs on this admission.     HLD- Patient's home statin was continued.   The patient's hospital stay has been complicated by the following clinically significant conditions requiring additional evaluation and treatment or having a significant effect of this patient's care: - Malnutrition POA requiring further investigation, treatment, or monitoring  - Thrombocytopenia POA requiring further investigation or monitor  - Anemia POA requiring further investigation or monitoring  - Hypokalemia POA requiring further investigation, treatment, or monitoring     Outpatient Provider Follow Up Issues:   [ ]  Repeat Tacrolimus level on 4/24-4/25  [ ]  Urogyn referral for UTI prophylaxis  [ ]   UTI prophylaxis with methenamine + vitamin C (fosfomycin cost prohibitive)    Touchbase with Outpatient Provider:  Warm Handoff: Completed on 11/14/22 by Leighton Ruff, MD  (Intern) via [facility] Message    Procedures:  Tunneled line placement  ______________________________________________________________________  Discharge Medications:      Your Medication List        STOP taking these medications      fluconazole 200 MG tablet  Commonly known as: DIFLUCAN            START taking these medications      estradiol 0.01 % (0.1 mg/gram) vaginal cream  Commonly known as: ESTRACE  Insert 2 g into the vagina nightly.     meropenem 1 gram/50 mL Pgbk  Commonly known as: MERREM  Infuse 50 mL (1 g total) into a venous catheter every twelve (12) hours for 8 days.            CHANGE how you take these medications      insulin glargine 100 unit/mL (3 mL) injection pen  Commonly known as: BASAGLAR, LANTUS  Inject 0.04 mL (4 Units total) under the skin nightly.  What changed: how much to take     tacrolimus 0.5 MG capsule  Commonly known as: PROGRAF  Take 2 capsules (1 mg total) by mouth daily AND 2 capsules (1 mg total) nightly.  What changed: See the new instructions.            CONTINUE taking these medications      ACCU-CHEK AVIVA PLUS TEST STRP Strp  Generic drug: blood sugar diagnostic  USE 1 STRIP TO CHECK GLUCOSE THREE TIMES DAILY     blood sugar diagnostic Strp  USE 1 STRIP TO CHECK GLUCOSE THREE TIMES DAILY     ACCU-CHEK GUIDE TEST STRIPS Strp  Generic drug: blood sugar diagnostic  Use to check blood sugar as directed with insulin 3 times a day & for symptoms of high or low blood sugar.     ACCU-CHEK SOFTCLIX LANCETS lancets  Generic drug: lancets  Use to check blood sugar as directed with insulin 3 times a day & for symptoms of high or low blood sugar.     amlodipine 5 MG tablet  Commonly known as: NORVASC  Take 1 tablet (5 mg total) by mouth every twelve (12) hours.     aspirin 81 MG tablet  Commonly known as: ECOTRIN  Take 1 tablet (81 mg total) by mouth daily.     atorvastatin 10 MG tablet  Commonly known as: LIPITOR  Take 1 tablet (10 mg total) by mouth daily.     BD ULTRA-FINE NANO PEN NEEDLE 32 gauge x 5/32 (4 mm) Ndle  Generic drug: pen needle, diabetic  Use with insulin 2 times daily     BD ULTRA-FINE NANO PEN NEEDLE 32 gauge x 5/32 (4 mm) Ndle  Generic drug: pen needle, diabetic  Use with insulin up to 4 times/day as needed.     blood-glucose meter kit  Use as directed. ACCU-CHEK AVIVA     ACCU-CHEK GUIDE GLUCOSE METER Misc  Generic drug: blood-glucose meter  Use as instructed     calcium carbonate 200 mg calcium (500 mg) chewable tablet  Commonly known as: TUMS  Chew 1 tablet (200 mg of elem calcium total) Three (3) times a day as needed.     carvedilol 25 MG tablet  Commonly known as: COREG  Take 1 tablet (25 mg total) by mouth two (2) times a  day.     chlorthalidone 25 MG tablet  Commonly known as: HYGROTON  Take 1 tablet (25 mg total) by mouth daily.     DEXCOM G7 SENSOR Devi  Generic drug: blood-glucose sensor  Use 1 sensor every 10 days.     docusate sodium 100 MG capsule  Commonly known as: COLACE  Take 1 capsule (100 mg total) by mouth two (2) times a day as needed for constipation.     hydrALAZINE 50 MG tablet  Commonly known as: APRESOLINE  Take 1 tablet (50 mg total) by mouth two (2) times a day.     levothyroxine 112 MCG tablet  Commonly known as: SYNTHROID  Take 1 tablet (112 mcg total) by mouth daily.     magnesium oxide-Mg AA chelate 133 mg  Commonly known as: Magnesium Plus Protein  Take 1 tablet by mouth two (2) times a day.     melatonin 3 mg Tab  Take 2 tablets (6 mg total) by mouth every evening.     methocarbamol 500 MG tablet  Commonly known as: ROBAXIN  Take 1 tablet (500 mg total) by mouth four (4) times a day as needed (pain/cramping).     mycophenolate 250 mg capsule  Commonly known as: CELLCEPT  Take 2 capsules (500 mg total) by mouth two (2) times a day.     NOVOLOG FLEXPEN SUBQ  Inject 0-8 Units under the skin Three (3) times a day before meals. *Max daily dose: 33 units*     pantoprazole 40 MG tablet  Commonly known as: Protonix  Take 1 tablet (40 mg total) by mouth daily.     polyethylene glycol 17 gram/dose powder  Commonly known as: GLYCOLAX  Take 17 g by mouth daily as needed.     predniSONE 5 MG tablet  Commonly known as: DELTASONE  Take 1 tablet (5 mg total) by mouth in the morning.     SENNA LAXATIVE 8.6 mg tablet  Generic drug: senna  Take 1 tablet by mouth daily as needed for constipation.     spironolactone 25 MG tablet  Commonly known as: ALDACTONE  Take 1 tablet (25 mg total) by mouth daily.     sulfamethoxazole-trimethoprim 400-80 mg per tablet  Commonly known as: BACTRIM  Take 1 tablet (80 mg of trimethoprim total) by mouth Every Monday, Wednesday, and Friday.     valGANciclovir 450 mg tablet  Commonly known as: VALCYTE  Take 1 tablet (450 mg total) by mouth daily.     VITAMIN D2-1,250 mcg (50,000 unit) 1,250 mcg (50,000 unit) capsule  Generic drug: ergocalciferol-1,250 mcg (50,000 unit)  Take 1 capsule (1,250 mcg total) by mouth once a week.              Allergies:  Oxycodone, Gabapentin, and Valacyclovir  ______________________________________________________________________  Pending Test Results:      Most Recent Labs:  All lab results last 24 hours -   Recent Results (from the past 24 hour(s))   POCT Glucose    Collection Time: 11/13/22 11:48 AM   Result Value Ref Range    Glucose, POC 137 70 - 179 mg/dL   POCT Glucose    Collection Time: 11/13/22  5:42 PM   Result Value Ref Range    Glucose, POC 194 (H) 70 - 179 mg/dL   POCT Glucose    Collection Time: 11/13/22 11:46 PM   Result Value Ref Range    Glucose, POC 211 (H) 70 - 179 mg/dL   Magnesium  Level    Collection Time: 11/14/22  6:27 AM   Result Value Ref Range    Magnesium 1.7 1.6 - 2.6 mg/dL   Renal Function Panel    Collection Time: 11/14/22  6:27 AM   Result Value Ref Range    Sodium 140 135 - 145 mmol/L    Potassium 4.7 3.4 - 4.8 mmol/L    Chloride 110 (H) 98 - 107 mmol/L    CO2 22.0 20.0 - 31.0 mmol/L    Anion Gap 8 5 - 14 mmol/L    BUN 26 (H) 9 - 23 mg/dL    Creatinine 9.56 (H) 0.55 - 1.02 mg/dL    BUN/Creatinine Ratio 22     eGFR CKD-EPI (2021) Female 50 (L) >=60 mL/min/1.79m2    Glucose 160 70 - 179 mg/dL    Calcium 8.3 (L) 8.7 - 10.4 mg/dL    Phosphorus 3.1 2.4 - 5.1 mg/dL    Albumin 2.8 (L) 3.4 - 5.0 g/dL   Tacrolimus Level, Trough    Collection Time: 11/14/22  6:27 AM   Result Value Ref Range    Tacrolimus, Trough 6.1 5.0 - 15.0 ng/mL   POCT Glucose    Collection Time: 11/14/22  6:28 AM   Result Value Ref Range    Glucose, POC 161 70 - 179 mg/dL       Relevant Studies/Radiology:  XR Chest 2 views    Result Date: 11/08/2022  EXAM: XR CHEST 2 VIEWS ACCESSION: [id number] UN CLINICAL INDICATION: FEVER  TECHNIQUE: PA and Lateral Chest Radiographs. COMPARISON: Chest radiograph 11/04/2022 FINDINGS: Low lung volumes with increased bibasilar airspace disease. No pleural effusion or pneumothorax. Cardiac silhouette is unchanged in size. Thoracic aorta with calcifications. Vascular stents. Prominent loops of small bowel within the visualized central abdomen.     Low lung volumes with increased bibasilar airspace disease, may represent worsening atelectasis versus aspiration Prominent loops of small bowel within the visualized abdomen.    US Renal Transplant W Doppler    Result Date: 11/05/2022  EXAM: US RENAL Glenna Durand ACCESSION: [id number] UN CLINICAL INDICATION: 72 years old with UTI COMPARISON: 11/05/2022 CT abdomen pelvis and 10/04/2012 ultrasound TECHNIQUE:  Ultrasound views of the renal transplant were obtained using gray scale and color and spectral Doppler imaging. Views of the urinary bladder were obtained using gray scale and limited color Doppler imaging. FINDINGS: TRANSPLANTED KIDNEY: The renal transplant was located in the left lower quadrant. Normal size and echogenicity.  No solid masses or calculi. No perinephric collections identified. No hydronephrosis. Small volume perinephric fluid is identified. Previously identified perinephric collections are significantly decreased in size. VESSELS: - Perfusion: Using power Doppler, normal perfusion was seen throughout the renal parenchyma. - Resistive indices in the renal transplant and main renal artery are mildly elevated and appear mildly decreased compared with prior examination. - Main renal artery/iliac artery: Patent - Main renal vein/iliac vein: Patent BLADDER: Mildly distended limiting assessment. OTHER: Dilated fluid-filled small bowel loops are identified adjacent to the left lower quadrant transplant kidney which were better assessed on the same day CT.     Left lower quadrant transplant kidney with patent vasculature. Resistive indices in the renal transplant and main renal artery are mildly elevated and appear mildly decreased compared with prior examination. Small volume perinephric fluid is identified. Previously identified perinephric collections are significantly decreased in size. Elevated main renal artery peak systolic velocity which is nonspecific. Attention on the follow-up. Dilated fluid-filled small bowel loops are identified adjacent to the left lower quadrant  transplant kidney which were better assessed on the same day CT. Please see below for data measurements: Transplant location: LLQ Renal Transplant: Sagittal   13.2  cm; AP  7.6   cm; Transverse  6.5   cm Segmental artery superior resistive index:   0.81 Segmental artery mid resistive index:   0.81 Segmental artery inferior resistive index:   0.84 Previous resistive indices range of segmental arteries: 0.77-0.87 Main renal artery peak systolic velocity at anastomosis:   2.8  m/s Main renal artery hilum resistive index:  0.80  Main renal artery mid resistive index:   0.86 Main renal artery anastomosis resistive index:   0.90 Previous resistive indices range of main renal artery: 0.91-0.92 Main renal vein: patent Iliac artery: Patent Iliac vein: Patent Bladder volume prevoid:   55   mL Bladder volume postvoid: mL     ECG 12 Lead    Result Date: 11/05/2022  NORMAL SINUS RHYTHM NONSPECIFIC ST-T WAVE ABNORMALITIES WHEN COMPARED WITH ECG OF 21-Oct-2022 13:46, VENT. RATE HAS INCREASED BY  33 BPM Confirmed by Schuyler Amor ([id number]) on 11/05/2022 11:35:00 AM    CT Abdomen Pelvis W IV Contrast    Result Date: 11/05/2022  EXAM: CT ABDOMEN PELVIS W CONTRAST ACCESSION: [id number] UN CLINICAL INDICATION: 72 years old with Altered mental status, s/p exploratory laparotomy  COMPARISON: CT abdomen pelvis 10/12/2022, renal transplant ultrasound 10/08/2022 TECHNIQUE: A helical CT scan of the abdomen and pelvis was obtained following IV contrast from the lung bases through the pubic symphysis. Images were reconstructed in the axial plane. Coronal and sagittal reformatted images were also provided for further evaluation. FINDINGS: LOWER CHEST: Bibasilar atelectasis/scarring. Cardiomegaly. Dense coronary artery calcifications. LIVER: Normal liver contour. Similar hypoattenuating lesion in hepatic segment 5/6 (3:37). BILIARY: The gallbladder is normal in appearance. No biliary ductal dilatation.  SPLEEN: Normal in size and contour. Similar appearance of a 1.6 cm hypoattenuating lesion in the posterior spleen, likely reflecting a cyst. There is an additional subcentimeter 4 mm hypoattenuating lesion (3:35), too small to characterize. PANCREAS: Normal pancreatic contour.  No focal lesions.  No ductal dilation. ADRENAL GLANDS: Normal appearance of the adrenal glands. KIDNEYS/URETERS: Symmetric renal enhancement.  No hydronephrosis.  No solid renal mass. Atrophic bilateral native kidneys. Similar appearance of nonobstructive punctate calculi and vascular calcifications. Left lower quadrant renal transplant. Subcentimeter hypoattenuating lesions within the renal transplant, too small to characterize. No hydronephrosis. Increased inflammatory stranding surrounding the transplant kidney, particularly around the hilum. 8.3 x 0.9 x 5.5 cm crescentic fluid collection within the soft tissues lateral to the transplant kidney (3:90, 4:33). There is increased inflammatory changes and thickening involving the adjacent oblique and transversus abdominis musculature. BLADDER: Decompressed. REPRODUCTIVE ORGANS: Hysterectomy. GI TRACT: Multiple mildly prominent gas and fluid-filled loops of small bowel are noted throughout the abdomen, measuring up to 3.1 cm. A short segment loop of small bowel in the mid abdomen with circumferential wall thickening and luminal narrowing (3:78) is identified. Gas and fluid are seen extending through this point. No evidence of transition point. Normal appendix. Colonic diverticulosis. PERITONEUM, RETROPERITONEUM AND MESENTERY: No free air.  No ascites.  No fluid collection. LYMPH NODES: No adenopathy. VESSELS: Hepatic and portal veins are patent.  Normal caliber aorta. Significant burden of atherosclerotic calcifications of the abdominal aorta and its branch vessels. BONES and SOFT TISSUES: Multilevel degenerative changes of the spine. Postsurgical changes of the ventral abdominal wall. Small fat-containing ventral hernia. Sequela of subcutaneous injections within the anterior abdominal wall.     --Increased inflammatory stranding  surrounding the left lower quadrant transplant kidney with a 8.3 cm crescentic fluid collection within the overlying soft tissue. There is associated thickening and inflammatory stranding of the adjacent abdominal musculature. The findings may reflect abscess or superinfection of a previously described hematoma at this location on prior renal transplant ultrasound, however trace/no significant fluid collection was noted at this location on noncontrast CT abdomen/pelvis 10/12/2022. --Mildly prominent gas and fluid-filled loops of small bowel are noted throughout the abdomen. A focal area of bowel wall thickening and luminal narrowing is noted within the mid abdomen without evidence of complete obstruction. Findings are nonspecific and could reflect enteritis or potentially stricturing/adhesive disease at the area of bowel wall thickening in the setting of prior high-grade bowel obstruction and laparotomy. --Additional chronic and incidental findings, as detailed within the body of the report. ++++++++++++++++++++ MODIFIED REPORT: (11/05/2022 8:28 AM) This report has been modified from its preliminary version; you may check the prior versions of radiology report, results history link for prior report versions (if they were previously visible in Epic). The critical edited findings were communicated via phone call with   Dr. Francesco Sor  by Dr. Sabino Snipes on 11/05/2022 8:28 AM. -----------------------------------------------     CT Head Wo Contrast    Result Date: 11/05/2022  EXAM: Computed tomography, head or brain without contrast material. DATE: 11/05/2022 3:13 AM ACCESSION: [id number] UN DICTATED: 11/05/2022 3:21 AM INTERPRETATION LOCATION: [facility] CLINICAL INDICATION: 72 years old Female with Altered mental status  COMPARISON: None TECHNIQUE: Axial CT images of the head  from skull base to vertex without contrast. FINDINGS: There are scattered hypodense foci within the periventricular and deep white matter.  These are nonspecific but commonly associated with small vessel ischemic changes. There is no midline shift. No mass lesion. There is no evidence of acute infarct. No acute intracranial hemorrhage. No fractures are evident. The sinuses are pneumatized. Maxillary sinus mucosal retention cyst.     No acute intracranial abnormality.     XR Chest Portable    Result Date: 11/04/2022  EXAM: XR CHEST PORTABLE ACCESSION: [id number] UN CLINICAL INDICATION: ALTERED MENTAL STATUS  TECHNIQUE: Single View AP Chest Radiograph. COMPARISON: 10/07/2022 FINDINGS: Low lung volumes with bibasilar atelectasis. Bibasilar opacities. No pleural effusion or pneumothorax. Normal heart size and mediastinal contours. Vascular stent projects over the left brachiocephalic and subclavian vessels.     Bibasilar opacities which may represent edema or infection.    ______________________________________________________________________  Discharge Instructions:   Activity Instructions       Activity as tolerated                       Follow Up instructions and Outpatient Referrals     Referral to Home Infusion      Performing location?: External    Home Health Requested Disciplines:  Nursing  Physical Therapy  Occupational Therapy  Home Health Aide        **Please contact your service pharmacist for assistance with discharge   home health infusion monitoring.      Call MD for:  difficulty breathing, headache or visual disturbances      Call MD for:  persistent nausea or vomiting      Call MD for:  severe uncontrolled pain      Call MD for:  temperature >38.5 Celsius      Discharge instructions          Appointments which have been scheduled for you      Nov 20, 2022 12:00 PM  (Arrive by 11:30 AM)  FOLLOW UP FOR A GENERAL INFECTIOUS DISEASE VISIT with Shiela Mayer, MD  [facility] TRANSPLANT INFECTIOUS DISEASES [place] [facility] REGION) [address]  [place] [address]  [phone]        Dec 07, 2022 8:30 AM  (Arrive by 8:15 AM)  RETURN NEPHROLOGY POST with Leafy Half, MD  [facility] KIDNEY TRANSPLANT [place] [facility] REGION) [address]  [facility] 1 through 4  [place] [address]  [phone]        Dec 07, 2022 9:00 AM  (Arrive by 8:45 AM)  NEW NUTRITION with Piedad Climes, RD/LDN  [facility] NUTRITION KIDNEY SPECIALTY [place] [name] REGION) [address]  [facility] 1 through 4  [place] [address]  [phone]      Additional instructions:    11/21/2022 10:00 AM EDT Office Visit [facility]   [address]   [place], [place] [address]   [phone]  Alan Mulder, MD   [address]   [place], [place] [address]   [phone] (Work)             ______________________________________________________________________  Discharge Day Services:  BP 177/66  - Pulse 67  - Temp 36.6 ??C (97.9 ??F) (Oral)  - Resp 16  - Ht 152.4 cm (5')  - Wt 56.7 kg (124 lb 14.3 oz)  - SpO2 100%  - BMI 24.39 kg/m??     Pt seen on the day of discharge and determined appropriate for discharge.    Condition at Discharge: stable    Length of Discharge: I spent greater than 30 mins in the discharge of this patient.

## 2022-11-14 NOTE — Unmapped (Signed)
11/21/2022 10:00 AM EDT Office Visit [facility]   [address]   [place], [place] [address]   [phone]  Alan Mulder, MD   [address]   [place], [place] [address]   [phone] (Work)

## 2022-11-14 NOTE — Unmapped (Addendum)
Diabetes education consultation: Consulted for assistance with providing instruction on diabetes self-management skills. Visit with Julie [name] and husband at the bedside. Provided 15 minutes teaching diabetes education.     Reason for consult: Assistance putting dexcom on, please   Consult orders reviewed with: Julie [name] and husband     Assessment: Julie [name] admitted with insulin dependent, hypothyroidism, recent admission 3/14-4/3 for GNR bacteremia also underwent exploratory laparotomy and reduction and small bowel volvulus on 10/13/22, now had N/V and UTI.  See MAR for medication list.      A1C:    Lab Results   Component Value Date    A1C 5.6 08/06/2022    EAG 114 08/06/2022         Insulin administration & safety: Discussed discharge regimen, briefly. Husband said wife will do her insulin shots at home and they have a good understanding of the plan. Shared with Julie [name] clinical nurse will review final diabetes discharge plan. Instructed on insulin administration with       Insurance: Tammi Hartvigsen has [employer] GOLD [employer]US HMO. There are not financial barriers to glucose monitoring.     Monitoring: Provided Julie [name] and husband instruction on how to apply the Dexcom G7 sensor.  Shared instructional paper insert from the box with husband and he could use as a resource for the next application. Husband demonstrated understanding by applying sensor to arm.  Dexcom G7 set up for use.     Glucose Monitoring Supplies needing prescriptions at discharge:  Dexcom G7 10 day Flash Glucose Sensor kit x 2 (month supply) with 11 refills       Recommendations: At f/u visit with PCP request referral for DSMES (Diabetes Self Management Education and Support) after discharge.     Plan:Please reconsult as needed.  Thank You,   Remonia Richter, BSN, RN, [employer], Certified Diabetes Care & Education Specialist  - pager: [phone]

## 2022-11-15 DIAGNOSIS — Z940 Kidney transplant status: Secondary | ICD-10-CM | POA: Diagnosis not present

## 2022-11-15 LAB — RENAL FUNCTION PANEL
ALBUMIN: 3.5 g/dL — ABNORMAL LOW (ref 3.8–4.8)
BLOOD UREA NITROGEN: 28 mg/dL — ABNORMAL HIGH (ref 8–27)
BUN / CREAT RATIO: 23 (ref 12–28)
CALCIUM: 9.1 mg/dL (ref 8.7–10.3)
CHLORIDE: 105 mmol/L (ref 96–106)
CO2: 20 mmol/L (ref 20–29)
CREATININE: 1.23 mg/dL — ABNORMAL HIGH (ref 0.57–1.00)
EGFR: 46 mL/min/{1.73_m2} — ABNORMAL LOW
GLUCOSE: 159 mg/dL — ABNORMAL HIGH (ref 70–99)
PHOSPHORUS, SERUM: 3.3 mg/dL (ref 3.0–4.3)
POTASSIUM: 5.4 mmol/L — ABNORMAL HIGH (ref 3.5–5.2)
SODIUM: 137 mmol/L (ref 134–144)

## 2022-11-15 LAB — MAGNESIUM: MAGNESIUM: 2 mg/dL (ref 1.6–2.3)

## 2022-11-16 DIAGNOSIS — N186 End stage renal disease: Secondary | ICD-10-CM | POA: Diagnosis not present

## 2022-11-16 DIAGNOSIS — Z48815 Encounter for surgical aftercare following surgery on the digestive system: Secondary | ICD-10-CM | POA: Diagnosis not present

## 2022-11-16 DIAGNOSIS — D631 Anemia in chronic kidney disease: Secondary | ICD-10-CM | POA: Diagnosis not present

## 2022-11-16 DIAGNOSIS — B9620 Unspecified Escherichia coli [E. coli] as the cause of diseases classified elsewhere: Secondary | ICD-10-CM | POA: Diagnosis not present

## 2022-11-16 DIAGNOSIS — E1122 Type 2 diabetes mellitus with diabetic chronic kidney disease: Secondary | ICD-10-CM | POA: Diagnosis not present

## 2022-11-16 DIAGNOSIS — A419 Sepsis, unspecified organism: Secondary | ICD-10-CM | POA: Diagnosis not present

## 2022-11-16 DIAGNOSIS — D849 Immunodeficiency, unspecified: Secondary | ICD-10-CM | POA: Diagnosis not present

## 2022-11-16 DIAGNOSIS — N390 Urinary tract infection, site not specified: Secondary | ICD-10-CM | POA: Diagnosis not present

## 2022-11-16 DIAGNOSIS — I120 Hypertensive chronic kidney disease with stage 5 chronic kidney disease or end stage renal disease: Secondary | ICD-10-CM | POA: Diagnosis not present

## 2022-11-16 LAB — CBC W/ DIFFERENTIAL
BANDED NEUTROPHILS ABSOLUTE COUNT: 0.2 10*3/uL — ABNORMAL HIGH (ref 0.0–0.1)
BASOPHILS ABSOLUTE COUNT: 0 10*3/uL (ref 0.0–0.2)
BASOPHILS RELATIVE PERCENT: 1 %
EOSINOPHILS ABSOLUTE COUNT: 0 10*3/uL (ref 0.0–0.4)
EOSINOPHILS RELATIVE PERCENT: 0 %
HEMATOCRIT: 26 % — ABNORMAL LOW (ref 34.0–46.6)
HEMOGLOBIN: 8.3 g/dL — ABNORMAL LOW (ref 11.1–15.9)
IMMATURE GRANULOCYTES: 3 %
LYMPHOCYTES ABSOLUTE COUNT: 0.7 10*3/uL (ref 0.7–3.1)
LYMPHOCYTES RELATIVE PERCENT: 11 %
MEAN CORPUSCULAR HEMOGLOBIN CONC: 31.9 g/dL (ref 31.5–35.7)
MEAN CORPUSCULAR HEMOGLOBIN: 31.9 pg (ref 26.6–33.0)
MEAN CORPUSCULAR VOLUME: 100 fL — ABNORMAL HIGH (ref 79–97)
MONOCYTES ABSOLUTE COUNT: 0.3 10*3/uL (ref 0.1–0.9)
MONOCYTES RELATIVE PERCENT: 4 %
NEUTROPHILS ABSOLUTE COUNT: 5.3 10*3/uL (ref 1.4–7.0)
NEUTROPHILS RELATIVE PERCENT: 81 %
PLATELET COUNT: 219 10*3/uL (ref 150–450)
RED BLOOD CELL COUNT: 2.6 x10E6/uL — CL (ref 3.77–5.28)
RED CELL DISTRIBUTION WIDTH: 14 % (ref 11.7–15.4)
WHITE BLOOD CELL COUNT: 6.5 10*3/uL (ref 3.4–10.8)

## 2022-11-16 NOTE — Unmapped (Signed)
Received a message from patient spouse with questions about insulin regimen after discharge.  Spouse reports she was only told to take lantus 4 units and hold meal  time insulin.  He reports they did not check BG levels with meals yesterday but her dexcom was reading up to 300.  BG thsi am was 138.  Pt is eating well since being home.  Reviewed with Knox Saliva PharmD, pt advised to increase lantus to 8 units and re-start novolog 4 units with meals plus SSI.  Chase Picket reports he has the med action list from prior to recent admission and has updated with new doses.

## 2022-11-17 DIAGNOSIS — I5022 Chronic systolic (congestive) heart failure: Secondary | ICD-10-CM | POA: Diagnosis not present

## 2022-11-17 DIAGNOSIS — Z13820 Encounter for screening for osteoporosis: Secondary | ICD-10-CM | POA: Diagnosis not present

## 2022-11-17 DIAGNOSIS — D649 Anemia, unspecified: Secondary | ICD-10-CM | POA: Diagnosis not present

## 2022-11-17 DIAGNOSIS — E1142 Type 2 diabetes mellitus with diabetic polyneuropathy: Secondary | ICD-10-CM | POA: Diagnosis not present

## 2022-11-17 DIAGNOSIS — I110 Hypertensive heart disease with heart failure: Secondary | ICD-10-CM | POA: Diagnosis not present

## 2022-11-17 DIAGNOSIS — Z09 Encounter for follow-up examination after completed treatment for conditions other than malignant neoplasm: Secondary | ICD-10-CM | POA: Diagnosis not present

## 2022-11-17 DIAGNOSIS — E039 Hypothyroidism, unspecified: Secondary | ICD-10-CM | POA: Diagnosis not present

## 2022-11-17 DIAGNOSIS — E875 Hyperkalemia: Secondary | ICD-10-CM | POA: Diagnosis not present

## 2022-11-17 DIAGNOSIS — Z940 Kidney transplant status: Secondary | ICD-10-CM | POA: Diagnosis not present

## 2022-11-17 LAB — TACROLIMUS LEVEL: TACROLIMUS BLOOD: 13.9 ng/mL (ref 2.0–20.0)

## 2022-11-19 NOTE — Unmapped (Addendum)
IMMUNOCOMPROMISED HOST INFECTIOUS DISEASE PROGRESS NOTE  Assessment/Plan:     Julie [name] is a 72 y.o. female who presents for hospital discharge follow up.     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 Had perinephric drain in place postoperatively, removed 08/30/22  - Immunosuppression: Tacrolimus (goal 6-8), MMF 500 BID (decreased to 250mg  BID during hospitalization), Pred 5  - Prophylaxis: valganciclovir x 3 months (mod risk), TMP+SMX x 6 months  Estimated Creatinine Clearance: 32 mL/min (A) (based on SCr of 1.23 mg/dL (H)).      Pertinent comorbidities:  ESRD was on PD and HD  DM II (A1c 5.6 on 08/06/22)  CAD  HLD  SBO 10/07/22 s/p ex lap 10/13/22 w/ lysis of adhesions     Pertinent exposures:  Originally from [place]   Treated with ivermectin at the ID clinic in 2018     Summary of pertinent prior infections:  #History of Shingles 2017  #04/2018 Dialysis fluid Serratia R: Ampicillin, Unasyn, Cefazolin,S: Ceftriaxone, Gentamicin, Levo, PipTazo, Tobra  #Thrush w/presumed candidal esophagitis 10/10/22 s/p 14 day treatment fluconazole  #Donor urine cx with C. Dubliensis 08/05/22, <10K, negative recipient samples, negative donor blood cx, treated with 2 weeks PO fluconazole  # Recurrent E.Coli Bacteremia/UTI  - 1/29 BCx/UCx E. Coli - s/p Levo x 3 weeks (thru stent removal, 3/15) (S: Cefazolin, Cipro/Levo, Pip-tazo, Cephalexin; R: TMP-SMX, Amp, Amp-sulb)  - 3/14 BCx/UCx ESBL/FQ-R E. 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 Coli (S: Cefepime SDD, Erta/Mero, [name], Tobra; I: Ceftaz, R: Amp, Atreonam, Cefazolin, Ceftriaxone, Cipro/Levo); UCx 10-50K E. Coli (S: Cefepime SDD, Erta/Mero, gent, nitrofurantoin, tobra; I: Ceftaz; R: Amp, Aztreonam, Cefazolin, Ceftriaxone, Cephalexin, Cipro/Levo, tetra, TMP-SMX)   - 4/14 BCx 1/1: (NGTD); serum Crypto Neg  - 4/15 BCx (NGTD), serum crypto neg  - 4/16 serum VZV neg, BCx NG  Rx: 4/13 Vanc/Pip-tazo--> 4/14 Vancomycin/Meropenem ---> 4/15 Vanc/Mero/IV acyclovir--> 4/17 Vanc/Mero->4/18 meropenem ->4/27 stop     # Perinephric fluid collection 10/05/22, re-accumulated after JP drain removal 08/30/22- low suspicion for infection at this time   - 3/19 IR guided fluid collection drainage: bacterial and AFB cultures negative, Cr 1.5 (not suggestive of urinoma)  - 4/13: Transplanted renal U/S with perinephric fluid - decreased in size from prior   - 4/13 CT Ab/P with IV contrast - increased inflammatory stranding surrounding the LLQ transplanted kidney with crescentric fluid collection. Mildly prominent gas and fluid filled loops of small bowel.   - 4/14 Evaluation by transplant surgery - no surgical indication and they signed off.    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 Of note, patient was never evaluated by Uro/Gyn during the hospitalization.     Management  In the interim, will start p.o nitrofurantoin 100mg  daily for prophylaxis atleast until she is seen by Uro/Gyn (CrCl is greater than 30).   Will discontinue the power line (order placed). We held off reaching out to [facility] [phone] for line removal today since patient and her husband prefer waiting for VIR to reach out for scheduling appointment at [facility].     Antimicrobial prophylaxis required for transplant immunosuppression   Patient s/p completion of valcyte 3 months.  Continue TMP+SMX renally dosed equiv of 400/80 po MWF x 6 months.    Intensive toxicity monitoring for prescription antimicrobials   Given hepB core+, needs LFTs q83m for first year, HBSAg and HBV DNA q74m for first year and with any rise in LFTs  clinical assessments for rashes or other skin changes    Follow up 6 weeks.          I personally spent 65 minutes face-to-face and non-face-to-face in the care of this patient, which includes all pre, intra, and post visit time on the date of service.  All documented time was specific to the E/M visit and does not include any procedures that may have been performed.     Recommendations were communicated via shared medical record.  Pt d/w Dr. [name].   Yehuda Budd, MD  [facility] Division of Infectious Diseases    Subjective     External record(s):  patient recently admitted 10/05/22- 10/25/22 for E.coli bacteremia, underwent ex-lap and reduction and small bowel volvulus on 10/13/22. Re-admitted to [facility] 4/14-4/23/24 with c/o N/V , E.coli bacteremia iso UTI .    Independent historian(s): no independent historian required.       Interval History:   Reports feeling well. Denies any fevers, chills, N/V/D, abdominal pain, dysuria, pain or redness or warmth over the graft.   Has not seen Uro/Gyn yet.     Medications:    Current Outpatient Medications:     amlodipine (NORVASC) 5 MG tablet, Take 1 tablet (5 mg total) by mouth every twelve (12) hours., Disp: 90 tablet, Rfl: 3    aspirin (ECOTRIN) 81 MG tablet, Take 1 tablet (81 mg total) by mouth daily., Disp: 90 tablet, Rfl: 3    atorvastatin (LIPITOR) 10 MG tablet, Take 1 tablet (10 mg total) by mouth daily., Disp: 90 tablet, Rfl: 3    blood sugar diagnostic (ACCU-CHEK AVIVA PLUS TEST STRP) Strp, USE 1 STRIP TO CHECK GLUCOSE THREE TIMES DAILY, Disp: , Rfl:     blood sugar diagnostic (GLUCOSE BLOOD) Strp, Use to check blood sugar as directed with insulin 3 times a day & for symptoms of high or low blood sugar., Disp: 100 strip, Rfl: 11    blood sugar diagnostic Strp, USE 1 STRIP TO CHECK GLUCOSE THREE TIMES DAILY, Disp: , Rfl:     blood-glucose meter kit, Use as directed. ACCU-CHEK AVIVA, Disp: , Rfl:     blood-glucose meter kit, Use as instructed, Disp: 1 each, Rfl: 0    blood-glucose sensor (DEXCOM G7 SENSOR) Devi, Use 1 sensor every 10 days., Disp: 3 each, Rfl: 11    calcium carbonate (TUMS) 200 mg calcium (500 mg)  chewable tablet, Chew 1 tablet (200 mg of elem calcium total) Three (3) times a day as needed., Disp: 90 tablet, Rfl: 0    carvedilol (COREG) 25 MG tablet, Take 1 tablet (25 mg total) by mouth two (2) times a day., Disp: 180 tablet, Rfl: 3    chlorthalidone (HYGROTON) 25 MG tablet, Take 1 tablet (25 mg total) by mouth daily., Disp: 90 tablet, Rfl: 3    docusate sodium (COLACE) 100 MG capsule, Take 1 capsule (100 mg total) by mouth two (2) times a day as needed for constipation., Disp: 60 capsule, Rfl: 11    ergocalciferol-1,250 mcg, 50,000 unit, (VITAMIN D2-1,250 MCG, 50,000 UNIT,) 1,250 mcg (50,000 unit) capsule, Take 1 capsule (1,250 mcg total) by mouth once a week., Disp: , Rfl:     estradiol (ESTRACE) 0.01 % (0.1 mg/gram) vaginal cream, Insert 2 g into the vagina nightly., Disp: 60 g, Rfl: 11    hydrALAZINE (APRESOLINE) 50 MG tablet, Take 1 tablet (50 mg total) by mouth two (2) times a day., Disp: 60 tablet, Rfl: 0    insulin aspart (NOVOLOG FLEXPEN SUBQ), Inject 0-8 Units under the skin Three (3) times a day before meals. *Max daily dose: 33 units*, Disp: , Rfl:     insulin glargine (BASAGLAR, LANTUS) 100 unit/mL (3 mL) injection pen, Inject 0.08 mL (8 Units total) under the skin nightly., Disp: 2.4 mL, Rfl: 0    lancets Misc, Use to check blood sugar as directed with insulin 3 times a day & for symptoms of high or low blood sugar., Disp: 100 each, Rfl: 0    levothyroxine (SYNTHROID) 112 MCG tablet, Take 1 tablet (112 mcg total) by mouth daily., Disp: 90 tablet, Rfl: 3    magnesium oxide-Mg AA chelate (MAGNESIUM PLUS PROTEIN) 133 mg, Take 1 tablet by mouth two (2) times a day., Disp: , Rfl:     melatonin 3 mg Tab, Take 2 tablets (6 mg total) by mouth every evening., Disp: 30 tablet, Rfl: 0    methocarbamol (ROBAXIN) 500 MG tablet, Take 1 tablet (500 mg total) by mouth four (4) times a day as needed (pain/cramping)., Disp: 30 tablet, Rfl: 0    mycophenolate (CELLCEPT) 250 mg capsule, Take 2 capsules (500 mg total) by mouth two (2) times a day., Disp: 360 capsule, Rfl: 3    pantoprazole (PROTONIX) 40 MG tablet, Take 1 tablet (40 mg total) by mouth daily., Disp: 90 tablet, Rfl: 3    pen needle, diabetic 32 gauge x 5/32 (4 mm) Ndle, Use with insulin up to 4 times/day as needed., Disp: 100 each, Rfl: 11    pen needle, diabetic 32 gauge x 5/32 Ndle, Use with insulin 2 times daily, Disp: , Rfl:     polyethylene glycol (GLYCOLAX) 17 gram/dose powder, Take 17 g by mouth daily as needed., Disp: 510 g, Rfl: 11    predniSONE (DELTASONE) 5 MG tablet, Take 1 tablet (5 mg total) by mouth in the morning., Disp: 90 tablet, Rfl: 3    SENNA 8.6 mg tablet, Take 1 tablet by mouth daily as needed for constipation., Disp: 30 tablet, Rfl: 11    spironolactone (ALDACTONE) 25 MG tablet, Take 1 tablet (25 mg total) by mouth daily., Disp: , Rfl:     sulfamethoxazole-trimethoprim (BACTRIM) 400-80 mg per tablet, Take 1 tablet (80 mg of trimethoprim total) by mouth Every Monday, Wednesday, and Friday., Disp: 36 tablet, Rfl: 1    tacrolimus (PROGRAF) 0.5 MG capsule, Take 2 capsules (1 mg total)  by mouth daily AND 1 capsule (0.5 mg total) nightly., Disp: 180 capsule, Rfl: 0    valGANciclovir (VALCYTE) 450 mg tablet, Take 1 tablet (450 mg total) by mouth daily., Disp: 90 tablet, Rfl: 0    Objective     Vital signs:  BP 136/39  - Pulse 65  - Temp 36.1 ??C (96.9 ??F) (Tympanic)  - Ht 152.4 cm (5')  - Wt 56.1 kg (123 lb 9.6 oz)  - BMI 24.14 kg/m??     Physical Exam:  Const [x]  vital signs above    [x]  NAD, non-toxic appearance []  Chronically ill-appearing, non-distressed  Ambulating in wheelchair      Eyes [x]  Lids normal bilaterally, conjunctiva anicteric and noninjected OU     [] PERRL  [] EOMI        ENMT [x]  Normal appearance of external nose and ears, no nasal discharge        [x]  MMM, no lesions on lips or gums [x]  No thrush, leukoplakia, oral lesions  []  Dentition good []  Edentulous []  Dental caries present  []  Hearing normal  []  TMs with good light reflexes bilaterally         Neck []  Neck of normal appearance and trachea midline        []  No thyromegaly, nodules, or tenderness   []  Full neck ROM        Lymph []  No LAD in neck     []  No LAD in supraclavicular area     []  No LAD in axillae   []  No LAD in epitrochlear chains     []  No LAD in inguinal areas        CV [x]  RRR            [x]  No peripheral edema     []  Pedal pulses intact   [x]  No abnormal heart sounds appreciated   []  Extremities WWP         Resp [x]  Normal WOB at rest    [x]  No breathlessness with speaking, no coughing  [x]  CTA anteriorly    [x]  CTA posteriorly          GI [x]  Normal inspection, NTND   [x]  NABS     []  No umbilical hernia on exam       []  No hepatosplenomegaly     []  Inspection of perineal and perianal areas normal  Old well healed surgical scar; no pain, warmth or redness over the renal graft.       GU []  Normal external genitalia     [x] No urinary catheter present in urethra   []  No CVA tenderness    [x]  No tenderness over renal allograft        MSK []  No clubbing or cyanosis of hands       []  No vertebral point tenderness  [x]  No focal tenderness or abnormalities on palpation of joints in RUE, LUE, RLE, or LLE        Skin [x]  No rashes, lesions, or ulcers of visualized skin     [x]  Skin warm and dry to palpation         Neuro [x]  Face expression symmetric  []  Sensation to light touch grossly intact throughout    []  Moves extremities equally    []  No tremor noted        []  CNs II-XII grossly intact     []  DTRs normal and symmetric throughout []  Gait unremarkable        Psych [x]   Appropriate affect       [x]  Fluent speech         [x]  Attentive, good eye contact  [x]  Oriented to person, place, time          [x]  Judgment and insight are appropriate           Data for Medical Decision Making     I discussed mgm't w/qualified health care professional(s) involved in case: renal tx team re recs .    I reviewed CBC results (wbc is wnl), chemistry results (Scr is elevated 1.23; LFTs WNL), and micro result(s) (blood cx NG).    I independently visualized/interpreted not done.       Results in Past 30 Days  Result Component Current Result Ref Range Previous Result Ref Range   Absolute Eosinophils 0.0 (11/15/2022) 0.0 - 0.4 x10E3/uL 0.0 (11/08/2022) 0.0 - 0.5 10*9/L   Absolute Lymphocytes 0.7 (11/15/2022) 0.7 - 3.1 x10E3/uL 0.2 (L) (11/08/2022) 1.1 - 3.6 10*9/L   Absolute Neutrophils 5.3 (11/15/2022) 1.4 - 7.0 x10E3/uL 2.5 (11/08/2022) 1.8 - 7.8 10*9/L   Alkaline Phosphatase 89 (11/04/2022) 46 - 116 U/L Not in Time Range     90 (11/04/2022) 46 - 116 U/L     ALT 31 (11/04/2022) 10 - 49 U/L Not in Time Range     32 (11/04/2022) 10 - 49 U/L     AST 34 (11/04/2022) <=34 U/L Not in Time Range     31 (11/04/2022) <=34 U/L     BUN 28 (H) (11/15/2022) 8 - 27 mg/dL 26 (H) (4/54/0981) 9 - 23 mg/dL   Calcium 9.1 (1/91/4782) 8.7 - 10.3 mg/dL 8.3 (L) (9/56/2130) 8.7 - 10.4 mg/dL   Creatinine 8.65 (H) (11/15/2022) 0.57 - 1.00 mg/dL 7.84 (H) (6/96/2952) 8.41 - 1.02 mg/dL   HGB 8.3 (L) (10/14/4008) 11.1 - 15.9 g/dL 7.9 (L) (2/72/5366) 44.0 - 14.9 g/dL   Magnesium 2.0 (3/47/4259) 1.6 - 2.3 mg/dL 1.7 (5/63/8756) 1.6 - 2.6 mg/dL   Phosphorus 3.1 (4/33/2951) 2.4 - 5.1 mg/dL 3.5 (8/84/1660) 2.4 - 5.1 mg/dL   Platelet 630 (1/60/1093) 150 - 450 x10E3/uL 166 (11/12/2022) 150 - 450 10*9/L   Potassium 5.4 (H) (11/15/2022) 3.5 - 5.2 mmol/L 4.7 (11/14/2022) 3.4 - 4.8 mmol/L   Total Bilirubin 0.3 (11/04/2022) 0.3 - 1.2 mg/dL Not in Time Range     0.3 (11/04/2022) 0.3 - 1.2 mg/dL     WBC 6.5 (2/35/5732) 3.4 - 10.8 x10E3/uL 3.3 (L) (11/12/2022) 3.6 - 11.2 10*9/L       Microbiology:  11/07/22 blood cx NGTD    Imaging:  11/07/22  Impression      Low lung volumes with increased bibasilar airspace disease, may represent worsening atelectasis versus aspiration      Prominent loops of small bowel within the visualized abdomen.     Narrative   EXAM: XR CHEST 2 VIEWS   ACCESSION: [id number]      CLINICAL INDICATION: FEVER        TECHNIQUE: PA and Lateral Chest Radiographs.      COMPARISON: Chest radiograph 11/04/2022      FINDINGS:      Low lung volumes with increased bibasilar airspace disease. No pleural effusion or pneumothorax.      Cardiac silhouette is unchanged in size. Thoracic aorta with calcifications. Vascular stents. Prominent loops of small bowel within the visualized central abdomen.       Additional Studies:   (11/05/22) EKG QTcF 

## 2022-11-20 ENCOUNTER — Ambulatory Visit: Admit: 2022-11-20 | Discharge: 2022-11-21 | Payer: MEDICARE

## 2022-11-20 DIAGNOSIS — N186 End stage renal disease: Secondary | ICD-10-CM | POA: Diagnosis not present

## 2022-11-20 DIAGNOSIS — B9620 Unspecified Escherichia coli [E. coli] as the cause of diseases classified elsewhere: Secondary | ICD-10-CM | POA: Diagnosis not present

## 2022-11-20 DIAGNOSIS — Z940 Kidney transplant status: Secondary | ICD-10-CM | POA: Diagnosis not present

## 2022-11-20 DIAGNOSIS — A419 Sepsis, unspecified organism: Secondary | ICD-10-CM | POA: Diagnosis not present

## 2022-11-20 DIAGNOSIS — I120 Hypertensive chronic kidney disease with stage 5 chronic kidney disease or end stage renal disease: Secondary | ICD-10-CM | POA: Diagnosis not present

## 2022-11-20 DIAGNOSIS — D849 Immunodeficiency, unspecified: Secondary | ICD-10-CM | POA: Diagnosis not present

## 2022-11-20 DIAGNOSIS — N390 Urinary tract infection, site not specified: Secondary | ICD-10-CM | POA: Diagnosis not present

## 2022-11-20 DIAGNOSIS — D631 Anemia in chronic kidney disease: Secondary | ICD-10-CM | POA: Diagnosis not present

## 2022-11-20 DIAGNOSIS — E1122 Type 2 diabetes mellitus with diabetic chronic kidney disease: Secondary | ICD-10-CM | POA: Diagnosis not present

## 2022-11-20 DIAGNOSIS — Z792 Long term (current) use of antibiotics: Secondary | ICD-10-CM | POA: Diagnosis not present

## 2022-11-20 DIAGNOSIS — Z48815 Encounter for surgical aftercare following surgery on the digestive system: Secondary | ICD-10-CM | POA: Diagnosis not present

## 2022-11-20 DIAGNOSIS — R7881 Bacteremia: Principal | ICD-10-CM

## 2022-11-20 MED ORDER — NITROFURANTOIN MONOHYDRATE/MACROCRYSTALS 100 MG CAPSULE
ORAL_CAPSULE | Freq: Every day | ORAL | 12 refills | 30 days | Status: CP
Start: 2022-11-20 — End: ?

## 2022-11-20 MED ORDER — INSULIN GLARGINE (U-100) 100 UNIT/ML (3 ML) SUBCUTANEOUS PEN
Freq: Every evening | SUBCUTANEOUS | 0 refills | 30 days | Status: CP
Start: 2022-11-20 — End: 2022-12-20

## 2022-11-20 MED ORDER — TACROLIMUS 0.5 MG CAPSULE, IMMEDIATE-RELEASE
ORAL_CAPSULE | ORAL | 0 refills | 60 days | Status: CP
Start: 2022-11-20 — End: ?

## 2022-11-20 NOTE — Unmapped (Signed)
Reviewed recent labs and tac level 13.9 with Dr. Elvera Maria, called pts daughter Julie [name] to advised to decrease tacrolimus to 1 mg in the morning and 0.5 mg in the evening.  Detailed VM left of the above and for [facility] to call with questions.

## 2022-11-22 MED ORDER — HYDRALAZINE 50 MG TABLET
ORAL_TABLET | Freq: Two times a day (BID) | ORAL | 3 refills | 90 days | Status: CP
Start: 2022-11-22 — End: 2023-11-22

## 2022-11-24 ENCOUNTER — Ambulatory Visit: Admit: 2022-11-24 | Discharge: 2022-11-25 | Payer: MEDICARE | Attending: Family | Primary: Family

## 2022-11-24 DIAGNOSIS — Z452 Encounter for adjustment and management of vascular access device: Secondary | ICD-10-CM | POA: Diagnosis not present

## 2022-11-24 DIAGNOSIS — Z881 Allergy status to other antibiotic agents status: Secondary | ICD-10-CM | POA: Diagnosis not present

## 2022-11-24 DIAGNOSIS — N390 Urinary tract infection, site not specified: Secondary | ICD-10-CM | POA: Diagnosis not present

## 2022-11-24 DIAGNOSIS — Z885 Allergy status to narcotic agent status: Secondary | ICD-10-CM | POA: Diagnosis not present

## 2022-11-24 DIAGNOSIS — B9620 Unspecified Escherichia coli [E. coli] as the cause of diseases classified elsewhere: Secondary | ICD-10-CM | POA: Diagnosis not present

## 2022-11-24 DIAGNOSIS — Z792 Long term (current) use of antibiotics: Secondary | ICD-10-CM | POA: Diagnosis not present

## 2022-11-24 DIAGNOSIS — R7881 Bacteremia: Secondary | ICD-10-CM | POA: Diagnosis not present

## 2022-11-24 DIAGNOSIS — Z889 Allergy status to unspecified drugs, medicaments and biological substances status: Secondary | ICD-10-CM | POA: Diagnosis not present

## 2022-11-24 NOTE — Unmapped (Signed)
Patient came in to clinic for a scheduled tunneled line removal today with APP. Pt had completed course of IV antibiotics on Sunday. VSS. Alert and oriented x 4, afebrile. Dressing removed, exit site looked clean and dry, line intact. Line removed by APP without complications/issues and patient tolerated procedure well. Good hemostasis obtained after applying manual pressure. Occlusive dressing placed with triple antibiotics ointment to site. AVS printed and has discussed instructions with both the patient and her husband and has verbalized understanding. Home supplies provided for dressing changes. Clinically stable on leaving the VIR clinic.

## 2022-11-24 NOTE — Unmapped (Signed)
Patient Name: Julie [name]  Patient Age: 72 y.o.  Encounter Date: 11/24/22   APP Interventional Radiology: Alease Frame, NP    Referring Physician: Shiela Mayer, MD  [address]  [facility]  [place],  [place] [address]  Primary Care Provider: Barbette Reichmann, MD      SUBJECTIVE      Reason for Visit: Tunneled central venous catheter removal    History of Present Illness: Julie [name] is a 72 y.o. female with history DDKT with recurrent E.coli bacteremia/UTI s/p antibiotic therapy . Patient has a tunneled PowerLine (single-lumen) that was placed on 11/13/22 for antibiotic administration. she no longer requires the line and presents for removal.    Allergies:  Allergies   Allergen Reactions    Oxycodone Nausea And Vomiting     Per Pt cannot tolerate oxycodone    Gabapentin Other (See Comments)     Shakes    Valacyclovir Nausea Only and Other (See Comments)        OBJECTIVE     Physical Exam:  General: NAD  Respiratory: Breathing even and non labored  Neuro: Alert and oriented x 4    ASSESSMENT/PLAN     Julie [name] is a 72 y.o. female who presents for removal of tunneled central venous catheter.  --Prior to the procedure, a time out was performed. During the time out, the patient, procedure, allergies and procedure site when applicable were verbally verified by the team members and Alease Frame, NP.   --tunneled PowerLine (single-lumen) removed intact without issue & sterile dressing applied  --No further IR follow up required    Alease Frame, NP  11/24/22 10:35 AM

## 2022-11-24 NOTE — Unmapped (Signed)
After your line removal:  Avoid strenuous activity and heavy lifting (greater than 10 pounds) for 1 week.  Do not submerge site until healed. No tub baths, swimming pools until healed.    Dressing stays intact for 2 days.  After two days, remove dressing and gently wash site.   Dry and apply new clean dressing.   Wash site and apply new dressing daily until healed.  If steri-strips are present, let them fall off on their own.    You may return to your usual diet unless otherwise directed.   Please call [facility] at [phone] and choose option 3 to speak to a nurse for any problems or questions following discharge including:    Bleeding that saturates bandage  Pain at site not controlled by regular pain medicines  Signs of infection at site: redness, swelling, pus draining, fever above 101.5    If site bleeds, sit upright and hold pressure to site.   For rapid, uncontrolled bleeding hold firm pressure and seek emergency medical attention.     Calls received during normal business hours (Monday through Friday from 8:00 am to 4:30 pm) will be returned within one business day. Calls received after normal business hours OR on weekends will be returned during the next business day.     If you are experiencing a medical emergency, please call 911 or go to your nearest emergency room.

## 2022-11-26 ENCOUNTER — Emergency Department: Payer: Medicare HMO

## 2022-11-26 ENCOUNTER — Other Ambulatory Visit: Payer: Self-pay

## 2022-11-26 ENCOUNTER — Emergency Department
Admission: EM | Admit: 2022-11-26 | Discharge: 2022-11-26 | Disposition: A | Discharge status: Home / Self Care | Payer: Medicare HMO | Attending: Emergency Medicine | Admitting: Emergency Medicine

## 2022-11-26 DIAGNOSIS — M5416 Radiculopathy, lumbar region: Secondary | ICD-10-CM | POA: Diagnosis not present

## 2022-11-26 DIAGNOSIS — M25552 Pain in left hip: Secondary | ICD-10-CM

## 2022-11-26 DIAGNOSIS — M48061 Spinal stenosis, lumbar region without neurogenic claudication: Secondary | ICD-10-CM | POA: Diagnosis not present

## 2022-11-26 DIAGNOSIS — K5730 Diverticulosis of large intestine without perforation or abscess without bleeding: Secondary | ICD-10-CM | POA: Diagnosis not present

## 2022-11-26 DIAGNOSIS — M5136 Other intervertebral disc degeneration, lumbar region: Secondary | ICD-10-CM | POA: Diagnosis not present

## 2022-11-26 DIAGNOSIS — M5442 Lumbago with sciatica, left side: Secondary | ICD-10-CM | POA: Diagnosis not present

## 2022-11-26 DIAGNOSIS — E119 Type 2 diabetes mellitus without complications: Secondary | ICD-10-CM | POA: Insufficient documentation

## 2022-11-26 DIAGNOSIS — M5440 Lumbago with sciatica, unspecified side: Secondary | ICD-10-CM

## 2022-11-26 DIAGNOSIS — Z8619 Personal history of other infectious and parasitic diseases: Secondary | ICD-10-CM

## 2022-11-26 DIAGNOSIS — I10 Essential (primary) hypertension: Secondary | ICD-10-CM | POA: Insufficient documentation

## 2022-11-26 DIAGNOSIS — M545 Low back pain, unspecified: Secondary | ICD-10-CM | POA: Diagnosis present

## 2022-11-26 LAB — CBC WITH DIFFERENTIAL/PLATELET
Abs Immature Granulocytes: 0.14 10*3/uL — ABNORMAL HIGH (ref 0.00–0.07)
Basophils Absolute: 0 10*3/uL (ref 0.0–0.1)
Basophils Relative: 1 %
Eosinophils Absolute: 0 10*3/uL (ref 0.0–0.5)
Eosinophils Relative: 0 %
HCT: 30.9 % — ABNORMAL LOW (ref 36.0–46.0)
Hemoglobin: 10.1 g/dL — ABNORMAL LOW (ref 12.0–15.0)
Immature Granulocytes: 4 %
Lymphocytes Relative: 14 %
Lymphs Abs: 0.5 10*3/uL — ABNORMAL LOW (ref 0.7–4.0)
MCH: 32.8 pg (ref 26.0–34.0)
MCHC: 32.7 g/dL (ref 30.0–36.0)
MCV: 100.3 fL — ABNORMAL HIGH (ref 80.0–100.0)
Monocytes Absolute: 0.1 10*3/uL (ref 0.1–1.0)
Monocytes Relative: 3 %
Neutro Abs: 3 10*3/uL (ref 1.7–7.7)
Neutrophils Relative %: 78 %
Platelets: 95 10*3/uL — ABNORMAL LOW (ref 150–400)
RBC: 3.08 MIL/uL — ABNORMAL LOW (ref 3.87–5.11)
RDW: 14.3 % (ref 11.5–15.5)
WBC: 3.8 10*3/uL — ABNORMAL LOW (ref 4.0–10.5)
nRBC: 0 % (ref 0.0–0.2)

## 2022-11-26 LAB — COMPREHENSIVE METABOLIC PANEL
ALT: 10 U/L (ref 0–44)
AST: 15 U/L (ref 15–41)
Albumin: 4.2 g/dL (ref 3.5–5.0)
Alkaline Phosphatase: 65 U/L (ref 38–126)
Anion gap: 6 (ref 5–15)
BUN: 51 mg/dL — ABNORMAL HIGH (ref 8–23)
CO2: 19 mmol/L — ABNORMAL LOW (ref 22–32)
Calcium: 9.2 mg/dL (ref 8.9–10.3)
Chloride: 112 mmol/L — ABNORMAL HIGH (ref 98–111)
Creatinine, Ser: 1.15 mg/dL — ABNORMAL HIGH (ref 0.44–1.00)
GFR, Estimated: 50 mL/min — ABNORMAL LOW (ref >60)
Glucose, Bld: 164 mg/dL — ABNORMAL HIGH (ref 70–99)
Potassium: 5 mmol/L (ref 3.5–5.1)
Sodium: 137 mmol/L (ref 135–145)
Total Bilirubin: 0.8 mg/dL (ref 0.3–1.2)
Total Protein: 6.7 g/dL (ref 6.5–8.1)

## 2022-11-26 LAB — URINALYSIS, ROUTINE W REFLEX MICROSCOPIC
Bilirubin Urine: NEGATIVE
Glucose, UA: NEGATIVE mg/dL
Hgb urine dipstick: NEGATIVE
Ketones, ur: NEGATIVE mg/dL
Leukocytes,Ua: NEGATIVE
Nitrite: NEGATIVE
Protein, ur: NEGATIVE mg/dL
Specific Gravity, Urine: 1.011 (ref 1.005–1.030)
pH: 6 (ref 5.0–8.0)

## 2022-11-26 LAB — SEDIMENTATION RATE: Sed Rate: 21 mm/hr (ref 0–30)

## 2022-11-26 MED ORDER — HYDROCODONE-ACETAMINOPHEN 5-325 MG PO TABS: 1.0000 | ORAL_TABLET | Freq: Four times a day (QID) | ORAL | 0 refills | Status: DC | PRN

## 2022-11-26 MED ORDER — MORPHINE SULFATE (PF) 4 MG/ML IV SOLN
4.0000 mg | Freq: Once | INTRAVENOUS | Status: AC
  Administered 2022-11-26: 4 mg via INTRAVENOUS
  Filled 2022-11-26: qty 1

## 2022-11-26 MED ORDER — ONDANSETRON HCL 4 MG/2ML IJ SOLN
4.0000 mg | Freq: Once | INTRAMUSCULAR | Status: AC
  Administered 2022-11-26: 4 mg via INTRAVENOUS
  Filled 2022-11-26: qty 2

## 2022-11-26 MED ORDER — ONDANSETRON 4 MG PO TBDP: 4.0000 mg | ORAL_TABLET | Freq: Three times a day (TID) | ORAL | 0 refills | Status: AC | PRN

## 2022-11-26 NOTE — ED Triage Notes (Signed)
Pt c/o left hip pain x3 days. Pt denies any injury to this area.

## 2022-11-26 NOTE — Discharge Instructions (Addendum)
Follow-up with your regular doctor if not improving in 3 days.  Return if worsening.  Take all of your regular medications. If you take the hydrocodone and feel nauseated take the Zofran ODT.

## 2022-11-26 NOTE — ED Provider Notes (Signed)
[facility] Provider Note    Event Date/Time   First MD Initiated Contact with Patient 11/26/22 [phone]     (approximate)   History   Hip Pain   HPI  Bethany Lee is a 72 y.o. female with history of diabetes, hypertension, kidney transplant in January 2024, recent sepsis where she was discharged from the hospital 10 days ago presents emergency department with severe lower back pain.  Radiates into the left hip.  No known injury.  Husband is at bedside and is helping with information as patient appears to be in a lot of pain.  I did ask him about her color if she appears to be more yellow and he said may be a little bit.  No history of liver failure.  He denies that she has had fever or chills.  States she recently finished her antibiotics.  States that they did diagnose her with urosepsis when she was at [facility] for sepsis.      Physical Exam   Triage Vital Signs: ED Triage Vitals  Enc Vitals Group     BP 11/26/22 0842 (!) 120/53     Pulse Rate 11/26/22 0842 69     Resp 11/26/22 0842 18     Temp 11/26/22 0842 98.3 F (36.8 C)     Temp Source 11/26/22 0842 Oral     SpO2 11/26/22 0842 100 %     Weight 11/26/22 0843 121 lb (54.9 kg)     Height 11/26/22 0843 5' (1.524 m)     Head Circumference --      Peak Flow --      Pain Score 11/26/22 0843 10     Pain Loc --      Pain Edu? --      Excl. in GC? --     Most recent vital signs: Vitals:   11/26/22 0842  BP: (!) 120/53  Pulse: 69  Resp: 18  Temp: 98.3 F (36.8 C)  SpO2: 100%     General: Awake, no distress.   CV:  Good peripheral perfusion. regular rate and  rhythm Resp:  Normal effort. Lungs cta Abd:  No distention.   Other:  Lumbar spine is extremely tender to palpation, left hip mildly tender to palpation, patient appears to be sweating and in pain, skin looks like it could possibly be jaundiced but she does have an all of complexion and not sure if it is the lighting, neurovascular is  intact, she does have some confusion husband states this is at baseline   ED Results / Procedures / Treatments   Labs (all labs ordered are listed, but only abnormal results are displayed) Labs Reviewed  COMPREHENSIVE METABOLIC PANEL - Abnormal; Notable for the following components:      Result Value   Chloride 112 (*)    CO2 19 (*)    Glucose, Bld 164 (*)    BUN 51 (*)    Creatinine, Ser 1.15 (*)    GFR, Estimated 50 (*)    All other components within normal limits  CBC WITH DIFFERENTIAL/PLATELET - Abnormal; Notable for the following components:   WBC 3.8 (*)    RBC 3.08 (*)    Hemoglobin 10.1 (*)    HCT 30.9 (*)    MCV 100.3 (*)    Platelets 95 (*)    Lymphs Abs 0.5 (*)    Abs Immature Granulocytes 0.14 (*)    All other components within normal limits  URINALYSIS,  ROUTINE W REFLEX MICROSCOPIC - Abnormal; Notable for the following components:   Color, Urine YELLOW (*)    APPearance CLEAR (*)    All other components within normal limits  SEDIMENTATION RATE     EKG     RADIOLOGY CT of the lumbar spine and pelvis I did not order contrast due to kidney transplant    PROCEDURES:   Procedures   MEDICATIONS ORDERED IN ED: Medications  morphine (PF) 4 MG/ML injection 4 mg (4 mg Intravenous Given 11/26/22 1115)  ondansetron (ZOFRAN) injection 4 mg (4 mg Intravenous Given 11/26/22 1113)     IMPRESSION / MDM / ASSESSMENT AND PLAN / ED COURSE  I reviewed the triage vital signs and the nursing notes.                              Differential diagnosis includes, but is not limited to, sepsis, osteomyelitis, fracture, radiculopathy, hepatic failure  Patient's presentation is most consistent with acute presentation with potential threat to life or bodily function.   Patient appears to be more ill than they recently assessed in triage, she is not tachycardic so not convinced that she has sepsis at this point.  However we will do lab work to assess basic labs, if white  count is elevated we will add a lactic.  X-ray of the left hip was independently reviewed and interpreted by me as being negative for any acute abnormality  Lab work, CT of the pelvis and CT lumbar spine without contrast secondary to patient's kidney transplant   Labs are reassuring  CT of the pelvis and CT of the lumbar spine were independently reviewed and interpreted by me as being negative for any acute abnormality  Did explain all the findings to the patient.  She is feeling better after pain medication.  I do not feel that she is septic or would benefit from any further workup within the hospital.  She is to follow-up with her regular doctor.  Return emergency department worsening.  Given a prescription for pain medication and nausea medication.  Discharged stable condition.   FINAL CLINICAL IMPRESSION(S) / ED DIAGNOSES   Final diagnoses:  Left hip pain  History of sepsis  Acute left-sided low back pain with sciatica, sciatica laterality unspecified     Rx / DC Orders   ED Discharge Orders          Ordered    HYDROcodone-acetaminophen (NORCO/VICODIN) 5-325 MG tablet  Every 6 hours PRN        11/26/22 1224    ondansetron (ZOFRAN-ODT) 4 MG disintegrating tablet  Every 8 hours PRN        11/26/22 1224             Note:  This document was prepared using Dragon voice recognition software and may include unintentional dictation errors.    Faythe Ghee, PA-C 11/26/22 1612    Merwyn Katos, MD 11/27/22 [phone]

## 2022-11-28 ENCOUNTER — Ambulatory Visit: Admit: 2022-11-28 | Discharge: 2022-11-29 | Payer: MEDICARE

## 2022-11-28 DIAGNOSIS — Z08 Encounter for follow-up examination after completed treatment for malignant neoplasm: Secondary | ICD-10-CM | POA: Diagnosis not present

## 2022-11-28 DIAGNOSIS — Z87440 Personal history of urinary (tract) infections: Secondary | ICD-10-CM | POA: Diagnosis not present

## 2022-11-28 DIAGNOSIS — Z940 Kidney transplant status: Secondary | ICD-10-CM | POA: Diagnosis not present

## 2022-11-28 DIAGNOSIS — N390 Urinary tract infection, site not specified: Secondary | ICD-10-CM | POA: Diagnosis not present

## 2022-11-28 DIAGNOSIS — Z09 Encounter for follow-up examination after completed treatment for conditions other than malignant neoplasm: Secondary | ICD-10-CM | POA: Diagnosis not present

## 2022-11-28 DIAGNOSIS — Z8619 Personal history of other infectious and parasitic diseases: Principal | ICD-10-CM

## 2022-11-28 NOTE — Unmapped (Signed)
Referring Provider: Shiela Mayer  PCP: Barbette Reichmann, MD    Chief Complaint:   1. Recurrent UTI    2. Hx of sepsis        Subjective:     History of Present Illness:  Julie [name] is a 72 y.o. female with history of kidney transplant on immunosuppression who is seen in consultation at the request of Dr. Shiela Mayer for evaluation of recurrent UTI's.  I reviewed the records from [facility] describing patient's history of recurrent UTIs including recent admission (4/13-4/23) for ESBL E Coli bacteremia of suspected urinary source. Patient was discharged on a 14 day course of meropenem through 4/27. Fosfomycin for UTI prophylaxis was unfortunately cost prohibitive.      Last appointment with ID was 11/20/22. 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 Had perinephric drain in place postoperatively, removed 08/30/22  - Immunosuppression: Tacrolimus (goal 6-8), MMF 500 BID (decreased to 250mg  BID during hospitalization), Pred 5  - Prophylaxis: valganciclovir x 3 months (mod risk), TMP+SMX x 6 months  Estimated Creatinine Clearance: 32 mL/min (A) (based on SCr of 1.23 mg/dL (H)).     # Recurrent E.Coli Bacteremia/UTI  - 1/29 BCx/UCx E. Coli - s/p Levo x 3 weeks (thru stent removal, 3/15) (S: Cefazolin, Cipro/Levo, Pip-tazo, Cephalexin; R: TMP-SMX, Amp, Amp-sulb)  - 3/14 BCx/UCx ESBL/FQ-R E. Coli s/p 14 days Meropenem; also with perinephric fluid collection; (S: Erta, Gent, Mero, Tobra, Cefepime SDD; I: ceftaz; R: Cipro/Levo, Cefazolin, Ceftriaxone, Amp)  #thrush - treated with Fluconazole    - 4/13 admitted with fever 102.4, vomiting/diarrhea, lethargic  - 4/13 BCx 1/1: E. Coli (S: Cefepime SDD, Erta/Mero, [name], Tobra; I: Ceftaz, R: Amp, Atreonam, Cefazolin, Ceftriaxone, Cipro/Levo); UCx 10-50K E. Coli (S: Cefepime SDD, Erta/Mero, gent, nitrofurantoin, tobra; I: Ceftaz; R: Amp, Aztreonam, Cefazolin, Ceftriaxone, Cephalexin, Cipro/Levo, tetra, TMP-SMX)   - 4/14 BCx 1/1: (NGTD); serum Crypto Neg  - 4/15 BCx (NGTD), serum crypto neg  - 4/16 serum VZV neg, BCx NG  Rx: 4/13 Vanc/Pip-tazo--> 4/14 Vancomycin/Meropenem ---> 4/15 Vanc/Mero/IV acyclovir--> 4/17 Vanc/Mero->4/18 meropenem ->4/27 stop    Of note, also had perinephric fluid collection 3/14 that was drained and negative.   Plan while inpatient: discussion of methenamine + vitamin C in the outpatient setting. Started on vaginal estradiol 2 g nightly as well as cranberry supplement.     CT A/P 11/04/22  --Increased inflammatory stranding surrounding the left lower quadrant transplant kidney with a 8.3 cm crescentic fluid collection within the overlying soft tissue. There is associated thickening and inflammatory stranding of the adjacent abdominal musculature. The findings may reflect abscess or superinfection of a previously described hematoma at this location on prior renal transplant ultrasound, however trace/no significant fluid collection was noted at this location on noncontrast CT abdomen/pelvis 10/12/2022.    --Mildly prominent gas and fluid-filled loops of small bowel are noted throughout the abdomen. A focal area of bowel wall thickening and luminal narrowing is noted within the mid abdomen without evidence of complete obstruction. Findings are nonspecific and could reflect enteritis or potentially stricturing/adhesive disease at the area of bowel wall thickening in the setting of prior high-grade bowel obstruction and laparotomy.    --Additional chronic and incidental findings, as detailed within the body of the  report.      Patient reports that since discharge, she has been using the estrogen intermittently. However, they were also instructed to use 2 g daily. Her husband reports he tries to place it for her but she sometimes refuses it and the last time he was able to place it was about 3 days ago. They are unsure if they are taking the daily antibiotic prescribed by ID as her daughter is the one who manages her medicine. They do not think she has been taking the cranberry supplement.    Today, patient reports no bladder specific symptoms prior to nausea, vomiting, and elevated temp to 100 degrees. Denies worsening urgency, frequency, incontinence, dysuria prior. She denies any gynecologic complaints such as vaginal bleeding or pain. Patient is s/p hysterectomy in 1980.    Urinary Symptoms:  She denies leakage with cough and sneeze and denies leakage with urge.  She denies urgency and frequency of urination..  She has had 3 UTI's in the past year. She typically drinks 2 cups of coffee and 2 of plain water.     Pelvic Organ Prolapse Symptoms:                   She denies a feeling of a bulge the vaginal area. She has had a hysterectomy    Bowel Symptom:  She has a bowel movement 1 time(s) per day.  Her stools are usually soft.  She uses stool softener.  She denies straining and denies splinting. She denies fecal incontinence. She empties her rectum with defecation.  She denies urgency with defecation.    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)     since July 2017    Hypertension     Hypothyroidism (acquired)     Kidney transplant status, cadaveric 08/08/2022    Type 2 diabetes mellitus (CMS-HCC)        Past Surgical History:   Procedure Laterality Date    CATARACT EXTRACTION      HYSTERECTOMY      OOPHORECTOMY      PERITONEAL CATHETER INSERTION      PR LAP INSERTION TUNNELED INTRAPERITONEAL CATHETER N/A 09/27/2018    Procedure: LAPAROSCOPY, SURGICAL; WITH INSERTION OF INTRAPERITONEAL CANNULA OR CATHETER, PERMANENT;  Surgeon: Leona Carry, MD;  Location: MAIN OR [place]; Service: Transplant    PR LAP INSERTION TUNNELED INTRAPERITONEAL CATHETER N/A 04/08/2019    Procedure: LAPAROSCOPY, SURGICAL; WITH INSERTION OF INTRAPERITONEAL CANNULA OR CATHETER, PERMANENT;  Surgeon: Leona Carry, MD;  Location: MAIN OR [facility];  Service: Transplant    PR LAP REVISE INTRAPERITONEAL CATHETER N/A 08/26/2019    Procedure: LAPAROSCOPY, SURGICAL; W/REVIS PREV PLACED INTRAPERITONEAL CANNULA/CATH, REMOV INTRALUMIN OBSTRUCT MATERIAL;  Surgeon: Leona Carry, MD;  Location: MAIN OR [facility];  Service: Transplant    PR REDUCE VOLVULUS,INTUSS,INTERN HERNIA N/A 10/13/2022    Procedure: REDUCTION OF VOLVULUS, INTUSSUSCEPTION, INTERNAL HERNIA, BY LAPAROTOMY;  Surgeon: Suella Broad, MD;  Location: MAIN OR [place];  Service: Trauma    PR REMOVAL TUNNELED INTRAPERITONEAL CATHETER N/A 05/06/2018    Procedure: REMOVAL OF PERMANENT INTRAPERITONEAL CANNULA OR CATHETER;  Surgeon: Leona Carry, MD;  Location: MAIN OR [facility];  Service: Transplant    PR REMOVE PERITONEAL FOREIGN BODY N/A 04/08/2019    Procedure: Removal Of Peritoneal Of Foreign Body From Peritoneal Cavity;  Surgeon: Leona Carry, MD;  Location: MAIN OR [facility];  Service: Transplant    PR TRANSPLANT,PREP RENAL GRAFT/ARTERIAL Right 08/06/2022    Procedure: [facility] RECONSTRUCTION CADAVER/LIVING DONOR RENAL ALLOGRAFT PRIOR TO TRANSPLANT; ARTERIAL ANASTOMOSIS EAC;  Surgeon: Toledo, Lilyan Punt, MD;  Location: MAIN OR [facility];  Service: Transplant    PR TRANSPLANTATION OF KIDNEY Right 08/06/2022    Procedure: RENAL ALLOTRANSPLANTATION, IMPLANTATION OF GRAFT; WITHOUT RECIPIENT NEPHRECTOMY;  Surgeon: Toledo, Lilyan Punt, MD;  Location: MAIN OR [facility];  Service: Transplant    TUBAL LIGATION         OB History       Gravida   2    Para   2    Term   2    Preterm        AB        Living             SAB        IAB        Ectopic        Molar        Multiple        Live Births                    She has had 2 vaginal deliveries and 0 cesarean deliveries.     Gynecologic History:  She is menopausal.  She does not have a history of abnormal Pap smears.       Current Outpatient Medications:     amlodipine (NORVASC) 5 MG tablet, Take 1 tablet (5 mg total) by mouth every twelve (12) hours., Disp: 90 tablet, Rfl: 3    aspirin (ECOTRIN) 81 MG tablet, Take 1 tablet (81 mg total) by mouth daily., Disp: 90 tablet, Rfl: 3    atorvastatin (LIPITOR) 10 MG tablet, Take 1 tablet (10 mg total) by mouth daily., Disp: 90 tablet, Rfl: 3    blood sugar diagnostic (ACCU-CHEK AVIVA PLUS TEST STRP) Strp, USE 1 STRIP TO CHECK GLUCOSE THREE TIMES DAILY, Disp: , Rfl:     blood sugar diagnostic (GLUCOSE BLOOD) Strp, Use to check blood sugar as directed with insulin 3 times a day & for symptoms of high or low blood sugar., Disp: 100 strip, Rfl: 11    blood sugar diagnostic Strp, USE 1 STRIP TO CHECK GLUCOSE THREE TIMES DAILY, Disp: , Rfl:     blood-glucose meter kit, Use as directed. ACCU-CHEK AVIVA, Disp: , Rfl:     blood-glucose meter kit, Use as instructed, Disp: 1 each, Rfl: 0    blood-glucose sensor (DEXCOM G7 SENSOR) Devi, Use 1 sensor every 10 days., Disp: 3 each, Rfl: 11    carvedilol (COREG) 25 MG tablet, Take 1 tablet (25 mg total) by mouth two (2) times a day., Disp: 180 tablet, Rfl: 3    chlorthalidone (HYGROTON) 25 MG tablet, Take 1 tablet (25 mg total) by mouth daily., Disp: 90 tablet, Rfl: 3    docusate sodium (COLACE) 100 MG capsule, Take 1 capsule (100 mg total) by mouth two (2) times a day as needed for constipation., Disp: 60 capsule, Rfl: 11    ergocalciferol-1,250 mcg, 50,000 unit, (VITAMIN D2-1,250 MCG, 50,000 UNIT,) 1,250 mcg (50,000 unit) capsule, Take 1 capsule (1,250 mcg total) by mouth once a week., Disp: , Rfl:     estradiol (ESTRACE) 0.01 % (0.1 mg/gram) vaginal cream, Insert 2 g into the vagina nightly., Disp: 60 g, Rfl: 11    hydrALAZINE (APRESOLINE) 50 MG  tablet, Take 1 tablet (50 mg total) by mouth two (2) times a day., Disp: 180 tablet, Rfl: 3    insulin aspart (NOVOLOG FLEXPEN SUBQ), Inject 0-8 Units under the skin Three (3) times a day before meals. *Max daily dose: 33 units*, Disp: , Rfl:     insulin glargine (BASAGLAR, LANTUS) 100 unit/mL (3 mL) injection pen, Inject 0.08 mL (8 Units total) under the skin nightly., Disp: 2.4 mL, Rfl: 0    lancets Misc, Use to check blood sugar as directed with insulin 3 times a day & for symptoms of high or low blood sugar., Disp: 100 each, Rfl: 0    levothyroxine (SYNTHROID) 112 MCG tablet, Take 1 tablet (112 mcg total) by mouth daily., Disp: 90 tablet, Rfl: 3    magnesium oxide-Mg AA chelate (MAGNESIUM PLUS PROTEIN) 133 mg, Take 1 tablet by mouth two (2) times a day., Disp: , Rfl:     melatonin 3 mg Tab, Take 2 tablets (6 mg total) by mouth every evening., Disp: 30 tablet, Rfl: 0    methocarbamol (ROBAXIN) 500 MG tablet, Take 1 tablet (500 mg total) by mouth four (4) times a day as needed (pain/cramping)., Disp: 30 tablet, Rfl: 0    mycophenolate (CELLCEPT) 250 mg capsule, Take 2 capsules (500 mg total) by mouth two (2) times a day., Disp: 360 capsule, Rfl: 3    nitrofurantoin, macrocrystal-monohydrate, (MACROBID) 100 MG capsule, Take 1 capsule (100 mg total) by mouth in the morning., Disp: 30 capsule, Rfl: 12    pantoprazole (PROTONIX) 40 MG tablet, Take 1 tablet (40 mg total) by mouth daily., Disp: 90 tablet, Rfl: 3    pen needle, diabetic 32 gauge x 5/32 (4 mm) Ndle, Use with insulin up to 4 times/day as needed., Disp: 100 each, Rfl: 11    pen needle, diabetic 32 gauge x 5/32 Ndle, Use with insulin 2 times daily, Disp: , Rfl:     polyethylene glycol (GLYCOLAX) 17 gram/dose powder, Take 17 g by mouth daily as needed., Disp: 510 g, Rfl: 11    predniSONE (DELTASONE) 5 MG tablet, Take 1 tablet (5 mg total) by mouth in the morning., Disp: 90 tablet, Rfl: 3    SENNA 8.6 mg tablet, Take 1 tablet by mouth daily as needed for constipation., Disp: 30 tablet, Rfl: 11    spironolactone (ALDACTONE) 25 MG tablet, Take 1 tablet (25 mg total) by mouth daily., Disp: , Rfl:     sulfamethoxazole-trimethoprim (BACTRIM) 400-80 mg per tablet, Take 1 tablet (80 mg of trimethoprim total) by mouth Every Monday, Wednesday, and Friday., Disp: 36 tablet, Rfl: 1    tacrolimus (PROGRAF) 0.5 MG capsule, Take 2 capsules (1 mg total) by mouth daily AND 1 capsule (0.5 mg total) nightly., Disp: 180 capsule, Rfl: 0    valGANciclovir (VALCYTE) 450 mg tablet, Take 1 tablet (450 mg total) by mouth daily., Disp: 90 tablet, Rfl: 0    Allergies   Allergen Reactions    Oxycodone Nausea And Vomiting     Per Pt cannot tolerate oxycodone    Gabapentin Other (See Comments)     Shakes    Valacyclovir Nausea Only and Other (See Comments)       Social History:   She is Married.     Family History   Problem Relation Age of Onset    Diabetes Mother     Alzheimer's disease Father     Diabetes Sister     Diabetes Sister     Diabetes Sister  Anesthesia problems Neg Hx     Bleeding Disorder Neg Hx        Review of Systems:  A full 10 system review was negative except as noted in the History of Present Illness.    Objective:     Physical Exam:   BP 156/62  - Pulse 64  - Ht 152.4 cm (5')  - Wt 55.3 kg (122 lb)  - BMI 23.83 kg/m??   Constitutional:  General appearance: Well nourished, well developed female in no acute distress.   Psych:  Normal mood and affect  Respiratory:  Normal respiratory effort.   Cardiovascular:  No lower extremity edema.   Skin:  Warm and dry, no lesions.  Lymphatic:  No inguinal lymphadenopathy.   Gastrointestional:  Well healed surgical scars. No masses.  No abdominal tenderness. No guarding or rebound.  Genitourinary:   Pelvic Exam: Normal external female genitalia; Bartholin's and Skene's glands normal in appearance; urethral meatus normal in appearance, no urethral masses or discharge. Speculum exam reveals vaginal mucosa without lesions and with atrophy; Cervix removed surgically. Uterus absent Adnexa  no mass, fullness, tenderness.  No masses on bimanual exam.      Post-Void Residual (PVR) by Bladder Scan:  In order to evaluate bladder emptying, we discussed obtaining a postvoid residual and she agreed to this procedure.   Procedure: The ultrasound unit was placed on the patient???s abdomen in the suprapubic region after the patient had voided. A PVR of 60 ml was obtained by bladder scan.        11/28/2022     8:19 AM   POP-Q Exams   gh 2   pb 3   tvl 8   aa -3   ba -3   ap -3   bp -3   c -8       Neuro Exam:   Alert and oriented x 3  Perineal sensation normal    Pelvic Floor Muscle Pain Exam:  Right retropubic: 0/10  Right obturator: 0/10  Right levator ani: 3/10  Left levator ani: 3/10  Left obturator: 0/10  Left retropubic: 1/10   Medical chaperone present for exam: Villa Herb, RN    Results for orders placed or performed in visit on 11/28/22   POCT Urinalysis Dipstick   Result Value Ref Range    Spec Gravity/POC 1.020 1.003 - 1.030    PH/POC 5.5 5.0 - 9.0    Leuk Esterase/POC Negative Negative    Nitrite/POC Negative Negative    Protein/POC Negative Negative    UA Glucose/POC Negative Negative    Ketones, POC Negative Negative    Bilirubin/POC Negative Negative    Blood/POC Negative Negative    Urobilinogen/POC 0.2 0.2 - 1.0 mg/dL     Assessment/Plan:     Assessment:  This is a 72 y.o. with  history of recurrent urosepsis and UTIs .    Plan:    Recurrent UTIs - Hx kidney transplant  - diagnosed by 3 in the last 6 months (08/21/22 and 10/05/22  and 4/13 cultures) with symptoms of upper tract infection (fever at home with the last episode) not preceded by bladder specific symptoms. Exam today was negative for structural issues.   - Discussed the importance of having a urine culture sent each time she has UTI specific symptoms. We discussed that UTI specific symptoms are usually acute (or acute change from baseline if she has lower urinary tract symptoms at baseline), and usually involve bladder related symptoms of UTI, which  may include urgency, frequency, pain with urination, or sudden worsening of your normal bladder issues, or systemic symptoms including fevers, white count, chills, sudden one-sided upper back pain, sudden pain in the lower abdomen, nausea, vomiting. Some of her lack of symptoms could be due to immunosuppression.   -I instructed the patient that she have a copy of the outside urine culture results sent to our office. Records requested today.  -For UTI prevention, we will use the following regimen:  - recommended stopping nitrofurantoin for now and start methenamine hippurate 1 gram twice daily along with vitamin C  - low dose vaginal estrogen 2-3 times weekly, 0.5-1g, at the opening of the vagina and meatus.   - start d-mannose 2 g daily  - drinking at least 45-60 oz of water daily   - voiding every 2-3 hours  Consider adding back daily antibiotics if she has interim symptomatic infections.   - Follow up with ID as scheduled on 01/01/23 and follow up with [place] in 6 months. Can consider additional workup such as cystoscopy at that time.   - We also discussed that she has a high risk of baseline bacteriuria (postmenopausal, history of renal transplants), and that cultures should not be performed in the absence of acute bladder specific symptoms or systemic symptoms such as fever, chills, n/v, as evidence has not shown improvement in rUTI or outcomes with treatment of bacteriuria that is not symptomatic even in patients with renal tranplants.     All questions answered. Handouts given.    Time Spent:    Medical Decision Making - Amount and Complexity of Data  1 point: I reviewed and/or ordered a clinical laboratory test (6xxxx series).   1 point: I reviewed and/or ordered a radiologic study (8xxxx series).  2 points: I have reviewed/summarized the patient's outside medical records and/or I have obtained history during today's visit from someone other than the patient and/or I have discussed the case with another healthcare provider  2 points: I independently visualized the image, tracing or specimen itself indicating no sign of infection or renal disease.    Seen with Dr. Cena Benton    I saw and evaluated the patient, participating in the key portions of the service.  I reviewed the resident???s note.  I agree with the residet???s findings and plan as documented in this note.     I personally spent 50 minutes face-to-face and non-face-to-face in the care of this patient, which includes all pre, intra, and post visit time on the date of service.    Sherian Maroon, MD  ATTENDING

## 2022-11-28 NOTE — Unmapped (Addendum)
-   Recurrent UTI  Please watch out for sudden changes in bladder related symptoms of UTI, which may include urgency, frequency, pain with urination, or sudden worsening of your normal bladder issues, or signs of more severe infection including fevers, chills, sudden one-sided upper back pain, sudden pain in the lower abdomen, nausea, vomiting.  We recommend testing the urine for infection only if you have have bladder related symptoms of a UTI or signs of a more severe infection.   If you think you have an infection, do not be concerned. Please call your PCP or go to an urgent care to have urine testing, including a urine culture sent. Please have them send [place] copies of the results  For prevention of UTIs, we recommend starting:   Drinking 45-60 oz of water daily  Urinating at least every 2-3 hours  low dose vaginal estrogen 0.5 to 1 gram,  2-3 times per week. Apply right inside the opening of the vagina and a small amount to the urethral opening at night.   d-mannose 2 g daily (you can find this at health food stores, online, or at certain [employer] and grocery stores)  You were recommended nitrofurantoin daily by your infectious disease doctor. You can stop nitrofurantoin for now and start methenamine hippurate.  methenamine hippurate (Hiprex) 1 gram twice daily. You may be able to crush or cut the pill in half if necessary - please check with your pharmacist. Take this with vitamin C 500 mg.     Follow up with ID in June and follow up with [place] in 6 months

## 2022-11-30 DIAGNOSIS — E1122 Type 2 diabetes mellitus with diabetic chronic kidney disease: Secondary | ICD-10-CM | POA: Diagnosis not present

## 2022-11-30 DIAGNOSIS — B9620 Unspecified Escherichia coli [E. coli] as the cause of diseases classified elsewhere: Secondary | ICD-10-CM | POA: Diagnosis not present

## 2022-11-30 DIAGNOSIS — N186 End stage renal disease: Secondary | ICD-10-CM | POA: Diagnosis not present

## 2022-11-30 DIAGNOSIS — D849 Immunodeficiency, unspecified: Secondary | ICD-10-CM | POA: Diagnosis not present

## 2022-11-30 DIAGNOSIS — D631 Anemia in chronic kidney disease: Secondary | ICD-10-CM | POA: Diagnosis not present

## 2022-11-30 DIAGNOSIS — G928 Other toxic encephalopathy: Secondary | ICD-10-CM | POA: Diagnosis not present

## 2022-11-30 DIAGNOSIS — N390 Urinary tract infection, site not specified: Secondary | ICD-10-CM | POA: Diagnosis not present

## 2022-11-30 DIAGNOSIS — I120 Hypertensive chronic kidney disease with stage 5 chronic kidney disease or end stage renal disease: Secondary | ICD-10-CM | POA: Diagnosis not present

## 2022-11-30 DIAGNOSIS — E039 Hypothyroidism, unspecified: Secondary | ICD-10-CM | POA: Diagnosis not present

## 2022-11-30 DIAGNOSIS — Z940 Kidney transplant status: Secondary | ICD-10-CM | POA: Diagnosis not present

## 2022-11-30 DIAGNOSIS — E119 Type 2 diabetes mellitus without complications: Principal | ICD-10-CM

## 2022-11-30 DIAGNOSIS — Z79899 Other long term (current) drug therapy: Principal | ICD-10-CM

## 2022-11-30 LAB — RENAL FUNCTION PANEL
ALBUMIN: 4.1 g/dL (ref 3.8–4.8)
BLOOD UREA NITROGEN: 58 mg/dL — ABNORMAL HIGH (ref 8–27)
BUN / CREAT RATIO: 39 — ABNORMAL HIGH (ref 12–28)
CALCIUM: 8.7 mg/dL (ref 8.7–10.3)
CHLORIDE: 102 mmol/L (ref 96–106)
CO2: 17 mmol/L — ABNORMAL LOW (ref 20–29)
CREATININE: 1.5 mg/dL — ABNORMAL HIGH (ref 0.57–1.00)
EGFR: 37 mL/min/{1.73_m2} — ABNORMAL LOW
GLUCOSE: 240 mg/dL — ABNORMAL HIGH (ref 70–99)
PHOSPHORUS, SERUM: 4.2 mg/dL (ref 3.0–4.3)
POTASSIUM: 5.9 mmol/L — ABNORMAL HIGH (ref 3.5–5.2)
SODIUM: 132 mmol/L — ABNORMAL LOW (ref 134–144)

## 2022-11-30 LAB — MAGNESIUM: MAGNESIUM: 2 mg/dL (ref 1.6–2.3)

## 2022-12-01 LAB — CBC W/ DIFFERENTIAL
BANDED NEUTROPHILS ABSOLUTE COUNT: 0.1 10*3/uL (ref 0.0–0.1)
BASOPHILS ABSOLUTE COUNT: 0 10*3/uL (ref 0.0–0.2)
BASOPHILS RELATIVE PERCENT: 0 %
EOSINOPHILS ABSOLUTE COUNT: 0 10*3/uL (ref 0.0–0.4)
EOSINOPHILS RELATIVE PERCENT: 0 %
HEMATOCRIT: 30 % — ABNORMAL LOW (ref 34.0–46.6)
HEMOGLOBIN: 10.1 g/dL — ABNORMAL LOW (ref 11.1–15.9)
IMMATURE GRANULOCYTES: 2 %
LYMPHOCYTES ABSOLUTE COUNT: 1 10*3/uL (ref 0.7–3.1)
LYMPHOCYTES RELATIVE PERCENT: 21 %
MEAN CORPUSCULAR HEMOGLOBIN CONC: 33.7 g/dL (ref 31.5–35.7)
MEAN CORPUSCULAR HEMOGLOBIN: 33.6 pg — ABNORMAL HIGH (ref 26.6–33.0)
MEAN CORPUSCULAR VOLUME: 100 fL — ABNORMAL HIGH (ref 79–97)
MONOCYTES ABSOLUTE COUNT: 0.5 10*3/uL (ref 0.1–0.9)
MONOCYTES RELATIVE PERCENT: 11 %
NEUTROPHILS ABSOLUTE COUNT: 3.2 10*3/uL (ref 1.4–7.0)
NEUTROPHILS RELATIVE PERCENT: 66 %
PLATELET COUNT: 122 10*3/uL — ABNORMAL LOW (ref 150–450)
RED BLOOD CELL COUNT: 3.01 x10E6/uL — ABNORMAL LOW (ref 3.77–5.28)
RED CELL DISTRIBUTION WIDTH: 14.4 % (ref 11.7–15.4)
WHITE BLOOD CELL COUNT: 4.9 10*3/uL (ref 3.4–10.8)

## 2022-12-02 LAB — TACROLIMUS LEVEL: TACROLIMUS BLOOD: 4.1 ng/mL (ref 2.0–20.0)

## 2022-12-04 DIAGNOSIS — M810 Age-related osteoporosis without current pathological fracture: Secondary | ICD-10-CM | POA: Diagnosis not present

## 2022-12-04 NOTE — Unmapped (Signed)
Placed call to patient via [employer] [phone]. Left message via [place] interpreter that Dr. Deno Etienne will need to follow up with pharmacy. Please wait for our direction - we will return call once we know per Dr. Deno Etienne. Provided nurse triage line for call back if needed.

## 2022-12-04 NOTE — Unmapped (Signed)
LOV 11/28/2022 with plan for Hiprex.  Received a call from pt husband requesting if you could send med to [facility] in [place].  Thank you.

## 2022-12-04 NOTE — Unmapped (Signed)
Reviewed recent labs with patient's spouse who reports she is probably only drinking 3 cups of water daily with meals.  Pt advised that she needs to increase her PO intake to 6-7 glasses of water or other PO intake per day.  Reviewed labs and low tac level 4.1 with Dr. Elvera Maria, no changes.  Plan  to repeat labs on Thursday at clinic and give IVF if needed.

## 2022-12-05 DIAGNOSIS — Z940 Kidney transplant status: Secondary | ICD-10-CM | POA: Diagnosis not present

## 2022-12-05 LAB — CBC W/ DIFFERENTIAL
BANDED NEUTROPHILS ABSOLUTE COUNT: 0.2 10*3/uL — ABNORMAL HIGH (ref 0.0–0.1)
BASOPHILS ABSOLUTE COUNT: 0 10*3/uL (ref 0.0–0.2)
BASOPHILS RELATIVE PERCENT: 0 %
EOSINOPHILS ABSOLUTE COUNT: 0 10*3/uL (ref 0.0–0.4)
EOSINOPHILS RELATIVE PERCENT: 1 %
HEMATOCRIT: 31.7 % — ABNORMAL LOW (ref 34.0–46.6)
HEMOGLOBIN: 10.5 g/dL — ABNORMAL LOW (ref 11.1–15.9)
IMMATURE GRANULOCYTES: 4 %
LYMPHOCYTES ABSOLUTE COUNT: 1.1 10*3/uL (ref 0.7–3.1)
LYMPHOCYTES RELATIVE PERCENT: 20 %
MEAN CORPUSCULAR HEMOGLOBIN CONC: 33.1 g/dL (ref 31.5–35.7)
MEAN CORPUSCULAR HEMOGLOBIN: 32.6 pg (ref 26.6–33.0)
MEAN CORPUSCULAR VOLUME: 98 fL — ABNORMAL HIGH (ref 79–97)
MONOCYTES ABSOLUTE COUNT: 0.6 10*3/uL (ref 0.1–0.9)
MONOCYTES RELATIVE PERCENT: 11 %
NEUTROPHILS ABSOLUTE COUNT: 3.5 10*3/uL (ref 1.4–7.0)
NEUTROPHILS RELATIVE PERCENT: 64 %
PLATELET COUNT: 131 10*3/uL — ABNORMAL LOW (ref 150–450)
RED BLOOD CELL COUNT: 3.22 x10E6/uL — ABNORMAL LOW (ref 3.77–5.28)
RED CELL DISTRIBUTION WIDTH: 13.7 % (ref 11.7–15.4)
WHITE BLOOD CELL COUNT: 5.5 10*3/uL (ref 3.4–10.8)

## 2022-12-06 LAB — RENAL FUNCTION PANEL
ALBUMIN: 4.3 g/dL (ref 3.8–4.8)
BLOOD UREA NITROGEN: 37 mg/dL — ABNORMAL HIGH (ref 8–27)
BUN / CREAT RATIO: 21 (ref 12–28)
CALCIUM: 9.1 mg/dL (ref 8.7–10.3)
CHLORIDE: 107 mmol/L — ABNORMAL HIGH (ref 96–106)
CO2: 20 mmol/L (ref 20–29)
CREATININE: 1.8 mg/dL — ABNORMAL HIGH (ref 0.57–1.00)
EGFR: 29 mL/min/{1.73_m2} — ABNORMAL LOW
GLUCOSE: 100 mg/dL — ABNORMAL HIGH (ref 70–99)
PHOSPHORUS, SERUM: 4 mg/dL (ref 3.0–4.3)
POTASSIUM: 5.3 mmol/L — ABNORMAL HIGH (ref 3.5–5.2)
SODIUM: 140 mmol/L (ref 134–144)

## 2022-12-06 LAB — MAGNESIUM: MAGNESIUM: 2.3 mg/dL (ref 1.6–2.3)

## 2022-12-07 ENCOUNTER — Ambulatory Visit: Admit: 2022-12-07 | Discharge: 2022-12-07 | Payer: MEDICARE

## 2022-12-07 ENCOUNTER — Ambulatory Visit: Admit: 2022-12-07 | Discharge: 2022-12-07 | Payer: MEDICARE | Attending: Registered" | Primary: Registered"

## 2022-12-07 ENCOUNTER — Encounter: Admit: 2022-12-07 | Discharge: 2022-12-07 | Payer: MEDICARE

## 2022-12-07 DIAGNOSIS — I10 Essential (primary) hypertension: Secondary | ICD-10-CM | POA: Diagnosis not present

## 2022-12-07 DIAGNOSIS — M25552 Pain in left hip: Secondary | ICD-10-CM | POA: Diagnosis not present

## 2022-12-07 DIAGNOSIS — Z940 Kidney transplant status: Secondary | ICD-10-CM | POA: Diagnosis not present

## 2022-12-07 DIAGNOSIS — M5432 Sciatica, left side: Secondary | ICD-10-CM | POA: Diagnosis not present

## 2022-12-07 DIAGNOSIS — Z4822 Encounter for aftercare following kidney transplant: Secondary | ICD-10-CM | POA: Diagnosis not present

## 2022-12-07 DIAGNOSIS — Z79899 Other long term (current) drug therapy: Secondary | ICD-10-CM | POA: Diagnosis not present

## 2022-12-07 DIAGNOSIS — Z79621 Long term (current) use of calcineurin inhibitor: Secondary | ICD-10-CM | POA: Diagnosis not present

## 2022-12-07 DIAGNOSIS — Z794 Long term (current) use of insulin: Secondary | ICD-10-CM | POA: Diagnosis not present

## 2022-12-07 DIAGNOSIS — N179 Acute kidney failure, unspecified: Secondary | ICD-10-CM | POA: Diagnosis not present

## 2022-12-07 DIAGNOSIS — M7918 Myalgia, other site: Secondary | ICD-10-CM | POA: Diagnosis not present

## 2022-12-07 DIAGNOSIS — Z7982 Long term (current) use of aspirin: Secondary | ICD-10-CM | POA: Diagnosis not present

## 2022-12-07 DIAGNOSIS — E119 Type 2 diabetes mellitus without complications: Secondary | ICD-10-CM | POA: Diagnosis not present

## 2022-12-07 DIAGNOSIS — I151 Hypertension secondary to other renal disorders: Secondary | ICD-10-CM | POA: Diagnosis not present

## 2022-12-07 DIAGNOSIS — R7881 Bacteremia: Principal | ICD-10-CM

## 2022-12-07 DIAGNOSIS — B9620 Unspecified Escherichia coli [E. coli] as the cause of diseases classified elsewhere: Principal | ICD-10-CM

## 2022-12-07 LAB — HEPATIC FUNCTION PANEL
ALBUMIN: 4 g/dL (ref 3.4–5.0)
ALKALINE PHOSPHATASE: 70 U/L (ref 46–116)
ALT (SGPT): 8 U/L — ABNORMAL LOW (ref 10–49)
AST (SGOT): 14 U/L (ref <=34)
BILIRUBIN DIRECT: 0.1 mg/dL (ref 0.00–0.30)
BILIRUBIN TOTAL: 0.3 mg/dL (ref 0.3–1.2)
PROTEIN TOTAL: 6.8 g/dL (ref 5.7–8.2)

## 2022-12-07 LAB — CBC W/ AUTO DIFF
BASOPHILS ABSOLUTE COUNT: 0 10*9/L (ref 0.0–0.1)
BASOPHILS RELATIVE PERCENT: 0.6 %
EOSINOPHILS ABSOLUTE COUNT: 0 10*9/L (ref 0.0–0.5)
EOSINOPHILS RELATIVE PERCENT: 0.5 %
HEMATOCRIT: 31.1 % — ABNORMAL LOW (ref 34.0–44.0)
HEMOGLOBIN: 10.5 g/dL — ABNORMAL LOW (ref 11.3–14.9)
LYMPHOCYTES ABSOLUTE COUNT: 0.6 10*9/L — ABNORMAL LOW (ref 1.1–3.6)
LYMPHOCYTES RELATIVE PERCENT: 10 %
MEAN CORPUSCULAR HEMOGLOBIN CONC: 33.8 g/dL (ref 32.0–36.0)
MEAN CORPUSCULAR HEMOGLOBIN: 33.5 pg — ABNORMAL HIGH (ref 25.9–32.4)
MEAN CORPUSCULAR VOLUME: 98.9 fL — ABNORMAL HIGH (ref 77.6–95.7)
MEAN PLATELET VOLUME: 9.2 fL (ref 6.8–10.7)
MONOCYTES ABSOLUTE COUNT: 0.4 10*9/L (ref 0.3–0.8)
MONOCYTES RELATIVE PERCENT: 7.5 %
NEUTROPHILS ABSOLUTE COUNT: 4.8 10*9/L (ref 1.8–7.8)
NEUTROPHILS RELATIVE PERCENT: 81.4 %
PLATELET COUNT: 113 10*9/L — ABNORMAL LOW (ref 150–450)
RED BLOOD CELL COUNT: 3.15 10*12/L — ABNORMAL LOW (ref 3.95–5.13)
RED CELL DISTRIBUTION WIDTH: 15 % (ref 12.2–15.2)
WBC ADJUSTED: 6 10*9/L (ref 3.6–11.2)

## 2022-12-07 LAB — BASIC METABOLIC PANEL
ANION GAP: 6 mmol/L (ref 5–14)
BLOOD UREA NITROGEN: 41 mg/dL — ABNORMAL HIGH (ref 9–23)
BUN / CREAT RATIO: 27
CALCIUM: 9.1 mg/dL (ref 8.7–10.4)
CHLORIDE: 108 mmol/L — ABNORMAL HIGH (ref 98–107)
CO2: 25.5 mmol/L (ref 20.0–31.0)
CREATININE: 1.52 mg/dL — ABNORMAL HIGH
EGFR CKD-EPI (2021) FEMALE: 36 mL/min/{1.73_m2} — ABNORMAL LOW (ref >=60)
GLUCOSE RANDOM: 61 mg/dL — ABNORMAL LOW (ref 70–179)
POTASSIUM: 4.7 mmol/L (ref 3.4–4.8)
SODIUM: 139 mmol/L (ref 135–145)

## 2022-12-07 LAB — HEMOGLOBIN A1C
ESTIMATED AVERAGE GLUCOSE: 120 mg/dL
HEMOGLOBIN A1C: 5.8 % — ABNORMAL HIGH (ref 4.8–5.6)

## 2022-12-07 LAB — PHOSPHORUS: PHOSPHORUS: 3.8 mg/dL (ref 2.4–5.1)

## 2022-12-07 LAB — BK VIRUS QUANTITATIVE PCR, BLOOD: BK BLOOD RESULT: NOT DETECTED

## 2022-12-07 LAB — TACROLIMUS LEVEL: TACROLIMUS BLOOD: 4 ng/mL (ref 2.0–20.0)

## 2022-12-07 LAB — CMV DNA, QUANTITATIVE, PCR: CMV VIRAL LD: NOT DETECTED

## 2022-12-07 LAB — MAGNESIUM: MAGNESIUM: 2 mg/dL (ref 1.6–2.6)

## 2022-12-07 MED ORDER — TACROLIMUS 0.5 MG CAPSULE, IMMEDIATE-RELEASE
ORAL_CAPSULE | ORAL | 0 refills | 45 days | Status: CP
Start: 2022-12-07 — End: ?

## 2022-12-07 MED ORDER — METHOCARBAMOL 500 MG TABLET
ORAL_TABLET | Freq: Two times a day (BID) | ORAL | 2 refills | 15 days | Status: CP | PRN
Start: 2022-12-07 — End: 2023-03-07

## 2022-12-07 MED ADMIN — sodium chloride 0.9% (NS) bolus 1,000 mL: 1000 mL | INTRAVENOUS | @ 14:00:00 | Stop: 2022-12-07

## 2022-12-07 NOTE — Unmapped (Addendum)
[facility] Outpatient Nutrition Services   Medical Nutrition Therapy Consultation  She declined an interpreter. She stated she can read [place] and her daughter will assist her, if needed.     Visit Type:    Return Assessment    Referral Reason: :  Post renal transplant medical nutrition therapy      Julie [name] is a 72 y.o. female seen for medical nutrition therapy for post renal transplant. Her active problem list, medication list, allergies, family history, social history, notes from last several encounters, and lab results were reviewed.     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    Renal transplant: 08/08/2022    Updates from last RD visit 10/20/22  Trying to eat more  Drinking glucerna at times; about every other day      Nutrition goals from last RD visit 10/20/22  Patient to meet 75% or greater of nutritional needs via combination of meals, snacks, and/or oral supplements within admission.  - Met    Anthropometrics on Dec 07, 2022  Height: 152.4 cm (5')   Weight: 56.7 kg (125 lb)  Body mass index is 24.41 kg/m??.  Normal weight    Usual body weight: 121-125 pounds for the past month; 124 pounds for the past month    Ideal Body Weight: 45.45 kg  Adjusted Body Weight: 50 kg     Wt Readings from Last 5 Encounters:   12/07/22 56.7 kg (125 lb)   12/07/22 56.7 kg (125 lb)   11/28/22 55.3 kg (122 lb)   11/24/22 55 kg (121 lb 3.2 oz)   11/20/22 56.1 kg (123 lb 9.6 oz)            Nutrition Risk Screening:     Nutrition Focused Physical Exam:     Patient does not meet AND/ASPEN criteria for malnutrition at this time (12/07/22 1610)    Frailty Index Score:        Malnutrition Screening:   Patient does not meet AND/ASPEN criteria for malnutrition at this time (12/07/22 9604)    Biochemical Data, Medical Tests and Procedures:  All pertinent labs and imaging reviewed by Piedad Climes, RD/LDN at 9:37 AM 12/07/2022.    Lab Results   Component Value Date    BUN 37 (H) 12/05/2022    CREATININE 1.80 (H) 12/05/2022    GFRAA 3 (L) 06/06/2016    GFRNONAA 2 (L) 06/06/2016    NA 140 12/05/2022    K 5.3 (H) 12/05/2022    CL 107 (H) 12/05/2022    CO2 20 12/05/2022    CALCIUM 9.1 12/05/2022    PHOS 3.1 11/14/2022    ALBUMIN 2.8 (L) 11/14/2022       No results found for: [facility]    Lab Results   Component Value Date    MG 2.3 12/05/2022    MG 2.0 11/30/2022    MG 2.0 11/15/2022    PHOS 3.1 11/14/2022    PHOS 3.5 11/13/2022    PHOS 2.3 (L) 11/12/2022       Lab Results   Component Value Date    CHOL 122 06/06/2022    HDL 32 (L) 06/06/2022    LDL 52 06/06/2022    TRIG 540 (H) 06/06/2022       Lab Results   Component Value Date    A1C 5.6 08/06/2022    A1C 5.6  04/29/2018    A1C 6.3 (H) 06/06/2016       Lab Results   Component Value Date    PROTEINUA 30 mg/dL (A) 09/81/1914    GLUCOSEU Negative 11/05/2022       Lab Results   Component Value Date    HGB 10.5 (L) 12/05/2022    HCT 31.7 (L) 12/05/2022       Lab Results   Component Value Date    IRON 34 (L) 10/07/2022    TIBC 173 (L) 10/07/2022    FERRITIN 1,017.0 (H) 08/08/2022     Fasting blood sugar 100 mg/dL  TIR 78% in 30 days ; 29% in 90 days  Has CGM     Medications and Vitamin/Mineral Supplementation:   All nutritionally pertinent medications reviewed on 12/07/2022.   Nutritionally pertinent medications include: atorvastatin, tacrolimus, insulin aspart, insulin glargine , pantoprazole,   She is taking nutrition supplements. Ergocalciferol  Taking cranberry extract, vitamin C , tums all not listed    Aware of food drug interactions    Current Outpatient Medications   Medication Sig Dispense Refill    amlodipine (NORVASC) 5 MG tablet Take 1 tablet (5 mg total) by mouth every twelve (12) hours. 90 tablet 3    aspirin (ECOTRIN) 81 MG tablet Take 1 tablet (81 mg total) by mouth daily. 90 tablet 3    atorvastatin (LIPITOR) 10 MG tablet Take 1 tablet (10 mg total) by mouth daily. 90 tablet 3    blood sugar diagnostic (ACCU-CHEK AVIVA PLUS TEST STRP) Strp USE 1 STRIP TO CHECK GLUCOSE THREE TIMES DAILY      blood sugar diagnostic (GLUCOSE BLOOD) Strp Use to check blood sugar as directed with insulin 3 times a day & for symptoms of high or low blood sugar. 100 strip 11    blood sugar diagnostic Strp USE 1 STRIP TO CHECK GLUCOSE THREE TIMES DAILY      blood-glucose meter kit Use as directed. ACCU-CHEK AVIVA      blood-glucose meter kit Use as instructed 1 each 0    blood-glucose sensor (DEXCOM G7 SENSOR) Devi Use 1 sensor every 10 days. 3 each 11    carvedilol (COREG) 25 MG tablet Take 1 tablet (25 mg total) by mouth two (2) times a day. 180 tablet 3    chlorthalidone (HYGROTON) 25 MG tablet Take 1 tablet (25 mg total) by mouth daily. 90 tablet 3    docusate sodium (COLACE) 100 MG capsule Take 1 capsule (100 mg total) by mouth two (2) times a day as needed for constipation. 60 capsule 11    ergocalciferol-1,250 mcg, 50,000 unit, (VITAMIN D2-1,250 MCG, 50,000 UNIT,) 1,250 mcg (50,000 unit) capsule Take 1 capsule (1,250 mcg total) by mouth once a week.      estradiol (ESTRACE) 0.01 % (0.1 mg/gram) vaginal cream Insert 2 g into the vagina nightly. 60 g 11    hydrALAZINE (APRESOLINE) 50 MG tablet Take 1 tablet (50 mg total) by mouth two (2) times a day. 180 tablet 3    insulin aspart (NOVOLOG FLEXPEN SUBQ) Inject 0-8 Units under the skin Three (3) times a day before meals. *Max daily dose: 33 units*      insulin glargine (BASAGLAR, LANTUS) 100 unit/mL (3 mL) injection pen Inject 0.08 mL (8 Units total) under the skin nightly. 2.4 mL 0    lancets Misc Use to check blood sugar as directed with insulin 3 times a day & for symptoms of high or low blood sugar. 100  each 0    levothyroxine (SYNTHROID) 112 MCG tablet Take 1 tablet (112 mcg total) by mouth daily. 90 tablet 3    magnesium oxide-Mg AA chelate (MAGNESIUM PLUS PROTEIN) 133 mg Take 1 tablet by mouth two (2) times a day. melatonin 3 mg Tab Take 2 tablets (6 mg total) by mouth every evening. 30 tablet 0    methocarbamol (ROBAXIN) 500 MG tablet Take 1 tablet (500 mg total) by mouth four (4) times a day as needed (pain/cramping). 30 tablet 0    mycophenolate (CELLCEPT) 250 mg capsule Take 2 capsules (500 mg total) by mouth two (2) times a day. 360 capsule 3    pantoprazole (PROTONIX) 40 MG tablet Take 1 tablet (40 mg total) by mouth daily. 90 tablet 3    pen needle, diabetic 32 gauge x 5/32 (4 mm) Ndle Use with insulin up to 4 times/day as needed. 100 each 11    pen needle, diabetic 32 gauge x 5/32 Ndle Use with insulin 2 times daily      polyethylene glycol (GLYCOLAX) 17 gram/dose powder Take 17 g by mouth daily as needed. 510 g 11    predniSONE (DELTASONE) 5 MG tablet Take 1 tablet (5 mg total) by mouth in the morning. 90 tablet 3    SENNA 8.6 mg tablet Take 1 tablet by mouth daily as needed for constipation. 30 tablet 11    spironolactone (ALDACTONE) 25 MG tablet Take 1 tablet (25 mg total) by mouth daily.      sulfamethoxazole-trimethoprim (BACTRIM) 400-80 mg per tablet Take 1 tablet (80 mg of trimethoprim total) by mouth Every Monday, Wednesday, and Friday. 36 tablet 1    tacrolimus (PROGRAF) 0.5 MG capsule Take 2 capsules (1 mg total) by mouth daily AND 1 capsule (0.5 mg total) nightly. 180 capsule 0    valGANciclovir (VALCYTE) 450 mg tablet Take 1 tablet (450 mg total) by mouth daily. 90 tablet 0     No current facility-administered medications for this visit.       Nutrition History:     Dietary Restrictions: No known food allergies or food intolerances.     Gastrointestinal Issues: Denied issues  Denies any problems chewing, swallowing, nausea, vomiting, or diarrhea at this time.    Hunger and Satiety:  Reports good appetite    Food Safety and Access: She did not report issues.     Diet Recall:   Time Intake   Breakfast [employer] sausage biscuit x coffee x 2   Snack (AM) Party mix (asian mix)   Lunch Big mac x french fries with sprite    Snack (PM) Glucerna (every other day)   Dinner Skips or [place] food from friends or big mac or fast food   Snack (HS) skips     Food-Related History:  Snacks:  candy bar or protein bar or black berries   Beverages:  coffee x 2 with cream   Dining Out:  4/21; chinese takeout ; [employer]cd[employer]  Cooking Methods: stove top  Usual Food Choices: listed above   Meal Schedule:  no     Eating Behaviors:  Overeating: Denied issues with overeating.   Emotional Eating: Denied issues.       Physical Activity:  Physical activity level is sedentary with little to no exercise.  She arrived to clinic in a wheel chair.     Daily Estimated Nutrient Needs:  Energy:   1418-1701 kcals [  25-30 kcal using  56.7 kg  ,ideal body weight  ]  Protein:   57-68 gm [  1-1.2 grams using  ,56.7 kg  ,ideal body weight   ]  Carbohydrate:   160-191 grams based on 45% total calories  Fluid:   per MD  Sodium:  <2300 mg    Nutrition Goals & Evaluation      Maintain current weight  (New)  Eat 3 meals daily  (New)  Glycemic control for a HgbA1c <7%  (New)  Exercise at least 150 minutes weekly  (New)  Drink at least 70 to 80 mg/dL fluids daily or per MD order  (New)    Nutrition goals reviewed, and relevant barriers identified and addressed: knowledge deficit . She is evaluated to have good willingness and ability to achieve nutrition goals.     Nutrition Assessment       Current diet is not adequate nor appropriate at this time for post renal medical nutrition therapy for transplant based on her typical dietary intake. Patient currently skips meals, consumes highly processed foods/high sodium intake from fast foods, with an inadequate intake of lean protein, non-starchy vegetables/fruit, limted whole grains, low fat dairy foods with limited physical activity. She has a very poor diet with limited nutrient dense foods. Patient would benefit from nutritional and lifestyle changes to assist with nutrition goals.     Patient is obese (class II). Patient has been weight stable for the past monthj. She needs to maintain her current weight.      Blood sugar are poor to fair control at this time based on recent HgbA1c. Patient may benefit from glucose monitoring to determine if medication, nutrition and/or lifestyle changes are needed to manage blood sugar for glycemic goal.     Patient assessed to have nutrition knowledge deficit for post renal transplant  medical nutrition therapy and would benefit from on-going nutrition education and counseling.            Nutrition Intervention      - Nutrition Education: post renal transplant medical nutrition therapy diet/eating style. Education resources provided include: handouts promoting nutrition intervention , after visit summary with patient instructions , and contact information   - Meals and Snacks    Nutrition Plan:   Eat 3 meals daily with lean protein and adequate carbohydrates; drink glucerna when you are not going to eat more than 50% of meals  Exercise at least 150 minutes weekly  Drink your fluids according to your provider's recommendation  Check your blood sugars as directed by your provider; your goal is for your glycemic control to have a HgbA1c <7%      Follow up will occur in in 3 months.     Food/Nutrition-related history, Anthropometric measurements, Biochemical data, medical tests, procedures, Nutrition-focused physical findings, Patient understanding or compliance with intervention and recommendations , and Effectiveness of nutrition interventions will be assessed at time of follow-up.         Recommendations for Clinical Team:  Eat 3 meals daily with lean protein and adequate carbohydrates; drink glucerna when you are not going to eat more than 50% of meals  Exercise at least 150 minutes weekly  Drink your fluids according to your provider's recommendation  Check your blood sugars as directed by your provider; your goal is for your glycemic control to have a HgbA1c <7%           Time spent 35 minutes   I am located on-site and the patient is located on-site for this visit.       Darel Hong  Konrad Dolores, MS RD - Clinical Dietitian II  Outpatient Nutrition Services   [facility]   [address], [place], [place] [address]  p [phone] f [phone]  [email or web address]

## 2022-12-07 NOTE — Unmapped (Signed)
Reviewed tac level 4.0 with Dr. Elvera Maria, called pts daughter and advised to increase tacrolimus dose to 1mg  bid.  Left Lorene Dy a detailed VM of the above and asked to call with questions or concerns.

## 2022-12-07 NOTE — Unmapped (Signed)
1120 IV fluids completed. Site infiltrated minutes prior to removal following patient going to restroom.  Catheter removed and pressure bandage placed. Instruction to patient and daughter to remove in approx 15-20 mins. Daughter verbalized understanding.

## 2022-12-07 NOTE — Unmapped (Signed)
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   Native kidney disease biopsy-proven diabetic nephropathy   Early post-txp course notable for multiple readmissions for UTI, ileus/SBO due to volvulus, and for resistant hypertension  Graft function: creatinine 0.9-1.2 recently; now with AKI - IVF in clinic and reassess  DSAs: not present as of 09/06/22  Post-surgical issues:  - aspirin for 1 year post-op if tolerated (history pre-txp thrombocytopenia)  - ureteral stent removed 2/15    # Immunosuppression  Tacrolimus (Prograf) 1 mg AM, 0.5 mg PM, goal trough 5-7 ng/mL  Mycophenolate (Cellcept/Myfortic) 500 mg BID  Prednisone 5 mg daily  I recommend a switch to belatacept - would help with cognition, BP and vulnerability to prerenal AKIs. 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     # Acute issues today  Left hip pain with sciatica, no midline tenderness. Occurs in AM, improves with exercise.   - ice pack  - refill methocarbamol  - referral to local PT and sports medicine (gave printed referrals)    # BP management   Goal 130/80, as tolerated. Currently signficiantly higher at home. Her tac is slightly over goal, likely contributing.   - amlodipine 5 mg BID  - carvedilol 25 mg BID  - chlorthalidone 25 mg daily  - hydralazine 50 mg BID  - spironolactone 25 mg daily    # Infectious disease  CMV D+/R+, EBV D+/R+  CMV ppx:  Valcyte x3 mo  PJP ppx: Bactrim x6 mo  Frequent UTIs - has had 3 serious E coli UTIs (with two bacteremias) since transplant. Saw Urogyn 11/28/22 and will see ID 01/01/23.  - discuss nitrofurantoin vs methenamine hippurate with Urogyn, ICID and PHarmD-  stay on nitrofurantoin until decision and start of the methenamine  - d-mannose and cranberry (taking both)  - vaginal estrodiol cream(2-3 times weekly)  - drink 60-70 oz water      # Anemia screening  Improving    # Cardiovascular: primary prevention  The 10-year ASCVD risk score (Arnett DK, et al., 2019) is: 40.4%  - atorvastatain    # CKD-BMD  - s/p ergo  - start     # Electrolytes  Reviewed, acceptable    # DM2  - Lantus 8 units takes a lunch   - Novolog 0-8 units TID AC + SSI     # Comorbidities      # Immunizations  Immunization History   Administered Date(s) Administered    COVID-19 VAC,BIVALENT(39YR UP),PFIZER 09/04/2019, 09/25/2019    COVID-19 VAC,MRNA,TRIS(12Y UP)(PFIZER)(GRAY CAP) 09/04/2019, 09/25/2019, 04/29/2020, 11/27/2020    COVID-19 VACC,MRNA,(PFIZER)(PF) 09/04/2019, 09/25/2019, 04/29/2020    Covid-19 Vacc, Unspecified 09/04/2019, 09/04/2019, 09/25/2019, 09/25/2019    HEPATITIS B VACCINE ADULT,IM(ENERGIX B, RECOMBIVAX) 04/27/2017    Hepatitis B Vaccine, Unspecified Formulation 04/27/2017    INFLUENZA INJ MDCK PF, QUAD,(FLUCELVAX)(76MO AND UP EGG FREE) 06/29/2017    INFLUENZA QUAD HIGH DOSE 37YRS+(FLUZONE) 04/05/2016, 04/27/2017, 05/31/2018, 04/10/2019    Influenza Virus Vaccine, unspecified formulation 04/24/2015, 05/26/2015, 04/05/2016, 04/05/2016, 04/27/2017, 04/27/2017, 05/31/2018, 05/31/2018, 04/10/2019, 04/10/2019, 05/09/2019, 04/29/2020, 05/09/2021, 05/09/2021, 04/24/2022, 04/26/2022    PNEUMOCOCCAL POLYSACCHARIDE 23-VALENT 07/09/2015    PPD Test 12/19/2015, 10/24/2016, 11/26/2017, 03/08/2019, 01/26/2020, 12/22/2020, 01/04/2022    Pneumococcal Conjugate 13-Valent 07/31/2018, 08/02/2018    Pneumococcal vaccine,  Unspecified Formulation 06/28/2016, 08/26/2021    Pneumococcal, Unspecified Formulation 08/26/2021    TdaP 09/25/2016       # Cancer screening  PAP smear: does not do  Mammogram: negative 11/2021  Colonoscopy: may be due  Skin: recommend yearly dermatology evaluation    # Follow up:  Labs every 1-2 weeks  Visits 3 months      Kidney Transplant History:   Date of Transplant: 08/06/2022 (Kidney)  Type of Transplant: DCD  KDPI: 56%  Cold ischemic time: 877 minutes (14 hr 37 min)  Warm ischemic time: 32 minutes  cPRA: 0%  HLA match:   Blood type: Donor A1, Recipient A POS  ID: CMV D+/R+, EBV D+/R+  Native Kidney Disease: Diabetes              Native kidney biopsy: Diabetic nephropathy (done elsewhere)              Pre-transplant dialysis course: PD started July 2017, transitioned to HD October 2019 due to catheter dysfunction/inadequate dialysis  Prior Transplants: None  Induction: Basiliximab  Early steroid withdrawal: No    Biopsies:   None    History of Presenting Illness:     Since the last visit:  - BP today 151/41 which is consistent with where she's running at home  - BG log not here today, but patient/family report it is reasonably well-controlled at home  - her daughter brings her to today's appt, her husband provides BG management at home, so we don't have complete history here    Post-transplant hospitalizations:  - 4/13-4/23/24 for ESBL E coli bacteremia of urinary source.  - 3/14-10/25/22 for E coli bacteremia of urinary source; c/b SBO due to small bowel volvulus s/p ex-lap for reduction of volvulus 10/13/22  - 1/29-08/28/22: E coli complicated UTI        Concerns about nonadherence: Yes, patient sundowns, her daughters can't be in the home in the evening so they watch her take her meds via FaceTime call    Social: Lives in [place] with her husband (APS case of concerns for safety). Two caregivers are daughters, one lives nearby. Patient speaks [place] and [place].    Review of Systems:   A 12-system review was negative except as documented in the HPI.    Physical Exam:     There were no vitals taken for this visit.  Constitutional:  Well-appearing in NAD  Eyes:  anicteric sclerae  ENT:  MMM  CV:  RRR, no m/r/g, no JVD, extremities WWP with no edema  Resp:  Good air movement, CTAB  GI:  Abdomen soft, NTND, +bs  MSK:  Grossly normal, exam is limited  Skin:  Normal turgor, no rash  Neuro:  Grossly normal, exam is limited  Psych:  Normal affect      Allergies:   Allergies   Allergen Reactions    Oxycodone Nausea And Vomiting     Per Pt cannot tolerate oxycodone    Gabapentin Other (See Comments)     Shakes    Valacyclovir Nausea Only and Other (See Comments)        Current Medications:   Current Outpatient Medications   Medication Sig Dispense Refill    amlodipine (NORVASC) 5 MG tablet Take 1 tablet (5 mg total) by mouth every twelve (12) hours. 90 tablet 3    aspirin (ECOTRIN) 81 MG tablet Take 1 tablet (81 mg total) by mouth daily. 90 tablet 3    atorvastatin (LIPITOR) 10 MG tablet Take  1 tablet (10 mg total) by mouth daily. 90 tablet 3    blood sugar diagnostic (ACCU-CHEK AVIVA PLUS TEST STRP) Strp USE 1 STRIP TO CHECK GLUCOSE THREE TIMES DAILY      blood sugar diagnostic (GLUCOSE BLOOD) Strp Use to check blood sugar as directed with insulin 3 times a day & for symptoms of high or low blood sugar. 100 strip 11    blood sugar diagnostic Strp USE 1 STRIP TO CHECK GLUCOSE THREE TIMES DAILY      blood-glucose meter kit Use as directed. ACCU-CHEK AVIVA      blood-glucose meter kit Use as instructed 1 each 0    blood-glucose sensor (DEXCOM G7 SENSOR) Devi Use 1 sensor every 10 days. 3 each 11    carvedilol (COREG) 25 MG tablet Take 1 tablet (25 mg total) by mouth two (2) times a day. 180 tablet 3    chlorthalidone (HYGROTON) 25 MG tablet Take 1 tablet (25 mg total) by mouth daily. 90 tablet 3    docusate sodium (COLACE) 100 MG capsule Take 1 capsule (100 mg total) by mouth two (2) times a day as needed for constipation. 60 capsule 11    ergocalciferol-1,250 mcg, 50,000 unit, (VITAMIN D2-1,250 MCG, 50,000 UNIT,) 1,250 mcg (50,000 unit) capsule Take 1 capsule (1,250 mcg total) by mouth once a week.      estradiol (ESTRACE) 0.01 % (0.1 mg/gram) vaginal cream Insert 2 g into the vagina nightly. 60 g 11    hydrALAZINE (APRESOLINE) 50 MG tablet Take 1 tablet (50 mg total) by mouth two (2) times a day. 180 tablet 3    insulin aspart (NOVOLOG FLEXPEN SUBQ) Inject 0-8 Units under the skin Three (3) times a day before meals. *Max daily dose: 33 units*      insulin glargine (BASAGLAR, LANTUS) 100 unit/mL (3 mL) injection pen Inject 0.08 mL (8 Units total) under the skin nightly. 2.4 mL 0    lancets Misc Use to check blood sugar as directed with insulin 3 times a day & for symptoms of high or low blood sugar. 100 each 0    levothyroxine (SYNTHROID) 112 MCG tablet Take 1 tablet (112 mcg total) by mouth daily. 90 tablet 3    magnesium oxide-Mg AA chelate (MAGNESIUM PLUS PROTEIN) 133 mg Take 1 tablet by mouth two (2) times a day.      melatonin 3 mg Tab Take 2 tablets (6 mg total) by mouth every evening. 30 tablet 0    methocarbamol (ROBAXIN) 500 MG tablet Take 1 tablet (500 mg total) by mouth four (4) times a day as needed (pain/cramping). 30 tablet 0    mycophenolate (CELLCEPT) 250 mg capsule Take 2 capsules (500 mg total) by mouth two (2) times a day. 360 capsule 3    pantoprazole (PROTONIX) 40 MG tablet Take 1 tablet (40 mg total) by mouth daily. 90 tablet 3    pen needle, diabetic 32 gauge x 5/32 (4 mm) Ndle Use with insulin up to 4 times/day as needed. 100 each 11    pen needle, diabetic 32 gauge x 5/32 Ndle Use with insulin 2 times daily      polyethylene glycol (GLYCOLAX) 17 gram/dose powder Take 17 g by mouth daily as needed. 510 g 11    predniSONE (DELTASONE) 5 MG tablet Take 1 tablet (5 mg total) by mouth in the morning. 90 tablet 3    SENNA 8.6 mg tablet Take 1 tablet by mouth daily as needed for constipation. 30  tablet 11    spironolactone (ALDACTONE) 25 MG tablet Take 1 tablet (25 mg total) by mouth daily.      sulfamethoxazole-trimethoprim (BACTRIM) 400-80 mg per tablet Take 1 tablet (80 mg of trimethoprim total) by mouth Every Monday, Wednesday, and Friday. 36 tablet 1    tacrolimus (PROGRAF) 0.5 MG capsule Take 2 capsules (1 mg total) by mouth daily AND 1 capsule (0.5 mg total) nightly. 180 capsule 0    valGANciclovir (VALCYTE) 450 mg tablet Take 1 tablet (450 mg total) by mouth daily. 90 tablet 0     No current facility-administered medications for this visit.       Past Medical History:   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)     since July 2017    Hypertension     Hypothyroidism (acquired)     Kidney transplant status, cadaveric 08/08/2022    Type 2 diabetes mellitus (CMS-HCC)         Laboratory studies:   Reviewed recent results.        Electronically signed by:   Leafy Half, MD  [facility]

## 2022-12-07 NOTE — Unmapped (Signed)
0945 PIV placed as ordered for lab draw and NS bolus.  Good blood return, and flushed without problem. Fluids running, patient left with call bell in place.

## 2022-12-07 NOTE — Unmapped (Signed)
Met w/ patient in [facility] today. Reviewed meds/symptoms. Any new medications?                 Fever/cold/flu symptoms denies  BP: 151/41 today/ Home BP reported did not bring log today but daughter reports BP about the same  BG: avg 14 day - 167 per dexcom  Headache/Dizziness/Lightheaded: denies  Hand tremors: denies  Numbness/tingling: denies  Fevers/chills/sweats: denies  CP/SOB/palpatations: denies  Nausea/vomiting/heartburn: denies  Diarrhea/constipation: denies  UTI symptoms (burn/pain/itch/frequency/urgency/odor/color/foam): denies  No visible or palpable edema     Appetite good; reports adequate hydration. 30-40 oz/bottles/fluid per day.     Pt reports being well rested and getting adequate exercise.     Continues to follow Covid/health safety precautions by taking care to mask, perform frequent hand hygeine and minimal public activity. Offered support and guidance for this process given their immune-suppressed state. We discussed reduced covid vaccine coverage for transplant patients and importance of continuing to mask and practice safe distancing. Commented that booster vaccines will likely be advised as an ongoing process.     Pain: reports L leg pain - follows with PCP next week.  Has ED visit last week and given hydrocodone.  Advised can take tylenol, heating pads     Last tacrolimus taken 2100; held for this morning's labs. Current dose 0.5/4 mg bid; Cellcept 500 mg bid, Pred 5 mg daily     Other complaints or concerns: had recent bone density scan.     Referrals needed: n/a     Pt Follow up: labs today, IVF bolus in clinic - repeat labs Monday.  Will need trugraf mobile draw     Immunization status: none in clinic today     Plan - stop TUMS - monitor Ca++ level  Stop ergo calciferol, start Vitamin D 3 2,000 units daily  Consult with ID on Hiprex vs cont macrobid

## 2022-12-07 NOTE — Unmapped (Addendum)
Please drink at least 60 oz daily of non-caffeinated fluids.     Plan for left buttock pain:  - ice pack  - refill methocarbamol  - referral to physical therapy  - referral to orthopedics/sports medicine    We will get you the following info for your decision-making about whether to switch tacrolimus to belatacept. This would likely help with BP, cognition, and make the kidney less sensitive to stress from drinking less fluids.

## 2022-12-07 NOTE — Unmapped (Signed)
Eat 3 meals daily with lean protein and adequate carbohydrates; drink glucerna when you are not going to eat more than 50% of meals  Exercise at least 150 minutes weekly  Drink your fluids according to your provider's recommendation  Check your blood sugars as directed by your provider; your goal is for your glycemic control to have a HgbA1c <7%

## 2022-12-08 ENCOUNTER — Institutional Professional Consult (permissible substitution): Admit: 2022-12-08 | Discharge: 2022-12-09 | Payer: MEDICARE

## 2022-12-08 DIAGNOSIS — M5416 Radiculopathy, lumbar region: Secondary | ICD-10-CM | POA: Diagnosis not present

## 2022-12-08 LAB — TACROLIMUS LEVEL, TROUGH: TACROLIMUS, TROUGH: 6.3 ng/mL (ref 5.0–15.0)

## 2022-12-08 NOTE — Unmapped (Signed)
KIDNEY POST-TRANSPLANT ASSESSMENT   Clinical Social Worker Telephone Note    Name:Julie [name]  Date of Birth:Sep 22, 1950  ZOX:[id number]    REFERRAL INFORMATION:    Julie [name] is s/p transplant for kidney transplantation . CSW follows up to assess general check-in.    PREFERRED LANGUAGE: English/Korean    INTERPRETER UTILIZED: N/A    TRANSPLANT DATE:   08/06/2022 (Kidney)    POST TXP RN COORDINATOR:   Celene Squibb [phone]; [phone]    SUMMARY:  Called pt to check in s/p last DC.  No answer.      Left VM and requested call back.    F/up q 3-4 weeks for general monitoring.      Lowella Petties, LCSW, CCTSW  Transplant Case Manager  [facility]  12/08/2022

## 2022-12-11 DIAGNOSIS — Z940 Kidney transplant status: Secondary | ICD-10-CM | POA: Diagnosis not present

## 2022-12-11 DIAGNOSIS — N390 Urinary tract infection, site not specified: Principal | ICD-10-CM

## 2022-12-11 MED ORDER — NITROFURANTOIN MONOHYDRATE/MACROCRYSTALS 100 MG CAPSULE
ORAL_CAPSULE | Freq: Every evening | ORAL | 0 refills | 90 days | Status: CP
Start: 2022-12-11 — End: 2023-03-11

## 2022-12-11 NOTE — Unmapped (Signed)
Left VM and will send MyChart message to update the UTI prevention plan.    Due to an interaction between Bactrim and Hiprex, will continue nitrofurantoin ppx until 6 months post-txp. At that time reassess.    Summary:  -continue nitrofurantion   -continue estradiol cream and cranberry and d-mannose  -hold off on the hiprex for now  -continue bactrim for PJP ppx

## 2022-12-11 NOTE — Unmapped (Signed)
Addended by: Leafy Half on: 12/11/2022 10:44 AM     Modules accepted: Orders

## 2022-12-12 LAB — CBC W/ DIFFERENTIAL
BANDED NEUTROPHILS ABSOLUTE COUNT: 0.1 10*3/uL (ref 0.0–0.1)
BASOPHILS ABSOLUTE COUNT: 0 10*3/uL (ref 0.0–0.2)
BASOPHILS RELATIVE PERCENT: 0 %
EOSINOPHILS ABSOLUTE COUNT: 0.1 10*3/uL (ref 0.0–0.4)
EOSINOPHILS RELATIVE PERCENT: 1 %
HEMATOCRIT: 32.2 % — ABNORMAL LOW (ref 34.0–46.6)
HEMOGLOBIN: 10 g/dL — ABNORMAL LOW (ref 11.1–15.9)
IMMATURE GRANULOCYTES: 1 %
LYMPHOCYTES ABSOLUTE COUNT: 1.3 10*3/uL (ref 0.7–3.1)
LYMPHOCYTES RELATIVE PERCENT: 24 %
MEAN CORPUSCULAR HEMOGLOBIN CONC: 31.1 g/dL — ABNORMAL LOW (ref 31.5–35.7)
MEAN CORPUSCULAR HEMOGLOBIN: 32.2 pg (ref 26.6–33.0)
MEAN CORPUSCULAR VOLUME: 104 fL — ABNORMAL HIGH (ref 79–97)
MONOCYTES ABSOLUTE COUNT: 0.5 10*3/uL (ref 0.1–0.9)
MONOCYTES RELATIVE PERCENT: 9 %
NEUTROPHILS ABSOLUTE COUNT: 3.6 10*3/uL (ref 1.4–7.0)
NEUTROPHILS RELATIVE PERCENT: 65 %
PLATELET COUNT: 125 10*3/uL — ABNORMAL LOW (ref 150–450)
RED BLOOD CELL COUNT: 3.11 x10E6/uL — ABNORMAL LOW (ref 3.77–5.28)
RED CELL DISTRIBUTION WIDTH: 13.5 % (ref 11.7–15.4)
WHITE BLOOD CELL COUNT: 5.5 10*3/uL (ref 3.4–10.8)

## 2022-12-12 LAB — RENAL FUNCTION PANEL
ALBUMIN: 4 g/dL (ref 3.8–4.8)
BLOOD UREA NITROGEN: 48 mg/dL — ABNORMAL HIGH (ref 8–27)
BUN / CREAT RATIO: 30 — ABNORMAL HIGH (ref 12–28)
CALCIUM: 8.9 mg/dL (ref 8.7–10.3)
CHLORIDE: 106 mmol/L (ref 96–106)
CO2: 19 mmol/L — ABNORMAL LOW (ref 20–29)
CREATININE: 1.62 mg/dL — ABNORMAL HIGH (ref 0.57–1.00)
EGFR: 33 mL/min/{1.73_m2} — ABNORMAL LOW
GLUCOSE: 155 mg/dL — ABNORMAL HIGH (ref 70–99)
PHOSPHORUS, SERUM: 4 mg/dL (ref 3.0–4.3)
POTASSIUM: 5.1 mmol/L (ref 3.5–5.2)
SODIUM: 138 mmol/L (ref 134–144)

## 2022-12-12 LAB — MAGNESIUM: MAGNESIUM: 2.2 mg/dL (ref 1.6–2.3)

## 2022-12-13 LAB — FSAB CLASS 2 ANTIBODY SPECIFICITY: HLA CL2 AB RESULT: POSITIVE

## 2022-12-13 LAB — HLA DS POST TRANSPLANT
ANTI-DONOR DRW #1 MFI: 267 MFI
ANTI-DONOR DRW #2 MFI: 267 MFI
ANTI-DONOR HLA-A #1 MFI: 17 MFI
ANTI-DONOR HLA-A #2 MFI: 16 MFI
ANTI-DONOR HLA-B #1 MFI: 31 MFI
ANTI-DONOR HLA-B #2 MFI: 0 MFI
ANTI-DONOR HLA-C #1 MFI: 0 MFI
ANTI-DONOR HLA-DP #2 MFI: 110 MFI
ANTI-DONOR HLA-DQB #1 MFI: 207 MFI
ANTI-DONOR HLA-DQB #2 MFI: 207 MFI
ANTI-DONOR HLA-DR #1 MFI: 34 MFI
ANTI-DONOR HLA-DR #2 MFI: 34 MFI

## 2022-12-13 LAB — FSAB CLASS 1 ANTIBODY SPECIFICITY: HLA CLASS 1 ANTIBODY RESULT: NEGATIVE

## 2022-12-13 LAB — TACROLIMUS LEVEL: TACROLIMUS BLOOD: 6.4 ng/mL (ref 2.0–20.0)

## 2022-12-19 DIAGNOSIS — R7881 Bacteremia: Principal | ICD-10-CM

## 2022-12-19 DIAGNOSIS — B9620 Unspecified Escherichia coli [E. coli] as the cause of diseases classified elsewhere: Principal | ICD-10-CM

## 2022-12-19 MED ORDER — TACROLIMUS 0.5 MG CAPSULE, IMMEDIATE-RELEASE
ORAL_CAPSULE | ORAL | 0 refills | 45.00000 days | Status: CN
Start: 2022-12-19 — End: 2023-12-19

## 2022-12-20 DIAGNOSIS — Z940 Kidney transplant status: Secondary | ICD-10-CM | POA: Diagnosis not present

## 2022-12-20 LAB — CBC W/ DIFFERENTIAL
BANDED NEUTROPHILS ABSOLUTE COUNT: 0.1 10*3/uL (ref 0.0–0.1)
BASOPHILS ABSOLUTE COUNT: 0 10*3/uL (ref 0.0–0.2)
BASOPHILS RELATIVE PERCENT: 1 %
EOSINOPHILS ABSOLUTE COUNT: 0.1 10*3/uL (ref 0.0–0.4)
EOSINOPHILS RELATIVE PERCENT: 2 %
HEMATOCRIT: 30.6 % — ABNORMAL LOW (ref 34.0–46.6)
HEMOGLOBIN: 10.1 g/dL — ABNORMAL LOW (ref 11.1–15.9)
IMMATURE GRANULOCYTES: 2 %
LYMPHOCYTES ABSOLUTE COUNT: 1.2 10*3/uL (ref 0.7–3.1)
LYMPHOCYTES RELATIVE PERCENT: 21 %
MEAN CORPUSCULAR HEMOGLOBIN CONC: 33 g/dL (ref 31.5–35.7)
MEAN CORPUSCULAR HEMOGLOBIN: 32.7 pg (ref 26.6–33.0)
MEAN CORPUSCULAR VOLUME: 99 fL — ABNORMAL HIGH (ref 79–97)
MONOCYTES ABSOLUTE COUNT: 0.5 10*3/uL (ref 0.1–0.9)
MONOCYTES RELATIVE PERCENT: 9 %
NEUTROPHILS ABSOLUTE COUNT: 3.7 10*3/uL (ref 1.4–7.0)
NEUTROPHILS RELATIVE PERCENT: 65 %
PLATELET COUNT: 108 10*3/uL — ABNORMAL LOW (ref 150–450)
RED BLOOD CELL COUNT: 3.09 x10E6/uL — ABNORMAL LOW (ref 3.77–5.28)
RED CELL DISTRIBUTION WIDTH: 13.2 % (ref 11.7–15.4)
WHITE BLOOD CELL COUNT: 5.6 10*3/uL (ref 3.4–10.8)

## 2022-12-20 LAB — RENAL FUNCTION PANEL
ALBUMIN: 4.3 g/dL (ref 3.8–4.8)
BLOOD UREA NITROGEN: 47 mg/dL — ABNORMAL HIGH (ref 8–27)
BUN / CREAT RATIO: 28 (ref 12–28)
CALCIUM: 9.2 mg/dL (ref 8.7–10.3)
CHLORIDE: 108 mmol/L — ABNORMAL HIGH (ref 96–106)
CO2: 18 mmol/L — ABNORMAL LOW (ref 20–29)
CREATININE: 1.66 mg/dL — ABNORMAL HIGH (ref 0.57–1.00)
EGFR: 32 mL/min/{1.73_m2} — ABNORMAL LOW
GLUCOSE: 203 mg/dL — ABNORMAL HIGH (ref 70–99)
PHOSPHORUS, SERUM: 3.6 mg/dL (ref 3.0–4.3)
POTASSIUM: 4.8 mmol/L (ref 3.5–5.2)
SODIUM: 139 mmol/L (ref 134–144)

## 2022-12-20 LAB — MAGNESIUM: MAGNESIUM: 2.2 mg/dL (ref 1.6–2.3)

## 2022-12-21 DIAGNOSIS — Z940 Kidney transplant status: Secondary | ICD-10-CM | POA: Diagnosis not present

## 2022-12-21 LAB — TACROLIMUS LEVEL: TACROLIMUS BLOOD: 3.6 ng/mL (ref 2.0–20.0)

## 2022-12-25 ENCOUNTER — Encounter
Admit: 2022-12-25 | Discharge: 2022-12-30 | Disposition: A | Discharge status: Home Health | Payer: MEDICARE | Source: Other Acute Inpatient Hospital

## 2022-12-25 ENCOUNTER — Ambulatory Visit: Admit: 2022-12-25 | Discharge: 2022-12-30 | Payer: MEDICARE

## 2022-12-25 ENCOUNTER — Ambulatory Visit
Admit: 2022-12-25 | Discharge: 2022-12-30 | Disposition: A | Discharge status: Home Health | Payer: MEDICARE | Source: Other Acute Inpatient Hospital

## 2022-12-25 ENCOUNTER — Encounter: Admit: 2022-12-25 | Discharge: 2022-12-30 | Payer: MEDICARE

## 2022-12-25 ENCOUNTER — Emergency Department: Payer: Medicare HMO

## 2022-12-25 ENCOUNTER — Emergency Department
Admission: EM | Admit: 2022-12-25 | Discharge: 2022-12-25 | Disposition: A | Discharge status: Home / Self Care | Payer: Medicare HMO | Attending: Emergency Medicine | Admitting: Emergency Medicine

## 2022-12-25 ENCOUNTER — Other Ambulatory Visit: Payer: Self-pay

## 2022-12-25 DIAGNOSIS — I132 Hypertensive heart and chronic kidney disease with heart failure and with stage 5 chronic kidney disease, or end stage renal disease: Secondary | ICD-10-CM | POA: Diagnosis not present

## 2022-12-25 DIAGNOSIS — I2510 Atherosclerotic heart disease of native coronary artery without angina pectoris: Secondary | ICD-10-CM | POA: Diagnosis not present

## 2022-12-25 DIAGNOSIS — I5030 Unspecified diastolic (congestive) heart failure: Secondary | ICD-10-CM | POA: Diagnosis not present

## 2022-12-25 DIAGNOSIS — N186 End stage renal disease: Secondary | ICD-10-CM | POA: Diagnosis not present

## 2022-12-25 DIAGNOSIS — E1122 Type 2 diabetes mellitus with diabetic chronic kidney disease: Secondary | ICD-10-CM | POA: Diagnosis not present

## 2022-12-25 DIAGNOSIS — R509 Fever, unspecified: Secondary | ICD-10-CM | POA: Insufficient documentation

## 2022-12-25 DIAGNOSIS — Z1152 Encounter for screening for COVID-19: Secondary | ICD-10-CM | POA: Insufficient documentation

## 2022-12-25 DIAGNOSIS — N12 Tubulo-interstitial nephritis, not specified as acute or chronic: Secondary | ICD-10-CM | POA: Diagnosis not present

## 2022-12-25 DIAGNOSIS — D6959 Other secondary thrombocytopenia: Secondary | ICD-10-CM | POA: Diagnosis not present

## 2022-12-25 DIAGNOSIS — D849 Immunodeficiency, unspecified: Secondary | ICD-10-CM | POA: Diagnosis not present

## 2022-12-25 DIAGNOSIS — R4182 Altered mental status, unspecified: Secondary | ICD-10-CM | POA: Diagnosis not present

## 2022-12-25 DIAGNOSIS — T8613 Kidney transplant infection: Secondary | ICD-10-CM | POA: Diagnosis not present

## 2022-12-25 DIAGNOSIS — E8729 Other acidosis: Secondary | ICD-10-CM | POA: Diagnosis not present

## 2022-12-25 DIAGNOSIS — R519 Headache, unspecified: Secondary | ICD-10-CM | POA: Diagnosis not present

## 2022-12-25 DIAGNOSIS — D84821 Immunodeficiency due to drugs: Secondary | ICD-10-CM | POA: Diagnosis not present

## 2022-12-25 DIAGNOSIS — D631 Anemia in chronic kidney disease: Secondary | ICD-10-CM | POA: Diagnosis not present

## 2022-12-25 DIAGNOSIS — R0689 Other abnormalities of breathing: Secondary | ICD-10-CM | POA: Diagnosis not present

## 2022-12-25 DIAGNOSIS — R9431 Abnormal electrocardiogram [ECG] [EKG]: Secondary | ICD-10-CM | POA: Diagnosis not present

## 2022-12-25 DIAGNOSIS — I10 Essential (primary) hypertension: Secondary | ICD-10-CM | POA: Diagnosis not present

## 2022-12-25 DIAGNOSIS — D539 Nutritional anemia, unspecified: Secondary | ICD-10-CM | POA: Diagnosis not present

## 2022-12-25 DIAGNOSIS — E875 Hyperkalemia: Secondary | ICD-10-CM | POA: Diagnosis not present

## 2022-12-25 DIAGNOSIS — R0602 Shortness of breath: Secondary | ICD-10-CM | POA: Diagnosis not present

## 2022-12-25 DIAGNOSIS — G9341 Metabolic encephalopathy: Secondary | ICD-10-CM | POA: Diagnosis not present

## 2022-12-25 DIAGNOSIS — D693 Immune thrombocytopenic purpura: Secondary | ICD-10-CM | POA: Diagnosis not present

## 2022-12-25 DIAGNOSIS — R1084 Generalized abdominal pain: Secondary | ICD-10-CM | POA: Diagnosis not present

## 2022-12-25 DIAGNOSIS — Z940 Kidney transplant status: Secondary | ICD-10-CM | POA: Diagnosis not present

## 2022-12-25 DIAGNOSIS — R109 Unspecified abdominal pain: Secondary | ICD-10-CM | POA: Diagnosis not present

## 2022-12-25 DIAGNOSIS — R918 Other nonspecific abnormal finding of lung field: Secondary | ICD-10-CM | POA: Diagnosis not present

## 2022-12-25 DIAGNOSIS — R1111 Vomiting without nausea: Secondary | ICD-10-CM | POA: Diagnosis not present

## 2022-12-25 DIAGNOSIS — Z79621 Long term (current) use of calcineurin inhibitor: Secondary | ICD-10-CM | POA: Diagnosis not present

## 2022-12-25 DIAGNOSIS — R262 Difficulty in walking, not elsewhere classified: Secondary | ICD-10-CM | POA: Diagnosis not present

## 2022-12-25 DIAGNOSIS — D61818 Other pancytopenia: Secondary | ICD-10-CM | POA: Diagnosis not present

## 2022-12-25 DIAGNOSIS — D72810 Lymphocytopenia: Secondary | ICD-10-CM | POA: Diagnosis not present

## 2022-12-25 DIAGNOSIS — D696 Thrombocytopenia, unspecified: Secondary | ICD-10-CM | POA: Diagnosis not present

## 2022-12-25 DIAGNOSIS — R112 Nausea with vomiting, unspecified: Secondary | ICD-10-CM | POA: Diagnosis not present

## 2022-12-25 DIAGNOSIS — Z452 Encounter for adjustment and management of vascular access device: Secondary | ICD-10-CM | POA: Diagnosis not present

## 2022-12-25 DIAGNOSIS — E1121 Type 2 diabetes mellitus with diabetic nephropathy: Secondary | ICD-10-CM | POA: Diagnosis not present

## 2022-12-25 DIAGNOSIS — E872 Acidosis, unspecified: Secondary | ICD-10-CM | POA: Diagnosis not present

## 2022-12-25 DIAGNOSIS — A041 Enterotoxigenic Escherichia coli infection: Secondary | ICD-10-CM | POA: Diagnosis not present

## 2022-12-25 LAB — BLOOD GAS CRITICAL CARE PANEL, VENOUS
BASE EXCESS VENOUS: -3.6 — ABNORMAL LOW (ref -2.0–2.0)
CALCIUM IONIZED VENOUS (MG/DL): 4.94 mg/dL (ref 4.40–5.40)
GLUCOSE WHOLE BLOOD: 152 mg/dL (ref 70–179)
HCO3 VENOUS: 21 mmol/L — ABNORMAL LOW (ref 22–27)
HEMOGLOBIN BLOOD GAS: 9.2 g/dL — ABNORMAL LOW (ref 12.00–16.00)
LACTATE BLOOD VENOUS: 0.7 mmol/L (ref 0.5–1.8)
O2 SATURATION VENOUS: 71.7 % (ref 40.0–85.0)
PCO2 VENOUS: 34 mmHg — ABNORMAL LOW (ref 40–60)
PH VENOUS: 7.39 (ref 7.32–7.43)
PO2 VENOUS: 37 mmHg (ref 30–55)
POTASSIUM WHOLE BLOOD: 4.3 mmol/L (ref 3.4–4.6)
SODIUM WHOLE BLOOD: 137 mmol/L (ref 135–145)

## 2022-12-25 LAB — URINALYSIS WITH MICROSCOPY
BILIRUBIN UA: NEGATIVE
BLOOD UA: NEGATIVE
GLUCOSE UA: NEGATIVE
HYALINE CASTS: 1 /LPF (ref 0–1)
KETONES UA: NEGATIVE
NITRITE UA: NEGATIVE
PH UA: 5 (ref 5.0–9.0)
PROTEIN UA: NEGATIVE
RBC UA: 2 /HPF (ref <=4)
SPECIFIC GRAVITY UA: 1.014 (ref 1.003–1.030)
SQUAMOUS EPITHELIAL: 1 /HPF (ref 0–5)
UROBILINOGEN UA: <2
WBC UA: 34 /HPF — ABNORMAL HIGH (ref 0–5)

## 2022-12-25 LAB — TSH: THYROID STIMULATING HORMONE: 0.113 u[IU]/mL — ABNORMAL LOW (ref 0.550–4.780)

## 2022-12-25 LAB — LEGIONELLA ANTIGEN, URINE: LEGIONELLA, URINARY ANTIGEN: NEGATIVE

## 2022-12-25 LAB — RESP PANEL BY RT-PCR (RSV, FLU A&B, COVID)  RVPGX2
Influenza A by PCR: NEGATIVE
Influenza B by PCR: NEGATIVE
Resp Syncytial Virus by PCR: NEGATIVE
SARS Coronavirus 2 by RT PCR: NEGATIVE

## 2022-12-25 LAB — COMPREHENSIVE METABOLIC PANEL
ALT: 10 U/L (ref 0–44)
AST: 15 U/L (ref 15–41)
Albumin: 3.9 g/dL (ref 3.5–5.0)
Alkaline Phosphatase: 45 U/L (ref 38–126)
Anion gap: 12 (ref 5–15)
BUN: 56 mg/dL — ABNORMAL HIGH (ref 8–23)
CO2: 17 mmol/L — ABNORMAL LOW (ref 22–32)
Calcium: 8.5 mg/dL — ABNORMAL LOW (ref 8.9–10.3)
Chloride: 110 mmol/L (ref 98–111)
Creatinine, Ser: 1.26 mg/dL — ABNORMAL HIGH (ref 0.44–1.00)
GFR, Estimated: 45 mL/min — ABNORMAL LOW (ref >60)
Glucose, Bld: 170 mg/dL — ABNORMAL HIGH (ref 70–99)
Potassium: 4.2 mmol/L (ref 3.5–5.1)
Sodium: 139 mmol/L (ref 135–145)
Total Bilirubin: 0.6 mg/dL (ref 0.3–1.2)
Total Protein: 6.2 g/dL — ABNORMAL LOW (ref 6.5–8.1)

## 2022-12-25 LAB — URINALYSIS, W/ REFLEX TO CULTURE (INFECTION SUSPECTED)
Bilirubin Urine: NEGATIVE
Glucose, UA: NEGATIVE mg/dL
Hgb urine dipstick: NEGATIVE
Ketones, ur: NEGATIVE mg/dL
Nitrite: NEGATIVE
Protein, ur: NEGATIVE mg/dL
Specific Gravity, Urine: 1.012 (ref 1.005–1.030)
Squamous Epithelial / HPF: NONE SEEN /HPF (ref 0–5)
pH: 5 (ref 5.0–8.0)

## 2022-12-25 LAB — LACTIC ACID, PLASMA: Lactic Acid, Venous: 1.1 mmol/L (ref 0.5–1.9)

## 2022-12-25 LAB — LIPASE, BLOOD: Lipase: 37 U/L (ref 11–51)

## 2022-12-25 LAB — TROPONIN I (HIGH SENSITIVITY)
Troponin I (High Sensitivity): 16 ng/L (ref <18)
Troponin I (High Sensitivity): 16 ng/L (ref <18)

## 2022-12-25 LAB — CULTURE, BLOOD (ROUTINE X 2): Culture: NO GROWTH

## 2022-12-25 LAB — CBC
HCT: 27.8 % — ABNORMAL LOW (ref 36.0–46.0)
Hemoglobin: 8.8 g/dL — ABNORMAL LOW (ref 12.0–15.0)
MCH: 31.8 pg (ref 26.0–34.0)
MCHC: 31.7 g/dL (ref 30.0–36.0)
MCV: 100.4 fL — ABNORMAL HIGH (ref 80.0–100.0)
Platelets: 66 10*3/uL — ABNORMAL LOW (ref 150–400)
RBC: 2.77 MIL/uL — ABNORMAL LOW (ref 3.87–5.11)
RDW: 13.2 % (ref 11.5–15.5)
WBC: 7.4 10*3/uL (ref 4.0–10.5)
nRBC: 0 % (ref 0.0–0.2)

## 2022-12-25 LAB — CBG MONITORING, ED: Glucose-Capillary: 149 mg/dL — ABNORMAL HIGH (ref 70–99)

## 2022-12-25 MED ORDER — VANCOMYCIN HCL IN DEXTROSE 1-5 GM/200ML-% IV SOLN
1000.0000 mg | Freq: Once | INTRAVENOUS | Status: AC
  Administered 2022-12-25: 1000 mg via INTRAVENOUS
  Filled 2022-12-25: qty 200

## 2022-12-25 MED ORDER — ACETAMINOPHEN 500 MG PO TABS
1000.0000 mg | ORAL_TABLET | Freq: Once | ORAL | Status: AC
  Administered 2022-12-25: 1000 mg via ORAL
  Filled 2022-12-25: qty 2

## 2022-12-25 MED ORDER — SODIUM CHLORIDE 0.9 % IV SOLN
2.0000 g | Freq: Once | INTRAVENOUS | Status: AC
  Administered 2022-12-25: 2 g via INTRAVENOUS
  Filled 2022-12-25: qty 12.5

## 2022-12-25 MED ORDER — OXYCODONE-ACETAMINOPHEN 5-325 MG PO TABS
1.0000 | ORAL_TABLET | Freq: Once | ORAL | Status: AC
  Administered 2022-12-25: 1 via ORAL
  Filled 2022-12-25: qty 1

## 2022-12-25 MED ADMIN — meropenem (MERREM) 500 mg in sodium chloride 0.9 % (NS) 100 mL IVPB-MBP: 500 mg | INTRAVENOUS | @ 22:00:00 | Stop: 2023-01-01

## 2022-12-25 MED ADMIN — predniSONE (DELTASONE) tablet 5 mg: 5 mg | ORAL | @ 22:00:00

## 2022-12-25 MED ADMIN — levothyroxine (SYNTHROID) tablet 112 mcg: 112 ug | ORAL | @ 22:00:00

## 2022-12-25 MED ADMIN — sulfamethoxazole-trimethoprim (BACTRIM) 400-80 mg tablet 80 mg of trimethoprim: 1 | ORAL | @ 22:00:00 | Stop: 2023-04-02

## 2022-12-25 NOTE — ED Triage Notes (Addendum)
Pt to ed from home via acems for abd pain, vomiting.   4mg  zofran 24G IV in right pinky  Pt is kidney transplant pt.   Pt was given 650 of PO tylenol by EMS upon arrival the ED.

## 2022-12-25 NOTE — ED Provider Notes (Addendum)
7:37 AM Assumed care for off going team.   Blood pressure (!) 157/45, pulse 74, temperature 99.9 F (37.7 C), temperature source Oral, resp. rate 20, height 5' (1.524 m), weight 54 kg, SpO2 98 %.  See their HPI for full report but in brief pending transfer to [facility]  On review of workup patient has a history of bacteremia x 2 is immunosuppressed from kidney transplant.  Her urine is without evidence of UTI and her CT imaging shows a left lower quadrant renal transplant with some mild edema.  8:03 AM reevaluated patient and updated that there is no current beds available at [facility].  They are going to try to call one of their transplant coordinators to let them know they are in the emergency room.  On repeat evaluation patient is alert and oriented x 3 she reports feeling better denies any pain.  Denies any neck stiffness.  9:10 AM reevaluated patient has recent fever of 102.1.  She has been here for over 8 hours has not received any Tylenol may be some from EMS?  Patient was given 1 g of Tylenol.  I did discuss again with the patient's husband and he had talked to his [facility] and they said that if there are no beds available that patient could stay here and there is nothing that looks significantly wrong with the transplant.  I will add on CT head given some concern for some confusion as well as CT chest given he does report that her initial symptom was some shortness of breath to ensure no other source for infection.  She seems to have supple neck and husband does not really feel like she is confused that typically when she gets bacteremic she moans like this intermittently but patient is able to answer questions and denies any headaches on consideration of meningitis.   CT head CT Chest negative tropes are negative x 2  I discussed the case with Dr. Rushie Chestnut from [facility] nephrology.  We discussed patient's fever but no clear source we discussed patient having no headaches or neck stiffness and whether  or not we want to proceed with LP given patient's platelets are low at 66.  He would recommend holding off patient would need further testing for other things such as CMV, cryptococcus and that if she is not improving on antibiotics and can consider LP at that time but he does recommend that patient get transferred over to [facility].  Patient was accepted under Dr Laverle Patter, MD 12/25/22 1040    Concha Se, MD 12/25/22 1314

## 2022-12-25 NOTE — ED Notes (Signed)
Called [facility] spoke with Bethany Lee no change on bed status

## 2022-12-25 NOTE — ED Notes (Signed)
Pt ambulated to bathroom with this RN. Pt denies pain

## 2022-12-25 NOTE — ED Provider Notes (Signed)
[facility] Provider Note    Event Date/Time   First MD Initiated Contact with Patient 12/25/22 0127     (approximate)   History   Abdominal Pain   HPI  Bethany Lee is a 72 y.o. female with a history of hypertension, hypercholesterolemia, CAD, diastolic CHF, ESRD status post kidney transplant, hypothyroidism, and type 2 diabetes who presents with altered mental status, fever, and an episode of vomiting.  The husband states that the patient was in her usual state of health yesterday.  She then awoke during the night and was moaning.  She expressed some abdominal pain.  She threw up 1 time.  Per the husband, she has had previous episodes of sepsis that started similarly.  The patient is unable to give much history.  I reviewed the past medical records.  The patient had a kidney transplant in January 2024.  She was most recently admitted at [facility] in April after presenting with vomiting and lethargy and was found to have UTI and E. coli bacteremia at that time.  Previously she was admitted in March for bacteremia and small bowel volvulus.   Physical Exam   Triage Vital Signs: ED Triage Vitals  Enc Vitals Group     BP 12/25/22 0110 (!) 185/66     Pulse Rate 12/25/22 0110 88     Resp 12/25/22 0110 16     Temp 12/25/22 0110 (!) 101.3 F (38.5 C)     Temp Source 12/25/22 0110 Oral     SpO2 12/25/22 0110 100 %     Weight 12/25/22 0111 119 lb 0.8 oz (54 kg)     Height 12/25/22 0111 5' (1.524 m)     Head Circumference --      Peak Flow --      Pain Score 12/25/22 0110 0     Pain Loc --      Pain Edu? --      Excl. in GC? --     Most recent vital signs: Vitals:   12/25/22 0500 12/25/22 0515  BP: (!) 157/45   Pulse: 77 74  Resp: (!) 22 20  Temp:    SpO2: 95% 98%     General: Sleepy appearing but arousable, oriented x 1, no acute distress. CV:  Good peripheral perfusion.  Resp:  Normal effort.  Lungs CTAB. Abd:  No distention.  Soft with mild  diffuse discomfort.  No focal tenderness. Other:  No significant peripheral edema.  No jaundice or scleral icterus.  Moist mucous membranes.   ED Results / Procedures / Treatments   Labs (all labs ordered are listed, but only abnormal results are displayed) Labs Reviewed  COMPREHENSIVE METABOLIC PANEL - Abnormal; Notable for the following components:      Result Value   CO2 17 (*)    Glucose, Bld 170 (*)    BUN 56 (*)    Creatinine, Ser 1.26 (*)    Calcium 8.5 (*)    Total Protein 6.2 (*)    GFR, Estimated 45 (*)    All other components within normal limits  CBC - Abnormal; Notable for the following components:   RBC 2.77 (*)    Hemoglobin 8.8 (*)    HCT 27.8 (*)    MCV 100.4 (*)    Platelets 66 (*)    All other components within normal limits  URINALYSIS, W/ REFLEX TO CULTURE (INFECTION SUSPECTED) - Abnormal; Notable for the following components:   Color, Urine YELLOW (*)  APPearance CLEAR (*)    Leukocytes,Ua TRACE (*)    Bacteria, UA FEW (*)    All other components within normal limits  RESP PANEL BY RT-PCR (RSV, FLU A&B, COVID)  RVPGX2  CULTURE, BLOOD (ROUTINE X 2)  CULTURE, BLOOD (ROUTINE X 2)  LIPASE, BLOOD  LACTIC ACID, PLASMA     EKG  ED ECG REPORT I, Dionne Bucy, the attending physician, personally viewed and interpreted this ECG.  Date: 12/25/2022 EKG Time: 0125 Rate: 86 Rhythm: normal sinus rhythm QRS Axis: normal Intervals: normal ST/T Wave abnormalities: Nonspecific ST abnormalities Narrative Interpretation: no evidence of acute ischemia; no T wave abnormalities when compared to EKG of 07/23/2022    RADIOLOGY  Chest x-ray: I independently viewed and interpreted the images; there is no focal consolidation or edema  CT abdomen/pelvis:   IMPRESSION:  1. Left lower quadrant renal transplant with mild transplant  perinephric edema, nonspecific but may be seen with urinary tract  infection. Recommend correlation with urinalysis. No  hydronephrosis.  2. Colonic diverticulosis without diverticulitis.  3. A 12 mm hypodensity in the upper spleen, not seen on prior. This  is nonspecific.    PROCEDURES:  Critical Care performed: Yes, see critical care procedure note(s)  .Critical Care  Performed by: Dionne Bucy, MD Authorized by: Dionne Bucy, MD   Critical care provider statement:    Critical care time (minutes):  30   Critical care time was exclusive of:  Separately billable procedures and treating other patients   Critical care was necessary to treat or prevent imminent or life-threatening deterioration of the following conditions:  Sepsis   Critical care was time spent personally by me on the following activities:  Development of treatment plan with patient or surrogate, discussions with consultants, evaluation of patient's response to treatment, examination of patient, ordering and review of laboratory studies, ordering and review of radiographic studies, ordering and performing treatments and interventions, pulse oximetry, re-evaluation of patient's condition, review of old charts and obtaining history from patient or surrogate   Care discussed with: admitting provider      MEDICATIONS ORDERED IN ED: Medications  ceFEPIme (MAXIPIME) 2 g in sodium chloride 0.9 % 100 mL IVPB (0 g Intravenous Stopped 12/25/22 0551)  vancomycin (VANCOCIN) IVPB 1000 mg/200 mL premix (0 mg Intravenous Stopped 12/25/22 0704)     IMPRESSION / MDM / ASSESSMENT AND PLAN / ED COURSE  I reviewed the triage vital signs and the nursing notes.  71 year old female with PMH as noted above including recent renal transplant and multiple admissions for sepsis presents with fever, altered mental status, vomiting, and some abdominal pain over the last couple of hours.  On exam the patient is febrile and hypertensive with otherwise normal vital signs.  Abdomen is soft with mild diffuse discomfort.  Differential diagnosis includes, but is  not limited to, acute infection/sepsis, possible UTI/pyonephritis, colitis, pneumonia, viral syndrome.  We will obtain sepsis workup, chest x-ray, CT abdomen/pelvis, and reassess.  Patient's presentation is most consistent with acute presentation with potential threat to life or bodily function.  The patient is on the cardiac monitor to evaluate for evidence of arrhythmia and/or significant heart rate changes.  ----------------------------------------- 5:34 AM on 12/25/2022 -----------------------------------------  CT shows mild perinephric edema which is similar to the findings on prior CTs.  It is unclear if this is acute/recurrent, or more chronic.  Chest x-ray shows no acute findings.  Lab workup is overall reassuring.  Urinalysis shows few bacteria and no other significant findings to  suggest active UTI.  There is no leukocytosis.  Lactate is normal.  Respiratory panel is negative.  Creatinine is 1.26.  Blood cultures have been sent.  On reassessment the patient is still sleepy but more arousable and is oriented x 2.  Vital signs are stable.  She denies any abdominal pain, headache, or other active pain at this time.  I have ordered empiric antibiotics for fever of unknown origin.  Given the patient's elevated risk with her kidney transplant she will need admission for further treatment and workup.  I contacted the [facility] transfer center although currently there is no bed availability and they have recommended to recheck after 7 AM.  ----------------------------------------- 7:10 AM on 12/25/2022 -----------------------------------------  The patient remains clinically stable.  We will attempt transfer to [facility] and if there are no beds available the patient can be admitted here.  I have signed the patient out to the oncoming ED physician Dr. Fuller Plan.   FINAL CLINICAL IMPRESSION(S) / ED DIAGNOSES   Final diagnoses:  Fever of unknown origin     Rx / DC Orders   ED Discharge Orders      None        Note:  This document was prepared using Dragon voice recognition software and may include unintentional dictation errors.    Dionne Bucy, MD 12/25/22 [phone]

## 2022-12-25 NOTE — ED Notes (Signed)
EMTALA reviewed by this RN, pt ready for transport  

## 2022-12-25 NOTE — Unmapped (Signed)
Transfer Center Request Note    Requesting Physician: Dr. 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 No clear source. Some mild mentation changes. CT of head pending. On empiric vancomycin and ceftazidime.       Plan Upon Arrival:     Thorough fever evaluation to include:    CMV viral load  Serum crypto antigen  Blood and urine cultures  Kidney transplant ultrasound    2.  Continue empiric broad-spectrum antibiotics.    3.  Immunocompromised ID consult      Bed Type: Floor    Accepting Service: MDB or Appropriate for medicine services as determined by [facility] for regionalization.    Zetta Bills. Stefano Gaul, MD  Date of service 12/25/22

## 2022-12-25 NOTE — Unmapped (Addendum)
[ ]  Given hepB core+, needs LFTs q75m for first year, HBSAg and HBV DNA q85m for first year and with any rise in LFTs   [ ]  HBSAg HBG DNA q80m            Acute Fever in Immunocompromised Host - Hx of recurrent MDR UTIs and Bacteremia   Presenting with confusion, fever, and vomiting x1. Labs remarkable for UA with leukocytes, no leukocytosis (c/w prior presentations), normal lactate, negative RPP.   Had several recent admissions s/p DDKT for MDR UTI and ESBL E.coli bacteremia treated with IV levofloxacin in January and IV meropenem in March 2024 and April 2024. Was on UTI prophylaxis with nitrofurantoin, estradiol cream, cranberry, and d-mannose. Received empiric vancomycin and cefepime at OSH and transitioned to meropenem and continued vancomycin at [facility]. Patient has remained afebrile on meropenem since 6/3 evening. Repeat Ucx at [facility] showing E.coli <10,000 though not significant enough to reliably label as source of infection. Suspect low Ucx bacteria counts due to prophylaxis as above. BCX NGTD at 48 hours at both OSH and [facility]. Will pursue treatment for presumed graft pyelonephritis in absence of clear infectious source with antibiotic course of 21 days. Given known MDR E.coli, will need IV antibiotics. Transitioned to IV ertapenem daily versus meropenem BID for home administration. 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 Elvera Maria. Lowest Cr ~0.95 after DDKT, presumed new baseline between 0.9-1.2. Cr 1.26 at baseline at OSH and stable at [facility] ~Cr 1.2-1.3 *** with anticipated lab margin of error. Continued home tacrolimus 1mg  BID, prednisone 5mg  daily, bactrim 400-80mg  MWF for PJP prophylaxis, protonox 40mg  PPI px. Held cellcept 500mg  BID in setting of infection of unknown origin, restarted on discharge. ASA 81 mg also held for anticipated procedure, restarted on discharge. Tac was monitored MWF and adjusted accordingly. ***    Acute Pseudothrombocytopenia on Chronic Thrombocytopenia  Known chronic thrombocytopenia ~110-130, acutely dropped to Plt 64 on 6/4. Smear indicative of pseudothrombocytopenia, unrevealing for pathological process.  Of note, patient has had similar drop in platelets during previous admissions for bacteremia with resolution back to baseline. ***resolving      Acute Metabolic Encephalopathy  Presented with hypoactive and hyperactive delirium on admission. Previous history of Hypoactive and Hyperactive delirium with bacteremia. Per daughter, patient is very affected by fevers. OSH CT head w/o contrast with no acute findings. Now oriented and alert x4. Has not been febrile since 6/3 evening. Was on delirium precautions during admission.    Chronic Problems    HTN:   - Continued home amlodipine 5mg  BID  - Continue home carvedilol 25mg  BID   - HOLD home chlorthalidone 25mg  BID  - Continue home hydralazine 50mg  BID  - Continue home spironolactone 25mg  daily      HLD: Continued home statin 10mg  daily     T2DM, insulin dependent: 12/07/22 HbA1C 5.8. On 6 units nightly Lantus and meal time insulin at home. Has not needed any insulin 6/3-6/4.  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 Hgb 8-9 this admission. Hgb ~10 at previous admission. Iron panel and ferritin labs pending.  - F/up Iron panel and ferritin      Hypothyroidism: levothyroxine 112 mcg daily  Sleeping: melatonin 6mg  nightly      Recent Thrush, candida esophagitis (march admission):     SBO s/p  ex-lap on recent admission:      Resolved     NAGMA (resolved)  Admission NAGMA with bicarb 17 resolved with oral bicarb to bicarb 21, pH 7.39.

## 2022-12-25 NOTE — Unmapped (Signed)
Vancomycin Therapeutic Monitoring Pharmacy Note    Julie [name] is a 72 y.o. female starting vancomycin. Date of therapy initiation: 12/25/22    Indication: Bacteremia/Sepsis    Prior Dosing Information:  Received 1000 mg IV x1 at OSH on 12/25/22 at 0554      Goals:  Therapeutic Drug Levels  Vancomycin trough goal: 10-15 mg/L    Additional Clinical Monitoring/Outcomes  Renal function, volume status (intake and output)    Results: Not applicable    Wt Readings from Last 1 Encounters:   12/25/22 55.3 kg (122 lb)     Creatinine   Date Value Ref Range Status   12/20/2022 1.66 (H) 0.57 - 1.00 mg/dL Final   09/81/1914 7.82 (H) 0.57 - 1.00 mg/dL Final   95/62/1308 6.57 (H) 0.55 - 1.02 mg/dL Final        Pharmacokinetic Considerations and Significant Drug Interactions:  Per linear dose adjustment  Concurrent nephrotoxic meds: not applicable    Assessment/Plan:  Recommendation(s)  Start vancomycin dose by level given AKI  No further doses today  Estimated trough on recommended regimen: Not applicable - dosing by level    Follow-up  Level due:  6/4 at 0600  A pharmacist will continue to monitor and order levels as appropriate    Please page service pharmacist with questions/clarifications.    Deanna Artis, PharmD

## 2022-12-25 NOTE — Unmapped (Signed)
Tacrolimus Therapeutic Monitoring Pharmacy Note    Julie [name] is a 72 y.o. female continuing tacrolimus.     Indication: Kidney transplant     Date of Transplant:  08/06/2022       Prior Dosing Information: Home regimen tacrolimus 1 mg BID      Source(s) of information used to determine prior to admission dosing: Home Medication List or Clinic Note    Goals:  Therapeutic Drug Levels  Tacrolimus trough goal:  6-8 ng/dl    Additional Clinical Monitoring/Outcomes  Monitor renal function (SCr and urine output) and liver function (LFTs)  Monitor for signs/symptoms of adverse events (e.g., hyperglycemia, hyperkalemia, hypomagnesemia, hypertension, headache, tremor)    Results:   Tacrolimus level: Not applicable    Pharmacokinetic Considerations and Significant Drug Interactions:  Concurrent hepatotoxic medications: None identified  Concurrent CYP3A4 substrates/inhibitors: None identified  Concurrent nephrotoxic medications: None identified    Assessment/Plan:  Recommendedation(s)  Continue current regimen of tacrolimus 1 mg BID  Of note, does appear to have missed AM dose on 6/3    Follow-up  Daily levels .   A pharmacist will continue to monitor and recommend levels as appropriate    Please page service pharmacist with questions/clarifications.    Deanna Artis, PharmD

## 2022-12-25 NOTE — Unmapped (Cosign Needed)
Nephrology (MEDB) History & Physical    Assessment & Plan:   Julie [name] is a 72 y.o. female whose presentation is complicated by s/p DDKT 08/06/22, HTN, HLD, T2DM, recent hx candida esophagitis, AoCD, thrombocytopenia that presented to [facility] from OSH with fever.     Principal Problem:    Fever  Active Problems:    Type 2 diabetes mellitus with diabetic nephropathy, with long-term current use of insulin (CMS-HCC)    Hypothyroidism, acquired    UTI (urinary tract infection)    Delirium    Immunosuppressed status (CMS-HCC)    Hypertension    Hyperlipidemia    Anemia of chronic disease    Chronic idiopathic thrombocytopenia (CMS-HCC)    Thrombocytopenia (CMS-HCC)  Resolved Problems:    * No resolved hospital problems. *      Acute Fever in Immunocompromised Host - Hx of recurrent MDR UTIs and Bacteremia   Presenting with confusion, fever, and vomiting x1 day. Labs remarkable for UA with trace leukocytes, Bicarb 17, no leukocytosis (c/w prior presentations), normal lactate, negative RPP. Was febrile to 102.45F and hypertensive to 185/66 at OSH. Pursuing infectious workup in a patient who is immunosuppressed with fever. CT A/P at OSH revealing mild perinephric edema around transplanted kidney, no hydronephrosis and 12mm nonspecific hypodensity in upper spleen. Imaging from April with new perinephric collection, unclear if fluid has increased or decreased. Renal US with slightly increased elevated resistive indices in main renal artery, stably mildly elevated resistive indices within segmental arteries and elevated velocities at MRA anastomosis similar to prior. Had several recent admissions s/p DDKT for MDR UTI and ESBL E.coli bacteremia treated with IV levofloxacin in January and IV meropenem in March 2024 and April 2024. On UTI prophylaxis with nitrofurantoin, estradiol cream, cranberry, and d-mannose. Per notes, methenamine hippurate was recently stopped due to interaction with bactrim. Started on empiric vancomycin and cefepime at OSH. Presentation most concerning for urinary source although unremarkable UA (has had bland UA in the past with bacteremia).  Low concern for URI infectious source given negative RPP, unremarkable CXR at OSH  She is confused but low suspicion for meningitis or encephalitis (sounds she has become delirious w/ prior infections).    - ICID consult in AM  - Stop Cefepime -> Transition to Meropenem (6/3-)  - Continue Vancomycin  (6/3-)  - F/up Bcx x2 at OSH and repeat at [facility] on 6/3   - Follow up UA and culture at OSH and [facility]  - F/up cryptococcus antigen, CMV, EBV, KG virus  - F/up legionella urine antigen  - Daily CBC w/ diff     S/p L DDKT 08/06/22 2/2 diabetic nephropathy I NAGMA   Follows with Dr. Elvera Maria. Lowest Cr ~0.95 after DDKT, presumed new baseline between 0.9-1.2. OSH Cr 1.26. Cr at baseline at OSH.  Does have non anion gab metabolic acidosis. Tac level 5/28 at goal.   - Obtain VBG   - Start Bicarb 650mg  TID  - Tacrolimus 1mg  BID  - Prednisone 5mg  daily  - Cellcept 500 mg BID, outpatient nephro considered transition to belatacept according to 5/16 note   - Bactrim 800-80mg  MWF for PJP ppx  - Protonix 40mg  for PPI ppx  - Held ASA 81 mg for any intervention  - Daily RFP, Mg, Phos  - Strict I/Os    Acute Metabolic Encephalopathy  Previous history of Hypoactive and Hyperactive delirium with bacteremia. Per daughter, patient is very affected by fevers. She is oriented to time and place,  but unable to complete orientation exams (days of the week, etc.). OSH CT head w/o contrast with no acute findings.  - Delirium precautions    Chronic Problems     HTN:   - Continued home amlodipine 5mg  BID  - Continue home carvedilol 25mg  BID,   - Hold home chlorthalidone 25mg  BID  - Continue home hydralazine 50mg  BID  - Continue home spironolactone 25mg  daily   HLD: Continued home statin 10mg  daily    T2DM, insulin dependent: 12/07/22 HbA1C 5.8. On 6 units nightly Lantus and meal time insulin at home.   - SSI   - Hold Lantus but consider readding PRN   - Hypoglycemia precautions    Anemia on CKD: March 2024 labs consistent with AoCD. Hgb 8.8 at OSH. Hgb ~10 at previous admission.  - Iron panel and ferritin     Hypothyroidism: levothyroxine 112 mcg daily  Sleeping: melatonin 6mg  nightly      The patient's presentation is complicated by the following clinically significant conditions requiring additional evaluation and treatment: - Thrombocytopenia POA requiring further investigation or monitor  - Metabolic Encephalopathy POA requiring further investigation or monitoring  - Chronic kidney disease POA requiring further investigation, treatment, or monitoring   - Age related debility POA requiring additional resources: DME, PT, or OT     Issues Impacting Complexity of Management:  -High risk of complications from pain and/or analgesia likely to result in delirium  -The patient is at high risk from Hospital immobility in an elderly patient given baseline poor functional status with a high risk of causing delirium and further decline in function      Medical Decision Making: Reviewed records from the following unique sources  past hospitalizations and OSH records from [facility].      Checklist:  Diet: Regular Diet  DVT PPx: Heparin 5000units q8h  Code Status: Full Code  Dispo: Patient appropriate for Inpatient based on expectation of ongoing need for hospitalization greater than two midnights based on severity of presentation/services including workup for infection in an immunocompromised patient and limited mental status    Team Contact Information:   Primary Team: Nephrology (MEDB)  Primary Resident: Linna Hoff, MD  Resident's Pager: [phone] (Nephrology Intern - Alvester Morin)    Chief Concern:   Fever      Subjective:   Julie [name] is a 72 y.o. female with pertinent PMHx of s/p DDKT 08/06/22, HTN, HLD, T2DM, recent hx candida esophagitis, AoCD, thrombocytopenia that presented to [facility] from OSH with fever and 1 episode of emesis.    History obtained by patient and daughter, Cecille Aver, given patient's limited orientation. Per daughter, patient is very affected by fevers.      HPI:  Mrs. Kobs presented to the hospital after throwing up last night, and moaning in pain, prompting her husband to bring her to [facility]. Per husband, she had fever around midnight and has had previous episodes of sepsis that started similarly. Per daughter, who does not live with her, there was also an episode of chest pain that may have also prompted arrival to the hospital, though there is documentation of chest pain in OSH records.    At OSH, UA revealing for trace leukocytes, bicarb 17, anemia 8.8 and thrombocytopenia 66. CBC otherwise unremarkable without luekocytosis and Cr 1.26 near baseline. Blood cultures sent, will work to get results. CXR, RPP and EKG unremarkable. Received empiric vancomycin and cefepime for fever of unknown origin. Per prior admission notes, previous episodes  of bacteremia started with fever w/o leukocytosis.    She denies any dysuria, diarrhea, abdominal pain    Per daughter, was in good spirits last week, highly affected by fevers. She manages her pill box       Pertinent Surgical Hx  DDKT 08/06/22  Exploratory laparotomy and reduction and small bowel volvulus on 10/13/22     Pertinent Family Hx  Unable to obtain due to limited orientation.    Pertinent Social Hx   She lives with her husband Janiyia Burkard. Further history limited by mental status.    Allergies  Oxycodone, Gabapentin, Penicillins, and Valacyclovir    I reviewed the Medication List. The current list is Accurate  Prior to Admission medications    Medication Dose, Route, Frequency   amlodipine (NORVASC) 5 MG tablet 5 mg, Oral, Every 12 hours   aspirin (ECOTRIN) 81 MG tablet 81 mg, Oral, Daily (standard)   atorvastatin (LIPITOR) 10 MG tablet 10 mg, Oral, Daily (standard)   blood sugar diagnostic (ACCU-CHEK AVIVA PLUS TEST STRP) Strp USE 1 STRIP TO CHECK GLUCOSE THREE TIMES DAILY   blood sugar diagnostic (GLUCOSE BLOOD) Strp Use to check blood sugar as directed with insulin 3 times a day & for symptoms of high or low blood sugar.   blood sugar diagnostic Strp USE 1 STRIP TO CHECK GLUCOSE THREE TIMES DAILY   blood-glucose meter kit Use as directed. ACCU-CHEK AVIVA   blood-glucose meter kit Use as instructed   blood-glucose sensor (DEXCOM G7 SENSOR) Devi Use 1 sensor every 10 days.   carvedilol (COREG) 25 MG tablet 25 mg, Oral, 2 times a day (standard)   chlorthalidone (HYGROTON) 25 MG tablet 25 mg, Oral, Daily (standard)   docusate sodium (COLACE) 100 MG capsule 100 mg, Oral, 2 times a day PRN   estradiol (ESTRACE) 0.01 % (0.1 mg/gram) vaginal cream 2 g, Vaginal, Nightly   hydrALAZINE (APRESOLINE) 50 MG tablet 50 mg, Oral, 2 times a day (standard)   insulin aspart (NOVOLOG FLEXPEN SUBQ) 0-8 Units, Subcutaneous, 3 times a day (AC), *Max daily dose: 33 units*   insulin glargine (BASAGLAR, LANTUS) 100 unit/mL (3 mL) injection pen 8 Units, Subcutaneous, Nightly  Patient taking differently: Inject 0.06 mL (6 Units total) under the skin nightly.   lancets Misc Use to check blood sugar as directed with insulin 3 times a day & for symptoms of high or low blood sugar.   levothyroxine (SYNTHROID) 112 MCG tablet 112 mcg, Oral, Daily (standard)   magnesium oxide-Mg AA chelate (MAGNESIUM PLUS PROTEIN) 133 mg 1 tablet, Oral, 2 times a day (standard)   melatonin 3 mg Tab 6 mg, Oral, Every evening   mycophenolate (CELLCEPT) 250 mg capsule 500 mg, Oral, 2 times a day (standard)   predniSONE (DELTASONE) 5 MG tablet 5 mg, Oral, Daily   SENNA 8.6 mg tablet 1 tablet, Oral, Daily PRN   spironolactone (ALDACTONE) 25 MG tablet 25 mg, Oral, Daily (standard)   sulfamethoxazole-trimethoprim (BACTRIM) 400-80 mg per tablet 1 tablet, Oral, Every Mon-Wed-Fri   tacrolimus (PROGRAF) 0.5 MG capsule Take 2 capsules (1 mg total) by mouth daily AND 2 capsules (1 mg total) nightly. methocarbamol (ROBAXIN) 500 MG tablet 500 mg, Oral, 2 times a day PRN  Patient not taking: Reported on 12/25/2022   nitrofurantoin, macrocrystal-monohydrate, (MACROBID) 100 MG capsule 100 mg, Oral, Nightly   pantoprazole (PROTONIX) 40 MG tablet 40 mg, Oral, Daily (standard)   pen needle, diabetic 32 gauge x 5/32 (4 mm) Ndle Use with insulin up  to 4 times/day as needed.   pen needle, diabetic 32 gauge x 5/32 Ndle Use with insulin 2 times daily   polyethylene glycol (GLYCOLAX) 17 gram/dose powder 17 g, Oral, Daily PRN  Patient not taking: Reported on 12/25/2022   valGANciclovir (VALCYTE) 450 mg tablet 450 mg, Oral, Daily (standard)  Patient not taking: Reported on 12/25/2022       Designated Healthcare Decision Maker:  Ms. Tabacco currently has decisional capacity for healthcare decision-making and is able to designate a surrogate healthcare decision maker. Ms. Filter designated healthcare decision maker(s) is/are Ziniyah Yorker (the patient's spouse) as denoted by stated patient preference.    Objective:   Physical Exam:  Temp:  [36.8 ??C (98.2 ??F)] 36.8 ??C (98.2 ??F)  Heart Rate:  [67-80] 80  SpO2 Pulse:  [99] 99  Resp:  [16] 16  BP: (154)/(39) 154/39  SpO2:  [98 %-99 %] 98 %    Gen: elderly female lying in bed in NAD, converses with repetition   Eyes: Sclera anicteric, EOMI grossly normal   HENT: Atraumatic, normocephalic  Neck: Trachea midline  Heart: RRR, flow murmur audible, left arm AV fistula with palpable thrill   Lungs: CTAB, no crackles or wheezes  Abdomen: Soft, NTND  Extremities: No edema, bruising on anterior distal extermeties  Neuro: Grossly symmetric, non-focal   Skin:  No rashes, lesions on clothed exam  Psych: Alert, oriented x2 - situation and self     [name], MS4    I attest that I have reviewed the student note and that the components of the history of the present illness, the physical exam, and the assessment and plan documented, were performed by me or were performed in my presence, by the student, where I verified the documentation and performed (or re-performed) the exam and medical decision making.       I attest that I have reviewed the medical student note and that the components of the history of the present illness, the physical exam, and the assessment and plan documented were performed by me or were performed in my presence by the student where I verified the documentation and performed (or re-performed) the exam and medical decision making.    Carolanne Grumbling, MD  [facility] Internal Medicine PGY-1  Pager: [phone]

## 2022-12-26 LAB — RENAL FUNCTION PANEL
ALBUMIN: 3.3 g/dL — ABNORMAL LOW (ref 3.4–5.0)
ANION GAP: 6 mmol/L (ref 5–14)
BLOOD UREA NITROGEN: 43 mg/dL — ABNORMAL HIGH (ref 9–23)
BUN / CREAT RATIO: 34
CALCIUM: 9.4 mg/dL (ref 8.7–10.4)
CHLORIDE: 109 mmol/L — ABNORMAL HIGH (ref 98–107)
CO2: 22 mmol/L (ref 20.0–31.0)
CREATININE: 1.27 mg/dL — ABNORMAL HIGH
EGFR CKD-EPI (2021) FEMALE: 45 mL/min/{1.73_m2} — ABNORMAL LOW (ref >=60)
GLUCOSE RANDOM: 130 mg/dL (ref 70–179)
PHOSPHORUS: 3.9 mg/dL (ref 2.4–5.1)
POTASSIUM: 4.7 mmol/L (ref 3.4–4.8)
SODIUM: 137 mmol/L (ref 135–145)

## 2022-12-26 LAB — MAGNESIUM: MAGNESIUM: 1.9 mg/dL (ref 1.6–2.6)

## 2022-12-26 LAB — CBC W/ AUTO DIFF
BASOPHILS ABSOLUTE COUNT: 0 10*9/L (ref 0.0–0.1)
BASOPHILS RELATIVE PERCENT: 0.5 %
EOSINOPHILS ABSOLUTE COUNT: 0 10*9/L (ref 0.0–0.5)
EOSINOPHILS RELATIVE PERCENT: 0.4 %
HEMATOCRIT: 26.5 % — ABNORMAL LOW (ref 34.0–44.0)
HEMOGLOBIN: 9 g/dL — ABNORMAL LOW (ref 11.3–14.9)
LYMPHOCYTES ABSOLUTE COUNT: 0.5 10*9/L — ABNORMAL LOW (ref 1.1–3.6)
LYMPHOCYTES RELATIVE PERCENT: 7.2 %
MEAN CORPUSCULAR HEMOGLOBIN CONC: 34.1 g/dL (ref 32.0–36.0)
MEAN CORPUSCULAR HEMOGLOBIN: 33.6 pg — ABNORMAL HIGH (ref 25.9–32.4)
MEAN CORPUSCULAR VOLUME: 98.4 fL — ABNORMAL HIGH (ref 77.6–95.7)
MEAN PLATELET VOLUME: 8.8 fL (ref 6.8–10.7)
MONOCYTES ABSOLUTE COUNT: 0.5 10*9/L (ref 0.3–0.8)
MONOCYTES RELATIVE PERCENT: 7.4 %
NEUTROPHILS ABSOLUTE COUNT: 6.2 10*9/L (ref 1.8–7.8)
NEUTROPHILS RELATIVE PERCENT: 84.5 %
PLATELET COUNT: 64 10*9/L — ABNORMAL LOW (ref 150–450)
RED BLOOD CELL COUNT: 2.69 10*12/L — ABNORMAL LOW (ref 3.95–5.13)
RED CELL DISTRIBUTION WIDTH: 13.9 % (ref 12.2–15.2)
WBC ADJUSTED: 7.3 10*9/L (ref 3.6–11.2)

## 2022-12-26 LAB — PROTIME-INR
INR: 0.98
PROTIME: 11 s (ref 9.9–12.6)

## 2022-12-26 LAB — BK VIRUS QUANTITATIVE PCR, BLOOD: BK BLOOD RESULT: NOT DETECTED

## 2022-12-26 LAB — TACROLIMUS LEVEL, TROUGH: TACROLIMUS, TROUGH: 3.2 ng/mL — ABNORMAL LOW (ref 5.0–15.0)

## 2022-12-26 LAB — CMV DNA, QUANTITATIVE, PCR: CMV VIRAL LD: NOT DETECTED

## 2022-12-26 LAB — PLATELET COUNT: PLATELET COUNT: 64 10*9/L — ABNORMAL LOW (ref 150–450)

## 2022-12-26 LAB — D-DIMER, QUANTITATIVE: D-DIMER QUANTITATIVE (CW,ML,HL,HS,CH,JS,JC,RX,RH): 585 ng{FEU}/mL — ABNORMAL HIGH (ref <=500)

## 2022-12-26 LAB — SLIDE REVIEW

## 2022-12-26 LAB — APTT
APTT: 32.1 s (ref 24.8–38.4)
HEPARIN CORRELATION: 0.2

## 2022-12-26 LAB — EBV QUANTITATIVE PCR, BLOOD: EBV VIRAL LOAD RESULT: NOT DETECTED

## 2022-12-26 LAB — FIBRINOGEN: FIBRINOGEN LEVEL: 593 mg/dL — ABNORMAL HIGH (ref 175–500)

## 2022-12-26 LAB — CRYPTOCOCCAL ANTIGEN, SERUM: CRYPTOCOCCAL ANTIGEN: NEGATIVE

## 2022-12-26 LAB — VANCOMYCIN, RANDOM: VANCOMYCIN RANDOM: 7 ug/mL

## 2022-12-26 LAB — CULTURE, BLOOD (ROUTINE X 2)

## 2022-12-26 MED ADMIN — carvedilol (COREG) tablet 25 mg: 25 mg | ORAL | @ 12:00:00

## 2022-12-26 MED ADMIN — atorvastatin (LIPITOR) tablet 10 mg: 10 mg | ORAL | @ 01:00:00

## 2022-12-26 MED ADMIN — magnesium oxide-Mg AA chelate (Magnesium Plus Protein) 1 tablet: 1 | ORAL | @ 01:00:00

## 2022-12-26 MED ADMIN — predniSONE (DELTASONE) tablet 5 mg: 5 mg | ORAL | @ 12:00:00

## 2022-12-26 MED ADMIN — tacrolimus (PROGRAF) capsule 1 mg: 1 mg | ORAL | @ 12:00:00

## 2022-12-26 MED ADMIN — meropenem (MERREM) 1 g in sodium chloride 0.9 % (NS) 100 mL IVPB-MBP: 1 g | INTRAVENOUS | @ 14:00:00 | Stop: 2023-01-01

## 2022-12-26 MED ADMIN — melatonin tablet 6 mg: 6 mg | ORAL | @ 21:00:00

## 2022-12-26 MED ADMIN — hydrALAZINE (APRESOLINE) tablet 50 mg: 50 mg | ORAL | @ 01:00:00

## 2022-12-26 MED ADMIN — polyethylene glycol (MIRALAX) packet 17 g: 17 g | ORAL | @ 12:00:00

## 2022-12-26 MED ADMIN — hydrALAZINE (APRESOLINE) tablet 50 mg: 50 mg | ORAL | @ 12:00:00

## 2022-12-26 MED ADMIN — vancomycin (VANCOCIN) IVPB 1000 mg (premix): 1000 mg | INTRAVENOUS | @ 14:00:00 | Stop: 2022-12-26

## 2022-12-26 MED ADMIN — melatonin tablet 6 mg: 6 mg | ORAL | @ 01:00:00

## 2022-12-26 MED ADMIN — mycophenolate (CELLCEPT) capsule 500 mg: 500 mg | ORAL | @ 01:00:00

## 2022-12-26 MED ADMIN — heparin (porcine) 5,000 unit/mL injection 5,000 Units: 5000 [IU] | SUBCUTANEOUS | @ 09:00:00

## 2022-12-26 MED ADMIN — heparin (porcine) 5,000 unit/mL injection 5,000 Units: 5000 [IU] | SUBCUTANEOUS | @ 02:00:00

## 2022-12-26 MED ADMIN — carvedilol (COREG) tablet 25 mg: 25 mg | ORAL | @ 01:00:00

## 2022-12-26 MED ADMIN — tacrolimus (PROGRAF) capsule 1 mg: 1 mg | ORAL | @ 01:00:00

## 2022-12-26 MED ADMIN — sodium bicarbonate tablet 650 mg: 650 mg | ORAL | @ 01:00:00

## 2022-12-26 MED ADMIN — amlodipine (NORVASC) tablet 5 mg: 5 mg | ORAL | @ 12:00:00

## 2022-12-26 MED ADMIN — spironolactone (ALDACTONE) tablet 25 mg: 25 mg | ORAL | @ 12:00:00

## 2022-12-26 MED ADMIN — pantoprazole (Protonix) EC tablet 40 mg: 40 mg | ORAL | @ 12:00:00

## 2022-12-26 MED ADMIN — sodium bicarbonate tablet 650 mg: 650 mg | ORAL | @ 12:00:00

## 2022-12-26 MED ADMIN — insulin lispro (HumaLOG) injection 0-20 Units: 0-20 [IU] | SUBCUTANEOUS | @ 22:00:00

## 2022-12-26 MED ADMIN — heparin (porcine) 5,000 unit/mL injection 5,000 Units: 5000 [IU] | SUBCUTANEOUS | @ 19:00:00

## 2022-12-26 MED ADMIN — amlodipine (NORVASC) tablet 5 mg: 5 mg | ORAL | @ 01:00:00

## 2022-12-26 MED ADMIN — acetaminophen (TYLENOL) tablet 650 mg: 650 mg | ORAL | @ 01:00:00

## 2022-12-26 MED ADMIN — levothyroxine (SYNTHROID) tablet 112 mcg: 112 ug | ORAL | @ 12:00:00

## 2022-12-26 MED ADMIN — mycophenolate (CELLCEPT) capsule 500 mg: 500 mg | ORAL | @ 12:00:00 | Stop: 2022-12-26

## 2022-12-26 MED ADMIN — sodium bicarbonate tablet 650 mg: 650 mg | ORAL | @ 19:00:00

## 2022-12-26 MED ADMIN — magnesium oxide-Mg AA chelate (Magnesium Plus Protein) 1 tablet: 1 | ORAL | @ 12:00:00

## 2022-12-26 NOTE — Unmapped (Signed)
PHYSICAL THERAPY  Evaluation (12/26/22 0918)          Patient Name:  Julie [name]       Medical Record Number: [id number]   Date of Birth: 10/01/1950  Sex: Female        Post-Discharge Physical Therapy Recommendations:  PT Post Acute Discharge Recommendations: 3x weekly   PT DME Recommendations: None           Treatment Diagnosis: Decreased activity tolerance, impaired balance     Activity Tolerance: Tolerated treatment well     ASSESSMENT  Problem List: Decreased endurance, Decreased mobility      Assessment : Patient is a 72 y.o. female whose presentation is complicated by s/p DDKT 08/06/22, HTN, HLD, T2DM, recent hx candida esophagitis, AoCD, thrombocytopenia that presented to [facility] from OSH with fever. Patient presents today with deficits in endurance, LE strength, and gait quality, and will benefit from acute PT intervention to address these impairments and ensure safe progression of mobility. Based on the AM-PAC six item raw score of 20/24, the patient is considered to be 33.32% impaired with basic mobility. This indicates that the patient may benefit from PT follow-up of 3x/week in order to address deficits in endurance, strength, and gait, and to ensure safe mobility in home environment. After a review of the personal factors, comorbidities, clinical presentation, and examination of the number of affected body systems, the patient presents as a low complexity case.      Today's Interventions: Patient and pt's husband educated on role of PT, PT POC, PT goals, use of RW vs rollator for safe mobility with longer distances or in the setting of increased fatigue. HEP handout provided including: seated LAQs, seated hip flexion marches, sit<>stand transfer repetitions, standing heel raises with UE support; pt verbalizes and demonstrates understanding.            PLAN  Planned Frequency of Treatment:  1-2x per day for: 2-3x week       Planned Interventions: Education (Patient/Family/Caregiver), Gait training, Home exercise program, Self-care / Home Management training, Therapeutic Exercise, Therapeutic Activity     Goals:   Patient and Family Goals: To return home     SHORT GOAL #1: Patient will perform all functional transfers modified independently using LRAD               Time Frame : 1 week  SHORT GOAL #2: Patient will ambulate 150 ft modified independently using LRAD              Time Frame : 1 week  SHORT GOAL #3: Patient will negotiate up/down 3 steps with unilateral handrail and supervision using LRAD              Time Frame : 1 week  SHORT GOAL #4: x                                        Long Term Goal #1: In 8 weeks, patient will ambulate >300 ft modified independently using LRAD in order to increase independence with mobility outside of the home  Time Frame: 2 months     Prognosis:  Good  Positive Indicators: PLOF, caregiver support  Barriers to Discharge: Endurance deficits     SUBJECTIVE  Patient reports: Patient and RN agreeable to PT, pt reports I feel good, I feel ready to go home  Prior Functional Status: Per pt and pt's husband, prior to admission, pt ambulates primarily household distances independently with no device. Had been able to walk to their garden outside and do some gardening. Denies any recent falls. Pt's husband is typically present at home, however will occasionally run errands. Also, pt's daughter is able to help if needed. Pt reports driving distances close to their home.  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)     since July 2017    Hypertension     Hypothyroidism (acquired)     Kidney transplant status, cadaveric 08/08/2022    Type 2 diabetes mellitus (CMS-HCC)             Social History     Tobacco Use    Smoking status: Former     Current packs/day: 0.00 Average packs/day: 0.8 packs/day for 30.0 years (22.5 ttl pk-yrs)     Types: Cigarettes     Start date: 06/06/1976     Quit date: 06/06/2006     Years since quitting: 16.5    Smokeless tobacco: Former   Substance Use Topics    Alcohol use: Yes     Comment: only special occasions       Past Surgical History:   Procedure Laterality Date    CATARACT EXTRACTION      HYSTERECTOMY      OOPHORECTOMY      PERITONEAL CATHETER INSERTION      PR LAP INSERTION TUNNELED INTRAPERITONEAL CATHETER N/A 09/27/2018    Procedure: LAPAROSCOPY, SURGICAL; WITH INSERTION OF INTRAPERITONEAL CANNULA OR CATHETER, PERMANENT;  Surgeon: Leona Carry, MD;  Location: MAIN OR [place];  Service: Transplant    PR LAP INSERTION TUNNELED INTRAPERITONEAL CATHETER N/A 04/08/2019    Procedure: LAPAROSCOPY, SURGICAL; WITH INSERTION OF INTRAPERITONEAL CANNULA OR CATHETER, PERMANENT;  Surgeon: Leona Carry, MD;  Location: MAIN OR [place];  Service: Transplant    PR LAP REVISE INTRAPERITONEAL CATHETER N/A 08/26/2019    Procedure: LAPAROSCOPY, SURGICAL; W/REVIS PREV PLACED INTRAPERITONEAL CANNULA/CATH, REMOV INTRALUMIN OBSTRUCT MATERIAL;  Surgeon: Leona Carry, MD;  Location: MAIN OR [facility];  Service: Transplant    PR REDUCE VOLVULUS,INTUSS,INTERN HERNIA N/A 10/13/2022    Procedure: REDUCTION OF VOLVULUS, INTUSSUSCEPTION, INTERNAL HERNIA, BY LAPAROTOMY;  Surgeon: Suella Broad, MD;  Location: MAIN OR [place];  Service: Trauma    PR REMOVAL TUNNELED INTRAPERITONEAL CATHETER N/A 05/06/2018    Procedure: REMOVAL OF PERMANENT INTRAPERITONEAL CANNULA OR CATHETER;  Surgeon: Leona Carry, MD;  Location: MAIN OR [facility];  Service: Transplant    PR REMOVE PERITONEAL FOREIGN BODY N/A 04/08/2019    Procedure: Removal Of Peritoneal Of Foreign Body From Peritoneal Cavity;  Surgeon: Leona Carry, MD;  Location: MAIN OR [facility];  Service: Transplant    PR TRANSPLANT,PREP RENAL GRAFT/ARTERIAL Right 08/06/2022    Procedure: [facility] RECONSTRUCTION CADAVER/LIVING DONOR RENAL ALLOGRAFT PRIOR TO TRANSPLANT; ARTERIAL ANASTOMOSIS EAC;  Surgeon: Toledo, Lilyan Punt, MD;  Location: MAIN OR [facility];  Service: Transplant    PR TRANSPLANTATION OF KIDNEY Right 08/06/2022    Procedure: RENAL ALLOTRANSPLANTATION, IMPLANTATION OF GRAFT; WITHOUT RECIPIENT NEPHRECTOMY;  Surgeon: Toledo, Lilyan Punt, MD;  Location: MAIN OR [facility];  Service: Transplant    TUBAL LIGATION  Family History   Problem Relation Age of Onset    Diabetes Mother     Alzheimer's disease Father     Diabetes Sister     Diabetes Sister     Diabetes Sister     Anesthesia problems Neg Hx     Bleeding Disorder Neg Hx         Allergies: Oxycodone, Gabapentin, Penicillins, and Valacyclovir                  Objective Findings  Precautions / Restrictions  Precautions: Falls precautions  Weight Bearing Status: Non-applicable  Required Braces or Orthoses: Non-applicable     Communication Preference: Verbal          Pain Comments: Patient denies pain  Medical Tests / Procedures: Labs, orders, and imaging reviewed in Epic.  Equipment / Environment: Vascular access (PIV, TLC, Port-a-cath, PICC), Telemetry     Vitals/Orthostatics : VSS per Epic; SpO2 97% on RA, HR 86 bpm with ambulation, pt asymptomatic     Living Situation  Living Environment: House  Lives With: Spouse  Home Living: One level home, Stairs to enter with rails, Tub/shower unit, Standard height toilet, Shower chair with back, Walk-in shower  Rail placement (outside): Rail on right side  Number of Stairs to Enter (outside): 3  Caregiver Identified?: Yes  Caregiver Availability: 24 hours (pt's husband occasionally runs errands)  Caregiver Ability: Limited lifting      Cognition: WFL  Cognition comment: Patient follows commands appropriately. Pt's husband reports mild deficits in her memory (not recent, however noticed following transplant)  Orientation: Oriented x4  Visual/Perception: Within Functional Limits  Hearing: Hearing aids present     Skin Inspection: Intact where visualized     Upper Extremities  UE ROM: Right WFL, Left WFL  UE Strength: Right WFL, Left WFL    Lower Extremities  LE ROM: Right WFL, Left WFL  LE Strength: Right WFL, Left WFL  LE comment: Grossly 4/5 strength for bilateral knee flexion and extension       Sensation: WFL  Posture: WFL    Static Sitting-Level of Assistance: Independent  Dynamic Sitting-Level of Assistance: Archivist Standing-Level of Assistance: Supervision  Dynamic Standing - Level of Assistance: Stand by assistance  Standing Balance comments: Pt noted to have increased lateral sway with ambulation, increased when adding dynamic tasks      Bed Mobility: Not assessed, pt received and ended session in bedside chair     Transfers: Sit to Stand  Sit to Stand assistance level: Standby assist, set-up cues, supervision of patient - no hands on  Transfer comments: Patient transfers sit to/from stand with SBA and no device, x3 reps performed during session      Gait Level of Assistance: Standby assist, set-up cues, supervision of patient - no hands on  Gait Distance Ambulated (ft): 110 ft  Skilled Treatment Performed: Patient ambulates 60 ft with RW and SBA, then additional 60 ft with no device and CGA for safety. Pt noted to have increased lateral sway without device, especially noted during conversation or dynamic tasks requiring head turns, however no episodes of LOB     Stairs: Patient performs standing hip flexion marches x5 reps each LE without UE support and SBA for safety (to simulate home environment)       Endurance: Patient reports deficits in endurance, with decreased tolerance to longer ambulation distances    Patient at end of session: All needs in reach, In chair, Friends/Family present,  Lines intact, Nurse notified      AM-PAC-6 click    Difficulty turning over In bed?: None - Modified Independent/Independent  Difficulty sitting down/standing up from chair with arms? : A Little - Minimal/Contact Guard Assist/Supervision  Difficulty moving from supine to sitting on edge of bed?: None - Modified Independent/Independent  Help moving to and from bed from wheelchair?: A Little - Minimal/Contact Guard Assist/Supervision  Help currently needed walking in a hospital room?: A Little - Minimal/Contact Guard Assist/Supervision  Help currently needed climbing 3-5 steps with railing?: A Little - Minimal/Contact Guard Assist/Supervision    Basic Mobility Score:  Basic Mobility Score 6 click: 20    6 click  Score (in points): % of Functional Impairment, Limitation, Restriction  6: 100% impaired, limited, restricted  7-8: At least 80%, but less than 100% impaired, limited restricted  9-13: At least 60%, but less than 80% impaired, limited restricted  14-19: At least 40%, but less than 60% impaired, limited restricted  20-22: At least 20%, but less than 40% impaired, limited restricted  23: At least 1%, but less than 20% impaired, limited restricted  24: 0% impaired, limited restricted      Physical Therapy Session Duration  PT Individual [mins]: 28     Medical Staff Made Aware: RN aware    I attest that I have reviewed the above information.  Signed: Heide Spark, PT  Filed 12/26/2022

## 2022-12-26 NOTE — Unmapped (Signed)
Alert and oriented, VSS on RA, denies pain. Telemetry and pulse ox on. Pt was febrile at 38.5, MD notified, Tylenol given x1 with good effect. BG monitored, no coverage given. Up to bedside commode with assist, free from falls and injury. Bed low and locked, call bell and side table within reach. Spouse at bedside.    Problem: Adult Inpatient Plan of Care  Goal: Plan of Care Review  Outcome: Progressing  Goal: Patient-Specific Goal (Individualized)  Outcome: Progressing  Goal: Absence of Hospital-Acquired Illness or Injury  Outcome: Progressing  Intervention: Identify and Manage Fall Risk  Recent Flowsheet Documentation  Taken 12/25/2022 2040 by Chauncy Passy, RN  Safety Interventions:   fall reduction program maintained   family at bedside   low bed  Intervention: Prevent Skin Injury  Recent Flowsheet Documentation  Taken 12/25/2022 2040 by Chauncy Passy, RN  Positioning for Skin: Other (Comment)  Device Skin Pressure Protection: absorbent pad utilized/changed  Skin Protection: adhesive use limited  Intervention: Prevent and Manage VTE (Venous Thromboembolism) Risk  Recent Flowsheet Documentation  Taken 12/25/2022 2040 by Chauncy Passy, RN  VTE Prevention/Management: ambulation promoted  Intervention: Prevent Infection  Recent Flowsheet Documentation  Taken 12/25/2022 2040 by Chauncy Passy, RN  Infection Prevention: hand hygiene promoted  Goal: Optimal Comfort and Wellbeing  Outcome: Progressing  Goal: Readiness for Transition of Care  Outcome: Progressing  Goal: Rounds/Family Conference  Outcome: Progressing     Problem: Self-Care Deficit  Goal: Improved Ability to Complete Activities of Daily Living  Outcome: Progressing     Problem: Fall Injury Risk  Goal: Absence of Fall and Fall-Related Injury  Outcome: Progressing  Intervention: Promote Injury-Free Environment  Recent Flowsheet Documentation  Taken 12/25/2022 2040 by Chauncy Passy, RN  Safety Interventions:   fall reduction program maintained   family at bedside   low bed     Problem: Wound  Goal: Optimal Coping  Outcome: Progressing  Goal: Optimal Functional Ability  Outcome: Progressing  Intervention: Optimize Functional Ability  Recent Flowsheet Documentation  Taken 12/25/2022 2040 by Chauncy Passy, RN  Activity Management: up to bedside commode  Goal: Absence of Infection Signs and Symptoms  Outcome: Progressing  Intervention: Prevent or Manage Infection  Recent Flowsheet Documentation  Taken 12/25/2022 2040 by Chauncy Passy, RN  Infection Management: aseptic technique maintained  Goal: Improved Oral Intake  Outcome: Progressing  Goal: Optimal Pain Control and Function  Outcome: Progressing  Goal: Skin Health and Integrity  Outcome: Progressing  Intervention: Optimize Skin Protection  Recent Flowsheet Documentation  Taken 12/25/2022 2040 by Chauncy Passy, RN  Activity Management: up to bedside commode  Pressure Reduction Techniques: frequent weight shift encouraged  Head of Bed (HOB) Positioning: HOB at 30-45 degrees  Pressure Reduction Devices: positioning supports utilized  Skin Protection: adhesive use limited  Goal: Optimal Wound Healing  Outcome: Progressing

## 2022-12-26 NOTE — Unmapped (Signed)
Nephrology (MEDB) Progress Note    Assessment & Plan:   Julie [name] is a 72 y.o. 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     Principal Problem:    Fever  Active Problems:    Type 2 diabetes mellitus with diabetic nephropathy, with long-term current use of insulin (CMS-HCC)    Hypothyroidism, acquired    UTI (urinary tract infection)    Delirium    Immunosuppressed status (CMS-HCC)    Hypertension    Hyperlipidemia    Anemia of chronic disease    Chronic idiopathic thrombocytopenia (CMS-HCC)    Thrombocytopenia (CMS-HCC)  Resolved Problems:    * No resolved hospital problems. *      Active Problems    Acute Fever in Immunocompromised Host - Hx of recurrent MDR UTIs and Bacteremia   Presenting with confusion, fever, and vomiting x1. Labs remarkable for UA with trace leukocytes, no leukocytosis (c/w prior presentations), normal lactate, negative RPP.  Had several recent admissions s/p DDKT for MDR UTI and ESBL E.coli bacteremia treated with IV levofloxacin in January and IV meropenem in March 2024 and April 2024. On UTI prophylaxis with nitrofurantoin, estradiol cream, cranberry, and d-mannose. Received empiric vancomycin and cefepime at OSH and transitioned to meropenem and continued vancomycin at [facility]. Of note, patient fevered overnight after starting vancomycin to 101.5 F. Likely urinary source with repeat UA at [facility] with moderate leukocytes, awaiting UCx. Legionella negative, awaiting additional infectious workup.   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 Elvera Maria. Lowest Cr ~0.95 after DDKT, presumed new baseline between 0.9-1.2. Cr 1.26 at baseline at OSH and stable at [facility] Cr 1.27. Tac level 5/28 at goal, Tac level 6/4 subtherapeutic. NAGMA with bicarb 17 resolved with oral bicarb to bicarb 21, pH 7.39.  - Continue Bicarb 650mg  TID  - Tacrolimus 1mg  BID  - Prednisone 5mg  daily  - HOLD Cellcept 500 mg BID in setting of fever and UTI  - Bactrim 400-80mg  MWF for PJP ppx  - Protonix 40mg  for PPI ppx  - Held ASA 81 mg for any intervention  - Daily RFP, Mg, Phos  - Strict I/Os    Acute on Chronic Thrombocytopenia  Known chronic thrombocytopenia ~110-130, acutely dropped to Plt 64 on 6/4. Obtaining blood smear and DIC profile. Of note, patient has had similar drop in platelets during previous admissions for bacteremia.  - F/up DIC  - F/up blood smear     Acute Metabolic Encephalopathy  Previous history of Hypoactive and Hyperactive delirium with bacteremia. Per daughter, patient is very affected by fevers. On admission, was oriented to time and place, but unable to complete orientation exams (days of the week, etc.). OSH CT head w/o contrast with no acute findings. On 6/4, was much more alert and could complete orientation exam (date & days of the week) without difficulty.  - Continue delirium precautions     Chronic Problems     HTN:   - Continued home amlodipine 5mg  BID  -  Continue home carvedilol 25mg  BID   - HOLD home chlorthalidone 25mg  BID  - Continue home hydralazine 50mg  BID  - Continue home spironolactone 25mg  daily     HLD: Continued home statin 10mg  daily     T2DM, insulin dependent: 12/07/22 HbA1C 5.8. On 6 units nightly Lantus and meal time insulin at home. Has not needed any insulin 6/3-6/4.  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 Hgb 8.8 at OSH. Hgb ~10 at previous admission.  - Iron panel and ferritin      Hypothyroidism: levothyroxine 112 mcg daily  Sleeping: melatonin 6mg  nightly      Issues Impacting Complexity of Management:  -Intensive monitoring of drug toxicity from Vancomycin with scheduled BMP and/or Vancomycin levels and Tacrolimus with scheduled levels  -The patient is at high risk from Hospital immobility in an elderly patient given baseline poor functional status with a high risk of causing delirium and further decline in function      Medical Decision Making: Reviewed records from the following unique sources  [facility].      Daily Checklist:  Diet: Regular Diet  DVT PPx: Heparin 5000units q8h  Electrolytes: Replete Potassium to >/=4 and Magnesium to >/=2  Code Status: Full Code  Dispo: Home    Team Contact Information:   Primary Team: Nephrology (MEDB)  Primary Resident: [name], MD  Resident's Pager: [phone] (Nephrology Intern - Alvester Morin)    Interval History:   No acute events overnight. Patient is much more alert today and inquiring to go home. Per prior admission notes and daughter, patient usually responds this way when she is feeling better. She does not have any physical complaints. Denies any dysuria, has not had BM in last day due to not eating anything yesterday. No diarrhea.    ROS: Denies headache, chest pain, shortness of breath, abdominal pain, nausea, vomiting.    Objective:   Temp:  [36.6 ??C (97.9 ??F)-38.6 ??C (101.5 ??F)] 37.1 ??C (98.8 ??F)  Heart Rate:  [63-80] 69  SpO2 Pulse:  [99] 99  Resp:  [16-19] 16  BP: (154-176)/(32-45) 154/32  SpO2:  [95 %-100 %] 100 %    Gen: NAD, converses, sitting upright   HENT: atraumatic, normocephalic  Heart: RRR, left arm AV fistula with palpable thrill, low flow murmur audible  Lungs: CTAB, no crackles or wheezes  Abdomen: soft, NTND, no CVA tenderness  Extremities: No edema    [name], MS4    I attest that I have reviewed the student note and that the components of the history of the present illness, the physical exam, and the assessment and plan documented, were performed by me or were performed in my presence, by the student, where I verified the documentation and performed (or re-performed) the exam and medical decision making.      Franchot Gallo, MD

## 2022-12-26 NOTE — Unmapped (Signed)
Social Work  Psychosocial Assessment    Patient Name: Julie [name]   Medical Record Number: [id number]   Date of Birth: 1950-07-26  Sex: Female     Referral  Referred by: Care Manager  Reason for Referral: Complex Discharge Planning    Extended Emergency Contact Information  Primary Emergency Contact: Super,Christie  Mobile Phone: [phone]  Relation: Daughter  Secondary Emergency Contact: Gwenlyn [place]  Mobile Phone: [phone]  Relation: Spouse    Legal Next of Kin / Guardian / POA / Advance Directives    HCDM (patient stated preference) (Active): Doiron,[name] - Spouse - [phone]    Advance Directive (Medical Treatment)  Does patient have an advance directive covering medical treatment?: Patient has advance directive covering medical treatment, copy not in chart.  Advance directive covering medical treatment not in Chart:: Copy requested from family    Health Care Decision Maker [HCDM] (Medical & Mental Health Treatment)  Healthcare Decision Maker: HCDM documented in the HCDM/Contact Info section.  Information offered on HCDM, Medical & Mental Health advance directives:: Patient declined information.    Advance Directive (Mental Health Treatment)  Does patient have an advance directive covering mental health treatment?: Patient has advance directive covering mental health treatment, copy not in chart.    Discharge Planning  Discharge Planning Information:   Type of Residence   Mailing Address:  [address] 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)     since July 2017    Hypertension     Hypothyroidism (acquired)     Kidney transplant status, cadaveric 08/08/2022    Type 2 diabetes mellitus (CMS-HCC)        Past Surgical History:   Procedure Laterality Date    CATARACT EXTRACTION HYSTERECTOMY      OOPHORECTOMY      PERITONEAL CATHETER INSERTION      PR LAP INSERTION TUNNELED INTRAPERITONEAL CATHETER N/A 09/27/2018    Procedure: LAPAROSCOPY, SURGICAL; WITH INSERTION OF INTRAPERITONEAL CANNULA OR CATHETER, PERMANENT;  Surgeon: Leona Carry, MD;  Location: MAIN OR [place];  Service: Transplant    PR LAP INSERTION TUNNELED INTRAPERITONEAL CATHETER N/A 04/08/2019    Procedure: LAPAROSCOPY, SURGICAL; WITH INSERTION OF INTRAPERITONEAL CANNULA OR CATHETER, PERMANENT;  Surgeon: Leona Carry, MD;  Location: MAIN OR [facility];  Service: Transplant    PR LAP REVISE INTRAPERITONEAL CATHETER N/A 08/26/2019    Procedure: LAPAROSCOPY, SURGICAL; W/REVIS PREV PLACED INTRAPERITONEAL CANNULA/CATH, REMOV INTRALUMIN OBSTRUCT MATERIAL;  Surgeon: Leona Carry, MD;  Location: MAIN OR [facility];  Service: Transplant    PR REDUCE VOLVULUS,INTUSS,INTERN HERNIA N/A 10/13/2022    Procedure: REDUCTION OF VOLVULUS, INTUSSUSCEPTION, INTERNAL HERNIA, BY LAPAROTOMY;  Surgeon: Suella Broad, MD;  Location: MAIN OR [place];  Service: Trauma    PR REMOVAL TUNNELED INTRAPERITONEAL CATHETER N/A 05/06/2018    Procedure: REMOVAL OF PERMANENT INTRAPERITONEAL CANNULA OR CATHETER;  Surgeon: Leona Carry, MD;  Location: MAIN OR [facility];  Service: Transplant    PR REMOVE PERITONEAL FOREIGN BODY N/A 04/08/2019    Procedure: Removal Of Peritoneal Of Foreign Body From Peritoneal Cavity;  Surgeon: Leona Carry, MD;  Location: MAIN OR [facility];  Service: Transplant    PR TRANSPLANT,PREP RENAL GRAFT/ARTERIAL Right 08/06/2022    Procedure: [facility]  RECONSTRUCTION CADAVER/LIVING DONOR RENAL ALLOGRAFT PRIOR TO TRANSPLANT; ARTERIAL ANASTOMOSIS EAC;  Surgeon: Toledo, Lilyan Punt, MD;  Location: MAIN OR [facility];  Service: Transplant    PR TRANSPLANTATION OF KIDNEY Right 08/06/2022    Procedure: RENAL ALLOTRANSPLANTATION, IMPLANTATION OF GRAFT; WITHOUT RECIPIENT NEPHRECTOMY;  Surgeon: Toledo, Lilyan Punt, MD;  Location: MAIN OR [facility];  Service: Transplant    TUBAL LIGATION         Family History   Problem Relation Age of Onset    Diabetes Mother     Alzheimer's disease Father     Diabetes Sister     Diabetes Sister     Diabetes Sister     Anesthesia problems Neg Hx     Bleeding Disorder Neg Hx        Network engineer Insurance: Payor: HUMANA MEDICARE ADV / Plan: HUMANA GOLD PLUS HMO / Product Type: *No Product type* /    Secondary Insurance: None   Prescription Coverage: Medicare D     Preferred Pharmacy: [id number] - [place],  - [id number]  [facility]  [facility]  [facility]    Barriers to taking medication:  needs assistance organizing it     Transition Home   Transportation at time of discharge: Family/Friend's Private Vehicle    Anticipated changes related to Illness: none   Services in place prior to admission: N/A   Services anticipated for DC:  TBD   Hemodialysis Prior to Admission: No    Readmission  Risk of Unplanned Readmission Score: UNPLANNED READMISSION SCORE: 36.46%  Readmitted Within the Last 30 Days?   Readmission Factors include: current reason for admission unrelated to previous admission    Social Determinants of Health  Social Determinants of Health were addressed in provider documentation.  Please refer to patient history.    Social History  Support Systems/Concerns: Child psychotherapist Service: No Marine scientist and Psychiatric History  Psychosocial Stressors: Coping with health challenges/recent hospitalization      Psychological Issues/Information: Cognitive impairment suspected      Chemical Dependency: None        Outpatient Providers: Specialist   Name / Contact #: : [place] [facility]  Legal: No legal issues      Ability to [employer]: No issues accessing community services             Care Management  Complex Discharge Assessment              Complex Discharge Information    Is patient identified as a difficult/complex discharge?: Yes    Reason patient is identified as difficult/complex discharge :  (kidney txp within one year)    Referrals:  (txp nephrology)    Interventions:   (txp CM/SW)

## 2022-12-26 NOTE — Unmapped (Signed)
Vancomycin Therapeutic Monitoring Pharmacy Note    Julie [name] is a 72 y.o. female starting vancomycin. Date of therapy initiation: 12/25/22    Indication: Bacteremia/Sepsis    Prior Dosing Information:  Received 1000 mg IV x1 at OSH on 12/25/22 at 0554      Goals:  Therapeutic Drug Levels  Vancomycin trough goal: 10-15 mg/L    Additional Clinical Monitoring/Outcomes  Renal function, volume status (intake and output)    Results: Vancomycin level 7.0 mg/L, drawn appropriately ~24 hours after dose    Wt Readings from Last 1 Encounters:   12/25/22 55.3 kg (121 lb 14.6 oz)     Creatinine   Date Value Ref Range Status   12/26/2022 1.27 (H) 0.55 - 1.02 mg/dL Final   64/40/3474 2.59 (H) 0.57 - 1.00 mg/dL Final   56/38/7564 3.32 (H) 0.57 - 1.00 mg/dL Final        Pharmacokinetic Considerations and Significant Drug Interactions:  Per linear dose adjustment  Concurrent nephrotoxic meds: not applicable    Assessment/Plan:  Recommendation(s)  Give a 1x dose of vanc 1000 mg IV   Estimated trough on recommended regimen: Not applicable - dosing by level    Follow-up  Level due: tomorrow with AM labs  A pharmacist will continue to monitor and order levels as appropriate    Please page service pharmacist with questions/clarifications.    [place] M Sarahmarie Leavey, PharmD

## 2022-12-26 NOTE — Unmapped (Signed)
IMMUNOCOMPROMISED HOST INFECTIOUS DISEASE CONSULT NOTE    Julie [name] is being seen in consultation at the request of Nelva Bush, MBBS for evaluation of sepsis in transplant patient.      Assessment/Recommendations:    Julie [name] is a 72 y.o. 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 Had perinephric drain in place postoperatively, removed 08/30/22  - Immunosuppression: Tacrolimus (goal 6-8), MMF 500 BID, Pred 5  - Prophylaxis: valganciclovir x 3 months (mod risk, completed), TMP+SMX x 6 months  Estimated Creatinine Clearance: 30.8 mL/min (A) (based on SCr of 1.27 mg/dL (H)).       Pertinent comorbidities:  ESRD was on PD and HD  DM II (A1c 5.6 on 08/06/22)  CAD  HLD  SBO 10/07/22 s/p ex lap 10/13/22 w/ lysis of adhesions     Pertinent exposures:  Originally from [place]   Treated with ivermectin at the ID clinic in 2018  Increased amount of gardening and outdoor exposures over past several months     Summary of pertinent prior infections:  #History of Shingles 2017  #04/2018 Dialysis fluid [place] R: Ampicillin, Unasyn, Cefazolin,S: Ceftriaxone, Gentamicin, Levo, PipTazo, Tobra  #Thrush w/presumed candidal esophagitis 10/10/22 s/p 14 day treatment fluconazole  #Donor urine cx with C. Dubliensis 08/05/22, <10K, negative recipient samples, negative donor blood cx, treated with 2 weeks PO fluconazole  # Recurrent MDR E.Coli Bacteremia/UTI  - 1/29 BCx/UCx E. Coli - s/p Levo x 3 weeks (thru stent removal, 3/15) (S: Cefazolin, Cipro/Levo, Pip-tazo, Cephalexin; R: TMP-SMX, Amp, Amp-sulb)  - 3/14 BCx/UCx ESBL/FQ-R E. Coli s/p 14 days Meropenem; also with perinephric fluid collection; (S: Erta, Gent, Mero, Tobra, Cefepime SDD; I: ceftaz; R: Cipro/Levo, Cefazolin, Ceftriaxone, Amp)  - 4/13 Bcx/Ucx E. Coli (R CFZ, CTX, cipro/levo, I ceftaz, SDD CFP, susc nitro). Treated vanc/mero -> mero x 2 weeks  - 4/29 started on nitrofurantoin prophylaxis  - 5/7 seen by urogyn, no structural issues planned to stop nitrofurantoin with switch to methenamine (not done), added vaginal estrogen/d-mannose.    #Perinephric fluid collection 10/05/22  - 3/19 IR guided drainage negative cx, Cr 1.5 not suggestive of urinoma  - 4/13 increased stranding, 4/16 not amenable to intervention per VIR     Active infection:  # Fever and nausea/vomiting concerning for recurrent UTI despite nitrofurantoin prophylaxis 12/25/22  - 12/25/22 presented to [place] with fever, nausea/vomiting. 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  - 06/06/22 hepB surface Ab positive, surface Ag neg  - 08/11/22 HBV DNA not detected  - 09/06/22 HBV DNA not detected     Antimicrobial allergy/intolerance:   Penicillin (see allergy list for nuances)  Valacyclovir - nausea  RECOMMENDATIONS    Diagnosis  Follow-up OSH ([facility]) Bcx from 12/25/22  Follow-up Ucx 12/25/22 - <1000 CFU but pending further workup (noted to be GNRs)  Follow-up Bcx 12/25/22  Follow-up EBV PCR, serum CrAg    Management  Continue IV meropenem, renally dosed equivalent 1g q8h (q12h)  Stop vancomycin    Antimicrobial prophylaxis required for host deficiency: transplant immunosuppression  Viral: Completed valcyte x 3 months  PJP: Continue TMP+SMX renally dosed equivalent 400/80 MWF x 6 months  UTI ppx: Holding nitrofurantoin ppx with above regimen, pending repeat cx    Intensive toxicity monitoring for prescription antimicrobials   CBC w/diff at least once per week  CMP at least once per week  clinical assessments for rashes or other skin changes  Given hepB core+, needs LFTs q49m for first year, HBSAg and HBV DNA q12m for first year and with any rise in LFTs     The [facility] ID service will continue to follow.           Please page the ID Transplant/Liquid Oncology Fellow consult at [phone] with questions.  Patient discussed with Dr. Reynold Bowen.    [name], MD  [facility] Division of Infectious Diseases    History of Present Illness:      External record(s): Primary team note: Awaiting cx from OSH [facility]), continues on broad-spectrum for now .    Independent historian(s): no independent historian required. Daughter at bedside, confirms timeline of symptoms as reported below       23F with hx DDKT 07/2022 CMV +/+ and recurrent MDR E. Coli UTI/bacteremias since transplant (last 10/2022 treated with 2 weeks meropenem) on nitrofurantoin prophylaxis, who presented to OSH with altered mental status. Patient's prior infections summarized above in problem list and prior ID notes, briefly has had 3 episodes of UTI with bacteremia with E. Coli 07/2022, 09/2022, and 10/2022. Workups have also revealed a perinephric fluid collection not amenable to further intervention/drainage at last hospitalization 10/2022. Started on nitrofurantoin prophylaxis at ID visit 11/20/22. Seen by urogynecology on 11/28/22, planned to stop nitofurantoin and switch to methenamine with addition of D-mannose and topical estrogen, however this plan was changed given interaction between TMP/SMX and methanamine and pt was continued on nitrofurantoin. Pt was otherwise doing well and tolerating antibiotic prophylaxis until evening 12/24/22 when she developed acute onset of fever, nausea/vomiting, and shortness of breath while in bed. No pain over kidney, dysuria/burning, diarrhea, URI symptoms, headaches, rashes, cough. Also noted to be somewhat confused by family. Sent to [facility] hospital, UA collected there with 0-5 WBC and no Ucx done, Bcx collected. Started on Vanc/CFP, transitioned to Vanc/Mero. Temp to 38.6 at transfer to [facility]. Since transfer to [facility], pt feeling better, has some appetite and no further fevers. Daughter at bedside    Allergies:  Allergies   Allergen Reactions    Oxycodone Nausea And Vomiting     Per Pt cannot tolerate oxycodone    Gabapentin Other (See Comments)     Shakes    Penicillins Other (See Comments)     patient denies PCN allergy and states she had formal allergy testing that ruled out PCN allergy. 09/27/18    Prior allergy comment states hypotension and rash with PCN, patient does not recall this    Valacyclovir Nausea Only and Other (See Comments)       Medications:   Antimicrobials:  Anti-infectives (From admission, onward)      Start     Dose/Rate Route Frequency Ordered Stop  12/26/22 0900  meropenem (MERREM) 1 g in sodium chloride 0.9 % (NS) 100 mL IVPB-MBP         1 g  200 mL/hr over 30 Minutes Intravenous Every 12 hours scheduled 12/26/22 0753 01/01/23 2059    12/26/22 0800  vancomycin (VANCOCIN) IVPB 1000 mg (premix)         1,000 mg  over 60 Minutes Intravenous Once 12/26/22 0743      12/25/22 1700  sulfamethoxazole-trimethoprim (BACTRIM) 400-80 mg tablet 80 mg of trimethoprim         1 tablet Oral Every Mon-Wed-Fri 12/25/22 1600 04/02/23 0859    12/25/22 1601  Vancomycin - Pharmacy dosing by levels          Other Pharmacy Dosing 12/25/22 1602              Current/Prior immunomodulators per problem list. No change since admission.    Other medications reviewed.     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)     since July 2017    Hypertension     Hypothyroidism (acquired)     Kidney transplant status, cadaveric 08/08/2022    Type 2 diabetes mellitus (CMS-HCC)      No additional immunocompromising condition except as above.    Past Surgical History:   Procedure Laterality Date    CATARACT EXTRACTION      HYSTERECTOMY      OOPHORECTOMY      PERITONEAL CATHETER INSERTION      PR LAP INSERTION TUNNELED INTRAPERITONEAL CATHETER N/A 09/27/2018    Procedure: LAPAROSCOPY, SURGICAL; WITH INSERTION OF INTRAPERITONEAL CANNULA OR CATHETER, PERMANENT;  Surgeon: Leona Carry, MD;  Location: MAIN OR [place];  Service: Transplant    PR LAP INSERTION TUNNELED INTRAPERITONEAL CATHETER N/A 04/08/2019    Procedure: LAPAROSCOPY, SURGICAL; WITH INSERTION OF INTRAPERITONEAL CANNULA OR CATHETER, PERMANENT;  Surgeon: Leona Carry, MD;  Location: MAIN OR [facility];  Service: Transplant    PR LAP REVISE INTRAPERITONEAL CATHETER N/A 08/26/2019    Procedure: LAPAROSCOPY, SURGICAL; W/REVIS PREV PLACED INTRAPERITONEAL CANNULA/CATH, REMOV INTRALUMIN OBSTRUCT MATERIAL;  Surgeon: Leona Carry, MD;  Location: MAIN OR [facility];  Service: Transplant    PR REDUCE VOLVULUS,INTUSS,INTERN HERNIA N/A 10/13/2022    Procedure: REDUCTION OF VOLVULUS, INTUSSUSCEPTION, INTERNAL HERNIA, BY LAPAROTOMY;  Surgeon: Suella Broad, MD;  Location: MAIN OR ;  Service: Trauma    PR REMOVAL TUNNELED INTRAPERITONEAL CATHETER N/A 05/06/2018    Procedure: REMOVAL OF PERMANENT INTRAPERITONEAL CANNULA OR CATHETER;  Surgeon: Leona Carry, MD;  Location: MAIN OR [facility];  Service: Transplant    PR REMOVE PERITONEAL FOREIGN BODY N/A 04/08/2019    Procedure: Removal Of Peritoneal Of Foreign Body From Peritoneal Cavity;  Surgeon: Leona Carry, MD;  Location: MAIN OR [facility];  Service: Transplant    PR TRANSPLANT,PREP RENAL GRAFT/ARTERIAL Right 08/06/2022    Procedure: [facility] RECONSTRUCTION CADAVER/LIVING DONOR RENAL ALLOGRAFT PRIOR TO TRANSPLANT; ARTERIAL ANASTOMOSIS EAC;  Surgeon: Toledo, Lilyan Punt, MD; Location: MAIN OR [facility];  Service: Transplant    PR TRANSPLANTATION OF KIDNEY Right 08/06/2022    Procedure: RENAL ALLOTRANSPLANTATION, IMPLANTATION OF GRAFT; WITHOUT RECIPIENT NEPHRECTOMY;  Surgeon: Toledo, Lilyan Punt, MD;  Location: [facility];  Service: Transplant    TUBAL LIGATION         Social History:  Tobacco use:  reports that she quit smoking about 16 years ago. Her smoking use included cigarettes. She started smoking about 46 years ago. She has a 22.5 pack-year smoking history. She has quit using smokeless tobacco.   Alcohol use:    reports current alcohol use.   Drug use:    reports no history of drug use.   Living situation:  Lives with family   [place]idence:   [place]     [place] travel:   No [place] travel outside of [employer] travel:   No travel outside of the [employer] service:     Employment:     Pets and animal exposure:     Insect exposure:     Hobbies:  Gardens regularly, has been spending more time outside   TB exposures:  No known TB exposure   Sexual history:  Not sexually active   Other significant exposures:  No exposure to well water and No exposure to young children     Family History:  Family History   Problem Relation Age of Onset    Diabetes Mother     Alzheimer's disease Father     Diabetes Sister     Diabetes Sister     Diabetes Sister     Anesthesia problems Neg Hx     Bleeding Disorder Neg Hx             Vital Signs last 24 hours:  Temp:  [36.6 ??C (97.9 ??F)-38.6 ??C (101.5 ??F)] 37.1 ??C (98.8 ??F)  Heart Rate:  [63-80] 69  SpO2 Pulse:  [99] 99  Resp:  [16-19] 16  BP: (154-176)/(32-45) 154/32  MAP (mmHg):  [68-83] 68  SpO2:  [95 %-100 %] 100 %  BMI (Calculated):  [23.81-23.83] 23.81    Physical Exam:  Patient Lines/Drains/Airways Status       Active Active Lines, Drains, & Airways       Name Placement date Placement time Site Days    Peripheral IV 12/25/22 Right Antecubital 12/25/22  0800  Antecubital  1    Peripheral IV 12/25/22 Posterior;Right Hand 12/25/22  0800  Hand  1    Arteriovenous Fistula - Vein Graft  Access 08/24/16 Left;Upper Arm 08/24/16  --  Arm  2315                    Const [x]  vital signs above    [x]  NAD, non-toxic appearance []  Chronically ill-appearing, non-distressed  Sitting comfortably in bed, no distress      Eyes [x]  Lids normal bilaterally, conjunctiva anicteric and noninjected OU     [] PERRL  [] EOMI        ENMT []  Normal appearance of external nose and ears, no nasal discharge        [x]  MMM, no lesions on lips or gums []  No thrush, leukoplakia, oral lesions  []  Dentition good []  Edentulous []  Dental caries present  []  Hearing normal  []  TMs with good light reflexes bilaterally   Dentures in place      Neck [x]  Neck of normal appearance and trachea midline        []  No thyromegaly, nodules, or tenderness   []  Full neck ROM        Lymph [x]  No LAD in neck     []  No LAD in supraclavicular area     []  No LAD in axillae   []  No LAD in epitrochlear chains     []   No LAD in inguinal areas        CV [x]  RRR            []  No peripheral edema     []  Pedal pulses intact   [x]  No abnormal heart sounds appreciated   []  Extremities WWP         Resp [x]  Normal WOB at rest    []  No breathlessness with speaking, no coughing  [x]  CTA anteriorly    []  CTA posteriorly          GI [x]  Normal inspection, NTND   []  NABS     []  No umbilical hernia on exam       []  No hepatosplenomegaly     []  Inspection of perineal and perianal areas normal  No pain over transplant kidney      GU []  Normal external genitalia     [] No urinary catheter present in urethra   []  No CVA tenderness    []  No tenderness over renal allograft        MSK []  No clubbing or cyanosis of hands       [x]  No vertebral point tenderness  [x]  No focal tenderness or abnormalities on palpation of joints in RUE, LUE, RLE, or LLE        Skin [x]  No rashes, lesions, or ulcers of visualized skin     []  Skin warm and dry to palpation         Neuro [x]  Face expression symmetric  []  Sensation to light touch grossly intact throughout    [x]  Moves extremities equally    []  No tremor noted        []  CNs II-XII grossly intact     []  DTRs normal and symmetric throughout []  Gait unremarkable        Psych [x]  Appropriate affect       []  Fluent speech         []  Attentive, good eye contact  [x]  Oriented to person, place, time          []  Judgment and insight are appropriate           Data for Medical Decision Making     (6/3) EKG QTcF 413    I discussed mgm't w/qualified health care professional(s) involved in case: primary team regarding recs and micro and/or path w/lab personnel [facility] regarding Bcx - no updates .    I reviewed CBC results (WBC 7.3, stable. ALC 500), chemistry results (Cr 1.27, stable for pt), micro result(s) (UA here 34 WBC, Ucx <1000. Bcx 6/3 at [place] NGTD), and radiology report(s) (CT A/P from OSH with reported perinephric edema without fluid collection).    I independently visualized/interpreted not done.       Recent Labs     Units 12/26/22  0548   WBC 10*9/L 7.3   HGB g/dL 9.0*   PLT 16*1/W 64*   NEUTROABS 10*9/L 6.2   LYMPHSABS 10*9/L 0.5*   EOSABS 10*9/L 0.0   NA mmol/L 137   K mmol/L 4.7   BUN mg/dL 43*   CREATININE mg/dL 9.60*   GLU mg/dL 454   CALCIUM mg/dL 9.4   MG mg/dL 1.9   PHOS mg/dL 3.9       Lab Results   Component Value Date    Tacrolimus, Trough 6.3 12/07/2022    Tacrolimus Lvl 3.6 12/20/2022       Microbiology:  Microbiology Results (last day)  Procedure Component Value Date/Time Date/Time    Blood Culture, Adult [[id number]] Collected: 12/25/22 2110    Lab Status: In process Specimen: Blood from 1 Peripheral Draw Updated: 12/25/22 2133    Blood Culture, Adult [[id number]] Collected: 12/25/22 2110    Lab Status: In process Specimen: Blood from 1 Peripheral Draw Updated: 12/25/22 2133    Urine Culture [[id number]] Collected: 12/25/22 1747    Lab Status: In process Specimen: Urine from Clean Catch Updated: 12/25/22 1808 Imaging:  ECG 12 Lead    Result Date: 12/25/2022  NORMAL SINUS RHYTHM ST & T WAVE ABNORMALITY, CONSIDER LATERAL ISCHEMIA ABNORMAL ECG WHEN COMPARED WITH ECG OF 04-Nov-2022 20:45, ST MORE DEPRESSED IN LATERAL LEADS NONSPECIFIC T WAVE ABNORMALITY, IMPROVED IN INFERIOR LEADS    US Renal Transplant W Doppler    Result Date: 12/25/2022  EXAM: US RENAL TRANSPLANT [place] ACCESSION: [id number] UN     CLINICAL INDICATION: 72 years old with fever in renal transplant     COMPARISON: Renal transplant ultrasound 11/05/2022, 10/05/2022     TECHNIQUE:  Ultrasound views of the renal transplant were obtained using gray scale and color and spectral Doppler imaging. Views of the urinary bladder were obtained using gray scale and limited color Doppler imaging.     FINDINGS:     TRANSPLANTED KIDNEY: The renal transplant was located in the left lower quadrant. Normal size and echogenicity. Parapelvic cyst or dilated calyx within the upper pole measuring approximately 1.0 x 0.8 x 0.9 cm, unchanged. No solid masses or calculi. No perinephric collections identified. No hydronephrosis.     VESSELS: - Perfusion: Using power Doppler, normal perfusion was seen throughout the renal parenchyma. - Resistive indices in the renal transplant are increased compared with prior examination, particularly within the main renal artery. Slightly tardus parvus appearance in the segmental arteries, similar to prior. - Main renal artery/iliac artery: Patent - Main renal vein/iliac vein: Patent     BLADDER: Mildly distended, unremarkable.         *  Slightly increased elevated resistive indices within the main renal artery. *  Stable mildly elevated resistive indices within the segmental arteries. *  Elevated velocities at the MRA anastomosis, similar to prior.     Please see below for data measurements:     Transplant location:   LLQ     Renal Transplant: Sagittal   12.86  cm; AP 6.13 cm; Transverse 6.79 cm     Segmental artery superior resistive index: 0.85  Segmental artery mid resistive index:  0.86  Segmental artery inferior resistive index:  0.85      Previous resistive indices range of segmental arteries:   (0.81-0.84)     Main renal artery peak systolic velocity at anastomosis:   3.58  m/s, previously 3.2 m/s Main renal artery hilum resistive index:   0.90 Main renal artery mid resistive index:  0.93  Main renal artery anastomosis resistive index:   0.94     Previous resistive indices range of main renal artery:  (0.80-0.90)      Main renal vein: patent     Iliac artery: Patent Iliac vein: Patent     Bladder volume prevoid:   90.1   mL                     XR Chest Portable    Result Date: 12/25/2022  EXAM: XR CHEST PORTABLE ACCESSION: [id number] UN     CLINICAL INDICATION: Fever.     TECHNIQUE: Single View AP Chest Radiograph.  COMPARISON: April 16.     FINDINGS:     Clear lungs with no pleural fluid or pneumothorax. Stable cardiomediastinal contour with calcified aorta. Vascular stent on the left.        No acute abnormalities.     Serologies:  Lab Results   Component Value Date    CMV IGG Positive (A) 08/06/2022    EBV VCA IgG Antibody Positive (A) 08/06/2022    HIV Antigen/Antibody Combo Nonreactive 08/06/2022    Hep A IgG Reactive (A) 06/06/2022    Hep B Surface Ag Nonreactive 08/06/2022    Hep B S Ab Reactive (A) 08/06/2022    Hep B Surf Ab Quant 265.82 (H) 08/06/2022    Hep B Core Total Ab Reactive (A) 08/06/2022    Hepatitis C Ab Nonreactive 08/06/2022    RPR Nonreactive 08/06/2022    HSV 1 IgG Positive (A) 08/06/2022    HSV 2 IgG Negative 08/06/2022    Varicella IgG Positive 08/06/2022    Rubella IgG Scr Positive 06/06/2022    Toxoplasma Gondii IgG Negative 06/06/2022    Quantiferon TB Gold Plus Interpretation Negative 06/06/2022    Quantiferon Mitogen Minus Nil 9.92 06/06/2022    Quantiferon Antigen 1 minus Nil 0.17 06/06/2022       Immunizations:  Immunization History   Administered Date(s) Administered    COVID-19 VAC,BIVALENT(13YR UP),PFIZER 09/04/2019, 09/25/2019    COVID-19 VAC,MRNA,TRIS(12Y UP)(PFIZER)(GRAY CAP) 09/04/2019, 09/25/2019, 04/29/2020, 11/27/2020    COVID-19 VACC,MRNA,(PFIZER)(PF) 09/04/2019, 09/25/2019, 04/29/2020    Covid-19 Vacc, Unspecified 09/04/2019, 09/04/2019, 09/25/2019, 09/25/2019    HEPATITIS B VACCINE ADULT,IM(ENERGIX B, RECOMBIVAX) 04/27/2017    Hepatitis B Vaccine, Unspecified Formulation 04/27/2017    INFLUENZA INJ MDCK PF, QUAD,(FLUCELVAX)(7MO AND UP EGG FREE) 06/29/2017    INFLUENZA QUAD HIGH DOSE 41YRS+(FLUZONE) 04/05/2016, 04/27/2017, 05/31/2018, 04/10/2019    Influenza Virus Vaccine, unspecified formulation 04/24/2015, 05/26/2015, 04/05/2016, 04/05/2016, 04/27/2017, 04/27/2017, 05/31/2018, 05/31/2018, 04/10/2019, 04/10/2019, 05/09/2019, 04/29/2020, 05/09/2021, 05/09/2021, 04/24/2022, 04/26/2022    PNEUMOCOCCAL POLYSACCHARIDE 23-VALENT 07/09/2015    PPD Test 12/19/2015, 10/24/2016, 11/26/2017, 03/08/2019, 01/26/2020, 12/22/2020, 01/04/2022    Pneumococcal Conjugate 13-Valent 07/31/2018, 08/02/2018    Pneumococcal vaccine, Unspecified Formulation 06/28/2016, 08/26/2021    Pneumococcal, Unspecified Formulation 08/26/2021    TdaP 09/25/2016

## 2022-12-27 LAB — RENAL FUNCTION PANEL
ALBUMIN: 3 g/dL — ABNORMAL LOW (ref 3.4–5.0)
ANION GAP: 3 mmol/L — ABNORMAL LOW (ref 5–14)
BLOOD UREA NITROGEN: 47 mg/dL — ABNORMAL HIGH (ref 9–23)
BUN / CREAT RATIO: 35
CALCIUM: 8.8 mg/dL (ref 8.7–10.4)
CHLORIDE: 108 mmol/L — ABNORMAL HIGH (ref 98–107)
CO2: 24 mmol/L (ref 20.0–31.0)
CREATININE: 1.35 mg/dL — ABNORMAL HIGH
EGFR CKD-EPI (2021) FEMALE: 42 mL/min/{1.73_m2} — ABNORMAL LOW (ref >=60)
GLUCOSE RANDOM: 163 mg/dL (ref 70–179)
PHOSPHORUS: 3.1 mg/dL (ref 2.4–5.1)
POTASSIUM: 4.5 mmol/L (ref 3.4–4.8)
SODIUM: 135 mmol/L (ref 135–145)

## 2022-12-27 LAB — CBC W/ AUTO DIFF
BASOPHILS ABSOLUTE COUNT: 0 10*9/L (ref 0.0–0.1)
BASOPHILS RELATIVE PERCENT: 0.3 %
EOSINOPHILS ABSOLUTE COUNT: 0.1 10*9/L (ref 0.0–0.5)
EOSINOPHILS RELATIVE PERCENT: 1.7 %
HEMATOCRIT: 23.3 % — ABNORMAL LOW (ref 34.0–44.0)
HEMOGLOBIN: 8.1 g/dL — ABNORMAL LOW (ref 11.3–14.9)
LYMPHOCYTES ABSOLUTE COUNT: 0.5 10*9/L — ABNORMAL LOW (ref 1.1–3.6)
LYMPHOCYTES RELATIVE PERCENT: 11.2 %
MEAN CORPUSCULAR HEMOGLOBIN CONC: 34.7 g/dL (ref 32.0–36.0)
MEAN CORPUSCULAR HEMOGLOBIN: 33.4 pg — ABNORMAL HIGH (ref 25.9–32.4)
MEAN CORPUSCULAR VOLUME: 96.3 fL — ABNORMAL HIGH (ref 77.6–95.7)
MEAN PLATELET VOLUME: 8.7 fL (ref 6.8–10.7)
MONOCYTES ABSOLUTE COUNT: 0.4 10*9/L (ref 0.3–0.8)
MONOCYTES RELATIVE PERCENT: 8.9 %
NEUTROPHILS ABSOLUTE COUNT: 3.4 10*9/L (ref 1.8–7.8)
NEUTROPHILS RELATIVE PERCENT: 77.9 %
PLATELET COUNT: 71 10*9/L — ABNORMAL LOW (ref 150–450)
RED BLOOD CELL COUNT: 2.42 10*12/L — ABNORMAL LOW (ref 3.95–5.13)
RED CELL DISTRIBUTION WIDTH: 13.6 % (ref 12.2–15.2)
WBC ADJUSTED: 4.3 10*9/L (ref 3.6–11.2)

## 2022-12-27 LAB — MAGNESIUM: MAGNESIUM: 1.8 mg/dL (ref 1.6–2.6)

## 2022-12-27 LAB — PERIPHERAL BLOOD SMEAR, PATH REVIEW

## 2022-12-27 LAB — TACROLIMUS LEVEL, TROUGH: TACROLIMUS, TROUGH: 7.7 ng/mL (ref 5.0–15.0)

## 2022-12-27 MED ADMIN — sodium bicarbonate tablet 650 mg: 650 mg | ORAL | @ 19:00:00

## 2022-12-27 MED ADMIN — pantoprazole (Protonix) EC tablet 40 mg: 40 mg | ORAL | @ 14:00:00

## 2022-12-27 MED ADMIN — insulin lispro (HumaLOG) injection 0-20 Units: 0-20 [IU] | SUBCUTANEOUS | @ 16:00:00

## 2022-12-27 MED ADMIN — amlodipine (NORVASC) tablet 5 mg: 5 mg | ORAL | @ 01:00:00

## 2022-12-27 MED ADMIN — hydrALAZINE (APRESOLINE) tablet 50 mg: 50 mg | ORAL | @ 14:00:00

## 2022-12-27 MED ADMIN — carvedilol (COREG) tablet 25 mg: 25 mg | ORAL | @ 01:00:00

## 2022-12-27 MED ADMIN — predniSONE (DELTASONE) tablet 5 mg: 5 mg | ORAL | @ 14:00:00

## 2022-12-27 MED ADMIN — insulin lispro (HumaLOG) injection 0-20 Units: 0-20 [IU] | SUBCUTANEOUS | @ 22:00:00

## 2022-12-27 MED ADMIN — carvedilol (COREG) tablet 25 mg: 25 mg | ORAL | @ 14:00:00

## 2022-12-27 MED ADMIN — spironolactone (ALDACTONE) tablet 25 mg: 25 mg | ORAL | @ 14:00:00

## 2022-12-27 MED ADMIN — hydrALAZINE (APRESOLINE) tablet 50 mg: 50 mg | ORAL | @ 01:00:00

## 2022-12-27 MED ADMIN — melatonin tablet 6 mg: 6 mg | ORAL | @ 22:00:00

## 2022-12-27 MED ADMIN — meropenem (MERREM) 1 g in sodium chloride 0.9 % (NS) 100 mL IVPB-MBP: 1 g | INTRAVENOUS | @ 01:00:00 | Stop: 2023-01-01

## 2022-12-27 MED ADMIN — heparin (porcine) 5,000 unit/mL injection 5,000 Units: 5000 [IU] | SUBCUTANEOUS | @ 03:00:00

## 2022-12-27 MED ADMIN — heparin (porcine) 5,000 unit/mL injection 5,000 Units: 5000 [IU] | SUBCUTANEOUS | @ 19:00:00

## 2022-12-27 MED ADMIN — meropenem (MERREM) 500 mg in sodium chloride 0.9 % (NS) 100 mL IVPB-MBP: 500 mg | INTRAVENOUS | @ 14:00:00 | Stop: 2023-01-01

## 2022-12-27 MED ADMIN — atorvastatin (LIPITOR) tablet 10 mg: 10 mg | ORAL | @ 01:00:00

## 2022-12-27 MED ADMIN — heparin (porcine) 5,000 unit/mL injection 5,000 Units: 5000 [IU] | SUBCUTANEOUS | @ 10:00:00

## 2022-12-27 MED ADMIN — amlodipine (NORVASC) tablet 5 mg: 5 mg | ORAL | @ 14:00:00

## 2022-12-27 MED ADMIN — sulfamethoxazole-trimethoprim (BACTRIM) 400-80 mg tablet 80 mg of trimethoprim: 1 | ORAL | @ 14:00:00 | Stop: 2023-04-02

## 2022-12-27 MED ADMIN — magnesium oxide-Mg AA chelate (Magnesium Plus Protein) 1 tablet: 1 | ORAL | @ 14:00:00

## 2022-12-27 MED ADMIN — tacrolimus (PROGRAF) capsule 1 mg: 1 mg | ORAL | @ 14:00:00

## 2022-12-27 MED ADMIN — polyethylene glycol (MIRALAX) packet 17 g: 17 g | ORAL | @ 14:00:00

## 2022-12-27 MED ADMIN — tacrolimus (PROGRAF) capsule 1 mg: 1 mg | ORAL | @ 01:00:00

## 2022-12-27 MED ADMIN — magnesium oxide-Mg AA chelate (Magnesium Plus Protein) 1 tablet: 1 | ORAL | @ 01:00:00

## 2022-12-27 MED ADMIN — sodium bicarbonate tablet 650 mg: 650 mg | ORAL | @ 14:00:00

## 2022-12-27 MED ADMIN — levothyroxine (SYNTHROID) tablet 112 mcg: 112 ug | ORAL | @ 10:00:00

## 2022-12-27 MED ADMIN — sodium bicarbonate tablet 650 mg: 650 mg | ORAL | @ 01:00:00

## 2022-12-27 NOTE — Unmapped (Signed)
IMMUNOCOMPROMISED HOST INFECTIOUS DISEASE PROGRESS NOTE    Assessment/Plan:     Julie [name] is a 72 y.o. 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 Had perinephric drain in place postoperatively, removed 08/30/22  - Immunosuppression: Tacrolimus (goal 6-8), MMF 500 BID, Pred 5  - Prophylaxis: valganciclovir x 3 months (mod risk, completed), TMP+SMX x 6 months  Estimated Creatinine Clearance: 28.9 mL/min (A) (based on SCr of 1.35 mg/dL (H)).         Pertinent comorbidities:  ESRD was on PD and HD  DM II (A1c 5.8 on 12/07/22)  CAD  HLD  SBO 10/07/22 s/p ex lap 10/13/22 w/ lysis of adhesions     Pertinent exposures:  Originally from [place]   Treated with ivermectin at the ID clinic in 2018  Increased amount of gardening and outdoor exposures over past several months     Summary of pertinent prior infections:  #History of Shingles 2017  #04/2018 Dialysis fluid [place] R: Ampicillin, Unasyn, Cefazolin,S: Ceftriaxone, Gentamicin, Levo, PipTazo, Tobra  #Thrush w/presumed candidal esophagitis 10/10/22 s/p 14 day treatment fluconazole  #Donor urine cx with C. Dubliensis 08/05/22, <10K, negative recipient samples, negative donor blood cx, treated with 2 weeks PO fluconazole  # Recurrent MDR E.Coli Bacteremia/UTI  - 1/29 BCx/UCx E. Coli - s/p Levo x 3 weeks (thru stent removal, 3/15) (S: Cefazolin, Cipro/Levo, Pip-tazo, Cephalexin; R: TMP-SMX, Amp, Amp-sulb)  - 3/14 BCx/UCx ESBL/FQ-R E. Coli s/p 14 days Meropenem; also with perinephric fluid collection; (S: Erta, Gent, Mero, Tobra, Cefepime SDD; I: ceftaz; R: Cipro/Levo, Cefazolin, Ceftriaxone, Amp)  - 4/13 Bcx/Ucx E. Coli (R CFZ, CTX, cipro/levo, I ceftaz, SDD CFP, susc nitro). Treated vanc/mero -> mero x 2 weeks  - 4/29 started on nitrofurantoin prophylaxis  - 5/7 seen by urogyn, no structural issues planned to stop nitrofurantoin with switch to methenamine (not done), added vaginal estrogen/d-mannose.     #Perinephric fluid collection 10/05/22  - 3/19 IR guided drainage negative cx, Cr 1.5 not suggestive of urinoma  - 4/13 increased stranding, 4/16 not amenable to intervention per VIR     Active infection:  #Concern for recurrent MDR E. Coli UTI, despite nitrofurantoin ppx, transplant pyelonephritis 12/25/22  - 6/3 presented to [facility] with fever, n/v. No urinary symptoms, no cough or URI symptoms  - 6/3 UA (cath) trace leuks, few bacteria, no UCx; BCx (NGTD); RSV/Flu/Covid neg  - 6/3 OSH CT AP: mild transplant perinephric edema, no hydronephrosis, no fluid collections  - 6/3 [place] BCx (NGTD), CMV/EBV/BK PCR neg; Crypto Ag neg UCx <10,000 E. Coli (S: Cefepime SDD; Erta/Mero, Gent, Nitrofurantoin, Tobra)  - 6/3 US renal txp: No solid masses or calculi. No perinephric collections identified. 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  - 06/06/22 hepB surface Ab positive, surface Ag neg  - 08/11/22 HBV DNA not detected  - 09/06/22 HBV DNA not detected     Antimicrobial allergy/intolerance:   Penicillin (see allergy list for nuances)  Valacyclovir - nausea         RECOMMENDATIONS    Diagnosis  F/u 6/3 [place] BCx (NG 48  hrs as of 12/27/22 per microlab)  F/u 6/3 BCx (NGTD)  F/u ordered serum adenovirus, HBV DNA and HBsAg    Management  Continue IV meropenem, renal dosed equivalent of 1g q8h (renal dose is 500mg  q12h)    Antimicrobial prophylaxis required for transplant immunosuppression   Viral: Completed valcyte x 3 months  PJP: Continue TMP+SMX renally dosed equivalent 400/80 MWF x 6 months  UTI ppx: HOLD nitrofurintoin while on treatment above    Intensive toxicity monitoring for prescription antimicrobials   CBC w/diff at least once per week  CMP at least once per week  clinical assessments for rashes or other skin changes  Given hepB core+, needs LFTs q82m for first year, HBSAg and HBV DNA q22m for first year and with any rise in LFTs     The [facility] ID APP service will continue to follow.           Please page Darolyn Rua, NP at [phone] from 8-4:30pm, after 4:30 pm & on weekends, please page the ID Transplant/Liquid Oncology Fellow consult at [phone] with questions.    Patient discussed with Dr.  Para March .      Darolyn Rua, MSN, APRN, AGNP-C  Immunocompromised Infectious Disease Nurse Practitioner    I personally spent 35 minutes face-to-face and non-face-to-face in the care of this patient, which includes all pre, intra, and post visit time on the date of service.  All documented time was specific to the E/M visit and does not include any procedures that may have been performed.    Subjective:     External record(s): Primary team note: reviewed and noted plan to continue to monitor platelets as patient with elevated d-dimer & fibrinogen without any symptoms of clotting or bleeding .    Independent historian(s): no independent historian required.       Interval History:   Afebrile and NAEON. Patient denies shortness of breath, n/v/d, dysuria.     Medications:  Current Medications as of 12/27/2022  Scheduled  PRN   amlodipine, 5 mg, Q12H  [Provider Hold] aspirin, 81 mg, Daily  atorvastatin, 10 mg, Nightly  carvedilol, 25 mg, BID  heparin (porcine) for subcutaneous use, 5,000 Units, Q8H SCH  hydrALAZINE, 50 mg, BID  insulin lispro, 0-20 Units, ACHS  levothyroxine, 112 mcg, Daily  magnesium oxide-Mg AA chelate, 1 tablet, BID  melatonin, 6 mg, QPM  meropenem, 500 mg, Q12H SCH  pantoprazole, 40 mg, Daily  polyethylene glycol, 17 g, Daily  predniSONE, 5 mg, Daily  sodium bicarbonate, 650 mg, TID  spironolactone, 25 mg, Daily  sulfamethoxazole-trimethoprim, 1 tablet, Q MWF  tacrolimus, 1 mg, Daily   And  tacrolimus, 1 mg, Nightly      acetaminophen, 650 mg, Q6H PRN  dextrose in water, 12.5 g, Q10 Min PRN  glucagon, 1 mg, Once PRN  glucose, 16 g, Q10 Min PRN  ondansetron, 4 mg, Q8H PRN   Or  ondansetron, 4 mg, Q8H PRN  senna, 1 tablet, Daily PRN         Objective:     Vital Signs last 24 hours:  Temp:  [36.6 ??C (97.9 ??F)-37.4 ??C (99.3 ??F)] 37.4 ??C (99.3 ??F)  Heart Rate:  [67-79] 69  Resp:  [18-19] 18  BP: (154-161)/(43-45) 161/45  MAP (mmHg):  [76-79] 79  SpO2:  [90 %-100 %] 90 %    Physical Exam:   Patient Lines/Drains/Airways Status       Active Active Lines, Drains, & Airways  Name Placement date Placement time Site Days    Peripheral IV 12/25/22 Right Antecubital 12/25/22  0800  Antecubital  2    Peripheral IV 12/25/22 Posterior;Right Hand 12/25/22  0800  Hand  2    Arteriovenous Fistula - Vein Graft  Access 08/24/16 Left;Upper Arm 08/24/16  --  Arm  2316                  Const [x]  vital signs above    []  NAD, non-toxic appearance []  Chronically ill-appearing, non-distressed  Sitting in chair      Eyes [x]  Lids normal bilaterally, conjunctiva anicteric and noninjected OU     [] PERRL  [] EOMI        ENMT [x]  Normal appearance of external nose and ears, no nasal discharge        [x]  MMM, no lesions on lips or gums [x]  No thrush, leukoplakia, oral lesions  []  Dentition good []  Edentulous []  Dental caries present  []  Hearing normal  []  TMs with good light reflexes bilaterally         Neck [x]  Neck of normal appearance and trachea midline        []  No thyromegaly, nodules, or tenderness   []  Full neck ROM        Lymph []  No LAD in neck     []  No LAD in supraclavicular area     []  No LAD in axillae   []  No LAD in epitrochlear chains     []  No LAD in inguinal areas        CV []  RRR            []  No peripheral edema     []  Pedal pulses intact   []  No abnormal heart sounds appreciated   [x]  Extremities WWP   Murmur present      Resp [x]  Normal WOB at rest    []  No breathlessness with speaking, no coughing  [x]  CTA anteriorly    [x]  CTA posteriorly          GI [x]  Normal inspection, NTND   []  NABS     []  No umbilical hernia on exam       []  No hepatosplenomegaly     []  Inspection of perineal and perianal areas normal        GU []  Normal external genitalia     [] No urinary catheter present in urethra   []  No CVA tenderness    []  No tenderness over renal allograft        MSK []  No clubbing or cyanosis of hands       []  No vertebral point tenderness  []  No focal tenderness or abnormalities on palpation of joints in RUE, LUE, RLE, or LLE        Skin [x]  No rashes, lesions, or ulcers of visualized skin     []  Skin warm and dry to palpation         Neuro [x]  Face expression symmetric  []  Sensation to light touch grossly intact throughout    []  Moves extremities equally    []  No tremor noted        []  CNs II-XII grossly intact     []  DTRs normal and symmetric throughout []  Gait unremarkable        Psych [x]  Appropriate affect       []  Fluent speech         [x]  Attentive, good eye contact  [x]  Oriented to  person, place, time          []  Judgment and insight are appropriate           Data for Medical Decision Making     12/25/22 EKG QTcF    I discussed mgm't w/qualified health care professional(s) involved in case: discussed with primary team recommendations for further diagnostic testing .    I reviewed CBC results (WBC WNL; Hgb stable; Platelets low but improved), chemistry results (creatinine increased today), and micro result(s) (E. Coli uti is still susceptible to nitrofurantoin ).    I independently visualized/interpreted not done.       Recent Labs     Units 12/27/22  0812   WBC 10*9/L 4.3   HGB g/dL 8.1*   PLT 09*8/J 71*   NEUTROABS 10*9/L 3.4   LYMPHSABS 10*9/L 0.5*   EOSABS 10*9/L 0.1   BUN mg/dL 47*   CREATININE mg/dL 1.91*   K mmol/L 4.5   MG mg/dL 1.8   PHOS mg/dL 3.1   CALCIUM mg/dL 8.8     Lab Results   Component Value Date    CMVLR Not Detected 12/25/2022         Microbiology:  Microbiology Results (last day)       Procedure Component Value Date/Time Date/Time    Urine Culture [[id number]]  (Abnormal)  (Susceptibility) Collected: 12/25/22 1747    Lab Status: Edited Result - FINAL Specimen: Urine from Clean Catch Updated: 12/27/22 1132     Urine Culture, Comprehensive <10,000 CFU/mL Escherichia coli     Comment: Processed at Physicians Request       Narrative:      Specimen Source: Clean Catch    Susceptibility       Escherichia coli (1)       Antibiotic Interpretation Microscan Method Status    Ampicillin Resistant  MIC SUSCEPTIBILITY RESULT Final    Aztreonam Resistant  MIC SUSCEPTIBILITY RESULT Final    Cefazolin Resistant  MIC SUSCEPTIBILITY RESULT Final    Cephalexin Resistant  MIC SUSCEPTIBILITY RESULT Final     For uncomplicated UTI's only.       Cefepime Susceptible-Dose Dependent  MIC SUSCEPTIBILITY RESULT Final     Based on 2g every 8h with extended infusion in adult patients.       Ceftazidime Intermediate  MIC SUSCEPTIBILITY RESULT Final    Ceftriaxone Resistant  MIC SUSCEPTIBILITY RESULT Final    Ciprofloxacin Resistant  MIC SUSCEPTIBILITY RESULT Final    Ertapenem Susceptible  MIC SUSCEPTIBILITY RESULT Final    Gentamicin Susceptible  MIC SUSCEPTIBILITY RESULT Final    Levofloxacin Resistant  MIC SUSCEPTIBILITY RESULT Final    Meropenem Susceptible  MIC SUSCEPTIBILITY RESULT Final    Nitrofurantoin Susceptible  MIC SUSCEPTIBILITY RESULT Final    Tetracycline Resistant  MIC SUSCEPTIBILITY RESULT Final     Organisms that test susceptible to tetracycline are considered susceptible to doxycycline.However, some organisms that test intermediate or resistant to tetracycline may be susceptible to doxycycline.       Tobramycin Susceptible  MIC SUSCEPTIBILITY RESULT Final    Trimethoprim + Sulfamethoxazole Resistant  MIC SUSCEPTIBILITY RESULT Final                       Micro Add-on [[id number]] Collected: 12/25/22 1747    Lab Status: Final result Specimen: Urine from Clean Catch Updated: 12/27/22 1132    Blood Culture, Adult [[id number]] (Normal) Collected: 12/25/22 2110    Lab Status: Preliminary result Specimen: Blood  from 1 Peripheral Draw Updated: 12/26/22 2145     Blood Culture, Routine No Growth at 24 hours    Blood Culture, Adult [[id number]]  (Normal) Collected: 12/25/22 2110    Lab Status: Preliminary result Specimen: Blood from 1 Peripheral Draw Updated: 12/26/22 2145     Blood Culture, Routine No Growth at 24 hours           Imaging:  No new imaging

## 2022-12-27 NOTE — Unmapped (Signed)
Pt has no fever during the shift. IV Abx. No report of pain, discomfort, or UTI symptom. Blood culture result pending. Standby assist and walk to bathroom. Didn't record the UOP because pt started using bathroom instead of bedside commode today and there is no hat. Put hat in the toilet now.

## 2022-12-27 NOTE — Unmapped (Signed)
Nephrology ([facility]) Progress Note    Assessment & Plan:   Julie [name] is a 72 y.o. female whose presentation is complicated by s/p DDKT 08/06/22, HTN, HLD, T2DM, recent hx candida esophagitis, AoCD, thrombocytopenia that presented to [facility] from OSH with fever.     Principal Problem:    Fever  Active Problems:    Type 2 diabetes mellitus with diabetic nephropathy, with long-term current use of insulin (CMS-HCC)    Hypothyroidism, acquired    UTI (urinary tract infection)    Delirium    Immunosuppressed status (CMS-HCC)    Hypertension    Hyperlipidemia    Anemia of chronic disease    Chronic idiopathic thrombocytopenia (CMS-HCC)    Thrombocytopenia (CMS-HCC)  Resolved Problems:    * No resolved hospital problems. *      Active Problems    Acute Fever in Immunocompromised Host - Hx of recurrent MDR UTIs and Bacteremia   Presenting with confusion, fever, and vomiting x1. Labs remarkable for UA with leukocytes, no leukocytosis (c/w prior presentations), normal lactate, negative RPP.   Had several recent admissions s/p DDKT for MDR UTI and ESBL E.coli bacteremia treated with IV levofloxacin in January and IV meropenem in March 2024 and April 2024. Was on UTI prophylaxis with nitrofurantoin, estradiol cream, cranberry, and d-mannose. Received empiric vancomycin and cefepime at OSH and transitioned to meropenem and continued vancomycin at [facility]. Patient has remained afebrile on meropenem since 6/3 evening. Repeat Ucx at [facility] showing E.coli <10,000 as likely source of infection. Suspect low Ucx bacteria counts due to prophylaxis as above. BCX NGTD at 24 hours, awaiting results for 48 hours. If no clear source identified, concern for graft pyelonephritis. EBV, CMV, HLA, BK, cryptococcal antigen not detected.  - ICID consulted, appreciate recs  - Continue Meropenem (6/3-)   - Reduced dose on 6/4 to 500g BID from 1g BID  - Discontinue Vancomycin (6/3-6/4)  - F/up Bcx x2 at OSH and repeat at [facility] on 6/3   - Follow up urine culture at OSH   - Daily CBC w/ diff      S/p L DDKT 08/06/22 2/2 diabetic nephropathy I NAGMA (resolved)  Follows with Dr. Elvera Maria. Lowest Cr ~0.95 after DDKT, presumed new baseline between 0.9-1.2. Cr 1.26 at baseline at OSH and stable at [facility] ~Cr 1.2 with anticipated lab margin of error. Monitoring tacrolimus MWF, Tac trough 6/5 7.7 at goal. UOP limited today as collection only began midway through nightshift.  - Continue Bicarb 650mg  TID  - Tacrolimus 1mg  BID  - Prednisone 5mg  daily  - HOLD Cellcept 500 mg BID in setting of fever and UTI  - Bactrim 400-80mg  MWF for PJP ppx  - Protonix 40mg  for PPI ppx  - Held ASA 81 mg for any intervention  - Monitor tac trough MWF  - Daily RFP, Mg, Phos  - Strict I/Os    Acute on Chronic Thrombocytopenia  Known chronic thrombocytopenia ~110-130, acutely dropped to Plt 64 on 6/4. Smear and DIC unremarkable. Elevated d-dimer 583 and fibrinogen 585, with limited clinical correlation given patient without any symptoms of bleeding or clotting. Of note, patient has had similar drop in platelets during previous admissions for bacteremia. Given spontaneous resolution with past bacteremia episodes, will continue to trend platelets and monitor for new symptoms  - CBC daily to trend plt     Acute Metabolic Encephalopathy  Previous history of Hypoactive and Hyperactive delirium with bacteremia. Per daughter, patient is very affected by fevers. On admission,  was oriented to time and place, but unable to complete orientation exams (days of the week, etc.). OSH CT head w/o contrast with no acute findings. On 6/4, was much more alert and could complete orientation exam (date & days of the week) without difficulty.  - Continue delirium precautions     Chronic Problems     HTN:   - Continued home amlodipine 5mg  BID  - Continue home carvedilol 25mg  BID   - HOLD home chlorthalidone 25mg  BID  - Continue home hydralazine 50mg  BID  - Continue home spironolactone 25mg  daily     HLD: Continued home statin 10mg  daily     T2DM, insulin dependent: 12/07/22 HbA1C 5.8. On 6 units nightly Lantus and meal time insulin at home. Has not needed any insulin 6/3-6/4.  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 Hgb 8-9 this admission. Hgb ~10 at previous admission. Iron panel and ferritin labs pending.  - F/up Iron panel and ferritin      Hypothyroidism: levothyroxine 112 mcg daily  Sleeping: melatonin 6mg  nightly      Issues Impacting Complexity of Management:  -Intensive monitoring of drug toxicity from Vancomycin with scheduled BMP and/or Vancomycin levels and Tacrolimus with scheduled levels  -The patient is at high risk from Hospital immobility in an elderly patient given baseline poor functional status with a high risk of causing delirium and further decline in function      Medical Decision Making: Reviewed records from the following unique sources  [facility].      Daily Checklist:  Diet: Regular Diet  DVT PPx: Heparin 5000units q8h  Electrolytes: Replete Potassium to >/=4 and Magnesium to >/=2  Code Status: Full Code  Dispo: Home    Team Contact Information:   Primary Team: Nephrology ([facility])  Primary Resident: [name], MD  Resident's Pager: [phone] (Nephrology Intern - Alvester Morin)    Interval History:   No acute events overnight. Patient is much more alert today and inquiring to go home. Feels good and is sitting in chair. Denies dysuria. Has not had BM in two days but has also not been eating as much in hospital. Denies constipation symptoms. No acute complaints or concerns.    ROS: Denies headache, chest pain, shortness of breath, abdominal pain, nausea, vomiting.    Objective:   Temp:  [36.6 ??C (97.9 ??F)-37.4 ??C (99.3 ??F)] 37.4 ??C (99.3 ??F)  Heart Rate:  [67-79] 69  Resp:  [18-19] 18  BP: (154-161)/(43-45) 161/45  SpO2:  [90 %-100 %] 90 %    Gen: NAD, converses, sitting in chair  HENT: atraumatic, normocephalic  Heart: RRR  Lungs: CTAB, no crackles or wheezes  Abdomen: soft, NTND  Extremities: No edema    [name], MS4    I attest that I have reviewed the student note and that the components of the history of the present illness, the physical exam, and the assessment and plan documented, were performed by me or were performed in my presence, by the student, where I verified the documentation and performed (or re-performed) the exam and medical decision making.      Franchot Gallo, MD

## 2022-12-27 NOTE — Unmapped (Addendum)
Alert and oriented, VSS, afebrile, on RA. Denies pain. Telemetry and continuous pulse ox on. BG monitored, no coverage given. IV abx infused as ordered. Free from falls and injury. Bed low and locked, call bell and side table within reach.    Problem: Adult Inpatient Plan of Care  Goal: Plan of Care Review  Outcome: Progressing  Goal: Patient-Specific Goal (Individualized)  Outcome: Progressing  Goal: Absence of Hospital-Acquired Illness or Injury  Outcome: Progressing  Intervention: Identify and Manage Fall Risk  Recent Flowsheet Documentation  Taken 12/26/2022 2106 by Chauncy Passy, RN  Safety Interventions:   lighting adjusted for tasks/safety   low bed   family at bedside  Intervention: Prevent Skin Injury  Recent Flowsheet Documentation  Taken 12/26/2022 2106 by Chauncy Passy, RN  Positioning for Skin: Other (Comment)  Device Skin Pressure Protection: absorbent pad utilized/changed  Skin Protection: adhesive use limited  Intervention: Prevent Infection  Recent Flowsheet Documentation  Taken 12/26/2022 2106 by Chauncy Passy, RN  Infection Prevention: hand hygiene promoted  Goal: Optimal Comfort and Wellbeing  Outcome: Progressing  Goal: Readiness for Transition of Care  Outcome: Progressing  Goal: Rounds/Family Conference  Outcome: Progressing     Problem: Self-Care Deficit  Goal: Improved Ability to Complete Activities of Daily Living  Outcome: Progressing     Problem: Fall Injury Risk  Goal: Absence of Fall and Fall-Related Injury  Outcome: Progressing  Intervention: Promote Injury-Free Environment  Recent Flowsheet Documentation  Taken 12/26/2022 2106 by Chauncy Passy, RN  Safety Interventions:   lighting adjusted for tasks/safety   low bed   family at bedside     Problem: Wound  Goal: Optimal Coping  Outcome: Progressing  Goal: Optimal Functional Ability  Outcome: Progressing  Intervention: Optimize Functional Ability  Recent Flowsheet Documentation  Taken 12/26/2022 2106 by Chauncy Passy, RN  Activity Management: ambulated in room  Goal: Absence of Infection Signs and Symptoms  Outcome: Progressing  Intervention: Prevent or Manage Infection  Recent Flowsheet Documentation  Taken 12/26/2022 2106 by Chauncy Passy, RN  Infection Management: aseptic technique maintained  Goal: Improved Oral Intake  Outcome: Progressing  Goal: Optimal Pain Control and Function  Outcome: Progressing  Goal: Skin Health and Integrity  Outcome: Progressing  Intervention: Optimize Skin Protection  Recent Flowsheet Documentation  Taken 12/26/2022 2106 by Chauncy Passy, RN  Activity Management: ambulated in room  Pressure Reduction Techniques: frequent weight shift encouraged  Head of Bed (HOB) Positioning: HOB at 30-45 degrees  Pressure Reduction Devices: positioning supports utilized  Skin Protection: adhesive use limited  Goal: Optimal Wound Healing  Outcome: Progressing

## 2022-12-27 NOTE — Unmapped (Signed)
OCCUPATIONAL THERAPY  Evaluation (12/27/22 1141)    Patient Name:  Julie [name]       Medical Record Number: [id number]   Date of Birth: 1950-08-06  Sex: Female            OT Treatment Diagnosis:  Impaired ADL and mobility 2/2 fever    Assessment  Problem List: Decreased safety awareness, Fall risk    Personal Factors/Comorbidities (Occupational Profile and History Review): Expanded (Moderate)  Assessment of Occupational Performance : Cognitive skills  Clinical Decision Making: Low Complexity    Assessment: Patient is a 72 y.o. 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 Pt presents to OT evaluation agreeable and willing to particiapte. This date, pt is performing ADL skills at her functional basline but presents with decreased safety awareness and occasional impulsivity when completing t/fs and functional mobility. Upon d/c, 3x is recommended for maximizing pt safety when participating in ADL and functional mobility.    Today's Interventions: Pt participated in bed mobility, functional mobility, and ROM at UE. Pt educated on role of OT.    Activity Tolerance During Today's Session  Tolerated treatment well    Plan  Planned Frequency of Treatment:  1-2x per day for: 2-3x week  Planned Treatment Duration: 01/17/23    Planned Interventions:  Education (Patient/Family/Caregiver), Self-Care/Home Training, Therapeutic Activity, Therapeutic Exercise, Cognitive Skills Development      Post-Discharge Occupational Therapy Recommendations:   Supervision needed for safety with basic self-care routines, Supervision needed for safety with functional transfers, 3x weekly   OT DME Recommendations: None -        GOALS:   Patient and Family Goals: To go home.    Short Term:  SHORT GOAL #1: Pt will complete bathing ADL with mod I   Time Frame : 2 weeks  SHORT GOAL #2: Pt will complete toilet t/f and toileting ADL with mod I   Time Frame : 2 weeks  SHORT GOAL #3: Pt will complete FBD with mod I and compensatory strategies   Time Frame : 2 weeks          Prognosis:  Good  Positive Indicators:  PLOF, caregiver support  Barriers to Discharge: Decreased safety awareness    Subjective  Medical Updates Since Last Visit:      Pain  Pt denies pain    Prior Functional Status Pt was independent in functional mobility and ADL skills. Pt reports that she has a rollator but does not use it. Pt states that she enjoys being in her garden and grows both flowers and produce.    Living Situation  Living Environment: House  Lives With: Spouse  Home Living: One level home, Stairs to enter with rails, Tub/shower unit, Standard height toilet, Shower chair with back, Walk-in shower  Rail placement (outside): Rail on right side  Number of Stairs to Enter (outside): 3  Caregiver Identified?: Yes  Caregiver Availability: 24 hours  Caregiver Ability: Limited lifting      Equipment available at home: Paediatric nurse with back, Rollator      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)     since July 2017    Hypertension     Hypothyroidism (acquired)  Kidney transplant status, cadaveric 08/08/2022    Type 2 diabetes mellitus (CMS-HCC)     Social History     Tobacco Use    Smoking status: Former     Current packs/day: 0.00     Average packs/day: 0.8 packs/day for 30.0 years (22.5 ttl pk-yrs)     Types: Cigarettes     Start date: 06/06/1976     Quit date: 06/06/2006     Years since quitting: 16.5    Smokeless tobacco: Former   Substance Use Topics    Alcohol use: Yes     Comment: only special occasions      Past Surgical History:   Procedure Laterality Date    CATARACT EXTRACTION      HYSTERECTOMY      OOPHORECTOMY      PERITONEAL CATHETER INSERTION      PR LAP INSERTION TUNNELED INTRAPERITONEAL CATHETER N/A 09/27/2018    Procedure: LAPAROSCOPY, SURGICAL; WITH INSERTION OF INTRAPERITONEAL CANNULA OR CATHETER, PERMANENT;  Surgeon: Leona Carry, MD;  Location: MAIN OR [place];  Service: Transplant    PR LAP INSERTION TUNNELED INTRAPERITONEAL CATHETER N/A 04/08/2019    Procedure: LAPAROSCOPY, SURGICAL; WITH INSERTION OF INTRAPERITONEAL CANNULA OR CATHETER, PERMANENT;  Surgeon: Leona Carry, MD;  Location: MAIN OR [place];  Service: Transplant    PR LAP REVISE INTRAPERITONEAL CATHETER N/A 08/26/2019    Procedure: LAPAROSCOPY, SURGICAL; W/REVIS PREV PLACED INTRAPERITONEAL CANNULA/CATH, REMOV INTRALUMIN OBSTRUCT MATERIAL;  Surgeon: Leona Carry, MD;  Location: MAIN OR [facility];  Service: Transplant    PR REDUCE VOLVULUS,INTUSS,INTERN HERNIA N/A 10/13/2022    Procedure: REDUCTION OF VOLVULUS, INTUSSUSCEPTION, INTERNAL HERNIA, BY LAPAROTOMY;  Surgeon: Suella Broad, MD;  Location: MAIN OR [place];  Service: Trauma    PR REMOVAL TUNNELED INTRAPERITONEAL CATHETER N/A 05/06/2018    Procedure: REMOVAL OF PERMANENT INTRAPERITONEAL CANNULA OR CATHETER;  Surgeon: Leona Carry, MD;  Location: MAIN OR [facility];  Service: Transplant    PR REMOVE PERITONEAL FOREIGN BODY N/A 04/08/2019    Procedure: Removal Of Peritoneal Of Foreign Body From Peritoneal Cavity;  Surgeon: Leona Carry, MD;  Location: MAIN OR [facility];  Service: Transplant    PR TRANSPLANT,PREP RENAL GRAFT/ARTERIAL Right 08/06/2022    Procedure: [facility] RECONSTRUCTION CADAVER/LIVING DONOR RENAL ALLOGRAFT PRIOR TO TRANSPLANT; ARTERIAL ANASTOMOSIS EAC;  Surgeon: Toledo, Lilyan Punt, MD;  Location: MAIN OR [facility];  Service: Transplant    PR TRANSPLANTATION OF KIDNEY Right 08/06/2022    Procedure: RENAL ALLOTRANSPLANTATION, IMPLANTATION OF GRAFT; WITHOUT RECIPIENT NEPHRECTOMY;  Surgeon: Toledo, Lilyan Punt, MD;  Location: MAIN OR [facility];  Service: Transplant    TUBAL LIGATION      Family History   Problem Relation Age of Onset    Diabetes Mother     Alzheimer's disease Father     Diabetes Sister     Diabetes Sister     Diabetes Sister     Anesthesia problems Neg Hx     Bleeding Disorder Neg Hx         Oxycodone, Gabapentin, and Valacyclovir     Objective Findings  Precautions / Restrictions  Falls precautions       Communication Preference  Verbal    Equipment / Environment  Vascular access (PIV, TLC, Port-a-cath, PICC), Telemetry     Functional Mobility  Transfers: Contact Guard assist  Bed Mobility - Needs Assistance: Supervision  Ambulation: Pt completed sit-stand with SBA and no AD, transferred from chair to bed with CGA-SBA with gaitbelt.    ADLs  ADLs:  Supervision  Feeding - Needs Assistance: Supervision  Grooming - Needs Assistance: Supervision  Bathing - Needs Assistance: Supervision  Toileting - Needs Assistance: Supervision  UB Dressing - Needs Assistance: Supervision  LB Dressing - Needs Assistance: Supervision  IADLs: NT    Vitals / Orthostatics  Vitals/Orthostatics: VSS throughout session.    Cognition   Orientation Level:  Oriented x 4   Arousal/Alertness:  Appropriate responses to stimuli   Attention Span:  Appears intact   Memory:  Appears intact   Following Commands:  Follows all commands and directions without difficulty   Safety Judgment:  Decreased awareness of need for safety   Problem Solving:  Assistance required to identify errors made (Pt does not like to use AD when participating in functional mobility tasks. When pt complete sit-sup, she landed at the very edge of the bed but reported I like this so I can get my feet off the bed easier.)      Vision / Hearing   Vision: Wears glasses for reading only     Hearing: Hearing aids present   Hearing: Pt was only wearing the R hearing aide while the left one was across the room.     Hand Function:  Right Hand Function: Right hand grip strength, ROM and coordination WNL  Left Hand Function: Left hand grip strength, ROM and coordination WNL  Hand Dominance: Right    Skin Inspection:  Skin Inspection: Intact where visualized    ROM / Strength:  UE ROM/Strength: Left WFL, Right WFL  LE ROM/Strength: Left WFL, Right WFL    Coordination:  Coordination: WFL    Sensation:  RUE Sensation: RUE intact  LUE Sensation: LUE intact  RLE Sensation: RLE intact  LLE Sensation: LLE intact  Sensory/ Proprioception/ Stereognosis comments: Pt denies any numbbess or tingling.    Balance:  Static Sitting-Level of Assistance: Supervision  Dynamic Sitting-Level of Assistance: Supervision    Static Standing-Level of Assistance: Supervision  Dynamic Standing - Level of Assistance: Supervision      AM-PAC-Daily Activity  Lower Body Dressing assistance needs: A Little - Minimal/Contact Guard Assist/Supervision  Bathing assistance needs: A Little - Minimal/Contact Guard Assist/Supervision  Toileting assistance needs: A Little - Minimal/Contact Guard Assist/Supervision  Upper Body Dressing assistance needs: A Little - Minimal/Contact Guard Assist/Supervision  Personal Grooming assistance needs: A Little - Minimal/Contact Guard Assist/Supervision  Eating Meals assistance needs: A Little - Minimal/Contact Guard Assist/Supervision    Daily Activity Score:  Daily Activity Score: 18    Score (in points): % of Functional Impairment, Limitation, Restriction  6: 100% impaired, limited, restricted  7-8: At least 80%, but less than 100% impaired, limited restricted  9-13: At least 60%, but less than 80% impaired, limited restricted  14-19: At least 40%, but less than 60% impaired, limited restricted  20-22: At least 20%, but less than 40% impaired, limited restricted  23: At least 1%, but less than 20% impaired, limited restricted  24: 0% impaired, limited restricted    Patient at end of session: All needs in reach, In bed, Friends/Family present, Lines intact, Nurse notified          Occupational Therapy Session Duration  OT Individual [mins]: 17         I attest that I have reviewed the above information.  Signed: Celesta Aver, OT  Filed 12/27/2022    The care for this patient was completed by Celesta Aver, OT:  A student was present and participated in the care.  Licensed/Credentialed therapist was physically present and immediately available to direct and supervise tasks that were related to patient management. The direction and supervision was continuous throughout the time these tasks were performed.    Celesta Aver, OT

## 2022-12-28 LAB — CBC W/ AUTO DIFF
BASOPHILS ABSOLUTE COUNT: 0 10*9/L (ref 0.0–0.1)
BASOPHILS RELATIVE PERCENT: 0.3 %
EOSINOPHILS ABSOLUTE COUNT: 0.1 10*9/L (ref 0.0–0.5)
EOSINOPHILS RELATIVE PERCENT: 1.7 %
HEMATOCRIT: 23.2 % — ABNORMAL LOW (ref 34.0–44.0)
HEMOGLOBIN: 7.7 g/dL — ABNORMAL LOW (ref 11.3–14.9)
LYMPHOCYTES ABSOLUTE COUNT: 0.5 10*9/L — ABNORMAL LOW (ref 1.1–3.6)
LYMPHOCYTES RELATIVE PERCENT: 16 %
MEAN CORPUSCULAR HEMOGLOBIN CONC: 33.2 g/dL (ref 32.0–36.0)
MEAN CORPUSCULAR HEMOGLOBIN: 31.7 pg (ref 25.9–32.4)
MEAN CORPUSCULAR VOLUME: 95.6 fL (ref 77.6–95.7)
MEAN PLATELET VOLUME: 9 fL (ref 6.8–10.7)
MONOCYTES ABSOLUTE COUNT: 0.3 10*9/L (ref 0.3–0.8)
MONOCYTES RELATIVE PERCENT: 9.4 %
NEUTROPHILS ABSOLUTE COUNT: 2.3 10*9/L (ref 1.8–7.8)
NEUTROPHILS RELATIVE PERCENT: 72.6 %
PLATELET COUNT: 78 10*9/L — ABNORMAL LOW (ref 150–450)
RED BLOOD CELL COUNT: 2.43 10*12/L — ABNORMAL LOW (ref 3.95–5.13)
RED CELL DISTRIBUTION WIDTH: 13.9 % (ref 12.2–15.2)
WBC ADJUSTED: 3.2 10*9/L — ABNORMAL LOW (ref 3.6–11.2)

## 2022-12-28 LAB — RENAL FUNCTION PANEL
ALBUMIN: 2.9 g/dL — ABNORMAL LOW (ref 3.4–5.0)
ANION GAP: 6 mmol/L (ref 5–14)
BLOOD UREA NITROGEN: 39 mg/dL — ABNORMAL HIGH (ref 9–23)
BUN / CREAT RATIO: 30
CALCIUM: 8.6 mg/dL — ABNORMAL LOW (ref 8.7–10.4)
CHLORIDE: 108 mmol/L — ABNORMAL HIGH (ref 98–107)
CO2: 25 mmol/L (ref 20.0–31.0)
CREATININE: 1.31 mg/dL — ABNORMAL HIGH
EGFR CKD-EPI (2021) FEMALE: 43 mL/min/{1.73_m2} — ABNORMAL LOW (ref >=60)
GLUCOSE RANDOM: 197 mg/dL — ABNORMAL HIGH (ref 70–179)
PHOSPHORUS: 2.4 mg/dL (ref 2.4–5.1)
POTASSIUM: 4.9 mmol/L — ABNORMAL HIGH (ref 3.4–4.8)
SODIUM: 139 mmol/L (ref 135–145)

## 2022-12-28 LAB — MAGNESIUM: MAGNESIUM: 1.8 mg/dL (ref 1.6–2.6)

## 2022-12-28 LAB — PERIPHERAL BLOOD SMEAR, PATH REVIEW

## 2022-12-28 MED ADMIN — magnesium oxide-Mg AA chelate (Magnesium Plus Protein) 1 tablet: 1 | ORAL | @ 13:00:00

## 2022-12-28 MED ADMIN — pantoprazole (Protonix) EC tablet 40 mg: 40 mg | ORAL | @ 13:00:00

## 2022-12-28 MED ADMIN — sodium bicarbonate tablet 650 mg: 650 mg | ORAL | @ 19:00:00

## 2022-12-28 MED ADMIN — atorvastatin (LIPITOR) tablet 10 mg: 10 mg | ORAL

## 2022-12-28 MED ADMIN — amlodipine (NORVASC) tablet 5 mg: 5 mg | ORAL | @ 13:00:00

## 2022-12-28 MED ADMIN — meropenem (MERREM) 500 mg in sodium chloride 0.9 % (NS) 100 mL IVPB-MBP: 500 mg | INTRAVENOUS | @ 01:00:00 | Stop: 2023-01-01

## 2022-12-28 MED ADMIN — spironolactone (ALDACTONE) tablet 25 mg: 25 mg | ORAL | @ 13:00:00

## 2022-12-28 MED ADMIN — levothyroxine (SYNTHROID) tablet 112 mcg: 112 ug | ORAL | @ 11:00:00

## 2022-12-28 MED ADMIN — tacrolimus (PROGRAF) capsule 1 mg: 1 mg | ORAL

## 2022-12-28 MED ADMIN — sodium bicarbonate tablet 650 mg: 650 mg | ORAL | @ 13:00:00

## 2022-12-28 MED ADMIN — predniSONE (DELTASONE) tablet 5 mg: 5 mg | ORAL | @ 13:00:00

## 2022-12-28 MED ADMIN — hydrALAZINE (APRESOLINE) tablet 50 mg: 50 mg | ORAL | @ 13:00:00

## 2022-12-28 MED ADMIN — carvedilol (COREG) tablet 25 mg: 25 mg | ORAL

## 2022-12-28 MED ADMIN — sodium bicarbonate tablet 650 mg: 650 mg | ORAL

## 2022-12-28 MED ADMIN — tacrolimus (PROGRAF) capsule 1 mg: 1 mg | ORAL | @ 13:00:00

## 2022-12-28 MED ADMIN — insulin lispro (HumaLOG) injection 0-20 Units: 0-20 [IU] | SUBCUTANEOUS | @ 04:00:00

## 2022-12-28 MED ADMIN — hydrALAZINE (APRESOLINE) tablet 50 mg: 50 mg | ORAL

## 2022-12-28 MED ADMIN — heparin (porcine) 5,000 unit/mL injection 5,000 Units: 5000 [IU] | SUBCUTANEOUS | @ 11:00:00 | Stop: 2022-12-28

## 2022-12-28 MED ADMIN — insulin lispro (HumaLOG) injection 0-20 Units: 0-20 [IU] | SUBCUTANEOUS | @ 20:00:00

## 2022-12-28 MED ADMIN — heparin (porcine) 5,000 unit/mL injection 5,000 Units: 5000 [IU] | SUBCUTANEOUS | @ 19:00:00 | Stop: 2022-12-28

## 2022-12-28 MED ADMIN — carvedilol (COREG) tablet 25 mg: 25 mg | ORAL | @ 13:00:00

## 2022-12-28 MED ADMIN — amlodipine (NORVASC) tablet 5 mg: 5 mg | ORAL

## 2022-12-28 MED ADMIN — magnesium oxide-Mg AA chelate (Magnesium Plus Protein) 1 tablet: 1 | ORAL

## 2022-12-28 MED ADMIN — heparin (porcine) 5,000 unit/mL injection 5,000 Units: 5000 [IU] | SUBCUTANEOUS | @ 03:00:00

## 2022-12-28 MED ADMIN — meropenem (MERREM) 500 mg in sodium chloride 0.9 % (NS) 100 mL IVPB-MBP: 500 mg | INTRAVENOUS | @ 13:00:00 | Stop: 2022-12-28

## 2022-12-28 NOTE — Unmapped (Signed)
Nephrology (MEDB) Progress Note    Assessment & Plan:   Julie [name] is a 72 y.o. 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     Principal Problem:    Fever  Active Problems:    Type 2 diabetes mellitus with diabetic nephropathy, with long-term current use of insulin (CMS-HCC)    Hypothyroidism, acquired    UTI (urinary tract infection)    Delirium    Immunosuppressed status (CMS-HCC)    Hypertension    Hyperlipidemia    Anemia of chronic disease    Chronic idiopathic thrombocytopenia (CMS-HCC)    Thrombocytopenia (CMS-HCC)  Resolved Problems:    * No resolved hospital problems. *      Active Problems    Acute Fever in Immunocompromised Host - Hx of recurrent MDR UTIs and Bacteremia   Patient has remained afebrile on meropenem since 6/3 evening. Repeat Ucx at [facility] showing E.coli <10,000 though not significant enough to reliably label as source of infection. Suspect low Ucx bacteria counts due to prophylaxis as above. BCX NGTD at 48 hours at both OSH and [facility]. Will pursue treatment for presumed graft pyelonephritis in absence of clear infectious source with antibiotic course of 21 days. Given known MDR E.coli, will need IV antibiotics. VIR consulted, awaiting evaluation.    - ICID consulted, appreciate recs  - Continue Meropenem (6/3-)  - VIR consulted for powerline placement, likely tomorrow 6/7  - Daily CBC w/ diff      S/p L DDKT 08/06/22 2/2 diabetic nephropathy I NAGMA (resolved)  Follows with Dr. Elvera Maria. Lowest Cr ~0.95 after DDKT, presumed new baseline between 0.9-1.2. Cr 1.26 at baseline at OSH and stable at [facility] ~Cr 1.2-1.3 with anticipated lab margin of error. UOP limited today as hat not consistently being utilized.  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   Smear indicative of pseudothrombocytopenia, unrevealing for pathological process.  - CBC daily to trend plt     Hx of Acute Metabolic Encephalopathy with fever  Presented with hypoactive and hyperactive delirium on admission. Now oriented and alert x4. Has not been febrile since 6/3 evening.  - Continue delirium precautions     Chronic Problems     HTN:   - Continued home amlodipine 5mg  BID  - Continue home carvedilol 25mg  BID   - Continue home hydralazine 50mg  BID  - Continue home spironolactone 25mg  daily   - HOLD home chlorthalidone 25mg  BID    HLD: Continued home statin 10mg  daily     T2DM, insulin dependent: 12/07/22 HbA1C 5.8. On 6 units nightly Lantus and meal time insulin at home. Has not needed any insulin 6/3-6/4.  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 Hgb 8-9 this admission. Hgb ~10 at previous  admission. Iron panel and ferritin labs pending.  - F/up Iron panel and ferritin      Hypothyroidism: levothyroxine 112 mcg daily  Sleeping: melatonin 6mg  nightly      Issues Impacting Complexity of Management:  -Intensive monitoring of drug toxicity from Vancomycin with scheduled BMP and/or Vancomycin levels and Tacrolimus with scheduled levels  -The patient is at high risk from Hospital immobility in an elderly patient given baseline poor functional status with a high risk of causing delirium and further decline in function      Medical Decision Making: Reviewed records from the following unique sources  [facility].      Daily Checklist:  Diet: Regular Diet  DVT PPx: Heparin 5000units q8h  Electrolytes: Replete Potassium to >/=4 and Magnesium to >/=2  Code Status: Full Code  Dispo: Home    Team Contact Information:   Primary Team: Nephrology (MEDB)  Primary Resident: Franchot Gallo, MD  Resident's Pager: [phone] (Nephrology Intern - Alvester Morin)    Interval History:   No acute events overnight. Patient is much more alert today and inquiring to go home. Understands though is frustrated by the long antibiotic course requiring a line. No acute complaints or concerns.    ROS: Denies headache, chest pain, shortness of breath, abdominal pain, nausea, vomiting.    Objective:   Temp:  [36.6 ??C (97.9 ??F)] 36.6 ??C (97.9 ??F)  Heart Rate:  [66-70] 66  Resp:  [20] 20  BP: (154-173)/(38-52) 173/52  SpO2:  [96 %-98 %] 98 %    Gen: NAD, converses, sitting in chair  HENT: atraumatic, normocephalic  Heart: RRR  Lungs: CTAB, no crackles or wheezes  Abdomen: soft, NTND, no CVA tenderness  Extremities: No edema    [name], MS4    I attest that I have reviewed the student note and that the components of the history of the present illness, the physical exam, and the assessment and plan documented, were performed by me or were performed in my presence, by the student, where I verified the documentation and performed (or re-performed) the exam and medical decision making.      Franchot Gallo, MD

## 2022-12-28 NOTE — Unmapped (Signed)
Solid Organ Transplant Infectious Diseases Follow-up Instructions  Appointment: 01/18/23 at 11:00am  Location: [facility], [address], [place], [place]  Labs: weekly CBC with differential, CMP  Please fax labs to patient???s transplant coordinator: Celene Squibb at  [phone] (Kidney/Pancreas) and to Michela Pitcher (ICHID pharmD at [phone])   Antibiotics:   Ertapenem IV 1g daily     Antibiotic End Date: 01/15/23

## 2022-12-28 NOTE — Unmapped (Addendum)
Alert and oriented, VSS, afebrile on RA, denies pain. Telemetry and pulse ox on. IV abx infused as ordered. BG monitored, and coverage given. Remains free from falls and injury. Bed low and locked, call bell and side table within reach.    Problem: Adult Inpatient Plan of Care  Goal: Plan of Care Review  Outcome: Progressing  Goal: Patient-Specific Goal (Individualized)  Outcome: Progressing  Goal: Absence of Hospital-Acquired Illness or Injury  Outcome: Progressing  Intervention: Identify and Manage Fall Risk  Recent Flowsheet Documentation  Taken 12/27/2022 2028 by Chauncy Passy, RN  Safety Interventions:   fall reduction program maintained   family at bedside   low bed   lighting adjusted for tasks/safety  Intervention: Prevent Skin Injury  Recent Flowsheet Documentation  Taken 12/27/2022 2028 by Chauncy Passy, RN  Positioning for Skin: Supine/Back  Device Skin Pressure Protection: absorbent pad utilized/changed  Skin Protection: adhesive use limited  Intervention: Prevent and Manage VTE (Venous Thromboembolism) Risk  Recent Flowsheet Documentation  Taken 12/27/2022 2028 by Chauncy Passy, RN  VTE Prevention/Management:   anticoagulant therapy   ambulation promoted  Intervention: Prevent Infection  Recent Flowsheet Documentation  Taken 12/27/2022 2028 by Chauncy Passy, RN  Infection Prevention: hand hygiene promoted  Goal: Optimal Comfort and Wellbeing  Outcome: Progressing  Goal: Readiness for Transition of Care  Outcome: Progressing  Goal: Rounds/Family Conference  Outcome: Progressing     Problem: Self-Care Deficit  Goal: Improved Ability to Complete Activities of Daily Living  Outcome: Progressing     Problem: Fall Injury Risk  Goal: Absence of Fall and Fall-Related Injury  Outcome: Progressing  Intervention: Promote Injury-Free Environment  Recent Flowsheet Documentation  Taken 12/27/2022 2028 by Chauncy Passy, RN  Safety Interventions:   fall reduction program maintained   family at bedside   low bed   lighting adjusted for tasks/safety     Problem: Wound  Goal: Optimal Coping  Outcome: Progressing  Goal: Optimal Functional Ability  Outcome: Progressing  Intervention: Optimize Functional Ability  Recent Flowsheet Documentation  Taken 12/27/2022 2028 by Chauncy Passy, RN  Activity Management: ambulated in room  Goal: Absence of Infection Signs and Symptoms  Outcome: Progressing  Intervention: Prevent or Manage Infection  Recent Flowsheet Documentation  Taken 12/27/2022 2028 by Chauncy Passy, RN  Infection Management: aseptic technique maintained  Goal: Improved Oral Intake  Outcome: Progressing  Goal: Optimal Pain Control and Function  Outcome: Progressing  Goal: Skin Health and Integrity  Outcome: Progressing  Intervention: Optimize Skin Protection  Recent Flowsheet Documentation  Taken 12/27/2022 2028 by Chauncy Passy, RN  Activity Management: ambulated in room  Pressure Reduction Techniques: frequent weight shift encouraged  Head of Bed (HOB) Positioning: HOB at 30-45 degrees  Pressure Reduction Devices: positioning supports utilized  Skin Protection: adhesive use limited  Goal: Optimal Wound Healing  Outcome: Progressing     Problem: Chronic Kidney Disease  Goal: Optimal Coping with Chronic Illness  Outcome: Progressing  Goal: Electrolyte Balance  Outcome: Progressing  Goal: Fluid Balance  Outcome: Progressing  Intervention: Monitor and Manage Hypervolemia  Recent Flowsheet Documentation  Taken 12/27/2022 2028 by Chauncy Passy, RN  Skin Protection: adhesive use limited  Goal: Optimal Functional Ability  Outcome: Progressing  Intervention: Optimize Functional Ability  Recent Flowsheet Documentation  Taken 12/27/2022 2028 by Chauncy Passy, RN  Activity Management: ambulated in room  Goal: Absence of Anemia Signs and Symptoms  Outcome: Progressing  Intervention: Manage Signs of Anemia and  Bleeding  Recent Flowsheet Documentation  Taken 12/27/2022 2028 by Chauncy Passy, RN  Bleeding Precautions: blood pressure closely monitored  Goal: Optimal Oral Intake  Outcome: Progressing  Goal: Acceptable Pain Control  Outcome: Progressing  Goal: Minimize Renal Failure Effects  Outcome: Progressing     Problem: Bleeding Risk or Actual (Thrombocytopenia)  Goal: Absence of Bleeding  Outcome: Progressing  Intervention: Minimize Bleeding Risk  Recent Flowsheet Documentation  Taken 12/27/2022 2028 by Chauncy Passy, RN  Bleeding Precautions: blood pressure closely monitored     Problem: Comorbidity Management  Goal: Blood Glucose Levels Within Targeted Range  Outcome: Progressing  Goal: Blood Pressure in Desired Range  Outcome: Progressing

## 2022-12-28 NOTE — Unmapped (Signed)
IV Abx. Pt is hoping to go home tomorrow. Pt hard hearing. Pt seems a bit inconsistent with report to nursing student and nurse at different time. She reported burning sensation to nursing student, but later tell nurse she doesn't have any symptom. Hat was used in the toilet, but pt dumped herself and wasn't able to record her UOP. Has one small BM today since admission. Walk with standby assist.

## 2022-12-28 NOTE — Unmapped (Signed)
VASCULAR INTERVENTIONAL RADIOLOGY INPATIENT CVC CONSULTATION     Requesting Attending Physician: Nelva Bush, MBBS  Service Requesting Consult: Nephrology (MDB)    Date of Service: 12/28/2022  Consulting Interventional Radiologist: Dr. Claretta Fraise     HPI:     Reason for consult: CVL for long term antibiotics      History of Present Illness:   Julie [name] is a 72 y.o. female with 71 y.o. 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 She has a history of recurring infections including MDR UTI and ESBL E.coli bacteremia treated with IV levofloxacin in January and IV meropenem in March 2024 and April 2024. At the OSH prior to transfer to [facility], she presented with fever, confusion, and vomiting and received empiric vancomycin and cefepime with transition to meropenem on arrival to [facility]. Since receiving Merrem on 6/3, her fevers have not recurred.     Nephrology was consulted and per their note, review of CT report from OSH and [place] at [facility] showed no evidence of abscess but stranding c/w pyelonephritis. 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 Infectious disease following. VIR consulted for placement of a CVC for an extended course of antibiotics per ID recommendations. Her most recent CVC includes tunneled SLPL placed and removed on April 2024 for bacteremia (right external jugular vein). She has a fistula on the left upper extremity.     Currently, Julie [name] is afebrile with no leukocytosis, urine culture positive for E.Coli on 6/3; blood cultures from 6/3 are NGTD. She is hemodynamically stable (though has intermittent hypertension as PMH indicated). She is being treated for this infectious concern with Merrem, and is on abx prophylaxis with kidney transplant of Bactrim (holding nitrofurantoin while on current treatment). Given this transplant history, she is immunosuppressed with tacrolimus and prednisone. Her renal indices are mildly elevated with a Cr 1.3 (baseline 0.9-1.2).      Review of Systems:  Pertinent items are noted in HPI.    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)     since July 2017    Hypertension     Hypothyroidism (acquired)     Kidney transplant status, cadaveric 08/08/2022    Type 2 diabetes mellitus (CMS-HCC)        Surgical History:  Past Surgical History:   Procedure Laterality Date    CATARACT EXTRACTION      HYSTERECTOMY      OOPHORECTOMY      PERITONEAL CATHETER INSERTION      PR LAP INSERTION TUNNELED INTRAPERITONEAL CATHETER N/A 09/27/2018    Procedure: LAPAROSCOPY, SURGICAL; WITH INSERTION OF INTRAPERITONEAL CANNULA OR CATHETER, PERMANENT;  Surgeon: Leona Carry, MD;  Location: MAIN OR [place];  Service: Transplant    PR LAP INSERTION TUNNELED INTRAPERITONEAL CATHETER N/A 04/08/2019    Procedure: LAPAROSCOPY, SURGICAL; WITH INSERTION OF INTRAPERITONEAL CANNULA OR CATHETER, PERMANENT;  Surgeon: Leona Carry, MD;  Location: MAIN OR [facility];  Service: Transplant    PR LAP REVISE INTRAPERITONEAL CATHETER N/A 08/26/2019    Procedure: LAPAROSCOPY,  SURGICAL; W/REVIS PREV PLACED INTRAPERITONEAL CANNULA/CATH, REMOV INTRALUMIN OBSTRUCT MATERIAL;  Surgeon: Leona Carry, MD;  Location: MAIN OR [facility];  Service: Transplant    PR REDUCE VOLVULUS,INTUSS,INTERN HERNIA N/A 10/13/2022    Procedure: REDUCTION OF VOLVULUS, INTUSSUSCEPTION, INTERNAL HERNIA, BY LAPAROTOMY;  Surgeon: Suella Broad, MD;  Location: MAIN OR [place];  Service: Trauma    PR REMOVAL TUNNELED INTRAPERITONEAL CATHETER N/A 05/06/2018 Procedure: REMOVAL OF PERMANENT INTRAPERITONEAL CANNULA OR CATHETER;  Surgeon: Leona Carry, MD;  Location: MAIN OR [facility];  Service: Transplant    PR REMOVE PERITONEAL FOREIGN BODY N/A 04/08/2019    Procedure: Removal Of Peritoneal Of Foreign Body From Peritoneal Cavity;  Surgeon: Leona Carry, MD;  Location: MAIN OR [facility]  Service: Transplant    PR TRANSPLANT,PREP RENAL GRAFT/ARTERIAL Right 08/06/2022    Procedure: [facility] RECONSTRUCTION CADAVER/LIVING DONOR RENAL ALLOGRAFT PRIOR TO TRANSPLANT; ARTERIAL ANASTOMOSIS EAC;  Surgeon: Toledo, Lilyan Punt, MD;  Location: MAIN OR [facility];  Service: Transplant    PR TRANSPLANTATION OF KIDNEY Right 08/06/2022    Procedure: RENAL ALLOTRANSPLANTATION, IMPLANTATION OF GRAFT; WITHOUT RECIPIENT NEPHRECTOMY;  Surgeon: Toledo, Lilyan Punt, MD;  Location: MAIN OR [facility];  Service: Transplant    TUBAL LIGATION         Family History:  Family History   Problem Relation Age of Onset    Diabetes Mother     Alzheimer's disease Father     Diabetes Sister     Diabetes Sister     Diabetes Sister     Anesthesia problems Neg Hx     Bleeding Disorder Neg Hx        Medications:   Current Facility-Administered Medications   Medication Dose Route Frequency Provider Last Rate Last Admin    acetaminophen (TYLENOL) tablet 650 mg  650 mg Oral Q6H PRN Linna Hoff, MD   650 mg at 12/25/22 2053    amlodipine (NORVASC) tablet 5 mg  5 mg Oral Q12H Linna Hoff, MD   5 mg at 12/28/22 4782    [Provider Hold] aspirin chewable tablet 81 mg  81 mg Oral Daily Linna Hoff, MD        atorvastatin (LIPITOR) tablet 10 mg  10 mg Oral Nightly Linna Hoff, MD   10 mg at 12/27/22 2024    carvedilol (COREG) tablet 25 mg  25 mg Oral BID Linna Hoff, MD   25 mg at 12/28/22 0901    dextrose (D10W) 10% bolus 125 mL  12.5 g Intravenous Q10 Min PRN Linna Hoff, MD        glucagon injection 1 mg  1 mg Intramuscular Once PRN Linna Hoff, MD        glucose chewable tablet 16 g  16 g Oral Q10 Min PRN Linna Hoff, MD        heparin (porcine) 5,000 unit/mL injection 5,000 Units  5,000 Units Subcutaneous Q8H [facility] Linna Hoff, MD   5,000 Units at 12/28/22 9562    hydrALAZINE (APRESOLINE) tablet 50 mg  50 mg Oral BID Linna Hoff, MD   50 mg at 12/28/22 0901    insulin lispro (HumaLOG) injection 0-20 Units  0-20 Units Subcutaneous Donald Siva, MD   2 Units at 12/27/22 2334    levothyroxine (SYNTHROID) tablet 112 mcg  112 mcg Oral Daily Linna Hoff, MD   112 mcg at 12/28/22 1308    magnesium oxide-Mg AA chelate (Magnesium Plus Protein) 1 tablet  1 tablet  Oral BID Linna Hoff, MD   1 tablet at 12/28/22 0902    melatonin tablet 6 mg  6 mg Oral QPM Linna Hoff, MD   6 mg at 12/27/22 1824    meropenem (MERREM) 500 mg in sodium chloride 0.9 % (NS) 100 mL IVPB-MBP  500 mg Intravenous Q12H [facility] Nat Math, MD   Stopped at 12/28/22 1026    ondansetron (ZOFRAN-ODT) disintegrating tablet 4 mg  4 mg Oral Q8H PRN Linna Hoff, MD        Or    ondansetron [facility]) injection 4 mg  4 mg Intravenous Q8H PRN Linna Hoff, MD        pantoprazole (Protonix) EC tablet 40 mg  40 mg Oral Daily Linna Hoff, MD   40 mg at 12/28/22 0902    polyethylene glycol (MIRALAX) packet 17 g  17 g Oral Daily Linna Hoff, MD   17 g at 12/27/22 0944    predniSONE (DELTASONE) tablet 5 mg  5 mg Oral Daily Linna Hoff, MD   5 mg at 12/28/22 0901    senna (SENOKOT) tablet 1 tablet  1 tablet Oral Daily PRN Linna Hoff, MD        sodium bicarbonate tablet 650 mg  650 mg Oral TID Linna Hoff, MD   650 mg at 12/28/22 1610    spironolactone (ALDACTONE) tablet 25 mg  25 mg Oral Daily Linna Hoff, MD   25 mg at 12/28/22 9604    sulfamethoxazole-trimethoprim (BACTRIM) 400-80 mg tablet 80 mg of trimethoprim  1 tablet Oral Q MWF Linna Hoff, MD   80 mg of trimethoprim at 12/27/22 0943    tacrolimus (PROGRAF) capsule 1 mg  1 mg Oral Daily Linna Hoff, MD   1 mg at 12/28/22 5409    And    tacrolimus (PROGRAF) capsule 1 mg  1 mg Oral Nightly Linna Hoff, MD   1 mg at 12/27/22 2024       Allergies:  Oxycodone, Gabapentin, and Valacyclovir    Social History:  Social History     Tobacco Use    Smoking status: Former     Current packs/day: 0.00     Average packs/day: 0.8 packs/day for 30.0 years (22.5 ttl pk-yrs)     Types: Cigarettes     Start date: 06/06/1976     Quit date: 06/06/2006     Years since quitting: 16.5    Smokeless tobacco: Former   Substance Use Topics    Alcohol use: Yes     Comment: only special occasions    Drug use: No       Objective:      Vital Signs:  Temp:  [36.6 ??C (97.9 ??F)] 36.6 ??C (97.9 ??F)  Heart Rate:  [66-70] 66  Resp:  [20] 20  BP: (154-173)/(38-52) 173/52  MAP (mmHg):  [71-86] 86  SpO2:  [96 %-98 %] 98 %    Physical Exam:      Vitals:    12/28/22 0900   BP: 173/52   Pulse: 66   Resp:    Temp: 36.6 ??C (97.9 ??F)   SpO2: 98%     ASA Grade: ASA 2 - Patient with mild systemic disease with no functional limitations  General: No apparent distress.  Lungs: Breathing comfortably on RA.  Neuro: No obvious focal deficits.    Airway assessment: Class 3 - Can visualize soft palate    Diagnostic Studies:  None Relavent    Labs:    Recent Labs     12/26/22  0548 12/26/22  1246 12/27/22  0812 12/28/22  1029   WBC 7.3  --  4.3 3.2*   HGB 9.0*  --  8.1* 7.7*   HCT 26.5*  --  23.3* 23.2*   PLT 64* 64* 71* 78*     Recent Labs     12/26/22  0548 12/27/22  0812 12/28/22  1030   NA 137 135 139   K 4.7 4.5 4.9*   CL 109* 108* 108*   BUN 43* 47* 39*   CREATININE 1.27* 1.35* 1.31*   GLU 130 163 197*     Recent Labs     12/26/22  0548 12/27/22  0812 12/28/22  1030   ALBUMIN 3.3* 3.0* 2.9*     Recent Labs     12/26/22  1246   INR 0.98   APTT 32.1   FIBRINOGEN 593*       Blood Cultures Pending:  Yes.    Assessment and Recommendations:     Julie [name] is a 72 y.o. female with recurrent graft pyelonephritis that will require an extended IV antibiotic course. VIR has been consulted for CVC placement.     Recommendations:  - Proceed with placement of Power Line - single lumen; most recent CVC was the right external jugular vein in April 2024. Left upper extremity fistula present.   - Anticipated procedure date: 6/7  - Anticoagulant medication hold required: Please continue to hold Aspirin and do not restart.   - Please make NPO night prior to procedure  - Please ensure recent CBC, Creatinine, and INR are available  - Antibiotic required: No. Currently on abx.       Informed Consent:  This procedure and sedation has been fully reviewed with the patient/patient???s authorized representative. The risks, benefits and alternatives including but not limited to infection, bleeding, damage to adjacent structures have been explained, and the patient/patient???s authorized representative has consented to the procedure.  --The patient will accept blood products in an emergent situation.  --The patient does not have a Do Not Resuscitate order in effect.    Consent obtained by Alease Frame NP, witnessed, and scanned into the medical record.     Thank you for involving [place] in the care of this patient. Please page the VIR consult pager [phone]) with further questions, concerns, or if new issues arise.

## 2022-12-28 NOTE — Unmapped (Signed)
IV covered and telemetry removed for patient to shower.

## 2022-12-28 NOTE — Unmapped (Signed)
IMMUNOCOMPROMISED HOST INFECTIOUS DISEASE PROGRESS NOTE    Assessment/Plan:     Julie [name] is a 72 y.o. 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 Had perinephric drain in place postoperatively, removed 08/30/22  - Immunosuppression: Tacrolimus (goal 6-8), MMF 500 BID, Pred 5  - Prophylaxis: valganciclovir x 3 months (mod risk, completed), TMP+SMX x 6 months  Estimated Creatinine Clearance: 29.8 mL/min (A) (based on SCr of 1.31 mg/dL (H)).         Pertinent comorbidities:  ESRD was on PD and HD  DM II (A1c 5.8 on 12/07/22)  CAD  HLD  SBO 10/07/22 s/p ex lap 10/13/22 w/ lysis of adhesions     Pertinent exposures:  Originally from [place]   Treated with ivermectin at the ID clinic in 2018  Increased amount of gardening and outdoor exposures over past several months     Summary of pertinent prior infections:  #History of Shingles 2017  #04/2018 Dialysis fluid [place] R: Ampicillin, Unasyn, Cefazolin,S: Ceftriaxone, Gentamicin, Levo, PipTazo, Tobra  #Thrush w/presumed candidal esophagitis 10/10/22 s/p 14 day treatment fluconazole  #Donor urine cx with C. Dubliensis 08/05/22, <10K, negative recipient samples, negative donor blood cx, treated with 2 weeks PO fluconazole  # Recurrent MDR E.Coli Bacteremia/UTI  - 1/29 BCx/UCx E. Coli - s/p Levo x 3 weeks (thru stent removal, 3/15) (S: Cefazolin, Cipro/Levo, Pip-tazo, Cephalexin; R: TMP-SMX, Amp, Amp-sulb)  - 3/14 BCx/UCx ESBL/FQ-R E. Coli s/p 14 days Meropenem; also with perinephric fluid collection; (S: Erta, Gent, Mero, Tobra, Cefepime SDD; I: ceftaz; R: Cipro/Levo, Cefazolin, Ceftriaxone, Amp)  - 4/13 Bcx/Ucx E. Coli (R CFZ, CTX, cipro/levo, I ceftaz, SDD CFP, susc nitro). Treated vanc/mero -> mero x 2 weeks  - 4/29 started on nitrofurantoin prophylaxis  - 5/7 seen by urogyn, no structural issues planned to stop nitrofurantoin with switch to methenamine (not done), added vaginal estrogen/d-mannose.     #Perinephric fluid collection 10/05/22  - 3/19 IR guided drainage negative cx, Cr 1.5 not suggestive of urinoma  - 4/13 increased stranding, 4/16 not amenable to intervention per VIR     Active infection:  #Concern for recurrent MDR E. Coli UTI, despite nitrofurantoin ppx, transplant pyelonephritis 12/25/22  - 6/3 presented to [facility] with fever, n/v. No urinary symptoms, no cough or URI symptoms  - 6/3 UA (cath) trace leuks, few bacteria, no UCx; BCx (NGTD); RSV/Flu/Covid neg  - 6/3 OSH CT AP: mild transplant perinephric edema, no hydronephrosis, no fluid collections  - 6/3 [place] BCx (NGTD), CMV/EBV/BK PCR neg; Crypto Ag neg UCx <10,000 E. Coli (S: Cefepime SDD; Erta/Mero, Gent, Nitrofurantoin, Tobra)  - 6/3 US renal txp: No solid masses or calculi. No perinephric collections identified. 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  - 06/06/22 hepB surface Ab positive, surface Ag neg  - 08/11/22 HBV DNA not detected  - 09/06/22 HBV DNA not detected  - 6/6 HBV DNA and HBsAg pending     Antimicrobial allergy/intolerance:   Penicillin (see allergy list for nuances)  Valacyclovir - nausea         RECOMMENDATIONS  Diagnosis  F/u 6/3 [place] BCx (NG 48 hrs as of 12/27/22 per microlab)  F/u 6/3 BCx (NGTD)  F/u 6/6 serum adenovirus, HBV DNA and HBsAg    Management  Continue IV meropenem, renal dosed equivalent of 1g q8h (renal dose is 500mg  q12h)  Okay to use Ertapenem on discharge for easy of administration   Duration: 21 days (6/4-6/24)    Antimicrobial prophylaxis required for transplant immunosuppression   Viral: Completed valcyte x 3 months  PJP: Continue TMP+SMX renally dosed equivalent 400/80 MWF x 6 months  UTI ppx: HOLD nitrofurintoin while on treatment above    Intensive toxicity monitoring for prescription antimicrobials   CBC w/diff at least once per week  CMP at least once per week  clinical assessments for rashes or other skin changes  Given hepB core+, needs LFTs q74m for first year, HBSAg and HBV DNA q67m for first year and with any rise in LFTs     Solid Organ Transplant Infectious Diseases Follow-up Instructions  Appointment: 01/18/23 at 11:00am  Location: [facility], [address], [place], [place]  Labs: weekly CBC with differential, CMP  Please fax labs to patient???s transplant coordinator: Celene Squibb at  [phone] (Kidney/Pancreas) and to [employer] (ICHID pharmD at [phone])   Antibiotics:   Ertapenem IV 1g daily     Antibiotic End Date: 01/15/23    The [facility] ID APP service will continue to follow.           Please page Darolyn Rua, NP at [phone] from 8-4:30pm, after 4:30 pm & on weekends, please page the ID Transplant/Liquid Oncology Fellow consult at [phone] with questions.    Patient discussed with Dr.  Para March .      Darolyn Rua, MSN, APRN, AGNP-C  Immunocompromised Infectious Disease Nurse Practitioner    I personally spent 10 minutes face-to-face and non-face-to-face in the care of this patient, which includes all pre, intra, and post visit time on the date of service.  All documented time was specific to the E/M visit and does not include any procedures that may have been performed.    Subjective:     External record(s): Consultant note(s): VIR note reviewed and noted most recent CVC was placed in the right external jugular vein in April 2024.  .    Independent historian(s): no independent historian required.       Interval History:   Afebrile and NAEON. Patient denies dysuria, discharge, shortness of breath, n/v/d.     Medications:  Current Medications as of 12/28/2022  Scheduled  PRN   amlodipine, 5 mg, Q12H  [Provider Hold] aspirin, 81 mg, Daily  atorvastatin, 10 mg, Nightly  carvedilol, 25 mg, BID  heparin (porcine) for subcutaneous use, 5,000 Units, Q8H SCH  hydrALAZINE, 50 mg, BID  insulin lispro, 0-20 Units, ACHS  levothyroxine, 112 mcg, Daily  magnesium oxide-Mg AA chelate, 1 tablet, BID  melatonin, 6 mg, QPM  meropenem, 500 mg, Q12H SCH  pantoprazole, 40 mg, Daily  polyethylene glycol, 17 g, Daily  predniSONE, 5 mg, Daily  sodium bicarbonate, 650 mg, TID  spironolactone, 25 mg, Daily  sulfamethoxazole-trimethoprim, 1 tablet, Q MWF  tacrolimus, 1 mg, Daily   And  tacrolimus, 1 mg, Nightly      acetaminophen, 650 mg, Q6H PRN  dextrose in water, 12.5 g, Q10 Min PRN  glucagon, 1 mg, Once PRN  glucose, 16 g, Q10 Min PRN  ondansetron, 4 mg, Q8H PRN   Or  ondansetron, 4 mg, Q8H PRN  senna, 1 tablet, Daily PRN         Objective:     Vital Signs last 24 hours:  Temp:  [36.6 ??C (97.9 ??F)] 36.6 ??C (97.9 ??F)  Heart Rate:  [66-70] 66  Resp:  [20] 20  BP: (154-173)/(38-52) 173/52  MAP (mmHg):  [71-86] 86  SpO2:  [96 %-98 %] 98 %    Physical Exam:   Patient Lines/Drains/Airways Status       Active Active Lines, Drains, & Airways       Name Placement date Placement time Site Days    Peripheral IV 12/25/22 Right Antecubital 12/25/22  0800  Antecubital  3    Arteriovenous Fistula - Vein Graft  Access 08/24/16 Left;Upper Arm 08/24/16  --  Arm  2317                  Const [x]  vital signs above    []  NAD, non-toxic appearance []  Chronically ill-appearing, non-distressed  Sitting in bed; daughter at bedside      Eyes [x]  Lids normal bilaterally, conjunctiva anicteric and noninjected OU     [] PERRL  [] EOMI        ENMT [x]  Normal appearance of external nose and ears, no nasal discharge        [x]  MMM, no lesions on lips or gums [x]  No thrush, leukoplakia, oral lesions  []  Dentition good []  Edentulous []  Dental caries present  []  Hearing normal  []  TMs with good light reflexes bilaterally         Neck [x]  Neck of normal appearance and trachea midline        []  No thyromegaly, nodules, or tenderness   []  Full neck ROM        Lymph []  No LAD in neck     []  No LAD in supraclavicular area     []  No LAD in axillae   []  No LAD in epitrochlear chains     []  No LAD in inguinal areas        CV []  RRR            []  No peripheral edema     []  Pedal pulses intact   []  No abnormal heart sounds appreciated   [x]  Extremities WWP   Murmur present      Resp [x]  Normal WOB at rest    []  No breathlessness with speaking, no coughing  []  CTA anteriorly    []  CTA posteriorly          GI [x]  Normal inspection, NTND   []  NABS     []  No umbilical hernia on exam       []  No hepatosplenomegaly     []  Inspection of perineal and perianal areas normal  No pain with palpation over graft site      GU []  Normal external genitalia     [] No urinary catheter present in urethra   []  No CVA tenderness    []  No tenderness over renal allograft        MSK []  No clubbing or cyanosis of hands       []  No vertebral point tenderness  []  No focal tenderness or abnormalities on palpation of joints in RUE, LUE, RLE, or LLE        Skin [x]  No rashes, lesions, or ulcers of visualized skin     []  Skin warm and dry to palpation         Neuro [x]  Face expression symmetric  []   Sensation to light touch grossly intact throughout    []  Moves extremities equally    []  No tremor noted        []  CNs II-XII grossly intact     []  DTRs normal and symmetric throughout []  Gait unremarkable        Psych [x]  Appropriate affect       []  Fluent speech         [x]  Attentive, good eye contact  [x]  Oriented to person, place, time          []  Judgment and insight are appropriate           Data for Medical Decision Making     12/25/22 EKG QTcF    I discussed mgm't w/qualified health care professional(s) involved in case: discussed with primary team that Ertapenem can be ordered for discharge as it is only given once a day .    I reviewed CBC results (WBC decreased and low today with slight improvement in platelets), chemistry results (Hyperkalemia today with slight improvement in creatinine), and Mag/phos WNL .    I independently visualized/interpreted not done.       Recent Labs     Units 12/28/22  1029 12/28/22  1030   WBC 10*9/L 3.2*  --    HGB g/dL 7.7*  --    PLT 81*1/B 78*  --    NEUTROABS 10*9/L 2.3  --    LYMPHSABS 10*9/L 0.5*  --    EOSABS 10*9/L 0.1  --    BUN mg/dL  --  39*   CREATININE mg/dL  --  1.47*   K mmol/L  --  4.9*   MG mg/dL  --  1.8   PHOS mg/dL  --  2.4   CALCIUM mg/dL  --  8.6*     Lab Results   Component Value Date    CMVLR Not Detected 12/25/2022         Microbiology:  Microbiology Results (last day)       Procedure Component Value Date/Time Date/Time    Blood Culture, Adult [[id number]]  (Normal) Collected: 12/25/22 2110    Lab Status: Preliminary result Specimen: Blood from 1 Peripheral Draw Updated: 12/27/22 2145     Blood Culture, Routine No Growth at 48 hours    Blood Culture, Adult [[id number]]  (Normal) Collected: 12/25/22 2110    Lab Status: Preliminary result Specimen: Blood from 1 Peripheral Draw Updated: 12/27/22 2145     Blood Culture, Routine No Growth at 48 hours                 Imaging:  No new imaging

## 2022-12-29 ENCOUNTER — Telehealth: Payer: Self-pay

## 2022-12-29 LAB — BASIC METABOLIC PANEL
ANION GAP: 4 mmol/L — ABNORMAL LOW (ref 5–14)
BLOOD UREA NITROGEN: 42 mg/dL — ABNORMAL HIGH (ref 9–23)
BUN / CREAT RATIO: 34
CALCIUM: 8.9 mg/dL (ref 8.7–10.4)
CHLORIDE: 109 mmol/L — ABNORMAL HIGH (ref 98–107)
CO2: 25 mmol/L (ref 20.0–31.0)
CREATININE: 1.22 mg/dL — ABNORMAL HIGH
EGFR CKD-EPI (2021) FEMALE: 47 mL/min/{1.73_m2} — ABNORMAL LOW (ref >=60)
GLUCOSE RANDOM: 188 mg/dL — ABNORMAL HIGH (ref 70–179)
POTASSIUM: 5.5 mmol/L — ABNORMAL HIGH (ref 3.4–4.8)
SODIUM: 138 mmol/L (ref 135–145)

## 2022-12-29 LAB — CBC
HEMATOCRIT: 23 % — ABNORMAL LOW (ref 34.0–44.0)
HEMOGLOBIN: 7.7 g/dL — ABNORMAL LOW (ref 11.3–14.9)
MEAN CORPUSCULAR HEMOGLOBIN CONC: 33.5 g/dL (ref 32.0–36.0)
MEAN CORPUSCULAR HEMOGLOBIN: 32.5 pg — ABNORMAL HIGH (ref 25.9–32.4)
MEAN CORPUSCULAR VOLUME: 96.8 fL — ABNORMAL HIGH (ref 77.6–95.7)
MEAN PLATELET VOLUME: 8.6 fL (ref 6.8–10.7)
PLATELET COUNT: 103 10*9/L — ABNORMAL LOW (ref 150–450)
RED BLOOD CELL COUNT: 2.37 10*12/L — ABNORMAL LOW (ref 3.95–5.13)
RED CELL DISTRIBUTION WIDTH: 13.6 % (ref 12.2–15.2)
WBC ADJUSTED: 2.9 10*9/L — ABNORMAL LOW (ref 3.6–11.2)

## 2022-12-29 LAB — LACTATE DEHYDROGENASE: LACTATE DEHYDROGENASE: 212 U/L (ref 120–246)

## 2022-12-29 LAB — IRON PANEL
IRON SATURATION: 51 % (ref 20–55)
IRON: 114 ug/dL
TOTAL IRON BINDING CAPACITY: 222 ug/dL — ABNORMAL LOW (ref 250–425)

## 2022-12-29 LAB — RENAL FUNCTION PANEL
ALBUMIN: 2.9 g/dL — ABNORMAL LOW (ref 3.4–5.0)
ANION GAP: 5 mmol/L (ref 5–14)
BLOOD UREA NITROGEN: 38 mg/dL — ABNORMAL HIGH (ref 9–23)
BUN / CREAT RATIO: 33
CALCIUM: 8.6 mg/dL — ABNORMAL LOW (ref 8.7–10.4)
CHLORIDE: 110 mmol/L — ABNORMAL HIGH (ref 98–107)
CO2: 26 mmol/L (ref 20.0–31.0)
CREATININE: 1.16 mg/dL — ABNORMAL HIGH
EGFR CKD-EPI (2021) FEMALE: 50 mL/min/{1.73_m2} — ABNORMAL LOW (ref >=60)
GLUCOSE RANDOM: 230 mg/dL — ABNORMAL HIGH (ref 70–179)
PHOSPHORUS: 2.7 mg/dL (ref 2.4–5.1)
POTASSIUM: 5.4 mmol/L — ABNORMAL HIGH (ref 3.5–5.1)
SODIUM: 141 mmol/L (ref 135–145)

## 2022-12-29 LAB — MAGNESIUM: MAGNESIUM: 1.7 mg/dL (ref 1.6–2.6)

## 2022-12-29 LAB — CBC W/ AUTO DIFF
BASOPHILS ABSOLUTE COUNT: 0 10*9/L (ref 0.0–0.1)
BASOPHILS RELATIVE PERCENT: 0.4 %
EOSINOPHILS ABSOLUTE COUNT: 0 10*9/L (ref 0.0–0.5)
EOSINOPHILS RELATIVE PERCENT: 1.3 %
HEMATOCRIT: 23.6 % — ABNORMAL LOW (ref 34.0–44.0)
HEMOGLOBIN: 8.2 g/dL — ABNORMAL LOW (ref 11.3–14.9)
LYMPHOCYTES ABSOLUTE COUNT: 0.5 10*9/L — ABNORMAL LOW (ref 1.1–3.6)
LYMPHOCYTES RELATIVE PERCENT: 14.4 %
MEAN CORPUSCULAR HEMOGLOBIN CONC: 34.6 g/dL (ref 32.0–36.0)
MEAN CORPUSCULAR HEMOGLOBIN: 33.6 pg — ABNORMAL HIGH (ref 25.9–32.4)
MEAN CORPUSCULAR VOLUME: 97.1 fL — ABNORMAL HIGH (ref 77.6–95.7)
MEAN PLATELET VOLUME: 8.3 fL (ref 6.8–10.7)
MONOCYTES ABSOLUTE COUNT: 0.3 10*9/L (ref 0.3–0.8)
MONOCYTES RELATIVE PERCENT: 7.5 %
NEUTROPHILS ABSOLUTE COUNT: 2.6 10*9/L (ref 1.8–7.8)
NEUTROPHILS RELATIVE PERCENT: 76.4 %
PLATELET COUNT: 93 10*9/L — ABNORMAL LOW (ref 150–450)
RED BLOOD CELL COUNT: 2.43 10*12/L — ABNORMAL LOW (ref 3.95–5.13)
RED CELL DISTRIBUTION WIDTH: 13.4 % (ref 12.2–15.2)
WBC ADJUSTED: 3.4 10*9/L — ABNORMAL LOW (ref 3.6–11.2)

## 2022-12-29 LAB — ADENOVIRUS PCR BLOOD: ADENO VIRAL LOAD: NOT DETECTED

## 2022-12-29 LAB — HEPATITIS B SURFACE ANTIGEN: HEPATITIS B SURFACE ANTIGEN: NONREACTIVE

## 2022-12-29 LAB — HEPATITIS B DNA, QUANTITATIVE, PCR: HBV DNA QUANT: NOT DETECTED

## 2022-12-29 LAB — FERRITIN: FERRITIN: 1613.9 ng/mL — ABNORMAL HIGH

## 2022-12-29 LAB — CULTURE, BLOOD (ROUTINE X 2)

## 2022-12-29 MED ORDER — INSULIN GLARGINE (U-100) 100 UNIT/ML (3 ML) SUBCUTANEOUS PEN
Freq: Every evening | SUBCUTANEOUS | 0 refills | 30 days
Start: 2022-12-29 — End: 2023-01-28

## 2022-12-29 MED ORDER — AMLODIPINE 5 MG TABLET
ORAL_TABLET | Freq: Two times a day (BID) | ORAL | 3 refills | 45.00000 days | Status: CN
Start: 2022-12-29 — End: 2023-12-29

## 2022-12-29 MED ORDER — SODIUM BICARBONATE 650 MG TABLET
ORAL_TABLET | Freq: Three times a day (TID) | ORAL | 0 refills | 30 days
Start: 2022-12-29 — End: 2023-01-28

## 2022-12-29 MED ADMIN — ertapenem (INVANZ) 500 mg in sodium chloride (NS) 0.9 % 50 mL IVPB: 500 mg | INTRAVENOUS | @ 11:00:00 | Stop: 2022-12-29

## 2022-12-29 MED ADMIN — polyethylene glycol (MIRALAX) packet 17 g: 17 g | ORAL | @ 14:00:00

## 2022-12-29 MED ADMIN — carvedilol (COREG) tablet 25 mg: 25 mg | ORAL | @ 01:00:00

## 2022-12-29 MED ADMIN — levothyroxine (SYNTHROID) tablet 112 mcg: 112 ug | ORAL | @ 10:00:00

## 2022-12-29 MED ADMIN — atorvastatin (LIPITOR) tablet 10 mg: 10 mg | ORAL | @ 01:00:00

## 2022-12-29 MED ADMIN — melatonin tablet 6 mg: 6 mg | ORAL | @ 01:00:00

## 2022-12-29 MED ADMIN — sodium zirconium cyclosilicate (LOKELMA) packet 10 g: 10 g | ORAL | @ 19:00:00 | Stop: 2022-12-29

## 2022-12-29 MED ADMIN — fentaNYL (PF) (SUBLIMAZE) injection: INTRAVENOUS | @ 13:00:00 | Stop: 2022-12-29

## 2022-12-29 MED ADMIN — magnesium oxide-Mg AA chelate (Magnesium Plus Protein) 1 tablet: 1 | ORAL | @ 01:00:00

## 2022-12-29 MED ADMIN — tacrolimus (PROGRAF) capsule 1 mg: 1 mg | ORAL | @ 14:00:00

## 2022-12-29 MED ADMIN — pantoprazole (Protonix) EC tablet 40 mg: 40 mg | ORAL | @ 14:00:00

## 2022-12-29 MED ADMIN — tacrolimus (PROGRAF) capsule 1 mg: 1 mg | ORAL | @ 01:00:00

## 2022-12-29 MED ADMIN — spironolactone (ALDACTONE) tablet 25 mg: 25 mg | ORAL | @ 14:00:00

## 2022-12-29 MED ADMIN — magnesium oxide-Mg AA chelate (Magnesium Plus Protein) 1 tablet: 1 | ORAL | @ 14:00:00

## 2022-12-29 MED ADMIN — sodium bicarbonate tablet 650 mg: 650 mg | ORAL | @ 01:00:00

## 2022-12-29 MED ADMIN — predniSONE (DELTASONE) tablet 5 mg: 5 mg | ORAL | @ 14:00:00

## 2022-12-29 MED ADMIN — carvedilol (COREG) tablet 25 mg: 25 mg | ORAL | @ 14:00:00

## 2022-12-29 MED ADMIN — midazolam (VERSED) injection 1 mg: 1 mg | INTRAVENOUS | @ 12:00:00 | Stop: 2022-12-29

## 2022-12-29 MED ADMIN — midazolam (VERSED) injection: INTRAVENOUS | @ 13:00:00 | Stop: 2022-12-29

## 2022-12-29 MED ADMIN — hydrALAZINE (APRESOLINE) tablet 50 mg: 50 mg | ORAL | @ 14:00:00

## 2022-12-29 MED ADMIN — sulfamethoxazole-trimethoprim (BACTRIM) 400-80 mg tablet 80 mg of trimethoprim: 1 | ORAL | @ 14:00:00 | Stop: 2023-04-02

## 2022-12-29 MED ADMIN — sodium bicarbonate tablet 650 mg: 650 mg | ORAL | @ 19:00:00

## 2022-12-29 MED ADMIN — sodium bicarbonate tablet 650 mg: 650 mg | ORAL | @ 14:00:00

## 2022-12-29 MED ADMIN — lidocaine (XYLOCAINE) 10 mg/mL (1 %) injection: INTRADERMAL | @ 13:00:00 | Stop: 2022-12-29

## 2022-12-29 MED ADMIN — heparin lock flush (porcine) 100 unit/mL injection: INTRAVENOUS | @ 13:00:00 | Stop: 2022-12-29

## 2022-12-29 MED ADMIN — amlodipine (NORVASC) tablet 5 mg: 5 mg | ORAL | @ 14:00:00

## 2022-12-29 MED ADMIN — amlodipine (NORVASC) tablet 5 mg: 5 mg | ORAL | @ 01:00:00

## 2022-12-29 MED ADMIN — insulin lispro (HumaLOG) injection 0-20 Units: 0-20 [IU] | SUBCUTANEOUS | @ 05:00:00

## 2022-12-29 MED ADMIN — insulin lispro (HumaLOG) injection 0-20 Units: 0-20 [IU] | SUBCUTANEOUS | @ 22:00:00

## 2022-12-29 MED ADMIN — hydrALAZINE (APRESOLINE) tablet 50 mg: 50 mg | ORAL | @ 01:00:00

## 2022-12-29 MED ADMIN — melatonin tablet 6 mg: 6 mg | ORAL | @ 21:00:00

## 2022-12-29 NOTE — Telephone Encounter (Signed)
Transition Care Management Unsuccessful Follow-up Telephone Call  Date of discharge and from where:  12/25/2022  [facility]  Attempts:  1st Attempt  Reason for unsuccessful TCM follow-up call:  No answer/busy  Bethany [place]  [facility] Community Resource Care Guide   [email or web address]  ?? [phone]   [email or web address]: [email or web address]  [email or web address]      

## 2022-12-29 NOTE — Unmapped (Incomplete)
Pt has power line this morning. Slight bleed on the site but no dressing change needed at this time. Tacrolimus wasn't drawn before meds given after she came back from [facility]. Doctor notified. Home infusion nurse came bedside and educated husband to do home Abx infusion. K+ is high today and prevented pt from being discharged.

## 2022-12-29 NOTE — Unmapped (Cosign Needed)
Nephrology ([facility]) Progress Note    Assessment & Plan:   Julie [name] is a 72 y.o. 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     Principal Problem (Resolved):    Fever  Active Problems:    Type 2 diabetes mellitus with diabetic nephropathy, with long-term current use of insulin (CMS-HCC)    Hypothyroidism, acquired    Immunosuppressed status (CMS-HCC)    Hypertension    Hyperlipidemia    Anemia of chronic disease    Chronic idiopathic thrombocytopenia (CMS-HCC)    Pseudothrombocytopenia  Resolved Problems:    UTI (urinary tract infection)    Delirium    Active Problems    Acute Fever in Immunocompromised Host - Hx of recurrent MDR UTIs and Bacteremia   Patient has remained afebrile on meropenem since 6/3 evening. Repeat Ucx at [facility] showing E.coli <10,000 though not significant enough to reliably label as source of infection. Suspect low Ucx bacteria counts due to prophylaxis as above. BCX NGTD at 48 hours at both OSH and [facility]. Will pursue treatment for presumed graft pyelonephritis in absence of clear infectious source with antibiotic course of 21 days. Given known MDR E.coli, will need IV antibiotics. VIR placement of powerline for home antibiotic administration.  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 Elvera Maria. Lowest Cr ~0.95 after DDKT, presumed new baseline between 0.9-1.2. Cr 1.26 at baseline at OSH and stable at [facility] ~Cr 1.2-1.3 with anticipated lab margin of error. UOP limited today as hat is not consistently being utilized.  - Continue Bicarb 650mg  TID  - Tacrolimus 1mg  BID  - Prednisone 5mg  daily  - Bactrim 400-80mg  MWF for PJP ppx  - Protonix 40mg  for PPI ppx  - HOLD Cellcept 500 mg BID in setting of fever and UTI, will resume on discharge  - Held ASA 81 mg for powerline placement, will resume on discharge  - Monitor tac trough MWF  - Daily RFP, Mg, Phos  - Strict I/Os    Hyperkalemia  Patient had elevated K 5.4 on 6/7. Giving patient 1x lokelma. Will given another dose of lokelma if K > 5.4 on repeat.  - F/up repeat BMP this evening    Acute Pseudothrombocytopenia on Chronic Thrombocytopenia  Known chronic thrombocytopenia ~110-130, acutely dropped to Plt 64 on 6/4.   Smear indicative of pseudothrombocytopenia, unrevealing for pathological process. Platelet improving daily, consistent with trend from prior infections.  - CBC daily to trend plt     Hx of Acute Metabolic Encephalopathy with fever  Presented with hypoactive and hyperactive delirium on admission. Now oriented and alert x4. Has not been febrile since 6/3 evening.  - Continue delirium precautions     Chronic Problems     HTN:   - Continued home amlodipine 5mg  BID  - Continue home carvedilol 25mg  BID   - Continue home hydralazine 50mg  BID  - Continue home spironolactone 25mg  daily   - Discontinue home chlorthalidone 25mg  BID    HLD: Continued home statin 10mg  daily     T2DM, insulin dependent: 12/07/22 HbA1C 5.8. On 6 units nightly Lantus and meal time insulin at home.  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 Hgb 8-9 this admission. Hgb ~10 at previous admission. Iron panel and ferritin labs consistent with anemia of chronic disease, reassuring against acute blood loss.     Hypothyroidism: levothyroxine 112 mcg daily  Sleeping: melatonin 6mg  nightly      Issues Impacting Complexity of Management:  -Intensive monitoring of drug toxicity from Vancomycin with scheduled BMP and/or Vancomycin levels and Tacrolimus with scheduled levels  -The patient is at high risk from Hospital immobility in an elderly patient given baseline poor functional status with a high risk of causing delirium and further decline in function      Medical Decision Making: Reviewed records from the following unique sources  [facility].      Daily Checklist:  Diet: Regular Diet  DVT PPx: Heparin 5000units q8h, held overnight for procedure this AM and restarted once back on floor  Electrolytes: Replete Potassium to >/=4 and Magnesium to >/=2  Code Status: Full Code  Dispo: Home    Team Contact Information:   Primary Team: Nephrology ([facility])  Primary Resident: [name]  Resident's Pager: [phone] (Nephrology Intern - Alvester Morin)    Interval History:   No acute events overnight. Patient is much more alert today and inquiring to go home. She is feeling well and does not have any complaints. She received a powerline today without any pain at the site.    ROS: Denies headache, chest pain, shortness of breath, abdominal pain, nausea, vomiting.    Objective:   Temp:  [36.7 ??C (98.1 ??F)] 36.7 ??C (98.1 ??F)  Heart Rate:  [66-78] 78  Resp:  [13-21] 20  BP: (141-194)/(46-54) 141/52  SpO2:  [90 %-100 %] 98 %    Gen: NAD, converses, sitting in bed  HENT: atraumatic, normocephalic  Heart: RRR  Lungs: CTAB, no crackles or wheezes  Abdomen: soft, NTND, no CVA tenderness  Extremities: No edema    [name], MS4    I attest that I have reviewed the student note and that the components of the history of the present illness, the physical exam, and the assessment and plan documented, were performed by me or were performed in my presence, by the student, where I verified the documentation and performed (or re-performed) the exam and medical decision making.      Franchot Gallo, MD

## 2022-12-29 NOTE — Unmapped (Signed)
Physician Discharge Summary [facility]  3 [facility]  [address]  [place] [address]  Dept: [phone]  Loc: [phone]     Identifying Information:   Julie [name]  02-16-1951  [id number]    Primary Care Physician: Barbette Reichmann, MD     Code Status: Full Code    Admit Date: 12/25/2022    Discharge Date: 12/30/2022     Discharge To: Home    Discharge Service: [facility] - Nephrology Floor Team (MED B Alvester Morin)     Discharge Attending Physician: Nelva Bush, MBBS    Discharge Diagnoses:   Principal Problem (Resolved):    Fever (POA: Yes)  Active Problems:    Type 2 diabetes mellitus with diabetic nephropathy, with long-term current use of insulin (CMS-HCC) (POA: Not Applicable)    Hypothyroidism, acquired (POA: Yes)    Immunosuppressed status (CMS-HCC) (POA: Yes)    Hypertension (POA: Unknown)    Hyperlipidemia (POA: Unknown)    Anemia of chronic disease (POA: Unknown)    Chronic idiopathic thrombocytopenia (CMS-HCC) (POA: Unknown)    Pseudothrombocytopenia (POA: Unknown)  Resolved Problems:    UTI (urinary tract infection) (POA: Yes)    Delirium (POA: Yes)    Outpatient Provider Follow Up Issues:   [ ]  Check LFTs q83m for first year given hepB core+  [ ]  Check HBSAg and HBV DNA q69m for first year and with any rise in LFTs   [ ]  Weekly CBC with Diff, CMP   - check platelets until return to baseline given acute pseudothrombocytopenia   [ ]  Held aspirin due to thrombocytopenia on DC, consider restarting when able    Hospital Course:     Kidney transplant coordinator: Julie [name] Julie [name] is a 72 y.o. female whose presentation is complicated by s/p DDKT 08/06/22, HTN, HLD, T2DM, recent hx candida esophagitis, AoCD, thrombocytopenia that presented to [facility] from OSH with fever. She has had several MDR UTI with bacteremia necessitating IV antibiotics since January. Given previous susceptibilities and response to meropenem, started her empirically while awaiting infectious source. Given negative infectious workup with mild Ucx with <10,000 E.coli, treating patient with 21 day antiobiotics course with presumed graft pyelonephritis as presumed. Patient with powerline for antiobiotic administration at home.    Acute Fever in Immunocompromised Host - Hx of recurrent MDR UTIs and Bacteremia   Presenting with confusion, fever, and vomiting x1. Labs remarkable for UA with leukocytes, no leukocytosis (c/w prior presentations), normal lactate, negative RPP.   Had several recent admissions s/p DDKT for MDR UTI and ESBL E.coli bacteremia treated with IV levofloxacin in January and IV meropenem in March 2024 and April 2024. Was on UTI prophylaxis with nitrofurantoin, estradiol cream, cranberry, and d-mannose. Received empiric vancomycin and cefepime at OSH and transitioned to meropenem and continued vancomycin at [facility]. Patient has remained afebrile on meropenem since 6/3 evening. Repeat Ucx at [facility] showing E.coli <10,000 though not significant enough to reliably label as source of infection. Suspect low Ucx bacteria counts due to prophylaxis as above. BCX NGTD at 48 hours at both OSH and [facility]. ICID consulted. Will pursue treatment for presumed graft pyelonephritis in absence of clear infectious source with antibiotic course of 21 days. Given known MDR E.coli, will need IV antibiotics. Transitioned to IV ertapenem 1g daily instead of meropenem BID for amenable home administration. Received powerline with VIR on 6/7 for 21 day home antibiotic regimen.    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 Elvera Maria. Lowest Cr ~0.95 after DDKT, presumed new baseline between 0.9-1.2. Cr 1.26 at baseline at OSH and stable at [facility] ~Cr 1.2-1.3 with anticipated lab margin of error. Continued home tacrolimus 1mg  BID, prednisone 5mg  daily, bactrim 400-80mg  MWF for PJP prophylaxis, protonox 40mg  PPI px. Held cellcept 500mg  BID in setting of infection of unknown origin, restarted on discharge. ASA 81 mg also held for anticipated procedure, restarted on discharge. Tac was monitored MWF and adjusted accordingly.     Acute Pseudothrombocytopenia on Chronic Thrombocytopenia  Known chronic thrombocytopenia ~110-130, acutely dropped to Plt 64 on 6/4. Smear indicative of pseudothrombocytopenia, unrevealing for pathological process. Of note, patient has had similar drop in platelets during previous admissions for bacteremia with resolution back to baseline. Platelets improving daily, and trended up close to baseline on day of discharge. Held aspirin on DC.      Acute Metabolic Encephalopathy  Presented with hypoactive and hyperactive delirium on admission. Previous history of Hypoactive and Hyperactive delirium with bacteremia. Per daughter, patient is very affected by fevers. OSH CT head w/o contrast with no acute findings. Now oriented and alert x4. Has not been febrile since 6/3 evening. Was on delirium precautions during admission.    Chronic Problems    HTN: Continued home amlodipine 5mg  BID, home carvedilol 25mg  BID, home hydralazine 50mg  BID, and continue home spironolactone 25mg  daily. Held home chlorthalidone 25mg  BID given concern for bacteremia with hypotension, restarted on discharge.     HLD: Continued home statin 10mg  daily     T2DM, insulin dependent: 12/07/22 HbA1C 5.8. On 6 units nightly Lantus and meal time insulin at home. Has only needed lispro 3 units while inpatient. Discharged on lantus 3 units.    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 Hgb 8-9 this admission. Hgb ~10 at previous admission. Repeat iron panel and ferritin labs this admission consistent with AoCD.     Hypothyroidism: levothyroxine 112 mcg daily  Sleeping: melatonin 6mg  nightly    Resolved     NAGMA (resolved)  Admission NAGMA with bicarb 17 resolved with oral bicarb to bicarb 21, pH 7.39.   The patient's hospital stay has been complicated by the following clinically significant conditions requiring additional evaluation and treatment or having a significant effect of this patient's care: - Thrombocytopenia POA requiring further investigation or monitor  - Anemia POA requiring further investigation or monitoring  - Chronic kidney disease POA requiring further investigation, treatment, or monitoring  - Hyperkalemia POA requiring further investigation, treatment, or monitoring  - Acidosis POA requiring further investigation, treatment, or monitoring         Touchbase with Outpatient Provider:  Warm Handoff: Completed on 12/30/22 by Franchot Gallo, MD  (Intern) via In-Basket Message    Procedures:  PICC line  ______________________________________________________________________  Discharge Medications:      Your Medication List        STOP taking these medications      aspirin 81 MG tablet  Commonly known as: ECOTRIN     melatonin 3 mg Tab     nitrofurantoin (macrocrystal-monohydrate) 100 MG capsule  Commonly known as: MACROBID            START taking these medications      ertapenem 1 g in sodium chloride 0.9 % 0.9 % 100 mL IVPB  Infuse 1 g into a venous catheter daily for 16 days.     sodium bicarbonate 650 mg tablet  Take 1 tablet (650  mg total) by mouth Three (3) times a day.            CHANGE how you take these medications      amlodipine 5 MG tablet  Commonly known as: NORVASC  Take 1 tablet (5 mg total) by mouth every twelve (12) hours.  What changed: Another medication with the same name was removed. Continue taking this medication, and follow the directions you see here.     insulin glargine 100 unit/mL (3 mL) injection pen  Commonly known as: BASAGLAR, LANTUS  Inject 0.03 mL (3 Units total) under the skin nightly.  What changed: how much to take            CONTINUE taking these medications      ACCU-CHEK AVIVA PLUS TEST STRP Strp  Generic drug: blood sugar diagnostic  USE 1 STRIP TO CHECK GLUCOSE THREE TIMES DAILY     blood sugar diagnostic Strp  USE 1 STRIP TO CHECK GLUCOSE THREE TIMES DAILY     ACCU-CHEK GUIDE TEST STRIPS Strp  Generic drug: blood sugar diagnostic  Use to check blood sugar as directed with insulin 3 times a day & for symptoms of high or low blood sugar.     ACCU-CHEK SOFTCLIX LANCETS lancets  Generic drug: lancets  Use to check blood sugar as directed with insulin 3 times a day & for symptoms of high or low blood sugar.     alendronate 70 MG tablet  Commonly known as: FOSAMAX  Take 1 tablet (70 mg total) by mouth every seven (7) days.     atorvastatin 10 MG tablet  Commonly known as: LIPITOR  Take 1 tablet (10 mg total) by mouth daily.     BD ULTRA-FINE NANO PEN NEEDLE 32 gauge x 5/32 (4 mm) Ndle  Generic drug: pen needle, diabetic  Use with insulin 2 times daily     BD ULTRA-FINE NANO PEN NEEDLE 32 gauge x 5/32 (4 mm) Ndle  Generic drug: pen needle, diabetic  Use with insulin up to 4 times/day as needed.     blood-glucose meter kit  Use as directed. ACCU-CHEK AVIVA     ACCU-CHEK GUIDE GLUCOSE METER Misc  Generic drug: blood-glucose meter  Use as instructed     carvedilol 25 MG tablet  Commonly known as: COREG  Take 1 tablet (25 mg total) by mouth two (2) times a day.     chlorthalidone 25 MG tablet  Commonly known as: HYGROTON  Take 1 tablet (25 mg total) by mouth daily.     cholecalciferol-25 mcg (1,000 unit) 1,000 unit (25 mcg) tablet  Generic drug: cholecalciferol (vitamin D3 25 mcg (1,000 units))  Take 2 tablets (50 mcg total) by mouth daily.     CRANBERRY ORAL  Take 2,000 mg by mouth two (2) times a day.     d-mannose Powd  Take 1 tablet by mouth daily.     DEXCOM G7 SENSOR Devi  Generic drug: blood-glucose sensor  Use 1 sensor every 10 days.     docusate sodium 100 MG capsule  Commonly known as: COLACE  Take 1 capsule (100 mg total) by mouth two (2) times a day as needed for constipation.     estradiol 0.01 % (0.1 mg/gram) vaginal cream  Commonly known as: ESTRACE  Insert 2 g into the vagina nightly.     hydrALAZINE 50 MG tablet  Commonly known as: APRESOLINE  Take 1 tablet (50 mg total) by mouth two (2) times a  day. levothyroxine 112 MCG tablet  Commonly known as: SYNTHROID  Take 1 tablet (112 mcg total) by mouth daily.     magnesium oxide-Mg AA chelate 133 mg  Commonly known as: Magnesium Plus Protein  Take 1 tablet by mouth two (2) times a day.     mycophenolate 250 mg capsule  Commonly known as: CELLCEPT  Take 2 capsules (500 mg total) by mouth two (2) times a day.     NOVOLOG FLEXPEN SUBQ  Inject 0-8 Units under the skin Three (3) times a day before meals. *Max daily dose: 33 units*     pantoprazole 40 MG tablet  Commonly known as: Protonix  Take 1 tablet (40 mg total) by mouth daily.     predniSONE 5 MG tablet  Commonly known as: DELTASONE  Take 1 tablet (5 mg total) by mouth in the morning.     SENNA LAXATIVE 8.6 mg tablet  Generic drug: senna  Take 1 tablet by mouth daily as needed for constipation.     spironolactone 25 MG tablet  Commonly known as: ALDACTONE  Take 1 tablet (25 mg total) by mouth daily.     sulfamethoxazole-trimethoprim 400-80 mg per tablet  Commonly known as: BACTRIM  Take 1 tablet (80 mg of trimethoprim total) by mouth Every Monday, Wednesday, and Friday.     tacrolimus 0.5 MG capsule  Commonly known as: PROGRAF  Take 2 capsules (1 mg total) by mouth daily AND 2 capsules (1 mg total) nightly.              Allergies:  Oxycodone, Gabapentin, and Valacyclovir  ______________________________________________________________________  Pending Test Results:  Pending Labs       Order Current Status    Tacrolimus Level, Trough Collected (12/30/22 1105)    Parvovirus PCR, Blood In process    Blood Culture, Adult Preliminary result    Blood Culture, Adult Preliminary result            Most Recent Labs:  All lab results last 24 hours -   Recent Results (from the past 24 hour(s))   Lactate dehydrogenase    Collection Time: 12/29/22 12:13 PM   Result Value Ref Range    LDH 212 120 - 246 U/L   Iron Panel    Collection Time: 12/29/22 12:13 PM   Result Value Ref Range    Iron 114 50 - 170 ug/dL    TIBC 161 (L) 096 - 045 ug/dL    Iron Saturation (%) 51 20 - 55 %   Ferritin    Collection Time: 12/29/22 12:13 PM   Result Value Ref Range    Ferritin 1,613.9 (H) 7.3 - 270.7 ng/mL   Type and Screen    Collection Time: 12/29/22 12:13 PM   Result Value Ref Range    ABO Grouping A POS     Antibody Screen NEG    Renal Function Panel    Collection Time: 12/29/22 12:13 PM   Result Value Ref Range    Sodium 141 135 - 145 mmol/L    Potassium 5.4 (H) 3.5 - 5.1 mmol/L    Chloride 110 (H) 98 - 107 mmol/L    CO2 26.0 20.0 - 31.0 mmol/L    Anion Gap 5 5 - 14 mmol/L    BUN 38 (H) 9 - 23 mg/dL    Creatinine 4.09 (H) 0.55 - 1.02 mg/dL    BUN/Creatinine Ratio 33     eGFR CKD-EPI (2021) Female 50 (L) >=60 mL/min/1.72m2  Glucose 230 (H) 70 - 179 mg/dL    Calcium 8.6 (L) 8.7 - 10.4 mg/dL    Phosphorus 2.7 2.4 - 5.1 mg/dL    Albumin 2.9 (L) 3.4 - 5.0 g/dL   Magnesium Level    Collection Time: 12/29/22 12:13 PM   Result Value Ref Range    Magnesium 1.7 1.6 - 2.6 mg/dL   CBC w/ Differential    Collection Time: 12/29/22 12:13 PM   Result Value Ref Range    WBC 3.4 (L) 3.6 - 11.2 10*9/L    RBC 2.43 (L) 3.95 - 5.13 10*12/L    HGB 8.2 (L) 11.3 - 14.9 g/dL    HCT 09.8 (L) 11.9 - 44.0 %    MCV 97.1 (H) 77.6 - 95.7 fL    MCH 33.6 (H) 25.9 - 32.4 pg    MCHC 34.6 32.0 - 36.0 g/dL    RDW 14.7 82.9 - 56.2 %    MPV 8.3 6.8 - 10.7 fL    Platelet 93 (L) 150 - 450 10*9/L    Neutrophils % 76.4 %    Lymphocytes % 14.4 %    Monocytes % 7.5 %    Eosinophils % 1.3 %    Basophils % 0.4 %    Absolute Neutrophils 2.6 1.8 - 7.8 10*9/L    Absolute Lymphocytes 0.5 (L) 1.1 - 3.6 10*9/L    Absolute Monocytes 0.3 0.3 - 0.8 10*9/L    Absolute Eosinophils 0.0 0.0 - 0.5 10*9/L    Absolute Basophils 0.0 0.0 - 0.1 10*9/L   POCT Glucose    Collection Time: 12/29/22  5:18 PM   Result Value Ref Range    Glucose, POC 319 (H) 70 - 179 mg/dL   Basic Metabolic Panel    Collection Time: 12/29/22  9:13 PM   Result Value Ref Range    Sodium 138 135 - 145 mmol/L    Potassium 5.5 (H) 3.4 - 4.8 mmol/L    Chloride 109 (H) 98 - 107 mmol/L    CO2 25.0 20.0 - 31.0 mmol/L    Anion Gap 4 (L) 5 - 14 mmol/L    BUN 42 (H) 9 - 23 mg/dL    Creatinine 1.30 (H) 0.55 - 1.02 mg/dL    BUN/Creatinine Ratio 34     eGFR CKD-EPI (2021) Female 47 (L) >=60 mL/min/1.28m2    Glucose 188 (H) 70 - 179 mg/dL    Calcium 8.9 8.7 - 86.5 mg/dL   CBC    Collection Time: 12/29/22  9:13 PM   Result Value Ref Range    WBC 2.9 (L) 3.6 - 11.2 10*9/L    RBC 2.37 (L) 3.95 - 5.13 10*12/L    HGB 7.7 (L) 11.3 - 14.9 g/dL    HCT 78.4 (L) 69.6 - 44.0 %    MCV 96.8 (H) 77.6 - 95.7 fL    MCH 32.5 (H) 25.9 - 32.4 pg    MCHC 33.5 32.0 - 36.0 g/dL    RDW 29.5 28.4 - 13.2 %    MPV 8.6 6.8 - 10.7 fL    Platelet 103 (L) 150 - 450 10*9/L   POCT Glucose    Collection Time: 12/29/22  9:18 PM   Result Value Ref Range    Glucose, POC 192 (H) 70 - 179 mg/dL   Renal Function Panel    Collection Time: 12/30/22  6:19 AM   Result Value Ref Range    Sodium 141 135 - 145 mmol/L    Potassium 4.6  3.4 - 4.8 mmol/L    Chloride 110 (H) 98 - 107 mmol/L    CO2 26.0 20.0 - 31.0 mmol/L    Anion Gap 5 5 - 14 mmol/L    BUN 39 (H) 9 - 23 mg/dL    Creatinine 4.09 (H) 0.55 - 1.02 mg/dL    BUN/Creatinine Ratio 33     eGFR CKD-EPI (2021) Female 48 (L) >=60 mL/min/1.60m2    Glucose 178 70 - 179 mg/dL    Calcium 9.7 8.7 - 81.1 mg/dL    Phosphorus 2.9 2.4 - 5.1 mg/dL    Albumin 3.2 (L) 3.4 - 5.0 g/dL   Magnesium Level    Collection Time: 12/30/22  6:19 AM   Result Value Ref Range    Magnesium 1.7 1.6 - 2.6 mg/dL   CBC w/ Differential    Collection Time: 12/30/22  6:19 AM   Result Value Ref Range    WBC 3.1 (L) 3.6 - 11.2 10*9/L    RBC 2.49 (L) 3.95 - 5.13 10*12/L    HGB 8.2 (L) 11.3 - 14.9 g/dL    HCT 91.4 (L) 78.2 - 44.0 %    MCV 96.7 (H) 77.6 - 95.7 fL    MCH 33.0 (H) 25.9 - 32.4 pg    MCHC 34.2 32.0 - 36.0 g/dL    RDW 95.6 21.3 - 08.6 %    MPV 8.2 6.8 - 10.7 fL    Platelet 105 (L) 150 - 450 10*9/L    Neutrophils % 66.7 %    Lymphocytes % 23.5 %    Monocytes % 7.5 %    Eosinophils % 1.8 %    Basophils % 0.5 %    Absolute Neutrophils 2.0 1.8 - 7.8 10*9/L    Absolute Lymphocytes 0.7 (L) 1.1 - 3.6 10*9/L    Absolute Monocytes 0.2 (L) 0.3 - 0.8 10*9/L    Absolute Eosinophils 0.1 0.0 - 0.5 10*9/L    Absolute Basophils 0.0 0.0 - 0.1 10*9/L   POCT Glucose    Collection Time: 12/30/22  7:12 AM   Result Value Ref Range    Glucose, POC 171 70 - 179 mg/dL       Relevant Studies/Radiology:  IR Insert Tunneled Catheter (Age Greater Than 5 Years)    Result Date: 12/29/2022  PROCEDURE: Tunneled central venous catheter placement ACCESSION: [id number] UN Procedural Personnel Attending physician(s):  Rosalio Loud Resident physician(s): Dr. Jake Bathe Advanced practice provider(s): None Procedure Date (mm/dd/yyyy): 12/29/2022 2:54 PM Pre-procedure diagnosis: Pyelonephritis Post-procedure diagnosis: Same Indication: IV antibiotics Additional clinical history: None _______________________________________________________________ PROCEDURE SUMMARY: - Venous access with ultrasound guidance - Tunneled central venous catheter insertion with fluoroscopic guidance - Additional procedure(s): None PROCEDURE DETAILS: Pre-procedure Consent: Informed consent for the procedure including risks, benefits and alternatives was obtained and time-out was performed prior to the procedure. Preparation: The site was prepared and draped using maximal sterile barrier technique including cutaneous antisepsis. Anesthesia/sedation Level of anesthesia/sedation: Moderate sedation (conscious sedation) Anesthesia/sedation administered by: Independent trained observer under attending supervision with continuous monitoring of the patient's level of consciousness and physiologic status Total intra-service sedation time (minutes): I personally spent 32 minutes, continuously monitoring the patient face-to-face during the administration of moderate sedation. Radiology nurse was present for the duration of the procedure to assist in patient monitoring. Pre and Post Sedation activities have been reviewed. Access Local anesthesia was administered. The vessel was sonographically evaluated and determined to be patent. Real time ultrasound was used to visualize needle entry into the vessel and a  permanent image was stored. The right external jugular and internal jugular veins were severely stenotic proximally. Laterality: Right Vein accessed: Subclavian vein Access technique: Micropuncture set with 21 gauge needle Venography Indication for venography: Not performed Vein catheterized: Not applicable Findings: Not applicable Catheter placement An incision was made near the venous access site and the catheter was tunneled subcutaneously to the venous access site and trimmed to appropriate length. The catheter was advanced via a peel-away sheath into the vein under fluoroscopic guidance. Catheter tip location was fluoroscopically verified and a permanent image was stored. Catheter placed: Single-lumen power line Catheter size Janice Norrie): 5 Lumens: Single Power injectable: Yes Catheter tip: cavoatrial junction Catheter flush: Heparin (100 units/mL) Closure A sterile dressing was applied. Access site closure technique: Tissue adhesive Catheter securement technique: Non-absorbable suture Contrast Contrast agent: Omnipaque 300 Contrast volume (mL): 0 Radiation Dose Fluoroscopy time (minutes): 3.2 Reference Air Kerma (mGy): 9 Additional Details Additional description of procedure: None Device used: None Equipment details: None Unique Device Identifiers: Not available Specimens removed: None Estimated blood loss (mL): Less than 10 Standardized report: [email or web address] Attestation Signer name: Rosalio Loud I attest that I was present for the entire procedure. I reviewed the stored images and agree with the report as written. _______________________________________________________________ Complications: No immediate complications.     Insertion of right-sided tunneled single-lumen power line , with tip in the expected location of the cavoatrial junction. 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 Plan: The catheter may be used immediately.     ECG 12 Lead    Result Date: 12/27/2022  NORMAL SINUS RHYTHM ST & T WAVE ABNORMALITY, CONSIDER LATERAL ISCHEMIA ABNORMAL ECG WHEN COMPARED WITH ECG OF 04-Nov-2022 20:45, ST MORE DEPRESSED IN LATERAL LEADS NONSPECIFIC T WAVE ABNORMALITY, IMPROVED IN INFERIOR LEADS Confirmed by Warnell Forester ([id number]) on 12/27/2022 3:51:00 PM    US Renal Transplant W Doppler    Result Date: 12/25/2022  EXAM: US RENAL TRANSPLANT [place] ACCESSION: [id number] UN CLINICAL INDICATION: 72 years old with fever in renal transplant COMPARISON: Renal transplant ultrasound 11/05/2022, 10/05/2022 TECHNIQUE:  Ultrasound views of the renal transplant were obtained using gray scale and color and spectral Doppler imaging. Views of the urinary bladder were obtained using gray scale and limited color Doppler imaging. FINDINGS: TRANSPLANTED KIDNEY: The renal transplant was located in the left lower quadrant. Normal size and echogenicity. Parapelvic cyst or dilated calyx within the upper pole measuring approximately 1.0 x 0.8 x 0.9 cm, unchanged. No solid masses or calculi. No perinephric collections identified. No hydronephrosis. VESSELS: - Perfusion: Using power Doppler, normal perfusion was seen throughout the renal parenchyma. - Resistive indices in the renal transplant are increased compared with prior examination, particularly within the main renal artery. Slightly tardus parvus appearance in the segmental arteries, similar to prior. - Main renal artery/iliac artery: Patent - Main renal vein/iliac vein: Patent BLADDER: Mildly distended, unremarkable.     *  Slightly increased elevated resistive indices within the main renal artery. *  Stable mildly elevated resistive indices within the segmental arteries. *  Elevated velocities at the MRA anastomosis, similar to prior. Please see below for data measurements: Transplant location:   LLQ Renal Transplant: Sagittal   12.86  cm; AP 6.13 cm; Transverse 6.79 cm Segmental artery superior resistive index:  0.85  Segmental artery mid resistive index:  0.86  Segmental artery inferior resistive index:  0.85  Previous resistive indices range of segmental arteries:   (0.81-0.84) Main renal artery peak systolic velocity at  anastomosis:   3.58  m/s, previously 3.2 m/s Main renal artery hilum resistive index:   0.90 Main renal artery mid resistive index:  0.93  Main renal artery anastomosis resistive index:   0.94 Previous resistive indices range of main renal artery:  (0.80-0.90)  Main renal vein: patent Iliac artery: Patent Iliac vein: Patent Bladder volume prevoid:   90.1   mL     XR Chest Portable    Result Date: 12/25/2022  EXAM: XR CHEST PORTABLE ACCESSION: [id number] UN CLINICAL INDICATION: Fever. TECHNIQUE: Single View AP Chest Radiograph. COMPARISON: April 16. FINDINGS: Clear lungs with no pleural fluid or pneumothorax. Stable cardiomediastinal contour with calcified aorta. Vascular stent on the left.    No acute abnormalities.   ______________________________________________________________________  Discharge Instructions:   Activity Instructions       Activity as tolerated            You were admitted to [facility] with fever. Given you are immunosuppressed we conducted a comprehensive infectious workup. Our workup came back negative with the exception of mild bacteria in your urine, which is below the threshold for what we usually see in UTI. Given your fever, we suspect you have an infection in your kidney and are providing you with an antibiotic course to complete at home for 21 days.     You are now ready to discharge and will be discharging to: Home     Please take your medications as prescribed. Medication changes are listed below and a full list of medications is in this discharge packet. Please keep your follow-up appointments after the hospital for ongoing care. It has been a pleasure taking care of you, we wish you the best.      MEDICATION CHANGES:  -- START ertapenem 1g IV daily with home health until June 25th, 2024  -- START sodium bicarbonate 650mg  3 times daily  -- STOP taking nitrofurantoin 100mg  nightly  -- CHANGE lantus to 3 units from 6 units nightly  -- CHANGE amlodipine 5mg  daily every 12 hours  -- DO NOT TAKE aspirin until you see your doctor outpatient.      FOLLOW-UP:  -- It is important to see your primary care provider (PCP) after being in the hospital. We do not have a PCP noted in your chart. Please call them to set up an appointment in the next week.  -- The rest of your appointments are as below:        Follow Up instructions and Outpatient Referrals     Referral to Home Infusion      Performing location?: External    Home Health Requested Disciplines: Nursing     **Please contact your service pharmacist for assistance with discharge   home health infusion monitoring.      Call MD for:  difficulty breathing, headache or visual disturbances      Call MD for:  persistent nausea or vomiting      Call MD for:  severe uncontrolled pain      Call MD for:  temperature >38.5 Celsius      Discharge instructions          Appointments which have been scheduled for you      Jan 04, 2023 11:00 AM  (Arrive by 10:50 AM)  RETURN HCP TELEPHONE with Eliezer Bottom, LCSW  [facility]ANS[facility] [place] [facility]) [address]  [place] [address]  [phone]        Jan 18, 2023 11:00 AM  (Arrive by 10:30 AM)  FOLLOW UP FOR A GENERAL INFECTIOUS DISEASE VISIT with Bary Richard, MD  [facility]FECTI[facility] DISEASES [place] [facility]) [address]  [place] [address]  [phone]        Mar 09, 2023 10:00 AM  (Arrive by 9:45 AM)  RETURN NEPHROLOGY POST with Leafy Half, MD  [facility]ANS[facility] [place] [facility]) [address]  [facility] 1 through [address]  [place] [address]  [phone]             ______________________________________________________________________  Discharge Day Services:  BP 187/53  - Pulse 69  - Temp 36.6 ??C (97.9 ??F) (Oral)  - Resp 20  - Ht 152.4 cm (5')  - Wt 55.3 kg (121 lb 14.6 oz)  - SpO2 97%  - BMI 23.81 kg/m??     Pt seen on the day of discharge and determined appropriate for discharge.    Condition at Discharge: good    Length of Discharge: I spent greater than 30 mins in the discharge of this patient.

## 2022-12-29 NOTE — Unmapped (Signed)
A&Ox4, VSS on RA. BG monitored and coverage given. Telemetry and continuous pulse ox on. Denies pain. IV abx infused. Npo since MN for power line placement this AM. Free from falls and injury. Bed low and locked, call bell and side table within reach.    Problem: Adult Inpatient Plan of Care  Goal: Plan of Care Review  Outcome: Progressing  Goal: Patient-Specific Goal (Individualized)  Outcome: Progressing  Goal: Absence of Hospital-Acquired Illness or Injury  Outcome: Progressing  Intervention: Identify and Manage Fall Risk  Recent Flowsheet Documentation  Taken 12/28/2022 2100 by Chauncy Passy, RN  Safety Interventions:   fall reduction program maintained   family at bedside   low bed  Intervention: Prevent Skin Injury  Recent Flowsheet Documentation  Taken 12/28/2022 2100 by Chauncy Passy, RN  Positioning for Skin: Supine/Back  Device Skin Pressure Protection: absorbent pad utilized/changed  Skin Protection: adhesive use limited  Intervention: Prevent and Manage VTE (Venous Thromboembolism) Risk  Recent Flowsheet Documentation  Taken 12/28/2022 2100 by Chauncy Passy, RN  VTE Prevention/Management:   anticoagulant therapy   ambulation promoted  Goal: Optimal Comfort and Wellbeing  Outcome: Progressing  Goal: Readiness for Transition of Care  Outcome: Progressing  Goal: Rounds/Family Conference  Outcome: Progressing     Problem: Self-Care Deficit  Goal: Improved Ability to Complete Activities of Daily Living  Outcome: Progressing     Problem: Fall Injury Risk  Goal: Absence of Fall and Fall-Related Injury  Outcome: Progressing  Intervention: Promote Injury-Free Environment  Recent Flowsheet Documentation  Taken 12/28/2022 2100 by Chauncy Passy, RN  Safety Interventions:   fall reduction program maintained   family at bedside   low bed     Problem: Wound  Goal: Optimal Coping  Outcome: Progressing  Goal: Optimal Functional Ability  Outcome: Progressing  Intervention: Optimize Functional Ability  Recent Flowsheet Documentation  Taken 12/28/2022 2100 by Chauncy Passy, RN  Activity Management: ambulated in room  Goal: Absence of Infection Signs and Symptoms  Outcome: Progressing  Intervention: Prevent or Manage Infection  Recent Flowsheet Documentation  Taken 12/28/2022 2100 by Chauncy Passy, RN  Infection Management: aseptic technique maintained  Goal: Improved Oral Intake  Outcome: Progressing  Goal: Optimal Pain Control and Function  Outcome: Progressing  Goal: Skin Health and Integrity  Outcome: Progressing  Intervention: Optimize Skin Protection  Recent Flowsheet Documentation  Taken 12/28/2022 2100 by Chauncy Passy, RN  Activity Management: ambulated in room  Pressure Reduction Techniques: frequent weight shift encouraged  Head of Bed (HOB) Positioning: HOB at 30-45 degrees  Pressure Reduction Devices: positioning supports utilized  Skin Protection: adhesive use limited  Goal: Optimal Wound Healing  Outcome: Progressing     Problem: Chronic Kidney Disease  Goal: Optimal Coping with Chronic Illness  Outcome: Progressing  Goal: Electrolyte Balance  Outcome: Progressing  Goal: Fluid Balance  Outcome: Progressing  Intervention: Monitor and Manage Hypervolemia  Recent Flowsheet Documentation  Taken 12/28/2022 2100 by Chauncy Passy, RN  Skin Protection: adhesive use limited  Goal: Optimal Functional Ability  Outcome: Progressing  Intervention: Optimize Functional Ability  Recent Flowsheet Documentation  Taken 12/28/2022 2100 by Chauncy Passy, RN  Activity Management: ambulated in room  Goal: Absence of Anemia Signs and Symptoms  Outcome: Progressing  Intervention: Manage Signs of Anemia and Bleeding  Recent Flowsheet Documentation  Taken 12/28/2022 2100 by Chauncy Passy, RN  Bleeding Precautions: blood pressure closely monitored  Goal: Optimal Oral Intake  Outcome: Progressing  Goal:  Acceptable Pain Control  Outcome: Progressing  Goal: Minimize Renal Failure Effects  Outcome: Progressing     Problem: Bleeding Risk or Actual (Thrombocytopenia)  Goal: Absence of Bleeding  Outcome: Progressing  Intervention: Minimize Bleeding Risk  Recent Flowsheet Documentation  Taken 12/28/2022 2100 by Chauncy Passy, RN  Bleeding Precautions: blood pressure closely monitored     Problem: Comorbidity Management  Goal: Blood Glucose Levels Within Targeted Range  Outcome: Progressing  Intervention: Monitor and Manage Glycemia  Recent Flowsheet Documentation  Taken 12/28/2022 2100 by Chauncy Passy, RN  Glycemic Management: blood glucose monitored  Goal: Blood Pressure in Desired Range  Outcome: Progressing

## 2022-12-29 NOTE — Unmapped (Signed)
IMMUNOCOMPROMISED HOST INFECTIOUS DISEASE PROGRESS NOTE    Assessment/Plan:     Julie [name] is a 72 y.o. 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 Had perinephric drain in place postoperatively, removed 08/30/22  - Immunosuppression: Tacrolimus (goal 6-8), MMF 500 BID, Pred 5  - Prophylaxis: valganciclovir x 3 months (mod risk, completed), TMP+SMX x 6 months  Estimated Creatinine Clearance: 33.7 mL/min (A) (based on SCr of 1.16 mg/dL (H)).      - Right subclavian vein tunneled line 12/29/22     Pertinent comorbidities:  ESRD was on PD and HD  DM II (A1c 5.8 on 12/07/22)  CAD  HLD  SBO 10/07/22 s/p ex lap 10/13/22 w/ lysis of adhesions     Pertinent exposures:  Originally from [place]   Treated with ivermectin at the ID clinic in 2018  Increased amount of gardening and outdoor exposures over past several months     Summary of pertinent prior infections:  #History of Shingles 2017  #04/2018 Dialysis fluid [place] R: Ampicillin, Unasyn, Cefazolin,S: Ceftriaxone, Gentamicin, Levo, PipTazo, Tobra  #Thrush w/presumed candidal esophagitis 10/10/22 s/p 14 day treatment fluconazole  #Donor urine cx with C. Dubliensis 08/05/22, <10K, negative recipient samples, negative donor blood cx, treated with 2 weeks PO fluconazole  # Recurrent MDR E.Coli Bacteremia/UTI  - 1/29 BCx/UCx E. Coli - s/p Levo x 3 weeks (thru stent removal, 3/15) (S: Cefazolin, Cipro/Levo, Pip-tazo, Cephalexin; R: TMP-SMX, Amp, Amp-sulb)  - 3/14 BCx/UCx ESBL/FQ-R E. Coli s/p 14 days Meropenem; also with perinephric fluid collection; (S: Erta, Gent, Mero, Tobra, Cefepime SDD; I: ceftaz; R: Cipro/Levo, Cefazolin, Ceftriaxone, Amp)  - 4/13 Bcx/Ucx E. Coli (R CFZ, CTX, cipro/levo, I ceftaz, SDD CFP, susc nitro). Treated vanc/mero -> mero x 2 weeks  - 4/29 started on nitrofurantoin prophylaxis  - 5/7 seen by urogyn, no structural issues planned to stop nitrofurantoin with switch to methenamine (not done), added vaginal estrogen/d-mannose.     #Perinephric fluid collection 10/05/22  - 3/19 IR guided drainage negative cx, Cr 1.5 not suggestive of urinoma  - 4/13 increased stranding, 4/16 not amenable to intervention per VIR     Active infection:  #Concern for recurrent MDR E. Coli UTI, despite nitrofurantoin ppx, transplant pyelonephritis 12/25/22  - 6/3 presented to [name] with fever, n/v. No urinary symptoms, no cough or URI symptoms  - 6/3 UA (cath) trace leuks, few bacteria, no UCx; BCx (NGTD); RSV/Flu/Covid neg  - 6/3 OSH CT AP: mild transplant perinephric edema, no hydronephrosis, no fluid collections  - 6/3 [name] BCx (NGTD), CMV/EBV/BK PCR neg; Crypto Ag neg UCx <10,000 E. Coli (S: Cefepime SDD; Erta/Mero, Gent, Nitrofurantoin, Tobra)  - 6/3 US renal txp: No solid masses or calculi. No perinephric collections identified. 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  - 06/06/22 hepB surface Ab positive, surface Ag neg  - 08/11/22 HBV DNA not detected  - 09/06/22 HBV DNA not detected  - 6/6 HBV DNA not detected and HBsAg pending     Antimicrobial allergy/intolerance:   Penicillin (see allergy list for nuances)  Valacyclovir - nausea         RECOMMENDATIONS    Diagnosis  F/u 6/3 [place] BCx (NG 4 days as of 12/29/22 per microlab)  F/u 6/3 BCx (NGTD)  F/u 6/6 serum adenovirus, HBsAg    Management  Continue IV meropenem, renal dosed equivalent of 1g q8h (renal dose is 500mg  q12h)  Okay to use Ertapenem on discharge for easy of administration   Duration: 21 days (6/4-6/24)    Antimicrobial prophylaxis required for transplant immunosuppression   Viral: Completed valcyte x 3 months  PJP: Continue TMP+SMX renally dosed equivalent 400/80 MWF x 6 months  UTI ppx: HOLD nitrofurantoin while on treatment above    Intensive toxicity monitoring for prescription antimicrobials   CBC w/diff at least once per week  CMP at least once per week  clinical assessments for rashes or other skin changes  Given hepB core+, needs LFTs q71m for first year, HBSAg and HBV DNA q45m for first year and with any rise in LFTs     Solid Organ Transplant Infectious Diseases Follow-up Instructions  Appointment: 01/18/23 at 11:00am  Location: [facility]th [facility], [address] [place], [place]  Labs: weekly CBC with differential, CMP  Please fax labs to patient???s transplant coordinator: Celene Squibb at  [phone] (Kidney/Pancreas) and to [employer] (ICHID pharmD at [phone])   Antibiotics:   Ertapenem IV 1g daily     Antibiotic End Date: 01/15/23  Nitrofurantoin - Restart on 01/16/23    The [facility] ID APP service will sign off and arrange outpatient ID follow up.           Please page Darolyn Rua, NP at [phone] from 8-4:30pm, after 4:30 pm & on weekends, please page the ID Transplant/Liquid Oncology Fellow consult at [phone] with questions.    Patient discussed with Dr.  Para March .      Darolyn Rua, MSN, APRN, AGNP-C  Immunocompromised Infectious Disease Nurse Practitioner    I personally spent 15 minutes face-to-face and non-face-to-face in the care of this patient, which includes all pre, intra, and post visit time on the date of service.  All documented time was specific to the E/M visit and does not include any procedures that may have been performed.    Subjective:     External record(s): Procedure/op note(s) VIR note reviewed and noted no complications during placement of powerline .    Independent historian(s): no independent historian required.       Interval History:   Afebrile and NAEON. Patient to get tunneled line today for outpatient home infusion. Patient asking when she can go home. She denies dysuria.     Medications:  Current Medications as of 12/29/2022  Scheduled  PRN   amlodipine, 5 mg, Q12H  [Provider Hold] aspirin, 81 mg, Daily  atorvastatin, 10 mg, Nightly  carvedilol, 25 mg, BID  ertapenem, 500 mg, Q24H  heparin (porcine) for subcutaneous use, 5,000 Units, Q8H SCH  hydrALAZINE, 50 mg, BID  insulin lispro, 0-20 Units, ACHS  levothyroxine, 112 mcg, Daily  magnesium oxide-Mg AA chelate, 1 tablet, BID  melatonin, 6 mg, QPM  pantoprazole, 40 mg, Daily  polyethylene glycol, 17 g, Daily  predniSONE, 5 mg, Daily  sodium bicarbonate, 650 mg, TID  spironolactone, 25 mg, Daily  sulfamethoxazole-trimethoprim, 1 tablet, Q MWF  tacrolimus, 1 mg, Daily   And  tacrolimus, 1 mg, Nightly      acetaminophen, 650 mg, Q6H PRN  dextrose in water, 12.5 g, Q10 Min PRN  glucagon, 1  mg, Once PRN  glucose, 16 g, Q10 Min PRN  ondansetron, 4 mg, Q8H PRN   Or  ondansetron, 4 mg, Q8H PRN  senna, 1 tablet, Daily PRN         Objective:     Vital Signs last 24 hours:  Temp:  [36.7 ??C (98.1 ??F)] 36.7 ??C (98.1 ??F)  Heart Rate:  [66-78] 78  Resp:  [13-21] 20  BP: (141-194)/(46-54) 141/52  MAP (mmHg):  [82-84] 82  SpO2:  [90 %-100 %] 98 %    Physical Exam:   Patient Lines/Drains/Airways Status       Active Active Lines, Drains, & Airways       Name Placement date Placement time Site Days    CVC Single Lumen 12/29/22 Tunneled Right Subclavian 12/29/22  0911  Subclavian  less than 1    Peripheral IV 12/29/22 Posterior;Right Wrist 12/29/22  0630  Wrist  less than 1    Arteriovenous Fistula - Vein Graft  Access 08/24/16 Left;Upper Arm 08/24/16  --  Arm  2318                  Const [x]  vital signs above    []  NAD, non-toxic appearance []  Chronically ill-appearing, non-distressed  Sitting in bed eating breakfast; husband at bedside      Eyes [x]  Lids normal bilaterally, conjunctiva anicteric and noninjected OU     [] PERRL  [] EOMI        ENMT [x]  Normal appearance of external nose and ears, no nasal discharge        [x]  MMM, no lesions on lips or gums [x]  No thrush, leukoplakia, oral lesions  []  Dentition good []  Edentulous []  Dental caries present  []  Hearing normal  []  TMs with good light reflexes bilaterally         Neck [x]  Neck of normal appearance and trachea midline        []  No thyromegaly, nodules, or tenderness   []  Full neck ROM        Lymph []  No LAD in neck     []  No LAD in supraclavicular area     []  No LAD in axillae   []  No LAD in epitrochlear chains     []  No LAD in inguinal areas        CV []  RRR            []  No peripheral edema     []  Pedal pulses intact   []  No abnormal heart sounds appreciated   [x]  Extremities WWP   Murmur present      Resp [x]  Normal WOB at rest    []  No breathlessness with speaking, no coughing  []  CTA anteriorly    []  CTA posteriorly          GI [x]  Normal inspection, NTND   []  NABS     []  No umbilical hernia on exam       []  No hepatosplenomegaly     []  Inspection of perineal and perianal areas normal  No pain with palpation over graft site      GU []  Normal external genitalia     [] No urinary catheter present in urethra   []  No CVA tenderness    []  No tenderness over renal allograft        MSK []  No clubbing or cyanosis of hands       []  No vertebral point tenderness  []  No focal tenderness or abnormalities on palpation  of joints in RUE, LUE, RLE, or LLE        Skin [x]  No rashes, lesions, or ulcers of visualized skin     []  Skin warm and dry to palpation   Right chest powerline with bloody dressing      Neuro [x]  Face expression symmetric  []  Sensation to light touch grossly intact throughout    []  Moves extremities equally    []  No tremor noted        []  CNs II-XII grossly intact     []  DTRs normal and symmetric throughout []  Gait unremarkable        Psych [x]  Appropriate affect       []  Fluent speech         [x]  Attentive, good eye contact  [x]  Oriented to person, place, time          []  Judgment and insight are appropriate           Data for Medical Decision Making     12/25/22 EKG QTcF    I discussed mgm't w/qualified health care professional(s) involved in case: discussed with primary team patient needs to be discharged on 1g of Ertapenem and not 500mg  .    I reviewed CBC results (Platelets improved ), chemistry results (worsening hyperkalemia but improved creatinine), and micro result(s) (blood cultures no growth).    I independently visualized/interpreted not done.       Recent Labs     Units 12/29/22  1213   WBC 10*9/L 3.4*   HGB g/dL 8.2*   PLT 16*1/W 93*   NEUTROABS 10*9/L 2.6   LYMPHSABS 10*9/L 0.5*   EOSABS 10*9/L 0.0   BUN mg/dL 38*   CREATININE mg/dL 9.60*   K mmol/L 5.4*   MG mg/dL 1.7   PHOS mg/dL 2.7   CALCIUM mg/dL 8.6*     Lab Results   Component Value Date    CMVLR Not Detected 12/25/2022    TACROLIMUS 7.7 12/27/2022         Microbiology:  Microbiology Results (last day)       Procedure Component Value Date/Time Date/Time    Blood Culture, Adult [[id number]]  (Normal) Collected: 12/25/22 2110    Lab Status: Preliminary result Specimen: Blood from 1 Peripheral Draw Updated: 12/28/22 2145     Blood Culture, Routine No Growth at 72 hours    Blood Culture, Adult [[id number]]  (Normal) Collected: 12/25/22 2110    Lab Status: Preliminary result Specimen: Blood from 1 Peripheral Draw Updated: 12/28/22 2145     Blood Culture, Routine No Growth at 72 hours                  Imaging:  No new imaging

## 2022-12-29 NOTE — Unmapped (Signed)
[place] INTERVENTIONAL RADIOLOGY - Operative Note     VIR Post-Procedure Note    Procedure Name: SL Powerline    Pre-Op Diagnosis: pyelonephritis    Post-Op Diagnosis: Same as pre-operative diagnosis    VIR Providers    Attending Physician: Dr. Rosalio Loud    Fellow/Resident: Jake Bathe, MD    Time out: Prior to the procedure, a time out was performed with all team members present. During the time out, the patient, procedure and procedure site when applicable were verbally verified by the team members and Jake Bathe, MD.    Description of procedure: Successful insertion of right tunneled SL powerline via the right subclavian vein.     Sedation:Moderate sedation    Estimated Blood Loss: approximately 5 mL  Complications: None    See detailed procedure note with images in PACS [facility]).    The patient tolerated the procedure well without incident or complication and left the room in stable condition.    Jake Bathe, MD  12/29/2022 9:20 AM

## 2022-12-30 DIAGNOSIS — A041 Enterotoxigenic Escherichia coli infection: Secondary | ICD-10-CM | POA: Diagnosis not present

## 2022-12-30 LAB — CBC W/ AUTO DIFF
BASOPHILS ABSOLUTE COUNT: 0 10*9/L (ref 0.0–0.1)
BASOPHILS RELATIVE PERCENT: 0.5 %
EOSINOPHILS ABSOLUTE COUNT: 0.1 10*9/L (ref 0.0–0.5)
EOSINOPHILS RELATIVE PERCENT: 1.8 %
HEMATOCRIT: 24 % — ABNORMAL LOW (ref 34.0–44.0)
HEMOGLOBIN: 8.2 g/dL — ABNORMAL LOW (ref 11.3–14.9)
LYMPHOCYTES ABSOLUTE COUNT: 0.7 10*9/L — ABNORMAL LOW (ref 1.1–3.6)
LYMPHOCYTES RELATIVE PERCENT: 23.5 %
MEAN CORPUSCULAR HEMOGLOBIN CONC: 34.2 g/dL (ref 32.0–36.0)
MEAN CORPUSCULAR HEMOGLOBIN: 33 pg — ABNORMAL HIGH (ref 25.9–32.4)
MEAN CORPUSCULAR VOLUME: 96.7 fL — ABNORMAL HIGH (ref 77.6–95.7)
MEAN PLATELET VOLUME: 8.2 fL (ref 6.8–10.7)
MONOCYTES ABSOLUTE COUNT: 0.2 10*9/L — ABNORMAL LOW (ref 0.3–0.8)
MONOCYTES RELATIVE PERCENT: 7.5 %
NEUTROPHILS ABSOLUTE COUNT: 2 10*9/L (ref 1.8–7.8)
NEUTROPHILS RELATIVE PERCENT: 66.7 %
PLATELET COUNT: 105 10*9/L — ABNORMAL LOW (ref 150–450)
RED BLOOD CELL COUNT: 2.49 10*12/L — ABNORMAL LOW (ref 3.95–5.13)
RED CELL DISTRIBUTION WIDTH: 13.6 % (ref 12.2–15.2)
WBC ADJUSTED: 3.1 10*9/L — ABNORMAL LOW (ref 3.6–11.2)

## 2022-12-30 LAB — RENAL FUNCTION PANEL
ALBUMIN: 3.2 g/dL — ABNORMAL LOW (ref 3.4–5.0)
ANION GAP: 5 mmol/L (ref 5–14)
BLOOD UREA NITROGEN: 39 mg/dL — ABNORMAL HIGH (ref 9–23)
BUN / CREAT RATIO: 33
CALCIUM: 9.7 mg/dL (ref 8.7–10.4)
CHLORIDE: 110 mmol/L — ABNORMAL HIGH (ref 98–107)
CO2: 26 mmol/L (ref 20.0–31.0)
CREATININE: 1.2 mg/dL — ABNORMAL HIGH
EGFR CKD-EPI (2021) FEMALE: 48 mL/min/{1.73_m2} — ABNORMAL LOW (ref >=60)
GLUCOSE RANDOM: 178 mg/dL (ref 70–179)
PHOSPHORUS: 2.9 mg/dL (ref 2.4–5.1)
POTASSIUM: 4.6 mmol/L (ref 3.4–4.8)
SODIUM: 141 mmol/L (ref 135–145)

## 2022-12-30 LAB — PARVOVIRUS PCR, BLOOD: PARVO PCR, BLD: NEGATIVE

## 2022-12-30 LAB — MAGNESIUM: MAGNESIUM: 1.7 mg/dL (ref 1.6–2.6)

## 2022-12-30 LAB — CULTURE, BLOOD (ROUTINE X 2)
Culture: NO GROWTH
Special Requests: ADEQUATE

## 2022-12-30 MED ORDER — SODIUM BICARBONATE 650 MG TABLET
ORAL_TABLET | Freq: Three times a day (TID) | ORAL | 0 refills | 30 days | Status: CP
Start: 2022-12-30 — End: 2023-01-29

## 2022-12-30 MED ORDER — AMLODIPINE 5 MG TABLET
ORAL_TABLET | Freq: Two times a day (BID) | ORAL | 3 refills | 45 days | Status: CP
Start: 2022-12-30 — End: 2023-12-30

## 2022-12-30 MED ORDER — INSULIN GLARGINE (U-100) 100 UNIT/ML (3 ML) SUBCUTANEOUS PEN
Freq: Every evening | SUBCUTANEOUS | 0 refills | 30 days | Status: CP
Start: 2022-12-30 — End: 2023-01-29

## 2022-12-30 MED ADMIN — carvedilol (COREG) tablet 25 mg: 25 mg | ORAL | @ 13:00:00 | Stop: 2022-12-30

## 2022-12-30 MED ADMIN — polyethylene glycol (MIRALAX) packet 17 g: 17 g | ORAL | @ 13:00:00 | Stop: 2022-12-30

## 2022-12-30 MED ADMIN — tacrolimus (PROGRAF) capsule 1 mg: 1 mg | ORAL | @ 13:00:00 | Stop: 2022-12-30

## 2022-12-30 MED ADMIN — atorvastatin (LIPITOR) tablet 10 mg: 10 mg | ORAL | @ 01:00:00

## 2022-12-30 MED ADMIN — sodium zirconium cyclosilicate (LOKELMA) packet 10 g: 10 g | ORAL | @ 10:00:00 | Stop: 2022-12-30

## 2022-12-30 MED ADMIN — hydrALAZINE (APRESOLINE) tablet 50 mg: 50 mg | ORAL | @ 01:00:00

## 2022-12-30 MED ADMIN — sodium bicarbonate tablet 650 mg: 650 mg | ORAL | @ 01:00:00

## 2022-12-30 MED ADMIN — levothyroxine (SYNTHROID) tablet 112 mcg: 112 ug | ORAL | @ 10:00:00 | Stop: 2022-12-30

## 2022-12-30 MED ADMIN — hydrALAZINE (APRESOLINE) tablet 50 mg: 50 mg | ORAL | @ 13:00:00 | Stop: 2022-12-30

## 2022-12-30 MED ADMIN — heparin (porcine) 5,000 unit/mL injection 5,000 Units: 5000 [IU] | SUBCUTANEOUS | @ 10:00:00 | Stop: 2022-12-30

## 2022-12-30 MED ADMIN — spironolactone (ALDACTONE) tablet 25 mg: 25 mg | ORAL | @ 13:00:00 | Stop: 2022-12-30

## 2022-12-30 MED ADMIN — predniSONE (DELTASONE) tablet 5 mg: 5 mg | ORAL | @ 13:00:00 | Stop: 2022-12-30

## 2022-12-30 MED ADMIN — magnesium oxide-Mg AA chelate (Magnesium Plus Protein) 1 tablet: 1 | ORAL | @ 13:00:00 | Stop: 2022-12-30

## 2022-12-30 MED ADMIN — carvedilol (COREG) tablet 25 mg: 25 mg | ORAL | @ 01:00:00

## 2022-12-30 MED ADMIN — tacrolimus (PROGRAF) capsule 1 mg: 1 mg | ORAL | @ 01:00:00

## 2022-12-30 MED ADMIN — amlodipine (NORVASC) tablet 5 mg: 5 mg | ORAL | @ 13:00:00 | Stop: 2022-12-30

## 2022-12-30 MED ADMIN — magnesium oxide-Mg AA chelate (Magnesium Plus Protein) 1 tablet: 1 | ORAL | @ 01:00:00

## 2022-12-30 MED ADMIN — pantoprazole (Protonix) EC tablet 40 mg: 40 mg | ORAL | @ 13:00:00 | Stop: 2022-12-30

## 2022-12-30 MED ADMIN — heparin (porcine) 5,000 unit/mL injection 5,000 Units: 5000 [IU] | SUBCUTANEOUS | @ 01:00:00

## 2022-12-30 MED ADMIN — amlodipine (NORVASC) tablet 5 mg: 5 mg | ORAL | @ 01:00:00

## 2022-12-30 MED ADMIN — ertapenem (INVANZ) 1 g in sodium chloride 0.9 % (NS) 100 mL IVPB-MBP: 1 g | INTRAVENOUS | @ 10:00:00 | Stop: 2022-12-30

## 2022-12-30 MED ADMIN — sodium bicarbonate tablet 650 mg: 650 mg | ORAL | @ 13:00:00 | Stop: 2022-12-30

## 2022-12-30 NOTE — Unmapped (Signed)
Pt currently resting quietly in bed, eyes closed, no ASD noted. Pt A&O, VSS, denies pain. Blood sugars monitored, no ssi needed. Telemetry monitoring in place. Labs drawn from CVC. Husband at the bedside. Pt was free from falls or injury this shift. Bed in low and locked position. Pt expresses no other needs at this time. Call bell and tray table within reach.       Problem: Adult Inpatient Plan of Care  Goal: Plan of Care Review  Outcome: Progressing  Goal: Patient-Specific Goal (Individualized)  Outcome: Progressing  Goal: Absence of Hospital-Acquired Illness or Injury  Outcome: Progressing  Intervention: Identify and Manage Fall Risk  Recent Flowsheet Documentation  Taken 12/29/2022 2000 by Jolayne Haines, RN  Safety Interventions:   low bed   lighting adjusted for tasks/safety   family at bedside   fall reduction program maintained   nonskid shoes/slippers when out of bed  Intervention: Prevent Skin Injury  Recent Flowsheet Documentation  Taken 12/29/2022 2000 by Jolayne Haines, RN  Device Skin Pressure Protection: absorbent pad utilized/changed  Skin Protection: adhesive use limited  Intervention: Prevent Infection  Recent Flowsheet Documentation  Taken 12/29/2022 2000 by Jolayne Haines, RN  Infection Prevention:   hand hygiene promoted   rest/sleep promoted   single patient room provided  Goal: Optimal Comfort and Wellbeing  Outcome: Progressing  Goal: Readiness for Transition of Care  Outcome: Progressing  Goal: Rounds/Family Conference  Outcome: Progressing     Problem: Self-Care Deficit  Goal: Improved Ability to Complete Activities of Daily Living  Outcome: Progressing     Problem: Fall Injury Risk  Goal: Absence of Fall and Fall-Related Injury  Outcome: Progressing  Intervention: Promote Injury-Free Environment  Recent Flowsheet Documentation  Taken 12/29/2022 2000 by Jolayne Haines, RN  Safety Interventions:   low bed   lighting adjusted for tasks/safety   family at bedside   fall reduction program maintained   nonskid shoes/slippers when out of bed     Problem: Wound  Goal: Optimal Coping  Outcome: Progressing  Goal: Optimal Functional Ability  Outcome: Progressing  Goal: Absence of Infection Signs and Symptoms  Outcome: Progressing  Intervention: Prevent or Manage Infection  Recent Flowsheet Documentation  Taken 12/29/2022 2000 by Jolayne Haines, RN  Infection Management: aseptic technique maintained  Goal: Improved Oral Intake  Outcome: Progressing  Goal: Optimal Pain Control and Function  Outcome: Progressing  Goal: Skin Health and Integrity  Outcome: Progressing  Intervention: Optimize Skin Protection  Recent Flowsheet Documentation  Taken 12/29/2022 2000 by Jolayne Haines, RN  Pressure Reduction Techniques: frequent weight shift encouraged  Head of Bed (HOB) Positioning: HOB elevated  Pressure Reduction Devices: pressure-redistributing mattress utilized  Skin Protection: adhesive use limited  Goal: Optimal Wound Healing  Outcome: Progressing     Problem: Chronic Kidney Disease  Goal: Optimal Coping with Chronic Illness  Outcome: Progressing  Goal: Electrolyte Balance  Outcome: Progressing  Goal: Fluid Balance  Outcome: Progressing  Intervention: Monitor and Manage Hypervolemia  Recent Flowsheet Documentation  Taken 12/29/2022 2000 by Jolayne Haines, RN  Skin Protection: adhesive use limited  Goal: Optimal Functional Ability  Outcome: Progressing  Goal: Absence of Anemia Signs and Symptoms  Outcome: Progressing  Intervention: Manage Signs of Anemia and Bleeding  Recent Flowsheet Documentation  Taken 12/29/2022 2000 by Jolayne Haines, RN  Bleeding Precautions: blood pressure closely monitored  Goal: Optimal Oral Intake  Outcome: Progressing  Goal: Acceptable Pain Control  Outcome: Progressing  Goal: Minimize  Renal Failure Effects  Outcome: Progressing     Problem: Bleeding Risk or Actual (Thrombocytopenia)  Goal: Absence of Bleeding  Outcome: Progressing  Intervention: Minimize Bleeding Risk  Recent Flowsheet Documentation  Taken 12/29/2022 2000 by Jolayne Haines, RN  Bleeding Precautions: blood pressure closely monitored     Problem: Comorbidity Management  Goal: Blood Glucose Levels Within Targeted Range  Outcome: Progressing  Intervention: Monitor and Manage Glycemia  Recent Flowsheet Documentation  Taken 12/29/2022 2000 by Jolayne Haines, RN  Glycemic Management: blood glucose monitored  Goal: Blood Pressure in Desired Range  Outcome: Progressing

## 2022-12-30 NOTE — Unmapped (Signed)
Pt got powerline today. K+ high and delayed discharge. Called infusion services and rescheduled Abx delivery to tomorrow. Pt was very upset about not being able to going home.  BG was high before dinner and need coverage.

## 2023-01-01 ENCOUNTER — Telehealth: Payer: Self-pay

## 2023-01-01 DIAGNOSIS — Z940 Kidney transplant status: Secondary | ICD-10-CM | POA: Diagnosis not present

## 2023-01-01 LAB — RENAL FUNCTION PANEL
ALBUMIN: 4 g/dL (ref 3.8–4.8)
BLOOD UREA NITROGEN: 40 mg/dL — ABNORMAL HIGH (ref 8–27)
BUN / CREAT RATIO: 24 (ref 12–28)
CALCIUM: 9.6 mg/dL (ref 8.7–10.3)
CHLORIDE: 105 mmol/L (ref 96–106)
CO2: 20 mmol/L (ref 20–29)
CREATININE: 1.66 mg/dL — ABNORMAL HIGH (ref 0.57–1.00)
EGFR: 32 mL/min/{1.73_m2} — ABNORMAL LOW
GLUCOSE: 164 mg/dL — ABNORMAL HIGH (ref 70–99)
PHOSPHORUS, SERUM: 5.1 mg/dL — ABNORMAL HIGH (ref 3.0–4.3)
POTASSIUM: 5.2 mmol/L (ref 3.5–5.2)
SODIUM: 140 mmol/L (ref 134–144)

## 2023-01-01 LAB — MAGNESIUM: MAGNESIUM: 1.7 mg/dL (ref 1.6–2.3)

## 2023-01-01 NOTE — Telephone Encounter (Signed)
Transition Care Management Unsuccessful Follow-up Telephone Call  Date of discharge and from where:  12/25/2022 [facility]  Attempts:  2nd Attempt  Reason for unsuccessful TCM follow-up call:  No answer/busy  Azra Abrell [facility]  [facility] Population Health Community Resource Care Guide   ??[email or web address]  ?? [phone]   Website: [email or web address]  [email or web address]

## 2023-01-03 DIAGNOSIS — E113412 Type 2 diabetes mellitus with severe nonproliferative diabetic retinopathy with macular edema, left eye: Secondary | ICD-10-CM | POA: Diagnosis not present

## 2023-01-03 DIAGNOSIS — Z794 Long term (current) use of insulin: Secondary | ICD-10-CM | POA: Diagnosis not present

## 2023-01-03 DIAGNOSIS — Z940 Kidney transplant status: Secondary | ICD-10-CM | POA: Diagnosis not present

## 2023-01-03 DIAGNOSIS — E1121 Type 2 diabetes mellitus with diabetic nephropathy: Secondary | ICD-10-CM | POA: Diagnosis not present

## 2023-01-03 LAB — CBC W/ DIFFERENTIAL
BASOPHILS ABSOLUTE COUNT: 0 10*3/uL (ref 0.0–0.2)
BASOPHILS RELATIVE PERCENT: 0 %
EOSINOPHILS ABSOLUTE COUNT: 0.2 10*3/uL (ref 0.0–0.4)
EOSINOPHILS RELATIVE PERCENT: 4 %
HEMATOCRIT: 28.2 % — ABNORMAL LOW (ref 34.0–46.6)
HEMOGLOBIN: 8.9 g/dL — ABNORMAL LOW (ref 11.1–15.9)
LYMPHOCYTES ABSOLUTE COUNT: 1 10*3/uL (ref 0.7–3.1)
LYMPHOCYTES RELATIVE PERCENT: 21 %
MEAN CORPUSCULAR HEMOGLOBIN CONC: 31.6 g/dL (ref 31.5–35.7)
MEAN CORPUSCULAR HEMOGLOBIN: 31.6 pg (ref 26.6–33.0)
MEAN CORPUSCULAR VOLUME: 100 fL — ABNORMAL HIGH (ref 79–97)
MONOCYTES ABSOLUTE COUNT: 0.3 10*3/uL (ref 0.1–0.9)
MONOCYTES RELATIVE PERCENT: 7 %
NEUTROPHILS ABSOLUTE COUNT: 3.2 10*3/uL (ref 1.4–7.0)
NEUTROPHILS RELATIVE PERCENT: 68 %
PLATELET COUNT: 138 10*3/uL — ABNORMAL LOW (ref 150–450)
RED BLOOD CELL COUNT: 2.82 x10E6/uL — ABNORMAL LOW (ref 3.77–5.28)
RED CELL DISTRIBUTION WIDTH: 12.8 % (ref 11.7–15.4)
WHITE BLOOD CELL COUNT: 4.7 10*3/uL (ref 3.4–10.8)

## 2023-01-03 LAB — TACROLIMUS LEVEL: TACROLIMUS BLOOD: 4.9 ng/mL (ref 2.0–20.0)

## 2023-01-04 ENCOUNTER — Institutional Professional Consult (permissible substitution): Admit: 2023-01-04 | Discharge: 2023-01-05 | Payer: MEDICARE

## 2023-01-04 DIAGNOSIS — N390 Urinary tract infection, site not specified: Principal | ICD-10-CM

## 2023-01-04 NOTE — Unmapped (Unsigned)
KIDNEY POST-TRANSPLANT ASSESSMENT   Clinical Social Worker Telephone Note    Name:Bellany Rayyan Cue  Date of Birth:1951-05-29  ZOX:[id number]    REFERRAL INFORMATION:    Rye Dwelle is s/p transplant for kidney transplantation . CSW follows up to assess general check-in.    PREFERRED LANGUAGE: English/Korean    INTERPRETER UTILIZED: N/A    TRANSPLANT DATE:   08/06/2022 (Kidney)    POST TXP RN COORDINATOR:   Celene Squibb [phone]; fax/([phone]) [phone]    SUMMARY:  Called pt to check in s/p last DC.  No answer.      Left VM and requested call back.    F/up q 3-4 weeks for general monitoring.      Lowella Petties, LCSW, CCTSW  Transplant Case Manager  [facility]  01/04/2023      FEELING OK... HAVE ANOTHER 21 DAYS OF ABX...    THINK EVERYTHING IS OK... SPOUSE IS OK...    F/up q 6 weeks

## 2023-01-04 NOTE — Unmapped (Signed)
Immunocompromised Host Infectious Diseases Complex Outpatient Antimicrobial Therapy (COpAT) Service    Julie [name]  is a 72 y.o. female followed by ICID COpAT program for transplant pyelonephritis.  Allergies: Gabapentin and Valacyclovir  Microbiology: 12/25/22 - Urine culture - Clean catch; Escherichia coli (S: cefepime, erta, gent, mero, nitro, tobra -- R: amp, aztreo, cefazolin, ceftriax, cephalexin, ciproflox, levoflox, tet, TMP/SMX -- I: ceftaz)  Antimicrobial regimen: ertapenem 1g q24h  Antimicrobial start date: 12/25/22  Antimicrobial end date (contingent upon): 01/15/23  Line Access (date of placement): SL Powerline R Subclavian, 12/29/22    Relevant results:    Lab Results   Component Value Date    WBC 4.7 01/01/2023    WBC 3.1 (L) 12/30/2022    WBC 2.9 (L) 12/29/2022    NEUTROABS 3.2 01/01/2023    NEUTROABS 2.0 12/30/2022    NEUTROABS 2.6 12/29/2022    EOSABS 0.2 01/01/2023    EOSABS 0.1 12/30/2022    EOSABS 0.0 12/29/2022    PLT 138 (L) 01/01/2023    PLT 105 (L) 12/30/2022    PLT 103 (L) 12/29/2022     Lab Results   Component Value Date    BUN 40 (H) 01/01/2023    BUN 39 (H) 12/30/2022    BUN 42 (H) 12/29/2022    CREATININE 1.66 (H) 01/01/2023    CREATININE 1.20 (H) 12/30/2022    CREATININE 1.22 (H) 12/29/2022     Lab Results   Component Value Date    ALBUMIN 3.2 (L) 12/30/2022    ALBUMIN 2.9 (L) 12/29/2022    ALBUMIN 2.9 (L) 12/28/2022     Lab Results   Component Value Date    ALKPHOS 70 12/07/2022    ALKPHOS 90 11/04/2022    ALKPHOS 89 11/04/2022    BILITOT 0.3 12/07/2022    BILITOT 0.3 11/04/2022    BILITOT 0.3 11/04/2022    ALT 8 (L) 12/07/2022    ALT 32 11/04/2022    ALT 31 11/04/2022    AST 14 12/07/2022    AST 31 11/04/2022    AST 34 11/04/2022     Assessment:  Scr inc to 1.66 from 1.2. Est CrCl 20-25 ml/min. She was on the cutoff of renal adjustment when discharged. Given increased Scr will adjust ertapenem to 500mg  q24h. CBC w/diff WNL. CMP not collected.     Laboratory Monitoring:   CBC w/differential once weekly.  CMP once weekly.     Next scheduled labs: week of 01/08/23    Next ICID appointment: 01/18/23 with Tyrone Sage, Arlene    Action taken:  1. Decrease to ertapenem 500mg  q24h through anticipated end date of 01/15/23.  2. Plan discussed with home infusion and patient's husband  3. Time spent on documentation and coordination of care: 24 minutes    Tracey Harries, CPP  ICID COpAT Program  Phone: [phone]  Fax:  [phone]

## 2023-01-06 DIAGNOSIS — A041 Enterotoxigenic Escherichia coli infection: Secondary | ICD-10-CM | POA: Diagnosis not present

## 2023-01-09 DIAGNOSIS — N12 Tubulo-interstitial nephritis, not specified as acute or chronic: Secondary | ICD-10-CM | POA: Diagnosis not present

## 2023-01-09 DIAGNOSIS — T8613 Kidney transplant infection: Secondary | ICD-10-CM | POA: Diagnosis not present

## 2023-01-09 DIAGNOSIS — E1122 Type 2 diabetes mellitus with diabetic chronic kidney disease: Secondary | ICD-10-CM | POA: Diagnosis not present

## 2023-01-09 DIAGNOSIS — D631 Anemia in chronic kidney disease: Secondary | ICD-10-CM | POA: Diagnosis not present

## 2023-01-09 DIAGNOSIS — G9341 Metabolic encephalopathy: Secondary | ICD-10-CM | POA: Diagnosis not present

## 2023-01-09 DIAGNOSIS — B9620 Unspecified Escherichia coli [E. coli] as the cause of diseases classified elsewhere: Secondary | ICD-10-CM | POA: Diagnosis not present

## 2023-01-09 DIAGNOSIS — I509 Heart failure, unspecified: Secondary | ICD-10-CM | POA: Diagnosis not present

## 2023-01-09 DIAGNOSIS — I110 Hypertensive heart disease with heart failure: Secondary | ICD-10-CM | POA: Diagnosis not present

## 2023-01-09 DIAGNOSIS — N189 Chronic kidney disease, unspecified: Secondary | ICD-10-CM | POA: Diagnosis not present

## 2023-01-10 DIAGNOSIS — M5442 Lumbago with sciatica, left side: Secondary | ICD-10-CM | POA: Diagnosis not present

## 2023-01-10 DIAGNOSIS — M5416 Radiculopathy, lumbar region: Secondary | ICD-10-CM | POA: Diagnosis not present

## 2023-01-10 NOTE — Unmapped (Signed)
Patient paged on call.    Called back x 2. No answer, No VM set up

## 2023-01-10 NOTE — Unmapped (Unsigned)
Call to patient's spouse regarding HH and home infusion questions.  Confirmed with patient and dc paperwork that pts IV infusion last day will be Monday 6/24.  She has ICID follow up appt on Thursday and once cleared can schedule pt to have central line removed.  Pt will get labs on Thursday when she comes to the clinic.

## 2023-01-13 DIAGNOSIS — A041 Enterotoxigenic Escherichia coli infection: Secondary | ICD-10-CM | POA: Diagnosis not present

## 2023-01-16 DIAGNOSIS — T8613 Kidney transplant infection: Secondary | ICD-10-CM | POA: Diagnosis not present

## 2023-01-16 DIAGNOSIS — Z794 Long term (current) use of insulin: Secondary | ICD-10-CM | POA: Diagnosis not present

## 2023-01-16 DIAGNOSIS — Z940 Kidney transplant status: Secondary | ICD-10-CM | POA: Diagnosis not present

## 2023-01-16 DIAGNOSIS — D631 Anemia in chronic kidney disease: Secondary | ICD-10-CM | POA: Diagnosis not present

## 2023-01-16 DIAGNOSIS — N12 Tubulo-interstitial nephritis, not specified as acute or chronic: Secondary | ICD-10-CM | POA: Diagnosis not present

## 2023-01-16 DIAGNOSIS — E1121 Type 2 diabetes mellitus with diabetic nephropathy: Secondary | ICD-10-CM | POA: Diagnosis not present

## 2023-01-16 DIAGNOSIS — E1122 Type 2 diabetes mellitus with diabetic chronic kidney disease: Secondary | ICD-10-CM | POA: Diagnosis not present

## 2023-01-16 DIAGNOSIS — G9341 Metabolic encephalopathy: Secondary | ICD-10-CM | POA: Diagnosis not present

## 2023-01-16 DIAGNOSIS — B9620 Unspecified Escherichia coli [E. coli] as the cause of diseases classified elsewhere: Secondary | ICD-10-CM | POA: Diagnosis not present

## 2023-01-16 DIAGNOSIS — N189 Chronic kidney disease, unspecified: Secondary | ICD-10-CM | POA: Diagnosis not present

## 2023-01-16 LAB — CBC W/ DIFFERENTIAL
BANDED NEUTROPHILS ABSOLUTE COUNT: 0 10*3/uL (ref 0.0–0.1)
BASOPHILS ABSOLUTE COUNT: 0 10*3/uL (ref 0.0–0.2)
BASOPHILS RELATIVE PERCENT: 0 %
EOSINOPHILS ABSOLUTE COUNT: 0.1 10*3/uL (ref 0.0–0.4)
EOSINOPHILS RELATIVE PERCENT: 2 %
HEMATOCRIT: 29.1 % — ABNORMAL LOW (ref 34.0–46.6)
HEMOGLOBIN: 9.2 g/dL — ABNORMAL LOW (ref 11.1–15.9)
IMMATURE GRANULOCYTES: 1 %
LYMPHOCYTES ABSOLUTE COUNT: 1.2 10*3/uL (ref 0.7–3.1)
LYMPHOCYTES RELATIVE PERCENT: 35 %
MEAN CORPUSCULAR HEMOGLOBIN CONC: 31.6 g/dL (ref 31.5–35.7)
MEAN CORPUSCULAR HEMOGLOBIN: 30.9 pg (ref 26.6–33.0)
MEAN CORPUSCULAR VOLUME: 98 fL — ABNORMAL HIGH (ref 79–97)
MONOCYTES ABSOLUTE COUNT: 0.4 10*3/uL (ref 0.1–0.9)
MONOCYTES RELATIVE PERCENT: 11 %
NEUTROPHILS ABSOLUTE COUNT: 1.7 10*3/uL (ref 1.4–7.0)
NEUTROPHILS RELATIVE PERCENT: 51 %
PLATELET COUNT: 113 10*3/uL — ABNORMAL LOW (ref 150–450)
RED BLOOD CELL COUNT: 2.98 x10E6/uL — ABNORMAL LOW (ref 3.77–5.28)
RED CELL DISTRIBUTION WIDTH: 12.4 % (ref 11.7–15.4)
WHITE BLOOD CELL COUNT: 3.4 10*3/uL (ref 3.4–10.8)

## 2023-01-16 LAB — RENAL FUNCTION PANEL
ALBUMIN: 4.1 g/dL (ref 3.8–4.8)
BLOOD UREA NITROGEN: 43 mg/dL — ABNORMAL HIGH (ref 8–27)
BUN / CREAT RATIO: 25 (ref 12–28)
CALCIUM: 9.1 mg/dL (ref 8.7–10.3)
CHLORIDE: 107 mmol/L — ABNORMAL HIGH (ref 96–106)
CO2: 22 mmol/L (ref 20–29)
CREATININE: 1.74 mg/dL — ABNORMAL HIGH (ref 0.57–1.00)
EGFR: 31 mL/min/{1.73_m2} — ABNORMAL LOW
GLUCOSE: 110 mg/dL — ABNORMAL HIGH (ref 70–99)
PHOSPHORUS, SERUM: 4.4 mg/dL — ABNORMAL HIGH (ref 3.0–4.3)
POTASSIUM: 4.8 mmol/L (ref 3.5–5.2)
SODIUM: 142 mmol/L (ref 134–144)

## 2023-01-16 LAB — MAGNESIUM: MAGNESIUM: 1.9 mg/dL (ref 1.6–2.3)

## 2023-01-18 ENCOUNTER — Ambulatory Visit: Admit: 2023-01-18 | Discharge: 2023-01-19 | Payer: MEDICARE

## 2023-01-18 DIAGNOSIS — N390 Urinary tract infection, site not specified: Secondary | ICD-10-CM | POA: Diagnosis not present

## 2023-01-18 DIAGNOSIS — Z79890 Hormone replacement therapy (postmenopausal): Secondary | ICD-10-CM | POA: Diagnosis not present

## 2023-01-18 DIAGNOSIS — N186 End stage renal disease: Secondary | ICD-10-CM | POA: Diagnosis not present

## 2023-01-18 DIAGNOSIS — Z794 Long term (current) use of insulin: Secondary | ICD-10-CM | POA: Diagnosis not present

## 2023-01-18 DIAGNOSIS — E1122 Type 2 diabetes mellitus with diabetic chronic kidney disease: Secondary | ICD-10-CM | POA: Diagnosis not present

## 2023-01-18 DIAGNOSIS — I2510 Atherosclerotic heart disease of native coronary artery without angina pectoris: Secondary | ICD-10-CM | POA: Diagnosis not present

## 2023-01-18 DIAGNOSIS — Z992 Dependence on renal dialysis: Secondary | ICD-10-CM | POA: Diagnosis not present

## 2023-01-18 DIAGNOSIS — E785 Hyperlipidemia, unspecified: Secondary | ICD-10-CM | POA: Diagnosis not present

## 2023-01-18 DIAGNOSIS — Z940 Kidney transplant status: Secondary | ICD-10-CM | POA: Diagnosis not present

## 2023-01-18 LAB — TACROLIMUS LEVEL: TACROLIMUS BLOOD: 5.4 ng/mL (ref 2.0–20.0)

## 2023-01-18 MED ORDER — PREDNISONE 5 MG TABLET
ORAL_TABLET | Freq: Every day | ORAL | 3 refills | 90 days | Status: CP
Start: 2023-01-18 — End: ?

## 2023-01-18 MED ORDER — SPIRONOLACTONE 25 MG TABLET
ORAL_TABLET | Freq: Every day | ORAL | 3 refills | 90 days | Status: CP
Start: 2023-01-18 — End: ?

## 2023-01-18 NOTE — Unmapped (Signed)
IMMUNOCOMPROMISED HOST INFECTIOUS DISEASE PROGRESS NOTE    Assessment/Plan:     [name] is a 72 y.o. female who presents for recurrent UTIs.    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 Had perinephric drain in place postoperatively, removed 08/30/22  - Immunosuppression: Tacrolimus (goal 6-8), MMF 500 BID, Pred 5  - Prophylaxis: valganciclovir x 3 months (mod risk, completed), TMP+SMX x 6 months  Estimated Creatinine Clearance: 33.7 mL/min (A) (based on SCr of 1.16 mg/dL (H)).      - Right subclavian vein tunneled line 12/29/22     Pertinent comorbidities:  ESRD was on PD and HD  DM II (A1c 5.8 on 12/07/22)  CAD  HLD  SBO 10/07/22 s/p ex lap 10/13/22 w/ lysis of adhesions     Pertinent exposures:  Originally from [place]   Treated with ivermectin at the ID clinic in 2018  Increased amount of gardening and outdoor exposures over past several months     Summary of pertinent prior infections:  #History of Shingles 2017  #04/2018 Dialysis fluid [place]ia R: Ampicillin, Unasyn, Cefazolin,S: Ceftriaxone, Gentamicin, Levo, PipTazo, Tobra  #Thrush w/presumed candidal esophagitis 10/10/22 s/p 14 day treatment fluconazole  #Donor urine cx with C. Dubliensis 08/05/22, <10K, negative recipient samples, negative donor blood cx, treated with 2 weeks PO fluconazole    # Recurrent MDR E.Coli Bacteremia/UTI  - 1/29 BCx/UCx E. Coli - s/p Levo x 3 weeks (thru stent removal, 3/15) (S: Cefazolin, Cipro/Levo, Pip-tazo, Cephalexin; R: TMP-SMX, Amp, Amp-sulb)  - 3/14 BCx/UCx ESBL/FQ-R E. Coli s/p 14 days Meropenem; also with perinephric fluid collection; (S: Erta, Gent, Mero, Tobra, Cefepime SDD; I: ceftaz; R: Cipro/Levo, Cefazolin, Ceftriaxone, Amp)  - 4/13 Bcx/Ucx E. Coli (R CFZ, CTX, cipro/levo, I ceftaz, SDD CFP, susc nitro). Treated vanc/mero -> mero x 2 weeks  - 4/29 started on nitrofurantoin prophylaxis  - 5/7 seen by urogyn, no structural issues planned to stop nitrofurantoin with switch to methenamine (not done), added vaginal estrogen/d-mannose.     #Perinephric fluid collection 10/05/22  - 3/19 IR guided drainage negative cx, Cr 1.5 not suggestive of urinoma  - 4/13 increased stranding, 4/16 not amenable to intervention per VIR     Active infection:  #Concern for recurrent MDR E. Coli UTI, despite nitrofurantoin ppx, transplant pyelonephritis 12/25/22  - 6/3 presented to [place] with fever, n/v. No urinary symptoms, no cough or URI symptoms  - 6/3 UA (cath) trace leuks, few bacteria, no UCx; BCx (NGTD); RSV/Flu/Covid neg  - 6/3 OSH CT AP: mild transplant perinephric edema, no hydronephrosis, no fluid collections  - 6/3 [place] BCx (NGTD), CMV/EBV/BK PCR neg; Crypto Ag neg UCx <10,000 E. Coli (S: Cefepime SDD; Erta/Mero, Gent, Nitrofurantoin, Tobra)  - 6/3 US renal txp: No solid masses or calculi. No perinephric collections identified. 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 This was discussed with her husband.  Will try to prescribe fosfomycin for UTI proph depending on insurance status.      Pt provided written information about fosfomycin  Followup wit Urogyn. However, they are requesting a provider closer to [place].    Antimicrobial prophylaxis required for transplant immunosuppression   Viral: Completed valcyte x 3 months  PJP: Continue TMP+SMX renally dosed equivalent 400/80 MWF x 6 months     Intensive toxicity monitoring for prescription antimicrobials   CBC w/diff at least once per week  CMP at least once per week  clinical assessments for rashes or other skin changes  Given hepB core+, needs LFTs q18m for first year, HBSAg and HBV DNA q36m for first year and with any rise in LFTs     Follow up in 6 weeks          Recommendations were communicated via shared medical record.    Bary Richard, MD  [facility] Division of Infectious Diseases    Subjective     External record(s): Primary team note: Discharge summary .    Independent historian(s): Family member reports history (spouse) in addition to the patient .       Interval History:     Pt was last seen by ICHID on 6/7 during hospitalization    Julie [name] is a 72 y.o. female s/p DDKT 08/06/22, HTN, HLD, T2DM, who had several recent admissions for MDR UTI and ESBL E.coli bacteremia treated with IV levofloxacin in January and IV meropenem in March 2024 and April 2024. Was on UTI prophylaxis with nitrofurantoin, estradiol cream, cranberry, and d-mannose. She was admitted 6/3-6/8 from OSH with fever, vomiting and MS change. Received empiric vancomycin and cefepime at OSH and transitioned to meropenem and continued vancomycin at [facility]. Pt had a urine cx with <10,000 E.coli, blood cultures from 6/3 at [facility] were negative. Review of CT report from [place] and [place] here show no evidence of abscess but stranding c/w pyelo. ICHID was consulted with plan was to treat with 21 day antibiotics course with presumed graft pyelonephritis with ertapenem until 6/24.     Pt and spouse reports she is doing well. Pt has had no symptoms, denies F/C, abdominal symptoms and dysuria.  Pt and spouse are not sure which medications she is taking since daughter prepares her pill box. I advised them that she should stop the nitrofurantoin due to her worsened kidney function, and increase fluid intake if possible.      BMedications:    Current Outpatient Medications:     alendronate (FOSAMAX) 70 MG tablet, Take 1 tablet (70 mg total) by mouth every seven (7) days., Disp: , Rfl:     amlodipine (NORVASC) 5 MG tablet, Take 1 tablet (5 mg total) by mouth every twelve (12) hours., Disp: 90 tablet, Rfl: 3    atorvastatin (LIPITOR) 10 MG tablet, Take 1 tablet (10 mg total) by mouth daily., Disp: 90 tablet, Rfl: 3    blood sugar diagnostic (ACCU-CHEK AVIVA PLUS TEST STRP) Strp, USE 1 STRIP TO CHECK GLUCOSE THREE TIMES DAILY, Disp: , Rfl:     blood sugar diagnostic (GLUCOSE BLOOD) Strp, Use to check blood sugar as directed with insulin 3 times a day & for symptoms of high  or low blood sugar., Disp: 100 strip, Rfl: 11    blood sugar diagnostic Strp, USE 1 STRIP TO CHECK GLUCOSE THREE TIMES DAILY, Disp: , Rfl:     blood-glucose meter kit, Use as directed. ACCU-CHEK AVIVA, Disp: , Rfl:     blood-glucose meter kit, Use as instructed, Disp: 1 each, Rfl: 0    blood-glucose sensor (DEXCOM G7 SENSOR) Devi, Use 1 sensor every 10 days., Disp: 3 each, Rfl: 11    carvedilol (COREG) 25 MG tablet, Take 1 tablet (25 mg total) by mouth two (2) times a day., Disp: 180 tablet, Rfl: 3    chlorthalidone (HYGROTON) 25 MG tablet, Take 1 tablet (25 mg total) by mouth daily., Disp: 90 tablet, Rfl: 3    cholecalciferol, vitamin D3 25 mcg, 1,000 units,, (CHOLECALCIFEROL-25 MCG, 1,000 UNIT,) 1,000 unit (25 mcg) tablet, Take 2 tablets (50 mcg total) by mouth daily., Disp: , Rfl:     CRANBERRY ORAL, Take 2,000 mg by mouth two (2) times a day., Disp: , Rfl:     d-mannose Powd, Take 1 tablet by mouth daily., Disp: , Rfl:     docusate sodium (COLACE) 100 MG capsule, Take 1 capsule (100 mg total) by mouth two (2) times a day as needed for constipation., Disp: 60 capsule, Rfl: 11    ertapenem 500 mg, OVERFILL 10 mL in sodium chloride 0.9 % 100 mL IVPB, Infuse 500 mg into a venous catheter daily., Disp: , Rfl:     estradiol (ESTRACE) 0.01 % (0.1 mg/gram) vaginal cream, Insert 2 g into the vagina nightly., Disp: 60 g, Rfl: 11    hydrALAZINE (APRESOLINE) 50 MG tablet, Take 1 tablet (50 mg total) by mouth two (2) times a day., Disp: 180 tablet, Rfl: 3    insulin aspart (NOVOLOG FLEXPEN SUBQ), Inject 0-8 Units under the skin Three (3) times a day before meals. *Max daily dose: 33 units*, Disp: , Rfl:     insulin glargine (BASAGLAR, LANTUS) 100 unit/mL (3 mL) injection pen, Inject 0.03 mL (3 Units total) under the skin nightly., Disp: 0.9 mL, Rfl: 0    lancets Misc, Use to check blood sugar as directed with insulin 3 times a day & for symptoms of high or low blood sugar., Disp: 100 each, Rfl: 0    levothyroxine (SYNTHROID) 112 MCG tablet, Take 1 tablet (112 mcg total) by mouth daily., Disp: 90 tablet, Rfl: 3    magnesium oxide-Mg AA chelate (MAGNESIUM PLUS PROTEIN) 133 mg, Take 1 tablet by mouth two (2) times a day., Disp: , Rfl:     mycophenolate (CELLCEPT) 250 mg capsule, Take 2 capsules (500 mg total) by mouth two (2) times a day., Disp: 360 capsule, Rfl: 3    pantoprazole (PROTONIX) 40 MG tablet, Take 1 tablet (40 mg total) by mouth daily., Disp: 90 tablet, Rfl: 3    pen needle, diabetic 32 gauge x 5/32 (4 mm) Ndle, Use with insulin up to 4 times/day as needed., Disp: 100 each, Rfl: 11    pen needle, diabetic 32 gauge x 5/32 Ndle, Use with insulin 2 times daily, Disp: , Rfl:     predniSONE (DELTASONE) 5 MG tablet, Take 1 tablet (5 mg total) by mouth in the morning., Disp: 90 tablet, Rfl: 3    SENNA 8.6 mg tablet, Take 1 tablet by mouth daily as needed for constipation., Disp: 30 tablet, Rfl: 11    sodium bicarbonate 650 mg tablet, Take 1 tablet (650 mg total) by mouth Three (3) times  a day., Disp: 90 tablet, Rfl: 0    spironolactone (ALDACTONE) 25 MG tablet, Take 1 tablet (25 mg total) by mouth daily., Disp: , Rfl:     sulfamethoxazole-trimethoprim (BACTRIM) 400-80 mg per tablet, Take 1 tablet (80 mg of trimethoprim total) by mouth Every Monday, Wednesday, and Friday., Disp: 36 tablet, Rfl: 1    tacrolimus (PROGRAF) 0.5 MG capsule, Take 2 capsules (1 mg total) by mouth daily AND 2 capsules (1 mg total) nightly., Disp: 120 capsule, Rfl: 11    Objective     Vital signs:  BP 158/50 (BP Site: R Arm)  - Pulse 68  - Ht 152.4 cm (5')  - Wt 54.4 kg (120 lb)  - BMI 23.44 kg/m??     Physical Exam:  Const [x]  vital signs above    [x]  NAD, non-toxic appearance []  Chronically ill-appearing, non-distressed        Eyes [x]  Lids normal bilaterally, conjunctiva anicteric and noninjected OU     [] PERRL  [] EOMI        ENMT [x]  Normal appearance of external nose and ears, no nasal discharge        [x]  MMM, no lesions on lips or gums [x]  No thrush, leukoplakia, oral lesions  []  Dentition good []  Edentulous []  Dental caries present  []  Hearing normal  []  TMs with good light reflexes bilaterally         Neck [x]  Neck of normal appearance and trachea midline        []  No thyromegaly, nodules, or tenderness   []  Full neck ROM        Lymph [x]  No LAD in neck     [x]  No LAD in supraclavicular area     []  No LAD in axillae   []  No LAD in epitrochlear chains     []  No LAD in inguinal areas        CV [x]  RRR            [x]  No peripheral edema     []  Pedal pulses intact   [x]  No abnormal heart sounds appreciated   []  Extremities WWP   R CVC      Resp [x]  Normal WOB at rest    []  No breathlessness with speaking, no coughing  []  CTA anteriorly    [x]  CTA posteriorly          GI [x]  Normal inspection, NTND   [x]  NABS     []  No umbilical hernia on exam       []  No hepatosplenomegaly     []  Inspection of perineal and perianal areas normal  No tenderness above transplanted kidney      GU []  Normal external genitalia     [] No urinary catheter present in urethra   []  No CVA tenderness    []  No tenderness over renal allograft        MSK [x]  No clubbing or cyanosis of hands       [x]  No vertebral point tenderness  [x]  No focal tenderness or abnormalities on palpation of joints in RUE, LUE, RLE, or LLE        Skin [x]  No rashes, lesions, or ulcers of visualized skin     []  Skin warm and dry to palpation         Neuro [x]  Face expression symmetric  [x]  Sensation to light touch grossly intact throughout    []  Moves extremities equally    [x]  No tremor noted        []   CNs II-XII grossly intact     []  DTRs normal and symmetric throughout []  Gait unremarkable        Psych [x]  Appropriate affect       []  Fluent speech         [x]  Attentive, good eye contact  []  Oriented to person, place, time          []  Judgment and insight are appropriate           Data for Medical Decision Making     I discussed mgm't w/qualified health care professional(s) involved in case: transplant coordinator  .    I reviewed CBC results (normal WBC), chemistry results (elevated creatinine, low GFR), and micro result(s) (urine cx results).    I independently visualized/interpreted not done.       Results in Past 30 Days  Result Component Current Result Ref Range Previous Result Ref Range   Absolute Eosinophils 0.1 (01/16/2023) 0.0 - 0.4 x10E3/uL 0.0 (01/09/2023) 0.0 - 0.4 uL   Absolute Lymphocytes 1.2 (01/16/2023) 0.7 - 3.1 x10E3/uL 0.5 (L) (01/09/2023) 0.7 - 3.1 x10E3/uL   Absolute Neutrophils 1.7 (01/16/2023) 1.4 - 7.0 x10E3/uL 1.2 (L) (01/09/2023) 1.4 - 7.0 x10E3/uL   Alkaline Phosphatase 58 (01/09/2023) 44 - 121 IU/L Not in Time Range    ALT 9 (01/09/2023) 0 - 32 U/L Not in Time Range    AST 14 (01/09/2023) 0 - 40 MIL Not in Time Range    BUN 43 (H) (01/16/2023) 8 - 27 mg/dL 43 (H) (1/61/0960) 8 - 27 mg/dL   Calcium 9.1 (4/54/0981) 8.7 - 10.3 mg/dL 9.0 (1/91/4782) 8.7 - 95.6   Creatinine 1.74 (H) (01/16/2023) 0.57 - 1.00 mg/dL 2.13 (H) (0/86/5784) 6.96 - 1.00 mg/dL   HGB 9.2 (L) (2/95/2841) 11.1 - 15.9 g/dL 8.8 (L) (10/14/4008) 27.2 - 15.9 g/dL   Magnesium 1.9 (5/36/6440) 1.6 - 2.3 mg/dL 1.7 (3/47/4259) 1.6 - 2.3 mg/dL   Phosphorus 2.9 (11/26/3873) 2.4 - 5.1 mg/dL 2.7 (12/26/3327) 2.4 - 5.1 mg/dL Platelet 518 (L) (8/41/6606) 150 - 450 x10E3/uL 99 (L) (01/09/2023) 150 - 450   Potassium 4.8 (01/16/2023) 3.5 - 5.2 mmol/L 5.0 (01/09/2023) 3.5 - 5.2   Total Bilirubin 0.3 (01/09/2023) 0.0 - 1.2 mg/dL Not in Time Range    WBC 3.4 (01/16/2023) 3.4 - 10.8 x10E3/uL 2.0 (L) (01/09/2023) 3.4 - 10.8 x10E3/uL       Microbiology:  Status: Edited Result - FINAL       Visible to patient: Yes (seen)       Next appt: 01/18/2023 at 11:00 AM in Infectious Diseases ([facility] Tyrone Sage, MD)    Specimen Information: Clean Catch; Urine   0 Result Notes  Urine Culture, Comprehensive <10,000 CFU/mL Escherichia coli Abnormal    Processed at Physicians Request      Specimen Source: Clean Catch        Resulting Agency: [facility]     Susceptibility     Escherichia coli     MIC SUSCEPTIBILITY RESULT     Ampicillin Resistant     Aztreonam Resistant     Cefazolin Resistant     Cefepime Susceptible-Dose Dependent 1     Ceftazidime Intermediate     Ceftriaxone Resistant     Cephalexin Resistant 2     Ciprofloxacin Resistant     Ertapenem Susceptible     Gentamicin Susceptible     Levofloxacin Resistant     Meropenem Susceptible     Nitrofurantoin Susceptible     Tetracycline Resistant  3     Tobramycin Susceptible     Trimethoprim + Sulfamethoxazole Resistant                    omponent 10/05/22 1002   Susceptibility, Aerobic MIC SEE COMMENTS Abnormal    Comment: SOURCE: URINE, urine, E.coli  SUSCEPTIBILITY, AEROBIC, MIC                           FINAL    ESCHERICHIA COLI    Organism identified by client.    ----------------------------------------------------------    Organism     ESCHERICHIA COLI    Antibiotic  MIC (mcg/mL)  Interpretation    ----------------------------------------------------------    Fosfomycin         <=64       S  ------------------------------------------------------------    S=SUSCEPTIBLE  I=INTERMEDIATE  R=RESISTANT    NS=NONSUSCEPTIBLE  SDD=SUSCEPTIBLE DOSE DEPENDENT  ------------------------------------------------------------

## 2023-01-19 MED ORDER — FOSFOMYCIN TROMETHAMINE 3 GRAM ORAL PACKET
PACK | ORAL | 6 refills | 24 days
Start: 2023-01-19 — End: 2023-07-20

## 2023-01-20 MED ORDER — FOSFOMYCIN TROMETHAMINE 3 GRAM ORAL PACKET
PACK | ORAL | 6 refills | 24 days | Status: CP
Start: 2023-01-20 — End: 2023-07-21

## 2023-01-22 DIAGNOSIS — G9341 Metabolic encephalopathy: Secondary | ICD-10-CM | POA: Diagnosis not present

## 2023-01-22 DIAGNOSIS — T8613 Kidney transplant infection: Secondary | ICD-10-CM | POA: Diagnosis not present

## 2023-01-22 DIAGNOSIS — I110 Hypertensive heart disease with heart failure: Secondary | ICD-10-CM | POA: Diagnosis not present

## 2023-01-22 DIAGNOSIS — B9620 Unspecified Escherichia coli [E. coli] as the cause of diseases classified elsewhere: Secondary | ICD-10-CM | POA: Diagnosis not present

## 2023-01-22 DIAGNOSIS — D631 Anemia in chronic kidney disease: Secondary | ICD-10-CM | POA: Diagnosis not present

## 2023-01-22 DIAGNOSIS — I509 Heart failure, unspecified: Secondary | ICD-10-CM | POA: Diagnosis not present

## 2023-01-22 DIAGNOSIS — E1122 Type 2 diabetes mellitus with diabetic chronic kidney disease: Secondary | ICD-10-CM | POA: Diagnosis not present

## 2023-01-22 DIAGNOSIS — N12 Tubulo-interstitial nephritis, not specified as acute or chronic: Secondary | ICD-10-CM | POA: Diagnosis not present

## 2023-01-22 DIAGNOSIS — N189 Chronic kidney disease, unspecified: Secondary | ICD-10-CM | POA: Diagnosis not present

## 2023-01-22 DIAGNOSIS — Z940 Kidney transplant status: Principal | ICD-10-CM

## 2023-01-23 LAB — COMPREHENSIVE METABOLIC PANEL
ALBUMIN: 4.1 g/dL (ref 3.8–48)
ALKALINE PHOSPHATASE: 56 (ref 44–121)
ALT (SGPT): 8 IU/L (ref 0–32)
AST (SGOT): 11 IU/L (ref 0–40)
BILIRUBIN TOTAL: 0.2 mg/dL (ref 0.0–1.2)
BLOOD UREA NITROGEN: 46 mg/dL — ABNORMAL HIGH (ref 8–27)
BUN / CREAT RATIO: 28
CALCIUM: 8.8 mg/dL (ref 8.7–10.3)
CHLORIDE: 109 — ABNORMAL HIGH (ref 96–106)
CO2: 18 mmol/L — ABNORMAL LOW (ref 20–29)
CREATININE: 1.63 mg/dL — ABNORMAL HIGH (ref 0.57–1.00)
GLOBULIN, TOTAL: 2.1 g/dL (ref 1.5–4.5)
GLUCOSE RANDOM: 169 mg/dL — ABNORMAL HIGH (ref 70–99)
POTASSIUM: 5.3 — ABNORMAL HIGH (ref 3.5–5.2)
PROTEIN TOTAL: 6.2 g/dL (ref 6.0–8.5)
SODIUM: 141 mmol/L

## 2023-01-23 LAB — CBC W/ DIFFERENTIAL
BASOPHILS ABSOLUTE COUNT: 0 uL (ref 0.0–0.2)
BASOPHILS RELATIVE PERCENT: 0 %
EOSINOPHILS ABSOLUTE COUNT: 0.1
EOSINOPHILS RELATIVE PERCENT: 1 %
HEMATOCRIT: 30 — ABNORMAL LOW (ref 34.0–46.6)
HEMOGLOBIN: 9.7 g/dL — ABNORMAL LOW (ref 11.1–15.9)
IMMATURE CELLS: 1
LYMPHOCYTES ABSOLUTE COUNT: 0.6 10*3/uL — ABNORMAL LOW (ref 0.7–3.1)
LYMPHOCYTES RELATIVE PERCENT: 10 %
MEAN CORPUSCULAR HEMOGLOBIN CONC: 32.3 g/dL (ref 31.5–35.7)
MEAN CORPUSCULAR HEMOGLOBIN: 32.4
MEAN CORPUSCULAR VOLUME: 100 — ABNORMAL HIGH
MONOCYTES ABSOLUTE COUNT: 0.3 uL (ref 0.1–0.9)
MONOCYTES RELATIVE PERCENT: 5 fl
NEUTROPHILS ABSOLUTE COUNT: 5.2 10*3/uL
NEUTROPHILS RELATIVE PERCENT: 83
PLATELET COUNT: 98 10*3/uL
RED BLOOD CELL COUNT: 2.99 uL — ABNORMAL LOW (ref 3.77–5.28)
RED CELL DISTRIBUTION WIDTH: 12.3
WHITE BLOOD CELL COUNT: 6.3

## 2023-01-23 LAB — EGFR(MDRD) AF-AM: EGFR MDRD NON AF AMER: 33 mL/min/{1.73_m2} — ABNORMAL LOW

## 2023-01-26 ENCOUNTER — Ambulatory Visit: Admit: 2023-01-26 | Discharge: 2023-01-27 | Payer: MEDICARE | Attending: Family | Primary: Family

## 2023-01-26 DIAGNOSIS — T8619 Other complication of kidney transplant: Secondary | ICD-10-CM | POA: Diagnosis not present

## 2023-01-26 DIAGNOSIS — Z940 Kidney transplant status: Secondary | ICD-10-CM | POA: Diagnosis not present

## 2023-01-26 DIAGNOSIS — Z1624 Resistance to multiple antibiotics: Secondary | ICD-10-CM | POA: Diagnosis not present

## 2023-01-26 DIAGNOSIS — R011 Cardiac murmur, unspecified: Secondary | ICD-10-CM | POA: Diagnosis not present

## 2023-01-26 DIAGNOSIS — E875 Hyperkalemia: Secondary | ICD-10-CM | POA: Diagnosis not present

## 2023-01-26 DIAGNOSIS — R7881 Bacteremia: Secondary | ICD-10-CM | POA: Diagnosis not present

## 2023-01-26 DIAGNOSIS — D696 Thrombocytopenia, unspecified: Secondary | ICD-10-CM | POA: Diagnosis not present

## 2023-01-26 DIAGNOSIS — R1110 Vomiting, unspecified: Secondary | ICD-10-CM | POA: Diagnosis not present

## 2023-01-26 DIAGNOSIS — D638 Anemia in other chronic diseases classified elsewhere: Secondary | ICD-10-CM | POA: Diagnosis not present

## 2023-01-26 DIAGNOSIS — N390 Urinary tract infection, site not specified: Secondary | ICD-10-CM | POA: Diagnosis not present

## 2023-01-26 DIAGNOSIS — N179 Acute kidney failure, unspecified: Secondary | ICD-10-CM | POA: Diagnosis not present

## 2023-01-26 DIAGNOSIS — R6521 Severe sepsis with septic shock: Secondary | ICD-10-CM | POA: Diagnosis not present

## 2023-01-26 DIAGNOSIS — T8613 Kidney transplant infection: Secondary | ICD-10-CM | POA: Diagnosis not present

## 2023-01-26 DIAGNOSIS — N261 Atrophy of kidney (terminal): Secondary | ICD-10-CM | POA: Diagnosis not present

## 2023-01-26 DIAGNOSIS — N2889 Other specified disorders of kidney and ureter: Secondary | ICD-10-CM | POA: Diagnosis not present

## 2023-01-26 DIAGNOSIS — R918 Other nonspecific abnormal finding of lung field: Secondary | ICD-10-CM | POA: Diagnosis not present

## 2023-01-26 DIAGNOSIS — R509 Fever, unspecified: Secondary | ICD-10-CM | POA: Diagnosis not present

## 2023-01-26 DIAGNOSIS — J811 Chronic pulmonary edema: Secondary | ICD-10-CM | POA: Diagnosis not present

## 2023-01-26 DIAGNOSIS — Z1629 Resistance to other single specified antibiotic: Secondary | ICD-10-CM | POA: Diagnosis not present

## 2023-01-26 DIAGNOSIS — E785 Hyperlipidemia, unspecified: Secondary | ICD-10-CM | POA: Diagnosis not present

## 2023-01-26 DIAGNOSIS — Z794 Long term (current) use of insulin: Secondary | ICD-10-CM | POA: Diagnosis not present

## 2023-01-26 DIAGNOSIS — D7389 Other diseases of spleen: Secondary | ICD-10-CM | POA: Diagnosis not present

## 2023-01-26 DIAGNOSIS — I10 Essential (primary) hypertension: Secondary | ICD-10-CM | POA: Diagnosis not present

## 2023-01-26 DIAGNOSIS — R4182 Altered mental status, unspecified: Secondary | ICD-10-CM | POA: Diagnosis not present

## 2023-01-26 DIAGNOSIS — E1169 Type 2 diabetes mellitus with other specified complication: Secondary | ICD-10-CM | POA: Diagnosis not present

## 2023-01-26 DIAGNOSIS — Z1612 Extended spectrum beta lactamase (ESBL) resistance: Secondary | ICD-10-CM | POA: Diagnosis not present

## 2023-01-26 DIAGNOSIS — D849 Immunodeficiency, unspecified: Secondary | ICD-10-CM | POA: Diagnosis not present

## 2023-01-26 DIAGNOSIS — N838 Other noninflammatory disorders of ovary, fallopian tube and broad ligament: Secondary | ICD-10-CM | POA: Diagnosis not present

## 2023-01-26 DIAGNOSIS — Z79899 Other long term (current) drug therapy: Secondary | ICD-10-CM | POA: Diagnosis not present

## 2023-01-26 DIAGNOSIS — K5909 Other constipation: Secondary | ICD-10-CM | POA: Diagnosis not present

## 2023-01-26 DIAGNOSIS — Z1152 Encounter for screening for COVID-19: Secondary | ICD-10-CM | POA: Diagnosis not present

## 2023-01-26 DIAGNOSIS — N2589 Other disorders resulting from impaired renal tubular function: Secondary | ICD-10-CM | POA: Diagnosis not present

## 2023-01-26 DIAGNOSIS — Z87440 Personal history of urinary (tract) infections: Secondary | ICD-10-CM | POA: Diagnosis not present

## 2023-01-26 DIAGNOSIS — R188 Other ascites: Secondary | ICD-10-CM | POA: Diagnosis not present

## 2023-01-26 DIAGNOSIS — I129 Hypertensive chronic kidney disease with stage 1 through stage 4 chronic kidney disease, or unspecified chronic kidney disease: Secondary | ICD-10-CM | POA: Diagnosis not present

## 2023-01-26 DIAGNOSIS — D84821 Immunodeficiency due to drugs: Secondary | ICD-10-CM | POA: Diagnosis not present

## 2023-01-26 DIAGNOSIS — B9620 Unspecified Escherichia coli [E. coli] as the cause of diseases classified elsewhere: Secondary | ICD-10-CM | POA: Diagnosis not present

## 2023-01-26 DIAGNOSIS — G9341 Metabolic encephalopathy: Secondary | ICD-10-CM | POA: Diagnosis not present

## 2023-01-26 DIAGNOSIS — Z452 Encounter for adjustment and management of vascular access device: Secondary | ICD-10-CM | POA: Diagnosis not present

## 2023-01-26 DIAGNOSIS — N189 Chronic kidney disease, unspecified: Secondary | ICD-10-CM | POA: Diagnosis not present

## 2023-01-26 DIAGNOSIS — Z79621 Long term (current) use of calcineurin inhibitor: Secondary | ICD-10-CM | POA: Diagnosis not present

## 2023-01-26 DIAGNOSIS — E8729 Other acidosis: Secondary | ICD-10-CM | POA: Diagnosis not present

## 2023-01-26 DIAGNOSIS — D649 Anemia, unspecified: Secondary | ICD-10-CM | POA: Diagnosis not present

## 2023-01-26 DIAGNOSIS — Z9911 Dependence on respirator [ventilator] status: Secondary | ICD-10-CM | POA: Diagnosis not present

## 2023-01-26 DIAGNOSIS — Z4682 Encounter for fitting and adjustment of non-vascular catheter: Secondary | ICD-10-CM | POA: Diagnosis not present

## 2023-01-26 DIAGNOSIS — I2510 Atherosclerotic heart disease of native coronary artery without angina pectoris: Secondary | ICD-10-CM | POA: Diagnosis not present

## 2023-01-26 DIAGNOSIS — R8271 Bacteriuria: Secondary | ICD-10-CM | POA: Diagnosis not present

## 2023-01-26 DIAGNOSIS — A419 Sepsis, unspecified organism: Secondary | ICD-10-CM | POA: Diagnosis not present

## 2023-01-26 DIAGNOSIS — E1122 Type 2 diabetes mellitus with diabetic chronic kidney disease: Secondary | ICD-10-CM | POA: Diagnosis not present

## 2023-01-26 NOTE — Unmapped (Signed)
Patient Name: Julie [name]  Patient Age: 72 y.o.  Encounter Date: 01/26/23   APP Interventional Radiologist: Alease Frame, NP    Referring Physician: Leafy Half, MD  [address]  [facility] [address]  [place],  [place] [address]  Primary Care Provider: Barbette Reichmann, MD      SUBJECTIVE      Reason for Visit: Tunneled central venous catheter removal    History of Present Illness: Julie [name] is a 72 y.o. female with history of DDKT on 08/06/22 2/2 T2DM.  She has had ongoing issues with post op infection requiring CVADS and antibiotic courses.  Patient has a tunneled PowerLine (single-lumen) that was placed on 12/29/22 for long term antibiotics. she no longer requires the line and presents for removal.    Allergies:  Allergies   Allergen Reactions    Gabapentin Other (See Comments)     Shakes    Valacyclovir Nausea Only and Other (See Comments)        OBJECTIVE     Physical Exam:  General: NAD  Respiratory: Breathing even and non labored  Neuro: Alert and oriented x 4    ASSESSMENT/PLAN     Julie [name] is a 72 y.o. female who presents for removal of tunneled central venous catheter.  --Prior to the procedure, a time out was performed. During the time out, the patient, procedure, allergies and procedure site when applicable were verbally verified by the team members and Alease Frame, NP.   --tunneled PowerLine (single-lumen) removed intact without issue & sterile dressing applied  --No further IR follow up required    Alease Frame, NP  01/26/23 1:58 PM

## 2023-01-26 NOTE — Unmapped (Signed)
After your line removal:  Avoid strenuous activity and heavy lifting (greater than 10 pounds) for 1 week.  Do not submerge site until healed. No tub baths, swimming pools until healed.    Dressing stays intact for 2 days.  After two days, remove dressing and gently wash site.   Dry and apply new clean dressing.   Wash site and apply new dressing daily until healed.  If steri-strips are present, let them fall off on their own.    You may return to your usual diet unless otherwise directed.   Please call [facility] [facility] at [phone] and choose option 3 to speak to a nurse for any problems or questions following discharge including:    Bleeding that saturates bandage  Pain at site not controlled by regular pain medicines  Signs of infection at site: redness, swelling, pus draining, fever above 101.5    If site bleeds, sit upright and hold pressure to site.   For rapid, uncontrolled bleeding hold firm pressure and seek emergency medical attention.     Calls received during normal business hours (Monday through Friday from 8:00 am to 4:30 pm) will be returned within one business day. Calls received after normal business hours OR on weekends will be returned during the next business day.     If you are experiencing a medical emergency, please call 911 or go to your nearest emergency room.

## 2023-01-27 ENCOUNTER — Encounter
Admit: 2023-01-27 | Discharge: 2023-02-06 | Disposition: A | Discharge status: Home Health | Payer: MEDICARE | Admitting: Infectious Disease

## 2023-01-27 ENCOUNTER — Ambulatory Visit
Admit: 2023-01-27 | Discharge: 2023-02-06 | Disposition: A | Discharge status: Home Health | Payer: MEDICARE | Admitting: Infectious Disease

## 2023-01-27 ENCOUNTER — Ambulatory Visit: Admit: 2023-01-27 | Payer: MEDICARE

## 2023-01-27 ENCOUNTER — Encounter: Admit: 2023-01-27 | Payer: MEDICARE

## 2023-01-27 ENCOUNTER — Encounter: Admit: 2023-01-27 | Discharge: 2023-02-06 | Payer: MEDICARE

## 2023-01-27 LAB — CBC W/ AUTO DIFF
BASOPHILS ABSOLUTE COUNT: 0 10*9/L (ref 0.0–0.1)
BASOPHILS RELATIVE PERCENT: 0.1 %
EOSINOPHILS ABSOLUTE COUNT: 0 10*9/L (ref 0.0–0.5)
EOSINOPHILS RELATIVE PERCENT: 0.1 %
HEMATOCRIT: 31.1 % — ABNORMAL LOW (ref 34.0–44.0)
HEMOGLOBIN: 10.3 g/dL — ABNORMAL LOW (ref 11.3–14.9)
LYMPHOCYTES ABSOLUTE COUNT: 0.4 10*9/L — ABNORMAL LOW (ref 1.1–3.6)
LYMPHOCYTES RELATIVE PERCENT: 3.7 %
MEAN CORPUSCULAR HEMOGLOBIN CONC: 33.2 g/dL (ref 32.0–36.0)
MEAN CORPUSCULAR HEMOGLOBIN: 31.8 pg (ref 25.9–32.4)
MEAN CORPUSCULAR VOLUME: 95.8 fL — ABNORMAL HIGH (ref 77.6–95.7)
MEAN PLATELET VOLUME: 9.3 fL (ref 6.8–10.7)
MONOCYTES ABSOLUTE COUNT: 0.6 10*9/L (ref 0.3–0.8)
MONOCYTES RELATIVE PERCENT: 4.9 %
NEUTROPHILS ABSOLUTE COUNT: 10.5 10*9/L — ABNORMAL HIGH (ref 1.8–7.8)
NEUTROPHILS RELATIVE PERCENT: 91.2 %
PLATELET COUNT: 78 10*9/L — ABNORMAL LOW (ref 150–450)
RED BLOOD CELL COUNT: 3.25 10*12/L — ABNORMAL LOW (ref 3.95–5.13)
RED CELL DISTRIBUTION WIDTH: 13.6 % (ref 12.2–15.2)
WBC ADJUSTED: 11.5 10*9/L — ABNORMAL HIGH (ref 3.6–11.2)

## 2023-01-27 LAB — COMPREHENSIVE METABOLIC PANEL
ALBUMIN: 3.9 g/dL (ref 3.4–5.0)
ALKALINE PHOSPHATASE: 57 U/L (ref 46–116)
ALT (SGPT): 11 U/L (ref 10–49)
ANION GAP: 9 mmol/L (ref 5–14)
AST (SGOT): 12 U/L (ref <=34)
BILIRUBIN TOTAL: 0.6 mg/dL (ref 0.3–1.2)
BLOOD UREA NITROGEN: 44 mg/dL — ABNORMAL HIGH (ref 9–23)
BUN / CREAT RATIO: 31
CALCIUM: 9.5 mg/dL (ref 8.7–10.4)
CHLORIDE: 112 mmol/L — ABNORMAL HIGH (ref 98–107)
CO2: 20 mmol/L (ref 20.0–31.0)
CREATININE: 1.4 mg/dL — ABNORMAL HIGH
EGFR CKD-EPI (2021) FEMALE: 40 mL/min/{1.73_m2} — ABNORMAL LOW (ref >=60)
GLUCOSE RANDOM: 208 mg/dL — ABNORMAL HIGH (ref 70–179)
POTASSIUM: 4.1 mmol/L (ref 3.4–4.8)
PROTEIN TOTAL: 7.1 g/dL (ref 5.7–8.2)
SODIUM: 141 mmol/L (ref 135–145)

## 2023-01-27 LAB — LACTATE SEPSIS, VENOUS: LACTATE BLOOD VENOUS: 2 mmol/L — ABNORMAL HIGH (ref 0.5–1.8)

## 2023-01-27 LAB — URINALYSIS WITH MICROSCOPY WITH CULTURE REFLEX PERFORMABLE
BILIRUBIN UA: NEGATIVE
GLUCOSE UA: 200 — AB
KETONES UA: NEGATIVE
NITRITE UA: POSITIVE — AB
PH UA: 6 (ref 5.0–9.0)
PROTEIN UA: 200 — AB
RBC UA: 4 /HPF (ref <=4)
SPECIFIC GRAVITY UA: 1.012 (ref 1.003–1.030)
SQUAMOUS EPITHELIAL: <1 /HPF (ref 0–5)
UROBILINOGEN UA: <2
WBC UA: >182 /HPF — ABNORMAL HIGH (ref 0–5)

## 2023-01-27 LAB — BLOOD GAS, VENOUS
BASE EXCESS VENOUS: -5.6 — ABNORMAL LOW (ref -2.0–2.0)
HCO3 VENOUS: 20 mmol/L — ABNORMAL LOW (ref 22–27)
O2 SATURATION VENOUS: 25 % — ABNORMAL LOW (ref 40.0–85.0)
PCO2 VENOUS: 43 mmHg (ref 40–60)
PH VENOUS: 7.29 — ABNORMAL LOW (ref 7.32–7.43)
PO2 VENOUS: 21 mmHg — ABNORMAL LOW (ref 30–55)

## 2023-01-27 LAB — BASIC METABOLIC PANEL
ANION GAP: 10 mmol/L (ref 5–14)
BLOOD UREA NITROGEN: 36 mg/dL — ABNORMAL HIGH (ref 9–23)
BUN / CREAT RATIO: 26
CALCIUM: 9.2 mg/dL (ref 8.7–10.4)
CHLORIDE: 110 mmol/L — ABNORMAL HIGH (ref 98–107)
CO2: 18 mmol/L — ABNORMAL LOW (ref 20.0–31.0)
CREATININE: 1.39 mg/dL — ABNORMAL HIGH
EGFR CKD-EPI (2021) FEMALE: 40 mL/min/{1.73_m2} — ABNORMAL LOW (ref >=60)
GLUCOSE RANDOM: 264 mg/dL — ABNORMAL HIGH (ref 70–179)
POTASSIUM: 4.6 mmol/L (ref 3.4–4.8)
SODIUM: 138 mmol/L (ref 135–145)

## 2023-01-27 LAB — HIGH SENSITIVITY TROPONIN I - SINGLE: HIGH SENSITIVITY TROPONIN I: 16 ng/L (ref <=34)

## 2023-01-27 LAB — HEMOGLOBIN A1C
ESTIMATED AVERAGE GLUCOSE: 117 mg/dL
HEMOGLOBIN A1C: 5.7 % — ABNORMAL HIGH (ref 4.8–5.6)

## 2023-01-27 LAB — LACTATE, VENOUS, WHOLE BLOOD: LACTATE BLOOD VENOUS: 3.1 mmol/L — ABNORMAL HIGH (ref 0.5–1.8)

## 2023-01-27 LAB — GREEN LITHIUM HEPARIN EXTRA TUBE

## 2023-01-27 MED ADMIN — lactated ringers bolus 1,000 mL: 1000 mL | INTRAVENOUS | @ 22:00:00 | Stop: 2023-01-27

## 2023-01-27 MED ADMIN — cefepime (MAXIPIME) 2 g in sodium chloride 0.9 % (NS) 100 mL IVPB-MBP: 2 g | INTRAVENOUS | @ 22:00:00 | Stop: 2023-01-27

## 2023-01-27 MED ADMIN — vancomycin (VANCOCIN) IVPB 1000 mg (premix): 1000 mg | INTRAVENOUS | @ 22:00:00 | Stop: 2023-01-27

## 2023-01-27 MED ADMIN — ondansetron (ZOFRAN) injection 4 mg: 4 mg | INTRAVENOUS | @ 20:00:00 | Stop: 2023-01-27

## 2023-01-27 NOTE — Unmapped (Signed)
Patient here with fever that started last night. Nausea and vomiting began this afternoon.     Patient vomiting in the WR. VSS. ABCs intact. Denies pain.

## 2023-01-27 NOTE — Unmapped (Signed)
VIR was paged that patient is experiencing fevers to 55 F today after having her tunneled central line removed in VIR clinic on 01/26/23. Spoke with patient's husband who relayed that she is also now experiencing vomiting. Discussed with patient's husband that her fevers and vomiting are unlikely to be a complication of her tunneled line removal, and is more likely related to her history of recurrent UTIs. Patient's husband was advised to contact the patient's PCP or visit an urgent care/emergency department for this issue. Patient's husband to bring the patient to the emergency department today.

## 2023-01-27 NOTE — Unmapped (Signed)
Pt husband called, pt with fever post power line removal, pt will need to speak with [facility]. Paged [facility], will follow up to make sure someone called them

## 2023-01-27 NOTE — Unmapped (Signed)
Patient here with fever and vomiting. Hx kidney transplant

## 2023-01-28 DIAGNOSIS — Z940 Kidney transplant status: Principal | ICD-10-CM

## 2023-01-28 LAB — BLOOD GAS, VENOUS
BASE EXCESS VENOUS: -6.5 — ABNORMAL LOW (ref -2.0–2.0)
BASE EXCESS VENOUS: -8.1 — ABNORMAL LOW (ref -2.0–2.0)
BASE EXCESS VENOUS: -6.7 — ABNORMAL LOW (ref -2.0–2.0)
BASE EXCESS VENOUS: -5.9 — ABNORMAL LOW (ref -2.0–2.0)
BASE EXCESS VENOUS: -5 — ABNORMAL LOW (ref -2.0–2.0)
BASE EXCESS VENOUS: -5.5 — ABNORMAL LOW (ref -2.0–2.0)
HCO3 VENOUS: 18 mmol/L — ABNORMAL LOW (ref 22–27)
HCO3 VENOUS: 17 mmol/L — ABNORMAL LOW (ref 22–27)
HCO3 VENOUS: 19 mmol/L — ABNORMAL LOW (ref 22–27)
HCO3 VENOUS: 20 mmol/L — ABNORMAL LOW (ref 22–27)
HCO3 VENOUS: 20 mmol/L — ABNORMAL LOW (ref 22–27)
HCO3 VENOUS: 19 mmol/L — ABNORMAL LOW (ref 22–27)
O2 SATURATION VENOUS: 78 % (ref 40.0–85.0)
O2 SATURATION VENOUS: 81.5 % (ref 40.0–85.0)
O2 SATURATION VENOUS: 85.5 % — ABNORMAL HIGH (ref 40.0–85.0)
O2 SATURATION VENOUS: 65.4 % (ref 40.0–85.0)
O2 SATURATION VENOUS: 72.2 % (ref 40.0–85.0)
O2 SATURATION VENOUS: 71.1 % (ref 40.0–85.0)
PCO2 VENOUS: 33 mmHg — ABNORMAL LOW (ref 40–60)
PCO2 VENOUS: 32 mmHg — ABNORMAL LOW (ref 40–60)
PCO2 VENOUS: 38 mmHg — ABNORMAL LOW (ref 40–60)
PCO2 VENOUS: 39 mmHg — ABNORMAL LOW (ref 40–60)
PCO2 VENOUS: 38 mmHg — ABNORMAL LOW (ref 40–60)
PCO2 VENOUS: 34 mmHg — ABNORMAL LOW (ref 40–60)
PH VENOUS: 7.35 (ref 7.32–7.43)
PH VENOUS: 7.34 (ref 7.32–7.43)
PH VENOUS: 7.31 — ABNORMAL LOW (ref 7.32–7.43)
PH VENOUS: 7.31 — ABNORMAL LOW (ref 7.32–7.43)
PH VENOUS: 7.34 (ref 7.32–7.43)
PH VENOUS: 7.37 (ref 7.32–7.43)
PO2 VENOUS: 45 mmHg (ref 30–55)
PO2 VENOUS: 46 mmHg (ref 30–55)
PO2 VENOUS: 53 mmHg (ref 30–55)
PO2 VENOUS: 36 mmHg (ref 30–55)
PO2 VENOUS: 41 mmHg (ref 30–55)
PO2 VENOUS: 39 mmHg (ref 30–55)

## 2023-01-28 LAB — COMPREHENSIVE METABOLIC PANEL
ALBUMIN: 2.7 g/dL — ABNORMAL LOW (ref 3.4–5.0)
ALKALINE PHOSPHATASE: 43 U/L — ABNORMAL LOW (ref 46–116)
ALT (SGPT): 8 U/L — ABNORMAL LOW (ref 10–49)
ANION GAP: 10 mmol/L (ref 5–14)
AST (SGOT): 11 U/L (ref <=34)
BILIRUBIN TOTAL: 0.4 mg/dL (ref 0.3–1.2)
BLOOD UREA NITROGEN: 43 mg/dL — ABNORMAL HIGH (ref 9–23)
BUN / CREAT RATIO: 22
CALCIUM: 8.2 mg/dL — ABNORMAL LOW (ref 8.7–10.4)
CHLORIDE: 113 mmol/L — ABNORMAL HIGH (ref 98–107)
CO2: 18 mmol/L — ABNORMAL LOW (ref 20.0–31.0)
CREATININE: 1.92 mg/dL — ABNORMAL HIGH
EGFR CKD-EPI (2021) FEMALE: 27 mL/min/{1.73_m2} — ABNORMAL LOW (ref >=60)
GLUCOSE RANDOM: 149 mg/dL — ABNORMAL HIGH (ref 70–99)
POTASSIUM: 3.8 mmol/L (ref 3.4–4.8)
PROTEIN TOTAL: 5.2 g/dL — ABNORMAL LOW (ref 5.7–8.2)
SODIUM: 141 mmol/L (ref 135–145)

## 2023-01-28 LAB — CBC
HEMATOCRIT: 25 % — ABNORMAL LOW (ref 34.0–44.0)
HEMOGLOBIN: 8.3 g/dL — ABNORMAL LOW (ref 11.3–14.9)
MEAN CORPUSCULAR HEMOGLOBIN CONC: 33.1 g/dL (ref 32.0–36.0)
MEAN CORPUSCULAR HEMOGLOBIN: 31.6 pg (ref 25.9–32.4)
MEAN CORPUSCULAR VOLUME: 95.3 fL (ref 77.6–95.7)
MEAN PLATELET VOLUME: 8.8 fL (ref 6.8–10.7)
PLATELET COUNT: 60 10*9/L — ABNORMAL LOW (ref 150–450)
RED BLOOD CELL COUNT: 2.62 10*12/L — ABNORMAL LOW (ref 3.95–5.13)
RED CELL DISTRIBUTION WIDTH: 13.5 % (ref 12.2–15.2)
WBC ADJUSTED: 9.6 10*9/L (ref 3.6–11.2)

## 2023-01-28 LAB — BLOOD GAS CRITICAL CARE PANEL, VENOUS
BASE EXCESS VENOUS: -6.5 — ABNORMAL LOW (ref -2.0–2.0)
BASE EXCESS VENOUS: -8.1 — ABNORMAL LOW (ref -2.0–2.0)
CALCIUM IONIZED VENOUS (MG/DL): 4.86 mg/dL (ref 4.40–5.40)
CALCIUM IONIZED VENOUS (MG/DL): 4.79 mg/dL (ref 4.40–5.40)
GLUCOSE WHOLE BLOOD: 153 mg/dL (ref 70–179)
GLUCOSE WHOLE BLOOD: 180 mg/dL — ABNORMAL HIGH (ref 70–179)
HCO3 VENOUS: 18 mmol/L — ABNORMAL LOW (ref 22–27)
HCO3 VENOUS: 17 mmol/L — ABNORMAL LOW (ref 22–27)
HEMOGLOBIN BLOOD GAS: 8.4 g/dL — ABNORMAL LOW
HEMOGLOBIN BLOOD GAS: 8.5 g/dL — ABNORMAL LOW (ref 12.00–16.00)
LACTATE BLOOD VENOUS: 2.1 mmol/L — ABNORMAL HIGH (ref 0.5–1.8)
LACTATE BLOOD VENOUS: 2.1 mmol/L — ABNORMAL HIGH (ref 0.5–1.8)
O2 SATURATION VENOUS: 78 % (ref 40.0–85.0)
O2 SATURATION VENOUS: 81.5 % (ref 40.0–85.0)
PCO2 VENOUS: 33 mmHg — ABNORMAL LOW (ref 40–60)
PCO2 VENOUS: 32 mmHg — ABNORMAL LOW (ref 40–60)
PH VENOUS: 7.35 (ref 7.32–7.43)
PH VENOUS: 7.34 (ref 7.32–7.43)
PO2 VENOUS: 45 mmHg (ref 30–55)
PO2 VENOUS: 46 mmHg (ref 30–55)
POTASSIUM WHOLE BLOOD: 3.7 mmol/L (ref 3.4–4.6)
POTASSIUM WHOLE BLOOD: 3.8 mmol/L (ref 3.4–4.6)
SODIUM WHOLE BLOOD: 139 mmol/L (ref 135–145)
SODIUM WHOLE BLOOD: 139 mmol/L (ref 135–145)

## 2023-01-28 LAB — BASIC METABOLIC PANEL
ANION GAP: 6 mmol/L (ref 5–14)
BLOOD UREA NITROGEN: 43 mg/dL — ABNORMAL HIGH (ref 9–23)
BUN / CREAT RATIO: 22
CALCIUM: 8.3 mg/dL — ABNORMAL LOW (ref 8.7–10.4)
CHLORIDE: 115 mmol/L — ABNORMAL HIGH (ref 98–107)
CO2: 20 mmol/L (ref 20.0–31.0)
CREATININE: 1.97 mg/dL — ABNORMAL HIGH
EGFR CKD-EPI (2021) FEMALE: 26 mL/min/{1.73_m2} — ABNORMAL LOW (ref >=60)
GLUCOSE RANDOM: 160 mg/dL (ref 70–179)
POTASSIUM: 4 mmol/L (ref 3.4–4.8)
SODIUM: 141 mmol/L (ref 135–145)

## 2023-01-28 LAB — TACROLIMUS LEVEL, TROUGH
TACROLIMUS, TROUGH: 2.1 ng/mL — ABNORMAL LOW (ref 5.0–15.0)
TACROLIMUS, TROUGH: 2.1 ng/mL — ABNORMAL LOW (ref 5.0–15.0)

## 2023-01-28 LAB — TRIGLYCERIDES: TRIGLYCERIDES: 231 mg/dL — ABNORMAL HIGH (ref 0–150)

## 2023-01-28 LAB — VANCOMYCIN, TROUGH: VANCOMYCIN TROUGH: 13.7 ug/mL (ref 10.0–20.0)

## 2023-01-28 LAB — MAGNESIUM: MAGNESIUM: 1.4 mg/dL — ABNORMAL LOW (ref 1.6–2.6)

## 2023-01-28 LAB — IGG: GAMMAGLOBULIN; IGG: 647 mg/dL — ABNORMAL LOW (ref 650–1600)

## 2023-01-28 MED ADMIN — meropenem (MERREM) 500 mg in sodium chloride 0.9 % (NS) 100 mL IVPB-MBP: 500 mg | INTRAVENOUS | @ 04:00:00 | Stop: 2023-02-10

## 2023-01-28 MED ADMIN — NORepinephrine bitartrate-D5W 8 mg/250 mL (32 mcg/mL) infusion: INTRAVENOUS | @ 08:00:00 | Stop: 2023-01-28

## 2023-01-28 MED ADMIN — tacrolimus (PROGRAF) capsule 1 mg: 1 mg | ORAL | @ 15:00:00

## 2023-01-28 MED ADMIN — etomidate (AMIDATE) injection 15 mg: 15 mg | INTRAVENOUS | @ 01:00:00 | Stop: 2023-01-27

## 2023-01-28 MED ADMIN — lactated ringers bolus 1,000 mL: 1000 mL | INTRAVENOUS | @ 07:00:00 | Stop: 2023-01-28

## 2023-01-28 MED ADMIN — propofol (DIPRIVAN) infusion 10 mg/mL: 50 ug/kg/min | INTRAVENOUS | @ 01:00:00

## 2023-01-28 MED ADMIN — acetaminophen (TYLENOL) tablet 1,000 mg: 1000 mg | ORAL | @ 06:00:00

## 2023-01-28 MED ADMIN — mycophenolate (CELLCEPT) capsule 250 mg: 250 mg | ORAL | @ 12:00:00

## 2023-01-28 MED ADMIN — lactated ringers bolus 1,000 mL: 1000 mL | INTRAVENOUS | @ 10:00:00 | Stop: 2023-01-28

## 2023-01-28 MED ADMIN — meropenem (MERREM) 500 mg in sodium chloride 0.9 % (NS) 100 mL IVPB-MBP: 500 mg | INTRAVENOUS | @ 12:00:00 | Stop: 2023-02-10

## 2023-01-28 MED ADMIN — fentaNYL (PF) (SUBLIMAZE) injection 25 mcg: 25 ug | INTRAVENOUS | @ 04:00:00 | Stop: 2023-01-28

## 2023-01-28 MED ADMIN — lactated ringers bolus 1,000 mL: 1000 mL | INTRAVENOUS | @ 04:00:00 | Stop: 2023-01-28

## 2023-01-28 MED ADMIN — vancomycin (VANCOCIN) IVPB 1000 mg (premix): 1000 mg | INTRAVENOUS | @ 11:00:00 | Stop: 2023-01-28

## 2023-01-28 MED ADMIN — atorvastatin (LIPITOR) tablet 10 mg: 10 mg | ORAL | @ 12:00:00

## 2023-01-28 MED ADMIN — propofol (DIPRIVAN) infusion 10 mg/mL: 0-50 ug/kg/min | INTRAVENOUS | @ 04:00:00 | Stop: 2023-01-28

## 2023-01-28 MED ADMIN — insulin lispro (HumaLOG) injection 0-20 Units: 0-20 [IU] | SUBCUTANEOUS | @ 12:00:00

## 2023-01-28 MED ADMIN — haloperidol LACTATE (HALDOL) injection 5 mg: 5 mg | INTRAVENOUS | @ 06:00:00 | Stop: 2023-01-28

## 2023-01-28 MED ADMIN — hydrocortisone sod succ (Solu-CORTEF) injection 20 mg: 20 mg | INTRAVENOUS | @ 04:00:00 | Stop: 2023-01-28

## 2023-01-28 MED ADMIN — ROCuronium (ZEMURON) injection 50 mg: 50 mg | INTRAVENOUS | @ 01:00:00 | Stop: 2023-01-27

## 2023-01-28 MED ADMIN — hydrocortisone sod succ (Solu-CORTEF) injection 40 mg: 40 mg | INTRAVENOUS | @ 12:00:00 | Stop: 2023-01-28

## 2023-01-28 MED ADMIN — tacrolimus (PROGRAF) capsule 0.5 mg: .5 mg | ORAL | @ 05:00:00

## 2023-01-28 MED ADMIN — NORepinephrine 8 mg in dextrose 5 % 250 mL (32 mcg/mL) infusion PMB: 0-30 ug/min | INTRAVENOUS | @ 08:00:00 | Stop: 2023-01-28

## 2023-01-28 MED ADMIN — levothyroxine (SYNTHROID) tablet 112 mcg: 112 ug | ORAL | @ 10:00:00

## 2023-01-28 NOTE — Unmapped (Signed)
MICU History & Physical     Date of Service: 01/27/2023    Problem List:   Active Problems:    Type 2 diabetes mellitus with diabetic nephropathy, with long-term current use of insulin (CMS-HCC)    End-stage renal disease (CMS-HCC)    Kidney transplant recipient    Anemia due to stage 5 chronic kidney disease, not on chronic dialysis (CMS-HCC)    Immunosuppressed status (CMS-HCC)    Hypertension    Hyperlipidemia    Chronic idiopathic thrombocytopenia (CMS-HCC)  Resolved Problems:    * No resolved hospital problems. *      HPI: Julie [name] is a 72 y.o. female with s/p DDKT 08/06/22 on cellcept, tac, and prednisone, HTN, HLD, T2DM, AoCD, thrombocytopenia, recent hx of candida esophagitis presenting with chills and 102.71F fever, complicated by emesis and acute mental status change with c/f airway protection requiring intubation.    Has had 5 admissions this year with similar presentation of AMS and fever found with ESBL E.coli bacteremia 2/2 MDR UTI. Prior admissions include January (1/14-1/28; treated with IV levofloxacin), and February (1/29-2/5), March (3/14-4/3) April (4/13-4/23) and June (6/3-6/8), all treated with IV meropenem. No identifiable source on prior admission, have treated for presumed pyelonephritis. Of note, perinephric fluid collection noted on 3/14 with IR guided drainage 3/19 with neg culture. Re-evaluation by IR on 4/16 who determined not amenable for intervention. She is on UTI prophylaxis of d-mannose, estradiol cream 2g nightly, magnesium oxide 133g BID, fosfomycin 3 g every 3 days. Outpatient UroGyn evaluation with no structural issues. She presents similarly to prior admissions with n/v and fevers.     Hx of recurrent MDR, ESBL bacteremia:     24hr events:   - Transitioned to IV meropenem from vanc and cef in ED  - Tmax 102.7 ON  - 3x emesis in the ED with sudden acute change in mental status, prompting intubation for airway protection  -on propofol drip with RASS goal 0 to -1    Neurological   AMS - Acute metabolic encephalopathy w/ fever  Known history of AMS with fevers, see prior admission notes (Jan, Feb, March, Apr, June). Currently sedated on propofol.   - Neuro exam when off sedation    Sedation  Intubated d/t concern of airway protection, see Pulm below. Received rocuronium during intubation and received propofol after intubation.  - Propofol drip, titrating to RASS goal  - RASS goal 0 to -1    Analgesia: No pain issues  RASS at goal? Yes  [place] Agitation Assessment Scale (RASS) : 0 (12/29/2022  9:05 PM)       Pulmonary   Intubated - C/f airway protection  Intubated in ED due to concerns of airway protection related to emesis x3 and sudden onset AMS ~1900 01/27/23. Patient was not hypoxemic or hypercarbic with appropriate pCO2 of 43 on VBG and SpO2 >90. Per ED patient was briefly tachypnic, though improved with propofol and has had normal RR since. She has not required pressors, with low concern for septic shock. Currently on PRVC.   -q4 VBG   sedation as above  - likely appropriate for SBT/SAT in am       S RR:  [16-20] 20  FiO2 (%):  [40 %-50 %] 40 %  S VT:  [350 mL] 350 mL  O2 Device: Ventilator    Cardiovascular   Hypertension  Elevated BP since admission with MAP in 80s. Had received 1L LR in ED for sepsis protocol. Holding home  meds below in setting of acute illness and concern for bacteremia.  - HOLD home spironolactone 25mg  daily  - HOLD home chlorthalidone 25mg  daily  - HOLD home carvedilol 25 mg daily  - HOLD home amlodipine 5mg  BID, resume if persistently hypertensive per neprho  - HOLD home hydralazine 50mg  BID, resume if persistently hypertensive per neprho    HLD: continue home atorvastatin 10 mg daily       Renal   S/p L DDKT 08/06/22 2/2 diabetic nephropathy   Follows with Dr. Elvera Maria. Lowest Cr ~0.95 after DDKT, presumed new baseline between 0.9-1.2. Cr 1.3 on 7/6 admission, close to baseline with anticipated lab margin of error.   - Transplant renal US  - Strict I/Os via Foley  - BMP, Phos, Mag  - Tacrolimus 1g AM and 0.5mg  PM   - Tac trough MWF, goal 5-7   - Continue home prednisone 5mg  daily.   - Reduce cellcept 250mg  BID in setting of infection of unknown origin (home cellcept 500mg  BID)   - HOLD alendronate 70mg  weekly while intubated  - HOLD Vit D daily while intubated  - Transplant nephrology following    Infectious Disease/Autoimmune   Fever in IC host - Lactic Acidosis - C/f bacteremia   Per husband, patient had max temp of 102 at home around 2pm. No fever documented since presentation to ED. Has had 5 admissions this year with similar presentation of AMS and fever found with ESBL E.coli bacteremia 2/2 MDR UTI, see above for further hx. Most recent course of ertapenem completed 01/15/23 for presumed graft pyelonephritis in absence of clear infectious source. Received vancomycin and cefepime in ED. Transitioned to IV meropenem based on prior susceptibilities on arrival to MICU. Pursued comprehensive infectious workup for IC patient with fever. Obtaining CTAP noncon to evaluate for infectious source. WBC elevated at 11.5 and 102.59F. UA remarkable for leukocyte esterase, nitrites and protein concerning for UTI. Notably lactate 3.1 increased from 2.0 within 2 hours, concerning for bacteremia.  Received 1L LR in ED for sepsis protocol, receiving add'l liter in MICU.  - 1L fluid bolus for sepsis protocol  - IV meropenem (7/6- )  - Discontinued vancomycin and cefepime  - Daily CBC/ diff  [ ]  Consult ICID in AM  [ ]  F/up UCx, Bcx  [ ]  F/up CT noncon   - Prophlyaxis:  - Continue TMP+SMX renally dosed equivalent 400/80 MWF x 6 months  - Has completed valcyte for 3 months      Cultures:  No results found for: BLOOD CULTURE, BLOOD CULTURE, ROUTINE, URINE CULTURE, COMPREHENSIVE, URINE CULTURE, COMPREHENSIVE, LOWER RESPIRATORY CULTURE  WBC   Date Value   01/27/2023 11.5 10*9/L (H)   01/22/2023 6.3   01/16/2023 3.4 x10E3/uL     WBC Clumps (/HPF)   Date Value 01/27/2023 Few (A)     WBC, UA (/HPF)   Date Value   01/27/2023 >182 (H)          Sepsis Protocol Documentation      FEN/GI   GERD  Takes home pantoprazole.  - Continue pantoprazole 40mg  daily    Chronic constipation  Home bowel regimen senna daily and colace 100mg  PRN.  - Continue daily senna  - Colace PRN     Provider Malnutrition Assessment:  Body mass index is 23.44 kg/m??.  GLIM criteria:   Pt does not meet criteria  -I have screened this patient for malnutrition and they did NOT meet criteria for malnutrition based on GLIM criteria.  -  Nutrition consulted no  RD assessment:Not done yet.           Heme/Coag   Acute on Chronic Thrombocytopenia  Known chronic thrombocytopenia ~110-130, presented with platelets of 73. Of note, patient has had similar drop in platelets during previous admissions for bacteremia with resolution back to baseline. Suspect drop is in response to acute illness. Blood smear during prior admission in June indicative of pseudothrombocytopenia, unrevealing for pathological process, obtaining repeat this admission.  - Blood smear  - Trend Plt on daily CBC    Anemia on CKD  Baseline Hgb ~10. Presenting Hgb 10.3. Iron panel and ferritin at prior June admission consistent with AoCD. If acute drop, will repeat anemia workup.  - Trend Hgb on daily CBC    Endocrine   T2DM, insulin-dependent  BG 200s on admission. Most recent 12/07/22 A1C 5.8. Per patient's husband, patient does not take meal time insulin appropriately, often missing doses or taking it after meals despite him reminding her. Their meals are often also unpredictable and patient's husband endorses missing doses. Will require further insulin titration.  - SSI    Hypothyroidism: Continue home synthroid 112 mcg daily    Integumentary   #  - WOCN consulted for high risk skin assessment No. Reason: no known .  - WOCN recs >> pending, OR agree with assmt and plan  - cont pressure mitigating precautions per skin policy    Prophylaxis/LDA/Restraints/Consults   ICU Checklist completed: yes (see ICU rounding navigator in Epic)   Patient Lines/Drains/Airways Status       Active Active Lines, Drains, & Airways       Name Placement date Placement time Site Days    ETT  7 01/27/23  2035  -- less than 1    Urethral Catheter Temperature probe 16 Fr. 01/27/23  2031  Temperature probe  less than 1    Peripheral IV 01/27/23 Anterior;Proximal;Right Antecubital 01/27/23  1555  Antecubital  less than 1    Peripheral IV 01/27/23 Anterior;Right;Upper Arm 01/27/23  2027  Arm  less than 1    Arteriovenous Fistula - Vein Graft  Access 08/24/16 Left;Upper Arm 08/24/16  --  Arm  2347                  Patient Lines/Drains/Airways Status       Active Wounds       Name Placement date Placement time Site Days    Surgical Site 10/13/22 Abdomen 10/13/22  1525  -- 106                    Goals of Care     Code Status: Full Code    Designated Healthcare Decision Maker:  Ms. Kuklinski current decisional capacity for healthcare decision-making is Full capacity. Her designated Educational psychologist) is/are   HCDM (patient stated preference) (Active): Cadmus,[name] - Spouse - [phone].      Subjective     HPI:  Bg Zinn is a 72 y.o. female with past medical history significant for s/p DDKT 08/06/22 on immunosuppression, HTN, HLD, T2DM, AoCD, thrombocytopenia, recent hx of candida esophagitis  here with emesis, AMS and Fever 102.24F who required intubation d/t c/f inability to protect airway.    Patient recently finished 21 day course of antibiotics for MDR bacteremia on 01/15/23. Patient was feeling well yesterday 01/26/23 and had her powerline removed with IR. On 7/6 morning she was acting out of her norm (waking up late, having chills, taking  in her sleep) and her husband became concerned she may be ill. He took her temperature at 9am. Repeat temperature 2 hours later was 101F prompting him to call the transplant coordinator who confirmed with IR this was not a post-procedure fever. Husband then brought patient to ED after repeat temperature 101F at 1pm, at which point patient experienced 1x emesis. Patient was initially alert and mobile in the ED, going to the bathroom indepenently. Around 1900, patient had an acute change in mental status alongside episodes of emesis with concern for poor airway protection prompting intubation. She received rocuronium and etomidate in the ED. Sepsis protocol was initiated with patient receiving 1L LR.     ROS: Ten-point review of systems is obtained and is negative except as noted in HPI.    Allergies  Allergies   Allergen Reactions    Gabapentin Other (See Comments)     Shakes    Valacyclovir Nausea Only and Other (See Comments)       Meds  No current facility-administered medications on file prior to encounter.     Current Outpatient Medications on File Prior to Encounter   Medication Sig    alendronate (FOSAMAX) 70 MG tablet Take 1 tablet (70 mg total) by mouth every seven (7) days.    amlodipine (NORVASC) 5 MG tablet Take 1 tablet (5 mg total) by mouth every twelve (12) hours.    atorvastatin (LIPITOR) 10 MG tablet Take 1 tablet (10 mg total) by mouth daily.    blood sugar diagnostic (ACCU-CHEK AVIVA PLUS TEST STRP) Strp USE 1 STRIP TO CHECK GLUCOSE THREE TIMES DAILY    blood sugar diagnostic (GLUCOSE BLOOD) Strp Use to check blood sugar as directed with insulin 3 times a day & for symptoms of high or low blood sugar.    blood sugar diagnostic Strp USE 1 STRIP TO CHECK GLUCOSE THREE TIMES DAILY    blood-glucose meter kit Use as directed. ACCU-CHEK AVIVA    blood-glucose meter kit Use as instructed    blood-glucose sensor (DEXCOM G7 SENSOR) Devi Use 1 sensor every 10 days.    carvedilol (COREG) 25 MG tablet Take 1 tablet (25 mg total) by mouth two (2) times a day.    chlorthalidone (HYGROTON) 25 MG tablet Take 1 tablet (25 mg total) by mouth daily.    cholecalciferol, vitamin D3 25 mcg, 1,000 units,, (CHOLECALCIFEROL-25 MCG, 1,000 UNIT,) 1,000 unit (25 mcg) tablet Take 2 tablets (50 mcg total) by mouth daily.    CRANBERRY ORAL Take 2,000 mg by mouth two (2) times a day.    d-mannose Powd Take 1 tablet by mouth daily.    docusate sodium (COLACE) 100 MG capsule Take 1 capsule (100 mg total) by mouth two (2) times a day as needed for constipation.    ertapenem 500 mg, OVERFILL 10 mL in sodium chloride 0.9 % 100 mL IVPB Infuse 500 mg into a venous catheter daily.    estradiol (ESTRACE) 0.01 % (0.1 mg/gram) vaginal cream Insert 2 g into the vagina nightly.    fosfomycin (MONUROL) 3 gram Pack Take 3 g by mouth every three (3) days. Take fosfomycin 3g orally every 3-4 days for UTI prophylaxis    hydrALAZINE (APRESOLINE) 50 MG tablet Take 1 tablet (50 mg total) by mouth two (2) times a day.    insulin aspart (NOVOLOG FLEXPEN SUBQ) Inject 0-8 Units under the skin Three (3) times a day before meals. *Max daily dose: 33 units*    insulin glargine (BASAGLAR, LANTUS)  100 unit/mL (3 mL) injection pen Inject 0.03 mL (3 Units total) under the skin nightly.    lancets Misc Use to check blood sugar as directed with insulin 3 times a day & for symptoms of high or low blood sugar.    levothyroxine (SYNTHROID) 112 MCG tablet Take 1 tablet (112 mcg total) by mouth daily.    magnesium oxide-Mg AA chelate (MAGNESIUM PLUS PROTEIN) 133 mg Take 1 tablet by mouth two (2) times a day.    mycophenolate (CELLCEPT) 250 mg capsule Take 2 capsules (500 mg total) by mouth two (2) times a day.    pantoprazole (PROTONIX) 40 MG tablet Take 1 tablet (40 mg total) by mouth daily.    pen needle, diabetic 32 gauge x 5/32 (4 mm) Ndle Use with insulin up to 4 times/day as needed.    pen needle, diabetic 32 gauge x 5/32 Ndle Use with insulin 2 times daily    predniSONE (DELTASONE) 5 MG tablet Take 1 tablet (5 mg total) by mouth in the morning.    SENNA 8.6 mg tablet Take 1 tablet by mouth daily as needed for constipation.    sodium bicarbonate 650 mg tablet Take 1 tablet (650 mg total) by mouth Three (3) times a day.    spironolactone (ALDACTONE) 25 MG tablet Take 1 tablet (25 mg total) by mouth daily.    sulfamethoxazole-trimethoprim (BACTRIM) 400-80 mg per tablet Take 1 tablet (80 mg of trimethoprim total) by mouth Every Monday, Wednesday, and Friday.    tacrolimus (PROGRAF) 0.5 MG capsule Take 2 capsules (1 mg total) by mouth daily AND 2 capsules (1 mg total) nightly.       Past Medical History  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)     since July 2017    Hypertension     Hypothyroidism (acquired)     Kidney transplant status, cadaveric 08/08/2022    Type 2 diabetes mellitus (CMS-HCC)        Past Surgical History  Past Surgical History:   Procedure Laterality Date    CATARACT EXTRACTION      HYSTERECTOMY      OOPHORECTOMY      PERITONEAL CATHETER INSERTION      PR LAP INSERTION TUNNELED INTRAPERITONEAL CATHETER N/A 09/27/2018    Procedure: LAPAROSCOPY, SURGICAL; WITH INSERTION OF INTRAPERITONEAL CANNULA OR CATHETER, PERMANENT;  Surgeon: Leona Carry, MD;  Location: MAIN OR [place];  Service: Transplant    PR LAP INSERTION TUNNELED INTRAPERITONEAL CATHETER N/A 04/08/2019    Procedure: LAPAROSCOPY, SURGICAL; WITH INSERTION OF INTRAPERITONEAL CANNULA OR CATHETER, PERMANENT;  Surgeon: Leona Carry, MD;  Location: MAIN OR [facility];  Service: Transplant    PR LAP REVISE INTRAPERITONEAL CATHETER N/A 08/26/2019    Procedure: LAPAROSCOPY, SURGICAL; W/REVIS PREV PLACED INTRAPERITONEAL CANNULA/CATH, REMOV INTRALUMIN OBSTRUCT MATERIAL;  Surgeon: Leona Carry, MD;  Location: MAIN OR [facility];  Service: Transplant    PR REDUCE VOLVULUS,INTUSS,INTERN HERNIA N/A 10/13/2022    Procedure: REDUCTION OF VOLVULUS, INTUSSUSCEPTION, INTERNAL HERNIA, BY LAPAROTOMY;  Surgeon: Suella Broad, MD;  Location: MAIN OR [place];  Service: Trauma    PR REMOVAL TUNNELED INTRAPERITONEAL CATHETER N/A 05/06/2018    Procedure: REMOVAL OF PERMANENT INTRAPERITONEAL CANNULA OR CATHETER;  Surgeon: Leona Carry, MD;  Location: MAIN OR [facility];  Service: Transplant    PR REMOVE  PERITONEAL FOREIGN BODY N/A 04/08/2019    Procedure: Removal Of Peritoneal Of Foreign Body From Peritoneal Cavity;  Surgeon: Leona Carry, MD;  Location: MAIN OR [facility];  Service: Transplant    PR TRANSPLANT,PREP RENAL GRAFT/ARTERIAL Right 08/06/2022    Procedure: [facility] RECONSTRUCTION CADAVER/LIVING DONOR RENAL ALLOGRAFT PRIOR TO TRANSPLANT; ARTERIAL ANASTOMOSIS EAC;  Surgeon: Toledo, Lilyan Punt, MD;  Location: MAIN OR [facility];  Service: Transplant    PR TRANSPLANTATION OF KIDNEY Right 08/06/2022    Procedure: RENAL ALLOTRANSPLANTATION, IMPLANTATION OF GRAFT; WITHOUT RECIPIENT NEPHRECTOMY;  Surgeon: Toledo, Lilyan Punt, MD;  Location: MAIN OR [facility];  Service: Transplant    TUBAL LIGATION         Family History  Family History   Problem Relation Age of Onset    Diabetes Mother     Alzheimer's disease Father     Diabetes Sister     Diabetes Sister     Diabetes Sister     Anesthesia problems Neg Hx     Bleeding Disorder Neg Hx        Social History  Social History     Socioeconomic History    Marital status: Married     Spouse name: Amna Michelin    Number of children: 2   Occupational History    Occupation: retired   Tobacco Use    Smoking status: Former     Current packs/day: 0.00     Average packs/day: 0.8 packs/day for 30.0 years (22.5 ttl pk-yrs)     Types: Cigarettes     Start date: 06/06/1976     Quit date: 06/06/2006     Years since quitting: 16.6    Smokeless tobacco: Former   Substance and Sexual Activity    Alcohol use: Yes     Comment: only special occasions    Drug use: No   Social History Narrative    Lives in [place] with her husband. Moved from [place] to the [place] when she was around 19.     Social Determinants of Health Financial Resource Strain: Low Risk  (11/17/2022)    Received from [facility], [facility]    Overall Financial Resource Strain (CARDIA)     Difficulty of Paying Living Expenses: Not hard at all   Food Insecurity: No Food Insecurity (11/17/2022)    Received from [facility], [facility]    Hunger Vital Sign     Worried About Running Out of Food in the Last Year: Never true     Ran Out of Food in the Last Year: Never true   Transportation Needs: No Transportation Needs (11/17/2022)    Received from [facility], [facility]    [facility] - Transportation     In the past 12 months, has lack of transportation kept you from medical appointments or from getting medications?: No     Lack of Transportation (Non-Medical): No   Social Connections: Unknown (04/06/2018)    Received from [facility], [facility]    Social Connection and Isolation Panel [NHANES]     Marital Status: Married           Objective     Vitals - past 24 hours  Temp:  [37.4 ??C (99.3 ??F)] 37.4 ??C (99.3 ??F)  Heart Rate:  [83-109] 86  SpO2 Pulse:  [86-107] 86  Resp:  [13-35] 20  BP: (91-170)/(23-130) 170/23  FiO2 (%):  [40 %-50 %] 40 %  SpO2:  [91 %-100 %] 100 % Intake/Output  No intake/output data recorded.     Physical Exam:    General: Ill appearing patient, intubated  HEENT: Atraumatic, normocephalic  CV: RRR  Pulm: breath sounds clear bilaterally  GI: normal bowel sounds   Skin: No skin abrasions  Neuro: sedated    The patient's hospital stay has been complicated by the following clinically significant conditions requiring additional evaluation and treatment or having a significant effect of this patient's care: - Anemia POA requiring further investigation or monitoring  - Chronic kidney disease POA requiring further investigation, treatment, or monitoring    Body mass index is 23.44 kg/m??.            Wt Readings from Last 12 Encounters:   01/26/23 54.4 kg (120 lb)   01/18/23 54.4 kg (120 lb)   12/25/22 55.3 kg (121 lb 14.6 oz)   12/07/22 56.7 kg (125 lb)   12/07/22 56.7 kg (125 lb)   11/28/22 55.3 kg (122 lb)   11/24/22 55 kg (121 lb 3.2 oz)   11/20/22 56.1 kg (123 lb 9.6 oz)   11/06/22 56.7 kg (124 lb 14.3 oz)   10/25/22 56.7 kg (124 lb 14.3 oz)   09/28/22 59.7 kg (131 lb 9.6 oz)   09/06/22 60.1 kg (132 lb 6.4 oz)       Continuous Infusions:    propofol 10 mg/mL infusion 20 mcg/kg/min (01/27/23 2048)       Scheduled Medications:    fentaNYL (PF)  50 mcg Intravenous Once    meropenem  500 mg Intravenous Q12H [facility]       PRN medications:  acetaminophen, acetaminophen, aluminum-magnesium hydroxide-simethicone, fentaNYL (PF) **OR** fentaNYL (PF), guaiFENesin, haloperidol LACTATE, melatonin, midazolam, senna    Data/Imaging Review: Reviewed in Epic and personally interpreted on 01/27/2023. See EMR for detailed results.    [name], MS4    I attest that I have reviewed the medical student note and that the components of the history of the present illness, the physical exam, and the assessment and plan documented were performed by me or were performed in my presence by the student where I verified the documentation and performed (or re-performed) the exam and medical decision making.   Trina Ao MD  Internal Medicine PGY1

## 2023-01-28 NOTE — Unmapped (Signed)
MICU Daily Progress Note     Date of Service: 01/28/2023    Problem List:   Active Problems:    Type 2 diabetes mellitus with diabetic nephropathy, with long-term current use of insulin (CMS-HCC)    End-stage renal disease (CMS-HCC)    Kidney transplant recipient    Anemia due to stage 5 chronic kidney disease, not on chronic dialysis (CMS-HCC)    Immunosuppressed status (CMS-HCC)    Hypertension    Hyperlipidemia    Chronic idiopathic thrombocytopenia (CMS-HCC)  Resolved Problems:    * No resolved hospital problems. *      Interval history: Julie [name] is a 72 y.o. 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 C/f sepsis 2/2 UTI, on meropenem.      24hr events:   - Afebrile, nontachycardic. On NE 2 iso propofol 20  - Continues on meropenem, dec WBC and lactate  - Passed SBT  - S/p 4L LR    Neurological   AMS - Acute metabolic encephalopathy w/ fever  Known history of AMS with fevers, see prior admission notes (Jan, Feb, March, Apr, June). Currently sedated on propofol.   - Neuro exam when off sedation     Sedation  Intubated d/t concern of airway protection, see Pulm below. Received rocuronium during intubation and received propofol after intubation. Propofol most recently 20.  - Propofol drip, wean prior to extubation    Analgesia: No pain issues  RASS at goal? No - adjusting sedation or order to reflect need  [place] Agitation Assessment Scale (RASS) : +1 (01/28/2023  6:28 AM)       Pulmonary   Intubated - C/f airway protection  Intubated in ED due to concerns of airway protection related to emesis x3 and sudden onset AMS. Passed SBT. Plan to extubate today.   - q4 VBG   - sedation as above  - Plan to extubate to [place]    S RR:  [12-20] 12  FiO2 (%):  [40 %-50 %] 40 %  S VT:  [300 mL-350 mL] 300 mL  PR SUP:  [5 cm H20-10 cm H20] 10 cm H20  O2 Device: Ventilator    Cardiovascular   Hypertension  Elevated BP since admission with MAP in 80s. Holding home meds below in setting of acute illness and concern for bacteremia.  - HOLD home spironolactone 25mg  daily  - HOLD home chlorthalidone 25mg  daily  - HOLD home carvedilol 25 mg daily  - HOLD home amlodipine 5mg  BID, resume if persistently hypertensive per neprho  - HOLD home hydralazine 50mg  BID, resume if persistently hypertensive per neprho    HLD: continue home atorvastatin 10 mg daily     Renal   S/p L DDKT 08/06/22 2/2 diabetic nephropathy   Follows with Dr. Elvera Maria. Lowest Cr ~0.95 after DDKT, presumed new baseline between 0.9-1.2. Cr 1.3 on 7/6 admission, now uptrended to 1.92 with minimal UOP. Transplant renal US 7/7 with dec peak systolic velocity of the main renal artery at the anastomosis with overall decreased resistive indices when compared to 12/25/2022. Thin, heterogeneous collection lateral to the transplant kidney, may reflect residua of the perinephric collection noted on prior studies.   - Strict I/Os via Foley  - BMP, Phos, Mag  - Tacrolimus 1g AM and 0.5mg  PM   - Tac trough MWF,  goal 5-7   - Continue home prednisone 5mg  daily.   - Reduce cellcept 250mg  BID in setting of infection of unknown origin (home cellcept 500mg  BID)   - HOLD alendronate 70mg  weekly while intubated  - HOLD Vit D daily while intubated  - Transplant nephrology following    Infectious Disease/Autoimmune   Fever in IC host - Lactic Acidosis - C/f bacteremia   Presented febrile. Has had 5 admissions this year with similar presentation of AMS and fever found with ESBL E.coli bacteremia 2/2 MDR UTI, see above for further hx. Most recent course of ertapenem completed 01/15/23 for presumed graft pyelonephritis in absence of clear infectious source. Received vancomycin and cefepime in ED. Transitioned to IV meropenem based on prior susceptibilities on arrival to MICU. Pursued comprehensive infectious workup for IC patient with fever.     Febrile with WBC elevated at 11.5 and lactate to 3.1. CXR unremark, UA concerning for UTI. Now afebrile, nontachycardic with dec WBC to 9.6 and lactate to 2. On NE of 2 most likely 2/2 propofol. S/p 4L LR. CTAP with stable to mildly decreased size of the crescentic fluid collection within the overlying soft tissues measuring up to 7.4 cm in size lateral to transplant kidney. Limited evaluation for abscess given absence of IV contrast.   - Cont IV meropenem (7/6 - )   - Discontinued vanc  - Daily CBC/ diff  - ICID c/s  - F/up UCx, Bcx    - Prophlyaxis:  - Continue TMP+SMX renally dosed equivalent 400/80 MWF x 6 months  - Has completed valcyte for 3 months    Cultures:  No results found for: BLOOD CULTURE, BLOOD CULTURE, ROUTINE, URINE CULTURE, COMPREHENSIVE, URINE CULTURE, COMPREHENSIVE, LOWER RESPIRATORY CULTURE  WBC   Date Value   01/28/2023 9.6 10*9/L   01/22/2023 6.3   01/16/2023 3.4 x10E3/uL     WBC Clumps (/HPF)   Date Value   01/27/2023 Few (A)     WBC, UA (/HPF)   Date Value   01/27/2023 >182 (H)          FEN/GI   GERD  - Continue home pantoprazole 40mg  daily     Chronic constipation  Home bowel regimen senna daily and colace 100mg  PRN.  - Continue daily senna  - Colace PRN    Provider Malnutrition Assessment:  Body mass index is 23.44 kg/m??.           Heme/Coag   Acute on Chronic Thrombocytopenia  Known chronic thrombocytopenia ~110-130, presented with platelets of 73. Of note, patient has had similar drop in platelets during previous admissions for bacteremia with resolution back to baseline. Suspect drop is in response to acute illness. Plts stable at 60.   - F/up Blood smear  - Trend Plt on daily CBC     Anemia on CKD  Baseline Hgb ~10. Presenting Hgb 10.3. Iron panel and ferritin at prior June admission consistent with AoCD. If acute drop, will repeat anemia workup. Hgb dec to 8.3, most likely dilutional iso 4L LR.  - Trend Hgb on daily CBC    Endocrine   T2DM, insulin-dependent  Most recent 12/07/22 A1C 5.8. BG 150-180. Will cont to trend and titrate regimen as needed.  - SSI    Hypothyroidism: Continue home synthroid 112 mcg daily    Integumentary   NAI    Prophylaxis/LDA/Restraints/Consults   ICU Checklist completed: yes (see ICU rounding navigator in Epic)    Patient Lines/Drains/Airways Status  Active Active Lines, Drains, & Airways       Name Placement date Placement time Site Days    ETT  7 01/27/23  2035  -- less than 1    CVC Triple Lumen 01/28/23 Non-tunneled Left Internal jugular 01/28/23  0016  Internal jugular  less than 1    NG/OG Tube Feedings Center mouth 01/27/23  2349  Center mouth  less than 1    Urethral Catheter Temperature probe 16 Fr. 01/27/23  2031  Temperature probe  less than 1    Peripheral IV 01/27/23 Anterior;Proximal;Right Antecubital 01/27/23  1555  Antecubital  less than 1    Arteriovenous Fistula - Vein Graft  Access 08/24/16 Left;Upper Arm 08/24/16  --  Arm  2348                  Patient Lines/Drains/Airways Status       Active Wounds       None                    Goals of Care     Code Status: Full Code    Designated Healthcare Decision Maker:  Ms. Stiegler current decisional capacity for healthcare decision-making is Full capacity. Her designated Educational psychologist) is/are   HCDM (patient stated preference) (Active): Baris,[name] - Spouse - [phone].      Subjective     Sedated    Objective     Vitals - past 24 hours  Temp:  [37.4 ??C (99.3 ??F)-37.7 ??C (99.9 ??F)] 37.7 ??C (99.9 ??F)  Heart Rate:  [65-109] 72  SpO2 Pulse:  [65-107] 71  Resp:  [13-35] 21  BP: (80-192)/(18-130) 147/24  FiO2 (%):  [40 %-50 %] 40 %  SpO2:  [91 %-100 %] 100 % Intake/Output  No intake/output data recorded.     Physical Exam:    General: ill-appearing, intubated   HEENT: atraumatic, normocephalic   CV: RRR  Pulm: CTAB  GI: soft, NTND  Neuro: sedated      Continuous Infusions:    NORepinephrine bitartrate-NS 2 mcg/min (01/28/23 0544)    propofol 10 mg/mL infusion 20 mcg/kg/min (01/28/23 0628)       Scheduled Medications:    atorvastatin  10 mg Oral Daily    hydrocortisone sod succ  20 mg Intravenous Daily    hydrocortisone sod succ  40 mg Intravenous Daily    insulin lispro  0-20 Units Subcutaneous ACHS    levothyroxine  112 mcg Oral daily    meropenem  500 mg Intravenous Q12H [facility]    mycophenolate  250 mg Oral BID    tacrolimus  0.5 mg Oral Nightly    tacrolimus  1 mg Oral Daily    vancomycin  1,000 mg Intravenous Q12H       PRN medications:  acetaminophen, acetaminophen, aluminum-magnesium hydroxide-simethicone, dextrose in water, fentaNYL (PF) **OR** fentaNYL (PF), glucagon, glucose, guaiFENesin, haloperidol LACTATE, melatonin, ondansetron, senna    Data/Imaging Review: Reviewed in Epic and personally interpreted on 01/28/2023. See EMR for detailed results.

## 2023-01-28 NOTE — Unmapped (Signed)
ED Progress Note    Received sign out from previous provider.    Patient Summary: Julie [name] is a 72 y.o. female who presented with vomiting and fever after having a port removed yesterday.  Sepsis protocol initiated.  On re-evaluation, patient was not at baseline. Husband reports she started acting differently about 1 hour ago. Patient was not responding to verbal stimuli and was not following commands.  Patient was brought to CT scanner with a negative head CT.  Patient began vomiting and was not protecting airway.  Patient was intubated and admitted to medical ICU for further workup of new onset encephalopathy.  Action List:   Admitted to medical ICU    Updates  ED Course as of 01/27/23 2201   Sat Jan 27, 2023   1919 Received patient at signout at 1900.   1943 Patient re-evaluated at bedside. Patient has stable vital signs, husband is concerned that she is not acting herself. She is awake but not responding to her name.    1958 Pt going to CT scan.

## 2023-01-28 NOTE — Unmapped (Signed)
Tacrolimus Therapeutic Monitoring Pharmacy Note    Julie [name] is a 72 y.o. female continuing tacrolimus.     Indication: Kidney transplant     Date of Transplant:  08/06/22       Prior Dosing Information: Home regimen 1 mg in am and 0.5 mg in evening      Source(s) of information used to determine prior to admission dosing: Fill HIstory or note    Goals:  Therapeutic Drug Levels  Tacrolimus trough goal:  5-7 ng/ml    Additional Clinical Monitoring/Outcomes  Monitor renal function (SCr and urine output) and liver function (LFTs)  Monitor for signs/symptoms of adverse events (e.g., hyperglycemia, hyperkalemia, hypomagnesemia, hypertension, headache, tremor)    Results:   Tacrolimus level:  2.1 ng/mL, drawn 01/28/23 @ 04:22    Pharmacokinetic Considerations and Significant Drug Interactions:  Concurrent hepatotoxic medications: None identified  Concurrent CYP3A4 substrates/inhibitors: None identified  Concurrent nephrotoxic medications: None identified    Assessment/Plan:  Recommendedation(s)  Level is not reflective of true trough. Continuing home dosing is warranted with daily recheck.    Continue current regimen of 1mg  in am and 0.5 mg in evening (home dosing)    Follow-up  Daily tac levels have been ordered .   A pharmacist will continue to monitor and recommend levels as appropriate    Please page service pharmacist with questions/clarifications.    Neldon Labella, PharmD   PGY-2 Critical Care Pharmacy Resident

## 2023-01-28 NOTE — Unmapped (Signed)
Tacrolimus Therapeutic Monitoring Pharmacy Note    Julie [name] is a 72 y.o. female continuing tacrolimus.     Indication: Kidney transplant     Date of Transplant:  08/06/22       Prior Dosing Information: Home regimen 1 mg in am and 0.5 mg in eevening      Source(s) of information used to determine prior to admission dosing: Fill HIstory or note    Goals:  Therapeutic Drug Levels  Tacrolimus trough goal:  5-7 ng/ml    Additional Clinical Monitoring/Outcomes  Monitor renal function (SCr and urine output) and liver function (LFTs)  Monitor for signs/symptoms of adverse events (e.g., hyperglycemia, hyperkalemia, hypomagnesemia, hypertension, headache, tremor)    Results:   Tacrolimus level: Not applicable    Pharmacokinetic Considerations and Significant Drug Interactions:  Concurrent hepatotoxic medications: None identified  Concurrent CYP3A4 substrates/inhibitors: None identified  Concurrent nephrotoxic medications: None identified    Assessment/Plan:  Recommendedation(s)  Continue current regimen of 1mg  in am and 0.5 mg in evening    Follow-up  Next level to be determined by primary team.   A pharmacist will continue to monitor and recommend levels as appropriate    Please page service pharmacist with questions/clarifications.    Deeann Cree, PharmD

## 2023-01-28 NOTE — Unmapped (Signed)
Problem: Adult Inpatient Plan of Care  Goal: Plan of Care Review  Outcome: Ongoing - Unchanged  Goal: Patient-Specific Goal (Individualized)  Outcome: Ongoing - Unchanged  Goal: Absence of Hospital-Acquired Illness or Injury  Outcome: Ongoing - Unchanged  Intervention: Prevent Skin Injury  Recent Flowsheet Documentation  Taken 01/28/2023 0600 by Barbie Banner, RN  Positioning for Skin: Left  Taken 01/28/2023 0400 by Barbie Banner, RN  Positioning for Skin: Right  Taken 01/28/2023 0200 by Barbie Banner, RN  Positioning for Skin: Left  Taken 01/28/2023 0000 by Barbie Banner, RN  Positioning for Skin: Right  Taken 01/27/2023 2200 by Barbie Banner, RN  Positioning for Skin: Left  Taken 01/27/2023 2145 by Barbie Banner, RN  Positioning for Skin: Left  Goal: Optimal Comfort and Wellbeing  Outcome: Ongoing - Unchanged  Goal: Readiness for Transition of Care  Outcome: Ongoing - Unchanged  Goal: Rounds/Family Conference  Outcome: Ongoing - Unchanged     Problem: Fall Injury Risk  Goal: Absence of Fall and Fall-Related Injury  Outcome: Ongoing - Unchanged     Problem: Self-Care Deficit  Goal: Improved Ability to Complete Activities of Daily Living  Outcome: Ongoing - Unchanged     Problem: Non-Violent Restraints  Goal: Patient will remain free of restraint events  Outcome: Ongoing - Unchanged  Goal: Patient will remain free of physical injury  Outcome: Ongoing - Unchanged     Problem: Mechanical Ventilation Invasive  Goal: Effective Communication  Outcome: Ongoing - Unchanged  Goal: Optimal Device Function  Outcome: Ongoing - Unchanged  Intervention: Optimize Device Care and Function  Recent Flowsheet Documentation  Taken 01/28/2023 0400 by Barbie Banner, RN  Oral Care:   mouth swabbed   oral rinse provided   suction provided  Goal: Mechanical Ventilation Liberation  Outcome: Ongoing - Unchanged  Goal: Optimal Nutrition Delivery  Outcome: Ongoing - Unchanged  Goal: Absence of Device-Related Skin and Tissue Injury  Outcome: Ongoing - Unchanged  Goal: Absence of Ventilator-Induced Lung Injury  Outcome: Ongoing - Unchanged  Intervention: Prevent Ventilator-Associated Pneumonia  Recent Flowsheet Documentation  Taken 01/28/2023 0400 by Barbie Banner, RN  Head of Bed [facility]) Positioning: HOB at 30 degrees  Oral Care:   mouth swabbed   oral rinse provided   suction provided

## 2023-01-28 NOTE — Unmapped (Addendum)
Julie [name] is a 72 y.o. 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 C/f sepsis 2/2 UTI, on meropenem.     AMS - Acute metabolic encephalopathy w/ fever  Known history of AMS with fevers, see prior admission notes (Jan, Feb, March, Apr, June).     Intubated - C/f airway protection  Intubated in ED due to concerns of airway protection related to emesis x3 and sudden onset AMS. Extubated 7/7 to [place]. Discharged on room air.     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 Elvera Maria. Lowest Cr ~0.95 after DDKT, presumed new baseline between 0.9-1.2. Cr 1.3 on 7/6 admission, with uptrended to 1.92 and minimal UOP. Transplant renal US 7/7 with dec peak systolic velocity of the main renal artery at the anastomosis with overall decreased resistive indices when compared to 12/25/2022. Thin, heterogeneous collection lateral to the transplant kidney, may reflect residua of the perinephric collection noted on prior studies. Transplant nephrology consulted, continued Tacrolimus 1g AM and 0.5mg  PM, reduced cellcept 250mg  BID in setting of infection of unknown origin (home cellcept 500mg  BID). Continued home prednisone 5mg  daily.      Fever in IC host - Lactic Acidosis - C/f bacteremia   Presented febrile. Has had 5 admissions this year with similar presentation of AMS and fever found with ESBL E.coli bacteremia 2/2 MDR UTI, see above for further hx. Most recent course of ertapenem completed 01/15/23 for presumed graft pyelonephritis in absence of clear infectious source. Received vancomycin and cefepime in ED. Transitioned to IV meropenem based on prior susceptibilities on arrival to MICU. Pursued comprehensive infectious workup for IC patient with fever.      Febrile with WBC elevated at 11.5 and lactate to 3.1. CXR unremark, UA concerning for UTI. Ucx and blood culture was negative. CTAP with stable to mildly decreased size of the crescentic fluid collection within the overlying soft tissues measuring up to 7.4 cm in size lateral to transplant kidney. Cultured with negative studies. Limited evaluation for abscess given absence of IV contrast. VIR c/s and collection not large enough to aspirate. ICID consulted, antibiotics course inpatient as follows: 7/6 vanc/cefepime -> 7/7 Vanc, mero--> 7/8 meropenem--> 7/9 Ertapenem. Transitioned to Ertapenem IV 500mg  daily on 7/14 for a 6-8 weeks course. Final duration of course to be determined at outpatient infectious disease appointment.     - Prophlyaxis:  - Continued TMP+SMX renally dosed equivalent 400/80 MWF to complete 6 months, completed  - Had completed valcyte for 3 months.    Hypertension  Elevated BP since admission with MAP in 80s. Holding home meds below in setting of acute illness and concern for bacteremia. Chlorthalidone was increased to 50mg  daily and hydralazine was increased to 100mg  daily for blood pressure control. Spironolactone was held on admission due to elevated potassium. Blood pressure well controlled on discharge. On discharge patient continued on amlodipine, carvedilol, chlorthalidone, and hydralazine.     Acute on Chronic Thrombocytopenia  Known chronic thrombocytopenia ~110-130, presented with platelets of 73. Of note, patient has had similar drop in platelets during previous admissions for bacteremia with resolution back to baseline. Suspect drop is in response to acute illness. Platelets on discharge 212.     Anemia on CKD  Most likely secondary to anemia of chronic disease and anemia of inflammation. Baseline  Hgb ~10. Iron panel and ferritin at prior June admission consistent with AoCD.    T2DM, insulin-dependent: Most recent 12/07/22 A1C 5.8. BG WNL during hospital stay.  Hypothyroidism: Continue home synthroid 112 mcg daily  HLD: Continue home atorvastatin 10 mg daily

## 2023-01-28 NOTE — Unmapped (Signed)
ED Procedure Note    Intubation    Date/Time: 01/27/2023 9:03 PM    Performed by: Davonna Belling, MD  Authorized by: Emiliano Dyer, DO    Consent:     Consent obtained:  Emergent situation  Pre-procedure details:     NPO status caution: unable to specify NPO status and urgency dictates proceeding with non-ideal NPO status      Patient status:  Altered mental status    Pretreatment meds: etomidate.    Paralytics:  Rocuronium  Procedure details:     Preoxygenation:  Nasal cannula    CPR in progress: no      Intubation method:  Oral    Oral intubation technique:  Video-assisted    Laryngoscope blade:  Mac 3    Tube size (mm):  7.5    Tube type:  Cuffed    Number of attempts:  1    Cricoid pressure: no      Tube visualized through cords: yes    Placement assessment:     ETT to lip:  22    Tube secured with:  ETT holder    Breath sounds:  Equal    Placement verification: chest rise, condensation, CXR verification, direct visualization, equal breath sounds and ETCO2 detector      CXR findings:  ETT in proper place  Post-procedure details:     Patient tolerance of procedure:  Tolerated well, no immediate complications  Comments:      On CXR, tube noted at the carina. Tube was pulled back to  19 at the lips

## 2023-01-28 NOTE — Unmapped (Addendum)
Ranjit Rozycki Niveah Nolle is a 72 y.o. female with pertinent PMHx of s/p DDKT 08/06/22, HTN, HLD, T2DM, recent hx candida esophagitis, AoCD, thrombocytopenia that presented to [facility] from OSH with fever.Pt with known history of MDR UTI    # s/p deceased donor kidney transplant (DCD) 08/06/2022   Native kidney disease biopsy-proven diabetic nephropathy   Graft function: creatinine 0.9-1.2 recently; most recently around 1.2 - 1.4  DSAs: not present as of 09/06/22    #Fever  #Recurrent MDR UTI  - UA with evidence of UTI   - Obtain renal US for pyelo   - Agree with broad spectrum abx and work up as outlined in Dr. Leonel Ramsay note  - ICID consult in the AM     # Immunosuppression  Continue Tacrolimus (Prograf) 1 mg AM, 0.5 mg PM, goal trough 5-7 ng/mL; Please obtain daily AM tac trough   Decrease Mycophenolate (Cellcept/Myfortic) from 500 mg BID to 250 mg BID; Will reassess in the AM  Prednisone 5 mg daily  Pt is being planned for switch to belatacept in OP setting      # BP management   Pt initially hypotensive on arrival but now hypertensive. If persistently hypertensive  - Resume amlodipine 5 mg BID  - Hold carvedilol 25 mg BID  - Hold chlorthalidone 25 mg daily  - Resume hydralazine 50 mg BID  - Hold spironolactone 25 mg daily    # Infectious disease  CMV D+/R+, EBV D+/R+  CMV ppx:  Valcyte x3 mo  PJP ppx: Bactrim x6 mo    Donato Heinz, MD   Nephrology Fellow

## 2023-01-28 NOTE — Unmapped (Signed)
01/28/23 0511 01/28/23 0550   Vent Screening   Total RSBI  --  (S)  71   SBT inclusion? (S)  yes  --    SBT Result?  --  (S)  passed   Settings   Vent Type Adult Adult   Vent status On On   FiO2 (%) 40 % 40 %   Vent Mode (S)  PSV-CPAP PSV-CPAP   Insp Rise Time (%) 5 % 5 %   Trigger Type? Flow Flow   Trigger Sensitivity Flow (L/min) 3 L/min 3 L/min   Pressure Support (cm H2O) (S)  5 cm H20 5 cm H20   PEEP 5 cm H20 5 cm H20   Insp Time 0.64 sec 1.21 sec   Readings   ETCO2 24 mmHg 26 mmHg   PIP 13 cm H2O 11 cm H2O   MAP Observed 8 cmH2O 8 cmH2O   Minute Volume 6.22 L/min 5.77 L/min   Vt (Exhaled) 245 mL 280 mL   Plateau Pressure 10 cm H2O 10 cm H2O   Resp Rate Observed 29 20

## 2023-01-28 NOTE — Unmapped (Signed)
Central Venous Catheter Insertion Procedure Note     Date of Service: 01/28/2023    Patient Name: Julie [name]  Patient MRN: [id number]    Line type:  Triple Lumen    Indications:  Inadequate peripheral access and septic shock    Consent:  I explained the potential benefits and risks of the procedure, including  catheter malposition, pneumothorax, cardiac complications, thrombosis, hematoma formation, arterial cannulation, air embolism local/catheter-related infection, nerve/tissue damage and sepsis. I explained potential alternatives. The patient/HCDM understands these risks, agrees to the procedure, and signed the informed consent form.    Procedure Details:   Time-out was performed immediately prior to the procedure.    The left internal jugular vein was identified using bedside ultrasound. This area was prepped and draped in the usual sterile fashion. Maximum sterile technique was used including antiseptics, cap, gloves, gown, hand hygiene, mask, and sterile sheet.  The patient was placed in Trendelenburg position. Local anesthesia with 1% lidocaine was applied subcutaneously then deep to the skin. The introducer was then inserted into the internal jugular vein using ultrasound guidance.    Using the Seldinger Technique a Triple Lumen was placed with each port easily flushed and freely drawing venous blood.    The catheter was secured with sutures. A sterile CHG drsg was applied to the site.    Condition:  The patient tolerated the procedure well and remains in the same condition as pre-procedure.    Complications:  None; patient tolerated the procedure well.    Plan:  CXR confirmed appropriate placement of central line. CVAD order placed OK to use     Aaron Edelman, MD

## 2023-01-28 NOTE — Unmapped (Addendum)
[facility]  Emergency Department Provider Note     ED Clinical Impression     Final diagnoses:   Immunosuppressed status (CMS-HCC) (Primary)   Altered mental status, unspecified altered mental status type      Impression, Medical Decision Making, ED Course     Impression: 72 y.o. female who has a past medical history of Anemia, Anemia due to stage 5 chronic kidney disease, not on chronic dialysis (CMS-HCC) (08/08/2022), 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), Hypertension, Hypothyroidism (acquired), Kidney transplant status, cadaveric (08/08/2022), and Type 2 diabetes mellitus (CMS-HCC). who presents with fever, vomiting and altered mental status as described below.     DDx/MDM: Overall this is a non-differentiated altered mental status patient.  The differential is broad.  It includes toxicologic causes, trauma.  Infection such as encephalitis and meningitis.  Ischemia such as cardiac or intracranial.  Insulin problem such as hypoglycemia and hyperglycemia.  Primary neurologic problem such as stroke, hemorrhagic stroke, encephalopathy, seizure or other primary neurologic disorder.  Hepatic encephalopathy.  Endocrine disorders.  Electrolyte abnormalities.      Diagnostic workup as below. Will treat patient with sepsis protocol.    Orders Placed This Encounter   Procedures    INTUBATION    Blood Culture    Blood Culture    Rapid Influenza / RSV / COVID PCR    Urine Culture    CT head WO contrast    XR Chest 1 view Portable    CT Abdomen Pelvis Wo Contrast    XR Chest 1 view Portable    US Renal Transplant W Doppler    XR Chest Portable    Urinalysis with Microscopy with Culture Reflex    ED Extra Tubes    Comprehensive Metabolic Panel    Lactate Sepsis, Venous    CBC w/ Differential    hsTroponin I (single, no delta)    Tacrolimus Level, Trough    Blood Gas, Venous    Lactate, Venous, Whole Blood    Basic metabolic panel    Urinalysis with Microscopy    Hemoglobin A1c (order if there has not been one within the previous 3 months)    Blood Gas, Venous    Triglycerides    Tacrolimus Level, Trough    CMV DNA, quantitative, PCR    EBV QUANTITATIVE PCR, BLOOD    IgG    Blood Gas Critical Care Panel, Venous    Blood Gas Critical Care Panel, Venous    Vancomycin, Trough    CBC    Basic Metabolic Panel    Magnesium Level    Misc nursing order (Sepsis Timer)    Cardiac Monitor    Oxygen sat continuous monitoring    Notify Provider    In and Out (I & O) cath    Notify Provider    RN to notify pharmacy immediately that an antibiotic has been ordered from the Sepsis Order Set (if not available in the [employer] or if not yet verified)    Vital signs    Notify Provider    Weigh patient    Incentive Spirometry    Flush line per protocol    Place and maintain sequential compression device    Analgesia goal    RASS goal    Delirium goal    Assess    Assess    Assess    Notify Provider    Misc nursing order (specify)    Misc nursing order (specify)    Misc nursing order (specify)  Misc nursing order (specify)    Misc nursing order (specify)    Misc nursing order (specify)    Notify Provider    Non-Violent Restraint Monitoring    Misc nursing order (specify)    Misc nursing order (specify)    Central Venous Access Device Status (CVAD)    Full Code    Inpatient consult to Nephrology    Inpatient consult to Infectious Diseases    Adult Ventilator    Monitor End Tidal CO2 /Capnography    ABG draw by RT ABG only    Spontaneous Breathing Trial    Spontaneous Breathing Trial    ECG 12 Lead    Type and Screen    Insert peripheral IV    Insert peripheral IV    Insert 2nd peripheral IV    Insert peripheral IV    Saline lock IV    Place Patient in Bed    ED Admit Decision    Restraints non-violent or non-self destructive       ED Course as of 01/28/23 0942   Sat Jan 27, 2023   1808 Lactate of 2.  Patient is already been started on sepsis protocol due to immunocompromised state.       MDM Elements  Discussion of Management with other Physicians, QHP or Appropriate Source: See above  Independent Interpretation of Studies: See above  I have reviewed recent and relevant previous record, including: See above  Escalation of Care including OBS/Admission/Transfer was considered: See above  Social Determinants that significantly affected care: See above  Prescription drugs considered but not prescribed: See above  Diagnostic tests considered but not performed: See above  ____________________________________________    The case was discussed with the attending physician, who is in agreement with the above assessment and plan.      History     Chief Complaint  Chief Complaint   Patient presents with    Fever Immunocompromised       HPI   Julie [name] is a 72 y.o. female with past medical history as below who presents with fever, vomiting and altered mental status.Patient vomited in waiting room after arrival.  Husband states that it all started 2 days ago after PowerPort was removed. Husband states that she had a 101-102 degree fever at home and read that the after visit summary told him to come to the emergency department immediately if she developed fever.  Additional subjective history from patient was unobtainable due to mental status.    Outside Historian(s): I have obtained additional history/collateral from husband.    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)     since July 2017    Hypertension     Hypothyroidism (acquired)     Kidney transplant status, cadaveric 08/08/2022    Type 2 diabetes mellitus (CMS-HCC)        Past Surgical History:   Procedure Laterality Date    CATARACT EXTRACTION      HYSTERECTOMY      OOPHORECTOMY      PERITONEAL CATHETER INSERTION      PR LAP INSERTION TUNNELED INTRAPERITONEAL CATHETER N/A 09/27/2018    Procedure: LAPAROSCOPY, SURGICAL; WITH INSERTION OF INTRAPERITONEAL CANNULA OR CATHETER, PERMANENT;  Surgeon: Leona Carry, MD;  Location: MAIN OR [facility];  Service: Transplant    PR LAP  INSERTION TUNNELED INTRAPERITONEAL CATHETER N/A 04/08/2019    Procedure: LAPAROSCOPY, SURGICAL; WITH INSERTION OF INTRAPERITONEAL CANNULA OR CATHETER, PERMANENT;  Surgeon: Leona Carry, MD;  Location: MAIN OR [facility];  Service: Transplant    PR LAP REVISE INTRAPERITONEAL CATHETER N/A 08/26/2019    Procedure: LAPAROSCOPY, SURGICAL; W/REVIS PREV PLACED INTRAPERITONEAL CANNULA/CATH, REMOV INTRALUMIN OBSTRUCT MATERIAL;  Surgeon: Leona Carry, MD;  Location: MAIN OR [facility];  Service: Transplant    PR REDUCE VOLVULUS,INTUSS,INTERN HERNIA N/A 10/13/2022    Procedure: REDUCTION OF VOLVULUS, INTUSSUSCEPTION, INTERNAL HERNIA, BY LAPAROTOMY;  Surgeon: Suella Broad, MD;  Location: MAIN OR [place];  Service: Trauma    PR REMOVAL TUNNELED INTRAPERITONEAL CATHETER N/A 05/06/2018    Procedure: REMOVAL OF PERMANENT INTRAPERITONEAL CANNULA OR CATHETER;  Surgeon: Leona Carry, MD;  Location: MAIN OR [facility];  Service: Transplant    PR REMOVE PERITONEAL FOREIGN BODY N/A 04/08/2019    Procedure: Removal Of Peritoneal Of Foreign Body From Peritoneal Cavity;  Surgeon: Leona Carry, MD;  Location: MAIN OR [facility];  Service: Transplant    PR TRANSPLANT,PREP RENAL GRAFT/ARTERIAL Right 08/06/2022    Procedure: [facility] RECONSTRUCTION CADAVER/LIVING DONOR RENAL ALLOGRAFT PRIOR TO TRANSPLANT; ARTERIAL ANASTOMOSIS EAC;  Surgeon: Toledo, Lilyan Punt, MD;  Location: MAIN OR [facility];  Service: Transplant    PR TRANSPLANTATION OF KIDNEY Right 08/06/2022    Procedure: RENAL ALLOTRANSPLANTATION, IMPLANTATION OF GRAFT; WITHOUT RECIPIENT NEPHRECTOMY;  Surgeon: Toledo, Lilyan Punt, MD;  Location: MAIN OR [facility];  Service: Transplant    TUBAL LIGATION           Current Facility-Administered Medications:     acetaminophen (TYLENOL) tablet 1,000 mg, 1,000 mg, Oral, Q8H PRN, Trina Ao, MD, 1,000 mg at 01/28/23 0151    aluminum-magnesium hydroxide-simethicone (MAALOX MAX) 80-80-8 mg/mL oral suspension, 30 mL, Oral, Q4H PRN, Bernita Raisin, MD    atorvastatin (LIPITOR) tablet 10 mg, 10 mg, Oral, Daily, Bernita Raisin, MD, 10 mg at 01/28/23 0811    dextrose (D10W) 10% bolus 125 mL, 12.5 g, Intravenous, Q10 Min PRN, Bernita Raisin, MD    fentaNYL (PF) (SUBLIMAZE) injection 25 mcg, 25 mcg, Intravenous, Q30 Min PRN, 25 mcg at 01/28/23 0021 **OR** fentaNYL (PF) (SUBLIMAZE) injection 50 mcg, 50 mcg, Intravenous, Q30 Min PRN, Bernita Raisin, MD    glucagon injection 1 mg, 1 mg, Intramuscular, Once PRN, Bernita Raisin, MD    glucose chewable tablet 16 g, 16 g, Oral, Q10 Min PRN, Bernita Raisin, MD    guaiFENesin (ROBITUSSIN) oral syrup, 200 mg, Oral, Q4H PRN, Bernita Raisin, MD    haloperidol LACTATE (HALDOL) injection 5 mg, 5 mg, Intravenous, Q6H PRN, Bernita Raisin, MD, 5 mg at 01/28/23 0151    hydrocortisone sod succ (Solu-CORTEF) injection 20 mg, 20 mg, Intravenous, Daily, Astrike-Davis, Emma, MD, 20 mg at 01/28/23 0023    hydrocortisone sod succ (Solu-CORTEF) injection 40 mg, 40 mg, Intravenous, Daily, Astrike-Davis, Kara Mead, MD, 40 mg at 01/28/23 0811    insulin lispro (HumaLOG) injection 0-20 Units, 0-20 Units, Subcutaneous, ACHS, Bernita Raisin, MD, 1 Units at 01/28/23 1610    levothyroxine (SYNTHROID) tablet 112 mcg, 112 mcg, Oral, daily, Elardo, Marissa, MD, 112 mcg at 01/28/23 0544    melatonin tablet 3 mg, 3 mg, Oral, Nightly PRN, Bernita Raisin, MD    meropenem (MERREM) 500 mg in sodium chloride 0.9 % (NS) 100 mL IVPB-MBP, 500 mg, Intravenous, Q12H SCH, Bernita Raisin, MD, Stopped at 01/28/23 9604    mycophenolate (CELLCEPT) capsule 250 mg, 250 mg, Oral, BID, Trina Ao, MD, 250  mg at 01/28/23 1610    NORepinephrine 8 mg in dextrose 5 % 250 mL (32 mcg/mL) infusion PMB, 0-30 mcg/min, Intravenous, Continuous, Jolinda Croak, MD, Last Rate: 3.8 mL/hr at 01/28/23 0544, 2 mcg/min at 01/28/23 0544    ondansetron (ZOFRAN-ODT) disintegrating tablet 4 mg, 4 mg, Oral, Q6H PRN, Trina Ao, MD    propofol (DIPRIVAN) infusion 10 mg/mL, 0-50 mcg/kg/min, Intravenous, Continuous, Jolinda Croak, MD, Last Rate: 6.5 mL/hr at 01/28/23 9604, 20 mcg/kg/min at 01/28/23 5409    senna (SENOKOT) tablet 2 tablet, 2 tablet, Oral, Nightly PRN, Bernita Raisin, MD    tacrolimus (PROGRAF) capsule 0.5 mg, 0.5 mg, Oral, Nightly, Choi, Hee Jae, DO, 0.5 mg at 01/28/23 0039    tacrolimus (PROGRAF) capsule 1 mg, 1 mg, Oral, Daily, Choi, Hee Jae, DO    Allergies  Gabapentin and Valacyclovir    Family History  Family History   Problem Relation Age of Onset    Diabetes Mother     Alzheimer's disease Father     Diabetes Sister     Diabetes Sister     Diabetes Sister     Anesthesia problems Neg Hx     Bleeding Disorder Neg Hx        Social History  Social History     Tobacco Use    Smoking status: Former     Current packs/day: 0.00     Average packs/day: 0.8 packs/day for 30.0 years (22.5 ttl pk-yrs)     Types: Cigarettes     Start date: 06/06/1976     Quit date: 06/06/2006     Years since quitting: 16.6    Smokeless tobacco: Former   Substance Use Topics    Alcohol use: Yes     Comment: only special occasions    Drug use: No        Physical Exam     VITAL SIGNS:      Vitals:    01/28/23 0655 01/28/23 0700 01/28/23 0851 01/28/23 0916   BP: 145/30  144/25    Pulse: 69 68 64    Resp: 22 21 21     Temp:       TempSrc:       SpO2: 100% 100% 100%    Weight:    54.4 kg (119 lb 14.9 oz)   Height:   152.4 cm (5') 152.4 cm (5')       Constitutional: Ill-appearing, elderly female  HEENT: Normocephalic and atraumatic. Conjunctivae clear. No congestion. Moist mucous membranes.   Cardiovascular: Rate as above. Systolic murmur appreciated. Brisk capillary refill. Normal skin turgor.  Respiratory: Normal respiratory effort. Breath sounds are normal. There are no wheezing or crackles heard.  Gastrointestinal: Soft, non-distended, non-tender.  Genitourinary: Deferred.  Musculoskeletal: Deferred due to patient's mental status.    Neurologic: Patient not alert or oriented but followed some commands. Incoherent speech.  Skin: Skin is warm with shivering at bedside. No rash noted.       Radiology     US Renal Transplant W Doppler   Final Result   --Decreased peak systolic velocity of the main renal artery at the anastomosis with overall decreased resistive indices when compared to 12/25/2022.   --Thin, heterogeneous collection lateral to the transplant kidney, may reflect residua of the perinephric collection noted on prior studies.                     XR Chest Portable   Final Result  Left internal jugular approach central venous catheter tip projects over the cavoatrial junction.      Interval advancement of the enteric tube with tip and sideport now projecting beyond the field-of-view.      Otherwise stable chest.      CT Abdomen Pelvis Wo Contrast   Final Result   Addendum (preliminary) 1 of 1   ====================   ADDENDUM:   (01/28/2023 7:47 AM)   On review, the following additional findings were noted:      Esophagogastric tube noted with tip terminating at, or just distal to, the    GE junction. If positioning within the stomach is desired, slight    advancement is recommended for optimal placement.      -----------------------------------------------         Final   1.  No new fluid collection or abscess identified.   2.  Similar-appearing inflammatory stranding surrounding the left lower quadrant transplant kidney with stable to mildly decreased size of the crescentic fluid collection within the overlying soft tissues measuring up to 7.4 cm in size. There is similar-appearing thickening and inflammatory stranding of the adjacent abdominal muscular/peritoneum. Findings are nonspecific and could potentially reflect an evolving hematoma in the postsurgical setting. Please note that CT cannot accurately assess the sterility of any fluid collection. As such, any potential abscess or superimposed infection is not entirely excluded   3.  Other chronic or incidental findings, as described within the body of the report.         XR Chest 1 view Portable   Final Result      *  Unchanged position of the esophagogastric tube with tip projecting over the GE junction and sideport projecting over the distal esophagus. Recommend advancement by 10 cm. Otherwise stable chest.   *  Interstitial pulmonary edema.         ====================   MODIFIED REPORT:   (01/28/2023 8:05 AM)   This report has been modified from its preliminary version; you may check the prior versions of radiology report, results history link for prior report versions (if they were previously visible in Epic).      -----------------------------------------------      XR Chest 1 view Portable   Final Result   Bibasilar hazy airspace opacities, which is nonspecific an likely presents atelectasis; however, aspiration or infection cannot be excluded.      Prominence of the vasculature and interstitium which could be related to low lung volumes or mild pulmonary edema.      Endotracheal tube with tip projecting approximately 3 cm of the carina.       Esophagogastric tube with tip projecting over the left upper abdomen and sideport projecting over the distal esophagus, recommend advancement by approximately 5 to 8 cm.         CT head WO contrast   Final Result   No acute intracranial findings.             Pertinent labs & imaging results that were available during my care of the patient were independently interpreted by me and considered in my medical decision making (see chart for details).    Portions of this record have been created using Scientist, clinical (histocompatibility and immunogenetics). Dictation errors have been sought, but may not have been identified and corrected.        Ian Malkin, DO  Resident  01/27/23 2234       Ian Malkin, DO  Resident  01/28/23 [phone]

## 2023-01-28 NOTE — Unmapped (Signed)
Vancomycin Therapeutic Monitoring Pharmacy Note    Julie [name] is a 72 y.o. female starting vancomycin. Date of therapy initiation: 01/27/23    Indication: Suspected infection     Prior Dosing Information: None/new initiation     Goals:  Therapeutic Drug Levels  Vancomycin trough goal: 10-15 mg/L    Additional Clinical Monitoring/Outcomes  Renal function, volume status (intake and output)    Results: Not applicable    Wt Readings from Last 1 Encounters:   01/26/23 54.4 kg (120 lb)     Creatinine   Date Value Ref Range Status   01/28/2023 1.92 (H) 0.55 - 1.02 mg/dL Final   16/04/9603 5.40 (H) 0.55 - 1.02 mg/dL Final   98/05/9146 8.29 (H) 0.55 - 1.02 mg/dL Final        Pharmacokinetic Considerations and Significant Drug Interactions:  Adult (estimated initial): Vd = 85.9 L, ke = 0.0592 hr-1  Concurrent nephrotoxic meds: not applicable    Assessment/Plan:  Recommendation(s)  Start vancomycin 1 gram IV q12h   Estimated trough on recommended regimen: 12 mg/L    Follow-up  Level due: prior to fourth or fifth dose  A pharmacist will continue to monitor and order levels as appropriate    Please page service pharmacist with questions/clarifications.    Winferd Humphrey, PharmD

## 2023-01-28 NOTE — Unmapped (Signed)
Transplant Nephrology Consult     Requesting provider: Dr. Colon Branch  Service requesting consult: MICU  Reason for consult: s/p kidney transplant    Assessment/Recommendations: Julie [name] is a/an 72 y.o. 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    # Status post kidney transplant, Allograft Function (unstable):   - b/l recently ~0.9-1.2 though at 1.2-1.4 recently and up to 1.9 today  Sediment examined with numerous WBCs, 3 monomorphic RBCs per hpf, few finely granular casts and few RTEs, consistent with mild tubular injury and UTI  - will trend after volume repletion   - follow up EBV, CMV PCR from 7/7    # Immunosuppression [High risk medical decision making for drug therapy requiring intensive monitoring for toxicity]  (Home regimen: pred 5 daily, cellcept 500 bid, prograf 1/0.5 goal of 5-7). Planned for belatacept switch outpatient  -continue cellcept at reduced dose of 250mg  bid while being treated for infection  - continue prednisone 5mg , tacrolimus 1mg /0.5mg   -Please obtain trough tacrolimus levels prior to the morning dose of the medication. The tacrolimus goal level is 5-7 ng/mL.    # Infectious Prophylaxis and Monitoring:   -CMV D+/R+, EBV D+/R+ - completed prophylaxis for all except PJP - can switch to atovaquone if tolerated for last week (end 02/04/2023)  - f/u PCR of both  - currently being treated for recurrent UTI (despite suppressive therapy) though fluid collection peri-transplant appears to be stable  - moderate risk for Hep B reactivation - LFTs, HBV DNA, HBsAg q 3 months    # Blood Pressure / Volume:  - Blood Pressure: home regimen of coreg 25bid, chlorthalidone 25mg , hydralazine 50mg  bid, spironolactone 25mg  ,amlodipine 5mg  bid. All held appropriately given hypotension this AM  - Volume Status: close to euvolemic    #Acid/Base/Electrolytes:   - mild NAGMA  Lab Results   Component Value Date    K 4.2 01/28/2023    K 3.8 01/28/2023    Lab Results   Component Value Date    CO2 18.0 (L) 01/28/2023    CO2 18.0 (L) 01/27/2023        # c/f UTI  # AMS s/p intubation:   -planned for extubation today    **Transplant patients with an open wound require wound care with sterile water only. The patient should be counseled on this at the time of discharge if they have not already been doing this.**     Patient was seen and discussed with attending nephrologist Dr. Galen Manila. Recommendations were communicated to the primary team. Nephrology will continue to follow.    Thank you for this consult.   Please page the renal fellow on call with questions/concerns.    Clyde Lundborg, MD  Division of Nephrology and Hypertension  [facility]  01/28/2023      _____________________________________________________________________________________    Transplant Background  Date of Transplant: 08/06/2022 (Kidney)  Type of Transplant: DCD  KDPI: 56%  Cold ischemic time: 877 minutes (14 hr 37 min)  Warm ischemic time: 32 minutes  cPRA: 0%  HLA match:   Blood type: Donor A1, Recipient A POS  ID: CMV D+/R+, EBV D+/R+  Native Kidney Disease: Diabetes              Native kidney biopsy: Diabetic nephropathy (done elsewhere)  Pre-transplant dialysis course: PD started July 2017, transitioned to HD October 2019 due to catheter dysfunction/inadequate dialysis  Prior Transplants: None  Induction: Basiliximab  Early steroid withdrawal: No     Biopsies:   None    History of Present Illness: Julie [name] is a/an 72 y.o. 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    Pt intubated so hx obtained from husband and chart. She was recently admitted for MDR UTI and subsequently had fever to 101 with chills along with vomiting. Per her husband she had some spells of nonresponsiveness which was unusual for her and in the ED, given vomiting and those spells, was ultimately intubated for airway protection. She briefly required pressors but not off.    UA with numerous WBCs, bacteria. Has previously grown multidrug resistant E.coli.  Most recently admitted to [facility] 12/2022  for fever and treated for presumptive pyelonephritis. Powerline for IV antibiotics was removed 7/5.      Medications:   Current Facility-Administered Medications   Medication Dose Route Frequency Provider Last Rate Last Admin    acetaminophen (TYLENOL) tablet 1,000 mg  1,000 mg Oral Q8H PRN Trina Ao, MD   1,000 mg at 01/28/23 0151    aluminum-magnesium hydroxide-simethicone (MAALOX MAX) 80-80-8 mg/mL oral suspension  30 mL Oral Q4H PRN Bernita Raisin, MD        atorvastatin (LIPITOR) tablet 10 mg  10 mg Oral Daily Bernita Raisin, MD   10 mg at 01/28/23 0811    dextrose (D10W) 10% bolus 125 mL  12.5 g Intravenous Q10 Min PRN Bernita Raisin, MD        glucagon injection 1 mg  1 mg Intramuscular Once PRN Bernita Raisin, MD        glucose chewable tablet 16 g  16 g Oral Q10 Min PRN Bernita Raisin, MD        guaiFENesin (ROBITUSSIN) oral syrup  200 mg Oral Q4H PRN Bernita Raisin, MD        insulin lispro (HumaLOG) injection 0-20 Units  0-20 Units Subcutaneous ACHS Bernita Raisin, MD   1 Units at 01/28/23 1610    levothyroxine (SYNTHROID) tablet 112 mcg  112 mcg Oral daily Trina Ao, MD   112 mcg at 01/28/23 0544    melatonin tablet 3 mg  3 mg Oral Nightly PRN Bernita Raisin, MD        meropenem (MERREM) 500 mg in sodium chloride 0.9 % (NS) 100 mL IVPB-MBP  500 mg Intravenous Q12H [facility] Bernita Raisin, MD   Stopped at 01/28/23 9604    mycophenolate (CELLCEPT) capsule 250 mg  250 mg Oral BID Trina Ao, MD   250 mg at 01/28/23 0811    ondansetron (ZOFRAN-ODT) disintegrating tablet 4 mg  4 mg Oral Q6H PRN Trina Ao, MD        Melene Muller ON 01/29/2023] predniSONE (DELTASONE) tablet 5 mg  5 mg Oral Daily Choi, Hee Jae, DO        senna (SENOKOT) tablet 2 tablet  2 tablet Oral Nightly PRN Bernita Raisin, MD        tacrolimus (PROGRAF) capsule 0.5 mg  0.5 mg Oral Nightly Myriam Jacobson Jae, DO   0.5 mg at 01/28/23 0039    tacrolimus (PROGRAF) capsule 1 mg  1 mg Oral Daily Choi, Hee Jae, DO   1 mg at  01/28/23 1100        ALLERGIES  Gabapentin and Valacyclovir    MEDICAL HISTORY  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)     since July 2017    Hypertension     Hypothyroidism (acquired)     Kidney transplant status, cadaveric 08/08/2022    Type 2 diabetes mellitus (CMS-HCC)         SOCIAL HISTORY  Social History     Socioeconomic History    Marital status: Married     Spouse name: Arlette Delira    Number of children: 2   Occupational History    Occupation: retired   Tobacco Use    Smoking status: Former     Current packs/day: 0.00     Average packs/day: 0.8 packs/day for 30.0 years (22.5 ttl pk-yrs)     Types: Cigarettes     Start date: 06/06/1976     Quit date: 06/06/2006     Years since quitting: 16.6    Smokeless tobacco: Former   Substance and Sexual Activity    Alcohol use: Yes     Comment: only special occasions    Drug use: No   Social History Narrative    Lives in [place] with her husband. Moved from [place] to the [place] when she was around 19.     Social Determinants of Health     Financial Resource Strain: Low Risk  (11/17/2022)    Received from [facility], [facility]    Overall Financial Resource Strain (CARDIA)     Difficulty of Paying Living Expenses: Not hard at all   Food Insecurity: No Food Insecurity (11/17/2022)    Received from [facility], [facility]    Hunger Vital Sign     Worried About Running Out of Food in the Last Year: Never true     Ran Out of Food in the Last Year: Never true   Transportation Needs: No Transportation Needs (11/17/2022)    Received from [facility], [facility]    [facility] - Transportation     In the past 12 months, has lack of transportation kept you from medical appointments or from getting medications?: No     Lack of Transportation (Non-Medical): No   Social Connections: Unknown (04/06/2018)    Received from [facility], [facility]    Social Connection and Isolation Panel [NHANES]     Marital Status: Married        FAMILY HISTORY  Family History   Problem Relation Age of Onset    Diabetes Mother     Alzheimer's disease Father     Diabetes Sister     Diabetes Sister     Diabetes Sister     Anesthesia problems Neg Hx     Bleeding Disorder Neg Hx       Unable to obtain    REVIEW OF SYSTEMS:   Otherwise as per HPI, all other systems reviewed and negative    PHYSICAL EXAM:   Vitals:    01/28/23 1138   BP:    Pulse: 68   Resp: 18   Temp:    SpO2: 100%     I/O this shift:  In: 52 [I.V.:82]  Out: 40 [Urine:40]  Intake/Output Summary (Last 24 hours) at 01/28/2023 1457  Last data filed at 01/28/2023 1000  Gross per 24 hour   Intake 1281.46 ml   Output 120 ml   Net 1161.46 ml     General: tired-appearing, NAD  HEENT: conjunctival anicteric  CV: RRR nl s1/s2 no s3/s4 no m/r, no JVD, no LE edema  Lungs: nl WOB, CTAB w/o adventitious sounds, intubated  Abd: soft, non-tender, non-distended w/o r/g  Skin: no visible lesions or rashes  Psych: alert  Neuro: alert, moving    Test Results:   Recent Labs     Units 01/22/23  1300 01/22/23  1300 01/27/23  1556 01/27/23  2004 01/28/23  0108 01/28/23  0422 01/28/23  1012   NA mmol/L 141   < > 141 138 141 - 139 139 138   K mmol/L 5.3*  --  4.1 4.6 3.8 - 3.7 3.8 4.2   CL mmol/L 109*  --  112* 110* 113*  --   --    CO2 mmol/L 18*  --  20.0 18.0* 18.0*  -- --    BUN mg/dL 46*  --  44* 36* 43*  --   --    CREATININE mg/dL 4.69*  --  6.29* 5.28* 1.92*  --   --    GLU mg/dL 413*  --  244* 010* 272*  --   --    ALBUMIN g/dL 4.1  --  3.9  --  2.7*  --   --    PROT g/dL 6.2  --  7.1  --  5.2*  --   --    BILITOT mg/dL 0.2  --  0.6  --  0.4  --   --    ALKPHOS U/L 56  --  57  --  43*  --   --    ALT U/L 8  --  11  --  8*  --   --    AST U/L 11  --  12  --  11  --   --     < > = values in this interval not displayed.     Recent Labs     Units 01/22/23  1300 01/27/23  1556 01/28/23  0108   ALBUMIN g/dL 4.1 3.9 2.7*     Recent Labs     Units 01/22/23  1300 01/27/23  1556 01/28/23  0108   WBC 10*9/L 6.3 11.5* 9.6   RBC 10*12/L 2.99* 3.25* 2.62*   HGB g/dL 9.7* 53.6* 8.3*   HCT % 30.0* 31.1* 25.0*   MCV fL 100* 95.8* 95.3   MCH pg 32.4 31.8 31.6   MCHC g/dL 64.4 03.4 74.2   RDW % 12.3 13.6 13.5   PLT 10*9/L 98 78* 60*   MPV fL  --  9.3 8.8       Imaging:   Renal US 7/7  Impression   --Decreased peak systolic velocity of the main renal artery at the anastomosis with overall decreased resistive indices when compared to 12/25/2022.   --Thin, heterogeneous collection lateral to the transplant kidney, may reflect residua of the perinephric collection noted on prior studies.     CT A/P non con  Impression   1.  No new fluid collection or abscess identified.   2.  Similar-appearing inflammatory stranding surrounding the left lower quadrant transplant kidney with stable to mildly decreased size of the crescentic fluid collection within the overlying soft tissues measuring up to 7.4 cm in size.  There is similar-appearing thickening and inflammatory stranding of the adjacent abdominal muscular/peritoneum. Findings are nonspecific and could potentially reflect an evolving hematoma in the postsurgical setting. Please note that CT cannot accurately assess the sterility of any fluid collection. As such, any potential abscess or superimposed infection is not entirely excluded   3.  Other chronic or incidental findings, as described within the body of the report.       I have personally reviewed all relevant and available outside healthcare records related to this consultation.

## 2023-01-29 LAB — BLOOD GAS, VENOUS
BASE EXCESS VENOUS: -5.7 — ABNORMAL LOW (ref -2.0–2.0)
BASE EXCESS VENOUS: -5.4 — ABNORMAL LOW (ref -2.0–2.0)
BASE EXCESS VENOUS: -6.3 — ABNORMAL LOW (ref -2.0–2.0)
HCO3 VENOUS: 19 mmol/L — ABNORMAL LOW (ref 22–27)
HCO3 VENOUS: 19 mmol/L — ABNORMAL LOW (ref 22–27)
HCO3 VENOUS: 18 mmol/L — ABNORMAL LOW (ref 22–27)
O2 SATURATION VENOUS: 73.7 % (ref 40.0–85.0)
O2 SATURATION VENOUS: 68.2 % (ref 40.0–85.0)
O2 SATURATION VENOUS: 78.5 % (ref 40.0–85.0)
PCO2 VENOUS: 35 mmHg — ABNORMAL LOW (ref 40–60)
PCO2 VENOUS: 32 mmHg — ABNORMAL LOW (ref 40–60)
PCO2 VENOUS: 35 mmHg — ABNORMAL LOW (ref 40–60)
PH VENOUS: 7.35 (ref 7.32–7.43)
PH VENOUS: 7.38 (ref 7.32–7.43)
PH VENOUS: 7.34 (ref 7.32–7.43)
PO2 VENOUS: 41 mmHg (ref 30–55)
PO2 VENOUS: 37 mmHg (ref 30–55)
PO2 VENOUS: 47 mmHg (ref 30–55)

## 2023-01-29 LAB — CBC
HEMATOCRIT: 23.9 % — ABNORMAL LOW (ref 34.0–44.0)
HEMOGLOBIN: 8.1 g/dL — ABNORMAL LOW (ref 11.3–14.9)
MEAN CORPUSCULAR HEMOGLOBIN CONC: 33.8 g/dL (ref 32.0–36.0)
MEAN CORPUSCULAR HEMOGLOBIN: 32.2 pg (ref 25.9–32.4)
MEAN CORPUSCULAR VOLUME: 95.1 fL (ref 77.6–95.7)
MEAN PLATELET VOLUME: 9.8 fL (ref 6.8–10.7)
PLATELET COUNT: 55 10*9/L — ABNORMAL LOW (ref 150–450)
RED BLOOD CELL COUNT: 2.51 10*12/L — ABNORMAL LOW (ref 3.95–5.13)
RED CELL DISTRIBUTION WIDTH: 13.5 % (ref 12.2–15.2)
WBC ADJUSTED: 6.8 10*9/L (ref 3.6–11.2)

## 2023-01-29 LAB — BASIC METABOLIC PANEL
ANION GAP: 9 mmol/L (ref 5–14)
ANION GAP: 6 mmol/L (ref 5–14)
BLOOD UREA NITROGEN: 39 mg/dL — ABNORMAL HIGH (ref 9–23)
BLOOD UREA NITROGEN: 37 mg/dL — ABNORMAL HIGH (ref 9–23)
BUN / CREAT RATIO: 22
BUN / CREAT RATIO: 24
CALCIUM: 8.5 mg/dL — ABNORMAL LOW (ref 8.7–10.4)
CALCIUM: 7.9 mg/dL — ABNORMAL LOW (ref 8.7–10.4)
CHLORIDE: 114 mmol/L — ABNORMAL HIGH (ref 98–107)
CHLORIDE: 113 mmol/L — ABNORMAL HIGH (ref 98–107)
CO2: 19 mmol/L — ABNORMAL LOW (ref 20.0–31.0)
CO2: 20 mmol/L (ref 20.0–31.0)
CREATININE: 1.8 mg/dL — ABNORMAL HIGH
CREATININE: 1.53 mg/dL — ABNORMAL HIGH
EGFR CKD-EPI (2021) FEMALE: 29 mL/min/{1.73_m2} — ABNORMAL LOW (ref >=60)
EGFR CKD-EPI (2021) FEMALE: 36 mL/min/{1.73_m2} — ABNORMAL LOW (ref >=60)
GLUCOSE RANDOM: 98 mg/dL (ref 70–179)
GLUCOSE RANDOM: 270 mg/dL — ABNORMAL HIGH (ref 70–179)
POTASSIUM: 4 mmol/L (ref 3.4–4.8)
POTASSIUM: 4.6 mmol/L (ref 3.4–4.8)
SODIUM: 142 mmol/L (ref 135–145)
SODIUM: 139 mmol/L (ref 135–145)

## 2023-01-29 LAB — MAGNESIUM
MAGNESIUM: 1.4 mg/dL — ABNORMAL LOW (ref 1.6–2.6)
MAGNESIUM: 1.4 mg/dL — ABNORMAL LOW (ref 1.6–2.6)

## 2023-01-29 LAB — TACROLIMUS LEVEL, TROUGH: TACROLIMUS, TROUGH: 1.9 ng/mL — ABNORMAL LOW (ref 5.0–15.0)

## 2023-01-29 MED ADMIN — acetaminophen (TYLENOL) tablet 1,000 mg: 1000 mg | ORAL | @ 10:00:00

## 2023-01-29 MED ADMIN — mycophenolate (CELLCEPT) capsule 250 mg: 250 mg | ORAL

## 2023-01-29 MED ADMIN — sulfamethoxazole-trimethoprim (BACTRIM) 400-80 mg tablet 80 mg of trimethoprim: 1 | ORAL | @ 16:00:00 | Stop: 2023-02-05

## 2023-01-29 MED ADMIN — predniSONE (DELTASONE) tablet 5 mg: 5 mg | ORAL | @ 13:00:00

## 2023-01-29 MED ADMIN — nystatin (MYCOSTATIN) oral suspension: 500000 [IU] | ORAL | @ 04:00:00 | Stop: 2023-02-04

## 2023-01-29 MED ADMIN — atorvastatin (LIPITOR) tablet 10 mg: 10 mg | ORAL | @ 13:00:00

## 2023-01-29 MED ADMIN — nystatin (MYCOSTATIN) oral suspension: 500000 [IU] | ORAL | @ 19:00:00 | Stop: 2023-02-04

## 2023-01-29 MED ADMIN — tacrolimus (PROGRAF) capsule 0.5 mg: .5 mg | ORAL

## 2023-01-29 MED ADMIN — nystatin (MYCOSTATIN) oral suspension: 500000 [IU] | ORAL | @ 10:00:00 | Stop: 2023-02-04

## 2023-01-29 MED ADMIN — tacrolimus (PROGRAF) capsule 1 mg: 1 mg | ORAL | @ 13:00:00 | Stop: 2023-01-29

## 2023-01-29 MED ADMIN — acetaminophen (TYLENOL) tablet 1,000 mg: 1000 mg | ORAL

## 2023-01-29 MED ADMIN — levothyroxine (SYNTHROID) tablet 112 mcg: 112 ug | ORAL | @ 10:00:00

## 2023-01-29 MED ADMIN — meropenem (MERREM) 500 mg in sodium chloride 0.9 % (NS) 100 mL IVPB-MBP: 500 mg | INTRAVENOUS | @ 19:00:00 | Stop: 2023-02-05

## 2023-01-29 MED ADMIN — meropenem (MERREM) 500 mg in sodium chloride 0.9 % (NS) 100 mL IVPB-MBP: 500 mg | INTRAVENOUS | @ 13:00:00 | Stop: 2023-01-29

## 2023-01-29 MED ADMIN — meropenem (MERREM) 500 mg in sodium chloride 0.9 % (NS) 100 mL IVPB-MBP: 500 mg | INTRAVENOUS | Stop: 2023-02-10

## 2023-01-29 MED ADMIN — insulin lispro (HumaLOG) injection 0-20 Units: 0-20 [IU] | SUBCUTANEOUS | @ 20:00:00

## 2023-01-29 MED ADMIN — mycophenolate (CELLCEPT) capsule 250 mg: 250 mg | ORAL | @ 13:00:00

## 2023-01-29 NOTE — Unmapped (Signed)
Problem: Adult Inpatient Plan of Care  Goal: Plan of Care Review  Outcome: Ongoing - Unchanged  Goal: Patient-Specific Goal (Individualized)  Outcome: Ongoing - Unchanged  Goal: Absence of Hospital-Acquired Illness or Injury  Outcome: Ongoing - Unchanged  Intervention: Identify and Manage Fall Risk  Recent Flowsheet Documentation  Taken 01/28/2023 0800 by Roselee Culver, RN  Safety Interventions:   aspiration precautions   environmental modification   fall reduction program maintained   lighting adjusted for tasks/safety   low bed   nonskid shoes/slippers when out of bed   room near unit station  Intervention: Prevent Skin Injury  Recent Flowsheet Documentation  Taken 01/28/2023 1800 by Roselee Culver, RN  Positioning for Skin: Left  Device Skin Pressure Protection: adhesive use limited  Skin Protection: adhesive use limited  Taken 01/28/2023 1600 by Roselee Culver, RN  Positioning for Skin: Right  Device Skin Pressure Protection: adhesive use limited  Skin Protection: adhesive use limited  Taken 01/28/2023 1400 by Roselee Culver, RN  Positioning for Skin: Left  Device Skin Pressure Protection: adhesive use limited  Skin Protection: adhesive use limited  Taken 01/28/2023 1200 by Roselee Culver, RN  Positioning for Skin: Right  Device Skin Pressure Protection: adhesive use limited  Skin Protection: adhesive use limited  Taken 01/28/2023 1000 by Roselee Culver, RN  Positioning for Skin: Left  Device Skin Pressure Protection: adhesive use limited  Skin Protection: adhesive use limited  Taken 01/28/2023 0800 by Roselee Culver, RN  Positioning for Skin: Right  Device Skin Pressure Protection: adhesive use limited  Skin Protection:   adhesive use limited   silicone foam dressing in place  Intervention: Prevent and Manage VTE (Venous Thromboembolism) Risk  Recent Flowsheet Documentation  Taken 01/28/2023 1800 by Roselee Culver, RN  Anti-Embolism Device Type: SCD, Knee  Anti-Embolism Intervention: Other (Comment)  Taken 01/28/2023 1600 by Roselee Culver, RN  Anti-Embolism Device Type: SCD, Knee  Anti-Embolism Intervention: Other (Comment)  Taken 01/28/2023 1400 by Roselee Culver, RN  Anti-Embolism Device Type: SCD, Knee  Anti-Embolism Intervention: Other (Comment)  Taken 01/28/2023 1200 by Roselee Culver, RN  Anti-Embolism Device Type: SCD, Knee  Anti-Embolism Intervention: Other (Comment)  Taken 01/28/2023 1000 by Roselee Culver, RN  Anti-Embolism Device Type: SCD, Knee  Anti-Embolism Intervention: Other (Comment)  Taken 01/28/2023 0800 by Roselee Culver, RN  VTE Prevention/Management: anticoagulant therapy  Anti-Embolism Device Type: SCD, Knee  Anti-Embolism Intervention: Other (Comment)  Intervention: Prevent Infection  Recent Flowsheet Documentation  Taken 01/28/2023 0800 by Roselee Culver, RN  Infection Prevention:   cohorting utilized   hand hygiene promoted   rest/sleep promoted   single patient room provided  Goal: Optimal Comfort and Wellbeing  Outcome: Ongoing - Unchanged  Goal: Readiness for Transition of Care  Outcome: Ongoing - Unchanged  Goal: Rounds/Family Conference  Outcome: Ongoing - Unchanged     Problem: Fall Injury Risk  Goal: Absence of Fall and Fall-Related Injury  Outcome: Ongoing - Unchanged  Intervention: Identify and Manage Contributors  Recent Flowsheet Documentation  Taken 01/28/2023 1200 by Roselee Culver, RN  Self-Care Promotion: independence encouraged  Intervention: Promote Injury-Free Environment  Recent Flowsheet Documentation  Taken 01/28/2023 0800 by Roselee Culver, RN  Safety Interventions:   aspiration precautions   environmental modification   fall reduction program maintained   lighting adjusted for tasks/safety   low bed   nonskid shoes/slippers when out of bed   room near unit station  Problem: Self-Care Deficit  Goal: Improved Ability to Complete Activities of Daily Living  Outcome: Ongoing - Unchanged  Intervention: Promote Activity and Functional Independence  Recent Flowsheet Documentation  Taken 01/28/2023 1200 by Roselee Culver, RN  Self-Care Promotion: independence encouraged     Problem: Non-Violent Restraints  Goal: Patient will remain free of restraint events  Outcome: Ongoing - Unchanged  Goal: Patient will remain free of physical injury  Outcome: Ongoing - Unchanged     Problem: Mechanical Ventilation Invasive  Goal: Effective Communication  Outcome: Ongoing - Unchanged  Goal: Optimal Device Function  Outcome: Ongoing - Unchanged  Intervention: Optimize Device Care and Function  Recent Flowsheet Documentation  Taken 01/28/2023 1800 by Roselee Culver, RN  Oral Care: mouth swabbed  Taken 01/28/2023 1400 by Roselee Culver, RN  Oral Care: mouth swabbed  Taken 01/28/2023 1200 by Roselee Culver, RN  Airway Safety Measures: suction at bedside  Taken 01/28/2023 1000 by Roselee Culver, RN  Oral Care: mouth swabbed  Taken 01/28/2023 0800 by Roselee Culver, RN  Airway/Ventilation Management:   airway patency maintained   pulmonary hygiene promoted  Oral Care: mouth swabbed  Airway Safety Measures: suction at bedside  Goal: Mechanical Ventilation Liberation  Outcome: Ongoing - Unchanged  Intervention: Promote Extubation and Mechanical Ventilation Liberation  Recent Flowsheet Documentation  Taken 01/28/2023 1200 by Roselee Culver, RN  Environmental Support:   calm environment promoted   rest periods encouraged  Goal: Optimal Nutrition Delivery  Outcome: Ongoing - Unchanged  Goal: Absence of Device-Related Skin and Tissue Injury  Outcome: Ongoing - Unchanged  Intervention: Maintain Skin and Tissue Health  Recent Flowsheet Documentation  Taken 01/28/2023 1800 by Roselee Culver, RN  Device Skin Pressure Protection: adhesive use limited  Taken 01/28/2023 1600 by Roselee Culver, RN  Device Skin Pressure Protection: adhesive use limited  Taken 01/28/2023 1400 by Roselee Culver, RN  Device Skin Pressure Protection: adhesive use limited  Taken 01/28/2023 1200 by Roselee Culver, RN  Device Skin Pressure Protection: adhesive use limited  Taken 01/28/2023 1000 by Roselee Culver, RN  Device Skin Pressure Protection: adhesive use limited  Taken 01/28/2023 0800 by Roselee Culver, RN  Device Skin Pressure Protection: adhesive use limited  Goal: Absence of Ventilator-Induced Lung Injury  Outcome: Ongoing - Unchanged  Intervention: Prevent Ventilator-Associated Pneumonia  Recent Flowsheet Documentation  Taken 01/28/2023 1800 by Roselee Culver, RN  Head of Bed [facility]) Positioning: HOB at 30-45 degrees  Oral Care: mouth swabbed  Taken 01/28/2023 1400 by Roselee Culver, RN  Head of Bed [facility]) Positioning: HOB at 30-45 degrees  Oral Care: mouth swabbed  Taken 01/28/2023 1000 by Roselee Culver, RN  Head of Bed [facility]) Positioning: HOB at 30-45 degrees  Oral Care: mouth swabbed  Taken 01/28/2023 0800 by Roselee Culver, RN  Head of Bed [facility]) Positioning: HOB at 30-45 degrees  Oral Care: mouth swabbed     Problem: Skin Injury Risk Increased  Goal: Skin Health and Integrity  Outcome: Ongoing - Unchanged  Intervention: Optimize Skin Protection  Recent Flowsheet Documentation  Taken 01/28/2023 1800 by Roselee Culver, RN  Pressure Reduction Techniques: weight shift assistance provided  Head of Bed (HOB) Positioning: HOB at 30-45 degrees  Pressure Reduction Devices: pressure-redistributing mattress utilized  Skin Protection: adhesive use limited  Taken 01/28/2023 1600 by Roselee Culver, RN  Pressure Reduction Techniques: weight shift assistance provided  Pressure Reduction Devices: pressure-redistributing mattress utilized  Skin Protection: adhesive use limited  Taken 01/28/2023 1400 by Roselee Culver, RN  Pressure Reduction Techniques: weight shift assistance provided  Head of Bed (HOB) Positioning: HOB at 30-45 degrees  Pressure Reduction Devices: pressure-redistributing mattress utilized  Skin Protection: adhesive use limited  Taken 01/28/2023 1200 by Roselee Culver, RN  Pressure Reduction Techniques:   frequent weight shift encouraged   weight shift assistance provided  Pressure Reduction Devices: pressure-redistributing mattress utilized  Skin Protection: adhesive use limited  Taken 01/28/2023 1000 by Roselee Culver, RN  Activity Management: up ad lib  Pressure Reduction Techniques: weight shift assistance provided  Head of Bed (HOB) Positioning: HOB at 30-45 degrees  Pressure Reduction Devices: pressure-redistributing mattress utilized  Skin Protection: adhesive use limited  Taken 01/28/2023 0800 by Roselee Culver, RN  Pressure Reduction Techniques: weight shift assistance provided  Head of Bed (HOB) Positioning: HOB at 30-45 degrees  Pressure Reduction Devices: pressure-redistributing mattress utilized  Skin Protection:   adhesive use limited   silicone foam dressing in place

## 2023-01-29 NOTE — Unmapped (Signed)
IMMUNOCOMPROMISED HOST INFECTIOUS DISEASE PROGRESS NOTE    Assessment/Plan:     Julie [name] is a 72 y.o. 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 Had perinephric drain in place postoperatively, removed 08/30/22  - Immunosuppression: Tacrolimus (goal 5-7), MMF 500 BID (reduced 250mg  bid on 7/6) Pred 5  - Prophylaxis: valganciclovir x 3 months (mod risk, completed), TMP+SMX x 6 months  - baseline Cr ~1.1  - AKI 01/27/23 Estimated Creatinine Clearance: 20 mL/min (A) (based on SCr of 1.8 mg/dL (H)).     - 01/28/23 LIJ CVC, 01/27/23 foley     Pertinent comorbidities:  DM II (A1c 5.7 on 01/27/23)  Hysterectomy  SBO 10/07/22 s/p ex lap 10/13/22 w/ lysis of adhesions  # Recurrent thrombocytopenia attributed to infection 12/2022, 01/2023     Pertinent exposures:  Originally from [place]   Treated with ivermectin at the ID clinic in 2018  Increased amount of gardening and outdoor exposures over past several months     Summary of pertinent prior infections:  #History of Shingles 2017  #04/2018 Dialysis fluid [place] R: Ampicillin, Unasyn, Cefazolin,S: Ceftriaxone, Gentamicin, Levo, PipTazo, Tobra  #Thrush w/presumed candidal esophagitis 10/10/22 s/p 14 day treatment fluconazole  #Donor urine cx with C. Dubliensis 08/05/22, <10K, negative recipient samples, negative donor blood cx, treated with 2 weeks PO fluconazole  # Positive Hep B sAg and core antibodies, c/w resolved infection, with moderate risk for reactivation 02/2021  - 12/28/22 HBV DNA not detected and HBsAg pending  # Recurrent MDR E.Coli Bacteremia/UTI  - 1/29 BCx/UCx E. Coli - s/p Levo x 3 weeks (thru stent removal, 3/15) (S: Cefazolin, Cipro/Levo, Pip-tazo, Cephalexin; R: TMP-SMX, Amp, Amp-sulb)  - 3/14 BCx/UCx ESBL/FQ-R E. Coli (S: Erta, Gent, Mero, Tobra, Cefepime SDD; I: ceftaz; R: Cipro/Levo, Cefazolin, Ceftriaxone, Amp) s/p 14 days Meropenem   - 4/13 Bcx/Ucx E. Coli (R CFZ, CTX, cipro/levo, I ceftaz, SDD CFP, susc nitro) s/p mero x 2 weeks  - 5/7 seen by urogyn, no structural issues planned to stop nitrofurantoin with switch to methenamine (not done), added vaginal estrogen/d-mannose.  - 6/3 UCx <10,000 E. Coli (S: Cefepime SDD; Erta/Mero, Gent, Nitrofurantoin, Tobra) s/p ertapenem until 01/15/23  - 4/29 started nitrofurantoin prophylaxis; not restarted after 6/24 due to reduced GFR and breakthrough in 12/2022     Active infection:  #Perinephric fluid collection 10/05/22, slowly improving 01/27/23  - 3/19 IR guided drainage negative cx, Cr 1.5 not suggestive of urinoma  - 4/13 increased stranding, 4/16 not amenable to intervention per VIR  - 01/27/23 CT a/p 7.4 x 1.0 x 5.4 cm crescentic fluid collection within the soft tissues lateral to the transplant kidney (8.3 x 0.9 x 5.5 cm on 11/05/22)     # Fever and septic shock, presumed urinary source given pyuria and recurrent UTI history 01/27/23  - 7/6 3x emesis in the ED with sudden acute change in mental status, prompting intubation for airway protection --> extubated 7/7  - 7/6 flu/RSV/covid neg; UCx <1,000 CFU (appears on plates as E. Coli); BCx (NGTD)  - 7/6 CXR more likely edema or aspiration then pneumonia  - 7/6 CT A/P: Bibasilar consolidation. No hydronephrosis. No solid renal mass. Atrophic bilateral  native kidneys. Similar appearance of nonobstructive punctate calculi and vascular calcifications. Subcentimeter hypoattenuating lesions within the renal transplant, too small to characterize. No hydronephrosis. Similar inflammatory stranding surrounding the transplant kidney, particularly around the hilum. 7.4 x 1.0 x 5.4 cm crescentic fluid collection within the soft tissues lateral to the transplant kidney (8.3 x 0.9 x 5.5 cm on 11/05/22)  - 7/7 renal transplant [place]: The renal transplant was located in the left lower quadrant. Normal size and echogenicity. 2 subcentimeter anechoic cysts are present. There is pelvic caliectasis.   Rx 7/6 vanc/cefepime -> 7/7 Vanc, mero--> 7/8 meropenem        Antimicrobial allergy/intolerance:   Penicillin (see allergy list for nuances)  Valacyclovir - nausea         RECOMMENDATIONS    Diagnosis  F/u 7/6 BCx (NGTD)  F/u 7/7 CMV/EBV PCR  We reviewed urine cx with micro today--just a bit of e. Coli but not enough to work-up further    Management  Continue meropenem for now, can discharge on ertapenem unless we have micro data suggesting otherwise  Hard to know if this is just reinfection or a persistent reservoir (?tiny calculi or cysts) but we may need to leave her on long-term IV abx for months if we cannot find an appropriate oral prophylaxis agent  Also hard to know if the perinephric fluid collection was seeded with any of these episodes--it's unchanged in size and unfortunately too small to sample safely per prior assessment by VIR.    Antimicrobial prophylaxis required for transplant immunosuppression   Continue tmp-smx  (END: 02/04/23)  Resolved HBV: obtain LFTs monthly and HBV DNA and SAg for any rise in LFTs    Intensive toxicity monitoring for prescription antimicrobials   CBC w/diff at least once per week  CMP at least once per week  clinical assessments for rashes or other skin changes    The [facility] ID APP service will continue to follow.           Please page Darolyn Rua, NP at [phone] from 8-4:30pm, after 4:30 pm & on weekends, please page the ID Transplant/Liquid Oncology Fellow consult at [phone] with questions.    Patient discussed with Dr. Michiel Cowboy.      Darolyn Rua, MSN, APRN, AGNP-C  Immunocompromised Infectious Disease Nurse Practitioner    I personally spent 15 minutes face-to-face and non-face-to-face in the care of this patient, which includes all pre, intra, and post visit time on the date of service.  All documented time was specific to the E/M visit and does not include any procedures that may have been performed.      Subjective:     External record(s): Primary team note: reviewed and noted patient stable off pressor support .    Independent historian(s): no independent historian required.       Interval History:   T-max 39.3 overnight. Patient denies trouble breathing, n/v/d. States no urinary symptoms prior to admission    Medications:  Current Medications as of 01/29/2023  Scheduled  PRN   atorvastatin, 10 mg, Daily  insulin lispro, 0-20 Units, ACHS  levothyroxine, 112 mcg, daily  meropenem, 500 mg, Q8H  mycophenolate, 250 mg, BID  nystatin, 500,000 Units, Q8H  predniSONE, 5 mg, Daily  sulfamethoxazole-trimethoprim, 1 tablet, Q MWF  tacrolimus, 0.5 mg, Nightly  tacrolimus, 1 mg, Daily      acetaminophen, 1,000 mg, Q8H PRN  aluminum-magnesium hydroxide-simethicone, 30 mL, Q4H PRN  dextrose in water, 12.5 g, Q10 Min PRN  glucagon,  1 mg, Once PRN  glucose, 16 g, Q10 Min PRN  guaiFENesin, 200 mg, Q4H PRN  melatonin, 3 mg, Nightly PRN  ondansetron, 4 mg, Q6H PRN  senna, 2 tablet, Nightly PRN         Objective:     Vital Signs last 24 hours:  Temp:  [37.1 ??C (98.8 ??F)-37.3 ??C (99.1 ??F)] 37.1 ??C (98.8 ??F)  Core Temp:  [37.4 ??C (99.3 ??F)-39.3 ??C (102.7 ??F)] 37.6 ??C (99.7 ??F)  Heart Rate:  [69-92] 78  SpO2 Pulse:  [69-92] 77  Resp:  [16-32] 21  BP: (146-190)/(20-119) 163/31  MAP (mmHg):  [69-141] 82  SpO2:  [95 %-100 %] 100 %    Physical Exam:   Patient Lines/Drains/Airways Status       Active Active Lines, Drains, & Airways       Name Placement date Placement time Site Days    CVC Triple Lumen 01/28/23 Non-tunneled Left Internal jugular 01/28/23  0016  Internal jugular  1    Urethral Catheter Temperature probe 16 Fr. 01/27/23  2031  Temperature probe  1    Arteriovenous Fistula - Vein Graft  Access 08/24/16 Left;Upper Arm 08/24/16  --  Arm  2349                  Const [x]  vital signs above    []  NAD, non-toxic appearance []  Chronically ill-appearing, non-distressed  Laying in bed      Eyes [x]  Lids normal bilaterally, conjunctiva anicteric and noninjected OU     [] PERRL  [] EOMI        ENMT [x]  Normal appearance of external nose and ears, no nasal discharge        [x]  MMM, no lesions on lips or gums [x]  No thrush, leukoplakia, oral lesions  []  Dentition good []  Edentulous []  Dental caries present  []  Hearing normal  []  TMs with good light reflexes bilaterally         Neck [x]  Neck of normal appearance and trachea midline        []  No thyromegaly, nodules, or tenderness   []  Full neck ROM        Lymph []  No LAD in neck     []  No LAD in supraclavicular area     []  No LAD in axillae   []  No LAD in epitrochlear chains     []  No LAD in inguinal areas        CV [x]  RRR            []  No peripheral edema     []  Pedal pulses intact   []  No abnormal heart sounds appreciated   [x]  Extremities WWP   Murmur present      Resp [x]  Normal WOB at rest    []  No breathlessness with speaking, no coughing  [x]  CTA anteriorly    []  CTA posteriorly          GI [x]  Normal inspection, NTND   []  NABS     []  No umbilical hernia on exam       []  No hepatosplenomegaly     []  Inspection of perineal and perianal areas normal        GU []  Normal external genitalia     [] No urinary catheter present in urethra   []  No CVA tenderness    []  No tenderness over renal allograft  Foley      MSK []  No clubbing or cyanosis of hands       []   No vertebral point tenderness  []  No focal tenderness or abnormalities on palpation of joints in RUE, LUE, RLE, or LLE        Skin [x]  No rashes, lesions, or ulcers of visualized skin     []  Skin warm and dry to palpation   LIJ with dressing c/d/i      Neuro [x]  Face expression symmetric  []  Sensation to light touch grossly intact throughout    []  Moves extremities equally    []  No tremor noted        []  CNs II-XII grossly intact     []  DTRs normal and symmetric throughout []  Gait unremarkable        Psych [x]  Appropriate affect       []  Fluent speech [x]  Attentive, good eye contact  [x]  Oriented to person, place, time          []  Judgment and insight are appropriate           Data for Medical Decision Making     01/27/23 EKG QTcF    I discussed mgm't w/qualified health care professional(s) involved in case: discussed with primary team about possible aspiration of fluid collection .    I reviewed CBC results (WBC WNL; Platelets decreased), chemistry results (creatinine slightly improved), and micro result(s) (Urine culture neg; blood culture no growth 24 hr).    I independently visualized/interpreted not done.       Recent Labs     Units 01/27/23  1556 01/27/23  2004 01/28/23  0108 01/28/23  0422 01/29/23  0531   WBC 10*9/L 11.5*  --  9.6  --  6.8   HGB g/dL 09.8*  --  8.3*  --  8.1*   PLT 10*9/L 78*  --  60*  --  55*   NEUTROABS 10*9/L 10.5*  --   --   --   --    LYMPHSABS 10*9/L 0.4*  --   --   --   --    EOSABS 10*9/L 0.0  --   --   --   --    BUN mg/dL 44*   < > 43*   < > 39*   CREATININE mg/dL 1.19*   < > 1.47*   < > 1.80*   AST U/L 12  --  11  --   --    ALT U/L 11  --  8*  --   --    BILITOT mg/dL 0.6  --  0.4  --   --    ALKPHOS U/L 57  --  43*  --   --    K mmol/L 4.1   < > 3.8 - 3.7   < > 4.0   MG mg/dL  --   --   --    < > 1.4*   CALCIUM mg/dL 9.5   < > 8.2*   < > 8.5*    < > = values in this interval not displayed.     Lab Results   Component Value Date    TACROLIMUS 1.9 (L) 01/29/2023         Microbiology:  Microbiology Results (last day)       Procedure Component Value Date/Time Date/Time    Urine Culture [[id number]]  (Normal) Collected: 01/27/23 2033    Lab Status: Final result Specimen: Urine from Clean Catch Updated: 01/29/23 0814     Urine Culture, Comprehensive NO SIGNIFICANT GROWTH: <1000 CFU/mL    Narrative:  Specimen Source: Clean Catch    Blood Culture [[id number]]  (Normal) Collected: 01/27/23 1749    Lab Status: Preliminary result Specimen: Blood from 1 Peripheral Draw Updated: 01/28/23 1815     Blood Culture, Routine No Growth at 24 hours    Blood Culture [[id number]]  (Normal) Collected: 01/27/23 1741    Lab Status: Preliminary result Specimen: Blood from 1 Peripheral Draw Updated: 01/28/23 1815     Blood Culture, Routine No Growth at 24 hours           Imaging:  No new imaging

## 2023-01-29 NOTE — Unmapped (Signed)
No acute events this shift. Pt remains calm with no signs of acute distress, VSS. Remains in NSR on cardiac monitor. Able to maintain oxygen saturations greater than 93% on 2L [place]. Urine output via Foley, making adequate amounts of clear yellow urine. Remains free of fall/injury. See notes and flowsheets for further details.      Problem: Adult Inpatient Plan of Care  Goal: Plan of Care Review  Outcome: Progressing  Goal: Patient-Specific Goal (Individualized)  Outcome: Progressing  Goal: Absence of Hospital-Acquired Illness or Injury  Outcome: Progressing  Intervention: Identify and Manage Fall Risk  Recent Flowsheet Documentation  Taken 01/29/2023 0000 by Kevan Ny, RN  Safety Interventions:   aspiration precautions   bariatric safety   bleeding precautions   commode/urinal/bedpan at bedside   fall reduction program maintained   lighting adjusted for tasks/safety   low bed  Intervention: Prevent Skin Injury  Recent Flowsheet Documentation  Taken 01/29/2023 0600 by Kevan Ny, RN  Positioning for Skin: Left  Taken 01/29/2023 0400 by Kevan Ny, RN  Positioning for Skin: Right  Taken 01/29/2023 0200 by Kevan Ny, RN  Positioning for Skin: Left  Taken 01/29/2023 0000 by Kevan Ny, RN  Positioning for Skin: Right  Device Skin Pressure Protection:   absorbent pad utilized/changed   adhesive use limited   positioning supports utilized   pressure points protected  Skin Protection:   adhesive use limited   cleansing with dimethicone incontinence wipes   pulse oximeter probe site changed   silicone foam dressing in place   transparent dressing maintained  Intervention: Prevent and Manage VTE (Venous Thromboembolism) Risk  Recent Flowsheet Documentation  Taken 01/29/2023 0600 by Kevan Ny, RN  Anti-Embolism Intervention: Other (Comment)  Taken 01/29/2023 0400 by Kevan Ny, RN  Anti-Embolism Intervention: Other (Comment)  Taken 01/29/2023 0200 by Kevan Ny, RN  Anti-Embolism Intervention: Other (Comment)  Taken 01/29/2023 0000 by Kevan Ny, RN  VTE Prevention/Management: anticoagulant therapy  Anti-Embolism Intervention: Other (Comment)  Intervention: Prevent Infection  Recent Flowsheet Documentation  Taken 01/29/2023 0000 by Kevan Ny, RN  Infection Prevention:   cohorting utilized   environmental surveillance performed   equipment surfaces disinfected   hand hygiene promoted   personal protective equipment utilized   rest/sleep promoted  Goal: Optimal Comfort and Wellbeing  Outcome: Progressing  Goal: Readiness for Transition of Care  Outcome: Progressing  Goal: Rounds/Family Conference  Outcome: Progressing

## 2023-01-29 NOTE — Unmapped (Signed)
MICU Daily Progress Note     Date of Service: 01/29/2023    Problem List:   Principal Problem:    Sepsis with encephalopathy without septic shock (CMS-HCC)  Active Problems:    Type 2 diabetes mellitus with diabetic nephropathy, with long-term current use of insulin (CMS-HCC)    End-stage renal disease (CMS-HCC)    Kidney transplant recipient    Anemia due to stage 5 chronic kidney disease, not on chronic dialysis (CMS-HCC)    Immunosuppressed status (CMS-HCC)    Hypertension    Hyperlipidemia    Chronic idiopathic thrombocytopenia (CMS-HCC)    Acute metabolic encephalopathy    AKI (acute kidney injury) (CMS-HCC)    Acute metabolic acidosis  Resolved Problems:    * No resolved hospital problems. *      Interval history: Julie [name] is a 72 y.o. 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 C/f sepsis 2/2 UTI, on meropenem.      24hr events:   - Febrile to 39, nontachycardic. Weaned off pressor  - Extubated to 2L [place]  - Continues on meropenem, dec WBC and lactate normal  - Mental status improving, A&Ox4    Neurological   AMS - Acute metabolic encephalopathy w/ fever (improving)  Known history of AMS with fevers, see prior admission notes (Jan, Feb, March, Apr, June). Now weaned off sedation following extubation. A&Ox4 this AM.    Analgesia: No pain issues  RASS at goal? No - adjusting sedation or order to reflect need  [place] Agitation Assessment Scale (RASS) : 0 (01/29/2023 12:00 AM)       Pulmonary   S/p Intubation   Intubated in ED due to concerns of airway protection related to emesis x3 and sudden onset AMS. Extubated 7/7 to [place]. Most recently on 2L [place]. CXR 7/7 with mild bilateral hazy interstitial opacities c/f interstitial pulmonary edema.  - Wean O2 as tolerated     FiO2 (%):  [30 %-40 %] 30 %  PR SUP:  [5 cm H20-10 cm H20] 10 cm H20  O2 Device: None (Room air)  O2 Flow Rate (L/min):  [2 L/min] 2 L/min    Cardiovascular   Hypertension  Elevated BP since admission with MAP in 80s. Holding home meds below in setting of acute illness and concern for bacteremia. SBP to 190; however, not sustained. Most recently, SBP 150s. Will continue to hold home regimen.  - HOLD home spironolactone 25mg  daily  - HOLD home chlorthalidone 25mg  daily  - HOLD home carvedilol 25 mg daily  - HOLD home amlodipine 5mg  BID, resume if persistently hypertensive per neprho  - HOLD home hydralazine 50mg  BID, resume if persistently hypertensive per neprho    HLD: continue home atorvastatin 10 mg daily     Renal   S/p L DDKT 08/06/22 2/2 diabetic nephropathy   Follows with Dr. Elvera Maria. Lowest Cr ~0.95 after DDKT, presumed new baseline between 0.9-1.2. Cr 1.3 on 7/6 admission, now uptrended to 1.92 with minimal UOP. Transplant renal US 7/7 with dec peak systolic velocity of the main renal artery at the anastomosis with overall decreased resistive indices when compared to 12/25/2022. Thin, heterogeneous collection lateral to the transplant kidney, may reflect residua of the perinephric collection noted on prior studies. Urine sediment consistent with mild tubular injury and UTI. Most recently, Cr 1.8 and  maintaining appropriate UOP.   - Strict I/Os via Foley  - BMP, Phos, Mag  - Tacrolimus 1g AM and 0.5mg  PM   - Tac trough MWF, goal 5-7   - Continue home prednisone 5mg  daily.   - Reduce cellcept 250mg  BID in setting of infection of unknown origin (home cellcept 500mg  BID)   - HOLD alendronate 70mg  weekly while intubated  - HOLD Vit D daily while intubated  - Transplant nephrology following    Infectious Disease/Autoimmune   Fever in IC host - Lactic Acidosis - C/f bacteremia   Presented febrile with WBC elevated at 11.5 and lactate to 3.1. Has had 5 admissions this year with similar presentation of AMS and fever found with ESBL E.coli bacteremia 2/2 MDR UTI, see above for further hx. Most recent course of ertapenem completed 01/15/23 for presumed graft pyelonephritis in absence of clear infectious source. Received vancomycin and cefepime in ED. Transitioned to IV meropenem based on prior susceptibilities on arrival to MICU. Pursued comprehensive infectious workup for IC patient with fever. CXR unremark, UA concerning for UTI. Ucx no significant growth. Bcx no growth at 24h. CTAP with stable to mildly decreased size of the crescentic fluid collection within the overlying soft tissues measuring up to 7.4 cm in size lateral to transplant kidney. Limited evaluation for abscess given absence of IV contrast.     ON febrile to 39.3, nontachycardic, and off pressors. WBC dec to 6.8, lactate normal. ICID consulted, continuing meropenem given prior UTI susceptibilities.   - Cont IV meropenem (7/6 - ). May consider transition to ertapenem at discharge.  - Start nystatin SS 5ml tid  - Daily CBC/ diff  - ICID c/s  - F/up Bcx    - Prophlyaxis:  - Continue TMP+SMX renally dosed equivalent 400/80 MWF x 6 months (end 02/04/2023)  - Has completed valcyte for 3 months  - Resolved HBV: obtain LFTs monthly and HBV DNA and SAg for any rise in LF     Cultures:  Blood Culture, Routine (no units)   Date Value   01/27/2023 No Growth at 24 hours   01/27/2023 No Growth at 24 hours     WBC   Date Value   01/29/2023 6.8 10*9/L   01/22/2023 6.3   01/16/2023 3.4 x10E3/uL     WBC Clumps (/HPF)   Date Value   01/27/2023 Few (A)     WBC, UA (/HPF)   Date Value   01/27/2023 >182 (H)          FEN/GI   Chronic constipation  Home bowel regimen senna daily and colace 100mg  PRN.  - Continue daily senna  - Colace PRN    Nutrition:  - PO diet    Provider Malnutrition Assessment:  Body mass index is 23.42 kg/m??.           Heme/Coag   Acute on Chronic Thrombocytopenia  Known chronic thrombocytopenia ~110-130, presented with platelets of 73. Of note, patient has had similar drop in platelets during previous admissions for bacteremia with resolution back to baseline. Suspect drop is in response to acute illness. Plts stable at 60. Plt stable 55.  - F/up Blood smear  - Trend Plt on daily CBC     Anemia on CKD  Baseline Hgb ~10. Presenting Hgb 10.3. Iron panel and ferritin at prior June admission consistent with AoCD. If acute drop, will repeat anemia workup. Hgb dec to 8.3, most likely dilutional iso 4L LR. Hgb stable 8.1  - Trend Hgb  on daily CBC    Endocrine   T2DM, insulin-dependent  Most recent 12/07/22 A1C 5.8. BG in range.   - SSI    Hypothyroidism: Continue home synthroid 112 mcg daily    Integumentary   NAI    Prophylaxis/LDA/Restraints/Consults   ICU Checklist completed: yes (see ICU rounding navigator in Epic)    Patient Lines/Drains/Airways Status       Active Active Lines, Drains, & Airways       Name Placement date Placement time Site Days    CVC Triple Lumen 01/28/23 Non-tunneled Left Internal jugular 01/28/23  0016  Internal jugular  1    Urethral Catheter Temperature probe 16 Fr. 01/27/23  2031  Temperature probe  1    Peripheral IV 01/27/23 Anterior;Proximal;Right Antecubital 01/27/23  1555  Antecubital  1    Arteriovenous Fistula - Vein Graft  Access 08/24/16 Left;Upper Arm 08/24/16  --  Arm  2349                  Patient Lines/Drains/Airways Status       Active Wounds       None                    Goals of Care     Code Status: Full Code    Designated Healthcare Decision Maker:  Ms. Bowron's current decisional capacity for healthcare decision-making is Full capacity. Her designated Educational psychologist) is/are   HCDM (patient stated preference) (Active): Nole,[name] - Spouse - [phone].      Subjective     Denies pain. A&Ox4. Mental status improving; however, still not at baseline per family.    Objective     Vitals - past 24 hours  Temp:  [37.2 ??C (99 ??F)-37.5 ??C (99.5 ??F)] 37.3 ??C (99.1 ??F)  Heart Rate:  [64-92] 92  SpO2 Pulse:  [65-92] 92  Resp:  [18-32] 28  BP: (118-190)/(20-119) 190/119  FiO2 (%):  [30 %-40 %] 30 %  SpO2:  [95 %-100 %] 97 % Intake/Output  I/O last 3 completed shifts:  In: 1341.5 [I.V.:241.5; IV Piggyback:1100]  Out: 1130 [Urine:1130]     Physical Exam:   General: ill-appearing, sitting upright in bed   HEENT: atraumatic, normocephalic   CV: RRR  Pulm: CTAB  GI: soft, NTND  MSK: no LE edema  Neuro: A&Ox4      Continuous Infusions:         Scheduled Medications:    atorvastatin  10 mg Oral Daily    insulin lispro  0-20 Units Subcutaneous ACHS    levothyroxine  112 mcg Oral daily    meropenem  500 mg Intravenous Q12H [facility]    mycophenolate  250 mg Oral BID    NORepinephrine bitartrate-D5W        nystatin  500,000 Units Oral Q8H    predniSONE  5 mg Oral Daily    sulfamethoxazole-trimethoprim  1 tablet Oral Q MWF    tacrolimus  0.5 mg Oral Nightly    tacrolimus  1 mg Oral Daily       PRN medications:  acetaminophen, aluminum-magnesium hydroxide-simethicone, dextrose in water, glucagon, glucose, guaiFENesin, melatonin, NORepinephrine bitartrate-D5W, ondansetron, senna    Data/Imaging Review: Reviewed in Epic and personally interpreted on 01/29/2023. See EMR for detailed results.

## 2023-01-29 NOTE — Unmapped (Signed)
IMMUNOCOMPROMISED HOST INFECTIOUS DISEASE CONSULT NOTE    Julie [name] is being seen in consultation at the request of Montine Circle, MD for evaluation of sepsis.    Assessment/Recommendations:    Kalianne Davidson is a 72 y.o. 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 Had perinephric drain in place postoperatively, removed 08/30/22  - Immunosuppression: Tacrolimus (goal 5-7), MMF 500 BID (reduced 250mg  bid on 7/6) Pred 5  - Prophylaxis: valganciclovir x 3 months (mod risk, completed), TMP+SMX x 6 months  - baseline Cr ~1.1  - AKI 01/27/23 Estimated Creatinine Clearance: 18.3 mL/min (A) (based on SCr of 1.97 mg/dL (H)).     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 Dubliensis 08/05/22, <10K, negative recipient samples, negative donor blood cx, treated with 2 weeks PO fluconazole  # Positive Hep B sAg and core antibodies, c/w resolved infection, with moderate risk for reactivation 02/2021  - 12/28/22 HBV DNA not detected and HBsAg pending  # Recurrent MDR E.Coli Bacteremia/UTI  - 1/29 BCx/UCx E. Coli - s/p Levo x 3 weeks (thru stent removal, 3/15) (S: Cefazolin, Cipro/Levo, Pip-tazo, Cephalexin; R: TMP-SMX, Amp, Amp-sulb)  - 3/14 BCx/UCx ESBL/FQ-R E. Coli (S: Erta, Gent, Mero, Tobra, Cefepime SDD; I: ceftaz; R: Cipro/Levo, Cefazolin, Ceftriaxone, Amp) s/p 14 days Meropenem   - 4/13 Bcx/Ucx E. Coli (R CFZ, CTX, cipro/levo, I ceftaz, SDD CFP, susc nitro) s/p mero x 2 weeks  - 5/7 seen by urogyn, no structural issues planned to stop nitrofurantoin with switch to methenamine (not done), added vaginal estrogen/d-mannose.  - 6/3 UCx <10,000 E. Coli (S: Cefepime SDD; Erta/Mero, Gent, Nitrofurantoin, Tobra) s/p ertapenem until 01/15/23  - 4/29 started nitrofurantoin prophylaxis; not restarted after 6/24 due to reduced GFR and breakthrough in 12/2022    Active infection:  #Perinephric fluid collection 10/05/22, slowly improving 01/27/23  - 3/19 IR guided drainage negative cx, Cr 1.5 not suggestive of urinoma  - 4/13 increased stranding, 4/16 not amenable to intervention per VIR  - 01/27/23 CT a/p 7.4 x 1.0 x 5.4 cm crescentic fluid collection within the soft tissues lateral to the transplant kidney (8.3 x 0.9 x 5.5 cm on 11/05/22)    # Fever and septic shock, presumed urinary source given pyuria and recurrent UTI history 01/27/23  - 7/6 3x emesis in the ED with sudden acute change in mental status, prompting intubation for airway protection --> extubated 7/7  - 7/6 flu/RSV/covid negative  - 7/6 CXR more likely edema or aspiration then pneumonia  -  7/6 bd cx pending  - 7/6 UA wbc >182, Ucx pending  - 7/6 CT a/p without contrast: Bibasilar consolidation. No hydronephrosis.  No solid renal mass. Atrophic bilateral native kidneys. Similar appearance of nonobstructive punctate calculi and vascular calcifications. Subcentimeter hypoattenuating lesions within the renal transplant, too small to characterize. No hydronephrosis. Similar inflammatory stranding surrounding the transplant kidney, particularly around the hilum. 7.4 x 1.0 x 5.4 cm crescentic fluid collection within the soft tissues lateral to the transplant kidney (8.3 x 0.9 x 5.5 cm on 11/05/22)  - 7/7 renal transplant [place]: The renal transplant was located in the left lower quadrant. Normal size and echogenicity. 2 subcentimeter anechoic cysts are present. There is pelvic caliectasis.   Rx 7/6 vanc/cefepime -> 7/6 mero    # Tongue coating vs thrush 01/28/23    Antimicrobial allergy/intolerance:   Penicillin (see allergy list for nuances)  Valacyclovir - nausea       RECOMMENDATIONS    Diagnosis  Follow up blood and urine cultures    Management  Continue meropenem  Hard to know if this is just reinfection or a persistent reservoir (?tiny calculi or cysts) but we may need to leave her on long-term IV abx for months if we cannot find an appropriate oral prophylaxis agent  Also hard to know if the perinephric fluid collection was seeded with any of these episodes. We could consider repeat aspiration or continuing abx until it has entirely resolved.   Start nystatin SS 5ml tid    Antimicrobial prophylaxis required for host deficiency: transplant immunosuppression  Due to complete tmp-smx ppx on 02/04/23  Resolved HBV: obtain LFTs monthly and HBV DNA and SAg for any rise in LFTs    Intensive toxicity monitoring for prescription antimicrobials   CBC w/diff at least TWICE per week  CMP at least once per week    The [facility] ID service will continue to follow.           Please page the ID Transplant/Liquid Oncology Fellow consult at [phone] with questions.    Ephraim Hamburger, MD  [facility] Division of Infectious Diseases    History of Present Illness:      External record(s): Consultant note(s): tunneled line removed 01/26/23 .    Independent historian(s): no independent historian required.       I reviewed the ICU HPI:  7/6 developed fever and chills so husband brought her to the ED.   In ED she has MS change accompanied by vomiting  She has a known history of encephalopathy with fever    She doesn't endorse any complaints at this time. She denies have dysuria prior to admission as well as current abdominal tenderness and allograph tenderness. She is extubated and off all pressors. She altered and able to ask were her husband is and for me to turn on the TV but I do not know if she is completely back to baseline.    Allergies:  Allergies   Allergen Reactions    Gabapentin Other (See Comments)     Shakes    Valacyclovir Nausea Only and Other (See Comments)       Medications:   Antimicrobials:  Anti-infectives (From admission, onward)      Start     Dose/Rate Route Frequency Ordered Stop    01/29/23 1100  sulfamethoxazole-trimethoprim (BACTRIM) 400-80 mg tablet 80 mg of trimethoprim         1 tablet Oral Every Mon-Wed-Fri 01/28/23 1517 02/05/23 0859    01/27/23 2112  meropenem (MERREM) 500 mg in sodium chloride 0.9 % (NS) 100 mL IVPB-MBP         500 mg  200 mL/hr over 30 Minutes Intravenous Every 12 hours scheduled 01/27/23 2050 02/10/23 2059            Current/Prior immunomodulators per problem list. No change since admission.    Other medications reviewed.     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)     since July 2017    Hypertension     Hypothyroidism (acquired)     Kidney transplant status, cadaveric 08/08/2022    Type 2 diabetes mellitus (CMS-HCC)          Past Surgical History:   Procedure Laterality Date    CATARACT EXTRACTION      HYSTERECTOMY      OOPHORECTOMY      PERITONEAL CATHETER INSERTION      PR LAP INSERTION TUNNELED INTRAPERITONEAL CATHETER N/A 09/27/2018    Procedure: LAPAROSCOPY, SURGICAL; WITH INSERTION OF INTRAPERITONEAL CANNULA OR CATHETER, PERMANENT;  Surgeon: Leona Carry, MD;  Location: MAIN OR [place];  Service: Transplant    PR LAP INSERTION TUNNELED INTRAPERITONEAL CATHETER N/A 04/08/2019    Procedure: LAPAROSCOPY, SURGICAL; WITH INSERTION OF INTRAPERITONEAL CANNULA OR CATHETER, PERMANENT;  Surgeon: Leona Carry, MD;  Location: MAIN OR [place];  Service: Transplant    PR LAP REVISE INTRAPERITONEAL CATHETER N/A 08/26/2019    Procedure: LAPAROSCOPY, SURGICAL; W/REVIS PREV PLACED INTRAPERITONEAL CANNULA/CATH, REMOV INTRALUMIN OBSTRUCT MATERIAL;  Surgeon: Leona Carry, MD;  Location: MAIN OR [facility];  Service: Transplant    PR REDUCE VOLVULUS,INTUSS,INTERN HERNIA N/A 10/13/2022    Procedure: REDUCTION OF VOLVULUS, INTUSSUSCEPTION, INTERNAL HERNIA, BY LAPAROTOMY;  Surgeon: Suella Broad, MD;  Location: MAIN OR [place];  Service: Trauma    PR REMOVAL TUNNELED INTRAPERITONEAL CATHETER N/A 05/06/2018    Procedure: REMOVAL OF PERMANENT INTRAPERITONEAL CANNULA OR CATHETER;  Surgeon: Leona Carry, MD;  Location: MAIN OR [facility];  Service: Transplant    PR REMOVE PERITONEAL FOREIGN BODY N/A 04/08/2019    Procedure: Removal Of Peritoneal Of Foreign Body From Peritoneal Cavity;  Surgeon: Leona Carry, MD;  Location: MAIN OR [facility];  Service: Transplant    PR TRANSPLANT,PREP RENAL GRAFT/ARTERIAL Right 08/06/2022    Procedure: [facility] RECONSTRUCTION CADAVER/LIVING DONOR RENAL ALLOGRAFT PRIOR TO TRANSPLANT; ARTERIAL ANASTOMOSIS EAC;  Surgeon: Toledo, Lilyan Punt, MD;  Location: MAIN OR [facility];  Service: Transplant    PR TRANSPLANTATION OF KIDNEY Right 08/06/2022    Procedure: RENAL ALLOTRANSPLANTATION, IMPLANTATION OF GRAFT; WITHOUT RECIPIENT NEPHRECTOMY;  Surgeon: Toledo, Lilyan Punt, MD;  Location: MAIN OR [facility];  Service: Transplant    TUBAL LIGATION           Social History:  Tobacco use:   reports that she quit smoking about 16 years ago. Her smoking use included cigarettes. She started smoking about 46 years ago. She has a 22.5 pack-year smoking history. She has quit using smokeless tobacco.   Alcohol use:    reports current alcohol use.   Drug use:    reports no history of drug use.   Living situation:  Lives with spouse/partner   Residence:   [place]     Family History:  Family History   Problem Relation Age of Onset    Diabetes Mother  Alzheimer's disease Father     Diabetes Sister     Diabetes Sister     Diabetes Sister     Anesthesia problems Neg Hx     Bleeding Disorder Neg Hx             Vital Signs last 24 hours:  Temp:  [37.2 ??C (99 ??F)-37.7 ??C (99.9 ??F)] 37.3 ??C (99.1 ??F)  Core Temp:  [37.1 ??C (98.8 ??F)-40 ??C (104 ??F)] 38.2 ??C (100.8 ??F)  Heart Rate:  [64-109] 83  SpO2 Pulse:  [65-107] 83  Resp:  [9-35] 27  BP: (80-192)/(17-130) 180/30  MAP (mmHg):  [42-133] 89  FiO2 (%):  [30 %-50 %] 30 %  SpO2:  [95 %-100 %] 97 %  BMI (Calculated):  [23.42] 23.42    Physical Exam:  Patient Lines/Drains/Airways Status       Active Active Lines, Drains, & Airways       Name Placement date Placement time Site Days    CVC Triple Lumen 01/28/23 Non-tunneled Left Internal jugular 01/28/23  0016  Internal jugular  less than 1    Urethral Catheter Temperature probe 16 Fr. 01/27/23  2031  Temperature probe  less than 1    Peripheral IV 01/27/23 Anterior;Proximal;Right Antecubital 01/27/23  1555  Antecubital  1    Arteriovenous Fistula - Vein Graft  Access 08/24/16 Left;Upper Arm 08/24/16  --  Arm  2348                  Const [x]  vital signs above    [x]  NAD, non-toxic appearance []  Chronically ill-appearing, non-distressed        Eyes [x]  Lids normal bilaterally, conjunctiva anicteric and noninjected OU     [] PERRL  [] EOMI        ENMT [x]  Normal appearance of external nose and ears, no nasal discharge        []  MMM, no lesions on lips or gums []  No thrush, leukoplakia, oral lesions  []  Dentition good []  Edentulous []  Dental caries present  []  Hearing normal  []  TMs with good light reflexes bilaterally         Neck []  Neck of normal appearance and trachea midline        []  No thyromegaly, nodules, or tenderness   []  Full neck ROM        Lymph []  No LAD in neck     []  No LAD in supraclavicular area     []  No LAD in axillae   []  No LAD in epitrochlear chains     []  No LAD in inguinal areas        CV [x]  RRR            [x]  No peripheral edema     []  Pedal pulses intact   []  No abnormal heart sounds appreciated   []  Extremities WWP   Loud systolic murmur  Left CVL c/d/l  I did not take down the dressing at the old tunnel line site on the right chest but no surrounding erythema      Resp [x]  Normal WOB at rest    [x]  No breathlessness with speaking, no coughing  [x]  CTA anteriorly    []  CTA posteriorly    On nasal canula      GI [x]  Normal inspection, NTND   []  NABS     []  No umbilical hernia on exam       []  No hepatosplenomegaly     []  Inspection  of perineal and perianal areas normal        GU []  Normal external genitalia     [] No urinary catheter present in urethra   []  No CVA tenderness    [x]  No tenderness over left renal allograft  +urinary cath  No bladder TTP      MSK []  No clubbing or cyanosis of hands       []  No vertebral point tenderness  []  No focal tenderness or abnormalities on palpation of joints in RUE, LUE, RLE, or LLE  No obvs swollen joints at elbows, knees, ankles      Skin []  No rashes, lesions, or ulcers of visualized skin     []  Skin warm and dry to palpation   Stasis dermatitis on shins b/l      Neuro []  Face expression symmetric  []  Sensation to light touch grossly intact throughout    [x]  Moves extremities equally    [x]  No tremor noted        []  CNs II-XII grossly intact     []  DTRs normal and symmetric throughout []  Gait unremarkable        Psych [x]  Appropriate affect       []  Fluent speech         []  Attentive, good eye contact  []  Oriented to person, place, time          []  Judgment and insight are appropriate   She follows commands and answers some but not all questions appropriately        Data for Medical Decision Making     01/27/23 EKG QTcF    I discussed mgm't w/qualified health care professional(s) involved in case: antimicrobial plan with the micu team .    I reviewed CBC results (wbc coming down), chemistry results (Cr above baseline, ha1c controlled), micro result(s) (UA+ but neg culture and viral studies), radiology report(s) (no obvious pna, no hydronephrosis), and ECG report (tachy but qtc ok).    I independently visualized/interpreted not done.       Recent Labs     Units 01/27/23  1556 01/27/23  2004 01/28/23  0108 01/28/23  0422 01/28/23  1801   WBC 10*9/L 11.5*  --  9.6  --   --    HGB g/dL 40.9*  --  8.3*  --   --    PLT 10*9/L 78*  --  60*  --   --    NEUTROABS 10*9/L 10.5*  --   --   --   --    LYMPHSABS 10*9/L 0.4*  --   --   --   --    EOSABS 10*9/L 0.0  --   --   --   --    NA mmol/L 141   < > 141 - 139   < > 141   K mmol/L 4.1   < > 3.8 - 3.7   < > 4.0   BUN mg/dL 44*   < > 43*  --  43*   CREATININE mg/dL 8.11*   < > 9.14*  --  1.97*   GLU mg/dL 782*   < > 956*  --  213   CALCIUM mg/dL 9.5   < > 8.2*  --  8.3*   MG mg/dL  --   --   --   --  1.4*   BILITOT mg/dL 0.6  --  0.4  --   --    AST U/L 12  --  11  --   --    ALT U/L 11  --  8*  --   --    IGG mg/dL  --   --  161*  --   --    A1C % 5.7*  --   --   --   --     < > = values in this interval not displayed.       Lab Results   Component Value Date    Tacrolimus, Trough 2.1 (L) 01/28/2023    Total IgG 647 (L) 01/28/2023       Microbiology:  Microbiology Results (last day)       Procedure Component Value Date/Time Date/Time    Blood Culture [[id number]]  (Normal) Collected: 01/27/23 1749    Lab Status: Preliminary result Specimen: Blood from 1 Peripheral Draw Updated: 01/28/23 1815     Blood Culture, Routine No Growth at 24 hours    Blood Culture [[id number]]  (Normal) Collected: 01/27/23 1741    Lab Status: Preliminary result Specimen: Blood from 1 Peripheral Draw Updated: 01/28/23 1815     Blood Culture, Routine No Growth at 24 hours    Urine Culture [[id number]] Collected: 01/27/23 2033    Lab Status: In process Specimen: Urine from Clean Catch Updated: 01/27/23 2107            Imaging:  ECG 12 Lead    Result Date: 01/28/2023  SINUS TACHYCARDIA NONSPECIFIC ST DEPRESSION AND T WAVE ABNORMALITY , CONSIDER ISCHEMIA SINCE LAST TRACING , RATE HAS INCREASED AND ST DEPRESSION IS MORE EVIDENT SUGGEST CLINICAL CORRELATION Confirmed by Schuyler Amor ([id number]) on 01/28/2023 9:25:56 AM    XR Chest 1 view Portable    Result Date: 01/28/2023  EXAM: XR CHEST PORTABLE ACCESSION: [id number] UN     CLINICAL INDICATION: post intubation ; OTHER      TECHNIQUE: Single View AP Chest Radiograph.     COMPARISON: Same day at 8:46 p.m.     FINDINGS:     Unchanged support devices. Enteric tube sideport projects over the distal esophagus and tip projects over the gastroesophageal junction.     Lungs remain low in volume with unchanged mild hazy interstitial opacities bilaterally. Mild bibasilar atelectasis. Query small left pleural effusion. No pneumothorax.     Cardiac silhouette is unchanged in size. Vascular stent overlies the upper mediastinum. Left subclavian stent.             *  Unchanged position of the esophagogastric tube with tip projecting over the GE junction and sideport projecting over the distal esophagus. Recommend advancement by 10 cm. Otherwise stable chest. *  Interstitial pulmonary edema.         ==================== MODIFIED REPORT: (01/28/2023 8:05 AM) This report has been modified from its preliminary version; you may check the prior versions of radiology report, results history link for prior report versions (if they were previously visible in Epic).     -----------------------------------------------    XR Chest Portable    Result Date: 01/28/2023  EXAM: XR CHEST PORTABLE ACCESSION: [id number] UN     CLINICAL INDICATION: CATHETER VASCULAR FIT&ADJ      TECHNIQUE: Single View AP Chest Radiograph.     COMPARISON: 1 day prior     FINDINGS:     Interval placement of a left internal jugular approach central venous catheter with tip projecting over the superior cavoatrial junction. Interval advancement of the enteric tube coursing to the stomach with tip and sideport both now excluded from field-of-view.  Unchanged mild bilateral hazy interstitial opacities. Unchanged mild bibasilar atelectasis. No pleural effusion or pneumothorax.     Cardiac silhouette is unchanged in size. Vascular stent overlies the upper mediastinum. Left subclavian stent.             Left internal jugular approach central venous catheter tip projects over the cavoatrial junction.     Interval advancement of the enteric tube with tip and sideport now projecting beyond the field-of-view.     Otherwise stable chest.    CT Abdomen Pelvis Wo Contrast    Addendum Date: 01/28/2023    ==================== ADDENDUM: (01/28/2023 7:47 AM) On review, the following additional findings were noted:     Esophagogastric tube noted with tip terminating at, or just distal to, the GE junction. If positioning within the stomach is desired, slight advancement is recommended for optimal placement.     -----------------------------------------------         Result Date: 01/28/2023  EXAM: CT ABDOMEN PELVIS WO CONTRAST ACCESSION: [id number] UN CLINICAL INDICATION: 72 years old with infection unknown source      COMPARISON: 11/05/2022 CT abdomen pelvis     TECHNIQUE: A spiral CT scan was obtained without IV contrast from the lung bases to the pubic symphysis.  Images were reconstructed in the axial plane. Coronal and sagittal reformatted images were also provided for further evaluation.     Evaluation of the solid organs and vasculature is limited in the absence of intravenous contrast.         FINDINGS:     LOWER CHEST: Bibasilar consolidation. Cardiomegaly. Dense coronary artery calcifications.     LIVER: Normal liver contour. Subcentimeter hypodense lesions which are too small to characterize on CT but appear stable from prior.     BILIARY: Somewhat hydropic appearance of the gallbladder without CT evidence of gallstones. No associated wall thickening or surrounding fat stranding/free fluid is identified to suggest acute cholecystitis.     SPLEEN: Normal in size and contour. Similar appearance of a 1.6 cm hypoattenuating lesion in the posterior spleen, likely reflecting a cyst.     PANCREAS: Normal pancreatic contour.  No focal lesions.  No ductal dilation.     ADRENAL GLANDS: Normal appearance of the adrenal glands.     KIDNEYS/URETERS: Symmetric renal enhancement.  No hydronephrosis.  No solid renal mass. Atrophic bilateral native kidneys. Similar appearance of nonobstructive punctate calculi and vascular calcifications.     Left lower quadrant renal transplant. Subcentimeter hypoattenuating lesions within the renal transplant, too small to characterize. No hydronephrosis. Similar inflammatory stranding surrounding the transplant kidney, particularly around the hilum. Similar size of the 7.4 x 1.0 x 5.4 cm crescentic fluid collection within the soft tissues lateral to the transplant kidney, previously 8.3 x 0.9 x 5.5 cm (2:84, 4:30). There is increased inflammatory changes and thickening involving the adjacent oblique and transversus abdominis musculature, similar to prior. Thickening of the adjacent peritoneum also appears similar to prior.     BLADDER: Decompressed with a Foley catheter terminating within the bladder dome. Intraluminal gas likely secondary to recent instrumentation.     REPRODUCTIVE ORGANS: Hysterectomy.     GI TRACT: No findings of bowel obstruction or acute inflammation. Interval resolution of the previously noted multiple loops of prominent small bowel throughout the abdomen. Partially imaged enteric tube terminates at the gastroesophageal junction. Normal appendix. Colonic diverticulosis.     PERITONEUM, RETROPERITONEUM AND MESENTERY: No free air. Small volume ascites, slightly increased from prior. No new fluid collection or abscess identified. Haziness  of the central mesentery, likely reflecting edema.     LYMPH NODES: No lymphadenopathy by size criteria.     VESSELS: Normal caliber aorta. Otherwise limited examination without IV contrast. Significant burden of atherosclerotic calcifications of the abdominal aorta and its branch vessels.     BONES and SOFT TISSUES: Multilevel degenerative changes of the spine. Postsurgical changes of the ventral abdominal wall. Small fat-containing ventral hernia. Sequela of subcutaneous injections within the anterior abdominal wall.         1.  No new fluid collection or abscess identified. 2.  Similar-appearing inflammatory stranding surrounding the left lower quadrant transplant kidney with stable to mildly decreased size of the crescentic fluid collection within the overlying soft tissues measuring up to 7.4 cm in size. There is similar-appearing thickening and inflammatory stranding of the adjacent abdominal muscular/peritoneum. Findings are nonspecific and could potentially reflect an evolving hematoma in the postsurgical setting. Please note that CT cannot accurately assess the sterility of any fluid collection. As such, any potential abscess or superimposed infection is not entirely excluded 3.  Other chronic or incidental findings, as described within the body of the report.         US Renal Transplant W Doppler    Result Date: 01/28/2023  EXAM: US RENAL Glenna Durand ACCESSION: [id number] UN     CLINICAL INDICATION: 72 years old with Fever in patient with DDKT     COMPARISON: 12/25/22, 10/08/2022     TECHNIQUE:  Ultrasound views of the renal transplant were obtained using gray scale and color and spectral Doppler imaging. Views of the urinary bladder were obtained using gray scale and limited color Doppler imaging.     FINDINGS:     TRANSPLANTED KIDNEY: The renal transplant was located in the left lower quadrant. Normal size and echogenicity. 2 subcentimeter anechoic cysts are present. There is pelvic caliectasis. A 8.0 x 1.5 x 1.7 cm heterogeneous collection is seen lateral to the transplant kidney, which may represent residua of previously seen perinephric collection on study of 10/08/2022.     VESSELS: - Perfusion: Using power Doppler, normal perfusion was seen throughout the renal parenchyma. - Resistive indices in the renal transplant are decreased compared with prior examination. - Main renal artery/iliac artery: Patent with decreased peak systolic velocity when compared to prior - Main renal vein/iliac vein: Patent     BLADDER: Unremarkable.         Please see below for data measurements:     Transplant location: LLQ     Renal Transplant: Sagittal  12.4   cm; AP   6.1  cm; Transverse  6.2   cm     Segmental artery superior resistive index: 0.67, previously 0.85 Segmental artery mid resistive index: 0.79, previously 0.86 Segmental artery inferior resistive index: 0.75, previously 0.85 Previous resistive indices range of segmental arteries:   0.85-0.86     Main renal artery peak systolic velocity at anastomosis: 2.33 m/s, previously 3.58 m/s Main renal artery hilum resistive index: 0.75, previously 0.9 Main renal artery mid resistive index: 0.77, previously 0.93 Main renal artery anastomosis resistive index: 0.90, previously 0.94 Previous resistive indices range of main renal artery:  0.90-0.94      Main renal vein: patent     Iliac artery: Patent Iliac vein: Patent             --Decreased peak systolic velocity of the main renal artery at the anastomosis with overall decreased resistive indices when compared to 12/25/2022. --Thin, heterogeneous collection lateral  to the transplant kidney, may reflect residua of the perinephric collection noted on prior studies.                         XR Chest 1 view Portable    Result Date: 01/27/2023  EXAM: XR CHEST PORTABLE DATE: 01/27/2023 ACCESSION: [id number] UN DICTATED: 01/27/2023 8:58 PM INTERPRETATION LOCATION: MAIN CAMPUS     CLINICAL INDICATION: 72 years old Female with post intubation ; OTHER      TECHNIQUE: Single frontal view of the chest.     COMPARISON: 12/25/2022     FINDINGS:     Lung: Endotracheal tube with tip projecting approximately 3 cm above the carina. Mild hazy interstitial opacities bilaterally. Bibasilar hazy airspace opacities.     Pleura: No pleural effusion or pneumothorax identified.     Mediastinum: The cardiac silhouette is enlarged.     Bones: No evidence of acute osseous abnormality is identified. Esophagogastric tube with tip projecting over the left upper abdomen and sideport projecting over the distal esophagus, recommend advancement. Left vascular stent. Left subclavian stent noted.         Bibasilar hazy airspace opacities, which is nonspecific an likely presents atelectasis; however, aspiration or infection cannot be excluded.     Prominence of the vasculature and interstitium which could be related to low lung volumes or mild pulmonary edema.     Endotracheal tube with tip projecting approximately 3 cm of the carina.     Esophagogastric tube with tip projecting over the left upper abdomen and sideport projecting over the distal esophagus, recommend advancement by approximately 5 to 8 cm.         CT head WO contrast    Result Date: 01/27/2023  EXAM: Computed tomography, head or brain without contrast material. ACCESSION: [id number] UN         CLINICAL INDICATION: 72 years old Female with AMS, sezizure like activity, vomiting      COMPARISON: 11/05/2022     TECHNIQUE: Axial CT images of the head  from skull base to vertex without contrast.     FINDINGS: There is no midline shift. No mass lesion. There is no evidence of acute infarct. No acute intracranial hemorrhage. Scattered and confluent hypodensities within the periventricular and deep white matter.  These are nonspecific but commonly associated with small vessel ischemic changes. No fractures are evident. Mild mucous thickening of the right maxillary sinus. The other sinuses are clear.         No acute intracranial findings.          Serologies:  Lab Results   Component Value Date    CMV IGG Positive (A) 08/06/2022    EBV VCA IgG Antibody Positive (A) 08/06/2022    HIV Antigen/Antibody Combo Nonreactive 08/06/2022    Hep A IgG Reactive (A) 06/06/2022    Hep B Surface Ag Nonreactive 12/28/2022    Hep B S Ab Reactive (A) 08/06/2022    Hep B Surf Ab Quant 265.82 (H) 08/06/2022    Hep B Core Total Ab Reactive (A) 08/06/2022    Hepatitis C Ab Nonreactive 08/06/2022    RPR Nonreactive 08/06/2022    HSV 1 IgG Positive (A) 08/06/2022    HSV 2 IgG Negative 08/06/2022    Varicella IgG Positive 08/06/2022    Rubella IgG Scr Positive 06/06/2022    Toxoplasma Gondii IgG Negative 06/06/2022    Quantiferon TB Gold Plus Interpretation Negative 06/06/2022    Quantiferon Mitogen Minus Nil 9.92  06/06/2022    Quantiferon Antigen 1 minus Nil 0.17 06/06/2022       Immunizations:  Immunization History   Administered Date(s) Administered    COVID-19 VAC,BIVALENT(58YR UP),PFIZER 09/04/2019, 09/25/2019    COVID-19 VAC,MRNA,TRIS(12Y UP)(PFIZER)(GRAY CAP) 09/04/2019, 09/25/2019, 04/29/2020, 11/27/2020    COVID-19 VACC,MRNA,(PFIZER)(PF) 09/04/2019, 09/25/2019, 04/29/2020    Covid-19 Vacc, Unspecified 09/04/2019, 09/04/2019, 09/25/2019, 09/25/2019    HEPATITIS B VACCINE ADULT,IM(ENERGIX B, RECOMBIVAX) 04/27/2017    Hepatitis B Vaccine, Unspecified Formulation 04/27/2017    INFLUENZA INJ MDCK PF, QUAD,(FLUCELVAX)(70MO AND UP EGG FREE) 06/29/2017    INFLUENZA QUAD HIGH DOSE 52YRS+(FLUZONE) 04/05/2016, 04/27/2017, 05/31/2018, 04/10/2019    Influenza Virus Vaccine, unspecified formulation 04/24/2015, 05/26/2015, 04/05/2016, 04/05/2016, 04/27/2017, 04/27/2017, 05/31/2018, 05/31/2018, 04/10/2019, 04/10/2019, 05/09/2019, 04/29/2020, 05/09/2021, 05/09/2021, 04/24/2022, 04/26/2022    PNEUMOCOCCAL POLYSACCHARIDE 23-VALENT 07/09/2015    PPD Test 12/19/2015, 10/24/2016, 11/26/2017, 03/08/2019, 01/26/2020, 12/22/2020, 01/04/2022    Pneumococcal Conjugate 13-Valent 07/31/2018, 08/02/2018    Pneumococcal vaccine, Unspecified Formulation 06/28/2016, 08/26/2021    Pneumococcal, Unspecified Formulation 08/26/2021    SHINGRIX-ZOSTER VACCINE (HZV),RECOMBINANT,ADJUVANTED(IM) 01/18/2023    TdaP 09/25/2016

## 2023-01-30 LAB — BASIC METABOLIC PANEL
ANION GAP: 6 mmol/L (ref 5–14)
ANION GAP: 7 mmol/L (ref 5–14)
BLOOD UREA NITROGEN: 29 mg/dL — ABNORMAL HIGH (ref 9–23)
BLOOD UREA NITROGEN: 28 mg/dL — ABNORMAL HIGH (ref 9–23)
BUN / CREAT RATIO: 21
BUN / CREAT RATIO: 22
CALCIUM: 8.5 mg/dL — ABNORMAL LOW (ref 8.7–10.4)
CALCIUM: 8.1 mg/dL — ABNORMAL LOW (ref 8.7–10.4)
CHLORIDE: 112 mmol/L — ABNORMAL HIGH (ref 98–107)
CHLORIDE: 108 mmol/L — ABNORMAL HIGH (ref 98–107)
CO2: 20 mmol/L (ref 20.0–31.0)
CO2: 21 mmol/L (ref 20.0–31.0)
CREATININE: 1.4 mg/dL — ABNORMAL HIGH
CREATININE: 1.3 mg/dL — ABNORMAL HIGH
EGFR CKD-EPI (2021) FEMALE: 40 mL/min/{1.73_m2} — ABNORMAL LOW (ref >=60)
EGFR CKD-EPI (2021) FEMALE: 44 mL/min/{1.73_m2} — ABNORMAL LOW (ref >=60)
GLUCOSE RANDOM: 124 mg/dL (ref 70–179)
GLUCOSE RANDOM: 302 mg/dL — ABNORMAL HIGH (ref 70–179)
POTASSIUM: 4.2 mmol/L (ref 3.4–4.8)
POTASSIUM: 4.9 mmol/L — ABNORMAL HIGH (ref 3.4–4.8)
SODIUM: 138 mmol/L (ref 135–145)
SODIUM: 136 mmol/L (ref 135–145)

## 2023-01-30 LAB — MAGNESIUM
MAGNESIUM: 2.1 mg/dL (ref 1.6–2.6)
MAGNESIUM: 1.8 mg/dL (ref 1.6–2.6)

## 2023-01-30 LAB — CBC
HEMATOCRIT: 22.7 % — ABNORMAL LOW (ref 34.0–44.0)
HEMOGLOBIN: 7.8 g/dL — ABNORMAL LOW (ref 11.3–14.9)
MEAN CORPUSCULAR HEMOGLOBIN CONC: 34.4 g/dL (ref 32.0–36.0)
MEAN CORPUSCULAR HEMOGLOBIN: 32.6 pg — ABNORMAL HIGH (ref 25.9–32.4)
MEAN CORPUSCULAR VOLUME: 94.9 fL (ref 77.6–95.7)
MEAN PLATELET VOLUME: 9.3 fL (ref 6.8–10.7)
PLATELET COUNT: 60 10*9/L — ABNORMAL LOW (ref 150–450)
RED BLOOD CELL COUNT: 2.39 10*12/L — ABNORMAL LOW (ref 3.95–5.13)
RED CELL DISTRIBUTION WIDTH: 13.3 % (ref 12.2–15.2)
WBC ADJUSTED: 6 10*9/L (ref 3.6–11.2)

## 2023-01-30 LAB — CMV DNA, QUANTITATIVE, PCR: CMV VIRAL LD: NOT DETECTED

## 2023-01-30 LAB — TACROLIMUS LEVEL, TROUGH: TACROLIMUS, TROUGH: 5.2 ng/mL (ref 5.0–15.0)

## 2023-01-30 LAB — EBV QUANTITATIVE PCR, BLOOD: EBV VIRAL LOAD RESULT: NOT DETECTED

## 2023-01-30 MED ADMIN — meropenem (MERREM) 500 mg in sodium chloride 0.9 % (NS) 100 mL IVPB-MBP: 500 mg | INTRAVENOUS | @ 17:00:00 | Stop: 2023-01-30

## 2023-01-30 MED ADMIN — insulin lispro (HumaLOG) injection 0-20 Units: 0-20 [IU] | SUBCUTANEOUS | @ 20:00:00

## 2023-01-30 MED ADMIN — cholecalciferol (vitamin D3 25 mcg (1,000 units)) tablet 50 mcg: 50 ug | ORAL | @ 17:00:00

## 2023-01-30 MED ADMIN — mycophenolate (CELLCEPT) capsule 250 mg: 250 mg | ORAL | @ 12:00:00

## 2023-01-30 MED ADMIN — tacrolimus (PROGRAF) 1.5mg combo product: 1.5 mg | ORAL | @ 12:00:00

## 2023-01-30 MED ADMIN — hydrALAZINE (APRESOLINE) tablet 50 mg: 50 mg | ORAL | @ 03:00:00

## 2023-01-30 MED ADMIN — heparin (porcine) 5,000 unit/mL injection 5,000 Units: 5000 [IU] | SUBCUTANEOUS | @ 17:00:00

## 2023-01-30 MED ADMIN — mycophenolate (CELLCEPT) capsule 250 mg: 250 mg | ORAL

## 2023-01-30 MED ADMIN — levothyroxine (SYNTHROID) tablet 112 mcg: 112 ug | ORAL | @ 10:00:00

## 2023-01-30 MED ADMIN — magnesium sulfate 2gm/50mL IVPB: 2 g | INTRAVENOUS | Stop: 2023-01-29

## 2023-01-30 MED ADMIN — carvedilol (COREG) tablet 25 mg: 25 mg | ORAL | @ 08:00:00

## 2023-01-30 MED ADMIN — amlodipine (NORVASC) tablet 10 mg: 10 mg | ORAL | @ 16:00:00

## 2023-01-30 MED ADMIN — nystatin (MYCOSTATIN) oral suspension: 500000 [IU] | ORAL | @ 03:00:00 | Stop: 2023-02-04

## 2023-01-30 MED ADMIN — hydrALAZINE (APRESOLINE) tablet 50 mg: 50 mg | ORAL | @ 13:00:00

## 2023-01-30 MED ADMIN — nystatin (MYCOSTATIN) oral suspension: 500000 [IU] | ORAL | @ 20:00:00 | Stop: 2023-02-04

## 2023-01-30 MED ADMIN — meropenem (MERREM) 500 mg in sodium chloride 0.9 % (NS) 100 mL IVPB-MBP: 500 mg | INTRAVENOUS | @ 10:00:00 | Stop: 2023-01-30

## 2023-01-30 MED ADMIN — predniSONE (DELTASONE) tablet 5 mg: 5 mg | ORAL | @ 13:00:00

## 2023-01-30 MED ADMIN — meropenem (MERREM) 500 mg in sodium chloride 0.9 % (NS) 100 mL IVPB-MBP: 500 mg | INTRAVENOUS | @ 03:00:00 | Stop: 2023-02-05

## 2023-01-30 MED ADMIN — atorvastatin (LIPITOR) tablet 10 mg: 10 mg | ORAL | @ 13:00:00

## 2023-01-30 MED ADMIN — chlorthalidone (HYGROTON) tablet 25 mg: 25 mg | ORAL | @ 16:00:00

## 2023-01-30 MED ADMIN — tacrolimus (PROGRAF) 1.5mg combo product: 1.5 mg | ORAL

## 2023-01-30 MED ADMIN — nystatin (MYCOSTATIN) oral suspension: 500000 [IU] | ORAL | @ 10:00:00 | Stop: 2023-02-04

## 2023-01-30 NOTE — Unmapped (Signed)
Transplant Nephrology Follow-Up Consult note      Assessment/Recommendations: Julie [name]  is a 72 y.o. 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     # Status post kidney transplant, Allograft Function (unstable):   DDKT 08/06/2022 2/2 to DM  Follows with Dr. Elvera Maria   Baseline cr 0.9  Peaked at 1.97, down trending, improving  Adequate UO  CPRA/DSA pending    #Fever/Septic shock  In the setting of recurrent UTI  ESBL ecoli bacteremia on prior admission  BC/UC negative on this admission, however elevated WBC on UA, bac few  EBV/CMV negative  Originally treated with vanc cefepime, now transition to meropenem due to ongoing febrile illness?  ID following  Foley removed 7/7  Recc PVR, recc outpt urodynamic studies    #Perinephric fluid collection  CT abd pelvis w/o con . Similar size of the 7.4 x 1.0 x 5.4 cm crescentic fluid collection within the soft tissues lateral to the transplant kidney, previously 8.3 x 0.9 x 5.5 cm (2:84, 4:30). There is increased inflammatory changes and thickening involving the adjacent oblique and transversus abdominis musculature, similar to prior. Thickening of the adjacent peritoneum also appears similar to prior.   drained 10/10/2022 with no growth on culture  Does not appear to be infectious  Unknown etiology, unchanged from prior  Abx as per above    # Immunosuppression [High risk medical decision making for drug therapy requiring intensive monitoring for toxicity]  On home pred 5 mg daily, MMF 500 BID, tac1.5mg  BID trough goal 5-7  MMF decreased to 250 mg BID during hospitalization  Increase mycophenolate to 500 mg BID  Cont home pred and tacrolimus as per above    # Infectious Prophylaxis and Monitoring:   -CMV D+/R+, EBV D+/R+ - completed prophylaxis for all except PJP - can switch to atovaquone if tolerated for last week (end 02/04/2023)  - f/u PCR of both  - currently being treated for recurrent UTI (despite suppressive therapy) though fluid collection peri-transplant appears to be stable  - moderate risk for Hep B reactivation - LFTs, HBV DNA, HBsAg q 3 months    # Blood Pressure / Volume:  home regimen of coreg 25bid, chlorthalidone 25mg , hydralazine 50mg  bid, spironolactone 25mg  ,amlodipine 5mg  bid  Restarted on hydralazine 50 BID, chlorthalidone 25 mg daily, carvedilol 25 mg BID and amlodpine 10 mg daily  Awaiting vitals post AM meds    #Elevated pulse pressure  Significant low DBP 30-40s, PP 150s  CT abd pelvis w/o con 7/6 with significant burden of atherosclerotic cal of abd aorta and branches  likely in the setting of ESRD causing stiff vasculature.     #Normocytic Anemia  At baseline 8-9   Iron panel 12/29/2022 normal iron, iron sat, low tibc  Recc EPO 10,000 subcutaneous once      **Transplant patients with an open wound require wound care with sterile water only. The patient should be counseled on this at the time of discharge if they have not already been doing this.**     Patient was seen and discussed with attending nephrologist Dr. Vickie Epley. Recommendations were communicated to the primary team. Nephrology will continue to follow.    Thank you for this consult.   Please page the renal fellow on call with questions/concerns.    [name]  [name], MD  Division of Nephrology and Hypertension  [facility]  01/30/2023    ___________________________________________________________    Subjective/Interval History: Patient is no complaints.  Good p.o. intake.  Has been downgraded but no bed.  Denies chest pain shortness of breath nausea vomiting diarrhea no blood or black in stool no blood in urine.    Medications:   Current Facility-Administered Medications   Medication Dose Route Frequency Provider Last Rate Last Admin    acetaminophen (TYLENOL) tablet 1,000 mg  1,000 mg Oral Q8H PRN Trina Ao, MD   1,000 mg at 01/29/23 0542    aluminum-magnesium hydroxide-simethicone (MAALOX MAX) 80-80-8 mg/mL oral suspension  30 mL Oral Q4H PRN Bernita Raisin, MD        amlodipine (NORVASC) tablet 10 mg  10 mg Oral Daily Estell Harpin, MD   10 mg at 01/30/23 1152    atorvastatin (LIPITOR) tablet 10 mg  10 mg Oral Daily Bernita Raisin, MD   10 mg at 01/30/23 0849    carvedilol (COREG) tablet 25 mg  25 mg Oral BID Trina Ao, MD   25 mg at 01/30/23 0405    chlorthalidone (HYGROTON) tablet 25 mg  25 mg Oral Daily Estell Harpin, MD   25 mg at 01/30/23 1152    cholecalciferol (vitamin D3 25 mcg (1,000 units)) tablet 50 mcg  50 mcg Oral Daily Anastasio Champion, MD   50 mcg at 01/30/23 1318    dextrose (D10W) 10% bolus 125 mL  12.5 g Intravenous Q10 Min PRN Bernita Raisin, MD        glucagon injection 1 mg  1 mg Intramuscular Once PRN Bernita Raisin, MD        glucose chewable tablet 16 g  16 g Oral Q10 Min PRN Bernita Raisin, MD        guaiFENesin (ROBITUSSIN) oral syrup  200 mg Oral Q4H PRN Bernita Raisin, MD        heparin (porcine) 5,000 unit/mL injection 5,000 Units  5,000 Units Subcutaneous [facility] Choi, [place], DO   5,000 Units at 01/30/23 1318    hydrALAZINE (APRESOLINE) tablet 50 mg  50 mg Oral BID Trina Ao, MD   50 mg at 01/30/23 0849    insulin lispro (HumaLOG) injection 0-20 Units  0-20 Units Subcutaneous Mickel Baas, MD   2 Units at 01/29/23 1623    levothyroxine (SYNTHROID) tablet 112 mcg  112 mcg Oral daily Trina Ao, MD   112 mcg at 01/30/23 0610    melatonin tablet 3 mg  3 mg Oral Nightly PRN Bernita Raisin, MD        meropenem (MERREM) 500 mg in sodium chloride 0.9 % (NS) 100 mL IVPB-MBP  500 mg Intravenous Q8H Meryl Dare, Kaitlyn L, MD 200 mL/hr at 01/30/23 1318 500 mg at 01/30/23 1318    mycophenolate (CELLCEPT) capsule 250 mg  250 mg Oral BID Trina Ao, MD   250 mg at 01/30/23 0745    nystatin (MYCOSTATIN) oral suspension  500,000 Units Oral Q8H Park Breed, MD   500,000 Units at 01/30/23 0610    ondansetron (ZOFRAN-ODT) disintegrating tablet 4 mg  4 mg Oral Q6H PRN Trina Ao, MD        predniSONE (DELTASONE) tablet 5 mg  5 mg Oral Daily Myriam Jacobson Jae, DO   5 mg at 01/30/23 1610    senna (SENOKOT) tablet 2 tablet  2 tablet Oral Nightly PRN Bernita Raisin, MD  sulfamethoxazole-trimethoprim (BACTRIM) 400-80 mg tablet 80 mg of trimethoprim  1 tablet Oral Q MWF Jaynie Collins, MD   80 mg of trimethoprim at 01/29/23 1200    tacrolimus (PROGRAF) 1.5mg  combo product  1.5 mg Oral BID Jaynie Collins, MD   1.5 mg at 01/30/23 0745        Physical Exam:  Vitals:    01/30/23 1200   BP: 177/33   Pulse: 69   Resp: 26   Temp: 37 ??C (98.6 ??F)   SpO2: 100%     I/O this shift:  In: 53 [P.O.:75]  Out: -     Intake/Output Summary (Last 24 hours) at 01/30/2023 1403  Last data filed at 01/30/2023 1200  Gross per 24 hour   Intake 1175 ml   Output 1300 ml   Net -125 ml     General: well-appearing, NAD  HEENT: conjunctival anicteric  CV: RRR nl s1/s2 no s3/s4 3/6 systolic murmur, no JVD, no LE edema  Lungs: nl WOB, CTAB w/o adventitious sounds  Abd: soft, non-tender, non-distended w/o r/g  Skin: well healed L sided transplant scar  Psych: alert, engaged, appropriate mood and affect  Neuro: normal speech, no gross focal deficits     LABS/IMAGING: Lab and imaging results from last 24 hrs noted and reviewed in EMR.

## 2023-01-30 NOTE — Unmapped (Addendum)
error 

## 2023-01-30 NOTE — Unmapped (Signed)
Transplant Nephrology Follow-Up Consult note      Assessment/Recommendations: Julie [name]  is a 72 y.o. 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     # Status post kidney transplant, Allograft Function (unstable):   DDKT 08/06/2022 2/2 to DM  Follows with Dr. Elvera Maria   Baseline cr 0.9  Peaked at 1.97, now down trending 1.53, likely 2/2 to bacteremia  -1.9L urine output  CPRA/DSA ordered    #Fever/Recurrent UTI  ESBL ecoli bacteremia  Originally treated with vanc cefepime, now transition to meropenem due to ongoing febrile illness  ID following  EEBV/ CMV pending    #Perinephric fluid collection  Ir drained 10/10/2022 with no growth  Does not appear to be infectious  Unknown etiology    # Immunosuppression [High risk medical decision making for drug therapy requiring intensive monitoring for toxicity]  On home pred 5 mg daily, MMF 500 BID, tac1/05 trough goal 5-7  MMF decreased to 250 mg BID during hospitalization, increase to 500 mg BID on dc  Cont home pred and tacrolimus    # Infectious Prophylaxis and Monitoring:   -CMV D+/R+, EBV D+/R+ - completed prophylaxis for all except PJP - can switch to atovaquone if tolerated for last week (end 02/04/2023)  - f/u PCR of both  - currently being treated for recurrent UTI (despite suppressive therapy) though fluid collection peri-transplant appears to be stable  - moderate risk for Hep B reactivation - LFTs, HBV DNA, HBsAg q 3 months    #Normocytic Anemia  At baseline 8-9   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 The patient should be counseled on this at the time of discharge if they have not already been doing this.**     Patient was seen and discussed with attending nephrologist Dr. Vickie Epley. Recommendations were communicated to the primary team. Nephrology will continue to follow.    Thank you for this consult.   Please page the renal fellow on call with questions/concerns.    Ambrose Mantle, MD  Division of Nephrology and Hypertension  [facility]  01/29/2023    ___________________________________________________________    Subjective/Interval History: Pt feels well  Happy to be extubated  No complaints  Denies CP, SOB, N, V, diarrhea abd pain    Medications:   Current Facility-Administered Medications   Medication Dose Route Frequency Provider Last Rate Last Admin    acetaminophen (TYLENOL) tablet 1,000 mg  1,000 mg Oral Q8H PRN Trina Ao, MD   1,000 mg at 01/29/23 0542    aluminum-magnesium hydroxide-simethicone (MAALOX MAX) 80-80-8 mg/mL oral suspension  30 mL Oral Q4H PRN Bernita Raisin, MD        atorvastatin (LIPITOR) tablet 10 mg  10 mg Oral Daily Bernita Raisin, MD   10 mg at 01/29/23 0839    dextrose (D10W) 10% bolus 125 mL  12.5 g Intravenous Q10 Min PRN Bernita Raisin, MD        glucagon injection 1 mg  1 mg Intramuscular Once PRN Bernita Raisin, MD  glucose chewable tablet 16 g  16 g Oral Q10 Min PRN Bernita Raisin, MD        guaiFENesin (ROBITUSSIN) oral syrup  200 mg Oral Q4H PRN Bernita Raisin, MD        insulin lispro (HumaLOG) injection 0-20 Units  0-20 Units Subcutaneous ACHS Bernita Raisin, MD   2 Units at 01/29/23 1623    levothyroxine (SYNTHROID) tablet 112 mcg  112 mcg Oral daily Trina Ao, MD   112 mcg at 01/29/23 0542    melatonin tablet 3 mg  3 mg Oral Nightly PRN Bernita Raisin, MD        meropenem (MERREM) 500 mg in sodium chloride 0.9 % (NS) 100 mL IVPB-MBP  500 mg Intravenous Q8H Jaynie Collins, MD Stopped at 01/29/23 1511    mycophenolate (CELLCEPT) capsule 250 mg  250 mg Oral BID Trina Ao, MD   250 mg at 01/29/23 9629    nystatin (MYCOSTATIN) oral suspension  500,000 Units Oral Q8H Park Breed, MD   500,000 Units at 01/29/23 1507    ondansetron (ZOFRAN-ODT) disintegrating tablet 4 mg  4 mg Oral Q6H PRN Trina Ao, MD        predniSONE (DELTASONE) tablet 5 mg  5 mg Oral Daily Myriam Jacobson Jae, DO   5 mg at 01/29/23 5284    senna (SENOKOT) tablet 2 tablet  2 tablet Oral Nightly PRN Bernita Raisin, MD        sulfamethoxazole-trimethoprim (BACTRIM) 400-80 mg tablet 80 mg of trimethoprim  1 tablet Oral Q MWF Jaynie Collins, MD   80 mg of trimethoprim at 01/29/23 1200    tacrolimus (PROGRAF) capsule 0.5 mg  0.5 mg Oral Nightly Myriam Jacobson Jae, DO   0.5 mg at 01/28/23 2028    tacrolimus (PROGRAF) capsule 1 mg  1 mg Oral Daily Myriam Jacobson Jae, DO   1 mg at 01/29/23 1324        Physical Exam:  Vitals:    01/29/23 1700   BP: 176/30   Pulse: 72   Resp: 20   Temp:    SpO2: 98%     I/O this shift:  In: 30 [I.V.:30]  Out: 250 [Urine:250]    Intake/Output Summary (Last 24 hours) at 01/29/2023 1757  Last data filed at 01/29/2023 1600  Gross per 24 hour   Intake 370 ml   Output 1200 ml   Net -830 ml     General: well-appearing, NAD  HEENT: conjunctival anicteric  CV: RRR nl s1/s2 no s3/s4 3/6 systolic murmur, no JVD, no LE edema  Lungs: nl WOB, CTAB w/o adventitious sounds  Abd: soft, non-tender, non-distended w/o r/g  Skin: well heal;ed L sided transplant scar  Psych: alert, engaged, appropriate mood and affect  Neuro: normal speech, no gross focal deficits     LABS/IMAGING: Lab and imaging results from last 24 hrs noted and reviewed in EMR.

## 2023-01-30 NOTE — Unmapped (Signed)
Patient is alert & oriented x4, with no complaints of pain. She remained afebrile and in NSR. She is on room air. Please see flowsheet and MAR for vitals and trends.    Problem: Adult Inpatient Plan of Care  Goal: Plan of Care Review  Outcome: Progressing  Flowsheets (Taken 01/30/2023 1630)  Progress: improving  Plan of Care Reviewed With:   patient   spouse  Goal: Patient-Specific Goal (Individualized)  Outcome: Progressing  Flowsheets (Taken 01/30/2023 1630)  Patient/Family-Specific Goals (Include Timeframe): Patient will maintain O2 saturation >92% throughout shift ending 01/30/23.  Individualized Care Needs: antibiotics  Goal: Absence of Hospital-Acquired Illness or Injury  Intervention: Identify and Manage Fall Risk  Recent Flowsheet Documentation  Taken 01/30/2023 0800 by Threasa Beards, Joevon Holliman G, RN  Safety Interventions:   aspiration precautions   commode/urinal/bedpan at bedside   fall reduction program maintained   family at bedside   lighting adjusted for tasks/safety   low bed  Intervention: Prevent Infection  Recent Flowsheet Documentation  Taken 01/30/2023 0800 by Threasa Beards, Lylianna Fraiser G, RN  Infection Prevention:   cohorting utilized   rest/sleep promoted   single patient room provided

## 2023-01-30 NOTE — Unmapped (Signed)
OCCUPATIONAL THERAPY  Evaluation (01/30/23 1007)    Patient Name:  Julie [name]       Medical Record Number: [id number]   Date of Birth: [birth year]  Sex: Female      Post-Discharge Occupational Therapy Recommendations: 3x weekly (with confirmed 24-7 assist from family)          Equipment Recommendation  OT DME Recommendations: None       OT Treatment Diagnosis:      Decreased standing balance    Assessment  Problem List: Decreased safety awareness, Fall risk, Impaired ADLs, Impaired balance, Impaired fine motor skills, Decreased mobility  Personal Factors/Comorbidities (Occupational Profile and History Review): Expanded (Moderate)  Assessment of Occupational Performance : Cognitive skills, Balance, Dexterity, Fine or gross motor coordination, Mobility, Strength  Clinical Decision Making: Moderate Complexity    Assessment: Pt presents willing to participate and complete standing ADL at sink.  Pt able to reach B feet for seated LB dressing via figure 4. Pt completing OOB mobility with MIN HHA, several LOBs noted that pt at times self-corrected or needed CGA/MIN A. Pt stood to complete several grooming tasks at the sink without physical assist. Pt receptive to education on benefits of continued OOB and ADL participation. Pt continues to exhibit deficits in functional independence and will benefit from acute OT services to progress towards PLOF.       Today's Interventions: Pt educated on role of OT, POC, LB dressing, functional mobility in hallway, functional transfers, grooming standing at the sink, educ on benefits of OOB, educ on pacing strategies, positioning.       Activity Tolerance During Today's Session  Tolerated treatment well    Plan  Planned Frequency of Treatment: Plan of Care Initiated: 01/30/23  1-2x per day for: 3-4x week  Planned Treatment Duration: 02/13/23    Planned Interventions:  Cognitive Skills Development, Education (Patient/Family/Caregiver), Visual/Perceptual training, Self-Care/Home Training, Home Exercise Program, Therapeutic Exercise, Therapeutic Activity      GOALS:   Patient and Family Goals: To go home    Short Term:   SHORT GOAL #1: Pt will complete toilet transfer and toileting with MOD I.   Time Frame : 2 weeks  SHORT GOAL #2: Pt will complete > 5 minute standing ADL with MOD I.   Time Frame : 2 weeks  SHORT GOAL #3: Pt will complete functional mobility to gather ADL items with MOD I.   Time Frame : 2 weeks       Long Term Goal #1: Pt will score 24/24 on AMPAC  Time Frame: 4 weeks    Prognosis:  Good  Positive Indicators:  PLOF  Barriers to Discharge: Decreased safety awareness, Gait instability, Impaired Balance, Inability to safely perform ADLS    Subjective  Medical Updates Since Last Visit/Relevant PMH Affecting Clinical Decision Making:    Prior Functional Status Pt reports being independnet with ADL/IADL, taking care of her gardens/blueberries, and denies falls. Pt reports she does not use DME for mobility.    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)     since July 2017    Hypertension     Hypothyroidism (acquired)     Kidney  transplant status, cadaveric 08/08/2022    Type 2 diabetes mellitus (CMS-HCC)     Social History     Tobacco Use    Smoking status: Former     Current packs/day: 0.00     Average packs/day: 0.8 packs/day for 30.0 years (22.5 ttl pk-yrs)     Types: Cigarettes     Start date: 06/06/1976     Quit date: 06/06/2006     Years since quitting: 16.6    Smokeless tobacco: Former   Substance Use Topics    Alcohol use: Yes     Comment: only special occasions      Past Surgical History:   Procedure Laterality Date    CATARACT EXTRACTION      HYSTERECTOMY      OOPHORECTOMY      PERITONEAL CATHETER INSERTION PR LAP INSERTION TUNNELED INTRAPERITONEAL CATHETER N/A 09/27/2018    Procedure: LAPAROSCOPY, SURGICAL; WITH INSERTION OF INTRAPERITONEAL CANNULA OR CATHETER, PERMANENT;  Surgeon: Leona Carry, MD;  Location: MAIN OR [place];  Service: Transplant    PR LAP INSERTION TUNNELED INTRAPERITONEAL CATHETER N/A 04/08/2019    Procedure: LAPAROSCOPY, SURGICAL; WITH INSERTION OF INTRAPERITONEAL CANNULA OR CATHETER, PERMANENT;  Surgeon: Leona Carry, MD;  Location: MAIN OR [place];  Service: Transplant    PR LAP REVISE INTRAPERITONEAL CATHETER N/A 08/26/2019    Procedure: LAPAROSCOPY, SURGICAL; W/REVIS PREV PLACED INTRAPERITONEAL CANNULA/CATH, REMOV INTRALUMIN OBSTRUCT MATERIAL;  Surgeon: Leona Carry, MD;  Location: MAIN OR [facility];  Service: Transplant    PR REDUCE VOLVULUS,INTUSS,INTERN HERNIA N/A 10/13/2022    Procedure: REDUCTION OF VOLVULUS, INTUSSUSCEPTION, INTERNAL HERNIA, BY LAPAROTOMY;  Surgeon: Suella Broad, MD;  Location: MAIN OR [place];  Service: Trauma    PR REMOVAL TUNNELED INTRAPERITONEAL CATHETER N/A 05/06/2018    Procedure: REMOVAL OF PERMANENT INTRAPERITONEAL CANNULA OR CATHETER;  Surgeon: Leona Carry, MD;  Location: MAIN OR [facility];  Service: Transplant    PR REMOVE PERITONEAL FOREIGN BODY N/A 04/08/2019    Procedure: Removal Of Peritoneal Of Foreign Body From Peritoneal Cavity;  Surgeon: Leona Carry, MD;  Location: MAIN OR [facility];  Service: Transplant    PR TRANSPLANT,PREP RENAL GRAFT/ARTERIAL Right 08/06/2022    Procedure: [facility] RECONSTRUCTION CADAVER/LIVING DONOR RENAL ALLOGRAFT PRIOR TO TRANSPLANT; ARTERIAL ANASTOMOSIS EAC;  Surgeon: Toledo, Lilyan Punt, MD;  Location: MAIN OR [facility];  Service: Transplant    PR TRANSPLANTATION OF KIDNEY Right 08/06/2022    Procedure: RENAL ALLOTRANSPLANTATION, IMPLANTATION OF GRAFT; WITHOUT RECIPIENT NEPHRECTOMY;  Surgeon: Toledo, Lilyan Punt, MD;  Location: MAIN OR [facility];  Service: Transplant    TUBAL LIGATION Family History   Problem Relation Age of Onset    Diabetes Mother     Alzheimer's disease Father     Diabetes Sister     Diabetes Sister     Diabetes Sister     Anesthesia problems Neg Hx     Bleeding Disorder Neg Hx         Gabapentin and Valacyclovir     Objective Findings  Precautions / Restrictions  Falls precautions       Weight Bearing  Non-applicable    Required Braces or Orthoses  Non-applicable    Communication Preference  Verbal, Visual (Pt requires slow speech and repetition, at times having difficulty understanding questions requiring repetition)       Pain  Denies pain    Equipment / Environment  Vascular access (PIV, TLC, Port-a-cath, PICC), Telemetry             Cognition  Orientation Level:  Oriented x 4   Arousal/Alertness:  Appropriate responses to stimuli   Attention Span:  Attends with cues to redirect   Memory:  Appears intact   Following Commands:  Follows one step commands without difficulty   Safety Judgment:  Decreased awareness of need for safety   Awareness of Errors and Problem Solving:  Able to problem solve independently       Vision / Hearing   Vision: No acute deficits identified     Hearing: Hearing impairment   Hearing: HOH     Hand Function:  Right Hand Function: Right hand grip strength, ROM and coordination WNL  Left Hand Function: Left hand grip strength, ROM and coordination WNL  Hand Dominance: Right    Skin Inspection:  Skin Inspection: Intact where visualized       ROM / Strength:  UE ROM/Strength: Left WFL, Right WFL  LE ROM/Strength: Left WFL, Right WFL         Sensation:  Sensory/ Proprioception/ Stereognosis comments: Denies NT    Balance:  Animal nutritionist of Assistance: Supervision  Dynamic Sitting-Level of Assistance: Supervision    Standing Balance comments: MIN HHA    Functional Mobility  Transfers: Contact Guard assist (sit-stand=CGA)  Bed Mobility - Needs Assistance:  (NT)  Ambulation: Functional mobility in hallway=MIN HHA, several LOBs    ADLs  ADLs - Needs Assistance: Feeding, Grooming, Bathing, Toileting, UB dressing, LB dressing  Feeding - Needs Assistance: Performed seated, Set Up Assist  Grooming - Needs Assistance: Performed standing (SBA)  Bathing - Needs Assistance: Print production planner - Needs Assistance: Contact Guard assist  UB Dressing - Needs Assistance: Set Up Assist, Performed seated  LB Dressing - Needs Assistance: Contact Guard assist, Performed standing    Vitals / Orthostatics  Vitals/Orthostatics: VSS    Patient at end of session: All needs in reach, Nurse notified, Lines intact, In bed     Occupational Therapy Session Duration  OT Individual [mins]: 10  OT Co-Treatment [mins]: 17 (cotx with Huey Romans PT)  Reason for Co-treatment: Poor activity tolerance       AM-PAC  Difficulty turning over In bed?: None - Modified Independent/Independent  Difficulty sitting down/standing up from chair with arms? : A Little - Minimal/Contact Guard Assist/Supervision  Difficulty moving from supine to sitting on edge of bed?: A Little - Minimal/Contact Guard Assist/Supervision  Help moving to and from bed from wheelchair?: A Little - Minimal/Contact Guard Assist/Supervision  Help currently needed walking in a hospital room?: A Little - Minimal/Contact Guard Assist/Supervision  Help currently needed climbing 3-5 steps with railing?: A Little - Minimal/Contact Guard Assist/Supervision  Basic Mobility Score 5 click: 16  Basic Mobility Score 6 click: 19  Lower Body Dressing assistance needs: A Little - Minimal/Contact Guard Assist/Supervision  Bathing assistance needs: A Little - Minimal/Contact Guard Assist/Supervision  Toileting assistance needs: A Little - Minimal/Contact Guard Assist/Supervision  Upper Body Dressing assistance needs: A Little - Minimal/Contact Guard Assist/Supervision  Personal Grooming assistance needs: A Little - Minimal/Contact Guard Assist/Supervision  Eating Meals assistance needs: A Little - Minimal/Contact Guard Assist/Supervision  Daily Activity Score: 18    I attest that I have reviewed the above information.  Signed: Caleen Essex, OT  Filed 01/30/2023

## 2023-01-30 NOTE — Unmapped (Signed)
Tacrolimus Therapeutic Monitoring Pharmacy Note    Julie [name] is a 72 y.o. female continuing tacrolimus.     Indication: Kidney transplant     Date of Transplant:  08/06/22       Prior Dosing Information: Home regimen 1 mg in am and 0.5 mg in evening . Dose increased on 7/8pm to 1.5mg  bid    Source(s) of information used to determine prior to admission dosing: Home Medication List    Goals:  Therapeutic Drug Levels  Tacrolimus trough goal:  5-7 ng/ml    Additional Clinical Monitoring/Outcomes  Monitor renal function (SCr and urine output) and liver function (LFTs)  Monitor for signs/symptoms of adverse events (e.g., hyperglycemia, hyperkalemia, hypomagnesemia, hypertension, headache, tremor)    Results:   Tacrolimus level:  5.2 ng/mL, drawn appropriately    Pharmacokinetic Considerations and Significant Drug Interactions:  Concurrent hepatotoxic medications: None identified  Concurrent CYP3A4 substrates/inhibitors: None identified  Concurrent nephrotoxic medications: None identified    Assessment/Plan:  Recommendedation(s)  Can continue current dose with close monitoring, Some concern with large bump in concentration after one dose that  low concentrations on previous days was reflective of missing some doses so if concentration continues to rise would back down a bit to 1mg  bid    Follow-up  Daily tac levels have been ordered for now  .   A pharmacist will continue to monitor and recommend levels as appropriate    Please page service pharmacist with questions/clarifications.    Myer Haff Campbell-Bright, RPH

## 2023-01-30 NOTE — Unmapped (Signed)
Tacrolimus Therapeutic Monitoring Pharmacy Note    Julie [name] is a 72 y.o. female continuing tacrolimus.     Indication: Kidney transplant     Date of Transplant:  08/06/22       Prior Dosing Information: Home regimen 1 mg in am and 0.5 mg in evening      Source(s) of information used to determine prior to admission dosing: Fill HIstory or note    Goals:  Therapeutic Drug Levels  Tacrolimus trough goal:  5-7 ng/ml    Additional Clinical Monitoring/Outcomes  Monitor renal function (SCr and urine output) and liver function (LFTs)  Monitor for signs/symptoms of adverse events (e.g., hyperglycemia, hyperkalemia, hypomagnesemia, hypertension, headache, tremor)    Results:   Tacrolimus level:  1.9 ng/mL, drawn 01/29/23 @ 07:54    Pharmacokinetic Considerations and Significant Drug Interactions:  Concurrent hepatotoxic medications: None identified  Concurrent CYP3A4 substrates/inhibitors: None identified  Concurrent nephrotoxic medications: None identified    Assessment/Plan:  Recommendedation(s)  Increase to tacrolimus 1.5 mg BID    Follow-up  Daily tac levels have been ordered .   A pharmacist will continue to monitor and recommend levels as appropriate    Please page service pharmacist with questions/clarifications.    Deanna Artis, PharmD

## 2023-01-30 NOTE — Unmapped (Signed)
Problem: Adult Inpatient Plan of Care  Goal: Plan of Care Review  Outcome: Ongoing - Unchanged  Goal: Patient-Specific Goal (Individualized)  Outcome: Ongoing - Unchanged  Goal: Absence of Hospital-Acquired Illness or Injury  Outcome: Ongoing - Unchanged  Intervention: Identify and Manage Fall Risk  Recent Flowsheet Documentation  Taken 01/29/2023 0800 by Roselee Culver, RN  Safety Interventions:   aspiration precautions   environmental modification   fall reduction program maintained   lighting adjusted for tasks/safety   low bed   nonskid shoes/slippers when out of bed   room near unit station  Intervention: Prevent Skin Injury  Recent Flowsheet Documentation  Taken 01/29/2023 1800 by Roselee Culver, RN  Positioning for Skin: Right  Device Skin Pressure Protection: adhesive use limited  Skin Protection: adhesive use limited  Taken 01/29/2023 1600 by Roselee Culver, RN  Positioning for Skin: Left  Device Skin Pressure Protection: adhesive use limited  Skin Protection: adhesive use limited  Taken 01/29/2023 1200 by Roselee Culver, RN  Positioning for Skin: Right  Device Skin Pressure Protection: adhesive use limited  Skin Protection: adhesive use limited  Taken 01/29/2023 1000 by Roselee Culver, RN  Positioning for Skin: Left  Device Skin Pressure Protection: adhesive use limited  Skin Protection: adhesive use limited  Taken 01/29/2023 0800 by Roselee Culver, RN  Positioning for Skin: Right  Device Skin Pressure Protection: adhesive use limited  Skin Protection: adhesive use limited  Intervention: Prevent and Manage VTE (Venous Thromboembolism) Risk  Recent Flowsheet Documentation  Taken 01/29/2023 1800 by Roselee Culver, RN  Anti-Embolism Device Type: SCD, Knee  Anti-Embolism Intervention: Refused  Taken 01/29/2023 1600 by Roselee Culver, RN  VTE Prevention/Management: anticoagulant therapy  Anti-Embolism Device Type: SCD, Knee  Anti-Embolism Intervention: Refused  Taken 01/29/2023 1200 by Roselee Culver, RN  Anti-Embolism Device Type: SCD, Knee  Anti-Embolism Intervention: Refused  Taken 01/29/2023 1000 by Roselee Culver, RN  Anti-Embolism Device Type: SCD, Knee  Anti-Embolism Intervention: Refused  Taken 01/29/2023 0800 by Roselee Culver, RN  VTE Prevention/Management: anticoagulant therapy  Anti-Embolism Device Type: SCD, Knee  Anti-Embolism Intervention: Refused  Intervention: Prevent Infection  Recent Flowsheet Documentation  Taken 01/29/2023 0800 by Roselee Culver, RN  Infection Prevention:   cohorting utilized   hand hygiene promoted   rest/sleep promoted   single patient room provided  Goal: Optimal Comfort and Wellbeing  Outcome: Ongoing - Unchanged  Goal: Readiness for Transition of Care  Outcome: Ongoing - Unchanged  Goal: Rounds/Family Conference  Outcome: Ongoing - Unchanged     Problem: Fall Injury Risk  Goal: Absence of Fall and Fall-Related Injury  Outcome: Ongoing - Unchanged  Intervention: Identify and Manage Contributors  Recent Flowsheet Documentation  Taken 01/29/2023 0800 by Roselee Culver, RN  Self-Care Promotion: independence encouraged  Intervention: Promote Injury-Free Environment  Recent Flowsheet Documentation  Taken 01/29/2023 0800 by Roselee Culver, RN  Safety Interventions:   aspiration precautions   environmental modification   fall reduction program maintained   lighting adjusted for tasks/safety   low bed   nonskid shoes/slippers when out of bed   room near unit station     Problem: Self-Care Deficit  Goal: Improved Ability to Complete Activities of Daily Living  Outcome: Ongoing - Unchanged  Intervention: Promote Activity and Functional Independence  Recent Flowsheet Documentation  Taken 01/29/2023 0800 by Roselee Culver, RN  Self-Care Promotion: independence encouraged     Problem: Non-Violent Restraints  Goal: Patient  will remain free of restraint events  Outcome: Ongoing - Unchanged  Goal: Patient will remain free of physical injury  Outcome: Ongoing - Unchanged Problem: Mechanical Ventilation Invasive  Goal: Effective Communication  Outcome: Ongoing - Unchanged  Goal: Optimal Device Function  Outcome: Ongoing - Unchanged  Intervention: Optimize Device Care and Function  Recent Flowsheet Documentation  Taken 01/29/2023 1800 by Roselee Culver, RN  Airway/Ventilation Management:   airway patency maintained   pulmonary hygiene promoted  Oral Care: mouth swabbed  Taken 01/29/2023 0800 by Roselee Culver, RN  Airway/Ventilation Management:   airway patency maintained   pulmonary hygiene promoted  Oral Care: mouth swabbed  Airway Safety Measures: suction at bedside  Goal: Mechanical Ventilation Liberation  Outcome: Ongoing - Unchanged  Intervention: Promote Extubation and Mechanical Ventilation Liberation  Recent Flowsheet Documentation  Taken 01/29/2023 0800 by Roselee Culver, RN  Environmental Support:   calm environment promoted   rest periods encouraged  Goal: Optimal Nutrition Delivery  Outcome: Ongoing - Unchanged  Goal: Absence of Device-Related Skin and Tissue Injury  Outcome: Ongoing - Unchanged  Intervention: Maintain Skin and Tissue Health  Recent Flowsheet Documentation  Taken 01/29/2023 1800 by Roselee Culver, RN  Device Skin Pressure Protection: adhesive use limited  Taken 01/29/2023 1600 by Roselee Culver, RN  Device Skin Pressure Protection: adhesive use limited  Taken 01/29/2023 1200 by Roselee Culver, RN  Device Skin Pressure Protection: adhesive use limited  Taken 01/29/2023 1000 by Roselee Culver, RN  Device Skin Pressure Protection: adhesive use limited  Taken 01/29/2023 0800 by Roselee Culver, RN  Device Skin Pressure Protection: adhesive use limited  Goal: Absence of Ventilator-Induced Lung Injury  Outcome: Ongoing - Unchanged  Intervention: Prevent Ventilator-Associated Pneumonia  Recent Flowsheet Documentation  Taken 01/29/2023 1800 by Roselee Culver, RN  Head of Bed [facility]) Positioning: HOB at 30-45 degrees  Oral Care: mouth swabbed  Taken 01/29/2023 0800 by Roselee Culver, RN  Head of Bed [facility]) Positioning: HOB at 30-45 degrees  Oral Care: mouth swabbed     Problem: Skin Injury Risk Increased  Goal: Skin Health and Integrity  Outcome: Ongoing - Unchanged  Intervention: Optimize Skin Protection  Recent Flowsheet Documentation  Taken 01/29/2023 1800 by Roselee Culver, RN  Pressure Reduction Techniques: frequent weight shift encouraged  Head of Bed (HOB) Positioning: HOB at 30-45 degrees  Pressure Reduction Devices: pressure-redistributing mattress utilized  Skin Protection: adhesive use limited  Taken 01/29/2023 1600 by Roselee Culver, RN  Pressure Reduction Techniques: frequent weight shift encouraged  Pressure Reduction Devices: pressure-redistributing mattress utilized  Skin Protection: adhesive use limited  Taken 01/29/2023 1200 by Roselee Culver, RN  Pressure Reduction Techniques: frequent weight shift encouraged  Pressure Reduction Devices: pressure-redistributing mattress utilized  Skin Protection: adhesive use limited  Taken 01/29/2023 1000 by Roselee Culver, RN  Pressure Reduction Techniques: frequent weight shift encouraged  Pressure Reduction Devices: pressure-redistributing mattress utilized  Skin Protection: adhesive use limited  Taken 01/29/2023 0800 by Roselee Culver, RN  Pressure Reduction Techniques:   frequent weight shift encouraged   weight shift assistance provided  Head of Bed (HOB) Positioning: HOB at 30-45 degrees  Pressure Reduction Devices: pressure-redistributing mattress utilized  Skin Protection: adhesive use limited

## 2023-01-30 NOTE — Unmapped (Signed)
MICU Daily Progress Note     Date of Service: 01/30/2023    Problem List:   Principal Problem:    Sepsis with encephalopathy without septic shock (CMS-HCC)  Active Problems:    Type 2 diabetes mellitus with diabetic nephropathy, with long-term current use of insulin (CMS-HCC)    End-stage renal disease (CMS-HCC)    Kidney transplant recipient    Anemia due to stage 5 chronic kidney disease, not on chronic dialysis (CMS-HCC)    Immunosuppressed status (CMS-HCC)    Hypertension    Hyperlipidemia    Chronic idiopathic thrombocytopenia (CMS-HCC)    Acute metabolic encephalopathy    AKI (acute kidney injury) (CMS-HCC)    Acute metabolic acidosis  Resolved Problems:    * No resolved hospital problems. *      Interval history: Julie [name] is a 72 y.o. 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 C/f sepsis 2/2 UTI, on meropenem.      24hr events:   - Afebrile   - Transitioned to 1L [place]  - Continues on meropenem, dec WBC and lactate normal  - Mental status improving, A&Ox4  - Restarted coreg and hydralazine     Neurological   AMS - Acute metabolic encephalopathy w/ fever (improving)  Known history of AMS with fevers, see prior admission notes (Jan, Feb, March, Apr, June). Now weaned off sedation following extubation. A&Ox4 this AM. No new fevers.     Analgesia: No pain issues  RASS at goal? No - adjusting sedation or order to reflect need  [place] Agitation Assessment Scale (RASS) : 0 (01/30/2023  4:00 AM)      Pulmonary   S/p Intubation   Intubated in ED due to concerns of airway protection related to emesis x3 and sudden onset AMS. Extubated 7/7 to [place]. Most recently on 1L [place]. CXR 7/7 with mild bilateral hazy interstitial opacities c/f interstitial pulmonary edema.   - Wean O2 as tolerated     O2 Flow Rate (L/min):  [1 L/min] 1 L/min    Cardiovascular   Hypertension  Elevated BP since admission with MAP in 80s. Holding home meds below in setting of acute illness and concern for bacteremia. SBP to 190; however, not sustained. Most recently, SBP 150s. Will continue to hold home regimen.  - HOLD home spironolactone 25mg  daily  - RESUME home chlorthalidone 25mg  daily  - RESUME home carvedilol 25 mg daily  - RESUME home amlodipine 10mg  daily,  - RESUME home hydralazine 50mg  BID  - HLD: continue home atorvastatin 10 mg daily     Renal   S/p L DDKT 08/06/22 2/2 diabetic nephropathy   Follows with Dr. Elvera Maria. Lowest Cr ~0.95 after DDKT, presumed new baseline between 0.9-1.2. Cr 1.3 on 7/6 admission, now uptrended to 1.92 with minimal UOP. Transplant renal US 7/7 with dec peak systolic velocity of the main renal artery at the anastomosis with overall decreased resistive indices when compared to 12/25/2022. Thin, heterogeneous collection lateral to the transplant kidney, may reflect residua of the perinephric collection noted on prior studies. Urine sediment consistent with mild tubular injury and UTI. Most recently, Cr 1.8 and maintaining appropriate UOP.   - Strict I/Os via Foley  - BMP, Phos, Mag  - Per transplant nephrology: increase Tacro 1.5 PO BID   - Tac trough MWF, goal 5-7   -  Continue home prednisone 5mg  daily.   - Continue reduced cellcept 250mg  BID in setting of infection of unknown origin (home cellcept 500mg  BID)   - HOLD alendronate 70mg  weekly while intubated  - Restart Vit D daily   - Transplant nephrology following    Infectious Disease/Autoimmune   Fever in IC host - Lactic Acidosis - C/f bacteremia   Presented febrile with WBC elevated at 11.5 and lactate to 3.1. Has had 5 admissions this year with similar presentation of AMS and fever found with ESBL E.coli bacteremia 2/2 MDR UTI, see above for further hx. Most recent course of ertapenem completed 01/15/23 for presumed graft pyelonephritis in absence of clear infectious source. Received vancomycin and cefepime in ED. Transitioned to IV meropenem based on prior susceptibilities on arrival to MICU. Pursued comprehensive infectious workup for IC patient with fever. CXR unremark, UA concerning for UTI. Ucx no significant growth. Bcx no growth at 24h. CTAP with stable to mildly decreased size of the crescentic fluid collection within the overlying soft tissues measuring up to 7.4 cm in size lateral to transplant kidney. Limited evaluation for abscess given absence of IV contrast.     ON febrile to 39.3, nontachycardic, and off pressors. WBC dec to 6.8, lactate normal. ICID consulted, continuing meropenem given prior UTI susceptibilities.   - IV meropenem (7/6 - ) -  consider switch to ertapenem 500mg  IV daily. If CRCL >30 then it would be 1g daily.  - VIR consulted for tunneled line for IV abx  - Cont nystatin SS 5ml tid  - EBV result neg  - Daily CBC/ diff  - ICID c/s  - F/up Bcx    - Prophlyaxis:  - Hold TMP+SMX renally dosed equivalent 400/80 MWF x 6 months (end 02/04/2023) iso elevated K+  - Has completed valcyte for 3 months  - Resolved HBV: obtain LFTs monthly and HBV DNA and SAg for any rise in LF     Cultures:  Blood Culture, Routine (no units)   Date Value   01/27/2023 No Growth at 48 hours   01/27/2023 No Growth at 48 hours     Urine Culture, Comprehensive (no units)   Date Value   01/27/2023 NO SIGNIFICANT GROWTH: <1000 CFU/mL     WBC   Date Value   01/30/2023 6.0 10*9/L   01/22/2023 6.3   01/16/2023 3.4 x10E3/uL     WBC Clumps (/HPF)   Date Value   01/27/2023 Few (A)     WBC, UA (/HPF)   Date Value   01/27/2023 >182 (H)          FEN/GI   Chronic constipation  Home bowel regimen senna daily and colace 100mg  PRN.  - Continue daily senna  - Colace PRN    Nutrition:  - PO diet    Provider Malnutrition Assessment:  Body mass index is 23.42 kg/m??.           Heme/Coag   Acute on Chronic Thrombocytopenia  Known chronic thrombocytopenia ~110-130, presented with platelets of 73. Of note, patient has had similar drop in platelets during previous admissions for bacteremia with resolution back to baseline. Suspect drop is in response to acute illness. Plts stable at 60. Plt stable 55.  - F/up Blood smear  - Trend Plt on daily CBC     Anemia on CKD  Baseline Hgb ~10. Presenting Hgb 10.3. Iron panel and ferritin at prior June admission consistent with AoCD. If acute drop, will repeat anemia workup. Hgb dec  to 8.3, most likely dilutional iso 4L LR. Hgb stable 8.1  - Trend Hgb on daily CBC    DVT prophylaxis  - subcutaneous Heparin 5000 units    Endocrine   T2DM, insulin-dependent  Most recent 12/07/22 A1C 5.8. BG in range.   - SSI    Hypothyroidism: Continue home synthroid 112 mcg daily    Integumentary   NAI    Prophylaxis/LDA/Restraints/Consults   ICU Checklist completed: yes (see ICU rounding navigator in Epic)    Patient Lines/Drains/Airways Status       Active Active Lines, Drains, & Airways       Name Placement date Placement time Site Days    CVC Triple Lumen 01/28/23 Non-tunneled Left Internal jugular 01/28/23  0016  Internal jugular  2    Arteriovenous Fistula - Vein Graft  Access 08/24/16 Left;Upper Arm 08/24/16  --  Arm  2350                  Patient Lines/Drains/Airways Status       Active Wounds       None                    Goals of Care     Code Status: Full Code    Designated Healthcare Decision Maker:  Ms. Gholson's current decisional capacity for healthcare decision-making is Full capacity. Her designated Educational psychologist) is/are   HCDM (patient stated preference) (Active): Taliaferro,[name] - Spouse - [phone].      Subjective     Denies pain. A&Ox4. Mental status at baseline.       Objective     Vitals - past 24 hours  Temp:  [36.8 ??C (98.2 ??F)-37.3 ??C (99.1 ??F)] 37.3 ??C (99.1 ??F)  Heart Rate:  [67-81] 67  SpO2 Pulse:  [67-84] 67  Resp:  [16-27] 18  BP: (142-209)/(15-55) 175/20  SpO2:  [97 %-100 %] 98 % Intake/Output  I/O last 3 completed shifts:  In: 1340 [P.O.:900; I.V.:40; IV Piggyback:400]  Out: 2400 [Urine:2400]     Physical Exam:   General: well-appearing, sitting upright in chair   HEENT: atraumatic, normocephalic   CV: RRR, murmur present - notes she has had that in the past  Pulm: CTAB  GI: soft, NTND  MSK: no LE edema  Neuro: A&Ox4      Continuous Infusions:         Scheduled Medications:    atorvastatin  10 mg Oral Daily    carvedilol  25 mg Oral BID    hydrALAZINE  50 mg Oral BID    insulin lispro  0-20 Units Subcutaneous ACHS    levothyroxine  112 mcg Oral daily    meropenem  500 mg Intravenous Q8H    mycophenolate  250 mg Oral BID    nystatin  500,000 Units Oral Q8H    predniSONE  5 mg Oral Daily    sulfamethoxazole-trimethoprim  1 tablet Oral Q MWF    tacrolimus  1.5 mg Oral BID       PRN medications:  acetaminophen, aluminum-magnesium hydroxide-simethicone, dextrose in water, glucagon, glucose, guaiFENesin, melatonin, ondansetron, senna    Data/Imaging Review: Reviewed in Epic and personally interpreted on 01/30/2023. See EMR for detailed results.

## 2023-01-30 NOTE — Unmapped (Signed)
IMMUNOCOMPROMISED HOST INFECTIOUS DISEASE PROGRESS NOTE    Attending Attestation:  Based on my evaluation, I agree with the findings, assessment, and the plan of care as documented in the advanced practice provider's (APP) note. It was medically necessary for me to see the patient in addition to the APP because of the complexity of the infectious disease. The patient is at risk of decompensation from infection due to underlying immunocompromise and/or mucosal barrier defects. I personally performed the substantive portion of this visit which included a review of this patient's lab results, microbiology data, current medications, and relevant radiological studies for medical decision making .    Pt with hx DDKT 08/06/22 with hx recurrent UTIs +MDR E. Coli and perinephric fluid/stranding of unclear significance. Pt presented with fever and septic shock on admission, but blood cultures and urine culture has had no growth. The perinephric fluid has been present since 3/14 during the time that pt was bacteremic with E Coli, so possibly infected rather than being a hematoma. Please reconsult VIR for possible aspiration to guide long-term IV therapy. Given her clinical improvement and no prior history of PsA infection, would be reasonable to deescalate her antibiotics to ertapenem at this time.     20 minutes spent face-to-face and non-face-to-face in the care of this patient, which includes all pre, intra, and post visit time on the date of service which was specific to E/M visit.     Ellwyn Ergle Se??a, MD MPH  Professor, [facility] Infectious Diseases   Pager [phone]     Assessment/Plan:     Julie [name] is a 72 y.o. 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 Had perinephric drain in place postoperatively, removed 08/30/22  - Immunosuppression: Tacrolimus (goal 5-7), MMF 500 BID (reduced 250mg  bid on 7/6) Pred 5  - Prophylaxis: valganciclovir x 3 months (mod risk, completed), TMP+SMX x 6 months  - baseline Cr ~1.1  - AKI 01/27/23 Estimated Creatinine Clearance: 25.7 mL/min (A) (based on SCr of 1.4 mg/dL (H)).     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 Dubliensis 08/05/22, <10K, negative recipient samples, negative donor blood cx, treated with 2 weeks PO fluconazole  # Positive Hep B sAg and core antibodies, c/w resolved infection, with moderate risk for reactivation 02/2021  - 12/28/22 HBV DNA not detected and HBsAg pending  # Recurrent MDR E.Coli Bacteremia/UTI  - 1/29 BCx/UCx E. Coli - s/p Levo x 3 weeks (thru stent removal, 3/15) (S: Cefazolin, Cipro/Levo, Pip-tazo, Cephalexin; R: TMP-SMX, Amp, Amp-sulb)  - 3/14 BCx/UCx ESBL/FQ-R E. Coli (  S: Erta, Gent, Mero, Tobra, Cefepime SDD; I: ceftaz; R: Cipro/Levo, Cefazolin, Ceftriaxone, Amp) s/p 14 days Meropenem   - 4/13 Bcx/Ucx E. Coli (R CFZ, CTX, cipro/levo, I ceftaz, SDD CFP, susc nitro) s/p mero x 2 weeks  - 5/7 seen by urogyn, no structural issues planned to stop nitrofurantoin with switch to methenamine (not done), added vaginal estrogen/d-mannose.  - 6/3 UCx <10,000 E. Coli (S: Cefepime SDD; Erta/Mero, Gent, Nitrofurantoin, Tobra) s/p ertapenem until 01/15/23  - 4/29 started nitrofurantoin prophylaxis; not restarted after 6/24 due to reduced GFR and breakthrough in 12/2022     Active infection:  #Perinephric fluid collection 10/05/22, slowly improving 01/27/23  - 3/14 BCx/UCx ESBL/FQ-R E. Coli (S: Erta, Gent, Mero, Tobra, Cefepime SDD; I: ceftaz; R: Cipro/Levo, Cefazolin, Ceftriaxone, Amp) s/p 14 days Meropenem   - 3/19 IR guided drainage negative cx, Cr 1.5 not suggestive of urinoma  - 4/13 increased stranding, 4/16 not amenable to intervention per VIR  - 01/27/23 CT a/p 7.4 x 1.0 x 5.4 cm crescentic fluid collection within the soft tissues lateral to the transplant kidney (8.3 x 0.9 x 5.5 cm on 11/05/22)     # Fever and septic shock, presumed urinary source given pyuria and recurrent UTI history 01/27/23  - 7/6 3x emesis in the ED with sudden acute change in mental status, prompting intubation for airway protection --> extubated 7/7  - 7/6 flu/RSV/covid neg; UCx <1,000 CFU (appears on plates as E. Coli); BCx (NGTD)  - 7/6 CXR more likely edema or aspiration then pneumonia  - 7/6 CT A/P: Bibasilar consolidation. No hydronephrosis. No solid renal mass. Atrophic bilateral native kidneys. Similar appearance of nonobstructive punctate calculi and vascular calcifications. Subcentimeter hypoattenuating lesions within the renal transplant, too small to characterize. No hydronephrosis. Similar inflammatory stranding surrounding the transplant kidney, particularly around the hilum. 7.4 x 1.0 x 5.4 cm crescentic fluid collection within the soft tissues lateral to the transplant kidney (8.3 x 0.9 x 5.5 cm on 11/05/22)  - 7/7 renal transplant [place]: The renal transplant was located in the left lower quadrant. Normal size and echogenicity. 2 subcentimeter anechoic cysts are present. There is pelvic caliectasis.   Rx 7/6 vanc/cefepime -> 7/7 Vanc, mero--> 7/8 meropenem        Antimicrobial allergy/intolerance:   Penicillin (see allergy list for nuances)  Valacyclovir - nausea         RECOMMENDATIONS    Diagnosis  F/u 7/6 BCx (NGTD)  Recommend reconsult to VIR for aspiration of fluid collection    Management  Okay to switch from Meropenem to Ertapenem IV  Unclear if pt is having recurrent UTIs or a persistent reservoir based on fluid and stranding around transplanted kidney, that may require long-term IV abx if we cannot find an appropriate oral prophylaxis agent    Antimicrobial prophylaxis required for transplant immunosuppression   Continue tmp-smx  (END: 02/04/23)  Resolved HBV: obtain LFTs monthly and HBV DNA and SAg for any rise in LFTs    Intensive toxicity monitoring for prescription antimicrobials   CBC w/diff at least once per week  CMP at least once per week  clinical assessments for rashes or other skin changes      The [facility] ID APP service will continue to follow.           Please page Darolyn Rua, NP at [phone] from 8-4:30pm, after 4:30 pm & on weekends, please page the ID Transplant/Liquid Oncology Fellow consult at [phone] with questions.    Patient discussed  with Dr. Estevan Ryder.      Darolyn Rua, MSN, APRN, AGNP-C  Immunocompromised Infectious Disease Nurse Practitioner    I personally spent 10 minutes face-to-face and non-face-to-face in the care of this patient, which includes all pre, intra, and post visit time on the date of service.  All documented time was specific to the E/M visit and does not include any procedures that may have been performed.      Subjective:     External record(s): Consultant note(s): Transplant nephrology note reviewed and noted recommendation to increase tacrolimus dose.  .    Independent historian(s): no independent historian required.       Interval History:   Afebrile and NAEON. Foley removed yesterday. Patient denies any urinary symptoms today. She denies shortness of breath, n/v/d. She is on room air.     Medications:  Current Medications as of 01/30/2023  Scheduled  PRN   amlodipine, 10 mg, Daily  atorvastatin, 10 mg, Daily  carvedilol, 25 mg, BID  chlorthalidone, 25 mg, Daily  cholecalciferol (vitamin D3-10 mcg (400 unit)), 50 mcg, Daily  heparin (porcine) for subcutaneous use, 5,000 Units, Q8H SCH  hydrALAZINE, 50 mg, BID  insulin lispro, 0-20 Units, ACHS  levothyroxine, 112 mcg, daily  meropenem, 500 mg, Q8H  mycophenolate, 250 mg, BID  nystatin, 500,000 Units, Q8H  predniSONE, 5 mg, Daily  sulfamethoxazole-trimethoprim, 1 tablet, Q MWF  tacrolimus, 1.5 mg, BID      acetaminophen, 1,000 mg, Q8H PRN  aluminum-magnesium hydroxide-simethicone, 30 mL, Q4H PRN  dextrose in water, 12.5 g, Q10 Min PRN  glucagon, 1 mg, Once PRN  glucose, 16 g, Q10 Min PRN  guaiFENesin, 200 mg, Q4H PRN  melatonin, 3 mg, Nightly PRN  ondansetron, 4 mg, Q6H PRN  senna, 2 tablet, Nightly PRN         Objective:     Vital Signs last 24 hours:  Temp:  [36.7 ??C (98.1 ??F)-37.3 ??C (99.1 ??F)] 37 ??C (98.6 ??F)  Core Temp:  [37.5 ??C (99.5 ??F)] 37.5 ??C (99.5 ??F)  Heart Rate:  [67-81] 69  SpO2 Pulse:  [67-84] 69  Resp:  [16-27] 26  BP: (152-209)/(15-55) 177/33  MAP (mmHg):  [76-104] 89  SpO2:  [97 %-100 %] 100 %    Physical Exam:   Patient Lines/Drains/Airways Status       Active Active Lines, Drains, & Airways       Name Placement date Placement time Site Days    CVC Triple Lumen 01/28/23 Non-tunneled Left Internal jugular 01/28/23  0016  Internal jugular  2    Arteriovenous Fistula - Vein Graft  Access 08/24/16 Left;Upper Arm 08/24/16  --  Arm  2350                  Const [x]  vital signs above    []  NAD, non-toxic appearance []  Chronically ill-appearing, non-distressed  Sitting up in chair      Eyes [x]  Lids normal bilaterally, conjunctiva anicteric and noninjected OU     [] PERRL  [] EOMI        ENMT [x]  Normal appearance of external nose and ears, no nasal discharge        [x]  MMM, no lesions on lips or gums [x]  No thrush, leukoplakia, oral lesions  []  Dentition good []  Edentulous []  Dental caries present  []  Hearing normal  []  TMs with good light reflexes bilaterally         Neck [x]  Neck of normal appearance and trachea midline        []   No thyromegaly, nodules, or tenderness   []  Full neck ROM        Lymph []  No LAD in neck     []  No LAD in supraclavicular area     []  No LAD in axillae   []  No LAD in epitrochlear chains     []  No LAD in inguinal areas        CV [x]  RRR            []  No peripheral edema     []  Pedal pulses intact   []  No abnormal heart sounds appreciated   [x]  Extremities WWP   Murmur present      Resp [x]  Normal WOB at rest    []  No breathlessness with speaking, no coughing  [x]  CTA anteriorly    [x]  CTA posteriorly          GI [x]  Normal inspection, NTND   []  NABS     []  No umbilical hernia on exam       []  No hepatosplenomegaly     []  Inspection of perineal and perianal areas normal        GU []  Normal external genitalia     [] No urinary catheter present in urethra   []  No CVA tenderness    []  No tenderness over renal allograft        MSK []  No clubbing or cyanosis of hands       []  No vertebral point tenderness  []  No focal tenderness or abnormalities on palpation of joints in RUE, LUE, RLE, or LLE        Skin [x]  No rashes, lesions, or ulcers of visualized skin     []  Skin warm and dry to palpation   LIJ with dressing c/d/i      Neuro [x]  Face expression symmetric  []  Sensation to light touch grossly intact throughout    []  Moves extremities equally    []  No tremor noted        []  CNs II-XII grossly intact     []  DTRs normal and symmetric throughout []  Gait unremarkable        Psych [x]  Appropriate affect       []  Fluent speech         [x]  Attentive, good eye contact  [x]  Oriented to person, place, time          []  Judgment and insight are appropriate           Data for Medical Decision Making     01/27/23 EKG QTcF    I discussed mgm't w/qualified health care professional(s) involved in case: discussed with primary team recommendation for VIR consult .    I reviewed CBC results (WBC WNL; Platelets slightly improved from prior), chemistry results (creatinine improved but still above baseline), therapeutic drug levels (tacrolimus level improved), and micro result(s) (blood cultures no growth 48 hours).    I independently visualized/interpreted not done.       Recent Labs     Units 01/27/23  1556 01/27/23  2004 01/28/23  0108 01/28/23  0422 01/30/23  0353   WBC 10*9/L 11.5*  --  9.6   < > 6.0   HGB g/dL 16.1*  --  8.3*   < > 7.8*   PLT 10*9/L 78*  --  60*   < > 60*   NEUTROABS 10*9/L 10.5*  --   --   --   --    LYMPHSABS 10*9/L 0.4*  --   --   --   --  EOSABS 10*9/L 0.0  --   --   --   --    BUN mg/dL 44*   < > 43*   < > 29*   CREATININE mg/dL 1.61*   < > 0.96*   < > 1.40*   AST U/L 12  --  11  --   --    ALT U/L 11  --  8*  --   --    BILITOT mg/dL 0.6  --  0.4  --   --    ALKPHOS U/L 57  --  43*  --   --    K mmol/L 4.1   < > 3.8 - 3.7   < > 4.2   MG mg/dL  --   --   --    < > 2.1   CALCIUM mg/dL 9.5   < > 8.2*   < > 8.5*    < > = values in this interval not displayed.     Lab Results   Component Value Date    TACROLIMUS 5.2 01/30/2023         Microbiology:  Microbiology Results (last day)       Procedure Component Value Date/Time Date/Time    Blood Culture [[id number]]  (Normal) Collected: 01/27/23 1749    Lab Status: Preliminary result Specimen: Blood from 1 Peripheral Draw Updated: 01/29/23 1815     Blood Culture, Routine No Growth at 48 hours    Blood Culture [[id number]]  (Normal) Collected: 01/27/23 1741    Lab Status: Preliminary result Specimen: Blood from 1 Peripheral Draw Updated: 01/29/23 1815     Blood Culture, Routine No Growth at 48 hours            Imaging:  No new imaging

## 2023-01-30 NOTE — Unmapped (Signed)
No acute events this shift. Pt remains calm with no signs of acute distress, VSS. Remains in NSR on cardiac monitor. BP meds given for HTN. Able to maintain oxygen saturations greater than 93% on 1L [place]. Voiding adequate amounts of clear yellow urine. Remains free of fall/injury. See notes and flowsheets for further details.     Problem: Adult Inpatient Plan of Care  Goal: Plan of Care Review  Outcome: Progressing  Goal: Patient-Specific Goal (Individualized)  Outcome: Progressing  Goal: Absence of Hospital-Acquired Illness or Injury  Outcome: Progressing  Intervention: Identify and Manage Fall Risk  Recent Flowsheet Documentation  Taken 01/29/2023 2000 by Kevan Ny, RN  Safety Interventions:   aspiration precautions   bleeding precautions   commode/urinal/bedpan at bedside   fall reduction program maintained   lighting adjusted for tasks/safety   low bed   no IV/BP/blood draw left arm  Intervention: Prevent Skin Injury  Recent Flowsheet Documentation  Taken 01/30/2023 0600 by Kevan Ny, RN  Positioning for Skin: Right  Taken 01/30/2023 0400 by Kevan Ny, RN  Positioning for Skin: Left  Taken 01/30/2023 0200 by Kevan Ny, RN  Positioning for Skin: Right  Taken 01/30/2023 0000 by Kevan Ny, RN  Positioning for Skin: Left  Taken 01/29/2023 2200 by Kevan Ny, RN  Positioning for Skin: Sitting in Chair  Taken 01/29/2023 2000 by Kevan Ny, RN  Positioning for Skin: Sitting in Chair  Device Skin Pressure Protection:   absorbent pad utilized/changed   adhesive use limited   positioning supports utilized   pressure points protected  Skin Protection:   adhesive use limited   cleansing with dimethicone incontinence wipes   pulse oximeter probe site changed   silicone foam dressing in place   transparent dressing maintained  Intervention: Prevent and Manage VTE (Venous Thromboembolism) Risk  Recent Flowsheet Documentation  Taken 01/30/2023 0600 by Kevan Ny, RN  Anti-Embolism Intervention: Other (Comment)  Taken 01/30/2023 0400 by Kevan Ny, RN  Anti-Embolism Intervention: Other (Comment)  Taken 01/30/2023 0200 by Kevan Ny, RN  Anti-Embolism Intervention: Other (Comment)  Taken 01/30/2023 0040 by Kevan Ny, RN  Anti-Embolism Intervention: Other (Comment)  Taken 01/30/2023 0000 by Kevan Ny, RN  Anti-Embolism Intervention: Other (Comment)  Taken 01/29/2023 2200 by Kevan Ny, RN  Anti-Embolism Intervention: Other (Comment)  Taken 01/29/2023 2000 by Kevan Ny, RN  Anti-Embolism Intervention: Other (Comment)  Intervention: Prevent Infection  Recent Flowsheet Documentation  Taken 01/29/2023 2000 by Kevan Ny, RN  Infection Prevention:   cohorting utilized   environmental surveillance performed   equipment surfaces disinfected   hand hygiene promoted   personal protective equipment utilized   rest/sleep promoted  Goal: Optimal Comfort and Wellbeing  Outcome: Progressing  Goal: Readiness for Transition of Care  Outcome: Progressing  Goal: Rounds/Family Conference  Outcome: Progressing

## 2023-01-30 NOTE — Unmapped (Signed)
Social Work  Psychosocial Assessment    Patient Name: Julie [name]   Medical Record Number: [id number]   Date of Birth: 1951/02/12  Sex: Female     Referral  Referred by: Care Manager  Reason for Referral: Complex Discharge Planning  No Psychosocial Interventions Necessary: No Psychosocial Interventions Necessary    Extended Emergency Contact Information  Primary Emergency Contact: Super,Christie  Mobile Phone: [phone]  Relation: Daughter  Secondary Emergency Contact: Gwenlyn [place]  Mobile Phone: [phone]  Relation: Spouse    Legal Next of Kin / Guardian / POA / Advance Directives    HCDM (patient stated preference) (Active): Clemence,[name] - Spouse - [phone]    Advance Directive (Medical Treatment)  Does patient have an advance directive covering medical treatment?: Patient has advance directive covering medical treatment, copy not in chart.    Health Care Decision Maker [HCDM] (Medical & Mental Health Treatment)  Healthcare Decision Maker: HCDM documented in the HCDM/Contact Info section.  Information offered on HCDM, Medical & Mental Health advance directives:: Patient has been deemed unable to make medical decisions, cannot communicate.         Discharge Planning  Discharge Planning Information:   Type of Residence   Mailing Address:  [address] 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)     since July 2017    Hypertension     Hypothyroidism (acquired)     Kidney transplant status, cadaveric 08/08/2022    Type 2 diabetes mellitus (CMS-HCC)        Past Surgical History:   Procedure Laterality Date    CATARACT EXTRACTION      HYSTERECTOMY      OOPHORECTOMY      PERITONEAL CATHETER INSERTION      PR LAP INSERTION TUNNELED INTRAPERITONEAL CATHETER N/A 09/27/2018    Procedure: LAPAROSCOPY, SURGICAL; WITH INSERTION OF INTRAPERITONEAL CANNULA OR CATHETER, PERMANENT;  Surgeon: Leona Carry, MD;  Location: MAIN OR [place];  Service: Transplant    PR LAP INSERTION TUNNELED INTRAPERITONEAL CATHETER N/A 04/08/2019    Procedure: LAPAROSCOPY, SURGICAL; WITH INSERTION OF INTRAPERITONEAL CANNULA OR CATHETER, PERMANENT;  Surgeon: Leona Carry, MD;  Location: MAIN OR [place];  Service: Transplant    PR LAP REVISE INTRAPERITONEAL CATHETER N/A 08/26/2019    Procedure: LAPAROSCOPY, SURGICAL; W/REVIS PREV PLACED INTRAPERITONEAL CANNULA/CATH, REMOV INTRALUMIN OBSTRUCT MATERIAL;  Surgeon: Leona Carry, MD;  Location: MAIN OR [facility];  Service: Transplant    PR REDUCE VOLVULUS,INTUSS,INTERN HERNIA N/A 10/13/2022    Procedure: REDUCTION OF VOLVULUS, INTUSSUSCEPTION, INTERNAL HERNIA, BY LAPAROTOMY;  Surgeon: Suella Broad, MD;  Location: MAIN OR [place];  Service: Trauma    PR REMOVAL TUNNELED INTRAPERITONEAL CATHETER N/A 05/06/2018    Procedure: REMOVAL OF PERMANENT INTRAPERITONEAL CANNULA OR CATHETER;  Surgeon: Leona Carry, MD;  Location: MAIN OR [facility];  Service: Transplant    PR REMOVE PERITONEAL FOREIGN BODY N/A 04/08/2019    Procedure: Removal Of Peritoneal Of Foreign Body From Peritoneal Cavity;  Surgeon: Leona Carry, MD;  Location: MAIN OR [facility];  Service: Transplant    PR TRANSPLANT,PREP RENAL GRAFT/ARTERIAL Right 08/06/2022    Procedure: [facility] RECONSTRUCTION CADAVER/LIVING DONOR RENAL ALLOGRAFT PRIOR TO TRANSPLANT; ARTERIAL ANASTOMOSIS EAC;  Surgeon: Norma Fredrickson, Lilyan Punt, MD;  Location: MAIN OR [place];  Service: Transplant    PR TRANSPLANTATION OF KIDNEY Right 08/06/2022    Procedure: RENAL ALLOTRANSPLANTATION, IMPLANTATION OF GRAFT; WITHOUT RECIPIENT NEPHRECTOMY;  Surgeon: Toledo, Lilyan Punt, MD;  Location: MAIN OR [facility];  Service: Transplant    TUBAL LIGATION         Family History   Problem Relation Age of Onset    Diabetes Mother Alzheimer's disease Father     Diabetes Sister     Diabetes Sister     Diabetes Sister     Anesthesia problems Neg Hx     Bleeding Disorder Neg Hx        Financial Information   Primary Insurance: Payor: HUMANA MEDICARE ADV / Plan: HUMANA GOLD PLUS HMO / Product Type: *No Product type* /    Secondary Insurance: None   Prescription Coverage: Medicare D     Preferred Pharmacy: [id number] - [place],  - [address]  [place] [facility]  [facility] [place] [facility]  [facility]    Barriers to taking medication: No    Transition Home   Transportation at time of discharge: Family/Friend's Private Vehicle    Anticipated changes related to Illness:  possible resumption of HH?   Services in place prior to admission: Home Based Services: [facility] RN   Services anticipated for DC: Home Based Services: possible resumption of HI   Hemodialysis Prior to Admission: No    Readmission  Risk of Unplanned Readmission Score: UNPLANNED READMISSION SCORE: 42.08%  Readmitted Within the Last 30 Days? Yes  Readmission Factors include: other: w/in 1 year of txp    Social Determinants of Health  Social Determinants of Health were addressed in provider documentation.  Please refer to patient history.    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  Chemical Dependency: None              Outpatient Providers: Specialist   Name / Contact #: :  Txp  Legal: No legal issues      Ability to [employer] Services: No issues accessing community services      _________________  Met w/ pt and spouse at bedside.  Pt off intubation and seems alert/oriented. Anxious to go home as soon as possible.  Spent some time w/ both pt and spouse.  Encouraged pt to allow [place] some time to see what is going on.  Appears to have recently ended last course of HI on 6/24 and could possibly need another round of HI.    No specific needs/questions at this time.  Will continue to follow as needed.

## 2023-01-30 NOTE — Unmapped (Signed)
PHYSICAL THERAPY  Evaluation (01/30/23 1009)          Patient Name:  Julie [name]       Medical Record Number: [id number]   Date of Birth: 21-Dec-1950  Sex: Female        Post-Discharge Physical Therapy Recommendations:  PT Post Acute Discharge Recommendations: 3x weekly   PT DME Recommendations: None (owns RW, rec use)           Treatment Diagnosis: impaired balance/gait instability     Activity Tolerance: Tolerated treatment well     ASSESSMENT  Problem List: Impaired balance, Gait deviation, Fall risk, Decreased safety awareness, Decreased mobility      Assessment : Rivky Clendenning is a 72 y.o. 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 C/f sepsis 2/2 UTI, on meropenem.  Pt presents to PT with deficits in functional mobility 2/2 impaired balance and gait instability, further limited by impulsivity and decreased safety awareness.  Pt will benefit from skilled acute PT to address mobility deficits, recommend post-acute PT 3x/wk and RW at discharge (pt owns). After a review of the personal factors, comorbidities, clinical presentation, and examination of the number of affected body systems, the patient presents as a moderate complexity case.       Today's Interventions: Eval, ther act, transfers and ambulation, pt ed: role of PT, ambulation with supervision, insight into deficits, all questions answered.                          PLAN  Planned Frequency of Treatment: Plan of Care Initiated: 01/30/23  1-2x per day for: 3-4x week  Planned Treatment Duration: 02/13/23     Planned Interventions: Education (Patient/Family/Caregiver), Therapeutic Activity, Gait training, Home exercise program, Self-care / Home Management training, Therapeutic Exercise     Goals:   Patient and Family Goals: to return home     SHORT GOAL #1: Pt will perform all transfers indep with no device               Time Frame : 1 week  SHORT GOAL #2: Pt will ambulate 300 ft indep with no device.              Time Frame : 2 weeks  SHORT GOAL #3: Pt will negotiate 3 steps with single railing and CGA.              Time Frame : 2 weeks                                           Long Term Goal #1: Pt will score 24/24 on AMPAC  Time Frame: 4 weeks     Prognosis:  Good  Positive Indicators: PLOF, participation/motivation  Barriers to Discharge: Decreased safety awareness, Gait instability, Impaired Balance     SUBJECTIVE  Patient reports: Why do I need physical therapy? I'm bored        Prior Functional Status: PTA, pt was indep with mobility and ADL, enjoys gardening (fruits/veg).  Equipment available at home: Rollator, Paediatric nurse with back, Rolling walker        Past Medical History:   Diagnosis Date    Anemia  Anemia due to stage 5 chronic kidney disease, not on chronic dialysis (CMS-HCC) 08/08/2022    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)     since July 2017    Hypertension     Hypothyroidism (acquired)     Kidney transplant status, cadaveric 08/08/2022    Type 2 diabetes mellitus (CMS-HCC)             Social History     Tobacco Use    Smoking status: Former     Current packs/day: 0.00     Average packs/day: 0.8 packs/day for 30.0 years (22.5 ttl pk-yrs)     Types: Cigarettes     Start date: 06/06/1976     Quit date: 06/06/2006     Years since quitting: 16.6    Smokeless tobacco: Former   Substance Use Topics    Alcohol use: Yes     Comment: only special occasions       Past Surgical History:   Procedure Laterality Date    CATARACT EXTRACTION      HYSTERECTOMY      OOPHORECTOMY      PERITONEAL CATHETER INSERTION      PR LAP INSERTION TUNNELED INTRAPERITONEAL CATHETER N/A 09/27/2018    Procedure: LAPAROSCOPY, SURGICAL; WITH INSERTION OF INTRAPERITONEAL CANNULA OR CATHETER, PERMANENT;  Surgeon: Leona Carry, MD;  Location: MAIN OR [facility]; Service: Transplant    PR LAP INSERTION TUNNELED INTRAPERITONEAL CATHETER N/A 04/08/2019    Procedure: LAPAROSCOPY, SURGICAL; WITH INSERTION OF INTRAPERITONEAL CANNULA OR CATHETER, PERMANENT;  Surgeon: Leona Carry, MD;  Location: MAIN OR [place];  Service: Transplant    PR LAP REVISE INTRAPERITONEAL CATHETER N/A 08/26/2019    Procedure: LAPAROSCOPY, SURGICAL; W/REVIS PREV PLACED INTRAPERITONEAL CANNULA/CATH, REMOV INTRALUMIN OBSTRUCT MATERIAL;  Surgeon: Leona Carry, MD;  Location: MAIN [facility];  Service: Transplant    PR REDUCE VOLVULUS,INTUSS,INTERN HERNIA N/A 10/13/2022    Procedure: REDUCTION OF VOLVULUS, INTUSSUSCEPTION, INTERNAL HERNIA, BY LAPAROTOMY;  Surgeon: Suella Broad, MD;  Location: MAIN OR [place];  Service: Trauma    PR REMOVAL TUNNELED INTRAPERITONEAL CATHETER N/A 05/06/2018    Procedure: REMOVAL OF PERMANENT INTRAPERITONEAL CANNULA OR CATHETER;  Surgeon: Leona Carry, MD;  Location: MAIN OR [facility];  Service: Transplant    PR REMOVE PERITONEAL FOREIGN BODY N/A 04/08/2019    Procedure: Removal Of Peritoneal Of Foreign Body From Peritoneal Cavity;  Surgeon: Leona Carry, MD;  Location: MAIN OR [facility];  Service: Transplant    PR TRANSPLANT,PREP RENAL GRAFT/ARTERIAL Right 08/06/2022    Procedure: [facility] RECONSTRUCTION CADAVER/LIVING DONOR RENAL ALLOGRAFT PRIOR TO TRANSPLANT; ARTERIAL ANASTOMOSIS EAC;  Surgeon: Toledo, Lilyan Punt, MD;  Location: MAIN OR [facility];  Service: Transplant    PR TRANSPLANTATION OF KIDNEY Right 08/06/2022    Procedure: RENAL ALLOTRANSPLANTATION, IMPLANTATION OF GRAFT; WITHOUT RECIPIENT NEPHRECTOMY;  Surgeon: Toledo, Lilyan Punt, MD;  Location: MAIN OR [facility];  Service: Transplant    TUBAL LIGATION               Family History   Problem Relation Age of Onset    Diabetes Mother     Alzheimer's disease Father     Diabetes Sister     Diabetes Sister     Diabetes Sister     Anesthesia problems Neg Hx     Bleeding Disorder Neg Hx Allergies: Gabapentin and Valacyclovir                  Objective Findings  Precautions / Restrictions  Precautions: Falls precautions  Weight Bearing Status: Non-applicable  Required Braces or Orthoses: Non-applicable     Communication Preference: Verbal          Pain Comments: denies  Medical Tests / Procedures: vitals, labs, orders reviewed in Epic  Equipment / Environment: Vascular access (PIV, TLC, Port-a-cath, PICC), Telemetry     Vitals/Orthostatics : VSS on RA t/o     Living Situation  Living Environment: House  Lives With: Spouse  Home Living: One level home, Stairs to enter with rails, Tub/shower unit, Standard height toilet, Shower chair with back, Walk-in shower  Rail placement (outside): Rail on right side  Number of Stairs to Enter (outside): 3  Caregiver Identified?: Yes  Caregiver Availability: 24 hours (does go out for a few hours at a time)  Caregiver Ability: Limited lifting      Cognition comment: follows commands appropriately, mildly impulsive, requires intermittent redirection to answer questions appropriately  Visual/Perception: Within Functional Limits  Hearing: Hearing impairment  Hearing: HOH     Skin Inspection: Intact where visualized     Upper Extremities  UE ROM: Right WFL, Left WFL  UE Strength: Right WFL, Left WFL    Lower Extremities  LE ROM: Right WFL, Left WFL  LE Strength: Right WFL, Left WFL               Sitting Balance comments: static sitting indep    Standing Balance comments: static standing with SBA and no device      Bed Mobility comments: NT 2/2 presents sitting in chair     Transfer comments: sit<>stand with SBA and no device      Gait Level of Assistance: Contact guard assist, steadying assist, Minimal assist, patient does 75% or more, Standby assist, set-up cues, supervision of patient - no hands on  Gait Distance Ambulated (ft): 200 ft  Skilled Treatment Performed: amb ~200 ft with SBA-CGA, wide BOS, 2 small LOB requiring minA to correct, mostly with head turns.     Stairs: not addressed this session                 Patient at end of session: All needs in reach, In chair, Friends/Family present, Lines intact, Nurse notified    Physical Therapy Session Duration  PT Individual [mins]: 10  PT Co-Treatment [mins]: 16 (seen with Dayton Martes, OT)     Medical Staff Made Aware: RN    I attest that I have reviewed the above information.  Signed: Aldean Ast, PT  Filed 01/30/2023

## 2023-01-30 NOTE — Unmapped (Addendum)
Transplant Nephrology Consult     Requesting provider: Dr. Colon Branch  Service requesting consult: MICU  Reason for consult: s/p kidney transplant    Assessment/Recommendations: Julie [name] is a 72 y.o. 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    # Status post kidney transplant, Allograft Function (unstable):   - b/l recently ~0.9-1.2 : went up to 1.9 and now down to 1.5  Sediment examined with numerous WBCs, 3 monomorphic RBCs per hpf, few finely granular casts and few RTEs, consistent with mild tubular injury and UTI  - will trend after volume repletion   - follow up EBV, CMV PCR from 7/7    # Immunosuppression [High risk medical decision making for drug therapy requiring intensive monitoring for toxicity]  (Home regimen: pred 5 daily, cellcept 500 bid, prograf 1/0.5 goal of 5-7). Planned for belatacept switch outpatient  -continue cellcept at reduced dose of 250mg  bid while being treated for infection  - continue prednisone 5mg , tacrolimus 1mg /0.5mg   -Tac trough level is 1.9 and has been low over the last two days also and is a true trough; will increase Tacrolimus to 1.5mg  PO BID    # Infectious Prophylaxis and Monitoring:   -CMV D+/R+, EBV D+/R+ - completed prophylaxis for all except PJP - can switch to atovaquone if tolerated for last week (end 02/04/2023)  - f/u PCR of both  - currently being treated for recurrent UTI (despite suppressive therapy) though fluid collection peri-transplant appears to be stable  - moderate risk for Hep B reactivation - LFTs, HBV DNA, HBsAg q 3 months    # Blood Pressure / Volume:  - Blood Pressure: home regimen of coreg 25bid, chlorthalidone 25mg , hydralazine 50mg  bid, spironolactone 25mg  ,amlodipine 5mg  bid. All held appropriately given hypotension this AM  - Volume Status: close to euvolemic    #Acid/Base/Electrolytes:   - mild NAGMA  Lab Results   Component Value Date    K 4.6 01/29/2023    K 4.0 01/29/2023    Lab Results   Component Value Date    CO2 20.0 01/29/2023    CO2 19.0 (L) 01/29/2023        # c/f UTI  # AMS s/p intubation:   -planned for extubation today    **Transplant patients with an open wound require wound care with sterile water only. The patient should be counseled on this at the time of discharge if they have not already been doing this.**         Thank you for this consult.   Please page the renal fellow on call with questions/concerns.    Carney Bern, MD  Division of Nephrology and Hypertension  [facility]  01/29/2023      _____________________________________________________________________________________    Transplant Background  Date of Transplant: 08/06/2022 (Kidney)  Type of Transplant: DCD  KDPI: 56%  Cold ischemic time: 877 minutes (14 hr 37 min)  Warm ischemic time: 32 minutes  cPRA: 0%  HLA match:   Blood type: Donor A1, Recipient A POS  ID: CMV D+/R+, EBV D+/R+  Native Kidney Disease: Diabetes              Native kidney biopsy: Diabetic nephropathy (done elsewhere)              Pre-transplant dialysis course: PD started July 2017, transitioned to  HD October 2019 due to catheter dysfunction/inadequate dialysis  Prior Transplants: None  Induction: Basiliximab  Early steroid withdrawal: No     Biopsies:   None    History of Present Illness: Julie [name] is a/an 72 y.o. 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    Pt intubated so hx obtained from husband and chart. She was recently admitted for MDR UTI and subsequently had fever to 101 with chills along with vomiting. Per her husband she had some spells of nonresponsiveness which was unusual for her and in the ED, given vomiting and those spells, was ultimately intubated for airway protection. She briefly required pressors but not off.    UA with numerous WBCs, bacteria. Has previously grown multidrug resistant E.coli.  Most recently admitted to [facility] 12/2022  for fever and treated for presumptive pyelonephritis. Powerline for IV antibiotics was removed 7/5.      Medications:   Current Facility-Administered Medications   Medication Dose Route Frequency Provider Last Rate Last Admin    acetaminophen (TYLENOL) tablet 1,000 mg  1,000 mg Oral Q8H PRN Trina Ao, MD   1,000 mg at 01/29/23 0542    aluminum-magnesium hydroxide-simethicone (MAALOX MAX) 80-80-8 mg/mL oral suspension  30 mL Oral Q4H PRN Bernita Raisin, MD        atorvastatin (LIPITOR) tablet 10 mg  10 mg Oral Daily Bernita Raisin, MD   10 mg at 01/29/23 0839    dextrose (D10W) 10% bolus 125 mL  12.5 g Intravenous Q10 Min PRN Bernita Raisin, MD        glucagon injection 1 mg  1 mg Intramuscular Once PRN Bernita Raisin, MD        glucose chewable tablet 16 g  16 g Oral Q10 Min PRN Bernita Raisin, MD        guaiFENesin (ROBITUSSIN) oral syrup  200 mg Oral Q4H PRN Bernita Raisin, MD        insulin lispro (HumaLOG) injection 0-20 Units  0-20 Units Subcutaneous Darnelle Maffucci Bernita Raisin, MD   2 Units at 01/29/23 1623    levothyroxine (SYNTHROID) tablet 112 mcg  112 mcg Oral daily Trina Ao, MD   112 mcg at 01/29/23 0542    melatonin tablet 3 mg  3 mg Oral Nightly PRN Bernita Raisin, MD        meropenem (MERREM) 500 mg in sodium chloride 0.9 % (NS) 100 mL IVPB-MBP  500 mg Intravenous Q8H Jaynie Collins, MD   Stopped at 01/29/23 1511    mycophenolate (CELLCEPT) capsule 250 mg  250 mg Oral BID Trina Ao, MD   250 mg at 01/29/23 0839    nystatin (MYCOSTATIN) oral suspension  500,000 Units Oral Q8H Park Breed, MD   500,000 Units at 01/29/23 1507    ondansetron (ZOFRAN-ODT) disintegrating tablet 4 mg  4 mg Oral Q6H PRN Trina Ao, MD        predniSONE (DELTASONE) tablet 5 mg  5 mg Oral Daily Myriam Jacobson Jae, DO   5 mg at 01/29/23 1610    senna (SENOKOT) tablet 2 tablet  2 tablet Oral Nightly PRN Bernita Raisin, MD        sulfamethoxazole-trimethoprim (BACTRIM) 400-80 mg tablet 80 mg of trimethoprim  1 tablet Oral Q MWF Jaynie Collins, MD   80 mg of trimethoprim at 01/29/23 1200  tacrolimus (PROGRAF) capsule 0.5 mg  0.5 mg Oral Nightly Myriam Jacobson Jae, DO   0.5 mg at 01/28/23 2028    tacrolimus (PROGRAF) capsule 1 mg  1 mg Oral Daily Myriam Jacobson Jae, DO   1 mg at 01/29/23 1610        ALLERGIES  Gabapentin and Valacyclovir    MEDICAL HISTORY  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)     since July 2017    Hypertension     Hypothyroidism (acquired)     Kidney transplant status, cadaveric 08/08/2022    Type 2 diabetes mellitus (CMS-HCC)         SOCIAL HISTORY  Social History     Socioeconomic History    Marital status: Married     Spouse name: Floraine Buechler    Number of children: 2   Occupational History    Occupation: retired   Tobacco Use    Smoking status: Former     Current packs/day: 0.00     Average packs/day: 0.8 packs/day for 30.0 years (22.5 ttl pk-yrs)     Types: Cigarettes     Start date: 06/06/1976     Quit date: 06/06/2006     Years since quitting: 16.6    Smokeless tobacco: Former   Substance and Sexual Activity    Alcohol use: Yes     Comment: only special occasions    Drug use: No   Social History Narrative    Lives in [place] with her husband. Moved from [place] to the [place] when she was around 19.     Social Determinants of Health     Financial Resource Strain: Low Risk  (11/17/2022)    Received from [facility], [facility]    Overall Financial Resource Strain (CARDIA)     Difficulty of Paying Living Expenses: Not hard at all Food Insecurity: No Food Insecurity (11/17/2022)    Received from [facility], [facility]    Hunger Vital Sign     Worried About Running Out of Food in the Last Year: Never true     Ran Out of Food in the Last Year: Never true   Transportation Needs: No Transportation Needs (11/17/2022)    Received from [facility], [facility]    [facility] - Transportation     In the past 12 months, has lack of transportation kept you from medical appointments or from getting medications?: No     Lack of Transportation (Non-Medical): No   Social Connections: Unknown (04/06/2018)    Received from [facility], [facility]    Social Connection and Isolation Panel [NHANES]     Marital Status: Married        FAMILY HISTORY  Family History   Problem Relation Age of Onset    Diabetes Mother     Alzheimer's disease Father     Diabetes Sister     Diabetes Sister     Diabetes Sister     Anesthesia problems Neg Hx     Bleeding Disorder Neg Hx       Unable to obtain    REVIEW OF SYSTEMS:   Otherwise as per HPI, all other systems reviewed and negative    PHYSICAL EXAM:   Vitals:  01/29/23 1700   BP: 176/30   Pulse: 72   Resp: 20   Temp:    SpO2: 98%     I/O this shift:  In: 20 [I.V.:20]  Out: 250 [Urine:250]    Intake/Output Summary (Last 24 hours) at 01/29/2023 1745  Last data filed at 01/29/2023 1200  Gross per 24 hour   Intake 360 ml   Output 1200 ml   Net -840 ml     General: tired-appearing, NAD  HEENT: conjunctival anicteric  CV: RRR nl s1/s2 no s3/s4 no m/r, no JVD, no LE edema  Lungs: nl WOB, CTAB w/o adventitious sounds, intubated  Abd: soft, non-tender, non-distended w/o r/g  Skin: no visible lesions or rashes  Psych: alert  Neuro: alert, moving    Test Results:   Recent Labs     Units 01/27/23  1556 01/27/23  2004 01/28/23  0108 01/28/23  0422 01/28/23  1801 01/29/23  0531 01/29/23  1543   NA mmol/L 141   < > 141 - 139   < > 141 142 139   K mmol/L 4.1   < > 3.8 - 3.7 < > 4.0 4.0 4.6   CL mmol/L 112*   < > 113*  --  115* 114* 113*   CO2 mmol/L 20.0   < > 18.0*  --  20.0 19.0* 20.0   BUN mg/dL 44*   < > 43*  --  43* 39* 37*   CREATININE mg/dL 1.61*   < > 0.96*  --  1.97* 1.80* 1.53*   GLU mg/dL 045*   < > 409*  --  811 98 270*   ALBUMIN g/dL 3.9  --  2.7*  --   --   --   --    PROT g/dL 7.1  --  5.2*  --   --   --   --    BILITOT mg/dL 0.6  --  0.4  --   --   --   --    ALKPHOS U/L 57  --  43*  --   --   --   --    ALT U/L 11  --  8*  --   --   --   --    AST U/L 12  --  11  --   --   --   --     < > = values in this interval not displayed.     Recent Labs     Units 01/27/23  1556 01/28/23  0108   ALBUMIN g/dL 3.9 2.7*     Recent Labs     Units 01/27/23  1556 01/28/23  0108 01/29/23  0531   WBC 10*9/L 11.5* 9.6 6.8   RBC 10*12/L 3.25* 2.62* 2.51*   HGB g/dL 91.4* 8.3* 8.1*   HCT % 31.1* 25.0* 23.9*   MCV fL 95.8* 95.3 95.1   MCH pg 31.8 31.6 32.2   MCHC g/dL 78.2 95.6 21.3   RDW % 13.6 13.5 13.5   PLT 10*9/L 78* 60* 55*   MPV fL 9.3 8.8 9.8       Imaging:   Renal US 7/7  Impression   --Decreased peak systolic velocity of the main renal artery at the anastomosis with overall decreased resistive indices when compared to 12/25/2022.   --Thin, heterogeneous collection lateral to the transplant kidney, may reflect residua of the perinephric collection noted on prior studies.     CT A/P non con  Impression  1.  No new fluid collection or abscess identified.   2.  Similar-appearing inflammatory stranding surrounding the left lower quadrant transplant kidney with stable to mildly decreased size of the crescentic fluid collection within the overlying soft tissues measuring up to 7.4 cm in size. There is similar-appearing thickening and inflammatory stranding of the adjacent abdominal muscular/peritoneum. Findings are nonspecific and could potentially reflect an evolving hematoma in the postsurgical setting. Please note that CT cannot accurately assess the sterility of any fluid collection. As such, any potential abscess or superimposed infection is not entirely excluded   3.  Other chronic or incidental findings, as described within the body of the report.       I have personally reviewed all relevant and available outside healthcare records related to this consultation.

## 2023-01-31 LAB — CBC
HEMATOCRIT: 22.4 % — ABNORMAL LOW (ref 34.0–44.0)
HEMOGLOBIN: 7.7 g/dL — ABNORMAL LOW (ref 11.3–14.9)
MEAN CORPUSCULAR HEMOGLOBIN CONC: 34.3 g/dL (ref 32.0–36.0)
MEAN CORPUSCULAR HEMOGLOBIN: 32.1 pg (ref 25.9–32.4)
MEAN CORPUSCULAR VOLUME: 93.6 fL (ref 77.6–95.7)
MEAN PLATELET VOLUME: 8.9 fL (ref 6.8–10.7)
PLATELET COUNT: 74 10*9/L — ABNORMAL LOW (ref 150–450)
RED BLOOD CELL COUNT: 2.4 10*12/L — ABNORMAL LOW (ref 3.95–5.13)
RED CELL DISTRIBUTION WIDTH: 13.4 % (ref 12.2–15.2)
WBC ADJUSTED: 4.1 10*9/L (ref 3.6–11.2)

## 2023-01-31 LAB — BASIC METABOLIC PANEL
ANION GAP: 5 mmol/L (ref 5–14)
BLOOD UREA NITROGEN: 26 mg/dL — ABNORMAL HIGH (ref 9–23)
BUN / CREAT RATIO: 21
CALCIUM: 8.5 mg/dL — ABNORMAL LOW (ref 8.7–10.4)
CHLORIDE: 111 mmol/L — ABNORMAL HIGH (ref 98–107)
CO2: 23 mmol/L (ref 20.0–31.0)
CREATININE: 1.24 mg/dL — ABNORMAL HIGH
EGFR CKD-EPI (2021) FEMALE: 46 mL/min/{1.73_m2} — ABNORMAL LOW (ref >=60)
GLUCOSE RANDOM: 149 mg/dL (ref 70–179)
POTASSIUM: 4.5 mmol/L (ref 3.4–4.8)
SODIUM: 139 mmol/L (ref 135–145)

## 2023-01-31 LAB — MAGNESIUM: MAGNESIUM: 1.9 mg/dL (ref 1.6–2.6)

## 2023-01-31 LAB — TACROLIMUS LEVEL, TROUGH: TACROLIMUS, TROUGH: 8.9 ng/mL (ref 5.0–15.0)

## 2023-01-31 MED ADMIN — nystatin (MYCOSTATIN) oral suspension: 500000 [IU] | ORAL | @ 10:00:00 | Stop: 2023-02-04

## 2023-01-31 MED ADMIN — hydrALAZINE (APRESOLINE) tablet 50 mg: 50 mg | ORAL | @ 13:00:00

## 2023-01-31 MED ADMIN — heparin (porcine) 5,000 unit/mL injection 5,000 Units: 5000 [IU] | SUBCUTANEOUS | @ 01:00:00

## 2023-01-31 MED ADMIN — tacrolimus (PROGRAF) 1.5mg combo product: 1.5 mg | ORAL | @ 13:00:00 | Stop: 2023-01-31

## 2023-01-31 MED ADMIN — atorvastatin (LIPITOR) tablet 10 mg: 10 mg | ORAL | @ 13:00:00

## 2023-01-31 MED ADMIN — insulin lispro (HumaLOG) injection 0-20 Units: 0-20 [IU] | SUBCUTANEOUS | @ 20:00:00

## 2023-01-31 MED ADMIN — ertapenem (INVANZ) 500 mg in sodium chloride (NS) 0.9 % 50 mL IVPB: 500 mg | INTRAVENOUS | @ 01:00:00 | Stop: 2023-02-13

## 2023-01-31 MED ADMIN — heparin (porcine) 5,000 unit/mL injection 5,000 Units: 5000 [IU] | SUBCUTANEOUS | @ 18:00:00

## 2023-01-31 MED ADMIN — spironolactone (ALDACTONE) tablet 25 mg: 25 mg | ORAL | @ 16:00:00

## 2023-01-31 MED ADMIN — chlorthalidone (HYGROTON) tablet 25 mg: 25 mg | ORAL | @ 13:00:00

## 2023-01-31 MED ADMIN — carvedilol (COREG) tablet 25 mg: 25 mg | ORAL | @ 13:00:00

## 2023-01-31 MED ADMIN — levothyroxine (SYNTHROID) tablet 112 mcg: 112 ug | ORAL | @ 10:00:00

## 2023-01-31 MED ADMIN — predniSONE (DELTASONE) tablet 5 mg: 5 mg | ORAL | @ 13:00:00

## 2023-01-31 MED ADMIN — nystatin (MYCOSTATIN) oral suspension: 500000 [IU] | ORAL | @ 20:00:00 | Stop: 2023-02-04

## 2023-01-31 MED ADMIN — heparin (porcine) 5,000 unit/mL injection 5,000 Units: 5000 [IU] | SUBCUTANEOUS | @ 10:00:00

## 2023-01-31 MED ADMIN — amlodipine (NORVASC) tablet 10 mg: 10 mg | ORAL | @ 13:00:00

## 2023-01-31 MED ADMIN — sulfamethoxazole-trimethoprim (BACTRIM) 400-80 mg tablet 80 mg of trimethoprim: 1 | ORAL | @ 13:00:00 | Stop: 2023-02-05

## 2023-01-31 MED ADMIN — nystatin (MYCOSTATIN) oral suspension: 500000 [IU] | ORAL | @ 01:00:00 | Stop: 2023-02-04

## 2023-01-31 MED ADMIN — mycophenolate (CELLCEPT) capsule 500 mg: 500 mg | ORAL | @ 13:00:00

## 2023-01-31 MED ADMIN — cholecalciferol (vitamin D3 25 mcg (1,000 units)) tablet 50 mcg: 50 ug | ORAL | @ 13:00:00

## 2023-01-31 MED ADMIN — epoetin alfa-EPBX (RETACRIT) injection 10,000 Units: 10000 [IU] | SUBCUTANEOUS | @ 18:00:00 | Stop: 2023-01-31

## 2023-01-31 MED ADMIN — tacrolimus (PROGRAF) 1.5mg combo product: 1.5 mg | ORAL | @ 01:00:00

## 2023-01-31 MED ADMIN — carvedilol (COREG) tablet 25 mg: 25 mg | ORAL | @ 01:00:00

## 2023-01-31 MED ADMIN — mycophenolate (CELLCEPT) capsule 250 mg: 250 mg | ORAL | @ 01:00:00

## 2023-01-31 MED ADMIN — melatonin tablet 3 mg: 3 mg | ORAL | @ 01:00:00

## 2023-01-31 MED ADMIN — hydrALAZINE (APRESOLINE) tablet 50 mg: 50 mg | ORAL | @ 01:00:00

## 2023-01-31 MED ADMIN — insulin lispro (HumaLOG) injection 0-20 Units: 0-20 [IU] | SUBCUTANEOUS | @ 01:00:00

## 2023-01-31 NOTE — Unmapped (Signed)
IMMUNOCOMPROMISED HOST INFECTIOUS DISEASE PROGRESS NOTE    Assessment/Plan:     Julie [name] is a 72 y.o. 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 Had perinephric drain in place postoperatively, removed 08/30/22  - Immunosuppression: Tacrolimus (goal 5-7), MMF 500 BID (reduced 250mg  bid on 7/6) Pred 5  - Prophylaxis: valganciclovir x 3 months (mod risk, completed), TMP+SMX x 6 months  - baseline Cr ~1.1  - [name] 01/27/23 Estimated Creatinine Clearance: 29 mL/min (A) (based on SCr of 1.24 mg/dL (H)).     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 Dubliensis 08/05/22, <10K, negative recipient samples, negative donor blood cx, treated with 2 weeks PO fluconazole  # Positive Hep B sAg and core antibodies, c/w resolved infection, with moderate risk for reactivation 02/2021  - 12/28/22 HBV DNA not detected and HBsAg pending  # Recurrent MDR E.Coli Bacteremia/UTI  - 1/29 BCx/UCx E. Coli - s/p Levo x 3 weeks (thru stent removal, 3/15) (S: Cefazolin, Cipro/Levo, Pip-tazo, Cephalexin; R: TMP-SMX, Amp, Amp-sulb)  - 3/14 BCx/UCx ESBL/FQ-R E. Coli (S: Erta, Gent, Mero, Tobra, Cefepime SDD; I: ceftaz; R: Cipro/Levo, Cefazolin, Ceftriaxone, Amp) s/p 14 days Meropenem   - 4/13 Bcx/Ucx E. Coli (R CFZ, CTX, cipro/levo, I ceftaz, SDD CFP, susc nitro) s/p mero x 2 weeks  - 5/7 seen by urogyn, no structural issues planned to stop nitrofurantoin with switch to methenamine (not done), added vaginal estrogen/d-mannose.  - 6/3 UCx <10,000 E. Coli (S: Cefepime SDD; Erta/Mero, Gent, Nitrofurantoin, Tobra) s/p ertapenem until 01/15/23  - 4/29 started nitrofurantoin prophylaxis; not restarted after 6/24 due to reduced GFR and breakthrough in 12/2022     Active infection:  #Perinephric fluid collection 10/05/22, slowly improving 01/27/23  - 3/14 BCx/UCx ESBL/FQ-R E. Coli (S: Erta, Gent, Mero, Tobra, Cefepime SDD; I: ceftaz; R: Cipro/Levo, Cefazolin, Ceftriaxone, Amp) s/p 14 days Meropenem   - 3/19 IR guided drainage negative cx, Cr 1.5 not suggestive of urinoma  - 4/13 increased stranding, 4/16 not amenable to intervention per VIR  - 01/27/23 CT a/p 7.4 x 1.0 x 5.4 cm crescentic fluid collection within the soft tissues lateral to the transplant kidney (8.3 x 0.9 x 5.5 cm on 11/05/22)  - 7/10 VIR: collection not large enough to aspirate     # Fever and septic shock, presumed urinary source given pyuria and recurrent UTI history 01/27/23  - 7/6 3x emesis in the ED with sudden acute change in mental status, prompting intubation for airway protection --> extubated 7/7  - 7/6 flu/RSV/covid neg; UCx <1,000 CFU (  appears on plates as E. Coli); BCx (NGTD)  - 7/6 CXR more likely edema or aspiration then pneumonia  - 7/6 CT A/P: Bibasilar consolidation. No hydronephrosis. No solid renal mass. Atrophic bilateral native kidneys. Similar appearance of nonobstructive punctate calculi and vascular calcifications. Subcentimeter hypoattenuating lesions within the renal transplant, too small to characterize. No hydronephrosis. Similar inflammatory stranding surrounding the transplant kidney, particularly around the hilum. 7.4 x 1.0 x 5.4 cm crescentic fluid collection within the soft tissues lateral to the transplant kidney (8.3 x 0.9 x 5.5 cm on 11/05/22)  - 7/7 renal transplant [place]: The renal transplant was located in the left lower quadrant. Normal size and echogenicity. 2 subcentimeter anechoic cysts are present. There is pelvic caliectasis.   Rx 7/6 vanc/cefepime -> 7/7 Vanc, mero--> 7/8 meropenem--> 7/9 Ertapenem        Antimicrobial allergy/intolerance:   Penicillin (see allergy list for nuances)  Valacyclovir - nausea         RECOMMENDATIONS    Diagnosis  F/u 7/6 BCx (NGTD)    Management  Continue Ertapenem IV (renally dose)  Duration: minimum 6-8 weeks  Unclear if pt is having recurrent UTIs or a persistent reservoir based on fluid and stranding around transplanted kidney, that may require long-term IV abx if we cannot find an appropriate oral prophylaxis agent    Antimicrobial prophylaxis required for transplant immunosuppression   Continue tmp-smx  (END: 02/04/23)  Resolved HBV: obtain LFTs monthly and HBV DNA and SAg for any rise in LFTs    Intensive toxicity monitoring for prescription antimicrobials   CBC w/diff at least once per week  CMP at least once per week  clinical assessments for rashes or other skin changes      The [facility] ID APP service will continue to follow.           Please page Darolyn Rua, NP at [phone] from 8-4:30pm, after 4:30 pm & on weekends, please page the ID Transplant/Liquid Oncology Fellow consult at [phone] with questions.    Patient discussed with Dr. Estevan Ryder.      Darolyn Rua, MSN, APRN, AGNP-C  Immunocompromised Infectious Disease Nurse Practitioner    I personally spent 10 minutes face-to-face and non-face-to-face in the care of this patient, which includes all pre, intra, and post visit time on the date of service.  All documented time was specific to the E/M visit and does not include any procedures that may have been performed.      Subjective:     External record(s): Consultant note(s): VIR note reviewed and noted that fluid collection is not large enough for aspiration.  .    Independent historian(s): no independent historian required.       Interval History:   Afebrile and NAEON. Patient denies shortness of breath, n/v/d, dysuria, hematuria, bladder pressure.     Medications:  Current Medications as of 01/31/2023  Scheduled  PRN   amlodipine, 10 mg, Daily  atorvastatin, 10 mg, Daily  carvedilol, 25 mg, BID  chlorthalidone, 25 mg, Daily  cholecalciferol (vitamin D3-10 mcg (400 unit)), 50 mcg, Daily  [START ON 02/01/2023] ertapenem, 1 g, Daily  heparin (porcine) for subcutaneous use, 5,000 Units, Q8H SCH  heparin, porcine (PF), 200 Units, Q MWF  hydrALAZINE, 50 mg, BID  insulin glargine, 3 Units, Nightly  insulin lispro, 0-20 Units, ACHS  levothyroxine, 112 mcg, daily  mycophenolate, 500 mg, BID  nystatin, 500,000 Units, Q8H  predniSONE, 5 mg, Daily  spironolactone, 25 mg, Daily  sulfamethoxazole-trimethoprim, 1 tablet,  Q MWF  tacrolimus, 1 mg, BID      acetaminophen, 1,000 mg, Q8H PRN  aluminum-magnesium hydroxide-simethicone, 30 mL, Q4H PRN  dextrose in water, 12.5 g, Q10 Min PRN  glucagon, 1 mg, Once PRN  glucose, 16 g, Q10 Min PRN  guaiFENesin, 200 mg, Q4H PRN  melatonin, 3 mg, Nightly PRN  ondansetron, 4 mg, Q6H PRN  senna, 2 tablet, Nightly PRN         Objective:     Vital Signs last 24 hours:  Temp:  [35.9 ??C (96.6 ??F)-36.8 ??C (98.2 ??F)] 36.8 ??C (98.2 ??F)  Heart Rate:  [62-75] 63  SpO2 Pulse:  [62-74] 63  Resp:  [18-27] 23  BP: (156-196)/(26-89) 156/26  MAP (mmHg):  [76-99] 76  SpO2:  [94 %-100 %] 95 %    Physical Exam:   Patient Lines/Drains/Airways Status       Active Active Lines, Drains, & Airways       Name Placement date Placement time Site Days    CVC Triple Lumen 01/28/23 Non-tunneled Left Internal jugular 01/28/23  0016  Internal jugular  3    Arteriovenous Fistula - Vein Graft  Access 08/24/16 Left;Upper Arm 08/24/16  --  Arm  2351 Const [x]  vital signs above    []  NAD, non-toxic appearance []  Chronically ill-appearing, non-distressed  Sitting up in bed; family at bedside      Eyes [x]  Lids normal bilaterally, conjunctiva anicteric and noninjected OU     [] PERRL  [] EOMI        ENMT [x]  Normal appearance of external nose and ears, no nasal discharge        [x]  MMM, no lesions on lips or gums [x]  No thrush, leukoplakia, oral lesions  []  Dentition good []  Edentulous []  Dental caries present  []  Hearing normal  []  TMs with good light reflexes bilaterally         Neck [x]  Neck of normal appearance and trachea midline        []  No thyromegaly, nodules, or tenderness   []  Full neck ROM        Lymph []  No LAD in neck     []  No LAD in supraclavicular area     []  No LAD in axillae   []  No LAD in epitrochlear chains     []  No LAD in inguinal areas        CV []  RRR            []  No peripheral edema     []  Pedal pulses intact   []  No abnormal heart sounds appreciated   [x]  Extremities WWP   Murmur present      Resp [x]  Normal WOB at rest    []  No breathlessness with speaking, no coughing  []  CTA anteriorly    []  CTA posteriorly          GI [x]  Normal inspection, NTND   []  NABS     []  No umbilical hernia on exam       []  No hepatosplenomegaly     []  Inspection of perineal and perianal areas normal        GU []  Normal external genitalia     [] No urinary catheter present in urethra   []  No CVA tenderness    []  No tenderness over renal allograft        MSK []  No clubbing or cyanosis of hands       []  No vertebral point tenderness  []   No focal tenderness or abnormalities on palpation of joints in RUE, LUE, RLE, or LLE        Skin [x]  No rashes, lesions, or ulcers of visualized skin     []  Skin warm and dry to palpation   LIJ with dressing c/d/i      Neuro [x]  Face expression symmetric  []  Sensation to light touch grossly intact throughout    []  Moves extremities equally    []  No tremor noted        []  CNs II-XII grossly intact     []  DTRs normal and symmetric throughout []  Gait unremarkable        Psych [x]  Appropriate affect       []  Fluent speech         [x]  Attentive, good eye contact  [x]  Oriented to person, place, time          []  Judgment and insight are appropriate           Data for Medical Decision Making     01/27/23 EKG QTcF    I discussed not done.    I reviewed CBC results (Platelets continue to improve slowly), chemistry results (Potassium improved), and micro result(s) (blood cultures no growth 72 hours).    I independently visualized/interpreted not done.       Recent Labs     Units 01/27/23  1556 01/27/23  2004 01/28/23  0108 01/28/23  0422 01/31/23  0353   WBC 10*9/L 11.5*  --  9.6   < > 4.1   HGB g/dL 16.1*  --  8.3*   < > 7.7*   PLT 10*9/L 78*  --  60*   < > 74*   NEUTROABS 10*9/L 10.5*  --   --   --   --    LYMPHSABS 10*9/L 0.4*  --   --   --   --    EOSABS 10*9/L 0.0  --   --   --   --    BUN mg/dL 44*   < > 43*   < > 26*   CREATININE mg/dL 0.96*   < > 0.45*   < > 1.24*   AST U/L 12  --  11  --   --    ALT U/L 11  --  8*  --   --    BILITOT mg/dL 0.6  --  0.4  --   --    ALKPHOS U/L 57  --  43*  --   --    K mmol/L 4.1   < > 3.8 - 3.7   < > 4.5   MG mg/dL  --   --   --    < > 1.9   CALCIUM mg/dL 9.5   < > 8.2*   < > 8.5*    < > = values in this interval not displayed.     Lab Results   Component Value Date    TACROLIMUS 8.9 01/31/2023         Microbiology:  Microbiology Results (last day)       Procedure Component Value Date/Time Date/Time    Blood Culture [[id number]]  (Normal) Collected: 01/27/23 1749    Lab Status: Preliminary result Specimen: Blood from 1 Peripheral Draw Updated: 01/30/23 1815     Blood Culture, Routine No Growth at 72 hours    Blood Culture [[id number]]  (Normal) Collected: 01/27/23 1741    Lab Status: Preliminary result Specimen: Blood from  1 Peripheral Draw Updated: 01/30/23 1815     Blood Culture, Routine No Growth at 72 hours                Imaging:  No new imaging

## 2023-01-31 NOTE — Unmapped (Signed)
Tacrolimus Therapeutic Monitoring Pharmacy Note    Julie [name] is a 72 y.o. female continuing tacrolimus.     Indication: Kidney transplant     Date of Transplant:  08/06/22       Prior Dosing Information: Home regimen 1 mg in am and 0.5 mg in evening . Dose increased on 7/8pm to 1.5mg  bid    Source(s) of information used to determine prior to admission dosing: Home Medication List    Goals:  Therapeutic Drug Levels  Tacrolimus trough goal:  5-7 ng/ml    Additional Clinical Monitoring/Outcomes  Monitor renal function (SCr and urine output) and liver function (LFTs)  Monitor for signs/symptoms of adverse events (e.g., hyperglycemia, hyperkalemia, hypomagnesemia, hypertension, headache, tremor)    Results:   Tacrolimus level:  8.9 ng/mL, drawn appropriately    Pharmacokinetic Considerations and Significant Drug Interactions:  Concurrent hepatotoxic medications: None identified  Concurrent CYP3A4 substrates/inhibitors: None identified  Concurrent nephrotoxic medications: None identified    Assessment/Plan:  Recommendedation(s)  Agree with decrease dose to 1mg  bid today. Will recheck tomorrow and if continues to rise would reduce back to home dose     Follow-up  Daily tac levels have been ordered for now  .   A pharmacist will continue to monitor and recommend levels as appropriate    Please page service pharmacist with questions/clarifications.    Myer Haff Campbell-Bright, RPH

## 2023-01-31 NOTE — Unmapped (Signed)
MICU Daily Progress Note     Date of Service: 01/31/2023    Problem List:  Principal Problem:    Sepsis with encephalopathy without septic shock (CMS-HCC)  Active Problems:    Type 2 diabetes mellitus with diabetic nephropathy, with long-term current use of insulin (CMS-HCC)    End-stage renal disease (CMS-HCC)    Kidney transplant recipient    Anemia due to stage 5 chronic kidney disease, not on chronic dialysis (CMS-HCC)    Immunosuppressed status (CMS-HCC)    Hypertension    Hyperlipidemia    Chronic idiopathic thrombocytopenia (CMS-HCC)    Acute metabolic encephalopathy    AKI (acute kidney injury) (CMS-HCC)    Acute metabolic acidosis  Resolved Problems:    * No resolved hospital problems. *    Interval history: Julie [name] is a 72 y.o. 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 C/f sepsis 2/2 UTI, on meropenem.      24hr events:   - Afebrile   - On RA  - Starting ertapenem today  - Held TMP-SMX iso increased K+, can restart now  - Mental status improving, A&Ox4  - Restarted home blood pressure medications    Neurological   AMS - Acute metabolic encephalopathy w/ fever (improving)  Known history of AMS with fevers, see prior admission notes (Jan, Feb, March, Apr, June). Now weaned off sedation following extubation. A&Ox4 this AM. No new fevers.     Analgesia: No pain issues  RASS at goal? No - adjusting sedation or order to reflect need  [place] Agitation Assessment Scale (RASS) : 0 (01/31/2023  4:00 AM)      Pulmonary   S/p Intubation   Intubated in ED due to concerns of airway protection related to emesis x3 and sudden onset AMS. Extubated 7/7 to [place]. Most recently on 1L . CXR 7/7 with mild bilateral hazy interstitial opacities c/f interstitial pulmonary edema.   - On room air    O2 Flow Rate (L/min):  [1 L/min] 1 L/min    Cardiovascular   Hypertension  Elevated BP since admission with MAP in 80s. Less concern for acute illness and permissible to resume antiHTN medications. Elevated pulse pressure likely secondary to atherosclerotic aorta/stiff vasculature. Persistently elevated blood pressure today 7/10.   - RESTART home spironolactone 25mg  daily  - RESUME home chlorthalidone 25mg  daily  - RESUME home carvedilol 25 mg daily  - RESUME home amlodipine 10mg  daily,  - RESUME home hydralazine 50mg  BID  - HLD: continue home atorvastatin 10 mg daily     Renal   S/p L DDKT 08/06/22 2/2 diabetic nephropathy   Follows with Dr. Elvera Maria. Lowest Cr ~0.95 after DDKT, presumed new baseline between 0.9-1.2. Cr 1.3 on 7/6 admission, now uptrended to 1.92 with minimal UOP. Transplant renal US 7/7 with dec peak systolic velocity of the main renal artery at the anastomosis with overall decreased resistive indices when compared to 12/25/2022. Thin, heterogeneous collection lateral to the transplant kidney, may reflect residua of the perinephric collection noted on prior studies. Urine sediment consistent with mild tubular injury and UTI. Most recently, Cr 1.8 and maintaining appropriate UOP.   - Strict I/Os via Foley  - BMP, Phos, Mag  - Per transplant nephrology: Tacro 1.0 PO BID   - Tac trough MWF, goal 5-7   - Continue home  prednisone 5mg  daily.   - Resume at home cellcept 500 mg BID  - HOLD alendronate 70mg  weekly while intubated  - Restarted Vit D daily   - PVR and urodynamic studies  - Transplant nephrology following    Infectious Disease/Autoimmune   Fever in IC host - Lactic Acidosis - C/f bacteremia   Presented febrile with WBC elevated at 11.5 and lactate to 3.1. Has had 5 admissions this year with similar presentation of AMS and fever found with ESBL E.coli bacteremia 2/2 MDR UTI, see above for further hx. Most recent course of ertapenem completed 01/15/23 for presumed graft pyelonephritis in absence of clear infectious source. Received vancomycin and cefepime in ED. Transitioned to IV meropenem based on prior susceptibilities on arrival to MICU. Pursued comprehensive infectious workup for IC patient with fever. CXR unremark, UA concerning for UTI. Ucx no significant growth. Bcx no growth at 24h. CTAP with stable to mildly decreased size of the crescentic fluid collection within the overlying soft tissues measuring up to 7.4 cm in size lateral to transplant kidney. Limited evaluation for abscess given absence of IV contrast.     ON febrile to 39.3, nontachycardic, and off pressors. WBC dec to 6.8, lactate normal. ICID consulted, continuing meropenem given prior UTI susceptibilities.   - Switch abx to IV ertapenem 1 mg q24h.  - VIR consulted for tunneled line for IV abx  - Cont nystatin SS 5ml tid  - EBV result neg  - Daily CBC/ diff  - ICID c/s  - F/up Bcx    - Prophlyaxis:  - Resume TMP+SMX renally dosed equivalent 400/80 MWF x 6 months (end 02/04/2023)   - Has completed valcyte for 3 months  - Resolved HBV: obtain LFTs monthly and HBV DNA and SAg for any rise in LF     Cultures:  Blood Culture, Routine (no units)   Date Value   01/27/2023 No Growth at 72 hours   01/27/2023 No Growth at 72 hours     Urine Culture, Comprehensive (no units)   Date Value   01/27/2023 NO SIGNIFICANT GROWTH: <1000 CFU/mL     WBC   Date Value   01/31/2023 4.1 10*9/L   01/22/2023 6.3   01/16/2023 3.4 x10E3/uL     WBC Clumps (/HPF)   Date Value   01/27/2023 Few (A)     WBC, UA (/HPF)   Date Value   01/27/2023 >182 (H)          FEN/GI   Chronic constipation  Home bowel regimen senna daily and colace 100mg  PRN.  - Continue daily senna  - Colace PRN    Nutrition:  - PO diet    Provider Malnutrition Assessment:  Body mass index is 23.42 kg/m??.           Heme/Coag   Acute on Chronic Thrombocytopenia  Known chronic thrombocytopenia ~110-130, presented with platelets of 73. Of note, patient has had similar drop in platelets during previous admissions for bacteremia with resolution back to baseline. Suspect drop is in response to acute illness. Plts stable at 60. Plt stable 55.  - F/up Blood smear  - Trend Plt on daily CBC     Anemia on CKD  Baseline Hgb ~10. Presenting Hgb 10.3. Iron panel and ferritin at prior June admission consistent with AoCD. If acute drop, will repeat anemia workup. Hgb dec to 8.3, most likely dilutional iso 4L LR. Hgb stable 8.1  - Trend Hgb on daily CBC    DVT prophylaxis  -  subcutaneous Heparin 5000 units    Endocrine   T2DM, insulin-dependent  Most recent 12/07/22 A1C 5.8. BG in range.   - SSI    Hypothyroidism: Continue home synthroid 112 mcg daily    Integumentary   NAI    Prophylaxis/LDA/Restraints/Consults   ICU Checklist completed: yes (see ICU rounding navigator in Epic)    Patient Lines/Drains/Airways Status       Active Active Lines, Drains, & Airways       Name Placement date Placement time Site Days    CVC Triple Lumen 01/28/23 Non-tunneled Left Internal jugular 01/28/23  0016  Internal jugular  3    Arteriovenous Fistula - Vein Graft  Access 08/24/16 Left;Upper Arm 08/24/16  --  Arm  2351                  Patient Lines/Drains/Airways Status       Active Wounds       None                    Goals of Care     Code Status: Full Code    Designated Healthcare Decision Maker:  Julie [name]'s current decisional capacity for healthcare decision-making is Full capacity. Her designated Educational psychologist) is/are   HCDM (patient stated preference) (Active): Wagler,[name] - Spouse - [phone].      Subjective     Denies pain. A&Ox4. Mental status at baseline.       Objective     Vitals - past 24 hours  Temp:  [36.3 ??C (97.4 ??F)-37 ??C (98.6 ??F)] 36.8 ??C (98.2 ??F)  Heart Rate:  [62-76] 63  SpO2 Pulse:  [62-78] 63  Resp:  [15-27] 19  BP: (144-189)/(22-71) 186/30  SpO2:  [94 %-100 %] 95 % Intake/Output  I/O last 3 completed shifts:  In: 1407 [P.O.:945; IV Piggyback:462]  Out: 3360 [Urine:3360]     Physical Exam:   General: well-appearing, sitting upright in chair   HEENT: atraumatic, normocephalic   CV: RRR, murmur present - notes she has had that in the past  Pulm: CTAB  GI: soft, NTND  MSK: no LE edema  Neuro: A&Ox4      Continuous Infusions:         Scheduled Medications:    amlodipine  10 mg Oral Daily    atorvastatin  10 mg Oral Daily    carvedilol  25 mg Oral BID    chlorthalidone  25 mg Oral Daily    cholecalciferol (vitamin D3-10 mcg (400 unit))  50 mcg Oral Daily    ertapenem  500 mg Intravenous Q24H    heparin (porcine) for subcutaneous use  5,000 Units Subcutaneous Q8H SCH    hydrALAZINE  50 mg Oral BID    insulin lispro  0-20 Units Subcutaneous ACHS    levothyroxine  112 mcg Oral daily    mycophenolate  500 mg Oral BID    nystatin  500,000 Units Oral Q8H    predniSONE  5 mg Oral Daily    sulfamethoxazole-trimethoprim  1 tablet Oral Q MWF    tacrolimus  1.5 mg Oral BID       PRN medications:  acetaminophen, aluminum-magnesium hydroxide-simethicone, dextrose in water, glucagon, glucose, guaiFENesin, melatonin, ondansetron, senna    Data/Imaging Review: Reviewed in Epic and personally interpreted on 01/31/2023. See EMR for detailed results.

## 2023-01-31 NOTE — Unmapped (Signed)
Floor status. RASS 0. Follows commands. Oriented x4. Up to commode with supervision. Denies pain. Room air no SOB. Hypertensive MDI made aware systolics greater than 180. Voiding well. Tolerating PO well. X2 BM via commode. Husband at bedside.   Problem: Adult Inpatient Plan of Care  Goal: Plan of Care Review  Outcome: Ongoing - Unchanged  Goal: Patient-Specific Goal (Individualized)  Outcome: Ongoing - Unchanged  Goal: Absence of Hospital-Acquired Illness or Injury  Outcome: Ongoing - Unchanged  Goal: Optimal Comfort and Wellbeing  Outcome: Ongoing - Unchanged  Goal: Readiness for Transition of Care  Outcome: Ongoing - Unchanged  Goal: Rounds/Family Conference  Outcome: Ongoing - Unchanged

## 2023-01-31 NOTE — Unmapped (Signed)
VASCULAR INTERVENTIONAL RADIOLOGY INPATIENT CVC CONSULTATION     Requesting Attending Physician: Rayetta Pigg, MD  Service Requesting Consult: Medical ICU (MDI)    Date of Service: 01/31/2023  Consulting Interventional Radiologist: Dr. Ammie Dalton     HPI:     Reason for consult: tunneled central venous catheter placement, aspiration of peri-renal fluid collection     History of Present Illness:   Julie [name] is a 72 y.o. 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 She also had elevated WBC on admission. She was placed on IV ABX while infectious workup done. CXR unremarkable, UA c/f UTI but culture without significant growth. Blood cultures remain negative. She is not s/p extubation with AMS resolved. CTAP done 7/6 with stable to mildly decreased size of the crescentic fluid collection within the overlying soft tissues measuring up to 7.4 cm in size lateral to transplant kidney. Limited evaluation for abscess given absence of IV contrast. ID is requesting aspiration of this fluid collection given septic picture on admission with negative urine and blood cultures/to help guide ABX therapy.     Patient seen in consultation at the request of primary care team for consideration for tunneled central venous catheter placement and aspiration of peri-renal fluid collection. Fluid collection is not large enough for VIR to aspirate. We will plan to place tunneled Powerline. She is currently afebrile, VSS on RA. No leukocytosis or pending blood cultures.     Review of Systems:  Pertinent items are noted in HPI.    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)     since July 2017    Hypertension     Hypothyroidism (acquired)     Kidney transplant status, cadaveric 08/08/2022    Type 2 diabetes mellitus (CMS-HCC)        Surgical History:  Past Surgical History:   Procedure Laterality Date    CATARACT EXTRACTION      HYSTERECTOMY      OOPHORECTOMY      PERITONEAL CATHETER INSERTION      PR LAP INSERTION TUNNELED INTRAPERITONEAL CATHETER N/A 09/27/2018    Procedure: LAPAROSCOPY, SURGICAL; WITH INSERTION OF INTRAPERITONEAL CANNULA OR CATHETER, PERMANENT;  Surgeon: Leona Carry, MD;  Location: MAIN OR [place];  Service: Transplant    PR LAP INSERTION TUNNELED INTRAPERITONEAL CATHETER N/A 04/08/2019    Procedure: LAPAROSCOPY, SURGICAL; WITH INSERTION OF INTRAPERITONEAL CANNULA OR CATHETER, PERMANENT;  Surgeon: Leona Carry, MD;  Location: MAIN OR [facility];  Service: Transplant    PR LAP REVISE INTRAPERITONEAL CATHETER N/A 08/26/2019    Procedure: LAPAROSCOPY, SURGICAL; W/REVIS PREV PLACED INTRAPERITONEAL CANNULA/CATH, REMOV INTRALUMIN OBSTRUCT MATERIAL;  Surgeon: Leona Carry, MD;  Location: MAIN OR [facility];  Service: Transplant    PR REDUCE VOLVULUS,INTUSS,INTERN HERNIA N/A 10/13/2022    Procedure: REDUCTION OF VOLVULUS, INTUSSUSCEPTION, INTERNAL HERNIA, BY LAPAROTOMY;  Surgeon: Suella Broad, MD;  Location: MAIN OR [place];  Service: Trauma    PR REMOVAL TUNNELED INTRAPERITONEAL CATHETER N/A 05/06/2018    Procedure: REMOVAL OF PERMANENT INTRAPERITONEAL CANNULA OR CATHETER;  Surgeon: Leona Carry,  MD;  Location: MAIN OR ;  Service: Transplant    PR REMOVE PERITONEAL FOREIGN BODY N/A 04/08/2019    Procedure: Removal Of Peritoneal Of Foreign Body From Peritoneal Cavity;  Surgeon: Leona Carry, MD;  Location: MAIN OR [facility];  Service: Transplant    PR TRANSPLANT,PREP RENAL GRAFT/ARTERIAL Right 08/06/2022    Procedure: [facility] RECONSTRUCTION CADAVER/LIVING DONOR RENAL ALLOGRAFT PRIOR TO TRANSPLANT; ARTERIAL ANASTOMOSIS EAC;  Surgeon: Toledo, Lilyan Punt, MD;  Location: MAIN OR [facility];  Service: Transplant    PR TRANSPLANTATION OF KIDNEY Right 08/06/2022    Procedure: RENAL ALLOTRANSPLANTATION, IMPLANTATION OF GRAFT; WITHOUT RECIPIENT NEPHRECTOMY;  Surgeon: Toledo, Lilyan Punt, MD;  Location: MAIN OR [facility];  Service: Transplant    TUBAL LIGATION         Family History:  Family History   Problem Relation Age of Onset    Diabetes Mother     Alzheimer's disease Father     Diabetes Sister     Diabetes Sister     Diabetes Sister     Anesthesia problems Neg Hx     Bleeding Disorder Neg Hx        Medications:   Current Facility-Administered Medications   Medication Dose Route Frequency Provider Last Rate Last Admin    acetaminophen (TYLENOL) tablet 1,000 mg  1,000 mg Oral Q8H PRN Trina Ao, MD   1,000 mg at 01/29/23 0542    aluminum-magnesium hydroxide-simethicone (MAALOX MAX) 80-80-8 mg/mL oral suspension  30 mL Oral Q4H PRN Bernita Raisin, MD        amlodipine (NORVASC) tablet 10 mg  10 mg Oral Daily Estell Harpin, MD   10 mg at 01/31/23 0849    atorvastatin (LIPITOR) tablet 10 mg  10 mg Oral Daily Bernita Raisin, MD   10 mg at 01/31/23 0849    carvedilol (COREG) tablet 25 mg  25 mg Oral BID Trina Ao, MD   25 mg at 01/31/23 0849    chlorthalidone (HYGROTON) tablet 25 mg  25 mg Oral Daily Estell Harpin, MD   25 mg at 01/31/23 0848    cholecalciferol (vitamin D3 25 mcg (1,000 units)) tablet 50 mcg  50 mcg Oral Daily Anastasio Champion, MD   50 mcg at 01/31/23 0848    dextrose (D10W) 10% bolus 125 mL  12.5 g Intravenous Q10 Min PRN Bernita Raisin, MD        epoetin alfa-EPBX (RETACRIT) injection 10,000 Units  10,000 Units Subcutaneous Once Estell Harpin, MD        [START ON 02/01/2023] ertapenem [facility]) 1 g in sodium chloride 0.9 % (NS) 100 mL IVPB-MBP  1 g Intravenous Daily Choi, Hee Jae, DO        glucagon injection 1 mg  1 mg Intramuscular Once PRN Bernita Raisin, MD        glucose chewable tablet 16 g  16 g Oral Q10 Min PRN Bernita Raisin, MD        guaiFENesin (ROBITUSSIN) oral syrup  200 mg Oral Q4H PRN Bernita Raisin, MD        heparin (porcine) 5,000 unit/mL injection 5,000 Units  5,000 Units Subcutaneous [facility] Myriam Jacobson [place], DO   5,000 Units at 01/31/23 1610    hydrALAZINE (APRESOLINE) tablet 50 mg  50 mg Oral BID Trina Ao, MD   50 mg at 01/31/23 0849    insulin glargine (LANTUS) injection 3 Units  3 Units Subcutaneous Nightly Estell Harpin, MD        insulin lispro (HumaLOG) injection  0-20 Units  0-20 Units Subcutaneous ACHS Bernita Raisin, MD   4 Units at 01/30/23 2102    levothyroxine (SYNTHROID) tablet 112 mcg  112 mcg Oral daily Trina Ao, MD   112 mcg at 01/31/23 1610    melatonin tablet 3 mg  3 mg Oral Nightly PRN Bernita Raisin, MD   3 mg at 01/30/23 2101    mycophenolate (CELLCEPT) capsule 500 mg  500 mg Oral BID Barnett Abu, MD   500 mg at 01/31/23 0848    nystatin (MYCOSTATIN) oral suspension  500,000 Units Oral Q8H Park Breed, MD   500,000 Units at 01/31/23 9604    ondansetron (ZOFRAN-ODT) disintegrating tablet 4 mg  4 mg Oral Q6H PRN Trina Ao, MD        predniSONE (DELTASONE) tablet 5 mg  5 mg Oral Daily Myriam Jacobson Jae, DO   5 mg at 01/31/23 0848    senna (SENOKOT) tablet 2 tablet  2 tablet Oral Nightly PRN Bernita Raisin, MD        spironolactone (ALDACTONE) tablet 25 mg  25 mg Oral Daily Barnett Abu, MD   25 mg at 01/31/23 1136    sulfamethoxazole-trimethoprim (BACTRIM) 400-80 mg tablet 80 mg of trimethoprim  1 tablet Oral Q MWF Barnett Abu, MD   80 mg of trimethoprim at 01/31/23 0849    tacrolimus (PROGRAF) capsule 1 mg  1 mg Oral BID Barnett Abu, MD           Allergies:  Gabapentin and Valacyclovir    Social History:  Social History     Tobacco Use    Smoking status: Former     Current packs/day: 0.00     Average packs/day: 0.8 packs/day for 30.0 years (22.5 ttl pk-yrs)     Types: Cigarettes     Start date: 06/06/1976     Quit date: 06/06/2006     Years since quitting: 16.6    Smokeless tobacco: Former   Substance Use Topics    Alcohol use: Yes     Comment: only special occasions    Drug use: No       Objective:      Vital Signs:  Temp:  [35.9 ??C (96.6 ??F)-36.8 ??C (98.2 ??F)] 36.8 ??C (98.2 ??F)  Heart Rate:  [62-76] 63  SpO2 Pulse:  [62-78] 63  Resp:  [15-27] 23  BP: (144-196)/(22-89) 156/26  MAP (mmHg):  [68-99] 76  SpO2:  [94 %-100 %] 95 %    Physical Exam:      Vitals:    01/31/23 1200   BP: 156/26   Pulse: 63   Resp: 23   Temp:    SpO2: 95%     ASA Grade: ASA 3 - Patient with moderate systemic disease with functional limitations  General: No apparent distress.  Lungs: Breathing comfortably on RA.  Heart:  Regular rate and rhythm.   Neuro: No obvious focal deficits.    Airway assessment: Class 1 - Can visualize soft palate, fauces, uvula, and tonsillar pillars    Diagnostic Studies: CTAP from 01/27/23        Labs:    Recent Labs     01/29/23  0531 01/30/23  0353 01/31/23  0353   WBC 6.8 6.0 4.1   HGB 8.1* 7.8* 7.7*   HCT 23.9* 22.7* 22.4*   PLT 55* 60* 74*     Recent Labs     01/30/23  0353 01/30/23  1541 01/31/23  0353  NA 138 136 139   K 4.2 4.9* 4.5   CL 112* 108* 111*   BUN 29* 28* 26*   CREATININE 1.40* 1.30* 1.24*   GLU 124 302* 149     No results for input(s): PROT, ALBUMIN, AST, ALT, ALKPHOS, BILITOT, BILIDIR in the last 72 hours.  No results for input(s): INR, APTT, FIBRINOGEN in the last 72 hours.    Blood Cultures Pending:  No.    Assessment and Recommendations:     Ms. Towner is a 72 y.o. female with ESRD s/p DDKT 08/06/22, E.coli bacteremia 2/2 MDR UTI presenting to [facility] on 7/6 with chills and fever, c/b emesis and AMS resulting in intubation for airway protection. She also had elevated WBC on admission. She was placed on IV ABX while infectious workup done. CXR unremarkable, UA c/f UTI but culture without significant growth. Blood cultures remain negative. CTAP done 7/6 with stable to mildly decreased size of the crescentic fluid collection within the overlying soft tissues measuring up to 7.4 cm in size lateral to transplant kidney. ID is requesting aspiration of this fluid collection to help guide ABX therapy. Team consulted VIR for tunneled central venous catheter placement and aspiration of peri-renal fluid collection. Fluid collection is not large enough for VIR to aspirate. We will plan to place tunneled Powerline. She is currently afebrile, VSS on RA. No leukocytosis or pending blood cultures.     Recommendations:  - Proceed with placement of Power Line - single lumen  - Anticipated procedure date: 7/15-7/17  - Anticoagulant medication hold required: No.  - Please make NPO night prior to procedure  - Please ensure recent CBC, Creatinine, and INR are available  - Antibiotic required: Yes.   - If yes, patient will receive Ancef 1g IV (weight <80kg) in VIR prior to procedure    Informed Consent:  This procedure and sedation has been fully reviewed with the patient/patient???s authorized representative. The risks, benefits and alternatives including but not limited to infection, bleeding, damage to adjacent structures have been explained, and the patient/patient???s authorized representative has consented to the procedure.  --The patient will accept blood products in an emergent situation.  --The patient does not have a Do Not Resuscitate order in effect.    Consent obtained by Julie Beckwith, FNP, witnessed, and scanned into the medical record.     The patient was discussed with Dr. Ammie Dalton.     Thank you for involving [place] in the care of this patient. Please page the VIR consult pager [phone]) with further questions, concerns, or if new issues arise.

## 2023-01-31 NOTE — Unmapped (Signed)
Transplant Nephrology Follow-Up Consult note      Assessment/Recommendations: Julie [name]  is a 72 y.o. 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     # Status post kidney transplant, Allograft Function (unstable):   DDKT 08/06/2022 2/2 to DM  Follows with Dr. Elvera Maria   Baseline cr 0.9-1.1  Peaked at 1.97, down trending, improving, back to baseline  Adequate UO    #Fever/Septic shock  In the setting of recurrent UTI  ESBL ecoli bacteremia on prior admission  BC/UC negative on this admission, however elevated WBC on UA, bac few  EBV/CMV negative  Originally treated with vanc cefepime, now transition to meropenem due to ongoing febrile illness?  ID following  Foley removed 7/7  PVR around 230 cc, outpatient urodynamic studies recc    #Perinephric fluid collection  CT abd pelvis w/o con . Similar size of the 7.4 x 1.0 x 5.4 cm crescentic fluid collection within the soft tissues lateral to the transplant kidney, previously 8.3 x 0.9 x 5.5 cm (2:84, 4:30). There is increased inflammatory changes and thickening involving the adjacent oblique and transversus abdominis musculature, similar to prior. Thickening of the adjacent peritoneum also appears similar to prior.   drained 10/10/2022 with no growth on culture  Does not appear to be infectious  Unknown etiology, unchanged from prior  Abx as per above    # Immunosuppression [High risk medical decision making for drug therapy requiring intensive monitoring for toxicity]  On home pred 5 mg daily, MMF 500 BID, tac BID  trough goal 5-7  Tac level 8, decrease dose to 1 mg BID  Cont as per above    # Infectious Prophylaxis and Monitoring:   -CMV D+/R+, EBV D+/R+ - completed prophylaxis for all except PJP - can switch to atovaquone if tolerated for last week (end 02/04/2023)  - f/u PCR of both  - currently being treated for recurrent UTI (despite suppressive therapy) though fluid collection peri-transplant appears to be stable  - moderate risk for Hep B reactivation - LFTs, HBV DNA, HBsAg q 3 months    # Blood Pressure / Volume:  home regimen of coreg 25bid, chlorthalidone 25mg , hydralazine 50mg  bid, spironolactone 25mg  ,amlodipine 5mg  bid  Restarted on hydralazine 50 BID, chlorthalidone 25 mg daily, carvedilol 25 mg BID and amlodpine 10 mg daily, spironolactone restarted    #Elevated pulse pressure  Significant low DBP 30-40s, PP 150s  CT abd pelvis w/o con 7/6 with significant burden of atherosclerotic cal of abd aorta and branches  likely in the setting of ESRD causing stiff vasculature.     #Normocytic Anemia  At baseline 8-9   Iron panel 12/29/2022 normal iron, iron sat, low tibc  Recc EPO 10,000 subcutaneous once      OVERALL RECOMMENDATIONS  EPO 10,000 subcutaneous  Decrease tac to 1mg  BID  Restart spironolactone 25 , reassess BP tomorrow  Outpt urodynamic studies recc    **Transplant patients with an open wound require wound care with sterile water only. The patient should be counseled on this at the time of discharge if they have not already been doing this.**     Patient was seen and discussed with attending nephrologist Dr. Vickie Epley. Recommendations were communicated to the primary team. Nephrology will continue to follow.    Thank you for this  consult.   Please page the renal fellow on call with questions/concerns.    Ambrose Mantle, MD  Division of Nephrology and Hypertension  [facility]  01/31/2023      Transplant Background  Date of Transplant: 08/06/2022 (Kidney)  Type of Transplant: DCD  KDPI: 56%  Cold ischemic time: 877 minutes (14 hr 37 min)  Warm ischemic time: 32 minutes  cPRA: 0%  HLA match:   Blood type: Donor A1, Recipient A POS  ID: CMV D+/R+, EBV D+/R+  Native Kidney Disease: Diabetes              Native kidney biopsy: Diabetic nephropathy (done elsewhere)              Pre-transplant dialysis course: PD started July 2017, transitioned to HD October 2019 due to catheter dysfunction/inadequate dialysis  Prior Transplants: None  Induction: Basiliximab  Early steroid withdrawal: No     Biopsies:   None    ___________________________________________________________    Subjective/Interval History: No overnight events.  Patient has no complaints denies chest pain shortness of breath nausea vomiting diarrhea.  Denies abdominal pain.  No blood or black in stool no blood in urine.    Medications:   Current Facility-Administered Medications   Medication Dose Route Frequency Provider Last Rate Last Admin    acetaminophen (TYLENOL) tablet 1,000 mg  1,000 mg Oral Q8H PRN Trina Ao, MD   1,000 mg at 01/29/23 0542    aluminum-magnesium hydroxide-simethicone (MAALOX MAX) 80-80-8 mg/mL oral suspension  30 mL Oral Q4H PRN Bernita Raisin, MD        amlodipine (NORVASC) tablet 10 mg  10 mg Oral Daily Estell Harpin, MD   10 mg at 01/30/23 1152    atorvastatin (LIPITOR) tablet 10 mg  10 mg Oral Daily Bernita Raisin, MD   10 mg at 01/30/23 0849    carvedilol (COREG) tablet 25 mg  25 mg Oral BID Trina Ao, MD   25 mg at 01/30/23 2101    chlorthalidone (HYGROTON) tablet 25 mg  25 mg Oral Daily Estell Harpin, MD   25 mg at 01/30/23 1152    cholecalciferol (vitamin D3 25 mcg (1,000 units)) tablet 50 mcg  50 mcg Oral Daily Anastasio Champion, MD   50 mcg at 01/30/23 1318    dextrose (D10W) 10% bolus 125 mL  12.5 g Intravenous Q10 Min PRN Bernita Raisin, MD        ertapenem [facility]) 500 mg in sodium chloride (NS) 0.9 % 50 mL IVPB  500 mg Intravenous Q24H Barnett Abu, MD   Stopped at 01/31/23 0147    glucagon injection 1 mg  1 mg Intramuscular Once PRN Bernita Raisin, MD        glucose chewable tablet 16 g  16 g Oral Q10 Min PRN Bernita Raisin, MD        guaiFENesin (ROBITUSSIN) oral syrup  200 mg Oral Q4H PRN Bernita Raisin, MD heparin (porcine) 5,000 unit/mL injection 5,000 Units  5,000 Units Subcutaneous [facility] Myriam Jacobson Royer, DO   5,000 Units at 01/31/23 1610    hydrALAZINE (APRESOLINE) tablet 50 mg  50 mg Oral BID Trina Ao, MD   50 mg at 01/30/23 2101    insulin lispro (HumaLOG) injection 0-20 Units  0-20 Units Subcutaneous ACHS Bernita Raisin, MD   4 Units at 01/30/23 2102    levothyroxine (SYNTHROID) tablet 112 mcg  112 mcg Oral daily Trina Ao, MD   112 mcg at 01/31/23 [phone]  melatonin tablet 3 mg  3 mg Oral Nightly PRN Bernita Raisin, MD   3 mg at 01/30/23 2101    mycophenolate (CELLCEPT) capsule 250 mg  250 mg Oral BID Trina Ao, MD   250 mg at 01/30/23 2102    nystatin (MYCOSTATIN) oral suspension  500,000 Units Oral Q8H Park Breed, MD   500,000 Units at 01/31/23 0619    ondansetron (ZOFRAN-ODT) disintegrating tablet 4 mg  4 mg Oral Q6H PRN Trina Ao, MD        predniSONE (DELTASONE) tablet 5 mg  5 mg Oral Daily Myriam Jacobson Jae, DO   5 mg at 01/30/23 1610    senna (SENOKOT) tablet 2 tablet  2 tablet Oral Nightly PRN Bernita Raisin, MD        sulfamethoxazole-trimethoprim (BACTRIM) 400-80 mg tablet 80 mg of trimethoprim  1 tablet Oral Q MWF Barnett Abu, MD   80 mg of trimethoprim at 01/29/23 1200    tacrolimus (PROGRAF) 1.5mg  combo product  1.5 mg Oral BID Jaynie Collins, MD   1.5 mg at 01/30/23 2101        Physical Exam:  Vitals:    01/31/23 0600   BP:    Pulse: 63   Resp: 19   Temp:    SpO2: 95%     I/O this shift:  In: 62 [IV Piggyback:62]  Out: 1500 [Urine:1500]    Intake/Output Summary (Last 24 hours) at 01/31/2023 0644  Last data filed at 01/31/2023 0400  Gross per 24 hour   Intake 327 ml   Output 2210 ml   Net -1883 ml     General: well-appearing, NAD  HEENT: conjunctival anicteric  CV: RRR nl s1/s2 no s3/s4 3/6 systolic murmur, no JVD, no LE edema  Lungs: nl WOB, CTAB w/o adventitious sounds  Abd: soft, non-tender, non-distended w/o r/g  Skin: well healed L sided transplant scar  Psych: alert, engaged, appropriate mood and affect  Neuro: normal speech, no gross focal deficits     LABS/IMAGING: Lab and imaging results from last 24 hrs noted and reviewed in EMR.

## 2023-02-01 LAB — TACROLIMUS LEVEL, TROUGH: TACROLIMUS, TROUGH: 6.8 ng/mL (ref 5.0–15.0)

## 2023-02-01 LAB — BASIC METABOLIC PANEL
ANION GAP: 4 mmol/L — ABNORMAL LOW (ref 5–14)
ANION GAP: 8 mmol/L (ref 5–14)
BLOOD UREA NITROGEN: 29 mg/dL — ABNORMAL HIGH (ref 9–23)
BLOOD UREA NITROGEN: 25 mg/dL — ABNORMAL HIGH (ref 9–23)
BUN / CREAT RATIO: 18
BUN / CREAT RATIO: 17
CALCIUM: 8.7 mg/dL (ref 8.7–10.4)
CALCIUM: 8.5 mg/dL — ABNORMAL LOW (ref 8.7–10.4)
CHLORIDE: 113 mmol/L — ABNORMAL HIGH (ref 98–107)
CHLORIDE: 109 mmol/L — ABNORMAL HIGH (ref 98–107)
CO2: 22 mmol/L (ref 20.0–31.0)
CO2: 21 mmol/L (ref 20.0–31.0)
CREATININE: 1.59 mg/dL — ABNORMAL HIGH
CREATININE: 1.46 mg/dL — ABNORMAL HIGH
EGFR CKD-EPI (2021) FEMALE: 34 mL/min/{1.73_m2} — ABNORMAL LOW (ref >=60)
EGFR CKD-EPI (2021) FEMALE: 38 mL/min/{1.73_m2} — ABNORMAL LOW (ref >=60)
GLUCOSE RANDOM: 130 mg/dL (ref 70–179)
GLUCOSE RANDOM: 175 mg/dL (ref 70–179)
POTASSIUM: 5 mmol/L — ABNORMAL HIGH (ref 3.4–4.8)
POTASSIUM: 5.3 mmol/L — ABNORMAL HIGH (ref 3.4–4.8)
SODIUM: 139 mmol/L (ref 135–145)
SODIUM: 138 mmol/L (ref 135–145)

## 2023-02-01 LAB — CBC
HEMATOCRIT: 22.7 % — ABNORMAL LOW (ref 34.0–44.0)
HEMOGLOBIN: 7.9 g/dL — ABNORMAL LOW (ref 11.3–14.9)
MEAN CORPUSCULAR HEMOGLOBIN CONC: 34.7 g/dL (ref 32.0–36.0)
MEAN CORPUSCULAR HEMOGLOBIN: 32.2 pg (ref 25.9–32.4)
MEAN CORPUSCULAR VOLUME: 92.7 fL (ref 77.6–95.7)
MEAN PLATELET VOLUME: 8.5 fL (ref 6.8–10.7)
PLATELET COUNT: 89 10*9/L — ABNORMAL LOW (ref 150–450)
RED BLOOD CELL COUNT: 2.45 10*12/L — ABNORMAL LOW (ref 3.95–5.13)
RED CELL DISTRIBUTION WIDTH: 13.2 % (ref 12.2–15.2)
WBC ADJUSTED: 3.5 10*9/L — ABNORMAL LOW (ref 3.6–11.2)

## 2023-02-01 LAB — MAGNESIUM: MAGNESIUM: 1.7 mg/dL (ref 1.6–2.6)

## 2023-02-01 LAB — ALBUMIN: ALBUMIN: 2.8 g/dL — ABNORMAL LOW (ref 3.4–5.0)

## 2023-02-01 MED ADMIN — tacrolimus (PROGRAF) capsule 1 mg: 1 mg | ORAL | @ 12:00:00

## 2023-02-01 MED ADMIN — hydrALAZINE (APRESOLINE) tablet 50 mg: 50 mg | ORAL | @ 14:00:00 | Stop: 2023-02-01

## 2023-02-01 MED ADMIN — nystatin (MYCOSTATIN) oral suspension: 500000 [IU] | ORAL | @ 18:00:00 | Stop: 2023-02-04

## 2023-02-01 MED ADMIN — heparin (porcine) 5,000 unit/mL injection 5,000 Units: 5000 [IU] | SUBCUTANEOUS | @ 18:00:00

## 2023-02-01 MED ADMIN — predniSONE (DELTASONE) tablet 5 mg: 5 mg | ORAL | @ 14:00:00

## 2023-02-01 MED ADMIN — chlorthalidone (HYGROTON) tablet 25 mg: 25 mg | ORAL | @ 14:00:00

## 2023-02-01 MED ADMIN — ertapenem (INVANZ) 1 g in sodium chloride 0.9 % (NS) 100 mL IVPB-MBP: 1 g | INTRAVENOUS | @ 14:00:00 | Stop: 2023-02-01

## 2023-02-01 MED ADMIN — levothyroxine (SYNTHROID) tablet 112 mcg: 112 ug | ORAL | @ 10:00:00

## 2023-02-01 MED ADMIN — heparin (porcine) 5,000 unit/mL injection 5,000 Units: 5000 [IU] | SUBCUTANEOUS | @ 03:00:00

## 2023-02-01 MED ADMIN — tacrolimus (PROGRAF) capsule 1 mg: 1 mg | ORAL

## 2023-02-01 MED ADMIN — hydrALAZINE (APRESOLINE) tablet 50 mg: 50 mg | ORAL | @ 18:00:00

## 2023-02-01 MED ADMIN — hydrALAZINE (APRESOLINE) tablet 50 mg: 50 mg | ORAL

## 2023-02-01 MED ADMIN — insulin glargine (LANTUS) injection 3 Units: 3 [IU] | SUBCUTANEOUS | @ 01:00:00

## 2023-02-01 MED ADMIN — nystatin (MYCOSTATIN) oral suspension: 500000 [IU] | ORAL | @ 12:00:00 | Stop: 2023-02-04

## 2023-02-01 MED ADMIN — amlodipine (NORVASC) tablet 10 mg: 10 mg | ORAL | @ 14:00:00

## 2023-02-01 MED ADMIN — sodium zirconium cyclosilicate (LOKELMA) packet 10 g: 10 g | ORAL | @ 18:00:00 | Stop: 2023-02-01

## 2023-02-01 MED ADMIN — mycophenolate (CELLCEPT) capsule 500 mg: 500 mg | ORAL | @ 01:00:00

## 2023-02-01 MED ADMIN — carvedilol (COREG) tablet 25 mg: 25 mg | ORAL | @ 14:00:00

## 2023-02-01 MED ADMIN — insulin lispro (HumaLOG) injection 0-20 Units: 0-20 [IU] | SUBCUTANEOUS | @ 21:00:00

## 2023-02-01 MED ADMIN — heparin (porcine) 5,000 unit/mL injection 5,000 Units: 5000 [IU] | SUBCUTANEOUS | @ 10:00:00

## 2023-02-01 MED ADMIN — cholecalciferol (vitamin D3 25 mcg (1,000 units)) tablet 50 mcg: 50 ug | ORAL | @ 14:00:00

## 2023-02-01 MED ADMIN — carvedilol (COREG) tablet 25 mg: 25 mg | ORAL

## 2023-02-01 MED ADMIN — atorvastatin (LIPITOR) tablet 10 mg: 10 mg | ORAL | @ 14:00:00

## 2023-02-01 MED ADMIN — mycophenolate (CELLCEPT) capsule 500 mg: 500 mg | ORAL | @ 12:00:00

## 2023-02-01 MED ADMIN — nystatin (MYCOSTATIN) oral suspension: 500000 [IU] | ORAL | @ 03:00:00 | Stop: 2023-02-04

## 2023-02-01 MED ADMIN — lactated ringers bolus 1,000 mL: 1000 mL | INTRAVENOUS | @ 15:00:00 | Stop: 2023-02-01

## 2023-02-01 NOTE — Unmapped (Signed)
IMMUNOCOMPROMISED HOST INFECTIOUS DISEASE PROGRESS NOTE    Assessment/Plan:     Julie [name] is a 72 y.o. 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 Had perinephric drain in place postoperatively, removed 08/30/22  - Immunosuppression: Tacrolimus (goal 5-7), MMF 500 BID (reduced 250mg  bid on 7/6) Pred 5  - Prophylaxis: valganciclovir x 3 months (mod risk, completed), TMP+SMX x 6 months  - baseline Cr ~1.1  - AKI 01/27/23 Estimated Creatinine Clearance: 24.7 mL/min (A) (based on SCr of 1.46 mg/dL (H)).     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 Dubliensis 08/05/22, <10K, negative recipient samples, negative donor blood cx, treated with 2 weeks PO fluconazole  # Positive Hep B sAg and core antibodies, c/w resolved infection, with moderate risk for reactivation 02/2021  - 12/28/22 HBV DNA not detected and HBsAg pending  # Recurrent MDR E.Coli Bacteremia/UTI  - 1/29 BCx/UCx E. Coli - s/p Levo x 3 weeks (thru stent removal, 3/15) (S: Cefazolin, Cipro/Levo, Pip-tazo, Cephalexin; R: TMP-SMX, Amp, Amp-sulb)  - 3/14 BCx/UCx ESBL/FQ-R E. Coli (S: Erta, Gent, Mero, Tobra, Cefepime SDD; I: ceftaz; R: Cipro/Levo, Cefazolin, Ceftriaxone, Amp) s/p 14 days Meropenem   - 4/13 Bcx/Ucx E. Coli (R CFZ, CTX, cipro/levo, I ceftaz, SDD CFP, susc nitro) s/p mero x 2 weeks  - 5/7 seen by urogyn, no structural issues planned to stop nitrofurantoin with switch to methenamine (not done), added vaginal estrogen/d-mannose.  - 6/3 UCx <10,000 E. Coli (S: Cefepime SDD; Erta/Mero, Gent, Nitrofurantoin, Tobra) s/p ertapenem until 01/15/23  - 4/29 started nitrofurantoin prophylaxis; not restarted after 6/24 due to reduced GFR and breakthrough in 12/2022     Active infection:  #Perinephric fluid collection 10/05/22, slowly improving 01/27/23  - 3/14 BCx/UCx ESBL/FQ-R E. Coli (S: Erta, Gent, Mero, Tobra, Cefepime SDD; I: ceftaz; R: Cipro/Levo, Cefazolin, Ceftriaxone, Amp) s/p 14 days Meropenem   - 3/19 IR guided drainage negative cx, Cr 1.5 not suggestive of urinoma  - 4/13 increased stranding, 4/16 not amenable to intervention per VIR  - 01/27/23 CT a/p 7.4 x 1.0 x 5.4 cm crescentic fluid collection within the soft tissues lateral to the transplant kidney (8.3 x 0.9 x 5.5 cm on 11/05/22)  - 7/10 VIR: collection not large enough to aspirate     # Fever and septic shock, presumed urinary source given pyuria and recurrent UTI history 01/27/23  - 7/6 3x emesis in the ED with sudden acute change in mental status, prompting intubation for airway protection --> extubated 7/7  - 7/6 flu/RSV/covid neg; UCx <1,000  CFU (appears on plates as E. Coli); BCx (NGTD)  - 7/6 CXR more likely edema or aspiration then pneumonia  - 7/6 CT A/P: Bibasilar consolidation. No hydronephrosis. No solid renal mass. Atrophic bilateral native kidneys. Similar appearance of nonobstructive punctate calculi and vascular calcifications. Subcentimeter hypoattenuating lesions within the renal transplant, too small to characterize. No hydronephrosis. Similar inflammatory stranding surrounding the transplant kidney, particularly around the hilum. 7.4 x 1.0 x 5.4 cm crescentic fluid collection within the soft tissues lateral to the transplant kidney (8.3 x 0.9 x 5.5 cm on 11/05/22)  - 7/7 renal transplant [place]: The renal transplant was located in the left lower quadrant. Normal size and echogenicity. 2 subcentimeter anechoic cysts are present. There is pelvic caliectasis.   Rx 7/6 vanc/cefepime -> 7/7 Vanc, mero--> 7/8 meropenem--> 7/9 Ertapenem        Antimicrobial allergy/intolerance:   Penicillin (see allergy list for nuances)  Valacyclovir - nausea         RECOMMENDATIONS    Diagnosis  F/u 7/6 BCx (NGTD)  Tunneled line planned for 7/15-7/17    Management  Continue Ertapenem IV (renally dose)  Duration: minimum 6-8 weeks  Unclear if pt is having recurrent UTIs or a persistent reservoir based on fluid and stranding around transplanted kidney, that may require long-term IV abx if we cannot find an appropriate oral prophylaxis agent    Antimicrobial prophylaxis required for transplant immunosuppression   Continue tmp-smx  (END: 02/04/23)  Resolved HBV: obtain LFTs monthly and HBV DNA and SAg for any rise in LFTs    Intensive toxicity monitoring for prescription antimicrobials   CBC w/diff at least once per week  CMP at least once per week  clinical assessments for rashes or other skin changes    Solid Organ Transplant Infectious Diseases Follow-up Instructions  Appointment: PENDING  Location: [facility], [address] [place], [place]  Labs: weekly CBC with differential, CMP  Please fax labs to patient???s transplant coordinator: Celene Squibb at  [phone] (Kidney/Pancreas) and to Michela Pitcher (ICHID pharmD at [phone])   Antibiotics:   Ertapenem IV  Antibiotic End Date: TBD      The [facility] ID APP service will continue to follow.           Please page Darolyn Rua, NP at [phone] from 8-4:30pm, after 4:30 pm & on weekends, please page the ID Transplant/Liquid Oncology Fellow consult at [phone] with questions.    Patient discussed with Dr. Estevan Ryder.      Darolyn Rua, MSN, APRN, AGNP-C  Immunocompromised Infectious Disease Nurse Practitioner    I personally spent 20 minutes face-to-face and non-face-to-face in the care of this patient, which includes all pre, intra, and post visit time on the date of service.  All documented time was specific to the E/M visit and does not include any procedures that may have been performed.      Subjective:     External record(s): Primary team note: reviewed and noted plan to hold spironolactone due to elevated potassium .    Independent historian(s): no independent historian required.       Interval History:   Afebrile and NAEON. Patient continues to deny dysuria, hematuria, urinary retention, frequency/urgency, pain.     Medications:  Current Medications as of 02/01/2023  Scheduled  PRN   alteplase, 1 mg, Once  amlodipine, 10 mg, Daily  atorvastatin, 10 mg, Daily  carvedilol, 25 mg, BID  chlorthalidone, 25 mg, Daily  cholecalciferol (vitamin D3-10 mcg (400 unit)),  50 mcg, Daily  ertapenem, 1 g, Daily  heparin (porcine) for subcutaneous use, 5,000 Units, Q8H SCH  heparin, porcine (PF), 200 Units, Q MWF  hydrALAZINE, 50 mg, Q8H SCH  insulin glargine, 3 Units, Nightly  insulin lispro, 0-20 Units, ACHS  lactated ringers, 1,000 mL, Once  levothyroxine, 112 mcg, daily  mycophenolate, 500 mg, BID  nystatin, 500,000 Units, Q8H  predniSONE, 5 mg, Daily  sulfamethoxazole-trimethoprim, 1 tablet, Q MWF  tacrolimus, 1 mg, BID      acetaminophen, 1,000 mg, Q8H PRN  alteplase, 2 mg, Once PRN  aluminum-magnesium hydroxide-simethicone, 30 mL, Q4H PRN  dextrose in water, 12.5 g, Q10 Min PRN  glucagon, 1 mg, Once PRN  glucose, 16 g, Q10 Min PRN  guaiFENesin, 200 mg, Q4H PRN  melatonin, 3 mg, Nightly PRN  ondansetron, 4 mg, Q6H PRN  senna, 2 tablet, Nightly PRN         Objective:     Vital Signs last 24 hours:  Temp:  [36.5 ??C (97.7 ??F)-37.2 ??C (99 ??F)] 36.5 ??C (97.7 ??F)  Heart Rate: [61-68] 61  SpO2 Pulse:  [61-68] 61  Resp:  [17-25] 19  BP: (138-194)/(28-43) 160/28  MAP (mmHg):  [73-100] 80  SpO2:  [95 %-100 %] 95 %    Physical Exam:   Patient Lines/Drains/Airways Status       Active Active Lines, Drains, & Airways       Name Placement date Placement time Site Days    CVC Triple Lumen 01/28/23 Non-tunneled Left Internal jugular 01/28/23  0016  Internal jugular  4    Arteriovenous Fistula - Vein Graft  Access 08/24/16 Left;Upper Arm 08/24/16  --  Arm  2352                  Const [x]  vital signs above    []  NAD, non-toxic appearance []  Chronically ill-appearing, non-distressed  Sitting in chair; family at bedside      Eyes [x]  Lids normal bilaterally, conjunctiva anicteric and noninjected OU     [] PERRL  [] EOMI        ENMT [x]  Normal appearance of external nose and ears, no nasal discharge        [x]  MMM, no lesions on lips or gums [x]  No thrush, leukoplakia, oral lesions  []  Dentition good []  Edentulous []  Dental caries present  []  Hearing normal  []  TMs with good light reflexes bilaterally         Neck [x]  Neck of normal appearance and trachea midline        []  No thyromegaly, nodules, or tenderness   []  Full neck ROM        Lymph []  No LAD in neck     []  No LAD in supraclavicular area     []  No LAD in axillae   []  No LAD in epitrochlear chains     []  No LAD in inguinal areas        CV [x]  RRR            []  No peripheral edema     []  Pedal pulses intact   []  No abnormal heart sounds appreciated   [x]  Extremities WWP   Murmur present      Resp [x]  Normal WOB at rest    []  No breathlessness with speaking, no coughing  [x]  CTA anteriorly    [x]  CTA posteriorly          GI [x]  Normal inspection, NTND   []  NABS     []   No umbilical hernia on exam       []  No hepatosplenomegaly     []  Inspection of perineal and perianal areas normal        GU []  Normal external genitalia     [] No urinary catheter present in urethra   []  No CVA tenderness    []  No tenderness over renal allograft        MSK []  No clubbing or cyanosis of hands       []  No vertebral point tenderness  []  No focal tenderness or abnormalities on palpation of joints in RUE, LUE, RLE, or LLE        Skin [x]  No rashes, lesions, or ulcers of visualized skin     []  Skin warm and dry to palpation   LIJ with dressing c/d/i      Neuro [x]  Face expression symmetric  []  Sensation to light touch grossly intact throughout    []  Moves extremities equally    []  No tremor noted        []  CNs II-XII grossly intact     []  DTRs normal and symmetric throughout []  Gait unremarkable        Psych [x]  Appropriate affect       []  Fluent speech         [x]  Attentive, good eye contact  [x]  Oriented to person, place, time          []  Judgment and insight are appropriate           Data for Medical Decision Making     01/27/23 EKG QTcF    I discussed mgm't w/qualified health care professional(s) involved in case: discussed with pharmacy patient's ertapenem dose .    I reviewed CBC results (WBC <4 with improvement in platelets), chemistry results (creatinine elevated from yesterday), and therapeutic drug levels (tacrolimus remains within goal).    I independently visualized/interpreted not done.       Recent Labs     Units 01/27/23  1556 01/27/23  2004 01/28/23  0108 01/28/23  0422 02/01/23  0438 02/01/23  1128   WBC 10*9/L 11.5*  --  9.6   < > 3.5*  --    HGB g/dL 16.1*  --  8.3*   < > 7.9*  --    PLT 10*9/L 78*  --  60*   < > 89*  --    NEUTROABS 10*9/L 10.5*  --   --   --   --   --    LYMPHSABS 10*9/L 0.4*  --   --   --   --   --    EOSABS 10*9/L 0.0  --   --   --   --   --    BUN mg/dL 44*   < > 43*   < > 29* 25*   CREATININE mg/dL 0.96*   < > 0.45*   < > 1.59* 1.46*   AST U/L 12  --  11  --   --   --    ALT U/L 11  --  8*  --   --   --    BILITOT mg/dL 0.6  --  0.4  --   --   --    ALKPHOS U/L 57  --  43*  --   --   --    K mmol/L 4.1   < > 3.8 - 3.7   < > 5.0* 5.3*   MG mg/dL  --   --   --    < >  1.7  --    CALCIUM mg/dL 9.5   < > 8.2*   < > 8.7 8.5*    < > = values in this interval not displayed.     Lab Results   Component Value Date    TACROLIMUS 6.8 02/01/2023    ALBUMIN 2.8 (L) 02/01/2023       Microbiology:  Microbiology Results (last day)       Procedure Component Value Date/Time Date/Time    Blood Culture [[id number]]  (Normal) Collected: 01/27/23 1749    Lab Status: Preliminary result Specimen: Blood from 1 Peripheral Draw Updated: 01/31/23 1815     Blood Culture, Routine No Growth at 4 days    Blood Culture [[id number]]  (Normal) Collected: 01/27/23 1741    Lab Status: Preliminary result Specimen: Blood from 1 Peripheral Draw Updated: 01/31/23 1815     Blood Culture, Routine No Growth at 4 days               Imaging:  No new imaging

## 2023-02-01 NOTE — Unmapped (Signed)
Transplant Nephrology Follow-Up Consult note      Assessment/Recommendations: Julie [name]  is a 72 y.o. 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     # Status post kidney transplant, Allograft Function (unstable):   DDKT 08/06/2022 2/2 to DM  KDPI: 56% CPRA 0  Follows with Dr. Elvera Maria   Baseline cr 0.9-1.1  Peaked at 1.97, down trending, improving, back to baseline  Adequate UO  Mild bump in cr today, recc 1L fluids, likely hypovolemic while on spironolactone     #Fever/Septic shock  In the setting of recurrent UTI  ESBL ecoli bacteremia on prior admission  BC/UC negative on this admission, however elevated WBC on UA, bac few  EBV/CMV negative  Originally treated with vanc cefepime, now transition to meropenem due to ongoing febrile illness?  ID following  Foley removed 7/7  PVR around 230 cc, outpatient urodynamic studies recc    #Perinephric fluid collection  CT abd pelvis w/o con . Similar size of the 7.4 x 1.0 x 5.4 cm crescentic fluid collection within the soft tissues lateral to the transplant kidney, previously 8.3 x 0.9 x 5.5 cm (2:84, 4:30). There is increased inflammatory changes and thickening involving the adjacent oblique and transversus abdominis musculature, similar to prior. Thickening of the adjacent peritoneum also appears similar to prior.   drained 10/10/2022 with no growth on culture  Does not appear to be infectious  Per ID likely transplanted kidney is source  Planned for R tunneled cath with 6 weeks or ertapenem    # Immunosuppression [High risk medical decision making for drug therapy requiring intensive monitoring for toxicity]  On home pred 5 mg daily, MMF 500 BID, tac BID  trough goal 5-7  Reassess tac dosing daily  Cont as per above    # Infectious Prophylaxis and Monitoring:   -CMV D+/R+, EBV D+/R+ - completed prophylaxis for all except PJP - can switch to atovaquone if tolerated for last week (end 02/04/2023)  - f/u PCR of both  - currently being treated for recurrent UTI (despite suppressive therapy) though fluid collection peri-transplant appears to be stable  - moderate risk for Hep B reactivation - LFTs, HBV DNA, HBsAg q 3 months    # Blood Pressure / Volume:  home regimen of coreg 25bid, chlorthalidone 25mg , hydralazine 50mg  bid, spironolactone 25mg  ,amlodipine 5mg  bid  Restarted on hydralazine 50 BID, chlorthalidone 25 mg daily, carvedilol 25 mg BID and amlodpine 10 mg daily, spironolactone restarted    #Elevated pulse pressure  Significant low DBP 30-40s, PP 150s  CT abd pelvis w/o con 7/6 with significant burden of atherosclerotic cal of abd aorta and branches  likely in the setting of ESRD causing stiff vasculature.     #Normocytic Anemia  At baseline 8-9   Iron panel 12/29/2022 normal iron, iron sat, low tibc  Received EPO 10k 7/10      OVERALL RECOMMENDATIONS  Hold spironolactone  Give 1 L fluids  For elevated BP increase hydralazine to 50 mg Q8, or 75 mg Q8    **Transplant patients with an open wound require wound care with sterile water only. The patient should be counseled on this at the time of discharge if they have not already been doing this.**     Patient was seen and discussed with attending nephrologist  Dr. Vickie Epley. Recommendations were communicated to the primary team. Nephrology will continue to follow.    Thank you for this consult.   Please page the renal fellow on call with questions/concerns.    Ambrose Mantle, MD  Division of Nephrology and Hypertension  [facility]  02/01/2023      Transplant Background  Date of Transplant: 08/06/2022 (Kidney)  Type of Transplant: DCD  KDPI: 56%  Cold ischemic time: 877 minutes (14 hr 37 min)  Warm ischemic time: 32 minutes  cPRA: 0%  HLA match:   Blood type: Donor A1, Recipient A POS  ID: CMV D+/R+, EBV D+/R+  Native Kidney Disease: Diabetes              Native kidney biopsy: Diabetic nephropathy (done elsewhere)              Pre-transplant dialysis course: PD started July 2017, transitioned to HD October 2019 due to catheter dysfunction/inadequate dialysis  Prior Transplants: None  Induction: Basiliximab  Early steroid withdrawal: No     Biopsies:   None    ___________________________________________________________    Subjective/Interval History: No overnight events.  Patient has no complaints denies chest pain shortness of breath nausea vomiting diarrhea.  Denies abdominal pain.  No blood or black in stool no blood in urine.    Medications:   Current Facility-Administered Medications   Medication Dose Route Frequency Provider Last Rate Last Admin    acetaminophen (TYLENOL) tablet 1,000 mg  1,000 mg Oral Q8H PRN Trina Ao, MD   1,000 mg at 01/29/23 0542    aluminum-magnesium hydroxide-simethicone (MAALOX MAX) 80-80-8 mg/mL oral suspension  30 mL Oral Q4H PRN Bernita Raisin, MD        amlodipine (NORVASC) tablet 10 mg  10 mg Oral Daily Estell Harpin, MD   10 mg at 01/31/23 0849    atorvastatin (LIPITOR) tablet 10 mg  10 mg Oral Daily Bernita Raisin, MD   10 mg at 01/31/23 0849    carvedilol (COREG) tablet 25 mg  25 mg Oral BID Trina Ao, MD   25 mg at 01/31/23 2029    chlorthalidone (HYGROTON) tablet 25 mg  25 mg Oral Daily Estell Harpin, MD   25 mg at 01/31/23 0848    cholecalciferol (vitamin D3 25 mcg (1,000 units)) tablet 50 mcg  50 mcg Oral Daily Anastasio Champion, MD   50 mcg at 01/31/23 0848    dextrose (D10W) 10% bolus 125 mL  12.5 g Intravenous Q10 Min PRN Bernita Raisin, MD        ertapenem [facility]) 1 g in sodium chloride 0.9 % (NS) 100 mL IVPB-MBP  1 g Intravenous Daily Choi, Hee Jae, DO        glucagon injection 1 mg  1 mg Intramuscular Once PRN Bernita Raisin, MD        glucose chewable tablet 16 g  16 g Oral Q10 Min PRN Bernita Raisin, MD        guaiFENesin (ROBITUSSIN) oral syrup  200 mg Oral Q4H PRN Bernita Raisin, MD        heparin (porcine) 5,000 unit/mL injection 5,000 Units  5,000 Units Subcutaneous [facility] Myriam Jacobson [place], DO   5,000 Units at 02/01/23 1610    heparin, porcine (PF) 100 unit/mL injection 200 Units  200 Units Intravenous Q MWF Jake Bathe, MD        hydrALAZINE (APRESOLINE) tablet 50 mg  50 mg Oral BID Trina Ao, MD   50 mg  at 01/31/23 2029    insulin glargine (LANTUS) injection 3 Units  3 Units Subcutaneous Nightly Estell Harpin, MD   3 Units at 01/31/23 2030    insulin lispro (HumaLOG) injection 0-20 Units  0-20 Units Subcutaneous ACHS Bernita Raisin, MD   4 Units at 01/31/23 1629    levothyroxine (SYNTHROID) tablet 112 mcg  112 mcg Oral daily Trina Ao, MD   112 mcg at 02/01/23 1610    melatonin tablet 3 mg  3 mg Oral Nightly PRN Bernita Raisin, MD   3 mg at 01/30/23 2101    mycophenolate (CELLCEPT) capsule 500 mg  500 mg Oral BID Barnett Abu, MD   500 mg at 01/31/23 2030    nystatin (MYCOSTATIN) oral suspension  500,000 Units Oral Q8H Park Breed, MD   500,000 Units at 01/31/23 2257    ondansetron (ZOFRAN-ODT) disintegrating tablet 4 mg  4 mg Oral Q6H PRN Trina Ao, MD        predniSONE (DELTASONE) tablet 5 mg  5 mg Oral Daily Myriam Jacobson Jae, DO   5 mg at 01/31/23 0848    senna (SENOKOT) tablet 2 tablet  2 tablet Oral Nightly PRN Bernita Raisin, MD        [Provider Hold] spironolactone (ALDACTONE) tablet 25 mg  25 mg Oral Daily Barnett Abu, MD   25 mg at 01/31/23 1136    [Provider Hold] sulfamethoxazole-trimethoprim (BACTRIM) 400-80 mg tablet 80 mg of trimethoprim  1 tablet Oral Q MWF Barnett Abu, MD   80 mg of trimethoprim at 01/31/23 0849    tacrolimus (PROGRAF) capsule 1 mg  1 mg Oral BID Barnett Abu, MD   1 mg at 01/31/23 2029        Physical Exam:  Vitals:    02/01/23 0500   BP: 185/41   Pulse: 67   Resp: 21   Temp:    SpO2: 98%     No intake/output data recorded.    Intake/Output Summary (Last 24 hours) at 02/01/2023 1658  Last data filed at 02/01/2023 1600  Gross per 24 hour   Intake 1340 ml   Output 2100 ml   Net -760 ml     General: well-appearing, NAD  HEENT: conjunctival anicteric  CV: RRR nl s1/s2 no s3/s4 3/6 systolic murmur, no JVD, no LE edema  Lungs: nl WOB, CTAB w/o adventitious sounds  Abd: soft, non-tender, non-distended w/o r/g  Skin: well healed L sided transplant scar  Psych: alert, engaged, appropriate mood and affect  Neuro: normal speech, no gross focal deficits     LABS/IMAGING: Lab and imaging results from last 24 hrs noted and reviewed in EMR.

## 2023-02-01 NOTE — Unmapped (Signed)
Aox4; denies pain. BP stable; NSR; Afebrile. RA. Voided to BSC. 0 BM. 1 L LR given this shift. Ambulated in halls with PT this shift. See [facility] & flowsheets for more.      Problem: Adult Inpatient Plan of Care  Goal: Plan of Care Review  Outcome: Ongoing - Unchanged  Goal: Patient-Specific Goal (Individualized)  Outcome: Ongoing - Unchanged  Goal: Absence of Hospital-Acquired Illness or Injury  Outcome: Ongoing - Unchanged  Intervention: Identify and Manage Fall Risk  Recent Flowsheet Documentation  Taken 02/01/2023 0800 by Ardith Dark, Barbie Haggis, RN  Safety Interventions:   aspiration precautions   bleeding precautions   fall reduction program maintained   low bed   infection management  Intervention: Prevent Skin Injury  Recent Flowsheet Documentation  Taken 02/01/2023 1600 by Ardith Dark, Barbie Haggis, RN  Positioning for Skin: Sitting in Chair  Taken 02/01/2023 1400 by Ardith Dark, Barbie Haggis, RN  Positioning for Skin: Sitting in Chair  Taken 02/01/2023 1200 by Ardith Dark, Barbie Haggis, RN  Positioning for Skin: Sitting in Chair  Taken 02/01/2023 1000 by Ardith Dark, Barbie Haggis, RN  Positioning for Skin: Sitting in Chair  Taken 02/01/2023 0800 by Ardith Dark, Barbie Haggis, RN  Positioning for Skin: Sitting in Chair  Device Skin Pressure Protection:   absorbent pad utilized/changed   adhesive use limited  Skin Protection:   adhesive use limited   cleansing with dimethicone incontinence wipes   incontinence pads utilized  Intervention: Prevent Infection  Recent Flowsheet Documentation  Taken 02/01/2023 0800 by Ardith Dark, Barbie Haggis, RN  Infection Prevention: hand hygiene promoted  Goal: Optimal Comfort and Wellbeing  Outcome: Ongoing - Unchanged  Goal: Readiness for Transition of Care  Outcome: Ongoing - Unchanged  Goal: Rounds/Family Conference  Outcome: Ongoing - Unchanged     Problem: Fall Injury Risk  Goal: Absence of Fall and Fall-Related Injury  Outcome: Ongoing - Unchanged  Intervention: Promote Injury-Free Environment  Recent Flowsheet Documentation  Taken 02/01/2023 0800 by Ardith Dark, Barbie Haggis, RN  Safety Interventions:   aspiration precautions   bleeding precautions   fall reduction program maintained   low bed   infection management     Problem: Self-Care Deficit  Goal: Improved Ability to Complete Activities of Daily Living  Outcome: Ongoing - Unchanged     Problem: Non-Violent Restraints  Goal: Patient will remain free of restraint events  Outcome: Ongoing - Unchanged  Goal: Patient will remain free of physical injury  Outcome: Ongoing - Unchanged     Problem: Mechanical Ventilation Invasive  Goal: Effective Communication  Outcome: Ongoing - Unchanged  Goal: Optimal Device Function  Outcome: Ongoing - Unchanged  Goal: Mechanical Ventilation Liberation  Outcome: Ongoing - Unchanged  Goal: Optimal Nutrition Delivery  Outcome: Ongoing - Unchanged  Goal: Absence of Device-Related Skin and Tissue Injury  Outcome: Ongoing - Unchanged  Intervention: Maintain Skin and Tissue Health  Recent Flowsheet Documentation  Taken 02/01/2023 0800 by Ardith Dark, Barbie Haggis, RN  Device Skin Pressure Protection:   absorbent pad utilized/changed   adhesive use limited  Goal: Absence of Ventilator-Induced Lung Injury  Outcome: Ongoing - Unchanged  Intervention: Prevent Ventilator-Associated Pneumonia  Recent Flowsheet Documentation  Taken 02/01/2023 1600 by Ardith Dark, Barbie Haggis, RN  Head of Bed [facility]) Positioning: HOB at 60-90 degrees  Taken 02/01/2023 1200 by Ardith Dark, Barbie Haggis, RN  Head of Bed [facility]) Positioning: HOB at 60-90 degrees  Taken 02/01/2023 0800 by Ardith Dark, Barbie Haggis, RN  Head  of Bed (HOB) Positioning: HOB at 30-45 degrees     Problem: Skin Injury Risk Increased  Goal: Skin Health and Integrity  Outcome: Ongoing - Unchanged  Intervention: Optimize Skin Protection  Recent Flowsheet Documentation  Taken 02/01/2023 1600 by Ardith Dark, Barbie Haggis, RN  Head of Bed [facility]) Positioning: HOB at 60-90 degrees  Taken 02/01/2023 1400 by Ardith Dark, Barbie Haggis, RN  Activity Management: ambulated outside room  Taken 02/01/2023 1200 by Ardith Dark, Barbie Haggis, RN  Head of Bed [facility]) Positioning: HOB at 60-90 degrees  Taken 02/01/2023 0800 by Ardith Dark, Barbie Haggis, RN  Activity Management: up in chair  Pressure Reduction Techniques: frequent weight shift encouraged  Head of Bed (HOB) Positioning: HOB at 30-45 degrees  Pressure Reduction Devices: pressure-redistributing mattress utilized  Skin Protection:   adhesive use limited   cleansing with dimethicone incontinence wipes   incontinence pads utilized

## 2023-02-01 NOTE — Unmapped (Signed)
MICU Daily Progress Note     Date of Service: 02/01/2023    Problem List:  Principal Problem:    Sepsis with encephalopathy without septic shock (CMS-HCC)  Active Problems:    Type 2 diabetes mellitus with diabetic nephropathy, with long-term current use of insulin (CMS-HCC)    End-stage renal disease (CMS-HCC)    Kidney transplant recipient    Anemia due to stage 5 chronic kidney disease, not on chronic dialysis (CMS-HCC)    Immunosuppressed status (CMS-HCC)    Hypertension    Hyperlipidemia    Chronic idiopathic thrombocytopenia (CMS-HCC)    Acute metabolic encephalopathy    AKI (acute kidney injury) (CMS-HCC)    Acute metabolic acidosis  Resolved Problems:    * No resolved hospital problems. *    Interval history: Julie [name] is a 72 y.o. 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 C/f sepsis 2/2 UTI, on meropenem.      24hr events:   - On ertapenem now. Tunnel line planned for next week.  - Potassium this AM 5, held spironolactone  - Restarted home blood pressure medications  - Tunneled line planned for 7/15 - 7/17    Neurological   AMS - Acute metabolic encephalopathy w/ fever (improving)  Known history of AMS with fevers, see prior admission notes (Jan, Feb, March, Apr, June). Now weaned off sedation following extubation. A&Ox4 this AM. No new fevers.     Analgesia: No pain issues  RASS at goal? No - adjusting sedation or order to reflect need  [place] Agitation Assessment Scale (RASS) : 0 (02/01/2023  4:35 AM)      Pulmonary   S/p Intubation   Intubated in ED due to concerns of airway protection related to emesis x3 and sudden onset AMS. Extubated 7/7 to [place]. Most recently on 1L [place]. CXR 7/7 with mild bilateral hazy interstitial opacities c/f interstitial pulmonary edema.   - On room air Cardiovascular   Hypertension  Elevated BP since admission with MAP in 80s. Less concern for acute illness and permissible to resume antiHTN medications. Elevated pulse pressure likely secondary to atherosclerotic aorta/stiff vasculature. Persistently elevated blood pressure today 7/10.   - HOLD home spironolactone 25mg  daily  - RESUME home chlorthalidone 25mg  daily  - RESUME home carvedilol 25 mg daily  - RESUME home amlodipine 10mg  daily,  - RESUME home hydralazine 50mg  BID    - HLD: continue home atorvastatin 10 mg daily     Renal   S/p L DDKT 08/06/22 2/2 diabetic nephropathy   Follows with Dr. Elvera Maria. Lowest Cr ~0.95 after DDKT, presumed new baseline between 0.9-1.2. Cr 1.3 on 7/6 admission, now uptrended to 1.92 with minimal UOP. Transplant renal US 7/7 with dec peak systolic velocity of the main renal artery at the anastomosis with overall decreased resistive indices when compared to 12/25/2022. Thin, heterogeneous collection lateral to the transplant kidney, may reflect residua of the perinephric collection noted on prior studies. Urine sediment consistent with mild tubular injury and UTI. Most recently, Cr 1.59 up from 1.24 but maintaining appropriate UOP.   - Strict I/Os via Foley  - BMP, Phos, Mag  - Per transplant nephrology: Tacro 1.0 PO BID   - Tac trough MWF, goal 5-7   - Continue home prednisone 5mg  daily.   - Resume at home cellcept 500  mg BID  - HOLD alendronate 70mg  weekly while intubated  - Restarted Vit D daily   - 1L LR  - Transplant nephrology following    Infectious Disease/Autoimmune   Fever in IC host - Lactic Acidosis - C/f bacteremia   Presented febrile with WBC elevated at 11.5 and lactate to 3.1. Has had 5 admissions this year with similar presentation of AMS and fever found with ESBL E.coli bacteremia 2/2 MDR UTI, see above for further hx. Most recent course of ertapenem completed 01/15/23 for presumed graft pyelonephritis in absence of clear infectious source. Received vancomycin and cefepime in ED. Transitioned to IV meropenem based on prior susceptibilities on arrival to MICU. Pursued comprehensive infectious workup for IC patient with fever. CXR unremark, UA concerning for UTI. Ucx no significant growth. Bcx no growth at 24h. CTAP with stable to mildly decreased size of the crescentic fluid collection within the overlying soft tissues measuring up to 7.4 cm in size lateral to transplant kidney. Limited evaluation for abscess given absence of IV contrast.     ON febrile to 39.3, nontachycardic, and off pressors. WBC dec to 6.8, lactate normal. ICID consulted, continuing meropenem given prior UTI susceptibilities.   - Switch abx to IV ertapenem 1 mg q24h for minimum of 6-8 weeks  - VIR consulted for tunneled line for IV abx  - Cont nystatin SS 5ml tid  - EBV result neg  - Daily CBC/ diff  - ICID c/s    - Prophlyaxis:  - Resume TMP+SMX renally dosed equivalent 400/80 MWF x 6 months (end 02/04/2023)   - Has completed valcyte for 3 months  - Resolved HBV: obtain LFTs monthly and HBV DNA and SAg for any rise in LF     Cultures:  Blood Culture, Routine (no units)   Date Value   01/27/2023 No Growth at 4 days   01/27/2023 No Growth at 4 days     Urine Culture, Comprehensive (no units)   Date Value   01/27/2023 NO SIGNIFICANT GROWTH: <1000 CFU/mL     WBC   Date Value   02/01/2023 3.5 10*9/L (L)   01/22/2023 6.3   01/16/2023 3.4 x10E3/uL     WBC Clumps (/HPF)   Date Value   01/27/2023 Few (A)     WBC, UA (/HPF)   Date Value   01/27/2023 >182 (H)          FEN/GI   Chronic constipation  Home bowel regimen senna daily and colace 100mg  PRN.  - Continue daily senna  - Colace PRN    Nutrition:  - PO diet    Provider Malnutrition Assessment:  Body mass index is 23.42 kg/m??.           Heme/Coag   Acute on Chronic Thrombocytopenia  Known chronic thrombocytopenia ~110-130, presented with platelets of 73. Of note, patient has had similar drop in platelets during previous admissions for bacteremia with resolution back to baseline. Suspect drop is in response to acute illness. Plts stable at 60. Plt stable 55.  - F/up Blood smear  - Trend Plt on daily CBC     Anemia on CKD  Baseline Hgb ~10. Presenting Hgb 10.3. Iron panel and ferritin at prior June admission consistent with AoCD. If acute drop, will repeat anemia workup. Hgb dec to 8.3, most likely dilutional iso 4L LR. Hgb stable 8.1  - Trend Hgb on daily CBC    DVT prophylaxis  - subcutaneous Heparin 5000 units    Endocrine  T2DM, insulin-dependent  Most recent 12/07/22 A1C 5.8. BG in range.   - SSI  - Lantus 4U nightly    Hypothyroidism: Continue home synthroid 112 mcg daily    Integumentary   NAI    Prophylaxis/LDA/Restraints/Consults   ICU Checklist completed: yes (see ICU rounding navigator in Epic)    Patient Lines/Drains/Airways Status       Active Active Lines, Drains, & Airways       Name Placement date Placement time Site Days    CVC Triple Lumen 01/28/23 Non-tunneled Left Internal jugular 01/28/23  0016  Internal jugular  4    Arteriovenous Fistula - Vein Graft  Access 08/24/16 Left;Upper Arm 08/24/16  --  Arm  2352                  Patient Lines/Drains/Airways Status       Active Wounds       None                    Goals of Care     Code Status: Full Code    Designated Healthcare Decision Maker:  Ms. Broady's current decisional capacity for healthcare decision-making is Full capacity. Her designated Educational psychologist) is/are   HCDM (patient stated preference) (Active): Gervin,[name] - Spouse - [phone].      Subjective     Denies pain. A&Ox4. Mental status at baseline.       Objective     Vitals - past 24 hours  Temp:  [35.9 ??C (96.6 ??F)-36.8 ??C (98.2 ??F)] 36.7 ??C (98.1 ??F)  Heart Rate:  [63-69] 67  SpO2 Pulse:  [63-69] 64  Resp:  [17-25] 21  BP: (154-196)/(26-89) 185/41  SpO2:  [95 %-100 %] 98 % Intake/Output  I/O last 3 completed shifts:  In: 502 [P.O.:440; IV Piggyback:62]  Out: 3150 [Urine:3150]     Physical Exam:   General: well-appearing, sitting upright in chair   HEENT: atraumatic, normocephalic   CV: RRR, murmur present - notes she has had that in the past  Pulm: CTAB  GI: soft, NTND  MSK: no LE edema  Neuro: A&Ox4      Continuous Infusions:         Scheduled Medications:    amlodipine  10 mg Oral Daily    atorvastatin  10 mg Oral Daily    carvedilol  25 mg Oral BID    chlorthalidone  25 mg Oral Daily    cholecalciferol (vitamin D3-10 mcg (400 unit))  50 mcg Oral Daily    ertapenem  1 g Intravenous Daily    heparin (porcine) for subcutaneous use  5,000 Units Subcutaneous Q8H SCH    heparin, porcine (PF)  200 Units Intravenous Q MWF    hydrALAZINE  50 mg Oral BID    insulin glargine  3 Units Subcutaneous Nightly    insulin lispro  0-20 Units Subcutaneous ACHS    levothyroxine  112 mcg Oral daily    mycophenolate  500 mg Oral BID    nystatin  500,000 Units Oral Q8H    predniSONE  5 mg Oral Daily    [Provider Hold] spironolactone  25 mg Oral Daily    [Provider Hold] sulfamethoxazole-trimethoprim  1 tablet Oral Q MWF    tacrolimus  1 mg Oral BID       PRN medications:  acetaminophen, aluminum-magnesium hydroxide-simethicone, dextrose in water, glucagon, glucose, guaiFENesin, melatonin, ondansetron, senna    Data/Imaging Review: Reviewed in Epic and personally interpreted on 02/01/2023.  See EMR for detailed results.

## 2023-02-01 NOTE — Unmapped (Signed)
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  Upon bedside assessment was able to restore full patency using 10 ml vacuum syringe method. Brisk blood return noted, flushes freely. Primary RN aware.         Thank you for this consult,  Shelva Majestic Davinci Glotfelty, RN, CVAD Liaison    Consult Time 15 minutes (min)

## 2023-02-01 NOTE — Unmapped (Signed)
Tacrolimus Therapeutic Monitoring Pharmacy Note    Julie [name] is a 72 y.o. female continuing tacrolimus.     Indication: Kidney transplant     Date of Transplant:  08/06/22       Prior Dosing Information: Home regimen 1 mg in am and 0.5 mg in evening . Dose currently 1mg  bid    Source(s) of information used to determine prior to admission dosing: Home Medication List    Goals:  Therapeutic Drug Levels  Tacrolimus trough goal:  5-7 ng/ml    Additional Clinical Monitoring/Outcomes  Monitor renal function (SCr and urine output) and liver function (LFTs)  Monitor for signs/symptoms of adverse events (e.g., hyperglycemia, hyperkalemia, hypomagnesemia, hypertension, headache, tremor)    Results:   Tacrolimus level:  6.8 ng/mL, drawn appropriately    Pharmacokinetic Considerations and Significant Drug Interactions:  Concurrent hepatotoxic medications: None identified  Concurrent CYP3A4 substrates/inhibitors: None identified  Concurrent nephrotoxic medications: None identified    Assessment/Plan:  Recommendedation(s)  Continue current regimen of 1mg  bid.  Change monitoring to MWF troughs    Follow-up  Tac levels ordered for MWF at 0600.   A pharmacist will continue to monitor and recommend levels as appropriate    Please page service pharmacist with questions/clarifications.    Myer Haff Campbell-Bright, RPH

## 2023-02-02 LAB — CBC
HEMATOCRIT: 21.4 % — ABNORMAL LOW (ref 34.0–44.0)
HEMOGLOBIN: 7.1 g/dL — ABNORMAL LOW (ref 11.3–14.9)
MEAN CORPUSCULAR HEMOGLOBIN CONC: 33.4 g/dL (ref 32.0–36.0)
MEAN CORPUSCULAR HEMOGLOBIN: 31.5 pg (ref 25.9–32.4)
MEAN CORPUSCULAR VOLUME: 94.3 fL (ref 77.6–95.7)
MEAN PLATELET VOLUME: 8.3 fL (ref 6.8–10.7)
PLATELET COUNT: 95 10*9/L — ABNORMAL LOW (ref 150–450)
RED BLOOD CELL COUNT: 2.26 10*12/L — ABNORMAL LOW (ref 3.95–5.13)
RED CELL DISTRIBUTION WIDTH: 13.3 % (ref 12.2–15.2)
WBC ADJUSTED: 2.7 10*9/L — ABNORMAL LOW (ref 3.6–11.2)

## 2023-02-02 LAB — BASIC METABOLIC PANEL
ANION GAP: 3 mmol/L — ABNORMAL LOW (ref 5–14)
BLOOD UREA NITROGEN: 28 mg/dL — ABNORMAL HIGH (ref 9–23)
BUN / CREAT RATIO: 20
CALCIUM: 8.4 mg/dL — ABNORMAL LOW (ref 8.7–10.4)
CHLORIDE: 111 mmol/L — ABNORMAL HIGH (ref 98–107)
CO2: 22 mmol/L (ref 20.0–31.0)
CREATININE: 1.42 mg/dL — ABNORMAL HIGH
EGFR CKD-EPI (2021) FEMALE: 39 mL/min/{1.73_m2} — ABNORMAL LOW (ref >=60)
GLUCOSE RANDOM: 286 mg/dL — ABNORMAL HIGH (ref 70–179)
POTASSIUM: 5.5 mmol/L — ABNORMAL HIGH (ref 3.4–4.8)
SODIUM: 136 mmol/L (ref 135–145)

## 2023-02-02 LAB — POTASSIUM
POTASSIUM: 6.2 mmol/L (ref 3.4–4.8)
POTASSIUM: 5.5 mmol/L — ABNORMAL HIGH (ref 3.4–4.8)

## 2023-02-02 LAB — TACROLIMUS LEVEL, TROUGH: TACROLIMUS, TROUGH: 5.7 ng/mL (ref 5.0–15.0)

## 2023-02-02 LAB — MAGNESIUM: MAGNESIUM: 1.5 mg/dL — ABNORMAL LOW (ref 1.6–2.6)

## 2023-02-02 MED ADMIN — hydrALAZINE (APRESOLINE) tablet 50 mg: 50 mg | ORAL | @ 09:00:00 | Stop: 2023-02-02

## 2023-02-02 MED ADMIN — tacrolimus (PROGRAF) capsule 1 mg: 1 mg | ORAL | @ 01:00:00

## 2023-02-02 MED ADMIN — ertapenem (INVANZ) 1 g in sodium chloride 0.9 % (NS) 100 mL IVPB-MBP: 1 g | INTRAVENOUS | @ 13:00:00 | Stop: 2023-02-04

## 2023-02-02 MED ADMIN — nystatin (MYCOSTATIN) oral suspension: 500000 [IU] | ORAL | @ 12:00:00 | Stop: 2023-02-04

## 2023-02-02 MED ADMIN — amlodipine (NORVASC) tablet 10 mg: 10 mg | ORAL | @ 13:00:00

## 2023-02-02 MED ADMIN — levothyroxine (SYNTHROID) tablet 112 mcg: 112 ug | ORAL | @ 09:00:00

## 2023-02-02 MED ADMIN — predniSONE (DELTASONE) tablet 5 mg: 5 mg | ORAL | @ 13:00:00

## 2023-02-02 MED ADMIN — chlorthalidone (HYGROTON) tablet 25 mg: 25 mg | ORAL | @ 13:00:00

## 2023-02-02 MED ADMIN — magnesium sulfate 2gm/50mL IVPB: 2 g | INTRAVENOUS | @ 13:00:00 | Stop: 2023-02-02

## 2023-02-02 MED ADMIN — mycophenolate (CELLCEPT) capsule 500 mg: 500 mg | ORAL | @ 12:00:00

## 2023-02-02 MED ADMIN — insulin glargine (LANTUS) injection 4 Units: 4 [IU] | SUBCUTANEOUS | @ 01:00:00

## 2023-02-02 MED ADMIN — dextrose (D10W) 10% bolus 250 mL: 25 g | INTRAVENOUS | @ 05:00:00 | Stop: 2023-02-02

## 2023-02-02 MED ADMIN — atorvastatin (LIPITOR) tablet 10 mg: 10 mg | ORAL | @ 13:00:00

## 2023-02-02 MED ADMIN — nystatin (MYCOSTATIN) oral suspension: 500000 [IU] | ORAL | @ 03:00:00 | Stop: 2023-02-04

## 2023-02-02 MED ADMIN — sulfamethoxazole-trimethoprim (BACTRIM) 400-80 mg tablet 80 mg of trimethoprim: 1 | ORAL | @ 13:00:00 | Stop: 2023-02-02

## 2023-02-02 MED ADMIN — hydrALAZINE (APRESOLINE) tablet 75 mg: 75 mg | ORAL | @ 18:00:00

## 2023-02-02 MED ADMIN — carvedilol (COREG) tablet 25 mg: 25 mg | ORAL | @ 13:00:00

## 2023-02-02 MED ADMIN — heparin (porcine) 5,000 unit/mL injection 5,000 Units: 5000 [IU] | SUBCUTANEOUS | @ 18:00:00

## 2023-02-02 MED ADMIN — carvedilol (COREG) tablet 25 mg: 25 mg | ORAL | @ 01:00:00

## 2023-02-02 MED ADMIN — hydrALAZINE (APRESOLINE) injection 10 mg: 10 mg | INTRAVENOUS | @ 04:00:00

## 2023-02-02 MED ADMIN — insulin regular (HumuLIN,NovoLIN) injection 5 Units: 5 [IU] | INTRAVENOUS | @ 05:00:00 | Stop: 2023-02-02

## 2023-02-02 MED ADMIN — cholecalciferol (vitamin D3 25 mcg (1,000 units)) tablet 50 mcg: 50 ug | ORAL | @ 13:00:00

## 2023-02-02 MED ADMIN — heparin (porcine) 5,000 unit/mL injection 5,000 Units: 5000 [IU] | SUBCUTANEOUS | @ 01:00:00

## 2023-02-02 MED ADMIN — mycophenolate (CELLCEPT) capsule 500 mg: 500 mg | ORAL | @ 01:00:00

## 2023-02-02 MED ADMIN — sodium zirconium cyclosilicate (LOKELMA) packet 10 g: 10 g | ORAL | @ 05:00:00 | Stop: 2023-02-02

## 2023-02-02 MED ADMIN — hydrALAZINE (APRESOLINE) tablet 50 mg: 50 mg | ORAL | @ 01:00:00

## 2023-02-02 MED ADMIN — tacrolimus (PROGRAF) capsule 1 mg: 1 mg | ORAL | @ 12:00:00

## 2023-02-02 MED ADMIN — insulin lispro (HumaLOG) injection 0-20 Units: 0-20 [IU] | SUBCUTANEOUS | @ 21:00:00

## 2023-02-02 MED ADMIN — nystatin (MYCOSTATIN) oral suspension: 500000 [IU] | ORAL | @ 18:00:00 | Stop: 2023-02-04

## 2023-02-02 MED ADMIN — heparin (porcine) 5,000 unit/mL injection 5,000 Units: 5000 [IU] | SUBCUTANEOUS | @ 09:00:00

## 2023-02-02 NOTE — Unmapped (Signed)
Tacrolimus Therapeutic Monitoring Pharmacy Note    Julie [name] is a 72 y.o. female continuing tacrolimus.     Indication: Kidney transplant     Date of Transplant:  08/06/2022       Prior Dosing Information:  Home regimen 1 mg in am and 0.5 mg in evening . Dose currently 1mg  bid      Source(s) of information used to determine prior to admission dosing: Home Medication List    Goals:  Therapeutic Drug Levels  Tacrolimus trough goal:  5-7 ng/mL    Additional Clinical Monitoring/Outcomes  Monitor renal function (SCr and urine output) and liver function (LFTs)  Monitor for signs/symptoms of adverse events (e.g., hyperglycemia, hyperkalemia, hypomagnesemia, hypertension, headache, tremor)    Results:   Tacrolimus level:  5.7 ng/mL, drawn appropriately    Pharmacokinetic Considerations and Significant Drug Interactions:  Concurrent hepatotoxic medications: None identified  Concurrent CYP3A4 substrates/inhibitors: None identified  Concurrent nephrotoxic medications: None identified    Assessment/Plan:  Recommendedation(s)  Continue current regimen of 1 mg BID    Follow-up  Next level has been ordered on MWF at 0600 .   A pharmacist will continue to monitor and recommend levels as appropriate    Please page service pharmacist with questions/clarifications.    Clarene Duke, PharmD

## 2023-02-02 NOTE — Unmapped (Addendum)
Transplant Nephrology Follow-Up Consult note      Assessment/Recommendations: Julie [name]  is a 72 y.o. 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     # Status post kidney transplant, Allograft Function (unstable):   DDKT 08/06/2022 2/2 to DM  KDPI: 56% CPRA 0  Follows with Dr. Elvera Maria   Baseline cr 0.9-1.1  Peaked at 1.97, down trending, improving, back to baseline  Adequate UO    #Fever/Septic shock  In the setting of recurrent UTI  ESBL ecoli bacteremia on prior admission  BC/UC negative on this admission, however elevated WBC on UA, bac few  EBV/CMV negative  Originally treated with vanc cefepime, now transition to meropenem due to ongoing febrile illness?  ID following  Foley removed 7/7  PVR around 230 cc, outpatient urodynamic studies recc    #Perinephric fluid collection  CT abd pelvis w/o con . Similar size of the 7.4 x 1.0 x 5.4 cm crescentic fluid collection within the soft tissues lateral to the transplant kidney, previously 8.3 x 0.9 x 5.5 cm (2:84, 4:30). There is increased inflammatory changes and thickening involving the adjacent oblique and transversus abdominis musculature, similar to prior. Thickening of the adjacent peritoneum also appears similar to prior.   drained 10/10/2022 with no growth on culture  Does not appear to be infectious  Per ID likely transplanted kidney is source  Planned for R tunneled cath with 6 weeks or ertapenem    # Immunosuppression [High risk medical decision making for drug therapy requiring intensive monitoring for toxicity]  On home pred 5 mg daily, MMF 500 BID, tac BID  trough goal 5-7  Reassess tac dosing daily  Cont as per above    # Infectious Prophylaxis and Monitoring:   -CMV D+/R+, EBV D+/R+ - completed prophylaxis for all except PJP - can switch to atovaquone if tolerated for last week (end 02/04/2023)  - f/u PCR of both  - currently being treated for recurrent UTI (despite suppressive therapy) though fluid collection peri-transplant appears to be stable  - moderate risk for Hep B reactivation - LFTs, HBV DNA, HBsAg q 3 months    # Blood Pressure / Volume:  home regimen of coreg 25bid, chlorthalidone 25mg , hydralazine 50mg  bid, spironolactone 25mg  ,amlodipine 5mg  bid  Currently on hydralazine 50 mg bid, chlorthalidone 25 mg daily, carvedilol 25 mg BID and amlodpine 10 mg daily, spironolactone restarted    #Elevated pulse pressure  Significant low DBP 30-40s, PP 150s  CT abd pelvis w/o con 7/6 with significant burden of atherosclerotic cal of abd aorta and branches  likely in the setting of ESRD causing stiff vasculature.     #Normocytic Anemia  At baseline 8-9   Iron panel 12/29/2022 normal iron, iron sat, low tibc  Received EPO 10k 7/10      OVERALL RECOMMENDATIONS  Hold spironolactone  No need for IVF  Increase hydralazine to 100 mg tid for elevated BP >130/80  Low potassium diet    **Transplant patients with an open wound require wound care with sterile water only. The patient should be counseled on this at the time of discharge if they have not already been doing this.**     Patient was seen and discussed with attending nephrologist Dr. Vickie Epley. Recommendations were communicated to the primary team. Nephrology will continue to  follow.    Thank you for this consult.   Please page the renal fellow on call with questions/concerns.    Worthy Keeler, DO  Division of Nephrology and Hypertension  [facility]  02/02/2023      Transplant Background  Date of Transplant: 08/06/2022 (Kidney)  Type of Transplant: DCD  KDPI: 56%  Cold ischemic time: 877 minutes (14 hr 37 min)  Warm ischemic time: 32 minutes  cPRA: 0%  HLA match:   Blood type: Donor A1, Recipient A POS  ID: CMV D+/R+, EBV D+/R+  Native Kidney Disease: Diabetes Native kidney biopsy: Diabetic nephropathy (done elsewhere)              Pre-transplant dialysis course: PD started July 2017, transitioned to HD October 2019 due to catheter dysfunction/inadequate dialysis  Prior Transplants: None  Induction: Basiliximab  Early steroid withdrawal: No     Biopsies:   None    ___________________________________________________________    Subjective/Interval History: Patient seen and examined at bedside in NAD. BP remains elevated with systolics above 200 on examination. Denies any headache, chest pain, shortness of breath, lightheadedness or dizziness.    Medications:   Current Facility-Administered Medications   Medication Dose Route Frequency Provider Last Rate Last Admin    acetaminophen (TYLENOL) tablet 1,000 mg  1,000 mg Oral Q8H PRN Trina Ao, MD   1,000 mg at 01/29/23 0542    alteplase (ACTIVase) injection small catheter clearance 1 mg  1 mg Intravenous Once Anastasio Champion, MD        aluminum-magnesium hydroxide-simethicone (MAALOX MAX) 80-80-8 mg/mL oral suspension  30 mL Oral Q4H PRN Bernita Raisin, MD        amlodipine (NORVASC) tablet 10 mg  10 mg Oral Daily Estell Harpin, MD   10 mg at 02/02/23 1610    atorvastatin (LIPITOR) tablet 10 mg  10 mg Oral Daily Bernita Raisin, MD   10 mg at 02/02/23 0838    carvedilol (COREG) tablet 25 mg  25 mg Oral BID Trina Ao, MD   25 mg at 02/02/23 0838    chlorthalidone (HYGROTON) tablet 25 mg  25 mg Oral Daily Estell Harpin, MD   25 mg at 02/02/23 9604    cholecalciferol (vitamin D3 25 mcg (1,000 units)) tablet 50 mcg  50 mcg Oral Daily Anastasio Champion, MD   50 mcg at 02/02/23 0838    dextrose (D10W) 10% bolus 125 mL  12.5 g Intravenous Q10 Min PRN Bernita Raisin, MD        ertapenem [facility]) 1 g in sodium chloride 0.9 % (NS) 100 mL IVPB-MBP  1 g Intravenous Daily Rayetta Pigg, MD   Stopped at 02/02/23 [phone]    Followed by    Melene Muller ON 02/04/2023] ertapenem (INVANZ) 500 mg in sodium chloride (NS) 0.9 % 50 mL IVPB  500 mg Intravenous Q24H Rayetta Pigg, MD        glucagon injection 1 mg  1 mg Intramuscular Once PRN Bernita Raisin, MD        glucose chewable tablet 16 g  16 g Oral Q10 Min PRN Bernita Raisin, MD        guaiFENesin (ROBITUSSIN) oral syrup  200 mg Oral Q4H PRN Bernita Raisin, MD        heparin (porcine) 5,000 unit/mL injection 5,000 Units  5,000 Units Subcutaneous [facility] Myriam Jacobson [place], DO   5,000 Units at 02/02/23 1418    hydrALAZINE (APRESOLINE) tablet 75 mg  75  mg Oral [facility] Barnett Abu, MD   75 mg at 02/02/23 1419    insulin glargine (LANTUS) injection 4 Units  4 Units Subcutaneous Nightly Rayetta Pigg, MD   4 Units at 02/01/23 2101    insulin lispro (HumaLOG) injection 0-20 Units  0-20 Units Subcutaneous ACHS Barnett Abu, MD        levothyroxine (SYNTHROID) tablet 112 mcg  112 mcg Oral daily Trina Ao, MD   112 mcg at 02/02/23 0523    melatonin tablet 3 mg  3 mg Oral Nightly PRN Bernita Raisin, MD   3 mg at 01/30/23 2101    mycophenolate (CELLCEPT) capsule 500 mg  500 mg Oral BID Barnett Abu, MD   500 mg at 02/02/23 0748    nystatin (MYCOSTATIN) oral suspension  500,000 Units Oral Q8H Park Breed, MD   500,000 Units at 02/02/23 1418    ondansetron (ZOFRAN-ODT) disintegrating tablet 4 mg  4 mg Oral Q6H PRN Trina Ao, MD        predniSONE (DELTASONE) tablet 5 mg  5 mg Oral Daily Myriam Jacobson Jae, DO   5 mg at 02/02/23 3329    senna (SENOKOT) tablet 2 tablet  2 tablet Oral Nightly PRN Bernita Raisin, MD        tacrolimus (PROGRAF) capsule 1 mg  1 mg Oral BID Barnett Abu, MD   1 mg at 02/02/23 0748        Physical Exam:  Vitals:    02/02/23 1419   BP: 145/47   Pulse:    Resp:    Temp:    SpO2:      I/O this shift:  In: 150 [IV Piggyback:150]  Out: 600 [Urine:600]    Intake/Output Summary (Last 24 hours) at 02/02/2023 1503  Last data filed at 02/02/2023 1200  Gross per 24 hour   Intake 550 ml   Output 2150 ml   Net -1600 ml General: well-appearing, NAD  HEENT: conjunctival anicteric  CV: RRR nl s1/s2 no s3/s4 3/6 systolic murmur, no JVD, no LE edema  Lungs: nl WOB, CTAB w/o adventitious sounds  Abd: soft, non-tender, non-distended w/o r/g  Skin: well healed L sided transplant scar  Psych: alert, engaged, appropriate mood and affect  Neuro: normal speech, no gross focal deficits     LABS/IMAGING: Lab and imaging results from last 24 hrs noted and reviewed in EMR.

## 2023-02-02 NOTE — Unmapped (Signed)
IMMUNOCOMPROMISED HOST INFECTIOUS DISEASE PROGRESS NOTE    Attending Attestation:  Based on my evaluation, I agree with the findings, assessment, and the plan of care as documented in the advanced practice provider's (APP) note. It was medically necessary for me to see the patient in addition to the APP because of the complexity of the infectious disease. The patient is at risk of decompensation from infection due to underlying immunocompromise and/or mucosal barrier defects. I personally performed the substantive portion of this visit which included a review of this patient's lab results, microbiology data, current medications, and relevant radiological studies for medical decision making .    10 minutes spent face-to-face and non-face-to-face in the care of this patient, which includes all pre, intra, and post visit time on the date of service which was specific to E/M visit.     Aditya Nastasi Se??a, MD MPH  Professor, [facility] Infectious Diseases   Pager [phone]     Assessment/Plan:     Julie [name] is a 72 y.o. 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 Had perinephric drain in place postoperatively, removed 08/30/22  - Immunosuppression: Tacrolimus (goal 5-7), MMF 500 BID (reduced 250mg  bid on 7/6) Pred 5  - Prophylaxis: valganciclovir x 3 months (mod risk, completed), TMP+SMX x 6 months  - baseline Cr ~1.1  - AKI 01/27/23 Estimated Creatinine Clearance: 25.3 mL/min (A) (based on SCr of 1.42 mg/dL (H)).     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 Dubliensis 08/05/22, <10K, negative recipient samples, negative donor blood cx, treated with 2 weeks PO fluconazole  # Positive Hep B sAg and core antibodies, c/w resolved infection, with moderate risk for reactivation 02/2021  - 12/28/22 HBV DNA not detected and HBsAg pending  # Recurrent MDR E.Coli Bacteremia/UTI  - 1/29 BCx/UCx E. Coli - s/p Levo x 3 weeks (thru stent removal, 3/15) (S: Cefazolin, Cipro/Levo, Pip-tazo, Cephalexin; R: TMP-SMX, Amp, Amp-sulb)  - 3/14 BCx/UCx ESBL/FQ-R E. Coli (S: Erta, Gent, Mero, Tobra, Cefepime SDD; I: ceftaz; R: Cipro/Levo, Cefazolin, Ceftriaxone, Amp) s/p 14 days Meropenem   - 4/13 Bcx/Ucx E. Coli (R CFZ, CTX, cipro/levo, I ceftaz, SDD CFP, susc nitro) s/p mero x 2 weeks  - 5/7 seen by urogyn, no structural issues planned to stop nitrofurantoin with switch to methenamine (not done), added vaginal estrogen/d-mannose.  - 6/3 UCx <10,000 E. Coli (S: Cefepime SDD; Erta/Mero, Gent, Nitrofurantoin, Tobra) s/p ertapenem until 01/15/23  - 4/29 started nitrofurantoin prophylaxis; not restarted after 6/24 due to reduced GFR and breakthrough in 12/2022  Active infection:  #Perinephric fluid collection 10/05/22, slowly improving 01/27/23  - 3/14 BCx/UCx ESBL/FQ-R E. Coli (S: Erta, Gent, Mero, Tobra, Cefepime SDD; I: ceftaz; R: Cipro/Levo, Cefazolin, Ceftriaxone, Amp) s/p 14 days Meropenem   - 3/19 IR guided drainage negative cx, Cr 1.5 not suggestive of urinoma  - 4/13 increased stranding, 4/16 not amenable to intervention per VIR  - 01/27/23 CT a/p 7.4 x 1.0 x 5.4 cm crescentic fluid collection within the soft tissues lateral to the transplant kidney (8.3 x 0.9 x 5.5 cm on 11/05/22)  - 7/10 VIR: collection not large enough to aspirate     # Fever and septic shock, presumed urinary source given pyuria and recurrent UTI history 01/27/23  - 7/6 3x emesis in the ED with sudden acute change in mental status, prompting intubation for airway protection --> extubated 7/7  - 7/6 flu/RSV/covid neg; UCx <1,000 CFU (appears on plates as E. Coli); BCx (NGTD)  - 7/6 CXR more likely edema or aspiration then pneumonia  - 7/6 CT A/P: Bibasilar consolidation. No hydronephrosis. No solid renal mass. Atrophic bilateral native kidneys. Similar appearance of nonobstructive punctate calculi and vascular calcifications. Subcentimeter hypoattenuating lesions within the renal transplant, too small to characterize. No hydronephrosis. Similar inflammatory stranding surrounding the transplant kidney, particularly around the hilum. 7.4 x 1.0 x 5.4 cm crescentic fluid collection within the soft tissues lateral to the transplant kidney (8.3 x 0.9 x 5.5 cm on 11/05/22)  - 7/7 renal transplant [place]: The renal transplant was located in the left lower quadrant. Normal size and echogenicity. 2 subcentimeter anechoic cysts are present. There is pelvic caliectasis.   Rx 7/6 vanc/cefepime -> 7/7 Vanc, mero--> 7/8 meropenem--> 7/9 Ertapenem        Antimicrobial allergy/intolerance:   Penicillin (see allergy list for nuances)  Valacyclovir - nausea         RECOMMENDATIONS    Diagnosis  Tunneled line planned for 7/15-7/17    Management  Continue Ertapenem IV (renally dose)  Duration: minimum 6-8 weeks  Unclear if pt is having recurrent UTIs or a persistent reservoir based on fluid and stranding around transplanted kidney requiring long-term IV abx, followed by appropriate oral prophylaxis agent    Antimicrobial prophylaxis required for transplant immunosuppression   Continue tmp-smx  (END: 02/04/23)  Resolved HBV: obtain LFTs monthly and HBV DNA and SAg for any rise in LFTs    Intensive toxicity monitoring for prescription antimicrobials   CBC w/diff at least once per week  CMP at least once per week  clinical assessments for rashes or other skin changes    Solid Organ Transplant Infectious Diseases Follow-up Instructions  Appointment: PENDING  Location: [facility], [address], [place], [place]  Labs: weekly CBC with differential, CMP  Please fax labs to patient???s transplant coordinator: Celene Squibb at  [phone] (Kidney/Pancreas) and to Michela Pitcher (ICHID pharmD at [phone])   Antibiotics:   Ertapenem IV 500mg  daily Antibiotic End Date: TBD      The [facility] ID APP service will continue to follow.           Please page Darolyn Rua, NP at [phone] from 8-4:30pm, after 4:30 pm & on weekends, please page the ID Transplant/Liquid Oncology Fellow consult at [phone] with questions.    Patient discussed with Dr. Estevan Ryder.      Darolyn Rua, MSN, APRN, AGNP-C  Immunocompromised Infectious Disease Nurse Practitioner    I personally spent 10 minutes face-to-face and non-face-to-face in the care of this  patient, which includes all pre, intra, and post visit time on the date of service.  All documented time was specific to the E/M visit and does not include any procedures that may have been performed.      Subjective:     External record(s): Consultant note(s): Transplant nephrology note reviewed and noted plan to increase hydralazine for HTN .    Independent historian(s): no independent historian required.       Interval History:   Afebrile and NAEON. Patient continues to endorse being ready to go home and denies all urinary symptoms.     Medications:  Current Medications as of 02/02/2023  Scheduled  PRN   alteplase, 1 mg, Once  amlodipine, 10 mg, Daily  atorvastatin, 10 mg, Daily  carvedilol, 25 mg, BID  chlorthalidone, 25 mg, Daily  cholecalciferol (vitamin D3-10 mcg (400 unit)), 50 mcg, Daily  ertapenem, 1 g, Daily   Followed by  [START ON 02/04/2023] ertapenem, 500 mg, Q24H  heparin (porcine) for subcutaneous use, 5,000 Units, Q8H SCH  hydrALAZINE, 75 mg, Q8H SCH  insulin glargine, 4 Units, Nightly  insulin lispro, 0-20 Units, ACHS  levothyroxine, 112 mcg, daily  mycophenolate, 500 mg, BID  nystatin, 500,000 Units, Q8H  predniSONE, 5 mg, Daily  tacrolimus, 1 mg, BID      acetaminophen, 1,000 mg, Q8H PRN  aluminum-magnesium hydroxide-simethicone, 30 mL, Q4H PRN  dextrose in water, 12.5 g, Q10 Min PRN  glucagon, 1 mg, Once PRN  glucose, 16 g, Q10 Min PRN  guaiFENesin, 200 mg, Q4H PRN  melatonin, 3 mg, Nightly PRN  ondansetron, 4 mg, Q6H PRN  senna, 2 tablet, Nightly PRN         Objective:     Vital Signs last 24 hours:  Temp:  [36.6 ??C (97.9 ??F)-36.9 ??C (98.5 ??F)] 36.8 ??C (98.2 ??F)  Heart Rate:  [59-65] 59  SpO2 Pulse:  [59-63] 59  Resp:  [15-25] 20  BP: (145-198)/(27-47) 145/47  MAP (mmHg):  [78-98] 80  SpO2:  [96 %-100 %] 100 %    Physical Exam:   Patient Lines/Drains/Airways Status       Active Active Lines, Drains, & Airways       Name Placement date Placement time Site Days    CVC Triple Lumen 01/28/23 Non-tunneled Left Internal jugular 01/28/23  0016  Internal jugular  5    Arteriovenous Fistula - Vein Graft  Access 08/24/16 Left;Upper Arm 08/24/16  --  Arm  2353                  Const [x]  vital signs above    []  NAD, non-toxic appearance []  Chronically ill-appearing, non-distressed  Sitting in chair; family at bedside      Eyes [x]  Lids normal bilaterally, conjunctiva anicteric and noninjected OU     [] PERRL  [] EOMI        ENMT [x]  Normal appearance of external nose and ears, no nasal discharge        [x]  MMM, no lesions on lips or gums [x]  No thrush, leukoplakia, oral lesions  []  Dentition good []  Edentulous []  Dental caries present  []  Hearing normal  []  TMs with good light reflexes bilaterally         Neck [x]  Neck of normal appearance and trachea midline        []  No thyromegaly, nodules, or tenderness   []  Full neck ROM        Lymph []  No LAD in neck     []   No LAD in supraclavicular area     []  No LAD in axillae   []  No LAD in epitrochlear chains     []  No LAD in inguinal areas        CV []  RRR            []  No peripheral edema     []  Pedal pulses intact   []  No abnormal heart sounds appreciated   [x]  Extremities WWP   Murmur present      Resp [x]  Normal WOB at rest    []  No breathlessness with speaking, no coughing  []  CTA anteriorly    []  CTA posteriorly          GI [x]  Normal inspection, NTND   []  NABS     []  No umbilical hernia on exam       []  No hepatosplenomegaly     []  Inspection of perineal and perianal areas normal        GU []  Normal external genitalia     [] No urinary catheter present in urethra   []  No CVA tenderness    []  No tenderness over renal allograft        MSK []  No clubbing or cyanosis of hands       []  No vertebral point tenderness  []  No focal tenderness or abnormalities on palpation of joints in RUE, LUE, RLE, or LLE        Skin [x]  No rashes, lesions, or ulcers of visualized skin     []  Skin warm and dry to palpation   LIJ with dressing c/d/i      Neuro [x]  Face expression symmetric  []  Sensation to light touch grossly intact throughout    []  Moves extremities equally    []  No tremor noted        []  CNs II-XII grossly intact     []  DTRs normal and symmetric throughout []  Gait unremarkable        Psych [x]  Appropriate affect       []  Fluent speech         [x]  Attentive, good eye contact  [x]  Oriented to person, place, time          []  Judgment and insight are appropriate           Data for Medical Decision Making     01/27/23 EKG QTcF    I discussed mgm't w/qualified health care professional(s) involved in case: discussed with case manager patient's discharge plan .    I reviewed CBC results (WBC continues to down trend), chemistry results (remains hyperkalemic but improved since yesterday; creatinine similar to prior), and micro result(s) (blood cultures finalized at no growth).    I independently visualized/interpreted not done.       Recent Labs     Units 01/27/23  1556 01/27/23  2004 01/28/23  0108 01/28/23  0422 02/02/23  0148   WBC 10*9/L 11.5*  --  9.6   < > 2.7*   HGB g/dL 16.1*  --  8.3*   < > 7.1*   PLT 10*9/L 78*  --  60*   < > 95*   NEUTROABS 10*9/L 10.5*  --   --   --   --    LYMPHSABS 10*9/L 0.4*  --   --   --   --    EOSABS 10*9/L 0.0  --   --   --   --    BUN mg/dL 44*   < >  43*   < > 28*   CREATININE mg/dL 1.61*   < > 0.96*   < > 1.42*   AST U/L 12  --  11  --   --    ALT U/L 11  --  8*  --   --    BILITOT mg/dL 0.6  --  0.4  --   --    ALKPHOS U/L 57  --  43*  --   --    K mmol/L 4.1   < > 3.8 - 3.7   < > 5.5* - 5.5*   MG mg/dL  --   --   --    < > 1.5*   CALCIUM mg/dL 9.5   < > 8.2*   < > 8.4*    < > = values in this interval not displayed.     Lab Results   Component Value Date    TACROLIMUS 5.7 02/02/2023    ALBUMIN 2.8 (L) 02/01/2023       Microbiology:  Microbiology Results (last day)       Procedure Component Value Date/Time Date/Time    Blood Culture [[id number]]  (Normal) Collected: 01/27/23 1749    Lab Status: Final result Specimen: Blood from 1 Peripheral Draw Updated: 02/01/23 1815     Blood Culture, Routine No Growth at 5 days    Blood Culture [[id number]]  (Normal) Collected: 01/27/23 1741    Lab Status: Final result Specimen: Blood from 1 Peripheral Draw Updated: 02/01/23 1815     Blood Culture, Routine No Growth at 5 days                Imaging:  No new imaging

## 2023-02-02 NOTE — Unmapped (Signed)
Pt A&Ox4 this shift. Pt has increasing potassium, pt shifted, lokelma given, electrolytes monitored throughout shift per orders. Good urine output, voiding independently. Pt hypertensive, PRN given as well as home meds scheduled. Will continue to monitor and support.   Problem: Adult Inpatient Plan of Care  Goal: Absence of Hospital-Acquired Illness or Injury  Intervention: Prevent Skin Injury  Recent Flowsheet Documentation  Taken 02/02/2023 0000 by Tonita Phoenix, RN  Positioning for Skin: Right  Taken 02/01/2023 2200 by Tonita Phoenix, RN  Positioning for Skin: Left  Device Skin Pressure Protection: absorbent pad utilized/changed  Skin Protection: adhesive use limited  Taken 02/01/2023 2000 by Tonita Phoenix, RN  Positioning for Skin: Supine/Back  Device Skin Pressure Protection: absorbent pad utilized/changed  Skin Protection: adhesive use limited     Problem: Fall Injury Risk  Goal: Absence of Fall and Fall-Related Injury  Intervention: Promote Injury-Free Environment  Recent Flowsheet Documentation  Taken 02/01/2023 2000 by Tonita Phoenix, RN  Safety Interventions: environmental modification     Problem: Skin Injury Risk Increased  Goal: Skin Health and Integrity  Intervention: Optimize Skin Protection  Recent Flowsheet Documentation  Taken 02/02/2023 0000 by Tonita Phoenix, RN  Pressure Reduction Techniques: frequent weight shift encouraged  Pressure Reduction Devices: positioning supports utilized  Taken 02/01/2023 2200 by Tonita Phoenix, RN  Pressure Reduction Techniques: frequent weight shift encouraged  Pressure Reduction Devices: positioning supports utilized  Skin Protection: adhesive use limited  Taken 02/01/2023 2000 by Tonita Phoenix, RN  Activity Management: up ad lib  Pressure Reduction Techniques: frequent weight shift encouraged  Pressure Reduction Devices: positioning supports utilized  Skin Protection: adhesive use limited  Taken 02/01/2023 1945 by Tonita Phoenix, RN  Head of Bed [facility]) Positioning: HOB at 60-90 degrees     Problem: Adult Inpatient Plan of Care  Goal: Absence of Hospital-Acquired Illness or Injury  Intervention: Identify and Manage Fall Risk  Recent Flowsheet Documentation  Taken 02/01/2023 2000 by Tonita Phoenix, RN  Safety Interventions: environmental modification  Intervention: Prevent Skin Injury  Recent Flowsheet Documentation  Taken 02/02/2023 0000 by Tonita Phoenix, RN  Positioning for Skin: Right  Taken 02/01/2023 2200 by Tonita Phoenix, RN  Positioning for Skin: Left  Device Skin Pressure Protection: absorbent pad utilized/changed  Skin Protection: adhesive use limited  Taken 02/01/2023 2000 by Tonita Phoenix, RN  Positioning for Skin: Supine/Back  Device Skin Pressure Protection: absorbent pad utilized/changed  Skin Protection: adhesive use limited  Intervention: Prevent Infection  Recent Flowsheet Documentation  Taken 02/01/2023 2000 by Tonita Phoenix, RN  Infection Prevention: rest/sleep promoted

## 2023-02-02 NOTE — Unmapped (Signed)
MICU Daily Progress Note     Date of Service: 02/02/2023    Problem List:  Principal Problem:    Sepsis with encephalopathy without septic shock (CMS-HCC)  Active Problems:    Type 2 diabetes mellitus with diabetic nephropathy, with long-term current use of insulin (CMS-HCC)    End-stage renal disease (CMS-HCC)    Kidney transplant recipient    Anemia due to stage 5 chronic kidney disease, not on chronic dialysis (CMS-HCC)    Immunosuppressed status (CMS-HCC)    Hypertension    Hyperlipidemia    Chronic idiopathic thrombocytopenia (CMS-HCC)    Acute metabolic encephalopathy    AKI (acute kidney injury) (CMS-HCC)    Acute metabolic acidosis  Resolved Problems:    * No resolved hospital problems. *    Interval history: Julie [name] is a 72 y.o. 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 Awaiting floor bed.     24hr events:   - Continues on IV ertapenem  - Awaiting tunneled access  - Spiro discontinued  - Added hydral PO now 75 mg TID    Neurological   Metabolic encephalopathy now resolved    Analgesia: No pain issues  RASS at goal? No - adjusting sedation or order to reflect need  [place] Agitation Assessment Scale (RASS) : 0 (02/02/2023  4:00 AM)      Pulmonary   No acute interventions         Cardiovascular   Hypertension  History of refractory hypertension, still with elevated systolics in the 150s to 190s overnight.  Holding home spironolactone in the setting of hyperkalemia, continue chlorthalidone 25 mg daily, carvedilol 25 mg daily, amlodipine 10 mg daily and recently added hydralazine 75 mg TID. Would AVOID IV hydral and labetalol for asymptomatic severe hypertension without signs of end stage organ dysfunction.    HLD: continue home atorvastatin 10 mg daily     Renal   AKI on CKD - S/p L DDKT 08/06/22 2/2 diabetic nephropathy   Recently with slight bump in creatinine to 1.59 which has been gradually improving with IV fluids administration.  Giving an additional 500 cc LR this morning after discussions with nephrology team.  Continue to monitor intake and outtake and continue home tacrolimus for goal trough 5-7. BID BMP, mag, Phos continue home prednisone 5 mg daily and CellCept 500 mg twice daily    Hyperkalemia  BID lytes, discontinued spironolactone. Last dose bactrim 7/12.  Continue scheduled Lokelma 10 g 3 times daily    Infectious Disease/Autoimmune   Septic shock, resolved, from urinary source suspected pyelonephritis given stranding around left lower quadrant transplant kidney  Remains off pressors and afebrile on IV ertapenem (meropenem 7/6->erta 7/ 9 - ), will need prolonged several week long course per ID. Awaiting tunneled line and home infusion company.     Cultures:  Blood Culture, Routine (no units)   Date Value   01/27/2023 No Growth at 5 days   01/27/2023 No Growth at 5 days     Urine Culture, Comprehensive (no units)   Date Value   01/27/2023 NO SIGNIFICANT GROWTH: <1000 CFU/mL     WBC   Date Value   02/02/2023 2.7 10*9/L (L)   01/22/2023 6.3   01/16/2023 3.4 x10E3/uL  WBC Clumps (/HPF)   Date Value   01/27/2023 Few (A)     WBC, UA (/HPF)   Date Value   01/27/2023 >182 (H)          FEN/GI   NAI    Nutrition:  - PO diet    Provider Malnutrition Assessment:  Body mass index is 23.42 kg/m??.           Heme/Coag   Chronic thombocytopenia  -Daily CBC    Endocrine   T2DM, insulin-dependent  Increased aggressiveness of sliding scale, continue insulin glargine 4 units daily.  Target blood glucose 1 40-1 80.    Hypothyroidism: Continue home synthroid 112 mcg daily    Integumentary   NAI    Prophylaxis/LDA/Restraints/Consults   ICU Checklist completed: yes (see ICU rounding navigator in Epic)    Patient Lines/Drains/Airways Status       Active Active Lines, Drains, & Airways       Name Placement date Placement time Site Days    CVC Triple Lumen 01/28/23 Non-tunneled Left Internal jugular 01/28/23  0016  Internal jugular  5    Arteriovenous Fistula - Vein Graft  Access 08/24/16 Left;Upper Arm 08/24/16  --  Arm  2353                  Patient Lines/Drains/Airways Status       Active Wounds       None                    Goals of Care     Code Status: Full Code    Designated Healthcare Decision Maker:  Julie [name]'s current decisional capacity for healthcare decision-making is Full capacity. Her designated Educational psychologist) is/are   HCDM (patient stated preference) (Active): Julie [name] - [phone].      Subjective     As above    Objective     Vitals - past 24 hours  Temp:  [36.5 ??C (97.7 ??F)-37.2 ??C (99 ??F)] 36.9 ??C (98.5 ??F)  Heart Rate:  [60-66] 60  SpO2 Pulse:  [59-65] 60  Resp:  [17-25] 19  BP: (138-198)/(27-44) 177/34  SpO2:  [95 %-100 %] 96 % Intake/Output  I/O last 3 completed shifts:  In: 1540 [P.O.:440; IV Piggyback:1100]  Out: 2800 [Urine:2800]     Physical Exam:   General: Pleasant female seated in bed in no acute distress with her husband by the bedside  HEENT: atraumatic, normocephalic   CV: Regular rate and rhythm  Pulm: Breathing comfortably on room air  GI: soft, NTND  MSK: no LE edema  Neuro: A&Ox4      Continuous Infusions:         Scheduled Medications:    alteplase  1 mg Intravenous Once    amlodipine  10 mg Oral Daily    atorvastatin  10 mg Oral Daily    carvedilol  25 mg Oral BID    chlorthalidone  25 mg Oral Daily    cholecalciferol (vitamin D3-10 mcg (400 unit))  50 mcg Oral Daily    ertapenem  1 g Intravenous Daily    Followed by    Melene Muller ON 02/04/2023] ertapenem  500 mg Intravenous Q24H    heparin (porcine) for subcutaneous use  5,000 Units Subcutaneous Q8H SCH    heparin, porcine (PF)  200 Units Intravenous Q MWF    hydrALAZINE  50 mg Oral Q8H SCH insulin glargine  4 Units Subcutaneous Nightly    insulin  lispro  0-20 Units Subcutaneous ACHS    levothyroxine  112 mcg Oral daily    mycophenolate  500 mg Oral BID    nystatin  500,000 Units Oral Q8H    predniSONE  5 mg Oral Daily    sulfamethoxazole-trimethoprim  1 tablet Oral Q MWF    tacrolimus  1 mg Oral BID       PRN medications:  acetaminophen, alteplase **AND** alteplase, aluminum-magnesium hydroxide-simethicone, dextrose in water, glucagon, glucose, guaiFENesin, melatonin, ondansetron, senna    Data/Imaging Review: Reviewed in Epic and personally interpreted on 02/02/2023. See EMR for detailed results.

## 2023-02-03 LAB — HEPATIC FUNCTION PANEL
ALBUMIN: 3 g/dL — ABNORMAL LOW (ref 3.4–5.0)
ALKALINE PHOSPHATASE: 57 U/L (ref 46–116)
ALT (SGPT): <7 U/L — ABNORMAL LOW (ref 10–49)
AST (SGOT): 11 U/L (ref <=34)
BILIRUBIN DIRECT: <0.1 mg/dL (ref 0.00–0.30)
BILIRUBIN TOTAL: 0.2 mg/dL — ABNORMAL LOW (ref 0.3–1.2)
PROTEIN TOTAL: 5.8 g/dL (ref 5.7–8.2)

## 2023-02-03 LAB — CBC W/ AUTO DIFF
BASOPHILS ABSOLUTE COUNT: 0 10*9/L (ref 0.0–0.1)
BASOPHILS ABSOLUTE COUNT: 0 10*9/L (ref 0.0–0.1)
BASOPHILS RELATIVE PERCENT: 0.6 %
BASOPHILS RELATIVE PERCENT: 0.5 %
EOSINOPHILS ABSOLUTE COUNT: 0.1 10*9/L (ref 0.0–0.5)
EOSINOPHILS ABSOLUTE COUNT: 0.1 10*9/L (ref 0.0–0.5)
EOSINOPHILS RELATIVE PERCENT: 1.7 %
EOSINOPHILS RELATIVE PERCENT: 1.8 %
HEMATOCRIT: 22.4 % — ABNORMAL LOW (ref 34.0–44.0)
HEMATOCRIT: 22.9 % — ABNORMAL LOW (ref 34.0–44.0)
HEMOGLOBIN: 7.7 g/dL — ABNORMAL LOW (ref 11.3–14.9)
HEMOGLOBIN: 7.7 g/dL — ABNORMAL LOW (ref 11.3–14.9)
LYMPHOCYTES ABSOLUTE COUNT: 1 10*9/L — ABNORMAL LOW (ref 1.1–3.6)
LYMPHOCYTES ABSOLUTE COUNT: 1 10*9/L — ABNORMAL LOW (ref 1.1–3.6)
LYMPHOCYTES RELATIVE PERCENT: 26.4 %
LYMPHOCYTES RELATIVE PERCENT: 26.1 %
MEAN CORPUSCULAR HEMOGLOBIN CONC: 34.4 g/dL (ref 32.0–36.0)
MEAN CORPUSCULAR HEMOGLOBIN CONC: 33.8 g/dL (ref 32.0–36.0)
MEAN CORPUSCULAR HEMOGLOBIN: 32.4 pg (ref 25.9–32.4)
MEAN CORPUSCULAR HEMOGLOBIN: 31.9 pg (ref 25.9–32.4)
MEAN CORPUSCULAR VOLUME: 94.3 fL (ref 77.6–95.7)
MEAN CORPUSCULAR VOLUME: 94.5 fL (ref 77.6–95.7)
MEAN PLATELET VOLUME: 8.2 fL (ref 6.8–10.7)
MEAN PLATELET VOLUME: 8.4 fL (ref 6.8–10.7)
MONOCYTES ABSOLUTE COUNT: 0.3 10*9/L (ref 0.3–0.8)
MONOCYTES ABSOLUTE COUNT: 0.3 10*9/L (ref 0.3–0.8)
MONOCYTES RELATIVE PERCENT: 7.8 %
MONOCYTES RELATIVE PERCENT: 8 %
NEUTROPHILS ABSOLUTE COUNT: 2.3 10*9/L (ref 1.8–7.8)
NEUTROPHILS ABSOLUTE COUNT: 2.4 10*9/L (ref 1.8–7.8)
NEUTROPHILS RELATIVE PERCENT: 63.5 %
NEUTROPHILS RELATIVE PERCENT: 63.6 %
PLATELET COUNT: 144 10*9/L — ABNORMAL LOW (ref 150–450)
PLATELET COUNT: 146 10*9/L — ABNORMAL LOW (ref 150–450)
RED BLOOD CELL COUNT: 2.37 10*12/L — ABNORMAL LOW (ref 3.95–5.13)
RED BLOOD CELL COUNT: 2.43 10*12/L — ABNORMAL LOW (ref 3.95–5.13)
RED CELL DISTRIBUTION WIDTH: 13.1 % (ref 12.2–15.2)
RED CELL DISTRIBUTION WIDTH: 13 % (ref 12.2–15.2)
WBC ADJUSTED: 3.6 10*9/L (ref 3.6–11.2)
WBC ADJUSTED: 3.8 10*9/L (ref 3.6–11.2)

## 2023-02-03 LAB — MAGNESIUM
MAGNESIUM: 2.1 mg/dL (ref 1.6–2.6)
MAGNESIUM: 1.8 mg/dL (ref 1.6–2.6)

## 2023-02-03 LAB — BASIC METABOLIC PANEL
ANION GAP: 5 mmol/L (ref 5–14)
ANION GAP: 6 mmol/L (ref 5–14)
BLOOD UREA NITROGEN: 22 mg/dL (ref 9–23)
BLOOD UREA NITROGEN: 25 mg/dL — ABNORMAL HIGH (ref 9–23)
BUN / CREAT RATIO: 15
BUN / CREAT RATIO: 17
CALCIUM: 9.1 mg/dL (ref 8.7–10.4)
CALCIUM: 8.7 mg/dL (ref 8.7–10.4)
CHLORIDE: 112 mmol/L — ABNORMAL HIGH (ref 98–107)
CHLORIDE: 111 mmol/L — ABNORMAL HIGH (ref 98–107)
CO2: 23 mmol/L (ref 20.0–31.0)
CO2: 22 mmol/L (ref 20.0–31.0)
CREATININE: 1.46 mg/dL — ABNORMAL HIGH
CREATININE: 1.46 mg/dL — ABNORMAL HIGH
EGFR CKD-EPI (2021) FEMALE: 38 mL/min/{1.73_m2} — ABNORMAL LOW (ref >=60)
EGFR CKD-EPI (2021) FEMALE: 38 mL/min/{1.73_m2} — ABNORMAL LOW (ref >=60)
GLUCOSE RANDOM: 110 mg/dL (ref 70–179)
GLUCOSE RANDOM: 136 mg/dL (ref 70–179)
POTASSIUM: 5.2 mmol/L — ABNORMAL HIGH (ref 3.4–4.8)
POTASSIUM: 5.1 mmol/L — ABNORMAL HIGH (ref 3.4–4.8)
SODIUM: 140 mmol/L (ref 135–145)
SODIUM: 139 mmol/L (ref 135–145)

## 2023-02-03 MED ADMIN — insulin lispro (HumaLOG) injection 0-20 Units: 0-20 [IU] | SUBCUTANEOUS | @ 22:00:00

## 2023-02-03 MED ADMIN — chlorthalidone (HYGROTON) tablet 25 mg: 25 mg | ORAL | @ 12:00:00 | Stop: 2023-02-03

## 2023-02-03 MED ADMIN — heparin (porcine) 5,000 unit/mL injection 5,000 Units: 5000 [IU] | SUBCUTANEOUS | @ 18:00:00

## 2023-02-03 MED ADMIN — carvedilol (COREG) tablet 25 mg: 25 mg | ORAL | @ 01:00:00

## 2023-02-03 MED ADMIN — ertapenem (INVANZ) 1 g in sodium chloride 0.9 % (NS) 100 mL IVPB-MBP: 1 g | INTRAVENOUS | @ 12:00:00 | Stop: 2023-02-03

## 2023-02-03 MED ADMIN — nystatin (MYCOSTATIN) oral suspension: 500000 [IU] | ORAL | @ 10:00:00 | Stop: 2023-02-04

## 2023-02-03 MED ADMIN — atorvastatin (LIPITOR) tablet 10 mg: 10 mg | ORAL | @ 12:00:00

## 2023-02-03 MED ADMIN — insulin lispro (HumaLOG) injection 0-20 Units: 0-20 [IU] | SUBCUTANEOUS | @ 01:00:00

## 2023-02-03 MED ADMIN — mycophenolate (CELLCEPT) capsule 500 mg: 500 mg | ORAL

## 2023-02-03 MED ADMIN — chlorthalidone (HYGROTON) tablet 25 mg: 25 mg | ORAL | @ 16:00:00 | Stop: 2023-02-03

## 2023-02-03 MED ADMIN — mycophenolate (CELLCEPT) capsule 500 mg: 500 mg | ORAL | @ 12:00:00

## 2023-02-03 MED ADMIN — hydrALAZINE (APRESOLINE) tablet 75 mg: 75 mg | ORAL | @ 10:00:00

## 2023-02-03 MED ADMIN — heparin (porcine) 5,000 unit/mL injection 5,000 Units: 5000 [IU] | SUBCUTANEOUS | @ 01:00:00

## 2023-02-03 MED ADMIN — heparin (porcine) 5,000 unit/mL injection 5,000 Units: 5000 [IU] | SUBCUTANEOUS | @ 10:00:00

## 2023-02-03 MED ADMIN — levothyroxine (SYNTHROID) tablet 112 mcg: 112 ug | ORAL | @ 11:00:00

## 2023-02-03 MED ADMIN — nystatin (MYCOSTATIN) oral suspension: 500000 [IU] | ORAL | @ 18:00:00 | Stop: 2023-02-04

## 2023-02-03 MED ADMIN — insulin glargine (LANTUS) injection 4 Units: 4 [IU] | SUBCUTANEOUS | @ 01:00:00

## 2023-02-03 MED ADMIN — insulin lispro (HumaLOG) injection 0-20 Units: 0-20 [IU] | SUBCUTANEOUS | @ 16:00:00

## 2023-02-03 MED ADMIN — predniSONE (DELTASONE) tablet 5 mg: 5 mg | ORAL | @ 12:00:00

## 2023-02-03 MED ADMIN — amlodipine (NORVASC) tablet 10 mg: 10 mg | ORAL | @ 12:00:00

## 2023-02-03 MED ADMIN — hydrALAZINE (APRESOLINE) tablet 75 mg: 75 mg | ORAL | @ 18:00:00

## 2023-02-03 MED ADMIN — tacrolimus (PROGRAF) capsule 1 mg: 1 mg | ORAL

## 2023-02-03 MED ADMIN — cholecalciferol (vitamin D3 25 mcg (1,000 units)) tablet 50 mcg: 50 ug | ORAL | @ 12:00:00

## 2023-02-03 MED ADMIN — hydrALAZINE (APRESOLINE) tablet 75 mg: 75 mg | ORAL | @ 01:00:00

## 2023-02-03 MED ADMIN — tacrolimus (PROGRAF) capsule 1 mg: 1 mg | ORAL | @ 12:00:00

## 2023-02-03 MED ADMIN — carvedilol (COREG) tablet 25 mg: 25 mg | ORAL | @ 12:00:00

## 2023-02-03 NOTE — Unmapped (Signed)
Infectious Disease (MEDK) Progress Note    Assessment & Plan:   Julie [name] is a 72 y.o. 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     Principal Problem (Resolved):    Sepsis with encephalopathy without septic shock (CMS-HCC)  Active Problems:    Type 2 diabetes mellitus with diabetic nephropathy, with long-term current use of insulin (CMS-HCC)    End-stage renal disease (CMS-HCC)    Kidney transplant recipient    Anemia due to stage 5 chronic kidney disease, not on chronic dialysis (CMS-HCC)    Immunosuppressed status (CMS-HCC)    Hypertension    Hyperlipidemia    Chronic idiopathic thrombocytopenia (CMS-HCC)    Acute metabolic encephalopathy    AKI (acute kidney injury) (CMS-HCC)    Acute metabolic acidosis        Active Problems     Transplant pyelonephritis   7/6 P/w encephalopathy, nausea/vomiting necesitating intubation for airway protection initially. 7/7 extubated. UA w/ Lg LE, nitrites positive, sm blood, >182 WBC <1 Sq. Ucx grew <1000 cfu but on plates appeared as E coli). No hydronephrosis. Similar inflammatory stranding surrounding the transplant kidney. CT AP w/ bibasilar consolidatiosn c/w aspiration. 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 VIR c/s and collection not large enough to aspirate.   - Rx 7/6 vanc/cefepime -> 7/7 Vanc, mero--> 7/8 meropenem--> 7/9 Ertapenem   - cont Ertapenem IV 500mg  daily, 6-8 weeks, page ICID to confirm f/u if not done before d/c  - Lab monitoring: weekly CBC with differential, CMP     AKI on DDKT  08/06/22 2/2 Type 2 diabetes mellitus.  AKI on admission. Follows with Dr. Elvera Maria. Baseline cr 0.9-1.1. Peaked at 1.97 shortly after adm, improved w/ fluids c/w low effective circulatory volume AKI (vs less likely sepsis-ATN based on trend), s/p 500 cc 7/12.  -Transplant nephrology following  - Immunosuppression: Tacrolimus (goal 5-7), MMF 500 BID (reduced 250mg  bid on 7/6) Pred 5  -MWF tacrolimus levels and pharmacy consult to assist with dosing  - Prophylaxis: valganciclovir x 3 months (mod risk, completed), TMP+SMX x 6 months course completed through -7/14    Hyperkalemia  BID lytes, discontinued spironolactone. Last dose bactrim 7/12.  Received Lokelma 10 g and shifted 7/12  - Lokelma x1 for potassium 5.5 or greater  -Obtain ECG and shift for potassium 6.0 or greater  -Continue restarting spironolactone versus alternative antihypertensive based on current    Question of thrush? No recent documentation-nystatin oral suspension was ordered and set to expire in 1 day, discuss with patient    HTN  -Continue amlodipine 10 mg daily  -Continue home carvedilol 25 mg twice daily  -Continue home chlorthalidone 25 mg daily  -Continue home hydralazine, increased this hospitalization to 75 mg daily, consider switching to a different agent not 3 times daily prior to discharge vs increasing chlorthalidone or resuming spiro 25 if K improved     T2DM-added long-acting glargine recently due to severe hyperglycemia, along with SSI    Acute on chronic anemia-hemoglobin 7.0, 6/24 ferritin 1600 and TSAT 51; most likely secondary to anemia of chronic kidney disease and anemia of inflammation  -Limit blood draws      Issues Impacting Complexity of  Management:  -The patient is at high risk from Hospital immobility in an elderly patient given baseline poor functional status with a high risk of causing delirium and further decline in function      Medical Decision Making: Reviewed records from the following unique sources consult notes from VIR, transplant nephrology and IC ID.      Daily Checklist:  Diet: Regular Diet  DVT PPx: Heparin 5000units q8h  Electrolytes: Replete Potassium to >/= 3.6 and Magnesium to >/= 1.8  Code Status: Full Code  Dispo: Transfer to Floor    Team Contact Information:   Primary Team: Infectious Disease (MEDK)  Primary Resident: Mel Almond, MD, MD  Resident's Pager: [phone] [facility] Disease Senior Resident)    Interval History:   No acute events overnight. Tolerating PO well without any nausea or vomiting.  Denies any concerns today.  No pain anywhere or dysuria, urgency or suprapubic pain.    She would like to discharge from the hospital soon.  Frustrated at how frequently she has been in the hospital since having her kidney transplant and wants to know how she can prevent this happening again  ROS: Denies headache, chest pain, shortness of breath, abdominal pain, nausea, vomiting.    Objective:   Temp:  [36.6 ??C (97.9 ??F)-37.1 ??C (98.8 ??F)] 36.8 ??C (98.2 ??F)  Heart Rate:  [59-64] 63  SpO2 Pulse:  [59-64] 64  Resp:  [15-20] 16  BP: (145-179)/(31-50) 177/50  SpO2:  [96 %-100 %] 100 %    Gen: NAD, converses   HENT: atraumatic, normocephalic  Heart: RRR, blowing systolic murmur better heard at the apex  Lungs: CTAB, no crackles or wheezes  Abdomen: soft, NTND  Extremities: No edema, +1 pitting edema 75% up legs b/l, symmetric calves

## 2023-02-03 NOTE — Unmapped (Signed)
Infectious Disease (MEDK) Progress Note    Assessment & Plan:   Julie [name] is a 72 y.o. 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        Principal Problem (Resolved):    Sepsis with encephalopathy without septic shock (CMS-HCC)  Active Problems:    Type 2 diabetes mellitus with diabetic nephropathy, with long-term current use of insulin (CMS-HCC)    End-stage renal disease (CMS-HCC)    Kidney transplant recipient    Anemia due to stage 5 chronic kidney disease, not on chronic dialysis (CMS-HCC)    Immunosuppressed status (CMS-HCC)    Hypertension    Hyperlipidemia    Chronic idiopathic thrombocytopenia (CMS-HCC)    Acute metabolic encephalopathy    AKI (acute kidney injury) (CMS-HCC)    Acute metabolic acidosis      Active Problems     Transplant pyelonephritis  7/6 P/w encephalopathy, nausea/vomiting necesitating intubation for airway protection initially. 7/7 extubated. UA w/ Lg LE, nitrites positive, sm blood, >182 WBC <1 Sq. Ucx grew <1000 cfu but on plates appeared as E coli). No hydronephrosis. Similar inflammatory stranding surrounding the transplant kidney. CT AP w/ bibasilar consolidatiosn c/w aspiration. 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 VIR c/s and collection not large enough to aspirate.  - Rx 7/6 vanc/cefepime -> 7/7 Vanc, mero--> 7/8 meropenem--> 7/9 Ertapenem   - Transitioning to Ertapenem IV 500mg  daily on 7/14, 6-8 weeks, page ICID to confirm f/u if not done before d/c  - Lab monitoring: weekly CBC with differential, CMP   - Blood and urine cultures have been negative.  - IR tunneled catheter insertion scheduled for 7/15-7/17     AKI on DDKT  08/06/22 2/2 Type 2 diabetes mellitus.  AKI on admission. Follows with Dr. Elvera Maria. Baseline cr 0.9-1.1. Peaked at 1.97 shortly after adm, improved w/ fluids c/w low effective circulatory volume AKI (vs less likely sepsis-ATN based on trend), s/p 500 cc 7/12.  - Transplant nephrology following  - Immunosuppression: Tacrolimus (goal 5-7), MMF 500 BID (reduced 250mg  bid on 7/6) Pred 5  - MWF tacrolimus levels and pharmacy consult to assist with dosing  - Prophylaxis: valganciclovir x 3 months (mod risk, completed), TMP+SMX x 6 months course completed through -7/14  - Cr 1.46 today     Hyperkalemia  BID lytes, discontinued spironolactone. Last dose bactrim 7/12.  Received Lokelma 10 g and shifted 7/12  -5.1 today, trending down  -Lokelma x1 for potassium 5.5 or greater  -Obtain ECG and shift for potassium 6.0 or greater  -Continue restarting spironolactone versus alternative antihypertensive based on current     HTN  -Continue amlodipine 10 mg daily  -Continue home carvedilol 25 mg twice daily  -Chlorthalidone increased to 50 mg daily d/t elevated BP  -Continue home hydralazine, increased this hospitalization to 75 mg daily, consider switching to a different agent not 3 times daily prior to discharge vs increasing chlorthalidone or resuming spiro 25 if K improved      T2DM  - Continue long-acting glargine and SSI  - Glucose 136     Acute on chronic anemia  -Hemoglobin 7.0, 6/24 ferritin 1600 and TSAT 51; most likely secondary to anemia of chronic  kidney disease and anemia of inflammation  - Limit blood draws      Issues Impacting Complexity of Management:  The patient is at high risk from Hospital immobility in an elderly patient given baseline poor functional status with a high risk of causing delirium and further decline in function        Medical Decision Making: Reviewed records from the following unique sources consult notes from VIR, transplant nephrology and IC ID.      Daily Checklist:  Diet: Regular Diet  DVT PPx: Heparin 5000units q8h  Electrolytes: Replete Potassium to >/= 3.6 and Magnesium to >/= 1.8  Code Status: Full Code  Dispo: Transfer to Floor    Team Contact Information:   Primary Team: Infectious Disease (MEDK)  Primary Resident: Seward Grater, MD  Resident's Pager: [phone] (Infect Disease Intern - Tower)    Interval History:   No acute events overnight. Tolerating PO well without any nausea or vomiting.  Denies any concerns today.  No pain anywhere or dysuria, urgency or suprapubic pain.     She would like to discharge from the hospital soon.  Frustrated at how frequently she has been in the hospital since having her kidney transplant and wants to know how she can prevent this happening again  ROS: Denies headache, chest pain, shortness of breath, abdominal pain, nausea, vomiting.    Objective:     Gen: NAD, converses   HENT: atraumatic, normocephalic  Heart: RRR  Lungs: CTAB, no crackles or wheezes  Abdomen: soft, NTND  Extremities: No edema       Jenita Seashore, DO  PGY-1 Internal Medicine

## 2023-02-03 NOTE — Unmapped (Signed)
Patient tolerated all medications and treatments well. Alert, oriented, afebrile, denies pain, denies shortness of breath. Patient has remained free from falls and injury this shift. Continuing with plan of care.      Problem: Adult Inpatient Plan of Care  Goal: Plan of Care Review  Outcome: Ongoing - Unchanged  Goal: Patient-Specific Goal (Individualized)  Outcome: Ongoing - Unchanged  Goal: Absence of Hospital-Acquired Illness or Injury  Outcome: Ongoing - Unchanged  Intervention: Identify and Manage Fall Risk  Recent Flowsheet Documentation  Taken 02/02/2023 2200 by Wayland Salinas, RN  Safety Interventions:   fall reduction program maintained   family at bedside   low bed   lighting adjusted for tasks/safety   nonskid shoes/slippers when out of bed  Goal: Optimal Comfort and Wellbeing  Outcome: Ongoing - Unchanged  Goal: Readiness for Transition of Care  Outcome: Ongoing - Unchanged  Goal: Rounds/Family Conference  Outcome: Ongoing - Unchanged     Problem: Fall Injury Risk  Goal: Absence of Fall and Fall-Related Injury  Outcome: Ongoing - Unchanged  Intervention: Promote Injury-Free Environment  Recent Flowsheet Documentation  Taken 02/02/2023 2200 by Wayland Salinas, RN  Safety Interventions:   fall reduction program maintained   family at bedside   low bed   lighting adjusted for tasks/safety   nonskid shoes/slippers when out of bed     Problem: Self-Care Deficit  Goal: Improved Ability to Complete Activities of Daily Living  Outcome: Ongoing - Unchanged     Problem: Skin Injury Risk Increased  Goal: Skin Health and Integrity  Outcome: Ongoing - Unchanged  Intervention: Optimize Skin Protection  Recent Flowsheet Documentation  Taken 02/02/2023 2200 by Wayland Salinas, RN  Activity Management: ambulated in room  Head of Bed [facility]) Positioning: HOB elevated

## 2023-02-03 NOTE — Unmapped (Signed)
Aox4; denies pain. RA. NSR; afebrile. Voids to BSC. 1 BM this shift. CHG bath declined; pt completed oral care independently. Ambulated with RN. Family at bedside. See [facility] & flowsheets for more.      Problem: Adult Inpatient Plan of Care  Goal: Plan of Care Review  Outcome: Ongoing - Unchanged  Goal: Patient-Specific Goal (Individualized)  Outcome: Ongoing - Unchanged  Goal: Absence of Hospital-Acquired Illness or Injury  Outcome: Ongoing - Unchanged  Intervention: Identify and Manage Fall Risk  Recent Flowsheet Documentation  Taken 02/02/2023 0800 by Ardith Dark, Barbie Haggis, RN  Safety Interventions:   aspiration precautions   bleeding precautions   fall reduction program maintained   low bed   infection management  Intervention: Prevent Skin Injury  Recent Flowsheet Documentation  Taken 02/02/2023 1800 by Ardith Dark, Barbie Haggis, RN  Positioning for Skin: Sitting in Chair  Taken 02/02/2023 1600 by Ardith Dark, Barbie Haggis, RN  Positioning for Skin: Sitting in Chair  Taken 02/02/2023 1400 by Ardith Dark, Barbie Haggis, RN  Positioning for Skin: Sitting in Chair  Taken 02/02/2023 1200 by Ardith Dark, Barbie Haggis, RN  Positioning for Skin: Sitting in Chair  Taken 02/02/2023 1000 by Ardith Dark, Barbie Haggis, RN  Positioning for Skin: Sitting in Chair  Taken 02/02/2023 0800 by Ardith Dark, Barbie Haggis, RN  Positioning for Skin: Sitting in Chair  Device Skin Pressure Protection:   absorbent pad utilized/changed   adhesive use limited  Skin Protection:   adhesive use limited   cleansing with dimethicone incontinence wipes   incontinence pads utilized  Intervention: Prevent Infection  Recent Flowsheet Documentation  Taken 02/02/2023 0800 by Ardith Dark, Barbie Haggis, RN  Infection Prevention: hand hygiene promoted  Goal: Optimal Comfort and Wellbeing  Outcome: Ongoing - Unchanged  Goal: Readiness for Transition of Care  Outcome: Ongoing - Unchanged  Goal: Rounds/Family Conference  Outcome: Ongoing - Unchanged     Problem: Fall Injury Risk  Goal: Absence of Fall and Fall-Related Injury  Outcome: Ongoing - Unchanged  Intervention: Promote Injury-Free Environment  Recent Flowsheet Documentation  Taken 02/02/2023 0800 by Ardith Dark, Barbie Haggis, RN  Safety Interventions:   aspiration precautions   bleeding precautions   fall reduction program maintained   low bed   infection management     Problem: Self-Care Deficit  Goal: Improved Ability to Complete Activities of Daily Living  Outcome: Ongoing - Unchanged     Problem: Skin Injury Risk Increased  Goal: Skin Health and Integrity  Outcome: Ongoing - Unchanged  Intervention: Optimize Skin Protection  Recent Flowsheet Documentation  Taken 02/02/2023 1730 by Ardith Dark, Barbie Haggis, RN  Activity Management: ambulated outside room  Taken 02/02/2023 1600 by Ardith Dark, Barbie Haggis, RN  Head of Bed [facility]) Positioning: HOB at 30-45 degrees  Taken 02/02/2023 1200 by Ardith Dark, Barbie Haggis, RN  Head of Bed [facility]) Positioning: HOB at 30-45 degrees  Taken 02/02/2023 0800 by Ardith Dark, Barbie Haggis, RN  Activity Management: ambulated in room  Pressure Reduction Techniques: frequent weight shift encouraged  Head of Bed (HOB) Positioning: HOB at 30-45 degrees  Pressure Reduction Devices: pressure-redistributing mattress utilized  Skin Protection:   adhesive use limited   cleansing with dimethicone incontinence wipes   incontinence pads utilized

## 2023-02-04 LAB — CBC W/ AUTO DIFF
BASOPHILS ABSOLUTE COUNT: 0 10*9/L (ref 0.0–0.1)
BASOPHILS RELATIVE PERCENT: 0.6 %
EOSINOPHILS ABSOLUTE COUNT: 0.1 10*9/L (ref 0.0–0.5)
EOSINOPHILS RELATIVE PERCENT: 2.6 %
HEMATOCRIT: 23.6 % — ABNORMAL LOW (ref 34.0–44.0)
HEMOGLOBIN: 8.1 g/dL — ABNORMAL LOW (ref 11.3–14.9)
LYMPHOCYTES ABSOLUTE COUNT: 1 10*9/L — ABNORMAL LOW (ref 1.1–3.6)
LYMPHOCYTES RELATIVE PERCENT: 30.2 %
MEAN CORPUSCULAR HEMOGLOBIN CONC: 34.4 g/dL (ref 32.0–36.0)
MEAN CORPUSCULAR HEMOGLOBIN: 32.5 pg — ABNORMAL HIGH (ref 25.9–32.4)
MEAN CORPUSCULAR VOLUME: 94.4 fL (ref 77.6–95.7)
MEAN PLATELET VOLUME: 7.6 fL (ref 6.8–10.7)
MONOCYTES ABSOLUTE COUNT: 0.3 10*9/L (ref 0.3–0.8)
MONOCYTES RELATIVE PERCENT: 7.9 %
NEUTROPHILS ABSOLUTE COUNT: 2 10*9/L (ref 1.8–7.8)
NEUTROPHILS RELATIVE PERCENT: 58.7 %
PLATELET COUNT: 174 10*9/L (ref 150–450)
RED BLOOD CELL COUNT: 2.5 10*12/L — ABNORMAL LOW (ref 3.95–5.13)
RED CELL DISTRIBUTION WIDTH: 13.3 % (ref 12.2–15.2)
WBC ADJUSTED: 3.4 10*9/L — ABNORMAL LOW (ref 3.6–11.2)

## 2023-02-04 LAB — BASIC METABOLIC PANEL
ANION GAP: 6 mmol/L (ref 5–14)
BLOOD UREA NITROGEN: 23 mg/dL (ref 9–23)
BUN / CREAT RATIO: 15
CALCIUM: 8.9 mg/dL (ref 8.7–10.4)
CHLORIDE: 116 mmol/L — ABNORMAL HIGH (ref 98–107)
CO2: 23 mmol/L (ref 20.0–31.0)
CREATININE: 1.49 mg/dL — ABNORMAL HIGH
EGFR CKD-EPI (2021) FEMALE: 37 mL/min/{1.73_m2} — ABNORMAL LOW (ref >=60)
GLUCOSE RANDOM: 107 mg/dL (ref 70–179)
POTASSIUM: 5.3 mmol/L — ABNORMAL HIGH (ref 3.4–4.8)
SODIUM: 145 mmol/L (ref 135–145)

## 2023-02-04 LAB — MAGNESIUM: MAGNESIUM: 1.8 mg/dL (ref 1.6–2.6)

## 2023-02-04 MED ADMIN — predniSONE (DELTASONE) tablet 5 mg: 5 mg | ORAL | @ 12:00:00 | NDC 55289085902

## 2023-02-04 MED ADMIN — mycophenolate (CELLCEPT) capsule 500 mg: 500 mg | ORAL | @ 12:00:00 | NDC 82804003330

## 2023-02-04 MED ADMIN — levothyroxine (SYNTHROID) tablet 112 mcg: 112 ug | ORAL | @ 10:00:00 | NDC 72865024190

## 2023-02-04 MED ADMIN — ertapenem (INVANZ) 500 mg in sodium chloride (NS) 0.9 % 50 mL IVPB: 500 mg | INTRAVENOUS | @ 12:00:00 | Stop: 2023-03-06

## 2023-02-04 MED ADMIN — hydrALAZINE (APRESOLINE) tablet 75 mg: 75 mg | ORAL | @ 18:00:00 | NDC 72572026525

## 2023-02-04 MED ADMIN — carvedilol (COREG) tablet 25 mg: 25 mg | ORAL | @ 02:00:00

## 2023-02-04 MED ADMIN — mycophenolate (CELLCEPT) capsule 500 mg: 500 mg | ORAL | @ 02:00:00

## 2023-02-04 MED ADMIN — nystatin (MYCOSTATIN) oral suspension: 500000 [IU] | ORAL | @ 10:00:00 | Stop: 2023-02-04 | NDC 53191019210

## 2023-02-04 MED ADMIN — hydrALAZINE (APRESOLINE) tablet 75 mg: 75 mg | ORAL | @ 02:00:00

## 2023-02-04 MED ADMIN — insulin lispro (HumaLOG) injection 0-20 Units: 0-20 [IU] | SUBCUTANEOUS | @ 02:00:00

## 2023-02-04 MED ADMIN — hydrALAZINE (APRESOLINE) tablet 75 mg: 75 mg | ORAL | @ 10:00:00 | NDC 72572026525

## 2023-02-04 MED ADMIN — tacrolimus (PROGRAF) capsule 1 mg: 1 mg | ORAL | @ 12:00:00 | NDC 82804003230

## 2023-02-04 MED ADMIN — heparin (porcine) 5,000 unit/mL injection 5,000 Units: 5000 [IU] | SUBCUTANEOUS | @ 18:00:00 | NDC 90011030005

## 2023-02-04 MED ADMIN — cholecalciferol (vitamin D3 25 mcg (1,000 units)) tablet 50 mcg: 50 ug | ORAL | @ 12:00:00 | NDC 53191040901

## 2023-02-04 MED ADMIN — insulin glargine (LANTUS) injection 4 Units: 4 [IU] | SUBCUTANEOUS | @ 02:00:00

## 2023-02-04 MED ADMIN — amlodipine (NORVASC) tablet 10 mg: 10 mg | ORAL | @ 12:00:00 | NDC 82009002810

## 2023-02-04 MED ADMIN — tacrolimus (PROGRAF) capsule 1 mg: 1 mg | ORAL | @ 02:00:00

## 2023-02-04 MED ADMIN — heparin (porcine) 5,000 unit/mL injection 5,000 Units: 5000 [IU] | SUBCUTANEOUS | @ 02:00:00

## 2023-02-04 MED ADMIN — nystatin (MYCOSTATIN) oral suspension: 500000 [IU] | ORAL | @ 20:00:00 | Stop: 2023-02-04 | NDC 53191019210

## 2023-02-04 MED ADMIN — atorvastatin (LIPITOR) tablet 10 mg: 10 mg | ORAL | @ 12:00:00 | NDC 82009000190

## 2023-02-04 MED ADMIN — insulin lispro (HumaLOG) injection 0-20 Units: 0-20 [IU] | SUBCUTANEOUS | @ 16:00:00 | NDC 68115074610

## 2023-02-04 MED ADMIN — heparin (porcine) 5,000 unit/mL injection 5,000 Units: 5000 [IU] | SUBCUTANEOUS | @ 10:00:00 | NDC 90011030005

## 2023-02-04 MED ADMIN — carvedilol (COREG) tablet 25 mg: 25 mg | ORAL | @ 12:00:00 | NDC 82009012805

## 2023-02-04 MED ADMIN — chlorthalidone (HYGROTON) tablet 50 mg: 50 mg | ORAL | @ 12:00:00 | NDC 75834011010

## 2023-02-04 MED ADMIN — insulin lispro (HumaLOG) injection 0-20 Units: 0-20 [IU] | SUBCUTANEOUS | @ 21:00:00 | NDC 68115074610

## 2023-02-04 NOTE — Unmapped (Addendum)
Patient alert and oriented x4, denies any pain,remained afebrile this shift, IV antibiotics given as scheduled , no adverse reactions noted. Will continue to monitor.  Problem: Adult Inpatient Plan of Care  Goal: Plan of Care Review  Outcome: Progressing  Flowsheets (Taken 02/03/2023 1813)  Progress: improving  Plan of Care Reviewed With: patient  Goal: Patient-Specific Goal (Individualized)  Outcome: Progressing  Flowsheets (Taken 02/03/2023 1813)  Patient/Family-Specific Goals (Include Timeframe): tunneled line insertion on monday 7/16  Goal: Absence of Hospital-Acquired Illness or Injury  Outcome: Progressing  Intervention: Identify and Manage Fall Risk  Recent Flowsheet Documentation  Taken 02/03/2023 0800 by Valetta Fuller, RN  Safety Interventions:   fall reduction program maintained   family at bedside   low bed   nonskid shoes/slippers when out of bed  Goal: Optimal Comfort and Wellbeing  Outcome: Progressing  Goal: Readiness for Transition of Care  Outcome: Progressing  Goal: Rounds/Family Conference  Outcome: Progressing     Problem: Fall Injury Risk  Goal: Absence of Fall and Fall-Related Injury  Outcome: Progressing  Intervention: Promote Scientist, clinical (histocompatibility and immunogenetics) Documentation  Taken 02/03/2023 0800 by Valetta Fuller, RN  Safety Interventions:   fall reduction program maintained   family at bedside   low bed   nonskid shoes/slippers when out of bed     Problem: Self-Care Deficit  Goal: Improved Ability to Complete Activities of Daily Living  Outcome: Progressing     Problem: Skin Injury Risk Increased  Goal: Skin Health and Integrity  Outcome: Progressing  Intervention: Optimize Skin Protection  Recent Flowsheet Documentation  Taken 02/03/2023 0800 by Valetta Fuller, RN  Head of Bed [facility]) Positioning: HOB elevated     Problem: Infection  Goal: Absence of Infection Signs and Symptoms  Outcome: Progressing

## 2023-02-04 NOTE — Unmapped (Signed)
No significant changes or events overnight. Patient tolerated all medications and treatments well. Patient has remained free from falls and injury this shift. Continuing with plan of care.      Problem: Adult Inpatient Plan of Care  Goal: Plan of Care Review  Outcome: Ongoing - Unchanged  Goal: Patient-Specific Goal (Individualized)  Outcome: Ongoing - Unchanged  Goal: Absence of Hospital-Acquired Illness or Injury  Outcome: Ongoing - Unchanged  Intervention: Identify and Manage Fall Risk  Recent Flowsheet Documentation  Taken 02/03/2023 2000 by Wayland Salinas, RN  Safety Interventions:   fall reduction program maintained   family at bedside   low bed   lighting adjusted for tasks/safety   nonskid shoes/slippers when out of bed  Goal: Optimal Comfort and Wellbeing  Outcome: Ongoing - Unchanged  Goal: Readiness for Transition of Care  Outcome: Ongoing - Unchanged  Goal: Rounds/Family Conference  Outcome: Ongoing - Unchanged     Problem: Fall Injury Risk  Goal: Absence of Fall and Fall-Related Injury  Outcome: Ongoing - Unchanged  Intervention: Promote Injury-Free Environment  Recent Flowsheet Documentation  Taken 02/03/2023 2000 by Wayland Salinas, RN  Safety Interventions:   fall reduction program maintained   family at bedside   low bed   lighting adjusted for tasks/safety   nonskid shoes/slippers when out of bed     Problem: Self-Care Deficit  Goal: Improved Ability to Complete Activities of Daily Living  Outcome: Ongoing - Unchanged     Problem: Skin Injury Risk Increased  Goal: Skin Health and Integrity  Outcome: Ongoing - Unchanged  Intervention: Optimize Skin Protection  Recent Flowsheet Documentation  Taken 02/03/2023 2000 by Wayland Salinas, RN  Activity Management: ambulated in room  Head of Bed [facility]) Positioning: HOB elevated     Problem: Infection  Goal: Absence of Infection Signs and Symptoms  Outcome: Ongoing - Unchanged

## 2023-02-04 NOTE — Unmapped (Signed)
Infectious Disease (MEDK) Progress Note    Assessment & Plan:   Julie [name] is a 72 y.o. 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 Has a fluid collection around transplant kidney with ICID planning 6 week IV antibiotic treatment.      Principal Problem (Resolved):    Sepsis with encephalopathy without septic shock (CMS-HCC)  Active Problems:    Type 2 diabetes mellitus with diabetic nephropathy, with long-term current use of insulin (CMS-HCC)    End-stage renal disease (CMS-HCC)    Kidney transplant recipient    Anemia due to stage 5 chronic kidney disease, not on chronic dialysis (CMS-HCC)    Immunosuppressed status (CMS-HCC)    Hypertension    Hyperlipidemia    Chronic idiopathic thrombocytopenia (CMS-HCC)    Acute metabolic encephalopathy    AKI (acute kidney injury) (CMS-HCC)    Acute metabolic acidosis      Active Problems     Transplant pyelonephritis  7/6 P/w encephalopathy, nausea/vomiting necesitating intubation for airway protection initially. 7/7 extubated. UA w/ Lg LE, nitrites positive, sm blood, >182 WBC <1 Sq. Ucx grew <1000 cfu but on plates appeared as E coli). No hydronephrosis. Similar inflammatory stranding surrounding the transplant kidney. CT AP w/ bibasilar consolidatiosn c/w aspiration. 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 VIR c/s and collection not large enough to aspirate.  - Rx 7/6 vanc/cefepime -> 7/7 Vanc, mero--> 7/8 meropenem--> 7/9 Ertapenem   - Switched to Ertapenem IV 500mg  daily on 7/14, 6-8 weeks, page ICID to confirm f/u if not done before d/c  - Lab monitoring: weekly CBC with differential, CMP   - Blood and urine cultures have been negative.  - IR tunneled catheter insertion scheduled for 7/15-7/17  - NPO midnight, 7/14     AKI on DDKT  08/06/22 2/2 Type 2 diabetes mellitus.  AKI on admission. Follows with Dr. Elvera Maria. Baseline cr 0.9-1.1. Peaked at 1.97 shortly after adm, improved w/ fluids c/w low effective circulatory volume AKI (vs less likely sepsis-ATN based on trend), s/p 500 cc 7/12.  - Transplant nephrology following  - Immunosuppression: Tacrolimus (goal 5-7), cellcept 500 BID, Pred 5  - MWF tacrolimus levels and pharmacy consult to assist with dosing  - Prophylaxis: valganciclovir x 3 months (mod risk, completed), TMP+SMX x 6 months course completed through -7/14  - Cr 1.49 today, monitoring     Hyperkalemia  BID lytes, discontinued spironolactone. Last dose bactrim 7/12.  Received Lokelma 10 g and shifted 7/12  -5.3 today, now trending up, monitoring  -Lokelma x1 for potassium 5.5 or greater  -Obtain ECG and shift for potassium 6.0 or greater  -Continue restarting spironolactone versus alternative antihypertensive based on current    HTN  Today 149/88, monitor chlorthalidone reponse  -Continue amlodipine 10 mg daily  -Continue home carvedilol 25 mg twice daily  -Chlorthalidone increased to 50 mg daily d/t elevated BP  -Continue home hydralazine, increased this hospitalization to 75 mg daily, consider switching to a different agent not 3 times daily prior to discharge vs increasing chlorthalidone or resuming spiro 25 if K improved      T2DM  - Continue long-acting glargine and SSI  - Glucose 107  Acute on chronic anemia  - Most likely secondary to anemia of chronic kidney disease and anemia of inflammation  - Hgb today 8.1  - Limit blood draws      Issues Impacting Complexity of Management:  The patient is at high risk from Hospital immobility in an elderly patient given baseline poor functional status with a high risk of causing delirium and further decline in function Medical Decision Making: Reviewed records from the following unique sources consult notes from VIR, transplant nephrology and IC ID.      Daily Checklist:  Diet: Regular Diet  DVT PPx: Heparin 5000units q8h  Electrolytes: Replete Potassium to >/= 3.6 and Magnesium to >/= 1.8  Code Status: Full Code  Dispo: Transfer to Floor    Team Contact Information:   Primary Team: Infectious Disease (MEDK)  Primary Resident: Quillian Quince, DO  Resident's Pager: [phone] (Infect Disease Intern - Tower)    Interval History:   No acute events overnight. Tolerating PO well without any nausea or vomiting.  Denies any concerns today.  No pain anywhere or dysuria, urgency or suprapubic pain. Expresses desire to go home asap.      Objective:     Gen: NAD, A&Ox4  HENT: atraumatic, normocephalic  Heart: RRR  Lungs: CTAB, no crackles or wheezes  Abdomen: soft, NTND  Extremities: No edema       Jenita Seashore, DO  PGY-1 Internal Medicine

## 2023-02-05 LAB — CBC W/ AUTO DIFF
BASOPHILS ABSOLUTE COUNT: 0 10*9/L (ref 0.0–0.1)
BASOPHILS RELATIVE PERCENT: 0.5 %
EOSINOPHILS ABSOLUTE COUNT: 0.1 10*9/L (ref 0.0–0.5)
EOSINOPHILS RELATIVE PERCENT: 2.3 %
HEMATOCRIT: 24.6 % — ABNORMAL LOW (ref 34.0–44.0)
HEMOGLOBIN: 8.3 g/dL — ABNORMAL LOW (ref 11.3–14.9)
LYMPHOCYTES ABSOLUTE COUNT: 1 10*9/L — ABNORMAL LOW (ref 1.1–3.6)
LYMPHOCYTES RELATIVE PERCENT: 26.2 %
MEAN CORPUSCULAR HEMOGLOBIN CONC: 33.8 g/dL (ref 32.0–36.0)
MEAN CORPUSCULAR HEMOGLOBIN: 32.1 pg (ref 25.9–32.4)
MEAN CORPUSCULAR VOLUME: 94.9 fL (ref 77.6–95.7)
MEAN PLATELET VOLUME: 7.7 fL (ref 6.8–10.7)
MONOCYTES ABSOLUTE COUNT: 0.3 10*9/L (ref 0.3–0.8)
MONOCYTES RELATIVE PERCENT: 6.5 %
NEUTROPHILS ABSOLUTE COUNT: 2.6 10*9/L (ref 1.8–7.8)
NEUTROPHILS RELATIVE PERCENT: 64.5 %
PLATELET COUNT: 194 10*9/L (ref 150–450)
RED BLOOD CELL COUNT: 2.6 10*12/L — ABNORMAL LOW (ref 3.95–5.13)
RED CELL DISTRIBUTION WIDTH: 13.7 % (ref 12.2–15.2)
WBC ADJUSTED: 4 10*9/L (ref 3.6–11.2)

## 2023-02-05 LAB — HLA DS POST TRANSPLANT
ANTI-DONOR DRW #1 MFI: 188 MFI
ANTI-DONOR DRW #2 MFI: 188 MFI
ANTI-DONOR HLA-A #1 MFI: 9 MFI
ANTI-DONOR HLA-A #2 MFI: 8 MFI
ANTI-DONOR HLA-B #1 MFI: 1 MFI
ANTI-DONOR HLA-B #2 MFI: 0 MFI
ANTI-DONOR HLA-C #1 MFI: 0 MFI
ANTI-DONOR HLA-DP #2 MFI: 99 MFI
ANTI-DONOR HLA-DQB #1 MFI: 162 MFI
ANTI-DONOR HLA-DQB #2 MFI: 162 MFI
ANTI-DONOR HLA-DR #1 MFI: 27 MFI
ANTI-DONOR HLA-DR #2 MFI: 27 MFI

## 2023-02-05 LAB — FSAB CLASS 1 ANTIBODY SPECIFICITY: HLA CLASS 1 ANTIBODY RESULT: NEGATIVE

## 2023-02-05 LAB — FSAB CLASS 2 ANTIBODY SPECIFICITY: HLA CL2 AB RESULT: POSITIVE

## 2023-02-05 LAB — BASIC METABOLIC PANEL
ANION GAP: 6 mmol/L (ref 5–14)
BLOOD UREA NITROGEN: 25 mg/dL — ABNORMAL HIGH (ref 9–23)
BUN / CREAT RATIO: 18
CALCIUM: 9.3 mg/dL (ref 8.7–10.4)
CHLORIDE: 113 mmol/L — ABNORMAL HIGH (ref 98–107)
CO2: 22 mmol/L (ref 20.0–31.0)
CREATININE: 1.36 mg/dL — ABNORMAL HIGH
EGFR CKD-EPI (2021) FEMALE: 41 mL/min/{1.73_m2} — ABNORMAL LOW (ref >=60)
GLUCOSE RANDOM: 110 mg/dL (ref 70–179)
POTASSIUM: 5.2 mmol/L — ABNORMAL HIGH (ref 3.4–4.8)
SODIUM: 141 mmol/L (ref 135–145)

## 2023-02-05 LAB — PROTIME-INR
INR: 0.94
PROTIME: 10.6 s (ref 9.9–12.6)

## 2023-02-05 LAB — MAGNESIUM: MAGNESIUM: 1.7 mg/dL (ref 1.6–2.6)

## 2023-02-05 LAB — TACROLIMUS LEVEL, TROUGH: TACROLIMUS, TROUGH: 5.5 ng/mL (ref 5.0–15.0)

## 2023-02-05 MED ADMIN — insulin glargine (LANTUS) injection 4 Units: 4 [IU] | SUBCUTANEOUS | @ 01:00:00 | NDC 68115083910

## 2023-02-05 MED ADMIN — hydrALAZINE (APRESOLINE) tablet 100 mg: 100 mg | ORAL | @ 18:00:00 | NDC 77771018501

## 2023-02-05 MED ADMIN — tacrolimus (PROGRAF) capsule 1 mg: 1 mg | ORAL | @ 01:00:00 | NDC 82804003230

## 2023-02-05 MED ADMIN — carvedilol (COREG) tablet 25 mg: 25 mg | ORAL | @ 01:00:00 | NDC 82009012805

## 2023-02-05 MED ADMIN — carvedilol (COREG) tablet 25 mg: 25 mg | ORAL | @ 12:00:00 | NDC 82009012805

## 2023-02-05 MED ADMIN — cholecalciferol (vitamin D3 25 mcg (1,000 units)) tablet 50 mcg: 50 ug | ORAL | @ 12:00:00 | NDC 53191040901

## 2023-02-05 MED ADMIN — ertapenem (INVANZ) 500 mg in sodium chloride (NS) 0.9 % 50 mL IVPB: 500 mg | INTRAVENOUS | @ 12:00:00 | Stop: 2023-03-06

## 2023-02-05 MED ADMIN — heparin (porcine) 5,000 unit/mL injection 5,000 Units: 5000 [IU] | SUBCUTANEOUS | @ 10:00:00 | NDC 90011030005

## 2023-02-05 MED ADMIN — heparin (porcine) 5,000 unit/mL injection 5,000 Units: 5000 [IU] | SUBCUTANEOUS | @ 01:00:00 | NDC 90011030005

## 2023-02-05 MED ADMIN — insulin lispro (HumaLOG) injection 0-20 Units: 0-20 [IU] | SUBCUTANEOUS | @ 17:00:00 | NDC 68115074610

## 2023-02-05 MED ADMIN — mycophenolate (CELLCEPT) capsule 500 mg: 500 mg | ORAL | @ 12:00:00 | NDC 82804003330

## 2023-02-05 MED ADMIN — chlorthalidone (HYGROTON) tablet 50 mg: 50 mg | ORAL | @ 12:00:00 | NDC 75834011010

## 2023-02-05 MED ADMIN — insulin lispro (HumaLOG) injection 0-20 Units: 0-20 [IU] | SUBCUTANEOUS | @ 04:00:00 | NDC 68115074610

## 2023-02-05 MED ADMIN — levothyroxine (SYNTHROID) tablet 112 mcg: 112 ug | ORAL | @ 10:00:00 | NDC 72865024190

## 2023-02-05 MED ADMIN — predniSONE (DELTASONE) tablet 5 mg: 5 mg | ORAL | @ 12:00:00 | NDC 55289085902

## 2023-02-05 MED ADMIN — atorvastatin (LIPITOR) tablet 10 mg: 10 mg | ORAL | @ 12:00:00 | NDC 82009000190

## 2023-02-05 MED ADMIN — heparin (porcine) 5,000 unit/mL injection 5,000 Units: 5000 [IU] | SUBCUTANEOUS | @ 18:00:00 | NDC 90011030005

## 2023-02-05 MED ADMIN — mycophenolate (CELLCEPT) capsule 500 mg: 500 mg | ORAL | @ 01:00:00 | NDC 82804003330

## 2023-02-05 MED ADMIN — insulin lispro (HumaLOG) injection 0-20 Units: 0-20 [IU] | SUBCUTANEOUS | @ 22:00:00 | NDC 68115074610

## 2023-02-05 MED ADMIN — hydrALAZINE (APRESOLINE) tablet 75 mg: 75 mg | ORAL | @ 10:00:00 | Stop: 2023-02-05 | NDC 72572026525

## 2023-02-05 MED ADMIN — tacrolimus (PROGRAF) capsule 1 mg: 1 mg | ORAL | @ 12:00:00 | NDC 82804003230

## 2023-02-05 MED ADMIN — amlodipine (NORVASC) tablet 10 mg: 10 mg | ORAL | @ 12:00:00 | NDC 82009002810

## 2023-02-05 NOTE — Unmapped (Signed)
[facility] 3rd attempt refill call set up for this week, however patient currently admitted.  Call date adjusted to later this week pending discharge

## 2023-02-05 NOTE — Unmapped (Signed)
Immunocompromised ID OPAT Plan:     Solid Organ Transplant Infectious Diseases Follow-up Instructions  Appointment: 03/01/23 at 1:30pm with Dr. [name]  Location: 5th floor [facility]; [address] [place] [address]  Labs: weekly CBC with differential, CMP  Please fax labs to patient???s transplant coordinator: Celene Squibb at  [phone] (Kidney/Pancreas) and to [facility] ([facility]ID pharmD at [phone])   Antibiotics:   Ertapenem IV 500mg  daily Antibiotic End Date: TBD in clinic (8 weeks = 03/25/23)

## 2023-02-05 NOTE — Unmapped (Signed)
IMMUNOCOMPROMISED HOST INFECTIOUS DISEASE PROGRESS NOTE    Attending Attestation:  Based on my evaluation, I agree with the findings, assessment, and the plan of care as documented in the advanced practice provider's (APP) note. It was medically necessary for me to see the patient in addition to the APP because of the complexity of the infectious disease. The patient is at risk of decompensation from infection due to underlying immunocompromise and/or mucosal barrier defects. I personally performed the substantive portion of this visit which included a review of this patient's lab results, microbiology data, current medications, and relevant radiological studies for medical decision making .    10 minutes spent face-to-face and non-face-to-face in the care of this patient, which includes all pre, intra, and post visit time on the date of service which was specific to E/M visit.     Julie Olden Se??a, MD MPH  Professor, [facility] Infectious Diseases   Pager [phone]     Assessment/Plan:     Julie [name] is a 72 y.o. 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 Had perinephric drain in place postoperatively, removed 08/30/22  - Immunosuppression: Tacrolimus (goal 5-7), MMF 500 BID (reduced 250mg  bid on 7/6) Pred 5  - Prophylaxis: valganciclovir x 3 months (mod risk, completed), TMP+SMX x 6 months  - baseline Cr ~1.1  - AKI 01/27/23 Estimated Creatinine Clearance: 26.5 mL/min (A) (based on SCr of 1.36 mg/dL (H)).     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 Dubliensis 08/05/22, <10K, negative recipient samples, negative donor blood cx, treated with 2 weeks PO fluconazole  # Positive Hep B sAg and core antibodies, c/w resolved infection, with moderate risk for reactivation 02/2021  - 12/28/22 HBV DNA not detected and HBsAg pending  # Recurrent MDR E.Coli Bacteremia/UTI  - 1/29 BCx/UCx E. Coli - s/p Levo x 3 weeks (thru stent removal, 3/15) (S: Cefazolin, Cipro/Levo, Pip-tazo, Cephalexin; R: TMP-SMX, Amp, Amp-sulb)  - 3/14 BCx/UCx ESBL/FQ-R E. Coli (S: Erta, Gent, Mero, Tobra, Cefepime SDD; I: ceftaz; R: Cipro/Levo, Cefazolin, Ceftriaxone, Amp) s/p 14 days Meropenem   - 4/13 Bcx/Ucx E. Coli (R CFZ, CTX, cipro/levo, I ceftaz, SDD CFP, susc nitro) s/p mero x 2 weeks  - 5/7 seen by urogyn, no structural issues planned to stop nitrofurantoin with switch to methenamine (not done), added vaginal estrogen/d-mannose.  - 6/3 UCx <10,000 E. Coli (S: Cefepime SDD; Erta/Mero, Gent, Nitrofurantoin, Tobra) s/p ertapenem until 01/15/23  - 4/29 started nitrofurantoin prophylaxis; not restarted after 6/24 due to reduced GFR and breakthrough in 12/2022  Active infection:  #Perinephric fluid collection 10/05/22, slowly improving 01/27/23  - 3/14 BCx/UCx ESBL/FQ-R E. Coli (S: Erta, Gent, Mero, Tobra, Cefepime SDD; I: ceftaz; R: Cipro/Levo, Cefazolin, Ceftriaxone, Amp) s/p 14 days Meropenem   - 3/19 IR guided drainage negative cx, Cr 1.5 not suggestive of urinoma  - 4/13 increased stranding, 4/16 not amenable to intervention per VIR  - 01/27/23 CT a/p 7.4 x 1.0 x 5.4 cm crescentic fluid collection within the soft tissues lateral to the transplant kidney (8.3 x 0.9 x 5.5 cm on 11/05/22)  - 7/10 VIR: collection not large enough to aspirate     # Fever and septic shock, presumed urinary source given pyuria and recurrent UTI history 01/27/23  - 7/6 3x emesis in the ED with sudden acute change in mental status, prompting intubation for airway protection --> extubated 7/7  - 7/6 flu/RSV/covid neg; UCx <1,000 CFU (appears on plates as E. Coli); BCx (NG)  - 7/6 CXR more likely edema or aspiration then pneumonia  - 7/6 CT A/P: Bibasilar consolidation. No hydronephrosis. No solid renal mass. Atrophic bilateral native kidneys. Similar appearance of nonobstructive punctate calculi and vascular calcifications. Subcentimeter hypoattenuating lesions within the renal transplant, too small to characterize. No hydronephrosis. Similar inflammatory stranding surrounding the transplant kidney, particularly around the hilum. 7.4 x 1.0 x 5.4 cm crescentic fluid collection within the soft tissues lateral to the transplant kidney (8.3 x 0.9 x 5.5 cm on 11/05/22)  - 7/7 renal transplant [place]: The renal transplant was located in the left lower quadrant. Normal size and echogenicity. 2 subcentimeter anechoic cysts are present. There is pelvic caliectasis.   Rx 7/6 vanc/cefepime -> 7/7 Vanc, mero--> 7/8 meropenem--> 7/9 Ertapenem        Antimicrobial allergy/intolerance:   Penicillin (see allergy list for nuances)  Valacyclovir - nausea         RECOMMENDATIONS    Diagnosis  Tunneled line planned for 7/15-7/17  Pt should have repeat CT abd/pelvis @6  weeks to follow up peri-transplant kidney fluid collection    Management  Continue Ertapenem IV (renally dose)  Duration: minimum 6 weeks  Prolonged duration recommended for suspected infected reservoir of fluid and cysts in transplanted kidney.    Antimicrobial prophylaxis required for transplant immunosuppression   Resolved HBV: obtain LFTs monthly and HBV DNA and SAg for any rise in LFTs    Intensive toxicity monitoring for prescription antimicrobials   CBC w/diff at least once per week  CMP at least once per week  clinical assessments for rashes or other skin changes    Solid Organ Transplant Infectious Diseases Follow-up Instructions  Appointment: 03/01/23 at 1:30pm  Location: 5th floor [facility]; [address] [place] [address]  Labs: weekly CBC with differential, CMP  Please fax labs to patient???s transplant coordinator: Celene Squibb at  [phone] (Kidney/Pancreas) and to Michela Pitcher ([facility]ID pharmD at [phone])   Antibiotics:   Ertapenem IV 500mg  daily Antibiotic End Date: TBD in clinic (8 weeks = 03/25/23)      The [facility] ID APP service will sign off and arrange outpatient ID follow up.           Please page Darolyn Rua, NP at [phone] from 8-4:30pm, after 4:30 pm & on weekends, please page the ID Transplant/Liquid Oncology Fellow consult at [phone] with questions.    Patient discussed with Dr. Estevan Ryder.      Darolyn Rua, MSN, APRN, AGNP-C  Immunocompromised Infectious Disease Nurse Practitioner    I personally spent 15 minutes  face-to-face and non-face-to-face in the care of this patient, which includes all pre, intra, and post visit time on the date of service.  All documented time was specific to the E/M visit and does not include any procedures that may have been performed.      Subjective:     External record(s): Primary team note: reviewed and noted patient continues to have hypertension at this time .    Independent historian(s): no independent historian required.       Interval History:   Afebrile and NAEON. Patient without pain today.     Medications:  Current Medications as of 02/05/2023  Scheduled  PRN   alteplase, 1 mg, Once  amlodipine, 10 mg, Daily  atorvastatin, 10 mg, Daily  carvedilol, 25 mg, BID  chlorthalidone, 50 mg, Daily  cholecalciferol (vitamin D3-10 mcg (400 unit)), 50 mcg, Daily  ertapenem, 500 mg, Q24H  heparin (porcine) for subcutaneous use, 5,000 Units, Q8H SCH  hydrALAZINE, 100 mg, Q8H SCH  insulin glargine, 4 Units, Nightly  insulin lispro, 0-20 Units, ACHS  levothyroxine, 112 mcg, daily  mycophenolate, 500 mg, BID  predniSONE, 5 mg, Daily  tacrolimus, 1 mg, BID      acetaminophen, 650 mg, Q8H PRN  aluminum-magnesium hydroxide-simethicone, 30 mL, Q4H PRN  dextrose in water, 12.5 g, Q10 Min PRN  glucagon, 1 mg, Once PRN  glucose, 16 g, Q10 Min PRN  melatonin, 3 mg, Nightly PRN  ondansetron, 4 mg, Q6H PRN  senna, 2 tablet, Nightly PRN         Objective:     Vital Signs last 24 hours:  Temp:  [36.6 ??C (97.9 ??F)-36.7 ??C (98.1 ??F)] 36.7 ??C (98.1 ??F)  Heart Rate:  [58-65] 63  Resp:  [20-22] 20  BP: (159-169)/(30-50) 161/50  MAP (mmHg):  [62-79] 62  SpO2:  [97 %-100 %] 97 %    Physical Exam:   Patient Lines/Drains/Airways Status       Active Active Lines, Drains, & Airways       Name Placement date Placement time Site Days    CVC Triple Lumen 01/28/23 Non-tunneled Left Internal jugular 01/28/23  0016  Internal jugular  8    Arteriovenous Fistula - Vein Graft  Access 08/24/16 Left;Upper Arm 08/24/16  --  Arm  2356                  Const [x]  vital signs above    []  NAD, non-toxic appearance []  Chronically ill-appearing, non-distressed  Laying in bed sleeping; family at bedside      Eyes [x]  Lids normal bilaterally, conjunctiva anicteric and noninjected OU     [] PERRL  [] EOMI        ENMT [x]  Normal appearance of external nose and ears, no nasal discharge        [x]  MMM, no lesions on lips or gums [x]  No thrush, leukoplakia, oral lesions  []  Dentition good []  Edentulous []  Dental caries present  []  Hearing normal  []  TMs with good light reflexes bilaterally         Neck [x]  Neck of normal appearance and trachea midline        []  No thyromegaly, nodules, or tenderness   []  Full neck ROM        Lymph []  No LAD in neck     []  No LAD in supraclavicular area     []  No LAD in axillae   []  No LAD in epitrochlear chains     []  No  LAD in inguinal areas        CV []  RRR            []  No peripheral edema     []  Pedal pulses intact   []  No abnormal heart sounds appreciated   [x]  Extremities WWP   Murmur present      Resp [x]  Normal WOB at rest    []  No breathlessness with speaking, no coughing  []  CTA anteriorly    []  CTA posteriorly          GI [x]  Normal inspection, NTND   []  NABS     []  No umbilical hernia on exam       []  No hepatosplenomegaly     []  Inspection of perineal and perianal areas normal        GU []  Normal external genitalia     [] No urinary catheter present in urethra   []  No CVA tenderness    []  No tenderness over renal allograft        MSK []  No clubbing or cyanosis of hands       []  No vertebral point tenderness  []  No focal tenderness or abnormalities on palpation of joints in RUE, LUE, RLE, or LLE        Skin [x]  No rashes, lesions, or ulcers of visualized skin     []  Skin warm and dry to palpation   LIJ with dressing c/d/i      Neuro [x]  Face expression symmetric  []  Sensation to light touch grossly intact throughout    []  Moves extremities equally    []  No tremor noted        []  CNs II-XII grossly intact     []  DTRs normal and symmetric throughout []  Gait unremarkable        Psych [x]  Appropriate affect       []  Fluent speech         [x]  Attentive, good eye contact  [x]  Oriented to person, place, time          []  Judgment and insight are appropriate           Data for Medical Decision Making     01/27/23 EKG QTcF    I discussed mgm't w/qualified health care professional(s) involved in case: discussed with primary team discharge plan .    I reviewed CBC results (WBC improved and WNL), chemistry results (hypokalemic with improved creatinine), and therapeutic drug levels (tacrolimus level within goal).    I independently visualized/interpreted not done.       Recent Labs     Units 02/03/23  0622 02/03/23  0846 02/05/23  0548   WBC 10*9/L 3.6   < > 4.0   HGB g/dL 7.7*   < > 8.3*   PLT 16*1/W 144*   < > 194   NEUTROABS 10*9/L 2.3   < > 2.6   LYMPHSABS 10*9/L 1.0*   < > 1.0*   EOSABS 10*9/L 0.1   < > 0.1   BUN mg/dL 22   < > 25*   CREATININE mg/dL 9.60*   < > 4.54*   AST U/L 11  --   --    ALT U/L <7*  --   --    BILITOT mg/dL 0.2*  --   --    ALKPHOS U/L 57  --   --    K mmol/L 5.2*   < > 5.2*   MG mg/dL 2.1   < >  1.7   CALCIUM mg/dL 9.1   < > 9.3    < > = values in this interval not displayed.     Lab Results   Component Value Date    TACROLIMUS 5.5 02/05/2023    ALBUMIN 3.0 (L) 02/03/2023       Microbiology:  Microbiology Results (last day)       ** No results found for the last 24 hours. **              Imaging:  No new imaging

## 2023-02-05 NOTE — Unmapped (Signed)
Patient alert and oriented x4, denies any pain,remained afebrile this shift, IV antibiotics given as scheduled , no adverse reactions noted. NPO post midnight instructed for tunneled line insertion tomorrow.Will continue to monitor.  Problem: Adult Inpatient Plan of Care  Goal: Plan of Care Review  Outcome: Progressing  Flowsheets (Taken 02/03/2023 1813)  Progress: improving  Plan of Care Reviewed With: patient  Goal: Patient-Specific Goal (Individualized)  Outcome: Progressing  Flowsheets (Taken 02/03/2023 1813)  Patient/Family-Specific Goals (Include Timeframe): tunneled line insertion on monday 7/16  Goal: Absence of Hospital-Acquired Illness or Injury  Outcome: Progressing  Intervention: Identify and Manage Fall Risk  Recent Flowsheet Documentation  Taken 02/03/2023 0800 by Valetta Fuller, RN  Safety Interventions:   fall reduction program maintained   family at bedside   low bed   nonskid shoes/slippers when out of bed  Goal: Optimal Comfort and Wellbeing  Outcome: Progressing  Goal: Readiness for Transition of Care  Outcome: Progressing  Goal: Rounds/Family Conference  Outcome: Progressing     Problem: Fall Injury Risk  Goal: Absence of Fall and Fall-Related Injury  Outcome: Progressing  Intervention: Promote Scientist, clinical (histocompatibility and immunogenetics) Documentation  Taken 02/03/2023 0800 by Valetta Fuller, RN  Safety Interventions:   fall reduction program maintained   family at bedside   low bed   nonskid shoes/slippers when out of bed     Problem: Self-Care Deficit  Goal: Improved Ability to Complete Activities of Daily Living  Outcome: Progressing     Problem: Skin Injury Risk Increased  Goal: Skin Health and Integrity  Outcome: Progressing  Intervention: Optimize Skin Protection  Recent Flowsheet Documentation  Taken 02/03/2023 0800 by Valetta Fuller, RN  Head of Bed [facility]) Positioning: HOB elevated     Problem: Infection  Goal: Absence of Infection Signs and Symptoms  Outcome: Progressing

## 2023-02-05 NOTE — Unmapped (Signed)
Infectious Disease ([facility]) Progress Note    Assessment & Plan:   Julie [name] is a 72 y.o. female whose presentation is complicated by DDKT 08/06/22 (on cellcept, tac, and prednisone), HTN, HLD, T2DM, thrombocytopenia, recurrent ESBL E.coli bacteremia 2/2 MDR pyelonephritis that presented to [facility] with sepsis on 7/6, again presumed secondary to transplant pyelonephritis. Has a fluid collection around transplant kidney with ICID planning 6 week IV antibiotic treatment.    Principal Problem (Resolved):    Sepsis with encephalopathy without septic shock (CMS-HCC)  Active Problems:    Type 2 diabetes mellitus with diabetic nephropathy, with long-term current use of insulin (CMS-HCC)    End-stage renal disease (CMS-HCC)    Kidney transplant recipient    Anemia due to stage 5 chronic kidney disease, not on chronic dialysis (CMS-HCC)    Immunosuppressed status (CMS-HCC)    Hypertension    Hyperlipidemia    Chronic idiopathic thrombocytopenia (CMS-HCC)    Acute metabolic encephalopathy    AKI (acute kidney injury) (CMS-HCC)    Acute metabolic acidosis      Active Problems     Transplant pyelonephritis  7/6 P/w encephalopathy, nausea/vomiting necesitating intubation for airway protection initially. 7/7 extubated. UA w/ Lg LE, nitrites positive, sm blood, >182 WBC <1 Sq. Ucx grew <1000 cfu but on plates appeared as E coli). No hydronephrosis. Similar inflammatory stranding surrounding the transplant kidney. CT AP w/ bibasilar consolidatiosn c/w aspiration. 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 VIR c/s and collection not large enough to aspirate.  - Rx 7/6 vanc/cefepime -> 7/7 Vanc, mero--> 7/8 meropenem--> 7/9 Ertapenem   - Continue Ertapenem IV 500mg  daily on 7/14, 8 weeks course, final length to be determined at outpatient follow up  - Lab monitoring: weekly CBC with differential, CMP   - Previous blood and urine cultures have been negative.  - IR tunneled catheter insertion scheduled for 7/15-7/17  - NPO midnight     AKI on DDKT  08/06/22 2/2 Type 2 diabetes mellitus.  AKI on admission. Follows with Dr. Elvera Maria. Baseline cr 0.9-1.1. Peaked at 1.97 shortly after adm, improved w/ fluids c/w low effective circulatory volume AKI (vs less likely sepsis-ATN based on trend), s/p 500 cc 7/12.  - Transplant nephrology following  - Immunosuppression: Tacrolimus (goal 5-7), cellcept 500 BID, Pred 5  - MWF tacrolimus levels and pharmacy consult to assist with dosing  - Prophylaxis: valganciclovir x 3 months (mod risk, completed), TMP+SMX x 6 months course, completed  - Cr 1.36 today, continuing to monitor     Hyperkalemia  BID lytes, discontinued spironolactone. Last dose bactrim 7/12.  Received Lokelma 10 g and shifted 7/12  -5.2 today, monitoring  -Lokelma x1 for potassium 5.5 or greater  -Obtain ECG and shift for potassium 6.0 or greater  -Consider restarting spironolactone versus alternative antihypertensive based on current    HTN  Today 169/42  -Continue amlodipine 10 mg daily  -Continue home carvedilol 25 mg twice daily  -Chlorthalidone increased to 50 mg daily  -Continue home hydralazine, increased to 100 mg TID for BP control.     T2DM  - Continue long-acting glargine and SSI  - Glucose 110     Acute on chronic anemia  - Most likely secondary to anemia of chronic kidney disease and anemia of inflammation  - Hgb today 8.3  - Limit blood draws  Issues Impacting Complexity of Management:  The patient is at high risk from Hospital immobility in an elderly patient given baseline poor functional status with a high risk of causing delirium and further decline in function        Medical Decision Making: Reviewed records from the following unique sources consult notes from VIR, transplant nephrology and IC ID.      Daily Checklist:  Diet: Regular Diet  DVT PPx: Heparin 5000units q8h  Electrolytes: Replete Potassium to >/= 3.6 and Magnesium to >/= 1.8  Code Status: Full Code  Dispo: Transfer to Floor    Team Contact Information:   Primary Team: Infectious Disease ([facility])  Primary Resident: Quillian Quince, DO  Resident's Pager: [phone] (Infect Disease Intern - Tower)    Interval History:   No acute events overnight. Tolerating PO well without any nausea or vomiting.  Denies any concerns today.  No dysuria, urgency, or suprapubic pain. Tunneled catheter could not be scheduled today. Once this is done she will be ready for discharge.      Objective:     Gen: NAD, A&Ox4  HENT: atraumatic, normocephalic  Heart: RRR  Lungs: CTAB, no crackles or wheezes  Abdomen: soft, NTND  Extremities: No edema       Jenita Seashore, DO  PGY-1 Internal Medicine

## 2023-02-05 NOTE — Unmapped (Signed)
Patient alert and oriented x 4, VSS and all medications administered as ordered. No c/o pain. Patient NPO for line placement today. No acute issues to report overnight. Husband at bedside. Patient encouraged to call for any  needs. Plan of care continues.     Problem: Adult Inpatient Plan of Care  Goal: Plan of Care Review  Outcome: Progressing  Goal: Patient-Specific Goal (Individualized)  Outcome: Progressing  Flowsheets (Taken 02/05/2023 0618)  Patient/Family-Specific Goals (Include Timeframe): Patient plans to get tunneled line placed today, then discharge home.  Individualized Care Needs: abx  Goal: Absence of Hospital-Acquired Illness or Injury  Outcome: Progressing  Intervention: Identify and Manage Fall Risk  Recent Flowsheet Documentation  Taken 02/04/2023 2000 by Creed Copper, RN  Safety Interventions:   lighting adjusted for tasks/safety   low bed   isolation precautions   nonskid shoes/slippers when out of bed  Intervention: Prevent Infection  Recent Flowsheet Documentation  Taken 02/04/2023 2000 by Creed Copper, RN  Infection Prevention:   cohorting utilized   environmental surveillance performed   single patient room provided   rest/sleep promoted  Goal: Optimal Comfort and Wellbeing  Outcome: Progressing  Goal: Readiness for Transition of Care  Outcome: Progressing  Goal: Rounds/Family Conference  Outcome: Progressing     Problem: Fall Injury Risk  Goal: Absence of Fall and Fall-Related Injury  Outcome: Progressing  Intervention: Promote Injury-Free Environment  Recent Flowsheet Documentation  Taken 02/04/2023 2000 by Creed Copper, RN  Safety Interventions:   lighting adjusted for tasks/safety   low bed   isolation precautions   nonskid shoes/slippers when out of bed     Problem: Self-Care Deficit  Goal: Improved Ability to Complete Activities of Daily Living  Outcome: Progressing     Problem: Skin Injury Risk Increased  Goal: Skin Health and Integrity  Outcome: Progressing Problem: Infection  Goal: Absence of Infection Signs and Symptoms  Outcome: Progressing  Intervention: Prevent or Manage Infection  Recent Flowsheet Documentation  Taken 02/04/2023 2000 by Creed Copper, RN  Infection Management: aseptic technique maintained  Isolation Precautions: contact precautions maintained

## 2023-02-06 LAB — CBC W/ AUTO DIFF
BASOPHILS ABSOLUTE COUNT: 0 10*9/L (ref 0.0–0.1)
BASOPHILS RELATIVE PERCENT: 0.4 %
EOSINOPHILS ABSOLUTE COUNT: 0.1 10*9/L (ref 0.0–0.5)
EOSINOPHILS RELATIVE PERCENT: 1.8 %
HEMATOCRIT: 26 % — ABNORMAL LOW (ref 34.0–44.0)
HEMOGLOBIN: 8.5 g/dL — ABNORMAL LOW (ref 11.3–14.9)
LYMPHOCYTES ABSOLUTE COUNT: 1.2 10*9/L (ref 1.1–3.6)
LYMPHOCYTES RELATIVE PERCENT: 25.1 %
MEAN CORPUSCULAR HEMOGLOBIN CONC: 32.8 g/dL (ref 32.0–36.0)
MEAN CORPUSCULAR HEMOGLOBIN: 32.2 pg (ref 25.9–32.4)
MEAN CORPUSCULAR VOLUME: 98.1 fL — ABNORMAL HIGH (ref 77.6–95.7)
MEAN PLATELET VOLUME: 7.8 fL (ref 6.8–10.7)
MONOCYTES ABSOLUTE COUNT: 0.3 10*9/L (ref 0.3–0.8)
MONOCYTES RELATIVE PERCENT: 5.6 %
NEUTROPHILS ABSOLUTE COUNT: 3.1 10*9/L (ref 1.8–7.8)
NEUTROPHILS RELATIVE PERCENT: 67.1 %
PLATELET COUNT: 212 10*9/L (ref 150–450)
RED BLOOD CELL COUNT: 2.65 10*12/L — ABNORMAL LOW (ref 3.95–5.13)
RED CELL DISTRIBUTION WIDTH: 14.2 % (ref 12.2–15.2)
WBC ADJUSTED: 4.7 10*9/L (ref 3.6–11.2)

## 2023-02-06 LAB — BASIC METABOLIC PANEL
ANION GAP: 7 mmol/L (ref 5–14)
BLOOD UREA NITROGEN: 25 mg/dL — ABNORMAL HIGH (ref 9–23)
BUN / CREAT RATIO: 19
CALCIUM: 9.1 mg/dL (ref 8.7–10.4)
CHLORIDE: 116 mmol/L — ABNORMAL HIGH (ref 98–107)
CO2: 21 mmol/L (ref 20.0–31.0)
CREATININE: 1.29 mg/dL — ABNORMAL HIGH
EGFR CKD-EPI (2021) FEMALE: 44 mL/min/{1.73_m2} — ABNORMAL LOW (ref >=60)
GLUCOSE RANDOM: 95 mg/dL (ref 70–99)
POTASSIUM: 5.1 mmol/L — ABNORMAL HIGH (ref 3.4–4.8)
SODIUM: 144 mmol/L (ref 135–145)

## 2023-02-06 LAB — MAGNESIUM: MAGNESIUM: 1.6 mg/dL (ref 1.6–2.6)

## 2023-02-06 LAB — PROTIME-INR
INR: 1.01
PROTIME: 11.3 s (ref 9.9–12.6)

## 2023-02-06 MED ORDER — HYDRALAZINE 100 MG TABLET
ORAL_TABLET | Freq: Three times a day (TID) | ORAL | 0 refills | 30 days | Status: CP
Start: 2023-02-06 — End: 2023-03-08
  Filled 2023-02-06: qty 90, 30d supply, fill #0

## 2023-02-06 MED ORDER — CHLORTHALIDONE 25 MG TABLET
ORAL_TABLET | Freq: Every day | ORAL | 0 refills | 30 days | Status: CP
Start: 2023-02-06 — End: 2023-03-08

## 2023-02-06 MED ADMIN — predniSONE (DELTASONE) tablet 5 mg: 5 mg | ORAL | @ 12:00:00 | Stop: 2023-02-06 | NDC 55289085902

## 2023-02-06 MED ADMIN — levothyroxine (SYNTHROID) tablet 112 mcg: 112 ug | ORAL | @ 10:00:00 | Stop: 2023-02-06 | NDC 72865024190

## 2023-02-06 MED ADMIN — heparin (porcine) 5,000 unit/mL injection 5,000 Units: 5000 [IU] | SUBCUTANEOUS | @ 10:00:00 | Stop: 2023-02-06 | NDC 90011030005

## 2023-02-06 MED ADMIN — cholecalciferol (vitamin D3 25 mcg (1,000 units)) tablet 50 mcg: 50 ug | ORAL | @ 12:00:00 | Stop: 2023-02-06 | NDC 53191040901

## 2023-02-06 MED ADMIN — lidocaine (PF) (XYLOCAINE-MPF) 10 mg/mL (1 %) injection: SUBCUTANEOUS | @ 14:00:00 | Stop: 2023-02-06 | NDC 83090000310

## 2023-02-06 MED ADMIN — midazolam (VERSED) injection: INTRAVENOUS | @ 14:00:00 | Stop: 2023-02-06 | NDC 61703032173

## 2023-02-06 MED ADMIN — mycophenolate (CELLCEPT) capsule 500 mg: 500 mg | ORAL | @ 12:00:00 | Stop: 2023-02-06 | NDC 82804003330

## 2023-02-06 MED ADMIN — fentaNYL (PF) (SUBLIMAZE) injection: INTRAVENOUS | @ 14:00:00 | Stop: 2023-02-06 | NDC 50458002005

## 2023-02-06 MED ADMIN — tacrolimus (PROGRAF) capsule 1 mg: 1 mg | ORAL | NDC 82804003230

## 2023-02-06 MED ADMIN — chlorthalidone (HYGROTON) tablet 50 mg: 50 mg | ORAL | @ 12:00:00 | Stop: 2023-02-06 | NDC 75834011010

## 2023-02-06 MED ADMIN — heparin (porcine) 5,000 unit/mL injection 5,000 Units: 5000 [IU] | SUBCUTANEOUS | @ 01:00:00 | NDC 90011030005

## 2023-02-06 MED ADMIN — carvedilol (COREG) tablet 25 mg: 25 mg | ORAL | @ 12:00:00 | Stop: 2023-02-06 | NDC 82009012805

## 2023-02-06 MED ADMIN — hydrALAZINE (APRESOLINE) tablet 100 mg: 100 mg | ORAL | @ 10:00:00 | Stop: 2023-02-06 | NDC 77771018501

## 2023-02-06 MED ADMIN — mycophenolate (CELLCEPT) capsule 500 mg: 500 mg | ORAL | NDC 82804003330

## 2023-02-06 MED ADMIN — tacrolimus (PROGRAF) capsule 1 mg: 1 mg | ORAL | @ 12:00:00 | Stop: 2023-02-06 | NDC 82804003230

## 2023-02-06 MED ADMIN — sodium chloride (NS) 0.9 % infusion: INTRAVENOUS | @ 13:00:00 | Stop: 2023-02-06 | NDC 05928011001

## 2023-02-06 MED ADMIN — ertapenem (INVANZ) 500 mg in sodium chloride (NS) 0.9 % 50 mL IVPB: 500 mg | INTRAVENOUS | @ 12:00:00 | Stop: 2023-02-06

## 2023-02-06 MED ADMIN — heparin (porcine) 5,000 unit/mL injection 5,000 Units: 5000 [IU] | SUBCUTANEOUS | @ 18:00:00 | Stop: 2023-02-06 | NDC 90011030005

## 2023-02-06 MED ADMIN — insulin glargine (LANTUS) injection 4 Units: 4 [IU] | SUBCUTANEOUS | NDC 68115083910

## 2023-02-06 MED ADMIN — melatonin tablet 3 mg: 3 mg | ORAL | NDC 96295013723

## 2023-02-06 MED ADMIN — amlodipine (NORVASC) tablet 10 mg: 10 mg | ORAL | @ 12:00:00 | Stop: 2023-02-06 | NDC 82009002810

## 2023-02-06 MED ADMIN — atorvastatin (LIPITOR) tablet 10 mg: 10 mg | ORAL | @ 12:00:00 | Stop: 2023-02-06 | NDC 82009000190

## 2023-02-06 NOTE — Unmapped (Signed)
The below services have been ordered for you by your medical team to help your health and safety at home.    If Home Health services have been arranged, the agency listed below will be contacting you to set up a time for them to come see you in your home within 2 days of your discharge.  If you have not heard from them prior to 02/08/23 or you have any questions about home health, please contact them at the phone number listed below.    [facility] - Admitted Since 01/27/2023       Service Provider Selected Services Address Phone Fax Patient Preferred    [facility] [address] [place], [place] [address] [phone] --          [facility]  - Admitted Since 01/27/2023       Service Provider Selected Services Address Phone Fax Patient Preferred    [facility] [address] [place], [place] [address] [phone] --          Start of Care: 02/07/23    Please contact your Post-Transplant Coordinator if you have any problems/concerns:  Celene Squibb [phone]; [phone]

## 2023-02-06 NOTE — Unmapped (Signed)
Physician Discharge Summary [facility]  [address] [facility]  [address]  [place] [address]  Dept: [phone]  Loc: [phone]     Identifying Information:   Julie [name]  01/21/1951  [id number]    Primary Care Physician: Barbette Reichmann, MD     Code Status: Full Code    Admit Date: 01/27/2023    Discharge Date: 02/06/2023     Discharge To: Home    Discharge Service: [facility] - Infectious Disease Floor Team (MED K - Tower)     Discharge Attending Physician: Ebony Hail, MD    Discharge Diagnoses:   Principal Problem (Resolved):    Sepsis with encephalopathy without septic shock (CMS-HCC) (POA: Yes)  Active Problems:    Type 2 diabetes mellitus with diabetic nephropathy, with long-term current use of insulin (CMS-HCC) (POA: Not Applicable)    End-stage renal disease (CMS-HCC) (POA: Yes)    Kidney transplant recipient (POA: Not Applicable)    Anemia due to stage 5 chronic kidney disease, not on chronic dialysis (CMS-HCC) (POA: Yes)    Immunosuppressed status (CMS-HCC) (POA: Yes)    Hypertension (POA: Yes)    Hyperlipidemia (POA: Yes)    Chronic idiopathic thrombocytopenia (CMS-HCC) (POA: Yes)    Acute metabolic encephalopathy (POA: Yes)    AKI (acute kidney injury) (CMS-HCC) (POA: Yes)    Acute metabolic acidosis (POA: Yes)      Hospital Course:   Julie [name] is a 72 y.o. 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 C/f sepsis 2/2 UTI, on meropenem.     AMS - Acute metabolic encephalopathy w/ fever  Known history of AMS with fevers, see prior admission notes (Jan, Feb, March, Apr, June).     Intubated - C/f airway protection  Intubated in ED due to concerns of airway protection related to emesis x3 and sudden onset AMS. Extubated 7/7 to [place]. Discharged on room air.     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 Elvera Maria. Lowest Cr ~0.95 after DDKT, presumed new baseline between 0.9-1.2. Cr 1.3 on 7/6 admission, with uptrended to 1.92 and minimal UOP. Transplant renal US 7/7 with dec peak systolic velocity of the main renal artery at the anastomosis with overall decreased resistive indices when compared to 12/25/2022. Thin, heterogeneous collection lateral to the transplant kidney, may reflect residua of the perinephric collection noted on prior studies. Transplant nephrology consulted, continued Tacrolimus 1g AM and 0.5mg  PM, reduced cellcept 250mg  BID in setting of infection of unknown origin (home cellcept 500mg  BID). Continued home prednisone 5mg  daily.      Fever in IC host - Lactic Acidosis - C/f bacteremia   Presented febrile. Has had 5 admissions this year with similar presentation of AMS and fever found with ESBL E.coli bacteremia 2/2 MDR UTI, see above for further hx. Most recent course of ertapenem completed 01/15/23 for presumed graft pyelonephritis in absence of clear infectious source. Received vancomycin and cefepime in ED. Transitioned to IV meropenem based on prior susceptibilities on arrival to MICU. Pursued comprehensive infectious workup for IC patient with fever.      Febrile with WBC elevated at 11.5 and lactate to 3.1. CXR unremark, UA concerning for UTI. Ucx and blood culture was negative. CTAP with stable to mildly decreased size of the crescentic fluid collection  within the overlying soft tissues measuring up to 7.4 cm in size lateral to transplant kidney. Cultured with negative studies. Limited evaluation for abscess given absence of IV contrast. VIR c/s and collection not large enough to aspirate. ICID consulted, antibiotics course inpatient as follows: 7/6 vanc/cefepime -> 7/7 Vanc, mero--> 7/8 meropenem--> 7/9 Ertapenem. Transitioned to Ertapenem IV 500mg  daily on 7/14 for a 6-8 weeks course. Final duration of course to be determined at outpatient infectious disease appointment.     - Prophlyaxis:  - Continued TMP+SMX renally dosed equivalent 400/80 MWF to complete 6 months, completed  - Had completed valcyte for 3 months.    Hypertension  Elevated BP since admission with MAP in 80s. Holding home meds below in setting of acute illness and concern for bacteremia. Chlorthalidone was increased to 50mg  daily and hydralazine was increased to 100mg  daily for blood pressure control. Spironolactone was held on admission due to elevated potassium. Blood pressure well controlled on discharge. On discharge patient continued on amlodipine, carvedilol, chlorthalidone, and hydralazine.     Acute on Chronic Thrombocytopenia  Known chronic thrombocytopenia ~110-130, presented with platelets of 73. Of note, patient has had similar drop in platelets during previous admissions for bacteremia with resolution back to baseline. Suspect drop is in response to acute illness. Platelets on discharge 212.     Anemia on CKD  Most likely secondary to anemia of chronic disease and anemia of inflammation. Baseline Hgb ~10. Iron panel and ferritin at prior June admission consistent with AoCD.    T2DM, insulin-dependent: Most recent 12/07/22 A1C 5.8. BG WNL during hospital stay.  Hypothyroidism: Continue home synthroid 112 mcg daily  HLD: Continue home atorvastatin 10 mg daily             Outpatient Provider Follow Up Issues:   -- Repeat CT abd/pelvis @6  weeks to follow up peri-transplant kidney fluid collection  -- Ertapenem IV for minimum of 6 weeks, total duration to be determined at outpatient follow up      Procedures:  Left-sided tunneled Single-lumen Powerline central venous catheter in the internal jugular vein  ______________________________________________________________________  Discharge Medications:      Your Medication List        START taking these medications      ertapenem 500 mg, OVERFILL 7 mL in sodium chloride 0.9 % 50 mL IVPB  Infuse 500 mg into a venous catheter daily.            ASK your doctor about these medications      ACCU-CHEK AVIVA PLUS TEST STRP Strp  Generic drug: blood sugar diagnostic  USE 1 STRIP TO CHECK GLUCOSE THREE TIMES DAILY     blood sugar diagnostic Strp  USE 1 STRIP TO CHECK GLUCOSE THREE TIMES DAILY     ACCU-CHEK GUIDE TEST STRIPS Strp  Generic drug: blood sugar diagnostic  Use to check blood sugar as directed with insulin 3 times a day & for symptoms of high or low blood sugar.     ACCU-CHEK SOFTCLIX LANCETS lancets  Generic drug: lancets  Use to check blood sugar as directed with insulin 3 times a day & for symptoms of high or low blood sugar.     alendronate 70 MG tablet  Commonly known as: FOSAMAX  Take 1 tablet (70 mg total) by mouth every seven (7) days.     amlodipine 5 MG tablet  Commonly known as: NORVASC  Take 1 tablet (5 mg total) by mouth every twelve (12) hours.  atorvastatin 10 MG tablet  Commonly known as: LIPITOR  Take 1 tablet (10 mg total) by mouth daily.     BD ULTRA-FINE NANO PEN NEEDLE 32 gauge x 5/32 (4 mm) Ndle  Generic drug: pen needle, diabetic  Use with insulin 2 times daily     BD ULTRA-FINE NANO PEN NEEDLE 32 gauge x 5/32 (4 mm) Ndle  Generic drug: pen needle, diabetic  Use with insulin up to 4 times/day as needed.     blood-glucose meter kit  Use as directed. ACCU-CHEK AVIVA     ACCU-CHEK GUIDE GLUCOSE METER Misc  Generic drug: blood-glucose meter  Use as instructed     carvedilol 25 MG tablet  Commonly known as: COREG  Take 1 tablet (25 mg total) by mouth two (2) times a day.     chlorthalidone 25 MG tablet  Commonly known as: HYGROTON  Take 1 tablet (25 mg total) by mouth daily.     cholecalciferol-25 mcg (1,000 unit) 1,000 unit (25 mcg) tablet  Generic drug: cholecalciferol (vitamin D3 25 mcg (1,000 units))  Take 2 tablets (50 mcg total) by mouth daily.     DEXCOM G7 SENSOR Devi  Generic drug: blood-glucose sensor  Use 1 sensor every 10 days.     estradiol 0.01 % (0.1 mg/gram) vaginal cream  Commonly known as: ESTRACE  Insert 2 g into the vagina nightly.     hydrALAZINE 50 MG tablet  Commonly known as: APRESOLINE  Take 1 tablet (50 mg total) by mouth two (2) times a day.     insulin glargine 100 unit/mL (3 mL) injection pen  Commonly known as: BASAGLAR, LANTUS  Inject 0.03 mL (3 Units total) under the skin nightly.     levothyroxine 112 MCG tablet  Commonly known as: SYNTHROID  Take 1 tablet (112 mcg total) by mouth daily.     magnesium oxide-Mg AA chelate 133 mg  Commonly known as: Magnesium Plus Protein  Take 1 tablet by mouth two (2) times a day.     mycophenolate 250 mg capsule  Commonly known as: CELLCEPT  Take 2 capsules (500 mg total) by mouth two (2) times a day.     NOVOLOG FLEXPEN SUBQ  Inject 0-8 Units under the skin Three (3) times a day before meals. *Max daily dose: 33 units*     pantoprazole 40 MG tablet  Commonly known as: Protonix  Take 1 tablet (40 mg total) by mouth daily.     predniSONE 5 MG tablet  Commonly known as: DELTASONE  Take 1 tablet (5 mg total) by mouth in the morning.     sodium bicarbonate 650 mg tablet  Take 1 tablet (650 mg total) by mouth Three (3) times a day.     spironolactone 25 MG tablet  Commonly known as: ALDACTONE  Take 1 tablet (25 mg total) by mouth daily.     sulfamethoxazole-trimethoprim 400-80 mg per tablet  Commonly known as: BACTRIM  Take 1 tablet (80 mg of trimethoprim total) by mouth Every Monday, Wednesday, and Friday.     tacrolimus 0.5 MG capsule  Commonly known as: PROGRAF  Take 2 capsules (1 mg total) by mouth daily AND 2 capsules (1 mg total) nightly.              Allergies:  Gabapentin and Valacyclovir  ______________________________________________________________________  Pending Test Results:      Most Recent Labs:  All lab results last 24 hours -   Recent Results (from the past 24  hour(s))   POCT Glucose    Collection Time: 02/05/23  5:09 PM   Result Value Ref Range    Glucose, POC 207 (H) 70 - 179 mg/dL   POCT Glucose    Collection Time: 02/05/23 11:07 PM   Result Value Ref Range Glucose, POC 112 70 - 179 mg/dL   PT-INR    Collection Time: 02/06/23  6:35 AM   Result Value Ref Range    PT 11.3 9.9 - 12.6 sec    INR 1.01    POCT Glucose    Collection Time: 02/06/23  7:25 AM   Result Value Ref Range    Glucose, POC 104 70 - 179 mg/dL   Basic Metabolic Panel    Collection Time: 02/06/23  7:28 AM   Result Value Ref Range    Sodium 144 135 - 145 mmol/L    Potassium 5.1 (H) 3.4 - 4.8 mmol/L    Chloride 116 (H) 98 - 107 mmol/L    CO2 21.0 20.0 - 31.0 mmol/L    Anion Gap 7 5 - 14 mmol/L    BUN 25 (H) 9 - 23 mg/dL    Creatinine 1.61 (H) 0.55 - 1.02 mg/dL    BUN/Creatinine Ratio 19     eGFR CKD-EPI (2021) Female 44 (L) >=60 mL/min/1.41m2    Glucose 95 70 - 99 mg/dL    Calcium 9.1 8.7 - 09.6 mg/dL   Magnesium Level    Collection Time: 02/06/23  7:28 AM   Result Value Ref Range    Magnesium 1.6 1.6 - 2.6 mg/dL   CBC w/ Differential    Collection Time: 02/06/23  7:28 AM   Result Value Ref Range    WBC 4.7 3.6 - 11.2 10*9/L    RBC 2.65 (L) 3.95 - 5.13 10*12/L    HGB 8.5 (L) 11.3 - 14.9 g/dL    HCT 04.5 (L) 40.9 - 44.0 %    MCV 98.1 (H) 77.6 - 95.7 fL    MCH 32.2 25.9 - 32.4 pg    MCHC 32.8 32.0 - 36.0 g/dL    RDW 81.1 91.4 - 78.2 %    MPV 7.8 6.8 - 10.7 fL    Platelet 212 150 - 450 10*9/L    Neutrophils % 67.1 %    Lymphocytes % 25.1 %    Monocytes % 5.6 %    Eosinophils % 1.8 %    Basophils % 0.4 %    Absolute Neutrophils 3.1 1.8 - 7.8 10*9/L    Absolute Lymphocytes 1.2 1.1 - 3.6 10*9/L    Absolute Monocytes 0.3 0.3 - 0.8 10*9/L    Absolute Eosinophils 0.1 0.0 - 0.5 10*9/L    Absolute Basophils 0.0 0.0 - 0.1 10*9/L   POCT Glucose    Collection Time: 02/06/23 11:49 AM   Result Value Ref Range    Glucose, POC 141 70 - 179 mg/dL       Relevant Studies/Radiology:  IR Insert Tunneled Catheter (Age Greater Than 5 Years)    Result Date: 02/06/2023  PROCEDURE: Tunneled central venous catheter placement ACCESSION: [id number] UN     Procedural Personnel Attending physician(s): Dr. Ivar Bury Resident physician(s): Dr. Estevan Oaks Advanced practice provider(s): None     Procedure Date (mm/dd/yyyy): 02/06/2023 11:03 AM     Pre-procedure diagnosis: Long-term antibiotic therapy Post-procedure diagnosis: Same Indication: Administration of antibiotics Additional clinical history: None _______________________________________________________________     PROCEDURE SUMMARY: - Venous access with ultrasound guidance - Tunneled central venous catheter insertion with  fluoroscopic guidance - Additional procedure(s): None     PROCEDURE DETAILS:     Pre-procedure Consent: Informed consent for the procedure including risks, benefits and alternatives was obtained and time-out was performed prior to the procedure. Preparation: The site was prepared and draped using maximal sterile barrier technique including cutaneous antisepsis.     Anesthesia/sedation Level of anesthesia/sedation: Moderate sedation (conscious sedation) Anesthesia/sedation administered by: Independent trained observer under attending supervision with continuous monitoring of the patient's level of consciousness and physiologic status Total intra-service sedation time (minutes): I personally spent 35 minutes, continuously monitoring the patient face-to-face during the administration of moderate sedation. Radiology nurse was present for the duration of the procedure to assist in patient monitoring. Pre and Post Sedation activities have been reviewed.     Access Local anesthesia was administered. Initial sonographic evaluation of the right internal jugular vein demonstrated central thrombus/occlusion with poor visualization of the right brachiocephalic vein so decision was made to evaluate the left internal jugular vein. The left internal jugular vein was sonographically evaluated and determined to be patent. Real time ultrasound was used to visualize needle entry into the vessel and a permanent image was stored. Laterality: Left Vein accessed: Internal jugular vein Access technique: Micropuncture set with 21 gauge needle     Catheter placement An incision was made near the venous access site and the catheter was tunneled subcutaneously to the venous access site and trimmed to appropriate length. The catheter was advanced via a peel-away sheath into the vein under fluoroscopic guidance. Catheter tip location was fluoroscopically verified and a permanent image was stored. Catheter placed: Powerline Lot number: na Catheter size Janice Norrie): 5 Lumens: Single Power injectable: Yes Catheter tip: cavoatrial junction Catheter flush: Heparin (100 units/mL)     Closure A sterile dressing was applied. Access site closure technique: Tissue adhesive Catheter securement technique: Non-absorbable suture     Contrast Contrast agent: Omnipaque 300 Contrast volume (mL): 0     Radiation Dose Fluoroscopy time (minutes): 1.4 Reference Air Kerma (mGy): 1.3     Additional Details Additional description of procedure: None Registry event: V/3/g Device used: None Equipment details: None Unique Device Identifiers: Not available Specimens removed: None Estimated blood loss (mL): Less than 10 Standardized report: [email or web address]_v3.1         Complications: No immediate complications.             Insertion of left-sided tunneled Single-lumen Powerline central venous catheter in the internal jugular vein, with tip in the expected location of the cavoatrial junction.     The right internal jugular vein was thrombosed/occluded centrally with poor visualization of the right brachiocephalic vein.     Plan:     The catheter may be used immediately.             Attestation Signer name: Benjamine Mola, MD I attest that I was I reviewed the stored images and agree with the report as written.    ______________________________________________________________________  Discharge Instructions:     It was a pleasure taking care of you!    You were admitted to [facility] with sepsis secondary to recurrent ESBL E.coli UTI and bacteremia. This was most likely caused by pyelonephritis of the transplant kidney. We have started you on Ertapenem IV to treat the infection.    You are now ready to discharge and will be discharging to: Home    Please carefully read and follow these instructions below upon your discharge:    1) Please take your medications as  prescribed and note the changes listed on your discharge. At future follow-up appointments, please be sure to take all of your medications with you so your provider can better guide your care.     2) Seek medical care with your primary care doctor or local Emergency Room or Urgent Care if you develop any changes in your mental status, worsening abdominal pain, fevers greater than 101.5, any unexplained/unrelieved shortness of breath, uncontrolled nausea and vomiting that keeps you from remaining hydrated or taking your medication, or any other concerning symptoms.     3) Please go to your follow-up appointments. Some of your follow-up appointments have been listed below. If you do not see an appointment listed below with your primary care doctor, please call your doctor's office as soon as possible to schedule an appointment to be seen within 7-10 days of discharge.     4) If you have any concerns before you are able to follow-up with your primary care doctor, you can reach [place] by calling [phone] and asking to page the infectious disease resident on call.    MEDICATION CHANGES:  -- Started Ertapenem 500mg  IV  -- Hyrdalazine increased to 100mg   -- Chlorthalidone increased to 50mg   -- Stopped spironolactone 25mg     FOLLOW-UP:  Please attend all follow up appointment as scheduled.  -- You have a follow up with Nephrology on 02/15/23 and on 03/09/23  -- You have an appointment with Endocrinology on 02/19/23  -- You have an appointment with Urology on 03/01/23  -- You have an appointment with Infectious Disease on 03/01/23    The following information is for you Infectious Disease appointment regarding your current infection:  ?? Appointment: 03/01/23 at 1:30pm with Dr. [name]  ?? Location: 5th floor [employer]; [address] [place] [address]        Resources and Referrals    The below services have been ordered for you by your medical team to help your health and safety at home.    If Home Health services have been arranged, the agency listed below will be contacting you to set up a time for them to come see you in your home within 2 days of your discharge.  If you have not heard from them prior to 02/08/23 or you have any questions about home health, please contact them at the phone number listed below.    [facility] - Admitted Since 01/27/2023       Service Provider Selected Services Address Phone Fax Patient Preferred    [facility] [address] [place], [place] [address] [phone] --          [facility]  - Admitted Since 01/27/2023       Service Provider Selected Services Address Phone Fax Patient Preferred    [facility] [address] [place], [place] [address] [phone] --          Start of Care: 02/07/23    Please contact your Post-Transplant Coordinator if you have any problems/concerns:  Celene Squibb [phone]; fax/([phone]) [phone]              Follow Up instructions and Outpatient Referrals     Ambulatory Referral to Home Infusion      Performing location?: External    Home Health Requested Disciplines:  Nursing  Physical Therapy  Occupational Therapy        **  Please contact your service pharmacist for assistance with discharge   home health infusion monitoring.          Appointments which have been scheduled for you      Feb 15, 2023 11:30 AM  (Arrive by 11:20 AM)  RETURN HCP TELEPHONE with Eliezer Bottom, LCSW  [facility] KIDNEY TRANSPLANT [place] [facility] REGION) [address]  [place] [address]  [phone] Mar 01, 2023 1:00 PM  (Arrive by 12:45 PM)  RETURN GENERAL with Otis Dials, FNP  [facility] UROLOGY SERVICE [place] [facility] REGION) [address]  [facility] 1 through 4  [place] [address]  [phone]        Mar 01, 2023 1:30 PM  (Arrive by 1:15 PM)  RETURN ICID/TRANSPLANT with Audley Hose, MD  [place] INFECTIOUS DISEASES [place] [facility] REGION) [address]  [facility] 1 through 4  [place] [address]  [phone]        Mar 09, 2023 10:00 AM  (Arrive by 9:45 AM)  RETURN NEPHROLOGY POST with Leafy Half, MD  [place] [facility] KIDNEY TRANSPLANT [place] [facility] REGION) [address]  [facility] 1 through 4  [place] [address]  [phone]             ______________________________________________________________________  Discharge Day Services:  BP 129/38  - Pulse 60  - Temp 36.8 ??C (98.2 ??F) (Oral)  - Resp 18  - Ht 152.4 cm (5')  - Wt 54.4 kg (119 lb 14.9 oz)  - SpO2 99%  - BMI 23.42 kg/m??     Pt seen on the day of discharge and determined appropriate for discharge.    Condition at Discharge: stable    Length of Discharge: I spent greater than 30 mins in the discharge of this patient.    Jenita Seashore, DO  PGY-1 Internal Medicine

## 2023-02-06 NOTE — Unmapped (Signed)
[facility] INTERVENTIONAL RADIOLOGY - Operative Note     VIR Post-Procedure Note    Procedure Name: Tunneled left internal jugular single lumen powerline placement    Pre-Op Diagnosis: longterm IV antibiotics    Post-Op Diagnosis: Same as pre-operative diagnosis    VIR Providers    Attending Physician: Dr. Ivar Bury    Fellow/Resident: Rolin Barry, MD    Time out: Prior to the procedure, a time out was performed with all team members present. During the time out, the patient, procedure and procedure site when applicable were verbally verified by the team members Ivar Bury, MD and Rolin Barry, MD.    Description of procedure: Initial ultrasound evaluation of the right internal jugular vein showed occluded RIJ centrally, also questionable thrombus/occlusion of the RIJ/brachicephalic confluence so decision was made to place left sided line. placement of tunneled left internal jugular SL powerline    Sedation:Moderate sedation    Estimated Blood Loss: approximately <10 mL  Complications: None    See detailed procedure note with images in PACS (IMPAX).    The patient tolerated the procedure well without incident or complication and left the room in stable condition.    Rolin Barry, MD  02/06/2023 10:13 AM

## 2023-02-06 NOTE — Unmapped (Signed)
Pt aox4. LAVF negative for bruitt and thrills. Diet changed to regular. Pt will be NPO 7/16 @ [facility] for Tunneled line placement. LIJ with good blood return. ACHS. Will continue with POC.    Problem: Adult Inpatient Plan of Care  Goal: Plan of Care Review  Outcome: Ongoing - Unchanged  Goal: Patient-Specific Goal (Individualized)  Outcome: Ongoing - Unchanged  Goal: Absence of Hospital-Acquired Illness or Injury  Outcome: Ongoing - Unchanged  Intervention: Prevent Skin Injury  Recent Flowsheet Documentation  Taken 02/05/2023 0800 by Wells Guiles, RN  Positioning for Skin: Left  Goal: Optimal Comfort and Wellbeing  Outcome: Ongoing - Unchanged  Goal: Readiness for Transition of Care  Outcome: Ongoing - Unchanged  Goal: Rounds/Family Conference  Outcome: Ongoing - Unchanged     Problem: Fall Injury Risk  Goal: Absence of Fall and Fall-Related Injury  Outcome: Ongoing - Unchanged     Problem: Self-Care Deficit  Goal: Improved Ability to Complete Activities of Daily Living  Outcome: Ongoing - Unchanged     Problem: Skin Injury Risk Increased  Goal: Skin Health and Integrity  Outcome: Ongoing - Unchanged     Problem: Infection  Goal: Absence of Infection Signs and Symptoms  Outcome: Ongoing - Unchanged

## 2023-02-06 NOTE — Unmapped (Signed)
Problem: Adult Inpatient Plan of Care  Goal: Plan of Care Review  02/06/2023 1516 by Wells Guiles, RN  Outcome: Resolved  02/06/2023 1444 by Wells Guiles, RN  Outcome: Ongoing - Unchanged  Goal: Patient-Specific Goal (Individualized)  02/06/2023 1516 by Wells Guiles, RN  Outcome: Resolved  02/06/2023 1444 by Wells Guiles, RN  Outcome: Ongoing - Unchanged  Goal: Absence of Hospital-Acquired Illness or Injury  02/06/2023 1516 by Wells Guiles, RN  Outcome: Resolved  02/06/2023 1444 by Wells Guiles, RN  Outcome: Ongoing - Unchanged  Intervention: Identify and Manage Fall Risk  Recent Flowsheet Documentation  Taken 02/06/2023 0800 by Wells Guiles, RN  Safety Interventions:   family at bedside   lighting adjusted for tasks/safety   low bed  Intervention: Prevent Skin Injury  Recent Flowsheet Documentation  Taken 02/06/2023 0853 by Wells Guiles, RN  Positioning for Skin: Left  Intervention: Prevent Infection  Recent Flowsheet Documentation  Taken 02/06/2023 0800 by Wells Guiles, RN  Infection Prevention:   equipment surfaces disinfected   hand hygiene promoted  Goal: Optimal Comfort and Wellbeing  02/06/2023 1516 by Wells Guiles, RN  Outcome: Resolved  02/06/2023 1444 by Wells Guiles, RN  Outcome: Ongoing - Unchanged  Goal: Readiness for Transition of Care  02/06/2023 1516 by Wells Guiles, RN  Outcome: Resolved  02/06/2023 1444 by Wells Guiles, RN  Outcome: Ongoing - Unchanged  Goal: Rounds/Family Conference  02/06/2023 1516 by Wells Guiles, RN  Outcome: Resolved  02/06/2023 1444 by Wells Guiles, RN  Outcome: Ongoing - Unchanged     Problem: Fall Injury Risk  Goal: Absence of Fall and Fall-Related Injury  02/06/2023 1516 by Wells Guiles, RN  Outcome: Resolved  02/06/2023 1444 by Wells Guiles, RN  Outcome: Ongoing - Unchanged  Intervention: Promote Injury-Free Environment  Recent Flowsheet Documentation  Taken 02/06/2023 0800 by Wells Guiles, RN  Safety Interventions:   family at bedside   lighting adjusted for tasks/safety   low bed     Problem: Self-Care Deficit  Goal: Improved Ability to Complete Activities of Daily Living  02/06/2023 1516 by Wells Guiles, RN  Outcome: Resolved  02/06/2023 1444 by Wells Guiles, RN  Outcome: Ongoing - Unchanged     Problem: Skin Injury Risk Increased  Goal: Skin Health and Integrity  02/06/2023 1516 by Wells Guiles, RN  Outcome: Resolved  02/06/2023 1444 by Wells Guiles, RN  Outcome: Ongoing - Unchanged     Problem: Infection  Goal: Absence of Infection Signs and Symptoms  02/06/2023 1516 by Wells Guiles, RN  Outcome: Resolved  02/06/2023 1444 by Wells Guiles, RN  Outcome: Ongoing - Unchanged

## 2023-02-06 NOTE — Unmapped (Signed)
Transplant Nephrology Follow-Up Consult note      Assessment/Recommendations: Julie [name]  is a 72 y.o. 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     # Status post kidney transplant, Allograft Function (unstable):   DDKT 08/06/2022 2/2 to DM  KDPI: 56% CPRA 0  Follows with Dr. Elvera Maria   Baseline cr 0.9-1.1  Peaked at 1.97, down trending, improving, back to baseline  Adequate UO    #Fever/Septic shock  Ertapenem per ID for MDR UTI     #Perinephric fluid collection  CT abd pelvis w/o con . Similar size of the 7.4 x 1.0 x 5.4 cm crescentic fluid collection within the soft tissues lateral to the transplant kidney, previously 8.3 x 0.9 x 5.5 cm (2:84, 4:30). There is increased inflammatory changes and thickening involving the adjacent oblique and transversus abdominis musculature, similar to prior. Thickening of the adjacent peritoneum also appears similar to prior.   drained 10/10/2022 with no growth on culture  Does not appear to be infectious  Per ID likely transplanted kidney is source  Planned for R tunneled cath with 6 weeks or ertapenem    # Immunosuppression [High risk medical decision making for drug therapy requiring intensive monitoring for toxicity]  On home pred 5 mg daily, MMF 500 BID, tac BID  trough goal 5-7  Reassess tac dosing daily  Cont as per above    # Infectious Prophylaxis and Monitoring:   -CMV D+/R+, EBV D+/R+ - completed prophylaxis for all except PJP - can switch to atovaquone if tolerated for last week (end 02/04/2023)  - f/u PCR of both  - currently being treated for recurrent UTI (despite suppressive therapy) though fluid collection peri-transplant appears to be stable  - moderate risk for Hep B reactivation - LFTs, HBV DNA, HBsAg q 3 months    # Blood Pressure / Volume:  home regimen of coreg 25bid, chlorthalidone 25mg , hydralazine 50mg  bid, spironolactone 25mg  ,amlodipine 5mg  bid  Currently on hydralazine 50 mg bid, chlorthalidone 25 mg daily, carvedilol 25 mg BID and amlodpine 10 mg daily, spironolactone restarted    #Elevated pulse pressure  Significant low DBP 30-40s, PP 150s  CT abd pelvis w/o con 7/6 with significant burden of atherosclerotic cal of abd aorta and branches  likely in the setting of ESRD causing stiff vasculature.     #Normocytic Anemia  At baseline 8-9   Iron panel 12/29/2022 normal iron, iron sat, low tibc  Received EPO 10k 7/10      OVERALL RECOMMENDATIONS  Hold spironolactone  No need for IVF  Increase hydralazine to 100 mg tid for elevated BP >130/80  Low potassium diet    **Transplant patients with an open wound require wound care with sterile water only. The patient should be counseled on this at the time of discharge if they have not already been doing this.**     Patient was seen and discussed with attending nephrologist Dr. Vickie Epley. Recommendations were communicated to the primary team. Nephrology will continue to follow.    Thank you for this consult.   Please page the renal fellow on call with questions/concerns.    Donato Heinz, MD  Division of Nephrology and Hypertension  [facility]  02/05/2023      Transplant Background  Date of Transplant: 08/06/2022 (Kidney)  Type  of Transplant: DCD  KDPI: 56%  Cold ischemic time: 877 minutes (14 hr 37 min)  Warm ischemic time: 32 minutes  cPRA: 0%  HLA match:   Blood type: Donor A1, Recipient A POS  ID: CMV D+/R+, EBV D+/R+  Native Kidney Disease: Diabetes              Native kidney biopsy: Diabetic nephropathy (done elsewhere)              Pre-transplant dialysis course: PD started July 2017, transitioned to HD October 2019 due to catheter dysfunction/inadequate dialysis  Prior Transplants: None  Induction: Basiliximab  Early steroid withdrawal: No     Biopsies: None    ___________________________________________________________    Subjective/Interval History: Patient seen and examined at bedside in NAD. BP remains elevated with systolics above 200 on examination. Denies any headache, chest pain, shortness of breath, lightheadedness or dizziness.    Medications:   Current Facility-Administered Medications   Medication Dose Route Frequency Provider Last Rate Last Admin    acetaminophen (TYLENOL) tablet 650 mg  650 mg Oral Q8H PRN Mel Almond, MD        alteplase (ACTIVase) injection small catheter clearance 1 mg  1 mg Intravenous Once Mel Almond, MD        aluminum-magnesium hydroxide-simethicone (MAALOX MAX) 80-80-8 mg/mL oral suspension  30 mL Oral Q4H PRN Mel Almond, MD        amlodipine (NORVASC) tablet 10 mg  10 mg Oral Daily Mel Almond, MD   10 mg at 02/05/23 0814    atorvastatin (LIPITOR) tablet 10 mg  10 mg Oral Daily Mel Almond, MD   10 mg at 02/05/23 0813    carvedilol (COREG) tablet 25 mg  25 mg Oral BID Mel Almond, MD   25 mg at 02/05/23 0814    chlorthalidone (HYGROTON) tablet 50 mg  50 mg Oral Daily Jeannene Patella, MD   50 mg at 02/05/23 0813    cholecalciferol (vitamin D3 25 mcg (1,000 units)) tablet 50 mcg  50 mcg Oral Daily Mel Almond, MD   50 mcg at 02/05/23 0813    dextrose (D10W) 10% bolus 125 mL  12.5 g Intravenous Q10 Min PRN Mel Almond, MD        ertapenem [facility]) 500 mg in sodium chloride (NS) 0.9 % 50 mL IVPB  500 mg Intravenous Q24H Mel Almond, MD   Stopped at 02/05/23 0840    glucagon injection 1 mg  1 mg Intramuscular Once PRN Mel Almond, MD        glucose chewable tablet 16 g  16 g Oral Q10 Min PRN Mel Almond, MD        heparin (porcine) 5,000 unit/mL injection 5,000 Units  5,000 Units Subcutaneous Q8H [facility] Mel Almond, MD   5,000 Units at 02/05/23 1345    hydrALAZINE (APRESOLINE) tablet 100 mg  100 mg Oral Q8H [facility] Herrington, Kyler R, MD   100 mg at 02/05/23 1344    insulin glargine (LANTUS) injection 4 Units  4 Units Subcutaneous Nightly Mel Almond, MD   4 Units at 02/04/23 2047    insulin lispro (HumaLOG) injection 0-20 Units  0-20 Units Subcutaneous Eduard Clos, MD   2 Units at 02/05/23 1745    levothyroxine (SYNTHROID) tablet 112 mcg  112 mcg Oral daily Mel Almond, MD   112 mcg at 02/05/23 0541    melatonin tablet 3 mg  3 mg Oral Nightly PRN Mel Almond, MD   3 mg at  01/30/23 2101    mycophenolate (CELLCEPT) capsule 500 mg  500 mg Oral BID Mel Almond, MD   500 mg at 02/05/23 0814    ondansetron (ZOFRAN-ODT) disintegrating tablet 4 mg  4 mg Oral Q6H PRN Mel Almond, MD        predniSONE (DELTASONE) tablet 5 mg  5 mg Oral Daily Mel Almond, MD   5 mg at 02/05/23 5784    senna (SENOKOT) tablet 2 tablet  2 tablet Oral Nightly PRN Mel Almond, MD        tacrolimus (PROGRAF) capsule 1 mg  1 mg Oral BID Mel Almond, MD   1 mg at 02/05/23 0813        Physical Exam:  Vitals:    02/05/23 1400   BP: 163/42   Pulse: 66   Resp: 20   Temp: 36.6 ??C (97.9 ??F)   SpO2: 98%     I/O this shift:  In: 120 [P.O.:120]  Out: -     Intake/Output Summary (Last 24 hours) at 02/05/2023 1811  Last data filed at 02/05/2023 1030  Gross per 24 hour   Intake 370 ml   Output --   Net 370 ml     General: well-appearing, NAD  HEENT: conjunctival anicteric  CV: RRR nl s1/s2 no s3/s4 3/6 systolic murmur, no JVD, no LE edema  Lungs: nl WOB, CTAB w/o adventitious sounds  Abd: soft, non-tender, non-distended w/o r/g  Skin: well healed L sided transplant scar  Psych: alert, engaged, appropriate mood and affect  Neuro: normal speech, no gross focal deficits     LABS/IMAGING: Lab and imaging results from last 24 hrs noted and reviewed in EMR.

## 2023-02-06 NOTE — Unmapped (Addendum)
Pt discharging today. Discharge instructions given to pt and pt's husband at the bedside. Both parties acknowledge discharge instructions and all questions and concerns answered appropriately. Pt has new tunneled venous central line to right left chest. Site is CDI. Primary team removed LIJ. Medications to be filled at [facility] pharmacy and delivered to patient at the bedside. VSS.       Problem: Adult Inpatient Plan of Care  Goal: Plan of Care Review  02/06/2023 1444 by Wells Guiles, RN  Outcome: Ongoing - Unchanged  02/06/2023 1444 by Wells Guiles, RN  Outcome: Ongoing - Unchanged  Goal: Patient-Specific Goal (Individualized)  02/06/2023 1444 by Wells Guiles, RN  Outcome: Ongoing - Unchanged  02/06/2023 1444 by Wells Guiles, RN  Outcome: Ongoing - Unchanged  Goal: Absence of Hospital-Acquired Illness or Injury  02/06/2023 1444 by Wells Guiles, RN  Outcome: Ongoing - Unchanged  02/06/2023 1444 by Wells Guiles, RN  Outcome: Ongoing - Unchanged  Intervention: Identify and Manage Fall Risk  Recent Flowsheet Documentation  Taken 02/06/2023 0800 by Wells Guiles, RN  Safety Interventions:   family at bedside   lighting adjusted for tasks/safety   low bed  Intervention: Prevent Skin Injury  Recent Flowsheet Documentation  Taken 02/06/2023 0853 by Wells Guiles, RN  Positioning for Skin: Left  Intervention: Prevent Infection  Recent Flowsheet Documentation  Taken 02/06/2023 0800 by Wells Guiles, RN  Infection Prevention:   equipment surfaces disinfected   hand hygiene promoted  Goal: Optimal Comfort and Wellbeing  02/06/2023 1444 by Wells Guiles, RN  Outcome: Ongoing - Unchanged  02/06/2023 1444 by Wells Guiles, RN  Outcome: Ongoing - Unchanged  Goal: Readiness for Transition of Care  02/06/2023 1444 by Wells Guiles, RN  Outcome: Ongoing - Unchanged  02/06/2023 1444 by Wells Guiles, RN  Outcome: Ongoing - Unchanged  Goal: Rounds/Family Conference  02/06/2023 1444 by Wells Guiles, RN  Outcome: Ongoing - Unchanged  02/06/2023 1444 by Wells Guiles, RN  Outcome: Ongoing - Unchanged     Problem: Fall Injury Risk  Goal: Absence of Fall and Fall-Related Injury  02/06/2023 1444 by Wells Guiles, RN  Outcome: Ongoing - Unchanged  02/06/2023 1444 by Wells Guiles, RN  Outcome: Ongoing - Unchanged  Intervention: Promote Injury-Free Environment  Recent Flowsheet Documentation  Taken 02/06/2023 0800 by Wells Guiles, RN  Safety Interventions:   family at bedside   lighting adjusted for tasks/safety   low bed     Problem: Self-Care Deficit  Goal: Improved Ability to Complete Activities of Daily Living  02/06/2023 1444 by Wells Guiles, RN  Outcome: Ongoing - Unchanged  02/06/2023 1444 by Wells Guiles, RN  Outcome: Ongoing - Unchanged     Problem: Skin Injury Risk Increased  Goal: Skin Health and Integrity  02/06/2023 1444 by Wells Guiles, RN  Outcome: Ongoing - Unchanged  02/06/2023 1444 by Wells Guiles, RN  Outcome: Ongoing - Unchanged     Problem: Infection  Goal: Absence of Infection Signs and Symptoms  02/06/2023 1444 by Wells Guiles, RN  Outcome: Ongoing - Unchanged  02/06/2023 1444 by Wells Guiles, RN  Outcome: Ongoing - Unchanged

## 2023-02-06 NOTE — Unmapped (Signed)
No acute events.  Pt tolerated scheduled meds.  Spouse at bedside.  No sliding scale needed this shift.  Due to pt's low MAP, coreg and hydralazine were not given.  MD made aware.  POC continues.

## 2023-02-07 DIAGNOSIS — R7881 Bacteremia: Secondary | ICD-10-CM | POA: Diagnosis not present

## 2023-02-07 DIAGNOSIS — A041 Enterotoxigenic Escherichia coli infection: Secondary | ICD-10-CM | POA: Diagnosis not present

## 2023-02-08 DIAGNOSIS — I509 Heart failure, unspecified: Secondary | ICD-10-CM | POA: Diagnosis not present

## 2023-02-08 DIAGNOSIS — B9620 Unspecified Escherichia coli [E. coli] as the cause of diseases classified elsewhere: Secondary | ICD-10-CM | POA: Diagnosis not present

## 2023-02-08 DIAGNOSIS — E1122 Type 2 diabetes mellitus with diabetic chronic kidney disease: Secondary | ICD-10-CM | POA: Diagnosis not present

## 2023-02-08 DIAGNOSIS — N12 Tubulo-interstitial nephritis, not specified as acute or chronic: Secondary | ICD-10-CM | POA: Diagnosis not present

## 2023-02-08 DIAGNOSIS — T8613 Kidney transplant infection: Secondary | ICD-10-CM | POA: Diagnosis not present

## 2023-02-08 DIAGNOSIS — N189 Chronic kidney disease, unspecified: Secondary | ICD-10-CM | POA: Diagnosis not present

## 2023-02-08 DIAGNOSIS — D631 Anemia in chronic kidney disease: Secondary | ICD-10-CM | POA: Diagnosis not present

## 2023-02-08 DIAGNOSIS — G9341 Metabolic encephalopathy: Secondary | ICD-10-CM | POA: Diagnosis not present

## 2023-02-08 DIAGNOSIS — I110 Hypertensive heart disease with heart failure: Secondary | ICD-10-CM | POA: Diagnosis not present

## 2023-02-08 NOTE — Unmapped (Signed)
Called to check in with patient after recent discharge with home infusion.  Pts spouse reports he was having difficulty with her line last evening and she missed her dose of ertapenem.  He reports the [facility] nurse is coming today to assist.  He will call this TNC if the [facility] nurse is not able to assist with the line.  Pt will get labs drawn on Monday as he forgot to take her today.

## 2023-02-08 NOTE — Unmapped (Signed)
error 

## 2023-02-12 DIAGNOSIS — Z940 Kidney transplant status: Secondary | ICD-10-CM | POA: Diagnosis not present

## 2023-02-12 LAB — CBC W/ DIFFERENTIAL
BANDED NEUTROPHILS ABSOLUTE COUNT: 0 10*3/uL (ref 0.0–0.1)
BASOPHILS ABSOLUTE COUNT: 0 10*3/uL (ref 0.0–0.2)
BASOPHILS RELATIVE PERCENT: 0 %
EOSINOPHILS ABSOLUTE COUNT: 0 10*3/uL (ref 0.0–0.4)
EOSINOPHILS RELATIVE PERCENT: 1 %
HEMATOCRIT: 27.5 % — ABNORMAL LOW (ref 34.0–46.6)
HEMOGLOBIN: 8.7 g/dL — ABNORMAL LOW (ref 11.1–15.9)
IMMATURE GRANULOCYTES: 1 %
LYMPHOCYTES ABSOLUTE COUNT: 0.8 10*3/uL (ref 0.7–3.1)
LYMPHOCYTES RELATIVE PERCENT: 16 %
MEAN CORPUSCULAR HEMOGLOBIN CONC: 31.6 g/dL (ref 31.5–35.7)
MEAN CORPUSCULAR HEMOGLOBIN: 31.4 pg (ref 26.6–33.0)
MEAN CORPUSCULAR VOLUME: 99 fL — ABNORMAL HIGH (ref 79–97)
MONOCYTES ABSOLUTE COUNT: 0.4 10*3/uL (ref 0.1–0.9)
MONOCYTES RELATIVE PERCENT: 8 %
NEUTROPHILS ABSOLUTE COUNT: 4 10*3/uL (ref 1.4–7.0)
NEUTROPHILS RELATIVE PERCENT: 74 %
PLATELET COUNT: 141 10*3/uL — ABNORMAL LOW (ref 150–450)
RED BLOOD CELL COUNT: 2.77 x10E6/uL — ABNORMAL LOW (ref 3.77–5.28)
RED CELL DISTRIBUTION WIDTH: 13.2 % (ref 11.7–15.4)
WHITE BLOOD CELL COUNT: 5.4 10*3/uL (ref 3.4–10.8)

## 2023-02-12 LAB — RENAL FUNCTION PANEL
ALBUMIN: 3.9 g/dL (ref 3.8–4.8)
BLOOD UREA NITROGEN: 41 mg/dL — ABNORMAL HIGH (ref 8–27)
BUN / CREAT RATIO: 32 — ABNORMAL HIGH (ref 12–28)
CALCIUM: 8.8 mg/dL (ref 8.7–10.3)
CHLORIDE: 108 mmol/L — ABNORMAL HIGH (ref 96–106)
CO2: 20 mmol/L (ref 20–29)
CREATININE: 1.29 mg/dL — ABNORMAL HIGH (ref 0.57–1.00)
EGFR: 44 mL/min/{1.73_m2} — ABNORMAL LOW
GLUCOSE: 189 mg/dL — ABNORMAL HIGH (ref 70–99)
PHOSPHORUS, SERUM: 3.5 mg/dL (ref 3.0–4.3)
POTASSIUM: 4.9 mmol/L (ref 3.5–5.2)
SODIUM: 144 mmol/L (ref 134–144)

## 2023-02-12 LAB — MAGNESIUM: MAGNESIUM: 1.9 mg/dL (ref 1.6–2.3)

## 2023-02-12 NOTE — Unmapped (Signed)
UNOS form

## 2023-02-13 LAB — TACROLIMUS LEVEL: TACROLIMUS BLOOD: 5.6 ng/mL (ref 2.0–20.0)

## 2023-02-14 DIAGNOSIS — T8613 Kidney transplant infection: Secondary | ICD-10-CM | POA: Diagnosis not present

## 2023-02-14 DIAGNOSIS — B9620 Unspecified Escherichia coli [E. coli] as the cause of diseases classified elsewhere: Secondary | ICD-10-CM | POA: Diagnosis not present

## 2023-02-14 DIAGNOSIS — R7881 Bacteremia: Secondary | ICD-10-CM | POA: Diagnosis not present

## 2023-02-14 DIAGNOSIS — N12 Tubulo-interstitial nephritis, not specified as acute or chronic: Secondary | ICD-10-CM | POA: Diagnosis not present

## 2023-02-14 DIAGNOSIS — D631 Anemia in chronic kidney disease: Secondary | ICD-10-CM | POA: Diagnosis not present

## 2023-02-14 DIAGNOSIS — A041 Enterotoxigenic Escherichia coli infection: Secondary | ICD-10-CM | POA: Diagnosis not present

## 2023-02-14 DIAGNOSIS — N189 Chronic kidney disease, unspecified: Secondary | ICD-10-CM | POA: Diagnosis not present

## 2023-02-14 DIAGNOSIS — E1122 Type 2 diabetes mellitus with diabetic chronic kidney disease: Secondary | ICD-10-CM | POA: Diagnosis not present

## 2023-02-14 DIAGNOSIS — G9341 Metabolic encephalopathy: Secondary | ICD-10-CM | POA: Diagnosis not present

## 2023-02-14 DIAGNOSIS — I509 Heart failure, unspecified: Secondary | ICD-10-CM | POA: Diagnosis not present

## 2023-02-14 DIAGNOSIS — I110 Hypertensive heart disease with heart failure: Secondary | ICD-10-CM | POA: Diagnosis not present

## 2023-02-14 MED ORDER — TACROLIMUS 0.5 MG CAPSULE, IMMEDIATE-RELEASE
ORAL_CAPSULE | ORAL | 11 refills | 30 days | Status: CP
Start: 2023-02-14 — End: 2024-02-14

## 2023-02-14 NOTE — Unmapped (Signed)
Immunocompromised Host Infectious Diseases Complex Outpatient Antimicrobial Therapy (COpAT) Service    Julie [name]  is a 72 y.o. female followed by ICID COpAT program for transplant pyelonephritis.  Allergies: Gabapentin and Valacyclovir  Microbiology:   12/25/22 - Urine culture - Clean catch; Escherichia coli (S: cefepime, erta, gent, mero, nitro, tobra -- R: amp, aztreo, cefazolin, ceftriax, cephalexin, ciproflox, levoflox, tet, TMP/SMX -- I: ceftaz)  Antimicrobial regimen: ertapenem 500mg  q24h  Antimicrobial start date: 01/28/23  Antimicrobial end date (contingent upon): 08/17-09/01/24, to be confirmed  Line Access (date of placement): [facility] Powerline LIJ, 02/06/23    Relevant results:    Lab Results   Component Value Date    WBC 5.4 02/12/2023    WBC 4.7 02/06/2023    WBC 4.0 02/05/2023    NEUTROABS 4.0 02/12/2023    NEUTROABS 3.1 02/06/2023    NEUTROABS 2.6 02/05/2023    EOSABS 0.0 02/12/2023    EOSABS 0.1 02/06/2023    EOSABS 0.1 02/05/2023    PLT 141 (L) 02/12/2023    PLT 212 02/06/2023    PLT 194 02/05/2023     Lab Results   Component Value Date    BUN 41 (H) 02/12/2023    BUN 25 (H) 02/06/2023    BUN 25 (H) 02/05/2023    CREATININE 1.29 (H) 02/12/2023    CREATININE 1.29 (H) 02/06/2023    CREATININE 1.36 (H) 02/05/2023     Lab Results   Component Value Date    ALBUMIN 3.9 02/12/2023    ALBUMIN 3.0 (L) 02/03/2023    ALBUMIN 2.8 (L) 02/01/2023     Lab Results   Component Value Date    ALKPHOS 57 02/03/2023    ALKPHOS 43 (L) 01/28/2023    ALKPHOS 57 01/27/2023    BILITOT 0.2 (L) 02/03/2023    BILITOT 0.4 01/28/2023    BILITOT 0.6 01/27/2023    ALT <7 (L) 02/03/2023    ALT 8 (L) 01/28/2023    ALT 11 01/27/2023    AST 11 02/03/2023    AST 11 01/28/2023    AST 12 01/27/2023     Assessment:  Scr stable at ~1.3 mg/dL. Est CrCl 28-33 ml/min. Dose appropriate.   WBC, ABS NEUTRO, ABS EOS WNL. Slightly low Plt. Monitor.   CMP not collected. Has been WNL prior.    Laboratory Monitoring:   CBC w/differential once weekly.  CMP once weekly.     Next scheduled labs: week of 02/19/23    Next ICID appointment: 03/01/23 with [name]    Action taken:  1. Continue ertapenem 500mg  q24h.  2. Time spent on documentation and coordination of care: 10 minutes    Tracey Harries, CPP  ICID COpAT Program  Phone: [phone]  Fax:  [phone]

## 2023-02-14 NOTE — Unmapped (Signed)
Reviewed recent tac level with Knox Saliva PharmD, pt advised to increase tac dose to 1.5 mg ( 3 capsules ) in the am and 1 mg ( 2 capsules ) in the PM.  Spoke tp pts spouse Chase Picket who verbalized understanding.  Julie [name] instructed to change dose on med list and also in pill box.  Reviewed upcoming appts with Chase Picket and pt will repeat labs next week.

## 2023-02-15 ENCOUNTER — Ambulatory Visit: Admit: 2023-02-15 | Discharge: 2023-02-16 | Payer: MEDICARE

## 2023-02-15 NOTE — Unmapped (Unsigned)
KIDNEY POST-TRANSPLANT ASSESSMENT   Clinical Social Worker Telephone Note    Name:Julie [name]  Date of Birth:01-Nov-1950  ZOX:[id number]    REFERRAL INFORMATION:    Julie [name] is s/p transplant for kidney transplantation . CSW follows up to assess general check-in.    PREFERRED LANGUAGE: English/Korean    INTERPRETER UTILIZED: N/A    TRANSPLANT DATE:   08/06/2022 (Kidney)    POST TXP RN COORDINATOR:   Celene Squibb [phone]; [phone]    SUMMARY:  Called pt at appt time today for general check in.  Spouse/Julie [name] available via speaker phone.  Pt states that she is doing well w/ HH and HI.  Feels labs are good.  No specific issues/concerns at this time.    F/up q 3-4 weeks for general monitoring.      Julie Petties, LCSW, CCTSW  Transplant Case Manager  [facility]  03/19/2023 further issues due to hx of confusion, low health literacy, etc.  Julie [name]/HH agreed to call back to spouse on his direct line and to reopen record.      No further issues noted at this time.    F/up q 3-4 weeks for general monitoring.      Julie Petties, LCSW, CCTSW  Transplant Case Manager  [facility]  02/15/2023      FEELING OK... HAVE ANOTHER 21 DAYS OF ABX...    THINK EVERYTHING IS OK... SPOUSE IS OK...    F/up q 6 weeks    ___________________________  02/15/23  In car w/ spouse...     Doing well w/ HH and HI...    Labs going well...    HH and HI going smoother...     Out on Sunday?      [facility] delivery?  Julie [name] was going to call and check...     OK for now.Marland KitchenMarland Kitchen

## 2023-02-17 DIAGNOSIS — A041 Enterotoxigenic Escherichia coli infection: Secondary | ICD-10-CM | POA: Diagnosis not present

## 2023-02-17 DIAGNOSIS — R7881 Bacteremia: Secondary | ICD-10-CM | POA: Diagnosis not present

## 2023-02-19 DIAGNOSIS — E1121 Type 2 diabetes mellitus with diabetic nephropathy: Secondary | ICD-10-CM | POA: Diagnosis not present

## 2023-02-19 DIAGNOSIS — Z794 Long term (current) use of insulin: Secondary | ICD-10-CM | POA: Diagnosis not present

## 2023-02-19 DIAGNOSIS — E162 Hypoglycemia, unspecified: Secondary | ICD-10-CM | POA: Diagnosis not present

## 2023-02-19 DIAGNOSIS — Z940 Kidney transplant status: Secondary | ICD-10-CM | POA: Diagnosis not present

## 2023-02-19 DIAGNOSIS — N1831 Chronic kidney disease, stage 3a: Secondary | ICD-10-CM | POA: Diagnosis not present

## 2023-02-19 LAB — CBC W/ DIFFERENTIAL
BANDED NEUTROPHILS ABSOLUTE COUNT: 0 10*3/uL (ref 0.0–0.1)
BASOPHILS ABSOLUTE COUNT: 0 10*3/uL (ref 0.0–0.2)
BASOPHILS RELATIVE PERCENT: 0 %
EOSINOPHILS ABSOLUTE COUNT: 0.1 10*3/uL (ref 0.0–0.4)
EOSINOPHILS RELATIVE PERCENT: 2 %
HEMATOCRIT: 31 % — ABNORMAL LOW (ref 34.0–46.6)
HEMOGLOBIN: 9.6 g/dL — ABNORMAL LOW (ref 11.1–15.9)
IMMATURE GRANULOCYTES: 0 %
LYMPHOCYTES ABSOLUTE COUNT: 1 10*3/uL (ref 0.7–3.1)
LYMPHOCYTES RELATIVE PERCENT: 25 %
MEAN CORPUSCULAR HEMOGLOBIN CONC: 31 g/dL — ABNORMAL LOW (ref 31.5–35.7)
MEAN CORPUSCULAR HEMOGLOBIN: 30.7 pg (ref 26.6–33.0)
MEAN CORPUSCULAR VOLUME: 99 fL — ABNORMAL HIGH (ref 79–97)
MONOCYTES ABSOLUTE COUNT: 0.4 10*3/uL (ref 0.1–0.9)
MONOCYTES RELATIVE PERCENT: 11 %
NEUTROPHILS ABSOLUTE COUNT: 2.5 10*3/uL (ref 1.4–7.0)
NEUTROPHILS RELATIVE PERCENT: 62 %
PLATELET COUNT: 117 10*3/uL — ABNORMAL LOW (ref 150–450)
RED BLOOD CELL COUNT: 3.13 x10E6/uL — ABNORMAL LOW (ref 3.77–5.28)
RED CELL DISTRIBUTION WIDTH: 13.1 % (ref 11.7–15.4)
WHITE BLOOD CELL COUNT: 4 10*3/uL (ref 3.4–10.8)

## 2023-02-19 NOTE — Unmapped (Signed)
Phone conversation with Dr. Ronald Lobo, her endocrinologist. She is concerned patient not taking prandial insulin correctly; considering once-daily basal insulin plus an oral agent. We discussed SGLT2i; due to patient's recurrent episodes of sepsis of urinary origin (maybe related to the perinephric fluid collection) I don't feel comfortable with SGLT2i at this time. Dr. Standley Brooking will explore other options, perhaps metformin or linagliptin.    I do think belatacept would benefit this patient (help with BP, cognition) but transportation to infusions has been a barrier; will discuss at upcoming appt 8/16.

## 2023-02-20 LAB — RENAL FUNCTION PANEL
ALBUMIN: 4 g/dL (ref 3.8–4.8)
BLOOD UREA NITROGEN: 31 mg/dL — ABNORMAL HIGH (ref 8–27)
BUN / CREAT RATIO: 23 (ref 12–28)
CALCIUM: 8.8 mg/dL (ref 8.7–10.3)
CHLORIDE: 111 mmol/L — ABNORMAL HIGH (ref 96–106)
CO2: 18 mmol/L — ABNORMAL LOW (ref 20–29)
CREATININE: 1.35 mg/dL — ABNORMAL HIGH (ref 0.57–1.00)
EGFR: 41 mL/min/{1.73_m2} — ABNORMAL LOW
GLUCOSE: 86 mg/dL (ref 70–99)
PHOSPHORUS, SERUM: 3.7 mg/dL (ref 3.0–4.3)
POTASSIUM: 4.7 mmol/L (ref 3.5–5.2)
SODIUM: 146 mmol/L — ABNORMAL HIGH (ref 134–144)

## 2023-02-20 LAB — MAGNESIUM: MAGNESIUM: 1.9 mg/dL (ref 1.6–2.3)

## 2023-02-21 DIAGNOSIS — B9620 Unspecified Escherichia coli [E. coli] as the cause of diseases classified elsewhere: Secondary | ICD-10-CM | POA: Diagnosis not present

## 2023-02-21 DIAGNOSIS — G9341 Metabolic encephalopathy: Secondary | ICD-10-CM | POA: Diagnosis not present

## 2023-02-21 DIAGNOSIS — E1122 Type 2 diabetes mellitus with diabetic chronic kidney disease: Secondary | ICD-10-CM | POA: Diagnosis not present

## 2023-02-21 DIAGNOSIS — T8613 Kidney transplant infection: Secondary | ICD-10-CM | POA: Diagnosis not present

## 2023-02-21 DIAGNOSIS — D631 Anemia in chronic kidney disease: Secondary | ICD-10-CM | POA: Diagnosis not present

## 2023-02-21 DIAGNOSIS — I110 Hypertensive heart disease with heart failure: Secondary | ICD-10-CM | POA: Diagnosis not present

## 2023-02-21 DIAGNOSIS — I509 Heart failure, unspecified: Secondary | ICD-10-CM | POA: Diagnosis not present

## 2023-02-21 DIAGNOSIS — N189 Chronic kidney disease, unspecified: Secondary | ICD-10-CM | POA: Diagnosis not present

## 2023-02-21 DIAGNOSIS — N12 Tubulo-interstitial nephritis, not specified as acute or chronic: Secondary | ICD-10-CM | POA: Diagnosis not present

## 2023-02-21 LAB — TACROLIMUS LEVEL: TACROLIMUS BLOOD: 5.6 ng/mL (ref 2.0–20.0)

## 2023-02-21 NOTE — Unmapped (Signed)
Immunocompromised Host Infectious Diseases Complex Outpatient Antimicrobial Therapy (COpAT) Service    Julie [name]  is a 72 y.o. female followed by ICID COpAT program for transplant pyelonephritis.  Allergies: Gabapentin and Valacyclovir  Microbiology:   12/25/22 - Urine culture - Clean catch; Escherichia coli (S: cefepime, erta, gent, mero, nitro, tobra -- R: amp, aztreo, cefazolin, ceftriax, cephalexin, ciproflox, levoflox, tet, TMP/SMX -- I: ceftaz)  Antimicrobial regimen: ertapenem 500mg  q24h  Antimicrobial start date: 01/28/23  Antimicrobial end date (contingent upon): 08/17-09/01/24, to be confirmed  Line Access (date of placement): [facility] Powerline LIJ, 02/06/23    Relevant results:    Lab Results   Component Value Date    WBC 4.0 02/19/2023    WBC 5.4 02/12/2023    WBC 4.7 02/06/2023    NEUTROABS 2.5 02/19/2023    NEUTROABS 4.0 02/12/2023    NEUTROABS 3.1 02/06/2023    EOSABS 0.1 02/19/2023    EOSABS 0.0 02/12/2023    EOSABS 0.1 02/06/2023    PLT 117 (L) 02/19/2023    PLT 141 (L) 02/12/2023    PLT 212 02/06/2023     Lab Results   Component Value Date    BUN 31 (H) 02/19/2023    BUN 41 (H) 02/12/2023    BUN 25 (H) 02/06/2023    CREATININE 1.35 (H) 02/19/2023    CREATININE 1.29 (H) 02/12/2023    CREATININE 1.29 (H) 02/06/2023     Lab Results   Component Value Date    ALBUMIN 4.0 02/19/2023    ALBUMIN 3.9 02/12/2023    ALBUMIN 3.0 (L) 02/03/2023     Lab Results   Component Value Date    ALKPHOS 57 02/03/2023    ALKPHOS 43 (L) 01/28/2023    ALKPHOS 57 01/27/2023    BILITOT 0.2 (L) 02/03/2023    BILITOT 0.4 01/28/2023    BILITOT 0.6 01/27/2023    ALT <7 (L) 02/03/2023    ALT 8 (L) 01/28/2023    ALT 11 01/27/2023    AST 11 02/03/2023    AST 11 01/28/2023    AST 12 01/27/2023     Assessment:  Scr stable at ~1.3 mg/dL. Est CrCl 26-30 ml/min. Dose appropriate.   WBC, ABS NEUTRO, ABS EOS WNL. Slightly low Plt. Monitor.   CMP not collected. Has been WNL prior. Contacted home infusion / home health again to update orders.    Laboratory Monitoring:   CBC w/differential once weekly.  CMP once weekly.     Next scheduled labs: week of 02/26/23    Next ICID appointment: 03/01/23 with [name]    Action taken:  1. Continue ertapenem 500mg  q24h.  2. Time spent on documentation and coordination of care: 10 minutes    Tracey Harries, CPP  ICID COpAT Program  Phone: [phone]  Fax:  [phone]

## 2023-02-22 DIAGNOSIS — Z79899 Other long term (current) drug therapy: Principal | ICD-10-CM

## 2023-02-22 DIAGNOSIS — B9620 Unspecified Escherichia coli [E. coli] as the cause of diseases classified elsewhere: Principal | ICD-10-CM

## 2023-02-22 DIAGNOSIS — R7881 Bacteremia: Principal | ICD-10-CM

## 2023-02-22 DIAGNOSIS — Z940 Kidney transplant status: Principal | ICD-10-CM

## 2023-02-22 MED ORDER — TACROLIMUS 0.5 MG CAPSULE, IMMEDIATE-RELEASE
ORAL_CAPSULE | Freq: Two times a day (BID) | ORAL | 3 refills | 90 days | Status: CP
Start: 2023-02-22 — End: 2024-02-22

## 2023-02-22 NOTE — Unmapped (Signed)
Reviewed recent labs and tac level with Knox Saliva PharmD, pt advised to increase tac dose to 1.5 mg bid ( 3 capsules total)  Pts spouse Chase Picket verbalized understanding.  Pt will repeat labs next week when she has her urology and ICID appt.

## 2023-02-23 NOTE — Unmapped (Signed)
The [facility] has made a second and final attempt to reach this patient to refill the following medication:mycophenolate.      We have left voicemails on the following phone numbers: [phone] and [phone] .    Dates contacted: 02/08/23 and 02/16/23  Last scheduled delivery: 10/25/22    The patient may be at risk of non-compliance with this medication. The patient should call the [facility] at [phone]  Option 4, then Option 4: Infectious Disease, Transplant to refill medication.    Quintella Reichert   [facility] Specialty Technician

## 2023-02-24 DIAGNOSIS — R7881 Bacteremia: Secondary | ICD-10-CM | POA: Diagnosis not present

## 2023-02-24 DIAGNOSIS — A041 Enterotoxigenic Escherichia coli infection: Secondary | ICD-10-CM | POA: Diagnosis not present

## 2023-02-28 DIAGNOSIS — Z940 Kidney transplant status: Secondary | ICD-10-CM | POA: Diagnosis not present

## 2023-02-28 DIAGNOSIS — N189 Chronic kidney disease, unspecified: Secondary | ICD-10-CM | POA: Diagnosis not present

## 2023-02-28 DIAGNOSIS — I110 Hypertensive heart disease with heart failure: Secondary | ICD-10-CM | POA: Diagnosis not present

## 2023-02-28 DIAGNOSIS — D631 Anemia in chronic kidney disease: Secondary | ICD-10-CM | POA: Diagnosis not present

## 2023-02-28 DIAGNOSIS — B9620 Unspecified Escherichia coli [E. coli] as the cause of diseases classified elsewhere: Secondary | ICD-10-CM | POA: Diagnosis not present

## 2023-02-28 DIAGNOSIS — G9341 Metabolic encephalopathy: Secondary | ICD-10-CM | POA: Diagnosis not present

## 2023-02-28 DIAGNOSIS — N12 Tubulo-interstitial nephritis, not specified as acute or chronic: Secondary | ICD-10-CM | POA: Diagnosis not present

## 2023-02-28 DIAGNOSIS — E1122 Type 2 diabetes mellitus with diabetic chronic kidney disease: Secondary | ICD-10-CM | POA: Diagnosis not present

## 2023-02-28 DIAGNOSIS — T8613 Kidney transplant infection: Secondary | ICD-10-CM | POA: Diagnosis not present

## 2023-02-28 DIAGNOSIS — I509 Heart failure, unspecified: Secondary | ICD-10-CM | POA: Diagnosis not present

## 2023-02-28 LAB — RENAL FUNCTION PANEL
ALBUMIN: 4 g/dL (ref 3.8–4.8)
BLOOD UREA NITROGEN: 34 mg/dL — ABNORMAL HIGH (ref 8–27)
BUN / CREAT RATIO: 24 (ref 12–28)
CALCIUM: 8.9 mg/dL (ref 8.7–10.3)
CHLORIDE: 108 mmol/L — ABNORMAL HIGH (ref 96–106)
CO2: 23 mmol/L (ref 20–29)
CREATININE: 1.39 mg/dL — ABNORMAL HIGH (ref 0.57–1.00)
EGFR: 40 mL/min/{1.73_m2} — ABNORMAL LOW
GLUCOSE: 97 mg/dL (ref 70–99)
PHOSPHORUS, SERUM: 3.4 mg/dL (ref 3.0–4.3)
POTASSIUM: 4.7 mmol/L (ref 3.5–5.2)
SODIUM: 142 mmol/L (ref 134–144)

## 2023-02-28 LAB — MAGNESIUM: MAGNESIUM: 1.6 mg/dL (ref 1.6–2.3)

## 2023-02-28 NOTE — Unmapped (Unsigned)
IMMUNOCOMPROMISED HOST INFECTIOUS DISEASE PROGRESS NOTE    Assessment/Plan:     [name] is a 72 y.o. 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 Had perinephric drain in place postoperatively, removed 08/30/22  - Immunosuppression: Tacrolimus (goal 5-7), MMF 500 BID (reduced 250mg  bid on 7/6) Pred 5  - Prophylaxis: valganciclovir x 3 months (mod risk, completed), TMP+SMX x 6 months (completed)  - CrCl cannot be calculated (Unknown ideal weight.).       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 Dubliensis 08/05/22, <10K, negative recipient samples, negative donor blood cx, treated with 2 weeks PO fluconazole  # Positive Hep B sAgb and core antibodies, c/w resolved infection, with moderate risk for reactivation 02/2021  - 12/28/22 HBV DNA not detected and HBsAg negative  # Recurrent MDR E.Coli Bacteremia/UTI  - 1/29 BCx/UCx E. Coli - s/p Levo x 3 weeks (thru stent removal, 3/15) (S: Cefazolin, Cipro/Levo, Pip-tazo, Cephalexin; R: TMP-SMX, Amp, Amp-sulb)  - 3/14 BCx/UCx ESBL/FQ-R E. Coli (S: Erta, Gent, Mero, Tobra, Cefepime SDD; I: ceftaz; R: Cipro/Levo, Cefazolin, Ceftriaxone, Amp) s/p 14 days Meropenem   - 4/13 Bcx/Ucx E. Coli (R CFZ, CTX, cipro/levo, I ceftaz, SDD CFP, susc nitro) s/p mero x 2 weeks  - 5/7 seen by urogyn, no structural issues planned to stop nitrofurantoin with switch to methenamine (not done), added vaginal estrogen/d-mannose.  - 6/3 UCx <10,000 E. Coli (S: Cefepime SDD; Erta/Mero, Gent, Nitrofurantoin, Tobra) s/p ertapenem until 01/15/23  - 4/29 started nitrofurantoin prophylaxis; not restarted after 6/24 due to reduced GFR and breakthrough in 12/2022     Active infection:  #Perinephric fluid collection 10/05/22, slowly improving 01/27/23  - 3/14 BCx/UCx ESBL/FQ-R E. Coli (S: Erta, Gent, Mero, Tobra, Cefepime SDD; I: ceftaz; R: Cipro/Levo, Cefazolin, Ceftriaxone, Amp) s/p 14 days Meropenem   - 3/19 IR guided drainage negative cx, Cr 1.5 not suggestive of urinoma  - 4/13 increased stranding, 4/16 not amenable to intervention per VIR  - 01/27/23 CT a/p 7.4 x 1.0 x 5.4 cm crescentic fluid collection within the soft tissues lateral to the transplant kidney (8.3 x 0.9 x 5.5 cm on 11/05/22)  - 7/10 VIR: collection not large enough to aspirate     # Fever and septic shock, presumed urinary source given pyuria and recurrent UTI history 01/27/23  - 7/6 3x emesis in the ED with sudden acute change in mental status, prompting intubation for airway protection --> extubated 7/7  - 7/6 flu/RSV/covid neg; UCx <1,000 CFU (appears on plates as E. Coli); BCx (NG)  - 7/6  CXR more likely edema or aspiration then pneumonia  - 7/6 CT A/P: Bibasilar consolidation. No hydronephrosis. No solid renal mass. Atrophic bilateral native kidneys. Similar appearance of nonobstructive punctate calculi and vascular calcifications. Subcentimeter hypoattenuating lesions within the renal transplant, too small to characterize. No hydronephrosis. Similar inflammatory stranding surrounding the transplant kidney, particularly around the hilum. 7.4 x 1.0 x 5.4 cm crescentic fluid collection within the soft tissues lateral to the transplant kidney (8.3 x 0.9 x 5.5 cm on 11/05/22)  - 7/7 renal transplant [place]: The renal transplant was located in the left lower quadrant. Normal size and echogenicity. 2 subcentimeter anechoic cysts are present. There is pelvic caliectasis.   Rx 7/6 vanc/cefepime -> 7/7 Vanc, mero--> 7/8 meropenem--> 7/9 Ertapenem        Antimicrobial allergy/intolerance:   Penicillin (see allergy list for nuances)  Valacyclovir - nausea      RECOMMENDATIONS    Diagnosis  ***  Repeat CT A/P ~6 weeks from 02/05/23 to fu peri-transplant kidney fluid collection  No need for weekly CMV PCR since off ppx and moderate risk (D+/R+)    Management  ***  Continue IV ertapenem renally dosed equiv 1g q24h with plans for at least 6 weeks of therapy (started 01/27/23, so ending around 8/17 pending imaging and clinical status)  Will need powerline removal addressed when she completes abx  For recurrent UTI, vaginal estrogen last filled 11/14/22 (21 day supply)    Antimicrobial prophylaxis required for transplant immunosuppression   She is 6 months out from transplant, can stop TMP+SMX ppx per protocol  She has completed valganciclovir ppx, but I'd consider starting valacyclovir ppx given her history of shingles, recognizing the risks of polypharmacy for her    Intensive toxicity monitoring for prescription antimicrobials   {Monitoring Labs:88543::CBC w/diff at least once per week,clinical assessments for rashes or other skin changes}    Follow up ***          Recommendations were communicated via shared medical record.    Audley Hose, MD  [facility]    Subjective     External record(s):  Reviewed Dr. Caroline More notes. Cr stable, LFTs not drawn at last lab check. Tac levels a bit lower per transplant pharmacist, increased and will re-check labs this week. Reviewed Dr. Elvera Maria phone note, concern that patient not taking prandial insulin correctly, considering oral options for DM. Dr. Elvera Maria considering belatacept but transporation to infusions may be challenging .    Independent historian(s): {ICH ID historian:[id number]}.       Interval History:     Medications:    Current Outpatient Medications:     alendronate (FOSAMAX) 70 MG tablet, Take 1 tablet (70 mg total) by mouth every seven (7) days., Disp: , Rfl:     amlodipine (NORVASC) 5 MG tablet, Take 1 tablet (5 mg total) by mouth every twelve (12) hours., Disp: 90 tablet, Rfl: 3    atorvastatin (LIPITOR) 10 MG tablet, Take 1 tablet (10 mg total) by mouth daily., Disp: 90 tablet, Rfl: 3    blood sugar diagnostic (ACCU-CHEK AVIVA PLUS TEST STRP) Strp, USE 1 STRIP TO CHECK GLUCOSE THREE TIMES DAILY, Disp: , Rfl:     blood sugar diagnostic (GLUCOSE BLOOD) Strp, Use to check blood sugar as directed with insulin 3 times a day & for symptoms of high or low blood sugar., Disp: 100 strip, Rfl: 11    blood sugar diagnostic Strp, USE 1 STRIP TO CHECK GLUCOSE THREE TIMES DAILY, Disp: ,  Rfl:     blood-glucose meter kit, Use as directed. ACCU-CHEK AVIVA, Disp: , Rfl:     blood-glucose meter kit, Use as instructed, Disp: 1 each, Rfl: 0    blood-glucose sensor (DEXCOM G7 SENSOR) Devi, Use 1 sensor every 10 days., Disp: 3 each, Rfl: 11    carvedilol (COREG) 25 MG tablet, Take 1 tablet (25 mg total) by mouth two (2) times a day., Disp: 180 tablet, Rfl: 3    chlorthalidone (HYGROTON) 25 MG tablet, Take 2 tablets (50 mg total) by mouth daily., Disp: 60 tablet, Rfl: 0    cholecalciferol, vitamin D3 25 mcg, 1,000 units,, (CHOLECALCIFEROL-25 MCG, 1,000 UNIT,) 1,000 unit (25 mcg) tablet, Take 2 tablets (50 mcg total) by mouth daily., Disp: , Rfl:     ertapenem 500 mg, OVERFILL 7 mL in sodium chloride 0.9 % 50 mL IVPB, Infuse 500 mg into a venous catheter daily., Disp: 1 each, Rfl: 0    estradiol (ESTRACE) 0.01 % (0.1 mg/gram) vaginal cream, Insert 2 g into the vagina nightly., Disp: 60 g, Rfl: 11    hydrALAZINE (APRESOLINE) 100 MG tablet, Take 1 tablet (100 mg total) by mouth Three (3) times a day., Disp: 90 tablet, Rfl: 0    insulin aspart (NOVOLOG FLEXPEN SUBQ), Inject 0-8 Units under the skin Three (3) times a day before meals. *Max daily dose: 33 units*, Disp: , Rfl:     insulin glargine (BASAGLAR, LANTUS) 100 unit/mL (3 mL) injection pen, Inject 0.04 mL (4 Units total) under the skin nightly., Disp: , Rfl:     lancets Misc, Use to check blood sugar as directed with insulin 3 times a day & for symptoms of high or low blood sugar., Disp: 100 each, Rfl: 0    levothyroxine (SYNTHROID) 112 MCG tablet, Take 1 tablet (112 mcg total) by mouth daily., Disp: 90 tablet, Rfl: 3    magnesium oxide-Mg AA chelate (MAGNESIUM PLUS PROTEIN) 133 mg, Take 1 tablet by mouth two (2) times a day., Disp: , Rfl:     mycophenolate (CELLCEPT) 250 mg capsule, Take 2 capsules (500 mg total) by mouth two (2) times a day., Disp: 360 capsule, Rfl: 3    pantoprazole (PROTONIX) 40 MG tablet, Take 1 tablet (40 mg total) by mouth daily., Disp: 90 tablet, Rfl: 3    pen needle, diabetic 32 gauge x 5/32 (4 mm) Ndle, Use with insulin up to 4 times/day as needed., Disp: 100 each, Rfl: 11    pen needle, diabetic 32 gauge x 5/32 Ndle, Use with insulin 2 times daily, Disp: , Rfl:     predniSONE (DELTASONE) 5 MG tablet, Take 1 tablet (5 mg total) by mouth in the morning., Disp: 90 tablet, Rfl: 3    sodium bicarbonate 650 mg tablet, Take 1 tablet (650 mg total) by mouth Three (3) times a day., Disp: 90 tablet, Rfl: 0    tacrolimus (PROGRAF) 0.5 MG capsule, Take 3 capsules (1.5 mg total) by mouth two (2) times a day., Disp: 540 capsule, Rfl: 3    Objective     Vital signs:  There were no vitals taken for this visit.    Physical Exam:  Const [x]  vital signs above    []  NAD, non-toxic appearance []  Chronically ill-appearing, non-distressed        Eyes []  Lids normal bilaterally, conjunctiva anicteric and noninjected OU     [] PERRL  [] EOMI        ENMT []  Normal appearance of external nose and ears,  no nasal discharge        []  MMM, no lesions on lips or gums []  No thrush, leukoplakia, oral lesions  []  Dentition good []  Edentulous []  Dental caries present  []  Hearing normal  []  TMs with good light reflexes bilaterally         Neck []  Neck of normal appearance and trachea midline        []  No thyromegaly, nodules, or tenderness   []  Full neck ROM        Lymph []  No LAD in neck     []  No LAD in supraclavicular area     []  No LAD in axillae   []  No LAD in epitrochlear chains     []  No LAD in inguinal areas        CV []  RRR            []  No peripheral edema     []  Pedal pulses intact   []  No abnormal heart sounds appreciated   []  Extremities WWP         Resp []  Normal WOB at rest    []  No breathlessness with speaking, no coughing  []  CTA anteriorly    []  CTA posteriorly          GI []  Normal inspection, NTND   []  NABS     []  No umbilical hernia on exam       []  No hepatosplenomegaly     []  Inspection of perineal and perianal areas normal        GU []  Normal external genitalia     [] No urinary catheter present in urethra   []  No CVA tenderness    []  No tenderness over renal allograft        MSK []  No clubbing or cyanosis of hands       []  No vertebral point tenderness  []  No focal tenderness or abnormalities on palpation of joints in RUE, LUE, RLE, or LLE        Skin []  No rashes, lesions, or ulcers of visualized skin     []  Skin warm and dry to palpation         Neuro []  Face expression symmetric  []  Sensation to light touch grossly intact throughout    []  Moves extremities equally    []  No tremor noted        []  CNs II-XII grossly intact     []  DTRs normal and symmetric throughout []  Gait unremarkable        Psych []  Appropriate affect       []  Fluent speech         []  Attentive, good eye contact  []  Oriented to person, place, time          []  Judgment and insight are appropriate           Data for Medical Decision Making     01/27/23 EKG QTcF 432    I discussed {ICH ID discussed:94018}.    I reviewed CBC results (WBC normal), chemistry results (Cr 1.35, stable), and micro result(s) (01/27/23 urine cx <1000 CFU).    I independently visualized/interpreted {ICH ID independently visualized:94017}.       Results in Past 30 Days  Result Component Current Result Ref Range Previous Result Ref Range   Absolute Eosinophils 0.1 (02/19/2023) 0.0 - 0.4 x10E3/uL 0.0 (02/12/2023) 0.0 - 0.4 x10E3/uL   Absolute Lymphocytes 1.0 (02/19/2023) 0.7 - 3.1 x10E3/uL 0.8 (02/12/2023) 0.7 -  3.1 x10E3/uL   Absolute Neutrophils 2.5 (02/19/2023) 1.4 - 7.0 x10E3/uL 4.0 (02/12/2023) 1.4 - 7.0 x10E3/uL   Alkaline Phosphatase 57 (02/03/2023) 46 - 116 U/L Not in Time Range    ALT <7 (L) (02/03/2023) 10 - 49 U/L Not in Time Range    AST 11 (02/03/2023) <=34 U/L Not in Time Range    BUN 31 (H) (02/19/2023) 8 - 27 mg/dL 41 (H) (1/61/0960) 8 - 27 mg/dL   Calcium 8.8 (4/54/0981) 8.7 - 10.3 mg/dL 8.8 (1/91/4782) 8.7 - 95.6 mg/dL   Creatinine 2.13 (H) (02/19/2023) 0.57 - 1.00 mg/dL 0.86 (H) (5/78/4696) 2.95 - 1.00 mg/dL   HGB 9.6 (L) (2/84/1324) 11.1 - 15.9 g/dL 8.7 (L) (10/23/270) 53.6 - 15.9 g/dL   Magnesium 1.9 (6/44/0347) 1.6 - 2.3 mg/dL 1.9 (11/15/9561) 1.6 - 2.3 mg/dL   Platelet 875 (L) (6/43/3295) 150 - 450 x10E3/uL 141 (L) (02/12/2023) 150 - 450 x10E3/uL   Potassium 4.7 (02/19/2023) 3.5 - 5.2 mmol/L 4.9 (02/12/2023) 3.5 - 5.2 mmol/L   Total Bilirubin 0.2 (L) (02/03/2023) 0.3 - 1.2 mg/dL Not in Time Range    WBC 4.0 (02/19/2023) 3.4 - 10.8 x10E3/uL 5.4 (02/12/2023) 3.4 - 10.8 x10E3/uL       Microbiology:  ***    Imaging:  ***

## 2023-03-01 LAB — CBC W/ DIFFERENTIAL
BANDED NEUTROPHILS ABSOLUTE COUNT: 0 10*3/uL (ref 0.0–0.1)
BASOPHILS ABSOLUTE COUNT: 0 10*3/uL (ref 0.0–0.2)
BASOPHILS RELATIVE PERCENT: 0 %
EOSINOPHILS ABSOLUTE COUNT: 0.1 10*3/uL (ref 0.0–0.4)
EOSINOPHILS RELATIVE PERCENT: 2 %
HEMATOCRIT: 31.2 % — ABNORMAL LOW (ref 34.0–46.6)
HEMOGLOBIN: 10.1 g/dL — ABNORMAL LOW (ref 11.1–15.9)
IMMATURE GRANULOCYTES: 1 %
LYMPHOCYTES ABSOLUTE COUNT: 1.3 10*3/uL (ref 0.7–3.1)
LYMPHOCYTES RELATIVE PERCENT: 31 %
MEAN CORPUSCULAR HEMOGLOBIN CONC: 32.4 g/dL (ref 31.5–35.7)
MEAN CORPUSCULAR HEMOGLOBIN: 31.3 pg (ref 26.6–33.0)
MEAN CORPUSCULAR VOLUME: 97 fL (ref 79–97)
MONOCYTES ABSOLUTE COUNT: 0.4 10*3/uL (ref 0.1–0.9)
MONOCYTES RELATIVE PERCENT: 9 %
NEUTROPHILS ABSOLUTE COUNT: 2.6 10*3/uL (ref 1.4–7.0)
NEUTROPHILS RELATIVE PERCENT: 57 %
PLATELET COUNT: 113 10*3/uL — ABNORMAL LOW (ref 150–450)
RED BLOOD CELL COUNT: 3.23 x10E6/uL — ABNORMAL LOW (ref 3.77–5.28)
RED CELL DISTRIBUTION WIDTH: 12.9 % (ref 11.7–15.4)
WHITE BLOOD CELL COUNT: 4.4 10*3/uL (ref 3.4–10.8)

## 2023-03-01 NOTE — Unmapped (Signed)
IMMUNOCOMPROMISED HOST INFECTIOUS DISEASE PROGRESS NOTE    Assessment/Plan:     [name] is a 72 y.o. 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 Had perinephric drain in place postoperatively, removed 08/30/22  - Immunosuppression: Tacrolimus (goal 5-7), MMF 500 BID (reduced 250mg  bid on 7/6) Pred 5  - Prophylaxis: valganciclovir x 3 months (mod risk, completed), TMP+SMX x 6 months (completed)  - Estimated Creatinine Clearance: 30.8 mL/min (A) (based on SCr of 1.29 mg/dL (H)).     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 Dubliensis 08/05/22, <10K, negative recipient samples, negative donor blood cx, treated with 2 weeks PO fluconazole  # Positive Hep B sAgb and core antibodies, c/w resolved infection, with moderate risk for reactivation 02/2021  - 12/28/22 HBV DNA not detected and HBsAg negative  # Recurrent MDR E.Coli Bacteremia/UTI  - 1/29 BCx/UCx E. Coli - s/p Levo x 3 weeks (thru stent removal, 3/15) (S: Cefazolin, Cipro/Levo, Pip-tazo, Cephalexin; R: TMP-SMX, Amp, Amp-sulb)  - 3/14 BCx/UCx ESBL/FQ-R E. Coli (S: Erta, Gent, Mero, Tobra, Cefepime SDD; I: ceftaz; R: Cipro/Levo, Cefazolin, Ceftriaxone, Amp) s/p 14 days Meropenem   - 4/13 Bcx/Ucx E. Coli (R CFZ, CTX, cipro/levo, I ceftaz, SDD CFP, susc nitro) s/p mero x 2 weeks  - 5/7 seen by urogyn, no structural issues planned to stop nitrofurantoin with switch to methenamine (not done), added vaginal estrogen/d-mannose.  - 6/3 UCx <10,000 E. Coli (S: Cefepime SDD; Erta/Mero, Gent, Nitrofurantoin, Tobra) s/p ertapenem until 01/15/23  - 4/29 started nitrofurantoin prophylaxis; not restarted after 6/24 due to reduced GFR and breakthrough in 12/2022     Active infection:  #Perinephric fluid collection 10/05/22, slowly improving 01/27/23  - 3/14 BCx/UCx ESBL/FQ-R E. Coli (S: Erta, Gent, Mero, Tobra, Cefepime SDD; I: ceftaz; R: Cipro/Levo, Cefazolin, Ceftriaxone, Amp) s/p 14 days Meropenem   - 3/19 IR guided drainage negative cx, Cr 1.5 not suggestive of urinoma  - 4/13 increased stranding, 4/16 not amenable to intervention per VIR  - 01/27/23 CT a/p 7.4 x 1.0 x 5.4 cm crescentic fluid collection within the soft tissues lateral to the transplant kidney (8.3 x 0.9 x 5.5 cm on 11/05/22)  - 7/10 VIR: collection not large enough to aspirate     # Fever and septic shock, presumed urinary source given pyuria and recurrent UTI history 01/27/23  - 7/6 3x emesis in the ED with sudden acute change in mental status, prompting intubation for airway protection --> extubated 7/7  - 7/6 flu/RSV/covid neg; UCx <1,000 CFU (appears on plates as E. Coli); BCx (  NG)  - 7/6 CXR more likely edema or aspiration then pneumonia  - 7/6 CT A/P: Bibasilar consolidation. No hydronephrosis. No solid renal mass. Atrophic bilateral native kidneys. Similar appearance of nonobstructive punctate calculi and vascular calcifications. Subcentimeter hypoattenuating lesions within the renal transplant, too small to characterize. No hydronephrosis. Similar inflammatory stranding surrounding the transplant kidney, particularly around the hilum. 7.4 x 1.0 x 5.4 cm crescentic fluid collection within the soft tissues lateral to the transplant kidney (8.3 x 0.9 x 5.5 cm on 11/05/22)  - 7/7 renal transplant [place]: The renal transplant was located in the left lower quadrant. Normal size and echogenicity. 2 subcentimeter anechoic cysts are present. There is pelvic caliectasis.   Rx 7/6 vanc/cefepime -> 7/7 Vanc, mero--> 7/8 meropenem--> 7/9 Ertapenem with plan for at least 8 weeks of therapy and duration depending on clinical and radiologic improvement     Antimicrobial allergy/intolerance:   Penicillin (see allergy list for nuances)  Valacyclovir - nausea        RECOMMENDATIONS  She looks quite well in clinic today, accompanied by her husband. Tolerating the ertapenem well, no diarrhea. While she likely has gotten a long enough course, the prior times we have stopped abx after 21 days for transplant pyelo she has relapsed. She has this persistent small fluid collection around the transplanted kidney that has been too small to sample. I'd like to get one more CT next month before stopping abx. If fluid still present, I don't think I'd extend further but I'd like to evaluate. Will be hard to assess for stranding given we won't get contrast, but I still think it'd be a good data point. While I don't think it would be wrong to stop abx right now, she's doing well on the therapy and I do think we should probably complete a few more weeks given her numerous relapses and lack of oral options (broke through oral nitrofurantoin before, fosfomycin was too $$$).     She should restart the vaginal estrogen for Uti prevention.     Diagnosis  Repeat CT A/P ~6 weeks from 01/27/23 to fu peri-transplant kidney fluid collection. I've gone ahead and scheduled this for 9/12 at 1:40 pm (she will see me in the morning)  No need for weekly CMV PCR since off ppx and moderate risk (D+/R+)    Management  Continue IV ertapenem renally dosed equiv 1g q24h with plans for at least 8 weeks of therapy (started 01/27/23, so will continue until 9/12 for ICID visit and imaging)  Will need powerline removal addressed when she completes abx, I've scheduled this for 9/16 at [facility] @ 2:20 pm since she has a neurology visit that morning at [facility] anyway  For recurrent UTI, vaginal estrogen last filled 11/14/22 (21 day supply). She hasn't been using this, I recommended she start that again when she's done with the abx  I'm wondering if fosfomycin is still cost-prohibitive, will check with renal tx team     Antimicrobial prophylaxis required for transplant immunosuppression   She is 6 months out from transplant, can stop TMP+SMX ppx per protocol  She has completed valganciclovir ppx, but I'd consider starting valacyclovir ppx given her history of shingles, recognizing the risks of polypharmacy for her    Intensive toxicity monitoring for prescription antimicrobials   CBC w/diff at least once per week  CMP at least once per week  clinical assessments for rashes or other skin changes    Follow up on 9/12 here at [facility]  Recommendations were communicated via shared medical record.    Audley Hose, MD  [place] Division of Infectious Diseases    Subjective     External record(s):  Reviewed Dr. Caroline More notes. Cr stable, LFTs not drawn at last lab check. Tac levels a bit lower per transplant pharmacist, increased and will re-check labs this week. Reviewed Dr. Elvera Maria phone note, concern that patient not taking prandial insulin correctly, considering oral options for DM. Dr. Elvera Maria considering belatacept but transporation to infusions may be challenging .    Independent historian(s): no independent historian required.       Interval History:     Medications:    Current Outpatient Medications:     alendronate (FOSAMAX) 70 MG tablet, Take 1 tablet (70 mg total) by mouth every seven (7) days., Disp: , Rfl:     amlodipine (NORVASC) 5 MG tablet, Take 1 tablet (5 mg total) by mouth every twelve (12) hours., Disp: 90 tablet, Rfl: 3    atorvastatin (LIPITOR) 10 MG tablet, Take 1 tablet (10 mg total) by mouth daily., Disp: 90 tablet, Rfl: 3    blood sugar diagnostic (ACCU-CHEK AVIVA PLUS TEST STRP) Strp, USE 1 STRIP TO CHECK GLUCOSE THREE TIMES DAILY, Disp: , Rfl:     blood sugar diagnostic (GLUCOSE BLOOD) Strp, Use to check blood sugar as directed with insulin 3 times a day & for symptoms of high or low blood sugar., Disp: 100 strip, Rfl: 11    blood sugar diagnostic Strp, USE 1 STRIP TO CHECK GLUCOSE THREE TIMES DAILY, Disp: , Rfl:     blood-glucose meter kit, Use as directed. ACCU-CHEK AVIVA, Disp: , Rfl:     blood-glucose meter kit, Use as instructed, Disp: 1 each, Rfl: 0    blood-glucose sensor (DEXCOM G7 SENSOR) Devi, Use 1 sensor every 10 days., Disp: 3 each, Rfl: 11    carvedilol (COREG) 25 MG tablet, Take 1 tablet (25 mg total) by mouth two (2) times a day., Disp: 180 tablet, Rfl: 3    chlorthalidone (HYGROTON) 25 MG tablet, Take 2 tablets (50 mg total) by mouth daily., Disp: 60 tablet, Rfl: 0    cholecalciferol, vitamin D3 25 mcg, 1,000 units,, (CHOLECALCIFEROL-25 MCG, 1,000 UNIT,) 1,000 unit (25 mcg) tablet, Take 2 tablets (50 mcg total) by mouth daily., Disp: , Rfl:     ertapenem 500 mg, OVERFILL 7 mL in sodium chloride 0.9 % 50 mL IVPB, Infuse 500 mg into a venous catheter daily., Disp: 1 each, Rfl: 0    estradiol (ESTRACE) 0.01 % (0.1 mg/gram) vaginal cream, Insert 2 g into the vagina nightly., Disp: 60 g, Rfl: 11    hydrALAZINE (APRESOLINE) 100 MG tablet, Take 1 tablet (100 mg total) by mouth Three (3) times a day., Disp: 90 tablet, Rfl: 0    insulin aspart (NOVOLOG FLEXPEN SUBQ), Inject 0-8 Units under the skin Three (3) times a day before meals. *Max daily dose: 33 units*, Disp: , Rfl:     insulin glargine (BASAGLAR, LANTUS) 100 unit/mL (3 mL) injection pen, Inject 0.04 mL (4 Units total) under the skin nightly., Disp: , Rfl:     lancets Misc, Use to check blood sugar as directed with insulin 3 times a day & for symptoms of high or low blood sugar., Disp: 100 each, Rfl: 0    levothyroxine (SYNTHROID) 112 MCG tablet, Take 1 tablet (112 mcg total) by mouth daily., Disp: 90 tablet, Rfl: 3    magnesium oxide-Mg AA chelate (MAGNESIUM PLUS PROTEIN) 133  mg, Take 1 tablet by mouth two (2) times a day., Disp: , Rfl:     mycophenolate (CELLCEPT) 250 mg capsule, Take 2 capsules (500 mg total) by mouth two (2) times a day., Disp: 360 capsule, Rfl: 3    pantoprazole (PROTONIX) 40 MG tablet, Take 1 tablet (40 mg total) by mouth daily., Disp: 90 tablet, Rfl: 3    pen needle, diabetic 32 gauge x 5/32 (4 mm) Ndle, Use with insulin up to 4 times/day as needed., Disp: 100 each, Rfl: 11    pen needle, diabetic 32 gauge x 5/32 Ndle, Use with insulin 2 times daily, Disp: , Rfl:     predniSONE (DELTASONE) 5 MG tablet, Take 1 tablet (5 mg total) by mouth in the morning., Disp: 90 tablet, Rfl: 3    sodium bicarbonate 650 mg tablet, Take 1 tablet (650 mg total) by mouth Three (3) times a day., Disp: 90 tablet, Rfl: 0    tacrolimus (PROGRAF) 0.5 MG capsule, Take 3 capsules (1.5 mg total) by mouth two (2) times a day., Disp: 540 capsule, Rfl: 3    Objective     Vital signs:  BP 154/48 (BP Site: R Arm, BP Position: Sitting, BP Cuff Size: Medium)  - Pulse 67  - Temp 37.1 ??C (98.7 ??F) (Oral)  - Ht 152.4 cm (5')  - Wt 57.2 kg (126 lb)  - BMI 24.61 kg/m??     Physical Exam:  Const [x]  vital signs above    []  NAD, non-toxic appearance []  Chronically ill-appearing, non-distressed        Eyes []  Lids normal bilaterally, conjunctiva anicteric and noninjected OU     [] PERRL  [] EOMI        ENMT []  Normal appearance of external nose and ears, no nasal discharge        []  MMM, no lesions on lips or gums [x]  No thrush, leukoplakia, oral lesions  []  Dentition good []  Edentulous []  Dental caries present  []  Hearing normal  []  TMs with good light reflexes bilaterally         Neck []  Neck of normal appearance and trachea midline        []  No thyromegaly, nodules, or tenderness   []  Full neck ROM        Lymph []  No LAD in neck     []  No LAD in supraclavicular area     []  No LAD in axillae   []  No LAD in epitrochlear chains     []  No LAD in inguinal areas        CV []  RRR            []  No peripheral edema     []  Pedal pulses intact   []  No abnormal heart sounds appreciated   []  Extremities WWP   Systolic murmur present      Resp [x]  Normal WOB at rest    []  No breathlessness with speaking, no coughing  []  CTA anteriorly    [x]  CTA posteriorly          GI []  Normal inspection, NTND   []  NABS     []  No umbilical hernia on exam       []  No hepatosplenomegaly     []  Inspection of perineal and perianal areas normal  No tenderness over transplanted kidney      GU []  Normal external genitalia     [] No urinary catheter present in urethra   []  No CVA tenderness    []   No tenderness over renal allograft        MSK []  No clubbing or cyanosis of hands       []  No vertebral point tenderness  []  No focal tenderness or abnormalities on palpation of joints in RUE, LUE, RLE, or LLE        Skin []  No rashes, lesions, or ulcers of visualized skin     []  Skin warm and dry to palpation   Tunneled line in chest C/D/I      Neuro [x]  Face expression symmetric  []  Sensation to light touch grossly intact throughout    []  Moves extremities equally    []  No tremor noted        []  CNs II-XII grossly intact     []  DTRs normal and symmetric throughout []  Gait unremarkable        Psych [x]  Appropriate affect       [x]  Fluent speech         []  Attentive, good eye contact  [x]  Oriented to person, place, time          []  Judgment and insight are appropriate           Data for Medical Decision Making     01/27/23 EKG QTcF 432    I discussed mgm't w/qualified health care professional(s) involved in case: recommendations communicated with nephrology team .    I reviewed CBC results (WBC normal), chemistry results (Cr 1.35, stable), and micro result(s) (01/27/23 urine cx <1000 CFU).    I independently visualized/interpreted CT images (CT from last month with very small but long fluid collection around the transplanted kidney, some stranding).       Results in Past 30 Days  Result Component Current Result Ref Range Previous Result Ref Range   Absolute Eosinophils 0.1 (03/05/2023) 0.0 - 0.4 x10E3/uL 0.1 (02/28/2023) 0.0 - 0.4 x10E3/uL   Absolute Lymphocytes 1.2 (03/05/2023) 0.7 - 3.1 x10E3/uL 1.3 (02/28/2023) 0.7 - 3.1 x10E3/uL   Absolute Neutrophils 4.0 (03/05/2023) 1.4 - 7.0 x10E3/uL 2.6 (02/28/2023) 1.4 - 7.0 x10E3/uL   BUN 46 (H) (03/05/2023) 8 - 27 mg/dL 34 (H) (07/29/1094) 8 - 27 mg/dL   Calcium 9.3 (0/45/4098) 8.7 - 10.3 mg/dL 8.9 (07/24/9145) 8.7 - 82.9 mg/dL   Creatinine 5.62 (H) (03/05/2023) 0.57 - 1.00 mg/dL 1.30 (H) (02/27/5783) 6.96 - 1.00 mg/dL   HGB 29.5 (L) (2/84/1324) 11.1 - 15.9 g/dL 40.1 (L) (0/08/7251) 66.4 - 15.9 g/dL   Magnesium 1.9 (10/24/4740) 1.6 - 2.3 mg/dL 1.6 (11/29/5636) 1.6 - 2.3 mg/dL   Platelet 756 (L) (4/33/2951) 150 - 450 x10E3/uL 113 (L) (02/28/2023) 150 - 450 x10E3/uL   Potassium 4.7 (03/05/2023) 3.5 - 5.2 mmol/L 4.7 (02/28/2023) 3.5 - 5.2 mmol/L   WBC 5.8 (03/05/2023) 3.4 - 10.8 x10E3/uL 4.4 (02/28/2023) 3.4 - 10.8 x10E3/uL

## 2023-03-01 NOTE — Unmapped (Signed)
Immunocompromised Host Infectious Diseases Complex Outpatient Antimicrobial Therapy (COpAT) Service    Julie [name]  is a 72 y.o. female followed by ICID COpAT program for transplant pyelonephritis.  Allergies: Gabapentin and Valacyclovir  Microbiology:   12/25/22 - Urine culture - Clean catch; Escherichia coli (S: cefepime, erta, gent, mero, nitro, tobra -- R: amp, aztreo, cefazolin, ceftriax, cephalexin, ciproflox, levoflox, tet, TMP/SMX -- I: ceftaz)  Antimicrobial regimen: ertapenem 500mg  q24h  Antimicrobial start date: 01/28/23  Antimicrobial end date (contingent upon): 08/17-09/01/24, to be confirmed  Line Access (date of placement): [facility], 02/06/23    Relevant results:    Lab Results   Component Value Date    WBC 4.4 02/28/2023    WBC 4.0 02/19/2023    WBC 5.4 02/12/2023    NEUTROABS 2.6 02/28/2023    NEUTROABS 2.5 02/19/2023    NEUTROABS 4.0 02/12/2023    EOSABS 0.1 02/28/2023    EOSABS 0.1 02/19/2023    EOSABS 0.0 02/12/2023    PLT 113 (L) 02/28/2023    PLT 117 (L) 02/19/2023    PLT 141 (L) 02/12/2023     Lab Results   Component Value Date    BUN 34 (H) 02/28/2023    BUN 31 (H) 02/19/2023    BUN 41 (H) 02/12/2023    CREATININE 1.39 (H) 02/28/2023    CREATININE 1.35 (H) 02/19/2023    CREATININE 1.29 (H) 02/12/2023     Lab Results   Component Value Date    ALBUMIN 4.0 02/19/2023    ALBUMIN 3.9 02/12/2023    ALBUMIN 3.0 (L) 02/03/2023     Lab Results   Component Value Date    ALKPHOS 57 02/03/2023    ALKPHOS 43 (L) 01/28/2023    ALKPHOS 57 01/27/2023    BILITOT 0.2 (L) 02/03/2023    BILITOT 0.4 01/28/2023    BILITOT 0.6 01/27/2023    ALT <7 (L) 02/03/2023    ALT 8 (L) 01/28/2023    ALT 11 01/27/2023    AST 11 02/03/2023    AST 11 01/28/2023    AST 12 01/27/2023     Assessment:  Scr stable at ~1.3 mg/dL. Est CrCl 26-30 ml/min. Dose appropriate.   WBC, ABS NEUTRO, ABS EOS WNL. Slightly low Plt, stable. She has known chronic idiopathic thrombocytopenia    Laboratory Monitoring:   CBC w/differential once weekly.  CMP once weekly.     Next scheduled labs: week of 03/05/23    Next ICID appointment: 03/08/23 with [name]    Action taken:  1. Continue ertapenem 500mg  q24h.  2. Time spent on documentation and coordination of care: 6 minutes    Tracey Harries, CPP  IC[facility] COpAT Program  Phone: [phone]  Fax:  [phone]

## 2023-03-02 LAB — TACROLIMUS LEVEL: TACROLIMUS BLOOD: 7.9 ng/mL (ref 2.0–20.0)

## 2023-03-03 DIAGNOSIS — R7881 Bacteremia: Secondary | ICD-10-CM | POA: Diagnosis not present

## 2023-03-03 DIAGNOSIS — A041 Enterotoxigenic Escherichia coli infection: Secondary | ICD-10-CM | POA: Diagnosis not present

## 2023-03-05 DIAGNOSIS — R7881 Bacteremia: Secondary | ICD-10-CM | POA: Diagnosis not present

## 2023-03-05 DIAGNOSIS — I2510 Atherosclerotic heart disease of native coronary artery without angina pectoris: Secondary | ICD-10-CM | POA: Diagnosis not present

## 2023-03-05 DIAGNOSIS — Z940 Kidney transplant status: Secondary | ICD-10-CM | POA: Diagnosis not present

## 2023-03-05 DIAGNOSIS — I10 Essential (primary) hypertension: Secondary | ICD-10-CM | POA: Diagnosis not present

## 2023-03-05 DIAGNOSIS — Z9889 Other specified postprocedural states: Secondary | ICD-10-CM | POA: Diagnosis not present

## 2023-03-05 DIAGNOSIS — Z09 Encounter for follow-up examination after completed treatment for conditions other than malignant neoplasm: Secondary | ICD-10-CM | POA: Diagnosis not present

## 2023-03-05 DIAGNOSIS — E782 Mixed hyperlipidemia: Secondary | ICD-10-CM | POA: Diagnosis not present

## 2023-03-05 DIAGNOSIS — E1159 Type 2 diabetes mellitus with other circulatory complications: Secondary | ICD-10-CM | POA: Diagnosis not present

## 2023-03-05 DIAGNOSIS — D649 Anemia, unspecified: Secondary | ICD-10-CM | POA: Diagnosis not present

## 2023-03-05 LAB — RENAL FUNCTION PANEL
ALBUMIN: 4.4 g/dL (ref 3.8–4.8)
BLOOD UREA NITROGEN: 46 mg/dL — ABNORMAL HIGH (ref 8–27)
BUN / CREAT RATIO: 36 — ABNORMAL HIGH (ref 12–28)
CALCIUM: 9.3 mg/dL (ref 8.7–10.3)
CHLORIDE: 109 mmol/L — ABNORMAL HIGH (ref 96–106)
CO2: 19 mmol/L — ABNORMAL LOW (ref 20–29)
CREATININE: 1.29 mg/dL — ABNORMAL HIGH (ref 0.57–1.00)
EGFR: 44 mL/min/{1.73_m2} — ABNORMAL LOW
GLUCOSE: 105 mg/dL — ABNORMAL HIGH (ref 70–99)
PHOSPHORUS, SERUM: 3.2 mg/dL (ref 3.0–4.3)
POTASSIUM: 4.7 mmol/L (ref 3.5–5.2)
SODIUM: 142 mmol/L (ref 134–144)

## 2023-03-05 LAB — MAGNESIUM: MAGNESIUM: 1.9 mg/dL (ref 1.6–2.3)

## 2023-03-06 LAB — CBC W/ DIFFERENTIAL
BANDED NEUTROPHILS ABSOLUTE COUNT: 0 10*3/uL (ref 0.0–0.1)
BASOPHILS ABSOLUTE COUNT: 0 10*3/uL (ref 0.0–0.2)
BASOPHILS RELATIVE PERCENT: 0 %
EOSINOPHILS ABSOLUTE COUNT: 0.1 10*3/uL (ref 0.0–0.4)
EOSINOPHILS RELATIVE PERCENT: 1 %
HEMATOCRIT: 32.6 % — ABNORMAL LOW (ref 34.0–46.6)
HEMOGLOBIN: 10.5 g/dL — ABNORMAL LOW (ref 11.1–15.9)
IMMATURE GRANULOCYTES: 1 %
LYMPHOCYTES ABSOLUTE COUNT: 1.2 10*3/uL (ref 0.7–3.1)
LYMPHOCYTES RELATIVE PERCENT: 21 %
MEAN CORPUSCULAR HEMOGLOBIN CONC: 32.2 g/dL (ref 31.5–35.7)
MEAN CORPUSCULAR HEMOGLOBIN: 31.1 pg (ref 26.6–33.0)
MEAN CORPUSCULAR VOLUME: 96 fL (ref 79–97)
MONOCYTES ABSOLUTE COUNT: 0.4 10*3/uL (ref 0.1–0.9)
MONOCYTES RELATIVE PERCENT: 8 %
NEUTROPHILS ABSOLUTE COUNT: 4 10*3/uL (ref 1.4–7.0)
NEUTROPHILS RELATIVE PERCENT: 69 %
PLATELET COUNT: 110 10*3/uL — ABNORMAL LOW (ref 150–450)
RED BLOOD CELL COUNT: 3.38 x10E6/uL — ABNORMAL LOW (ref 3.77–5.28)
RED CELL DISTRIBUTION WIDTH: 12.8 % (ref 11.7–15.4)
WHITE BLOOD CELL COUNT: 5.8 10*3/uL (ref 3.4–10.8)

## 2023-03-07 DIAGNOSIS — D631 Anemia in chronic kidney disease: Secondary | ICD-10-CM | POA: Diagnosis not present

## 2023-03-07 DIAGNOSIS — I110 Hypertensive heart disease with heart failure: Secondary | ICD-10-CM | POA: Diagnosis not present

## 2023-03-07 DIAGNOSIS — I509 Heart failure, unspecified: Secondary | ICD-10-CM | POA: Diagnosis not present

## 2023-03-07 DIAGNOSIS — G9341 Metabolic encephalopathy: Secondary | ICD-10-CM | POA: Diagnosis not present

## 2023-03-07 DIAGNOSIS — N12 Tubulo-interstitial nephritis, not specified as acute or chronic: Secondary | ICD-10-CM | POA: Diagnosis not present

## 2023-03-07 DIAGNOSIS — N189 Chronic kidney disease, unspecified: Secondary | ICD-10-CM | POA: Diagnosis not present

## 2023-03-07 DIAGNOSIS — E1122 Type 2 diabetes mellitus with diabetic chronic kidney disease: Secondary | ICD-10-CM | POA: Diagnosis not present

## 2023-03-07 DIAGNOSIS — T8613 Kidney transplant infection: Secondary | ICD-10-CM | POA: Diagnosis not present

## 2023-03-07 DIAGNOSIS — B9620 Unspecified Escherichia coli [E. coli] as the cause of diseases classified elsewhere: Secondary | ICD-10-CM | POA: Diagnosis not present

## 2023-03-07 LAB — TACROLIMUS LEVEL: TACROLIMUS BLOOD: 11.2 ng/mL (ref 2.0–20.0)

## 2023-03-08 ENCOUNTER — Ambulatory Visit: Admit: 2023-03-08 | Discharge: 2023-03-09 | Payer: MEDICARE

## 2023-03-08 DIAGNOSIS — T8619 Other complication of kidney transplant: Secondary | ICD-10-CM | POA: Diagnosis not present

## 2023-03-08 DIAGNOSIS — N151 Renal and perinephric abscess: Secondary | ICD-10-CM | POA: Diagnosis not present

## 2023-03-08 DIAGNOSIS — Z940 Kidney transplant status: Secondary | ICD-10-CM | POA: Diagnosis not present

## 2023-03-09 ENCOUNTER — Institutional Professional Consult (permissible substitution): Admit: 2023-03-09 | Discharge: 2023-03-09 | Payer: MEDICARE

## 2023-03-09 ENCOUNTER — Ambulatory Visit: Admit: 2023-03-09 | Discharge: 2023-03-09 | Payer: MEDICARE

## 2023-03-09 DIAGNOSIS — Z940 Kidney transplant status: Secondary | ICD-10-CM | POA: Diagnosis not present

## 2023-03-09 DIAGNOSIS — Z79899 Other long term (current) drug therapy: Secondary | ICD-10-CM | POA: Diagnosis not present

## 2023-03-09 DIAGNOSIS — R7881 Bacteremia: Secondary | ICD-10-CM | POA: Diagnosis not present

## 2023-03-09 DIAGNOSIS — B9620 Unspecified Escherichia coli [E. coli] as the cause of diseases classified elsewhere: Secondary | ICD-10-CM | POA: Diagnosis not present

## 2023-03-09 DIAGNOSIS — I158 Other secondary hypertension: Secondary | ICD-10-CM | POA: Diagnosis not present

## 2023-03-09 DIAGNOSIS — Z949 Transplanted organ and tissue status, unspecified: Secondary | ICD-10-CM | POA: Diagnosis not present

## 2023-03-09 LAB — URINALYSIS WITH MICROSCOPY
BILIRUBIN UA: NEGATIVE
BLOOD UA: NEGATIVE
GLUCOSE UA: NEGATIVE
KETONES UA: NEGATIVE
LEUKOCYTE ESTERASE UA: NEGATIVE
NITRITE UA: NEGATIVE
PH UA: 5.5 (ref 5.0–9.0)
PROTEIN UA: NEGATIVE
RBC UA: <1 /HPF (ref <=4)
SPECIFIC GRAVITY UA: 1.02 (ref 1.005–1.030)
SQUAMOUS EPITHELIAL: 4 /HPF (ref 0–5)
UROBILINOGEN UA: 0.2
WBC UA: 5 /HPF (ref 0–5)

## 2023-03-09 LAB — PROTEIN / CREATININE RATIO, URINE
CREATININE, URINE: 94 mg/dL
PROTEIN URINE: 9.2 mg/dL
PROTEIN/CREAT RATIO, URINE: 0.098

## 2023-03-09 MED ORDER — TACROLIMUS 0.5 MG CAPSULE, IMMEDIATE-RELEASE
ORAL_CAPSULE | ORAL | 3 refills | 90 days | Status: CP
Start: 2023-03-09 — End: 2023-03-09

## 2023-03-09 MED ORDER — SODIUM BICARBONATE 650 MG TABLET
ORAL_TABLET | Freq: Three times a day (TID) | ORAL | 3 refills | 90 days | Status: CP
Start: 2023-03-09 — End: ?

## 2023-03-09 MED ORDER — HYDRALAZINE 100 MG TABLET
ORAL_TABLET | Freq: Three times a day (TID) | ORAL | 3 refills | 90 days | Status: CP
Start: 2023-03-09 — End: ?

## 2023-03-09 MED ORDER — SPIRONOLACTONE 25 MG TABLET
ORAL_TABLET | Freq: Every day | ORAL | 3 refills | 90 days | Status: CP
Start: 2023-03-09 — End: 2024-03-08

## 2023-03-09 MED ORDER — CHLORTHALIDONE 25 MG TABLET
ORAL_TABLET | Freq: Every day | ORAL | 3 refills | 90 days | Status: CP
Start: 2023-03-09 — End: ?

## 2023-03-09 MED ORDER — MYCOPHENOLATE MOFETIL 250 MG CAPSULE
ORAL_CAPSULE | Freq: Two times a day (BID) | ORAL | 3 refills | 90 days | Status: CP
Start: 2023-03-09 — End: 2024-03-08

## 2023-03-09 MED ORDER — MAGNESIUM OXIDE 400 MG (241.3 MG MAGNESIUM) TABLET
ORAL_TABLET | Freq: Two times a day (BID) | ORAL | 11 refills | 30 days | Status: CP
Start: 2023-03-09 — End: 2024-03-08

## 2023-03-09 NOTE — Unmapped (Signed)
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   Native kidney disease biopsy-proven diabetic nephropathy   Early post-txp course notable for multiple readmissions for UTI, ileus/SBO due to volvulus, and for resistant hypertension  Graft function: creatinine 0.9-1.2 recently; now with AKI - IVF in clinic and reassess  DSAs: not present as of 09/06/22  Post-surgical issues:  - aspirin for 1 year post-op if tolerated (history pre-txp thrombocytopenia)  - ureteral stent removed 2/15    # Immunosuppression  Tacrolimus (Prograf) reduce to 1.5 mg AM, 1.0 mg PM, goal trough 5-7 ng/mL  Mycophenolate (Cellcept/Myfortic) reduce to 250 mg BID  Prednisone 5 mg daily  I recommend a switch to belatacept - would help with cognition, BP and vulnerability to prerenal AKIs. Ms. Mccommon and her husband agree to make this change. Once belatacept infusions are scheduled we will develop a tacrolimus taper plan.      # Acute issues today  None    # BP management   Goal 130/80, as tolerated. Currently signficiantly higher at home. Her tac is slightly over goal, likely contributing.   - amlodipine 5 mg BID  - carvedilol 25 mg BID  - chlorthalidone 50 mg daily  - hydralazine 100 mg BID  - spironolactone resume at 25 mg daily    # Infectious disease  CMV D+/R+, EBV D+/R+  CMV ppx:  Valcyte x3 mo  PJP ppx: Bactrim x6 mo  Frequent UTIs (ESBL E coli) driving 5 post-transplant admissions. Has perinephric fluid collection driving these recurrent severe infections.  Seeing Dr. Michiel Cowboy with ICID  - continue the ertapenem for another few weeks, will have a CT abdomen pelvis noncontrast on 9/12 and likely will stop the ertapenem then.  With same day ICID followup  - consideration of fosfomycin prophylaxis after coming off ertapenem - vaginal estrogen  - d-mannose and cranberry     # Anemia screening  Improving    # Cardiovascular: primary prevention  The ASCVD Risk score (Arnett DK, et al., 2019) failed to calculate.  - atorvastatain    # CKD-BMD  - s/p ergo  - start     # Electrolytes  Reviewed, acceptable    # DM2  Managed by endocrinology. On Lantus and Novolog.     # Immunizations  Immunization History   Administered Date(s) Administered    COVID-19 VAC,BIVALENT(37YR UP),PFIZER 09/04/2019, 09/25/2019    COVID-19 VAC,MRNA,TRIS(12Y UP)(PFIZER)(GRAY CAP) 09/04/2019, 09/25/2019, 04/29/2020, 11/27/2020    COVID-19 VACC,MRNA,(PFIZER)(PF) 09/04/2019, 09/25/2019, 04/29/2020    Covid-19 Vacc, Unspecified 09/04/2019, 09/04/2019, 09/25/2019, 09/25/2019    HEPATITIS B VACCINE ADULT,IM(ENERGIX B, RECOMBIVAX) 04/27/2017    Hepatitis B Vaccine, Unspecified Formulation 04/27/2017    INFLUENZA INJ MDCK PF, QUAD,(FLUCELVAX)(67MO AND UP EGG FREE) 06/29/2017    INFLUENZA QUAD HIGH DOSE 46YRS+(FLUZONE) 04/05/2016, 04/27/2017, 05/31/2018, 04/10/2019    Influenza Virus Vaccine, unspecified formulation 04/24/2015, 05/26/2015, 04/05/2016, 04/05/2016, 04/27/2017, 04/27/2017, 05/31/2018, 05/31/2018, 04/10/2019, 04/10/2019, 05/09/2019, 04/29/2020, 05/09/2021, 05/09/2021, 04/24/2022, 04/26/2022    PNEUMOCOCCAL POLYSACCHARIDE 23-VALENT 07/09/2015    PPD Test 12/19/2015, 10/24/2016, 11/26/2017, 03/08/2019, 01/26/2020, 12/22/2020, 01/04/2022    Pneumococcal Conjugate 13-Valent 07/31/2018, 08/02/2018    Pneumococcal vaccine, Unspecified Formulation 06/28/2016, 08/26/2021    Pneumococcal, Unspecified Formulation 08/26/2021    SHINGRIX-ZOSTER VACCINE (HZV),RECOMBINANT,ADJUVANTED(IM) 01/18/2023    TdaP  09/25/2016       # Cancer screening  PAP smear: does not do  Mammogram: negative 11/2021  Colonoscopy: may be due  Skin: recommend yearly dermatology evaluation    # Follow up:  Labs every 1-2 weeks  Visits 3 months      Kidney Transplant History:   Date of Transplant: 08/06/2022 (Kidney)  Type of Transplant: DCD  KDPI: 56%  Cold ischemic time: 877 minutes (14 hr 37 min)  Warm ischemic time: 32 minutes  cPRA: 0%  HLA match:   Blood type: Donor A1, Recipient A POS  ID: CMV D+/R+, EBV D+/R+  Native Kidney Disease: Diabetes              Native kidney biopsy: Diabetic nephropathy (done elsewhere)              Pre-transplant dialysis course: PD started July 2017, transitioned to HD October 2019 due to catheter dysfunction/inadequate dialysis  Prior Transplants: None  Induction: Basiliximab  Early steroid withdrawal: No    Biopsies:   None    History of Presenting Illness:     Since the last visit:  - since her discharge she feels ok. Receiving IV abx by home health, via a left chest tunelled catheter.    Post-transplant hospitalizations:    - 1/29-08/28/22: E coli complicated UTI  - 3/14-10/25/22 for E coli bacteremia of urinary source; c/b SBO due to small bowel volvulus s/p ex-lap for reduction of volvulus 10/13/22  - 4/13-4/23/24 for ESBL E coli bacteremia of urinary source.  - 6/3-12/30/22: fever presumed pyelonephritis without clear culture results  - 7/6-7/16/24: sepsis due to E coli bacteremia, with encephalopathy requiring intubation for airway protection      Concerns about nonadherence: Yes, patient sundowns, her daughters can't be in the home in the evening so they watch her take her meds via FaceTime call    Social: Lives in [place] with her husband (APS case of concerns for safety). Two caregivers are daughters, one lives nearby. Patient speaks [place] and [place].    Review of Systems:   A 12-system review was negative except as documented in the HPI.    Physical Exam:     BP 172/56 (BP Site: R Arm, BP Position: Sitting, BP Cuff Size: Medium)  - Pulse 67  - Temp 35.8 ??C (96.5 ??F) (Temporal)  - Wt 57.2 kg (126 lb)  - BMI 24.61 kg/m??   Constitutional:  Well-appearing in NAD  Eyes:  anicteric sclerae  ENT:  MMM  CV:  RRR, no m/r/g, no JVD, extremities WWP with no edema  Resp: Good air movement, CTAB  GI:  Abdomen soft, NTND, +bs  MSK:  Grossly normal, exam is limited  Skin:  Normal turgor, no rash  Neuro:  Grossly normal, exam is limited  Psych:  Normal affect      Allergies:   Allergies   Allergen Reactions    Gabapentin Other (See Comments)     Shakes    Valacyclovir Nausea Only and Other (See Comments)        Current Medications:   Current Outpatient Medications   Medication Sig Dispense Refill    alendronate (FOSAMAX) 70 MG tablet Take 1 tablet (70 mg total) by mouth every seven (7) days.      amlodipine (NORVASC) 5 MG tablet Take 1 tablet (5 mg total) by mouth every twelve (12) hours. 90 tablet 3    atorvastatin (LIPITOR) 10 MG tablet Take 1 tablet (10 mg total) by mouth  daily. 90 tablet 3    blood sugar diagnostic (ACCU-CHEK AVIVA PLUS TEST STRP) Strp USE 1 STRIP TO CHECK GLUCOSE THREE TIMES DAILY      blood sugar diagnostic (GLUCOSE BLOOD) Strp Use to check blood sugar as directed with insulin 3 times a day & for symptoms of high or low blood sugar. 100 strip 11    blood sugar diagnostic Strp USE 1 STRIP TO CHECK GLUCOSE THREE TIMES DAILY      blood-glucose meter kit Use as directed. ACCU-CHEK AVIVA      blood-glucose meter kit Use as instructed 1 each 0    blood-glucose sensor (DEXCOM G7 SENSOR) Devi Use 1 sensor every 10 days. 3 each 11    carvedilol (COREG) 25 MG tablet Take 1 tablet (25 mg total) by mouth two (2) times a day. 180 tablet 3    chlorthalidone (HYGROTON) 25 MG tablet Take 2 tablets (50 mg total) by mouth daily. 60 tablet 0    cholecalciferol, vitamin D3 25 mcg, 1,000 units,, (CHOLECALCIFEROL-25 MCG, 1,000 UNIT,) 1,000 unit (25 mcg) tablet Take 2 tablets (50 mcg total) by mouth daily.      ertapenem 500 mg, OVERFILL 7 mL in sodium chloride 0.9 % 50 mL IVPB Infuse 500 mg into a venous catheter daily. 1 each 0    estradiol (ESTRACE) 0.01 % (0.1 mg/gram) vaginal cream Insert 2 g into the vagina nightly. 60 g 11    hydrALAZINE (APRESOLINE) 100 MG tablet Take 1 tablet (100 mg total) by mouth Three (3) times a day. 90 tablet 0    insulin aspart (NOVOLOG FLEXPEN SUBQ) Inject 0-8 Units under the skin Three (3) times a day before meals. *Max daily dose: 33 units*      insulin glargine (BASAGLAR, LANTUS) 100 unit/mL (3 mL) injection pen Inject 0.04 mL (4 Units total) under the skin nightly.      lancets Misc Use to check blood sugar as directed with insulin 3 times a day & for symptoms of high or low blood sugar. 100 each 0    levothyroxine (SYNTHROID) 112 MCG tablet Take 1 tablet (112 mcg total) by mouth daily. 90 tablet 3    magnesium oxide-Mg AA chelate (MAGNESIUM PLUS PROTEIN) 133 mg Take 1 tablet by mouth two (2) times a day.      mycophenolate (CELLCEPT) 250 mg capsule Take 2 capsules (500 mg total) by mouth two (2) times a day. 360 capsule 3    pantoprazole (PROTONIX) 40 MG tablet Take 1 tablet (40 mg total) by mouth daily. 90 tablet 3    pen needle, diabetic 32 gauge x 5/32 (4 mm) Ndle Use with insulin up to 4 times/day as needed. 100 each 11    pen needle, diabetic 32 gauge x 5/32 Ndle Use with insulin 2 times daily      predniSONE (DELTASONE) 5 MG tablet Take 1 tablet (5 mg total) by mouth in the morning. 90 tablet 3    sodium bicarbonate 650 mg tablet Take 1 tablet (650 mg total) by mouth Three (3) times a day. 90 tablet 0    tacrolimus (PROGRAF) 0.5 MG capsule Take 3 capsules (1.5 mg total) by mouth two (2) times a day. 540 capsule 3     No current facility-administered medications for this visit.       Past Medical History:   Past Medical History:   Diagnosis Date    Anemia     Anemia due to stage 5 chronic kidney disease, not  on chronic dialysis (CMS-HCC) 08/08/2022    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)     since July 2017    Hypertension     Hypothyroidism (acquired)     Kidney transplant status, cadaveric 08/08/2022    Type 2 diabetes mellitus (CMS-HCC)         Laboratory studies:   Reviewed recent results.        Electronically signed by:   Leafy Half, MD  [facility]

## 2023-03-09 NOTE — Unmapped (Signed)
[facility] CLINIC PHARMACY NOTE  Julie [name]  [id number]    Medication changes today:   1. Decrease MMF to 250 mg BID  2. Restart spironolactone 25 mg daily   3. Reduce pantoprazole to PRN    Education/Adherence tools provided today:  - Provided updated medication list  - Provided additional education on immunosuppression and transplant related medications including reviewing indications of medications, dosing and side effects  - Discussed adherence reminder tools such as cell phone alarms  - Refills: Dexcom sensor, valcyte    Follow up items:  1. Goal of understanding indications and dosing of immunosuppression medications  2.  Investigate transition to belatacpet    Next visit with pharmacy in 1-3 months  ____________________________________________________________________    Julie [name] is a 72 y.o. female s/p deceased kidney transplant on Aug 15, 2022 (Kidney) 2/2  DM2 .     Immunologic Risk: cPRA 0,, HLA MM 5/6,, and first transplant    Induction Agent : basiliximab    Donor Factors: KDPI 56 and DCD    Other PMH significant for Diabetes and Hypothyroidism    Post op course complicated by infection    Rejection History: NTD  Infection History:   -07/2022: E Coli bacteremia   -10/2022: E Coli bacteremia x2 admissions  -12/2022: E Coli bacteremia  ___________________________________________________________________    Last seen by pharmacy 6 months ago    Interval History:   3/14-10/25/22: Admission for E Coli bacteremia.  S/p ex lap and reduction and small bowel vovulus on 3/22.  She was treated with meropenem and underwent IR guided fluid collection drainage.  She was treated for thrush with fluconazole.  4/13-4/23/24: E Coli UTI and bacteremia.  Fosfomycin ppx discussed but was cost prohibitive.  6/3-12/30/22: Hospitalized with fever.  Treated empirically with meropenem for presumed transplant pyelonephritis as infectious workup was negative.   7/6-7/16/24: Hospitalized with MDR E Coli UTI and sepsis.  Intubated in the ED and extubated on hospital day 2.    Seen by pharmacy today for: medication management, blood glucose management and education, and pill box fill and adherence education    CC:  Patient  is frustrated with frequent hospitalizations      General: No issues  Neuro:  Memory loss.  CV: No issues  Resp: No issues  GI: No issues  GU: No issues  Derm: No issues  Psych: No issues.    Fluid Status:   Edema no, SOB no      There were no vitals filed for this visit.      ___________________________________________________________________    Allergies   Allergen Reactions    Gabapentin Other (See Comments)     Shakes    Valacyclovir Nausea Only and Other (See Comments)       Medications reviewed in EPIC medication station and updated today by the clinical pharmacist practitioner.    Current Outpatient Medications   Medication Instructions    alendronate (FOSAMAX) 70 mg, Oral, Every 7 days    amlodipine (NORVASC) 5 mg, Oral, Every 12 hours    atorvastatin (LIPITOR) 10 mg, Oral, Daily (standard)    blood sugar diagnostic (ACCU-CHEK AVIVA PLUS TEST STRP) Strp USE 1 STRIP TO CHECK GLUCOSE THREE TIMES DAILY    blood sugar diagnostic (GLUCOSE BLOOD) Strp Use to check blood sugar as directed with insulin 3 times a day & for symptoms of high or low blood sugar.    blood sugar diagnostic Strp USE 1 STRIP TO CHECK GLUCOSE  THREE TIMES DAILY    blood-glucose meter kit Use as directed. ACCU-CHEK AVIVA    blood-glucose meter kit Use as instructed    blood-glucose sensor (DEXCOM G7 SENSOR) Devi Use 1 sensor every 10 days.    carvedilol (COREG) 25 mg, Oral, 2 times a day (standard)    chlorthalidone (HYGROTON) 50 mg, Oral, Daily (standard)    cholecalciferol (vitamin D3 25 mcg (1,000 units)) (CHOLECALCIFEROL-25 MCG (1,000 UNIT)) 50 mcg, Oral, Daily (standard)    ertapenem 500 mg, OVERFILL 7 mL in sodium chloride 0.9 % 50 mL IVPB 500 mg, Intravenous, Every 24 hours    estradiol (ESTRACE) 2 g, Vaginal, Nightly hydrALAZINE (APRESOLINE) 100 mg, Oral, 3 times a day (standard)    insulin aspart (NOVOLOG FLEXPEN SUBQ) 0-8 Units, Subcutaneous, 3 times a day (AC), *Max daily dose: 33 units*    insulin glargine (BASAGLAR, LANTUS) 4 Units, Subcutaneous, Nightly    lancets Misc Use to check blood sugar as directed with insulin 3 times a day & for symptoms of high or low blood sugar.    levothyroxine (SYNTHROID) 112 mcg, Oral, Daily (standard)    magnesium oxide-Mg AA chelate (MAGNESIUM PLUS PROTEIN) 133 mg 1 tablet, Oral, 2 times a day (standard)    mycophenolate (CELLCEPT) 500 mg, Oral, 2 times a day (standard)    pantoprazole (PROTONIX) 40 mg, Oral, Daily (standard)    pen needle, diabetic 32 gauge x 5/32 (4 mm) Ndle Use with insulin up to 4 times/day as needed.    pen needle, diabetic 32 gauge x 5/32 Ndle Use with insulin 2 times daily    predniSONE (DELTASONE) 5 mg, Oral, Daily    sodium bicarbonate 650 mg, Oral, 3 times a day (standard)    tacrolimus (PROGRAF) 1.5 mg, Oral, 2 times a day       GRAFT FUNCTION: stable   Lab Results   Component Value Date    Creatinine 1.29 (H) 03/05/2023    Creatinine 1.39 (H) 02/28/2023    Creatinine 1.35 (H) 02/19/2023    Creatinine 1.29 (H) 02/12/2023     Baseline Scr:  1.2  Estimated Creatinine Clearance: 30.8 mL/min (A) (based on SCr of 1.29 mg/dL (H)).    Proteinuria/UPC: No/ none.  Lab Results   Component Value Date    Protein/Creatinine Ratio, Urine 0.098 03/09/2023    Protein/Creatinine Ratio, Urine 0.121 09/28/2022    Protein/Creatinine Ratio, Urine 0.321 09/06/2022    Protein/Creatinine Ratio, Urine 6.635 08/11/2022       DSA: ntd  Lab Results   Component Value Date    DSA Comment  01/29/2023      Comment:      The HLA antigens listed are donor antigens and the corresponding MFI value.  MFI values >= 1000 are considered positive.  A negative result does not exclude the presence of low level DSA.  Blank donor antigen and MFI fields indicate unavailable donor HLA   type or lack of appropriate single antigen bead to determine DSA.  As such, the presence or absence of DSA to the locus cannot be confirmed.     Donor specific antibody (DSA) testing is performed with a  solid phase multiplex single antigen bead array method.  All procedures and reagents have been validated and performance characteristics determined by the [facility].    Certain of these tests have not been cleared/approved by the U.S. Food and Drug Administration (FDA).  The FDA has determined that such approval/clearance is not necessary because this laboratory is certified  under the Clinical Laboratory Improvements   Amendments to perform high complexity testing.  This test is used for clinical purposes.  It should not be regarded as investigational or for research.  All HLA typings are performed using molecular methodologies.  HLA-A, B, C, DR, and DQ results are   serological equivalents of the molecular type.           Zero hour biopsy: n/a  Biopsies to date: NTD      CURRENT IMMUNOSUPPRESSION:    Tacrolimus (Prograf) 1.5 mg every morning + 1.5 mg every evening    Tacrolimus Goal: 6 - 8  Mycophenolate mofetil (Cellcept) 500 mg BID (lowered with infection)  Prednisone 5 mg daily    IMMUNOSUPPRESSION DRUG LEVELS:  Lab Results   Component Value Date    Tacrolimus, Trough 5.5 02/05/2023    Tacrolimus, Trough 5.7 02/02/2023    Tacrolimus, Trough 6.8 02/01/2023    Tacrolimus Lvl 11.2 03/05/2023    Tacrolimus Lvl 7.9 02/28/2023    Tacrolimus Lvl 5.6 02/19/2023     No level today.    Patient is tolerating immunosuppression well    WBC/ANC:  wnl  Lab Results   Component Value Date    WBC 5.8 03/05/2023     Plan: Will decrease MMF to 250 mg BID in light of frequent UTIs.  Pt may benefit from transition to belatacept from neurocognitive perspective so will investigate insurance coverage.  Continue to monitor.      OI Prophylaxis:   CMV Status: D+/ R+, moderate risk .   CMV prophylaxis: valganciclovir 450 mg every other day x 3 months per protocol completed  No results found for: CMVCP    PCP Prophylaxis: bactrim SS 1 tab MWF x 6 months completed    Thrush: completed in hospital. Patient as oral thrush.    Patient is  on renal dosing    Plan: prophylaxis completed. Continue to monitor.    E Coli bacteremia  Ertapenem 500 mg q24h (ICID managing)    CV Prophylaxis:   The ASCVD Risk score (Arnett DK, et al., 2019) failed to calculate.  No results found for: LIPID    Statin therapy: Indicated; currently on atorvastatin 10 mg daily  Plan:  No change . Continue to monitor       BP: Goal < 140/90. Clinic vitals reported above  Home BP ranges: 130-140 systolic at home but 172/56 here  Current meds include:   Amlodipine 5 mg BID  Carvedilol 25 mg BID  Chlorthalidone 50 mg daily  Hydralazine 100 mg TID  Plan: out of goal, restart spironolactone 25 mg daily. Continue to monitor    Anemia of CKD:  H/H:   Lab Results   Component Value Date    HGB 10.5 (L) 03/05/2023     Lab Results   Component Value Date    HCT 32.6 (L) 03/05/2023     Iron panel:  Lab Results   Component Value Date    IRON 114 12/29/2022    TIBC 222 (L) 12/29/2022    FERRITIN 1,613.9 (H) 12/29/2022     Lab Results   Component Value Date    Iron Saturation (%) 51 12/29/2022     Prior ESA use: Epo given 1/25    Plan: out of goal. Continue to monitor.     DM:   Lab Results   Component Value Date    A1C 5.7 (H) 01/27/2023   . Goal A1c < 7  History of Dm? Yes:  Established with endocrinologist/PCP for BG managment? Yes - endo locally    Currently on:   Novolog 3-5 units with meals   Lantus 5 units at bedtime   Home BS log:  FBG 160s  Pre-Prandial: 180 - 200    Diet:Snacks  Exercise:minimal  Hypoglycemia: no  Plan: Defer to Endo      Electrolytes: wnl  Lab Results   Component Value Date    Potassium 4.7 03/05/2023    Potassium, Bld 4.2 01/28/2023    Sodium 142 03/05/2023    Sodium Whole Blood 138 01/28/2023    Magnesium 1.9 03/05/2023    CO2 19 (L) 03/05/2023    Phosphorus 2.9 12/30/2022     Meds currently on: mg plus protein 133 mg BID, sodium bicarbonate 650 mg TID  Plan: Next refill at [facility] will be with mag ox 400 mg BID.  Continue to monitor     GI/BM: pt reports 1 BM  Meds currently AV:[id number] 40 mg dialy   Plan: Decrease pantoprazole to 40 mg daily PRN. Continue to monitor    Pain: pt reports no pain  Meds currently on: APAP PRN  Plan: No change.  Continue to monitor    Bone health:   Vitamin D Level: none available. Goal > 30.   Lab Results   Component Value Date    Vitamin D Total (25OH) 34.9 09/28/2022       Lab Results   Component Value Date    Calcium 9.3 03/05/2023    Calcium 8.9 02/28/2023     Last DEXA results:  none available  Current meds include: D3 2000 units daily, alendronate 70 mg weekly  Plan: Vitamin D level  needs to be drawn with next lab schedule,continue supplementation. Continue to monitor.     Hypothyroidism:  Meds currently on: levothyroxine 112 mcg daily  Plan: Continue to monitor    Women's/Men's Health:  Joselinne Berliner is a 72 y.o. Female perimenopausal. Patient reports no men's/women's health issues  Plan: Continue to monitor    UTI ppx  Meds currently on: D mannose 2g daily, cranberry extract  Plan: restart estradiol cream per ICID    Immunizations:  Influenza [Annual]: Received 04/2022    PCV13: Received 2020  PPSV23: Received 2016. 2023  PCV20:     Shingrix Zoster [2 doses, 2 - 6 months apart]: Never received    COVID-19 [3 primary doses, 2 boosters]: Received      Pharmacy preference:  [facility]    Medication Refills:    Medication Access:      Adherence: Patient has poor understanding of medications; was not able to independently identify names/doses of immunosuppressants and OI meds.  Patient  does not fill their own pill box on a regular basis at home. Daughters fill.  Patient brought medication card:yes  Pill box:was correct  Plan: Daughters will help with AM and will Facetime for PM meds; provided extensive adherence counseling/intervention    Patient was reviewed with  Dr. Elvera Maria  who was agreement with the stated plan:     During this visit, the following was completed:   BG log data assessment  BP log data assessment  Labs ordered and evaluated  complex treatment plan >1 DS   I spent a total of 30 minutes face to face with the patient delivering clinical care and providing education/counseling.    All questions/concerns were addressed to the patient's satisfaction.  __________________________________________  Jordan Likes, CPP, PharmD  Solid Organ Transplant Clinical Pharmacist  Practitioner

## 2023-03-09 NOTE — Unmapped (Signed)
Immunocompromised Host Infectious Diseases Complex Outpatient Antimicrobial Therapy (COpAT) Service    Julie [name]  is a 72 y.o. female followed by ICID COpAT program for transplant pyelonephritis.  Allergies: Gabapentin and Valacyclovir  Microbiology:   12/25/22 - Urine culture - Clean catch; Escherichia coli (S: cefepime, erta, gent, mero, nitro, tobra -- R: amp, aztreo, cefazolin, ceftriax, cephalexin, ciproflox, levoflox, tet, TMP/SMX -- I: ceftaz)  Antimicrobial regimen: ertapenem 500mg  q24h  Antimicrobial start date: 01/28/23  Antimicrobial end date (contingent upon): 04/07/23, to be confirmed  Line Access (date of placement): [facility], 02/06/23    Relevant results:    Lab Results   Component Value Date    WBC 5.8 03/05/2023    WBC 4.4 02/28/2023    WBC 4.0 02/19/2023    NEUTROABS 4.0 03/05/2023    NEUTROABS 2.6 02/28/2023    NEUTROABS 2.5 02/19/2023    EOSABS 0.1 03/05/2023    EOSABS 0.1 02/28/2023    EOSABS 0.1 02/19/2023    PLT 110 (L) 03/05/2023    PLT 113 (L) 02/28/2023    PLT 117 (L) 02/19/2023     Lab Results   Component Value Date    BUN 46 (H) 03/05/2023    BUN 34 (H) 02/28/2023    BUN 31 (H) 02/19/2023    CREATININE 1.29 (H) 03/05/2023    CREATININE 1.39 (H) 02/28/2023    CREATININE 1.35 (H) 02/19/2023     Lab Results   Component Value Date    ALBUMIN 4.2 03/05/2023    ALBUMIN 4.0 02/19/2023    ALBUMIN 3.9 0722/2024     Lab Results   Component Value Date    ALKPHOS 62 03/05/2023    ALKPHOS 57 02/03/2023    ALKPHOS 43 (L) 01/28/2023    BILITOT 0.3 03/05/2023    BILITOT 0.2 (L) 02/03/2023    BILITOT 0.4 01/28/2023    ALT 8 03/05/2023    ALT <7 (L) 02/03/2023    ALT 8 (L) 01/28/2023    AST 10 03/05/2023    AST 11 02/03/2023    AST 11 01/28/2023     Assessment:  Scr stable at ~1.3 mg/dL. Est CrCl 26-30 ml/min. Dose appropriate.   WBC, ABS NEUTRO, ABS EOS WNL. Slightly low Plt, stable. She has known chronic idiopathic thrombocytopenia.  Remainder of monitoring labs WNL/stable.   Discussed with Dr. Michiel Cowboy. Extend ertapenem until repeat ICID appointment and CT a/p on 09/12. Orders called in to home infusion pharmacy (end date 04/07/23). Line removal scheduled for 09/16.    Laboratory Monitoring:   CBC w/differential once weekly.  CMP once weekly.     Next scheduled labs: week of 03/12/23    Next ICID appointment: 04/05/23 with [name]    Action taken:  1. Continue ertapenem 500mg  q24h.  2. Time spent on documentation and coordination of care: 10 minutes    Tracey Harries, CPP  [facility] COpAT Program  Phone: [phone]  Fax:  [phone]

## 2023-03-09 NOTE — Unmapped (Signed)
Transplant Coordinator, Clinic Visit   Pt seen today by transplant nephrology for follow up, reviewed medications and symptoms.   Met with pt and husband during clinic visit today.   Per Dr. Elvera Maria:  Pt will start belatacept- message sent to infusion clinic    Assessment  BP: 172/56 today in clinic. Husband reports home BP's are around 130-140's systolic  Lightheaded: denies  BG: pt sees endocrine for BS. Pt reports that her BS has been high during the day  Headache: denies  Hand tremors: denies  Numbness/tingling: denies  Fevers: denies  Chills/sweats: denies  Shortness of breath: denies  Chest pain or pressure: denies  Palpitations: denies  Abdominal pain: denies  Heart burn: denies  Nausea/vomiting: denies  Diarrhea/constipation: denies  UTI symptoms: denies  Swelling: denies    Good appetite; reports adequate hydration.     Intake: 40 oz fluids per day  Immunosuppressant last taken: 0730 today     Functional Score: 100   Normal no complaints; no evidence of  disease.     I spent a total of 20 minutes with Nedra Hai reviewing medications and symptoms.

## 2023-03-09 NOTE — Unmapped (Signed)
It was wonderful to see you today.    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  Dr. Michiel Cowboy from infectious disease plans to do a repeat CT scan on September 12 and she will see you that day to discuss the results.  You will need to continue the IV antibiotic (ertapenem) until then.     Other opportunities to reduce recurrent UTIs include:  -Use the vaginal estrogen cream  -Take cranberry and d-mannose supplements    In the fall we anticipate that there will be an updated influenza and COVID vaccine.  I recommend that you receive both.

## 2023-03-10 DIAGNOSIS — R7881 Bacteremia: Secondary | ICD-10-CM | POA: Diagnosis not present

## 2023-03-10 DIAGNOSIS — A041 Enterotoxigenic Escherichia coli infection: Secondary | ICD-10-CM | POA: Diagnosis not present

## 2023-03-12 DIAGNOSIS — R7881 Bacteremia: Secondary | ICD-10-CM | POA: Diagnosis not present

## 2023-03-12 DIAGNOSIS — E1122 Type 2 diabetes mellitus with diabetic chronic kidney disease: Secondary | ICD-10-CM | POA: Diagnosis not present

## 2023-03-12 DIAGNOSIS — E039 Hypothyroidism, unspecified: Secondary | ICD-10-CM | POA: Diagnosis not present

## 2023-03-12 DIAGNOSIS — Z0000 Encounter for general adult medical examination without abnormal findings: Secondary | ICD-10-CM | POA: Diagnosis not present

## 2023-03-12 DIAGNOSIS — Z794 Long term (current) use of insulin: Secondary | ICD-10-CM | POA: Diagnosis not present

## 2023-03-12 DIAGNOSIS — E1142 Type 2 diabetes mellitus with diabetic polyneuropathy: Secondary | ICD-10-CM | POA: Diagnosis not present

## 2023-03-12 DIAGNOSIS — Z955 Presence of coronary angioplasty implant and graft: Secondary | ICD-10-CM | POA: Diagnosis not present

## 2023-03-12 DIAGNOSIS — N12 Tubulo-interstitial nephritis, not specified as acute or chronic: Secondary | ICD-10-CM | POA: Diagnosis not present

## 2023-03-12 DIAGNOSIS — Z940 Kidney transplant status: Secondary | ICD-10-CM | POA: Diagnosis not present

## 2023-03-12 DIAGNOSIS — A041 Enterotoxigenic Escherichia coli infection: Secondary | ICD-10-CM | POA: Diagnosis not present

## 2023-03-12 DIAGNOSIS — N189 Chronic kidney disease, unspecified: Secondary | ICD-10-CM | POA: Diagnosis not present

## 2023-03-12 DIAGNOSIS — G9341 Metabolic encephalopathy: Secondary | ICD-10-CM | POA: Diagnosis not present

## 2023-03-12 DIAGNOSIS — B9620 Unspecified Escherichia coli [E. coli] as the cause of diseases classified elsewhere: Secondary | ICD-10-CM | POA: Diagnosis not present

## 2023-03-12 DIAGNOSIS — T8613 Kidney transplant infection: Secondary | ICD-10-CM | POA: Diagnosis not present

## 2023-03-12 DIAGNOSIS — I110 Hypertensive heart disease with heart failure: Secondary | ICD-10-CM | POA: Diagnosis not present

## 2023-03-12 DIAGNOSIS — Z1331 Encounter for screening for depression: Secondary | ICD-10-CM | POA: Diagnosis not present

## 2023-03-12 DIAGNOSIS — D631 Anemia in chronic kidney disease: Secondary | ICD-10-CM | POA: Diagnosis not present

## 2023-03-12 LAB — RENAL FUNCTION PANEL
ALBUMIN: 4.4 g/dL (ref 3.8–4.8)
BLOOD UREA NITROGEN: 38 mg/dL — ABNORMAL HIGH (ref 8–27)
BUN / CREAT RATIO: 28 (ref 12–28)
CALCIUM: 9.3 mg/dL (ref 8.7–10.3)
CHLORIDE: 111 mmol/L — ABNORMAL HIGH (ref 96–106)
CO2: 21 mmol/L (ref 20–29)
CREATININE: 1.38 mg/dL — ABNORMAL HIGH (ref 0.57–1.00)
EGFR: 40 mL/min/{1.73_m2} — ABNORMAL LOW
GLUCOSE: 123 mg/dL — ABNORMAL HIGH (ref 70–99)
PHOSPHORUS, SERUM: 3.3 mg/dL (ref 3.0–4.3)
POTASSIUM: 5.3 mmol/L — ABNORMAL HIGH (ref 3.5–5.2)
SODIUM: 138 mmol/L (ref 134–144)

## 2023-03-12 LAB — MAGNESIUM: MAGNESIUM: 1.8 mg/dL (ref 1.6–2.3)

## 2023-03-13 DIAGNOSIS — Z940 Kidney transplant status: Secondary | ICD-10-CM | POA: Diagnosis not present

## 2023-03-13 LAB — CBC W/ DIFFERENTIAL
BANDED NEUTROPHILS ABSOLUTE COUNT: 0 10*3/uL (ref 0.0–0.1)
BASOPHILS ABSOLUTE COUNT: 0 10*3/uL (ref 0.0–0.2)
BASOPHILS RELATIVE PERCENT: 0 %
EOSINOPHILS ABSOLUTE COUNT: 0.1 10*3/uL (ref 0.0–0.4)
EOSINOPHILS RELATIVE PERCENT: 1 %
HEMATOCRIT: 32.7 % — ABNORMAL LOW (ref 34.0–46.6)
HEMOGLOBIN: 10.7 g/dL — ABNORMAL LOW (ref 11.1–15.9)
IMMATURE GRANULOCYTES: 1 %
LYMPHOCYTES ABSOLUTE COUNT: 0.9 10*3/uL (ref 0.7–3.1)
LYMPHOCYTES RELATIVE PERCENT: 15 %
MEAN CORPUSCULAR HEMOGLOBIN CONC: 32.7 g/dL (ref 31.5–35.7)
MEAN CORPUSCULAR HEMOGLOBIN: 30.7 pg (ref 26.6–33.0)
MEAN CORPUSCULAR VOLUME: 94 fL (ref 79–97)
MONOCYTES ABSOLUTE COUNT: 0.4 10*3/uL (ref 0.1–0.9)
MONOCYTES RELATIVE PERCENT: 6 %
NEUTROPHILS ABSOLUTE COUNT: 4.5 10*3/uL (ref 1.4–7.0)
NEUTROPHILS RELATIVE PERCENT: 77 %
PLATELET COUNT: 122 10*3/uL — ABNORMAL LOW (ref 150–450)
RED BLOOD CELL COUNT: 3.48 x10E6/uL — ABNORMAL LOW (ref 3.77–5.28)
RED CELL DISTRIBUTION WIDTH: 12.8 % (ref 11.7–15.4)
WHITE BLOOD CELL COUNT: 5.8 10*3/uL (ref 3.4–10.8)

## 2023-03-13 LAB — TACROLIMUS LEVEL: TACROLIMUS BLOOD: 7.6 ng/mL (ref 2.0–20.0)

## 2023-03-13 NOTE — Unmapped (Signed)
Immunocompromised Host Infectious Diseases Complex Outpatient Antimicrobial Therapy (COpAT) Service    Julie [name]  is a 72 y.o. female followed by IC[facility] COpAT program for transplant pyelonephritis.  Allergies: Gabapentin and Valacyclovir  Microbiology:   12/25/22 - Urine culture - Clean catch; Escherichia coli (S: cefepime, erta, gent, mero, nitro, tobra -- R: amp, aztreo, cefazolin, ceftriax, cephalexin, ciproflox, levoflox, tet, TMP/SMX -- I: ceftaz)  Antimicrobial regimen: ertapenem 500mg  q24h  Antimicrobial start date: 01/28/23  Antimicrobial end date (contingent upon): 04/07/23, to be confirmed  Line Access (date of placement): [facility], 02/06/23    Relevant results:    Lab Results   Component Value Date    WBC 5.8 03/12/2023    WBC 5.8 03/05/2023    WBC 4.4 02/28/2023    NEUTROABS 4.5 03/12/2023    NEUTROABS 4.0 03/05/2023    NEUTROABS 2.6 02/28/2023    EOSABS 0.1 03/12/2023    EOSABS 0.1 03/05/2023    EOSABS 0.1 02/28/2023    PLT 122 (L) 03/12/2023    PLT 110 (L) 03/05/2023    PLT 113 (L) 02/28/2023     Lab Results   Component Value Date    BUN 38 (H) 03/12/2023    BUN 46 (H) 03/05/2023    BUN 34 (H) 02/28/2023    CREATININE 1.38 (H) 03/12/2023    CREATININE 1.29 (H) 03/05/2023    CREATININE 1.39 (H) 02/28/2023     Lab Results   Component Value Date    ALBUMIN 4.4 03/12/2023    ALBUMIN 4.2 03/05/2023    ALBUMIN 4.0 02/19/2023     Lab Results   Component Value Date    ALKPHOS 62 03/05/2023    ALKPHOS 57 02/03/2023    ALKPHOS 43 (L) 01/28/2023    BILITOT 0.3 03/05/2023    BILITOT 0.2 (L) 02/03/2023    BILITOT 0.4 01/28/2023    ALT 8 03/05/2023    ALT <7 (L) 02/03/2023    ALT 8 (L) 01/28/2023    AST 10 03/05/2023    AST 11 02/03/2023    AST 11 01/28/2023     Assessment:  Scr stable at ~1.3-1.4 mg/dL. Est CrCl 26-30 ml/min. Dose appropriate.   WBC, ABS NEUTRO, ABS EOS WNL. Slightly low Plt, stable. She has known chronic idiopathic thrombocytopenia.  Remainder of monitoring labs WNL/stable. Laboratory Monitoring:   CBC w/differential once weekly.  CMP once weekly.     Next scheduled labs: week of 03/19/23    Next ICID appointment: 04/05/23 with [name]    Action taken:  1. Continue ertapenem 500mg  q24h.  2. Time spent on documentation and coordination of care: 4 minutes    Tracey Harries, CPP  [facility] COpAT Program  Phone: [phone]  Fax:  [phone]

## 2023-03-14 DIAGNOSIS — N12 Tubulo-interstitial nephritis, not specified as acute or chronic: Secondary | ICD-10-CM | POA: Diagnosis not present

## 2023-03-14 DIAGNOSIS — E1122 Type 2 diabetes mellitus with diabetic chronic kidney disease: Secondary | ICD-10-CM | POA: Diagnosis not present

## 2023-03-14 DIAGNOSIS — B9620 Unspecified Escherichia coli [E. coli] as the cause of diseases classified elsewhere: Secondary | ICD-10-CM | POA: Diagnosis not present

## 2023-03-14 DIAGNOSIS — T8613 Kidney transplant infection: Secondary | ICD-10-CM | POA: Diagnosis not present

## 2023-03-14 DIAGNOSIS — A041 Enterotoxigenic Escherichia coli infection: Secondary | ICD-10-CM | POA: Diagnosis not present

## 2023-03-14 DIAGNOSIS — N189 Chronic kidney disease, unspecified: Secondary | ICD-10-CM | POA: Diagnosis not present

## 2023-03-14 DIAGNOSIS — D631 Anemia in chronic kidney disease: Secondary | ICD-10-CM | POA: Diagnosis not present

## 2023-03-14 DIAGNOSIS — R7881 Bacteremia: Secondary | ICD-10-CM | POA: Diagnosis not present

## 2023-03-14 DIAGNOSIS — I110 Hypertensive heart disease with heart failure: Secondary | ICD-10-CM | POA: Diagnosis not present

## 2023-03-14 DIAGNOSIS — G9341 Metabolic encephalopathy: Secondary | ICD-10-CM | POA: Diagnosis not present

## 2023-03-14 DIAGNOSIS — I509 Heart failure, unspecified: Secondary | ICD-10-CM | POA: Diagnosis not present

## 2023-03-16 NOTE — Unmapped (Signed)
Belatacept Conversion Plan    Day Date Belatacept Dose Tacrolimus Dose   1 03/29/23 5 mg/kg Continue   1.5 mg AM and 1 mg PM   15 04/12/23 5 mg/kg  Decrease to  1 mg BID   23 04/19/23 --- Decrease to  0.5 mg BID   29 04/26/23 5 mg/kg Stop     Olivia Mackie, PharmD, CPP  Solid Organ Transplant Clinical Pharmacist Practitioner

## 2023-03-19 ENCOUNTER — Institutional Professional Consult (permissible substitution): Admit: 2023-03-19 | Discharge: 2023-03-20 | Payer: MEDICARE

## 2023-03-19 NOTE — Unmapped (Unsigned)
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 CSW follows up to assess general check-in.    PREFERRED LANGUAGE: English/Korean    INTERPRETER UTILIZED: N/A    TRANSPLANT DATE:   08/06/2022 (Kidney)    POST TXP RN COORDINATOR:   Celene Squibb [phone]; [phone]    SUMMARY:  Called pt at appt time today for general check in.  Spouse/Julie [name] available via speaker phone. Pt is hopeful that she will be able to stop iv abx at her next appt on 04/05/23 w/ ID.  She feels that the abx seem to be working and has a scan coming up to evaluate the size of the original fluid collection.      No other specific questions at this time.      Will f/up for general monitoring q 4 weeks.      Julie Petties, LCSW, CCTSW  Transplant Case Manager  [facility]  04/02/2023 further issues due to hx of confusion, low health literacy, etc.  Julie [name]/HH agreed to call back to spouse on his direct line and to reopen record.      No further issues noted at this time.    F/up q 3-4 weeks for general monitoring.      Julie Petties, LCSW, CCTSW  Transplant Case Manager  [facility]  03/19/2023      FEELING OK... HAVE ANOTHER 21 DAYS OF ABX...    THINK EVERYTHING IS OK... SPOUSE IS OK...    F/up q 6 weeks    ___________________________  02/15/23  In car w/ spouse...     Doing well w/ HH and HI...    Labs going well...    HH and HI going smoother...     Out on Sunday?      [facility] delivery?  Julie [name] was going to call and check...     OK for now...   ______________________  03/19/23    Abx until 9/12?  Next ID appt..    Still on abx...      Some kind of scan to see if fluid is gone/cleared...  abx seem to be working...    [name].... doing OK    Can anyone help w/ my chart...need help downloading.Marland KitchenMarland KitchenMarland Kitchen

## 2023-03-20 DIAGNOSIS — Z940 Kidney transplant status: Secondary | ICD-10-CM | POA: Diagnosis not present

## 2023-03-20 LAB — CBC W/ DIFFERENTIAL
BANDED NEUTROPHILS ABSOLUTE COUNT: 0 10*3/uL (ref 0.0–0.1)
BASOPHILS ABSOLUTE COUNT: 0 10*3/uL (ref 0.0–0.2)
BASOPHILS RELATIVE PERCENT: 0 %
EOSINOPHILS ABSOLUTE COUNT: 0.1 10*3/uL (ref 0.0–0.4)
EOSINOPHILS RELATIVE PERCENT: 2 %
HEMATOCRIT: 32.6 % — ABNORMAL LOW (ref 34.0–46.6)
HEMOGLOBIN: 10.8 g/dL — ABNORMAL LOW (ref 11.1–15.9)
IMMATURE GRANULOCYTES: 0 %
LYMPHOCYTES ABSOLUTE COUNT: 1.3 10*3/uL (ref 0.7–3.1)
LYMPHOCYTES RELATIVE PERCENT: 22 %
MEAN CORPUSCULAR HEMOGLOBIN CONC: 33.1 g/dL (ref 31.5–35.7)
MEAN CORPUSCULAR HEMOGLOBIN: 32 pg (ref 26.6–33.0)
MEAN CORPUSCULAR VOLUME: 96 fL (ref 79–97)
MONOCYTES ABSOLUTE COUNT: 0.4 10*3/uL (ref 0.1–0.9)
MONOCYTES RELATIVE PERCENT: 7 %
NEUTROPHILS ABSOLUTE COUNT: 3.9 10*3/uL (ref 1.4–7.0)
NEUTROPHILS RELATIVE PERCENT: 69 %
PLATELET COUNT: 112 10*3/uL — ABNORMAL LOW (ref 150–450)
RED BLOOD CELL COUNT: 3.38 x10E6/uL — ABNORMAL LOW (ref 3.77–5.28)
RED CELL DISTRIBUTION WIDTH: 12.9 % (ref 11.7–15.4)
WHITE BLOOD CELL COUNT: 5.6 10*3/uL (ref 3.4–10.8)

## 2023-03-21 DIAGNOSIS — N189 Chronic kidney disease, unspecified: Secondary | ICD-10-CM | POA: Diagnosis not present

## 2023-03-21 DIAGNOSIS — A041 Enterotoxigenic Escherichia coli infection: Secondary | ICD-10-CM | POA: Diagnosis not present

## 2023-03-21 DIAGNOSIS — T8613 Kidney transplant infection: Secondary | ICD-10-CM | POA: Diagnosis not present

## 2023-03-21 DIAGNOSIS — R7881 Bacteremia: Secondary | ICD-10-CM | POA: Diagnosis not present

## 2023-03-21 DIAGNOSIS — I509 Heart failure, unspecified: Secondary | ICD-10-CM | POA: Diagnosis not present

## 2023-03-21 DIAGNOSIS — N12 Tubulo-interstitial nephritis, not specified as acute or chronic: Secondary | ICD-10-CM | POA: Diagnosis not present

## 2023-03-21 DIAGNOSIS — G9341 Metabolic encephalopathy: Secondary | ICD-10-CM | POA: Diagnosis not present

## 2023-03-21 DIAGNOSIS — I110 Hypertensive heart disease with heart failure: Secondary | ICD-10-CM | POA: Diagnosis not present

## 2023-03-21 DIAGNOSIS — D631 Anemia in chronic kidney disease: Secondary | ICD-10-CM | POA: Diagnosis not present

## 2023-03-21 DIAGNOSIS — E1122 Type 2 diabetes mellitus with diabetic chronic kidney disease: Secondary | ICD-10-CM | POA: Diagnosis not present

## 2023-03-21 DIAGNOSIS — B9620 Unspecified Escherichia coli [E. coli] as the cause of diseases classified elsewhere: Secondary | ICD-10-CM | POA: Diagnosis not present

## 2023-03-21 LAB — MAGNESIUM: MAGNESIUM: 1.8 mg/dL (ref 1.6–2.3)

## 2023-03-21 LAB — RENAL FUNCTION PANEL
ALBUMIN: 4.3 g/dL (ref 3.8–4.8)
BLOOD UREA NITROGEN: 35 mg/dL — ABNORMAL HIGH (ref 8–27)
BUN / CREAT RATIO: 23 (ref 12–28)
CALCIUM: 8.9 mg/dL (ref 8.7–10.3)
CHLORIDE: 110 mmol/L — ABNORMAL HIGH (ref 96–106)
CO2: 21 mmol/L (ref 20–29)
CREATININE: 1.5 mg/dL — ABNORMAL HIGH (ref 0.57–1.00)
EGFR: 37 mL/min/{1.73_m2} — ABNORMAL LOW
GLUCOSE: 139 mg/dL — ABNORMAL HIGH (ref 70–99)
PHOSPHORUS, SERUM: 3.8 mg/dL (ref 3.0–4.3)
POTASSIUM: 4.7 mmol/L (ref 3.5–5.2)
SODIUM: 143 mmol/L (ref 134–144)

## 2023-03-21 LAB — TACROLIMUS LEVEL: TACROLIMUS BLOOD: 4.7 ng/mL (ref 2.0–20.0)

## 2023-03-21 NOTE — Unmapped (Signed)
Immunocompromised Host Infectious Diseases Complex Outpatient Antimicrobial Therapy (COpAT) Service    Julie [name]  is a 72 y.o. female followed by IC[facility] COpAT program for transplant pyelonephritis.  Allergies: Gabapentin and Valacyclovir  Microbiology:   12/25/22 - Urine culture - Clean catch; Escherichia coli (S: cefepime, erta, gent, mero, nitro, tobra -- R: amp, aztreo, cefazolin, ceftriax, cephalexin, ciproflox, levoflox, tet, TMP/SMX -- I: ceftaz)  Antimicrobial regimen: ertapenem 500mg  q24h  Antimicrobial start date: 01/28/23  Antimicrobial end date (contingent upon): 04/07/23, to be confirmed  Line Access (date of placement): [facility], 02/06/23    Relevant results:    Lab Results   Component Value Date    WBC 5.6 03/20/2023    WBC 5.8 03/12/2023    WBC 5.8 03/05/2023    NEUTROABS 3.9 03/20/2023    NEUTROABS 4.5 03/12/2023    NEUTROABS 4.0 03/05/2023    EOSABS 0.1 03/20/2023    EOSABS 0.1 03/12/2023    EOSABS 0.1 03/05/2023    PLT 112 (L) 03/20/2023    PLT 122 (L) 03/12/2023    PLT 110 (L) 03/05/2023     Lab Results   Component Value Date    BUN 35 (H) 03/20/2023    BUN 38 (H) 03/12/2023    BUN 46 (H) 03/05/2023    CREATININE 1.50 (H) 03/20/2023    CREATININE 1.38 (H) 03/12/2023    CREATININE 1.29 (H) 03/05/2023     Lab Results   Component Value Date    ALBUMIN 4.4 03/12/2023    ALBUMIN 4.2 03/05/2023    ALBUMIN 4.0 02/19/2023     Lab Results   Component Value Date    ALKPHOS 62 03/05/2023    ALKPHOS 57 02/03/2023    ALKPHOS 43 (L) 01/28/2023    BILITOT 0.3 03/05/2023    BILITOT 0.2 (L) 02/03/2023    BILITOT 0.4 01/28/2023    ALT 8 03/05/2023    ALT <7 (L) 02/03/2023    ALT 8 (L) 01/28/2023    AST 10 03/05/2023    AST 11 02/03/2023    AST 11 01/28/2023     Assessment:  Scr stable at ~1.3-1.5 mg/dL. Est CrCl 26-30 ml/min. Dose appropriate.   WBC, ABS NEUTRO, ABS EOS WNL. Slightly low Plt, stable. She has known chronic idiopathic thrombocytopenia.  Remainder of monitoring labs WNL/stable. Laboratory Monitoring:   CBC w/differential once weekly.  CMP once weekly.     Next scheduled labs: week of 03/26/23    Next ICID appointment: 04/05/23 with [name]    Action taken:  1. Continue ertapenem 500mg  q24h.  2. Time spent on documentation and coordination of care: 4 minutes    Tracey Harries, CPP  [facility] COpAT Program  Phone: [phone]  Fax:  [phone]

## 2023-03-25 DIAGNOSIS — Z940 Kidney transplant status: Principal | ICD-10-CM

## 2023-03-27 DIAGNOSIS — Z940 Kidney transplant status: Secondary | ICD-10-CM | POA: Diagnosis not present

## 2023-03-27 LAB — RENAL FUNCTION PANEL
ALBUMIN: 4.2 g/dL (ref 3.8–4.8)
BLOOD UREA NITROGEN: 40 mg/dL — ABNORMAL HIGH (ref 8–27)
BUN / CREAT RATIO: 32 — ABNORMAL HIGH (ref 12–28)
CALCIUM: 9.2 mg/dL (ref 8.7–10.3)
CHLORIDE: 112 mmol/L — ABNORMAL HIGH (ref 96–106)
CO2: 22 mmol/L (ref 20–29)
CREATININE: 1.25 mg/dL — ABNORMAL HIGH (ref 0.57–1.00)
EGFR: 46 mL/min/{1.73_m2} — ABNORMAL LOW
GLUCOSE: 115 mg/dL — ABNORMAL HIGH (ref 70–99)
PHOSPHORUS, SERUM: 3.5 mg/dL (ref 3.0–4.3)
POTASSIUM: 4.3 mmol/L (ref 3.5–5.2)
SODIUM: 140 mmol/L (ref 134–144)

## 2023-03-27 LAB — MAGNESIUM: MAGNESIUM: 1.9 mg/dL (ref 1.6–2.3)

## 2023-03-28 DIAGNOSIS — A041 Enterotoxigenic Escherichia coli infection: Secondary | ICD-10-CM | POA: Diagnosis not present

## 2023-03-28 DIAGNOSIS — E1122 Type 2 diabetes mellitus with diabetic chronic kidney disease: Secondary | ICD-10-CM | POA: Diagnosis not present

## 2023-03-28 DIAGNOSIS — T8613 Kidney transplant infection: Secondary | ICD-10-CM | POA: Diagnosis not present

## 2023-03-28 DIAGNOSIS — B9620 Unspecified Escherichia coli [E. coli] as the cause of diseases classified elsewhere: Secondary | ICD-10-CM | POA: Diagnosis not present

## 2023-03-28 DIAGNOSIS — I110 Hypertensive heart disease with heart failure: Secondary | ICD-10-CM | POA: Diagnosis not present

## 2023-03-28 DIAGNOSIS — I509 Heart failure, unspecified: Secondary | ICD-10-CM | POA: Diagnosis not present

## 2023-03-28 DIAGNOSIS — N189 Chronic kidney disease, unspecified: Secondary | ICD-10-CM | POA: Diagnosis not present

## 2023-03-28 DIAGNOSIS — R7881 Bacteremia: Secondary | ICD-10-CM | POA: Diagnosis not present

## 2023-03-28 DIAGNOSIS — G9341 Metabolic encephalopathy: Secondary | ICD-10-CM | POA: Diagnosis not present

## 2023-03-28 DIAGNOSIS — N12 Tubulo-interstitial nephritis, not specified as acute or chronic: Secondary | ICD-10-CM | POA: Diagnosis not present

## 2023-03-28 DIAGNOSIS — D631 Anemia in chronic kidney disease: Secondary | ICD-10-CM | POA: Diagnosis not present

## 2023-03-28 LAB — CBC W/ DIFFERENTIAL
BANDED NEUTROPHILS ABSOLUTE COUNT: 0 10*3/uL (ref 0.0–0.1)
BASOPHILS ABSOLUTE COUNT: 0 10*3/uL (ref 0.0–0.2)
BASOPHILS RELATIVE PERCENT: 1 %
EOSINOPHILS ABSOLUTE COUNT: 0.1 10*3/uL (ref 0.0–0.4)
EOSINOPHILS RELATIVE PERCENT: 2 %
HEMATOCRIT: 33.7 % — ABNORMAL LOW (ref 34.0–46.6)
HEMOGLOBIN: 11.1 g/dL (ref 11.1–15.9)
IMMATURE GRANULOCYTES: 0 %
LYMPHOCYTES ABSOLUTE COUNT: 1.1 10*3/uL (ref 0.7–3.1)
LYMPHOCYTES RELATIVE PERCENT: 22 %
MEAN CORPUSCULAR HEMOGLOBIN CONC: 32.9 g/dL (ref 31.5–35.7)
MEAN CORPUSCULAR HEMOGLOBIN: 31.1 pg (ref 26.6–33.0)
MEAN CORPUSCULAR VOLUME: 94 fL (ref 79–97)
MONOCYTES ABSOLUTE COUNT: 0.4 10*3/uL (ref 0.1–0.9)
MONOCYTES RELATIVE PERCENT: 8 %
NEUTROPHILS ABSOLUTE COUNT: 3.5 10*3/uL (ref 1.4–7.0)
NEUTROPHILS RELATIVE PERCENT: 67 %
PLATELET COUNT: 109 10*3/uL — ABNORMAL LOW (ref 150–450)
RED BLOOD CELL COUNT: 3.57 x10E6/uL — ABNORMAL LOW (ref 3.77–5.28)
RED CELL DISTRIBUTION WIDTH: 13.1 % (ref 11.7–15.4)
WHITE BLOOD CELL COUNT: 5.2 10*3/uL (ref 3.4–10.8)

## 2023-03-28 NOTE — Unmapped (Signed)
Immunocompromised Host Infectious Diseases Complex Outpatient Antimicrobial Therapy (COpAT) Service    Julie [name]  is a 72 y.o. female followed by ICID COpAT program for transplant pyelonephritis.  Allergies: Gabapentin and Valacyclovir  Microbiology:   12/25/22 - Urine culture - Clean catch; Escherichia coli (S: cefepime, erta, gent, mero, nitro, tobra -- R: amp, aztreo, cefazolin, ceftriax, cephalexin, ciproflox, levoflox, tet, TMP/SMX -- I: ceftaz)  Antimicrobial regimen: ertapenem 500mg  q24h  Antimicrobial start date: 01/28/23  Antimicrobial end date (contingent upon): 04/07/23, to be confirmed  Line Access (date of placement): [facility], 02/06/23    Relevant results:    Lab Results   Component Value Date    WBC 5.2 03/27/2023    WBC 5.6 03/20/2023    WBC 5.8 03/12/2023    NEUTROABS 3.5 03/27/2023    NEUTROABS 3.9 03/20/2023    NEUTROABS 4.5 03/12/2023    EOSABS 0.1 03/27/2023    EOSABS 0.1 03/20/2023    EOSABS 0.1 03/12/2023    PLT 109 (L) 03/27/2023    PLT 112 (L) 03/20/2023    PLT 122 (L) 03/12/2023     Lab Results   Component Value Date    BUN 40 (H) 03/27/2023    BUN 35 (H) 03/20/2023    BUN 38 (H) 03/12/2023    CREATININE 1.25 (H) 03/27/2023    CREATININE 1.50 (H) 03/20/2023    CREATININE 1.38 (H) 03/12/2023     Lab Results   Component Value Date    ALBUMIN 4.4 03/12/2023    ALBUMIN 4.2 03/05/2023    ALBUMIN 4.0 02/19/2023     Lab Results   Component Value Date    ALKPHOS 62 03/05/2023    ALKPHOS 57 02/03/2023    ALKPHOS 43 (L) 01/28/2023    BILITOT 0.3 03/05/2023    BILITOT 0.2 (L) 02/03/2023    BILITOT 0.4 01/28/2023    ALT 8 03/05/2023    ALT <7 (L) 02/03/2023    ALT 8 (L) 01/28/2023    AST 10 03/05/2023    AST 11 02/03/2023    AST 11 01/28/2023     Assessment:  Scr stable at ~1.3-1.5 mg/dL. Est CrCl 26-30 ml/min. Dose appropriate.   WBC, ABS NEUTRO, ABS EOS WNL. Slightly low Plt, stable. She has known chronic idiopathic thrombocytopenia.  Remainder of monitoring labs WNL/stable. Laboratory Monitoring:   CBC w/differential once weekly.  CMP once weekly.     Next scheduled labs: week of 04/09/23    Next ICID appointment: 04/05/23 with [name]    Action taken:  1. Continue ertapenem 500mg  q24h.  2. Time spent on documentation and coordination of care:5 minutes    Julie [name] PharmD. Candidate    Julie [name]  [facility] COpAT Program  Phone: [phone]  Fax:  [phone]

## 2023-03-29 ENCOUNTER — Ambulatory Visit: Admit: 2023-03-29 | Discharge: 2023-03-30 | Payer: MEDICARE

## 2023-03-29 DIAGNOSIS — Z940 Kidney transplant status: Secondary | ICD-10-CM | POA: Diagnosis not present

## 2023-03-29 DIAGNOSIS — E8721 Acute metabolic acidosis: Secondary | ICD-10-CM | POA: Diagnosis not present

## 2023-03-29 DIAGNOSIS — D638 Anemia in other chronic diseases classified elsewhere: Secondary | ICD-10-CM | POA: Diagnosis not present

## 2023-03-29 DIAGNOSIS — Z79899 Other long term (current) drug therapy: Secondary | ICD-10-CM | POA: Diagnosis not present

## 2023-03-29 DIAGNOSIS — E1121 Type 2 diabetes mellitus with diabetic nephropathy: Secondary | ICD-10-CM | POA: Diagnosis not present

## 2023-03-29 DIAGNOSIS — Z794 Long term (current) use of insulin: Secondary | ICD-10-CM | POA: Diagnosis not present

## 2023-03-29 DIAGNOSIS — E039 Hypothyroidism, unspecified: Secondary | ICD-10-CM | POA: Diagnosis not present

## 2023-03-29 LAB — RENAL FUNCTION PANEL
ALBUMIN: 3.9 g/dL (ref 3.4–5.0)
ANION GAP: 10 mmol/L (ref 5–14)
BLOOD UREA NITROGEN: 45 mg/dL — ABNORMAL HIGH (ref 9–23)
BUN / CREAT RATIO: 43
CALCIUM: 9.1 mg/dL (ref 8.7–10.4)
CHLORIDE: 111 mmol/L — ABNORMAL HIGH (ref 98–107)
CO2: 22.1 mmol/L (ref 20.0–31.0)
CREATININE: 1.04 mg/dL — ABNORMAL HIGH
EGFR CKD-EPI (2021) FEMALE: 57 mL/min/{1.73_m2} — ABNORMAL LOW (ref >=60)
GLUCOSE RANDOM: 150 mg/dL (ref 70–179)
PHOSPHORUS: 4.5 mg/dL (ref 2.4–5.1)
POTASSIUM: 4.3 mmol/L (ref 3.4–4.8)
SODIUM: 143 mmol/L (ref 135–145)

## 2023-03-29 LAB — CBC W/ AUTO DIFF
BASOPHILS ABSOLUTE COUNT: 0 10*9/L (ref 0.0–0.1)
BASOPHILS RELATIVE PERCENT: 0.5 %
EOSINOPHILS ABSOLUTE COUNT: 0.1 10*9/L (ref 0.0–0.5)
EOSINOPHILS RELATIVE PERCENT: 1.7 %
HEMATOCRIT: 31.1 % — ABNORMAL LOW (ref 34.0–44.0)
HEMOGLOBIN: 10.4 g/dL — ABNORMAL LOW (ref 11.3–14.9)
LYMPHOCYTES ABSOLUTE COUNT: 0.9 10*9/L — ABNORMAL LOW (ref 1.1–3.6)
LYMPHOCYTES RELATIVE PERCENT: 18.1 %
MEAN CORPUSCULAR HEMOGLOBIN CONC: 33.4 g/dL (ref 32.0–36.0)
MEAN CORPUSCULAR HEMOGLOBIN: 31.2 pg (ref 25.9–32.4)
MEAN CORPUSCULAR VOLUME: 93.5 fL (ref 77.6–95.7)
MEAN PLATELET VOLUME: 8.7 fL (ref 6.8–10.7)
MONOCYTES ABSOLUTE COUNT: 0.4 10*9/L (ref 0.3–0.8)
MONOCYTES RELATIVE PERCENT: 7.3 %
NEUTROPHILS ABSOLUTE COUNT: 3.8 10*9/L (ref 1.8–7.8)
NEUTROPHILS RELATIVE PERCENT: 72.4 %
PLATELET COUNT: 95 10*9/L — ABNORMAL LOW (ref 150–450)
RED BLOOD CELL COUNT: 3.32 10*12/L — ABNORMAL LOW (ref 3.95–5.13)
RED CELL DISTRIBUTION WIDTH: 13.4 % (ref 12.2–15.2)
WBC ADJUSTED: 5.2 10*9/L (ref 3.6–11.2)

## 2023-03-29 LAB — BK VIRUS QUANTITATIVE PCR, BLOOD: BK BLOOD RESULT: NOT DETECTED

## 2023-03-29 LAB — CMV DNA, QUANTITATIVE, PCR: CMV VIRAL LD: NOT DETECTED

## 2023-03-29 LAB — MAGNESIUM: MAGNESIUM: 1.9 mg/dL (ref 1.6–2.6)

## 2023-03-29 LAB — TACROLIMUS LEVEL: TACROLIMUS BLOOD: 4.9 ng/mL (ref 2.0–20.0)

## 2023-03-29 MED ADMIN — belatacept (NULOJIX) 287.5 mg in sodium chloride (NS) 0.9 % 100 mL IVPB: 5 mg/kg | INTRAVENOUS | @ 15:00:00 | Stop: 2023-03-29

## 2023-03-29 MED ADMIN — heparin, porcine (PF) 100 unit/mL injection 200 Units: 200 [IU] | INTRAVENOUS | @ 15:00:00 | Stop: 2023-03-29

## 2023-03-29 NOTE — Unmapped (Signed)
[facility] infusion, S/P Kidney Transplant .  Patient has received previous doses of Belatacept in the Transplant Clinic and tolerated them well. Patient denies any new concerns or questions, denies any changes to her medical history, medications and allergies reviewed by Clinical research associate. She have SLtunnel catheter  which flushes well and good blood return check with her provider to use the line for this infusion and order for heparin flush., patient tolerated well, labs obtained per standing orders, no premeds ordered. Call light at patient's side, pt instructed on how to use.     1154 Belatacept 287.5 mg in 100 ml started per provider order     1125 Belatacept complete.      Patient tolerated infusion with no ill effects, central line flushed with normal saline and heparin  per infusion protocol, Vitals stable and comparable to baseline. Patient discharged from Infusion Center in stable condition with no acute distress. Next infusion appointment made for 2 weeks.  Being a first dose today she has been Monitor her post infusion observation

## 2023-03-30 LAB — TACROLIMUS LEVEL, TROUGH: TACROLIMUS, TROUGH: 10.2 ng/mL (ref 5.0–15.0)

## 2023-04-02 NOTE — Unmapped (Signed)
IMMUNOCOMPROMISED HOST INFECTIOUS DISEASE PROGRESS NOTE    Assessment/Plan:     [name] is a 72 y.o. 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 Had perinephric drain in place postoperatively, removed 08/30/22  - Immunosuppression: Tacrolimus (goal 5-7), currently transitioning from tac to belatacept with plan to stop tac on 04/26/23, MMF 500 BID (reduced 250mg  bid on 7/6) Pred 5  - Prophylaxis: valganciclovir x 3 months (mod risk, completed), TMP+SMX x 6 months (completed)  - Estimated Creatinine Clearance: 38.2 mL/min (A) (based on SCr of 1.04 mg/dL (H)).     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 Dubliensis 08/05/22, <10K, negative recipient samples, negative donor blood cx, treated with 2 weeks PO fluconazole  # Positive Hep B sAgb and core antibodies, c/w resolved infection, with moderate risk for reactivation 02/2021  - 12/28/22 HBV DNA not detected and HBsAg negative  # Recurrent MDR E.Coli Bacteremia/UTI  - 1/29 BCx/UCx E. Coli - s/p Levo x 3 weeks (thru stent removal, 3/15) (S: Cefazolin, Cipro/Levo, Pip-tazo, Cephalexin; R: TMP-SMX, Amp, Amp-sulb)  - 3/14 BCx/UCx ESBL/FQ-R E. Coli (S: Erta, Gent, Mero, Tobra, Cefepime SDD; I: ceftaz; R: Cipro/Levo, Cefazolin, Ceftriaxone, Amp) s/p 14 days Meropenem   - 4/13 Bcx/Ucx E. Coli (R CFZ, CTX, cipro/levo, I ceftaz, SDD CFP, susc nitro) s/p mero x 2 weeks  - 5/7 seen by urogyn, no structural issues planned to stop nitrofurantoin with switch to methenamine (not done), added vaginal estrogen/d-mannose.  - 6/3 UCx <10,000 E. Coli (S: Cefepime SDD; Erta/Mero, Gent, Nitrofurantoin, Tobra) s/p ertapenem until 01/15/23  - 4/29 started nitrofurantoin prophylaxis; not restarted after 6/24 due to reduced GFR and breakthrough in 12/2022     Active infection:  #Perinephric fluid collection 10/05/22, slowly improving 01/27/23  - 3/14 BCx/UCx ESBL/FQ-R E. Coli (S: Erta, Gent, Mero, Tobra, Cefepime SDD; I: ceftaz; R: Cipro/Levo, Cefazolin, Ceftriaxone, Amp) s/p 14 days Meropenem   - 3/19 IR guided drainage negative cx, Cr 1.5 not suggestive of urinoma  - 4/13 increased stranding, 4/16 not amenable to intervention per VIR  - 01/27/23 CT a/p 7.4 x 1.0 x 5.4 cm crescentic fluid collection within the soft tissues lateral to the transplant kidney (8.3 x 0.9 x 5.5 cm on 11/05/22)  - 7/10 VIR: collection not large enough to aspirate     # Fever and septic shock, presumed urinary source given pyuria and recurrent UTI history 01/27/23  - 7/6 3x emesis in the ED with sudden acute change in mental status, prompting intubation for airway protection --> extubated 7/7  -  7/6 flu/RSV/covid neg; UCx <1,000 CFU (appears on plates as E. Coli); BCx (NG)  - 7/6 CXR more likely edema or aspiration then pneumonia  - 7/6 CT A/P: Bibasilar consolidation. No hydronephrosis. No solid renal mass. Atrophic bilateral native kidneys. Similar appearance of nonobstructive punctate calculi and vascular calcifications. Subcentimeter hypoattenuating lesions within the renal transplant, too small to characterize. No hydronephrosis. Similar inflammatory stranding surrounding the transplant kidney, particularly around the hilum. 7.4 x 1.0 x 5.4 cm crescentic fluid collection within the soft tissues lateral to the transplant kidney (8.3 x 0.9 x 5.5 cm on 11/05/22)  - 7/7 renal transplant [place]: The renal transplant was located in the left lower quadrant. Normal size and echogenicity. 2 subcentimeter anechoic cysts are present. There is pelvic caliectasis.   - 04/05/23 CT A/P read pending, fluid collection appears improved on my read (non-contrasted scan) but small fluid collection still present  Rx 7/6 vanc/cefepime -> 7/7 Vanc, mero--> 7/8 meropenem--> 7/9 Ertapenem with plan for at least 8 weeks of therapy and duration depending on clinical and radiologic improvement     Antimicrobial allergy/intolerance:   Penicillin (see allergy list for nuances)  Valacyclovir - nausea        RECOMMENDATIONS  She looks quite well in clinic today, accompanied by her husband. Tolerating the ertapenem well, no diarrhea. She has this persistent small fluid collection around the transplanted kidney that has been too small to sample. I'd like to get one more CT today (already completed) before stopping abx. Hard to assess for stranding given lack of contrast, but on my initial review of the images it does look like the fluid collection is smaller (albeit it does seem like there's still a small fluid collection around her transplanted kidney). I discussed with PharmD team starting nitrofurantoin, but given her borderline renal function and age we agree that risks outweigh benefits. I hope she can afford fosfomycin once weekly--we have sent to [facility] pharmacy.       Diagnosis  Fu official read of CT A/P to help determine ertapenem plan    Management  Continue ertapenem for now, final plan depending on CT A/P read  Will need powerline removal addressed when she completes abx, I've scheduled this for 9/16 at [facility] @ 2:20 pm since she has a neurology visit that morning at [facility] anyway. If we decide to continue this appt will need to be rescheduled  For recurrent UTI, continue vaginal estrogen (confirmed she is taking this)  I'm wondering if fosfomycin is still cost-prohibitive,  script sent to [facility] pharmacy as if stopping ertapenem I'd like to have her on a ppx abx if possible    Antimicrobial prophylaxis required for transplant immunosuppression   She is 6 months out from transplant, completed TMP+SMX ppx per protocol  She has completed valganciclovir ppx, but I'd consider starting valacyclovir ppx in the future given her history of shingles, recognizing the risks of polypharmacy for her    Intensive toxicity monitoring for prescription antimicrobials   CBC w/diff at least once per week  CMP at least once per week  clinical assessments for rashes or other skin changes    Follow up in 4-6 weeks here at [facility], but I will call her before then with an antibiotic plan          Recommendations were communicated via shared medical record.    Audley Hose, MD  [facility] Division of Infectious Diseases    Subjective     External record(s):  Reviewed Dr.  Footer's notes. Cr stable, plts slightly low but stable. Reviewed transplant nephrology notes, patient transitioning from tacrolimus to belatacept. She saw her PCP on 03/12/23 for annual exam .    Independent historian(s): no independent historian required.       Interval History:   Per above  Doing well on the ertapenem  No diarrhea  No fevers  Taking vaginal estrogen (she says she is taking three times per week)  No issues with her line  Unfortunately they got a big bill for her ertapenem and husband was wondering what that's about, encouraged them to call their [facility] company and insurance     Medications:    Current Outpatient Medications:     alendronate (FOSAMAX) 70 MG tablet, Take 1 tablet (70 mg total) by mouth every seven (7) days., Disp: , Rfl:     amlodipine (NORVASC) 5 MG tablet, Take 1 tablet (5 mg total) by mouth every twelve (12) hours., Disp: 90 tablet, Rfl: 3    atorvastatin (LIPITOR) 10 MG tablet, Take 1 tablet (10 mg total) by mouth daily., Disp: 90 tablet, Rfl: 3    blood-glucose sensor (DEXCOM G7 SENSOR) Devi, Use 1 sensor every 10 days., Disp: 3 each, Rfl: 11    carvedilol (COREG) 25 MG tablet, Take 1 tablet (25 mg total) by mouth two (2) times a day., Disp: 180 tablet, Rfl: 3    chlorthalidone (HYGROTON) 25 MG tablet, Take 2 tablets (50 mg total) by mouth daily., Disp: 180 tablet, Rfl: 3    cholecalciferol, vitamin D3 25 mcg, 1,000 units,, (CHOLECALCIFEROL-25 MCG, 1,000 UNIT,) 1,000 unit (25 mcg) tablet, Take 2 tablets (50 mcg total) by mouth daily., Disp: , Rfl:     cranberry extract/vitamin C (AZO CRANBERRY PLUS VIT C ORAL), Take 2 tablets by mouth in the morning., Disp: , Rfl:     D-MANNOSE ORAL, Take 2,000 mg by mouth in the morning., Disp: , Rfl:     ertapenem 500 mg, OVERFILL 7 mL in sodium chloride 0.9 % 50 mL IVPB, Infuse 500 mg into a venous catheter daily., Disp: , Rfl:     estradiol (ESTRACE) 0.01 % (0.1 mg/gram) vaginal cream, Insert 2 g into the vagina nightly., Disp: 60 g, Rfl: 11    hydrALAZINE (APRESOLINE) 100 MG tablet, Take 1 tablet (100 mg total) by mouth Three (3) times a day., Disp: 270 tablet, Rfl: 3    insulin aspart (NOVOLOG FLEXPEN SUBQ), Inject 0-8 Units under the skin Three (3) times a day before meals. *Max daily dose: 33 units*, Disp: , Rfl:     insulin glargine (BASAGLAR, LANTUS) 100 unit/mL (3 mL) injection pen, Inject 0.05 mL (5 Units total) under the skin nightly., Disp: , Rfl:     lancets Misc, Use to check blood sugar as directed with insulin 3 times a day & for symptoms of high or low blood sugar., Disp: 100 each, Rfl: 0    levothyroxine (SYNTHROID) 112 MCG tablet, Take 1 tablet (112 mcg total) by mouth daily., Disp: 90 tablet, Rfl: 3    magnesium oxide (MAG-OX) 400 mg (241.3 mg elemental magnesium) tablet, Take 1 tablet (400 mg total) by mouth two (2) times a day., Disp: 60 tablet, Rfl: 11    mycophenolate (CELLCEPT) 250 mg capsule, Take 1 capsule (250 mg total) by mouth two (2) times a day., Disp: 180 capsule, Rfl: 3    pantoprazole (PROTONIX) 40 MG tablet, Take 1 tablet (40 mg total) by mouth daily as needed., Disp: , Rfl:  predniSONE (DELTASONE) 5 MG tablet, Take 1 tablet (5 mg total) by mouth in the morning., Disp: 90 tablet, Rfl: 3    sodium bicarbonate 650 mg tablet, Take 1 tablet (650 mg total) by mouth Three (3) times a day., Disp: 270 tablet, Rfl: 3    spironolactone (ALDACTONE) 25 MG tablet, Take 1 tablet (25 mg total) by mouth daily., Disp: 90 tablet, Rfl: 3    tacrolimus (PROGRAF) 0.5 MG capsule, Take 3 capsules (1.5 mg total) by mouth daily AND 2 capsules (1 mg total) nightly., Disp: 450 capsule, Rfl: 3    Objective     Vital signs:  There were no vitals taken for this visit.    Physical Exam:  Const [x]  vital signs above    []  NAD, non-toxic appearance []  Chronically ill-appearing, non-distressed        Eyes []  Lids normal bilaterally, conjunctiva anicteric and noninjected OU     [] PERRL  [] EOMI        ENMT []  Normal appearance of external nose and ears, no nasal discharge        []  MMM, no lesions on lips or gums [x]  No thrush, leukoplakia, oral lesions  []  Dentition good []  Edentulous []  Dental caries present  []  Hearing normal  []  TMs with good light reflexes bilaterally         Neck []  Neck of normal appearance and trachea midline        []  No thyromegaly, nodules, or tenderness   []  Full neck ROM        Lymph []  No LAD in neck     []  No LAD in supraclavicular area     []  No LAD in axillae   []  No LAD in epitrochlear chains     []  No LAD in inguinal areas        CV []  RRR            []  No peripheral edema     []  Pedal pulses intact   []  No abnormal heart sounds appreciated   []  Extremities WWP   Systolic murmur present (chronic)      Resp [x]  Normal WOB at rest    []  No breathlessness with speaking, no coughing  []  CTA anteriorly    [x]  CTA posteriorly          GI []  Normal inspection, NTND   []  NABS     []  No umbilical hernia on exam       []  No hepatosplenomegaly     []  Inspection of perineal and perianal areas normal  No tenderness over transplanted kidney      GU []  Normal external genitalia     [] No urinary catheter present in urethra   []  No CVA tenderness    []  No tenderness over renal allograft        MSK []  No clubbing or cyanosis of hands       []  No vertebral point tenderness  []  No focal tenderness or abnormalities on palpation of joints in RUE, LUE, RLE, or LLE        Skin []  No rashes, lesions, or ulcers of visualized skin     []  Skin warm and dry to palpation   Tunneled line in chest C/D/I      Neuro [x]  Face expression symmetric  []  Sensation to light touch grossly intact throughout    []  Moves extremities equally    []  No tremor noted        []   CNs II-XII grossly intact     []  DTRs normal and symmetric throughout []  Gait unremarkable        Psych [x]  Appropriate affect       [x]  Fluent speech         []  Attentive, good eye contact  [x]  Oriented to person, place, time          []  Judgment and insight are appropriate           Data for Medical Decision Making     I discussed drug options and/or interactions w/pharmD discussed nitrofurantoin dosing and fosfomycin cost with pharmD, 6/3 urine cx S to nitrofurantoin .    I reviewed CBC results (WBC normal), chemistry results (Cr improved), and micro result(s) (8/16 urine cx <10,00 CFU, 12/25/22 urine culture E. Coli S to nitrofurantoin ).    I independently visualized/interpreted CT images (CT A/P with still present but improved fluid collection (radiology read not back)).       Recent Labs     Units 03/29/23  0956   WBC 10*9/L 5.2   HGB g/dL 16.1*   PLT 09*6/E 95*   NEUTROABS 10*9/L 3.8   LYMPHSABS 10*9/L 0.9*   EOSABS 10*9/L 0.1   BUN mg/dL 45*   CREATININE mg/dL 4.54*   K mmol/L 4.3   MG mg/dL 1.9   PHOS mg/dL 4.5   CALCIUM mg/dL 9.1       Lab Results   Component Value Date    Tacrolimus, Trough 10.2 03/29/2023 Total IgG 647 (L) 01/28/2023       Microbiology:  @RSLTMICRO @    Imaging:  No results found.

## 2023-04-04 DIAGNOSIS — I110 Hypertensive heart disease with heart failure: Secondary | ICD-10-CM | POA: Diagnosis not present

## 2023-04-04 DIAGNOSIS — T8613 Kidney transplant infection: Secondary | ICD-10-CM | POA: Diagnosis not present

## 2023-04-04 DIAGNOSIS — N189 Chronic kidney disease, unspecified: Secondary | ICD-10-CM | POA: Diagnosis not present

## 2023-04-04 DIAGNOSIS — R7881 Bacteremia: Secondary | ICD-10-CM | POA: Diagnosis not present

## 2023-04-04 DIAGNOSIS — B9620 Unspecified Escherichia coli [E. coli] as the cause of diseases classified elsewhere: Secondary | ICD-10-CM | POA: Diagnosis not present

## 2023-04-04 DIAGNOSIS — A041 Enterotoxigenic Escherichia coli infection: Secondary | ICD-10-CM | POA: Diagnosis not present

## 2023-04-04 DIAGNOSIS — G9341 Metabolic encephalopathy: Secondary | ICD-10-CM | POA: Diagnosis not present

## 2023-04-04 DIAGNOSIS — N12 Tubulo-interstitial nephritis, not specified as acute or chronic: Secondary | ICD-10-CM | POA: Diagnosis not present

## 2023-04-04 DIAGNOSIS — I509 Heart failure, unspecified: Secondary | ICD-10-CM | POA: Diagnosis not present

## 2023-04-04 DIAGNOSIS — E1122 Type 2 diabetes mellitus with diabetic chronic kidney disease: Secondary | ICD-10-CM | POA: Diagnosis not present

## 2023-04-04 DIAGNOSIS — D631 Anemia in chronic kidney disease: Secondary | ICD-10-CM | POA: Diagnosis not present

## 2023-04-05 ENCOUNTER — Ambulatory Visit: Admit: 2023-04-05 | Discharge: 2023-04-05 | Payer: MEDICARE

## 2023-04-05 DIAGNOSIS — N390 Urinary tract infection, site not specified: Secondary | ICD-10-CM | POA: Diagnosis not present

## 2023-04-05 DIAGNOSIS — N151 Renal and perinephric abscess: Secondary | ICD-10-CM | POA: Diagnosis not present

## 2023-04-05 DIAGNOSIS — T8619 Other complication of kidney transplant: Secondary | ICD-10-CM | POA: Diagnosis not present

## 2023-04-05 DIAGNOSIS — Z4822 Encounter for aftercare following kidney transplant: Secondary | ICD-10-CM | POA: Diagnosis not present

## 2023-04-05 DIAGNOSIS — Z940 Kidney transplant status: Secondary | ICD-10-CM | POA: Diagnosis not present

## 2023-04-05 MED ORDER — FOSFOMYCIN TROMETHAMINE 3 GRAM ORAL PACKET
ORAL | 0 refills | 84 days | Status: CP
Start: 2023-04-05 — End: 2023-06-22

## 2023-04-05 NOTE — Unmapped (Signed)
Called & left a VM regarding questionnaire for up coming appointment on 04/09/23. Left my direct line for a returned call.

## 2023-04-06 NOTE — Unmapped (Signed)
Addended by: Audley Hose on: 04/06/2023 09:12 AM     Modules accepted: Orders

## 2023-04-06 NOTE — Unmapped (Signed)
Spoke with home infusion pharmacy this morning and informed them that the patient will be stopping ertapenem as planned with line removal scheduled for 9/16.

## 2023-04-09 ENCOUNTER — Ambulatory Visit: Admit: 2023-04-09 | Discharge: 2023-04-09 | Payer: MEDICARE

## 2023-04-09 DIAGNOSIS — Z452 Encounter for adjustment and management of vascular access device: Secondary | ICD-10-CM | POA: Diagnosis not present

## 2023-04-09 DIAGNOSIS — E785 Hyperlipidemia, unspecified: Secondary | ICD-10-CM | POA: Diagnosis not present

## 2023-04-09 DIAGNOSIS — G9340 Encephalopathy, unspecified: Secondary | ICD-10-CM | POA: Diagnosis not present

## 2023-04-09 DIAGNOSIS — Z992 Dependence on renal dialysis: Secondary | ICD-10-CM | POA: Diagnosis not present

## 2023-04-09 DIAGNOSIS — Z940 Kidney transplant status: Secondary | ICD-10-CM | POA: Diagnosis not present

## 2023-04-09 DIAGNOSIS — R4189 Other symptoms and signs involving cognitive functions and awareness: Secondary | ICD-10-CM | POA: Diagnosis not present

## 2023-04-09 DIAGNOSIS — I120 Hypertensive chronic kidney disease with stage 5 chronic kidney disease or end stage renal disease: Secondary | ICD-10-CM | POA: Diagnosis not present

## 2023-04-09 DIAGNOSIS — E1122 Type 2 diabetes mellitus with diabetic chronic kidney disease: Secondary | ICD-10-CM | POA: Diagnosis not present

## 2023-04-09 DIAGNOSIS — R29818 Other symptoms and signs involving the nervous system: Secondary | ICD-10-CM | POA: Diagnosis not present

## 2023-04-09 DIAGNOSIS — N186 End stage renal disease: Secondary | ICD-10-CM | POA: Diagnosis not present

## 2023-04-09 DIAGNOSIS — T8619 Other complication of kidney transplant: Principal | ICD-10-CM

## 2023-04-09 DIAGNOSIS — N151 Renal and perinephric abscess: Principal | ICD-10-CM

## 2023-04-09 NOTE — Unmapped (Signed)
[facility]   [address], [address]  [place], [place] [address]  [phone]     COGNITIVE-BEHAVIORAL NEUROLOGY   Neurocognitive Assessment Battery    Date: April 09, 2023   Patient Name: Julie [name]   MRN: [id number]     TEST SCORES:       Note: This summary of test scores accompanies the interpretive report and should not be considered in isolation without reference to the appropriate sections in the text. Descriptors are based on appropriate normative data and may be adjusted based on clinical judgment. The terms ???impaired??? and ???within normal limits (WNL)??? are used when a more specific level of functioning cannot be determined.   Overall Cognitive Status:        Raw Score      MoCA Total 10 /30  Severely Impaired   MoCA Orientation 4 /6  Difficulty with Place   Memory:       MoCA Raw Score         Delayed Recall 0 /5  Impaired      Memory Index Score 5 /15  Impaired          BVMT Raw Score  Percentile    Total Trials 1-3 9 /36 2 Well Below Average   Delayed Recall 5 /12 12 Below Average   Recognition Discrimination Index 3   Exceptionally Low   Recognition Hits 6 /6 --- Within Normal Limits   False Positives 3  --- Exceptionally Low          Attention/Executive Function:       MoCA Raw Score         Trails 0 /1  Impaired      Forward Digits 0 /1  Impaired      Backward Digits 1 /1  Intact      Vigilance 0 /1  Impaired     Serial 7 Subtractions 1 /3  Impaired           Raw  Percentile    WAIS-IV Coding 28  6 Well Below Average          Trail Making Test Raw Score   Percentile    Part A 67 secs., 0 errors  2 Well Below Average   Part B 266 secs., 3 errors   <1 Exceptionally Low          WAIS-IV Digit Span: Scaled Score  Percentile    Forward 3,  longest span (3)  1 Exceptionally Low   Backward 5,  longest span (2)  5 Well Below Average   Language:       MoCA Raw Score         Naming 2 /3  Difficulty      Sentence Repetition 0 /2  Impaired      Verbal Fluency 0 /1  Impaired Abstraction 0 /2  Impaired          Visuospatial/Visuoconstruction:        Raw Score  Percentile    MoCA Figure Copy 0 /1  Impaired   MoCA Clock Drawing 2 /3  Slight difficulty with hands   BVMT-R Copy 12 /12 --- Average   Judgment of Line Orientation (JoLO) 15-Item Version 6 /15 --- Severely Impaired   VOSP Number Location Test 7 /10  Well Below Average     Patient and Informant Questionnaires  (paper only)        Raw Score  Percentile  Cognitive Symptoms: informant 0 --- --- No Complaints   Functional Activites Questionnaire 7 --- --- Mild Impairment   NPI-Q2 Severity 0 --- --- Within Normal Limits   NPI-Q2 Distress 0 --- --- Within Normal Limits       Referring Provider: Leafy Half, MD  Administered by Psychometrist Natale Milch, MAP, CSP :Time started: 09:50 Time completed: 10:55  Interpretation, discussion with patient/informant, and report by Julie Flow, MD taking a total of 35 minutes on 04/09/23              Interpretation:  This is a 72 y.o. yo female who presents for a cognitive evaluation together with her husband who serves as a collateral informant. She has 14 years of education and worked as a Theatre manager until retirement about 20 years ago.     On a screening test of current cognitive functioning (MoCA), she obtains a score in the severely impaired range, indicating that she is likely to have suffered a significant decline from previous levels of functioning. When examining subscales of this test, she shows marked weakness of delayed recall, language and visual-spatial processing, and moderate weakness of concentration and executive processing. These findings are corroborated by tests designed to evaluate individual cognitive domains in greater depth.      Performances on the following subtests were within normal limits:  visuospatial delayed recall    She performed in the moderately impaired range on tests of:   Coding  Backward Digit Span  VOSP    She performed in the severely impaired range on tests of:   Visuospatial Recognition  a speeded processing measure (assesses simple visuomotor speed-of-processing)  dual-counter working memory (felt to represent executive processing - the ability to dynamically allocate cognitive resources when faced with potential distractions)  forward digit span  judgment of line orientation (visuospatial processing)    Tasks involving language were omitted due to English being a second language and apparent difficulty on the screening assessment due to language barrier.     Mood/Behavioral Issues:  PHQ9=0; GAD7=0; NPIQ=0/0  This patient does not endorse many complaints that are associated with the presence of a mood disturbance in a geriatric population.  The collateral informant also denies the presence of problematic mood or behavioral issues in the patient.     Functional Ability:  Additionally, the collateral informant was asked a variety of questions on a survey of daily activities, and indicates that the patient shows a minimal dependence on others to assist with her needs.    Summary and recommendations:  Overall, this patient presents a cognitive profile (a pattern of cognitive strengths and weaknesses) that is consistent with a mild mixed cortical-subcortical form of dementia. Corroboration with clinical findings and brain imaging (if available) is advised.  - The pt. demonstrates a combination of visuospatial and complex working memory (executive processing) deficits, both of which are at least moderate in severity. This combination of deficits is thought to produce a particularly high risk for driving-related errors and accidents. The issue of driving cessation should therefore be addressed.

## 2023-04-09 NOTE — Unmapped (Signed)
Patient Name: Julie [name]  Patient Age: 72 y.o.  Encounter Date: 04/09/23   Interventional Radiology APP: Anice Paganini, PA    Referring Physician: Audley Hose, MD  [address]  [facility] Infectious Dis  [place],  [place] [address]  Primary Care Provider: Barbette Reichmann, MD      SUBJECTIVE      Reason for Visit: Tunneled central venous catheter removal    History of Present Illness: Julie [name] is a 72 y.o. 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 Patient has a tunneled PowerLine (single-lumen) in the left internal jugular vein that was placed by VIR on 02/06/23 for long-term IV antibiotics. Patient no longer requires the line and presents for removal.    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)     since July 2017    Hypertension     Hypothyroidism (acquired)     Kidney transplant status, cadaveric 08/08/2022    Type 2 diabetes mellitus (CMS-HCC)         Past Surgical History:   Procedure Laterality Date    CATARACT EXTRACTION      HYSTERECTOMY      OOPHORECTOMY      PERITONEAL CATHETER INSERTION      PR LAP INSERTION TUNNELED INTRAPERITONEAL CATHETER N/A 09/27/2018    Procedure: LAPAROSCOPY, SURGICAL; WITH INSERTION OF INTRAPERITONEAL CANNULA OR CATHETER, PERMANENT;  Surgeon: Leona Carry, MD;  Location: MAIN OR [place];  Service: Transplant    PR LAP INSERTION TUNNELED INTRAPERITONEAL CATHETER N/A 04/08/2019    Procedure: LAPAROSCOPY, SURGICAL; WITH INSERTION OF INTRAPERITONEAL CANNULA OR CATHETER, PERMANENT;  Surgeon: Leona Carry, MD;  Location: MAIN OR [place];  Service: Transplant    PR LAP REVISE INTRAPERITONEAL CATHETER N/A 08/26/2019    Procedure: LAPAROSCOPY, SURGICAL; W/REVIS PREV PLACED INTRAPERITONEAL CANNULA/CATH, REMOV INTRALUMIN OBSTRUCT MATERIAL;  Surgeon: Leona Carry, MD;  Location: MAIN OR [facility];  Service: Transplant    PR REDUCE VOLVULUS,INTUSS,INTERN HERNIA N/A 10/13/2022    Procedure: REDUCTION OF VOLVULUS, INTUSSUSCEPTION, INTERNAL HERNIA, BY LAPAROTOMY;  Surgeon: Suella Broad, MD;  Location: MAIN OR [place];  Service: Trauma    PR REMOVAL TUNNELED INTRAPERITONEAL CATHETER N/A 05/06/2018    Procedure: REMOVAL OF PERMANENT INTRAPERITONEAL CANNULA OR CATHETER;  Surgeon: Leona Carry, MD;  Location: MAIN OR [facility];  Service: Transplant    PR REMOVE PERITONEAL FOREIGN BODY N/A 04/08/2019    Procedure: Removal Of Peritoneal Of Foreign Body From Peritoneal Cavity;  Surgeon: Leona Carry, MD;  Location: MAIN OR [facility];  Service: Transplant    PR TRANSPLANT,PREP RENAL GRAFT/ARTERIAL Right 08/06/2022    Procedure: [facility] RECONSTRUCTION CADAVER/LIVING DONOR RENAL ALLOGRAFT PRIOR TO TRANSPLANT; ARTERIAL ANASTOMOSIS EAC;  Surgeon: Toledo, Lilyan Punt, MD;  Location: MAIN OR [facility];  Service: Transplant    PR TRANSPLANTATION OF KIDNEY Right 08/06/2022    Procedure: RENAL ALLOTRANSPLANTATION, IMPLANTATION OF GRAFT; WITHOUT RECIPIENT NEPHRECTOMY;  Surgeon: Toledo, Lilyan Punt, MD;  Location: MAIN OR [facility];  Service: Transplant    TUBAL LIGATION         Allergies:  Allergies   Allergen Reactions    Gabapentin Other (See Comments)     Shakes    Valacyclovir Nausea Only  and Other (See Comments)        OBJECTIVE     Vitals:    04/09/23 1348   BP: 163/57   Pulse: 66   Resp: 16   SpO2: 100%       Physical Exam:  General: alert, no apparent distress  Respiratory: non-labored breathing on room air  Cardiovascular: regular rate  Skin: tunneled CVC in place without surrounding erythema, induration, or purulent drainage    ASSESSMENT/PLAN     Julie [name] is a 72 y.o. female who presents for removal of tunneled central venous catheter.  --Prior to the procedure, a time out was performed. During the time out, the patient, procedure, allergies and procedure site when applicable were verbally verified by the team members and Anice Paganini, PA.   --tunneled PowerLine (single-lumen) removed intact without issue and sterile dressing applied.  --No further IR follow up required.    Anice Paganini, PA  04/09/23 2:13 PM

## 2023-04-09 NOTE — Unmapped (Signed)
Patient seen today by [facility] Provider. Patient in clinic for tunneled line removal, procedure completed without complication. VSS. A/O x4. Allergies and home medications reviewed. Smoking hx and pregnancy/menstrual hx reviewed (if pertinent to patient's biological gender). Patient was placed in room, gown/draping offered, site dressing and sutures removed, and sterile tray set at bedside. Site viewed, skin CDI. Patient placed in supine position. Medical Provider has lidocaine at bedside if needed to ensure painless removal of line. Tunneled line removed from left/right subclavian without complication. MD/APP dressed site with dry gauze and tegaderm. RN discussed discharge and after care instructions with patient and spouse/family/friend (who sat bedside). Patient verbally acknowledged understanding of instructions. RN addressed the use of [employer]. RN encouraged patient to call clinic [phone] for any further needs or concerns, clinic number provided.  AVS offered.

## 2023-04-09 NOTE — Unmapped (Signed)
[facility]   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 Their perspective is that she is doing so much better compared to several months ago, that they are not concerned. She appears to test worse than her at-home functional status. Prior encephalopathy superimposed on baseline vascular cognitive impairment is the most likely explanation for these findings, with residual impairment that is likely close to/back to baseline that is not recognized by either pt or husband as a mild dementia.     Recommendations:  Better hypertension control, aiming for SBP of 120 or less. This is fundamental to preventing further cognitive decline over time. The SPRINT Trial showed a substantial decrease in risk of both heart and brain adverse outcomes with aggressive BP control.  Consider a brain MRI to verify/review degree of white matter damage +/- lacunar strokes, if cognitive decline progresses rather than stabilizes or improves now that chronic infection has resolved.   She is apparently back to driving since May, and her husband believes she is doing so safely despite high-risk cognitive testing today. They did not accept my concern regarding driving, but husband was at least willing to sit with her as a passenger in the vehicle several times a month for ongoing safety monitoring. PCP should encourage driving cessation unless OT assessment can ensure driving safety.        Subjective   I had the pleasure of seeing Julie [name], a 72 y.o. yo female, in neurologic consultation at the request of Dr. Elvera Maria, Francine Graven, MD  in the company of husband Chase Picket, who was essential to providing a complete HPI.  Cognitively normal prior to her kidney transplant (was on dialysis for 7 years). Transplant Jan. 2024. Has been in and out of the hospital ever since with recurring infection/sepsis/encephalopathy.  Has been feeling well since mid-July when she was d/c'd home from the last bout of infection. She just finished a 7 week course of antibiotics, completed Sat. 9/14. Fluid around the kidney has resolved.     Cognitive: Both pt and husband deny trouble with her memory. They also deny significant decline in word-finding (English is her 2nd language), and she does not have trouble finding where she placed objects. Can use all household appliances and does the cooking without help. Husband notes that she had some confusion about dates and events when she was fighting infections, but none recently.   Neuropsychiatric: denies anxiety or depression. No hallucinations.   Sensory-Motor: no issues any longer with balance or walking. She uses a peddling machine for exercise or walks around her yard or to the mailbox.   IADLS: FAQr=7 currently; was 16 in April.   Hasn't done the bills or paperwork since January. Went back to driving 12 weeks after surgery; no tickets or accidents. Husband sat with her in the beginning but not anymore. She has been told to avoid crowds due to her immunosuppression. So no church since January. Has a neighbor who she spends time with and a friend who drives over to see  her.   Sleep: no insomnia. Denies need for naps.   Histories: all reviewed and amended as needed within Epic; not reiterated here        Objective    BP Readings from Last 3 Encounters:   04/09/23 170/61   04/05/23 181/58   03/29/23 156/55     Pulse Readings from Last 3 Encounters:   04/09/23 62   04/05/23 63   03/29/23 62      Wt Readings from Last 3 Encounters:   04/09/23 58.3 kg (128 lb 9 oz)   04/05/23 58.1 kg (128 lb)   03/29/23 57.2 kg (126 lb)       Neurocognitive testing:    Alert, cooperative, attentive. Oriented to person, place, day and date.   Affect broad.   Speech fluent, mild ataxic dysarthria?  Language grammatical, logical, appropriate.  Intact to naming, repetition and comprehension.   Remote recall and general fund of knowledge judged to be impaired  Insight judged to be impaired  Praxis judged to be mildly impaired (2/4 finger constructions)  Results of further assessment are documented separately as part of this encounter.    Cranial nerves:   Visual fields full.  Extraocular movements intact with good smooth pursuit.   Vertical and horizontal saccades intact.   Pupils equal and reactive to light.   Facial expression and sensation: intact  Hearing: grossly intact bilaterally  Shoulder shrug equal  Tongue and palate movements:  within normal limits.    No tongue atrophy or fasciculations.    Motor:   Involuntary movements absent.  Motor tone: WNL   Normal strength in the upper and lower extremities.   Finger-tapping performed well bilaterally.  Coordination:   RAM upper extremities: intact bilaterally   Toe-tapping lower extremities: intact bilaterally  Finger-nose-finger testing: intact bilaterally  Sensory:   Temperature sense: grossly intact in the distal extremities.   Vibratory sense:  grossly intact in the distal extremities.   Romberg sign: absent.    Reflexes:   2+ and symmetrical throughout.    Babinski sign absent bilaterally.   No grasp reflexes.   Station/Gait:   Arises from chair without using arms.   Narrow base, steady.   Casual gait: good arm swing, stride length, and stability on turning.   Tandem gait ok.    Laboratory testing:  TSH   Date Value Ref Range Status   12/25/2022 0.113 (L) 0.550 - 4.780 uIU/mL Final     RPR   Date Value Ref Range Status   08/06/2022 Nonreactive Nonreactive Final       Imaging:  Results for orders placed or performed during the hospital encounter of 01/27/23   CT head WO contrast    Narrative    EXAM: Computed tomography, head or brain without contrast material.  ACCESSION: [id number] UN  CLINICAL INDICATION: 72 years old Female with AMS, sezizure like activity, vomiting    COMPARISON: 11/05/2022  TECHNIQUE: Axial CT images of the head  from skull base to vertex without contrast.    FINDINGS:  There is no midline shift. No mass lesion. There is no evidence of acute infarct. No acute intracranial hemorrhage. Scattered and confluent hypodensities within the periventricular and deep white matter.  These are nonspecific but commonly associated with small vessel ischemic changes.  No fractures are evident. Mild mucous thickening of the right maxillary sinus. The other sinuses are clear.      Impression    No acute intracranial findings.  Billing by Time Attestation:   I personally spent 60 minutes face-to-face and non-face-to-face in the care of this patient, which includes all pre, intra, and post visit time on the date of service.  All documented time was specific to the E/M visit and does not include any procedures that may have been performed.      A written summary was provided to the patient at or shortly after the time of the visit either through MyChart or by email.

## 2023-04-09 NOTE — Unmapped (Signed)
Please work with your doctors to get your blood pressure down to 120/80.

## 2023-04-09 NOTE — Unmapped (Signed)
After your line removal:  Avoid strenuous activity and heavy lifting (greater than 10 pounds) for 1 week.  Do not submerge site until healed. No tub baths, swimming pools until healed.     Dressing stays intact for 2 days.  After two days, remove dressing and gently wash site.   Dry and apply new clean dressing.   Wash site and apply new dressing daily until healed.  If steri-strips are present, let them fall off on their own.     You may return to your usual diet unless otherwise directed.   Please call [facility] VIR clinic at [phone] and choose option 3 to speak to a nurse for any problems or questions following discharge including:     Bleeding that saturates bandage  Pain at site not controlled by regular pain medicines  Signs of infection at site: redness, swelling, pus draining, fever above 101.5     If site bleeds, sit upright and hold pressure to site.   For rapid, uncontrolled bleeding hold firm pressure and seek emergency medical attention.      Calls received during normal business hours (Monday through Friday from 8:00 am to 4:30 pm) will be returned within one business day. Calls received after normal business hours OR on weekends will be returned during the next business day.      If you are experiencing a medical emergency, please call 911 or go to your nearest emergency room.

## 2023-04-11 NOTE — Unmapped (Signed)
-  Called [facility] and left patient message to call the Urology Clinic back to reschedule the below appt(s):    Provider: Park Breed  Type of Appt: 11/12   Radiology: N/A      Asked the patient to call back at [phone].    Thank you,    Gildardo Pounds  Urology Clinic  [phone]

## 2023-04-12 ENCOUNTER — Ambulatory Visit: Admit: 2023-04-12 | Discharge: 2023-04-13 | Payer: MEDICARE

## 2023-04-12 DIAGNOSIS — Z4822 Encounter for aftercare following kidney transplant: Secondary | ICD-10-CM | POA: Diagnosis not present

## 2023-04-12 DIAGNOSIS — Z940 Kidney transplant status: Secondary | ICD-10-CM | POA: Diagnosis not present

## 2023-04-12 LAB — CBC W/ AUTO DIFF
BASOPHILS ABSOLUTE COUNT: 0 10*9/L (ref 0.0–0.1)
BASOPHILS RELATIVE PERCENT: 0.4 %
EOSINOPHILS ABSOLUTE COUNT: 0.1 10*9/L (ref 0.0–0.5)
EOSINOPHILS RELATIVE PERCENT: 1.1 %
HEMATOCRIT: 33.3 % — ABNORMAL LOW (ref 34.0–44.0)
HEMOGLOBIN: 11.2 g/dL — ABNORMAL LOW (ref 11.3–14.9)
LYMPHOCYTES ABSOLUTE COUNT: 0.9 10*9/L — ABNORMAL LOW (ref 1.1–3.6)
LYMPHOCYTES RELATIVE PERCENT: 15.1 %
MEAN CORPUSCULAR HEMOGLOBIN CONC: 33.8 g/dL (ref 32.0–36.0)
MEAN CORPUSCULAR HEMOGLOBIN: 31.6 pg (ref 25.9–32.4)
MEAN CORPUSCULAR VOLUME: 93.6 fL (ref 77.6–95.7)
MEAN PLATELET VOLUME: 8.5 fL (ref 6.8–10.7)
MONOCYTES ABSOLUTE COUNT: 0.4 10*9/L (ref 0.3–0.8)
MONOCYTES RELATIVE PERCENT: 6.5 %
NEUTROPHILS ABSOLUTE COUNT: 4.5 10*9/L (ref 1.8–7.8)
NEUTROPHILS RELATIVE PERCENT: 76.9 %
PLATELET COUNT: 105 10*9/L — ABNORMAL LOW (ref 150–450)
RED BLOOD CELL COUNT: 3.56 10*12/L — ABNORMAL LOW (ref 3.95–5.13)
RED CELL DISTRIBUTION WIDTH: 13.9 % (ref 12.2–15.2)
WBC ADJUSTED: 5.9 10*9/L (ref 3.6–11.2)

## 2023-04-12 LAB — RENAL FUNCTION PANEL
ALBUMIN: 3.9 g/dL (ref 3.4–5.0)
ANION GAP: 6 mmol/L (ref 5–14)
BLOOD UREA NITROGEN: 35 mg/dL — ABNORMAL HIGH (ref 9–23)
BUN / CREAT RATIO: 26
CALCIUM: 9.4 mg/dL (ref 8.7–10.4)
CHLORIDE: 113 mmol/L — ABNORMAL HIGH (ref 98–107)
CO2: 23.9 mmol/L (ref 20.0–31.0)
CREATININE: 1.35 mg/dL — ABNORMAL HIGH
EGFR CKD-EPI (2021) FEMALE: 42 mL/min/{1.73_m2} — ABNORMAL LOW (ref >=60)
GLUCOSE RANDOM: 100 mg/dL (ref 70–179)
PHOSPHORUS: 3.7 mg/dL (ref 2.4–5.1)
POTASSIUM: 4.6 mmol/L (ref 3.4–4.8)
SODIUM: 143 mmol/L (ref 135–145)

## 2023-04-12 MED ADMIN — belatacept (NULOJIX) 287.5 mg in sodium chloride (NS) 0.9 % 100 mL IVPB: 5 mg/kg | INTRAVENOUS | @ 16:00:00 | Stop: 2023-04-12

## 2023-04-12 NOTE — Unmapped (Signed)
Pt to [facility] for infusion of Belatacept. Pt denies new complaints/ infections. VSS  labs drawn . RIGHT HAND NEXT TO PINKIE AFTER WARM GLOVE PIV BRAUN  1212 Belatacept 287.5 mg  1242 Belatacept complete   VSS- PIV removed per protocol. Discharged to home without issue.

## 2023-04-13 DIAGNOSIS — B9620 Unspecified Escherichia coli [E. coli] as the cause of diseases classified elsewhere: Secondary | ICD-10-CM | POA: Diagnosis not present

## 2023-04-13 DIAGNOSIS — E1122 Type 2 diabetes mellitus with diabetic chronic kidney disease: Secondary | ICD-10-CM | POA: Diagnosis not present

## 2023-04-13 DIAGNOSIS — T8613 Kidney transplant infection: Secondary | ICD-10-CM | POA: Diagnosis not present

## 2023-04-13 DIAGNOSIS — N189 Chronic kidney disease, unspecified: Secondary | ICD-10-CM | POA: Diagnosis not present

## 2023-04-13 DIAGNOSIS — I509 Heart failure, unspecified: Secondary | ICD-10-CM | POA: Diagnosis not present

## 2023-04-13 DIAGNOSIS — D631 Anemia in chronic kidney disease: Secondary | ICD-10-CM | POA: Diagnosis not present

## 2023-04-13 DIAGNOSIS — I110 Hypertensive heart disease with heart failure: Secondary | ICD-10-CM | POA: Diagnosis not present

## 2023-04-13 DIAGNOSIS — N12 Tubulo-interstitial nephritis, not specified as acute or chronic: Secondary | ICD-10-CM | POA: Diagnosis not present

## 2023-04-13 DIAGNOSIS — G9341 Metabolic encephalopathy: Secondary | ICD-10-CM | POA: Diagnosis not present

## 2023-04-19 ENCOUNTER — Institutional Professional Consult (permissible substitution): Admit: 2023-04-19 | Discharge: 2023-04-20 | Payer: MEDICARE

## 2023-04-19 NOTE — Unmapped (Signed)
Clinical Social Worker Progress Note    Name:Julie [name]  Date of Birth:06-21-1951  ZOX:[id number]    RE: NO SHOW      Noted that patient had appointment scheduled today.  As of the writing of this note, patient is a no show/no call.    At this time, this CSW will be unable to see patient due to inability to reach pt.  Left VM w/ request to call back    Patient will need to be rescheduled at this time.        Lowella Petties, LCSW, CCTSW  Transplant Case Manager  [facility]

## 2023-04-24 DIAGNOSIS — Z940 Kidney transplant status: Principal | ICD-10-CM

## 2023-04-25 DIAGNOSIS — Z940 Kidney transplant status: Secondary | ICD-10-CM | POA: Diagnosis not present

## 2023-04-25 DIAGNOSIS — E1121 Type 2 diabetes mellitus with diabetic nephropathy: Secondary | ICD-10-CM | POA: Diagnosis not present

## 2023-04-25 DIAGNOSIS — E113412 Type 2 diabetes mellitus with severe nonproliferative diabetic retinopathy with macular edema, left eye: Secondary | ICD-10-CM | POA: Diagnosis not present

## 2023-04-25 DIAGNOSIS — I10 Essential (primary) hypertension: Secondary | ICD-10-CM | POA: Diagnosis not present

## 2023-04-25 DIAGNOSIS — Z794 Long term (current) use of insulin: Secondary | ICD-10-CM | POA: Diagnosis not present

## 2023-04-26 ENCOUNTER — Ambulatory Visit: Admit: 2023-04-26 | Discharge: 2023-04-27 | Payer: MEDICARE

## 2023-04-26 DIAGNOSIS — Z940 Kidney transplant status: Secondary | ICD-10-CM | POA: Diagnosis not present

## 2023-04-26 DIAGNOSIS — Z4822 Encounter for aftercare following kidney transplant: Secondary | ICD-10-CM | POA: Diagnosis not present

## 2023-04-26 LAB — CBC W/ AUTO DIFF
BASOPHILS ABSOLUTE COUNT: 0 10*9/L (ref 0.0–0.1)
BASOPHILS RELATIVE PERCENT: 0.4 %
EOSINOPHILS ABSOLUTE COUNT: 0 10*9/L (ref 0.0–0.5)
EOSINOPHILS RELATIVE PERCENT: 0.6 %
HEMATOCRIT: 30.9 % — ABNORMAL LOW (ref 34.0–44.0)
HEMOGLOBIN: 10.6 g/dL — ABNORMAL LOW (ref 11.3–14.9)
LYMPHOCYTES ABSOLUTE COUNT: 0.5 10*9/L — ABNORMAL LOW (ref 1.1–3.6)
LYMPHOCYTES RELATIVE PERCENT: 9.8 %
MEAN CORPUSCULAR HEMOGLOBIN CONC: 34.3 g/dL (ref 32.0–36.0)
MEAN CORPUSCULAR HEMOGLOBIN: 32 pg (ref 25.9–32.4)
MEAN CORPUSCULAR VOLUME: 93.1 fL (ref 77.6–95.7)
MEAN PLATELET VOLUME: 8.6 fL (ref 6.8–10.7)
MONOCYTES ABSOLUTE COUNT: 0.2 10*9/L — ABNORMAL LOW (ref 0.3–0.8)
MONOCYTES RELATIVE PERCENT: 4.5 %
NEUTROPHILS ABSOLUTE COUNT: 4.7 10*9/L (ref 1.8–7.8)
NEUTROPHILS RELATIVE PERCENT: 84.7 %
PLATELET COUNT: 91 10*9/L — ABNORMAL LOW (ref 150–450)
RED BLOOD CELL COUNT: 3.32 10*12/L — ABNORMAL LOW (ref 3.95–5.13)
RED CELL DISTRIBUTION WIDTH: 14.3 % (ref 12.2–15.2)
WBC ADJUSTED: 5.5 10*9/L (ref 3.6–11.2)

## 2023-04-26 LAB — RENAL FUNCTION PANEL
ALBUMIN: 4.2 g/dL (ref 3.4–5.0)
ANION GAP: 8 mmol/L (ref 5–14)
BLOOD UREA NITROGEN: 48 mg/dL — ABNORMAL HIGH (ref 9–23)
BUN / CREAT RATIO: 41
CALCIUM: 9.7 mg/dL (ref 8.7–10.4)
CHLORIDE: 110 mmol/L — ABNORMAL HIGH (ref 98–107)
CO2: 21.7 mmol/L (ref 20.0–31.0)
CREATININE: 1.17 mg/dL — ABNORMAL HIGH
EGFR CKD-EPI (2021) FEMALE: 49 mL/min/{1.73_m2} — ABNORMAL LOW (ref >=60)
GLUCOSE RANDOM: 223 mg/dL — ABNORMAL HIGH (ref 70–179)
PHOSPHORUS: 4 mg/dL (ref 2.4–5.1)
POTASSIUM: 5 mmol/L — ABNORMAL HIGH (ref 3.4–4.8)
SODIUM: 140 mmol/L (ref 135–145)

## 2023-04-26 MED ADMIN — belatacept (NULOJIX) 300 mg in sodium chloride (NS) 0.9 % 100 mL IVPB: 5 mg/kg | INTRAVENOUS | @ 15:00:00 | Stop: 2023-04-26

## 2023-04-26 NOTE — Unmapped (Signed)
Pt to [facility] for infusion of Belatacept. Pt denies new complaints/ infections. VSS  labs drawn . Took 4 Braun catheters 2 in R hand blew. Mid arm blew and only one right wrist worked after warm compress.  Advised to please drink more water before coming.       1126  Belatacept 300 mg   1156 Belatacept complete   VSS- PIV removed per protocol. Discharged to home without issue.

## 2023-05-09 DIAGNOSIS — E113513 Type 2 diabetes mellitus with proliferative diabetic retinopathy with macular edema, bilateral: Secondary | ICD-10-CM | POA: Diagnosis not present

## 2023-05-09 DIAGNOSIS — H35373 Puckering of macula, bilateral: Secondary | ICD-10-CM | POA: Diagnosis not present

## 2023-05-09 DIAGNOSIS — Z961 Presence of intraocular lens: Secondary | ICD-10-CM | POA: Diagnosis not present

## 2023-05-09 DIAGNOSIS — Z0100 Encounter for examination of eyes and vision without abnormal findings: Secondary | ICD-10-CM | POA: Diagnosis not present

## 2023-05-10 ENCOUNTER — Ambulatory Visit: Admit: 2023-05-10 | Discharge: 2023-05-11 | Payer: MEDICARE

## 2023-05-10 DIAGNOSIS — Z940 Kidney transplant status: Secondary | ICD-10-CM | POA: Diagnosis not present

## 2023-05-10 DIAGNOSIS — Z09 Encounter for follow-up examination after completed treatment for conditions other than malignant neoplasm: Secondary | ICD-10-CM | POA: Diagnosis not present

## 2023-05-10 LAB — CBC W/ AUTO DIFF
BASOPHILS ABSOLUTE COUNT: 0 10*9/L (ref 0.0–0.1)
BASOPHILS RELATIVE PERCENT: 0.5 %
EOSINOPHILS ABSOLUTE COUNT: 0 10*9/L (ref 0.0–0.5)
EOSINOPHILS RELATIVE PERCENT: 0.5 %
HEMATOCRIT: 34 % (ref 34.0–44.0)
HEMOGLOBIN: 11.6 g/dL (ref 11.3–14.9)
LYMPHOCYTES ABSOLUTE COUNT: 0.5 10*9/L — ABNORMAL LOW (ref 1.1–3.6)
LYMPHOCYTES RELATIVE PERCENT: 9 %
MEAN CORPUSCULAR HEMOGLOBIN CONC: 34.1 g/dL (ref 32.0–36.0)
MEAN CORPUSCULAR HEMOGLOBIN: 32.1 pg (ref 25.9–32.4)
MEAN CORPUSCULAR VOLUME: 94 fL (ref 77.6–95.7)
MEAN PLATELET VOLUME: 8.4 fL (ref 6.8–10.7)
MONOCYTES ABSOLUTE COUNT: 0.2 10*9/L — ABNORMAL LOW (ref 0.3–0.8)
MONOCYTES RELATIVE PERCENT: 2.9 %
NEUTROPHILS ABSOLUTE COUNT: 5.3 10*9/L (ref 1.8–7.8)
NEUTROPHILS RELATIVE PERCENT: 87.1 %
PLATELET COUNT: 108 10*9/L — ABNORMAL LOW (ref 150–450)
RED BLOOD CELL COUNT: 3.62 10*12/L — ABNORMAL LOW (ref 3.95–5.13)
RED CELL DISTRIBUTION WIDTH: 14.5 % (ref 12.2–15.2)
WBC ADJUSTED: 6 10*9/L (ref 3.6–11.2)

## 2023-05-10 LAB — RENAL FUNCTION PANEL
ALBUMIN: 4 g/dL (ref 3.4–5.0)
ANION GAP: 8 mmol/L (ref 5–14)
BLOOD UREA NITROGEN: 43 mg/dL — ABNORMAL HIGH (ref 9–23)
BUN / CREAT RATIO: 34
CALCIUM: 9 mg/dL (ref 8.7–10.4)
CHLORIDE: 106 mmol/L (ref 98–107)
CO2: 22.8 mmol/L (ref 20.0–31.0)
CREATININE: 1.27 mg/dL — ABNORMAL HIGH
EGFR CKD-EPI (2021) FEMALE: 45 mL/min/{1.73_m2} — ABNORMAL LOW (ref >=60)
GLUCOSE RANDOM: 318 mg/dL — ABNORMAL HIGH (ref 70–179)
PHOSPHORUS: 3.9 mg/dL (ref 2.4–5.1)
POTASSIUM: 5 mmol/L — ABNORMAL HIGH (ref 3.4–4.8)
SODIUM: 137 mmol/L (ref 135–145)

## 2023-05-10 MED ADMIN — belatacept (NULOJIX) 300 mg in sodium chloride (NS) 0.9 % 100 mL IVPB: 5 mg/kg | INTRAVENOUS | @ 19:00:00 | Stop: 2023-05-10

## 2023-05-10 NOTE — Unmapped (Signed)
[facility] Bel[facility]pt infusion, S/P Kidney Transplant .  Patient denies any new concerns or questions, denies any changes to her medical history, medications and allergies reviewed by Clinical research associate. 24G PIV placed braun , patient tolerated well, labs obtained per standing orders, no premeds ordered. Call light at patient's side, pt instructed on how to use.     1445 Belatacept 300 mg in 100 ml started per provider order     1518 Belatacept complete.      Patient tolerated infusion with no ill effects, PIV flushed with normal saline per infusion protocol, PIV D/C'd, gauze and coban applied. Vitals stable and comparable to baseline. Patient discharged from [facility]fusion Center in stable condition with no acute distress. Next infusion appointment made for four weeks.

## 2023-05-17 ENCOUNTER — Ambulatory Visit: Admit: 2023-05-17 | Discharge: 2023-05-23 | Payer: MEDICARE

## 2023-05-17 ENCOUNTER — Institutional Professional Consult (permissible substitution): Admit: 2023-05-17 | Discharge: 2023-05-18 | Payer: MEDICARE

## 2023-05-17 ENCOUNTER — Ambulatory Visit: Admit: 2023-05-17 | Discharge: 2023-05-23 | Disposition: A | Discharge status: Home / Self Care | Payer: MEDICARE

## 2023-05-17 DIAGNOSIS — R5383 Other fatigue: Secondary | ICD-10-CM | POA: Diagnosis not present

## 2023-05-17 DIAGNOSIS — Z87440 Personal history of urinary (tract) infections: Secondary | ICD-10-CM | POA: Diagnosis not present

## 2023-05-17 DIAGNOSIS — B952 Enterococcus as the cause of diseases classified elsewhere: Secondary | ICD-10-CM | POA: Diagnosis not present

## 2023-05-17 DIAGNOSIS — J811 Chronic pulmonary edema: Secondary | ICD-10-CM | POA: Diagnosis not present

## 2023-05-17 DIAGNOSIS — Z79899 Other long term (current) drug therapy: Secondary | ICD-10-CM | POA: Diagnosis not present

## 2023-05-17 DIAGNOSIS — E119 Type 2 diabetes mellitus without complications: Secondary | ICD-10-CM | POA: Diagnosis not present

## 2023-05-17 DIAGNOSIS — E871 Hypo-osmolality and hyponatremia: Secondary | ICD-10-CM | POA: Diagnosis not present

## 2023-05-17 DIAGNOSIS — E785 Hyperlipidemia, unspecified: Secondary | ICD-10-CM | POA: Diagnosis not present

## 2023-05-17 DIAGNOSIS — N3000 Acute cystitis without hematuria: Secondary | ICD-10-CM | POA: Diagnosis not present

## 2023-05-17 DIAGNOSIS — N390 Urinary tract infection, site not specified: Secondary | ICD-10-CM | POA: Diagnosis not present

## 2023-05-17 DIAGNOSIS — T8619 Other complication of kidney transplant: Secondary | ICD-10-CM | POA: Diagnosis not present

## 2023-05-17 DIAGNOSIS — E039 Hypothyroidism, unspecified: Secondary | ICD-10-CM | POA: Diagnosis not present

## 2023-05-17 DIAGNOSIS — I5030 Unspecified diastolic (congestive) heart failure: Secondary | ICD-10-CM | POA: Diagnosis not present

## 2023-05-17 DIAGNOSIS — N12 Tubulo-interstitial nephritis, not specified as acute or chronic: Secondary | ICD-10-CM | POA: Diagnosis not present

## 2023-05-17 DIAGNOSIS — N179 Acute kidney failure, unspecified: Secondary | ICD-10-CM | POA: Diagnosis not present

## 2023-05-17 DIAGNOSIS — Z452 Encounter for adjustment and management of vascular access device: Secondary | ICD-10-CM | POA: Diagnosis not present

## 2023-05-17 DIAGNOSIS — N189 Chronic kidney disease, unspecified: Secondary | ICD-10-CM | POA: Diagnosis not present

## 2023-05-17 DIAGNOSIS — R918 Other nonspecific abnormal finding of lung field: Secondary | ICD-10-CM | POA: Diagnosis not present

## 2023-05-17 DIAGNOSIS — R509 Fever, unspecified: Secondary | ICD-10-CM | POA: Diagnosis not present

## 2023-05-17 DIAGNOSIS — N202 Calculus of kidney with calculus of ureter: Secondary | ICD-10-CM | POA: Diagnosis not present

## 2023-05-17 DIAGNOSIS — I10 Essential (primary) hypertension: Secondary | ICD-10-CM | POA: Diagnosis not present

## 2023-05-17 DIAGNOSIS — E782 Mixed hyperlipidemia: Secondary | ICD-10-CM | POA: Diagnosis not present

## 2023-05-17 DIAGNOSIS — Z940 Kidney transplant status: Secondary | ICD-10-CM | POA: Diagnosis not present

## 2023-05-17 DIAGNOSIS — Z1635 Resistance to multiple antimicrobial drugs: Secondary | ICD-10-CM | POA: Diagnosis not present

## 2023-05-17 DIAGNOSIS — B9620 Unspecified Escherichia coli [E. coli] as the cause of diseases classified elsewhere: Secondary | ICD-10-CM | POA: Diagnosis not present

## 2023-05-17 DIAGNOSIS — A4151 Sepsis due to Escherichia coli [E. coli]: Secondary | ICD-10-CM | POA: Diagnosis not present

## 2023-05-17 DIAGNOSIS — D84821 Immunodeficiency due to drugs: Secondary | ICD-10-CM | POA: Diagnosis not present

## 2023-05-17 DIAGNOSIS — A419 Sepsis, unspecified organism: Secondary | ICD-10-CM | POA: Diagnosis not present

## 2023-05-17 DIAGNOSIS — E1122 Type 2 diabetes mellitus with diabetic chronic kidney disease: Secondary | ICD-10-CM | POA: Diagnosis not present

## 2023-05-17 DIAGNOSIS — D693 Immune thrombocytopenic purpura: Secondary | ICD-10-CM | POA: Diagnosis not present

## 2023-05-17 DIAGNOSIS — J810 Acute pulmonary edema: Secondary | ICD-10-CM | POA: Diagnosis not present

## 2023-05-17 LAB — CBC W/ AUTO DIFF
BASOPHILS ABSOLUTE COUNT: 0 10*9/L (ref 0.0–0.1)
BASOPHILS RELATIVE PERCENT: 0.1 %
EOSINOPHILS ABSOLUTE COUNT: 0 10*9/L (ref 0.0–0.5)
EOSINOPHILS RELATIVE PERCENT: 0.1 %
HEMATOCRIT: 35 % (ref 34.0–44.0)
HEMOGLOBIN: 12.2 g/dL (ref 11.3–14.9)
LYMPHOCYTES ABSOLUTE COUNT: 0.8 10*9/L — ABNORMAL LOW (ref 1.1–3.6)
LYMPHOCYTES RELATIVE PERCENT: 6.2 %
MEAN CORPUSCULAR HEMOGLOBIN CONC: 34.8 g/dL (ref 32.0–36.0)
MEAN CORPUSCULAR HEMOGLOBIN: 32.4 pg (ref 25.9–32.4)
MEAN CORPUSCULAR VOLUME: 93 fL (ref 77.6–95.7)
MEAN PLATELET VOLUME: 8.2 fL (ref 6.8–10.7)
MONOCYTES ABSOLUTE COUNT: 0.8 10*9/L (ref 0.3–0.8)
MONOCYTES RELATIVE PERCENT: 6 %
NEUTROPHILS ABSOLUTE COUNT: 11.2 10*9/L — ABNORMAL HIGH (ref 1.8–7.8)
NEUTROPHILS RELATIVE PERCENT: 87.6 %
PLATELET COUNT: 100 10*9/L — ABNORMAL LOW (ref 150–450)
RED BLOOD CELL COUNT: 3.76 10*12/L — ABNORMAL LOW (ref 3.95–5.13)
RED CELL DISTRIBUTION WIDTH: 14.2 % (ref 12.2–15.2)
WBC ADJUSTED: 12.8 10*9/L — ABNORMAL HIGH (ref 3.6–11.2)

## 2023-05-17 LAB — URINALYSIS WITH MICROSCOPY
BILIRUBIN UA: NEGATIVE
GLUCOSE UA: NEGATIVE
KETONES UA: NEGATIVE
NITRITE UA: NEGATIVE
PH UA: 5.5 (ref 5.0–9.0)
PROTEIN UA: 100 — AB
RBC UA: 8 /HPF — ABNORMAL HIGH (ref <=4)
SPECIFIC GRAVITY UA: 1.014 (ref 1.003–1.030)
SQUAMOUS EPITHELIAL: 1 /HPF (ref 0–5)
TRANSITIONAL EPITHELIAL: <1 /HPF (ref 0–2)
UROBILINOGEN UA: <2
WBC UA: >182 /HPF — ABNORMAL HIGH (ref 0–5)

## 2023-05-17 LAB — COMPREHENSIVE METABOLIC PANEL
ALBUMIN: 4.3 g/dL (ref 3.4–5.0)
ALKALINE PHOSPHATASE: 66 U/L (ref 46–116)
ANION GAP: 6 mmol/L (ref 5–14)
BILIRUBIN TOTAL: 1.1 mg/dL (ref 0.3–1.2)
BLOOD UREA NITROGEN: 35 mg/dL — ABNORMAL HIGH (ref 9–23)
BUN / CREAT RATIO: 24
CALCIUM: 9.3 mg/dL (ref 8.7–10.4)
CHLORIDE: 104 mmol/L (ref 98–107)
CO2: 23 mmol/L (ref 20.0–31.0)
CREATININE: 1.44 mg/dL — ABNORMAL HIGH
EGFR CKD-EPI (2021) FEMALE: 38 mL/min/{1.73_m2} — ABNORMAL LOW (ref >=60)
GLUCOSE RANDOM: 150 mg/dL (ref 70–179)
PROTEIN TOTAL: 7.5 g/dL (ref 5.7–8.2)
SODIUM: 133 mmol/L — ABNORMAL LOW (ref 135–145)

## 2023-05-17 LAB — LACTATE SEPSIS, VENOUS: LACTATE BLOOD VENOUS: 1.8 mmol/L (ref 0.5–1.8)

## 2023-05-17 MED ADMIN — cefepime (MAXIPIME) FOR IV PUSH: 2 g | INTRAVENOUS | @ 20:00:00 | Stop: 2023-05-17

## 2023-05-17 MED ADMIN — acetaminophen (TYLENOL) tablet 1,000 mg: 1000 mg | ORAL | @ 19:00:00 | Stop: 2023-05-17

## 2023-05-17 MED ADMIN — vancomycin (VANCOCIN) IVPB 1000 mg (premix): 1000 mg | INTRAVENOUS | @ 20:00:00 | Stop: 2023-05-17

## 2023-05-17 NOTE — Unmapped (Signed)
Pt here with fever and chills. TMAX up to 102.18F. Pt is kidney transplant pt from January.

## 2023-05-17 NOTE — Unmapped (Signed)
Patient's spouse reporting tmax 101.3 yesterday and 100.3 today after tylenol.  Pt denies any urinary symptoms but reports lethagy and feeling bad.  Pt advised to go to [facility] ED to be evaluated.

## 2023-05-17 NOTE — Unmapped (Signed)
KIDNEY POST-TRANSPLANT ASSESSMENT   Clinical Social Worker Telephone Note    Name:Julie [name]  Date of Birth:[birth year]  RUE:[id number]    REFERRAL INFORMATION:    Julie [name] is s/p transplant for kidney transplantation . CSW follows up to assess general check-in.    PREFERRED LANGUAGE: English/Korean    INTERPRETER UTILIZED: N/A    TRANSPLANT DATE:   08/06/2022 (Kidney)    POST TXP RN COORDINATOR:   Celene Squibb [phone]; fax/([phone]    SUMMARY:  Called pt at appt time today for general check in.  Pt reports that she feels unwell and asked this CSW to talk to her spouse/Julie [name] instead.    Spouse reports that they both received their flu and Covid vaccines on 10/21 (Monday).  He began to feel a little symptomatic on Tues pm/Wed AM, but took a Tylenol and was fine afterwards.  Pt had no complaints but began to run a fever Wed that progressed from 99 to 102.7 and was 101.3 this AM.  Spouse has been administering Tylenol.  Spouse has noticed fatigue and nasal drip, but no other symptoms of coughing, nausea/vomiting, pain, muscle aches, AMS.      Relayed above info to TNC/Coleen.  Spouse states that he has not called her yet to inform her of new symptoms.  TNC will reach out to spouse.      Will f/up for general monitoring q 4 weeks.      Lowella Petties, LCSW, CCTSW  Transplant Case Manager  [facility] for Transplant Care  05/17/2023

## 2023-05-17 NOTE — Unmapped (Addendum)
[facility]  Emergency Department Provider Note     ED Clinical Impression     Final diagnoses:   Fever, unspecified fever cause (Primary)      Impression, Medical Decision Making, ED Course     Impression: 72 y.o. female who has a past medical history of type 2 diabetes, ESRD s/p renal transplant, hypothyroidism, anemia who presents with fever as described below.     Initial vitals here with blood pressure 177/44, temperature 99.2 ??F, heart rate 75 and saturating well on room air.  Upon initial evaluation, patient is resting comfortably and in no acute distress.  Lungs are clear to auscultation bilaterally.  Abdomen soft, nontender nondistended.  No CVA tenderness bilaterally.    DDx/MDM: Initial differential includes but is not limited to UTI, viral illness, pneumonia, amongst multiple etiologies.  Given that her immunocompromise state, will get full sepsis workup including blood cultures, lactate, labs, UA, EKG and chest x-ray.  Will get a respiratory pathogen panel.    Given the patient has reassuring vitals here, we will hold off on fluids and broad-spectrum antibiotics until further workup.    ED Course as of 05/17/23 1455   Thu May 17, 2023   1443 EKG independently viewed myself and shows normal sinus rhythm with rate of 79 bpm.  QTc of 433.  Does have T wave inversion in V5 and V6   1452 Patient be signed out to incoming team pending workup.       MDM Elements    Independent Interpretation of Studies: See ED course  I have reviewed recent and relevant previous record, including: Outpatient notes - kidney transplant case management from today for fever advised to come to the ED          ____________________________________________    The case was discussed with the attending physician, who is in agreement with the above assessment and plan.      History     Chief Complaint  Chief Complaint   Patient presents with    Fever       HPI   Genisys Mackall is a 72 y.o. female with past medical history as below who presents with fever.  Patient reports that she developed a fever yesterday with a high of 102 ??F.  They have been taking Tylenol every 4 hours.  Patient has also been having some generalized fatigue and nasal congestion.  Denies any cough, sore throat, nausea, vomiting, chest pain, abdominal pain, dysuria, hematuria, diarrhea.  Does have a history of a kidney transplant in January.  She has been taking her antirejection meds as prescribed.  Denies any known sick contacts.  Did get her COVID and flu shot on 10/21.    Outside Historian(s): I have obtained additional history/collateral from husband.    Past Medical History:   Diagnosis Date    Acute metabolic acidosis 01/29/2023    Acute metabolic encephalopathy 01/29/2023    AKI (acute kidney injury) (CMS-HCC) 01/29/2023    Anemia     Anemia due to stage 5 chronic kidney disease, not on chronic dialysis (CMS-HCC) 08/08/2022    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)     since July 2017    Hypertension     Hypothyroidism (acquired)     Kidney transplant status, cadaveric 08/08/2022    Type 2 diabetes mellitus (CMS-HCC)        Past Surgical History:   Procedure Laterality Date    CATARACT EXTRACTION      HYSTERECTOMY  OOPHORECTOMY      PERITONEAL CATHETER INSERTION      PR LAP INSERTION TUNNELED INTRAPERITONEAL CATHETER N/A 09/27/2018    Procedure: LAPAROSCOPY, SURGICAL; WITH INSERTION OF INTRAPERITONEAL CANNULA OR CATHETER, PERMANENT;  Surgeon: Leona Carry, MD;  Location: MAIN OR [place];  Service: Transplant    PR LAP INSERTION TUNNELED INTRAPERITONEAL CATHETER N/A 04/08/2019    Procedure: LAPAROSCOPY, SURGICAL; WITH INSERTION OF INTRAPERITONEAL CANNULA OR CATHETER, PERMANENT;  Surgeon: Leona Carry, MD;  Location: MAIN OR [facility];  Service: Transplant    PR LAP REVISE INTRAPERITONEAL CATHETER N/A 08/26/2019    Procedure: LAPAROSCOPY, SURGICAL; W/REVIS PREV PLACED INTRAPERITONEAL CANNULA/CATH, REMOV INTRALUMIN OBSTRUCT MATERIAL;  Surgeon: Leona Carry, MD;  Location: MAIN OR [facility];  Service: Transplant    PR REDUCE VOLVULUS,INTUSS,INTERN HERNIA N/A 10/13/2022    Procedure: REDUCTION OF VOLVULUS, INTUSSUSCEPTION, INTERNAL HERNIA, BY LAPAROTOMY;  Surgeon: Suella Broad, MD;  Location: MAIN OR [place];  Service: Trauma    PR REMOVAL TUNNELED INTRAPERITONEAL CATHETER N/A 05/06/2018    Procedure: REMOVAL OF PERMANENT INTRAPERITONEAL CANNULA OR CATHETER;  Surgeon: Leona Carry, MD;  Location: MAIN OR [facility];  Service: Transplant    PR REMOVE PERITONEAL FOREIGN BODY N/A 04/08/2019    Procedure: Removal Of Peritoneal Of Foreign Body From Peritoneal Cavity;  Surgeon: Leona Carry, MD;  Location: MAIN OR [facility];  Service: Transplant    PR TRANSPLANT,PREP RENAL GRAFT/ARTERIAL Right 08/06/2022    Procedure: [facility] RECONSTRUCTION CADAVER/LIVING DONOR RENAL ALLOGRAFT PRIOR TO TRANSPLANT; ARTERIAL ANASTOMOSIS EAC;  Surgeon: Toledo, Lilyan Punt, MD;  Location: MAIN OR [facility];  Service: Transplant    PR TRANSPLANTATION OF KIDNEY Right 08/06/2022    Procedure: RENAL ALLOTRANSPLANTATION, IMPLANTATION OF GRAFT; WITHOUT RECIPIENT NEPHRECTOMY;  Surgeon: Toledo, Lilyan Punt, MD;  Location: MAIN OR [facility];  Service: Transplant    TUBAL LIGATION         No current facility-administered medications for this encounter.    Current Outpatient Medications:     alendronate (FOSAMAX) 70 MG tablet, Take 1 tablet (70 mg total) by mouth every seven (7) days., Disp: , Rfl:     amlodipine (NORVASC) 5 MG tablet, Take 1 tablet (5 mg total) by mouth every twelve (12) hours., Disp: 90 tablet, Rfl: 3    atorvastatin (LIPITOR) 10 MG tablet, Take 1 tablet (10 mg total) by mouth daily., Disp: 90 tablet, Rfl: 3    BAQSIMI 3 mg/actuation Spry, , Disp: , Rfl:     blood-glucose sensor (DEXCOM G7 SENSOR) Devi, Use 1 sensor every 10 days., Disp: 3 each, Rfl: 11    carvedilol (COREG) 25 MG tablet, Take 1 tablet (25 mg total) by mouth two (2) times a day., Disp: 180 tablet, Rfl: 3    chlorthalidone (HYGROTON) 25 MG tablet, Take 2 tablets (50 mg total) by mouth daily., Disp: 180 tablet, Rfl: 3    cholecalciferol, vitamin D3 25 mcg, 1,000 units,, (CHOLECALCIFEROL-25 MCG, 1,000 UNIT,) 1,000 unit (25 mcg) tablet, Take 2 tablets (50 mcg total) by mouth daily., Disp: , Rfl:     cranberry extract/vitamin C (AZO CRANBERRY PLUS VIT C ORAL), Take 2 tablets by mouth in the morning., Disp: , Rfl:     cyanocobalamin, vitamin B-12, 1000 MCG tablet, Take 1 tablet (1,000 mcg total) by mouth daily., Disp: , Rfl:     D-MANNOSE ORAL, Take 2,000 mg by mouth in the morning., Disp: , Rfl:     estradiol (ESTRACE) 0.01 % (0.1 mg/gram) vaginal cream, Insert 2 g into the vagina nightly.,  Disp: 60 g, Rfl: 11    fosfomycin (MONUROL) 3 gram Pack, Take 3 g by mouth once a week for 12 doses., Disp: 36 g, Rfl: 0    hydrALAZINE (APRESOLINE) 100 MG tablet, Take 1 tablet (100 mg total) by mouth Three (3) times a day., Disp: 270 tablet, Rfl: 3    insulin aspart (NOVOLOG) 100 unit/mL vial, Inject 0.03 mL (3 Units total) under the skin., Disp: , Rfl:     insulin glargine (BASAGLAR, LANTUS) 100 unit/mL (3 mL) injection pen, Inject 0.05 mL (5 Units total) under the skin nightly., Disp: , Rfl:     lancets Misc, Use to check blood sugar as directed with insulin 3 times a day & for symptoms of high or low blood sugar., Disp: 100 each, Rfl: 0    levothyroxine (SYNTHROID) 112 MCG tablet, Take 1 tablet (112 mcg total) by mouth daily., Disp: 90 tablet, Rfl: 3    magnesium oxide (MAG-OX) 400 mg (241.3 mg elemental magnesium) tablet, Take 1 tablet (400 mg total) by mouth two (2) times a day., Disp: 60 tablet, Rfl: 11    melatonin 3 mg Tab, Take 2 tablets (6 mg total) by mouth nightly. Taking 3 mg every night, Disp: , Rfl:     mycophenolate (CELLCEPT) 250 mg capsule, Take 1 capsule (250 mg total) by mouth two (2) times a day., Disp: 180 capsule, Rfl: 3    pantoprazole (PROTONIX) 40 MG tablet, Take 1 tablet (40 mg total) by mouth daily as needed., Disp: , Rfl:     predniSONE (DELTASONE) 5 MG tablet, Take 1 tablet (5 mg total) by mouth in the morning., Disp: 90 tablet, Rfl: 3    sodium bicarbonate 650 mg tablet, Take 1 tablet (650 mg total) by mouth Three (3) times a day., Disp: 270 tablet, Rfl: 3    spironolactone (ALDACTONE) 25 MG tablet, Take 1 tablet (25 mg total) by mouth daily., Disp: 90 tablet, Rfl: 3    tacrolimus (PROGRAF) 0.5 MG capsule, Take 3 capsules (1.5 mg total) by mouth daily AND 2 capsules (1 mg total) nightly., Disp: 450 capsule, Rfl: 3    Allergies  Gabapentin and Valacyclovir    Family History  Family History   Problem Relation Age of Onset    Diabetes Mother     Alzheimer's disease Father     Diabetes Sister     Diabetes Sister     Diabetes Sister     Anesthesia problems Neg Hx     Bleeding Disorder Neg Hx        Social History  Social History     Tobacco Use    Smoking status: Former     Current packs/day: 0.00     Average packs/day: 0.8 packs/day for 30.0 years (22.5 ttl pk-yrs)     Types: Cigarettes     Start date: 06/06/1976     Quit date: 06/06/2006     Years since quitting: 16.9    Smokeless tobacco: Former   Substance Use Topics    Alcohol use: Yes     Comment: only special occasions-a glass of wine sometimes    Drug use: No        Physical Exam     VITAL SIGNS:      Vitals:    05/17/23 1308 05/17/23 1319 05/17/23 1320   BP:   177/44   Pulse: 78  75   Resp:   18   Temp:   37.3 ??C (99.2 ??F)  TempSrc:   Oral   SpO2: 99%  100%   Weight:  58.8 kg (129 lb 10.1 oz)        Constitutional: Alert and oriented. No acute distress.  Eyes: Conjunctivae are normal.  HEENT: Normocephalic and atraumatic. Conjunctivae clear. No congestion. Moist mucous membranes.   Cardiovascular: Rate as above, regular rhythm. Normal and symmetric distal pulses. Brisk capillary refill. Normal skin turgor.  Respiratory: Normal respiratory effort. Breath sounds are normal. There are no wheezing or crackles heard.  Gastrointestinal: Soft, non-distended, non-tender.  Genitourinary: Deferred.  Musculoskeletal: Non-tender with normal range of motion in all extremities.  Neurologic: Normal speech and language. No gross focal neurologic deficits are appreciated. Patient is moving all extremities equally, face is symmetric at rest and with speech.  Skin: Skin is warm, dry and intact. No rash noted.  Psychiatric: Mood and affect are normal. Speech and behavior are normal.     Radiology     XR Chest 2 views    (Results Pending)       Pertinent labs & imaging results that were available during my care of the patient were independently interpreted by me and considered in my medical decision making (see chart for details).    Portions of this record have been created using Scientist, clinical (histocompatibility and immunogenetics). Dictation errors have been sought, but may not have been identified and corrected.           Francis Dowse, MD  Resident  05/17/23 [phone]

## 2023-05-18 LAB — COMPREHENSIVE METABOLIC PANEL
ALBUMIN: 3.4 g/dL (ref 3.4–5.0)
ALKALINE PHOSPHATASE: 73 U/L (ref 46–116)
ALT (SGPT): 18 U/L (ref 10–49)
ANION GAP: 15 mmol/L — ABNORMAL HIGH (ref 5–14)
AST (SGOT): 14 U/L (ref <=34)
BILIRUBIN TOTAL: 0.7 mg/dL (ref 0.3–1.2)
BLOOD UREA NITROGEN: 49 mg/dL — ABNORMAL HIGH (ref 9–23)
BUN / CREAT RATIO: 21
CALCIUM: 8.5 mg/dL — ABNORMAL LOW (ref 8.7–10.4)
CHLORIDE: 101 mmol/L (ref 98–107)
CO2: 20 mmol/L (ref 20.0–31.0)
CREATININE: 2.32 mg/dL — ABNORMAL HIGH
EGFR CKD-EPI (2021) FEMALE: 22 mL/min/{1.73_m2} — ABNORMAL LOW (ref >=60)
GLUCOSE RANDOM: 186 mg/dL — ABNORMAL HIGH (ref 70–179)
POTASSIUM: 3.8 mmol/L (ref 3.4–4.8)
PROTEIN TOTAL: 6.7 g/dL (ref 5.7–8.2)
SODIUM: 136 mmol/L (ref 135–145)

## 2023-05-18 LAB — CBC W/ AUTO DIFF
BASOPHILS ABSOLUTE COUNT: 0 10*9/L (ref 0.0–0.1)
BASOPHILS RELATIVE PERCENT: 0.1 %
EOSINOPHILS ABSOLUTE COUNT: 0 10*9/L (ref 0.0–0.5)
EOSINOPHILS RELATIVE PERCENT: 0 %
HEMATOCRIT: 30.4 % — ABNORMAL LOW (ref 34.0–44.0)
HEMOGLOBIN: 10.5 g/dL — ABNORMAL LOW (ref 11.3–14.9)
LYMPHOCYTES ABSOLUTE COUNT: 0.4 10*9/L — ABNORMAL LOW (ref 1.1–3.6)
LYMPHOCYTES RELATIVE PERCENT: 3 %
MEAN CORPUSCULAR HEMOGLOBIN CONC: 34.5 g/dL (ref 32.0–36.0)
MEAN CORPUSCULAR HEMOGLOBIN: 32.4 pg (ref 25.9–32.4)
MEAN CORPUSCULAR VOLUME: 94.1 fL (ref 77.6–95.7)
MEAN PLATELET VOLUME: 8.7 fL (ref 6.8–10.7)
MONOCYTES ABSOLUTE COUNT: 0.7 10*9/L (ref 0.3–0.8)
MONOCYTES RELATIVE PERCENT: 5 %
NEUTROPHILS ABSOLUTE COUNT: 12.6 10*9/L — ABNORMAL HIGH (ref 1.8–7.8)
NEUTROPHILS RELATIVE PERCENT: 91.9 %
PLATELET COUNT: 83 10*9/L — ABNORMAL LOW (ref 150–450)
RED BLOOD CELL COUNT: 3.24 10*12/L — ABNORMAL LOW (ref 3.95–5.13)
RED CELL DISTRIBUTION WIDTH: 14.2 % (ref 12.2–15.2)
WBC ADJUSTED: 13.8 10*9/L — ABNORMAL HIGH (ref 3.6–11.2)

## 2023-05-18 LAB — CMV DNA, QUANTITATIVE, PCR: CMV VIRAL LD: NOT DETECTED

## 2023-05-18 LAB — PROTEIN / CREATININE RATIO, URINE
CREATININE, URINE: 101.6 mg/dL
PROTEIN URINE: 104.3 mg/dL
PROTEIN/CREAT RATIO, URINE: 1.027

## 2023-05-18 LAB — TACROLIMUS LEVEL, TROUGH: TACROLIMUS, TROUGH: <1 ng/mL — ABNORMAL LOW (ref 5.0–15.0)

## 2023-05-18 LAB — PRO-BNP: PRO-BNP: 8639 pg/mL — ABNORMAL HIGH (ref <=300.0)

## 2023-05-18 MED ADMIN — insulin lispro (HumaLOG) injection 0-20 Units: 0-20 [IU] | SUBCUTANEOUS | @ 13:00:00

## 2023-05-18 MED ADMIN — hydrALAZINE (APRESOLINE) tablet 100 mg: 100 mg | ORAL | @ 20:00:00

## 2023-05-18 MED ADMIN — lactated ringers bolus 1,000 mL: 1000 mL | INTRAVENOUS | @ 13:00:00 | Stop: 2023-05-18

## 2023-05-18 MED ADMIN — sodium bicarbonate tablet 650 mg: 650 mg | ORAL | @ 13:00:00

## 2023-05-18 MED ADMIN — pantoprazole (Protonix) EC tablet 40 mg: 40 mg | ORAL | @ 13:00:00

## 2023-05-18 MED ADMIN — insulin lispro (HumaLOG) injection 0-20 Units: 0-20 [IU] | SUBCUTANEOUS | @ 16:00:00

## 2023-05-18 MED ADMIN — carvedilol (COREG) tablet 25 mg: 25 mg | ORAL | @ 13:00:00

## 2023-05-18 MED ADMIN — predniSONE (DELTASONE) tablet 5 mg: 5 mg | ORAL | @ 04:00:00

## 2023-05-18 MED ADMIN — levothyroxine (SYNTHROID) tablet 112 mcg: 112 ug | ORAL | @ 10:00:00

## 2023-05-18 MED ADMIN — acetaminophen (TYLENOL) tablet 1,000 mg: 1000 mg | ORAL | @ 20:00:00

## 2023-05-18 MED ADMIN — sodium bicarbonate tablet 650 mg: 650 mg | ORAL | @ 04:00:00

## 2023-05-18 MED ADMIN — hydrALAZINE (APRESOLINE) tablet 100 mg: 100 mg | ORAL | @ 13:00:00

## 2023-05-18 MED ADMIN — sodium bicarbonate tablet 650 mg: 650 mg | ORAL | @ 20:00:00

## 2023-05-18 MED ADMIN — melatonin tablet 6 mg: 6 mg | ORAL | @ 04:00:00

## 2023-05-18 MED ADMIN — heparin (porcine) 5,000 unit/mL injection 5,000 Units: 5000 [IU] | SUBCUTANEOUS | @ 20:00:00

## 2023-05-18 MED ADMIN — ertapenem (INVANZ) 500 mg in sodium chloride (NS) 0.9 % 50 mL IVPB: 500 mg | INTRAVENOUS | @ 04:00:00 | Stop: 2023-05-24

## 2023-05-18 MED ADMIN — insulin lispro (HumaLOG) injection 0-20 Units: 0-20 [IU] | SUBCUTANEOUS | @ 20:00:00

## 2023-05-18 MED ADMIN — acetaminophen (OFIRMEV) 10 mg/mL injection 1,000 mg: 1000 mg | INTRAVENOUS | @ 01:00:00 | Stop: 2023-05-18

## 2023-05-18 MED ADMIN — heparin (porcine) 5,000 unit/mL injection 5,000 Units: 5000 [IU] | SUBCUTANEOUS | @ 10:00:00

## 2023-05-18 MED ADMIN — atorvastatin (LIPITOR) tablet 10 mg: 10 mg | ORAL | @ 04:00:00

## 2023-05-18 MED ADMIN — cholecalciferol (vitamin D3 25 mcg (1,000 units)) tablet 50 mcg: 50 ug | ORAL | @ 13:00:00

## 2023-05-18 MED ADMIN — ondansetron (ZOFRAN) injection 4 mg: 4 mg | INTRAVENOUS | @ 01:00:00 | Stop: 2023-05-17

## 2023-05-18 MED ADMIN — amlodipine (NORVASC) tablet 5 mg: 5 mg | ORAL | @ 13:00:00

## 2023-05-18 MED ADMIN — insulin lispro (HumaLOG) injection 0-20 Units: 0-20 [IU] | SUBCUTANEOUS | @ 04:00:00

## 2023-05-18 NOTE — Unmapped (Signed)
Internal Medicine (MEDU) Progress Note    Assessment & Plan:   Julie [name] is a 72 y.o. female whose presentation is complicated by ESRD s/p DDKT 07/2022, recurrent MDR UTIs, HTN, T2DM, SBO/ileus, hyperlipidemia, hypothroidism and chronic idiopathic thrombocytopenia that presented to [facility] with fever, fatigue, and chills with UA findings concerning for UTI.  Also with AKI.    Principal Problem:    UTI (urinary tract infection)  Active Problems:    Type 2 diabetes mellitus with diabetic nephropathy, with long-term current use of insulin (CMS-HCC)    Hypothyroidism, acquired    Kidney transplant recipient    Immunosuppressed status (CMS-HCC)    Primary hypertension    Mixed hyperlipidemia    Chronic idiopathic thrombocytopenia (CMS-HCC)    AKI (acute kidney injury) (CMS-HCC)    Hyponatremia    Active Problems     UTI - Hx Recurrent MDR UTIs and Bacteremia  Patient presented with 3 day history of fever, chills, and 3 episodes of NBNB emesis in ED. ED work-up notable for WBC 12.8, Plt 100, BUN 35, Cr 1.44. UA with large LE, >182 WBC, 8 RBC, moderate bacteria.  Patient has had multiple MDR E. coli UTIs in the past, which which were susceptible to ertapenem.    - Immunocompromised ID consulted, appreciate recs  - Ertapenem 1 g daily (10/25-)  - Contact isolation  - Per ID, will consider methenamine  - CMV, EBV  - F/u urine culture  - F/u blood cultures   - Tylenol PRN fever    #Perinephric fluid collection 10/05/22, slowly improving 01/27/23  [place] identified perinephric collection 10/05/22 (8.1 x 0.8 x 2.3 cm). 3/19 IR guided drainage negative cx, Cr 1.5 not suggestive of urinoma. 4/13 increased stranding on CT, non-amenable to VIR drainage. 01/27/23 CT a/p 7.4 x 1.0 x 5.4 cm crescentic fluid collection within the soft tissues lateral to the transplant kidney - VIR unable to aspirate. 04/05/23 CT Interval resolution of perinephric fluid collection without new perinephric collection identified.  -CT abdomen pelvis without contrast    AKI  Creatinine on admission was 1.44, from a baseline of around 1.1.  Acute decline in kidney function since hospitalization is likely prerenal secondary to infection and decreased intake.  Attempted oral rehydration protocol, however creatinine increased to 2.32 later in the day on 10/25.  Renal transplant ultrasound 10/25 unremarkable, with no visualized fluid collection or hydronephrosis.  UP/CR around 1 on 05/17/23, up from 0.09 on 03/09/23.  - s/p 1 L IV fluids 10/25.  - Transplant nephrology following; appreciate recs  - Repeat DSA levels  - Strict Is/Os  - Renally dose all meds for eGFR <15     Renal transplant on immunosuppression  Patient received renal transplant in January 2024, on chronic immunosuppression. Receives maintenance Belatacept 5 mg/kg every 2 weeks (last dose 10/17).  - Nephrology following  - Home Prednisone 5 mg daily  - HOLD home mycophenolate -- while awaiting blood cultures   - Home sodium bicarb 650 TID     Pulmonary edema  Concern for heart failure  Systolic murmur  Most recent TTE in March 2024 with normal ejection fraction.  Patient presented without complaint of shortness of breath, however chest x-ray showing possible mild pulmonary edema.  No overt signs of volume overload on physical exam, however, did have an 1 L oxygen requirement on 10/25 after a liter of fluids given for AKI.  proBNP on admission 8639. Has a 3/6 holosystolic murmur, more notable at the apex.  This murmur has been documented as far back as 2020; patient states she has had it for years.  -TTE  -Monitor volume status    Hypertension (resolved)  Patient has chronic hypertension and was noted to be hypertensive on admission up to 170s systolic.  She did not take any of her AM home BP meds on the day of admission.  Home blood pressure meds resumed, blood pressure improved.  - Continue home spironolactone, amlodipine, hydralazine, carvedilol -- low threshold to hold medications if becomes hypotensive  - Hold home chlorthalidone iso AKI   - Continue to monitor blood pressures     Chronic Problems     T2DM: Patient has history of type 2 DM, on home lantus 5 units, lispro  - Lantus 5u nightly   - SSI     Hyperlipidemia  - Continued home atorvastatin     Chronic idiopathic thrombocytopenia: Noted low platelets on CBC on admission this appears to be a chronic problem documented at least until 09/2018. Platelets near baseline.  - Continue to monitor with routine CBCs  - Reassess if platelets in critical range (<20000)     Hypothyroidism: Patient has chronic hypothyroidism on levothyroxine  - Continue home levothyroxine     Osteoporosis: Noted to have osteoporosis in chart.   - Continue home cholecalciferol  - Hold home alondronate      Daily Checklist:  Diet: Regular Diet  DVT PPx: Heparin 5000units q8h  Electrolytes: Replete Potassium to >/= 3.6 and Magnesium to >/= 1.8  Code Status: Full Code  Dispo:  Inpatient    Team Contact Information:   Primary Team: Internal Medicine (MEDU)  Primary Resident: Percival Spanish, MD, MD  Resident's Pager: [phone] (Gen MedW Intern - Alvester Morin)    Interval History:   No acute events overnight.  Patient and husband at bedside restate history as outlined in the HPI.  Additionally, endorse 1 episode of vomiting and some mild diarrhea upon admission.    All other systems were reviewed and are negative except as noted in the HPI    Objective:   Temp:  [36.9 ??C (98.4 ??F)-39.3 ??C (102.7 ??F)] 39.3 ??C (102.7 ??F)  Heart Rate:  [65-90] 90  SpO2 Pulse:  [64-75] 69  Resp:  [12-26] 21  BP: (121-171)/(36-53) 161/44  SpO2:  [92 %-100 %] 92 %    Gen: NAD, converses   HENT: atraumatic, normocephalic  Heart: 3/6 holosystolic murmur present throughout all fields, loudest at apex.  Normal rate, regular rhythm.  Lungs: CTAB, no crackles or wheezes  Abdomen: soft, NTND, no CVA tenderness  Extremities: Trace edema

## 2023-05-18 NOTE — Unmapped (Addendum)
Julie [name] MRN [id number]. 10F w/ hx DDKT, recurrent MDR UTI, h/w fever/chills/fatigue/UA suggestive of UTI. Sharlynn Oliphant (Med U) CB [phone]

## 2023-05-18 NOTE — Unmapped (Signed)
IMMUNOCOMPROMISED HOST INFECTIOUS DISEASE CONSULT NOTE    Julie [name] is being seen in consultation at the request of Jacqualin Combes, MD for evaluation of fever in setting of prior recurrent MDR UTIs.    Assessment/Recommendations:    Julie [name] is a 72 y.o. 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 Had perinephric drain in place postoperatively, removed 08/30/22  - Immunosuppression: Belatacept 5 mg/kg every 2 weeks (last dose 10/17), MMF 250 mg BID, & prednisone 5 mg daily   - Estimated Creatinine Clearance: 17.9 mL/min (A) (based on SCr of 2.32 mg/dL (H)).    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 Coli - s/p Levo x 3 weeks (thru stent removal, 3/15) (S: Cefazolin, Cipro/Levo, Pip-tazo, Cephalexin; R: TMP-SMX, Amp, Amp-sulb)  - 10/05/22 BCx/UCx ESBL/FQ-R E. Coli (S: Erta, Gent, Mero, Tobra, Cefepime SDD; I: ceftaz; R: Cipro/Levo, Cefazolin, Ceftriaxone, Amp) s/p 2w Meropenem; 10/10/22 IR guided drainage negative cx, Cr 1.5 not suggestive of urinoma   - 11/04/22 Bcx/Ucx E. Coli (R CFZ, CTX, cipro/levo, I ceftaz, SDD CFP, susc nitro) s/p 2w Meropenem  - 11/28/22 seen by urogyn, no structural issues, added vaginal estrogen/d-mannose  - 12/25/22 UCx <10,000 E. Coli (S: Cefepime SDD; Erta/Mero, Gent, Nitrofurantoin, Tobra) s/p ertapenem until 01/15/23  - nitrofurantoin prophylaxis (4/29-6/2) not restarted due to reduced GFR and breakthrough. Fosfomycin cost-prohibitive  - 01/27/23 - UCx <1,000 CFU E. Coli, BCx (NG), CTAP: 7.4 x 1.0 x 5.4 cm crescentic fluid collection within the soft tissues lateral to the transplant kidney (VIR unable to aspirate) treated with ertapenem until CT resolution of collection on 04/05/23 imaging    Active infection:  # Fever possibly due to E coli UTI 05/18/23  - completed ertapenem mid-Sept after resolution of fluid collection on 04/05/23 imaging  - 10/21 Flu/COVID vaccine  - Febrile 10/22 PM with chills vomiting, myalgias, altered mentation. No urgency or dysuria prodrome.  - 10/24 UA: >182 WBC, 6 RBC, bacteria. Ucx: discussed with micro that growth on plate consistent with E coli; prior growth of MDR organisms as above.  - 10/24 Bcx pending    Rx 10/24 Vanc/Cefepime -> 10/25 Ertapenem       Antimicrobial allergy/intolerance:   Penicillin (see  allergy list for nuances)  Valacyclovir - nausea       RECOMMENDATIONS    Diagnosis  Obtain CT Abdomen Pelvis to assess for recurrent of perinephric fluid given prior - given Creatinine reasonable to pursue non-contrasted at this time although contrasted study would provide higher yield.  Follow up Urine Culture, Blood culture (if blood culture positive may need line removal)    Management  Continue Ertapenem 1g daily or renal equivalent. Duration pending urine susceptibilities and CT findings.    Prophylaxis required for host deficiency: transplant immunosuppression  Continue twice weekly vaginal estrogen for UTI prevention    Intensive toxicity monitoring for prescription antimicrobials   CBC w/diff at least once per week  CMP at least once per week  clinical assessments for rashes or other skin changes    The [facility] ID service will continue to follow.           Please page the ID Transplant/Liquid Oncology Fellow consult at [phone] with questions.  Patient discussed with Dr. Reynold Bowen.    Waldron Labs, MD PhD  [facility] Division of Infectious Diseases    History of Present Illness:      External record(s): Primary team note: Summarized in HPI below .    Independent historian(s): was required due to patient status (mild confusion, required corroboration with notes). Primary care team provider reports vomiting on admission .       History obtained from patient were corroborated by chart review due to patient being difficult historian in setting of AMS    She reports being in her usual state of health until 10/21 when she received both the flu and COVID-vaccine in her right shoulder.  She states that she had some mild muscle pain at the site of infection that resolved by the next day, but she developed a fever on 10/22.  She denies other symptoms (headaches, vision changes, rhinorrhea, cough, shortness of breath, chest pain, abdominal pain, dysuria, diarrhea) at that time.  Fevers were controlled with Tylenol until she had recurrent fevers on Wednesday -at that time her spouse noticed some nasal drip. She denies ever having had worsening urinary urgency or dysuria, although she has not presented with these symptoms during prior episodes of urosepsis.    Upon arrival patient was febrile to 39.1 but HDS.  Spouse had also reported per chart review that she had loose stools and vomiting while in the emergency room.  Noted to have leukocytosis of 13.8, RPP negative, chest x-ray clear.  Started empirically on Vanco/cefepime after blood cultures drawn -urinalysis showed gross pyuria with protein and blood.  Urine culture sent.     Per discussion with patient she had been prior prescribed estrogen gel for her vagina in setting of dry vaginal mucosa.  She reports only taking this intermittently with no recent use, and per review of dispense report last fill 8/27.    Allergies:  Allergies   Allergen Reactions    Gabapentin Other (See Comments)     Shakes    Valacyclovir Nausea Only and Other (See Comments)       Medications:   Antimicrobials:  Anti-infectives (From admission, onward)      Start     Dose/Rate Route Frequency Ordered Stop    05/17/23 2224  ertapenem (INVANZ) 500 mg in sodium chloride (NS) 0.9 % 50 mL IVPB         500 mg  124 mL/hr over 30 Minutes Intravenous Every 24 hours scheduled 05/17/23 2213 05/24/23 2059  Current/Prior immunomodulators per problem list. No change since admission.    Other medications reviewed.     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    Past Surgical History:   Procedure Laterality Date    CATARACT EXTRACTION      HYSTERECTOMY      OOPHORECTOMY      PERITONEAL CATHETER INSERTION      PR LAP INSERTION TUNNELED INTRAPERITONEAL CATHETER N/A 09/27/2018    Procedure: LAPAROSCOPY, SURGICAL; WITH INSERTION OF INTRAPERITONEAL CANNULA OR CATHETER, PERMANENT; Surgeon: Leona Carry, MD;  Location: MAIN OR [place];  Service: Transplant    PR LAP INSERTION TUNNELED INTRAPERITONEAL CATHETER N/A 04/08/2019    Procedure: LAPAROSCOPY, SURGICAL; WITH INSERTION OF INTRAPERITONEAL CANNULA OR CATHETER, PERMANENT;  Surgeon: Leona Carry, MD;  Location: MAIN OR [facility];  Service: Transplant    PR LAP REVISE INTRAPERITONEAL CATHETER N/A 08/26/2019    Procedure: LAPAROSCOPY, SURGICAL; W/REVIS PREV PLACED INTRAPERITONEAL CANNULA/CATH, REMOV INTRALUMIN OBSTRUCT MATERIAL;  Surgeon: Leona Carry, MD;  Location: MAIN OR [facility];  Service: Transplant    PR REDUCE VOLVULUS,INTUSS,INTERN HERNIA N/A 10/13/2022    Procedure: REDUCTION OF VOLVULUS, INTUSSUSCEPTION, INTERNAL HERNIA, BY LAPAROTOMY;  Surgeon: Suella Broad, MD;  Location: MAIN OR [place];  Service: Trauma    PR REMOVAL TUNNELED INTRAPERITONEAL CATHETER N/A 05/06/2018    Procedure: REMOVAL OF PERMANENT INTRAPERITONEAL CANNULA OR CATHETER;  Surgeon: Leona Carry, MD;  Location: MAIN OR [facility];  Service: Transplant    PR REMOVE PERITONEAL FOREIGN BODY N/A 04/08/2019    Procedure: Removal Of Peritoneal Of Foreign Body From Peritoneal Cavity;  Surgeon: Leona Carry, MD;  Location: MAIN OR [facility];  Service: Transplant    PR TRANSPLANT,PREP RENAL GRAFT/ARTERIAL Right 08/06/2022    Procedure: [facility] RECONSTRUCTION CADAVER/LIVING DONOR RENAL ALLOGRAFT PRIOR TO TRANSPLANT; ARTERIAL ANASTOMOSIS EAC;  Surgeon: Toledo, Lilyan Punt, MD;  Location: MAIN OR [facility];  Service: Transplant    PR TRANSPLANTATION OF KIDNEY Right 08/06/2022    Procedure: RENAL ALLOTRANSPLANTATION, IMPLANTATION OF GRAFT; WITHOUT RECIPIENT NEPHRECTOMY;  Surgeon: Toledo, Lilyan Punt, MD;  Location: MAIN OR [facility];  Service: Transplant    TUBAL LIGATION       Denies prior surgeries with retained hardware.    Social History:  Tobacco use:   reports that she quit smoking about 16 years ago. Her smoking use included cigarettes. She started smoking about 46 years ago. She has a 22.5 pack-year smoking history. She has quit using smokeless tobacco.   Alcohol use:    reports current alcohol use.   Drug use:    reports no history of drug use.   Living situation:  Lives with spouse/partner   Residence:   [place]   Birth place  [place]    [place] travel:   No [place] travel outside of [address]   International travel:   No travel outside of the [employer] service:  Has  not served in the [employer]   Employment:     Pets and animal exposure:     Insect exposure:     Hobbies:     TB exposures:     Sexual history:     Other significant exposures:       Family History:  Family History   Problem Relation Age of Onset    Diabetes Mother     Alzheimer's disease Father     Diabetes Sister     Diabetes Sister     Diabetes Sister     Anesthesia problems Neg Hx     Bleeding Disorder Neg Hx             Vital Signs last 24 hours:  Temp:  [36.9 ??C (98.5 ??F)-39.1 ??C (102.3 ??F)] 36.9 ??C (98.5 ??F)  Heart Rate:  [65-84] 67  SpO2 Pulse:  [64-78] 64  Resp:  [13-26] 13  BP: (121-181)/(36-82) 143/41  MAP (mmHg):  [60-105] 72  SpO2:  [95 %-100 %] 99 %    Physical Exam:  Patient Lines/Drains/Airways Status       Active Active Lines, Drains, & Airways       Name Placement date Placement time Site Days    CVC Single Lumen 02/06/23 Tunneled Left Internal jugular 02/06/23  1006  Internal jugular  100    CVC Triple Lumen 01/28/23 Non-tunneled Left Internal jugular 01/28/23  0016  Internal jugular  110    Peripheral IV 05/17/23 Right Wrist 05/17/23  1536  Wrist  less than 1    Peripheral IV 05/17/23 Right Hand 05/17/23  1537  Hand  less than 1    Arteriovenous Fistula - Vein Graft  Access 08/24/16 Left;Upper Arm 08/24/16  --  Arm  2458                  Const [x]  vital signs above    [x]  NAD, non-toxic appearance []  Chronically ill-appearing, non-distressed        Eyes [x]  Lids normal bilaterally, conjunctiva anicteric and noninjected OU     [] PERRL [] EOMI        ENMT [x]  Normal appearance of external nose and ears, no nasal discharge        [x]  MMM, no lesions on lips or gums []  No thrush, leukoplakia, oral lesions  [x]  Dentition good []  Edentulous []  Dental caries present  []  Hearing normal  []  TMs with good light reflexes bilaterally         Neck [x]  Neck of normal appearance and trachea midline        []  No thyromegaly, nodules, or tenderness   []  Full neck ROM        Lymph [x]  No LAD in neck     []  No LAD in supraclavicular area     []  No LAD in axillae   []  No LAD in epitrochlear chains     []  No LAD in inguinal areas        CV [x]  RRR            []  No peripheral edema     []  Pedal pulses intact   []  No abnormal heart sounds appreciated   []  Extremities WWP         Resp [x]  Normal WOB at rest    []  No breathlessness with speaking, no coughing  [x]  CTA anteriorly    []  CTA posteriorly          GI [x]  Normal inspection, NTND   []   NABS     []  No umbilical hernia on exam       []  No hepatosplenomegaly     []  Inspection of perineal and perianal areas normal        GU []  Normal external genitalia     [] No urinary catheter present in urethra   []  No CVA tenderness    []  No tenderness over renal allograft  No vaginal atrophy appreciated      MSK [x]  No clubbing or cyanosis of hands       []  No vertebral point tenderness  []  No focal tenderness or abnormalities on palpation of joints in RUE, LUE, RLE, or LLE        Skin [x]  No rashes, lesions, or ulcers of visualized skin     []  Skin warm and dry to palpation         Neuro [x]  Face expression symmetric  [x]  Sensation to light touch grossly intact throughout    [x]  Moves extremities equally    []  No tremor noted        []  CNs II-XII grossly intact     []  DTRs normal and symmetric throughout []  Gait unremarkable        Psych [x]  Appropriate affect       []  Fluent speech         [x]  Attentive, good eye contact  [x]  Oriented to person, place, time          []  Judgment and insight are appropriate           Data for Medical Decision Making     (10/25) EKG QTcF 414    I discussed mgm't w/qualified health care professional(s) involved in case: primary team .    I reviewed CBC results (13.8 wbc) and chemistry results (Cr 2.32).    I independently visualized/interpreted cxs/plates in lab (Extensive colony growth, morphology consistent with E coli.).       Recent Labs     Units 05/18/23  0708   WBC 10*9/L 13.8*   HGB g/dL 74.2*   PLT 59*5/G 83*   NEUTROABS 10*9/L 12.6*   LYMPHSABS 10*9/L 0.4*   EOSABS 10*9/L 0.0   NA mmol/L 136   K mmol/L 3.8   BUN mg/dL 49*   CREATININE mg/dL 3.87*   GLU mg/dL 564*   CALCIUM mg/dL 8.5*   BILITOT mg/dL 0.7   AST U/L 14   ALT U/L 18       Lab Results   Component Value Date    Tacrolimus, Trough 10.2 03/29/2023    Total IgG 647 (L) 01/28/2023       Microbiology:  Microbiology Results (last day)       Procedure Component Value Date/Time Date/Time    Urine Culture [[id number]] Collected: 05/17/23 1957    Lab Status: In process Specimen: Urine from Clean Catch Updated: 05/17/23 2029    Respiratory Pathogen Panel [[id number]]  (Normal) Collected: 05/17/23 1541    Lab Status: Final result Specimen: Nasopharyngeal Swab Updated: 05/17/23 1720     Adenovirus Not Detected     Coronavirus HKU1 Not Detected     Coronavirus NL63 Not Detected     Coronavirus 229E Not Detected     Coronavirus OC43 PCR Not Detected     Metapneumovirus Not Detected     Rhinovirus/Enterovirus Not Detected     Influenza A Not Detected     Influenza B Not Detected     Parainfluenza 1 Not Detected  Parainfluenza 2 Not Detected     Parainfluenza 3 Not Detected     Parainfluenza 4 Not Detected     RSV Not Detected     Bordetella pertussis Not Detected     Comment: If B. pertussis/parapertussis infection is suspected, the Bordetella pertussis/parapertussis Qualitative PCR test should be ordered.        Bordetella parapertussis Not Detected     Chlamydophila (Chlamydia) pneumoniae Not Detected     Mycoplasma pneumoniae Not Detected SARS-CoV-2 PCR Not Detected    Narrative:      This result was obtained using the FDA-cleared [employer] Respiratory 2.1 Panel. Performance characteristics have been established and verified by the Clinical Molecular Microbiology Laboratory, [facility]. This assay does not distinguish between rhinovirus and enterovirus. Lower respiratory specimens will not be tested for Bordetella pertussis/parapertussis. For nasopharyngeal swabs, cross-reactivity may occur between B. pertussis and non-pertussis Bordetella species. All positive B. pertussis results will be automatically confirmed using our in-house PCR assay.    Blood Culture [[id number]] Collected: 05/17/23 1532    Lab Status: In process Specimen: Blood from 1 Peripheral Draw Updated: 05/17/23 1659    Blood Culture [[id number]] Collected: 05/17/23 1511    Lab Status: In process Specimen: Blood from 1 Peripheral Draw Updated: 05/17/23 1538            Imaging:  US Renal Transplant W Doppler    Result Date: 05/18/2023  EXAM: US RENAL TRANSPLANT [place] ACCESSION: [id number] UN     CLINICAL INDICATION: 72 years old with AKI, c/f UTI; eval fluid collection     COMPARISON: 01/28/23     TECHNIQUE:  Ultrasound views of the renal transplant were obtained using gray scale and color and spectral Doppler imaging. Views of the urinary bladder were obtained using gray scale and limited color Doppler imaging.     FINDINGS:     TRANSPLANTED KIDNEY: The renal transplant was located in the left lower quadrant. Normal size and echogenicity.  No solid masses or calculi. Simple left renal cyst measuring 1.1 x 1.2 x 0.9 cm. No perinephric collections identified. No hydronephrosis.     VESSELS: - Perfusion: Using power Doppler, normal perfusion was seen throughout the renal parenchyma. - Resistive indices in the renal transplant are slightly decreased compared with prior examination.     Please see below for data measurements: Transplant location: LLQ     Renal Transplant: Sagittal 12.2 cm; AP 5.1 cm; Transverse 6.4 cm     Segmental artery superior resistive index: 0.77 Segmental artery mid resistive index: 0.80 Segmental artery inferior resistive index: 0.75     Previous resistive indices range of segmental arteries: 0.85-0.86     Main renal artery peak systolic velocity at anastomosis: 115.45 cm/s Main renal artery hilum resistive index: 0.81 Main renal artery mid resistive index: 0.82 Main renal artery anastomosis resistive index: 0.83     Previous resistive indices range of main renal artery: 0.75-0.90     Main renal vein: Patent     Iliac artery: Patent Iliac vein: Patent     Bladder volume prevoid: 46.8  mL     - Main renal artery/iliac artery: Patent - Main renal vein/iliac vein: Patent     BLADDER: Decompressed, limiting evaluation.         1. Patent transplant vasculature. Improved resistive indices in the renal transplant arteries, at the upper limits of normal. 2. No visualized fluid collection is noted. 3. Please note, ultrasound is not sensitive for the evaluation of pyelonephritis.  XR Chest 2 views    Result Date: 05/17/2023  EXAM: XR CHEST 2 VIEWS DATE: 05/17/2023 3:56 PM ACCESSION: [id number] UN DICTATED: 05/17/2023 4:20 PM INTERPRETATION LOCATION: MAIN CAMPUS     CLINICAL INDICATION: 72 years old Female with FEVER      TECHNIQUE: Single frontal view of the chest.     COMPARISON: 01/28/2023.     FINDINGS:     Lung: Evaluation of the lung apices is limited by superimposition of the patient's chin. The visualized lungs appear clear. Prominence of the vasculature and interstitium.     Pleura: No pleural effusion or pneumothorax identified.     Mediastinum: The cardiomediastinal silhouette appears similar to the prior exam. There are calcifications of the aorta.     Bones: Degenerative changes of the spine. Left-sided vascular stent.         Prominence of the vasculature and interstitium which may be related to low lung volume; however, clinical correlation is recommended to mild pulmonary edema. No focal consolidation identified.         ECG 12 Lead    Result Date: 05/17/2023  BASELINE ARTIFACT NORMAL SINUS RHYTHM ST-T WAVE ABNORMALITIES , CONSIDER ISCHEMIA , VENTRICULAR ENLARGEMENT ABNORMAL ECG WHEN COMPARED WITH ECG OF 27-Jan-2023 20:44, NO SIGNIFICANT CHANGE WAS FOUND Confirmed by Huel Coventry [phone]) on 05/17/2023 4:03:08 PM     Serologies:  Lab Results   Component Value Date    CMV IGG Positive (A) 08/06/2022    EBV VCA IgG Antibody Positive (A) 08/06/2022    HIV Antigen/Antibody Combo Nonreactive 08/06/2022    Hep A IgG Reactive (A) 06/06/2022    Hep B Surface Ag Nonreactive 12/28/2022    Hep B S Ab Reactive (A) 08/06/2022    Hep B Surf Ab Quant 265.82 (H) 08/06/2022    Hep B Core Total Ab Reactive (A) 08/06/2022    Hepatitis C Ab Nonreactive 08/06/2022    RPR Nonreactive 08/06/2022    HSV 1 IgG Positive (A) 08/06/2022    HSV 2 IgG Negative 08/06/2022    Varicella IgG Positive 08/06/2022    Rubella IgG Scr Positive 06/06/2022    Toxoplasma Gondii IgG Negative 06/06/2022    Quantiferon TB Gold Plus Interpretation Negative 06/06/2022    Quantiferon Mitogen Minus Nil 9.92 06/06/2022    Quantiferon Antigen 1 minus Nil 0.17 06/06/2022       Immunizations:  Immunization History   Administered Date(s) Administered    COVID-19 VAC,BIVALENT(68YR UP),PFIZER 09/04/2019, 09/25/2019    COVID-19 VAC,MRNA,TRIS(12Y UP)(PFIZER)(GRAY CAP) 09/04/2019, 09/25/2019, 04/29/2020, 11/27/2020    COVID-19 VACC,MRNA,(PFIZER)(PF) 09/04/2019, 09/25/2019, 04/29/2020    Covid-19 Vacc, Unspecified 09/04/2019, 09/04/2019, 09/25/2019, 09/25/2019    HEPATITIS B VACCINE ADULT,IM(ENERGIX B, RECOMBIVAX) 04/27/2017    Hepatitis B Vaccine, Unspecified Formulation 04/27/2017    INFLUENZA INJ MDCK PF, QUAD,(FLUCELVAX)(64MO AND UP EGG FREE) 06/29/2017    INFLUENZA QUAD HIGH DOSE 23YRS+(FLUZONE) 04/05/2016, 04/27/2017, 05/31/2018, 04/10/2019    Influenza Virus Vaccine, unspecified formulation 04/24/2015, 05/26/2015, 04/05/2016, 04/05/2016, 04/27/2017, 04/27/2017, 05/31/2018, 05/31/2018, 04/10/2019, 04/10/2019, 05/09/2019, 04/29/2020, 05/09/2021, 05/09/2021, 04/24/2022, 04/26/2022    PNEUMOCOCCAL POLYSACCHARIDE 23-VALENT 07/09/2015    PPD Test 12/19/2015, 10/24/2016, 11/26/2017, 03/08/2019, 01/26/2020, 12/22/2020, 01/04/2022    Pneumococcal Conjugate 13-Valent 07/31/2018, 08/02/2018    Pneumococcal vaccine, Unspecified Formulation 06/28/2016, 08/26/2021    Pneumococcal, Unspecified Formulation 08/26/2021    SHINGRIX-ZOSTER VACCINE (HZV),RECOMBINANT,ADJUVANTED(IM) 01/18/2023    TdaP 09/25/2016

## 2023-05-18 NOTE — Unmapped (Signed)
Transplant Nephrology Consult     Requesting Attending Physician :  Jacqualin Combes, MD  Service Requesting Consult : [facility] Counter ([facility])  Reason for Consult: immunosuppression management    Assessment and Plan:    # S/p Kidney Transplant 08/06/2022, Kidney allograft function:  # AKI  - Graft function: creatinine previous 0.9-1.  - DSAs: not present as of 09/06/22  - Early post-txp course notable for multiple readmissions for UTI, ileus/SBO due to volvulus, and for resistant hypertension  - etiology of AKI likely prerenal in the setting of infection, decreased PO intake and GI losses    # Immunosuppression:  - Belatacept 5mg /kg q2 weeks (last dose 10/17), Mycophenolate 250 mg bid, prednisone 5 mg daily    # Blood Pressure / Volume:  - Goal BP <130/80  - current meds: amlodipine 5 mg bid, carvedilol 25 mg bid, chlorthalidone 50 mg daily, hydralazine 100 mg daily, spironolactone 25 mg daily    # Infectious Prophylaxis and Monitoring:  # Fever   CMV D+/R+, EBV D+/R+  - h/o Frequent UTIs (ESBL E coli) driving 5 post-transplant admissions. 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 Follows with [facility] outpatient.   - previously on IV Ertapenem that ended around 9/13 w/ tunneled CVC removed on 9/16              - prior CT scan on 9/12 showed resolution of fluid collection              - ID to have started on ppx fosfomycin              - was supposed to urology on 08/08 but cancelled by patient   - previously on nitrofurantoin for ppx, methenamine cost-prohibitive  - patient also received flu & COVID vaccine this past Monday  - UA on admission concerning for ongoing infection including large LE, small blood, >182 WBC, 8 RBC, and moderate bacteria  - blood & urine culutres collected. Respiratory panel negative  - CXR showing no areas of consolidation  - pending CT abdomen and pelvis  - Ertepenem per [facility]    RECOMMENDATIONS:   - Recommend to hold mycophenolate until clinically more stable  - Appreciate ICID recommendations  - Follow up cultures  - Hold spironolactone and chlorthalidone  - Transplant patients with an open wound require wound care with sterile water only. The patient should be counseled on this at the time of discharge if they have not already been doing this.  - We will continue to follow.     Worthy Keeler, DO  05/18/2023 3:58 PM     Medical decision-making for 05/18/23  Findings / Data     Patient has: []  acute illness w/systemic sxs  [mod]  []  two or more stable chronic illnesses [mod]  []  one chronic illness with acute exacerbation [mod]  []  acute complicated illness  [mod]  []  Undiagnosed new problem with uncertain prognosis  [mod] [x]  illness posing risk to life or bodily function (ex. AKI)  [high]  []  chronic illness with severe exacerbation/progression  [high]  []  chronic illness with severe side effects of treatment  [high] AKI Probs At least 2:  Probs, Data, Risk   I reviewed: [x]  primary team note  []  consultant note(s)  []  external records [x]  chemistry results  [x]  CBC results  []  blood gas results  []  Other []  procedure/op note(s)   []  radiology report(s)  []  micro result(s)  []  w/ independent historian(s) Elevated Scr >=  3 Data Review (2 of 3)    I independently interpreted: []  Urine Sediment  [x]  Renal US [x]  CXR Images  []  CT Images  []  Other []  EKG Tracing CXR mild pulmonary edema Any     I discussed: []  Pathology results w/ QHPs(s) from other specialties  []  Procedural findings w/ QHPs(s) from other specialties []  Imaging w/ QHP(s) from other specialties  [x]  Treatment plan w/ QHP(s) from other specialties Plan discussed with primary team Any     Mgm't requires: []  Prescription drug(s)  [mod]  []  Kidney biopsy  [mod]  []  Central line placement  [mod] [x]  High risk medication use and/or intensive toxicity monitoring [high]  []  Renal replacement therapy [high]  []  High risk kidney biopsy  [high]  []  Escalation of care  [high]  []  High risk central line placement [high] Immunosuppression: high risk for infection Risk      _______________________________________________________    Transplant Background  Date of Transplant: 08/06/2022 (Kidney)  Type of Transplant: DCD  KDPI: 56%  Cold ischemic time: 877 minutes (14 hr 37 min)  Warm ischemic time: 32 minutes  cPRA: 0%  HLA match:   Blood type: Donor A1, Recipient A POS  ID: CMV D+/R+, EBV D+/R+  Native Kidney Disease: Diabetes              Native kidney biopsy: Diabetic nephropathy (done elsewhere)              Pre-transplant dialysis course: PD started July 2017, transitioned to HD October 2019 due to catheter dysfunction/inadequate dialysis  Prior Transplants: None  Induction: Basiliximab  Early steroid withdrawal: No     Biopsies:   None     History of Present Illness:  Julie [name] is a/an 72 y.o. 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 Nephrology has been consulted for DDKT.      History is obtained from husband over the phone. According to him, both he and Julie [name] received their flu + COVID vaccine this past Monday. However, patient started to have low-grade fevers around 99.4 F 2 days later. Yesterday, patient started to have recurrent fevers that improved with Tylenol up to 102.7 F. Associated symptoms during this time include: vomiting & loose stool (husband reports this started when at the ED). No URI symptoms, cough, LAD, abdominal pain, GU, rashes, arthralgia, or meningeal signs. Patient does have a LUE AVF but no concerning features for infection. Patient has not traveled recently but did recently return to church this past Sunday for the first time since her transplant. Did not report any sick exposures. Appetite has been good as have BP with average being between 140-150s/40-50s per husband. No NSAID use.    Medications:  Current Facility-Administered Medications:     acetaminophen (TYLENOL) tablet 1,000 mg, Oral, Q8H PRN    [Provider Hold] alendronate (FOSAMAX) tablet 70 mg, Oral, Q7 Days    amlodipine (NORVASC) tablet 5 mg, Oral, Q12H    atorvastatin (LIPITOR) tablet 10 mg, Oral, Daily    carvedilol (COREG) tablet 25 mg, Oral, BID    cholecalciferol (vitamin D3 25 mcg (1,000 units)) tablet 50 mcg, Oral, Daily    dextrose (D10W) 10% bolus 125 mL, Intravenous, Q10 Min PRN    ertapenem (INVANZ) 500 mg in sodium chloride (NS) 0.9 % 50 mL IVPB, Intravenous,  Q24H SCH    glucagon injection 1 mg, Intramuscular, Once PRN    glucose chewable tablet 16 g, Oral, Q10 Min PRN    heparin (porcine) 5,000 unit/mL injection 5,000 Units, Subcutaneous, Q8H SCH    hydrALAZINE (APRESOLINE) tablet 100 mg, Oral, TID    insulin glargine (LANTUS) injection 5 Units, Subcutaneous, Nightly    insulin lispro (HumaLOG) injection 0-20 Units, Subcutaneous, ACHS    levothyroxine (SYNTHROID) tablet 112 mcg, Oral, daily    melatonin tablet 6 mg, Oral, Nightly    [Provider Hold] mycophenolate (CELLCEPT) capsule 250 mg, Oral, BID    ondansetron (ZOFRAN) injection 4 mg, Intravenous, Q8H PRN    pantoprazole (Protonix) EC tablet 40 mg, Oral, Daily    predniSONE (DELTASONE) tablet 5 mg, Oral, Daily    sodium bicarbonate tablet 650 mg, Oral, TID    [Provider Hold] spironolactone (ALDACTONE) tablet 25 mg, Oral, Daily    Prior to Admission medications    Medication Dose, Route, Frequency   amlodipine (NORVASC) 5 MG tablet 5 mg, Oral, Every 12 hours   atorvastatin (LIPITOR) 10 MG tablet 10 mg, Oral, Daily (standard)   carvedilol (COREG) 25 MG tablet 25 mg, Oral, 2 times a day (standard)   chlorthalidone (HYGROTON) 25 MG tablet 50 mg, Oral, Daily (standard)   estradiol (ESTRACE) 0.01 % (0.1 mg/gram) vaginal cream 2 g, Vaginal, Nightly   fosfomycin (MONUROL) 3 gram Pack 3 g, Oral, Weekly   hydrALAZINE (APRESOLINE) 100 MG tablet 100 mg, Oral, 3 times a day (standard)   insulin glargine (BASAGLAR, LANTUS) 100 unit/mL (3 mL) injection pen 5 Units, Subcutaneous, Nightly   levothyroxine (SYNTHROID) 112 MCG tablet 112 mcg, Oral, Daily (standard)   magnesium oxide (MAG-OX) 400 mg (241.3 mg elemental magnesium) tablet 400 mg, Oral, 2 times a day (standard)   melatonin 3 mg Tab 2 tablets, Oral, Nightly, Taking 3 mg every night   mycophenolate (CELLCEPT) 250 mg capsule 250 mg, Oral, 2 times a day (standard)   pantoprazole (PROTONIX) 40 MG tablet 40 mg, Oral, Daily PRN   predniSONE (DELTASONE) 5 MG tablet 5 mg, Oral, Daily   sodium bicarbonate 650 mg tablet 650 mg, Oral, 3 times a day (standard)   spironolactone (ALDACTONE) 25 MG tablet 25 mg, Oral, Daily (standard)   tacrolimus (PROGRAF) 0.5 MG capsule Take 3 capsules (1.5 mg total) by mouth daily AND 2 capsules (1 mg total) nightly.   alendronate (FOSAMAX) 70 MG tablet 70 mg, Oral, Every 7 days   BAQSIMI 3 mg/actuation Spry    blood-glucose sensor (DEXCOM G7 SENSOR) Devi Use 1 sensor every 10 days.   cholecalciferol, vitamin D3 25 mcg, 1,000 units,, (CHOLECALCIFEROL-25 MCG, 1,000 UNIT,) 1,000 unit (25 mcg) tablet 50 mcg, Oral, Daily (standard)   cranberry extract/vitamin C (AZO CRANBERRY PLUS VIT C ORAL) 2 tablets, Oral, Daily   cranberry fruit extract (CRANBERRY CONCENTRATE ORAL) 1 capsule, Oral, Daily   cyanocobalamin, vitamin B-12, 1000 MCG tablet 1,000 mcg, Oral, Daily (standard)   D-MANNOSE ORAL 2,000 mg, Oral, Daily   insulin aspart (NOVOLOG) 100 unit/mL vial 3-5 Units, Subcutaneous, 3 times a day (AC)   lancets Misc Use to check blood sugar as directed with insulin 3 times a day & for symptoms of high or low blood sugar.   melatonin 5 mg tablet 5 mg, Oral, At bedtime   nitrofurantoin, macrocrystal-monohydrate, (MACROBID) capsule 100 mg, Oral, Once      Allergies:  Gabapentin and Valacyclovir    Medical History:  Past Medical History:  Diagnosis Date    Acute metabolic acidosis 01/29/2023    Acute metabolic encephalopathy 01/29/2023    AKI (acute kidney injury) (CMS-HCC) 01/29/2023    Anemia     Anemia due to stage 5 chronic kidney disease, not on chronic dialysis (CMS-HCC) 08/08/2022    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)     since July 2017    Hypertension     Hypothyroidism (acquired)     Kidney transplant status, cadaveric 08/08/2022    Type 2 diabetes mellitus (CMS-HCC)      Past Surgical History:   Procedure Laterality Date    CATARACT EXTRACTION      HYSTERECTOMY      OOPHORECTOMY      PERITONEAL CATHETER INSERTION      PR LAP INSERTION TUNNELED INTRAPERITONEAL CATHETER N/A 09/27/2018    Procedure: LAPAROSCOPY, SURGICAL; WITH INSERTION OF INTRAPERITONEAL CANNULA OR CATHETER, PERMANENT;  Surgeon: Leona Carry, MD;  Location: MAIN OR [place];  Service: Transplant    PR LAP INSERTION TUNNELED INTRAPERITONEAL CATHETER N/A 04/08/2019    Procedure: LAPAROSCOPY, SURGICAL; WITH INSERTION OF INTRAPERITONEAL CANNULA OR CATHETER, PERMANENT;  Surgeon: Leona Carry, MD;  Location: MAIN OR [place];  Service: Transplant    PR LAP REVISE INTRAPERITONEAL CATHETER N/A 08/26/2019    Procedure: LAPAROSCOPY, SURGICAL; W/REVIS PREV PLACED INTRAPERITONEAL CANNULA/CATH, REMOV INTRALUMIN OBSTRUCT MATERIAL;  Surgeon: Leona Carry, MD;  Location: MAIN OR [facility];  Service: Transplant    PR REDUCE VOLVULUS,INTUSS,INTERN HERNIA N/A 10/13/2022    Procedure: REDUCTION OF VOLVULUS, INTUSSUSCEPTION, INTERNAL HERNIA, BY LAPAROTOMY;  Surgeon: Suella Broad, MD;  Location: MAIN OR [place];  Service: Trauma    PR REMOVAL TUNNELED INTRAPERITONEAL CATHETER N/A 05/06/2018    Procedure: REMOVAL OF PERMANENT INTRAPERITONEAL CANNULA OR CATHETER;  Surgeon: Leona Carry, MD;  Location: MAIN OR [facility];  Service: Transplant    PR REMOVE PERITONEAL FOREIGN BODY N/A 04/08/2019    Procedure: Removal Of Peritoneal Of Foreign Body From Peritoneal Cavity;  Surgeon: Leona Carry, MD;  Location: MAIN OR [facility];  Service: Transplant    PR TRANSPLANT,PREP RENAL GRAFT/ARTERIAL Right 08/06/2022    Procedure: [facility] RECONSTRUCTION CADAVER/LIVING DONOR RENAL ALLOGRAFT PRIOR TO TRANSPLANT; ARTERIAL ANASTOMOSIS EAC;  Surgeon: Toledo, Lilyan Punt, MD;  Location: MAIN OR [facility];  Service: Transplant    PR TRANSPLANTATION OF KIDNEY Right 08/06/2022    Procedure: RENAL ALLOTRANSPLANTATION, IMPLANTATION OF GRAFT; WITHOUT RECIPIENT NEPHRECTOMY;  Surgeon: Toledo, Lilyan Punt, MD;  Location: MAIN OR [facility];  Service: Transplant    TUBAL LIGATION       Social History:  Social History     Social History Narrative    Lives in [place] with her husband. Moved from [place] to the [place] when she was around 19.      reports that she quit smoking about 16 years ago. Her smoking use included cigarettes. She started smoking about 46 years ago. She has a 22.5 pack-year smoking history. She has quit using smokeless tobacco. She reports current alcohol use. She reports that she does not use drugs.     Family History:  Family History   Problem Relation Age of Onset    Diabetes Mother     Alzheimer's disease Father     Diabetes Sister     Diabetes Sister     Diabetes Sister     Anesthesia problems Neg Hx     Bleeding Disorder Neg Hx       Physical Exam:   Vitals:  05/18/23 1000 05/18/23 1100 05/18/23 1200 05/18/23 1414   BP: 127/36 151/39 138/46 144/53   Pulse: 68 68 69 85   Resp: 15 15 12 21    Temp:    36.9 ??C (98.4 ??F)   TempSrc:       SpO2: 96% 99% 98% 98%   Weight:         No intake/output data recorded.  No intake or output data in the 24 hours ending 05/18/23 1558    Constitutional: ill-appearing, no acute distress  Heart: regular rate and rhythm, no murmurs, rubs, or gallops  Lungs: clear to auscultation bilaterally without adventitious sounds  Abd: soft, non-tender, non-distended  Ext: no lower extremity edema

## 2023-05-18 NOTE — Unmapped (Signed)
Bed: 73-D  Expected date:   Expected time:   Means of arrival:   Comments:  A5-Siobhan Zaro

## 2023-05-18 NOTE — Unmapped (Signed)
Transplant Nephrology Consult     Requesting Attending Physician :  Julie Combes, MD  Service Requesting Consult : Med [facility] Julie [name] ([facility])  Reason for Consult: DDKT (08/06/2022)    Assessment and Plan:    # S/p Kidney Transplant, Kidney allograft function  # Non-Oliguric AKI  - Serum creatinine level is at 1.44 on presentation to ED  - bCr 0.9-1.2  - post-transplant course has been complicated by many recurrent UTIs requiring admission, SBO/ileus 2/2 to volvulus, and resistant HTN  - DSA negative in 01/2023  - stent removed 2/15  - etiology of AKI likely in setting of active infection (UA concerning for recurrent infection). Possible pre-renal though less likely as husband reports that GI symptoms started while in the ED & reports normal appetite. BP stable at home  - recommendations:   - would recommend volume assessment of patient to determine if hydration is needed. If so, would recommend oral hydration protocol but if unable would recommend IV LR 100 ml/hr for 10 hours   - would recommend obtaining transplant [place] to reassess for previous perinephric fluid collection (last CT on 9/12 showed resolution)   - would recommend repeating DSA levels   - would recommend obtaining UPCR (do expect some degree of elevation in setting of AKI but if significant elevated would be concerning)   - would recommend strict I&Os & renally dosing all medications for eGFR <15    # Immunosuppression:  - maintenance IS: Belatacept 5 mg/kg every 2 weeks (last dose 10/17), MMF 250 mg BID, & prednisone 5 mg daily   - previously was on Tac but ended around 10/3. Switched to help with cognition, BP control, and decrease risk of pre-renal AKI   - patient did not take morning IS today  - recommendations:   - continue prednisone 5 mg daily   - reasonable to await 24 hour blood cultures before restarting MMF given history of MDR UTI + history of bacteremia (also patient with low-risk of rejection given negative DSA, no prior transplants, or history of DGF)    # Blood Pressure / Volume:  - home regiemn: Norvasc 5 mg BID, Coreg 25 mg BID, chlorthalidone 50 mg daily, hydralazine 100 mg daily, and Aldactone 25 mg daily  - recommendations:   - would address external cause of BP including any nausea/vomiting/pain   - would recommend holding home thiazide at this time given AKI   - okay to continue other home BP regimen but be cautious with likely ongoing infection & to monitor BP closely    # Infectious Prophylaxis and Monitoring  # History of ESBL E coli UTI  # Fever in IS Patient  - CMV: D+/R+  - EBV: D+/R+  - finished ppx medications of Bactrim & Valcyte  - history of ESBL E coli UTI that has required x5 admissions. Etiology thought to have been 2/2 to a perinephric fluid collection   - previously seeing ID in which patient was on IV Ertapenem that ended around 9/13 w/ tunneled CVC removed on 9/16   - prior CT scan on 9/12 showed resolution of fluid collection   - ID to have started on ppx fosfomycin   - was supposed to urology on 08/08 but cancelled by patient  - patient also received flu & COVID vaccine this past Monday  - UA on admission concerning for ongoing infection including large LE, small blood, >182 WBC, 8 RBC, and moderate bacteria  - blood & urine culutres collected. Respiratory panel negative  -  CXR showing no areas of consolidation  - received IV Vancomycin + Cefepime in ED  - recommendations:   - agree with broad spectrum antibiotics. Can consider switching Merrem to Ertapenem given prior culture data & to reduce exposure to anti-pseudomonal agents to help prevent resistant pathogen   - would recommend ID consult in AM given recurrent infections   - would recommend urology consultation as well since patient missed last appointment & to see if another etiology could be explaining her recurrent infections given her now resolved peri-nephric fluid collection   - would recommend repeating CMV & EBV PCR levels (both negative back in 01/2023)    RECOMMENDATIONS:   - volume assessment with appropriate fluid choice as described above  - transplant [place]  - ordering: DSA, UPCR, EBV & CMV PCR  - continue Prednisone 5 mg daily; okay to hold MMF for now till blood cultures result after 24 hours given history of ESBL bacteremia & recurrent infections  - hold chlorthalidone in setting of AKI  - consider switching Merrem to Ertapenem  - consider consulting ID & urology in AM  - Transplant patients with an open wound require wound care with sterile water only. The patient should be counseled on this at the time of discharge if they have not already been doing this.  - We will continue to follow.     Julie Mc, DO  05/17/2023 9:12 PM     Medical decision-making for 05/17/23  Findings / Data     Patient has: [x]  acute illness w/systemic sxs  [mod]  []  two or more stable chronic illnesses [mod]  []  one chronic illness with acute exacerbation [mod]  []  acute complicated illness  [mod]  []  Undiagnosed new problem with uncertain prognosis  [mod] [x]  illness posing risk to life or bodily function (ex. AKI)  [high]  []  chronic illness with severe exacerbation/progression  [high]  []  chronic illness with severe side effects of treatment  [high] Non-oliguric AKI; UTI Probs At least 2:  Probs, Data, Risk   I reviewed: [x]  primary team note  [x]  consultant note(s)  []  external records [x]  chemistry results  [x]  CBC results  []  blood gas results  [x]  Other []  procedure/op note(s)   [x]  radiology report(s)  []  micro result(s)  []  w/ independent historian(s) Prior transplant notes, ID notes, Cr elevated, UA consistent with UTI >=3 Data Review (2 of 3)    I independently interpreted: []  Urine Sediment  []  Renal US []  CXR Images  []  CT Images  []  Other []  EKG Tracing N/A Any     I discussed: []  Pathology results w/ QHPs(s) from other specialties  []  Procedural findings w/ QHPs(s) from other specialties []  Imaging w/ QHP(s) from other specialties  [x]  Treatment plan w/ QHP(s) from other specialties Plan discussed with primary team Any     Mgm't requires: []  Prescription drug(s)  [mod]  []  Kidney biopsy  [mod]  []  Central line placement  [mod] [x]  High risk medication use and/or intensive toxicity monitoring [high]  []  Renal replacement therapy [high]  []  High risk kidney biopsy  [high]  []  Escalation of care  [high]  []  High risk central line placement  [high] Immunosuppression: high risk for infection Risk      _____________________________________________________________________________________    Transplant Background  Date of Transplant: 08/06/2022 (Kidney)  Type of Transplant: DCD  KDPI: 56%  Cold ischemic time: 877 minutes (14 hr 37 min)  Warm ischemic time: 32 minutes  cPRA:  0%  HLA match:   Blood type: Donor A1, Recipient A POS  ID: CMV D+/R+, EBV D+/R+  Native Kidney Disease: Diabetes              Native kidney biopsy: Diabetic nephropathy (done elsewhere)              Pre-transplant dialysis course: PD started July 2017, transitioned to HD October 2019 due to catheter dysfunction/inadequate dialysis  Prior Transplants: None  Induction: Basiliximab  Early steroid withdrawal: No     Biopsies:   None    History of Present Illness:  Julie [name] is a/an 72 y.o. 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 Nephrology has been consulted for DDKT.     History is obtained from husband over the phone. According to him, both he and Ms. Jantz received their flu + COVID vaccine this past Monday. However, patient started to have low-grade fevers around 99.4 F 2 days later. Yesterday, patient started to have recurrent fevers that improved with Tylenol up to 102.7 F. Associated symptoms during this time include: vomiting & loose stool (husband reports this started when at the ED). No URI symptoms, cough, LAD, abdominal pain, GU, rashes, arthralgia, or meningeal signs. Patient does have a LUE AVF but no concerning features for infection. Patient has not traveled recently but did recently return to church this past Sunday for the first time since her transplant. Did not report any sick exposures. Appetite has been good as have BP with average being between 140-150s/40-50s per husband. No NSAID use.    INPATIENT MEDICATIONS:    Current Facility-Administered Medications:     acetaminophen (OFIRMEV) 10 mg/mL injection 1,000 mg, Intravenous, Q8H PRN    [Provider Hold] alendronate (FOSAMAX) tablet 70 mg, Oral, Q7 Days    amlodipine (NORVASC) tablet 5 mg, Oral, Q12H    atorvastatin (LIPITOR) tablet 10 mg, Oral, Daily    carvedilol (COREG) tablet 25 mg, Oral, BID    [START ON 05/18/2023] chlorthalidone (HYGROTON) tablet 50 mg, Oral, Daily    [START ON 05/18/2023] cholecalciferol (vitamin D3 25 mcg (1,000 units)) tablet 50 mcg, Oral, Daily    dextrose (D10W) 10% bolus 125 mL, Intravenous, Q10 Min PRN    glucagon injection 1 mg, Intramuscular, Once PRN    glucose chewable tablet 16 g, Oral, Q10 Min PRN    hydrALAZINE (APRESOLINE) tablet 100 mg, Oral, TID    [START ON 05/18/2023] insulin glargine (LANTUS) injection 5 Units, Subcutaneous, Nightly    insulin lispro (HumaLOG) injection 0-20 Units, Subcutaneous, ACHS    [START ON 05/18/2023] levothyroxine (SYNTHROID) tablet 112 mcg, Oral, daily    melatonin tablet 6 mg, Oral, Nightly    meropenem (MERREM) 500 mg in sodium chloride 0.9 % (NS) 100 mL IVPB-MBP, Intravenous, Q12H    [Provider Hold] mycophenolate (CELLCEPT) capsule 250 mg, Oral, BID    ondansetron (ZOFRAN) injection 4 mg, Intravenous, Q8H PRN    [START ON 05/18/2023] pantoprazole (Protonix) EC tablet 40 mg, Oral, Daily    predniSONE (DELTASONE) tablet 5 mg, Oral, Daily    sodium bicarbonate tablet 650 mg, Oral, TID    [START ON 05/18/2023] spironolactone (ALDACTONE) tablet 25 mg, Oral, Daily  OUTPATIENT MEDICATIONS:  Prior to Admission medications    Medication Dose, Route, Frequency   amlodipine (NORVASC) 5 MG tablet 5 mg, Oral,  Every 12 hours   atorvastatin (LIPITOR) 10 MG tablet 10 mg, Oral, Daily (standard)   carvedilol (COREG) 25 MG tablet 25 mg, Oral, 2 times a day (standard)   chlorthalidone (HYGROTON) 25 MG tablet 50 mg, Oral, Daily (standard)   estradiol (ESTRACE) 0.01 % (0.1 mg/gram) vaginal cream 2 g, Vaginal, Nightly   fosfomycin (MONUROL) 3 gram Pack 3 g, Oral, Weekly   hydrALAZINE (APRESOLINE) 100 MG tablet 100 mg, Oral, 3 times a day (standard)   insulin glargine (BASAGLAR, LANTUS) 100 unit/mL (3 mL) injection pen 5 Units, Subcutaneous, Nightly   levothyroxine (SYNTHROID) 112 MCG tablet 112 mcg, Oral, Daily (standard)   magnesium oxide (MAG-OX) 400 mg (241.3 mg elemental magnesium) tablet 400 mg, Oral, 2 times a day (standard)   melatonin 3 mg Tab 2 tablets, Oral, Nightly, Taking 3 mg every night   mycophenolate (CELLCEPT) 250 mg capsule 250 mg, Oral, 2 times a day (standard)   pantoprazole (PROTONIX) 40 MG tablet 40 mg, Oral, Daily PRN   predniSONE (DELTASONE) 5 MG tablet 5 mg, Oral, Daily   sodium bicarbonate 650 mg tablet 650 mg, Oral, 3 times a day (standard)   spironolactone (ALDACTONE) 25 MG tablet 25 mg, Oral, Daily (standard)   tacrolimus (PROGRAF) 0.5 MG capsule Take 3 capsules (1.5 mg total) by mouth daily AND 2 capsules (1 mg total) nightly.   alendronate (FOSAMAX) 70 MG tablet 70 mg, Oral, Every 7 days   BAQSIMI 3 mg/actuation Spry    blood-glucose sensor (DEXCOM G7 SENSOR) Devi Use 1 sensor every 10 days.   cholecalciferol, vitamin D3 25 mcg, 1,000 units,, (CHOLECALCIFEROL-25 MCG, 1,000 UNIT,) 1,000 unit (25 mcg) tablet 50 mcg, Oral, Daily (standard)   cranberry extract/vitamin C (AZO CRANBERRY PLUS VIT C ORAL) 2 tablets, Oral, Daily   cyanocobalamin, vitamin B-12, 1000 MCG tablet 1,000 mcg, Oral, Daily (standard)   D-MANNOSE ORAL 2,000 mg, Oral, Daily   insulin aspart (NOVOLOG) 100 unit/mL vial 3-5 Units, Subcutaneous, 3 times a day (AC)   lancets Misc Use to check blood sugar as directed with insulin 3 times a day & for symptoms of high or low blood sugar.      ALLERGIES:  Gabapentin and Valacyclovir  MEDICAL HISTORY:  Past Medical History:   Diagnosis Date    Acute metabolic acidosis 01/29/2023    Acute metabolic encephalopathy 01/29/2023    AKI (acute kidney injury) (CMS-HCC) 01/29/2023    Anemia     Anemia due to stage 5 chronic kidney disease, not on chronic dialysis (CMS-HCC) 08/08/2022    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)     since July 2017    Hypertension     Hypothyroidism (acquired)     Kidney transplant status, cadaveric 08/08/2022    Type 2 diabetes mellitus (CMS-HCC)      Past Surgical History:   Procedure Laterality Date    CATARACT EXTRACTION      HYSTERECTOMY      OOPHORECTOMY      PERITONEAL CATHETER INSERTION      PR LAP INSERTION TUNNELED INTRAPERITONEAL CATHETER N/A 09/27/2018    Procedure: LAPAROSCOPY, SURGICAL; WITH INSERTION OF INTRAPERITONEAL CANNULA OR CATHETER, PERMANENT;  Surgeon: Leona Carry, MD;  Location: MAIN OR [place];  Service: Transplant    PR LAP INSERTION TUNNELED INTRAPERITONEAL CATHETER N/A 04/08/2019    Procedure: LAPAROSCOPY, SURGICAL; WITH INSERTION OF INTRAPERITONEAL CANNULA OR CATHETER, PERMANENT;  Surgeon: Leona Carry, MD;  Location: MAIN OR [facility];  Service: Transplant    PR LAP REVISE  INTRAPERITONEAL CATHETER N/A 08/26/2019    Procedure: LAPAROSCOPY, SURGICAL; W/REVIS PREV PLACED INTRAPERITONEAL CANNULA/CATH, REMOV INTRALUMIN OBSTRUCT MATERIAL;  Surgeon: Leona Carry, MD;  Location: MAIN OR [facility];  Service: Transplant    PR REDUCE VOLVULUS,INTUSS,INTERN HERNIA N/A 10/13/2022    Procedure: REDUCTION OF VOLVULUS, INTUSSUSCEPTION, INTERNAL HERNIA, BY LAPAROTOMY;  Surgeon: Suella Broad, MD;  Location: MAIN OR [place];  Service: Trauma    PR REMOVAL TUNNELED INTRAPERITONEAL CATHETER N/A 05/06/2018    Procedure: REMOVAL OF PERMANENT INTRAPERITONEAL CANNULA OR CATHETER;  Surgeon: Leona Carry, MD;  Location: MAIN OR [facility];  Service: Transplant    PR REMOVE PERITONEAL FOREIGN BODY N/A 04/08/2019    Procedure: Removal Of Peritoneal Of Foreign Body From Peritoneal Cavity;  Surgeon: Leona Carry, MD;  Location: MAIN OR [facility];  Service: Transplant    PR TRANSPLANT,PREP RENAL GRAFT/ARTERIAL Right 08/06/2022    Procedure: [facility] RECONSTRUCTION CADAVER/LIVING DONOR RENAL ALLOGRAFT PRIOR TO TRANSPLANT; ARTERIAL ANASTOMOSIS EAC;  Surgeon: Toledo, Lilyan Punt, MD;  Location: MAIN OR [facility];  Service: Transplant    PR TRANSPLANTATION OF KIDNEY Right 08/06/2022    Procedure: RENAL ALLOTRANSPLANTATION, IMPLANTATION OF GRAFT; WITHOUT RECIPIENT NEPHRECTOMY;  Surgeon: Toledo, Lilyan Punt, MD;  Location: MAIN OR [facility];  Service: Transplant    TUBAL LIGATION       SOCIAL HISTORY  Social History     Social History Narrative    Lives in [place] with her husband. Moved from [place] to the [place] when she was around 19.      reports that she quit smoking about 16 years ago. Her smoking use included cigarettes. She started smoking about 46 years ago. She has a 22.5 pack-year smoking history. She has quit using smokeless tobacco. She reports current alcohol use. She reports that she does not use drugs.   FAMILY HISTORY  Family History   Problem Relation Age of Onset    Diabetes Mother     Alzheimer's disease Father     Diabetes Sister     Diabetes Sister     Diabetes Sister     Anesthesia problems Neg Hx     Bleeding Disorder Neg Hx         Physical Exam:  Vitals:    05/17/23 1610 05/17/23 1700 05/17/23 1800 05/17/23 1957   BP: 165/41 171/41 148/37    Pulse: 78 74 67    Resp: 21 20 21     Temp:  37.4 ??C (99.4 ??F)  (!) 39 ??C (102.2 ??F)   TempSrc:  Oral  Oral   SpO2: 95% 96% 97%    Weight:         No intake/output data recorded.  No intake or output data in the 24 hours ending 05/17/23 2112    Physical exam not performed. Formal consult to follow in AM. Plan subjected to change.

## 2023-05-18 NOTE — Unmapped (Signed)
ED Progress Note    Received sign out from previous provider.    Patient Summary: Torrey Pons is a 72 y.o. female past medical history as below who presents with fever. Patient reports that she developed a fever yesterday with a high of 102 ??F. They have been taking Tylenol every 4 hours. Patient has also been having some generalized fatigue and nasal congestion. Denies any cough, sore throat, nausea, vomiting, chest pain, abdominal pain, dysuria, hematuria, diarrhea.   Action List:   Follow-up on septic workup.    Updates  ED Course as of 05/17/23 2034   Thu May 17, 2023   1529 Johnathan HausenMarland Kitchen)(S): 39.1 ??C (102.3 ??F)  Given patient's recurrent fever up to 102.3 as well as no clear source of infection, her immune compromised status, and rigors reported by nursing staff I do of some concern that this is not just a respiratory infection may be related to bacteremia.  Will give Tylenol and empirically antibiosis patient.  Believe that she would likely benefit from admission to trend blood cultures.   1553 Lactate, Venous: 1.8   1639 WBC(!): 12.8   1824 IMPRESSION:  Prominence of the vasculature and interstitium which may be related to low lung volume; however, clinical correlation is recommended to mild pulmonary edema. No focal consolidation identified.   1824 At this time patient does not have a positive respiratory pathogen panel.  Unclear source of patient's infection.  Will page for admission for trending of blood cultures and ongoing antibiotics.   1950 Care discussed with admitting team   2023 Urinalysis concerning for UTI with greater than 182 WBCs and large leuk esterase.

## 2023-05-18 NOTE — Unmapped (Signed)
Internal Medicine (MEDU) History & Physical    Assessment & Plan:     Julie [name] is a 72 y.o. female whose presentation is complicated by ESRD s/p DDKT 07/2022, recurrent MDR UTIs, HTN, T2DM, SBO/ileus, hyperlipidemia, hypothroidism and chronic idiopathic thrombocytopenia that presented to [facility] with fever, fatigue, and chills with UA findings concerning for UTI.       Principal Problem:    UTI (urinary tract infection)  Active Problems:    Type 2 diabetes mellitus with diabetic nephropathy, with long-term current use of insulin (CMS-HCC)    Hypothyroidism, acquired    Kidney transplant recipient    Immunosuppressed status (CMS-HCC)    Primary hypertension    Mixed hyperlipidemia    Chronic idiopathic thrombocytopenia (CMS-HCC)    Hyponatremia      Active Problems    C/f Urosepsis - AKI - Hx Recurrent MDR UTIs and Bacteremia: Patient presents with 3 day history of fever, chills, and 3 episodes of NBNB emesis in ED. ED work-up notable for WBC 12.8, Plt 100, BUN 35, Cr 1.44. UA with large LE, >182 WBC, 8 RBC, moderate bacteria. In a patient with chronic immunosuppression due to transplant related immunosuppression, elevated concern for infection including infection caused by atypical pathogen. Differential includes UTI, meningitis/encephalitis, URI, pneumonia, gastroenteritis, infective endocarditis, cellulitis, and diabetic foot wound. Patient denies symptoms per ROS other than fever and chills. Patient does have reduced engagement in exam which raises concern for meningitis or encephalitis, but denies headache, neck stiffness, or focal neurologic symptoms which lowers concern for this etiology. Patient has no upper respiratory symptoms, cough, SOB, or chest pain reducing probability of respiratory pathology. No cardiac symptoms and benign exam lowers probability of infective endocarditis. No cellulitis or wounds noted on exam. Cannot rule out acute gastroenteritis given vomiting, however, vomiting only started after arriving to ED, patient without abdominal pain, and vomiting common symptom during patient's prior UTIs. Overall, patient's presentation most concerning for urosepsis and possible bacteremia given history of previous similar presentations. Transplant nephrology consulted and recommended transplant [place] and additional labs. AKI likely secondary to active infection. Patient started on Ertapenem based on previous urine culture susceptibilities. Will consult ICID in AM given likely MDR infection in immunocompromised host.  Given recurrent UTI, do wonder if further imaging such as CT would be useful.  Will defer to day team conversation between primary team, nephrology, and ICD.  - Transplant nephrology following; appreciate recs  - Consult ICID in AM  - IV Ertapenem  - Oral rehydration -- consider IV hydration if unable to take orally  - Renal transplant [place] with doppler  - Repeat DSA levels  - CMV, EBV  - Obtain UPCR  - Daily CBCd and CMP  - F/u urine culture  - F/u blood cultures   - IV Tylenol q8h PRN  - IV Zofran q8h PRN  - Strict Is/Os  - Renally dose all meds for eGFR <15  - Tylenol PRN fever    Renal transplant on immunosuppression: Patient received renal transplant in January 2024, on chronic immunosuppression. Receives maintenance Belatacept 5 mg/kg every 2 weeks (last dose 10/17).  - Nephrology following  - Home Prednisone 5 mg daily  - HOLD home mycophenolate -- while awaiting 24 hr blood cultures   - Home sodium bicarb 650 TID     Hypertension: Patient has chronic hypertension and was noted to be hypertensive on admission and by vitals during exam ranging between 148-177/37-44, and observed to be 151/114 during exam. She  did not take any of her AM home BP meds. Home blood pressure regimen was restarted (details). Widened pulse pressure is noted on documented blood pressure. Differential includes sepsis, fistula, infective endocarditis, aortic stenosis, severe anemia, or hyperthyroidism. Normal cardiac exam with no noted murmur reduces the probability of valvular pathology. Normal RBCs and hematocrit rule out anemia. Patient diagnosis of hypothyroidism with no documented increase in home levothyroxine rule out hyperthyroidism. Clinical stability, benign cardiac exam, and otherwise stable vitals reduce probability of infective endocarditis. Etiology of wide pulse pressure is likely secondary to sepsis  - Continue home spironolactone, amlodipine, hydralazine, carvedilol -- low threshold to hold medications if becomes hypotensive  - Hold home chlorthalidone iso AKI   - Continue to monitor blood pressures    Chronic Problems    T2DM: Patient has history of type 2 DM, on home lantus 5 units, lispro  - Lantus 5u nightly   - SSI    Hyperlipidemia: Not assessed at admission, taking home atorvastatin.   - Continued home atorvastatin    Chronic idiopathic thrombocytopenia: Noted low platelets on CBC on admission this appears to be a chronic problem documented at least until 09/2018. Platelets near baseline.  - Continue to monitor with routine CBCs  - Reassess if platelets in critical range (<20000)    Hypothyroidism: Patient has chronic hypothyroidism on levothyroxine  - Continue home levothyroxine    Osteoporosis: Noted to have osteoporosis in chart.   - Continue home cholecalciferol  - Hold home alondronate    The patient's presentation is complicated by the following clinically significant conditions requiring additional evaluation and treatment: - Hyponatremia POA requiring further investigation, treatment, or monitoring  - Dehydration POA requiring further investigation, treatment, or monitoring    Checklist:  Diet: Regular Diet  DVT PPx: Heparin 5000units q8h  Code Status: Full Code  Dispo: Patient appropriate for  ??Inpatient based on expectation of ongoing need for hospitalization greater than two midnights and severity of presentation/services including IV antibiotics    Team Contact Information:   Primary Team: Internal Medicine (MEDU)  Primary Resident: Violet Baldy, MD  Resident's Pager: [phone] MedU Intern - Alvester Morin)    Chief Concern:     UTI (urinary tract infection)    Subjective:     Julie [name]) is a 72 y.o. female with pertinent PMHx of renal transplant on immunosuppression, diabetes, recurrent MDR UTI, small bowel obstruction/ileus, hypertension, and hyperlipidemia presenting with fever and chills and no other endorsed symptoms.     History obtained from patient and husband, patient participation limited mostly to yes and no. Patient's hearing aids were poorly functional.     HPI:  Julie [name] [facility]) is a 72 y.o. female with pertinent PMHx of renal transplant on immunosuppression, diabetes, recurrent UTI, small bowel obstruction/ileus, hypertension, hyperlipidemia, and chronic idiopathic thrombocytopenia presenting with fever and chills and no other endorsed symptoms. History was obtained from patient and partner, Guylene Jentzsch, due to reduced patient participation in interview. In January, patient received left kidney transplant for chronic kidney disease. She has been previously hospitalized for MDR UTI and last hospitalization for UTI was in June with a similar presentation. Patient received her flu and COVID vaccine on 10/21 and has been feeling poorly since. She spiked fever to 102 at home on 10/23 and has had intermittent fevers since. Fevers defervesce with PRN Tylenol which she has been taking at home. Her husband brought her to the ED this morning when the fever did  not break with acetaminophen.    In the ED she had 3 episodes of non-bloody, non-bilious emesis and one episode of diarrhea.    She denies headache, neck stiffness, eye pain/dryness, congestion, sore throat, chest pain, shortness of breath, abdominal pain, joint pain, skin changes, hematuria, dysuria. She denies any known sick contacts.     She and her husband state that her UTIs typically present with fevers and no other symptoms.     ED Course: Patient presented with fever.  Vital Signs: Tmax 39.1, HR 65-85, RR 20, BP 177/44, SpO2 97-100% on RA.  Labs: WBC 12.8 (ANC 11.2), RBC 3.76, platelet 100, Na 133, BUN 35, Cr 1.44, EGFR 38  - Lactate normal  - RPP negative  Cultures: BCX x2, UCX,   EKG: NSR, T wave inversions lead II, V5, V6  CXR: 2 view, Prominence of the vasculature and interstitium  Imaging: No additional  Consults: None  Interventions: Vancomycin and Cefepime    Pertinent Surgical Hx  L Renal transplant - January 2024  Arteriovenous fistula placement  Hysterectomy & oopherectomy     Pertinent Family Hx  Family History   Problem Relation Age of Onset    Diabetes Mother     Alzheimer's disease Father     Diabetes Sister     Diabetes Sister     Diabetes Sister     Anesthesia problems Neg Hx     Bleeding Disorder Neg Hx        Pertinent Social Hx   Lives alone with husband  No smoking, alcohol, or recreational substance use    Allergies  Gabapentin and Valacyclovir    I reviewed the Medication List. The current list is Accurate  Prior to Admission medications    Medication Dose, Route, Frequency   amlodipine (NORVASC) 5 MG tablet 5 mg, Oral, Every 12 hours   atorvastatin (LIPITOR) 10 MG tablet 10 mg, Oral, Daily (standard)   carvedilol (COREG) 25 MG tablet 25 mg, Oral, 2 times a day (standard)   chlorthalidone (HYGROTON) 25 MG tablet 50 mg, Oral, Daily (standard)   estradiol (ESTRACE) 0.01 % (0.1 mg/gram) vaginal cream 2 g, Vaginal, Nightly   fosfomycin (MONUROL) 3 gram Pack 3 g, Oral, Weekly   hydrALAZINE (APRESOLINE) 100 MG tablet 100 mg, Oral, 3 times a day (standard)   insulin glargine (BASAGLAR, LANTUS) 100 unit/mL (3 mL) injection pen 5 Units, Subcutaneous, Nightly   levothyroxine (SYNTHROID) 112 MCG tablet 112 mcg, Oral, Daily (standard)   magnesium oxide (MAG-OX) 400 mg (241.3 mg elemental magnesium) tablet 400 mg, Oral, 2 times a day (standard)   melatonin 3 mg Tab 2 tablets, Oral, Nightly, Taking 3 mg every night   mycophenolate (CELLCEPT) 250 mg capsule 250 mg, Oral, 2 times a day (standard)   pantoprazole (PROTONIX) 40 MG tablet 40 mg, Oral, Daily PRN   predniSONE (DELTASONE) 5 MG tablet 5 mg, Oral, Daily   sodium bicarbonate 650 mg tablet 650 mg, Oral, 3 times a day (standard)   spironolactone (ALDACTONE) 25 MG tablet 25 mg, Oral, Daily (standard)   tacrolimus (PROGRAF) 0.5 MG capsule Take 3 capsules (1.5 mg total) by mouth daily AND 2 capsules (1 mg total) nightly.   alendronate (FOSAMAX) 70 MG tablet 70 mg, Oral, Every 7 days   BAQSIMI 3 mg/actuation Spry    blood-glucose sensor (DEXCOM G7 SENSOR) Devi Use 1 sensor every 10 days.   cholecalciferol, vitamin D3 25 mcg, 1,000 units,, (CHOLECALCIFEROL-25 MCG, 1,000 UNIT,) 1,000 unit (25 mcg) tablet  50 mcg, Oral, Daily (standard)   cranberry extract/vitamin C (AZO CRANBERRY PLUS VIT C ORAL) 2 tablets, Oral, Daily   cyanocobalamin, vitamin B-12, 1000 MCG tablet 1,000 mcg, Oral, Daily (standard)   D-MANNOSE ORAL 2,000 mg, Oral, Daily   insulin aspart (NOVOLOG) 100 unit/mL vial 3-5 Units, Subcutaneous, 3 times a day (AC)   lancets Misc Use to check blood sugar as directed with insulin 3 times a day & for symptoms of high or low blood sugar.       Designated Healthcare Decision Maker:  Ms. Takemura currently has decisional capacity for healthcare decision-making and is able to designate a surrogate healthcare decision maker. Ms. Wallock designated healthcare decision maker(s) is/are Gypsie Lybrook (the patient's spouse) as denoted by stated patient preference.    Objective:   Physical Exam:  Temp:  [36.9 ??C (98.5 ??F)-39.1 ??C (102.3 ??F)] 36.9 ??C (98.5 ??F)  Heart Rate:  [67-84] 69  SpO2 Pulse:  [67-78] 69  Resp:  [16-26] 22  BP: (122-181)/(36-82) 122/36  SpO2:  [95 %-100 %] 99 %    Gen: NAD, answers primarily in yes/no.  Eyes: Sclera anicteric, EOMI grossly normal   HENT: Atraumatic, normocephalic  Neck: Trachea midline  Heart: tachycardic  Lungs: CTAB, no crackles or wheezes  Abdomen: Soft, NTND  Extremities: No edema  Neuro: Grossly symmetric, non-focal    Skin:  No rashes, lesions on clothed exam  Psych: Alert, oriented x4. Patient has trouble focusing but is redirectable.    I attest that I have reviewed the medical student note and that the components of the history of the present illness, the physical exam, and the assessment and plan documented were performed by me or were performed in my presence by the student where I verified the documentation and performed (or re-performed) the exam and medical decision making. Violet Baldy, MD

## 2023-05-19 LAB — COMPREHENSIVE METABOLIC PANEL
ALBUMIN: 3 g/dL — ABNORMAL LOW (ref 3.4–5.0)
ALKALINE PHOSPHATASE: 67 U/L (ref 46–116)
ALT (SGPT): 12 U/L (ref 10–49)
ANION GAP: 6 mmol/L (ref 5–14)
AST (SGOT): 11 U/L (ref <=34)
BILIRUBIN TOTAL: 0.5 mg/dL (ref 0.3–1.2)
BLOOD UREA NITROGEN: 46 mg/dL — ABNORMAL HIGH (ref 9–23)
BUN / CREAT RATIO: 24
CALCIUM: 8.2 mg/dL — ABNORMAL LOW (ref 8.7–10.4)
CHLORIDE: 103 mmol/L (ref 98–107)
CO2: 24 mmol/L (ref 20.0–31.0)
CREATININE: 1.91 mg/dL — ABNORMAL HIGH
EGFR CKD-EPI (2021) FEMALE: 27 mL/min/{1.73_m2} — ABNORMAL LOW (ref >=60)
GLUCOSE RANDOM: 174 mg/dL (ref 70–179)
POTASSIUM: 3.4 mmol/L (ref 3.4–4.8)
PROTEIN TOTAL: 5.9 g/dL (ref 5.7–8.2)
SODIUM: 133 mmol/L — ABNORMAL LOW (ref 135–145)

## 2023-05-19 LAB — CBC W/ AUTO DIFF
BASOPHILS ABSOLUTE COUNT: 0 10*9/L (ref 0.0–0.1)
BASOPHILS RELATIVE PERCENT: 0.1 %
EOSINOPHILS ABSOLUTE COUNT: 0 10*9/L (ref 0.0–0.5)
EOSINOPHILS RELATIVE PERCENT: 0.3 %
HEMATOCRIT: 26.8 % — ABNORMAL LOW (ref 34.0–44.0)
HEMOGLOBIN: 9.4 g/dL — ABNORMAL LOW (ref 11.3–14.9)
LYMPHOCYTES ABSOLUTE COUNT: 0.4 10*9/L — ABNORMAL LOW (ref 1.1–3.6)
LYMPHOCYTES RELATIVE PERCENT: 5.4 %
MEAN CORPUSCULAR HEMOGLOBIN CONC: 35.3 g/dL (ref 32.0–36.0)
MEAN CORPUSCULAR HEMOGLOBIN: 32.4 pg (ref 25.9–32.4)
MEAN CORPUSCULAR VOLUME: 91.9 fL (ref 77.6–95.7)
MEAN PLATELET VOLUME: 8.5 fL (ref 6.8–10.7)
MONOCYTES ABSOLUTE COUNT: 0.5 10*9/L (ref 0.3–0.8)
MONOCYTES RELATIVE PERCENT: 5.9 %
NEUTROPHILS ABSOLUTE COUNT: 7.3 10*9/L (ref 1.8–7.8)
NEUTROPHILS RELATIVE PERCENT: 88.3 %
PLATELET COUNT: 82 10*9/L — ABNORMAL LOW (ref 150–450)
RED BLOOD CELL COUNT: 2.91 10*12/L — ABNORMAL LOW (ref 3.95–5.13)
RED CELL DISTRIBUTION WIDTH: 13.9 % (ref 12.2–15.2)
WBC ADJUSTED: 8.2 10*9/L (ref 3.6–11.2)

## 2023-05-19 LAB — EBV QUANTITATIVE PCR, BLOOD: EBV VIRAL LOAD RESULT: NOT DETECTED

## 2023-05-19 MED ADMIN — ertapenem (INVANZ) 500 mg in sodium chloride (NS) 0.9 % 50 mL IVPB: 500 mg | INTRAVENOUS | @ 01:00:00 | Stop: 2023-05-24

## 2023-05-19 MED ADMIN — amlodipine (NORVASC) tablet 5 mg: 5 mg | ORAL | @ 01:00:00

## 2023-05-19 MED ADMIN — sodium bicarbonate tablet 650 mg: 650 mg | ORAL | @ 18:00:00

## 2023-05-19 MED ADMIN — cholecalciferol (vitamin D3 25 mcg (1,000 units)) tablet 50 mcg: 50 ug | ORAL | @ 13:00:00

## 2023-05-19 MED ADMIN — insulin lispro (HumaLOG) injection 0-20 Units: 0-20 [IU] | SUBCUTANEOUS | @ 16:00:00

## 2023-05-19 MED ADMIN — predniSONE (DELTASONE) tablet 5 mg: 5 mg | ORAL | @ 13:00:00

## 2023-05-19 MED ADMIN — atorvastatin (LIPITOR) tablet 10 mg: 10 mg | ORAL | @ 13:00:00

## 2023-05-19 MED ADMIN — carvedilol (COREG) tablet 25 mg: 25 mg | ORAL | @ 01:00:00

## 2023-05-19 MED ADMIN — hydrALAZINE (APRESOLINE) tablet 100 mg: 100 mg | ORAL | @ 18:00:00

## 2023-05-19 MED ADMIN — sodium bicarbonate tablet 650 mg: 650 mg | ORAL | @ 01:00:00

## 2023-05-19 MED ADMIN — ondansetron (ZOFRAN) injection 4 mg: 4 mg | INTRAVENOUS | @ 13:00:00

## 2023-05-19 MED ADMIN — sodium bicarbonate tablet 650 mg: 650 mg | ORAL | @ 13:00:00

## 2023-05-19 MED ADMIN — insulin lispro (HumaLOG) injection 0-20 Units: 0-20 [IU] | SUBCUTANEOUS | @ 21:00:00

## 2023-05-19 MED ADMIN — melatonin tablet 6 mg: 6 mg | ORAL | @ 01:00:00

## 2023-05-19 MED ADMIN — pantoprazole (Protonix) EC tablet 40 mg: 40 mg | ORAL | @ 13:00:00

## 2023-05-19 MED ADMIN — hydrALAZINE (APRESOLINE) tablet 100 mg: 100 mg | ORAL | @ 01:00:00

## 2023-05-19 MED ADMIN — insulin lispro (HumaLOG) injection 0-20 Units: 0-20 [IU] | SUBCUTANEOUS | @ 11:00:00

## 2023-05-19 MED ADMIN — levothyroxine (SYNTHROID) tablet 112 mcg: 112 ug | ORAL | @ 10:00:00

## 2023-05-19 MED ADMIN — amlodipine (NORVASC) tablet 5 mg: 5 mg | ORAL | @ 13:00:00

## 2023-05-19 MED ADMIN — insulin glargine (LANTUS) injection 5 Units: 5 [IU] | SUBCUTANEOUS | @ 02:00:00

## 2023-05-19 MED ADMIN — insulin lispro (HumaLOG) injection 0-20 Units: 0-20 [IU] | SUBCUTANEOUS | @ 02:00:00

## 2023-05-19 MED ADMIN — carvedilol (COREG) tablet 25 mg: 25 mg | ORAL | @ 13:00:00

## 2023-05-19 MED ADMIN — heparin (porcine) 5,000 unit/mL injection 5,000 Units: 5000 [IU] | SUBCUTANEOUS | @ 18:00:00

## 2023-05-19 MED ADMIN — heparin (porcine) 5,000 unit/mL injection 5,000 Units: 5000 [IU] | SUBCUTANEOUS | @ 02:00:00

## 2023-05-19 MED ADMIN — heparin (porcine) 5,000 unit/mL injection 5,000 Units: 5000 [IU] | SUBCUTANEOUS | @ 10:00:00

## 2023-05-19 MED ADMIN — hydrALAZINE (APRESOLINE) tablet 100 mg: 100 mg | ORAL | @ 13:00:00

## 2023-05-19 NOTE — Unmapped (Signed)
Pt alert and oriented x 4.Up with 1 assist to the bathroom.Falls precautions maintained.Husband at bedside providing support.No complaints of pain.1 complaint of nausea.No active vomiting reported.Prn iv zophran given with positive results.Assisted with ordering meals.Appetite improving slowly.Updated on plan of care by MD.No further needs voiced.VSS.Call bell within reach.plan of care continues.  Problem: Adult Inpatient Plan of Care  Goal: Plan of Care Review  Outcome: Progressing  Flowsheets (Taken 05/19/2023 1428)  Progress: improving  Plan of Care Reviewed With: patient  Goal: Patient-Specific Goal (Individualized)  Outcome: Progressing  Flowsheets (Taken 05/19/2023 1428)  Patient/Family-Specific Goals (Include Timeframe): pt will remain afebrile during this shift 7a-7p  Individualized Care Needs: falls,achs,assist with meals,monitor labs,v/s  Anxieties, Fears or Concerns: none voiced  Goal: Absence of Hospital-Acquired Illness or Injury  Outcome: Progressing  Intervention: Identify and Manage Fall Risk  Recent Flowsheet Documentation  Taken 05/19/2023 1200 by Ileene Rubens, RN  Safety Interventions: bed alarm  Taken 05/19/2023 1000 by Ileene Rubens, RN  Safety Interventions: bed alarm  Taken 05/19/2023 0730 by Ileene Rubens, RN  Safety Interventions: bed alarm  Intervention: Prevent and Manage VTE (Venous Thromboembolism) Risk  Recent Flowsheet Documentation  Taken 05/19/2023 1200 by Ileene Rubens, RN  Anti-Embolism Intervention: (heparin) Other (Comment)  Taken 05/19/2023 1000 by Ileene Rubens, RN  Anti-Embolism Intervention: (heparin) Other (Comment)  Taken 05/19/2023 0730 by Ileene Rubens, RN  Anti-Embolism Intervention: (heparin) Other (Comment)  Goal: Optimal Comfort and Wellbeing  Outcome: Progressing  Goal: Readiness for Transition of Care  Outcome: Progressing  Goal: Rounds/Family Conference  Outcome: Progressing  Flowsheets (Taken 05/19/2023 1428)  Participants: patient   physician   nursing

## 2023-05-19 NOTE — Unmapped (Signed)
Visited with Julie [name] and her partner this afternoon. Julie [name] adamantly deferring participation in OT services despite education and encouragement. Spoke with partner and partner reports pt has been doing okay at home and denies any concerns about returning home.     Asked pt/partner if they would like OT to keep checking on her during acute stay and pt reported no. Will sign off at this time. If pt becomes interested in working with OT in future or if new needs arise, please re-consult with new orders. Thanks!

## 2023-05-19 NOTE — Unmapped (Signed)
Internal Medicine (MEDU) Progress Note    Assessment & Plan:   Julie [name] is a 72 y.o. F with history of ESRD s/p DDKT 07/2022, recurrent MDR E. coli UTIs who presented with fever, fatigue, and chills likely due to UTI.    Principal Problem:    UTI (urinary tract infection)  Active Problems:    Type 2 diabetes mellitus with diabetic nephropathy, with long-term current use of insulin (CMS-HCC)    Hypothyroidism, acquired    Kidney transplant recipient    Immunosuppressed status (CMS-HCC)    Primary hypertension    Mixed hyperlipidemia    Chronic idiopathic thrombocytopenia (CMS-HCC)    AKI (acute kidney injury) (CMS-HCC)    Hyponatremia     Fever I Chills I Fatigue  History of Recurrent MDR E. Coli UTIs  Patient presented with 3 day history of fever, chills, and 3 episodes of NBNB emesis in ED. ED work-up notable for WBC 12.8, Plt 100, BUN 35, Cr 1.44. UA with large LE, >182 WBC, 8 RBC, moderate bacteria. Patient has had multiple MDR E. coli UTIs in the past, which which were susceptible to ertapenem. Infectious workup notable for Bcx2 (10/24) NGTD, RPP negative, CXR without consolidation, CMV and EBV PCR negative.   Afebrile and resolution of leukocytosis  Antimicrobial therapy  Ertapenem 1 g daily (10/25-) for probable 2-4 week course  Needs line placement for outpatient antibiotics once [facility] NG 48 hours  Continue twice weekly vaginal estrogen at discharge  Appreciate pharmacy's help in approval for methenamine hippurate  F/u urine culture and CTAP to evaluate for perinephric fluid collection  Isolation precautions  Appreciate ICID's help in management of patient    Perinephric Fluid Collection  [place] identified perinephric collection 10/05/22 (8.1 x 0.8 x 2.3 cm) s/p IR guided drainage with negative culture and Cr. 1.5. Subsequent CTAP (7.4 x 1.0 x 5.4 cm) crescentic fluid collection within the soft tissues lateral to the transplant kidney that was unable to be aspirated with VIR. CTAP (04/05/23) with resolution of perinephric fluid collection.  F/u CTAP without contrast    AKI on DDRT 07/2022  Baseline Cr 1-1.3. Peak Cr 2.32 during admission likely due to prerenal AKI from decreased oral intake and infection. She was managed with 1L IVF and started on oral rehydration protocol. Renal transplant [place] (10/24) with patent vasculature and improved resistive indices. Workup notable for UPCR 1.  Oral rehydration protocol ordered  Immunosuppression  Betalacept 5mg   q2 weeks (last dose 10/17)  Prednisone 5mg  daily  Held MMF  Sodium bicarbonate 650mg  TID  Held home chlorthalidone  Appreciate transplant nephrology's help    Other Conditions  HTN: amlodipine 5mg , coreg 25mg  BID, hydralazine 100mg  TID  HLD: lipitor 10mg   IDDM: lantus 5 units nightly, lispro SSI  Hypothyroidism: synthroid daily  GERD: protonix 40mg  daily  Murmur, elevated proBNP: F/u echocardiogram    Daily Checklist:  Diet: Regular Diet  DVT PPx: Heparin 5000units q8h  Electrolytes: Replete Potassium to >/= 3.6 and Magnesium to >/= 1.8  Code Status: Full Code  Dispo:  Inpatient    Team Contact Information:   Primary Team: Internal Medicine (MEDU)  Primary Resident: Hiram Gash, MD, MD  Resident's Pager: [phone] (Gen MedW Intern - Alvester Morin)    Interval History:     Julie [name] looked much improved this morning. AAOx4 and improved fever curve and leukocytosis. She was complaining about nausea but has not vomited this morning.     Objective:   Temp:  [37.1 ??  C (98.8 ??F)-39.3 ??C (102.7 ??F)] 37.3 ??C (99.1 ??F)  Heart Rate:  [70-90] 73  Resp:  [20-21] 20  BP: (144-161)/(31-54) 161/47  SpO2:  [92 %-97 %] 97 %    Gen: NAD, converses   HENT: atraumatic, normocephalic  Heart: 3/6 holosystolic murmur present throughout all fields, loudest at apex.  Normal rate, regular rhythm.  Lungs: normal respiratory effort on RA, CTAB, no crackles or wheezes  Abdomen: soft, NTND, no CVA tenderness  Extremities: Trace edema

## 2023-05-19 NOTE — Unmapped (Signed)
Patient remained alert oriented X 4.On room air, Vitals stable. No complains of pain during the shift. Medications given as per order. Antibiotics given as per order. Blood glucose checked and insulin coverage given as per order. Had a BM during the Shift. Family at bedside. Plan of care continuous.     Problem: Adult Inpatient Plan of Care  Goal: Plan of Care Review  Outcome: Progressing  Flowsheets (Taken 05/19/2023 0437)  Progress: improving  Plan of Care Reviewed With: patient  Goal: Patient-Specific Goal (Individualized)  Outcome: Progressing  Goal: Absence of Hospital-Acquired Illness or Injury  Outcome: Progressing  Intervention: Identify and Manage Fall Risk  Recent Flowsheet Documentation  Taken 05/19/2023 0007 by Concha Norway, RN  Safety Interventions: low bed  Taken 05/18/2023 1925 by Concha Norway, RN  Safety Interventions:   low bed   bed alarm  Intervention: Prevent Infection  Recent Flowsheet Documentation  Taken 05/18/2023 1925 by Concha Norway, RN  Infection Prevention: equipment surfaces disinfected  Goal: Optimal Comfort and Wellbeing  Outcome: Progressing  Goal: Readiness for Transition of Care  Outcome: Progressing  Goal: Rounds/Family Conference  Outcome: Progressing     Problem: Fall Injury Risk  Goal: Absence of Fall and Fall-Related Injury  Outcome: Progressing  Intervention: Promote Injury-Free Environment  Recent Flowsheet Documentation  Taken 05/19/2023 0007 by Concha Norway, RN  Safety Interventions: low bed  Taken 05/18/2023 1925 by Concha Norway, RN  Safety Interventions:   low bed   bed alarm     Problem: Infection  Goal: Absence of Infection Signs and Symptoms  Outcome: Progressing  Intervention: Prevent or Manage Infection  Recent Flowsheet Documentation  Taken 05/18/2023 1925 by Concha Norway, RN  Infection Management: aseptic technique maintained  Isolation Precautions: contact precautions maintained     Problem: Comorbidity Management  Goal: Blood Glucose Levels Within Targeted Range  Outcome: Progressing  Goal: Blood Pressure in Desired Range  Outcome: Progressing     Problem: Infection  Goal: Absence of Infection Signs and Symptoms  Outcome: Progressing  Intervention: Prevent or Manage Infection  Recent Flowsheet Documentation  Taken 05/18/2023 1925 by Concha Norway, RN  Infection Management: aseptic technique maintained  Isolation Precautions: contact precautions maintained

## 2023-05-19 NOTE — Unmapped (Signed)
Pt seen per a Virtual Admissions request placed by the primary RN.  Pts admission was completed with the assistance of her spouse at the bedside.  An education assessment and initial education were completed.   Pt advised to use the call bell if they have questions, requests or needs.  Falls education was added to their discharge AVS.  Pt denies having home medications at bedside.  A password was declined at this time and a HCDM and emergency contacts were  confirmed during the admissions questionnaire.   Pt has been vaccinated this flu season.  Pt's expected discharge date is documented as 05/21/23.    The most recent VS recorded for the Pt are noted below:  BP 148/40  - Pulse 70  - Temp 37.2 ??C (99 ??F) (Oral)  - Resp 20  - Ht 152.4 cm (5')  - Wt 58.8 kg (129 lb 10.1 oz)  - SpO2 97%  - BMI 25.32 kg/m??     The Pt did not complain of pain and denies any needs at this time.

## 2023-05-19 NOTE — Unmapped (Signed)
Social Work  Psychosocial Assessment    Patient Name: Julie [name]   Medical Record Number: [id number]   Date of Birth: 1951/06/23  Sex: Female     Referral  Referred by: Care Manager  Reason for Referral: Complex Discharge Planning  No Psychosocial Interventions Necessary: No Psychosocial Interventions Necessary    Extended Emergency Contact Information  Primary Emergency Contact: Julie [name] [place] of [place]  Mobile Phone: [phone]  Relation: Spouse  Secondary Emergency Contact: [name],Julie  Mobile Phone: [phone]  Relation: Daughter    Legal Next of Kin / Guardian / POA / Advance Directives    HCDM (patient stated preference) (Active): [name],Julie - Spouse - [phone]    Advance Directive (Medical Treatment)  Does patient have an advance directive covering medical treatment?: Patient does not have advance directive covering medical treatment.  Reason patient does not have an advance directive covering medical treatment:: Patient does not wish to complete one at this time.    Health Care Decision Maker [HCDM] (Medical & Mental Health Treatment)  Healthcare Decision Maker: HCDM documented in the HCDM/Contact Info section. ([name],Julie Spouse   [phone])  Information offered on HCDM, Medical & Mental Health advance directives:: Patient declined information.    Advance Directive (Mental Health Treatment)  Does patient have an advance directive covering mental health treatment?: Patient does not have advance directive covering mental health treatment.  Reason patient does not have an advance directive covering mental health treatment:: Patient does not wish to complete one at this time.    Discharge Planning  Discharge Planning Information:   Type of Residence   Mailing Address:  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)     since July 2017    Hypertension     Hypothyroidism (acquired)     Kidney transplant status, cadaveric 08/08/2022    Type 2 diabetes mellitus (CMS-HCC)        Past Surgical History:   Procedure Laterality Date    CATARACT EXTRACTION      HYSTERECTOMY      OOPHORECTOMY      PERITONEAL CATHETER INSERTION      PR LAP INSERTION TUNNELED INTRAPERITONEAL CATHETER N/A 09/27/2018    Procedure: LAPAROSCOPY, SURGICAL; WITH INSERTION OF INTRAPERITONEAL CANNULA OR CATHETER, PERMANENT;  Surgeon: Julie Carry, MD;  Location: MAIN OR [place];  Service: Transplant    PR LAP INSERTION TUNNELED INTRAPERITONEAL CATHETER N/A 04/08/2019    Procedure: LAPAROSCOPY, SURGICAL; WITH INSERTION OF INTRAPERITONEAL CANNULA OR CATHETER, PERMANENT;  Surgeon: Julie Carry, MD;  Location: MAIN OR [facility];  Service: Transplant    PR LAP REVISE INTRAPERITONEAL CATHETER N/A 08/26/2019    Procedure: LAPAROSCOPY, SURGICAL; W/REVIS PREV PLACED INTRAPERITONEAL CANNULA/CATH, REMOV INTRALUMIN OBSTRUCT MATERIAL;  Surgeon: Julie Carry, MD;  Location: MAIN OR [facility];  Service: Transplant    PR REDUCE VOLVULUS,INTUSS,INTERN HERNIA N/A 10/13/2022    Procedure: REDUCTION OF VOLVULUS, INTUSSUSCEPTION, INTERNAL HERNIA, BY LAPAROTOMY;  Surgeon: Julie Broad, MD;  Location: MAIN OR [place];  Service: Trauma    PR REMOVAL TUNNELED INTRAPERITONEAL CATHETER N/A 05/06/2018    Procedure: REMOVAL OF  PERMANENT INTRAPERITONEAL CANNULA OR CATHETER;  Surgeon: Julie Carry, MD;  Location: MAIN OR [facility];  Service: Transplant    PR REMOVE PERITONEAL FOREIGN BODY N/A 04/08/2019    Procedure: Removal Of Peritoneal Of Foreign Body From Peritoneal Cavity;  Surgeon: Julie Carry, MD;  Location: MAIN OR [facility];  Service: Transplant    PR TRANSPLANT,PREP RENAL GRAFT/ARTERIAL Right 08/06/2022    Procedure: [facility] RECONSTRUCTION CADAVER/LIVING DONOR RENAL ALLOGRAFT PRIOR TO TRANSPLANT; ARTERIAL ANASTOMOSIS EAC;  Surgeon: [name], Julie Punt, MD;  Location: MAIN OR [facility];  Service: Transplant    PR TRANSPLANTATION OF KIDNEY Right 08/06/2022    Procedure: RENAL ALLOTRANSPLANTATION, IMPLANTATION OF GRAFT; WITHOUT RECIPIENT NEPHRECTOMY;  Surgeon: [name], Julie Punt, MD;  Location: MAIN OR [facility];  Service: Transplant    TUBAL LIGATION         Family History   Problem Relation Age of Onset    Diabetes Mother     Alzheimer's disease Father     Diabetes Sister     Diabetes Sister     Diabetes Sister     Anesthesia problems Neg Hx     Bleeding Disorder Neg Hx        Financial Information   Primary Insurance: Payor: HUMANA MEDICARE ADV / Plan: HUMANA GOLD PLUS HMO / Product Type: *No Product type* /    Secondary Insurance: None   Prescription Coverage: Medicare D     Preferred Pharmacy: [id number] - [place], [place] - [address]  [facility]  [facility]  [facility]    Barriers to taking medication: No    Transition Home   Transportation at time of discharge: Family/Friend's Private Vehicle    Anticipated changes related to Illness:  TBD   Services in place prior to admission: N/A   Services anticipated for DC:  TBD   Hemodialysis Prior to Admission: No    Readmission  Risk of Unplanned Readmission Score: UNPLANNED READMISSION SCORE: 42.27%  Readmitted Within the Last 30 Days?   Readmission Factors include: other: w/in 1 year of txp    Social Determinants of Health  Social Determinants of Health were addressed in provider documentation.  Please refer to patient history.    Social History  Support Systems/Concerns: Spouse, Financial trader: No Field seismologist Affecting Healthcare: none    Medical and Psychiatric History  Psychosocial Stressors: Coping with health challenges/recent hospitalization      Psychological Issues/Information: Comment           Comment: hx of AMS when ill/infected  Chemical Dependency: None              Outpatient Providers: Specialist   Name / Contact #: : [facility]  Legal: No legal issues      Ability to [employer]: No issues accessing community services      Pt/family well known to this CSW 2/2 post transplant care.  Will continue to follow for all DC planning needs.      Eliezer Bottom, LCSW

## 2023-05-20 LAB — COMPREHENSIVE METABOLIC PANEL
ALBUMIN: 3 g/dL — ABNORMAL LOW (ref 3.4–5.0)
ALKALINE PHOSPHATASE: 64 U/L (ref 46–116)
ALT (SGPT): 11 U/L (ref 10–49)
ANION GAP: 8 mmol/L (ref 5–14)
AST (SGOT): 11 U/L (ref <=34)
BILIRUBIN TOTAL: 0.3 mg/dL (ref 0.3–1.2)
BLOOD UREA NITROGEN: 36 mg/dL — ABNORMAL HIGH (ref 9–23)
BUN / CREAT RATIO: 22
CALCIUM: 8.3 mg/dL — ABNORMAL LOW (ref 8.7–10.4)
CHLORIDE: 104 mmol/L (ref 98–107)
CO2: 22 mmol/L (ref 20.0–31.0)
CREATININE: 1.66 mg/dL — ABNORMAL HIGH
EGFR CKD-EPI (2021) FEMALE: 32 mL/min/{1.73_m2} — ABNORMAL LOW (ref >=60)
GLUCOSE RANDOM: 235 mg/dL — ABNORMAL HIGH (ref 70–179)
POTASSIUM: 3.8 mmol/L (ref 3.4–4.8)
PROTEIN TOTAL: 6.2 g/dL (ref 5.7–8.2)
SODIUM: 134 mmol/L — ABNORMAL LOW (ref 135–145)

## 2023-05-20 LAB — URINALYSIS WITH MICROSCOPY
BILIRUBIN UA: NEGATIVE
BLOOD UA: NEGATIVE
GLUCOSE UA: NEGATIVE
KETONES UA: NEGATIVE
NITRITE UA: NEGATIVE
PH UA: 5.5 (ref 5.0–9.0)
PROTEIN UA: NEGATIVE
RBC UA: 1 /HPF (ref <=4)
SPECIFIC GRAVITY UA: 1.007 (ref 1.003–1.030)
SQUAMOUS EPITHELIAL: 7 /HPF — ABNORMAL HIGH (ref 0–5)
UROBILINOGEN UA: <2
WBC UA: 61 /HPF — ABNORMAL HIGH (ref 0–5)

## 2023-05-20 LAB — CBC
HEMATOCRIT: 27 % — ABNORMAL LOW (ref 34.0–44.0)
HEMOGLOBIN: 9.6 g/dL — ABNORMAL LOW (ref 11.3–14.9)
MEAN CORPUSCULAR HEMOGLOBIN CONC: 35.5 g/dL (ref 32.0–36.0)
MEAN CORPUSCULAR HEMOGLOBIN: 32.3 pg (ref 25.9–32.4)
MEAN CORPUSCULAR VOLUME: 91.1 fL (ref 77.6–95.7)
MEAN PLATELET VOLUME: 8.5 fL (ref 6.8–10.7)
PLATELET COUNT: 99 10*9/L — ABNORMAL LOW (ref 150–450)
RED BLOOD CELL COUNT: 2.96 10*12/L — ABNORMAL LOW (ref 3.95–5.13)
RED CELL DISTRIBUTION WIDTH: 14 % (ref 12.2–15.2)
WBC ADJUSTED: 6.6 10*9/L (ref 3.6–11.2)

## 2023-05-20 LAB — MAGNESIUM: MAGNESIUM: 2.3 mg/dL (ref 1.6–2.6)

## 2023-05-20 MED ADMIN — carvedilol (COREG) tablet 25 mg: 25 mg | ORAL | @ 12:00:00

## 2023-05-20 MED ADMIN — heparin (porcine) 5,000 unit/mL injection 5,000 Units: 5000 [IU] | SUBCUTANEOUS | @ 01:00:00

## 2023-05-20 MED ADMIN — sodium bicarbonate tablet 650 mg: 650 mg | ORAL | @ 01:00:00

## 2023-05-20 MED ADMIN — amlodipine (NORVASC) tablet 5 mg: 5 mg | ORAL | @ 12:00:00

## 2023-05-20 MED ADMIN — hydrALAZINE (APRESOLINE) tablet 100 mg: 100 mg | ORAL | @ 12:00:00

## 2023-05-20 MED ADMIN — carvedilol (COREG) tablet 25 mg: 25 mg | ORAL | @ 01:00:00

## 2023-05-20 MED ADMIN — heparin (porcine) 5,000 unit/mL injection 5,000 Units: 5000 [IU] | SUBCUTANEOUS | @ 10:00:00

## 2023-05-20 MED ADMIN — insulin lispro (HumaLOG) injection 0-20 Units: 0-20 [IU] | SUBCUTANEOUS | @ 02:00:00

## 2023-05-20 MED ADMIN — melatonin tablet 6 mg: 6 mg | ORAL | @ 01:00:00

## 2023-05-20 MED ADMIN — sodium bicarbonate tablet 650 mg: 650 mg | ORAL | @ 12:00:00

## 2023-05-20 MED ADMIN — levothyroxine (SYNTHROID) tablet 112 mcg: 112 ug | ORAL | @ 10:00:00

## 2023-05-20 MED ADMIN — insulin lispro (HumaLOG) injection 0-20 Units: 0-20 [IU] | SUBCUTANEOUS | @ 16:00:00

## 2023-05-20 MED ADMIN — pantoprazole (Protonix) EC tablet 40 mg: 40 mg | ORAL | @ 12:00:00

## 2023-05-20 MED ADMIN — hydrALAZINE (APRESOLINE) tablet 100 mg: 100 mg | ORAL | @ 17:00:00

## 2023-05-20 MED ADMIN — sodium bicarbonate tablet 650 mg: 650 mg | ORAL | @ 17:00:00

## 2023-05-20 MED ADMIN — heparin (porcine) 5,000 unit/mL injection 5,000 Units: 5000 [IU] | SUBCUTANEOUS | @ 17:00:00

## 2023-05-20 MED ADMIN — atorvastatin (LIPITOR) tablet 10 mg: 10 mg | ORAL | @ 12:00:00

## 2023-05-20 MED ADMIN — meropenem (MERREM) 500 mg in sodium chloride 0.9 % (NS) 100 mL IVPB-MBP: 500 mg | INTRAVENOUS | @ 17:00:00 | Stop: 2023-06-02

## 2023-05-20 MED ADMIN — predniSONE (DELTASONE) tablet 5 mg: 5 mg | ORAL | @ 12:00:00

## 2023-05-20 MED ADMIN — ertapenem (INVANZ) 500 mg in sodium chloride (NS) 0.9 % 50 mL IVPB: 500 mg | INTRAVENOUS | @ 01:00:00 | Stop: 2023-05-24

## 2023-05-20 MED ADMIN — amlodipine (NORVASC) tablet 5 mg: 5 mg | ORAL | @ 01:00:00

## 2023-05-20 MED ADMIN — insulin glargine (LANTUS) injection 5 Units: 5 [IU] | SUBCUTANEOUS | @ 02:00:00

## 2023-05-20 MED ADMIN — cholecalciferol (vitamin D3 25 mcg (1,000 units)) tablet 50 mcg: 50 ug | ORAL | @ 12:00:00

## 2023-05-20 MED ADMIN — hydrALAZINE (APRESOLINE) tablet 100 mg: 100 mg | ORAL | @ 01:00:00

## 2023-05-20 MED ADMIN — insulin lispro (HumaLOG) injection 0-20 Units: 0-20 [IU] | SUBCUTANEOUS | @ 21:00:00

## 2023-05-20 NOTE — Unmapped (Signed)
VAT assessed pt in room to see if it's appropriate to discharge pt with advance care at home from PIV stand point. Pt only has one working PIV R forearm close to wrist at this time. R hand and R forearm has multiple bruises. Veins are very small. Left arm has AV fistula. VAT does not recommend discharge pt home with advance care at home if she still need IV ABX. Pt has VIR tunneled line order.        Workup / Procedure Time:  30 minutes       Care RN was notified.       Thank you,     Gillie Manners, RN Venous Access Team

## 2023-05-20 NOTE — Unmapped (Signed)
Pt alert and oriented x 4.Up ad lib in room.Falls precautions maintained.PT/OT following.Husband at bedside.No complaints of pain or nausea this shift.Appetite much better today.Blood glucose fluctuates.Remains on correctional insulin.Pt on meropenum.Afebrile this shift.Updated on plan of care by MD.No further needs voiced.VSS.Call bell within reach.plan of care continues.  Problem: Adult Inpatient Plan of Care  Goal: Plan of Care Review  Outcome: Progressing  Flowsheets (Taken 05/20/2023 1231)  Progress: improving  Plan of Care Reviewed With: patient  Goal: Patient-Specific Goal (Individualized)  Outcome: Progressing  Flowsheets (Taken 05/20/2023 1231)  Patient/Family-Specific Goals (Include Timeframe): pt will remain afebrile during this shift 7a-7p  Individualized Care Needs: falls,achs,assist with meals,PT/OT,iv abx  Anxieties, Fears or Concerns: none voiced  Goal: Absence of Hospital-Acquired Illness or Injury  Outcome: Progressing  Intervention: Identify and Manage Fall Risk  Recent Flowsheet Documentation  Taken 05/20/2023 1200 by Ileene Rubens, RN  Safety Interventions: fall reduction program maintained  Taken 05/20/2023 1000 by Ileene Rubens, RN  Safety Interventions: fall reduction program maintained  Taken 05/20/2023 0730 by Ileene Rubens, RN  Safety Interventions: fall reduction program maintained  Intervention: Prevent and Manage VTE (Venous Thromboembolism) Risk  Recent Flowsheet Documentation  Taken 05/20/2023 1200 by Ileene Rubens, RN  Anti-Embolism Intervention: (heparin sub q) Other (Comment)  Taken 05/20/2023 1000 by Ileene Rubens, RN  Anti-Embolism Intervention: (heparin sub q) Other (Comment)  Taken 05/20/2023 0730 by Ileene Rubens, RN  Anti-Embolism Intervention: (heparin sub q) Other (Comment)  Goal: Optimal Comfort and Wellbeing  Outcome: Progressing  Goal: Readiness for Transition of Care  Outcome: Progressing  Goal: Rounds/Family Conference  Outcome: Progressing  Flowsheets (Taken 05/20/2023 1231)  Participants:   patient   physician   nursing

## 2023-05-20 NOTE — Unmapped (Cosign Needed)
[facility]  DIVISION OF INTERVENTIONAL RADIOLOGY CONSULTATION       IR Consultation Note         HPI:  73 y.o. female with history of ESRD s/p DDKT 07/2022, recurrent MDR E. coli UTIs who presented with fever, fatigue, and chills. Patient found to have recurrent UTI requiring IV abx. We are consulted at the request of the primary team for tunneled SL powerline for long term abx at dc. No pending cultures. Patient has a history of multiple bilateral lines. Last tunneled line placed on the left due to thrombosis of the right IJ.     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)     since July 2017    Hypertension     Hypothyroidism (acquired)     Kidney transplant status, cadaveric 08/08/2022    Type 2 diabetes mellitus (CMS-HCC)        PAST SURGICAL HISTORY:  Past Surgical History:   Procedure Laterality Date    CATARACT EXTRACTION      HYSTERECTOMY      OOPHORECTOMY      PERITONEAL CATHETER INSERTION      PR LAP INSERTION TUNNELED INTRAPERITONEAL CATHETER N/A 09/27/2018    Procedure: LAPAROSCOPY, SURGICAL; WITH INSERTION OF INTRAPERITONEAL CANNULA OR CATHETER, PERMANENT;  Surgeon: Leona Carry, MD;  Location: MAIN OR [place];  Service: Transplant    PR LAP INSERTION TUNNELED INTRAPERITONEAL CATHETER N/A 04/08/2019    Procedure: LAPAROSCOPY, SURGICAL; WITH INSERTION OF INTRAPERITONEAL CANNULA OR CATHETER, PERMANENT;  Surgeon: Leona Carry, MD;  Location: MAIN OR [place];  Service: Transplant    PR LAP REVISE INTRAPERITONEAL CATHETER N/A 08/26/2019    Procedure: LAPAROSCOPY, SURGICAL; W/REVIS PREV PLACED INTRAPERITONEAL CANNULA/CATH, REMOV INTRALUMIN OBSTRUCT MATERIAL;  Surgeon: Leona Carry, MD;  Location: MAIN OR [facility];  Service: Transplant    PR REDUCE VOLVULUS,INTUSS,INTERN HERNIA N/A 10/13/2022    Procedure: REDUCTION OF VOLVULUS, INTUSSUSCEPTION, INTERNAL HERNIA, BY LAPAROTOMY;  Surgeon: Suella Broad, MD;  Location: MAIN OR [place];  Service: Trauma    PR REMOVAL TUNNELED INTRAPERITONEAL CATHETER N/A 05/06/2018    Procedure: REMOVAL OF PERMANENT INTRAPERITONEAL CANNULA OR CATHETER;  Surgeon: Leona Carry, MD;  Location: MAIN OR [facility];  Service: Transplant    PR REMOVE PERITONEAL FOREIGN BODY N/A 04/08/2019    Procedure: Removal Of Peritoneal Of Foreign Body From Peritoneal Cavity;  Surgeon: Leona Carry, MD;  Location: MAIN OR [facility];  Service: Transplant    PR TRANSPLANT,PREP RENAL GRAFT/ARTERIAL Right 08/06/2022    Procedure: [facility] RECONSTRUCTION CADAVER/LIVING DONOR RENAL ALLOGRAFT PRIOR TO TRANSPLANT; ARTERIAL ANASTOMOSIS EAC;  Surgeon: Toledo, Lilyan Punt, MD;  [facility];  Service: Transplant    PR TRANSPLANTATION OF KIDNEY Right 08/06/2022    Procedure: RENAL ALLOTRANSPLANTATION, IMPLANTATION OF GRAFT; WITHOUT RECIPIENT NEPHRECTOMY;  Surgeon: Toledo, Lilyan Punt, MD;  Location: MAIN OR [facility];  Service: Transplant    TUBAL LIGATION         SOCIAL HISTORY:  Social History     Socioeconomic History    Marital status: Married     Spouse name: Tamiracle Dianna    Number of children: 2   Occupational History  Occupation: retired   Tobacco Use    Smoking status: Former     Current packs/day: 0.00     Average packs/day: 0.8 packs/day for 30.0 years (22.5 ttl pk-yrs)     Types: Cigarettes     Start date: 06/06/1976     Quit date: 06/06/2006     Years since quitting: 16.9    Smokeless tobacco: Former   Substance and Sexual Activity    Alcohol use: Yes     Comment: only special occasions-a glass of wine sometimes    Drug use: No   Social History Narrative    Lives in [place] with her husband. Moved from [place] to the [place] when she was around 19.     Social Determinants of Health     Financial Resource Strain: Low Risk  (03/12/2023)    Received from [facility]    Overall Financial Resource Strain (CARDIA)     Difficulty of Paying Living Expenses: Not hard at all   Food Insecurity: No Food Insecurity (03/12/2023)    Received from [facility]    Hunger Vital Sign     Worried About Running Out of Food in the Last Year: Never true     Ran Out of Food in the Last Year: Never true   Transportation Needs: No Transportation Needs (03/12/2023)    Received from [facility] - Transportation     In the past 12 months, has lack of transportation kept you from medical appointments or from getting medications?: No     Lack of Transportation (Non-Medical): No   Social Connections: Unknown (04/06/2018)    Received from [facility]    Social Connection and Isolation Panel [NHANES]     Marital Status: Married       MEDICATIONS:     Current Facility-Administered Medications:     acetaminophen (TYLENOL) tablet 1,000 mg, 1,000 mg, Oral, Q8H PRN, Allman, Eric C, ANP, 1,000 mg at 05/18/23 1625    amlodipine (NORVASC) tablet 5 mg, 5 mg, Oral, Q12H, Adam, Safiyya A, MD, 5 mg at 05/20/23 0813    atorvastatin (LIPITOR) tablet 10 mg, 10 mg, Oral, Daily, Adam, Safiyya A, MD, 10 mg at 05/20/23 0813    carvedilol (COREG) tablet 25 mg, 25 mg, Oral, BID, Adam, Safiyya A, MD, 25 mg at 05/20/23 0813    cholecalciferol (vitamin D3 25 mcg (1,000 units)) tablet 50 mcg, 50 mcg, Oral, Daily, Adam, Safiyya A, MD, 50 mcg at 05/20/23 0813    dextrose (D10W) 10% bolus 125 mL, 12.5 g, Intravenous, Q10 Min PRN, Adam, Safiyya A, MD    glucagon injection 1 mg, 1 mg, Intramuscular, Once PRN, Madelaine Bhat, Safiyya A, MD    glucose chewable tablet 16 g, 16 g, Oral, Q10 Min PRN, Adam, Safiyya A, MD    heparin (porcine) 5,000 unit/mL injection 5,000 Units, 5,000 Units, Subcutaneous, Q8H SCH, Adam, Safiyya A, MD, 5,000 Units at 05/20/23 1326    hydrALAZINE (APRESOLINE) tablet 100 mg, 100 mg, Oral, TID, Adam, Safiyya A, MD, 100 mg at 05/20/23 1326    insulin glargine (LANTUS) injection 5 Units, 5 Units, Subcutaneous, Nightly, Adam, Safiyya A, MD, 5 Units at 05/19/23 2208    insulin lispro (HumaLOG) injection 0-20 Units, 0-20 Units, Subcutaneous, ACHS, Madilyn Hook A, MD, 2 Units at 05/20/23 1209    levothyroxine (SYNTHROID) tablet 112 mcg, 112 mcg, Oral, daily, Madilyn Hook A, MD, 112 mcg at 05/20/23  5409    melatonin tablet 6 mg, 6 mg, Oral, Nightly, Adam, Safiyya A, MD, 6 mg at 05/19/23 2107    meropenem (MERREM) 500 mg in sodium chloride 0.9 % (NS) 100 mL IVPB-MBP, 500 mg, Intravenous, Q12H, Hatfield, Jess, MD, Last Rate: 200 mL/hr at 05/20/23 1326, 500 mg at 05/20/23 1326    [Provider Hold] mycophenolate (CELLCEPT) capsule 250 mg, 250 mg, Oral, BID, Adam, Safiyya A, MD    ondansetron (ZOFRAN) injection 4 mg, 4 mg, Intravenous, Q8H PRN, Madilyn Hook A, MD, 4 mg at 05/19/23 0847    pantoprazole (Protonix) EC tablet 40 mg, 40 mg, Oral, Daily, Adam, Safiyya A, MD, 40 mg at 05/20/23 0813    predniSONE (DELTASONE) tablet 5 mg, 5 mg, Oral, Daily, Adam, Safiyya A, MD, 5 mg at 05/20/23 0813    sodium bicarbonate tablet 650 mg, 650 mg, Oral, TID, Madilyn Hook A, MD, 650 mg at 05/20/23 1326    ALLERGIES:  Allergies   Allergen Reactions    Gabapentin Other (See Comments)     Shakes    Valacyclovir Nausea Only and Other (See Comments)         PHYSICAL EXAM:  Vitals:    05/20/23 0800   BP: 145/46   Pulse: 66   Resp: 18   Temp: 36.9 ??C (98.4 ??F)   SpO2: 100%       ASA Grade: ASA 3 - Patient with moderate systemic disease with functional limitations      Airway assessment: Class 3 - Can visualize soft palate     Pertinent Labs:     WBC   Date Value Ref Range Status   05/20/2023 6.6 3.6 - 11.2 10*9/L Final   03/27/2023 5.2 3.4 - 10.8 x10E3/uL Final     HGB   Date Value Ref Range Status   05/20/2023 9.6 (L) 11.3 - 14.9 g/dL Final   81/19/1478 29.5 11.1 - 15.9 g/dL Final     Hemoglobin   Date Value Ref Range Status   08/21/2022 9.2 (L) 12.0 - 16.0 g/dL Final     HCT   Date Value Ref Range Status   05/20/2023 27.0 (L) 34.0 - 44.0 % Final   03/27/2023 33.7 (L) 34.0 - 46.6 % Final     Platelet   Date Value Ref Range Status   05/20/2023 99 (L) 150 - 450 10*9/L Final   03/27/2023 109 (L) 150 - 450 x10E3/uL Final     INR   Date Value Ref Range Status   02/06/2023 1.01  Final     Creatinine   Date Value Ref Range Status   05/20/2023 1.66 (H) 0.55 - 1.02 mg/dL Final   62/13/0865 7.84 (H) 0.57 - 1.00 mg/dL Final       Imaging: Fluoro images on 01/31/23 were reviewed         Anticoaguation: Yes  If yes, type of anticoagulation Heparin SQ    ASSESSMENT & PLAN:    72 y.o.female with recurrent e. Coli UTI requiring tunneled SL PL for abx. Patient has a hsitory of multiple bilateral line placements. Last placement with thrombosed IJ on the right. Will require a pre-scan.     -Anticipated procedure date:  10/28-10/30  -NPO the night prior to procedure  -Ensure recent CBC, chem, pt-inr    Informed Consent:  This procedure has been fully reviewed with the patient/patient???s authorized representative. The risks, benefits and alternatives have been explained, and the patient/patient???s authorized representative has consented to the procedure.  --  The patient will accept blood products in an emergent situation.  --The patient does not have a Do Not Resuscitate order in effect.      Thank you for involving [place] in the care of this patient. Please page the VIR consult pager [phone]) with further questions, concerns, or if new issues arise.  Jake Bathe, MD, May 20, 2023, 1:43 PM

## 2023-05-20 NOTE — Unmapped (Signed)
Patient remained alert oriented X 4. On room air. Vitals stable. No complains of pain during the shift. Medications given as per order. Blood glucose checked and insulin coverage given as per order. Family at bedside. Plan of care continuous.     Problem: Adult Inpatient Plan of Care  Goal: Plan of Care Review  Outcome: Progressing  Flowsheets (Taken 05/20/2023 0511)  Progress: improving  Plan of Care Reviewed With: patient  Goal: Patient-Specific Goal (Individualized)  Outcome: Progressing  Goal: Absence of Hospital-Acquired Illness or Injury  Outcome: Progressing  Intervention: Identify and Manage Fall Risk  Recent Flowsheet Documentation  Taken 05/19/2023 1925 by Concha Norway, RN  Safety Interventions:   bed alarm   low bed   fall reduction program maintained  Intervention: Prevent and Manage VTE (Venous Thromboembolism) Risk  Recent Flowsheet Documentation  Taken 05/20/2023 0400 by Concha Norway, RN  Anti-Embolism Intervention: (Heparin SQ) Other (Comment)  Taken 05/20/2023 0200 by Concha Norway, RN  Anti-Embolism Intervention: (Heparin SQ) Other (Comment)  Taken 05/20/2023 0003 by Concha Norway, RN  Anti-Embolism Intervention: (Heparin SQ) Other (Comment)  Taken 05/19/2023 2300 by Concha Norway, RN  Anti-Embolism Intervention: Other (Comment)  Taken 05/19/2023 2200 by Concha Norway, RN  Anti-Embolism Intervention: (Heparin SQ) Other (Comment)  Taken 05/19/2023 2000 by Concha Norway, RN  Anti-Embolism Intervention: (Heparin SQ) Other (Comment)  Taken 05/19/2023 1925 by Concha Norway, RN  Anti-Embolism Intervention: (Heparin SQ) Other (Comment)  Intervention: Prevent Infection  Recent Flowsheet Documentation  Taken 05/19/2023 1925 by Concha Norway, RN  Infection Prevention:   personal protective equipment utilized   hand hygiene promoted  Goal: Optimal Comfort and Wellbeing  Outcome: Progressing  Goal: Readiness for Transition of Care  Outcome: Progressing  Goal: Rounds/Family Conference  Outcome: Progressing     Problem: Fall Injury Risk  Goal: Absence of Fall and Fall-Related Injury  Outcome: Progressing  Intervention: Promote Injury-Free Environment  Recent Flowsheet Documentation  Taken 05/19/2023 1925 by Concha Norway, RN  Safety Interventions:   bed alarm   low bed   fall reduction program maintained     Problem: Infection  Goal: Absence of Infection Signs and Symptoms  Outcome: Progressing  Intervention: Prevent or Manage Infection  Recent Flowsheet Documentation  Taken 05/19/2023 1925 by Concha Norway, RN  Infection Management: aseptic technique maintained  Isolation Precautions: contact precautions maintained     Problem: Comorbidity Management  Goal: Blood Glucose Levels Within Targeted Range  Outcome: Progressing  Goal: Blood Pressure in Desired Range  Outcome: Progressing     Problem: Infection  Goal: Absence of Infection Signs and Symptoms  Outcome: Progressing  Intervention: Prevent or Manage Infection  Recent Flowsheet Documentation  Taken 05/19/2023 1925 by Concha Norway, RN  Infection Management: aseptic technique maintained  Isolation Precautions: contact precautions maintained

## 2023-05-20 NOTE — Unmapped (Signed)
IMMUNOCOMPROMISED HOST INFECTIOUS DISEASE PROGRESS NOTE    Assessment/Plan:     Julie [name] is a 72 y.o. 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 Had perinephric drain in place postoperatively, removed 08/30/22  - Immunosuppression: Belatacept 5 mg/kg every 2 weeks (last dose 10/17), MMF 250 mg BID, & prednisone 5 mg daily   - Estimated Creatinine Clearance: 17.9 mL/min (A) (based on SCr of 2.32 mg/dL (H)).     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 Coli - s/p Levo x 3 weeks (thru stent removal, 3/15) (S: Cefazolin, Cipro/Levo, Pip-tazo, Cephalexin; R: TMP-SMX, Amp, Amp-sulb)  - 10/05/22 BCx/UCx ESBL/FQ-R E. Coli (S: Erta, Gent, Mero, Tobra, Cefepime SDD; I: ceftaz; R: Cipro/Levo, Cefazolin, Ceftriaxone, Amp) s/p 2w Meropenem; 10/10/22 IR guided drainage negative cx, Cr 1.5 not suggestive of urinoma   - 11/04/22 Bcx/Ucx E. Coli (R CFZ, CTX, cipro/levo, I ceftaz, SDD CFP, susc nitro) s/p 2w Meropenem  - 11/28/22 seen by urogyn, no structural issues, added vaginal estrogen/d-mannose  - 12/25/22 UCx <10,000 E. Coli (S: Cefepime SDD; Erta/Mero, Gent, Nitrofurantoin, Tobra) s/p ertapenem until 01/15/23  - nitrofurantoin prophylaxis (4/29-6/2) not restarted due to reduced GFR and breakthrough. Fosfomycin cost-prohibitive  - 01/27/23 - UCx <1,000 CFU E. Coli, BCx (NG), CTAP: 7.4 x 1.0 x 5.4 cm crescentic fluid collection within the soft tissues lateral to the transplant kidney (VIR unable to aspirate) treated with ertapenem until CT resolution of collection on 04/05/23 imaging     Active infection:  # Fever likely related to UTI 05/17/23  - completed ertapenem mid-Sept after resolution of fluid collection on 04/05/23 imaging  - 10/21 Flu/COVID vaccine  - Febrile 10/22 PM with chills vomiting, myalgias, altered mentation. No urgency or dysuria prodrome.  - 10/24 UA: >182 WBC, 6 RBC, bacteria. Ucx: 10-50K E. Coli and 10/50k E. Faecalis.   - 10/24 Bcx pending  - 10/26: CT A/P without fluid collections     Rx 10/24 Vanc/Cefepime -> 10/25 Ertapenem->10/27 meropenem      Antimicrobial allergy/intolerance:   Penicillin (see allergy list for nuances)  Valacyclovir - nausea         RECOMMENDATIONS  Significantly improved since starting ertapenem. Ucx now growing E. Faecalis in  addition to her usual E. Coli. Hard to determine the clinical significance of the E. Faecalis as she has been improving drastically on ertapenem which is a suboptimal treatment for E. Faecalis. However, she did get a dose of vancomycin in the ED so may have improved because of that. As such, to avoid relapse would switch her back to meropenem while inpatient. Will repeat ucx and if E. Faecalis grows again will devise outpatient antibiotic regimen that will cover both organisms. CT A/P without any fluid collections.    Diagnosis  Repeat UA and Ucx to see if E faecilis is still detected  F/u E coli and E faecalis AST    Management  Stop ertapenem and start meropenem 500mg  q12H (renally dosed)  Plan for COPAT, appreciate VIR assistance with line placement    Prophylaxis required for transplant immunosuppression and rUTI    Continue twice weekly vaginal estrogen for UTI prevention - apply to vaginal and urethral introitus  Anticipate starting methenamine or Ellura on completion of IV antibiotic course for secondary prevention    Intensive toxicity monitoring for prescription antimicrobials   CBC w/diff at least once per week  CMP at least once per week  clinical assessments for rashes or other skin changes    The [facility] ID service will continue to follow.           Please page the ID Transplant/Liquid Oncology Fellow consult at [phone] with questions.  Patient discussed with Dr. Reynold Bowen.    Henriette Combs, MD  [facility] Division of Infectious Diseases    Subjective:     External record(s): Primary team note: planning to have VIR place line for OPAT .    Independent historian(s): no independent historian required.       Interval History:   Much improved mentation today. No longer somnolent. Very interactive with conversation. Denies any acute complaints. Feels back to her baseline and ready to leave.    Medications:  Current Medications as of 05/20/2023  Scheduled  PRN   amlodipine, 5 mg, Q12H  atorvastatin, 10 mg, Daily  carvedilol, 25 mg, BID  cholecalciferol (vitamin D3 25 mcg (1,000 units)), 50 mcg, Daily  heparin (porcine) for subcutaneous use, 5,000 Units, Q8H SCH  hydrALAZINE, 100 mg, TID  insulin glargine, 5 Units, Nightly  insulin lispro, 0-20 Units, ACHS  levothyroxine, 112 mcg, daily  melatonin, 6 mg, Nightly  meropenem, 500 mg, Q12H  [Provider Hold] mycophenolate, 250 mg, BID  pantoprazole, 40 mg, Daily  predniSONE, 5 mg, Daily  sodium bicarbonate, 650 mg, TID      acetaminophen, 1,000 mg, Q8H PRN  dextrose in water, 12.5 g, Q10 Min PRN  glucagon, 1 mg, Once PRN  glucose, 16 g, Q10 Min PRN  ondansetron, 4 mg, Q8H PRN         Objective:     Vital Signs last 24 hours:  Temp:  [36.9 ??C (98.4 ??F)] 36.9 ??C (98.4 ??F)  Heart Rate:  [65-66] 66  Resp:  [18] 18  BP: (145-154)/(45-46) 145/46  MAP (mmHg):  [75-78] 75  SpO2:  [100 %] 100 %    Physical Exam:   Patient Lines/Drains/Airways Status       Active Active Lines, Drains, & Airways       Name Placement date Placement time Site Days    Peripheral IV 05/17/23 Right Wrist 05/17/23  1536  Wrist  3    Arteriovenous Fistula - Vein Graft  Access 08/24/16 Left;Upper Arm 08/24/16  --  Arm  2460                  Const [x]  vital signs above    []  NAD, non-toxic appearance []  Chronically ill-appearing, non-distressed        Eyes [x]  Lids normal bilaterally, conjunctiva anicteric and noninjected OU     [] PERRL  [] EOMI        ENMT [x]  Normal appearance of external nose and ears, no nasal discharge        []  MMM, no lesions on lips or gums []  No thrush, leukoplakia, oral lesions  []  Dentition good []  Edentulous []  Dental caries present  []  Hearing normal  []  TMs with good light reflexes bilaterally         Neck [x]  Neck of normal appearance and trachea midline        []  No thyromegaly, nodules, or tenderness   []  Full neck ROM        Lymph []  No LAD in neck     []  No LAD in supraclavicular area     []  No LAD in axillae   []  No LAD in epitrochlear chains     []  No LAD in inguinal areas        CV [x]  RRR            []  No peripheral edema     []  Pedal pulses intact   [x]  No abnormal heart sounds appreciated   []  Extremities WWP         Resp [x]  Normal WOB at rest    []  No breathlessness with speaking, no coughing  [x]  CTA anteriorly    []  CTA posteriorly          GI [x]  Normal inspection, NTND   []  NABS     []  No umbilical hernia on exam       []  No hepatosplenomegaly     []  Inspection of perineal and perianal areas normal        GU []  Normal external genitalia     [] No urinary catheter present in urethra   []  No CVA tenderness    []  No tenderness over renal allograft        MSK []  No clubbing or cyanosis of hands       []  No vertebral point tenderness  []  No focal tenderness or abnormalities on palpation of joints in RUE, LUE, RLE, or LLE        Skin []  No rashes, lesions, or ulcers of visualized skin     []  Skin warm and dry to palpation         Neuro []  Face expression symmetric  []  Sensation to light touch grossly intact throughout    []  Moves extremities equally    []  No tremor noted        [x]  CNs II-XII grossly intact     []  DTRs normal and symmetric throughout []  Gait unremarkable        Psych [x]  Appropriate affect       []  Fluent speech         []  Attentive, good eye contact  []  Oriented to person, place, time          []  Judgment and insight are appropriate           Data for Medical Decision Making         I discussed mgm't w/qualified health care professional(s) involved in case: primary team .    I reviewed  CBC results (WBC downtrending), chemistry results (Cr improving to 1.9), micro result(s) (growth of E. Faecalis on Ucx), and radiology report(s) (no intra-abdominal fluid collections).    I independently visualized/interpreted CT images (no obvious fluid collections in the abdomen or pelvis).       Recent Labs     Units 05/19/23  0925 05/20/23  1103   WBC 10*9/L 8.2 6.6   HGB g/dL 9.4* 9.6*   PLT 29*5/A 82* 99*   NEUTROABS 10*9/L 7.3  --    LYMPHSABS 10*9/L 0.4*  --    EOSABS 10*9/L 0.0  --    BUN mg/dL 46* 36*   CREATININE mg/dL 2.13* 0.86*   AST U/L 11 11   ALT U/L 12 11   BILITOT mg/dL 0.5 0.3   ALKPHOS U/L 67 64   K mmol/L 3.4 3.8   MG mg/dL  --  2.3   CALCIUM mg/dL 8.2* 8.3*       Microbiology:  Microbiology Results (last day)       Procedure Component Value Date/Time Date/Time    Blood Culture [[id number]]  (Normal) Collected: 05/17/23 1511    Lab Status: Preliminary result Specimen: Blood from 1 Peripheral Draw Updated: 05/20/23 1545     Blood Culture, Routine No Growth at 72 hours    Urine Culture [[id number]]  (Abnormal)  (Susceptibility) Collected: 05/17/23 1957    Lab Status: Preliminary result Specimen: Urine from Clean Catch Updated: 05/20/23 1428     Urine Culture, Comprehensive 10,000 to 50,000 CFU/mL Escherichia coli      10,000 to 50,000 CFU/mL Enterococcus faecalis    Narrative:      Specimen Source: Clean Catch    Susceptibility       Escherichia coli (1)       Antibiotic Interpretation Microscan Method Status    Ampicillin Resistant  MIC SUSCEPTIBILITY RESULT Preliminary    Aztreonam Resistant  MIC SUSCEPTIBILITY RESULT Preliminary    Cefazolin Resistant  MIC SUSCEPTIBILITY RESULT Preliminary    Cephalexin Resistant  MIC SUSCEPTIBILITY RESULT Preliminary     For uncomplicated UTI's only.       Cefepime Susceptible-Dose Dependent  MIC SUSCEPTIBILITY RESULT Preliminary     Based on 2g every 8h with extended infusion in adult patients.       Ceftazidime Intermediate  MIC SUSCEPTIBILITY RESULT Preliminary    Ceftriaxone Resistant  MIC SUSCEPTIBILITY RESULT Preliminary    Ciprofloxacin Resistant  MIC SUSCEPTIBILITY RESULT Preliminary    Ertapenem Susceptible  MIC SUSCEPTIBILITY RESULT Preliminary    Gentamicin Susceptible  MIC SUSCEPTIBILITY RESULT Preliminary    Levofloxacin Intermediate  MIC SUSCEPTIBILITY RESULT Preliminary    Meropenem Susceptible  MIC SUSCEPTIBILITY RESULT Preliminary    Nitrofurantoin Susceptible  MIC SUSCEPTIBILITY RESULT Preliminary    Tetracycline Resistant  MIC SUSCEPTIBILITY RESULT Preliminary     Organisms that test susceptible to tetracycline are considered susceptible to doxycycline.However, some organisms that test intermediate or resistant to tetracycline may be susceptible to doxycycline.       Tobramycin Susceptible  MIC SUSCEPTIBILITY RESULT Preliminary    Trimethoprim + Sulfamethoxazole Resistant  MIC SUSCEPTIBILITY RESULT Preliminary                       Urine Culture [[id number]] Collected: 05/20/23 1032    Lab Status: In process Specimen: Urine from Clean Catch Updated: 05/20/23 1048    Blood Culture [[id number]]  (Normal) Collected: 05/17/23 1532    Lab Status: Preliminary result Specimen: Blood from 1 Peripheral  Draw Updated: 05/19/23 1700     Blood Culture, Routine No Growth at 48 hours            Imaging:  Echocardiogram W Colorflow Spectral Doppler    Result Date: 05/20/2023  Patient Info Name:     Julie [name] Age:     71 years DOB:     1950/08/22 Gender:     Female MRN:     [id number] Accession #:     [id number] UN Account #:     [id number] Ht:     152 cm Wt:     59 kg BSA:     1.59 m2 BP:     145 /     46 mmHg HR:     67 bpm Heart Rhythm:     Sinus Rhythm Exam Date:     05/20/2023 9:49 AM Admit Date:     05/17/2023     Exam Type:     ECHOCARDIOGRAM W COLORFLOW SPECTRAL DOPPLER     Technical Quality:     Fair     Staff Sonographer:     Sedonia Small Rodgers-Jones     Study Info Indications      - Pulmonary edema and peripheral edema,  elevated proBNP Procedure(s)   Complete two-dimensional, color flow and Doppler transthoracic echocardiogram is performed.         Summary   1. The left ventricle is normal in size with upper normal wall thickness.   2. The left ventricular systolic function is normal, LVEF is visually estimated at > 55%.   3. The right ventricle is normal in size, with normal systolic function.   4. There is grade II diastolic dysfunction (elevated filling pressure).   5. There is mild to moderate aortic valve stenosis.   6. There is mild pulmonary hypertension.         Left Ventricle   The left ventricle is normal in size with upper normal wall thickness. The left ventricular systolic function is normal, LVEF is visually estimated at > 55%. There is grade II diastolic dysfunction (elevated filling pressure).     Right Ventricle   The right ventricle is normal in size, with normal systolic function.         Left Atrium   The left atrium is normal in size.     Right Atrium   The right atrium is normal in size.         Aortic Valve   The aortic valve is trileaflet with mildly thickened leaflets with mildly reduced excursion. There is no significant aortic regurgitation. There is mild to moderate aortic valve stenosis. Peak AV transvalvular velocity:  2.6 m/s. Mean gradient: 17 mmHg. Doppler velocity index: 0.39. Estimated aortic valve area (VTI): 1.2 cm2. Estimated aortic valve area (velocity): 1.2 cm2. LVOT diameter:  2.0 cm. LVOT stroke volume index: 50 ml/m2.     Mitral Valve   The mitral valve leaflets are mildly thickened with normal leaflet mobility. There is no significant mitral valve regurgitation.     Tricuspid Valve   The tricuspid valve leaflets are normal, with normal leaflet mobility. There is mild tricuspid regurgitation. There is mild pulmonary hypertension. TR maximum velocity: 3.2 m/s  Estimated PASP: 45 mmHg.     Pulmonic Valve   The pulmonic valve is poorly visualized, but probably normal. There is no significant pulmonic regurgitation. There is no evidence of a significant transvalvular gradient.         Aorta  The aorta is normal in size in the visualized segments.     Inferior Vena Cava   IVC size and inspiratory change suggest normal right atrial pressure. (0-5 mmHg).     Pericardium/Pleural   There is no pericardial effusion.         Ventricles ---------------------------------------------------------------------- Name                                 Value        Normal ----------------------------------------------------------------------     LV Dimensions 2D/MM ----------------------------------------------------------------------  IVS Diastolic Thickness (2D)                                1.1 cm       0.6-0.9 LVID Diastole (2D)                  4.1 cm       3.8-5.2  LVPW Diastolic Thickness (2D)                                1.1 cm       0.6-0.9 LVID Systole (2D) 2.5 cm       2.2-3.5 LVOT Diameter                       2.0 cm               LV Mass Index (2D Cubed)           95 g/m2         43-95  Relative Wall Thickness (2D)                                  0.54                   RV Dimensions 2D/MM ----------------------------------------------------------------------  RV Basal Diastolic Dimension                           4.1 cm       2.5-4.1 TAPSE                               3.2 cm         >=1.7     Atria ---------------------------------------------------------------------- Name                                 Value        Normal ----------------------------------------------------------------------     LA Dimensions ---------------------------------------------------------------------- LA Dimension (2D)                   4.9 cm       2.7-3.8 LA Volume Index (4C A-L)        33.51 ml/m2               LA Volume Index (2C A-L)        27.89 ml/m2               LA Volume (BP MOD)  45 ml               LA Volume Index (BP MOD)        28.40 ml/m2   16.00-34.00     RA Dimensions ---------------------------------------------------------------------- RA Area (4C)                      16.0 cm2        <=18.0 RA Area (4C) Index              10.1 cm2/m2               RA ESV Index (4C MOD)             27 ml/m2         15-27     Left Ventricular Outflow Tract ---------------------------------------------------------------------- Name                                 Value        Normal ----------------------------------------------------------------------     LVOT 2D ---------------------------------------------------------------------- LVOT Diameter                       2.0 cm               LVOT Area                          3.1 cm2                   LVOT Doppler ---------------------------------------------------------------------- LVOT Peak Velocity                 1.0 m/s               LVOT VTI                             25 cm               LVOT Stroke Volume                   79 ml               LVOT SI                           50 ml/m2     Aortic Valve ---------------------------------------------------------------------- Name                                 Value        Normal ----------------------------------------------------------------------     AV Doppler ---------------------------------------------------------------------- AV Peak Velocity                   2.6 m/s               AV Peak Gradient                   27 mmHg               AV Mean Gradient                   17 mmHg  AV VTI                               68 cm               AV Area (Cont Eq VTI)              1.2 cm2         >=3.0 AV Area Index (Cont Eq VTI)     0.7 cm2/m2               AV Area (Cont Eq Vel)              1.2 cm2               AV Area Index (Cont Eq Vel)     0.8 cm2/m2               AV DI (Vel)                           0.39     Mitral Valve ---------------------------------------------------------------------- Name                                 Value        Normal ----------------------------------------------------------------------     MV Diastolic Function ---------------------------------------------------------------------- MV E Peak Velocity                119 cm/s               MV A Peak Velocity                109 cm/s               MV E/A                                 1.1                   MV Annular TDI ---------------------------------------------------------------------- MV Septal e' Velocity             6.3 cm/s         >=8.0 MV E/e' (Septal)                      18.9               MV Lateral e' Velocity            9.9 cm/s        >=10.0 MV E/e' (Lateral)                     12.0               MV e' Average                     8.1 cm/s               MV E/e' (Average)                     15.4     Tricuspid Valve ---------------------------------------------------------------------- Name  Value        Normal ----------------------------------------------------------------------     TV Regurgitation Doppler ---------------------------------------------------------------------- TR Peak Velocity                   3.2 m/s                   Estimated PAP/RSVP ---------------------------------------------------------------------- RA Pressure                         3 mmHg           <=5 RV Systolic Pressure               45 mmHg           <36     Pulmonic Valve ---------------------------------------------------------------------- Name                                 Value        Normal ----------------------------------------------------------------------     PV Doppler ---------------------------------------------------------------------- PV Peak Velocity                   1.1 m/s     Aorta ---------------------------------------------------------------------- Name                                 Value        Normal ----------------------------------------------------------------------     Ascending Aorta ---------------------------------------------------------------------- Ao Root Diameter (2D)               2.6 cm               Ao Root Diam Index (2D)          1.6 cm/m2         Report Signatures Finalized by Glynn Octave  MD on 05/20/2023 12:44 PM    CT Abdomen Pelvis Wo Contrast    Result Date: 05/19/2023  EXAM: CT ABDOMEN PELVIS WO CONTRAST ACCESSION: [id number] UN CLINICAL INDICATION: 72 years old with complicated UTI      COMPARISON: CT abdomen pelvis 04/05/2023     TECHNIQUE: A spiral CT scan was obtained without IV contrast from the lung bases to the pubic symphysis.  Images were reconstructed in the axial plane. Coronal and sagittal reformatted images were also provided for further evaluation.     Evaluation of the solid organs and vasculature is limited in the absence of intravenous contrast.     FINDINGS:     LOWER CHEST: Degraded by motion. Bibasilar subsegmental atelectasis. Coronary artery calcifications. Mild cardiomegaly again noted.     LIVER: Normal liver contour. Subcentimeter hepatic hypodensities, too small to characterize prior.     BILIARY: The gallbladder is contracted limiting evaluation. No intrahepatic biliary ductal dilatation.     SPLEEN: Normal in size and contour. Stable splenic cyst (2:23).     PANCREAS: Normal pancreatic contour without signs of inflammation or gross ductal dilatation.     ADRENAL GLANDS: Normal appearance of the adrenal glands.     KIDNEYS/URETERS: The native kidneys are atrophied with multiple bilateral renal calculi and vascular calcifications, similar to prior. 0.3 cm calculus in the left proximal ureter is identified (2:77), unchanged.     Left lower quadrant renal allograft without evidence of collecting system dilation or perinephric fluid collection.     BLADDER: Two punctate calcific densities are identified  in the right bladder measuring up to 0.3 cm, likely calculi which may be within the bladder or right distal ureter at the UVJ, unchanged.     REPRODUCTIVE ORGANS: Uterus surgically absent.     GI TRACT: No findings of bowel obstruction or acute inflammation.  Normal appendix. Colonic diverticulosis without evidence of diverticulitis.     PERITONEUM/RETROPERITONEUM AND MESENTERY: No free air. No ascites. No fluid collection.     VASCULATURE: Normal caliber aorta with severe atherosclerotic calcifications. Otherwise, limited evaluation without contrast.     LYMPH NODES: No adenopathy.     BONES and SOFT TISSUES: No aggressive osseous lesions. Mild degenerative changes of the spine. Stable sclerotic lesions of the right acetabulum favored to be bone islands. Anterior abdominal wall fat stranding and subcutaneous emphysema consistent with medication administration.         -Left lower quadrant renal allograft without evidence of collecting system dilation or perinephric fluid. -Bilateral renal calculi and left proximal renal calculus, unchanged. Bladder calculi or right distal ureteral calculi at the UVJ, unchanged. Additional chronic incidental findings as above.         US Renal Transplant W Doppler    Result Date: 05/18/2023  EXAM: US RENAL Glenna Durand ACCESSION: [id number] UN     CLINICAL INDICATION: 72 years old with AKI, c/f UTI; eval fluid collection     COMPARISON: 01/28/23     TECHNIQUE:  Ultrasound views of the renal transplant were obtained using gray scale and color and spectral Doppler imaging. Views of the urinary bladder were obtained using gray scale and limited color Doppler imaging.     FINDINGS:     TRANSPLANTED KIDNEY: The renal transplant was located in the left lower quadrant. Normal size and echogenicity.  No solid masses or calculi. Simple left renal cyst measuring 1.1 x 1.2 x 0.9 cm. No perinephric collections identified. No hydronephrosis.     VESSELS: - Perfusion: Using power Doppler, normal perfusion was seen throughout the renal parenchyma. - Resistive indices in the renal transplant are slightly decreased compared with prior examination.     Please see below for data measurements: Transplant location: LLQ     Renal Transplant: Sagittal 12.2 cm; AP 5.1 cm; Transverse 6.4 cm     Segmental artery superior resistive index: 0.77 Segmental artery mid resistive index: 0.80 Segmental artery inferior resistive index: 0.75     Previous resistive indices range of segmental arteries: 0.85-0.86     Main renal artery peak systolic velocity at anastomosis: 115.45 cm/s Main renal artery hilum resistive index: 0.81 Main renal artery mid resistive index: 0.82 Main renal artery anastomosis resistive index: 0.83     Previous resistive indices range of main renal artery: 0.75-0.90     Main renal vein: Patent     Iliac artery: Patent Iliac vein: Patent     Bladder volume prevoid: 46.8  mL     - Main renal artery/iliac artery: Patent - Main renal vein/iliac vein: Patent     BLADDER: Decompressed, limiting evaluation.         1. Patent transplant vasculature. Improved resistive indices in the renal transplant arteries, at the upper limits of normal. 2. No visualized fluid collection is noted. 3. Please note, ultrasound is not sensitive for the evaluation of pyelonephritis.

## 2023-05-20 NOTE — Unmapped (Signed)
Internal Medicine (MEDU) Progress Note    Assessment & Plan:   Julie [name] is a 72 y.o. F with history of ESRD s/p DDKT 07/2022, recurrent MDR E. coli UTIs who presented with fever, fatigue, and chills likely due to UTI.    Principal Problem:    UTI (urinary tract infection)  Active Problems:    Type 2 diabetes mellitus with diabetic nephropathy, with long-term current use of insulin (CMS-HCC)    Hypothyroidism, acquired    Kidney transplant recipient    Immunosuppressed status (CMS-HCC)    Primary hypertension    Mixed hyperlipidemia    Chronic idiopathic thrombocytopenia (CMS-HCC)    AKI (acute kidney injury) (CMS-HCC)    Hyponatremia     10/27: Urine cultures resulted for E. coli and E faecalis.  CT with no perinephric fluid, overall unchanged.  Plan to redraw urine cultures, then switch to meropenem.  Will place tunneled line with VIR for home antibiotics.    Urinary tract infection  History of Recurrent MDR E. Coli UTIs  Patient presented with 3 day history of fever, chills, and 3 episodes of NBNB emesis in ED. ED work-up notable for WBC 12.8, Plt 100, BUN 35, Cr 1.44. UA with large LE, >182 WBC, 8 RBC, moderate bacteria. Patient has had multiple MDR E. coli UTIs in the past, which which were susceptible to ertapenem. Infectious workup notable for Bcx2 (10/24) NGTD, RPP negative, CXR without consolidation, CMV and EBV PCR negative.  10/24 urine culture positive for E. coli and E faecalis.  - Afebrile and resolution of leukocytosis  - Antimicrobial therapy  - Ertapenem 1 g daily (10/25-10/26).  Meropenem, renally dosed (10/27-)  - Consulted VIR for tunneled line  - Continue twice weekly vaginal estrogen at discharge  - Appreciate pharmacy's help in approval for methenamine hippurate   -Follow-up 10/24 urine culture sensitivities, repeat 10/27 urine culture and urinalysis.  - Isolation precautions  - Appreciate ICID's help in management of patient    Perinephric Fluid Collection  [place] identified perinephric collection 10/05/22 (8.1 x 0.8 x 2.3 cm) s/p IR guided drainage with negative culture and Cr. 1.5. Subsequent CTAP (7.4 x 1.0 x 5.4 cm) crescentic fluid collection within the soft tissues lateral to the transplant kidney that was unable to be aspirated with VIR. CTAP (04/05/23) with resolution of perinephric fluid collection.  CTAP 10/25 without dilation or perinephric fluid, overall unchanged.  -Workup/treatment of UTI as above.    AKI on DDRT 07/2022  Baseline Cr 1-1.3. Peak Cr 2.32 during admission likely due to prerenal AKI from decreased oral intake and infection. She was managed with 1L IVF and started on oral rehydration protocol. Renal transplant [place] (10/24) with patent vasculature and improved resistive indices. Workup notable for UPCR 1.  Improving.  -Oral rehydration protocol ordered  -Immunosuppression  - Betalacept 5mg   q2 weeks (last dose 10/17)  - Prednisone 5mg  daily  - Held MMF  - Sodium bicarbonate 650mg  TID  - Held home chlorthalidone  - Appreciate transplant nephrology's help    Other Conditions  - HTN: amlodipine 5mg , coreg 25mg  BID, hydralazine 100mg  TID  - HLD: lipitor 10mg   - IDDM: lantus 5 units nightly, lispro SSI  - Hypothyroidism: synthroid daily  - GERD: protonix 40mg  daily  - Murmur, elevated proBNP: F/u echocardiogram    Daily Checklist:  Diet: Regular Diet  DVT PPx: Heparin 5000units q8h  Electrolytes: Replete Potassium to >/= 3.6 and Magnesium to >/= 1.8  Code Status: Full Code  Dispo:  Inpatient    Team Contact Information:   Primary Team: Internal Medicine (MEDU)  Primary Resident: Percival Spanish, MD, MD  Resident's Pager: [phone] (Gen Med[facility] Intern - Alvester Morin)    Interval History:     Appeared well this morning.  Asymptomatic.  Asking about going home.    Objective:   Temp:  [36.9 ??C (98.4 ??F)-37.1 ??C (98.8 ??F)] 36.9 ??C (98.4 ??F)  Heart Rate:  [65-66] 66  Resp:  [18] 18  BP: (140-154)/(42-46) 145/46  SpO2:  [95 %-100 %] 100 %    Gen: NAD, converses   HENT: atraumatic, normocephalic  Heart: 3/6 holosystolic murmur present throughout all fields, loudest at apex.  Normal rate, regular rhythm.  Lungs: normal respiratory effort on RA, CTAB, no crackles or wheezes  Abdomen: soft, NTND, no CVA tenderness  Extremities: Trace edema

## 2023-05-21 LAB — BASIC METABOLIC PANEL
ANION GAP: 11 mmol/L (ref 5–14)
BLOOD UREA NITROGEN: 31 mg/dL — ABNORMAL HIGH (ref 9–23)
BUN / CREAT RATIO: 19
CALCIUM: 8.6 mg/dL — ABNORMAL LOW (ref 8.7–10.4)
CHLORIDE: 103 mmol/L (ref 98–107)
CO2: 25 mmol/L (ref 20.0–31.0)
CREATININE: 1.59 mg/dL — ABNORMAL HIGH
EGFR CKD-EPI (2021) FEMALE: 34 mL/min/{1.73_m2} — ABNORMAL LOW (ref >=60)
GLUCOSE RANDOM: 169 mg/dL (ref 70–179)
POTASSIUM: 3.8 mmol/L (ref 3.4–4.8)
SODIUM: 139 mmol/L (ref 135–145)

## 2023-05-21 LAB — CBC
HEMATOCRIT: 29.2 % — ABNORMAL LOW (ref 34.0–44.0)
HEMOGLOBIN: 10.2 g/dL — ABNORMAL LOW (ref 11.3–14.9)
MEAN CORPUSCULAR HEMOGLOBIN CONC: 34.9 g/dL (ref 32.0–36.0)
MEAN CORPUSCULAR HEMOGLOBIN: 32.2 pg (ref 25.9–32.4)
MEAN CORPUSCULAR VOLUME: 92.1 fL (ref 77.6–95.7)
MEAN PLATELET VOLUME: 8 fL (ref 6.8–10.7)
PLATELET COUNT: 109 10*9/L — ABNORMAL LOW (ref 150–450)
RED BLOOD CELL COUNT: 3.17 10*12/L — ABNORMAL LOW (ref 3.95–5.13)
RED CELL DISTRIBUTION WIDTH: 13.8 % (ref 12.2–15.2)
WBC ADJUSTED: 5.4 10*9/L (ref 3.6–11.2)

## 2023-05-21 LAB — HLA DS POST TRANSPLANT
ANTI-DONOR DRW #1 MFI: 99 MFI
ANTI-DONOR DRW #2 MFI: 99 MFI
ANTI-DONOR HLA-A #1 MFI: 0 MFI
ANTI-DONOR HLA-A #2 MFI: 0 MFI
ANTI-DONOR HLA-B #1 MFI: 0 MFI
ANTI-DONOR HLA-B #2 MFI: 0 MFI
ANTI-DONOR HLA-C #1 MFI: 0 MFI
ANTI-DONOR HLA-DQB #1 MFI: 84 MFI
ANTI-DONOR HLA-DQB #2 MFI: 84 MFI
ANTI-DONOR HLA-DR #1 MFI: 0 MFI
ANTI-DONOR HLA-DR #2 MFI: 0 MFI

## 2023-05-21 LAB — FSAB CLASS 2 ANTIBODY SPECIFICITY: HLA CL2 AB RESULT: POSITIVE

## 2023-05-21 LAB — FSAB CLASS 1 ANTIBODY SPECIFICITY: HLA CLASS 1 ANTIBODY RESULT: NEGATIVE

## 2023-05-21 LAB — MAGNESIUM: MAGNESIUM: 2.1 mg/dL (ref 1.6–2.6)

## 2023-05-21 MED ORDER — METHENAMINE HIPPURATE 1 GRAM TABLET
ORAL_TABLET | Freq: Two times a day (BID) | ORAL | 0 refills | 30 days
Start: 2023-05-21 — End: 2023-05-21

## 2023-05-21 MED ADMIN — carvedilol (COREG) tablet 25 mg: 25 mg | ORAL | @ 01:00:00

## 2023-05-21 MED ADMIN — amlodipine (NORVASC) tablet 5 mg: 5 mg | ORAL | @ 12:00:00

## 2023-05-21 MED ADMIN — heparin (porcine) 5,000 unit/mL injection 5,000 Units: 5000 [IU] | SUBCUTANEOUS | @ 10:00:00

## 2023-05-21 MED ADMIN — insulin lispro (HumaLOG) injection 2 Units: 2 [IU] | SUBCUTANEOUS | @ 12:00:00

## 2023-05-21 MED ADMIN — cholecalciferol (vitamin D3 25 mcg (1,000 units)) tablet 50 mcg: 50 ug | ORAL | @ 12:00:00

## 2023-05-21 MED ADMIN — insulin glargine (LANTUS) injection 5 Units: 5 [IU] | SUBCUTANEOUS | @ 02:00:00

## 2023-05-21 MED ADMIN — levothyroxine (SYNTHROID) tablet 112 mcg: 112 ug | ORAL | @ 10:00:00

## 2023-05-21 MED ADMIN — melatonin tablet 6 mg: 6 mg | ORAL | @ 01:00:00

## 2023-05-21 MED ADMIN — amlodipine (NORVASC) tablet 5 mg: 5 mg | ORAL | @ 01:00:00

## 2023-05-21 MED ADMIN — hydrALAZINE (APRESOLINE) tablet 100 mg: 100 mg | ORAL | @ 17:00:00

## 2023-05-21 MED ADMIN — atorvastatin (LIPITOR) tablet 10 mg: 10 mg | ORAL | @ 12:00:00

## 2023-05-21 MED ADMIN — predniSONE (DELTASONE) tablet 5 mg: 5 mg | ORAL | @ 12:00:00

## 2023-05-21 MED ADMIN — pantoprazole (Protonix) EC tablet 40 mg: 40 mg | ORAL | @ 12:00:00

## 2023-05-21 MED ADMIN — meropenem (MERREM) 500 mg in sodium chloride 0.9 % (NS) 100 mL IVPB-MBP: 500 mg | INTRAVENOUS | @ 04:00:00 | Stop: 2023-05-21

## 2023-05-21 MED ADMIN — heparin (porcine) 5,000 unit/mL injection 5,000 Units: 5000 [IU] | SUBCUTANEOUS | @ 17:00:00

## 2023-05-21 MED ADMIN — insulin lispro (HumaLOG) injection 2 Units: 2 [IU] | SUBCUTANEOUS | @ 20:00:00

## 2023-05-21 MED ADMIN — meropenem (MERREM) 500 mg in sodium chloride 0.9 % (NS) 100 mL IVPB-MBP: 500 mg | INTRAVENOUS | @ 17:00:00 | Stop: 2023-05-21

## 2023-05-21 MED ADMIN — insulin lispro (HumaLOG) injection 0-20 Units: 0-20 [IU] | SUBCUTANEOUS | @ 01:00:00

## 2023-05-21 MED ADMIN — carvedilol (COREG) tablet 25 mg: 25 mg | ORAL | @ 12:00:00

## 2023-05-21 MED ADMIN — sodium bicarbonate tablet 650 mg: 650 mg | ORAL | @ 17:00:00

## 2023-05-21 MED ADMIN — sodium bicarbonate tablet 650 mg: 650 mg | ORAL | @ 12:00:00

## 2023-05-21 MED ADMIN — hydrALAZINE (APRESOLINE) tablet 100 mg: 100 mg | ORAL | @ 01:00:00

## 2023-05-21 MED ADMIN — sodium bicarbonate tablet 650 mg: 650 mg | ORAL | @ 01:00:00

## 2023-05-21 MED ADMIN — hydrALAZINE (APRESOLINE) tablet 100 mg: 100 mg | ORAL | @ 12:00:00

## 2023-05-21 MED ADMIN — heparin (porcine) 5,000 unit/mL injection 5,000 Units: 5000 [IU] | SUBCUTANEOUS | @ 01:00:00

## 2023-05-21 NOTE — Unmapped (Signed)
Internal Medicine (MEDU) Progress Note    Assessment & Plan:   Julie [name] is a 72 y.o. F with history of ESRD s/p DDKT 07/2022, recurrent MDR E. coli UTIs who presented with fever, fatigue, and chills likely due to UTI.    Principal Problem:    UTI (urinary tract infection)  Active Problems:    Type 2 diabetes mellitus with diabetic nephropathy, with long-term current use of insulin (CMS-HCC)    Hypothyroidism, acquired    Kidney transplant recipient    Immunosuppressed status (CMS-HCC)    Primary hypertension    Mixed hyperlipidemia    Chronic idiopathic thrombocytopenia (CMS-HCC)    AKI (acute kidney injury) (CMS-HCC)    Hyponatremia     10/28: 10/24 Urine cultures resulted for E. coli (MDR similar to prior) and E faecalis; rpt 10/27 with mixed urogenital flora. TTE with mild/mod aortic stenosis, EF 55%, Grd II diastolic dysfxn. Currently on meropenem. Awaiting abx recs per ID.    Urinary tract infection  History of Recurrent MDR E. Coli UTIs  Patient presented with 3 day history of fever, chills, and 3 episodes of NBNB emesis in ED. ED work-up notable for WBC 12.8, Plt 100, BUN 35, Cr 1.44. UA with large LE, >182 WBC, 8 RBC, moderate bacteria. Patient has had multiple MDR E. coli UTIs in the past, which which were susceptible to ertapenem. Infectious workup notable for Bcx2 (10/24) NGTD, RPP negative, CXR without consolidation, CMV and EBV PCR negative.  10/24 urine culture positive for E. coli (MDR, similar to prior) and E faecalis. Repeat 10/27 urine culture w/ mixed urogenital flora.  - Afebrile and resolution of leukocytosis  - Antimicrobial therapy  - Ertapenem 1 g daily (10/25-10/26).  Meropenem, renally dosed (10/27-)  - Consulted VIR for tunneled line  - Continue twice weekly vaginal estrogen at discharge  - Appreciate pharmacy's help in approval for methenamine hippurate   - Isolation precautions  - Appreciate ICID's help in management of patient    Perinephric Fluid Collection  [place] identified perinephric collection 10/05/22 (8.1 x 0.8 x 2.3 cm) s/p IR guided drainage with negative culture and Cr. 1.5. Subsequent CTAP (7.4 x 1.0 x 5.4 cm) crescentic fluid collection within the soft tissues lateral to the transplant kidney that was unable to be aspirated with VIR. CTAP (04/05/23) with resolution of perinephric fluid collection.  CTAP 10/25 without dilation or perinephric fluid, overall unchanged.  -Workup/treatment of UTI as above.    AKI on DDRT 07/2022  Baseline Cr 1-1.3. Peak Cr 2.32 during admission likely due to prerenal AKI from decreased oral intake and infection. She was managed with 1L IVF and started on oral rehydration protocol. Renal transplant [place] (10/24) with patent vasculature and improved resistive indices. Workup notable for UPCR 1.  Improving.  -Oral rehydration protocol ordered  -Immunosuppression  - Betalacept 5mg   q2 weeks (last dose 10/17)  - Prednisone 5mg  daily  - Held MMF  - Sodium bicarbonate 650mg  TID  - Held home chlorthalidone  - Appreciate transplant nephrology's help    Aortic Stenosis - HFpEF  New diagnosis of HFpEF, currently compensated.  New diagnosis of mild-mod AS. Had a oxygen requirement of 1 L briefly after getting 1 L of fluids for her AKI.  No edema. Pro-BNP ~8,000. Some basilar crackles on exam, no edema. 05/19/2023 TTE with mild/mod aortic stenosis, EF 55%, Grd II diastolic dysfxn/elevated filling pressure.  Previous TTE 10/08/2022 showing no aortic stenosis, grade 1 diastolic dysfunction - thus this new  echo (10.26) does represent a worsening in both aortic valve and diastolic dysfunction.  Patient does have a previously known systolic murmur.  Patient does not endorse symptoms of shortness of breath/orthopnea.  Patient was already on spironolactone at home; this is currently held in the setting of AKI.   -Overall, though this patient seems to have some mild pulmonary edema; we are holding off on diuresis at this time due to AKI which seemed very volume dependent, and she is otherwise euvolemic.  -Monitor volume status/O2 requirements, diurese prn. Avoid IVF if able.  -Restart spironolactone when able  -Holding off on SGLT-2 given UTIs.  -Follow up with cardiology outpatient. Consider Entresto outpatient.    Other Conditions  - HTN: amlodipine 5mg , coreg 25mg  BID, hydralazine 100mg  TID  - HLD: lipitor 10mg   - IDDM: lantus 5 units nightly, lispro SSI  - Hypothyroidism: synthroid daily  - GERD: protonix 40mg  daily    Daily Checklist:  Diet: Regular Diet  DVT PPx: Heparin 5000units q8h  Electrolytes: Replete Potassium to >/= 3.6 and Magnesium to >/= 1.8  Code Status: Full Code  Dispo:  Inpatient    Team Contact Information:   Primary Team: Internal Medicine (MEDU)  Primary Resident: Percival Spanish, MD, MD  Resident's Pager: [phone] (Gen Med[facility] Intern - Alvester Morin)    Interval History:     Appeared well this morning.  Asymptomatic.  Asking about going home.    Objective:   Temp:  [36.8 ??C (98.2 ??F)-37.1 ??C (98.8 ??F)] 37.1 ??C (98.8 ??F)  Heart Rate:  [67-74] 67  Resp:  [18] 18  BP: (157-165)/(44-49) 161/44  SpO2:  [97 %-98 %] 97 %    Gen: NAD, converses   HENT: atraumatic, normocephalic  Heart: 3/6 systolic murmur present throughout all fields, loudest at apex.  Normal rate, regular rhythm.  Lungs: normal respiratory effort on RA, mild crackles present at bases; R>L.  Abdomen: soft, NTND, no CVA tenderness  Extremities: Trace edema

## 2023-05-21 NOTE — Unmapped (Signed)
Patient on room air, alert and responsive with no distress noted.  Tolerated medication with no adverse reactions noted.  Patient denies pain and discomfort at this time.  Bed in lowest position, callbell within reach and continue to monitor for safety.  Family at bedside.    Problem: Adult Inpatient Plan of Care  Goal: Plan of Care Review  Outcome: Progressing  Flowsheets (Taken 05/21/2023 0442)  Progress: improving  Goal: Patient-Specific Goal (Individualized)  Outcome: Progressing  Goal: Absence of Hospital-Acquired Illness or Injury  Outcome: Progressing  Intervention: Identify and Manage Fall Risk  Recent Flowsheet Documentation  Taken 05/20/2023 2000 by Valeta Harms, RN  Safety Interventions:   environmental modification   fall reduction program maintained   isolation precautions   lighting adjusted for tasks/safety   low bed   family at bedside  Intervention: Prevent and Manage VTE (Venous Thromboembolism) Risk  Recent Flowsheet Documentation  Taken 05/21/2023 0400 by Valeta Harms, RN  Anti-Embolism Intervention: (Heparin SQ) Other (Comment)    Goal: Optimal Comfort and Wellbeing  Outcome: Progressing  Goal: Readiness for Transition of Care  Outcome: Progressing  Goal: Rounds/Family Conference  Outcome: Progressing     Problem: Fall Injury Risk  Goal: Absence of Fall and Fall-Related Injury  Outcome: Progressing  Intervention: Promote Scientist, clinical (histocompatibility and immunogenetics) Documentation  Taken 05/20/2023 2000 by Valeta Harms, RN  Safety Interventions:   environmental modification   fall reduction program maintained   isolation precautions   lighting adjusted for tasks/safety   low bed   family at bedside     Problem: Infection  Goal: Absence of Infection Signs and Symptoms  Outcome: Progressing  Intervention: Prevent or Manage Infection  Recent Flowsheet Documentation  Taken 05/20/2023 2000 by Valeta Harms, RN  Isolation Precautions: contact precautions maintained     Problem: Infection  Goal: Absence of Infection Signs and Symptoms  Outcome: Progressing  Intervention: Prevent or Manage Infection  Recent Flowsheet Documentation  Taken 05/20/2023 2000 by Valeta Harms, RN  Isolation Precautions: contact precautions maintained     Problem: Comorbidity Management  Goal: Blood Glucose Levels Within Targeted Range  Outcome: Progressing  Goal: Blood Pressure in Desired Range  Outcome: Progressing

## 2023-05-21 NOTE — Unmapped (Addendum)
PHYSICAL THERAPY  Evaluation (05/21/23 0856)          Patient Name:  Julie [name]       Medical Record Number: [id number]   Date of Birth: 11-May-1951  Sex: Female        Post-Discharge Physical Therapy Recommendations:  PT Post Acute Discharge Recommendations: Skilled PT services indicated, 3x weekly   Equipment Recommendation  PT DME Recommendations: None          Treatment Diagnosis: Generalized muscle weakness        Activity Tolerance: Tolerated treatment well     ASSESSMENT  Problem List: Impaired balance, Gait deviation, Fall risk, Decreased mobility, Decreased endurance, Decreased strength      Assessment : Julie [name] is a 72 y.o. female whose presentation is complicated by ESRD s/p DDKT 07/2022, recurrent MDR UTIs, HTN, T2DM, SBO/ileus, hyperlipidemia, hypothroidism and chronic idiopathic thrombocytopenia that presented to [facility] with fever, fatigue, and chills with UA findings concerning for UTI. Pt tolerated session well. Able to ambulate 46' with no device and assist ranging from SBA to modI. Intermittent CGA for slight instability. Able to navigate 4 steps with R rail and SBA progressing to modI. Alternating gait pattern. No LOB/knee buckling/instability throughout. Per pt, no acute PT concerns and feels comfortable with DC home. Per pt, gait feels normal. Per husband, gait pattern appears to be baseline. Encouraged to continue mobility with staff, sit in the chair throughout the day, and perform therex. DC from acute PT as long as pt continues mobility with staff. At baseline, pt independent without a device. Has 24/7 support from her husband. Pt is an appropriate 3x candidate due to deficits in strength and functional mobility. Continue to progress per pt's tolerance and POC.      Today's Interventions: Endurance activities, Patient/Family/Caregiver Education, Gait training, Therapeutic activity, Therapeutic exercise  Today's Interventions: eval, education (PT role and POC, benefits of mobility/upright, activity pacing/tolerance, RW use/purpose and safety, call don't fall, continued ambulation with staff, DC recommendations), therex (discussed performing throughout the day to tolerance and performed quad set, AP, heel slides)                 Clinical Decision Making: Low     After a review of the personal factors, comorbidities, clinical presentation, and examination of the number of affected body systems, the patient presents as a low complexity case.         PLAN  Planned Frequency of Treatment: Plan of Care Initiated: 05/21/23  D/C Services Weekly Frequency: D/C Services        Planned Interventions:       Goals:   Patient and Family Goals: to improve general strength                                                                                                              Prognosis:  Good  Positive Indicators: PLOF, CLOF, participation, caregiver support  Barriers to Discharge: None     SUBJECTIVE  Communication Preference: Verbal,  Patient reports: Pt agreeable to PT services. Husband present throughout.        Prior Functional Status: Pt independent without a device at baseline. Independent with self-care and ADLs. Enjoys gardening. Drives. Walks every day. Denies recent falls.  Equipment available at home: Rollator, Rolling walker        Past Medical History:   Diagnosis Date    Acute metabolic acidosis 01/29/2023    Acute metabolic encephalopathy 01/29/2023    AKI (acute kidney injury) (CMS-HCC) 01/29/2023    Anemia     Anemia due to stage 5 chronic kidney disease, not on chronic dialysis (CMS-HCC) 08/08/2022    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)     since July 2017    Hypertension     Hypothyroidism (acquired)     Kidney transplant status, cadaveric 08/08/2022    Type 2 diabetes mellitus (CMS-HCC)             Social History     Tobacco Use    Smoking status: Former     Current packs/day: 0.00     Average packs/day: 0.8 packs/day for 30.0 years (22.5 ttl pk-yrs)     Types: Cigarettes     Start date: 06/06/1976     Quit date: 06/06/2006     Years since quitting: 16.9    Smokeless tobacco: Former   Substance Use Topics    Alcohol use: Yes     Comment: only special occasions-a glass of wine sometimes       Past Surgical History:   Procedure Laterality Date    CATARACT EXTRACTION      HYSTERECTOMY      OOPHORECTOMY      PERITONEAL CATHETER INSERTION      PR LAP INSERTION TUNNELED INTRAPERITONEAL CATHETER N/A 09/27/2018    Procedure: LAPAROSCOPY, SURGICAL; WITH INSERTION OF INTRAPERITONEAL CANNULA OR CATHETER, PERMANENT;  Surgeon: Leona Carry, MD;  Location: MAIN OR [place];  Service: Transplant    PR LAP INSERTION TUNNELED INTRAPERITONEAL CATHETER N/A 04/08/2019    Procedure: LAPAROSCOPY, SURGICAL; WITH INSERTION OF INTRAPERITONEAL CANNULA OR CATHETER, PERMANENT;  Surgeon: Leona Carry, MD;  Location: MAIN OR [place];  Service: Transplant    PR LAP REVISE INTRAPERITONEAL CATHETER N/A 08/26/2019    Procedure: LAPAROSCOPY, SURGICAL; W/REVIS PREV PLACED INTRAPERITONEAL CANNULA/CATH, REMOV INTRALUMIN OBSTRUCT MATERIAL;  Surgeon: Leona Carry, MD;  Location: MAIN OR [facility];  Service: Transplant    PR REDUCE VOLVULUS,INTUSS,INTERN HERNIA N/A 10/13/2022    Procedure: REDUCTION OF VOLVULUS, INTUSSUSCEPTION, INTERNAL HERNIA, BY LAPAROTOMY;  Surgeon: Suella Broad, MD;  Location: MAIN OR [place];  Service: Trauma    PR REMOVAL TUNNELED INTRAPERITONEAL CATHETER N/A 05/06/2018    Procedure: REMOVAL OF PERMANENT INTRAPERITONEAL CANNULA OR CATHETER;  Surgeon: Leona Carry, MD;  Location: MAIN OR [facility];  Service: Transplant    PR REMOVE PERITONEAL FOREIGN BODY N/A 04/08/2019    Procedure: Removal Of Peritoneal Of Foreign Body From Peritoneal Cavity;  Surgeon: Leona Carry, MD;  Location: MAIN OR [facility];  Service: Transplant    PR TRANSPLANT,PREP RENAL GRAFT/ARTERIAL Right 08/06/2022    Procedure: [facility] RECONSTRUCTION CADAVER/LIVING DONOR RENAL ALLOGRAFT PRIOR TO TRANSPLANT; ARTERIAL ANASTOMOSIS EAC;  Surgeon: Toledo, Lilyan Punt, MD;  Location: MAIN OR [facility];  Service: Transplant    PR TRANSPLANTATION OF KIDNEY Right 08/06/2022    Procedure: RENAL ALLOTRANSPLANTATION, IMPLANTATION OF GRAFT; WITHOUT RECIPIENT NEPHRECTOMY;  Surgeon: Toledo, Lilyan Punt, MD;  Location: MAIN OR [facility];  Service: Transplant    TUBAL LIGATION  Family History   Problem Relation Age of Onset    Diabetes Mother     Alzheimer's disease Father     Diabetes Sister     Diabetes Sister     Diabetes Sister     Anesthesia problems Neg Hx     Bleeding Disorder Neg Hx         Allergies: Gabapentin and Valacyclovir                  Objective Findings  Precautions / Restrictions  Precautions: Falls precautions, Delirium Precautions  Weight Bearing Status: Non-applicable  Required Braces or Orthoses: Non-applicable     Pain Comments: Pt denies pain.     Equipment / Environment: Vascular access (PIV, TLC, Port-a-cath, PICC)     Vitals/Orthostatics : VSS per dinamap; NAD, asymptomatic throughout     Living Situation  Living Environment: House  Lives With: Spouse  Home Living: One level home, Stairs to enter with rails, Tub/shower unit, Standard height toilet, Shower chair with back, Walk-in shower  Rail placement (outside): Rail on right side  Number of Stairs to Enter (outside): 3  Caregiver Identified?: Yes  Caregiver Availability: 24 hours  Caregiver Ability: Limited lifting      Cognition: WFL  Cognition comment: alert, able to appropriately follow commands, pleasant  Orientation: Oriented x4  Visual/Perception: Wears Glasses/Contacts (bifocals)  Hearing: Hearing aids present           Upper Extremities  UE ROM: Left WFL, Right WFL  UE Strength: Right WFL, Left WFL  UE comment: able to perform shoulder flexion WFL; able to appropriately use RUE when navigating 4 steps with R rail    Lower Extremities  LE ROM: Right WFL, Left WFL  LE Strength: Right WFL, Left WFL  LE comment: per pt, generalized weakness of BLE; no knee buckling with transfers/ambulation/stairs, able to navigate steps with alternating step pattern          Coordination: Not tested  Proprioception: Not tested  Sensation: WFL    Static Sitting-Level of Assistance: Independent  Dynamic Sitting-Level of Assistance: Independent  Sitting Balance comments: no LOB when EOB    Static Standing-Level of Assistance: Independent  Standing Balance comments: no LOB/knee buckling; dynamic standing assist ranging from CGA to independent      Bed Mobility: Supine to Sit  Supine to Sit assistance level: Modified independent, requires aide device or extra time  Bed Mobility comments: Supine <> sit with modI. HOB elevated. Used rails.     Transfers: Sit to Stand  Sit to Stand assistance level: Independent  Transfer comments: Independently performed STS from low bed with no device. No knee buckling/LOB/instability.      Gait Assistive Device: Other (Comment) (no device)  Gait Distance Ambulated (ft): 388 ft  Skilled Treatment Performed: Pt ambulated 388' with no device. Encouraged to use device to improve stability and endurance, but pt declined. Intermittent slight instability requiring CGA. Per pt and husband, gait at baseline. Primarily required SBA to modI. No LOB/knee buckling/instability. Decreased step length bilaterally and cadence.     Stairs: Pt negotiated 4 steps with R rail and SBA progressing to modI. No LOB/knee buckling/instability. Alternating gait pattern.            Endurance: fair: able to ambulate around the unit and negotiate 4 steps with R rail on RA    Patient at end of session: All needs in reach, In bed, Friends/Family present, Lines intact, Nurse notified    Physical Therapy  Session Duration  PT Individual [mins]: 24          AM-PAC-6 click  Help currently need turning over In bed?: None - Modified Independent/Independent  Help currently needed sitting down/standing up from chair with arms? : None - Modified Independent/Independent  Help currently needed moving from supine to sitting on edge of bed?: None - Modified Independent/Independent  Help currently needed moving to and from bed from wheelchair?: A Little - Minimal/Contact Guard Assist/Supervision  Help currently needed walking in a hospital room?: A Little - Minimal/Contact Guard Assist/Supervision  Help currently needed climbing 3-5 steps with railing?: A Little - Minimal/Contact Guard Assist/Supervision    Basic Mobility Score 6 click: 21    6 click Score (in points): % of Functional Impairment, Limitation, Restriction  6: 100% impaired, limited, restricted  7-8: At least 80%, but less than 100% impaired, limited restricted  9-13: At least 60%, but less than 80% impaired, limited restricted  14-19: At least 40%, but less than 60% impaired, limited restricted  20-22: At least 20%, but less than 40% impaired, limited restricted  23: At least 1%, but less than 20% impaired, limited restricted  24: 0% impaired, limited restricted        I attest that I have reviewed the above information.  Signed: Angelica Ran, PT  Filed 05/21/2023

## 2023-05-21 NOTE — Unmapped (Signed)
IMMUNOCOMPROMISED HOST INFECTIOUS DISEASE PROGRESS NOTE    Assessment/Plan:     Julie [name] is a 72 y.o. 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 Had perinephric drain in place postoperatively, removed 08/30/22  - Immunosuppression: Belatacept 5 mg/kg every 2 weeks (last dose 10/17), MMF 250 mg BID, & prednisone 5 mg daily   - Baseline creatinine 1-1.3 Estimated Creatinine Clearance: 25.3 mL/min (A) (based on SCr of 1.59 mg/dL (H)).        Pertinent comorbidities:  DM II (A1c 5.7 on 01/27/23)  Hysterectomy  SBO 10/07/22 s/p ex lap 10/13/22 w/ lysis of adhesions  Recurrent thrombocytopenia attributed to infection 12/2022, 01/2023     Pertinent exposures:  Originally from [place]   Treated with ivermectin at the ID clinic in 2018  Increased amount of gardening and outdoor exposures over past several months     Summary of pertinent prior infections:  #History of Shingles 2017  #04/2018 Dialysis fluid Serratia R: Ampicillin, Unasyn, Cefazolin,S: Ceftriaxone, Gentamicin, Levo, PipTazo, Tobra  #Thrush w/presumed candidal esophagitis 10/10/22 s/p 14 day treatment fluconazole  #Donor urine cx with C. 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 Coli - s/p Levo x 3 weeks (thru stent removal, 3/15) (S: Cefazolin, Cipro/Levo, Pip-tazo, Cephalexin; R: TMP-SMX, Amp, Amp-sulb)  - 10/05/22 BCx/UCx ESBL/FQ-R E. Coli (S: Erta, Gent, Mero, Tobra, Cefepime SDD; I: ceftaz; R: Cipro/Levo, Cefazolin, Ceftriaxone, Amp) s/p 2w Meropenem; 10/10/22 IR guided drainage negative cx, Cr 1.5 not suggestive of urinoma   - 11/04/22 Bcx/Ucx E. Coli (R CFZ, CTX, cipro/levo, I ceftaz, SDD CFP, susc nitro) s/p 2w Meropenem  - 11/28/22 seen by urogyn, no structural issues, added vaginal estrogen/d-mannose  - 12/25/22 UCx <10,000 E. Coli (S: Cefepime SDD; Erta/Mero, Gent, Nitrofurantoin, Tobra) s/p ertapenem until 01/15/23  - nitrofurantoin prophylaxis (4/29-6/2) not restarted due to reduced GFR and breakthrough. Fosfomycin cost-prohibitive  - 01/27/23 - UCx <1,000 CFU E. Coli, BCx (NG), CTAP: 7.4 x 1.0 x 5.4 cm crescentic fluid collection within the soft tissues lateral to the transplant kidney (VIR unable to aspirate) treated with ertapenem until CT resolution of collection on 04/05/23 imaging  - 05/19/23 CT A/P: LUQ renal allograft without evidence of collecting system dilation or perinephric fluid collection.      Active infection:  # E.Coli & E. Faecalis UTI 05/17/23  - completed ertapenem mid-Sept after resolution of fluid collection on 04/05/23 imaging  - 10/21 Flu/COVID vaccine  - Febrile 10/22 PM with chills vomiting, myalgias, altered mentation. No urgency or dysuria prodrome.  - 10/24 UA: >182 WBC, 6 RBC, bacteria. Ucx: 10-50K E. Coli (S: Cefepime (SDD), Erta/Mero, Gent, Nitrofurantoin, tobra; I: Ceftaz, Levo; R: Amp, Aztreonam, Cefazolin, Ceftriaxone, Cefphalexin, Cipro, Tetracycline, TMP-SMX) & 10-50k E. Faecalis (S: Amp, Nitrofurantoin, Vanc; R: Doxy)   - 10/24 BCx (NGTD)  - 10/26: CT A/P without fluid collections  - 10/27 UCx: mixed urogenital flora; UA: large leuks,  few bacteria     Rx 10/24 Vanc/Cefepime -> 10/25 Ertapenem->10/27 meropenem      Antimicrobial allergy/intolerance:   Penicillin (see allergy list for nuances)  Valacyclovir - nausea            RECOMMENDATIONS    Diagnosis  Follow-Up:  10/24 BCx (NGTD)    Management  Continue Meropenem IV  Will discuss possibility of inpatient fosfomycin as outpatient is unaffordable     Antimicrobial prophylaxis required for transplant immunosuppression   None    Intensive toxicity monitoring for prescription antimicrobials   CBC w/diff at least once per week  CMP at least once per week  clinical assessments for rashes or other skin changes    Solid Organ Transplant Infectious Diseases Follow-up Instructions  Appointment: 06/28/23 at 11:30am with Dr. Michiel Cowboy  Location: 5th [facility].  [address] [place], [place]  Labs: weekly CBC with differential, CMP  Please fax labs to patient???s transplant coordinator: Celene Squibb at  [phone] (Kidney/Pancreas) and to Michela Pitcher ([facility] ID pharmD at [phone])   Antibiotics:   Ertapenem IV 500mg  daily (renally adjusted dose) Antibiotic End Date: TBD in clinic      The [facility] ID APP service will continue to follow.           Please page Darolyn Rua, NP at [phone] from 8-4:30pm, after 4:30 pm & on weekends, please page the ID Transplant/Liquid Oncology Fellow consult at [phone] with questions.    Patient discussed with Dr. Michiel Cowboy.      Darolyn Rua, MSN, APRN, AGNP-C  Immunocompromised Infectious Disease Nurse Practitioner    I personally spent 20 minutes face-to-face and non-face-to-face in the care of this patient, which includes all pre, intra, and post visit time on the date of service.  All documented time was specific to the E/M visit and does not include any procedures that may have been performed.      Subjective:     External record(s): Primary team note: reviewed and noted plan to hold off on diuresis for mild pulmonary edema due to AKI .    Independent historian(s): no independent historian required.       Interval History:   Afebrile and NAEON. Pending tunneled line placement. Patient denies shortness of breath, n/v/d, dysuria, urgency/frequency.     Medications:  Current Medications as of 05/21/2023  Scheduled  PRN   amlodipine, 5 mg, Q12H  atorvastatin, 10 mg, Daily  carvedilol, 25 mg, BID  cholecalciferol (vitamin D3 25 mcg (1,000 units)), 50 mcg, Daily  heparin (porcine) for subcutaneous use, 5,000 Units, Q8H SCH  hydrALAZINE, 100 mg, TID  insulin glargine, 6 Units, Nightly  insulin lispro, 2 Units, 3xd Meals  levothyroxine, 112 mcg, daily  melatonin, 6 mg, Nightly  meropenem, 500 mg, Q12H  [Provider Hold] mycophenolate, 250 mg, BID  pantoprazole, 40 mg, Daily  predniSONE, 5 mg, Daily  sodium bicarbonate, 650 mg, TID      acetaminophen, 1,000 mg, Q8H PRN  dextrose in water, 12.5 g, Q10 Min PRN  glucagon, 1 mg, Once PRN  glucose, 16 g, Q10 Min PRN  ondansetron, 4 mg, Q8H PRN         Objective:     Vital Signs last 24 hours:  Temp:  [36.8 ??C (98.2 ??F)-37.1 ??C (98.8 ??F)] 37.1 ??C (98.8 ??F)  Heart Rate:  [67-74] 67  Resp:  [18] 18  BP: (157-165)/(44-49) 161/44  MAP (mmHg):  [77-82] 77  SpO2:  [97 %-98 %] 97 %  Physical Exam:   Patient Lines/Drains/Airways Status       Active Active Lines, Drains, & Airways       Name Placement date Placement time Site Days    Peripheral IV 05/17/23 Right Wrist 05/17/23  1536  Wrist  4    Arteriovenous Fistula - Vein Graft  Access 08/24/16 Left;Upper Arm 08/24/16  --  Arm  2461                  Const [x]  vital signs above    []  NAD, non-toxic appearance []  Chronically ill-appearing, non-distressed  Sitting in bed      Eyes [x]  Lids normal bilaterally, conjunctiva anicteric and noninjected OU     [] PERRL  [] EOMI        ENMT [x]  Normal appearance of external nose and ears, no nasal discharge        [x]  MMM, no lesions on lips or gums [x]  No thrush, leukoplakia, oral lesions  []  Dentition good []  Edentulous []  Dental caries present  []  Hearing normal  []  TMs with good light reflexes bilaterally         Neck [x]  Neck of normal appearance and trachea midline        []  No thyromegaly, nodules, or tenderness   []  Full neck ROM        Lymph []  No LAD in neck     []  No LAD in supraclavicular area     []  No LAD in axillae   []  No LAD in epitrochlear chains     []  No LAD in inguinal areas        CV []  RRR            []  No peripheral edema     []  Pedal pulses intact   []  No abnormal heart sounds appreciated   [x]  Extremities WWP   Murmur present      Resp [x]  Normal WOB at rest    []  No breathlessness with speaking, no coughing  [x]  CTA anteriorly    [x]  CTA posteriorly          GI [x]  Normal inspection, NTND   []  NABS     []  No umbilical hernia on exam       []  No hepatosplenomegaly     []  Inspection of perineal and perianal areas normal        GU []  Normal external genitalia     [] No urinary catheter present in urethra   []  No CVA tenderness    []  No tenderness over renal allograft        MSK []  No clubbing or cyanosis of hands       []  No vertebral point tenderness  []  No focal tenderness or abnormalities on palpation of joints in RUE, LUE, RLE, or LLE        Skin [x]  No rashes, lesions, or ulcers of visualized skin     []  Skin warm and dry to palpation         Neuro [x]  Face expression symmetric  []  Sensation to light touch grossly intact throughout    [x]  Moves extremities equally    []  No tremor noted        []  CNs II-XII grossly intact     []  DTRs normal and symmetric throughout []  Gait unremarkable        Psych [x]  Appropriate affect       []  Fluent speech         [  x] Attentive, good eye contact  [x]  Oriented to person, place, time          []  Judgment and insight are appropriate           Data for Medical Decision Making     05/17/23 EKG QTcF    I discussed mgm't w/qualified health care professional(s) involved in case: discussed with primary team treatment recommendations including duration .    I reviewed CBC results (Platelets improving), chemistry results (sodium improved and WNL; Creatinine continues to improve), and micro result(s) (E. Faecalis susceptibilities reviewed).    I independently visualized/interpreted not done.       Recent Labs     Units 05/19/23  0925 05/19/23  0925 05/20/23  1103 05/21/23  0944   WBC 10*9/L 8.2  --  6.6 5.4 HGB g/dL 9.4*  --  9.6* 24.4*   PLT 10*9/L 82*  --  99* 109*   NEUTROABS 10*9/L 7.3  --   --   --    LYMPHSABS 10*9/L 0.4*  --   --   --    EOSABS 10*9/L 0.0  --   --   --    BUN mg/dL 46*  --  36* 31*   CREATININE mg/dL 0.10*  --  2.72* 5.36*   AST U/L 11  --  11  --    ALT U/L 12  --  11  --    BILITOT mg/dL 0.5  --  0.3  --    ALKPHOS U/L 67  --  64  --    K mmol/L 3.4  --  3.8 3.8   MG mg/dL  --    < > 2.3 2.1   CALCIUM mg/dL 8.2*  --  8.3* 8.6*    < > = values in this interval not displayed.           Microbiology:  Microbiology Results (last day)       Procedure Component Value Date/Time Date/Time    Urine Culture [[id number]]  (Abnormal)  (Susceptibility) Collected: 05/17/23 1957    Lab Status: Final result Specimen: Urine from Clean Catch Updated: 05/21/23 1629     Urine Culture, Comprehensive 10,000 to 50,000 CFU/mL Escherichia coli      10,000 to 50,000 CFU/mL Enterococcus faecalis    Narrative:      Specimen Source: Clean Catch    Susceptibility       Escherichia coli (1)       Antibiotic Interpretation Microscan Method Status    Ampicillin Resistant  MIC SUSCEPTIBILITY RESULT Final    Aztreonam Resistant  MIC SUSCEPTIBILITY RESULT Final    Cefazolin Resistant  MIC SUSCEPTIBILITY RESULT Final    Cephalexin Resistant  MIC SUSCEPTIBILITY RESULT Final     For uncomplicated UTI's only.       Cefepime Susceptible-Dose Dependent  MIC SUSCEPTIBILITY RESULT Final     Based on 2g every 8h with extended infusion in adult patients.       Ceftazidime Intermediate  MIC SUSCEPTIBILITY RESULT Final    Ceftriaxone Resistant  MIC SUSCEPTIBILITY RESULT Final    Ciprofloxacin Resistant  MIC SUSCEPTIBILITY RESULT Final    Ertapenem Susceptible  MIC SUSCEPTIBILITY RESULT Final    Gentamicin Susceptible  MIC SUSCEPTIBILITY RESULT Final    Levofloxacin Intermediate  MIC SUSCEPTIBILITY RESULT Final    Meropenem Susceptible  MIC SUSCEPTIBILITY RESULT Final    Nitrofurantoin Susceptible  MIC SUSCEPTIBILITY RESULT Final Tetracycline Resistant  MIC SUSCEPTIBILITY RESULT Final  Organisms that test susceptible to tetracycline are considered susceptible to doxycycline.However, some organisms that test intermediate or resistant to tetracycline may be susceptible to doxycycline.       Tobramycin Susceptible  MIC SUSCEPTIBILITY RESULT Final    Trimethoprim + Sulfamethoxazole Resistant  MIC SUSCEPTIBILITY RESULT Final              Enterococcus faecalis (2)       Antibiotic Interpretation Microscan Method Status    Ampicillin Susceptible  KIRBY BAUER Final     For Enterococcus spp., cephalosporins, aminoglycosides, clindamycin and trimethoprim sulfamethoxazole may appear active in vitro but are not effective clinically and are not tested in this laboratory.       Nitrofurantoin Susceptible  KIRBY BAUER Final    Doxycycline Resistant  KIRBY BAUER Final    Vancomycin Screening Plate Susceptible  KIRBY BAUER Final                       Blood Culture [[id number]]  (Normal) Collected: 05/17/23 1511    Lab Status: Preliminary result Specimen: Blood from 1 Peripheral Draw Updated: 05/21/23 1545     Blood Culture, Routine No Growth at 4 days    Urine Culture [[id number]]  (Normal) Collected: 05/20/23 1032    Lab Status: Final result Specimen: Urine from Clean Catch Updated: 05/21/23 0903     Urine Culture, Comprehensive Mixed Urogenital Flora    Narrative:      Specimen Source: Clean Catch    Blood Culture [[id number]]  (Normal) Collected: 05/17/23 1532    Lab Status: Preliminary result Specimen: Blood from 1 Peripheral Draw Updated: 05/20/23 1700     Blood Culture, Routine No Growth at 72 hours             Imaging:  No new imaging

## 2023-05-21 NOTE — Unmapped (Addendum)
Pt alert and oriented x4.Up with 1 assist to the bathroom.Falls precautions maintained.PT/OT today.Tolerated well.Pt for possible discharge to home today with [facility].NPO for Central line placement to VIR today.Husband at bedside.No complaints of pain or discomfort. VIR procedure postponed until tomorrow per VIR.Too many emergent cases.Patient notified.Updated on plan of care by MD.No further needs voiced.VSS.Call bell within reach.plan of care continues.  Problem: Adult Inpatient Plan of Care  Goal: Plan of Care Review  05/21/2023 1352 by Ileene Rubens, RN  Outcome: Progressing  Flowsheets (Taken 05/21/2023 1352)  Progress: improving  05/21/2023 1352 by Ileene Rubens, RN  Outcome: Progressing  Flowsheets (Taken 05/21/2023 1352)  Progress: improving  05/21/2023 1351 by Ileene Rubens, RN  Outcome: Progressing  05/21/2023 1351 by Ileene Rubens, RN  Outcome: Progressing  05/21/2023 1351 by Ileene Rubens, RN  Outcome: Progressing  Flowsheets (Taken 05/21/2023 1351)  Progress: improving  Plan of Care Reviewed With: patient  Goal: Patient-Specific Goal (Individualized)  05/21/2023 1352 by Ileene Rubens, RN  Outcome: Progressing  05/21/2023 1352 by Ileene Rubens, RN  Outcome: Progressing  05/21/2023 1351 by Ileene Rubens, RN  Outcome: Progressing  05/21/2023 1351 by Ileene Rubens, RN  Outcome: Progressing  05/21/2023 1351 by Ileene Rubens, RN  Outcome: Progressing  Goal: Absence of Hospital-Acquired Illness or Injury  05/21/2023 1352 by Ileene Rubens, RN  Outcome: Progressing  05/21/2023 1352 by Ileene Rubens, RN  Outcome: Progressing  05/21/2023 1351 by Ileene Rubens, RN  Outcome: Progressing  05/21/2023 1351 by Ileene Rubens, RN  Outcome: Progressing  05/21/2023 1351 by Ileene Rubens, RN  Outcome: Progressing  Intervention: Identify and Manage Fall Risk  Recent Flowsheet Documentation  Taken 05/21/2023 1200 by Ileene Rubens, RN  Safety Interventions: fall reduction program maintained  Taken 05/21/2023 1000 by Ileene Rubens, RN  Safety Interventions: fall reduction program maintained  Taken 05/21/2023 0730 by Ileene Rubens, RN  Safety Interventions: fall reduction program maintained  Intervention: Prevent and Manage VTE (Venous Thromboembolism) Risk  Recent Flowsheet Documentation  Taken 05/21/2023 1200 by Ileene Rubens, RN  Anti-Embolism Intervention: (heparin sub q) Other (Comment)  Taken 05/21/2023 1000 by Ileene Rubens, RN  Anti-Embolism Intervention: (heparin sub q) Other (Comment)  Taken 05/21/2023 0730 by Ileene Rubens, RN  Anti-Embolism Intervention: (heparin sub q) Other (Comment)  Goal: Optimal Comfort and Wellbeing  05/21/2023 1352 by Ileene Rubens, RN  Outcome: Progressing  05/21/2023 1352 by Ileene Rubens, RN  Outcome: Progressing  05/21/2023 1351 by Ileene Rubens, RN  Outcome: Progressing  05/21/2023 1351 by Ileene Rubens, RN  Outcome: Progressing  05/21/2023 1351 by Ileene Rubens, RN  Outcome: Progressing  Goal: Readiness for Transition of Care  05/21/2023 1352 by Ileene Rubens, RN  Outcome: Progressing  05/21/2023 1352 by Ileene Rubens, RN  Outcome: Progressing  05/21/2023 1351 by Ileene Rubens, RN  Outcome: Progressing  05/21/2023 1351 by Ileene Rubens, RN  Outcome: Progressing  05/21/2023 1351 by Ileene Rubens, RN  Outcome: Progressing  Goal: Rounds/Family Conference  05/21/2023 1352 by Ileene Rubens, RN  Outcome: Progressing  05/21/2023 1352 by Ileene Rubens, RN  Outcome: Progressing  05/21/2023 1351 by Ileene Rubens, RN  Outcome: Progressing  05/21/2023 1351 by Ileene Rubens, RN  Outcome: Progressing  05/21/2023 1351 by Ileene Rubens, RN  Outcome: Progressing

## 2023-05-22 LAB — CBC
HEMATOCRIT: 28.9 % — ABNORMAL LOW (ref 34.0–44.0)
HEMOGLOBIN: 9.9 g/dL — ABNORMAL LOW (ref 11.3–14.9)
MEAN CORPUSCULAR HEMOGLOBIN CONC: 34.2 g/dL (ref 32.0–36.0)
MEAN CORPUSCULAR HEMOGLOBIN: 31.8 pg (ref 25.9–32.4)
MEAN CORPUSCULAR VOLUME: 93 fL (ref 77.6–95.7)
MEAN PLATELET VOLUME: 7.8 fL (ref 6.8–10.7)
PLATELET COUNT: 139 10*9/L — ABNORMAL LOW (ref 150–450)
RED BLOOD CELL COUNT: 3.11 10*12/L — ABNORMAL LOW (ref 3.95–5.13)
RED CELL DISTRIBUTION WIDTH: 13.7 % (ref 12.2–15.2)
WBC ADJUSTED: 5.6 10*9/L (ref 3.6–11.2)

## 2023-05-22 LAB — BASIC METABOLIC PANEL
ANION GAP: 7 mmol/L (ref 5–14)
BLOOD UREA NITROGEN: 29 mg/dL — ABNORMAL HIGH (ref 9–23)
BUN / CREAT RATIO: 21
CALCIUM: 9.5 mg/dL (ref 8.7–10.4)
CHLORIDE: 107 mmol/L (ref 98–107)
CO2: 27 mmol/L (ref 20.0–31.0)
CREATININE: 1.36 mg/dL — ABNORMAL HIGH
EGFR CKD-EPI (2021) FEMALE: 41 mL/min/{1.73_m2} — ABNORMAL LOW (ref >=60)
GLUCOSE RANDOM: 131 mg/dL (ref 70–179)
POTASSIUM: 4.2 mmol/L (ref 3.4–4.8)
SODIUM: 141 mmol/L (ref 135–145)

## 2023-05-22 LAB — MAGNESIUM: MAGNESIUM: 2.1 mg/dL (ref 1.6–2.6)

## 2023-05-22 MED ADMIN — carvedilol (COREG) tablet 25 mg: 25 mg | ORAL | @ 01:00:00

## 2023-05-22 MED ADMIN — insulin glargine (LANTUS) injection 6 Units: 6 [IU] | SUBCUTANEOUS | @ 01:00:00

## 2023-05-22 MED ADMIN — amlodipine (NORVASC) tablet 5 mg: 5 mg | ORAL | @ 12:00:00

## 2023-05-22 MED ADMIN — meropenem (MERREM) 500 mg in sodium chloride 0.9 % (NS) 100 mL IVPB-MBP: 500 mg | INTRAVENOUS | @ 06:00:00 | Stop: 2023-05-22

## 2023-05-22 MED ADMIN — levothyroxine (SYNTHROID) tablet 112 mcg: 112 ug | ORAL | @ 11:00:00

## 2023-05-22 MED ADMIN — sodium bicarbonate tablet 650 mg: 650 mg | ORAL | @ 18:00:00

## 2023-05-22 MED ADMIN — fentaNYL (PF) (SUBLIMAZE) injection: INTRAVENOUS | @ 14:00:00 | Stop: 2023-05-22

## 2023-05-22 MED ADMIN — heparin (porcine) 5,000 unit/mL injection 5,000 Units: 5000 [IU] | SUBCUTANEOUS | @ 18:00:00

## 2023-05-22 MED ADMIN — mycophenolate (CELLCEPT) capsule 250 mg: 250 mg | ORAL | @ 01:00:00

## 2023-05-22 MED ADMIN — hydrALAZINE (APRESOLINE) tablet 100 mg: 100 mg | ORAL | @ 01:00:00

## 2023-05-22 MED ADMIN — midazolam (VERSED) injection: INTRAVENOUS | @ 14:00:00 | Stop: 2023-05-22

## 2023-05-22 MED ADMIN — insulin lispro (HumaLOG) injection 2 Units: 2 [IU] | SUBCUTANEOUS | @ 18:00:00

## 2023-05-22 MED ADMIN — heparin lock flush (porcine) 100 unit/mL injection: INTRAVENOUS | @ 14:00:00 | Stop: 2023-05-22

## 2023-05-22 MED ADMIN — amlodipine (NORVASC) tablet 5 mg: 5 mg | ORAL | @ 01:00:00

## 2023-05-22 MED ADMIN — hydrALAZINE (APRESOLINE) tablet 100 mg: 100 mg | ORAL | @ 19:00:00

## 2023-05-22 MED ADMIN — sodium bicarbonate tablet 650 mg: 650 mg | ORAL | @ 01:00:00

## 2023-05-22 MED ADMIN — pantoprazole (Protonix) EC tablet 40 mg: 40 mg | ORAL | @ 12:00:00

## 2023-05-22 MED ADMIN — sodium bicarbonate tablet 650 mg: 650 mg | ORAL | @ 12:00:00

## 2023-05-22 MED ADMIN — melatonin tablet 6 mg: 6 mg | ORAL | @ 01:00:00

## 2023-05-22 MED ADMIN — heparin (porcine) 5,000 unit/mL injection 5,000 Units: 5000 [IU] | SUBCUTANEOUS | @ 01:00:00

## 2023-05-22 MED ADMIN — lidocaine (PF) (XYLOCAINE-MPF) 10 mg/mL (1 %) injection: INTRADERMAL | @ 14:00:00 | Stop: 2023-05-22

## 2023-05-22 MED ADMIN — hydrALAZINE (APRESOLINE) tablet 100 mg: 100 mg | ORAL | @ 12:00:00

## 2023-05-22 MED ADMIN — cholecalciferol (vitamin D3 25 mcg (1,000 units)) tablet 50 mcg: 50 ug | ORAL | @ 12:00:00

## 2023-05-22 MED ADMIN — atorvastatin (LIPITOR) tablet 10 mg: 10 mg | ORAL | @ 12:00:00

## 2023-05-22 MED ADMIN — carvedilol (COREG) tablet 25 mg: 25 mg | ORAL | @ 12:00:00

## 2023-05-22 MED ADMIN — insulin lispro (HumaLOG) injection 2 Units: 2 [IU] | SUBCUTANEOUS

## 2023-05-22 MED ADMIN — mycophenolate (CELLCEPT) capsule 250 mg: 250 mg | ORAL | @ 12:00:00

## 2023-05-22 MED ADMIN — ertapenem (INVANZ) 500 mg in sodium chloride (NS) 0.9 % 50 mL IVPB: 500 mg | INTRAVENOUS | @ 19:00:00 | Stop: 2023-05-29

## 2023-05-22 NOTE — Unmapped (Signed)
Internal Medicine (MEDU) Progress Note    Assessment & Plan:   Julie [name] is a 72 y.o. F with history of ESRD s/p DDKT 07/2022, recurrent MDR E. coli UTIs who presented with fever, fatigue, and chills likely due to UTI.    Principal Problem:    UTI (urinary tract infection)  Active Problems:    Type 2 diabetes mellitus with diabetic nephropathy, with long-term current use of insulin (CMS-HCC)    Hypothyroidism, acquired    Kidney transplant recipient    Immunosuppressed status (CMS-HCC)    Primary hypertension    Mixed hyperlipidemia    Chronic idiopathic thrombocytopenia (CMS-HCC)    AKI (acute kidney injury) (CMS-HCC)    Hyponatremia      Urinary tract infection  History of Recurrent MDR E. Coli UTIs  Patient presented with 3 day history of fever, chills, and 3 episodes of NBNB emesis in ED. ED work-up notable for WBC 12.8, Plt 100, BUN 35, Cr 1.44. UA with large LE, >182 WBC, 8 RBC, moderate bacteria. Patient has had multiple MDR E. coli UTIs in the past, which which were susceptible to ertapenem. Infectious workup notable for Bcx2 (10/24) NGTD, RPP negative, CXR without consolidation, CMV and EBV PCR negative.  10/24 urine culture positive for E. coli (MDR, similar to prior) and E faecalis. Repeat 10/27 urine culture w/ mixed urogenital flora. Remains afebrile and resolution of leukocytosis. Plan for discharge home with IV ertapenem and family teaching on 10/30.  - Antimicrobial therapy  - Ertapenem 1 g daily (10/25-10/26).  Meropenem, renally dosed (10/27-10/28)  - Restart ertapenem (10/28 - ), anticipating months of treatment  - Consulted VIR for tunneled line to be placed 10/29  - Continue twice weekly vaginal estrogen at discharge  - Appreciate pharmacy's help in approval for methenamine hippurate   - Isolation precautions  - Appreciate ICID's help in management of patient    Perinephric Fluid Collection (resolved)  [place] identified perinephric collection 10/05/22 (8.1 x 0.8 x 2.3 cm) s/p IR guided drainage with negative culture and Cr. 1.5. Subsequent CTAP (7.4 x 1.0 x 5.4 cm) crescentic fluid collection within the soft tissues lateral to the transplant kidney that was unable to be aspirated with VIR. CTAP (04/05/23) with resolution of perinephric fluid collection.  CTAP 10/25 without dilation or perinephric fluid, overall unchanged.  - Workup/treatment of UTI as above.    AKI on DDRT 07/2022 (improving)  Baseline Cr 1-1.3. Peak Cr 2.32 during admission likely due to prerenal AKI from decreased oral intake and infection. She was managed with 1L IVF and started on oral rehydration protocol. Renal transplant [place] (10/24) with patent vasculature and improved resistive indices. Workup notable for UPCR 1. Cr continues to improve, now 1.36 back near baseline.  -Oral rehydration protocol ordered  -Immunosuppression  - Betalacept 5mg  q2 weeks (last dose 10/17)  - Prednisone 5mg  daily  - Restart MMF 250 mg BID  - Sodium bicarbonate 650mg  TID  - Hold home chlorthalidone  - Appreciate transplant nephrology's help    Aortic Stenosis - HFpEF  New diagnosis of HFpEF, currently compensated.  New diagnosis of mild-mod AS. Had a oxygen requirement of 1 L briefly after getting 1 L of fluids for her AKI.  No edema. Pro-BNP ~8,000. Some basilar crackles on exam, no edema. 05/19/2023 TTE with mild/mod aortic stenosis, EF 55%, Grd II diastolic dysfxn/elevated filling pressure.  Previous TTE 10/08/2022 showing no aortic stenosis, grade 1 diastolic dysfunction - thus this new echo (10.26) does represent  a worsening in both aortic valve and diastolic dysfunction.  Patient does have a previously known systolic murmur.  Patient does not endorse symptoms of shortness of breath/orthopnea.  Patient was already on spironolactone at home; this is currently held in the setting of AKI.   -Overall, though this patient seems to have some mild pulmonary edema; we are holding off on diuresis at this time due to AKI which seemed very volume dependent, and she is otherwise euvolemic.  -Monitor volume status/O2 requirements, diurese prn. Avoid IVF if able.  -Restart spironolactone when able  -Holding off on SGLT-2 given UTIs.  -Follow up with cardiology outpatient. Consider Entresto outpatient.    IDDM  Blood sugars necessitating better control. Will increase nightly lantus and schedule lispro with meals.  - Lantus increased to 6 units nightly  - Start lispro 2 units TID with meals  - SSI    Other Conditions  HTN: amlodipine 5mg , coreg 25mg  BID, hydralazine 100mg  TID  HLD: lipitor 10mg   Hypothyroidism: synthroid daily  GERD: protonix 40mg  daily    Daily Checklist:  Diet: Regular Diet  DVT PPx: Heparin 5000units q8h  Electrolytes: Replete Potassium to >/= 3.6 and Magnesium to >/= 1.8  Code Status: Full Code  Dispo:  Inpatient    Team Contact Information:   Primary Team: Internal Medicine (MEDU)  Primary Resident: Kathrin Penner, MD  Resident's Pager: [phone] (Gen MedW Intern - Alvester Morin)    Interval History:     NAEO. She did receive full dose of lantus instead of half dose while NPO. Plan for VIR placement of powerline today for IV abx administration around noon. This morning she feels well. She and her husband feel comfortable with managing IV antibiotics at home and said that they previously managed for a matter of months. Plan is for teaching of home administration of IV on 10/30 and DC following.    Objective:   Temp:  [36.8 ??C (98.2 ??F)-37.1 ??C (98.8 ??F)] 36.8 ??C (98.2 ??F)  Heart Rate:  [67-70] 70  Resp:  [17-18] 17  BP: (157-166)/(44-49) 166/44  SpO2:  [95 %-97 %] 95 %    Gen: Well-appearing woman in NAD, converses appropriately  HENT: atraumatic, normocephalic, sclera anicteric  Heart: 3/6 systolic murmur present throughout all fields, loudest at apex.  Normal rate, regular rhythm.  Lungs: normal respiratory effort on RA, mild crackles present at bases; R>L.  Abdomen: soft, NTND, no CVA tenderness  Extremities: Trace edema in BLE    Kathrin Penner, MD  Internal Medicine PGY1

## 2023-05-22 NOTE — Unmapped (Signed)
Transplant Nephrology Consult     Requesting Attending Physician :  Mauri Brooklyn, MD  Service Requesting Consult : [facility] Counter ([facility])  Reason for Consult: immunosuppression management    Assessment and Plan:    # S/p Kidney Transplant 08/06/2022, Kidney allograft function:  # AKI  - Graft function: creatinine previously 0.9-1, now 1-1.3  - DSAs: not present as of 09/06/22  - Early post-txp course notable for multiple readmissions for UTI, ileus/SBO due to volvulus, and for resistant hypertension  - Scr improving    # Immunosuppression:  - Belatacept 5mg /kg q2 weeks (last dose 10/17), Mycophenolate 250 mg bid, prednisone 5 mg daily    # Blood Pressure / Volume:  - Goal BP <130/80  - current meds: amlodipine 5 mg bid, carvedilol 25 mg bid, chlorthalidone 50 mg daily, hydralazine 100 mg daily, spironolactone 25 mg daily    # Infectious Prophylaxis and Monitoring:  # E.coli and E. Faecalis UTI   CMV D+/R+, EBV D+/R+  - h/o Frequent UTIs (ESBL E coli) driving 5 post-transplant admissions. 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 Follows with [facility] outpatient.   - previously on IV Ertapenem that ended around 9/13 w/ tunneled CVC removed on 9/16              - prior CT scan on 9/12 showed resolution of fluid collection              - ID to have started on ppx fosfomycin              - was supposed to urology on 08/08 but cancelled by patient   - previously on nitrofurantoin for ppx, methenamine cost-prohibitive  - Meropenem per IC[facility] recommendations    RECOMMENDATIONS:   - Resume mycophenolate  - Transplant patients with an open wound require wound care with sterile water only. The patient should be counseled on this at the time of discharge if they have not already been doing this.  - We will continue to follow.     Worthy Keeler, DO  05/21/2023 8:08 PM     Medical decision-making for 05/21/23  Findings / Data     Patient has: []  acute illness w/systemic sxs  [mod]  []  two or more stable chronic illnesses [mod]  []  one chronic illness with acute exacerbation [mod]  []  acute complicated illness  [mod]  []  Undiagnosed new problem with uncertain prognosis  [mod] [x]  illness posing risk to life or bodily function (ex. AKI)  [high]  []  chronic illness with severe exacerbation/progression  [high]  []  chronic illness with severe side effects of treatment  [high] AKI Probs At least 2:  Probs, Data, Risk   I reviewed: [x]  primary team note  []  consultant note(s)  []  external records [x]  chemistry results  [x]  CBC results  []  blood gas results  []  Other []  procedure/op note(s)   []  radiology report(s)  []  micro result(s)  []  w/ independent historian(s) Elevated Scr >=3 Data Review (2 of 3)    I independently interpreted: []  Urine Sediment  [x]  Renal US []  CXR Images  []  CT Images  []  Other []  EKG Tracing  Any     I discussed: []  Pathology results w/ QHPs(s) from other specialties  []  Procedural findings w/ QHPs(s) from other specialties []  Imaging w/ QHP(s) from other specialties  [x]  Treatment plan w/ QHP(s) from other specialties Plan discussed with primary team Any     Mgm't requires: []   Prescription drug(s)  [mod]  []  Kidney biopsy  [mod]  []  Central line placement  [mod] [x]  High risk medication use and/or intensive toxicity monitoring [high]  []  Renal replacement therapy [high]  []  High risk kidney biopsy  [high]  []  Escalation of care  [high]  []  High risk central line placement  [high] Immunosuppression: high risk for infection Risk      _______________________________________________________    Transplant Background  Date of Transplant: 08/06/2022 (Kidney)  Type of Transplant: DCD  KDPI: 56%  Cold ischemic time: 877 minutes (14 hr 37 min)  Warm ischemic time: 32 minutes  cPRA: 0%  HLA match:   Blood type: Donor A1, Recipient A POS  ID: CMV D+/R+, EBV D+/R+  Native Kidney Disease: Diabetes              Native kidney biopsy: Diabetic nephropathy (done elsewhere)              Pre-transplant dialysis course: PD started July 2017, transitioned to HD October 2019 due to catheter dysfunction/inadequate dialysis  Prior Transplants: None  Induction: Basiliximab  Early steroid withdrawal: No     Biopsies:   None     History of Present Illness:  Julie [name] is a/an 72 y.o. 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 Nephrology has been consulted for DDKT.      History is obtained from husband over the phone. According to him, both he and Ms. Colon received their flu + COVID vaccine this past Monday. However, patient started to have low-grade fevers around 99.4 F 2 days later. Yesterday, patient started to have recurrent fevers that improved with Tylenol up to 102.7 F. Associated symptoms during this time include: vomiting & loose stool (husband reports this started when at the ED). No URI symptoms, cough, LAD, abdominal pain, GU, rashes, arthralgia, or meningeal signs. Patient does have a LUE AVF but no concerning features for infection. Patient has not traveled recently but did recently return to church this past Sunday for the first time since her transplant. Did not report any sick exposures. Appetite has been good as have BP with average being between 140-150s/40-50s per husband. No NSAID use.    Interval Hx: Patient seen and examined at bedside in NAD. No acute events overnight. Feeling much better today.    Medications:  Current Facility-Administered Medications:     acetaminophen (TYLENOL) tablet 1,000 mg, Oral, Q8H PRN    amlodipine (NORVASC) tablet 5 mg, Oral, Q12H    atorvastatin (LIPITOR) tablet 10 mg, Oral, Daily    carvedilol (COREG) tablet 25 mg, Oral, BID    cholecalciferol (vitamin D3 25 mcg (1,000 units)) tablet 50 mcg, Oral, Daily    dextrose (D10W) 10% bolus 125 mL, Intravenous, Q10 Min PRN    [START ON 05/22/2023] ertapenem (INVANZ) 500 mg in sodium chloride (NS) 0.9 % 50 mL IVPB, Intravenous, Q24H    glucagon injection 1 mg, Intramuscular, Once PRN    glucose chewable tablet 16 g, Oral, Q10 Min PRN    heparin (porcine) 5,000 unit/mL injection 5,000 Units, Subcutaneous, Q8H SCH    hydrALAZINE (APRESOLINE) tablet 100 mg, Oral, TID    insulin glargine (LANTUS) injection 6 Units, Subcutaneous, Nightly    insulin lispro (HumaLOG) injection 2 Units, Subcutaneous, 3xd Meals    levothyroxine (SYNTHROID) tablet 112 mcg,  Oral, daily    melatonin tablet 6 mg, Oral, Nightly    [START ON 05/22/2023] meropenem (MERREM) 500 mg in sodium chloride 0.9 % (NS) 100 mL IVPB-MBP, Intravenous, Q12H    mycophenolate (CELLCEPT) capsule 250 mg, Oral, BID    ondansetron (ZOFRAN) injection 4 mg, Intravenous, Q8H PRN    pantoprazole (Protonix) EC tablet 40 mg, Oral, Daily    predniSONE (DELTASONE) tablet 5 mg, Oral, Daily    sodium bicarbonate tablet 650 mg, Oral, TID    Prior to Admission medications    Medication Dose, Route, Frequency   amlodipine (NORVASC) 5 MG tablet 5 mg, Oral, Every 12 hours   atorvastatin (LIPITOR) 10 MG tablet 10 mg, Oral, Daily (standard)   carvedilol (COREG) 25 MG tablet 25 mg, Oral, 2 times a day (standard)   chlorthalidone (HYGROTON) 25 MG tablet 50 mg, Oral, Daily (standard)   estradiol (ESTRACE) 0.01 % (0.1 mg/gram) vaginal cream 2 g, Vaginal, Nightly   hydrALAZINE (APRESOLINE) 100 MG tablet 100 mg, Oral, 3 times a day (standard)   insulin glargine (BASAGLAR, LANTUS) 100 unit/mL (3 mL) injection pen 5 Units, Subcutaneous, Nightly   levothyroxine (SYNTHROID) 112 MCG tablet 112 mcg, Oral, Daily (standard)   magnesium oxide (MAG-OX) 400 mg (241.3 mg elemental magnesium) tablet 400 mg, Oral, 2 times a day (standard)   mycophenolate (CELLCEPT) 250 mg capsule 250 mg, Oral, 2 times a day (standard)   pantoprazole (PROTONIX) 40 MG tablet 40 mg, Oral, Daily PRN   predniSONE (DELTASONE) 5 MG tablet 5 mg, Oral, Daily sodium bicarbonate 650 mg tablet 650 mg, Oral, 3 times a day (standard)   spironolactone (ALDACTONE) 25 MG tablet 25 mg, Oral, Daily (standard)   alendronate (FOSAMAX) 70 MG tablet 70 mg, Oral, Every 7 days   blood-glucose sensor (DEXCOM G7 SENSOR) Devi Use 1 sensor every 10 days.   cholecalciferol, vitamin D3 25 mcg, 1,000 units,, (CHOLECALCIFEROL-25 MCG, 1,000 UNIT,) 1,000 unit (25 mcg) tablet 50 mcg, Oral, Daily (standard)   cranberry fruit extract (CRANBERRY CONCENTRATE ORAL) 1 capsule, Oral, Daily   cyanocobalamin, vitamin B-12, 1000 MCG tablet 1,000 mcg, Oral, Daily (standard)   D-MANNOSE ORAL 2,000 mg, Oral, Daily   insulin aspart (NOVOLOG) 100 unit/mL vial 3-5 Units, Subcutaneous, 3 times a day (AC)   lancets Misc Use to check blood sugar as directed with insulin 3 times a day & for symptoms of high or low blood sugar.   melatonin 5 mg tablet 5 mg, Oral, At bedtime   nitrofurantoin, macrocrystal-monohydrate, (MACROBID) capsule 100 mg, Oral, Once      Allergies:  Gabapentin and Valacyclovir    Medical History:  Past Medical History:   Diagnosis Date    Acute metabolic acidosis 01/29/2023    Acute metabolic encephalopathy 01/29/2023    AKI (acute kidney injury) (CMS-HCC) 01/29/2023    Anemia     Anemia due to stage 5 chronic kidney disease, not on chronic dialysis (CMS-HCC) 08/08/2022    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)     since July 2017    Hypertension     Hypothyroidism (acquired)     Kidney transplant status, cadaveric 08/08/2022    Type 2 diabetes mellitus (CMS-HCC)      Past Surgical History:   Procedure Laterality Date    CATARACT EXTRACTION      HYSTERECTOMY      OOPHORECTOMY      PERITONEAL CATHETER INSERTION      PR LAP INSERTION TUNNELED INTRAPERITONEAL CATHETER N/A 09/27/2018  Procedure: LAPAROSCOPY, SURGICAL; WITH INSERTION OF INTRAPERITONEAL CANNULA OR CATHETER, PERMANENT;  Surgeon: Leona Carry, MD;  Location[facility] MAIN [facility];  Service: Transplant    PR LAP INSERTION TUNNELED INTRAPERITONEAL CATHETER N/A 04/08/2019    Procedure: LAPAROSCOPY, SURGICAL; WITH INSERTION OF INTRAPERITONEAL CANNULA OR CATHETER, PERMANENT;  Surgeon: Leona Carry, MD;  Location: MAIN OR [facility];  Service: Transplant    PR LAP REVISE INTRAPERITONEAL CATHETER N/A 08/26/2019    Procedure: LAPAROSCOPY, SURGICAL; W/REVIS PREV PLACED INTRAPERITONEAL CANNULA/CATH, REMOV INTRALUMIN OBSTRUCT MATERIAL;  Surgeon: Leona Carry, MD;  Location: MAIN [facility];  Service: Transplant    PR REDUCE VOLVULUS,INTUSS,INTERN HERNIA N/A 10/13/2022    Procedure: REDUCTION OF VOLVULUS, INTUSSUSCEPTION, INTERNAL HERNIA, BY LAPAROTOMY;  Surgeon: Suella Broad, MD;  Location: MAIN OR [place];  Service: Trauma    PR REMOVAL TUNNELED INTRAPERITONEAL CATHETER N/A 05/06/2018    Procedure: REMOVAL OF PERMANENT INTRAPERITONEAL CANNULA OR CATHETER;  Surgeon: Leona Carry, MD;  Location: MAIN OR [facility];  Service: Transplant    PR REMOVE PERITONEAL FOREIGN BODY N/A 04/08/2019    Procedure: Removal Of Peritoneal Of Foreign Body From Peritoneal Cavity;  Surgeon: Leona Carry, MD;  Location: MAIN OR [facility];  Service: Transplant    PR TRANSPLANT,PREP RENAL GRAFT/ARTERIAL Right 08/06/2022    Procedure: [facility] RECONSTRUCTION CADAVER/LIVING DONOR RENAL ALLOGRAFT PRIOR TO TRANSPLANT; ARTERIAL ANASTOMOSIS EAC;  Surgeon: Toledo, Lilyan Punt, MD;  Location: MAIN OR [facility];  Service: Transplant    PR TRANSPLANTATION OF KIDNEY Right 08/06/2022    Procedure: RENAL ALLOTRANSPLANTATION, IMPLANTATION OF GRAFT; WITHOUT RECIPIENT NEPHRECTOMY;  Surgeon: Toledo, Lilyan Punt, MD;  Location: MAIN OR [facility];  Service: Transplant    TUBAL LIGATION       Social History:  Social History     Social History Narrative    Lives in [place] with her husband. Moved from [place] to the [place] when she was around 19.      reports that she quit smoking about 16 years ago. Her smoking use included cigarettes. She started smoking about 46 years ago. She has a 22.5 pack-year smoking history. She has quit using smokeless tobacco. She reports current alcohol use. She reports that she does not use drugs.     Family History:  Family History   Problem Relation Age of Onset    Diabetes Mother     Alzheimer's disease Father     Diabetes Sister     Diabetes Sister     Diabetes Sister     Anesthesia problems Neg Hx     Bleeding Disorder Neg Hx       Physical Exam:   Vitals:    05/20/23 1645 05/20/23 2128 05/21/23 0800 05/21/23 1653   BP: 165/48 157/49 161/44 166/44   Pulse: 74 67 67 70   Resp: 18  18 17    Temp: 36.8 ??C (98.2 ??F) 37 ??C (98.6 ??F) 37.1 ??C (98.8 ??F) 36.8 ??C (98.2 ??F)   TempSrc: Oral Oral Oral    SpO2: 98% 97% 97% 95%   Weight:       Height:         No intake/output data recorded.    Intake/Output Summary (Last 24 hours) at 05/21/2023 2008  Last data filed at 05/21/2023 1610  Gross per 24 hour   Intake 460 ml   Output --   Net 460 ml     Constitutional: ill-appearing, no acute distress  Heart: regular rate and rhythm, no murmurs, rubs, or gallops  Lungs:  clear to auscultation bilaterally without adventitious sounds  Abd: soft, non-tender, non-distended  Ext: no lower extremity edema

## 2023-05-22 NOTE — Unmapped (Cosign Needed)
[place] INTERVENTIONAL RADIOLOGY - Operative Note     VIR Post-Procedure Note    Procedure Name: SL Powerline    Pre-Op Diagnosis: Recurrent UTI    Post-Op Diagnosis: Same as pre-operative diagnosis    VIR Providers    Attending Physician: Dr. Claretta Fraise    Fellow/Resident: Jake Bathe, MD    Time out: Prior to the procedure, a time out was performed with all team members present. During the time out, the patient, procedure and procedure site when applicable were verbally verified by the team members and Jake Bathe, MD.    Description of procedure: Successful insertion of left sided SL tunneled powerline    Plan: Line ok to use    Sedation:Moderate sedation    Estimated Blood Loss: approximately 5 mL  Complications: None    See detailed procedure note with images in PACS [facility]).    The patient tolerated the procedure well without incident or complication and left the room in stable condition.    Jake Bathe, MD  05/22/2023 3:08 PM

## 2023-05-22 NOTE — Unmapped (Signed)
Pt with adm dx of UTI . Pt drowsy early part of shift but easy to arouse . Pt  A & O x 4 and able to express needs .  VSS , afebrile , room air . Pt voiced no complaints , denied pain 0/10 , no N/V , no SOB/dyspnea . Pt tolerated scheduled HS meds well . IV antibiotic regimen in process , tolerated infusions well , no adverse side effects noted . Assistance with ADLS provided ,Pt OOB to BR with minimal assistance , gait slightly unsteady . Pt  able to turn and repositioned self in bed for comfort . Falls and safety precautions reinforced with bed low and locked , SR up x 3 , call bell within reach , bed alarm on . Pt resting quietly in bed with eyes closed , no s/sx of acute distress noted . Will continue with current POC . Spouse @ the bedside and supportive . Pt NPO since MN for possible CVC placement via VIR on 05/22/23 .     Problem: Adult Inpatient Plan of Care  Goal: Plan of Care Review  Outcome: Progressing  Goal: Patient-Specific Goal (Individualized)  Outcome: Progressing  Flowsheets (Taken 05/22/2023 0439)  Patient/Family-Specific Goals (Include Timeframe): Pt will remain hemodynamically stable and free from falls and injuries during this shift 7p - 7a .  Individualized Care Needs: V/S , labs , ACHS , pain mgmt , IV ABT regimen , NPO , CVC placement /VIR , falls/safety precautions  Anxieties, Fears or Concerns: Pt voiced no concerns this shift  Goal: Absence of Hospital-Acquired Illness or Injury  Outcome: Progressing  Intervention: Identify and Manage Fall Risk  Recent Flowsheet Documentation  Taken 05/22/2023 0400 by Harlow Ohms, RN  Safety Interventions:   fall reduction program maintained   environmental modification   low bed   family at bedside   isolation precautions   nonskid shoes/slippers when out of bed   room near unit station  Taken 05/22/2023 0200 by Harlow Ohms, RN  Safety Interventions:   fall reduction program maintained   environmental modification   low bed   nonskid shoes/slippers when out of bed   isolation precautions   family at bedside  Taken 05/22/2023 0000 by Harlow Ohms, RN  Safety Interventions:   fall reduction program maintained   environmental modification   family at bedside   isolation precautions   low bed   nonskid shoes/slippers when out of bed  Taken 05/21/2023 2200 by Harlow Ohms, RN  Safety Interventions:   fall reduction program maintained   environmental modification   low bed   isolation precautions   family at bedside   nonskid shoes/slippers when out of bed  Taken 05/21/2023 2000 by Harlow Ohms, RN  Safety Interventions:   fall reduction program maintained   environmental modification   low bed   nonskid shoes/slippers when out of bed   isolation precautions   family at bedside  Intervention: Prevent Skin Injury  Recent Flowsheet Documentation  Taken 05/21/2023 2000 by Harlow Ohms, RN  Positioning for Skin:   Left   Supine/Back  Device Skin Pressure Protection: absorbent pad utilized/changed  Skin Protection: incontinence pads utilized  Intervention: Prevent and Manage VTE (Venous Thromboembolism) Risk  Recent Flowsheet Documentation  Taken 05/22/2023 0400 by Annamaria Helling A, RN  Anti-Embolism Intervention: (VTE : Heparin SQ) Other (Comment)  Taken 05/22/2023 0200 by Annamaria Helling A, RN  Anti-Embolism Intervention: (VTE : Heparin SQ) Other (Comment)  Taken  05/22/2023 0000 by Harlow Ohms, RN  Anti-Embolism Intervention: (VTE : Heparin SQ) Other (Comment)  Taken 05/21/2023 2200 by Annamaria Helling A, RN  Anti-Embolism Intervention: (VTE : Heparin SQ) Other (Comment)  Taken 05/21/2023 2000 by Harlow Ohms, RN  Anti-Embolism Intervention: (VTE : Heparin SQ) Other (Comment)  Intervention: Prevent Infection  Recent Flowsheet Documentation  Taken 05/22/2023 0400 by Harlow Ohms, RN  Infection Prevention: rest/sleep promoted  Taken 05/22/2023 0200 by Harlow Ohms, RN  Infection Prevention: rest/sleep promoted  Taken 05/22/2023 0000 by Harlow Ohms, RN  Infection Prevention: rest/sleep promoted  Taken 05/21/2023 2000 by Harlow Ohms, RN  Infection Prevention: environmental surveillance performed  Goal: Optimal Comfort and Wellbeing  Outcome: Progressing  Goal: Readiness for Transition of Care  Outcome: Progressing  Goal: Rounds/Family Conference  Outcome: Progressing

## 2023-05-22 NOTE — Unmapped (Signed)
IMMUNOCOMPROMISED HOST INFECTIOUS DISEASE PROGRESS NOTE    Assessment/Plan:     Julie [name] is a 72 y.o. 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 Had perinephric drain in place postoperatively, removed 08/30/22  - Immunosuppression: Belatacept 5 mg/kg every 2 weeks (last dose 10/17), MMF 250 mg BID, & prednisone 5 mg daily   - Baseline creatinine 1-1.3 Estimated Creatinine Clearance: 29.5 mL/min (A) (based on SCr of 1.36 mg/dL (H)).     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 Coli - s/p Levo x 3 weeks (thru stent removal, 3/15) (S: Cefazolin, Cipro/Levo, Pip-tazo, Cephalexin; R: TMP-SMX, Amp, Amp-sulb)  - 10/05/22 BCx/UCx ESBL/FQ-R E. Coli (S: Erta, Gent, Mero, Tobra, Cefepime SDD; I: ceftaz; R: Cipro/Levo, Cefazolin, Ceftriaxone, Amp) s/p 2w Meropenem; 10/10/22 IR guided drainage negative cx, Cr 1.5 not suggestive of urinoma   - 11/04/22 Bcx/Ucx E. Coli (R CFZ, CTX, cipro/levo, I ceftaz, SDD CFP, susc nitro) s/p 2w Meropenem  - 11/28/22 seen by urogyn, no structural issues, added vaginal estrogen/d-mannose  - 12/25/22 UCx <10,000 E. Coli (S: Cefepime SDD; Erta/Mero, Gent, Nitrofurantoin, Tobra) s/p ertapenem until 01/15/23  - nitrofurantoin prophylaxis (4/29-6/2) not restarted due to reduced GFR and breakthrough. Fosfomycin cost-prohibitive  - 01/27/23 - UCx <1,000 CFU E. Coli, BCx (NG), CTAP: 7.4 x 1.0 x 5.4 cm crescentic fluid collection within the soft tissues lateral to the transplant kidney (VIR unable to aspirate) treated with ertapenem until CT resolution of collection on 04/05/23 imaging  - 05/19/23 CT A/P: LUQ renal allograft without evidence of collecting system dilation or perinephric fluid collection.      Active infection:  # E.Coli & E. Faecalis UTI 05/17/23  - completed ertapenem mid-Sept after resolution of fluid collection on 04/05/23 imaging  - 10/21 Flu/COVID vaccine  - Febrile 10/22 PM with chills vomiting, myalgias, altered mentation. No urgency or dysuria prodrome.  - 10/24 UA: >182 WBC, 6 RBC, bacteria. Ucx: 10-50K E. Coli (S: Cefepime (SDD), Erta/Mero, Gent, Nitrofurantoin, tobra; I: Ceftaz, Levo; R: Amp, Aztreonam, Cefazolin, Ceftriaxone, Cefphalexin, Cipro, Tetracycline, TMP-SMX) & 10-50k E. Faecalis (S: Amp, Nitrofurantoin, Vanc; R: Doxy)   - 10/24 BCx (NG)  - 10/26: CT A/P without fluid collections  - 10/27 UCx: mixed  urogenital flora; UA: large leuks, few bacteria     Rx 10/24 Vanc/Cefepime -> 10/25 Ertapenem->10/27 meropenem--> 10/29 Ertapenem      Antimicrobial allergy/intolerance:   Penicillin (see allergy list for nuances)  Valacyclovir - nausea            RECOMMENDATIONS    Management  STOP Meropenem IV  START Ertapenem 500mg  IV daily (renally adjusted dose of 1g daily)    Antimicrobial prophylaxis required for transplant immunosuppression   None    Intensive toxicity monitoring for prescription antimicrobials   CBC w/diff at least once per week  CMP at least once per week  clinical assessments for rashes or other skin changes    Solid Organ Transplant Infectious Diseases Follow-up Instructions  Appointment: 06/28/23 at 11:30am with Dr. Michiel Cowboy  Location: 5th [facility].  [address] [place], [place]  Labs: weekly CBC with differential, CMP  Please fax labs to patient???s transplant coordinator: Celene Squibb at  [phone] (Kidney/Pancreas) and to Michela Pitcher ([facility] ID pharmD at [phone])   Antibiotics:   Ertapenem IV 500mg  daily (renally adjusted dose) Antibiotic End Date: TBD in clinic      The [facility] ID APP service will sign off and arrange outpatient ID follow up.           Please page Darolyn Rua, NP at [phone] from 8-4:30pm, after 4:30 pm & on weekends, please page the ID Transplant/Liquid Oncology Fellow consult at [phone] with questions.    Patient discussed with Dr. [name].      Darolyn Rua, MSN, APRN, AGNP-C  Immunocompromised Infectious Disease Nurse Practitioner    I personally spent 13 minutes face-to-face and non-face-to-face in the care of this patient, which includes all pre, intra, and post visit time on the date of service.  All documented time was specific to the E/M visit and does not include any procedures that may have been performed.      Subjective:     External record(s): Primary team note: reviewed and noted plan to restart mycophenolate.  .    Independent historian(s): no independent historian required.       Interval History:   Afebrile and NAEON. Patient continues to wait for line placement by VIR.     Medications:  Current Medications as of 05/22/2023  Scheduled  PRN   amlodipine, 5 mg, Q12H  atorvastatin, 10 mg, Daily  carvedilol, 25 mg, BID  cholecalciferol (vitamin D3 25 mcg (1,000 units)), 50 mcg, Daily  ertapenem, 500 mg, Q24H  heparin (porcine) for subcutaneous use, 5,000 Units, Q8H SCH  hydrALAZINE, 100 mg, TID  insulin glargine, 6 Units, Nightly  insulin lispro, 2 Units, 3xd Meals  levothyroxine, 112 mcg, daily  melatonin, 6 mg, Nightly  mycophenolate, 250 mg, BID  pantoprazole, 40 mg, Daily  predniSONE, 5 mg, Daily  sodium bicarbonate, 650 mg, TID      acetaminophen, 1,000 mg, Q8H PRN  dextrose in water, 12.5 g, Q10 Min PRN  glucagon, 1 mg, Once PRN  glucose, 16 g, Q10 Min PRN  ondansetron, 4 mg, Q8H PRN         Objective:     Vital Signs last 24 hours:  Temp:  [36.7 ??C (98.1 ??F)-37.1 ??C (98.8 ??F)] 36.7 ??C (98.1 ??F)  Heart Rate:  [61-70] 64  Resp:  [13-19] 16  BP: (155-185)/(44-56) 155/46  MAP (mmHg):  [77-95] 80  SpO2:  [95 %-100 %] 97 %    Physical Exam:   Patient Lines/Drains/Airways Status  Active Active Lines, Drains, & Airways       Name Placement date Placement time Site Days    CVC Single Lumen 05/22/23 Left Internal jugular 05/22/23  1020  Internal jugular  less than 1    Peripheral IV 05/17/23 Right Wrist 05/17/23  1536  Wrist  4    Arteriovenous Fistula - Vein Graft  Access 08/24/16 Left;Upper Arm 08/24/16  --  Arm  2462                  Const [x]  vital signs above    []  NAD, non-toxic appearance []  Chronically ill-appearing, non-distressed        Eyes []  Lids normal bilaterally, conjunctiva anicteric and noninjected OU     [] PERRL  [] EOMI        ENMT []  Normal appearance of external nose and ears, no nasal discharge        []  MMM, no lesions on lips or gums []  No thrush, leukoplakia, oral lesions  []  Dentition good []  Edentulous []  Dental caries present  []  Hearing normal  []  TMs with good light reflexes bilaterally         Neck []  Neck of normal appearance and trachea midline        []  No thyromegaly, nodules, or tenderness   []  Full neck ROM        Lymph []  No LAD in neck     []  No LAD in supraclavicular area     []  No LAD in axillae   []  No LAD in epitrochlear chains []  No LAD in inguinal areas        CV []  RRR            []  No peripheral edema     []  Pedal pulses intact   []  No abnormal heart sounds appreciated   []  Extremities WWP         Resp []  Normal WOB at rest    []  No breathlessness with speaking, no coughing  []  CTA anteriorly    []  CTA posteriorly          GI []  Normal inspection, NTND   []  NABS     []  No umbilical hernia on exam       []  No hepatosplenomegaly     []  Inspection of perineal and perianal areas normal        GU []  Normal external genitalia     [] No urinary catheter present in urethra   []  No CVA tenderness    []  No tenderness over renal allograft        MSK []  No clubbing or cyanosis of hands       []  No vertebral point tenderness  []  No focal tenderness or abnormalities on palpation of joints in RUE, LUE, RLE, or LLE        Skin []  No rashes, lesions, or ulcers of visualized skin     []  Skin warm and dry to palpation         Neuro []  Face expression symmetric  []  Sensation to light touch grossly intact throughout    []  Moves extremities equally    []  No tremor noted        []  CNs II-XII grossly intact     []  DTRs normal and symmetric throughout []  Gait unremarkable        Psych []  Appropriate affect       []  Fluent speech         []   Attentive, good eye contact  []  Oriented to person, place, time          []  Judgment and insight are appropriate           Data for Medical Decision Making     05/17/23 EKG QTcF    I discussed mgm't w/qualified health care professional(s) involved in case: discussed with primary team discharge options including advanced care at home and home health with home infusion and treatment.  .    I reviewed CBC results (Hgb low but similar to prior; Platelets improved and now 139), chemistry results (creatinine improved today at 1.36), and Mag WNL .    I independently visualized/interpreted not done.       Recent Labs     Units 05/19/23  0925 05/20/23  1103 05/21/23  0944 05/22/23  1100   WBC 10*9/L 8.2 6.6   < > 5.6   HGB g/dL 9.4* 9.6*   < > 9.9*   PLT 10*9/L 82* 99*   < > 139*   NEUTROABS 10*9/L 7.3  --   --   --    LYMPHSABS 10*9/L 0.4*  --   --   --    EOSABS 10*9/L 0.0  --   --   --    BUN mg/dL 46* 36*   < > 29*   CREATININE mg/dL 1.61* 0.96*   < > 0.45*   AST U/L 11 11  --   --    ALT U/L 12 11  --   --    BILITOT mg/dL 0.5 0.3  --   --    ALKPHOS U/L 67 64  --   --    K mmol/L 3.4 3.8   < > 4.2   MG mg/dL  --  2.3   < > 2.1   CALCIUM mg/dL 8.2* 8.3*   < > 9.5    < > = values in this interval not displayed.     Microbiology:  Microbiology Results (last day)       Procedure Component Value Date/Time Date/Time    Blood Culture [[id number]]  (Normal) Collected: 05/17/23 1532    Lab Status: Preliminary result Specimen: Blood from 1 Peripheral Draw Updated: 05/21/23 1700     Blood Culture, Routine No Growth at 4 days    Urine Culture [[id number]]  (Abnormal)  (Susceptibility) Collected: 05/17/23 1957    Lab Status: Final result Specimen: Urine from Clean Catch Updated: 05/21/23 1629     Urine Culture, Comprehensive 10,000 to 50,000 CFU/mL Escherichia coli      10,000 to 50,000 CFU/mL Enterococcus faecalis    Narrative:      Specimen Source: Clean Catch    Susceptibility       Escherichia coli (1)       Antibiotic Interpretation Microscan Method Status    Ampicillin Resistant  MIC SUSCEPTIBILITY RESULT Final    Aztreonam Resistant  MIC SUSCEPTIBILITY RESULT Final    Cefazolin Resistant  MIC SUSCEPTIBILITY RESULT Final    Cephalexin Resistant  MIC SUSCEPTIBILITY RESULT Final     For uncomplicated UTI's only.       Cefepime Susceptible-Dose Dependent  MIC SUSCEPTIBILITY RESULT Final     Based on 2g every 8h with extended infusion in adult patients.       Ceftazidime Intermediate  MIC SUSCEPTIBILITY RESULT Final    Ceftriaxone Resistant  MIC SUSCEPTIBILITY RESULT Final    Ciprofloxacin Resistant  MIC SUSCEPTIBILITY RESULT Final    Ertapenem  Susceptible  MIC SUSCEPTIBILITY RESULT Final    Gentamicin Susceptible  MIC SUSCEPTIBILITY RESULT Final    Levofloxacin Intermediate  MIC SUSCEPTIBILITY RESULT Final    Meropenem Susceptible  MIC SUSCEPTIBILITY RESULT Final    Nitrofurantoin Susceptible  MIC SUSCEPTIBILITY RESULT Final    Tetracycline Resistant  MIC SUSCEPTIBILITY RESULT Final     Organisms that test susceptible to tetracycline are considered susceptible to doxycycline.However, some organisms that test intermediate or resistant to tetracycline may be susceptible to doxycycline.       Tobramycin Susceptible  MIC SUSCEPTIBILITY RESULT Final    Trimethoprim + Sulfamethoxazole Resistant  MIC SUSCEPTIBILITY RESULT Final              Enterococcus faecalis (2)       Antibiotic Interpretation Microscan Method Status    Ampicillin Susceptible  KIRBY BAUER Final     For Enterococcus spp., cephalosporins, aminoglycosides, clindamycin and trimethoprim sulfamethoxazole may appear active in vitro but are not effective clinically and are not tested in this laboratory.       Nitrofurantoin Susceptible  KIRBY BAUER Final    Doxycycline Resistant  KIRBY BAUER Final    Vancomycin Screening Plate Susceptible  KIRBY BAUER Final                       Blood Culture [[id number]]  (Normal) Collected: 05/17/23 1511    Lab Status: Preliminary result Specimen: Blood from 1 Peripheral Draw Updated: 05/21/23 1545     Blood Culture, Routine No Growth at 4 days              Imaging:  No new imaging

## 2023-05-22 NOTE — Unmapped (Signed)
Transplant Nephrology Consult     Requesting Attending Physician :  Julie Brooklyn, MD  Service Requesting Consult : Med General Julie [name] ([facility])  Reason for Consult: immunosuppression management    Assessment and Plan:    # S/p Kidney Transplant 08/06/2022, Kidney allograft function:  # AKI  - Graft function: creatinine previously 0.9-1, now 1-1.3  - DSAs: not present as of 09/06/22  - Early post-txp course notable for multiple readmissions for UTI, ileus/SBO due to volvulus, and for resistant hypertension  - Serum creatinine is 1.36, continues to improve closer to more recent baseline prior to admission    # Immunosuppression:  - Belatacept 5mg /kg q2 weeks (last dose 10/17), Mycophenolate 250 mg bid, prednisone 5 mg daily    # Blood Pressure / Volume:  - Goal BP <130/80  - current meds: amlodipine 5 mg bid, carvedilol 25 mg bid, chlorthalidone 50 mg daily, hydralazine 100 mg daily, spironolactone 25 mg daily    # Infectious Prophylaxis and Monitoring:  # E.coli and E. Faecalis UTI   CMV D+/R+, EBV D+/R+  - h/o Frequent UTIs (ESBL E coli) driving 5 post-transplant admissions. 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 Follows with [facility] outpatient.   - previously on IV Ertapenem that ended around 9/13 w/ tunneled CVC removed on 9/16              - prior CT scan on 9/12 showed resolution of fluid collection              - ID to have started on ppx fosfomycin              - was supposed to urology on 08/08 but cancelled by patient   - previously on nitrofurantoin for ppx, methenamine cost-prohibitive  - abx per [facility] recommendations    RECOMMENDATIONS:   - Continue current medications  - Resume home anti-hypertensives  - Transplant patients with an open wound require wound care with sterile water only. The patient should be counseled on this at the time of discharge if they have not already been doing this.  - We will continue to follow.     Julie Keeler, DO  05/22/2023 1:22 PM Medical decision-making for 05/22/23  Findings / Data     Patient has: []  acute illness w/systemic sxs  [mod]  []  two or more stable chronic illnesses [mod]  []  one chronic illness with acute exacerbation [mod]  []  acute complicated illness  [mod]  []  Undiagnosed new problem with uncertain prognosis  [mod] [x]  illness posing risk to life or bodily function (ex. AKI)  [high]  []  chronic illness with severe exacerbation/progression  [high]  []  chronic illness with severe side effects of treatment  [high] AKI Probs At least 2:  Probs, Data, Risk   I reviewed: [x]  primary team note  []  consultant note(s)  []  external records [x]  chemistry results  [x]  CBC results  []  blood gas results  []  Other []  procedure/op note(s)   []  radiology report(s)  []  micro result(s)  []  w/ independent historian(s) Elevated Scr >=3 Data Review (2 of 3)    I independently interpreted: []  Urine Sediment  []  Renal US []  CXR Images  []  CT Images  []  Other []  EKG Tracing  Any     I discussed: []  Pathology results w/ QHPs(s) from other specialties  []  Procedural findings w/ QHPs(s) from other specialties []  Imaging w/ QHP(s) from other specialties  [x]  Treatment plan w/ QHP(s) from  other specialties Plan discussed with primary team Any     Mgm't requires: []  Prescription drug(s)  [mod]  []  Kidney biopsy  [mod]  []  Central line placement  [mod] [x]  High risk medication use and/or intensive toxicity monitoring [high]  []  Renal replacement therapy [high]  []  High risk kidney biopsy  [high]  []  Escalation of care  [high]  []  High risk central line placement  [high] Immunosuppression: high risk for infection Risk      _______________________________________________________    Transplant Background  Date of Transplant: 08/06/2022 (Kidney)  Type of Transplant: DCD  KDPI: 56%  Cold ischemic time: 877 minutes (14 hr 37 min)  Warm ischemic time: 32 minutes  cPRA: 0%  HLA match:   Blood type: Donor A1, Recipient A POS  ID: CMV D+/R+, EBV D+/R+  Native Kidney Disease: Diabetes              Native kidney biopsy: Diabetic nephropathy (done elsewhere)              Pre-transplant dialysis course: PD started July 2017, transitioned to HD October 2019 due to catheter dysfunction/inadequate dialysis  Prior Transplants: None  Induction: Basiliximab  Early steroid withdrawal: No     Biopsies:   None     History of Present Illness:  Julie [name] is a/an 72 y.o. 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 Nephrology has been consulted for DDKT.      History is obtained from husband over the phone. According to him, both he and Julie [name] received their flu + COVID vaccine this past Monday. However, patient started to have low-grade fevers around 99.4 F 2 days later. Yesterday, patient started to have recurrent fevers that improved with Tylenol up to 102.7 F. Associated symptoms during this time include: vomiting & loose stool (husband reports this started when at the ED). No URI symptoms, cough, LAD, abdominal pain, GU, rashes, arthralgia, or meningeal signs. Patient does have a LUE AVF but no concerning features for infection. Patient has not traveled recently but did recently return to church this past Sunday for the first time since her transplant. Did not report any sick exposures. Appetite has been good as have BP with average being between 140-150s/40-50s per husband. No NSAID use.    Interval Hx: Patient seen and examined at bedside in NAD. No acute events overnight. Feeling much better today. Is s/p tunneled SL powerline.     Medications:  Current Facility-Administered Medications:     acetaminophen (TYLENOL) tablet 1,000 mg, Oral, Q8H PRN    amlodipine (NORVASC) tablet 5 mg, Oral, Q12H    atorvastatin (LIPITOR) tablet 10 mg, Oral, Daily    carvedilol (COREG) tablet 25 mg, Oral, BID cholecalciferol (vitamin D3 25 mcg (1,000 units)) tablet 50 mcg, Oral, Daily    dextrose (D10W) 10% bolus 125 mL, Intravenous, Q10 Min PRN    ertapenem (INVANZ) 500 mg in sodium chloride (NS) 0.9 % 50 mL IVPB, Intravenous, Q24H    glucagon injection 1 mg, Intramuscular, Once PRN    glucose chewable tablet 16 g, Oral, Q10 Min PRN    heparin (porcine) 5,000 unit/mL injection 5,000 Units, Subcutaneous, Q8H SCH    hydrALAZINE (APRESOLINE) tablet 100 mg, Oral, TID    insulin glargine (LANTUS) injection 6 Units, Subcutaneous, Nightly    insulin lispro (  HumaLOG) injection 2 Units, Subcutaneous, 3xd Meals    levothyroxine (SYNTHROID) tablet 112 mcg, Oral, daily    melatonin tablet 6 mg, Oral, Nightly    mycophenolate (CELLCEPT) capsule 250 mg, Oral, BID    ondansetron (ZOFRAN) injection 4 mg, Intravenous, Q8H PRN    pantoprazole (Protonix) EC tablet 40 mg, Oral, Daily    predniSONE (DELTASONE) tablet 5 mg, Oral, Daily    sodium bicarbonate tablet 650 mg, Oral, TID    Prior to Admission medications    Medication Dose, Route, Frequency   amlodipine (NORVASC) 5 MG tablet 5 mg, Oral, Every 12 hours   atorvastatin (LIPITOR) 10 MG tablet 10 mg, Oral, Daily (standard)   carvedilol (COREG) 25 MG tablet 25 mg, Oral, 2 times a day (standard)   chlorthalidone (HYGROTON) 25 MG tablet 50 mg, Oral, Daily (standard)   estradiol (ESTRACE) 0.01 % (0.1 mg/gram) vaginal cream 2 g, Vaginal, Nightly   hydrALAZINE (APRESOLINE) 100 MG tablet 100 mg, Oral, 3 times a day (standard)   insulin glargine (BASAGLAR, LANTUS) 100 unit/mL (3 mL) injection pen 5 Units, Subcutaneous, Nightly   levothyroxine (SYNTHROID) 112 MCG tablet 112 mcg, Oral, Daily (standard)   magnesium oxide (MAG-OX) 400 mg (241.3 mg elemental magnesium) tablet 400 mg, Oral, 2 times a day (standard)   mycophenolate (CELLCEPT) 250 mg capsule 250 mg, Oral, 2 times a day (standard)   pantoprazole (PROTONIX) 40 MG tablet 40 mg, Oral, Daily PRN   predniSONE (DELTASONE) 5 MG tablet 5 mg, Oral, Daily   sodium bicarbonate 650 mg tablet 650 mg, Oral, 3 times a day (standard)   spironolactone (ALDACTONE) 25 MG tablet 25 mg, Oral, Daily (standard)   alendronate (FOSAMAX) 70 MG tablet 70 mg, Oral, Every 7 days   blood-glucose sensor (DEXCOM G7 SENSOR) Devi Use 1 sensor every 10 days.   cholecalciferol, vitamin D3 25 mcg, 1,000 units,, (CHOLECALCIFEROL-25 MCG, 1,000 UNIT,) 1,000 unit (25 mcg) tablet 50 mcg, Oral, Daily (standard)   cranberry fruit extract (CRANBERRY CONCENTRATE ORAL) 1 capsule, Oral, Daily   cyanocobalamin, vitamin B-12, 1000 MCG tablet 1,000 mcg, Oral, Daily (standard)   D-MANNOSE ORAL 2,000 mg, Oral, Daily   insulin aspart (NOVOLOG) 100 unit/mL vial 3-5 Units, Subcutaneous, 3 times a day (AC)   lancets Misc Use to check blood sugar as directed with insulin 3 times a day & for symptoms of high or low blood sugar.   melatonin 5 mg tablet 5 mg, Oral, At bedtime   nitrofurantoin, macrocrystal-monohydrate, (MACROBID) capsule 100 mg, Oral, Once      Allergies:  Gabapentin and Valacyclovir    Medical History:  Past Medical History:   Diagnosis Date    Acute metabolic acidosis 01/29/2023    Acute metabolic encephalopathy 01/29/2023    AKI (acute kidney injury) (CMS-HCC) 01/29/2023    Anemia     Anemia due to stage 5 chronic kidney disease, not on chronic dialysis (CMS-HCC) 08/08/2022    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)     since July 2017    Hypertension     Hypothyroidism (acquired)     Kidney transplant status, cadaveric 08/08/2022    Type 2 diabetes mellitus (CMS-HCC)      Past Surgical History:   Procedure Laterality Date    CATARACT EXTRACTION      HYSTERECTOMY      OOPHORECTOMY      PERITONEAL CATHETER INSERTION      PR LAP INSERTION TUNNELED INTRAPERITONEAL CATHETER N/A 09/27/2018    Procedure: LAPAROSCOPY, SURGICAL; WITH  INSERTION OF INTRAPERITONEAL CANNULA OR CATHETER, PERMANENT;  Surgeon: Leona Carry, MD;  Location: MAIN OR [facility];  Service: Transplant    PR LAP INSERTION TUNNELED INTRAPERITONEAL CATHETER N/A 04/08/2019    Procedure: LAPAROSCOPY, SURGICAL; WITH INSERTION OF INTRAPERITONEAL CANNULA OR CATHETER, PERMANENT;  Surgeon: Leona Carry, MD;  Location: MAIN OR [facility];  Service: Transplant    PR LAP REVISE INTRAPERITONEAL CATHETER N/A 08/26/2019    Procedure: LAPAROSCOPY, SURGICAL; W/REVIS PREV PLACED INTRAPERITONEAL CANNULA/CATH, REMOV INTRALUMIN OBSTRUCT MATERIAL;  Surgeon: Leona Carry, MD;  Location: MAIN OR [facility];  Service: Transplant    PR REDUCE VOLVULUS,INTUSS,INTERN HERNIA N/A 10/13/2022    Procedure: REDUCTION OF VOLVULUS, INTUSSUSCEPTION, INTERNAL HERNIA, BY LAPAROTOMY;  Surgeon: Suella Broad, MD;  Location: MAIN OR [place];  Service: Trauma    PR REMOVAL TUNNELED INTRAPERITONEAL CATHETER N/A 05/06/2018    Procedure: REMOVAL OF PERMANENT INTRAPERITONEAL CANNULA OR CATHETER;  Surgeon: Leona Carry, MD;  [facility];  Service: Transplant    PR REMOVE PERITONEAL FOREIGN BODY N/A 04/08/2019    Procedure: Removal Of Peritoneal Of Foreign Body From Peritoneal Cavity;  Surgeon: Leona Carry, MD;  Location: MAIN OR [facility];  Service: Transplant    PR TRANSPLANT,PREP RENAL GRAFT/ARTERIAL Right 08/06/2022    Procedure: [facility] RECONSTRUCTION CADAVER/LIVING DONOR RENAL ALLOGRAFT PRIOR TO TRANSPLANT; ARTERIAL ANASTOMOSIS EAC;  Surgeon: Toledo, Lilyan Punt, MD;  Location: MAIN OR [facility];  Service: Transplant    PR TRANSPLANTATION OF KIDNEY Right 08/06/2022    Procedure: RENAL ALLOTRANSPLANTATION, IMPLANTATION OF GRAFT; WITHOUT RECIPIENT NEPHRECTOMY;  Surgeon: Toledo, Lilyan Punt, MD;  Location: MAIN OR [facility];  Service: Transplant    TUBAL LIGATION       Social History:  Social History     Social History Narrative    Lives in [place] with her husband. Moved from [place] to the [place] when she was around 19.      reports that she quit smoking about 16 years ago. Her smoking use included cigarettes. She started smoking about 46 years ago. She has a 22.5 pack-year smoking history. She has quit using smokeless tobacco. She reports current alcohol use. She reports that she does not use drugs.     Family History:  Family History   Problem Relation Age of Onset    Diabetes Mother     Alzheimer's disease Father     Diabetes Sister     Diabetes Sister     Diabetes Sister     Anesthesia problems Neg Hx     Bleeding Disorder Neg Hx       Physical Exam:   Vitals:    05/22/23 1015 05/22/23 1020 05/22/23 1025 05/22/23 1030   BP: 156/49 155/50 155/45 155/46   Pulse: 62 62 63 64   Resp: 13 17 14 16    Temp:       TempSrc:       SpO2: 100% 100% 98% 97%   Weight:       Height:         No intake/output data recorded.    Intake/Output Summary (Last 24 hours) at 05/22/2023 1322  Last data filed at 05/22/2023 0600  Gross per 24 hour   Intake 240 ml   Output 0 ml   Net 240 ml     Constitutional: ill-appearing, no acute distress  Heart: regular rate and rhythm, no murmurs, rubs, or gallops  Lungs: clear to auscultation bilaterally without adventitious sounds  Abd: soft, non-tender, non-distended  Ext: no  lower extremity edema

## 2023-05-22 NOTE — Unmapped (Signed)
Immunocompromised Host ID OPAT:     **All orders are to be signed by primary team. [facility] does not sign any orders for home health or home infusion**    Solid Organ Transplant Infectious Diseases Follow-up Instructions  Appointment: 06/28/23 at 11:30am with Dr. Michiel Cowboy  Location: 5th [facility].  [address], [place], [place]  Labs: weekly CBC with differential, CMP  Please fax labs to patient???s transplant coordinator: Celene Squibb at  [phone] (Kidney/Pancreas) and to Michela Pitcher ([facility] ID pharmD at [phone])   Antibiotics:   Ertapenem IV 500mg  daily (renally adjusted dose)Antibiotic End Date: TBD in clinic

## 2023-05-23 LAB — CBC
HEMATOCRIT: 28.3 % — ABNORMAL LOW (ref 34.0–44.0)
HEMOGLOBIN: 9.7 g/dL — ABNORMAL LOW (ref 11.3–14.9)
MEAN CORPUSCULAR HEMOGLOBIN CONC: 34.3 g/dL (ref 32.0–36.0)
MEAN CORPUSCULAR HEMOGLOBIN: 32.2 pg (ref 25.9–32.4)
MEAN CORPUSCULAR VOLUME: 93.7 fL (ref 77.6–95.7)
MEAN PLATELET VOLUME: 7.5 fL (ref 6.8–10.7)
PLATELET COUNT: 159 10*9/L (ref 150–450)
RED BLOOD CELL COUNT: 3.02 10*12/L — ABNORMAL LOW (ref 3.95–5.13)
RED CELL DISTRIBUTION WIDTH: 13.9 % (ref 12.2–15.2)
WBC ADJUSTED: 5 10*9/L (ref 3.6–11.2)

## 2023-05-23 LAB — BASIC METABOLIC PANEL
ANION GAP: 7 mmol/L (ref 5–14)
BLOOD UREA NITROGEN: 31 mg/dL — ABNORMAL HIGH (ref 9–23)
BUN / CREAT RATIO: 24
CALCIUM: 9.3 mg/dL (ref 8.7–10.4)
CHLORIDE: 104 mmol/L (ref 98–107)
CO2: 28 mmol/L (ref 20.0–31.0)
CREATININE: 1.27 mg/dL — ABNORMAL HIGH
EGFR CKD-EPI (2021) FEMALE: 45 mL/min/{1.73_m2} — ABNORMAL LOW (ref >=60)
GLUCOSE RANDOM: 143 mg/dL (ref 70–179)
POTASSIUM: 4.4 mmol/L (ref 3.4–4.8)
SODIUM: 139 mmol/L (ref 135–145)

## 2023-05-23 LAB — MAGNESIUM: MAGNESIUM: 1.8 mg/dL (ref 1.6–2.6)

## 2023-05-23 MED ORDER — INSULIN GLARGINE (U-100) 100 UNIT/ML (3 ML) SUBCUTANEOUS PEN
Freq: Every evening | SUBCUTANEOUS | 0 refills | 50.00000 days | Status: CP
Start: 2023-05-23 — End: ?

## 2023-05-23 MED ORDER — PANTOPRAZOLE 40 MG TABLET,DELAYED RELEASE
ORAL_TABLET | Freq: Every day | ORAL | 0 refills | 30.00000 days | Status: CP
Start: 2023-05-23 — End: ?

## 2023-05-23 MED ADMIN — pantoprazole (Protonix) EC tablet 40 mg: 40 mg | ORAL | @ 12:00:00 | Stop: 2023-05-23

## 2023-05-23 MED ADMIN — heparin (porcine) 5,000 unit/mL injection 5,000 Units: 5000 [IU] | SUBCUTANEOUS | @ 02:00:00

## 2023-05-23 MED ADMIN — hydrALAZINE (APRESOLINE) tablet 100 mg: 100 mg | ORAL | @ 12:00:00 | Stop: 2023-05-23

## 2023-05-23 MED ADMIN — cholecalciferol (vitamin D3 25 mcg (1,000 units)) tablet 50 mcg: 50 ug | ORAL | @ 12:00:00 | Stop: 2023-05-23

## 2023-05-23 MED ADMIN — carvedilol (COREG) tablet 25 mg: 25 mg | ORAL | @ 12:00:00 | Stop: 2023-05-23

## 2023-05-23 MED ADMIN — melatonin tablet 6 mg: 6 mg | ORAL | @ 02:00:00

## 2023-05-23 MED ADMIN — sodium bicarbonate tablet 650 mg: 650 mg | ORAL | @ 02:00:00

## 2023-05-23 MED ADMIN — insulin lispro (HumaLOG) injection 2 Units: 2 [IU] | SUBCUTANEOUS | @ 17:00:00 | Stop: 2023-05-23

## 2023-05-23 MED ADMIN — atorvastatin (LIPITOR) tablet 10 mg: 10 mg | ORAL | @ 12:00:00 | Stop: 2023-05-23

## 2023-05-23 MED ADMIN — mycophenolate (CELLCEPT) capsule 250 mg: 250 mg | ORAL | @ 12:00:00 | Stop: 2023-05-23

## 2023-05-23 MED ADMIN — amlodipine (NORVASC) tablet 5 mg: 5 mg | ORAL | @ 12:00:00 | Stop: 2023-05-23

## 2023-05-23 MED ADMIN — sodium bicarbonate tablet 650 mg: 650 mg | ORAL | @ 12:00:00 | Stop: 2023-05-23

## 2023-05-23 MED ADMIN — heparin (porcine) 5,000 unit/mL injection 5,000 Units: 5000 [IU] | SUBCUTANEOUS | @ 10:00:00 | Stop: 2023-05-23

## 2023-05-23 MED ADMIN — ertapenem (INVANZ) 500 mg in sodium chloride (NS) 0.9 % 50 mL IVPB: 500 mg | INTRAVENOUS | @ 17:00:00 | Stop: 2023-05-23

## 2023-05-23 MED ADMIN — insulin glargine (LANTUS) injection 6 Units: 6 [IU] | SUBCUTANEOUS | @ 02:00:00

## 2023-05-23 MED ADMIN — predniSONE (DELTASONE) tablet 5 mg: 5 mg | ORAL | @ 12:00:00 | Stop: 2023-05-23

## 2023-05-23 MED ADMIN — levothyroxine (SYNTHROID) tablet 112 mcg: 112 ug | ORAL | @ 10:00:00 | Stop: 2023-05-23

## 2023-05-23 MED ADMIN — mycophenolate (CELLCEPT) capsule 250 mg: 250 mg | ORAL | @ 02:00:00

## 2023-05-23 MED ADMIN — insulin lispro (HumaLOG) injection 2 Units: 2 [IU] | SUBCUTANEOUS | @ 12:00:00 | Stop: 2023-05-23

## 2023-05-23 MED ADMIN — amlodipine (NORVASC) tablet 5 mg: 5 mg | ORAL | @ 02:00:00

## 2023-05-23 MED ADMIN — acetaminophen (TYLENOL) tablet 1,000 mg: 1000 mg | ORAL | @ 02:00:00

## 2023-05-23 MED ADMIN — carvedilol (COREG) tablet 25 mg: 25 mg | ORAL | @ 02:00:00

## 2023-05-23 MED ADMIN — hydrALAZINE (APRESOLINE) tablet 100 mg: 100 mg | ORAL | @ 02:00:00

## 2023-05-23 NOTE — Unmapped (Signed)
Pt  remained A & O x 4 and able to express needs .  VSS , afebrile , room air . Pt voiced no complaints , denied pain 0/10 , no N/V , no SOB/dyspnea . Pt tolerated scheduled HS meds well . Assistance with ADLS provided . Pt OOB to BR with minimal assistance , gait slightly unsteady . Pt  able to turn and repositioned self in bed for comfort . Falls and safety precautions reinforced with bed low and locked , SR up x 3 , call bell within reach , bed alarm on . Pt resting quietly in bed with eyes closed , no s/sx of acute distress noted . Will continue with current POC . Spouse @ the bedside and supportive .    Problem: Adult Inpatient Plan of Care  Goal: Plan of Care Review  Outcome: Progressing  Goal: Patient-Specific Goal (Individualized)  Outcome: Progressing  Flowsheets (Taken 05/23/2023 0623)  Patient/Family-Specific Goals (Include Timeframe): Pt will remain free from falls and injuries during this shift 7p - 7a .  Individualized Care Needs: V/S , labs , ACHS , pain mgmt , CVC line , falls/safety precautions .  Anxieties, Fears or Concerns: Pt voiced no concerns .  Goal: Absence of Hospital-Acquired Illness or Injury  Outcome: Progressing  Intervention: Identify and Manage Fall Risk  Recent Flowsheet Documentation  Taken 05/23/2023 0600 by Harlow Ohms, RN  Safety Interventions:   fall reduction program maintained   environmental modification   low bed   family at bedside   isolation precautions   nonskid shoes/slippers when out of bed   room near unit station  Taken 05/23/2023 0400 by Harlow Ohms, RN  Safety Interventions:   fall reduction program maintained   environmental modification   low bed   family at bedside   isolation precautions   nonskid shoes/slippers when out of bed   room near unit station  Taken 05/23/2023 0200 by Harlow Ohms, RN  Safety Interventions:   fall reduction program maintained   environmental modification   family at bedside   isolation precautions   low bed nonskid shoes/slippers when out of bed  Taken 05/23/2023 0000 by Harlow Ohms, RN  Safety Interventions:   fall reduction program maintained   environmental modification   low bed   nonskid shoes/slippers when out of bed   family at bedside   isolation precautions  Taken 05/22/2023 2200 by Harlow Ohms, RN  Safety Interventions:   fall reduction program maintained   environmental modification   low bed   family at bedside   isolation precautions   nonskid shoes/slippers when out of bed  Taken 05/22/2023 2000 by Harlow Ohms, RN  Safety Interventions:   fall reduction program maintained   environmental modification   low bed   nonskid shoes/slippers when out of bed   family at bedside  Intervention: Prevent Skin Injury  Recent Flowsheet Documentation  Taken 05/22/2023 2000 by Harlow Ohms, RN  Positioning for Skin: Supine/Back  Device Skin Pressure Protection: absorbent pad utilized/changed  Skin Protection: incontinence pads utilized  Intervention: Prevent and Manage VTE (Venous Thromboembolism) Risk  Recent Flowsheet Documentation  Taken 05/23/2023 0600 by Harlow Ohms, RN  Anti-Embolism Intervention: Other (Comment)  Taken 05/23/2023 0400 by Harlow Ohms, RN  Anti-Embolism Intervention: Other (Comment)  Taken 05/23/2023 0200 by Harlow Ohms, RN  Anti-Embolism Intervention: Other (Comment)  Taken 05/23/2023 0000 by Harlow Ohms, RN  Anti-Embolism Intervention: Other (Comment)  Taken 05/22/2023 2200 by Harlow Ohms, RN  Anti-Embolism Intervention: Other (Comment)  Taken 05/22/2023 2000 by Harlow Ohms, RN  VTE Prevention/Management: (VTE : Heparin SQ) anticoagulant therapy  Anti-Embolism Intervention: Other (Comment)  Intervention: Prevent Infection  Recent Flowsheet Documentation  Taken 05/23/2023 0600 by Harlow Ohms, RN  Infection Prevention: rest/sleep promoted  Taken 05/23/2023 0400 by Harlow Ohms, RN  Infection Prevention: rest/sleep promoted  Taken 05/23/2023 0200 by Harlow Ohms, RN  Infection Prevention: rest/sleep promoted  Taken 05/23/2023 0000 by Harlow Ohms, RN  Infection Prevention: rest/sleep promoted  Taken 05/22/2023 2000 by Harlow Ohms, RN  Infection Prevention: environmental surveillance performed  Goal: Optimal Comfort and Wellbeing  Outcome: Progressing  Goal: Readiness for Transition of Care  Outcome: Progressing  Goal: Rounds/Family Conference  Outcome: Progressing

## 2023-05-23 NOTE — Unmapped (Signed)
Physician Discharge Summary [facility]  [address] [facility]  [address]  [place] [address]  Dept: [phone]  Loc: [phone]     Identifying Information:   Julie [name]  October 30, 1950  [id number]    Primary Care Physician: Barbette Reichmann, MD     Code Status: Full Code    Admit Date: 05/17/2023    Discharge Date: 05/23/2023     Discharge To: Home with Home Health and/or PT/OT    Discharge Service: [facility] - General Medicine Floor Team (MED Aggie Hacker)     Discharge Attending Physician: Mauri Brooklyn, MD    Discharge Diagnoses:  Principal Problem:    UTI (urinary tract infection) (POA: Yes)  Active Problems:    Type 2 diabetes mellitus with diabetic nephropathy, with long-term current use of insulin (CMS-HCC) (POA: Not Applicable)    Hypothyroidism, acquired (POA: Yes)    Kidney transplant recipient (POA: Not Applicable)    Immunosuppressed status (CMS-HCC) (POA: Yes)    Primary hypertension (POA: Yes)    Mixed hyperlipidemia (POA: Yes)    Chronic idiopathic thrombocytopenia (CMS-HCC) (POA: Yes)    AKI (acute kidney injury) (CMS-HCC) (POA: Yes)    Hyponatremia (POA: Yes)  Resolved Problems:    * No resolved hospital problems. *      Outpatient Provider Follow Up Issues:   Outpatient:  [ ]  Outpatient follow up of new AS, HFpEF   [ ]  Solid Organ Transplant Infectious Diseases Follow-up 06/28/23 at 11:30am with Dr. Michiel Cowboy. Will continue IV ertapenem until follow-up appt  [ ] ID to arrange line removal  [ ]  consider starting methenamine after IV antibitoics    Hospital Course:       Julie [name] is a 72 y.o. F with history of ESRD s/p DDKT 07/2022, recurrent MDR E. coli UTIs who presented with fever, fatigue, and chills likely due to UTI.  Hospital course by problem below.     Fever I Chills I Fatigue  History of Recurrent MDR E. Coli UTIs  Patient presented with 3 day history of fever, chills, and 3 episodes of NBNB emesis in ED. ED work-up notable for WBC 12.8, Plt 100, BUN 35, Cr 1.44. UA with large LE, >182 WBC, 8 RBC, moderate bacteria. Patient has had multiple MDR E. coli UTIs in the past, which which were susceptible to ertapenem. .  Urine culture positive for E. coli, E faecalis; susceptibility to carbopenems .  Immunocompromised infectious disease consulted.  Patient treated with ertapenem daily (10/25-10/26), meropenem (10/27-) and will now dc with home infusion and ertapenem for likely 4 weeks with ID to determine final date and will consider methenamine after IV antibiotics completed.      Perinephric Fluid Collection  [place] identified perinephric collection 10/05/22 (8.1 x 0.8 x 2.3 cm) s/p IR guided drainage with negative culture and Cr. 1.5. Subsequent CTAP (7.4 x 1.0 x 5.4 cm) crescentic fluid collection within the soft tissues lateral to the transplant kidney that was unable to be aspirated with VIR. CTAP (04/05/23) with resolution of perinephric fluid collection.  CT abdomen pelvis without contrast on 10/26 showed no perinephric fluid collection or other acute process.     AKI on DDRT 07/2022  Baseline Cr 1-1.3. Peak Cr 2.32 during admission likely due to prerenal AKI from decreased oral intake and infection.  Transplant nephrology consulted.  She was managed with 1L IVF and started on oral rehydration protocol. Renal transplant [place] (10/24) with patent vasculature and improved resistive indices.  Workup notable for UPCR 1.  Resolved with treatment of infection.     Systolic murmur - elevated proBNP  Patient has a chronic systolic murmur.  proBNP elevated to around 8000 during hospital stay.  Echocardiogram showed EF greater than 55%, grade 2 diastolic dysfunction, mild to moderate aortic stenosis. Can consider further outpatient workup with transplant team to guide and consider cardiology follow up.     Chronic conditions  -Hypertension: Continued amlodipine 5 mg, Coreg 25 mg twice daily, hydralazine 100 mg 3 times daily.  Held chlorthalidone in the setting of acute kidney injury but will restart for home given improvement in creatinine.   -Hyperlipidemia: Continued Lipitor 10 mg daily.  -Diabetes: Continue Lantus with increase from home 5 units to 6units nightly.  Continue home mealtime lispro.   -Hypothyroidism: Continue with Synthyroid 112 mcg daily.  -GERD: Continued Protonix 40 mg daily.        Touchbase with Outpatient Provider:  Warm Handoff: Completed on 05/22/23 by Mervin Kung, ANP  (APP) via Phone    Procedures:  Tunneled line by VIR  No admission procedures for hospital encounter.  ______________________________________________________________________  Discharge Medications:     Your Medication List        STOP taking these medications      nitrofurantoin (macrocrystal-monohydrate) capsule  Commonly known as: MACROBID            START taking these medications      ertapenem 500 mg, OVERFILL 7 mL in sodium chloride 0.9 % 50 mL IVPB  Infuse 500 mg into a venous catheter daily. Tentatively 30 days but final duration to be determined in ID clinic            CHANGE how you take these medications      insulin glargine 100 unit/mL (3 mL) injection pen  Commonly known as: BASAGLAR, LANTUS  Inject 0.06 mL (6 Units total) under the skin nightly.  What changed: how much to take     pantoprazole 40 MG tablet  Commonly known as: Protonix  Take 1 tablet (40 mg total) by mouth daily.  What changed:   when to take this  reasons to take this            CONTINUE taking these medications      ACCU-CHEK SOFTCLIX LANCETS lancets  Generic drug: lancets  Use to check blood sugar as directed with insulin 3 times a day & for symptoms of high or low blood sugar.     alendronate 70 MG tablet  Commonly known as: FOSAMAX  Take 1 tablet (70 mg total) by mouth every seven (7) days.     amlodipine 5 MG tablet  Commonly known as: NORVASC  Take 1 tablet (5 mg total) by mouth every twelve (12) hours.     atorvastatin 10 MG tablet  Commonly known as: LIPITOR  Take 1 tablet (10 mg total) by mouth daily.     carvedilol 25 MG tablet  Commonly known as: COREG  Take 1 tablet (25 mg total) by mouth two (2) times a day.     chlorthalidone 25 MG tablet  Commonly known as: HYGROTON  Take 2 tablets (50 mg total) by mouth daily.     cholecalciferol-25 mcg (1,000 unit) 1,000 unit (25 mcg) tablet  Generic drug: cholecalciferol (vitamin D3 25 mcg (1,000 units))  Take 2 tablets (50 mcg total) by mouth daily.     CRANBERRY CONCENTRATE ORAL  Take 1 capsule by mouth in the morning.  cyanocobalamin (vitamin B-12) 1000 MCG tablet  Take 1 tablet (1,000 mcg total) by mouth daily.     D-MANNOSE ORAL  Take 2,000 mg by mouth in the morning.     DEXCOM G7 SENSOR Devi  Generic drug: blood-glucose sensor  Use 1 sensor every 10 days.     estradiol 0.01 % (0.1 mg/gram) vaginal cream  Commonly known as: ESTRACE  Insert 2 g into the vagina nightly.     hydrALAZINE 100 MG tablet  Commonly known as: APRESOLINE  Take 1 tablet (100 mg total) by mouth Three (3) times a day.     insulin aspart 100 unit/mL vial  Commonly known as: NovoLOG  Inject 0.03-0.05 mL (3-5 Units total) under the skin Three (3) times a day before meals.     levothyroxine 112 MCG tablet  Commonly known as: SYNTHROID  Take 1 tablet (112 mcg total) by mouth daily.     magnesium oxide 400 mg (241.3 mg elemental) tablet  Commonly known as: MAG-OX  Take 1 tablet (400 mg total) by mouth two (2) times a day.     melatonin 5 mg tablet  Take 1 tablet (5 mg total) by mouth at bedtime.     mycophenolate 250 mg capsule  Commonly known as: CELLCEPT  Take 1 capsule (250 mg total) by mouth two (2) times a day.     predniSONE 5 MG tablet  Commonly known as: DELTASONE  Take 1 tablet (5 mg total) by mouth in the morning.     sodium bicarbonate 650 mg tablet  Take 1 tablet (650 mg total) by mouth Three (3) times a day.     spironolactone 25 MG tablet  Commonly known as: ALDACTONE  Take 1 tablet (25 mg total) by mouth daily.              Allergies:  Gabapentin and Valacyclovir  ______________________________________________________________________  Pending Test Results (if blank, then none):      Most Recent Labs:  All lab results last 24 hours -   Recent Results (from the past 24 hour(s))   POCT Glucose    Collection Time: 05/22/23  4:54 PM   Result Value Ref Range    Glucose, POC 281 (H) 70 - 179 mg/dL   POCT Glucose    Collection Time: 05/22/23  9:30 PM   Result Value Ref Range    Glucose, POC 176 70 - 179 mg/dL   POCT Glucose    Collection Time: 05/23/23  7:41 AM   Result Value Ref Range    Glucose, POC 147 70 - 179 mg/dL   CBC    Collection Time: 05/23/23  7:54 AM   Result Value Ref Range    WBC 5.0 3.6 - 11.2 10*9/L    RBC 3.02 (L) 3.95 - 5.13 10*12/L    HGB 9.7 (L) 11.3 - 14.9 g/dL    HCT 56.2 (L) 13.0 - 44.0 %    MCV 93.7 77.6 - 95.7 fL    MCH 32.2 25.9 - 32.4 pg    MCHC 34.3 32.0 - 36.0 g/dL    RDW 86.5 78.4 - 69.6 %    MPV 7.5 6.8 - 10.7 fL    Platelet 159 150 - 450 10*9/L   Magnesium Level    Collection Time: 05/23/23  7:54 AM   Result Value Ref Range    Magnesium 1.8 1.6 - 2.6 mg/dL   Basic Metabolic Panel    Collection Time: 05/23/23  7:54 AM  Result Value Ref Range    Sodium 139 135 - 145 mmol/L    Potassium 4.4 3.4 - 4.8 mmol/L    Chloride 104 98 - 107 mmol/L    CO2 28.0 20.0 - 31.0 mmol/L    Anion Gap 7 5 - 14 mmol/L    BUN 31 (H) 9 - 23 mg/dL    Creatinine 5.78 (H) 0.55 - 1.02 mg/dL    BUN/Creatinine Ratio 24     eGFR CKD-EPI (2021) Female 45 (L) >=60 mL/min/1.64m2    Glucose 143 70 - 179 mg/dL    Calcium 9.3 8.7 - 46.9 mg/dL   POCT Glucose    Collection Time: 05/23/23 12:01 PM   Result Value Ref Range    Glucose, POC 159 70 - 179 mg/dL     Microbiology -   Microbiology Results (last day)       Procedure Component Value Date/Time Date/Time    Blood Culture [[id number]]  (Normal) Collected: 05/17/23 1532    Lab Status: Final result Specimen: Blood from 1 Peripheral Draw Updated: 05/22/23 1700     Blood Culture, Routine No Growth at 5 days    Blood Culture [[id number]]  (Normal) Collected: 05/17/23 1511    Lab Status: Final result Specimen: Blood from 1 Peripheral Draw Updated: 05/22/23 1545     Blood Culture, Routine No Growth at 5 days            Relevant Studies/Radiology (if blank, then none):  No results found.  ______________________________________________________________________  Discharge Instructions:   Activity Instructions       Activity as tolerated              Diet Instructions       Discharge diet (specify)      Discharge Nutrition Therapy: Regular            Other Instructions       Call MD for:  difficulty breathing, headache or visual disturbances      Call MD for:  extreme fatigue      Call MD for:  persistent dizziness or light-headedness      Call MD for:  persistent nausea or vomiting      Call MD for:  severe uncontrolled pain      Call MD for: Temperature > 38.5 Celsius ( > 101.3 Fahrenheit)      Discharge instructions      Ms. Hur, you were admitted to [facility] due to fever, chills, fatigue and found to have infection in your urinary system.  You are now improving with treatment of strong IV antibitoics and we recommend ongoing use of these at home and then follow up with the Infectious Disease team in clinic to determine how long to continue these.  This team will also be able to set up an appointment to have your IV line removed after the infusions are completed.    Continue to follow up with your transplant team for regularly scheduled appointments and labs.  Take your current medications and a list of questions to all follow up visits so your providers can better guide your care.                 Follow Up instructions and Outpatient Referrals     Ambulatory Referral to Home Infusion      Reason for referral: home infusion    Performing location?: External    Home Health Requested Disciplines:  Nursing Comment - PT/eval and treat  Physical Therapy        **  Please contact your service pharmacist for assistance with discharge   home health infusion monitoring.      Call MD for:  difficulty breathing, headache or visual disturbances      Call MD for:  extreme fatigue      Call MD for:  persistent dizziness or light-headedness      Call MD for:  persistent nausea or vomiting      Call MD for:  severe uncontrolled pain      Call MD for: Temperature > 38.5 Celsius ( > 101.3 Fahrenheit)      Discharge instructions          Appointments which have been scheduled for you      May 24, 2023 11:30 AM  (Arrive by 11:15 AM)  RETURN ICID/TRANSPLANT with Audley Hose, MD  [facility] INFECTIOUS DISEASES [place] [facility] REGION) [address]  [facility] 1 through 4  [place] [address]  [phone]        May 24, 2023 1:00 PM  (Arrive by 12:45 PM)  INFUSION ONLY with UNCTIF 22  [facility] THERAPEUTIC INFUSION CTR [place] [facility] REGION) [address]  [name] 1 through 4  [place] [address]  [phone]        Jun 15, 2023 11:30 AM  (Arrive by 11:15 AM)  RETURN NEPHROLOGY POST with Leafy Half, MD  [facility] KIDNEY TRANSPLANT [place] [facility] REGION) [address]  [facility] 1 through 4  [place] [address]  [phone]        Jun 19, 2023 11:30 AM  (Arrive by 11:00 AM)  RETURN VIDEO DIRECT LINK with Otis Dials, FNP  [facility] UROLOGY [address] [place] [facility] REGION)  Arrive at: This is a Video Visit [address]  [place] [address]  [phone]   A direct link will be sent to you by your provider at the time of your video appointment. Please do NOT go to the clinic.     For your video visit, you will need a computer with a working camera, speaker and microphone, a smartphone, or a tablet with internet access.         Jun 20, 2023 9:30 AM  (Arrive by 9:20 AM)  RETURN HCP TELEPHONE with Eliezer Bottom, LCSW  [facility] KIDNEY TRANSPLANT [place] [facility] REGION) [address]  [place] [address]  [phone]        Jun 25, 2023 3:30 PM  (Arrive by 3:15 PM)  INFUSION ONLY with UNCTIF 11  [facility] THERAPEUTIC INFUSION CTR [place] [facility] REGION) [address]  [facility] 1 through 4  [place] [address]  [phone]        Jun 28, 2023 11:30 AM  (Arrive by 11:15 AM)  RETURN ICID/TRANSPLANT with Audley Hose, MD  [facility] INFECTIOUS DISEASES [place] [facility] REGION) [address]  [facility] 1 through 4  [place] [address]  [phone]        Jul 23, 2023 10:45 AM  (Arrive by 10:30 AM)  INFUSION ONLY with UNCTIF 11  [facility] THERAPEUTIC INFUSION CTR [place] [facility] REGION) [address]  [facility] 1 through 4  [place] [address]  [phone]             ______________________________________________________________________  Discharge Day Services:  BP 167/52  -  Pulse 65  - Temp 36.9 ??C (98.4 ??F) (Oral)  - Resp 18  - Ht 152.4 cm (5')  - Wt 58.8 kg (129 lb 10.1 oz)  - SpO2 98%  - BMI 25.32 kg/m??     Pt seen on the day of discharge and determined appropriate for discharge.    Condition at Discharge: stable    Length of Discharge: I spent greater than 30 mins in the discharge of this patient.

## 2023-05-23 NOTE — Unmapped (Signed)
Discharge instructions given to patient and spouse by bedside. No comments or concerns at this time. PIV out.   Problem: Adult Inpatient Plan of Care  Goal: Plan of Care Review  Outcome: Resolved  Flowsheets (Taken 05/23/2023 1319)  Plan of Care Reviewed With:   patient   spouse  Goal: Patient-Specific Goal (Individualized)  Outcome: Resolved  Goal: Absence of Hospital-Acquired Illness or Injury  Outcome: Resolved  Intervention: Identify and Manage Fall Risk  Recent Flowsheet Documentation  Taken 05/23/2023 0730 by Ritta Slot, RN  Safety Interventions:   environmental modification   fall reduction program maintained   lighting adjusted for tasks/safety   low bed  Intervention: Prevent Skin Injury  Recent Flowsheet Documentation  Taken 05/23/2023 0730 by Ritta Slot, RN  Positioning for Skin: Supine/Back  Intervention: Prevent and Manage VTE (Venous Thromboembolism) Risk  Recent Flowsheet Documentation  Taken 05/23/2023 1200 by Ritta Slot, RN  Anti-Embolism Intervention: (heparin) Other (Comment)  Taken 05/23/2023 1000 by Ritta Slot, RN  Anti-Embolism Intervention: (heparin) Other (Comment)  Taken 05/23/2023 0800 by Ritta Slot, RN  Anti-Embolism Intervention: (heparin) Other (Comment)  Taken 05/23/2023 0730 by Ritta Slot, RN  Anti-Embolism Intervention: (heparin) Other (Comment)  Intervention: Prevent Infection  Recent Flowsheet Documentation  Taken 05/23/2023 0730 by Ritta Slot, RN  Infection Prevention:   cohorting utilized   environmental surveillance performed   hand hygiene promoted   rest/sleep promoted  Goal: Optimal Comfort and Wellbeing  Outcome: Resolved  Goal: Readiness for Transition of Care  Outcome: Resolved  Goal: Rounds/Family Conference  Outcome: Resolved     Problem: Fall Injury Risk  Goal: Absence of Fall and Fall-Related Injury  Outcome: Resolved  Intervention: Promote Injury-Free Environment  Recent Flowsheet Documentation  Taken 05/23/2023 0730 by Ritta Slot, RN  Safety Interventions: environmental modification   fall reduction program maintained   lighting adjusted for tasks/safety   low bed     Problem: Infection  Goal: Absence of Infection Signs and Symptoms  Outcome: Resolved  Intervention: Prevent or Manage Infection  Recent Flowsheet Documentation  Taken 05/23/2023 0730 by Ritta Slot, RN  Infection Management: aseptic technique maintained  Isolation Precautions: contact precautions maintained     Problem: Comorbidity Management  Goal: Blood Glucose Levels Within Targeted Range  Outcome: Resolved  Goal: Blood Pressure in Desired Range  Outcome: Resolved     Problem: Infection  Goal: Absence of Infection Signs and Symptoms  Outcome: Resolved  Intervention: Prevent or Manage Infection  Recent Flowsheet Documentation  Taken 05/23/2023 0730 by Ritta Slot, RN  Infection Management: aseptic technique maintained  Isolation Precautions: contact precautions maintained

## 2023-05-23 NOTE — Unmapped (Signed)
Pt admitted for urinary tract infection. A/Ox4, RA, VSS. Ambulatory self care x1 minimal assist. No BM this shift. ACHS. No VTE d/t low risk. AV fistula in left upper arm, pink armband on. CVC single lumen left I.J. in place, clean, dry, intact, flushed per protocol. No complaints of pain this shift. Low bed, call bell within reach, fall precautions maintained. Contact isolation maintained. Family at bedside.     Problem: Adult Inpatient Plan of Care  Goal: Plan of Care Review  Outcome: Progressing  Flowsheets (Taken 05/22/2023 2139)  Progress: improving  Plan of Care Reviewed With:   patient   spouse  Goal: Patient-Specific Goal (Individualized)  Outcome: Progressing  Flowsheets (Taken 05/22/2023 2139)  Patient/Family-Specific Goals (Include Timeframe): pt will remain hemodynamically stable and free from falls and injuries during this shift 7a-7p  Individualized Care Needs: monitor VS/labs, ACHS, pain management, IV antibiotics regiment, CVC placement, fall/safety precautions  Anxieties, Fears or Concerns: none voiced  Goal: Absence of Hospital-Acquired Illness or Injury  Outcome: Progressing  Intervention: Identify and Manage Fall Risk  Recent Flowsheet Documentation  Taken 05/22/2023 1830 by Karle Plumber, RN  Safety Interventions:   low bed   lighting adjusted for tasks/safety   isolation precautions   infection management   family at bedside   fall reduction program maintained   environmental modification   nonskid shoes/slippers when out of bed  Taken 05/22/2023 1800 by Karle Plumber, RN  Safety Interventions:   low bed   lighting adjusted for tasks/safety   infection management   isolation precautions   family at bedside   fall reduction program maintained   environmental modification   nonskid shoes/slippers when out of bed  Taken 05/22/2023 1600 by Karle Plumber, RN  Safety Interventions:   low bed   lighting adjusted for tasks/safety   infection management   isolation precautions family at bedside   fall reduction program maintained   environmental modification   nonskid shoes/slippers when out of bed  Taken 05/22/2023 1400 by Karle Plumber, RN  Safety Interventions:   isolation precautions   infection management   family at bedside   fall reduction program maintained   environmental modification   low bed   lighting adjusted for tasks/safety   nonskid shoes/slippers when out of bed  Taken 05/22/2023 1200 by Karle Plumber, RN  Safety Interventions:   low bed   lighting adjusted for tasks/safety   isolation precautions   infection management   family at bedside   fall reduction program maintained   environmental modification   nonskid shoes/slippers when out of bed  Taken 05/22/2023 0800 by Karle Plumber, RN  Safety Interventions:   low bed   lighting adjusted for tasks/safety   family at bedside   isolation precautions   infection management   environmental modification   fall reduction program maintained   nonskid shoes/slippers when out of bed  Intervention: Prevent and Manage VTE (Venous Thromboembolism) Risk  Recent Flowsheet Documentation  Taken 05/22/2023 1830 by Karle Plumber, RN  Anti-Embolism Intervention: (heparin) Other (Comment)  Taken 05/22/2023 1800 by Karle Plumber, RN  Anti-Embolism Intervention: (heparin) Other (Comment)  Taken 05/22/2023 1600 by Karle Plumber, RN  Anti-Embolism Intervention: (heparin) Other (Comment)  Taken 05/22/2023 1400 by Karle Plumber, RN  Anti-Embolism Intervention: (heparin) Other (Comment)  Taken 05/22/2023 1200 by Karle Plumber, RN  Anti-Embolism Intervention: (heparin) Other (Comment)  Taken 05/22/2023 1000 by Karle Plumber, RN  Anti-Embolism Intervention: (heparin) Other (Comment)  Taken 05/22/2023 0800 by Karle Plumber, RN  Anti-Embolism Intervention: (heparin) Other (Comment)  Intervention: Prevent Infection  Recent Flowsheet Documentation  Taken 05/22/2023 0800 by Karle Plumber, RN  Infection Prevention:   personal protective equipment utilized   cohorting utilized   environmental surveillance performed   equipment surfaces disinfected  Goal: Optimal Comfort and Wellbeing  Outcome: Progressing  Goal: Readiness for Transition of Care  Outcome: Progressing  Goal: Rounds/Family Conference  Outcome: Progressing  Flowsheets (Taken 05/22/2023 2139)  Participants: patient     Problem: Fall Injury Risk  Goal: Absence of Fall and Fall-Related Injury  Outcome: Progressing  Intervention: Promote Scientist, clinical (histocompatibility and immunogenetics) Documentation  Taken 05/22/2023 1830 by Karle Plumber, RN  Safety Interventions:   low bed   lighting adjusted for tasks/safety   isolation precautions   infection management   family at bedside   fall reduction program maintained   environmental modification   nonskid shoes/slippers when out of bed  Taken 05/22/2023 1800 by Karle Plumber, RN  Safety Interventions:   low bed   lighting adjusted for tasks/safety   infection management   isolation precautions   family at bedside   fall reduction program maintained   environmental modification   nonskid shoes/slippers when out of bed  Taken 05/22/2023 1600 by Karle Plumber, RN  Safety Interventions:   low bed   lighting adjusted for tasks/safety   infection management   isolation precautions   family at bedside   fall reduction program maintained   environmental modification   nonskid shoes/slippers when out of bed  Taken 05/22/2023 1400 by Karle Plumber, RN  Safety Interventions:   isolation precautions   infection management   family at bedside   fall reduction program maintained   environmental modification   low bed   lighting adjusted for tasks/safety   nonskid shoes/slippers when out of bed  Taken 05/22/2023 1200 by Karle Plumber, RN  Safety Interventions:   low bed   lighting adjusted for tasks/safety   isolation precautions   infection management family at bedside   fall reduction program maintained   environmental modification   nonskid shoes/slippers when out of bed  Taken 05/22/2023 0800 by Karle Plumber, RN  Safety Interventions:   low bed   lighting adjusted for tasks/safety   family at bedside   isolation precautions   infection management   environmental modification   fall reduction program maintained   nonskid shoes/slippers when out of bed     Problem: Infection  Goal: Absence of Infection Signs and Symptoms  Outcome: Progressing  Intervention: Prevent or Manage Infection  Recent Flowsheet Documentation  Taken 05/22/2023 0800 by Karle Plumber, RN  Infection Management: aseptic technique maintained  Isolation Precautions: contact precautions maintained     Problem: Infection  Goal: Absence of Infection Signs and Symptoms  Outcome: Progressing  Intervention: Prevent or Manage Infection  Recent Flowsheet Documentation  Taken 05/22/2023 0800 by Karle Plumber, RN  Infection Management: aseptic technique maintained  Isolation Precautions: contact precautions maintained     Problem: Comorbidity Management  Goal: Blood Glucose Levels Within Targeted Range  Outcome: Progressing  Goal: Blood Pressure in Desired Range  Outcome: Progressing

## 2023-05-24 ENCOUNTER — Ambulatory Visit: Admit: 2023-05-24 | Discharge: 2023-05-24 | Payer: MEDICARE

## 2023-05-24 DIAGNOSIS — A419 Sepsis, unspecified organism: Secondary | ICD-10-CM | POA: Diagnosis not present

## 2023-05-24 DIAGNOSIS — E1121 Type 2 diabetes mellitus with diabetic nephropathy: Secondary | ICD-10-CM | POA: Diagnosis not present

## 2023-05-24 DIAGNOSIS — E8721 Acute metabolic acidosis: Secondary | ICD-10-CM | POA: Diagnosis not present

## 2023-05-24 DIAGNOSIS — D638 Anemia in other chronic diseases classified elsewhere: Secondary | ICD-10-CM | POA: Diagnosis not present

## 2023-05-24 DIAGNOSIS — Z4822 Encounter for aftercare following kidney transplant: Secondary | ICD-10-CM | POA: Diagnosis not present

## 2023-05-24 DIAGNOSIS — N390 Urinary tract infection, site not specified: Secondary | ICD-10-CM | POA: Diagnosis not present

## 2023-05-24 DIAGNOSIS — Z940 Kidney transplant status: Secondary | ICD-10-CM | POA: Diagnosis not present

## 2023-05-24 DIAGNOSIS — A041 Enterotoxigenic Escherichia coli infection: Secondary | ICD-10-CM | POA: Diagnosis not present

## 2023-05-24 LAB — RENAL FUNCTION PANEL
ALBUMIN: 3.1 g/dL — ABNORMAL LOW (ref 3.4–5.0)
ANION GAP: 8 mmol/L (ref 5–14)
BLOOD UREA NITROGEN: 32 mg/dL — ABNORMAL HIGH (ref 9–23)
BUN / CREAT RATIO: 25
CALCIUM: 9.4 mg/dL (ref 8.7–10.4)
CHLORIDE: 104 mmol/L (ref 98–107)
CO2: 27.3 mmol/L (ref 20.0–31.0)
CREATININE: 1.29 mg/dL — ABNORMAL HIGH
EGFR CKD-EPI (2021) FEMALE: 44 mL/min/{1.73_m2} — ABNORMAL LOW (ref >=60)
GLUCOSE RANDOM: 182 mg/dL — ABNORMAL HIGH (ref 70–179)
PHOSPHORUS: 3.3 mg/dL (ref 2.4–5.1)
POTASSIUM: 4.5 mmol/L (ref 3.4–4.8)
SODIUM: 139 mmol/L (ref 135–145)

## 2023-05-24 LAB — CBC W/ AUTO DIFF
BASOPHILS ABSOLUTE COUNT: 0 10*9/L (ref 0.0–0.1)
BASOPHILS RELATIVE PERCENT: 0.4 %
EOSINOPHILS ABSOLUTE COUNT: 0 10*9/L (ref 0.0–0.5)
EOSINOPHILS RELATIVE PERCENT: 0.7 %
HEMATOCRIT: 28.2 % — ABNORMAL LOW (ref 34.0–44.0)
HEMOGLOBIN: 9.8 g/dL — ABNORMAL LOW (ref 11.3–14.9)
LYMPHOCYTES ABSOLUTE COUNT: 0.6 10*9/L — ABNORMAL LOW (ref 1.1–3.6)
LYMPHOCYTES RELATIVE PERCENT: 10.1 %
MEAN CORPUSCULAR HEMOGLOBIN CONC: 34.8 g/dL (ref 32.0–36.0)
MEAN CORPUSCULAR HEMOGLOBIN: 32.6 pg — ABNORMAL HIGH (ref 25.9–32.4)
MEAN CORPUSCULAR VOLUME: 93.8 fL (ref 77.6–95.7)
MEAN PLATELET VOLUME: 7.2 fL (ref 6.8–10.7)
MONOCYTES ABSOLUTE COUNT: 0.4 10*9/L (ref 0.3–0.8)
MONOCYTES RELATIVE PERCENT: 6.4 %
NEUTROPHILS ABSOLUTE COUNT: 5 10*9/L (ref 1.8–7.8)
NEUTROPHILS RELATIVE PERCENT: 82.4 %
PLATELET COUNT: 211 10*9/L (ref 150–450)
RED BLOOD CELL COUNT: 3.01 10*12/L — ABNORMAL LOW (ref 3.95–5.13)
RED CELL DISTRIBUTION WIDTH: 13.9 % (ref 12.2–15.2)
WBC ADJUSTED: 6 10*9/L (ref 3.6–11.2)

## 2023-05-24 MED ADMIN — belatacept (NULOJIX) 300 mg in sodium chloride (NS) 0.9 % 100 mL IVPB: 5 mg/kg | INTRAVENOUS | @ 17:00:00 | Stop: 2023-05-24

## 2023-05-24 MED ADMIN — heparin, porcine (PF) 100 unit/mL injection 200 Units: 200 [IU] | INTRAVENOUS | @ 17:00:00 | Stop: 2023-05-24

## 2023-05-24 NOTE — Unmapped (Signed)
IMMUNOCOMPROMISED HOST INFECTIOUS DISEASE PROGRESS NOTE    Assessment/Plan:     Julie [name] is a 72 y.o. 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 Had perinephric drain in place postoperatively, removed 08/30/22  - Immunosuppression: Belatacept 5 mg/kg every 2 weeks (last dose 10/17), MMF 250 mg BID, & prednisone 5 mg daily   - Baseline creatinine 1-1.3 Estimated Creatinine Clearance: 29.5 mL/min (A) (based on SCr of 1.36 mg/dL (H)).      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 Coli - s/p Levo x 3 weeks (thru stent removal, 3/15) (S: Cefazolin, Cipro/Levo, Pip-tazo, Cephalexin; R: TMP-SMX, Amp, Amp-sulb)  - 10/05/22 BCx/UCx ESBL/FQ-R E. Coli (S: Erta, Gent, Mero, Tobra, Cefepime SDD; I: ceftaz; R: Cipro/Levo, Cefazolin, Ceftriaxone, Amp) s/p 2w Meropenem; 10/10/22 IR guided drainage negative cx, Cr 1.5 not suggestive of urinoma   - 11/04/22 Bcx/Ucx E. Coli (R CFZ, CTX, cipro/levo, I ceftaz, SDD CFP, susc nitro) s/p 2w Meropenem  - 11/28/22 seen by urogyn, no structural issues, added vaginal estrogen/d-mannose  - 12/25/22 UCx <10,000 E. Coli (S: Cefepime SDD; Erta/Mero, Gent, Nitrofurantoin, Tobra) s/p ertapenem until 01/15/23  - nitrofurantoin prophylaxis (4/29-6/2) not restarted due to reduced GFR and breakthrough. Fosfomycin cost-prohibitive  - 01/27/23 - UCx <1,000 CFU E. Coli, BCx (NG), CTAP: 7.4 x 1.0 x 5.4 cm crescentic fluid collection within the soft tissues lateral to the transplant kidney (VIR unable to aspirate) treated with ertapenem until CT resolution of collection on 04/05/23 imaging  - 05/19/23 CT A/P: LUQ renal allograft without evidence of collecting system dilation or perinephric fluid collection.      Active infection:  # E.Coli & E. Faecalis UTI 05/17/23  - completed ertapenem mid-Sept after resolution of fluid collection on 04/05/23 imaging  - 10/21 Flu/COVID vaccine  - Febrile 10/22 PM with chills vomiting, myalgias, altered mentation. No urgency or dysuria prodrome.  - 10/24 UA: >182 WBC, 6 RBC, bacteria. Ucx: 10-50K E. Coli (S: Cefepime (SDD), Erta/Mero, Gent, Nitrofurantoin, tobra; I: Ceftaz, Levo; R: Amp, Aztreonam, Cefazolin, Ceftriaxone, Cefphalexin, Cipro, Tetracycline, TMP-SMX) & 10-50k E. Faecalis (S: Amp, Nitrofurantoin, Vanc; R: Doxy)   - 10/24 BCx (NG)  - 10/26: CT A/P without  fluid collections  - 10/27 UCx: mixed urogenital flora; UA: large leuks, few bacteria     Rx 10/24 Vanc/Cefepime -> 10/25 Ertapenem->10/27 meropenem--> 10/29 Ertapenem      Antimicrobial allergy/intolerance:   Penicillin (see allergy list for nuances)  Valacyclovir - nausea      RECOMMENDATIONS      Management  Continue ertapenem renally dosed equiv of 1g daily , will likely extend through the holidays given her propensity to present with infections when off IV abx and lack of great oral suppressive options. It is interesting that her Cr has improved with treatment, could there be a component of chronic pyelo? Longer treatment likely needed since we didn't get bx  After treatment hopefully we can get her on ppx with nitrofurantoin if crcl will allow vs methenamine. Fosfomycin has been prohibitively expensive for her    Antimicrobial prophylaxis required for transplant immunosuppression and age and comorbidities   No abx ppx right now  She said she got her flu and COVID vaccines this year but I dont see that documented  I'd recommend RSV vaccine, I didn't mention that today but she could get at [facility] pharmacy when she's here next    Intensive toxicity monitoring for prescription antimicrobials   CBC w/diff at least once per week  CMP at least once per week  clinical assessments for rashes or other skin changes    Follow up in 1 month           Recommendations were communicated via shared medical record.    Audley Hose, MD  [facility]    Subjective     External record(s):  reviewed discharge summary. Admitted for UTI, CT A/P normal. Considered renal bx during admission, in conversations with nephro risks thought to outweigh benefits .    Independent historian(s): no independent historian required.       Interval History:   Doing well   Just got out of the hospital  No diarrhea  No abdominal pain   She said she already got flu and covid vaccines these year     Medications:    Current Outpatient Medications:     alendronate (FOSAMAX) 70 MG tablet, Take 1 tablet (70 mg total) by mouth every seven (7) days., Disp: , Rfl:     amlodipine (NORVASC) 5 MG tablet, Take 1 tablet (5 mg total) by mouth every twelve (12) hours., Disp: 90 tablet, Rfl: 3    atorvastatin (LIPITOR) 10 MG tablet, Take 1 tablet (10 mg total) by mouth daily., Disp: 90 tablet, Rfl: 3    blood-glucose sensor (DEXCOM G7 SENSOR) Devi, Use 1 sensor every 10 days., Disp: 3 each, Rfl: 11    carvedilol (COREG) 25 MG tablet, Take 1 tablet (25 mg total) by mouth two (2) times a day., Disp: 180 tablet, Rfl: 3    chlorthalidone (HYGROTON) 25 MG tablet, Take 2 tablets (50 mg total) by mouth daily., Disp: 180 tablet, Rfl: 3    cholecalciferol, vitamin D3 25 mcg, 1,000 units,, (CHOLECALCIFEROL-25 MCG, 1,000 UNIT,) 1,000 unit (25 mcg) tablet, Take 2 tablets (50 mcg total) by mouth daily., Disp: , Rfl:     cranberry fruit extract (CRANBERRY CONCENTRATE ORAL), Take 1 capsule by mouth in the morning., Disp: , Rfl:     cyanocobalamin, vitamin B-12, 1000 MCG tablet, Take 1 tablet (1,000 mcg total) by mouth daily., Disp: , Rfl:     D-MANNOSE ORAL, Take 2,000 mg by mouth in the morning., Disp: ,  Rfl:     ertapenem (INVANZ) 1 gram injection, , Disp: , Rfl:     ertapenem 500 mg, OVERFILL 7 mL in sodium chloride 0.9 % 50 mL IVPB, Infuse 500 mg into a venous catheter daily. Tentatively 30 days but final duration to be determined in ID clinic, Disp: 30 each, Rfl: 0    estradiol (ESTRACE) 0.01 % (0.1 mg/gram) vaginal cream, Insert 2 g into the vagina nightly., Disp: 60 g, Rfl: 11    hydrALAZINE (APRESOLINE) 100 MG tablet, Take 1 tablet (100 mg total) by mouth Three (3) times a day., Disp: 270 tablet, Rfl: 3    insulin aspart (NOVOLOG) 100 unit/mL vial, Inject 0.03-0.05 mL (3-5 Units total) under the skin Three (3) times a day before meals., Disp: , Rfl:     insulin glargine (BASAGLAR, LANTUS) 100 unit/mL (3 mL) injection pen, Inject 0.06 mL (6 Units total) under the skin nightly., Disp: 3 mL, Rfl: 0    lancets Misc, Use to check blood sugar as directed with insulin 3 times a day & for symptoms of high or low blood sugar., Disp: 100 each, Rfl: 0    levothyroxine (SYNTHROID) 112 MCG tablet, Take 1 tablet (112 mcg total) by mouth daily., Disp: 90 tablet, Rfl: 3    magnesium oxide (MAG-OX) 400 mg (241.3 mg elemental magnesium) tablet, Take 1 tablet (400 mg total) by mouth two (2) times a day., Disp: 60 tablet, Rfl: 11    melatonin 5 mg tablet, Take 1 tablet (5 mg total) by mouth at bedtime., Disp: , Rfl:     mycophenolate (CELLCEPT) 250 mg capsule, Take 1 capsule (250 mg total) by mouth two (2) times a day., Disp: 180 capsule, Rfl: 3    nitrofurantoin, macrocrystal-monohydrate, (MACROBID) 100 MG capsule, , Disp: , Rfl:     pantoprazole (PROTONIX) 40 MG tablet, Take 1 tablet (40 mg total) by mouth daily., Disp: 30 tablet, Rfl: 0    predniSONE (DELTASONE) 5 MG tablet, Take 1 tablet (5 mg total) by mouth in the morning., Disp: 90 tablet, Rfl: 3    sodium bicarbonate 650 mg tablet, Take 1 tablet (650 mg total) by mouth Three (3) times a day., Disp: 270 tablet, Rfl: 3    spironolactone (ALDACTONE) 25 MG tablet, Take 1 tablet (25 mg total) by mouth daily., Disp: 90 tablet, Rfl: 3    Objective     Vital signs:  BP 185/56 (BP Site: R Arm, BP Position: Sitting, BP Cuff Size: Medium) Comment: Has taken BP medication today - Pulse 68  - Temp 37.1 ??C (98.8 ??F) (Temporal)  - Resp 18  - Ht 152.4 cm (5')  - Wt 60.3 kg (133 lb)  - BMI 25.97 kg/m??     Physical Exam:  Const [x]  vital signs above    []  NAD, non-toxic appearance []  Chronically ill-appearing, non-distressed        Eyes []  Lids normal bilaterally, conjunctiva anicteric and noninjected OU     [] PERRL  [] EOMI        ENMT []  Normal appearance of external nose and ears, no nasal discharge        []  MMM, no lesions on lips or gums []  No thrush, leukoplakia, oral lesions  []  Dentition good []  Edentulous []  Dental caries present  []  Hearing normal  []  TMs with good light reflexes bilaterally   No thrush      Neck []  Neck of normal appearance and trachea midline        []   No thyromegaly, nodules, or tenderness   []  Full neck ROM        Lymph []  No LAD in neck     []  No LAD in supraclavicular area     []  No LAD in axillae   []  No LAD in epitrochlear chains     []  No LAD in inguinal areas        CV []  RRR            []  No peripheral edema     []  Pedal pulses intact   []  No abnormal heart sounds appreciated   []  Extremities WWP   Systolic murmur      Resp [x]  Normal WOB at rest    []  No breathlessness with speaking, no coughing  []  CTA anteriorly    []  CTA posteriorly          GI [x]  Normal inspection, NTND   []  NABS     []  No umbilical hernia on exam       []  No hepatosplenomegaly     []  Inspection of perineal and perianal areas normal        GU []  Normal external genitalia     [] No urinary catheter present in urethra   []  No CVA tenderness    []  No tenderness over renal allograft        MSK []  No clubbing or cyanosis of hands       []  No vertebral point tenderness  []  No focal tenderness or abnormalities on palpation of joints in RUE, LUE, RLE, or LLE        Skin []  No rashes, lesions, or ulcers of visualized skin     []  Skin warm and dry to palpation   R tunneled line C/D/I      Neuro [x]  Face expression symmetric  []  Sensation to light touch grossly intact throughout    []  Moves extremities equally    []  No tremor noted        []  CNs II-XII grossly intact     []  DTRs normal and symmetric throughout []  Gait unremarkable        Psych [x]  Appropriate affect       []  Fluent speech         []  Attentive, good eye contact  []  Oriented to person, place, time          []  Judgment and insight are appropriate           Data for Medical Decision Making     10/24 EKG QTcF 414    I discussed mgm't w/qualified health care professional(s) involved in case: abx dose reviewed with pharmD .    I reviewed CBC results (WBC normal), chemistry results (Cr a bit improved), and micro result(s) (10/27 urine cx mixed flora).    I independently visualized/interpreted CT images (A/P without fluid collection).       Results in Past 30 Days  Result Component Current Result Ref Range Previous Result Ref Range   Absolute Eosinophils 0.0 (05/19/2023) 0.0 - 0.5 10*9/L 0.0 (05/18/2023) 0.0 - 0.5 10*9/L Absolute Lymphocytes 0.4 (L) (05/19/2023) 1.1 - 3.6 10*9/L 0.4 (L) (05/18/2023) 1.1 - 3.6 10*9/L   Absolute Neutrophils 7.3 (05/19/2023) 1.8 - 7.8 10*9/L 12.6 (H) (05/18/2023) 1.8 - 7.8 10*9/L   Alkaline Phosphatase 64 (05/20/2023) 46 - 116 U/L 67 (05/19/2023) 46 - 116 U/L   ALT 11 (05/20/2023) 10 - 49 U/L 12 (05/19/2023) 10 - 49 U/L   AST  11 (05/20/2023) <=34 U/L 11 (05/19/2023) <=34 U/L   BUN 31 (H) (05/23/2023) 9 - 23 mg/dL 29 (H) (16/04/9603) 9 - 23 mg/dL   Calcium 9.3 (54/03/8118) 8.7 - 10.4 mg/dL 9.5 (14/78/2956) 8.7 - 10.4 mg/dL   Creatinine 2.13 (H) (05/23/2023) 0.55 - 1.02 mg/dL 0.86 (H) (57/84/6962) 9.52 - 1.02 mg/dL   HGB 9.7 (L) (84/13/2440) 11.3 - 14.9 g/dL 9.9 (L) (05/20/2535) 64.4 - 14.9 g/dL   Magnesium 1.8 (03/47/4259) 1.6 - 2.6 mg/dL 2.1 (56/38/7564) 1.6 - 2.6 mg/dL   Phosphorus 3.9 (33/29/5188) 2.4 - 5.1 mg/dL 4.0 (41/12/6061) 2.4 - 5.1 mg/dL   Platelet 016 (08/01/3233) 150 - 450 10*9/L 139 (L) (05/22/2023) 150 - 450 10*9/L   Potassium 4.4 (05/23/2023) 3.4 - 4.8 mmol/L 4.2 (05/22/2023) 3.4 - 4.8 mmol/L   Total Bilirubin 0.3 (05/20/2023) 0.3 - 1.2 mg/dL 0.5 (57/32/2025) 0.3 - 1.2 mg/dL   WBC 5.0 (42/70/6237) 3.6 - 11.2 10*9/L 5.6 (05/22/2023) 3.6 - 11.2 10*9/L

## 2023-05-24 NOTE — Unmapped (Signed)
[facility], S/P Kidney Transplant.  Patient was recently hospitalized for UTI and was discharged home on IV antibiotics. Message sent to transplant team, Knox Saliva, PharmD, and Celene Squibb, RN. Received confirmation that it was ok to infuse Belatacept today. Patient presents with SL CVC to left chest. Dressing is clean, dry, intact. Labs drawn from the line with brisk blood return.  Line flushed per policy. Patient tolerated well. Medications and allergies reviewed by Clinical research associate.  No premeds ordered. Call light at patient's side, pt instructed on how to use. Per patient and spouse they have a home health nurse that comes to the house for the line care.      1244 Belatacept 300 mg in 100 ml started per provider order     1315 Belatacept complete.      Patient tolerated infusion with no ill effects, CVC  flushed with normal saline and heparin per therapy plan, Vitals stable. Patient discharged from Infusion Center in stable condition with no acute distress. Future infusion appointments scheduled.

## 2023-05-31 DIAGNOSIS — A041 Enterotoxigenic Escherichia coli infection: Secondary | ICD-10-CM | POA: Diagnosis not present

## 2023-05-31 DIAGNOSIS — A419 Sepsis, unspecified organism: Secondary | ICD-10-CM | POA: Diagnosis not present

## 2023-05-31 DIAGNOSIS — N390 Urinary tract infection, site not specified: Secondary | ICD-10-CM | POA: Diagnosis not present

## 2023-06-02 DIAGNOSIS — N179 Acute kidney failure, unspecified: Secondary | ICD-10-CM | POA: Diagnosis not present

## 2023-06-02 DIAGNOSIS — B9620 Unspecified Escherichia coli [E. coli] as the cause of diseases classified elsewhere: Secondary | ICD-10-CM | POA: Diagnosis not present

## 2023-06-02 DIAGNOSIS — N3000 Acute cystitis without hematuria: Secondary | ICD-10-CM | POA: Diagnosis not present

## 2023-06-02 DIAGNOSIS — I129 Hypertensive chronic kidney disease with stage 1 through stage 4 chronic kidney disease, or unspecified chronic kidney disease: Secondary | ICD-10-CM | POA: Diagnosis not present

## 2023-06-02 DIAGNOSIS — N189 Chronic kidney disease, unspecified: Secondary | ICD-10-CM | POA: Diagnosis not present

## 2023-06-02 DIAGNOSIS — E782 Mixed hyperlipidemia: Secondary | ICD-10-CM | POA: Diagnosis not present

## 2023-06-02 DIAGNOSIS — A419 Sepsis, unspecified organism: Secondary | ICD-10-CM | POA: Diagnosis not present

## 2023-06-02 DIAGNOSIS — E039 Hypothyroidism, unspecified: Secondary | ICD-10-CM | POA: Diagnosis not present

## 2023-06-02 DIAGNOSIS — E1122 Type 2 diabetes mellitus with diabetic chronic kidney disease: Secondary | ICD-10-CM | POA: Diagnosis not present

## 2023-06-04 DIAGNOSIS — I129 Hypertensive chronic kidney disease with stage 1 through stage 4 chronic kidney disease, or unspecified chronic kidney disease: Secondary | ICD-10-CM | POA: Diagnosis not present

## 2023-06-04 DIAGNOSIS — B9620 Unspecified Escherichia coli [E. coli] as the cause of diseases classified elsewhere: Secondary | ICD-10-CM | POA: Diagnosis not present

## 2023-06-04 DIAGNOSIS — N189 Chronic kidney disease, unspecified: Secondary | ICD-10-CM | POA: Diagnosis not present

## 2023-06-04 DIAGNOSIS — A419 Sepsis, unspecified organism: Secondary | ICD-10-CM | POA: Diagnosis not present

## 2023-06-04 DIAGNOSIS — E039 Hypothyroidism, unspecified: Secondary | ICD-10-CM | POA: Diagnosis not present

## 2023-06-04 DIAGNOSIS — N3000 Acute cystitis without hematuria: Secondary | ICD-10-CM | POA: Diagnosis not present

## 2023-06-04 DIAGNOSIS — E782 Mixed hyperlipidemia: Secondary | ICD-10-CM | POA: Diagnosis not present

## 2023-06-04 DIAGNOSIS — E1122 Type 2 diabetes mellitus with diabetic chronic kidney disease: Secondary | ICD-10-CM | POA: Diagnosis not present

## 2023-06-04 DIAGNOSIS — N179 Acute kidney failure, unspecified: Secondary | ICD-10-CM | POA: Diagnosis not present

## 2023-06-04 NOTE — Unmapped (Signed)
Specialty Medication(s): mycophenolate    Julie [name] has been dis-enrolled from the [employer] [facility] specialty pharmacy services due to a pharmacy change. The patient is now filling at [facility] .    Additional information provided to the patient: we were unable to reach patient to verify, however per inbasket message from coordinator C C on 11/11, patient filling all meds at [facility] now, none at [facility]. Disenrolling per protocol, clinic aware.    Thad Ranger, PharmD  [facility] Specialty Pharmacist

## 2023-06-05 DIAGNOSIS — N189 Chronic kidney disease, unspecified: Secondary | ICD-10-CM | POA: Diagnosis not present

## 2023-06-05 DIAGNOSIS — E782 Mixed hyperlipidemia: Secondary | ICD-10-CM | POA: Diagnosis not present

## 2023-06-05 DIAGNOSIS — N3000 Acute cystitis without hematuria: Secondary | ICD-10-CM | POA: Diagnosis not present

## 2023-06-05 DIAGNOSIS — E039 Hypothyroidism, unspecified: Secondary | ICD-10-CM | POA: Diagnosis not present

## 2023-06-05 DIAGNOSIS — A419 Sepsis, unspecified organism: Secondary | ICD-10-CM | POA: Diagnosis not present

## 2023-06-05 DIAGNOSIS — B9620 Unspecified Escherichia coli [E. coli] as the cause of diseases classified elsewhere: Secondary | ICD-10-CM | POA: Diagnosis not present

## 2023-06-05 DIAGNOSIS — I129 Hypertensive chronic kidney disease with stage 1 through stage 4 chronic kidney disease, or unspecified chronic kidney disease: Secondary | ICD-10-CM | POA: Diagnosis not present

## 2023-06-05 DIAGNOSIS — E1122 Type 2 diabetes mellitus with diabetic chronic kidney disease: Secondary | ICD-10-CM | POA: Diagnosis not present

## 2023-06-05 DIAGNOSIS — A041 Enterotoxigenic Escherichia coli infection: Secondary | ICD-10-CM | POA: Diagnosis not present

## 2023-06-05 DIAGNOSIS — N179 Acute kidney failure, unspecified: Secondary | ICD-10-CM | POA: Diagnosis not present

## 2023-06-05 DIAGNOSIS — N390 Urinary tract infection, site not specified: Secondary | ICD-10-CM | POA: Diagnosis not present

## 2023-06-11 DIAGNOSIS — I129 Hypertensive chronic kidney disease with stage 1 through stage 4 chronic kidney disease, or unspecified chronic kidney disease: Secondary | ICD-10-CM | POA: Diagnosis not present

## 2023-06-11 DIAGNOSIS — A419 Sepsis, unspecified organism: Secondary | ICD-10-CM | POA: Diagnosis not present

## 2023-06-11 DIAGNOSIS — N189 Chronic kidney disease, unspecified: Secondary | ICD-10-CM | POA: Diagnosis not present

## 2023-06-11 DIAGNOSIS — Z940 Kidney transplant status: Secondary | ICD-10-CM | POA: Diagnosis not present

## 2023-06-11 DIAGNOSIS — N179 Acute kidney failure, unspecified: Secondary | ICD-10-CM | POA: Diagnosis not present

## 2023-06-11 DIAGNOSIS — E1122 Type 2 diabetes mellitus with diabetic chronic kidney disease: Secondary | ICD-10-CM | POA: Diagnosis not present

## 2023-06-11 DIAGNOSIS — B9620 Unspecified Escherichia coli [E. coli] as the cause of diseases classified elsewhere: Secondary | ICD-10-CM | POA: Diagnosis not present

## 2023-06-11 DIAGNOSIS — E782 Mixed hyperlipidemia: Secondary | ICD-10-CM | POA: Diagnosis not present

## 2023-06-11 DIAGNOSIS — N3000 Acute cystitis without hematuria: Secondary | ICD-10-CM | POA: Diagnosis not present

## 2023-06-11 DIAGNOSIS — E039 Hypothyroidism, unspecified: Secondary | ICD-10-CM | POA: Diagnosis not present

## 2023-06-11 DIAGNOSIS — N390 Urinary tract infection, site not specified: Principal | ICD-10-CM

## 2023-06-12 DIAGNOSIS — N179 Acute kidney failure, unspecified: Secondary | ICD-10-CM | POA: Diagnosis not present

## 2023-06-12 DIAGNOSIS — I129 Hypertensive chronic kidney disease with stage 1 through stage 4 chronic kidney disease, or unspecified chronic kidney disease: Secondary | ICD-10-CM | POA: Diagnosis not present

## 2023-06-12 DIAGNOSIS — E1122 Type 2 diabetes mellitus with diabetic chronic kidney disease: Secondary | ICD-10-CM | POA: Diagnosis not present

## 2023-06-12 DIAGNOSIS — N189 Chronic kidney disease, unspecified: Secondary | ICD-10-CM | POA: Diagnosis not present

## 2023-06-12 DIAGNOSIS — A419 Sepsis, unspecified organism: Secondary | ICD-10-CM | POA: Diagnosis not present

## 2023-06-12 DIAGNOSIS — B9620 Unspecified Escherichia coli [E. coli] as the cause of diseases classified elsewhere: Secondary | ICD-10-CM | POA: Diagnosis not present

## 2023-06-12 DIAGNOSIS — N3000 Acute cystitis without hematuria: Secondary | ICD-10-CM | POA: Diagnosis not present

## 2023-06-13 DIAGNOSIS — A419 Sepsis, unspecified organism: Secondary | ICD-10-CM | POA: Diagnosis not present

## 2023-06-13 DIAGNOSIS — A041 Enterotoxigenic Escherichia coli infection: Secondary | ICD-10-CM | POA: Diagnosis not present

## 2023-06-13 DIAGNOSIS — N390 Urinary tract infection, site not specified: Secondary | ICD-10-CM | POA: Diagnosis not present

## 2023-06-15 ENCOUNTER — Ambulatory Visit: Admit: 2023-06-15 | Discharge: 2023-06-16 | Payer: MEDICARE

## 2023-06-15 DIAGNOSIS — Z79899 Other long term (current) drug therapy: Secondary | ICD-10-CM | POA: Diagnosis not present

## 2023-06-15 DIAGNOSIS — I151 Hypertension secondary to other renal disorders: Secondary | ICD-10-CM | POA: Diagnosis not present

## 2023-06-15 DIAGNOSIS — Z940 Kidney transplant status: Secondary | ICD-10-CM | POA: Diagnosis not present

## 2023-06-15 MED ORDER — SPIRONOLACTONE 50 MG TABLET
ORAL_TABLET | Freq: Every day | ORAL | 3 refills | 90 days | Status: CP
Start: 2023-06-15 — End: 2024-06-14

## 2023-06-15 MED ORDER — HYDRALAZINE 50 MG TABLET
ORAL_TABLET | Freq: Three times a day (TID) | ORAL | 11 refills | 30 days | Status: CP
Start: 2023-06-15 — End: 2024-06-14

## 2023-06-18 DIAGNOSIS — E782 Mixed hyperlipidemia: Secondary | ICD-10-CM | POA: Diagnosis not present

## 2023-06-18 DIAGNOSIS — N3000 Acute cystitis without hematuria: Secondary | ICD-10-CM | POA: Diagnosis not present

## 2023-06-18 DIAGNOSIS — N189 Chronic kidney disease, unspecified: Secondary | ICD-10-CM | POA: Diagnosis not present

## 2023-06-18 DIAGNOSIS — I129 Hypertensive chronic kidney disease with stage 1 through stage 4 chronic kidney disease, or unspecified chronic kidney disease: Secondary | ICD-10-CM | POA: Diagnosis not present

## 2023-06-18 DIAGNOSIS — N179 Acute kidney failure, unspecified: Secondary | ICD-10-CM | POA: Diagnosis not present

## 2023-06-18 DIAGNOSIS — B9620 Unspecified Escherichia coli [E. coli] as the cause of diseases classified elsewhere: Secondary | ICD-10-CM | POA: Diagnosis not present

## 2023-06-18 DIAGNOSIS — A419 Sepsis, unspecified organism: Secondary | ICD-10-CM | POA: Diagnosis not present

## 2023-06-18 DIAGNOSIS — E039 Hypothyroidism, unspecified: Secondary | ICD-10-CM | POA: Diagnosis not present

## 2023-06-18 DIAGNOSIS — Z940 Kidney transplant status: Secondary | ICD-10-CM | POA: Diagnosis not present

## 2023-06-18 DIAGNOSIS — E1122 Type 2 diabetes mellitus with diabetic chronic kidney disease: Secondary | ICD-10-CM | POA: Diagnosis not present

## 2023-06-20 ENCOUNTER — Ambulatory Visit: Admit: 2023-06-20 | Payer: MEDICARE

## 2023-06-20 DIAGNOSIS — A041 Enterotoxigenic Escherichia coli infection: Secondary | ICD-10-CM | POA: Diagnosis not present

## 2023-06-20 DIAGNOSIS — A419 Sepsis, unspecified organism: Secondary | ICD-10-CM | POA: Diagnosis not present

## 2023-06-20 DIAGNOSIS — N390 Urinary tract infection, site not specified: Secondary | ICD-10-CM | POA: Diagnosis not present

## 2023-06-24 DIAGNOSIS — Z940 Kidney transplant status: Principal | ICD-10-CM

## 2023-06-25 ENCOUNTER — Ambulatory Visit: Admit: 2023-06-25 | Discharge: 2023-06-25 | Payer: MEDICARE

## 2023-06-25 DIAGNOSIS — E1121 Type 2 diabetes mellitus with diabetic nephropathy: Secondary | ICD-10-CM | POA: Diagnosis not present

## 2023-06-25 DIAGNOSIS — E1122 Type 2 diabetes mellitus with diabetic chronic kidney disease: Secondary | ICD-10-CM | POA: Diagnosis not present

## 2023-06-25 DIAGNOSIS — E782 Mixed hyperlipidemia: Secondary | ICD-10-CM | POA: Diagnosis not present

## 2023-06-25 DIAGNOSIS — N3000 Acute cystitis without hematuria: Secondary | ICD-10-CM | POA: Diagnosis not present

## 2023-06-25 DIAGNOSIS — E039 Hypothyroidism, unspecified: Secondary | ICD-10-CM | POA: Diagnosis not present

## 2023-06-25 DIAGNOSIS — Z794 Long term (current) use of insulin: Secondary | ICD-10-CM | POA: Diagnosis not present

## 2023-06-25 DIAGNOSIS — E8721 Acute metabolic acidosis: Secondary | ICD-10-CM | POA: Diagnosis not present

## 2023-06-25 DIAGNOSIS — N179 Acute kidney failure, unspecified: Secondary | ICD-10-CM | POA: Diagnosis not present

## 2023-06-25 DIAGNOSIS — B9620 Unspecified Escherichia coli [E. coli] as the cause of diseases classified elsewhere: Secondary | ICD-10-CM | POA: Diagnosis not present

## 2023-06-25 DIAGNOSIS — Z940 Kidney transplant status: Secondary | ICD-10-CM | POA: Diagnosis not present

## 2023-06-25 DIAGNOSIS — N189 Chronic kidney disease, unspecified: Secondary | ICD-10-CM | POA: Diagnosis not present

## 2023-06-25 DIAGNOSIS — D638 Anemia in other chronic diseases classified elsewhere: Secondary | ICD-10-CM | POA: Diagnosis not present

## 2023-06-25 DIAGNOSIS — I129 Hypertensive chronic kidney disease with stage 1 through stage 4 chronic kidney disease, or unspecified chronic kidney disease: Secondary | ICD-10-CM | POA: Diagnosis not present

## 2023-06-25 DIAGNOSIS — A419 Sepsis, unspecified organism: Secondary | ICD-10-CM | POA: Diagnosis not present

## 2023-06-26 DIAGNOSIS — N390 Urinary tract infection, site not specified: Secondary | ICD-10-CM | POA: Diagnosis not present

## 2023-06-26 DIAGNOSIS — A041 Enterotoxigenic Escherichia coli infection: Secondary | ICD-10-CM | POA: Diagnosis not present

## 2023-06-26 DIAGNOSIS — A419 Sepsis, unspecified organism: Secondary | ICD-10-CM | POA: Diagnosis not present

## 2023-06-28 ENCOUNTER — Ambulatory Visit: Admit: 2023-06-28 | Discharge: 2023-06-29 | Payer: MEDICARE

## 2023-06-28 DIAGNOSIS — Z940 Kidney transplant status: Secondary | ICD-10-CM | POA: Diagnosis not present

## 2023-06-28 DIAGNOSIS — N10 Acute tubulo-interstitial nephritis: Secondary | ICD-10-CM | POA: Diagnosis not present

## 2023-06-28 DIAGNOSIS — A041 Enterotoxigenic Escherichia coli infection: Secondary | ICD-10-CM | POA: Diagnosis not present

## 2023-06-28 DIAGNOSIS — R7881 Bacteremia: Secondary | ICD-10-CM | POA: Diagnosis not present

## 2023-06-28 DIAGNOSIS — N390 Urinary tract infection, site not specified: Principal | ICD-10-CM

## 2023-07-02 DIAGNOSIS — D638 Anemia in other chronic diseases classified elsewhere: Secondary | ICD-10-CM | POA: Diagnosis not present

## 2023-07-02 DIAGNOSIS — N189 Chronic kidney disease, unspecified: Secondary | ICD-10-CM | POA: Diagnosis not present

## 2023-07-02 DIAGNOSIS — E782 Mixed hyperlipidemia: Secondary | ICD-10-CM | POA: Diagnosis not present

## 2023-07-02 DIAGNOSIS — A419 Sepsis, unspecified organism: Secondary | ICD-10-CM | POA: Diagnosis not present

## 2023-07-02 DIAGNOSIS — B9620 Unspecified Escherichia coli [E. coli] as the cause of diseases classified elsewhere: Secondary | ICD-10-CM | POA: Diagnosis not present

## 2023-07-02 DIAGNOSIS — N3000 Acute cystitis without hematuria: Secondary | ICD-10-CM | POA: Diagnosis not present

## 2023-07-02 DIAGNOSIS — E039 Hypothyroidism, unspecified: Secondary | ICD-10-CM | POA: Diagnosis not present

## 2023-07-02 DIAGNOSIS — Z794 Long term (current) use of insulin: Secondary | ICD-10-CM | POA: Diagnosis not present

## 2023-07-02 DIAGNOSIS — I10 Essential (primary) hypertension: Secondary | ICD-10-CM | POA: Diagnosis not present

## 2023-07-02 DIAGNOSIS — Z940 Kidney transplant status: Secondary | ICD-10-CM | POA: Diagnosis not present

## 2023-07-02 DIAGNOSIS — E1122 Type 2 diabetes mellitus with diabetic chronic kidney disease: Secondary | ICD-10-CM | POA: Diagnosis not present

## 2023-07-02 DIAGNOSIS — N179 Acute kidney failure, unspecified: Secondary | ICD-10-CM | POA: Diagnosis not present

## 2023-07-02 DIAGNOSIS — E1159 Type 2 diabetes mellitus with other circulatory complications: Secondary | ICD-10-CM | POA: Diagnosis not present

## 2023-07-02 DIAGNOSIS — Z955 Presence of coronary angioplasty implant and graft: Secondary | ICD-10-CM | POA: Diagnosis not present

## 2023-07-02 DIAGNOSIS — I129 Hypertensive chronic kidney disease with stage 1 through stage 4 chronic kidney disease, or unspecified chronic kidney disease: Secondary | ICD-10-CM | POA: Diagnosis not present

## 2023-07-02 DIAGNOSIS — E1121 Type 2 diabetes mellitus with diabetic nephropathy: Secondary | ICD-10-CM | POA: Diagnosis not present

## 2023-07-03 DIAGNOSIS — E1121 Type 2 diabetes mellitus with diabetic nephropathy: Secondary | ICD-10-CM | POA: Diagnosis not present

## 2023-07-03 DIAGNOSIS — Z955 Presence of coronary angioplasty implant and graft: Secondary | ICD-10-CM | POA: Diagnosis not present

## 2023-07-03 DIAGNOSIS — D638 Anemia in other chronic diseases classified elsewhere: Secondary | ICD-10-CM | POA: Diagnosis not present

## 2023-07-03 DIAGNOSIS — I10 Essential (primary) hypertension: Secondary | ICD-10-CM | POA: Diagnosis not present

## 2023-07-03 DIAGNOSIS — E1159 Type 2 diabetes mellitus with other circulatory complications: Secondary | ICD-10-CM | POA: Diagnosis not present

## 2023-07-03 DIAGNOSIS — E039 Hypothyroidism, unspecified: Secondary | ICD-10-CM | POA: Diagnosis not present

## 2023-07-03 DIAGNOSIS — Z794 Long term (current) use of insulin: Secondary | ICD-10-CM | POA: Diagnosis not present

## 2023-07-06 DIAGNOSIS — R7881 Bacteremia: Secondary | ICD-10-CM | POA: Diagnosis not present

## 2023-07-06 DIAGNOSIS — A041 Enterotoxigenic Escherichia coli infection: Secondary | ICD-10-CM | POA: Diagnosis not present

## 2023-07-06 MED ORDER — FLUCONAZOLE 150 MG TABLET
ORAL_TABLET | Freq: Once | ORAL | 0 refills | 1.00 days
Start: 2023-07-06 — End: 2023-07-07

## 2023-07-09 DIAGNOSIS — B9620 Unspecified Escherichia coli [E. coli] as the cause of diseases classified elsewhere: Secondary | ICD-10-CM | POA: Diagnosis not present

## 2023-07-09 DIAGNOSIS — A419 Sepsis, unspecified organism: Secondary | ICD-10-CM | POA: Diagnosis not present

## 2023-07-09 DIAGNOSIS — Z7952 Long term (current) use of systemic steroids: Secondary | ICD-10-CM | POA: Diagnosis not present

## 2023-07-09 DIAGNOSIS — I129 Hypertensive chronic kidney disease with stage 1 through stage 4 chronic kidney disease, or unspecified chronic kidney disease: Secondary | ICD-10-CM | POA: Diagnosis not present

## 2023-07-09 DIAGNOSIS — I2510 Atherosclerotic heart disease of native coronary artery without angina pectoris: Secondary | ICD-10-CM | POA: Diagnosis not present

## 2023-07-09 DIAGNOSIS — E039 Hypothyroidism, unspecified: Secondary | ICD-10-CM | POA: Diagnosis not present

## 2023-07-09 DIAGNOSIS — E782 Mixed hyperlipidemia: Secondary | ICD-10-CM | POA: Diagnosis not present

## 2023-07-09 DIAGNOSIS — N3000 Acute cystitis without hematuria: Secondary | ICD-10-CM | POA: Diagnosis not present

## 2023-07-09 DIAGNOSIS — N179 Acute kidney failure, unspecified: Secondary | ICD-10-CM | POA: Diagnosis not present

## 2023-07-09 DIAGNOSIS — E1122 Type 2 diabetes mellitus with diabetic chronic kidney disease: Secondary | ICD-10-CM | POA: Diagnosis not present

## 2023-07-09 DIAGNOSIS — Z9181 History of falling: Secondary | ICD-10-CM | POA: Diagnosis not present

## 2023-07-09 DIAGNOSIS — N189 Chronic kidney disease, unspecified: Secondary | ICD-10-CM | POA: Diagnosis not present

## 2023-07-13 DIAGNOSIS — A041 Enterotoxigenic Escherichia coli infection: Secondary | ICD-10-CM | POA: Diagnosis not present

## 2023-07-13 DIAGNOSIS — R7881 Bacteremia: Secondary | ICD-10-CM | POA: Diagnosis not present

## 2023-07-14 DIAGNOSIS — R7881 Bacteremia: Secondary | ICD-10-CM | POA: Diagnosis not present

## 2023-07-14 DIAGNOSIS — A041 Enterotoxigenic Escherichia coli infection: Secondary | ICD-10-CM | POA: Diagnosis not present

## 2023-07-16 DIAGNOSIS — I129 Hypertensive chronic kidney disease with stage 1 through stage 4 chronic kidney disease, or unspecified chronic kidney disease: Secondary | ICD-10-CM | POA: Diagnosis not present

## 2023-07-16 DIAGNOSIS — E782 Mixed hyperlipidemia: Secondary | ICD-10-CM | POA: Diagnosis not present

## 2023-07-16 DIAGNOSIS — B9620 Unspecified Escherichia coli [E. coli] as the cause of diseases classified elsewhere: Secondary | ICD-10-CM | POA: Diagnosis not present

## 2023-07-16 DIAGNOSIS — E039 Hypothyroidism, unspecified: Secondary | ICD-10-CM | POA: Diagnosis not present

## 2023-07-16 DIAGNOSIS — A419 Sepsis, unspecified organism: Secondary | ICD-10-CM | POA: Diagnosis not present

## 2023-07-16 DIAGNOSIS — N179 Acute kidney failure, unspecified: Secondary | ICD-10-CM | POA: Diagnosis not present

## 2023-07-16 DIAGNOSIS — N3000 Acute cystitis without hematuria: Secondary | ICD-10-CM | POA: Diagnosis not present

## 2023-07-16 DIAGNOSIS — N189 Chronic kidney disease, unspecified: Secondary | ICD-10-CM | POA: Diagnosis not present

## 2023-07-16 DIAGNOSIS — E1122 Type 2 diabetes mellitus with diabetic chronic kidney disease: Secondary | ICD-10-CM | POA: Diagnosis not present

## 2023-07-16 DIAGNOSIS — Z940 Kidney transplant status: Secondary | ICD-10-CM | POA: Diagnosis not present

## 2023-07-19 DIAGNOSIS — I2510 Atherosclerotic heart disease of native coronary artery without angina pectoris: Secondary | ICD-10-CM | POA: Diagnosis not present

## 2023-07-19 DIAGNOSIS — I110 Hypertensive heart disease with heart failure: Secondary | ICD-10-CM | POA: Diagnosis not present

## 2023-07-19 DIAGNOSIS — E1140 Type 2 diabetes mellitus with diabetic neuropathy, unspecified: Secondary | ICD-10-CM | POA: Diagnosis not present

## 2023-07-19 DIAGNOSIS — E039 Hypothyroidism, unspecified: Secondary | ICD-10-CM | POA: Diagnosis not present

## 2023-07-19 DIAGNOSIS — I5022 Chronic systolic (congestive) heart failure: Secondary | ICD-10-CM | POA: Diagnosis not present

## 2023-07-19 DIAGNOSIS — Z794 Long term (current) use of insulin: Secondary | ICD-10-CM | POA: Diagnosis not present

## 2023-07-19 DIAGNOSIS — N390 Urinary tract infection, site not specified: Secondary | ICD-10-CM | POA: Diagnosis not present

## 2023-07-19 DIAGNOSIS — Z940 Kidney transplant status: Secondary | ICD-10-CM | POA: Diagnosis not present

## 2023-07-21 DIAGNOSIS — R7881 Bacteremia: Secondary | ICD-10-CM | POA: Diagnosis not present

## 2023-07-21 DIAGNOSIS — A041 Enterotoxigenic Escherichia coli infection: Secondary | ICD-10-CM | POA: Diagnosis not present

## 2023-07-23 ENCOUNTER — Encounter: Admit: 2023-07-23 | Discharge: 2023-07-24 | Payer: MEDICARE

## 2023-07-23 DIAGNOSIS — Z940 Kidney transplant status: Secondary | ICD-10-CM | POA: Diagnosis not present

## 2023-07-23 DIAGNOSIS — E1122 Type 2 diabetes mellitus with diabetic chronic kidney disease: Secondary | ICD-10-CM | POA: Diagnosis not present

## 2023-07-23 DIAGNOSIS — E782 Mixed hyperlipidemia: Secondary | ICD-10-CM | POA: Diagnosis not present

## 2023-07-23 DIAGNOSIS — N179 Acute kidney failure, unspecified: Secondary | ICD-10-CM | POA: Diagnosis not present

## 2023-07-23 DIAGNOSIS — N3000 Acute cystitis without hematuria: Secondary | ICD-10-CM | POA: Diagnosis not present

## 2023-07-23 DIAGNOSIS — N189 Chronic kidney disease, unspecified: Secondary | ICD-10-CM | POA: Diagnosis not present

## 2023-07-23 DIAGNOSIS — E039 Hypothyroidism, unspecified: Secondary | ICD-10-CM | POA: Diagnosis not present

## 2023-07-23 DIAGNOSIS — Z09 Encounter for follow-up examination after completed treatment for conditions other than malignant neoplasm: Secondary | ICD-10-CM | POA: Diagnosis not present

## 2023-07-23 DIAGNOSIS — A419 Sepsis, unspecified organism: Secondary | ICD-10-CM | POA: Diagnosis not present

## 2023-07-23 DIAGNOSIS — B9620 Unspecified Escherichia coli [E. coli] as the cause of diseases classified elsewhere: Secondary | ICD-10-CM | POA: Diagnosis not present

## 2023-07-23 DIAGNOSIS — I129 Hypertensive chronic kidney disease with stage 1 through stage 4 chronic kidney disease, or unspecified chronic kidney disease: Secondary | ICD-10-CM | POA: Diagnosis not present

## 2023-08-02 ENCOUNTER — Ambulatory Visit: Admit: 2023-08-02 | Discharge: 2023-08-03 | Payer: MEDICARE

## 2023-08-02 DIAGNOSIS — T8619 Other complication of kidney transplant: Principal | ICD-10-CM

## 2023-08-02 DIAGNOSIS — N151 Renal and perinephric abscess: Principal | ICD-10-CM

## 2023-08-02 DIAGNOSIS — Z940 Kidney transplant status: Principal | ICD-10-CM

## 2023-08-02 DIAGNOSIS — N390 Urinary tract infection, site not specified: Principal | ICD-10-CM

## 2023-08-02 MED ORDER — METHENAMINE HIPPURATE 1 GRAM TABLET
ORAL_TABLET | Freq: Two times a day (BID) | ORAL | 11 refills | 30.00 days | Status: CP
Start: 2023-08-02 — End: 2024-08-01

## 2023-08-06 MED ORDER — FOSFOMYCIN TROMETHAMINE 3 GRAM ORAL PACKET
PACK | 5 refills | 0.00 days | Status: CP
Start: 2023-08-06 — End: 2024-02-02

## 2023-08-10 ENCOUNTER — Ambulatory Visit: Admit: 2023-08-10 | Discharge: 2023-08-11 | Payer: MEDICARE

## 2023-08-10 DIAGNOSIS — N151 Renal and perinephric abscess: Principal | ICD-10-CM

## 2023-08-10 DIAGNOSIS — T8619 Other complication of kidney transplant: Principal | ICD-10-CM

## 2023-08-20 ENCOUNTER — Encounter: Admit: 2023-08-20 | Discharge: 2023-08-21 | Payer: MEDICARE

## 2023-08-27 ENCOUNTER — Ambulatory Visit: Admit: 2023-08-27 | Discharge: 2023-08-28 | Payer: MEDICARE

## 2023-08-30 ENCOUNTER — Ambulatory Visit: Admit: 2023-08-30 | Discharge: 2023-08-31 | Payer: MEDICARE

## 2023-08-30 DIAGNOSIS — N200 Calculus of kidney: Principal | ICD-10-CM

## 2023-08-30 DIAGNOSIS — Z940 Kidney transplant status: Principal | ICD-10-CM

## 2023-08-30 MED ORDER — TAMSULOSIN 0.4 MG CAPSULE
ORAL_CAPSULE | Freq: Every evening | ORAL | 0 refills | 30.00 days | Status: CP
Start: 2023-08-30 — End: 2023-09-29

## 2023-08-31 DIAGNOSIS — Z940 Kidney transplant status: Principal | ICD-10-CM

## 2023-09-04 ENCOUNTER — Emergency Department: Payer: Medicare Other

## 2023-09-04 ENCOUNTER — Encounter: Payer: Self-pay | Admitting: Emergency Medicine

## 2023-09-04 ENCOUNTER — Emergency Department
Admission: EM | Admit: 2023-09-04 | Discharge: 2023-09-04 | Disposition: A | Discharge status: Home / Self Care | Payer: Medicare Other | Attending: Emergency Medicine | Admitting: Emergency Medicine

## 2023-09-04 ENCOUNTER — Other Ambulatory Visit: Payer: Self-pay

## 2023-09-04 DIAGNOSIS — N186 End stage renal disease: Secondary | ICD-10-CM | POA: Diagnosis not present

## 2023-09-04 DIAGNOSIS — Z992 Dependence on renal dialysis: Secondary | ICD-10-CM | POA: Insufficient documentation

## 2023-09-04 DIAGNOSIS — M545 Low back pain, unspecified: Secondary | ICD-10-CM | POA: Diagnosis present

## 2023-09-04 DIAGNOSIS — M5442 Lumbago with sciatica, left side: Secondary | ICD-10-CM | POA: Insufficient documentation

## 2023-09-04 DIAGNOSIS — E1122 Type 2 diabetes mellitus with diabetic chronic kidney disease: Secondary | ICD-10-CM | POA: Diagnosis not present

## 2023-09-04 MED ORDER — HYDROCODONE-ACETAMINOPHEN 5-325 MG PO TABS: 1.0000 | ORAL_TABLET | Freq: Four times a day (QID) | ORAL | 0 refills | Status: AC | PRN

## 2023-09-04 MED ORDER — HYDROCODONE-ACETAMINOPHEN 5-325 MG PO TABS
2.0000 | ORAL_TABLET | Freq: Once | ORAL | Status: AC
  Administered 2023-09-04: 2 via ORAL
  Filled 2023-09-04: qty 2

## 2023-09-04 NOTE — ED Triage Notes (Signed)
Patient to ED via POV for left sided lower back pain. Pt reports it radiates down left leg. Ongoing x4-5 days.

## 2023-09-04 NOTE — ED Provider Notes (Signed)
-----------------------------------------   5:05 PM on 09/04/2023 -----------------------------------------  I took over care of this patient from Dr. Fanny Bien.  MRI shows multiple lumbosacral findings:  IMPRESSION:  1. Lumbar spondylosis as outlined within the body of the report and  most notably as follows.  2. At L3-L4, moderate disc degeneration with mild degenerative  endplate edema. 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 Right subarticular stenosis and moderate left neural  foraminal narrowing also present at this level.  3. At L4-L5, multifactorial right greater than left subarticular  stenosis (with potential to affect either descending L5 nerve root.  Moderate central canal stenosis. Bilateral neural foraminal  narrowing (mild/moderate right, moderate left).    I consulted and discussed the case with Dr. Katrinka Blazing from neurosurgery who reviewed the imaging.  The patient has no weakness or numbness.  Given the lack of any neurologic deficits, Dr. Katrinka Blazing recommends close outpatient follow-up.  He can see the patient in the office tomorrow.  There is no indication for acute intervention in the ED here.  I counseled the patient on the results of the workup and plan of care, and they are in agreement.  I gave strict return precautions and she expressed understanding.  She is stable for discharge at this time.   Dionne Bucy, MD 09/04/23 [id number]

## 2023-09-04 NOTE — ED Provider Notes (Signed)
[facility] Provider Note    Event Date/Time   First MD Initiated Contact with Patient 09/04/23 1349     (approximate)   History   Back Pain   HPI  Bethany Lee is a 73 y.o. female with a extensive history including coronary disease GI bleeding chronic kidney disease type 2 diabetes end-stage renal disease on hemodialysis  She reports that for about 2 weeks now has been experiencing pain in her lower back.  Pain is located in her left lower back points towards her left lower lumbar but radiates out across her buttock it is sharp and radiates down behind the back of the leg to about the level of the knee.  There is been no weakness numbness or cold or blue foot.  She has not had any abdominal pain.  She has a transplanted kidney.  She has been eating drinking otherwise normal with exception to the pain.  It is worsened by sleeping on her back seems to alleviated somewhat by laying on her side.  She was told that urgent care that it may be from sciatica.  She was treated with steroids and hydrocodone but revises is not relieved her pain     Physical Exam   Triage Vital Signs: ED Triage Vitals  Encounter Vitals Group     BP 09/04/23 1222 (!) 148/56     Systolic BP Percentile --      Diastolic BP Percentile --      Pulse Rate 09/04/23 1222 70     Resp 09/04/23 1222 18     Temp 09/04/23 1222 98.8 F (37.1 C)     Temp Source 09/04/23 1222 Oral     SpO2 09/04/23 1222 93 %     Weight 09/04/23 1223 130 lb (59 kg)     Height 09/04/23 1223 5' (1.524 m)     Head Circumference --      Peak Flow --      Pain Score 09/04/23 1223 10     Pain Loc --      Pain Education --      Exclude from Growth Chart --     Most recent vital signs: Vitals:   09/04/23 1222  BP: (!) 148/56  Pulse: 70  Resp: 18  Temp: 98.8 F (37.1 C)  SpO2: 93%     General: Awake, no distress.  She is resting comfortably with husband at bedside CV:  Good peripheral perfusion.   Palpable as well as dopplerable dorsalis pedis and posterior tibial in the left foot.  Also the right foot with good pulses in place.  Warm well-perfused toes in all extremities. Resp:  Normal effort.  Abd:  No distention.  Abdomen is soft nontender nondistended. Other:  No costovertebral angle tenderness to percussion bilaterally.  No midline thoracic or upper lumbar tenderness.  No midline lower lumbar tenderness but she reports tenderness and points towards the left lateral paraspinous region of the low lumbar spine.  There is no palpable mass or overlying lesion or rash.  The pain there also radiates out and she shows a tract that runs across the back of her buttock down towards her left leg.  Both legs appear normal without edema.  Demonstrates normal hip and knee flexion as well as good use of the left lower extremity foot and ankle as well.  No neurologic deficits and normal sensation across the feet bilaterally  ED Results / Procedures / Treatments   Labs (all  labs ordered are listed, but only abnormal results are displayed) Labs Reviewed - No data to display   EKG     RADIOLOGY  MRI lumbar spine pending at signout   PROCEDURES:  Critical Care performed: No  Procedures   MEDICATIONS ORDERED IN ED: Medications  HYDROcodone-acetaminophen (NORCO/VICODIN) 5-325 MG per tablet 2 tablet (2 tablets Oral Given 09/04/23 1410)     IMPRESSION / MDM / ASSESSMENT AND PLAN / ED COURSE  I reviewed the triage vital signs and the nursing notes.                              Documentation from urgent care reviewed from February 7, diagnosed with sacroiliitis at that time//left-sided sciatica  Differential diagnosis includes, but is not limited to, based on her clinical exam most likely sciatica but she has a complex medical history and consideration for other causes such as herniated nucleus pulposus, discitis, abscess, cauda equina, compression fracture etc. are given.  Reassuring  exam afebrile status without signs or symptoms to suggest acute infectious cause.  Based on the ongoing longevity of symptoms, will obtain MRI without contrast.  No associated gastrointestinal or vascular symptoms.  Reassuring neurovascular exam of the left leg.  Patient's presentation is most consistent with acute complicated illness / injury requiring diagnostic workup.     Ongoing care and disposition signed oncoming partner Dr. Marisa Severin at 315p.  Felt follow-up and evaluate for potential emergent causes of back pain on MRI which is pending.     FINAL CLINICAL IMPRESSION(S) / ED DIAGNOSES   Final diagnoses:  Acute left-sided low back pain with left-sided sciatica     Rx / DC Orders   ED Discharge Orders     None        Note:  This document was prepared using Dragon voice recognition software and may include unintentional dictation errors.   Sharyn Creamer, MD 09/04/23 1510

## 2023-09-04 NOTE — ED Notes (Signed)
Pt back from MR.

## 2023-09-04 NOTE — Progress Notes (Unsigned)
Referring Physician:  Barbette Reichmann, MD [address] [facility] [place],  [place] [address]  Primary Physician:  Barbette Reichmann, MD  History of Present Illness: 09/05/2023 [name] is here today with a chief complaint of back pain.  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  She has had a severe back pain exacerbation since approximately 10 to 14 days ago.  This is caused her to have significant pain in her back.  She states that she does feel some weakness in her lower extremity and some intermittent tingling but no complete loss of sensation.  No loss of bowel or bladder function.  No loss of perineal sensation.  She does get some claudicatory symptoms when she is ambulating and that has been going on for multiple years.  Conservative measures:  Physical therapy:  not participated  Multimodal medical therapy including regular antiinflammatories:  steriods, hydrocodone Injections:  No ESI  Past Surgery: No past spinal surgery   I have utilized the care everywhere function in epic to review the outside records available from external health systems.  Review of Systems:  A 10 point review of systems is negative, except for the pertinent positives and negatives detailed in the HPI.  Past Medical History: Past Medical History:  Diagnosis Date   Anemia    CHF (congestive heart failure) (HCC)    Chronic kidney disease    peritoneal dialysis   Complication of anesthesia    Coronary artery disease    Diabetes mellitus without complication (HCC)    Dialysis patient [facility])    Peritoneal dialysis patient   Dyspnea    doe   Edema    History of recent blood transfusion 01/2017   Hypercholesterolemia    Hypertension    Hypothyroidism    Neuropathy    PONV (postoperative nausea and vomiting)    Shingles    October-November 2017   Steal syndrome as complication of dialysis access [facility])     Tremors of nervous system    intermittent when taking gabapentin    Past Surgical History: Past Surgical History:  Procedure Laterality Date   A/V FISTULAGRAM Left 01/16/2017   Procedure: A/V Fistulagram;  Surgeon: Renford Dills, MD;  Location: [facility] INVASIVE CV LAB;  Service: Cardiovascular;  Laterality: Left;   A/V SHUNT INTERVENTION N/A 01/16/2017   Procedure: A/V Shunt Intervention;  Surgeon: Renford Dills, MD;  Location: [facility] INVASIVE CV LAB;  Service: Cardiovascular;  Laterality: N/A;   ABDOMINAL HYSTERECTOMY     AV FISTULA PLACEMENT Left 11/30/2016   Procedure: ARTERIOVENOUS (AV) FISTULA CREATION ( EXPLORE FOR CREATION BRACHIOCEPHALIC);  Surgeon: Annice Needy, MD;  Location: [facility] ORS;  Service: Vascular;  Laterality: Left;   CAPD INSERTION N/A 11/25/2015   Procedure: LAPAROSCOPIC INSERTION CONTINUOUS AMBULATORY PERITONEAL DIALYSIS  (CAPD) CATHETER;  Surgeon: Annice Needy, MD;  Location: [facility] ORS;  Service: Vascular;  Laterality: N/A;   CAPD INSERTION N/A 01/06/2016   Procedure: LAPAROSCOPIC INSERTION CONTINUOUS AMBULATORY PERITONEAL DIALYSIS  (CAPD) CATHETER REVISION ;  Surgeon: Annice Needy, MD;  Location: [facility] ORS;  Service: Vascular;  Laterality: N/A;   CAPD INSERTION N/A 10/11/2016   Procedure: LAPAROSCOPIC INSERTION CONTINUOUS AMBULATORY PERITONEAL DIALYSIS  (CAPD) CATHETER ( REVISION );  Surgeon: Annice Needy, MD;  Location: [facility] ORS;  Service: Vascular;  Laterality: N/A;   CARDIAC CATHETERIZATION     CATARACT EXTRACTION W/PHACO Right 05/25/2016   Procedure:  CATARACT EXTRACTION PHACO AND INTRAOCULAR LENS PLACEMENT (IOC);  Surgeon: Nevada Crane, MD;  Location: [facility] ORS;  Service: Ophthalmology;  Laterality: Right;  Lot # [id number] [place]: 00:5.2 AP%: 11.3 CDE: 7.21   CATARACT EXTRACTION W/PHACO Left 12/06/2017   Procedure: CATARACT EXTRACTION PHACO AND INTRAOCULAR LENS PLACEMENT (IOC);  Surgeon: Nevada Crane, MD;  Location: [facility] ORS;  Service: Ophthalmology;  Laterality:  Left;  [place] 00.23.4 AP% 7.9 CDE 1.83 Fluid Pack Lot # [id number]   COLONOSCOPY WITH PROPOFOL N/A 02/15/2017   Procedure: COLONOSCOPY WITH PROPOFOL;  Surgeon: Wyline Mood, MD;  Location: [facility] ENDOSCOPY;  Service: Gastroenterology;  Laterality: N/A;   CORONARY ANGIOPLASTY     STENT   CORONARY STENT INTERVENTION N/A 09/07/2016   Procedure: Coronary Stent Intervention;  Surgeon: Marcina Millard, MD;  Location: [facility] INVASIVE CV LAB;  Service: Cardiovascular;  Laterality: N/A;   DIALYSIS/PERMA CATHETER INSERTION N/A 09/21/2016   Procedure: Dialysis/Perma Catheter Insertion;  Surgeon: Annice Needy, MD;  Location: [facility] INVASIVE CV LAB;  Service: Cardiovascular;  Laterality: N/A;   DIALYSIS/PERMA CATHETER REMOVAL Right 11/30/2016   Procedure: DIALYSIS/PERMA CATHETER REMOVAL;  Surgeon: Annice Needy, MD;  Location: [facility] ORS;  Service: Vascular;  Laterality: Right;   ESOPHAGOGASTRODUODENOSCOPY (EGD) WITH PROPOFOL N/A 02/15/2017   Procedure: ESOPHAGOGASTRODUODENOSCOPY (EGD) WITH PROPOFOL;  Surgeon: Wyline Mood, MD;  Location: [facility] ENDOSCOPY;  Service: Gastroenterology;  Laterality: N/A;   EYE SURGERY     GIVENS CAPSULE STUDY N/A 02/26/2017   Procedure: GIVENS CAPSULE STUDY;  Surgeon: Wyline Mood, MD;  Location: [facility] ENDOSCOPY;  Service: Gastroenterology;  Laterality: N/A;   INSERTION OF DIALYSIS CATHETER N/A 04/07/2018   Procedure: INSERTION OF DIALYSIS CATHETER;  Surgeon: Larina Earthly, MD;  Location: [facility] OR;  Service: Vascular;  Laterality: N/A;   LEFT HEART CATH AND CORONARY ANGIOGRAPHY Left 09/07/2016   Procedure: Left Heart Cath and Coronary Angiography;  Surgeon: Marcina Millard, MD;  Location: [facility] INVASIVE CV LAB;  Service: Cardiovascular;  Laterality: Left;   LIGATION OF ARTERIOVENOUS  FISTULA Left 02/07/2017   Procedure: LIGATION OF ARTERIOVENOUS  FISTULA ( BANDING BRACHIAL CEPHALIC );  Surgeon: Renford Dills, MD;  Location: [facility] ORS;  Service: Vascular;  Laterality: Left;   TUBAL LIGATION      UPPER EXTREMITY ANGIOGRAPHY Left 01/19/2017   Procedure: Upper Extremity Angiography;  Surgeon: Renford Dills, MD;  Location: [facility] INVASIVE CV LAB;  Service: Cardiovascular;  Laterality: Left;    Allergies: Allergies as of 09/05/2023 - Review Complete 09/05/2023  Allergen Reaction Noted   Oxycodone Nausea And Vomiting 12/07/2020   Gabapentin  12/25/2022   Valacyclovir Nausea Only 05/10/2016    Medications:  Current Outpatient Medications:    amLODipine (NORVASC) 10 MG tablet, Take 1 tablet (10 mg total) by mouth daily., Disp: 30 tablet, Rfl: 1   aspirin EC 81 MG tablet, Take 1 tablet (81 mg total) by mouth daily. Start taking after NOV 15, Disp: 30 tablet, Rfl: 0   atorvastatin (LIPITOR) 10 MG tablet, Take 1 tablet by mouth daily., Disp: , Rfl:    carvedilol (COREG) 25 MG tablet, Take 25 mg by mouth 2 (two) times daily with a meal., Disp: , Rfl:    chlorthalidone (HYGROTON) 25 MG tablet, Take 1 tablet by mouth daily., Disp: , Rfl:    estradiol (ESTRACE) 0.1 MG/GM vaginal cream, Place vaginally. Place 2 g vaginally at bedtime, Disp: , Rfl:    furosemide (LASIX) 80 MG tablet, Take 80 mg by mouth daily., Disp: , Rfl:  hydrALAZINE (APRESOLINE) 50 MG tablet, Take 50 mg by mouth in the morning and at bedtime., Disp: , Rfl:    HYDROcodone-acetaminophen (NORCO/VICODIN) 5-325 MG tablet, Take 1-2 tablets by mouth every 6 (six) hours as needed for up to 3 days for severe pain (pain score 7-10)., Disp: 12 tablet, Rfl: 0   insulin aspart (NOVOLOG) 100 UNIT/ML FlexPen, Inject 4-8 Units into the skin 3 (three) times daily with meals. Sliding scale  Below 200 take 4 units 200-250=6 units  >250= 8 units, Disp: , Rfl:    LANTUS SOLOSTAR 100 UNIT/ML Solostar Pen, Inject 8 Units into the skin at bedtime., Disp: , Rfl:    levothyroxine (SYNTHROID, LEVOTHROID) 112 MCG tablet, Take 112 mcg by mouth daily before breakfast. , Disp: , Rfl:    lovastatin (MEVACOR) 40 MG tablet, Take 40 mg by mouth at bedtime.,  Disp: , Rfl:    mycophenolate (CELLCEPT) 250 MG capsule, Take 500 mg by mouth 2 (two) times daily., Disp: , Rfl:    pantoprazole (PROTONIX) 40 MG tablet, Take 1 tablet (40 mg total) by mouth 2 (two) times daily. (Patient taking differently: Take 40 mg by mouth daily. As needed), Disp: 60 tablet, Rfl: 0   predniSONE (DELTASONE) 5 MG tablet, Take 5 mg by mouth daily with breakfast., Disp: , Rfl:    spironolactone (ALDACTONE) 25 MG tablet, Take 1 tablet by mouth daily., Disp: , Rfl:    tamsulosin (FLOMAX) 0.4 MG CAPS capsule, Take 0.4 mg by mouth daily after supper., Disp: , Rfl:    TOUJEO SOLOSTAR 300 UNIT/ML SOPN, Inject 24 Units into the skin Nightly., Disp: , Rfl:    Vitamin D, Ergocalciferol, (DRISDOL) 1.25 MG (50000 UNIT) CAPS capsule, Take 50,000 Units by mouth every 7 (seven) days., Disp: , Rfl:    amLODipine (NORVASC) 5 MG tablet, Take 5 mg by mouth 2 (two) times daily. (Patient not taking: Reported on 09/05/2023), Disp: , Rfl:    calcitRIOL (ROCALTROL) 0.5 MCG capsule, Take 0.5 mcg by mouth daily. (Patient not taking: Reported on 12/25/2022), Disp: , Rfl:    calcium acetate (PHOSLO) 667 MG capsule, Take 2,001 mg by mouth 3 (three) times daily with meals. (Patient not taking: Reported on 12/25/2022), Disp: , Rfl:    carvedilol (COREG) 12.5 MG tablet, Take 12.5 mg by mouth 2 (two) times daily with a meal. (Patient not taking: Reported on 12/25/2022), Disp: , Rfl:    cloNIDine (CATAPRES - DOSED IN MG/24 HR) 0.3 mg/24hr patch, Place 0.3 mg onto the skin once a week. (Patient not taking: Reported on 09/05/2023), Disp: , Rfl:    cloNIDine (CATAPRES) 0.1 MG tablet, Take 0.1 mg by mouth 2 (two) times daily. (Patient not taking: Reported on 09/05/2023), Disp: , Rfl:    fluconazole (DIFLUCAN) 200 MG tablet, Take by mouth. (Patient not taking: Reported on 09/05/2023), Disp: , Rfl:    hydrALAZINE (APRESOLINE) 100 MG tablet, Take 1 tablet (100 mg total) by mouth 2 (two) times daily. (Patient not taking: Reported on  12/25/2022), Disp: 60 tablet, Rfl: 1   irbesartan (AVAPRO) 150 MG tablet, Take 1 tablet (150 mg total) by mouth daily. (Patient not taking: Reported on 12/25/2022), Disp: 30 tablet, Rfl: 1   iron polysaccharides (NIFEREX) 150 MG capsule, Take 150 mg by mouth daily., Disp: , Rfl:    Lidocaine, Anorectal, 5 % CREA, Apply 1 application topically as needed. (Patient not taking: Reported on 09/05/2023), Disp: , Rfl:    metaxalone (SKELAXIN) 800 MG tablet, Take 1 tablet by  mouth 3 (three) times daily. (Patient not taking: Reported on 09/05/2023), Disp: , Rfl:    Multiple Vitamins-Minerals (DIALYVITE 800/ULTRA D) TABS, Take 1 tablet by mouth daily. (Patient not taking: Reported on 09/05/2023), Disp: , Rfl:    ondansetron (ZOFRAN-ODT) 4 MG disintegrating tablet, Take 1 tablet (4 mg total) by mouth every 8 (eight) hours as needed. (Patient not taking: Reported on 09/05/2023), Disp: 20 tablet, Rfl: 0   sevelamer carbonate (RENVELA) 800 MG tablet, Take 1,600 mg by mouth 3 (three) times daily with meals. (Patient not taking: Reported on 12/25/2022), Disp: , Rfl:    tacrolimus (PROGRAF) 1 MG capsule, Take 2 mg by mouth 2 (two) times daily. (Patient not taking: Reported on 09/05/2023), Disp: , Rfl:    torsemide (DEMADEX) 20 MG tablet, Take 40 mg by mouth 2 (two) times daily. (Patient not taking: Reported on 09/05/2023), Disp: , Rfl:    urea (CARMOL) 20 % cream, Apply 1 application topically daily as needed (for dry skin). (Patient not taking: Reported on 09/05/2023), Disp: , Rfl:   Social History: Social History   Tobacco Use   Smoking status: Former    Current packs/day: 0.00    Types: Cigarettes    Quit date: 10/09/2008    Years since quitting: 14.9   Smokeless tobacco: Never  Vaping Use   Vaping status: Never Used  Substance Use Topics   Alcohol use: No   Drug use: No    Family Medical History: Family History  Problem Relation Age of Onset   Diabetes Mother    Breast cancer Neg Hx     Physical  Examination: Vitals:   09/05/23 1423  BP: (!) 120/58    General: Patient is in no apparent distress. Attention to examination is appropriate.  Neck:   Supple.  Full range of motion.  Respiratory: Patient is breathing without any difficulty.   NEUROLOGICAL:     Awake, alert, oriented to person, place, and time.  Speech is clear and fluent.   Cranial Nerves: Pupils equal round and reactive to light.  Facial tone is symmetric.  Facial sensation is symmetric. Shoulder shrug is symmetric. Tongue protrusion is midline.    Strength: Side Biceps Triceps Deltoid Interossei Grip Wrist Ext. Wrist Flex.  R 5 5 5 5 5 5 5   L 5 5 5 5 5 5 5    Side Iliopsoas Quads Hamstring PF DF EHL  R 5 5 5  4+ 4+ 5  L 5 5 5 5  4+ 5   She is hyporeflexic throughout but does have a decrease in her left patella and Achilles reflex compared to the right side which are also hyperreflexic but not as severe  Decreased station in the left sided L5 distribution     Imaging: Narrative & Impression  CLINICAL DATA:  Provided history: Low back pain, cauda equina syndrome suspected. Left lower back pain, exclude disc/cauda equina/fracture. Additional history provided: Left-sided low back pain radiating down left leg.   EXAM: MRI LUMBAR SPINE WITHOUT CONTRAST   TECHNIQUE: Multiplanar, multisequence MR imaging of the lumbar spine was performed. No intravenous contrast was administered.   COMPARISON:  CT abdomen/pelvis 12/25/2022.   FINDINGS: Segmentation: 5 lumbar vertebrae. The caudal most well-formed intervertebral disc space is designated L5-S1.   Alignment:  No significant spondylolisthesis.   Vertebrae: No lumbar vertebral compression fracture. Mild degenerative endplate edema at U7-O5.   Conus medullaris and cauda equina: Conus extends to the L1-L2 level. No signal abnormality identified within the visualized distal spinal cord.  Paraspinal and other soft tissues: No acute finding within  included portions of the abdomen/retroperitoneum. Atrophy of the native kidneys. Partially imaged left lower quadrant renal transplant. No paraspinal mass or collection.   Disc levels:   Multilevel disc degeneration, greatest at L3-L4 (moderate) and L4-L5 (mild-to-moderate).   T12-L1: No significant disc herniation or stenosis.   L1-L2: No significant disc herniation or stenosis.   L2-L3: Slight disc bulge. Superimposed small right foraminal disc protrusion (at site of posterior annular fissure). Small left foraminal disc protrusion. Mild facet arthropathy (predominantly on the right). No significant spinal canal or foraminal stenosis.   L3-L4: Disc bulge. Superimposed left subarticular disc extrusion with caudal migration to the level of the upper/mid L4 vertebral body (for instance as seen on facet arthropathy (for instance as seen on series 5, image 9). Facet arthropathy. Small bilateral facet joint effusions. Ligamentum flavum hypertrophy. Severe central canal stenosis with likely impingement of the descending cauda equina nerve roots. Additionally, the caudally migrated left subarticular disc extrusion contributes to severe left subarticular stenosis, encroaching upon descending left-sided nerve roots (most notably the descending left L4 nerve root). Right subarticular stenosis also present. Moderate left neural foraminal narrowing.   L4-L5: Disc bulge. Superimposed broad-based right subarticular/foraminal disc protrusion (at site of posterior annular fissure). A central posterior annular fissure is also present. Facet arthropathy. Small bilateral facet joint effusions. Ligamentum flavum hypertrophy. Right greater than left subarticular stenosis with potential to affect either descending L5 nerve root. Moderate central canal stenosis. Bilateral neural foraminal narrowing (mild/moderate right, moderate left).   L5-S1: No significant disc herniation, degenerative spinal  canal stenosis or neural foraminal narrowing. Epidural lipomatosis progressively narrows the thecal sac.   Small posteriorly projecting synovial facet cyst at multiple levels within the mid and lower thoracic spine.   IMPRESSION: 1. Lumbar spondylosis as outlined within the body of the report and most notably as follows. 2. At L3-L4, moderate disc degeneration with mild degenerative endplate edema. Multifactorial severe central canal and left subarticular stenosis (with likely cauda equina nerve root impingement). Right subarticular stenosis and moderate left neural foraminal narrowing also present at this level. 3. At L4-L5, multifactorial right greater than left subarticular stenosis (with potential to affect either descending L5 nerve root. Moderate central canal stenosis. Bilateral neural foraminal narrowing (mild/moderate right, moderate left).     Electronically Signed   By: Jackey Loge D.O.   On: 09/04/2023 16:16    I have personally reviewed the images and agree with the above interpretation.  Medical Decision Making/Assessment and Plan: Ms. Burklow is a pleasant 73 y.o. female with an acute exacerbation of low back pain.  She previously had some symptoms of claudication but not severe back pain.  Over the past 14 to 10 days she had such severe back pain that has been difficult for her to get up and out of her chair.  She has not had frank loss of function but has had a sense of lower extremity tingling and potentially some weakness.  On physical examination she is at least 4+ throughout her bilateral lower extremities.  She does have decreased lower extremity reflexes left worse than right.  Has very mild sensation loss.  She does not have any bowel or bladder difficulties.  Does not have any loss of perineal sensation.  At this point given her very mild weakness she does not meet indications for lumbar decompression.  Will plan to have her get flexion-extension x-rays of her  lumbar spine to evaluate for any  spondylosis or spondylolisthesis given the T2 signal noted in her facet joints.  She does have a significant amount of stenosis however with some claudication.  Will plan to have her see physical therapy for conservative trial as well as pain therapy referral for possible injections.  At some point she may need a decompression, pending the results of her flexion-extension x-rays this would potentially need to be augmented with a spinal fusion, however will wait for the final reads and completion of her images.  Will folic to follow-up with her after she has had her pain treated in a conservative fashion.  We reached out to her primary care provider to let them know about her recent exacerbation of pain.   Thank you for involving me in the care of this patient.    Lovenia Kim MD/MSCR Neurosurgery

## 2023-09-04 NOTE — ED Notes (Signed)
Pt to MR

## 2023-09-04 NOTE — Discharge Instructions (Addendum)
You may take 2 tablets at a time of the hydrocodone.  Follow-up with Dr. Katrinka Blazing tomorrow at 2 PM at the [place] [facility] across the road.  His staff will contact you tomorrow to confirm the appointment.  In the meantime return to the ER for new, worsening, or persistent severe pain, any weakness or numbness in your legs, inability urinate, or any other new or worsening symptoms that concern you.

## 2023-09-05 ENCOUNTER — Ambulatory Visit
Admission: RE | Admit: 2023-09-05 | Discharge: 2023-09-05 | Disposition: A | Discharge status: Home / Self Care | Payer: Medicare Other | Attending: Neurosurgery | Admitting: Neurosurgery

## 2023-09-05 ENCOUNTER — Encounter: Payer: Self-pay | Admitting: Neurosurgery

## 2023-09-05 ENCOUNTER — Ambulatory Visit: Payer: Medicare Other | Admitting: Neurosurgery

## 2023-09-05 ENCOUNTER — Ambulatory Visit
Admission: RE | Admit: 2023-09-05 | Discharge: 2023-09-05 | Disposition: A | Discharge status: Home / Self Care | Payer: Medicare Other | Source: Ambulatory Visit | Attending: Neurosurgery | Admitting: Neurosurgery

## 2023-09-05 VITALS — BP 120/58

## 2023-09-05 DIAGNOSIS — M5106 Intervertebral disc disorders with myelopathy, lumbar region: Secondary | ICD-10-CM

## 2023-09-05 DIAGNOSIS — M48062 Spinal stenosis, lumbar region with neurogenic claudication: Secondary | ICD-10-CM | POA: Diagnosis not present

## 2023-09-05 DIAGNOSIS — M5440 Lumbago with sciatica, unspecified side: Secondary | ICD-10-CM | POA: Insufficient documentation

## 2023-09-05 DIAGNOSIS — M51362 Other intervertebral disc degeneration, lumbar region with discogenic back pain and lower extremity pain: Secondary | ICD-10-CM

## 2023-09-05 NOTE — Patient Instructions (Signed)
LOCAL PHYSICAL THERAPY  [facility]  [address]  [place], [place] [address]  [phone]  [facility]  [address] [place], [place] [address]  [phone]  [facility] (2 locations)  [address]  [place], [place] [address]  [phone]          or  [address]  [place], [place] [address]  [phone]  [facility]  [address]  [place], [place] [address]  [phone]  **dry needling**  [facility] ([facility])  [address]  [place], [place] [address]  [phone]  Fax: [phone]  ** Aquatic therapy[address] [place], [place] [address] [phone] **Aquatic therapy**  [facility]  [facility]  [address]  [place], [place] [address]  [phone]  [facility]  [address]  [place], [place] [address]  [phone]  [facility]  [address]   Janora Norlander  [place], [place] [address]  [phone]  [facility]  [address]  [place], [place] [address]  [phone]  **dry needling**   PELVIC FLOOR/SI JOINT  [facility]  Mariane Masters, PT  [email or web address]   [place]  [facility]  [address]  [address] [place], [place] [address]  [phone]   [facility] [place]  [address], [place] [address]  [phone]   [facility], [place]  [facility]  Raymond Gurney, PT  [address]  [place], [place] [address] [phone]   Samara Deist  [facility]  [address]  [phone]   [facility]  [address]  [phone]   [facility]  Annamaria Boots [facility]  [address]  [place], [place] [address]  [phone]   **No Aetna or medicaid**  [facility]  [address] [place] [address]  [phone]  [facility]  [address]  [place], [place] [address]  [phone]  Dannial Monarch, DC  [address] [place], [place] [address]  [phone]  [facility]  [address] [place] [address]  [phone]  [facility] D[facility]  [address]  [place], [place] [address]  [phone]  [facility]  (3 locations)  [address]  [place], [place] [address]  [phone]  **dry needling**           or  [address] [place], [place] [address]  [phone]  **Additionally has Gloris Manchester, OT**           or  [address]  [place], [place] [address]  [phone]  **Pediatric therapy**  [facility]  [address]  [phone]  **dry needlingVerdie Drown [facility]  [address]  [place], [place] [address]  [phone]  [facility]   (Inside [address] [facility])  [address]  [place], [place] [address]  [phone]  **dry needling**  **MEDICAID or UNINSURED** [facility] dept. Of Physical Therapy [place], [place] [address] [phone]  [facility]  [address] [place], [place] [address]  [phone]   [facility]  [address]  [place], [place] [address]  [phone]   Doreatha Martin  [facility]  [address] [place], [place] [address]  [phone]   [facility]  [address]  [place], [place] [address]  [phone]   [facility]  [address] [place], [place] [address]  [phone]  [facility]  [address]  [place], [place] [address]  [phone]  AQUATIC  Kathalene Frames [facility]  [employer]  [facility]  [place]  [place]  *Residents only*   [facility] [employer]  *[facility]  Exercise class  [facility]  Exercise class  [facility]  [address]  [place], [place]   [phone]  [facility]  [address]  [place], [place] [address]  [phone]   [place], [place]  [facility]  [address]  [phone]   [facility]  [facility]  [address]  [phone]           or  [address]  [phone]   [facility] Arthritis Support Group   Provides education and support and practical information for coping with arthritis for arthritis sufferers and their families.   When: 12:15 - 1:30 p.m. the second Monday of each month, March through December  Info: Call Rehabilitation Services at [phone]

## 2023-09-06 ENCOUNTER — Encounter: Payer: Self-pay | Admitting: Emergency Medicine

## 2023-09-06 ENCOUNTER — Other Ambulatory Visit: Payer: Self-pay

## 2023-09-06 ENCOUNTER — Emergency Department
Admission: EM | Admit: 2023-09-06 | Discharge: 2023-09-06 | Disposition: A | Discharge status: Home / Self Care | Payer: Medicare Other | Attending: Emergency Medicine | Admitting: Emergency Medicine

## 2023-09-06 DIAGNOSIS — Z940 Kidney transplant status: Secondary | ICD-10-CM | POA: Insufficient documentation

## 2023-09-06 DIAGNOSIS — M79605 Pain in left leg: Secondary | ICD-10-CM | POA: Diagnosis present

## 2023-09-06 DIAGNOSIS — E119 Type 2 diabetes mellitus without complications: Secondary | ICD-10-CM | POA: Diagnosis not present

## 2023-09-06 DIAGNOSIS — M5416 Radiculopathy, lumbar region: Secondary | ICD-10-CM | POA: Diagnosis not present

## 2023-09-06 DIAGNOSIS — I2510 Atherosclerotic heart disease of native coronary artery without angina pectoris: Secondary | ICD-10-CM | POA: Insufficient documentation

## 2023-09-06 DIAGNOSIS — I10 Essential (primary) hypertension: Secondary | ICD-10-CM | POA: Insufficient documentation

## 2023-09-06 MED ORDER — LIDOCAINE 5 % EX PTCH: 1.0000 | MEDICATED_PATCH | Freq: Two times a day (BID) | CUTANEOUS | 0 refills | Status: AC

## 2023-09-06 MED ORDER — OXYCODONE-ACETAMINOPHEN 5-325 MG PO TABS
1.0000 | ORAL_TABLET | Freq: Once | ORAL | Status: AC
  Administered 2023-09-06: 1 via ORAL
  Filled 2023-09-06: qty 1

## 2023-09-06 MED ORDER — OXYCODONE HCL 5 MG PO TABS: 5.0000 mg | ORAL_TABLET | Freq: Three times a day (TID) | ORAL | 0 refills | Status: AC | PRN

## 2023-09-06 MED ORDER — ONDANSETRON 4 MG PO TBDP
4.0000 mg | ORAL_TABLET | Freq: Once | ORAL | Status: AC
  Administered 2023-09-06: 4 mg via ORAL
  Filled 2023-09-06: qty 1

## 2023-09-06 MED ORDER — LIDOCAINE 5 % EX PTCH
1.0000 | MEDICATED_PATCH | CUTANEOUS | Status: DC
Start: 2023-09-06 — End: 2023-09-06
  Administered 2023-09-06: 1 via TRANSDERMAL
  Filled 2023-09-06: qty 1

## 2023-09-06 NOTE — ED Triage Notes (Signed)
Pt to ED via [facility] from home for Left leg and back pain x 10 days. Pt seen here and at urgent care for same. Pt has hx/o DM and took her insulin just prior to coming to ED.  [facility] 237

## 2023-09-06 NOTE — ED Provider Notes (Signed)
[facility] Provider Note    Event Date/Time   First MD Initiated Contact with Patient 09/06/23 [phone]     (approximate)   History   Chief Complaint Leg Pain   HPI  Norena Bratton is a 73 y.o. female with past medical history of hypertension, diabetes, CAD, and renal transplant who presents to the ED complaining of leg pain.  Patient ports that for the past 10 days she has had severe pain in the left side of her lower back and radiating down the back of her left leg.  She was seen in the ED for the symptoms 2 days ago, had an MRI at that time and was referred to neurosurgery.  She was also seen by Dr. Katrinka Blazing of neurosurgery yesterday, found to have some very mild weakness in her left leg but otherwise no concerning findings and was referred to pain management as well as her PCP for physical therapy.  Patient reports ongoing pain since then despite taking hydrocodone and Tylenol.  She denies any new numbness or weakness in her leg, has not had any numbness in her groin or bowel/bladder incontinence.     Physical Exam   Triage Vital Signs: ED Triage Vitals  Encounter Vitals Group     BP 09/06/23 0818 (!) 166/49     Systolic BP Percentile --      Diastolic BP Percentile --      Pulse Rate 09/06/23 0818 66     Resp 09/06/23 0818 18     Temp 09/06/23 0818 97.9 F (36.6 C)     Temp Source 09/06/23 0818 Oral     SpO2 09/06/23 0818 100 %     Weight 09/06/23 0817 127 lb 13.9 oz (58 kg)     Height 09/06/23 0817 5' (1.524 m)     Head Circumference --      Peak Flow --      Pain Score 09/06/23 0816 10     Pain Loc --      Pain Education --      Exclude from Growth Chart --     Most recent vital signs: Vitals:   09/06/23 0818  BP: (!) 166/49  Pulse: 66  Resp: 18  Temp: 97.9 F (36.6 C)  SpO2: 100%    Constitutional: Alert and oriented. Eyes: Conjunctivae are normal. Head: Atraumatic. Nose: No congestion/rhinnorhea. Mouth/Throat: Mucous  membranes are moist.  Cardiovascular: Normal rate, regular rhythm. Grossly normal heart sounds.  2+ radial and DP pulses bilaterally. Respiratory: Normal respiratory effort.  No retractions. Lungs CTAB. Gastrointestinal: Soft and nontender. No distention. Musculoskeletal: No lower extremity tenderness nor edema.  Lumbar paraspinal tenderness noted on left. Neurologic:  Normal speech and language. No gross focal neurologic deficits are appreciated.    ED Results / Procedures / Treatments   Labs (all labs ordered are listed, but only abnormal results are displayed) Labs Reviewed - No data to display   PROCEDURES:  Critical Care performed: No  Procedures   MEDICATIONS ORDERED IN ED: Medications  lidocaine (LIDODERM) 5 % 1 patch (1 patch Transdermal Patch Applied 09/06/23 0931)  oxyCODONE-acetaminophen (PERCOCET/ROXICET) 5-325 MG per tablet 1 tablet (1 tablet Oral Given 09/06/23 0931)  ondansetron (ZOFRAN-ODT) disintegrating tablet 4 mg (4 mg Oral Given 09/06/23 0931)     IMPRESSION / MDM / ASSESSMENT AND PLAN / ED COURSE  I reviewed the triage vital signs and the nursing notes.  73 y.o. female with past medical history of hypertension, diabetes, CAD, and renal transplant who presents to the ED complaining of ongoing left lower back pain radiating down her left leg for the past 10 days.  Patient's presentation is most consistent with acute presentation with potential threat to life or bodily function.  Differential diagnosis includes, but is not limited to, cauda equina, spinal stenosis, lumbar radiculopathy, lumbar strain.  Patient nontoxic-appearing and in no acute distress, vital signs are unremarkable.  She is neurovascular intact to her bilateral lower extremities, I am not able to appreciate any weakness to her legs.  She did have flexion and extension x-rays performed yesterday and these images were reviewed with Dr. Katrinka Blazing of neurosurgery.  He  does not see any acute abnormality, states x-ray results would not change management at this time.  No concerns for cauda equina clinically and patient's primary issue seems to be pain control.  We will give dose of Percocet, place Lidoderm patch, and reassess.  Lumbar X-rays from yesterday were reviewed with Dr. Katrinka Blazing of neurosurgery, who agrees no acute findings noted that would change management at this time.  He has previously provided with referral to pain management as well as back to patient's PCP for physical therapy.  Patient able to stand and bear weight without difficulty here in the ED and is appropriate for outpatient management, has a walker available at home.  Husband counseled to have her follow-up and to return to the ED for new or worsening symptoms, husband agrees with plan.      FINAL CLINICAL IMPRESSION(S) / ED DIAGNOSES   Final diagnoses:  Lumbar radiculopathy     Rx / DC Orders   ED Discharge Orders          Ordered    oxyCODONE (ROXICODONE) 5 MG immediate release tablet  Every 8 hours PRN        09/06/23 1106    lidocaine (LIDODERM) 5 %  Every 12 hours        09/06/23 1106             Note:  This document was prepared using Dragon voice recognition software and may include unintentional dictation errors.   Chesley Noon, MD 09/06/23 [phone]

## 2023-09-06 NOTE — ED Notes (Signed)
See triage note  Presents with lower back pain which is moving onto left leg  Was seen 2 days ago for same  States pian is not any better

## 2023-09-07 ENCOUNTER — Telehealth: Payer: Self-pay | Admitting: Neurosurgery

## 2023-09-07 NOTE — Telephone Encounter (Signed)
I think you should just be able to use the existing PT referral to send to them.

## 2023-09-07 NOTE — Telephone Encounter (Signed)
Bethany Lee is calling, his insurance told them that they should request a referral for [facility] PT because she is into much pain to go out of her home. Can you send a new referral for [facility] PT eval.

## 2023-09-07 NOTE — Telephone Encounter (Signed)
Renae Fickle from [facility] is calling on the patient's behalf to request a referral to [facility].

## 2023-09-12 DIAGNOSIS — Z940 Kidney transplant status: Principal | ICD-10-CM

## 2023-09-12 DIAGNOSIS — M899 Disorder of bone, unspecified: Principal | ICD-10-CM

## 2023-09-12 DIAGNOSIS — N189 Chronic kidney disease, unspecified: Principal | ICD-10-CM

## 2023-09-12 DIAGNOSIS — Z794 Long term (current) use of insulin: Principal | ICD-10-CM

## 2023-09-12 DIAGNOSIS — Z79899 Other long term (current) drug therapy: Principal | ICD-10-CM

## 2023-09-12 DIAGNOSIS — E1122 Type 2 diabetes mellitus with diabetic chronic kidney disease: Principal | ICD-10-CM

## 2023-09-13 ENCOUNTER — Other Ambulatory Visit: Payer: Self-pay

## 2023-09-13 ENCOUNTER — Telehealth: Payer: Self-pay | Admitting: Neurosurgery

## 2023-09-13 ENCOUNTER — Emergency Department
Admission: EM | Admit: 2023-09-13 | Discharge: 2023-09-14 | Disposition: A | Discharge status: Home / Self Care | Payer: Medicare Other | Attending: Emergency Medicine | Admitting: Emergency Medicine

## 2023-09-13 DIAGNOSIS — M5442 Lumbago with sciatica, left side: Secondary | ICD-10-CM | POA: Diagnosis not present

## 2023-09-13 DIAGNOSIS — M5416 Radiculopathy, lumbar region: Secondary | ICD-10-CM | POA: Diagnosis not present

## 2023-09-13 DIAGNOSIS — M5440 Lumbago with sciatica, unspecified side: Secondary | ICD-10-CM

## 2023-09-13 DIAGNOSIS — M5106 Intervertebral disc disorders with myelopathy, lumbar region: Secondary | ICD-10-CM

## 2023-09-13 DIAGNOSIS — M545 Low back pain, unspecified: Secondary | ICD-10-CM | POA: Diagnosis present

## 2023-09-13 NOTE — ED Triage Notes (Signed)
 Pt to ED via [facility] from home for nerve pain that pt was seen for here on 09/06/2023. Pt did not schedule follow up appt with MD as discharge instructions directed.   Pt is experiencing pain in left leg.   Vitals within normal limits for EMS.

## 2023-09-13 NOTE — ED Notes (Signed)
Ed Provider at bedside.  

## 2023-09-13 NOTE — Telephone Encounter (Signed)
 Referral also placed for Social work evaluation with Child psychotherapist at [employer].

## 2023-09-13 NOTE — ED Provider Notes (Addendum)
 [facility] Provider Note    Event Date/Time   First MD Initiated Contact with Patient 09/13/23 2306     (approximate)   History   Leg Pain (Left leg nerve pain.)   HPI  Bethany Lee is a 73 y.o. female   Past medical history of subacute lower back pain with recent MRI last week and subsequent neurosurgery evaluation most recently on 09/05/2023 by Dr. Ernestine Mcmurray noting a plan for "very mild weakness does not meet indications for lumbar decompression, will plan to have her get flexion-extension x-rays of her lumbar spine... Physical therapy for conservative trial as well as pain therapy referral for possible injections. At some point she may need a decompression pending the results of her x-rays."  The patient states that her symptoms are unchanged but still quite bothersome.  This involves painful sensation in the left lower back that radiates down the left lower leg.  She continues to deny incontinence and does continue to have mild weakness to the left lower extremity.  She denies any new trauma since her last imaging.  External Medical Documents Reviewed: Neurosurgery note as above, from an appointment dated 09/05/2023      Physical Exam   Triage Vital Signs: ED Triage Vitals  Encounter Vitals Group     BP 09/13/23 2238 (!) 155/46     Systolic BP Percentile --      Diastolic BP Percentile --      Pulse Rate 09/13/23 2238 73     Resp 09/13/23 2238 20     Temp --      Temp src --      SpO2 09/13/23 2238 100 %     Weight 09/13/23 2238 127 lb 13.9 oz (58 kg)     Height 09/13/23 2238 5' (1.524 m)     Head Circumference --      Peak Flow --      Pain Score 09/13/23 2234 10     Pain Loc --      Pain Education --      Exclude from Growth Chart --     Most recent vital signs: Vitals:   09/13/23 2238  BP: (!) 155/46  Pulse: 73  Resp: 20  SpO2: 100%    General: Awake, no distress.  CV:  Good peripheral perfusion.  Resp:  Normal  effort.  Abd:  No distention.  Other:  Patient laying comfortably in the stretcher with slight hypertension otherwise vital signs normal.  She has no midline tenderness to the L-spine no obvious deformity or step-off.  She has full active range of motion at the hip knee and ankle bilateral lower extremities though is noted to be slightly weaker to the left lower extremity than the right.  Sensation is intact bilaterally but she notes that there is a sensory decrease in the left lower extremity.  These findings are consistent with neurosurgery documented examination from 09/05/2023   ED Results / Procedures / Treatments   Labs (all labs ordered are listed, but only abnormal results are displayed) Labs Reviewed - No data to display  PROCEDURES:  Critical Care performed: No  Procedures   MEDICATIONS ORDERED IN ED: Medications - No data to display   IMPRESSION / MDM / ASSESSMENT AND PLAN / ED COURSE  I reviewed the triage vital signs and the nursing notes.  Patient's presentation is most consistent with acute presentation with potential threat to life or bodily function.  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  Patient reports that her symptoms are unchanged but still quite bothersome to her.  She has had no incontinence, worsening sensory loss, or weakness and so given unchanged symptoms with recent evaluation by neurosurgery noting conservative management trial prior to  consideration of surgery, I do not think that she needs emergency surgery tonight.  Furthermore the patient is very hesitant for surgery and would like to pursue all conservative therapies prior to considering surgery.  I asked her to follow-up with her neurosurgeon (I sent Dr Katrinka Blazing an inbox message about tonight's visit)  and her PMD to ensure prompt conservative trials.  I also reemphasized she should come back to the emergency department for reevaluation should she develop any worsening weakness, sensory loss, incontinence.    FINAL CLINICAL IMPRESSION(S) / ED DIAGNOSES   Final diagnoses:  Lumbar radiculopathy  Acute left-sided low back pain with left-sided sciatica     Rx / DC Orders   ED Discharge Orders     None        Note:  This document was prepared using Dragon voice recognition software and may include unintentional dictation errors.    Pilar Jarvis, MD 09/13/23 2956    Pilar Jarvis, MD 09/13/23 [phone]

## 2023-09-13 NOTE — Telephone Encounter (Signed)
 I discussed with Thayer Ohm with [facility]. Verbal orders given for DME. Thayer Ohm states that we can fax them the order for the equipment and they will take care of getting these for the patient. Verbal orders given to continue [facility] as well as gave ok to have [facility] evaluate the patient.

## 2023-09-13 NOTE — Telephone Encounter (Signed)
 Chris from [facility] is calling to request verbal PT orders of 2x4 and 1x5. He would also like to request a nurse for home health to help with medication and diabetes management. He is also requesting a front wheeled walker and a 16 inch wheelchair. He states that at the initial visit with her, she was not able to get out of bed and reported 10/10 pain but was able to turn in bed without wincing or guarding. He also states that the patient is not getting out of the bed to use the bathroom or to eat. He states that she is not eating much at all and is not taking her sliding scale insulin with her meals three times a day and is only taking her night time insulin. He reports consistent blood sugar readings over 200.

## 2023-09-13 NOTE — Discharge Instructions (Addendum)
 Please take all medications as prescribed previously.  Please call your primary doctor and Dr. Ernestine Mcmurray of spinal clinic to ensure prompt management with pain medications, rehab, injections as planned by Dr. Katrinka Blazing in the previous appointment.  Thank you for choosing [place] for your health care today!  Please see your primary doctor this week for a follow up appointment.   If you have any new, worsening, or unexpected symptoms call your doctor right away or come back to the emergency department for reevaluation.  It was my pleasure to care for you today.   Daneil Dan Modesto Charon, MD

## 2023-09-14 ENCOUNTER — Telehealth: Payer: Self-pay | Admitting: Neurosurgery

## 2023-09-14 DIAGNOSIS — M25552 Pain in left hip: Secondary | ICD-10-CM

## 2023-09-14 MED ORDER — KETOROLAC TROMETHAMINE 15 MG/ML IJ SOLN
15.0000 mg | Freq: Once | INTRAMUSCULAR | Status: AC
  Administered 2023-09-14: 15 mg via INTRAMUSCULAR
  Filled 2023-09-14: qty 1

## 2023-09-14 MED ORDER — DEXAMETHASONE SODIUM PHOSPHATE 10 MG/ML IJ SOLN
10.0000 mg | Freq: Once | INTRAMUSCULAR | Status: AC
  Administered 2023-09-14: 10 mg via INTRAMUSCULAR
  Filled 2023-09-14: qty 1

## 2023-09-14 NOTE — Telephone Encounter (Signed)
 I spoke to Miss Watanabe's daughter. I told her that Dr. Katrinka Blazing cannot prescribe narcotics medication. PCP is deferring medical management to our practice per daughter. Telephone call on Monday with Dr. Katrinka Blazing. She asked what other medications we can provide.   Per further discussion with Dr.Smith, he will call the patient over the weekend. He is reviewing her chart to see if there are any medications that we can prescribe.

## 2023-09-14 NOTE — Telephone Encounter (Signed)
 Bethany Lee her daughter is calling, patient needs a refill on oxycodone 5mg  every 8 hours. She needs something stronger or needs the medication to be increased toevery 4 hours. She is in excruciating pain and the pain has her bedridden now.  [facility] [address]

## 2023-09-14 NOTE — Telephone Encounter (Signed)
 Dr. Katrinka Blazing will call patient's daughter to discuss.

## 2023-09-15 ENCOUNTER — Encounter: Payer: Self-pay | Admitting: Neurosurgery

## 2023-09-15 DIAGNOSIS — M25552 Pain in left hip: Secondary | ICD-10-CM | POA: Insufficient documentation

## 2023-09-15 NOTE — Telephone Encounter (Signed)
 I was able to have a discussion with the patient's daughter.  They were at home and I was in my home office.  She gave consent to go forward with the phone discussion.  We discussed her complicated medical issues and that many of the medications that we give for back pain exacerbations are contraindicated in renal failure including muscle relaxants and NSAIDs.  Most opioid medications are not overly helpful for neuropathic based pain she was previously had discussion about possible gabapentin therapy based off of renal dosing but was not interested.  Upon discussion with the daughter she did state that her mother, our patient Bethany Lee, let her know about a fall that started most of her pain.  She had been on steps and falling backwards striking her left hip and knee and states that this is where majority of her pain is.  It starts at the upper buttocks/outside of her hip and radiates down the lateral aspect of her thigh.  She has not had any imaging of her hip as her hip trauma was unknown to the team, I am planning to put in x-rays of her hip now to evaluate for any trauma given the additional history we were given today.  She has an appointment with her nephrologist on Monday which I asked to seek guidance for options for pain management in the setting of her kidney transplant, and renal failure.  We also had a long discussion about if she does need a spinal intervention that it would be high risk given her immunocompromised status, poor renal function, and overall health.  The likelihood of a spinal infection would be quite high in the setting of her systemic issues.  We did discuss the risk of infection during spinal surgery.  I would like to rule out any hip pathology in the setting of the previously unknown history of the fall from the stairs and striking her hip.

## 2023-09-17 ENCOUNTER — Encounter: Admit: 2023-09-17 | Discharge: 2023-09-17 | Payer: MEDICARE

## 2023-09-17 ENCOUNTER — Ambulatory Visit
Admission: RE | Admit: 2023-09-17 | Discharge: 2023-09-17 | Disposition: A | Discharge status: Home / Self Care | Payer: Medicare Other | Source: Ambulatory Visit | Attending: Neurosurgery | Admitting: Neurosurgery

## 2023-09-17 ENCOUNTER — Ambulatory Visit (INDEPENDENT_AMBULATORY_CARE_PROVIDER_SITE_OTHER): Payer: Medicare Other | Admitting: Neurosurgery

## 2023-09-17 ENCOUNTER — Other Ambulatory Visit: Payer: Self-pay | Admitting: Neurosurgery

## 2023-09-17 DIAGNOSIS — M25552 Pain in left hip: Secondary | ICD-10-CM | POA: Insufficient documentation

## 2023-09-17 DIAGNOSIS — M5440 Lumbago with sciatica, unspecified side: Secondary | ICD-10-CM

## 2023-09-17 DIAGNOSIS — M545 Low back pain, unspecified: Secondary | ICD-10-CM | POA: Diagnosis not present

## 2023-09-17 NOTE — Progress Notes (Signed)
 I had a follow-up phone visit today with Bethany Lee and her family.  They were at home and I was in the office.  They consented to go forward with the phone visit to discuss her hip and back pain.  They state that her back pain is gotten significantly better, she still does continue to have some intermittent hip pain.  But overall she is doing better.  She has not yet had her x-rays or her physical therapy that we ordered.  She is going to [facility] today for an appointment.  They like to continue to follow-up with her progress.  We spent 10 minutes discussing her care.

## 2023-09-20 ENCOUNTER — Telehealth: Payer: Self-pay | Admitting: Neurosurgery

## 2023-09-20 ENCOUNTER — Encounter: Payer: Self-pay | Admitting: Neurosurgery

## 2023-09-20 NOTE — Telephone Encounter (Signed)
 Patients daughter has called the office again regarding her mother's CT scan. I informed her that we are waiting on the report to come back from the radiology department. She asked if there was anything we could do to help speed this along. States her mother is bed-ridden, wearing depends, and in a lot of pain.

## 2023-09-20 NOTE — Telephone Encounter (Signed)
   Left Wynona Canes a message to call our office back.

## 2023-09-20 NOTE — Telephone Encounter (Signed)
 The patient's husband called the office, he was not aware of the of what was said to the daughter. I read him the mychart message about the xray results. He informed me that she is doing good with physical therapy, has an evaluation with Dr.Lateef, and she is doing much better. He states that here and there the patient does have a little pain when sitting up/walking, but nothing too bad. He also found his own wheelchair so he does not need that but will still need the walker for her. He would like for [place] to speak with him more so than the daughter because he sees the everyday progress

## 2023-09-25 DIAGNOSIS — Z940 Kidney transplant status: Principal | ICD-10-CM

## 2023-10-04 ENCOUNTER — Encounter: Payer: Self-pay | Admitting: Student in an Organized Health Care Education/Training Program

## 2023-10-04 ENCOUNTER — Ambulatory Visit
Payer: Medicare Other | Attending: Student in an Organized Health Care Education/Training Program | Admitting: Student in an Organized Health Care Education/Training Program

## 2023-10-04 VITALS — BP 126/64 | HR 70 | Temp 97.9°F | Ht 60.0 in | Wt 127.0 lb

## 2023-10-04 DIAGNOSIS — G8929 Other chronic pain: Secondary | ICD-10-CM | POA: Diagnosis not present

## 2023-10-04 DIAGNOSIS — M48062 Spinal stenosis, lumbar region with neurogenic claudication: Secondary | ICD-10-CM | POA: Diagnosis present

## 2023-10-04 DIAGNOSIS — M48061 Spinal stenosis, lumbar region without neurogenic claudication: Secondary | ICD-10-CM | POA: Diagnosis present

## 2023-10-04 DIAGNOSIS — M5416 Radiculopathy, lumbar region: Secondary | ICD-10-CM | POA: Insufficient documentation

## 2023-10-04 NOTE — Patient Instructions (Signed)
   ______________________________________________________________________    Procedure instructions  Stop blood-thinners  Do not eat or drink fluids (other than water) for 6 hours before your procedure  No water for 2 hours before your procedure  Take your blood pressure medicine with a sip of water  Arrive 30 minutes before your appointment  If sedation is planned, bring suitable driver. Pennie Banter, Benedetto Goad, & public transportation are NOT APPROVED)  Carefully read the "Preparing for your procedure" detailed instructions  If you have questions call us at [phone]  Procedure appointments are for procedures only. NO medication refills or new problem evaluations.   ______________________________________________________________________

## 2023-10-04 NOTE — Progress Notes (Signed)
 Safety precautions to be maintained throughout the outpatient stay will include: orient to surroundings, keep bed in low position, maintain call bell within reach at all times, provide assistance with transfer out of bed and ambulation.

## 2023-10-04 NOTE — Progress Notes (Signed)
 Patient: Bethany Lee  Service Category: E/M  Provider: Edward Jolly, MD  DOB: April 13, 1951  DOS: 10/04/2023  Referring Provider: Lovenia Kim, MD  MRN: [id number]  Setting: Ambulatory outpatient  PCP: Barbette Reichmann, MD  Type: New Patient  Specialty: Interventional Pain Management    Location: Office  Delivery: Face-to-face     Primary Reason(s) for Visit: Encounter for initial evaluation of one or more chronic problems (new to examiner) potentially causing chronic pain, and posing a threat to normal musculoskeletal function. (Level of risk: High) CC: Hip Pain (left)  HPI  Bethany Lee is a 73 y.o. year old, female patient, who comes for the first time to our practice referred by Lovenia Kim, MD for our initial evaluation of her chronic pain. She has Proteinuria; Anemia in chronic renal disease; Myoclonic jerking; ESRD on dialysis [facility]); Peritoneal dialysis status (HCC); Leg swelling; Anemia of chronic disease; Diabetes mellitus (HCC); Hyperkalemia; Acute pulmonary edema (HCC); CAD (coronary artery disease); Acute respiratory failure (HCC); Acute respiratory distress; GI bleeding; Essential hypertension; GIB (gastrointestinal bleeding); Acute blood loss anemia; Acute combined systolic and diastolic congestive heart failure (HCC); CKD (chronic kidney disease) stage 5, GFR less than 15 ml/min (HCC); DM type 2 with diabetic peripheral neuropathy (HCC); Elevated triglycerides with high cholesterol; Hypothyroidism; Mixed hyperlipidemia; Peripheral neuropathy; S/P drug eluting coronary stent placement; Subclavian arterial stenosis (HCC); Type 2 diabetes mellitus with diabetic nephropathy, with long-term current use of insulin (HCC); Complication from renal dialysis device; Peritoneal dialysis catheter dysfunction [facility]); PD catheter dysfunction (HCC); Steal syndrome as complication of dialysis access [facility]); Abdominal distention; Hyponatremia; Dyspnea; Acidosis; Hypertensive urgency; Flash pulmonary  edema (HCC); Acute hypoxemic respiratory failure (HCC); and Pain of left hip on their problem list. Today she comes in for evaluation of her Hip Pain (left)  Pain Assessment: Location: Left Hip Radiating: radiates down left leg to calf Onset: More than a month ago Duration: Chronic pain Quality: Sharp Severity: 0-No pain/10 (subjective, self-reported pain score)  Effect on ADL:   Timing: Constant Modifying factors: laying down BP: 126/64  HR: 70  Onset and Duration: Gradual and Present less than 3 months Cause of pain: Unknown Severity: Getting better, NAS-11 at its worse: 10/10, NAS-11 at its best: 0/10, NAS-11 now: 0/10, and NAS-11 on the average: 0/10 Timing: Not influenced by the time of the day and During activity or exercise Aggravating Factors: Motion and Prolonged sitting Alleviating Factors: Lying down Associated Problems: none reported Quality of Pain: Dull Previous Examinations or Tests: MRI scan and X-rays Previous Treatments: The patient denies any treatment  Bethany Lee is being evaluated for possible interventional pain management therapies for the treatment of her chronic pain.  Discussed the use of AI scribe software for clinical note transcription with the patient, who gave verbal consent to proceed.  History of Present Illness   The patient presents with low back, left hip and left leg pain and mobility issues. She is accompanied by a caregiver who provides details about her condition.  Of note, the patient was experiencing severe lumbar radicular pain that prompted her to go to the emergency department on 09/04/2023, 09/06/2023 and 09/13/2023.  She was evaluated by neurosurgery.  A referral to the pain clinic was made however somewhat surprisingly, all of her pain symptoms have subsided and she is not having any pain today.  She still wanted to come in today and discussed what transpired in case it happens in the future.  Of note she is a type II  diabetic.  She  experienced severe pain in her left hip, which led to her being bedridden for nearly a month. During this time, she was unable to sit up without experiencing intolerable pain. The pain was associated with a noticeable bulge or swelling in the hip area. Over time, the pain and swelling gradually subsided, allowing her to sit up and eventually move with assistance.  She began using a wheelchair to transition from the bedroom to the living room, where she could sit in a recliner for extended periods without severe pain. Initially, she could only sit for about three hours before the pain returned, but this duration increased as her condition improved. She started practicing walking by holding onto the bed for support and gradually progressed to walking down the hallway with assistance. Despite improvements, she still experiences some weakness in her legs, particularly in one leg, and does not appear steady on her feet.  An MRI conducted in the hospital revealed nerve compression, which correlates with her symptoms. Despite the severity of the initial condition, there has been a gradual improvement in her symptoms.  She is currently taking a baby aspirin.       Meds   Current Outpatient Medications:    amLODipine (NORVASC) 5 MG tablet, Take 5 mg by mouth 2 (two) times daily., Disp: , Rfl:    aspirin EC 81 MG tablet, Take 1 tablet (81 mg total) by mouth daily. Start taking after NOV 15, Disp: 30 tablet, Rfl: 0   atorvastatin (LIPITOR) 10 MG tablet, Take 1 tablet by mouth daily., Disp: , Rfl:    carvedilol (COREG) 25 MG tablet, Take 25 mg by mouth 2 (two) times daily with a meal., Disp: , Rfl:    chlorthalidone (HYGROTON) 25 MG tablet, Take 1 tablet by mouth daily., Disp: , Rfl:    estradiol (ESTRACE) 0.1 MG/GM vaginal cream, Place vaginally. Place 2 g vaginally at bedtime, Disp: , Rfl:    hydrALAZINE (APRESOLINE) 50 MG tablet, Take 50 mg by mouth in the morning and at bedtime., Disp: , Rfl:    insulin  aspart (NOVOLOG) 100 UNIT/ML FlexPen, Inject 4-8 Units into the skin 3 (three) times daily with meals. Sliding scale  Below 200 take 4 units 200-250=6 units  >250= 8 units, Disp: , Rfl:    levothyroxine (SYNTHROID, LEVOTHROID) 112 MCG tablet, Take 112 mcg by mouth daily before breakfast. , Disp: , Rfl:    lovastatin (MEVACOR) 40 MG tablet, Take 40 mg by mouth at bedtime., Disp: , Rfl:    mycophenolate (CELLCEPT) 250 MG capsule, Take 500 mg by mouth 2 (two) times daily., Disp: , Rfl:    pantoprazole (PROTONIX) 40 MG tablet, Take 1 tablet (40 mg total) by mouth 2 (two) times daily. (Patient taking differently: Take 40 mg by mouth daily. As needed), Disp: 60 tablet, Rfl: 0   predniSONE (DELTASONE) 5 MG tablet, Take 5 mg by mouth daily with breakfast., Disp: , Rfl:    spironolactone (ALDACTONE) 25 MG tablet, Take 1 tablet by mouth daily., Disp: , Rfl:    TOUJEO SOLOSTAR 300 UNIT/ML SOPN, Inject 24 Units into the skin Nightly., Disp: , Rfl:    Vitamin D, Ergocalciferol, (DRISDOL) 1.25 MG (50000 UNIT) CAPS capsule, Take 50,000 Units by mouth every 7 (seven) days., Disp: , Rfl:    amLODipine (NORVASC) 10 MG tablet, Take 1 tablet (10 mg total) by mouth daily. (Patient not taking: Reported on 10/04/2023), Disp: 30 tablet, Rfl: 1   calcitRIOL (ROCALTROL) 0.5 MCG  capsule, Take 0.5 mcg by mouth daily. (Patient not taking: Reported on 12/25/2022), Disp: , Rfl:    calcium acetate (PHOSLO) 667 MG capsule, Take 2,001 mg by mouth 3 (three) times daily with meals. (Patient not taking: Reported on 12/25/2022), Disp: , Rfl:    carvedilol (COREG) 12.5 MG tablet, Take 12.5 mg by mouth 2 (two) times daily with a meal. (Patient not taking: Reported on 10/04/2023), Disp: , Rfl:    cloNIDine (CATAPRES - DOSED IN MG/24 HR) 0.3 mg/24hr patch, Place 0.3 mg onto the skin once a week. (Patient not taking: Reported on 09/05/2023), Disp: , Rfl:    cloNIDine (CATAPRES) 0.1 MG tablet, Take 0.1 mg by mouth 2 (two) times daily. (Patient not  taking: Reported on 09/05/2023), Disp: , Rfl:    fluconazole (DIFLUCAN) 200 MG tablet, Take by mouth. (Patient not taking: Reported on 09/05/2023), Disp: , Rfl:    furosemide (LASIX) 80 MG tablet, Take 80 mg by mouth daily. (Patient not taking: Reported on 10/04/2023), Disp: , Rfl:    hydrALAZINE (APRESOLINE) 100 MG tablet, Take 1 tablet (100 mg total) by mouth 2 (two) times daily. (Patient not taking: Reported on 12/25/2022), Disp: 60 tablet, Rfl: 1   irbesartan (AVAPRO) 150 MG tablet, Take 1 tablet (150 mg total) by mouth daily. (Patient not taking: Reported on 12/25/2022), Disp: 30 tablet, Rfl: 1   iron polysaccharides (NIFEREX) 150 MG capsule, Take 150 mg by mouth daily. (Patient not taking: Reported on 10/04/2023), Disp: , Rfl:    LANTUS SOLOSTAR 100 UNIT/ML Solostar Pen, Inject 8 Units into the skin at bedtime. (Patient not taking: Reported on 10/04/2023), Disp: , Rfl:    lidocaine (LIDODERM) 5 %, Place 1 patch onto the skin every 12 (twelve) hours. Remove & Discard patch within 12 hours or as directed by MD (Patient not taking: Reported on 10/04/2023), Disp: 10 patch, Rfl: 0   Lidocaine, Anorectal, 5 % CREA, Apply 1 application topically as needed. (Patient not taking: Reported on 09/05/2023), Disp: , Rfl:    metaxalone (SKELAXIN) 800 MG tablet, Take 1 tablet by mouth 3 (three) times daily. (Patient not taking: Reported on 10/04/2023), Disp: , Rfl:    Multiple Vitamins-Minerals (DIALYVITE 800/ULTRA D) TABS, Take 1 tablet by mouth daily. (Patient not taking: Reported on 09/05/2023), Disp: , Rfl:    ondansetron (ZOFRAN-ODT) 4 MG disintegrating tablet, Take 1 tablet (4 mg total) by mouth every 8 (eight) hours as needed. (Patient not taking: Reported on 09/05/2023), Disp: 20 tablet, Rfl: 0   oxyCODONE (ROXICODONE) 5 MG immediate release tablet, Take 1 tablet (5 mg total) by mouth every 8 (eight) hours as needed. (Patient not taking: Reported on 10/04/2023), Disp: 20 tablet, Rfl: 0   sevelamer carbonate (RENVELA)  800 MG tablet, Take 1,600 mg by mouth 3 (three) times daily with meals. (Patient not taking: Reported on 12/25/2022), Disp: , Rfl:    tacrolimus (PROGRAF) 1 MG capsule, Take 2 mg by mouth 2 (two) times daily. (Patient not taking: Reported on 09/05/2023), Disp: , Rfl:    torsemide (DEMADEX) 20 MG tablet, Take 40 mg by mouth 2 (two) times daily. (Patient not taking: Reported on 09/05/2023), Disp: , Rfl:    urea (CARMOL) 20 % cream, Apply 1 application topically daily as needed (for dry skin). (Patient not taking: Reported on 09/05/2023), Disp: , Rfl:   Imaging Review  MR LUMBAR SPINE WO CONTRAST  Narrative CLINICAL DATA:  Provided history: Low back pain, cauda equina syndrome suspected. Left lower back pain, exclude disc/cauda  equina/fracture. Additional history provided: Left-sided low back pain radiating down left leg.  EXAM: MRI LUMBAR SPINE WITHOUT CONTRAST  TECHNIQUE: Multiplanar, multisequence MR imaging of the lumbar spine was performed. No intravenous contrast was administered.  COMPARISON:  CT abdomen/pelvis 12/25/2022.  FINDINGS: Segmentation: 5 lumbar vertebrae. The caudal most well-formed intervertebral disc space is designated L5-S1.  Alignment:  No significant spondylolisthesis.  Vertebrae: No lumbar vertebral compression fracture. Mild degenerative endplate edema at N6-E9.  Conus medullaris and cauda equina: Conus extends to the L1-L2 level. No signal abnormality identified within the visualized distal spinal cord.  Paraspinal and other soft tissues: No acute finding within included portions of the abdomen/retroperitoneum. Atrophy of the native kidneys. Partially imaged left lower quadrant renal transplant. No paraspinal mass or collection.  Disc levels:  Multilevel disc degeneration, greatest at L3-L4 (moderate) and L4-L5 (mild-to-moderate).  T12-L1: No significant disc herniation or stenosis.  L1-L2: No significant disc herniation or stenosis.  L2-L3:  Slight disc bulge. Superimposed small right foraminal disc protrusion (at site of posterior annular fissure). Small left foraminal disc protrusion. Mild facet arthropathy (predominantly on the right). No significant spinal canal or foraminal stenosis.  L3-L4: Disc bulge. Superimposed left subarticular disc extrusion with caudal migration to the level of the upper/mid L4 vertebral body (for instance as seen on facet arthropathy (for instance as seen on series 5, image 9). Facet arthropathy. Small bilateral facet joint effusions. Ligamentum flavum hypertrophy. Severe central canal stenosis with likely impingement of the descending cauda equina nerve roots. Additionally, the caudally migrated left subarticular disc extrusion contributes to severe left subarticular stenosis, encroaching upon descending left-sided nerve roots (most notably the descending left L4 nerve root). Right subarticular stenosis also present. Moderate left neural foraminal narrowing.  L4-L5: Disc bulge. Superimposed broad-based right subarticular/foraminal disc protrusion (at site of posterior annular fissure). A central posterior annular fissure is also present. Facet arthropathy. Small bilateral facet joint effusions. Ligamentum flavum hypertrophy. Right greater than left subarticular stenosis with potential to affect either descending L5 nerve root. Moderate central canal stenosis. Bilateral neural foraminal narrowing (mild/moderate right, moderate left).  L5-S1: No significant disc herniation, degenerative spinal canal stenosis or neural foraminal narrowing. Epidural lipomatosis progressively narrows the thecal sac.  Small posteriorly projecting synovial facet cyst at multiple levels within the mid and lower thoracic spine.  IMPRESSION: 1. Lumbar spondylosis as outlined within the body of the report and most notably as follows. 2. At L3-L4, moderate disc degeneration with mild degenerative endplate edema.  Multifactorial severe central canal and left subarticular stenosis (with likely cauda equina nerve root impingement). Right subarticular stenosis and moderate left neural foraminal narrowing also present at this level. 3. At L4-L5, multifactorial right greater than left subarticular stenosis (with potential to affect either descending L5 nerve root. Moderate central canal stenosis. Bilateral neural foraminal narrowing (mild/moderate right, moderate left).   Electronically Signed By: Jackey Loge D.O. On: 09/04/2023 16:16    CT Lumbar Spine Wo Contrast  Narrative CLINICAL DATA:  Lumbar radiculopathy.  Infection suspected.  EXAM: CT LUMBAR SPINE WITHOUT CONTRAST  TECHNIQUE: Multidetector CT imaging of the lumbar spine was performed without intravenous contrast administration. Multiplanar CT image reconstructions were also generated.  RADIATION DOSE REDUCTION: This exam was performed according to the departmental dose-optimization program which includes automated exposure control, adjustment of the mA and/or kV according to patient size and/or use of iterative reconstruction technique.  COMPARISON:  CT examination dated Dec 06, 2020  FINDINGS: Segmentation: 5 lumbar type vertebrae.  Alignment: Normal.  Vertebrae: No acute fracture or focal pathologic process.  Paraspinal and other soft tissues: Aortic atherosclerotic calcifications.  Disc levels:  T12-L1: No significant disc bulge, spinal canal or neural foraminal stenosis.  L1-L2: No significant disc bulge, spinal canal or neural foraminal stenosis.  L2-L3: Mild disc bulge without significant spinal canal or neural foraminal stenosis.  L3-L4: Disc height loss and Schmorl nodes in the inferior endplate of the L3 vertebral body. Disc bulge with mild left and moderate right lateral recess stenosis. Mild left neural foraminal stenosis.  L4-L5: Disc height loss and disc bulge with moderate lateral  recess stenosis bilaterally. Mild bilateral neural foraminal stenosis.  L5-S1: Disc bulge without significant spinal canal or neural foraminal stenosis.  IMPRESSION: 1. No acute fracture or traumatic listhesis. 2. Multilevel degenerative changes of the lumbar spine as described above, with moderate right lateral recess stenosis at L3-L4 and moderate lateral recess stenosis bilaterally at L4-L5. Mild left neural foraminal stenosis at L3-L4 and mild bilateral neural foraminal stenosis at L4-L5. 3. Aortic atherosclerosis.   Electronically Signed By: Larose Hires D.O. On: 11/26/2022 10:33   DG Lumbar Spine Complete  Narrative CLINICAL DATA:  Evaluate for mobile spondylolisthesis. Intervertebral lumbar disc disorder with myelopathy, lumbar region. Left lower back pain radiating to left leg.  EXAM: LUMBAR SPINE - COMPLETE 4+ VIEW  COMPARISON:  MRI lumbar spine 09/04/2023  FINDINGS: There are 5 non-rib-bearing lumbar-type vertebral bodies. Mild dextrocurvature centered at L3-4. Severe left L3-4 disc space narrowing with moderate left endplate osteophytosis on frontal view. Moderate anterior L3-4 disc space narrowing and endplate osteophytosis on lateral view.  Mild left L4-5 disc space narrowing and endplate osteophytosis.  Moderate L5-S1 facet joint sclerosis and hypertrophy.  No sagittal spondylolisthesis on neutral, flexion, or extension view.  Vertebral body heights are maintained. Moderate bilateral inferior sacroiliac subchondral sclerosis.  Moderate to high-grade atherosclerotic calcifications. Surgical clips overlie the left hemipelvis.  IMPRESSION: 1. Mild dextrocurvature centered at L3-4. 2. Severe left and moderate anterior L3-4 degenerative disc and endplate changes. Note is made of a left paracentral posterior disc protrusion with caudad extension, impinging on the descending left L4 nerve at and inferior to the L3-4 disc level on recent  09/04/2023 MRI. 3. Mild left L4-5 degenerative disc and endplate changes. 4. Moderate L5-S1 facet joint degenerative changes.   Electronically Signed By: Neita Garnet M.D. On: 09/20/2023 11:33  DG HIP UNILAT WITH PELVIS 2-3 VIEWS LEFT  Narrative CLINICAL DATA:  Larey Seat on car per 10 days ago. Posterior left hip pain extending down to left knee.  EXAM: DG HIP (WITH OR WITHOUT PELVIS) 2-3V LEFT  COMPARISON:  Pelvis and left hip radiographs 11/26/2022  FINDINGS: There is diffuse decreased bone mineralization. Mild-to-moderate inferior subchondral sclerosis within the bilateral sacroiliac joints with mild joint space narrowing. Mild bilateral superomedial femoroacetabular joint space narrowing. Minimal superior pubic symphysis joint space narrowing. Normal morphology of the femoral head-neck junction without CAM-type bump deformity.  No acute fracture or dislocation. Moderate to high-grade atherosclerotic calcifications.  IMPRESSION: 1. 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 2. No acute fracture.   Electronically Signed By: Neita Garnet M.D. On: 09/20/2023 11:36   DG Ankle Complete Right  Narrative CLINICAL DATA:  Ankle pain/swelling  EXAM: RIGHT ANKLE - COMPLETE 3+ VIEW  COMPARISON:  None.  FINDINGS: Nondisplaced oblique fracture of the distal fibula.  The ankle mortise is intact.  The base of the fifth metatarsal is unremarkable.  Mild lateral soft tissue swelling.  IMPRESSION: Nondisplaced oblique fracture of  the distal fibula.   Electronically Signed By: Charline Bills M.D. On: 07/20/2020 13:20    Complexity Note: Imaging results reviewed.                         ROS  Cardiovascular: Daily Aspirin intake, High blood pressure, Heart failure, and Heart murmur Pulmonary or Respiratory: Shortness of breath, Smoking, and Snoring  Neurological: Abnormal skin sensations (Peripheral Neuropathy) Psychological-Psychiatric:  No reported psychological or psychiatric signs or symptoms such as difficulty sleeping, anxiety, depression, delusions or hallucinations (schizophrenial), mood swings (bipolar disorders) or suicidal ideations or attempts Gastrointestinal: No reported gastrointestinal signs or symptoms such as vomiting or evacuating blood, reflux, heartburn, alternating episodes of diarrhea and constipation, inflamed or scarred liver, or pancreas or irrregular and/or infrequent bowel movements Genitourinary: Difficulty producing urine (Renal failure), Passing kidney stones, and Recurrent Urinary Tract infections Hematological: Weakness due to low blood hemoglobin or red blood cell count (Anemia) Endocrine: High blood sugar requiring insulin (IDDM) Rheumatologic: No reported rheumatological signs and symptoms such as fatigue, joint pain, tenderness, swelling, redness, heat, stiffness, decreased range of motion, with or without associated rash Musculoskeletal: Negative for myasthenia gravis, muscular dystrophy, multiple sclerosis or malignant hyperthermia Work History: Retired  Allergies  Ms. Nieves is allergic to oxycodone, gabapentin, and valacyclovir.  Laboratory Chemistry Profile   Renal Lab Results  Component Value Date   BUN 56 (H) 12/25/2022   CREATININE 1.26 (H) 12/25/2022   LABCREA 22 05/25/2015   GFRAA 10 (L) 04/10/2019   GFRNONAA 45 (L) 12/25/2022   PROTEINUR NEGATIVE 12/25/2022     Electrolytes Lab Results  Component Value Date   NA 139 12/25/2022   K 4.2 12/25/2022   CL 110 12/25/2022   CALCIUM 8.5 (L) 12/25/2022   MG 2.3 07/23/2022   PHOS 5.3 (H) 04/09/2018     Hepatic Lab Results  Component Value Date   AST 15 12/25/2022   ALT 10 12/25/2022   ALBUMIN 3.9 12/25/2022   ALKPHOS 45 12/25/2022   LIPASE 37 12/25/2022     ID Lab Results  Component Value Date   HIV Non Reactive 04/06/2018   SARSCOV2NAA NEGATIVE 12/25/2022   STAPHAUREUS NEGATIVE 02/06/2017   MRSAPCR NEGATIVE  04/05/2018     Bone No results found for: "VD25OH", "VD125OH2TOT", "ZO1096EA5", "WU9811BJ4", "25OHVITD1", "25OHVITD2", "25OHVITD3", "TESTOFREE", "TESTOSTERONE"   Endocrine Lab Results  Component Value Date   GLUCOSE 170 (H) 12/25/2022   GLUCOSEU NEGATIVE 12/25/2022   HGBA1C 5.9 (H) 07/23/2022     Neuropathy Lab Results  Component Value Date   VITAMINB12 >7,500 (H) 02/10/2017   FOLATE 31.0 02/10/2017   HGBA1C 5.9 (H) 07/23/2022   HIV Non Reactive 04/06/2018     CNS No results found for: "COLORCSF", "APPEARCSF", "RBCCOUNTCSF", "WBCCSF", "POLYSCSF", "LYMPHSCSF", "EOSCSF", "PROTEINCSF", "GLUCCSF", "JCVIRUS", "CSFOLI", "IGGCSF", "LABACHR", "ACETBL"   Inflammation (CRP: Acute  ESR: Chronic) Lab Results  Component Value Date   ESRSEDRATE 21 11/26/2022   LATICACIDVEN 1.1 12/25/2022     Rheumatology No results found for: "RF", "ANA", "LABURIC", "URICUR", "LYMEIGGIGMAB", "LYMEABIGMQN", "HLAB27"   Coagulation Lab Results  Component Value Date   INR 1.15 04/07/2018   LABPROT 14.6 04/07/2018   APTT 33 02/06/2017   PLT 66 (L) 12/25/2022     Cardiovascular Lab Results  Component Value Date   BNP 2,217.1 (H) 07/23/2022   TROPONINI <0.03 02/14/2017   HGB 8.8 (L) 12/25/2022   HCT 27.8 (L) 12/25/2022     Screening Lab Results  Component Value  Date   SARSCOV2NAA NEGATIVE 12/25/2022   STAPHAUREUS NEGATIVE 02/06/2017   MRSAPCR NEGATIVE 04/05/2018   HIV Non Reactive 04/06/2018     Cancer No results found for: "CEA", "CA125", "LABCA2"   Allergens No results found for: "ALMOND", "APPLE", "ASPARAGUS", "AVOCADO", "BANANA", "BARLEY", "BASIL", "BAYLEAF", "GREENBEAN", "LIMABEAN", "WHITEBEAN", "BEEFIGE", "REDBEET", "BLUEBERRY", "BROCCOLI", "CABBAGE", "MELON", "CARROT", "CASEIN", "CASHEWNUT", "CAULIFLOWER", "CELERY"     Note: Lab results reviewed.  PFSH  Drug: Ms. Easterday  reports no history of drug use. Alcohol:  reports no history of alcohol use. Tobacco:  reports that she quit  smoking about 14 years ago. Her smoking use included cigarettes. She has never used smokeless tobacco. Medical:  has a past medical history of Anemia, CHF (congestive heart failure) (HCC), Chronic kidney disease, Complication of anesthesia, Coronary artery disease, Diabetes mellitus without complication (HCC), Dialysis patient [facility]), Dyspnea, Edema, History of recent blood transfusion (01/2017), Hypercholesterolemia, Hypertension, Hypothyroidism, Neuropathy, PONV (postoperative nausea and vomiting), Shingles, Steal syndrome as complication of dialysis access [facility]), and Tremors of nervous system. Family: family history includes Diabetes in her mother.  Past Surgical History:  Procedure Laterality Date   A/V FISTULAGRAM Left 01/16/2017   Procedure: A/V Fistulagram;  Surgeon: Renford Dills, MD;  Location: [facility] INVASIVE CV LAB;  Service: Cardiovascular;  Laterality: Left;   A/V SHUNT INTERVENTION N/A 01/16/2017   Procedure: A/V Shunt Intervention;  Surgeon: Renford Dills, MD;  Location: [facility];  Service: Cardiovascular;  Laterality: N/A;   ABDOMINAL HYSTERECTOMY     AV FISTULA PLACEMENT Left 11/30/2016   Procedure: ARTERIOVENOUS (AV) FISTULA CREATION ( EXPLORE FOR CREATION BRACHIOCEPHALIC);  Surgeon: Annice Needy, MD;  Location: [facility];  Service: Vascular;  Laterality: Left;   CAPD INSERTION N/A 11/25/2015   Procedure: LAPAROSCOPIC INSERTION CONTINUOUS AMBULATORY PERITONEAL DIALYSIS  (CAPD) CATHETER;  Surgeon: Annice Needy, MD;  Location: [facility];  Service: Vascular;  Laterality: N/A;   CAPD INSERTION N/A 01/06/2016   Procedure: LAPAROSCOPIC INSERTION CONTINUOUS AMBULATORY PERITONEAL DIALYSIS  (CAPD) CATHETER REVISION ;  Surgeon: Annice Needy, MD;  Location: [facility];  Service: Vascular;  Laterality: N/A;   CAPD INSERTION N/A 10/11/2016   Procedure: LAPAROSCOPIC INSERTION CONTINUOUS AMBULATORY PERITONEAL DIALYSIS  (CAPD) CATHETER ( REVISION );  Surgeon: Annice Needy, MD;  Location:  [facility];  Service: Vascular;  Laterality: N/A;   CARDIAC CATHETERIZATION     CATARACT EXTRACTION W/PHACO Right 05/25/2016   Procedure: CATARACT EXTRACTION PHACO AND INTRAOCULAR LENS PLACEMENT (IOC);  Surgeon: Nevada Crane, MD;  Location: [facility];  Service: Ophthalmology;  Laterality: Right;  Lot # [id number] [place]: 00:5.2 AP%: 11.3 CDE: 7.21   CATARACT EXTRACTION W/PHACO Left 12/06/2017   Procedure: CATARACT EXTRACTION PHACO AND INTRAOCULAR LENS PLACEMENT (IOC);  Surgeon: Nevada Crane, MD;  Location: [facility];  Service: Ophthalmology;  Laterality: Left;  [place] 00.23.4 AP% 7.9 CDE 1.83 Fluid Pack Lot # [id number]   COLONOSCOPY WITH PROPOFOL N/A 02/15/2017   Procedure: COLONOSCOPY WITH PROPOFOL;  Surgeon: Wyline Mood, MD;  Location: [facility];  Service: Gastroenterology;  Laterality: N/A;   CORONARY ANGIOPLASTY     STENT   CORONARY STENT INTERVENTION N/A 09/07/2016   Procedure: Coronary Stent Intervention;  Surgeon: Marcina Millard, MD;  Location: [facility];  Service: Cardiovascular;  Laterality: N/A;   DIALYSIS/PERMA CATHETER INSERTION N/A 09/21/2016   Procedure: Dialysis/Perma Catheter Insertion;  Surgeon: Annice Needy, MD;  Location: [facility];  Service: Cardiovascular;  Laterality: N/A;   DIALYSIS/PERMA CATHETER  REMOVAL Right 11/30/2016   Procedure: DIALYSIS/PERMA CATHETER REMOVAL;  Surgeon: Annice Needy, MD;  Location: [facility];  Service: Vascular;  Laterality: Right;   ESOPHAGOGASTRODUODENOSCOPY (EGD) WITH PROPOFOL N/A 02/15/2017   Procedure: ESOPHAGOGASTRODUODENOSCOPY (EGD) WITH PROPOFOL;  Surgeon: Wyline Mood, MD;  Location: [facility] ENDOSCOPY;  Service: Gastroenterology;  Laterality: N/A;   EYE SURGERY     GIVENS CAPSULE STUDY N/A 02/26/2017   Procedure: GIVENS CAPSULE STUDY;  Surgeon: Wyline Mood, MD;  Location: [facility] ENDOSCOPY;  Service: Gastroenterology;  Laterality: N/A;   INSERTION OF DIALYSIS CATHETER N/A 04/07/2018   Procedure: INSERTION OF DIALYSIS  CATHETER;  Surgeon: Larina Earthly, MD;  Location: [facility];  Service: Vascular;  Laterality: N/A;   LEFT HEART CATH AND CORONARY ANGIOGRAPHY Left 09/07/2016   Procedure: Left Heart Cath and Coronary Angiography;  Surgeon: Marcina Millard, MD;  Location: [facility];  Service: Cardiovascular;  Laterality: Left;   LIGATION OF ARTERIOVENOUS  FISTULA Left 02/07/2017   Procedure: LIGATION OF ARTERIOVENOUS  FISTULA ( BANDING BRACHIAL CEPHALIC );  Surgeon: Renford Dills, MD;  Location: [facility];  Service: Vascular;  Laterality: Left;   TUBAL LIGATION     UPPER EXTREMITY ANGIOGRAPHY Left 01/19/2017   Procedure: Upper Extremity Angiography;  Surgeon: Renford Dills, MD;  Location: [facility];  Service: Cardiovascular;  Laterality: Left;   Active Ambulatory Problems    Diagnosis Date Noted   Proteinuria 05/25/2015   Anemia in chronic renal disease 10/04/2015   Myoclonic jerking 12/19/2015   ESRD on dialysis (HCC) 12/21/2015   Peritoneal dialysis status (HCC) 12/21/2015   Leg swelling 12/21/2015   Anemia of chronic disease 12/21/2015   Diabetes mellitus (HCC) 12/21/2015   Hyperkalemia 12/21/2015   Acute pulmonary edema (HCC) 07/16/2016   CAD (coronary artery disease) 09/07/2016   Acute respiratory failure (HCC) 09/21/2016   Acute respiratory distress    GI bleeding 01/29/2017   Essential hypertension 02/02/2017   GIB (gastrointestinal bleeding) 02/10/2017   Acute blood loss anemia    Acute combined systolic and diastolic congestive heart failure (HCC) 08/17/2016   CKD (chronic kidney disease) stage 5, GFR less than 15 ml/min (HCC) 07/09/2015   DM type 2 with diabetic peripheral neuropathy (HCC) 09/13/2015   Elevated triglycerides with high cholesterol 02/21/2017   Hypothyroidism 06/08/2016   Mixed hyperlipidemia 09/13/2015   Peripheral neuropathy 02/21/2017   S/P drug eluting coronary stent placement 09/14/2016   Subclavian arterial stenosis (HCC) 01/29/2017   Type  2 diabetes mellitus with diabetic nephropathy, with long-term current use of insulin (HCC) 09/13/2015   Complication from renal dialysis device 12/03/2017   Peritoneal dialysis catheter dysfunction (HCC) 04/04/2018   PD catheter dysfunction (HCC) 04/06/2018   Steal syndrome as complication of dialysis access [facility])    Abdominal distention 04/07/2018   Hyponatremia 04/07/2018   Dyspnea 04/07/2018   Acidosis 04/07/2018   Hypertensive urgency    Flash pulmonary edema (HCC) 07/23/2022   Acute hypoxemic respiratory failure (HCC) 07/23/2022   Pain of left hip 09/15/2023   Resolved Ambulatory Problems    Diagnosis Date Noted   Steal syndrome dialysis vascular access (HCC) 02/02/2017   Past Medical History:  Diagnosis Date   Anemia    CHF (congestive heart failure) (HCC)    Chronic kidney disease    Complication of anesthesia    Coronary artery disease    Diabetes mellitus without complication (HCC)    Dialysis patient (HCC)    Edema    History of recent blood  transfusion 01/2017   Hypercholesterolemia    Hypertension    Neuropathy    PONV (postoperative nausea and vomiting)    Shingles    Tremors of nervous system    Constitutional Exam  General appearance: Well nourished, well developed, and well hydrated. In no apparent acute distress Vitals:   10/04/23 1004  BP: 126/64  Pulse: 70  Temp: 97.9 F (36.6 C)  SpO2: 100%  Weight: 127 lb (57.6 kg)  Height: 5' (1.524 m)   BMI Assessment: Estimated body mass index is 24.8 kg/m as calculated from the following:   Height as of this encounter: 5' (1.524 m).   Weight as of this encounter: 127 lb (57.6 kg).  BMI interpretation table: BMI level Category Range association with higher incidence of chronic pain  <18 kg/m2 Underweight   18.5-24.9 kg/m2 Ideal body weight   25-29.9 kg/m2 Overweight Increased incidence by 20%  30-34.9 kg/m2 Obese (Class I) Increased incidence by 68%  35-39.9 kg/m2 Severe obesity (Class II) Increased  incidence by 136%  >40 kg/m2 Extreme obesity (Class III) Increased incidence by 254%   Patient's current BMI Ideal Body weight  Body mass index is 24.8 kg/m. Ideal body weight: 45.5 kg (100 lb 4.9 oz) Adjusted ideal body weight: 50.3 kg (110 lb 15.8 oz)   BMI Readings from Last 4 Encounters:  10/04/23 24.80 kg/m  09/13/23 24.97 kg/m  09/06/23 24.97 kg/m  09/04/23 25.39 kg/m   Wt Readings from Last 4 Encounters:  10/04/23 127 lb (57.6 kg)  09/13/23 127 lb 13.9 oz (58 kg)  09/06/23 127 lb 13.9 oz (58 kg)  09/04/23 130 lb (59 kg)    Psych/Mental status: Alert, oriented x 3 (person, place, & time)       Eyes: PERLA Respiratory: No evidence of acute respiratory distress  Thoracic Spine Area Exam  Skin & Axial Inspection: No masses, redness, or swelling Alignment: Symmetrical Functional ROM: Unrestricted ROM Stability: No instability detected Muscle Tone/Strength: Functionally intact. No obvious neuro-muscular anomalies detected. Sensory (Neurological): Unimpaired Muscle strength & Tone: No palpable anomalies Lumbar Spine Area Exam  Skin & Axial Inspection: Thoraco-lumbar Scoliosis Alignment: Symmetrical Functional ROM: Pain restricted ROM       Stability: No instability detected Muscle Tone/Strength: Functionally intact. No obvious neuro-muscular anomalies detected. Sensory (Neurological): Unimpaired  Gait & Posture Assessment  Ambulation: Limited Gait: Antalgic gait (limping) Posture: WNL  Lower Extremity Exam    Side: Right lower extremity  Side: Left lower extremity  Stability: No instability observed          Stability: No instability observed          Skin & Extremity Inspection: Skin color, temperature, and hair growth are WNL. No peripheral edema or cyanosis. No masses, redness, swelling, asymmetry, or associated skin lesions. No contractures.  Skin & Extremity Inspection: Skin color, temperature, and hair growth are WNL. No peripheral edema or cyanosis. No  masses, redness, swelling, asymmetry, or associated skin lesions. No contractures.  Functional ROM: Unrestricted ROM                  Functional ROM: Unrestricted ROM                  Muscle Tone/Strength: Functionally intact. No obvious neuro-muscular anomalies detected.  Muscle Tone/Strength: Functionally intact. No obvious neuro-muscular anomalies detected.  Sensory (Neurological): Unimpaired        Sensory (Neurological): Unimpaired        DTR: Patellar: deferred today Achilles: deferred today Plantar:  deferred today  DTR: Patellar: deferred today Achilles: deferred today Plantar: deferred today  Palpation: No palpable anomalies  Palpation: No palpable anomalies    Assessment  Primary Diagnosis & Pertinent Problem List: The primary encounter diagnosis was Foraminal stenosis of lumbar region. Diagnoses of Chronic radicular lumbar pain and Spinal stenosis, lumbar region, with neurogenic claudication were also pertinent to this visit.  Visit Diagnosis (New problems to examiner): 1. Foraminal stenosis of lumbar region   2. Chronic radicular lumbar pain   3. Spinal stenosis, lumbar region, with neurogenic claudication    Plan of Care (Initial workup plan)  Patient had a severe lumbar radicular pain flare last month that prompted her to visit the emergency department on 3 occasions in 1 week.  Fortunately, this pain has subsided and she states that she is not in severe pain any longer although she does have some gait abnormality and issues with mobility.  MRI that was done at the hospital showed multifactorial severe canal stenosis and left subarticular stenosis with likely cauda equina nerve root impingement.  There was moderate left neuroforaminal narrowing present as well at L3-L4.  At L4-L5 there is right greater than left subarticular stenosis affecting the descending L5 nerve roots.  There is also moderate canal stenosis along with moderate right and moderate left bilateral  neuroforaminal narrowing.  Given that she is not symptomatic at this time I do not recommend an epidural steroid injection.  Continue to monitor symptoms however should her pain return I have suggested that she follow-up for a lumbar transforaminal epidural steroid injection.  This was discussed with her and her husband in detail.  She is pleased with how she is doing today although what transpired last month was very scary for her.  I reviewed her lumbar MRI with her in detail she is in agreement with treatment plan.    Procedure Orders         Lumbar Transforaminal Epidural      Provider-requested follow-up: Return for L4 & L5 TF ESI PRN.  No future appointments.  Duration of encounter: .  Total time on encounter, as per AMA guidelines included both the face-to-face and non-face-to-face time personally spent by the physician and/or other qualified health care professional(s) on the day of the encounter (includes time in activities that require the physician or other qualified health care professional and does not include time in activities normally performed by clinical staff). Physician's time may include the following activities when performed: Preparing to see the patient (e.g., pre-charting review of records, searching for previously ordered imaging, lab work, and nerve conduction tests) Review of prior analgesic pharmacotherapies. Reviewing PMP Interpreting ordered tests (e.g., lab work, imaging, nerve conduction tests) Performing post-procedure evaluations, including interpretation of diagnostic procedures Obtaining and/or reviewing separately obtained history Performing a medically appropriate examination and/or evaluation Counseling and educating the patient/family/caregiver Ordering medications, tests, or procedures Referring and communicating with other health care professionals (when not separately reported) Documenting clinical information in the electronic or other  health record Independently interpreting results (not separately reported) and communicating results to the patient/ family/caregiver Care coordination (not separately reported)  Note by: Edward Jolly, MD (AI and TTS technology used. I apologize for any typographical errors that were not detected and corrected.) Date: 10/04/2023; Time: 11:00 AM

## 2023-10-08 DIAGNOSIS — Z79899 Other long term (current) drug therapy: Principal | ICD-10-CM

## 2023-10-08 DIAGNOSIS — N200 Calculus of kidney: Principal | ICD-10-CM

## 2023-10-08 DIAGNOSIS — Z940 Kidney transplant status: Principal | ICD-10-CM

## 2023-10-08 MED ORDER — TAMSULOSIN 0.4 MG CAPSULE
ORAL_CAPSULE | Freq: Every evening | ORAL | 0 refills | 30.00 days
Start: 2023-10-08 — End: ?

## 2023-10-09 MED ORDER — DEXCOM G7 SENSOR DEVICE
0 refills | 0.00 days
Start: 2023-10-09 — End: ?

## 2023-10-10 ENCOUNTER — Ambulatory Visit: Admit: 2023-10-10 | Discharge: 2023-10-11 | Payer: MEDICARE

## 2023-10-10 DIAGNOSIS — Z79899 Other long term (current) drug therapy: Principal | ICD-10-CM

## 2023-10-10 DIAGNOSIS — Z940 Kidney transplant status: Principal | ICD-10-CM

## 2023-10-10 MED ORDER — DEXCOM G7 SENSOR DEVICE
11 refills | 30.00 days | Status: CP
Start: 2023-10-10 — End: ?

## 2023-10-11 MED ORDER — DEXCOM G7 SENSOR DEVICE
0 refills | 30.00 days | Status: CP
Start: 2023-10-11 — End: ?

## 2023-10-15 ENCOUNTER — Ambulatory Visit: Admit: 2023-10-15 | Discharge: 2023-10-15 | Payer: MEDICARE

## 2023-10-15 ENCOUNTER — Encounter: Admit: 2023-10-15 | Discharge: 2023-10-15

## 2023-10-23 ENCOUNTER — Inpatient Hospital Stay: Admit: 2023-10-23 | Discharge: 2023-10-24

## 2023-10-24 DIAGNOSIS — Z940 Kidney transplant status: Principal | ICD-10-CM

## 2023-11-01 ENCOUNTER — Ambulatory Visit: Admit: 2023-11-01 | Discharge: 2023-11-02

## 2023-11-01 DIAGNOSIS — Z940 Kidney transplant status: Principal | ICD-10-CM

## 2023-11-01 DIAGNOSIS — N390 Urinary tract infection, site not specified: Principal | ICD-10-CM

## 2023-11-01 MED ORDER — METHENAMINE HIPPURATE 1 GRAM TABLET
ORAL_TABLET | Freq: Two times a day (BID) | ORAL | 11 refills | 30.00 days | Status: CP
Start: 2023-11-01 — End: 2024-10-31

## 2023-11-02 MED ORDER — PEN NEEDLE, DIABETIC 32 GAUGE X 5/32" (4 MM)
0 refills | 0.00 days
Start: 2023-11-02 — End: ?

## 2023-11-04 MED ORDER — PEN NEEDLE, DIABETIC 32 GAUGE X 5/32" (4 MM)
0 refills | 0.00 days | Status: CP
Start: 2023-11-04 — End: ?

## 2023-11-05 DIAGNOSIS — Z940 Kidney transplant status: Principal | ICD-10-CM

## 2023-11-12 ENCOUNTER — Encounter: Admit: 2023-11-12 | Discharge: 2023-11-13 | Payer: Medicare (Managed Care)

## 2023-11-16 ENCOUNTER — Emergency Department
Admission: EM | Admit: 2023-11-16 | Discharge: 2023-11-16 | Disposition: A | Discharge status: Home / Self Care | Attending: Emergency Medicine | Admitting: Emergency Medicine

## 2023-11-16 ENCOUNTER — Emergency Department

## 2023-11-16 ENCOUNTER — Other Ambulatory Visit: Payer: Self-pay

## 2023-11-16 DIAGNOSIS — I509 Heart failure, unspecified: Secondary | ICD-10-CM | POA: Insufficient documentation

## 2023-11-16 DIAGNOSIS — I110 Hypertensive heart disease with heart failure: Secondary | ICD-10-CM | POA: Insufficient documentation

## 2023-11-16 DIAGNOSIS — R519 Headache, unspecified: Secondary | ICD-10-CM | POA: Insufficient documentation

## 2023-11-16 DIAGNOSIS — E119 Type 2 diabetes mellitus without complications: Secondary | ICD-10-CM | POA: Insufficient documentation

## 2023-11-16 DIAGNOSIS — I2510 Atherosclerotic heart disease of native coronary artery without angina pectoris: Secondary | ICD-10-CM | POA: Diagnosis not present

## 2023-11-16 LAB — CBC
HCT: 31.1 % — ABNORMAL LOW (ref 36.0–46.0)
Hemoglobin: 10.9 g/dL — ABNORMAL LOW (ref 12.0–15.0)
MCH: 32.3 pg (ref 26.0–34.0)
MCHC: 35 g/dL (ref 30.0–36.0)
MCV: 92.3 fL (ref 80.0–100.0)
Platelets: 116 10*3/uL — ABNORMAL LOW (ref 150–400)
RBC: 3.37 MIL/uL — ABNORMAL LOW (ref 3.87–5.11)
RDW: 13 % (ref 11.5–15.5)
WBC: 6.8 10*3/uL (ref 4.0–10.5)
nRBC: 0 % (ref 0.0–0.2)

## 2023-11-16 LAB — BASIC METABOLIC PANEL WITH GFR
Anion gap: 8 (ref 5–15)
BUN: 44 mg/dL — ABNORMAL HIGH (ref 8–23)
CO2: 18 mmol/L — ABNORMAL LOW (ref 22–32)
Calcium: 8.6 mg/dL — ABNORMAL LOW (ref 8.9–10.3)
Chloride: 105 mmol/L (ref 98–111)
Creatinine, Ser: 1.56 mg/dL — ABNORMAL HIGH (ref 0.44–1.00)
GFR, Estimated: 35 mL/min — ABNORMAL LOW (ref >60)
Glucose, Bld: 132 mg/dL — ABNORMAL HIGH (ref 70–99)
Potassium: 3.8 mmol/L (ref 3.5–5.1)
Sodium: 131 mmol/L — ABNORMAL LOW (ref 135–145)

## 2023-11-16 MED ORDER — PROCHLORPERAZINE MALEATE 5 MG PO TABS: 5.0000 mg | ORAL_TABLET | Freq: Four times a day (QID) | ORAL | 0 refills | Status: AC | PRN

## 2023-11-16 MED ORDER — OXYCODONE HCL 5 MG PO TABS
5.0000 mg | ORAL_TABLET | Freq: Once | ORAL | Status: AC
  Administered 2023-11-16: 5 mg via ORAL
  Filled 2023-11-16: qty 1

## 2023-11-16 MED ORDER — ONDANSETRON 4 MG PO TBDP
4.0000 mg | ORAL_TABLET | Freq: Once | ORAL | Status: AC
  Administered 2023-11-16: 4 mg via ORAL
  Filled 2023-11-16: qty 1

## 2023-11-16 NOTE — ED Notes (Signed)
 Pt requesting to order meal tray however her work up is not complete.  Pt advised that ED doc would return with results and she can request a diet order at that time.

## 2023-11-16 NOTE — ED Provider Notes (Signed)
 [facility] Provider Note    Event Date/Time   First MD Initiated Contact with Patient 11/16/23 [phone]     (approximate)   History   Chief Complaint Headache   HPI  Bethany Lee is a 73 y.o. female with past medical history of hypertension, hyperlipidemia, diabetes, CAD, CHF, and renal transplant who presents to the ED complaining of headache.  Patient reports that she developed gradually worsening headache above her right ear last night.  She describes it as a sharp and stabbing pain that is not exacerbated or alleviated by thing particular.  She denies any pain inside the ear itself, has not had any fevers or neck stiffness.  She denies any vision changes, speech changes, numbness, or weakness.  She did take a dose of Tylenol  with partial relief this morning, but denies any history of similar headaches.     Physical Exam   Triage Vital Signs: ED Triage Vitals  Encounter Vitals Group     BP 11/16/23 0643 (!) 114/45     Systolic BP Percentile --      Diastolic BP Percentile --      Pulse Rate 11/16/23 0643 67     Resp 11/16/23 0643 18     Temp 11/16/23 0643 98.4 F (36.9 C)     Temp Source 11/16/23 0643 Oral     SpO2 11/16/23 0643 96 %     Weight 11/16/23 0642 130 lb (59 kg)     Height --      Head Circumference --      Peak Flow --      Pain Score 11/16/23 0641 10     Pain Loc --      Pain Education --      Exclude from Growth Chart --     Most recent vital signs: Vitals:   11/16/23 0643  BP: (!) 114/45  Pulse: 67  Resp: 18  Temp: 98.4 F (36.9 C)  SpO2: 96%    Constitutional: Alert and oriented. Eyes: Conjunctivae are normal. Head: Atraumatic.  Tenderness along the right parietal scalp superior to the right ear.  No temporal tenderness bilaterally. Ears: TMs clear bilaterally without erythema, edema, or drainage.  No tenderness noted over the mastoid process. Nose: No congestion/rhinnorhea. Mouth/Throat: Mucous membranes are  moist.  Neck: Supple with no meningismus. Cardiovascular: Normal rate, regular rhythm. Grossly normal heart sounds.  2+ radial pulses bilaterally. Respiratory: Normal respiratory effort.  No retractions. Lungs CTAB. Gastrointestinal: Soft and nontender. No distention. Musculoskeletal: No lower extremity tenderness nor edema.  Neurologic:  Normal speech and language. No gross focal neurologic deficits are appreciated.    ED Results / Procedures / Treatments   Labs (all labs ordered are listed, but only abnormal results are displayed) Labs Reviewed  CBC - Abnormal; Notable for the following components:      Result Value   RBC 3.37 (*)    Hemoglobin 10.9 (*)    HCT 31.1 (*)    Platelets 116 (*)    All other components within normal limits  BASIC METABOLIC PANEL WITH GFR - Abnormal; Notable for the following components:   Sodium 131 (*)    CO2 18 (*)    Glucose, Bld 132 (*)    BUN 44 (*)    Creatinine, Ser 1.56 (*)    Calcium  8.6 (*)    GFR, Estimated 35 (*)    All other components within normal limits    RADIOLOGY CT head reviewed  and interpreted by me with no hemorrhage or midline shift.  PROCEDURES:  Critical Care performed: No  Procedures   MEDICATIONS ORDERED IN ED: Medications  oxyCODONE  (Oxy IR/ROXICODONE ) immediate release tablet 5 mg (5 mg Oral Given 11/16/23 0744)  ondansetron  (ZOFRAN -ODT) disintegrating tablet 4 mg (4 mg Oral Given 11/16/23 0744)     IMPRESSION / MDM / ASSESSMENT AND PLAN / ED COURSE  I reviewed the triage vital signs and the nursing notes.                              73 y.o. female with past medical history of hypertension, hyperlipidemia, CAD, CHF, diabetes, and renal transplant who presents to the ED complaining of gradually worsening headache above her right ear since last night.  Patient's presentation is most consistent with acute presentation with potential threat to life or bodily function.  Differential diagnosis includes, but  is not limited to, SAH, meningitis, tension headache, migraine headache, anemia, electrolyte abnormality, AKI, otitis media, otitis externa, mastoiditis.  Patient nontoxic-appearing and in no acute distress, vital signs are unremarkable and she has a nonfocal neurologic exam.  Her pain is reproduced with palpation to the area of her scalp above her right ear, no temporal tenderness or tenderness over the mastoid process.  We will further assess with CT imaging, lab results pending at this time.  Plan to treat symptomatically with oxycodone  and reassess following imaging.  CT head is negative for acute process, labs show renal function stable compared to previous with no acute electrolyte abnormality, anemia, or leukocytosis.  Patient's symptoms are improved on reassessment and she is appropriate for discharge home with outpatient follow-up, was counseled to return to the ED for new or worsening symptoms.  Patient and spouse agree with plan.      FINAL CLINICAL IMPRESSION(S) / ED DIAGNOSES   Final diagnoses:  Acute nonintractable headache, unspecified headache type     Rx / DC Orders   ED Discharge Orders     None        Note:  This document was prepared using Dragon voice recognition software and may include unintentional dictation errors.   Twilla Galea, MD 11/16/23 [phone]

## 2023-11-16 NOTE — ED Triage Notes (Signed)
 Pt to ED via POV c/o headache that started last night. States it is behind her right ear, sharp in nature. Denies hx of headaches.

## 2023-11-19 ENCOUNTER — Ambulatory Visit: Admit: 2023-11-19 | Payer: Medicare (Managed Care)

## 2023-11-19 DIAGNOSIS — Z940 Kidney transplant status: Principal | ICD-10-CM

## 2023-11-21 ENCOUNTER — Inpatient Hospital Stay: Admit: 2023-11-21 | Discharge: 2023-11-22 | Payer: Medicare (Managed Care)

## 2023-11-23 ENCOUNTER — Ambulatory Visit: Admit: 2023-11-23 | Payer: Medicare (Managed Care)

## 2023-11-23 ENCOUNTER — Encounter: Admit: 2023-11-23 | Payer: Medicare (Managed Care)

## 2023-11-23 ENCOUNTER — Ambulatory Visit: Admit: 2023-11-23 | Discharge: 2023-11-30 | Payer: Medicare (Managed Care)

## 2023-11-23 ENCOUNTER — Inpatient Hospital Stay
Admit: 2023-11-23 | Discharge: 2023-11-30 | Disposition: A | Discharge status: Home Health | Payer: Medicare (Managed Care)

## 2023-11-23 ENCOUNTER — Ambulatory Visit
Admit: 2023-11-23 | Discharge: 2023-11-30 | Disposition: A | Discharge status: Home Health | Payer: Medicare (Managed Care)

## 2023-11-24 DIAGNOSIS — Z940 Kidney transplant status: Principal | ICD-10-CM

## 2023-11-28 MED ORDER — ATORVASTATIN 10 MG TABLET
ORAL_TABLET | Freq: Every day | ORAL | 0 refills | 90.00000 days | Status: CP
Start: 2023-11-28 — End: ?

## 2023-11-28 MED ORDER — AMLODIPINE 10 MG TABLET
ORAL_TABLET | Freq: Every day | ORAL | 0 refills | 90.00000 days | Status: CP
Start: 2023-11-28 — End: ?

## 2023-12-04 ENCOUNTER — Ambulatory Visit: Admit: 2023-12-04 | Discharge: 2023-12-05 | Payer: Medicare (Managed Care)

## 2023-12-05 ENCOUNTER — Encounter: Admit: 2023-12-05 | Discharge: 2023-12-05 | Payer: Medicare (Managed Care)

## 2023-12-05 DIAGNOSIS — Z940 Kidney transplant status: Principal | ICD-10-CM

## 2023-12-07 ENCOUNTER — Encounter: Admit: 2023-12-07 | Payer: Medicare (Managed Care)

## 2023-12-07 ENCOUNTER — Encounter: Admit: 2023-12-07 | Discharge: 2024-01-05 | Payer: Medicare (Managed Care)

## 2023-12-07 ENCOUNTER — Inpatient Hospital Stay: Admit: 2023-12-07 | Payer: Medicare (Managed Care)

## 2023-12-10 ENCOUNTER — Ambulatory Visit: Admit: 2023-12-10 | Payer: Medicare (Managed Care)

## 2023-12-19 ENCOUNTER — Ambulatory Visit: Admit: 2023-12-19 | Payer: Medicare (Managed Care) | Attending: Geriatric Medicine | Primary: Geriatric Medicine

## 2023-12-20 DIAGNOSIS — N12 Tubulo-interstitial nephritis, not specified as acute or chronic: Principal | ICD-10-CM

## 2023-12-23 DIAGNOSIS — Z940 Kidney transplant status: Principal | ICD-10-CM

## 2023-12-27 ENCOUNTER — Ambulatory Visit: Admit: 2023-12-27 | Discharge: 2023-12-28 | Payer: Medicare (Managed Care)

## 2023-12-27 DIAGNOSIS — Z940 Kidney transplant status: Principal | ICD-10-CM

## 2023-12-27 DIAGNOSIS — N390 Urinary tract infection, site not specified: Principal | ICD-10-CM

## 2023-12-27 MED ORDER — FOSFOMYCIN TROMETHAMINE 3 GRAM ORAL PACKET
ORAL | 0 refills | 56.00000 days | Status: CP
Start: 2023-12-27 — End: 2024-02-15

## 2023-12-28 DIAGNOSIS — Z79899 Other long term (current) drug therapy: Principal | ICD-10-CM

## 2023-12-28 DIAGNOSIS — Z940 Kidney transplant status: Principal | ICD-10-CM

## 2024-01-04 ENCOUNTER — Ambulatory Visit: Admit: 2024-01-04 | Discharge: 2024-01-05 | Payer: Medicare (Managed Care)

## 2024-01-04 DIAGNOSIS — Z79899 Other long term (current) drug therapy: Principal | ICD-10-CM

## 2024-01-04 DIAGNOSIS — Z940 Kidney transplant status: Principal | ICD-10-CM

## 2024-01-06 ENCOUNTER — Encounter: Admit: 2024-01-06 | Discharge: 2024-01-15 | Payer: Medicare (Managed Care)

## 2024-01-06 ENCOUNTER — Encounter: Admit: 2024-01-06 | Payer: Medicare (Managed Care)

## 2024-01-07 ENCOUNTER — Encounter: Admit: 2024-01-07 | Discharge: 2024-01-07 | Payer: Medicare (Managed Care)

## 2024-01-14 MED ORDER — AMLODIPINE 10 MG TABLET
ORAL_TABLET | Freq: Every day | ORAL | 0 refills | 90.00000 days | Status: CP
Start: 2024-01-14 — End: ?

## 2024-01-24 ENCOUNTER — Ambulatory Visit: Admit: 2024-01-24 | Payer: Medicare (Managed Care)

## 2024-01-25 MED ORDER — LEVOTHYROXINE 112 MCG TABLET
ORAL_TABLET | Freq: Every day | ORAL | 0 refills | 0.00000 days
Start: 2024-01-25 — End: ?

## 2024-01-29 MED ORDER — LEVOTHYROXINE 112 MCG TABLET
ORAL_TABLET | Freq: Every day | ORAL | 0 refills | 90.00000 days | Status: CP
Start: 2024-01-29 — End: ?

## 2024-01-31 ENCOUNTER — Ambulatory Visit: Admit: 2024-01-31 | Discharge: 2024-02-01 | Payer: Medicare (Managed Care)

## 2024-01-31 ENCOUNTER — Encounter: Admit: 2024-01-31 | Discharge: 2024-02-01 | Payer: Medicare (Managed Care)

## 2024-01-31 DIAGNOSIS — Z940 Kidney transplant status: Principal | ICD-10-CM

## 2024-01-31 DIAGNOSIS — Z796 Long term current use of immunosuppressive drug: Principal | ICD-10-CM

## 2024-02-04 ENCOUNTER — Encounter: Admit: 2024-02-04 | Discharge: 2024-02-04 | Payer: Medicare (Managed Care)

## 2024-02-15 ENCOUNTER — Ambulatory Visit: Admit: 2024-02-15 | Discharge: 2024-02-16 | Payer: Medicare (Managed Care)

## 2024-03-01 ENCOUNTER — Ambulatory Visit
Admit: 2024-03-01 | Discharge: 2024-03-07 | Disposition: A | Discharge status: Home / Self Care | Payer: Medicare (Managed Care) | Admitting: Internal Medicine

## 2024-03-01 ENCOUNTER — Ambulatory Visit: Admit: 2024-03-01 | Discharge: 2024-03-07 | Payer: Medicare (Managed Care)

## 2024-03-01 ENCOUNTER — Inpatient Hospital Stay
Admit: 2024-03-01 | Discharge: 2024-03-07 | Disposition: A | Discharge status: Home / Self Care | Payer: Medicare (Managed Care) | Admitting: Internal Medicine

## 2024-03-02 DIAGNOSIS — Z940 Kidney transplant status: Principal | ICD-10-CM

## 2024-03-03 ENCOUNTER — Ambulatory Visit: Admit: 2024-03-03 | Payer: Medicare (Managed Care)

## 2024-03-03 DIAGNOSIS — A498 Other bacterial infections of unspecified site: Principal | ICD-10-CM

## 2024-03-06 MED ORDER — AMPICILLIN IVPB 2 G/100 ML NS MINI-BAG PLUS (COMPONENTS)
Freq: Two times a day (BID) | INTRAVENOUS | 0 refills | 11.00000 days | Status: CP
Start: 2024-03-06 — End: 2024-03-17

## 2024-03-07 MED ORDER — CHLORTHALIDONE 25 MG TABLET
ORAL_TABLET | Freq: Every day | ORAL | 0 refills | 90.00000 days | Status: CP
Start: 2024-03-07 — End: 2024-06-05

## 2024-03-08 ENCOUNTER — Emergency Department
Admit: 2024-03-08 | Discharge: 2024-03-08 | Disposition: A | Discharge status: Home / Self Care | Payer: Medicare (Managed Care)

## 2024-03-08 DIAGNOSIS — Z7689 Persons encountering health services in other specified circumstances: Principal | ICD-10-CM

## 2024-03-08 DIAGNOSIS — R7881 Bacteremia: Principal | ICD-10-CM

## 2024-03-08 MED ORDER — LANTUS SOLOSTAR U-100 INSULIN 100 UNIT/ML (3 ML) SUBCUTANEOUS PEN
Freq: Every day | SUBCUTANEOUS | 2 refills | 30.00000 days | Status: CP
Start: 2024-03-08 — End: 2024-06-06

## 2024-03-11 ENCOUNTER — Emergency Department
Admit: 2024-03-11 | Discharge: 2024-03-11 | Disposition: A | Discharge status: Home Health | Payer: Medicare (Managed Care)

## 2024-03-11 DIAGNOSIS — Z09 Encounter for follow-up examination after completed treatment for conditions other than malignant neoplasm: Principal | ICD-10-CM

## 2024-03-13 ENCOUNTER — Emergency Department
Admit: 2024-03-13 | Discharge: 2024-03-14 | Disposition: A | Discharge status: Home / Self Care | Payer: Medicare (Managed Care)

## 2024-03-13 DIAGNOSIS — M542 Cervicalgia: Principal | ICD-10-CM

## 2024-03-17 ENCOUNTER — Encounter: Admit: 2024-03-17 | Discharge: 2024-03-17 | Payer: Medicare (Managed Care)

## 2024-03-18 MED ORDER — ATORVASTATIN 10 MG TABLET
ORAL_TABLET | Freq: Every day | ORAL | 0 refills | 0.00000 days
Start: 2024-03-18 — End: ?

## 2024-03-18 MED ORDER — ESTRADIOL 0.01% (0.1 MG/GRAM) VAGINAL CREAM
0 refills | 0.00000 days
Start: 2024-03-18 — End: ?

## 2024-03-18 MED ORDER — CARVEDILOL 25 MG TABLET
ORAL_TABLET | ORAL | 0 refills | 0.00000 days
Start: 2024-03-18 — End: ?

## 2024-03-19 DIAGNOSIS — Z940 Kidney transplant status: Principal | ICD-10-CM

## 2024-03-19 MED ORDER — CARVEDILOL 25 MG TABLET
ORAL_TABLET | ORAL | 0 refills | 0.00000 days | Status: CP
Start: 2024-03-19 — End: ?

## 2024-03-19 MED ORDER — ATORVASTATIN 10 MG TABLET
ORAL_TABLET | Freq: Every day | ORAL | 0 refills | 90.00000 days | Status: CP
Start: 2024-03-19 — End: ?

## 2024-03-20 ENCOUNTER — Ambulatory Visit
Admit: 2024-03-20 | Discharge: 2024-03-21 | Payer: Medicare (Managed Care) | Attending: Diagnostic Radiology | Primary: Diagnostic Radiology

## 2024-03-20 DIAGNOSIS — Z940 Kidney transplant status: Principal | ICD-10-CM

## 2024-03-21 MED ORDER — ESTRADIOL 0.01% (0.1 MG/GRAM) VAGINAL CREAM
0 refills | 0.00000 days
Start: 2024-03-21 — End: ?

## 2024-03-26 DIAGNOSIS — Z940 Kidney transplant status: Principal | ICD-10-CM

## 2024-03-27 MED ORDER — ESTRADIOL 0.01% (0.1 MG/GRAM) VAGINAL CREAM
0 refills | 0.00000 days
Start: 2024-03-27 — End: ?

## 2024-03-31 DIAGNOSIS — Z940 Kidney transplant status: Principal | ICD-10-CM

## 2024-03-31 DIAGNOSIS — Z796 Long term current use of immunosuppressive drug: Principal | ICD-10-CM

## 2024-04-03 ENCOUNTER — Ambulatory Visit
Admit: 2024-04-03 | Discharge: 2024-04-11 | Disposition: A | Discharge status: Home Health | Payer: Medicare (Managed Care)

## 2024-04-03 ENCOUNTER — Ambulatory Visit: Admit: 2024-04-03 | Payer: Medicare (Managed Care)

## 2024-04-03 ENCOUNTER — Ambulatory Visit: Admit: 2024-04-03 | Discharge: 2024-04-11 | Payer: Medicare (Managed Care)

## 2024-04-03 ENCOUNTER — Ambulatory Visit
Admit: 2024-04-03 | Discharge: 2024-04-11 | Disposition: A | Discharge status: Home / Self Care | Payer: Medicare (Managed Care)

## 2024-04-03 ENCOUNTER — Inpatient Hospital Stay
Admit: 2024-04-03 | Discharge: 2024-04-11 | Disposition: A | Discharge status: Home Health | Payer: Medicare (Managed Care)

## 2024-04-07 DIAGNOSIS — N390 Urinary tract infection, site not specified: Principal | ICD-10-CM

## 2024-04-07 MED ORDER — FOSFOMYCIN TROMETHAMINE 3 GRAM ORAL PACKET
ORAL | 11 refills | 0.00000 days | Status: CP
Start: 2024-04-07 — End: 2025-04-06

## 2024-04-11 ENCOUNTER — Ambulatory Visit: Admit: 2024-04-11 | Discharge: 2024-04-12 | Payer: Medicare (Managed Care)

## 2024-04-11 MED ORDER — LYUMJEV U-100 INSULIN 100 UNIT/ML SUBCUTANEOUS SOLUTION
0 refills | 0.00000 days | Status: CP
Start: 2024-04-11 — End: ?

## 2024-04-11 MED ORDER — ERTAPENEM (INVANZ) IN 50 ML NS IVPB
INTRAVENOUS | 0 refills | 0.00000 days
Start: 2024-04-11 — End: 2024-05-22

## 2024-04-11 MED ORDER — MYCOPHENOLATE MOFETIL 250 MG CAPSULE
ORAL_CAPSULE | Freq: Two times a day (BID) | ORAL | 3 refills | 90.00000 days | Status: CP
Start: 2024-04-11 — End: 2025-04-11

## 2024-04-17 DIAGNOSIS — N390 Urinary tract infection, site not specified: Principal | ICD-10-CM

## 2024-04-28 DIAGNOSIS — Z87448 Personal history of other diseases of urinary system: Principal | ICD-10-CM

## 2024-04-28 DIAGNOSIS — N390 Urinary tract infection, site not specified: Principal | ICD-10-CM

## 2024-04-28 DIAGNOSIS — Z796 Long term current use of immunosuppressive drug: Principal | ICD-10-CM

## 2024-04-28 DIAGNOSIS — Z940 Kidney transplant status: Principal | ICD-10-CM

## 2024-04-28 MED ORDER — FOSFOMYCIN TROMETHAMINE 3 GRAM ORAL PACKET
ORAL | 3 refills | 0.00000 days | Status: CP
Start: 2024-04-28 — End: ?

## 2024-04-29 ENCOUNTER — Encounter: Admit: 2024-04-29 | Discharge: 2024-04-30 | Payer: Medicare (Managed Care)

## 2024-04-30 DIAGNOSIS — N390 Urinary tract infection, site not specified: Principal | ICD-10-CM

## 2024-05-01 MED ORDER — PREDNISONE 5 MG TABLET
ORAL_TABLET | Freq: Every morning | ORAL | 0 refills | 0.00000 days
Start: 2024-05-01 — End: ?

## 2024-05-01 MED ORDER — ESTRADIOL 0.01% (0.1 MG/GRAM) VAGINAL CREAM
0 refills | 0.00000 days
Start: 2024-05-01 — End: ?

## 2024-05-02 MED ORDER — PREDNISONE 5 MG TABLET
ORAL_TABLET | Freq: Every morning | ORAL | 0 refills | 90.00000 days | Status: CP
Start: 2024-05-02 — End: ?

## 2024-05-07 MED ORDER — ESTRADIOL 0.01% (0.1 MG/GRAM) VAGINAL CREAM
0 refills | 0.00000 days
Start: 2024-05-07 — End: ?

## 2024-05-13 DIAGNOSIS — I151 Hypertension secondary to other renal disorders: Principal | ICD-10-CM

## 2024-05-13 DIAGNOSIS — Z940 Kidney transplant status: Principal | ICD-10-CM

## 2024-05-13 MED ORDER — HYDRALAZINE 50 MG TABLET
ORAL_TABLET | Freq: Three times a day (TID) | ORAL | 3 refills | 90.00000 days | Status: CP
Start: 2024-05-13 — End: 2025-05-13

## 2024-05-13 MED ORDER — CARVEDILOL 25 MG TABLET
ORAL_TABLET | Freq: Two times a day (BID) | ORAL | 3 refills | 90.00000 days | Status: CP
Start: 2024-05-13 — End: ?

## 2024-05-13 MED ORDER — LEVOTHYROXINE 112 MCG TABLET
ORAL_TABLET | Freq: Every day | ORAL | 3 refills | 90.00000 days | Status: CP
Start: 2024-05-13 — End: ?

## 2024-05-13 MED ORDER — ATORVASTATIN 10 MG TABLET
ORAL_TABLET | Freq: Every day | ORAL | 3 refills | 90.00000 days | Status: CP
Start: 2024-05-13 — End: ?

## 2024-05-13 MED ORDER — MYCOPHENOLATE MOFETIL 250 MG CAPSULE
ORAL_CAPSULE | Freq: Two times a day (BID) | ORAL | 3 refills | 90.00000 days | Status: CP
Start: 2024-05-13 — End: 2025-05-13

## 2024-05-13 MED ORDER — AMLODIPINE 10 MG TABLET
ORAL_TABLET | Freq: Every day | ORAL | 3 refills | 90.00000 days | Status: CP
Start: 2024-05-13 — End: ?

## 2024-05-13 MED ORDER — CHOLECALCIFEROL (VITAMIN D3) 25 MCG (1,000 UNIT) TABLET
ORAL_TABLET | Freq: Every day | ORAL | 3 refills | 90.00000 days | Status: CP
Start: 2024-05-13 — End: ?

## 2024-05-13 MED ORDER — PEN NEEDLE, DIABETIC 32 GAUGE X 5/32" (4 MM)
ORAL | 11 refills | 0.00000 days | Status: CP
Start: 2024-05-13 — End: ?

## 2024-05-13 MED ORDER — PREDNISONE 5 MG TABLET
ORAL_TABLET | Freq: Every morning | ORAL | 3 refills | 90.00000 days | Status: CP
Start: 2024-05-13 — End: ?

## 2024-05-13 MED ORDER — SPIRONOLACTONE 50 MG TABLET
ORAL_TABLET | Freq: Every day | ORAL | 3 refills | 90.00000 days | Status: CP
Start: 2024-05-13 — End: 2025-05-13

## 2024-05-13 MED ORDER — CHLORTHALIDONE 25 MG TABLET
ORAL_TABLET | Freq: Every day | ORAL | 0 refills | 90.00000 days | Status: CP
Start: 2024-05-13 — End: 2024-08-11

## 2024-05-13 MED ORDER — PANTOPRAZOLE 40 MG TABLET,DELAYED RELEASE
ORAL_TABLET | Freq: Every day | ORAL | 3 refills | 90.00000 days | Status: CP
Start: 2024-05-13 — End: ?

## 2024-05-15 MED ORDER — ESTRADIOL 0.01% (0.1 MG/GRAM) VAGINAL CREAM
VAGINAL | 3 refills | 84.00000 days | Status: CP
Start: 2024-05-15 — End: ?

## 2024-05-16 ENCOUNTER — Ambulatory Visit: Admit: 2024-05-16 | Discharge: 2024-05-17 | Payer: Medicare (Managed Care)

## 2024-05-22 ENCOUNTER — Encounter: Admit: 2024-05-22 | Discharge: 2024-05-22 | Payer: Medicare (Managed Care)

## 2024-05-22 DIAGNOSIS — Z87448 Personal history of other diseases of urinary system: Principal | ICD-10-CM

## 2024-05-22 DIAGNOSIS — Z796 Long term current use of immunosuppressive drug: Principal | ICD-10-CM

## 2024-05-22 DIAGNOSIS — Z940 Kidney transplant status: Principal | ICD-10-CM

## 2024-05-22 DIAGNOSIS — N390 Urinary tract infection, site not specified: Principal | ICD-10-CM

## 2024-05-22 MED ORDER — FOSFOMYCIN TROMETHAMINE 3 GRAM ORAL PACKET
ORAL | 3 refills | 0.00000 days | Status: CP
Start: 2024-05-22 — End: ?

## 2024-05-22 MED ORDER — METHENAMINE HIPPURATE 1 GRAM TABLET
ORAL_TABLET | Freq: Two times a day (BID) | ORAL | 11 refills | 30.00000 days | Status: CP
Start: 2024-05-22 — End: 2025-05-22

## 2024-05-26 MED ORDER — BASAGLAR KWIKPEN U-100 INSULIN 100 UNIT/ML (3 ML) SUBCUTANEOUS
0 refills | 0.00000 days
Start: 2024-05-26 — End: ?

## 2024-05-27 DIAGNOSIS — N390 Urinary tract infection, site not specified: Principal | ICD-10-CM

## 2024-05-27 DIAGNOSIS — Z940 Kidney transplant status: Principal | ICD-10-CM

## 2024-05-27 MED ORDER — BASAGLAR KWIKPEN U-100 INSULIN 100 UNIT/ML (3 ML) SUBCUTANEOUS
0 refills | 0.00000 days
Start: 2024-05-27 — End: ?

## 2024-05-28 DIAGNOSIS — Z940 Kidney transplant status: Principal | ICD-10-CM

## 2024-06-02 DIAGNOSIS — N390 Urinary tract infection, site not specified: Principal | ICD-10-CM

## 2024-06-02 DIAGNOSIS — Z940 Kidney transplant status: Principal | ICD-10-CM

## 2024-06-09 DIAGNOSIS — Z940 Kidney transplant status: Principal | ICD-10-CM

## 2024-06-09 DIAGNOSIS — N390 Urinary tract infection, site not specified: Principal | ICD-10-CM

## 2024-06-10 ENCOUNTER — Encounter: Admit: 2024-06-10 | Discharge: 2024-06-10 | Payer: Medicare (Managed Care)

## 2024-06-10 ENCOUNTER — Ambulatory Visit: Admit: 2024-06-10 | Discharge: 2024-06-10 | Payer: Medicare (Managed Care)

## 2024-06-10 DIAGNOSIS — N390 Urinary tract infection, site not specified: Principal | ICD-10-CM

## 2024-06-10 DIAGNOSIS — Z940 Kidney transplant status: Principal | ICD-10-CM

## 2024-06-16 DIAGNOSIS — Z940 Kidney transplant status: Principal | ICD-10-CM

## 2024-06-16 DIAGNOSIS — N390 Urinary tract infection, site not specified: Principal | ICD-10-CM

## 2024-06-16 DIAGNOSIS — Z87448 Personal history of other diseases of urinary system: Principal | ICD-10-CM

## 2024-06-23 DIAGNOSIS — Z940 Kidney transplant status: Principal | ICD-10-CM

## 2024-06-23 DIAGNOSIS — N390 Urinary tract infection, site not specified: Principal | ICD-10-CM

## 2024-06-23 DIAGNOSIS — Z87448 Personal history of other diseases of urinary system: Principal | ICD-10-CM

## 2024-06-24 DIAGNOSIS — Z940 Kidney transplant status: Principal | ICD-10-CM

## 2024-06-24 DIAGNOSIS — Z796 Long term current use of immunosuppressive drug: Principal | ICD-10-CM

## 2024-06-24 DIAGNOSIS — Z794 Long term (current) use of insulin: Principal | ICD-10-CM

## 2024-06-24 DIAGNOSIS — E1122 Type 2 diabetes mellitus with diabetic chronic kidney disease: Principal | ICD-10-CM

## 2024-06-27 ENCOUNTER — Ambulatory Visit: Admit: 2024-06-27 | Discharge: 2024-06-28 | Payer: Medicare (Managed Care)

## 2024-06-29 ENCOUNTER — Ambulatory Visit: Admit: 2024-06-29 | Discharge: 2024-07-15 | Payer: Medicare (Managed Care)

## 2024-06-29 ENCOUNTER — Ambulatory Visit: Admit: 2024-06-29 | Payer: Medicare (Managed Care)

## 2024-06-29 ENCOUNTER — Ambulatory Visit
Admission: EM | Admit: 2024-06-29 | Discharge: 2024-07-15 | Disposition: A | Discharge status: Home / Self Care | Payer: Medicare (Managed Care)

## 2024-06-29 ENCOUNTER — Ambulatory Visit: Payer: Medicare (Managed Care)

## 2024-06-29 ENCOUNTER — Inpatient Hospital Stay
Admission: EM | Admit: 2024-06-29 | Discharge: 2024-07-15 | Disposition: A | Discharge status: Home / Self Care | Payer: Medicare (Managed Care)

## 2024-06-30 DIAGNOSIS — Z87448 Personal history of other diseases of urinary system: Principal | ICD-10-CM

## 2024-06-30 DIAGNOSIS — Z940 Kidney transplant status: Principal | ICD-10-CM

## 2024-06-30 DIAGNOSIS — N390 Urinary tract infection, site not specified: Principal | ICD-10-CM

## 2024-07-02 DIAGNOSIS — Z796 Long term current use of immunosuppressive drug: Principal | ICD-10-CM

## 2024-07-02 DIAGNOSIS — N390 Urinary tract infection, site not specified: Principal | ICD-10-CM

## 2024-07-02 DIAGNOSIS — Z940 Kidney transplant status: Principal | ICD-10-CM

## 2024-07-07 DIAGNOSIS — Z940 Kidney transplant status: Principal | ICD-10-CM

## 2024-07-07 DIAGNOSIS — N390 Urinary tract infection, site not specified: Principal | ICD-10-CM

## 2024-07-07 DIAGNOSIS — Z87448 Personal history of other diseases of urinary system: Principal | ICD-10-CM

## 2024-07-14 DIAGNOSIS — Z940 Kidney transplant status: Principal | ICD-10-CM

## 2024-07-14 DIAGNOSIS — Z87448 Personal history of other diseases of urinary system: Principal | ICD-10-CM

## 2024-07-14 DIAGNOSIS — N390 Urinary tract infection, site not specified: Principal | ICD-10-CM

## 2024-07-14 MED ORDER — CARVEDILOL 12.5 MG TABLET
ORAL_TABLET | Freq: Two times a day (BID) | ORAL | 0 refills | 30.00000 days | Status: CP
Start: 2024-07-14 — End: 2024-10-12

## 2024-07-14 MED ORDER — LANTUS SOLOSTAR U-100 INSULIN 100 UNIT/ML (3 ML) SUBCUTANEOUS PEN
Freq: Every evening | SUBCUTANEOUS | 0 refills | 30.00000 days | Status: CP
Start: 2024-07-14 — End: 2024-10-12

## 2024-07-15 MED ORDER — FOSFOMYCIN TROMETHAMINE 3 GRAM ORAL PACKET
ORAL | 0 refills | 28.00000 days | Status: CP
Start: 2024-07-15 — End: 2024-08-14

## 2024-07-21 DIAGNOSIS — Z940 Kidney transplant status: Principal | ICD-10-CM

## 2024-07-21 DIAGNOSIS — N390 Urinary tract infection, site not specified: Principal | ICD-10-CM

## 2024-07-21 DIAGNOSIS — Z87448 Personal history of other diseases of urinary system: Principal | ICD-10-CM

## 2024-07-22 ENCOUNTER — Encounter: Admit: 2024-07-22 | Discharge: 2024-07-23 | Payer: Medicare (Managed Care)

## 2024-07-22 DIAGNOSIS — Z796 Long term current use of immunosuppressive drug: Principal | ICD-10-CM

## 2024-07-22 DIAGNOSIS — Z940 Kidney transplant status: Principal | ICD-10-CM

## 2024-07-22 DIAGNOSIS — Z87448 Personal history of other diseases of urinary system: Principal | ICD-10-CM

## 2024-07-22 DIAGNOSIS — N390 Urinary tract infection, site not specified: Principal | ICD-10-CM

## 2024-07-22 MED ORDER — FOSFOMYCIN TROMETHAMINE 3 GRAM ORAL PACKET
ORAL | 5 refills | 28.00000 days | Status: CP
Start: 2024-07-22 — End: 2025-01-06

## 2024-07-28 DIAGNOSIS — N390 Urinary tract infection, site not specified: Principal | ICD-10-CM

## 2024-07-28 DIAGNOSIS — Z940 Kidney transplant status: Principal | ICD-10-CM

## 2024-07-28 DIAGNOSIS — Z87448 Personal history of other diseases of urinary system: Principal | ICD-10-CM

## 2024-07-29 DIAGNOSIS — Z796 Long term current use of immunosuppressive drug: Principal | ICD-10-CM

## 2024-07-29 DIAGNOSIS — Z79899 Other long term (current) drug therapy: Principal | ICD-10-CM

## 2024-07-29 DIAGNOSIS — N186 End stage renal disease: Principal | ICD-10-CM

## 2024-07-29 DIAGNOSIS — Z940 Kidney transplant status: Principal | ICD-10-CM

## 2024-07-30 MED ORDER — DEXCOM G7 SENSOR DEVICE
0 refills | 30.00000 days | Status: CP
Start: 2024-07-30 — End: ?

## 2024-08-01 ENCOUNTER — Ambulatory Visit: Admit: 2024-08-01 | Discharge: 2024-08-01 | Payer: Medicare (Managed Care) | Attending: Nephrology | Primary: Nephrology

## 2024-08-01 ENCOUNTER — Other Ambulatory Visit: Admit: 2024-08-01 | Discharge: 2024-08-01 | Payer: Medicare (Managed Care)

## 2024-08-01 DIAGNOSIS — Z940 Kidney transplant status: Principal | ICD-10-CM

## 2024-08-01 DIAGNOSIS — Z79899 Other long term (current) drug therapy: Principal | ICD-10-CM

## 2024-08-01 DIAGNOSIS — Z796 Long term current use of immunosuppressive drug: Principal | ICD-10-CM

## 2024-08-01 DIAGNOSIS — I1A0 Resistant hypertension: Principal | ICD-10-CM

## 2024-08-01 DIAGNOSIS — N186 End stage renal disease: Principal | ICD-10-CM

## 2024-08-01 DIAGNOSIS — N390 Urinary tract infection, site not specified: Principal | ICD-10-CM

## 2024-08-01 DIAGNOSIS — I151 Hypertension secondary to other renal disorders: Principal | ICD-10-CM

## 2024-08-01 MED ORDER — SPIRONOLACTONE 50 MG TABLET
ORAL_TABLET | Freq: Every day | ORAL | 3 refills | 90.00000 days | Status: CP
Start: 2024-08-01 — End: 2025-08-01

## 2024-08-01 MED ORDER — CARVEDILOL 25 MG TABLET
ORAL_TABLET | Freq: Two times a day (BID) | ORAL | 3 refills | 90.00000 days | Status: CP
Start: 2024-08-01 — End: 2025-08-01

## 2024-08-01 MED ORDER — MYCOPHENOLATE MOFETIL 250 MG CAPSULE
ORAL_CAPSULE | Freq: Two times a day (BID) | ORAL | 3 refills | 90.00000 days | Status: CP
Start: 2024-08-01 — End: 2025-08-01

## 2024-08-04 DIAGNOSIS — Z87448 Personal history of other diseases of urinary system: Principal | ICD-10-CM

## 2024-08-04 DIAGNOSIS — N390 Urinary tract infection, site not specified: Principal | ICD-10-CM

## 2024-08-04 DIAGNOSIS — Z940 Kidney transplant status: Principal | ICD-10-CM

## 2024-08-05 DIAGNOSIS — Z940 Kidney transplant status: Principal | ICD-10-CM

## 2024-08-11 DIAGNOSIS — Z87448 Personal history of other diseases of urinary system: Principal | ICD-10-CM

## 2024-08-11 DIAGNOSIS — Z940 Kidney transplant status: Principal | ICD-10-CM

## 2024-08-11 DIAGNOSIS — N390 Urinary tract infection, site not specified: Principal | ICD-10-CM

## 2024-08-18 DIAGNOSIS — Z940 Kidney transplant status: Principal | ICD-10-CM

## 2024-08-18 DIAGNOSIS — Z87448 Personal history of other diseases of urinary system: Secondary | ICD-10-CM

## 2024-08-18 DIAGNOSIS — N390 Urinary tract infection, site not specified: Secondary | ICD-10-CM

## 2024-08-22 ENCOUNTER — Ambulatory Visit: Admit: 2024-08-22 | Discharge: 2024-08-23 | Payer: Medicare (Managed Care)

## 2024-08-25 DIAGNOSIS — Z940 Kidney transplant status: Principal | ICD-10-CM

## 2024-08-25 DIAGNOSIS — N390 Urinary tract infection, site not specified: Secondary | ICD-10-CM

## 2024-08-25 DIAGNOSIS — Z87448 Personal history of other diseases of urinary system: Secondary | ICD-10-CM

## 2024-08-28 DIAGNOSIS — Z940 Kidney transplant status: Principal | ICD-10-CM

## 2024-08-28 MED ORDER — CHLORTHALIDONE 25 MG TABLET
ORAL_TABLET | Freq: Every day | ORAL | 0 refills | 90.00000 days
Start: 2024-08-28 — End: 2024-11-26

## 2024-08-30 MED ORDER — LEVOFLOXACIN 750 MG TABLET
ORAL_TABLET | ORAL | 0 refills | 10.00000 days
Start: 2024-08-30 — End: 2024-09-08
